# Patient Record
Sex: Male | Born: 1940
Health system: Southern US, Community
[De-identification: ages and names within clinical notes are randomized; demographics above are authoritative.]

## PROBLEM LIST (undated history)

## (undated) DIAGNOSIS — M1990 Unspecified osteoarthritis, unspecified site: Secondary | ICD-10-CM

## (undated) DIAGNOSIS — D649 Anemia, unspecified: Secondary | ICD-10-CM

## (undated) DIAGNOSIS — D696 Thrombocytopenia, unspecified: Secondary | ICD-10-CM

## (undated) DIAGNOSIS — Z9289 Personal history of other medical treatment: Secondary | ICD-10-CM

## (undated) DIAGNOSIS — I509 Heart failure, unspecified: Secondary | ICD-10-CM

## (undated) DIAGNOSIS — M545 Low back pain, unspecified: Secondary | ICD-10-CM

## (undated) DIAGNOSIS — I2510 Atherosclerotic heart disease of native coronary artery without angina pectoris: Secondary | ICD-10-CM

## (undated) DIAGNOSIS — K5790 Diverticulosis of intestine, part unspecified, without perforation or abscess without bleeding: Secondary | ICD-10-CM

## (undated) DIAGNOSIS — E538 Deficiency of other specified B group vitamins: Secondary | ICD-10-CM

## (undated) DIAGNOSIS — M79606 Pain in leg, unspecified: Secondary | ICD-10-CM

## (undated) DIAGNOSIS — E1140 Type 2 diabetes mellitus with diabetic neuropathy, unspecified: Secondary | ICD-10-CM

## (undated) DIAGNOSIS — I350 Nonrheumatic aortic (valve) stenosis: Secondary | ICD-10-CM

## (undated) DIAGNOSIS — Z8719 Personal history of other diseases of the digestive system: Secondary | ICD-10-CM

## (undated) DIAGNOSIS — I499 Cardiac arrhythmia, unspecified: Secondary | ICD-10-CM

## (undated) DIAGNOSIS — R011 Cardiac murmur, unspecified: Secondary | ICD-10-CM

## (undated) DIAGNOSIS — J309 Allergic rhinitis, unspecified: Secondary | ICD-10-CM

## (undated) DIAGNOSIS — U071 COVID-19: Secondary | ICD-10-CM

## (undated) DIAGNOSIS — I219 Acute myocardial infarction, unspecified: Secondary | ICD-10-CM

## (undated) DIAGNOSIS — I10 Essential (primary) hypertension: Secondary | ICD-10-CM

## (undated) DIAGNOSIS — Z87442 Personal history of urinary calculi: Secondary | ICD-10-CM

## (undated) DIAGNOSIS — G8929 Other chronic pain: Secondary | ICD-10-CM

## (undated) DIAGNOSIS — K863 Pseudocyst of pancreas: Secondary | ICD-10-CM

## (undated) DIAGNOSIS — R42 Dizziness and giddiness: Secondary | ICD-10-CM

## (undated) DIAGNOSIS — T8781 Dehiscence of amputation stump: Secondary | ICD-10-CM

## (undated) HISTORY — PX: CARPAL TUNNEL RELEASE: SHX101

## (undated) HISTORY — DX: Allergic rhinitis, unspecified: J30.9

## (undated) HISTORY — PX: COLONOSCOPY: SHX174

## (undated) HISTORY — DX: Personal history of other medical treatment: Z92.89

## (undated) HISTORY — DX: Diverticulosis of intestine, part unspecified, without perforation or abscess without bleeding: K57.90

## (undated) HISTORY — PX: BACK SURGERY: SHX140

## (undated) HISTORY — PX: KNEE ARTHROSCOPY: SHX127

## (undated) HISTORY — DX: Gilbert syndrome: E80.4

## (undated) HISTORY — PX: TONSILLECTOMY: SUR1361

## (undated) HISTORY — DX: Deficiency of other specified B group vitamins: E53.8

## (undated) HISTORY — DX: Type 2 diabetes mellitus with diabetic neuropathy, unspecified: E11.40

## (undated) HISTORY — DX: Personal history of other diseases of the digestive system: Z87.19

## (undated) HISTORY — PX: SHOULDER SURGERY: SHX246

## (undated) HISTORY — PX: CHOLECYSTECTOMY: SHX55

---

## 2001-01-09 ENCOUNTER — Encounter: Admission: RE | Admit: 2001-01-09 | Discharge: 2001-01-09 | Payer: Self-pay | Admitting: Internal Medicine

## 2001-01-09 ENCOUNTER — Encounter: Payer: Self-pay | Admitting: Internal Medicine

## 2001-02-24 ENCOUNTER — Encounter: Payer: Self-pay | Admitting: *Deleted

## 2001-02-24 ENCOUNTER — Ambulatory Visit (HOSPITAL_COMMUNITY): Admission: RE | Admit: 2001-02-24 | Discharge: 2001-02-24 | Payer: Self-pay | Admitting: *Deleted

## 2001-03-13 ENCOUNTER — Encounter: Admission: RE | Admit: 2001-03-13 | Discharge: 2001-03-13 | Payer: Self-pay | Admitting: *Deleted

## 2001-03-13 ENCOUNTER — Encounter: Payer: Self-pay | Admitting: *Deleted

## 2001-03-27 ENCOUNTER — Encounter: Payer: Self-pay | Admitting: *Deleted

## 2001-03-27 ENCOUNTER — Encounter: Admission: RE | Admit: 2001-03-27 | Discharge: 2001-03-27 | Payer: Self-pay | Admitting: *Deleted

## 2001-04-14 ENCOUNTER — Encounter: Admission: RE | Admit: 2001-04-14 | Discharge: 2001-04-14 | Payer: Self-pay | Admitting: *Deleted

## 2001-04-14 ENCOUNTER — Encounter: Payer: Self-pay | Admitting: *Deleted

## 2001-08-19 ENCOUNTER — Emergency Department (HOSPITAL_COMMUNITY): Admission: EM | Admit: 2001-08-19 | Discharge: 2001-08-19 | Payer: Self-pay | Admitting: Emergency Medicine

## 2001-08-19 ENCOUNTER — Encounter: Payer: Self-pay | Admitting: Emergency Medicine

## 2001-10-04 HISTORY — PX: INGUINAL HERNIA REPAIR: SUR1180

## 2001-10-09 ENCOUNTER — Encounter: Payer: Self-pay | Admitting: Internal Medicine

## 2001-10-09 ENCOUNTER — Encounter: Admission: RE | Admit: 2001-10-09 | Discharge: 2001-10-09 | Payer: Self-pay | Admitting: Internal Medicine

## 2001-11-10 ENCOUNTER — Encounter: Payer: Self-pay | Admitting: Surgery

## 2001-11-10 ENCOUNTER — Encounter: Admission: RE | Admit: 2001-11-10 | Discharge: 2001-11-10 | Payer: Self-pay | Admitting: Surgery

## 2001-11-13 ENCOUNTER — Ambulatory Visit (HOSPITAL_BASED_OUTPATIENT_CLINIC_OR_DEPARTMENT_OTHER): Admission: RE | Admit: 2001-11-13 | Discharge: 2001-11-14 | Payer: Self-pay | Admitting: Surgery

## 2002-01-25 ENCOUNTER — Encounter: Payer: Self-pay | Admitting: *Deleted

## 2002-01-25 ENCOUNTER — Encounter: Payer: Self-pay | Admitting: Internal Medicine

## 2002-01-25 ENCOUNTER — Observation Stay (HOSPITAL_COMMUNITY): Admission: EM | Admit: 2002-01-25 | Discharge: 2002-01-26 | Payer: Self-pay | Admitting: *Deleted

## 2002-04-03 ENCOUNTER — Inpatient Hospital Stay (HOSPITAL_COMMUNITY): Admission: RE | Admit: 2002-04-03 | Discharge: 2002-04-07 | Payer: Self-pay | Admitting: *Deleted

## 2002-04-03 HISTORY — PX: JOINT REPLACEMENT: SHX530

## 2003-01-08 ENCOUNTER — Encounter: Payer: Self-pay | Admitting: Internal Medicine

## 2003-01-08 ENCOUNTER — Inpatient Hospital Stay (HOSPITAL_COMMUNITY): Admission: EM | Admit: 2003-01-08 | Discharge: 2003-01-11 | Payer: Self-pay | Admitting: Emergency Medicine

## 2003-01-09 ENCOUNTER — Encounter: Payer: Self-pay | Admitting: Internal Medicine

## 2003-02-14 ENCOUNTER — Ambulatory Visit (HOSPITAL_COMMUNITY): Admission: RE | Admit: 2003-02-14 | Discharge: 2003-02-15 | Payer: Self-pay | Admitting: Interventional Cardiology

## 2003-07-23 ENCOUNTER — Encounter: Payer: Self-pay | Admitting: Emergency Medicine

## 2003-07-23 ENCOUNTER — Inpatient Hospital Stay (HOSPITAL_COMMUNITY): Admission: EM | Admit: 2003-07-23 | Discharge: 2003-07-31 | Payer: Self-pay | Admitting: Emergency Medicine

## 2003-08-11 ENCOUNTER — Inpatient Hospital Stay (HOSPITAL_COMMUNITY): Admission: EM | Admit: 2003-08-11 | Discharge: 2003-08-14 | Payer: Self-pay | Admitting: Emergency Medicine

## 2003-08-13 ENCOUNTER — Encounter (INDEPENDENT_AMBULATORY_CARE_PROVIDER_SITE_OTHER): Payer: Self-pay | Admitting: *Deleted

## 2004-07-17 ENCOUNTER — Encounter: Admission: RE | Admit: 2004-07-17 | Discharge: 2004-07-17 | Payer: Self-pay | Admitting: Internal Medicine

## 2004-10-27 ENCOUNTER — Encounter: Admission: RE | Admit: 2004-10-27 | Discharge: 2004-10-27 | Payer: Self-pay | Admitting: Orthopaedic Surgery

## 2004-11-16 ENCOUNTER — Encounter: Admission: RE | Admit: 2004-11-16 | Discharge: 2004-11-16 | Payer: Self-pay | Admitting: Orthopaedic Surgery

## 2004-12-15 ENCOUNTER — Encounter: Admission: RE | Admit: 2004-12-15 | Discharge: 2004-12-15 | Payer: Self-pay | Admitting: Internal Medicine

## 2005-01-21 ENCOUNTER — Encounter: Admission: RE | Admit: 2005-01-21 | Discharge: 2005-01-21 | Payer: Self-pay | Admitting: Orthopaedic Surgery

## 2005-02-21 ENCOUNTER — Ambulatory Visit (HOSPITAL_COMMUNITY): Admission: RE | Admit: 2005-02-21 | Discharge: 2005-02-21 | Payer: Self-pay | Admitting: Orthopaedic Surgery

## 2005-02-23 ENCOUNTER — Encounter: Admission: RE | Admit: 2005-02-23 | Discharge: 2005-02-23 | Payer: Self-pay | Admitting: General Surgery

## 2005-03-22 ENCOUNTER — Ambulatory Visit (HOSPITAL_COMMUNITY): Admission: RE | Admit: 2005-03-22 | Discharge: 2005-03-23 | Payer: Self-pay | Admitting: Orthopaedic Surgery

## 2005-05-07 ENCOUNTER — Inpatient Hospital Stay (HOSPITAL_COMMUNITY): Admission: RE | Admit: 2005-05-07 | Discharge: 2005-05-12 | Payer: Self-pay | Admitting: General Surgery

## 2005-05-07 ENCOUNTER — Encounter (INDEPENDENT_AMBULATORY_CARE_PROVIDER_SITE_OTHER): Payer: Self-pay | Admitting: Specialist

## 2005-07-09 ENCOUNTER — Encounter: Admission: RE | Admit: 2005-07-09 | Discharge: 2005-07-09 | Payer: Self-pay | Admitting: General Surgery

## 2005-07-12 ENCOUNTER — Ambulatory Visit (HOSPITAL_COMMUNITY): Admission: RE | Admit: 2005-07-12 | Discharge: 2005-07-12 | Payer: Self-pay | Admitting: Orthopaedic Surgery

## 2005-07-23 ENCOUNTER — Ambulatory Visit (HOSPITAL_COMMUNITY): Admission: RE | Admit: 2005-07-23 | Discharge: 2005-07-24 | Payer: Self-pay | Admitting: Orthopaedic Surgery

## 2007-06-10 ENCOUNTER — Ambulatory Visit (HOSPITAL_COMMUNITY): Admission: RE | Admit: 2007-06-10 | Discharge: 2007-06-10 | Payer: Self-pay | Admitting: Orthopaedic Surgery

## 2007-06-22 ENCOUNTER — Encounter: Admission: RE | Admit: 2007-06-22 | Discharge: 2007-06-22 | Payer: Self-pay | Admitting: Orthopaedic Surgery

## 2007-07-17 ENCOUNTER — Ambulatory Visit (HOSPITAL_COMMUNITY): Admission: RE | Admit: 2007-07-17 | Discharge: 2007-07-18 | Payer: Self-pay | Admitting: Orthopaedic Surgery

## 2007-09-18 ENCOUNTER — Encounter (INDEPENDENT_AMBULATORY_CARE_PROVIDER_SITE_OTHER): Payer: Self-pay | Admitting: Cardiology

## 2007-09-18 ENCOUNTER — Ambulatory Visit (HOSPITAL_COMMUNITY): Admission: RE | Admit: 2007-09-18 | Discharge: 2007-09-18 | Payer: Self-pay | Admitting: Cardiology

## 2007-09-18 ENCOUNTER — Ambulatory Visit: Payer: Self-pay | Admitting: *Deleted

## 2008-05-05 ENCOUNTER — Encounter: Admission: RE | Admit: 2008-05-05 | Discharge: 2008-05-05 | Payer: Self-pay | Admitting: Orthopaedic Surgery

## 2008-05-16 ENCOUNTER — Encounter: Admission: RE | Admit: 2008-05-16 | Discharge: 2008-05-16 | Payer: Self-pay | Admitting: Orthopaedic Surgery

## 2008-12-06 ENCOUNTER — Encounter: Admission: RE | Admit: 2008-12-06 | Discharge: 2008-12-06 | Payer: Self-pay | Admitting: Orthopaedic Surgery

## 2009-01-22 ENCOUNTER — Encounter: Admission: RE | Admit: 2009-01-22 | Discharge: 2009-04-22 | Payer: Self-pay | Admitting: Orthopaedic Surgery

## 2010-10-24 ENCOUNTER — Encounter: Payer: Self-pay | Admitting: Orthopaedic Surgery

## 2010-10-26 ENCOUNTER — Encounter: Payer: Self-pay | Admitting: Orthopaedic Surgery

## 2011-02-16 NOTE — Op Note (Signed)
NAME:  Keith Espinoza, Keith Espinoza                ACCOUNT NO.:  [id number]   MEDICAL RECORD NO.:  [id number]          PATIENT TYPE:  OIB   LOCATION:  5011                         FACILITY:  [facility]   PHYSICIAN:  Mark C. Ophelia Charter, M.D.    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   PROCEDURE:  Right L4-5 microdiskectomy for recurrent herniated nucleus  pulposus.   SURGEON:  Mark C. Ophelia Charter, MD   ANESTHESIA:  GOT.   ESTIMATED BLOOD LOSS:  Less than 100 mL.   ASSISTANT:  Maud Deed, PA-C   BRIEF HISTORY:  A 70 year old male status post microdiskectomy 2 years  ago.  He has been playing golf 3 days a week until 2 months ago, when he  developed severe progressive right buttock pain, right lower extremity  pain, anterior tibialis weakness, EHL weakness, and a myelogram CT scan  showed a large recurrent HNP at the level of the disk on the right side  causing pseudostenosis from a fragment greater than 1.5 cm.   PROCEDURE:  After the patient had the induction of anesthesia, the  patient was placed on chest rolls, prone position, careful padding and  positioning.  The back was prepped with DuraPrep, squared with towels,  Betadine Vi-Drape applied and a laminectomy sheet and drapes.  Old scar  was opened and subcutaneous tissue was subperiosteally dissected and a  Penfield #4 was placed down into the depths overlying the lamina level  and an x-ray was taken, which showed it was over the upper half of the  L4 pedicle above the space.  Exposure was adjusted and an old laminotomy  defect was visualized.  Bone was removed on the right at L4 and  inferiorly at L5 until normal dura was found in both areas, a patty used  to protect the dura and then following this from  cephalad to caudad  following along the lateral gutter, scar was separated from the bone.  Bone was removed further rout lateral along the edge of the pedicle  using the Karlin curettes for the microdissection.  Once the ligamentum  was elevated proximally in the area of normal dura, a ball-tip nerve  hook was used to gently separate the dura from the ligamentum, and it  was removed with a 2-mm Kerrison.  Microdissection techniques using a  Cushing 15 scalpel was used to excise chunks of scar tissue with normal  dura above and below.  Finally the floor was reached.  Disk space was  noted with the nerve root scarred down with the dura to the midline.  Annulus was incised with a stab and passes made with a micropituitary  and then a regular pituitary and initially a small amount of disk was  removed.  Suddenly over the next pass a massive fragment was removed,  which 2 x 1 cm, followed by another fragment about half that size,  and  then the nerve root was free.  Dura was able to be mobilized easily.  A  hockey stick was placed anterior to the dura.  The pocket where the  fragment was present was noted.  The edges were excised.  The nerve root  was still stuck down slightly distally; however, with dura decompressed,  the fragment removed, no areas of  remaining compression.  This area down distal was left.  Myelo CT showed  that there was no spurring causing compression and with the dura and  nerve root decompressed, the wound was irrigated.  The fascia was closed  with 0 Vicryl and 2-0 on the subcutaneous tissue and then a skin  closure.  Instrument count and needle count was correct.      Mark C. Ophelia Charter, M.D.  Electronically Signed     MCY/MEDQ  D:  07/17/2007  T:  07/18/2007  Job:  [id number]

## 2011-02-19 NOTE — H&P (Signed)
NAME:  KRISTOFF, COONRADT NO.:  [id number]   MEDICAL RECORD NO.:  [id number]                   PATIENT TYPE:  INP   LOCATION:  1826                                 FACILITY:  [facility]   PHYSICIAN:  W. Ashley Royalty., M.D.         DATE OF BIRTH:  January 14, 1941   DATE OF ADMISSION:  08/11/2003  DATE OF DISCHARGE:                                HISTORY & PHYSICAL   HISTORY OF PRESENT ILLNESS:  A 70 year old male who has a prior history of  coronary artery disease. Earlier this year, he presented with unstable  angina and had drug eluding Cipher stents placed to the mid right coronary  artery, LAD and circumflex by Dr. Verdis Prime. The date of the last Stent  implantation in the LAD and the circumflex was Feb 14, 2003. He also has  hypertension, diabetes, dyslipidemia, obesity, osteoarthritis and gout. He  was admitted 3 weeks ago with gallstone pancreatitis. The pancreatitis was  thought to be resolved but he was very weak and he was discharged on July 31, 2003 to have a laparoscopic cholecystectomy when he was stronger. He was  taken off of Plavix during the most recent hospitalization in anticipation  of upcoming surgery. After breakfast this morning, he developed lower  sternal and right pain which is described as a pressure feeling associated  with diaphoresis lasting around 20 minutes. It was different in location  from his previous coronary pain, which was more mid sternal. This seems to  more localize to the upper right upper quadrant and lower sternal area and  lasts around 20 minutes. He took nitroglycerin and came to the emergency  room where he was having vague symptoms. Initial enzymes and  electrocardiogram  were unremarkable. The cardiologist was called and he was  admitted to rule out  a myocardial infarction.   PAST MEDICAL HISTORY:  Remarkable for hypertension, hyperlipidemia, type 2  diabetes, obesity, gout and osteoarthritis as well as  nephrolithiasis.   PAST SURGICAL HISTORY:  He has had a hip arthroplasty as well as a hernia  repair. He has also had arthroscopic knee surgery previously.   ALLERGIES:  None.   CURRENT MEDICATIONS:  Pravachol 80 daily, Lotensin 10 daily, Allopurinol 300  mg daily, Amaryl 4 mg daily, Hytrin 5 mg daily, Micardis 80 mg daily, and  Nitroglycerin p.r.n.   SOCIAL/FAMILY HISTORY:  Reviewed from an old record dated October 18,2004.  Reviewed and are unchanged.   REVIEW OF SYSTEMS:  His weight has been stable. He has no known eye, ear,  nose or throat problems. He has no difficulty swallowing. He has had some  mild dyspepsia. Denies any urinary symptoms. He has had significant  osteoarthritis noted in the past. He has had no syncope, no TIA, or  neurologic symptoms.   PHYSICAL EXAMINATION:  GENERAL:  A pleasant, obese male who is currently in  no acute distress.  VITAL SIGNS:  Blood pressure 100/60. Pulse was 80.  SKIN:  Warm and dry.  HEENT:  EOMI. PERRLA. CNS clear. Fundi unremarkable. Pharynx negative.  NECK:  Supple without masses, JVD, thyromegaly or bruits.  LUNGS:  Clear to auscultation and percussion.  CARDIAC:  Normal S1 and S2. No S3. No S4. No murmur.  ABDOMEN:  Soft. There is mild right upper quadrant tenderness noted without  rebound.  EXTREMITIES:  Femoral distal pulses are 2+. There was no edema noted.   LABORATORY DATA:  A 12 lead EKG was normal.   Lab data unremarkable except for a glucose of 239. Amylase was normal and  liver enzymes were normal. CBC was not done.   IMPRESSION:  1. Chest pain rule out  unstable angina pectoris or myocardial infarction,     rule out  atypical presentation of gallbladder disease.  2. Coronary disease with previous Stenting of the LAD, circumflex, and     obtuse marginal arteries and right coronary arteries.  3. Type 2 diabetes mellitus.  4. Hypertension.  5. Gout.  6. Osteoarthritis.   RECOMMENDATIONS:  Internal medicine  consult. Possible general surgery  consult. Check serial enzymes and EKG. He was taken off of Plavix recently  and will defer further workup to Dr. Verdis Prime.                                                Darden Palmer., M.D.    WST/MEDQ  D:  08/11/2003  T:  08/11/2003  Job:  [id number]   cc:   Candyce Churn, M.D.  [address] [place]  [place] [address]  Fax: [phone]   Lyn Records III, M.D.  [address]  [place]  [place] [address]  Fax: [phone]   Adolph Pollack, M.D.  [address]  [place]  [place] [address]  Fax: [phone]

## 2011-02-19 NOTE — Discharge Summary (Signed)
NAME:  Keith Espinoza, Keith Espinoza NO.:  [id number]   MEDICAL RECORD NO.:  [id number]                   PATIENT TYPE:  INP   LOCATION:  5011                                 FACILITY:  [facility]   PHYSICIAN:  Reynolds Bowl                       DATE OF BIRTH:  1940/12/19   DATE OF ADMISSION:  04/03/2002  DATE OF DISCHARGE:  04/07/2002                                 DISCHARGE SUMMARY   ADMITTING DIAGNOSES:  Osteoarthritis right hip, hypertension, and gout.   DISCHARGE DIAGNOSES:  Unchanged.   OPERATIVE PROCEDURE:  April 03, 2002:  Press-Fit right total hip arthroplasty  as detailed in the operative note.   HISTORY AND PHYSICAL:  For history and physical see that dictated on  admission.   HOSPITAL COURSE:  On admission the patient was taken to the operating room  where he underwent right total hip arthroplasty as detailed in the operative  note.  Pre and postoperatively he was on prophylactic antibiotics.  He was  begun on prophylactic Coumadin postoperatively.  His immediate postoperative  neurologic status was normal.  The first day postoperatively his hemoglobin  was 10.0.  His drain was removed.  Foley catheter removed.  He was begun on  50% weightbearing.  On July 5 his wound was fine.  He had excellent leg  control.  He was ambulatory.  He was discharged to home for home physical  therapy.  Continue Coumadin per protocol.  Take oral ferrous sulfate and I  would see him in my office in approximately a week.   LABORATORY DATA:  Admission hemoglobin 13.5.  Discharge hemoglobin 8.8.  Routine chemistries were normal except initial glucose 162 and secondary  glucose 196.  His liver enzymes were normal.                                                 Reynolds Bowl    JK/MEDQ  D:  04/30/2002  T:  05/02/2002  Job:  [id number]

## 2011-02-19 NOTE — Consult Note (Signed)
NAME:  Keith Espinoza NO.:  [id number]   MEDICAL RECORD NO.:  [id number]                   PATIENT TYPE:  INP   LOCATION:  2904                                 FACILITY:  [facility]   PHYSICIAN:  Adolph Pollack, M.D.            DATE OF BIRTH:  04-13-1941   DATE OF CONSULTATION:  07/25/2003  DATE OF DISCHARGE:                                   CONSULTATION   .   REASON FOR CONSULTATION:  Biliary pancreatitis.   HISTORY OF PRESENT ILLNESS:  [name] is a 70 year old male who was in his  usual state of health until date of admission.  He had a slightly fatty meal  and began having nausea, vomiting and severe abdominal pain.  He presented  to the emergency department with those complaints.  At that time, he was  noted to have elevation of white blood cell count, liver function tests, as  well as significant elevation of his amylase and lipase with lipase of  11,882 and amylase of 4636.  He subsequently was admitted.   HOSPITAL COURSE:  He underwent a CT scan which demonstrated gallstones.  No  gallbladder wall thickening and some peripancreatic edematous changes  consistent with acute pancreatitis.  His white blood cell count peaked at  22,000 on July 24, 2003.  His amylase and lipase have been coming down.  He is still having some tenderness, but is beginning a little more  comfortable.  He feels distended, but does not have any nausea.  He denies  having anything like this before.  He currently has been treated with some  bowel rest although he started on a diet recently and some IV Unasyn  empirically.   PAST MEDICAL HISTORY:  1. Hypertension.  2. Coronary artery disease, status post stent placement.  3. Type 2 diabetes mellitus.  4. Dyslipidemia.  5. Obesity.  6. Lumbar degenerative joint disease.  7. Nephrolithiasis.  8. Gout.   PREVIOUS OPERATIONS:  1. Left knee arthroscopy.  2. Right inguinal hernia repair.  3. Right total hip  replacement.   ALLERGIES:  None known.   CURRENT MEDICATIONS:  1. Lotensin.  2. Amaryl.  3. Plavix.  4. Hytrin.  5. Allopurinol.  6. Hydrochlorothiazide.  7. Avapro.  8. Dilaudid p.r.n.  9. Protonix.  10.      Unasyn.   SOCIAL HISTORY:  He chews tobacco. Denies alcohol use.  He is married.   FAMILY HISTORY:  Father died of prostate cancer at older age and mother died  at very old age as well.  There is hypertension and diabetes in the family.   REVIEW OF SYSTEMS:  CARDIOVASCULAR:  He denies any recent chest pain.  PULMONARY:  He does report having some shortness of breath with exertion at  times.  GI:  He denies peptic ulcer disease, hepatitis or diverticular  disease.  GU:  Has kidney stones as above.  HEMATOLOGIC:  He denies any known  bleeding disorders, blood clots or previous transfusions.   PHYSICAL EXAMINATION:  GENERAL:  An elderly male who appears to be somewhat  uncomfortable.  He is currently complaining of some back pain.  VITAL SIGNS:  His maximum temperature over the past 24 hours has been 101  degrees.  HEENT:  Eyes:  Extraocular motions intact. No icterus noted.  RESPIRATORY:  Breath sounds are equal and clear.  ABDOMEN:  Soft and distended and tympanitic.  There is a moderate epigastric  tenderness to palpation as well.  No palpable masses or hernias are noted.   LABORATORY DATA:  Demonstrates a white blood cell count 14,900 with a  hemoglobin 12.9, platelet count 97,000.  Amylase down to 553, lipase 129.  Total bili 1.9.  No other elevated liver function tests.   IMPRESSION:  1. Acute biliary pancreatitis- seems to be slowly improving clinically     although does have some side effect of ileus and has some peripancreatic     inflammatory changes on CT scan.  2. Diabetes, hypertension, coronary artery disease, status post stent     placement.  3. Thrombocytopenia, question etiology.   RECOMMENDATIONS:  Agree with empiric antibiotics.  I would be slow  to  advance his diet as he seems to be slow to resolve.  If seems to have fairly  good recovery here in the hospital, we would consider laparoscopic  cholecystectomy while he is here.  If he needs more time out of the  hospital, we will let him convalesce at home then proceed with outpatient  laparoscopic cholecystectomy.  I did go over the procedure and the risks  including; not limited to bleeding, infection, bowel injury, hepatic injury,  bile leak, small bowel entry, cardiopulmonary complication of anesthesia and  postprandial diarrhea after cholecystectomy.   Also of note, is that he has had the recent stent placement sometime in the  spring of this year and I wonder if would need a functional study/stress  test before he would have operative intervention.  I will discuss this with  Dr. Kevan Ny.                                                 Adolph Pollack, M.D.    Keith Espinoza  D:  07/25/2003  T:  07/25/2003  Job:  [id number]   cc:   Everardo All. Madilyn Fireman, M.D.  [address]  [place]  [place] [address]  Fax: [phone]

## 2011-02-19 NOTE — Op Note (Signed)
[place]. [facility]  Patient:    Keith Espinoza, Keith Espinoza Visit Number: [id number] MRN: [id number]          Service Type: EMS Location: [facility] Attending Physician:  Hanley Seamen Dictated by:   Velora Heckler, M.D. Proc. Date: 11/13/01 Admit Date:  08/19/2001 Discharge Date: 08/19/2001   CC:         Pearla Dubonnet, M.D.  Reynolds Bowl, M.D.   Operative Report  PREOPERATIVE DIAGNOSIS:  Right inguinal hernia, reducible.  POSTOPERATIVE DIAGNOSIS:  Right inguinal hernia, reducible.  OPERATION/PROCEDURE:  Repair of right inguinal hernia with Prolene mesh.  SURGEON:  Velora Heckler, M.D.  ANESTHESIA:  General.  ESTIMATED BLOOD LOSS:  Minimal.  PREPARATION:  Betadine.  COMPLICATIONS:  None.  INDICATIONS:  The patient is a 70 year old white male from [place], [place] who presents with right inguinal hernia.  This has been present for several months and is causing him progressive discomfort.  The patient now comes to surgery for elective repair.  DESCRIPTION OF PROCEDURE:  The procedure is done in OR #2 at the [facility].  The patient was brought to the operating room, placed in the supine position on the operating room table.  Following administration of general anesthesia, the patient was prepped and draped in the usual strict aseptic fashion.  After ascertaining an adequate level of anesthesia had been obtained, a right inguinal incision was made with a #10 blade.  Dissection was carried down to the subcutaneous tissues which were quite extensive due his size.  Dissection was carried down to the abdominal wall and hemostasis was obtained with the electrocautery.  External oblique fascia was incised in line with its fibers and extended through the external inguinal ring.  A large direct inguinal hernia is present.  Cord structures are encircled with a Penrose drain.  The direct inguinal hernia sac is dissected away from the  cord structures up to level of the abdominal wall.  It is reduced back within the peritoneal cavity.  It is held in reduction with interrupted 3-0 Vicryl sutures.  Cord is explored.  No evidence of indirect inguinal hernia sac is identified.  Floor of the inguinal canal is then recreated with a sheet of prolene mesh.  The mesh is secured to the pubic tubercle and along the inguinal ligament with a running 2-0 Novofil suture.  Mesh was split to accommodate the cord structures.  Superior margin of the mesh is secured to the transversalis and internal oblique fascia with interrupted 2-0 Novofil sutures.  Tails of the mesh are overlapped lateral to the cord structures and the inferior edges are secured to the inguinal ligament with interrupted 2-0 Novofil sutures.  Local field block is placed with Marcaine.  Cord structures are returned to the inguinal canal.  External oblique fascia is closed with interrupted 3-0 Vicryl sutures.  Subcutaneous tissues are closed with interrupted 3-0 Vicryl sutures.  Skin edges are anesthetized with local Marcaine anesthetic.  Skin edges are closed with interrupted 4-0 Vicryl subcuticular sutures.  Wound is washed and dried and Benzoin and Steri-Strips are applied.  Sterile gauze dressings are applied.  The patient is awakened from anesthesia and brought to the recovery room in stable condition.  The patient tolerated the procedure well. Dictated by:   Velora Heckler, M.D. Attending Physician:  Hanley Seamen DD:  11/13/01 TD:  11/13/01 Job: [id number]

## 2011-02-19 NOTE — H&P (Signed)
[place]. [facility]  Patient:    Keith Espinoza, Keith Espinoza Visit Number: [id number] MRN: [id number]          Service Type: SUR Location: 5000 [id number] Attending Physician:  Maryanna Shape Dictated by:   Reynolds Bowl, M.D. Admit Date:  04/03/2002                           History and Physical  PREOPERATIVE DIAGNOSIS:  Osteoarthritis, right hip.  POSTOPERATIVE DIAGNOSIS:  Osteoarthritis, right hip.  PROCEDURE:  Press-fit right total hip arthroplasty.  ANESTHESIA:  General.  SURGEON:  Reynolds Bowl, M.D.  ASSISTANT:  Colon Flattery. Ollen Bowl, M.D.  DESCRIPTION OF PROCEDURE:  The patient had an IV started, was given 2 g of Ancef IV, was given a general anesthetic, Foley catheter put in place, and he was positioned in the lateral position with the right side up with an axillary roll in place, and he was stabilized with the [place] frame.  The hip and inguinal area was isolated using the U-drape.  Then he was prepped with Duraprep from the tips of the toes to and including the edges of the U-drape, and then used the usual drape, which included two Iobans.  He was entered through a straight posterolateral incision, went down through a few inches of adipose tissue until we got to the level of the trochanteric bursa.  Soft tissues were then pushed posteriorly.  The IT band was split over a couple of inches, then proximally the gluteus maximus muscle fibers were digitally separated.  We then exposed the external rotators, which were tagged and retracted, which exposed the capsule of the hip.  This was fairly difficult, as his tissues were very tight and firm.  Once these were exposed, I put in a blunt retractor around the neck below and above and then digitally identified the sciatic nerve but did not expose it.  The capsule was then opened in T fashion.  I found it to be very thick and very difficult to mobilize, but it was retracted out of the way and then a huge  labrum was excised and a huge mass of soft tissue from pulvinar was removed.  Then with butterfly retractors in place and with everybody cognizant of the sciatic nerve, the acetabulum was reamed sequentially in nearly 40 degrees of abduction and about 25 degrees of anteversion.  We reamed up to size 52 and then with the trial in place decided that should make a good fit, and therefore implanted the Trident TSL acetabular shell 52E, and we then placed a routine cup liner.  Attention was then directed to the femoral shaft.  The femoral shaft was sequentially reamed to accept stem size #9 and then rasped the same.  For the distal fit of the femoral stem, I sequentially reamed up to 15.5 mm.  It should be mentioned that the neck was cut to be a 14, about 14-15 mm in length by direct measurement, and at this point we placed trials and it felt like the +10 femoral neck was going to be a little bit loose and would not allow for use of ceramic components.  We then used the 10 degree eccentric cup insert trial, which then allowed the use of a +10 C-taper head, which gave excellent stability and motion.  Therefore, at this point we removed the trial eccentric cup insert 32E and inserted the permanent cup liner after, of course, installing  the hole eliminator in the acetabular shell.  We then implanted the _____ Secure Plus hip stem #9, which hammered in quite snugly. We then did trial reductions, hoping that the +5 would fit well; however, I felt there was a slight more laxity than we should leave with.  Therefore, we tried the +10 and that gave [place] excellent stability and motion.  Therefore, we dried the Morse taper head and neck and impacted the permanent 32-10 C-taper head.  At this point, again with reduction, there was excellent snugness, the hip would flex above 90, adduct, and had to be markedly internally rotated to dislocate.  You could not dislocate by hyperextension and had wide  abduction.  We then closed the joint in layers using #1 Vicryl to repair the capsule to itself.  I tagged the piriformis to the capsule, as it would not reach the base of the trochanter.  I tagged the other external rotators, including the quadratus, to soft tissue along the posterior edge of the femur so as to close dead space.  The iliotibial band was approximated with #1 Vicryl sutures in figure-of-eight fashion.  The myofascia of the gluteus maximus was approximated with 2-0 Vicryl.  Subcutaneous tissues were approximated with 2-0 Vicryls, and the skin edges held in apposition with metal staples.  Skin was dressed with 4 x 4s and ABD taped in place.  One suction Hemovac was placed just superficial to the IT band and brought out superolaterally.  Throughout the procedure the hip was frequently irrigated with pulsatile lavage, and throughout the procedure before we were able to seat the permanent implants, the cancellous bone of the acetabulum and the femoral canal continued to bleed throughout.  Once the components were in place, the hip joint area was quite dry.  As a result of all above, we estimated we lost about 800 cc and made plans on giving blood in the recovery room. Dictated by:   Reynolds Bowl, M.D. Attending Physician:  Maryanna Shape DD:  04/03/02 TD:  04/05/02 Job: [id number]

## 2011-02-19 NOTE — Discharge Summary (Signed)
NAME:  Keith Espinoza, Keith Espinoza NO.:  [id number]   MEDICAL RECORD NO.:  [id number]                   PATIENT TYPE:  INP   LOCATION:  6527                                 FACILITY:  [facility]   PHYSICIAN:  Candyce Churn, M.D.          DATE OF BIRTH:  Oct 23, 1940   DATE OF ADMISSION:  01/07/2003  DATE OF DISCHARGE:  01/11/2003                                 DISCHARGE SUMMARY   ADMISSION DIAGNOSES:  1. Chest pains/abnormal EKG, rule out myocardial infarction.  2. Hypertension.  3. Hyperglycemia.  4. Tobacco use (chew).  5. Dietary indiscretion.   DISCHARGE DIAGNOSES:  1. Chest pain, resolved, acute coronary syndrome.  2. Multivessel coronary artery disease, status post PCI RCA, four staged     circumflex and left anterior descending intervention.  3. Hyperglycemia - adult onset diabetes mellitus.  Amaryl started.  4. Hypokalemia, treated.  5. Transient trigeminy, resolved.  6. Hypertension.  7. Tobacco use (chew).  8. Dietary indiscretion.   HISTORY OF PRESENT ILLNESS:  Keith Espinoza is a 70 year old married white male  patient of Dr. Kevan Ny who presented to the emergency room after experiencing  nausea, diaphoresis, weakness, and substernal chest pain.  He had chest pain  on and off for about a month.  Around 6 p.m. on the evening of admission, he  experienced weakness and diaphoresis (diffuse), and around 9 p.m. developed  worsening weakness with chest pain and nausea, but no emesis.  While at  home, blood pressure was low at 84/47.   In the emergency room, blood pressure was 93/50, COR tachycardic, regular  rhythm with a 2/6 systolic ejection murmur.  Potassium was 3.4, BUN 23,  creatinine 1.9.  CK 134, MB 3.5, and troponin 0.1.  EKG showed a complete  left bundle branch block, sinus tachycardia, nonspecific ST-T wave changes.   The patient will be admitted for chest pain, rule out MI.  Will cycle  cardiac enzymes.  Will start Lovenox and aspirin.   Will follow blood  pressure.  Will hold antihypertensive medications until blood pressure  improves.  With reference to the hyperglycemia, will hydrate and follow  CBGs.  Will start Amaryl.  He does have a history of glucose intolerance.   PROCEDURES:  1. Adenosine Cardiolite 01/09/03.  2. Cardiac catheterization 01/10/03 - diagnostic by Dr. Fraser Din.  Intervention     by Dr. Katrinka Blazing.   COMPLICATIONS:  None.   CONSULTATIONS:  Cardiology for abnormal cardiac enzymes and chest pain.   COURSE IN HOSPITAL:  Keith Espinoza was admitted to [facility] on  01/08/03 with symptoms of acute coronary syndrome.  He was started on Lovenox  in the emergency room.  Cardiac enzymes were cycled.  Sublingual  nitroglycerin was ordered for p.r.n. use.  Aspirin therapy was initiated.  The patient was put on insulin sliding scale for control of diabetes.   LABORATORY STUDIES:  On admission, studies revealed a  sodium of 137,  potassium 3.4, BUN 23, and creatinine 1.9.  Glucose was markedly elevated at  434.  WBCs 7.4, hemoglobin 12.6, and platelet 116.  Cardiac enzymes - CK  134, 140, 118, 95.  MB 3.5, 4.6, 4.2, 3.2.  Troponin I 0.01, 0.05, 0.05,  0.03.  Total cholesterol 132, triglycerides 192, HDL 29, and LDL 65.   PROBLEM LIST:  Problem 1.  Hypoglycemia.  The patient was treated for  hyperglycemia with Amaryl and insulin sliding scale.  During the  hospitalization, blood sugars became under better control, staying in the  170 range.  Amaryl was increased to 4 mg a day on 01/10/03.  The patient was  seen by the diabetic educator, and they recommended outpatient diabetes  education, which has been ordered.  They also requested a prescription for  Lantus and test strips so the patient can use his wife's Glucometer at home  to follow his blood sugars.  Problem 2.  Acute coronary syndrome.  The patient had weakly positive  troponin and MB fractions.  He also had an abnormal EKG upon admission which  revealed  nonspecific ST-T wave changes in the inferolateral leads.  A  cardiology consult was obtained on 01/08/03 with [facility], Dr.  Fraser Din.  Based on her examination, she recommended proceeding with  adenosine Cardiolite.  Of note, the patient continued to have mild  substernal chest pain at rest lasting 2-3 minutes at a time and resolving  spontaneously.  The adenosine Cardiolite was performed on 01/09/03, and during  adenosine injection, the patient had typical symptoms of chest tightness,  body tingling.  He did have some trigeminy and unifocal PVCs, as well as  horizontal and downsloping ST changes in V2 through V6.  Blood pressure  remained stable during the study, and symptoms resolved, and EKG came back  to baseline during recovery.  Cardiolite images revealed  inferior/inferolateral defect with minimal reversal.  EF was 55%.  Dr.  Fraser Din discussed risks, benefits, and options with the patient and his  family.  They agreed to proceed with diagnostic cardiac catheterization.  On  01/10/03, BUN was 14, creatinine 1.0.  Potassium was low at 3.4 and was  repleted.  The patient was taken to the cardiac catheterization lab by Dr.  Fraser Din.  This revealed aortic pressure of 144/88, and LV of 143/18.  Normal  wall motion.  EF 65%.  No MR.  The left main was a short vessel.  The LAD  had moderate diagonal one disease.  A 50% mid-LAD lesion and distal tapering  of the LAD.  The circumflex had an OM-1 and OM-2 lesion.  There was a 60-70%  mid-circumflex lesion.  The distal circumflex had diffuse disease of 50-60%.  The RCA was a dominant vessel with discrete 80-90% proximal mid-lesion.  Dr.  Fraser Din reviewed the films with Dr. Katrinka Blazing, and they decided to proceed with  percutaneous intervention to the RCA.  Dr. Katrinka Blazing performed intervention to  the RCA lesion, which she felt was about 95%.  A 3.0 x 13 cipher stent was  placed, reducing the lesion to 0%.  TIMI-3 flow was maintained.  She recommended  bolus of infusion of Integrelin for 18 hours.  She would  recommend LAD followed by circumflex percutaneous intervention in the next  couple of weeks.  Problem 3.  Hypokalemia - repleted in the hospital.  BMET will be checked  prior to discharge.  Problem 4.  Low HDL, elevated triglycerides - total cholesterol was  132,  triglycerides 192, HDL 29, and LDL 65.  Low-dose statin therapy was  recommended, Lipitor 10 mg one-half tablet a day.  We made the addition of  Niaspan to bring his HDL's up.  We will need to watch blood sugars closely.  This patient would benefit from a LipoMed profile to define particle size.  Problem 5.  Relative hypotension.  The patient had hypotension upon  admission to the hospital.  This improved with hydration and medication  adjustment.  At the time of discharge, he was hemodynamically stable with a  blood pressure of 120/60.  Problem 6.  Trigeminy.  No recurrent episodes during the afternoon of  01/10/03.   Assuming the patient remains stable overnight, he will be discharged to home  on 01/11/03 with plans for outpatient follow up with Dr. Kevan Ny and Dr.  Fraser Din.   DISCHARGE MEDICATIONS:  1. Lotensin 10 mg daily.  2. Amaryl 4 mg daily (new).  3. Enteric-coated aspirin 325 mg daily.  4. Plavix 75 mg daily (new).  5. Hytrin 5 mg daily.  6. Allegra 180 mg daily.  7. Micardis 80/12.5 mg daily.  8. Nitroglycerin 0.4 mg one under the tongue every five minutes as needed     for chest pain.  Call 911 if no relief after three.  9. Lipitor 10 mg one-half tablet at night (new).  10.      Tylenol as needed for pain as directed on the bottle.   ACTIVITY:  No strenuous activity, lifting more than five pounds, or driving  for two days.  No strenuous activity (i.e. shoveling, raking, pushing mower,  etc.) until released by Dr. Fraser Din.  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   DIET:  Carbohydrate modified heart healthy diet.  Will recommend outpatient  diabetes education.  Dr. Kevan Ny  recommends that the patient stop by the  office for a diabetic booklet.   The patient may shower.   He is asked to call the office for any problems or questions.   FOLLOW UP:  1. A follow up appointment has been scheduled with Dr. Fraser Din for     Wednesday, 01/23/03 at 10 o'clock.  2. He will follow up with Dr. Kevan Ny late next week.  I have left a message     on his nurse, Andrea's, voice mail to call the patient with an     appointment.     Georgiann Cocker Jernejcic, P.A.                   Candyce Churn, M.D.    TCJ/MEDQ  D:  01/10/2003  T:  01/12/2003  Job:  [id number]   cc:   Candyce Churn, M.D.  [address] [place]  [place] [address]  Fax: [phone]   Meade Maw, M.D.  [address]  [place]  [place] [address]  Fax: [phone]

## 2011-02-19 NOTE — Discharge Summary (Signed)
NAME:  Keith Espinoza, Keith Espinoza NO.:  [id number]   MEDICAL RECORD NO.:  [id number]                   PATIENT TYPE:  OIB   LOCATION:  6533                                 FACILITY:  [facility]   PHYSICIAN:  Lyn Records, M.D.                DATE OF BIRTH:  November 10, 1940   DATE OF ADMISSION:  02/14/2003  DATE OF DISCHARGE:  02/15/2003                                 DISCHARGE SUMMARY   ADMISSION DIAGNOSES:  1. Residual coronary artery disease, for elective PCI.  2. Recent right coronary artery PCI with cipher stent.  3. Diabetes.  4. Hypertension.  5. Dyslipidemia.  6. Obesity.   DISCHARGE DIAGNOSES:  1. Coronary disease, status post PCI mid circumflex and left anterior     descending with cipher stents.  2. Recent right coronary artery PCI with cipher stent.  3. Diabetes.  4. Hypertension.  5. Dyslipidemia.  6. Obesity.   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  He recently  underwent cardiac catheterization in 4/04 for chest pain syndrome, and had a  stent placed in the right coronary artery (cipher).  He had residual LAD and  circumflex disease.   The patient is now ready for intervention to the LAD and circumflex vessels  as a staged procedure.   PROCEDURES:  Cardiac catheterization with intervention to the LAD and  circumflex by Dr. Katrinka Blazing.   COMPLICATIONS:  None.   CONSULTATIONS:  None.   COURSE IN HOSPITAL:  Mr. Beale was admitted to [facility] on  02/14/03 for elective intervention to the LAD and circumflex.  Pre-procedure  laboratory studies showed a hemoglobin of 12.1, platelets 107, INR 1.1,  potassium 3.5, BUN 19, creatinine 1.0.  The patient was taken to the cardiac  catheterization lab by Dr. Katrinka Blazing.  He proceeded with intervention to the LAD  and circumflex - the LAD went from 85% mid to 0% using a cipher 2.5 x 18 mm  stent - TIMI free  flow.  The circumflex lesion of 90% was reduced to 0% with  a 2.0 x 18 mm cipher stent - TIMI free flow.  The RCA stent was widely  patent.   An Angiomax was used during the procedure because of his low beginning  platelet count.   The patient tolerated the procedure well.   On 02/15/03, the patient remained stable.  The right groin with 1+  ecchymosis, but no bruit or hematoma.  No chest pain.   The patient will be discharged to home today in stable condition.   DISCHARGE MEDICATIONS:  1. Lotensin 10 mg.  2. Micardis 50/12.5 mg daily.  3. Amaryl 4 mg.  4. Lipitor 10 mg.  5. Hytrin 5 mg.  6. Plavix 75 mg daily for six months.  7. Enteric-coated aspirin 325 mg.  8.  Nitroglycerin as needed for chest pain.   ACTIVITY:  No strenuous activity, lifting, or driving for two days.  May  shower.   DIET:  Low-fat, low-cholesterol, low-salt diabetic diet.   He is to call the office if any problems or questions.   FOLLOW UP:  Follow up with Dr. Fraser Din on Tuesday, 03/05/03, at 1 o'clock.     Georgiann Cocker Jernejcic, P.A.                   Lyn Records, M.D.    TCJ/MEDQ  D:  02/15/2003  T:  02/16/2003  Job:  [id number]   cc:   Candyce Churn, M.D.  [address] [place]  [place] [address]  Fax: [phone]   Meade Maw, M.D.  [address]  [place]  [place] [address]  Fax: [phone]

## 2011-02-19 NOTE — Discharge Summary (Signed)
NAMEELSTON, ALDAPE NO.:  [id number]   MEDICAL RECORD NO.:  [id number]          PATIENT TYPE:  INP   LOCATION:  1403                         FACILITY:  [facility]   PHYSICIAN:  Sharlet Salina T. Espinoza, M.D.DATE OF BIRTH:  1941-08-12   DATE OF ADMISSION:  05/07/2005  DATE OF DISCHARGE:  05/12/2005                                 DISCHARGE SUMMARY   DISCHARGE DIAGNOSIS:  Pancreatic pseudocyst.   OPERATIONS/PROCEDURES:  Drainage of pancreatic pseudocyst with  cystogastrostomy Dr. Johna Sheriff May 07, 2005.   HISTORY OF PRESENT ILLNESS:  Keith Espinoza is a 70 year old male with a  significant history of laparoscopic cholecystectomy followed by an episode  of gallstone pancreatitis in November 2004.  CT scan at that time just  showed some mild edema of the pancreas or other complication.  The patient  did very well with no abdominal complaints until recently.  He now has had  insidious onset of upper abdominal pain for about 5-6 months.  It has  gradually worsened.  He describes a pressure-like sensation.  He was seen by  Dr. Johnella Moloney and a CT scan of the abdomen was obtained which has  revealed pancreatic pseudocysts as described below.   PAST MEDICAL HISTORY:  1.  Laparoscopic cholecystectomy 2004.  2.  Right inguinal hernia repair Dr. Gerrit Friends in 2003.  3.  He has had arthroscopy and total hip replacement.  Medically he is followed for gout, diabetes, hypertension, coronary artery  disease status post stent placement.  He also has DJD of the lumbar spine,  history of nephrolithiasis and dyslipidemia.   MEDICATIONS ON ADMISSION:  1.  Allopurinol 300 mg daily.  2.  Lotensin 10 daily.  3.  Micardis/HCT 80/12.5 daily.  4.  Allegra p.r.n.  5.  Enteric-coated aspirin 325 mg daily stopped one week ahead of time.  6.  Plavix 75 daily stopped one week ahead of time.  7.  Insulin 25 units in the morning.  8.  Glucophage 1000 mg b.i.d.  9.  Amaryl 5 mg b.i.d.   NO KNOWN  ALLERGIES.   Social history, family history, review of systems pertinent for no alcohol  use.   PHYSICAL EXAMINATION:  Height 5 foot 10 inches, 211 pounds, blood pressure  all normal.  ABDOMEN:  Shows no tenderness and no appreciable masses.   CT scan of the abdomen has revealed an 11 cm pseudocyst in the lesser sac  abutting the posterior gastric wall and a 4-5 cm pseudocyst in the head of  the pancreas which possibly could communicate.   HOSPITAL COURSE:  The patient was admitted on the morning of the procedure  and underwent a cyst gastrostomy to drain the large pseudocyst in the body  of the pancreas.  Pseudocyst from the head of the pancreas did not appear to  communicate.  It was in a very difficult position for drainage and being  just about 4 cm I elected to aspirate this and it was aspirated completely.  The patient tolerated the procedure well.  He had no particular  complications.  NG tube was left in place  for about three days due to ileus.  It was removed after three days and he was begun on a clear liquid diet  which he tolerated well and was able to be rapidly advanced to a regular  diet.  He was felt ready for discharge on the 5th postoperative day May 12, 2005.  The abdomen was soft and nontender.  He tolerated a regular diet.  He is back on his regular medications.  Biopsy of the pseudocyst wall was  consistent with benign pancreatic pseudocyst.  Followup is to be in my  office in approximately two weeks.      Keith Espinoza, M.D.  Electronically Signed     BTH/MEDQ  D:  05/24/2005  T:  05/24/2005  Job:  [id number]   cc:   Candyce Churn, M.D.  [address] [place]  [place] [address]  Fax: [phone]   Meade Maw, M.D.  [address]  [place]  [place] [address]  Fax: [phone]

## 2011-02-19 NOTE — Discharge Summary (Signed)
NAME:  Keith Espinoza, Keith Espinoza NO.:  [id number]   MEDICAL RECORD NO.:  [id number]                   PATIENT TYPE:  INP   LOCATION:  4713                                 FACILITY:  [facility]   PHYSICIAN:  Meade Maw, M.D.                 DATE OF BIRTH:  [birth year]   DATE OF ADMISSION:  08/11/2003  DATE OF DISCHARGE:  08/14/2003                                 DISCHARGE SUMMARY   CHIEF COMPLAINT:  Epigastric/ right upper quadrant chest pain.   HISTORY OF PRESENT ILLNESS:  Please see complete H&P for details.  However,  this is a 70 year old male with known coronary artery disease.  He is status  post stent placement to the RCA, LAD and circumflex with the last  intervention on Feb 14, 2003.  He has been on aspirin and Plavix.  He was  admitted approximately three weeks prior to this admission with gallstone  pancreatitis. His pancreatitis was resolved.  He was discharged home on  July 31, 2003.  He was planned for outpatient laparoscopic  cholecystectomy once his strength had returned some.  He was taken off his  Plavix secondary to impending surgery.   He again developed epigastric and right upper quadrant pain that he  described as a pressure.  This lasted around 20 minutes.  He took one  sublingual nitroglycerin without any relief in his discomfort. He presented  to the emergency room at that time for further evaluation and treatment.   PHYSICAL EXAMINATION:  Please see complete H&P.  VITAL SIGNS:  Stable.  He was afebrile.  Exam was essentially benign with  clear breath sounds, normal heart sounds, no vascular bruits and good  peripheral pulses.   LABORATORY DATA:  Admission labs included CBC, CMP, amylase. lipase, LFT's,  all normal with the exception of hyperglycemia at 239.  EKG showed normal  sinus rhythm with no changes.   HOSPITAL COURSE:  GENERAL:  The patient was admitted to rule out myocardial  infarction.  Surgical consultation will be re  obtained for possibly moving  his surgery up for during this admission.  Serial enzymes will be obtained  as well.   For the rest of his admission, he essentially has had early intermittent  discomfort that he states is right upper quadrant, radiating through to his  back.  He states it feels more consistent with cholecystitis than chest  discomfort that he experienced previously prior to his PCI.   Vital signs remain stable.   EKG was unchanged showing normal sinus rhythm.  Serial cardiac enzymes were  all normal.   At this point, an adenosine Cardiolite study was planned after surgery.  I  saw the patient on August 12, 2003, and was planning surgery for the  following afternoon.  On August 13, 2003, the patient underwent adenosine  Cardiolite study without incident.  His physical assessment was essentially  unchanged.  Repeat lab studies were normal.  Vital signs remain stable.  Adenosine Cardiolite study was negative for ischemia and showed normal LV  function with an EF of 58%.   On the afternoon of August 13, 2003, the patient went to the OR for  laparoscopic cholecystectomy by Dr. Johna Sheriff.  He tolerated the procedure  well without complications.   On August 14, 2003, the patient had greatly improved.  He was taking  adequate p.o. intake without difficulty.  The exam was benign.  Vital signs  are stable.  Sodium, potassium, BUN and creatinine are all within normal  limits.  From a cardiac standpoint, he was ready for discharge.  From a  surgical standpoint, he was also ready for discharge.  They also state it  would be safe for him to restart aspirin and Plavix.   The patient was discharged home on August 14, 2003, without incident.   DISCHARGE MEDICATIONS:  1. Aspirin 81 mg daily.  2. Plavix 75 mg daily.  3. Micardis 80 mg daily.  4. Pravachol 80 mg daily.  5. Lotensin 10 mg daily.  6. Allopurinol 300 mg daily.  7. Amaryl 4 mg daily.  8. Hytrin 5 mg daily.   9. Nitroglycerin 0.4 mg p.r.n.  10.      He was given a prescription by Dr. Johna Sheriff for Vicodin p.r.n.  11.      He can also use Tylenol p.r.n. for mild discomfort.   DISCHARGE ACTIVITY:  As tolerated.   DISCHARGE DIET:  No dietary restrictions with the exception of  low-fat, low-  cholesterol.   DISCHARGE INSTRUCTIONS:  1. He may remove his bandage and shower, but he has been instructed to leave     his Steri-Strips in place.  2. He has a followup with Dr. Johna Sheriff in approximately two weeks.  He has     been instructed to call for an appointment.  3. He has an appointment for Dr. Fraser Din for followup on Wednesday, September 11, 2003, at 12:30 p.m.      Adrian Saran, N.P.                        Meade Maw, M.D.    HB/MEDQ  D:  08/14/2003  T:  08/15/2003  Job:  [id number]   cc:   Sharlet Salina T. Hoxworth, M.D.  [address], [address]  [place]  [place] [address]  Fax: [phone]

## 2011-02-19 NOTE — Consult Note (Signed)
NAME:  Keith Espinoza, Keith Espinoza NO.:  [id number]   MEDICAL RECORD NO.:  [id number]                   PATIENT TYPE:  INP   LOCATION:  2904                                 FACILITY:  [facility]   PHYSICIAN:  Lesleigh Noe, M.D.            DATE OF BIRTH:  Nov 11, 1940   DATE OF CONSULTATION:  07/28/2003  DATE OF DISCHARGE:                                   CONSULTATION   INDICATIONS FOR CONSULTATION:  Cardiology clearance for planned  cholecystectomy.   CONCLUSIONS:  1. Gallstone pancreatitis in need of common duct stone removal  and     cholecystectomy.  2. History of coronary atherosclerotic heart disease.     A. Status post multivessel percutaneous coronary stent implants in April        and May 2004 with a LAD, circumflex and right coronary stent.     B. Known normal LV function.     C. No current specific cardiac complaints.  3. Hypertension.  4. Hyperlipidemia.  5. Diabetes.   RECOMMENDATIONS:  1. The patient is at the end of the required time necessary for Plavix     antiplatelet therapy following stent implantation and Plavix can be     safely discontinued.  2. The patient is cleared for surgery in the absence of specific symptoms. I     do not feel further diagnostic evaluation  is indicated in the absence of     cardiac symptoms or EKG abnormalities.  3. An EKG postoperatively.   COMMENTS:  The patient is a pleasant 70 year old gentleman who  initially  presented with chest pain in April 2006 and underwent right coronary stent  implantations with 2 Cypher stents placed in the mid right coronary. On Feb 14, 2003, he subsequently underwent mid LAD and proximal circumflex  stenting, also without complications. He has been asymptomatic since that  time. He denies  orthopnea or PND. He presented on this occasion on July 23, 2003, with epigastric discomfort, back discomfort, nausea and vomiting,  and was subsequently diagnosed as having gallstone  pancreatitis.   ADMISSION MEDICATIONS:  1. Plavix 75 mg every day.  2. Aspirin 1 per day.  3. Prevacid 80 mg every day.  4. Benazepril 10 mg every day.  5. Amaryl 4 mg every day.  6. Sublingual nitroglycerin p.r.n.   ALLERGIES:  None known.   For family history, social history and other  past medical history please  refer to the recent  history and physical performed on admission.   PHYSICAL EXAMINATION:  VITAL SIGNS:  The patient is hypertensive, systolic  blood pressure is 180, diastolic 80, heart rate is 68.  SKIN:  Somewhat pale but dry.  NECK:  No JVD or carotid bruits.  LUNGS:  Grossly clear with the exception of basilar crackles.  CARDIAC:  No gallop, no rub or click. A 1/6 systolic murmur, no diastolic  murmur.  ABDOMEN:  Slightly distended, mildly tender.  EXTREMITIES:  No edema.   LABORATORY DATA:  An EKG on admission was entirely normal with the exception  of nonspecific ST abnormality and repeat EKG done on July 27, 2003, is  unchanged from that.   DISCUSSION:  The patient seems to be doing well from a cardiac standpoint.  He has had 5-1/2 months of Plavix post implantation of the last Cypher  stent. I feel it is safe to stop Plavix at this point. I would recommend an  EKG postoperatively and resumption of aspirin  and Plavix therapy when safe  following surgery. Will notify Dr. Fraser Din of the patient's presence and she  will follow with you.                                                 Lesleigh Noe, M.D.    HWS/MEDQ  D:  07/28/2003  T:  07/28/2003  Job:  [id number]   cc:   Lilla Shook, M.D.  [address]  [place]  [place]  [address]  Fax: [phone]   Candyce Churn, M.D.  [address] [place]  [place] [address]  Fax: [phone]   Velora Heckler, M.D.  [address] [place]  [place] [address]  Fax: [phone]   Everardo All. Madilyn Fireman, M.D.  [address]  [place]  [place] [address]  Fax:  [phone]   Adolph Pollack, M.D.  [address]  [place]  [place] [address]  Fax: [phone]

## 2011-02-19 NOTE — Consult Note (Signed)
NAME:  GIAN, YBARRA NO.:  [id number]   MEDICAL RECORD NO.:  [id number]                   PATIENT TYPE:  INP   LOCATION:  4713                                 FACILITY:  [facility]   PHYSICIAN:  Sharlet Salina T. Hoxworth, M.D.          DATE OF BIRTH:  Oct 29, 1940   DATE OF CONSULTATION:  08/12/2003  DATE OF DISCHARGE:                                   CONSULTATION   REASON FOR CONSULTATION:  Abdominal pain.   HISTORY OF PRESENT ILLNESS:  The patient is a 70 year old white male who  approximately three weeks ago had developed acute abdominal pain and was  admitted to the hospital with the findings consistent with acute biliary  pancreatitis.  A CT scan at that time showed pancreatic edema and multiple  gallstones.  He had markedly elevated pancreatic enzymes and elevated liver  function tests.  His clinical signs and symptoms gradually resolved.  The  liver function tests and enzymes returned to normal.  He was discharged  about two weeks ago, with plans for an elective cholecystectomy as he gained  strength.  He, however, yesterday developed the onset of acute pressure-like  low substernal and epigastric pain.  This occurred after eating breakfast.  This was associated with diaphoresis and some nausea.  It lasted for several  hours, and began to improve after he was admitted.  Today he is experiencing  just some soreness in his mid-abdomen.  No fever, chills, or jaundice.  He  has been pain-free but rather weak since discharge from the hospital three  weeks ago.   PAST SURGICAL HISTORY:  1. Right inguinal hernia repair.  2. Hip replacement.  3. Knee arthroscopy.   PAST MEDICAL HISTORY:  1. Coronary artery disease, status post stent placement x3 in May 2004, Dr.     Lyn Records.  2. Hypertension.  3. Adult onset diabetes mellitus, oral agent-controlled.  4. Hyperlipidemia.  5. Gout.  6. Osteoarthritis.   CURRENT MEDICATIONS:  1. Lotensin 10 mg  p.o. daily.  2. Zocor 40 mg daily.  3. Allopurinol 300 mg daily.  4. Hytrin 5 mg q.h.s.  5. Avapro 300 mg daily.  6. Amaryl 4 mg daily.  7. Aspirin 325 mg daily.  8. Enoxaparin 95 mg subcutaneously q.12h.  9. Nitroglycerin 0.4 mg sublingual p.r.n.   ALLERGIES:  No known drug allergies.   SOCIAL HISTORY:  He does not smoke cigarettes, but does chew tobacco.  No  alcohol use.   FAMILY HISTORY:  Significant for heart disease and prostate cancer.   REVIEW OF SYSTEMS:  GENERAL:  Positive for weakness since discharge three  weeks ago.  No fever or chills.  CARDIAC:  Denies shortness of breath or any  mid-substernal chest pain or swelling.  GI:  As above.   PHYSICAL EXAMINATION:  VITAL SIGNS:  Temperature 98 degrees, pulse 74,  respirations 20, blood pressure 121/77.  GENERAL:  A  mildly obese white male, in no acute distress.  SKIN:  Warm and dry, without rash or infection.  HEENT:  Eyes:  No icterus.  Pupils reactive.  NECK:  No masses, no thyromegaly.  HEART:  A regular rate and rhythm without murmurs.  No jugular venous  distention or edema.  LUNGS:  Clear, without increased work of breathing.  ABDOMEN:  Mildly obese.  There is a right upper quadrant abdominal  tenderness with some guarding, and no palpable masses or hepatosplenomegaly.  EXTREMITIES:  No deformity or edema.  NEUROLOGIC:  Motor and sensory grossly normal.   LABORATORY DATA:  White count 8,800, hemoglobin 12.7, platelets 228,000.  CPK, MB and troponin have been negative.  Electrocardiogram is without acute changes.  Lipase is 46, amylase 127.  Electrolytes normal.  Glucose 329.  Liver  function tests all normal.  Albumin 3.0.   ASSESSMENT/PLAN:  Acute low substernal and epigastric pain, and now with  right upper quadrant tenderness, and a history of recent gallstone  pancreatitis.  He appears to be having a severe episode of biliary colic  that is improving.  No evidence for acute infection.  A myocardial   infarction has been ruled out.  Coronary artery disease seems much less  likely the source of pain.  This has been discussed with Dr. Armanda Magic.  It is felt that a preoperative stress test would be indicated, to more  completely rule out coronary disease, and this is planned for tomorrow.  If  this is negative, we will plan to proceed with a laparoscopic  cholecystectomy as soon as possible following this test.                                               Sharlet Salina T. Hoxworth, M.D.    Tory Emerald  D:  08/12/2003  T:  08/12/2003  Job:  [id number]

## 2011-02-19 NOTE — Discharge Summary (Signed)
NAME:  Keith Espinoza, Keith Espinoza NO.:  [id number]   MEDICAL RECORD NO.:  [id number]                   PATIENT TYPE:  INP   LOCATION:  5528                                 FACILITY:  [facility]   PHYSICIAN:  Candyce Churn, M.D.          DATE OF BIRTH:  August 01, 1941   DATE OF ADMISSION:  07/22/2003  DATE OF DISCHARGE:  07/31/2003                                 DISCHARGE SUMMARY   DISCHARGE DIAGNOSES:  1. Gallstone pancreatitis, resolved.  2. Type 2 diabetes mellitus.  3. Coronary artery disease, status post three stents, one each to the left     anterior descending, circumflex, and right coronary artery.  Cardiologist     - Dr. Verdis Prime.  4. Hypertension.  5. Dyslipidemia.  6. Obesity.  7. Lumbar degenerative joint disease.  8. Gout.  9. History of renal calculi.   DISCHARGE MEDICATIONS:  1. Lotensin 10 mg orally daily.  2. Allopurinol 150 mg daily.  3. Terazosin 5 mg p.o. at night.  4. Micardis 80 mg a day.  5. Pravachol 80 mg a day.  6. Amaryl 4 mg a day.  7. Aspirin 81 mg a day.  8. Nexium 40 mg daily for one week.  9. Potassium chloride 20 mEq daily.   DIET:  Low fat and no simple sugars.   PROCEDURES:  Abdominal CT scan and pelvic CT scan - July 23, 2003.  Significant for peripancreatic edema, cholelithiasis, and a right renal  cyst.  CT scan of the pelvis significant for diverticulosis.   CONSULTATIONS:  1. Dr. Avel Peace, general surgery - July 25, 2003.  2. Dr. Verdis Prime, cardiology - July 28, 2003.  3. Dr. Dorena Cookey, GI - July 23, 2003.   HOSPITAL COURSE:  1. Gallstone pancreatitis.  The patient was admitted with nausea, vomiting     and abdominal pain that started acutely after eating pork chops and a     piece of [place] [employer] cake on July 22, 2003.  He had severe upper     abdominal pain that was actually more on the left upper quadrant.  He was     found in the [facility] Emergency Room to have a  lipase of greater than     11,000 and amylase of 1818.  There was evidence cholelithiasis also on CT     scannnig.   The patient was admitted and treated with intravenous fluids, IV narcotics  and anti-nauseants.  By the second day of admission, his amylase had dropped  to 1300 and lipase had dropped to 845 and by July 25, 2003 his amylase  was down to 553 and lipase was 129.   Abdominal CT scan suggested a possible early phlegmon.  He symptomatically  continued to improve on a daily basis.  He did have fever that occurred  July 23, 2003 up to 100.8 and 101 on July 24, 2003 and Unasyn  was  started.  White count was never markedly elevated in the 10,000 to 12,000  range.   The patient was actually able to start eating approximately July 27, 2003  and was actually eating a low fat diet very well by July 30, 2003.   He was seen in consultation by Dr. Avel Peace who recommended  cholecystectomy after all inflammation had resolved and the patient had had  a chance to recuperate from the pancreatitis.  Liver functions were only  minimally elevated while admitted with an alk. Phos. of 75, SGOT 108, SGPT  71.  These never became elevated.   1. History of coronary artery disease, status post stents.  The patient had     negative CPKs while admitted.  No symptomatic evidence to suggest     myocardial ischemia.  It was felt by Dr. Katrinka Blazing that it was okay for him     to discontinue Plavix which he had been on since last coronary artery     tenting.  He was felt to be hemodynamically stable for surgery per Dr.     Katrinka Blazing, but Dr. Abbey Chatters wished to wait a few weeks to allow Mr. Luger     to recovery fully from the abdominal pain and pancreatitis.  2. Fluids and electrolytes.  The patient had fairly low potassium while     admitted and had to have multiple potassium intravenously to keep his     potassium up in the 3 range.  3. Diabetes mellitus.  Diabetes was controlled with  sliding scale insulin     and when taking p.o., his oral  medicines were started.   The patient was ambulatory and eating well at the time of discharge on  July 31, 2003.                                                Candyce Churn, M.D.    RNG/MEDQ  D:  08/17/2003  T:  08/17/2003  Job:  [id number]   cc:   Everardo All. Madilyn Fireman, M.D.  [address]  [place]  [place] [address]  Fax: [phone]   Adolph Pollack, M.D.  [address]  [place]  [place] [address]  Fax: [phone]   Lyn Records III, M.D.  [address]  [place]  [place] [address]  Fax: [phone]

## 2011-02-19 NOTE — Consult Note (Signed)
NAME:  Keith Espinoza, Keith Espinoza NO.:  [id number]   MEDICAL RECORD NO.:  [id number]                   PATIENT TYPE:  INP   LOCATION:  6527                                 FACILITY:  [facility]   PHYSICIAN:  Meade Maw, M.D.                 DATE OF BIRTH:  1940-11-29   DATE OF CONSULTATION:  01/08/2003  DATE OF DISCHARGE:                                   CONSULTATION   INDICATIONS FOR PROCEDURE:  Chest pain, atypical.   HISTORY:  Keith Espinoza is a pleasant 70 year old married white male who is a  patient of Dr. Johnella Moloney.  He has had no prior cardiac risk history.  He  does have significant risk factors including hypertension, age, and gender.  His cholesterol status is unknown.  He has recently diagnosed adult-onset  diabetes mellitus.  He presents to the ER with complaints of a chest  discomfort described as a tight sensation, less than two minutes in  duration.  He attributed this to a recent upper respiratory infection.  This  subsequently resolved.  He later became hot and diaphoretic.  He had to  change his shirts. This was associated with nausea and worsening of his  chest pain with a 4/10.  The symptoms persisted for approximately three  hours.  He noted his blood pressure at home to be 84/46.  He states that he  just did not feel good.  The chest pain waxed and waned.  He presented to  the ER with his wife at 11 p.m.  Today, he had a another separate episode of  a hurting sensation that occurred at rest and persisted for less than two  minutes.   PAST MEDICAL HISTORY:  1. Hypertension.  2. Newly diagnosed diabetes mellitus.  3. Allergic rhinitis.   CURRENT MEDICATIONS:  1. Enteric-coated aspirin.  2. Micardis 80/12.5.  3. Hytrin 5 mg p.o. q.h.s.  4. Lotensin 10 mg daily.  5. Allegra 180 mg daily.   ALLERGIES:  He has no known drug allergies.   PAST SURGICAL HISTORY:  1. Right inguinal hernia repair in 2003.  2. Total hip replacement in  2003.   SOCIAL HISTORY:  He is married for 44 years.  No history of tobacco.  He  quit cigarettes approximately 30 years prior.  He chews one pack per day.  No history of alcohol use.  No regular exercise.   FAMILY HISTORY:  Mother is 61 and alive. Father passed at age 16 from  prostate cancer, one brother with head and neck cancer, one sister with  diabetes and hypertension.  He has two children.  Recently, daughter has  been diagnosed with a CVA.   REVIEW OF SYSTEMS:  He notes that he has had a dry cough, significant hiatal  hernia.   PHYSICAL EXAM:  GENERAL:  Middle-aged male appearing older than his stated  age in no acute distress.  VITAL SIGNS:  Blood pressure is 110/64, heart rate is 72, respiratory rate  is 20, O2 sat is 96%.  HEENT:  Unremarkable.  NECK:  There are good carotid upstrokes.  There are no carotid bruits noted.  PULMONARY:  Breaths sounds equal and clear to auscultation.  No use of  accessory muscles.  CARDIOVASCULAR:  Regular rate and rhythm.  There is a 2/6 systolic ejection  murmur noted.  ABDOMEN:  Obese, nontender, normal bowel sounds.  EXTREMITIES:  No peripheral edema.  NEUROLOGIC:  Nonfocal.  SKIN:  Warm and dry.   Telemetry reveals a sinus rhythm.  There has been frequent trigeminal rhythm  noted.  His ECG reveals a sinus rhythm.  There is nonspecific ST changes  noted in the inferolateral leads.   LABORATORY DATA:  White count 7.4, hematocrit is 36, platelet count is 116.  His potassium was 3.1, creatinine slightly elevated at 1.9. CK is 134, with  CK-MB at 3.5, initial troponin-I was 0.01, second troponin-I was 0.05.   IMPRESSION:  1. Chest pain associated with nonspecific ST changes and slight elevation in     his troponin-I:  In view of his mild renal insufficiency, we will     initially proceed with an Adenosine-Cardiolite for further evaluation.     The patient has significant risk factors.  Agree with the ongoing use of     Lovenox and  aspirin.  2. Hypertension:  The patient currently is off his anti-hypertensive     medication with good control.  3. Dyslipidemia:  His LDL goal is less than 100.  If he has not had a recent     lipid profile, this should be obtained.                                               Meade Maw, M.D.    HP/MEDQ  D:  01/08/2003  T:  01/09/2003  Job:  [id number]

## 2011-02-19 NOTE — Op Note (Signed)
NAME:  Keith Espinoza, Keith Espinoza NO.:  [id number]   MEDICAL RECORD NO.:  [id number]                   PATIENT TYPE:  INP   LOCATION:  5011                                 FACILITY:  [facility]   PHYSICIAN:  Reynolds Bowl, M.D.                 DATE OF BIRTH:  1941/09/24   DATE OF PROCEDURE:  DATE OF DISCHARGE:  04/07/2002                                 OPERATIVE REPORT   PREOPERATIVE DIAGNOSIS:  Osteoarthritis, right hip.   POSTOPERATIVE DIAGNOSIS:  Osteoarthritis, right hip.   PROCEDURE:  Press-fit right total hip arthroplasty.   SURGEON:  Reynolds Bowl, M.D.   ASSISTANT:  Ricci Barker, M.D.   ANESTHESIA:  General.   DESCRIPTION OF PROCEDURE:  The patient had an IV started, 2 g of Ancef were  given.  He was placed on the operating table in the supine position, given a  general anesthetic by endotracheal tube.  A Foley catheter was put in place.  He was then placed in the lateral position with the right side up, and the  hip and groin were isolated using an U-drape.  He was then prepped from the  edges of this drape to the tips of the toes with Duraprep and draped in the  usual manner to include the use of two Iobans.   The hip was flexed to 90 degrees and held in neutral rotation, and a  straight posterolateral incision was made, carried down to the iliotibial  band, which was split anterior to the Zickel band.  Then the gluteus fibers  were separated in the direction of their fibers, thus exposing the external  rotator area.  External rotators were isolated, identified, divided, tagged,  and retracted.   The sciatic nerve was digitally identified and followed throughout the  course but not dissected.  The hip was very tight.  The capsule was exposed  thoroughly, then the capsule opened in a T-fashion, edges were tagged, and  with some difficulty the hip was dislocated.  It was resected approximately  13 mm above the lesser trochanter.  Attention was  then directed to the  acetabulum, where a very large labrum was excised.  The capsule was then  pushed up and over, exposing osteophytes around the rim.  These were  removed.   A large mass of soft tissue in the fovea was removed, following which I used  the acetabular reamers, sequentially reaming up to size 52, at which point  we had a good concentric acetabular  bed with petechial bleeding and a  couple of small subchondral cysts, which were curetted.  I used a trial and  decided on inserting the 52E acetabular shell with adequate abduction and  anteversion.  This shell then received a liner, and we then moved on to the  femoral shaft, which was sequentially reamed, then rasped to accept primary  Secure Fit Plus number  9 stem.  We then worked with trials and felt that  there was too much laxity for good stability and, therefore, used a 10  degree eccentric cup insert, 32 mm, and a 32 -10C taper head.  This allowed  excellent motion and stability, and there was no significant laxity in the  system.  So these components were then tightly hammered in position, and we  were pleased with the fit.  We again checked the motion and the stability,  it was excellent.  The capsule was then closed using #1 Vicryl, the  iliotibial band was repaired the same, the myofascia of the gluteus maximus  was approximated with the same, supplemented with 2-0 Vicryls, more  superficial tissues were approximated with 2-0 Vicryl, the skin edges held  in that position with nylon staples.   It should be noted that the external rotators were repaired back to local  soft tissues to try to accommodate dead space.  One suction Hemovac was  placed superficial to the iliotibial band.   Throughout the procedure there was continuous bleeding from cancellous bone.  We had estimated we lost approximately 800 cc and, for that reason, two  units of packed red cells were going to be given in the recovery room.  In  the  recovery room, the patient had good neurologic function.                                               Reynolds Bowl, M.D.    JWK/MEDQ  D:  04/30/2002  T:  05/02/2002  Job:  [id number]

## 2011-02-19 NOTE — Cardiovascular Report (Signed)
NAME:  Keith Espinoza, SCHIAVO NO.:  [id number]   MEDICAL RECORD NO.:  [id number]                   PATIENT TYPE:  OIB   LOCATION:  6533                                 FACILITY:  [facility]   PHYSICIAN:  Lesleigh Noe, M.D.            DATE OF BIRTH:  Nov 05, 1940   DATE OF PROCEDURE:  02/14/2003  DATE OF DISCHARGE:                              CARDIAC CATHETERIZATION   INDICATIONS FOR PROCEDURE:  Documented high grade LAD and circumflex disease  in this patient with a recent episode of unstable angina January 10, 2003.  He  is status post stent of the right coronary January 10, 2003.   PROCEDURE PERFORMED:  1. Stent LAD with drug-eluting Cypher stent.  2. Stent circumflex with Cypher drug-eluting stent.  3. Right coronary angiography.   DESCRIPTION:  After informed consent, a 6-French sheath was started in the  right femoral artery using the modified Seldinger technique.  A bolus  followed by an infusion of Angiomax was then started.  ACT was documented to  be greater than 250 seconds.   We then performed predilation angioplasty on the circumflex using a 15 mm  long x 2.5 mm diameter Quantum balloon.  Following this, we deployed a 2.5 x  18 mm Cypher stent to 15 atmospheres.  The guide catheter used was a #4 6-  [place] left Judkins guide catheter.  A BMW wire was used.  Post deployment  angiography result was very nice.   Diagnostic angiography on the right coronary was performed with a JR-4 6  [place].  The mid right coronary was widely patent without evidence of  restenosis in the mid right coronary.   We then turned our attention to the LAD.  We needed a 3.5 cm left Judkins 6  [place] guide catheter for this vessel.  A new BMW wire was used.  We  predilated with the same 15 mm long Quantum 2.5 mg balloon to 12 atmospheres  and then deployed an 18 mm long x 2.5 mm Cypher stent to 17 atmospheres  which was an effective diameter of 2.7 mm.  The patient  tolerated the  procedure without complications.  There was ST elevation and chest  discomfort with each inflation in both the circumflex and LAD territory.  ith this intervention we felt that it was preferable to surgery given the  patient's otherwise normal coronary arteries.   CONCLUSIONS:  1. Successful stent in the circumflex with reduction from 85% to 0%     following deployment of Cypher drug-eluting stent as outlined above.  2. Successful stent of the mid left anterior descending with reduction from     80% to 0% following Cypher drug-eluting stent as outlined above.  3. Continued wide patency of the mid right coronary which had received     Cypher stent implantation on January 10, 2003.    PLAN:  1. Plavix and aspirin x6  months.  2. Discharge in a.m.  3. Discontinue Angiomax.                                               Lesleigh Noe, M.D.    HWS/MEDQ  D:  02/14/2003  T:  02/15/2003  Job:  [id number]   cc:   Candyce Churn, M.D.  [address] [place]  [place] [address]  Fax: [phone]   Dr. Candace Cruise

## 2011-02-19 NOTE — Cardiovascular Report (Signed)
NAME:  Keith Espinoza, Keith Espinoza NO.:  [id number]   MEDICAL RECORD NO.:  [id number]                   PATIENT TYPE:  INP   LOCATION:  6527                                 FACILITY:  [facility]   PHYSICIAN:  Meade Maw, M.D.                 DATE OF BIRTH:  04/04/1941   DATE OF PROCEDURE:  DATE OF DISCHARGE:                              CARDIAC CATHETERIZATION   INDICATIONS FOR PROCEDURE:  Unstable angina with reversal in posterolateral  wall noted.   PROCEDURE:  After obtaining written informed consent, the patient was  brought to the cardiac catheterization laboratory in a postabsorptive state.  Preoperative sedation was achieved using IV Versed.  The right groin was  prepped and draped in the usual sterile fashion.  Local anesthesia was  achieved using 1% Xylocaine.  A 6 French hemostasis sheath was placed into  the right femoral artery using the modified Seldinger technique.  Selective  coronary angiography was performed using a JL4, JR4 Judkins catheter.  Multiple views were obtained.  All catheter exchanges were made over a  guidewire.  The hemostasis sheath was flushed following each engagement.   FINDINGS:  The aortic pressure was 144/88.  Left ventricular pressure was  143/18.  Single plane ventriculogram revealed normal wall motion and an  ejection fraction of 65%.  There was no mitral regurgitation noted.   CORONARY ANGIOGRAPHY:  Left main:  The left main coronary artery was short.  It had a conjoined takeoff of the LAD and circumflex.  There was no  significant disease in the left main coronary artery.  Left anterior descending:  The left anterior descending gives rise to a  moderate to large D1 and small D2.  It then goes on to end as a distal  tapering branch.  There is tortuosity in the LAD.  There is up to a 60%  lesion in the mid LAD.  Circumflex vessels:  The circumflex vessel is a large caliber vessel.  It  gives rise to a small OM1, moderate  OM2.  This was followed by a 60-70%  lesion.  The circumflex then goes on to give rise to a large bifurcating  OM3.  The distal circumflex has diffuse disease of up to 50%.  Right coronary artery:  The right coronary artery is a large dominant  artery.  It gives rise to several RV marginals.  There is a discrete 80-90%  lesion noted in the proximal to midportion of the right coronary artery.  The PDA and PL branch is free of disease.   IMPRESSION:  1. Borderline disease in the mid left anterior descending, mid circumflex.  2. Critical disease in the proximal to mid right coronary artery.  3. Distal left anterior descending is a small tapering branch.  4. Preserved left ventricular function.   RECOMMENDATIONS:  The films will be reviewed with Dr. Verdis Prime.  Further  intervention  pending his review.                                               Meade Maw, M.D.    HP/MEDQ  D:  01/10/2003  T:  01/11/2003  Job:  [id number]

## 2011-02-19 NOTE — Op Note (Signed)
NAME:  Keith Espinoza, Keith Espinoza                ACCOUNT NO.:  [id number]   MEDICAL RECORD NO.:  [id number]          PATIENT TYPE:  AMB   LOCATION:  DFTL                         FACILITY:  [facility]   PHYSICIAN:  Mark C. Ophelia Charter, M.D.    DATE OF BIRTH:  [birth year]   DATE OF PROCEDURE:  07/23/2005  DATE OF DISCHARGE:                                 OPERATIVE REPORT   PREOPERATIVE DIAGNOSIS:  Right L4-5 recurrent herniated nucleus pulposus  with free fragment.   POSTOPERATIVE DIAGNOSIS:  Right L4-5 recurrent herniated nucleus pulposus  with free fragment.   PROCEDURE:  Right L4-5 microdiskectomy for recurrent HNP, with removal of  free fragment.   SURGEON:  Mark C. Ophelia Charter, M.D.   ASSISTANT:  Amy, R.N.F.A.   ANESTHESIA:  GOT plus Marcaine skin local.   ESTIMATED BLOOD LOSS:  Minimal.   DESCRIPTION OF PROCEDURE:  After induction of general anesthesia, the  patient was placed on chest rolls due to his previous total hip arthroplasty  on a regular table. He was intubated and turned prone on the chest rolls.  Standard prepping and draping, squaring the area with towels.  Sterile skin  marker.  Betadine and Vi-Drape was applied.   The old incision was opened, and cross-table needle lateral x-ray was taken  with Nicholos Johns #4 slid down at the level of the old laminotomy in line with  the level of the disk which was confirmed with the cross-table lateral x-  ray.  Disk fragment had migrated inferiorly, and laminotomy was performed on  the right at L5 as well as going up and taking some bone at L4, getting to  normal dura above and below.  The nerve root was identified distally as it  exited underneath the pedicle, removing some overhanging facets and then  excising scar tissue over the top once the nerve was properly identified.  The plane was followed up the lateral aspect of the nerve, releasing scar  tissue until disk space was encountered.  Passes were made with the  micropituitary.  At this point,  the anterior aspect of the dura was stuck  down particularly distal to the disk space where the free fragment was  located.  Dissection was performed until the pocket was opened, and then a  large free fragment chunk was removed, teasing it out piece by piece,  pulling on it until it was removed as a large chunk.  After this, passes  were made.  Residual annulus was excised, and tissue was removed until all  tissue was off of the bone and it was cleared out all the way to the  midline.  Some disk material was cephalad to the disk space, but this was  minimal.  Passes were made through the disk with up/down micropituitaries.  After thorough irrigation, passes with the hockey stick 180 degrees anterior  to the dura with no areas of compression.  Care was taken to make sure that  all disk was peeled off of the anterior aspect of the dura.  The microscope, which had been draped and brought in for the  microdissection,  was removed.  The fascia was closed with 0 Vicryl, 2-0  Vicryl in the subcutaneous tissues, and skin staple closure.  Marcaine  infiltration in the skin, with staple closure and postoperative dressing.  Instrument count and needle count was correct.      Mark C. Ophelia Charter, M.D.  Electronically Signed     MCY/MEDQ  D:  07/23/2005  T:  07/23/2005  Job:  [id number]

## 2011-02-19 NOTE — Op Note (Signed)
**Note Keith via Obfuscation** [name], VERDUN NO.:  [id number]   MEDICAL RECORD NO.:  [id number]          PATIENT TYPE:  INP   LOCATION:  1403                         FACILITY:  [facility]   PHYSICIAN:  Sharlet Salina T. Hoxworth, M.D.DATE OF BIRTH:  [birth year]   DATE OF PROCEDURE:  05/07/2005  DATE OF DISCHARGE:                                 OPERATIVE REPORT   PREOPERATIVE DIAGNOSIS:  Pancreatic pseudocyst.   POSTOPERATIVE DIAGNOSIS:  Pancreatic pseudocyst.   SURGICAL PROCEDURES:  Pancreatic cyst gastrostomy.   SURGEON:  Dr. Johna Sheriff   ASSISTANT:  Dr. Jerelene Redden   ANESTHESIA:  General.   BRIEF HISTORY:  [name] is a 70 year old male with a history of gallstone  pancreatitis about 2 years ago.  He is status post laparoscopic  cholecystectomy.  He now presents with persistent mild to moderate upper  abdominal pain, and CT scan has revealed a large mature pseudocyst  approximately 11 cm in the body of the pancreas directly posterior to the  stomach.  There is a separate 4-5 cm much smaller cyst near the head of the  pancreas with no definite communication to the larger cyst, although it is  adjacent.  With these findings, we have recommended internal drainage of at  least the large pancreatic cyst.  The nature of the procedure, indications,  risks of bleeding, infection, cardiorespiratory complications were discussed  and understood.  He is now brought to the operating room for this procedure.   DESCRIPTION OF OPERATION:  The patient brought to the operating room, placed  supine position on the operating table, and a general orotracheal anesthesia  was induced.  He was given broad-spectrum antibiotics.  PAS were placed.  Correct patient and procedure were confirmed.  The abdomen was widely  sterilely prepped and draped.  A left subcostal incision was used and  dissection carried down to the subcutaneous tissue, fascial and muscle  layers using cautery and the peritoneum entered under  direct vision.  There  was an obvious large cystic mass directly posterior to the stomach.  I could  feel a much smaller mass around the head of the pancreas as well.  An  anterior gastrotomy was made with cautery and the GIA stapler.  The cyst  could be easily aspirated through the posterior wall of the stomach at about  the mid body of the stomach.  At this point, using cautery, an opening was  made through the posterior wall of the stomach into the cyst, and about 600-  700 mL of clear, grayish-brown fluid was evacuated with complete  decompression.  Feeling through the cyst gastrotomy into the cyst, I could  not feel any definite communication to the cyst in the head of the pancreas.  At this point, I did a Kocher maneuver and exposed the anterior and lateral  duodenum.  This mass was present in the head of the pancreas somewhat medial  to the duodenum.  I was able to aspirate a small amount of  typical cyst  fluid from the smaller mass into the head of the pancreas.  However, for  drainage  I was concerned that if we went through the duodenum that we would  be in the area of the distal common bile duct and papilla.  Therefore, it  might require a more extensive procedure such as a Roux-Y for drainage.  The  fact that this was a very small cyst, only about 4.5 cm, I felt it was  unlikely to enlarge or produce significant problems in the future versus  some significant risk of more extensive surgery to drain this.  I therefore  just completely aspirated this cyst with resolution.  Attention was then  turned back to the cystogastrostomy in the posterior of the stomach.  This  was then enlarged with cautery and a portion of the cyst wall and the  posterior stomach excised for biopsy.  This created a nice opening 2-3 cm in  diameter.  The entire edge of the opening was then oversewn with overlapping  figure-of-eight sutures of 2-0 silk, incorporating the gastric and cyst  walls together.   Following this, the anterior gastrotomy was closed with a  firing of the TA-60 blue load stapler.  Position was confirmed.  The abdomen  was irrigated with complete hemostasis assured.  The incision was then  closed in layers with running 0 PDS begun at either end of the incision and  then tied centrally.  The soft tissue had been infiltrated with Marcaine.  Subcutaneous tissue was irrigated and skin closed with staples.  Sponge,  needle, and instrument counts were correct.  Dry, sterile dressings were  applied and the patient taken to recovery in good condition.       BTH/MEDQ  D:  05/07/2005  T:  05/07/2005  Job:  [id number]

## 2011-02-19 NOTE — Op Note (Signed)
NAME:  Keith Espinoza, Keith Espinoza NO.:  [id number]   MEDICAL RECORD NO.:  [id number]                   PATIENT TYPE:  INP   LOCATION:  4713                                 FACILITY:  [facility]   PHYSICIAN:  Sharlet Salina T. Hoxworth, M.D.          DATE OF BIRTH:  [birth year]   DATE OF PROCEDURE:  08/13/2003  DATE OF DISCHARGE:                                 OPERATIVE REPORT   PREOPERATIVE DIAGNOSES:  1. Cholelithiasis.  2. History of gallstone pancreatitis.   POSTOPERATIVE DIAGNOSES:  1. Cholelithiasis.  2. History of gallstone pancreatitis.   PROCEDURES:  1. Laparoscopic cholecystectomy.  2. Intraoperative cholangiogram.   SURGEON:  Sharlet Salina T. Hoxworth, M.D.   ASSISTANT:  Velora Heckler, M.D.   ANESTHESIA:  General.   BRIEF HISTORY:  Keith Espinoza is a 70 year old white male with a recent  hospitalization in the past month for gallstone pancreatitis with gallstones  confirmed with an ultrasound.  This resolved, and he was being prepared for  an elective cholecystectomy when he presented with acute right upper  quadrant abdominal pain and was admitted two days ago.  We have elected to  proceed at this point with laparoscopic cholecystectomy.  The nature of the  procedure, indications, risks of bleeding, infection, embolic injury were  discussed with Keith Espinoza preoperatively.  Pancreatic enzymes and LFTs are  normal at this time.   DESCRIPTION OF PROCEDURE:  The patient was brought to the operating room,  placed in supine position on the operating table, and general endotracheal  anesthesia was induced.  The abdomen was widely sterilely prepped and  draped.  He received preoperative antibiotics.  PAS were in place.  Local  anesthesia was used to infiltrate the trochar sites prior to the incisions.  Access was gained with an open Hassan technique several centimeters above  the umbilicus in the midline through a mattress suture of #0 Vicryl.  Standard four port  placement was used.  The gallbladder was visualized and  was not acutely inflamed.   The fundus was grasped and elevated out of the liver and the infundibulum  retracted inferolaterally.  The fatty tissue was stripped off the neck of  the gallbladder and peritoneum anteriorly and posteriorly triangle of Calot  was incised.  The distal gallbladder and triangle of Calot were thoroughly  dissected.  The cystic duct was identified, and the cystic duct/gallbladder  junction dissected to 360 degrees.   When the anatomy was clear an operative cholangiogram was obtained through  the cystic duct which showed good filling of a normal common bile duct and  intrahepatic ducts with free flow over the duodenum with no filling defects.  Following this the cholangiocath was removed.  The cystic duct was triply  clipped proximally and divided.  Anterior and posterior branches of the  cystic artery were divided with two proximal and one distal clip.  The  gallbladder was removed intact with  cautery and brought up through the  umbilicus.  The right upper quadrant was irrigated and inspected for  hemostasis, which appeared complete.  All CO2 was evacuated and trocars  removed.  The mattress sutures were secured at the umbilicus.  Skin  incisions were closed with interrupted subcuticular 4-0 Vicryl plus Steri-  Strips.  Sponge and needle counts were correct.  Dry sterile dressings were  applied.   The patient taken to recovery in good condition.                                               Lorne Skeens. Hoxworth, M.D.    Tory Emerald  D:  08/13/2003  T:  08/13/2003  Job:  [id number]

## 2011-02-19 NOTE — H&P (Signed)
. [facility]  Patient:    Keith Espinoza, Keith Espinoza Visit Number: [id number] MRN: [id number]          Service Type: Attending:  Reynolds Bowl, M.D. Dictated by:   Fritzi Mandes, M.D.                           History and Physical  DATE OF BIRTH:  25-Sep-1941  INTRODUCTION:  The patient is a 54-1/71-year-old man with chief complaint of painful right hip that increasingly hurts with all activity.  He has not been able to tie his shoes for six months.  He has been using a cane or crutches for two months.  He has been evaluated as an outpatient and found to have osteoarthritis of the right hip.  Total hip arthroplasty and its multiple possible complications were discussed and the patient would like to proceed on with the same.  It should be noted that he also has degenerative lumbar disk disease at L4-5 and S1, for which he has been treated with epidural steroids.  ALLERGIES:  None known.  REVIEW OF SYSTEMS:  He has past history of kidney stones.  No significant cardiorespiratory symptoms but he does have a history of a cardiac murmur found some years ago.  FAMILY HISTORY:  Noncontributory.  PAST SURGICAL HISTORY:  Right inguinal herniorrhaphy this year.  Left knee arthroscopy with partial meniscus resection and debridement in 1987.  The left knee is now asymptomatic.  MEDICATIONS:  As prescribed by his physician, Dr. Robley Fries: 1. Hytrin 5 mg 1 q.d. 2. Lotensin 10 mg 1 q.d. 3. Allopurinol 300 mg 1 q.d. 4. Micardis/HCT 80 mg 1 q.d.  PHYSICAL EXAMINATION:  VITAL SIGNS:  He is 5 feet 11 inches, 238 pounds, blood pressure 110/68, pulse 80 and regular, respirations 20, temperature 98.6.  HEENT:  He is wearing glasses.  Extraocular movements full.  Tympanic membranes have a good light reflex.  He has no teeth.  He is wearing upper and lower dentures.  He has no carotid bruit.  No cervical mass.  CHEST:  Fair volume.  Breath sounds are  clear.  HEART:  In regular rhythm.  There is a short systolic murmur at the apex.  ABDOMEN:  Round, obese, nontender.  There are no palpable masses.  Bowel sounds are okay.  ORTHOPEDIC EVALUATION:  The patient is using a cane in the left hand, has a limp with a short stance phase on the right.  In supine position, his right femoral pulse is good.  Dorsalis pedis pulse is good.  There is no leg edema. Leg lengths are approximately equal.  The right hip can flex up to about 45 degrees but at that point abducts at least 15 degrees, as well as externally rotates.  He lies in extension.  The extremities in 15 degrees of external rotation can further internally rotate to neutral only.  Abduction is to 30 degrees.  LABORATORY DATA:  X-rays:  Marked loss of cartilage space right hip.  IMPRESSION:  Osteoarthritis of the right hip, high blood pressure, gout.  PLAN:  Right total hip arthroplasty.  We will use ceramic on ceramic if possible.  I have fully discussed with him possible complications of infection, leg length inequality, phlebitis, pain, dislocations, etc. and he would like to proceed on with the procedure.  He knows there are no guarantees. Dictated by:   Fritzi Mandes, M.D. Attending:  Ellin Mayhew.  Criss Alvine, M.D. DD:  03/27/02 TD:  03/28/02 Job: [id number]

## 2011-02-19 NOTE — H&P (Signed)
NAME:  Keith Espinoza, Keith Espinoza NO.:  [id number]   MEDICAL RECORD NO.:  [id number]                   PATIENT TYPE:  INP   LOCATION:  3731                                 FACILITY:  [facility]   PHYSICIAN:  Candyce Churn, M.D.          DATE OF BIRTH:  Nov 15, 1940   DATE OF ADMISSION:  07/22/2003  DATE OF DISCHARGE:                                HISTORY & PHYSICAL   CHIEF COMPLAINT:  Nausea, vomiting, abdominal pain.   HISTORY OF PRESENT ILLNESS:  Mr. Keith Luczak. Espinoza is a pleasant 70 year old  male with a history of the following:  Coronary artery disease with stents  placed to the LAD, circumflex, right coronary artery Lyn Records, M.D.  is attending cardiologist), hypertension, type 2 diabetes mellitus,  dyslipidemia, obesity, gout, renal calculi, lumbar DJD.  He presents with  sudden onset at approximately 5 p.m. after eating pork chop and German  chocolate cake of nausea, vomiting, upper abdominal pain.  Denies chest  pain, diaphoresis, or diarrhea.  Denies fevers or chills.  Epigastric pain  more in the left upper quadrant than right upper quadrant.  Has not noticed  an obvious fever.  Came to the emergency room via car and is currently here  for evaluation.  In the emergency room he has been retching with what  appears to be noncoffee ground emesis.  The patient denies any exertional  chest pain, PND, orthopnea as of late.   MEDICATIONS:  1. Pravachol 80 mg daily.  2. Lotensin 10 mg daily.  3. Plavix 75 mg daily.  4. Allopurinol 300 mg daily.  5. Amaryl 4 mg daily.  6. Hytrin 0.5 mg p.o. q.h.s.  7. Micardis/HCT 80/12.5 mg daily.  8. Sublingual nitroglycerin 0.4 mg p.r.n.   ALLERGIES:  No known drug allergies.   PAST SURGICAL HISTORY:  1. Status post right total hip replacement.  2. Status post right inguinal hernia repair.  3. Status post left arthroscopic knee surgery for cartilage repair.   SOCIAL HISTORY:  No tobacco use in 30 years.   He has chewed tobacco in the  last year or so.  No alcohol use.  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  He is married.  His wife, [name], can be reached at [phone].  He  has one son and one daughter and apparently his daughter has had a stroke.   FAMILY HISTORY:  Mother died at age 68 this year.  Father died age 42 of  prostate cancer.  Brother has had head and neck cancer.  Sister has had  diabetes and hypertension.   REVIEW OF SYSTEMS:  As per HPI.  He felt great until after eating dinner as  per HPI.  He has recently returned from the beach and denies any recent  chest pain or dyspnea on exertion.   PHYSICAL EXAMINATION:  GENERAL:  Obese male with retching periodically,  somewhat groggy after Dilaudid and Phenergan therapy.  VITAL SIGNS:  Blood pressure 177/92, pulse 84 and regular, respiratory rate  24 and easy, temperature 97.9.  HEENT:  Normocephalic, atraumatic.  Pupils equal and reactive.  NECK:  Supple without JVD.  CHEST:  Clear to auscultation.  CARDIAC:  Regular rhythm without murmur or gallop.  ABDOMEN:  Soft with tenderness in the right and left upper quadrants, left  greater than right.  Bowel sounds were positive.  No severe distention or  tympany.   LABORATORIES:  Venous pH 7.36, pCO2 55 venous.  White count 12,600,  hemoglobin 15.2, platelet count 115,000.  Sodium 145, potassium 3.3,  bicarbonate 30, BUN 21, creatinine 1.4, glucose 151, calcium 9.5, total  bilirubin 2.1, alkaline phosphatase 75, SGOT 108, SGPT 71.  CK-MB 1.5,  troponin I less than 0.05, myoglobin 53.6.  EKG is pending.  Abdominal  CT/ultrasound pending.  Amylase and lipase pending.   ASSESSMENT:  A 70 year old male with severe nausea and vomiting and  bilateral upper quadrant abdominal pain with increased liver functions.  Doubt cardiac etiology.  Question biliary colic.  Question pancreatitis.  Question gastroenteritis but he does not have diarrhea.   PLAN:  Admit.  IV hydration.   Follow up abdominal CT and ultrasound.  Check  amylase, lipase, serial cardiac enzymes, and treat with nasal cannula O2 and  IV fluids.                                                Candyce Churn, M.D.    RNG/MEDQ  D:  07/23/2003  T:  07/23/2003  Job:  [id number]   cc:   Lesleigh Noe, M.D.  [address]  [address]  [place]  [place] [address]  Fax: [phone]

## 2011-02-19 NOTE — Cardiovascular Report (Signed)
   NAME:  Keith Espinoza, Keith Espinoza NO.:  [id number]   MEDICAL RECORD NO.:  [id number]                   PATIENT TYPE:  INP   LOCATION:  6527                                 FACILITY:  [facility]   PHYSICIAN:  Lesleigh Noe, M.D.            DATE OF BIRTH:  [birth year]   DATE OF PROCEDURE:  01/10/2003  DATE OF DISCHARGE:                              CARDIAC CATHETERIZATION   PROCEDURE:  Stent of right coronary with drug-eluting Cypher stent.   INDICATIONS FOR PROCEDURE:  Multivessel coronary artery disease with  progressive angina and abnormal Cardiolite demonstrating inferior ischemia.   DESCRIPTION OF PROCEDURE:  After the diagnostic procedure was performed by  Dr. Fraser Din, we used a 6 [place] hockey stick side hole guiding catheter and  a BMW wire to obtain access across the mid right coronary stenosis.  We  deployed a 3.0 x 13 Cypher drug-eluting stent to 16 atmospheres.  Two  balloon inflations were performed.  The final angiographic result was very  acceptable, with 0% stenosis noted.  The patient received a double bolus  followed by an infusion of Integrelin and received 50 units per kg of IV  heparin.  ACT prior to starting intervention was 306 seconds.  The patient  tolerated the procedure without complications.   ASSESSMENT:  Successful percutaneous coronary intervention of the mid right  coronary 95% stenosis with reduction to 0% after deployment of a Cypher drug-  eluting stent.   PLAN:  Staged intervention with next planned procedure on the LAD and  possibly followed on the same day by the circumflex if the contrast flow is  relatively low.  This will be scheduled for one to two weeks from now.  The  patient should be on Plavix for 6 to 12 months.                                               Lesleigh Noe, M.D.    HWS/MEDQ  D:  01/10/2003  T:  01/11/2003  Job:  [id number]   cc:   Candyce Churn, M.D.  [address] [place]  [place] [address]  Fax: [phone]   Meade Maw, M.D.  [address]  [place]  [place] [address]  Fax: [phone]

## 2011-02-19 NOTE — Op Note (Signed)
Keith Espinoza, DUBOIS NO.:  [id number]   MEDICAL RECORD NO.:  [id number]          PATIENT TYPE:  OIB   LOCATION:  2887                         FACILITY:  [facility]   PHYSICIAN:  Mark C. Ophelia Charter, M.D.    DATE OF BIRTH:  [birth year]   DATE OF PROCEDURE:  03/22/2005  DATE OF DISCHARGE:                                 OPERATIVE REPORT   PREOPERATIVE DIAGNOSIS:  Right L4-5 herniated nucleus pulposus with free  fragment.   POSTOPERATIVE DIAGNOSIS:  Right L4-5 herniated nucleus pulposus with free  fragment.   PROCEDURE:  Right L4 laminotomy, microdiscectomy, removal of free fragment.   SURGEON:  Mark C. Ophelia Charter, M.D.   ASSISTANT:  Vanita Panda. Magnus Ivan, M.D.   ANESTHESIA:  GOT.   ESTIMATED BLOOD LOSS:  Less than 100 mL.   BRIEF HISTORY:  A 70 year old male with acute onset of right leg pain,  weakness, dorsal foot numbness with progressive weakness in this leg and MRI  demonstrating free fragment that had migrated just inferior to the disc  space causing foraminal compression at the entrance of the foramina.   DESCRIPTION OF PROCEDURE:  After induction of general anesthesia and  orotracheal intubation, placed on chest rolls.  Standard DuraPrep was used,  cross-table lateral x-ray confirmed the appropriate L4-5 level with the  needle 1 or 2 mm below the level of the disc space exactly at the level of  the free fragment.  Area had been scrubbed with towels, Betadine and Vi-  drape applied.  Laminectomy sheet draped prior to needle placement.  Incision was made, starting just to the right of midline, a few millimeters.  Subperiosteal dissection was performed with Tailor retractor placed  laterally.  Part of the lamina was removed at L4.  Lateral overhang off the  facet was removed and foramen was enlarged slightly.  Nerve root was  dorsally displaced and with gentle retraction of the nerve root, there was a  firm wad of disc material that had herniated out.  No capsules  present  dorsally, however, on the volar aspect there was a capsule to the fragment  as it pushed up the posterior longitudinal ligament.  Pass was made in the  disc with minimal disc material obtained.  Anterior annulus was intact.  Foramina was enlarged and some remaining pieces of the fragment were removed  until a hockey stick could be passed 180 degree sweep anterior to the dura  with no areas of compression.  Further passes were made in the disc, micro  up and down pituitaries.  Nerve root dura was intact.  Nerve was completely  decompressed.  After irrigation with saline solution, operative microscope  that  had been used for the microdissection was removed.  Fascia was closed with 0  Vicryl, 2-0 Vicryl subcutaneous tissue, skin staple closure.  Postoperative  dressing and transfer to the recovery room in stable condition.  Instrument  count and needle count correct.       MCY/MEDQ  D:  03/22/2005  T:  03/22/2005  Job:  [id number]

## 2011-07-15 LAB — BASIC METABOLIC PANEL
BUN: 21
CO2: 31
Calcium: 9.9
Chloride: 106
Creatinine, Ser: 1.05
GFR calc Af Amer: >60
GFR calc non Af Amer: >60
Glucose, Bld: 106 — ABNORMAL HIGH
Potassium: 3.5
Sodium: 144

## 2011-07-15 LAB — DIFFERENTIAL
Basophils Absolute: 0
Basophils Relative: 1
Eosinophils Absolute: 0.3
Eosinophils Relative: 6 — ABNORMAL HIGH
Lymphocytes Relative: 31
Lymphs Abs: 1.6
Monocytes Absolute: 0.3
Monocytes Relative: 6
Neutro Abs: 2.9
Neutrophils Relative %: 56

## 2011-07-15 LAB — CBC
HCT: 38.7 — ABNORMAL LOW
Hemoglobin: 13.4
MCHC: 34.6
MCV: 89.4
Platelets: 120 — ABNORMAL LOW
RBC: 4.33
RDW: 15.8 — ABNORMAL HIGH
WBC: 5.1

## 2011-07-15 LAB — PROTIME-INR
INR: 1.1
Prothrombin Time: 13.9

## 2011-07-15 LAB — APTT: aPTT: 29

## 2011-07-16 LAB — CREATININE, SERUM
Creatinine, Ser: 1.07
GFR calc Af Amer: >60
GFR calc non Af Amer: >60

## 2011-09-23 ENCOUNTER — Encounter (HOSPITAL_COMMUNITY): Payer: Self-pay | Admitting: Pharmacy Technician

## 2011-09-29 ENCOUNTER — Other Ambulatory Visit: Payer: Self-pay | Admitting: Interventional Cardiology

## 2011-09-30 ENCOUNTER — Ambulatory Visit (HOSPITAL_COMMUNITY)
Admission: RE | Admit: 2011-09-30 | Discharge: 2011-10-01 | Disposition: A | Discharge status: Home / Self Care | Payer: Medicare Other | Source: Ambulatory Visit | Attending: Interventional Cardiology | Admitting: Interventional Cardiology

## 2011-09-30 ENCOUNTER — Other Ambulatory Visit: Payer: Self-pay

## 2011-09-30 ENCOUNTER — Encounter (HOSPITAL_COMMUNITY): Payer: Self-pay | Admitting: General Practice

## 2011-09-30 ENCOUNTER — Encounter (HOSPITAL_COMMUNITY)
Admission: RE | Disposition: A | Discharge status: Home / Self Care | Payer: Self-pay | Source: Ambulatory Visit | Attending: Interventional Cardiology

## 2011-09-30 DIAGNOSIS — E119 Type 2 diabetes mellitus without complications: Secondary | ICD-10-CM | POA: Insufficient documentation

## 2011-09-30 DIAGNOSIS — I10 Essential (primary) hypertension: Secondary | ICD-10-CM | POA: Insufficient documentation

## 2011-09-30 DIAGNOSIS — I2510 Atherosclerotic heart disease of native coronary artery without angina pectoris: Secondary | ICD-10-CM | POA: Insufficient documentation

## 2011-09-30 HISTORY — DX: Cardiac murmur, unspecified: R01.1

## 2011-09-30 HISTORY — PX: LEFT HEART CATHETERIZATION WITH CORONARY ANGIOGRAM: SHX5451

## 2011-09-30 HISTORY — DX: Atherosclerotic heart disease of native coronary artery without angina pectoris: I25.10

## 2011-09-30 HISTORY — PX: PERCUTANEOUS CORONARY STENT INTERVENTION (PCI-S): SHX5485

## 2011-09-30 HISTORY — PX: CORONARY ANGIOPLASTY WITH STENT PLACEMENT: SHX49

## 2011-09-30 HISTORY — DX: Essential (primary) hypertension: I10

## 2011-09-30 HISTORY — DX: Unspecified osteoarthritis, unspecified site: M19.90

## 2011-09-30 LAB — GLUCOSE, CAPILLARY
Glucose-Capillary: 144 mg/dL — ABNORMAL HIGH (ref 70–99)
Glucose-Capillary: 100 mg/dL — ABNORMAL HIGH (ref 70–99)
Glucose-Capillary: 261 mg/dL — ABNORMAL HIGH (ref 70–99)
Glucose-Capillary: 183 mg/dL — ABNORMAL HIGH (ref 70–99)

## 2011-09-30 LAB — POCT ACTIVATED CLOTTING TIME: Activated Clotting Time: 507 seconds

## 2011-09-30 SURGERY — LEFT HEART CATHETERIZATION WITH CORONARY ANGIOGRAM
Anesthesia: LOCAL

## 2011-09-30 SURGERY — LEFT HEART CATHETERIZATION WITH CORONARY ANGIOGRAM
Anesthesia: Moderate Sedation

## 2011-09-30 MED ORDER — SODIUM CHLORIDE 0.9 % IJ SOLN: 3.0000 mL | INTRAMUSCULAR | Status: DC | PRN

## 2011-09-30 MED ORDER — NITROGLYCERIN 0.2 MG/ML ON CALL CATH LAB
INTRAVENOUS | Status: AC
  Filled 2011-09-30: qty 1

## 2011-09-30 MED ORDER — INSULIN GLARGINE 100 UNIT/ML ~~LOC~~ SOLN
20.0000 [IU] | Freq: Every day | SUBCUTANEOUS | Status: DC
  Filled 2011-09-30: qty 3

## 2011-09-30 MED ORDER — ASPIRIN EC 325 MG PO TBEC
325.0000 mg | DELAYED_RELEASE_TABLET | Freq: Every day | ORAL | Status: DC
  Administered 2011-10-01: 325 mg via ORAL
  Filled 2011-09-30: qty 1

## 2011-09-30 MED ORDER — BIVALIRUDIN 250 MG IV SOLR
INTRAVENOUS | Status: AC
  Filled 2011-09-30: qty 250

## 2011-09-30 MED ORDER — SODIUM CHLORIDE 0.9 % IV SOLN
1.0000 mL/kg/h | INTRAVENOUS | Status: AC
  Administered 2011-09-30: 1 mL/kg/h via INTRAVENOUS

## 2011-09-30 MED ORDER — CLOPIDOGREL BISULFATE 75 MG PO TABS
75.0000 mg | ORAL_TABLET | Freq: Every day | ORAL | Status: DC
  Administered 2011-10-01: 75 mg via ORAL
  Filled 2011-09-30: qty 1

## 2011-09-30 MED ORDER — ACETAMINOPHEN 325 MG PO TABS: 650.0000 mg | ORAL_TABLET | ORAL | Status: DC | PRN

## 2011-09-30 MED ORDER — SODIUM CHLORIDE 0.9 % IV SOLN: 250.0000 mL | INTRAVENOUS | Status: DC | PRN

## 2011-09-30 MED ORDER — INSULIN ASPART 100 UNIT/ML ~~LOC~~ SOLN: 0.0000 [IU] | Freq: Every day | SUBCUTANEOUS | Status: DC

## 2011-09-30 MED ORDER — MIDAZOLAM HCL 2 MG/2ML IJ SOLN
INTRAMUSCULAR | Status: AC
  Filled 2011-09-30: qty 2

## 2011-09-30 MED ORDER — FAMOTIDINE IN NACL 20-0.9 MG/50ML-% IV SOLN
INTRAVENOUS | Status: AC
  Filled 2011-09-30: qty 50

## 2011-09-30 MED ORDER — SODIUM CHLORIDE 0.9 % IV SOLN
INTRAVENOUS | Status: DC
  Administered 2011-09-30: 1000 mL via INTRAVENOUS

## 2011-09-30 MED ORDER — NISOLDIPINE ER 20 MG PO TB24
20.0000 mg | ORAL_TABLET | Freq: Every day | ORAL | Status: DC
  Filled 2011-09-30: qty 1

## 2011-09-30 MED ORDER — INSULIN ASPART 100 UNIT/ML ~~LOC~~ SOLN
0.0000 [IU] | Freq: Three times a day (TID) | SUBCUTANEOUS | Status: DC
  Filled 2011-09-30: qty 3

## 2011-09-30 MED ORDER — VERAPAMIL HCL 2.5 MG/ML IV SOLN
INTRAVENOUS | Status: AC
  Filled 2011-09-30: qty 2

## 2011-09-30 MED ORDER — TELMISARTAN-HCTZ 80-12.5 MG PO TABS: 1.0000 | ORAL_TABLET | Freq: Every day | ORAL | Status: DC

## 2011-09-30 MED ORDER — HYDROCHLOROTHIAZIDE 12.5 MG PO CAPS
12.5000 mg | ORAL_CAPSULE | Freq: Every day | ORAL | Status: DC
  Administered 2011-10-01: 12.5 mg via ORAL
  Filled 2011-09-30 (×2): qty 1

## 2011-09-30 MED ORDER — FENTANYL CITRATE 0.05 MG/ML IJ SOLN
INTRAMUSCULAR | Status: AC
  Filled 2011-09-30: qty 2

## 2011-09-30 MED ORDER — TERAZOSIN HCL 5 MG PO CAPS
5.0000 mg | ORAL_CAPSULE | Freq: Every day | ORAL | Status: DC
  Administered 2011-09-30: 5 mg via ORAL
  Filled 2011-09-30 (×2): qty 1

## 2011-09-30 MED ORDER — SODIUM CHLORIDE 0.9 % IJ SOLN: 3.0000 mL | Freq: Two times a day (BID) | INTRAMUSCULAR | Status: DC

## 2011-09-30 MED ORDER — LIDOCAINE HCL (PF) 1 % IJ SOLN
INTRAMUSCULAR | Status: AC
  Filled 2011-09-30: qty 30

## 2011-09-30 MED ORDER — DIAZEPAM 5 MG PO TABS
5.0000 mg | ORAL_TABLET | ORAL | Status: AC
  Administered 2011-09-30: 5 mg via ORAL

## 2011-09-30 MED ORDER — MORPHINE SULFATE 2 MG/ML IJ SOLN: 1.0000 mg | INTRAMUSCULAR | Status: DC | PRN

## 2011-09-30 MED ORDER — ASPIRIN 81 MG PO CHEW
324.0000 mg | CHEWABLE_TABLET | ORAL | Status: AC
  Administered 2011-09-30: 324 mg via ORAL

## 2011-09-30 MED ORDER — ONDANSETRON HCL 4 MG/2ML IJ SOLN: 4.0000 mg | Freq: Four times a day (QID) | INTRAMUSCULAR | Status: DC | PRN

## 2011-09-30 MED ORDER — HEPARIN (PORCINE) IN NACL 2-0.9 UNIT/ML-% IJ SOLN
INTRAMUSCULAR | Status: AC
  Filled 2011-09-30: qty 1000

## 2011-09-30 MED ORDER — DIAZEPAM 5 MG PO TABS
ORAL_TABLET | ORAL | Status: AC
  Administered 2011-09-30: 5 mg via ORAL
  Filled 2011-09-30: qty 1

## 2011-09-30 MED ORDER — HYDROCODONE-ACETAMINOPHEN 5-325 MG PO TABS
1.0000 | ORAL_TABLET | Freq: Four times a day (QID) | ORAL | Status: DC | PRN
  Administered 2011-09-30: 1 via ORAL
  Filled 2011-09-30: qty 1

## 2011-09-30 MED ORDER — ALLOPURINOL 300 MG PO TABS
300.0000 mg | ORAL_TABLET | Freq: Every day | ORAL | Status: DC
  Administered 2011-10-01: 300 mg via ORAL
  Filled 2011-09-30 (×2): qty 1

## 2011-09-30 MED ORDER — NISOLDIPINE ER 17 MG PO TB24
17.0000 mg | ORAL_TABLET | Freq: Every day | ORAL | Status: DC
  Administered 2011-10-01: 17 mg via ORAL
  Filled 2011-09-30 (×2): qty 1

## 2011-09-30 MED ORDER — INSULIN ASPART 100 UNIT/ML ~~LOC~~ SOLN: 0.0000 [IU] | Freq: Three times a day (TID) | SUBCUTANEOUS | Status: DC

## 2011-09-30 MED ORDER — CLOPIDOGREL BISULFATE 300 MG PO TABS
ORAL_TABLET | ORAL | Status: AC
  Administered 2011-10-01: 75 mg via ORAL
  Filled 2011-09-30: qty 2

## 2011-09-30 MED ORDER — OLMESARTAN MEDOXOMIL 40 MG PO TABS
40.0000 mg | ORAL_TABLET | Freq: Every day | ORAL | Status: DC
  Administered 2011-10-01: 40 mg via ORAL
  Filled 2011-09-30 (×2): qty 1

## 2011-09-30 MED ORDER — ASPIRIN 81 MG PO CHEW
CHEWABLE_TABLET | ORAL | Status: AC
  Filled 2011-09-30: qty 4

## 2011-09-30 MED ORDER — GLIMEPIRIDE 4 MG PO TABS
4.0000 mg | ORAL_TABLET | Freq: Every day | ORAL | Status: DC
  Administered 2011-10-01: 4 mg via ORAL
  Filled 2011-09-30 (×2): qty 1

## 2011-09-30 NOTE — H&P (Signed)
  Date of Initial H&P: 09/13/11  History reviewed, patient examined, no change in status, stable for cath. VARANASI,JAYADEEP S. 09/30/2011

## 2011-09-30 NOTE — Brief Op Note (Signed)
09/30/2011  12:14 PM  PATIENT:  Keith Espinoza  70 y.o. male  PRE-OPERATIVE DIAGNOSIS:  abnormal stress test  POST-OPERATIVE DIAGNOSIS:  2 vessel CAD  PROCEDURE:  Procedure(s): LEFT HEART CATHETERIZATION WITH CORONARY ANGIOGRAM  SURGEON:  Surgeon(s): Corky Crafts, MD  90% mid RCA lesion.   90% diffuse Circumflex lesion. Patent left Main and LAD.  All prior stents patent. Normal LV function.  Successful PCI to mid RCA with promus DES, postdilated to 3.3 mm.  VARANASI,JAYADEEP S. 09/30/2011

## 2011-09-30 NOTE — Progress Notes (Signed)
TR BAND REMOVAL  LOCATION:  right radial  DEFLATED PER PROTOCOL:  yes  TIME BAND OFF / DRESSING APPLIED:   1530   SITE UPON ARRIVAL:   Level 1  SITE AFTER BAND REMOVAL:  Level 1  REVERSE ALLEN'S TEST:    positive  CIRCULATION SENSATION AND MOVEMENT:  Within Normal Limits  yes  COMMENTS:  Level 1 upon arrival pressure held became smaller half way thru became larger again held pressure site smaller but level 1. Fingers warm with good capillary refill and positive reverse allens present.

## 2011-09-30 NOTE — Op Note (Signed)
PROCEDURE:  Left heart catheterization with selective coronary angiography, left ventriculogram.  PCI of the right coronary artery.  INDICATIONS:  Abnormal stress test, coronary artery disease, chest pain.  The risks, benefits, and details of the procedure were explained to the patient.  The patient verbalized understanding and wanted to proceed.  Informed written consent was obtained.  PROCEDURE TECHNIQUE:  After Xylocaine anesthesia a 43F sheath was placed in the right radial artery with a single anterior needle wall stick.    Right coronary angiography was done using a Judkins R4 guide catheter.   Left coronary angiography was done using a Judkins L3.5 guide catheter.Left ventriculography was done using a pigtail catheter.  The PCI was performed using a JR 4 guiding catheter.  Angiomax was used for anticoagulation.  Intracoronary nitroglycerin was used to treat spasm caused by the angioplasty wire.  ACT was used to confirm that the anticoagulation was therapeutic.   CONTRAST:  Total of 110 cc.  COMPLICATIONS:  None.    HEMODYNAMICS:  Aortic pressure was 123/60; LV pressure was 123/2, LVEDP 11 mm Hg.  No gradient between the left ventricle and aorta.    ANGIOGRAPHIC DATA:   The left main coronary artery is  a short vessel that is widely patent.    The left anterior descending artery is  a large vessel that reaches the apex.  There is mild atherosclerosis in the proximal to mid section.  The mid vessel stent is widely patent.  The LAD does wrap around the apex.  The left circumflex artery is  a large vessel.  The first and second obtuse marginals are small.  There is a small ramus which is patent.  The second obtuse marginal has diffuse disease up to 60%.  In the mid circumflex, just before the origin of the OM 3, there is a severe stenosis up to 90%.  This stenosis extends past the origin of OM 3.  The ostium of OM 3 appears widely patent.  The right coronary artery is a medium-sized vessel  that is dominant.  There is mild atherosclerosis in the proximal bend.  The mid vessel stent is widely patent.  Just past the stent, there is a focal 90% stenosis.  The posterior lateral branch has moderate diffuse disease.  This is a small caliber vessel.  PDA has mild proximal disease and appears otherwise patent.  Keith Espinoza  LEFT VENTRICULOGRAM:  Left ventricular angiogram was done in the 30 RAO projection and revealed normal left ventricular wall motion and systolic function with an estimated ejection fraction of  60%.  LVEDP was  11  MmHg.  PCI NARRATIVE:  A JR 4 guiding catheter was used to engage the ostium of the right coronary artery.  A Prowater wire was placed across the area of stenosis.  The area was predilated with a 2.5 x 12 immerge balloon inflated to 12 atmospheres.  The area was stented with a 3.0 x 16 Promus drug-eluting stent deployed at 14 atmospheres.  A 3.25 x 12 [place] track balloon was used to post dilate the stent and was inflated to 16 atmospheres.  Intracoronary nitroglycerin was administered to treat spasm caused by the angioplasty wire.  There is no residual stenosis in the mid vessel.  The moderate atherosclerosis which was present prior to the angioplasty was still present.  TIMI-3 flow was maintained throughout.  Lesion length was 12 mm.  IMPRESSIONS:  1. Normal left main coronary artery. 2. Patent left anterior descending stent 3. Significant diffuse stenosis  in the mid to distal circumflex . 4. Severe RCA disease which was successfully stented with a Promus drug-eluting stent in the mid vessel, overlapping the prior Cypher stent.   5. Normal left ventricular systolic function.  LVEDP  11  mmHg.  Ejection fraction 60 %.  RECOMMENDATION:   patient we watched overnight.  He'll be started on Plavix.  Continue dual antiplatelet therapy for at least a year.  Continue aggressive secondary prevention.  Continue with medical management.  If he has refractory symptoms, would consider  bringing him back to revascularize his circumflex disease.

## 2011-10-01 ENCOUNTER — Other Ambulatory Visit: Payer: Self-pay

## 2011-10-01 LAB — BASIC METABOLIC PANEL
BUN: 17 mg/dL (ref 6–23)
CO2: 27 mEq/L (ref 19–32)
Calcium: 9 mg/dL (ref 8.4–10.5)
Chloride: 104 mEq/L (ref 96–112)
Creatinine, Ser: 0.98 mg/dL (ref 0.50–1.35)
GFR calc Af Amer: >90 mL/min (ref >90)
GFR calc non Af Amer: 81 mL/min — ABNORMAL LOW (ref >90)
Glucose, Bld: 110 mg/dL — ABNORMAL HIGH (ref 70–99)
Potassium: 3.2 mEq/L — ABNORMAL LOW (ref 3.5–5.1)
Sodium: 140 mEq/L (ref 135–145)

## 2011-10-01 LAB — CBC
HCT: 33.3 % — ABNORMAL LOW (ref 39.0–52.0)
Hemoglobin: 12.1 g/dL — ABNORMAL LOW (ref 13.0–17.0)
MCH: 31.3 pg (ref 26.0–34.0)
MCHC: 36.3 g/dL — ABNORMAL HIGH (ref 30.0–36.0)
MCV: 86.3 fL (ref 78.0–100.0)
Platelets: 115 10*3/uL — ABNORMAL LOW (ref 150–400)
RBC: 3.86 MIL/uL — ABNORMAL LOW (ref 4.22–5.81)
RDW: 14 % (ref 11.5–15.5)
WBC: 5.2 10*3/uL (ref 4.0–10.5)

## 2011-10-01 LAB — GLUCOSE, CAPILLARY: Glucose-Capillary: 154 mg/dL — ABNORMAL HIGH (ref 70–99)

## 2011-10-01 MED ORDER — CLOPIDOGREL BISULFATE 75 MG PO TABS: 75.0000 mg | ORAL_TABLET | Freq: Every day | ORAL | Status: DC

## 2011-10-01 MED ORDER — METFORMIN HCL 1000 MG PO TABS: 1000.0000 mg | ORAL_TABLET | Freq: Two times a day (BID) | ORAL | Status: DC

## 2011-10-01 MED ORDER — POTASSIUM CHLORIDE CRYS ER 20 MEQ PO TBCR
40.0000 meq | EXTENDED_RELEASE_TABLET | Freq: Once | ORAL | Status: AC
  Administered 2011-10-01: 40 meq via ORAL
  Filled 2011-10-01: qty 2

## 2011-10-01 MED FILL — Dextrose Inj 5%: INTRAVENOUS | Qty: 50 | Status: AC

## 2011-10-01 NOTE — Progress Notes (Addendum)
CARDIAC REHAB PHASE I   PRE:  Rate/Rhythm: 63 SR  BP:  Supine:   Sitting: 147/70  Standing:    SaO2: 97% RA  MODE:  Ambulation: 350 ft   POST:  Rate/Rhythem: 62  BP:  Supine:   Sitting: 135/83  Standing:    SaO2: 97%RA 0800 - 0900 Pt ambulated steady. No co/o cp or sob. Education done. Permission for phase 2.  Rosalie Doctor

## 2011-10-01 NOTE — Discharge Summary (Signed)
Patient ID: Keith Espinoza MRN: [id number] DOB/AGE: [birth year] 71 y.o.  Admit date: 09/30/2011 Discharge date: 10/01/2011  Primary Discharge Diagnosis Coronary artery disease Secondary Discharge Diagnosis diabetes, hypertension  Significant Diagnostic Studies: angiography: Cardiac catheterization with drug-eluting stent placement to the mid right coronary artery.  Consults: none  Hospital Course: The patient was admitted after undergoing cardiac catheterization.  He was found to have two-vessel coronary artery disease.  He underwent successful stent placement to the mid right coronary artery.  Consideration will be made for bringing him back to address the disease in the circumflex.  He had a small right wrist hematoma after the cath which was performed the right radial artery.  This improved over time.  He had a strong right radial pulse the day after the procedure.  There is no pain or swelling in the wrist.  His potassium was somewhat low and this was supplemented.  He had no chest discomfort with walking.   Discharge Exam: Blood pressure 135/83, pulse 64, temperature 97.7 F (36.5 C), temperature source Oral, resp. rate 18, height 5\' 10"  (1.778 m), weight 96.616 kg (213 lb), SpO2 100.00%.    Regular rate and rhythm, S1-S2 Clear to auscultation bilaterally 2+ right radial pulse, no hematoma No lower extremity edema Labs:   Lab Results  Component Value Date   WBC 5.2 10/01/2011   HGB 12.1* 10/01/2011   HCT 33.3* 10/01/2011   MCV 86.3 10/01/2011   PLT 115* 10/01/2011    Lab 10/01/11 0437  NA 140  K 3.2*  CL 104  CO2 27  BUN 17  CREATININE 0.98  CALCIUM 9.0  PROT --  BILITOT --  ALKPHOS --  ALT --  AST --  GLUCOSE 110*   No results found for this basename: CKTOTAL, CKMB, CKMBINDEX, TROPONINI    No results found for this basename: CHOL   No results found for this basename: HDL   No results found for this basename: LDLCALC   No results found for this basename:  TRIG   No results found for this basename: CHOLHDL   No results found for this basename: LDLDIRECT       EKG: Normal sinus rhythm, nonspecific ST segment changes  FOLLOW UP PLANS AND APPOINTMENTS  Current Discharge Medication List    START taking these medications   Details  clopidogrel (PLAVIX) 75 MG tablet Take 1 tablet (75 mg total) by mouth daily with breakfast. Qty: 30 tablet, Refills: 11      CONTINUE these medications which have CHANGED   Details  metFORMIN (GLUCOPHAGE) 1000 MG tablet Take 1 tablet (1,000 mg total) by mouth 2 (two) times daily with a meal.      CONTINUE these medications which have NOT CHANGED   Details  allopurinol (ZYLOPRIM) 300 MG tablet Take 300 mg by mouth daily.      aspirin EC 325 MG tablet Take 325 mg by mouth daily.      glimepiride (AMARYL) 4 MG tablet Take 4 mg by mouth daily before breakfast.      HYDROcodone-acetaminophen (NORCO) 5-325 MG per tablet Take 1 tablet by mouth every 6 (six) hours as needed. For pain     insulin glargine (LANTUS) 100 UNIT/ML injection Inject 20-50 Units into the skin at bedtime. Sliding scale     nisoldipine (SULAR) 20 MG 24 hr tablet Take 20 mg by mouth daily.      telmisartan-hydrochlorothiazide (MICARDIS HCT) 80-12.5 MG per tablet Take 1 tablet by mouth daily.  terazosin (HYTRIN) 5 MG capsule Take 5 mg by mouth at bedtime.         Follow-up Information    Follow up with Corky Crafts., MD in 2 weeks.   Contact information:   [address] [place] [address] [phone]          BRING ALL MEDICATIONS WITH YOU TO FOLLOW UP APPOINTMENTS  Time spent with patient to include physician time: 20 minutes Signed: VARANASI,JAYADEEP S. 10/01/2011, 9:11 AM

## 2011-10-01 NOTE — Progress Notes (Signed)
Pt chews a pack of chewing tobacco per day. Discussed risk factors of chewing tobacco on his heart and vessels. Pt voices understanding however not ready to quit at this time. Mentioned examples of different options available to pt for helping him to quit  i.e the nicotine gum and lozenges to help pt quit when/if ready. Referred to 1-800 quit now for f/u and support. Discussed oral fixation substitutes, second hand smoke and in home smoking policy. Reviewed and gave pt Written education/contact information.

## 2011-10-19 DIAGNOSIS — E7800 Pure hypercholesterolemia, unspecified: Secondary | ICD-10-CM | POA: Diagnosis not present

## 2011-10-19 DIAGNOSIS — I10 Essential (primary) hypertension: Secondary | ICD-10-CM | POA: Diagnosis not present

## 2011-10-19 DIAGNOSIS — I2510 Atherosclerotic heart disease of native coronary artery without angina pectoris: Secondary | ICD-10-CM | POA: Diagnosis not present

## 2011-11-04 ENCOUNTER — Encounter (HOSPITAL_COMMUNITY): Payer: Self-pay

## 2011-11-09 ENCOUNTER — Other Ambulatory Visit: Payer: Self-pay | Admitting: Interventional Cardiology

## 2011-11-09 DIAGNOSIS — I2510 Atherosclerotic heart disease of native coronary artery without angina pectoris: Secondary | ICD-10-CM | POA: Diagnosis not present

## 2011-11-11 ENCOUNTER — Other Ambulatory Visit: Payer: Self-pay

## 2011-11-11 ENCOUNTER — Encounter (HOSPITAL_COMMUNITY): Payer: Self-pay | Admitting: General Practice

## 2011-11-11 ENCOUNTER — Ambulatory Visit (HOSPITAL_COMMUNITY)
Admission: RE | Admit: 2011-11-11 | Discharge: 2011-11-12 | Disposition: A | Discharge status: Home / Self Care | Payer: Medicare Other | Source: Ambulatory Visit | Attending: Interventional Cardiology | Admitting: Interventional Cardiology

## 2011-11-11 ENCOUNTER — Encounter (HOSPITAL_COMMUNITY)
Admission: RE | Disposition: A | Discharge status: Home / Self Care | Payer: Self-pay | Source: Ambulatory Visit | Attending: Interventional Cardiology

## 2011-11-11 DIAGNOSIS — I2510 Atherosclerotic heart disease of native coronary artery without angina pectoris: Secondary | ICD-10-CM | POA: Diagnosis not present

## 2011-11-11 DIAGNOSIS — R079 Chest pain, unspecified: Secondary | ICD-10-CM | POA: Diagnosis not present

## 2011-11-11 DIAGNOSIS — I209 Angina pectoris, unspecified: Secondary | ICD-10-CM | POA: Diagnosis not present

## 2011-11-11 DIAGNOSIS — F17200 Nicotine dependence, unspecified, uncomplicated: Secondary | ICD-10-CM | POA: Diagnosis not present

## 2011-11-11 DIAGNOSIS — R9430 Abnormal result of cardiovascular function study, unspecified: Secondary | ICD-10-CM | POA: Diagnosis not present

## 2011-11-11 HISTORY — PX: CORONARY ANGIOPLASTY WITH STENT PLACEMENT: SHX49

## 2011-11-11 HISTORY — DX: Personal history of other diseases of the digestive system: Z87.19

## 2011-11-11 HISTORY — PX: CORONARY ANGIOPLASTY: SHX604

## 2011-11-11 HISTORY — DX: Low back pain, unspecified: M54.50

## 2011-11-11 HISTORY — DX: Pain in leg, unspecified: M79.606

## 2011-11-11 HISTORY — DX: Cardiac arrhythmia, unspecified: I49.9

## 2011-11-11 HISTORY — DX: Other chronic pain: G89.29

## 2011-11-11 HISTORY — PX: PERCUTANEOUS CORONARY STENT INTERVENTION (PCI-S): SHX5485

## 2011-11-11 HISTORY — DX: Low back pain: M54.5

## 2011-11-11 LAB — GLUCOSE, CAPILLARY
Glucose-Capillary: 102 mg/dL — ABNORMAL HIGH (ref 70–99)
Glucose-Capillary: 150 mg/dL — ABNORMAL HIGH (ref 70–99)
Glucose-Capillary: 192 mg/dL — ABNORMAL HIGH (ref 70–99)

## 2011-11-11 SURGERY — PERCUTANEOUS CORONARY STENT INTERVENTION (PCI-S)
Anesthesia: LOCAL

## 2011-11-11 MED ORDER — HYDROCODONE-ACETAMINOPHEN 5-325 MG PO TABS
1.0000 | ORAL_TABLET | Freq: Four times a day (QID) | ORAL | Status: DC | PRN
  Administered 2011-11-11 – 2011-11-12 (×3): 2 via ORAL
  Filled 2011-11-11 (×2): qty 1
  Filled 2011-11-11 (×2): qty 2

## 2011-11-11 MED ORDER — MIDAZOLAM HCL 2 MG/2ML IJ SOLN
INTRAMUSCULAR | Status: AC
  Filled 2011-11-11: qty 2

## 2011-11-11 MED ORDER — BIVALIRUDIN 250 MG IV SOLR
INTRAVENOUS | Status: AC
  Filled 2011-11-11: qty 250

## 2011-11-11 MED ORDER — ASPIRIN 81 MG PO CHEW
CHEWABLE_TABLET | ORAL | Status: AC
  Filled 2011-11-11: qty 4

## 2011-11-11 MED ORDER — ALLOPURINOL 300 MG PO TABS
300.0000 mg | ORAL_TABLET | Freq: Every day | ORAL | Status: DC
  Administered 2011-11-12: 300 mg via ORAL
  Filled 2011-11-11 (×2): qty 1

## 2011-11-11 MED ORDER — SODIUM CHLORIDE 0.9 % IJ SOLN: 3.0000 mL | INTRAMUSCULAR | Status: DC | PRN

## 2011-11-11 MED ORDER — SODIUM CHLORIDE 0.9 % IJ SOLN: 3.0000 mL | Freq: Two times a day (BID) | INTRAMUSCULAR | Status: DC

## 2011-11-11 MED ORDER — INSULIN GLARGINE 100 UNIT/ML ~~LOC~~ SOLN
30.0000 [IU] | Freq: Every day | SUBCUTANEOUS | Status: DC
  Administered 2011-11-11: 30 [IU] via SUBCUTANEOUS
  Administered 2011-11-12: 40 [IU] via SUBCUTANEOUS
  Filled 2011-11-11: qty 3

## 2011-11-11 MED ORDER — TERAZOSIN HCL 5 MG PO CAPS
5.0000 mg | ORAL_CAPSULE | Freq: Every day | ORAL | Status: DC
  Administered 2011-11-11: 5 mg via ORAL
  Filled 2011-11-11 (×3): qty 1

## 2011-11-11 MED ORDER — MORPHINE SULFATE 2 MG/ML IJ SOLN: 1.0000 mg | INTRAMUSCULAR | Status: DC | PRN

## 2011-11-11 MED ORDER — FENTANYL CITRATE 0.05 MG/ML IJ SOLN
INTRAMUSCULAR | Status: AC
  Filled 2011-11-11: qty 2

## 2011-11-11 MED ORDER — LIDOCAINE HCL (PF) 1 % IJ SOLN
INTRAMUSCULAR | Status: AC
  Filled 2011-11-11: qty 30

## 2011-11-11 MED ORDER — NITROGLYCERIN 0.2 MG/ML ON CALL CATH LAB
INTRAVENOUS | Status: AC
  Filled 2011-11-11: qty 1

## 2011-11-11 MED ORDER — GLIMEPIRIDE 4 MG PO TABS
4.0000 mg | ORAL_TABLET | Freq: Two times a day (BID) | ORAL | Status: DC
  Administered 2011-11-11 – 2011-11-12 (×2): 4 mg via ORAL
  Filled 2011-11-11 (×5): qty 1

## 2011-11-11 MED ORDER — DIAZEPAM 5 MG PO TABS
ORAL_TABLET | ORAL | Status: AC
  Filled 2011-11-11: qty 1

## 2011-11-11 MED ORDER — ACETAMINOPHEN 325 MG PO TABS: 650.0000 mg | ORAL_TABLET | ORAL | Status: DC | PRN

## 2011-11-11 MED ORDER — VERAPAMIL HCL 2.5 MG/ML IV SOLN
INTRAVENOUS | Status: AC
  Filled 2011-11-11: qty 2

## 2011-11-11 MED ORDER — CLOPIDOGREL BISULFATE 75 MG PO TABS
75.0000 mg | ORAL_TABLET | Freq: Every day | ORAL | Status: DC
  Administered 2011-11-12: 75 mg via ORAL
  Filled 2011-11-11: qty 1

## 2011-11-11 MED ORDER — DIAZEPAM 5 MG PO TABS
5.0000 mg | ORAL_TABLET | ORAL | Status: AC
  Administered 2011-11-11: 5 mg via ORAL

## 2011-11-11 MED ORDER — SODIUM CHLORIDE 0.9 % IV SOLN
1.0000 mL/kg/h | INTRAVENOUS | Status: AC
  Administered 2011-11-11: 1 mL/kg/h via INTRAVENOUS

## 2011-11-11 MED ORDER — ONDANSETRON HCL 4 MG/2ML IJ SOLN: 4.0000 mg | Freq: Four times a day (QID) | INTRAMUSCULAR | Status: DC | PRN

## 2011-11-11 MED ORDER — SODIUM CHLORIDE 0.9 % IV SOLN: 250.0000 mL | INTRAVENOUS | Status: DC | PRN

## 2011-11-11 MED ORDER — ASPIRIN EC 325 MG PO TBEC
325.0000 mg | DELAYED_RELEASE_TABLET | Freq: Every day | ORAL | Status: DC
  Administered 2011-11-12: 325 mg via ORAL
  Filled 2011-11-11: qty 1

## 2011-11-11 MED ORDER — ASPIRIN 81 MG PO CHEW
324.0000 mg | CHEWABLE_TABLET | ORAL | Status: AC
  Administered 2011-11-11: 324 mg via ORAL

## 2011-11-11 MED ORDER — NISOLDIPINE ER 8.5 MG PO TB24
17.0000 mg | ORAL_TABLET | Freq: Every day | ORAL | Status: DC
  Administered 2011-11-12: 17 mg via ORAL
  Filled 2011-11-11 (×2): qty 2

## 2011-11-11 MED ORDER — SODIUM CHLORIDE 0.9 % IV SOLN
INTRAVENOUS | Status: DC
  Administered 2011-11-11: 08:00:00 via INTRAVENOUS

## 2011-11-11 MED ORDER — POTASSIUM CHLORIDE 20 MEQ/15ML (10%) PO LIQD
5.0000 meq | Freq: Every day | ORAL | Status: DC
  Administered 2011-11-12: 5.0667 meq via ORAL
  Filled 2011-11-11 (×2): qty 7.5

## 2011-11-11 MED ORDER — HEPARIN (PORCINE) IN NACL 2-0.9 UNIT/ML-% IJ SOLN
INTRAMUSCULAR | Status: AC
  Filled 2011-11-11: qty 2000

## 2011-11-11 NOTE — Brief Op Note (Signed)
11/11/2011  10:10 AM  PATIENT:  Keith Espinoza  71 y.o. male  PRE-OPERATIVE DIAGNOSIS:  CAD  POST-OPERATIVE DIAGNOSIS:  * No post-op diagnosis entered *  PROCEDURE:  Procedure(s): PERCUTANEOUS CORONARY STENT INTERVENTION (PCI-S)  SURGEON:  Surgeon(s): Corky Crafts, MD  50-60% proximal RCA disease.    Patent RCA stent.  95% mid circumflex lesion. PTCA to mid to distal circumflex. Stent to mid circ into the OM2  No residual stenosis in the mid circumflex.  Both branches patent.  VARANASI,JAYADEEP S. 11/11/2011

## 2011-11-11 NOTE — H&P (Signed)
  Date of Initial H&P: 10/19/11  History reviewed, patient examined, no change in status, stable for surgery.  VARANASI,JAYADEEP S. 11/11/2011

## 2011-11-11 NOTE — Op Note (Signed)
PROCEDURE:   coronary angiography, PCI of the mid circumflex.  INDICATIONS:  Angina, abnormal stress test, coronary artery disease  The risks, benefits, and details of the procedure were explained to the patient.  The patient verbalized understanding and wanted to proceed.  Informed written consent was obtained.  PROCEDURE TECHNIQUE:  After Xylocaine anesthesia a 67F sheath was placed in the right radial artery with a single anterior needle wall stick.   Left coronary angiography was done using a Judkins L4 guide catheter.  Right coronary angiography was done using a Judkins R4 guide catheter.  Left ventriculography was done using a pigtail catheter.    CONTRAST:  Total of 100 cc.  COMPLICATIONS:  None.      ANGIOGRAPHIC DATA:   The left main coronary artery is short, widely patent.  The left anterior descending artery is patent.  The left circumflex artery is patent proximally.  The mid vessel stent is patent.  Past the stent, there is a 95% stenosis before the bifurcation with OM 2.  The right coronary artery is a medium size dominant vessel.  The mid vessel stent is patent.  There is a 50-60% proximal area of disease at a bend.  PCI NARRATIVE: A CLS 3.0 guiding catheter used to engage the left main.  A pro-water wire was placed into the circumflex system past the area of disease into the OM 3.  A BMW wire was placed into the OM 2.  A 2.0 x 20 balloon was used to predilate the mid circumflex into the OM 3 branch.  A 2.25 x 20 Promus elements stent was then placed into the circumflex system and into the OM 2.  This was deployed across the opening to the distal circumflex.  The pro-water wire had been removed from the OM 3.  A new Proler wire was placed into the OM 3.  A 2.5 x 12 noncompliant balloon was used to post dilate the proximal stented area in the mid circumflex. There is no residual stenosis.  Flow was maintained in the branch protected by the pro-water.  Intracoronary nitroglycerin  was used to treat spasm in the OM 2 which is caused by the stent.  This was successful.  Lesion length was 15 mm.  TIMI-3 flow was maintained.  IMPRESSIONS:  1. Successful PCI of the mid circumflex with drug-eluting stent, postdilated to 2.6 mm in diameter. 2.   moderate right coronary artery disease.  RECOMMENDATION:  Continue dual antiplatelet therapy.  Medical therapy for the disease in his right coronary artery.  Will follow with stress test in a year to see if there's been any progression of that disease.  He'll be watched overnight.  Plan discharge tomorrow assuming no complications.

## 2011-11-12 ENCOUNTER — Encounter (HOSPITAL_COMMUNITY): Payer: Self-pay | Admitting: Dietician

## 2011-11-12 ENCOUNTER — Other Ambulatory Visit: Payer: Self-pay

## 2011-11-12 DIAGNOSIS — F17200 Nicotine dependence, unspecified, uncomplicated: Secondary | ICD-10-CM | POA: Diagnosis not present

## 2011-11-12 DIAGNOSIS — R079 Chest pain, unspecified: Secondary | ICD-10-CM | POA: Diagnosis not present

## 2011-11-12 DIAGNOSIS — R9430 Abnormal result of cardiovascular function study, unspecified: Secondary | ICD-10-CM | POA: Diagnosis not present

## 2011-11-12 DIAGNOSIS — I2510 Atherosclerotic heart disease of native coronary artery without angina pectoris: Secondary | ICD-10-CM | POA: Diagnosis not present

## 2011-11-12 LAB — BASIC METABOLIC PANEL
BUN: 18 mg/dL (ref 6–23)
CO2: 28 mEq/L (ref 19–32)
Calcium: 9.6 mg/dL (ref 8.4–10.5)
Chloride: 105 mEq/L (ref 96–112)
Creatinine, Ser: 0.95 mg/dL (ref 0.50–1.35)
GFR calc Af Amer: >90 mL/min (ref >90)
GFR calc non Af Amer: 82 mL/min — ABNORMAL LOW (ref >90)
Glucose, Bld: 75 mg/dL (ref 70–99)
Potassium: 3.3 mEq/L — ABNORMAL LOW (ref 3.5–5.1)
Sodium: 142 mEq/L (ref 135–145)

## 2011-11-12 LAB — CBC
HCT: 33.5 % — ABNORMAL LOW (ref 39.0–52.0)
Hemoglobin: 11.9 g/dL — ABNORMAL LOW (ref 13.0–17.0)
MCH: 30.7 pg (ref 26.0–34.0)
MCHC: 35.5 g/dL (ref 30.0–36.0)
MCV: 86.3 fL (ref 78.0–100.0)
Platelets: 118 10*3/uL — ABNORMAL LOW (ref 150–400)
RBC: 3.88 MIL/uL — ABNORMAL LOW (ref 4.22–5.81)
RDW: 14.3 % (ref 11.5–15.5)
WBC: 5.2 10*3/uL (ref 4.0–10.5)

## 2011-11-12 LAB — POCT ACTIVATED CLOTTING TIME: Activated Clotting Time: 430 seconds

## 2011-11-12 LAB — GLUCOSE, CAPILLARY: Glucose-Capillary: 156 mg/dL — ABNORMAL HIGH (ref 70–99)

## 2011-11-12 MED ORDER — METOPROLOL TARTRATE 25 MG PO TABS
25.0000 mg | ORAL_TABLET | Freq: Once | ORAL | Status: AC
  Administered 2011-11-12: 25 mg via ORAL
  Filled 2011-11-12: qty 1

## 2011-11-12 MED ORDER — METFORMIN HCL 1000 MG PO TABS: 1000.0000 mg | ORAL_TABLET | Freq: Two times a day (BID) | ORAL | Status: DC

## 2011-11-12 MED FILL — Dextrose Inj 5%: INTRAVENOUS | Qty: 50 | Status: AC

## 2011-11-12 NOTE — Progress Notes (Signed)
Pt had 22 beats of V-Tach for 10 seconds, remain asymptomatic , BP 123/51 HR 56 bpm  Dr Shirlee Latch on call for Dr Eldridge Dace  Paged and new order received.

## 2011-11-12 NOTE — Consult Note (Signed)
Pt chews 1/2 pack of chewing tobacco per day. He quit smoking in the 70's. Discussed risk factors of chewing tobacco with the pt. Pt says he's reduced from 1 ppd to 1/2 ppd. Encouraged pt to continue reducing as much as he can but he will not quit cold [place] or completely. Referred to 1-800 quit now for f/u and support. Discussed oral fixation substitutes, second hand smoke and in home smoking policy. Reviewed and gave pt Written education/contact information.

## 2011-11-12 NOTE — Progress Notes (Addendum)
CARDIAC REHAB PHASE I   PRE:  Rate/Rhythm: 56 SB    BP: sitting 111/37    SaO2:   MODE:  Ambulation: 500 ft   POST:  Rate/Rhythm: 69    BP: sitting 121/75     SaO2:   Tolerated well. C/o back and hip pain. Reviewed ed. Pt set in his ways. Not very interested in tobacco cessation but is planning to begin CRPII with his wife (rehab scheduling pt and wife already). Pt sts he does not carry his NTG with him. [id number]  Harriet Masson CES, ACSM

## 2011-11-12 NOTE — Discharge Summary (Addendum)
Patient ID: MONTANA FASSNACHT MRN: [id number] DOB/AGE: [birth year] 71 y.o.  Admit date: 11/11/2011 Discharge date: 11/12/2011  Primary Discharge Diagnosis coronary artery disease Secondary Discharge Diagnosis tobacco abuse, diabetes  Significant Diagnostic Studies: angiography: Cardiac cath with moderate RCA disease.  Drug-eluting stent to the 95% mid circumflex lesion.  PTCA in the distal circumflex.  Consults: None  Hospital Course: The patient was admitted after undergoing cardiac catheterization from the right radial approach.  He tolerated the procedure well.  He walks with cardiac rehabilitation and after the procedure and had no chest discomfort.  He had a palpable right radial pulse the day after the procedure.  He continues to have back pain and wants to be evaluated for that as an outpatient.  [facility]  He was instructed to hold his metformin until Saturday, February 9.  He did have a short run of nonsustained ventricular tachycardia.  He was asymptomatic.  His resting heart rate is in the 50s so he has not been on regular doses of beta blocker.  Potassium was slightly low.  We'll have the patient double his dose of liquid potassium at home for 2 days.  He does not like to take the large tablets.    Discharge Exam: Blood pressure 111/54, pulse 72, temperature 98 F (36.7 C), temperature source Oral, resp. rate 14, height 5\' 9"  (1.753 m), weight 94.802 kg (209 lb), SpO2 97.00%.  [place]/AT No JVD Regular rate and rhythm No wheezing 2+ right radial pulse, no hematoma No lower extremity edema   Labs:   Lab Results  Component Value Date   WBC 5.2 11/12/2011   HGB 11.9* 11/12/2011   HCT 33.5* 11/12/2011   MCV 86.3 11/12/2011   PLT 118* 11/12/2011    Lab 11/12/11 0435  NA 142  K 3.3*  CL 105  CO2 28  BUN 18  CREATININE 0.95  CALCIUM 9.6  PROT --  BILITOT --  ALKPHOS --  ALT --  AST --  GLUCOSE 75   No results found for this basename: CKTOTAL,  CKMB, CKMBINDEX, TROPONINI    No results found for this basename: CHOL   No results found for this basename: HDL   No results found for this basename: LDLCALC   No results found for this basename: TRIG   No results found for this basename: CHOLHDL   No results found for this basename: LDLDIRECT      Radiology: None EKG: Normal sinus rhythm, no ST segment changes.  FOLLOW UP PLANS AND APPOINTMENTS  Medication List  As of 11/12/2011  8:45 AM   TAKE these medications         allopurinol 300 MG tablet   Commonly known as: ZYLOPRIM   Take 300 mg by mouth daily.      aspirin EC 325 MG tablet   Take 325 mg by mouth daily.      clopidogrel 75 MG tablet   Commonly known as: PLAVIX   Take 75 mg by mouth daily with breakfast.      glimepiride 4 MG tablet   Commonly known as: AMARYL   Take 4 mg by mouth 2 (two) times daily.      HYDROcodone-acetaminophen 5-325 MG per tablet   Commonly known as: NORCO   Take 1-2 tablets by mouth every 6 (six) hours as needed. For pain      insulin glargine 100 UNIT/ML injection   Commonly known as: LANTUS   Inject 30-50 Units into the  skin daily. Sliding scale      metFORMIN 1000 MG tablet   Commonly known as: GLUCOPHAGE   Take 1 tablet (1,000 mg total) by mouth 2 (two) times daily.   Start taking on: 11/13/2011      nisoldipine 20 MG 24 hr tablet   Commonly known as: SULAR   Take 20 mg by mouth daily.      Potassium Gluconate 550 MG Tabs   Take 1 tablet by mouth daily.      telmisartan-hydrochlorothiazide 80-12.5 MG per tablet   Commonly known as: MICARDIS HCT   Take 1 tablet by mouth daily.      terazosin 5 MG capsule   Commonly known as: HYTRIN   Take 5 mg by mouth at bedtime.           Follow-up Information    Follow up with Corky Crafts., MD. (2/22 11:15 AM)    Contact information:   [address] [place] [address] [phone]          BRING ALL MEDICATIONS WITH YOU TO FOLLOW UP  APPOINTMENTS  Time spent with patient to include physician time: 25 minutes Signed: VARANASI,JAYADEEP S. 11/12/2011, 8:45 AM

## 2011-11-15 ENCOUNTER — Other Ambulatory Visit: Payer: Self-pay

## 2011-11-15 ENCOUNTER — Encounter (HOSPITAL_COMMUNITY): Payer: Self-pay | Admitting: Emergency Medicine

## 2011-11-15 ENCOUNTER — Encounter (HOSPITAL_COMMUNITY)
Admission: EM | Disposition: A | Discharge status: Home / Self Care | Payer: Self-pay | Source: Ambulatory Visit | Attending: Interventional Cardiology

## 2011-11-15 ENCOUNTER — Emergency Department (HOSPITAL_COMMUNITY): Payer: Medicare Other

## 2011-11-15 ENCOUNTER — Inpatient Hospital Stay (HOSPITAL_COMMUNITY)
Admission: EM | Admit: 2011-11-15 | Discharge: 2011-11-17 | DRG: 247 | Disposition: A | Discharge status: Home / Self Care | Payer: Medicare Other | Source: Ambulatory Visit | Attending: Interventional Cardiology | Admitting: Interventional Cardiology

## 2011-11-15 DIAGNOSIS — Z96649 Presence of unspecified artificial hip joint: Secondary | ICD-10-CM | POA: Diagnosis not present

## 2011-11-15 DIAGNOSIS — E119 Type 2 diabetes mellitus without complications: Secondary | ICD-10-CM | POA: Diagnosis present

## 2011-11-15 DIAGNOSIS — I10 Essential (primary) hypertension: Secondary | ICD-10-CM | POA: Diagnosis present

## 2011-11-15 DIAGNOSIS — M109 Gout, unspecified: Secondary | ICD-10-CM | POA: Diagnosis present

## 2011-11-15 DIAGNOSIS — I214 Non-ST elevation (NSTEMI) myocardial infarction: Secondary | ICD-10-CM

## 2011-11-15 DIAGNOSIS — R079 Chest pain, unspecified: Secondary | ICD-10-CM | POA: Diagnosis not present

## 2011-11-15 DIAGNOSIS — I201 Angina pectoris with documented spasm: Secondary | ICD-10-CM | POA: Diagnosis not present

## 2011-11-15 DIAGNOSIS — Z7982 Long term (current) use of aspirin: Secondary | ICD-10-CM

## 2011-11-15 DIAGNOSIS — G8929 Other chronic pain: Secondary | ICD-10-CM | POA: Diagnosis present

## 2011-11-15 DIAGNOSIS — Z794 Long term (current) use of insulin: Secondary | ICD-10-CM | POA: Diagnosis not present

## 2011-11-15 DIAGNOSIS — Z9861 Coronary angioplasty status: Secondary | ICD-10-CM

## 2011-11-15 DIAGNOSIS — I2510 Atherosclerotic heart disease of native coronary artery without angina pectoris: Secondary | ICD-10-CM | POA: Diagnosis not present

## 2011-11-15 DIAGNOSIS — Z7902 Long term (current) use of antithrombotics/antiplatelets: Secondary | ICD-10-CM | POA: Diagnosis not present

## 2011-11-15 DIAGNOSIS — F17200 Nicotine dependence, unspecified, uncomplicated: Secondary | ICD-10-CM | POA: Diagnosis present

## 2011-11-15 DIAGNOSIS — I2129 ST elevation (STEMI) myocardial infarction involving other sites: Secondary | ICD-10-CM | POA: Diagnosis present

## 2011-11-15 DIAGNOSIS — M545 Low back pain, unspecified: Secondary | ICD-10-CM | POA: Diagnosis present

## 2011-11-15 DIAGNOSIS — Z79899 Other long term (current) drug therapy: Secondary | ICD-10-CM | POA: Diagnosis not present

## 2011-11-15 DIAGNOSIS — E876 Hypokalemia: Secondary | ICD-10-CM | POA: Diagnosis not present

## 2011-11-15 HISTORY — PX: LEFT HEART CATHETERIZATION WITH CORONARY ANGIOGRAM: SHX5451

## 2011-11-15 LAB — CBC
HCT: 38.7 % — ABNORMAL LOW (ref 39.0–52.0)
Hemoglobin: 14 g/dL (ref 13.0–17.0)
MCH: 31 pg (ref 26.0–34.0)
MCHC: 36.2 g/dL — ABNORMAL HIGH (ref 30.0–36.0)
MCV: 85.8 fL (ref 78.0–100.0)
Platelets: 139 10*3/uL — ABNORMAL LOW (ref 150–400)
RBC: 4.51 MIL/uL (ref 4.22–5.81)
RDW: 14.4 % (ref 11.5–15.5)
WBC: 6.9 10*3/uL (ref 4.0–10.5)

## 2011-11-15 LAB — COMPREHENSIVE METABOLIC PANEL
ALT: 25 U/L (ref 0–53)
AST: 71 U/L — ABNORMAL HIGH (ref 0–37)
Albumin: 4.4 g/dL (ref 3.5–5.2)
Alkaline Phosphatase: 97 U/L (ref 39–117)
BUN: 17 mg/dL (ref 6–23)
CO2: 27 mEq/L (ref 19–32)
Calcium: 10.6 mg/dL — ABNORMAL HIGH (ref 8.4–10.5)
Chloride: 104 mEq/L (ref 96–112)
Creatinine, Ser: 0.94 mg/dL (ref 0.50–1.35)
GFR calc Af Amer: >90 mL/min (ref >90)
GFR calc non Af Amer: 82 mL/min — ABNORMAL LOW (ref >90)
Glucose, Bld: 111 mg/dL — ABNORMAL HIGH (ref 70–99)
Potassium: 3.5 mEq/L (ref 3.5–5.1)
Sodium: 144 mEq/L (ref 135–145)
Total Bilirubin: 0.7 mg/dL (ref 0.3–1.2)
Total Protein: 7.9 g/dL (ref 6.0–8.3)

## 2011-11-15 LAB — DIFFERENTIAL
Basophils Absolute: 0 10*3/uL (ref 0.0–0.1)
Basophils Relative: 0 % (ref 0–1)
Eosinophils Absolute: 0.3 10*3/uL (ref 0.0–0.7)
Eosinophils Relative: 4 % (ref 0–5)
Lymphocytes Relative: 18 % (ref 12–46)
Lymphs Abs: 1.3 10*3/uL (ref 0.7–4.0)
Monocytes Absolute: 0.5 10*3/uL (ref 0.1–1.0)
Monocytes Relative: 7 % (ref 3–12)
Neutro Abs: 4.9 10*3/uL (ref 1.7–7.7)
Neutrophils Relative %: 70 % (ref 43–77)

## 2011-11-15 LAB — CARDIAC PANEL(CRET KIN+CKTOT+MB+TROPI)
CK, MB: 48.3 ng/mL (ref 0.3–4.0)
Relative Index: 11.1 — ABNORMAL HIGH (ref 0.0–2.5)
Total CK: 436 U/L — ABNORMAL HIGH (ref 7–232)
Troponin I: 20.69 ng/mL (ref <0.30)

## 2011-11-15 LAB — TROPONIN I: Troponin I: 21.51 ng/mL (ref <0.30)

## 2011-11-15 LAB — GLUCOSE, CAPILLARY: Glucose-Capillary: 142 mg/dL — ABNORMAL HIGH (ref 70–99)

## 2011-11-15 LAB — POCT ACTIVATED CLOTTING TIME: Activated Clotting Time: 424 seconds

## 2011-11-15 LAB — PROTIME-INR
INR: 1.03 (ref 0.00–1.49)
Prothrombin Time: 13.7 seconds (ref 11.6–15.2)

## 2011-11-15 SURGERY — PERCUTANEOUS CORONARY STENT INTERVENTION (PCI-S)

## 2011-11-15 MED ORDER — ASPIRIN 81 MG PO CHEW
324.0000 mg | CHEWABLE_TABLET | Freq: Once | ORAL | Status: DC
  Filled 2011-11-15: qty 4

## 2011-11-15 MED ORDER — OLMESARTAN MEDOXOMIL 40 MG PO TABS
40.0000 mg | ORAL_TABLET | Freq: Every day | ORAL | Status: DC
  Administered 2011-11-16: 40 mg via ORAL
  Filled 2011-11-15 (×2): qty 1

## 2011-11-15 MED ORDER — GLIMEPIRIDE 4 MG PO TABS
4.0000 mg | ORAL_TABLET | Freq: Two times a day (BID) | ORAL | Status: DC
  Administered 2011-11-16 – 2011-11-17 (×3): 4 mg via ORAL
  Filled 2011-11-15 (×6): qty 1

## 2011-11-15 MED ORDER — MORPHINE SULFATE 2 MG/ML IJ SOLN: 1.0000 mg | INTRAMUSCULAR | Status: DC | PRN

## 2011-11-15 MED ORDER — METOPROLOL TARTRATE 12.5 MG HALF TABLET
12.5000 mg | ORAL_TABLET | Freq: Two times a day (BID) | ORAL | Status: DC
  Administered 2011-11-15 – 2011-11-16 (×3): 12.5 mg via ORAL
  Filled 2011-11-15 (×5): qty 1

## 2011-11-15 MED ORDER — HEPARIN (PORCINE) IN NACL 2-0.9 UNIT/ML-% IJ SOLN
INTRAMUSCULAR | Status: AC
  Filled 2011-11-15: qty 2000

## 2011-11-15 MED ORDER — INSULIN ASPART 100 UNIT/ML ~~LOC~~ SOLN
0.0000 [IU] | SUBCUTANEOUS | Status: DC
  Filled 2011-11-15: qty 3

## 2011-11-15 MED ORDER — CLOPIDOGREL BISULFATE 75 MG PO TABS
75.0000 mg | ORAL_TABLET | Freq: Every day | ORAL | Status: DC
  Administered 2011-11-16 – 2011-11-17 (×2): 75 mg via ORAL
  Filled 2011-11-15 (×3): qty 1

## 2011-11-15 MED ORDER — ACETAMINOPHEN 325 MG PO TABS: 650.0000 mg | ORAL_TABLET | ORAL | Status: DC | PRN

## 2011-11-15 MED ORDER — TELMISARTAN-HCTZ 80-12.5 MG PO TABS: 1.0000 | ORAL_TABLET | Freq: Every day | ORAL | Status: DC

## 2011-11-15 MED ORDER — POTASSIUM GLUCONATE 550 MG PO TABS: 1.0000 | ORAL_TABLET | Freq: Every day | ORAL | Status: DC

## 2011-11-15 MED ORDER — VERAPAMIL HCL 2.5 MG/ML IV SOLN
INTRAVENOUS | Status: AC
  Filled 2011-11-15: qty 2

## 2011-11-15 MED ORDER — ONDANSETRON HCL 4 MG/2ML IJ SOLN: 4.0000 mg | Freq: Four times a day (QID) | INTRAMUSCULAR | Status: DC | PRN

## 2011-11-15 MED ORDER — MIDAZOLAM HCL 2 MG/2ML IJ SOLN
INTRAMUSCULAR | Status: AC
  Filled 2011-11-15: qty 2

## 2011-11-15 MED ORDER — HYDROCODONE-ACETAMINOPHEN 5-325 MG PO TABS
1.0000 | ORAL_TABLET | Freq: Four times a day (QID) | ORAL | Status: DC | PRN
  Administered 2011-11-15: 1 via ORAL
  Administered 2011-11-16: 2 via ORAL
  Administered 2011-11-16: 1 via ORAL
  Administered 2011-11-16: 2 via ORAL
  Filled 2011-11-15: qty 1
  Filled 2011-11-15 (×2): qty 2
  Filled 2011-11-15 (×2): qty 1

## 2011-11-15 MED ORDER — LIDOCAINE HCL (PF) 1 % IJ SOLN
INTRAMUSCULAR | Status: AC
  Filled 2011-11-15: qty 30

## 2011-11-15 MED ORDER — TERAZOSIN HCL 5 MG PO CAPS
5.0000 mg | ORAL_CAPSULE | Freq: Every day | ORAL | Status: DC
  Administered 2011-11-15 – 2011-11-16 (×2): 5 mg via ORAL
  Filled 2011-11-15 (×3): qty 1

## 2011-11-15 MED ORDER — FENTANYL CITRATE 0.05 MG/ML IJ SOLN
INTRAMUSCULAR | Status: AC
  Filled 2011-11-15: qty 2

## 2011-11-15 MED ORDER — SODIUM CHLORIDE 0.9 % IV SOLN
INTRAVENOUS | Status: AC
  Administered 2011-11-15: 21:00:00 via INTRAVENOUS

## 2011-11-15 MED ORDER — NISOLDIPINE ER 17 MG PO TB24
17.0000 mg | ORAL_TABLET | Freq: Every day | ORAL | Status: DC
  Administered 2011-11-15 – 2011-11-16 (×2): 17 mg via ORAL
  Filled 2011-11-15 (×3): qty 1

## 2011-11-15 MED ORDER — HYDROCHLOROTHIAZIDE 12.5 MG PO CAPS
12.5000 mg | ORAL_CAPSULE | Freq: Every day | ORAL | Status: DC
  Administered 2011-11-16: 12.5 mg via ORAL
  Filled 2011-11-15 (×2): qty 1

## 2011-11-15 MED ORDER — ASPIRIN EC 325 MG PO TBEC
325.0000 mg | DELAYED_RELEASE_TABLET | Freq: Every day | ORAL | Status: DC
  Administered 2011-11-16 – 2011-11-17 (×2): 325 mg via ORAL
  Filled 2011-11-15 (×3): qty 1

## 2011-11-15 MED ORDER — ALLOPURINOL 300 MG PO TABS
300.0000 mg | ORAL_TABLET | Freq: Every day | ORAL | Status: DC
  Administered 2011-11-16: 300 mg via ORAL
  Filled 2011-11-15 (×2): qty 1

## 2011-11-15 MED ORDER — INSULIN GLARGINE 100 UNIT/ML ~~LOC~~ SOLN
30.0000 [IU] | Freq: Every day | SUBCUTANEOUS | Status: DC
  Administered 2011-11-16: 30 [IU] via SUBCUTANEOUS
  Filled 2011-11-15: qty 3

## 2011-11-15 MED ORDER — NISOLDIPINE ER 20 MG PO TB24: 20.0000 mg | ORAL_TABLET | Freq: Every evening | ORAL | Status: DC

## 2011-11-15 MED ORDER — BIVALIRUDIN 250 MG IV SOLR
INTRAVENOUS | Status: AC
  Filled 2011-11-15: qty 250

## 2011-11-15 MED ORDER — HEPARIN SODIUM (PORCINE) 1000 UNIT/ML IJ SOLN
INTRAMUSCULAR | Status: AC
  Filled 2011-11-15: qty 1

## 2011-11-15 MED ORDER — NITROGLYCERIN 0.2 MG/ML ON CALL CATH LAB
INTRAVENOUS | Status: AC
  Filled 2011-11-15: qty 1

## 2011-11-15 NOTE — ED Notes (Signed)
Pt undressed and in gown. Cardiac monitor, blood pressure cuff and pulse oximetry on.

## 2011-11-15 NOTE — ED Notes (Signed)
Chest pain started last night no sob or n/v

## 2011-11-15 NOTE — Op Note (Addendum)
PROCEDURE:  Left heart catheterization with selective coronary angiography, PCI of the OM 2 and distal circumflex INDICATIONS:    The risks, benefits, and details of the procedure were explained to the patient.  The patient verbalized understanding and wanted to proceed.  Informed written consent was obtained.  PROCEDURE TECHNIQUE:  After Xylocaine anesthesia a 28F sheath was placed in the right femoral artery with a single anterior needle wall stick.   Left coronary angiography was done using a Judkins L4 guide catheter.  Right coronary angiography was done using a Judkins R4 guide catheter.  Left ventriculography was done using a pigtail catheter.    CONTRAST:  Total of 160 cc.  COMPLICATIONS:  None.    HEMODYNAMICS:  Aortic pressure was 155/70; LV pressure was 152/2; LVEDP 8.  There was no gradient between the left ventricle and aorta.    ANGIOGRAPHIC DATA:   The left main coronary artery is widely patent.  The left anterior descending artery is a large vessel which reaches the apex.  The mid vessel stent is patent.  There 2 diagonal vessels which are patent.  The left circumflex artery is a large vessel proximally.  The mid vessel stents are widely patent.  Just before the stented area, there is a 25% lesion.  The stent extending into the OM 2 is patent.  In the more distal portion of the OM 2 there is a 70% lesion.  The remainder of the circumflex has TIMI 1 flow and appears subtotally occluded in the distal portion.  The right coronary artery is a large dominant vessel.  There is a proximal 50-60% lesion.  The mid vessel stented area is widely patent.  There is moderate diffuse disease in the PDA and posterolateral arteries.Marland Kitchen  PCI NARRATIVE:  A CLS 3.0 guiding catheter was used to engage the left main coronary artery.  Angiomax was used for anticoagulation.  Pro water wire was placed into the distal circumflex.  A 2.0 x 15 emerge balloon was used to perform balloon inflation which restored  TIMI-3 flow.  There was some residual dissection noted in the vessel with TIMI 3 flow was maintained.  The wire was redirected down the OM 2.  The same balloon was used for predilatation.  A 2.25 x 12 Promus stent was then placed across the stenosis in deployed at 10 atmospheres.  A 2.25 x 8 [place] in inflated to 12 atmospheres there is no residual stenosis.  TIMI 3 flow was maintained in the OM 2.  IC NTG was used to treat coronary spasm.   IMPRESSIONS:  1. Successful drug-eluting stent placement to the mid OM 2.  This was post dilated to 2.3 mm in diameter. 2. Moderate right coronary artery disease. 3. Successful balloon angioplasty to the distal circumflex restoring TIMI-3 flow.  There is a residual dissection noted in this vessel but flow remains normal and the patient is pain-free.  The distal circumflex was too small to stent.  RECOMMENDATION:  He will be watched in the CCU.  We'll cycle cardiac enzymes.  Continue aggressive secondary prevention.  We'll check p2y12 testing.

## 2011-11-15 NOTE — ED Provider Notes (Signed)
D/w dr Eldridge Dace who knows this patient He called about patient and will see him +troponin noted and he is aware of this EKG shows no significant change BP 151/77  Pulse 59  Temp 98.4 F (36.9 C)  Resp 17  SpO2 97%   Joya Gaskins, MD 11/15/11 1733

## 2011-11-15 NOTE — ED Notes (Addendum)
Pt reports chest pain since last night that is substernal pressure.  Pt states he has 5 stents

## 2011-11-15 NOTE — ED Provider Notes (Signed)
History     CSN: [id number]  Arrival date & time 11/15/11  1508   First MD Initiated Contact with Patient 11/15/11 1730      Chief Complaint  Patient presents with  . Chest Pain   HPI Pt presents with 5/10, pressure like, non radiating CP that he describes feeling like indigestion.  Pain is from bottom of sternum up 5cm.  Not tender.  No SOB/vomiting.  Has had decreased appetite since yesterday.  Only had coffee this AM.  Had ASA and plavix this am.  Tried NTG and tums yesterday without relief.  Pain began at 8pm last night, while pt sitting in recliner.  Recently discharged from hospital (11/12/11) after coronary stent placement.     Past Medical History  Diagnosis Date  . Heart murmur   . Coronary artery disease   . Hypertension   . Arthritis   . Diabetes mellitus     Insulin dependent  . Dysrhythmia   . H/O hiatal hernia   . Chronic lower back pain   . Chronic leg pain     right    Past Surgical History  Procedure Date  . Coronary angioplasty with stent placement 09/30/2011    "1 then; makes a total of 4"  . Coronary angioplasty with stent placement 11/11/11    "1; makes a total of 5"  . Coronary angioplasty 11/11/11  . Knee arthroscopy 1990's    left  . Total hip arthroplasty 04/03/2002    right  . Joint replacement 04/03/2002    right hip replacment  . Tonsillectomy ~ 1948  . Cholecystectomy 1990's  . Inguinal hernia repair 2003    right  . Back surgery     "total of 3 times" S/P fall     No family history on file.  History  Substance Use Topics  . Smoking status: Former Smoker -- 0.5 packs/day for 3 years    Types: Cigarettes    Quit date: 11/11/1973  . Smokeless tobacco: Current User    Types: Chew   Comment: quit smoking 1970's  . Alcohol Use: No     Review of Systems  Constitutional: Positive for appetite change. Negative for fever, chills, diaphoresis and fatigue.  HENT: Negative for congestion, rhinorrhea, trouble swallowing and neck pain.   Eyes:  Negative for visual disturbance.  Respiratory: Positive for chest tightness. Negative for cough, shortness of breath and wheezing.   Cardiovascular: Positive for chest pain. Negative for palpitations and leg swelling.  Gastrointestinal: Positive for nausea. Negative for vomiting, abdominal pain, diarrhea and constipation.  Genitourinary: Negative.   Musculoskeletal: Negative.   Skin: Negative.   Neurological: Positive for headaches. Negative for dizziness, seizures, syncope, weakness, light-headedness and numbness.       Intermittent posterior HA since yesterday  Psychiatric/Behavioral: Negative.     Allergies  Review of patient's allergies indicates no known allergies.  Home Medications   Current Outpatient Rx  Name Route Sig Dispense Refill  . ALLOPURINOL 300 MG PO TABS Oral Take 300 mg by mouth daily.     . ASPIRIN EC 325 MG PO TBEC Oral Take 325 mg by mouth daily.     Marland Kitchen CLOPIDOGREL BISULFATE 75 MG PO TABS Oral Take 75 mg by mouth daily with breakfast.    . GLIMEPIRIDE 4 MG PO TABS Oral Take 4 mg by mouth 2 (two) times daily.     Marland Kitchen HYDROCODONE-ACETAMINOPHEN 5-325 MG PO TABS Oral Take 1-2 tablets by mouth every 6 (six) hours as  needed. For pain    . INSULIN GLARGINE 100 UNIT/ML [place] SOLN Subcutaneous Inject 30-50 Units into the skin daily. Sliding scale    . METFORMIN HCL 1000 MG PO TABS Oral Take 1 tablet (1,000 mg total) by mouth 2 (two) times daily.    Marland Kitchen NISOLDIPINE ER 20 MG PO TB24 Oral Take 20 mg by mouth every evening.     Marland Kitchen POTASSIUM GLUCONATE 550 MG PO TABS Oral Take 1 tablet by mouth daily.    . TELMISARTAN-HCTZ 80-12.5 MG PO TABS Oral Take 1 tablet by mouth daily.     Marland Kitchen TERAZOSIN HCL 5 MG PO CAPS Oral Take 5 mg by mouth at bedtime.       BP 151/77  Pulse 59  Temp 98.4 F (36.9 C)  Resp 17  SpO2 97%  Physical Exam  Vitals reviewed. Constitutional: He is oriented to person, place, and time. He appears well-developed and well-nourished. No distress.  HENT:    Mouth/Throat: Oropharynx is clear and moist.  Eyes: Conjunctivae and EOM are normal. Pupils are equal, round, and reactive to light.  Neck: Normal range of motion. Neck supple. No tracheal deviation present. No thyromegaly present.  Cardiovascular: Normal rate, regular rhythm, normal heart sounds and intact distal pulses.  Exam reveals no gallop and no friction rub.   No murmur heard.      Not TTP  Pulmonary/Chest: Effort normal and breath sounds normal. No respiratory distress. He has no wheezes. He has no rales. He exhibits no tenderness.  Abdominal: Soft. Bowel sounds are normal. He exhibits no distension and no mass. There is no tenderness. There is no rebound and no guarding.  Musculoskeletal: Normal range of motion. He exhibits no edema and no tenderness.  Neurological: He is alert and oriented to person, place, and time. No cranial nerve deficit. Coordination normal.  Skin: Skin is warm and dry. No rash noted. He is not diaphoretic. No erythema. No pallor.  Psychiatric: He has a normal mood and affect. His behavior is normal.    ED Course  Procedures (including critical care time)  Labs Reviewed  CBC - Abnormal; Notable for the following:    HCT 38.7 (*)    MCHC 36.2 (*)    Platelets 139 (*)    All other components within normal limits  COMPREHENSIVE METABOLIC PANEL - Abnormal; Notable for the following:    Glucose, Bld 111 (*)    Calcium 10.6 (*)    AST 71 (*) HEMOLYSIS AT THIS LEVEL MAY AFFECT RESULT   GFR calc non Af Amer 82 (*)    All other components within normal limits  TROPONIN I - Abnormal; Notable for the following:    Troponin I 21.51 (*)    All other components within normal limits  DIFFERENTIAL  PROTIME-INR   Dg Chest 2 View  11/15/2011  *RADIOLOGY REPORT*  Clinical Data: Chest pain.  Coronary artery disease.  CHEST - 2 VIEW  Comparison: 07/13/2007  Findings: Heart size and vascularity are normal and the lungs are clear.  Coronary artery stent visible.  No  effusions.  No osseous abnormalities.  IMPRESSION: No acute disease.  Original Report Authenticated By: Gwynn Burly, M.D.    Date: 11/15/2011  Rate: 71  Rhythm: normal sinus rhythm  QRS Axis: normal  Intervals: normal  ST/T Wave abnormalities: flattening of T waves since ekg 11/12/11 in anterolateral leads  Conduction Disutrbances:none  Narrative Interpretation:   Old EKG Reviewed: changes noted   1. Non-STEMI (non-ST elevated  myocardial infarction)       MDM  Pt seen by Dr. Eldridge Dace while I was in the room.  Pt to go to cath lab tonight.  Possibly occlusion of circumflex branch post bifurcation after stent placement last week.        Vernice Jefferson, MD 11/15/11 [phone]

## 2011-11-15 NOTE — ED Notes (Signed)
Just had cardic stent last thursday

## 2011-11-15 NOTE — H&P (Signed)
Admit date: 11/15/2011 Referring Physician [facility] Primary Cardiologist [facility] Chief complaint/reason for admission:  Increased troponin  HPI: 71 year old man who recently underwent PCI of the circumflex.  He doing well until last night.  He started experiencing some chest discomfort.  He went to bed.  This morning he felt much better.  He did not feel like eating.  He had some nausea.  He had increasing chest discomfort during the day today.  His family made him call his doctor's office who advised him to the emergency room.  Initial ECG was unremarkable.  His  first set of enzymes showed a troponin of 21.  He is continuing to have pain.  It is about 5/10.  No sweating, lightheadedness.    PMH:    Past Medical History  Diagnosis Date  . Heart murmur   . Coronary artery disease   . Hypertension   . Arthritis   . Diabetes mellitus     Insulin dependent  . Dysrhythmia   . H/O hiatal hernia   . Chronic lower back pain   . Chronic leg pain     right  . Angina   . Gout     right wrist; right foot; right elvow; have had it since 1970's    PSH:    Past Surgical History  Procedure Date  . Knee arthroscopy 1990's    left  . Joint replacement 04/03/2002    right hip replacment  . Tonsillectomy ~ 1948  . Cholecystectomy 1990's  . Inguinal hernia repair 2003    right  . Back surgery     "total of 3 times" S/P fall   . Coronary angioplasty with stent placement 09/30/2011    "1 then; makes a total of 4"  . Coronary angioplasty with stent placement 11/11/11    "1; makes a total of 5"  . Coronary angioplasty 11/11/11    ALLERGIES:   Review of patient's allergies indicates no known allergies.  Prior to Admit Meds:   Prescriptions prior to admission  Medication Sig Dispense Refill  . allopurinol (ZYLOPRIM) 300 MG tablet Take 300 mg by mouth daily.       Marland Kitchen aspirin EC 325 MG tablet Take 325 mg by mouth daily.       . clopidogrel (PLAVIX) 75 MG tablet Take 75 mg by mouth daily with  breakfast.      . glimepiride (AMARYL) 4 MG tablet Take 4 mg by mouth 2 (two) times daily.       Marland Kitchen HYDROcodone-acetaminophen (NORCO) 5-325 MG per tablet Take 1-2 tablets by mouth every 6 (six) hours as needed. For pain      . insulin glargine (LANTUS) 100 UNIT/ML injection Inject 30-50 Units into the skin daily. Sliding scale      . metFORMIN (GLUCOPHAGE) 1000 MG tablet Take 1 tablet (1,000 mg total) by mouth 2 (two) times daily.      . nisoldipine (SULAR) 20 MG 24 hr tablet Take 20 mg by mouth every evening.       . Potassium Gluconate 550 MG TABS Take 1 tablet by mouth daily.      Marland Kitchen telmisartan-hydrochlorothiazide (MICARDIS HCT) 80-12.5 MG per tablet Take 1 tablet by mouth daily.       Marland Kitchen terazosin (HYTRIN) 5 MG capsule Take 5 mg by mouth at bedtime.        Family HX:   No family history on file. Social HX:    History   Social History  . Marital Status:  Married    Spouse Name: N/A    Number of Children: N/A  . Years of Education: N/A   Occupational History  . Not on file.   Social History Main Topics  . Smoking status: Former Smoker -- 0.5 packs/day for 3 years    Types: Cigarettes    Quit date: 11/11/1973  . Smokeless tobacco: Current User    Types: Chew   Comment: quit smoking cigarettes 1970's  . Alcohol Use: No  . Drug Use: No  . Sexually Active: No   Other Topics Concern  . Not on file   Social History Narrative  . No narrative on file     ROS:  All 11 ROS were addressed and are negative except what is stated in the HPI  PHYSICAL EXAM Filed Vitals:   11/15/11 1722  BP: 151/77  Pulse: 59  Temp:   Resp: 17   General: Well developed, well nourished, in no acute distress Head:  Normal cephalic and atramatic  Lungs:  Clear bilaterally to auscultation and percussion. Heart:  HRRR S1 S2 Abdomen:  abdomen soft Msk:  Back normal, normal gait. Normal strength and tone for age. Extremities:   No clubbing, cyanosis or edema.  DP +1 Neuro: Alert and oriented X  3. Psych:  Good affect, responds appropriately   Labs:   Lab Results  Component Value Date   WBC 6.9 11/15/2011   HGB 14.0 11/15/2011   HCT 38.7* 11/15/2011   MCV 85.8 11/15/2011   PLT 139* 11/15/2011    Lab 11/15/11 1601  NA 144  K 3.5  CL 104  CO2 27  BUN 17  CREATININE 0.94  CALCIUM 10.6*  PROT 7.9  BILITOT 0.7  ALKPHOS 97  ALT 25  AST 71*  GLUCOSE 111*   Lab Results  Component Value Date   TROPONINI 21.51* 11/15/2011   No results found for this basename: PTT   Lab Results  Component Value Date   INR 1.03 11/15/2011   INR 1.1 07/12/2007     No results found for this basename: CHOL   No results found for this basename: HDL   No results found for this basename: LDLCALC   No results found for this basename: TRIG   No results found for this basename: CHOLHDL   No results found for this basename: LDLDIRECT      Radiology:  @RISRSLT24 @  EKG:  NSR, NSST  ASSESSMENT: Possible acute stent thrombosis  PLAN:  Plan to emergently cath to evaluate coronary anatomy.  Plan will be dependent on that result.  Will need dual antiplatelet therapy.  May need to check P2Y12 testing.  Corky Crafts., MD  11/15/2011  6:04 PM

## 2011-11-16 DIAGNOSIS — E876 Hypokalemia: Secondary | ICD-10-CM | POA: Diagnosis not present

## 2011-11-16 DIAGNOSIS — I2510 Atherosclerotic heart disease of native coronary artery without angina pectoris: Secondary | ICD-10-CM | POA: Diagnosis not present

## 2011-11-16 DIAGNOSIS — F17200 Nicotine dependence, unspecified, uncomplicated: Secondary | ICD-10-CM | POA: Diagnosis not present

## 2011-11-16 DIAGNOSIS — I214 Non-ST elevation (NSTEMI) myocardial infarction: Secondary | ICD-10-CM | POA: Diagnosis not present

## 2011-11-16 DIAGNOSIS — I2129 ST elevation (STEMI) myocardial infarction involving other sites: Secondary | ICD-10-CM | POA: Diagnosis not present

## 2011-11-16 DIAGNOSIS — I201 Angina pectoris with documented spasm: Secondary | ICD-10-CM | POA: Diagnosis not present

## 2011-11-16 LAB — GLUCOSE, CAPILLARY
Glucose-Capillary: 93 mg/dL (ref 70–99)
Glucose-Capillary: 212 mg/dL — ABNORMAL HIGH (ref 70–99)
Glucose-Capillary: 243 mg/dL — ABNORMAL HIGH (ref 70–99)
Glucose-Capillary: 61 mg/dL — ABNORMAL LOW (ref 70–99)
Glucose-Capillary: 76 mg/dL (ref 70–99)

## 2011-11-16 LAB — CBC
HCT: 32.9 % — ABNORMAL LOW (ref 39.0–52.0)
Hemoglobin: 11.8 g/dL — ABNORMAL LOW (ref 13.0–17.0)
MCH: 30.6 pg (ref 26.0–34.0)
MCHC: 35.9 g/dL (ref 30.0–36.0)
MCV: 85.5 fL (ref 78.0–100.0)
Platelets: 111 10*3/uL — ABNORMAL LOW (ref 150–400)
RBC: 3.85 MIL/uL — ABNORMAL LOW (ref 4.22–5.81)
RDW: 14.3 % (ref 11.5–15.5)
WBC: 6.4 10*3/uL (ref 4.0–10.5)

## 2011-11-16 LAB — BASIC METABOLIC PANEL
BUN: 14 mg/dL (ref 6–23)
CO2: 29 mEq/L (ref 19–32)
Calcium: 9.5 mg/dL (ref 8.4–10.5)
Chloride: 105 mEq/L (ref 96–112)
Creatinine, Ser: 0.92 mg/dL (ref 0.50–1.35)
GFR calc Af Amer: >90 mL/min (ref >90)
GFR calc non Af Amer: 83 mL/min — ABNORMAL LOW (ref >90)
Glucose, Bld: 93 mg/dL (ref 70–99)
Potassium: 3.2 mEq/L — ABNORMAL LOW (ref 3.5–5.1)
Sodium: 143 mEq/L (ref 135–145)

## 2011-11-16 LAB — PLATELET INHIBITION P2Y12: Platelet Function  P2Y12: 144 [PRU] — ABNORMAL LOW (ref 194–418)

## 2011-11-16 LAB — CARDIAC PANEL(CRET KIN+CKTOT+MB+TROPI)
CK, MB: 25.7 ng/mL (ref 0.3–4.0)
Relative Index: 9.7 — ABNORMAL HIGH (ref 0.0–2.5)
Total CK: 264 U/L — ABNORMAL HIGH (ref 7–232)
Troponin I: 11.26 ng/mL (ref <0.30)

## 2011-11-16 LAB — MRSA PCR SCREENING: MRSA by PCR: NEGATIVE

## 2011-11-16 MED ORDER — POTASSIUM CHLORIDE CRYS ER 20 MEQ PO TBCR
40.0000 meq | EXTENDED_RELEASE_TABLET | Freq: Two times a day (BID) | ORAL | Status: DC
  Administered 2011-11-16: 40 meq via ORAL
  Filled 2011-11-16 (×2): qty 2

## 2011-11-16 MED ORDER — INSULIN ASPART 100 UNIT/ML ~~LOC~~ SOLN
0.0000 [IU] | Freq: Three times a day (TID) | SUBCUTANEOUS | Status: DC
  Administered 2011-11-16 (×2): 5 [IU] via SUBCUTANEOUS
  Administered 2011-11-17: 3 [IU] via SUBCUTANEOUS
  Filled 2011-11-16: qty 3

## 2011-11-16 MED FILL — Dextrose Inj 5%: INTRAVENOUS | Qty: 50 | Status: AC

## 2011-11-16 NOTE — Progress Notes (Signed)
CRITICAL VALUE ALERT  Critical value received:  CKMB 48.3  Date of notification:  11/15/2011  Time of notification:  2323  Critical value read back:yes  Nurse who received alert:  Madalyn Rob   MD notified (1st page):  Dr. Gwendalyn Ege  Time of first page:  2323  MD notified (2nd page):  Time of second page:  Responding MD:  Dr. Gwendalyn Ege  Time MD responded:  2330

## 2011-11-16 NOTE — Progress Notes (Signed)
SUBJECTIVE:  No further chest pain.  No problems with right wrist.  OBJECTIVE:   Vitals:   Filed Vitals:   11/16/11 0600 11/16/11 0700 11/16/11 0800 11/16/11 0801  BP: 106/45 123/66 113/69 113/69  Pulse: 58 62 53 59  Temp:    98 F (36.7 C)  TempSrc:    Oral  Resp: 17 17 15 17   Height:      Weight:      SpO2: 98% 99% 99% 100%   I&O's:   Intake/Output Summary (Last 24 hours) at 11/16/11 0906 Last data filed at 11/16/11 0417  Gross per 24 hour  Intake    825 ml  Output    650 ml  Net    175 ml   TELEMETRY: Reviewed telemetry pt in NSR:     PHYSICAL EXAM General: Well developed, well nourished, in no acute distress Head: Marland Kitchen   Normal cephalic and atramatic  Lungs:  No wheezing Heart:   HRRR              Abdomen: abdomen soft and non-tender              Msk:  Back normal, normal gait.  Extremities:   2+ right radial pulse Neuro: Alert and oriented X 3. Psych:  Good affect, responds appropriately   LABS: Basic Metabolic Panel:  Basename 11/16/11 0555 11/15/11 1601  NA 143 144  K 3.2* 3.5  CL 105 104  CO2 29 27  GLUCOSE 93 111*  BUN 14 17  CREATININE 0.92 0.94  CALCIUM 9.5 10.6*  MG -- --  PHOS -- --   Liver Function Tests:  Basename 11/15/11 1601  AST 71*  ALT 25  ALKPHOS 97  BILITOT 0.7  PROT 7.9  ALBUMIN 4.4   No results found for this basename: LIPASE:2,AMYLASE:2 in the last 72 hours CBC:  Basename 11/16/11 0555 11/15/11 1601  WBC 6.4 6.9  NEUTROABS -- 4.9  HGB 11.8* 14.0  HCT 32.9* 38.7*  MCV 85.5 85.8  PLT 111* 139*   Cardiac Enzymes:  Basename 11/16/11 0555 11/15/11 2221 11/15/11 1602  CKTOTAL 264* 436* --  CKMB 25.7* 48.3* --  CKMBINDEX -- -- --  TROPONINI 11.26* 20.69* 21.51*   BNP: No components found with this basename: POCBNP:3 D-Dimer: No results found for this basename: DDIMER:2 in the last 72 hours Hemoglobin A1C: No results found for this basename: HGBA1C in the last 72 hours Fasting Lipid Panel: No results found for  this basename: CHOL,HDL,LDLCALC,TRIG,CHOLHDL,LDLDIRECT in the last 72 hours Thyroid Function Tests: No results found for this basename: TSH,T4TOTAL,FREET3,T3FREE,THYROIDAB in the last 72 hours Anemia Panel: No results found for this basename: VITAMINB12,FOLATE,FERRITIN,TIBC,IRON,RETICCTPCT in the last 72 hours Coag Panel:   Lab Results  Component Value Date   INR 1.03 11/15/2011   INR 1.1 07/12/2007    RADIOLOGY: Dg Chest 2 View  11/15/2011  *RADIOLOGY REPORT*  Clinical Data: Chest pain.  Coronary artery disease.  CHEST - 2 VIEW  Comparison: 07/13/2007  Findings: Heart size and vascularity are normal and the lungs are clear.  Coronary artery stent visible.  No effusions.  No osseous abnormalities.  IMPRESSION: No acute disease.  Original Report Authenticated By: Gwynn Burly, M.D.      ASSESSMENT: NSTEMI, hypokalemia  PLAN:  Watch on tele. COntinue aggressive secondary prevention.  Small infarct from branch vessel closure.  Treat pain with analgesics.  Vessel is too small to stent.    Tobacco use cessation.  Replace potassium.  VARANASI,JAYADEEP S.,  MD  11/16/2011  9:06 AM

## 2011-11-16 NOTE — Progress Notes (Signed)
CARDIAC REHAB PHASE I   PRE:  Rate/Rhythm: 60 SR    BP: sitting 111/61    SaO2: 98 RA  MODE:  Ambulation: 350 ft   POST:  Rate/Rhythm: 73 SR    BP: sitting 108/57     SaO2:   Tolerated well. Just saw pt last week after stent. Discussed MI and restrictions now. Will need to wait on CRPII until after he sees Dr. Eldridge Dace. Pt had not began walking instructions that I gave him last week.  [phone]  Harriet Masson CES, ACSM

## 2011-11-16 NOTE — Progress Notes (Signed)
UR Completed. Simmons, Avelyn Touch F [phone]  

## 2011-11-16 NOTE — ED Provider Notes (Signed)
I reviewed EKG, but was unable to see patient prior to going to cath lab with dr Eldridge Dace, dr Eldridge Dace took him to the cath lab  Joya Gaskins, MD 11/16/11 2014

## 2011-11-17 DIAGNOSIS — E876 Hypokalemia: Secondary | ICD-10-CM | POA: Diagnosis not present

## 2011-11-17 DIAGNOSIS — I2129 ST elevation (STEMI) myocardial infarction involving other sites: Secondary | ICD-10-CM | POA: Diagnosis not present

## 2011-11-17 DIAGNOSIS — I201 Angina pectoris with documented spasm: Secondary | ICD-10-CM | POA: Diagnosis not present

## 2011-11-17 DIAGNOSIS — I2510 Atherosclerotic heart disease of native coronary artery without angina pectoris: Secondary | ICD-10-CM | POA: Diagnosis not present

## 2011-11-17 DIAGNOSIS — F17200 Nicotine dependence, unspecified, uncomplicated: Secondary | ICD-10-CM | POA: Diagnosis not present

## 2011-11-17 LAB — BASIC METABOLIC PANEL
BUN: 21 mg/dL (ref 6–23)
CO2: 31 mEq/L (ref 19–32)
Calcium: 9.6 mg/dL (ref 8.4–10.5)
Chloride: 102 mEq/L (ref 96–112)
Creatinine, Ser: 1.09 mg/dL (ref 0.50–1.35)
GFR calc Af Amer: 77 mL/min — ABNORMAL LOW (ref >90)
GFR calc non Af Amer: 66 mL/min — ABNORMAL LOW (ref >90)
Glucose, Bld: 195 mg/dL — ABNORMAL HIGH (ref 70–99)
Potassium: 3.3 mEq/L — ABNORMAL LOW (ref 3.5–5.1)
Sodium: 140 mEq/L (ref 135–145)

## 2011-11-17 LAB — CARDIAC PANEL(CRET KIN+CKTOT+MB+TROPI)
CK, MB: 7.7 ng/mL (ref 0.3–4.0)
Relative Index: INVALID (ref 0.0–2.5)
Total CK: 93 U/L (ref 7–232)
Troponin I: 3.84 ng/mL (ref <0.30)

## 2011-11-17 LAB — GLUCOSE, CAPILLARY: Glucose-Capillary: 162 mg/dL — ABNORMAL HIGH (ref 70–99)

## 2011-11-17 MED ORDER — POTASSIUM CHLORIDE CRYS ER 20 MEQ PO TBCR: 20.0000 meq | EXTENDED_RELEASE_TABLET | Freq: Every day | ORAL | Status: DC

## 2011-11-17 NOTE — Progress Notes (Signed)
CBG:61 Treatment: 15 GM carbohydrate snack4oz orange juice with protein snack.  Symptoms: None none   Follow-up CBG: Time:2224 CBG Result:76  Possible Reasons for Event: Unknown  unknown  Comments/MD notified no    [place], [address]

## 2011-11-17 NOTE — Discharge Summary (Addendum)
Patient ID: TAVONE CAESAR MRN: [id number] DOB/AGE: [birth year] 71 y.o.  Admit date: 11/15/2011 Discharge date: 11/17/2011  Primary Discharge Diagnosis  Lateral wall MI Secondary Discharge Diagnosis tobacco abuse, HTN, CAD  Significant Diagnostic Studies: angiography: cardiac cath with PTCA to distal circ and DES to mid OM2  Consults: None  Hospital Course: Patient was admitted with chest pain and positive enzymes.  He had a cath showing that his recently placed stents were widely patent.  He had a distal circumflex occlusion well past the prior stent.  This vessel was too small to stent so a PTCA was done.  His pain was relieved.  THere was a mid OM 2 lesion that was stented as well.  He tolerated the procedure well.  His enzymes declined.  He did not have any further chest pain with walking or at rest.  His telemetry was stable.  He was bradycardic at times so no beta blocker was used.  He was counseled to stop using chewing tobacco.  Potassium was supplemented.  Will change home potassium to better supplement.  Discharge Exam: Blood pressure 122/73, pulse 60, temperature 97.7 F (36.5 C), temperature source Oral, resp. rate 18, height 5\' 9"  (1.753 m), weight 94.4 kg (208 lb 1.8 oz), SpO2 95.00%.   [place]/AT RRR, S1S2 CTA bilaterally Obese 2+ right radial pulse Labs:   Lab Results  Component Value Date   WBC 6.4 11/16/2011   HGB 11.8* 11/16/2011   HCT 32.9* 11/16/2011   MCV 85.5 11/16/2011   PLT 111* 11/16/2011    Lab 11/17/11 0541 11/15/11 1601  NA 140 --  K 3.3* --  CL 102 --  CO2 31 --  BUN 21 --  CREATININE 1.09 --  CALCIUM 9.6 --  PROT -- 7.9  BILITOT -- 0.7  ALKPHOS -- 97  ALT -- 25  AST -- 71*  GLUCOSE 195* --   Lab Results  Component Value Date   CKTOTAL 93 11/17/2011   CKMB 7.7* 11/17/2011   TROPONINI 3.84* 11/17/2011    No results found for this basename: CHOL   No results found for this basename: HDL   No results found for this basename: LDLCALC   No  results found for this basename: TRIG   No results found for this basename: CHOLHDL   No results found for this basename: LDLDIRECT       EKG:NSR, inf Q waves, no ST segment changes  FOLLOW UP PLANS AND APPOINTMENTS  Medication List  As of 11/17/2011  8:31 AM   STOP taking these medications         Potassium Gluconate 550 MG Tabs         TAKE these medications         allopurinol 300 MG tablet   Commonly known as: ZYLOPRIM   Take 300 mg by mouth daily.      aspirin EC 325 MG tablet   Take 325 mg by mouth daily.      clopidogrel 75 MG tablet   Commonly known as: PLAVIX   Take 75 mg by mouth daily with breakfast.      glimepiride 4 MG tablet   Commonly known as: AMARYL   Take 4 mg by mouth 2 (two) times daily.      HYDROcodone-acetaminophen 5-325 MG per tablet   Commonly known as: NORCO   Take 1-2 tablets by mouth every 6 (six) hours as needed. For pain      insulin glargine 100 UNIT/ML injection  Commonly known as: LANTUS   Inject 30-50 Units into the skin daily. Sliding scale      metFORMIN 1000 MG tablet   Commonly known as: GLUCOPHAGE   Take 1 tablet (1,000 mg total) by mouth 2 (two) times daily.      nisoldipine 20 MG 24 hr tablet   Commonly known as: SULAR   Take 20 mg by mouth every evening.      potassium chloride SA 20 MEQ tablet   Commonly known as: K-DUR,KLOR-CON   Take 1 tablet (20 mEq total) by mouth daily.      telmisartan-hydrochlorothiazide 80-12.5 MG per tablet   Commonly known as: MICARDIS HCT   Take 1 tablet by mouth daily.      terazosin 5 MG capsule   Commonly known as: HYTRIN   Take 5 mg by mouth at bedtime.           Follow-up Information    Follow up with Corky Crafts., MD. (as scheduled)    Contact information:   [address] [place] [address] [phone]          BRING ALL MEDICATIONS WITH YOU TO FOLLOW UP APPOINTMENTS  Time spent with patient to include physician  time:20 Signed: Aribella Vavra S. 11/17/2011, 8:31 AM

## 2011-11-17 NOTE — Discharge Instructions (Signed)
Do not bend wrist for 3 days.  No lifting > 10 lbs for 3 days.

## 2011-11-17 NOTE — Progress Notes (Signed)
Pt. Discharged 11/17/2011  10:05 AM Discharge instructions reviewed with patient/family. Patient/family verbalized understanding. All Rx's given. Questions answered as needed. Pt. Discharged to home with family/self. Refused wheelchair or assistance to entrance of hospital. [address]

## 2011-11-26 DIAGNOSIS — I2510 Atherosclerotic heart disease of native coronary artery without angina pectoris: Secondary | ICD-10-CM | POA: Diagnosis not present

## 2011-12-24 DIAGNOSIS — M545 Low back pain, unspecified: Secondary | ICD-10-CM | POA: Diagnosis not present

## 2011-12-24 DIAGNOSIS — M169 Osteoarthritis of hip, unspecified: Secondary | ICD-10-CM | POA: Diagnosis not present

## 2011-12-24 DIAGNOSIS — M1610 Unilateral primary osteoarthritis, unspecified hip: Secondary | ICD-10-CM | POA: Diagnosis not present

## 2011-12-24 DIAGNOSIS — M5126 Other intervertebral disc displacement, lumbar region: Secondary | ICD-10-CM | POA: Diagnosis not present

## 2011-12-28 ENCOUNTER — Other Ambulatory Visit: Payer: Self-pay | Admitting: Orthopaedic Surgery

## 2011-12-28 DIAGNOSIS — M545 Low back pain, unspecified: Secondary | ICD-10-CM

## 2011-12-28 DIAGNOSIS — R531 Weakness: Secondary | ICD-10-CM

## 2011-12-29 ENCOUNTER — Ambulatory Visit
Admission: RE | Admit: 2011-12-29 | Discharge: 2011-12-29 | Disposition: A | Discharge status: Home / Self Care | Payer: Medicare Other | Source: Ambulatory Visit | Attending: Orthopaedic Surgery | Admitting: Orthopaedic Surgery

## 2011-12-29 DIAGNOSIS — R531 Weakness: Secondary | ICD-10-CM

## 2011-12-29 DIAGNOSIS — Z981 Arthrodesis status: Secondary | ICD-10-CM | POA: Diagnosis not present

## 2011-12-29 DIAGNOSIS — M5126 Other intervertebral disc displacement, lumbar region: Secondary | ICD-10-CM | POA: Diagnosis not present

## 2011-12-29 DIAGNOSIS — M545 Low back pain, unspecified: Secondary | ICD-10-CM

## 2011-12-29 DIAGNOSIS — M47817 Spondylosis without myelopathy or radiculopathy, lumbosacral region: Secondary | ICD-10-CM | POA: Diagnosis not present

## 2011-12-29 MED ORDER — GADOBENATE DIMEGLUMINE 529 MG/ML IV SOLN
20.0000 mL | Freq: Once | INTRAVENOUS | Status: AC | PRN
  Administered 2011-12-29: 20 mL via INTRAVENOUS

## 2011-12-30 DIAGNOSIS — M1610 Unilateral primary osteoarthritis, unspecified hip: Secondary | ICD-10-CM | POA: Diagnosis not present

## 2011-12-30 DIAGNOSIS — M5126 Other intervertebral disc displacement, lumbar region: Secondary | ICD-10-CM | POA: Diagnosis not present

## 2011-12-30 DIAGNOSIS — M169 Osteoarthritis of hip, unspecified: Secondary | ICD-10-CM | POA: Diagnosis not present

## 2012-01-05 ENCOUNTER — Encounter (HOSPITAL_COMMUNITY): Payer: Self-pay | Admitting: *Deleted

## 2012-01-05 ENCOUNTER — Ambulatory Visit: Payer: Medicare Other | Admitting: Physical Therapy

## 2012-01-17 DIAGNOSIS — L570 Actinic keratosis: Secondary | ICD-10-CM | POA: Diagnosis not present

## 2012-02-14 DIAGNOSIS — M545 Low back pain, unspecified: Secondary | ICD-10-CM | POA: Diagnosis not present

## 2012-02-14 DIAGNOSIS — M25569 Pain in unspecified knee: Secondary | ICD-10-CM | POA: Diagnosis not present

## 2012-02-14 DIAGNOSIS — M25559 Pain in unspecified hip: Secondary | ICD-10-CM | POA: Diagnosis not present

## 2012-02-15 ENCOUNTER — Other Ambulatory Visit (HOSPITAL_COMMUNITY): Payer: Self-pay | Admitting: Orthopaedic Surgery

## 2012-02-15 DIAGNOSIS — Z96649 Presence of unspecified artificial hip joint: Secondary | ICD-10-CM

## 2012-02-15 DIAGNOSIS — T84038A Mechanical loosening of other internal prosthetic joint, initial encounter: Secondary | ICD-10-CM

## 2012-02-15 DIAGNOSIS — M25551 Pain in right hip: Secondary | ICD-10-CM

## 2012-02-21 ENCOUNTER — Encounter (HOSPITAL_COMMUNITY)
Admission: RE | Admit: 2012-02-21 | Discharge: 2012-02-21 | Disposition: A | Discharge status: Home / Self Care | Payer: Medicare Other | Source: Ambulatory Visit | Attending: Orthopaedic Surgery | Admitting: Orthopaedic Surgery

## 2012-02-21 ENCOUNTER — Encounter (HOSPITAL_COMMUNITY): Payer: Self-pay

## 2012-02-21 ENCOUNTER — Ambulatory Visit (HOSPITAL_COMMUNITY)
Admission: RE | Admit: 2012-02-21 | Discharge: 2012-02-21 | Disposition: A | Discharge status: Home / Self Care | Payer: Medicare Other | Source: Ambulatory Visit | Attending: Orthopaedic Surgery | Admitting: Orthopaedic Surgery

## 2012-02-21 DIAGNOSIS — M25559 Pain in unspecified hip: Secondary | ICD-10-CM | POA: Diagnosis not present

## 2012-02-21 DIAGNOSIS — T84039A Mechanical loosening of unspecified internal prosthetic joint, initial encounter: Secondary | ICD-10-CM | POA: Insufficient documentation

## 2012-02-21 DIAGNOSIS — Z96649 Presence of unspecified artificial hip joint: Secondary | ICD-10-CM | POA: Insufficient documentation

## 2012-02-21 DIAGNOSIS — M25551 Pain in right hip: Secondary | ICD-10-CM

## 2012-02-21 DIAGNOSIS — T84038A Mechanical loosening of other internal prosthetic joint, initial encounter: Secondary | ICD-10-CM

## 2012-02-21 MED ORDER — TECHNETIUM TC 99M MEDRONATE IV KIT
26.0000 | PACK | Freq: Once | INTRAVENOUS | Status: AC | PRN
  Administered 2012-02-21: 26 via INTRAVENOUS

## 2012-02-24 DIAGNOSIS — M25559 Pain in unspecified hip: Secondary | ICD-10-CM | POA: Diagnosis not present

## 2012-02-24 DIAGNOSIS — M169 Osteoarthritis of hip, unspecified: Secondary | ICD-10-CM | POA: Diagnosis not present

## 2012-02-24 DIAGNOSIS — M1610 Unilateral primary osteoarthritis, unspecified hip: Secondary | ICD-10-CM | POA: Diagnosis not present

## 2012-03-23 DIAGNOSIS — M1610 Unilateral primary osteoarthritis, unspecified hip: Secondary | ICD-10-CM | POA: Diagnosis not present

## 2012-03-23 DIAGNOSIS — M25559 Pain in unspecified hip: Secondary | ICD-10-CM | POA: Diagnosis not present

## 2012-03-23 DIAGNOSIS — M169 Osteoarthritis of hip, unspecified: Secondary | ICD-10-CM | POA: Diagnosis not present

## 2012-03-28 DIAGNOSIS — I10 Essential (primary) hypertension: Secondary | ICD-10-CM | POA: Diagnosis not present

## 2012-03-28 DIAGNOSIS — I2510 Atherosclerotic heart disease of native coronary artery without angina pectoris: Secondary | ICD-10-CM | POA: Diagnosis not present

## 2012-03-28 DIAGNOSIS — E785 Hyperlipidemia, unspecified: Secondary | ICD-10-CM | POA: Diagnosis not present

## 2012-04-18 DIAGNOSIS — I2510 Atherosclerotic heart disease of native coronary artery without angina pectoris: Secondary | ICD-10-CM | POA: Diagnosis not present

## 2012-04-18 DIAGNOSIS — I10 Essential (primary) hypertension: Secondary | ICD-10-CM | POA: Diagnosis not present

## 2012-04-18 DIAGNOSIS — IMO0001 Reserved for inherently not codable concepts without codable children: Secondary | ICD-10-CM | POA: Diagnosis not present

## 2012-04-18 DIAGNOSIS — H8110 Benign paroxysmal vertigo, unspecified ear: Secondary | ICD-10-CM | POA: Diagnosis not present

## 2012-04-18 DIAGNOSIS — E7800 Pure hypercholesterolemia, unspecified: Secondary | ICD-10-CM | POA: Diagnosis not present

## 2012-04-18 DIAGNOSIS — M109 Gout, unspecified: Secondary | ICD-10-CM | POA: Diagnosis not present

## 2012-10-10 DIAGNOSIS — Z23 Encounter for immunization: Secondary | ICD-10-CM | POA: Diagnosis not present

## 2012-10-10 DIAGNOSIS — E785 Hyperlipidemia, unspecified: Secondary | ICD-10-CM | POA: Diagnosis not present

## 2012-10-10 DIAGNOSIS — R079 Chest pain, unspecified: Secondary | ICD-10-CM | POA: Diagnosis not present

## 2012-10-10 DIAGNOSIS — I10 Essential (primary) hypertension: Secondary | ICD-10-CM | POA: Diagnosis not present

## 2012-10-10 DIAGNOSIS — I2510 Atherosclerotic heart disease of native coronary artery without angina pectoris: Secondary | ICD-10-CM | POA: Diagnosis not present

## 2012-11-30 DIAGNOSIS — I2510 Atherosclerotic heart disease of native coronary artery without angina pectoris: Secondary | ICD-10-CM | POA: Diagnosis not present

## 2012-11-30 DIAGNOSIS — E559 Vitamin D deficiency, unspecified: Secondary | ICD-10-CM | POA: Diagnosis not present

## 2012-11-30 DIAGNOSIS — Z1331 Encounter for screening for depression: Secondary | ICD-10-CM | POA: Diagnosis not present

## 2012-11-30 DIAGNOSIS — Z0000 Encounter for general adult medical examination without abnormal findings: Secondary | ICD-10-CM | POA: Diagnosis not present

## 2012-11-30 DIAGNOSIS — IMO0001 Reserved for inherently not codable concepts without codable children: Secondary | ICD-10-CM | POA: Diagnosis not present

## 2012-11-30 DIAGNOSIS — N400 Enlarged prostate without lower urinary tract symptoms: Secondary | ICD-10-CM | POA: Diagnosis not present

## 2012-11-30 DIAGNOSIS — E1149 Type 2 diabetes mellitus with other diabetic neurological complication: Secondary | ICD-10-CM | POA: Diagnosis not present

## 2012-11-30 DIAGNOSIS — E785 Hyperlipidemia, unspecified: Secondary | ICD-10-CM | POA: Diagnosis not present

## 2012-11-30 DIAGNOSIS — H8110 Benign paroxysmal vertigo, unspecified ear: Secondary | ICD-10-CM | POA: Diagnosis not present

## 2012-11-30 DIAGNOSIS — Z79899 Other long term (current) drug therapy: Secondary | ICD-10-CM | POA: Diagnosis not present

## 2013-01-04 DIAGNOSIS — M545 Low back pain, unspecified: Secondary | ICD-10-CM | POA: Diagnosis not present

## 2013-01-04 DIAGNOSIS — M961 Postlaminectomy syndrome, not elsewhere classified: Secondary | ICD-10-CM | POA: Diagnosis not present

## 2013-01-04 DIAGNOSIS — M47817 Spondylosis without myelopathy or radiculopathy, lumbosacral region: Secondary | ICD-10-CM | POA: Diagnosis not present

## 2013-01-18 DIAGNOSIS — L570 Actinic keratosis: Secondary | ICD-10-CM | POA: Diagnosis not present

## 2013-01-18 DIAGNOSIS — D235 Other benign neoplasm of skin of trunk: Secondary | ICD-10-CM | POA: Diagnosis not present

## 2013-02-19 DIAGNOSIS — E109 Type 1 diabetes mellitus without complications: Secondary | ICD-10-CM | POA: Diagnosis not present

## 2013-02-19 DIAGNOSIS — H2510 Age-related nuclear cataract, unspecified eye: Secondary | ICD-10-CM | POA: Diagnosis not present

## 2013-03-07 DIAGNOSIS — H2510 Age-related nuclear cataract, unspecified eye: Secondary | ICD-10-CM | POA: Diagnosis not present

## 2013-03-07 DIAGNOSIS — H21569 Pupillary abnormality, unspecified eye: Secondary | ICD-10-CM | POA: Diagnosis not present

## 2013-03-14 DIAGNOSIS — H2510 Age-related nuclear cataract, unspecified eye: Secondary | ICD-10-CM | POA: Diagnosis not present

## 2013-03-14 DIAGNOSIS — H21569 Pupillary abnormality, unspecified eye: Secondary | ICD-10-CM | POA: Diagnosis not present

## 2013-04-02 DIAGNOSIS — E785 Hyperlipidemia, unspecified: Secondary | ICD-10-CM | POA: Diagnosis not present

## 2013-04-02 DIAGNOSIS — Z01810 Encounter for preprocedural cardiovascular examination: Secondary | ICD-10-CM | POA: Diagnosis not present

## 2013-04-02 DIAGNOSIS — I2510 Atherosclerotic heart disease of native coronary artery without angina pectoris: Secondary | ICD-10-CM | POA: Diagnosis not present

## 2013-04-02 DIAGNOSIS — I10 Essential (primary) hypertension: Secondary | ICD-10-CM | POA: Diagnosis not present

## 2013-04-11 DIAGNOSIS — M961 Postlaminectomy syndrome, not elsewhere classified: Secondary | ICD-10-CM | POA: Diagnosis not present

## 2013-04-11 DIAGNOSIS — M1610 Unilateral primary osteoarthritis, unspecified hip: Secondary | ICD-10-CM | POA: Diagnosis not present

## 2013-04-11 DIAGNOSIS — M545 Low back pain, unspecified: Secondary | ICD-10-CM | POA: Diagnosis not present

## 2013-04-11 DIAGNOSIS — M169 Osteoarthritis of hip, unspecified: Secondary | ICD-10-CM | POA: Diagnosis not present

## 2013-04-11 DIAGNOSIS — M47817 Spondylosis without myelopathy or radiculopathy, lumbosacral region: Secondary | ICD-10-CM | POA: Diagnosis not present

## 2013-05-28 DIAGNOSIS — I2510 Atherosclerotic heart disease of native coronary artery without angina pectoris: Secondary | ICD-10-CM | POA: Diagnosis not present

## 2013-05-28 DIAGNOSIS — I10 Essential (primary) hypertension: Secondary | ICD-10-CM | POA: Diagnosis not present

## 2013-05-28 DIAGNOSIS — E785 Hyperlipidemia, unspecified: Secondary | ICD-10-CM | POA: Diagnosis not present

## 2013-05-28 DIAGNOSIS — E1149 Type 2 diabetes mellitus with other diabetic neurological complication: Secondary | ICD-10-CM | POA: Diagnosis not present

## 2013-05-28 DIAGNOSIS — Z79899 Other long term (current) drug therapy: Secondary | ICD-10-CM | POA: Diagnosis not present

## 2013-05-28 DIAGNOSIS — E559 Vitamin D deficiency, unspecified: Secondary | ICD-10-CM | POA: Diagnosis not present

## 2013-05-28 DIAGNOSIS — IMO0001 Reserved for inherently not codable concepts without codable children: Secondary | ICD-10-CM | POA: Diagnosis not present

## 2013-05-28 DIAGNOSIS — N400 Enlarged prostate without lower urinary tract symptoms: Secondary | ICD-10-CM | POA: Diagnosis not present

## 2013-07-11 DIAGNOSIS — M169 Osteoarthritis of hip, unspecified: Secondary | ICD-10-CM | POA: Diagnosis not present

## 2013-07-11 DIAGNOSIS — M47817 Spondylosis without myelopathy or radiculopathy, lumbosacral region: Secondary | ICD-10-CM | POA: Diagnosis not present

## 2013-07-11 DIAGNOSIS — M25569 Pain in unspecified knee: Secondary | ICD-10-CM | POA: Diagnosis not present

## 2013-07-11 DIAGNOSIS — M1610 Unilateral primary osteoarthritis, unspecified hip: Secondary | ICD-10-CM | POA: Diagnosis not present

## 2013-07-18 ENCOUNTER — Other Ambulatory Visit: Payer: Self-pay | Admitting: Interventional Cardiology

## 2013-07-30 DIAGNOSIS — Z961 Presence of intraocular lens: Secondary | ICD-10-CM | POA: Diagnosis not present

## 2013-07-30 DIAGNOSIS — Z23 Encounter for immunization: Secondary | ICD-10-CM | POA: Diagnosis not present

## 2013-10-01 ENCOUNTER — Encounter: Payer: Self-pay | Admitting: Cardiology

## 2013-10-01 DIAGNOSIS — IMO0001 Reserved for inherently not codable concepts without codable children: Secondary | ICD-10-CM

## 2013-10-01 DIAGNOSIS — E785 Hyperlipidemia, unspecified: Secondary | ICD-10-CM | POA: Insufficient documentation

## 2013-10-01 DIAGNOSIS — I2510 Atherosclerotic heart disease of native coronary artery without angina pectoris: Secondary | ICD-10-CM | POA: Insufficient documentation

## 2013-10-01 DIAGNOSIS — I10 Essential (primary) hypertension: Secondary | ICD-10-CM | POA: Insufficient documentation

## 2013-10-01 DIAGNOSIS — N400 Enlarged prostate without lower urinary tract symptoms: Secondary | ICD-10-CM | POA: Insufficient documentation

## 2013-10-01 DIAGNOSIS — E119 Type 2 diabetes mellitus without complications: Secondary | ICD-10-CM | POA: Insufficient documentation

## 2013-10-01 DIAGNOSIS — E118 Type 2 diabetes mellitus with unspecified complications: Secondary | ICD-10-CM | POA: Insufficient documentation

## 2013-10-01 DIAGNOSIS — K219 Gastro-esophageal reflux disease without esophagitis: Secondary | ICD-10-CM | POA: Insufficient documentation

## 2013-10-09 ENCOUNTER — Ambulatory Visit: Payer: Medicare Other | Admitting: Interventional Cardiology

## 2013-10-09 DIAGNOSIS — M169 Osteoarthritis of hip, unspecified: Secondary | ICD-10-CM | POA: Diagnosis not present

## 2013-10-09 DIAGNOSIS — M25569 Pain in unspecified knee: Secondary | ICD-10-CM | POA: Diagnosis not present

## 2013-10-09 DIAGNOSIS — M1610 Unilateral primary osteoarthritis, unspecified hip: Secondary | ICD-10-CM | POA: Diagnosis not present

## 2013-11-07 ENCOUNTER — Encounter: Payer: Self-pay | Admitting: Interventional Cardiology

## 2013-11-07 ENCOUNTER — Ambulatory Visit (INDEPENDENT_AMBULATORY_CARE_PROVIDER_SITE_OTHER): Payer: Medicare Other | Admitting: Interventional Cardiology

## 2013-11-07 VITALS — BP 163/85 | HR 59 | Ht 69.0 in | Wt 203.0 lb

## 2013-11-07 DIAGNOSIS — E785 Hyperlipidemia, unspecified: Secondary | ICD-10-CM

## 2013-11-07 DIAGNOSIS — I10 Essential (primary) hypertension: Secondary | ICD-10-CM

## 2013-11-07 DIAGNOSIS — I2510 Atherosclerotic heart disease of native coronary artery without angina pectoris: Secondary | ICD-10-CM | POA: Diagnosis not present

## 2013-11-07 MED ORDER — TELMISARTAN-HCTZ 80-12.5 MG PO TABS: ORAL_TABLET | ORAL | Status: DC

## 2013-11-07 NOTE — Patient Instructions (Signed)
Your physician has recommended you make the following change in your medication:   1. Increase Micardis/Hct 80-12.5 mg to 2 tablets daily. Call me in about 1 week to let me know how you are doing on the increase and if you are doing okay I will send you in the 160-25 mg tablet.  Your physician wants you to follow-up in: 6 months with Dr. Irish Lack. You will receive a reminder letter in the mail two months in advance. If you don't receive a letter, please call our office to schedule the follow-up appointment.  Make you sure have your labs drawn at Dr. Inda Merlin office at the end of this month. Please tell them to fax labs to [place] at [phone].

## 2013-11-07 NOTE — Progress Notes (Signed)
Patient ID: Keith Espinoza, male   DOB: 1941-02-13, 73 y.o.   MRN: [id number]    [address], [place], [place]  [address] Phone: [phone] Fax:  [phone]  Date:  11/07/2013   ID:  Keith Espinoza, DOB 02-Sep-1941, MRN [id number]  PCP:  No primary provider on file.      History of Present Illness: Keith Espinoza is a 73 y.o. male who has had CAD. He has checked his BP. Higher when he eats salt. At other times it is in the 120s. CAD/ASCVD:  Denies : Chest pain.  Dizziness.  Dyspnea on exertion.  Fatigue.  Leg edema.  Nitroglycerin.  Palpitations.  Syncope.    Wt Readings from Last 3 Encounters:  11/07/13 203 lb (92.08 kg)  11/17/11 208 lb 1.8 oz (94.4 kg)  11/17/11 208 lb 1.8 oz (94.4 kg)     Past Medical History  Diagnosis Date  . Heart murmur   . Coronary artery disease   . Arthritis   . Diabetes mellitus     Insulin dependent  . Dysrhythmia   . H/O hiatal hernia   . Chronic lower back pain   . Chronic leg pain     right  . Angina   . Gout     right wrist; right foot; right elvow; have had it since 1970's  . History of total hip replacement   . Hypertension     Diagnosed 1995   . Vitamin B 12 deficiency     orally replaced  . Diverticulosis   . History of echocardiogram     aortic sclerosis per echo 12/09 EF 65%, otherwise normal  . Allergic rhinitis   . Diabetic neuropathy     MILD  . Gilbert syndrome   . History of hemorrhoids     BLEEDING  . Allergic rhinitis     Current Outpatient Prescriptions  Medication Sig Dispense Refill  . acetaminophen (TYLENOL) 325 MG tablet Take 650 mg by mouth 4 (four) times daily.      Marland Kitchen allopurinol (ZYLOPRIM) 300 MG tablet Take 300 mg by mouth daily.       Marland Kitchen amLODipine (NORVASC) 5 MG tablet Take 5 mg by mouth daily.      Marland Kitchen aspirin EC 325 MG tablet Take 325 mg by mouth daily.       . clopidogrel (PLAVIX) 75 MG tablet TAKE ONE TABLET BY MOUTH ONE TIME DAILY  30 tablet  5  . glimepiride (AMARYL) 4 MG  tablet Take 4 mg by mouth 2 (two) times daily.       Marland Kitchen HYDROcodone-acetaminophen (NORCO) 5-325 MG per tablet Take 1-2 tablets by mouth every 6 (six) hours as needed. For pain      . insulin glargine (LANTUS) 100 UNIT/ML injection Inject 30-50 Units into the skin daily. Sliding scale      . metFORMIN (GLUCOPHAGE) 1000 MG tablet Take 1 tablet (1,000 mg total) by mouth 2 (two) times daily.      . nisoldipine (SULAR) 20 MG 24 hr tablet Take 20 mg by mouth every evening.       . nitroGLYCERIN (NITROSTAT) 0.4 MG SL tablet Place 0.4 mg under the tongue every 5 (five) minutes as needed for chest pain.      . potassium chloride SA (K-DUR,KLOR-CON) 20 MEQ tablet Take 1 tablet (20 mEq total) by mouth daily.  30 tablet  11  . rosuvastatin (CRESTOR) 10 MG tablet Take 10 mg by mouth.  1 TABLET MON AND FRI      . telmisartan-hydrochlorothiazide (MICARDIS HCT) 80-12.5 MG per tablet Take 1 tablet by mouth daily.       Marland Kitchen terazosin (HYTRIN) 5 MG capsule Take 5 mg by mouth at bedtime.        No current facility-administered medications for this visit.    Allergies:    Allergies  Allergen Reactions  . Fish Oil Nausea Only  . Simvastatin     SEVERE MYALGIAS   . Zetia [Ezetimibe]     MYALGIAS    Social History:  The patient  reports that he quit smoking about 40 years ago. His smoking use included Cigarettes. He has a 1.5 pack-year smoking history. His smokeless tobacco use includes Chew. He reports that he does not drink alcohol or use illicit drugs.   Family History:  The patient's family history is not on file.   ROS:  Please see the history of present illness.  No nausea, vomiting.  No fevers, chills.  No focal weakness.  No dysuria. t All other systems reviewed and negative.   PHYSICAL EXAM: VS:  BP 163/85  Pulse 59  Ht 5\' 9"  (1.753 m)  Wt 203 lb (92.08 kg)  BMI 29.96 kg/m2 Well nourished, well developed, in no acute distress HEENT: normal Neck: no JVD, no carotid bruits Cardiac:  normal S1, S2;  RRR; * Lungs:  clear to auscultation bilaterally, no wheezing, rhonchi or rales Abd: soft, nontender, no hepatomegaly Ext: no edema Skin: warm and dry Neuro:   no focal abnormalities noted  EKG:  NSR, NSST changes    ASSESSMENT AND PLAN:  CAD in native artery  Continue Plavix Tablet, 75 MG, 1 tablet, Orally, Once a day Continue Nitroglycerin 0.4 mg tablet, 0.4 mg, 1 tablet as directed, SL, as directed prn chest pain, 25, Refills 6 Continue Aspirin Tablet, 325 MG, 1/2 tablet, Orally, Once a day Notes: No angina. Easy bruising improved on less aspirin. Could decrease to aspirin 81 mg daily. Continue aggressive secondary prevention.    2. Hyperlipidemia  Continue Crestor Tablet, 10 MG, 1 tablet, Orally, M and F / wk Notes: LDL 90 in 11/2012. Due for lipid check at the end of the month.   3. Essential hypertension, benign  Increase Micardis HCT Tablet, 80-12.5 MG, 2 tablet, Orally, Once a day Continue Amlodipine Besylate Tablet, 5 MG, 1 tablet, Orally, Once a day, 30 day(s), 30, Refills 11 Notes: Elevated at home for the most part.       Preventive Medicine  Adult topics discussed:  Diet: healthy diet. Low salt Exercise: 5 days a week, at least 30 minutes of aerobic exercise.      Signed, Mina Marble, MD, [facility] 11/07/2013 11:05 AM

## 2013-12-31 ENCOUNTER — Other Ambulatory Visit: Payer: Self-pay | Admitting: *Deleted

## 2013-12-31 MED ORDER — POTASSIUM CHLORIDE CRYS ER 20 MEQ PO TBCR: 20.0000 meq | EXTENDED_RELEASE_TABLET | Freq: Every day | ORAL | Status: DC

## 2014-01-14 DIAGNOSIS — I2510 Atherosclerotic heart disease of native coronary artery without angina pectoris: Secondary | ICD-10-CM | POA: Diagnosis not present

## 2014-01-14 DIAGNOSIS — IMO0001 Reserved for inherently not codable concepts without codable children: Secondary | ICD-10-CM | POA: Diagnosis not present

## 2014-01-14 DIAGNOSIS — Z0000 Encounter for general adult medical examination without abnormal findings: Secondary | ICD-10-CM | POA: Diagnosis not present

## 2014-01-14 DIAGNOSIS — Z1331 Encounter for screening for depression: Secondary | ICD-10-CM | POA: Diagnosis not present

## 2014-01-14 DIAGNOSIS — N400 Enlarged prostate without lower urinary tract symptoms: Secondary | ICD-10-CM | POA: Diagnosis not present

## 2014-01-14 DIAGNOSIS — E559 Vitamin D deficiency, unspecified: Secondary | ICD-10-CM | POA: Diagnosis not present

## 2014-01-14 DIAGNOSIS — E1149 Type 2 diabetes mellitus with other diabetic neurological complication: Secondary | ICD-10-CM | POA: Diagnosis not present

## 2014-01-14 DIAGNOSIS — I10 Essential (primary) hypertension: Secondary | ICD-10-CM | POA: Diagnosis not present

## 2014-01-14 DIAGNOSIS — E785 Hyperlipidemia, unspecified: Secondary | ICD-10-CM | POA: Diagnosis not present

## 2014-01-21 DIAGNOSIS — L821 Other seborrheic keratosis: Secondary | ICD-10-CM | POA: Diagnosis not present

## 2014-03-13 DIAGNOSIS — H8110 Benign paroxysmal vertigo, unspecified ear: Secondary | ICD-10-CM | POA: Diagnosis not present

## 2014-03-22 DIAGNOSIS — I69998 Other sequelae following unspecified cerebrovascular disease: Secondary | ICD-10-CM | POA: Diagnosis not present

## 2014-03-26 DIAGNOSIS — M47817 Spondylosis without myelopathy or radiculopathy, lumbosacral region: Secondary | ICD-10-CM | POA: Diagnosis not present

## 2014-03-26 DIAGNOSIS — M1610 Unilateral primary osteoarthritis, unspecified hip: Secondary | ICD-10-CM | POA: Diagnosis not present

## 2014-03-26 DIAGNOSIS — M25569 Pain in unspecified knee: Secondary | ICD-10-CM | POA: Diagnosis not present

## 2014-03-26 DIAGNOSIS — M25559 Pain in unspecified hip: Secondary | ICD-10-CM | POA: Diagnosis not present

## 2014-03-26 DIAGNOSIS — M961 Postlaminectomy syndrome, not elsewhere classified: Secondary | ICD-10-CM | POA: Diagnosis not present

## 2014-03-26 DIAGNOSIS — M169 Osteoarthritis of hip, unspecified: Secondary | ICD-10-CM | POA: Diagnosis not present

## 2014-04-03 DIAGNOSIS — R42 Dizziness and giddiness: Secondary | ICD-10-CM | POA: Diagnosis not present

## 2014-04-03 DIAGNOSIS — I69998 Other sequelae following unspecified cerebrovascular disease: Secondary | ICD-10-CM | POA: Diagnosis not present

## 2014-04-05 ENCOUNTER — Encounter (HOSPITAL_COMMUNITY): Payer: Self-pay | Admitting: Emergency Medicine

## 2014-04-05 ENCOUNTER — Inpatient Hospital Stay (HOSPITAL_COMMUNITY)
Admission: EM | Admit: 2014-04-05 | Discharge: 2014-04-07 | DRG: 313 | Disposition: A | Discharge status: Home / Self Care | Payer: Medicare Other | Attending: Internal Medicine | Admitting: Internal Medicine

## 2014-04-05 ENCOUNTER — Emergency Department (HOSPITAL_COMMUNITY): Payer: Medicare Other

## 2014-04-05 ENCOUNTER — Other Ambulatory Visit: Payer: Self-pay

## 2014-04-05 DIAGNOSIS — Z794 Long term (current) use of insulin: Secondary | ICD-10-CM | POA: Diagnosis not present

## 2014-04-05 DIAGNOSIS — Z96649 Presence of unspecified artificial hip joint: Secondary | ICD-10-CM

## 2014-04-05 DIAGNOSIS — Z7982 Long term (current) use of aspirin: Secondary | ICD-10-CM

## 2014-04-05 DIAGNOSIS — G8929 Other chronic pain: Secondary | ICD-10-CM | POA: Diagnosis present

## 2014-04-05 DIAGNOSIS — D696 Thrombocytopenia, unspecified: Secondary | ICD-10-CM | POA: Diagnosis not present

## 2014-04-05 DIAGNOSIS — Z7902 Long term (current) use of antithrombotics/antiplatelets: Secondary | ICD-10-CM | POA: Diagnosis not present

## 2014-04-05 DIAGNOSIS — I209 Angina pectoris, unspecified: Secondary | ICD-10-CM

## 2014-04-05 DIAGNOSIS — R072 Precordial pain: Secondary | ICD-10-CM | POA: Diagnosis not present

## 2014-04-05 DIAGNOSIS — Z87891 Personal history of nicotine dependence: Secondary | ICD-10-CM | POA: Diagnosis not present

## 2014-04-05 DIAGNOSIS — E538 Deficiency of other specified B group vitamins: Secondary | ICD-10-CM | POA: Diagnosis present

## 2014-04-05 DIAGNOSIS — M79609 Pain in unspecified limb: Secondary | ICD-10-CM | POA: Diagnosis present

## 2014-04-05 DIAGNOSIS — I2510 Atherosclerotic heart disease of native coronary artery without angina pectoris: Secondary | ICD-10-CM | POA: Diagnosis not present

## 2014-04-05 DIAGNOSIS — K449 Diaphragmatic hernia without obstruction or gangrene: Secondary | ICD-10-CM | POA: Diagnosis present

## 2014-04-05 DIAGNOSIS — M129 Arthropathy, unspecified: Secondary | ICD-10-CM | POA: Diagnosis present

## 2014-04-05 DIAGNOSIS — M545 Low back pain, unspecified: Secondary | ICD-10-CM | POA: Diagnosis present

## 2014-04-05 DIAGNOSIS — IMO0001 Reserved for inherently not codable concepts without codable children: Secondary | ICD-10-CM

## 2014-04-05 DIAGNOSIS — Z9861 Coronary angioplasty status: Secondary | ICD-10-CM

## 2014-04-05 DIAGNOSIS — R079 Chest pain, unspecified: Secondary | ICD-10-CM | POA: Diagnosis not present

## 2014-04-05 DIAGNOSIS — E785 Hyperlipidemia, unspecified: Secondary | ICD-10-CM | POA: Diagnosis not present

## 2014-04-05 DIAGNOSIS — I200 Unstable angina: Secondary | ICD-10-CM | POA: Diagnosis present

## 2014-04-05 DIAGNOSIS — E1165 Type 2 diabetes mellitus with hyperglycemia: Secondary | ICD-10-CM

## 2014-04-05 DIAGNOSIS — Z79899 Other long term (current) drug therapy: Secondary | ICD-10-CM | POA: Diagnosis not present

## 2014-04-05 DIAGNOSIS — I498 Other specified cardiac arrhythmias: Secondary | ICD-10-CM | POA: Diagnosis present

## 2014-04-05 DIAGNOSIS — E119 Type 2 diabetes mellitus without complications: Secondary | ICD-10-CM | POA: Diagnosis not present

## 2014-04-05 DIAGNOSIS — E1142 Type 2 diabetes mellitus with diabetic polyneuropathy: Secondary | ICD-10-CM | POA: Diagnosis present

## 2014-04-05 DIAGNOSIS — D649 Anemia, unspecified: Secondary | ICD-10-CM | POA: Diagnosis present

## 2014-04-05 DIAGNOSIS — M109 Gout, unspecified: Secondary | ICD-10-CM | POA: Diagnosis present

## 2014-04-05 DIAGNOSIS — I25119 Atherosclerotic heart disease of native coronary artery with unspecified angina pectoris: Secondary | ICD-10-CM

## 2014-04-05 DIAGNOSIS — I10 Essential (primary) hypertension: Secondary | ICD-10-CM | POA: Diagnosis present

## 2014-04-05 HISTORY — DX: Pseudocyst of pancreas: K86.3

## 2014-04-05 LAB — CBC
HCT: 37.5 % — ABNORMAL LOW (ref 39.0–52.0)
Hemoglobin: 13.3 g/dL (ref 13.0–17.0)
MCH: 30.9 pg (ref 26.0–34.0)
MCHC: 35.5 g/dL (ref 30.0–36.0)
MCV: 87 fL (ref 78.0–100.0)
Platelets: 133 10*3/uL — ABNORMAL LOW (ref 150–400)
RBC: 4.31 MIL/uL (ref 4.22–5.81)
RDW: 14.1 % (ref 11.5–15.5)
WBC: 5 10*3/uL (ref 4.0–10.5)

## 2014-04-05 LAB — BASIC METABOLIC PANEL
Anion gap: 15 (ref 5–15)
BUN: 21 mg/dL (ref 6–23)
CO2: 27 mEq/L (ref 19–32)
Calcium: 10.2 mg/dL (ref 8.4–10.5)
Chloride: 104 mEq/L (ref 96–112)
Creatinine, Ser: 1.01 mg/dL (ref 0.50–1.35)
GFR calc Af Amer: 83 mL/min — ABNORMAL LOW (ref >90)
GFR calc non Af Amer: 72 mL/min — ABNORMAL LOW (ref >90)
Glucose, Bld: 125 mg/dL — ABNORMAL HIGH (ref 70–99)
Potassium: 3.9 mEq/L (ref 3.7–5.3)
Sodium: 146 mEq/L (ref 137–147)

## 2014-04-05 LAB — TSH: TSH: 1.16 u[IU]/mL (ref 0.350–4.500)

## 2014-04-05 LAB — GLUCOSE, CAPILLARY: Glucose-Capillary: 385 mg/dL — ABNORMAL HIGH (ref 70–99)

## 2014-04-05 LAB — I-STAT TROPONIN, ED: Troponin i, poc: 0.01 ng/mL (ref 0.00–0.08)

## 2014-04-05 LAB — TROPONIN I: Troponin I: <0.3 ng/mL (ref <0.30)

## 2014-04-05 MED ORDER — TELMISARTAN-HCTZ 80-12.5 MG PO TABS: 2.0000 | ORAL_TABLET | Freq: Every day | ORAL | Status: DC

## 2014-04-05 MED ORDER — NITROGLYCERIN IN D5W 200-5 MCG/ML-% IV SOLN
10.0000 ug/min | INTRAVENOUS | Status: DC
Start: 2014-04-05 — End: 2014-04-06
  Administered 2014-04-05: 10 ug/min via INTRAVENOUS
  Filled 2014-04-05: qty 250

## 2014-04-05 MED ORDER — ACETAMINOPHEN 325 MG PO TABS: 650.0000 mg | ORAL_TABLET | ORAL | Status: DC | PRN

## 2014-04-05 MED ORDER — HEPARIN BOLUS VIA INFUSION
4000.0000 [IU] | Freq: Once | INTRAVENOUS | Status: AC
  Administered 2014-04-05: 4000 [IU] via INTRAVENOUS
  Filled 2014-04-05: qty 4000

## 2014-04-05 MED ORDER — INSULIN ASPART 100 UNIT/ML ~~LOC~~ SOLN
0.0000 [IU] | Freq: Three times a day (TID) | SUBCUTANEOUS | Status: DC
  Administered 2014-04-06: 2 [IU] via SUBCUTANEOUS
  Administered 2014-04-06: 7 [IU] via SUBCUTANEOUS
  Administered 2014-04-07: 3 [IU] via SUBCUTANEOUS

## 2014-04-05 MED ORDER — SODIUM CHLORIDE 0.9 % IJ SOLN: 3.0000 mL | INTRAMUSCULAR | Status: DC | PRN

## 2014-04-05 MED ORDER — DILTIAZEM HCL 25 MG/5ML IV SOLN: 20.0000 mg | Freq: Once | INTRAVENOUS | Status: DC

## 2014-04-05 MED ORDER — DILTIAZEM HCL 100 MG IV SOLR: 5.0000 mg/h | INTRAVENOUS | Status: DC

## 2014-04-05 MED ORDER — POTASSIUM CHLORIDE CRYS ER 20 MEQ PO TBCR
20.0000 meq | EXTENDED_RELEASE_TABLET | Freq: Every day | ORAL | Status: DC
  Administered 2014-04-06: 20 meq via ORAL
  Filled 2014-04-05 (×2): qty 1

## 2014-04-05 MED ORDER — GABAPENTIN 300 MG PO CAPS
300.0000 mg | ORAL_CAPSULE | Freq: Two times a day (BID) | ORAL | Status: DC
  Administered 2014-04-05 – 2014-04-06 (×3): 300 mg via ORAL
  Filled 2014-04-05 (×5): qty 1

## 2014-04-05 MED ORDER — ONDANSETRON HCL 4 MG/2ML IJ SOLN: 4.0000 mg | Freq: Four times a day (QID) | INTRAMUSCULAR | Status: DC | PRN

## 2014-04-05 MED ORDER — NISOLDIPINE ER 20 MG PO TB24: 20.0000 mg | ORAL_TABLET | Freq: Every evening | ORAL | Status: DC

## 2014-04-05 MED ORDER — ALLOPURINOL 300 MG PO TABS
300.0000 mg | ORAL_TABLET | Freq: Every day | ORAL | Status: DC
  Administered 2014-04-06: 300 mg via ORAL
  Filled 2014-04-05 (×2): qty 1

## 2014-04-05 MED ORDER — AMLODIPINE BESYLATE 5 MG PO TABS
5.0000 mg | ORAL_TABLET | Freq: Every day | ORAL | Status: DC
  Administered 2014-04-06: 5 mg via ORAL
  Filled 2014-04-05 (×2): qty 1

## 2014-04-05 MED ORDER — IRBESARTAN 300 MG PO TABS
300.0000 mg | ORAL_TABLET | Freq: Every day | ORAL | Status: DC
  Administered 2014-04-06: 300 mg via ORAL
  Filled 2014-04-05 (×2): qty 1

## 2014-04-05 MED ORDER — HEPARIN (PORCINE) IN NACL 100-0.45 UNIT/ML-% IJ SOLN
1250.0000 [IU]/h | INTRAMUSCULAR | Status: DC
  Administered 2014-04-05: 1250 [IU]/h via INTRAVENOUS
  Filled 2014-04-05 (×3): qty 250

## 2014-04-05 MED ORDER — TERAZOSIN HCL 5 MG PO CAPS
5.0000 mg | ORAL_CAPSULE | Freq: Every day | ORAL | Status: DC
  Administered 2014-04-05 – 2014-04-06 (×2): 5 mg via ORAL
  Filled 2014-04-05 (×3): qty 1

## 2014-04-05 MED ORDER — HYDROCHLOROTHIAZIDE 12.5 MG PO CAPS
12.5000 mg | ORAL_CAPSULE | Freq: Every day | ORAL | Status: DC
  Administered 2014-04-06: 12.5 mg via ORAL
  Filled 2014-04-05 (×2): qty 1

## 2014-04-05 MED ORDER — SODIUM CHLORIDE 0.9 % IJ SOLN
3.0000 mL | Freq: Two times a day (BID) | INTRAMUSCULAR | Status: DC
  Administered 2014-04-06: 3 mL via INTRAVENOUS

## 2014-04-05 MED ORDER — ASPIRIN EC 325 MG PO TBEC
325.0000 mg | DELAYED_RELEASE_TABLET | Freq: Every day | ORAL | Status: DC
  Administered 2014-04-06 – 2014-04-07 (×2): 325 mg via ORAL
  Filled 2014-04-05 (×2): qty 1

## 2014-04-05 MED ORDER — CLOPIDOGREL BISULFATE 75 MG PO TABS
75.0000 mg | ORAL_TABLET | Freq: Every day | ORAL | Status: DC
  Administered 2014-04-06 – 2014-04-07 (×2): 75 mg via ORAL
  Filled 2014-04-05 (×2): qty 1

## 2014-04-05 MED ORDER — SODIUM CHLORIDE 0.9 % IV SOLN: 250.0000 mL | INTRAVENOUS | Status: DC | PRN

## 2014-04-05 MED ORDER — HYDROCODONE-ACETAMINOPHEN 5-325 MG PO TABS: 1.0000 | ORAL_TABLET | Freq: Four times a day (QID) | ORAL | Status: DC | PRN

## 2014-04-05 MED ORDER — NITROGLYCERIN 0.4 MG SL SUBL: 0.4000 mg | SUBLINGUAL_TABLET | SUBLINGUAL | Status: DC | PRN

## 2014-04-05 MED ORDER — NON FORMULARY: 10.0000 mg | Status: DC

## 2014-04-05 MED ORDER — ROSUVASTATIN CALCIUM 10 MG PO TABS
10.0000 mg | ORAL_TABLET | Freq: Every day | ORAL | Status: DC
  Administered 2014-04-05 – 2014-04-06 (×2): 10 mg via ORAL
  Filled 2014-04-05 (×3): qty 1

## 2014-04-05 NOTE — Progress Notes (Signed)
ANTICOAGULATION CONSULT NOTE - Initial Consult  Pharmacy Consult:  Heparin Indication:  ACS  Allergies  Allergen Reactions  . Fish Oil Nausea Only  . Simvastatin     SEVERE MYALGIAS   . Zetia [Ezetimibe]     MYALGIAS    Patient Measurements: Height: 5' 8.9" (175 cm) Weight: 203 lb 0.7 oz (92.1 kg) IBW/kg (Calculated) : 70.47 Heparin Dosing Weight: 90 kg  Vital Signs: BP: 138/76 mmHg (07/03 1500) Pulse Rate: 64 (07/03 1500)  Labs:  Recent Labs  04/05/14 1415  HGB 13.3  HCT 37.5*  PLT 133*  CREATININE 1.01    Estimated Creatinine Clearance: 72.9 ml/min (by C-G formula based on Cr of 1.01).   Medical History: Past Medical History  Diagnosis Date  . Heart murmur   . Coronary artery disease   . Arthritis   . Diabetes mellitus     Insulin dependent  . Dysrhythmia   . H/O hiatal hernia   . Chronic lower back pain   . Chronic leg pain     right  . Angina   . Gout     right wrist; right foot; right elvow; have had it since 1970's  . History of total hip replacement   . Hypertension     Diagnosed 1995   . Vitamin B 12 deficiency     orally replaced  . Diverticulosis   . History of echocardiogram     aortic sclerosis per echo 12/09 EF 65%, otherwise normal  . Allergic rhinitis   . Diabetic neuropathy     MILD  . Gilbert syndrome   . History of hemorrhoids     BLEEDING  . Allergic rhinitis      Assessment: 36 YOM presented with chest pain and Pharmacy consulted to start IV heparin for ACS.  Baseline labs and home meds reviewed.   Goal of Therapy:  Heparin level 0.3-0.7 units/ml Monitor platelets by anticoagulation protocol: Yes    Plan:  - Heparin 4000 units IV bolus x 1, then - Heparin gtt at 1250 units/hr - Check 8 hr HL - Daily HL / CBC - Watch plts    Thuy D. Mina Marble, PharmD, BCPS Pager:  [phone] 04/05/2014, 4:11 PM

## 2014-04-05 NOTE — Progress Notes (Addendum)
Will hold glimepiride since he will be NPO after midnight. This can be resumed if we make a decision about procedures tomorrow. Add SSI. Asking pharmacy to help clarify Lantus "sliding scale." Dayna Dunn PA-C

## 2014-04-05 NOTE — ED Provider Notes (Signed)
CSN: [id number]     Arrival date & time 04/05/14  1356 History   First MD Initiated Contact with Patient 04/05/14 1528     Chief Complaint  Patient presents with  . Chest Pain  . Dizziness     (Consider location/radiation/quality/duration/timing/severity/associated sxs/prior Treatment) Patient is a 73 y.o. male presenting with chest pain and dizziness. The history is provided by the patient.  Chest Pain Associated symptoms: dizziness   Dizziness Associated symptoms: chest pain   He has had a vague discomfort in his left lower anterior chest since yesterday. The discomfort comes and goes and describes it as feeling like indigestion but it did not respond to medication for indigestion. There is no associated dyspnea, nausea but he did have diaphoresis. He has had several episodes while laying in a hospital bed waiting for me to see him. Pains will last for 2-3 minutes before resolving. He rates the pain since 3/10. There is no radiation of pain. He has not taken anything for it. Symptoms are similar to what he had when he had his last stent placed about 2.5 years ago.  Past Medical History  Diagnosis Date  . Heart murmur   . Coronary artery disease   . Arthritis   . Diabetes mellitus     Insulin dependent  . Dysrhythmia   . H/O hiatal hernia   . Chronic lower back pain   . Chronic leg pain     right  . Angina   . Gout     right wrist; right foot; right elvow; have had it since 1970's  . History of total hip replacement   . Hypertension     Diagnosed 1995   . Vitamin B 12 deficiency     orally replaced  . Diverticulosis   . History of echocardiogram     aortic sclerosis per echo 12/09 EF 65%, otherwise normal  . Allergic rhinitis   . Diabetic neuropathy     MILD  . Gilbert syndrome   . History of hemorrhoids     BLEEDING  . Allergic rhinitis    Past Surgical History  Procedure Laterality Date  . Knee arthroscopy  1990's    left  . Joint replacement  04/03/2002    right  hip replacment  . Tonsillectomy  ~ 1948  . Cholecystectomy  1990's  . Inguinal hernia repair  2003    right  . Back surgery      "total of 3 times" S/P fall   . Coronary angioplasty with stent placement  09/30/2011    "1 then; makes a total of 4"  . Coronary angioplasty with stent placement  11/11/11    "1; makes a total of 5"  . Coronary angioplasty  11/11/11   No family history on file. History  Substance Use Topics  . Smoking status: Former Smoker -- 0.50 packs/day for 3 years    Types: Cigarettes    Quit date: 11/11/1973  . Smokeless tobacco: Current User    Types: Chew     Comment: quit smoking cigarettes 1970's  . Alcohol Use: No    Review of Systems  Cardiovascular: Positive for chest pain.  Neurological: Positive for dizziness.  All other systems reviewed and are negative.     Allergies  Fish oil; Simvastatin; and Zetia  Home Medications   Prior to Admission medications   Medication Sig Start Date End Date Taking? Authorizing Provider  acetaminophen (TYLENOL) 325 MG tablet Take 650 mg by mouth every 4 (  four) hours as needed for mild pain.    Yes Historical Provider, MD  allopurinol (ZYLOPRIM) 300 MG tablet Take 300 mg by mouth daily.    Yes Historical Provider, MD  amLODipine (NORVASC) 5 MG tablet Take 5 mg by mouth daily.   Yes Historical Provider, MD  aspirin EC 325 MG tablet Take 325 mg by mouth daily.    Yes Historical Provider, MD  clopidogrel (PLAVIX) 75 MG tablet TAKE ONE TABLET BY MOUTH ONE TIME DAILY 07/18/13  Yes Jettie Booze, MD  gabapentin (NEURONTIN) 300 MG capsule Take 300 mg by mouth 2 (two) times daily.   Yes Historical Provider, MD  glimepiride (AMARYL) 4 MG tablet Take 4 mg by mouth 2 (two) times daily.    Yes Historical Provider, MD  HYDROcodone-acetaminophen (NORCO) 5-325 MG per tablet Take 1-2 tablets by mouth every 6 (six) hours as needed. For pain   Yes Historical Provider, MD  insulin glargine (LANTUS) 100 UNIT/ML injection Inject  30-50 Units into the skin daily. Sliding scale   Yes Historical Provider, MD  metFORMIN (GLUCOPHAGE) 1000 MG tablet Take 1 tablet (1,000 mg total) by mouth 2 (two) times daily. 11/13/11  Yes Jettie Booze, MD  nisoldipine (SULAR) 20 MG 24 hr tablet Take 20 mg by mouth every evening.    Yes Historical Provider, MD  potassium chloride SA (K-DUR,KLOR-CON) 20 MEQ tablet Take 1 tablet (20 mEq total) by mouth daily. 12/31/13 12/22/15 Yes Jettie Booze, MD  rosuvastatin (CRESTOR) 10 MG tablet Take 10 mg by mouth See admin instructions. TAKE1 TABLET MON AND FRI   Yes Historical Provider, MD  telmisartan-hydrochlorothiazide (MICARDIS HCT) 80-12.5 MG per tablet 2 tablets by mouth daily. 11/07/13  Yes Jettie Booze, MD  terazosin (HYTRIN) 5 MG capsule Take 5 mg by mouth at bedtime.    Yes Historical Provider, MD  nitroGLYCERIN (NITROSTAT) 0.4 MG SL tablet Place 0.4 mg under the tongue every 5 (five) minutes as needed for chest pain.    Historical Provider, MD   BP 132/68  Pulse 73  Resp 20  SpO2 97% Physical Exam  Nursing note and vitals reviewed.  73 year old male, resting comfortably and in no acute distress. Vital signs are normal. Oxygen saturation is 97%, which is normal. Head is normocephalic and atraumatic. PERRLA, EOMI. Oropharynx is clear. Neck is nontender and supple without adenopathy or JVD. Back is nontender and there is no CVA tenderness. Lungs are clear without rales, wheezes, or rhonchi. Chest is nontender. Heart has regular rate and rhythm without murmur. Abdomen is soft, flat, nontender without masses or hepatosplenomegaly and peristalsis is normoactive. Extremities have no cyanosis or edema, full range of motion is present. Mild venous stasis changes are present. Skin is warm and dry without rash. Neurologic: Mental status is normal, cranial nerves are intact, there are no motor or sensory deficits.  ED Course  Procedures (including critical care time) Labs  Review Results for orders placed during the hospital encounter of 04/05/14  CBC      Result Value Ref Range   WBC 5.0  4.0 - 10.5 K/uL   RBC 4.31  4.22 - 5.81 MIL/uL   Hemoglobin 13.3  13.0 - 17.0 g/dL   HCT 37.5 (*) 39.0 - 52.0 %   MCV 87.0  78.0 - 100.0 fL   MCH 30.9  26.0 - 34.0 pg   MCHC 35.5  30.0 - 36.0 g/dL   RDW 14.1  11.5 - 15.5 %   Platelets  133 (*) 150 - 400 K/uL  BASIC METABOLIC PANEL      Result Value Ref Range   Sodium 146  137 - 147 mEq/L   Potassium 3.9  3.7 - 5.3 mEq/L   Chloride 104  96 - 112 mEq/L   CO2 27  19 - 32 mEq/L   Glucose, Bld 125 (*) 70 - 99 mg/dL   BUN 21  6 - 23 mg/dL   Creatinine, Ser 1.01  0.50 - 1.35 mg/dL   Calcium 10.2  8.4 - 10.5 mg/dL   GFR calc non Af Amer 72 (*) >90 mL/min   GFR calc Af Amer 83 (*) >90 mL/min   Anion gap 15  5 - 15  TROPONIN I      Result Value Ref Range   Troponin I <0.30  <0.30 ng/mL  TSH      Result Value Ref Range   TSH 1.160  0.350 - 4.500 uIU/mL  GLUCOSE, CAPILLARY      Result Value Ref Range   Glucose-Capillary 385 (*) 70 - 99 mg/dL  GLUCOSE, CAPILLARY      Result Value Ref Range   Glucose-Capillary 279 (*) 70 - 99 mg/dL  I-STAT TROPOININ, ED      Result Value Ref Range   Troponin i, poc 0.01  0.00 - 0.08 ng/mL   Comment 3            Imaging Review Dg Chest Port 1 View  04/05/2014   CLINICAL DATA:  Chest pain  EXAM: PORTABLE CHEST - 1 VIEW  COMPARISON:  11/15/2011  FINDINGS: Low lung volumes.  Lungs are clear.  No pleural effusion or pneumothorax.  The heart is normal in size.  IMPRESSION: No evidence of acute cardiopulmonary disease.  Low lung volumes.   Electronically Signed   By: Julian Hy M.D.   On: 04/05/2014 16:44     Date: 04/05/2014  Rate: 76  Rhythm: normal sinus rhythm  QRS Axis: normal  Intervals: normal  ST/T Wave abnormalities: nonspecific ST/T changes  Conduction Disutrbances:none  Narrative Interpretation: Nonspecific ST and T changes. When compared with ECG of 11/07/2013, no  significant changes are seen.  Old EKG Reviewed: unchanged    MDM   Final diagnoses:  Atherosclerosis of native coronary artery of native heart with angina pectoris  Essential hypertension, benign  Type II or unspecified type diabetes mellitus without mention of complication, uncontrolled    Chest pain syndrome which is worrisome for possible unstable angina. Symptoms are very similar to what he had with a non-STEMI in 2013. Old records are reviewed and he had a PTCA done very vessel is too small to place a stent and he also had a stent placed in an obtuse marginal artery. Because of the similarities in symptoms, he will be treated as unstable angina until proven otherwise. He started on heparin and nitroglycerin drips. Aspirin is given because he took his normal dose of 325 mg of aspirin this morning. Case is discussed with Dr. Harrington Challenger of cardiology service who agrees to admit the patient.    Delora Fuel, MD 26/94/85 [id number]

## 2014-04-05 NOTE — ED Notes (Signed)
MD at bedside. 

## 2014-04-05 NOTE — ED Notes (Signed)
Velna Hatchet, RN attempted report.

## 2014-04-05 NOTE — ED Notes (Signed)
Pt. Stated, I started having some chest pain yesterday when I got up and it got better and i just didn't feel good,.  It was back today.

## 2014-04-05 NOTE — ED Notes (Signed)
Attempted Report 

## 2014-04-05 NOTE — ED Notes (Addendum)
Reports Dull pain intermittently under the left breast with no rad. Denies pain right now. Denies N/V weakness or back pain or swelling in extremities.

## 2014-04-05 NOTE — H&P (Signed)
Primary Physician: Primary Cardiologist:  Christ Kick.     HPI: Patient is a 73 yo who presents to [facility] with CP Pateint has history of CAD  He is followed in cardiology      He is s/p PCI/stents to LAD, LCx and RCA. Cath in Feb 2013.  LM normla.  LAD patent  LCx mid stent patent; 95% stenosis post stent.  RCA patent mid vessel stent with 50 to 60^ prox dz.  Patient underwent PCI/DES to mid LCx.  Aslo underewnt DES to mid OM2. And PICI to distal LCx.  Small residual dissection in distal vessel (small) He was last seen in clinic in Feb.   Yesterday the patient started having CP  Pain ws a pressure sensation  Not sever 3/10 in intensity  L parasternal area  Lasted about 10 min  Would then ease off  Then come back  Not pleuritic  No associated SOB  He says he just didn't feel good yesterday.  Did activitis  MAy have been a little more tired.  Went to bed Woke up this AM and came back  Came and went  Similar to yesterday  Daughter brought him to ER Pain prior to stent was more subxyphoid  It was similar in quality howver though it was more severe.  It would come/go  No associated SOB then either  Wife says he may be more tired.   Denies F/C  No N/V  No diarrhea  No recent physical exertion beyond normal.    Never took NTG because it just wan't severe enough Patient did feel a little dizzy earlier with some nausea.     Past Medical History  Diagnosis Date  . Heart murmur   . Coronary artery disease   . Arthritis   . Diabetes mellitus     Insulin dependent  . Dysrhythmia   . H/O hiatal hernia   . Chronic lower back pain   . Chronic leg pain     right  . Angina   . Gout     right wrist; right foot; right elvow; have had it since 1970's  . History of total hip replacement   . Hypertension     Diagnosed 1995   . Vitamin B 12 deficiency     orally replaced  . Diverticulosis   . History of echocardiogram     aortic sclerosis per echo 12/09 EF 65%, otherwise normal  . Allergic  rhinitis   . Diabetic neuropathy     MILD  . Gilbert syndrome   . History of hemorrhoids     BLEEDING  . Allergic rhinitis      (Not in a hospital admission)      Infusions: . diltiazem (CARDIZEM) infusion    . diltiazem    . heparin    . heparin    . nitroGLYCERIN      Allergies  Allergen Reactions  . Fish Oil Nausea Only  . Simvastatin     SEVERE MYALGIAS   . Zetia [Ezetimibe]     MYALGIAS    History   Social History  . Marital Status: Married    Spouse Name: N/A    Number of Children: N/A  . Years of Education: N/A   Occupational History  . Not on file.   Social History Main Topics  . Smoking status: Former Smoker -- 0.50 packs/day for 3 years    Types: Cigarettes    Quit date: 11/11/1973  . Smokeless tobacco: Current  User    Types: Chew     Comment: quit smoking cigarettes 1970's  . Alcohol Use: No  . Drug Use: No  . Sexual Activity: No   Other Topics Concern  . Not on file   Social History Narrative  . No narrative on file    No family history on file.  REVIEW OF SYSTEMS:  All systems reviewed  Negative to the above problem except as noted above.    PHYSICAL EXAM: Filed Vitals:   04/05/14 1500  BP: 138/76  Pulse: 64  Resp: 20    No intake or output data in the 24 hours ending 04/05/14 1619  General:  Well appearing. No respiratory difficulty HEENT: normal Neck: supple. no JVD. Carotids 2+ bilat; no bruits. No lymphadenopathy or thryomegaly appreciated. Cor: PMI nondisplaced. Regular rate & rhythm. No rubs, gallops or murmurs. Chest   nontender   Lungs: clear Abdomen: soft, nontender, nondistended. No hepatosplenomegaly. No bruits or masses. Good bowel sounds. Extremities: no cyanosis, clubbing, rash, edema Neuro: alert & oriented x 3, cranial nerves grossly intact. moves all 4 extremities w/o difficulty. Affect pleasant.  ECG:  SR 76 bpm  Nonspecific ST T wave changes    Results for orders placed during the hospital  encounter of 04/05/14 (from the past 24 hour(s))  CBC     Status: Abnormal   Collection Time    04/05/14  2:15 PM      Result Value Ref Range   WBC 5.0  4.0 - 10.5 K/uL   RBC 4.31  4.22 - 5.81 MIL/uL   Hemoglobin 13.3  13.0 - 17.0 g/dL   HCT 37.5 (*) 39.0 - 52.0 %   MCV 87.0  78.0 - 100.0 fL   MCH 30.9  26.0 - 34.0 pg   MCHC 35.5  30.0 - 36.0 g/dL   RDW 14.1  11.5 - 15.5 %   Platelets 133 (*) 150 - 400 K/uL  BASIC METABOLIC PANEL     Status: Abnormal   Collection Time    04/05/14  2:15 PM      Result Value Ref Range   Sodium 146  137 - 147 mEq/L   Potassium 3.9  3.7 - 5.3 mEq/L   Chloride 104  96 - 112 mEq/L   CO2 27  19 - 32 mEq/L   Glucose, Bld 125 (*) 70 - 99 mg/dL   BUN 21  6 - 23 mg/dL   Creatinine, Ser 1.01  0.50 - 1.35 mg/dL   Calcium 10.2  8.4 - 10.5 mg/dL   GFR calc non Af Amer 72 (*) >90 mL/min   GFR calc Af Amer 83 (*) >90 mL/min   Anion gap 15  5 - 15  I-STAT TROPOININ, ED     Status: None   Collection Time    04/05/14  2:24 PM      Result Value Ref Range   Troponin i, poc 0.01  0.00 - 0.08 ng/mL   Comment 3            No results found.   ASSESSMENT:  72 yo with known CAD  6 stents total  Now with CP   Similar in quality though less severe than in 2013.  A little more lateral Would recomm admit, r/o for MI Patient has been started on heparin and NTG   Will need evaluation when r/o  Consider noninvasive in am if symtoms gone Not unreasonable to consider cath given that his symtpoms prior were not  completely typical.   Will keep NPO after MN    2.  HTN  Follow  3.  HL  Check in AM.

## 2014-04-05 NOTE — ED Notes (Signed)
Cardiology at bedside.

## 2014-04-06 ENCOUNTER — Inpatient Hospital Stay (HOSPITAL_COMMUNITY): Payer: Medicare Other

## 2014-04-06 ENCOUNTER — Observation Stay (HOSPITAL_COMMUNITY): Payer: Medicare Other

## 2014-04-06 DIAGNOSIS — I209 Angina pectoris, unspecified: Secondary | ICD-10-CM | POA: Diagnosis not present

## 2014-04-06 DIAGNOSIS — E1142 Type 2 diabetes mellitus with diabetic polyneuropathy: Secondary | ICD-10-CM | POA: Diagnosis not present

## 2014-04-06 DIAGNOSIS — R079 Chest pain, unspecified: Secondary | ICD-10-CM

## 2014-04-06 DIAGNOSIS — I200 Unstable angina: Secondary | ICD-10-CM | POA: Diagnosis not present

## 2014-04-06 DIAGNOSIS — D696 Thrombocytopenia, unspecified: Secondary | ICD-10-CM | POA: Diagnosis not present

## 2014-04-06 DIAGNOSIS — I2510 Atherosclerotic heart disease of native coronary artery without angina pectoris: Secondary | ICD-10-CM | POA: Diagnosis not present

## 2014-04-06 DIAGNOSIS — R072 Precordial pain: Secondary | ICD-10-CM | POA: Diagnosis not present

## 2014-04-06 LAB — BASIC METABOLIC PANEL
Anion gap: 12 (ref 5–15)
BUN: 19 mg/dL (ref 6–23)
CO2: 28 mEq/L (ref 19–32)
Calcium: 9.1 mg/dL (ref 8.4–10.5)
Chloride: 102 mEq/L (ref 96–112)
Creatinine, Ser: 0.98 mg/dL (ref 0.50–1.35)
GFR calc Af Amer: >90 mL/min (ref >90)
GFR calc non Af Amer: 80 mL/min — ABNORMAL LOW (ref >90)
Glucose, Bld: 215 mg/dL — ABNORMAL HIGH (ref 70–99)
Potassium: 3.5 mEq/L — ABNORMAL LOW (ref 3.7–5.3)
Sodium: 142 mEq/L (ref 137–147)

## 2014-04-06 LAB — LIPID PANEL
Cholesterol: 117 mg/dL (ref 0–200)
HDL: 34 mg/dL — ABNORMAL LOW (ref >39)
LDL Cholesterol: 58 mg/dL (ref 0–99)
Total CHOL/HDL Ratio: 3.4 RATIO
Triglycerides: 123 mg/dL (ref <150)
VLDL: 25 mg/dL (ref 0–40)

## 2014-04-06 LAB — TROPONIN I
Troponin I: <0.3 ng/mL (ref <0.30)
Troponin I: <0.3 ng/mL (ref <0.30)

## 2014-04-06 LAB — CBC
HCT: 33 % — ABNORMAL LOW (ref 39.0–52.0)
Hemoglobin: 11.7 g/dL — ABNORMAL LOW (ref 13.0–17.0)
MCH: 30.7 pg (ref 26.0–34.0)
MCHC: 35.5 g/dL (ref 30.0–36.0)
MCV: 86.6 fL (ref 78.0–100.0)
Platelets: 116 10*3/uL — ABNORMAL LOW (ref 150–400)
RBC: 3.81 MIL/uL — ABNORMAL LOW (ref 4.22–5.81)
RDW: 13.9 % (ref 11.5–15.5)
WBC: 5.3 10*3/uL (ref 4.0–10.5)

## 2014-04-06 LAB — GLUCOSE, CAPILLARY
Glucose-Capillary: 185 mg/dL — ABNORMAL HIGH (ref 70–99)
Glucose-Capillary: 197 mg/dL — ABNORMAL HIGH (ref 70–99)
Glucose-Capillary: 279 mg/dL — ABNORMAL HIGH (ref 70–99)
Glucose-Capillary: 334 mg/dL — ABNORMAL HIGH (ref 70–99)

## 2014-04-06 LAB — HEMOGLOBIN A1C
Hgb A1c MFr Bld: 6.9 % — ABNORMAL HIGH (ref <5.7)
Mean Plasma Glucose: 151 mg/dL — ABNORMAL HIGH (ref <117)

## 2014-04-06 LAB — HEPARIN LEVEL (UNFRACTIONATED)
Heparin Unfractionated: 0.45 IU/mL (ref 0.30–0.70)
Heparin Unfractionated: 0.48 IU/mL (ref 0.30–0.70)

## 2014-04-06 MED ORDER — REGADENOSON 0.4 MG/5ML IV SOLN
INTRAVENOUS | Status: AC
  Filled 2014-04-06: qty 5

## 2014-04-06 MED ORDER — REGADENOSON 0.4 MG/5ML IV SOLN
0.4000 mg | Freq: Once | INTRAVENOUS | Status: AC
  Administered 2014-04-06: 0.4 mg via INTRAVENOUS
  Filled 2014-04-06: qty 5

## 2014-04-06 MED ORDER — TECHNETIUM TC 99M SESTAMIBI - CARDIOLITE
30.0000 | Freq: Once | INTRAVENOUS | Status: AC | PRN
  Administered 2014-04-06: 30 via INTRAVENOUS

## 2014-04-06 MED ORDER — TECHNETIUM TC 99M SESTAMIBI - CARDIOLITE
10.0000 | Freq: Once | INTRAVENOUS | Status: AC | PRN
  Administered 2014-04-06: 13:00:00 10 via INTRAVENOUS

## 2014-04-06 NOTE — Progress Notes (Signed)
Lexiscan CL Performed

## 2014-04-06 NOTE — Progress Notes (Signed)
    Subjective:  Chest pain had been coming and going over last several days. No current pain.   Also over the past month has noted spinning sensation when laying down on pillow at night. Has been treated for vertigo in the past by Dr. Inda Merlin. Understands "crystal" manipulation.   Objective:  Vital Signs in the last 24 hours: Temp:  [97.4 F (36.3 C)-98.3 F (36.8 C)] 98.3 F (36.8 C) (07/04 0449) Pulse Rate:  [58-73] 72 (07/04 0449) Resp:  [14-21] 18 (07/04 0449) BP: (129-159)/(58-90) 131/72 mmHg (07/04 0449) SpO2:  [94 %-99 %] 97 % (07/04 0100) Weight:  [198 lb 4.8 oz (89.948 kg)-203 lb 0.7 oz (92.1 kg)] 200 lb (90.719 kg) (07/04 0100)  Intake/Output from previous day:     Physical Exam: General: Well developed, well nourished, in no acute distress. Head:  Normocephalic and atraumatic. Lungs: Clear to auscultation and percussion. Heart: Normal S1 and S2.  No murmur, rubs or gallops.  Abdomen: soft, non-tender, positive bowel sounds. Extremities: No clubbing or cyanosis. No edema. Neurologic: Alert and oriented x 3.    Lab Results:  Recent Labs  04/05/14 1415 04/06/14 0230  WBC 5.0 5.3  HGB 13.3 11.7*  PLT 133* 116*    Recent Labs  04/05/14 1415 04/06/14 0230  NA 146 142  K 3.9 3.5*  CL 104 102  CO2 27 28  GLUCOSE 125* 215*  BUN 21 19  CREATININE 1.01 0.98    Recent Labs  04/06/14 0214 04/06/14 0724  TROPONINI <0.30 <0.30   Imaging: Dg Chest Port 1 View  04/05/2014   CLINICAL DATA:  Chest pain  EXAM: PORTABLE CHEST - 1 VIEW  COMPARISON:  11/15/2011  FINDINGS: Low lung volumes.  Lungs are clear.  No pleural effusion or pneumothorax.  The heart is normal in size.  IMPRESSION: No evidence of acute cardiopulmonary disease.  Low lung volumes.   Electronically Signed   By: Julian Hy M.D.   On: 04/05/2014 16:44   Personally viewed.   Telemetry: No adverse rhythm. Personally viewed.   EKG:  NSR, NSSTW changes.   Cardiac Studies:  Multiple  cardiac caths. Cath in Feb 2013. LM normal. LAD patent LCx mid stent patent; 95% stenosis post stent. RCA patent mid vessel stent with 50 to 60% prox dz. Patient underwent PCI/DES to mid LCx. Aslo underwent DES to mid OM2. And PICI to distal LCx. Small residual dissection in distal vessel (small)  Assessment/Plan:  Active Problems:   Chest pain  1) Chest pain  - somewhat atypical but with his extensive CAD history, I will check a NUC stress test. If high risk..Cath. If low risk, continue with medical mgt.   - stop NTG drip.   2) CAD  - as above.  3) HTN  - stable  SKAINS, MARK 04/06/2014, 10:52 AM

## 2014-04-06 NOTE — Progress Notes (Signed)
ANTICOAGULATION CONSULT NOTE - Follow Up Consult  Pharmacy Consult for heparin Indication: chest pain/ACS  Labs:  Recent Labs  04/05/14 1415 04/05/14 2038 04/06/14 0033  HGB 13.3  --   --   HCT 37.5*  --   --   PLT 133*  --   --   HEPARINUNFRC  --   --  0.48  CREATININE 1.01  --   --   TROPONINI  --  <0.30  --     Assessment/Plan:  72yo male therapeutic on heparin with initial dosing for CP. Will continue gtt at current rate and confirm stable with additional level.   Wynona Neat, PharmD, BCPS  04/06/2014,1:08 AM

## 2014-04-06 NOTE — Progress Notes (Signed)
ANTICOAGULATION CONSULT NOTE - Follow Up Consult  Pharmacy Consult for heparin Indication: chest pain/ACS  Labs:  Recent Labs  04/05/14 1415 04/05/14 2038 04/06/14 0033 04/06/14 0214 04/06/14 0230 04/06/14 0724  HGB 13.3  --   --   --  11.7*  --   HCT 37.5*  --   --   --  33.0*  --   PLT 133*  --   --   --  116*  --   HEPARINUNFRC  --   --  0.48  --   --  0.45  CREATININE 1.01  --   --   --  0.98  --   TROPONINI  --  <0.30  --  <0.30  --  <0.30    Assessment:  73yo male therapeutic on heparin for CP. CEs neg so far. Plan for poss cath per cards.   Plan:  Cont heparin at 1250 units/hr Daily level and CBC

## 2014-04-07 ENCOUNTER — Encounter (HOSPITAL_COMMUNITY): Payer: Self-pay | Admitting: Physician Assistant

## 2014-04-07 DIAGNOSIS — I2510 Atherosclerotic heart disease of native coronary artery without angina pectoris: Secondary | ICD-10-CM | POA: Diagnosis not present

## 2014-04-07 DIAGNOSIS — R079 Chest pain, unspecified: Secondary | ICD-10-CM | POA: Diagnosis not present

## 2014-04-07 DIAGNOSIS — E785 Hyperlipidemia, unspecified: Secondary | ICD-10-CM | POA: Diagnosis not present

## 2014-04-07 DIAGNOSIS — I10 Essential (primary) hypertension: Secondary | ICD-10-CM | POA: Diagnosis not present

## 2014-04-07 LAB — CBC
HCT: 34 % — ABNORMAL LOW (ref 39.0–52.0)
Hemoglobin: 11.9 g/dL — ABNORMAL LOW (ref 13.0–17.0)
MCH: 30.5 pg (ref 26.0–34.0)
MCHC: 35 g/dL (ref 30.0–36.0)
MCV: 87.2 fL (ref 78.0–100.0)
Platelets: 109 10*3/uL — ABNORMAL LOW (ref 150–400)
RBC: 3.9 MIL/uL — ABNORMAL LOW (ref 4.22–5.81)
RDW: 14 % (ref 11.5–15.5)
WBC: 4.7 10*3/uL (ref 4.0–10.5)

## 2014-04-07 LAB — HEPARIN LEVEL (UNFRACTIONATED): Heparin Unfractionated: <0.1 IU/mL — ABNORMAL LOW (ref 0.30–0.70)

## 2014-04-07 LAB — GLUCOSE, CAPILLARY
Glucose-Capillary: 247 mg/dL — ABNORMAL HIGH (ref 70–99)
Glucose-Capillary: 233 mg/dL — ABNORMAL HIGH (ref 70–99)

## 2014-04-07 MED ORDER — ASPIRIN EC 81 MG PO TBEC: 81.0000 mg | DELAYED_RELEASE_TABLET | Freq: Every day | ORAL | Status: DC

## 2014-04-07 NOTE — Discharge Summary (Signed)
Discharge Summary   Patient ID: Keith Espinoza MRN: [id number], DOB/AGE: [id number] y.o. Admit date: 04/05/2014 D/C date:     04/07/2014  Primary Care Provider: Henrine Screws, MD Primary Cardiologist: Irish Lack  Primary Discharge Diagnoses:  1. Precordial chest pain, possibly anginal in nature but ruled out with low risk stress test 2. H/o CAD (stenting hx noted below) 3. HTN  Secondary Discharge Diagnoses:  1. Arthritis   2. Diabetes mellitus Insulin dependent - A1C 6.9 this admission 3. H/O hiatal hernia   4. Chronic lower back pain   5. Chronic leg pain right  6. Gout right wrist; right foot; right elbow; have had it since 1970's  7. Hypertension Diagnosed 1995   8. Vitamin B 12 deficiency orally replaced   9. Diverticulosis   10. History of echocardiogram - aortic sclerosis per echo 12/09 EF 65%, otherwise normal   11. Allergic rhinitis   12. Diabetic neuropathy MILD   13. Gilbert syndrome   14. History of hemorrhoids BLEEDING   15. Allergic rhinitis  16. Intermittent bradycardia, not on BB due to this 17. H/o pancreatic pseudocyst s/p drainage 2006 18. Anemia/thrombocytopenia, appears chronic  Hospital Course: Keith Espinoza is a 73 y/o M with history of DM, HN, CAD (stenting to RCA 2004; staged DES to LAD and Cx 2004, DES to [facility] 2012, DES to mCx, PTCA to dCx 11/2011 then readmission for lateral wall MI 11/2011 s/p PTCA to distal Cx & DES to mid OM2, small residual dissection in distal vessel) who presented to [facility] for chest pain on 04/05/14. The day prior to admission he began having chest pain described as a pressure, 3/10 in intensity, L parasternal area lasting about 10 minutes. It would ease off then come back. It was not pleuric and without associated SOB. He was possibly more tired than usual, denied f/c, n/v, diarrhea, or having to take NTG (did not take because he didn't feel it was severe enough). The day of admission pain came on intermittently prompting  him to seek care in the ED. His initial troponin was negative and EKG showed NSR with nonspecific ST T wave changes. He was started on heparin and NTG in the ER and admitted for further evaluation. CXR was nonacute. Since he ruled out, these were stopped the following morning and the patient underwent stress testing. This demonstrated "Inferolateral scar. No findings for myocardial ischemia. Ejection fraction calculated at 57%." TSH was normal. A1C 6.9.   Note that he is on both amlodipine 5mg  and nisolodipine 20mg  daily and pharmacy verified this regimen - initially we had plans to increase amlodipine to 10mg  daily but given double CCB coverage, we have left this the same. (He was off nisolodipine as inpatient while this was being clarified, and pharmacy also had substituted his Micardis HCT 160/25 with lower dose of HCTZ 12.5mg ). We suspect this is why BP is running slightly higher this AM. We have asked him to monitor BP outside the hospital and call if tending to run >130/80 for possible med adjustment (SBP 734/19-379 systolic during admission). Of note he is not on a beta blocker due to intermittent bradycardia per notes. Also of note, the patient appears to have a mild anemia/thrombocytopenia chronically dating back to 2012. We have dropped his aspirin dose to 81mg  daily given that he is on Plavix - this is the only med change this admission. Today the patient is feeling better. Dr. Marlou Porch has seen and examined the patient today and feels  he is stable for discharge. I have left a message on our office's scheduling voicemail requesting a follow-up appointment, and our office will call the patient with this appointment.  Discharge Vitals: Blood pressure 162/66, pulse 64, temperature 97.4 F (36.3 C), temperature source Oral, resp. rate 16, height 5\' 9"  (1.753 m), weight 196 lb (88.905 kg), SpO2 97.00%. BP prior to med regimen.  Labs: Lab Results  Component Value Date   WBC 4.7 04/07/2014   HGB 11.9*  04/07/2014   HCT 34.0* 04/07/2014   MCV 87.2 04/07/2014   PLT 109* 04/07/2014    Recent Labs Lab 04/06/14 0230  NA 142  K 3.5*  CL 102  CO2 28  BUN 19  CREATININE 0.98  CALCIUM 9.1  GLUCOSE 215*    Recent Labs  04/05/14 2038 04/06/14 0214 04/06/14 0724  TROPONINI <0.30 <0.30 <0.30   Lab Results  Component Value Date   CHOL 117 04/06/2014   HDL 34* 04/06/2014   LDLCALC 58 04/06/2014   TRIG 123 04/06/2014     Diagnostic Studies/Procedures   Nm Myocar Multi W/spect W/wall Motion / Ef 04/06/2014   CLINICAL DATA:  Chest pain.  EXAM: MYOCARDIAL IMAGING WITH SPECT (REST AND PHARMACOLOGIC-STRESS)  GATED LEFT VENTRICULAR WALL MOTION STUDY  LEFT VENTRICULAR EJECTION FRACTION  TECHNIQUE: Standard myocardial SPECT imaging was performed after resting intravenous injection of 10 mCi Tc-65m sestamibi. Subsequently, intravenous infusion of Lexiscan was performed under the supervision of the Cardiology staff. At peak effect of the drug, 30 mCi Tc-23m sestamibi was injected intravenously and standard myocardial SPECT imaging was performed. Quantitative gated imaging was also performed to evaluate left ventricular wall motion, and estimate left ventricular ejection fraction.  COMPARISON:  None.  FINDINGS: The SPECT stress and rest images demonstrate a fixed defect in the inferolateral wall with associated wall motion abnormality and no thickening with contraction. Findings consistent with infarctions/scar. No reversible defects to suggest ischemia. The ejection fraction was calculated at 57%.  IMPRESSION: Inferolateral scar.  No findings for myocardial ischemia.  Ejection fraction calculated at 57%   Electronically Signed   By: Kalman Jewels M.D.   On: 04/06/2014 16:50   Dg Chest Port 1 View 04/05/2014   CLINICAL DATA:  Chest pain  EXAM: PORTABLE CHEST - 1 VIEW  COMPARISON:  11/15/2011  FINDINGS: Low lung volumes.  Lungs are clear.  No pleural effusion or pneumothorax.  The heart is normal in size.  IMPRESSION:  No evidence of acute cardiopulmonary disease.  Low lung volumes.   Electronically Signed   By: Julian Hy M.D.   On: 04/05/2014 16:44    Discharge Medications   Current Discharge Medication List    CONTINUE these medications which have CHANGED   Details  aspirin EC 81 MG tablet Take 1 tablet (81 mg total) by mouth daily. Qty: 30 tablet, Refills: 11      CONTINUE these medications which have NOT CHANGED   Details  acetaminophen (TYLENOL) 325 MG tablet Take 650 mg by mouth every 4 (four) hours as needed for mild pain.     allopurinol (ZYLOPRIM) 300 MG tablet Take 300 mg by mouth daily.     amLODipine (NORVASC) 5 MG tablet Take 5 mg by mouth daily.    clopidogrel (PLAVIX) 75 MG tablet TAKE ONE TABLET BY MOUTH ONE TIME DAILY     gabapentin (NEURONTIN) 300 MG capsule Take 300 mg by mouth 2 (two) times daily.    glimepiride (AMARYL) 4 MG tablet Take 4 mg by  mouth 2 (two) times daily.     insulin detemir (LEVEMIR) 100 UNIT/ML injection Inject 30-50 Units into the skin daily. CBG 150-200 = 30 units, 200-250 = 45 units, CBG > 250 = 50 units    metFORMIN (GLUCOPHAGE) 1000 MG tablet Take 1 tablet (1,000 mg total) by mouth 2 (two) times daily.    nisoldipine (SULAR) 20 MG 24 hr tablet Take 20 mg by mouth every evening.     potassium chloride SA (K-DUR,KLOR-CON) 20 MEQ tablet Take 1 tablet (20 mEq total) by mouth daily.     rosuvastatin (CRESTOR) 10 MG tablet Take 10 mg by mouth See admin instructions. TAKE1 TABLET MON AND FRI    telmisartan-hydrochlorothiazide (MICARDIS HCT) 80-12.5 MG per tablet 2 tablets by mouth daily.    terazosin (HYTRIN) 5 MG capsule Take 5 mg by mouth at bedtime.     nitroGLYCERIN (NITROSTAT) 0.4 MG SL tablet Place 0.4 mg under the tongue every 5 (five) minutes as needed for chest pain.      STOP taking these medications     HYDROcodone-acetaminophen (NORCO) 5-325 MG per tablet - discontinued by pharmacy         Disposition   The patient will  be discharged in stable condition to home. Discharge Instructions   Diet - low sodium heart healthy    Complete by:  As directed      Increase activity slowly    Complete by:  As directed   Please monitor your blood pressure occasionally at home. If you don't have a cuff, you can check it at the grocery store, pharmacy, etc. Call your doctor if you tend to get readings of greater than 130 on the top number or 80 on the bottom number.          Follow-up Information   Follow up with Jettie Booze., MD. (Our office will call you for a follow-up appointment. Please call the office if you have not heard from [place] within 3 days.)    Specialty:  Interventional Cardiology   Contact information:   [address] [place] [address] [phone]         Duration of Discharge Encounter: Greater than 30 minutes including physician and PA time.  Signed, Dayna Dunn PA-C 04/07/2014, 9:22 AM

## 2014-04-07 NOTE — Progress Notes (Signed)
Patient Name: Keith Espinoza Date of Encounter: 04/07/2014  Active Problems:   Chest pain    Patient Profile: 73 yo male w/ hx CAD, last PCI to CFX in 2013, DM, was admitted 07/03 w/ chest pain.   SUBJECTIVE: No more chest pain, no SOB.   OBJECTIVE Filed Vitals:   04/06/14 1406 04/06/14 1657 04/06/14 2100 04/07/14 0500  BP: 158/80 144/65 127/63 162/66  Pulse: 78 60 59 64  Temp:   97.3 F (36.3 C) 97.4 F (36.3 C)  TempSrc:  Oral    Resp:  18 16 16   Height:      Weight:    196 lb (88.905 kg)  SpO2:  99% 96% 97%    Intake/Output Summary (Last 24 hours) at 04/07/14 0732 Last data filed at 04/06/14 1700  Gross per 24 hour  Intake    240 ml  Output      0 ml  Net    240 ml   Filed Weights   04/05/14 2001 04/06/14 0100 04/07/14 0500  Weight: 198 lb 4.8 oz (89.948 kg) 200 lb (90.719 kg) 196 lb (88.905 kg)    PHYSICAL EXAM General: Well developed, well nourished, male in no acute distress. Head: Normocephalic, atraumatic.  Neck: Supple without bruits, JVD not elevated. Lungs:  Resp regular and unlabored, CTA. Heart: RRR, S1, S2, no S3, S4, 2/6 murmur; no rub. Abdomen: Soft, non-tender, non-distended, BS + x 4.  Extremities: No clubbing, cyanosis, no edema.  Neuro: Alert and oriented X 3. Moves all extremities spontaneously. Psych: Normal affect.  LABS: CBC: Recent Labs  04/06/14 0230 04/07/14 0305  WBC 5.3 4.7  HGB 11.7* 11.9*  HCT 33.0* 34.0*  MCV 86.6 87.2  PLT 116* 263*   Basic Metabolic Panel: Recent Labs  04/05/14 1415 04/06/14 0230  NA 146 142  K 3.9 3.5*  CL 104 102  CO2 27 28  GLUCOSE 125* 215*  BUN 21 19  CREATININE 1.01 0.98  CALCIUM 10.2 9.1   Cardiac Enzymes: Recent Labs  04/05/14 2038 04/06/14 0214 04/06/14 0724  TROPONINI <0.30 <0.30 <0.30    Recent Labs  04/05/14 1424  TROPIPOC 0.01   Hemoglobin A1C: Recent Labs  04/05/14 2038  HGBA1C 6.9*   Fasting Lipid Panel: Recent Labs  04/06/14 0230  CHOL 117  HDL  34*  LDLCALC 58  TRIG 123  CHOLHDL 3.4   Thyroid Function Tests: Recent Labs  04/05/14 2038  TSH 1.160   TELE:        Radiology/Studies: Nm Myocar Multi W/spect W/wall Motion / Ef 04/06/2014   CLINICAL DATA:  Chest pain.  EXAM: MYOCARDIAL IMAGING WITH SPECT (REST AND PHARMACOLOGIC-STRESS)  GATED LEFT VENTRICULAR WALL MOTION STUDY  LEFT VENTRICULAR EJECTION FRACTION  TECHNIQUE: Standard myocardial SPECT imaging was performed after resting intravenous injection of 10 mCi Tc-68m sestamibi. Subsequently, intravenous infusion of Lexiscan was performed under the supervision of the Cardiology staff. At peak effect of the drug, 30 mCi Tc-66m sestamibi was injected intravenously and standard myocardial SPECT imaging was performed. Quantitative gated imaging was also performed to evaluate left ventricular wall motion, and estimate left ventricular ejection fraction.  COMPARISON:  None.  FINDINGS: The SPECT stress and rest images demonstrate a fixed defect in the inferolateral wall with associated wall motion abnormality and no thickening with contraction. Findings consistent with infarctions/scar. No reversible defects to suggest ischemia. The ejection fraction was calculated at 57%.  IMPRESSION: Inferolateral scar.  No findings for myocardial ischemia.  Ejection  fraction calculated at 57%   Electronically Signed   By: Kalman Jewels M.D.   On: 04/06/2014 16:50   Dg Chest Port 1 View 04/05/2014   CLINICAL DATA:  Chest pain  EXAM: PORTABLE CHEST - 1 VIEW  COMPARISON:  11/15/2011  FINDINGS: Low lung volumes.  Lungs are clear.  No pleural effusion or pneumothorax.  The heart is normal in size.  IMPRESSION: No evidence of acute cardiopulmonary disease.  Low lung volumes.   Electronically Signed   By: Julian Hy M.D.   On: 04/05/2014 16:44     Current Medications:  . allopurinol  300 mg Oral Daily  . amLODipine  5 mg Oral Daily  . aspirin EC  325 mg Oral Daily  . clopidogrel  75 mg Oral Q breakfast    . gabapentin  300 mg Oral BID  . irbesartan  300 mg Oral Daily   And  . hydrochlorothiazide  12.5 mg Oral Daily  . insulin aspart  0-9 Units Subcutaneous TID WC  . potassium chloride SA  20 mEq Oral Daily  . rosuvastatin  10 mg Oral q1800  . sodium chloride  3 mL Intravenous Q12H  . terazosin  5 mg Oral QHS      ASSESSMENT AND PLAN: Active Problems:   Chest pain - hx CAD but ez neg MI and MV neg ischemia. On ASA, Plavix, statin. Not on BB due to intermittent bradycardia.     HTN - SBP 120s-150s on current rx. MD advise on increasing amlodipine.   Plan - d/c today.  Signed, Rosaria Ferries , PA-C 7:32 AM 04/07/2014  Personally seen and examined. Agree with above. Feeling better this morning. Nuclear stress test demonstrating inferolateral scar with no ischemia, EF 57%. Low risk.  Hypertension-agree on increasing amlodipine to 10 mg once a day. This may cause increased lower extremity edema however. Continue to monitor as outpatient. This could also help with anginal symptoms.  Okay with discharge.  Followup with Dr. Irish Lack or APP in 2 weeks. Candee Furbish, MD

## 2014-04-07 NOTE — Discharge Summary (Signed)
Personally seen and examined. Agree with above. Reassuring NUC stress, no ischemia. Old infarct pattern noted. Normal ejection fraction. Continue with medical management. Candee Furbish, MD

## 2014-04-10 DIAGNOSIS — I69998 Other sequelae following unspecified cerebrovascular disease: Secondary | ICD-10-CM | POA: Diagnosis not present

## 2014-04-24 ENCOUNTER — Ambulatory Visit (INDEPENDENT_AMBULATORY_CARE_PROVIDER_SITE_OTHER): Payer: Medicare Other | Admitting: Physician Assistant

## 2014-04-24 ENCOUNTER — Encounter: Payer: Self-pay | Admitting: Physician Assistant

## 2014-04-24 VITALS — BP 130/74 | HR 65 | Ht 69.0 in | Wt 197.0 lb

## 2014-04-24 DIAGNOSIS — E785 Hyperlipidemia, unspecified: Secondary | ICD-10-CM | POA: Diagnosis not present

## 2014-04-24 DIAGNOSIS — I10 Essential (primary) hypertension: Secondary | ICD-10-CM | POA: Diagnosis not present

## 2014-04-24 DIAGNOSIS — I2510 Atherosclerotic heart disease of native coronary artery without angina pectoris: Secondary | ICD-10-CM

## 2014-04-24 NOTE — Patient Instructions (Signed)
Your physician recommends that you continue on your current medications as directed. Please refer to the Current Medication list given to you today.  Your physician wants you to follow-up in: 6 months with Dr. Varanasi.  You will receive a reminder letter in the mail two months in advance. If you don't receive a letter, please call our office to schedule the follow-up appointment.  

## 2014-04-24 NOTE — Progress Notes (Signed)
Cardiology Office Note    Date:  04/24/2014   ID:  Deion, Swift 09-26-1941, MRN [id number]  PCP:  Henrine Screws, MD  Cardiologist:  Dr. Casandra Doffing      History of Present Illness: Keith Espinoza is a 73 y.o. male with a history of CAD status post multiple PCI procedures in the past, HTN, diabetes, bradycardia. He was admitted 7/3-7/5 with chest pain. Cardiac enzymes remained normal. He underwent inpatient Myoview which was low risk. No further workup was planned. He returns for followup.  The patient denies any chest pain, significant dyspnea, syncope, orthopnea, PND, edema.   Studies:  - LHC (11/2011):  Mid LAD stent patent, mid circumflex stents patent with 25% proximal stented area, distal OM to 70%, distal circumflex occluded, proximal RCA 50-60%, mid RCA stent patent >>> PCI:  Promus DES to the OM2, balloon angioplasty to distal circumflex  - Nuclear (04/06/14):  Inferolateral scar, no ischemia, EF 57%   Recent Labs: 04/05/2014: TSH 1.160  04/06/2014: Creatinine 0.98; HDL Cholesterol by NMR 34*; LDL (calc) 58; Potassium 3.5*  04/07/2014: Hemoglobin 11.9*   Wt Readings from Last 3 Encounters:  04/24/14 197 lb (89.359 kg)  04/07/14 196 lb (88.905 kg)  11/07/13 203 lb (92.08 kg)     Past Medical History  Diagnosis Date  . Heart murmur   . Coronary artery disease     a. Stenting to RCA 2004; staged DES to LAD and Cx 2004. DES to mRCA 2012. b. DES to mCx, PTCA to dCx 11/2011. c. Lateral wall MI 2013 s/p PTCA to distal Cx & DES to mid OM2 11/2011. d. Low risk nuc 04/2014, EF wnl.  . Arthritis   . Diabetes mellitus     Insulin dependent  . Dysrhythmia   . H/O hiatal hernia   . Chronic lower back pain   . Chronic leg pain     right  . Gout     right wrist; right foot; right elbow; have had it since 1970's  . Hypertension     Diagnosed 1995   . Vitamin B 12 deficiency     orally replaced  . Diverticulosis   . History of echocardiogram     aortic sclerosis per  echo 12/09 EF 65%, otherwise normal  . Allergic rhinitis   . Diabetic neuropathy     MILD  . Gilbert syndrome   . History of hemorrhoids     BLEEDING  . Allergic rhinitis   . Pancreatic pseudocyst     a. s/p remote drainage 2006.    Current Outpatient Prescriptions  Medication Sig Dispense Refill  . acetaminophen (TYLENOL) 325 MG tablet Take 650 mg by mouth every 4 (four) hours as needed for mild pain.       Marland Kitchen allopurinol (ZYLOPRIM) 300 MG tablet Take 300 mg by mouth daily.       Marland Kitchen amLODipine (NORVASC) 5 MG tablet Take 5 mg by mouth daily.      Marland Kitchen aspirin EC 81 MG tablet Take 1 tablet (81 mg total) by mouth daily.  30 tablet  11  . clopidogrel (PLAVIX) 75 MG tablet TAKE ONE TABLET BY MOUTH ONE TIME DAILY  30 tablet  5  . gabapentin (NEURONTIN) 300 MG capsule Take 300 mg by mouth 2 (two) times daily.      Marland Kitchen glimepiride (AMARYL) 4 MG tablet Take 4 mg by mouth 2 (two) times daily.       . insulin detemir (LEVEMIR) 100 UNIT/ML  injection Inject 30-50 Units into the skin daily. CBG 150-200 = 30 units, 200-250 = 45 units, CBG > 250 = 50 units      . metFORMIN (GLUCOPHAGE) 1000 MG tablet Take 1 tablet (1,000 mg total) by mouth 2 (two) times daily.      . nitroGLYCERIN (NITROSTAT) 0.4 MG SL tablet Place 0.4 mg under the tongue every 5 (five) minutes as needed for chest pain.      . potassium chloride SA (K-DUR,KLOR-CON) 20 MEQ tablet Take 1 tablet (20 mEq total) by mouth daily.  30 tablet  3  . rosuvastatin (CRESTOR) 10 MG tablet Take 10 mg by mouth See admin instructions. TAKE1 TABLET MON AND FRI      . telmisartan-hydrochlorothiazide (MICARDIS HCT) 80-12.5 MG per tablet 2 tablets by mouth daily.      Marland Kitchen terazosin (HYTRIN) 5 MG capsule Take 5 mg by mouth at bedtime.        No current facility-administered medications for this visit.    Allergies:   Fish oil; Simvastatin; and Zetia   Social History:  The patient  reports that he quit smoking about 40 years ago. His smoking use included  Cigarettes. He has a 1.5 pack-year smoking history. His smokeless tobacco use includes Chew. He reports that he does not drink alcohol or use illicit drugs.   Family History:  The patient's family history includes Cancer in his brother and father; Diabetes in his mother and sister; Hyperlipidemia in his mother; Hypertension in his father, mother, and sister.   ROS:  Please see the history of present illness.      All other systems reviewed and negative.   PHYSICAL EXAM: VS:  BP 130/74  Pulse 65  Ht 5\' 9"  (1.753 m)  Wt 197 lb (89.359 kg)  BMI 29.08 kg/m2 Well nourished, well developed, in no acute distress HEENT: normal Neck: no JVD Cardiac:  normal S1, S2; RRR; no murmur Lungs:  clear to auscultation bilaterally, no wheezing, rhonchi or rales Abd: soft, nontender, no hepatomegaly Ext: no edema Skin: warm and dry Neuro:  CNs 2-12 intact, no focal abnormalities noted  EKG:  NSR, HR 65, no ST changes     ASSESSMENT AND PLAN:  Atherosclerosis of native coronary artery of native heart without angina pectoris Recent Myoview low risk. He denies any symptoms of angina. Continue aspirin, Plavix, statin. He is not on a beta blocker due to history of bradycardia.  Essential hypertension, benign Controlled.  Other and unspecified hyperlipidemia   Continue statin. Labs are followed by primary care.  Disposition:  Followup with Dr. Irish Lack in 6 mos.  Signed, Versie Starks, MHS 04/24/2014 3:14 PM    [facility] [place], [place], [place]  [address] Phone: [phone]; Fax: [phone]

## 2014-05-03 ENCOUNTER — Other Ambulatory Visit: Payer: Self-pay

## 2014-05-03 MED ORDER — POTASSIUM CHLORIDE CRYS ER 20 MEQ PO TBCR: 20.0000 meq | EXTENDED_RELEASE_TABLET | Freq: Every day | ORAL | Status: DC

## 2014-05-06 DIAGNOSIS — I10 Essential (primary) hypertension: Secondary | ICD-10-CM | POA: Diagnosis not present

## 2014-05-06 DIAGNOSIS — I2510 Atherosclerotic heart disease of native coronary artery without angina pectoris: Secondary | ICD-10-CM | POA: Diagnosis not present

## 2014-05-21 DIAGNOSIS — M1610 Unilateral primary osteoarthritis, unspecified hip: Secondary | ICD-10-CM | POA: Diagnosis not present

## 2014-05-21 DIAGNOSIS — M47817 Spondylosis without myelopathy or radiculopathy, lumbosacral region: Secondary | ICD-10-CM | POA: Diagnosis not present

## 2014-05-21 DIAGNOSIS — M169 Osteoarthritis of hip, unspecified: Secondary | ICD-10-CM | POA: Diagnosis not present

## 2014-05-21 DIAGNOSIS — M25569 Pain in unspecified knee: Secondary | ICD-10-CM | POA: Diagnosis not present

## 2014-05-21 DIAGNOSIS — M25559 Pain in unspecified hip: Secondary | ICD-10-CM | POA: Diagnosis not present

## 2014-06-06 ENCOUNTER — Ambulatory Visit: Payer: Medicare Other | Admitting: Interventional Cardiology

## 2014-07-15 DIAGNOSIS — E559 Vitamin D deficiency, unspecified: Secondary | ICD-10-CM | POA: Diagnosis not present

## 2014-07-15 DIAGNOSIS — N400 Enlarged prostate without lower urinary tract symptoms: Secondary | ICD-10-CM | POA: Diagnosis not present

## 2014-07-15 DIAGNOSIS — I25118 Atherosclerotic heart disease of native coronary artery with other forms of angina pectoris: Secondary | ICD-10-CM | POA: Diagnosis not present

## 2014-07-15 DIAGNOSIS — Z23 Encounter for immunization: Secondary | ICD-10-CM | POA: Diagnosis not present

## 2014-07-15 DIAGNOSIS — I10 Essential (primary) hypertension: Secondary | ICD-10-CM | POA: Diagnosis not present

## 2014-07-15 DIAGNOSIS — M109 Gout, unspecified: Secondary | ICD-10-CM | POA: Diagnosis not present

## 2014-07-15 DIAGNOSIS — K219 Gastro-esophageal reflux disease without esophagitis: Secondary | ICD-10-CM | POA: Diagnosis not present

## 2014-07-15 DIAGNOSIS — E1140 Type 2 diabetes mellitus with diabetic neuropathy, unspecified: Secondary | ICD-10-CM | POA: Diagnosis not present

## 2014-07-15 DIAGNOSIS — E784 Other hyperlipidemia: Secondary | ICD-10-CM | POA: Diagnosis not present

## 2014-07-22 DIAGNOSIS — M545 Low back pain: Secondary | ICD-10-CM | POA: Diagnosis not present

## 2014-07-23 DIAGNOSIS — S134XXA Sprain of ligaments of cervical spine, initial encounter: Secondary | ICD-10-CM | POA: Diagnosis not present

## 2014-07-23 DIAGNOSIS — R42 Dizziness and giddiness: Secondary | ICD-10-CM | POA: Diagnosis not present

## 2014-07-23 DIAGNOSIS — S0990XA Unspecified injury of head, initial encounter: Secondary | ICD-10-CM | POA: Diagnosis not present

## 2014-07-23 DIAGNOSIS — M47812 Spondylosis without myelopathy or radiculopathy, cervical region: Secondary | ICD-10-CM | POA: Diagnosis not present

## 2014-07-23 DIAGNOSIS — S161XXA Strain of muscle, fascia and tendon at neck level, initial encounter: Secondary | ICD-10-CM | POA: Diagnosis not present

## 2014-07-23 DIAGNOSIS — M5032 Other cervical disc degeneration, mid-cervical region: Secondary | ICD-10-CM | POA: Diagnosis not present

## 2014-07-30 DIAGNOSIS — R51 Headache: Secondary | ICD-10-CM | POA: Diagnosis not present

## 2014-07-30 DIAGNOSIS — M542 Cervicalgia: Secondary | ICD-10-CM | POA: Diagnosis not present

## 2014-08-08 DIAGNOSIS — E119 Type 2 diabetes mellitus without complications: Secondary | ICD-10-CM | POA: Diagnosis not present

## 2014-08-08 DIAGNOSIS — Z961 Presence of intraocular lens: Secondary | ICD-10-CM | POA: Diagnosis not present

## 2014-08-08 DIAGNOSIS — Z794 Long term (current) use of insulin: Secondary | ICD-10-CM | POA: Diagnosis not present

## 2014-08-15 DIAGNOSIS — M542 Cervicalgia: Secondary | ICD-10-CM | POA: Diagnosis not present

## 2014-08-19 ENCOUNTER — Ambulatory Visit: Payer: Medicare Other | Attending: Orthopaedic Surgery | Admitting: Physical Therapy

## 2014-08-19 DIAGNOSIS — M4312 Spondylolisthesis, cervical region: Secondary | ICD-10-CM | POA: Diagnosis not present

## 2014-08-19 DIAGNOSIS — M479 Spondylosis, unspecified: Secondary | ICD-10-CM | POA: Diagnosis not present

## 2014-08-19 DIAGNOSIS — E119 Type 2 diabetes mellitus without complications: Secondary | ICD-10-CM | POA: Insufficient documentation

## 2014-08-19 DIAGNOSIS — M542 Cervicalgia: Secondary | ICD-10-CM | POA: Insufficient documentation

## 2014-08-19 DIAGNOSIS — M545 Low back pain: Secondary | ICD-10-CM | POA: Diagnosis not present

## 2014-08-19 DIAGNOSIS — M47816 Spondylosis without myelopathy or radiculopathy, lumbar region: Secondary | ICD-10-CM | POA: Diagnosis not present

## 2014-08-19 DIAGNOSIS — I10 Essential (primary) hypertension: Secondary | ICD-10-CM | POA: Diagnosis not present

## 2014-08-22 ENCOUNTER — Ambulatory Visit: Payer: Medicare Other | Admitting: Physical Therapy

## 2014-08-22 DIAGNOSIS — M47816 Spondylosis without myelopathy or radiculopathy, lumbar region: Secondary | ICD-10-CM | POA: Diagnosis not present

## 2014-08-26 ENCOUNTER — Ambulatory Visit: Payer: Medicare Other | Admitting: *Deleted

## 2014-08-26 DIAGNOSIS — M47816 Spondylosis without myelopathy or radiculopathy, lumbar region: Secondary | ICD-10-CM | POA: Diagnosis not present

## 2014-08-30 ENCOUNTER — Telehealth: Payer: Self-pay | Admitting: Interventional Cardiology

## 2014-08-30 ENCOUNTER — Encounter (HOSPITAL_COMMUNITY): Payer: Self-pay | Admitting: Emergency Medicine

## 2014-08-30 ENCOUNTER — Emergency Department (HOSPITAL_COMMUNITY): Payer: Medicare Other

## 2014-08-30 ENCOUNTER — Emergency Department (HOSPITAL_COMMUNITY)
Admission: EM | Admit: 2014-08-30 | Discharge: 2014-08-30 | Disposition: A | Discharge status: Home / Self Care | Payer: Medicare Other | Attending: Emergency Medicine | Admitting: Emergency Medicine

## 2014-08-30 DIAGNOSIS — Z8719 Personal history of other diseases of the digestive system: Secondary | ICD-10-CM | POA: Diagnosis not present

## 2014-08-30 DIAGNOSIS — R079 Chest pain, unspecified: Secondary | ICD-10-CM | POA: Diagnosis not present

## 2014-08-30 DIAGNOSIS — D696 Thrombocytopenia, unspecified: Secondary | ICD-10-CM | POA: Diagnosis present

## 2014-08-30 DIAGNOSIS — M1990 Unspecified osteoarthritis, unspecified site: Secondary | ICD-10-CM | POA: Insufficient documentation

## 2014-08-30 DIAGNOSIS — Z79899 Other long term (current) drug therapy: Secondary | ICD-10-CM | POA: Diagnosis not present

## 2014-08-30 DIAGNOSIS — Z9861 Coronary angioplasty status: Secondary | ICD-10-CM | POA: Diagnosis not present

## 2014-08-30 DIAGNOSIS — E1140 Type 2 diabetes mellitus with diabetic neuropathy, unspecified: Secondary | ICD-10-CM | POA: Diagnosis not present

## 2014-08-30 DIAGNOSIS — Z862 Personal history of diseases of the blood and blood-forming organs and certain disorders involving the immune mechanism: Secondary | ICD-10-CM | POA: Insufficient documentation

## 2014-08-30 DIAGNOSIS — G8929 Other chronic pain: Secondary | ICD-10-CM | POA: Diagnosis not present

## 2014-08-30 DIAGNOSIS — I2510 Atherosclerotic heart disease of native coronary artery without angina pectoris: Secondary | ICD-10-CM | POA: Diagnosis not present

## 2014-08-30 DIAGNOSIS — K219 Gastro-esophageal reflux disease without esophagitis: Secondary | ICD-10-CM | POA: Diagnosis present

## 2014-08-30 DIAGNOSIS — Z7982 Long term (current) use of aspirin: Secondary | ICD-10-CM | POA: Diagnosis not present

## 2014-08-30 DIAGNOSIS — Z7902 Long term (current) use of antithrombotics/antiplatelets: Secondary | ICD-10-CM | POA: Insufficient documentation

## 2014-08-30 DIAGNOSIS — R011 Cardiac murmur, unspecified: Secondary | ICD-10-CM | POA: Diagnosis not present

## 2014-08-30 DIAGNOSIS — R072 Precordial pain: Secondary | ICD-10-CM | POA: Diagnosis present

## 2014-08-30 DIAGNOSIS — E119 Type 2 diabetes mellitus without complications: Secondary | ICD-10-CM

## 2014-08-30 DIAGNOSIS — I10 Essential (primary) hypertension: Secondary | ICD-10-CM | POA: Diagnosis present

## 2014-08-30 DIAGNOSIS — E118 Type 2 diabetes mellitus with unspecified complications: Secondary | ICD-10-CM

## 2014-08-30 DIAGNOSIS — Z87891 Personal history of nicotine dependence: Secondary | ICD-10-CM | POA: Diagnosis not present

## 2014-08-30 DIAGNOSIS — Z794 Long term (current) use of insulin: Secondary | ICD-10-CM

## 2014-08-30 DIAGNOSIS — R51 Headache: Secondary | ICD-10-CM | POA: Diagnosis not present

## 2014-08-30 HISTORY — DX: Thrombocytopenia, unspecified: D69.6

## 2014-08-30 LAB — CBC WITH DIFFERENTIAL/PLATELET
Basophils Absolute: 0 10*3/uL (ref 0.0–0.1)
Basophils Relative: 1 % (ref 0–1)
Eosinophils Absolute: 0.3 10*3/uL (ref 0.0–0.7)
Eosinophils Relative: 5 % (ref 0–5)
HCT: 39.1 % (ref 39.0–52.0)
Hemoglobin: 14.2 g/dL (ref 13.0–17.0)
Lymphocytes Relative: 32 % (ref 12–46)
Lymphs Abs: 1.5 10*3/uL (ref 0.7–4.0)
MCH: 32.4 pg (ref 26.0–34.0)
MCHC: 36.3 g/dL — ABNORMAL HIGH (ref 30.0–36.0)
MCV: 89.3 fL (ref 78.0–100.0)
Monocytes Absolute: 0.3 10*3/uL (ref 0.1–1.0)
Monocytes Relative: 7 % (ref 3–12)
Neutro Abs: 2.6 10*3/uL (ref 1.7–7.7)
Neutrophils Relative %: 55 % (ref 43–77)
Platelets: 125 10*3/uL — ABNORMAL LOW (ref 150–400)
RBC: 4.38 MIL/uL (ref 4.22–5.81)
RDW: 13.8 % (ref 11.5–15.5)
WBC: 4.6 10*3/uL (ref 4.0–10.5)

## 2014-08-30 LAB — BASIC METABOLIC PANEL
Anion gap: 13 (ref 5–15)
BUN: 13 mg/dL (ref 6–23)
CO2: 27 mEq/L (ref 19–32)
Calcium: 10 mg/dL (ref 8.4–10.5)
Chloride: 103 mEq/L (ref 96–112)
Creatinine, Ser: 1.01 mg/dL (ref 0.50–1.35)
GFR calc Af Amer: 82 mL/min — ABNORMAL LOW (ref >90)
GFR calc non Af Amer: 71 mL/min — ABNORMAL LOW (ref >90)
Glucose, Bld: 87 mg/dL (ref 70–99)
Potassium: 3.7 mEq/L (ref 3.7–5.3)
Sodium: 143 mEq/L (ref 137–147)

## 2014-08-30 LAB — I-STAT TROPONIN, ED: Troponin i, poc: 0 ng/mL (ref 0.00–0.08)

## 2014-08-30 LAB — PRO B NATRIURETIC PEPTIDE: Pro B Natriuretic peptide (BNP): 35.3 pg/mL (ref 0–125)

## 2014-08-30 LAB — TROPONIN I: Troponin I: <0.3 ng/mL (ref <0.30)

## 2014-08-30 MED ORDER — ASPIRIN 81 MG PO CHEW
324.0000 mg | CHEWABLE_TABLET | Freq: Once | ORAL | Status: AC
  Administered 2014-08-30: 324 mg via ORAL
  Filled 2014-08-30: qty 4

## 2014-08-30 MED ORDER — NITROGLYCERIN 0.4 MG SL SUBL
0.4000 mg | SUBLINGUAL_TABLET | SUBLINGUAL | Status: DC | PRN
  Administered 2014-08-30: 0.4 mg via SUBLINGUAL
  Filled 2014-08-30: qty 1

## 2014-08-30 MED ORDER — ISOSORBIDE MONONITRATE ER 30 MG PO TB24: 30.0000 mg | ORAL_TABLET | Freq: Every day | ORAL | Status: DC

## 2014-08-30 MED ORDER — ISOSORBIDE MONONITRATE ER 30 MG PO TB24
30.0000 mg | ORAL_TABLET | Freq: Every day | ORAL | Status: DC
  Administered 2014-08-30: 30 mg via ORAL
  Filled 2014-08-30: qty 1

## 2014-08-30 NOTE — Telephone Encounter (Signed)
Have had some chest pains . Didn't have any this morning , took Nitro the day before yesterday and took two on yesterday .  Not feeling well at all and is going to the ER.  Have been hurting on the top of head on the left side . Please call    Thanks

## 2014-08-30 NOTE — Telephone Encounter (Signed)
I spoke with patient about his recurring chest pain. States he took 1 sl NTG Tuesday with relief. Took 2 SL NTG yesterday with relief.  He states he also had a tingling in the top of his head & the NTG relieved that as well. Today he is having dull chest pain.   Pt states that he feels like he needs a heart catheterization "I've had 6 stents & they need to do another heart cath to find out "  Denies: dizziness, no weight gain, no shortness of breath. His daughter is taking him to the ED [place] notified & message sent to Dr. Lenon Curt RN
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
## 2014-08-30 NOTE — ED Notes (Signed)
The patient started having chest pain the day before yesterday so he took one Nitro and it went away.  He said it started again yesterday and he took two nitros and it went away.  Today he is not feeling well and the chest pain is back.  He denies SOB, N/V, dizziness, lightheadedness or any other symptoms.  He has not taken any nitros today.

## 2014-08-30 NOTE — Discharge Instructions (Signed)
We saw you in the ER for the chest pain/shortness of breath. °All of our cardiac workup is normal, including labs, EKG and chest X-RAY are normal. °We are not sure what is causing your discomfort, but we feel comfortable sending you home at this time. The workup in the ER is not complete, and you should follow up with your primary care doctor for further evaluation. ° ° °Chest Pain (Nonspecific) °It is often hard to give a specific diagnosis for the cause of chest pain. There is always a chance that your pain could be related to something serious, such as a heart attack or a blood clot in the lungs. You need to follow up with your health care provider for further evaluation. °CAUSES  °· Heartburn. °· Pneumonia or bronchitis. °· Anxiety or stress. °· Inflammation around your heart (pericarditis) or lung (pleuritis or pleurisy). °· A blood clot in the lung. °· A collapsed lung (pneumothorax). It can develop suddenly on its own (spontaneous pneumothorax) or from trauma to the chest. °· Shingles infection (herpes zoster virus). °The chest wall is composed of bones, muscles, and cartilage. Any of these can be the source of the pain. °· The bones can be bruised by injury. °· The muscles or cartilage can be strained by coughing or overwork. °· The cartilage can be affected by inflammation and become sore (costochondritis). °DIAGNOSIS  °Lab tests or other studies may be needed to find the cause of your pain. Your health care provider may have you take a test called an ambulatory electrocardiogram (ECG). An ECG records your heartbeat patterns over a 24-hour period. You may also have other tests, such as: °· Transthoracic echocardiogram (TTE). During echocardiography, sound waves are used to evaluate how blood flows through your heart. °· Transesophageal echocardiogram (TEE). °· Cardiac monitoring. This allows your health care provider to monitor your heart rate and rhythm in real time. °· Holter monitor. This is a portable  device that records your heartbeat and can help diagnose heart arrhythmias. It allows your health care provider to track your heart activity for several days, if needed. °· Stress tests by exercise or by giving medicine that makes the heart beat faster. °TREATMENT  °· Treatment depends on what may be causing your chest pain. Treatment may include: °¨ Acid blockers for heartburn. °¨ Anti-inflammatory medicine. °¨ Pain medicine for inflammatory conditions. °¨ Antibiotics if an infection is present. °· You may be advised to change lifestyle habits. This includes stopping smoking and avoiding alcohol, caffeine, and chocolate. °· You may be advised to keep your head raised (elevated) when sleeping. This reduces the chance of acid going backward from your stomach into your esophagus. °Most of the time, nonspecific chest pain will improve within 2-3 days with rest and mild pain medicine.  °HOME CARE INSTRUCTIONS  °· If antibiotics were prescribed, take them as directed. Finish them even if you start to feel better. °· For the next few days, avoid physical activities that bring on chest pain. Continue physical activities as directed. °· Do not use any tobacco products, including cigarettes, chewing tobacco, or electronic cigarettes. °· Avoid drinking alcohol. °· Only take medicine as directed by your health care provider. °· Follow your health care provider's suggestions for further testing if your chest pain does not go away. °· Keep any follow-up appointments you made. If you do not go to an appointment, you could develop lasting (chronic) problems with pain. If there is any problem keeping an appointment, call to reschedule. °SEEK   MEDICAL CARE IF:  °· Your chest pain does not go away, even after treatment. °· You have a rash with blisters on your chest. °· You have a fever. °SEEK IMMEDIATE MEDICAL CARE IF:  °· You have increased chest pain or pain that spreads to your arm, neck, jaw, back, or abdomen. °· You have  shortness of breath. °· You have an increasing cough, or you cough up blood. °· You have severe back or abdominal pain. °· You feel nauseous or vomit. °· You have severe weakness. °· You faint. °· You have chills. °This is an emergency. Do not wait to see if the pain will go away. Get medical help at once. Call your local emergency services (911 in [place] Do not drive yourself to the hospital. °MAKE SURE YOU:  °· Understand these instructions. °· Will watch your condition. °· Will get help right away if you are not doing well or get worse. °Document Released: 06/30/2005 Document Revised: 09/25/2013 Document Reviewed: 04/25/2008 °[employer]® Patient Information ©2015 [employer]. This information is not intended to replace advice given to you by your health care provider. Make sure you discuss any questions you have with your health care provider. ° °

## 2014-08-30 NOTE — ED Provider Notes (Signed)
CSN: [id number]     Arrival date & time 08/30/14  1220 History   First MD Initiated Contact with Patient 08/30/14 1341     Chief Complaint  Patient presents with  . Chest Pain    The patient started having chest pain the day before yesterday so he took one Nitro and it went away.  He said it started again yesterday and he took two nitros and it went away.  Today he is not feeling well and the chest pain is back.     (Consider location/radiation/quality/duration/timing/severity/associated sxs/prior Treatment) HPI Comments: Patient presents with chest pain. He has a extensive history including diabetes mellitus hypertension coronary disease status post stents in 2004 in 2013. His last CT scan was in July 2015 which showed an area of scarring but no ischemia. He comes in today with chest pain. He's been having a three-day history of intermittent chest pain. The last 2 days he's taken his nitroglycerin and it has gone away. Today he has felt unwell all day. He's had some tightness across his chest as well as a left-sided headache. He says been intermittent through the day. He denies any shortness of breath nausea vomiting or diaphoresis. He says is similar to his past anginal type symptoms. He denies any leg swelling. He denies a cough or chest congestion. It does not seem to be exertional in nature.  Patient is a 73 y.o. male presenting with chest pain.  Chest Pain Associated symptoms: headache   Associated symptoms: no abdominal pain, no back pain, no cough, no diaphoresis, no dizziness, no fatigue, no fever, no nausea, no numbness, no shortness of breath, not vomiting and no weakness     Past Medical History  Diagnosis Date  . Heart murmur   . Coronary artery disease     a. Stenting to RCA 2004; staged DES to LAD and Cx 2004. DES to mRCA 2012. b. DES to mCx, PTCA to dCx 11/2011. c. Lateral wall MI 2013 s/p PTCA to distal Cx & DES to mid OM2 11/2011. d. Low risk nuc 04/2014, EF wnl.  . Arthritis    . Diabetes mellitus     Insulin dependent  . Dysrhythmia   . H/O hiatal hernia   . Chronic lower back pain   . Chronic leg pain     right  . Gout     right wrist; right foot; right elbow; have had it since 1970's  . Hypertension     Diagnosed 1995   . Vitamin B 12 deficiency     orally replaced  . Diverticulosis   . History of echocardiogram     aortic sclerosis per echo 12/09 EF 65%, otherwise normal  . Allergic rhinitis   . Diabetic neuropathy     MILD  . Gilbert syndrome   . History of hemorrhoids     BLEEDING  . Allergic rhinitis   . Pancreatic pseudocyst     a. s/p remote drainage 2006.   Past Surgical History  Procedure Laterality Date  . Knee arthroscopy  1990's    left  . Joint replacement  04/03/2002    right hip replacment  . Tonsillectomy  ~ 1948  . Cholecystectomy  1990's  . Inguinal hernia repair  2003    right  . Back surgery      "total of 3 times" S/P fall   . Coronary angioplasty with stent placement  09/30/2011    "1 then; makes a total of 4"  . Coronary  angioplasty with stent placement  11/11/11    "1; makes a total of 5"  . Coronary angioplasty  11/11/11   Family History  Problem Relation Age of Onset  . Cancer Father   . Diabetes Mother   . Diabetes Sister   . Hyperlipidemia Mother   . Hypertension Mother   . Hypertension Father   . Hypertension Sister   . Cancer Brother    History  Substance Use Topics  . Smoking status: Former Smoker -- 0.50 packs/day for 3 years    Types: Cigarettes    Quit date: 11/11/1973  . Smokeless tobacco: Current User    Types: Chew     Comment: quit smoking cigarettes 1970's  . Alcohol Use: No    Review of Systems  Constitutional: Negative for fever, chills, diaphoresis and fatigue.  HENT: Negative for congestion, rhinorrhea and sneezing.   Eyes: Negative.   Respiratory: Negative for cough, chest tightness and shortness of breath.   Cardiovascular: Positive for chest pain. Negative for leg swelling.   Gastrointestinal: Negative for nausea, vomiting, abdominal pain, diarrhea and blood in stool.  Genitourinary: Negative for frequency, hematuria, flank pain and difficulty urinating.  Musculoskeletal: Negative for back pain and arthralgias.  Skin: Negative for rash.  Neurological: Positive for headaches. Negative for dizziness, speech difficulty, weakness and numbness.      Allergies  Fish oil; Simvastatin; and Zetia  Home Medications   Prior to Admission medications   Medication Sig Start Date End Date Taking? Authorizing Provider  acetaminophen (TYLENOL) 325 MG tablet Take 650 mg by mouth every 4 (four) hours as needed for mild pain.    Yes Historical Provider, MD  allopurinol (ZYLOPRIM) 300 MG tablet Take 300 mg by mouth daily.    Yes Historical Provider, MD  amLODipine (NORVASC) 5 MG tablet Take 5 mg by mouth daily.   Yes Historical Provider, MD  aspirin EC 81 MG tablet Take 1 tablet (81 mg total) by mouth daily. 04/07/14  Yes Dayna N Dunn, PA-C  clopidogrel (PLAVIX) 75 MG tablet TAKE ONE TABLET BY MOUTH ONE TIME DAILY 07/18/13  Yes Jettie Booze, MD  gabapentin (NEURONTIN) 300 MG capsule Take 900 mg by mouth at bedtime.    Yes Historical Provider, MD  glimepiride (AMARYL) 4 MG tablet Take 4 mg by mouth 2 (two) times daily.    Yes Historical Provider, MD  insulin detemir (LEVEMIR) 100 UNIT/ML injection Inject 30-50 Units into the skin daily. CBG 150-200 = 30 units, 200-250 = 45 units, CBG > 250 = 50 units   Yes Historical Provider, MD  metFORMIN (GLUCOPHAGE) 1000 MG tablet Take 1 tablet (1,000 mg total) by mouth 2 (two) times daily. 11/13/11  Yes Jettie Booze, MD  nitroGLYCERIN (NITROSTAT) 0.4 MG SL tablet Place 0.4 mg under the tongue every 5 (five) minutes as needed for chest pain.   Yes Historical Provider, MD  potassium chloride SA (K-DUR,KLOR-CON) 20 MEQ tablet Take 1 tablet (20 mEq total) by mouth daily. 05/03/14 04/23/16 Yes Jettie Booze, MD  rosuvastatin  (CRESTOR) 10 MG tablet Take 10 mg by mouth See admin instructions. TAKE1 TABLET MON AND FRI   Yes Historical Provider, MD  telmisartan-hydrochlorothiazide (MICARDIS HCT) 80-12.5 MG per tablet 2 tablets by mouth daily. 11/07/13  Yes Jettie Booze, MD  terazosin (HYTRIN) 5 MG capsule Take 5 mg by mouth at bedtime.    Yes Historical Provider, MD   BP 139/70 mmHg  Pulse 70  Temp(Src) 97.5 F (36.4 C) (  Oral)  Resp 20  Ht 5\' 10"  (1.778 m)  Wt 197 lb (89.359 kg)  BMI 28.27 kg/m2  SpO2 97% Physical Exam  Constitutional: He is oriented to person, place, and time. He appears well-developed and well-nourished.  HENT:  Head: Normocephalic and atraumatic.  Eyes: Pupils are equal, round, and reactive to light.  Neck: Normal range of motion. Neck supple.  Cardiovascular: Normal rate, regular rhythm and normal heart sounds.   Pulmonary/Chest: Effort normal and breath sounds normal. No respiratory distress. He has no wheezes. He has no rales. He exhibits no tenderness.  Abdominal: Soft. Bowel sounds are normal. There is no tenderness. There is no rebound and no guarding.  Musculoskeletal: Normal range of motion. He exhibits no edema.  No calf tenderness  Lymphadenopathy:    He has no cervical adenopathy.  Neurological: He is alert and oriented to person, place, and time.  Skin: Skin is warm and dry. No rash noted.  Psychiatric: He has a normal mood and affect.    ED Course  Procedures (including critical care time) Labs Review Labs Reviewed  BASIC METABOLIC PANEL - Abnormal; Notable for the following:    GFR calc non Af Amer 71 (*)    GFR calc Af Amer 82 (*)    All other components within normal limits  CBC WITH DIFFERENTIAL - Abnormal; Notable for the following:    MCHC 36.3 (*)    Platelets 125 (*)    All other components within normal limits  PRO B NATRIURETIC PEPTIDE  I-STAT TROPOININ, ED    Imaging Review Dg Chest 2 View  08/30/2014   CLINICAL DATA:  73 year old with chest  pain  EXAM: CHEST  2 VIEW  COMPARISON:  04/05/2014  FINDINGS: The heart size and mediastinal contours are within normal limits.  There is no focal airspace consolidation, pleural effusion or pneumothorax.  Surgical clips project over the right upper quadrant.  Metallic anchors project over the right humerus.  The visualized skeletal structures are unremarkable.  IMPRESSION: No active cardiopulmonary disease.   Electronically Signed   By: Rosemarie Ax   On: 08/30/2014 13:09     EKG Interpretation   Date/Time:  Friday August 30 2014 12:21:41 EST Ventricular Rate:  75 PR Interval:  162 QRS Duration: 90 QT Interval:  368 QTC Calculation: 410 R Axis:   55 Text Interpretation:  Normal sinus rhythm Nonspecific T wave abnormality  Abnormal ECG since last tracing no significant change Confirmed by BELFI   MD, MELANIE ([id number]) on 08/30/2014 2:29:35 PM      MDM   Final diagnoses:  Chest pain    Spoke with cardiology service who will see pt.  Given ASA and nitro.    Malvin Johns, MD 08/30/14 [phone]

## 2014-09-02 ENCOUNTER — Ambulatory Visit: Payer: Medicare Other | Admitting: Physical Therapy

## 2014-09-02 DIAGNOSIS — M47816 Spondylosis without myelopathy or radiculopathy, lumbar region: Secondary | ICD-10-CM | POA: Diagnosis not present

## 2014-09-04 ENCOUNTER — Ambulatory Visit (INDEPENDENT_AMBULATORY_CARE_PROVIDER_SITE_OTHER): Payer: Medicare Other | Admitting: Interventional Cardiology

## 2014-09-04 ENCOUNTER — Encounter: Payer: Medicare Other | Admitting: Physical Therapy

## 2014-09-04 ENCOUNTER — Encounter: Payer: Self-pay | Admitting: Interventional Cardiology

## 2014-09-04 VITALS — BP 118/62 | HR 74 | Ht 70.0 in | Wt 199.0 lb

## 2014-09-04 DIAGNOSIS — I2510 Atherosclerotic heart disease of native coronary artery without angina pectoris: Secondary | ICD-10-CM | POA: Diagnosis not present

## 2014-09-04 DIAGNOSIS — I10 Essential (primary) hypertension: Secondary | ICD-10-CM

## 2014-09-04 DIAGNOSIS — E785 Hyperlipidemia, unspecified: Secondary | ICD-10-CM | POA: Insufficient documentation

## 2014-09-04 NOTE — Progress Notes (Signed)
Patient ID: Keith Espinoza, male   DOB: 1941/07/15, 73 y.o.   MRN: [id number] Patient ID: Keith Espinoza, male   DOB: 11-30-1940, 73 y.o.   MRN: [id number]    [address], [place], [place]  [address] Phone: [phone] Fax:  [phone]  Date:  09/04/2014   ID:  Keith Espinoza, DOB 03-Oct-1941, MRN [id number]  PCP:  Keith Screws, MD      History of Present Illness: Keith Espinoza is a 73 y.o. male who has had CAD. He has checked his BP. Higher when he eats salt. At other times it is in the 120s. CAD/ASCVD:  Denies :  Dizziness.  Dyspnea on exertion.  Fatigue.  Leg edema.  Nitroglycerin.  Palpitations.  Syncope.  He went to the ER due to chest discomfort. It was not as severe as his prior MI. He had a negative workup there. He was sent home from the ER. Imdur was started. He is feeling better now.  Wt Readings from Last 3 Encounters:  09/04/14 199 lb (90.266 kg)  08/30/14 197 lb (89.359 kg)  04/24/14 197 lb (89.359 kg)     Past Medical History  Diagnosis Date  . Heart murmur   . Coronary artery disease     a. Stenting to RCA 2004; staged DES to LAD and Cx 2004. DES to mRCA 2012. b. DES to mCx, PTCA to dCx 11/2011. c. Lateral wall MI 2013 s/p PTCA to distal Cx & DES to mid OM2 11/2011. d. Low risk nuc 04/2014, EF wnl.  . Arthritis   . Diabetes mellitus     Insulin dependent  . Dysrhythmia   . H/O hiatal hernia   . Chronic lower back pain   . Chronic leg pain     right  . Gout     right wrist; right foot; right elbow; have had it since 1970's  . Hypertension     Diagnosed 1995   . Vitamin B 12 deficiency     orally replaced  . Diverticulosis   . History of echocardiogram     aortic sclerosis per echo 12/09 EF 65%, otherwise normal  . Allergic rhinitis   . Diabetic neuropathy     MILD  . Gilbert syndrome   . History of hemorrhoids     BLEEDING  . Allergic rhinitis   . Pancreatic pseudocyst     a. s/p remote drainage 2006.  Marland Kitchen Thrombocytopenia       Seen on oldest labs in system from 2004    Current Outpatient Prescriptions  Medication Sig Dispense Refill  . acetaminophen (TYLENOL) 325 MG tablet Take 650 mg by mouth every 4 (four) hours as needed for mild pain.     Marland Kitchen allopurinol (ZYLOPRIM) 300 MG tablet Take 300 mg by mouth daily.     Marland Kitchen amLODipine (NORVASC) 5 MG tablet Take 5 mg by mouth daily.    Marland Kitchen aspirin EC 81 MG tablet Take 1 tablet (81 mg total) by mouth daily. 30 tablet 11  . clopidogrel (PLAVIX) 75 MG tablet TAKE ONE TABLET BY MOUTH ONE TIME DAILY 30 tablet 5  . gabapentin (NEURONTIN) 300 MG capsule Take 900 mg by mouth at bedtime.     Marland Kitchen glimepiride (AMARYL) 4 MG tablet Take 4 mg by mouth 2 (two) times daily.     . insulin detemir (LEVEMIR) 100 UNIT/ML injection Inject 30-50 Units into the skin daily. CBG 150-200 = 30 units, 200-250 = 45 units, CBG >  250 = 50 units    . isosorbide mononitrate (IMDUR) 30 MG 24 hr tablet Take 1 tablet (30 mg total) by mouth daily. 30 tablet 11  . metFORMIN (GLUCOPHAGE) 1000 MG tablet Take 1 tablet (1,000 mg total) by mouth 2 (two) times daily.    . nitroGLYCERIN (NITROSTAT) 0.4 MG SL tablet Place 0.4 mg under the tongue every 5 (five) minutes as needed for chest pain.    . potassium chloride SA (K-DUR,KLOR-CON) 20 MEQ tablet Take 1 tablet (20 mEq total) by mouth daily. 30 tablet 6  . rosuvastatin (CRESTOR) 10 MG tablet Take 10 mg by mouth See admin instructions. TAKE1 TABLET MON AND FRI    . telmisartan-hydrochlorothiazide (MICARDIS HCT) 80-12.5 MG per tablet 2 tablets by mouth daily.    Marland Kitchen terazosin (HYTRIN) 5 MG capsule Take 5 mg by mouth at bedtime.      No current facility-administered medications for this visit.    Allergies:    Allergies  Allergen Reactions  . Fish Oil Nausea Only  . Simvastatin     SEVERE MYALGIAS   . Zetia [Ezetimibe]     MYALGIAS    Social History:  The patient  reports that he quit smoking about 40 years ago. His smoking use included Cigarettes. He has a 1.5  pack-year smoking history. His smokeless tobacco use includes Chew. He reports that he does not drink alcohol or use illicit drugs.   Family History:  The patient's family history includes Cancer in his brother and father; Diabetes in his mother and sister; Hyperlipidemia in his mother; Hypertension in his father, mother, and sister.   ROS:  Please see the history of present illness.  No nausea, vomiting.  No fevers, chills.  No focal weakness.  No dysuria. t All other systems reviewed and negative.   PHYSICAL EXAM: VS:  BP 118/62 mmHg  Pulse 74  Ht 5\' 10"  (1.778 m)  Wt 199 lb (90.266 kg)  BMI 28.55 kg/m2  SpO2 90% Well nourished, well developed, in no acute distress HEENT: normal Neck: no JVD, no carotid bruits Cardiac:  normal S1, S2; RRR;  Lungs:  clear to auscultation bilaterally, no wheezing, rhonchi or rales Abd: soft, nontender, no hepatomegaly Ext: no edema Skin: warm and dry Neuro:   no focal abnormalities noted  EKG:  NSR, NSST changes    ASSESSMENT AND PLAN:  CAD in native artery  Continue Plavix Tablet, 75 MG, 1 tablet, Orally, Once a day Continue Nitroglycerin 0.4 mg tablet, 0.4 mg, 1 tablet as directed, SL, as directed prn chest pain, 25, Refills 6 Continue Aspirin Tablet, 325 MG, 1/2 tablet, Orally, Once a day Notes: Question of angina around Thanksgiving. He had a negative workup in the emergency room. I personally reviewed the records.  He was not admitted. He has felt much better at home. His wife states that he is back to his usual activities.  If his anginal type symptoms return, would schedule heart cath. He will let us know if there is any change. Decreased aspirin 81 mg daily. Continue aggressive secondary prevention.    2. Hyperlipidemia  Continue Crestor Tablet, 10 MG, 1 tablet, Orally, M and F / wk Notes: LDL 90 in 11/2012. LDL 58 in July 2015 .   3. Essential hypertension, benign  Continue Micardis HCT Tablet, 80-12.5 MG, 2 tablet, Orally, Once a  day Continue Amlodipine Besylate Tablet, 5 MG, 1 tablet, Orally, Once a day, 30 day(s), 30, Refills 11 Notes: Controlled. If he requires more  antianginal therapy, could decrease Micardis/HCTZ dose to 1 tablet daily. His blood pressure has been somewhat low at times.   Currently taking terazosin 5 mg daily for prostate issues.      Preventive Medicine  Adult topics discussed:  Diet: healthy diet. Low salt Exercise: 5 days a week, at least 30 minutes of aerobic exercise.      Signed, Mina Marble, MD, [facility] 09/04/2014 12:23 PM

## 2014-09-04 NOTE — Patient Instructions (Signed)
Your physician recommends that you continue on your current medications as directed. Please refer to the Current Medication list given to you today.   Your physician recommends that you schedule a follow-up appointment in: 10/23/2014 1:30pm with Dr. Irish Lack.

## 2014-09-05 ENCOUNTER — Ambulatory Visit: Payer: Medicare Other | Attending: Orthopaedic Surgery | Admitting: Physical Therapy

## 2014-09-05 DIAGNOSIS — M479 Spondylosis, unspecified: Secondary | ICD-10-CM | POA: Diagnosis not present

## 2014-09-05 DIAGNOSIS — M4312 Spondylolisthesis, cervical region: Secondary | ICD-10-CM | POA: Insufficient documentation

## 2014-09-09 ENCOUNTER — Ambulatory Visit: Payer: Medicare Other | Admitting: Physical Therapy

## 2014-09-09 DIAGNOSIS — M4312 Spondylolisthesis, cervical region: Secondary | ICD-10-CM | POA: Diagnosis not present

## 2014-09-09 DIAGNOSIS — M479 Spondylosis, unspecified: Secondary | ICD-10-CM | POA: Diagnosis not present

## 2014-09-11 ENCOUNTER — Encounter (HOSPITAL_COMMUNITY): Payer: Self-pay | Admitting: Interventional Cardiology

## 2014-09-12 ENCOUNTER — Encounter: Payer: Medicare Other | Admitting: Physical Therapy

## 2014-09-12 ENCOUNTER — Encounter (HOSPITAL_COMMUNITY): Payer: Self-pay | Admitting: Interventional Cardiology

## 2014-09-16 ENCOUNTER — Ambulatory Visit: Payer: Medicare Other | Admitting: Physical Therapy

## 2014-09-16 DIAGNOSIS — M4312 Spondylolisthesis, cervical region: Secondary | ICD-10-CM | POA: Diagnosis not present

## 2014-09-16 DIAGNOSIS — M479 Spondylosis, unspecified: Secondary | ICD-10-CM | POA: Diagnosis not present

## 2014-09-23 ENCOUNTER — Ambulatory Visit: Payer: Medicare Other | Admitting: Physical Therapy

## 2014-09-23 DIAGNOSIS — M4312 Spondylolisthesis, cervical region: Secondary | ICD-10-CM | POA: Diagnosis not present

## 2014-09-23 DIAGNOSIS — M479 Spondylosis, unspecified: Secondary | ICD-10-CM | POA: Diagnosis not present

## 2014-09-24 ENCOUNTER — Ambulatory Visit: Payer: Medicare Other | Admitting: Physical Therapy

## 2014-09-24 DIAGNOSIS — M479 Spondylosis, unspecified: Secondary | ICD-10-CM | POA: Diagnosis not present

## 2014-09-24 DIAGNOSIS — M4312 Spondylolisthesis, cervical region: Secondary | ICD-10-CM | POA: Diagnosis not present

## 2014-10-08 DIAGNOSIS — M1611 Unilateral primary osteoarthritis, right hip: Secondary | ICD-10-CM | POA: Diagnosis not present

## 2014-10-08 DIAGNOSIS — M542 Cervicalgia: Secondary | ICD-10-CM | POA: Diagnosis not present

## 2014-10-08 DIAGNOSIS — M545 Low back pain: Secondary | ICD-10-CM | POA: Diagnosis not present

## 2014-10-23 ENCOUNTER — Ambulatory Visit: Payer: Medicare Other | Admitting: Interventional Cardiology

## 2014-11-22 ENCOUNTER — Other Ambulatory Visit: Payer: Self-pay | Admitting: *Deleted

## 2014-11-22 MED ORDER — POTASSIUM CHLORIDE CRYS ER 20 MEQ PO TBCR: 20.0000 meq | EXTENDED_RELEASE_TABLET | Freq: Every day | ORAL | Status: DC

## 2014-11-28 DIAGNOSIS — M7071 Other bursitis of hip, right hip: Secondary | ICD-10-CM | POA: Diagnosis not present

## 2015-01-27 DIAGNOSIS — I25118 Atherosclerotic heart disease of native coronary artery with other forms of angina pectoris: Secondary | ICD-10-CM | POA: Diagnosis not present

## 2015-01-27 DIAGNOSIS — M542 Cervicalgia: Secondary | ICD-10-CM | POA: Diagnosis not present

## 2015-01-27 DIAGNOSIS — E559 Vitamin D deficiency, unspecified: Secondary | ICD-10-CM | POA: Diagnosis not present

## 2015-01-27 DIAGNOSIS — K219 Gastro-esophageal reflux disease without esophagitis: Secondary | ICD-10-CM | POA: Diagnosis not present

## 2015-01-27 DIAGNOSIS — Z0001 Encounter for general adult medical examination with abnormal findings: Secondary | ICD-10-CM | POA: Diagnosis not present

## 2015-01-27 DIAGNOSIS — I10 Essential (primary) hypertension: Secondary | ICD-10-CM | POA: Diagnosis not present

## 2015-01-27 DIAGNOSIS — Z1389 Encounter for screening for other disorder: Secondary | ICD-10-CM | POA: Diagnosis not present

## 2015-01-27 DIAGNOSIS — E784 Other hyperlipidemia: Secondary | ICD-10-CM | POA: Diagnosis not present

## 2015-01-27 DIAGNOSIS — M109 Gout, unspecified: Secondary | ICD-10-CM | POA: Diagnosis not present

## 2015-01-27 DIAGNOSIS — L570 Actinic keratosis: Secondary | ICD-10-CM | POA: Diagnosis not present

## 2015-01-27 DIAGNOSIS — E1140 Type 2 diabetes mellitus with diabetic neuropathy, unspecified: Secondary | ICD-10-CM | POA: Diagnosis not present

## 2015-01-27 DIAGNOSIS — N400 Enlarged prostate without lower urinary tract symptoms: Secondary | ICD-10-CM | POA: Diagnosis not present

## 2015-01-27 DIAGNOSIS — Z79899 Other long term (current) drug therapy: Secondary | ICD-10-CM | POA: Diagnosis not present

## 2015-02-18 DIAGNOSIS — M545 Low back pain: Secondary | ICD-10-CM | POA: Diagnosis not present

## 2015-02-18 DIAGNOSIS — M7071 Other bursitis of hip, right hip: Secondary | ICD-10-CM | POA: Diagnosis not present

## 2015-02-19 ENCOUNTER — Other Ambulatory Visit: Payer: Self-pay | Admitting: Orthopaedic Surgery

## 2015-02-19 DIAGNOSIS — M542 Cervicalgia: Secondary | ICD-10-CM

## 2015-02-20 ENCOUNTER — Ambulatory Visit
Admission: RE | Admit: 2015-02-20 | Discharge: 2015-02-20 | Disposition: A | Discharge status: Home / Self Care | Payer: Medicare Other | Source: Ambulatory Visit | Attending: Orthopaedic Surgery | Admitting: Orthopaedic Surgery

## 2015-02-20 DIAGNOSIS — M5022 Other cervical disc displacement, mid-cervical region: Secondary | ICD-10-CM | POA: Diagnosis not present

## 2015-02-20 DIAGNOSIS — M5032 Other cervical disc degeneration, mid-cervical region: Secondary | ICD-10-CM | POA: Diagnosis not present

## 2015-02-20 DIAGNOSIS — M542 Cervicalgia: Secondary | ICD-10-CM

## 2015-02-20 DIAGNOSIS — M47812 Spondylosis without myelopathy or radiculopathy, cervical region: Secondary | ICD-10-CM | POA: Diagnosis not present

## 2015-02-26 DIAGNOSIS — M5030 Other cervical disc degeneration, unspecified cervical region: Secondary | ICD-10-CM | POA: Diagnosis not present

## 2015-02-26 DIAGNOSIS — M542 Cervicalgia: Secondary | ICD-10-CM | POA: Diagnosis not present

## 2015-02-26 DIAGNOSIS — M47812 Spondylosis without myelopathy or radiculopathy, cervical region: Secondary | ICD-10-CM | POA: Diagnosis not present

## 2015-03-05 ENCOUNTER — Other Ambulatory Visit: Payer: Self-pay | Admitting: Physician Assistant

## 2015-03-05 NOTE — Telephone Encounter (Signed)
Dr. Irish Lack-  Below is what you had dictated in your office note from last OV on 09/04/14.  CAD in native artery  Continue Plavix Tablet, 75 MG, 1 tablet, Orally, Once a day Continue Nitroglycerin 0.4 mg tablet, 0.4 mg, 1 tablet as directed, SL, as directed prn chest pain, 25, Refills 6 Continue Aspirin Tablet, 325 MG, 1/2 tablet, Orally, Once a day Notes: Question of angina around Thanksgiving. He had a negative workup in the emergency room. I personally reviewed the records. He was not admitted. He has felt much better at home. His wife states that he is back to his usual activities. If his anginal type symptoms return, would schedule heart cath. He will let us know if there is any change. Decreased aspirin 81 mg daily. Continue aggressive secondary prevention.   Pharmacy requesting pt to take ASA 81mg  and ASA 81mg  is what was listed in the pt's med list. Is it ok to switch to ASA 81mg  or do you want pt to continue 1/2 tab of ASA 325mg ?

## 2015-03-05 NOTE — Telephone Encounter (Signed)
Dr. Irish Lack said ok for Aspirin 81mg 

## 2015-03-05 NOTE — Telephone Encounter (Signed)
Ok for Aspirin 81 mg daily

## 2015-03-05 NOTE — Telephone Encounter (Signed)
Per last ov, in Dr Hassell Done dictation, patient is to continue asa 325mg  tablet (takes 0.5 tablet qd), but pharmacy is requesting asa 81mg . Please advise. Thanks, MI

## 2015-03-08 ENCOUNTER — Other Ambulatory Visit: Payer: No Typology Code available for payment source

## 2015-03-26 DIAGNOSIS — M545 Low back pain: Secondary | ICD-10-CM | POA: Diagnosis not present

## 2015-03-31 ENCOUNTER — Other Ambulatory Visit: Payer: Self-pay | Admitting: Orthopaedic Surgery

## 2015-03-31 DIAGNOSIS — M545 Low back pain: Secondary | ICD-10-CM

## 2015-04-04 DIAGNOSIS — M722 Plantar fascial fibromatosis: Secondary | ICD-10-CM | POA: Diagnosis not present

## 2015-04-05 ENCOUNTER — Ambulatory Visit
Admission: RE | Admit: 2015-04-05 | Discharge: 2015-04-05 | Disposition: A | Discharge status: Home / Self Care | Payer: Medicare Other | Source: Ambulatory Visit | Attending: Orthopaedic Surgery | Admitting: Orthopaedic Surgery

## 2015-04-05 DIAGNOSIS — M545 Low back pain: Secondary | ICD-10-CM

## 2015-04-05 DIAGNOSIS — M5127 Other intervertebral disc displacement, lumbosacral region: Secondary | ICD-10-CM | POA: Diagnosis not present

## 2015-04-16 DIAGNOSIS — M545 Low back pain: Secondary | ICD-10-CM | POA: Diagnosis not present

## 2015-04-16 DIAGNOSIS — M25551 Pain in right hip: Secondary | ICD-10-CM | POA: Diagnosis not present

## 2015-04-21 DIAGNOSIS — M545 Low back pain: Secondary | ICD-10-CM | POA: Diagnosis not present

## 2015-04-21 DIAGNOSIS — M25551 Pain in right hip: Secondary | ICD-10-CM | POA: Diagnosis not present

## 2015-04-24 DIAGNOSIS — M545 Low back pain: Secondary | ICD-10-CM | POA: Diagnosis not present

## 2015-04-24 DIAGNOSIS — M25511 Pain in right shoulder: Secondary | ICD-10-CM | POA: Diagnosis not present

## 2015-04-28 DIAGNOSIS — M25551 Pain in right hip: Secondary | ICD-10-CM | POA: Diagnosis not present

## 2015-04-28 DIAGNOSIS — M545 Low back pain: Secondary | ICD-10-CM | POA: Diagnosis not present

## 2015-04-30 DIAGNOSIS — M25551 Pain in right hip: Secondary | ICD-10-CM | POA: Diagnosis not present

## 2015-05-02 ENCOUNTER — Other Ambulatory Visit (HOSPITAL_COMMUNITY): Payer: Self-pay | Admitting: Orthopaedic Surgery

## 2015-05-02 ENCOUNTER — Other Ambulatory Visit: Payer: Self-pay | Admitting: Interventional Cardiology

## 2015-05-02 DIAGNOSIS — M25551 Pain in right hip: Secondary | ICD-10-CM

## 2015-05-02 DIAGNOSIS — M25552 Pain in left hip: Secondary | ICD-10-CM

## 2015-05-07 ENCOUNTER — Encounter (HOSPITAL_COMMUNITY)
Admission: RE | Admit: 2015-05-07 | Discharge: 2015-05-07 | Disposition: A | Discharge status: Home / Self Care | Payer: Medicare Other | Source: Ambulatory Visit | Attending: Orthopaedic Surgery | Admitting: Orthopaedic Surgery

## 2015-05-07 DIAGNOSIS — M25551 Pain in right hip: Secondary | ICD-10-CM | POA: Diagnosis not present

## 2015-05-07 DIAGNOSIS — M25552 Pain in left hip: Secondary | ICD-10-CM | POA: Diagnosis not present

## 2015-05-07 MED ORDER — TECHNETIUM TC 99M MEDRONATE IV KIT
25.0000 | PACK | Freq: Once | INTRAVENOUS | Status: AC | PRN
  Administered 2015-05-07: 25 via INTRAVENOUS

## 2015-05-08 DIAGNOSIS — M545 Low back pain: Secondary | ICD-10-CM | POA: Diagnosis not present

## 2015-05-08 DIAGNOSIS — M25551 Pain in right hip: Secondary | ICD-10-CM | POA: Diagnosis not present

## 2015-05-08 DIAGNOSIS — M47812 Spondylosis without myelopathy or radiculopathy, cervical region: Secondary | ICD-10-CM | POA: Diagnosis not present

## 2015-07-31 DIAGNOSIS — E559 Vitamin D deficiency, unspecified: Secondary | ICD-10-CM | POA: Diagnosis not present

## 2015-07-31 DIAGNOSIS — N400 Enlarged prostate without lower urinary tract symptoms: Secondary | ICD-10-CM | POA: Diagnosis not present

## 2015-07-31 DIAGNOSIS — Z23 Encounter for immunization: Secondary | ICD-10-CM | POA: Diagnosis not present

## 2015-07-31 DIAGNOSIS — I10 Essential (primary) hypertension: Secondary | ICD-10-CM | POA: Diagnosis not present

## 2015-07-31 DIAGNOSIS — K219 Gastro-esophageal reflux disease without esophagitis: Secondary | ICD-10-CM | POA: Diagnosis not present

## 2015-07-31 DIAGNOSIS — M5430 Sciatica, unspecified side: Secondary | ICD-10-CM | POA: Diagnosis not present

## 2015-07-31 DIAGNOSIS — E1140 Type 2 diabetes mellitus with diabetic neuropathy, unspecified: Secondary | ICD-10-CM | POA: Diagnosis not present

## 2015-07-31 DIAGNOSIS — I25118 Atherosclerotic heart disease of native coronary artery with other forms of angina pectoris: Secondary | ICD-10-CM | POA: Diagnosis not present

## 2015-07-31 DIAGNOSIS — M109 Gout, unspecified: Secondary | ICD-10-CM | POA: Diagnosis not present

## 2015-07-31 DIAGNOSIS — Z794 Long term (current) use of insulin: Secondary | ICD-10-CM | POA: Diagnosis not present

## 2015-07-31 DIAGNOSIS — M542 Cervicalgia: Secondary | ICD-10-CM | POA: Diagnosis not present

## 2015-07-31 DIAGNOSIS — E784 Other hyperlipidemia: Secondary | ICD-10-CM | POA: Diagnosis not present

## 2015-08-06 DIAGNOSIS — M7061 Trochanteric bursitis, right hip: Secondary | ICD-10-CM | POA: Diagnosis not present

## 2015-08-11 DIAGNOSIS — Z961 Presence of intraocular lens: Secondary | ICD-10-CM | POA: Diagnosis not present

## 2015-08-11 DIAGNOSIS — Z794 Long term (current) use of insulin: Secondary | ICD-10-CM | POA: Diagnosis not present

## 2015-08-11 DIAGNOSIS — H04123 Dry eye syndrome of bilateral lacrimal glands: Secondary | ICD-10-CM | POA: Diagnosis not present

## 2015-08-11 DIAGNOSIS — E119 Type 2 diabetes mellitus without complications: Secondary | ICD-10-CM | POA: Diagnosis not present

## 2015-08-14 ENCOUNTER — Other Ambulatory Visit: Payer: Self-pay | Admitting: Physician Assistant

## 2015-10-17 ENCOUNTER — Other Ambulatory Visit: Payer: Self-pay | Admitting: Physician Assistant

## 2015-11-05 DIAGNOSIS — M7061 Trochanteric bursitis, right hip: Secondary | ICD-10-CM | POA: Diagnosis not present

## 2015-11-05 DIAGNOSIS — M25562 Pain in left knee: Secondary | ICD-10-CM | POA: Diagnosis not present

## 2015-11-05 DIAGNOSIS — M25551 Pain in right hip: Secondary | ICD-10-CM | POA: Diagnosis not present

## 2015-12-03 DIAGNOSIS — M25551 Pain in right hip: Secondary | ICD-10-CM | POA: Diagnosis not present

## 2015-12-03 DIAGNOSIS — M25562 Pain in left knee: Secondary | ICD-10-CM | POA: Diagnosis not present

## 2015-12-23 ENCOUNTER — Other Ambulatory Visit: Payer: Self-pay | Admitting: Physician Assistant

## 2016-01-01 ENCOUNTER — Ambulatory Visit (INDEPENDENT_AMBULATORY_CARE_PROVIDER_SITE_OTHER): Payer: Medicare Other | Admitting: Interventional Cardiology

## 2016-01-01 ENCOUNTER — Encounter: Payer: Self-pay | Admitting: Interventional Cardiology

## 2016-01-01 VITALS — BP 110/70 | HR 76 | Ht 70.0 in | Wt 202.8 lb

## 2016-01-01 DIAGNOSIS — E785 Hyperlipidemia, unspecified: Secondary | ICD-10-CM

## 2016-01-01 DIAGNOSIS — E119 Type 2 diabetes mellitus without complications: Secondary | ICD-10-CM | POA: Diagnosis not present

## 2016-01-01 DIAGNOSIS — I10 Essential (primary) hypertension: Secondary | ICD-10-CM

## 2016-01-01 DIAGNOSIS — I2510 Atherosclerotic heart disease of native coronary artery without angina pectoris: Secondary | ICD-10-CM

## 2016-01-01 DIAGNOSIS — Z794 Long term (current) use of insulin: Secondary | ICD-10-CM

## 2016-01-01 MED ORDER — ROSUVASTATIN CALCIUM 10 MG PO TABS: 10.0000 mg | ORAL_TABLET | Freq: Every day | ORAL | Status: DC

## 2016-01-01 NOTE — Progress Notes (Signed)
Patient ID: Keith Espinoza, male   DOB: Oct 26, 1940, 75 y.o.   MRN: [id number]     Cardiology Office Note   Date:  01/01/2016   ID:  Keith Espinoza, DOB 08-Jun-1941, MRN [id number]  PCP:  Henrine Screws, MD    No chief complaint on file. f/u CAD   Wt Readings from Last 3 Encounters:  01/01/16 202 lb 12.8 oz (91.989 kg)  09/04/14 199 lb (90.266 kg)  08/30/14 197 lb (89.359 kg)       History of Present Illness: Keith Espinoza is a 75 y.o. male  who has had CAD. He has checked his BP. Higher when he eats salt. At other times it is in the 120s. CAD/ASCVD:  Denies :  Dizziness.  Dyspnea on exertion.  Fatigue.  Leg edema.  Nitroglycerin.  Palpitations.  Syncope.  In 11/15, He went to the ER due to chest discomfort. It was not as severe as his prior MI. He had a negative workup there. He was sent home from the ER. Imdur was started. He is feeling better now.  No further chest pain.  He works in the yard.  He worked on his Nurse, learning disability.  No NTG use.  He had one fall and hurt the right side of his chest in 2/17.  No broken ribs at that time.    Past Medical History  Diagnosis Date  . Heart murmur   . Coronary artery disease     a. Stenting to RCA 2004; staged DES to LAD and Cx 2004. DES to mRCA 2012. b. DES to mCx, PTCA to dCx 11/2011. c. Lateral wall MI 2013 s/p PTCA to distal Cx & DES to mid OM2 11/2011. d. Low risk nuc 04/2014, EF wnl.  . Arthritis   . Diabetes mellitus     Insulin dependent  . Dysrhythmia   . H/O hiatal hernia   . Chronic lower back pain   . Chronic leg pain     right  . Gout     right wrist; right foot; right elbow; have had it since 1970's  . Hypertension     Diagnosed 1995   . Vitamin B 12 deficiency     orally replaced  . Diverticulosis   . History of echocardiogram     aortic sclerosis per echo 12/09 EF 65%, otherwise normal  . Allergic rhinitis   . Diabetic neuropathy (HCC)     MILD  . Gilbert syndrome   . History of hemorrhoids     BLEEDING  . Allergic rhinitis   . Pancreatic pseudocyst     a. s/p remote drainage 2006.  Marland Kitchen Thrombocytopenia ([place])     Seen on oldest labs in system from 2004    Past Surgical History  Procedure Laterality Date  . Knee arthroscopy  1990's    left  . Joint replacement  04/03/2002    right hip replacment  . Tonsillectomy  ~ 1948  . Cholecystectomy  1990's  . Inguinal hernia repair  2003    right  . Back surgery      "total of 3 times" S/P fall   . Coronary angioplasty with stent placement  09/30/2011    "1 then; makes a total of 4"  . Coronary angioplasty with stent placement  11/11/11    "1; makes a total of 5"  . Coronary angioplasty  11/11/11  . Left heart catheterization with coronary angiogram N/A 09/30/2011    Procedure: LEFT HEART CATHETERIZATION WITH CORONARY  ANGIOGRAM;  Surgeon: Jettie Booze, MD;  Location: [facility] CATH LAB;  Service: Cardiovascular;  Laterality: N/A;  possible PCI  . Percutaneous coronary stent intervention (pci-s)  09/30/2011    Procedure: PERCUTANEOUS CORONARY STENT INTERVENTION (PCI-S);  Surgeon: Jettie Booze, MD;  Location: [facility] CATH LAB;  Service: Cardiovascular;;  . Percutaneous coronary stent intervention (pci-s) N/A 11/11/2011    Procedure: PERCUTANEOUS CORONARY STENT INTERVENTION (PCI-S);  Surgeon: Jettie Booze, MD;  Location: [facility] CATH LAB;  Service: Cardiovascular;  Laterality: N/A;  . Left heart catheterization with coronary angiogram N/A 11/15/2011    Procedure: LEFT HEART CATHETERIZATION WITH CORONARY ANGIOGRAM;  Surgeon: Jettie Booze, MD;  Location: [facility] CATH LAB;  Service: Cardiovascular;  Laterality: N/A;     Current Outpatient Prescriptions  Medication Sig Dispense Refill  . acetaminophen (TYLENOL) 325 MG tablet Take 650 mg by mouth every 4 (four) hours as needed for mild pain.     Marland Kitchen allopurinol (ZYLOPRIM) 300 MG tablet Take 300 mg by mouth daily.     Marland Kitchen amLODipine (NORVASC) 5 MG tablet Take 5 mg by mouth daily.    Marland Kitchen aspirin  81 MG EC tablet TAKE 1 TABLET (81 MG TOTAL) BY MOUTH DAILY. 30 tablet 0  . clopidogrel (PLAVIX) 75 MG tablet TAKE ONE TABLET BY MOUTH ONE TIME DAILY 30 tablet 5  . gabapentin (NEURONTIN) 100 MG capsule Take 100 mg by mouth 2 (two) times daily. TAKE 400 MG  BY MOUTH IN THE AM AND TAKE 400 MG  BY MOUTH IN THE PM    . glimepiride (AMARYL) 4 MG tablet Take 4 mg by mouth 2 (two) times daily.     . insulin detemir (LEVEMIR) 100 UNIT/ML injection Inject 30-50 Units into the skin daily. CBG 150-200 = 30 units, 200-250 = 45 units, CBG > 250 = 50 units    . isosorbide mononitrate (IMDUR) 30 MG 24 hr tablet TAKE 1 TABLET (30 MG TOTAL) BY MOUTH DAILY. 30 tablet 0  . metFORMIN (GLUCOPHAGE) 1000 MG tablet Take 1 tablet (1,000 mg total) by mouth 2 (two) times daily.    . nitroGLYCERIN (NITROSTAT) 0.4 MG SL tablet Place 0.4 mg under the tongue every 5 (five) minutes as needed for chest pain.    . potassium chloride SA (K-DUR,KLOR-CON) 20 MEQ tablet Take 1 tablet (20 mEq total) by mouth daily. 30 tablet 6  . rosuvastatin (CRESTOR) 10 MG tablet Take 10 mg by mouth See admin instructions. TAKE1 TABLET MON AND FRI    . telmisartan-hydrochlorothiazide (MICARDIS HCT) 80-12.5 MG per tablet 2 tablets by mouth daily.    Marland Kitchen terazosin (HYTRIN) 5 MG capsule Take 5 mg by mouth at bedtime.      No current facility-administered medications for this visit.    Allergies:   Fish oil; Simvastatin; and Zetia    Social History:  The patient  reports that he quit smoking about 42 years ago. His smoking use included Cigarettes. He has a 1.5 pack-year smoking history. His smokeless tobacco use includes Chew. He reports that he does not drink alcohol or use illicit drugs.   Family History:  The patient's family history includes Cancer in his brother and father; Diabetes in his mother and sister; Hyperlipidemia in his mother; Hypertension in his father, mother, and sister. There is no history of Heart attack.    ROS:  Please see the  history of present illness.   Otherwise, review of systems are positive for right chest pain after a fall.  All other systems are reviewed and negative.    PHYSICAL EXAM: VS:  BP 110/70 mmHg  Pulse 76  Ht 5\' 10"  (1.778 m)  Wt 202 lb 12.8 oz (91.989 kg)  BMI 29.10 kg/m2 , BMI Body mass index is 29.1 kg/(m^2). GEN: Well nourished, well developed, in no acute distress HEENT: normal Neck: no JVD, carotid bruits, or masses Cardiac: RRR; no murmurs, rubs, or gallops,no edema  Respiratory:  clear to auscultation bilaterally, normal work of breathing GI: soft, nontender, nondistended, + BS MS: no deformity or atrophy Skin: warm and dry, no rash Neuro:  Strength and sensation are intact Psych: euthymic mood, full affect   EKG:   The ekg ordered today demonstrates normal sinus rhythm, no ST segment changes   Recent Labs: No results found for requested labs within last 365 days.   Lipid Panel    Component Value Date/Time   CHOL 117 04/06/2014 0230   TRIG 123 04/06/2014 0230   HDL 34* 04/06/2014 0230   CHOLHDL 3.4 04/06/2014 0230   VLDL 25 04/06/2014 0230   LDLCALC 58 04/06/2014 0230     Other studies Reviewed: Additional studies/ records that were reviewed today with results demonstrating: .   ASSESSMENT AND PLAN:  1. CAD: No angina.  Continue clopidogrel given that he has multiple stents.  Could consider stopping aspirin if there are bleeding issues. 2. Hyperlipidemia: Followed by Dr. Inda Merlin.  He will have blood work in 5/17.  He will have a physical at that time. He stopped taking Crestor. I've asked him to restart Crestor 10 mg once a week. Will increase frequency depending on his cholesterol results. He has had myalgias in the past with statins. 3. HTN: Blood pressure well controlled. Continue current medicines. 4. DM: Managed by primary care doctor.   Current medicines are reviewed at length with the patient today.  The patient concerns regarding his medicines were  addressed.  The following changes have been made:  Restart Crestor 10 mg once a week.    Labs/ tests ordered today include:  No orders of the defined types were placed in this encounter.    Recommend 150 minutes/week of aerobic exercise Low fat, low carb, high fiber diet recommended  Disposition:   FU in 1 year   Teresita Madura., MD  01/01/2016 10:51 AM    [facility] [address], [place], [place]  [address] Phone: [phone]; Fax: [phone]

## 2016-01-01 NOTE — Patient Instructions (Signed)
Medication Instructions:  Start taking Rosuvastatin 10 mg once weekly-all other medications will remain the same.   Labwork: None  Testing/Procedures: None  Follow-Up: Your physician wants you to follow-up in: 1 Year. You will receive a reminder letter in the mail two months in advance. If you don't receive a letter, please call our office to schedule the follow-up appointment.     If you need a refill on your cardiac medications before your next appointment, please call your pharmacy.

## 2016-01-20 ENCOUNTER — Other Ambulatory Visit: Payer: Self-pay | Admitting: Physician Assistant

## 2016-01-26 DIAGNOSIS — L821 Other seborrheic keratosis: Secondary | ICD-10-CM | POA: Diagnosis not present

## 2016-01-26 DIAGNOSIS — L570 Actinic keratosis: Secondary | ICD-10-CM | POA: Diagnosis not present

## 2016-04-14 DIAGNOSIS — M25562 Pain in left knee: Secondary | ICD-10-CM | POA: Diagnosis not present

## 2016-04-21 DIAGNOSIS — E1140 Type 2 diabetes mellitus with diabetic neuropathy, unspecified: Secondary | ICD-10-CM | POA: Diagnosis not present

## 2016-04-21 DIAGNOSIS — I10 Essential (primary) hypertension: Secondary | ICD-10-CM | POA: Diagnosis not present

## 2016-04-21 DIAGNOSIS — Z0001 Encounter for general adult medical examination with abnormal findings: Secondary | ICD-10-CM | POA: Diagnosis not present

## 2016-04-21 DIAGNOSIS — E559 Vitamin D deficiency, unspecified: Secondary | ICD-10-CM | POA: Diagnosis not present

## 2016-04-21 DIAGNOSIS — E1342 Other specified diabetes mellitus with diabetic polyneuropathy: Secondary | ICD-10-CM | POA: Diagnosis not present

## 2016-04-21 DIAGNOSIS — M109 Gout, unspecified: Secondary | ICD-10-CM | POA: Diagnosis not present

## 2016-04-21 DIAGNOSIS — Z1389 Encounter for screening for other disorder: Secondary | ICD-10-CM | POA: Diagnosis not present

## 2016-04-21 DIAGNOSIS — N400 Enlarged prostate without lower urinary tract symptoms: Secondary | ICD-10-CM | POA: Diagnosis not present

## 2016-04-21 DIAGNOSIS — K219 Gastro-esophageal reflux disease without esophagitis: Secondary | ICD-10-CM | POA: Diagnosis not present

## 2016-04-21 DIAGNOSIS — E784 Other hyperlipidemia: Secondary | ICD-10-CM | POA: Diagnosis not present

## 2016-04-21 DIAGNOSIS — I25118 Atherosclerotic heart disease of native coronary artery with other forms of angina pectoris: Secondary | ICD-10-CM | POA: Diagnosis not present

## 2016-04-21 DIAGNOSIS — Z79899 Other long term (current) drug therapy: Secondary | ICD-10-CM | POA: Diagnosis not present

## 2016-04-21 DIAGNOSIS — Z794 Long term (current) use of insulin: Secondary | ICD-10-CM | POA: Diagnosis not present

## 2016-05-12 DIAGNOSIS — M1712 Unilateral primary osteoarthritis, left knee: Secondary | ICD-10-CM | POA: Diagnosis not present

## 2016-05-19 DIAGNOSIS — M1712 Unilateral primary osteoarthritis, left knee: Secondary | ICD-10-CM | POA: Diagnosis not present

## 2016-05-22 ENCOUNTER — Encounter (INDEPENDENT_AMBULATORY_CARE_PROVIDER_SITE_OTHER): Payer: Self-pay

## 2016-05-25 DIAGNOSIS — M1712 Unilateral primary osteoarthritis, left knee: Secondary | ICD-10-CM | POA: Diagnosis not present

## 2016-06-02 DIAGNOSIS — M25562 Pain in left knee: Secondary | ICD-10-CM | POA: Diagnosis not present

## 2016-06-18 ENCOUNTER — Telehealth: Payer: Self-pay

## 2016-06-18 NOTE — Telephone Encounter (Signed)
No further cardiac testing needed before surgery.  OK to hold Plavix 5 days prior to procedure.

## 2016-06-18 NOTE — Telephone Encounter (Signed)
The pt needs to have left total knee replacement with Dr Rodell Perna at [facility]. They are requesting cardiac clearance. Also, the pt is taking Plavix 75 mg daily.  Please advise.

## 2016-06-21 NOTE — Telephone Encounter (Signed)
This message has been faxed to [place] at [phone] ATTN: Malachy Mood. I did receive confirmation that the fax was successful.

## 2016-06-23 DIAGNOSIS — Z23 Encounter for immunization: Secondary | ICD-10-CM | POA: Diagnosis not present

## 2016-07-01 DIAGNOSIS — L97511 Non-pressure chronic ulcer of other part of right foot limited to breakdown of skin: Secondary | ICD-10-CM | POA: Diagnosis not present

## 2016-07-01 DIAGNOSIS — E1142 Type 2 diabetes mellitus with diabetic polyneuropathy: Secondary | ICD-10-CM | POA: Diagnosis not present

## 2016-07-13 NOTE — Pre-Procedure Instructions (Signed)
Keith Espinoza  07/13/2016     Your procedure is scheduled on : Friday July 23, 2016 at 7:30 AM.  Report to [facility] Admitting at 5:30 A.M.  Call this number if you have problems the morning of surgery: [phone]    Remember:  Do not eat food or drink liquids after midnight.  Take these medicines the morning of surgery with A SIP OF WATER : Amlodipine (Norvasc), Gabapentin (Neurontin), Isosorbide (Imdur)   Stop taking any vitamins, herbal medications/supplements, NSAIDs, Ibuprofen, Advil, Motrin, Aleve, etc on Friday October 13th   Please follow your physicians instructions regarding Plavix   Do NOT take any diabetic pills the morning of your surgery (NO Metformin/Glucophage NO Glimepiride/Amaryl)     How to Manage Your Diabetes Before and After Surgery  Why is it important to control my blood sugar before and after surgery? . Improving blood sugar levels before and after surgery helps healing and can limit problems. . A way of improving blood sugar control is eating a healthy diet by: o  Eating less sugar and carbohydrates o  Increasing activity/exercise o  Talking with your doctor about reaching your blood sugar goals . High blood sugars (greater than 180 mg/dL) can raise your risk of infections and slow your recovery, so you will need to focus on controlling your diabetes during the weeks before surgery. . Make sure that the doctor who takes care of your diabetes knows about your planned surgery including the date and location.  How do I manage my blood sugar before surgery? . Check your blood sugar at least 4 times a day, starting 2 days before surgery, to make sure that the level is not too high or low. o Check your blood sugar the morning of your surgery when you wake up and every 2 hours until you get to the Short Stay unit. . If your blood sugar is less than 70 mg/dL, you will need to treat for low blood sugar: o Do not take insulin. o Treat a low  blood sugar (less than 70 mg/dL) with  cup of clear juice (cranberry or apple), 4 glucose tablets, OR glucose gel. o Recheck blood sugar in 15 minutes after treatment (to make sure it is greater than 70 mg/dL). If your blood sugar is not greater than 70 mg/dL on recheck, call [phone] for further instructions. . Report your blood sugar to the short stay nurse when you get to Short Stay.  . If you are admitted to the hospital after surgery: o Your blood sugar will be checked by the staff and you will probably be given insulin after surgery (instead of oral diabetes medicines) to make sure you have good blood sugar levels. o The goal for blood sugar control after surgery is 80-180 mg/dL.      WHAT DO I DO ABOUT MY DIABETES MEDICATION?   Marland Kitchen Do not take oral diabetes medicines (pills) the morning of surgery.  . THE MORNING OF SURGERY, only take take 15-25 units of Levemir insulin.  Reviewed and Endorsed by [facility] Patient Education Committee, August 2015   Do not wear jewelry.  Do not wear lotions, powders, cologne, or deoderant.  Men may shave face and neck.  Do not bring valuables to the hospital.  [facility] is not responsible for any belongings or valuables.  Contacts, dentures or bridgework may not be worn into surgery.  Leave your suitcase in the car.  After surgery it may be brought to your  room.  For patients admitted to the hospital, discharge time will be determined by your treatment team.  Patients discharged the day of surgery will not be allowed to drive home.   Name and phone number of your driver:    Special instructions:  Shower using CHG soap the night before and the morning of your surgery  Please read over the following fact sheets that you were given.

## 2016-07-14 ENCOUNTER — Encounter (HOSPITAL_COMMUNITY): Payer: Self-pay

## 2016-07-14 ENCOUNTER — Ambulatory Visit (HOSPITAL_COMMUNITY)
Admission: RE | Admit: 2016-07-14 | Discharge: 2016-07-14 | Disposition: A | Discharge status: Home / Self Care | Payer: Medicare Other | Source: Ambulatory Visit | Attending: Surgery | Admitting: Surgery

## 2016-07-14 ENCOUNTER — Encounter (HOSPITAL_COMMUNITY)
Admission: RE | Admit: 2016-07-14 | Discharge: 2016-07-14 | Disposition: A | Discharge status: Home / Self Care | Payer: Medicare Other | Source: Ambulatory Visit | Attending: Orthopaedic Surgery | Admitting: Orthopaedic Surgery

## 2016-07-14 DIAGNOSIS — E119 Type 2 diabetes mellitus without complications: Secondary | ICD-10-CM | POA: Diagnosis not present

## 2016-07-14 DIAGNOSIS — Z87891 Personal history of nicotine dependence: Secondary | ICD-10-CM | POA: Diagnosis not present

## 2016-07-14 DIAGNOSIS — Z01818 Encounter for other preprocedural examination: Secondary | ICD-10-CM | POA: Insufficient documentation

## 2016-07-14 LAB — COMPREHENSIVE METABOLIC PANEL
ALT: 13 U/L — ABNORMAL LOW (ref 17–63)
AST: 17 U/L (ref 15–41)
Albumin: 4.2 g/dL (ref 3.5–5.0)
Alkaline Phosphatase: 67 U/L (ref 38–126)
Anion gap: 9 (ref 5–15)
BUN: 21 mg/dL — ABNORMAL HIGH (ref 6–20)
CO2: 25 mmol/L (ref 22–32)
Calcium: 9.9 mg/dL (ref 8.9–10.3)
Chloride: 108 mmol/L (ref 101–111)
Creatinine, Ser: 1.15 mg/dL (ref 0.61–1.24)
GFR calc Af Amer: >60 mL/min (ref >60)
GFR calc non Af Amer: 60 mL/min — ABNORMAL LOW (ref >60)
Glucose, Bld: 177 mg/dL — ABNORMAL HIGH (ref 65–99)
Potassium: 4 mmol/L (ref 3.5–5.1)
Sodium: 142 mmol/L (ref 135–145)
Total Bilirubin: 1.3 mg/dL — ABNORMAL HIGH (ref 0.3–1.2)
Total Protein: 6.8 g/dL (ref 6.5–8.1)

## 2016-07-14 LAB — TYPE AND SCREEN
ABO/RH(D): O POS
Antibody Screen: NEGATIVE

## 2016-07-14 LAB — CBC
HCT: 37.9 % — ABNORMAL LOW (ref 39.0–52.0)
Hemoglobin: 13.3 g/dL (ref 13.0–17.0)
MCH: 31.2 pg (ref 26.0–34.0)
MCHC: 35.1 g/dL (ref 30.0–36.0)
MCV: 89 fL (ref 78.0–100.0)
Platelets: 125 10*3/uL — ABNORMAL LOW (ref 150–400)
RBC: 4.26 MIL/uL (ref 4.22–5.81)
RDW: 14.3 % (ref 11.5–15.5)
WBC: 5.5 10*3/uL (ref 4.0–10.5)

## 2016-07-14 LAB — PROTIME-INR
INR: 1.04
Prothrombin Time: 13.6 seconds (ref 11.4–15.2)

## 2016-07-14 LAB — ABO/RH: ABO/RH(D): O POS

## 2016-07-14 LAB — SURGICAL PCR SCREEN
MRSA, PCR: NEGATIVE
Staphylococcus aureus: NEGATIVE

## 2016-07-14 LAB — GLUCOSE, CAPILLARY: Glucose-Capillary: 175 mg/dL — ABNORMAL HIGH (ref 65–99)

## 2016-07-14 LAB — APTT: aPTT: 28 seconds (ref 24–36)

## 2016-07-14 NOTE — Progress Notes (Signed)
Patient voided prior to PAT visit and was unable to provide a urine sample. Will need to obtain urinalysis STAT DOS.

## 2016-07-14 NOTE — Progress Notes (Signed)
PCP is [name]  Cardiologist is Dr. Irish Lack. LOV was approximately four or five months ago. Patient denied having any acute cardiac or pulmonary issues, but did inform Nurse that he has six stents placed in his heart.   CBG on arrival to PAT was 175, and patient denied any food consumption, but did inform Nurse that he drank one cup of coffee. Per patient his blood glucose typically ranges from 104 to 180. The result of his last A1c was unknown.   Nurse inquired about Plavix and patient stated he was instructed to stop Plavix and aspirin five days before surgery.  Will send chart to Anesthesia for review.

## 2016-07-15 LAB — HEMOGLOBIN A1C
Hgb A1c MFr Bld: 7 % — ABNORMAL HIGH (ref 4.8–5.6)
Mean Plasma Glucose: 154 mg/dL

## 2016-07-15 NOTE — Progress Notes (Addendum)
Anesthesia Chart Review:  Pt is a 75 year old male scheduled for L total knee arthroplasty on 07/23/2016 with Rodell Perna, MD.   - Cardiologist is Larae Grooms, MD, who has cleared pt for surgery.  - PCP is Henrine Screws, MD.   PMH includes:  CAD (RCA stent 2004; staged DES to LAD and Cx 2004;  DES to mRCA 2012; DES to mCx, PTCA to dCx 11/11/11; PTCA to distal Cx & DES to mid OM2 11/15/11), heart murmur, HTN, DM, thrombocytopenia, Gilbert syndrome. Former smoker. BMI 28  Medications include: amlodipine, ASA, plavix, glimepiride, levemir, imdur, metformin, potassium, crestor, telmisartan-hctz. Pt stopped plavix and ASA 7 days before surgery.   Preoperative labs reviewed.  HgbA1c 7.0, glucose 177  CXR 07/14/16: no active cardiopulmonary disease.  EKG 01/01/16: NSR  Nuclear stress test 04/06/14: Inferolateral scar. No findings for myocardial ischemia. Ejection fraction calculated at 57%  If no changes, I anticipate pt can proceed with surgery as scheduled.   Willeen Cass, FNP-BC [facility]/Anesthesiology Phone: [phone] 07/20/2016 3:57 PM

## 2016-07-16 ENCOUNTER — Telehealth: Payer: Self-pay | Admitting: Interventional Cardiology

## 2016-07-16 NOTE — Telephone Encounter (Signed)
I have faxed this message to Dr Lorin Mercy at [phone]. I did receive confirmation that the fax was successful.

## 2016-07-16 NOTE — Telephone Encounter (Signed)
OK to hold aspirin and plavix 7 days prior to surgery.

## 2016-07-16 NOTE — Telephone Encounter (Signed)
The pt is also taking Plavix 75 mg daily.  Please advise.

## 2016-07-16 NOTE — Telephone Encounter (Signed)
New message    Request for surgical clearance:  1. What type of surgery is being performed? Total knee replacement     2. When is this surgery scheduled? 10.20.2017   3. Are there any medications that need to be held prior to surgery and how long? Diona Fanti should it be continue   4. Name of physician performing surgery? Dr. Lorin Mercy   5. What is your office phone and fax number? [phone] / fax [phone]

## 2016-07-22 ENCOUNTER — Encounter (HOSPITAL_COMMUNITY): Payer: Self-pay | Admitting: Anesthesiology

## 2016-07-22 NOTE — Anesthesia Preprocedure Evaluation (Addendum)
Anesthesia Evaluation  Patient identified by MRN, date of birth, ID band Patient awake    Reviewed: Allergy & Precautions, NPO status , Patient's Chart, lab work & pertinent test results  Airway Mallampati: II  TM Distance: >3 FB Neck ROM: Full    Dental  (+) Lower Dentures, Upper Dentures   Pulmonary former smoker,    Pulmonary exam normal breath sounds clear to auscultation       Cardiovascular hypertension, Pt. on medications + CAD  Normal cardiovascular exam+ dysrhythmias + Valvular Problems/Murmurs  Rhythm:Regular Rate:Normal  a. Stenting to RCA 2004; staged DES to LAD and Cx 2004. DES to mRCA 2012. b. DES to mCx, PTCA to dCx 11/2011. c. Lateral wall MI 2013 s/p PTCA to distal Cx & DES to mid OM2 11/2011. d. Low risk nuc 04/2014, EF wnl.   Neuro/Psych Diabetic neuropathy negative psych ROS   GI/Hepatic Neg liver ROS, hiatal hernia, GERD  Medicated and Controlled,Gilbert syndrome Hx/o Pancreatic pseudocyst   Endo/Other  diabetes, Well Controlled, Type 2, Oral Hypoglycemic Agents, Insulin DependentGout  Renal/GU negative Renal ROS   BPH    Musculoskeletal  (+) Arthritis , OA left knee Chronic leg pain Previous back surgery x 3   Abdominal   Peds  Hematology Thrombocytopenia-mild   Anesthesia Other Findings   Reproductive/Obstetrics                             Chemistry      Component Value Date/Time   NA 142 07/14/2016 1100   K 4.0 07/14/2016 1100   CL 108 07/14/2016 1100   CO2 25 07/14/2016 1100   BUN 21 (H) 07/14/2016 1100   CREATININE 1.15 07/14/2016 1100      Component Value Date/Time   CALCIUM 9.9 07/14/2016 1100   ALKPHOS 67 07/14/2016 1100   AST 17 07/14/2016 1100   ALT 13 (L) 07/14/2016 1100   BILITOT 1.3 (H) 07/14/2016 1100     Lab Results  Component Value Date   WBC 5.5 07/14/2016   HGB 13.3 07/14/2016   HCT 37.9 (L) 07/14/2016   MCV 89.0 07/14/2016   PLT 125  (L) 07/14/2016   EKG: normal EKG, normal sinus rhythm. Nuclear stress test 04/06/14: Inferolateral scar. No findings for myocardial ischemia. Ejection fraction calculated at 57% Anesthesia Physical Anesthesia Plan  ASA: III  Anesthesia Plan: General and Regional   Post-op Pain Management:  Regional for Post-op pain   Induction: Intravenous  Airway Management Planned: Oral ETT  Additional Equipment:   Intra-op Plan:   Post-operative Plan: Extubation in OR  Informed Consent: I have reviewed the patients History and Physical, chart, labs and discussed the procedure including the risks, benefits and alternatives for the proposed anesthesia with the patient or authorized representative who has indicated his/her understanding and acceptance.   Dental advisory given  Plan Discussed with: Anesthesiologist, CRNA and Surgeon  Anesthesia Plan Comments:         Anesthesia Quick Evaluation

## 2016-07-23 ENCOUNTER — Inpatient Hospital Stay (HOSPITAL_COMMUNITY)
Admission: RE | Admit: 2016-07-23 | Discharge: 2016-07-27 | DRG: 470 | Disposition: A | Discharge status: Home Health | Payer: Medicare Other | Source: Ambulatory Visit | Attending: Orthopaedic Surgery | Admitting: Orthopaedic Surgery

## 2016-07-23 ENCOUNTER — Encounter (HOSPITAL_COMMUNITY)
Admission: RE | Disposition: A | Discharge status: Home Health | Payer: Self-pay | Source: Ambulatory Visit | Attending: Orthopaedic Surgery

## 2016-07-23 ENCOUNTER — Inpatient Hospital Stay (HOSPITAL_COMMUNITY): Payer: Medicare Other | Admitting: Anesthesiology

## 2016-07-23 ENCOUNTER — Inpatient Hospital Stay (HOSPITAL_COMMUNITY): Payer: Medicare Other | Admitting: Emergency Medicine

## 2016-07-23 ENCOUNTER — Encounter (HOSPITAL_COMMUNITY): Payer: Self-pay | Admitting: *Deleted

## 2016-07-23 ENCOUNTER — Inpatient Hospital Stay (HOSPITAL_COMMUNITY): Payer: Medicare Other

## 2016-07-23 DIAGNOSIS — Z471 Aftercare following joint replacement surgery: Secondary | ICD-10-CM | POA: Diagnosis not present

## 2016-07-23 DIAGNOSIS — I252 Old myocardial infarction: Secondary | ICD-10-CM

## 2016-07-23 DIAGNOSIS — E1140 Type 2 diabetes mellitus with diabetic neuropathy, unspecified: Secondary | ICD-10-CM | POA: Diagnosis not present

## 2016-07-23 DIAGNOSIS — M109 Gout, unspecified: Secondary | ICD-10-CM | POA: Diagnosis present

## 2016-07-23 DIAGNOSIS — Z955 Presence of coronary angioplasty implant and graft: Secondary | ICD-10-CM

## 2016-07-23 DIAGNOSIS — R4182 Altered mental status, unspecified: Secondary | ICD-10-CM | POA: Diagnosis not present

## 2016-07-23 DIAGNOSIS — K219 Gastro-esophageal reflux disease without esophagitis: Secondary | ICD-10-CM | POA: Diagnosis present

## 2016-07-23 DIAGNOSIS — D696 Thrombocytopenia, unspecified: Secondary | ICD-10-CM | POA: Diagnosis present

## 2016-07-23 DIAGNOSIS — Z7982 Long term (current) use of aspirin: Secondary | ICD-10-CM | POA: Diagnosis not present

## 2016-07-23 DIAGNOSIS — Z794 Long term (current) use of insulin: Secondary | ICD-10-CM | POA: Diagnosis not present

## 2016-07-23 DIAGNOSIS — Z7902 Long term (current) use of antithrombotics/antiplatelets: Secondary | ICD-10-CM

## 2016-07-23 DIAGNOSIS — D62 Acute posthemorrhagic anemia: Secondary | ICD-10-CM | POA: Diagnosis not present

## 2016-07-23 DIAGNOSIS — I2510 Atherosclerotic heart disease of native coronary artery without angina pectoris: Secondary | ICD-10-CM | POA: Diagnosis not present

## 2016-07-23 DIAGNOSIS — I10 Essential (primary) hypertension: Secondary | ICD-10-CM | POA: Diagnosis not present

## 2016-07-23 DIAGNOSIS — H02402 Unspecified ptosis of left eyelid: Secondary | ICD-10-CM

## 2016-07-23 DIAGNOSIS — M79609 Pain in unspecified limb: Secondary | ICD-10-CM | POA: Diagnosis not present

## 2016-07-23 DIAGNOSIS — E785 Hyperlipidemia, unspecified: Secondary | ICD-10-CM | POA: Diagnosis present

## 2016-07-23 DIAGNOSIS — Z09 Encounter for follow-up examination after completed treatment for conditions other than malignant neoplasm: Secondary | ICD-10-CM

## 2016-07-23 DIAGNOSIS — M25562 Pain in left knee: Secondary | ICD-10-CM | POA: Diagnosis not present

## 2016-07-23 DIAGNOSIS — Z96652 Presence of left artificial knee joint: Secondary | ICD-10-CM | POA: Diagnosis not present

## 2016-07-23 DIAGNOSIS — Z79899 Other long term (current) drug therapy: Secondary | ICD-10-CM | POA: Diagnosis not present

## 2016-07-23 DIAGNOSIS — G8918 Other acute postprocedural pain: Secondary | ICD-10-CM | POA: Diagnosis not present

## 2016-07-23 DIAGNOSIS — Z720 Tobacco use: Secondary | ICD-10-CM

## 2016-07-23 DIAGNOSIS — M1712 Unilateral primary osteoarthritis, left knee: Secondary | ICD-10-CM | POA: Diagnosis present

## 2016-07-23 DIAGNOSIS — M179 Osteoarthritis of knee, unspecified: Secondary | ICD-10-CM | POA: Diagnosis not present

## 2016-07-23 HISTORY — PX: TOTAL KNEE ARTHROPLASTY: SHX125

## 2016-07-23 LAB — GLUCOSE, CAPILLARY
Glucose-Capillary: 129 mg/dL — ABNORMAL HIGH (ref 65–99)
Glucose-Capillary: 189 mg/dL — ABNORMAL HIGH (ref 65–99)
Glucose-Capillary: 259 mg/dL — ABNORMAL HIGH (ref 65–99)
Glucose-Capillary: 238 mg/dL — ABNORMAL HIGH (ref 65–99)

## 2016-07-23 SURGERY — ARTHROPLASTY, KNEE, TOTAL
Anesthesia: Regional | Laterality: Left

## 2016-07-23 MED ORDER — METHOCARBAMOL 1000 MG/10ML IJ SOLN
500.0000 mg | Freq: Four times a day (QID) | INTRAVENOUS | Status: DC | PRN
  Administered 2016-07-23: 500 mg via INTRAVENOUS
  Filled 2016-07-23 (×2): qty 5

## 2016-07-23 MED ORDER — METOCLOPRAMIDE HCL 5 MG PO TABS: 5.0000 mg | ORAL_TABLET | Freq: Three times a day (TID) | ORAL | Status: DC | PRN

## 2016-07-23 MED ORDER — AMLODIPINE BESYLATE 5 MG PO TABS
5.0000 mg | ORAL_TABLET | Freq: Every day | ORAL | Status: DC
  Administered 2016-07-24 – 2016-07-27 (×4): 5 mg via ORAL
  Filled 2016-07-23 (×4): qty 1

## 2016-07-23 MED ORDER — METOCLOPRAMIDE HCL 5 MG/ML IJ SOLN
5.0000 mg | Freq: Three times a day (TID) | INTRAMUSCULAR | Status: DC | PRN
Start: 2016-07-23 — End: 2016-07-27

## 2016-07-23 MED ORDER — ASPIRIN EC 325 MG PO TBEC
325.0000 mg | DELAYED_RELEASE_TABLET | Freq: Every day | ORAL | Status: DC
  Administered 2016-07-24 – 2016-07-27 (×5): 325 mg via ORAL
  Filled 2016-07-23 (×5): qty 1

## 2016-07-23 MED ORDER — PROPOFOL 10 MG/ML IV BOLUS
INTRAVENOUS | Status: AC
  Filled 2016-07-23: qty 40

## 2016-07-23 MED ORDER — MIDAZOLAM HCL 5 MG/5ML IJ SOLN
INTRAMUSCULAR | Status: DC | PRN
  Administered 2016-07-23: 0.5 mg via INTRAVENOUS

## 2016-07-23 MED ORDER — HYDROMORPHONE HCL 2 MG/ML IJ SOLN
0.5000 mg | INTRAMUSCULAR | Status: DC | PRN
  Administered 2016-07-23 – 2016-07-24 (×3): 0.5 mg via INTRAVENOUS
  Filled 2016-07-23 (×3): qty 1

## 2016-07-23 MED ORDER — OXYCODONE HCL 5 MG PO TABS
5.0000 mg | ORAL_TABLET | Freq: Four times a day (QID) | ORAL | Status: DC | PRN
  Administered 2016-07-23 – 2016-07-24 (×3): 10 mg via ORAL
  Filled 2016-07-23 (×3): qty 2

## 2016-07-23 MED ORDER — TERAZOSIN HCL 5 MG PO CAPS
5.0000 mg | ORAL_CAPSULE | Freq: Every day | ORAL | Status: DC
  Administered 2016-07-23 – 2016-07-26 (×4): 5 mg via ORAL
  Filled 2016-07-23 (×4): qty 1

## 2016-07-23 MED ORDER — PHENOL 1.4 % MT LIQD: 1.0000 | OROMUCOSAL | Status: DC | PRN

## 2016-07-23 MED ORDER — ISOSORBIDE MONONITRATE ER 30 MG PO TB24
30.0000 mg | ORAL_TABLET | Freq: Every day | ORAL | Status: DC
  Administered 2016-07-24 – 2016-07-27 (×4): 30 mg via ORAL
  Filled 2016-07-23 (×4): qty 1

## 2016-07-23 MED ORDER — TELMISARTAN-HCTZ 80-12.5 MG PO TABS: 1.0000 | ORAL_TABLET | Freq: Every day | ORAL | Status: DC

## 2016-07-23 MED ORDER — LIDOCAINE HCL (CARDIAC) 20 MG/ML IV SOLN: INTRAVENOUS | Status: DC | PRN

## 2016-07-23 MED ORDER — MENTHOL 3 MG MT LOZG: 1.0000 | LOZENGE | OROMUCOSAL | Status: DC | PRN

## 2016-07-23 MED ORDER — EPHEDRINE SULFATE 50 MG/ML IJ SOLN
INTRAMUSCULAR | Status: DC | PRN
  Administered 2016-07-23: 10 mg via INTRAVENOUS
  Administered 2016-07-23: 20 mg via INTRAVENOUS

## 2016-07-23 MED ORDER — ROSUVASTATIN CALCIUM 10 MG PO TABS
10.0000 mg | ORAL_TABLET | Freq: Every day | ORAL | Status: DC
  Administered 2016-07-23 – 2016-07-26 (×2): 10 mg via ORAL
  Filled 2016-07-23 (×5): qty 1

## 2016-07-23 MED ORDER — DOCUSATE SODIUM 100 MG PO CAPS
100.0000 mg | ORAL_CAPSULE | Freq: Two times a day (BID) | ORAL | Status: DC
Start: 2016-07-23 — End: 2016-07-27
  Administered 2016-07-23 – 2016-07-27 (×8): 100 mg via ORAL
  Filled 2016-07-23 (×9): qty 1

## 2016-07-23 MED ORDER — ONDANSETRON HCL 4 MG/2ML IJ SOLN
INTRAMUSCULAR | Status: DC | PRN
  Administered 2016-07-23: 4 mg via INTRAVENOUS

## 2016-07-23 MED ORDER — HYDROMORPHONE HCL 2 MG/ML IJ SOLN
0.2500 mg | INTRAMUSCULAR | Status: DC | PRN
  Administered 2016-07-23: 0.5 mg via INTRAVENOUS

## 2016-07-23 MED ORDER — FENTANYL CITRATE (PF) 100 MCG/2ML IJ SOLN
INTRAMUSCULAR | Status: AC
  Filled 2016-07-23: qty 2

## 2016-07-23 MED ORDER — ALLOPURINOL 300 MG PO TABS
300.0000 mg | ORAL_TABLET | Freq: Every day | ORAL | Status: DC
  Administered 2016-07-23 – 2016-07-27 (×5): 300 mg via ORAL
  Filled 2016-07-23 (×5): qty 1

## 2016-07-23 MED ORDER — BUPIVACAINE HCL (PF) 0.25 % IJ SOLN
INTRAMUSCULAR | Status: DC | PRN
  Administered 2016-07-23: 30 mL

## 2016-07-23 MED ORDER — HYDROCHLOROTHIAZIDE 12.5 MG PO CAPS
12.5000 mg | ORAL_CAPSULE | Freq: Every day | ORAL | Status: DC
  Administered 2016-07-23 – 2016-07-27 (×5): 12.5 mg via ORAL
  Filled 2016-07-23 (×5): qty 1

## 2016-07-23 MED ORDER — ONDANSETRON HCL 4 MG/2ML IJ SOLN: 4.0000 mg | Freq: Four times a day (QID) | INTRAMUSCULAR | Status: DC | PRN

## 2016-07-23 MED ORDER — BUPIVACAINE-EPINEPHRINE (PF) 0.5% -1:200000 IJ SOLN
INTRAMUSCULAR | Status: DC | PRN
  Administered 2016-07-23: 30 mL via PERINEURAL

## 2016-07-23 MED ORDER — CEFAZOLIN IN D5W 1 GM/50ML IV SOLN
1.0000 g | Freq: Three times a day (TID) | INTRAVENOUS | Status: AC
  Administered 2016-07-23 (×2): 1 g via INTRAVENOUS
  Filled 2016-07-23 (×3): qty 50

## 2016-07-23 MED ORDER — METFORMIN HCL 500 MG PO TABS
1000.0000 mg | ORAL_TABLET | Freq: Two times a day (BID) | ORAL | Status: DC
  Administered 2016-07-23 – 2016-07-27 (×8): 1000 mg via ORAL
  Filled 2016-07-23 (×8): qty 2

## 2016-07-23 MED ORDER — 0.9 % SODIUM CHLORIDE (POUR BTL) OPTIME
TOPICAL | Status: DC | PRN
  Administered 2016-07-23: 1000 mL

## 2016-07-23 MED ORDER — POTASSIUM CHLORIDE CRYS ER 20 MEQ PO TBCR
20.0000 meq | EXTENDED_RELEASE_TABLET | Freq: Every day | ORAL | Status: DC
  Administered 2016-07-23 – 2016-07-27 (×5): 20 meq via ORAL
  Filled 2016-07-23 (×5): qty 1

## 2016-07-23 MED ORDER — LACTATED RINGERS IV SOLN
INTRAVENOUS | Status: DC | PRN
  Administered 2016-07-23 (×2): via INTRAVENOUS

## 2016-07-23 MED ORDER — LIDOCAINE HCL (CARDIAC) 20 MG/ML IV SOLN
INTRAVENOUS | Status: DC | PRN
  Administered 2016-07-23: 60 mg via INTRAVENOUS

## 2016-07-23 MED ORDER — IRBESARTAN 300 MG PO TABS
300.0000 mg | ORAL_TABLET | Freq: Every day | ORAL | Status: DC
  Administered 2016-07-23 – 2016-07-27 (×5): 300 mg via ORAL
  Filled 2016-07-23 (×5): qty 1

## 2016-07-23 MED ORDER — GLIMEPIRIDE 4 MG PO TABS
4.0000 mg | ORAL_TABLET | Freq: Two times a day (BID) | ORAL | Status: DC
  Administered 2016-07-23 – 2016-07-27 (×8): 4 mg via ORAL
  Filled 2016-07-23 (×9): qty 1

## 2016-07-23 MED ORDER — CHLORHEXIDINE GLUCONATE 4 % EX LIQD: 60.0000 mL | Freq: Once | CUTANEOUS | Status: DC

## 2016-07-23 MED ORDER — POLYETHYLENE GLYCOL 3350 17 G PO PACK
17.0000 g | PACK | Freq: Every day | ORAL | Status: DC | PRN
  Administered 2016-07-25 – 2016-07-27 (×2): 17 g via ORAL
  Filled 2016-07-23 (×2): qty 1

## 2016-07-23 MED ORDER — CEFAZOLIN SODIUM-DEXTROSE 2-4 GM/100ML-% IV SOLN
INTRAVENOUS | Status: AC
  Filled 2016-07-23: qty 100

## 2016-07-23 MED ORDER — CEFAZOLIN SODIUM-DEXTROSE 2-4 GM/100ML-% IV SOLN
2.0000 g | INTRAVENOUS | Status: AC
  Administered 2016-07-23: 2 g via INTRAVENOUS

## 2016-07-23 MED ORDER — ACETAMINOPHEN 325 MG PO TABS: 650.0000 mg | ORAL_TABLET | Freq: Four times a day (QID) | ORAL | Status: DC | PRN

## 2016-07-23 MED ORDER — PHENYLEPHRINE HCL 10 MG/ML IJ SOLN
INTRAMUSCULAR | Status: DC | PRN
  Administered 2016-07-23: 80 ug via INTRAVENOUS

## 2016-07-23 MED ORDER — NITROGLYCERIN 0.4 MG SL SUBL: 0.4000 mg | SUBLINGUAL_TABLET | SUBLINGUAL | Status: DC | PRN

## 2016-07-23 MED ORDER — ACETAMINOPHEN 650 MG RE SUPP: 650.0000 mg | Freq: Four times a day (QID) | RECTAL | Status: DC | PRN

## 2016-07-23 MED ORDER — SODIUM CHLORIDE 0.9 % IR SOLN
Status: DC | PRN
  Administered 2016-07-23: 3000 mL

## 2016-07-23 MED ORDER — ONDANSETRON HCL 4 MG PO TABS: 4.0000 mg | ORAL_TABLET | Freq: Four times a day (QID) | ORAL | Status: DC | PRN

## 2016-07-23 MED ORDER — BUPIVACAINE HCL (PF) 0.25 % IJ SOLN
INTRAMUSCULAR | Status: AC
  Filled 2016-07-23: qty 30

## 2016-07-23 MED ORDER — MEPERIDINE HCL 25 MG/ML IJ SOLN
6.2500 mg | INTRAMUSCULAR | Status: DC | PRN
Start: 2016-07-23 — End: 2016-07-23

## 2016-07-23 MED ORDER — CLOPIDOGREL BISULFATE 75 MG PO TABS
75.0000 mg | ORAL_TABLET | Freq: Every day | ORAL | Status: DC
  Administered 2016-07-23 – 2016-07-27 (×5): 75 mg via ORAL
  Filled 2016-07-23 (×5): qty 1

## 2016-07-23 MED ORDER — INSULIN DETEMIR 100 UNIT/ML ~~LOC~~ SOLN
20.0000 [IU] | Freq: Every day | SUBCUTANEOUS | Status: DC
  Administered 2016-07-24 – 2016-07-27 (×4): 20 [IU] via SUBCUTANEOUS
  Filled 2016-07-23 (×4): qty 0.2

## 2016-07-23 MED ORDER — MIDAZOLAM HCL 2 MG/2ML IJ SOLN
INTRAMUSCULAR | Status: AC
  Filled 2016-07-23: qty 2

## 2016-07-23 MED ORDER — SODIUM CHLORIDE 0.9 % IV SOLN
INTRAVENOUS | Status: DC
Start: 2016-07-23 — End: 2016-07-27
  Administered 2016-07-23 (×2): via INTRAVENOUS

## 2016-07-23 MED ORDER — HYDROMORPHONE HCL 2 MG/ML IJ SOLN
INTRAMUSCULAR | Status: AC
  Filled 2016-07-23: qty 1

## 2016-07-23 MED ORDER — PROPOFOL 10 MG/ML IV BOLUS
INTRAVENOUS | Status: DC | PRN
  Administered 2016-07-23: 50 mg via INTRAVENOUS
  Administered 2016-07-23: 100 mg via INTRAVENOUS
  Administered 2016-07-23: 20 mg via INTRAVENOUS

## 2016-07-23 MED ORDER — GABAPENTIN 300 MG PO CAPS
300.0000 mg | ORAL_CAPSULE | Freq: Two times a day (BID) | ORAL | Status: DC
  Administered 2016-07-23 – 2016-07-27 (×8): 300 mg via ORAL
  Filled 2016-07-23 (×9): qty 1

## 2016-07-23 MED ORDER — PROMETHAZINE HCL 25 MG/ML IJ SOLN: 6.2500 mg | INTRAMUSCULAR | Status: DC | PRN

## 2016-07-23 MED ORDER — FENTANYL CITRATE (PF) 100 MCG/2ML IJ SOLN
INTRAMUSCULAR | Status: DC | PRN
  Administered 2016-07-23 (×3): 25 ug via INTRAVENOUS
  Administered 2016-07-23: 50 ug via INTRAVENOUS
  Administered 2016-07-23 (×3): 25 ug via INTRAVENOUS

## 2016-07-23 MED ORDER — METHOCARBAMOL 500 MG PO TABS
500.0000 mg | ORAL_TABLET | Freq: Four times a day (QID) | ORAL | Status: DC | PRN
  Administered 2016-07-23 – 2016-07-25 (×4): 500 mg via ORAL
  Filled 2016-07-23 (×5): qty 1

## 2016-07-23 SURGICAL SUPPLY — 75 items
APL SKNCLS STERI-STRIP NONHPOA (GAUZE/BANDAGES/DRESSINGS) ×1
BANDAGE ACE 4X5 VEL STRL LF (GAUZE/BANDAGES/DRESSINGS) ×2 IMPLANT
BANDAGE ELASTIC 6 VELCRO ST LF (GAUZE/BANDAGES/DRESSINGS) ×1 IMPLANT
BANDAGE ESMARK 6X9 LF (GAUZE/BANDAGES/DRESSINGS) ×1 IMPLANT
BENZOIN TINCTURE PRP APPL 2/3 (GAUZE/BANDAGES/DRESSINGS) ×2 IMPLANT
BLADE SAGITTAL 25.0X1.19X90 (BLADE) ×2 IMPLANT
BLADE SAW SGTL 13X75X1.27 (BLADE) ×2 IMPLANT
BNDG CMPR 9X6 STRL LF SNTH (GAUZE/BANDAGES/DRESSINGS) ×1
BNDG CMPR MED 10X6 ELC LF (GAUZE/BANDAGES/DRESSINGS) ×1
BNDG ELASTIC 6X10 VLCR STRL LF (GAUZE/BANDAGES/DRESSINGS) ×2 IMPLANT
BNDG ESMARK 6X9 LF (GAUZE/BANDAGES/DRESSINGS) ×2
BOWL SMART MIX CTS (DISPOSABLE) ×2 IMPLANT
CAPT KNEE TOTAL 3 ATTUNE ×1 IMPLANT
CEMENT HV SMART SET (Cement) ×4 IMPLANT
CLSR STERI-STRIP ANTIMIC 1/2X4 (GAUZE/BANDAGES/DRESSINGS) ×1 IMPLANT
COVER SURGICAL LIGHT HANDLE (MISCELLANEOUS) ×2 IMPLANT
CUFF TOURNIQUET SINGLE 34IN LL (TOURNIQUET CUFF) ×2 IMPLANT
CUFF TOURNIQUET SINGLE 44IN (TOURNIQUET CUFF) IMPLANT
DRAPE ORTHO SPLIT 77X108 STRL (DRAPES) ×6
DRAPE SURG ORHT 6 SPLT 77X108 (DRAPES) ×2 IMPLANT
DRAPE U-SHAPE 47X51 STRL (DRAPES) ×2 IMPLANT
DRSG PAD ABDOMINAL 8X10 ST (GAUZE/BANDAGES/DRESSINGS) ×2 IMPLANT
DURAPREP 26ML APPLICATOR (WOUND CARE) ×2 IMPLANT
ELECT REM PT RETURN 9FT ADLT (ELECTROSURGICAL) ×2
ELECTRODE REM PT RTRN 9FT ADLT (ELECTROSURGICAL) ×1 IMPLANT
EVACUATOR 1/8 PVC DRAIN (DRAIN) IMPLANT
FACESHIELD WRAPAROUND (MASK) ×2 IMPLANT
FACESHIELD WRAPAROUND OR TEAM (MASK) ×1 IMPLANT
GAUZE SPONGE 4X4 12PLY STRL (GAUZE/BANDAGES/DRESSINGS) ×2 IMPLANT
GAUZE XEROFORM 5X9 LF (GAUZE/BANDAGES/DRESSINGS) ×2 IMPLANT
GLOVE BIOGEL PI IND STRL 8 (GLOVE) ×2 IMPLANT
GLOVE BIOGEL PI INDICATOR 8 (GLOVE) ×2
GLOVE ORTHO TXT STRL SZ7.5 (GLOVE) ×4 IMPLANT
GOWN STRL REUS W/ TWL LRG LVL3 (GOWN DISPOSABLE) ×1 IMPLANT
GOWN STRL REUS W/ TWL XL LVL3 (GOWN DISPOSABLE) ×1 IMPLANT
GOWN STRL REUS W/TWL 2XL LVL3 (GOWN DISPOSABLE) ×2 IMPLANT
GOWN STRL REUS W/TWL LRG LVL3 (GOWN DISPOSABLE) ×2
GOWN STRL REUS W/TWL XL LVL3 (GOWN DISPOSABLE) ×2
HANDPIECE INTERPULSE COAX TIP (DISPOSABLE) ×2
IMMOBILIZER KNEE 22 (SOFTGOODS) ×1 IMPLANT
IMMOBILIZER KNEE 22 UNIV (SOFTGOODS) ×2 IMPLANT
KIT BASIN OR (CUSTOM PROCEDURE TRAY) ×2 IMPLANT
KIT ROOM TURNOVER OR (KITS) ×2 IMPLANT
MANIFOLD NEPTUNE II (INSTRUMENTS) ×2 IMPLANT
MARKER SKIN DUAL TIP RULER LAB (MISCELLANEOUS) ×2 IMPLANT
NDL HYPO 25GX1X1/2 BEV (NEEDLE) ×1 IMPLANT
NEEDLE HYPO 25GX1X1/2 BEV (NEEDLE) ×2 IMPLANT
NS IRRIG 1000ML POUR BTL (IV SOLUTION) ×2 IMPLANT
PACK TOTAL JOINT (CUSTOM PROCEDURE TRAY) ×2 IMPLANT
PACK UNIVERSAL I (CUSTOM PROCEDURE TRAY) ×2 IMPLANT
PAD ARMBOARD 7.5X6 YLW CONV (MISCELLANEOUS) ×4 IMPLANT
PAD CAST 4YDX4 CTTN HI CHSV (CAST SUPPLIES) ×1 IMPLANT
PADDING CAST ABS 4INX4YD NS (CAST SUPPLIES) ×1
PADDING CAST ABS COTTON 4X4 ST (CAST SUPPLIES) IMPLANT
PADDING CAST COTTON 4X4 STRL (CAST SUPPLIES) ×2
PADDING CAST COTTON 6X4 STRL (CAST SUPPLIES) ×2 IMPLANT
SET HNDPC FAN SPRY TIP SCT (DISPOSABLE) ×1 IMPLANT
SPONGE GAUZE 4X4 12PLY STER LF (GAUZE/BANDAGES/DRESSINGS) ×1 IMPLANT
STAPLER VISISTAT 35W (STAPLE) ×2 IMPLANT
STRIP CLOSURE SKIN 1/2X4 (GAUZE/BANDAGES/DRESSINGS) ×4 IMPLANT
SUCTION FRAZIER HANDLE 10FR (MISCELLANEOUS) ×1
SUCTION TUBE FRAZIER 10FR DISP (MISCELLANEOUS) ×1 IMPLANT
SUT VIC AB 0 CT1 27 (SUTURE) ×2
SUT VIC AB 0 CT1 27XBRD ANBCTR (SUTURE) IMPLANT
SUT VIC AB 1 CT1 27 (SUTURE) ×2
SUT VIC AB 1 CT1 27XBRD ANBCTR (SUTURE) IMPLANT
SUT VIC AB 1 CTX 18 (SUTURE) ×4 IMPLANT
SUT VIC AB 2-0 CT1 27 (SUTURE) ×6
SUT VIC AB 2-0 CT1 TAPERPNT 27 (SUTURE) ×2 IMPLANT
SUT VIC AB 3-0 X1 27 (SUTURE) ×2 IMPLANT
SYR CONTROL 10ML LL (SYRINGE) ×2 IMPLANT
TOWEL OR 17X24 6PK STRL BLUE (TOWEL DISPOSABLE) ×2 IMPLANT
TOWEL OR 17X26 10 PK STRL BLUE (TOWEL DISPOSABLE) ×2 IMPLANT
TRAY FOLEY CATH 16FRSI W/METER (SET/KITS/TRAYS/PACK) ×2 IMPLANT
WATER STERILE IRR 1000ML POUR (IV SOLUTION) ×4 IMPLANT

## 2016-07-23 NOTE — Op Note (Signed)
NAMEJOSEHUA, LITALIEN NO.:  [id number]  MEDICAL RECORD NO.:  [id number]  LOCATION:  [facility]                         FACILITY:  [facility]  PHYSICIAN:  Mark C. Lorin Mercy, M.D.    DATE OF BIRTH:  02-23-1941  DATE OF PROCEDURE:  07/23/2016 DATE OF DISCHARGE:                              OPERATIVE REPORT   PREOPERATIVE DIAGNOSIS:  Left knee osteoarthritis, (primary).  POSTOPERATIVE DIAGNOSIS:  Left knee osteoarthritis, (primary).  PROCEDURE:  Left total knee arthroplasty.  SURGEON:  Mark C. Lorin Mercy, M.D.  ASSISTANT:  Alyson Locket. Ricard Dillon, PA-C, medically necessary and present for the entire procedure.  ANESTHESIA:  General.  ESTIMATED BLOOD LOSS:  Less than 200.  TOURNIQUET TIME:  Less than an hour.  IMPLANTS:  DePuy Attune #7 femur, 38 mm patella, #7 tibia, 5 mm posterior stabilizer rotating platform.  DESCRIPTION OF PROCEDURE:  After induction of general anesthesia, standard prepping and draping, preoperative Ancef prophylaxis.  Leg was elevated, wrapped with an Esmarch, tourniquet inflated after time-out procedure.  A midline incision was made.  Superficial retinaculum was split.  Deep retinaculum was marked with a skin marker and then cut splitting the patellar tendon between the medial one-third and lateral two thirds.  Patella was flipped over and cut from facet to facet removing 10 mm of bone.  Intramedullary hole was made in the femur, cuts were made distal cuts, AP on the tibia and then spacer blocks checking with full extension and good balance.  There were some medial spurs on the femur that had to be removed.  There was three compartment grade 4 chondromalacia present with area of eburnated polished bone.  ACL and PCL were resected.  Box cut was made on the femur.  Keel preparation on the tibia.  After pulse lavage, drilling of the lug holes both the femur and for the patella for 38.  Pulsatile lavage was used.  Vacuum mixing of the cement, cementing of the  tibia.  Femur insertion of the poly and then the patellar component held for 15 minutes until the cement was hard.  All excess cement was removed.  Knee reached full extension. There was good collateral balance.  Good patellar tracking.  Tourniquet deflation, hemostasis with Bovie electrocautery.  Standard closure with #1 Vicryl on the deep capsule, 2 on the subcutaneous tissue subcuticular closure, postop dressing, knee immobilizer.  Instrument count and needle count were correct.     Mark C. Lorin Mercy, M.D.     MCY/MEDQ  D:  07/23/2016  T:  07/23/2016  Job:  [id number]

## 2016-07-23 NOTE — Evaluation (Signed)
Physical Therapy Evaluation Patient Details Name: Keith Espinoza MRN: [id number] DOB: 1940/11/20 Today's Date: 07/23/2016   History of Present Illness  Pt is a 75 y.o. male now s/p Lt TKA. PMH: HTN, gout, neuropathy, diabetes, CAD, Chronic low back pain, Rt THA, back surgery.   Clinical Impression  Pt is s/p TKA resulting in the deficits listed below (see PT Problem List). Pt able to ambulate 15 ft with rw and min guard assist for safety. Multiple cues provided for safety throughout session with pt being impulsive at times.  Pt will benefit from skilled PT to increase their independence and safety with mobility to allow discharge to home with family support.       Follow Up Recommendations Home health PT;Supervision for mobility/OOB    Equipment Recommendations  Rolling walker with 5" wheels    Recommendations for Other Services       Precautions / Restrictions Precautions Precautions: Fall;Knee Precaution Booklet Issued: Yes (comment) Precaution Comments: HEP provided, reviewed knee extension precautions Required Braces or Orthoses: Knee Immobilizer - Left Knee Immobilizer - Left: On except when in CPM or PT Restrictions Weight Bearing Restrictions: Yes LLE Weight Bearing: Weight bearing as tolerated      Mobility  Bed Mobility Overal bed mobility: Needs Assistance Bed Mobility: Supine to Sit     Supine to sit: Supervision;HOB elevated     General bed mobility comments: pt using rail to get to sitting EOB  Transfers Overall transfer level: Needs assistance Equipment used: Rolling walker (2 wheeled) Transfers: Sit to/from Stand Sit to Stand: Min guard         General transfer comment: cues for hand placement and to fully turn before sitting.   Ambulation/Gait Ambulation/Gait assistance: Min guard Ambulation Distance (Feet): 15 Feet Assistive device: Rolling walker (2 wheeled) Gait Pattern/deviations: Step-to pattern;Decreased weight shift to left;Decreased  stance time - left Gait velocity: decreased   General Gait Details: Cues for gait sequence  Stairs            Wheelchair Mobility    Modified Rankin (Stroke Patients Only)       Balance Overall balance assessment: Needs assistance Sitting-balance support: No upper extremity supported Sitting balance-Leahy Scale: Good     Standing balance support: No upper extremity supported;During functional activity Standing balance-Leahy Scale: Fair Standing balance comment: able to stand static without UE support                             Pertinent Vitals/Pain Pain Assessment: 0-10 Pain Score: 8  Pain Location: Lt knee Pain Descriptors / Indicators: Aching Pain Intervention(s): Limited activity within patient's tolerance;Monitored during session    Home Living Family/patient expects to be discharged to:: Private residence Living Arrangements: Spouse/significant other Available Help at Discharge: Family;Available 24 hours/day Type of Home: House Home Access: Level entry     Home Layout: One level Home Equipment: Bedside commode;Tub bench Additional Comments: daughter will be staying with pt for first week.     Prior Function Level of Independence: Independent               Hand Dominance        Extremity/Trunk Assessment   Upper Extremity Assessment: Overall WFL for tasks assessed           Lower Extremity Assessment: LLE deficits/detail   LLE Deficits / Details: unable to perform SLR     Communication   Communication: No difficulties  Cognition Arousal/Alertness:  Awake/alert Behavior During Therapy: Impulsive Overall Cognitive Status: Within Functional Limits for tasks assessed                      General Comments      Exercises     Assessment/Plan    PT Assessment Patient needs continued PT services  PT Problem List Decreased strength;Decreased range of motion;Decreased activity tolerance;Decreased  balance;Decreased mobility          PT Treatment Interventions DME instruction;Gait training;Functional mobility training;Stair training;Therapeutic activities;Therapeutic exercise;Balance training;Patient/family education    PT Goals (Current goals can be found in the Care Plan section)  Acute Rehab PT Goals Patient Stated Goal: get home PT Goal Formulation: With patient Time For Goal Achievement: 08/06/16 Potential to Achieve Goals: Good    Frequency 7X/week   Barriers to discharge        Co-evaluation               End of Session Equipment Utilized During Treatment: Gait belt;Left knee immobilizer Activity Tolerance: Patient tolerated treatment well Patient left: in chair;with call bell/phone within reach;with family/visitor present Nurse Communication: Mobility status;Weight bearing status         Time: [id number] PT Time Calculation (min) (ACUTE ONLY): 34 min   Charges:   PT Evaluation $PT Eval Moderate Complexity: 1 Procedure PT Treatments $Gait Training: 8-22 mins   PT G Codes:        Cassell Clement, PT, CSCS Pager [phone] Office [phone]  07/23/2016, 3:54 PM

## 2016-07-23 NOTE — Progress Notes (Signed)
Orthopedic Tech Progress Note Patient Details:  Keith Espinoza 04/15/1941 [id number]  CPM Left Knee CPM Left Knee: On Left Knee Flexion (Degrees): 90 Left Knee Extension (Degrees): 0 Additional Comments: trapeze bar patien5tm helper   Hildred Priest 07/23/2016, 11:10 AM Viewed order from doctor's order list

## 2016-07-23 NOTE — Brief Op Note (Signed)
07/23/2016  9:53 AM  PATIENT:  [name]  75 y.o. male  PRE-OPERATIVE DIAGNOSIS:  Osteoarthritis Left Knee  POST-OPERATIVE DIAGNOSIS:  Osteoarthritis Left Knee  PROCEDURE:  Procedure(s): LEFT TOTAL KNEE ARTHROPLASTY (Left)  SURGEON:  Surgeon(s) and Role:    * Marybelle Killings, MD - Primary  PHYSICIAN ASSISTANT: Benjiman Core pa-c    ANESTHESIA:   general  EBL:  Total I/O In: 1200 [I.V.:1200] Out: 75 [Blood:75]  BLOOD ADMINISTERED:none  DRAINS: none   LOCAL MEDICATIONS USED:  MARCAINE     SPECIMEN:  No Specimen  DISPOSITION OF SPECIMEN:  N/A  COUNTS:  YES  TOURNIQUET:   Total Tourniquet Time Documented: Thigh (Left) - 58 minutes Total: Thigh (Left) - 58 minutes   DICTATION: .Viviann Spare Dictation  PLAN OF CARE: Admit to inpatient   PATIENT DISPOSITION:  PACU - hemodynamically stable.

## 2016-07-23 NOTE — Transfer of Care (Signed)
Immediate Anesthesia Transfer of Care Note  Patient: Keith Espinoza  Procedure(s) Performed: Procedure(s): LEFT TOTAL KNEE ARTHROPLASTY (Left)  Patient Location: PACU  Anesthesia Type:General and Regional  Level of Consciousness: awake, alert , oriented and sedated  Airway & Oxygen Therapy: Patient Spontanous Breathing and Patient connected to face mask oxygen  Post-op Assessment: Report given to RN, Post -op Vital signs reviewed and stable and Patient moving all extremities  Post vital signs: Reviewed and stable  Last Vitals:  Vitals:   07/23/16 0631 07/23/16 0940  BP: 136/61   Pulse: 62   Resp: 18   Temp: 36.4 C (P) 36.2 C    Last Pain:  Vitals:   07/23/16 0631  TempSrc: Oral  PainSc:       Patients Stated Pain Goal: 5 ([id number])  Complications: No apparent anesthesia complications

## 2016-07-23 NOTE — Anesthesia Procedure Notes (Signed)
Anesthesia Regional Block:  Adductor canal block  Pre-Anesthetic Checklist: ,, timeout performed, Correct Patient, Correct Site, Correct Laterality, Correct Procedure, Correct Position, site marked, Risks and benefits discussed,  Surgical consent,  Pre-op evaluation,  At surgeon's request and post-op pain management  Laterality: Left  Prep: chloraprep       Needles:  Injection technique: Single-shot  Needle Type: Echogenic Stimulator Needle     Needle Length: 9cm 9 cm Needle Gauge: 21 and 21 G  Needle insertion depth: 4 cm   Additional Needles:  Procedures: ultrasound guided (picture in chart) Adductor canal block Narrative:  Injection made incrementally with aspirations every 5 mL.  Performed by: Personally  Anesthesiologist: Josephine Igo  Additional Notes: Relevant anatomy ID'd by [place]. Incremental 19ml injection with frequent aspiration. Patient tolerated procedure well.

## 2016-07-23 NOTE — Anesthesia Procedure Notes (Deleted)
Anesthesia Regional Block: Narrative:       

## 2016-07-23 NOTE — Anesthesia Procedure Notes (Signed)
Procedure Name: LMA Insertion Date/Time: 07/23/2016 7:45 AM Performed by: Scheryl Darter Pre-anesthesia Checklist: Patient identified, Emergency Drugs available, Suction available and Patient being monitored Patient Re-evaluated:Patient Re-evaluated prior to inductionOxygen Delivery Method: Circle System Utilized Preoxygenation: Pre-oxygenation with 100% oxygen Intubation Type: IV induction Ventilation: Mask ventilation without difficulty LMA: LMA inserted LMA Size: 4.0 Number of attempts: 1 Placement Confirmation: positive ETCO2 Tube secured with: Tape Dental Injury: Teeth and Oropharynx as per pre-operative assessment

## 2016-07-23 NOTE — Anesthesia Postprocedure Evaluation (Signed)
Anesthesia Post Note  Patient: Keith Espinoza  Procedure(s) Performed: Procedure(s) (LRB): LEFT TOTAL KNEE ARTHROPLASTY (Left)  Patient location during evaluation: PACU Anesthesia Type: General and Regional Level of consciousness: awake and alert and oriented Pain management: satisfactory to patient Vital Signs Assessment: post-procedure vital signs reviewed and stable Respiratory status: spontaneous breathing, nonlabored ventilation, respiratory function stable and patient connected to nasal cannula oxygen Cardiovascular status: blood pressure returned to baseline and stable Postop Assessment: no signs of nausea or vomiting Anesthetic complications: no    Last Vitals:  Vitals:   07/23/16 1100 07/23/16 1105  BP:    Pulse: 81 85  Resp: 13 14  Temp: 36.6 C     Last Pain:  Vitals:   07/23/16 1030  TempSrc:   PainSc: Asleep                 FOSTER,MICHAEL A.

## 2016-07-23 NOTE — H&P (Signed)
TOTAL KNEE ADMISSION H&P  Patient is being admitted for left total knee arthroplasty.  Subjective:  Chief Complaint:left knee pain.  HPI: Keith Espinoza, 75 y.o. male, has a history of pain and functional disability in the left knee due to arthritis and has failed non-surgical conservative treatments for greater than 12 weeks to includeNSAID's and/or analgesics, corticosteriod injections, viscosupplementation injections, use of assistive devices and activity modification.  Onset of symptoms was gradual, starting 10 years ago with gradually worsening course since that time.  Patient currently rates pain in the left knee(s) at 10 out of 10 with activity. Patient has night pain, worsening of pain with activity and weight bearing, pain that interferes with activities of daily living, pain with passive range of motion, crepitus and joint swelling.  Patient has evidence of subchondral cysts, subchondral sclerosis, periarticular osteophytes and joint space narrowing by imaging studies. . There is no active infection.  Patient Active Problem List   Diagnosis Date Noted  . Hyperlipidemia 09/04/2014  . Thrombocytopenia ([place])   . Precordial chest pain 04/05/2014  . Coronary atherosclerosis of native coronary artery 10/01/2013  . Other and unspecified hyperlipidemia 10/01/2013  . Essential hypertension, benign 10/01/2013  . Insulin dependent type 2 diabetes mellitus ([place]) 10/01/2013  . Esophageal reflux 10/01/2013  . Hypertrophy of prostate without urinary obstruction and other lower urinary tract symptoms (LUTS) 10/01/2013   Past Medical History:  Diagnosis Date  . Allergic rhinitis   . Allergic rhinitis   . Arthritis   . Chronic leg pain    right  . Chronic lower back pain   . Coronary artery disease    a. Stenting to RCA 2004; staged DES to LAD and Cx 2004. DES to mRCA 2012. b. DES to mCx, PTCA to dCx 11/2011. c. Lateral wall MI 2013 s/p PTCA to distal Cx & DES to mid OM2 11/2011. d. Low risk nuc  04/2014, EF wnl.  . Diabetes mellitus    Insulin dependent  . Diabetic neuropathy (HCC)    MILD  . Diverticulosis   . Dysrhythmia   . Gilbert syndrome   . Gout    right wrist; right foot; right elbow; have had it since 1970's  . H/O hiatal hernia   . Heart murmur   . History of echocardiogram    aortic sclerosis per echo 12/09 EF 65%, otherwise normal  . History of hemorrhoids    BLEEDING  . Hypertension    Diagnosed 1995   . Pancreatic pseudocyst    a. s/p remote drainage 2006.  Marland Kitchen Thrombocytopenia ([place])    Seen on oldest labs in system from 2004  . Vitamin B 12 deficiency    orally replaced    Past Surgical History:  Procedure Laterality Date  . BACK SURGERY     "total of 3 times" S/P fall   . CARPAL TUNNEL RELEASE Bilateral   . CHOLECYSTECTOMY  1990's  . COLONOSCOPY    . CORONARY ANGIOPLASTY  11/11/11  . CORONARY ANGIOPLASTY WITH STENT PLACEMENT  09/30/2011   "1 then; makes a total of 4"  . CORONARY ANGIOPLASTY WITH STENT PLACEMENT  11/11/11   "1; makes a total of 5"  . INGUINAL HERNIA REPAIR  2003   right  . JOINT REPLACEMENT Right 04/03/2002   hip replacment  . KNEE ARTHROSCOPY  1990's   left  . LEFT HEART CATHETERIZATION WITH CORONARY ANGIOGRAM N/A 09/30/2011   Procedure: LEFT HEART CATHETERIZATION WITH CORONARY ANGIOGRAM;  Surgeon: Jettie Booze, MD;  Location:  [facility] CATH LAB;  Service: Cardiovascular;  Laterality: N/A;  possible PCI  . LEFT HEART CATHETERIZATION WITH CORONARY ANGIOGRAM N/A 11/15/2011   Procedure: LEFT HEART CATHETERIZATION WITH CORONARY ANGIOGRAM;  Surgeon: Jettie Booze, MD;  Location: [facility] CATH LAB;  Service: Cardiovascular;  Laterality: N/A;  . PERCUTANEOUS CORONARY STENT INTERVENTION (PCI-S)  09/30/2011   Procedure: PERCUTANEOUS CORONARY STENT INTERVENTION (PCI-S);  Surgeon: Jettie Booze, MD;  Location: [facility] CATH LAB;  Service: Cardiovascular;;  . PERCUTANEOUS CORONARY STENT INTERVENTION (PCI-S) N/A 11/11/2011   Procedure: PERCUTANEOUS  CORONARY STENT INTERVENTION (PCI-S);  Surgeon: Jettie Booze, MD;  Location: [facility] CATH LAB;  Service: Cardiovascular;  Laterality: N/A;  . SHOULDER SURGERY Right    X 2  . TONSILLECTOMY  ~ 1948    Prescriptions Prior to Admission  Medication Sig Dispense Refill Last Dose  . allopurinol (ZYLOPRIM) 300 MG tablet Take 300 mg by mouth daily.    07/22/2016 at Unknown time  . amLODipine (NORVASC) 5 MG tablet Take 5 mg by mouth daily as needed (high blood pressure).    07/23/2016 at 0330  . aspirin 81 MG EC tablet TAKE 1 TABLET (81 MG TOTAL) BY MOUTH DAILY. 30 tablet 0 Past Week at Unknown time  . Bioflavonoid Products (BIOFLEX) TABS Take 1 tablet by mouth daily.   Past Week at Unknown time  . clopidogrel (PLAVIX) 75 MG tablet TAKE ONE TABLET BY MOUTH ONE TIME DAILY 30 tablet 5 Past Week at Unknown time  . gabapentin (NEURONTIN) 100 MG capsule Take 300-400 mg by mouth 2 (two) times daily.    07/23/2016 at 0330  . glimepiride (AMARYL) 4 MG tablet Take 4 mg by mouth 2 (two) times daily.    07/22/2016 at Unknown time  . insulin detemir (LEVEMIR) 100 UNIT/ML injection Inject 30-50 Units into the skin daily as needed (blood sugar). CBG 150-200 = 30 units, 200-250 = 45 units, CBG > 250 = 50 units    07/23/2016 at 0330 7 units  . isosorbide mononitrate (IMDUR) 30 MG 24 hr tablet Take 1 tablet (30 mg total) by mouth daily. 30 tablet 11 07/23/2016 at 0330  . metFORMIN (GLUCOPHAGE) 1000 MG tablet Take 1 tablet (1,000 mg total) by mouth 2 (two) times daily.   07/22/2016 at Unknown time  . potassium chloride SA (K-DUR,KLOR-CON) 20 MEQ tablet Take 1 tablet (20 mEq total) by mouth daily. 30 tablet 6 07/22/2016 at Unknown time  . rosuvastatin (CRESTOR) 10 MG tablet Take 1 tablet (10 mg total) by mouth daily. TAKE1 TABLET as directed (Patient taking differently: Take 10 mg by mouth 2 (two) times a week. Mondays and Fridays) 30 tablet 11 Past Week at Unknown time  . telmisartan-hydrochlorothiazide (MICARDIS HCT)  80-12.5 MG per tablet 2 tablets by mouth daily. (Patient taking differently: Take 1 tablet by mouth daily. )   07/22/2016 at Unknown time  . terazosin (HYTRIN) 5 MG capsule Take 5 mg by mouth at bedtime.    07/22/2016 at Unknown time  . nitroGLYCERIN (NITROSTAT) 0.4 MG SL tablet Place 0.4 mg under the tongue every 5 (five) minutes as needed for chest pain.   More than a month at Unknown time   Allergies  Allergen Reactions  . Simvastatin Other (See Comments)    SEVERE MYALGIAS   . Zetia [Ezetimibe] Other (See Comments)    MYALGIAS  . Fish Oil Nausea Only    Social History  Substance Use Topics  . Smoking status: Former Research scientist (life sciences)  . Smokeless tobacco: Current User  Types: Chew  . Alcohol use No    Family History  Problem Relation Age of Onset  . Diabetes Mother   . Hyperlipidemia Mother   . Hypertension Mother   . Cancer Father   . Hypertension Father   . Diabetes Sister   . Hypertension Sister   . Cancer Brother   . Heart attack Neg Hx      Review of Systems  Constitutional: Negative.   HENT: Negative.   Eyes: Negative.   Respiratory: Negative.   Cardiovascular: Negative.   Gastrointestinal: Negative.   Genitourinary: Negative.   Musculoskeletal: Positive for joint pain.  Skin: Negative.   Neurological: Negative.   Psychiatric/Behavioral: Negative.     Objective:  Physical Exam  Constitutional: He is oriented to person, place, and time. He appears well-developed. No distress.  HENT:  Head: Normocephalic and atraumatic.  Eyes: EOM are normal. Pupils are equal, round, and reactive to light.  Neck: Normal range of motion.  Cardiovascular: Normal rate.   Respiratory: Effort normal. No respiratory distress.  GI: He exhibits no distension.  Musculoskeletal: He exhibits tenderness.  Neurological: He is alert and oriented to person, place, and time.  Skin: Skin is warm and dry.  Psychiatric: He has a normal mood and affect.    Vital signs in last 24  hours: Temp:  [97.6 F (36.4 C)] 97.6 F (36.4 C) (10/20 0631) Pulse Rate:  [62] 62 (10/20 0631) Resp:  [18] 18 (10/20 0631) BP: (136)/(61) 136/61 (10/20 0631) SpO2:  [97 %] 97 % (10/20 0631) Weight:  [85.1 kg (187 lb 9 oz)] 85.1 kg (187 lb 9 oz) (10/20 0631)  Labs:   Estimated body mass index is 27.7 kg/m as calculated from the following:   Height as of this encounter: 5\' 9"  (1.753 m).   Weight as of this encounter: 85.1 kg (187 lb 9 oz).   Imaging Review Plain radiographs demonstrate moderate degenerative joint disease of the left knee(s). The overall alignment ismild varus. The bone quality appears to be good for age and reported activity level.  Assessment/Plan:  End stage arthritis, left knee   The patient history, physical examination, clinical judgment of the provider and imaging studies are consistent with end stage degenerative joint disease of the left knee(s) and total knee arthroplasty is deemed medically necessary. The treatment options including medical management, injection therapy arthroscopy and arthroplasty were discussed at length. The risks and benefits of total knee arthroplasty were presented and reviewed. The risks due to aseptic loosening, infection, stiffness, patella tracking problems, thromboembolic complications and other imponderables were discussed. The patient acknowledged the explanation, agreed to proceed with the plan and consent was signed. Patient is being admitted for inpatient treatment for surgery, pain control, PT, OT, prophylactic antibiotics, VTE prophylaxis, progressive ambulation and ADL's and discharge planning. The patient is planning to be discharged home with home health services

## 2016-07-24 LAB — CBC
HCT: 29.9 % — ABNORMAL LOW (ref 39.0–52.0)
Hemoglobin: 10.6 g/dL — ABNORMAL LOW (ref 13.0–17.0)
MCH: 31.5 pg (ref 26.0–34.0)
MCHC: 35.5 g/dL (ref 30.0–36.0)
MCV: 88.7 fL (ref 78.0–100.0)
Platelets: 120 10*3/uL — ABNORMAL LOW (ref 150–400)
RBC: 3.37 MIL/uL — ABNORMAL LOW (ref 4.22–5.81)
RDW: 14.3 % (ref 11.5–15.5)
WBC: 6.6 10*3/uL (ref 4.0–10.5)

## 2016-07-24 LAB — BASIC METABOLIC PANEL
Anion gap: 8 (ref 5–15)
BUN: 10 mg/dL (ref 6–20)
CO2: 27 mmol/L (ref 22–32)
Calcium: 8.5 mg/dL — ABNORMAL LOW (ref 8.9–10.3)
Chloride: 101 mmol/L (ref 101–111)
Creatinine, Ser: 1.06 mg/dL (ref 0.61–1.24)
GFR calc Af Amer: >60 mL/min (ref >60)
GFR calc non Af Amer: >60 mL/min (ref >60)
Glucose, Bld: 190 mg/dL — ABNORMAL HIGH (ref 65–99)
Potassium: 3.8 mmol/L (ref 3.5–5.1)
Sodium: 136 mmol/L (ref 135–145)

## 2016-07-24 LAB — GLUCOSE, CAPILLARY
Glucose-Capillary: 231 mg/dL — ABNORMAL HIGH (ref 65–99)
Glucose-Capillary: 198 mg/dL — ABNORMAL HIGH (ref 65–99)
Glucose-Capillary: 169 mg/dL — ABNORMAL HIGH (ref 65–99)
Glucose-Capillary: 95 mg/dL (ref 65–99)

## 2016-07-24 MED ORDER — INSULIN ASPART 100 UNIT/ML ~~LOC~~ SOLN: 0.0000 [IU] | Freq: Three times a day (TID) | SUBCUTANEOUS | Status: DC

## 2016-07-24 MED ORDER — INSULIN ASPART 100 UNIT/ML ~~LOC~~ SOLN
0.0000 [IU] | Freq: Three times a day (TID) | SUBCUTANEOUS | Status: DC
  Administered 2016-07-24 (×2): 3 [IU] via SUBCUTANEOUS
  Administered 2016-07-25 (×2): 5 [IU] via SUBCUTANEOUS
  Administered 2016-07-26: 2 [IU] via SUBCUTANEOUS
  Administered 2016-07-26: 5 [IU] via SUBCUTANEOUS
  Administered 2016-07-26: 2 [IU] via SUBCUTANEOUS
  Administered 2016-07-27: 3 [IU] via SUBCUTANEOUS

## 2016-07-24 MED ORDER — OXYCODONE-ACETAMINOPHEN 5-325 MG PO TABS
2.0000 | ORAL_TABLET | ORAL | Status: DC | PRN
  Administered 2016-07-24 (×2): 2 via ORAL
  Filled 2016-07-24 (×3): qty 2

## 2016-07-24 NOTE — Progress Notes (Signed)
Patient ID: Keith Espinoza, male   DOB: [birth year], 75 y.o.   MRN: [id number] Percocet made Q4 hrs instead of Oxy IR q 6 hrs.

## 2016-07-24 NOTE — Progress Notes (Signed)
Orthopedic Tech Progress Note Patient Details:  Keith Espinoza May 25, 1941 [id number]  CPM Left Knee CPM Left Knee: On Left Knee Flexion (Degrees): 60 Left Knee Extension (Degrees): 0 Additional Comments: trapeze bar patien5tm helper   Keith Espinoza 07/24/2016, 2:15 PM

## 2016-07-24 NOTE — Progress Notes (Signed)
   Subjective:  Patient reports pain as severe.  No events.  Objective:   VITALS:   Vitals:   07/23/16 1600 07/23/16 2007 07/24/16 0114 07/24/16 0356  BP: 138/68 136/73 (!) 146/74 (!) 112/54  Pulse: 68 92 98 97  Resp: 18 18 18 18   Temp:  98.1 F (36.7 C) 99 F (37.2 C) 98.4 F (36.9 C)  TempSrc:  Oral Oral Oral  SpO2: 95% 95% 98% 96%  Weight:      Height:        Neurologically intact Neurovascular intact Sensation intact distally Intact pulses distally Dorsiflexion/Plantar flexion intact Incision: dressing C/D/I and no drainage No cellulitis present Compartment soft   Lab Results  Component Value Date   WBC 6.6 07/24/2016   HGB 10.6 (L) 07/24/2016   HCT 29.9 (L) 07/24/2016   MCV 88.7 07/24/2016   PLT 120 (L) 07/24/2016     Assessment/Plan:  1 Day Post-Op   - Expected postop acute blood loss anemia - will monitor for symptoms - Up with PT/OT - progressing well - DVT ppx - SCDs, ambulation, aspirin - WBAT operative extremity - Pain control - Discharge planning  Easton Sivertson Ephriam Jenkins 07/24/2016, 7:16 AM [phone]

## 2016-07-24 NOTE — Care Management Note (Signed)
Case Management Note  Patient Details  Name: Keith Espinoza MRN: [id number] Date of Birth: March 09, 1941  Subjective/Objective:  75 yo M s/p L TKA                     Action/Plan: PT is recommending HHPT and a RW   Expected Discharge Date:                  Expected Discharge Plan:  [place]  In-House Referral:     Discharge planning Services  CM Consult  Post Acute Care Choice:    Choice offered to:  Adult Children  DME Arranged:  [facility] Agency:  [place] Arranged:  PT [place] Agency:  [place]  Status of Service:  Completed, signed off  If discussed at [place] of Stay Meetings, dates discussed:    Additional Comments: met with pt and granddaughter Garment/textile technologist). D/C plan is for pt to return home with the support of his daughter who took time off from work to take care of him. Pt lives with his wife but she is not able to assist him. Discussed HHPT and DME. They prefer to use [facility]. Tiffany stated that his grandmother used [facility] in the past. Contacted Jamaine and Reggie at [facility] for referrals.  Norina Buzzard, RN 07/24/2016, 11:15 AM

## 2016-07-24 NOTE — Progress Notes (Signed)
OT Cancellation Note  Patient Details Name: ROMAIN KAISER MRN: [id number] DOB: [birth year]   Cancelled Treatment:    Reason Eval/Treat Not Completed: Fatigue/lethargy limiting ability to participate (pt returned to bed and fatigued following PT. Will follow)  Malka So 07/24/2016, 4:19 PM

## 2016-07-24 NOTE — Progress Notes (Signed)
Physical Therapy Treatment Patient Details Name: Keith Espinoza MRN: [id number] DOB: May 12, 1941 Today's Date: 07/24/2016    History of Present Illness Pt is a 75 y.o. male now s/p Lt TKA. PMH: HTN, gout, neuropathy, diabetes, CAD, Chronic low back pain, Rt THA, back surgery.     PT Comments    Pt performed treatment with limited activity and required significant assistance from previous session.  Will f/u in pm to assess mobility and will inform supervising PT of patient's decline in function.    Follow Up Recommendations  Home health PT;Supervision for mobility/OOB     Equipment Recommendations  Rolling walker with 5" wheels    Recommendations for Other Services       Precautions / Restrictions Precautions Precautions: Fall;Knee Precaution Booklet Issued: Yes (comment) Precaution Comments: HEP provided, reviewed knee extension precautions Restrictions Weight Bearing Restrictions: Yes LLE Weight Bearing: Weight bearing as tolerated    Mobility  Bed Mobility Overal bed mobility: Needs Assistance Bed Mobility: Supine to Sit     Supine to sit: HOB elevated;Max assist     General bed mobility comments: Pt required increased assist including advancement of LEs to edge of bed and assist with trunk into seated position.    Transfers Overall transfer level: Needs assistance Equipment used: Rolling walker (2 wheeled) Transfers: Sit to/from Stand Sit to Stand: Mod assist;+2 physical assistance         General transfer comment: Cues for hand placement, assist for forward weight shifting and boosting into standing.   Ambulation/Gait Ambulation/Gait assistance: Min guard Ambulation Distance (Feet): 2 Feet (Pt able to take two steps with mod +2 assist.  ) Assistive device: Rolling walker (2 wheeled) Gait Pattern/deviations: Step-to pattern;Trunk flexed;Antalgic;Decreased stride length Gait velocity: decreased   General Gait Details: Cues for gait sequencing, upper trunk  control, RW position, and advancing steps forward.     Stairs            Wheelchair Mobility    Modified Rankin (Stroke Patients Only)       Balance Overall balance assessment: Needs assistance   Sitting balance-Leahy Scale: Good       Standing balance-Leahy Scale: Fair                      Cognition Arousal/Alertness: Awake/alert Behavior During Therapy: Impulsive Overall Cognitive Status: Within Functional Limits for tasks assessed                      Exercises      General Comments        Pertinent Vitals/Pain Pain Assessment: 0-10 Pain Score: 8  Pain Location: L knee Pain Descriptors / Indicators: Aching;Burning;Grimacing;Guarding Pain Intervention(s): Monitored during session;Repositioned;Ice applied    Home Living                      Prior Function            PT Goals (current goals can now be found in the care plan section) Acute Rehab PT Goals Patient Stated Goal: get home Potential to Achieve Goals: Good Progress towards PT goals: Not progressing toward goals - comment    Frequency    7X/week      PT Plan Current plan remains appropriate    Co-evaluation             End of Session Equipment Utilized During Treatment: Gait belt Activity Tolerance: Patient limited by fatigue;Treatment limited secondary to agitation;Patient limited  by pain;Patient limited by lethargy       Time: [id number] PT Time Calculation (min) (ACUTE ONLY): 35 min  Charges:  $Therapeutic Activity: 23-37 mins                    G Codes:      Cristela Blue 15-Aug-2016, 12:49 PM  Governor Rooks, PTA pager [phone]

## 2016-07-24 NOTE — Progress Notes (Signed)
   Subjective: 1 Day Post-Op Procedure(s) (LRB): LEFT TOTAL KNEE ARTHROPLASTY (Left) Patient reports pain as mild and moderate.  States pain was severe. Had dilaudid and falls asleep while I talk to him.   Objective: Vital signs in last 24 hours: Temp:  [97.1 F (36.2 C)-99 F (37.2 C)] 98.4 F (36.9 C) (10/21 0356) Pulse Rate:  [68-98] 97 (10/21 0356) Resp:  [12-21] 18 (10/21 0356) BP: (112-188)/(54-95) 112/54 (10/21 0356) SpO2:  [92 %-100 %] 96 % (10/21 0356)  Intake/Output from previous day: 10/20 0701 - 10/21 0700 In: 3456.1 [P.O.:450; I.V.:2956.1; IV Piggyback:50] Out: 2450 [Urine:2375; Blood:75] Intake/Output this shift: No intake/output data recorded.   Recent Labs  07/24/16 0333  HGB 10.6*    Recent Labs  07/24/16 0333  WBC 6.6  RBC 3.37*  HCT 29.9*  PLT 120*    Recent Labs  07/24/16 0333  NA 136  K 3.8  CL 101  CO2 27  BUN 10  CREATININE 1.06  GLUCOSE 190*  CALCIUM 8.5*   No results for input(s): LABPT, INR in the last 72 hours.  Neurologically intact  Assessment/Plan: 1 Day Post-Op Procedure(s) (LRB): LEFT TOTAL KNEE ARTHROPLASTY (Left) Up with therapy  Marybelle Killings 07/24/2016, 9:07 AM

## 2016-07-24 NOTE — Progress Notes (Signed)
Physical Therapy Treatment Patient Details Name: Keith Espinoza MRN: [id number] DOB: 1941-01-24 Today's Date: 07/24/2016    History of Present Illness Pt is a 75 y.o. male now s/p Lt TKA. PMH: HTN, gout, neuropathy, diabetes, CAD, Chronic low back pain, Rt THA, back surgery.     PT Comments    Pt with increased confusion and decreased mobility during second PT session compared to initial evaluation. Family present throughout session and in agreement that the pt is not acting like himself. At this time the pt will need to increase his activity tolerance in order to be able to safely D/C to home. PT to continue to follow and assess the patient's progress tomorrow with modifications to recommendations being made as needed.   Follow Up Recommendations  Home health PT;Supervision for mobility/OOB     Equipment Recommendations  Rolling walker with 5" wheels    Recommendations for Other Services       Precautions / Restrictions Precautions Precautions: Fall;Knee Precaution Booklet Issued: Yes (comment) Precaution Comments: HEP provided, reviewed knee extension precautions Required Braces or Orthoses: Knee Immobilizer - Left Knee Immobilizer - Left: On except when in CPM Restrictions Weight Bearing Restrictions: Yes LLE Weight Bearing: Weight bearing as tolerated    Mobility  Bed Mobility Overal bed mobility: Needs Assistance Bed Mobility: Sit to Supine     Supine to sit: HOB elevated;Max assist Sit to supine: +2 for physical assistance;Mod assist;Min assist   General bed mobility comments: assist provided at trunk and LEs  Transfers Overall transfer level: Needs assistance Equipment used: Rolling walker (2 wheeled) Transfers: Sit to/from Stand Sit to Stand: Mod assist         General transfer comment: cues for hand placement and encouraging weightbearing through LLE in standing.   Ambulation/Gait Ambulation/Gait assistance: Min assist Ambulation Distance (Feet): 12  Feet Assistive device: Rolling walker (2 wheeled) Gait Pattern/deviations: Step-to pattern;Decreased step length - left;Decreased weight shift to left Gait velocity: decreased   General Gait Details: Pt attempting to minimize weightbearing through LLE during standing and ambulation. Physical assist provided to advance LLE with swing phase. Close follow with chair for safety. Pt reaching for objects in room to hold.    Stairs            Wheelchair Mobility    Modified Rankin (Stroke Patients Only)       Balance Overall balance assessment: Needs assistance Sitting-balance support: No upper extremity supported Sitting balance-Leahy Scale: Fair     Standing balance support: Bilateral upper extremity supported Standing balance-Leahy Scale: Poor Standing balance comment: using rw                    Cognition Arousal/Alertness: Awake/alert Behavior During Therapy: Impulsive Overall Cognitive Status: Impaired/Different from baseline Area of Impairment: Safety/judgement         Safety/Judgement: Decreased awareness of safety     General Comments: Pt more confused today, repeated cues needed throughout session.     Exercises      General Comments        Pertinent Vitals/Pain Pain Assessment: Faces Pain Score: 8  Faces Pain Scale: Hurts whole lot Pain Location: Lt knee Pain Descriptors / Indicators: Guarding;Grimacing;Moaning Pain Intervention(s): Limited activity within patient's tolerance;Monitored during session    Home Living                      Prior Function            PT Goals (  current goals can now be found in the care plan section) Acute Rehab PT Goals Patient Stated Goal: have less pain PT Goal Formulation: With patient Time For Goal Achievement: 08/06/16 Potential to Achieve Goals: Good Progress towards PT goals: Progressing toward goals    Frequency    7X/week      PT Plan Current plan remains appropriate     Co-evaluation             End of Session Equipment Utilized During Treatment: Gait belt;Left knee immobilizer Activity Tolerance: Patient limited by fatigue;Patient limited by pain Patient left: in bed;in CPM;with call bell/phone within reach;with family/visitor present;with SCD's reapplied     Time: [id number] PT Time Calculation (min) (ACUTE ONLY): 27 min  Charges:  $Gait Training: 23-37 mins                     G Codes:      Cassell Clement, PT, CSCS Pager [phone] Office [phone]  07/24/2016, 4:13 PM

## 2016-07-25 ENCOUNTER — Encounter (HOSPITAL_COMMUNITY): Payer: Self-pay | Admitting: *Deleted

## 2016-07-25 LAB — CBC
HCT: 24.1 % — ABNORMAL LOW (ref 39.0–52.0)
Hemoglobin: 8.5 g/dL — ABNORMAL LOW (ref 13.0–17.0)
MCH: 31.5 pg (ref 26.0–34.0)
MCHC: 35.3 g/dL (ref 30.0–36.0)
MCV: 89.3 fL (ref 78.0–100.0)
Platelets: 99 10*3/uL — ABNORMAL LOW (ref 150–400)
RBC: 2.7 MIL/uL — ABNORMAL LOW (ref 4.22–5.81)
RDW: 14.3 % (ref 11.5–15.5)
WBC: 6.6 10*3/uL (ref 4.0–10.5)

## 2016-07-25 LAB — BASIC METABOLIC PANEL
Anion gap: 7 (ref 5–15)
BUN: 15 mg/dL (ref 6–20)
CO2: 29 mmol/L (ref 22–32)
Calcium: 8.4 mg/dL — ABNORMAL LOW (ref 8.9–10.3)
Chloride: 101 mmol/L (ref 101–111)
Creatinine, Ser: 1.14 mg/dL (ref 0.61–1.24)
GFR calc Af Amer: >60 mL/min (ref >60)
GFR calc non Af Amer: >60 mL/min (ref >60)
Glucose, Bld: 86 mg/dL (ref 65–99)
Potassium: 3.7 mmol/L (ref 3.5–5.1)
Sodium: 137 mmol/L (ref 135–145)

## 2016-07-25 LAB — GLUCOSE, CAPILLARY
Glucose-Capillary: 93 mg/dL (ref 65–99)
Glucose-Capillary: 202 mg/dL — ABNORMAL HIGH (ref 65–99)
Glucose-Capillary: 221 mg/dL — ABNORMAL HIGH (ref 65–99)
Glucose-Capillary: 98 mg/dL (ref 65–99)

## 2016-07-25 MED ORDER — HYDROCODONE-ACETAMINOPHEN 7.5-325 MG PO TABS
1.0000 | ORAL_TABLET | Freq: Four times a day (QID) | ORAL | Status: DC | PRN
  Administered 2016-07-25 (×2): 2 via ORAL
  Administered 2016-07-26: 1 via ORAL
  Filled 2016-07-25: qty 1
  Filled 2016-07-25 (×2): qty 2

## 2016-07-25 NOTE — Progress Notes (Signed)
   Subjective:  Patient is lethargic today. Denies any symptoms other than pain in knee.  Objective:   VITALS:   Vitals:   07/24/16 0356 07/24/16 2041 07/25/16 0438 07/25/16 0838  BP: (!) 112/54 138/61 (!) 107/54 125/61  Pulse: 97 (!) 105 (!) 103 98  Resp: 18     Temp: 98.4 F (36.9 C) 98.1 F (36.7 C) 98.7 F (37.1 C)   TempSrc: Oral Oral Axillary   SpO2: 96% 95% 92%   Weight:      Height:        Neurologically intact Neurovascular intact Sensation intact distally Intact pulses distally Dorsiflexion/Plantar flexion intact Incision: dressing C/D/I and no drainage No cellulitis present Compartment soft   Lab Results  Component Value Date   WBC 6.6 07/25/2016   HGB 8.5 (L) 07/25/2016   HCT 24.1 (L) 07/25/2016   MCV 89.3 07/25/2016   PLT 99 (L) 07/25/2016     Assessment/Plan:  2 Days Post-Op   - oxy changed to norco for lethargy - patient stable otherwise - continue with PT  [place]ip[place] Ephriam Jenkins 07/25/2016, 8:42 AM [phone]

## 2016-07-25 NOTE — Progress Notes (Addendum)
Physical Therapy Treatment Patient Details Name: Keith Espinoza MRN: [id number] DOB: November 01, 1940 Today's Date: 07/25/2016    History of Present Illness Pt is a 75 y.o. male now s/p Lt TKA. PMH: HTN, gout, neuropathy, diabetes, CAD, Chronic low back pain, Rt THA, back surgery.     PT Comments    Pt presents with improved ability to mobilize.  Pt remains to require min to mod assist to maintain balance and complete transfers.  Will f/u this pm to address stair training.    Follow Up Recommendations  Home health PT;Supervision for mobility/OOB     Equipment Recommendations  Rolling walker with 5" wheels    Recommendations for Other Services       Precautions / Restrictions Precautions Precautions: Fall;Knee Precaution Comments: Reviewed knee extension precautions Restrictions Weight Bearing Restrictions: Yes LLE Weight Bearing: Weight bearing as tolerated    Mobility  Bed Mobility Overal bed mobility: Needs Assistance Bed Mobility: Supine to Sit     Supine to sit: HOB elevated;Mod assist     General bed mobility comments: Cues for hand placement and assist with upper trunk control to maintain sitting balance.    Transfers Overall transfer level: Needs assistance Equipment used: Rolling walker (2 wheeled) Transfers: Sit to/from Stand Sit to Stand: Mod assist         General transfer comment: Cues for hand placement, forward weight shifting.  Assist with boosting into standing position.    Ambulation/Gait Ambulation/Gait assistance: Min assist Ambulation Distance (Feet): 30 Feet Assistive device: Rolling walker (2 wheeled) Gait Pattern/deviations: Step-to pattern;Trunk flexed;Shuffle;Decreased stride length;Decreased stance time - left;Decreased step length - right Gait velocity: decreased Gait velocity interpretation: Below normal speed for age/gender General Gait Details: Cues for weight shifting, cues to keep hands on RW.  pt remains to reach for rails in  halls, bed rails and counters.  pt required cues for keeping hands on B handgrips of RW.  Will continue to follow to address safety issues.     Stairs            Wheelchair Mobility    Modified Rankin (Stroke Patients Only)       Balance Overall balance assessment: Needs assistance   Sitting balance-Leahy Scale: Poor Sitting balance - Comments: posterior lean observed.       Standing balance-Leahy Scale: Poor Standing balance comment: with RW                    Cognition Arousal/Alertness: Awake/alert Behavior During Therapy: WFL for tasks assessed/performed Overall Cognitive Status: Within Functional Limits for tasks assessed                 General Comments: remains a little groggy but much improved from previous session.      Exercises      General Comments        Pertinent Vitals/Pain Pain Assessment: 0-10 Pain Score: 8  Pain Location: L knee Pain Descriptors / Indicators: Grimacing;Guarding Pain Intervention(s): Monitored during session;Repositioned;Ice applied    Home Living                      Prior Function            PT Goals (current goals can now be found in the care plan section) Acute Rehab PT Goals Patient Stated Goal: have less pain Potential to Achieve Goals: Good Progress towards PT goals: Progressing toward goals    Frequency    7X/week  PT Plan Current plan remains appropriate    Co-evaluation             End of Session Equipment Utilized During Treatment: Gait belt Activity Tolerance: Patient limited by fatigue;Patient limited by pain Patient left: in chair;with family/visitor present     Time: [id number] PT Time Calculation (min) (ACUTE ONLY): 29 min  Charges:  $Gait Training: 8-22 mins $Therapeutic Activity: 8-22 mins                    G Codes:      Cristela Blue 08/21/16, 10:40 AM  Governor Rooks, PTA pager [phone]

## 2016-07-25 NOTE — Progress Notes (Signed)
Physical Therapy Treatment Patient Details Name: Keith Espinoza MRN: [id number] DOB: Mar 07, 1941 Today's Date: 07/25/2016    History of Present Illness Pt is a 75 y.o. male now s/p Lt TKA. PMH: HTN, gout, neuropathy, diabetes, CAD, Chronic low back pain, Rt THA, back surgery.     PT Comments    Pt performed increased activity and required decreased assist with gait training.  Pt remains to struggle with bed mobility and still requires mod assist for supine to sit.  Plan for stair training next session.  Daughter reports she can be with patient for 1 week at d/c.    Follow Up Recommendations  Home health PT;Supervision for mobility/OOB     Equipment Recommendations  Rolling walker with 5" wheels    Recommendations for Other Services       Precautions / Restrictions Precautions Precautions: Fall;Knee Precaution Booklet Issued: No Precaution Comments: Reviewed knee extension precautions Required Braces or Orthoses: Knee Immobilizer - Left Knee Immobilizer - Left: On except when in CPM Restrictions Weight Bearing Restrictions: Yes LLE Weight Bearing: Weight bearing as tolerated    Mobility  Bed Mobility Overal bed mobility: Needs Assistance Bed Mobility: Sit to Supine;Supine to Sit     Supine to sit: Mod assist Sit to supine: Supervision   General bed mobility comments: Assist for trunk during supine to sit.  Pt required increased time.    Transfers Overall transfer level: Needs assistance Equipment used: Rolling walker (2 wheeled) Transfers: Sit to/from Stand Sit to Stand: Min assist;Min guard         General transfer comment: min assist to boost into standing and min guard during repeated trials.  Pt requires increased time to complete.    Ambulation/Gait Ambulation/Gait assistance: Min guard;Supervision Ambulation Distance (Feet): 180 Feet Assistive device: Rolling walker (2 wheeled) Gait Pattern/deviations: Step-through pattern;Trunk  flexed;Antalgic;Decreased stride length Gait velocity: decreased   General Gait Details: Pt presents with improved balance but remains to require cues for R foot clearance and keeping hands on RW. Pt remains to reach for railings and counters.     Stairs            Wheelchair Mobility    Modified Rankin (Stroke Patients Only)       Balance Overall balance assessment: Needs assistance Sitting-balance support: Feet supported;No upper extremity supported Sitting balance-Leahy Scale: Fair     Standing balance support: Bilateral upper extremity supported Standing balance-Leahy Scale: Poor Standing balance comment: RW for support                    Cognition Arousal/Alertness: Awake/alert Behavior During Therapy: WFL for tasks assessed/performed Overall Cognitive Status: Within Functional Limits for tasks assessed                      Exercises      General Comments        Pertinent Vitals/Pain Pain Assessment: 0-10 Pain Score: 6  Faces Pain Scale: Hurts even more Pain Location: L knee Pain Descriptors / Indicators: Aching;Sore;Guarding;Grimacing Pain Intervention(s): Monitored during session;Repositioned    Home Living Family/patient expects to be discharged to:: Private residence Living Arrangements: Spouse/significant other Available Help at Discharge: Family;Available 24 hours/day Type of Home: House Home Access: Level entry   Home Layout: One level Home Equipment: Bedside commode;Shower seat;Grab bars - toilet;Grab bars - tub/shower Additional Comments: wife unable to assist physically    Prior Function Level of Independence: Independent  PT Goals (current goals can now be found in the care plan section) Acute Rehab PT Goals Patient Stated Goal: have less pain Potential to Achieve Goals: Good Progress towards PT goals: Progressing toward goals    Frequency    7X/week      PT Plan Current plan remains appropriate     Co-evaluation             End of Session Equipment Utilized During Treatment: Gait belt Activity Tolerance: Patient limited by fatigue;Patient limited by pain Patient left: in chair;with family/visitor present     Time: [id number] PT Time Calculation (min) (ACUTE ONLY): 33 min  Charges:  $Gait Training: 8-22 mins $Therapeutic Activity: 8-22 mins                    G Codes:      Cristela Blue 07/29/2016, 5:11 PM  Governor Rooks, PTA pager [phone]

## 2016-07-25 NOTE — Evaluation (Signed)
Occupational Therapy Evaluation Patient Details Name: Keith Espinoza MRN: [id number] DOB: 1941-02-13 Today's Date: 07/25/2016    History of Present Illness Pt is a 75 y.o. male now s/p Lt TKA. PMH: HTN, gout, neuropathy, diabetes, CAD, Chronic low back pain, Rt THA, back surgery.    Clinical Impression   Pt reports he was independent with ADL PTA. Currently pt requires mod assist for sit to stand and min assist for functional mobility. Max assist required for LB ADL, min guard for UB ADL in sitting. Pt requires verbal cues for safe use of RW. Pt planning to d/c home with 24/7 supervision from family but his wife is unable to assist physically. Recommending HHOT for follow up to maximize independence and safety with ADL and functional mobility upon return home. Pt would benefit from continued skilled OT to address established goals.    Follow Up Recommendations  Home health OT;Supervision/Assistance - 24 hour    Equipment Recommendations  None recommended by OT    Recommendations for Other Services       Precautions / Restrictions Precautions Precautions: Fall;Knee Precaution Booklet Issued: No Precaution Comments: Reviewed knee extension precautions Required Braces or Orthoses: Knee Immobilizer - Left Knee Immobilizer - Left: On except when in CPM Restrictions Weight Bearing Restrictions: Yes LLE Weight Bearing: Weight bearing as tolerated      Mobility Bed Mobility Overal bed mobility: Needs Assistance Bed Mobility: Sit to Supine     Supine to sit: HOB elevated;Mod assist Sit to supine: Mod assist   General bed mobility comments: Assist for LLE. HOB flat without use of bed rail. Pt used overhead trapeze bar to assist with repositioning.   Transfers Overall transfer level: Needs assistance Equipment used: Rolling walker (2 wheeled) Transfers: Sit to/from Stand Sit to Stand: Mod assist         General transfer comment: Mod assist to boost up from chair. Good  hand placement and technique.    Balance Overall balance assessment: Needs assistance Sitting-balance support: Feet supported;No upper extremity supported Sitting balance-Leahy Scale: Fair Sitting balance - Comments: posterior lean observed.     Standing balance support: Bilateral upper extremity supported Standing balance-Leahy Scale: Poor Standing balance comment: RW for support                            ADL Overall ADL's : Needs assistance/impaired Eating/Feeding: Independent;Sitting   Grooming: Set up;Sitting;Min guard   Upper Body Bathing: Min guard;Sitting   Lower Body Bathing: Moderate assistance;Sit to/from stand   Upper Body Dressing : Set up;Min guard;Sitting   Lower Body Dressing: Maximal assistance;Sit to/from stand   Toilet Transfer: Moderate assistance;Ambulation;BSC;RW Toilet Transfer Details (indicate cue type and reason): Simulated by sit to stand from chair with functional mobility in room. Mod assist for sit to stand, min assist for functional mobility.         Functional mobility during ADLs: Minimal assistance;Rolling walker General ADL Comments: Pt requires cues for safety with RW, not pushing walker too far ahead, sequencing sit<>stand.     Vision     Perception     Praxis      Pertinent Vitals/Pain Pain Assessment: Faces Pain Score: 8  Faces Pain Scale: Hurts even more Pain Location: L knee Pain Descriptors / Indicators: Aching;Sore;Grimacing Pain Intervention(s): Monitored during session;Repositioned     Hand Dominance     Extremity/Trunk Assessment Upper Extremity Assessment Upper Extremity Assessment: Overall WFL for tasks assessed  Lower Extremity Assessment Lower Extremity Assessment: Defer to PT evaluation   Cervical / Trunk Assessment Cervical / Trunk Assessment: Normal   Communication Communication Communication: No difficulties   Cognition Arousal/Alertness: Awake/alert Behavior During Therapy: WFL for  tasks assessed/performed Overall Cognitive Status: Within Functional Limits for tasks assessed                 General Comments: remains a little groggy but much improved from previous session.     General Comments       Exercises       Shoulder Instructions      Home Living Family/patient expects to be discharged to:: Private residence Living Arrangements: Spouse/significant other Available Help at Discharge: Family;Available 24 hours/day Type of Home: House Home Access: Level entry     Home Layout: One level     Bathroom Shower/Tub: Occupational psychologist: Handicapped height     Home Equipment: Bedside commode;Shower seat;Grab bars - toilet;Grab bars - tub/shower   Additional Comments: wife unable to assist physically      Prior Functioning/Environment Level of Independence: Independent                 OT Problem List: Decreased strength;Decreased range of motion;Decreased activity tolerance;Impaired balance (sitting and/or standing);Decreased safety awareness;Decreased knowledge of use of DME or AE;Decreased knowledge of precautions;Pain   OT Treatment/Interventions: Self-care/ADL training;Energy conservation;DME and/or AE instruction;Therapeutic activities;Patient/family education;Balance training    OT Goals(Current goals can be found in the care plan section) Acute Rehab OT Goals Patient Stated Goal: have less pain OT Goal Formulation: With patient Time For Goal Achievement: 08/08/16 Potential to Achieve Goals: Good ADL Goals Pt Will Perform Lower Body Bathing: with supervision;sit to/from stand (with or without AE) Pt Will Perform Lower Body Dressing: with supervision;sit to/from stand (with or without AE) Pt Will Transfer to Toilet: with supervision;ambulating;bedside commode (over toilet) Pt Will Perform Toileting - Clothing Manipulation and hygiene: with supervision;sit to/from stand Pt Will Perform Tub/Shower Transfer: Shower  transfer;with supervision;ambulating;shower seat;rolling walker  OT Frequency: Min 2X/week   Barriers to D/C: Decreased caregiver support  wife unable to assist physically       Co-evaluation              End of Session Equipment Utilized During Treatment: Gait belt;Rolling walker;Left knee immobilizer CPM Left Knee CPM Left Knee: Off Nurse Communication: Other (comment) (RN tech-pt able to void)  Activity Tolerance: Patient tolerated treatment well Patient left: in bed;with call bell/phone within reach;with bed alarm set;with family/visitor present   Time: [id number] OT Time Calculation (min): 19 min Charges:  OT General Charges $OT Visit: 1 Procedure OT Evaluation $OT Eval Moderate Complexity: 1 Procedure G-Codes:     Binnie Kand M.S., OTR/L Pager: [phone]  07/25/2016, 1:50 PM

## 2016-07-26 ENCOUNTER — Inpatient Hospital Stay (HOSPITAL_COMMUNITY): Payer: Medicare Other

## 2016-07-26 ENCOUNTER — Encounter (HOSPITAL_COMMUNITY): Payer: Self-pay | Admitting: Orthopaedic Surgery

## 2016-07-26 DIAGNOSIS — M79609 Pain in unspecified limb: Secondary | ICD-10-CM

## 2016-07-26 LAB — CBC
HCT: 24.2 % — ABNORMAL LOW (ref 39.0–52.0)
Hemoglobin: 8.5 g/dL — ABNORMAL LOW (ref 13.0–17.0)
MCH: 31.5 pg (ref 26.0–34.0)
MCHC: 35.1 g/dL (ref 30.0–36.0)
MCV: 89.6 fL (ref 78.0–100.0)
Platelets: 105 10*3/uL — ABNORMAL LOW (ref 150–400)
RBC: 2.7 MIL/uL — ABNORMAL LOW (ref 4.22–5.81)
RDW: 14.2 % (ref 11.5–15.5)
WBC: 6 10*3/uL (ref 4.0–10.5)

## 2016-07-26 LAB — BASIC METABOLIC PANEL
Anion gap: 8 (ref 5–15)
BUN: 24 mg/dL — ABNORMAL HIGH (ref 6–20)
CO2: 28 mmol/L (ref 22–32)
Calcium: 8.7 mg/dL — ABNORMAL LOW (ref 8.9–10.3)
Chloride: 101 mmol/L (ref 101–111)
Creatinine, Ser: 1.25 mg/dL — ABNORMAL HIGH (ref 0.61–1.24)
GFR calc Af Amer: >60 mL/min (ref >60)
GFR calc non Af Amer: 54 mL/min — ABNORMAL LOW (ref >60)
Glucose, Bld: 122 mg/dL — ABNORMAL HIGH (ref 65–99)
Potassium: 3.6 mmol/L (ref 3.5–5.1)
Sodium: 137 mmol/L (ref 135–145)

## 2016-07-26 LAB — GLUCOSE, CAPILLARY
Glucose-Capillary: 123 mg/dL — ABNORMAL HIGH (ref 65–99)
Glucose-Capillary: 125 mg/dL — ABNORMAL HIGH (ref 65–99)
Glucose-Capillary: 250 mg/dL — ABNORMAL HIGH (ref 65–99)
Glucose-Capillary: 113 mg/dL — ABNORMAL HIGH (ref 65–99)

## 2016-07-26 MED ORDER — ACETAMINOPHEN 500 MG PO TABS
1000.0000 mg | ORAL_TABLET | Freq: Four times a day (QID) | ORAL | Status: DC | PRN
  Administered 2016-07-26 – 2016-07-27 (×3): 1000 mg via ORAL
  Filled 2016-07-26 (×3): qty 2

## 2016-07-26 NOTE — Consult Note (Signed)
           [facility] CM Primary Care Navigator  07/26/2016  Keith Espinoza 10/18/1940 [id number]   Patient seen at the bedside to identify discharge needs. Patient is getting ready to get out of bed with PT. Wife [name]) and daughter [name]) are at the bedside as well. Patient's daughter shared that his complaints of increased pain with activity/ movements which inspite of non-surgical interventions failed had led to this admission. Family expects patient to discharge to home with [facility] PT and OT. Patient's wife confirms that primary care provider is Dr. [name] with [facility] at [facility]. Transportation to doctors' appointments is provided by daughter or grandchildren.  Patient's wife states using [facility] in [place] to obtain medications without difficulty. Patient manages his medications with assistance from his daughter  using the "pill box" system. Spouse is the primary caregiver at home as stated by daughter. Home Health services from [facility] had been arranged. Daughter was already contacted and rolling walker had been delivered as well.  Patient's daughter and wife had expressed understanding to call primary care provider's office when patient gets home for a post discharge follow-up appointment within a week or sooner if needed. Patient letter provided as a reminder.  Family denied any further needs or concerns at this time. According to daughter, patient's diabetes is well controlled with medications, diet and exercise at home and is managed by primary care provider.  For questions, please contact:  Dannielle Huh, BSN, RN- [facility] Primary Care Navigator  Telephone: [phone] [place]

## 2016-07-26 NOTE — Progress Notes (Signed)
PT Cancellation Note  Patient Details Name: Keith Espinoza MRN: [id number] DOB: 12-12-1940   Cancelled Treatment:    Reason Eval/Treat Not Completed: Medical issues which prohibited therapy (Spoke with RN who reports patient is confused this am.  Pt down for doppler study.  Will f/u when patient is medically ready.  )   Cristela Blue 07/26/2016, 9:26 AM Governor Rooks, PTA pager [phone]

## 2016-07-26 NOTE — Progress Notes (Signed)
VASCULAR LAB PRELIMINARY  PRELIMINARY  PRELIMINARY  PRELIMINARY  Left lower extremity venous duplex completed.    Preliminary report:  There is no DVT or SVT in the left lower extremity.   KANADY, CANDACE, RVT 07/26/2016, 9:51 AM

## 2016-07-26 NOTE — Progress Notes (Signed)
Occupational Therapy Treatment Patient Details Name: Keith Espinoza MRN: [id number] DOB: 06/06/1941 Today's Date: 07/26/2016    History of present illness Pt is a 75 y.o. male now s/p Lt TKA. PMH: HTN, gout, neuropathy, diabetes, CAD, Chronic low back pain, Rt THA, back surgery.    OT comments  This 75 yo male admitted and underwent above presents to acute OT with continued deficits below (but still making slow progress). Pt and family adamant about no SNF and say someone can be with pt and wife 24/7 through next Tuesday (08/03/2016). Pt's depth perception is affected by his left eye ptosis and blurriness when it is open--made daughter aware that someone really needs to be with him whenever he is up on his feet due to this and she verbalized understanding.  Follow Up Recommendations  SNF;Supervision/Assistance - 24 hour;Other (comment) (pt and dtr adamant about no SNF in that case then would recommend HHOT)    Equipment Recommendations  None recommended by OT       Precautions / Restrictions Precautions Precautions: Fall;Knee Precaution Comments: Reviewed knee extension precautions Required Braces or Orthoses: Knee Immobilizer - Left Knee Immobilizer - Left: On except when in CPM Restrictions Weight Bearing Restrictions: Yes LLE Weight Bearing: Weight bearing as tolerated       Mobility   General bed mobility comments: pt up in recliner upon my arrival  Transfers Overall transfer level: Needs assistance Equipment used: Rolling walker (2 wheeled) Transfers: Sit to/from Stand Sit to Stand: Min assist         General transfer comment: cues needed for hand position    Balance Overall balance assessment: Needs assistance Sitting-balance support: No upper extremity supported;Feet supported Sitting balance-Leahy Scale: Fair     Standing balance support: Single extremity supported;During functional activity Standing balance-Leahy Scale: Poor (leaned his body against sink to  stablize) Standing balance comment: standing at sink to clean dentures                   ADL Overall ADL's : Needs assistance/impaired     Grooming: Oral care;Min guard;Standing                   Toilet Transfer: Min guard;Ambulation;RW Toilet Transfer Details (indicate cue type and reason): recliner>bathroom to do teeth>back to recliner                  Vision                 Additional Comments: Pt with ptosis of left eye (which family states as new and PAC said so as well--CT of head negative)   Perception Perception Perception Tested?: Yes Perception Deficits: Spatial orientation Spatial deficits: decreased awareness on left due to ptosis of left eye and blurry when it is open       Cognition  mild lethargy--suspect due to medications per daughter Behavior During Therapy: WFL for tasks assessed/performed Overall Cognitive Status: Within Functional Limits for tasks assessed                                    Pertinent Vitals/ Pain       Pain Assessment: Faces Faces Pain Scale: Hurts even more Pain Location: left knee Pain Descriptors / Indicators: Aching;Guarding;Grimacing;Tightness Pain Intervention(s): Limited activity within patient's tolerance;Monitored during session;Repositioned         Frequency  Min 2X/week  Progress Toward Goals  OT Goals(current goals can now be found in the care plan section)  Progress towards OT goals: Progressing toward goals  Acute Rehab OT Goals Patient Stated Goal: feel better.   Plan Discharge plan needs to be updated       End of Session Equipment Utilized During Treatment: Gait belt;Rolling walker;Left knee immobilizer   Activity Tolerance Patient tolerated treatment well   Patient Left in chair;with call bell/phone within reach;with family/visitor present   Nurse Communication          Time: [id number] OT Time Calculation (min): 39 min  Charges: OT General  Charges $OT Visit: 1 Procedure OT Treatments $Self Care/Home Management : 38-52 mins  Almon Register [id number] 07/26/2016, 3:58 PM

## 2016-07-26 NOTE — Progress Notes (Signed)
Subjective: Patient has had some confusion over the weekend.  Slow moving with PT. States having trouble seeing out of left eye due to ptosis.  No eye pain. Denies chest pain, sob.  Increased pain left leg.     Objective: Vital signs in last 24 hours: Temp:  [98.3 F (36.8 C)-99 F (37.2 C)] 98.3 F (36.8 C) (10/23 0900) Pulse Rate:  [90-97] 93 (10/23 0900) Resp:  [18] 18 (10/23 0900) BP: (94-139)/(52-62) 139/58 (10/23 0900) SpO2:  [95 %] 95 % (10/23 0900)  Intake/Output from previous day: 10/22 0701 - 10/23 0700 In: 480 [P.O.:480] Out: 600 [Urine:600] Intake/Output this shift: Total I/O In: 120 [P.O.:120] Out: -    Recent Labs  07/24/16 0333 07/25/16 0304 07/26/16 0531  HGB 10.6* 8.5* 8.5*    Recent Labs  07/25/16 0304 07/26/16 0531  WBC 6.6 6.0  RBC 2.70* 2.70*  HCT 24.1* 24.2*  PLT 99* 105*    Recent Labs  07/25/16 0304 07/26/16 0531  NA 137 137  K 3.7 3.6  CL 101 101  CO2 29 28  BUN 15 24*  CREATININE 1.14 1.25*  GLUCOSE 86 122*  CALCIUM 8.4* 8.7*   No results for input(s): LABPT, INR in the last 72 hours.  Exam:  Patient is alert and oriented this morning.  RN reported some issues with answering her questions this AM.  Does have left eye ptosis that is not his norm.  Moves both upper extremities well.  Left knee staples intact.  Some redness anterior knee.  No drainage or gross signs of infection.  Left calf is marked TTP.  Moves toes well.  Pedal pulses intact.    Assessment/Plan: Will get venous doppler left LE r/o DVT and also CT head due to altered mental status and new left eye ptosis.  Advised patient that we think he will need short snf placement before d/c home.  Voices understanding.  D/c IV.  hgb 8.5 will recheck in AM.  If continues to drop will transfuse.    Kariem Brisbane 07/26/2016, 9:17 AM

## 2016-07-26 NOTE — Progress Notes (Signed)
Patient is confused at this time no pain but seems to have problems remembering  where he  Is. Reoriented to room and time but he seems to know his MD and family easy to reorient. Will continue to monitor

## 2016-07-26 NOTE — Progress Notes (Signed)
OT Cancellation Note  Patient Details Name: Keith Espinoza MRN: [id number] DOB: 08-Sep-1941   Cancelled Treatment:    Reason Eval/Treat Not Completed: Medical issues which prohibited therapy. PAC into room while I was there and asked therapy to wait until after dopplers for LLE completed and now pt down for CT. Will check back as schedule allows.  Almon Register [id number] 07/26/2016, 10:34 AM

## 2016-07-26 NOTE — Progress Notes (Signed)
Physical Therapy Treatment Patient Details Name: Keith Espinoza MRN: [id number] DOB: 12-03-1940 Today's Date: 07/26/2016    History of Present Illness Pt is a 75 y.o. male now s/p Lt TKA. PMH: HTN, gout, neuropathy, diabetes, CAD, Chronic low back pain, Rt THA, back surgery.     PT Comments    Nursing confirming that CT negative and doppler negative. Pt lethargic initially during session but becoming more alert as session progressed. Pt able to ambulate 100 ft with rw and min guard assistance. Bed mobility remains difficult, requiring mod assistance. Discussion had with pt and family who state that they do not want to have the pt go to SNF. Family state that they can arrange for additional assistance at home if needed. The pt does continue to vary from lethargic with poor mobility to awake and alert with ambulation/mobility progress. Based upon the patient's variability with activity, recommending SNF at D/C. PT to continue to follow with modifications to recommendations being made as needed.   Follow Up Recommendations  SNF;Supervision for mobility/OOB     Equipment Recommendations  Rolling walker with 5" wheels    Recommendations for Other Services       Precautions / Restrictions Precautions Precautions: Fall;Knee Precaution Comments: Reviewed knee extension precautions Required Braces or Orthoses: Knee Immobilizer - Left Knee Immobilizer - Left: On except when in CPM Restrictions Weight Bearing Restrictions: Yes LLE Weight Bearing: Weight bearing as tolerated    Mobility  Bed Mobility Overal bed mobility: Needs Assistance Bed Mobility: Supine to Sit     Supine to sit: Mod assist     General bed mobility comments: assist provided with LLE and trunk,  pt using rail and HOB elevated.   Transfers Overall transfer level: Needs assistance Equipment used: Rolling walker (2 wheeled) Transfers: Sit to/from Stand Sit to Stand: Min assist         General transfer  comment: cues needed for hand position  Ambulation/Gait Ambulation/Gait assistance: Min guard Ambulation Distance (Feet): 100 Feet Assistive device: Rolling walker (2 wheeled) Gait Pattern/deviations: Step-to pattern;Decreased weight shift to left Gait velocity: decreased       Stairs            Wheelchair Mobility    Modified Rankin (Stroke Patients Only)       Balance Overall balance assessment: Needs assistance Sitting-balance support: No upper extremity supported Sitting balance-Leahy Scale: Fair     Standing balance support: No upper extremity supported;During functional activity Standing balance-Leahy Scale: Fair Standing balance comment: able to stand static without UE support                    Cognition Arousal/Alertness:  (mild lethargy) Behavior During Therapy: WFL for tasks assessed/performed Overall Cognitive Status: Within Functional Limits for tasks assessed                      Exercises Total Joint Exercises Ankle Circles/Pumps: AROM;Both;10 reps Quad Sets: Strengthening;Left;10 reps Heel Slides: AAROM;Left;10 reps Straight Leg Raises: Strengthening;Left;10 reps (mod assist) Goniometric ROM: 35 degrees knee flexion Lt    General Comments        Pertinent Vitals/Pain Pain Assessment: Faces Faces Pain Scale: Hurts even more Pain Location: Lt knee Pain Descriptors / Indicators: Aching Pain Intervention(s): Limited activity within patient's tolerance;Monitored during session    Home Living                      Prior Function  PT Goals (current goals can now be found in the care plan section) Acute Rehab PT Goals Patient Stated Goal: feel better.  PT Goal Formulation: With patient Time For Goal Achievement: 08/06/16 Potential to Achieve Goals: Good Progress towards PT goals: Progressing toward goals (variable progress)    Frequency    7X/week      PT Plan Discharge plan needs to be  updated    Co-evaluation             End of Session Equipment Utilized During Treatment: Gait belt;Left knee immobilizer Activity Tolerance: Patient tolerated treatment well (reports having less pain than anticipated) Patient left: in chair;with call bell/phone within reach;with chair alarm set;with family/visitor present (in lt knee extension)     Time: [id number] PT Time Calculation (min) (ACUTE ONLY): 54 min  Charges:  $Gait Training: 23-37 mins $Therapeutic Exercise: 23-37 mins                    G Codes:      Cassell Clement, PT, CSCS Pager [phone] Office [phone]  07/26/2016, 2:25 PM

## 2016-07-26 NOTE — Progress Notes (Signed)
Pt declines to take hydrocodone for pain, pt and family requesting tylenol only for pain.  Pt had some confusion after IV dilaudid this past weekend, had 1 po hydrocodone earlier today and did well, but pt and family prefer to use tylenol only. Dr Durward Fortes on call, orders received for tylenol.

## 2016-07-27 LAB — GLUCOSE, CAPILLARY
Glucose-Capillary: 108 mg/dL — ABNORMAL HIGH (ref 65–99)
Glucose-Capillary: 193 mg/dL — ABNORMAL HIGH (ref 65–99)

## 2016-07-27 MED ORDER — ASPIRIN 325 MG PO TABS: 325.0000 mg | ORAL_TABLET | Freq: Every day | ORAL | 0 refills | Status: DC

## 2016-07-27 MED ORDER — HYDROCODONE-ACETAMINOPHEN 7.5-325 MG PO TABS: 1.0000 | ORAL_TABLET | Freq: Four times a day (QID) | ORAL | 0 refills | Status: DC | PRN

## 2016-07-27 NOTE — Care Management (Signed)
Patient will discharge home with family, Home Health has been arranged, patient's family states he has RW and 3in1.

## 2016-07-27 NOTE — Care Management Important Message (Signed)
Important Message  Patient Details  Name: ISOM LETTIERI MRN: [id number] Date of Birth: 1940-11-12   Medicare Important Message Given:  Yes    Iris Bratton 07/27/2016, 1:31 PM

## 2016-07-27 NOTE — Progress Notes (Signed)
   Subjective: 4 Days Post-Op Procedure(s) (LRB): LEFT TOTAL KNEE ARTHROPLASTY (Left) Patient reports pain as mild.   Patient states " I do not want to go to nursing home " Objective: Vital signs in last 24 hours: Temp:  [98 F (36.7 C)-98.8 F (37.1 C)] 98.1 F (36.7 C) (10/24 0530) Pulse Rate:  [90-98] 90 (10/24 0530) Resp:  [18] 18 (10/24 0530) BP: (139-151)/(58-73) 140/62 (10/24 0530) SpO2:  [92 %-98 %] 98 % (10/24 0530)  Intake/Output from previous day: 10/23 0701 - 10/24 0700 In: 360 [P.O.:360] Out: 1500 [Urine:1500] Intake/Output this shift: No intake/output data recorded.   Recent Labs  07/25/16 0304 07/26/16 0531  HGB 8.5* 8.5*    Recent Labs  07/25/16 0304 07/26/16 0531  WBC 6.6 6.0  RBC 2.70* 2.70*  HCT 24.1* 24.2*  PLT 99* 105*    Recent Labs  07/25/16 0304 07/26/16 0531  NA 137 137  K 3.7 3.6  CL 101 101  CO2 29 28  BUN 15 24*  CREATININE 1.14 1.25*  GLUCOSE 86 122*  CALCIUM 8.4* 8.7*   No results for input(s): LABPT, INR in the last 72 hours.  Neurologically intact  Assessment/Plan: 4 Days Post-Op Procedure(s) (LRB): LEFT TOTAL KNEE ARTHROPLASTY (Left) Up with therapy, D/C home after PT today . Needs HHPT, HHOT.   Marybelle Killings 07/27/2016, 7:41 AM

## 2016-07-27 NOTE — Progress Notes (Signed)
Occupational Therapy Treatment Patient Details Name: Keith Espinoza MRN: [id number] DOB: 05-21-1941 Today's Date: 07/27/2016    History of present illness Pt is a 75 y.o. male now s/p Lt TKA. PMH: HTN, gout, neuropathy, diabetes, CAD, Chronic low back pain, Rt THA, back surgery.    OT comments  This 75 yo male making progress with OT, but slowly--plan is home today since pt and family do not want SNF.He will continue to benefit from [facility] to work on increasing to a Mod I/Indepedent level for basic ADLs.  Follow Up Recommendations  SNF;Supervision/Assistance - 24 hour;Other (comment) (pt and dtr adamant no SNF, so recommend HHOT)    Equipment Recommendations  None recommended by OT       Precautions / Restrictions Precautions Precautions: Fall;Knee Required Braces or Orthoses: Knee Immobilizer - Left Knee Immobilizer - Left: On except when in CPM Restrictions Weight Bearing Restrictions: No LLE Weight Bearing: Weight bearing as tolerated       Mobility Bed Mobility Overal bed mobility: Needs Assistance Bed Mobility: Sit to Supine     Supine to sit: Min assist Sit to supine: Min guard (HOB flat and no rail from the right side of bed)   General bed mobility comments: Min assist with LLE, pt using rail to assist  Transfers Overall transfer level: Needs assistance Equipment used: Rolling walker (2 wheeled) Transfers: Sit to/from Stand Sit to Stand: Min guard         General transfer comment:  difficulty with controlling descent onto bed.     Balance Overall balance assessment: Needs assistance Sitting-balance support: No upper extremity supported;Feet supported Sitting balance-Leahy Scale: Fair     Standing balance support: No upper extremity supported;During functional activity Standing balance-Leahy Scale: Fair Standing balance comment: stood at sink and washed hands without UE support                   ADL Overall ADL's : Needs assistance/impaired     Grooming: Wash/dry hands;Min Dispensing optician: Min guard;Ambulation;RW;BSC (over toilet)   Toileting- Clothing Manipulation and Hygiene: Min guard;Sit to/from stand       Functional mobility during ADLs: Min guard;Rolling walker        Vision  Pt reporting vision much better today--minimal blurring and ptosis of left eye almost back to normal based off of observation and pt report.                          Cognition   Behavior During Therapy: WFL for tasks assessed/performed Overall Cognitive Status: Within Functional Limits for tasks assessed                                    Pertinent Vitals/ Pain       Pain Assessment: 0-10 Pain Score: 2  Faces Pain Scale: Hurts even more Pain Location: left knee Pain Descriptors / Indicators: Aching;Sore ("not really painful") Pain Intervention(s): Monitored during session;Repositioned (pt reported he did not want me to check if he was due to for tylenol)         Frequency  Min 2X/week        Progress Toward Goals  OT Goals(current goals can now be found in the care plan section)  Progress towards OT goals: Progressing toward goals  Acute Rehab OT Goals Patient Stated Goal: move better, less pain.   Plan Discharge plan remains appropriate       End of Session Equipment Utilized During Treatment: Gait belt;Rolling walker;Left knee immobilizer   Activity Tolerance Patient limited by fatigue ("I need to go back to bed, I'm tired")   Patient Left in bed;with call bell/phone within reach;with bed alarm set   Nurse Communication  (NT and RN--pt had bowel movement and is back in bed with alarm on)        Time: [id number] OT Time Calculation (min): 24 min  Charges: OT General Charges $OT Visit: 1 Procedure OT Treatments $Self Care/Home Management : 23-37 mins  Almon Register [id number] 07/27/2016, 10:32 AM

## 2016-07-27 NOTE — Progress Notes (Signed)
SW spoke with patient and family. They all confirm that they are not interested in a SNF.  Tilda Burrow, MSW [phone] 07/27/2016 3:04 PM

## 2016-07-27 NOTE — Progress Notes (Signed)
Physical Therapy Treatment Patient Details Name: Keith Espinoza MRN: [id number] DOB: [birth year] Today's Date: 07/27/2016    History of Present Illness Pt is a 75 y.o. male now s/p Lt TKA. PMH: HTN, gout, neuropathy, diabetes, CAD, Chronic low back pain, Rt THA, back surgery.     PT Comments    Pt adamant that he will not go to a SNF and states that he is planning to return home with his family to support. Pt more alert today but fatigued by end of session. Pt reporting periods of minimal pain but increasing with exercises. PT to continue to follow to maximize mobility and safety in anticipation of D/C to home.   Follow Up Recommendations  Home health PT;Supervision for mobility/OOB     Equipment Recommendations  Rolling walker with 5" wheels    Recommendations for Other Services       Precautions / Restrictions Precautions Precautions: Fall;Knee Required Braces or Orthoses: Knee Immobilizer - Left Knee Immobilizer - Left: On except when in CPM Restrictions Weight Bearing Restrictions: No LLE Weight Bearing: Weight bearing as tolerated    Mobility  Bed Mobility Overal bed mobility: Needs Assistance Bed Mobility: Supine to Sit     Supine to sit: Min assist     General bed mobility comments: Min assist with LLE, pt using rail to assist  Transfers Overall transfer level: Needs assistance Equipment used: Rolling walker (2 wheeled) Transfers: Sit to/from Stand Sit to Stand: Min assist         General transfer comment: cues needed for hand position, difficulty with controlling descent into chair. Pt transferring from bed>BSC>chair  Ambulation/Gait Ambulation/Gait assistance: Min guard Ambulation Distance (Feet): 150 Feet Assistive device: Rolling walker (2 wheeled) Gait Pattern/deviations: Step-to pattern;Step-through pattern;Decreased weight shift to left Gait velocity: decreased   General Gait Details: Working on pt taking even sized strides and bearing weight  through LLE. Pt reports being fatigued, taking 3 standing breaks.    Stairs            Wheelchair Mobility    Modified Rankin (Stroke Patients Only)       Balance Overall balance assessment: Needs assistance Sitting-balance support: No upper extremity supported Sitting balance-Leahy Scale: Fair     Standing balance support: No upper extremity supported;During functional activity Standing balance-Leahy Scale: Fair Standing balance comment: able to stand static without UE support                    Cognition Arousal/Alertness: Awake/alert Behavior During Therapy: WFL for tasks assessed/performed Overall Cognitive Status: Within Functional Limits for tasks assessed                      Exercises Total Joint Exercises Ankle Circles/Pumps: AROM;Both;10 reps Quad Sets: Strengthening;Left;10 reps Heel Slides: AAROM;Left;10 reps Goniometric ROM: Lt knee flexion 41 degrees    General Comments        Pertinent Vitals/Pain Pain Assessment: Faces Faces Pain Scale: Hurts even more Pain Location: Lt knee Pain Descriptors / Indicators: Grimacing;Guarding;Moaning Pain Intervention(s): Limited activity within patient's tolerance;Monitored during session    Home Living                      Prior Function            PT Goals (current goals can now be found in the care plan section) Acute Rehab PT Goals Patient Stated Goal: move better, less pain.  PT Goal Formulation: With patient Time  For Goal Achievement: 08/06/16 Potential to Achieve Goals: Good Progress towards PT goals: Progressing toward goals    Frequency    7X/week      PT Plan Discharge plan needs to be updated    Co-evaluation             End of Session Equipment Utilized During Treatment: Gait belt;Left knee immobilizer Activity Tolerance: Patient limited by fatigue (pain reports were variable during session) Patient left: in chair;with call bell/phone within  reach;with chair alarm set     Time: [id number] PT Time Calculation (min) (ACUTE ONLY): 34 min  Charges:  $Gait Training: 8-22 mins $Therapeutic Exercise: 8-22 mins                    G Codes:      Cassell Clement, PT, CSCS Pager [phone] Office [phone]  07/27/2016, 10:27 AM

## 2016-07-27 NOTE — Progress Notes (Signed)
Physical Therapy Treatment Patient Details Name: Keith Espinoza MRN: [id number] DOB: 1941-09-02 Today's Date: 07/27/2016    History of Present Illness Pt is a 75 y.o. male now s/p Lt TKA. PMH: HTN, gout, neuropathy, diabetes, CAD, Chronic low back pain, Rt THA, back surgery.     PT Comments    Patient is making good progress with PT, able to ambulate 150 ft and go up/down 2 steps. Family present and observing throughout session. From a mobility standpoint anticipate patient will be ready for DC home with family support. Patient denies any questions or concerns.       Follow Up Recommendations  Home health PT;Supervision for mobility/OOB     Equipment Recommendations  Rolling walker with 5" wheels    Recommendations for Other Services       Precautions / Restrictions Precautions Precautions: Fall;Knee Required Braces or Orthoses: Knee Immobilizer - Left Knee Immobilizer - Left: On except when in CPM Restrictions Weight Bearing Restrictions: Yes LLE Weight Bearing: Weight bearing as tolerated    Mobility  Bed Mobility Overal bed mobility: Needs Assistance Bed Mobility: Sit to Supine     Supine to sit: Min assist Sit to supine: Min guard (HOB flat and no rail from the right side of bed)   General bed mobility comments: pt using rail to assist  Transfers Overall transfer level: Needs assistance Equipment used: Rolling walker (2 wheeled) Transfers: Sit to/from Stand Sit to Stand: Min guard         General transfer comment: from bed and chair, cues to control descent  Ambulation/Gait Ambulation/Gait assistance: Min guard Ambulation Distance (Feet): 150 Feet Assistive device: Rolling walker (2 wheeled) Gait Pattern/deviations: Step-to pattern;Step-through pattern Gait velocity: decreased   General Gait Details: initially step-to pattern but progressing to step-through.    Stairs Stairs: Yes Stairs assistance: Min guard Stair Management: One rail  Right;Step to pattern;Sideways Number of Stairs: 2 General stair comments: pt with good techinique, daughter observing. Pt and family deny questions or concerns.   Wheelchair Mobility    Modified Rankin (Stroke Patients Only)       Balance Overall balance assessment: Needs assistance Sitting-balance support: No upper extremity supported Sitting balance-Leahy Scale: Good     Standing balance support: No upper extremity supported Standing balance-Leahy Scale: Fair Standing balance comment: stood at sink and washed hands without UE support                    Cognition Arousal/Alertness: Awake/alert Behavior During Therapy: WFL for tasks assessed/performed Overall Cognitive Status: Within Functional Limits for tasks assessed                      Exercises     General Comments General comments (skin integrity, edema, etc.): reviewed HEP verbally with family      Pertinent Vitals/Pain Pain Assessment: Faces Pain Score: 2  Faces Pain Scale: Hurts a little bit Pain Location: Lt knee Pain Descriptors / Indicators: Sore Pain Intervention(s): Limited activity within patient's tolerance;Monitored during session    Home Living                      Prior Function            PT Goals (current goals can now be found in the care plan section) Acute Rehab PT Goals Patient Stated Goal: move better, less pain.  PT Goal Formulation: With patient Time For Goal Achievement: 08/06/16 Potential to Achieve Goals: Good  Progress towards PT goals: Progressing toward goals    Frequency    7X/week      PT Plan Current plan remains appropriate    Co-evaluation             End of Session Equipment Utilized During Treatment: Gait belt;Left knee immobilizer Activity Tolerance: Patient tolerated treatment well Patient left: in chair;with call bell/phone within reach;with family/visitor present     Time: [id number] PT Time Calculation (min) (ACUTE  ONLY): 36 min  Charges:  $Gait Training: 23-37 mins                     G Codes:      Cassell Clement, PT, CSCS Pager [phone] Office [phone]  07/27/2016, 2:05 PM

## 2016-07-27 NOTE — Progress Notes (Signed)
Patient discharged to home, discharge instructions given, patient stated he understood

## 2016-07-28 DIAGNOSIS — Z471 Aftercare following joint replacement surgery: Secondary | ICD-10-CM | POA: Diagnosis not present

## 2016-07-28 DIAGNOSIS — K219 Gastro-esophageal reflux disease without esophagitis: Secondary | ICD-10-CM | POA: Diagnosis not present

## 2016-07-28 DIAGNOSIS — D510 Vitamin B12 deficiency anemia due to intrinsic factor deficiency: Secondary | ICD-10-CM | POA: Diagnosis not present

## 2016-07-28 DIAGNOSIS — I2510 Atherosclerotic heart disease of native coronary artery without angina pectoris: Secondary | ICD-10-CM | POA: Diagnosis not present

## 2016-07-28 DIAGNOSIS — N401 Enlarged prostate with lower urinary tract symptoms: Secondary | ICD-10-CM | POA: Diagnosis not present

## 2016-07-28 DIAGNOSIS — E785 Hyperlipidemia, unspecified: Secondary | ICD-10-CM | POA: Diagnosis not present

## 2016-07-28 DIAGNOSIS — E1142 Type 2 diabetes mellitus with diabetic polyneuropathy: Secondary | ICD-10-CM | POA: Diagnosis not present

## 2016-07-28 DIAGNOSIS — I10 Essential (primary) hypertension: Secondary | ICD-10-CM | POA: Diagnosis not present

## 2016-07-28 DIAGNOSIS — Z5181 Encounter for therapeutic drug level monitoring: Secondary | ICD-10-CM | POA: Diagnosis not present

## 2016-07-28 DIAGNOSIS — Z7984 Long term (current) use of oral hypoglycemic drugs: Secondary | ICD-10-CM | POA: Diagnosis not present

## 2016-07-28 DIAGNOSIS — Z96652 Presence of left artificial knee joint: Secondary | ICD-10-CM | POA: Diagnosis not present

## 2016-07-28 DIAGNOSIS — M1712 Unilateral primary osteoarthritis, left knee: Secondary | ICD-10-CM | POA: Diagnosis not present

## 2016-07-29 ENCOUNTER — Telehealth (INDEPENDENT_AMBULATORY_CARE_PROVIDER_SITE_OTHER): Payer: Self-pay | Admitting: *Deleted

## 2016-07-29 DIAGNOSIS — I2510 Atherosclerotic heart disease of native coronary artery without angina pectoris: Secondary | ICD-10-CM | POA: Diagnosis not present

## 2016-07-29 DIAGNOSIS — I10 Essential (primary) hypertension: Secondary | ICD-10-CM | POA: Diagnosis not present

## 2016-07-29 DIAGNOSIS — E1142 Type 2 diabetes mellitus with diabetic polyneuropathy: Secondary | ICD-10-CM | POA: Diagnosis not present

## 2016-07-29 DIAGNOSIS — Z471 Aftercare following joint replacement surgery: Secondary | ICD-10-CM | POA: Diagnosis not present

## 2016-07-29 DIAGNOSIS — Z96652 Presence of left artificial knee joint: Secondary | ICD-10-CM | POA: Diagnosis not present

## 2016-07-29 DIAGNOSIS — M1712 Unilateral primary osteoarthritis, left knee: Secondary | ICD-10-CM | POA: Diagnosis not present

## 2016-07-29 NOTE — Discharge Summary (Signed)
Patient ID: Keith Espinoza MRN: [id number] DOB/AGE: Jul 02, 1941 75 y.o.  Admit date: 07/23/2016 Discharge date: 07/29/2016  Admission Diagnoses:  Active Problems:   Left knee DJD   Discharge Diagnoses:  Active Problems:   Left knee DJD  status post Procedure(s): LEFT TOTAL KNEE ARTHROPLASTY  Past Medical History:  Diagnosis Date  . Allergic rhinitis   . Allergic rhinitis   . Arthritis   . Chronic leg pain    right  . Chronic lower back pain   . Coronary artery disease    a. Stenting to RCA 2004; staged DES to LAD and Cx 2004. DES to mRCA 2012. b. DES to mCx, PTCA to dCx 11/2011. c. Lateral wall MI 2013 s/p PTCA to distal Cx & DES to mid OM2 11/2011. d. Low risk nuc 04/2014, EF wnl.  . Diabetes mellitus    Insulin dependent  . Diabetic neuropathy (HCC)    MILD  . Diverticulosis   . Dysrhythmia   . Gilbert syndrome   . Gout    right wrist; right foot; right elbow; have had it since 1970's  . H/O hiatal hernia   . Heart murmur   . History of echocardiogram    aortic sclerosis per echo 12/09 EF 65%, otherwise normal  . History of hemorrhoids    BLEEDING  . Hypertension    Diagnosed 1995   . Pancreatic pseudocyst    a. s/p remote drainage 2006.  Marland Kitchen Thrombocytopenia ([place])    Seen on oldest labs in system from 2004  . Vitamin B 12 deficiency    orally replaced    Surgeries: Procedure(s): LEFT TOTAL KNEE ARTHROPLASTY on 07/23/2016   Consultants:   Discharged Condition: Improved  Hospital Course: Keith Espinoza is an 75 y.o. male who was admitted 07/23/2016 for operative treatment of <principal problem not specified>. Patient failed conservative treatments (please see the history and physical for the specifics) and had severe unremitting pain that affects sleep, daily activities and work/hobbies. After pre-op clearance, the patient was taken to the operating room on 07/23/2016 and underwent  Procedure(s): LEFT TOTAL KNEE ARTHROPLASTY.    Patient was given  perioperative antibiotics:  Anti-infectives    Start     Dose/Rate Route Frequency Ordered Stop   07/23/16 1500  ceFAZolin (ANCEF) IVPB 1 g/50 mL premix     1 g 100 mL/hr over 30 Minutes Intravenous Every 8 hours 07/23/16 1200 07/24/16 0015   07/23/16 0622  ceFAZolin (ANCEF) 2-4 GM/100ML-% IVPB    Comments:  Fabian Sharp   : cabinet override      07/23/16 0622 07/23/16 0751   07/23/16 0618  ceFAZolin (ANCEF) IVPB 2g/100 mL premix     2 g 200 mL/hr over 30 Minutes Intravenous On call to O.R. 07/23/16 [id number] 07/23/16 0751       Patient was given sequential compression devices and early ambulation to prevent DVT.   Patient benefited maximally from hospital stay and there were no complications. At the time of discharge, the patient was urinating/moving their bowels without difficulty, tolerating a regular diet, pain is controlled with oral pain medications and they have been cleared by PT/OT.   Recent vital signs: No data found.    Recent laboratory studies: No results for input(s): WBC, HGB, HCT, PLT, NA, K, CL, CO2, BUN, CREATININE, GLUCOSE, INR, CALCIUM in the last 72 hours.  Invalid input(s): PT, 2   Discharge Medications:     Medication List    STOP taking these medications  aspirin 81 MG EC tablet Replaced by:  aspirin 325 MG tablet     TAKE these medications   allopurinol 300 MG tablet Commonly known as:  ZYLOPRIM Take 300 mg by mouth daily.   amLODipine 5 MG tablet Commonly known as:  NORVASC Take 5 mg by mouth daily as needed (high blood pressure).   aspirin 325 MG tablet Commonly known as:  BAYER ASPIRIN Take 1 tablet (325 mg total) by mouth daily. Replaces:  aspirin 81 MG EC tablet   BIOFLEX Tabs Take 1 tablet by mouth daily.   clopidogrel 75 MG tablet Commonly known as:  PLAVIX TAKE ONE TABLET BY MOUTH ONE TIME DAILY   gabapentin 100 MG capsule Commonly known as:  NEURONTIN Take 300-400 mg by mouth 2 (two) times daily.   glimepiride 4 MG  tablet Commonly known as:  AMARYL Take 4 mg by mouth 2 (two) times daily.   HYDROcodone-acetaminophen 7.5-325 MG tablet Commonly known as:  NORCO Take 1-2 tablets by mouth every 6 (six) hours as needed for moderate pain.   insulin detemir 100 UNIT/ML injection Commonly known as:  LEVEMIR Inject 30-50 Units into the skin daily as needed (blood sugar). CBG 150-200 = 30 units, 200-250 = 45 units, CBG > 250 = 50 units   isosorbide mononitrate 30 MG 24 hr tablet Commonly known as:  IMDUR Take 1 tablet (30 mg total) by mouth daily.   metFORMIN 1000 MG tablet Commonly known as:  GLUCOPHAGE Take 1 tablet (1,000 mg total) by mouth 2 (two) times daily.   nitroGLYCERIN 0.4 MG SL tablet Commonly known as:  NITROSTAT Place 0.4 mg under the tongue every 5 (five) minutes as needed for chest pain.   potassium chloride SA 20 MEQ tablet Commonly known as:  K-DUR,KLOR-CON Take 1 tablet (20 mEq total) by mouth daily.   rosuvastatin 10 MG tablet Commonly known as:  CRESTOR Take 1 tablet (10 mg total) by mouth daily. TAKE1 TABLET as directed What changed:  when to take this  additional instructions   telmisartan-hydrochlorothiazide 80-12.5 MG tablet Commonly known as:  MICARDIS HCT 2 tablets by mouth daily. What changed:  how much to take  how to take this  when to take this  additional instructions   terazosin 5 MG capsule Commonly known as:  HYTRIN Take 5 mg by mouth at bedtime.       Diagnostic Studies: Dg Chest 2 View  Result Date: 07/14/2016 CLINICAL DATA:  Operative examination prior to knee replacement. History of coronary artery disease and angioplasty, former smoker. EXAM: CHEST  2 VIEW COMPARISON:  PA and lateral chest x-ray of August 30, 2014 FINDINGS: The lungs are mildly hypoinflated but clear. The heart is normal in size. Coronary stent grafts are observed. The pulmonary vascularity is normal. The mediastinum is normal in width. There is no pleural effusion.  There is multilevel degenerative disc disease of the thoracic spine. IMPRESSION: There is no active cardiopulmonary disease. Electronically Signed   By: David  Martinique M.D.   On: 07/14/2016 13:38   Dg Knee 1-2 Views Left  Result Date: 07/23/2016 CLINICAL DATA:  Status post left total knee arthroplasty. EXAM: LEFT KNEE - 1-2 VIEW COMPARISON:  Radiographs of June 02, 2016. FINDINGS: The femoral and tibial components are well situated. Expected postoperative changes are seen in the anterior soft tissues. Vascular calcifications are noted. No fracture or dislocation is noted IMPRESSION: Status post left total knee arthroplasty. Electronically Signed   By: Marijo Conception, M.D.  On: 07/23/2016 11:08   Ct Head Wo Contrast  Result Date: 07/26/2016 CLINICAL DATA:  Altered mental status this more. EXAM: CT HEAD WITHOUT CONTRAST TECHNIQUE: Contiguous axial images were obtained from the base of the skull through the vertex without intravenous contrast. COMPARISON:  05/16/2008 FINDINGS: Brain: Age related cerebral atrophy, ventriculomegaly and periventricular white matter disease. Fairly symmetric white matter changes are also noted in the external capsule regions bilaterally. No extra-axial fluid collections are identified. No CT findings for hemispheric infarction an or intracranial hemorrhage. Vascular: Minimal vascular calcifications. No aneurysm or hyperdense vessels. Skull: No skull fracture or bone lesion. Sinuses/Orbits: The paranasal sinuses and mastoid air cells are clear. The globes are intact. Other: No scalp lesions or hematoma. IMPRESSION: Age related cerebral atrophy, ventriculomegaly and periventricular white matter disease. No acute intracranial findings or mass lesions. Electronically Signed   By: Marijo Sanes M.D.   On: 07/26/2016 10:49      Follow-up Information    [place] .   Why:  [facility] will provide home health physical therapy Contact  information: [address] [place] [address] [phone]           Discharge Plan:  discharge to home  Disposition:     Signed: Benjiman Core for [name] [facility] orthopedics.  07/29/2016, 3:18 PM

## 2016-07-29 NOTE — Telephone Encounter (Signed)
Pt wife called asking when pt. Can shower after surgery. Call back number is [phone]

## 2016-07-30 DIAGNOSIS — I10 Essential (primary) hypertension: Secondary | ICD-10-CM | POA: Diagnosis not present

## 2016-07-30 DIAGNOSIS — Z471 Aftercare following joint replacement surgery: Secondary | ICD-10-CM | POA: Diagnosis not present

## 2016-07-30 DIAGNOSIS — I2510 Atherosclerotic heart disease of native coronary artery without angina pectoris: Secondary | ICD-10-CM | POA: Diagnosis not present

## 2016-07-30 DIAGNOSIS — Z96652 Presence of left artificial knee joint: Secondary | ICD-10-CM | POA: Diagnosis not present

## 2016-07-30 DIAGNOSIS — M1712 Unilateral primary osteoarthritis, left knee: Secondary | ICD-10-CM | POA: Diagnosis not present

## 2016-07-30 DIAGNOSIS — E1142 Type 2 diabetes mellitus with diabetic polyneuropathy: Secondary | ICD-10-CM | POA: Diagnosis not present

## 2016-07-30 NOTE — Telephone Encounter (Signed)
I called patient and advised. 

## 2016-08-02 ENCOUNTER — Ambulatory Visit (INDEPENDENT_AMBULATORY_CARE_PROVIDER_SITE_OTHER): Payer: Medicare Other

## 2016-08-02 ENCOUNTER — Encounter (INDEPENDENT_AMBULATORY_CARE_PROVIDER_SITE_OTHER): Payer: Self-pay | Admitting: Orthopaedic Surgery

## 2016-08-02 ENCOUNTER — Ambulatory Visit (INDEPENDENT_AMBULATORY_CARE_PROVIDER_SITE_OTHER): Payer: Medicare Other | Admitting: Orthopaedic Surgery

## 2016-08-02 VITALS — BP 165/80 | HR 80 | Temp 96.4°F | Ht 69.0 in | Wt 181.0 lb

## 2016-08-02 DIAGNOSIS — M1712 Unilateral primary osteoarthritis, left knee: Secondary | ICD-10-CM

## 2016-08-02 DIAGNOSIS — M25562 Pain in left knee: Secondary | ICD-10-CM

## 2016-08-02 NOTE — Progress Notes (Signed)
Office Visit Note   Patient: Keith Espinoza           Date of Birth: June 27, 1941           MRN: [id number] Visit Date: 08/02/2016              Requested by: [name], MD [address] [place], [place] [address] PCP: Henrine Screws, MD   Assessment & Plan: Visit Diagnoses: left knee primary OA , post op TKA  Postop total knee arthroplasty. X-rays and knee exam look good. He is having problems dealing with the pain and we will have him take 1 pain tablet every 4 hours instead of every 6 hours .  He will call if the his pain does not improve.  Plan: has APPT on 11/8 for suture removal.   Follow-Up Instructions: ROV 08/11/16  Orders:  Orders Placed This Encounter  Procedures  . XR Knee 1-2 Views Left   No orders of the defined types were placed in this encounter.     Procedures: No procedures performed   Clinical Data: No additional findings.   Subjective: Chief Complaint  Patient presents with  . Left Knee - Pain    Patient comes in today with increased knee pain.  He is status post left TKA 07/23/16 and is 10 days post op.  He is unable to get any relief from the pain. He stays uncomfortable. States knee pain is 12 out of 10.  "I don't think I can stand much more of it". Per patient's daughter, temperature yesterday was 98.3.   Patient has been taking Norco one every 6 hours. When he tried to take 2 tablets he became more confused. Will have him try taking 1 tablet every 4 hours. Physical therapy is coming at the house I gave him additional exercises that he can do on the days when they're not they're working on knee extension working on knee flexion.  Review of Systems unchanged   Objective: Vital Signs: BP (!) 165/80   Pulse 80   Temp (!) 96.4 F (35.8 C)   Ht 5\' 9"  (1.753 m)   Wt 181 lb (82.1 kg)   BMI 26.73 kg/m   Physical Exam vital signs stable patient is alert and oriented.  Ortho Exam staples are present there is minimal  swelling of the left knee. Distal pulses are intact. There is trace swelling of the ankle and slight ecchymosis mid tibial region as expected postop.  Specialty Comments:  No specialty comments available.  Imaging: Xr Knee 1-2 Views Left  Result Date: 08/02/2016 Two-view x-rays of the left knee are reviewed which shows anatomic position and alignment of the left total knee arthroplasty. Impression; left total knee arthroplasty good position and alignment no postop complications    PMFS History: Patient Active Problem List   Diagnosis Date Noted  . Unilateral primary osteoarthritis, left knee 07/23/2016  . Hyperlipidemia 09/04/2014  . Thrombocytopenia ([place])   . Precordial chest pain 04/05/2014  . Coronary atherosclerosis of native coronary artery 10/01/2013  . Other and unspecified hyperlipidemia 10/01/2013  . Essential hypertension, benign 10/01/2013  . Insulin dependent type 2 diabetes mellitus ([place]) 10/01/2013  . Esophageal reflux 10/01/2013  . Hypertrophy of prostate without urinary obstruction and other lower urinary tract symptoms (LUTS) 10/01/2013   Past Medical History:  Diagnosis Date  . Allergic rhinitis   . Allergic rhinitis   . Arthritis   . Chronic leg pain    right  .  Chronic lower back pain   . Coronary artery disease    a. Stenting to RCA 2004; staged DES to LAD and Cx 2004. DES to mRCA 2012. b. DES to mCx, PTCA to dCx 11/2011. c. Lateral wall MI 2013 s/p PTCA to distal Cx & DES to mid OM2 11/2011. d. Low risk nuc 04/2014, EF wnl.  . Diabetes mellitus    Insulin dependent  . Diabetic neuropathy (HCC)    MILD  . Diverticulosis   . Dysrhythmia   . Gilbert syndrome   . Gout    right wrist; right foot; right elbow; have had it since 1970's  . H/O hiatal hernia   . Heart murmur   . History of echocardiogram    aortic sclerosis per echo 12/09 EF 65%, otherwise normal  . History of hemorrhoids    BLEEDING  . Hypertension    Diagnosed 1995   . Pancreatic  pseudocyst    a. s/p remote drainage 2006.  Marland Kitchen Thrombocytopenia ([place])    Seen on oldest labs in system from 2004  . Vitamin B 12 deficiency    orally replaced    Family History  Problem Relation Age of Onset  . Diabetes Mother   . Hyperlipidemia Mother   . Hypertension Mother   . Cancer Father   . Hypertension Father   . Diabetes Sister   . Hypertension Sister   . Cancer Brother   . Heart attack Neg Hx     Past Surgical History:  Procedure Laterality Date  . BACK SURGERY     "total of 3 times" S/P fall   . CARPAL TUNNEL RELEASE Bilateral   . CHOLECYSTECTOMY  1990's  . COLONOSCOPY    . CORONARY ANGIOPLASTY  11/11/11  . CORONARY ANGIOPLASTY WITH STENT PLACEMENT  09/30/2011   "1 then; makes a total of 4"  . CORONARY ANGIOPLASTY WITH STENT PLACEMENT  11/11/11   "1; makes a total of 5"  . INGUINAL HERNIA REPAIR  2003   right  . JOINT REPLACEMENT Right 04/03/2002   hip replacment  . KNEE ARTHROSCOPY  1990's   left  . LEFT HEART CATHETERIZATION WITH CORONARY ANGIOGRAM N/A 09/30/2011   Procedure: LEFT HEART CATHETERIZATION WITH CORONARY ANGIOGRAM;  Surgeon: Jettie Booze, MD;  Location[facility] C[facility] LAB;  Service: Cardiovascular;  Laterality: N/A;  possible PCI  . LEFT HEART CATHETERIZATION WITH CORONARY ANGIOGRAM N/A 11/15/2011   Procedure: LEFT HEART CATHETERIZATION WITH CORONARY ANGIOGRAM;  Surgeon: Jettie Booze, MD;  Location: [facility] CATH LAB;  Service: Cardiovascular;  Laterality: N/A;  . PERCUTANEOUS CORONARY STENT INTERVENTION (PCI-S)  09/30/2011   Procedure: PERCUTANEOUS CORONARY STENT INTERVENTION (PCI-S);  Surgeon: Jettie Booze, MD;  Location: [facility] CATH LAB;  Service: Cardiovascular;;  . PERCUTANEOUS CORONARY STENT INTERVENTION (PCI-S) N/A 11/11/2011   Procedure: PERCUTANEOUS CORONARY STENT INTERVENTION (PCI-S);  Surgeon: Jettie Booze, MD;  Location: [facility] CATH LAB;  Service: Cardiovascular;  Laterality: N/A;  . SHOULDER SURGERY Right    X 2  . TONSILLECTOMY  ~  1948  . TOTAL KNEE ARTHROPLASTY Left 07/23/2016   Procedure: LEFT TOTAL KNEE ARTHROPLASTY;  Surgeon: Marybelle Killings, MD;  Location: [place];  Service: Orthopedics;  Laterality: Left;   Social History   Occupational History  . Retired    Social History Main Topics  . Smoking status: Former Research scientist (life sciences)  . Smokeless tobacco: Current User    Types: Chew  . Alcohol use No  . Drug use: No  . Sexual activity: Not  on file

## 2016-08-03 DIAGNOSIS — M1712 Unilateral primary osteoarthritis, left knee: Secondary | ICD-10-CM | POA: Diagnosis not present

## 2016-08-03 DIAGNOSIS — Z471 Aftercare following joint replacement surgery: Secondary | ICD-10-CM | POA: Diagnosis not present

## 2016-08-03 DIAGNOSIS — E1142 Type 2 diabetes mellitus with diabetic polyneuropathy: Secondary | ICD-10-CM | POA: Diagnosis not present

## 2016-08-03 DIAGNOSIS — I10 Essential (primary) hypertension: Secondary | ICD-10-CM | POA: Diagnosis not present

## 2016-08-03 DIAGNOSIS — Z96652 Presence of left artificial knee joint: Secondary | ICD-10-CM | POA: Diagnosis not present

## 2016-08-03 DIAGNOSIS — I2510 Atherosclerotic heart disease of native coronary artery without angina pectoris: Secondary | ICD-10-CM | POA: Diagnosis not present

## 2016-08-04 DIAGNOSIS — E1142 Type 2 diabetes mellitus with diabetic polyneuropathy: Secondary | ICD-10-CM | POA: Diagnosis not present

## 2016-08-04 DIAGNOSIS — I2510 Atherosclerotic heart disease of native coronary artery without angina pectoris: Secondary | ICD-10-CM | POA: Diagnosis not present

## 2016-08-04 DIAGNOSIS — I10 Essential (primary) hypertension: Secondary | ICD-10-CM | POA: Diagnosis not present

## 2016-08-04 DIAGNOSIS — Z471 Aftercare following joint replacement surgery: Secondary | ICD-10-CM | POA: Diagnosis not present

## 2016-08-04 DIAGNOSIS — Z96652 Presence of left artificial knee joint: Secondary | ICD-10-CM | POA: Diagnosis not present

## 2016-08-04 DIAGNOSIS — M1712 Unilateral primary osteoarthritis, left knee: Secondary | ICD-10-CM | POA: Diagnosis not present

## 2016-08-05 DIAGNOSIS — Z96652 Presence of left artificial knee joint: Secondary | ICD-10-CM | POA: Diagnosis not present

## 2016-08-05 DIAGNOSIS — I10 Essential (primary) hypertension: Secondary | ICD-10-CM | POA: Diagnosis not present

## 2016-08-05 DIAGNOSIS — I2510 Atherosclerotic heart disease of native coronary artery without angina pectoris: Secondary | ICD-10-CM | POA: Diagnosis not present

## 2016-08-05 DIAGNOSIS — M1712 Unilateral primary osteoarthritis, left knee: Secondary | ICD-10-CM | POA: Diagnosis not present

## 2016-08-05 DIAGNOSIS — Z471 Aftercare following joint replacement surgery: Secondary | ICD-10-CM | POA: Diagnosis not present

## 2016-08-05 DIAGNOSIS — E1142 Type 2 diabetes mellitus with diabetic polyneuropathy: Secondary | ICD-10-CM | POA: Diagnosis not present

## 2016-08-09 DIAGNOSIS — I2510 Atherosclerotic heart disease of native coronary artery without angina pectoris: Secondary | ICD-10-CM | POA: Diagnosis not present

## 2016-08-09 DIAGNOSIS — I10 Essential (primary) hypertension: Secondary | ICD-10-CM | POA: Diagnosis not present

## 2016-08-09 DIAGNOSIS — Z96652 Presence of left artificial knee joint: Secondary | ICD-10-CM | POA: Diagnosis not present

## 2016-08-09 DIAGNOSIS — E1142 Type 2 diabetes mellitus with diabetic polyneuropathy: Secondary | ICD-10-CM | POA: Diagnosis not present

## 2016-08-09 DIAGNOSIS — Z471 Aftercare following joint replacement surgery: Secondary | ICD-10-CM | POA: Diagnosis not present

## 2016-08-09 DIAGNOSIS — M1712 Unilateral primary osteoarthritis, left knee: Secondary | ICD-10-CM | POA: Diagnosis not present

## 2016-08-10 DIAGNOSIS — I2510 Atherosclerotic heart disease of native coronary artery without angina pectoris: Secondary | ICD-10-CM | POA: Diagnosis not present

## 2016-08-10 DIAGNOSIS — M1712 Unilateral primary osteoarthritis, left knee: Secondary | ICD-10-CM | POA: Diagnosis not present

## 2016-08-10 DIAGNOSIS — Z471 Aftercare following joint replacement surgery: Secondary | ICD-10-CM | POA: Diagnosis not present

## 2016-08-10 DIAGNOSIS — E1142 Type 2 diabetes mellitus with diabetic polyneuropathy: Secondary | ICD-10-CM | POA: Diagnosis not present

## 2016-08-10 DIAGNOSIS — Z96652 Presence of left artificial knee joint: Secondary | ICD-10-CM | POA: Diagnosis not present

## 2016-08-10 DIAGNOSIS — I10 Essential (primary) hypertension: Secondary | ICD-10-CM | POA: Diagnosis not present

## 2016-08-11 ENCOUNTER — Ambulatory Visit (INDEPENDENT_AMBULATORY_CARE_PROVIDER_SITE_OTHER): Payer: Medicare Other | Admitting: Physician Assistant

## 2016-08-11 ENCOUNTER — Telehealth (INDEPENDENT_AMBULATORY_CARE_PROVIDER_SITE_OTHER): Payer: Self-pay | Admitting: *Deleted

## 2016-08-11 ENCOUNTER — Ambulatory Visit (INDEPENDENT_AMBULATORY_CARE_PROVIDER_SITE_OTHER): Payer: Medicare Other | Admitting: Orthopaedic Surgery

## 2016-08-11 ENCOUNTER — Ambulatory Visit (INDEPENDENT_AMBULATORY_CARE_PROVIDER_SITE_OTHER): Payer: Medicare Other

## 2016-08-11 ENCOUNTER — Encounter (INDEPENDENT_AMBULATORY_CARE_PROVIDER_SITE_OTHER): Payer: Self-pay | Admitting: Orthopaedic Surgery

## 2016-08-11 VITALS — BP 80/53 | HR 102

## 2016-08-11 DIAGNOSIS — M25562 Pain in left knee: Secondary | ICD-10-CM

## 2016-08-11 DIAGNOSIS — Z96652 Presence of left artificial knee joint: Secondary | ICD-10-CM

## 2016-08-11 MED ORDER — LIDOCAINE HCL 1 % IJ SOLN
1.0000 mL | INTRAMUSCULAR | Status: AC | PRN
  Administered 2016-08-11: 1 mL

## 2016-08-11 MED ORDER — HYDROCODONE-ACETAMINOPHEN 7.5-325 MG PO TABS: 1.0000 | ORAL_TABLET | Freq: Four times a day (QID) | ORAL | 0 refills | Status: DC | PRN

## 2016-08-11 NOTE — Progress Notes (Signed)
Office Visit Note   Patient: Keith Espinoza           Date of Birth: [birth year]           MRN: [id number] Visit Date: 08/11/2016              Requested by: [name], MD [address] [place] [place], [place] [address] PCP: Henrine Screws, MD   Assessment & Plan: Visit Diagnoses:  1. Acute pain of left knee   2. Status post total left knee replacement     Plan: Staples harvested. I aspirated the patient's knee and got the 10-15 mL of serosanguineous fluid without particulate debris no evidence of infection. I was able to flex and gradually to about 80  Outpatient therapy [facility] outpatient in [place] arranged. Order given.  Follow-Up Instructions: No Follow-up on file. Office follow-up 2 weeks to check his motion.  Orders:  Orders Placed This Encounter  Procedures  . XR Knee 1-2 Views Left   Meds ordered this encounter  Medications  . HYDROcodone-acetaminophen (NORCO) 7.5-325 MG tablet    Sig: Take 1 tablet by mouth every 6 (six) hours as needed for moderate pain.    Dispense:  30 tablet    Refill:  0      Procedures: Large Joint Inj Date/Time: 08/11/2016 10:14 AM Performed by: Marybelle Killings Authorized by: Rodell Perna C   Consent Given by:  Patient Indications:  Pain and joint swelling Location:  Knee Site:  L knee Needle Size:  22 G Needle Length:  1.5 inches Approach:  Superolateral Ultrasound Guidance: No   Fluoroscopic Guidance: No   Arthrogram: No Medications:  1 mL lidocaine 1 % Aspiration Attempted: Yes   Aspirate:  Blood-tinged Patient tolerance:  Patient tolerated the procedure well with no immediate complications     Clinical Data: No additional findings.   Subjective: No chief complaint on file.   S/P Postop Left total knee arthroplasty 07/23/16. 2 weeks 5 day Post op. Patient states he is having a lot of pain. 10/10pain level. He has some burning. A little warm to touch. Staples intact. Incision looks well. Yesterday evening  is when his pain level went up.knee popped. Ambulates with walker. He is taking hydrocodone. He took the last pill this morning. Patient states that ever since he had surgery he lost several pounds. When having pain he starts to feel sick.  Patient rates his pain at 10 out of 10 and is present around the clock. He states he can't sleep he is taking hydrocodone. Percocet previously made him hallucinate. Patient has been severe for 3 days constant. No drainage from his knee no fever or chills  Review of Systems  Constitutional: Negative for chills and diaphoresis.  HENT: Negative for ear discharge, ear pain and nosebleeds.   Eyes: Negative for discharge and visual disturbance.  Respiratory: Negative for cough, choking and shortness of breath.   Cardiovascular: Negative for chest pain and palpitations.  Gastrointestinal: Negative for abdominal distention and abdominal pain.  Endocrine: Negative for cold intolerance and heat intolerance.  Genitourinary: Negative for flank pain and hematuria.  Skin: Negative for rash and wound.  Neurological: Negative for seizures and speech difficulty.  Hematological: Negative for adenopathy. Does not bruise/bleed easily.  Psychiatric/Behavioral: Negative for agitation and suicidal ideas.     Objective: Vital Signs: BP (!) 80/53   Pulse (!) 102   Physical Exam  Constitutional: He is oriented to person, place, and time. He appears well-developed  and well-nourished.  HENT:  Head: Normocephalic and atraumatic.  Eyes: EOM are normal. Pupils are equal, round, and reactive to light.  Neck: No tracheal deviation present. No thyromegaly present.  Cardiovascular: Normal rate.   Pulmonary/Chest: Effort normal. He has no wheezes.  Abdominal: Soft. Bowel sounds are normal.  Neurological: He is alert and oriented to person, place, and time.  Skin: Skin is warm and dry. Capillary refill takes less than 2 seconds.  Psychiatric: He has a normal mood and affect. His  behavior is normal. Judgment and thought content normal.    Ortho Exam knee incision is well-healed with midline staples. There is a slight knee effusion. His knee position is at 45 he is able to move it about 5 in both directions. He has lost significant motion. Negative Tenderness ankle flexion dorsiflexion pulses are normal. Negative [place]. No pain with hip range of motion. Passive range of motion is extremely painful.  Specialty Comments:  No specialty comments available.  Imaging: Xr Knee 1-2 Views Left  Result Date: 08/11/2016 AP and lateral x-ray obtained to the patient's sudden pop with severe increased left knee pain. This shows well-placed cemented total knee arthroplasty no evidence of fracture. Impression: Postop total knee in good position and alignment.    PMFS History: Patient Active Problem List   Diagnosis Date Noted  . Unilateral primary osteoarthritis, left knee 07/23/2016  . Hyperlipidemia 09/04/2014  . Thrombocytopenia ([place])   . Precordial chest pain 04/05/2014  . Coronary atherosclerosis of native coronary artery 10/01/2013  . Other and unspecified hyperlipidemia 10/01/2013  . Essential hypertension, benign 10/01/2013  . Insulin dependent type 2 diabetes mellitus ([place]) 10/01/2013  . Esophageal reflux 10/01/2013  . Hypertrophy of prostate without urinary obstruction and other lower urinary tract symptoms (LUTS) 10/01/2013   Past Medical History:  Diagnosis Date  . Allergic rhinitis   . Allergic rhinitis   . Arthritis   . Chronic leg pain    right  . Chronic lower back pain   . Coronary artery disease    a. Stenting to RCA 2004; staged DES to LAD and Cx 2004. DES to mRCA 2012. b. DES to mCx, PTCA to dCx 11/2011. c. Lateral wall MI 2013 s/p PTCA to distal Cx & DES to mid OM2 11/2011. d. Low risk nuc 04/2014, EF wnl.  . Diabetes mellitus    Insulin dependent  . Diabetic neuropathy (HCC)    MILD  . Diverticulosis   . Dysrhythmia   . Gilbert syndrome   .  Gout    right wrist; right foot; right elbow; have had it since 1970's  . H/O hiatal hernia   . Heart murmur   . History of echocardiogram    aortic sclerosis per echo 12/09 EF 65%, otherwise normal  . History of hemorrhoids    BLEEDING  . Hypertension    Diagnosed 1995   . Pancreatic pseudocyst    a. s/p remote drainage 2006.  Marland Kitchen Thrombocytopenia ([place])    Seen on oldest labs in system from 2004  . Vitamin B 12 deficiency    orally replaced    Family History  Problem Relation Age of Onset  . Diabetes Mother   . Hyperlipidemia Mother   . Hypertension Mother   . Cancer Father   . Hypertension Father   . Diabetes Sister   . Hypertension Sister   . Cancer Brother   . Heart attack Neg Hx     Past Surgical History:  Procedure Laterality Date  .  BACK SURGERY     "total of 3 times" S/P fall   . CARPAL TUNNEL RELEASE Bilateral   . CHOLECYSTECTOMY  1990's  . COLONOSCOPY    . CORONARY ANGIOPLASTY  11/11/11  . CORONARY ANGIOPLASTY WITH STENT PLACEMENT  09/30/2011   "1 then; makes a total of 4"  . CORONARY ANGIOPLASTY WITH STENT PLACEMENT  11/11/11   "1; makes a total of 5"  . INGUINAL HERNIA REPAIR  2003   right  . JOINT REPLACEMENT Right 04/03/2002   hip replacment  . KNEE ARTHROSCOPY  1990's   left  . LEFT HEART CATHETERIZATION WITH CORONARY ANGIOGRAM N/A 09/30/2011   Procedure: LEFT HEART CATHETERIZATION WITH CORONARY ANGIOGRAM;  Surgeon: Jettie Booze, MD;  Location: [facility] CATH LAB;  Service: Cardiovascular;  Laterality: N/A;  possible PCI  . LEFT HEART CATHETERIZATION WITH CORONARY ANGIOGRAM N/A 11/15/2011   Procedure: LEFT HEART CATHETERIZATION WITH CORONARY ANGIOGRAM;  Surgeon: Jettie Booze, MD;  Location: [facility] CATH LAB;  Service: Cardiovascular;  Laterality: N/A;  . PERCUTANEOUS CORONARY STENT INTERVENTION (PCI-S)  09/30/2011   Procedure: PERCUTANEOUS CORONARY STENT INTERVENTION (PCI-S);  Surgeon: Jettie Booze, MD;  Location: [facility] CATH LAB;  Service:  Cardiovascular;;  . PERCUTANEOUS CORONARY STENT INTERVENTION (PCI-S) N/A 11/11/2011   Procedure: PERCUTANEOUS CORONARY STENT INTERVENTION (PCI-S);  Surgeon: Jettie Booze, MD;  Location: [facility] CATH LAB;  Service: Cardiovascular;  Laterality: N/A;  . SHOULDER SURGERY Right    X 2  . TONSILLECTOMY  ~ 1948  . TOTAL KNEE ARTHROPLASTY Left 07/23/2016   Procedure: LEFT TOTAL KNEE ARTHROPLASTY;  Surgeon: Marybelle Killings, MD;  Location: [place];  Service: Orthopedics;  Laterality: Left;   Social History   Occupational History  . Retired    Social History Main Topics  . Smoking status: Former Research scientist (life sciences)  . Smokeless tobacco: Current User    Types: Chew  . Alcohol use No  . Drug use: No  . Sexual activity: Not on file

## 2016-08-12 DIAGNOSIS — Z471 Aftercare following joint replacement surgery: Secondary | ICD-10-CM | POA: Diagnosis not present

## 2016-08-12 DIAGNOSIS — E1142 Type 2 diabetes mellitus with diabetic polyneuropathy: Secondary | ICD-10-CM | POA: Diagnosis not present

## 2016-08-12 DIAGNOSIS — I10 Essential (primary) hypertension: Secondary | ICD-10-CM | POA: Diagnosis not present

## 2016-08-12 DIAGNOSIS — M1712 Unilateral primary osteoarthritis, left knee: Secondary | ICD-10-CM | POA: Diagnosis not present

## 2016-08-12 DIAGNOSIS — I2510 Atherosclerotic heart disease of native coronary artery without angina pectoris: Secondary | ICD-10-CM | POA: Diagnosis not present

## 2016-08-12 DIAGNOSIS — Z96652 Presence of left artificial knee joint: Secondary | ICD-10-CM | POA: Diagnosis not present

## 2016-08-17 ENCOUNTER — Ambulatory Visit: Payer: Medicare Other | Attending: Orthopaedic Surgery | Admitting: Physical Therapy

## 2016-08-17 DIAGNOSIS — M25562 Pain in left knee: Secondary | ICD-10-CM | POA: Insufficient documentation

## 2016-08-17 DIAGNOSIS — R600 Localized edema: Secondary | ICD-10-CM

## 2016-08-17 DIAGNOSIS — M25662 Stiffness of left knee, not elsewhere classified: Secondary | ICD-10-CM | POA: Diagnosis not present

## 2016-08-17 NOTE — Therapy (Signed)
[name], [place], [address] Phone: [phone]   Fax:  [phone]  Physical Therapy Evaluation  Patient Details  Name: Keith Espinoza MRN: [id number] Date of Birth: [birth year] Referring Provider: [name].  Encounter Date: 08/17/2016      PT End of Session - 08/17/16 1310    Visit Number 1   Number of Visits 16   Date for PT Re-Evaluation 10/16/16   PT Start Time 1021   PT Stop Time 1130   PT Time Calculation (min) 69 min   Activity Tolerance Patient tolerated treatment well   Behavior During Therapy [facility] for tasks assessed/performed      Past Medical History:  Diagnosis Date  . Allergic rhinitis   . Allergic rhinitis   . Arthritis   . Chronic leg pain    right  . Chronic lower back pain   . Coronary artery disease    a. Stenting to RCA 2004; staged DES to LAD and Cx 2004. DES to mRCA 2012. b. DES to mCx, PTCA to dCx 11/2011. c. Lateral wall MI 2013 s/p PTCA to distal Cx & DES to mid OM2 11/2011. d. Low risk nuc 04/2014, EF wnl.  . Diabetes mellitus    Insulin dependent  . Diabetic neuropathy (HCC)    MILD  . Diverticulosis   . Dysrhythmia   . Gilbert syndrome   . Gout    right wrist; right foot; right elbow; have had it since 1970's  . H/O hiatal hernia   . Heart murmur   . History of echocardiogram    aortic sclerosis per echo 12/09 EF 65%, otherwise normal  . History of hemorrhoids    BLEEDING  . Hypertension    Diagnosed 1995   . Pancreatic pseudocyst    a. s/p remote drainage 2006.  Marland Kitchen Thrombocytopenia ([place])    Seen on oldest labs in system from 2004  . Vitamin B 12 deficiency    orally replaced    Past Surgical History:  Procedure Laterality Date  . BACK SURGERY     "total of 3 times" S/P fall   . CARPAL TUNNEL RELEASE Bilateral   . CHOLECYSTECTOMY  1990's  . COLONOSCOPY    . CORONARY ANGIOPLASTY  11/11/11  . CORONARY ANGIOPLASTY WITH STENT PLACEMENT  09/30/2011   "1 then;  makes a total of 4"  . CORONARY ANGIOPLASTY WITH STENT PLACEMENT  11/11/11   "1; makes a total of 5"  . INGUINAL HERNIA REPAIR  2003   right  . JOINT REPLACEMENT Right 04/03/2002   hip replacment  . KNEE ARTHROSCOPY  1990's   left  . LEFT HEART CATHETERIZATION WITH CORONARY ANGIOGRAM N/A 09/30/2011   Procedure: LEFT HEART CATHETERIZATION WITH CORONARY ANGIOGRAM;  Surgeon: Jettie Booze, MD;  Location: [facility] CATH LAB;  Service: Cardiovascular;  Laterality: N/A;  possible PCI  . LEFT HEART CATHETERIZATION WITH CORONARY ANGIOGRAM N/A 11/15/2011   Procedure: LEFT HEART CATHETERIZATION WITH CORONARY ANGIOGRAM;  Surgeon: Jettie Booze, MD;  Location: [facility] CATH LAB;  Service: Cardiovascular;  Laterality: N/A;  . PERCUTANEOUS CORONARY STENT INTERVENTION (PCI-S)  09/30/2011   Procedure: PERCUTANEOUS CORONARY STENT INTERVENTION (PCI-S);  Surgeon: Jettie Booze, MD;  Location: [facility] CATH LAB;  Service: Cardiovascular;;  . PERCUTANEOUS CORONARY STENT INTERVENTION (PCI-S) N/A 11/11/2011   Procedure: PERCUTANEOUS CORONARY STENT INTERVENTION (PCI-S);  Surgeon: Jettie Booze, MD;  Location: [facility] CATH LAB;  Service: Cardiovascular;  Laterality: N/A;  . SHOULDER SURGERY Right  X 2  . TONSILLECTOMY  ~ 1948  . TOTAL KNEE ARTHROPLASTY Left 07/23/2016   Procedure: LEFT TOTAL KNEE ARTHROPLASTY;  Surgeon: Marybelle Killings, MD;  Location: [place];  Service: Orthopedics;  Laterality: Left;    There were no vitals filed for this visit.       Subjective Assessment - 08/17/16 1106    Subjective The patient prsents to OPPT s/p left total knee replacement performed on 07/31/16.  She had some home health and is doing the home exercises he was prescribed.  His pain-level is a [id number] but certainly increases with range of motion activites.  He uses a walker but around the house he has gone without it.   Patient Stated Goals Get back to normal.   Pain Score 4    Pain Location Knee   Pain Orientation Left   Pain  Descriptors / Indicators Aching   Pain Type Surgical pain   Pain Frequency Constant            [facility] PT Assessment - 08/17/16 0001      Assessment   Medical Diagnosis Left total knee replacement.   Referring Provider [name].   Onset Date/Surgical Date --  07/31/16 (surgery date).     Precautions   Precautions --  No ultrasound.     Restrictions   Weight Bearing Restrictions No     Balance Screen   Has the patient fallen in the past 6 months No   Has the patient had a decrease in activity level because of a fear of falling?  Yes   Is the patient reluctant to leave their home because of a fear of falling?  No     Home Environment   Living Environment Private residence     Prior Function   Level of Independence Independent     Observation/Other Assessments   Observations Left knee steri-strips intact.     ROM / Strength   AROM / PROM / Strength AROM;Strength     AROM   Overall AROM Comments left knee extension actively to -18 degrees and passive to -14 degrees with active flexion to 75 degrees and passive to 80 degrees.     Strength   Overall Strength Comments Right hip and knee strength= 4 to 4+/5.     Palpation   Palpation comment Diffuse left knee tenderness and in area of heads on gastrocnemius.     Special Tests    Special Tests --  Edema 2 cm > RT at mid-patellar region.     Ambulation/Gait   Gait Pattern Decreased stance time - left;Antalgic                   [facility] Adult PT Treatment/Exercise - 08/17/16 0001      Exercises   Exercises Knee/Hip     Knee/Hip Exercises: Aerobic   Nustep Level 3 moving forward x 2 to increase knee flexion.     Modalities   Modalities Location manager Stimulation Location Left knee.   Electrical Stimulation Action IFC   Electrical Stimulation Parameters 1-10 Hz x 15 minutes.   Electrical Stimulation Goals Edema;Pain     Vasopneumatic    Number Minutes Vasopneumatic  15 minutes   Vasopnuematic Location  --  Left knee.   Vasopneumatic Pressure Medium                  PT Short Term Goals - 08/17/16 1325  PT SHORT TERM GOAL #1   Title Independent with an initial HEP.   Time 4   Period Weeks   Status New     PT SHORT TERM GOAL #2   Title Active left knee extension= -5 degrees to help normalize gait pattern.   Time 4   Period Weeks   Status New           PT Long Term Goals - 08/17/16 1326      PT LONG TERM GOAL #1   Title Independent with an advanced HEP.   Time 8   Period Weeks   Status New     PT LONG TERM GOAL #2   Title Active left knee flexion to 115 degrees+ so the patient can perform functional tasks and do so with pain not > 2-3/10.   Time 8   Period Weeks   Status New     PT LONG TERM GOAL #3   Title Perform a reciprocating stair gait with one railing with pain not > 2-3/10.   Time 8   Period Weeks   Status New               Plan - 08/17/16 1314    Clinical Impression Statement The patient presents with left knee edema and loss of flexion and extension.  He is currently using a SW when out in community.  His steri-strips are still intact.     Rehab Potential Excellent   PT Frequency 3x / week   PT Duration 8 weeks   PT Treatment/Interventions ADLs/Self Care Home Management;Cryotherapy;Electrical Stimulation;Therapeutic activities;Therapeutic exercise;Patient/family education;Passive range of motion;Manual techniques;Vasopneumatic Device   PT Next Visit Plan Nustep; PROM to patient's left knee; TKA protocol.  E'stim and vasopneumatic.  No ultrasound.      Patient will benefit from skilled therapeutic intervention in order to improve the following deficits and impairments:  Pain, Decreased activity tolerance, Decreased range of motion, Decreased strength, Increased edema  Visit Diagnosis: Acute pain of left knee - Plan: PT plan of care cert/re-cert  Stiffness of  left knee, not elsewhere classified - Plan: PT plan of care cert/re-cert  Localized edema - Plan: PT plan of care cert/re-cert      G-Codes - [id number]    Functional Assessment Tool Used Clinical judgement....   Functional Limitation Mobility: Walking and moving around   Mobility: Walking and Moving Around Current Status [phone]) At least 40 percent but less than 60 percent impaired, limited or restricted   Mobility: Walking and Moving Around Goal Status [phone]) At least 1 percent but less than 20 percent impaired, limited or restricted       Problem List Patient Active Problem List   Diagnosis Date Noted  . Unilateral primary osteoarthritis, left knee 07/23/2016  . Hyperlipidemia 09/04/2014  . Thrombocytopenia ([place])   . Precordial chest pain 04/05/2014  . Coronary atherosclerosis of native coronary artery 10/01/2013  . Other and unspecified hyperlipidemia 10/01/2013  . Essential hypertension, benign 10/01/2013  . Insulin dependent type 2 diabetes mellitus ([place]) 10/01/2013  . Esophageal reflux 10/01/2013  . Hypertrophy of prostate without urinary obstruction and other lower urinary tract symptoms (LUTS) 10/01/2013    APPLEGATE, [place] MPT 08/17/2016, 1:29 PM  [facility] [address] [place], [place], [address] Phone: [phone]   Fax:  [phone]  Name: BERLIN FILIPIAK MRN: [id number] Date of Birth: 1940-10-13

## 2016-08-19 ENCOUNTER — Ambulatory Visit: Payer: Medicare Other | Admitting: Physical Therapy

## 2016-08-19 ENCOUNTER — Encounter: Payer: Self-pay | Admitting: Physical Therapy

## 2016-08-19 DIAGNOSIS — M25562 Pain in left knee: Secondary | ICD-10-CM

## 2016-08-19 DIAGNOSIS — M25662 Stiffness of left knee, not elsewhere classified: Secondary | ICD-10-CM | POA: Diagnosis not present

## 2016-08-19 DIAGNOSIS — R600 Localized edema: Secondary | ICD-10-CM

## 2016-08-19 NOTE — Patient Instructions (Addendum)
    Knee Flexion Stretch on Step  Place foot on step and lean forward until you feel a good stretch in front of knee.   hold 30 sec x 5-10 perform 2-4 x daily  Knee Extension Mobilization: Towel Prop   With rolled towel under right ankle, place _1-5___ pound weight across knee. Hold __5+__ minutes. Repeat __2-3__ times per set. Do __2__ sets per session. Do __2-4__ sessions per day.  Knee ext stretch    With hands above right kneecap, gently push knee down until you feel a stretch. Hold __10__ seconds. Repeat _10___ times per set. Do __5+__ sets per session. Do __2-4__ sessions per day.  Knee Extension Mobilization: Towel Prop   With rolled towel under right ankle, place _1-5___ pound weight across knee. Hold __5+__ minutes. Repeat __2-3__ times per set. Do __2__ sets per session. Do __2-4__ sessions per day.                 Knee Extension Mobilization: Hang (Prone)    With table supporting thighs, place _0___ pound weight on right ankle. Hold _1-10___ minutes. Repeat __1-3__ times per set. Do __1-3__ sets per session. Do __2-4__ sessions per day.

## 2016-08-19 NOTE — Therapy (Signed)
[facility] [place], [place], [address] Phone: [phone]   Fax:  [phone]  Physical Therapy Treatment  Patient Details  Name: Keith Espinoza MRN: [id number] Date of Birth: 06-10-1941 Referring Provider: [name].  Encounter Date: 08/19/2016      PT End of Session - 08/19/16 0909    Visit Number 2   Number of Visits 16   Date for PT Re-Evaluation 10/16/16   PT Start Time 0814   PT Stop Time 0914   PT Time Calculation (min) 60 min   Activity Tolerance Patient tolerated treatment well   Behavior During Therapy [facility] for tasks assessed/performed      Past Medical History:  Diagnosis Date  . Allergic rhinitis   . Allergic rhinitis   . Arthritis   . Chronic leg pain    right  . Chronic lower back pain   . Coronary artery disease    a. Stenting to RCA 2004; staged DES to LAD and Cx 2004. DES to mRCA 2012. b. DES to mCx, PTCA to dCx 11/2011. c. Lateral wall MI 2013 s/p PTCA to distal Cx & DES to mid OM2 11/2011. d. Low risk nuc 04/2014, EF wnl.  . Diabetes mellitus    Insulin dependent  . Diabetic neuropathy (HCC)    MILD  . Diverticulosis   . Dysrhythmia   . Gilbert syndrome   . Gout    right wrist; right foot; right elbow; have had it since 1970's  . H/O hiatal hernia   . Heart murmur   . History of echocardiogram    aortic sclerosis per echo 12/09 EF 65%, otherwise normal  . History of hemorrhoids    BLEEDING  . Hypertension    Diagnosed 1995   . Pancreatic pseudocyst    a. s/p remote drainage 2006.  Marland Kitchen Thrombocytopenia ([place])    Seen on oldest labs in system from 2004  . Vitamin B 12 deficiency    orally replaced    Past Surgical History:  Procedure Laterality Date  . BACK SURGERY     "total of 3 times" S/P fall   . CARPAL TUNNEL RELEASE Bilateral   . CHOLECYSTECTOMY  1990's  . COLONOSCOPY    . CORONARY ANGIOPLASTY  11/11/11  . CORONARY ANGIOPLASTY WITH STENT PLACEMENT  09/30/2011   "1 then;  makes a total of 4"  . CORONARY ANGIOPLASTY WITH STENT PLACEMENT  11/11/11   "1; makes a total of 5"  . INGUINAL HERNIA REPAIR  2003   right  . JOINT REPLACEMENT Right 04/03/2002   hip replacment  . KNEE ARTHROSCOPY  1990's   left  . LEFT HEART CATHETERIZATION WITH CORONARY ANGIOGRAM N/A 09/30/2011   Procedure: LEFT HEART CATHETERIZATION WITH CORONARY ANGIOGRAM;  Surgeon: Jettie Booze, MD;  Location: [facility] CATH LAB;  Service: Cardiovascular;  Laterality: N/A;  possible PCI  . LEFT HEART CATHETERIZATION WITH CORONARY ANGIOGRAM N/A 11/15/2011   Procedure: LEFT HEART CATHETERIZATION WITH CORONARY ANGIOGRAM;  Surgeon: Jettie Booze, MD;  Location: [facility] CATH LAB;  Service: Cardiovascular;  Laterality: N/A;  . PERCUTANEOUS CORONARY STENT INTERVENTION (PCI-S)  09/30/2011   Procedure: PERCUTANEOUS CORONARY STENT INTERVENTION (PCI-S);  Surgeon: Jettie Booze, MD;  Location: [facility] CATH LAB;  Service: Cardiovascular;;  . PERCUTANEOUS CORONARY STENT INTERVENTION (PCI-S) N/A 11/11/2011   Procedure: PERCUTANEOUS CORONARY STENT INTERVENTION (PCI-S);  Surgeon: Jettie Booze, MD;  Location: [facility] CATH LAB;  Service: Cardiovascular;  Laterality: N/A;  . SHOULDER SURGERY Right  X 2  . TONSILLECTOMY  ~ 1948  . TOTAL KNEE ARTHROPLASTY Left 07/23/2016   Procedure: LEFT TOTAL KNEE ARTHROPLASTY;  Surgeon: Marybelle Killings, MD;  Location: [place];  Service: Orthopedics;  Laterality: Left;    There were no vitals filed for this visit.      Subjective Assessment - 08/19/16 0823    Subjective Patient arrived with reported stiffness and pain in knee   Patient Stated Goals Get back to normal.   Currently in Pain? Yes   Pain Score 7    Pain Location Knee   Pain Orientation Left   Pain Descriptors / Indicators Aching   Pain Type Surgical pain   Pain Frequency Constant   Aggravating Factors  ROM or increased activity   Pain Relieving Factors rest            [facility] PT Assessment - 08/19/16 0001       ROM / Strength   AROM / PROM / Strength AROM;PROM     AROM   AROM Assessment Site Knee   Right/Left Knee Left   Left Knee Extension 15   Left Knee Flexion 80     PROM   PROM Assessment Site Knee   Right/Left Knee Left   Left Knee Extension -10   Left Knee Flexion 86                     [facility] Adult PT Treatment/Exercise - 08/19/16 0001      Knee/Hip Exercises: Aerobic   Nustep L4 x61mn UE/LE, monitored for progression     Knee/Hip Exercises: Standing   Rocker Board 2 minutes     Electrical Stimulation   Electrical Stimulation Location --  did not use due patient unable to feel ES   EPrintmakerAction --   Electrical Stimulation Parameters --   Electrical Stimulation Goals --     Vasopneumatic   Number Minutes Vasopneumatic  15 minutes   Vasopnuematic Location  Knee   Vasopneumatic Pressure Medium     Manual Therapy   Manual Therapy Passive ROM   Passive ROM manual stretching for left knee flexion and ext with low load holds                PT Education - 08/19/16 0917    Education provided Yes   Education Details HEP   Person(s) Educated Patient   Methods Explanation;Demonstration;Handout   Comprehension Verbalized understanding;Returned demonstration          PT Short Term Goals - 08/19/16 0910      PT SHORT TERM GOAL #1   Title Independent with an initial HEP.   Time 4   Period Weeks   Status Achieved     PT SHORT TERM GOAL #2   Title Active left knee extension= -5 degrees to help normalize gait pattern.   Time 4   Period Weeks   Status On-going  AROM -15 degrees 08/19/16           PT Long Term Goals - 08/17/16 1326      PT LONG TERM GOAL #1   Title Independent with an advanced HEP.   Time 8   Period Weeks   Status New     PT LONG TERM GOAL #2   Title Active left knee flexion to 115 degrees+ so the patient can perform functional tasks and do so with pain not > 2-3/10.   Time 8   Period Weeks   Status  New     PT LONG TERM GOAL #3   Title Perform a reciprocating stair gait with one railing with pain not > 2-3/10.   Time 8   Period Weeks   Status New               Plan - 08/19/16 1031    Clinical Impression Statement Patient tolerated treatment well today. Patient had increased tightness in knee today esp for left knee flexion. Focused on ROM today to increase range and functional independence. HEP given for self stretching with patient independent with. STG #1 met others ongoing due to ROM, pain and strength deficts.    Rehab Potential Excellent   PT Frequency 3x / week   PT Duration 8 weeks   PT Next Visit Plan Nustep; PROM to patient's left knee; TKA protocol.  E'stim and vasopneumatic.  No ultrasound.   Consulted and Agree with Plan of Care Patient      Patient will benefit from skilled therapeutic intervention in order to improve the following deficits and impairments:  Pain, Decreased activity tolerance, Decreased range of motion, Decreased strength, Increased edema  Visit Diagnosis: Acute pain of left knee  Stiffness of left knee, not elsewhere classified  Localized edema     Problem List Patient Active Problem List   Diagnosis Date Noted  . Unilateral primary osteoarthritis, left knee 07/23/2016  . Hyperlipidemia 09/04/2014  . Thrombocytopenia ([place])   . Precordial chest pain 04/05/2014  . Coronary atherosclerosis of native coronary artery 10/01/2013  . Other and unspecified hyperlipidemia 10/01/2013  . Essential hypertension, benign 10/01/2013  . Insulin dependent type 2 diabetes mellitus ([place]) 10/01/2013  . Esophageal reflux 10/01/2013  . Hypertrophy of prostate without urinary obstruction and other lower urinary tract symptoms (LUTS) 10/01/2013    DUNFORD, CHRISTINA P, PTA 08/19/2016, 9:22 AM  [facility] [place], [place], [address] Phone: [phone]   Fax:  [phone]  Name: PERCELL LAMBOY MRN: [id number] Date of Birth: [birth year]

## 2016-08-24 ENCOUNTER — Ambulatory Visit: Payer: Medicare Other | Admitting: Physical Therapy

## 2016-08-24 ENCOUNTER — Encounter: Payer: Self-pay | Admitting: Physical Therapy

## 2016-08-24 DIAGNOSIS — M25662 Stiffness of left knee, not elsewhere classified: Secondary | ICD-10-CM | POA: Diagnosis not present

## 2016-08-24 DIAGNOSIS — M25562 Pain in left knee: Secondary | ICD-10-CM

## 2016-08-24 DIAGNOSIS — R600 Localized edema: Secondary | ICD-10-CM

## 2016-08-24 NOTE — Therapy (Signed)
[facility] [place], [place], [address] Phone: [phone]   Fax:  [phone]  Physical Therapy Treatment  Patient Details  Name: Keith Espinoza MRN: [id number] Date of Birth: August 26, 1941 Referring Provider: [name].  Encounter Date: 08/24/2016      PT End of Session - 08/24/16 0742    Visit Number 3   Number of Visits 16   Date for PT Re-Evaluation 10/16/16   PT Start Time 0739   PT Stop Time 0827   PT Time Calculation (min) 48 min   Activity Tolerance Patient tolerated treatment well   Behavior During Therapy [facility] for tasks assessed/performed      Past Medical History:  Diagnosis Date  . Allergic rhinitis   . Allergic rhinitis   . Arthritis   . Chronic leg pain    right  . Chronic lower back pain   . Coronary artery disease    a. Stenting to RCA 2004; staged DES to LAD and Cx 2004. DES to mRCA 2012. b. DES to mCx, PTCA to dCx 11/2011. c. Lateral wall MI 2013 s/p PTCA to distal Cx & DES to mid OM2 11/2011. d. Low risk nuc 04/2014, EF wnl.  . Diabetes mellitus    Insulin dependent  . Diabetic neuropathy (HCC)    MILD  . Diverticulosis   . Dysrhythmia   . Gilbert syndrome   . Gout    right wrist; right foot; right elbow; have had it since 1970's  . H/O hiatal hernia   . Heart murmur   . History of echocardiogram    aortic sclerosis per echo 12/09 EF 65%, otherwise normal  . History of hemorrhoids    BLEEDING  . Hypertension    Diagnosed 1995   . Pancreatic pseudocyst    a. s/p remote drainage 2006.  Marland Kitchen Thrombocytopenia ([place])    Seen on oldest labs in system from 2004  . Vitamin B 12 deficiency    orally replaced    Past Surgical History:  Procedure Laterality Date  . BACK SURGERY     "total of 3 times" S/P fall   . CARPAL TUNNEL RELEASE Bilateral   . CHOLECYSTECTOMY  1990's  . COLONOSCOPY    . CORONARY ANGIOPLASTY  11/11/11  . CORONARY ANGIOPLASTY WITH STENT PLACEMENT  09/30/2011   "1 then;  makes a total of 4"  . CORONARY ANGIOPLASTY WITH STENT PLACEMENT  11/11/11   "1; makes a total of 5"  . INGUINAL HERNIA REPAIR  2003   right  . JOINT REPLACEMENT Right 04/03/2002   hip replacment  . KNEE ARTHROSCOPY  1990's   left  . LEFT HEART CATHETERIZATION WITH CORONARY ANGIOGRAM N/A 09/30/2011   Procedure: LEFT HEART CATHETERIZATION WITH CORONARY ANGIOGRAM;  Surgeon: Jettie Booze, MD;  Location: [facility] CATH LAB;  Service: Cardiovascular;  Laterality: N/A;  possible PCI  . LEFT HEART CATHETERIZATION WITH CORONARY ANGIOGRAM N/A 11/15/2011   Procedure: LEFT HEART CATHETERIZATION WITH CORONARY ANGIOGRAM;  Surgeon: Jettie Booze, MD;  Location: [facility] CATH LAB;  Service: Cardiovascular;  Laterality: N/A;  . PERCUTANEOUS CORONARY STENT INTERVENTION (PCI-S)  09/30/2011   Procedure: PERCUTANEOUS CORONARY STENT INTERVENTION (PCI-S);  Surgeon: Jettie Booze, MD;  Location: [facility] CATH LAB;  Service: Cardiovascular;;  . PERCUTANEOUS CORONARY STENT INTERVENTION (PCI-S) N/A 11/11/2011   Procedure: PERCUTANEOUS CORONARY STENT INTERVENTION (PCI-S);  Surgeon: Jettie Booze, MD;  Location: [facility] CATH LAB;  Service: Cardiovascular;  Laterality: N/A;  . SHOULDER SURGERY Right  X 2  . TONSILLECTOMY  ~ 1948  . TOTAL KNEE ARTHROPLASTY Left 07/23/2016   Procedure: LEFT TOTAL KNEE ARTHROPLASTY;  Surgeon: Marybelle Killings, MD;  Location: [place];  Service: Orthopedics;  Laterality: Left;    There were no vitals filed for this visit.      Subjective Assessment - 08/24/16 0742    Subjective Reports that he has some L calf tightness and reported sharp sensation in L knee with nustep seat adjustment.   Patient Stated Goals Get back to normal.   Currently in Pain? Yes   Pain Score 3    Pain Location Knee   Pain Orientation Left   Pain Descriptors / Indicators --  L calf soreness   Pain Type Surgical pain            [facility] PT Assessment - 08/24/16 0001      Assessment   Medical Diagnosis Left  total knee replacement.   Onset Date/Surgical Date 07/31/16     Restrictions   Weight Bearing Restrictions No     AROM   Overall AROM  Deficits   AROM Assessment Site Knee   Right/Left Knee Left   Left Knee Extension -11   Left Knee Flexion 82                     [facility] Adult PT Treatment/Exercise - 08/24/16 0001      Knee/Hip Exercises: Stretches   Active Hamstring Stretch Left;3 reps;20 seconds     Knee/Hip Exercises: Aerobic   Nustep L4, seat 12 x15 min     Knee/Hip Exercises: Standing   Forward Lunges Left;2 sets;10 reps;2 seconds   Rocker Board 2 minutes     Modalities   Modalities Vasopneumatic     Vasopneumatic   Number Minutes Vasopneumatic  15 minutes   Vasopnuematic Location  Knee   Vasopneumatic Pressure Medium   Vasopneumatic Temperature  63     Manual Therapy   Manual Therapy Passive ROM;Soft tissue mobilization   Soft tissue mobilization L patellar mobilizations to promote proper mobility   Passive ROM PROM of L knee into flex/ext with gentle holds at end range                  PT Short Term Goals - 08/19/16 0910      PT SHORT TERM GOAL #1   Title Independent with an initial HEP.   Time 4   Period Weeks   Status Achieved     PT SHORT TERM GOAL #2   Title Active left knee extension= -5 degrees to help normalize gait pattern.   Time 4   Period Weeks   Status On-going  AROM -15 degrees 08/19/16           PT Long Term Goals - 08/17/16 1326      PT LONG TERM GOAL #1   Title Independent with an advanced HEP.   Time 8   Period Weeks   Status New     PT LONG TERM GOAL #2   Title Active left knee flexion to 115 degrees+ so the patient can perform functional tasks and do so with pain not > 2-3/10.   Time 8   Period Weeks   Status New     PT LONG TERM GOAL #3   Title Perform a reciprocating stair gait with one railing with pain not > 2-3/10.   Time 8   Period Weeks   Status New  Plan -  08/24/16 0814    Clinical Impression Statement Patient able to tolerate today's treatment fairly well as he is limited with ROM and shows hesitancy to exceed ROM beyond discomfort. L knee flexion noted in resting supine and in standing. Patient continues to use FWW for ambulation at this time secondary to fear of falling per patient report. Good L patellar mobility noted today although PROM limited at this time in both flexion and extension.     Rehab Potential Excellent   PT Frequency 3x / week   PT Duration 8 weeks   PT Treatment/Interventions ADLs/Self Care Home Management;Cryotherapy;Electrical Stimulation;Therapeutic activities;Therapeutic exercise;Patient/family education;Passive range of motion;Manual techniques;Vasopneumatic Device   PT Next Visit Plan Continue per L TKR protcol with modalities PRN per MPT POC.   Consulted and Agree with Plan of Care Patient      Patient will benefit from skilled therapeutic intervention in order to improve the following deficits and impairments:  Pain, Decreased activity tolerance, Decreased range of motion, Decreased strength, Increased edema  Visit Diagnosis: Acute pain of left knee  Stiffness of left knee, not elsewhere classified  Localized edema     Problem List Patient Active Problem List   Diagnosis Date Noted  . Unilateral primary osteoarthritis, left knee 07/23/2016  . Hyperlipidemia 09/04/2014  . Thrombocytopenia ([place])   . Precordial chest pain 04/05/2014  . Coronary atherosclerosis of native coronary artery 10/01/2013  . Other and unspecified hyperlipidemia 10/01/2013  . Essential hypertension, benign 10/01/2013  . Insulin dependent type 2 diabetes mellitus ([place]) 10/01/2013  . Esophageal reflux 10/01/2013  . Hypertrophy of prostate without urinary obstruction and other lower urinary tract symptoms (LUTS) 10/01/2013    Ahmed Prima, PTA 08/24/16 10:07 AM Mali Applegate MPT [facility] [place], [place], [address] Phone: [phone]   Fax:  [phone]  Name: RYEN VAZGUEZ MRN: [id number] Date of Birth: [birth year]

## 2016-08-25 ENCOUNTER — Ambulatory Visit (INDEPENDENT_AMBULATORY_CARE_PROVIDER_SITE_OTHER): Payer: Medicare Other | Admitting: Orthopaedic Surgery

## 2016-08-25 ENCOUNTER — Encounter (INDEPENDENT_AMBULATORY_CARE_PROVIDER_SITE_OTHER): Payer: Self-pay | Admitting: Orthopaedic Surgery

## 2016-08-25 VITALS — BP 148/66 | HR 68 | Ht 69.0 in | Wt 181.0 lb

## 2016-08-25 DIAGNOSIS — M1712 Unilateral primary osteoarthritis, left knee: Secondary | ICD-10-CM

## 2016-08-25 NOTE — Progress Notes (Signed)
Office Visit Note   Patient: Keith Espinoza           Date of Birth: 06-30-1941           MRN: [id number] Visit Date: 08/25/2016              Requested by: [name], MD [address] [place], [place] [address] PCP: Henrine Screws, MD   Assessment & Plan: Visit Diagnoses:  1. Unilateral primary osteoarthritis, left knee    Postop total knee arthroplasty 07/23/2016. Since last visit pain got significantly better swelling is down and he thinks it was a staple that was causing pain. This is a staples removed his pain got significantly better. Range of motion is improved. Continue outpatient therapy recheck 4 weeks. Plan: He'll return in 4 weeks for recheck. He'll work hard on getting that last 10 of extension.  Follow-Up Instructions: Return in about 4 weeks (around 09/22/2016).   Orders:  No orders of the defined types were placed in this encounter.  No orders of the defined types were placed in this encounter.     Procedures: No procedures performed   Clinical Data: No additional findings.   Subjective: Chief Complaint  Patient presents with  . Left Knee - Follow-up, Routine Post Op    Lt TKA 07/23/16 [redacted]w[redacted]d post op    Patient presents for follow up left total knee arthroplasty. He is 4weeks 5 days post op. He is status post knee aspiration 08/11/16. He is feeling much better today. He is wanting to know if he can drive since he is off pain medication for two weeks now and is just taking tylenol on prn basis. He is also wanting to know if he has to continue ambulating with a walker.     Review of Systems unchanged from before surgery   Objective: Vital Signs: BP (!) 148/66 (BP Location: Right Arm, Patient Position: Sitting)   Pulse 68   Ht 5\' 9"  (1.753 m)   Wt 181 lb (82.1 kg)   BMI 26.73 kg/m   Physical Exam swelling is down the incision  Steri-Strips removed  Ortho Exam he's flexing the office almost a 90. Quad strength is improving  his office pain medicine.  Specialty Comments:  No specialty comments available.  Imaging: No results found.   PMFS History: Patient Active Problem List   Diagnosis Date Noted  . Unilateral primary osteoarthritis, left knee 07/23/2016  . Hyperlipidemia 09/04/2014  . Thrombocytopenia ([place])   . Precordial chest pain 04/05/2014  . Coronary atherosclerosis of native coronary artery 10/01/2013  . Other and unspecified hyperlipidemia 10/01/2013  . Essential hypertension, benign 10/01/2013  . Insulin dependent type 2 diabetes mellitus ([place]) 10/01/2013  . Esophageal reflux 10/01/2013  . Hypertrophy of prostate without urinary obstruction and other lower urinary tract symptoms (LUTS) 10/01/2013   Past Medical History:  Diagnosis Date  . Allergic rhinitis   . Allergic rhinitis   . Arthritis   . Chronic leg pain    right  . Chronic lower back pain   . Coronary artery disease    a. Stenting to RCA 2004; staged DES to LAD and Cx 2004. DES to mRCA 2012. b. DES to mCx, PTCA to dCx 11/2011. c. Lateral wall MI 2013 s/p PTCA to distal Cx & DES to mid OM2 11/2011. d. Low risk nuc 04/2014, EF wnl.  . Diabetes mellitus    Insulin dependent  . Diabetic neuropathy (HCC)    MILD  .  Diverticulosis   . Dysrhythmia   . Gilbert syndrome   . Gout    right wrist; right foot; right elbow; have had it since 1970's  . H/O hiatal hernia   . Heart murmur   . History of echocardiogram    aortic sclerosis per echo 12/09 EF 65%, otherwise normal  . History of hemorrhoids    BLEEDING  . Hypertension    Diagnosed 1995   . Pancreatic pseudocyst    a. s/p remote drainage 2006.  Marland Kitchen Thrombocytopenia ([place])    Seen on oldest labs in system from 2004  . Vitamin B 12 deficiency    orally replaced    Family History  Problem Relation Age of Onset  . Diabetes Mother   . Hyperlipidemia Mother   . Hypertension Mother   . Cancer Father   . Hypertension Father   . Diabetes Sister   . Hypertension Sister   .  Cancer Brother   . Heart attack Neg Hx     Past Surgical History:  Procedure Laterality Date  . BACK SURGERY     "total of 3 times" S/P fall   . CARPAL TUNNEL RELEASE Bilateral   . CHOLECYSTECTOMY  1990's  . COLONOSCOPY    . CORONARY ANGIOPLASTY  11/11/11  . CORONARY ANGIOPLASTY WITH STENT PLACEMENT  09/30/2011   "1 then; makes a total of 4"  . CORONARY ANGIOPLASTY WITH STENT PLACEMENT  11/11/11   "1; makes a total of 5"  . INGUINAL HERNIA REPAIR  2003   right  . JOINT REPLACEMENT Right 04/03/2002   hip replacment  . KNEE ARTHROSCOPY  1990's   left  . LEFT HEART CATHETERIZATION WITH CORONARY ANGIOGRAM N/A 09/30/2011   Procedure: LEFT HEART CATHETERIZATION WITH CORONARY ANGIOGRAM;  Surgeon: Jettie Booze, MD;  Location: [facility] CATH LAB;  Service: Cardiovascular;  Laterality: N/A;  possible PCI  . LEFT HEART CATHETERIZATION WITH CORONARY ANGIOGRAM N/A 11/15/2011   Procedure: LEFT HEART CATHETERIZATION WITH CORONARY ANGIOGRAM;  Surgeon: Jettie Booze, MD;  Location: [facility] CATH LAB;  Service: Cardiovascular;  Laterality: N/A;  . PERCUTANEOUS CORONARY STENT INTERVENTION (PCI-S)  09/30/2011   Procedure: PERCUTANEOUS CORONARY STENT INTERVENTION (PCI-S);  Surgeon: Jettie Booze, MD;  Location: [facility] CATH LAB;  Service: Cardiovascular;;  . PERCUTANEOUS CORONARY STENT INTERVENTION (PCI-S) N/A 11/11/2011   Procedure: PERCUTANEOUS CORONARY STENT INTERVENTION (PCI-S);  Surgeon: Jettie Booze, MD;  Location: [facility] CATH LAB;  Service: Cardiovascular;  Laterality: N/A;  . SHOULDER SURGERY Right    X 2  . TONSILLECTOMY  ~ 1948  . TOTAL KNEE ARTHROPLASTY Left 07/23/2016   Procedure: LEFT TOTAL KNEE ARTHROPLASTY;  Surgeon: Marybelle Killings, MD;  Location: [place];  Service: Orthopedics;  Laterality: Left;   Social History   Occupational History  . Retired    Social History Main Topics  . Smoking status: Former Research scientist (life sciences)  . Smokeless tobacco: Current User    Types: Chew  . Alcohol use No  . Drug  use: No  . Sexual activity: Not on file

## 2016-08-31 ENCOUNTER — Ambulatory Visit: Payer: Medicare Other | Admitting: *Deleted

## 2016-08-31 DIAGNOSIS — R600 Localized edema: Secondary | ICD-10-CM

## 2016-08-31 DIAGNOSIS — M25662 Stiffness of left knee, not elsewhere classified: Secondary | ICD-10-CM | POA: Diagnosis not present

## 2016-08-31 DIAGNOSIS — M25562 Pain in left knee: Secondary | ICD-10-CM | POA: Diagnosis not present

## 2016-08-31 NOTE — Therapy (Signed)
[facility] [place], [place], [address] Phone: [phone]   Fax:  [phone]  Physical Therapy Treatment  Patient Details  Name: Keith Espinoza MRN: [id number] Date of Birth: 25-Jul-1941 Referring Provider: [name].  Encounter Date: 08/31/2016      PT End of Session - 08/31/16 0812    Visit Number 4   Number of Visits 16   Date for PT Re-Evaluation 10/16/16   PT Start Time 0815   PT Stop Time 0916   PT Time Calculation (min) 61 min      Past Medical History:  Diagnosis Date  . Allergic rhinitis   . Allergic rhinitis   . Arthritis   . Chronic leg pain    right  . Chronic lower back pain   . Coronary artery disease    a. Stenting to RCA 2004; staged DES to LAD and Cx 2004. DES to mRCA 2012. b. DES to mCx, PTCA to dCx 11/2011. c. Lateral wall MI 2013 s/p PTCA to distal Cx & DES to mid OM2 11/2011. d. Low risk nuc 04/2014, EF wnl.  . Diabetes mellitus    Insulin dependent  . Diabetic neuropathy (HCC)    MILD  . Diverticulosis   . Dysrhythmia   . Gilbert syndrome   . Gout    right wrist; right foot; right elbow; have had it since 1970's  . H/O hiatal hernia   . Heart murmur   . History of echocardiogram    aortic sclerosis per echo 12/09 EF 65%, otherwise normal  . History of hemorrhoids    BLEEDING  . Hypertension    Diagnosed 1995   . Pancreatic pseudocyst    a. s/p remote drainage 2006.  Marland Kitchen Thrombocytopenia ([place])    Seen on oldest labs in system from 2004  . Vitamin B 12 deficiency    orally replaced    Past Surgical History:  Procedure Laterality Date  . BACK SURGERY     "total of 3 times" S/P fall   . CARPAL TUNNEL RELEASE Bilateral   . CHOLECYSTECTOMY  1990's  . COLONOSCOPY    . CORONARY ANGIOPLASTY  11/11/11  . CORONARY ANGIOPLASTY WITH STENT PLACEMENT  09/30/2011   "1 then; makes a total of 4"  . CORONARY ANGIOPLASTY WITH STENT PLACEMENT  11/11/11   "1; makes a total of 5"  . INGUINAL  HERNIA REPAIR  2003   right  . JOINT REPLACEMENT Right 04/03/2002   hip replacment  . KNEE ARTHROSCOPY  1990's   left  . LEFT HEART CATHETERIZATION WITH CORONARY ANGIOGRAM N/A 09/30/2011   Procedure: LEFT HEART CATHETERIZATION WITH CORONARY ANGIOGRAM;  Surgeon: Jettie Booze, MD;  Location: [facility] CATH LAB;  Service: Cardiovascular;  Laterality: N/A;  possible PCI  . LEFT HEART CATHETERIZATION WITH CORONARY ANGIOGRAM N/A 11/15/2011   Procedure: LEFT HEART CATHETERIZATION WITH CORONARY ANGIOGRAM;  Surgeon: Jettie Booze, MD;  Location: [facility] CATH LAB;  Service: Cardiovascular;  Laterality: N/A;  . PERCUTANEOUS CORONARY STENT INTERVENTION (PCI-S)  09/30/2011   Procedure: PERCUTANEOUS CORONARY STENT INTERVENTION (PCI-S);  Surgeon: Jettie Booze, MD;  Location: [facility] CATH LAB;  Service: Cardiovascular;;  . PERCUTANEOUS CORONARY STENT INTERVENTION (PCI-S) N/A 11/11/2011   Procedure: PERCUTANEOUS CORONARY STENT INTERVENTION (PCI-S);  Surgeon: Jettie Booze, MD;  Location: [facility] CATH LAB;  Service: Cardiovascular;  Laterality: N/A;  . SHOULDER SURGERY Right    X 2  . TONSILLECTOMY  ~ 1948  . TOTAL KNEE ARTHROPLASTY Left  07/23/2016   Procedure: LEFT TOTAL KNEE ARTHROPLASTY;  Surgeon: Marybelle Killings, MD;  Location: [place];  Service: Orthopedics;  Laterality: Left;    There were no vitals filed for this visit.      Subjective Assessment - 08/31/16 0811    Subjective Reports that he has some L calf tightness and reported sharp sensation in L knee with nustep seat adjustment.   Patient Stated Goals Get back to normal.   Currently in Pain? Yes   Pain Score 3    Pain Location Knee   Pain Orientation Left   Pain Type Surgical pain                         [facility] Adult PT Treatment/Exercise - 08/31/16 0001      Exercises   Exercises Knee/Hip     Knee/Hip Exercises: Aerobic   Nustep L6, seat 13, 12 x20 min     Knee/Hip Exercises: Standing   Forward Lunges Left;10 reps;3  sets;3 seconds  12  inch box   Forward Step Up Left;3 sets;10 reps;Step Height: 6"   Rocker Board --  Pt requested not to perform due to pain     Knee/Hip Exercises: Seated   Long Arc Quad Strengthening;Left;3 sets;10 reps   Long Arc Quad Weight 2 lbs.     Modalities   Modalities Vasopneumatic     Electrical Stimulation   Electrical Stimulation Location LT knee IFC 1-10hz  x 15 mins   Electrical Stimulation Action space electrodes out. No feeling near incision   Electrical Stimulation Goals Edema;Pain     Vasopneumatic   Number Minutes Vasopneumatic  15 minutes   Vasopnuematic Location  Knee   Vasopneumatic Pressure Medium   Vasopneumatic Temperature  36     Manual Therapy   Manual Therapy Passive ROM;Soft tissue mobilization   Soft tissue mobilization L patellar mobilizations to promote proper mobility   Passive ROM PROM of L knee into flex/ext with gentle holds at end range                  PT Short Term Goals - 08/19/16 0910      PT SHORT TERM GOAL #1   Title Independent with an initial HEP.   Time 4   Period Weeks   Status Achieved     PT SHORT TERM GOAL #2   Title Active left knee extension= -5 degrees to help normalize gait pattern.   Time 4   Period Weeks   Status On-going  AROM -15 degrees 08/19/16           PT Long Term Goals - 08/17/16 1326      PT LONG TERM GOAL #1   Title Independent with an advanced HEP.   Time 8   Period Weeks   Status New     PT LONG TERM GOAL #2   Title Active left knee flexion to 115 degrees+ so the patient can perform functional tasks and do so with pain not > 2-3/10.   Time 8   Period Weeks   Status New     PT LONG TERM GOAL #3   Title Perform a reciprocating stair gait with one railing with pain not > 2-3/10.   Time 8   Period Weeks   Status New               Plan - 08/31/16 [id number]    Clinical Impression Statement Pt arrived today without FWW  or any AD. He went to MD and the Dr told him he  could DC the walker at this time. Pt did better today with tolerance of Rx and wasn't as apprehensive toward stretching. He did ask not to perform Rockerboard exs due to LT calf and achilles soreness after the last time..    Rehab Potential Excellent   PT Frequency 3x / week   PT Duration 8 weeks   PT Treatment/Interventions ADLs/Self Care Home Management;Cryotherapy;Electrical Stimulation;Therapeutic activities;Therapeutic exercise;Patient/family education;Passive range of motion;Manual techniques;Vasopneumatic Device   PT Next Visit Plan Continue per L TKR protcol with modalities PRN per MPT POC.   Consulted and Agree with Plan of Care Patient      Patient will benefit from skilled therapeutic intervention in order to improve the following deficits and impairments:  Pain, Decreased activity tolerance, Decreased range of motion, Decreased strength, Increased edema  Visit Diagnosis: Acute pain of left knee  Stiffness of left knee, not elsewhere classified  Localized edema     Problem List Patient Active Problem List   Diagnosis Date Noted  . Unilateral primary osteoarthritis, left knee 07/23/2016  . Hyperlipidemia 09/04/2014  . Thrombocytopenia ([place])   . Precordial chest pain 04/05/2014  . Coronary atherosclerosis of native coronary artery 10/01/2013  . Other and unspecified hyperlipidemia 10/01/2013  . Essential hypertension, benign 10/01/2013  . Insulin dependent type 2 diabetes mellitus ([place]) 10/01/2013  . Esophageal reflux 10/01/2013  . Hypertrophy of prostate without urinary obstruction and other lower urinary tract symptoms (LUTS) 10/01/2013    RAMSEUR,CHRIS, PTA 08/31/2016, 9:22 AM  [facility] [place], [place], [address] Phone: [phone]   Fax:  [phone]  Name: Keith Espinoza MRN: [id number] Date of Birth: [birth year]

## 2016-08-31 NOTE — Telephone Encounter (Signed)
error 

## 2016-09-03 ENCOUNTER — Encounter: Payer: Self-pay | Admitting: Physical Therapy

## 2016-09-03 ENCOUNTER — Ambulatory Visit: Payer: Medicare Other | Attending: Orthopaedic Surgery | Admitting: Physical Therapy

## 2016-09-03 DIAGNOSIS — M25662 Stiffness of left knee, not elsewhere classified: Secondary | ICD-10-CM | POA: Diagnosis not present

## 2016-09-03 DIAGNOSIS — R600 Localized edema: Secondary | ICD-10-CM | POA: Insufficient documentation

## 2016-09-03 DIAGNOSIS — M25562 Pain in left knee: Secondary | ICD-10-CM | POA: Diagnosis not present

## 2016-09-03 NOTE — Therapy (Signed)
[facility] [place], [place], [address] Phone: [phone]   Fax:  [phone]  Physical Therapy Treatment  Patient Details  Name: Keith Espinoza MRN: [id number] Date of Birth: Jul 22, 1941 Referring Provider: [name].  Encounter Date: 09/03/2016      PT End of Session - 09/03/16 0815    Visit Number 5   Number of Visits 16   Date for PT Re-Evaluation 10/16/16   PT Start Time 0806   PT Stop Time 0900   PT Time Calculation (min) 54 min   Activity Tolerance Patient tolerated treatment well   Behavior During Therapy [facility] for tasks assessed/performed      Past Medical History:  Diagnosis Date  . Allergic rhinitis   . Allergic rhinitis   . Arthritis   . Chronic leg pain    right  . Chronic lower back pain   . Coronary artery disease    a. Stenting to RCA 2004; staged DES to LAD and Cx 2004. DES to mRCA 2012. b. DES to mCx, PTCA to dCx 11/2011. c. Lateral wall MI 2013 s/p PTCA to distal Cx & DES to mid OM2 11/2011. d. Low risk nuc 04/2014, EF wnl.  . Diabetes mellitus    Insulin dependent  . Diabetic neuropathy (HCC)    MILD  . Diverticulosis   . Dysrhythmia   . Gilbert syndrome   . Gout    right wrist; right foot; right elbow; have had it since 1970's  . H/O hiatal hernia   . Heart murmur   . History of echocardiogram    aortic sclerosis per echo 12/09 EF 65%, otherwise normal  . History of hemorrhoids    BLEEDING  . Hypertension    Diagnosed 1995   . Pancreatic pseudocyst    a. s/p remote drainage 2006.  Marland Kitchen Thrombocytopenia ([place])    Seen on oldest labs in system from 2004  . Vitamin B 12 deficiency    orally replaced    Past Surgical History:  Procedure Laterality Date  . BACK SURGERY     "total of 3 times" S/P fall   . CARPAL TUNNEL RELEASE Bilateral   . CHOLECYSTECTOMY  1990's  . COLONOSCOPY    . CORONARY ANGIOPLASTY  11/11/11  . CORONARY ANGIOPLASTY WITH STENT PLACEMENT  09/30/2011   "1 then;  makes a total of 4"  . CORONARY ANGIOPLASTY WITH STENT PLACEMENT  11/11/11   "1; makes a total of 5"  . INGUINAL HERNIA REPAIR  2003   right  . JOINT REPLACEMENT Right 04/03/2002   hip replacment  . KNEE ARTHROSCOPY  1990's   left  . LEFT HEART CATHETERIZATION WITH CORONARY ANGIOGRAM N/A 09/30/2011   Procedure: LEFT HEART CATHETERIZATION WITH CORONARY ANGIOGRAM;  Surgeon: Jettie Booze, MD;  Location: [facility] CATH LAB;  Service: Cardiovascular;  Laterality: N/A;  possible PCI  . LEFT HEART CATHETERIZATION WITH CORONARY ANGIOGRAM N/A 11/15/2011   Procedure: LEFT HEART CATHETERIZATION WITH CORONARY ANGIOGRAM;  Surgeon: Jettie Booze, MD;  Location: [facility] CATH LAB;  Service: Cardiovascular;  Laterality: N/A;  . PERCUTANEOUS CORONARY STENT INTERVENTION (PCI-S)  09/30/2011   Procedure: PERCUTANEOUS CORONARY STENT INTERVENTION (PCI-S);  Surgeon: Jettie Booze, MD;  Location: [facility] CATH LAB;  Service: Cardiovascular;;  . PERCUTANEOUS CORONARY STENT INTERVENTION (PCI-S) N/A 11/11/2011   Procedure: PERCUTANEOUS CORONARY STENT INTERVENTION (PCI-S);  Surgeon: Jettie Booze, MD;  Location: [facility] CATH LAB;  Service: Cardiovascular;  Laterality: N/A;  . SHOULDER SURGERY Right  X 2  . TONSILLECTOMY  ~ 1948  . TOTAL KNEE ARTHROPLASTY Left 07/23/2016   Procedure: LEFT TOTAL KNEE ARTHROPLASTY;  Surgeon: Marybelle Killings, MD;  Location: [place];  Service: Orthopedics;  Laterality: Left;    There were no vitals filed for this visit.      Subjective Assessment - 09/03/16 0815    Subjective Reports that L knee is stiff this morning.   Patient Stated Goals Get back to normal.   Currently in Pain? Yes   Pain Score 3    Pain Location Knee   Pain Orientation Left   Pain Descriptors / Indicators Other (Comment)  "stiff"   Pain Type Surgical pain            [facility] PT Assessment - 09/03/16 0001      Assessment   Medical Diagnosis Left total knee replacement.   Onset Date/Surgical Date 07/31/16    Next MD Visit 09/24/2016     Restrictions   Weight Bearing Restrictions No     ROM / Strength   AROM / PROM / Strength AROM     AROM   Overall AROM  Deficits   AROM Assessment Site Knee   Right/Left Knee Left   Left Knee Extension -6   Left Knee Flexion 84                     [facility] Adult PT Treatment/Exercise - 09/03/16 0001      Knee/Hip Exercises: Stretches   Active Hamstring Stretch Left;3 reps;30 seconds     Knee/Hip Exercises: Aerobic   Nustep L6, seat 13-12, x15 min     Knee/Hip Exercises: Standing   Forward Lunges Left;2 sets;10 reps   Hip Abduction Stengthening;Left;2 sets;10 reps;Knee straight   Abduction Limitations 4#   Forward Step Up Left;2 sets;10 reps;Hand Hold: 2;Step Height: 6"     Knee/Hip Exercises: Seated   Long Arc Quad Strengthening;Left;2 sets;10 reps;Weights   Long Arc Quad Weight 4 lbs.     Knee/Hip Exercises: Supine   Straight Leg Raises AROM;Left;2 sets;10 reps     Modalities   Modalities Vasopneumatic     Vasopneumatic   Number Minutes Vasopneumatic  15 minutes   Vasopnuematic Location  Knee   Vasopneumatic Pressure Medium   Vasopneumatic Temperature  45     Manual Therapy   Manual Therapy Passive ROM;Soft tissue mobilization;Myofascial release   Soft tissue mobilization L patella mobilizations in sup/inf, lat/med directions to promote proper mobility   Myofascial Release MFR to L Quad/ITB/ HS to decrease tightness to increase ROM   Passive ROM PROM of L knee into flex/ext with gentle holds at end range                  PT Short Term Goals - 09/03/16 0847      PT SHORT TERM GOAL #1   Title Independent with an initial HEP.   Time 4   Period Weeks   Status Achieved     PT SHORT TERM GOAL #2   Title Active left knee extension= -5 degrees to help normalize gait pattern.   Time 4   Period Weeks   Status Partially Met  AROM L knee ext -6 deg from neutral 09/03/16           PT Long Term Goals -  09/03/16 0848      PT LONG TERM GOAL #1   Title Independent with an advanced HEP.   Time 8  Period Weeks   Status On-going     PT LONG TERM GOAL #2   Title Active left knee flexion to 115 degrees+ so the patient can perform functional tasks and do so with pain not > 2-3/10.   Time 8   Period Weeks   Status On-going  AROM L knee flexion 84 deg 09/03/2016     PT LONG TERM GOAL #3   Title Perform a reciprocating stair gait with one railing with pain not > 2-3/10.   Time 8   Period Weeks   Status On-going               Plan - 09/03/16 0849    Clinical Impression Statement Patient arrived to treatment with only L knee stiffness but no other complaints when seat advanced on NuStep. Patient able to complete exercises as directed with only minimal multimodal cueing for proper exercise technique. HS stretching and forward lunges encouraged today as to promote improved ROM. Tightness palpated in L Quad, ITB, and medial HS to which patient was educated to complete HS stretch at home to decrease tightness. AROM L knee measured as 6-84 deg with good patellar mobility. Normal vasopneumatic response noted following removal of the modalities.     Rehab Potential Excellent   PT Frequency 3x / week   PT Duration 8 weeks   PT Treatment/Interventions ADLs/Self Care Home Management;Cryotherapy;Electrical Stimulation;Therapeutic activities;Therapeutic exercise;Patient/family education;Passive range of motion;Manual techniques;Vasopneumatic Device   PT Next Visit Plan Continue per L TKR protcol with modalities PRN per MPT POC.   Consulted and Agree with Plan of Care Patient      Patient will benefit from skilled therapeutic intervention in order to improve the following deficits and impairments:  Pain, Decreased activity tolerance, Decreased range of motion, Decreased strength, Increased edema  Visit Diagnosis: Acute pain of left knee  Stiffness of left knee, not elsewhere  classified  Localized edema     Problem List Patient Active Problem List   Diagnosis Date Noted  . Unilateral primary osteoarthritis, left knee 07/23/2016  . Hyperlipidemia 09/04/2014  . Thrombocytopenia ([place])   . Precordial chest pain 04/05/2014  . Coronary atherosclerosis of native coronary artery 10/01/2013  . Other and unspecified hyperlipidemia 10/01/2013  . Essential hypertension, benign 10/01/2013  . Insulin dependent type 2 diabetes mellitus ([place]) 10/01/2013  . Esophageal reflux 10/01/2013  . Hypertrophy of prostate without urinary obstruction and other lower urinary tract symptoms (LUTS) 10/01/2013    Wynelle Fanny, PTA 09/03/2016, 9:02 AM  [facility] [place], [place], [address] Phone: [phone]   Fax:  [phone]  Name: Keith Espinoza MRN: [id number] Date of Birth: 06-30-1941

## 2016-09-06 ENCOUNTER — Ambulatory Visit: Payer: Medicare Other | Admitting: Physical Therapy

## 2016-09-06 DIAGNOSIS — R600 Localized edema: Secondary | ICD-10-CM | POA: Diagnosis not present

## 2016-09-06 DIAGNOSIS — M25562 Pain in left knee: Secondary | ICD-10-CM

## 2016-09-06 DIAGNOSIS — M25662 Stiffness of left knee, not elsewhere classified: Secondary | ICD-10-CM

## 2016-09-06 NOTE — Therapy (Signed)
[facility] [place], [place], [address] Phone: [phone]   Fax:  [phone]  Physical Therapy Treatment  Patient Details  Name: Keith Espinoza MRN: [id number] Date of Birth: 27-Sep-1941 Referring Provider: [name].  Encounter Date: 09/06/2016      PT End of Session - 09/06/16 0852    Visit Number 6   Number of Visits 16   Date for PT Re-Evaluation 10/16/16   PT Start Time 0805   PT Stop Time 0902   PT Time Calculation (min) 57 min   Activity Tolerance Patient tolerated treatment well   Behavior During Therapy [facility] for tasks assessed/performed      Past Medical History:  Diagnosis Date  . Allergic rhinitis   . Allergic rhinitis   . Arthritis   . Chronic leg pain    right  . Chronic lower back pain   . Coronary artery disease    a. Stenting to RCA 2004; staged DES to LAD and Cx 2004. DES to mRCA 2012. b. DES to mCx, PTCA to dCx 11/2011. c. Lateral wall MI 2013 s/p PTCA to distal Cx & DES to mid OM2 11/2011. d. Low risk nuc 04/2014, EF wnl.  . Diabetes mellitus    Insulin dependent  . Diabetic neuropathy (HCC)    MILD  . Diverticulosis   . Dysrhythmia   . Gilbert syndrome   . Gout    right wrist; right foot; right elbow; have had it since 1970's  . H/O hiatal hernia   . Heart murmur   . History of echocardiogram    aortic sclerosis per echo 12/09 EF 65%, otherwise normal  . History of hemorrhoids    BLEEDING  . Hypertension    Diagnosed 1995   . Pancreatic pseudocyst    a. s/p remote drainage 2006.  Marland Kitchen Thrombocytopenia ([place])    Seen on oldest labs in system from 2004  . Vitamin B 12 deficiency    orally replaced    Past Surgical History:  Procedure Laterality Date  . BACK SURGERY     "total of 3 times" S/P fall   . CARPAL TUNNEL RELEASE Bilateral   . CHOLECYSTECTOMY  1990's  . COLONOSCOPY    . CORONARY ANGIOPLASTY  11/11/11  . CORONARY ANGIOPLASTY WITH STENT PLACEMENT  09/30/2011   "1 then;  makes a total of 4"  . CORONARY ANGIOPLASTY WITH STENT PLACEMENT  11/11/11   "1; makes a total of 5"  . INGUINAL HERNIA REPAIR  2003   right  . JOINT REPLACEMENT Right 04/03/2002   hip replacment  . KNEE ARTHROSCOPY  1990's   left  . LEFT HEART CATHETERIZATION WITH CORONARY ANGIOGRAM N/A 09/30/2011   Procedure: LEFT HEART CATHETERIZATION WITH CORONARY ANGIOGRAM;  Surgeon: Jettie Booze, MD;  Location: [facility] CATH LAB;  Service: Cardiovascular;  Laterality: N/A;  possible PCI  . LEFT HEART CATHETERIZATION WITH CORONARY ANGIOGRAM N/A 11/15/2011   Procedure: LEFT HEART CATHETERIZATION WITH CORONARY ANGIOGRAM;  Surgeon: Jettie Booze, MD;  Location: [facility] CATH LAB;  Service: Cardiovascular;  Laterality: N/A;  . PERCUTANEOUS CORONARY STENT INTERVENTION (PCI-S)  09/30/2011   Procedure: PERCUTANEOUS CORONARY STENT INTERVENTION (PCI-S);  Surgeon: Jettie Booze, MD;  Location: [facility] CATH LAB;  Service: Cardiovascular;;  . PERCUTANEOUS CORONARY STENT INTERVENTION (PCI-S) N/A 11/11/2011   Procedure: PERCUTANEOUS CORONARY STENT INTERVENTION (PCI-S);  Surgeon: Jettie Booze, MD;  Location: [facility] CATH LAB;  Service: Cardiovascular;  Laterality: N/A;  . SHOULDER SURGERY Right  X 2  . TONSILLECTOMY  ~ 1948  . TOTAL KNEE ARTHROPLASTY Left 07/23/2016   Procedure: LEFT TOTAL KNEE ARTHROPLASTY;  Surgeon: Marybelle Killings, MD;  Location: [place];  Service: Orthopedics;  Laterality: Left;    There were no vitals filed for this visit.      Subjective Assessment - 09/06/16 0818    Subjective Reports knee and foot were swollen this weekend   Patient Stated Goals Get back to normal.   Currently in Pain? Yes   Pain Score 3    Pain Location Knee   Pain Orientation Left   Pain Descriptors / Indicators Numbness  "dead feeling"   Pain Type Surgical pain                         [facility] Adult PT Treatment/Exercise - 09/06/16 0001      Knee/Hip Exercises: Stretches   Active Hamstring Stretch  Left;3 reps;30 seconds     Knee/Hip Exercises: Aerobic   Nustep Level 6 x 15 minutes, seat 12, then 11 for last 4 minutes     Knee/Hip Exercises: Standing   Forward Lunges Left;3 sets  with 5 sec hold to increase ROM   Lateral Step Up Left;2 sets;10 reps;Hand Hold: 1;Step Height: 6"   Forward Step Up Left;2 sets;10 reps;Hand Hold: 1;Step Height: 6"   Step Down Left;2 sets;10 reps;Hand Hold: 1;Step Height: 4"     Knee/Hip Exercises: Supine   Short Arc Quad Sets AROM;Strengthening;Left;3 sets;10 reps     Modalities   Modalities Vasopneumatic     Vasopneumatic   Number Minutes Vasopneumatic  15 minutes   Vasopnuematic Location  Knee   Vasopneumatic Pressure Medium   Vasopneumatic Temperature  35     Manual Therapy   Manual Therapy Joint mobilization;Soft tissue mobilization;Passive ROM   Joint Mobilization Lt patella mobs with improved mobility in all directions   Myofascial Release Lt quad, ITband, hamstring   Passive ROM Lt knee into flexion and extension with holds at end range                PT Education - 09/06/16 0852    Education provided Yes   Education Details importance of ice and elevation to prevent swelling   Person(s) Educated Patient   Methods Explanation   Comprehension Verbalized understanding          PT Short Term Goals - 09/06/16 0854      PT SHORT TERM GOAL #1   Title Independent with an initial HEP.   Status Achieved     PT SHORT TERM GOAL #2   Title Active left knee extension= -5 degrees to help normalize gait pattern.   Status Partially Met           PT Long Term Goals - 09/06/16 0854      PT LONG TERM GOAL #1   Title Independent with an advanced HEP.   Status On-going     PT LONG TERM GOAL #2   Title Active left knee flexion to 115 degrees+ so the patient can perform functional tasks and do so with pain not > 2-3/10.   Status On-going     PT LONG TERM GOAL #3   Title Perform a reciprocating stair gait with one railing  with pain not > 2-3/10.   Status On-going               Plan - 09/06/16 0853    Clinical Impression Statement  Pt continues with decreased ROM in Lt knee, pain at end range holds during PROM. Pt able to add lateral and step downs today. will continue to benefit from treatment focusing on increasing ROM   Rehab Potential Excellent   PT Frequency 3x / week   PT Duration 8 weeks   PT Treatment/Interventions ADLs/Self Care Home Management;Cryotherapy;Electrical Stimulation;Therapeutic activities;Therapeutic exercise;Patient/family education;Passive range of motion;Manual techniques;Vasopneumatic Device   PT Next Visit Plan manual and therex to improve ROM, modalities as needed   Consulted and Agree with Plan of Care Patient      Patient will benefit from skilled therapeutic intervention in order to improve the following deficits and impairments:  Pain, Decreased activity tolerance, Decreased range of motion, Decreased strength, Increased edema  Visit Diagnosis: Acute pain of left knee  Stiffness of left knee, not elsewhere classified  Localized edema     Problem List Patient Active Problem List   Diagnosis Date Noted  . Unilateral primary osteoarthritis, left knee 07/23/2016  . Hyperlipidemia 09/04/2014  . Thrombocytopenia ([place])   . Precordial chest pain 04/05/2014  . Coronary atherosclerosis of native coronary artery 10/01/2013  . Other and unspecified hyperlipidemia 10/01/2013  . Essential hypertension, benign 10/01/2013  . Insulin dependent type 2 diabetes mellitus ([place]) 10/01/2013  . Esophageal reflux 10/01/2013  . Hypertrophy of prostate without urinary obstruction and other lower urinary tract symptoms (LUTS) 10/01/2013   Isabelle Course, PT, DPT 09/06/2016, 8:55 AM  [facility] [address] [place], [place], [address] Phone: [phone]   Fax:  [phone]  Name: NYGEL PROKOP MRN: [id number] Date of Birth:  07-10-1941

## 2016-09-09 ENCOUNTER — Ambulatory Visit: Payer: Medicare Other | Admitting: *Deleted

## 2016-09-09 DIAGNOSIS — M25562 Pain in left knee: Secondary | ICD-10-CM | POA: Diagnosis not present

## 2016-09-09 DIAGNOSIS — R600 Localized edema: Secondary | ICD-10-CM | POA: Diagnosis not present

## 2016-09-09 DIAGNOSIS — M25662 Stiffness of left knee, not elsewhere classified: Secondary | ICD-10-CM

## 2016-09-09 NOTE — Therapy (Signed)
[facility] [employer] [employer], [place], [address] Phone: [phone]   Fax:  [phone]  Physical Therapy Treatment  Patient Details  Name: MATEUS REWERTS MRN: [id number] Date of Birth: [birth year] Referring Provider: [name].  Encounter Date: 09/09/2016      PT End of Session - 09/09/16 0809    Visit Number 7   Number of Visits 16   Date for PT Re-Evaluation 10/16/16   PT Start Time 0815   PT Stop Time 0915   PT Time Calculation (min) 60 min      Past Medical History:  Diagnosis Date  . Allergic rhinitis   . Allergic rhinitis   . Arthritis   . Chronic leg pain    right  . Chronic lower back pain   . Coronary artery disease    a. Stenting to RCA 2004; staged DES to LAD and Cx 2004. DES to mRCA 2012. b. DES to mCx, PTCA to dCx 11/2011. c. Lateral wall MI 2013 s/p PTCA to distal Cx & DES to mid OM2 11/2011. d. Low risk nuc 04/2014, EF wnl.  . Diabetes mellitus    Insulin dependent  . Diabetic neuropathy (HCC)    MILD  . Diverticulosis   . Dysrhythmia   . Gilbert syndrome   . Gout    right wrist; right foot; right elbow; have had it since 1970's  . H/O hiatal hernia   . Heart murmur   . History of echocardiogram    aortic sclerosis per echo 12/09 EF 65%, otherwise normal  . History of hemorrhoids    BLEEDING  . Hypertension    Diagnosed 1995   . Pancreatic pseudocyst    a. s/p remote drainage 2006.  Marland Kitchen Thrombocytopenia ([place])    Seen on oldest labs in system from 2004  . Vitamin B 12 deficiency    orally replaced    Past Surgical History:  Procedure Laterality Date  . BACK SURGERY     "total of 3 times" S/P fall   . CARPAL TUNNEL RELEASE Bilateral   . CHOLECYSTECTOMY  1990's  . COLONOSCOPY    . CORONARY ANGIOPLASTY  11/11/11  . CORONARY ANGIOPLASTY WITH STENT PLACEMENT  09/30/2011   "1 then; makes a total of 4"  . CORONARY ANGIOPLASTY WITH STENT PLACEMENT  11/11/11   "1; makes a total of 5"  . INGUINAL  HERNIA REPAIR  2003   right  . JOINT REPLACEMENT Right 04/03/2002   hip replacment  . KNEE ARTHROSCOPY  1990's   left  . LEFT HEART CATHETERIZATION WITH CORONARY ANGIOGRAM N/A 09/30/2011   Procedure: LEFT HEART CATHETERIZATION WITH CORONARY ANGIOGRAM;  Surgeon: Jettie Booze, MD;  Location: [facility] CATH LAB;  Service: Cardiovascular;  Laterality: N/A;  possible PCI  . LEFT HEART CATHETERIZATION WITH CORONARY ANGIOGRAM N/A 11/15/2011   Procedure: LEFT HEART CATHETERIZATION WITH CORONARY ANGIOGRAM;  Surgeon: Jettie Booze, MD;  Location: [facility] CATH LAB;  Service: Cardiovascular;  Laterality: N/A;  . PERCUTANEOUS CORONARY STENT INTERVENTION (PCI-S)  09/30/2011   Procedure: PERCUTANEOUS CORONARY STENT INTERVENTION (PCI-S);  Surgeon: Jettie Booze, MD;  Location: [facility] CATH LAB;  Service: Cardiovascular;;  . PERCUTANEOUS CORONARY STENT INTERVENTION (PCI-S) N/A 11/11/2011   Procedure: PERCUTANEOUS CORONARY STENT INTERVENTION (PCI-S);  Surgeon: Jettie Booze, MD;  Location: [facility] CATH LAB;  Service: Cardiovascular;  Laterality: N/A;  . SHOULDER SURGERY Right    X 2  . TONSILLECTOMY  ~ 1948  . TOTAL KNEE ARTHROPLASTY Left  07/23/2016   Procedure: LEFT TOTAL KNEE ARTHROPLASTY;  Surgeon: Marybelle Killings, MD;  Location: [place];  Service: Orthopedics;  Laterality: Left;    There were no vitals filed for this visit.      Subjective Assessment - 09/09/16 0833    Subjective Reports knee and foot were swollen this weekend. LT is weak   Limitations Standing;Walking   Patient Stated Goals Get back to normal.   Currently in Pain? Yes   Pain Score 3    Pain Location Knee   Pain Orientation Left                         [facility] Adult PT Treatment/Exercise - 09/09/16 0001      Exercises   Exercises Knee/Hip     Knee/Hip Exercises: Aerobic   Nustep Level 6 x 20  minutes, seat 12, then 11, 10 for last 4 minutes     Knee/Hip Exercises: Standing   Forward Lunges Left;3 sets   Lateral  Step Up Left;2 sets;10 reps;Hand Hold: 1;Step Height: 6"   Forward Step Up Left;2 sets;10 reps;Hand Hold: 1;Step Height: 6"     Knee/Hip Exercises: Seated   Long Arc Quad Strengthening;Left;2 sets;10 reps;Weights   Long Arc Quad Weight 4 lbs.     Modalities   Modalities Vasopneumatic     Vasopneumatic   Number Minutes Vasopneumatic  15 minutes   Vasopnuematic Location  Knee   Vasopneumatic Pressure Medium   Vasopneumatic Temperature  35     Manual Therapy   Manual Therapy Joint mobilization;Soft tissue mobilization;Passive ROM   Joint Mobilization Lt patella mobs with improved mobility in all directions   Passive ROM Lt knee into flexion and extension with holds at end range                  PT Short Term Goals - 09/06/16 0854      PT SHORT TERM GOAL #1   Title Independent with an initial HEP.   Status Achieved     PT SHORT TERM GOAL #2   Title Active left knee extension= -5 degrees to help normalize gait pattern.   Status Partially Met           PT Long Term Goals - 09/06/16 0854      PT LONG TERM GOAL #1   Title Independent with an advanced HEP.   Status On-going     PT LONG TERM GOAL #2   Title Active left knee flexion to 115 degrees+ so the patient can perform functional tasks and do so with pain not > 2-3/10.   Status On-going     PT LONG TERM GOAL #3   Title Perform a reciprocating stair gait with one railing with pain not > 2-3/10.   Status On-going               Plan - 09/09/16 0842    Clinical Impression Statement Pt arrives to clinic in good spirits about LT knee and feels it is progressing' but slow for him. He is progressing with strength in clinic and step ups and stair negotion is getting easier. His ROM  today was flxion to 88 degrees  and  -8 degrees extension. ROM is still the focus   Rehab Potential Excellent   PT Frequency 3x / week   PT Duration 8 weeks   PT Treatment/Interventions ADLs/Self Care Home  Management;Cryotherapy;Electrical Stimulation;Therapeutic activities;Therapeutic exercise;Patient/family education;Passive range of motion;Manual techniques;Vasopneumatic Device  PT Next Visit Plan manual and therex to improve ROM, modalities as needed   Consulted and Agree with Plan of Care Patient      Patient will benefit from skilled therapeutic intervention in order to improve the following deficits and impairments:  Pain, Decreased activity tolerance, Decreased range of motion, Decreased strength, Increased edema  Visit Diagnosis: Acute pain of left knee  Stiffness of left knee, not elsewhere classified  Localized edema     Problem List Patient Active Problem List   Diagnosis Date Noted  . Unilateral primary osteoarthritis, left knee 07/23/2016  . Hyperlipidemia 09/04/2014  . Thrombocytopenia ([place])   . Precordial chest pain 04/05/2014  . Coronary atherosclerosis of native coronary artery 10/01/2013  . Other and unspecified hyperlipidemia 10/01/2013  . Essential hypertension, benign 10/01/2013  . Insulin dependent type 2 diabetes mellitus ([place]) 10/01/2013  . Esophageal reflux 10/01/2013  . Hypertrophy of prostate without urinary obstruction and other lower urinary tract symptoms (LUTS) 10/01/2013    RAMSEUR,CHRIS, PTA 09/09/2016, 9:45 AM  [facility] [place], [place], [address] Phone: [phone]   Fax:  [phone]  Name: TYTAN SANDATE MRN: [id number] Date of Birth: January 13, 1941

## 2016-09-13 ENCOUNTER — Encounter: Payer: Self-pay | Admitting: Physical Therapy

## 2016-09-13 ENCOUNTER — Ambulatory Visit: Payer: Medicare Other | Admitting: Physical Therapy

## 2016-09-13 DIAGNOSIS — M25562 Pain in left knee: Secondary | ICD-10-CM

## 2016-09-13 DIAGNOSIS — M25662 Stiffness of left knee, not elsewhere classified: Secondary | ICD-10-CM | POA: Diagnosis not present

## 2016-09-13 DIAGNOSIS — R600 Localized edema: Secondary | ICD-10-CM

## 2016-09-13 NOTE — Therapy (Signed)
[facility] [place], [place], [address] Phone: [phone]   Fax:  [phone]  Physical Therapy Treatment  Patient Details  Name: Keith Espinoza MRN: [id number] Date of Birth: 11-Oct-1940 Referring Provider: [name].  Encounter Date: 09/13/2016      PT End of Session - 09/13/16 0855    Visit Number 8   Number of Visits 16   Date for PT Re-Evaluation 10/16/16   PT Start Time 0815   PT Stop Time 0912   PT Time Calculation (min) 57 min   Activity Tolerance Patient tolerated treatment well   Behavior During Therapy [facility] for tasks assessed/performed      Past Medical History:  Diagnosis Date  . Allergic rhinitis   . Allergic rhinitis   . Arthritis   . Chronic leg pain    right  . Chronic lower back pain   . Coronary artery disease    a. Stenting to RCA 2004; staged DES to LAD and Cx 2004. DES to mRCA 2012. b. DES to mCx, PTCA to dCx 11/2011. c. Lateral wall MI 2013 s/p PTCA to distal Cx & DES to mid OM2 11/2011. d. Low risk nuc 04/2014, EF wnl.  . Diabetes mellitus    Insulin dependent  . Diabetic neuropathy (HCC)    MILD  . Diverticulosis   . Dysrhythmia   . Gilbert syndrome   . Gout    right wrist; right foot; right elbow; have had it since 1970's  . H/O hiatal hernia   . Heart murmur   . History of echocardiogram    aortic sclerosis per echo 12/09 EF 65%, otherwise normal  . History of hemorrhoids    BLEEDING  . Hypertension    Diagnosed 1995   . Pancreatic pseudocyst    a. s/p remote drainage 2006.  Marland Kitchen Thrombocytopenia ([place])    Seen on oldest labs in system from 2004  . Vitamin B 12 deficiency    orally replaced    Past Surgical History:  Procedure Laterality Date  . BACK SURGERY     "total of 3 times" S/P fall   . CARPAL TUNNEL RELEASE Bilateral   . CHOLECYSTECTOMY  1990's  . COLONOSCOPY    . CORONARY ANGIOPLASTY  11/11/11  . CORONARY ANGIOPLASTY WITH STENT PLACEMENT  09/30/2011   "1 then;  makes a total of 4"  . CORONARY ANGIOPLASTY WITH STENT PLACEMENT  11/11/11   "1; makes a total of 5"  . INGUINAL HERNIA REPAIR  2003   right  . JOINT REPLACEMENT Right 04/03/2002   hip replacment  . KNEE ARTHROSCOPY  1990's   left  . LEFT HEART CATHETERIZATION WITH CORONARY ANGIOGRAM N/A 09/30/2011   Procedure: LEFT HEART CATHETERIZATION WITH CORONARY ANGIOGRAM;  Surgeon: Jettie Booze, MD;  Location: [facility] CATH LAB;  Service: Cardiovascular;  Laterality: N/A;  possible PCI  . LEFT HEART CATHETERIZATION WITH CORONARY ANGIOGRAM N/A 11/15/2011   Procedure: LEFT HEART CATHETERIZATION WITH CORONARY ANGIOGRAM;  Surgeon: Jettie Booze, MD;  Location: [facility] CATH LAB;  Service: Cardiovascular;  Laterality: N/A;  . PERCUTANEOUS CORONARY STENT INTERVENTION (PCI-S)  09/30/2011   Procedure: PERCUTANEOUS CORONARY STENT INTERVENTION (PCI-S);  Surgeon: Jettie Booze, MD;  Location: [facility] CATH LAB;  Service: Cardiovascular;;  . PERCUTANEOUS CORONARY STENT INTERVENTION (PCI-S) N/A 11/11/2011   Procedure: PERCUTANEOUS CORONARY STENT INTERVENTION (PCI-S);  Surgeon: Jettie Booze, MD;  Location: [facility] CATH LAB;  Service: Cardiovascular;  Laterality: N/A;  . SHOULDER SURGERY Right  X 2  . TONSILLECTOMY  ~ 1948  . TOTAL KNEE ARTHROPLASTY Left 07/23/2016   Procedure: LEFT TOTAL KNEE ARTHROPLASTY;  Surgeon: Marybelle Killings, MD;  Location: [place];  Service: Orthopedics;  Laterality: Left;    There were no vitals filed for this visit.      Subjective Assessment - 09/13/16 0817    Subjective Patient reported stiffness in knee today   Limitations Standing;Walking   Patient Stated Goals Get back to normal.   Currently in Pain? Yes   Pain Score 2    Pain Location Knee   Pain Orientation Left   Pain Descriptors / Indicators --  stiff   Pain Type Surgical pain   Pain Onset More than a month ago   Pain Frequency Intermittent   Aggravating Factors  ROM   Pain Relieving Factors at rest             [facility] PT Assessment - 09/13/16 0001      AROM   AROM Assessment Site Knee   Right/Left Knee Left   Left Knee Extension -10   Left Knee Flexion 90     PROM   PROM Assessment Site Knee   Right/Left Knee Left   Left Knee Extension -5   Left Knee Flexion 100                     [facility] Adult PT Treatment/Exercise - 09/13/16 0001      Knee/Hip Exercises: Aerobic   Nustep Level 6 x 20  minutes adjusted seat for ROM     Knee/Hip Exercises: Standing   Lateral Step Up Left;10 reps;Hand Hold: 1;Step Height: 6";3 sets   Forward Step Up Left;10 reps;Hand Hold: 1;Step Height: 6";3 sets     Vasopneumatic   Number Minutes Vasopneumatic  15 minutes   Vasopnuematic Location  Knee   Vasopneumatic Pressure Medium     Manual Therapy   Manual Therapy Passive ROM   Passive ROM Lt knee into flexion and extension with holds at end range                  PT Short Term Goals - 09/13/16 3716      PT SHORT TERM GOAL #1   Title Independent with an initial HEP.   Time 4   Period Weeks   Status Achieved     PT SHORT TERM GOAL #2   Title Active left knee extension= -5 degrees to help normalize gait pattern.   Time 4   Period Weeks   Status Not Met  AROM -10 degrees 09/13/16           PT Long Term Goals - 09/13/16 0858      PT LONG TERM GOAL #1   Title Independent with an advanced HEP.   Time 8   Period Weeks   Status On-going     PT LONG TERM GOAL #2   Title Active left knee flexion to 115 degrees+ so the patient can perform functional tasks and do so with pain not > 2-3/10.   Time 8   Period Weeks   Status On-going  AROM 90 degrees 09/13/16     PT LONG TERM GOAL #3   Title Perform a reciprocating stair gait with one railing with pain not > 2-3/10.   Time 8   Period Weeks   Status On-going               Plan - 09/13/16 9678  Clinical Impression Statement Patient slowly progresing due to ongoing stiffness in left knee. Patient reports doing  self stretching daily several times. Educated patient on correct self stretching technique with longer holds to increase ROM and improve functional independence.  Current goals ongoing due to ROM deficts.   Rehab Potential Excellent   PT Frequency 3x / week   PT Duration 8 weeks   PT Treatment/Interventions ADLs/Self Care Home Management;Cryotherapy;Electrical Stimulation;Therapeutic activities;Therapeutic exercise;Patient/family education;Passive range of motion;Manual techniques;Vasopneumatic Device   PT Next Visit Plan cont with POC for manual and therex to improve ROM, modalities as needed   Consulted and Agree with Plan of Care Patient      Patient will benefit from skilled therapeutic intervention in order to improve the following deficits and impairments:  Pain, Decreased activity tolerance, Decreased range of motion, Decreased strength, Increased edema  Visit Diagnosis: Acute pain of left knee  Stiffness of left knee, not elsewhere classified  Localized edema     Problem List Patient Active Problem List   Diagnosis Date Noted  . Unilateral primary osteoarthritis, left knee 07/23/2016  . Hyperlipidemia 09/04/2014  . Thrombocytopenia ([place])   . Precordial chest pain 04/05/2014  . Coronary atherosclerosis of native coronary artery 10/01/2013  . Other and unspecified hyperlipidemia 10/01/2013  . Essential hypertension, benign 10/01/2013  . Insulin dependent type 2 diabetes mellitus ([place]) 10/01/2013  . Esophageal reflux 10/01/2013  . Hypertrophy of prostate without urinary obstruction and other lower urinary tract symptoms (LUTS) 10/01/2013    DUNFORD, CHRISTINA P, PTA 09/13/2016, 9:15 AM  [facility] [place], [place], [address] Phone: [phone]   Fax:  [phone]  Name: LAMARCO GUDIEL MRN: [id number] Date of Birth: 1941/05/19

## 2016-09-16 ENCOUNTER — Ambulatory Visit: Payer: Medicare Other | Admitting: Physical Therapy

## 2016-09-16 DIAGNOSIS — M25562 Pain in left knee: Secondary | ICD-10-CM | POA: Diagnosis not present

## 2016-09-16 DIAGNOSIS — R600 Localized edema: Secondary | ICD-10-CM

## 2016-09-16 DIAGNOSIS — M25662 Stiffness of left knee, not elsewhere classified: Secondary | ICD-10-CM

## 2016-09-16 NOTE — Therapy (Signed)
[facility] [place], [place], [address] Phone: [phone]   Fax:  [phone]  Physical Therapy Treatment  Patient Details  Name: Keith Espinoza MRN: [id number] Date of Birth: 05/24/1941 Referring Provider: [name].  Encounter Date: 09/16/2016      PT End of Session - 09/16/16 1230    Visit Number 9   Number of Visits 16   Date for PT Re-Evaluation 10/16/16   PT Start Time 0900   PT Stop Time 0953   PT Time Calculation (min) 53 min      Past Medical History:  Diagnosis Date  . Allergic rhinitis   . Allergic rhinitis   . Arthritis   . Chronic leg pain    right  . Chronic lower back pain   . Coronary artery disease    a. Stenting to RCA 2004; staged DES to LAD and Cx 2004. DES to mRCA 2012. b. DES to mCx, PTCA to dCx 11/2011. c. Lateral wall MI 2013 s/p PTCA to distal Cx & DES to mid OM2 11/2011. d. Low risk nuc 04/2014, EF wnl.  . Diabetes mellitus    Insulin dependent  . Diabetic neuropathy (HCC)    MILD  . Diverticulosis   . Dysrhythmia   . Gilbert syndrome   . Gout    right wrist; right foot; right elbow; have had it since 1970's  . H/O hiatal hernia   . Heart murmur   . History of echocardiogram    aortic sclerosis per echo 12/09 EF 65%, otherwise normal  . History of hemorrhoids    BLEEDING  . Hypertension    Diagnosed 1995   . Pancreatic pseudocyst    a. s/p remote drainage 2006.  Marland Kitchen Thrombocytopenia ([place])    Seen on oldest labs in system from 2004  . Vitamin B 12 deficiency    orally replaced    Past Surgical History:  Procedure Laterality Date  . BACK SURGERY     "total of 3 times" S/P fall   . CARPAL TUNNEL RELEASE Bilateral   . CHOLECYSTECTOMY  1990's  . COLONOSCOPY    . CORONARY ANGIOPLASTY  11/11/11  . CORONARY ANGIOPLASTY WITH STENT PLACEMENT  09/30/2011   "1 then; makes a total of 4"  . CORONARY ANGIOPLASTY WITH STENT PLACEMENT  11/11/11   "1; makes a total of 5"  . INGUINAL  HERNIA REPAIR  2003   right  . JOINT REPLACEMENT Right 04/03/2002   hip replacment  . KNEE ARTHROSCOPY  1990's   left  . LEFT HEART CATHETERIZATION WITH CORONARY ANGIOGRAM N/A 09/30/2011   Procedure: LEFT HEART CATHETERIZATION WITH CORONARY ANGIOGRAM;  Surgeon: Jettie Booze, MD;  Location: [facility] CATH LAB;  Service: Cardiovascular;  Laterality: N/A;  possible PCI  . LEFT HEART CATHETERIZATION WITH CORONARY ANGIOGRAM N/A 11/15/2011   Procedure: LEFT HEART CATHETERIZATION WITH CORONARY ANGIOGRAM;  Surgeon: Jettie Booze, MD;  Location: [facility] CATH LAB;  Service: Cardiovascular;  Laterality: N/A;  . PERCUTANEOUS CORONARY STENT INTERVENTION (PCI-S)  09/30/2011   Procedure: PERCUTANEOUS CORONARY STENT INTERVENTION (PCI-S);  Surgeon: Jettie Booze, MD;  Location: [facility] CATH LAB;  Service: Cardiovascular;;  . PERCUTANEOUS CORONARY STENT INTERVENTION (PCI-S) N/A 11/11/2011   Procedure: PERCUTANEOUS CORONARY STENT INTERVENTION (PCI-S);  Surgeon: Jettie Booze, MD;  Location: [facility] CATH LAB;  Service: Cardiovascular;  Laterality: N/A;  . SHOULDER SURGERY Right    X 2  . TONSILLECTOMY  ~ 1948  . TOTAL KNEE ARTHROPLASTY Left  07/23/2016   Procedure: LEFT TOTAL KNEE ARTHROPLASTY;  Surgeon: Marybelle Killings, MD;  Location: [place];  Service: Orthopedics;  Laterality: Left;    There were no vitals filed for this visit.      Subjective Assessment - 09/16/16 1247    Subjective I want to get my knee straighter.   Pain Score 2    Pain Location Knee   Pain Orientation Left   Pain Descriptors / Indicators --  Stiff.   Pain Type Surgical pain   Pain Onset More than a month ago     Treatment:  Nustep level 5 x 20 minutes moving forward x 3 to increase left knee flexion f/b PROM and IASTM to left quadriceps which feel stiff and tight to patient x 10 minutes f/b IFC and medium vasopneumatic x 15 minutes.  Excellent job today.                              PT Short Term Goals -  09/13/16 0856      PT SHORT TERM GOAL #1   Title Independent with an initial HEP.   Time 4   Period Weeks   Status Achieved     PT SHORT TERM GOAL #2   Title Active left knee extension= -5 degrees to help normalize gait pattern.   Time 4   Period Weeks   Status Not Met  AROM -10 degrees 09/13/16           PT Long Term Goals - 09/13/16 0858      PT LONG TERM GOAL #1   Title Independent with an advanced HEP.   Time 8   Period Weeks   Status On-going     PT LONG TERM GOAL #2   Title Active left knee flexion to 115 degrees+ so the patient can perform functional tasks and do so with pain not > 2-3/10.   Time 8   Period Weeks   Status On-going  AROM 90 degrees 09/13/16     PT LONG TERM GOAL #3   Title Perform a reciprocating stair gait with one railing with pain not > 2-3/10.   Time 8   Period Weeks   Status On-going             Patient will benefit from skilled therapeutic intervention in order to improve the following deficits and impairments:  Pain, Decreased activity tolerance, Decreased range of motion, Decreased strength, Increased edema  Visit Diagnosis: Acute pain of left knee  Stiffness of left knee, not elsewhere classified  Localized edema     Problem List Patient Active Problem List   Diagnosis Date Noted  . Unilateral primary osteoarthritis, left knee 07/23/2016  . Hyperlipidemia 09/04/2014  . Thrombocytopenia ([place])   . Precordial chest pain 04/05/2014  . Coronary atherosclerosis of native coronary artery 10/01/2013  . Other and unspecified hyperlipidemia 10/01/2013  . Essential hypertension, benign 10/01/2013  . Insulin dependent type 2 diabetes mellitus ([place]) 10/01/2013  . Esophageal reflux 10/01/2013  . Hypertrophy of prostate without urinary obstruction and other lower urinary tract symptoms (LUTS) 10/01/2013    APPLEGATE, [place] MPT 09/16/2016, 12:52 PM  [facility] [address] [place], [place], [address] Phone: [phone]   Fax:  [phone]  Name: Keith Espinoza MRN: [id number] Date of Birth: [birth year]

## 2016-09-20 ENCOUNTER — Ambulatory Visit: Payer: Medicare Other | Admitting: Physical Therapy

## 2016-09-20 DIAGNOSIS — M25662 Stiffness of left knee, not elsewhere classified: Secondary | ICD-10-CM

## 2016-09-20 DIAGNOSIS — M25562 Pain in left knee: Secondary | ICD-10-CM | POA: Diagnosis not present

## 2016-09-20 DIAGNOSIS — R600 Localized edema: Secondary | ICD-10-CM

## 2016-09-20 NOTE — Therapy (Signed)
[facility] [place], [place], [address] Phone: [phone]   Fax:  [phone]  Physical Therapy Treatment  Patient Details  Name: Keith Espinoza MRN: [id number] Date of Birth: [birth year] Referring Provider: [name].  Encounter Date: 09/20/2016      PT End of Session - 09/20/16 0858    Visit Number 10   Number of Visits 16   Date for PT Re-Evaluation 10/16/16   PT Start Time 0815   PT Stop Time 0910   PT Time Calculation (min) 55 min   Activity Tolerance Patient tolerated treatment well   Behavior During Therapy [facility] for tasks assessed/performed      Past Medical History:  Diagnosis Date  . Allergic rhinitis   . Allergic rhinitis   . Arthritis   . Chronic leg pain    right  . Chronic lower back pain   . Coronary artery disease    a. Stenting to RCA 2004; staged DES to LAD and Cx 2004. DES to mRCA 2012. b. DES to mCx, PTCA to dCx 11/2011. c. Lateral wall MI 2013 s/p PTCA to distal Cx & DES to mid OM2 11/2011. d. Low risk nuc 04/2014, EF wnl.  . Diabetes mellitus    Insulin dependent  . Diabetic neuropathy (HCC)    MILD  . Diverticulosis   . Dysrhythmia   . Gilbert syndrome   . Gout    right wrist; right foot; right elbow; have had it since 1970's  . H/O hiatal hernia   . Heart murmur   . History of echocardiogram    aortic sclerosis per echo 12/09 EF 65%, otherwise normal  . History of hemorrhoids    BLEEDING  . Hypertension    Diagnosed 1995   . Pancreatic pseudocyst    a. s/p remote drainage 2006.  Marland Kitchen Thrombocytopenia ([place])    Seen on oldest labs in system from 2004  . Vitamin B 12 deficiency    orally replaced    Past Surgical History:  Procedure Laterality Date  . BACK SURGERY     "total of 3 times" S/P fall   . CARPAL TUNNEL RELEASE Bilateral   . CHOLECYSTECTOMY  1990's  . COLONOSCOPY    . CORONARY ANGIOPLASTY  11/11/11  . CORONARY ANGIOPLASTY WITH STENT PLACEMENT  09/30/2011   "1 then;  makes a total of 4"  . CORONARY ANGIOPLASTY WITH STENT PLACEMENT  11/11/11   "1; makes a total of 5"  . INGUINAL HERNIA REPAIR  2003   right  . JOINT REPLACEMENT Right 04/03/2002   hip replacment  . KNEE ARTHROSCOPY  1990's   left  . LEFT HEART CATHETERIZATION WITH CORONARY ANGIOGRAM N/A 09/30/2011   Procedure: LEFT HEART CATHETERIZATION WITH CORONARY ANGIOGRAM;  Surgeon: Jettie Booze, MD;  Location: [facility] CATH LAB;  Service: Cardiovascular;  Laterality: N/A;  possible PCI  . LEFT HEART CATHETERIZATION WITH CORONARY ANGIOGRAM N/A 11/15/2011   Procedure: LEFT HEART CATHETERIZATION WITH CORONARY ANGIOGRAM;  Surgeon: Jettie Booze, MD;  Location: [facility] CATH LAB;  Service: Cardiovascular;  Laterality: N/A;  . PERCUTANEOUS CORONARY STENT INTERVENTION (PCI-S)  09/30/2011   Procedure: PERCUTANEOUS CORONARY STENT INTERVENTION (PCI-S);  Surgeon: Jettie Booze, MD;  Location: [facility] CATH LAB;  Service: Cardiovascular;;  . PERCUTANEOUS CORONARY STENT INTERVENTION (PCI-S) N/A 11/11/2011   Procedure: PERCUTANEOUS CORONARY STENT INTERVENTION (PCI-S);  Surgeon: Jettie Booze, MD;  Location: [facility] CATH LAB;  Service: Cardiovascular;  Laterality: N/A;  . SHOULDER SURGERY Right  X 2  . TONSILLECTOMY  ~ 1948  . TOTAL KNEE ARTHROPLASTY Left 07/23/2016   Procedure: LEFT TOTAL KNEE ARTHROPLASTY;  Surgeon: Marybelle Killings, MD;  Location: [place];  Service: Orthopedics;  Laterality: Left;    There were no vitals filed for this visit.      Subjective Assessment - 09/20/16 0818    Subjective My knee doesn't hurt, it just feels tight. I wish we could get it straight   Patient Stated Goals Get back to normal.   Currently in Pain? No/denies            [facility] PT Assessment - 09/20/16 0001      AROM   AROM Assessment Site Knee   Right/Left Knee Left   Left Knee Extension -10   Left Knee Flexion 90                     [facility] Adult PT Treatment/Exercise - 09/20/16 0001      Knee/Hip  Exercises: Stretches   Active Hamstring Stretch Left;2 reps;30 seconds   Gastroc Stretch Left;2 reps;30 seconds     Knee/Hip Exercises: Aerobic   Nustep Level 6 x 15 minutes     Knee/Hip Exercises: Standing   Lateral Step Up Left;2 sets;10 reps;Hand Hold: 1;Step Height: 4"   Forward Step Up Left;2 sets;10 reps;Hand Hold: 1;Step Height: 4"   Step Down Left;2 sets;10 reps;Hand Hold: 1;Step Height: 4"     Knee/Hip Exercises: Supine   Quad Sets AROM;Left;2 sets;10 reps     Vasopneumatic   Number Minutes Vasopneumatic  15 minutes   Vasopnuematic Location  Knee   Vasopneumatic Pressure Medium     Manual Therapy   Manual Therapy Joint mobilization;Soft tissue mobilization   Joint Mobilization into extension and flexion with grade II   Myofascial Release Lt hamstring                  PT Short Term Goals - 09/20/16 0900      PT SHORT TERM GOAL #1   Title Independent with an initial HEP.   Status Achieved     PT SHORT TERM GOAL #2   Title Active left knee extension= -5 degrees to help normalize gait pattern.   Status On-going           PT Long Term Goals - 09/20/16 0900      PT LONG TERM GOAL #1   Title Independent with an advanced HEP.   Status On-going     PT LONG TERM GOAL #2   Title Active left knee flexion to 115 degrees+ so the patient can perform functional tasks and do so with pain not > 2-3/10.   Status On-going     PT LONG TERM GOAL #3   Title Perform a reciprocating stair gait with one railing with pain not > 2-3/10.   Status On-going               Plan - 09/20/16 [id number]    Clinical Impression Statement Pt making slow prorgress, increasing strength but limited gains in ROM. Pt reports contininuing stretching HEP. Pt will continue to benefit from skilled PT to improve ROM for functional mobility   PT Frequency 3x / week   PT Duration 8 weeks   PT Treatment/Interventions ADLs/Self Care Home Management;Cryotherapy;Electrical  Stimulation;Therapeutic activities;Therapeutic exercise;Patient/family education;Passive range of motion;Manual techniques;Vasopneumatic Device   PT Next Visit Plan cont with POC for manual and therex to improve ROM, modalities as needed  Consulted and Agree with Plan of Care Patient      Patient will benefit from skilled therapeutic intervention in order to improve the following deficits and impairments:  Pain, Decreased activity tolerance, Decreased range of motion, Decreased strength, Increased edema  Visit Diagnosis: Acute pain of left knee  Stiffness of left knee, not elsewhere classified  Localized edema     Problem List Patient Active Problem List   Diagnosis Date Noted  . Unilateral primary osteoarthritis, left knee 07/23/2016  . Hyperlipidemia 09/04/2014  . Thrombocytopenia ([place])   . Precordial chest pain 04/05/2014  . Coronary atherosclerosis of native coronary artery 10/01/2013  . Other and unspecified hyperlipidemia 10/01/2013  . Essential hypertension, benign 10/01/2013  . Insulin dependent type 2 diabetes mellitus ([place]) 10/01/2013  . Esophageal reflux 10/01/2013  . Hypertrophy of prostate without urinary obstruction and other lower urinary tract symptoms (LUTS) 10/01/2013    Isabelle Course, PT, DPT 09/20/2016, 9:01 AM  [facility] [address] [place], [place], [address] Phone: [phone]   Fax:  [phone]  Name: Keith Espinoza MRN: [id number] Date of Birth: Oct 14, 1940

## 2016-09-22 ENCOUNTER — Ambulatory Visit (INDEPENDENT_AMBULATORY_CARE_PROVIDER_SITE_OTHER): Payer: Medicare Other | Admitting: Orthopaedic Surgery

## 2016-09-22 ENCOUNTER — Encounter (INDEPENDENT_AMBULATORY_CARE_PROVIDER_SITE_OTHER): Payer: Self-pay | Admitting: Orthopaedic Surgery

## 2016-09-22 VITALS — BP 118/65 | HR 85 | Ht 70.0 in | Wt 190.0 lb

## 2016-09-22 DIAGNOSIS — Z96652 Presence of left artificial knee joint: Secondary | ICD-10-CM

## 2016-09-22 NOTE — Progress Notes (Signed)
Office Visit Note   Patient: Keith Espinoza           Date of Birth: Feb 04, 1941           MRN: [id number] Visit Date: 09/22/2016              Requested by: [name], MD [address] [place], [place] [address] PCP: Henrine Screws, MD   Assessment & Plan: Visit Diagnoses:  1. History of arthroplasty of left knee     Plan: Patient  At 4-95 degrees range of motion. He needs to work hard in the prone position to get his full extension so he can lock his knee when he standing. He states when he stands for. Time he gets tired. He ambulates well in the office today. Quad strength is significantly improved he is off his pain medication. I'll recheck him in a month  Follow-Up Instructions: No Follow-up on file.   Orders:  No orders of the defined types were placed in this encounter.  No orders of the defined types were placed in this encounter.     Procedures: No procedures performed   Clinical Data: No additional findings.   Subjective: Chief Complaint  Patient presents with  . Left Knee - Routine Post Op    Patient returns for four week follow up left knee. He is doing well. He has discontinued all pain meds. He is getting along much better. He states that he has pain with standing but walks fine. He is continuing physical therapy which is helping a lot.     Review of Systems 14 point review systems updated unchanged.   Objective: Vital Signs: BP 118/65   Pulse 85   Ht 5\' 10"  (1.778 m)   Wt 190 lb (86.2 kg)   BMI 27.26 kg/m   Physical Exam knee incisions well healed no effusion clot ligaments are stable his range of motion is from 4 to 95.  Ortho Exam patient lacks full extension of his knee its bouncing week and stretches posterior capsule with prone positioning. Return one month.  Specialty Comments:  No specialty comments available.  Imaging: No results found.   PMFS History: Patient Active Problem List   Diagnosis Date Noted    . Unilateral primary osteoarthritis, left knee 07/23/2016  . Hyperlipidemia 09/04/2014  . Thrombocytopenia ([place])   . Precordial chest pain 04/05/2014  . Coronary atherosclerosis of native coronary artery 10/01/2013  . Other and unspecified hyperlipidemia 10/01/2013  . Essential hypertension, benign 10/01/2013  . Insulin dependent type 2 diabetes mellitus ([place]) 10/01/2013  . Esophageal reflux 10/01/2013  . Hypertrophy of prostate without urinary obstruction and other lower urinary tract symptoms (LUTS) 10/01/2013   Past Medical History:  Diagnosis Date  . Allergic rhinitis   . Allergic rhinitis   . Arthritis   . Chronic leg pain    right  . Chronic lower back pain   . Coronary artery disease    a. Stenting to RCA 2004; staged DES to LAD and Cx 2004. DES to mRCA 2012. b. DES to mCx, PTCA to dCx 11/2011. c. Lateral wall MI 2013 s/p PTCA to distal Cx & DES to mid OM2 11/2011. d. Low risk nuc 04/2014, EF wnl.  . Diabetes mellitus    Insulin dependent  . Diabetic neuropathy (HCC)    MILD  . Diverticulosis   . Dysrhythmia   . Gilbert syndrome   . Gout    right wrist; right foot; right elbow; have  had it since 1970's  . H/O hiatal hernia   . Heart murmur   . History of echocardiogram    aortic sclerosis per echo 12/09 EF 65%, otherwise normal  . History of hemorrhoids    BLEEDING  . Hypertension    Diagnosed 1995   . Pancreatic pseudocyst    a. s/p remote drainage 2006.  Marland Kitchen Thrombocytopenia ([place])    Seen on oldest labs in system from 2004  . Vitamin B 12 deficiency    orally replaced    Family History  Problem Relation Age of Onset  . Diabetes Mother   . Hyperlipidemia Mother   . Hypertension Mother   . Cancer Father   . Hypertension Father   . Diabetes Sister   . Hypertension Sister   . Cancer Brother   . Heart attack Neg Hx     Past Surgical History:  Procedure Laterality Date  . BACK SURGERY     "total of 3 times" S/P fall   . CARPAL TUNNEL RELEASE Bilateral   .  CHOLECYSTECTOMY  1990's  . COLONOSCOPY    . CORONARY ANGIOPLASTY  11/11/11  . CORONARY ANGIOPLASTY WITH STENT PLACEMENT  09/30/2011   "1 then; makes a total of 4"  . CORONARY ANGIOPLASTY WITH STENT PLACEMENT  11/11/11   "1; makes a total of 5"  . INGUINAL HERNIA REPAIR  2003   right  . JOINT REPLACEMENT Right 04/03/2002   hip replacment  . KNEE ARTHROSCOPY  1990's   left  . LEFT HEART CATHETERIZATION WITH CORONARY ANGIOGRAM N/A 09/30/2011   Procedure: LEFT HEART CATHETERIZATION WITH CORONARY ANGIOGRAM;  Surgeon: Jettie Booze, MD;  Location: [facility] CATH LAB;  Service: Cardiovascular;  Laterality: N/A;  possible PCI  . LEFT HEART CATHETERIZATION WITH CORONARY ANGIOGRAM N/A 11/15/2011   Procedure: LEFT HEART CATHETERIZATION WITH CORONARY ANGIOGRAM;  Surgeon: Jettie Booze, MD;  Location: [facility] CATH LAB;  Service: Cardiovascular;  Laterality: N/A;  . PERCUTANEOUS CORONARY STENT INTERVENTION (PCI-S)  09/30/2011   Procedure: PERCUTANEOUS CORONARY STENT INTERVENTION (PCI-S);  Surgeon: Jettie Booze, MD;  Location: [facility] CATH LAB;  Service: Cardiovascular;;  . PERCUTANEOUS CORONARY STENT INTERVENTION (PCI-S) N/A 11/11/2011   Procedure: PERCUTANEOUS CORONARY STENT INTERVENTION (PCI-S);  Surgeon: Jettie Booze, MD;  Location: [facility] CATH LAB;  Service: Cardiovascular;  Laterality: N/A;  . SHOULDER SURGERY Right    X 2  . TONSILLECTOMY  ~ 1948  . TOTAL KNEE ARTHROPLASTY Left 07/23/2016   Procedure: LEFT TOTAL KNEE ARTHROPLASTY;  Surgeon: Marybelle Killings, MD;  Location: [facility];  Service: Orthopedics;  Laterality: Left;   Social History   Occupational History  . Retired    Social History Main Topics  . Smoking status: Former Research scientist (life sciences)  . Smokeless tobacco: Current User    Types: Chew  . Alcohol use No  . Drug use: No  . Sexual activity: Not on file

## 2016-09-23 ENCOUNTER — Ambulatory Visit: Payer: Medicare Other | Admitting: *Deleted

## 2016-09-23 DIAGNOSIS — M25562 Pain in left knee: Secondary | ICD-10-CM | POA: Diagnosis not present

## 2016-09-23 DIAGNOSIS — M25662 Stiffness of left knee, not elsewhere classified: Secondary | ICD-10-CM

## 2016-09-23 DIAGNOSIS — R600 Localized edema: Secondary | ICD-10-CM

## 2016-09-23 NOTE — Therapy (Signed)
[facility] [place], [place], [address] Phone: [phone]   Fax:  [phone]  Physical Therapy Treatment  Patient Details  Name: Keith Espinoza MRN: [id number] Date of Birth: 29-Sep-1941 Referring Provider: [name].  Encounter Date: 09/23/2016      PT End of Session - 09/23/16 0855    Visit Number 11   Number of Visits 16   Date for PT Re-Evaluation 10/16/16   Authorization Type Went to MD 09-21-16   PT Start Time 0830   PT Stop Time 0928   PT Time Calculation (min) 58 min      Past Medical History:  Diagnosis Date  . Allergic rhinitis   . Allergic rhinitis   . Arthritis   . Chronic leg pain    right  . Chronic lower back pain   . Coronary artery disease    a. Stenting to RCA 2004; staged DES to LAD and Cx 2004. DES to mRCA 2012. b. DES to mCx, PTCA to dCx 11/2011. c. Lateral wall MI 2013 s/p PTCA to distal Cx & DES to mid OM2 11/2011. d. Low risk nuc 04/2014, EF wnl.  . Diabetes mellitus    Insulin dependent  . Diabetic neuropathy (HCC)    MILD  . Diverticulosis   . Dysrhythmia   . Gilbert syndrome   . Gout    right wrist; right foot; right elbow; have had it since 1970's  . H/O hiatal hernia   . Heart murmur   . History of echocardiogram    aortic sclerosis per echo 12/09 EF 65%, otherwise normal  . History of hemorrhoids    BLEEDING  . Hypertension    Diagnosed 1995   . Pancreatic pseudocyst    a. s/p remote drainage 2006.  Marland Kitchen Thrombocytopenia ([place])    Seen on oldest labs in system from 2004  . Vitamin B 12 deficiency    orally replaced    Past Surgical History:  Procedure Laterality Date  . BACK SURGERY     "total of 3 times" S/P fall   . CARPAL TUNNEL RELEASE Bilateral   . CHOLECYSTECTOMY  1990's  . COLONOSCOPY    . CORONARY ANGIOPLASTY  11/11/11  . CORONARY ANGIOPLASTY WITH STENT PLACEMENT  09/30/2011   "1 then; makes a total of 4"  . CORONARY ANGIOPLASTY WITH STENT PLACEMENT   11/11/11   "1; makes a total of 5"  . INGUINAL HERNIA REPAIR  2003   right  . JOINT REPLACEMENT Right 04/03/2002   hip replacment  . KNEE ARTHROSCOPY  1990's   left  . LEFT HEART CATHETERIZATION WITH CORONARY ANGIOGRAM N/A 09/30/2011   Procedure: LEFT HEART CATHETERIZATION WITH CORONARY ANGIOGRAM;  Surgeon: Jettie Booze, MD;  Location: [facility] CATH LAB;  Service: Cardiovascular;  Laterality: N/A;  possible PCI  . LEFT HEART CATHETERIZATION WITH CORONARY ANGIOGRAM N/A 11/15/2011   Procedure: LEFT HEART CATHETERIZATION WITH CORONARY ANGIOGRAM;  Surgeon: Jettie Booze, MD;  Location: [facility] CATH LAB;  Service: Cardiovascular;  Laterality: N/A;  . PERCUTANEOUS CORONARY STENT INTERVENTION (PCI-S)  09/30/2011   Procedure: PERCUTANEOUS CORONARY STENT INTERVENTION (PCI-S);  Surgeon: Jettie Booze, MD;  Location: [facility] CATH LAB;  Service: Cardiovascular;;  . PERCUTANEOUS CORONARY STENT INTERVENTION (PCI-S) N/A 11/11/2011   Procedure: PERCUTANEOUS CORONARY STENT INTERVENTION (PCI-S);  Surgeon: Jettie Booze, MD;  Location: [facility] CATH LAB;  Service: Cardiovascular;  Laterality: N/A;  . SHOULDER SURGERY Right    X 2  . TONSILLECTOMY  ~  [place] Left 07/23/2016   Procedure: LEFT TOTAL KNEE ARTHROPLASTY;  Surgeon: Marybelle Killings, MD;  Location: [facility];  Service: Orthopedics;  Laterality: Left;    There were no vitals filed for this visit.      Subjective Assessment - 09/23/16 0838    Subjective My knee doesn't hurt, it just feels tight. I wish we could get it straight. Went to MD yesterday and He said to continue with PT and work on extension. To MD 10-27-15   Limitations Standing;Walking   Patient Stated Goals Get back to normal.   Currently in Pain? No/denies                         [facility] Adult PT Treatment/Exercise - 09/23/16 0001      Exercises   Exercises Knee/Hip     Knee/Hip Exercises: Aerobic   Nustep Level 6 x 20 minutes seat  11 ,10,9 8 for  ROM progression     Knee/Hip Exercises: Standing   Forward Lunges Left;2 sets;10 reps  flexion stretching   Lateral Step Up --   Forward Step Up --     Modalities   Modalities Vasopneumatic     Vasopneumatic   Number Minutes Vasopneumatic  15 minutes   Vasopnuematic Location  Knee   Vasopneumatic Pressure Medium   Vasopneumatic Temperature  36     Manual Therapy   Manual Therapy Joint mobilization;Soft tissue mobilization;Passive ROM   Soft tissue mobilization posterior STW   Passive ROM Lt knee extension  stretching with contract/ Relax  and long holds at end range                  PT Short Term Goals - 09/20/16 0900      PT SHORT TERM GOAL #1   Title Independent with an initial HEP.   Status Achieved     PT SHORT TERM GOAL #2   Title Active left knee extension= -5 degrees to help normalize gait pattern.   Status On-going           PT Long Term Goals - 09/20/16 0900      PT LONG TERM GOAL #1   Title Independent with an advanced HEP.   Status On-going     PT LONG TERM GOAL #2   Title Active left knee flexion to 115 degrees+ so the patient can perform functional tasks and do so with pain not > 2-3/10.   Status On-going     PT LONG TERM GOAL #3   Title Perform a reciprocating stair gait with one railing with pain not > 2-3/10.   Status On-going               Plan - 09/23/16 [id number]    Clinical Impression Statement Pt had F/U visit with MD yesterday and wants him to continue with PT and to focus on extension ROM. He is limited in both flexion and extension on LT knee. He reached 95 degrees of flexion today, But only -8 in extension. We discussed and demonstrated 2# LLLDS for extension. LTGs are ongoing.   Rehab Potential Excellent   PT Frequency 3x / week   PT Duration 8 weeks   PT Treatment/Interventions ADLs/Self Care Home Management;Cryotherapy;Electrical Stimulation;Therapeutic activities;Therapeutic exercise;Patient/family education;Passive  range of motion;Manual techniques;Vasopneumatic Device   PT Next Visit Plan cont with POC for manual and therex to improve ROM, modalities as needed   Consulted and Agree with  Plan of Care Patient      Patient will benefit from skilled therapeutic intervention in order to improve the following deficits and impairments:  Pain, Decreased activity tolerance, Decreased range of motion, Decreased strength, Increased edema  Visit Diagnosis: Acute pain of left knee  Stiffness of left knee, not elsewhere classified  Localized edema     Problem List Patient Active Problem List   Diagnosis Date Noted  . Hyperlipidemia 09/04/2014  . Thrombocytopenia ([place])   . Precordial chest pain 04/05/2014  . Coronary atherosclerosis of native coronary artery 10/01/2013  . Other and unspecified hyperlipidemia 10/01/2013  . Essential hypertension, benign 10/01/2013  . Insulin dependent type 2 diabetes mellitus ([place]) 10/01/2013  . Esophageal reflux 10/01/2013  . Hypertrophy of prostate without urinary obstruction and other lower urinary tract symptoms (LUTS) 10/01/2013    RAMSEUR,CHRIS, PTA 09/23/2016, 9:51 AM  [facility] [place], [place], [address] Phone: [phone]   Fax:  [phone]  Name: KIPP HONER MRN: [id number] Date of Birth: 1940-12-23

## 2016-09-29 ENCOUNTER — Encounter: Payer: Self-pay | Admitting: Physical Therapy

## 2016-09-29 ENCOUNTER — Ambulatory Visit: Payer: Medicare Other | Admitting: Physical Therapy

## 2016-09-29 DIAGNOSIS — M25662 Stiffness of left knee, not elsewhere classified: Secondary | ICD-10-CM

## 2016-09-29 DIAGNOSIS — R600 Localized edema: Secondary | ICD-10-CM

## 2016-09-29 DIAGNOSIS — M25562 Pain in left knee: Secondary | ICD-10-CM

## 2016-09-29 NOTE — Therapy (Signed)
[facility] [place], [place], [address] Phone: [phone]   Fax:  [phone]  Physical Therapy Treatment  Patient Details  Name: Keith Espinoza MRN: [id number] Date of Birth: [birth year] Referring Provider: [name].  Encounter Date: 09/29/2016      PT End of Session - 09/29/16 0817    Visit Number 12   Number of Visits 16   Date for PT Re-Evaluation 10/16/16   PT Start Time 0815   PT Stop Time 0906   PT Time Calculation (min) 51 min   Activity Tolerance Patient tolerated treatment well   Behavior During Therapy [facility] for tasks assessed/performed      Past Medical History:  Diagnosis Date  . Allergic rhinitis   . Allergic rhinitis   . Arthritis   . Chronic leg pain    right  . Chronic lower back pain   . Coronary artery disease    a. Stenting to RCA 2004; staged DES to LAD and Cx 2004. DES to mRCA 2012. b. DES to mCx, PTCA to dCx 11/2011. c. Lateral wall MI 2013 s/p PTCA to distal Cx & DES to mid OM2 11/2011. d. Low risk nuc 04/2014, EF wnl.  . Diabetes mellitus    Insulin dependent  . Diabetic neuropathy (HCC)    MILD  . Diverticulosis   . Dysrhythmia   . Gilbert syndrome   . Gout    right wrist; right foot; right elbow; have had it since 1970's  . H/O hiatal hernia   . Heart murmur   . History of echocardiogram    aortic sclerosis per echo 12/09 EF 65%, otherwise normal  . History of hemorrhoids    BLEEDING  . Hypertension    Diagnosed 1995   . Pancreatic pseudocyst    a. s/p remote drainage 2006.  Marland Kitchen Thrombocytopenia ([place])    Seen on oldest labs in system from 2004  . Vitamin B 12 deficiency    orally replaced    Past Surgical History:  Procedure Laterality Date  . BACK SURGERY     "total of 3 times" S/P fall   . CARPAL TUNNEL RELEASE Bilateral   . CHOLECYSTECTOMY  1990's  . COLONOSCOPY    . CORONARY ANGIOPLASTY  11/11/11  . CORONARY ANGIOPLASTY WITH STENT PLACEMENT  09/30/2011   "1 then;  makes a total of 4"  . CORONARY ANGIOPLASTY WITH STENT PLACEMENT  11/11/11   "1; makes a total of 5"  . INGUINAL HERNIA REPAIR  2003   right  . JOINT REPLACEMENT Right 04/03/2002   hip replacment  . KNEE ARTHROSCOPY  1990's   left  . LEFT HEART CATHETERIZATION WITH CORONARY ANGIOGRAM N/A 09/30/2011   Procedure: LEFT HEART CATHETERIZATION WITH CORONARY ANGIOGRAM;  Surgeon: Jettie Booze, MD;  Location: [facility] CATH LAB;  Service: Cardiovascular;  Laterality: N/A;  possible PCI  . LEFT HEART CATHETERIZATION WITH CORONARY ANGIOGRAM N/A 11/15/2011   Procedure: LEFT HEART CATHETERIZATION WITH CORONARY ANGIOGRAM;  Surgeon: Jettie Booze, MD;  Location: [facility] CATH LAB;  Service: Cardiovascular;  Laterality: N/A;  . PERCUTANEOUS CORONARY STENT INTERVENTION (PCI-S)  09/30/2011   Procedure: PERCUTANEOUS CORONARY STENT INTERVENTION (PCI-S);  Surgeon: Jettie Booze, MD;  Location: [facility] CATH LAB;  Service: Cardiovascular;;  . PERCUTANEOUS CORONARY STENT INTERVENTION (PCI-S) N/A 11/11/2011   Procedure: PERCUTANEOUS CORONARY STENT INTERVENTION (PCI-S);  Surgeon: Jettie Booze, MD;  Location: [facility] CATH LAB;  Service: Cardiovascular;  Laterality: N/A;  . SHOULDER SURGERY Right  X 2  . TONSILLECTOMY  ~ 1948  . TOTAL KNEE ARTHROPLASTY Left 07/23/2016   Procedure: LEFT TOTAL KNEE ARTHROPLASTY;  Surgeon: Marybelle Killings, MD;  Location: [place];  Service: Orthopedics;  Laterality: Left;    There were no vitals filed for this visit.      Subjective Assessment - 09/29/16 0817    Subjective Reports that he doesn't have pain today although yesterday he had some discomfort along B sides of L knee. Patient reports that R knee does not straighten completely.   Limitations Standing;Walking   Patient Stated Goals Get back to normal.   Currently in Pain? No/denies            [facility] PT Assessment - 09/29/16 0001      Assessment   Medical Diagnosis Left total knee replacement.   Onset Date/Surgical Date  07/31/16     Restrictions   Weight Bearing Restrictions No     ROM / Strength   AROM / PROM / Strength AROM     AROM   Overall AROM  Deficits   AROM Assessment Site Knee   Right/Left Knee Left   Left Knee Extension -10   Left Knee Flexion 93                     [facility] Adult PT Treatment/Exercise - 09/29/16 0001      Knee/Hip Exercises: Stretches   Active Hamstring Stretch Left;3 reps;30 seconds     Knee/Hip Exercises: Aerobic   Nustep L6, seat 10-9, x11 min     Knee/Hip Exercises: Standing   Forward Lunges Left;2 sets;10 reps   Terminal Knee Extension Limitations LLE green theraband x20 reps   Lateral Step Up Left;2 sets;10 reps;Hand Hold: 2;Step Height: 6"   Forward Step Up Left;2 sets;10 reps;Hand Hold: 2;Step Height: 6"   Step Down Left;2 sets;10 reps;Hand Hold: 1;Step Height: 4"     Knee/Hip Exercises: Seated   Long Arc Quad Strengthening;Left;2 sets;10 reps;Weights   Long Arc Quad Weight 4 lbs.     Modalities   Modalities Vasopneumatic     Vasopneumatic   Number Minutes Vasopneumatic  15 minutes   Vasopnuematic Location  Knee   Vasopneumatic Pressure Medium   Vasopneumatic Temperature  46     Manual Therapy   Manual Therapy Passive ROM   Passive ROM PROM of L knee into extension with holds at end range                  PT Short Term Goals - 09/29/16 [id number]      PT SHORT TERM GOAL #1   Title Independent with an initial HEP.   Status Achieved     PT SHORT TERM GOAL #2   Title Active left knee extension= -5 degrees to help normalize gait pattern.   Status On-going  -10 deg extension AROM 09/29/2016           PT Long Term Goals - 09/20/16 0900      PT LONG TERM GOAL #1   Title Independent with an advanced HEP.   Status On-going     PT LONG TERM GOAL #2   Title Active left knee flexion to 115 degrees+ so the patient can perform functional tasks and do so with pain not > 2-3/10.   Status On-going     PT LONG TERM GOAL #3    Title Perform a reciprocating stair gait with one railing with pain not > 2-3/10.   Status  On-going               Plan - 09/29/16 0853    Clinical Impression Statement Patient arrived to treatment today with no current reports of L knee pain and had no reports of any increased L knee discomfort other than during PROM of L knee into extension. Patient able to complete all exercises in standing with only minimal multimodal cueing for proper exercise technique. Patient remains limited with both extension and flexion ROM of L knee. Patient experienced discomfort with PROM of L knee into extension and tightness of L hip adductors palpated. 10-93 deg of L knee measured today following ROM session. Patient measured as resting in -12 deg extension in supine today with pillow propped under L ankle. Normal vasopneumatic response noted following removal of the modality.   Rehab Potential Excellent   PT Frequency 3x / week   PT Duration 8 weeks   PT Treatment/Interventions ADLs/Self Care Home Management;Cryotherapy;Electrical Stimulation;Therapeutic activities;Therapeutic exercise;Patient/family education;Passive range of motion;Manual techniques;Vasopneumatic Device   PT Next Visit Plan cont with POC for manual and therex to improve ROM, modalities as needed   Consulted and Agree with Plan of Care Patient      Patient will benefit from skilled therapeutic intervention in order to improve the following deficits and impairments:  Pain, Decreased activity tolerance, Decreased range of motion, Decreased strength, Increased edema  Visit Diagnosis: Acute pain of left knee  Stiffness of left knee, not elsewhere classified  Localized edema     Problem List Patient Active Problem List   Diagnosis Date Noted  . Hyperlipidemia 09/04/2014  . Thrombocytopenia ([place])   . Precordial chest pain 04/05/2014  . Coronary atherosclerosis of native coronary artery 10/01/2013  . Other and unspecified  hyperlipidemia 10/01/2013  . Essential hypertension, benign 10/01/2013  . Insulin dependent type 2 diabetes mellitus ([place]) 10/01/2013  . Esophageal reflux 10/01/2013  . Hypertrophy of prostate without urinary obstruction and other lower urinary tract symptoms (LUTS) 10/01/2013    Wynelle Fanny, PTA 09/29/2016, 9:19 AM  [facility] [address] [place], [place], [address] Phone: [phone]   Fax:  [phone]  Name: CHAUN ELLENBERG MRN: [id number] Date of Birth: 1941/08/16

## 2016-10-01 ENCOUNTER — Ambulatory Visit: Payer: Medicare Other | Admitting: Physical Therapy

## 2016-10-01 DIAGNOSIS — M25662 Stiffness of left knee, not elsewhere classified: Secondary | ICD-10-CM

## 2016-10-01 DIAGNOSIS — R600 Localized edema: Secondary | ICD-10-CM | POA: Diagnosis not present

## 2016-10-01 DIAGNOSIS — M25562 Pain in left knee: Secondary | ICD-10-CM

## 2016-10-01 NOTE — Therapy (Signed)
[facility] [place], [place], [address] Phone: [phone]   Fax:  [phone]  Physical Therapy Treatment  Patient Details  Name: Keith Espinoza MRN: [id number] Date of Birth: November 30, 1940 Referring Provider: [name].  Encounter Date: 10/01/2016      PT End of Session - 10/01/16 0820    Visit Number 13   Number of Visits 16   Date for PT Re-Evaluation 10/16/16   PT Start Time 0818   PT Stop Time 0906   PT Time Calculation (min) 48 min   Activity Tolerance Patient tolerated treatment well   Behavior During Therapy [facility] for tasks assessed/performed      Past Medical History:  Diagnosis Date  . Allergic rhinitis   . Allergic rhinitis   . Arthritis   . Chronic leg pain    right  . Chronic lower back pain   . Coronary artery disease    a. Stenting to RCA 2004; staged DES to LAD and Cx 2004. DES to mRCA 2012. b. DES to mCx, PTCA to dCx 11/2011. c. Lateral wall MI 2013 s/p PTCA to distal Cx & DES to mid OM2 11/2011. d. Low risk nuc 04/2014, EF wnl.  . Diabetes mellitus    Insulin dependent  . Diabetic neuropathy (HCC)    MILD  . Diverticulosis   . Dysrhythmia   . Gilbert syndrome   . Gout    right wrist; right foot; right elbow; have had it since 1970's  . H/O hiatal hernia   . Heart murmur   . History of echocardiogram    aortic sclerosis per echo 12/09 EF 65%, otherwise normal  . History of hemorrhoids    BLEEDING  . Hypertension    Diagnosed 1995   . Pancreatic pseudocyst    a. s/p remote drainage 2006.  Marland Kitchen Thrombocytopenia ([place])    Seen on oldest labs in system from 2004  . Vitamin B 12 deficiency    orally replaced    Past Surgical History:  Procedure Laterality Date  . BACK SURGERY     "total of 3 times" S/P fall   . CARPAL TUNNEL RELEASE Bilateral   . CHOLECYSTECTOMY  1990's  . COLONOSCOPY    . CORONARY ANGIOPLASTY  11/11/11  . CORONARY ANGIOPLASTY WITH STENT PLACEMENT  09/30/2011   "1 then;  makes a total of 4"  . CORONARY ANGIOPLASTY WITH STENT PLACEMENT  11/11/11   "1; makes a total of 5"  . INGUINAL HERNIA REPAIR  2003   right  . JOINT REPLACEMENT Right 04/03/2002   hip replacment  . KNEE ARTHROSCOPY  1990's   left  . LEFT HEART CATHETERIZATION WITH CORONARY ANGIOGRAM N/A 09/30/2011   Procedure: LEFT HEART CATHETERIZATION WITH CORONARY ANGIOGRAM;  Surgeon: Jettie Booze, MD;  Location: [facility] CATH LAB;  Service: Cardiovascular;  Laterality: N/A;  possible PCI  . LEFT HEART CATHETERIZATION WITH CORONARY ANGIOGRAM N/A 11/15/2011   Procedure: LEFT HEART CATHETERIZATION WITH CORONARY ANGIOGRAM;  Surgeon: Jettie Booze, MD;  Location: [facility] CATH LAB;  Service: Cardiovascular;  Laterality: N/A;  . PERCUTANEOUS CORONARY STENT INTERVENTION (PCI-S)  09/30/2011   Procedure: PERCUTANEOUS CORONARY STENT INTERVENTION (PCI-S);  Surgeon: Jettie Booze, MD;  Location: [facility] CATH LAB;  Service: Cardiovascular;;  . PERCUTANEOUS CORONARY STENT INTERVENTION (PCI-S) N/A 11/11/2011   Procedure: PERCUTANEOUS CORONARY STENT INTERVENTION (PCI-S);  Surgeon: Jettie Booze, MD;  Location: [facility] CATH LAB;  Service: Cardiovascular;  Laterality: N/A;  . SHOULDER SURGERY Right  X 2  . TONSILLECTOMY  ~ 1948  . TOTAL KNEE ARTHROPLASTY Left 07/23/2016   Procedure: LEFT TOTAL KNEE ARTHROPLASTY;  Surgeon: Marybelle Killings, MD;  Location: [place];  Service: Orthopedics;  Laterality: Left;    There were no vitals filed for this visit.      Subjective Assessment - 10/01/16 0819    Subjective Reports that he continues to have stinging pain along both sides of inferior knee just below L patella.    Limitations Standing;Walking   Patient Stated Goals Get back to normal.   Currently in Pain? Yes   Pain Score --  No numerical rating provided   Pain Location Knee   Pain Orientation Right;Left;Lower;Medial;Lateral   Pain Type Surgical pain   Pain Onset More than a month ago            [facility] PT  Assessment - 10/01/16 0001      Assessment   Medical Diagnosis Left total knee replacement.   Onset Date/Surgical Date 07/31/16     Restrictions   Weight Bearing Restrictions No     ROM / Strength   AROM / PROM / Strength AROM     AROM   Overall AROM  Deficits   AROM Assessment Site Knee   Right/Left Knee Left   Left Knee Extension -10   Left Knee Flexion 91                     [facility] Adult PT Treatment/Exercise - 10/01/16 0001      Knee/Hip Exercises: Aerobic   Nustep L6, seat 12, x11 min     Knee/Hip Exercises: Machines for Strengthening   Cybex Knee Extension 10# 3x10 reps   Cybex Knee Flexion 30# 3x10 reps LLE only     Knee/Hip Exercises: Standing   Forward Lunges Left;2 sets;10 reps   Lateral Step Up Left;3 sets;10 reps;Hand Hold: 2;Step Height: 6"   Forward Step Up Left;3 sets;10 reps;Hand Hold: 2;Step Height: 6"   Step Down Left;3 sets;10 reps;Hand Hold: 2;Step Height: 6"     Knee/Hip Exercises: Supine   Straight Leg Raises AROM;Left;2 sets;10 reps     Modalities   Modalities Vasopneumatic     Vasopneumatic   Number Minutes Vasopneumatic  15 minutes   Vasopnuematic Location  Knee   Vasopneumatic Pressure Medium   Vasopneumatic Temperature  53     Manual Therapy   Manual Therapy Passive ROM;Soft tissue mobilization   Soft tissue mobilization L patellar mobility to promote proper patella mobility; STW with pressure to L medial HS to decrease tightness   Passive ROM PROM of L knee into extension with holds at end range                  PT Short Term Goals - 09/29/16 0856      PT SHORT TERM GOAL #1   Title Independent with an initial HEP.   Status Achieved     PT SHORT TERM GOAL #2   Title Active left knee extension= -5 degrees to help normalize gait pattern.   Status On-going  -10 deg extension AROM 09/29/2016           PT Long Term Goals - 09/20/16 0900      PT LONG TERM GOAL #1   Title Independent with an advanced  HEP.   Status On-going     PT LONG TERM GOAL #2   Title Active left knee flexion to 115 degrees+ so the patient  can perform functional tasks and do so with pain not > 2-3/10.   Status On-going     PT LONG TERM GOAL #3   Title Perform a reciprocating stair gait with one railing with pain not > 2-3/10.   Status On-going               Plan - 10/01/16 [id number]    Clinical Impression Statement Patient presented in clinic today with continued reports of inferior L knee discomfort that patient describes as stinging just inferior to L patella. Patient introduced to machine knee strengthening with no complaints of any L knee pain only of R knee pain with machine knee extension. Patient able to tolerate machine knee flexion using only LLE. Patient continues to tolerate step up activities well with no complaints. L knee extensor lag observed with SLR and L knee flexion in standing. AROM L knee measured as 10-91 deg today in supine. Normal modalities response noted following removal of the modalities.   Rehab Potential Excellent   PT Frequency 3x / week   PT Duration 8 weeks   PT Treatment/Interventions ADLs/Self Care Home Management;Cryotherapy;Electrical Stimulation;Therapeutic activities;Therapeutic exercise;Patient/family education;Passive range of motion;Manual techniques;Vasopneumatic Device   PT Next Visit Plan cont with POC for manual and therex to improve ROM, modalities as needed   Consulted and Agree with Plan of Care Patient      Patient will benefit from skilled therapeutic intervention in order to improve the following deficits and impairments:  Pain, Decreased activity tolerance, Decreased range of motion, Decreased strength, Increased edema  Visit Diagnosis: Acute pain of left knee  Stiffness of left knee, not elsewhere classified  Localized edema     Problem List Patient Active Problem List   Diagnosis Date Noted  . Hyperlipidemia 09/04/2014  . Thrombocytopenia ([place])    . Precordial chest pain 04/05/2014  . Coronary atherosclerosis of native coronary artery 10/01/2013  . Other and unspecified hyperlipidemia 10/01/2013  . Essential hypertension, benign 10/01/2013  . Insulin dependent type 2 diabetes mellitus ([place]) 10/01/2013  . Esophageal reflux 10/01/2013  . Hypertrophy of prostate without urinary obstruction and other lower urinary tract symptoms (LUTS) 10/01/2013    Wynelle Fanny, PTA 10/01/2016, 9:12 AM  [facility] [place], [place], [address] Phone: [phone]   Fax:  [phone]  Name: Keith Espinoza MRN: [id number] Date of Birth: May 31, 1941

## 2016-10-05 ENCOUNTER — Ambulatory Visit: Payer: Medicare Other | Attending: Orthopaedic Surgery | Admitting: Physical Therapy

## 2016-10-05 ENCOUNTER — Encounter: Payer: Self-pay | Admitting: Physical Therapy

## 2016-10-05 DIAGNOSIS — R600 Localized edema: Secondary | ICD-10-CM | POA: Diagnosis not present

## 2016-10-05 DIAGNOSIS — M25662 Stiffness of left knee, not elsewhere classified: Secondary | ICD-10-CM

## 2016-10-05 DIAGNOSIS — M25562 Pain in left knee: Secondary | ICD-10-CM

## 2016-10-05 NOTE — Therapy (Signed)
[facility] [place], [place], [address] Phone: [phone]   Fax:  [phone]  Physical Therapy Treatment  Patient Details  Name: Keith Espinoza MRN: [id number] Date of Birth: [birth year] Referring Provider: [name].  Encounter Date: 10/05/2016      PT End of Session - 10/05/16 0825    Visit Number 14   Number of Visits 16   Date for PT Re-Evaluation 10/16/16   PT Start Time 0820   PT Stop Time 0910   PT Time Calculation (min) 50 min   Activity Tolerance Patient tolerated treatment well   Behavior During Therapy [facility] for tasks assessed/performed      Past Medical History:  Diagnosis Date  . Allergic rhinitis   . Allergic rhinitis   . Arthritis   . Chronic leg pain    right  . Chronic lower back pain   . Coronary artery disease    a. Stenting to RCA 2004; staged DES to LAD and Cx 2004. DES to mRCA 2012. b. DES to mCx, PTCA to dCx 11/2011. c. Lateral wall MI 2013 s/p PTCA to distal Cx & DES to mid OM2 11/2011. d. Low risk nuc 04/2014, EF wnl.  . Diabetes mellitus    Insulin dependent  . Diabetic neuropathy (HCC)    MILD  . Diverticulosis   . Dysrhythmia   . Gilbert syndrome   . Gout    right wrist; right foot; right elbow; have had it since 1970's  . H/O hiatal hernia   . Heart murmur   . History of echocardiogram    aortic sclerosis per echo 12/09 EF 65%, otherwise normal  . History of hemorrhoids    BLEEDING  . Hypertension    Diagnosed 1995   . Pancreatic pseudocyst    a. s/p remote drainage 2006.  Marland Kitchen Thrombocytopenia ([place])    Seen on oldest labs in system from 2004  . Vitamin B 12 deficiency    orally replaced    Past Surgical History:  Procedure Laterality Date  . BACK SURGERY     "total of 3 times" S/P fall   . CARPAL TUNNEL RELEASE Bilateral   . CHOLECYSTECTOMY  1990's  . COLONOSCOPY    . CORONARY ANGIOPLASTY  11/11/11  . CORONARY ANGIOPLASTY WITH STENT PLACEMENT  09/30/2011   "1 then;  makes a total of 4"  . CORONARY ANGIOPLASTY WITH STENT PLACEMENT  11/11/11   "1; makes a total of 5"  . INGUINAL HERNIA REPAIR  2003   right  . JOINT REPLACEMENT Right 04/03/2002   hip replacment  . KNEE ARTHROSCOPY  1990's   left  . LEFT HEART CATHETERIZATION WITH CORONARY ANGIOGRAM N/A 09/30/2011   Procedure: LEFT HEART CATHETERIZATION WITH CORONARY ANGIOGRAM;  Surgeon: Jettie Booze, MD;  Location: [facility] C[facility] LAB;  Service: Cardiovascular;  Laterality: N/A;  possible PCI  . LEFT HEART CATHETERIZATION WITH CORONARY ANGIOGRAM N/A 11/15/2011   Procedure: LEFT HEART CATHETERIZATION WITH CORONARY ANGIOGRAM;  Surgeon: Jettie Booze, MD;  Location: [facility] CATH LAB;  Service: Cardiovascular;  Laterality: N/A;  . PERCUTANEOUS CORONARY STENT INTERVENTION (PCI-S)  09/30/2011   Procedure: PERCUTANEOUS CORONARY STENT INTERVENTION (PCI-S);  Surgeon: Jettie Booze, MD;  Location: [facility] CATH LAB;  Service: Cardiovascular;;  . PERCUTANEOUS CORONARY STENT INTERVENTION (PCI-S) N/A 11/11/2011   Procedure: PERCUTANEOUS CORONARY STENT INTERVENTION (PCI-S);  Surgeon: Jettie Booze, MD;  [facility] CATH LAB;  Service: Cardiovascular;  Laterality: N/A;  . SHOULDER SURGERY Right  X 2  . TONSILLECTOMY  ~ 1948  . TOTAL KNEE ARTHROPLASTY Left 07/23/2016   Procedure: LEFT TOTAL KNEE ARTHROPLASTY;  Surgeon: Marybelle Killings, MD;  Location: [place];  Service: Orthopedics;  Laterality: Left;    There were no vitals filed for this visit.      Subjective Assessment - 10/05/16 0824    Subjective Reports soreness in L knee since previous treatment.   Limitations Standing;Walking   Patient Stated Goals Get back to normal.   Currently in Pain? Yes   Pain Score 2    Pain Location Knee   Pain Orientation Left   Pain Descriptors / Indicators Sore;Tender   Pain Type Surgical pain   Pain Onset More than a month ago            [facility] PT Assessment - 10/05/16 0001      Assessment   Medical Diagnosis Left  total knee replacement.   Onset Date/Surgical Date 07/31/16   Next MD Visit 10/26/2016     Restrictions   Weight Bearing Restrictions No                     [facility] Adult PT Treatment/Exercise - 10/05/16 0001      Knee/Hip Exercises: Aerobic   Nustep L6, seat 9, x12 min     Knee/Hip Exercises: Machines for Strengthening   Cybex Knee Extension 10# 3x10 reps   Cybex Knee Flexion 30# 3x10 reps LLE only     Knee/Hip Exercises: Standing   Forward Lunges Left;2 sets;10 reps   Terminal Knee Extension Limitations LLE green theraband x30 reps   Lateral Step Up Left;3 sets;10 reps;Hand Hold: 2;Step Height: 8"   Forward Step Up Left;3 sets;10 reps;Hand Hold: 2;Step Height: 8"   Step Down Left;3 sets;10 reps;Hand Hold: 2;Step Height: 6"     Knee/Hip Exercises: Supine   Straight Leg Raises AROM;Left;3 sets;10 reps     Modalities   Modalities Vasopneumatic     Vasopneumatic   Number Minutes Vasopneumatic  15 minutes   Vasopnuematic Location  Knee   Vasopneumatic Pressure Medium   Vasopneumatic Temperature  66                  PT Short Term Goals - 09/29/16 0856      PT SHORT TERM GOAL #1   Title Independent with an initial HEP.   Status Achieved     PT SHORT TERM GOAL #2   Title Active left knee extension= -5 degrees to help normalize gait pattern.   Status On-going  -10 deg extension AROM 09/29/2016           PT Long Term Goals - 09/20/16 0900      PT LONG TERM GOAL #1   Title Independent with an advanced HEP.   Status On-going     PT LONG TERM GOAL #2   Title Active left knee flexion to 115 degrees+ so the patient can perform functional tasks and do so with pain not > 2-3/10.   Status On-going     PT LONG TERM GOAL #3   Title Perform a reciprocating stair gait with one railing with pain not > 2-3/10.   Status On-going               Plan - 10/05/16 0855    Clinical Impression Statement Patient presented in clinic today with 2/10 L  knee soreness upon arrival since previous treatment per patient report. Patient able to complete all  exercises as directed with only minimal multimodal cueing for proper exercise technique. Patient required minimally increased cueing for TKE to improve form. 13 deg L knee extensor lag present during SLR today. Normal modalities response noted following removal of the modalities.   Rehab Potential Excellent   PT Frequency 3x / week   PT Duration 8 weeks   PT Treatment/Interventions ADLs/Self Care Home Management;Cryotherapy;Electrical Stimulation;Therapeutic activities;Therapeutic exercise;Patient/family education;Passive range of motion;Manual techniques;Vasopneumatic Device   PT Next Visit Plan cont with POC for manual and therex to improve ROM, modalities as needed   Consulted and Agree with Plan of Care Patient      Patient will benefit from skilled therapeutic intervention in order to improve the following deficits and impairments:  Pain, Decreased activity tolerance, Decreased range of motion, Decreased strength, Increased edema  Visit Diagnosis: Acute pain of left knee  Stiffness of left knee, not elsewhere classified  Localized edema     Problem List Patient Active Problem List   Diagnosis Date Noted  . Hyperlipidemia 09/04/2014  . Thrombocytopenia ([place])   . Precordial chest pain 04/05/2014  . Coronary atherosclerosis of native coronary artery 10/01/2013  . Other and unspecified hyperlipidemia 10/01/2013  . Essential hypertension, benign 10/01/2013  . Insulin dependent type 2 diabetes mellitus ([place]) 10/01/2013  . Esophageal reflux 10/01/2013  . Hypertrophy of prostate without urinary obstruction and other lower urinary tract symptoms (LUTS) 10/01/2013    Wynelle Fanny, PTA 10/05/2016, 9:32 AM  [facility] [place], [place], [address] Phone: [phone]   Fax:  [phone]  Name: Keith Espinoza MRN:  [id number] Date of Birth: 29-Apr-1941

## 2016-10-07 ENCOUNTER — Encounter: Payer: Self-pay | Admitting: Physical Therapy

## 2016-10-07 ENCOUNTER — Ambulatory Visit: Payer: Medicare Other | Admitting: Physical Therapy

## 2016-10-07 DIAGNOSIS — M25662 Stiffness of left knee, not elsewhere classified: Secondary | ICD-10-CM | POA: Diagnosis not present

## 2016-10-07 DIAGNOSIS — R600 Localized edema: Secondary | ICD-10-CM

## 2016-10-07 DIAGNOSIS — M25562 Pain in left knee: Secondary | ICD-10-CM | POA: Diagnosis not present

## 2016-10-07 NOTE — Therapy (Signed)
[facility] [place], [place], [address] Phone: [phone]   Fax:  [phone]  Physical Therapy Treatment  Patient Details  Name: Keith Espinoza MRN: [id number] Date of Birth: 31-Mar-1941 Referring Provider: [name].  Encounter Date: 10/07/2016      PT End of Session - 10/07/16 0850    Visit Number 15   Number of Visits 16   Date for PT Re-Evaluation 10/16/16   PT Start Time 0814  patient arrived 8:14 yet was not checked in until 8:26   PT Stop Time 0910   PT Time Calculation (min) 56 min   Activity Tolerance Patient tolerated treatment well   Behavior During Therapy [facility] for tasks assessed/performed      Past Medical History:  Diagnosis Date  . Allergic rhinitis   . Allergic rhinitis   . Arthritis   . Chronic leg pain    right  . Chronic lower back pain   . Coronary artery disease    a. Stenting to RCA 2004; staged DES to LAD and Cx 2004. DES to mRCA 2012. b. DES to mCx, PTCA to dCx 11/2011. c. Lateral wall MI 2013 s/p PTCA to distal Cx & DES to mid OM2 11/2011. d. Low risk nuc 04/2014, EF wnl.  . Diabetes mellitus    Insulin dependent  . Diabetic neuropathy (HCC)    MILD  . Diverticulosis   . Dysrhythmia   . Gilbert syndrome   . Gout    right wrist; right foot; right elbow; have had it since 1970's  . H/O hiatal hernia   . Heart murmur   . History of echocardiogram    aortic sclerosis per echo 12/09 EF 65%, otherwise normal  . History of hemorrhoids    BLEEDING  . Hypertension    Diagnosed 1995   . Pancreatic pseudocyst    a. s/p remote drainage 2006.  Marland Kitchen Thrombocytopenia ([place])    Seen on oldest labs in system from 2004  . Vitamin B 12 deficiency    orally replaced    Past Surgical History:  Procedure Laterality Date  . BACK SURGERY     "total of 3 times" S/P fall   . CARPAL TUNNEL RELEASE Bilateral   . CHOLECYSTECTOMY  1990's  . COLONOSCOPY    . CORONARY ANGIOPLASTY  11/11/11  . CORONARY  ANGIOPLASTY WITH STENT PLACEMENT  09/30/2011   "1 then; makes a total of 4"  . CORONARY ANGIOPLASTY WITH STENT PLACEMENT  11/11/11   "1; makes a total of 5"  . INGUINAL HERNIA REPAIR  2003   right  . JOINT REPLACEMENT Right 04/03/2002   hip replacment  . KNEE ARTHROSCOPY  1990's   left  . LEFT HEART CATHETERIZATION WITH CORONARY ANGIOGRAM N/A 09/30/2011   Procedure: LEFT HEART CATHETERIZATION WITH CORONARY ANGIOGRAM;  Surgeon: Jettie Booze, MD;  Location: [facility] CATH LAB;  Service: Cardiovascular;  Laterality: N/A;  possible PCI  . LEFT HEART CATHETERIZATION WITH CORONARY ANGIOGRAM N/A 11/15/2011   Procedure: LEFT HEART CATHETERIZATION WITH CORONARY ANGIOGRAM;  Surgeon: Jettie Booze, MD;  Location: [facility] CATH LAB;  Service: Cardiovascular;  Laterality: N/A;  . PERCUTANEOUS CORONARY STENT INTERVENTION (PCI-S)  09/30/2011   Procedure: PERCUTANEOUS CORONARY STENT INTERVENTION (PCI-S);  Surgeon: Jettie Booze, MD;  Location: [facility] CATH LAB;  Service: Cardiovascular;;  . PERCUTANEOUS CORONARY STENT INTERVENTION (PCI-S) N/A 11/11/2011   Procedure: PERCUTANEOUS CORONARY STENT INTERVENTION (PCI-S);  Surgeon: Jettie Booze, MD;  Location: [facility] CATH LAB;  Service: Cardiovascular;  Laterality: N/A;  . SHOULDER SURGERY Right    X 2  . TONSILLECTOMY  ~ 1948  . TOTAL KNEE ARTHROPLASTY Left 07/23/2016   Procedure: LEFT TOTAL KNEE ARTHROPLASTY;  Surgeon: Marybelle Killings, MD;  Location: [place];  Service: Orthopedics;  Laterality: Left;    There were no vitals filed for this visit.      Subjective Assessment - 10/07/16 0827    Subjective Patient reported ongoing stiffness in knee   Limitations Standing;Walking   Patient Stated Goals Get back to normal.   Currently in Pain? Yes   Pain Score 2    Pain Location Knee   Pain Orientation Left   Pain Descriptors / Indicators Sore   Pain Type Surgical pain   Pain Onset More than a month ago   Pain Frequency Intermittent   Aggravating Factors   ROM/prolong activity   Pain Relieving Factors at rest            [facility] PT Assessment - 10/07/16 0001      AROM   AROM Assessment Site Knee   Right/Left Knee Left   Left Knee Extension -12   Left Knee Flexion 92     PROM   PROM Assessment Site Knee   Right/Left Knee Left   Left Knee Extension -8   Left Knee Flexion 100                     [facility] Adult PT Treatment/Exercise - 10/07/16 0001      Knee/Hip Exercises: Stretches   Active Hamstring Stretch Left;3 reps;30 seconds     Knee/Hip Exercises: Aerobic   Nustep 30min L6 UE/LE     Manual Therapy   Manual Therapy Passive ROM   Passive ROM PROM of L knee into flexion/extension with holds at end range                  PT Short Term Goals - 10/07/16 [id number]      PT SHORT TERM GOAL #1   Title Independent with an initial HEP.   Time 4   Period Weeks   Status Achieved     PT SHORT TERM GOAL #2   Title Active left knee extension= -5 degrees to help normalize gait pattern.   Time 4   Period Weeks   Status On-going  AROM -12 degrees 10/07/16           PT Long Term Goals - 10/07/16 0852      PT LONG TERM GOAL #1   Title Independent with an advanced HEP.   Time 8   Period Weeks   Status On-going     PT LONG TERM GOAL #2   Title Active left knee flexion to 115 degrees+ so the patient can perform functional tasks and do so with pain not > 2-3/10.   Time 8   Period Weeks   Status On-going  AROM 92 degrees 10/07/16     PT LONG TERM GOAL #3   Title Perform a reciprocating stair gait with one railing with pain not > 2-3/10.   Time 8   Period Weeks   Status On-going               Plan - 10/07/16 [id number]    Clinical Impression Statement Patient tolerated treatment well overall yet has ongoing soreness with left knee flexion stretching and increased tightness in knee. Patient arrived with his knee very stiff and only able to achieve  90 degrees actively then after manual stretching was able  to increase to 92 degrees. Patient unable to meet any further goals due to ROM deficts. Educated patient on daily manual stretching.   Rehab Potential Excellent   PT Frequency 3x / week   PT Duration 8 weeks   PT Treatment/Interventions ADLs/Self Care Home Management;Cryotherapy;Electrical Stimulation;Therapeutic activities;Therapeutic exercise;Patient/family education;Passive range of motion;Manual techniques;Vasopneumatic Device   PT Next Visit Plan cont with POC for manual and therex to improve ROM, modalities as needed   Consulted and Agree with Plan of Care Patient      Patient will benefit from skilled therapeutic intervention in order to improve the following deficits and impairments:  Pain, Decreased activity tolerance, Decreased range of motion, Decreased strength, Increased edema  Visit Diagnosis: Acute pain of left knee  Stiffness of left knee, not elsewhere classified  Localized edema     Problem List Patient Active Problem List   Diagnosis Date Noted  . Hyperlipidemia 09/04/2014  . Thrombocytopenia ([place])   . Precordial chest pain 04/05/2014  . Coronary atherosclerosis of native coronary artery 10/01/2013  . Other and unspecified hyperlipidemia 10/01/2013  . Essential hypertension, benign 10/01/2013  . Insulin dependent type 2 diabetes mellitus ([place]) 10/01/2013  . Esophageal reflux 10/01/2013  . Hypertrophy of prostate without urinary obstruction and other lower urinary tract symptoms (LUTS) 10/01/2013    DUNFORD, CHRISTINA P, PTA 10/07/2016, 9:11 AM  [facility] [place], [place], [address] Phone: [phone]   Fax:  [phone]  Name: CORRAN PERSELL MRN: [id number] Date of Birth: 1941/02/06

## 2016-10-11 ENCOUNTER — Ambulatory Visit: Payer: Medicare Other | Admitting: Physical Therapy

## 2016-10-12 ENCOUNTER — Ambulatory Visit: Payer: Medicare Other | Admitting: *Deleted

## 2016-10-12 DIAGNOSIS — R600 Localized edema: Secondary | ICD-10-CM

## 2016-10-12 DIAGNOSIS — M25662 Stiffness of left knee, not elsewhere classified: Secondary | ICD-10-CM

## 2016-10-12 DIAGNOSIS — M25562 Pain in left knee: Secondary | ICD-10-CM

## 2016-10-12 NOTE — Therapy (Signed)
[facility] [place], [place], [address] Phone: [phone]   Fax:  [phone]  Physical Therapy Treatment  Patient Details  Name: Keith Espinoza MRN: [id number] Date of Birth: 01-28-1941 Referring Provider: [name].  Encounter Date: 10/12/2016      PT End of Session - 10/12/16 0835    Visit Number 16   Number of Visits 16   Date for PT Re-Evaluation 10/16/16   Authorization Type Went to MD 09-21-16   PT Start Time 0818   PT Stop Time 0918   PT Time Calculation (min) 60 min      Past Medical History:  Diagnosis Date  . Allergic rhinitis   . Allergic rhinitis   . Arthritis   . Chronic leg pain    right  . Chronic lower back pain   . Coronary artery disease    a. Stenting to RCA 2004; staged DES to LAD and Cx 2004. DES to mRCA 2012. b. DES to mCx, PTCA to dCx 11/2011. c. Lateral wall MI 2013 s/p PTCA to distal Cx & DES to mid OM2 11/2011. d. Low risk nuc 04/2014, EF wnl.  . Diabetes mellitus    Insulin dependent  . Diabetic neuropathy (HCC)    MILD  . Diverticulosis   . Dysrhythmia   . Gilbert syndrome   . Gout    right wrist; right foot; right elbow; have had it since 1970's  . H/O hiatal hernia   . Heart murmur   . History of echocardiogram    aortic sclerosis per echo 12/09 EF 65%, otherwise normal  . History of hemorrhoids    BLEEDING  . Hypertension    Diagnosed 1995   . Pancreatic pseudocyst    a. s/p remote drainage 2006.  Marland Kitchen Thrombocytopenia ([place])    Seen on oldest labs in system from 2004  . Vitamin B 12 deficiency    orally replaced    Past Surgical History:  Procedure Laterality Date  . BACK SURGERY     "total of 3 times" S/P fall   . CARPAL TUNNEL RELEASE Bilateral   . CHOLECYSTECTOMY  1990's  . COLONOSCOPY    . CORONARY ANGIOPLASTY  11/11/11  . CORONARY ANGIOPLASTY WITH STENT PLACEMENT  09/30/2011   "1 then; makes a total of 4"  . CORONARY ANGIOPLASTY WITH STENT PLACEMENT  11/11/11    "1; makes a total of 5"  . INGUINAL HERNIA REPAIR  2003   right  . JOINT REPLACEMENT Right 04/03/2002   hip replacment  . KNEE ARTHROSCOPY  1990's   left  . LEFT HEART CATHETERIZATION WITH CORONARY ANGIOGRAM N/A 09/30/2011   Procedure: LEFT HEART CATHETERIZATION WITH CORONARY ANGIOGRAM;  Surgeon: Jettie Booze, MD;  Location: [facility] CATH LAB;  Service: Cardiovascular;  Laterality: N/A;  possible PCI  . LEFT HEART CATHETERIZATION WITH CORONARY ANGIOGRAM N/A 11/15/2011   Procedure: LEFT HEART CATHETERIZATION WITH CORONARY ANGIOGRAM;  Surgeon: Jettie Booze, MD;  Location: [facility] CATH LAB;  Service: Cardiovascular;  Laterality: N/A;  . PERCUTANEOUS CORONARY STENT INTERVENTION (PCI-S)  09/30/2011   Procedure: PERCUTANEOUS CORONARY STENT INTERVENTION (PCI-S);  Surgeon: Jettie Booze, MD;  Location: [facility] CATH LAB;  Service: Cardiovascular;;  . PERCUTANEOUS CORONARY STENT INTERVENTION (PCI-S) N/A 11/11/2011   Procedure: PERCUTANEOUS CORONARY STENT INTERVENTION (PCI-S);  Surgeon: Jettie Booze, MD;  Location: [facility] CATH LAB;  Service: Cardiovascular;  Laterality: N/A;  . SHOULDER SURGERY Right    X 2  . TONSILLECTOMY  ~  [place] Left 07/23/2016   Procedure: LEFT TOTAL KNEE ARTHROPLASTY;  Surgeon: Marybelle Killings, MD;  Location: [place];  Service: Orthopedics;  Laterality: Left;    There were no vitals filed for this visit.      Subjective Assessment - 10/12/16 0829    Subjective Patient reported ongoing stiffness in knee.                           To MD 10-26-16   Limitations Standing;Walking   Patient Stated Goals Get back to normal.   Currently in Pain? Yes   Pain Score 3    Pain Location Knee   Pain Orientation Left   Pain Descriptors / Indicators Sore   Pain Type Surgical pain   Pain Onset More than a month ago   Pain Frequency Intermittent                         [facility] Adult PT Treatment/Exercise - 10/12/16 0001      Knee/Hip  Exercises: Aerobic   Nustep x 18 min L6 UE/LE, seat 9 for ROM     Knee/Hip Exercises: Machines for Strengthening   Cybex Knee Extension 10# 3x10 reps   Cybex Knee Flexion 20# 3x10 reps LLE only     Knee/Hip Exercises: Standing   Forward Lunges Left;2 sets;10 reps     Modalities   Modalities Vasopneumatic     Vasopneumatic   Number Minutes Vasopneumatic  15 minutes   Vasopnuematic Location  Knee   Vasopneumatic Pressure Medium   Vasopneumatic Temperature  38     Manual Therapy   Manual Therapy Passive ROM   Passive ROM PROM of L knee into flexion/extension with holds at end range with focus on Extension                  PT Short Term Goals - 10/07/16 [id number]      PT SHORT TERM GOAL #1   Title Independent with an initial HEP.   Time 4   Period Weeks   Status Achieved     PT SHORT TERM GOAL #2   Title Active left knee extension= -5 degrees to help normalize gait pattern.   Time 4   Period Weeks   Status On-going  AROM -12 degrees 10/07/16           PT Long Term Goals - 10/07/16 0852      PT LONG TERM GOAL #1   Title Independent with an advanced HEP.   Time 8   Period Weeks   Status On-going     PT LONG TERM GOAL #2   Title Active left knee flexion to 115 degrees+ so the patient can perform functional tasks and do so with pain not > 2-3/10.   Time 8   Period Weeks   Status On-going  AROM 92 degrees 10/07/16     PT LONG TERM GOAL #3   Title Perform a reciprocating stair gait with one railing with pain not > 2-3/10.   Time 8   Period Weeks   Status On-going               Plan - 10/12/16 [id number]    Clinical Impression Statement Pt was able to complete all Therex and activities for LT knee. His main concern is knee extension and manual therapy was focused on that with long end-range holds. He still  has ROM deficits in both motions   Rehab Potential Excellent   PT Frequency 3x / week   PT Duration 8 weeks   PT Treatment/Interventions ADLs/Self  Care Home Management;Cryotherapy;Electrical Stimulation;Therapeutic activities;Therapeutic exercise;Patient/family education;Passive range of motion;Manual techniques;Vasopneumatic Device   PT Next Visit Plan cont with POC for manual and therex to improve ROM, modalities as needed      Look for Recert   Consulted and Agree with Plan of Care Patient      Patient will benefit from skilled therapeutic intervention in order to improve the following deficits and impairments:  Pain, Decreased activity tolerance, Decreased range of motion, Decreased strength, Increased edema  Visit Diagnosis: Acute pain of left knee  Stiffness of left knee, not elsewhere classified  Localized edema     Problem List Patient Active Problem List   Diagnosis Date Noted  . Hyperlipidemia 09/04/2014  . Thrombocytopenia ([place])   . Precordial chest pain 04/05/2014  . Coronary atherosclerosis of native coronary artery 10/01/2013  . Other and unspecified hyperlipidemia 10/01/2013  . Essential hypertension, benign 10/01/2013  . Insulin dependent type 2 diabetes mellitus ([place]) 10/01/2013  . Esophageal reflux 10/01/2013  . Hypertrophy of prostate without urinary obstruction and other lower urinary tract symptoms (LUTS) 10/01/2013    Kelce Bouton,CHRIS, PTA 10/12/2016, 9:46 AM  [facility] [place], [place], [address] Phone: [phone]   Fax:  [phone]  Name: Keith Espinoza MRN: [id number] Date of Birth: Mar 14, 1941

## 2016-10-13 NOTE — Therapy (Signed)
[facility] [place], [place], [address] Phone: [phone]   Fax:  [phone]  Physical Therapy Treatment  Patient Details  Name: Keith Espinoza MRN: [id number] Date of Birth: 1941/08/31 Referring Provider: [name].  Encounter Date: 10/07/2016      PT End of Session - 10/12/16 0835    Visit Number 16   Number of Visits 16   Date for PT Re-Evaluation 12/15/16   Authorization Type Went to MD 09-21-16   PT Start Time 0818   PT Stop Time 0918   PT Time Calculation (min) 60 min      Past Medical History:  Diagnosis Date  . Allergic rhinitis   . Allergic rhinitis   . Arthritis   . Chronic leg pain    right  . Chronic lower back pain   . Coronary artery disease    a. Stenting to RCA 2004; staged DES to LAD and Cx 2004. DES to mRCA 2012. b. DES to mCx, PTCA to dCx 11/2011. c. Lateral wall MI 2013 s/p PTCA to distal Cx & DES to mid OM2 11/2011. d. Low risk nuc 04/2014, EF wnl.  . Diabetes mellitus    Insulin dependent  . Diabetic neuropathy (HCC)    MILD  . Diverticulosis   . Dysrhythmia   . Gilbert syndrome   . Gout    right wrist; right foot; right elbow; have had it since 1970's  . H/O hiatal hernia   . Heart murmur   . History of echocardiogram    aortic sclerosis per echo 12/09 EF 65%, otherwise normal  . History of hemorrhoids    BLEEDING  . Hypertension    Diagnosed 1995   . Pancreatic pseudocyst    a. s/p remote drainage 2006.  Marland Kitchen Thrombocytopenia ([place])    Seen on oldest labs in system from 2004  . Vitamin B 12 deficiency    orally replaced    Past Surgical History:  Procedure Laterality Date  . BACK SURGERY     "total of 3 times" S/P fall   . CARPAL TUNNEL RELEASE Bilateral   . CHOLECYSTECTOMY  1990's  . COLONOSCOPY    . CORONARY ANGIOPLASTY  11/11/11  . CORONARY ANGIOPLASTY WITH STENT PLACEMENT  09/30/2011   "1 then; makes a total of 4"  . CORONARY ANGIOPLASTY WITH STENT PLACEMENT  11/11/11    "1; makes a total of 5"  . INGUINAL HERNIA REPAIR  2003   right  . JOINT REPLACEMENT Right 04/03/2002   hip replacment  . KNEE ARTHROSCOPY  1990's   left  . LEFT HEART CATHETERIZATION WITH CORONARY ANGIOGRAM N/A 09/30/2011   Procedure: LEFT HEART CATHETERIZATION WITH CORONARY ANGIOGRAM;  Surgeon: Jettie Booze, MD;  Location: [facility] CATH LAB;  Service: Cardiovascular;  Laterality: N/A;  possible PCI  . LEFT HEART CATHETERIZATION WITH CORONARY ANGIOGRAM N/A 11/15/2011   Procedure: LEFT HEART CATHETERIZATION WITH CORONARY ANGIOGRAM;  Surgeon: Jettie Booze, MD;  Location: [facility] CATH LAB;  Service: Cardiovascular;  Laterality: N/A;  . PERCUTANEOUS CORONARY STENT INTERVENTION (PCI-S)  09/30/2011   Procedure: PERCUTANEOUS CORONARY STENT INTERVENTION (PCI-S);  Surgeon: Jettie Booze, MD;  Location: [facility] CATH LAB;  Service: Cardiovascular;;  . PERCUTANEOUS CORONARY STENT INTERVENTION (PCI-S) N/A 11/11/2011   Procedure: PERCUTANEOUS CORONARY STENT INTERVENTION (PCI-S);  Surgeon: Jettie Booze, MD;  Location: [facility] CATH LAB;  Service: Cardiovascular;  Laterality: N/A;  . SHOULDER SURGERY Right    X 2  . TONSILLECTOMY  ~  [place] Left 07/23/2016   Procedure: LEFT TOTAL KNEE ARTHROPLASTY;  Surgeon: Marybelle Killings, MD;  Location: [place];  Service: Orthopedics;  Laterality: Left;    There were no vitals filed for this visit.      Subjective Assessment - 10/12/16 0829    Subjective Patient reported ongoing stiffness in knee.                           To MD 10-26-16   Limitations Standing;Walking   Patient Stated Goals Get back to normal.   Currently in Pain? Yes   Pain Score 3    Pain Location Knee   Pain Orientation Left   Pain Descriptors / Indicators Sore   Pain Type Surgical pain   Pain Onset More than a month ago   Pain Frequency Intermittent                                   PT Short Term Goals - 10/07/16 [id number]      PT SHORT  TERM GOAL #1   Title Independent with an initial HEP.   Time 4   Period Weeks   Status Achieved     PT SHORT TERM GOAL #2   Title Active left knee extension= -5 degrees to help normalize gait pattern.   Time 4   Period Weeks   Status On-going  AROM -12 degrees 10/07/16           PT Long Term Goals - 10/07/16 0852      PT LONG TERM GOAL #1   Title Independent with an advanced HEP.   Time 8   Period Weeks   Status On-going     PT LONG TERM GOAL #2   Title Active left knee flexion to 115 degrees+ so the patient can perform functional tasks and do so with pain not > 2-3/10.   Time 8   Period Weeks   Status On-going  AROM 92 degrees 10/07/16     PT LONG TERM GOAL #3   Title Perform a reciprocating stair gait with one railing with pain not > 2-3/10.   Time 8   Period Weeks   Status On-going               Plan - 10/12/16 [id number]    Clinical Impression Statement Pt was able to complete all Therex and activities for LT knee. His main concern is knee extension and manual therapy was focused on that with long end-range holds. He still has ROM deficits in both motions   Rehab Potential Excellent   PT Frequency 3x / week   PT Duration 8 weeks   PT Treatment/Interventions ADLs/Self Care Home Management;Cryotherapy;Electrical Stimulation;Therapeutic activities;Therapeutic exercise;Patient/family education;Passive range of motion;Manual techniques;Vasopneumatic Device   PT Next Visit Plan cont with POC for manual and therex to improve ROM, modalities as needed      Look for Recert   Consulted and Agree with Plan of Care Patient      Patient will benefit from skilled therapeutic intervention in order to improve the following deficits and impairments:  Pain, Decreased activity tolerance, Decreased range of motion, Decreased strength, Increased edema  Visit Diagnosis: Acute pain of left knee  Stiffness of left knee, not elsewhere classified  Localized edema     Problem  List Patient Active Problem List   Diagnosis  Date Noted  . Hyperlipidemia 09/04/2014  . Thrombocytopenia ([place])   . Precordial chest pain 04/05/2014  . Coronary atherosclerosis of native coronary artery 10/01/2013  . Other and unspecified hyperlipidemia 10/01/2013  . Essential hypertension, benign 10/01/2013  . Insulin dependent type 2 diabetes mellitus ([place]) 10/01/2013  . Esophageal reflux 10/01/2013  . Hypertrophy of prostate without urinary obstruction and other lower urinary tract symptoms (LUTS) 10/01/2013    DUNFORD, CHRISTINA P 10/13/2016, 2:11 PM  [facility] [place], [place], [address] Phone: [phone]   Fax:  [phone]  Name: JERIEL HARDWELL MRN: [id number] Date of Birth: [birth year]

## 2016-10-14 ENCOUNTER — Encounter: Payer: Self-pay | Admitting: Physical Therapy

## 2016-10-14 ENCOUNTER — Ambulatory Visit: Payer: Medicare Other | Admitting: Physical Therapy

## 2016-10-14 DIAGNOSIS — M25662 Stiffness of left knee, not elsewhere classified: Secondary | ICD-10-CM | POA: Diagnosis not present

## 2016-10-14 DIAGNOSIS — R600 Localized edema: Secondary | ICD-10-CM

## 2016-10-14 DIAGNOSIS — M25562 Pain in left knee: Secondary | ICD-10-CM

## 2016-10-14 NOTE — Therapy (Signed)
[facility] [place], [place], [address] Phone: [phone]   Fax:  [phone]  Physical Therapy Treatment  Patient Details  Name: Keith Espinoza MRN: [id number] Date of Birth: 05-10-1941 Referring Provider: [name].  Encounter Date: 10/14/2016      PT End of Session - 10/14/16 0853    Visit Number 17   Number of Visits 24   Date for PT Re-Evaluation 11/04/16   PT Start Time 0818   PT Stop Time 0914   PT Time Calculation (min) 56 min   Activity Tolerance Patient tolerated treatment well   Behavior During Therapy [facility] for tasks assessed/performed      Past Medical History:  Diagnosis Date  . Allergic rhinitis   . Allergic rhinitis   . Arthritis   . Chronic leg pain    right  . Chronic lower back pain   . Coronary artery disease    a. Stenting to RCA 2004; staged DES to LAD and Cx 2004. DES to mRCA 2012. b. DES to mCx, PTCA to dCx 11/2011. c. Lateral wall MI 2013 s/p PTCA to distal Cx & DES to mid OM2 11/2011. d. Low risk nuc 04/2014, EF wnl.  . Diabetes mellitus    Insulin dependent  . Diabetic neuropathy (HCC)    MILD  . Diverticulosis   . Dysrhythmia   . Gilbert syndrome   . Gout    right wrist; right foot; right elbow; have had it since 1970's  . H/O hiatal hernia   . Heart murmur   . History of echocardiogram    aortic sclerosis per echo 12/09 EF 65%, otherwise normal  . History of hemorrhoids    BLEEDING  . Hypertension    Diagnosed 1995   . Pancreatic pseudocyst    a. s/p remote drainage 2006.  Marland Kitchen Thrombocytopenia ([place])    Seen on oldest labs in system from 2004  . Vitamin B 12 deficiency    orally replaced    Past Surgical History:  Procedure Laterality Date  . BACK SURGERY     "total of 3 times" S/P fall   . CARPAL TUNNEL RELEASE Bilateral   . CHOLECYSTECTOMY  1990's  . COLONOSCOPY    . CORONARY ANGIOPLASTY  11/11/11  . CORONARY ANGIOPLASTY WITH STENT PLACEMENT  09/30/2011   "1 then;  makes a total of 4"  . CORONARY ANGIOPLASTY WITH STENT PLACEMENT  11/11/11   "1; makes a total of 5"  . INGUINAL HERNIA REPAIR  2003   right  . JOINT REPLACEMENT Right 04/03/2002   hip replacment  . KNEE ARTHROSCOPY  1990's   left  . LEFT HEART CATHETERIZATION WITH CORONARY ANGIOGRAM N/A 09/30/2011   Procedure: LEFT HEART CATHETERIZATION WITH CORONARY ANGIOGRAM;  Surgeon: Jettie Booze, MD;  Location: [facility] CATH LAB;  Service: Cardiovascular;  Laterality: N/A;  possible PCI  . LEFT HEART CATHETERIZATION WITH CORONARY ANGIOGRAM N/A 11/15/2011   Procedure: LEFT HEART CATHETERIZATION WITH CORONARY ANGIOGRAM;  Surgeon: Jettie Booze, MD;  Location: [facility] CATH LAB;  Service: Cardiovascular;  Laterality: N/A;  . PERCUTANEOUS CORONARY STENT INTERVENTION (PCI-S)  09/30/2011   Procedure: PERCUTANEOUS CORONARY STENT INTERVENTION (PCI-S);  Surgeon: Jettie Booze, MD;  Location: [facility] CATH LAB;  Service: Cardiovascular;;  . PERCUTANEOUS CORONARY STENT INTERVENTION (PCI-S) N/A 11/11/2011   Procedure: PERCUTANEOUS CORONARY STENT INTERVENTION (PCI-S);  Surgeon: Jettie Booze, MD;  Location: [facility] CATH LAB;  Service: Cardiovascular;  Laterality: N/A;  . SHOULDER SURGERY Right  X 2  . TONSILLECTOMY  ~ 1948  . TOTAL KNEE ARTHROPLASTY Left 07/23/2016   Procedure: LEFT TOTAL KNEE ARTHROPLASTY;  Surgeon: Marybelle Killings, MD;  Location: [place];  Service: Orthopedics;  Laterality: Left;    There were no vitals filed for this visit.      Subjective Assessment - 10/14/16 0828    Subjective Patient reported some increased soreness and weakness for unknown reaseon   Limitations Standing;Walking   Patient Stated Goals Get back to normal.   Currently in Pain? Yes   Pain Score 4    Pain Location Knee   Pain Orientation Left   Pain Descriptors / Indicators Sore;Aching   Pain Type Surgical pain   Pain Onset More than a month ago   Pain Frequency Intermittent   Aggravating Factors  ROM and increased  activity   Pain Relieving Factors at rest            [facility] PT Assessment - 10/14/16 0001      AROM   AROM Assessment Site Knee   Right/Left Knee Left   Left Knee Extension -15   Left Knee Flexion 90     PROM   PROM Assessment Site Knee   Right/Left Knee Left   Left Knee Extension -11   Left Knee Flexion 95                     [facility] Adult PT Treatment/Exercise - 10/14/16 0001      Knee/Hip Exercises: Aerobic   Nustep 62min L6 UE/LE monitored for progression     Knee/Hip Exercises: Standing   Lateral Step Up Left;3 sets;10 reps;Hand Hold: 2;Step Height: 8"   Forward Step Up Left;3 sets;10 reps;Hand Hold: 2;Step Height: 8"     Vasopneumatic   Number Minutes Vasopneumatic  15 minutes   Vasopnuematic Location  Knee   Vasopneumatic Pressure Medium     Manual Therapy   Manual Therapy Passive ROM   Passive ROM PROM of L knee into flexion/extension with holds at end range with focus on Extension                  PT Short Term Goals - 10/14/16 [id number]      PT SHORT TERM GOAL #1   Title Independent with an initial HEP.   Time 4   Period Weeks   Status Achieved     PT SHORT TERM GOAL #2   Title Active left knee extension= -5 degrees to help normalize gait pattern.   Time 4   Period Weeks   Status On-going  AROM -15 degrees 10/14/16           PT Long Term Goals - 10/14/16 0855      PT LONG TERM GOAL #1   Title Independent with an advanced HEP.   Time 8   Period Weeks   Status On-going     PT LONG TERM GOAL #2   Title Active left knee flexion to 115 degrees+ so the patient can perform functional tasks and do so with pain not > 2-3/10.   Time 8   Period Weeks   Status On-going  AROM 90 degrees 10/14/16     PT LONG TERM GOAL #3   Title Perform a reciprocating stair gait with one railing with pain not > 2-3/10.   Time 8   Period Weeks   Status On-going               Plan -  10/14/16 0855    Clinical Impression Statement  Patient tolerated treatment well today yet has increased tightness in left knee today for both flexion and ext. Patient was educated on continued self stretching several times daily with different techniques to improve ROM and functional independence. Patient had decreased ROM today. Patient has reported increased soreness pre treatment for unknown reaseon. Current goals ongoing due to pain and ROM deficts.    Rehab Potential Excellent   PT Frequency 3x / week   PT Duration 8 weeks   PT Treatment/Interventions ADLs/Self Care Home Management;Cryotherapy;Electrical Stimulation;Therapeutic activities;Therapeutic exercise;Patient/family education;Passive range of motion;Manual techniques;Vasopneumatic Device   PT Next Visit Plan cont with POC for manual and therex to improve ROM, modalities as needed (MD. Lorin Mercy 10/26/16)   Consulted and Agree with Plan of Care Patient      Patient will benefit from skilled therapeutic intervention in order to improve the following deficits and impairments:  Pain, Decreased activity tolerance, Decreased range of motion, Decreased strength, Increased edema  Visit Diagnosis: Acute pain of left knee  Stiffness of left knee, not elsewhere classified  Localized edema     Problem List Patient Active Problem List   Diagnosis Date Noted  . Hyperlipidemia 09/04/2014  . Thrombocytopenia ([place])   . Precordial chest pain 04/05/2014  . Coronary atherosclerosis of native coronary artery 10/01/2013  . Other and unspecified hyperlipidemia 10/01/2013  . Essential hypertension, benign 10/01/2013  . Insulin dependent type 2 diabetes mellitus ([place]) 10/01/2013  . Esophageal reflux 10/01/2013  . Hypertrophy of prostate without urinary obstruction and other lower urinary tract symptoms (LUTS) 10/01/2013    DUNFORD, CHRISTINA P, PTA 10/14/2016, 9:14 AM  [facility] [place], [place], [address] Phone: [phone]    Fax:  [phone]  Name: Keith Espinoza MRN: [id number] Date of Birth: 05-25-1941

## 2016-10-19 ENCOUNTER — Ambulatory Visit: Payer: Medicare Other | Admitting: *Deleted

## 2016-10-19 DIAGNOSIS — M25562 Pain in left knee: Secondary | ICD-10-CM

## 2016-10-19 DIAGNOSIS — R600 Localized edema: Secondary | ICD-10-CM | POA: Diagnosis not present

## 2016-10-19 DIAGNOSIS — M25662 Stiffness of left knee, not elsewhere classified: Secondary | ICD-10-CM | POA: Diagnosis not present

## 2016-10-19 NOTE — Therapy (Signed)
[facility] [place], [place], [address] Phone: [phone]   Fax:  [phone]  Physical Therapy Treatment  Patient Details  Name: Keith Espinoza MRN: [id number] Date of Birth: 1941/08/08 Referring Provider: [name].  Encounter Date: 10/19/2016      PT End of Session - 10/19/16 0830    Visit Number 18   Number of Visits 24   Date for PT Re-Evaluation 11/04/16   Authorization Type Went to MD 09-21-16   PT Start Time 0819   PT Stop Time 0919   PT Time Calculation (min) 60 min      Past Medical History:  Diagnosis Date  . Allergic rhinitis   . Allergic rhinitis   . Arthritis   . Chronic leg pain    right  . Chronic lower back pain   . Coronary artery disease    a. Stenting to RCA 2004; staged DES to LAD and Cx 2004. DES to mRCA 2012. b. DES to mCx, PTCA to dCx 11/2011. c. Lateral wall MI 2013 s/p PTCA to distal Cx & DES to mid OM2 11/2011. d. Low risk nuc 04/2014, EF wnl.  . Diabetes mellitus    Insulin dependent  . Diabetic neuropathy (HCC)    MILD  . Diverticulosis   . Dysrhythmia   . Gilbert syndrome   . Gout    right wrist; right foot; right elbow; have had it since 1970's  . H/O hiatal hernia   . Heart murmur   . History of echocardiogram    aortic sclerosis per echo 12/09 EF 65%, otherwise normal  . History of hemorrhoids    BLEEDING  . Hypertension    Diagnosed 1995   . Pancreatic pseudocyst    a. s/p remote drainage 2006.  Marland Kitchen Thrombocytopenia ([place])    Seen on oldest labs in system from 2004  . Vitamin B 12 deficiency    orally replaced    Past Surgical History:  Procedure Laterality Date  . BACK SURGERY     "total of 3 times" S/P fall   . CARPAL TUNNEL RELEASE Bilateral   . CHOLECYSTECTOMY  1990's  . COLONOSCOPY    . CORONARY ANGIOPLASTY  11/11/11  . CORONARY ANGIOPLASTY WITH STENT PLACEMENT  09/30/2011   "1 then; makes a total of 4"  . CORONARY ANGIOPLASTY WITH STENT PLACEMENT  11/11/11    "1; makes a total of 5"  . INGUINAL HERNIA REPAIR  2003   right  . JOINT REPLACEMENT Right 04/03/2002   hip replacment  . KNEE ARTHROSCOPY  1990's   left  . LEFT HEART CATHETERIZATION WITH CORONARY ANGIOGRAM N/A 09/30/2011   Procedure: LEFT HEART CATHETERIZATION WITH CORONARY ANGIOGRAM;  Surgeon: Jettie Booze, MD;  Location: [facility] CATH LAB;  Service: Cardiovascular;  Laterality: N/A;  possible PCI  . LEFT HEART CATHETERIZATION WITH CORONARY ANGIOGRAM N/A 11/15/2011   Procedure: LEFT HEART CATHETERIZATION WITH CORONARY ANGIOGRAM;  Surgeon: Jettie Booze, MD;  Location: [facility] CATH LAB;  Service: Cardiovascular;  Laterality: N/A;  . PERCUTANEOUS CORONARY STENT INTERVENTION (PCI-S)  09/30/2011   Procedure: PERCUTANEOUS CORONARY STENT INTERVENTION (PCI-S);  Surgeon: Jettie Booze, MD;  Location: [facility] CATH LAB;  Service: Cardiovascular;;  . PERCUTANEOUS CORONARY STENT INTERVENTION (PCI-S) N/A 11/11/2011   Procedure: PERCUTANEOUS CORONARY STENT INTERVENTION (PCI-S);  Surgeon: Jettie Booze, MD;  Location: [facility] CATH LAB;  Service: Cardiovascular;  Laterality: N/A;  . SHOULDER SURGERY Right    X 2  . TONSILLECTOMY  ~  [place] Left 07/23/2016   Procedure: LEFT TOTAL KNEE ARTHROPLASTY;  Surgeon: Marybelle Killings, MD;  Location: [place];  Service: Orthopedics;  Laterality: Left;    There were no vitals filed for this visit.      Subjective Assessment - 10/19/16 0827    Subjective Patient reported some increased soreness and weakness for unknown reaseon   Limitations Standing;Walking   Patient Stated Goals Get back to normal.   Currently in Pain? Yes   Pain Score 4    Pain Location Knee   Pain Orientation Left   Pain Descriptors / Indicators Aching;Sore   Pain Type Surgical pain   Pain Onset More than a month ago   Pain Frequency Intermittent                         [facility] Adult PT Treatment/Exercise - 10/19/16 0001      Knee/Hip Exercises:  Aerobic   Nustep 27min L6 UE/LE monitored for progression     Knee/Hip Exercises: Machines for Strengthening   Cybex Knee Extension 10# 3x10 reps  LT LE only   Cybex Knee Flexion 30# 3x10 reps B LEs     Knee/Hip Exercises: Standing   Forward Lunges Left;2 sets;10 reps     Vasopneumatic   Number Minutes Vasopneumatic  15 minutes   Vasopnuematic Location  Knee   Vasopneumatic Pressure Medium     Manual Therapy   Manual Therapy Passive ROM   Passive ROM PROM of L knee into flexion/extension with holds at end range with focus on Extension                  PT Short Term Goals - 10/14/16 [id number]      PT SHORT TERM GOAL #1   Title Independent with an initial HEP.   Time 4   Period Weeks   Status Achieved     PT SHORT TERM GOAL #2   Title Active left knee extension= -5 degrees to help normalize gait pattern.   Time 4   Period Weeks   Status On-going  AROM -15 degrees 10/14/16           PT Long Term Goals - 10/14/16 0855      PT LONG TERM GOAL #1   Title Independent with an advanced HEP.   Time 8   Period Weeks   Status On-going     PT LONG TERM GOAL #2   Title Active left knee flexion to 115 degrees+ so the patient can perform functional tasks and do so with pain not > 2-3/10.   Time 8   Period Weeks   Status On-going  AROM 90 degrees 10/14/16     PT LONG TERM GOAL #3   Title Perform a reciprocating stair gait with one railing with pain not > 2-3/10.   Time 8   Period Weeks   Status On-going               Plan - 10/19/16 0912    Clinical Impression Statement Pt did fairly well with Rx today and was able to complete all therex for LT knee with only minimal pain increase. Rom deficits were still focused on during Rx, but more on Extension during manual PROM.  Current goals are ongoing due to  deficits   Rehab Potential Excellent   PT Frequency 3x / week   PT Duration 8 weeks   PT Treatment/Interventions ADLs/Self Care  Home  Management;Cryotherapy;Electrical Stimulation;Therapeutic activities;Therapeutic exercise;Patient/family education;Passive range of motion;Manual techniques;Vasopneumatic Device   PT Next Visit Plan cont with POC for manual and therex to improve ROM, modalities as needed (MD. Lorin Mercy 10/26/16)   Consulted and Agree with Plan of Care Patient      Patient will benefit from skilled therapeutic intervention in order to improve the following deficits and impairments:  Pain, Decreased activity tolerance, Decreased range of motion, Decreased strength, Increased edema  Visit Diagnosis: Acute pain of left knee  Stiffness of left knee, not elsewhere classified  Localized edema     Problem List Patient Active Problem List   Diagnosis Date Noted  . Hyperlipidemia 09/04/2014  . Thrombocytopenia ([place])   . Precordial chest pain 04/05/2014  . Coronary atherosclerosis of native coronary artery 10/01/2013  . Other and unspecified hyperlipidemia 10/01/2013  . Essential hypertension, benign 10/01/2013  . Insulin dependent type 2 diabetes mellitus ([place]) 10/01/2013  . Esophageal reflux 10/01/2013  . Hypertrophy of prostate without urinary obstruction and other lower urinary tract symptoms (LUTS) 10/01/2013    RAMSEUR,CHRIS , PTA 10/19/2016, 9:29 AM  [facility] [place], [place], [address] Phone: [phone]   Fax:  [phone]  Name: Keith Espinoza MRN: [id number] Date of Birth: 1941-03-29

## 2016-10-21 ENCOUNTER — Encounter: Payer: Medicare Other | Admitting: *Deleted

## 2016-10-25 ENCOUNTER — Ambulatory Visit: Payer: Medicare Other | Admitting: Physical Therapy

## 2016-10-25 ENCOUNTER — Encounter: Payer: Self-pay | Admitting: Physical Therapy

## 2016-10-25 DIAGNOSIS — M25662 Stiffness of left knee, not elsewhere classified: Secondary | ICD-10-CM | POA: Diagnosis not present

## 2016-10-25 DIAGNOSIS — M25562 Pain in left knee: Secondary | ICD-10-CM | POA: Diagnosis not present

## 2016-10-25 DIAGNOSIS — R600 Localized edema: Secondary | ICD-10-CM

## 2016-10-25 NOTE — Therapy (Signed)
[facility] [place], [place], [address] Phone: [phone]   Fax:  [phone]  Physical Therapy Treatment  Patient Details  Name: Keith Espinoza MRN: [id number] Date of Birth: 1941-04-04 Referring Provider: [name].  Encounter Date: 10/25/2016      PT End of Session - 10/25/16 0832    Visit Number 19   Number of Visits 24   Date for PT Re-Evaluation 11/04/16   PT Start Time 0815   PT Stop Time 0913   PT Time Calculation (min) 58 min   Activity Tolerance Patient tolerated treatment well   Behavior During Therapy [facility] for tasks assessed/performed      Past Medical History:  Diagnosis Date  . Allergic rhinitis   . Allergic rhinitis   . Arthritis   . Chronic leg pain    right  . Chronic lower back pain   . Coronary artery disease    a. Stenting to RCA 2004; staged DES to LAD and Cx 2004. DES to mRCA 2012. b. DES to mCx, PTCA to dCx 11/2011. c. Lateral wall MI 2013 s/p PTCA to distal Cx & DES to mid OM2 11/2011. d. Low risk nuc 04/2014, EF wnl.  . Diabetes mellitus    Insulin dependent  . Diabetic neuropathy (HCC)    MILD  . Diverticulosis   . Dysrhythmia   . Gilbert syndrome   . Gout    right wrist; right foot; right elbow; have had it since 1970's  . H/O hiatal hernia   . Heart murmur   . History of echocardiogram    aortic sclerosis per echo 12/09 EF 65%, otherwise normal  . History of hemorrhoids    BLEEDING  . Hypertension    Diagnosed 1995   . Pancreatic pseudocyst    a. s/p remote drainage 2006.  Marland Kitchen Thrombocytopenia ([place])    Seen on oldest labs in system from 2004  . Vitamin B 12 deficiency    orally replaced    Past Surgical History:  Procedure Laterality Date  . BACK SURGERY     "total of 3 times" S/P fall   . CARPAL TUNNEL RELEASE Bilateral   . CHOLECYSTECTOMY  1990's  . COLONOSCOPY    . CORONARY ANGIOPLASTY  11/11/11  . CORONARY ANGIOPLASTY WITH STENT PLACEMENT  09/30/2011   "1 then;  makes a total of 4"  . CORONARY ANGIOPLASTY WITH STENT PLACEMENT  11/11/11   "1; makes a total of 5"  . INGUINAL HERNIA REPAIR  2003   right  . JOINT REPLACEMENT Right 04/03/2002   hip replacment  . KNEE ARTHROSCOPY  1990's   left  . LEFT HEART CATHETERIZATION WITH CORONARY ANGIOGRAM N/A 09/30/2011   Procedure: LEFT HEART CATHETERIZATION WITH CORONARY ANGIOGRAM;  Surgeon: Jettie Booze, MD;  Location: [facility] CATH LAB;  Service: Cardiovascular;  Laterality: N/A;  possible PCI  . LEFT HEART CATHETERIZATION WITH CORONARY ANGIOGRAM N/A 11/15/2011   Procedure: LEFT HEART CATHETERIZATION WITH CORONARY ANGIOGRAM;  Surgeon: Jettie Booze, MD;  Location: [facility] CATH LAB;  Service: Cardiovascular;  Laterality: N/A;  . PERCUTANEOUS CORONARY STENT INTERVENTION (PCI-S)  09/30/2011   Procedure: PERCUTANEOUS CORONARY STENT INTERVENTION (PCI-S);  Surgeon: Jettie Booze, MD;  Location: [facility] CATH LAB;  Service: Cardiovascular;;  . PERCUTANEOUS CORONARY STENT INTERVENTION (PCI-S) N/A 11/11/2011   Procedure: PERCUTANEOUS CORONARY STENT INTERVENTION (PCI-S);  Surgeon: Jettie Booze, MD;  Location: [facility] CATH LAB;  Service: Cardiovascular;  Laterality: N/A;  . SHOULDER SURGERY Right  X 2  . TONSILLECTOMY  ~ 1948  . TOTAL KNEE ARTHROPLASTY Left 07/23/2016   Procedure: LEFT TOTAL KNEE ARTHROPLASTY;  Surgeon: Marybelle Killings, MD;  Location: [place];  Service: Orthopedics;  Laterality: Left;    There were no vitals filed for this visit.      Subjective Assessment - 10/25/16 0818    Subjective Patient reported some increased stiffness and soreness in left knee and left hip today   Limitations Standing;Walking   Patient Stated Goals Get back to normal.   Currently in Pain? Yes   Pain Score 5    Pain Location Knee   Pain Orientation Left   Pain Descriptors / Indicators Sore;Tightness;Aching   Pain Type Surgical pain   Pain Onset More than a month ago   Pain Frequency Intermittent   Aggravating Factors   ROM and any prolong activity   Pain Relieving Factors at rest            [facility] PT Assessment - 10/25/16 0001      AROM   AROM Assessment Site Knee   Right/Left Knee Left   Left Knee Extension -15   Left Knee Flexion 92     PROM   PROM Assessment Site Knee   Right/Left Knee Left   Left Knee Extension -10   Left Knee Flexion 99                     [facility] Adult PT Treatment/Exercise - 10/25/16 0001      Knee/Hip Exercises: Stretches   Active Hamstring Stretch Left;3 reps;30 seconds     Knee/Hip Exercises: Aerobic   Nustep 60min L6 UE/LE monitored for progression     Knee/Hip Exercises: Machines for Strengthening   Cybex Knee Extension 10# 3x10 reps   Cybex Knee Flexion 30# 3x10 reps B LEs     Knee/Hip Exercises: Standing   Forward Step Up Left;3 sets;10 reps;Hand Hold: 2;Step Height: 8"     Vasopneumatic   Number Minutes Vasopneumatic  15 minutes   Vasopnuematic Location  Knee   Vasopneumatic Pressure Medium     Manual Therapy   Manual Therapy Passive ROM   Soft tissue mobilization gentle manual STW to medial left knee to decrease pain and tightness   Passive ROM PROM of L knee into flexion/extension with holds at end range with focus on Extension                  PT Short Term Goals - 10/25/16 0857      PT SHORT TERM GOAL #1   Title Independent with an initial HEP.   Time 4   Period Weeks   Status Achieved     PT SHORT TERM GOAL #2   Title Active left knee extension= -5 degrees to help normalize gait pattern.   Time 4   Period Weeks   Status On-going  AROM -15 degrees 10/25/16           PT Long Term Goals - 10/25/16 0857      PT LONG TERM GOAL #1   Title Independent with an advanced HEP.   Time 8   Period Weeks   Status On-going     PT LONG TERM GOAL #2   Title Active left knee flexion to 115 degrees+ so the patient can perform functional tasks and do so with pain not > 2-3/10.   Time 8   Period Weeks   Status  On-going  AROM 92 degrees  10/25/16     PT LONG TERM GOAL #3   Title Perform a reciprocating stair gait with one railing with pain not > 2-3/10.   Period Weeks   Status On-going               Plan - 10/25/16 [id number]    Clinical Impression Statement Patient tolerated treatment fairly well with reported soreness in left hip and left medial knee and ongoing tightness in left knee. Patient continues to have reported difficulty bending left knee. Patient improved knee flexion slightly after manual stretching. Patient reported doing more self stretches. Patient goals ongoing due to ROM and pain deficits.   Rehab Potential Excellent   PT Frequency 3x / week   PT Duration 8 weeks   PT Treatment/Interventions ADLs/Self Care Home Management;Cryotherapy;Electrical Stimulation;Therapeutic activities;Therapeutic exercise;Patient/family education;Passive range of motion;Manual techniques;Vasopneumatic Device   PT Next Visit Plan cont with POC for manual and therex to improve ROM, modalities as needed (MD. Lorin Mercy 10/26/16)   Consulted and Agree with Plan of Care Patient      Patient will benefit from skilled therapeutic intervention in order to improve the following deficits and impairments:  Pain, Decreased activity tolerance, Decreased range of motion, Decreased strength, Increased edema  Visit Diagnosis: Acute pain of left knee  Stiffness of left knee, not elsewhere classified  Localized edema     Problem List Patient Active Problem List   Diagnosis Date Noted  . Hyperlipidemia 09/04/2014  . Thrombocytopenia ([place])   . Precordial chest pain 04/05/2014  . Coronary atherosclerosis of native coronary artery 10/01/2013  . Other and unspecified hyperlipidemia 10/01/2013  . Essential hypertension, benign 10/01/2013  . Insulin dependent type 2 diabetes mellitus ([place]) 10/01/2013  . Esophageal reflux 10/01/2013  . Hypertrophy of prostate without urinary obstruction and other lower urinary tract  symptoms (LUTS) 10/01/2013    Ladean Raya, PTA 10/25/16 12:59 PM Mali Applegate MPT [facility] [address] [place], [place], [address] Phone: [phone]   Fax:  [phone]  Name: Keith Espinoza MRN: [id number] Date of Birth: 1940/10/28

## 2016-10-26 ENCOUNTER — Ambulatory Visit (INDEPENDENT_AMBULATORY_CARE_PROVIDER_SITE_OTHER): Payer: Medicare Other

## 2016-10-26 ENCOUNTER — Encounter (INDEPENDENT_AMBULATORY_CARE_PROVIDER_SITE_OTHER): Payer: Self-pay | Admitting: Orthopaedic Surgery

## 2016-10-26 ENCOUNTER — Ambulatory Visit (INDEPENDENT_AMBULATORY_CARE_PROVIDER_SITE_OTHER): Payer: Medicare Other | Admitting: Orthopaedic Surgery

## 2016-10-26 VITALS — BP 114/75 | HR 91 | Ht 70.0 in | Wt 190.0 lb

## 2016-10-26 DIAGNOSIS — M1712 Unilateral primary osteoarthritis, left knee: Secondary | ICD-10-CM

## 2016-10-26 DIAGNOSIS — I25118 Atherosclerotic heart disease of native coronary artery with other forms of angina pectoris: Secondary | ICD-10-CM | POA: Diagnosis not present

## 2016-10-26 DIAGNOSIS — D81819 Biotin-dependent carboxylase deficiency, unspecified: Secondary | ICD-10-CM | POA: Diagnosis not present

## 2016-10-26 DIAGNOSIS — M25551 Pain in right hip: Secondary | ICD-10-CM

## 2016-10-26 DIAGNOSIS — E559 Vitamin D deficiency, unspecified: Secondary | ICD-10-CM | POA: Diagnosis not present

## 2016-10-26 DIAGNOSIS — E784 Other hyperlipidemia: Secondary | ICD-10-CM | POA: Diagnosis not present

## 2016-10-26 DIAGNOSIS — Z96652 Presence of left artificial knee joint: Secondary | ICD-10-CM | POA: Insufficient documentation

## 2016-10-26 DIAGNOSIS — E1342 Other specified diabetes mellitus with diabetic polyneuropathy: Secondary | ICD-10-CM | POA: Diagnosis not present

## 2016-10-26 DIAGNOSIS — I10 Essential (primary) hypertension: Secondary | ICD-10-CM | POA: Diagnosis not present

## 2016-10-26 DIAGNOSIS — Z79899 Other long term (current) drug therapy: Secondary | ICD-10-CM | POA: Diagnosis not present

## 2016-10-26 DIAGNOSIS — E1140 Type 2 diabetes mellitus with diabetic neuropathy, unspecified: Secondary | ICD-10-CM | POA: Diagnosis not present

## 2016-10-26 DIAGNOSIS — N400 Enlarged prostate without lower urinary tract symptoms: Secondary | ICD-10-CM | POA: Diagnosis not present

## 2016-10-26 DIAGNOSIS — K219 Gastro-esophageal reflux disease without esophagitis: Secondary | ICD-10-CM | POA: Diagnosis not present

## 2016-10-26 DIAGNOSIS — M109 Gout, unspecified: Secondary | ICD-10-CM | POA: Diagnosis not present

## 2016-10-26 DIAGNOSIS — M7061 Trochanteric bursitis, right hip: Secondary | ICD-10-CM

## 2016-10-26 HISTORY — DX: Presence of left artificial knee joint: Z96.652

## 2016-10-26 NOTE — Progress Notes (Signed)
Office Visit Note   Patient: Keith Espinoza           Date of Birth: 26-Jan-1941           MRN: [id number] Visit Date: 10/26/2016              Requested by: [name], MD [address] [place], [place] [address] PCP: Henrine Screws, MD   Assessment & Plan: Visit Diagnoses:  1. Pain in right hip   2. Unilateral primary osteoarthritis, left knee   3. Trochanteric bursitis, right hip   4. S/P total knee arthroplasty, left     Plan: To get past 90. We gave him multiple exercises to work on for both extension and flexion. Recheck 4 weeks.  Follow-Up Instructions: Return in about 4 weeks (around 11/23/2016).   Orders:  Orders Placed This Encounter  Procedures  . XR HIP UNILAT W OR W/O PELVIS 2-3 VIEWS RIGHT  . XR Knee 1-2 Views Left   No orders of the defined types were placed in this encounter.     Procedures: No procedures performed   Clinical Data: No additional findings.   Subjective: Chief Complaint  Patient presents with  . Right Hip - Pain  . Left Knee - Routine Post Op    Patient is here for one month return office visit status post left total knee arthroplasty 07/23/2016. He has continued physical therapy. He states that he is doing ok. He does say that the physical therapist pushed his leg back to bend his knee further about three weeks ago and has had pain ever since. He requests x-rays today.  Patient also has complaint of feeling like his right hip is "trying to pop out". This has happened on two separate occasions. He noticed it when trying to get up out of the car.  He had a previous total hip replacement on the right in 2003 by Dr. Jenean Lindau.     Review of Systems   Objective: Vital Signs: BP 114/75   Pulse 91   Ht 5\' 10"  (1.778 m)   Wt 190 lb (86.2 kg)   BMI 27.26 kg/m   Physical Exam  Ortho Exam  Specialty Comments:  No specialty comments available.  Imaging: Xr Hip Unilat W Or W/o Pelvis 2-3 Views Right  Result  Date: 10/26/2016 X-ray right hip shows good alignment and seating of his components. Impression status post right total hip replacement 2003. Maintains satisfactory alignment and seating of his components.  Xr Knee 1-2 Views Left  Result Date: 10/26/2016 X-ray left knee shows good alignment and seating of his prosthesis. Impression satisfactory alignment after total knee replacement    PMFS History: Patient Active Problem List   Diagnosis Date Noted  . S/P revision of total knee, left 10/26/2016  . Hyperlipidemia 09/04/2014  . Thrombocytopenia ([place])   . Precordial chest pain 04/05/2014  . Coronary atherosclerosis of native coronary artery 10/01/2013  . Other and unspecified hyperlipidemia 10/01/2013  . Essential hypertension, benign 10/01/2013  . Insulin dependent type 2 diabetes mellitus ([place]) 10/01/2013  . Esophageal reflux 10/01/2013  . Hypertrophy of prostate without urinary obstruction and other lower urinary tract symptoms (LUTS) 10/01/2013   Past Medical History:  Diagnosis Date  . Allergic rhinitis   . Allergic rhinitis   . Arthritis   . Chronic leg pain    right  . Chronic lower back pain   . Coronary artery disease    a. Stenting to RCA 2004; staged  DES to LAD and Cx 2004. DES to mRCA 2012. b. DES to mCx, PTCA to dCx 11/2011. c. Lateral wall MI 2013 s/p PTCA to distal Cx & DES to mid OM2 11/2011. d. Low risk nuc 04/2014, EF wnl.  . Diabetes mellitus    Insulin dependent  . Diabetic neuropathy (HCC)    MILD  . Diverticulosis   . Dysrhythmia   . Gilbert syndrome   . Gout    right wrist; right foot; right elbow; have had it since 1970's  . H/O hiatal hernia   . Heart murmur   . History of echocardiogram    aortic sclerosis per echo 12/09 EF 65%, otherwise normal  . History of hemorrhoids    BLEEDING  . Hypertension    Diagnosed 1995   . Pancreatic pseudocyst    a. s/p remote drainage 2006.  Marland Kitchen Thrombocytopenia ([place])    Seen on oldest labs in system from 2004  .  Vitamin B 12 deficiency    orally replaced    Family History  Problem Relation Age of Onset  . Diabetes Mother   . Hyperlipidemia Mother   . Hypertension Mother   . Cancer Father   . Hypertension Father   . Diabetes Sister   . Hypertension Sister   . Cancer Brother   . Heart attack Neg Hx     Past Surgical History:  Procedure Laterality Date  . BACK SURGERY     "total of 3 times" S/P fall   . CARPAL TUNNEL RELEASE Bilateral   . CHOLECYSTECTOMY  1990's  . COLONOSCOPY    . CORONARY ANGIOPLASTY  11/11/11  . CORONARY ANGIOPLASTY WITH STENT PLACEMENT  09/30/2011   "1 then; makes a total of 4"  . CORONARY ANGIOPLASTY WITH STENT PLACEMENT  11/11/11   "1; makes a total of 5"  . INGUINAL HERNIA REPAIR  2003   right  . JOINT REPLACEMENT Right 04/03/2002   hip replacment  . KNEE ARTHROSCOPY  1990's   left  . LEFT HEART CATHETERIZATION WITH CORONARY ANGIOGRAM N/A 09/30/2011   Procedure: LEFT HEART CATHETERIZATION WITH CORONARY ANGIOGRAM;  Surgeon: Jettie Booze, MD;  Location: [facility] CATH LAB;  Service: Cardiovascular;  Laterality: N/A;  possible PCI  . LEFT HEART CATHETERIZATION WITH CORONARY ANGIOGRAM N/A 11/15/2011   Procedure: LEFT HEART CATHETERIZATION WITH CORONARY ANGIOGRAM;  Surgeon: Jettie Booze, MD;  Location: [facility] CATH LAB;  Service: Cardiovascular;  Laterality: N/A;  . PERCUTANEOUS CORONARY STENT INTERVENTION (PCI-S)  09/30/2011   Procedure: PERCUTANEOUS CORONARY STENT INTERVENTION (PCI-S);  Surgeon: Jettie Booze, MD;  Location: [facility] CATH LAB;  Service: Cardiovascular;;  . PERCUTANEOUS CORONARY STENT INTERVENTION (PCI-S) N/A 11/11/2011   Procedure: PERCUTANEOUS CORONARY STENT INTERVENTION (PCI-S);  Surgeon: Jettie Booze, MD;  Location: [facility] CATH LAB;  Service: Cardiovascular;  Laterality: N/A;  . SHOULDER SURGERY Right    X 2  . TONSILLECTOMY  ~ 1948  . TOTAL KNEE ARTHROPLASTY Left 07/23/2016   Procedure: LEFT TOTAL KNEE ARTHROPLASTY;  Surgeon: Marybelle Killings, MD;   Location: [place];  Service: Orthopedics;  Laterality: Left;   Social History   Occupational History  . Retired    Social History Main Topics  . Smoking status: Former Research scientist (life sciences)  . Smokeless tobacco: Current User    Types: Chew  . Alcohol use No  . Drug use: No  . Sexual activity: Not on file

## 2016-10-28 ENCOUNTER — Encounter: Payer: Self-pay | Admitting: Physical Therapy

## 2016-10-28 ENCOUNTER — Ambulatory Visit: Payer: Medicare Other | Admitting: Physical Therapy

## 2016-10-28 DIAGNOSIS — M25562 Pain in left knee: Secondary | ICD-10-CM

## 2016-10-28 DIAGNOSIS — M25662 Stiffness of left knee, not elsewhere classified: Secondary | ICD-10-CM

## 2016-10-28 DIAGNOSIS — R600 Localized edema: Secondary | ICD-10-CM

## 2016-10-28 NOTE — Therapy (Signed)
[facility] [place], [place], [address] Phone: [phone]   Fax:  [phone]  Physical Therapy Treatment  Patient Details  Name: Keith Espinoza MRN: [id number] Date of Birth: 29-Jan-1941 Referring Provider: [name].  Encounter Date: 10/28/2016      PT End of Session - 10/28/16 0856    Visit Number 20   Number of Visits 24   Date for PT Re-Evaluation 11/04/16   PT Start Time 0813   PT Stop Time 0912   PT Time Calculation (min) 59 min   Activity Tolerance Patient tolerated treatment well   Behavior During Therapy [facility] for tasks assessed/performed      Past Medical History:  Diagnosis Date  . Allergic rhinitis   . Allergic rhinitis   . Arthritis   . Chronic leg pain    right  . Chronic lower back pain   . Coronary artery disease    a. Stenting to RCA 2004; staged DES to LAD and Cx 2004. DES to mRCA 2012. b. DES to mCx, PTCA to dCx 11/2011. c. Lateral wall MI 2013 s/p PTCA to distal Cx & DES to mid OM2 11/2011. d. Low risk nuc 04/2014, EF wnl.  . Diabetes mellitus    Insulin dependent  . Diabetic neuropathy (HCC)    MILD  . Diverticulosis   . Dysrhythmia   . Gilbert syndrome   . Gout    right wrist; right foot; right elbow; have had it since 1970's  . H/O hiatal hernia   . Heart murmur   . History of echocardiogram    aortic sclerosis per echo 12/09 EF 65%, otherwise normal  . History of hemorrhoids    BLEEDING  . Hypertension    Diagnosed 1995   . Pancreatic pseudocyst    a. s/p remote drainage 2006.  Marland Kitchen Thrombocytopenia ([place])    Seen on oldest labs in system from 2004  . Vitamin B 12 deficiency    orally replaced    Past Surgical History:  Procedure Laterality Date  . BACK SURGERY     "total of 3 times" S/P fall   . CARPAL TUNNEL RELEASE Bilateral   . CHOLECYSTECTOMY  1990's  . COLONOSCOPY    . CORONARY ANGIOPLASTY  11/11/11  . CORONARY ANGIOPLASTY WITH STENT PLACEMENT  09/30/2011   "1 then;  makes a total of 4"  . CORONARY ANGIOPLASTY WITH STENT PLACEMENT  11/11/11   "1; makes a total of 5"  . INGUINAL HERNIA REPAIR  2003   right  . JOINT REPLACEMENT Right 04/03/2002   hip replacment  . KNEE ARTHROSCOPY  1990's   left  . LEFT HEART CATHETERIZATION WITH CORONARY ANGIOGRAM N/A 09/30/2011   Procedure: LEFT HEART CATHETERIZATION WITH CORONARY ANGIOGRAM;  Surgeon: Jettie Booze, MD;  Location: [facility] CATH LAB;  Service: Cardiovascular;  Laterality: N/A;  possible PCI  . LEFT HEART CATHETERIZATION WITH CORONARY ANGIOGRAM N/A 11/15/2011   Procedure: LEFT HEART CATHETERIZATION WITH CORONARY ANGIOGRAM;  Surgeon: Jettie Booze, MD;  Location: [facility] CATH LAB;  Service: Cardiovascular;  Laterality: N/A;  . PERCUTANEOUS CORONARY STENT INTERVENTION (PCI-S)  09/30/2011   Procedure: PERCUTANEOUS CORONARY STENT INTERVENTION (PCI-S);  Surgeon: Jettie Booze, MD;  Location: [facility] CATH LAB;  Service: Cardiovascular;;  . PERCUTANEOUS CORONARY STENT INTERVENTION (PCI-S) N/A 11/11/2011   Procedure: PERCUTANEOUS CORONARY STENT INTERVENTION (PCI-S);  Surgeon: Jettie Booze, MD;  Location[facility] CATH LAB;  Service: Cardiovascular;  Laterality: N/A;  . SHOULDER SURGERY Right  X 2  . TONSILLECTOMY  ~ 1948  . TOTAL KNEE ARTHROPLASTY Left 07/23/2016   Procedure: LEFT TOTAL KNEE ARTHROPLASTY;  Surgeon: Marybelle Killings, MD;  Location: [place];  Service: Orthopedics;  Laterality: Left;    There were no vitals filed for this visit.      Subjective Assessment - 10/28/16 0821    Subjective Patient went to MD and he reported one more month of therapy and focus on stretching knee, may have manipulation if no improvement   Limitations Standing;Walking   Patient Stated Goals Get back to normal.   Currently in Pain? Yes   Pain Score 4    Pain Location Knee   Pain Orientation Left   Pain Descriptors / Indicators Tightness;Sore   Pain Type Surgical pain   Pain Onset More than a month ago   Pain Frequency  Intermittent   Aggravating Factors  ROM   Pain Relieving Factors at rest            [facility] PT Assessment - 10/28/16 0001      AROM   AROM Assessment Site Knee   Right/Left Knee Left   Left Knee Extension -15   Left Knee Flexion 94     PROM   PROM Assessment Site Knee   Right/Left Knee Left   Left Knee Extension -10   Left Knee Flexion 102                     [facility] Adult PT Treatment/Exercise - 10/28/16 0001      Knee/Hip Exercises: Stretches   Knee: Self-Stretch to increase Flexion Left;3 reps;30 seconds     Knee/Hip Exercises: Aerobic   Nustep 24min L4 adjusted to seat 7-8 for ROM     Vasopneumatic   Number Minutes Vasopneumatic  15 minutes   Vasopnuematic Location  Knee   Vasopneumatic Pressure Medium     Manual Therapy   Manual Therapy Passive ROM   Soft tissue mobilization IASTM to left knee to decrease pain and tightness/scar tissue    Passive ROM PROM of L knee into flexion/extension with holds at end range                  PT Short Term Goals - 10/25/16 0857      PT SHORT TERM GOAL #1   Title Independent with an initial HEP.   Time 4   Period Weeks   Status Achieved     PT SHORT TERM GOAL #2   Title Active left knee extension= -5 degrees to help normalize gait pattern.   Time 4   Period Weeks   Status On-going  AROM -15 degrees 10/25/16           PT Long Term Goals - 10/25/16 0857      PT LONG TERM GOAL #1   Title Independent with an advanced HEP.   Time 8   Period Weeks   Status On-going     PT LONG TERM GOAL #2   Title Active left knee flexion to 115 degrees+ so the patient can perform functional tasks and do so with pain not > 2-3/10.   Time 8   Period Weeks   Status On-going  AROM 92 degrees 10/25/16     PT LONG TERM GOAL #3   Title Perform a reciprocating stair gait with one railing with pain not > 2-3/10.   Period Weeks   Status On-going  Plan - 10/28/16 0857    Clinical  Impression Statement Patient tolerated treatment fairly well overall, yet has increased pain with knee flexion stretching due to scar tissue. Today focused on ROM in knee per MD orders. Educated patint on self stretching several times a day with techniques. Today patient was able to improve left knee flexion ROM after manual stretching. Patients knee ext continues to remain at -10 degrees passively. Patient goals ongoing due to ROM deficts.   Rehab Potential Excellent   PT Frequency 3x / week   PT Duration 8 weeks   PT Treatment/Interventions ADLs/Self Care Home Management;Cryotherapy;Electrical Stimulation;Therapeutic activities;Therapeutic exercise;Patient/family education;Passive range of motion;Manual techniques;Vasopneumatic Device   PT Next Visit Plan cont with POC focus on ROM per MD (MD. Lorin Mercy 11/23/16)   Consulted and Agree with Plan of Care Patient      Patient will benefit from skilled therapeutic intervention in order to improve the following deficits and impairments:  Pain, Decreased activity tolerance, Decreased range of motion, Decreased strength, Increased edema  Visit Diagnosis: Acute pain of left knee  Stiffness of left knee, not elsewhere classified  Localized edema     Problem List Patient Active Problem List   Diagnosis Date Noted  . S/P revision of total knee, left 10/26/2016  . Hyperlipidemia 09/04/2014  . Thrombocytopenia ([place])   . Precordial chest pain 04/05/2014  . Coronary atherosclerosis of native coronary artery 10/01/2013  . Other and unspecified hyperlipidemia 10/01/2013  . Essential hypertension, benign 10/01/2013  . Insulin dependent type 2 diabetes mellitus ([place]) 10/01/2013  . Esophageal reflux 10/01/2013  . Hypertrophy of prostate without urinary obstruction and other lower urinary tract symptoms (LUTS) 10/01/2013    Ladean Raya, PTA 10/28/16 9:13 AM  [facility] [place],  [place], [address] Phone: [phone]   Fax:  [phone]  Name: OBEDIAH MCCAMBRIDGE MRN: [id number] Date of Birth: [birth year]

## 2016-11-01 ENCOUNTER — Ambulatory Visit: Payer: Medicare Other | Admitting: Physical Therapy

## 2016-11-01 ENCOUNTER — Encounter: Payer: Self-pay | Admitting: Physical Therapy

## 2016-11-01 DIAGNOSIS — M25662 Stiffness of left knee, not elsewhere classified: Secondary | ICD-10-CM | POA: Diagnosis not present

## 2016-11-01 DIAGNOSIS — M25562 Pain in left knee: Secondary | ICD-10-CM | POA: Diagnosis not present

## 2016-11-01 DIAGNOSIS — R600 Localized edema: Secondary | ICD-10-CM

## 2016-11-01 NOTE — Therapy (Signed)
[facility] [place], [place], [address] Phone: [phone]   Fax:  [phone]  Physical Therapy Treatment  Patient Details  Name: Keith Espinoza MRN: [id number] Date of Birth: 11/04/1940 Referring Provider: [name].  Encounter Date: 11/01/2016      PT End of Session - 11/01/16 0854    Visit Number 21   Number of Visits 24   Date for PT Re-Evaluation 11/04/16   PT Start Time 0814   PT Stop Time 0912   PT Time Calculation (min) 58 min   Activity Tolerance Patient tolerated treatment well   Behavior During Therapy [facility] for tasks assessed/performed      Past Medical History:  Diagnosis Date  . Allergic rhinitis   . Allergic rhinitis   . Arthritis   . Chronic leg pain    right  . Chronic lower back pain   . Coronary artery disease    a. Stenting to RCA 2004; staged DES to LAD and Cx 2004. DES to mRCA 2012. b. DES to mCx, PTCA to dCx 11/2011. c. Lateral wall MI 2013 s/p PTCA to distal Cx & DES to mid OM2 11/2011. d. Low risk nuc 04/2014, EF wnl.  . Diabetes mellitus    Insulin dependent  . Diabetic neuropathy (HCC)    MILD  . Diverticulosis   . Dysrhythmia   . Gilbert syndrome   . Gout    right wrist; right foot; right elbow; have had it since 1970's  . H/O hiatal hernia   . Heart murmur   . History of echocardiogram    aortic sclerosis per echo 12/09 EF 65%, otherwise normal  . History of hemorrhoids    BLEEDING  . Hypertension    Diagnosed 1995   . Pancreatic pseudocyst    a. s/p remote drainage 2006.  Marland Kitchen Thrombocytopenia ([place])    Seen on oldest labs in system from 2004  . Vitamin B 12 deficiency    orally replaced    Past Surgical History:  Procedure Laterality Date  . BACK SURGERY     "total of 3 times" S/P fall   . CARPAL TUNNEL RELEASE Bilateral   . CHOLECYSTECTOMY  1990's  . COLONOSCOPY    . CORONARY ANGIOPLASTY  11/11/11  . CORONARY ANGIOPLASTY WITH STENT PLACEMENT  09/30/2011   "1 then;  makes a total of 4"  . CORONARY ANGIOPLASTY WITH STENT PLACEMENT  11/11/11   "1; makes a total of 5"  . INGUINAL HERNIA REPAIR  2003   right  . JOINT REPLACEMENT Right 04/03/2002   hip replacment  . KNEE ARTHROSCOPY  1990's   left  . LEFT HEART CATHETERIZATION WITH CORONARY ANGIOGRAM N/A 09/30/2011   Procedure: LEFT HEART CATHETERIZATION WITH CORONARY ANGIOGRAM;  Surgeon: Jettie Booze, MD;  Location: [facility] CATH LAB;  Service: Cardiovascular;  Laterality: N/A;  possible PCI  . LEFT HEART CATHETERIZATION WITH CORONARY ANGIOGRAM N/A 11/15/2011   Procedure: LEFT HEART CATHETERIZATION WITH CORONARY ANGIOGRAM;  Surgeon: Jettie Booze, MD;  Location: [facility] CATH LAB;  Service: Cardiovascular;  Laterality: N/A;  . PERCUTANEOUS CORONARY STENT INTERVENTION (PCI-S)  09/30/2011   Procedure: PERCUTANEOUS CORONARY STENT INTERVENTION (PCI-S);  Surgeon: Jettie Booze, MD;  Location: [facility] CATH LAB;  Service: Cardiovascular;;  . PERCUTANEOUS CORONARY STENT INTERVENTION (PCI-S) N/A 11/11/2011   Procedure: PERCUTANEOUS CORONARY STENT INTERVENTION (PCI-S);  Surgeon: Jettie Booze, MD;  Location: [facility] CATH LAB;  Service: Cardiovascular;  Laterality: N/A;  . SHOULDER SURGERY Right  X 2  . TONSILLECTOMY  ~ 1948  . TOTAL KNEE ARTHROPLASTY Left 07/23/2016   Procedure: LEFT TOTAL KNEE ARTHROPLASTY;  Surgeon: Marybelle Killings, MD;  Location: [place];  Service: Orthopedics;  Laterality: Left;    There were no vitals filed for this visit.      Subjective Assessment - 11/01/16 0816    Subjective Patient reported that he spent a lot of time over weekend self stretching knee yet today he can't tell the difference dur to ongoing tightness in knee   Limitations Standing;Walking   Patient Stated Goals Get back to normal.   Currently in Pain? Yes   Pain Score 4    Pain Location Knee   Pain Orientation Left   Pain Descriptors / Indicators Sore;Tightness   Pain Type Surgical pain   Pain Onset More than a month ago    Pain Frequency Intermittent   Aggravating Factors  ROM   Pain Relieving Factors at rest            [facility] PT Assessment - 11/01/16 0001      AROM   AROM Assessment Site Knee   Right/Left Knee Left   Left Knee Extension -14   Left Knee Flexion 95     PROM   PROM Assessment Site Knee   Right/Left Knee Left   Left Knee Extension -8   Left Knee Flexion 105                     [facility] Adult PT Treatment/Exercise - 11/01/16 0001      Knee/Hip Exercises: Stretches   Knee: Self-Stretch to increase Flexion Left;3 reps;30 seconds     Knee/Hip Exercises: Aerobic   Stationary Bike 78min adusted for RPM     Vasopneumatic   Number Minutes Vasopneumatic  15 minutes   Vasopnuematic Location  Knee   Vasopneumatic Pressure Medium     Manual Therapy   Manual Therapy Passive ROM   Soft tissue mobilization IASTM to left knee to decrease pain and tightness/scar tissue    Passive ROM PROM of L knee into flexion/extension with holds at end range                  PT Short Term Goals - 11/01/16 [id number]      PT SHORT TERM GOAL #1   Title Independent with an initial HEP.   Time 4   Period Weeks   Status Achieved     PT SHORT TERM GOAL #2   Title Active left knee extension= -5 degrees to help normalize gait pattern.   Time 4   Period Weeks   Status On-going  AROM -14 degrees 11/01/16           PT Long Term Goals - 11/01/16 0853      PT LONG TERM GOAL #1   Title Independent with an advanced HEP.   Time 8   Period Weeks   Status On-going     PT LONG TERM GOAL #2   Title Active left knee flexion to 115 degrees+ so the patient can perform functional tasks and do so with pain not > 2-3/10.   Time 8   Period Weeks   Status On-going  AROM 95 degrees 11/01/16     PT LONG TERM GOAL #3   Title Perform a reciprocating stair gait with one railing with pain not > 2-3/10.   Time 8   Period Weeks   Status On-going  Plan - 11/01/16 0858     Clinical Impression Statement Patient progressing with improved ROM today for left knee flexion and ext both activie and passively. Patient tolerated treatment well overall with some pain increased with ROM. Patient has reported doing self stretching and still has ongoing tightness in knee. ROM goals ongoing due to deficts.   Rehab Potential Excellent   PT Frequency 3x / week   PT Duration 8 weeks   PT Treatment/Interventions ADLs/Self Care Home Management;Cryotherapy;Electrical Stimulation;Therapeutic activities;Therapeutic exercise;Patient/family education;Passive range of motion;Manual techniques;Vasopneumatic Device   PT Next Visit Plan cont with POC focus on ROM per MD (MD. Lorin Mercy 11/23/16)   Consulted and Agree with Plan of Care Patient      Patient will benefit from skilled therapeutic intervention in order to improve the following deficits and impairments:  Pain, Decreased activity tolerance, Decreased range of motion, Decreased strength, Increased edema  Visit Diagnosis: Acute pain of left knee  Stiffness of left knee, not elsewhere classified  Localized edema     Problem List Patient Active Problem List   Diagnosis Date Noted  . S/P revision of total knee, left 10/26/2016  . Hyperlipidemia 09/04/2014  . Thrombocytopenia ([place])   . Precordial chest pain 04/05/2014  . Coronary atherosclerosis of native coronary artery 10/01/2013  . Other and unspecified hyperlipidemia 10/01/2013  . Essential hypertension, benign 10/01/2013  . Insulin dependent type 2 diabetes mellitus ([place]) 10/01/2013  . Esophageal reflux 10/01/2013  . Hypertrophy of prostate without urinary obstruction and other lower urinary tract symptoms (LUTS) 10/01/2013    DUNFORD, CHRISTINA P, PTA 11/01/2016, 9:13 AM  [facility] [place], [place], [address] Phone: [phone]   Fax:  [phone]  Name: Keith Espinoza MRN: [id number] Date of Birth:  1941-04-28

## 2016-11-02 DIAGNOSIS — E538 Deficiency of other specified B group vitamins: Secondary | ICD-10-CM | POA: Diagnosis not present

## 2016-11-04 ENCOUNTER — Ambulatory Visit: Payer: Medicare Other | Attending: Orthopaedic Surgery | Admitting: *Deleted

## 2016-11-04 DIAGNOSIS — M25662 Stiffness of left knee, not elsewhere classified: Secondary | ICD-10-CM

## 2016-11-04 DIAGNOSIS — M25562 Pain in left knee: Secondary | ICD-10-CM

## 2016-11-04 DIAGNOSIS — R600 Localized edema: Secondary | ICD-10-CM

## 2016-11-04 NOTE — Therapy (Signed)
[facility] [place], [place], [address] Phone: [phone]   Fax:  [phone]  Physical Therapy Treatment  Patient Details  Name: Keith Espinoza MRN: [id number] Date of Birth: Sep 19, 1941 Referring Provider: [name].  Encounter Date: 11/04/2016      PT End of Session - 11/04/16 0840    Visit Number 22   Number of Visits 24   Date for PT Re-Evaluation 11/04/16   Authorization Type MD on 11-23-16   PT Start Time 0820   PT Stop Time 0920   PT Time Calculation (min) 60 min      Past Medical History:  Diagnosis Date  . Allergic rhinitis   . Allergic rhinitis   . Arthritis   . Chronic leg pain    right  . Chronic lower back pain   . Coronary artery disease    a. Stenting to RCA 2004; staged DES to LAD and Cx 2004. DES to mRCA 2012. b. DES to mCx, PTCA to dCx 11/2011. c. Lateral wall MI 2013 s/p PTCA to distal Cx & DES to mid OM2 11/2011. d. Low risk nuc 04/2014, EF wnl.  . Diabetes mellitus    Insulin dependent  . Diabetic neuropathy (HCC)    MILD  . Diverticulosis   . Dysrhythmia   . Gilbert syndrome   . Gout    right wrist; right foot; right elbow; have had it since 1970's  . H/O hiatal hernia   . Heart murmur   . History of echocardiogram    aortic sclerosis per echo 12/09 EF 65%, otherwise normal  . History of hemorrhoids    BLEEDING  . Hypertension    Diagnosed 1995   . Pancreatic pseudocyst    a. s/p remote drainage 2006.  Marland Kitchen Thrombocytopenia ([place])    Seen on oldest labs in system from 2004  . Vitamin B 12 deficiency    orally replaced    Past Surgical History:  Procedure Laterality Date  . BACK SURGERY     "total of 3 times" S/P fall   . CARPAL TUNNEL RELEASE Bilateral   . CHOLECYSTECTOMY  1990's  . COLONOSCOPY    . CORONARY ANGIOPLASTY  11/11/11  . CORONARY ANGIOPLASTY WITH STENT PLACEMENT  09/30/2011   "1 then; makes a total of 4"  . CORONARY ANGIOPLASTY WITH STENT PLACEMENT  11/11/11   "1;  makes a total of 5"  . INGUINAL HERNIA REPAIR  2003   right  . JOINT REPLACEMENT Right 04/03/2002   hip replacment  . KNEE ARTHROSCOPY  1990's   left  . LEFT HEART CATHETERIZATION WITH CORONARY ANGIOGRAM N/A 09/30/2011   Procedure: LEFT HEART CATHETERIZATION WITH CORONARY ANGIOGRAM;  Surgeon: Jettie Booze, MD;  Location: [facility] CATH LAB;  Service: Cardiovascular;  Laterality: N/A;  possible PCI  . LEFT HEART CATHETERIZATION WITH CORONARY ANGIOGRAM N/A 11/15/2011   Procedure: LEFT HEART CATHETERIZATION WITH CORONARY ANGIOGRAM;  Surgeon: Jettie Booze, MD;  Location: [facility] CATH LAB;  Service: Cardiovascular;  Laterality: N/A;  . PERCUTANEOUS CORONARY STENT INTERVENTION (PCI-S)  09/30/2011   Procedure: PERCUTANEOUS CORONARY STENT INTERVENTION (PCI-S);  Surgeon: Jettie Booze, MD;  Location: [facility] CATH LAB;  Service: Cardiovascular;;  . PERCUTANEOUS CORONARY STENT INTERVENTION (PCI-S) N/A 11/11/2011   Procedure: PERCUTANEOUS CORONARY STENT INTERVENTION (PCI-S);  Surgeon: Jettie Booze, MD;  Location: [facility] CATH LAB;  Service: Cardiovascular;  Laterality: N/A;  . SHOULDER SURGERY Right    X 2  . TONSILLECTOMY  ~  [place] Left 07/23/2016   Procedure: LEFT TOTAL KNEE ARTHROPLASTY;  Surgeon: Marybelle Killings, MD;  [place];  Service: Orthopedics;  Laterality: Left;    There were no vitals filed for this visit.      Subjective Assessment - 11/04/16 0837    Subjective Patient reported that he spent a lot of time over weekend self stretching knee yet today he can't tell the difference dur to ongoing tightness in knee. Using 7#s on knee to straighten   Limitations Standing;Walking   Patient Stated Goals Get back to normal.   Currently in Pain? Yes   Pain Score 4    Pain Location Knee   Pain Orientation Left   Pain Descriptors / Indicators Sore   Pain Type Surgical pain   Pain Onset More than a month ago   Pain Frequency Intermittent                          [facility] Adult PT Treatment/Exercise - 11/04/16 0001      Exercises   Exercises Knee/Hip     Knee/Hip Exercises: Aerobic   Stationary Bike x 18 min L1     Knee/Hip Exercises: Standing   Forward Lunges Left;3 sets;10 reps  14 in step     Vasopneumatic   Number Minutes Vasopneumatic  15 minutes   Vasopnuematic Location  Knee   Vasopneumatic Pressure Medium   Vasopneumatic Temperature  38     Manual Therapy   Manual Therapy Passive ROM   Passive ROM PROM of L knee into flexion/extension withLong  holds at end range with focus on extension                  PT Short Term Goals - 11/01/16 [id number]      PT SHORT TERM GOAL #1   Title Independent with an initial HEP.   Time 4   Period Weeks   Status Achieved     PT SHORT TERM GOAL #2   Title Active left knee extension= -5 degrees to help normalize gait pattern.   Time 4   Period Weeks   Status On-going  AROM -14 degrees 11/01/16           PT Long Term Goals - 11/01/16 0853      PT LONG TERM GOAL #1   Title Independent with an advanced HEP.   Time 8   Period Weeks   Status On-going     PT LONG TERM GOAL #2   Title Active left knee flexion to 115 degrees+ so the patient can perform functional tasks and do so with pain not > 2-3/10.   Time 8   Period Weeks   Status On-going  AROM 95 degrees 11/01/16     PT LONG TERM GOAL #3   Title Perform a reciprocating stair gait with one railing with pain not > 2-3/10.   Time 8   Period Weeks   Status On-going               Plan - 11/04/16 [id number]    Clinical Impression Statement Pt did fairly well today with Rx. We focused mainly on ROM. We reached 100 degrees flexion and  -8 in extension   Rehab Potential Excellent   PT Frequency 3x / week   PT Duration 8 weeks   PT Treatment/Interventions ADLs/Self Care Home Management;Cryotherapy;Electrical Stimulation;Therapeutic activities;Therapeutic exercise;Patient/family  education;Passive range of motion;Manual techniques;Vasopneumatic Device  PT Next Visit Plan cont with POC focus on ROM per MD (MD. Lorin Mercy 11/23/16)   Consulted and Agree with Plan of Care Patient      Patient will benefit from skilled therapeutic intervention in order to improve the following deficits and impairments:  Pain, Decreased activity tolerance, Decreased range of motion, Decreased strength, Increased edema  Visit Diagnosis: Acute pain of left knee  Stiffness of left knee, not elsewhere classified  Localized edema     Problem List Patient Active Problem List   Diagnosis Date Noted  . S/P revision of total knee, left 10/26/2016  . Hyperlipidemia 09/04/2014  . Thrombocytopenia ([place])   . Precordial chest pain 04/05/2014  . Coronary atherosclerosis of native coronary artery 10/01/2013  . Other and unspecified hyperlipidemia 10/01/2013  . Essential hypertension, benign 10/01/2013  . Insulin dependent type 2 diabetes mellitus ([place]) 10/01/2013  . Esophageal reflux 10/01/2013  . Hypertrophy of prostate without urinary obstruction and other lower urinary tract symptoms (LUTS) 10/01/2013    RAMSEUR,CHRIS, PTA 11/04/2016, 9:20 AM  [facility] [place], [place], [address] Phone: [phone]   Fax:  [phone]  Name: Keith Espinoza MRN: [id number] Date of Birth: 03-Jun-1941

## 2016-11-08 ENCOUNTER — Ambulatory Visit: Payer: Medicare Other | Admitting: Physical Therapy

## 2016-11-08 ENCOUNTER — Encounter: Payer: Self-pay | Admitting: Physical Therapy

## 2016-11-08 DIAGNOSIS — M25662 Stiffness of left knee, not elsewhere classified: Secondary | ICD-10-CM

## 2016-11-08 DIAGNOSIS — M25562 Pain in left knee: Secondary | ICD-10-CM

## 2016-11-08 DIAGNOSIS — R600 Localized edema: Secondary | ICD-10-CM | POA: Diagnosis not present

## 2016-11-08 NOTE — Therapy (Signed)
[facility] [place], [place], [address] Phone: [phone]   Fax:  [phone]  Physical Therapy Treatment  Patient Details  Name: Keith Espinoza MRN: [id number] Date of Birth: 1941-08-05 Referring Provider: [name].  Encounter Date: 11/08/2016      PT End of Session - 11/08/16 0819    Visit Number 23   Number of Visits 24   Date for PT Re-Evaluation 11/04/16   Authorization Type MD on 11-23-16   PT Start Time 0817   PT Stop Time 0905   PT Time Calculation (min) 48 min   Activity Tolerance Patient tolerated treatment well   Behavior During Therapy [facility] for tasks assessed/performed      Past Medical History:  Diagnosis Date  . Allergic rhinitis   . Allergic rhinitis   . Arthritis   . Chronic leg pain    right  . Chronic lower back pain   . Coronary artery disease    a. Stenting to RCA 2004; staged DES to LAD and Cx 2004. DES to mRCA 2012. b. DES to mCx, PTCA to dCx 11/2011. c. Lateral wall MI 2013 s/p PTCA to distal Cx & DES to mid OM2 11/2011. d. Low risk nuc 04/2014, EF wnl.  . Diabetes mellitus    Insulin dependent  . Diabetic neuropathy (HCC)    MILD  . Diverticulosis   . Dysrhythmia   . Gilbert syndrome   . Gout    right wrist; right foot; right elbow; have had it since 1970's  . H/O hiatal hernia   . Heart murmur   . History of echocardiogram    aortic sclerosis per echo 12/09 EF 65%, otherwise normal  . History of hemorrhoids    BLEEDING  . Hypertension    Diagnosed 1995   . Pancreatic pseudocyst    a. s/p remote drainage 2006.  Marland Kitchen Thrombocytopenia ([place])    Seen on oldest labs in system from 2004  . Vitamin B 12 deficiency    orally replaced    Past Surgical History:  Procedure Laterality Date  . BACK SURGERY     "total of 3 times" S/P fall   . CARPAL TUNNEL RELEASE Bilateral   . CHOLECYSTECTOMY  1990's  . COLONOSCOPY    . CORONARY ANGIOPLASTY  11/11/11  . CORONARY ANGIOPLASTY WITH STENT  PLACEMENT  09/30/2011   "1 then; makes a total of 4"  . CORONARY ANGIOPLASTY WITH STENT PLACEMENT  11/11/11   "1; makes a total of 5"  . INGUINAL HERNIA REPAIR  2003   right  . JOINT REPLACEMENT Right 04/03/2002   hip replacment  . KNEE ARTHROSCOPY  1990's   left  . LEFT HEART CATHETERIZATION WITH CORONARY ANGIOGRAM N/A 09/30/2011   Procedure: LEFT HEART CATHETERIZATION WITH CORONARY ANGIOGRAM;  Surgeon: Jettie Booze, MD;  Location: [facility] CATH LAB;  Service: Cardiovascular;  Laterality: N/A;  possible PCI  . LEFT HEART CATHETERIZATION WITH CORONARY ANGIOGRAM N/A 11/15/2011   Procedure: LEFT HEART CATHETERIZATION WITH CORONARY ANGIOGRAM;  Surgeon: Jettie Booze, MD;  Location: [facility] CATH LAB;  Service: Cardiovascular;  Laterality: N/A;  . PERCUTANEOUS CORONARY STENT INTERVENTION (PCI-S)  09/30/2011   Procedure: PERCUTANEOUS CORONARY STENT INTERVENTION (PCI-S);  Surgeon: Jettie Booze, MD;  Location: [facility] CATH LAB;  Service: Cardiovascular;;  . PERCUTANEOUS CORONARY STENT INTERVENTION (PCI-S) N/A 11/11/2011   Procedure: PERCUTANEOUS CORONARY STENT INTERVENTION (PCI-S);  Surgeon: Jettie Booze, MD;  Location: [facility] CATH LAB;  Service: Cardiovascular;  Laterality: N/A;  . SHOULDER SURGERY Right    X 2  . TONSILLECTOMY  ~ 1948  . TOTAL KNEE ARTHROPLASTY Left 07/23/2016   Procedure: LEFT TOTAL KNEE ARTHROPLASTY;  Surgeon: Marybelle Killings, MD;  Location: [place];  Service: Orthopedics;  Laterality: Left;    There were no vitals filed for this visit.      Subjective Assessment - 11/08/16 0818    Subjective Reports using 7# ankleweight over the weekend.   Limitations Standing;Walking   Patient Stated Goals Get back to normal.   Currently in Pain? Yes   Pain Score 4    Pain Location Knee   Pain Orientation Left   Pain Descriptors / Indicators Sore;Discomfort  soreness under L patella per patient report   Pain Type Surgical pain   Pain Onset More than a month ago             [facility] PT Assessment - 11/08/16 0001      Assessment   Medical Diagnosis Left total knee replacement.   Onset Date/Surgical Date 07/31/16   Next MD Visit 11/23/2016     Restrictions   Weight Bearing Restrictions No     ROM / Strength   AROM / PROM / Strength AROM     AROM   Overall AROM  Deficits   AROM Assessment Site Knee   Right/Left Knee Left   Left Knee Extension -7   Left Knee Flexion 101                     [facility] Adult PT Treatment/Exercise - 11/08/16 0001      Knee/Hip Exercises: Aerobic   Stationary Bike L1 x11 min, seat 11     Knee/Hip Exercises: Machines for Strengthening   Cybex Knee Extension 10# 3x10 reps   Cybex Knee Flexion 30# 3x10 reps BLEs     Modalities   Modalities Electrical Stimulation;Vasopneumatic     Electrical Stimulation   Electrical Stimulation Location L knee   Electrical Stimulation Action IFC   Electrical Stimulation Parameters 1-10 hz x15 min   Electrical Stimulation Goals Pain     Vasopneumatic   Number Minutes Vasopneumatic  15 minutes   Vasopnuematic Location  Knee   Vasopneumatic Pressure Medium   Vasopneumatic Temperature  61     Manual Therapy   Manual Therapy Joint mobilization;Soft tissue mobilization;Passive ROM   Joint Mobilization L tibiofemoral joint mobilizations A/P to increase ROM   Soft tissue mobilization L patella mobilizations in med/lat, sup/inf to emphasize proper mobility   Passive ROM PROM of L knee into flexion in sitting, extension in supine to promote proper ROM                  PT Short Term Goals - 11/08/16 [id number]      PT SHORT TERM GOAL #1   Title Independent with an initial HEP.   Time 4   Period Weeks   Status Achieved     PT SHORT TERM GOAL #2   Title Active left knee extension= -5 degrees to help normalize gait pattern.   Time 4   Period Weeks   Status On-going  AROM -7 degrees 11/08/16           PT Long Term Goals - 11/08/16 0904      PT LONG TERM GOAL #1    Title Independent with an advanced HEP.   Time 8   Period Weeks   Status Achieved  PT LONG TERM GOAL #2   Title Active left knee flexion to 115 degrees+ so the patient can perform functional tasks and do so with pain not > 2-3/10.   Time 8   Period Weeks   Status On-going  AROM 101 degrees 11/08/16     PT LONG TERM GOAL #3   Title Perform a reciprocating stair gait with one railing with pain not > 2-3/10.   Time 8   Period Weeks   Status On-going               Plan - 11/08/16 0857    Clinical Impression Statement Patient tolerated today's treatment fairly well as he arrived with L knee soreness and discomfort. Patient initially had difficulty with revolutions on stationary bike but as seated was moved back and initial stiffness decreased patient able to complete better revolutions without discomfort. Tibiofemoral joint mobilizations completed today in sitting in hopes of improving L knee ROM. Good L patella mobility noted in supine in all directions although very slight limitation in lateral glide. L knee resting in 8 deg of flexion with AROM measurements taken as 7-101 deg. Normal modalities response noted following removal of the modalities.   Rehab Potential Excellent   PT Frequency 3x / week   PT Duration 8 weeks   PT Treatment/Interventions ADLs/Self Care Home Management;Cryotherapy;Electrical Stimulation;Therapeutic activities;Therapeutic exercise;Patient/family education;Passive range of motion;Manual techniques;Vasopneumatic Device   PT Next Visit Plan cont with POC focus on ROM per MD (MD. Lorin Mercy 11/23/16)   Consulted and Agree with Plan of Care Patient      Patient will benefit from skilled therapeutic intervention in order to improve the following deficits and impairments:  Pain, Decreased activity tolerance, Decreased range of motion, Decreased strength, Increased edema  Visit Diagnosis: Acute pain of left knee  Stiffness of left knee, not elsewhere  classified  Localized edema     Problem List Patient Active Problem List   Diagnosis Date Noted  . S/P revision of total knee, left 10/26/2016  . Hyperlipidemia 09/04/2014  . Thrombocytopenia ([place])   . Precordial chest pain 04/05/2014  . Coronary atherosclerosis of native coronary artery 10/01/2013  . Other and unspecified hyperlipidemia 10/01/2013  . Essential hypertension, benign 10/01/2013  . Insulin dependent type 2 diabetes mellitus ([place]) 10/01/2013  . Esophageal reflux 10/01/2013  . Hypertrophy of prostate without urinary obstruction and other lower urinary tract symptoms (LUTS) 10/01/2013    Wynelle Fanny, PTA 11/08/2016, 9:08 AM  [facility] [place], [place], [address] Phone: [phone]   Fax:  [phone]  Name: Keith Espinoza MRN: [id number] Date of Birth: 04/12/1941

## 2016-11-09 ENCOUNTER — Other Ambulatory Visit: Payer: Self-pay | Admitting: Interventional Cardiology

## 2016-11-09 DIAGNOSIS — D81818 Other biotin-dependent carboxylase deficiency: Secondary | ICD-10-CM | POA: Diagnosis not present

## 2016-11-11 ENCOUNTER — Ambulatory Visit: Payer: Medicare Other | Admitting: Physical Therapy

## 2016-11-11 ENCOUNTER — Encounter: Payer: Self-pay | Admitting: Physical Therapy

## 2016-11-11 DIAGNOSIS — M25662 Stiffness of left knee, not elsewhere classified: Secondary | ICD-10-CM

## 2016-11-11 DIAGNOSIS — M25562 Pain in left knee: Secondary | ICD-10-CM

## 2016-11-11 DIAGNOSIS — R600 Localized edema: Secondary | ICD-10-CM

## 2016-11-11 NOTE — Therapy (Signed)
[facility] [place], [place], [address] Phone: [phone]   Fax:  [phone]  Physical Therapy Treatment  Patient Details  Name: DAZ FABIEN MRN: [id number] Date of Birth: 11/01/1940 Referring Provider: [name].  Encounter Date: 11/11/2016      PT End of Session - 11/11/16 0850    Visit Number 24   Date for PT Re-Evaluation 11/04/16   Authorization Type MD on 11-23-16   PT Start Time 0814   PT Stop Time 0909   PT Time Calculation (min) 55 min   Activity Tolerance Patient tolerated treatment well   Behavior During Therapy [facility] for tasks assessed/performed      Past Medical History:  Diagnosis Date  . Allergic rhinitis   . Allergic rhinitis   . Arthritis   . Chronic leg pain    right  . Chronic lower back pain   . Coronary artery disease    a. Stenting to RCA 2004; staged DES to LAD and Cx 2004. DES to mRCA 2012. b. DES to mCx, PTCA to dCx 11/2011. c. Lateral wall MI 2013 s/p PTCA to distal Cx & DES to mid OM2 11/2011. d. Low risk nuc 04/2014, EF wnl.  . Diabetes mellitus    Insulin dependent  . Diabetic neuropathy (HCC)    MILD  . Diverticulosis   . Dysrhythmia   . Gilbert syndrome   . Gout    right wrist; right foot; right elbow; have had it since 1970's  . H/O hiatal hernia   . Heart murmur   . History of echocardiogram    aortic sclerosis per echo 12/09 EF 65%, otherwise normal  . History of hemorrhoids    BLEEDING  . Hypertension    Diagnosed 1995   . Pancreatic pseudocyst    a. s/p remote drainage 2006.  Marland Kitchen Thrombocytopenia ([place])    Seen on oldest labs in system from 2004  . Vitamin B 12 deficiency    orally replaced    Past Surgical History:  Procedure Laterality Date  . BACK SURGERY     "total of 3 times" S/P fall   . CARPAL TUNNEL RELEASE Bilateral   . CHOLECYSTECTOMY  1990's  . COLONOSCOPY    . CORONARY ANGIOPLASTY  11/11/11  . CORONARY ANGIOPLASTY WITH STENT PLACEMENT  09/30/2011    "1 then; makes a total of 4"  . CORONARY ANGIOPLASTY WITH STENT PLACEMENT  11/11/11   "1; makes a total of 5"  . INGUINAL HERNIA REPAIR  2003   right  . JOINT REPLACEMENT Right 04/03/2002   hip replacment  . KNEE ARTHROSCOPY  1990's   left  . LEFT HEART CATHETERIZATION WITH CORONARY ANGIOGRAM N/A 09/30/2011   Procedure: LEFT HEART CATHETERIZATION WITH CORONARY ANGIOGRAM;  Surgeon: Jettie Booze, MD;  Location: [facility] CATH LAB;  Service: Cardiovascular;  Laterality: N/A;  possible PCI  . LEFT HEART CATHETERIZATION WITH CORONARY ANGIOGRAM N/A 11/15/2011   Procedure: LEFT HEART CATHETERIZATION WITH CORONARY ANGIOGRAM;  Surgeon: Jettie Booze, MD;  Location: [facility] CATH LAB;  Service: Cardiovascular;  Laterality: N/A;  . PERCUTANEOUS CORONARY STENT INTERVENTION (PCI-S)  09/30/2011   Procedure: PERCUTANEOUS CORONARY STENT INTERVENTION (PCI-S);  Surgeon: Jettie Booze, MD;  Location: [facility] CATH LAB;  Service: Cardiovascular;;  . PERCUTANEOUS CORONARY STENT INTERVENTION (PCI-S) N/A 11/11/2011   Procedure: PERCUTANEOUS CORONARY STENT INTERVENTION (PCI-S);  Surgeon: Jettie Booze, MD;  Location: [facility] CATH LAB;  Service: Cardiovascular;  Laterality: N/A;  . SHOULDER SURGERY  Right    X 2  . TONSILLECTOMY  ~ 1948  . TOTAL KNEE ARTHROPLASTY Left 07/23/2016   Procedure: LEFT TOTAL KNEE ARTHROPLASTY;  Surgeon: Marybelle Killings, MD;  Location: [place];  Service: Orthopedics;  Laterality: Left;    There were no vitals filed for this visit.      Subjective Assessment - 11/11/16 0823    Subjective Patient arrived with complaints of his knee very stiff knee today   Limitations Standing;Walking   Patient Stated Goals Get back to normal.   Currently in Pain? Yes   Pain Score 5    Pain Location Knee   Pain Orientation Left   Pain Descriptors / Indicators Sore   Pain Type Surgical pain   Pain Onset More than a month ago   Pain Frequency Intermittent   Aggravating Factors  ROM in knee   Pain  Relieving Factors at rest            [facility] PT Assessment - 11/11/16 0001      AROM   AROM Assessment Site Knee   Right/Left Knee Left   Left Knee Extension -15   Left Knee Flexion 91     PROM   PROM Assessment Site Knee   Right/Left Knee Left   Left Knee Extension -9   Left Knee Flexion 98                     [facility] Adult PT Treatment/Exercise - 11/11/16 0001      Knee/Hip Exercises: Aerobic   Stationary Bike 44min L1 adjusted for ROM     Electrical Stimulation   Electrical Stimulation Location L knee   Electrical Stimulation Action IFC   Electrical Stimulation Parameters 1-10hz  x43min   Electrical Stimulation Goals Pain     Vasopneumatic   Number Minutes Vasopneumatic  15 minutes   Vasopnuematic Location  Knee   Vasopneumatic Pressure Medium     Manual Therapy   Manual Therapy Passive ROM   Soft tissue mobilization L patella mobilizations in med/lat, sup/inf to emphasize proper mobility   Passive ROM PROM for left knee for flexion and ext with low load holds                  PT Short Term Goals - 11/08/16 [id number]      PT SHORT TERM GOAL #1   Title Independent with an initial HEP.   Time 4   Period Weeks   Status Achieved     PT SHORT TERM GOAL #2   Title Active left knee extension= -5 degrees to help normalize gait pattern.   Time 4   Period Weeks   Status On-going  AROM -7 degrees 11/08/16           PT Long Term Goals - 11/08/16 0904      PT LONG TERM GOAL #1   Title Independent with an advanced HEP.   Time 8   Period Weeks   Status Achieved     PT LONG TERM GOAL #2   Title Active left knee flexion to 115 degrees+ so the patient can perform functional tasks and do so with pain not > 2-3/10.   Time 8   Period Weeks   Status On-going  AROM 101 degrees 11/08/16     PT LONG TERM GOAL #3   Title Perform a reciprocating stair gait with one railing with pain not > 2-3/10.   Time 8   Period Weeks  Status On-going                Plan - 11/11/16 [id number]    Clinical Impression Statement Patient tolerated treatment well overall. Focused on ROM in knee today. Patient continues to have stiffness in left knee and increased pain with any ROM in knee. Patient has not improved ROM today. Patient continues to perform self stretches daily. Patient unable to meet remaining goals due to ROM deficts.   Rehab Potential Excellent   PT Frequency 3x / week   PT Duration 8 weeks   PT Treatment/Interventions ADLs/Self Care Home Management;Cryotherapy;Electrical Stimulation;Therapeutic activities;Therapeutic exercise;Patient/family education;Passive range of motion;Manual techniques;Vasopneumatic Device   PT Next Visit Plan cont with POC focus on ROM per MD (MD. Lorin Mercy 11/23/16)   Consulted and Agree with Plan of Care Patient      Patient will benefit from skilled therapeutic intervention in order to improve the following deficits and impairments:  Pain, Decreased activity tolerance, Decreased range of motion, Decreased strength, Increased edema  Visit Diagnosis: Acute pain of left knee  Stiffness of left knee, not elsewhere classified  Localized edema     Problem List Patient Active Problem List   Diagnosis Date Noted  . S/P revision of total knee, left 10/26/2016  . Hyperlipidemia 09/04/2014  . Thrombocytopenia ([place])   . Precordial chest pain 04/05/2014  . Coronary atherosclerosis of native coronary artery 10/01/2013  . Other and unspecified hyperlipidemia 10/01/2013  . Essential hypertension, benign 10/01/2013  . Insulin dependent type 2 diabetes mellitus ([place]) 10/01/2013  . Esophageal reflux 10/01/2013  . Hypertrophy of prostate without urinary obstruction and other lower urinary tract symptoms (LUTS) 10/01/2013    DUNFORD, CHRISTINA P, PTA 11/11/2016, 9:10 AM  [facility] [place], [place], [address] Phone: [phone]   Fax:   [phone]  Name: SHAMONE LUCATERO MRN: [id number] Date of Birth: December 07, 1940

## 2016-11-15 ENCOUNTER — Encounter: Payer: Self-pay | Admitting: Physical Therapy

## 2016-11-15 ENCOUNTER — Ambulatory Visit: Payer: Medicare Other | Admitting: Physical Therapy

## 2016-11-15 DIAGNOSIS — M25562 Pain in left knee: Secondary | ICD-10-CM

## 2016-11-15 DIAGNOSIS — M25662 Stiffness of left knee, not elsewhere classified: Secondary | ICD-10-CM

## 2016-11-15 DIAGNOSIS — R600 Localized edema: Secondary | ICD-10-CM | POA: Diagnosis not present

## 2016-11-15 NOTE — Therapy (Signed)
[facility] [place], [place], [address] Phone: [phone]   Fax:  [phone]  Physical Therapy Treatment  Patient Details  Name: Keith Espinoza MRN: [id number] Date of Birth: May 25, 1941 Referring Provider: [name].  Encounter Date: 11/15/2016      PT End of Session - 11/15/16 0857    Visit Number 25   Date for PT Re-Evaluation 11/04/16   Authorization Type MD on 11-23-16   PT Start Time 0813   PT Stop Time 0913   PT Time Calculation (min) 60 min   Activity Tolerance Patient tolerated treatment well   Behavior During Therapy [facility] for tasks assessed/performed      Past Medical History:  Diagnosis Date  . Allergic rhinitis   . Allergic rhinitis   . Arthritis   . Chronic leg pain    right  . Chronic lower back pain   . Coronary artery disease    a. Stenting to RCA 2004; staged DES to LAD and Cx 2004. DES to mRCA 2012. b. DES to mCx, PTCA to dCx 11/2011. c. Lateral wall MI 2013 s/p PTCA to distal Cx & DES to mid OM2 11/2011. d. Low risk nuc 04/2014, EF wnl.  . Diabetes mellitus    Insulin dependent  . Diabetic neuropathy (HCC)    MILD  . Diverticulosis   . Dysrhythmia   . Gilbert syndrome   . Gout    right wrist; right foot; right elbow; have had it since 1970's  . H/O hiatal hernia   . Heart murmur   . History of echocardiogram    aortic sclerosis per echo 12/09 EF 65%, otherwise normal  . History of hemorrhoids    BLEEDING  . Hypertension    Diagnosed 1995   . Pancreatic pseudocyst    a. s/p remote drainage 2006.  Marland Kitchen Thrombocytopenia ([place])    Seen on oldest labs in system from 2004  . Vitamin B 12 deficiency    orally replaced    Past Surgical History:  Procedure Laterality Date  . BACK SURGERY     "total of 3 times" S/P fall   . CARPAL TUNNEL RELEASE Bilateral   . CHOLECYSTECTOMY  1990's  . COLONOSCOPY    . CORONARY ANGIOPLASTY  11/11/11  . CORONARY ANGIOPLASTY WITH STENT PLACEMENT  09/30/2011    "1 then; makes a total of 4"  . CORONARY ANGIOPLASTY WITH STENT PLACEMENT  11/11/11   "1; makes a total of 5"  . INGUINAL HERNIA REPAIR  2003   right  . JOINT REPLACEMENT Right 04/03/2002   hip replacment  . KNEE ARTHROSCOPY  1990's   left  . LEFT HEART CATHETERIZATION WITH CORONARY ANGIOGRAM N/A 09/30/2011   Procedure: LEFT HEART CATHETERIZATION WITH CORONARY ANGIOGRAM;  Surgeon: Jettie Booze, MD;  Location: [facility] CATH LAB;  Service: Cardiovascular;  Laterality: N/A;  possible PCI  . LEFT HEART CATHETERIZATION WITH CORONARY ANGIOGRAM N/A 11/15/2011   Procedure: LEFT HEART CATHETERIZATION WITH CORONARY ANGIOGRAM;  Surgeon: Jettie Booze, MD;  Location: [facility] CATH LAB;  Service: Cardiovascular;  Laterality: N/A;  . PERCUTANEOUS CORONARY STENT INTERVENTION (PCI-S)  09/30/2011   Procedure: PERCUTANEOUS CORONARY STENT INTERVENTION (PCI-S);  Surgeon: Jettie Booze, MD;  Location: [facility] CATH LAB;  Service: Cardiovascular;;  . PERCUTANEOUS CORONARY STENT INTERVENTION (PCI-S) N/A 11/11/2011   Procedure: PERCUTANEOUS CORONARY STENT INTERVENTION (PCI-S);  Surgeon: Jettie Booze, MD;  Location: [facility] CATH LAB;  Service: Cardiovascular;  Laterality: N/A;  . SHOULDER SURGERY  Right    X 2  . TONSILLECTOMY  ~ 1948  . TOTAL KNEE ARTHROPLASTY Left 07/23/2016   Procedure: LEFT TOTAL KNEE ARTHROPLASTY;  Surgeon: Marybelle Killings, MD;  Location: [employer];  Service: Orthopedics;  Laterality: Left;    There were no vitals filed for this visit.      Subjective Assessment - 11/15/16 0826    Subjective Patient has complaints of ongoing stiffness in knee   Limitations Standing;Walking   Patient Stated Goals Get back to normal.   Currently in Pain? Yes   Pain Score 4    Pain Location Knee   Pain Orientation Left   Pain Descriptors / Indicators Sore   Pain Type Surgical pain   Pain Onset More than a month ago   Pain Frequency Intermittent   Aggravating Factors  ROM in knee   Pain Relieving Factors at  knee            [facility] PT Assessment - 11/15/16 0001      AROM   AROM Assessment Site Knee   Right/Left Knee Left   Left Knee Extension -13   Left Knee Flexion 94     PROM   PROM Assessment Site Knee   Right/Left Knee Left   Left Knee Extension -7   Left Knee Flexion 103                     [facility] Adult PT Treatment/Exercise - 11/15/16 0001      Knee/Hip Exercises: Aerobic   Stationary Bike 45min adjusted for ROM     Electrical Stimulation   Electrical Stimulation Location L knee   Electrical Stimulation Action IFC   Electrical Stimulation Parameters 1-10hz  x15min   Electrical Stimulation Goals Pain     Vasopneumatic   Number Minutes Vasopneumatic  15 minutes   Vasopnuematic Location  Knee   Vasopneumatic Pressure Medium     Manual Therapy   Manual Therapy Passive ROM   Soft tissue mobilization L patella mobilizations in med/lat, sup/inf to emphasize proper mobility   Passive ROM PROM for left knee for flexion and ext with low load holds                  PT Short Term Goals - 11/08/16 [id number]      PT SHORT TERM GOAL #1   Title Independent with an initial HEP.   Time 4   Period Weeks   Status Achieved     PT SHORT TERM GOAL #2   Title Active left knee extension= -5 degrees to help normalize gait pattern.   Time 4   Period Weeks   Status On-going  AROM -7 degrees 11/08/16           PT Long Term Goals - 11/08/16 0904      PT LONG TERM GOAL #1   Title Independent with an advanced HEP.   Time 8   Period Weeks   Status Achieved     PT LONG TERM GOAL #2   Title Active left knee flexion to 115 degrees+ so the patient can perform functional tasks and do so with pain not > 2-3/10.   Time 8   Period Weeks   Status On-going  AROM 101 degrees 11/08/16     PT LONG TERM GOAL #3   Title Perform a reciprocating stair gait with one railing with pain not > 2-3/10.   Time 8   Period Weeks   Status On-going  Plan -  11/15/16 0900    Clinical Impression Statement Patient tolerated treatment well overall. Patient has ongoing complaints of stiffness and pain with ROM in left knee. Patient was able to increase ROM today. Today focused on ROM in knee for both flexion and ext. Patient progressing toward goals yet ongoing to full ROM deficts.    Rehab Potential Excellent   PT Frequency 3x / week   PT Duration 8 weeks   PT Treatment/Interventions ADLs/Self Care Home Management;Cryotherapy;Electrical Stimulation;Therapeutic activities;Therapeutic exercise;Patient/family education;Passive range of motion;Manual techniques;Vasopneumatic Device   PT Next Visit Plan cont with POC focus on ROM per MD (MD. Lorin Mercy 11/23/16)      Patient will benefit from skilled therapeutic intervention in order to improve the following deficits and impairments:  Pain, Decreased activity tolerance, Decreased range of motion, Decreased strength, Increased edema  Visit Diagnosis: Acute pain of left knee  Stiffness of left knee, not elsewhere classified  Localized edema     Problem List Patient Active Problem List   Diagnosis Date Noted  . S/P revision of total knee, left 10/26/2016  . Hyperlipidemia 09/04/2014  . Thrombocytopenia ([place])   . Precordial chest pain 04/05/2014  . Coronary atherosclerosis of native coronary artery 10/01/2013  . Other and unspecified hyperlipidemia 10/01/2013  . Essential hypertension, benign 10/01/2013  . Insulin dependent type 2 diabetes mellitus ([place]) 10/01/2013  . Esophageal reflux 10/01/2013  . Hypertrophy of prostate without urinary obstruction and other lower urinary tract symptoms (LUTS) 10/01/2013    DUNFORD, CHRISTINA P, PTA 11/15/2016, 9:13 AM  [facility] [place], [place], [address] Phone: [phone]   Fax:  [phone]  Name: Keith Espinoza MRN: [id number] Date of Birth: [birth year]

## 2016-11-18 ENCOUNTER — Ambulatory Visit: Payer: Medicare Other | Admitting: *Deleted

## 2016-11-18 DIAGNOSIS — M25562 Pain in left knee: Secondary | ICD-10-CM

## 2016-11-18 DIAGNOSIS — M25662 Stiffness of left knee, not elsewhere classified: Secondary | ICD-10-CM | POA: Diagnosis not present

## 2016-11-18 DIAGNOSIS — R600 Localized edema: Secondary | ICD-10-CM | POA: Diagnosis not present

## 2016-11-18 NOTE — Therapy (Signed)
[facility] [place], [place], [address] Phone: [phone]   Fax:  [phone]  Physical Therapy Treatment  Patient Details  Name: Keith Espinoza MRN: [id number] Date of Birth: 21-Aug-1941 Referring Provider: [name].  Encounter Date: 11/18/2016      PT End of Session - 11/18/16 1216    Visit Number 26   Number of Visits 24   Date for PT Re-Evaluation 11/04/16   Authorization Type MD on 11-23-16   PT Start Time 1200   PT Stop Time 1250   PT Time Calculation (min) 50 min   Activity Tolerance Patient tolerated treatment well   Behavior During Therapy [facility] for tasks assessed/performed      Past Medical History:  Diagnosis Date  . Allergic rhinitis   . Allergic rhinitis   . Arthritis   . Chronic leg pain    right  . Chronic lower back pain   . Coronary artery disease    a. Stenting to RCA 2004; staged DES to LAD and Cx 2004. DES to mRCA 2012. b. DES to mCx, PTCA to dCx 11/2011. c. Lateral wall MI 2013 s/p PTCA to distal Cx & DES to mid OM2 11/2011. d. Low risk nuc 04/2014, EF wnl.  . Diabetes mellitus    Insulin dependent  . Diabetic neuropathy (HCC)    MILD  . Diverticulosis   . Dysrhythmia   . Gilbert syndrome   . Gout    right wrist; right foot; right elbow; have had it since 1970's  . H/O hiatal hernia   . Heart murmur   . History of echocardiogram    aortic sclerosis per echo 12/09 EF 65%, otherwise normal  . History of hemorrhoids    BLEEDING  . Hypertension    Diagnosed 1995   . Pancreatic pseudocyst    a. s/p remote drainage 2006.  Marland Kitchen Thrombocytopenia ([place])    Seen on oldest labs in system from 2004  . Vitamin B 12 deficiency    orally replaced    Past Surgical History:  Procedure Laterality Date  . BACK SURGERY     "total of 3 times" S/P fall   . CARPAL TUNNEL RELEASE Bilateral   . CHOLECYSTECTOMY  1990's  . COLONOSCOPY    . CORONARY ANGIOPLASTY  11/11/11  . CORONARY ANGIOPLASTY WITH  STENT PLACEMENT  09/30/2011   "1 then; makes a total of 4"  . CORONARY ANGIOPLASTY WITH STENT PLACEMENT  11/11/11   "1; makes a total of 5"  . INGUINAL HERNIA REPAIR  2003   right  . JOINT REPLACEMENT Right 04/03/2002   hip replacment  . KNEE ARTHROSCOPY  1990's   left  . LEFT HEART CATHETERIZATION WITH CORONARY ANGIOGRAM N/A 09/30/2011   Procedure: LEFT HEART CATHETERIZATION WITH CORONARY ANGIOGRAM;  Surgeon: Jettie Booze, MD;  Location: [facility] CATH LAB;  Service: Cardiovascular;  Laterality: N/A;  possible PCI  . LEFT HEART CATHETERIZATION WITH CORONARY ANGIOGRAM N/A 11/15/2011   Procedure: LEFT HEART CATHETERIZATION WITH CORONARY ANGIOGRAM;  Surgeon: Jettie Booze, MD;  Location: [facility] CATH LAB;  Service: Cardiovascular;  Laterality: N/A;  . PERCUTANEOUS CORONARY STENT INTERVENTION (PCI-S)  09/30/2011   Procedure: PERCUTANEOUS CORONARY STENT INTERVENTION (PCI-S);  Surgeon: Jettie Booze, MD;  Location: [facility] CATH LAB;  Service: Cardiovascular;;  . PERCUTANEOUS CORONARY STENT INTERVENTION (PCI-S) N/A 11/11/2011   Procedure: PERCUTANEOUS CORONARY STENT INTERVENTION (PCI-S);  Surgeon: Jettie Booze, MD;  Location: [facility] CATH LAB;  Service: Cardiovascular;  Laterality: N/A;  . SHOULDER SURGERY Right    X 2  . TONSILLECTOMY  ~ 1948  . TOTAL KNEE ARTHROPLASTY Left 07/23/2016   Procedure: LEFT TOTAL KNEE ARTHROPLASTY;  Surgeon: Marybelle Killings, MD;  Location: [place];  Service: Orthopedics;  Laterality: Left;    There were no vitals filed for this visit.      Subjective Assessment - 11/18/16 1215    Subjective Patient has complaints of ongoing stiffness in knee   Limitations Standing;Walking   Patient Stated Goals Get back to normal.   Currently in Pain? Yes   Pain Score 3    Pain Location Knee   Pain Orientation Left   Pain Descriptors / Indicators Sore   Pain Type Surgical pain   Pain Onset More than a month ago                         [facility] Adult PT  Treatment/Exercise - 11/18/16 0001      Knee/Hip Exercises: Aerobic   Stationary Bike 67min adjusted for ROM     Knee/Hip Exercises: Machines for Strengthening   Cybex Knee Flexion 50  # 3x10 reps BLEs     Acupuncturist Location --   Printmaker Goals --     Vasopneumatic   Number Minutes Vasopneumatic  15 minutes   Vasopnuematic Location  Knee   Vasopneumatic Pressure Medium   Vasopneumatic Temperature  38     Manual Therapy   Manual Therapy Passive ROM   Soft tissue mobilization L patella mobilizations in med/lat, sup/inf to emphasize proper mobility   Passive ROM PROM for left knee for flexion and ext with low load holds                  PT Short Term Goals - 11/08/16 [id number]      PT SHORT TERM GOAL #1   Title Independent with an initial HEP.   Time 4   Period Weeks   Status Achieved     PT SHORT TERM GOAL #2   Title Active left knee extension= -5 degrees to help normalize gait pattern.   Time 4   Period Weeks   Status On-going  AROM -7 degrees 11/08/16           PT Long Term Goals - 11/08/16 0904      PT LONG TERM GOAL #1   Title Independent with an advanced HEP.   Time 8   Period Weeks   Status Achieved     PT LONG TERM GOAL #2   Title Active left knee flexion to 115 degrees+ so the patient can perform functional tasks and do so with pain not > 2-3/10.   Time 8   Period Weeks   Status On-going  AROM 101 degrees 11/08/16     PT LONG TERM GOAL #3   Title Perform a reciprocating stair gait with one railing with pain not > 2-3/10.   Time 8   Period Weeks   Status On-going               Plan - 11/18/16 1237    Clinical Impression Statement Pt continues to progress with strength and ROM for RT knee and is having less pain with stretching for Extension, but still has some pain during flexion. LTGsongoing due to deficits   Rehab Potential Excellent   PT Frequency 3x / week   PT Duration 8 weeks  PT Treatment/Interventions ADLs/Self Care Home Management;Cryotherapy;Electrical Stimulation;Therapeutic activities;Therapeutic exercise;Patient/family education;Passive range of motion;Manual techniques;Vasopneumatic Device   PT Next Visit Plan cont with POC focus on ROM per MD (MD. Lorin Mercy 11/23/16)   MD note on Monday   Consulted and Agree with Plan of Care Patient      Patient will benefit from skilled therapeutic intervention in order to improve the following deficits and impairments:  Pain, Decreased activity tolerance, Decreased range of motion, Decreased strength, Increased edema  Visit Diagnosis: Acute pain of left knee  Stiffness of left knee, not elsewhere classified     Problem List Patient Active Problem List   Diagnosis Date Noted  . S/P revision of total knee, left 10/26/2016  . Hyperlipidemia 09/04/2014  . Thrombocytopenia ([place])   . Precordial chest pain 04/05/2014  . Coronary atherosclerosis of native coronary artery 10/01/2013  . Other and unspecified hyperlipidemia 10/01/2013  . Essential hypertension, benign 10/01/2013  . Insulin dependent type 2 diabetes mellitus ([place]) 10/01/2013  . Esophageal reflux 10/01/2013  . Hypertrophy of prostate without urinary obstruction and other lower urinary tract symptoms (LUTS) 10/01/2013    RAMSEUR,CHRIS , PTA 11/18/2016, 12:54 PM  [facility] [address] [place], [place], [address] Phone: [phone]   Fax:  [phone]  Name: MICKEAL DIRIENZO MRN: [id number] Date of Birth: 1941-07-11

## 2016-11-22 ENCOUNTER — Ambulatory Visit: Payer: Medicare Other | Admitting: Physical Therapy

## 2016-11-22 ENCOUNTER — Encounter: Payer: Self-pay | Admitting: Physical Therapy

## 2016-11-22 DIAGNOSIS — M25662 Stiffness of left knee, not elsewhere classified: Secondary | ICD-10-CM

## 2016-11-22 DIAGNOSIS — R600 Localized edema: Secondary | ICD-10-CM | POA: Diagnosis not present

## 2016-11-22 DIAGNOSIS — M25562 Pain in left knee: Secondary | ICD-10-CM

## 2016-11-22 NOTE — Therapy (Addendum)
[facility] [place], [place], [address] Phone: [phone]   Fax:  [phone]  Physical Therapy Treatment  Patient Details  Name: Keith Espinoza MRN: [id number] Date of Birth: August 22, 1941 Referring Provider: [name].  Encounter Date: 11/22/2016      PT End of Session - 11/22/16 0852    Visit Number 27   Date for PT Re-Evaluation 11/04/16   Authorization Type MD on 11-23-16   PT Start Time 0815   PT Stop Time 0911   PT Time Calculation (min) 56 min   Activity Tolerance Patient tolerated treatment well   Behavior During Therapy [facility] for tasks assessed/performed      Past Medical History:  Diagnosis Date  . Allergic rhinitis   . Allergic rhinitis   . Arthritis   . Chronic leg pain    right  . Chronic lower back pain   . Coronary artery disease    a. Stenting to RCA 2004; staged DES to LAD and Cx 2004. DES to mRCA 2012. b. DES to mCx, PTCA to dCx 11/2011. c. Lateral wall MI 2013 s/p PTCA to distal Cx & DES to mid OM2 11/2011. d. Low risk nuc 04/2014, EF wnl.  . Diabetes mellitus    Insulin dependent  . Diabetic neuropathy (HCC)    MILD  . Diverticulosis   . Dysrhythmia   . Gilbert syndrome   . Gout    right wrist; right foot; right elbow; have had it since 1970's  . H/O hiatal hernia   . Heart murmur   . History of echocardiogram    aortic sclerosis per echo 12/09 EF 65%, otherwise normal  . History of hemorrhoids    BLEEDING  . Hypertension    Diagnosed 1995   . Pancreatic pseudocyst    a. s/p remote drainage 2006.  Marland Kitchen Thrombocytopenia ([place])    Seen on oldest labs in system from 2004  . Vitamin B 12 deficiency    orally replaced    Past Surgical History:  Procedure Laterality Date  . BACK SURGERY     "total of 3 times" S/P fall   . CARPAL TUNNEL RELEASE Bilateral   . CHOLECYSTECTOMY  1990's  . COLONOSCOPY    . CORONARY ANGIOPLASTY  11/11/11  . CORONARY ANGIOPLASTY WITH STENT PLACEMENT  09/30/2011    "1 then; makes a total of 4"  . CORONARY ANGIOPLASTY WITH STENT PLACEMENT  11/11/11   "1; makes a total of 5"  . INGUINAL HERNIA REPAIR  2003   right  . JOINT REPLACEMENT Right 04/03/2002   hip replacment  . KNEE ARTHROSCOPY  1990's   left  . LEFT HEART CATHETERIZATION WITH CORONARY ANGIOGRAM N/A 09/30/2011   Procedure: LEFT HEART CATHETERIZATION WITH CORONARY ANGIOGRAM;  Surgeon: Jettie Booze, MD;  Location: [facility] CATH LAB;  Service: Cardiovascular;  Laterality: N/A;  possible PCI  . LEFT HEART CATHETERIZATION WITH CORONARY ANGIOGRAM N/A 11/15/2011   Procedure: LEFT HEART CATHETERIZATION WITH CORONARY ANGIOGRAM;  Surgeon: Jettie Booze, MD;  Location: [facility] CATH LAB;  Service: Cardiovascular;  Laterality: N/A;  . PERCUTANEOUS CORONARY STENT INTERVENTION (PCI-S)  09/30/2011   Procedure: PERCUTANEOUS CORONARY STENT INTERVENTION (PCI-S);  Surgeon: Jettie Booze, MD;  Location: [facility] CATH LAB;  Service: Cardiovascular;;  . PERCUTANEOUS CORONARY STENT INTERVENTION (PCI-S) N/A 11/11/2011   Procedure: PERCUTANEOUS CORONARY STENT INTERVENTION (PCI-S);  Surgeon: Jettie Booze, MD;  Location: [facility] CATH LAB;  Service: Cardiovascular;  Laterality: N/A;  . SHOULDER SURGERY  Right    X 2  . TONSILLECTOMY  ~ 1948  . TOTAL KNEE ARTHROPLASTY Left 07/23/2016   Procedure: LEFT TOTAL KNEE ARTHROPLASTY;  Surgeon: Marybelle Killings, MD;  Location: [place];  Service: Orthopedics;  Laterality: Left;    There were no vitals filed for this visit.      Subjective Assessment - 11/22/16 0820    Subjective Patient arrived with ongoing stiffness complaints   Limitations Standing;Walking   Currently in Pain? Yes   Pain Score 3    Pain Location Knee   Pain Orientation Left   Pain Descriptors / Indicators Sore   Pain Type Surgical pain   Pain Onset More than a month ago   Aggravating Factors  ROM in knee   Pain Relieving Factors at rest            [facility] PT Assessment - 11/22/16 0001      AROM    AROM Assessment Site Knee   Right/Left Knee Left   Left Knee Extension -13   Left Knee Flexion 95     PROM   PROM Assessment Site Knee   Right/Left Knee Left   Left Knee Extension -7   Left Knee Flexion 105                     [facility] Adult PT Treatment/Exercise - 11/22/16 0001      Knee/Hip Exercises: Aerobic   Stationary Bike 78mn adjusted for ROM     Electrical Stimulation   Electrical Stimulation Location L knee   Electrical Stimulation Action IFC   Electrical Stimulation Parameters 1-_0  x186m   Electrical Stimulation Goals Pain     Vasopneumatic   Number Minutes Vasopneumatic  15 minutes   Vasopnuematic Location  Knee   Vasopneumatic Pressure Medium     Manual Therapy   Manual Therapy Passive ROM   Soft tissue mobilization L patella mobilizations in med/lat, sup/inf to emphasize proper mobility   Passive ROM PROM for left knee for flexion and ext with low load holds                  PT Short Term Goals - 11/22/16 083474    PT SHORT TERM GOAL #1   Title Independent with an initial HEP.   Period Weeks   Status Achieved     PT SHORT TERM GOAL #2   Title Active left knee extension= -5 degrees to help normalize gait pattern.   Time 4   Period Weeks   Status On-going  AROM -13 degrees/ PROM -7 degrees 11/22/16           PT Long Term Goals - 11/22/16 0853      PT LONG TERM GOAL #1   Title Independent with an advanced HEP.   Time 8   Period Weeks   Status Achieved     PT LONG TERM GOAL #2   Title Active left knee flexion to 115 degrees+ so the patient can perform functional tasks and do so with pain not > 2-3/10.   Time 8   Period Weeks   Status On-going  AROM 95 degrees/ PROM 105 degrees 11/22/16     PT LONG TERM GOAL #3   Title Perform a reciprocating stair gait with one railing with pain not > 2-3/10.   Time 8   Period Weeks   Status On-going               Plan -  11/22/16 0854    Clinical Impression Statement  Patient tolerated treatment well today. Patient has improved ROM overall active and passively yet unable to meet current goals. Patient is consistent with HEP self stretches and reports ongoing stiffness. Patient able to perform all ADL's with greater ease. Patient has increased pain reported only with flexion stretches currently. Full AROM deficts remain.    Rehab Potential Excellent   PT Frequency 3x / week   PT Duration 8 weeks   PT Treatment/Interventions ADLs/Self Care Home Management;Cryotherapy;Electrical Stimulation;Therapeutic activities;Therapeutic exercise;Patient/family education;Passive range of motion;Manual techniques;Vasopneumatic Device   PT Next Visit Plan cont with POC MD. Lorin Mercy 11/23/16   Consulted and Agree with Plan of Care Patient      Patient will benefit from skilled therapeutic intervention in order to improve the following deficits and impairments:  Pain, Decreased activity tolerance, Decreased range of motion, Decreased strength, Increased edema  Visit Diagnosis: Acute pain of left knee  Stiffness of left knee, not elsewhere classified  Localized edema     Problem List Patient Active Problem List   Diagnosis Date Noted  . S/P revision of total knee, left 10/26/2016  . Hyperlipidemia 09/04/2014  . Thrombocytopenia ([place])   . Precordial chest pain 04/05/2014  . Coronary atherosclerosis of native coronary artery 10/01/2013  . Other and unspecified hyperlipidemia 10/01/2013  . Essential hypertension, benign 10/01/2013  . Insulin dependent type 2 diabetes mellitus ([place]) 10/01/2013  . Esophageal reflux 10/01/2013  . Hypertrophy of prostate without urinary obstruction and other lower urinary tract symptoms (LUTS) 10/01/2013    Ladean Raya, PTA 11/22/16 9:11 AM Mali Applegate MPT [facility] [place], [place], [address] Phone: [phone]   Fax:  [phone]  Name: Keith Espinoza MRN:  [id number] Date of Birth: [birth year]  .PHYSICAL THERAPY DISCHARGE SUMMARY  Visits from Start of Care: 27.  Current functional level related to goals / functional outcomes: See above.   Remaining deficits: See goal section.   Education / Equipment: HEP. Plan: Patient agrees to discharge.  Patient goals were not met. Patient is being discharged due to being pleased with the current functional level.  ?????         Mali Applegate MPT

## 2016-11-23 ENCOUNTER — Ambulatory Visit (INDEPENDENT_AMBULATORY_CARE_PROVIDER_SITE_OTHER): Payer: Medicare Other | Admitting: Orthopaedic Surgery

## 2016-11-23 ENCOUNTER — Encounter (INDEPENDENT_AMBULATORY_CARE_PROVIDER_SITE_OTHER): Payer: Self-pay | Admitting: Orthopaedic Surgery

## 2016-11-23 VITALS — BP 156/77 | HR 64 | Ht 70.0 in | Wt 190.0 lb

## 2016-11-23 DIAGNOSIS — Z96652 Presence of left artificial knee joint: Secondary | ICD-10-CM

## 2016-11-23 DIAGNOSIS — E538 Deficiency of other specified B group vitamins: Secondary | ICD-10-CM | POA: Diagnosis not present

## 2016-11-23 NOTE — Progress Notes (Signed)
Office Visit Note   Patient: Keith Espinoza           Date of Birth: 1941/04/26           MRN: [id number] Visit Date: 11/23/2016              Requested by: [name], MD [address] [place], [place] [address] PCP: Henrine Screws, MD   Assessment & Plan: Visit Diagnoses:  1. S/P total knee arthroplasty, left     Plan: Patient is now 4 months out he lacks about 4 reaching full extension. He's used a 7.5 weight over his anterior knee but his foot up on a coffee table. We discussed prone positioning. Opposite knee which has some arthritis but not as severe as the left before surgery still also lacks a few degrees reaching full extension. 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 We discussed prone positioning it the last few degrees of extension if he wants to work on this.  Follow-Up Instructions: No Follow-up on file.   Orders:  No orders of the defined types were placed in this encounter.  No orders of the defined types were placed in this encounter.     Procedures: No procedures performed   Clinical Data: No additional findings.   Subjective: Chief Complaint  Patient presents with  . Left Knee - Follow-up    Patient returns for four week follow up left knee. He is status post left total knee arthroplasty on 07/23/2016. He states that he is still unable to completely extend the knee. He has been using 7-8lb weights several times a day. He has also been attending physical therapy. He states that yesterday the therapist stated he hasn't gained any, but he hasn't lost any either. He is also still not able to bend knee completely back.     Review of Systems  Constitutional: Negative for chills and diaphoresis.  HENT: Negative for ear discharge, ear pain and nosebleeds.   Eyes: Negative for discharge and visual disturbance.  Respiratory: Negative for cough, choking and shortness of  breath.   Cardiovascular: Negative for chest pain and palpitations.  Gastrointestinal: Negative for abdominal distention and abdominal pain.  Endocrine: Negative for cold intolerance and heat intolerance.  Genitourinary: Negative for flank pain and hematuria.  Skin: Negative for rash and wound.  Neurological: Negative for seizures and speech difficulty.  Hematological: Negative for adenopathy. Does not bruise/bleed easily.  Psychiatric/Behavioral: Negative for agitation and suicidal ideas.     Objective: Vital Signs: BP (!) 156/77   Pulse 64   Ht 5\' 10"  (1.778 m)   Wt 190 lb (86.2 kg)   BMI 27.26 kg/m   Physical Exam  Constitutional: He is oriented to person, place, and time. He appears well-developed and well-nourished.  HENT:  Head: Normocephalic and atraumatic.  Eyes: EOM are normal. Pupils are equal, round, and reactive to light.  Neck: No tracheal deviation present. No thyromegaly present.  Cardiovascular: Normal rate.   Pulmonary/Chest: Effort normal. He has no wheezes.  Abdominal: Soft. Bowel sounds are normal.  Musculoskeletal:  Incisions well-healed has nice cosmetic scar lateral ligaments or balance. There is no knee effusion no pain with hip range of motion. Opposite knee shows some crepitus with knee flexion extension. Both knees lack 3-4 reaching full extension.  Neurological: He is alert and oriented to person, place, and time.  Skin: Skin is  warm and dry. Capillary refill takes less than 2 seconds.  Psychiatric: He has a normal mood and affect. His behavior is normal. Judgment and thought content normal.    Ortho Exam  Specialty Comments:  No specialty comments available.  Imaging: No results found.   PMFS History: Patient Active Problem List   Diagnosis Date Noted  . S/P total knee arthroplasty, left 10/26/2016  . Hyperlipidemia 09/04/2014  . Thrombocytopenia ([place])   . Precordial chest pain 04/05/2014  . Coronary atherosclerosis of native coronary  artery 10/01/2013  . Other and unspecified hyperlipidemia 10/01/2013  . Essential hypertension, benign 10/01/2013  . Insulin dependent type 2 diabetes mellitus ([place]) 10/01/2013  . Esophageal reflux 10/01/2013  . Hypertrophy of prostate without urinary obstruction and other lower urinary tract symptoms (LUTS) 10/01/2013   Past Medical History:  Diagnosis Date  . Allergic rhinitis   . Allergic rhinitis   . Arthritis   . Chronic leg pain    right  . Chronic lower back pain   . Coronary artery disease    a. Stenting to RCA 2004; staged DES to LAD and Cx 2004. DES to mRCA 2012. b. DES to mCx, PTCA to dCx 11/2011. c. Lateral wall MI 2013 s/p PTCA to distal Cx & DES to mid OM2 11/2011. d. Low risk nuc 04/2014, EF wnl.  . Diabetes mellitus    Insulin dependent  . Diabetic neuropathy (HCC)    MILD  . Diverticulosis   . Dysrhythmia   . Gilbert syndrome   . Gout    right wrist; right foot; right elbow; have had it since 1970's  . H/O hiatal hernia   . Heart murmur   . History of echocardiogram    aortic sclerosis per echo 12/09 EF 65%, otherwise normal  . History of hemorrhoids    BLEEDING  . Hypertension    Diagnosed 1995   . Pancreatic pseudocyst    a. s/p remote drainage 2006.  Marland Kitchen Thrombocytopenia ([place])    Seen on oldest labs in system from 2004  . Vitamin B 12 deficiency    orally replaced    Family History  Problem Relation Age of Onset  . Diabetes Mother   . Hyperlipidemia Mother   . Hypertension Mother   . Cancer Father   . Hypertension Father   . Diabetes Sister   . Hypertension Sister   . Cancer Brother   . Heart attack Neg Hx     Past Surgical History:  Procedure Laterality Date  . BACK SURGERY     "total of 3 times" S/P fall   . CARPAL TUNNEL RELEASE Bilateral   . CHOLECYSTECTOMY  1990's  . COLONOSCOPY    . CORONARY ANGIOPLASTY  11/11/11  . CORONARY ANGIOPLASTY WITH STENT PLACEMENT  09/30/2011   "1 then; makes a total of 4"  . CORONARY ANGIOPLASTY WITH STENT  PLACEMENT  11/11/11   "1; makes a total of 5"  . INGUINAL HERNIA REPAIR  2003   right  . JOINT REPLACEMENT Right 04/03/2002   hip replacment  . KNEE ARTHROSCOPY  1990's   left  . LEFT HEART CATHETERIZATION WITH CORONARY ANGIOGRAM N/A 09/30/2011   Procedure: LEFT HEART CATHETERIZATION WITH CORONARY ANGIOGRAM;  Surgeon: Jettie Booze, MD;  Location: [facility] CATH LAB;  Service: Cardiovascular;  Laterality: N/A;  possible PCI  . LEFT HEART CATHETERIZATION WITH CORONARY ANGIOGRAM N/A 11/15/2011   Procedure: LEFT HEART CATHETERIZATION WITH CORONARY ANGIOGRAM;  Surgeon: Jettie Booze, MD;  Location: [facility] CATH  LAB;  Service: Cardiovascular;  Laterality: N/A;  . PERCUTANEOUS CORONARY STENT INTERVENTION (PCI-S)  09/30/2011   Procedure: PERCUTANEOUS CORONARY STENT INTERVENTION (PCI-S);  Surgeon: Jettie Booze, MD;  Location: [facility] CATH LAB;  Service: Cardiovascular;;  . PERCUTANEOUS CORONARY STENT INTERVENTION (PCI-S) N/A 11/11/2011   Procedure: PERCUTANEOUS CORONARY STENT INTERVENTION (PCI-S);  Surgeon: Jettie Booze, MD;  Location: [facility] CATH LAB;  Service: Cardiovascular;  Laterality: N/A;  . SHOULDER SURGERY Right    X 2  . TONSILLECTOMY  ~ 1948  . TOTAL KNEE ARTHROPLASTY Left 07/23/2016   Procedure: LEFT TOTAL KNEE ARTHROPLASTY;  Surgeon: Marybelle Killings, MD;  Location: [place];  Service: Orthopedics;  Laterality: Left;   Social History   Occupational History  . Retired    Social History Main Topics  . Smoking status: Former Research scientist (life sciences)  . Smokeless tobacco: Current User    Types: Chew  . Alcohol use No  . Drug use: No  . Sexual activity: Not on file

## 2016-11-26 ENCOUNTER — Encounter: Payer: Self-pay | Admitting: Interventional Cardiology

## 2016-11-30 DIAGNOSIS — D81818 Other biotin-dependent carboxylase deficiency: Secondary | ICD-10-CM | POA: Diagnosis not present

## 2016-12-05 NOTE — Progress Notes (Signed)
Patient ID: Keith Espinoza, male   DOB: 1941-03-04, 76 y.o.   MRN: [id number]     Cardiology Office Note   Date:  12/06/2016   ID:  Keith Espinoza, DOB 1940-10-24, MRN [id number]  PCP:  Keith Screws, MD    No chief complaint on file. f/u CAD   Wt Readings from Last 3 Encounters:  12/06/16 185 lb (83.9 kg)  11/23/16 190 lb (86.2 kg)  10/26/16 190 lb (86.2 kg)       History of Present Illness: Keith Espinoza is a 76 y.o. male  who has had CAD. He has checked his BP. Higher when he eats salt. At other times it is in the 120s. CAD/ASCVD:  Denies :  Dizziness. Dyspnea on exertion.  Fatigue.  Leg edema.  Nitroglycerin.  Palpitations.  Syncope.  In 11/15, He went to the ER due to chest discomfort. It was not as severe as his prior MI. He had a negative workup there. He was sent home from the ER. Imdur was started. He is feeling better now.  No further chest pain.  He works in the yard.  He does eat a lot of salt. He essentially eats fatty foods as well if he wants. He is not sticking to any type of cardiac diet. He is not doing any regular walking either. He is not used any nitroglycerin. He will occasionally have a twinge in the left side of his chest which is different from his prior anginal pain.    Past Medical History:  Diagnosis Date  . Allergic rhinitis   . Allergic rhinitis   . Arthritis   . Chronic leg pain    right  . Chronic lower back pain   . Coronary artery disease    a. Stenting to RCA 2004; staged DES to LAD and Cx 2004. DES to mRCA 2012. b. DES to mCx, PTCA to dCx 11/2011. c. Lateral wall MI 2013 s/p PTCA to distal Cx & DES to mid OM2 11/2011. d. Low risk nuc 04/2014, EF wnl.  . Diabetes mellitus    Insulin dependent  . Diabetic neuropathy (HCC)    MILD  . Diverticulosis   . Dysrhythmia   . Gilbert syndrome   . Gout    right wrist; right foot; right elbow; have had it since 1970's  . H/O hiatal hernia   . Heart murmur   . History of echocardiogram     aortic sclerosis per echo 12/09 EF 65%, otherwise normal  . History of hemorrhoids    BLEEDING  . Hypertension    Diagnosed 1995   . Pancreatic pseudocyst    a. s/p remote drainage 2006.  Marland Kitchen Thrombocytopenia ([place])    Seen on oldest labs in system from 2004  . Vitamin B 12 deficiency    orally replaced    Past Surgical History:  Procedure Laterality Date  . BACK SURGERY     "total of 3 times" S/P fall   . CARPAL TUNNEL RELEASE Bilateral   . CHOLECYSTECTOMY  1990's  . COLONOSCOPY    . CORONARY ANGIOPLASTY  11/11/11  . CORONARY ANGIOPLASTY WITH STENT PLACEMENT  09/30/2011   "1 then; makes a total of 4"  . CORONARY ANGIOPLASTY WITH STENT PLACEMENT  11/11/11   "1; makes a total of 5"  . INGUINAL HERNIA REPAIR  2003   right  . JOINT REPLACEMENT Right 04/03/2002   hip replacment  . KNEE ARTHROSCOPY  1990's   left  . LEFT  HEART CATHETERIZATION WITH CORONARY ANGIOGRAM N/A 09/30/2011   Procedure: LEFT HEART CATHETERIZATION WITH CORONARY ANGIOGRAM;  Surgeon: Keith Booze, MD;  Location: [facility] CATH LAB;  Service: Cardiovascular;  Laterality: N/A;  possible PCI  . LEFT HEART CATHETERIZATION WITH CORONARY ANGIOGRAM N/A 11/15/2011   Procedure: LEFT HEART CATHETERIZATION WITH CORONARY ANGIOGRAM;  Surgeon: Keith Booze, MD;  Location: [facility] CATH LAB;  Service: Cardiovascular;  Laterality: N/A;  . PERCUTANEOUS CORONARY STENT INTERVENTION (PCI-S)  09/30/2011   Procedure: PERCUTANEOUS CORONARY STENT INTERVENTION (PCI-S);  Surgeon: Keith Booze, MD;  Location: [facility] CATH LAB;  Service: Cardiovascular;;  . PERCUTANEOUS CORONARY STENT INTERVENTION (PCI-S) N/A 11/11/2011   Procedure: PERCUTANEOUS CORONARY STENT INTERVENTION (PCI-S);  Surgeon: Keith Booze, MD;  Location: [facility] CATH LAB;  Service: Cardiovascular;  Laterality: N/A;  . SHOULDER SURGERY Right    X 2  . TONSILLECTOMY  ~ 1948  . TOTAL KNEE ARTHROPLASTY Left 07/23/2016   Procedure: LEFT TOTAL KNEE ARTHROPLASTY;  Surgeon: Keith Killings, MD;  Location: [place];  Service: Orthopedics;  Laterality: Left;     Current Outpatient Prescriptions  Medication Sig Dispense Refill  . allopurinol (ZYLOPRIM) 300 MG tablet Take 300 mg by mouth daily.     Marland Kitchen amLODipine (NORVASC) 5 MG tablet Take 5 mg by mouth daily as needed (high blood pressure).     Marland Kitchen aspirin (BAYER ASPIRIN) 325 MG tablet Take 1 tablet (325 mg total) by mouth daily. (Patient taking differently: Take 81 mg by mouth daily. ) 30 tablet 0  . clopidogrel (PLAVIX) 75 MG tablet TAKE ONE TABLET BY MOUTH ONE TIME DAILY 30 tablet 5  . gabapentin (NEURONTIN) 100 MG capsule Take 300-400 mg by mouth 2 (two) times daily.     Marland Kitchen glimepiride (AMARYL) 4 MG tablet Take 4 mg by mouth 2 (two) times daily.     . insulin detemir (LEVEMIR) 100 UNIT/ML injection Inject 30-50 Units into the skin daily as needed (blood sugar). CBG 150-200 = 30 units, 200-250 = 45 units, CBG > 250 = 50 units     . isosorbide mononitrate (IMDUR) 30 MG 24 hr tablet Take 1 tablet (30 mg total) by mouth daily. 30 tablet 11  . metFORMIN (GLUCOPHAGE) 1000 MG tablet Take 1 tablet (1,000 mg total) by mouth 2 (two) times daily.    . nitroGLYCERIN (NITROSTAT) 0.4 MG SL tablet Place 0.4 mg under the tongue every 5 (five) minutes as needed for chest pain.    Marland Kitchen telmisartan-hydrochlorothiazide (MICARDIS HCT) 80-12.5 MG per tablet 2 tablets by mouth daily. (Patient taking differently: Take 1 tablet by mouth daily. )    . terazosin (HYTRIN) 5 MG capsule Take 5 mg by mouth at bedtime.     . potassium chloride SA (K-DUR,KLOR-CON) 20 MEQ tablet Take 1 tablet (20 mEq total) by mouth daily. 30 tablet 6   No current facility-administered medications for this visit.     Allergies:   Simvastatin; Zetia [ezetimibe]; Dilaudid [hydromorphone hcl]; and Fish oil    Social History:  The patient  reports that he has quit smoking. His smokeless tobacco use includes Chew. He reports that he does not drink alcohol or use drugs.   Family  History:  The patient's family history includes Cancer in his brother and father; Diabetes in his mother and sister; Hyperlipidemia in his mother; Hypertension in his father, mother, and sister.    ROS:  Please see the history of present illness.   Otherwise, review of systems are positive for  twinges of left sided chest pain.   All other systems are reviewed and negative.    PHYSICAL EXAM: VS:  BP (!) 154/90 (BP Location: Right Arm, Patient Position: Sitting, Cuff Size: Normal)   Pulse 70   Ht 5\' 10"  (1.778 m)   Wt 185 lb (83.9 kg)   BMI 26.54 kg/m  , BMI Body mass index is 26.54 kg/m. GEN: Well nourished, well developed, in no acute distress  HEENT: normal  Neck: no JVD, carotid bruits, or masses Cardiac: RRR; no murmurs, rubs, or gallops,no edema  Respiratory:  clear to auscultation bilaterally, normal work of breathing GI: soft, nontender, nondistended, + BS MS: no deformity or atrophy  Skin: warm and dry, no rash Neuro:  Strength and sensation are intact Psych: euthymic mood, full affect   EKG:   The ekg ordered today demonstrates normal sinus rhythm, no ST segment changes   Recent Labs: 07/14/2016: ALT 13 07/26/2016: BUN 24; Creatinine, Ser 1.25; Hemoglobin 8.5; Platelets 105; Potassium 3.6; Sodium 137   Lipid Panel    Component Value Date/Time   CHOL 117 04/06/2014 0230   TRIG 123 04/06/2014 0230   HDL 34 (L) 04/06/2014 0230   CHOLHDL 3.4 04/06/2014 0230   VLDL 25 04/06/2014 0230   LDLCALC 58 04/06/2014 0230     Other studies Reviewed: Additional studies/ records that were reviewed today with results demonstrating: Multiple stents in past.   .   ASSESSMENT AND PLAN:  1. CAD: No angina.  Continue clopidogrel given that he has multiple stents.  Could consider stopping aspirin if there are bleeding issues. 2. Hyperlipidemia: Followed by Dr. Inda Merlin.   He stopped taking Crestor. I've asked him to restart Crestor 10 mg once a week. Will increase frequency depending  on his cholesterol results. He has had myalgias in the past with statins.  We discussed switching to pravastatin or referring for a PCSK9 inhibitor. He has Crestor at home city would like to retry this. Even if he can take 10 mg once a week, this may give him some benefit. 3. HTN:  At home, readings are in the 160/70 range.  Restart amlodipine 2.5 mg daily.  He enjoys salt and consumes a lot.   4. DM: Managed by primary care doctor.  He admits to dietary indiscretion.     Current medicines are reviewed at length with the patient today.  The patient concerns regarding his medicines were addressed.  The following changes have been made:  Restart Crestor 10 mg once a week.    Labs/ tests ordered today include:  No orders of the defined types were placed in this encounter.   Recommend 150 minutes/week of aerobic exercise Low fat, low carb, high fiber diet recommended  Disposition:   FU in 1 year   Signed, Larae Grooms, MD  12/06/2016 3:27 PM    [facility] [place], [place], [place]  [address] Phone: [phone]; Fax: [phone]

## 2016-12-06 ENCOUNTER — Encounter: Payer: Self-pay | Admitting: Interventional Cardiology

## 2016-12-06 ENCOUNTER — Ambulatory Visit (INDEPENDENT_AMBULATORY_CARE_PROVIDER_SITE_OTHER): Payer: Medicare Other | Admitting: Interventional Cardiology

## 2016-12-06 ENCOUNTER — Other Ambulatory Visit: Payer: Self-pay

## 2016-12-06 VITALS — BP 154/90 | HR 70 | Ht 70.0 in | Wt 185.0 lb

## 2016-12-06 DIAGNOSIS — I10 Essential (primary) hypertension: Secondary | ICD-10-CM | POA: Diagnosis not present

## 2016-12-06 DIAGNOSIS — R072 Precordial pain: Secondary | ICD-10-CM

## 2016-12-06 DIAGNOSIS — I2510 Atherosclerotic heart disease of native coronary artery without angina pectoris: Secondary | ICD-10-CM

## 2016-12-06 DIAGNOSIS — E782 Mixed hyperlipidemia: Secondary | ICD-10-CM

## 2016-12-06 MED ORDER — ROSUVASTATIN CALCIUM 10 MG PO TABS: 10.0000 mg | ORAL_TABLET | ORAL | 3 refills | Status: DC

## 2016-12-06 MED ORDER — ROSUVASTATIN CALCIUM 10 MG PO TABS: 10.0000 mg | ORAL_TABLET | Freq: Every day | ORAL | 3 refills | Status: DC

## 2016-12-06 MED ORDER — AMLODIPINE BESYLATE 2.5 MG PO TABS: 2.5000 mg | ORAL_TABLET | Freq: Every day | ORAL | 3 refills | Status: DC

## 2016-12-06 NOTE — Patient Instructions (Addendum)
Medication Instructions:  Restart Crestor 10 mg at bedtime once weekly and Amlodipine 2.5 mg daily. All other there medications remain the same.  Labwork: None  Testing/Procedures: None  Follow-Up: Your physician wants you to follow-up in: 1 year. You will receive a reminder letter in the mail two months in advance. If you don't receive a letter, please call our office to schedule the follow-up appointment.   Any Other Special Instructions Will Be Listed Below (If Applicable). Someone from [employer] [facility] will be contacting you concerning new medication.   If you need a refill on your cardiac medications before your next appointment, please call your pharmacy.

## 2016-12-29 DIAGNOSIS — D81818 Other biotin-dependent carboxylase deficiency: Secondary | ICD-10-CM | POA: Diagnosis not present

## 2017-01-19 ENCOUNTER — Other Ambulatory Visit: Payer: Self-pay | Admitting: Interventional Cardiology

## 2017-01-24 DIAGNOSIS — L814 Other melanin hyperpigmentation: Secondary | ICD-10-CM | POA: Diagnosis not present

## 2017-01-24 DIAGNOSIS — L638 Other alopecia areata: Secondary | ICD-10-CM | POA: Diagnosis not present

## 2017-01-24 DIAGNOSIS — D225 Melanocytic nevi of trunk: Secondary | ICD-10-CM | POA: Diagnosis not present

## 2017-01-24 DIAGNOSIS — L821 Other seborrheic keratosis: Secondary | ICD-10-CM | POA: Diagnosis not present

## 2017-01-24 DIAGNOSIS — L570 Actinic keratosis: Secondary | ICD-10-CM | POA: Diagnosis not present

## 2017-01-24 DIAGNOSIS — D3610 Benign neoplasm of peripheral nerves and autonomic nervous system, unspecified: Secondary | ICD-10-CM | POA: Diagnosis not present

## 2017-01-31 DIAGNOSIS — D81818 Other biotin-dependent carboxylase deficiency: Secondary | ICD-10-CM | POA: Diagnosis not present

## 2017-03-03 DIAGNOSIS — E538 Deficiency of other specified B group vitamins: Secondary | ICD-10-CM | POA: Diagnosis not present

## 2017-03-16 ENCOUNTER — Ambulatory Visit (INDEPENDENT_AMBULATORY_CARE_PROVIDER_SITE_OTHER): Payer: Medicare Other | Admitting: Orthopaedic Surgery

## 2017-03-16 ENCOUNTER — Ambulatory Visit (INDEPENDENT_AMBULATORY_CARE_PROVIDER_SITE_OTHER): Payer: Medicare Other

## 2017-03-16 ENCOUNTER — Telehealth (INDEPENDENT_AMBULATORY_CARE_PROVIDER_SITE_OTHER): Payer: Self-pay | Admitting: Orthopaedic Surgery

## 2017-03-16 ENCOUNTER — Encounter (INDEPENDENT_AMBULATORY_CARE_PROVIDER_SITE_OTHER): Payer: Self-pay | Admitting: Orthopaedic Surgery

## 2017-03-16 VITALS — BP 162/72 | HR 67 | Ht 70.0 in | Wt 190.0 lb

## 2017-03-16 DIAGNOSIS — S63659A Sprain of metacarpophalangeal joint of unspecified finger, initial encounter: Secondary | ICD-10-CM | POA: Insufficient documentation

## 2017-03-16 DIAGNOSIS — M79642 Pain in left hand: Secondary | ICD-10-CM | POA: Diagnosis not present

## 2017-03-16 DIAGNOSIS — I2510 Atherosclerotic heart disease of native coronary artery without angina pectoris: Secondary | ICD-10-CM | POA: Diagnosis not present

## 2017-03-16 HISTORY — DX: Sprain of metacarpophalangeal joint of unspecified finger, initial encounter: S63.659A

## 2017-03-16 NOTE — Telephone Encounter (Signed)
Keith Espinoza with [facility] called needing office note, op note, demographics and insurance faxed over to them. The fax # is [phone]

## 2017-03-16 NOTE — Progress Notes (Signed)
Office Visit Note   Patient: Keith Espinoza           Date of Birth: [birth year]           MRN: [id number] Visit Date: 03/16/2017              Requested by: [name], MD [address] [place], [place] [address] PCP: [name], MD   Assessment & Plan: Visit Diagnoses:  1. Pain in left hand   2. 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 We will recheck him in 4 weeks. He'll keep splint on all the time and not flexes MCP joint.  Follow-Up Instructions: Return in about 4 years (around 03/16/2021).   Orders:  Orders Placed This Encounter  Procedures  . XR Hand Complete Left   No orders of the defined types were placed in this encounter.     Procedures: No procedures performed   Clinical Data: No additional findings.   Subjective: Chief Complaint  Patient presents with  . Left Hand - Pain    HPI 76 year old male was backing up truck turning around and he was using as the steering well as he turned and suddenly felt sharp acute pain of left hand third MCP joint and noticed subluxation of his extensor tendon. Has pain and popping with acute flexion relief with his finger in extension. This is a closed injury history of problems with his hand. He's finished the rehabbing a left total knee arthroplasty and doing well with his knee.  Review of Systems 14 point review of systems updated unchanged from last office visit other than as mentioned in history of present illness   Objective: Vital Signs: BP (!) 162/72   Pulse 67   Ht 5\' 10"  (1.778 m)   Wt 190 lb (86.2 kg)   BMI 27.26 kg/m   Physical Exam  Constitutional: He is oriented to person, place, and time. He appears well-developed and well-nourished.  HENT:  Head: Normocephalic and atraumatic.  Eyes: EOM are normal. Pupils are equal,  round, and reactive to light.  Neck: No tracheal deviation present. No thyromegaly present.  Cardiovascular: Normal rate.   Pulmonary/Chest: Effort normal. He has no wheezes.  Abdominal: Soft. Bowel sounds are normal.  Musculoskeletal:  Fusiform swelling around the left third MCP joint. With active flexion is able to him and straight rupture of the radial sagittal band was subluxation of the extensor tendon toward the ulnar aspect into the gutter. He reduces with extension. Sensation the fingertip is intact.  Neurological: He is alert and oriented to person, place, and time.  Skin: Skin is warm and dry. Capillary refill takes less than 2 seconds.  Psychiatric: He has a normal mood and affect. His behavior is normal. Judgment and thought content normal.    Ortho Exam  Specialty Comments:  No specialty comments available.  Imaging: Xr Hand Complete Left  Result Date: 03/16/2017 Three-view x-rays the left hand obtained which are negative for fracture. No significant arthritic changes. Impression normal left hand x-rays    PMFS History: Patient Active Problem List   Diagnosis Date Noted  . 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 S/P total knee arthroplasty, left 10/26/2016  . Hyperlipidemia 09/04/2014  . Thrombocytopenia ([place])   . Precordial chest pain 04/05/2014  . Coronary atherosclerosis of  native coronary artery 10/01/2013  . Other and unspecified hyperlipidemia 10/01/2013  . Essential hypertension, benign 10/01/2013  . Insulin dependent type 2 diabetes mellitus ([place]) 10/01/2013  . Esophageal reflux 10/01/2013  . Hypertrophy of prostate without urinary obstruction and other lower urinary tract symptoms (LUTS) 10/01/2013   Past Medical History:  Diagnosis Date  . Allergic rhinitis   . Allergic rhinitis   . Arthritis   . Chronic leg pain    right  . Chronic lower back pain   . Coronary artery disease    a. Stenting to RCA 2004; staged DES to LAD  and Cx 2004. DES to mRCA 2012. b. DES to mCx, PTCA to dCx 11/2011. c. Lateral wall MI 2013 s/p PTCA to distal Cx & DES to mid OM2 11/2011. d. Low risk nuc 04/2014, EF wnl.  . Diabetes mellitus    Insulin dependent  . Diabetic neuropathy (HCC)    MILD  . Diverticulosis   . Dysrhythmia   . Gilbert syndrome   . Gout    right wrist; right foot; right elbow; have had it since 1970's  . H/O hiatal hernia   . Heart murmur   . History of echocardiogram    aortic sclerosis per echo 12/09 EF 65%, otherwise normal  . History of hemorrhoids    BLEEDING  . Hypertension    Diagnosed 1995   . Pancreatic pseudocyst    a. s/p remote drainage 2006.  Marland Kitchen Thrombocytopenia ([place])    Seen on oldest labs in system from 2004  . Vitamin B 12 deficiency    orally replaced    Family History  Problem Relation Age of Onset  . Diabetes Mother   . Hyperlipidemia Mother   . Hypertension Mother   . Cancer Father   . Hypertension Father   . Diabetes Sister   . Hypertension Sister   . Cancer Brother   . Heart attack Neg Hx     Past Surgical History:  Procedure Laterality Date  . BACK SURGERY     "total of 3 times" S/P fall   . CARPAL TUNNEL RELEASE Bilateral   . CHOLECYSTECTOMY  1990's  . COLONOSCOPY    . CORONARY ANGIOPLASTY  11/11/11  . CORONARY ANGIOPLASTY WITH STENT PLACEMENT  09/30/2011   "1 then; makes a total of 4"  . CORONARY ANGIOPLASTY WITH STENT PLACEMENT  11/11/11   "1; makes a total of 5"  . INGUINAL HERNIA REPAIR  2003   right  . JOINT REPLACEMENT Right 04/03/2002   hip replacment  . KNEE ARTHROSCOPY  1990's   left  . LEFT HEART CATHETERIZATION WITH CORONARY ANGIOGRAM N/A 09/30/2011   Procedure: LEFT HEART CATHETERIZATION WITH CORONARY ANGIOGRAM;  Surgeon: Jettie Booze, MD;  Location: [facility] CATH LAB;  Service: Cardiovascular;  Laterality: N/A;  possible PCI  . LEFT HEART CATHETERIZATION WITH CORONARY ANGIOGRAM N/A 11/15/2011   Procedure: LEFT HEART CATHETERIZATION WITH CORONARY ANGIOGRAM;   Surgeon: Jettie Booze, MD;  Location: [facility] CATH LAB;  Service: Cardiovascular;  Laterality: N/A;  . PERCUTANEOUS CORONARY STENT INTERVENTION (PCI-S)  09/30/2011   Procedure: PERCUTANEOUS CORONARY STENT INTERVENTION (PCI-S);  Surgeon: Jettie Booze, MD;  Location: [facility] CATH LAB;  Service: Cardiovascular;;  . PERCUTANEOUS CORONARY STENT INTERVENTION (PCI-S) N/A 11/11/2011   Procedure: PERCUTANEOUS CORONARY STENT INTERVENTION (PCI-S);  Surgeon: Jettie Booze, MD;  Location: [facility] CATH LAB;  Service: Cardiovascular;  Laterality: N/A;  . SHOULDER SURGERY Right    X 2  . TONSILLECTOMY  ~  [place] Left 07/23/2016   Procedure: LEFT TOTAL KNEE ARTHROPLASTY;  Surgeon: Marybelle Killings, MD;  Location: [place];  Service: Orthopedics;  Laterality: Left;   Social History   Occupational History  . Retired    Social History Main Topics  . Smoking status: Former Research scientist (life sciences)  . Smokeless tobacco: Current User    Types: Chew  . Alcohol use No  . Drug use: No  . Sexual activity: Not on file

## 2017-03-17 ENCOUNTER — Telehealth (INDEPENDENT_AMBULATORY_CARE_PROVIDER_SITE_OTHER): Payer: Self-pay | Admitting: Radiology

## 2017-03-17 DIAGNOSIS — M25342 Other instability, left hand: Secondary | ICD-10-CM | POA: Diagnosis not present

## 2017-03-17 DIAGNOSIS — S63659A Sprain of metacarpophalangeal joint of unspecified finger, initial encounter: Secondary | ICD-10-CM | POA: Diagnosis not present

## 2017-03-17 DIAGNOSIS — M25542 Pain in joints of left hand: Secondary | ICD-10-CM | POA: Diagnosis not present

## 2017-03-17 NOTE — Telephone Encounter (Signed)
Murlean Caller, [facility], was not in the office when Dr. Lorin Mercy called to discuss brace for patient. She would like a call back to discuss. Patient has appt at 1:00pm today.  Amanda's cell-[phone]

## 2017-03-17 NOTE — Telephone Encounter (Signed)
Info faxed

## 2017-03-17 NOTE — Telephone Encounter (Signed)
Called discussed

## 2017-03-23 DIAGNOSIS — S63659A Sprain of metacarpophalangeal joint of unspecified finger, initial encounter: Secondary | ICD-10-CM | POA: Diagnosis not present

## 2017-03-23 DIAGNOSIS — M25342 Other instability, left hand: Secondary | ICD-10-CM | POA: Diagnosis not present

## 2017-03-23 DIAGNOSIS — M25542 Pain in joints of left hand: Secondary | ICD-10-CM | POA: Diagnosis not present

## 2017-03-24 DIAGNOSIS — H524 Presbyopia: Secondary | ICD-10-CM | POA: Diagnosis not present

## 2017-03-24 DIAGNOSIS — E119 Type 2 diabetes mellitus without complications: Secondary | ICD-10-CM | POA: Diagnosis not present

## 2017-03-24 DIAGNOSIS — H353131 Nonexudative age-related macular degeneration, bilateral, early dry stage: Secondary | ICD-10-CM | POA: Diagnosis not present

## 2017-03-24 DIAGNOSIS — Z794 Long term (current) use of insulin: Secondary | ICD-10-CM | POA: Diagnosis not present

## 2017-03-24 DIAGNOSIS — H52203 Unspecified astigmatism, bilateral: Secondary | ICD-10-CM | POA: Diagnosis not present

## 2017-03-24 DIAGNOSIS — Z961 Presence of intraocular lens: Secondary | ICD-10-CM | POA: Diagnosis not present

## 2017-04-04 DIAGNOSIS — E538 Deficiency of other specified B group vitamins: Secondary | ICD-10-CM | POA: Diagnosis not present

## 2017-04-04 DIAGNOSIS — D81818 Other biotin-dependent carboxylase deficiency: Secondary | ICD-10-CM | POA: Diagnosis not present

## 2017-04-19 ENCOUNTER — Ambulatory Visit (INDEPENDENT_AMBULATORY_CARE_PROVIDER_SITE_OTHER): Payer: Medicare Other | Admitting: Orthopaedic Surgery

## 2017-04-19 ENCOUNTER — Encounter (INDEPENDENT_AMBULATORY_CARE_PROVIDER_SITE_OTHER): Payer: Self-pay | Admitting: Orthopaedic Surgery

## 2017-04-19 VITALS — BP 149/77 | HR 72 | Ht 70.0 in | Wt 190.0 lb

## 2017-04-19 DIAGNOSIS — S63659A Sprain of metacarpophalangeal joint of unspecified finger, initial encounter: Secondary | ICD-10-CM | POA: Diagnosis not present

## 2017-04-19 DIAGNOSIS — I2510 Atherosclerotic heart disease of native coronary artery without angina pectoris: Secondary | ICD-10-CM | POA: Diagnosis not present

## 2017-04-19 NOTE — Progress Notes (Signed)
Office Visit Note   Patient: Keith Espinoza           Date of Birth: January 20, 1941           MRN: [id number] Visit Date: 04/19/2017              Requested by: [name], MD [address] [place], [place] [address] PCP: [name], MD   Assessment & Plan: Visit Diagnoses:  1. 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 He can then work on flexion is fingertips. He can buddy tape long finger to ring finger and I will check him back again on a when necessary basis.  Follow-Up Instructions: Return if symptoms worsen or fail to improve.   Orders:  No orders of the defined types were placed in this encounter.  No orders of the defined types were placed in this encounter.     Procedures: No procedures performed   Clinical Data: No additional findings.   Subjective: Chief Complaint  Patient presents with  . Left Middle Finger - Follow-up    HPI 22 show male returns post left hand long finger sagittal band rupture at the MCP joint. He is in custom splint blocking the flexion of the MCP joint. He is applied a Band-Aid since he's had some rubbing over the volar surface of the proximal phalanx. He's been compliant with the splint removing it only to take shower but keep his finger and extended position.  Review of Systems review of systems updated and unchanged from 03/16/2017 office visit   Objective: Vital Signs: BP (!) 149/77   Pulse 72   Ht 5\' 10"  (1.778 m)   Wt 190 lb (86.2 kg)   BMI 27.26 kg/m   Physical Exam  Constitutional: He is oriented to person, place, and time. He appears well-developed and well-nourished.  HENT:  Head: Normocephalic and atraumatic.  Eyes: Pupils are equal, round, and reactive to light. EOM are normal.  Neck: No tracheal deviation present. No thyromegaly present.  Cardiovascular: Normal rate.   Pulmonary/Chest:  Effort normal. He has no wheezes.  Abdominal: Soft. Bowel sounds are normal.  Musculoskeletal:  With the splint removed with flexion the extensor tendon long finger states centrally located does not sublux off into the groove. Sensation fingertip is intact PIP is not stiff he still reaches full extension and he can flex plastic past 90 passively. He has a Band-Aid area where he had some skin irritation. Sensation fingertip is intact.  Neurological: He is alert and oriented to person, place, and time.  Skin: Skin is warm and dry. Capillary refill takes less than 2 seconds.  Psychiatric: He has a normal mood and affect. His behavior is normal. Judgment and thought content normal.    Ortho Exam  Specialty Comments:  No specialty comments available.  Imaging: No results found.   PMFS History: Patient Active Problem List   Diagnosis Date Noted  . 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 S/P total knee arthroplasty, left 10/26/2016  . Hyperlipidemia 09/04/2014  . Thrombocytopenia ([place])   . Precordial chest pain 04/05/2014  . Coronary atherosclerosis of native coronary artery 10/01/2013  . Other and unspecified hyperlipidemia 10/01/2013  . Essential hypertension, benign 10/01/2013  . Insulin dependent type 2 diabetes mellitus ([place]) 10/01/2013  . Esophageal reflux 10/01/2013  . Hypertrophy of prostate without urinary  obstruction and other lower urinary tract symptoms (LUTS) 10/01/2013   Past Medical History:  Diagnosis Date  . Allergic rhinitis   . Allergic rhinitis   . Arthritis   . Chronic leg pain    right  . Chronic lower back pain   . Coronary artery disease    a. Stenting to RCA 2004; staged DES to LAD and Cx 2004. DES to mRCA 2012. b. DES to mCx, PTCA to dCx 11/2011. c. Lateral wall MI 2013 s/p PTCA to distal Cx & DES to mid OM2 11/2011. d. Low risk nuc 04/2014, EF wnl.  . Diabetes mellitus    Insulin dependent  . Diabetic neuropathy (HCC)    MILD    . Diverticulosis   . Dysrhythmia   . Gilbert syndrome   . Gout    right wrist; right foot; right elbow; have had it since 1970's  . H/O hiatal hernia   . Heart murmur   . History of echocardiogram    aortic sclerosis per echo 12/09 EF 65%, otherwise normal  . History of hemorrhoids    BLEEDING  . Hypertension    Diagnosed 1995   . Pancreatic pseudocyst    a. s/p remote drainage 2006.  Marland Kitchen Thrombocytopenia ([place])    Seen on oldest labs in system from 2004  . Vitamin B 12 deficiency    orally replaced    Family History  Problem Relation Age of Onset  . Diabetes Mother   . Hyperlipidemia Mother   . Hypertension Mother   . Cancer Father   . Hypertension Father   . Diabetes Sister   . Hypertension Sister   . Cancer Brother   . Heart attack Neg Hx     Past Surgical History:  Procedure Laterality Date  . BACK SURGERY     "total of 3 times" S/P fall   . CARPAL TUNNEL RELEASE Bilateral   . CHOLECYSTECTOMY  1990's  . COLONOSCOPY    . CORONARY ANGIOPLASTY  11/11/11  . CORONARY ANGIOPLASTY WITH STENT PLACEMENT  09/30/2011   "1 then; makes a total of 4"  . CORONARY ANGIOPLASTY WITH STENT PLACEMENT  11/11/11   "1; makes a total of 5"  . INGUINAL HERNIA REPAIR  2003   right  . JOINT REPLACEMENT Right 04/03/2002   hip replacment  . KNEE ARTHROSCOPY  1990's   left  . LEFT HEART CATHETERIZATION WITH CORONARY ANGIOGRAM N/A 09/30/2011   Procedure: LEFT HEART CATHETERIZATION WITH CORONARY ANGIOGRAM;  Surgeon: Jettie Booze, MD;  Location: [facility] CATH LAB;  Service: Cardiovascular;  Laterality: N/A;  possible PCI  . LEFT HEART CATHETERIZATION WITH CORONARY ANGIOGRAM N/A 11/15/2011   Procedure: LEFT HEART CATHETERIZATION WITH CORONARY ANGIOGRAM;  Surgeon: Jettie Booze, MD;  Location: [facility] CATH LAB;  Service: Cardiovascular;  Laterality: N/A;  . PERCUTANEOUS CORONARY STENT INTERVENTION (PCI-S)  09/30/2011   Procedure: PERCUTANEOUS CORONARY STENT INTERVENTION (PCI-S);  Surgeon: Jettie Booze, MD;  Location: [facility] CATH LAB;  Service: Cardiovascular;;  . PERCUTANEOUS CORONARY STENT INTERVENTION (PCI-S) N/A 11/11/2011   Procedure: PERCUTANEOUS CORONARY STENT INTERVENTION (PCI-S);  Surgeon: Jettie Booze, MD;  Location: [facility] CATH LAB;  Service: Cardiovascular;  Laterality: N/A;  . SHOULDER SURGERY Right    X 2  . TONSILLECTOMY  ~ 1948  . TOTAL KNEE ARTHROPLASTY Left 07/23/2016   Procedure: LEFT TOTAL KNEE ARTHROPLASTY;  Surgeon: Marybelle Killings, MD;  Location: [place];  Service: Orthopedics;  Laterality: Left;   Social History   Occupational  History  . Retired    Social History Main Topics  . Smoking status: Former Research scientist (life sciences)  . Smokeless tobacco: Current User    Types: Chew  . Alcohol use No  . Drug use: No  . Sexual activity: Not on file

## 2017-05-05 DIAGNOSIS — D81818 Other biotin-dependent carboxylase deficiency: Secondary | ICD-10-CM | POA: Diagnosis not present

## 2017-06-03 DIAGNOSIS — T25232A Burn of second degree of left toe(s) (nail), initial encounter: Secondary | ICD-10-CM | POA: Diagnosis not present

## 2017-06-08 ENCOUNTER — Ambulatory Visit (INDEPENDENT_AMBULATORY_CARE_PROVIDER_SITE_OTHER): Payer: Medicare Other | Admitting: Orthopedic Surgery

## 2017-06-08 ENCOUNTER — Encounter (INDEPENDENT_AMBULATORY_CARE_PROVIDER_SITE_OTHER): Payer: Self-pay | Admitting: Orthopedic Surgery

## 2017-06-08 DIAGNOSIS — T25222A Burn of second degree of left foot, initial encounter: Secondary | ICD-10-CM | POA: Insufficient documentation

## 2017-06-08 DIAGNOSIS — I2510 Atherosclerotic heart disease of native coronary artery without angina pectoris: Secondary | ICD-10-CM | POA: Diagnosis not present

## 2017-06-08 MED ORDER — CEPHALEXIN 500 MG PO CAPS: 500.0000 mg | ORAL_CAPSULE | Freq: Three times a day (TID) | ORAL | 0 refills | Status: DC

## 2017-06-08 MED ORDER — SILVER SULFADIAZINE 1 % EX CREA: 1.0000 "application " | TOPICAL_CREAM | Freq: Every day | CUTANEOUS | 3 refills | Status: DC
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
## 2017-06-09 DIAGNOSIS — D81818 Other biotin-dependent carboxylase deficiency: Secondary | ICD-10-CM | POA: Diagnosis not present

## 2017-06-09 DIAGNOSIS — E1342 Other specified diabetes mellitus with diabetic polyneuropathy: Secondary | ICD-10-CM | POA: Diagnosis not present

## 2017-06-09 DIAGNOSIS — Z0001 Encounter for general adult medical examination with abnormal findings: Secondary | ICD-10-CM | POA: Diagnosis not present

## 2017-06-09 DIAGNOSIS — I25118 Atherosclerotic heart disease of native coronary artery with other forms of angina pectoris: Secondary | ICD-10-CM | POA: Diagnosis not present

## 2017-06-09 DIAGNOSIS — E1140 Type 2 diabetes mellitus with diabetic neuropathy, unspecified: Secondary | ICD-10-CM | POA: Diagnosis not present

## 2017-06-09 DIAGNOSIS — E559 Vitamin D deficiency, unspecified: Secondary | ICD-10-CM | POA: Diagnosis not present

## 2017-06-09 DIAGNOSIS — Z23 Encounter for immunization: Secondary | ICD-10-CM | POA: Diagnosis not present

## 2017-06-09 DIAGNOSIS — E784 Other hyperlipidemia: Secondary | ICD-10-CM | POA: Diagnosis not present

## 2017-06-09 DIAGNOSIS — I10 Essential (primary) hypertension: Secondary | ICD-10-CM | POA: Diagnosis not present

## 2017-06-09 DIAGNOSIS — Z1389 Encounter for screening for other disorder: Secondary | ICD-10-CM | POA: Diagnosis not present

## 2017-06-09 DIAGNOSIS — Z79899 Other long term (current) drug therapy: Secondary | ICD-10-CM | POA: Diagnosis not present

## 2017-06-09 DIAGNOSIS — E538 Deficiency of other specified B group vitamins: Secondary | ICD-10-CM | POA: Diagnosis not present

## 2017-06-09 DIAGNOSIS — M109 Gout, unspecified: Secondary | ICD-10-CM | POA: Diagnosis not present

## 2017-06-22 ENCOUNTER — Encounter (INDEPENDENT_AMBULATORY_CARE_PROVIDER_SITE_OTHER): Payer: Self-pay | Admitting: Orthopedic Surgery

## 2017-06-22 ENCOUNTER — Ambulatory Visit (INDEPENDENT_AMBULATORY_CARE_PROVIDER_SITE_OTHER): Payer: Medicare Other | Admitting: Orthopedic Surgery

## 2017-06-22 DIAGNOSIS — E119 Type 2 diabetes mellitus without complications: Secondary | ICD-10-CM

## 2017-06-22 DIAGNOSIS — I2510 Atherosclerotic heart disease of native coronary artery without angina pectoris: Secondary | ICD-10-CM

## 2017-06-22 DIAGNOSIS — Z794 Long term (current) use of insulin: Secondary | ICD-10-CM

## 2017-06-22 DIAGNOSIS — T25222A Burn of second degree of left foot, initial encounter: Secondary | ICD-10-CM
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
## 2017-07-11 DIAGNOSIS — E538 Deficiency of other specified B group vitamins: Secondary | ICD-10-CM | POA: Diagnosis not present

## 2017-08-15 DIAGNOSIS — E538 Deficiency of other specified B group vitamins: Secondary | ICD-10-CM | POA: Diagnosis not present

## 2017-12-05 DIAGNOSIS — Z961 Presence of intraocular lens: Secondary | ICD-10-CM | POA: Diagnosis not present

## 2017-12-05 DIAGNOSIS — H35352 Cystoid macular degeneration, left eye: Secondary | ICD-10-CM | POA: Diagnosis not present

## 2017-12-05 DIAGNOSIS — H353132 Nonexudative age-related macular degeneration, bilateral, intermediate dry stage: Secondary | ICD-10-CM | POA: Diagnosis not present

## 2017-12-05 DIAGNOSIS — E113312 Type 2 diabetes mellitus with moderate nonproliferative diabetic retinopathy with macular edema, left eye: Secondary | ICD-10-CM | POA: Diagnosis not present

## 2017-12-13 NOTE — Progress Notes (Signed)
Cardiology Office Note   Date:  12/15/2017   ID:  Keith Espinoza, DOB 1941/10/04, MRN [id number]  PCP:  [name], MD    No chief complaint on file.  CAD  Wt Readings from Last 3 Encounters:  12/15/17 201 lb 1.9 oz (91.2 kg)  04/19/17 190 lb (86.2 kg)  03/16/17 190 lb (86.2 kg)       History of Present Illness: Keith Espinoza is a 77 y.o. male  who has had CAD.  In 11/15, He went to the ER due to chest discomfort. It was not as severe as his prior MI. He had a negative workup there. He was sent home from the ER. Imdur was started.  Denies : Exertional Chest pain. Dizziness. Leg edema. Nitroglycerin use. Orthopnea. Palpitations. Paroxysmal nocturnal dyspnea. Shortness of breath. Syncope.   Occasional twinges in the chest. Not like prior angina.  Suffered one fall.  No bleeding issues.   Past Medical History:  Diagnosis Date  . Allergic rhinitis   . Allergic rhinitis   . Arthritis   . Chronic leg pain    right  . Chronic lower back pain   . Coronary artery disease    a. Stenting to RCA 2004; staged DES to LAD and Cx 2004. DES to mRCA 2012. b. DES to mCx, PTCA to dCx 11/2011. c. Lateral wall MI 2013 s/p PTCA to distal Cx & DES to mid OM2 11/2011. d. Low risk nuc 04/2014, EF wnl.  . Diabetes mellitus    Insulin dependent  . Diabetic neuropathy (HCC)    MILD  . Diverticulosis   . Dysrhythmia   . Gilbert syndrome   . Gout    right wrist; right foot; right elbow; have had it since 1970's  . H/O hiatal hernia   . Heart murmur   . History of echocardiogram    aortic sclerosis per echo 12/09 EF 65%, otherwise normal  . History of hemorrhoids    BLEEDING  . Hypertension    Diagnosed 1995   . Pancreatic pseudocyst    a. s/p remote drainage 2006.  Marland Kitchen Thrombocytopenia ([place])    Seen on oldest labs in system from 2004  . Vitamin B 12 deficiency    orally replaced    Past Surgical History:  Procedure Laterality Date  . BACK SURGERY     "total of 3 times" S/P  fall   . CARPAL TUNNEL RELEASE Bilateral   . CHOLECYSTECTOMY  1990's  . COLONOSCOPY    . CORONARY ANGIOPLASTY  11/11/11  . CORONARY ANGIOPLASTY WITH STENT PLACEMENT  09/30/2011   "1 then; makes a total of 4"  . CORONARY ANGIOPLASTY WITH STENT PLACEMENT  11/11/11   "1; makes a total of 5"  . INGUINAL HERNIA REPAIR  2003   right  . JOINT REPLACEMENT Right 04/03/2002   hip replacment  . KNEE ARTHROSCOPY  1990's   left  . LEFT HEART CATHETERIZATION WITH CORONARY ANGIOGRAM N/A 09/30/2011   Procedure: LEFT HEART CATHETERIZATION WITH CORONARY ANGIOGRAM;  Surgeon: Jettie Booze, MD;  Location: [facility] CATH LAB;  Service: Cardiovascular;  Laterality: N/A;  possible PCI  . LEFT HEART CATHETERIZATION WITH CORONARY ANGIOGRAM N/A 11/15/2011   Procedure: LEFT HEART CATHETERIZATION WITH CORONARY ANGIOGRAM;  Surgeon: Jettie Booze, MD;  Location: [facility] CATH LAB;  Service: Cardiovascular;  Laterality: N/A;  . PERCUTANEOUS CORONARY STENT INTERVENTION (PCI-S)  09/30/2011   Procedure: PERCUTANEOUS CORONARY STENT INTERVENTION (PCI-S);  Surgeon: Jettie Booze, MD;  Location: [place] CATH LAB;  Service: Cardiovascular;;  . PERCUTANEOUS CORONARY STENT INTERVENTION (PCI-S) N/A 11/11/2011   Procedure: PERCUTANEOUS CORONARY STENT INTERVENTION (PCI-S);  Surgeon: Jettie Booze, MD;  Location: [facility] CATH LAB;  Service: Cardiovascular;  Laterality: N/A;  . SHOULDER SURGERY Right    X 2  . TONSILLECTOMY  ~ 1948  . TOTAL KNEE ARTHROPLASTY Left 07/23/2016   Procedure: LEFT TOTAL KNEE ARTHROPLASTY;  Surgeon: Marybelle Killings, MD;  Location: [place];  Service: Orthopedics;  Laterality: Left;     Current Outpatient Medications  Medication Sig Dispense Refill  . allopurinol (ZYLOPRIM) 300 MG tablet Take 300 mg by mouth daily.     Marland Kitchen amLODipine (NORVASC) 2.5 MG tablet Take 2.5 mg by mouth daily.    Marland Kitchen aspirin EC 81 MG tablet Take 81 mg by mouth daily.    . cephALEXin (KEFLEX) 500 MG capsule Take 1 capsule (500 mg total) by  mouth 3 (three) times daily. 30 capsule 0  . clopidogrel (PLAVIX) 75 MG tablet TAKE ONE TABLET BY MOUTH ONE TIME DAILY 30 tablet 5  . gabapentin (NEURONTIN) 300 MG capsule Take 300 mg by mouth 2 (two) times daily.  3  . glimepiride (AMARYL) 4 MG tablet Take 4 mg by mouth 2 (two) times daily.     . insulin detemir (LEVEMIR) 100 UNIT/ML injection Inject 30-50 Units into the skin daily as needed (blood sugar). CBG 150-200 = 30 units, 200-250 = 45 units, CBG > 250 = 50 units     . isosorbide mononitrate (IMDUR) 30 MG 24 hr tablet TAKE 1 TABLET (30 MG TOTAL) BY MOUTH DAILY. 30 tablet 11  . losartan-hydrochlorothiazide (HYZAAR) 50-12.5 MG tablet Take 1 tablet by mouth daily.  3  . metFORMIN (GLUCOPHAGE) 1000 MG tablet Take 1 tablet (1,000 mg total) by mouth 2 (two) times daily.    . nitroGLYCERIN (NITROSTAT) 0.4 MG SL tablet Place 0.4 mg under the tongue every 5 (five) minutes as needed for chest pain.    . ONE TOUCH ULTRA TEST test strip CHECK BLOOD SUGAR ONCE DAILY AS DIRECTED  5  . telmisartan-hydrochlorothiazide (MICARDIS HCT) 80-12.5 MG per tablet 2 tablets by mouth daily. (Patient taking differently: Take 1 tablet by mouth daily. )    . terazosin (HYTRIN) 5 MG capsule Take 5 mg by mouth at bedtime.     . rosuvastatin (CRESTOR) 10 MG tablet Take 1 tablet (10 mg total) by mouth once a week. 15 tablet 3   No current facility-administered medications for this visit.     Allergies:   Simvastatin; Zetia [ezetimibe]; Dilaudid [hydromorphone hcl]; and Fish oil    Social History:  The patient  reports that he has quit smoking. His smokeless tobacco use includes chew. He reports that he does not drink alcohol or use drugs.   Family History:  The patient's family history includes Cancer in his brother and father; Diabetes in his mother and sister; Hyperlipidemia in his mother; Hypertension in his father, mother, and sister.    ROS:  Please see the history of present illness.   Otherwise, review of  systems are positive for recent fall- no syncope, just tripped.   All other systems are reviewed and negative.    PHYSICAL EXAM: VS:  BP (!) 142/82   Pulse 67   Ht 5\' 10"  (1.778 m)   Wt 201 lb 1.9 oz (91.2 kg)   SpO2 95%   BMI 28.86 kg/m  , BMI Body mass index is 28.86 kg/m. GEN:  Well nourished, well developed, in no acute distress  HEENT: normal  Neck: no JVD, carotid bruits, or masses Cardiac: RRR; no murmurs, rubs, or gallops,no edema  Respiratory:  clear to auscultation bilaterally, normal work of breathing GI: soft, nontender, nondistended, + BS MS: no deformity or atrophy  Skin: warm and dry, no rash Neuro:  Strength and sensation are intact Psych: euthymic mood, full affect   EKG:   The ekg ordered today demonstrates NSR, PVC, nonspecific lateral ST changes   Recent Labs: No results found for requested labs within last 8760 hours.   Lipid Panel    Component Value Date/Time   CHOL 117 04/06/2014 0230   TRIG 123 04/06/2014 0230   HDL 34 (L) 04/06/2014 0230   CHOLHDL 3.4 04/06/2014 0230   VLDL 25 04/06/2014 0230   LDLCALC 58 04/06/2014 0230     Other studies Reviewed: Additional studies/ records that were reviewed today with results demonstrating: Labs reviewed.   ASSESSMENT AND PLAN:  1. CAD: No angina on medical therapy.  Continue aggressive secondary prevention.  Increase activity as noted below. 2. Hyperlipidemia: LDL 64 in September 2018.  ALT normal at that time.   3. HTN: The current medical regimen is effective;  continue present plan and medications. 4. DM: A1c 6.2 in September 2018.  Continue current blood sugar lowering medicines.   5. Tobacco abuse: I encouraged him to stop his use of chewing tobacco, but he does not seem interested.   Current medicines are reviewed at length with the patient today.  The patient concerns regarding his medicines were addressed.  The following changes have been made:  No change  Labs/ tests ordered today  include:   Orders Placed This Encounter  Procedures  . EKG 12-Lead    Recommend 150 minutes/week of aerobic exercise Low fat, low carb, high fiber diet recommended  Disposition:   FU in 1 year   Signed, Larae Grooms, MD  12/15/2017 [facility] [address] [place], [place],   [address] Phone: [phone]; Fax: [phone]

## 2017-12-14 DIAGNOSIS — I10 Essential (primary) hypertension: Secondary | ICD-10-CM | POA: Diagnosis not present

## 2017-12-14 DIAGNOSIS — D81818 Other biotin-dependent carboxylase deficiency: Secondary | ICD-10-CM | POA: Diagnosis not present

## 2017-12-14 DIAGNOSIS — E1140 Type 2 diabetes mellitus with diabetic neuropathy, unspecified: Secondary | ICD-10-CM | POA: Diagnosis not present

## 2017-12-14 DIAGNOSIS — R531 Weakness: Secondary | ICD-10-CM | POA: Diagnosis not present

## 2017-12-14 DIAGNOSIS — K219 Gastro-esophageal reflux disease without esophagitis: Secondary | ICD-10-CM | POA: Diagnosis not present

## 2017-12-14 DIAGNOSIS — Z7984 Long term (current) use of oral hypoglycemic drugs: Secondary | ICD-10-CM | POA: Diagnosis not present

## 2017-12-14 DIAGNOSIS — E785 Hyperlipidemia, unspecified: Secondary | ICD-10-CM | POA: Diagnosis not present

## 2017-12-14 DIAGNOSIS — E1342 Other specified diabetes mellitus with diabetic polyneuropathy: Secondary | ICD-10-CM | POA: Diagnosis not present

## 2017-12-14 DIAGNOSIS — E538 Deficiency of other specified B group vitamins: Secondary | ICD-10-CM | POA: Diagnosis not present

## 2017-12-15 ENCOUNTER — Ambulatory Visit (INDEPENDENT_AMBULATORY_CARE_PROVIDER_SITE_OTHER): Payer: Medicare Other | Admitting: Interventional Cardiology

## 2017-12-15 ENCOUNTER — Other Ambulatory Visit: Payer: Self-pay

## 2017-12-15 ENCOUNTER — Encounter: Payer: Self-pay | Admitting: Interventional Cardiology

## 2017-12-15 ENCOUNTER — Encounter (INDEPENDENT_AMBULATORY_CARE_PROVIDER_SITE_OTHER): Payer: Self-pay

## 2017-12-15 VITALS — BP 142/82 | HR 67 | Ht 70.0 in | Wt 201.1 lb

## 2017-12-15 DIAGNOSIS — E782 Mixed hyperlipidemia: Secondary | ICD-10-CM

## 2017-12-15 DIAGNOSIS — Z720 Tobacco use: Secondary | ICD-10-CM

## 2017-12-15 DIAGNOSIS — I2510 Atherosclerotic heart disease of native coronary artery without angina pectoris: Secondary | ICD-10-CM | POA: Diagnosis not present

## 2017-12-15 DIAGNOSIS — E1159 Type 2 diabetes mellitus with other circulatory complications: Secondary | ICD-10-CM

## 2017-12-15 DIAGNOSIS — I10 Essential (primary) hypertension: Secondary | ICD-10-CM | POA: Diagnosis not present

## 2017-12-15 NOTE — Patient Instructions (Signed)

## 2018-01-02 DIAGNOSIS — H353132 Nonexudative age-related macular degeneration, bilateral, intermediate dry stage: Secondary | ICD-10-CM | POA: Diagnosis not present

## 2018-01-02 DIAGNOSIS — E113312 Type 2 diabetes mellitus with moderate nonproliferative diabetic retinopathy with macular edema, left eye: Secondary | ICD-10-CM | POA: Diagnosis not present

## 2018-01-02 DIAGNOSIS — H35352 Cystoid macular degeneration, left eye: Secondary | ICD-10-CM | POA: Diagnosis not present

## 2018-01-05 ENCOUNTER — Other Ambulatory Visit: Payer: Self-pay | Admitting: Interventional Cardiology

## 2018-01-07 ENCOUNTER — Other Ambulatory Visit: Payer: Self-pay | Admitting: Interventional Cardiology

## 2018-01-24 DIAGNOSIS — D81818 Other biotin-dependent carboxylase deficiency: Secondary | ICD-10-CM | POA: Diagnosis not present

## 2018-01-24 DIAGNOSIS — I10 Essential (primary) hypertension: Secondary | ICD-10-CM | POA: Diagnosis not present

## 2018-01-24 DIAGNOSIS — E1165 Type 2 diabetes mellitus with hyperglycemia: Secondary | ICD-10-CM | POA: Diagnosis not present

## 2018-01-24 DIAGNOSIS — E162 Hypoglycemia, unspecified: Secondary | ICD-10-CM | POA: Diagnosis not present

## 2018-02-15 DIAGNOSIS — L570 Actinic keratosis: Secondary | ICD-10-CM | POA: Diagnosis not present

## 2018-02-22 DIAGNOSIS — I10 Essential (primary) hypertension: Secondary | ICD-10-CM | POA: Diagnosis not present

## 2018-02-22 DIAGNOSIS — E1165 Type 2 diabetes mellitus with hyperglycemia: Secondary | ICD-10-CM | POA: Diagnosis not present

## 2018-02-22 DIAGNOSIS — D81818 Other biotin-dependent carboxylase deficiency: Secondary | ICD-10-CM | POA: Diagnosis not present

## 2018-03-20 DIAGNOSIS — H353132 Nonexudative age-related macular degeneration, bilateral, intermediate dry stage: Secondary | ICD-10-CM | POA: Diagnosis not present

## 2018-03-20 DIAGNOSIS — E113312 Type 2 diabetes mellitus with moderate nonproliferative diabetic retinopathy with macular edema, left eye: Secondary | ICD-10-CM | POA: Diagnosis not present

## 2018-03-20 DIAGNOSIS — Z961 Presence of intraocular lens: Secondary | ICD-10-CM | POA: Diagnosis not present

## 2018-03-20 DIAGNOSIS — H35352 Cystoid macular degeneration, left eye: Secondary | ICD-10-CM | POA: Diagnosis not present

## 2018-03-27 DIAGNOSIS — E538 Deficiency of other specified B group vitamins: Secondary | ICD-10-CM | POA: Diagnosis not present

## 2018-03-28 DIAGNOSIS — E119 Type 2 diabetes mellitus without complications: Secondary | ICD-10-CM | POA: Diagnosis not present

## 2018-03-28 DIAGNOSIS — Z794 Long term (current) use of insulin: Secondary | ICD-10-CM | POA: Diagnosis not present

## 2018-03-28 DIAGNOSIS — H353131 Nonexudative age-related macular degeneration, bilateral, early dry stage: Secondary | ICD-10-CM | POA: Diagnosis not present

## 2018-03-28 DIAGNOSIS — Z961 Presence of intraocular lens: Secondary | ICD-10-CM | POA: Diagnosis not present

## 2018-04-20 ENCOUNTER — Other Ambulatory Visit: Payer: Self-pay

## 2018-04-20 ENCOUNTER — Emergency Department (HOSPITAL_COMMUNITY): Payer: Medicare Other

## 2018-04-20 ENCOUNTER — Encounter (HOSPITAL_COMMUNITY): Payer: Self-pay | Admitting: Emergency Medicine

## 2018-04-20 ENCOUNTER — Emergency Department (HOSPITAL_COMMUNITY)
Admission: EM | Admit: 2018-04-20 | Discharge: 2018-04-20 | Disposition: A | Discharge status: Home / Self Care | Payer: Medicare Other | Attending: Emergency Medicine | Admitting: Emergency Medicine

## 2018-04-20 DIAGNOSIS — Z96652 Presence of left artificial knee joint: Secondary | ICD-10-CM | POA: Diagnosis not present

## 2018-04-20 DIAGNOSIS — Z7902 Long term (current) use of antithrombotics/antiplatelets: Secondary | ICD-10-CM | POA: Diagnosis not present

## 2018-04-20 DIAGNOSIS — Z96641 Presence of right artificial hip joint: Secondary | ICD-10-CM | POA: Insufficient documentation

## 2018-04-20 DIAGNOSIS — Z87891 Personal history of nicotine dependence: Secondary | ICD-10-CM | POA: Diagnosis not present

## 2018-04-20 DIAGNOSIS — Z79899 Other long term (current) drug therapy: Secondary | ICD-10-CM | POA: Diagnosis not present

## 2018-04-20 DIAGNOSIS — Y792 Prosthetic and other implants, materials and accessory orthopedic devices associated with adverse incidents: Secondary | ICD-10-CM | POA: Diagnosis not present

## 2018-04-20 DIAGNOSIS — Z955 Presence of coronary angioplasty implant and graft: Secondary | ICD-10-CM | POA: Diagnosis not present

## 2018-04-20 DIAGNOSIS — T84029A Dislocation of unspecified internal joint prosthesis, initial encounter: Secondary | ICD-10-CM

## 2018-04-20 DIAGNOSIS — Z7984 Long term (current) use of oral hypoglycemic drugs: Secondary | ICD-10-CM | POA: Insufficient documentation

## 2018-04-20 DIAGNOSIS — M25551 Pain in right hip: Secondary | ICD-10-CM | POA: Diagnosis not present

## 2018-04-20 DIAGNOSIS — Z96649 Presence of unspecified artificial hip joint: Secondary | ICD-10-CM

## 2018-04-20 DIAGNOSIS — T84020A Dislocation of internal right hip prosthesis, initial encounter: Secondary | ICD-10-CM | POA: Diagnosis not present

## 2018-04-20 DIAGNOSIS — W1830XA Fall on same level, unspecified, initial encounter: Secondary | ICD-10-CM | POA: Diagnosis not present

## 2018-04-20 DIAGNOSIS — Z7982 Long term (current) use of aspirin: Secondary | ICD-10-CM | POA: Insufficient documentation

## 2018-04-20 DIAGNOSIS — Z049 Encounter for examination and observation for unspecified reason: Secondary | ICD-10-CM | POA: Diagnosis not present

## 2018-04-20 DIAGNOSIS — S59911A Unspecified injury of right forearm, initial encounter: Secondary | ICD-10-CM | POA: Diagnosis not present

## 2018-04-20 MED ORDER — PROPOFOL 10 MG/ML IV BOLUS
0.5000 mg/kg | Freq: Once | INTRAVENOUS | Status: DC
  Filled 2018-04-20: qty 20

## 2018-04-20 MED ORDER — PROPOFOL 10 MG/ML IV BOLUS
INTRAVENOUS | Status: AC | PRN
  Administered 2018-04-20: 50 mg via INTRAVENOUS

## 2018-04-20 NOTE — Progress Notes (Signed)
Orthopedic Tech Progress Note Patient Details:  Keith Espinoza 1940-12-19 [id number]  Ortho Devices Type of Ortho Device: Knee Immobilizer Ortho Device/Splint Interventions: Application   Post Interventions Patient Tolerated: Well Instructions Provided: Care of device   [name] 04/20/2018, 4:59 PM

## 2018-04-20 NOTE — ED Notes (Addendum)
Cosents signed for procedure, pt placed on CO2 monitor, cardiac monitor. Respiratory at bedside.

## 2018-04-20 NOTE — ED Notes (Signed)
Wasted remaining propofol, 150mg  in sharps with [place] G, Therapist, sports.

## 2018-04-20 NOTE — Sedation Documentation (Signed)
MD reduced right hip dislocated. Palpable pulse, Xray called

## 2018-04-20 NOTE — ED Provider Notes (Signed)
[facility] EMERGENCY DEPARTMENT Provider Note   CSN: [id number] Arrival date & time: 04/20/18  1401     History   Chief Complaint Chief Complaint  Patient presents with  . Leg Injury    HPI Keith Espinoza is a 77 y.o. male.  HPI She presents with acute onset right hip pain. Just prior to calling EMS the patient was leaning forward, felt a pop in his hip, and since that time has been unable to move the leg, nor ambulate. No subsequent fall, head trauma, other complaints. Pain is severe with motion, better at rest, no medication taken for pain relief. Patient has a history of right hip arthroplasty 16 years ago. Events of the patient is coming by his daughter, wife, they note that the patient is generally healthy, active.  Past Medical History:  Diagnosis Date  . Allergic rhinitis   . Allergic rhinitis   . Arthritis   . Chronic leg pain    right  . Chronic lower back pain   . Coronary artery disease    a. Stenting to RCA 2004; staged DES to LAD and Cx 2004. DES to mRCA 2012. b. DES to mCx, PTCA to dCx 11/2011. c. Lateral wall MI 2013 s/p PTCA to distal Cx & DES to mid OM2 11/2011. d. Low risk nuc 04/2014, EF wnl.  . Diabetes mellitus    Insulin dependent  . Diabetic neuropathy (HCC)    MILD  . Diverticulosis   . Dysrhythmia   . Gilbert syndrome   . Gout    right wrist; right foot; right elbow; have had it since 1970's  . H/O hiatal hernia   . Heart murmur   . History of echocardiogram    aortic sclerosis per echo 12/09 EF 65%, otherwise normal  . History of hemorrhoids    BLEEDING  . Hypertension    Diagnosed 1995   . Pancreatic pseudocyst    a. s/p remote drainage 2006.  Marland Kitchen Thrombocytopenia ([place])    Seen on oldest labs in system from 2004  . Vitamin B 12 deficiency    orally replaced    Patient Active Problem List   Diagnosis Date Noted  . Burn, foot, second degree, left, initial encounter 06/08/2017  . 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 S/P total knee arthroplasty, left 10/26/2016  . Hyperlipidemia 09/04/2014  . Thrombocytopenia ([place])   . Precordial chest pain 04/05/2014  . Coronary atherosclerosis of native coronary artery 10/01/2013  . Other and unspecified hyperlipidemia 10/01/2013  . Essential hypertension, benign 10/01/2013  . Insulin dependent type 2 diabetes mellitus ([place]) 10/01/2013  . Esophageal reflux 10/01/2013  . Hypertrophy of prostate without urinary obstruction and other lower urinary tract symptoms (LUTS) 10/01/2013    Past Surgical History:  Procedure Laterality Date  . BACK SURGERY     "total of 3 times" S/P fall   . CARPAL TUNNEL RELEASE Bilateral   . CHOLECYSTECTOMY  1990's  . COLONOSCOPY    . CORONARY ANGIOPLASTY  11/11/11  . CORONARY ANGIOPLASTY WITH STENT PLACEMENT  09/30/2011   "1 then; makes a total of 4"  . CORONARY ANGIOPLASTY WITH STENT PLACEMENT  11/11/11   "1; makes a total of 5"  . INGUINAL HERNIA REPAIR  2003   right  . JOINT REPLACEMENT Right 04/03/2002   hip replacment  . KNEE ARTHROSCOPY  1990's   left  . LEFT HEART CATHETERIZATION WITH CORONARY ANGIOGRAM N/A 09/30/2011   Procedure: LEFT HEART CATHETERIZATION  WITH CORONARY ANGIOGRAM;  Surgeon: Jettie Booze, MD;  Location: [facility] CATH LAB;  Service: Cardiovascular;  Laterality: N/A;  possible PCI  . LEFT HEART CATHETERIZATION WITH CORONARY ANGIOGRAM N/A 11/15/2011   Procedure: LEFT HEART CATHETERIZATION WITH CORONARY ANGIOGRAM;  Surgeon: Jettie Booze, MD;  Location: [facility] CATH LAB;  Service: Cardiovascular;  Laterality: N/A;  . PERCUTANEOUS CORONARY STENT INTERVENTION (PCI-S)  09/30/2011   Procedure: PERCUTANEOUS CORONARY STENT INTERVENTION (PCI-S);  Surgeon: Jettie Booze, MD;  Location: [name] CATH LAB;  Service: Cardiovascular;;  . PERCUTANEOUS CORONARY STENT INTERVENTION (PCI-S) N/A 11/11/2011   Procedure: PERCUTANEOUS CORONARY STENT INTERVENTION (PCI-S);  Surgeon: Jettie Booze, MD;  Location: [facility] CATH LAB;  Service: Cardiovascular;  Laterality: N/A;  . SHOULDER SURGERY Right    X 2  . TONSILLECTOMY  ~ 1948  . TOTAL KNEE ARTHROPLASTY Left 07/23/2016   Procedure: LEFT TOTAL KNEE ARTHROPLASTY;  Surgeon: Marybelle Killings, MD;  Location: [place];  Service: Orthopedics;  Laterality: Left;        Home Medications    Prior to Admission medications   Medication Sig Start Date End Date Taking? Authorizing Provider  allopurinol (ZYLOPRIM) 300 MG tablet Take 300 mg by mouth daily.     [provider]  amLODipine (NORVASC) 2.5 MG tablet Take 2.5 mg by mouth daily.    [provider]  aspirin EC 81 MG tablet Take 81 mg by mouth daily.    [provider]  cephALEXin (KEFLEX) 500 MG capsule Take 1 capsule (500 mg total) by mouth 3 (three) times daily. 06/08/17   [name], MD  clopidogrel (PLAVIX) 75 MG tablet TAKE ONE TABLET BY MOUTH ONE TIME DAILY 07/18/13   Jettie Booze, MD  gabapentin (NEURONTIN) 300 MG capsule Take 300 mg by mouth 2 (two) times daily. 12/02/17   [provider]  glimepiride (AMARYL) 4 MG tablet Take 4 mg by mouth 2 (two) times daily.     [provider]  insulin detemir (LEVEMIR) 100 UNIT/ML injection Inject 30-50 Units into the skin daily as needed (blood sugar). CBG 150-200 = 30 units, 200-250 = 45 units, CBG > 250 = 50 units     [provider]  isosorbide mononitrate (IMDUR) 30 MG 24 hr tablet TAKE 1 TABLET (30 MG TOTAL) BY MOUTH DAILY. 01/09/18   Jettie Booze, MD  losartan-hydrochlorothiazide [facility]) 50-12.5 MG tablet Take 1 tablet by mouth daily. 10/28/17   [provider]  metFORMIN (GLUCOPHAGE) 1000 MG tablet Take 1 tablet (1,000 mg total) by mouth 2 (two) times daily. 11/13/11   Jettie Booze, MD  nitroGLYCERIN (NITROSTAT) 0.4 MG SL tablet Place 0.4 mg under the tongue every 5 (five) minutes as needed for chest pain.    [provider]  ONE TOUCH ULTRA  TEST test strip CHECK BLOOD SUGAR ONCE DAILY AS DIRECTED 09/23/17   [provider]  rosuvastatin (CRESTOR) 10 MG tablet Take 1 tablet (10 mg total) by mouth once a week. 01/05/18 04/05/18  Jettie Booze, MD  telmisartan-hydrochlorothiazide (MICARDIS HCT) 80-12.5 MG per tablet 2 tablets by mouth daily. Patient taking differently: Take 1 tablet by mouth daily.  11/07/13   Jettie Booze, MD  terazosin (HYTRIN) 5 MG capsule Take 5 mg by mouth at bedtime.     [provider]    Family History Family History  Problem Relation Age of Onset  . Diabetes Mother   . Hyperlipidemia Mother   . Hypertension Mother   .  Cancer Father   . Hypertension Father   . Diabetes Sister   . Hypertension Sister   . Cancer Brother   . Heart attack Neg Hx     Social History Social History   Tobacco Use  . Smoking status: Former Research scientist (life sciences)  . Smokeless tobacco: Current User    Types: Chew  Substance Use Topics  . Alcohol use: No  . Drug use: No     Allergies   Simvastatin; Zetia [ezetimibe]; Dilaudid [hydromorphone hcl]; and Fish oil   Review of Systems Review of Systems  Constitutional:       Per HPI, otherwise negative  HENT:       Per HPI, otherwise negative  Respiratory:       Per HPI, otherwise negative  Cardiovascular:       Per HPI, otherwise negative  Gastrointestinal: Negative for vomiting.  Endocrine:       Negative aside from HPI  Genitourinary:       Neg aside from HPI   Musculoskeletal:       Per HPI, otherwise negative  Skin: Negative.   Neurological: Negative for syncope.     Physical Exam Updated Vital Signs BP (!) 160/98   Pulse 78   Temp 98.6 F (37 C) (Oral)   Resp (!) 21   Wt 86.2 kg (190 lb)   SpO2 99%   BMI 27.26 kg/m   Physical Exam  Constitutional: He is oriented to person, place, and time. He appears well-developed. No distress.  HENT:  Head: Normocephalic and atraumatic.  Eyes: Conjunctivae and EOM are normal.    Cardiovascular: Normal rate and regular rhythm.  Pulmonary/Chest: Effort normal. No stridor. No respiratory distress.  Abdominal: He exhibits no distension.  Musculoskeletal: He exhibits no edema.       Legs: Neurological: He is alert and oriented to person, place, and time.  Skin: Skin is warm and dry.  Psychiatric: He has a normal mood and affect.  Nursing note and vitals reviewed.    ED Treatments / Results  Labs (all labs ordered are listed, but only abnormal results are displayed) Labs Reviewed - No data to display  EKG None  Radiology Dg Pelvis Portable  Result Date: 04/20/2018 CLINICAL DATA:  [place] on right hip.  Right hip pain. EXAM: PORTABLE PELVIS 1-2 VIEWS COMPARISON:  CT from 07/12/2005 FINDINGS: Single view of the pelvis demonstrates that the femoral head prosthesis is superiorly displaced. Findings are most compatible with a dislocation of the right hip arthroplasty. The entire right femoral stem is not visualized. Pelvic bony ring appears to be intact. Multiple calcifications in the pelvis. Degenerative changes in lower lumbar spine. IMPRESSION: Dislocation of the right hip arthroplasty. Electronically Signed   By: Markus Daft M.D.   On: 04/20/2018 14:29    Procedures .Sedation Date/Time: 04/20/2018 3:30 PM Performed by: Carmin Muskrat, MD Authorized by: Carmin Muskrat, MD   Consent:    Consent obtained:  Verbal and written   Consent given by:  Patient   Risks discussed:  Allergic reaction, dysrhythmia, inadequate sedation, nausea, prolonged hypoxia resulting in organ damage, prolonged sedation necessitating reversal, respiratory compromise necessitating ventilatory assistance and intubation and vomiting   Alternatives discussed:  Analgesia without sedation, anxiolysis and regional anesthesia Universal protocol:    Procedure explained and questions answered to patient or proxy's satisfaction: yes     Relevant documents present and verified: yes     Test  results available and properly labeled: yes     Imaging  studies available: yes     Required blood products, implants, devices, and special equipment available: yes     Site/side marked: yes     Immediately prior to procedure a time out was called: yes     Patient identity confirmation method:  Verbally with patient Indications:    Procedure necessitating sedation performed by:  Physician performing sedation   Intended level of sedation:  Deep Pre-sedation assessment:    Time since last food or drink:  3   ASA classification: class 1 - normal, healthy patient     Neck mobility: normal     Mouth opening:  3 or more finger widths   Thyromental distance:  4 finger widths   Mallampati score:  I - soft palate, uvula, fauces, pillars visible   Pre-sedation assessments completed and reviewed: airway patency, cardiovascular function, hydration status, mental status, nausea/vomiting, pain level, respiratory function and temperature     Pre-sedation assessment completed:  04/20/2018 3:00 PM Immediate pre-procedure details:    Reassessment: Patient reassessed immediately prior to procedure     Reviewed: vital signs, relevant labs/tests and NPO status     Verified: bag valve mask available, emergency equipment available, intubation equipment available, IV patency confirmed, oxygen available and suction available   Procedure details (see MAR for exact dosages):    Preoxygenation:  Nasal cannula   Sedation:  Propofol   Intra-procedure monitoring:  Blood pressure monitoring, cardiac monitor, continuous pulse oximetry, frequent LOC assessments, frequent vital sign checks and continuous capnometry   Intra-procedure events: none     Total Provider sedation time (minutes):  20 Post-procedure details:    Post-sedation assessment completed:  04/20/2018 3:50 PM   Attendance: Constant attendance by certified staff until patient recovered     Recovery: Patient returned to pre-procedure baseline      Post-sedation assessments completed and reviewed: airway patency, cardiovascular function, hydration status, mental status, nausea/vomiting, pain level, respiratory function and temperature     Patient is stable for discharge or admission: yes     Patient tolerance:  Tolerated well, no immediate complications .Ortho Injury Treatment Date/Time: 04/20/2018 3:30 PM Performed by: Carmin Muskrat, MD Authorized by: Carmin Muskrat, MD   Consent:    Consent obtained:  Written   Consent given by:  Patient   Risks discussed:  Irreducible dislocation, fracture, recurrent dislocation, restricted joint movement, stiffness and vascular damage   Alternatives discussed:  No treatment and alternative treatmentInjury location: hip Location details: right hip Injury type: dislocation Dislocation type: anterior Spontaneous dislocation: yes Prosthesis: yes Pre-procedure neurovascular assessment: neurovascularly intact Pre-procedure distal perfusion: normal Pre-procedure neurological function: normal Pre-procedure range of motion: reduced  Anesthesia: Local anesthesia used: no  Patient sedated: Yes. Refer to sedation procedure documentation for details of sedation. Manipulation performed: yes Reduction method: abduction, traction and counter traction and external rotation Reduction successful: yes X-ray confirmed reduction: yes Immobilization: splint Splint type: long leg Supplies used: aluminum splint Post-procedure neurovascular assessment: post-procedure neurovascularly intact Post-procedure distal perfusion: normal Post-procedure neurological function: normal Post-procedure range of motion: improved Patient tolerance: Patient tolerated the procedure well with no immediate complications Comments: After the patient's reduction he was able to flex the hip, elevate the leg off the bed himself, states that it feels normal aside from soreness.    (including critical care time)  Medications  Ordered in ED Medications  propofol (DIPRIVAN) 10 mg/mL bolus/IV push 43.1 mg (has no administration in time range)     Initial Impression / Assessment and Plan / ED  Course  I have reviewed the triage vital signs and the nursing notes.  Pertinent labs & imaging results that were available during my care of the patient were reviewed by me and considered in my medical decision making (see chart for details).   For the initial evaluation I discussed patient's case with his orthopedic team, they concur the patient is appropriate for reduction in the emergency department. I discussed this with the patient, his family members, who had previously spoken with orthopedic physician on call, for referral here.   This elderly male presents after spontaneous dislocation of the right hip Patient has prior arthroplasty, but is otherwise well functional elderly male. Patient has no other events or trauma, and after consent was obtained he had successful conscious sedation with reduction of his right hip dislocation. Patient discharged in stable condition.  Final Clinical Impressions(s) / ED Diagnoses  Dislocation of prosthetic right hip, initial encounter   Carmin Muskrat, MD 04/20/18 [phone]

## 2018-04-20 NOTE — Sedation Documentation (Signed)
RN brought pt's family to bedside. Pt conversing with family

## 2018-04-20 NOTE — ED Triage Notes (Addendum)
Pt was working in garden, and tripped- fell and injured right leg. Right leg obvious deformity, leg is internally rotated. Pt had right hip replacement in 2003. Pt was on the ground for 30 min before getting any help. Pt has had total 241mcg fentanyl from EMS. 96% on room air, HR 80 SR, BP 158/85, CBG 114

## 2018-04-22 ENCOUNTER — Emergency Department (HOSPITAL_COMMUNITY): Payer: Medicare Other

## 2018-04-22 ENCOUNTER — Encounter (HOSPITAL_COMMUNITY): Payer: Self-pay | Admitting: Emergency Medicine

## 2018-04-22 ENCOUNTER — Emergency Department (HOSPITAL_COMMUNITY)
Admission: EM | Admit: 2018-04-22 | Discharge: 2018-04-22 | Disposition: A | Discharge status: Home / Self Care | Payer: Medicare Other | Attending: Emergency Medicine | Admitting: Emergency Medicine

## 2018-04-22 DIAGNOSIS — I2510 Atherosclerotic heart disease of native coronary artery without angina pectoris: Secondary | ICD-10-CM | POA: Insufficient documentation

## 2018-04-22 DIAGNOSIS — S8991XA Unspecified injury of right lower leg, initial encounter: Secondary | ICD-10-CM | POA: Diagnosis not present

## 2018-04-22 DIAGNOSIS — Z79899 Other long term (current) drug therapy: Secondary | ICD-10-CM | POA: Diagnosis not present

## 2018-04-22 DIAGNOSIS — I10 Essential (primary) hypertension: Secondary | ICD-10-CM | POA: Insufficient documentation

## 2018-04-22 DIAGNOSIS — M25461 Effusion, right knee: Secondary | ICD-10-CM | POA: Insufficient documentation

## 2018-04-22 DIAGNOSIS — M25561 Pain in right knee: Secondary | ICD-10-CM | POA: Diagnosis not present

## 2018-04-22 DIAGNOSIS — Z87891 Personal history of nicotine dependence: Secondary | ICD-10-CM | POA: Insufficient documentation

## 2018-04-22 DIAGNOSIS — E119 Type 2 diabetes mellitus without complications: Secondary | ICD-10-CM | POA: Diagnosis not present

## 2018-04-22 DIAGNOSIS — Z7982 Long term (current) use of aspirin: Secondary | ICD-10-CM | POA: Diagnosis not present

## 2018-04-22 DIAGNOSIS — Z794 Long term (current) use of insulin: Secondary | ICD-10-CM | POA: Insufficient documentation

## 2018-04-22 NOTE — Discharge Instructions (Signed)
I drained about 20 mL of fluid off your right knee. Hopefully that gives you some relief. Follow-up with the orthopedic doctor as scheduled for next Friday 7/26.  Keep wearing the knee immobilizer until you see the orthopedist. You may take it off for bathing and if you will be sitting at home. You may apply ice to the knee while it is not in the immobilizer.  Thank you for allowing me to take care of you today!

## 2018-04-22 NOTE — ED Provider Notes (Signed)
[facility] EMERGENCY DEPARTMENT Provider Note  CSN: [id number] Arrival date & time: 04/22/18  1302  History   Chief Complaint Chief Complaint  Patient presents with  . Leg Injury    HPI Keith Espinoza is a 77 y.o. male with a medical history of osteoarthritis who presented to the ED for right knee s/p right hip reduction that occurred on 04/20/18. Keith Espinoza reports right knee swelling and pain with ROM. Denies redness, rash, arthralgias in hip or ankle, inability to bear weight. Denies new trauma, falls or injuries since reduction.  Past Medical History:  Diagnosis Date  . Allergic rhinitis   . Allergic rhinitis   . Arthritis   . Chronic leg pain    right  . Chronic lower back pain   . Coronary artery disease    a. Stenting to RCA 2004; staged DES to LAD and Cx 2004. DES to mRCA 2012. b. DES to mCx, PTCA to dCx 11/2011. c. Lateral wall MI 2013 s/p PTCA to distal Cx & DES to mid OM2 11/2011. d. Low risk nuc 04/2014, EF wnl.  . Diabetes mellitus    Insulin dependent  . Diabetic neuropathy (HCC)    MILD  . Diverticulosis   . Dysrhythmia   . Gilbert syndrome   . Gout    right wrist; right foot; right elbow; have had it since 1970's  . H/O hiatal hernia   . Heart murmur   . History of echocardiogram    aortic sclerosis per echo 12/09 EF 65%, otherwise normal  . History of hemorrhoids    BLEEDING  . Hypertension    Diagnosed 1995   . Pancreatic pseudocyst    a. s/p remote drainage 2006.  Marland Kitchen Thrombocytopenia ([place])    Seen on oldest labs in system from 2004  . Vitamin B 12 deficiency    orally replaced    Patient Active Problem List   Diagnosis Date Noted  . Burn, foot, second degree, left, initial encounter 06/08/2017  . 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 S/P total knee arthroplasty, left 10/26/2016  . Hyperlipidemia 09/04/2014  . Thrombocytopenia ([place])   . Precordial chest pain 04/05/2014  . Coronary atherosclerosis of native  coronary artery 10/01/2013  . Other and unspecified hyperlipidemia 10/01/2013  . Essential hypertension, benign 10/01/2013  . Insulin dependent type 2 diabetes mellitus ([place]) 10/01/2013  . Esophageal reflux 10/01/2013  . Hypertrophy of prostate without urinary obstruction and other lower urinary tract symptoms (LUTS) 10/01/2013    Past Surgical History:  Procedure Laterality Date  . BACK SURGERY     "total of 3 times" S/P fall   . CARPAL TUNNEL RELEASE Bilateral   . CHOLECYSTECTOMY  1990's  . COLONOSCOPY    . CORONARY ANGIOPLASTY  11/11/11  . CORONARY ANGIOPLASTY WITH STENT PLACEMENT  09/30/2011   "1 then; makes a total of 4"  . CORONARY ANGIOPLASTY WITH STENT PLACEMENT  11/11/11   "1; makes a total of 5"  . INGUINAL HERNIA REPAIR  2003   right  . JOINT REPLACEMENT Right 04/03/2002   hip replacment  . KNEE ARTHROSCOPY  1990's   left  . LEFT HEART CATHETERIZATION WITH CORONARY ANGIOGRAM N/A 09/30/2011   Procedure: LEFT HEART CATHETERIZATION WITH CORONARY ANGIOGRAM;  Surgeon: Jettie Booze, MD;  Location: [facility] CATH LAB;  Service: Cardiovascular;  Laterality: N/A;  possible PCI  . LEFT HEART CATHETERIZATION WITH CORONARY ANGIOGRAM N/A 11/15/2011   Procedure: LEFT HEART CATHETERIZATION WITH CORONARY ANGIOGRAM;  Surgeon: Jettie Booze, MD;  Location: [facility] CATH LAB;  Service: Cardiovascular;  Laterality: N/A;  . PERCUTANEOUS CORONARY STENT INTERVENTION (PCI-S)  09/30/2011   Procedure: PERCUTANEOUS CORONARY STENT INTERVENTION (PCI-S);  Surgeon: Jettie Booze, MD;  Location: [facility] CATH LAB;  Service: Cardiovascular;;  . PERCUTANEOUS CORONARY STENT INTERVENTION (PCI-S) N/A 11/11/2011   Procedure: PERCUTANEOUS CORONARY STENT INTERVENTION (PCI-S);  Surgeon: Jettie Booze, MD;  Location: [facility] CATH LAB;  Service: Cardiovascular;  Laterality: N/A;  . SHOULDER SURGERY Right    X 2  . TONSILLECTOMY  ~ 1948  . TOTAL KNEE ARTHROPLASTY Left 07/23/2016   Procedure: LEFT TOTAL KNEE  ARTHROPLASTY;  Surgeon: Marybelle Killings, MD;  Location: [facility];  Service: Orthopedics;  Laterality: Left;        Home Medications    Prior to Admission medications   Medication Sig Start Date End Date Taking? Authorizing Provider  allopurinol (ZYLOPRIM) 300 MG tablet Take 300 mg by mouth daily.     [provider]  amLODipine (NORVASC) 2.5 MG tablet Take 2.5 mg by mouth daily.    [provider]  aspirin EC 81 MG tablet Take 81 mg by mouth daily.    [provider]  cephALEXin (KEFLEX) 500 MG capsule Take 1 capsule (500 mg total) by mouth 3 (three) times daily. 06/08/17   [name], MD  clopidogrel (PLAVIX) 75 MG tablet TAKE ONE TABLET BY MOUTH ONE TIME DAILY 07/18/13   Jettie Booze, MD  gabapentin (NEURONTIN) 300 MG capsule Take 300 mg by mouth 2 (two) times daily. 12/02/17   [provider]  glimepiride (AMARYL) 4 MG tablet Take 4 mg by mouth 2 (two) times daily.     [provider]  insulin detemir (LEVEMIR) 100 UNIT/ML injection Inject 30-50 Units into the skin daily as needed (blood sugar). CBG 150-200 = 30 units, 200-250 = 45 units, CBG > 250 = 50 units     [provider]  isosorbide mononitrate (IMDUR) 30 MG 24 hr tablet TAKE 1 TABLET (30 MG TOTAL) BY MOUTH DAILY. 01/09/18   Jettie Booze, MD  losartan-hydrochlorothiazide [facility]) 50-12.5 MG tablet Take 1 tablet by mouth daily. 10/28/17   [provider]  metFORMIN (GLUCOPHAGE) 1000 MG tablet Take 1 tablet (1,000 mg total) by mouth 2 (two) times daily. 11/13/11   Jettie Booze, MD  nitroGLYCERIN (NITROSTAT) 0.4 MG SL tablet Place 0.4 mg under the tongue every 5 (five) minutes as needed for chest pain.    [provider]  ONE TOUCH ULTRA TEST test strip CHECK BLOOD SUGAR ONCE DAILY AS DIRECTED 09/23/17   [provider]  rosuvastatin (CRESTOR) 10 MG tablet Take 1 tablet (10 mg total) by mouth once a week. 01/05/18 04/05/18  Jettie Booze,  MD  telmisartan-hydrochlorothiazide (MICARDIS HCT) 80-12.5 MG per tablet 2 tablets by mouth daily. Patient taking differently: Take 1 tablet by mouth daily.  11/07/13   Jettie Booze, MD  terazosin (HYTRIN) 5 MG capsule Take 5 mg by mouth at bedtime.     [provider]    Family History Family History  Problem Relation Age of Onset  . Diabetes Mother   . Hyperlipidemia Mother   . Hypertension Mother   . Cancer Father   . Hypertension Father   . Diabetes Sister   . Hypertension Sister   . Cancer Brother   . Heart attack Neg Hx     Social History Social History   Tobacco  Use  . Smoking status: Former Smoker  . Smokeless tobacco: Current User    Types: Chew  Substance Use Topics  . Alcohol use: No  . Drug use: No     Allergies   Simvastatin; Zetia [ezetimibe]; Dilaudid [hydromorphone hcl]; and Fish oil   Review of Systems Review of Systems  Constitutional: Negative for chills and fever.  Genitourinary: Negative.   Musculoskeletal: Positive for arthralgias and joint swelling.  Skin: Negative.   Neurological: Negative for weakness and numbness.     Physical Exam Updated Vital Signs BP (!) 149/78 (BP Location: Right Arm)   Pulse 80   Temp 98.9 F (37.2 C) (Oral)   Resp 16   SpO2 100%   Physical Exam  Constitutional: Keith Espinoza appears well-developed and well-nourished.  Musculoskeletal:       Right hip: Normal.       Left hip: Normal.       Right knee: Keith Espinoza exhibits swelling and effusion. Keith Espinoza exhibits normal range of motion. Tenderness found.       Left knee: Normal.       Right ankle: Normal.       Left ankle: Normal.  Skin: Skin is warm and intact. No bruising noted. No erythema.  Nursing note and vitals reviewed.    ED Treatments / Results  Labs (all labs ordered are listed, but only abnormal results are displayed) Labs Reviewed - No data to display  EKG None  Radiology Dg Knee Complete 4 Views Right  Result Date: 04/22/2018 CLINICAL  DATA:  Pain inferior to the patella, recent fall. EXAM: RIGHT KNEE - COMPLETE 4+ VIEW COMPARISON:  None. FINDINGS: No evidence of fracture or dislocation. Positive for joint effusion. BILATERAL cartilage calcification. Narrowing of the patellofemoral joint. Vascular calcification. Quadriceps tendon calcification. IMPRESSION: Positive for effusion. Advanced degenerative change. No visible acute fracture. Electronically Signed   By: Staci Righter M.D.   On: 04/22/2018 13:58   Dg Hip Port Unilat W Or Wo Pelvis 1 View Right  Result Date: 04/20/2018 CLINICAL DATA:  Reduction of dislocation EXAM: DG HIP (WITH OR WITHOUT PELVIS) 1V PORT RIGHT COMPARISON:  Pelvis film of 04/20/2018 FINDINGS: This located right femoral prosthesis has been relocated now within the acetabular prosthetic cup on the right. No acute bony abnormality is seen. IMPRESSION: Reduction of dislocation of right prosthetic femoral head now present within the prosthetic right acetabular cup. Electronically Signed   By: Ivar Drape M.D.   On: 04/20/2018 16:07   Procedures .Joint Aspiration/Arthrocentesis Date/Time: 04/22/2018 2:48 PM Performed by: Romie Jumper, PA-C Authorized by: Romie Jumper, PA-C   Consent:    Consent obtained:  Verbal   Consent given by:  Patient   Risks discussed:  Bleeding, incomplete drainage, pain, nerve damage and infection   Alternatives discussed:  No treatment Location:    Location:  Knee   Knee:  R knee Anesthesia (see MAR for exact dosages):    Anesthesia method:  Topical application Procedure details:    Needle gauge:  45 G   Ultrasound guidance: no     Approach:  Lateral   Aspirate amount:  22cc   Aspirate characteristics:  Blood-tinged and clear   Steroid injected: no     Specimen collected: no   Post-procedure details:    Dressing:  Adhesive bandage   Patient tolerance of procedure:  Tolerated well, no immediate complications   (including critical care time)  Medications  Ordered in ED Medications - No data to display  Initial Impression / Assessment and Plan / ED Course  Triage vital signs and the nursing notes have been reviewed.  Pertinent labs & imaging results that were available during care of the patient were reviewed and considered in medical decision making (see chart for details).   Patient presents with knee pain following a hip reduction that was done 2 days ago. Keith Espinoza has had no new injuries, traumas or falls that have involved his right lower extremity. Significant effusion and swelling is appreciated on right knee as a result of the reduction completed the other day. Arthrocentesis was performed today for acute relief. 22cc of blood tinged fluid drained. Decreased concern for septic arthritis and need for aspirate culture due to intact active and passive ROM on exam, patient's ability to bear weight and no s/s of infection. Patient is set to follow-up with ortho next Friday 04/28/18.  Final Clinical Impressions(s) / ED Diagnoses  1. Right Knee Effusion. 22cc drained today via arthrocentesis. Education provided on OTC and supportive treatment for pain and inflammation. Advised to continue wearing knee immobilizer. Has scheduled follow-up with [facility].  Dispo: Home. After thorough clinical evaluation, this patient is determined to be medically stable and can be safely discharged with the previously mentioned treatment and/or outpatient follow-up/referral(s). At this time, there are no other apparent medical conditions that require further screening, evaluation or treatment.   Final diagnoses:  Knee effusion, right    ED Discharge Orders    None        Junita Push 04/22/18 1514    Isla Pence, MD 04/22/18 1520

## 2018-04-22 NOTE — ED Triage Notes (Signed)
Patient complaining of stabbing right knee pain since this morning, states he was seen after a fall on Thursday and was discharged.

## 2018-04-22 NOTE — ED Notes (Signed)
Patient verbalizes understanding of discharge instructions. Opportunity for questioning and answers were provided. 

## 2018-04-25 ENCOUNTER — Ambulatory Visit (INDEPENDENT_AMBULATORY_CARE_PROVIDER_SITE_OTHER): Payer: Medicare Other | Admitting: Orthopaedic Surgery

## 2018-04-25 ENCOUNTER — Telehealth (INDEPENDENT_AMBULATORY_CARE_PROVIDER_SITE_OTHER): Payer: Self-pay | Admitting: Orthopaedic Surgery

## 2018-04-25 ENCOUNTER — Encounter (INDEPENDENT_AMBULATORY_CARE_PROVIDER_SITE_OTHER): Payer: Self-pay | Admitting: Orthopaedic Surgery

## 2018-04-25 VITALS — BP 109/69 | HR 101 | Ht 70.0 in | Wt 190.0 lb

## 2018-04-25 DIAGNOSIS — S73014A Posterior dislocation of right hip, initial encounter: Secondary | ICD-10-CM

## 2018-04-25 DIAGNOSIS — M24451 Recurrent dislocation, right hip: Secondary | ICD-10-CM | POA: Insufficient documentation

## 2018-04-25 DIAGNOSIS — I2510 Atherosclerotic heart disease of native coronary artery without angina pectoris: Secondary | ICD-10-CM

## 2018-04-25 DIAGNOSIS — S73014D Posterior dislocation of right hip, subsequent encounter: Secondary | ICD-10-CM | POA: Diagnosis not present

## 2018-04-25 HISTORY — DX: Recurrent dislocation, right hip: M24.451

## 2018-04-25 NOTE — Telephone Encounter (Signed)
Keith Espinoza /[facility] called stated [facility] wrote script for patient(hard copy) and they can't  fill due to the fact [facility] wrote 04/27/18 on that copy. They can fill IF he e-scribe another script or rewrite.  Please call this pharmacy back to let them know what to do [phone]

## 2018-04-25 NOTE — Telephone Encounter (Signed)
Script has been called in and is ready for pick up.

## 2018-04-25 NOTE — Progress Notes (Addendum)
Office Visit Note   Patient: Keith Espinoza           Date of Birth: 19-Apr-1941           MRN: [id number] Visit Date: 04/25/2018              Requested by: [name], MD [address] [place],  [address] PCP: [name], MD   Assessment & Plan: Visit Diagnoses:  1. Posterior dislocation of right hip, subsequent encounter     Plan: We discussed the importance of using his knee immobilizer at all times except when someone else removes it when he takes a shower.  We discussed positions that cause instability of the hip.  We will recheck him in 5 weeks to recheck his knee.  On return visit we will obtain an AP pelvis x-ray to visualize both hips.  No lateral of the right hip as needed.  Patient requested something for pain Ultram 20 tablets prescribed 1 p.o. 3 times daily as needed pain.  Follow-Up Instructions: No follow-ups on file.   Orders:  No orders of the defined types were placed in this encounter.  No orders of the defined types were placed in this encounter.     Procedures: No procedures performed   Clinical Data: No additional findings.   Subjective: Chief Complaint  Patient presents with  . Right Knee - Pain  . Right Hip - Pain    HPI 77 year old male returns he was out in the garden trying to lean over and reroute some water in the garden was flexed at the hip had some internal rotation and dislocated his right total hip arthroplasty that was performed in 2003.  He returned to the emergency room a few days later had knee aspiration of some bloody fluid in his right knee were previous x-rays that showed some knee osteoarthritis.  He states he is having as much knee pain as he is having hip pain.  He is Dealer with a cane.  Review of Systems 14 point review of systems updated and is unchanged from last office visit.  Of note is type 2 diabetes coronary artery disease previous left total knee arthroplasty.  Right total hip arthroplasty  in 2003.  Right hip dislocation 04/20/2018.  Otherwise negative  as it pertains to HPI.   Objective: Vital Signs: BP 109/69   Pulse (!) 101   Ht 5\' 10"  (1.778 m)   Wt 190 lb (86.2 kg)   BMI 27.26 kg/m   Physical Exam  Constitutional: He is oriented to person, place, and time. He appears well-developed and well-nourished.  HENT:  Head: Normocephalic and atraumatic.  Eyes: Pupils are equal, round, and reactive to light. EOM are normal.  Neck: No tracheal deviation present. No thyromegaly present.  Cardiovascular: Normal rate.  Pulmonary/Chest: Effort normal. He has no wheezes.  Abdominal: Soft. Bowel sounds are normal.  Neurological: He is alert and oriented to person, place, and time.  Skin: Skin is warm and dry. Capillary refill takes less than 2 seconds.  Psychiatric: He has a normal mood and affect. His behavior is normal. Judgment and thought content normal.    Ortho Exam   Specialty Comments:  No specialty comments available.  Imaging: No results found.   PMFS History: Patient Active Problem List   Diagnosis Date Noted  . Burn, foot, second degree, left, initial encounter 06/08/2017  . 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 S/P total knee arthroplasty, left  10/26/2016  . Hyperlipidemia 09/04/2014  . Thrombocytopenia ([place])   . Precordial chest pain 04/05/2014  . Coronary atherosclerosis of native coronary artery 10/01/2013  . Other and unspecified hyperlipidemia 10/01/2013  . Essential hypertension, benign 10/01/2013  . Insulin dependent type 2 diabetes mellitus ([place]) 10/01/2013  . Esophageal reflux 10/01/2013  . Hypertrophy of prostate without urinary obstruction and other lower urinary tract symptoms (LUTS) 10/01/2013   Past Medical History:  Diagnosis Date  . Allergic rhinitis   . Allergic rhinitis   . Arthritis   . Chronic leg pain    right  . Chronic lower back pain   . Coronary artery disease    a. Stenting to RCA 2004;  staged DES to LAD and Cx 2004. DES to mRCA 2012. b. DES to mCx, PTCA to dCx 11/2011. c. Lateral wall MI 2013 s/p PTCA to distal Cx & DES to mid OM2 11/2011. d. Low risk nuc 04/2014, EF wnl.  . Diabetes mellitus    Insulin dependent  . Diabetic neuropathy (HCC)    MILD  . Diverticulosis   . Dysrhythmia   . Gilbert syndrome   . Gout    right wrist; right foot; right elbow; have had it since 1970's  . H/O hiatal hernia   . Heart murmur   . History of echocardiogram    aortic sclerosis per echo 12/09 EF 65%, otherwise normal  . History of hemorrhoids    BLEEDING  . Hypertension    Diagnosed 1995   . Pancreatic pseudocyst    a. s/p remote drainage 2006.  Marland Kitchen Thrombocytopenia ([place])    Seen on oldest labs in system from 2004  . Vitamin B 12 deficiency    orally replaced    Family History  Problem Relation Age of Onset  . Diabetes Mother   . Hyperlipidemia Mother   . Hypertension Mother   . Cancer Father   . Hypertension Father   . Diabetes Sister   . Hypertension Sister   . Cancer Brother   . Heart attack Neg Hx     Past Surgical History:  Procedure Laterality Date  . BACK SURGERY     "total of 3 times" S/P fall   . CARPAL TUNNEL RELEASE Bilateral   . CHOLECYSTECTOMY  1990's  . COLONOSCOPY    . CORONARY ANGIOPLASTY  11/11/11  . CORONARY ANGIOPLASTY WITH STENT PLACEMENT  09/30/2011   "1 then; makes a total of 4"  . CORONARY ANGIOPLASTY WITH STENT PLACEMENT  11/11/11   "1; makes a total of 5"  . INGUINAL HERNIA REPAIR  2003   right  . JOINT REPLACEMENT Right 04/03/2002   hip replacment  . KNEE ARTHROSCOPY  1990's   left  . LEFT HEART CATHETERIZATION WITH CORONARY ANGIOGRAM N/A 09/30/2011   Procedure: LEFT HEART CATHETERIZATION WITH CORONARY ANGIOGRAM;  Surgeon: Jettie Booze, MD;  Location: [facility] CATH LAB;  Service: Cardiovascular;  Laterality: N/A;  possible PCI  . LEFT HEART CATHETERIZATION WITH CORONARY ANGIOGRAM N/A 11/15/2011   Procedure: LEFT HEART CATHETERIZATION WITH  CORONARY ANGIOGRAM;  Surgeon: Jettie Booze, MD;  Location: [facility] CATH LAB;  Service: Cardiovascular;  Laterality: N/A;  . PERCUTANEOUS CORONARY STENT INTERVENTION (PCI-S)  09/30/2011   Procedure: PERCUTANEOUS CORONARY STENT INTERVENTION (PCI-S);  Surgeon: Jettie Booze, MD;  Location: [facility] CATH LAB;  Service: Cardiovascular;;  . PERCUTANEOUS CORONARY STENT INTERVENTION (PCI-S) N/A 11/11/2011   Procedure: PERCUTANEOUS CORONARY STENT INTERVENTION (PCI-S);  Surgeon: Jettie Booze, MD;  Location: [facility] CATH LAB;  Service: Cardiovascular;  Laterality: N/A;  . SHOULDER SURGERY Right    X 2  . TONSILLECTOMY  ~ 1948  . TOTAL KNEE ARTHROPLASTY Left 07/23/2016   Procedure: LEFT TOTAL KNEE ARTHROPLASTY;  Surgeon: Marybelle Killings, MD;  Location: [facility];  Service: Orthopedics;  Laterality: Left;   Social History   Occupational History  . Occupation: Retired  Tobacco Use  . Smoking status: Former Research scientist (life sciences)  . Smokeless tobacco: Current User    Types: Chew  Substance and Sexual Activity  . Alcohol use: No  . Drug use: No  . Sexual activity: Not on file

## 2018-04-27 DIAGNOSIS — E538 Deficiency of other specified B group vitamins: Secondary | ICD-10-CM | POA: Diagnosis not present

## 2018-04-28 ENCOUNTER — Ambulatory Visit (INDEPENDENT_AMBULATORY_CARE_PROVIDER_SITE_OTHER): Payer: Medicare Other | Admitting: Orthopaedic Surgery

## 2018-05-23 ENCOUNTER — Ambulatory Visit (INDEPENDENT_AMBULATORY_CARE_PROVIDER_SITE_OTHER): Payer: Medicare Other

## 2018-05-23 ENCOUNTER — Ambulatory Visit (INDEPENDENT_AMBULATORY_CARE_PROVIDER_SITE_OTHER): Payer: Medicare Other | Admitting: Orthopaedic Surgery

## 2018-05-23 ENCOUNTER — Encounter (INDEPENDENT_AMBULATORY_CARE_PROVIDER_SITE_OTHER): Payer: Self-pay | Admitting: Orthopaedic Surgery

## 2018-05-23 VITALS — BP 155/68 | HR 64 | Ht 70.0 in | Wt 190.0 lb

## 2018-05-23 DIAGNOSIS — M25561 Pain in right knee: Secondary | ICD-10-CM | POA: Diagnosis not present

## 2018-05-23 DIAGNOSIS — S73014D Posterior dislocation of right hip, subsequent encounter: Secondary | ICD-10-CM

## 2018-05-23 DIAGNOSIS — I2510 Atherosclerotic heart disease of native coronary artery without angina pectoris: Secondary | ICD-10-CM

## 2018-05-23 NOTE — Progress Notes (Signed)
Office Visit Note   Patient: Keith Espinoza           Date of Birth: [birth year]           MRN: [id number] Visit Date: 05/23/2018              Requested by: [name], MD [address] [place], [place] [address] PCP: [name], MD   Assessment & Plan: Visit Diagnoses:  1. Posterior dislocation of right hip, subsequent encounter   2. Acute pain of right knee     Plan: Patient will work on ambulation with a cane.  Hip precautions reviewed.  He will return if he has increased problems with his knee.  He understands that after 6 weeks in a knee immobilizer take a little bit of time to loosen up his right knee but it was not giving him problems prior to the hip dislocation.  Follow-Up Instructions: No follow-ups on file.   Orders:  Orders Placed This Encounter  Procedures  . XR Pelvis 1-2 Views  . XR Knee 1-2 Views Right   No orders of the defined types were placed in this encounter.     Procedures: No procedures performed   Clinical Data: No additional findings.   Subjective: Chief Complaint  Patient presents with  . Right Hip - Follow-up, Pain  . Right Knee - Follow-up, Pain    HPI 77 year old male returns 6 weeks post right total hip arthroplasty dislocation.  Right total hip was done 2003.  He can discontinue the knee immobilizer he has problems with stiffness in his knee where x-rays demonstrate chondrocalcinosis of the meniscus.  Left total knee arthroplasty is doing well.  He can work on loosening his knee up we discussed hip precautions and avoiding hip flexion and internal rotation.  Review of Systems updated unchanged from last office visit.   Objective: Vital Signs: BP (!) 155/68   Pulse 64   Ht 5\' 10"  (1.778 m)   Wt 190 lb (86.2 kg)   BMI 27.26 kg/m   Physical Exam  Constitutional: He is oriented to person, place, and time. He appears well-developed and well-nourished.  HENT:  Head: Normocephalic and atraumatic.  Eyes: Pupils  are equal, round, and reactive to light. EOM are normal.  Neck: No tracheal deviation present. No thyromegaly present.  Cardiovascular: Normal rate.  Pulmonary/Chest: Effort normal. He has no wheezes.  Abdominal: Soft. Bowel sounds are normal.  Neurological: He is alert and oriented to person, place, and time.  Skin: Skin is warm and dry. Capillary refill takes less than 2 seconds.  Psychiatric: He has a normal mood and affect. His behavior is normal. Judgment and thought content normal.    Ortho Exam patient has stiffness of his right knee has full extension flexes to about 50 degrees after 6 weeks in a knee immobilizer.  No knee effusion no cellulitis.  Leg lengths are equal.  Specialty Comments:  No specialty comments available.  Imaging: No results found.   PMFS History: Patient Active Problem List   Diagnosis Date Noted  . Posterior dislocation of right hip ([place]) 04/25/2018  . Burn, foot, second degree, left, initial encounter 06/08/2017  . 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 S/P total knee arthroplasty, left 10/26/2016  . Hyperlipidemia 09/04/2014  . Thrombocytopenia ([place])   . Precordial chest pain 04/05/2014  . Coronary atherosclerosis of native coronary artery 10/01/2013  . Other and unspecified hyperlipidemia 10/01/2013  . Essential hypertension, benign  10/01/2013  . Insulin dependent type 2 diabetes mellitus ([place]) 10/01/2013  . Esophageal reflux 10/01/2013  . Hypertrophy of prostate without urinary obstruction and other lower urinary tract symptoms (LUTS) 10/01/2013   Past Medical History:  Diagnosis Date  . Allergic rhinitis   . Allergic rhinitis   . Arthritis   . Chronic leg pain    right  . Chronic lower back pain   . Coronary artery disease    a. Stenting to RCA 2004; staged DES to LAD and Cx 2004. DES to mRCA 2012. b. DES to mCx, PTCA to dCx 11/2011. c. Lateral wall MI 2013 s/p PTCA to distal Cx & DES to mid OM2 11/2011. d. Low  risk nuc 04/2014, EF wnl.  . Diabetes mellitus    Insulin dependent  . Diabetic neuropathy (HCC)    MILD  . Diverticulosis   . Dysrhythmia   . Gilbert syndrome   . Gout    right wrist; right foot; right elbow; have had it since 1970's  . H/O hiatal hernia   . Heart murmur   . History of echocardiogram    aortic sclerosis per echo 12/09 EF 65%, otherwise normal  . History of hemorrhoids    BLEEDING  . Hypertension    Diagnosed 1995   . Pancreatic pseudocyst    a. s/p remote drainage 2006.  Marland Kitchen Thrombocytopenia ([place])    Seen on oldest labs in system from 2004  . Vitamin B 12 deficiency    orally replaced    Family History  Problem Relation Age of Onset  . Diabetes Mother   . Hyperlipidemia Mother   . Hypertension Mother   . Cancer Father   . Hypertension Father   . Diabetes Sister   . Hypertension Sister   . Cancer Brother   . Heart attack Neg Hx     Past Surgical History:  Procedure Laterality Date  . BACK SURGERY     "total of 3 times" S/P fall   . CARPAL TUNNEL RELEASE Bilateral   . CHOLECYSTECTOMY  1990's  . COLONOSCOPY    . CORONARY ANGIOPLASTY  11/11/11  . CORONARY ANGIOPLASTY WITH STENT PLACEMENT  09/30/2011   "1 then; makes a total of 4"  . CORONARY ANGIOPLASTY WITH STENT PLACEMENT  11/11/11   "1; makes a total of 5"  . INGUINAL HERNIA REPAIR  2003   right  . JOINT REPLACEMENT Right 04/03/2002   hip replacment  . KNEE ARTHROSCOPY  1990's   left  . LEFT HEART CATHETERIZATION WITH CORONARY ANGIOGRAM N/A 09/30/2011   Procedure: LEFT HEART CATHETERIZATION WITH CORONARY ANGIOGRAM;  Surgeon: Jettie Booze, MD;  Location[facility] C[facility] LAB;  Service: Cardiovascular;  Laterality: N/A;  possible PCI  . LEFT HEART CATHETERIZATION WITH CORONARY ANGIOGRAM N/A 11/15/2011   Procedure: LEFT HEART CATHETERIZATION WITH CORONARY ANGIOGRAM;  Surgeon: Jettie Booze, MD;  Location[facility] CATH LAB;  Service: Cardiovascular;  Laterality: N/A;  . PERCUTANEOUS CORONARY STENT  INTERVENTION (PCI-S)  09/30/2011   Procedure: PERCUTANEOUS CORONARY STENT INTERVENTION (PCI-S);  Surgeon: Jettie Booze, MD;  Location: [facility] CATH LAB;  Service: Cardiovascular;;  . PERCUTANEOUS CORONARY STENT INTERVENTION (PCI-S) N/A 11/11/2011   Procedure: PERCUTANEOUS CORONARY STENT INTERVENTION (PCI-S);  Surgeon: Jettie Booze, MD;  Location: [facility] CATH LAB;  Service: Cardiovascular;  Laterality: N/A;  . SHOULDER SURGERY Right    X 2  . TONSILLECTOMY  ~ 1948  . TOTAL KNEE ARTHROPLASTY Left 07/23/2016   Procedure: LEFT TOTAL KNEE ARTHROPLASTY;  Surgeon: Marybelle Killings, MD;  Location: [place];  Service: Orthopedics;  Laterality: Left;   Social History   Occupational History  . Occupation: Retired  Tobacco Use  . Smoking status: Former Research scientist (life sciences)  . Smokeless tobacco: Current User    Types: Chew  Substance and Sexual Activity  . Alcohol use: No  . Drug use: No  . Sexual activity: Not on file

## 2018-05-29 DIAGNOSIS — D81818 Other biotin-dependent carboxylase deficiency: Secondary | ICD-10-CM | POA: Diagnosis not present

## 2018-06-02 ENCOUNTER — Ambulatory Visit (INDEPENDENT_AMBULATORY_CARE_PROVIDER_SITE_OTHER): Payer: Medicare Other | Admitting: Orthopaedic Surgery

## 2018-06-02 ENCOUNTER — Encounter (INDEPENDENT_AMBULATORY_CARE_PROVIDER_SITE_OTHER): Payer: Self-pay | Admitting: Orthopaedic Surgery

## 2018-06-02 VITALS — BP 135/72 | HR 76 | Ht 70.0 in | Wt 190.0 lb

## 2018-06-02 DIAGNOSIS — S73014D Posterior dislocation of right hip, subsequent encounter: Secondary | ICD-10-CM

## 2018-06-02 DIAGNOSIS — M659 Synovitis and tenosynovitis, unspecified: Secondary | ICD-10-CM | POA: Diagnosis not present

## 2018-06-02 DIAGNOSIS — I2510 Atherosclerotic heart disease of native coronary artery without angina pectoris: Secondary | ICD-10-CM | POA: Diagnosis not present

## 2018-06-02 MED ORDER — BUPIVACAINE HCL 0.25 % IJ SOLN
4.0000 mL | INTRAMUSCULAR | Status: AC | PRN
  Administered 2018-06-02: 4 mL via INTRA_ARTICULAR

## 2018-06-02 MED ORDER — METHYLPREDNISOLONE ACETATE 40 MG/ML IJ SUSP
40.0000 mg | INTRAMUSCULAR | Status: AC | PRN
  Administered 2018-06-02: 40 mg via INTRA_ARTICULAR

## 2018-06-02 MED ORDER — LIDOCAINE HCL 1 % IJ SOLN
0.5000 mL | INTRAMUSCULAR | Status: AC | PRN
  Administered 2018-06-02: .5 mL

## 2018-06-02 NOTE — Progress Notes (Signed)
Office Visit Note   Patient: Keith Espinoza           Date of Birth: 08/23/1941           MRN: [id number] Visit Date: 06/02/2018              Requested by: [name], MD [address] [place],  [address] PCP: [name], MD   Assessment & Plan: Visit Diagnoses:  1. Posterior dislocation of right hip, subsequent encounter   2. Synovitis of right knee   3.  Pain post hip reduction 7 wks ago  Plan: Injection performed right knee.  He uses a sliding scale at night with Levemir insulin and can watch his sugars and increase his insulin as needed.  I plan to recheck him in 3 weeks.  X-rays last year and after his hip reduction shows no evidence of loosening or eccentric poly-wear.  No bone resorption negative for acute fracture.  Follow-Up Instructions: No follow-ups on file.   Orders:  Orders Placed This Encounter  Procedures  . Large Joint Inj   No orders of the defined types were placed in this encounter.     Procedures: Large Joint Inj: R knee on 06/02/2018 1:30 PM Indications: pain and joint swelling Details: 22 G 1.5 in needle, anterolateral approach  Arthrogram: No  Medications: 40 mg methylPREDNISolone acetate 40 MG/ML; 0.5 mL lidocaine 1 %; 4 mL bupivacaine 0.25 % Outcome: tolerated well, no immediate complications Procedure, treatment alternatives, risks and benefits explained, specific risks discussed. Consent was given by the patient. Immediately prior to procedure a time out was called to verify the correct patient, procedure, equipment, support staff and site/side marked as required. Patient was prepped and draped in the usual sterile fashion.       Clinical Data: No additional findings.   Subjective: Chief Complaint  Patient presents with  . Right Hip - Pain  . Right Leg - Pain    HPI 77 year old male returns post total hip arthroplasty and 2003 by Dr.Krege with first-time hip dislocation on 04/20/2018 reduced in the emergency  room.  He still having aching pain in his hip pain with ambulation and is also having problems with right knee swelling of effusion and pain.  X-rays of his knee demonstrated some arthritis with chondrocalcinosis of the meniscus.  Total knee arthroplasty on the left side is doing well.  His pain is better when he sitting is worse with more ambulation.  Denies fever or chills.  No swelling in his ankles or feet.  Review of Systems updated 04/25/2018 office visit and unchanged 14 point system update other than as mentioned in HPI.   Objective: Vital Signs: BP 135/72   Pulse 76   Ht 5\' 10"  (1.778 m)   Wt 190 lb (86.2 kg)   BMI 27.26 kg/m   Physical Exam  Constitutional: He is oriented to person, place, and time. He appears well-developed and well-nourished.  HENT:  Head: Normocephalic and atraumatic.  Eyes: Pupils are equal, round, and reactive to light. EOM are normal.  Neck: No tracheal deviation present. No thyromegaly present.  Cardiovascular: Normal rate.  Pulmonary/Chest: Effort normal. He has no wheezes.  Abdominal: Soft. Bowel sounds are normal.  Neurological: He is alert and oriented to person, place, and time.  Skin: Skin is warm and dry. Capillary refill takes less than 2 seconds.  Psychiatric: He has a normal mood and affect. His behavior is normal. Judgment and thought content normal.  Ortho Exam hip incision is well-healed.  He has 20 degrees internal/external rotation without significant pain.  Collateral ligaments are stable in his knee he has 2-3+ knee effusion without cellulitis.  Well-healed left total knee arthroplasty incision.  Pedal pulse palpable.  No pitting edema negative Homan negative straight leg raising 90 degrees.  Specialty Comments:  No specialty comments available.  Imaging: No results found.   PMFS History: Patient Active Problem List   Diagnosis Date Noted  . Posterior dislocation of right hip ([place]) 04/25/2018  . Burn, foot, second degree,  left, initial encounter 06/08/2017  . 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 S/P total knee arthroplasty, left 10/26/2016  . Hyperlipidemia 09/04/2014  . Thrombocytopenia ([place])   . Precordial chest pain 04/05/2014  . Coronary atherosclerosis of native coronary artery 10/01/2013  . Other and unspecified hyperlipidemia 10/01/2013  . Essential hypertension, benign 10/01/2013  . Insulin dependent type 2 diabetes mellitus ([place]) 10/01/2013  . Esophageal reflux 10/01/2013  . Hypertrophy of prostate without urinary obstruction and other lower urinary tract symptoms (LUTS) 10/01/2013   Past Medical History:  Diagnosis Date  . Allergic rhinitis   . Allergic rhinitis   . Arthritis   . Chronic leg pain    right  . Chronic lower back pain   . Coronary artery disease    a. Stenting to RCA 2004; staged DES to LAD and Cx 2004. DES to mRCA 2012. b. DES to mCx, PTCA to dCx 11/2011. c. Lateral wall MI 2013 s/p PTCA to distal Cx & DES to mid OM2 11/2011. d. Low risk nuc 04/2014, EF wnl.  . Diabetes mellitus    Insulin dependent  . Diabetic neuropathy (HCC)    MILD  . Diverticulosis   . Dysrhythmia   . Gilbert syndrome   . Gout    right wrist; right foot; right elbow; have had it since 1970's  . H/O hiatal hernia   . Heart murmur   . History of echocardiogram    aortic sclerosis per echo 12/09 EF 65%, otherwise normal  . History of hemorrhoids    BLEEDING  . Hypertension    Diagnosed 1995   . Pancreatic pseudocyst    a. s/p remote drainage 2006.  Marland Kitchen Thrombocytopenia ([place])    Seen on oldest labs in system from 2004  . Vitamin B 12 deficiency    orally replaced    Family History  Problem Relation Age of Onset  . Diabetes Mother   . Hyperlipidemia Mother   . Hypertension Mother   . Cancer Father   . Hypertension Father   . Diabetes Sister   . Hypertension Sister   . Cancer Brother   . Heart attack Neg Hx     Past Surgical History:  Procedure Laterality Date    . BACK SURGERY     "total of 3 times" S/P fall   . CARPAL TUNNEL RELEASE Bilateral   . CHOLECYSTECTOMY  1990's  . COLONOSCOPY    . CORONARY ANGIOPLASTY  11/11/11  . CORONARY ANGIOPLASTY WITH STENT PLACEMENT  09/30/2011   "1 then; makes a total of 4"  . CORONARY ANGIOPLASTY WITH STENT PLACEMENT  11/11/11   "1; makes a total of 5"  . INGUINAL HERNIA REPAIR  2003   right  . JOINT REPLACEMENT Right 04/03/2002   hip replacment  . KNEE ARTHROSCOPY  1990's   left  . LEFT HEART CATHETERIZATION WITH CORONARY ANGIOGRAM N/A 09/30/2011   Procedure: LEFT HEART CATHETERIZATION WITH CORONARY  ANGIOGRAM;  Surgeon: Jettie Booze, MD;  Location: [facility] CATH LAB;  Service: Cardiovascular;  Laterality: N/A;  possible PCI  . LEFT HEART CATHETERIZATION WITH CORONARY ANGIOGRAM N/A 11/15/2011   Procedure: LEFT HEART CATHETERIZATION WITH CORONARY ANGIOGRAM;  Surgeon: Jettie Booze, MD;  Location[facility] CATH LAB;  Service: Cardiovascular;  Laterality: N/A;  . PERCUTANEOUS CORONARY STENT INTERVENTION (PCI-S)  09/30/2011   Procedure: PERCUTANEOUS CORONARY STENT INTERVENTION (PCI-S);  Surgeon: Jettie Booze, MD;  Location: [facility] CATH LAB;  Service: Cardiovascular;;  . PERCUTANEOUS CORONARY STENT INTERVENTION (PCI-S) N/A 11/11/2011   Procedure: PERCUTANEOUS CORONARY STENT INTERVENTION (PCI-S);  Surgeon: Jettie Booze, MD;  Location: [facility] CATH LAB;  Service: Cardiovascular;  Laterality: N/A;  . SHOULDER SURGERY Right    X 2  . TONSILLECTOMY  ~ 1948  . TOTAL KNEE ARTHROPLASTY Left 07/23/2016   Procedure: LEFT TOTAL KNEE ARTHROPLASTY;  Surgeon: Marybelle Killings, MD;  Location: [place];  Service: Orthopedics;  Laterality: Left;   Social History   Occupational History  . Occupation: Retired  Tobacco Use  . Smoking status: Former Research scientist (life sciences)  . Smokeless tobacco: Current User    Types: Chew  Substance and Sexual Activity  . Alcohol use: No  . Drug use: No  . Sexual activity: Not on file

## 2018-06-06 ENCOUNTER — Ambulatory Visit (INDEPENDENT_AMBULATORY_CARE_PROVIDER_SITE_OTHER): Payer: Medicare Other | Admitting: Orthopaedic Surgery

## 2018-06-13 ENCOUNTER — Telehealth (INDEPENDENT_AMBULATORY_CARE_PROVIDER_SITE_OTHER): Payer: Self-pay | Admitting: Orthopaedic Surgery

## 2018-06-13 ENCOUNTER — Telehealth (INDEPENDENT_AMBULATORY_CARE_PROVIDER_SITE_OTHER): Payer: Self-pay

## 2018-06-13 ENCOUNTER — Encounter (HOSPITAL_COMMUNITY): Payer: Self-pay | Admitting: Internal Medicine

## 2018-06-13 ENCOUNTER — Emergency Department (HOSPITAL_COMMUNITY): Payer: Medicare Other

## 2018-06-13 ENCOUNTER — Emergency Department (HOSPITAL_COMMUNITY)
Admission: EM | Admit: 2018-06-13 | Discharge: 2018-06-13 | Disposition: A | Discharge status: Home / Self Care | Payer: Medicare Other | Attending: Emergency Medicine | Admitting: Emergency Medicine

## 2018-06-13 DIAGNOSIS — M25551 Pain in right hip: Secondary | ICD-10-CM | POA: Diagnosis not present

## 2018-06-13 DIAGNOSIS — Z96641 Presence of right artificial hip joint: Secondary | ICD-10-CM | POA: Diagnosis not present

## 2018-06-13 DIAGNOSIS — Z471 Aftercare following joint replacement surgery: Secondary | ICD-10-CM | POA: Diagnosis not present

## 2018-06-13 DIAGNOSIS — Z96652 Presence of left artificial knee joint: Secondary | ICD-10-CM | POA: Diagnosis not present

## 2018-06-13 DIAGNOSIS — Y92009 Unspecified place in unspecified non-institutional (private) residence as the place of occurrence of the external cause: Secondary | ICD-10-CM | POA: Insufficient documentation

## 2018-06-13 DIAGNOSIS — Z049 Encounter for examination and observation for unspecified reason: Secondary | ICD-10-CM | POA: Diagnosis not present

## 2018-06-13 DIAGNOSIS — W19XXXA Unspecified fall, initial encounter: Secondary | ICD-10-CM | POA: Insufficient documentation

## 2018-06-13 DIAGNOSIS — S73014D Posterior dislocation of right hip, subsequent encounter: Secondary | ICD-10-CM | POA: Diagnosis not present

## 2018-06-13 DIAGNOSIS — I2510 Atherosclerotic heart disease of native coronary artery without angina pectoris: Secondary | ICD-10-CM | POA: Insufficient documentation

## 2018-06-13 DIAGNOSIS — E119 Type 2 diabetes mellitus without complications: Secondary | ICD-10-CM | POA: Diagnosis not present

## 2018-06-13 DIAGNOSIS — Y939 Activity, unspecified: Secondary | ICD-10-CM | POA: Diagnosis not present

## 2018-06-13 DIAGNOSIS — I493 Ventricular premature depolarization: Secondary | ICD-10-CM | POA: Diagnosis not present

## 2018-06-13 DIAGNOSIS — Z79899 Other long term (current) drug therapy: Secondary | ICD-10-CM | POA: Diagnosis not present

## 2018-06-13 DIAGNOSIS — S73004A Unspecified dislocation of right hip, initial encounter: Secondary | ICD-10-CM | POA: Insufficient documentation

## 2018-06-13 DIAGNOSIS — M24451 Recurrent dislocation, right hip: Secondary | ICD-10-CM | POA: Diagnosis present

## 2018-06-13 DIAGNOSIS — T84020A Dislocation of internal right hip prosthesis, initial encounter: Secondary | ICD-10-CM | POA: Diagnosis not present

## 2018-06-13 DIAGNOSIS — Y999 Unspecified external cause status: Secondary | ICD-10-CM | POA: Insufficient documentation

## 2018-06-13 DIAGNOSIS — S73014A Posterior dislocation of right hip, initial encounter: Secondary | ICD-10-CM

## 2018-06-13 DIAGNOSIS — I10 Essential (primary) hypertension: Secondary | ICD-10-CM | POA: Insufficient documentation

## 2018-06-13 DIAGNOSIS — Z7984 Long term (current) use of oral hypoglycemic drugs: Secondary | ICD-10-CM | POA: Insufficient documentation

## 2018-06-13 LAB — BASIC METABOLIC PANEL
Anion gap: 10 (ref 5–15)
BUN: 14 mg/dL (ref 8–23)
CO2: 25 mmol/L (ref 22–32)
Calcium: 9 mg/dL (ref 8.9–10.3)
Chloride: 107 mmol/L (ref 98–111)
Creatinine, Ser: 1.05 mg/dL (ref 0.61–1.24)
GFR calc Af Amer: >60 mL/min (ref >60)
GFR calc non Af Amer: >60 mL/min (ref >60)
Glucose, Bld: 162 mg/dL — ABNORMAL HIGH (ref 70–99)
Potassium: 3.4 mmol/L — ABNORMAL LOW (ref 3.5–5.1)
Sodium: 142 mmol/L (ref 135–145)

## 2018-06-13 LAB — CBC
HCT: 37.8 % — ABNORMAL LOW (ref 39.0–52.0)
Hemoglobin: 13.1 g/dL (ref 13.0–17.0)
MCH: 31.6 pg (ref 26.0–34.0)
MCHC: 34.7 g/dL (ref 30.0–36.0)
MCV: 91.3 fL (ref 78.0–100.0)
Platelets: 132 10*3/uL — ABNORMAL LOW (ref 150–400)
RBC: 4.14 MIL/uL — ABNORMAL LOW (ref 4.22–5.81)
RDW: 14.2 % (ref 11.5–15.5)
WBC: 7.9 10*3/uL (ref 4.0–10.5)

## 2018-06-13 MED ORDER — FENTANYL CITRATE (PF) 100 MCG/2ML IJ SOLN
100.0000 ug | Freq: Once | INTRAMUSCULAR | Status: AC
  Administered 2018-06-13: 100 ug via INTRAVENOUS
  Filled 2018-06-13: qty 2

## 2018-06-13 MED ORDER — SODIUM CHLORIDE 0.9 % IV SOLN
INTRAVENOUS | Status: DC
  Administered 2018-06-13: 10:00:00 via INTRAVENOUS

## 2018-06-13 MED ORDER — ONDANSETRON HCL 4 MG/2ML IJ SOLN
4.0000 mg | Freq: Once | INTRAMUSCULAR | Status: AC
  Administered 2018-06-13: 4 mg via INTRAVENOUS
  Filled 2018-06-13: qty 2

## 2018-06-13 MED ORDER — PROPOFOL 10 MG/ML IV BOLUS
0.5000 mg/kg | Freq: Once | INTRAVENOUS | Status: AC
  Administered 2018-06-13: 40 mg via INTRAVENOUS
  Filled 2018-06-13: qty 20

## 2018-06-13 NOTE — Consult Note (Signed)
Reason for Consult:Right hip dislocation Referring Physician: Archit Espinoza is an 77 y.o. male.  HPI: Keith Espinoza was bending over to secure the velcro on his shoes when he felt his hip start to come out of joint. He tried to sit up quickly to stop it but it was too late. He was brought to the ED where x-rays confirmed the dislocation and orthopedic surgery was consulted. This is his second dislocation in under 2 months.  Past Medical History:  Diagnosis Date  . Allergic rhinitis   . Allergic rhinitis   . Arthritis   . Chronic leg pain    right  . Chronic lower back pain   . Coronary artery disease    a. Stenting to RCA 2004; staged DES to LAD and Cx 2004. DES to mRCA 2012. b. DES to mCx, PTCA to dCx 11/2011. c. Lateral wall MI 2013 s/p PTCA to distal Cx & DES to mid OM2 11/2011. d. Low risk nuc 04/2014, EF wnl.  . Diabetes mellitus    Insulin dependent  . Diabetic neuropathy (HCC)    MILD  . Diverticulosis   . Dysrhythmia   . Gilbert syndrome   . Gout    right wrist; right foot; right elbow; have had it since 1970's  . H/O hiatal hernia   . Heart murmur   . History of echocardiogram    aortic sclerosis per echo 12/09 EF 65%, otherwise normal  . History of hemorrhoids    BLEEDING  . Hypertension    Diagnosed 1995   . Pancreatic pseudocyst    a. s/p remote drainage 2006.  Marland Kitchen Thrombocytopenia ([place])    Seen on oldest labs in system from 2004  . Vitamin B 12 deficiency    orally replaced    Past Surgical History:  Procedure Laterality Date  . BACK SURGERY     "total of 3 times" S/P fall   . CARPAL TUNNEL RELEASE Bilateral   . CHOLECYSTECTOMY  1990's  . COLONOSCOPY    . CORONARY ANGIOPLASTY  11/11/11  . CORONARY ANGIOPLASTY WITH STENT PLACEMENT  09/30/2011   "1 then; makes a total of 4"  . CORONARY ANGIOPLASTY WITH STENT PLACEMENT  11/11/11   "1; makes a total of 5"  . INGUINAL HERNIA REPAIR  2003   right  . JOINT REPLACEMENT Right 04/03/2002   hip replacment  . KNEE  ARTHROSCOPY  1990's   left  . LEFT HEART CATHETERIZATION WITH CORONARY ANGIOGRAM N/A 09/30/2011   Procedure: LEFT HEART CATHETERIZATION WITH CORONARY ANGIOGRAM;  Surgeon: Jettie Booze, MD;  Location: [facility] CATH LAB;  Service: Cardiovascular;  Laterality: N/A;  possible PCI  . LEFT HEART CATHETERIZATION WITH CORONARY ANGIOGRAM N/A 11/15/2011   Procedure: LEFT HEART CATHETERIZATION WITH CORONARY ANGIOGRAM;  Surgeon: Jettie Booze, MD;  Location: [facility] CATH LAB;  Service: Cardiovascular;  Laterality: N/A;  . PERCUTANEOUS CORONARY STENT INTERVENTION (PCI-S)  09/30/2011   Procedure: PERCUTANEOUS CORONARY STENT INTERVENTION (PCI-S);  Surgeon: Jettie Booze, MD;  Location: [facility] CATH LAB;  Service: Cardiovascular;;  . PERCUTANEOUS CORONARY STENT INTERVENTION (PCI-S) N/A 11/11/2011   Procedure: PERCUTANEOUS CORONARY STENT INTERVENTION (PCI-S);  Surgeon: Jettie Booze, MD;  Location: [facility] CATH LAB;  Service: Cardiovascular;  Laterality: N/A;  . SHOULDER SURGERY Right    X 2  . TONSILLECTOMY  ~ 1948  . TOTAL KNEE ARTHROPLASTY Left 07/23/2016   Procedure: LEFT TOTAL KNEE ARTHROPLASTY;  Surgeon: Marybelle Killings, MD;  Location: [place];  Service:  Orthopedics;  Laterality: Left;    Family History  Problem Relation Age of Onset  . Diabetes Mother   . Hyperlipidemia Mother   . Hypertension Mother   . Cancer Father   . Hypertension Father   . Diabetes Sister   . Hypertension Sister   . Cancer Brother   . Heart attack Neg Hx     Social History:  reports that he has quit smoking. His smokeless tobacco use includes chew. He reports that he does not drink alcohol or use drugs.  Allergies:  Allergies  Allergen Reactions  . Simvastatin Other (See Comments)    SEVERE MYALGIAS   . Zetia [Ezetimibe] Other (See Comments)    MYALGIAS  . Dilaudid [Hydromorphone Hcl]     hallucination  . Fish Oil Nausea Only    Medications: I have reviewed the patient's current medications.  Results for orders  placed or performed during the hospital encounter of 06/13/18 (from the past 48 hour(s))  Basic metabolic panel     Status: Abnormal   Collection Time: 06/13/18 10:27 AM  Result Value Ref Range   Sodium 142 135 - 145 mmol/L   Potassium 3.4 (L) 3.5 - 5.1 mmol/L   Chloride 107 98 - 111 mmol/L   CO2 25 22 - 32 mmol/L   Glucose, Bld 162 (H) 70 - 99 mg/dL   BUN 14 8 - 23 mg/dL   Creatinine, Ser 1.05 0.61 - 1.24 mg/dL   Calcium 9.0 8.9 - 10.3 mg/dL   GFR calc non Af Amer >60 >60 mL/min   GFR calc Af Amer >60 >60 mL/min    Comment: (NOTE) The eGFR has been calculated using the CKD EPI equation. This calculation has not been validated in all clinical situations. eGFR's persistently <60 mL/min signify possible Chronic Kidney Disease.    Anion gap 10 5 - 15    Comment: Performed at [facility] [address], [place], [place] [address]  CBC     Status: Abnormal   Collection Time: 06/13/18 10:27 AM  Result Value Ref Range   WBC 7.9 4.0 - 10.5 K/uL   RBC 4.14 (L) 4.22 - 5.81 MIL/uL   Hemoglobin 13.1 13.0 - 17.0 g/dL   HCT 37.8 (L) 39.0 - 52.0 %   MCV 91.3 78.0 - 100.0 fL   MCH 31.6 26.0 - 34.0 pg   MCHC 34.7 30.0 - 36.0 g/dL   RDW 14.2 11.5 - 15.5 %   Platelets 132 (L) 150 - 400 K/uL    Comment: Performed at [facility], [place] [address], [place], [place] [address]    Dg Hip Malvin Johns Or Wo Pelvis 2-3 Views Right  Result Date: 06/13/2018 CLINICAL DATA:  Right hip pain EXAM: DG HIP (WITH OR WITHOUT PELVIS) 2-3V RIGHT COMPARISON:  None. FINDINGS: Right total hip arthroplasty. No acute fracture. Posterior right hip dislocation. No left hip fracture or dislocation. Generalized osteopenia. IMPRESSION: Right total hip arthroplasty with posterior dislocation. Electronically Signed   By: Kathreen Devoid   On: 06/13/2018 10:14    Review of Systems  Constitutional: Negative for weight loss.  HENT: Negative for ear discharge, ear pain, hearing loss and tinnitus.   Eyes: Negative for  blurred vision, double vision, photophobia and pain.  Respiratory: Negative for cough, sputum production and shortness of breath.   Cardiovascular: Negative for chest pain.  Gastrointestinal: Negative for abdominal pain, nausea and vomiting.  Genitourinary: Negative for dysuria, flank pain, frequency and urgency.  Musculoskeletal:  Positive for joint pain (Right hip). Negative for back pain, falls, myalgias and neck pain.  Neurological: Negative for dizziness, tingling, sensory change, focal weakness, loss of consciousness and headaches.  Endo/Heme/Allergies: Does not bruise/bleed easily.  Psychiatric/Behavioral: Negative for depression, memory loss and substance abuse. The patient is not nervous/anxious.    Blood pressure (!) 176/82, pulse 73, temperature (!) 97.5 F (36.4 C), temperature source Oral, resp. rate 20, height '5\' 10"'  (1.778 m), weight 86.2 kg, SpO2 100 %. Physical Exam  Constitutional: He appears well-developed and well-nourished. No distress.  HENT:  Head: Normocephalic and atraumatic.  Eyes: Conjunctivae are normal. Right eye exhibits no discharge. Left eye exhibits no discharge. No scleral icterus.  Neck: Normal range of motion.  Cardiovascular: Normal rate and regular rhythm.  Respiratory: Effort normal. No respiratory distress.  Musculoskeletal:  LLE No traumatic wounds, ecchymosis, or rash  TTP hip, leg shortened and internally rotated  No knee or ankle effusion  Knee stable to varus/ valgus and anterior/posterior stress  Sens DPN, SPN, TN intact  Motor EHL, ext, flex, evers 5/5  DP 2+, PT 1+, No significant edema  Neurological: He is alert.  Skin: Skin is warm and dry. He is not diaphoretic.  Psychiatric: He has a normal mood and affect. His behavior is normal.    Assessment/Plan: Right hip dislocation -- Plan for CR in ED under CS, then hip precautions and KI. He should f/u with Dr. Lorin Mercy in office.    Lisette Abu, PA-C Orthopedic  Surgery [phone] 06/13/2018, 12:18 PM

## 2018-06-13 NOTE — Telephone Encounter (Signed)
FYI  I called and spoke with Keith Espinoza. [name] bent over to put his shoe on and his hip popped out again. He is on the way to [facility] ED.  I called and spoke with Dr. Lorin Mercy who is going to call [place] back.

## 2018-06-13 NOTE — ED Notes (Signed)
Patient verbalizes understanding of discharge instructions. Opportunity for questioning and answers were provided. Armband removed by staff, pt discharged from ED.  

## 2018-06-13 NOTE — Procedures (Addendum)
Procedure: Right hip closed reduction  Indication: Right hip dislocation  Surgeon: Silvestre Gunner, PA-C  Assist: None  Anesthesia: Propofol for conscious sedation via EDP  EBL: None  Complications: None  Findings: After risks/benefits explained patient desires to undergo procedure. Consent obtained and time out performed. Proprofol was given and hip was reduced. Post-reduction x-ray showed good position. Pt tolerated the procedure well.    Lisette Abu, PA-C Orthopedic Surgery [phone]  Discussed technique for reduction with Hilbert Odor PA-C . Hip was reduced. xrays reviewed. He will followup with me in one week and keep knee immobilizer on at all times. Patients daughter called by me on her cell and discussed.

## 2018-06-13 NOTE — Progress Notes (Signed)
Orthopedic Tech Progress Note Patient Details:  Keith Espinoza Sep 26, 1941 [id number]  Ortho Devices Type of Ortho Device: Knee Immobilizer Ortho Device/Splint Location: rle Ortho Device/Splint Interventions: Application   Post Interventions Patient Tolerated: Well Instructions Provided: Care of device   Hildred Priest 06/13/2018, 1:56 PM

## 2018-06-13 NOTE — ED Notes (Signed)
Portable xray @bedside

## 2018-06-13 NOTE — ED Provider Notes (Signed)
[place] EMERGENCY DEPARTMENT Provider Note   CSN: [id number] Arrival date & time: 06/13/18  0901     History   Chief Complaint Chief Complaint  Patient presents with  . Hip Injury    HPI Keith Espinoza is a 77 y.o. male.  Patient brought in by EMS.  Patient with probable right hip dislocation.  Patient felt his right hip dislocate while he is attempting to put on his shoes at home.  After he feeling it come out he fell to the ground no loss of consciousness.  Patient not on blood thinners.  History of same 8 weeks ago.  EMS gave patient 100 mcg of fentanyl he was brought in by [facility].  Patient followed by [facility].  Patient had a joint replacement to the right hip in 2003.  Patient evaluated July 18 for dislocation of the hip.  Which was put back in by Dr. Vanita Panda and then patient was sent home.  Since that time patient's been complaining of pain to the right knee and had an effusion was seen by Dr. Lorin Mercy at [place] and they did a steroid injection in that knee.  Patient still having pain there.  Patient feels as if the knee was over manipulated when they were trying to get the hip back in.  Patient has a history of coronary artery disease.  Patient has a history of diabetes chronic lower back pain chronic leg pain on the right.  Patient is on Plavix not on any other blood thinners.  Patient is on long-acting nitrates.     Past Medical History:  Diagnosis Date  . Allergic rhinitis   . Allergic rhinitis   . Arthritis   . Chronic leg pain    right  . Chronic lower back pain   . Coronary artery disease    a. Stenting to RCA 2004; staged DES to LAD and Cx 2004. DES to mRCA 2012. b. DES to mCx, PTCA to dCx 11/2011. c. Lateral wall MI 2013 s/p PTCA to distal Cx & DES to mid OM2 11/2011. d. Low risk nuc 04/2014, EF wnl.  . Diabetes mellitus    Insulin dependent  . Diabetic neuropathy (HCC)    MILD  . Diverticulosis   .  Dysrhythmia   . Gilbert syndrome   . Gout    right wrist; right foot; right elbow; have had it since 1970's  . H/O hiatal hernia   . Heart murmur   . History of echocardiogram    aortic sclerosis per echo 12/09 EF 65%, otherwise normal  . History of hemorrhoids    BLEEDING  . Hypertension    Diagnosed 1995   . Pancreatic pseudocyst    a. s/p remote drainage 2006.  Marland Kitchen Thrombocytopenia ([place])    Seen on oldest labs in system from 2004  . Vitamin B 12 deficiency    orally replaced    Patient Active Problem List   Diagnosis Date Noted  . Posterior dislocation of right hip ([place]) 04/25/2018  . Burn, foot, second degree, left, initial encounter 06/08/2017  . 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 S/P total knee arthroplasty, left 10/26/2016  . Hyperlipidemia 09/04/2014  . Thrombocytopenia ([place])   . Precordial chest pain 04/05/2014  . Coronary atherosclerosis of native coronary artery 10/01/2013  . Other and unspecified hyperlipidemia 10/01/2013  . Essential hypertension, benign 10/01/2013  . Insulin dependent type 2 diabetes mellitus ([place]) 10/01/2013  . Esophageal reflux 10/01/2013  .  Hypertrophy of prostate without urinary obstruction and other lower urinary tract symptoms (LUTS) 10/01/2013    Past Surgical History:  Procedure Laterality Date  . BACK SURGERY     "total of 3 times" S/P fall   . CARPAL TUNNEL RELEASE Bilateral   . CHOLECYSTECTOMY  1990's  . COLONOSCOPY    . CORONARY ANGIOPLASTY  11/11/11  . CORONARY ANGIOPLASTY WITH STENT PLACEMENT  09/30/2011   "1 then; makes a total of 4"  . CORONARY ANGIOPLASTY WITH STENT PLACEMENT  11/11/11   "1; makes a total of 5"  . INGUINAL HERNIA REPAIR  2003   right  . JOINT REPLACEMENT Right 04/03/2002   hip replacment  . KNEE ARTHROSCOPY  1990's   left  . LEFT HEART CATHETERIZATION WITH CORONARY ANGIOGRAM N/A 09/30/2011   Procedure: LEFT HEART CATHETERIZATION WITH CORONARY ANGIOGRAM;  Surgeon: Jettie Booze, MD;  Location[facility] CATH LAB;  Service: Cardiovascular;  Laterality: N/A;  possible PCI  . LEFT HEART CATHETERIZATION WITH CORONARY ANGIOGRAM N/A 11/15/2011   Procedure: LEFT HEART CATHETERIZATION WITH CORONARY ANGIOGRAM;  Surgeon: Jettie Booze, MD;  Location: [facility] CATH LAB;  Service: Cardiovascular;  Laterality: N/A;  . PERCUTANEOUS CORONARY STENT INTERVENTION (PCI-S)  09/30/2011   Procedure: PERCUTANEOUS CORONARY STENT INTERVENTION (PCI-S);  Surgeon: Jettie Booze, MD;  Location: [facility] CATH LAB;  Service: Cardiovascular;;  . PERCUTANEOUS CORONARY STENT INTERVENTION (PCI-S) N/A 11/11/2011   Procedure: PERCUTANEOUS CORONARY STENT INTERVENTION (PCI-S);  Surgeon: Jettie Booze, MD;  Location: [facility] CATH LAB;  Service: Cardiovascular;  Laterality: N/A;  . SHOULDER SURGERY Right    X 2  . TONSILLECTOMY  ~ 1948  . TOTAL KNEE ARTHROPLASTY Left 07/23/2016   Procedure: LEFT TOTAL KNEE ARTHROPLASTY;  Surgeon: Marybelle Killings, MD;  Location: [place];  Service: Orthopedics;  Laterality: Left;        Home Medications    Prior to Admission medications   Medication Sig Start Date End Date Taking? Authorizing Provider  allopurinol (ZYLOPRIM) 300 MG tablet Take 300 mg by mouth daily.    Yes [provider]  amLODipine (NORVASC) 2.5 MG tablet Take 2.5 mg by mouth daily.   Yes [provider]  aspirin EC 81 MG tablet Take 81 mg by mouth daily.   Yes [provider]  clopidogrel (PLAVIX) 75 MG tablet TAKE ONE TABLET BY MOUTH ONE TIME DAILY Patient taking differently: Take 75 mg by mouth daily.  07/18/13  Yes Jettie Booze, MD  gabapentin (NEURONTIN) 300 MG capsule Take 300 mg by mouth 2 (two) times daily. 12/02/17  Yes [provider]  glimepiride (AMARYL) 4 MG tablet Take 4 mg by mouth 2 (two) times daily.    Yes [provider]  hydrochlorothiazide (MICROZIDE) 12.5 MG capsule Take 12.5 mg by mouth daily.  05/05/18  Yes [provider]    insulin detemir (LEVEMIR) 100 UNIT/ML injection Inject 30-50 Units into the skin daily as needed (blood sugar). CBG 150-200 = 30 units, 200-250 = 45 units, CBG > 250 = 50 units    Yes [provider]  isosorbide mononitrate (IMDUR) 30 MG 24 hr tablet TAKE 1 TABLET (30 MG TOTAL) BY MOUTH DAILY. 01/09/18  Yes Jettie Booze, MD  KLOR-CON M20 20 MEQ tablet Take 20 mEq by mouth daily.  04/27/18  Yes [provider]  losartan (COZAAR) 50 MG tablet Take 50 mg by mouth daily. 05/05/18  Yes [provider]  metFORMIN (GLUCOPHAGE) 1000 MG tablet Take 1  tablet (1,000 mg total) by mouth 2 (two) times daily. 11/13/11  Yes Jettie Booze, MD  nitroGLYCERIN (NITROSTAT) 0.4 MG SL tablet Place 0.4 mg under the tongue every 5 (five) minutes as needed for chest pain.   Yes [provider]  rosuvastatin (CRESTOR) 10 MG tablet Take 1 tablet (10 mg total) by mouth once a week. 01/05/18 06/13/18 Yes Jettie Booze, MD  terazosin (HYTRIN) 5 MG capsule Take 5 mg by mouth at bedtime.    Yes [provider]  cephALEXin (KEFLEX) 500 MG capsule Take 1 capsule (500 mg total) by mouth 3 (three) times daily. Patient not taking: Reported on 05/23/2018 06/08/17   [name], MD  losartan-hydrochlorothiazide [facility]) 50-12.5 MG tablet Take 1 tablet by mouth daily. 10/28/17   [provider]  ONE TOUCH ULTRA TEST test strip CHECK BLOOD SUGAR ONCE DAILY AS DIRECTED 09/23/17   [provider]  telmisartan-hydrochlorothiazide (MICARDIS HCT) 80-12.5 MG per tablet 2 tablets by mouth daily. Patient not taking: Reported on 06/13/2018 11/07/13   Jettie Booze, MD    Family History Family History  Problem Relation Age of Onset  . Diabetes Mother   . Hyperlipidemia Mother   . Hypertension Mother   . Cancer Father   . Hypertension Father   . Diabetes Sister   . Hypertension Sister   . Cancer Brother   . Heart attack Neg Hx     Social History Social  History   Tobacco Use  . Smoking status: Former Research scientist (life sciences)  . Smokeless tobacco: Current User    Types: Chew  Substance Use Topics  . Alcohol use: No  . Drug use: No     Allergies   Simvastatin; Zetia [ezetimibe]; Dilaudid [hydromorphone hcl]; and Fish oil   Review of Systems Review of Systems  Constitutional: Negative for fever.  HENT: Negative for congestion.   Eyes: Negative for redness.  Respiratory: Negative for shortness of breath.   Gastrointestinal: Negative for abdominal pain.  Genitourinary: Negative for dysuria.  Musculoskeletal: Positive for back pain and joint swelling.  Skin: Negative for rash and wound.  Neurological: Negative for headaches.  Psychiatric/Behavioral: Negative for confusion.     Physical Exam Updated Vital Signs BP (!) 153/111   Pulse 72   Temp (!) 97.5 F (36.4 C) (Oral)   Resp 12   Ht 1.778 m (5\' 10" )   Wt 86.2 kg   SpO2 100%   BMI 27.26 kg/m   Physical Exam  Constitutional: He is oriented to person, place, and time. He appears well-developed and well-nourished. He appears distressed.  HENT:  Head: Normocephalic and atraumatic.  Mouth/Throat: Oropharynx is clear and moist.  Eyes: Pupils are equal, round, and reactive to light. Conjunctivae and EOM are normal.  Neck: Neck supple.  Cardiovascular: Normal rate, regular rhythm and normal heart sounds.  Pulmonary/Chest: Effort normal and breath sounds normal.  Abdominal: Soft. Bowel sounds are normal. There is no tenderness.  Musculoskeletal: He exhibits deformity.  Patient with obvious deformity of his right hip.  Distally has cap refill.  Can move the toes.  Right leg is shortened.  Neurological: He is alert and oriented to person, place, and time. No cranial nerve deficit or sensory deficit. He exhibits normal muscle tone. Coordination normal.  Skin: Skin is warm. No rash noted.  Nursing note and vitals reviewed.    ED Treatments / Results  Labs (all labs ordered are listed,  but only abnormal results are displayed) Labs Reviewed  BASIC  METABOLIC PANEL - Abnormal; Notable for the following components:      Result Value   Potassium 3.4 (*)    Glucose, Bld 162 (*)    All other components within normal limits  CBC - Abnormal; Notable for the following components:   RBC 4.14 (*)    HCT 37.8 (*)    Platelets 132 (*)    All other components within normal limits    EKG None  Radiology Dg Hip Port Unilat W Or Wo Pelvis 1 View Right  Result Date: 06/13/2018 CLINICAL DATA:  Status post reduction of right hip dislocation which the patient suffered today while trying to put on shoes. Initial encounter. EXAM: DG HIP (WITH OR WITHOUT PELVIS) 1V PORT RIGHT COMPARISON:  Plain films right hip earlier today. FINDINGS: Dislocation of the patient's right hip prosthesis has been reduced. No fracture or other acute abnormality is identified. IMPRESSION: Successful reduction of dislocation of the patient's right hip prosthesis. No acute abnormality. Electronically Signed   By: Inge Rise M.D.   On: 06/13/2018 12:54   Dg Hip Unilat W Or Wo Pelvis 2-3 Views Right  Result Date: 06/13/2018 CLINICAL DATA:  Right hip pain EXAM: DG HIP (WITH OR WITHOUT PELVIS) 2-3V RIGHT COMPARISON:  None. FINDINGS: Right total hip arthroplasty. No acute fracture. Posterior right hip dislocation. No left hip fracture or dislocation. Generalized osteopenia. IMPRESSION: Right total hip arthroplasty with posterior dislocation. Electronically Signed   By: Kathreen Devoid   On: 06/13/2018 10:14    Procedures .Sedation Date/Time: 06/13/2018 10:00 AM Performed by: Fredia Sorrow, MD Authorized by: Fredia Sorrow, MD   Consent:    Consent obtained:  Written   Consent given by:  Patient   Risks discussed:  Allergic reaction, inadequate sedation, dysrhythmia, prolonged hypoxia resulting in organ damage, prolonged sedation necessitating reversal, respiratory compromise necessitating ventilatory  assistance and intubation, nausea and vomiting   Alternatives discussed:  Analgesia without sedation Universal protocol:    Procedure explained and questions answered to patient or proxy's satisfaction: yes     Relevant documents present and verified: yes     Test results available and properly labeled: yes     Imaging studies available: yes     Required blood products, implants, devices, and special equipment available: yes     Site/side marked: no     Immediately prior to procedure a time out was called: yes   Indications:    Procedure performed:  Dislocation reduction   Procedure necessitating sedation performed by:  Physician performing sedation   Intended level of sedation:  Moderate (conscious sedation) Pre-sedation assessment:    Time since last food or drink:  2 hours ago   NPO status caution: urgency dictates proceeding with non-ideal NPO status     ASA classification: class 3 - patient with severe systemic disease     Neck mobility: normal     Mallampati score:  III - soft palate, base of uvula visible   Pre-sedation assessments completed and reviewed: airway patency, cardiovascular function, hydration status, mental status, nausea/vomiting, pain level and respiratory function   Immediate pre-procedure details:    Reviewed: vital signs     Verified: bag valve mask available, emergency equipment available, intubation equipment available, IV patency confirmed, oxygen available and suction available   Procedure details (see MAR for exact dosages):    Preoxygenation:  Nasal cannula   Sedation:  Propofol   Analgesia:  Fentanyl and hydromorphone   Intra-procedure monitoring:  Blood pressure monitoring, cardiac monitor,  continuous capnometry, continuous pulse oximetry, frequent LOC assessments and frequent vital sign checks   Intra-procedure events: none     Total Provider sedation time (minutes):  20 Post-procedure details:    Attendance: Constant attendance by certified staff  until patient recovered     Recovery: Patient returned to pre-procedure baseline     Post-sedation assessments completed and reviewed: mental status     Patient is stable for discharge or admission: yes     Patient tolerance:  Tolerated well, no immediate complications   (including critical care time)  Medications Ordered in ED Medications  0.9 %  sodium chloride infusion ( Intravenous New Bag/Given 06/13/18 0940)  ondansetron (ZOFRAN) injection 4 mg (4 mg Intravenous Given 06/13/18 0939)  fentaNYL (SUBLIMAZE) injection 100 mcg (100 mcg Intravenous Given 06/13/18 0939)  fentaNYL (SUBLIMAZE) injection 100 mcg (100 mcg Intravenous Given 06/13/18 1107)  propofol (DIPRIVAN) 10 mg/mL bolus/IV push 43.1 mg (40 mg Intravenous Given 06/13/18 1241)  fentaNYL (SUBLIMAZE) injection 100 mcg (100 mcg Intravenous Given 06/13/18 1210)     Initial Impression / Assessment and Plan / ED Course  I have reviewed the triage vital signs and the nursing notes.  Pertinent labs & imaging results that were available during my care of the patient were reviewed by me and considered in my medical decision making (see chart for details).    X-rays confirmed a right posterior hip dislocation.  Status post right hip replacement in the past.  No evidence of any fractures.  Patient's labs without significant abnormalities.  Patient given pain medicine here for control.  Because of the events with the knee problem following the last relocation patient wants it done by orthopedics.  [facility] contacted.  ED physician assistant will relocate the hip and I will do the conscious sedation.  Patient was titrated with propofol which he tolerated well.  He was on cardiac monitoring he was on oxygen.  The orthopedic physician assistant was able to relocate his hip confirmed by x-rays.  Patient was placed in a knee immobilizer.     Final Clinical Impressions(s) / ED Diagnoses   Final diagnoses:  Dislocation of right hip,  initial encounter [facility])    ED Discharge Orders    None       Fredia Sorrow, MD 06/30/18 2324

## 2018-06-13 NOTE — Discharge Instructions (Signed)
Keep knee immobilizer on at all times.  Hip Dislocation Hip dislocation is when the bones in your hip joint move out of place. To treat this, your doctor must move your bones back into place (reduction). This condition is an emergency. If you think you have dislocated your hip, do not move. Get medical help right away. Symptoms of a dislocated hip may include:  Very bad pain in your hip area. Pain may get worse when you move or when you try to use your hip to support (bear) your weight.  Not being able to move the hip.  Having the leg of the dislocated hip looking shorter than the other leg.  Inward turning of the foot on the side of the dislocated hip.  Loss of feeling in your lower leg, foot, or ankle.  Follow these instructions at home: If you have a splint:  Do not put pressure on any part of the splint until it is fully hardened. This may take many hours.  Wear the splint as told by your doctor. Remove it only as told by your doctor.  Loosen the splint if your toes tingle, get numb, or turn cold and blue.  Do not let your splint get wet if it is not waterproof. ? Do not take baths, swim, or use a hot tub until your doctor says it is okay. Ask your doctor if you can take showers. You may only be allowed to take sponge baths for bathing. ? If your splint is not waterproof, cover it with a watertight plastic bag when you take a bath or a shower.  Keep the splint clean. Managing pain, stiffness, and swelling  If directed, put ice on the injured area: ? Put ice in a plastic bag. ? Place a towel between your skin and the bag. ? Leave the ice on for 20 minutes, 2-3 times a day.  Wear compression stockings or wraps as told by your doctor.  Move your toes often to avoid stiffness and to lessen swelling. Driving  Do not drive or use heavy machinery while taking prescription pain medicine, or as told by your doctor.  Ask your doctor when it is safe to drive if you have a splint  on your hip. Activity  Return to your normal activities as told by your doctor. Ask your doctor what activities are safe for you.  If physical therapy was prescribed, do exercises as told by your doctor. Safety  Do not use your hip to support your body weight until your doctor says that you can. Use crutches or a walker as told by your doctor. General instructions  Do not use any tobacco products, such as cigarettes, chewing tobacco, and e-cigarettes. Tobacco can delay bone healing. If you need help quitting, ask your doctor.  Take over-the-counter and prescription medicines only as told by your doctor.  Keep all follow-up visits as told by your doctor. This is important. How is this prevented?  If you have trouble walking or keeping your balance, try using a cane or a walker. If you feel unstable, sit down right away.  Exercise regularly, as told by your doctor.  Warm up and stretch before being active.  Cool down and stretch after being active. Contact a doctor if:  You have pain that gets worse.  You have pain that does not get better with medicine.  You have swelling in your hip area or your leg.  You have red skin on your hip area or your leg.  You cannot move any part of your hip or leg.  You feel tingling in any part of your hip, leg, or foot. Get help right away if:  You feel like your hip has dislocated again.  You have very bad pain in your hip or groin.  You have numbness or weakness in your leg. If you have symptoms of a hip dislocation, do not wait to see if the symptoms will go away. Get medical help right away. Call your local emergency services (911 in the [place] Do not drive yourself to the hospital. This information is not intended to replace advice given to you by your health care provider. Make sure you discuss any questions you have with your health care provider. Document Released: 05/20/2011 Document Revised: 02/26/2016 Document Reviewed:  04/22/2015 Elsevier Interactive Patient Education  [employer].

## 2018-06-13 NOTE — Telephone Encounter (Signed)
error 

## 2018-06-13 NOTE — ED Triage Notes (Signed)
Pt felt right hip dislocate while he was attempting to put on his shoes at home. After feeling it dislocate, he fell to the ground. No LOC. No blood thinners. Hx of same 8 weeks ago. Given 115mcg fentanly at 7616 by [employer]. VSS.

## 2018-06-13 NOTE — ED Notes (Signed)
ED Provider at bedside. 

## 2018-06-13 NOTE — ED Notes (Signed)
Ortho tech bedside 

## 2018-06-13 NOTE — ED Notes (Addendum)
Time out done: 1232 10mg  out of 40mg  total propofol given per Dr. Rogene Houston order: 1233 10mg  out of 40mg  total propofol given per Dr. Rogene Houston order: 1233 10mg  out of 40mg  total propofol given per Dr. Rogene Houston order: 1234 5mg  out of 40mg  total propofol given per Dr. Rogene Houston order: 1236 5mg  out of 40mg  total propofol given per Dr. Rogene Houston order: 1237  Patient alert and oriented 1242.

## 2018-06-13 NOTE — Telephone Encounter (Signed)
Pt is s/p a hip dislocation 8 weeks ago and states that this was reduced in the hospital and has caused him to have problems with his knee. He is s/p another dislocation today and is on his way to the hospital now [facility]) and does not want the ER doctor to touch him due to the previous complication. Asking for a call back to discuss.

## 2018-06-13 NOTE — ED Notes (Signed)
Spoke to ortho tech regarding knee immobilizer. Ortho tech will come down to place immobilizer then this RN will discharge patient.

## 2018-06-13 NOTE — ED Notes (Signed)
After administering fentanly 158mcg, patient's O2 saturation dropped to 80% on RA. Placed on 2L O2 and O2 came up to 90% on 2L. Placed on 3L O2 and oxygen now 97% on 3L. Will continue to monitor O2 sats with narcotic administration.

## 2018-06-14 NOTE — Telephone Encounter (Signed)
Hip is back in . I instructed Orion Crook PA-C who reduced hip. He can followup in a week or 2. He is staying in the knee immobilizer. FYI

## 2018-06-15 NOTE — Telephone Encounter (Signed)
noted 

## 2018-06-20 ENCOUNTER — Encounter (INDEPENDENT_AMBULATORY_CARE_PROVIDER_SITE_OTHER): Payer: Self-pay | Admitting: Orthopaedic Surgery

## 2018-06-20 ENCOUNTER — Ambulatory Visit (INDEPENDENT_AMBULATORY_CARE_PROVIDER_SITE_OTHER): Payer: Medicare Other

## 2018-06-20 ENCOUNTER — Ambulatory Visit (INDEPENDENT_AMBULATORY_CARE_PROVIDER_SITE_OTHER): Payer: Medicare Other | Admitting: Orthopaedic Surgery

## 2018-06-20 VITALS — BP 139/71 | HR 67 | Ht 70.0 in | Wt 190.0 lb

## 2018-06-20 DIAGNOSIS — S73014D Posterior dislocation of right hip, subsequent encounter: Secondary | ICD-10-CM

## 2018-06-20 DIAGNOSIS — M25551 Pain in right hip: Secondary | ICD-10-CM

## 2018-06-20 NOTE — Progress Notes (Signed)
Post-Op Visit Note   Patient: Keith Espinoza           Date of Birth: April 07, 1941           MRN: [id number] Visit Date: 06/20/2018 PCP: [name], MD   Assessment & Plan: Second late dislocation post total hip arthroplasty 16 years ago.  He is popped up twice in the last 3 months.  Plan is knee immobilizer x6 weeks and we went over once again hip precautions in extreme detail.  Chief Complaint:  Chief Complaint  Patient presents with  . Right Hip - Follow-up    S/P Right hip dislocation 06/13/18   Visit Diagnoses:  1. Pain in right hip   2. Posterior dislocation of right hip, subsequent encounter     Plan: He will keep the knee immobilizer 6 weeks we spent a long discussion discussing hip stability and safe positions and altering some of the things he is done at home which puts him at increased risk for distal gating his hip.  He has no evidence of eccentric poly-wear.  We discussed of his hip popped out again we will discuss revision surgery.  Currently he states his knee is not swollen after this reduction procedure.  This was a second dislocation.  Follow-Up Instructions: Return in about 6 weeks (around 08/01/2018).   Orders:  Orders Placed This Encounter  Procedures  . XR HIP UNILAT W OR W/O PELVIS 1V RIGHT   No orders of the defined types were placed in this encounter.   Imaging: Xr Hip Unilat W Or W/o Pelvis 1v Right  Result Date: 06/20/2018 Standing AP right hip obtained.  This shows well-positioned total hip arthroplasty without evidence of eccentric wear.  Comparison to earlier hip x-rays 77 years old obtained.  Previous images which were nonweightbearing were compared. Impression: Stable right total hip arthroplasty x16 years without evidence of eccentric polyethylene wear.   PMFS History: Patient Active Problem List   Diagnosis Date Noted  . Posterior dislocation of right hip ([place]) 04/25/2018  . Burn, foot, second degree, left, initial encounter 06/08/2017    . 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 S/P total knee arthroplasty, left 10/26/2016  . Hyperlipidemia 09/04/2014  . Thrombocytopenia ([place])   . Precordial chest pain 04/05/2014  . Coronary atherosclerosis of native coronary artery 10/01/2013  . Other and unspecified hyperlipidemia 10/01/2013  . Essential hypertension, benign 10/01/2013  . Insulin dependent type 2 diabetes mellitus ([place]) 10/01/2013  . Esophageal reflux 10/01/2013  . Hypertrophy of prostate without urinary obstruction and other lower urinary tract symptoms (LUTS) 10/01/2013   Past Medical History:  Diagnosis Date  . Allergic rhinitis   . Allergic rhinitis   . Arthritis   . Chronic leg pain    right  . Chronic lower back pain   . Coronary artery disease    a. Stenting to RCA 2004; staged DES to LAD and Cx 2004. DES to mRCA 2012. b. DES to mCx, PTCA to dCx 11/2011. c. Lateral wall MI 2013 s/p PTCA to distal Cx & DES to mid OM2 11/2011. d. Low risk nuc 04/2014, EF wnl.  . Diabetes mellitus    Insulin dependent  . Diabetic neuropathy (HCC)    MILD  . Diverticulosis   . Dysrhythmia   . Gilbert syndrome   . Gout    right wrist; right foot; right elbow; have had it since 1970's  . H/O hiatal hernia   . Heart murmur   .  History of echocardiogram    aortic sclerosis per echo 12/09 EF 65%, otherwise normal  . History of hemorrhoids    BLEEDING  . Hypertension    Diagnosed 1995   . Pancreatic pseudocyst    a. s/p remote drainage 2006.  Marland Kitchen Thrombocytopenia ([place])    Seen on oldest labs in system from 2004  . Vitamin B 12 deficiency    orally replaced    Family History  Problem Relation Age of Onset  . Diabetes Mother   . Hyperlipidemia Mother   . Hypertension Mother   . Cancer Father   . Hypertension Father   . Diabetes Sister   . Hypertension Sister   . Cancer Brother   . Heart attack Neg Hx     Past Surgical History:  Procedure Laterality Date  . BACK SURGERY     "total of 3  times" S/P fall   . CARPAL TUNNEL RELEASE Bilateral   . CHOLECYSTECTOMY  1990's  . COLONOSCOPY    . CORONARY ANGIOPLASTY  11/11/11  . CORONARY ANGIOPLASTY WITH STENT PLACEMENT  09/30/2011   "1 then; makes a total of 4"  . CORONARY ANGIOPLASTY WITH STENT PLACEMENT  11/11/11   "1; makes a total of 5"  . INGUINAL HERNIA REPAIR  2003   right  . JOINT REPLACEMENT Right 04/03/2002   hip replacment  . KNEE ARTHROSCOPY  1990's   left  . LEFT HEART CATHETERIZATION WITH CORONARY ANGIOGRAM N/A 09/30/2011   Procedure: LEFT HEART CATHETERIZATION WITH CORONARY ANGIOGRAM;  Surgeon: Jettie Booze, MD;  Location[facility] C[facility] LAB;  Service: Cardiovascular;  Laterality: N/A;  possible PCI  . LEFT HEART CATHETERIZATION WITH CORONARY ANGIOGRAM N/A 11/15/2011   Procedure: LEFT HEART CATHETERIZATION WITH CORONARY ANGIOGRAM;  Surgeon: Jettie Booze, MD;  Location: [facility] CATH LAB;  Service: Cardiovascular;  Laterality: N/A;  . PERCUTANEOUS CORONARY STENT INTERVENTION (PCI-S)  09/30/2011   Procedure: PERCUTANEOUS CORONARY STENT INTERVENTION (PCI-S);  Surgeon: Jettie Booze, MD;  Location: [facility] CATH LAB;  Service: Cardiovascular;;  . PERCUTANEOUS CORONARY STENT INTERVENTION (PCI-S) N/A 11/11/2011   Procedure: PERCUTANEOUS CORONARY STENT INTERVENTION (PCI-S);  Surgeon: Jettie Booze, MD;  Location: [facility] CATH LAB;  Service: Cardiovascular;  Laterality: N/A;  . SHOULDER SURGERY Right    X 2  . TONSILLECTOMY  ~ 1948  . TOTAL KNEE ARTHROPLASTY Left 07/23/2016   Procedure: LEFT TOTAL KNEE ARTHROPLASTY;  Surgeon: Marybelle Killings, MD;  Location: [place];  Service: Orthopedics;  Laterality: Left;   Social History   Occupational History  . Occupation: Retired  Tobacco Use  . Smoking status: Former Research scientist (life sciences)  . Smokeless tobacco: Current User    Types: Chew  Substance and Sexual Activity  . Alcohol use: No  . Drug use: No  . Sexual activity: Not on file

## 2018-06-29 DIAGNOSIS — D81818 Other biotin-dependent carboxylase deficiency: Secondary | ICD-10-CM | POA: Diagnosis not present

## 2018-07-31 DIAGNOSIS — E1140 Type 2 diabetes mellitus with diabetic neuropathy, unspecified: Secondary | ICD-10-CM | POA: Diagnosis not present

## 2018-07-31 DIAGNOSIS — E559 Vitamin D deficiency, unspecified: Secondary | ICD-10-CM | POA: Diagnosis not present

## 2018-08-01 ENCOUNTER — Ambulatory Visit (INDEPENDENT_AMBULATORY_CARE_PROVIDER_SITE_OTHER): Payer: Medicare Other | Admitting: Orthopaedic Surgery

## 2018-08-01 ENCOUNTER — Encounter (INDEPENDENT_AMBULATORY_CARE_PROVIDER_SITE_OTHER): Payer: Self-pay | Admitting: Orthopaedic Surgery

## 2018-08-01 VITALS — BP 139/69 | HR 61

## 2018-08-01 DIAGNOSIS — S73014D Posterior dislocation of right hip, subsequent encounter: Secondary | ICD-10-CM

## 2018-08-01 NOTE — Progress Notes (Signed)
Post-Op Visit Note   Patient: Keith Espinoza           Date of Birth: 1941-04-11           MRN: [id number] Visit Date: 08/01/2018 PCP: [name], MD   Assessment & Plan: Second time hip dislocation, right 16 years post total hip arthroplasty.  Again we reviewed hip precautions.  He can discontinue the knee immobilizer and will follow hip precautions.  If his hip dislocates again I discussed with him he would require revision surgery.  Chief Complaint:  Chief Complaint  Patient presents with  . Right Knee - Pain   Visit Diagnoses:  1. Posterior dislocation of right hip, subsequent encounter     Plan: Discontinue knee immobilizer.  Religiously follow hip precautions which we went over once again.  Follow-up as needed.  Follow-Up Instructions: No follow-ups on file.   Orders:  No orders of the defined types were placed in this encounter.  No orders of the defined types were placed in this encounter.   Imaging: No results found.  PMFS History: Patient Active Problem List   Diagnosis Date Noted  . Posterior dislocation of right hip ([place]) 04/25/2018  . Burn, foot, second degree, left, initial encounter 06/08/2017  . 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 S/P total knee arthroplasty, left 10/26/2016  . Hyperlipidemia 09/04/2014  . Thrombocytopenia ([place])   . Precordial chest pain 04/05/2014  . Coronary atherosclerosis of native coronary artery 10/01/2013  . Other and unspecified hyperlipidemia 10/01/2013  . Essential hypertension, benign 10/01/2013  . Insulin dependent type 2 diabetes mellitus ([place]) 10/01/2013  . Esophageal reflux 10/01/2013  . Hypertrophy of prostate without urinary obstruction and other lower urinary tract symptoms (LUTS) 10/01/2013   Past Medical History:  Diagnosis Date  . Allergic rhinitis   . Allergic rhinitis   . Arthritis   . Chronic leg pain    right  . Chronic lower back pain   . Coronary artery disease    a. Stenting to RCA 2004; staged DES to LAD and Cx 2004. DES to mRCA 2012. b. DES to mCx, PTCA to dCx 11/2011. c. Lateral wall MI 2013 s/p PTCA to distal Cx & DES to mid OM2 11/2011. d. Low risk nuc 04/2014, EF wnl.  . Diabetes mellitus    Insulin dependent  . Diabetic neuropathy (HCC)    MILD  . Diverticulosis   . Dysrhythmia   . Gilbert syndrome   . Gout    right wrist; right foot; right elbow; have had it since 1970's  . H/O hiatal hernia   . Heart murmur   . History of echocardiogram    aortic sclerosis per echo 12/09 EF 65%, otherwise normal  . History of hemorrhoids    BLEEDING  . Hypertension    Diagnosed 1995   . Pancreatic pseudocyst    a. s/p remote drainage 2006.  Marland Kitchen Thrombocytopenia ([place])    Seen on oldest labs in system from 2004  . Vitamin B 12 deficiency    orally replaced    Family History  Problem Relation Age of Onset  . Diabetes Mother   . Hyperlipidemia Mother   . Hypertension Mother   . Cancer Father   . Hypertension Father   . Diabetes Sister   . Hypertension Sister   . Cancer Brother   . Heart attack Neg Hx     Past Surgical History:  Procedure Laterality Date  . BACK SURGERY     "  total of 3 times" S/P fall   . CARPAL TUNNEL RELEASE Bilateral   . CHOLECYSTECTOMY  1990's  . COLONOSCOPY    . CORONARY ANGIOPLASTY  11/11/11  . CORONARY ANGIOPLASTY WITH STENT PLACEMENT  09/30/2011   "1 then; makes a total of 4"  . CORONARY ANGIOPLASTY WITH STENT PLACEMENT  11/11/11   "1; makes a total of 5"  . INGUINAL HERNIA REPAIR  2003   right  . JOINT REPLACEMENT Right 04/03/2002   hip replacment  . KNEE ARTHROSCOPY  1990's   left  . LEFT HEART CATHETERIZATION WITH CORONARY ANGIOGRAM N/A 09/30/2011   Procedure: LEFT HEART CATHETERIZATION WITH CORONARY ANGIOGRAM;  Surgeon: Jettie Booze, MD;  Location: [facility] CATH LAB;  Service: Cardiovascular;  Laterality: N/A;  possible PCI  . LEFT HEART CATHETERIZATION WITH CORONARY ANGIOGRAM N/A 11/15/2011   Procedure: LEFT  HEART CATHETERIZATION WITH CORONARY ANGIOGRAM;  Surgeon: Jettie Booze, MD;  Location: [facility] CATH LAB;  Service: Cardiovascular;  Laterality: N/A;  . PERCUTANEOUS CORONARY STENT INTERVENTION (PCI-S)  09/30/2011   Procedure: PERCUTANEOUS CORONARY STENT INTERVENTION (PCI-S);  Surgeon: Jettie Booze, MD;  Location: [facility] CATH LAB;  Service: Cardiovascular;;  . PERCUTANEOUS CORONARY STENT INTERVENTION (PCI-S) N/A 11/11/2011   Procedure: PERCUTANEOUS CORONARY STENT INTERVENTION (PCI-S);  Surgeon: Jettie Booze, MD;  Location: [facility] CATH LAB;  Service: Cardiovascular;  Laterality: N/A;  . SHOULDER SURGERY Right    X 2  . TONSILLECTOMY  ~ 1948  . TOTAL KNEE ARTHROPLASTY Left 07/23/2016   Procedure: LEFT TOTAL KNEE ARTHROPLASTY;  Surgeon: Marybelle Killings, MD;  Location: [place];  Service: Orthopedics;  Laterality: Left;   Social History   Occupational History  . Occupation: Retired  Tobacco Use  . Smoking status: Former Research scientist (life sciences)  . Smokeless tobacco: Current User    Types: Chew  Substance and Sexual Activity  . Alcohol use: No  . Drug use: No  . Sexual activity: Not on file

## 2018-08-14 DIAGNOSIS — H353132 Nonexudative age-related macular degeneration, bilateral, intermediate dry stage: Secondary | ICD-10-CM | POA: Diagnosis not present

## 2018-08-14 DIAGNOSIS — H35352 Cystoid macular degeneration, left eye: Secondary | ICD-10-CM | POA: Diagnosis not present

## 2018-08-14 DIAGNOSIS — H26492 Other secondary cataract, left eye: Secondary | ICD-10-CM | POA: Diagnosis not present

## 2018-08-14 DIAGNOSIS — E113312 Type 2 diabetes mellitus with moderate nonproliferative diabetic retinopathy with macular edema, left eye: Secondary | ICD-10-CM | POA: Diagnosis not present

## 2018-08-29 ENCOUNTER — Ambulatory Visit (INDEPENDENT_AMBULATORY_CARE_PROVIDER_SITE_OTHER): Payer: Medicare Other

## 2018-08-29 ENCOUNTER — Ambulatory Visit (INDEPENDENT_AMBULATORY_CARE_PROVIDER_SITE_OTHER): Payer: Medicare Other | Admitting: Orthopaedic Surgery

## 2018-08-29 ENCOUNTER — Encounter (INDEPENDENT_AMBULATORY_CARE_PROVIDER_SITE_OTHER): Payer: Self-pay | Admitting: Orthopaedic Surgery

## 2018-08-29 VITALS — BP 136/70 | HR 73 | Ht 70.0 in | Wt 190.0 lb

## 2018-08-29 DIAGNOSIS — I2510 Atherosclerotic heart disease of native coronary artery without angina pectoris: Secondary | ICD-10-CM | POA: Diagnosis not present

## 2018-08-29 DIAGNOSIS — M545 Low back pain, unspecified: Secondary | ICD-10-CM

## 2018-08-29 DIAGNOSIS — G8929 Other chronic pain: Secondary | ICD-10-CM | POA: Diagnosis not present

## 2018-08-29 DIAGNOSIS — M25551 Pain in right hip: Secondary | ICD-10-CM

## 2018-08-29 NOTE — Progress Notes (Signed)
Office Visit Note   Patient: Keith Espinoza           Date of Birth: 1940-11-02           MRN: [id number] Visit Date: 08/29/2018              Requested by: [name], MD [address] [place], [place] [address] PCP: [name], MD   Assessment & Plan: Visit Diagnoses:  1. Chronic right-sided low back pain, unspecified whether sciatica present   2. Pain in right hip     Plan: She has had 2 late hip dislocations one in July 1 in September after total hip done many years ago.  He is being careful with his hip precautions.  He will continue to work on a walking program I will recheck him again in 3 months.  We discussed hip precautions once again.  Follow-Up Instructions: Return in about 3 months (around 11/29/2018).   Orders:  Orders Placed This Encounter  Procedures  . XR Lumbar Spine 2-3 Views  . XR HIP UNILAT W OR W/O PELVIS 2-3 VIEWS RIGHT   No orders of the defined types were placed in this encounter.     Procedures: No procedures performed   Clinical Data: No additional findings.   Subjective: Chief Complaint  Patient presents with  . Right Leg - Pain    HPI 77 year old male returns right total hip arthroplasty 2003.  He had a dislocation on 04/20/2018 and then again on 06/13/2018.  He wear his knee immobilizer for 6 weeks and denies having back pain.  He states he has some discomfort in his buttocks that radiates around right thigh down to the lateral calf and stops at the ankle.  He has not noticed any numbness or tingling no weakness in the leg.  Pain tends to come and go.  He has had previous lumbar surgery x3 with microdiscectomy at L4-5 and L5-S1.  No recent lumbar MRIs in the last few years.  Review of Systems positive for hip dislocation x2 most recent 06/13/2018.   Objective: Vital Signs: BP 136/70   Pulse 73   Ht 5\' 10"  (1.778 m)   Wt 190 lb (86.2 kg)   BMI 27.26 kg/m   Physical Exam  Constitutional: He is oriented to person,  place, and time. He appears well-developed and well-nourished.  HENT:  Head: Normocephalic and atraumatic.  Eyes: Pupils are equal, round, and reactive to light. EOM are normal.  Neck: No tracheal deviation present. No thyromegaly present.  Cardiovascular: Normal rate.  Pulmonary/Chest: Effort normal. He has no wheezes.  Abdominal: Soft. Bowel sounds are normal.  Neurological: He is alert and oriented to person, place, and time.  Skin: Skin is warm and dry. Capillary refill takes less than 2 seconds.  Psychiatric: He has a normal mood and affect. His behavior is normal. Judgment and thought content normal.    Ortho Exam patient has negative straight leg raising.  Good hip range of motion.  Lower extremity reflexes are 2+.  Palpable distal pulse.  Well-healed lumbar incision.  Specialty Comments:  No specialty comments available.  Imaging: Xr Hip Unilat W Or W/o Pelvis 2-3 Views Right  Result Date: 08/29/2018 AP pelvis frog-leg x-ray right hip are obtained and reviewed.  This shows well-positioned total hip arthroplasty right hip.  Slight heterotopic bone is noted unchanged from previous imaging 06/13/2018.  No subsidence or loosening is noted.  No eccentric poly-wear. Impression: Satisfactory right total hip arthroplasty  without subluxation.  Xr Lumbar Spine 2-3 Views  Result Date: 08/29/2018 AP and lateral lumbar spine x-rays are obtained and reviewed.  This shows significant calcification of the abdominal aorta.  Laminotomy noted on AP image L4 and L5.  L4-5 disc space narrowing with spurring 1 mm retrolisthesis. Impression: L4-5 disc space narrowing with spondylosis.  Negative for acute fracture.  Facet arthropathy is noted at L4-5 and L5-S1.    PMFS History: Patient Active Problem List   Diagnosis Date Noted  . Posterior dislocation of right hip ([place]) 04/25/2018  . Burn, foot, second degree, left, initial encounter 06/08/2017  . Sagittal band rupture at metacarpophalangeal  joint 03/16/2017  . S/P total knee arthroplasty, left 10/26/2016  . Hyperlipidemia 09/04/2014  . Thrombocytopenia ([place])   . Precordial chest pain 04/05/2014  . Coronary atherosclerosis of native coronary artery 10/01/2013  . Other and unspecified hyperlipidemia 10/01/2013  . Essential hypertension, benign 10/01/2013  . Insulin dependent type 2 diabetes mellitus ([place]) 10/01/2013  . Esophageal reflux 10/01/2013  . Hypertrophy of prostate without urinary obstruction and other lower urinary tract symptoms (LUTS) 10/01/2013   Past Medical History:  Diagnosis Date  . Allergic rhinitis   . Allergic rhinitis   . Arthritis   . Chronic leg pain    right  . Chronic lower back pain   . Coronary artery disease    a. Stenting to RCA 2004; staged DES to LAD and Cx 2004. DES to mRCA 2012. b. DES to mCx, PTCA to dCx 11/2011. c. Lateral wall MI 2013 s/p PTCA to distal Cx & DES to mid OM2 11/2011. d. Low risk nuc 04/2014, EF wnl.  . Diabetes mellitus    Insulin dependent  . Diabetic neuropathy (HCC)    MILD  . Diverticulosis   . Dysrhythmia   . Gilbert syndrome   . Gout    right wrist; right foot; right elbow; have had it since 1970's  . H/O hiatal hernia   . Heart murmur   . History of echocardiogram    aortic sclerosis per echo 12/09 EF 65%, otherwise normal  . History of hemorrhoids    BLEEDING  . Hypertension    Diagnosed 1995   . Pancreatic pseudocyst    a. s/p remote drainage 2006.  Marland Kitchen Thrombocytopenia ([place])    Seen on oldest labs in system from 2004  . Vitamin B 12 deficiency    orally replaced    Family History  Problem Relation Age of Onset  . Diabetes Mother   . Hyperlipidemia Mother   . Hypertension Mother   . Cancer Father   . Hypertension Father   . Diabetes Sister   . Hypertension Sister   . Cancer Brother   . Heart attack Neg Hx     Past Surgical History:  Procedure Laterality Date  . BACK SURGERY     "total of 3 times" S/P fall   . CARPAL TUNNEL RELEASE  Bilateral   . CHOLECYSTECTOMY  1990's  . COLONOSCOPY    . CORONARY ANGIOPLASTY  11/11/11  . CORONARY ANGIOPLASTY WITH STENT PLACEMENT  09/30/2011   "1 then; makes a total of 4"  . CORONARY ANGIOPLASTY WITH STENT PLACEMENT  11/11/11   "1; makes a total of 5"  . INGUINAL HERNIA REPAIR  2003   right  . JOINT REPLACEMENT Right 04/03/2002   hip replacment  . KNEE ARTHROSCOPY  1990's   left  . LEFT HEART CATHETERIZATION WITH CORONARY ANGIOGRAM N/A 09/30/2011   Procedure: LEFT  HEART CATHETERIZATION WITH CORONARY ANGIOGRAM;  Surgeon: Jettie Booze, MD;  Location: [facility] CATH LAB;  Service: Cardiovascular;  Laterality: N/A;  possible PCI  . LEFT HEART CATHETERIZATION WITH CORONARY ANGIOGRAM N/A 11/15/2011   Procedure: LEFT HEART CATHETERIZATION WITH CORONARY ANGIOGRAM;  Surgeon: Jettie Booze, MD;  Location: [facility] CATH LAB;  Service: Cardiovascular;  Laterality: N/A;  . PERCUTANEOUS CORONARY STENT INTERVENTION (PCI-S)  09/30/2011   Procedure: PERCUTANEOUS CORONARY STENT INTERVENTION (PCI-S);  Surgeon: Jettie Booze, MD;  Location: [facility] CATH LAB;  Service: Cardiovascular;;  . PERCUTANEOUS CORONARY STENT INTERVENTION (PCI-S) N/A 11/11/2011   Procedure: PERCUTANEOUS CORONARY STENT INTERVENTION (PCI-S);  Surgeon: Jettie Booze, MD;  Location: [facility] CATH LAB;  Service: Cardiovascular;  Laterality: N/A;  . SHOULDER SURGERY Right    X 2  . TONSILLECTOMY  ~ 1948  . TOTAL KNEE ARTHROPLASTY Left 07/23/2016   Procedure: LEFT TOTAL KNEE ARTHROPLASTY;  Surgeon: Marybelle Killings, MD;  Location: [place];  Service: Orthopedics;  Laterality: Left;   Social History   Occupational History  . Occupation: Retired  Tobacco Use  . Smoking status: Former Research scientist (life sciences)  . Smokeless tobacco: Current User    Types: Chew  Substance and Sexual Activity  . Alcohol use: No  . Drug use: No  . Sexual activity: Not on file

## 2018-09-14 DIAGNOSIS — E538 Deficiency of other specified B group vitamins: Secondary | ICD-10-CM | POA: Diagnosis not present

## 2018-11-13 DIAGNOSIS — E538 Deficiency of other specified B group vitamins: Secondary | ICD-10-CM | POA: Diagnosis not present

## 2018-11-13 DIAGNOSIS — I25118 Atherosclerotic heart disease of native coronary artery with other forms of angina pectoris: Secondary | ICD-10-CM | POA: Diagnosis not present

## 2018-11-13 DIAGNOSIS — E785 Hyperlipidemia, unspecified: Secondary | ICD-10-CM | POA: Diagnosis not present

## 2018-11-13 DIAGNOSIS — E1342 Other specified diabetes mellitus with diabetic polyneuropathy: Secondary | ICD-10-CM | POA: Diagnosis not present

## 2018-11-13 DIAGNOSIS — M109 Gout, unspecified: Secondary | ICD-10-CM | POA: Diagnosis not present

## 2018-11-13 DIAGNOSIS — Z0000 Encounter for general adult medical examination without abnormal findings: Secondary | ICD-10-CM | POA: Diagnosis not present

## 2018-11-13 DIAGNOSIS — Z23 Encounter for immunization: Secondary | ICD-10-CM | POA: Diagnosis not present

## 2018-11-13 DIAGNOSIS — E559 Vitamin D deficiency, unspecified: Secondary | ICD-10-CM | POA: Diagnosis not present

## 2018-11-13 DIAGNOSIS — Z79899 Other long term (current) drug therapy: Secondary | ICD-10-CM | POA: Diagnosis not present

## 2018-11-13 DIAGNOSIS — Z1389 Encounter for screening for other disorder: Secondary | ICD-10-CM | POA: Diagnosis not present

## 2018-11-13 DIAGNOSIS — E1140 Type 2 diabetes mellitus with diabetic neuropathy, unspecified: Secondary | ICD-10-CM | POA: Diagnosis not present

## 2018-11-29 ENCOUNTER — Encounter (INDEPENDENT_AMBULATORY_CARE_PROVIDER_SITE_OTHER): Payer: Self-pay | Admitting: Orthopaedic Surgery

## 2018-11-29 ENCOUNTER — Ambulatory Visit (INDEPENDENT_AMBULATORY_CARE_PROVIDER_SITE_OTHER): Payer: Medicare Other | Admitting: Orthopaedic Surgery

## 2018-11-29 ENCOUNTER — Ambulatory Visit (INDEPENDENT_AMBULATORY_CARE_PROVIDER_SITE_OTHER): Payer: Medicare Other

## 2018-11-29 VITALS — BP 126/68 | HR 78 | Ht 70.0 in | Wt 190.0 lb

## 2018-11-29 DIAGNOSIS — G8929 Other chronic pain: Secondary | ICD-10-CM | POA: Diagnosis not present

## 2018-11-29 DIAGNOSIS — M25571 Pain in right ankle and joints of right foot: Secondary | ICD-10-CM

## 2018-11-29 DIAGNOSIS — M545 Low back pain, unspecified: Secondary | ICD-10-CM

## 2018-12-01 NOTE — Progress Notes (Signed)
Office Visit Note   Patient: Keith Espinoza           Date of Birth: 1941-02-23           MRN: [id number] Visit Date: 11/29/2018              Requested by: [name], MD [address] [place], [place] [address] PCP: [name], MD   Assessment & Plan: Visit Diagnoses:  1. Pain in right ankle and joints of right foot   2. Chronic right-sided low back pain, unspecified whether sciatica present     Plan: We will set patient up for right L4-5 epidural injection with Dr. Ernestina Patches which previously worked well for his pain.  This not effective we can consider repeating his MRI scan.  He had a little bit of retrolisthesis at L4-5 and has multilevel spondylosis at other levels.  Follow-Up Instructions: Return if symptoms worsen or fail to improve.   Orders:  Orders Placed This Encounter  Procedures  . XR Ankle Complete Right  . Ambulatory referral to Physical Medicine Rehab   No orders of the defined types were placed in this encounter.     Procedures: No procedures performed   Clinical Data: No additional findings.   Subjective: Chief Complaint  Patient presents with  . Right Hip - Pain, Follow-up  . Right Knee - Pain  . Right Ankle - Pain    HPI 78 year old male returns with ongoing problems with low back pain and right-sided pain over the trochanter.  He is concerned that he might had some problems with his hipAnd requested x-rays of his ankle as well as his hip and also lumbar spine.  He states he was thinking he might need MRIs of his hip his knee and his ankle.  Previous total knee arthroplasty on the left 2017.  Previous x-rays November showed well-positioned total hip arthroplasty on the right without evidence of loosening.  Lumbar spine x-ray showed multilevel spondylosis.  Previous right L45 laminectomy.  He was MRI 201 showed some left-sided narrowing at L4-5.  Previous good relief with injection at L4-5 for several months. Review of Systems post  for previous hip dislocation September 2000 192.  Previous lumbar surgery right L4-5.  Positive for diabetes, gout, hiatal hernia heart murmur.  Positive hypertension thrombocytopenia otherwise negative is obtains HPI.   Objective: Vital Signs: BP 126/68   Pulse 78   Ht 5\' 10"  (1.778 m)   Wt 190 lb (86.2 kg)   BMI 27.26 kg/m   Physical Exam Constitutional:      Appearance: He is well-developed.  HENT:     Head: Normocephalic and atraumatic.  Eyes:     Pupils: Pupils are equal, round, and reactive to light.  Neck:     Thyroid: No thyromegaly.     Trachea: No tracheal deviation.  Cardiovascular:     Rate and Rhythm: Normal rate.  Pulmonary:     Effort: Pulmonary effort is normal.     Breath sounds: No wheezing.  Abdominal:     General: Bowel sounds are normal.     Palpations: Abdomen is soft.  Skin:    General: Skin is warm and dry.     Capillary Refill: Capillary refill takes less than 2 seconds.  Neurological:     Mental Status: He is alert and oriented to person, place, and time.  Psychiatric:        Behavior: Behavior normal.  Thought Content: Thought content normal.        Judgment: Judgment normal.     Ortho Exam well-healed lumbar incision and right total of arthroplasty incision posteriorly.  Mild trochanteric bursal tenderness.  Anterior tib gastrocsoleus is intact.  Has some sciatic notch tenderness more on the right than the left.  Amatory with negative Trendelenburg limp distal pulses palpable. Specialty Comments:  No specialty comments available.  Imaging: No results found.   PMFS History: Patient Active Problem List   Diagnosis Date Noted  . Posterior dislocation of right hip ([place]) 04/25/2018  . Burn, foot, second degree, left, initial encounter 06/08/2017  . 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 S/P total knee arthroplasty, left 10/26/2016  . Hyperlipidemia 09/04/2014  . Thrombocytopenia ([place])   . Precordial  chest pain 04/05/2014  . Coronary atherosclerosis of native coronary artery 10/01/2013  . Other and unspecified hyperlipidemia 10/01/2013  . Essential hypertension, benign 10/01/2013  . Insulin dependent type 2 diabetes mellitus ([place]) 10/01/2013  . Esophageal reflux 10/01/2013  . Hypertrophy of prostate without urinary obstruction and other lower urinary tract symptoms (LUTS) 10/01/2013   Past Medical History:  Diagnosis Date  . Allergic rhinitis   . Allergic rhinitis   . Arthritis   . Chronic leg pain    right  . Chronic lower back pain   . Coronary artery disease    a. Stenting to RCA 2004; staged DES to LAD and Cx 2004. DES to mRCA 2012. b. DES to mCx, PTCA to dCx 11/2011. c. Lateral wall MI 2013 s/p PTCA to distal Cx & DES to mid OM2 11/2011. d. Low risk nuc 04/2014, EF wnl.  . Diabetes mellitus    Insulin dependent  . Diabetic neuropathy (HCC)    MILD  . Diverticulosis   . Dysrhythmia   . Gilbert syndrome   . Gout    right wrist; right foot; right elbow; have had it since 1970's  . H/O hiatal hernia   . Heart murmur   . History of echocardiogram    aortic sclerosis per echo 12/09 EF 65%, otherwise normal  . History of hemorrhoids    BLEEDING  . Hypertension    Diagnosed 1995   . Pancreatic pseudocyst    a. s/p remote drainage 2006.  Marland Kitchen Thrombocytopenia ([place])    Seen on oldest labs in system from 2004  . Vitamin B 12 deficiency    orally replaced    Family History  Problem Relation Age of Onset  . Diabetes Mother   . Hyperlipidemia Mother   . Hypertension Mother   . Cancer Father   . Hypertension Father   . Diabetes Sister   . Hypertension Sister   . Cancer Brother   . Heart attack Neg Hx     Past Surgical History:  Procedure Laterality Date  . BACK SURGERY     "total of 3 times" S/P fall   . CARPAL TUNNEL RELEASE Bilateral   . CHOLECYSTECTOMY  1990's  . COLONOSCOPY    . CORONARY ANGIOPLASTY  11/11/11  . CORONARY ANGIOPLASTY WITH STENT PLACEMENT  09/30/2011    "1 then; makes a total of 4"  . CORONARY ANGIOPLASTY WITH STENT PLACEMENT  11/11/11   "1; makes a total of 5"  . INGUINAL HERNIA REPAIR  2003   right  . JOINT REPLACEMENT Right 04/03/2002   hip replacment  . KNEE ARTHROSCOPY  1990's   left  . LEFT HEART CATHETERIZATION WITH CORONARY ANGIOGRAM N/A 09/30/2011  Procedure: LEFT HEART CATHETERIZATION WITH CORONARY ANGIOGRAM;  Surgeon: Jettie Booze, MD;  Location: [facility] CATH LAB;  Service: Cardiovascular;  Laterality: N/A;  possible PCI  . LEFT HEART CATHETERIZATION WITH CORONARY ANGIOGRAM N/A 11/15/2011   Procedure: LEFT HEART CATHETERIZATION WITH CORONARY ANGIOGRAM;  Surgeon: Jettie Booze, MD;  Location[facility] CATH LAB;  Service: Cardiovascular;  Laterality: N/A;  . PERCUTANEOUS CORONARY STENT INTERVENTION (PCI-S)  09/30/2011   Procedure: PERCUTANEOUS CORONARY STENT INTERVENTION (PCI-S);  Surgeon: Jettie Booze, MD;  Location: [facility] CATH LAB;  Service: Cardiovascular;;  . PERCUTANEOUS CORONARY STENT INTERVENTION (PCI-S) N/A 11/11/2011   Procedure: PERCUTANEOUS CORONARY STENT INTERVENTION (PCI-S);  Surgeon: Jettie Booze, MD;  Location: [facility] CATH LAB;  Service: Cardiovascular;  Laterality: N/A;  . SHOULDER SURGERY Right    X 2  . TONSILLECTOMY  ~ 1948  . TOTAL KNEE ARTHROPLASTY Left 07/23/2016   Procedure: LEFT TOTAL KNEE ARTHROPLASTY;  Surgeon: Marybelle Killings, MD;  Location: [facility];  Service: Orthopedics;  Laterality: Left;   Social History   Occupational History  . Occupation: Retired  Tobacco Use  . Smoking status: Former Research scientist (life sciences)  . Smokeless tobacco: Current User    Types: Chew  Substance and Sexual Activity  . Alcohol use: No  . Drug use: No  . Sexual activity: Not on file

## 2018-12-12 ENCOUNTER — Ambulatory Visit (INDEPENDENT_AMBULATORY_CARE_PROVIDER_SITE_OTHER): Payer: Medicare Other | Admitting: Orthopaedic Surgery

## 2018-12-18 ENCOUNTER — Ambulatory Visit (INDEPENDENT_AMBULATORY_CARE_PROVIDER_SITE_OTHER): Payer: Medicare Other | Admitting: Physical Medicine and Rehabilitation

## 2018-12-18 ENCOUNTER — Other Ambulatory Visit: Payer: Self-pay

## 2018-12-18 ENCOUNTER — Ambulatory Visit (INDEPENDENT_AMBULATORY_CARE_PROVIDER_SITE_OTHER): Payer: Self-pay

## 2018-12-18 ENCOUNTER — Encounter (INDEPENDENT_AMBULATORY_CARE_PROVIDER_SITE_OTHER): Payer: Self-pay | Admitting: Physical Medicine and Rehabilitation

## 2018-12-18 VITALS — BP 158/83 | HR 81 | Temp 97.8°F

## 2018-12-18 DIAGNOSIS — M5416 Radiculopathy, lumbar region: Secondary | ICD-10-CM | POA: Diagnosis not present

## 2018-12-18 MED ORDER — BETAMETHASONE SOD PHOS & ACET 6 (3-3) MG/ML IJ SUSP
12.0000 mg | Freq: Once | INTRAMUSCULAR | Status: AC
  Administered 2018-12-18: 12 mg

## 2018-12-18 NOTE — Progress Notes (Signed)
 .  Numeric Pain Rating Scale and Functional Assessment Average Pain 3   In the last MONTH (on 0-10 scale) has pain interfered with the following?  1. General activity like being  able to carry out your everyday physical activities such as walking, climbing stairs, carrying groceries, or moving a chair?  Rating(2)   +Driver, +BT(plavix, ok for inj), -Dye Allergies.

## 2018-12-20 NOTE — Procedures (Signed)
Lumbosacral Transforaminal Epidural Steroid Injection - Sub-Pedicular Approach with Fluoroscopic Guidance  Patient: Keith Espinoza      Date of Birth: 09-13-1941 MRN: [id number] PCP: [name], MD      Visit Date: 12/18/2018   Universal Protocol:    Date/Time: 12/18/2018  Consent Given By: the patient  Position: PRONE  Additional Comments: Vital signs were monitored before and after the procedure. Patient was prepped and draped in the usual sterile fashion. The correct patient, procedure, and site was verified.   Injection Procedure Details:  Procedure Site One Meds Administered:  Meds ordered this encounter  Medications  . betamethasone acetate-betamethasone sodium phosphate (CELESTONE) injection 12 mg    Laterality: Right  Location/Site:  L4-L5 L5-S1  Needle size: 22 G  Needle type: Spinal  Needle Placement: Transforaminal  Findings:    -Comments: Excellent flow of contrast along the nerve and into the epidural space.  Procedure Details: After squaring off the end-plates to get a true AP view, the C-arm was positioned so that an oblique view of the foramen as noted above was visualized. The target area is just inferior to the "nose of the scotty dog" or sub pedicular. The soft tissues overlying this structure were infiltrated with 2-3 ml. of 1% Lidocaine without Epinephrine.  The spinal needle was inserted toward the target using a "trajectory" view along the fluoroscope beam.  Under AP and lateral visualization, the needle was advanced so it did not puncture dura and was located close the 6 O'Clock position of the pedical in AP tracterory. Biplanar projections were used to confirm position. Aspiration was confirmed to be negative for CSF and/or blood. A 1-2 ml. volume of Isovue-250 was injected and flow of contrast was noted at each level. Radiographs were obtained for documentation purposes.   After attaining the desired flow of contrast documented above, a 0.5  to 1.0 ml test dose of 0.25% Marcaine was injected into each respective transforaminal space.  The patient was observed for 90 seconds post injection.  After no sensory deficits were reported, and normal lower extremity motor function was noted,   the above injectate was administered so that equal amounts of the injectate were placed at each foramen (level) into the transforaminal epidural space.   Additional Comments:  The patient tolerated the procedure well No complications occurred Dressing: 2 x 2 sterile gauze and Band-Aid    Post-procedure details: Patient was observed during the procedure. Post-procedure instructions were reviewed.  Patient left the clinic in stable condition.

## 2018-12-20 NOTE — Progress Notes (Signed)
Keith Espinoza - 78 y.o. male MRN [id number]  Date of birth: 1941/07/09  Office Visit Note: Visit Date: 12/18/2018 PCP: [name], MD Referred by: [name], MD  Subjective: Chief Complaint  Patient presents with  . Lower Back - Pain  . Right Hip - Pain  . Right Leg - Pain   HPI: Keith Espinoza is a 78 y.o. male who comes in today At the request of Dr. Rodell Perna for right L4 and L5 transforaminal epidural steroid injection.  Patient had prior injections many years ago which were successful for the most part.  He had 1 injection or so it was not as beneficial but overall did pretty well.  Last time I saw the patient was 2014.  We are going to go ahead and complete a right L4 and L5 transforaminal injection for his right hip and leg pain which clearly seems radicular.  Follow-up with Dr. Rodell Perna.  ROS Otherwise per HPI.  Assessment & Plan: Visit Diagnoses:  1. Lumbar radiculopathy     Plan: No additional findings.   Meds & Orders:  Meds ordered this encounter  Medications  . betamethasone acetate-betamethasone sodium phosphate (CELESTONE) injection 12 mg    Orders Placed This Encounter  Procedures  . XR C-ARM NO REPORT  . Epidural Steroid injection    Follow-up: Return if symptoms worsen or fail to improve.   Procedures: No procedures performed  Lumbosacral Transforaminal Epidural Steroid Injection - Sub-Pedicular Approach with Fluoroscopic Guidance  Patient: Keith Espinoza      Date of Birth: 1941-02-22 MRN: [id number] PCP: [name], MD      Visit Date: 12/18/2018   Universal Protocol:    Date/Time: 12/18/2018  Consent Given By: the patient  Position: PRONE  Additional Comments: Vital signs were monitored before and after the procedure. Patient was prepped and draped in the usual sterile fashion. The correct patient, procedure, and site was verified.   Injection Procedure Details:  Procedure Site One Meds Administered:  Meds ordered this  encounter  Medications  . betamethasone acetate-betamethasone sodium phosphate (CELESTONE) injection 12 mg    Laterality: Right  Location/Site:  L4-L5 L5-S1  Needle size: 22 G  Needle type: Spinal  Needle Placement: Transforaminal  Findings:    -Comments: Excellent flow of contrast along the nerve and into the epidural space.  Procedure Details: After squaring off the end-plates to get a true AP view, the C-arm was positioned so that an oblique view of the foramen as noted above was visualized. The target area is just inferior to the "nose of the scotty dog" or sub pedicular. The soft tissues overlying this structure were infiltrated with 2-3 ml. of 1% Lidocaine without Epinephrine.  The spinal needle was inserted toward the target using a "trajectory" view along the fluoroscope beam.  Under AP and lateral visualization, the needle was advanced so it did not puncture dura and was located close the 6 O'Clock position of the pedical in AP tracterory. Biplanar projections were used to confirm position. Aspiration was confirmed to be negative for CSF and/or blood. A 1-2 ml. volume of Isovue-250 was injected and flow of contrast was noted at each level. Radiographs were obtained for documentation purposes.   After attaining the desired flow of contrast documented above, a 0.5 to 1.0 ml test dose of 0.25% Marcaine was injected into each respective transforaminal space.  The patient was observed for 90 seconds post injection.  After no sensory deficits were reported, and normal  lower extremity motor function was noted,   the above injectate was administered so that equal amounts of the injectate were placed at each foramen (level) into the transforaminal epidural space.   Additional Comments:  The patient tolerated the procedure well No complications occurred Dressing: 2 x 2 sterile gauze and Band-Aid    Post-procedure details: Patient was observed during the procedure. Post-procedure  instructions were reviewed.  Patient left the clinic in stable condition.     Clinical History: No specialty comments available.   He reports that he has quit smoking. His smokeless tobacco use includes chew. No results for input(s): HGBA1C, LABURIC in the last 8760 hours.  Objective:  VS:  HT:    WT:   BMI:     BP:(!) 158/83  HR:81bpm  TEMP:97.8 F (36.6 C)(Oral)  RESP:  Physical Exam  Ortho Exam Imaging: No results found.  Past Medical/Family/Surgical/Social History: Medications & Allergies reviewed per EMR, new medications updated. Patient Active Problem List   Diagnosis Date Noted  . Posterior dislocation of right hip ([place]) 04/25/2018  . Burn, foot, second degree, left, initial encounter 06/08/2017  . 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 S/P total knee arthroplasty, left 10/26/2016  . Hyperlipidemia 09/04/2014  . Thrombocytopenia ([place])   . Precordial chest pain 04/05/2014  . Coronary atherosclerosis of native coronary artery 10/01/2013  . Other and unspecified hyperlipidemia 10/01/2013  . Essential hypertension, benign 10/01/2013  . Insulin dependent type 2 diabetes mellitus ([place]) 10/01/2013  . Esophageal reflux 10/01/2013  . Hypertrophy of prostate without urinary obstruction and other lower urinary tract symptoms (LUTS) 10/01/2013   Past Medical History:  Diagnosis Date  . Allergic rhinitis   . Allergic rhinitis   . Arthritis   . Chronic leg pain    right  . Chronic lower back pain   . Coronary artery disease    a. Stenting to RCA 2004; staged DES to LAD and Cx 2004. DES to mRCA 2012. b. DES to mCx, PTCA to dCx 11/2011. c. Lateral wall MI 2013 s/p PTCA to distal Cx & DES to mid OM2 11/2011. d. Low risk nuc 04/2014, EF wnl.  . Diabetes mellitus    Insulin dependent  . Diabetic neuropathy (HCC)    MILD  . Diverticulosis   . Dysrhythmia   . Gilbert syndrome   . Gout    right wrist; right foot; right elbow; have had it since  1970's  . H/O hiatal hernia   . Heart murmur   . History of echocardiogram    aortic sclerosis per echo 12/09 EF 65%, otherwise normal  . History of hemorrhoids    BLEEDING  . Hypertension    Diagnosed 1995   . Pancreatic pseudocyst    a. s/p remote drainage 2006.  Marland Kitchen Thrombocytopenia ([place])    Seen on oldest labs in system from 2004  . Vitamin B 12 deficiency    orally replaced   Family History  Problem Relation Age of Onset  . Diabetes Mother   . Hyperlipidemia Mother   . Hypertension Mother   . Cancer Father   . Hypertension Father   . Diabetes Sister   . Hypertension Sister   . Cancer Brother   . Heart attack Neg Hx    Past Surgical History:  Procedure Laterality Date  . BACK SURGERY     "total of 3 times" S/P fall   . CARPAL TUNNEL RELEASE Bilateral   . CHOLECYSTECTOMY  1990's  . COLONOSCOPY    .  CORONARY ANGIOPLASTY  11/11/11  . CORONARY ANGIOPLASTY WITH STENT PLACEMENT  09/30/2011   "1 then; makes a total of 4"  . CORONARY ANGIOPLASTY WITH STENT PLACEMENT  11/11/11   "1; makes a total of 5"  . INGUINAL HERNIA REPAIR  2003   right  . JOINT REPLACEMENT Right 04/03/2002   hip replacment  . KNEE ARTHROSCOPY  1990's   left  . LEFT HEART CATHETERIZATION WITH CORONARY ANGIOGRAM N/A 09/30/2011   Procedure: LEFT HEART CATHETERIZATION WITH CORONARY ANGIOGRAM;  Surgeon: Jettie Booze, MD;  Location: [facility] CATH LAB;  Service: Cardiovascular;  Laterality: N/A;  possible PCI  . LEFT HEART CATHETERIZATION WITH CORONARY ANGIOGRAM N/A 11/15/2011   Procedure: LEFT HEART CATHETERIZATION WITH CORONARY ANGIOGRAM;  Surgeon: Jettie Booze, MD;  Location: [facility] CATH LAB;  Service: Cardiovascular;  Laterality: N/A;  . PERCUTANEOUS CORONARY STENT INTERVENTION (PCI-S)  09/30/2011   Procedure: PERCUTANEOUS CORONARY STENT INTERVENTION (PCI-S);  Surgeon: Jettie Booze, MD;  Location: [facility] CATH LAB;  Service: Cardiovascular;;  . PERCUTANEOUS CORONARY STENT INTERVENTION (PCI-S) N/A  11/11/2011   Procedure: PERCUTANEOUS CORONARY STENT INTERVENTION (PCI-S);  Surgeon: Jettie Booze, MD;  Location: [facility] CATH LAB;  Service: Cardiovascular;  Laterality: N/A;  . SHOULDER SURGERY Right    X 2  . TONSILLECTOMY  ~ 1948  . TOTAL KNEE ARTHROPLASTY Left 07/23/2016   Procedure: LEFT TOTAL KNEE ARTHROPLASTY;  Surgeon: Marybelle Killings, MD;  Location: [facility];  Service: Orthopedics;  Laterality: Left;   Social History   Occupational History  . Occupation: Retired  Tobacco Use  . Smoking status: Former Research scientist (life sciences)  . Smokeless tobacco: Current User    Types: Chew  Substance and Sexual Activity  . Alcohol use: No  . Drug use: No  . Sexual activity: Not on file

## 2018-12-21 ENCOUNTER — Telehealth: Payer: Self-pay

## 2018-12-21 NOTE — Telephone Encounter (Signed)
Left message for patient to call back regarding appointment with Dr. Irish Lack on 3/26. Call placed due to restrictions enacted for Covid 19.

## 2018-12-27 NOTE — Telephone Encounter (Signed)
Patient rescheduled for 4/30

## 2018-12-28 ENCOUNTER — Ambulatory Visit: Payer: Medicare Other | Admitting: Interventional Cardiology

## 2019-01-20 ENCOUNTER — Other Ambulatory Visit: Payer: Self-pay | Admitting: Interventional Cardiology

## 2019-01-22 ENCOUNTER — Other Ambulatory Visit: Payer: Self-pay | Admitting: Interventional Cardiology

## 2019-01-22 MED ORDER — ISOSORBIDE MONONITRATE ER 30 MG PO TB24: 30.0000 mg | ORAL_TABLET | Freq: Every day | ORAL | 0 refills | Status: DC

## 2019-01-29 ENCOUNTER — Telehealth: Payer: Self-pay

## 2019-01-29 NOTE — Telephone Encounter (Signed)
Virtual Visit Pre-Appointment Phone Call TELEPHONE CALL NOTE  Keith Espinoza has been deemed a candidate for a follow-up tele-health visit to limit community exposure during the Covid-19 pandemic. I spoke with the patient via phone to ensure availability of phone/video source, confirm preferred email & phone number, and discuss instructions and expectations.  I reminded Keith Espinoza to be prepared with any vital sign and/or heart rhythm information that could potentially be obtained via home monitoring, at the time of his visit. I reminded Keith Espinoza to expect a phone call prior to his visit.  Patient agrees to consent below.  Cleon Gustin, RN 01/29/2019 8:53 AM   FULL LENGTH CONSENT FOR TELE-HEALTH VISIT   I hereby voluntarily request, consent and authorize [facility] and its employed or contracted physicians, physician assistants, nurse practitioners or other licensed health care professionals (the Practitioner), to provide me with telemedicine health care services (the "Services") as deemed necessary by the treating Practitioner. I acknowledge and consent to receive the Services by the Practitioner via telemedicine. I understand that the telemedicine visit will involve communicating with the Practitioner through live audiovisual communication technology and the disclosure of certain medical information by electronic transmission. I acknowledge that I have been given the opportunity to request an in-person assessment or other available alternative prior to the telemedicine visit and am voluntarily participating in the telemedicine visit.  I understand that I have the right to withhold or withdraw my consent to the use of telemedicine in the course of my care at any time, without affecting my right to future care or treatment, and that the Practitioner or I may terminate the telemedicine visit at any time. I understand that I have the right to inspect all information obtained  and/or recorded in the course of the telemedicine visit and may receive copies of available information for a reasonable fee.  I understand that some of the potential risks of receiving the Services via telemedicine include:  Marland Kitchen Delay or interruption in medical evaluation due to technological equipment failure or disruption; . Information transmitted may not be sufficient (e.g. poor resolution of images) to allow for appropriate medical decision making by the Practitioner; and/or  . In rare instances, security protocols could fail, causing a breach of personal health information.  Furthermore, I acknowledge that it is my responsibility to provide information about my medical history, conditions and care that is complete and accurate to the best of my ability. I acknowledge that Practitioner's advice, recommendations, and/or decision may be based on factors not within their control, such as incomplete or inaccurate data provided by me or distortions of diagnostic images or specimens that may result from electronic transmissions. I understand that the practice of medicine is not an exact science and that Practitioner makes no warranties or guarantees regarding treatment outcomes. I acknowledge that I will receive a copy of this consent concurrently upon execution via email to the email address I last provided but may also request a printed copy by calling the office of [facility].    I understand that my insurance will be billed for this visit.   I have read or had this consent read to me. . I understand the contents of this consent, which adequately explains the benefits and risks of the Services being provided via telemedicine.  . I have been provided ample opportunity to ask questions regarding this consent and the Services and have had my questions answered to my satisfaction. . I give  my informed consent for the services to be provided through the use of telemedicine in my medical care  By  participating in this telemedicine visit I agree to the above.

## 2019-01-30 DIAGNOSIS — Z720 Tobacco use: Secondary | ICD-10-CM | POA: Insufficient documentation

## 2019-01-30 DIAGNOSIS — I2510 Atherosclerotic heart disease of native coronary artery without angina pectoris: Secondary | ICD-10-CM | POA: Insufficient documentation

## 2019-01-30 NOTE — Progress Notes (Signed)
Virtual Visit via Telephone Note   This visit type was conducted due to national recommendations for restrictions regarding the COVID-19 Pandemic (e.g. social distancing) in an effort to limit this patient's exposure and mitigate transmission in our community.  Due to his co-morbid illnesses, this patient is at least at moderate risk for complications without adequate follow up.  This format is felt to be most appropriate for this patient at this time.  The patient did not have access to video technology/had technical difficulties with video requiring transitioning to audio format only (telephone).  All issues noted in this document were discussed and addressed.  No physical exam could be performed with this format.  Please refer to the patient's chart for his  consent to telehealth for [facility].   Evaluation Performed:  Follow-up visit  Date:  01/31/2019   ID:  Keith Espinoza, Keith Espinoza 1941/05/20, MRN [id number]  Patient Location: Home Provider Location: Home  PCP:  [name], MD  Cardiologist:  Larae Grooms, MD  Electrophysiologist:  None   Chief Complaint:  f/u  History of Present Illness:    Keith Espinoza is a 78 y.o. male with history of CAD S/P stenting RCA 2004 with staged DES-LAD and Cfx 2004, DES RCA 2012, DES mCfx, PTCA dCfx 2013, low risk NST 04/2014.  Last saw Dr. Irish Lack 12/2017 and doing well without angina, advised to quit chewing tobacco. Also has HTN and DM.  Has occasional sharp shooting pain while watching tv that lasts only a second. No tightness. No regular exercise because wife is now on dialysis. Planted a garden yesterday.  The patient does not have symptoms concerning for COVID-19 infection (fever, chills, cough, or new shortness of breath).    Past Medical History:  Diagnosis Date  . Allergic rhinitis   . Allergic rhinitis   . Arthritis   . Chronic leg pain    right  . Chronic lower back pain   . Coronary artery disease    a. Stenting to  RCA 2004; staged DES to LAD and Cx 2004. DES to mRCA 2012. b. DES to mCx, PTCA to dCx 11/2011. c. Lateral wall MI 2013 s/p PTCA to distal Cx & DES to mid OM2 11/2011. d. Low risk nuc 04/2014, EF wnl.  . Diabetes mellitus    Insulin dependent  . Diabetic neuropathy (HCC)    MILD  . Diverticulosis   . Dysrhythmia   . Gilbert syndrome   . Gout    right wrist; right foot; right elbow; have had it since 1970's  . H/O hiatal hernia   . Heart murmur   . History of echocardiogram    aortic sclerosis per echo 12/09 EF 65%, otherwise normal  . History of hemorrhoids    BLEEDING  . Hypertension    Diagnosed 1995   . Pancreatic pseudocyst    a. s/p remote drainage 2006.  Marland Kitchen Thrombocytopenia ([place])    Seen on oldest labs in system from 2004  . Vitamin B 12 deficiency    orally replaced   Past Surgical History:  Procedure Laterality Date  . BACK SURGERY     "total of 3 times" S/P fall   . CARPAL TUNNEL RELEASE Bilateral   . CHOLECYSTECTOMY  1990's  . COLONOSCOPY    . CORONARY ANGIOPLASTY  11/11/11  . CORONARY ANGIOPLASTY WITH STENT PLACEMENT  09/30/2011   "1 then; makes a total of 4"  . CORONARY ANGIOPLASTY WITH STENT PLACEMENT  11/11/11   "1; makes  a total of 5"  . INGUINAL HERNIA REPAIR  2003   right  . JOINT REPLACEMENT Right 04/03/2002   hip replacment  . KNEE ARTHROSCOPY  1990's   left  . LEFT HEART CATHETERIZATION WITH CORONARY ANGIOGRAM N/A 09/30/2011   Procedure: LEFT HEART CATHETERIZATION WITH CORONARY ANGIOGRAM;  Surgeon: Jettie Booze, MD;  Location: [facility] CATH LAB;  Service: Cardiovascular;  Laterality: N/A;  possible PCI  . LEFT HEART CATHETERIZATION WITH CORONARY ANGIOGRAM N/A 11/15/2011   Procedure: LEFT HEART CATHETERIZATION WITH CORONARY ANGIOGRAM;  Surgeon: Jettie Booze, MD;  Location: [facility] CATH LAB;  Service: Cardiovascular;  Laterality: N/A;  . PERCUTANEOUS CORONARY STENT INTERVENTION (PCI-S)  09/30/2011   Procedure: PERCUTANEOUS CORONARY STENT INTERVENTION (PCI-S);   Surgeon: Jettie Booze, MD;  Location: [facility] C[facility] LAB;  Service: Cardiovascular;;  . PERCUTANEOUS CORONARY STENT INTERVENTION (PCI-S) N/A 11/11/2011   Procedure: PERCUTANEOUS CORONARY STENT INTERVENTION (PCI-S);  Surgeon: Jettie Booze, MD;  Location[facility] CATH LAB;  Service: Cardiovascular;  Laterality: N/A;  . SHOULDER SURGERY Right    X 2  . TONSILLECTOMY  ~ 1948  . TOTAL KNEE ARTHROPLASTY Left 07/23/2016   Procedure: LEFT TOTAL KNEE ARTHROPLASTY;  Surgeon: Marybelle Killings, MD;  Location: [place];  Service: Orthopedics;  Laterality: Left;     Current Meds  Medication Sig  . allopurinol (ZYLOPRIM) 300 MG tablet Take 300 mg by mouth daily.   Marland Kitchen amLODipine (NORVASC) 2.5 MG tablet Take 2.5 mg by mouth daily.  Marland Kitchen aspirin EC 81 MG tablet Take 81 mg by mouth daily.  . Cholecalciferol (VITAMIN D3) 50 MCG (2000 UT) TABS Take 1 tablet by mouth daily.  . clopidogrel (PLAVIX) 75 MG tablet TAKE ONE TABLET BY MOUTH ONE TIME DAILY  . gabapentin (NEURONTIN) 300 MG capsule Take 600 mg by mouth 2 (two) times daily.   Marland Kitchen glimepiride (AMARYL) 4 MG tablet Take 4 mg by mouth 2 (two) times daily.   . hydrochlorothiazide (MICROZIDE) 12.5 MG capsule Take 12.5 mg by mouth daily.   . insulin detemir (LEVEMIR) 100 UNIT/ML injection Inject 30-50 Units into the skin daily as needed (blood sugar). CBG 150-200 = 30 units, 200-250 = 45 units, CBG > 250 = 50 units   . isosorbide mononitrate (IMDUR) 30 MG 24 hr tablet Take 30 mg by mouth daily.  Marland Kitchen KLOR-CON M20 20 MEQ tablet Take 20 mEq by mouth daily.   Marland Kitchen losartan (COZAAR) 50 MG tablet Take 50 mg by mouth daily.  . metFORMIN (GLUCOPHAGE) 1000 MG tablet Take 1 tablet (1,000 mg total) by mouth 2 (two) times daily.  . nitroGLYCERIN (NITROSTAT) 0.4 MG SL tablet Place 0.4 mg under the tongue every 5 (five) minutes as needed for chest pain.  . ONE TOUCH ULTRA TEST test strip CHECK BLOOD SUGAR ONCE DAILY AS DIRECTED  . rosuvastatin (CRESTOR) 10 MG tablet Take 1 tablet (10 mg  total) by mouth once a week.  . terazosin (HYTRIN) 5 MG capsule Take 5 mg by mouth at bedtime.      Allergies:   Simvastatin; Zetia [ezetimibe]; Dilaudid [hydromorphone hcl]; and Fish oil   Social History   Tobacco Use  . Smoking status: Former Research scientist (life sciences)  . Smokeless tobacco: Current User    Types: Chew  Substance Use Topics  . Alcohol use: No  . Drug use: No     Family Hx: The patient's family history includes Cancer in his brother and father; Diabetes in his mother and sister; Hyperlipidemia in his mother; Hypertension  in his father, mother, and sister. There is no history of Heart attack.  ROS:   Please see the history of present illness.    Review of Systems  Constitution: Negative.  HENT: Negative.   Cardiovascular: Negative.   Respiratory: Negative.   Endocrine: Negative.   Hematologic/Lymphatic: Negative.   Musculoskeletal: Negative.   Gastrointestinal: Negative.   Genitourinary: Negative.   Neurological: Negative.     All other systems reviewed and are negative.   Prior CV studies:   The following studies were reviewed today: NST 01-04-2014 IMPRESSION: Inferolateral scar.   No findings for myocardial ischemia.   Ejection fraction calculated at 57%     Electronically Signed   By: Kalman Jewels M.D.   On: 04/06/2014 16:50     Labs/Other Tests and Data Reviewed:    EKG:  An ECG dated 04/20/18 was personally reviewed today and demonstrated:  NSR with LVH  Recent Labs: 06/13/2018: BUN 14; Creatinine, Ser 1.05; Hemoglobin 13.1; Platelets 132; Potassium 3.4; Sodium 142   Recent Lipid Panel Lab Results  Component Value Date/Time   CHOL 117 04/06/2014 02:30 AM   TRIG 123 04/06/2014 02:30 AM   HDL 34 (L) 04/06/2014 02:30 AM   CHOLHDL 3.4 04/06/2014 02:30 AM   LDLCALC 58 04/06/2014 02:30 AM    Wt Readings from Last 3 Encounters:  01/31/19 190 lb (86.2 kg)  11/29/18 190 lb (86.2 kg)  08/29/18 190 lb (86.2 kg)     Objective:    Vital Signs:  BP 116/69    Pulse 82   Ht 5\' 10"  (1.778 m)   Wt 190 lb (86.2 kg)   BMI 27.26 kg/m    VITAL SIGNS:  reviewed  ASSESSMENT & PLAN:    CAD S/P stenting RCA Jan 05, 2003 with staged DES-LAD and Cfx 01/05/2003, DES RCA 01-05-2011, DES mCfx, PTCA dCfx 01/05/12, low risk NST 04/2014. No angina, no bleeding problems on plavix and asa.   Essential HTN well controlled  HLD-LDL 77 trig 116 11/2018  DM Hbg A1c 6.8 11/2018  Tobacco abuse- advised to quite chewing tobacco.      COVID-19 Education: The signs and symptoms of COVID-19 were discussed with the patient and how to seek care for testing (follow up with PCP or arrange E-visit).   The importance of social distancing was discussed today.  Time:   Today, I have spent 19:22 minutes with the patient with telehealth technology discussing the above problems.     Medication Adjustments/Labs and Tests Ordered: Current medicines are reviewed at length with the patient today.  Concerns regarding medicines are outlined above.   Tests Ordered: No orders of the defined types were placed in this encounter.   Medication Changes: No orders of the defined types were placed in this encounter.   Disposition:  Follow up in 1 year(s) Dr. Irish Lack  Signed, Ermalinda Barrios, PA-C  01/31/2019 1:17 PM    [place]

## 2019-01-31 ENCOUNTER — Encounter: Payer: Self-pay | Admitting: Physician Assistant

## 2019-01-31 ENCOUNTER — Telehealth (INDEPENDENT_AMBULATORY_CARE_PROVIDER_SITE_OTHER): Payer: Medicare Other | Admitting: Physician Assistant

## 2019-01-31 ENCOUNTER — Other Ambulatory Visit: Payer: Self-pay

## 2019-01-31 VITALS — BP 116/69 | HR 82 | Ht 70.0 in | Wt 190.0 lb

## 2019-01-31 DIAGNOSIS — Z794 Long term (current) use of insulin: Secondary | ICD-10-CM

## 2019-01-31 DIAGNOSIS — E782 Mixed hyperlipidemia: Secondary | ICD-10-CM

## 2019-01-31 DIAGNOSIS — E119 Type 2 diabetes mellitus without complications: Secondary | ICD-10-CM

## 2019-01-31 DIAGNOSIS — I2510 Atherosclerotic heart disease of native coronary artery without angina pectoris: Secondary | ICD-10-CM

## 2019-01-31 DIAGNOSIS — I10 Essential (primary) hypertension: Secondary | ICD-10-CM | POA: Diagnosis not present

## 2019-01-31 DIAGNOSIS — Z720 Tobacco use: Secondary | ICD-10-CM

## 2019-01-31 NOTE — Patient Instructions (Signed)
Medication Instructions:  Your physician recommends that you continue on your current medications as directed. Please refer to the Current Medication list given to you today.  If you need a refill on your cardiac medications before your next appointment, please call your pharmacy.   Lab work: None Ordered  If you have labs (blood work) drawn today and your tests are completely normal, you will receive your results only by: Marland Kitchen MyChart Message (if you have MyChart) OR . A paper copy in the mail If you have any lab test that is abnormal or we need to change your treatment, we will call you to review the results.  Testing/Procedures: None ordered  Follow-Up: At [facility], you and your health needs are our priority.  As part of our continuing mission to provide you with exceptional heart care, we have created designated Provider Care Teams.  These Care Teams include your primary Cardiologist (physician) and Advanced Practice Providers (APPs -  Physician Assistants and Nurse Practitioners) who all work together to provide you with the care you need, when you need it. . You will need a follow up appointment in 1 year.  Please call our office 2 months in advance to schedule this appointment.  You may see Casandra Doffing, MD or one of the following Advanced Practice Providers on your designated Care Team:   . Lyda Jester, PA-C . Dayna Dunn, PA-C . Ermalinda Barrios, PA-C  Any Other Special Instructions Will Be Listed Below (If Applicable). Two Gram Sodium Diet 2000 mg  What is Sodium? Sodium is a mineral found naturally in many foods. The most significant source of sodium in the diet is table salt, which is about 40% sodium.  Processed, convenience, and preserved foods also contain a large amount of sodium.  The body needs only 500 mg of sodium daily to function,  A normal diet provides more than enough sodium even if you do not use salt.  Why Limit Sodium? A build up of sodium in the body can  cause thirst, increased blood pressure, shortness of breath, and water retention.  Decreasing sodium in the diet can reduce edema and risk of heart attack or stroke associated with high blood pressure.  Keep in mind that there are many other factors involved in these health problems.  Heredity, obesity, lack of exercise, cigarette smoking, stress and what you eat all play a role.  General Guidelines:  Do not add salt at the table or in cooking.  One teaspoon of salt contains over 2 grams of sodium.  Read food labels  Avoid processed and convenience foods  Ask your dietitian before eating any foods not dicussed in the menu planning guidelines  Consult your physician if you wish to use a salt substitute or a sodium containing medication such as antacids.  Limit milk and milk products to 16 oz (2 cups) per day.  Shopping Hints:  READ LABELS!! "Dietetic" does not necessarily mean low sodium.  Salt and other sodium ingredients are often added to foods during processing.   Menu Planning Guidelines Food Group Choose More Often Avoid  Beverages (see also the milk group All fruit juices, low-sodium, salt-free vegetables juices, low-sodium carbonated beverages Regular vegetable or tomato juices, commercially softened water used for drinking or cooking  Breads and Cereals Enriched white, wheat, rye and pumpernickel bread, hard rolls and dinner rolls; muffins, cornbread and waffles; most dry cereals, cooked cereal without added salt; unsalted crackers and breadsticks; low sodium or homemade bread crumbs Bread, rolls  and crackers with salted tops; quick breads; instant hot cereals; pancakes; commercial bread stuffing; self-rising flower and biscuit mixes; regular bread crumbs or cracker crumbs  Desserts and Sweets Desserts and sweets mad with mild should be within allowance Instant pudding mixes and cake mixes  Fats Butter or margarine; vegetable oils; unsalted salad dressings, regular salad dressings  limited to 1 Tbs; light, sour and heavy cream Regular salad dressings containing bacon fat, bacon bits, and salt pork; snack dips made with instant soup mixes or processed cheese; salted nuts  Fruits Most fresh, frozen and canned fruits Fruits processed with salt or sodium-containing ingredient (some dried fruits are processed with sodium sulfites        Vegetables Fresh, frozen vegetables and low- sodium canned vegetables Regular canned vegetables, sauerkraut, pickled vegetables, and others prepared in brine; frozen vegetables in sauces; vegetables seasoned with ham, bacon or salt pork  Condiments, Sauces, Miscellaneous  Salt substitute with physician's approval; pepper, herbs, spices; vinegar, lemon or lime juice; hot pepper sauce; garlic powder, onion powder, low sodium soy sauce (1 Tbs.); low sodium condiments (ketchup, chili sauce, mustard) in limited amounts (1 tsp.) fresh ground horseradish; unsalted tortilla chips, pretzels, potato chips, popcorn, salsa (1/4 cup) Any seasoning made with salt including garlic salt, celery salt, onion salt, and seasoned salt; sea salt, rock salt, kosher salt; meat tenderizers; monosodium glutamate; mustard, regular soy sauce, barbecue, sauce, chili sauce, teriyaki sauce, steak sauce, Worcestershire sauce, and most flavored vinegars; canned gravy and mixes; regular condiments; salted snack foods, olives, picles, relish, horseradish sauce, catsup   Food preparation: Try these seasonings Meats:    Pork Sage, onion Serve with applesauce  Chicken Poultry seasoning, thyme, parsley Serve with cranberry sauce  Lamb Curry powder, rosemary, garlic, thyme Serve with mint sauce or jelly  Veal Marjoram, basil Serve with current jelly, cranberry sauce  Beef Pepper, bay leaf Serve with dry mustard, unsalted chive butter  Fish [place] leaf, dill Serve with unsalted lemon butter, unsalted parsley butter  Vegetables:    Asparagus Lemon juice   Broccoli Lemon juice   Carrots  Mustard dressing parsley, mint, nutmeg, glazed with unsalted butter and sugar   Green beans Marjoram, lemon juice, nutmeg,dill seed   Tomatoes Basil, marjoram, onion   Spice /blend for [employer]" 4 tsp ground thyme 1 tsp ground sage 3 tsp ground rosemary 4 tsp ground marjoram   Test your knowledge 1. A product that says "Salt Free" may still contain sodium. True or False 2. Garlic Powder and Hot Pepper Sauce an be used as alternative seasonings.True or False 3. Processed foods have more sodium than fresh foods.  True or False 4. Canned Vegetables have less sodium than froze True or False  WAYS TO DECREASE YOUR SODIUM INTAKE 1. Avoid the use of added salt in cooking and at the table.  Table salt (and other prepared seasonings which contain salt) is probably one of the greatest sources of sodium in the diet.  Unsalted foods can gain flavor from the sweet, sour, and butter taste sensations of herbs and spices.  Instead of using salt for seasoning, try the following seasonings with the foods listed.  Remember: how you use them to enhance natural food flavors is limited only by your creativity... Allspice-Meat, fish, eggs, fruit, peas, red and yellow vegetables Almond Extract-Fruit baked goods Anise Seed-Sweet breads, fruit, carrots, beets, cottage cheese, cookies (tastes like licorice) Basil-Meat, fish, eggs, vegetables, rice, vegetables salads, soups, sauces Bay Leaf-Meat, fish, stews, poultry Burnet-Salad, vegetables (cucumber-like  flavor) Caraway Seed-Bread, cookies, cottage cheese, meat, vegetables, cheese, rice Cardamon-Baked goods, fruit, soups Celery Powder or seed-Salads, salad dressings, sauces, meatloaf, soup, bread.Do not use  celery salt Chervil-Meats, salads, fish, eggs, vegetables, cottage cheese (parsley-like flavor) Chili Power-Meatloaf, chicken cheese, corn, eggplant, egg dishes Chives-Salads cottage cheese, egg dishes, soups, vegetables, sauces Cilantro-Salsa,  casseroles Cinnamon-Baked goods, fruit, pork, lamb, chicken, carrots Cloves-Fruit, baked goods, fish, pot roast, green beans, beets, carrots Coriander-Pastry, cookies, meat, salads, cheese (lemon-orange flavor) Cumin-Meatloaf, fish,cheese, eggs, cabbage,fruit pie (caraway flavor) [employer], fruit, eggs, fish, poultry, cottage cheese, vegetables Dill Seed-Meat, cottage cheese, poultry, vegetables, fish, salads, bread Fennel Seed-Bread, cookies, apples, pork, eggs, fish, beets, cabbage, cheese, Licorice-like flavor Garlic-(buds or powder) Salads, meat, poultry, fish, bread, butter, vegetables, potatoes.Do not  use garlic salt Ginger-Fruit, vegetables, baked goods, meat, fish, poultry Horseradish Root-Meet, vegetables, butter Lemon Juice or Extract-Vegetables, fruit, tea, baked goods, fish salads Mace-Baked goods fruit, vegetables, fish, poultry (taste like nutmeg) Maple Extract-Syrups Marjoram-Meat, chicken, fish, vegetables, breads, green salads (taste like Sage) Mint-Tea, lamb, sherbet, vegetables, desserts, carrots, cabbage Mustard, Dry or Seed-Cheese, eggs, meats, vegetables, poultry Nutmeg-Baked goods, fruit, chicken, eggs, vegetables, desserts Onion Powder-Meat, fish, poultry, vegetables, cheese, eggs, bread, rice salads (Do not use   Onion salt) Orange Extract-Desserts, baked goods Oregano-Pasta, eggs, cheese, onions, pork, lamb, fish, chicken, vegetables, green salads Paprika-Meat, fish, poultry, eggs, cheese, vegetables Parsley Flakes-Butter, vegetables, meat fish, poultry, eggs, bread, salads (certain forms may   Contain sodium Pepper-Meat fish, poultry, vegetables, eggs Peppermint Extract-Desserts, baked goods Poppy Seed-Eggs, bread, cheese, fruit dressings, baked goods, noodles, vegetables, cottage  [employer], poultry, meat, fish, cauliflower, turnips,eggs bread Saffron-Rice, bread, veal, chicken, fish, eggs Sage-Meat, fish,  poultry, onions, eggplant, tomateos, pork, stews Savory-Eggs, salads, poultry, meat, rice, vegetables, soups, pork Tarragon-Meat, poultry, fish, eggs, butter, vegetables (licorice-like flavor)  Thyme-Meat, poultry, fish, eggs, vegetables, (clover-like flavor), sauces, soups Tumeric-Salads, butter, eggs, fish, rice, vegetables (saffron-like flavor) Vanilla Extract-Baked goods, candy Vinegar-Salads, vegetables, meat marinades Walnut Extract-baked goods, candy  2. Choose your Foods Wisely   The following is a list of foods to avoid which are high in sodium:  Meats-Avoid all smoked, canned, salt cured, dried and kosher meat and fish as well as Anchovies   [employer] meats:Bologna, Liverwurst, Pastrami Canned meat or fish  Marinated herring Caviar    Pepperoni Corned Beef   Pizza Dried chipped beef  Salami Frozen breaded fish or meat Salt pork Frankfurters or hot dogs  Sardines Gefilte fish   Sausage Ham (boiled ham, Proscuitto Smoked butt    spiced ham)   Spam      TV Dinners Vegetables Canned vegetables (Regular) Relish Canned mushrooms  Sauerkraut Olives    Tomato juice Pickles  Bakery and Dessert Products Canned puddings  Cream pies Cheesecake   Decorated cakes Cookies  Beverages/Juices Tomato juice, regular  Gatorade   V-8 vegetable juice, regular  Breads and Cereals Biscuit mixes   Salted potato chips, corn chips, pretzels Bread stuffing mixes  Salted crackers and rolls Pancake and waffle mixes Self-rising flour  Seasonings Accent    Meat sauces Barbecue sauce  Meat tenderizer Catsup    Monosodium glutamate (MSG) Celery salt   Onion salt Chili sauce   Prepared mustard Garlic salt   Salt, seasoned salt, sea salt Gravy mixes   Soy sauce Horseradish   Steak sauce Ketchup   Tartar sauce Lite salt    Teriyaki sauce Marinade mixes   Worcestershire sauce  Others  Baking powder   Cocoa and cocoa mixes Baking soda   Commercial casserole  mixes Candy-caramels, chocolate  Dehydrated soups    Bars, fudge,nougats  Instant rice and pasta mixes Canned broth or soup  Maraschino cherries Cheese, aged and processed cheese and cheese spreads  Learning Assessment Quiz  Indicated T (for True) or F (for False) for each of the following statements:  1. _____ Fresh fruits and vegetables and unprocessed grains are generally low in sodium 2. _____ Water may contain a considerable amount of sodium, depending on the source 3. _____ You can always tell if a food is high in sodium by tasting it 4. _____ Certain laxatives my be high in sodium and should be avoided unless prescribed   by a physician or pharmacist 5. _____ Salt substitutes may be used freely by anyone on a sodium restricted diet 6. _____ Sodium is present in table salt, food additives and as a natural component of   most foods 7. _____ Table salt is approximately 90% sodium 8. _____ Limiting sodium intake may help prevent excess fluid accumulation in the body 9. _____ On a sodium-restricted diet, seasonings such as bouillon soy sauce, and    cooking wine should be used in place of table salt 10. _____ On an ingredient list, a product which lists monosodium glutamate as the first   ingredient is an appropriate food to include on a low sodium diet  Circle the best answer(s) to the following statements (Hint: there may be more than one correct answer)  11. On a low-sodium diet, some acceptable snack items are:    A. Olives  F. Bean dip   K. Grapefruit juice    B. Salted Pretzels G. Commercial Popcorn   L. Canned peaches    C. Carrot Sticks  H. Bouillon   M. Unsalted nuts   D. [place] fries  I. Peanut butter crackers N. Salami   E. Sweet pickles J. Tomato Juice   O. Pizza  12.  Seasonings that may be used freely on a reduced - sodium diet include   A. Lemon wedges F.Monosodium glutamate K. Celery seed    B.Soysauce   G. Pepper   L. Mustard powder   C. Sea salt  H.  Cooking wine  M. Onion flakes   D. Vinegar  E. Prepared horseradish N. Salsa   E. Sage   J. Worcestershire sauce  O. Chutney

## 2019-02-01 ENCOUNTER — Ambulatory Visit: Payer: Medicare Other | Admitting: Interventional Cardiology

## 2019-02-15 ENCOUNTER — Emergency Department (HOSPITAL_COMMUNITY): Payer: Medicare Other

## 2019-02-15 ENCOUNTER — Emergency Department (HOSPITAL_COMMUNITY)
Admission: EM | Admit: 2019-02-15 | Discharge: 2019-02-15 | Disposition: A | Discharge status: Home / Self Care | Payer: Medicare Other | Attending: Emergency Medicine | Admitting: Emergency Medicine

## 2019-02-15 ENCOUNTER — Encounter (HOSPITAL_COMMUNITY): Payer: Self-pay | Admitting: Emergency Medicine

## 2019-02-15 ENCOUNTER — Other Ambulatory Visit: Payer: Self-pay

## 2019-02-15 DIAGNOSIS — I10 Essential (primary) hypertension: Secondary | ICD-10-CM | POA: Insufficient documentation

## 2019-02-15 DIAGNOSIS — F17220 Nicotine dependence, chewing tobacco, uncomplicated: Secondary | ICD-10-CM | POA: Diagnosis not present

## 2019-02-15 DIAGNOSIS — Z79899 Other long term (current) drug therapy: Secondary | ICD-10-CM | POA: Diagnosis not present

## 2019-02-15 DIAGNOSIS — Z7982 Long term (current) use of aspirin: Secondary | ICD-10-CM | POA: Diagnosis not present

## 2019-02-15 DIAGNOSIS — I2510 Atherosclerotic heart disease of native coronary artery without angina pectoris: Secondary | ICD-10-CM | POA: Diagnosis not present

## 2019-02-15 DIAGNOSIS — Z794 Long term (current) use of insulin: Secondary | ICD-10-CM | POA: Diagnosis not present

## 2019-02-15 DIAGNOSIS — Z96652 Presence of left artificial knee joint: Secondary | ICD-10-CM | POA: Insufficient documentation

## 2019-02-15 DIAGNOSIS — Z96641 Presence of right artificial hip joint: Secondary | ICD-10-CM | POA: Insufficient documentation

## 2019-02-15 DIAGNOSIS — Y998 Other external cause status: Secondary | ICD-10-CM | POA: Insufficient documentation

## 2019-02-15 DIAGNOSIS — Y9389 Activity, other specified: Secondary | ICD-10-CM | POA: Insufficient documentation

## 2019-02-15 DIAGNOSIS — E1140 Type 2 diabetes mellitus with diabetic neuropathy, unspecified: Secondary | ICD-10-CM | POA: Diagnosis not present

## 2019-02-15 DIAGNOSIS — S73004A Unspecified dislocation of right hip, initial encounter: Secondary | ICD-10-CM | POA: Insufficient documentation

## 2019-02-15 DIAGNOSIS — X509XXA Other and unspecified overexertion or strenuous movements or postures, initial encounter: Secondary | ICD-10-CM | POA: Diagnosis not present

## 2019-02-15 DIAGNOSIS — Y9289 Other specified places as the place of occurrence of the external cause: Secondary | ICD-10-CM | POA: Insufficient documentation

## 2019-02-15 DIAGNOSIS — Z049 Encounter for examination and observation for unspecified reason: Secondary | ICD-10-CM | POA: Diagnosis not present

## 2019-02-15 DIAGNOSIS — Z955 Presence of coronary angioplasty implant and graft: Secondary | ICD-10-CM | POA: Insufficient documentation

## 2019-02-15 DIAGNOSIS — S79911A Unspecified injury of right hip, initial encounter: Secondary | ICD-10-CM | POA: Diagnosis present

## 2019-02-15 LAB — CBC WITH DIFFERENTIAL/PLATELET
Abs Immature Granulocytes: 0.03 10*3/uL (ref 0.00–0.07)
Basophils Absolute: 0 10*3/uL (ref 0.0–0.1)
Basophils Relative: 0 %
Eosinophils Absolute: 0.1 10*3/uL (ref 0.0–0.5)
Eosinophils Relative: 2 %
HCT: 36.6 % — ABNORMAL LOW (ref 39.0–52.0)
Hemoglobin: 12.6 g/dL — ABNORMAL LOW (ref 13.0–17.0)
Immature Granulocytes: 0 %
Lymphocytes Relative: 13 %
Lymphs Abs: 1 10*3/uL (ref 0.7–4.0)
MCH: 31.7 pg (ref 26.0–34.0)
MCHC: 34.4 g/dL (ref 30.0–36.0)
MCV: 92.2 fL (ref 80.0–100.0)
Monocytes Absolute: 0.4 10*3/uL (ref 0.1–1.0)
Monocytes Relative: 5 %
Neutro Abs: 5.7 10*3/uL (ref 1.7–7.7)
Neutrophils Relative %: 80 %
Platelets: 129 10*3/uL — ABNORMAL LOW (ref 150–400)
RBC: 3.97 MIL/uL — ABNORMAL LOW (ref 4.22–5.81)
RDW: 14.3 % (ref 11.5–15.5)
WBC: 7.2 10*3/uL (ref 4.0–10.5)
nRBC: 0 % (ref 0.0–0.2)

## 2019-02-15 LAB — BASIC METABOLIC PANEL
Anion gap: 13 (ref 5–15)
BUN: 17 mg/dL (ref 8–23)
CO2: 25 mmol/L (ref 22–32)
Calcium: 9.4 mg/dL (ref 8.9–10.3)
Chloride: 107 mmol/L (ref 98–111)
Creatinine, Ser: 1.19 mg/dL (ref 0.61–1.24)
GFR calc Af Amer: >60 mL/min (ref >60)
GFR calc non Af Amer: 58 mL/min — ABNORMAL LOW (ref >60)
Glucose, Bld: 191 mg/dL — ABNORMAL HIGH (ref 70–99)
Potassium: 3.4 mmol/L — ABNORMAL LOW (ref 3.5–5.1)
Sodium: 145 mmol/L (ref 135–145)

## 2019-02-15 MED ORDER — SODIUM CHLORIDE 0.9 % IV SOLN
INTRAVENOUS | Status: AC | PRN
  Administered 2019-02-15: 1000 mL via INTRAVENOUS

## 2019-02-15 MED ORDER — PROPOFOL 10 MG/ML IV BOLUS
200.0000 mg | Freq: Once | INTRAVENOUS | Status: DC
  Filled 2019-02-15: qty 20

## 2019-02-15 MED ORDER — PROPOFOL 10 MG/ML IV BOLUS
INTRAVENOUS | Status: AC | PRN
  Administered 2019-02-15: 20 ug via INTRAVENOUS
  Administered 2019-02-15: 40 mg via INTRAVENOUS
  Administered 2019-02-15: 20 mg via INTRAVENOUS

## 2019-02-15 MED ORDER — TRAMADOL HCL 50 MG PO TABS: 50.0000 mg | ORAL_TABLET | Freq: Four times a day (QID) | ORAL | 0 refills | Status: DC | PRN

## 2019-02-15 NOTE — Progress Notes (Signed)
Preop diagnosis right hip posterior dislocation Postop diagnosis same Procedure closed reduction right hip dislocation Surgeon  Alphonzo Severance md Anesthesia: Dr. Lita Mains with conscious sedation Indications Keith Espinoza is a patient with right hip posterior dislocation sustained today.  Presents now for operative management after explanation of risks benefits Procedure in detail  Timeout was called.  Conscious sedation was initiated.  Once adequate relaxation was achieved the patient's right hip was flexed 80 adducted and traction was applied.  The hip was reduced with a palpable and audible clunk.  Leg lengths restored.  Internal rotation resolved.  Plain radiographs pending.  Patient tolerated procedure well without immediate complications and will be discharged home with knee immobilizer and follow-up with Dr. Lorin Mercy this week.

## 2019-02-15 NOTE — Discharge Instructions (Addendum)
Keep the knee immobilizer on at all times. Schedule an appointment with Dr. Lorin Mercy for further management and follow-up. You can take the tramadol every 6 hours as needed for severe pain.  For mild to moderate pain, take Tylenol every 4-6 hours.

## 2019-02-15 NOTE — ED Triage Notes (Addendum)
Pt arrives ems with dislocated rt hip. Hx of dislocated rt hip xr. Pt found up on bulldozer, while sitting pt was reaching for a branch and turned wrong felt it pop.   EMS administered 232mcg of fent @ 1500 CBG 217

## 2019-02-15 NOTE — ED Notes (Signed)
This RN is acting as Art therapist and received a call from pt's dtr, [name]. Pt's dtr left phone numbers: [name] Cell [phone] [place] cell [phone] Home: [phone] Pt's home number: [phone]  Will continue to keep dtr updated

## 2019-02-15 NOTE — Consult Note (Signed)
Reason for Consult: Right hip pain Referring Physician: Dr. Viviann Spare Keith Espinoza is an 78 y.o. male.  HPI: Keith Espinoza is a 78 year old patient who had total hip replacement performed in 2003 via posterior approach by Dr. Lorin Mercy.  He has had several dislocations.  The last dislocation was in August.  Today he was on his tractor and he flexed forward and the hip popped out.  He denies any other orthopedic complaints.  Past Medical History:  Diagnosis Date  . Allergic rhinitis   . Allergic rhinitis   . Arthritis   . Chronic leg pain    right  . Chronic lower back pain   . Coronary artery disease    a. Stenting to RCA 2004; staged DES to LAD and Cx 2004. DES to mRCA 2012. b. DES to mCx, PTCA to dCx 11/2011. c. Lateral wall MI 2013 s/p PTCA to distal Cx & DES to mid OM2 11/2011. d. Low risk nuc 04/2014, EF wnl.  . Diabetes mellitus    Insulin dependent  . Diabetic neuropathy (HCC)    MILD  . Diverticulosis   . Dysrhythmia   . Gilbert syndrome   . Gout    right wrist; right foot; right elbow; have had it since 1970's  . H/O hiatal hernia   . Heart murmur   . History of echocardiogram    aortic sclerosis per echo 12/09 EF 65%, otherwise normal  . History of hemorrhoids    BLEEDING  . Hypertension    Diagnosed 1995   . Pancreatic pseudocyst    a. s/p remote drainage 2006.  Marland Kitchen Thrombocytopenia ([place])    Seen on oldest labs in system from 2004  . Vitamin B 12 deficiency    orally replaced    Past Surgical History:  Procedure Laterality Date  . BACK SURGERY     "total of 3 times" S/P fall   . CARPAL TUNNEL RELEASE Bilateral   . CHOLECYSTECTOMY  1990's  . COLONOSCOPY    . CORONARY ANGIOPLASTY  11/11/11  . CORONARY ANGIOPLASTY WITH STENT PLACEMENT  09/30/2011   "1 then; makes a total of 4"  . CORONARY ANGIOPLASTY WITH STENT PLACEMENT  11/11/11   "1; makes a total of 5"  . INGUINAL HERNIA REPAIR  2003   right  . JOINT REPLACEMENT Right 04/03/2002   hip replacment  . KNEE ARTHROSCOPY   1990's   left  . LEFT HEART CATHETERIZATION WITH CORONARY ANGIOGRAM N/A 09/30/2011   Procedure: LEFT HEART CATHETERIZATION WITH CORONARY ANGIOGRAM;  Surgeon: Jettie Booze, MD;  Location: [facility] CATH LAB;  Service: Cardiovascular;  Laterality: N/A;  possible PCI  . LEFT HEART CATHETERIZATION WITH CORONARY ANGIOGRAM N/A 11/15/2011   Procedure: LEFT HEART CATHETERIZATION WITH CORONARY ANGIOGRAM;  Surgeon: Jettie Booze, MD;  Location: [facility] CATH LAB;  Service: Cardiovascular;  Laterality: N/A;  . PERCUTANEOUS CORONARY STENT INTERVENTION (PCI-S)  09/30/2011   Procedure: PERCUTANEOUS CORONARY STENT INTERVENTION (PCI-S);  Surgeon: Jettie Booze, MD;  Location: [facility] CATH LAB;  Service: Cardiovascular;;  . PERCUTANEOUS CORONARY STENT INTERVENTION (PCI-S) N/A 11/11/2011   Procedure: PERCUTANEOUS CORONARY STENT INTERVENTION (PCI-S);  Surgeon: Jettie Booze, MD;  Location: [facility] CATH LAB;  Service: Cardiovascular;  Laterality: N/A;  . SHOULDER SURGERY Right    X 2  . TONSILLECTOMY  ~ 1948  . TOTAL KNEE ARTHROPLASTY Left 07/23/2016   Procedure: LEFT TOTAL KNEE ARTHROPLASTY;  Surgeon: Marybelle Killings, MD;  Location: [place];  Service: Orthopedics;  Laterality: Left;  Family History  Problem Relation Age of Onset  . Diabetes Mother   . Hyperlipidemia Mother   . Hypertension Mother   . Cancer Father   . Hypertension Father   . Diabetes Sister   . Hypertension Sister   . Cancer Brother   . Heart attack Neg Hx     Social History:  reports that he has quit smoking. His smokeless tobacco use includes chew. He reports that he does not drink alcohol or use drugs.  Allergies:  Allergies  Allergen Reactions  . Simvastatin Other (See Comments)    SEVERE MYALGIAS   . Zetia [Ezetimibe] Other (See Comments)    MYALGIAS  . Dilaudid [Hydromorphone Hcl]     hallucination  . Fish Oil Nausea Only    Medications: I have reviewed the patient's current medications.  Results for orders placed or  performed during the hospital encounter of 02/15/19 (from the past 48 hour(s))  Basic metabolic panel     Status: Abnormal   Collection Time: 02/15/19  4:43 PM  Result Value Ref Range   Sodium 145 135 - 145 mmol/L   Potassium 3.4 (L) 3.5 - 5.1 mmol/L   Chloride 107 98 - 111 mmol/L   CO2 25 22 - 32 mmol/L   Glucose, Bld 191 (H) 70 - 99 mg/dL   BUN 17 8 - 23 mg/dL   Creatinine, Ser 1.19 0.61 - 1.24 mg/dL   Calcium 9.4 8.9 - 10.3 mg/dL   GFR calc non Af Amer 58 (L) >60 mL/min   GFR calc Af Amer >60 >60 mL/min   Anion gap 13 5 - 15    Comment: Performed at [facility] [address] [place], [place] [address]  CBC with Differential     Status: Abnormal   Collection Time: 02/15/19  4:43 PM  Result Value Ref Range   WBC 7.2 4.0 - 10.5 K/uL   RBC 3.97 (L) 4.22 - 5.81 MIL/uL   Hemoglobin 12.6 (L) 13.0 - 17.0 g/dL   HCT 36.6 (L) 39.0 - 52.0 %   MCV 92.2 80.0 - 100.0 fL   MCH 31.7 26.0 - 34.0 pg   MCHC 34.4 30.0 - 36.0 g/dL   RDW 14.3 11.5 - 15.5 %   Platelets 129 (L) 150 - 400 K/uL    Comment: REPEATED TO VERIFY   nRBC 0.0 0.0 - 0.2 %   Neutrophils Relative % 80 %   Neutro Abs 5.7 1.7 - 7.7 K/uL   Lymphocytes Relative 13 %   Lymphs Abs 1.0 0.7 - 4.0 K/uL   Monocytes Relative 5 %   Monocytes Absolute 0.4 0.1 - 1.0 K/uL   Eosinophils Relative 2 %   Eosinophils Absolute 0.1 0.0 - 0.5 K/uL   Basophils Relative 0 %   Basophils Absolute 0.0 0.0 - 0.1 K/uL   Immature Granulocytes 0 %   Abs Immature Granulocytes 0.03 0.00 - 0.07 K/uL    Comment: Performed at [facility], [place] [address] [place], [place] [address]    Dg Hip Unilat With Pelvis 2-3 Views Right  Result Date: 02/15/2019 CLINICAL DATA:  Hip dislocation. EXAM: DG HIP (WITH OR WITHOUT PELVIS) 2-3V RIGHT COMPARISON:  06/13/2018 FINDINGS: The patient is status post total hip arthroplasty. The hip is dislocated posteriorly and superiorly. There is no evidence of a periprosthetic fracture. Multiple phleboliths  project over the patient's pelvis. There are degenerative changes of the left hip. IMPRESSION: Dislocation of the patient's right hip arthroplasty as  detailed above. Post reduction radiographs are recommended. Electronically Signed   By: Constance Holster M.D.   On: 02/15/2019 17:18    Review of Systems  Musculoskeletal: Positive for joint pain.  All other systems reviewed and are negative.  Blood pressure (!) 161/82, pulse 88, temperature 98.4 F (36.9 C), resp. rate 16, height 5\' 10"  (1.778 m), weight 86.2 kg, SpO2 99 %. Physical Exam  Constitutional: He appears well-developed.  HENT:  Head: Normocephalic.  Eyes: Pupils are equal, round, and reactive to light.  Neck: Normal range of motion.  Cardiovascular: Normal rate.  Respiratory: Effort normal.  Neurological: He is alert.  Skin: Skin is warm.  Psychiatric: He has a normal mood and affect.  Right lower extremity is slightly shortened and internally rotated consistent with posterior dislocation.  Ankle dorsiflexion is intact.  Assessment/Plan: Impression is right posterior hip dislocation.  This is confirmed on radiographs.  Sciatic nerve is working.  Plan is conscious sedation with Dr. Lita Mains and attempted closed reduction.  Risk and benefits are discussed with the patient including not limited to infection fracture nerve damage as well as potential need to go to the operating room for general anesthesia in order to achieve reduction.  Patient understands the risk and benefits.  All questions answered  Anderson Malta 02/15/2019, 8:18 PM

## 2019-02-15 NOTE — ED Notes (Addendum)
Patient verbalizes understanding of discharge instructions. Opportunity for questioning and answers were provided. Armband removed by staff, pt discharged from ED with daughter, Dominic Pea.

## 2019-02-15 NOTE — ED Notes (Signed)
Patient transported to X-ray 

## 2019-02-15 NOTE — Progress Notes (Signed)
Orthopedic Tech Progress Note Patient Details:  Keith Espinoza Jun 21, 1941 [id number]  Ortho Devices Type of Ortho Device: Knee Immobilizer Ortho Device/Splint Interventions: Adjustment, Application, Ordered   Post Interventions Patient Tolerated: Well      Post Interventions Patient Tolerated: Well   Melony Overly T 02/15/2019, 8:23 PM

## 2019-02-15 NOTE — ED Provider Notes (Signed)
[facility] EMERGENCY DEPARTMENT Provider Note   CSN: [id number] Arrival date & time: 02/15/19  1634    History   Chief Complaint Chief Complaint  Patient presents with  . Hip Injury    HPI Keith Espinoza is a 78 y.o. male with past medical history of insulin-dependent type 2 diabetes, CAD, hypertension, status post right hip replacement with recurrent posterior dislocations, presenting to the emergency department with complaint of cute onset of right hip pain and suspected dislocation.  Patient states he was on a bulldozer and leaned forward to pull a branch off of his machine when he felt a pop and knew it dislocated.  He states the fentanyl provided in route provided improvement in symptoms, his pain is currently 2/10 severity.  He denies numbness or tingling to the right foot.  No other injuries reported. Of note, patient had right total hip replacement in 2003 and has had recurrent posterior dislocations.     The history is provided by the patient and medical records.    Past Medical History:  Diagnosis Date  . Allergic rhinitis   . Allergic rhinitis   . Arthritis   . Chronic leg pain    right  . Chronic lower back pain   . Coronary artery disease    a. Stenting to RCA 2004; staged DES to LAD and Cx 2004. DES to mRCA 2012. b. DES to mCx, PTCA to dCx 11/2011. c. Lateral wall MI 2013 s/p PTCA to distal Cx & DES to mid OM2 11/2011. d. Low risk nuc 04/2014, EF wnl.  . Diabetes mellitus    Insulin dependent  . Diabetic neuropathy (HCC)    MILD  . Diverticulosis   . Dysrhythmia   . Gilbert syndrome   . Gout    right wrist; right foot; right elbow; have had it since 1970's  . H/O hiatal hernia   . Heart murmur   . History of echocardiogram    aortic sclerosis per echo 12/09 EF 65%, otherwise normal  . History of hemorrhoids    BLEEDING  . Hypertension    Diagnosed 1995   . Pancreatic pseudocyst    a. s/p remote drainage 2006.  Marland Kitchen Thrombocytopenia ([place])     Seen on oldest labs in system from 2004  . Vitamin B 12 deficiency    orally replaced    Patient Active Problem List   Diagnosis Date Noted  . CAD (coronary artery disease) 01/30/2019  . Tobacco abuse 01/30/2019  . Posterior dislocation of right hip ([place]) 04/25/2018  . Burn, foot, second degree, left, initial encounter 06/08/2017  . 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 S/P total knee arthroplasty, left 10/26/2016  . Hyperlipidemia 09/04/2014  . Thrombocytopenia ([place])   . Precordial chest pain 04/05/2014  . Coronary atherosclerosis of native coronary artery 10/01/2013  . Other and unspecified hyperlipidemia 10/01/2013  . Essential hypertension, benign 10/01/2013  . Insulin dependent type 2 diabetes mellitus ([place]) 10/01/2013  . Esophageal reflux 10/01/2013  . Hypertrophy of prostate without urinary obstruction and other lower urinary tract symptoms (LUTS) 10/01/2013    Past Surgical History:  Procedure Laterality Date  . BACK SURGERY     "total of 3 times" S/P fall   . CARPAL TUNNEL RELEASE Bilateral   . CHOLECYSTECTOMY  1990's  . COLONOSCOPY    . CORONARY ANGIOPLASTY  11/11/11  . CORONARY ANGIOPLASTY WITH STENT PLACEMENT  09/30/2011   "1 then; makes a total of 4"  .  CORONARY ANGIOPLASTY WITH STENT PLACEMENT  11/11/11   "1; makes a total of 5"  . INGUINAL HERNIA REPAIR  2003   right  . JOINT REPLACEMENT Right 04/03/2002   hip replacment  . KNEE ARTHROSCOPY  1990's   left  . LEFT HEART CATHETERIZATION WITH CORONARY ANGIOGRAM N/A 09/30/2011   Procedure: LEFT HEART CATHETERIZATION WITH CORONARY ANGIOGRAM;  Surgeon: Jettie Booze, MD;  Location: [facility] CATH LAB;  Service: Cardiovascular;  Laterality: N/A;  possible PCI  . LEFT HEART CATHETERIZATION WITH CORONARY ANGIOGRAM N/A 11/15/2011   Procedure: LEFT HEART CATHETERIZATION WITH CORONARY ANGIOGRAM;  Surgeon: Jettie Booze, MD;  Location: [facility] CATH LAB;  Service: Cardiovascular;  Laterality: N/A;   . PERCUTANEOUS CORONARY STENT INTERVENTION (PCI-S)  09/30/2011   Procedure: PERCUTANEOUS CORONARY STENT INTERVENTION (PCI-S);  Surgeon: Jettie Booze, MD;  Location: [facility] CATH LAB;  Service: Cardiovascular;;  . PERCUTANEOUS CORONARY STENT INTERVENTION (PCI-S) N/A 11/11/2011   Procedure: PERCUTANEOUS CORONARY STENT INTERVENTION (PCI-S);  Surgeon: Jettie Booze, MD;  Location: [facility] CATH LAB;  Service: Cardiovascular;  Laterality: N/A;  . SHOULDER SURGERY Right    X 2  . TONSILLECTOMY  ~ 1948  . TOTAL KNEE ARTHROPLASTY Left 07/23/2016   Procedure: LEFT TOTAL KNEE ARTHROPLASTY;  Surgeon: Marybelle Killings, MD;  Location: [place];  Service: Orthopedics;  Laterality: Left;        Home Medications    Prior to Admission medications   Medication Sig Start Date End Date Taking? Authorizing Provider  allopurinol (ZYLOPRIM) 300 MG tablet Take 300 mg by mouth daily.     [provider]  amLODipine (NORVASC) 2.5 MG tablet Take 2.5 mg by mouth daily.    [provider]  aspirin EC 81 MG tablet Take 81 mg by mouth daily.    [provider]  Cholecalciferol (VITAMIN D3) 50 MCG (2000 UT) TABS Take 1 tablet by mouth daily.    [provider]  clopidogrel (PLAVIX) 75 MG tablet TAKE ONE TABLET BY MOUTH ONE TIME DAILY 07/18/13   Jettie Booze, MD  gabapentin (NEURONTIN) 300 MG capsule Take 600 mg by mouth 2 (two) times daily.  12/02/17   [provider]  glimepiride (AMARYL) 4 MG tablet Take 4 mg by mouth 2 (two) times daily.     [provider]  hydrochlorothiazide (MICROZIDE) 12.5 MG capsule Take 12.5 mg by mouth daily.  05/05/18   [provider]  insulin detemir (LEVEMIR) 100 UNIT/ML injection Inject 30-50 Units into the skin daily as needed (blood sugar). CBG 150-200 = 30 units, 200-250 = 45 units, CBG > 250 = 50 units     [provider]  isosorbide mononitrate (IMDUR) 30 MG 24 hr tablet Take 30 mg by mouth daily.    [provider]  KLOR-CON M20 20 MEQ tablet Take 20 mEq by mouth daily.  04/27/18   [provider]  losartan (COZAAR) 50 MG tablet Take 50 mg by mouth daily. 05/05/18   [provider]  metFORMIN (GLUCOPHAGE) 1000 MG tablet Take 1 tablet (1,000 mg total) by mouth 2 (two) times daily. 11/13/11   Jettie Booze, MD  nitroGLYCERIN (NITROSTAT) 0.4 MG SL tablet Place 0.4 mg under the tongue every 5 (five) minutes as needed for chest pain.    [provider]  ONE TOUCH ULTRA TEST test strip CHECK BLOOD SUGAR ONCE DAILY AS DIRECTED 09/23/17   [provider]  rosuvastatin (CRESTOR) 10 MG tablet Take 1 tablet (10  mg total) by mouth once a week. 01/05/18   Jettie Booze, MD  terazosin (HYTRIN) 5 MG capsule Take 5 mg by mouth at bedtime.     [provider]  traMADol (ULTRAM) 50 MG tablet Take 1 tablet (50 mg total) by mouth every 6 (six) hours as needed. 02/15/19   Robinson, [place] N, PA-C    Family History Family History  Problem Relation Age of Onset  . Diabetes Mother   . Hyperlipidemia Mother   . Hypertension Mother   . Cancer Father   . Hypertension Father   . Diabetes Sister   . Hypertension Sister   . Cancer Brother   . Heart attack Neg Hx     Social History Social History   Tobacco Use  . Smoking status: Former Research scientist (life sciences)  . Smokeless tobacco: Current User    Types: Chew  Substance Use Topics  . Alcohol use: No  . Drug use: No     Allergies   Simvastatin; Zetia [ezetimibe]; Dilaudid [hydromorphone hcl]; and Fish oil   Review of Systems Review of Systems  Musculoskeletal: Positive for arthralgias.  Neurological: Negative for numbness.  All other systems reviewed and are negative.    Physical Exam Updated Vital Signs BP (!) 175/88   Pulse 83   Temp 98.3 F (36.8 C) (Oral)   Resp 17   Ht 5\' 10"  (1.778 m)   Wt 86.2 kg   SpO2 98%   BMI 27.26 kg/m   Physical Exam Vitals signs and nursing note reviewed.   Constitutional:      General: He is not in acute distress.    Appearance: He is well-developed.  HENT:     Head: Normocephalic and atraumatic.  Eyes:     Conjunctiva/sclera: Conjunctivae normal.  Cardiovascular:     Rate and Rhythm: Regular rhythm. Tachycardia present.  Pulmonary:     Effort: Pulmonary effort is normal.     Breath sounds: Normal breath sounds.  Abdominal:     Palpations: Abdomen is soft.  Musculoskeletal:     Comments: Right lower extremity is internally rotated with some shortening.  There is no redness to the right knee or ankle.  Normal sensation to the right foot.  Patient is moving toes actively.  Intact dorsalis pedis pulse.  Foot is warm.  Skin:    General: Skin is warm.  Neurological:     Mental Status: He is alert.  Psychiatric:        Behavior: Behavior normal.      ED Treatments / Results  Labs (all labs ordered are listed, but only abnormal results are displayed) Labs Reviewed  BASIC METABOLIC PANEL - Abnormal; Notable for the following components:      Result Value   Potassium 3.4 (*)    Glucose, Bld 191 (*)    GFR calc non Af Amer 58 (*)    All other components within normal limits  CBC WITH DIFFERENTIAL/PLATELET - Abnormal; Notable for the following components:   RBC 3.97 (*)    Hemoglobin 12.6 (*)    HCT 36.6 (*)    Platelets 129 (*)    All other components within normal limits    EKG None  Radiology Dg Hip Port Unilat W Or Wo Pelvis 1 View Right  Result Date: 02/15/2019 CLINICAL DATA:  Status post reduction of right hip dislocation. EXAM: DG HIP (WITH OR WITHOUT PELVIS) 1V PORT RIGHT COMPARISON:  Plain films right hip earlier today. FINDINGS: Right total hip arthroplasty is  again seen. Dislocation has been reduced. No new abnormality. IMPRESSION: Successful reduction of dislocation.  No acute finding. Electronically Signed   By: Inge Rise M.D.   On: 02/15/2019 20:45   Dg Hip Unilat With Pelvis 2-3 Views Right  Result  Date: 02/15/2019 CLINICAL DATA:  Hip dislocation. EXAM: DG HIP (WITH OR WITHOUT PELVIS) 2-3V RIGHT COMPARISON:  06/13/2018 FINDINGS: The patient is status post total hip arthroplasty. The hip is dislocated posteriorly and superiorly. There is no evidence of a periprosthetic fracture. Multiple phleboliths project over the patient's pelvis. There are degenerative changes of the left hip. IMPRESSION: Dislocation of the patient's right hip arthroplasty as detailed above. Post reduction radiographs are recommended. Electronically Signed   By: Constance Holster M.D.   On: 02/15/2019 17:18    Procedures Procedures (including critical care time)  Medications Ordered in ED Medications  propofol (DIPRIVAN) 10 mg/mL bolus/IV push 200 mg (has no administration in time range)  propofol (DIPRIVAN) 10 mg/mL bolus/IV push (20 mg Intravenous Given 02/15/19 2012)  0.9 %  sodium chloride infusion ( Intravenous Stopped 02/15/19 2132)     Initial Impression / Assessment and Plan / ED Course  I have reviewed the triage vital signs and the nursing notes.  Pertinent labs & imaging results that were available during my care of the patient were reviewed by me and considered in my medical decision making (see chart for details).       Patient presenting with third right hip dislocation after total replacement in 2003.  Today, patient was on his bulldozer and leaned forward to remove a branch when he felt his hip dislocate.  On arrival, he is neurovascularly intact with obvious shortening and internal rotation of the right lower extremity.  X-ray with posterior and superior dislocation.  No fracture evident.  Patient requesting treatment per orthopedics, [facility] consulted. Dr. Marlou Sa evaluated patient in the ED and performed reduction at bedside under conscious sedation.  Successful reduction, neurovascularly intact following reduction.  Knee immobilizer placed per Ortho recommendations.  Will discharge with pain  medication and instructions to follow-up closely with Dr. Lorin Mercy.  Patient verbalized understanding and agrees with care plan.  Patient discussed with and evaluated by Dr. Lita Mains, who was present and performed procedural sedation, see his procedure note.  Discussed results, findings, treatment and follow up. Patient advised of return precautions. Patient verbalized understanding and agreed with plan.  [place] Controlled Substance reporting System queried   Final Clinical Impressions(s) / ED Diagnoses   Final diagnoses:  Dislocation of right hip, initial encounter [facility]    ED Discharge Orders         Ordered    traMADol (ULTRAM) 50 MG tablet  Every 6 hours PRN     02/15/19 2107           Robinson, [place] N, PA-C 02/15/19 2201    Julianne Rice, MD 02/17/19 [phone]

## 2019-02-17 NOTE — ED Provider Notes (Signed)
[facility] EMERGENCY DEPARTMENT Provider Note   CSN: [id number] Arrival date & time: 02/15/19  1634    History   Chief Complaint Chief Complaint  Patient presents with  . Hip Injury    Keith Espinoza is a 78 y.o. male.     Keith  Past Medical History:  Diagnosis Date  . Allergic rhinitis   . Allergic rhinitis   . Arthritis   . Chronic leg pain    right  . Chronic lower back pain   . Coronary artery disease    a. Stenting to RCA 2004; staged DES to LAD and Cx 2004. DES to mRCA 2012. b. DES to mCx, PTCA to dCx 11/2011. c. Lateral wall MI 2013 s/p PTCA to distal Cx & DES to mid OM2 11/2011. d. Low risk nuc 04/2014, EF wnl.  . Diabetes mellitus    Insulin dependent  . Diabetic neuropathy (HCC)    MILD  . Diverticulosis   . Dysrhythmia   . Gilbert syndrome   . Gout    right wrist; right foot; right elbow; have had it since 1970's  . H/O hiatal hernia   . Heart murmur   . History of echocardiogram    aortic sclerosis per echo 12/09 EF 65%, otherwise normal  . History of hemorrhoids    BLEEDING  . Hypertension    Diagnosed 1995   . Pancreatic pseudocyst    a. s/p remote drainage 2006.  Marland Kitchen Thrombocytopenia ([place])    Seen on oldest labs in system from 2004  . Vitamin B 12 deficiency    orally replaced    Patient Active Problem List   Diagnosis Date Noted  . CAD (coronary artery disease) 01/30/2019  . Tobacco abuse 01/30/2019  . Posterior dislocation of right hip ([place]) 04/25/2018  . Burn, foot, second degree, left, initial encounter 06/08/2017  . 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 S/P total knee arthroplasty, left 10/26/2016  . Hyperlipidemia 09/04/2014  . Thrombocytopenia ([place])   . Precordial chest pain 04/05/2014  . Coronary atherosclerosis of native coronary artery 10/01/2013  . Other and unspecified hyperlipidemia 10/01/2013  . Essential hypertension, benign 10/01/2013  . Insulin dependent type 2 diabetes  mellitus ([place]) 10/01/2013  . Esophageal reflux 10/01/2013  . Hypertrophy of prostate without urinary obstruction and other lower urinary tract symptoms (LUTS) 10/01/2013    Past Surgical History:  Procedure Laterality Date  . BACK SURGERY     "total of 3 times" S/P fall   . CARPAL TUNNEL RELEASE Bilateral   . CHOLECYSTECTOMY  1990's  . COLONOSCOPY    . CORONARY ANGIOPLASTY  11/11/11  . CORONARY ANGIOPLASTY WITH STENT PLACEMENT  09/30/2011   "1 then; makes a total of 4"  . CORONARY ANGIOPLASTY WITH STENT PLACEMENT  11/11/11   "1; makes a total of 5"  . INGUINAL HERNIA REPAIR  2003   right  . JOINT REPLACEMENT Right 04/03/2002   hip replacment  . KNEE ARTHROSCOPY  1990's   left  . LEFT HEART CATHETERIZATION WITH CORONARY ANGIOGRAM N/A 09/30/2011   Procedure: LEFT HEART CATHETERIZATION WITH CORONARY ANGIOGRAM;  Surgeon: Jettie Booze, MD;  Location: [facility] CATH LAB;  Service: Cardiovascular;  Laterality: N/A;  possible PCI  . LEFT HEART CATHETERIZATION WITH CORONARY ANGIOGRAM N/A 11/15/2011   Procedure: LEFT HEART CATHETERIZATION WITH CORONARY ANGIOGRAM;  Surgeon: Jettie Booze, MD;  Location[facility] C[facility] LAB;  Service: Cardiovascular;  Laterality: N/A;  . PERCUTANEOUS CORONARY STENT INTERVENTION (  PCI-S)  09/30/2011   Procedure: PERCUTANEOUS CORONARY STENT INTERVENTION (PCI-S);  Surgeon: Jettie Booze, MD;  Location: [facility] CATH LAB;  Service: Cardiovascular;;  . PERCUTANEOUS CORONARY STENT INTERVENTION (PCI-S) N/A 11/11/2011   Procedure: PERCUTANEOUS CORONARY STENT INTERVENTION (PCI-S);  Surgeon: Jettie Booze, MD;  Location: [facility] CATH LAB;  Service: Cardiovascular;  Laterality: N/A;  . SHOULDER SURGERY Right    X 2  . TONSILLECTOMY  ~ 1948  . TOTAL KNEE ARTHROPLASTY Left 07/23/2016   Procedure: LEFT TOTAL KNEE ARTHROPLASTY;  Surgeon: Marybelle Killings, MD;  Location: [place];  Service: Orthopedics;  Laterality: Left;        Home Medications    Prior to Admission medications    Medication Sig Start Date End Date Taking? Authorizing Provider  allopurinol (ZYLOPRIM) 300 MG tablet Take 300 mg by mouth daily.     [provider]  amLODipine (NORVASC) 2.5 MG tablet Take 2.5 mg by mouth daily.    [provider]  aspirin EC 81 MG tablet Take 81 mg by mouth daily.    [provider]  Cholecalciferol (VITAMIN D3) 50 MCG (2000 UT) TABS Take 1 tablet by mouth daily.    [provider]  clopidogrel (PLAVIX) 75 MG tablet TAKE ONE TABLET BY MOUTH ONE TIME DAILY 07/18/13   Jettie Booze, MD  gabapentin (NEURONTIN) 300 MG capsule Take 600 mg by mouth 2 (two) times daily.  12/02/17   [provider]  glimepiride (AMARYL) 4 MG tablet Take 4 mg by mouth 2 (two) times daily.     [provider]  hydrochlorothiazide (MICROZIDE) 12.5 MG capsule Take 12.5 mg by mouth daily.  05/05/18   [provider]  insulin detemir (LEVEMIR) 100 UNIT/ML injection Inject 30-50 Units into the skin daily as needed (blood sugar). CBG 150-200 = 30 units, 200-250 = 45 units, CBG > 250 = 50 units     [provider]  isosorbide mononitrate (IMDUR) 30 MG 24 hr tablet Take 30 mg by mouth daily.    [provider]  KLOR-CON M20 20 MEQ tablet Take 20 mEq by mouth daily.  04/27/18   [provider]  losartan (COZAAR) 50 MG tablet Take 50 mg by mouth daily. 05/05/18   [provider]  metFORMIN (GLUCOPHAGE) 1000 MG tablet Take 1 tablet (1,000 mg total) by mouth 2 (two) times daily. 11/13/11   Jettie Booze, MD  nitroGLYCERIN (NITROSTAT) 0.4 MG SL tablet Place 0.4 mg under the tongue every 5 (five) minutes as needed for chest pain.    [provider]  ONE TOUCH ULTRA TEST test strip CHECK BLOOD SUGAR ONCE DAILY AS DIRECTED 09/23/17   [provider]  rosuvastatin (CRESTOR) 10 MG tablet Take 1 tablet (10 mg total) by mouth once a week. 01/05/18   Jettie Booze, MD  terazosin (HYTRIN) 5 MG capsule  Take 5 mg by mouth at bedtime.     [provider]  traMADol (ULTRAM) 50 MG tablet Take 1 tablet (50 mg total) by mouth every 6 (six) hours as needed. 02/15/19   Robinson, [place] N, PA-C    Family History Family History  Problem Relation Age of Onset  . Diabetes Mother   . Hyperlipidemia Mother   . Hypertension Mother   . Cancer Father   . Hypertension Father   . Diabetes Sister   . Hypertension Sister   . Cancer Brother   . Heart attack Neg Hx     Social  History Social History   Tobacco Use  . Smoking status: Former Research scientist (life sciences)  . Smokeless tobacco: Current User    Types: Chew  Substance Use Topics  . Alcohol use: No  . Drug use: No     Allergies   Simvastatin; Zetia [ezetimibe]; Dilaudid [hydromorphone hcl]; and Fish oil   Review of Systems Review of Systems   Physical Exam Updated Vital Signs BP (!) 175/88   Pulse 83   Temp 98.3 F (36.8 C) (Oral)   Resp 17   Ht 5\' 10"  (1.778 m)   Wt 86.2 kg   SpO2 98%   BMI 27.26 kg/m   Physical Exam   ED Treatments / Results  Labs (all labs ordered are listed, but only abnormal results are displayed) Labs Reviewed  BASIC METABOLIC PANEL - Abnormal; Notable for the following components:      Result Value   Potassium 3.4 (*)    Glucose, Bld 191 (*)    GFR calc non Af Amer 58 (*)    All other components within normal limits  CBC WITH DIFFERENTIAL/PLATELET - Abnormal; Notable for the following components:   RBC 3.97 (*)    Hemoglobin 12.6 (*)    HCT 36.6 (*)    Platelets 129 (*)    All other components within normal limits    EKG None  Radiology Dg Hip Port Unilat W Or Wo Pelvis 1 View Right  Result Date: 02/15/2019 CLINICAL DATA:  Status post reduction of right hip dislocation. EXAM: DG HIP (WITH OR WITHOUT PELVIS) 1V PORT RIGHT COMPARISON:  Plain films right hip earlier today. FINDINGS: Right total hip arthroplasty is again seen. Dislocation has been reduced. No new abnormality. IMPRESSION:  Successful reduction of dislocation.  No acute finding. Electronically Signed   By: Inge Rise M.D.   On: 02/15/2019 20:45   Dg Hip Unilat With Pelvis 2-3 Views Right  Result Date: 02/15/2019 CLINICAL DATA:  Hip dislocation. EXAM: DG HIP (WITH OR WITHOUT PELVIS) 2-3V RIGHT COMPARISON:  06/13/2018 FINDINGS: The patient is status post total hip arthroplasty. The hip is dislocated posteriorly and superiorly. There is no evidence of a periprosthetic fracture. Multiple phleboliths project over the patient's pelvis. There are degenerative changes of the left hip. IMPRESSION: Dislocation of the patient's right hip arthroplasty as detailed above. Post reduction radiographs are recommended. Electronically Signed   By: Constance Holster M.D.   On: 02/15/2019 17:18    Procedures .Sedation Date/Time: 02/15/2019 7:07 PM Performed by: Julianne Rice, MD Authorized by: Julianne Rice, MD   Consent:    Consent obtained:  Written   Consent given by:  Patient   Risks discussed:  Allergic reaction, dysrhythmia, inadequate sedation, nausea, prolonged hypoxia resulting in organ damage, prolonged sedation necessitating reversal, respiratory compromise necessitating ventilatory assistance and intubation and vomiting   Alternatives discussed:  Analgesia without sedation, anxiolysis and regional anesthesia Universal protocol:    Procedure explained and questions answered to patient or proxy's satisfaction: yes     Relevant documents present and verified: yes     Test results available and properly labeled: yes     Imaging studies available: yes     Required blood products, implants, devices, and special equipment available: yes     Site/side marked: yes     Immediately prior to procedure a time out was called: yes     Patient identity confirmation method:  Verbally with patient Indications:    Procedure performed:  Dislocation reduction   Procedure necessitating sedation performed  by:  Different  physician Pre-sedation assessment:    Time since last food or drink:  8 hours   ASA classification: class 2 - patient with mild systemic disease     Neck mobility: normal     Mouth opening:  3 or more finger widths   Thyromental distance:  4 finger widths   Mallampati score:  I - soft palate, uvula, fauces, pillars visible   Pre-sedation assessments completed and reviewed: airway patency, cardiovascular function, hydration status, mental status, nausea/vomiting, pain level, respiratory function and temperature     Pre-sedation assessment completed:  02/15/2019 7:00 PM Immediate pre-procedure details:    Reassessment: Patient reassessed immediately prior to procedure     Reviewed: vital signs, relevant labs/tests and NPO status     Verified: bag valve mask available, emergency equipment available, intubation equipment available, IV patency confirmed, oxygen available and suction available   Procedure details (see MAR for exact dosages):    Preoxygenation:  Nasal cannula   Sedation:  Propofol   Intra-procedure monitoring:  Blood pressure monitoring, cardiac monitor, continuous pulse oximetry, frequent LOC assessments, frequent vital sign checks and continuous capnometry   Intra-procedure events: none     Total Provider sedation time (minutes):  20 Post-procedure details:    Post-sedation assessment completed:  02/15/2019 7:30 PM   Attendance: Constant attendance by certified staff until patient recovered     Recovery: Patient returned to pre-procedure baseline     Post-sedation assessments completed and reviewed: airway patency, cardiovascular function, hydration status, mental status, nausea/vomiting, pain level, respiratory function and temperature     Patient is stable for discharge or admission: yes     Patient tolerance:  Tolerated well, no immediate complications   (including critical care time)  Medications Ordered in ED Medications  propofol (DIPRIVAN) 10 mg/mL bolus/IV push (20  mg Intravenous Given 02/15/19 2012)  0.9 %  sodium chloride infusion ( Intravenous Stopped 02/15/19 2132)     Initial Impression / Assessment and Plan / ED Course  I have reviewed the triage vital signs and the nursing notes.  Pertinent labs & imaging results that were available during my care of the patient were reviewed by me and considered in my medical decision making (see chart for details).          Final Clinical Impressions(s) / ED Diagnoses   Final diagnoses:  Dislocation of right hip, initial encounter [facility])    ED Discharge Orders         Ordered    traMADol (ULTRAM) 50 MG tablet  Every 6 hours PRN     02/15/19 2107           Julianne Rice, MD 02/17/19 [phone]

## 2019-02-27 ENCOUNTER — Encounter: Payer: Self-pay | Admitting: Orthopaedic Surgery

## 2019-02-27 ENCOUNTER — Other Ambulatory Visit: Payer: Self-pay

## 2019-02-27 ENCOUNTER — Ambulatory Visit: Payer: Self-pay

## 2019-02-27 ENCOUNTER — Telehealth: Payer: Self-pay | Admitting: *Deleted

## 2019-02-27 ENCOUNTER — Ambulatory Visit (INDEPENDENT_AMBULATORY_CARE_PROVIDER_SITE_OTHER): Payer: Medicare Other | Admitting: Orthopaedic Surgery

## 2019-02-27 VITALS — Ht 70.0 in | Wt 190.0 lb

## 2019-02-27 DIAGNOSIS — M25551 Pain in right hip: Secondary | ICD-10-CM

## 2019-02-27 DIAGNOSIS — S73014D Posterior dislocation of right hip, subsequent encounter: Secondary | ICD-10-CM | POA: Diagnosis not present

## 2019-02-27 DIAGNOSIS — I2510 Atherosclerotic heart disease of native coronary artery without angina pectoris: Secondary | ICD-10-CM | POA: Diagnosis not present

## 2019-02-27 NOTE — Progress Notes (Signed)
Office Visit Note   Patient: Keith Espinoza           Date of Birth: December 26, 1940           MRN: [id number] Visit Date: 02/27/2019              Requested by: [name], MD [address] [place], [place] [address] PCP: [name], MD   Assessment & Plan: Visit Diagnoses:  1. Pain in right hip   2. Posterior dislocation of right hip, subsequent encounter     Plan: With patient's recurrent right total hip dislocations x3, best treatment option at this point would be total hip revision constrained liner.  Surgical procedure along with potential hospital stay discussed.  All questions answered.  Recommend that patient stay in his knee immobilizer in attempt to help prevent another dislocation.  We will see if we can get a letter of clearance from his cardiologist.  Follow-Up Instructions: Return for needs rov 2 weeks postop.   Orders:  Orders Placed This Encounter  Procedures  . XR HIP UNILAT W OR W/O PELVIS 1V RIGHT   No orders of the defined types were placed in this encounter.     Procedures: No procedures performed   Clinical Data: No additional findings.   Subjective: Chief Complaint  Patient presents with  . Right Hip - Dislocation, Follow-up    Dislocation 02/15/2019    HPI 78 year old white male who is status post right total hip replacement 2003 by retired physician Dr. Rudene Christians comes in for recurrent hip dislocation.  Patient had hip dislocations July 2019 and also September 2019 that were all reduced.  Third dislocation occurred Feb 15, 2019 when he was on a bulldozer and lean forward to remove a branch off the hood and his hip came out.  Dr. Alphonzo Severance reduced this in the emergency room. He was put in a knee immobilizer and advised to follow-up with Dr. Lorin Mercy today.  States he is not having any pain in his hip. Review of Systems No current cardiac pulmonary GI GU issues  Objective: Vital Signs: Ht 5\' 10"  (1.778 m)   Wt 190 lb (86.2 kg)    BMI 27.26 kg/m   Physical Exam HENT:     Head: Normocephalic.  Eyes:     Extraocular Movements: Extraocular movements intact.     Pupils: Pupils are equal, round, and reactive to light.  Cardiovascular:     Rate and Rhythm: Normal rate.  Pulmonary:     Effort: Pulmonary effort is normal. No respiratory distress.     Breath sounds: Normal breath sounds.  Abdominal:     General: Bowel sounds are normal. There is no distension.     Palpations: Abdomen is soft.  Musculoskeletal:     Comments: Gait is somewhat antalgic.  Knee immobilizer on.  Neurological:     General: No focal deficit present.     Mental Status: He is alert and oriented to person, place, and time.  Psychiatric:        Mood and Affect: Mood normal.     Ortho Exam  Specialty Comments:  No specialty comments available.  Imaging: No results found.   PMFS History: Patient Active Problem List   Diagnosis Date Noted  . CAD (coronary artery disease) 01/30/2019  . Tobacco abuse 01/30/2019  . Posterior dislocation of right hip ([place]) 04/25/2018  . Burn, foot, second degree, left, initial encounter 06/08/2017  . Sagittal band rupture at metacarpophalangeal  joint 03/16/2017  . S/P total knee arthroplasty, left 10/26/2016  . Hyperlipidemia 09/04/2014  . Thrombocytopenia ([place])   . Precordial chest pain 04/05/2014  . Coronary atherosclerosis of native coronary artery 10/01/2013  . Other and unspecified hyperlipidemia 10/01/2013  . Essential hypertension, benign 10/01/2013  . Insulin dependent type 2 diabetes mellitus ([place]) 10/01/2013  . Esophageal reflux 10/01/2013  . Hypertrophy of prostate without urinary obstruction and other lower urinary tract symptoms (LUTS) 10/01/2013   Past Medical History:  Diagnosis Date  . Allergic rhinitis   . Allergic rhinitis   . Arthritis   . Chronic leg pain    right  . Chronic lower back pain   . Coronary artery disease    a. Stenting to RCA 2004; staged DES to LAD and  Cx 2004. DES to mRCA 2012. b. DES to mCx, PTCA to dCx 11/2011. c. Lateral wall MI 2013 s/p PTCA to distal Cx & DES to mid OM2 11/2011. d. Low risk nuc 04/2014, EF wnl.  . Diabetes mellitus    Insulin dependent  . Diabetic neuropathy (HCC)    MILD  . Diverticulosis   . Dysrhythmia   . Gilbert syndrome   . Gout    right wrist; right foot; right elbow; have had it since 1970's  . H/O hiatal hernia   . Heart murmur   . History of echocardiogram    aortic sclerosis per echo 12/09 EF 65%, otherwise normal  . History of hemorrhoids    BLEEDING  . Hypertension    Diagnosed 1995   . Pancreatic pseudocyst    a. s/p remote drainage 2006.  Marland Kitchen Thrombocytopenia ([place])    Seen on oldest labs in system from 2004  . Vitamin B 12 deficiency    orally replaced    Family History  Problem Relation Age of Onset  . Diabetes Mother   . Hyperlipidemia Mother   . Hypertension Mother   . Cancer Father   . Hypertension Father   . Diabetes Sister   . Hypertension Sister   . Cancer Brother   . Heart attack Neg Hx     Past Surgical History:  Procedure Laterality Date  . BACK SURGERY     "total of 3 times" S/P fall   . CARPAL TUNNEL RELEASE Bilateral   . CHOLECYSTECTOMY  1990's  . COLONOSCOPY    . CORONARY ANGIOPLASTY  11/11/11  . CORONARY ANGIOPLASTY WITH STENT PLACEMENT  09/30/2011   "1 then; makes a total of 4"  . CORONARY ANGIOPLASTY WITH STENT PLACEMENT  11/11/11   "1; makes a total of 5"  . INGUINAL HERNIA REPAIR  2003   right  . JOINT REPLACEMENT Right 04/03/2002   hip replacment  . KNEE ARTHROSCOPY  1990's   left  . LEFT HEART CATHETERIZATION WITH CORONARY ANGIOGRAM N/A 09/30/2011   Procedure: LEFT HEART CATHETERIZATION WITH CORONARY ANGIOGRAM;  Surgeon: Jettie Booze, MD;  Location: [facility] CATH LAB;  Service: Cardiovascular;  Laterality: N/A;  possible PCI  . LEFT HEART CATHETERIZATION WITH CORONARY ANGIOGRAM N/A 11/15/2011   Procedure: LEFT HEART CATHETERIZATION WITH CORONARY ANGIOGRAM;   Surgeon: Jettie Booze, MD;  [facility] CATH LAB;  Service: Cardiovascular;  Laterality: N/A;  . PERCUTANEOUS CORONARY STENT INTERVENTION (PCI-S)  09/30/2011   Procedure: PERCUTANEOUS CORONARY STENT INTERVENTION (PCI-S);  Surgeon: Jettie Booze, MD;  Location: [facility] CATH LAB;  Service: Cardiovascular;;  . PERCUTANEOUS CORONARY STENT INTERVENTION (PCI-S) N/A 11/11/2011   Procedure: PERCUTANEOUS CORONARY STENT INTERVENTION (PCI-S);  Surgeon:  Jettie Booze, MD;  Location: [facility] CATH LAB;  Service: Cardiovascular;  Laterality: N/A;  . SHOULDER SURGERY Right    X 2  . TONSILLECTOMY  ~ 1948  . TOTAL KNEE ARTHROPLASTY Left 07/23/2016   Procedure: LEFT TOTAL KNEE ARTHROPLASTY;  Surgeon: Marybelle Killings, MD;  Location: [place];  Service: Orthopedics;  Laterality: Left;   Social History   Occupational History  . Occupation: Retired  Tobacco Use  . Smoking status: Former Research scientist (life sciences)  . Smokeless tobacco: Current User    Types: Chew  Substance and Sexual Activity  . Alcohol use: No  . Drug use: No  . Sexual activity: Not on file

## 2019-02-27 NOTE — Telephone Encounter (Signed)
   [facility] Pre-operative Risk Assessment    Request for surgical clearance:  1. What type of surgery is being performed? Right total hip revision   2. When is this surgery scheduled? TBD   3. What type of clearance is required (medical clearance vs. Pharmacy clearance to hold med vs. Both)? medical  4. Are there any medications that need to be held prior to surgery and how long? None listed   5. Practice name and name of physician performing surgery? [facility], Dr. Rodell Perna   6. What is your office phone number [phone]    7.   What is your office fax number [phone]  8.   Anesthesia type (None, local, MAC, general) ? Not listed.   Rodman Key 02/27/2019, 5:59 PM  _________________________________________________________________   (provider comments below)

## 2019-02-28 NOTE — Telephone Encounter (Signed)
Dr. Irish Lack, please comment on holding plavix for surgery.

## 2019-02-28 NOTE — Telephone Encounter (Addendum)
   Primary Cardiologist: Larae Grooms, MD  Chart reviewed as part of pre-operative protocol coverage. Patient was contacted 02/28/2019 in reference to pre-operative risk assessment for pending surgery as outlined below.  Keith Espinoza was last seen on 01/31/19 by Ermalinda Barrios.  Since that day, Keith Espinoza has done well. His mobility is limited since he was placed in a leg brace after his 3rd hip dislocation; however, he can still complete 4.0 METS. He denies any new or worsening cardiac symptoms.  The clearance request did not inquire about holding his antiplatelet therapy. I reached out to Dr. Irish Lack in case surgeon wants to hold.  Per Dr. Irish Lack: OK to hold plavix 5 days before surgery.   Therefore, based on ACC/AHA guidelines, the patient would be at acceptable risk for the planned procedure without further cardiovascular testing.   I will route this recommendation to the requesting party via Epic fax function and remove from pre-op pool.  Please call with questions.  Tami Lin Duke, PA 02/28/2019, 9:12 AM

## 2019-03-01 NOTE — Telephone Encounter (Signed)
OK to hold Plavix 5 days before surgery.

## 2019-03-03 ENCOUNTER — Telehealth: Payer: Self-pay | Admitting: Cardiology

## 2019-03-03 NOTE — Telephone Encounter (Signed)
Pt called back. Pt refused testing. Pt stated he is not having any symptoms and doesn't want to drive to get tested "because I drive back and forth to [facility] 4-5 times per week."  Provided pt with call back number in case he changes his mind. [phone].

## 2019-03-04 ENCOUNTER — Telehealth: Payer: Self-pay | Admitting: Adult Health

## 2019-03-12 ENCOUNTER — Telehealth: Payer: Self-pay | Admitting: Orthopaedic Surgery

## 2019-03-12 NOTE — Telephone Encounter (Signed)
Nevin Bloodgood, patient's daughter called and stated that they have talked with Cardiologist and they are waithing to hear back from surgery scheduler.  Please call patient.  [phone]

## 2019-03-13 ENCOUNTER — Telehealth: Payer: Self-pay | Admitting: Orthopaedic Surgery

## 2019-03-13 NOTE — Telephone Encounter (Signed)
Patient calling in wanting to set up surgery

## 2019-03-26 DIAGNOSIS — E538 Deficiency of other specified B group vitamins: Secondary | ICD-10-CM | POA: Diagnosis not present

## 2019-03-26 DIAGNOSIS — E785 Hyperlipidemia, unspecified: Secondary | ICD-10-CM | POA: Diagnosis not present

## 2019-03-26 DIAGNOSIS — E1140 Type 2 diabetes mellitus with diabetic neuropathy, unspecified: Secondary | ICD-10-CM | POA: Diagnosis not present

## 2019-03-26 DIAGNOSIS — M109 Gout, unspecified: Secondary | ICD-10-CM | POA: Diagnosis not present

## 2019-03-26 DIAGNOSIS — I25118 Atherosclerotic heart disease of native coronary artery with other forms of angina pectoris: Secondary | ICD-10-CM | POA: Diagnosis not present

## 2019-03-26 DIAGNOSIS — E559 Vitamin D deficiency, unspecified: Secondary | ICD-10-CM | POA: Diagnosis not present

## 2019-03-27 ENCOUNTER — Encounter: Payer: Self-pay | Admitting: Orthopaedic Surgery

## 2019-03-27 ENCOUNTER — Other Ambulatory Visit: Payer: Self-pay

## 2019-03-27 ENCOUNTER — Ambulatory Visit (INDEPENDENT_AMBULATORY_CARE_PROVIDER_SITE_OTHER): Payer: Medicare Other | Admitting: Orthopaedic Surgery

## 2019-03-27 VITALS — Ht 70.0 in | Wt 199.0 lb

## 2019-03-27 DIAGNOSIS — M24451 Recurrent dislocation, right hip: Secondary | ICD-10-CM | POA: Diagnosis not present

## 2019-03-27 DIAGNOSIS — I2510 Atherosclerotic heart disease of native coronary artery without angina pectoris: Secondary | ICD-10-CM | POA: Diagnosis not present

## 2019-03-27 DIAGNOSIS — M5136 Other intervertebral disc degeneration, lumbar region: Secondary | ICD-10-CM

## 2019-03-27 MED ORDER — TRAMADOL HCL 50 MG PO TABS: 50.0000 mg | ORAL_TABLET | Freq: Two times a day (BID) | ORAL | 0 refills | Status: DC | PRN

## 2019-03-27 NOTE — Progress Notes (Signed)
Office Visit Note   Patient: Keith Espinoza           Date of Birth: 03/03/1941           MRN: [id number] Visit Date: 03/27/2019              Requested by: Keith Huddle, MD [address] [place],  [place] [address] PCP: Keith Huddle, MD   Assessment & Plan: Visit Diagnoses:  1. Recurrent dislocation of right hip   2. Other intervertebral disc degeneration, lumbar region     Plan: We discussed possibility of repeating his MRI scan lumbar spine after his hip revision surgery.  His hip is popped out several times in the last year and needs to be corrected before he has another dislocation.  He may get some relief of his back pain once his hip is stable and he is ambulating better.  Risks of surgery for total hip arthroplasty revision with placement of restraining liner was discussed in detail.  Posterior approach discussed.  Cardiology note is reviewed in epic today.  Patient will be scheduled for routine preoperative labs.  Patient understands and requests we proceed.  Follow-Up Instructions: Return for one week post 7/10 hip surgery .   Orders:  No orders of the defined types were placed in this encounter.  Meds ordered this encounter  Medications  . traMADol (ULTRAM) 50 MG tablet    Sig: Take 1 tablet (50 mg total) by mouth every 12 (twelve) hours as needed.    Dispense:  30 tablet    Refill:  0      Procedures: No procedures performed   Clinical Data: No additional findings.   Subjective: Chief Complaint  Patient presents with  . Lower Back - Pain    HPI 78 year old male returns.  He is scheduled for right hip revision for repeat hip dislocations with conversion to a restrained polyethylene liner.  Original total hip arthroplasty done by Dr. Rudene Espinoza in 2003.  Since that time patient's had late hip dislocations in July 2019, September 2019 reduced in the ER.  Third dislocation occurred Feb 15, 2019 when he was on a bulldozer leaning forward to  remove a branch off the hood of the bulldozer and dislocated his hip again.  This was again reduced in emergency room by Dr. Alphonzo Espinoza.  He been treated with a knee immobilizer for 6 weeks but still has recurrent dislocations after 17 years of polyethylene wear.  Patient does not have any loosening of the stem or acetabular cup.  Patient's had no fever no chills normal white blood count.  He has had ongoing problems with back pain that got worse 2 weeks ago previous epidural by Dr. Ernestina Espinoza was on 12/18/2018.  Patient got relief he is used tramadol and Tylenol.  He states he is had some pain in his left leg sometimes it feels like it goes numb radiates into the anterior thigh and he has been amatory with a cane being extremely careful to avoid dislocating his hip before his revision surgery on 04/13/2019.  Previous lumbar MRI 2016 showed prominent left S1 disc protrusion on the left.  Postsurgical changes at L4-5.  Previous left total knee arthroplasty doing well.  He has had cardiac stents in the past 2013 and 2012.  Review of Systems 14 point review of system positive type 2 diabetes on insulin.  Prostate hypertrophy.  History of hyperlipidemia.  Previous left total knee arthroplasty.  Recurrent right hip  dislocations as listed above.  No current angina no shortness of breath.  History of chronic low back pain previous lumbar decompression surgery L4-5.   Objective: Vital Signs: Ht 5\' 10"  (1.778 m)   Wt 199 lb (90.3 kg)   BMI 28.55 kg/m   Physical Exam Constitutional:      Appearance: He is well-developed.  HENT:     Head: Normocephalic and atraumatic.  Eyes:     Pupils: Pupils are equal, round, and reactive to light.  Neck:     Thyroid: No thyromegaly.     Trachea: No tracheal deviation.  Cardiovascular:     Rate and Rhythm: Normal rate.  Pulmonary:     Effort: Pulmonary effort is normal.     Breath sounds: No wheezing.  Abdominal:     General: Bowel sounds are normal.     Palpations:  Abdomen is soft.  Skin:    General: Skin is warm and dry.     Capillary Refill: Capillary refill takes less than 2 seconds.  Neurological:     Mental Status: He is alert and oriented to person, place, and time.  Psychiatric:        Behavior: Behavior normal.        Thought Content: Thought content normal.        Judgment: Judgment normal.     Ortho Exam patient has well-healed right total hip arthroplasty incision well-healed total knee arthroplasty incision in his knee.  Distal pulses are intact.  He is Dealer with a cane.  Anterior tib EHL is intact.  Negative calf tenderness.  Good quad strength right and left.  Specialty Comments:  No specialty comments available.  Imaging: No results found.   PMFS History: Patient Active Problem List   Diagnosis Date Noted  . Other intervertebral disc degeneration, lumbar region 03/30/2019  . CAD (coronary artery disease) 01/30/2019  . Tobacco abuse 01/30/2019  . 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  . Burn, foot, second degree, left, initial encounter 06/08/2017  . 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 S/P total knee arthroplasty, left 10/26/2016  . Hyperlipidemia 09/04/2014  . Thrombocytopenia ([place])   . Precordial chest pain 04/05/2014  . Coronary atherosclerosis of native coronary artery 10/01/2013  . Other and unspecified hyperlipidemia 10/01/2013  . Essential hypertension, benign 10/01/2013  . Insulin dependent type 2 diabetes mellitus ([place]) 10/01/2013  . Esophageal reflux 10/01/2013  . Hypertrophy of prostate without urinary obstruction and other lower urinary tract symptoms (LUTS) 10/01/2013   Past Medical History:  Diagnosis Date  . Allergic rhinitis   . Allergic rhinitis   . Arthritis   . Chronic leg pain    right  . Chronic lower back pain   . Coronary artery disease    a. Stenting to RCA 2004; staged DES to LAD and Cx 2004. DES to mRCA 2012. b. DES to mCx, PTCA to dCx 11/2011. c.  Lateral wall MI 2013 s/p PTCA to distal Cx & DES to mid OM2 11/2011. d. Low risk nuc 04/2014, EF wnl.  . Diabetes mellitus    Insulin dependent  . Diabetic neuropathy (HCC)    MILD  . Diverticulosis   . Dysrhythmia   . Gilbert syndrome   . Gout    right wrist; right foot; right elbow; have had it since 1970's  . H/O hiatal hernia   . Heart murmur   . History of echocardiogram    aortic sclerosis per echo 12/09 EF 65%, otherwise normal  .  History of hemorrhoids    BLEEDING  . Hypertension    Diagnosed 1995   . Pancreatic pseudocyst    a. s/p remote drainage 2006.  Marland Kitchen Thrombocytopenia ([place])    Seen on oldest labs in system from 2004  . Vitamin B 12 deficiency    orally replaced    Family History  Problem Relation Age of Onset  . Diabetes Mother   . Hyperlipidemia Mother   . Hypertension Mother   . Cancer Father   . Hypertension Father   . Diabetes Sister   . Hypertension Sister   . Cancer Brother   . Heart attack Neg Hx     Past Surgical History:  Procedure Laterality Date  . BACK SURGERY     "total of 3 times" S/P fall   . CARPAL TUNNEL RELEASE Bilateral   . CHOLECYSTECTOMY  1990's  . COLONOSCOPY    . CORONARY ANGIOPLASTY  11/11/11  . CORONARY ANGIOPLASTY WITH STENT PLACEMENT  09/30/2011   "1 then; makes a total of 4"  . CORONARY ANGIOPLASTY WITH STENT PLACEMENT  11/11/11   "1; makes a total of 5"  . INGUINAL HERNIA REPAIR  2003   right  . JOINT REPLACEMENT Right 04/03/2002   hip replacment  . KNEE ARTHROSCOPY  1990's   left  . LEFT HEART CATHETERIZATION WITH CORONARY ANGIOGRAM N/A 09/30/2011   Procedure: LEFT HEART CATHETERIZATION WITH CORONARY ANGIOGRAM;  Surgeon: Jettie Booze, MD;  Location[facility] C[facility] LAB;  Service: Cardiovascular;  Laterality: N/A;  possible PCI  . LEFT HEART CATHETERIZATION WITH CORONARY ANGIOGRAM N/A 11/15/2011   Procedure: LEFT HEART CATHETERIZATION WITH CORONARY ANGIOGRAM;  Surgeon: Jettie Booze, MD;  Location: [facility] CATH LAB;  Service:  Cardiovascular;  Laterality: N/A;  . PERCUTANEOUS CORONARY STENT INTERVENTION (PCI-S)  09/30/2011   Procedure: PERCUTANEOUS CORONARY STENT INTERVENTION (PCI-S);  Surgeon: Jettie Booze, MD;  Location: [facility] CATH LAB;  Service: Cardiovascular;;  . PERCUTANEOUS CORONARY STENT INTERVENTION (PCI-S) N/A 11/11/2011   Procedure: PERCUTANEOUS CORONARY STENT INTERVENTION (PCI-S);  Surgeon: Jettie Booze, MD;  Location: [facility] CATH LAB;  Service: Cardiovascular;  Laterality: N/A;  . SHOULDER SURGERY Right    X 2  . TONSILLECTOMY  ~ 1948  . TOTAL KNEE ARTHROPLASTY Left 07/23/2016   Procedure: LEFT TOTAL KNEE ARTHROPLASTY;  Surgeon: Marybelle Killings, MD;  Location: [place] [facility];  Service: Orthopedics;  Laterality: Left;   Social History   Occupational History  . Occupation: Retired  Tobacco Use  . Smoking status: Former Research scientist (life sciences)  . Smokeless tobacco: Current User    Types: Chew  Substance and Sexual Activity  . Alcohol use: No  . Drug use: No  . Sexual activity: Not on file

## 2019-03-30 ENCOUNTER — Encounter: Payer: Self-pay | Admitting: Orthopaedic Surgery

## 2019-03-30 DIAGNOSIS — M5136 Other intervertebral disc degeneration, lumbar region: Secondary | ICD-10-CM | POA: Insufficient documentation

## 2019-04-02 DIAGNOSIS — Z794 Long term (current) use of insulin: Secondary | ICD-10-CM | POA: Diagnosis not present

## 2019-04-02 DIAGNOSIS — Z961 Presence of intraocular lens: Secondary | ICD-10-CM | POA: Diagnosis not present

## 2019-04-02 DIAGNOSIS — H353131 Nonexudative age-related macular degeneration, bilateral, early dry stage: Secondary | ICD-10-CM | POA: Diagnosis not present

## 2019-04-02 DIAGNOSIS — E119 Type 2 diabetes mellitus without complications: Secondary | ICD-10-CM | POA: Diagnosis not present

## 2019-04-09 NOTE — Progress Notes (Signed)
[facility] [place], [place] - [address] [place] [address] Phone: [phone] Fax: [phone]      Your procedure is scheduled on July 10th.  Report to [facility] Main Entrance "A" at 5:30 A.M., and check in at the Admitting office.  Call this number if you have problems the morning of surgery:  [phone]  Call [phone] if you have any questions prior to your surgery date Monday-Friday 8am-4pm    Remember:  Do not eat after midnight the night before your surgery  You may drink clear liquids until 4:30 AM the morning of your surgery.   Clear liquids allowed are: Water, Non-Citrus Juices (without pulp), Carbonated Beverages, Clear Tea, Black Coffee Only, and Gatorade  Please complete your PRE-SURGERY G2 Gatorade that was provided to you by 4:30 AM the morning of surgery.  Please, if able, drink it in one setting. DO NOT SIP.     Take these medicines the morning of surgery with A SIP OF WATER   Tylenol - if needed  Allopurinol  Amlodipine (Norvasc)  Gabapentin (Neurontin)  Nitroglycerin - if needed  Tramadol - if needed  Isosorbide Mononitrate (Imdur)  Follow your surgeon's instructions on when to stop Aspirin & Plavix.  If no instructions were given by your surgeon then you will need to call the office to get those instructions.    7 days prior to surgery STOP taking any Aleve, Naproxen, Ibuprofen, Motrin, Advil, Goody's, BC's, all herbal medications, fish oil, and all vitamins.   WHAT DO I DO ABOUT MY DIABETES MEDICATION?   Marland Kitchen Do not take oral diabetes medicines (pills) the morning of surgery. - Metformin, Glimepiride (Amaryl)  . THE NIGHT BEFORE SURGERY, do not take evening dose of Glimepiride (Amaryl)  o Levemir - take 50% of dose     . The day of surgery, do not take other diabetes injectables, including Byetta (exenatide), Bydureon (exenatide ER), Victoza (liraglutide),  or Trulicity (dulaglutide).  . If your CBG is greater than 220 mg/dL, you may take  of your sliding scale (correction) dose of insulin.   How to Manage Your Diabetes Before and After Surgery  Why is it important to control my blood sugar before and after surgery? . Improving blood sugar levels before and after surgery helps healing and can limit problems. . A way of improving blood sugar control is eating a healthy diet by: o  Eating less sugar and carbohydrates o  Increasing activity/exercise o  Talking with your doctor about reaching your blood sugar goals . High blood sugars (greater than 180 mg/dL) can raise your risk of infections and slow your recovery, so you will need to focus on controlling your diabetes during the weeks before surgery. . Make sure that the doctor who takes care of your diabetes knows about your planned surgery including the date and location.  How do I manage my blood sugar before surgery? . Check your blood sugar at least 4 times a day, starting 2 days before surgery, to make sure that the level is not too high or low. o Check your blood sugar the morning of your surgery when you wake up and every 2 hours until you get to the Short Stay unit. . If your blood sugar is less than 70 mg/dL, you will need to treat for low blood sugar: o Do not take insulin. o Treat a low blood  sugar (less than 70 mg/dL) with  cup of clear juice (cranberry or apple), 4 glucose tablets, OR glucose gel. o Recheck blood sugar in 15 minutes after treatment (to make sure it is greater than 70 mg/dL). If your blood sugar is not greater than 70 mg/dL on recheck, call [phone] for further instructions. . Report your blood sugar to the short stay nurse when you get to Short Stay.  . If you are admitted to the hospital after surgery: o Your blood sugar will be checked by the staff and you will probably be given insulin after surgery (instead of oral diabetes medicines) to make sure you  have good blood sugar levels. o The goal for blood sugar control after surgery is 80-180 mg/dL.     The Morning of Surgery  Do not wear jewelry.  Do not wear lotions, powders, colognes, or deodorant  Do not shave 48 hours prior to surgery.  Men may shave face and neck.  Do not bring valuables to the hospital.  [facility] is not responsible for any belongings or valuables.  If you are a smoker, DO NOT Smoke 24 hours prior to surgery IF you wear a CPAP at night please bring your mask, tubing, and machine the morning of surgery   Remember that you must have someone to transport you home after your surgery, and remain with you for 24 hours if you are discharged the same day.   Contacts, glasses, hearing aids, dentures or bridgework may not be worn into surgery.    Leave your suitcase in the car.  After surgery it may be brought to your room.  For patients admitted to the hospital, discharge time will be determined by your treatment team.  Patients discharged the day of surgery will not be allowed to drive home.    Special instructions:   [facility]- Preparing For Surgery  Before surgery, you can play an important role. Because skin is not sterile, your skin needs to be as free of germs as possible. You can reduce the number of germs on your skin by washing with CHG (chlorahexidine gluconate) Soap before surgery.  CHG is an antiseptic cleaner which kills germs and bonds with the skin to continue killing germs even after washing.    Oral Hygiene is also important to reduce your risk of infection.  Remember - BRUSH YOUR TEETH THE MORNING OF SURGERY WITH YOUR REGULAR TOOTHPASTE  Please do not use if you have an allergy to CHG or antibacterial soaps. If your skin becomes reddened/irritated stop using the CHG.  Do not shave (including legs and underarms) for at least 48 hours prior to first CHG shower. It is OK to shave your face.  Please follow these instructions  carefully.   1. Shower the NIGHT BEFORE SURGERY and the MORNING OF SURGERY with CHG Soap.   2. If you chose to wash your hair, wash your hair first as usual with your normal shampoo.  3. After you shampoo, rinse your hair and body thoroughly to remove the shampoo.  4. Use CHG as you would any other liquid soap. You can apply CHG directly to the skin and wash gently with a scrungie or a clean washcloth.   5. Apply the CHG Soap to your body ONLY FROM THE NECK DOWN.  Do not use on open wounds or open sores. Avoid contact with your eyes, ears, mouth and genitals (private parts). Wash Face and genitals (private parts)  with your normal soap.  6. Wash thoroughly, paying special attention to the area where your surgery will be performed.  7. Thoroughly rinse your body with warm water from the neck down.  8. DO NOT shower/wash with your normal soap after using and rinsing off the CHG Soap.  9. Pat yourself dry with a CLEAN TOWEL.  10. Wear CLEAN PAJAMAS to bed the night before surgery, wear comfortable clothes the morning of surgery  11. Place CLEAN SHEETS on your bed the night of your first shower and DO NOT SLEEP WITH PETS.    Day of Surgery:  Do not apply any deodorants/lotions. Please shower the morning of surgery with the CHG soap  Please wear clean clothes to the hospital/surgery center.   Remember to brush your teeth WITH YOUR REGULAR TOOTHPASTE.   Please read over the following fact sheets that you were given.

## 2019-04-10 ENCOUNTER — Ambulatory Visit (HOSPITAL_COMMUNITY)
Admission: RE | Admit: 2019-04-10 | Discharge: 2019-04-10 | Disposition: A | Discharge status: Home / Self Care | Payer: Medicare Other | Source: Ambulatory Visit | Attending: Surgery | Admitting: Surgery

## 2019-04-10 ENCOUNTER — Other Ambulatory Visit (HOSPITAL_COMMUNITY)
Admission: RE | Admit: 2019-04-10 | Discharge: 2019-04-10 | Disposition: A | Discharge status: Home / Self Care | Payer: Medicare Other | Source: Ambulatory Visit | Attending: Orthopaedic Surgery | Admitting: Orthopaedic Surgery

## 2019-04-10 ENCOUNTER — Encounter (HOSPITAL_COMMUNITY): Payer: Self-pay

## 2019-04-10 ENCOUNTER — Other Ambulatory Visit: Payer: Self-pay

## 2019-04-10 ENCOUNTER — Encounter (HOSPITAL_COMMUNITY)
Admission: RE | Admit: 2019-04-10 | Discharge: 2019-04-10 | Disposition: A | Discharge status: Home / Self Care | Payer: Medicare Other | Source: Ambulatory Visit | Attending: Orthopaedic Surgery | Admitting: Orthopaedic Surgery

## 2019-04-10 DIAGNOSIS — M109 Gout, unspecified: Secondary | ICD-10-CM | POA: Diagnosis not present

## 2019-04-10 DIAGNOSIS — Z01811 Encounter for preprocedural respiratory examination: Secondary | ICD-10-CM | POA: Diagnosis not present

## 2019-04-10 DIAGNOSIS — E1140 Type 2 diabetes mellitus with diabetic neuropathy, unspecified: Secondary | ICD-10-CM | POA: Diagnosis not present

## 2019-04-10 DIAGNOSIS — M1990 Unspecified osteoarthritis, unspecified site: Secondary | ICD-10-CM | POA: Diagnosis not present

## 2019-04-10 DIAGNOSIS — I2510 Atherosclerotic heart disease of native coronary artery without angina pectoris: Secondary | ICD-10-CM | POA: Diagnosis not present

## 2019-04-10 DIAGNOSIS — Z01818 Encounter for other preprocedural examination: Secondary | ICD-10-CM

## 2019-04-10 DIAGNOSIS — Y828 Other medical devices associated with adverse incidents: Secondary | ICD-10-CM | POA: Diagnosis not present

## 2019-04-10 DIAGNOSIS — I10 Essential (primary) hypertension: Secondary | ICD-10-CM | POA: Diagnosis not present

## 2019-04-10 DIAGNOSIS — Z03818 Encounter for observation for suspected exposure to other biological agents ruled out: Secondary | ICD-10-CM | POA: Diagnosis not present

## 2019-04-10 DIAGNOSIS — G8929 Other chronic pain: Secondary | ICD-10-CM | POA: Diagnosis not present

## 2019-04-10 DIAGNOSIS — T84020A Dislocation of internal right hip prosthesis, initial encounter: Secondary | ICD-10-CM | POA: Diagnosis not present

## 2019-04-10 DIAGNOSIS — S73004A Unspecified dislocation of right hip, initial encounter: Secondary | ICD-10-CM | POA: Diagnosis not present

## 2019-04-10 DIAGNOSIS — L905 Scar conditions and fibrosis of skin: Secondary | ICD-10-CM | POA: Diagnosis not present

## 2019-04-10 DIAGNOSIS — D62 Acute posthemorrhagic anemia: Secondary | ICD-10-CM | POA: Diagnosis not present

## 2019-04-10 DIAGNOSIS — Z1159 Encounter for screening for other viral diseases: Secondary | ICD-10-CM | POA: Diagnosis not present

## 2019-04-10 DIAGNOSIS — J309 Allergic rhinitis, unspecified: Secondary | ICD-10-CM | POA: Diagnosis not present

## 2019-04-10 HISTORY — DX: Personal history of urinary calculi: Z87.442

## 2019-04-10 LAB — COMPREHENSIVE METABOLIC PANEL
ALT: 18 U/L (ref 0–44)
AST: 19 U/L (ref 15–41)
Albumin: 4.4 g/dL (ref 3.5–5.0)
Alkaline Phosphatase: 65 U/L (ref 38–126)
Anion gap: 10 (ref 5–15)
BUN: 23 mg/dL (ref 8–23)
CO2: 28 mmol/L (ref 22–32)
Calcium: 9.9 mg/dL (ref 8.9–10.3)
Chloride: 102 mmol/L (ref 98–111)
Creatinine, Ser: 1.37 mg/dL — ABNORMAL HIGH (ref 0.61–1.24)
GFR calc Af Amer: 57 mL/min — ABNORMAL LOW (ref >60)
GFR calc non Af Amer: 49 mL/min — ABNORMAL LOW (ref >60)
Glucose, Bld: 213 mg/dL — ABNORMAL HIGH (ref 70–99)
Potassium: 3.7 mmol/L (ref 3.5–5.1)
Sodium: 140 mmol/L (ref 135–145)
Total Bilirubin: 0.9 mg/dL (ref 0.3–1.2)
Total Protein: 6.8 g/dL (ref 6.5–8.1)

## 2019-04-10 LAB — GLUCOSE, CAPILLARY: Glucose-Capillary: 227 mg/dL — ABNORMAL HIGH (ref 70–99)

## 2019-04-10 LAB — URINALYSIS, COMPLETE (UACMP) WITH MICROSCOPIC
Bilirubin Urine: NEGATIVE
Glucose, UA: 50 mg/dL — AB
Hgb urine dipstick: NEGATIVE
Ketones, ur: NEGATIVE mg/dL
Leukocytes,Ua: NEGATIVE
Nitrite: NEGATIVE
Protein, ur: 100 mg/dL — AB
Specific Gravity, Urine: 1.021 (ref 1.005–1.030)
pH: 5 (ref 5.0–8.0)

## 2019-04-10 LAB — CBC
HCT: 40.1 % (ref 39.0–52.0)
Hemoglobin: 13.9 g/dL (ref 13.0–17.0)
MCH: 32 pg (ref 26.0–34.0)
MCHC: 34.7 g/dL (ref 30.0–36.0)
MCV: 92.4 fL (ref 80.0–100.0)
Platelets: 138 10*3/uL — ABNORMAL LOW (ref 150–400)
RBC: 4.34 MIL/uL (ref 4.22–5.81)
RDW: 14.3 % (ref 11.5–15.5)
WBC: 5.8 10*3/uL (ref 4.0–10.5)
nRBC: 0 % (ref 0.0–0.2)

## 2019-04-10 LAB — PROTIME-INR
INR: 1.1 (ref 0.8–1.2)
Prothrombin Time: 13.9 seconds (ref 11.4–15.2)

## 2019-04-10 LAB — SURGICAL PCR SCREEN
MRSA, PCR: NEGATIVE
Staphylococcus aureus: NEGATIVE

## 2019-04-10 LAB — TYPE AND SCREEN
ABO/RH(D): O POS
Antibody Screen: NEGATIVE

## 2019-04-10 LAB — HEMOGLOBIN A1C
Hgb A1c MFr Bld: 6.6 % — ABNORMAL HIGH (ref 4.8–5.6)
Mean Plasma Glucose: 142.72 mg/dL

## 2019-04-10 LAB — APTT: aPTT: 28 seconds (ref 24–36)

## 2019-04-10 LAB — SARS CORONAVIRUS 2 (TAT 6-24 HRS): SARS Coronavirus 2: NEGATIVE

## 2019-04-10 NOTE — Progress Notes (Signed)
PCP - Reginia Forts Cardiologist - Varandsi  Chest x-ray - today EKG - today Stress Test - . 7 yrs.ECHO - na Cardiac Cath - na  Sleep Study - na CPAP -   Fasting Blood Sugar - pt. Doesn't know states he checks at hs Checks Blood Sugar __1___ times a day @ bedtime  Blood Thinner Instructions:stopped 04/07/19 Aspirin Instructions: stopped 04/07/19  Anesthesia review: cardiac hx.  Patient denies shortness of breath, fever, cough and chest pain at PAT appointment   Patient verbalized understanding of instructions that were given to them at the PAT appointment. Patient was also instructed that they will need to review over the PAT instructions again at home before surgery.

## 2019-04-11 ENCOUNTER — Emergency Department (HOSPITAL_COMMUNITY): Payer: Medicare Other

## 2019-04-11 ENCOUNTER — Emergency Department (HOSPITAL_COMMUNITY)
Admission: EM | Admit: 2019-04-11 | Discharge: 2019-04-11 | Disposition: A | Discharge status: Home / Self Care | Payer: Medicare Other | Source: Home / Self Care | Attending: Emergency Medicine | Admitting: Emergency Medicine

## 2019-04-11 ENCOUNTER — Encounter (HOSPITAL_COMMUNITY): Payer: Self-pay

## 2019-04-11 DIAGNOSIS — S73004A Unspecified dislocation of right hip, initial encounter: Secondary | ICD-10-CM | POA: Diagnosis not present

## 2019-04-11 DIAGNOSIS — Z96641 Presence of right artificial hip joint: Secondary | ICD-10-CM | POA: Insufficient documentation

## 2019-04-11 DIAGNOSIS — I2510 Atherosclerotic heart disease of native coronary artery without angina pectoris: Secondary | ICD-10-CM | POA: Insufficient documentation

## 2019-04-11 DIAGNOSIS — T84020A Dislocation of internal right hip prosthesis, initial encounter: Secondary | ICD-10-CM | POA: Diagnosis not present

## 2019-04-11 DIAGNOSIS — Z7902 Long term (current) use of antithrombotics/antiplatelets: Secondary | ICD-10-CM | POA: Insufficient documentation

## 2019-04-11 DIAGNOSIS — I10 Essential (primary) hypertension: Secondary | ICD-10-CM | POA: Insufficient documentation

## 2019-04-11 DIAGNOSIS — W1830XA Fall on same level, unspecified, initial encounter: Secondary | ICD-10-CM | POA: Diagnosis not present

## 2019-04-11 DIAGNOSIS — Z7982 Long term (current) use of aspirin: Secondary | ICD-10-CM | POA: Insufficient documentation

## 2019-04-11 DIAGNOSIS — Z79899 Other long term (current) drug therapy: Secondary | ICD-10-CM | POA: Insufficient documentation

## 2019-04-11 DIAGNOSIS — Z1159 Encounter for screening for other viral diseases: Secondary | ICD-10-CM | POA: Insufficient documentation

## 2019-04-11 DIAGNOSIS — Y828 Other medical devices associated with adverse incidents: Secondary | ICD-10-CM | POA: Diagnosis not present

## 2019-04-11 DIAGNOSIS — Z794 Long term (current) use of insulin: Secondary | ICD-10-CM | POA: Insufficient documentation

## 2019-04-11 DIAGNOSIS — Z049 Encounter for examination and observation for unspecified reason: Secondary | ICD-10-CM | POA: Diagnosis not present

## 2019-04-11 DIAGNOSIS — Z03818 Encounter for observation for suspected exposure to other biological agents ruled out: Secondary | ICD-10-CM | POA: Diagnosis not present

## 2019-04-11 DIAGNOSIS — R52 Pain, unspecified: Secondary | ICD-10-CM

## 2019-04-11 DIAGNOSIS — E119 Type 2 diabetes mellitus without complications: Secondary | ICD-10-CM | POA: Insufficient documentation

## 2019-04-11 DIAGNOSIS — M24451 Recurrent dislocation, right hip: Secondary | ICD-10-CM | POA: Insufficient documentation

## 2019-04-11 DIAGNOSIS — F17220 Nicotine dependence, chewing tobacco, uncomplicated: Secondary | ICD-10-CM | POA: Insufficient documentation

## 2019-04-11 DIAGNOSIS — S73014A Posterior dislocation of right hip, initial encounter: Secondary | ICD-10-CM

## 2019-04-11 LAB — SARS CORONAVIRUS 2 BY RT PCR (HOSPITAL ORDER, PERFORMED IN ~~LOC~~ HOSPITAL LAB): SARS Coronavirus 2: NEGATIVE

## 2019-04-11 MED ORDER — PROPOFOL 10 MG/ML IV BOLUS
INTRAVENOUS | Status: AC | PRN
  Administered 2019-04-11: 20 mg via INTRAVENOUS
  Administered 2019-04-11 (×2): 30 mg via INTRAVENOUS
  Administered 2019-04-11: 20 mg via INTRAVENOUS

## 2019-04-11 MED ORDER — PROPOFOL 10 MG/ML IV BOLUS
0.5000 mg/kg | Freq: Once | INTRAVENOUS | Status: AC
  Administered 2019-04-11: 44.6 mg via INTRAVENOUS
  Filled 2019-04-11: qty 20

## 2019-04-11 NOTE — ED Notes (Signed)
Bed: WA17 Expected date:  Expected time:  Means of arrival:  Comments: EMS/hip dislocation

## 2019-04-11 NOTE — Anesthesia Preprocedure Evaluation (Addendum)
Anesthesia Evaluation  Patient identified by MRN, date of birth, ID band Patient awake    Reviewed: Allergy & Precautions, NPO status , Patient's Chart, lab work & pertinent test results  Airway Mallampati: II  TM Distance: >3 FB Neck ROM: Full    Dental  (+) Dental Advisory Given   Pulmonary neg pulmonary ROS, former smoker,    Pulmonary exam normal breath sounds clear to auscultation       Cardiovascular hypertension, + CAD  Normal cardiovascular exam+ dysrhythmias + Valvular Problems/Murmurs  Rhythm:Regular Rate:Normal     Neuro/Psych negative neurological ROS     GI/Hepatic Neg liver ROS, hiatal hernia, GERD  ,  Endo/Other  negative endocrine ROSdiabetes  Renal/GU negative Renal ROS     Musculoskeletal  (+) Arthritis ,   Abdominal   Peds  Hematology negative hematology ROS (+)   Anesthesia Other Findings Day of surgery medications reviewed with the patient.  Reproductive/Obstetrics                           Anesthesia Physical Anesthesia Plan  ASA: III  Anesthesia Plan: General   Post-op Pain Management:    Induction:   PONV Risk Score and Plan: 3 and Ondansetron, Dexamethasone and Treatment may vary due to age or medical condition  Airway Management Planned: Oral ETT  Additional Equipment:   Intra-op Plan:   Post-operative Plan: Extubation in OR  Informed Consent: I have reviewed the patients History and Physical, chart, labs and discussed the procedure including the risks, benefits and alternatives for the proposed anesthesia with the patient or authorized representative who has indicated his/her understanding and acceptance.     Dental advisory given  Plan Discussed with: CRNA  Anesthesia Plan Comments: (Follows with cardiology for CAD S/P stenting RCA 2004 with staged DES-LAD and Cfx 2004, DES RCA 2012, DES mCfx, PTCA dCfx 2013. Clearance 02/28/19 states "Keith Espinoza was last seen on 01/31/19 by Ermalinda Barrios.  Since that day, Keith Espinoza has done well. His mobility is limited since he was placed in a leg brace after his 3rd hip dislocation; however, he can still complete 4.0 METS. He denies any new or worsening cardiac symptoms."  Nuclear stress 04/06/14: IMPRESSION: Inferolateral scar.  Pt took plavix 04/07/2019 so 6 days ago. Plan GETA.  No findings for myocardial ischemia.  Ejection fraction calculated at 57%)      Anesthesia Quick Evaluation

## 2019-04-11 NOTE — ED Notes (Signed)
Patient transported to X-ray 

## 2019-04-11 NOTE — ED Notes (Signed)
Discharge paperwork reviewed with pt, IVs removed.  Pt AOx4, NAD, able to speak full sentences and full ROM.  Pt stated his granddaughter was here to pick him up, was wheeled to exit.

## 2019-04-11 NOTE — Discharge Instructions (Signed)
You were seen in the emergency department for recurrent right hip dislocation.  You were given sedation and your hip was reduced and is in good position now.  Please call Dr. Lorin Mercy for close follow-up.  Return if any concerns.

## 2019-04-11 NOTE — ED Triage Notes (Addendum)
Patient arrived via [facility]. Patient is AOx4 and ambulatory at  Baseline however not currently. Patient was outside doing yard work today and slipped and fell. Right leg is shortened with inward rotation. Patient was scheduled to have right hip operated on Friday. Patient denies dizziness, LOC, or head, neck and back pain.   Patient has been given 240mcg Fentanyl IV 141mcg before tranpsort and 192mcg upon arrival.   86 HR BP 170/90 CBG 181 97.5 Oral Temp 96% O2 on room air
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
## 2019-04-13 ENCOUNTER — Inpatient Hospital Stay (HOSPITAL_COMMUNITY): Payer: Medicare Other

## 2019-04-13 ENCOUNTER — Encounter (HOSPITAL_COMMUNITY): Payer: Self-pay | Admitting: *Deleted

## 2019-04-13 ENCOUNTER — Inpatient Hospital Stay (HOSPITAL_COMMUNITY): Payer: Medicare Other | Admitting: Physician Assistant

## 2019-04-13 ENCOUNTER — Inpatient Hospital Stay (HOSPITAL_COMMUNITY)
Admission: RE | Admit: 2019-04-13 | Discharge: 2019-04-15 | DRG: 467 | Disposition: A | Discharge status: Home Health | Payer: Medicare Other | Attending: Orthopaedic Surgery | Admitting: Orthopaedic Surgery

## 2019-04-13 ENCOUNTER — Encounter (HOSPITAL_COMMUNITY)
Admission: RE | Disposition: A | Discharge status: Home / Self Care | Payer: Self-pay | Source: Home / Self Care | Attending: Orthopaedic Surgery

## 2019-04-13 ENCOUNTER — Other Ambulatory Visit: Payer: Self-pay

## 2019-04-13 DIAGNOSIS — M5136 Other intervertebral disc degeneration, lumbar region: Secondary | ICD-10-CM | POA: Diagnosis present

## 2019-04-13 DIAGNOSIS — Y792 Prosthetic and other implants, materials and accessory orthopedic devices associated with adverse incidents: Secondary | ICD-10-CM | POA: Diagnosis present

## 2019-04-13 DIAGNOSIS — J309 Allergic rhinitis, unspecified: Secondary | ICD-10-CM | POA: Diagnosis not present

## 2019-04-13 DIAGNOSIS — Z96652 Presence of left artificial knee joint: Secondary | ICD-10-CM | POA: Diagnosis present

## 2019-04-13 DIAGNOSIS — D696 Thrombocytopenia, unspecified: Secondary | ICD-10-CM | POA: Diagnosis not present

## 2019-04-13 DIAGNOSIS — Z7982 Long term (current) use of aspirin: Secondary | ICD-10-CM

## 2019-04-13 DIAGNOSIS — M109 Gout, unspecified: Secondary | ICD-10-CM | POA: Diagnosis not present

## 2019-04-13 DIAGNOSIS — Z1159 Encounter for screening for other viral diseases: Secondary | ICD-10-CM | POA: Diagnosis not present

## 2019-04-13 DIAGNOSIS — T84020A Dislocation of internal right hip prosthesis, initial encounter: Secondary | ICD-10-CM | POA: Diagnosis not present

## 2019-04-13 DIAGNOSIS — I10 Essential (primary) hypertension: Secondary | ICD-10-CM | POA: Diagnosis not present

## 2019-04-13 DIAGNOSIS — Z720 Tobacco use: Secondary | ICD-10-CM

## 2019-04-13 DIAGNOSIS — E1140 Type 2 diabetes mellitus with diabetic neuropathy, unspecified: Secondary | ICD-10-CM | POA: Diagnosis not present

## 2019-04-13 DIAGNOSIS — S73004A Unspecified dislocation of right hip, initial encounter: Secondary | ICD-10-CM | POA: Diagnosis not present

## 2019-04-13 DIAGNOSIS — Z96641 Presence of right artificial hip joint: Secondary | ICD-10-CM | POA: Diagnosis not present

## 2019-04-13 DIAGNOSIS — E785 Hyperlipidemia, unspecified: Secondary | ICD-10-CM | POA: Diagnosis present

## 2019-04-13 DIAGNOSIS — Z91013 Allergy to seafood: Secondary | ICD-10-CM

## 2019-04-13 DIAGNOSIS — I96 Gangrene, not elsewhere classified: Secondary | ICD-10-CM | POA: Diagnosis not present

## 2019-04-13 DIAGNOSIS — Z955 Presence of coronary angioplasty implant and graft: Secondary | ICD-10-CM

## 2019-04-13 DIAGNOSIS — Z8349 Family history of other endocrine, nutritional and metabolic diseases: Secondary | ICD-10-CM

## 2019-04-13 DIAGNOSIS — Z833 Family history of diabetes mellitus: Secondary | ICD-10-CM

## 2019-04-13 DIAGNOSIS — Z79899 Other long term (current) drug therapy: Secondary | ICD-10-CM | POA: Diagnosis not present

## 2019-04-13 DIAGNOSIS — D62 Acute posthemorrhagic anemia: Secondary | ICD-10-CM | POA: Diagnosis not present

## 2019-04-13 DIAGNOSIS — Z09 Encounter for follow-up examination after completed treatment for conditions other than malignant neoplasm: Secondary | ICD-10-CM

## 2019-04-13 DIAGNOSIS — Z471 Aftercare following joint replacement surgery: Secondary | ICD-10-CM | POA: Diagnosis not present

## 2019-04-13 DIAGNOSIS — G8929 Other chronic pain: Secondary | ICD-10-CM | POA: Diagnosis present

## 2019-04-13 DIAGNOSIS — I252 Old myocardial infarction: Secondary | ICD-10-CM

## 2019-04-13 DIAGNOSIS — L905 Scar conditions and fibrosis of skin: Secondary | ICD-10-CM | POA: Diagnosis not present

## 2019-04-13 DIAGNOSIS — Z794 Long term (current) use of insulin: Secondary | ICD-10-CM

## 2019-04-13 DIAGNOSIS — M545 Low back pain: Secondary | ICD-10-CM | POA: Diagnosis present

## 2019-04-13 DIAGNOSIS — M1990 Unspecified osteoarthritis, unspecified site: Secondary | ICD-10-CM | POA: Diagnosis not present

## 2019-04-13 DIAGNOSIS — Z87442 Personal history of urinary calculi: Secondary | ICD-10-CM

## 2019-04-13 DIAGNOSIS — I2510 Atherosclerotic heart disease of native coronary artery without angina pectoris: Secondary | ICD-10-CM | POA: Diagnosis not present

## 2019-04-13 DIAGNOSIS — K5790 Diverticulosis of intestine, part unspecified, without perforation or abscess without bleeding: Secondary | ICD-10-CM | POA: Diagnosis present

## 2019-04-13 DIAGNOSIS — Z7902 Long term (current) use of antithrombotics/antiplatelets: Secondary | ICD-10-CM | POA: Diagnosis not present

## 2019-04-13 DIAGNOSIS — M24451 Recurrent dislocation, right hip: Secondary | ICD-10-CM | POA: Diagnosis present

## 2019-04-13 DIAGNOSIS — Z419 Encounter for procedure for purposes other than remedying health state, unspecified: Secondary | ICD-10-CM

## 2019-04-13 DIAGNOSIS — Z8249 Family history of ischemic heart disease and other diseases of the circulatory system: Secondary | ICD-10-CM

## 2019-04-13 DIAGNOSIS — Z9049 Acquired absence of other specified parts of digestive tract: Secondary | ICD-10-CM

## 2019-04-13 DIAGNOSIS — Z885 Allergy status to narcotic agent status: Secondary | ICD-10-CM

## 2019-04-13 DIAGNOSIS — Z888 Allergy status to other drugs, medicaments and biological substances status: Secondary | ICD-10-CM

## 2019-04-13 HISTORY — DX: Unspecified dislocation of right hip, initial encounter: S73.004A

## 2019-04-13 HISTORY — PX: TOTAL HIP REVISION: SHX763

## 2019-04-13 LAB — GLUCOSE, CAPILLARY
Glucose-Capillary: 177 mg/dL — ABNORMAL HIGH (ref 70–99)
Glucose-Capillary: 52 mg/dL — ABNORMAL LOW (ref 70–99)
Glucose-Capillary: 132 mg/dL — ABNORMAL HIGH (ref 70–99)
Glucose-Capillary: 86 mg/dL (ref 70–99)
Glucose-Capillary: 151 mg/dL — ABNORMAL HIGH (ref 70–99)

## 2019-04-13 SURGERY — TOTAL HIP REVISION
Anesthesia: General | Site: Hip | Laterality: Right

## 2019-04-13 MED ORDER — SUGAMMADEX SODIUM 200 MG/2ML IV SOLN
INTRAVENOUS | Status: DC | PRN
  Administered 2019-04-13: 200 mg via INTRAVENOUS

## 2019-04-13 MED ORDER — INSULIN DETEMIR 100 UNIT/ML ~~LOC~~ SOLN: 30.0000 [IU] | Freq: Every evening | SUBCUTANEOUS | Status: DC | PRN

## 2019-04-13 MED ORDER — DEXTROSE 50 % IV SOLN
INTRAVENOUS | Status: AC
  Administered 2019-04-13: 50 mL
  Filled 2019-04-13: qty 50

## 2019-04-13 MED ORDER — EPHEDRINE SULFATE 50 MG/ML IJ SOLN
INTRAMUSCULAR | Status: DC | PRN
  Administered 2019-04-13 (×2): 5 mg via INTRAVENOUS
  Administered 2019-04-13: 10 mg via INTRAVENOUS
  Administered 2019-04-13: 5 mg via INTRAVENOUS
  Administered 2019-04-13: 25 mg via INTRAVENOUS

## 2019-04-13 MED ORDER — ALBUMIN HUMAN 5 % IV SOLN
INTRAVENOUS | Status: DC | PRN
  Administered 2019-04-13: 10:00:00 via INTRAVENOUS

## 2019-04-13 MED ORDER — GLIMEPIRIDE 4 MG PO TABS
4.0000 mg | ORAL_TABLET | Freq: Two times a day (BID) | ORAL | Status: DC
  Administered 2019-04-13 – 2019-04-15 (×5): 4 mg via ORAL
  Filled 2019-04-13 (×6): qty 1

## 2019-04-13 MED ORDER — TERAZOSIN HCL 5 MG PO CAPS
5.0000 mg | ORAL_CAPSULE | Freq: Every day | ORAL | Status: DC
  Administered 2019-04-13 – 2019-04-14 (×2): 5 mg via ORAL
  Filled 2019-04-13 (×3): qty 1

## 2019-04-13 MED ORDER — INSULIN DETEMIR 100 UNIT/ML ~~LOC~~ SOLN: 12.0000 [IU] | Freq: Every day | SUBCUTANEOUS | Status: DC

## 2019-04-13 MED ORDER — FENTANYL CITRATE (PF) 250 MCG/5ML IJ SOLN
INTRAMUSCULAR | Status: AC
  Filled 2019-04-13: qty 5

## 2019-04-13 MED ORDER — METOCLOPRAMIDE HCL 5 MG PO TABS: 5.0000 mg | ORAL_TABLET | Freq: Three times a day (TID) | ORAL | Status: DC | PRN

## 2019-04-13 MED ORDER — CEFAZOLIN SODIUM-DEXTROSE 1-4 GM/50ML-% IV SOLN
1.0000 g | Freq: Three times a day (TID) | INTRAVENOUS | Status: AC
  Administered 2019-04-13 (×2): 1 g via INTRAVENOUS
  Filled 2019-04-13 (×2): qty 50

## 2019-04-13 MED ORDER — MORPHINE SULFATE (PF) 2 MG/ML IV SOLN
1.0000 mg | INTRAVENOUS | Status: DC | PRN
  Administered 2019-04-15: 1 mg via INTRAVENOUS
  Filled 2019-04-13: qty 1

## 2019-04-13 MED ORDER — DEXTROSE 50 % IV SOLN
25.0000 g | INTRAVENOUS | Status: AC
  Filled 2019-04-13: qty 50

## 2019-04-13 MED ORDER — INSULIN DETEMIR 100 UNIT/ML ~~LOC~~ SOLN: 15.0000 [IU] | Freq: Every evening | SUBCUTANEOUS | Status: DC | PRN

## 2019-04-13 MED ORDER — BUPIVACAINE HCL (PF) 0.25 % IJ SOLN
INTRAMUSCULAR | Status: DC | PRN
  Administered 2019-04-13: 10 mL

## 2019-04-13 MED ORDER — METFORMIN HCL 500 MG PO TABS
1000.0000 mg | ORAL_TABLET | Freq: Two times a day (BID) | ORAL | Status: DC
  Administered 2019-04-13 – 2019-04-15 (×4): 1000 mg via ORAL
  Filled 2019-04-13 (×4): qty 2

## 2019-04-13 MED ORDER — POLYETHYLENE GLYCOL 3350 17 G PO PACK: 17.0000 g | PACK | Freq: Every day | ORAL | Status: DC | PRN

## 2019-04-13 MED ORDER — PHENYLEPHRINE 40 MCG/ML (10ML) SYRINGE FOR IV PUSH (FOR BLOOD PRESSURE SUPPORT)
PREFILLED_SYRINGE | INTRAVENOUS | Status: DC | PRN
  Administered 2019-04-13: 40 ug via INTRAVENOUS
  Administered 2019-04-13: 80 ug via INTRAVENOUS
  Administered 2019-04-13: 40 ug via INTRAVENOUS
  Administered 2019-04-13: 80 ug via INTRAVENOUS
  Administered 2019-04-13 (×2): 40 ug via INTRAVENOUS

## 2019-04-13 MED ORDER — ROCURONIUM BROMIDE 10 MG/ML (PF) SYRINGE
PREFILLED_SYRINGE | INTRAVENOUS | Status: AC
  Filled 2019-04-13: qty 10

## 2019-04-13 MED ORDER — HYDROCHLOROTHIAZIDE 12.5 MG PO CAPS
12.5000 mg | ORAL_CAPSULE | Freq: Every day | ORAL | Status: DC
  Administered 2019-04-13 – 2019-04-14 (×2): 12.5 mg via ORAL
  Filled 2019-04-13 (×2): qty 1

## 2019-04-13 MED ORDER — METOCLOPRAMIDE HCL 5 MG/ML IJ SOLN: 5.0000 mg | Freq: Three times a day (TID) | INTRAMUSCULAR | Status: DC | PRN

## 2019-04-13 MED ORDER — EPHEDRINE 5 MG/ML INJ
INTRAVENOUS | Status: AC
  Filled 2019-04-13: qty 10

## 2019-04-13 MED ORDER — AMLODIPINE BESYLATE 2.5 MG PO TABS
2.5000 mg | ORAL_TABLET | Freq: Every day | ORAL | Status: DC
  Administered 2019-04-14: 2.5 mg via ORAL
  Filled 2019-04-13: qty 1

## 2019-04-13 MED ORDER — ONDANSETRON HCL 4 MG/2ML IJ SOLN: 4.0000 mg | Freq: Four times a day (QID) | INTRAMUSCULAR | Status: DC | PRN

## 2019-04-13 MED ORDER — METHOCARBAMOL 1000 MG/10ML IJ SOLN
500.0000 mg | Freq: Four times a day (QID) | INTRAVENOUS | Status: DC | PRN
  Filled 2019-04-13 (×2): qty 5

## 2019-04-13 MED ORDER — CEFAZOLIN SODIUM-DEXTROSE 2-4 GM/100ML-% IV SOLN
INTRAVENOUS | Status: AC
  Filled 2019-04-13: qty 100

## 2019-04-13 MED ORDER — HYDROCODONE-ACETAMINOPHEN 5-325 MG PO TABS: 1.0000 | ORAL_TABLET | ORAL | 0 refills | Status: DC | PRN

## 2019-04-13 MED ORDER — ALLOPURINOL 300 MG PO TABS
300.0000 mg | ORAL_TABLET | Freq: Every day | ORAL | Status: DC
  Administered 2019-04-14 – 2019-04-15 (×2): 300 mg via ORAL
  Filled 2019-04-13 (×2): qty 1

## 2019-04-13 MED ORDER — POTASSIUM CHLORIDE CRYS ER 20 MEQ PO TBCR
20.0000 meq | EXTENDED_RELEASE_TABLET | Freq: Every day | ORAL | Status: DC
  Administered 2019-04-13 – 2019-04-15 (×3): 20 meq via ORAL
  Filled 2019-04-13 (×3): qty 1

## 2019-04-13 MED ORDER — MEPERIDINE HCL 25 MG/ML IJ SOLN: 6.2500 mg | INTRAMUSCULAR | Status: DC | PRN

## 2019-04-13 MED ORDER — PHENYLEPHRINE 40 MCG/ML (10ML) SYRINGE FOR IV PUSH (FOR BLOOD PRESSURE SUPPORT)
PREFILLED_SYRINGE | INTRAVENOUS | Status: AC
  Filled 2019-04-13: qty 10

## 2019-04-13 MED ORDER — GABAPENTIN 300 MG PO CAPS
600.0000 mg | ORAL_CAPSULE | Freq: Two times a day (BID) | ORAL | Status: DC
  Administered 2019-04-13 – 2019-04-15 (×4): 600 mg via ORAL
  Filled 2019-04-13 (×4): qty 2

## 2019-04-13 MED ORDER — OXYCODONE HCL 5 MG PO TABS
5.0000 mg | ORAL_TABLET | ORAL | Status: DC | PRN
  Administered 2019-04-13 – 2019-04-15 (×6): 5 mg via ORAL
  Filled 2019-04-13 (×6): qty 1

## 2019-04-13 MED ORDER — ONDANSETRON HCL 4 MG/2ML IJ SOLN
INTRAMUSCULAR | Status: DC | PRN
  Administered 2019-04-13: 4 mg via INTRAVENOUS

## 2019-04-13 MED ORDER — SODIUM CHLORIDE 0.9 % IV SOLN
INTRAVENOUS | Status: DC | PRN
  Administered 2019-04-13: 80 ug/min via INTRAVENOUS
  Administered 2019-04-13: 120 ug/min via INTRAVENOUS

## 2019-04-13 MED ORDER — ASPIRIN EC 325 MG PO TBEC
325.0000 mg | DELAYED_RELEASE_TABLET | Freq: Every day | ORAL | Status: DC
  Administered 2019-04-14 – 2019-04-15 (×2): 325 mg via ORAL
  Filled 2019-04-13 (×2): qty 1

## 2019-04-13 MED ORDER — BUPIVACAINE HCL (PF) 0.25 % IJ SOLN
INTRAMUSCULAR | Status: AC
  Filled 2019-04-13: qty 30

## 2019-04-13 MED ORDER — ROCURONIUM BROMIDE 50 MG/5ML IV SOSY
PREFILLED_SYRINGE | INTRAVENOUS | Status: DC | PRN
  Administered 2019-04-13: 50 mg via INTRAVENOUS

## 2019-04-13 MED ORDER — PHENOL 1.4 % MT LIQD: 1.0000 | OROMUCOSAL | Status: DC | PRN

## 2019-04-13 MED ORDER — DEXAMETHASONE SODIUM PHOSPHATE 10 MG/ML IJ SOLN
INTRAMUSCULAR | Status: AC
  Filled 2019-04-13: qty 1

## 2019-04-13 MED ORDER — METHOCARBAMOL 500 MG PO TABS
500.0000 mg | ORAL_TABLET | Freq: Four times a day (QID) | ORAL | Status: DC | PRN
  Administered 2019-04-13 – 2019-04-15 (×6): 500 mg via ORAL
  Filled 2019-04-13 (×6): qty 1

## 2019-04-13 MED ORDER — SODIUM CHLORIDE 0.9 % IR SOLN
Status: DC | PRN
  Administered 2019-04-13: 3000 mL
  Administered 2019-04-13: 1000 mL

## 2019-04-13 MED ORDER — NITROGLYCERIN 0.4 MG SL SUBL: 0.4000 mg | SUBLINGUAL_TABLET | SUBLINGUAL | Status: DC | PRN

## 2019-04-13 MED ORDER — CHLORHEXIDINE GLUCONATE 4 % EX LIQD: 60.0000 mL | Freq: Once | CUTANEOUS | Status: DC

## 2019-04-13 MED ORDER — MENTHOL 3 MG MT LOZG: 1.0000 | LOZENGE | OROMUCOSAL | Status: DC | PRN

## 2019-04-13 MED ORDER — DOCUSATE SODIUM 100 MG PO CAPS
100.0000 mg | ORAL_CAPSULE | Freq: Two times a day (BID) | ORAL | Status: DC
  Administered 2019-04-13 – 2019-04-15 (×5): 100 mg via ORAL
  Filled 2019-04-13 (×5): qty 1

## 2019-04-13 MED ORDER — ONDANSETRON HCL 4 MG PO TABS: 4.0000 mg | ORAL_TABLET | Freq: Four times a day (QID) | ORAL | Status: DC | PRN

## 2019-04-13 MED ORDER — CEFAZOLIN SODIUM-DEXTROSE 2-4 GM/100ML-% IV SOLN
2.0000 g | INTRAVENOUS | Status: AC
  Administered 2019-04-13: 2 g via INTRAVENOUS

## 2019-04-13 MED ORDER — ISOSORBIDE MONONITRATE ER 30 MG PO TB24
30.0000 mg | ORAL_TABLET | Freq: Every day | ORAL | Status: DC
  Administered 2019-04-14 – 2019-04-15 (×2): 30 mg via ORAL
  Filled 2019-04-13 (×2): qty 1

## 2019-04-13 MED ORDER — MIDAZOLAM HCL 2 MG/2ML IJ SOLN
INTRAMUSCULAR | Status: AC
  Filled 2019-04-13: qty 2

## 2019-04-13 MED ORDER — VITAMIN D 25 MCG (1000 UNIT) PO TABS
2000.0000 [IU] | ORAL_TABLET | Freq: Every day | ORAL | Status: DC
  Administered 2019-04-13 – 2019-04-15 (×3): 2000 [IU] via ORAL
  Filled 2019-04-13 (×3): qty 2

## 2019-04-13 MED ORDER — LOSARTAN POTASSIUM 50 MG PO TABS
50.0000 mg | ORAL_TABLET | Freq: Every day | ORAL | Status: DC
  Administered 2019-04-13 – 2019-04-15 (×3): 50 mg via ORAL
  Filled 2019-04-13 (×3): qty 1

## 2019-04-13 MED ORDER — PROMETHAZINE HCL 25 MG/ML IJ SOLN: 6.2500 mg | INTRAMUSCULAR | Status: DC | PRN

## 2019-04-13 MED ORDER — FENTANYL CITRATE (PF) 100 MCG/2ML IJ SOLN
INTRAMUSCULAR | Status: DC | PRN
  Administered 2019-04-13 (×5): 50 ug via INTRAVENOUS

## 2019-04-13 MED ORDER — FENTANYL CITRATE (PF) 100 MCG/2ML IJ SOLN
25.0000 ug | INTRAMUSCULAR | Status: DC | PRN
  Administered 2019-04-13: 50 ug via INTRAVENOUS

## 2019-04-13 MED ORDER — PROPOFOL 10 MG/ML IV BOLUS
INTRAVENOUS | Status: DC | PRN
  Administered 2019-04-13: 120 mg via INTRAVENOUS
  Administered 2019-04-13: 30 mg via INTRAVENOUS

## 2019-04-13 MED ORDER — ONDANSETRON HCL 4 MG/2ML IJ SOLN
INTRAMUSCULAR | Status: AC
  Filled 2019-04-13: qty 2

## 2019-04-13 MED ORDER — LACTATED RINGERS IV SOLN
INTRAVENOUS | Status: DC
  Administered 2019-04-13 (×2): via INTRAVENOUS

## 2019-04-13 MED ORDER — ACETAMINOPHEN 325 MG PO TABS
325.0000 mg | ORAL_TABLET | Freq: Four times a day (QID) | ORAL | Status: DC | PRN
  Administered 2019-04-13 – 2019-04-15 (×3): 650 mg via ORAL
  Filled 2019-04-13 (×3): qty 2

## 2019-04-13 MED ORDER — LIDOCAINE 2% (20 MG/ML) 5 ML SYRINGE
INTRAMUSCULAR | Status: DC | PRN
  Administered 2019-04-13: 100 mg via INTRAVENOUS

## 2019-04-13 MED ORDER — FENTANYL CITRATE (PF) 100 MCG/2ML IJ SOLN
INTRAMUSCULAR | Status: AC
  Filled 2019-04-13: qty 2

## 2019-04-13 MED ORDER — SUGAMMADEX SODIUM 500 MG/5ML IV SOLN
INTRAVENOUS | Status: AC
  Filled 2019-04-13: qty 5

## 2019-04-13 MED ORDER — LIDOCAINE 2% (20 MG/ML) 5 ML SYRINGE
INTRAMUSCULAR | Status: AC
  Filled 2019-04-13: qty 5

## 2019-04-13 MED ORDER — CLOPIDOGREL BISULFATE 75 MG PO TABS
75.0000 mg | ORAL_TABLET | Freq: Every day | ORAL | Status: DC
  Administered 2019-04-13 – 2019-04-15 (×3): 75 mg via ORAL
  Filled 2019-04-13 (×3): qty 1

## 2019-04-13 MED ORDER — PROPOFOL 10 MG/ML IV BOLUS
INTRAVENOUS | Status: AC
  Filled 2019-04-13: qty 40

## 2019-04-13 MED ORDER — SODIUM CHLORIDE 0.9 % IV SOLN
INTRAVENOUS | Status: DC
  Administered 2019-04-13 – 2019-04-14 (×2): via INTRAVENOUS

## 2019-04-13 SURGICAL SUPPLY — 77 items
APL SKNCLS STERI-STRIP NONHPOA (GAUZE/BANDAGES/DRESSINGS) ×1
BENZOIN TINCTURE PRP APPL 2/3 (GAUZE/BANDAGES/DRESSINGS) ×2 IMPLANT
BLADE CLIPPER SURG (BLADE) IMPLANT
BLADE SURG 10 STRL SS (BLADE) ×3 IMPLANT
BRUSH FEMORAL CANAL (MISCELLANEOUS) IMPLANT
CONT SPEC 4OZ CLIKSEAL STRL BL (MISCELLANEOUS) ×1 IMPLANT
COVER BACK TABLE 24X17X13 BIG (DRAPES) ×2 IMPLANT
COVER SURGICAL LIGHT HANDLE (MISCELLANEOUS) ×2 IMPLANT
COVER WAND RF STERILE (DRAPES) ×2 IMPLANT
DRAPE C-ARM 42X72 X-RAY (DRAPES) IMPLANT
DRAPE HALF SHEET 40X57 (DRAPES) ×1 IMPLANT
DRAPE IMP U-DRAPE 54X76 (DRAPES) ×2 IMPLANT
DRAPE INCISE IOBAN 66X45 STRL (DRAPES) ×1 IMPLANT
DRAPE ORTHO SPLIT 77X108 STRL (DRAPES) ×4
DRAPE SURG ORHT 6 SPLT 77X108 (DRAPES) ×2 IMPLANT
DRAPE U-SHAPE 47X51 STRL (DRAPES) ×2 IMPLANT
DRSG MEPILEX BORDER 4X12 (GAUZE/BANDAGES/DRESSINGS) ×1 IMPLANT
DRSG MEPITEL 4X7.2 (GAUZE/BANDAGES/DRESSINGS) ×1 IMPLANT
DRSG PAD ABDOMINAL 8X10 ST (GAUZE/BANDAGES/DRESSINGS) ×4 IMPLANT
DURAPREP 26ML APPLICATOR (WOUND CARE) ×2 IMPLANT
ELECT CAUTERY BLADE 6.4 (BLADE) ×2 IMPLANT
ELECT REM PT RETURN 9FT ADLT (ELECTROSURGICAL) ×2
ELECTRODE REM PT RTRN 9FT ADLT (ELECTROSURGICAL) ×1 IMPLANT
EVACUATOR 1/8 PVC DRAIN (DRAIN) IMPLANT
FACESHIELD WRAPAROUND (MASK) ×4 IMPLANT
FACESHIELD WRAPAROUND OR TEAM (MASK) ×2 IMPLANT
GAUZE SPONGE 4X4 12PLY STRL (GAUZE/BANDAGES/DRESSINGS) ×2 IMPLANT
GAUZE XEROFORM 5X9 LF (GAUZE/BANDAGES/DRESSINGS) ×2 IMPLANT
GLOVE BIOGEL PI IND STRL 8 (GLOVE) ×2 IMPLANT
GLOVE BIOGEL PI INDICATOR 8 (GLOVE) ×2
GLOVE ORTHO TXT STRL SZ7.5 (GLOVE) ×4 IMPLANT
GOWN STRL REUS W/ TWL LRG LVL3 (GOWN DISPOSABLE) ×1 IMPLANT
GOWN STRL REUS W/ TWL XL LVL3 (GOWN DISPOSABLE) ×1 IMPLANT
GOWN STRL REUS W/TWL 2XL LVL3 (GOWN DISPOSABLE) ×2 IMPLANT
GOWN STRL REUS W/TWL LRG LVL3 (GOWN DISPOSABLE) ×4
GOWN STRL REUS W/TWL XL LVL3 (GOWN DISPOSABLE) ×2
HANDPIECE INTERPULSE COAX TIP (DISPOSABLE)
HEAD OSTEO CTAPER L FIT (Orthopedic Implant) ×2 IMPLANT
HEAD OSTEO CTAPER L FIT22 +10 (Orthopedic Implant) IMPLANT
IMMOBILIZER KNEE 20 (SOFTGOODS) IMPLANT
IMMOBILIZER KNEE 22 UNIV (SOFTGOODS) ×1 IMPLANT
IMMOBILIZER KNEE 24 THIGH 36 (MISCELLANEOUS) IMPLANT
IMMOBILIZER KNEE 24 UNIV (MISCELLANEOUS)
INSERT TRIDENT CONSTR 22 (Orthopedic Implant) ×1 IMPLANT
KIT BASIN OR (CUSTOM PROCEDURE TRAY) ×2 IMPLANT
KIT TURNOVER KIT B (KITS) ×2 IMPLANT
MANIFOLD NEPTUNE II (INSTRUMENTS) ×2 IMPLANT
NDL 1/2 CIR MAYO (NEEDLE) ×1 IMPLANT
NDL HYPO 25GX1X1/2 BEV (NEEDLE) ×1 IMPLANT
NEEDLE 1/2 CIR MAYO (NEEDLE) ×2 IMPLANT
NEEDLE HYPO 25GX1X1/2 BEV (NEEDLE) ×2 IMPLANT
NS IRRIG 1000ML POUR BTL (IV SOLUTION) ×2 IMPLANT
PACK TOTAL JOINT (CUSTOM PROCEDURE TRAY) ×2 IMPLANT
PACK UNIVERSAL I (CUSTOM PROCEDURE TRAY) ×2 IMPLANT
PAD ARMBOARD 7.5X6 YLW CONV (MISCELLANEOUS) ×3 IMPLANT
SET HNDPC FAN SPRY TIP SCT (DISPOSABLE) IMPLANT
SPONGE LAP 4X18 RFD (DISPOSABLE) ×4 IMPLANT
STAPLER VISISTAT 35W (STAPLE) ×2 IMPLANT
SUCTION FRAZIER HANDLE 10FR (MISCELLANEOUS) ×1
SUCTION TUBE FRAZIER 10FR DISP (MISCELLANEOUS) ×1 IMPLANT
SUT ETHIBOND NAB CT1 #1 30IN (SUTURE) ×6 IMPLANT
SUT TICRON (SUTURE) ×4 IMPLANT
SUT VIC AB 0 CT1 27 (SUTURE) ×2
SUT VIC AB 0 CT1 27XBRD ANBCTR (SUTURE) IMPLANT
SUT VIC AB 1 CTX 27 (SUTURE) ×2 IMPLANT
SUT VIC AB 2-0 CT1 27 (SUTURE) ×8
SUT VIC AB 2-0 CT1 TAPERPNT 27 (SUTURE) ×3 IMPLANT
SUT VICRYL 0 TIES 12 18 (SUTURE) ×2 IMPLANT
SUT VLOC 180 0 24IN GS25 (SUTURE) ×1 IMPLANT
SWAB COLLECTION DEVICE MRSA (MISCELLANEOUS) ×1 IMPLANT
SWAB CULTURE ESWAB REG 1ML (MISCELLANEOUS) ×1 IMPLANT
SYR CONTROL 10ML LL (SYRINGE) ×2 IMPLANT
TOWEL GREEN STERILE (TOWEL DISPOSABLE) ×2 IMPLANT
TOWEL GREEN STERILE FF (TOWEL DISPOSABLE) ×2 IMPLANT
TOWER CARTRIDGE SMART MIX (DISPOSABLE) IMPLANT
TRAY FOLEY MTR SLVR 16FR STAT (SET/KITS/TRAYS/PACK) ×1 IMPLANT
WATER STERILE IRR 1000ML POUR (IV SOLUTION) ×8 IMPLANT

## 2019-04-13 NOTE — Transfer of Care (Signed)
Immediate Anesthesia Transfer of Care Note  Patient: Keith Espinoza  Procedure(s) Performed: RIGHT TOTAL HIP REVISION-POSTERIOR  APPROACH LATERAL (Right Hip)  Patient Location: PACU  Anesthesia Type:General  Level of Consciousness: awake, alert , oriented and sedated  Airway & Oxygen Therapy: Patient Spontanous Breathing and Patient connected to nasal cannula oxygen  Post-op Assessment: Report given to RN, Post -op Vital signs reviewed and stable and Patient moving all extremities  Post vital signs: Reviewed and stable  Last Vitals:  Vitals Value Taken Time  BP 128/71 04/13/19 1017  Temp    Pulse 88 04/13/19 1020  Resp 16 04/13/19 1020  SpO2 100 % 04/13/19 1020  Vitals shown include unvalidated device data.  Last Pain:  Vitals:   04/13/19 0617  TempSrc:   PainSc: 2          Complications: No apparent anesthesia complications

## 2019-04-13 NOTE — H&P (Signed)
Office Visit Note              Patient: Keith Espinoza                                            Date of Birth: Aug 07, 1941                                                    MRN: [id number] Visit Date: 03/27/2019                                                                     Requested by: [name], MD [address] [place],  [place] [address] PCP: [name], MD   Assessment & Plan: Visit Diagnoses:  1. Recurrent dislocation of right hip   2. Other intervertebral disc degeneration, lumbar region     Plan: We discussed possibility of repeating his MRI scan lumbar spine after his hip revision surgery.  His hip is popped out several times in the last year and needs to be corrected before he has another dislocation.  He may get some relief of his back pain once his hip is stable and he is ambulating better.  Risks of surgery for total hip arthroplasty revision with placement of restraining liner was discussed in detail.  Posterior approach discussed.  Cardiology note is reviewed in epic today.  Patient will be scheduled for routine preoperative labs.  Patient understands and requests we proceed.  Follow-Up Instructions: Return for one week post 7/10 hip surgery .   Orders:  No orders of the defined types were placed in this encounter.      Meds ordered this encounter  Medications  . traMADol (ULTRAM) 50 MG tablet    Sig: Take 1 tablet (50 mg total) by mouth every 12 (twelve) hours as needed.    Dispense:  30 tablet    Refill:  0      Procedures: No procedures performed   Clinical Data: No additional findings.   Subjective:    Chief Complaint  Patient presents with  . Lower Back - Pain    HPI 78 year old male returns.  He is scheduled for right hip revision for repeat hip dislocations with conversion to a restrained polyethylene liner.  Original total hip arthroplasty done by Dr. Rudene Christians in 2003.  Since that time patient's had  late hip dislocations in July 2019, September 2019 reduced in the ER.  Third dislocation occurred Feb 15, 2019 when he was on a bulldozer leaning forward to remove a branch off the hood of the bulldozer and dislocated his hip again.  This was again reduced in emergency room by Dr. Alphonzo Severance.  He been treated with a knee immobilizer for 6 weeks but still has recurrent dislocations after 17 years of polyethylene wear.  Patient does not have any loosening of the stem or acetabular cup.  Patient's had no fever no chills normal white blood count.  He has had  ongoing problems with back pain that got worse 2 weeks ago previous epidural by Dr. Ernestina Patches was on 12/18/2018.  Patient got relief he is used tramadol and Tylenol.  He states he is had some pain in his left leg sometimes it feels like it goes numb radiates into the anterior thigh and he has been amatory with a cane being extremely careful to avoid dislocating his hip before his revision surgery on 04/13/2019.  Previous lumbar MRI 2016 showed prominent left S1 disc protrusion on the left.  Postsurgical changes at L4-5.  Previous left total knee arthroplasty doing well.  He has had cardiac stents in the past 2013 and 2012.  Review of Systems 14 point review of system positive type 2 diabetes on insulin.  Prostate hypertrophy.  History of hyperlipidemia.  Previous left total knee arthroplasty.  Recurrent right hip dislocations as listed above.  No current angina no shortness of breath.  History of chronic low back pain previous lumbar decompression surgery L4-5.   Objective: Vital Signs: Ht 5\' 10"  (1.778 m)   Wt 199 lb (90.3 kg)   BMI 28.55 kg/m   Physical Exam Constitutional:      Appearance: He is well-developed.  HENT:     Head: Normocephalic and atraumatic.  Eyes:     Pupils: Pupils are equal, round, and reactive to light.  Neck:     Thyroid: No thyromegaly.     Trachea: No tracheal deviation.  Cardiovascular:     Rate and Rhythm: Normal  rate.  Pulmonary:     Effort: Pulmonary effort is normal.     Breath sounds: No wheezing.  Abdominal:     General: Bowel sounds are normal.     Palpations: Abdomen is soft.  Skin:    General: Skin is warm and dry.     Capillary Refill: Capillary refill takes less than 2 seconds.  Neurological:     Mental Status: He is alert and oriented to person, place, and time.  Psychiatric:        Behavior: Behavior normal.        Thought Content: Thought content normal.        Judgment: Judgment normal.     Ortho Exam patient has well-healed right total hip arthroplasty incision well-healed total knee arthroplasty incision in his knee.  Distal pulses are intact.  He is Dealer with a cane.  Anterior tib EHL is intact.  Negative calf tenderness.  Good quad strength right and left.  Specialty Comments:  No specialty comments available.  Imaging: No results found.   PMFS History:     Patient Active Problem List   Diagnosis Date Noted  . Other intervertebral disc degeneration, lumbar region 03/30/2019  . CAD (coronary artery disease) 01/30/2019  . Tobacco abuse 01/30/2019  . 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  . Burn, foot, second degree, left, initial encounter 06/08/2017  . 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 S/P total knee arthroplasty, left 10/26/2016  . Hyperlipidemia 09/04/2014  . Thrombocytopenia ([place])   . Precordial chest pain 04/05/2014  . Coronary atherosclerosis of native coronary artery 10/01/2013  . Other and unspecified hyperlipidemia 10/01/2013  . Essential hypertension, benign 10/01/2013  . Insulin dependent type 2 diabetes mellitus ([place]) 10/01/2013  . Esophageal reflux 10/01/2013  . Hypertrophy of prostate without urinary obstruction and other lower urinary tract symptoms (LUTS) 10/01/2013       Past Medical History:  Diagnosis Date  . Allergic rhinitis   . Allergic rhinitis   .  Arthritis   . Chronic leg pain     right  . Chronic lower back pain   . Coronary artery disease    a. Stenting to RCA 2004; staged DES to LAD and Cx 2004. DES to mRCA 2012. b. DES to mCx, PTCA to dCx 11/2011. c. Lateral wall MI 2013 s/p PTCA to distal Cx & DES to mid OM2 11/2011. d. Low risk nuc 04/2014, EF wnl.  . Diabetes mellitus    Insulin dependent  . Diabetic neuropathy (HCC)    MILD  . Diverticulosis   . Dysrhythmia   . Gilbert syndrome   . Gout    right wrist; right foot; right elbow; have had it since 1970's  . H/O hiatal hernia   . Heart murmur   . History of echocardiogram    aortic sclerosis per echo 12/09 EF 65%, otherwise normal  . History of hemorrhoids    BLEEDING  . Hypertension    Diagnosed 1995   . Pancreatic pseudocyst    a. s/p remote drainage 2006.  Marland Kitchen Thrombocytopenia ([place])    Seen on oldest labs in system from 2004  . Vitamin B 12 deficiency    orally replaced         Family History  Problem Relation Age of Onset  . Diabetes Mother   . Hyperlipidemia Mother   . Hypertension Mother   . Cancer Father   . Hypertension Father   . Diabetes Sister   . Hypertension Sister   . Cancer Brother   . Heart attack Neg Hx          Past Surgical History:  Procedure Laterality Date  . BACK SURGERY     "total of 3 times" S/P fall   . CARPAL TUNNEL RELEASE Bilateral   . CHOLECYSTECTOMY  1990's  . COLONOSCOPY    . CORONARY ANGIOPLASTY  11/11/11  . CORONARY ANGIOPLASTY WITH STENT PLACEMENT  09/30/2011   "1 then; makes a total of 4"  . CORONARY ANGIOPLASTY WITH STENT PLACEMENT  11/11/11   "1; makes a total of 5"  . INGUINAL HERNIA REPAIR  2003   right  . JOINT REPLACEMENT Right 04/03/2002   hip replacment  . KNEE ARTHROSCOPY  1990's   left  . LEFT HEART CATHETERIZATION WITH CORONARY ANGIOGRAM N/A 09/30/2011   Procedure: LEFT HEART CATHETERIZATION WITH CORONARY ANGIOGRAM;  Surgeon: Jettie Booze, MD;  Location[facility] CATH LAB;  Service:  Cardiovascular;  Laterality: N/A;  possible PCI  . LEFT HEART CATHETERIZATION WITH CORONARY ANGIOGRAM N/A 11/15/2011   Procedure: LEFT HEART CATHETERIZATION WITH CORONARY ANGIOGRAM;  Surgeon: Jettie Booze, MD;  Location: [facility] CATH LAB;  Service: Cardiovascular;  Laterality: N/A;  . PERCUTANEOUS CORONARY STENT INTERVENTION (PCI-S)  09/30/2011   Procedure: PERCUTANEOUS CORONARY STENT INTERVENTION (PCI-S);  Surgeon: Jettie Booze, MD;  Location: [facility] CATH LAB;  Service: Cardiovascular;;  . PERCUTANEOUS CORONARY STENT INTERVENTION (PCI-S) N/A 11/11/2011   Procedure: PERCUTANEOUS CORONARY STENT INTERVENTION (PCI-S);  Surgeon: Jettie Booze, MD;  Location: [facility] CATH LAB;  Service: Cardiovascular;  Laterality: N/A;  . SHOULDER SURGERY Right    X 2  . TONSILLECTOMY  ~ 1948  . TOTAL KNEE ARTHROPLASTY Left 07/23/2016   Procedure: LEFT TOTAL KNEE ARTHROPLASTY;  Surgeon: Marybelle Killings, MD;  Location: [facility];  Service: Orthopedics;  Laterality: Left;   Social History        Occupational History  . Occupation: Retired  Tobacco Use  . Smoking status: Former Research scientist (life sciences)  .  Smokeless tobacco: Current User    Types: Chew  Substance and Sexual Activity  . Alcohol use: No  . Drug use: No  . Sexual activity: Not on file              Electronically signed by Marybelle Killings, MD at 03/30/2019 1:08 PM

## 2019-04-13 NOTE — Progress Notes (Addendum)
Pt. Arrived to short stay. Blood sugar 52, non symptomatic. Began hypoglycemic protocol.  0630 cgb: 177

## 2019-04-13 NOTE — Anesthesia Procedure Notes (Signed)
Procedure Name: Intubation Date/Time: 04/13/2019 7:45 AM Performed by: Scheryl Darter, CRNA Pre-anesthesia Checklist: Patient identified, Emergency Drugs available, Suction available and Patient being monitored Patient Re-evaluated:Patient Re-evaluated prior to induction Oxygen Delivery Method: Circle System Utilized Preoxygenation: Pre-oxygenation with 100% oxygen Induction Type: IV induction Ventilation: Mask ventilation without difficulty Grade View: Grade I Tube type: Oral Tube size: 7.5 mm Number of attempts: 1 Airway Equipment and Method: Stylet and Oral airway Placement Confirmation: ETT inserted through vocal cords under direct vision,  positive ETCO2 and breath sounds checked- equal and bilateral Secured at: 23 cm Tube secured with: Tape Dental Injury: Teeth and Oropharynx as per pre-operative assessment

## 2019-04-13 NOTE — Plan of Care (Signed)
  Problem: Education: Goal: Knowledge of General Education information will improve Description Including pain rating scale, medication(s)/side effects and non-pharmacologic comfort measures Outcome: Progressing   

## 2019-04-13 NOTE — Interval H&P Note (Signed)
History and Physical Interval Note:  04/13/2019 7:22 AM  Keith Espinoza  has presented today for surgery, with the diagnosis of right total hip recurrent dislocation.  The various methods of treatment have been discussed with the patient and family. After consideration of risks, benefits and other options for treatment, the patient has consented to  Procedure(s): RIGHT TOTAL HIP REVISION-POSTERIOR  APPROACH LATERAL (Right) as a surgical intervention.  The patient's history has been reviewed, patient examined, no change in status, stable for surgery.  I have reviewed the patient's chart and labs.  Questions were answered to the patient's satisfaction.     Marybelle Killings

## 2019-04-13 NOTE — Op Note (Addendum)
Pre-and postop diagnosis: Recurrent right total hip arthroplasty dislocations.  Procedure: Revision right total up arthroplasty to locking liner. Revision of acetabulum side only of total hip arthroplasty due to recurrent hip dislocation.  Surgeon: Rodell Perna, MD  Assistant: Benjiman Core, PA-C medically necessary and present for the entire procedure  Anesthesia General.  Brief history 78 year old male had total arthroplasty in 2003 and over the 17 years had done well up until a few months ago when he has had recurrent problems with hip dislocations.  Most recent dislocation was 3 days ago.  He is admitted for revision to a locking liner.  Procedure after induction of general anesthesia patient placed in lateral position preoperative antibiotics timeout procedure was completed standard DuraPrep usual total hip sheets drapes were applied sterile skin marker in his old incision and Betadine Steri-Drape x2 ceiling the skin.  Posterior approach was made.  Gluteus maximus was split internal retractor was placed.  There was brownish scar tissue thick which had minimal vascularity.  No purulence was noted cultures were obtained but there is no tissue changes that suggest an infection.  Capsule was 2 to 3 cm thick and extensive scar tissue had to be removed.  There was a pocket where the hip was dislocated present which is posterior lateral.  Ball was popped off the trunnion after the hip was dislocated.  Extensive scar tissue was removed around the acetabulum until solid finally the poly-could be visualized it was removed with an osteotome.  Continue to work removing capsule remnants until the trunnion and the stem could be placed anteriorly and superiorly.  The Stryker locking liner was then inserted.  22 mm ball and +10 neck length which was what was removed.  Initially the liner did not lock into the cup has been taken out and then a second attempt at locked in solidly was stable.  Copious irrigation and  standard layered closure interrupted sutures 2-0 Vicryl subtenons tissue closure tensor fascia skin staple closure postop dressing and transferred to care room.

## 2019-04-13 NOTE — Discharge Instructions (Signed)
Avoid bending and squatting remember your hip precautions.  Your dressing will be waterproof when you are discharging you can take a shower and then dry off.  See Dr. Lorin Mercy in 2 weeks.  Call if you have any fever or chills.  Pain medication is been sent into your pharmacy and you can pick it up on Saturday.  Dr. Lorin Mercy cell phone [phone] if problems

## 2019-04-13 NOTE — Evaluation (Signed)
Physical Therapy Evaluation Patient Details Name: Keith Espinoza MRN: [id number] DOB: Jul 12, 1941 Today's Date: 04/13/2019   History of Present Illness  78 yo male with onset of R hip dislocation during yardwork was admitted for relocation after having mult dislocations in the past.  Pt had revision to add a locking liner on R hip 7/10, MD noted significant scar tissue build up.  PMHx:  lumbar disc degeneration with fusion/rods, CAD, L foot burn, thrombocytopenia, L TKA, atherosclerosis, DM, HTN, GERD, urinary obstruction, PN, Gilbert syndrome, B12 defic, gout, cardiac stents, pancreatic pseudocyst,     Clinical Impression  Pt was seen for mobility with a significant effort to stand up but minor help to mobilize.  Pt is on O2 and at end of session was at 99% O2 sat on 2L, baseline on 2L was 99%.  Will work acutely to see if pt needs O2 with gait, and will progress strengthening ex's as tolerated within precautions.  Follow up with HHPT as pt is demonstrating deficits and has no recall of instructions.      Follow Up Recommendations Home health PT;Supervision for mobility/OOB    Equipment Recommendations  None recommended by PT    Recommendations for Other Services       Precautions / Restrictions Precautions Precautions: Fall;Posterior Hip Precaution Booklet Issued: Yes (comment) Precaution Comments: pt cannot accurately verbalize precautions, did not remember after teaching Required Braces or Orthoses: Knee Immobilizer - Right Knee Immobilizer - Right: On when out of bed or walking Restrictions Weight Bearing Restrictions: Yes Other Position/Activity Restrictions: WBAT      Mobility  Bed Mobility Overal bed mobility: Needs Assistance Bed Mobility: Supine to Sit     Supine to sit: Min assist     General bed mobility comments: min assist to support and help with RLE to side of bed  Transfers Overall transfer level: Needs assistance Equipment used: Rolling walker (2  wheeled);1 person hand held assist Transfers: Sit to/from Stand Sit to Stand: Mod assist         General transfer comment: mod to power up then was able to control with min guard  Ambulation/Gait Ambulation/Gait assistance: Min guard;Min assist Gait Distance (Feet): 75 Feet Assistive device: Rolling walker (2 wheeled);1 person hand held assist Gait Pattern/deviations: Step-through pattern;Wide base of support;Decreased stride length;Decreased weight shift to right Gait velocity: reduced Gait velocity interpretation: <1.31 ft/sec, indicative of household ambulator General Gait Details: pt is demonstrating poor control of RLE until upright but can use with brace to walk with cues for management of lines  Stairs            Wheelchair Mobility    Modified Rankin (Stroke Patients Only)       Balance Overall balance assessment: Needs assistance Sitting-balance support: Feet supported Sitting balance-Leahy Scale: Fair     Standing balance support: Bilateral upper extremity supported;During functional activity Standing balance-Leahy Scale: Poor Standing balance comment: RW is required for controlling balance in standing                             Pertinent Vitals/Pain      Home Living Family/patient expects to be discharged to:: Private residence Living Arrangements: Spouse/significant other Available Help at Discharge: Family;Available 24 hours/day Type of Home: House Home Access: Level entry     Home Layout: One level Home Equipment: Walker - 2 wheels;Cane - single point;Other (comment)(Wants to get a shower bench)  Prior Function Level of Independence: Independent with assistive device(s)         Comments: was out cutting the grass when his hip dislocated     Hand Dominance   Dominant Hand: Right    Extremity/Trunk Assessment   Upper Extremity Assessment Upper Extremity Assessment: Overall WFL for tasks assessed    Lower  Extremity Assessment Lower Extremity Assessment: RLE deficits/detail RLE Deficits / Details: in immobilizer with hip weakness RLE: Unable to fully assess due to immobilization RLE Coordination: decreased fine motor;decreased gross motor    Cervical / Trunk Assessment Cervical / Trunk Assessment: Kyphotic  Communication   Communication: No difficulties  Cognition Arousal/Alertness: Awake/alert Behavior During Therapy: WFL for tasks assessed/performed Overall Cognitive Status: Within Functional Limits for tasks assessed                                 General Comments: cannot recall posterior hip precautions after teaching them, handout given      General Comments General comments (skin integrity, edema, etc.): Pt is able to be positioned in chair to meet posterior precautions, but pt is not aware of them completely, requires coaching to get into safe posture    Exercises     Assessment/Plan    PT Assessment Patient needs continued PT services  PT Problem List Decreased strength;Decreased range of motion;Decreased activity tolerance;Decreased balance;Decreased mobility;Decreased coordination;Decreased knowledge of use of DME;Decreased safety awareness;Cardiopulmonary status limiting activity;Decreased skin integrity;Pain       PT Treatment Interventions DME instruction;Gait training;Functional mobility training;Therapeutic activities;Therapeutic exercise;Balance training;Neuromuscular re-education;Patient/family education    PT Goals (Current goals can be found in the Care Plan section)  Acute Rehab PT Goals Patient Stated Goal: to get stronger and go home soon PT Goal Formulation: With patient Time For Goal Achievement: 04/27/19 Potential to Achieve Goals: Good    Frequency Min 4X/week   Barriers to discharge Decreased caregiver support home with family but is requiring cues for mobility    Co-evaluation               AM-PAC PT "6 Clicks" Mobility   Outcome Measure Help needed turning from your back to your side while in a flat bed without using bedrails?: None Help needed moving from lying on your back to sitting on the side of a flat bed without using bedrails?: A Little Help needed moving to and from a bed to a chair (including a wheelchair)?: A Little Help needed standing up from a chair using your arms (e.g., wheelchair or bedside chair)?: A Lot Help needed to walk in hospital room?: A Little Help needed climbing 3-5 steps with a railing? : A Little 6 Click Score: 18    End of Session Equipment Utilized During Treatment: Gait belt;Oxygen(used length of line in room) Activity Tolerance: Patient tolerated treatment well;Patient limited by fatigue;Treatment limited secondary to medical complications (Comment)(precautions on R hip and O2 line) Patient left: in chair;with call bell/phone within reach;with chair alarm set Nurse Communication: Mobility status PT Visit Diagnosis: Unsteadiness on feet (R26.81);Muscle weakness (generalized) (M62.81);Pain Pain - Right/Left: Right Pain - part of body: Hip    Time: 1331-1410 PT Time Calculation (min) (ACUTE ONLY): 39 min   Charges:   PT Evaluation $PT Eval Moderate Complexity: 1 Mod PT Treatments $Gait Training: 8-22 mins $Therapeutic Activity: 8-22 mins       Ramond Dial 04/13/2019, 4:08 PM  Mee Hives, PT MS Acute Rehab Dept.  Number: [place] and [place]

## 2019-04-13 NOTE — Progress Notes (Signed)
CSW acknowledges SNF consult pending PT/OT recs.   Ashley Hill, LCSW [phone]  

## 2019-04-14 LAB — CBC
HCT: 29.7 % — ABNORMAL LOW (ref 39.0–52.0)
Hemoglobin: 10.2 g/dL — ABNORMAL LOW (ref 13.0–17.0)
MCH: 32.4 pg (ref 26.0–34.0)
MCHC: 34.3 g/dL (ref 30.0–36.0)
MCV: 94.3 fL (ref 80.0–100.0)
Platelets: 98 10*3/uL — ABNORMAL LOW (ref 150–400)
RBC: 3.15 MIL/uL — ABNORMAL LOW (ref 4.22–5.81)
RDW: 14.3 % (ref 11.5–15.5)
WBC: 5.5 10*3/uL (ref 4.0–10.5)
nRBC: 0 % (ref 0.0–0.2)

## 2019-04-14 LAB — BASIC METABOLIC PANEL
Anion gap: 8 (ref 5–15)
BUN: 13 mg/dL (ref 8–23)
CO2: 28 mmol/L (ref 22–32)
Calcium: 8.5 mg/dL — ABNORMAL LOW (ref 8.9–10.3)
Chloride: 104 mmol/L (ref 98–111)
Creatinine, Ser: 1.16 mg/dL (ref 0.61–1.24)
GFR calc Af Amer: >60 mL/min (ref >60)
GFR calc non Af Amer: 60 mL/min — ABNORMAL LOW (ref >60)
Glucose, Bld: 168 mg/dL — ABNORMAL HIGH (ref 70–99)
Potassium: 3.8 mmol/L (ref 3.5–5.1)
Sodium: 140 mmol/L (ref 135–145)

## 2019-04-14 LAB — GLUCOSE, CAPILLARY: Glucose-Capillary: 221 mg/dL — ABNORMAL HIGH (ref 70–99)

## 2019-04-14 NOTE — TOC Transition Note (Signed)
Transition of Care [facility]) - CM/SW Discharge Note   Patient Details  Name: Keith Espinoza MRN: [id number] Date of Birth: 16-Dec-1940  Transition of [facility]) CM/SW Contact:  Claudie Leach, RN Phone Number: 04/14/2019, 12:18 PM   Clinical Narrative:    Pt to return home with HHPT.  Patient has used [facility] in the past and would like to use again.  Patient has all necessary DME.   Final next level of care: [place] Barriers to Discharge: No Barriers Identified   Patient Goals and CMS Choice Patient states their goals for this hospitalization and ongoing recovery are:: to stay out of hospital CMS Medicare.gov Compare Post Acute Care list provided to:: Patient Choice offered to / list presented to : Patient   Discharge Plan and Services     Post Acute Care Choice: [place]: PT [facility] Agency: [place] ([facility]) Date [facility] Agency Contacted: 04/14/19 Time [place]: 1423 Representative spoke with at [place]: Lenna Sciara

## 2019-04-14 NOTE — Progress Notes (Signed)
Physical Therapy Treatment Patient Details Name: Keith Espinoza MRN: [id number] DOB: August 22, 1941 Today's Date: 04/14/2019    History of Present Illness 78 yo male with onset of R hip dislocation during yardwork was admitted for relocation after having mult dislocations in the past.  Pt had revision to add a locking liner on R hip 7/10, MD noted significant scar tissue build up.  PMHx:  lumbar disc degeneration with fusion/rods, CAD, L foot burn, thrombocytopenia, L TKA, atherosclerosis, DM, HTN, GERD, urinary obstruction, PN, Gilbert syndrome, B12 defic, gout, cardiac stents, pancreatic pseudocyst,       PT Comments    Pt supine on arrival and reports feeling stiff in bed.  Pt required increased assistance to mobilize to edge of bed,  He continues to require moderate assistance to achieve standing.  Pt is slow and guarded and continues to lack recall of posterior precautions.  During gt training pt reports he feels dizzy.  Asked tech in hall to bring recliner chair.  Pt sat in chair and reclined.  Required increased time to obtain machine to assess BP.  BP is supine ( reclined ) post episode - 127/69 BP in standing - 76/52 Bp in supine after transfer back to bed - 95/58  Pt is not safe to d/c home at this time with symptoms and need for increased assistance due to symptoms.  Will continue to recommend HHPT as he reports he will have help from a daughter and granddaughter.  Asked for OT order as well.    Follow Up Recommendations  Home health PT;Supervision for mobility/OOB     Equipment Recommendations  None recommended by PT    Recommendations for Other Services       Precautions / Restrictions Precautions Precautions: Fall;Posterior Hip Precaution Booklet Issued: Yes (comment) Precaution Comments: pt cannot accurately verbalize precautions, did not remember after teaching Required Braces or Orthoses: Knee Immobilizer - Right Knee Immobilizer - Right: On when out of bed or  walking Restrictions Weight Bearing Restrictions: Yes Other Position/Activity Restrictions: WBAT    Mobility  Bed Mobility Overal bed mobility: Needs Assistance Bed Mobility: Supine to Sit;Sit to Supine     Supine to sit: Mod assist     General bed mobility comments: Pt with strong posterior bias and listing to the L.  Heavy reliance UE support to maintain standing.  Pt is slow and guarded.  Required moderate assistance to advance RLE to edge of bed and to elevate trunk into sitting.  To return to bed he starts to lie down without assistance but unable to pull his B LEs back to bed.  Transfers Overall transfer level: Needs assistance Equipment used: Rolling walker (2 wheeled) Transfers: Sit to/from Stand Sit to Stand: Mod assist;+2 physical assistance         General transfer comment: On 1st attempt from elevate bed he required mod +1, on second attempt from recliner he required mod +2 to achieve standing from stand seat height.  Cues to advance RLE forward during transfers to maintain posterior hip precautions.  Ambulation/Gait Ambulation/Gait assistance: Min guard;Min assist Gait Distance (Feet): 150 Feet Assistive device: Rolling walker (2 wheeled) Gait Pattern/deviations: Step-through pattern;Wide base of support;Decreased stride length;Decreased weight shift to right Gait velocity: reduced   General Gait Details: Pt intially required cueing for sequencing but then quickly progressed to step through pattern.  Pt reports feeling dizzy around 150 ft and starts to grab for railing in hall and reports feeling nauseous and dizzy.  Had tech bring  chair to allow patient to sit and be transported back to room in recliner.   Stairs             Wheelchair Mobility    Modified Rankin (Stroke Patients Only)       Balance Overall balance assessment: Needs assistance Sitting-balance support: Feet supported Sitting balance-Leahy Scale: Poor Sitting balance - Comments:  posterior lean unable to sit without heavy reliance of UEs   Standing balance support: Bilateral upper extremity supported;During functional activity Standing balance-Leahy Scale: Poor Standing balance comment: RW is required for controlling balance in standing                            Cognition Arousal/Alertness: Awake/alert Behavior During Therapy: WFL for tasks assessed/performed Overall Cognitive Status: Within Functional Limits for tasks assessed                                 General Comments: Remains unable to recall hip precautions.  He did recall 1/3 precautions.  Unsure if patient is a reliable historian.      Exercises      General Comments        Pertinent Vitals/Pain Pain Assessment: 0-10 Pain Score: 8  Pain Descriptors / Indicators: Aching;Grimacing;Guarding Pain Intervention(s): Monitored during session;Repositioned    Home Living                      Prior Function            PT Goals (current goals can now be found in the care plan section) Acute Rehab PT Goals Patient Stated Goal: to get stronger and go home soon Potential to Achieve Goals: Good Progress towards PT goals: Progressing toward goals    Frequency    Min 4X/week      PT Plan Current plan remains appropriate    Co-evaluation              AM-PAC PT "6 Clicks" Mobility   Outcome Measure  Help needed turning from your back to your side while in a flat bed without using bedrails?: None Help needed moving from lying on your back to sitting on the side of a flat bed without using bedrails?: A Lot Help needed moving to and from a bed to a chair (including a wheelchair)?: A Lot Help needed standing up from a chair using your arms (e.g., wheelchair or bedside chair)?: A Lot Help needed to walk in hospital room?: A Little Help needed climbing 3-5 steps with a railing? : A Lot 6 Click Score: 15    End of Session Equipment Utilized During  Treatment: Gait belt;Oxygen Activity Tolerance: Patient tolerated treatment well;Patient limited by fatigue;Treatment limited secondary to medical complications (Comment) Patient left: in chair;with call bell/phone within reach;with chair alarm set Nurse Communication: Mobility status PT Visit Diagnosis: Unsteadiness on feet (R26.81);Muscle weakness (generalized) (M62.81);Pain Pain - Right/Left: Right Pain - part of body: Hip     Time: 1610-9604 PT Time Calculation (min) (ACUTE ONLY): 26 min  Charges:  $Gait Training: 8-22 mins $Therapeutic Activity: 8-22 mins                     Governor Rooks, PTA Acute Rehabilitation Services Pager [phone] Office [phone]     Aimee Eli Hose 04/14/2019, 12:30 PM

## 2019-04-14 NOTE — Plan of Care (Signed)
  Problem: Pain Managment: Goal: General experience of comfort will improve Outcome: Progressing   Problem: Safety: Goal: Ability to remain free from injury will improve Outcome: Progressing   

## 2019-04-14 NOTE — Progress Notes (Signed)
   Subjective:  Patient reports pain as sore.   Foley removed this morning.  Objective:   VITALS:   Vitals:   04/13/19 2107 04/14/19 0053 04/14/19 0531 04/14/19 0843  BP: (!) 152/70 140/60 (!) 156/67 (!) 161/87  Pulse: 75 74 78 82  Resp:  17  20  Temp: 98.1 F (36.7 C) 97.8 F (36.6 C) 97.7 F (36.5 C) (!) 97.5 F (36.4 C)  TempSrc: Oral Oral Oral Oral  SpO2: 99% 97% 98% 100%    Neurologically intact Neurovascular intact Sensation intact distally Intact pulses distally Dorsiflexion/Plantar flexion intact Incision: dressing C/D/I and no drainage No cellulitis present Compartment soft   Lab Results  Component Value Date   WBC 5.5 04/14/2019   HGB 10.2 (L) 04/14/2019   HCT 29.7 (L) 04/14/2019   MCV 94.3 04/14/2019   PLT 98 (L) 04/14/2019     Assessment/Plan:  1 Day Post-Op   - Expected postop acute blood loss anemia - will monitor for symptoms - Up with PT/OT - DVT ppx - SCDs, ambulation, aspirin and plavix - WBAT operative extremity, posterior hip precautions, KI while in bed and as needed with ambulation - will need to void before d/c - d/c home after 1 more session of PT  Eduard Roux 04/14/2019, 8:45 AM [phone]

## 2019-04-14 NOTE — Evaluation (Signed)
Occupational Therapy Evaluation Patient Details Name: Keith Espinoza MRN: [id number] DOB: 09/08/1941 Today's Date: 04/14/2019    History of Present Illness 78 yo male with onset of R hip dislocation during yardwork was admitted for relocation after having mult dislocations in the past.  Pt had revision to add a locking liner on R hip 7/10, MD noted significant scar tissue build up.  PMHx:  lumbar disc degeneration with fusion/rods, CAD, L foot burn, thrombocytopenia, L TKA, atherosclerosis, DM, HTN, GERD, urinary obstruction, PN, Gilbert syndrome, B12 defic, gout, cardiac stents, pancreatic pseudocyst,      Clinical Impression   PTA patient independent. Admitted for above and limited by problem list below, including pain, impaired balance, generalized weakness and decreased activity tolerance. Patient educated on hip precautions, ADL compensatory techniques and safety. Patient requires setup for UB ADLs, min assist for LB ADLs, min guard for transfers, and mod assist for bed mobility.  He reports having AE at home for use during LB self care, and will have 24/7 support from family.  He continues to require min cueing for hip precautions and adherence functionally, max encouragement to engage in OT this afternoon due to pain.  BP with mobility WFL this session.  Will follow acutely and believe he will benefit from further OT services while admitted and after dc at [facility] level in order to maximize independence and safety.      Follow Up Recommendations  Home health OT;Supervision/Assistance - 24 hour    Equipment Recommendations  None recommended by OT    Recommendations for Other Services       Precautions / Restrictions Precautions Precautions: Fall;Posterior Hip Precaution Booklet Issued: Yes (comment) Precaution Comments: pt recall 2/3 precautions throughout session, cueing to adhere to functionally Required Braces or Orthoses: Knee Immobilizer - Right Knee Immobilizer - Right: On when  out of bed or walking Restrictions Weight Bearing Restrictions: Yes Other Position/Activity Restrictions: WBAT      Mobility Bed Mobility Overal bed mobility: Needs Assistance Bed Mobility: Supine to Sit;Sit to Supine     Supine to sit: Min guard Sit to supine: Mod assist   General bed mobility comments: min guard for safety with increased time to transition to EOB, returned to supine with poor eccentric and trunk control, assist for mgmt of B LEs  Transfers Overall transfer level: Needs assistance Equipment used: Rolling walker (2 wheeled) Transfers: Sit to/from Stand Sit to Stand: Min guard;From elevated surface         General transfer comment: min guard to ascend from EOB with cueing for hand placement, technique; increased time and effort    Balance Overall balance assessment: Needs assistance Sitting-balance support: Feet supported Sitting balance-Leahy Scale: Fair Sitting balance - Comments: preference to UE support but able to sit statically with min guard    Standing balance support: Bilateral upper extremity supported;During functional activity;No upper extremity supported Standing balance-Leahy Scale: Poor Standing balance comment: static standing with min guard, reliant on B UE support during mobility                            ADL either performed or assessed with clinical judgement   ADL Overall ADL's : Needs assistance/impaired     Grooming: Set up;Sitting   Upper Body Bathing: Set up;Sitting   Lower Body Bathing: Minimal assistance;Sit to/from stand Lower Body Bathing Details (indicate cue type and reason): hip precautions, min guard in standing  Upper Body Dressing : Set  up;Sitting   Lower Body Dressing: Minimal assistance;Sit to/from stand;With adaptive equipment Lower Body Dressing Details (indicate cue type and reason): hip precautions, pt has AE at home to use for LB dressing; min guard in standing  Toilet Transfer: Min  guard;Ambulation;RW Toilet Transfer Details (indicate cue type and reason): simulated in room          Functional mobility during ADLs: Min guard;Rolling walker General ADL Comments: limited by pain, hip precautions      Vision         Perception     Praxis      Pertinent Vitals/Pain Pain Assessment: 0-10 Pain Score: 8  Pain Descriptors / Indicators: Aching;Grimacing;Guarding Pain Intervention(s): Monitored during session;Repositioned     Hand Dominance Right   Extremity/Trunk Assessment Upper Extremity Assessment Upper Extremity Assessment: Overall WFL for tasks assessed   Lower Extremity Assessment Lower Extremity Assessment: Defer to PT evaluation   Cervical / Trunk Assessment Cervical / Trunk Assessment: Kyphotic   Communication Communication Communication: No difficulties   Cognition Arousal/Alertness: Awake/alert Behavior During Therapy: WFL for tasks assessed/performed Overall Cognitive Status: Within Functional Limits for tasks assessed                                 General Comments: some short term memory loss but anticipate this is baseline    General Comments       Exercises     Shoulder Instructions      Home Living Family/patient expects to be discharged to:: Private residence Living Arrangements: Spouse/significant other Available Help at Discharge: Family;Available 24 hours/day Type of Home: House Home Access: Level entry     Home Layout: One level     Bathroom Shower/Tub: Occupational psychologist: Standard     Home Equipment: Environmental consultant - 2 wheels;Cane - single point;Shower seat;Bedside commode;Adaptive equipment Adaptive Equipment: Reacher;Sock aid        Prior Functioning/Environment Level of Independence: Independent with assistive device(s)        Comments: was out cutting the grass when his hip dislocated        OT Problem List: Decreased activity tolerance;Impaired balance (sitting and/or  standing);Decreased safety awareness;Decreased knowledge of use of DME or AE;Decreased knowledge of precautions;Pain      OT Treatment/Interventions: Self-care/ADL training;DME and/or AE instruction;Balance training;Therapeutic activities;Patient/family education    OT Goals(Current goals can be found in the care plan section) Acute Rehab OT Goals Patient Stated Goal: to get stronger and go home soon OT Goal Formulation: With patient Time For Goal Achievement: 04/28/19 Potential to Achieve Goals: Good  OT Frequency: Min 2X/week   Barriers to D/C:            Co-evaluation              AM-PAC OT "6 Clicks" Daily Activity     Outcome Measure Help from another person eating meals?: None Help from another person taking care of personal grooming?: A Little Help from another person toileting, which includes using toliet, bedpan, or urinal?: A Little Help from another person bathing (including washing, rinsing, drying)?: A Little Help from another person to put on and taking off regular upper body clothing?: None Help from another person to put on and taking off regular lower body clothing?: A Little 6 Click Score: 20   End of Session Equipment Utilized During Treatment: Rolling walker Nurse Communication: Mobility status;Other (comment)(pain)  Activity Tolerance: Patient limited by  pain Patient left: in bed;with call bell/phone within reach;with bed alarm set  OT Visit Diagnosis: Other abnormalities of gait and mobility (R26.89);Pain Pain - Right/Left: Right Pain - part of body: Hip;Leg                Time: 9163-8466 OT Time Calculation (min): 26 min Charges:  OT General Charges $OT Visit: 1 Visit OT Evaluation $OT Eval Moderate Complexity: 1 Mod OT Treatments $Self Care/Home Management : 8-22 mins  Delight Stare, OT Acute Rehabilitation Services Pager [phone] Office [phone]   Delight Stare 04/14/2019, 2:19 PM

## 2019-04-14 NOTE — Plan of Care (Signed)
  Problem: Education: Goal: Knowledge of General Education information will improve Description Including pain rating scale, medication(s)/side effects and non-pharmacologic comfort measures Outcome: Progressing   

## 2019-04-14 NOTE — Discharge Summary (Signed)
Physician Discharge Summary      Patient ID: Keith Espinoza MRN: [id number] DOB/AGE: [birth year] 78 y.o.  Admit date: 04/13/2019 Discharge date: 04/14/2019  Admission Diagnoses:  <principal problem not specified>  Discharge Diagnoses:  Active Problems:   Recurrent dislocation of right hip   Hip dislocation, right (HCC)   Past Medical History:  Diagnosis Date   Allergic rhinitis    Allergic rhinitis    Arthritis    Chronic leg pain    right   Chronic lower back pain    Coronary artery disease    a. Stenting to RCA 2004; staged DES to LAD and Cx 2004. DES to mRCA 2012. b. DES to mCx, PTCA to dCx 11/2011. c. Lateral wall MI 2013 s/p PTCA to distal Cx & DES to mid OM2 11/2011. d. Low risk nuc 04/2014, EF wnl.   Diabetes mellitus    Insulin dependent   Diabetic neuropathy (HCC)    MILD   Diverticulosis    Dysrhythmia    [name] syndrome    Gout    right wrist; right foot; right elbow; have had it since 1970's   H/O hiatal hernia    Heart murmur    History of echocardiogram    aortic sclerosis per echo 12/09 EF 65%, otherwise normal   History of hemorrhoids    BLEEDING   History of kidney stones    h/o   Hypertension    Diagnosed 1995    Pancreatic pseudocyst    a. s/p remote drainage 2006.   Thrombocytopenia ([place])    Seen on oldest labs in system from 2004   Vitamin B 12 deficiency    orally replaced    Surgeries: Procedure(s): RIGHT TOTAL HIP REVISION-POSTERIOR  APPROACH LATERAL on 04/13/2019   Consultants (if any):   Discharged Condition: Improved  Hospital Course: Keith Espinoza is an 78 y.o. male who was admitted 04/13/2019 with a diagnosis of <principal problem not specified> and went to the operating room on 04/13/2019 and underwent the above named procedures.    He was given perioperative antibiotics:  Anti-infectives (From admission, onward)   Start     Dose/Rate Route Frequency Ordered Stop   04/13/19 1400  ceFAZolin (ANCEF) IVPB 1  g/50 mL premix     1 g 100 mL/hr over 30 Minutes Intravenous Every 8 hours 04/13/19 1215 04/13/19 2243   04/13/19 0600  ceFAZolin (ANCEF) IVPB 2g/100 mL premix     2 g 200 mL/hr over 30 Minutes Intravenous On call to O.R. 04/13/19 0541 04/13/19 0753   04/13/19 0548  ceFAZolin (ANCEF) 2-4 GM/100ML-% IVPB    Note to Pharmacy: Nyoka Cowden   : cabinet override      04/13/19 0548 04/13/19 0753    .  He was given sequential compression devices, early ambulation, and appropriate chemoprophylaxis for DVT prophylaxis.  He benefited maximally from the hospital stay and there were no complications.    Recent vital signs:  Vitals:   04/14/19 0531 04/14/19 0843  BP: (!) 156/67 (!) 161/87  Pulse: 78 82  Resp:  20  Temp: 97.7 F (36.5 C) (!) 97.5 F (36.4 C)  SpO2: 98% 100%    Recent laboratory studies:  Lab Results  Component Value Date   HGB 10.2 (L) 04/14/2019   HGB 13.9 04/10/2019   HGB 12.6 (L) 02/15/2019   Lab Results  Component Value Date   WBC 5.5 04/14/2019   PLT 98 (L) 04/14/2019   Lab Results  Component Value  Date   INR 1.1 04/10/2019   Lab Results  Component Value Date   NA 140 04/14/2019   K 3.8 04/14/2019   CL 104 04/14/2019   CO2 28 04/14/2019   BUN 13 04/14/2019   CREATININE 1.16 04/14/2019   GLUCOSE 168 (H) 04/14/2019    Discharge Medications:   Allergies as of 04/14/2019      Reactions   Simvastatin Other (See Comments)   SEVERE MYALGIAS   Zetia [ezetimibe] Other (See Comments)   MYALGIAS   Dilaudid [hydromorphone Hcl]    hallucination   Fish Oil Nausea Only      Medication List    STOP taking these medications   acetaminophen 500 MG tablet Commonly known as: TYLENOL   traMADol 50 MG tablet Commonly known as: ULTRAM     TAKE these medications   allopurinol 300 MG tablet Commonly known as: ZYLOPRIM Take 300 mg by mouth daily.   amLODipine 2.5 MG tablet Commonly known as: NORVASC Take 2.5 mg by mouth daily.   aspirin EC 81 MG  tablet Take 81 mg by mouth daily.   clopidogrel 75 MG tablet Commonly known as: PLAVIX TAKE ONE TABLET BY MOUTH ONE TIME DAILY   gabapentin 300 MG capsule Commonly known as: NEURONTIN Take 600 mg by mouth 2 (two) times daily.   glimepiride 4 MG tablet Commonly known as: AMARYL Take 4 mg by mouth 2 (two) times daily.   hydrochlorothiazide 12.5 MG capsule Commonly known as: MICROZIDE Take 12.5 mg by mouth daily.   HYDROcodone-acetaminophen 5-325 MG tablet Commonly known as: Norco Take 1 tablet by mouth every 4 (four) hours as needed for moderate pain. Post hip revision pain   insulin detemir 100 UNIT/ML injection Commonly known as: LEVEMIR Inject 12-30.5 Units into the skin at bedtime.   isosorbide mononitrate 30 MG 24 hr tablet Commonly known as: IMDUR Take 30 mg by mouth daily.   Klor-Con M20 20 MEQ tablet Generic drug: potassium chloride SA Take 20 mEq by mouth daily.   losartan 50 MG tablet Commonly known as: COZAAR Take 50 mg by mouth daily.   metFORMIN 1000 MG tablet Commonly known as: GLUCOPHAGE Take 1 tablet (1,000 mg total) by mouth 2 (two) times daily.   nitroGLYCERIN 0.4 MG SL tablet Commonly known as: NITROSTAT Place 0.4 mg under the tongue every 5 (five) minutes as needed for chest pain.   ONE TOUCH ULTRA TEST test strip Generic drug: glucose blood CHECK BLOOD SUGAR ONCE DAILY AS DIRECTED   rosuvastatin 10 MG tablet Commonly known as: CRESTOR Take 1 tablet (10 mg total) by mouth once a week.   terazosin 5 MG capsule Commonly known as: HYTRIN Take 5 mg by mouth at bedtime.   Vitamin D3 50 MCG (2000 UT) Tabs Take 2,000 Units by mouth daily.       Diagnostic Studies: Dg Chest 2 View  Result Date: 04/10/2019 CLINICAL DATA:  Preop for right total hip arthroplasty revision. EXAM: CHEST - 2 VIEW COMPARISON:  Radiographs of July 14, 2016. FINDINGS: The heart size and mediastinal contours are within normal limits. Both lungs are clear. The  visualized skeletal structures are unremarkable. IMPRESSION: No active cardiopulmonary disease. Electronically Signed   By: Marijo Conception M.D.   On: 04/10/2019 16:48   Dg Hip Port Unilat With Pelvis 1v Right  Result Date: 04/13/2019 CLINICAL DATA:  Status post right hip replacement. EXAM: DG HIP (WITH OR WITHOUT PELVIS) 1V PORT RIGHT COMPARISON:  Radiographs of April 11, 2019.  FINDINGS: The right femoral and acetabular components appear to be well situated. Expected postoperative changes are seen in the surrounding soft tissues. IMPRESSION: Status post right total hip arthroplasty. Electronically Signed   By: Marijo Conception M.D.   On: 04/13/2019 11:42   Dg Hip Port Unilat With Pelvis 1v Right  Result Date: 04/11/2019 CLINICAL DATA:  Status post reduction of right hip. EXAM: DG HIP (WITH OR WITHOUT PELVIS) 1V PORT RIGHT COMPARISON:  April 11, 2019 FINDINGS: The right hip dislocation seen earlier today appears to have been been reduced based on 2 frontal views. IMPRESSION: The right hip dislocations seen earlier today appears to been reduced on 2 frontal views. Electronically Signed   By: Dorise Bullion III M.D   On: 04/11/2019 17:01   Dg Hip Operative Unilat W Or W/o Pelvis Right  Result Date: 04/13/2019 CLINICAL DATA:  Intraop image for hardware placement EXAM: OPERATIVE RIGHT HIP (WITH PELVIS IF PERFORMED) single VIEWS TECHNIQUE: Fluoroscopic spot image(s) were submitted for interpretation post-operatively. COMPARISON:  None. FINDINGS: Cross-table lateral view demonstrating RIGHT hip arthroplasty hardware. Hardware appears intact and appropriately positioned. Expected postsurgical changes within the surrounding soft tissues. IMPRESSION: RIGHT hip arthroplasty hardware appears intact and appropriately positioned. No evidence of surgical complicating feature. Electronically Signed   By: Franki Cabot M.D.   On: 04/13/2019 12:11   Dg Hip Unilat With Pelvis 2-3 Views Right  Result Date:  04/11/2019 CLINICAL DATA:  Right hip pain. Recent hip dislocations. History of a right hip arthroplasty. EXAM: DG HIP (WITH OR WITHOUT PELVIS) 2-3V RIGHT COMPARISON:  02/15/2019 FINDINGS: Posterior dislocation of femoral component of the right hip arthroplasty in relation to the acetabular component, similar to the exam on 02/15/2019. No acute fracture. The femoral and acetabular components appear well seated. IMPRESSION: Dislocated right hip prosthesis. Electronically Signed   By: Lajean Manes M.D.   On: 04/11/2019 13:27    Disposition: Discharge disposition: 01-Home or Self Care       Discharge Instructions    Call MD / Call 911   Complete by: As directed    If you experience chest pain or shortness of breath, CALL 911 and be transported to the hospital emergency room.  If you develope a fever above 101.5 F, pus (white drainage) or increased drainage or redness at the wound, or calf pain, call your surgeon's office.   Constipation Prevention   Complete by: As directed    Drink plenty of fluids.  Prune juice may be helpful.  You may use a stool softener, such as Colace (over the counter) 100 mg twice a day.  Use MiraLax (over the counter) for constipation as needed.   Driving restrictions   Complete by: As directed    No driving while taking narcotic pain meds.   Increase activity slowly as tolerated   Complete by: As directed       Follow-up Information    Marybelle Killings, MD Follow up in 2 week(s).   Specialty: Orthopedic Surgery Contact information: [address] [place] [address] [phone]            Signed: Eduard Roux 04/14/2019, 8:48 AM

## 2019-04-15 LAB — CBC
HCT: 27.5 % — ABNORMAL LOW (ref 39.0–52.0)
Hemoglobin: 9.6 g/dL — ABNORMAL LOW (ref 13.0–17.0)
MCH: 32.1 pg (ref 26.0–34.0)
MCHC: 34.9 g/dL (ref 30.0–36.0)
MCV: 92 fL (ref 80.0–100.0)
Platelets: 119 10*3/uL — ABNORMAL LOW (ref 150–400)
RBC: 2.99 MIL/uL — ABNORMAL LOW (ref 4.22–5.81)
RDW: 13.9 % (ref 11.5–15.5)
WBC: 8.4 10*3/uL (ref 4.0–10.5)
nRBC: 0 % (ref 0.0–0.2)

## 2019-04-15 NOTE — Plan of Care (Signed)
  Problem: Pain Managment: Goal: General experience of comfort will improve Outcome: Progressing   Problem: Safety: Goal: Ability to remain free from injury will improve Outcome: Progressing   Problem: Skin Integrity: Goal: Risk for impaired skin integrity will decrease Outcome: Progressing   

## 2019-04-15 NOTE — Plan of Care (Signed)
  Problem: Health Behavior/Discharge Planning: Goal: Ability to manage health-related needs will improve Outcome: Adequate for Discharge   Problem: Clinical Measurements: Goal: Cardiovascular complication will be avoided Outcome: Adequate for Discharge   Problem: Activity: Goal: Risk for activity intolerance will decrease Outcome: Adequate for Discharge   Problem: Safety: Goal: Ability to remain free from injury will improve Outcome: Adequate for Discharge

## 2019-04-15 NOTE — Progress Notes (Signed)
Physical Therapy Treatment Patient Details Name: Keith Espinoza MRN: [id number] DOB: 1940-10-29 Today's Date: 04/15/2019    History of Present Illness 78 yo male with onset of R hip dislocation during yardwork was admitted for relocation after having mult dislocations in the past.  Pt had revision to add a locking liner on R hip 7/10, MD noted significant scar tissue build up.  PMHx:  lumbar disc degeneration with fusion/rods, CAD, L foot burn, thrombocytopenia, L TKA, atherosclerosis, DM, HTN, GERD, urinary obstruction, PN, Gilbert syndrome, B12 defic, gout, cardiac stents, pancreatic pseudocyst,       PT Comments    Pt feeling better today. Orthostatic vitals WFL. Pt required min assist bed mobility, min guard assist transfers, and min guard assist ambulation 150 feet with RW. Pt required cues to maintain posterior hip precautions. KI in place during mobility. Pt in recliner with feet elevated at end of session. Pt reports he will have 24-hour assist at home.    Follow Up Recommendations  Home health PT;Supervision/Assistance - 24 hour     Equipment Recommendations  None recommended by PT    Recommendations for Other Services       Precautions / Restrictions Precautions Precautions: Fall;Posterior Hip Precaution Comments: Reviewed 3/3 precautions. Pt required cues during session to maintain. Required Braces or Orthoses: Knee Immobilizer - Right Knee Immobilizer - Right: Other (comment)(on in bed and during amb as needed) Restrictions Weight Bearing Restrictions: Yes RLE Weight Bearing: Weight bearing as tolerated    Mobility  Bed Mobility Overal bed mobility: Needs Assistance Bed Mobility: Supine to Sit     Supine to sit: Min assist;HOB elevated     General bed mobility comments: +rail, assist with RLE, cues for sequencing, increased time and effort  Transfers Overall transfer level: Needs assistance Equipment used: Rolling walker (2 wheeled) Transfers: Sit to/from  Stand Sit to Stand: Min guard         General transfer comment: increased time and effort  Ambulation/Gait Ambulation/Gait assistance: Min guard Gait Distance (Feet): 150 Feet Assistive device: Rolling walker (2 wheeled) Gait Pattern/deviations: Step-through pattern;Decreased stride length;Decreased weight shift to right Gait velocity: decreased Gait velocity interpretation: <1.31 ft/sec, indicative of household ambulator General Gait Details: slow, steady gait. 2 standing rest breaks during ambulation. No LOB. Min guard assist for safety   Stairs             Wheelchair Mobility    Modified Rankin (Stroke Patients Only)       Balance Overall balance assessment: Needs assistance Sitting-balance support: Feet supported;No upper extremity supported Sitting balance-Leahy Scale: Good     Standing balance support: No upper extremity supported Standing balance-Leahy Scale: Poor Standing balance comment: static standing with min guard and no UE support, reliant on B UE support during mobility                            Cognition Arousal/Alertness: Awake/alert Behavior During Therapy: WFL for tasks assessed/performed Overall Cognitive Status: Within Functional Limits for tasks assessed                                        Exercises Total Joint Exercises Ankle Circles/Pumps: AROM;Both;20 reps    General Comments General comments (skin integrity, edema, etc.): Orthostatic vitals WFL, no c/o dizziness this session      Pertinent Vitals/Pain Pain Assessment: 0-10  Pain Score: 8  Pain Location: R hip Pain Descriptors / Indicators: Aching;Grimacing;Guarding;Sore Pain Intervention(s): Repositioned;Ice applied;Patient requesting pain meds-RN notified    Home Living                      Prior Function            PT Goals (current goals can now be found in the care plan section) Acute Rehab PT Goals Patient Stated Goal: home  today PT Goal Formulation: With patient Time For Goal Achievement: 04/27/19 Potential to Achieve Goals: Good Progress towards PT goals: Progressing toward goals    Frequency    Min 4X/week      PT Plan Current plan remains appropriate    Co-evaluation              AM-PAC PT "6 Clicks" Mobility   Outcome Measure  Help needed turning from your back to your side while in a flat bed without using bedrails?: None Help needed moving from lying on your back to sitting on the side of a flat bed without using bedrails?: A Little Help needed moving to and from a bed to a chair (including a wheelchair)?: A Little Help needed standing up from a chair using your arms (e.g., wheelchair or bedside chair)?: A Little Help needed to walk in hospital room?: A Little Help needed climbing 3-5 steps with a railing? : A Lot 6 Click Score: 18    End of Session Equipment Utilized During Treatment: Gait belt Activity Tolerance: Patient tolerated treatment well Patient left: in chair;with call bell/phone within reach Nurse Communication: Mobility status PT Visit Diagnosis: Unsteadiness on feet (R26.81);Muscle weakness (generalized) (M62.81);Pain Pain - Right/Left: Right Pain - part of body: Hip     Time: 6979-4801 PT Time Calculation (min) (ACUTE ONLY): 25 min  Charges:  $Gait Training: 23-37 mins                     Lorrin Goodell, [place]  Office # [phone] Pager [phone]    Keith Espinoza 04/15/2019, 9:28 AM

## 2019-04-15 NOTE — TOC Transition Note (Signed)
Transition of Care [facility]) - CM/SW Discharge Note   Patient Details  Name: Keith Espinoza MRN: [id number] Date of Birth: 09-Apr-1941  [facility] of [facility]) CM/SW Contact:  Bartholomew Crews, RN Phone Number: [phone] 04/15/2019, 11:33 AM   Clinical Narrative:    Patient to transition home today. Previous CM arranged [place] PT with [facility]. Left message for Melissa at [facility] of patient transition home today. No DME needs. No other transition of care needs identified.    Final next level of care: [place] Barriers to Discharge: No Barriers Identified   Patient Goals and CMS Choice Patient states their goals for this hospitalization and ongoing recovery are:: to stay out of hospital CMS Medicare.gov Compare Post Acute Care list provided to:: Patient Choice offered to / list presented to : Patient  Discharge Placement                       Discharge Plan and Services     Post Acute Care Choice: Home Health                    HH Arranged: PT [facility] Agency: [place] ([facility] Date [facility] Agency Contacted: 04/14/19 Time [place]: [id number] Representative spoke with at [place]: [place] ([place]) Interventions     Readmission Risk Interventions No flowsheet data found.

## 2019-04-15 NOTE — Progress Notes (Signed)
Pt given discharge instructions and gone over with him. Pt verbalized understanding of instructions. KI on. All belongings gathered to be sent home.

## 2019-04-15 NOTE — Progress Notes (Signed)
Occupational Therapy Treatment Patient Details Name: Keith Espinoza MRN: [id number] DOB: Oct 02, 1941 Today's Date: 04/15/2019    History of present illness 78 yo male with onset of R hip dislocation during yardwork was admitted for relocation after having mult dislocations in the past.  Pt had revision to add a locking liner on R hip 7/10, MD noted significant scar tissue build up.  PMHx:  lumbar disc degeneration with fusion/rods, CAD, L foot burn, thrombocytopenia, L TKA, atherosclerosis, DM, HTN, GERD, urinary obstruction, PN, Gilbert syndrome, B12 defic, gout, cardiac stents, pancreatic pseudocyst,      OT comments  Patient seated in recliner and agreeable to OT.  Reports he will have 24/7 support at dc initially, until his granddaughter starts teaching in the fall again.  Patient completes toilet transfers with min guard to 3:1 using RW, but noted mod assist to ascend from low recliner with poor eccentric control returning to sitting on recliner. Limited mobility due to feeling "woozy", BP 113/53- improved with sitting rest break.  Cueing throughout session to adhere to posterior hip precautions, able to verbally recall 2/3 without cueing. Continue to recommend [place] and 24/7 support.      Follow Up Recommendations  Home health OT;Supervision/Assistance - 24 hour    Equipment Recommendations  None recommended by OT    Recommendations for Other Services      Precautions / Restrictions Precautions Precautions: Fall;Posterior Hip Precaution Booklet Issued: Yes (comment) Precaution Comments: reviewed precautions, recalled 2/3 without cueing; cueing to adhere functionally Required Braces or Orthoses: Knee Immobilizer - Right Knee Immobilizer - Right: Other (comment)(on in bed and during mobility as needed) Restrictions Weight Bearing Restrictions: Yes RLE Weight Bearing: Weight bearing as tolerated       Mobility Bed Mobility Overal bed mobility: Needs Assistance Bed Mobility: Supine  to Sit     Supine to sit: Min assist;HOB elevated     General bed mobility comments: OOB upon entry   Transfers Overall transfer level: Needs assistance Equipment used: Rolling walker (2 wheeled) Transfers: Sit to/from Stand Sit to Stand: Min guard;Mod assist         General transfer comment: min guard to power up from [facility] but mod assist from recliner, continues to require cueing for precautions, hand placement and technique; noted poor eccentric control when descending to recliner at completion of session     Balance Overall balance assessment: Needs assistance Sitting-balance support: Feet supported;No upper extremity supported Sitting balance-Leahy Scale: Good Sitting balance - Comments: close supervision    Standing balance support: No upper extremity supported;During functional activity;Bilateral upper extremity supported Standing balance-Leahy Scale: Poor Standing balance comment: static standing with min guard and no UE support, reliant on B UE support during mobility                           ADL either performed or assessed with clinical judgement   ADL Overall ADL's : Needs assistance/impaired     Grooming: Set up;Sitting Grooming Details (indicate cue type and reason): attempted to complete grooming at sink, pt with pain and "wooziness" limiting standing tolerance               Lower Body Dressing Details (indicate cue type and reason): verbally reviewed use of reacher for LB dressing, plans to have assistance  Toilet Transfer: Min IT trainer Details (indicate cue type and reason): cueing for hand placement and safety, min guard for safety  Functional mobility during ADLs: Min guard;Rolling walker General ADL Comments: limited by pain, hip precautions      Vision       Perception     Praxis      Cognition Arousal/Alertness: Awake/alert Behavior During Therapy: WFL for tasks  assessed/performed Overall Cognitive Status: Within Functional Limits for tasks assessed                                 General Comments: continues to require cueing to adhere to hip precautions         Exercises Total Joint Exercises Ankle Circles/Pumps: AROM;Both;20 reps   Shoulder Instructions       General Comments woozy during mobility, BP 113/53 once seated in recliner     Pertinent Vitals/ Pain       Pain Assessment: Faces Pain Score: 8  Faces Pain Scale: Hurts even more Pain Location: R hip Pain Descriptors / Indicators: Aching;Grimacing;Guarding;Sore Pain Intervention(s): Repositioned;Ice applied;Monitored during session;Limited activity within patient's tolerance  Home Living                                          Prior Functioning/Environment              Frequency  Min 2X/week        Progress Toward Goals  OT Goals(current goals can now be found in the care plan section)  Progress towards OT goals: Progressing toward goals  Acute Rehab OT Goals Patient Stated Goal: home today OT Goal Formulation: With patient  Plan Discharge plan remains appropriate;Frequency remains appropriate    Co-evaluation                 AM-PAC OT "6 Clicks" Daily Activity     Outcome Measure   Help from another person eating meals?: None Help from another person taking care of personal grooming?: A Little Help from another person toileting, which includes using toliet, bedpan, or urinal?: A Little Help from another person bathing (including washing, rinsing, drying)?: A Little Help from another person to put on and taking off regular upper body clothing?: None Help from another person to put on and taking off regular lower body clothing?: A Little 6 Click Score: 20    End of Session Equipment Utilized During Treatment: Rolling walker  OT Visit Diagnosis: Other abnormalities of gait and mobility (R26.89);Pain Pain -  Right/Left: Right Pain - part of body: Hip;Leg   Activity Tolerance Patient limited by pain   Patient Left in chair;with call bell/phone within reach   Nurse Communication Mobility status        Time: 3818-2993 OT Time Calculation (min): 27 min  Charges: OT General Charges $OT Visit: 1 Visit OT Treatments $Self Care/Home Management : 23-37 mins  Delight Stare, [place] Pager [phone] Office [phone]    Delight Stare 04/15/2019, 11:12 AM

## 2019-04-16 ENCOUNTER — Encounter (HOSPITAL_COMMUNITY): Payer: Self-pay | Admitting: Orthopaedic Surgery

## 2019-04-16 DIAGNOSIS — Z96641 Presence of right artificial hip joint: Secondary | ICD-10-CM | POA: Diagnosis not present

## 2019-04-16 DIAGNOSIS — E1142 Type 2 diabetes mellitus with diabetic polyneuropathy: Secondary | ICD-10-CM | POA: Diagnosis not present

## 2019-04-16 DIAGNOSIS — M1990 Unspecified osteoarthritis, unspecified site: Secondary | ICD-10-CM | POA: Diagnosis not present

## 2019-04-16 DIAGNOSIS — Z794 Long term (current) use of insulin: Secondary | ICD-10-CM | POA: Diagnosis not present

## 2019-04-16 DIAGNOSIS — G894 Chronic pain syndrome: Secondary | ICD-10-CM | POA: Diagnosis not present

## 2019-04-16 DIAGNOSIS — Z471 Aftercare following joint replacement surgery: Secondary | ICD-10-CM | POA: Diagnosis not present

## 2019-04-16 DIAGNOSIS — M79604 Pain in right leg: Secondary | ICD-10-CM | POA: Diagnosis not present

## 2019-04-16 DIAGNOSIS — I2510 Atherosclerotic heart disease of native coronary artery without angina pectoris: Secondary | ICD-10-CM | POA: Diagnosis not present

## 2019-04-16 DIAGNOSIS — M24451 Recurrent dislocation, right hip: Secondary | ICD-10-CM | POA: Diagnosis not present

## 2019-04-16 NOTE — Anesthesia Postprocedure Evaluation (Signed)
Anesthesia Post Note  Patient: Keith Espinoza  Procedure(s) Performed: RIGHT TOTAL HIP REVISION-POSTERIOR  APPROACH LATERAL (Right Hip)     Patient location during evaluation: PACU Anesthesia Type: General Level of consciousness: sedated and patient cooperative Pain management: pain level controlled Vital Signs Assessment: post-procedure vital signs reviewed and stable Respiratory status: spontaneous breathing Cardiovascular status: stable Anesthetic complications: no    Last Vitals:  Vitals:   04/15/19 0341 04/15/19 0820  BP: 135/69 137/73  Pulse: 97 94  Resp: 17 18  Temp: 36.9 C 36.9 C  SpO2: 94% 98%    Last Pain:  Vitals:   04/15/19 0951  TempSrc:   PainSc: 2                  Nolon Nations

## 2019-04-18 ENCOUNTER — Telehealth: Payer: Self-pay | Admitting: Radiology

## 2019-04-18 DIAGNOSIS — G894 Chronic pain syndrome: Secondary | ICD-10-CM | POA: Diagnosis not present

## 2019-04-18 DIAGNOSIS — M24451 Recurrent dislocation, right hip: Secondary | ICD-10-CM | POA: Diagnosis not present

## 2019-04-18 DIAGNOSIS — M79604 Pain in right leg: Secondary | ICD-10-CM | POA: Diagnosis not present

## 2019-04-18 DIAGNOSIS — Z96641 Presence of right artificial hip joint: Secondary | ICD-10-CM | POA: Diagnosis not present

## 2019-04-18 DIAGNOSIS — M1990 Unspecified osteoarthritis, unspecified site: Secondary | ICD-10-CM | POA: Diagnosis not present

## 2019-04-18 DIAGNOSIS — Z471 Aftercare following joint replacement surgery: Secondary | ICD-10-CM | POA: Diagnosis not present

## 2019-04-18 LAB — AEROBIC/ANAEROBIC CULTURE W GRAM STAIN (SURGICAL/DEEP WOUND): Culture: NO GROWTH

## 2019-04-18 NOTE — Telephone Encounter (Signed)
Called to let us know that she went out to see him today, and that he took his dressing off his hip incision today when getting into the shower. She states that there was a 4x4 type dressing over the incision but it was not completely covering the incision, there was a small area at the end that was uncovered.  He told D[place] Ar that his daughter had not been able to get out and get a better dressing for it yet. She states that typically the dressing stays on till his post op appt in the office (which is scheduled for 04/27/2019). Please advise.

## 2019-04-18 NOTE — Telephone Encounter (Signed)
I called and advised the patient. 

## 2019-04-18 NOTE — Telephone Encounter (Signed)
It is fine. He can put some peroxide on the incision then put a dressing on it . thanks

## 2019-04-20 ENCOUNTER — Other Ambulatory Visit: Payer: Self-pay | Admitting: Physician Assistant

## 2019-04-20 MED ORDER — ISOSORBIDE MONONITRATE ER 30 MG PO TB24: 30.0000 mg | ORAL_TABLET | Freq: Every day | ORAL | 2 refills | Status: DC

## 2019-04-23 ENCOUNTER — Telehealth: Payer: Self-pay

## 2019-04-23 DIAGNOSIS — G894 Chronic pain syndrome: Secondary | ICD-10-CM | POA: Diagnosis not present

## 2019-04-23 DIAGNOSIS — Z471 Aftercare following joint replacement surgery: Secondary | ICD-10-CM | POA: Diagnosis not present

## 2019-04-23 DIAGNOSIS — Z96641 Presence of right artificial hip joint: Secondary | ICD-10-CM | POA: Diagnosis not present

## 2019-04-23 DIAGNOSIS — M24451 Recurrent dislocation, right hip: Secondary | ICD-10-CM | POA: Diagnosis not present

## 2019-04-23 DIAGNOSIS — M1990 Unspecified osteoarthritis, unspecified site: Secondary | ICD-10-CM | POA: Diagnosis not present

## 2019-04-23 DIAGNOSIS — M79604 Pain in right leg: Secondary | ICD-10-CM | POA: Diagnosis not present

## 2019-04-23 NOTE — Telephone Encounter (Signed)
Please advise 

## 2019-04-23 NOTE — Telephone Encounter (Signed)
[name] with [facility] called stating that patient's bandage over his right hip incision is saturated in blood.  Stated that he has some swelling and some soreness, but no pain and patient is walking okay.Right total hip surgery Friday, 04/13/2019.  Patient has an appointment with Dr. Lorin Mercy on Friday, 04/27/2019.  [name]'s CB# is [phone].  Patient's CB# is [phone].  Please advise.  Thank you.

## 2019-04-23 NOTE — Telephone Encounter (Signed)
Ok to change dressing thanks

## 2019-04-24 ENCOUNTER — Ambulatory Visit (INDEPENDENT_AMBULATORY_CARE_PROVIDER_SITE_OTHER): Payer: Medicare Other | Admitting: Orthopaedic Surgery

## 2019-04-24 ENCOUNTER — Ambulatory Visit (INDEPENDENT_AMBULATORY_CARE_PROVIDER_SITE_OTHER): Payer: Medicare Other

## 2019-04-24 ENCOUNTER — Telehealth: Payer: Self-pay | Admitting: Orthopaedic Surgery

## 2019-04-24 ENCOUNTER — Other Ambulatory Visit: Payer: Self-pay

## 2019-04-24 ENCOUNTER — Encounter: Payer: Self-pay | Admitting: Orthopaedic Surgery

## 2019-04-24 VITALS — Ht 70.0 in | Wt 196.0 lb

## 2019-04-24 DIAGNOSIS — M25551 Pain in right hip: Secondary | ICD-10-CM

## 2019-04-24 NOTE — Telephone Encounter (Signed)
Patient needs a long patch to put on incision. 4x4 is not big enough.  Call patient to see if they can come and pick up the long patch.  [phone]

## 2019-04-24 NOTE — Telephone Encounter (Signed)
I called and spoke with patient. Large bandage placed at front for him to come and pick up. He states that he will be here around 3:30p.

## 2019-04-24 NOTE — Telephone Encounter (Signed)
I called Bri and patient and advised.

## 2019-04-24 NOTE — Progress Notes (Signed)
Post-Op Visit Note   Patient: Keith Espinoza           Date of Birth: 1941/02/11           MRN: [id number] Visit Date: 04/24/2019 PCP: [name], MD   Assessment & Plan: Patient returns early due to a bleeding from his hip incision where he had a hip revision he has multiple staples he has had to change it a couple times a day with some dark bloody drainage.  He has no fever no chills no cellulitis.  He had a therapist that came out and had a walk around some we will stop his therapy he is not been very active but will have him slow down even more and be less mobile to allow the hip a chance to seal over.  We discussed potential for infection if it continues to drain.  I plan to recheck him in 2 weeks. Patient's had 6 heart stents and is on Plavix and aspirin which undoubtedly is contributing to the postop hematoma that he has plus the multiple hip dislocations with a pocket that is present and developed after the recurrent dislocations.  With less  activity I discussed with him that has his best chance of sealing off. Chief Complaint:  Chief Complaint  Patient presents with  . Right Hip - Routine Post Op    04/13/2019 Right Total hip revision, posterior approach   Visit Diagnoses:  1. Pain in right hip   2.     Postop hip revision hematoma with some bloody drainage.  Plan: We will recheck him in 2 weeks if he develops any fever or chills he will call us promptly.  We will stop his therapy he will be less active.  Recheck 2 weeks.  Follow-Up Instructions: No follow-ups on file.   Orders:  Orders Placed This Encounter  Procedures  . XR HIP UNILAT W OR W/O PELVIS 2-3 VIEWS RIGHT   No orders of the defined types were placed in this encounter.   Imaging: No results found.  PMFS History: Patient Active Problem List   Diagnosis Date Noted  . Hip dislocation, right ([place]) 04/13/2019  . Other intervertebral disc degeneration, lumbar region 03/30/2019  . CAD (coronary artery  disease) 01/30/2019  . Tobacco abuse 01/30/2019  . 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  . Burn, foot, second degree, left, initial encounter 06/08/2017  . 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 S/P total knee arthroplasty, left 10/26/2016  . Hyperlipidemia 09/04/2014  . Thrombocytopenia ([place])   . Precordial chest pain 04/05/2014  . Coronary atherosclerosis of native coronary artery 10/01/2013  . Other and unspecified hyperlipidemia 10/01/2013  . Essential hypertension, benign 10/01/2013  . Insulin dependent type 2 diabetes mellitus ([place]) 10/01/2013  . Esophageal reflux 10/01/2013  . Hypertrophy of prostate without urinary obstruction and other lower urinary tract symptoms (LUTS) 10/01/2013   Past Medical History:  Diagnosis Date  . Allergic rhinitis   . Allergic rhinitis   . Arthritis   . Chronic leg pain    right  . Chronic lower back pain   . Coronary artery disease    a. Stenting to RCA 2004; staged DES to LAD and Cx 2004. DES to mRCA 2012. b. DES to mCx, PTCA to dCx 11/2011. c. Lateral wall MI 2013 s/p PTCA to distal Cx & DES to mid OM2 11/2011. d. Low risk nuc 04/2014, EF wnl.  . Diabetes mellitus    Insulin dependent  .  Diabetic neuropathy (HCC)    MILD  . Diverticulosis   . Dysrhythmia   . Gilbert syndrome   . Gout    right wrist; right foot; right elbow; have had it since 1970's  . H/O hiatal hernia   . Heart murmur   . History of echocardiogram    aortic sclerosis per echo 12/09 EF 65%, otherwise normal  . History of hemorrhoids    BLEEDING  . History of kidney stones    h/o  . Hypertension    Diagnosed 1995   . Pancreatic pseudocyst    a. s/p remote drainage 2006.  Marland Kitchen Thrombocytopenia ([place])    Seen on oldest labs in system from 2004  . Vitamin B 12 deficiency    orally replaced    Family History  Problem Relation Age of Onset  . Diabetes Mother   . Hyperlipidemia Mother   . Hypertension Mother   . Cancer Father   .  Hypertension Father   . Diabetes Sister   . Hypertension Sister   . Cancer Brother   . Heart attack Neg Hx     Past Surgical History:  Procedure Laterality Date  . BACK SURGERY     "total of 3 times" S/P fall   . CARPAL TUNNEL RELEASE Bilateral   . CHOLECYSTECTOMY  1990's  . COLONOSCOPY    . CORONARY ANGIOPLASTY  11/11/11  . CORONARY ANGIOPLASTY WITH STENT PLACEMENT  09/30/2011   "1 then; makes a total of 4"  . CORONARY ANGIOPLASTY WITH STENT PLACEMENT  11/11/11   "1; makes a total of 5"  . INGUINAL HERNIA REPAIR  2003   right  . JOINT REPLACEMENT Right 04/03/2002   hip replacment  . KNEE ARTHROSCOPY  1990's   left  . LEFT HEART CATHETERIZATION WITH CORONARY ANGIOGRAM N/A 09/30/2011   Procedure: LEFT HEART CATHETERIZATION WITH CORONARY ANGIOGRAM;  Surgeon: Jettie Booze, MD;  Location: [facility] CATH LAB;  Service: Cardiovascular;  Laterality: N/A;  possible PCI  . LEFT HEART CATHETERIZATION WITH CORONARY ANGIOGRAM N/A 11/15/2011   Procedure: LEFT HEART CATHETERIZATION WITH CORONARY ANGIOGRAM;  Surgeon: Jettie Booze, MD;  Location: [facility] CATH LAB;  Service: Cardiovascular;  Laterality: N/A;  . PERCUTANEOUS CORONARY STENT INTERVENTION (PCI-S)  09/30/2011   Procedure: PERCUTANEOUS CORONARY STENT INTERVENTION (PCI-S);  Surgeon: Jettie Booze, MD;  Location: [facility] CATH LAB;  Service: Cardiovascular;;  . PERCUTANEOUS CORONARY STENT INTERVENTION (PCI-S) N/A 11/11/2011   Procedure: PERCUTANEOUS CORONARY STENT INTERVENTION (PCI-S);  Surgeon: Jettie Booze, MD;  Location: [facility] CATH LAB;  Service: Cardiovascular;  Laterality: N/A;  . SHOULDER SURGERY Right    X 2  . TONSILLECTOMY  ~ 1948  . TOTAL HIP REVISION Right 04/13/2019   Procedure: RIGHT TOTAL HIP REVISION-POSTERIOR  APPROACH LATERAL;  Surgeon: Marybelle Killings, MD;  Location: [place];  Service: Orthopedics;  Laterality: Right;  . TOTAL KNEE ARTHROPLASTY Left 07/23/2016   Procedure: LEFT TOTAL KNEE ARTHROPLASTY;  Surgeon: Marybelle Killings,  MD;  Location: [place];  Service: Orthopedics;  Laterality: Left;   Social History   Occupational History  . Occupation: Retired  Tobacco Use  . Smoking status: Former Research scientist (life sciences)  . Smokeless tobacco: Current User    Types: Chew  Substance and Sexual Activity  . Alcohol use: No  . Drug use: No  . Sexual activity: Not Currently

## 2019-04-25 ENCOUNTER — Telehealth: Payer: Self-pay | Admitting: Orthopaedic Surgery

## 2019-04-25 NOTE — Telephone Encounter (Signed)
I called Stacy and advised, stop PT per office note from yesterday. She will discharge patient and await further orders if Dr. Lorin Mercy decides to resume therapy.

## 2019-04-25 NOTE — Telephone Encounter (Signed)
Received voicemail message from Stacy-(PT) with [facility] needing clarification on (PT) orders. The number to contact Marzetta Board is [phone]

## 2019-04-27 ENCOUNTER — Inpatient Hospital Stay: Payer: Medicare Other | Admitting: Orthopaedic Surgery

## 2019-05-01 ENCOUNTER — Encounter: Payer: Self-pay | Admitting: Orthopaedic Surgery

## 2019-05-01 ENCOUNTER — Ambulatory Visit (INDEPENDENT_AMBULATORY_CARE_PROVIDER_SITE_OTHER): Payer: Medicare Other | Admitting: Orthopaedic Surgery

## 2019-05-01 DIAGNOSIS — Z96649 Presence of unspecified artificial hip joint: Secondary | ICD-10-CM

## 2019-05-01 HISTORY — DX: Presence of unspecified artificial hip joint: Z96.649

## 2019-05-01 NOTE — Progress Notes (Signed)
Post right total hip revision after many years for recurrent dislocation 17 years after his original procedure.  He has had problems with the hematoma is had some bloody drainage.small 1cm spot of blood on inside of dressing. His daughter has changed his dressing.  Return one week for possible staple removal.  He has subQ hematoma and is on plavix and Aspirin. He will call if he has increased drainage.

## 2019-05-08 ENCOUNTER — Encounter: Payer: Self-pay | Admitting: Orthopaedic Surgery

## 2019-05-08 ENCOUNTER — Ambulatory Visit (INDEPENDENT_AMBULATORY_CARE_PROVIDER_SITE_OTHER): Payer: Medicare Other | Admitting: Orthopaedic Surgery

## 2019-05-08 ENCOUNTER — Other Ambulatory Visit: Payer: Self-pay

## 2019-05-08 VITALS — BP 135/67 | HR 72 | Ht 70.0 in | Wt 196.0 lb

## 2019-05-08 DIAGNOSIS — Z96649 Presence of unspecified artificial hip joint: Secondary | ICD-10-CM

## 2019-05-08 NOTE — Progress Notes (Signed)
Post-Op Visit Note   Patient: Keith Espinoza           Date of Birth: Feb 21, 1941           MRN: [id number] Visit Date: 05/08/2019 PCP: [name], MD   Assessment & Plan:staples removed. Incision looks good. subQ hematoma resolving no drainage.   Chief Complaint:  Chief Complaint  Patient presents with  . Follow-up   Visit Diagnoses:  1. S/P revision of total hip     Plan: Continue ambulation with his cane.  He can return as needed.  He has noted some slight noise in his hip which is likely the restrained liner.  I reviewed the previous x-rays with him and he can continue ambulation.  We reviewed activities he should avoid such as climbing on roofs, on top of tractors etc.  Follow-Up Instructions: No follow-ups on file.   Orders:  No orders of the defined types were placed in this encounter.  No orders of the defined types were placed in this encounter.   Imaging: No results found.  PMFS History: Patient Active Problem List   Diagnosis Date Noted  . 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/2019  . Hip dislocation, right ([place]) 04/13/2019  . Other intervertebral disc degeneration, lumbar region 03/30/2019  . CAD (coronary artery disease) 01/30/2019  . Tobacco abuse 01/30/2019  . 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  . Burn, foot, second degree, left, initial encounter 06/08/2017  . 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 S/P total knee arthroplasty, left 10/26/2016  . Hyperlipidemia 09/04/2014  . Thrombocytopenia ([place])   . Precordial chest pain 04/05/2014  . Coronary atherosclerosis of native coronary artery 10/01/2013  . Other and unspecified hyperlipidemia 10/01/2013  . Essential hypertension, benign 10/01/2013  . Insulin dependent type 2 diabetes mellitus ([place]) 10/01/2013  . Esophageal reflux 10/01/2013  . Hypertrophy of prostate without urinary obstruction and other lower urinary tract symptoms (LUTS) 10/01/2013   Past Medical  History:  Diagnosis Date  . Allergic rhinitis   . Allergic rhinitis   . Arthritis   . Chronic leg pain    right  . Chronic lower back pain   . Coronary artery disease    a. Stenting to RCA 2004; staged DES to LAD and Cx 2004. DES to mRCA 2012. b. DES to mCx, PTCA to dCx 11/2011. c. Lateral wall MI 2013 s/p PTCA to distal Cx & DES to mid OM2 11/2011. d. Low risk nuc 04/2014, EF wnl.  . Diabetes mellitus    Insulin dependent  . Diabetic neuropathy (HCC)    MILD  . Diverticulosis   . Dysrhythmia   . Gilbert syndrome   . Gout    right wrist; right foot; right elbow; have had it since 1970's  . H/O hiatal hernia   . Heart murmur   . History of echocardiogram    aortic sclerosis per echo 12/09 EF 65%, otherwise normal  . History of hemorrhoids    BLEEDING  . History of kidney stones    h/o  . Hypertension    Diagnosed 1995   . Pancreatic pseudocyst    a. s/p remote drainage 2006.  Marland Kitchen Thrombocytopenia ([place])    Seen on oldest labs in system from 2004  . Vitamin B 12 deficiency    orally replaced    Family History  Problem Relation Age of Onset  . Diabetes Mother   . Hyperlipidemia Mother   . Hypertension Mother   . Cancer  Father   . Hypertension Father   . Diabetes Sister   . Hypertension Sister   . Cancer Brother   . Heart attack Neg Hx     Past Surgical History:  Procedure Laterality Date  . BACK SURGERY     "total of 3 times" S/P fall   . CARPAL TUNNEL RELEASE Bilateral   . CHOLECYSTECTOMY  1990's  . COLONOSCOPY    . CORONARY ANGIOPLASTY  11/11/11  . CORONARY ANGIOPLASTY WITH STENT PLACEMENT  09/30/2011   "1 then; makes a total of 4"  . CORONARY ANGIOPLASTY WITH STENT PLACEMENT  11/11/11   "1; makes a total of 5"  . INGUINAL HERNIA REPAIR  2003   right  . JOINT REPLACEMENT Right 04/03/2002   hip replacment  . KNEE ARTHROSCOPY  1990's   left  . LEFT HEART CATHETERIZATION WITH CORONARY ANGIOGRAM N/A 09/30/2011   Procedure: LEFT HEART CATHETERIZATION WITH CORONARY  ANGIOGRAM;  Surgeon: Jettie Booze, MD;  Location: [facility] CATH LAB;  Service: Cardiovascular;  Laterality: N/A;  possible PCI  . LEFT HEART CATHETERIZATION WITH CORONARY ANGIOGRAM N/A 11/15/2011   Procedure: LEFT HEART CATHETERIZATION WITH CORONARY ANGIOGRAM;  Surgeon: Jettie Booze, MD;  Location: [facility] CATH LAB;  Service: Cardiovascular;  Laterality: N/A;  . PERCUTANEOUS CORONARY STENT INTERVENTION (PCI-S)  09/30/2011   Procedure: PERCUTANEOUS CORONARY STENT INTERVENTION (PCI-S);  Surgeon: Jettie Booze, MD;  Location: [facility] CATH LAB;  Service: Cardiovascular;;  . PERCUTANEOUS CORONARY STENT INTERVENTION (PCI-S) N/A 11/11/2011   Procedure: PERCUTANEOUS CORONARY STENT INTERVENTION (PCI-S);  Surgeon: Jettie Booze, MD;  Location: [facility] CATH LAB;  Service: Cardiovascular;  Laterality: N/A;  . SHOULDER SURGERY Right    X 2  . TONSILLECTOMY  ~ 1948  . TOTAL HIP REVISION Right 04/13/2019   Procedure: RIGHT TOTAL HIP REVISION-POSTERIOR  APPROACH LATERAL;  Surgeon: Marybelle Killings, MD;  Location: [place];  Service: Orthopedics;  Laterality: Right;  . TOTAL KNEE ARTHROPLASTY Left 07/23/2016   Procedure: LEFT TOTAL KNEE ARTHROPLASTY;  Surgeon: Marybelle Killings, MD;  Location: [place];  Service: Orthopedics;  Laterality: Left;   Social History   Occupational History  . Occupation: Retired  Tobacco Use  . Smoking status: Former Research scientist (life sciences)  . Smokeless tobacco: Current User    Types: Chew  Substance and Sexual Activity  . Alcohol use: No  . Drug use: No  . Sexual activity: Not Currently

## 2019-05-16 ENCOUNTER — Telehealth: Payer: Self-pay | Admitting: Orthopaedic Surgery

## 2019-05-16 NOTE — Telephone Encounter (Signed)
Patient called asked if the popping in his hip normal? Patient said he had surgery in July. Patient said he is afraid the hip will give way and he will fall. The number to contact patient is [phone]

## 2019-05-16 NOTE — Telephone Encounter (Signed)
Please advise 

## 2019-05-17 NOTE — Telephone Encounter (Signed)
I called and discussed.

## 2019-06-29 DIAGNOSIS — E7849 Other hyperlipidemia: Secondary | ICD-10-CM | POA: Diagnosis not present

## 2019-06-29 DIAGNOSIS — E785 Hyperlipidemia, unspecified: Secondary | ICD-10-CM | POA: Diagnosis not present

## 2019-06-29 DIAGNOSIS — N400 Enlarged prostate without lower urinary tract symptoms: Secondary | ICD-10-CM | POA: Diagnosis not present

## 2019-06-29 DIAGNOSIS — E1165 Type 2 diabetes mellitus with hyperglycemia: Secondary | ICD-10-CM | POA: Diagnosis not present

## 2019-06-29 DIAGNOSIS — E1342 Other specified diabetes mellitus with diabetic polyneuropathy: Secondary | ICD-10-CM | POA: Diagnosis not present

## 2019-06-29 DIAGNOSIS — I25118 Atherosclerotic heart disease of native coronary artery with other forms of angina pectoris: Secondary | ICD-10-CM | POA: Diagnosis not present

## 2019-06-29 DIAGNOSIS — E1140 Type 2 diabetes mellitus with diabetic neuropathy, unspecified: Secondary | ICD-10-CM | POA: Diagnosis not present

## 2019-06-29 DIAGNOSIS — I10 Essential (primary) hypertension: Secondary | ICD-10-CM | POA: Diagnosis not present

## 2019-07-03 DIAGNOSIS — Z23 Encounter for immunization: Secondary | ICD-10-CM | POA: Diagnosis not present

## 2019-08-16 DIAGNOSIS — N400 Enlarged prostate without lower urinary tract symptoms: Secondary | ICD-10-CM | POA: Diagnosis not present

## 2019-08-16 DIAGNOSIS — I10 Essential (primary) hypertension: Secondary | ICD-10-CM | POA: Diagnosis not present

## 2019-08-16 DIAGNOSIS — I25118 Atherosclerotic heart disease of native coronary artery with other forms of angina pectoris: Secondary | ICD-10-CM | POA: Diagnosis not present

## 2019-08-16 DIAGNOSIS — E785 Hyperlipidemia, unspecified: Secondary | ICD-10-CM | POA: Diagnosis not present

## 2019-08-16 DIAGNOSIS — E1342 Other specified diabetes mellitus with diabetic polyneuropathy: Secondary | ICD-10-CM | POA: Diagnosis not present

## 2019-08-16 DIAGNOSIS — E7849 Other hyperlipidemia: Secondary | ICD-10-CM | POA: Diagnosis not present

## 2019-08-16 DIAGNOSIS — E1140 Type 2 diabetes mellitus with diabetic neuropathy, unspecified: Secondary | ICD-10-CM | POA: Diagnosis not present

## 2019-08-16 DIAGNOSIS — E1165 Type 2 diabetes mellitus with hyperglycemia: Secondary | ICD-10-CM | POA: Diagnosis not present

## 2019-08-21 ENCOUNTER — Telehealth: Payer: Self-pay

## 2019-10-12 ENCOUNTER — Other Ambulatory Visit: Payer: Self-pay | Admitting: *Deleted

## 2019-10-12 NOTE — Patient Outreach (Signed)
[facility]) Care Management  [place]  10/12/2019   Keith Espinoza 08-May-1941 [id number]  RN Health Coach telephone call to patient.  Hipaa compliance verified. Per patient he checks his blood sugars at night. His last blood sugar was 166. Per patient he sometimes has episodes of his blood sugar dropping. Patient stated that his blood sugar has dropped as low as 40 in the morning. Patient stated that he sweats sometimes but mostly he becomes very confused. Per patient his wife and him do not cook. He usually gets meals out for them. Patient stated they do not received meals on wheels and he has no interest in getting them. Per patient he is able to afford his medications. Patient stated that his appetite is good. Patient stated that he uses a cane sometimes when he first gets up and then he doesn't use it anymore the rest of the day. He is the primary caregiver for his wife who is on dialysis. Patient does have an advance directive. Patient has agreed to follow up outreach calls.   Encounter Medications:  Outpatient Encounter Medications as of 10/12/2019  Medication Sig Note  . allopurinol (ZYLOPRIM) 300 MG tablet Take 300 mg by mouth daily.    Marland Kitchen amLODipine (NORVASC) 2.5 MG tablet Take 2.5 mg by mouth daily.   Marland Kitchen aspirin EC 81 MG tablet Take 81 mg by mouth daily.   . Cholecalciferol (VITAMIN D3) 50 MCG (2000 UT) TABS Take 2,000 Units by mouth daily.    . clopidogrel (PLAVIX) 75 MG tablet TAKE ONE TABLET BY MOUTH ONE TIME DAILY (Patient taking differently: Take 75 mg by mouth daily. )   . gabapentin (NEURONTIN) 300 MG capsule Take 600 mg by mouth 2 (two) times daily.    Marland Kitchen glimepiride (AMARYL) 4 MG tablet Take 4 mg by mouth 2 (two) times daily.    . hydrochlorothiazide (MICROZIDE) 12.5 MG capsule Take 12.5 mg by mouth daily.    Marland Kitchen HYDROcodone-acetaminophen (NORCO) 5-325 MG tablet Take 1 tablet by mouth every 4 (four) hours as needed for moderate pain. Post hip revision pain  (Patient not taking: Reported on 04/24/2019)   . insulin detemir (LEVEMIR) 100 UNIT/ML injection Inject 12-30.5 Units into the skin at bedtime.  04/13/2019: Pt. Took 20 units @ 2200  . isosorbide mononitrate (IMDUR) 30 MG 24 hr tablet Take 1 tablet (30 mg total) by mouth daily.   Marland Kitchen KLOR-CON M20 20 MEQ tablet Take 20 mEq by mouth daily.    Marland Kitchen losartan (COZAAR) 50 MG tablet Take 50 mg by mouth daily.   . metFORMIN (GLUCOPHAGE) 1000 MG tablet Take 1 tablet (1,000 mg total) by mouth 2 (two) times daily.   . nitroGLYCERIN (NITROSTAT) 0.4 MG SL tablet Place 0.4 mg under the tongue every 5 (five) minutes as needed for chest pain.   . ONE TOUCH ULTRA TEST test strip CHECK BLOOD SUGAR ONCE DAILY AS DIRECTED   . rosuvastatin (CRESTOR) 10 MG tablet Take 1 tablet (10 mg total) by mouth once a week.   . terazosin (HYTRIN) 5 MG capsule Take 5 mg by mouth at bedtime.     No facility-administered encounter medications on file as of 10/12/2019.    Functional Status:  In your present state of health, do you have any difficulty performing the following activities: 10/12/2019 04/11/2019  Hearing? N N  Vision? N N  Difficulty concentrating or making decisions? N N  Walking or climbing stairs? Tempie Donning  Comment Patient is having  popping and clicking in hip. Per patient it causes him to hop when he walks -  Dressing or bathing? N Y  Doing errands, shopping? N Y  Conservation officer, nature and eating ? Y -  Comment Patient stated they eat out mostly he doesn't cook -  Using the Toilet? N -  In the past six months, have you accidently leaked urine? N -  Do you have problems with loss of bowel control? N -  Managing your Medications? N -  Managing your Finances? N -  Housekeeping or managing your Housekeeping? Y -  Comment Daughter helps -  Some recent data might be hidden    Fall/Depression Screening: Fall Risk  10/12/2019  Falls in the past year? 0  Number falls in past yr: 0  Injury with Fall? 0  Risk for fall due to :  Impaired balance/gait;Impaired mobility  Follow up Falls evaluation completed;Falls prevention discussed;Education provided   PHQ 2/9 Scores 10/12/2019  PHQ - 2 Score 0   THN CM Care Plan Problem One     Most Recent Value  Care Plan Problem One  Knowledge Deficit in Self Maintenace of Diabetes  Role Documenting the Problem One  [place] for Problem One  Active  THN Long Term Goal   Patient will not have any hypoglycemic reactions within the next 90 days  THN Long Term Goal Start Date  10/12/19  Interventions for Problem One Long Term Goal  RN discussed the hypoglycemic reactions the patient is having. RN talked about HS snacks. RN sent a picture sheet of faces of blood sugars high and low. Rn will follow up with further discussion  THN CM Short Term Goal #1   Patient will verbalize checking and documenting blood sugars within the next 30 days  THN CM Short Term Goal #1 Start Date  10/12/19  Interventions for Short Term Goal #1  RN discussed checking blood sugars. RN sent a calendar book for patient to use for documenting and take to physician. RN will follow upwith further discussion  THN CM Short Term Goal #2   Patient will verbalize receiving educational material on making better choices eating out within the next 30 days  THN CM Short Term Goal #2 Start Date  10/12/19  Interventions for Short Term Goal #2  RN discussed eating out since the patient and wife get their meal from outside dining. RN sent a list of diabetic foods from diabetic menu. RN will follow upwith further discussion  THN CM Short Term Goal #3  Patient will verbalize choosing healthier snacks.  THN CM Short Term Goal #3 Start Date  10/12/19  Interventions for Short Tern Goal #3  RN discussed with patient about choosing healthier snacks and what snacks he usually ate. RN sent a list of healthy low carb snacks for patient to choose. RN will follow up for further discussion      Assessment:  A1C 6.6 Patient  checks his blood sugars at HS Per patient he eats out mostly. Patient and  his wife do not cook Patient can afford medication Patient has episodes of hypoglycemia Patient will benefit from [place] telephonic outreach for education and support for diabetes self management.  Plan:  RN discussed patient  hypoglycemia symptoms RN sent patient a picture sheet of hypo and hyperglycemia symptoms and action plan RN discussed choosing foods at take out facilities RN sent a pictures sheet of recommended diabetic food choices from take out menus RN discussed  low carb snacks RN sent a list of healthy low carb foods  RN discussed monitoring blood sugars and documenting RN sent a 2021 calendar book for patient for documentation RN sent a welcome packet RN sent Living well with diabetes booklet RN sent a barrier letter and assessment to physician RN will follow up within the month of April  Treana Lacour [facility] [phone]

## 2019-10-19 DIAGNOSIS — I25118 Atherosclerotic heart disease of native coronary artery with other forms of angina pectoris: Secondary | ICD-10-CM | POA: Diagnosis not present

## 2019-10-19 DIAGNOSIS — E1140 Type 2 diabetes mellitus with diabetic neuropathy, unspecified: Secondary | ICD-10-CM | POA: Diagnosis not present

## 2019-10-19 DIAGNOSIS — E1165 Type 2 diabetes mellitus with hyperglycemia: Secondary | ICD-10-CM | POA: Diagnosis not present

## 2019-10-19 DIAGNOSIS — E785 Hyperlipidemia, unspecified: Secondary | ICD-10-CM | POA: Diagnosis not present

## 2019-10-19 DIAGNOSIS — E1342 Other specified diabetes mellitus with diabetic polyneuropathy: Secondary | ICD-10-CM | POA: Diagnosis not present

## 2019-10-19 DIAGNOSIS — I10 Essential (primary) hypertension: Secondary | ICD-10-CM | POA: Diagnosis not present

## 2019-10-19 DIAGNOSIS — E7849 Other hyperlipidemia: Secondary | ICD-10-CM | POA: Diagnosis not present

## 2019-10-19 DIAGNOSIS — N400 Enlarged prostate without lower urinary tract symptoms: Secondary | ICD-10-CM | POA: Diagnosis not present

## 2019-10-22 DIAGNOSIS — E1165 Type 2 diabetes mellitus with hyperglycemia: Secondary | ICD-10-CM | POA: Diagnosis not present

## 2019-10-22 DIAGNOSIS — I25118 Atherosclerotic heart disease of native coronary artery with other forms of angina pectoris: Secondary | ICD-10-CM | POA: Diagnosis not present

## 2019-10-22 DIAGNOSIS — E785 Hyperlipidemia, unspecified: Secondary | ICD-10-CM | POA: Diagnosis not present

## 2019-10-22 DIAGNOSIS — N400 Enlarged prostate without lower urinary tract symptoms: Secondary | ICD-10-CM | POA: Diagnosis not present

## 2019-10-22 DIAGNOSIS — M109 Gout, unspecified: Secondary | ICD-10-CM | POA: Diagnosis not present

## 2019-10-22 DIAGNOSIS — I10 Essential (primary) hypertension: Secondary | ICD-10-CM | POA: Diagnosis not present

## 2019-10-22 DIAGNOSIS — E1342 Other specified diabetes mellitus with diabetic polyneuropathy: Secondary | ICD-10-CM | POA: Diagnosis not present

## 2019-10-22 DIAGNOSIS — K219 Gastro-esophageal reflux disease without esophagitis: Secondary | ICD-10-CM | POA: Diagnosis not present

## 2019-10-31 ENCOUNTER — Other Ambulatory Visit: Payer: Self-pay | Admitting: Physician Assistant

## 2019-11-01 ENCOUNTER — Other Ambulatory Visit: Payer: Self-pay

## 2019-11-01 DIAGNOSIS — I10 Essential (primary) hypertension: Secondary | ICD-10-CM | POA: Diagnosis not present

## 2019-11-01 DIAGNOSIS — E785 Hyperlipidemia, unspecified: Secondary | ICD-10-CM | POA: Diagnosis not present

## 2019-11-01 DIAGNOSIS — I2510 Atherosclerotic heart disease of native coronary artery without angina pectoris: Secondary | ICD-10-CM | POA: Diagnosis not present

## 2019-11-01 DIAGNOSIS — E1165 Type 2 diabetes mellitus with hyperglycemia: Secondary | ICD-10-CM | POA: Diagnosis not present

## 2019-11-01 DIAGNOSIS — C4491 Basal cell carcinoma of skin, unspecified: Secondary | ICD-10-CM

## 2019-11-01 DIAGNOSIS — Z7189 Other specified counseling: Secondary | ICD-10-CM | POA: Diagnosis not present

## 2019-11-01 DIAGNOSIS — L570 Actinic keratosis: Secondary | ICD-10-CM | POA: Diagnosis not present

## 2019-11-01 DIAGNOSIS — Z79899 Other long term (current) drug therapy: Secondary | ICD-10-CM | POA: Diagnosis not present

## 2019-11-01 DIAGNOSIS — E1140 Type 2 diabetes mellitus with diabetic neuropathy, unspecified: Secondary | ICD-10-CM | POA: Diagnosis not present

## 2019-11-01 DIAGNOSIS — Z794 Long term (current) use of insulin: Secondary | ICD-10-CM | POA: Diagnosis not present

## 2019-11-01 DIAGNOSIS — E669 Obesity, unspecified: Secondary | ICD-10-CM | POA: Diagnosis not present

## 2019-11-01 DIAGNOSIS — C44519 Basal cell carcinoma of skin of other part of trunk: Secondary | ICD-10-CM | POA: Diagnosis not present

## 2019-11-01 HISTORY — DX: Basal cell carcinoma of skin, unspecified: C44.91

## 2019-11-08 ENCOUNTER — Other Ambulatory Visit: Payer: Self-pay | Admitting: Physician Assistant

## 2019-11-13 DIAGNOSIS — E1140 Type 2 diabetes mellitus with diabetic neuropathy, unspecified: Secondary | ICD-10-CM | POA: Diagnosis not present

## 2019-11-13 DIAGNOSIS — I2510 Atherosclerotic heart disease of native coronary artery without angina pectoris: Secondary | ICD-10-CM | POA: Diagnosis not present

## 2019-11-13 DIAGNOSIS — Z7189 Other specified counseling: Secondary | ICD-10-CM | POA: Diagnosis not present

## 2019-11-13 DIAGNOSIS — E785 Hyperlipidemia, unspecified: Secondary | ICD-10-CM | POA: Diagnosis not present

## 2019-11-13 DIAGNOSIS — E669 Obesity, unspecified: Secondary | ICD-10-CM | POA: Diagnosis not present

## 2019-11-13 DIAGNOSIS — E1165 Type 2 diabetes mellitus with hyperglycemia: Secondary | ICD-10-CM | POA: Diagnosis not present

## 2019-11-13 DIAGNOSIS — Z794 Long term (current) use of insulin: Secondary | ICD-10-CM | POA: Diagnosis not present

## 2019-11-13 DIAGNOSIS — I10 Essential (primary) hypertension: Secondary | ICD-10-CM | POA: Diagnosis not present

## 2019-11-19 DIAGNOSIS — I25118 Atherosclerotic heart disease of native coronary artery with other forms of angina pectoris: Secondary | ICD-10-CM | POA: Diagnosis not present

## 2019-11-19 DIAGNOSIS — K219 Gastro-esophageal reflux disease without esophagitis: Secondary | ICD-10-CM | POA: Diagnosis not present

## 2019-11-19 DIAGNOSIS — M109 Gout, unspecified: Secondary | ICD-10-CM | POA: Diagnosis not present

## 2019-11-19 DIAGNOSIS — E559 Vitamin D deficiency, unspecified: Secondary | ICD-10-CM | POA: Diagnosis not present

## 2019-11-19 DIAGNOSIS — I10 Essential (primary) hypertension: Secondary | ICD-10-CM | POA: Diagnosis not present

## 2019-11-19 DIAGNOSIS — Z0001 Encounter for general adult medical examination with abnormal findings: Secondary | ICD-10-CM | POA: Diagnosis not present

## 2019-11-19 DIAGNOSIS — E1342 Other specified diabetes mellitus with diabetic polyneuropathy: Secondary | ICD-10-CM | POA: Diagnosis not present

## 2019-11-19 DIAGNOSIS — N400 Enlarged prostate without lower urinary tract symptoms: Secondary | ICD-10-CM | POA: Diagnosis not present

## 2019-11-19 DIAGNOSIS — E785 Hyperlipidemia, unspecified: Secondary | ICD-10-CM | POA: Diagnosis not present

## 2019-11-19 DIAGNOSIS — E1165 Type 2 diabetes mellitus with hyperglycemia: Secondary | ICD-10-CM | POA: Diagnosis not present

## 2019-11-28 DIAGNOSIS — I10 Essential (primary) hypertension: Secondary | ICD-10-CM | POA: Diagnosis not present

## 2019-11-28 DIAGNOSIS — E1165 Type 2 diabetes mellitus with hyperglycemia: Secondary | ICD-10-CM | POA: Diagnosis not present

## 2019-11-28 DIAGNOSIS — N400 Enlarged prostate without lower urinary tract symptoms: Secondary | ICD-10-CM | POA: Diagnosis not present

## 2019-11-28 DIAGNOSIS — I25118 Atherosclerotic heart disease of native coronary artery with other forms of angina pectoris: Secondary | ICD-10-CM | POA: Diagnosis not present

## 2019-11-28 DIAGNOSIS — E7849 Other hyperlipidemia: Secondary | ICD-10-CM | POA: Diagnosis not present

## 2019-11-28 DIAGNOSIS — E1140 Type 2 diabetes mellitus with diabetic neuropathy, unspecified: Secondary | ICD-10-CM | POA: Diagnosis not present

## 2019-11-28 DIAGNOSIS — R630 Anorexia: Secondary | ICD-10-CM | POA: Diagnosis not present

## 2019-11-28 DIAGNOSIS — E1342 Other specified diabetes mellitus with diabetic polyneuropathy: Secondary | ICD-10-CM | POA: Diagnosis not present

## 2019-12-25 DIAGNOSIS — I2510 Atherosclerotic heart disease of native coronary artery without angina pectoris: Secondary | ICD-10-CM | POA: Diagnosis not present

## 2019-12-25 DIAGNOSIS — I10 Essential (primary) hypertension: Secondary | ICD-10-CM | POA: Diagnosis not present

## 2019-12-25 DIAGNOSIS — Z7189 Other specified counseling: Secondary | ICD-10-CM | POA: Diagnosis not present

## 2019-12-25 DIAGNOSIS — Z794 Long term (current) use of insulin: Secondary | ICD-10-CM | POA: Diagnosis not present

## 2019-12-25 DIAGNOSIS — E785 Hyperlipidemia, unspecified: Secondary | ICD-10-CM | POA: Diagnosis not present

## 2019-12-25 DIAGNOSIS — E669 Obesity, unspecified: Secondary | ICD-10-CM | POA: Diagnosis not present

## 2019-12-25 DIAGNOSIS — E1165 Type 2 diabetes mellitus with hyperglycemia: Secondary | ICD-10-CM | POA: Diagnosis not present

## 2019-12-25 DIAGNOSIS — E1140 Type 2 diabetes mellitus with diabetic neuropathy, unspecified: Secondary | ICD-10-CM | POA: Diagnosis not present

## 2020-01-02 DIAGNOSIS — E7849 Other hyperlipidemia: Secondary | ICD-10-CM | POA: Diagnosis not present

## 2020-01-02 DIAGNOSIS — E1165 Type 2 diabetes mellitus with hyperglycemia: Secondary | ICD-10-CM | POA: Diagnosis not present

## 2020-01-02 DIAGNOSIS — E1140 Type 2 diabetes mellitus with diabetic neuropathy, unspecified: Secondary | ICD-10-CM | POA: Diagnosis not present

## 2020-01-02 DIAGNOSIS — I25118 Atherosclerotic heart disease of native coronary artery with other forms of angina pectoris: Secondary | ICD-10-CM | POA: Diagnosis not present

## 2020-01-02 DIAGNOSIS — I10 Essential (primary) hypertension: Secondary | ICD-10-CM | POA: Diagnosis not present

## 2020-01-02 DIAGNOSIS — E785 Hyperlipidemia, unspecified: Secondary | ICD-10-CM | POA: Diagnosis not present

## 2020-01-02 DIAGNOSIS — E1342 Other specified diabetes mellitus with diabetic polyneuropathy: Secondary | ICD-10-CM | POA: Diagnosis not present

## 2020-01-02 DIAGNOSIS — N400 Enlarged prostate without lower urinary tract symptoms: Secondary | ICD-10-CM | POA: Diagnosis not present

## 2020-01-11 ENCOUNTER — Other Ambulatory Visit: Payer: Self-pay | Admitting: *Deleted

## 2020-01-11 NOTE — Patient Outreach (Signed)
[facility]) Care Management  [place]  01/11/2020   Keith Espinoza Feb 13, 1941 [id number]   RN Health Coach telephone call to patient.  Hipaa compliance verified. Per patient his fasting blood sugar this am was 88. Patient stated that he was started on Trulicity and lost @ 16 pounds. Patient stated that he is now started on Jardiance. His appetite is beginning to pick up some. Patient stated that he is feeling weak when he walks since loosing weight like he doesn't have strength and more. Per patient his legs get real tired. Patient has agreed to follow up out reach calls  Encounter Medications:  Outpatient Encounter Medications as of 01/11/2020  Medication Sig Note  . allopurinol (ZYLOPRIM) 300 MG tablet Take 300 mg by mouth daily.    Marland Kitchen amLODipine (NORVASC) 2.5 MG tablet Take 2.5 mg by mouth daily.   Marland Kitchen aspirin EC 81 MG tablet Take 81 mg by mouth daily.   . Cholecalciferol (VITAMIN D3) 50 MCG (2000 UT) TABS Take 2,000 Units by mouth daily.    . clopidogrel (PLAVIX) 75 MG tablet TAKE ONE TABLET BY MOUTH ONE TIME DAILY (Patient taking differently: Take 75 mg by mouth daily. )   . gabapentin (NEURONTIN) 300 MG capsule Take 600 mg by mouth 2 (two) times daily.    Marland Kitchen glimepiride (AMARYL) 4 MG tablet Take 4 mg by mouth 2 (two) times daily.    . hydrochlorothiazide (MICROZIDE) 12.5 MG capsule Take 12.5 mg by mouth daily.    Marland Kitchen HYDROcodone-acetaminophen (NORCO) 5-325 MG tablet Take 1 tablet by mouth every 4 (four) hours as needed for moderate pain. Post hip revision pain (Patient not taking: Reported on 04/24/2019)   . insulin detemir (LEVEMIR) 100 UNIT/ML injection Inject 12-30.5 Units into the skin at bedtime.  04/13/2019: Pt. Took 20 units @ 2200  . isosorbide mononitrate (IMDUR) 30 MG 24 hr tablet TAKE 1 TABLET(30 MG) BY MOUTH DAILY   . KLOR-CON M20 20 MEQ tablet Take 20 mEq by mouth daily.    Marland Kitchen losartan (COZAAR) 50 MG tablet Take 50 mg by mouth daily.   . metFORMIN  (GLUCOPHAGE) 1000 MG tablet Take 1 tablet (1,000 mg total) by mouth 2 (two) times daily.   . nitroGLYCERIN (NITROSTAT) 0.4 MG SL tablet Place 0.4 mg under the tongue every 5 (five) minutes as needed for chest pain.   . ONE TOUCH ULTRA TEST test strip CHECK BLOOD SUGAR ONCE DAILY AS DIRECTED   . rosuvastatin (CRESTOR) 10 MG tablet Take 1 tablet (10 mg total) by mouth once a week.   . terazosin (HYTRIN) 5 MG capsule Take 5 mg by mouth at bedtime.     No facility-administered encounter medications on file as of 01/11/2020.    Functional Status:  In your present state of health, do you have any difficulty performing the following activities: 10/12/2019 04/11/2019  Hearing? N N  Vision? N N  Difficulty concentrating or making decisions? N N  Walking or climbing stairs? Y Y  Comment Patient is having popping and clicking in hip. Per patient it causes him to hop when he walks -  Dressing or bathing? N Y  Doing errands, shopping? N Y  Conservation officer, nature and eating ? Y -  Comment Patient stated they eat out mostly he doesn't cook -  Using the Toilet? N -  In the past six months, have you accidently leaked urine? N -  Do you have problems with loss of bowel control? N -  Managing your Medications? N -  Managing your Finances? N -  Housekeeping or managing your Housekeeping? Y -  Comment Daughter helps -  Some recent data might be hidden    Fall/Depression Screening: Fall Risk  10/12/2019  Falls in the past year? 0  Number falls in past yr: 0  Injury with Fall? 0  Risk for fall due to : Impaired balance/gait;Impaired mobility  Follow up Falls evaluation completed;Falls prevention discussed;Education provided   PHQ 2/9 Scores 10/12/2019  PHQ - 2 Score 0   THN CM Care Plan Problem One     Most Recent Value  Care Plan Problem One  Knowledge Deficit in Self Maintenace of Diabetes  Role Documenting the Problem One  [place] for Problem One  Active  THN Long Term Goal   Patient will not  have any hypoglycemic reactions within the next 90 days  Interventions for Problem One Long Term Goal  RN wants to follow patient since patient wasplaced on trulicity and lost weight. He has now been charged to [employer]. RN will follow for further discussion and monitoring  THN CM Short Term Goal #1   Patient will verbalize checking and documenting blood sugars within the next 30 days  Interventions for Short Term Goal #1  RN will continue to checkblood sugars and increasing from once a day to twice a day due to adding Jardiance to regimen. RN will follow upfor further discussion  THN CM Short Term Goal #2   Patient will verbalize receiving educational material on making better choices eating out within the next 30 days  THN CM Short Term Goal #2 Met Date  01/11/20  [facility] CM Short Term Goal #3  Patient will verbalize choosing healthier snacks.  Interventions for Short Tern Goal #3  RN discussed healthy snacks. Patienthasbeen drinking supplement due to decrease in appetite. Nwill continue to follow up with further monitoring and discussion       Assessment:  FBS 88 A1C 6.8 Received COVID vaccine Patient has weight loss of @ 16 pounds Patient is now on [place]:  RN discussed weight loss RN discussed supplemental nutrition RN sent message to PCP for possible PT RN discussed continue pandemic precautions after vaccine RN discussed medication adherence RN discussed monitoring blood sugars RN sent Glucerna coupons for nutritional supplement RN will follow up outreach within the month of June  Keith Espinoza [place] Management [phone]

## 2020-01-14 ENCOUNTER — Encounter: Payer: Self-pay | Admitting: Physician Assistant

## 2020-01-14 ENCOUNTER — Other Ambulatory Visit: Payer: Self-pay

## 2020-01-14 ENCOUNTER — Ambulatory Visit (INDEPENDENT_AMBULATORY_CARE_PROVIDER_SITE_OTHER): Payer: Medicare Other | Admitting: Physician Assistant

## 2020-01-14 DIAGNOSIS — C44519 Basal cell carcinoma of skin of other part of trunk: Secondary | ICD-10-CM

## 2020-01-14 DIAGNOSIS — L570 Actinic keratosis: Secondary | ICD-10-CM | POA: Diagnosis not present

## 2020-01-14 DIAGNOSIS — C44511 Basal cell carcinoma of skin of breast: Secondary | ICD-10-CM

## 2020-01-14 DIAGNOSIS — C4492 Squamous cell carcinoma of skin, unspecified: Secondary | ICD-10-CM

## 2020-01-14 MED ORDER — FLUOROURACIL 5 % EX CREA: TOPICAL_CREAM | Freq: Every day | CUTANEOUS | 1 refills | Status: AC

## 2020-01-14 NOTE — Patient Instructions (Signed)

## 2020-01-14 NOTE — Progress Notes (Addendum)
   Follow up Visit  Subjective  Keith Espinoza is a 79 y.o. male who presents for the following: Basal Cell Carcinoma (Left chest- here for treatment). He also states he still has scaling areas on his sideburns and ears. We had frozen those areas with liquid nitrogen his last visit.  Objective  Well appearing patient in no apparent distress; mood and affect are within normal limits.  A focused examination was performed including left chest and face.. Relevant physical exam findings are noted in the Assessment and Plan. Biopsy scar identified.   Objective  Left Ear (2), Left Preauricular Area, Right Ear, Right Zygomatic Area: Erythematous patches with gritty scale.  Objective  Left Breast: Biopsy scar identified.   Assessment & Plan  AK (actinic keratosis) (5) Left Ear (2); Right Ear; Left Preauricular Area; Right Zygomatic Area  5 Fluorouracil cream.  fluorouracil (EFUDEX) 5 % cream - Left Ear (2), Left Preauricular Area, Right Ear, Right Zygomatic Area  Basal cell carcinoma (BCC) of skin of left breast Left Breast  Destruction of lesion Complexity: simple   Destruction method: electrodesiccation and curettage   Informed consent: discussed and consent obtained   Timeout:  patient name, date of birth, surgical site, and procedure verified Anesthesia: the lesion was anesthetized in a standard fashion   Anesthetic:  1% lidocaine w/ epinephrine 1-100,000 local infiltration Curettage performed in three different directions: Yes   Lesion length (cm):  3 Lesion width (cm):  1 Margin per side (cm):  0 Final wound size (cm):  3 Hemostasis achieved with:  ferric subsulfate Outcome: patient tolerated procedure well with no complications   Post-procedure details: wound care instructions given   Additional details:  Wound innoculated with 5 fluorouracil solution.

## 2020-01-15 DIAGNOSIS — E1165 Type 2 diabetes mellitus with hyperglycemia: Secondary | ICD-10-CM | POA: Diagnosis not present

## 2020-01-17 DIAGNOSIS — Z794 Long term (current) use of insulin: Secondary | ICD-10-CM | POA: Diagnosis not present

## 2020-01-17 DIAGNOSIS — E1165 Type 2 diabetes mellitus with hyperglycemia: Secondary | ICD-10-CM | POA: Diagnosis not present

## 2020-01-17 DIAGNOSIS — E669 Obesity, unspecified: Secondary | ICD-10-CM | POA: Diagnosis not present

## 2020-01-17 DIAGNOSIS — Z7189 Other specified counseling: Secondary | ICD-10-CM | POA: Diagnosis not present

## 2020-01-17 DIAGNOSIS — I2510 Atherosclerotic heart disease of native coronary artery without angina pectoris: Secondary | ICD-10-CM | POA: Diagnosis not present

## 2020-01-17 DIAGNOSIS — I10 Essential (primary) hypertension: Secondary | ICD-10-CM | POA: Diagnosis not present

## 2020-01-17 DIAGNOSIS — E785 Hyperlipidemia, unspecified: Secondary | ICD-10-CM | POA: Diagnosis not present

## 2020-01-17 DIAGNOSIS — E1140 Type 2 diabetes mellitus with diabetic neuropathy, unspecified: Secondary | ICD-10-CM | POA: Diagnosis not present

## 2020-01-18 DIAGNOSIS — E1342 Other specified diabetes mellitus with diabetic polyneuropathy: Secondary | ICD-10-CM | POA: Diagnosis not present

## 2020-01-18 DIAGNOSIS — E1140 Type 2 diabetes mellitus with diabetic neuropathy, unspecified: Secondary | ICD-10-CM | POA: Diagnosis not present

## 2020-01-18 DIAGNOSIS — E1165 Type 2 diabetes mellitus with hyperglycemia: Secondary | ICD-10-CM | POA: Diagnosis not present

## 2020-01-18 DIAGNOSIS — N400 Enlarged prostate without lower urinary tract symptoms: Secondary | ICD-10-CM | POA: Diagnosis not present

## 2020-01-18 DIAGNOSIS — I10 Essential (primary) hypertension: Secondary | ICD-10-CM | POA: Diagnosis not present

## 2020-01-18 DIAGNOSIS — E785 Hyperlipidemia, unspecified: Secondary | ICD-10-CM | POA: Diagnosis not present

## 2020-01-18 DIAGNOSIS — I25118 Atherosclerotic heart disease of native coronary artery with other forms of angina pectoris: Secondary | ICD-10-CM | POA: Diagnosis not present

## 2020-01-19 ENCOUNTER — Other Ambulatory Visit: Payer: Self-pay | Admitting: Physician Assistant

## 2020-01-30 NOTE — Progress Notes (Signed)
Cardiology Office Note   Date:  01/31/2020   ID:  Keith Espinoza 03-27-1941, MRN [id number]  PCP:  [name], MD    No chief complaint on file.  CAD  Wt Readings from Last 3 Encounters:  01/31/20 178 lb (80.7 kg)  05/08/19 196 lb (88.9 kg)  05/01/19 196 lb (88.9 kg)       History of Present Illness: Keith Espinoza is a 79 y.o. male  with history of CAD S/P stenting RCA 2004 with staged DES-LAD and Cfx 2004, DES RCA 2012, DES mCfx, PTCA dCfx 2013, low risk NST 04/2014.  He was advised to quit chewing tobacco.    Denies : exertional Chest pain. Dizziness. Leg edema. Nitroglycerin use. Orthopnea. Palpitations. Paroxysmal nocturnal dyspnea. Shortness of breath. Syncope.   He has had his COVID vaccines.  Still using chewing tobacco.  Not planning to quit.     Past Medical History:  Diagnosis Date  . Allergic rhinitis   . Allergic rhinitis   . Arthritis   . Basal cell carcinoma 11/01/2019    bcc left chest treatment [place] cx3 85fu   . Chronic leg pain    right  . Chronic lower back pain   . Coronary artery disease    a. Stenting to RCA 2004; staged DES to LAD and Cx 2004. DES to mRCA 2012. b. DES to mCx, PTCA to dCx 11/2011. c. Lateral wall MI 2013 s/p PTCA to distal Cx & DES to mid OM2 11/2011. d. Low risk nuc 04/2014, EF wnl.  . Diabetes mellitus    Insulin dependent  . Diabetic neuropathy (HCC)    MILD  . Diverticulosis   . Dysrhythmia   . Gilbert syndrome   . Gout    right wrist; right foot; right elbow; have had it since 1970's  . H/O hiatal hernia   . Heart murmur   . History of echocardiogram    aortic sclerosis per echo 12/09 EF 65%, otherwise normal  . History of hemorrhoids    BLEEDING  . History of kidney stones    h/o  . Hypertension    Diagnosed 1995   . Pancreatic pseudocyst    a. s/p remote drainage 2006.  Marland Kitchen Thrombocytopenia ([place])    Seen on oldest labs in system from 2004  . Vitamin B 12 deficiency    orally replaced    Past  Surgical History:  Procedure Laterality Date  . BACK SURGERY     "total of 3 times" S/P fall   . CARPAL TUNNEL RELEASE Bilateral   . CHOLECYSTECTOMY  1990's  . COLONOSCOPY    . CORONARY ANGIOPLASTY  11/11/11  . CORONARY ANGIOPLASTY WITH STENT PLACEMENT  09/30/2011   "1 then; makes a total of 4"  . CORONARY ANGIOPLASTY WITH STENT PLACEMENT  11/11/11   "1; makes a total of 5"  . INGUINAL HERNIA REPAIR  2003   right  . JOINT REPLACEMENT Right 04/03/2002   hip replacment  . KNEE ARTHROSCOPY  1990's   left  . LEFT HEART CATHETERIZATION WITH CORONARY ANGIOGRAM N/A 09/30/2011   Procedure: LEFT HEART CATHETERIZATION WITH CORONARY ANGIOGRAM;  Surgeon: Jettie Booze, MD;  Location: [facility] CATH LAB;  Service: Cardiovascular;  Laterality: N/A;  possible PCI  . LEFT HEART CATHETERIZATION WITH CORONARY ANGIOGRAM N/A 11/15/2011   Procedure: LEFT HEART CATHETERIZATION WITH CORONARY ANGIOGRAM;  Surgeon: Jettie Booze, MD;  Location: [facility] CATH LAB;  Service: Cardiovascular;  Laterality: N/A;  . PERCUTANEOUS  CORONARY STENT INTERVENTION (PCI-S)  09/30/2011   Procedure: PERCUTANEOUS CORONARY STENT INTERVENTION (PCI-S);  Surgeon: Jettie Booze, MD;  Location: [facility] CATH LAB;  Service: Cardiovascular;;  . PERCUTANEOUS CORONARY STENT INTERVENTION (PCI-S) N/A 11/11/2011   Procedure: PERCUTANEOUS CORONARY STENT INTERVENTION (PCI-S);  Surgeon: Jettie Booze, MD;  Location: [facility] CATH LAB;  Service: Cardiovascular;  Laterality: N/A;  . SHOULDER SURGERY Right    X 2  . TONSILLECTOMY  ~ 1948  . TOTAL HIP REVISION Right 04/13/2019   Procedure: RIGHT TOTAL HIP REVISION-POSTERIOR  APPROACH LATERAL;  Surgeon: Marybelle Killings, MD;  Location: [place];  Service: Orthopedics;  Laterality: Right;  . TOTAL KNEE ARTHROPLASTY Left 07/23/2016   Procedure: LEFT TOTAL KNEE ARTHROPLASTY;  Surgeon: Marybelle Killings, MD;  Location: [place];  Service: Orthopedics;  Laterality: Left;     Current Outpatient Medications  Medication Sig  Dispense Refill  . allopurinol (ZYLOPRIM) 300 MG tablet Take 300 mg by mouth daily.     Marland Kitchen amLODipine (NORVASC) 2.5 MG tablet Take 2.5 mg by mouth daily.    Marland Kitchen aspirin EC 81 MG tablet Take 81 mg by mouth daily.    . Cholecalciferol (VITAMIN D3) 50 MCG (2000 UT) TABS Take 2,000 Units by mouth daily.     . clopidogrel (PLAVIX) 75 MG tablet TAKE ONE TABLET BY MOUTH ONE TIME DAILY 30 tablet 5  . empagliflozin (JARDIANCE) 10 MG TABS tablet Take 10 mg by mouth daily.    Marland Kitchen gabapentin (NEURONTIN) 300 MG capsule Take 600 mg by mouth 2 (two) times daily.   3  . hydrochlorothiazide (MICROZIDE) 12.5 MG capsule Take 12.5 mg by mouth daily.   2  . HYDROcodone-acetaminophen (NORCO) 5-325 MG tablet Take 1 tablet by mouth every 4 (four) hours as needed for moderate pain. Post hip revision pain 30 tablet 0  . insulin detemir (LEVEMIR) 100 UNIT/ML injection Inject 12-30.5 Units into the skin at bedtime.     . isosorbide mononitrate (IMDUR) 30 MG 24 hr tablet Take 1 tablet (30 mg total) by mouth daily. Please keep upcoming appt in April before anymore refills. Thank you 90 tablet 0  . KLOR-CON M20 20 MEQ tablet Take 20 mEq by mouth daily.   3  . losartan (COZAAR) 50 MG tablet Take 50 mg by mouth daily.  2  . metFORMIN (GLUCOPHAGE) 1000 MG tablet Take 1 tablet (1,000 mg total) by mouth 2 (two) times daily.    . nitroGLYCERIN (NITROSTAT) 0.4 MG SL tablet Place 0.4 mg under the tongue every 5 (five) minutes as needed for chest pain.    . ONE TOUCH ULTRA TEST test strip CHECK BLOOD SUGAR ONCE DAILY AS DIRECTED  5  . rosuvastatin (CRESTOR) 10 MG tablet Take 1 tablet (10 mg total) by mouth once a week. 15 tablet 3  . terazosin (HYTRIN) 5 MG capsule Take 5 mg by mouth at bedtime.      No current facility-administered medications for this visit.    Allergies:   Simvastatin, Zetia [ezetimibe], Dilaudid [hydromorphone hcl], and Fish oil    Social History:  The patient  reports that he has quit smoking. His smokeless  tobacco use includes chew. He reports that he does not drink alcohol or use drugs.   Family History:  The patient's family history includes Cancer in his brother and father; Diabetes in his mother and sister; Hyperlipidemia in his mother; Hypertension in his father, mother, and sister.    ROS:  Please see the history of  present illness.   Otherwise, review of systems are positive for atypical chest pain.   All other systems are reviewed and negative.    PHYSICAL EXAM: VS:  BP 138/60   Pulse 64   Ht 5\' 10"  (1.778 m)   Wt 178 lb (80.7 kg)   SpO2 97%   BMI 25.54 kg/m  , BMI Body mass index is 25.54 kg/m. GEN: Well nourished, well developed, in no acute distress  HEENT: normal  Neck: no JVD, carotid bruits, or masses Cardiac: RRR; no murmurs, rubs, or gallops,no edema  Respiratory:  clear to auscultation bilaterally, normal work of breathing GI: soft, nontender, nondistended, + BS MS: no deformity or atrophy  Skin: warm and dry, no rash Neuro:  Strength and sensation are intact Psych: euthymic mood, full affect   EKG:   The ekg ordered 7/20 demonstrates NSR, nonspecific ST changes   Recent Labs: 04/10/2019: ALT 18 04/14/2019: BUN 13; Creatinine, Ser 1.16; Potassium 3.8; Sodium 140 04/15/2019: Hemoglobin 9.6; Platelets 119   Lipid Panel    Component Value Date/Time   CHOL 117 04/06/2014 0230   TRIG 123 04/06/2014 0230   HDL 34 (L) 04/06/2014 0230   CHOLHDL 3.4 04/06/2014 0230   VLDL 25 04/06/2014 0230   LDLCALC 58 04/06/2014 0230     Other studies Reviewed: Additional studies/ records that were reviewed today with results demonstrating: PMD labs reviewed.   ASSESSMENT AND PLAN:  1. CAD: No angina.  Continue secondary prevention.  Activity target as noted below.  Careful to avoid falling.  2. HTN: The current medical regimen is effective;  continue present plan and medications. 3. Hyperlipidemia: LDL 77 in 11/2018.  Tolerating rosuvastatin once a week.  Labs done with  PMD. Lipids controlled.  4. DM: A1C 6.6. Using insuloin and seeing endocrinologist.  Lost weight with trulicity.  5. Tobacco abuse: Not trying to quit.    Current medicines are reviewed at length with the patient today.  The patient concerns regarding his medicines were addressed.  The following changes have been made:  No change  Labs/ tests ordered today include:  No orders of the defined types were placed in this encounter.   Recommend 150 minutes/week of aerobic exercise Low fat, low carb, high fiber diet recommended  Disposition:   FU in 1 year   Signed, Larae Grooms, MD  01/31/2020 11:51 AM    [facility] [address], [place], [place]  [address] Phone: [phone]; Fax: [phone]

## 2020-01-31 ENCOUNTER — Other Ambulatory Visit: Payer: Self-pay

## 2020-01-31 ENCOUNTER — Encounter: Payer: Self-pay | Admitting: Interventional Cardiology

## 2020-01-31 ENCOUNTER — Ambulatory Visit (INDEPENDENT_AMBULATORY_CARE_PROVIDER_SITE_OTHER): Payer: Medicare Other | Admitting: Interventional Cardiology

## 2020-01-31 VITALS — BP 138/60 | HR 64 | Ht 70.0 in | Wt 178.0 lb

## 2020-01-31 DIAGNOSIS — I10 Essential (primary) hypertension: Secondary | ICD-10-CM

## 2020-01-31 DIAGNOSIS — Z720 Tobacco use: Secondary | ICD-10-CM | POA: Diagnosis not present

## 2020-01-31 DIAGNOSIS — E119 Type 2 diabetes mellitus without complications: Secondary | ICD-10-CM | POA: Diagnosis not present

## 2020-01-31 DIAGNOSIS — E782 Mixed hyperlipidemia: Secondary | ICD-10-CM

## 2020-01-31 DIAGNOSIS — Z794 Long term (current) use of insulin: Secondary | ICD-10-CM

## 2020-01-31 DIAGNOSIS — I2510 Atherosclerotic heart disease of native coronary artery without angina pectoris: Secondary | ICD-10-CM

## 2020-01-31 NOTE — Patient Instructions (Signed)
Medication Instructions:  Your physician recommends that you continue on your current medications as directed. Please refer to the Current Medication list given to you today.  *If you need a refill on your cardiac medications before your next appointment, please call your pharmacy*   Lab Work: None ordered  If you have labs (blood work) drawn today and your tests are completely normal, you will receive your results only by: . MyChart Message (if you have MyChart) OR . A paper copy in the mail If you have any lab test that is abnormal or we need to change your treatment, we will call you to review the results.   Testing/Procedures: None ordered   Follow-Up: At [facility], you and your health needs are our priority.  As part of our continuing mission to provide you with exceptional heart care, we have created designated Provider Care Teams.  These Care Teams include your primary Cardiologist (physician) and Advanced Practice Providers (APPs -  Physician Assistants and Nurse Practitioners) who all work together to provide you with the care you need, when you need it.  We recommend signing up for the patient portal called "MyChart".  Sign up information is provided on this After Visit Summary.  MyChart is used to connect with patients for Virtual Visits (Telemedicine).  Patients are able to view lab/test results, encounter notes, upcoming appointments, etc.  Non-urgent messages can be sent to your provider as well.   To learn more about what you can do with MyChart, go to [email or web address].    Your next appointment:   12 month(s)  The format for your next appointment:   In Person  Provider:   You may see Jayadeep Varanasi, MD or one of the following Advanced Practice Providers on your designated Care Team:    Dayna Dunn, PA-C  Michele Lenze, PA-C    Other Instructions   

## 2020-02-20 NOTE — Addendum Note (Signed)
Addended by: Arlyss Gandy R on: 02/20/2020 05:46 PM   Modules accepted: Level of Service

## 2020-02-22 DIAGNOSIS — E1342 Other specified diabetes mellitus with diabetic polyneuropathy: Secondary | ICD-10-CM | POA: Diagnosis not present

## 2020-02-22 DIAGNOSIS — E7841 Elevated Lipoprotein(a): Secondary | ICD-10-CM | POA: Diagnosis not present

## 2020-02-22 DIAGNOSIS — N400 Enlarged prostate without lower urinary tract symptoms: Secondary | ICD-10-CM | POA: Diagnosis not present

## 2020-02-22 DIAGNOSIS — E785 Hyperlipidemia, unspecified: Secondary | ICD-10-CM | POA: Diagnosis not present

## 2020-02-22 DIAGNOSIS — E1140 Type 2 diabetes mellitus with diabetic neuropathy, unspecified: Secondary | ICD-10-CM | POA: Diagnosis not present

## 2020-02-22 DIAGNOSIS — I10 Essential (primary) hypertension: Secondary | ICD-10-CM | POA: Diagnosis not present

## 2020-02-22 DIAGNOSIS — I25118 Atherosclerotic heart disease of native coronary artery with other forms of angina pectoris: Secondary | ICD-10-CM | POA: Diagnosis not present

## 2020-02-22 DIAGNOSIS — E1165 Type 2 diabetes mellitus with hyperglycemia: Secondary | ICD-10-CM | POA: Diagnosis not present

## 2020-02-27 NOTE — Addendum Note (Signed)
Addended by: Arlyss Gandy R on: 02/27/2020 11:20 AM   Modules accepted: Orders

## 2020-03-12 ENCOUNTER — Other Ambulatory Visit: Payer: Self-pay | Admitting: *Deleted

## 2020-03-12 NOTE — Patient Outreach (Signed)
[place] [facility]) Care Management  03/12/2020  [name] [birth year] [id number]   RN Health Coach attempted follow up outreach call to patient.  Patient was unavailable. No voicemail message pickup. Plan: RN will call patient again within 30 days.  [place] Care Management [phone]

## 2020-03-18 ENCOUNTER — Other Ambulatory Visit: Payer: Self-pay | Admitting: *Deleted

## 2020-03-18 NOTE — Patient Outreach (Addendum)
[place] [facility]) Care Management  03/18/2020  Keith Espinoza [birth year] [id number]   RN Health Coach attempted follow up outreach call to patient.  Patient was unavailable. HIPPA compliance voicemail message left with return callback number.  Plan: RN will call patient again within 30 days. Unsuccessful outreach letter sent  [place] [facility] [phone]

## 2020-03-28 DIAGNOSIS — H2513 Age-related nuclear cataract, bilateral: Secondary | ICD-10-CM | POA: Diagnosis not present

## 2020-03-28 DIAGNOSIS — H40033 Anatomical narrow angle, bilateral: Secondary | ICD-10-CM | POA: Diagnosis not present

## 2020-04-10 DIAGNOSIS — E1165 Type 2 diabetes mellitus with hyperglycemia: Secondary | ICD-10-CM | POA: Diagnosis not present

## 2020-04-10 DIAGNOSIS — E538 Deficiency of other specified B group vitamins: Secondary | ICD-10-CM | POA: Diagnosis not present

## 2020-04-14 ENCOUNTER — Other Ambulatory Visit: Payer: Self-pay | Admitting: *Deleted

## 2020-04-14 NOTE — Patient Outreach (Signed)
[place] [facility]) Care Management  04/14/2020  RIVAAN KENDALL Nov 19, 1940 [id number]   RN Health Coach attempted follow up outreach call to patient.  Patient was unavailable. RN spoke with patient wife [name]. Per patient wife he is doing better and trying hard. She is on dialysis and he usually takes her TUES, Thurs and Sat. He is out running errands today.   Plan: RN will call patient again within 30 days.  [place] Care Management [phone]

## 2020-04-15 ENCOUNTER — Ambulatory Visit: Payer: Self-pay | Admitting: *Deleted

## 2020-04-16 DIAGNOSIS — E1342 Other specified diabetes mellitus with diabetic polyneuropathy: Secondary | ICD-10-CM | POA: Diagnosis not present

## 2020-04-16 DIAGNOSIS — I10 Essential (primary) hypertension: Secondary | ICD-10-CM | POA: Diagnosis not present

## 2020-04-16 DIAGNOSIS — N400 Enlarged prostate without lower urinary tract symptoms: Secondary | ICD-10-CM | POA: Diagnosis not present

## 2020-04-16 DIAGNOSIS — E785 Hyperlipidemia, unspecified: Secondary | ICD-10-CM | POA: Diagnosis not present

## 2020-04-16 DIAGNOSIS — E1140 Type 2 diabetes mellitus with diabetic neuropathy, unspecified: Secondary | ICD-10-CM | POA: Diagnosis not present

## 2020-04-16 DIAGNOSIS — E7849 Other hyperlipidemia: Secondary | ICD-10-CM | POA: Diagnosis not present

## 2020-04-16 DIAGNOSIS — I25118 Atherosclerotic heart disease of native coronary artery with other forms of angina pectoris: Secondary | ICD-10-CM | POA: Diagnosis not present

## 2020-04-16 DIAGNOSIS — E1165 Type 2 diabetes mellitus with hyperglycemia: Secondary | ICD-10-CM | POA: Diagnosis not present

## 2020-04-17 ENCOUNTER — Ambulatory Visit: Payer: Medicare Other | Admitting: Physician Assistant

## 2020-04-17 DIAGNOSIS — E1165 Type 2 diabetes mellitus with hyperglycemia: Secondary | ICD-10-CM | POA: Diagnosis not present

## 2020-04-17 DIAGNOSIS — Z794 Long term (current) use of insulin: Secondary | ICD-10-CM | POA: Diagnosis not present

## 2020-04-17 DIAGNOSIS — Z6826 Body mass index (BMI) 26.0-26.9, adult: Secondary | ICD-10-CM | POA: Diagnosis not present

## 2020-04-17 DIAGNOSIS — E785 Hyperlipidemia, unspecified: Secondary | ICD-10-CM | POA: Diagnosis not present

## 2020-04-17 DIAGNOSIS — E1140 Type 2 diabetes mellitus with diabetic neuropathy, unspecified: Secondary | ICD-10-CM | POA: Diagnosis not present

## 2020-04-17 DIAGNOSIS — I2510 Atherosclerotic heart disease of native coronary artery without angina pectoris: Secondary | ICD-10-CM | POA: Diagnosis not present

## 2020-04-17 DIAGNOSIS — I10 Essential (primary) hypertension: Secondary | ICD-10-CM | POA: Diagnosis not present

## 2020-04-24 ENCOUNTER — Encounter: Payer: Self-pay | Admitting: Physician Assistant

## 2020-04-24 ENCOUNTER — Ambulatory Visit (INDEPENDENT_AMBULATORY_CARE_PROVIDER_SITE_OTHER): Payer: Medicare Other | Admitting: Physician Assistant

## 2020-04-24 ENCOUNTER — Other Ambulatory Visit: Payer: Self-pay

## 2020-04-24 DIAGNOSIS — Z85828 Personal history of other malignant neoplasm of skin: Secondary | ICD-10-CM | POA: Diagnosis not present

## 2020-04-24 DIAGNOSIS — L570 Actinic keratosis: Secondary | ICD-10-CM

## 2020-04-24 DIAGNOSIS — I2510 Atherosclerotic heart disease of native coronary artery without angina pectoris: Secondary | ICD-10-CM

## 2020-04-24 NOTE — Progress Notes (Signed)
   Follow up Visit  Subjective  Keith Espinoza is a 79 y.o. male who presents for the following: Follow-up (AK's- sideburns & ears- 5FU- didnt really hae a reaction).  Objective  Well appearing patient in no apparent distress; mood and affect are within normal limits.  A focused examination was performed including ears, left chest, hands. Relevant physical exam findings are noted in the Assessment and Plan.   Objective  Right Dorsal Hand, Right Forearm - Posterior: Erythematous patches with gritty scale.  Objective  Left Breast: Surgical scar examined no sign of recurrence.    Assessment & Plan  AK (actinic keratosis) (2) Right Forearm - Posterior; Right Dorsal Hand  Redo  the left ear rim and top of the right hand nightly for 3 weeks.  Destruction of lesion - Right Dorsal Hand, Right Forearm - Posterior Complexity: simple   Destruction method: cryotherapy   Informed consent: discussed and consent obtained   Lesion destroyed using liquid nitrogen: Yes   Outcome: patient tolerated procedure well with no complications   Post-procedure details: wound care instructions given    History of basal cell carcinoma (BCC) Left Breast

## 2020-04-25 ENCOUNTER — Other Ambulatory Visit: Payer: Self-pay | Admitting: Interventional Cardiology

## 2020-05-30 DIAGNOSIS — E1140 Type 2 diabetes mellitus with diabetic neuropathy, unspecified: Secondary | ICD-10-CM | POA: Diagnosis not present

## 2020-05-30 DIAGNOSIS — E7849 Other hyperlipidemia: Secondary | ICD-10-CM | POA: Diagnosis not present

## 2020-05-30 DIAGNOSIS — N400 Enlarged prostate without lower urinary tract symptoms: Secondary | ICD-10-CM | POA: Diagnosis not present

## 2020-05-30 DIAGNOSIS — I10 Essential (primary) hypertension: Secondary | ICD-10-CM | POA: Diagnosis not present

## 2020-05-30 DIAGNOSIS — E1142 Type 2 diabetes mellitus with diabetic polyneuropathy: Secondary | ICD-10-CM | POA: Diagnosis not present

## 2020-05-30 DIAGNOSIS — I25118 Atherosclerotic heart disease of native coronary artery with other forms of angina pectoris: Secondary | ICD-10-CM | POA: Diagnosis not present

## 2020-05-30 DIAGNOSIS — E785 Hyperlipidemia, unspecified: Secondary | ICD-10-CM | POA: Diagnosis not present

## 2020-05-30 DIAGNOSIS — E1165 Type 2 diabetes mellitus with hyperglycemia: Secondary | ICD-10-CM | POA: Diagnosis not present

## 2020-06-11 ENCOUNTER — Other Ambulatory Visit: Payer: Self-pay | Admitting: *Deleted

## 2020-06-11 NOTE — Patient Outreach (Addendum)
[place] [facility]) Care Management  06/11/2020  Keith Espinoza 02/15/1941 [id number]   RN Health Coach attempted follow up outreach call to patient.  Patient was unavailable. Line busy.  Plan: RN will call patient again within 30 days.  [place] Care Management [phone]

## 2020-06-17 DIAGNOSIS — E1342 Other specified diabetes mellitus with diabetic polyneuropathy: Secondary | ICD-10-CM | POA: Diagnosis not present

## 2020-06-17 DIAGNOSIS — E785 Hyperlipidemia, unspecified: Secondary | ICD-10-CM | POA: Diagnosis not present

## 2020-06-17 DIAGNOSIS — E1140 Type 2 diabetes mellitus with diabetic neuropathy, unspecified: Secondary | ICD-10-CM | POA: Diagnosis not present

## 2020-06-17 DIAGNOSIS — E1165 Type 2 diabetes mellitus with hyperglycemia: Secondary | ICD-10-CM | POA: Diagnosis not present

## 2020-06-17 DIAGNOSIS — I10 Essential (primary) hypertension: Secondary | ICD-10-CM | POA: Diagnosis not present

## 2020-06-17 DIAGNOSIS — I25118 Atherosclerotic heart disease of native coronary artery with other forms of angina pectoris: Secondary | ICD-10-CM | POA: Diagnosis not present

## 2020-06-17 DIAGNOSIS — E7849 Other hyperlipidemia: Secondary | ICD-10-CM | POA: Diagnosis not present

## 2020-06-17 DIAGNOSIS — N400 Enlarged prostate without lower urinary tract symptoms: Secondary | ICD-10-CM | POA: Diagnosis not present

## 2020-06-25 ENCOUNTER — Ambulatory Visit: Payer: Self-pay | Admitting: *Deleted

## 2020-06-25 ENCOUNTER — Other Ambulatory Visit: Payer: Self-pay | Admitting: *Deleted

## 2020-06-25 MED ORDER — FLUOROURACIL 5 % EX CREA: TOPICAL_CREAM | Freq: Two times a day (BID) | CUTANEOUS | 1 refills | Status: DC

## 2020-07-02 DIAGNOSIS — R202 Paresthesia of skin: Secondary | ICD-10-CM | POA: Diagnosis not present

## 2020-07-02 DIAGNOSIS — R2689 Other abnormalities of gait and mobility: Secondary | ICD-10-CM | POA: Diagnosis not present

## 2020-07-02 DIAGNOSIS — G894 Chronic pain syndrome: Secondary | ICD-10-CM | POA: Diagnosis not present

## 2020-07-10 DIAGNOSIS — R202 Paresthesia of skin: Secondary | ICD-10-CM | POA: Diagnosis not present

## 2020-07-10 DIAGNOSIS — G894 Chronic pain syndrome: Secondary | ICD-10-CM | POA: Diagnosis not present

## 2020-07-12 IMAGING — DX DG HIP (WITH OR WITHOUT PELVIS) 1V PORT*R*
2 series · 2 of 2 positions shown · non-contrast
Comparison: Plain films right hip earlier today.

CLINICAL DATA: Status post reduction of right hip dislocation which
the patient suffered today while trying to put on shoes. Initial
encounter.

EXAM:
DG HIP (WITH OR WITHOUT PELVIS) 1V PORT RIGHT

[pelvis ap]
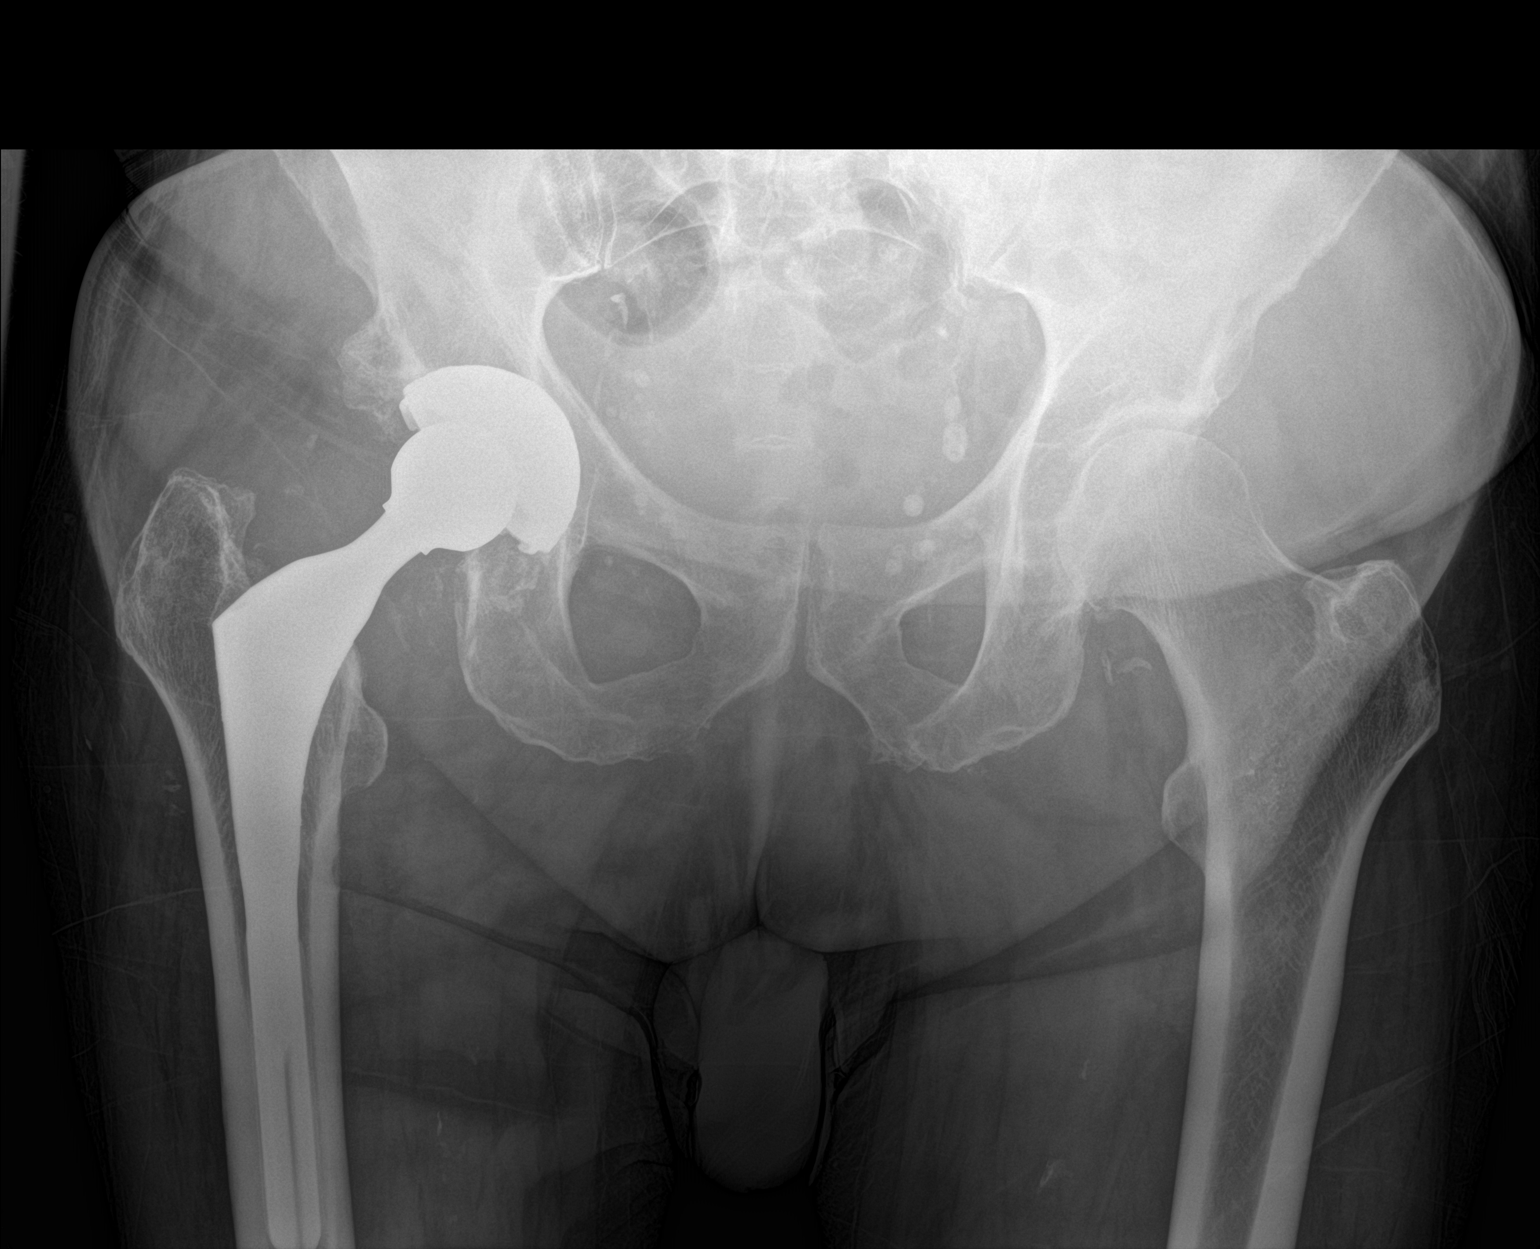

[hip lat]
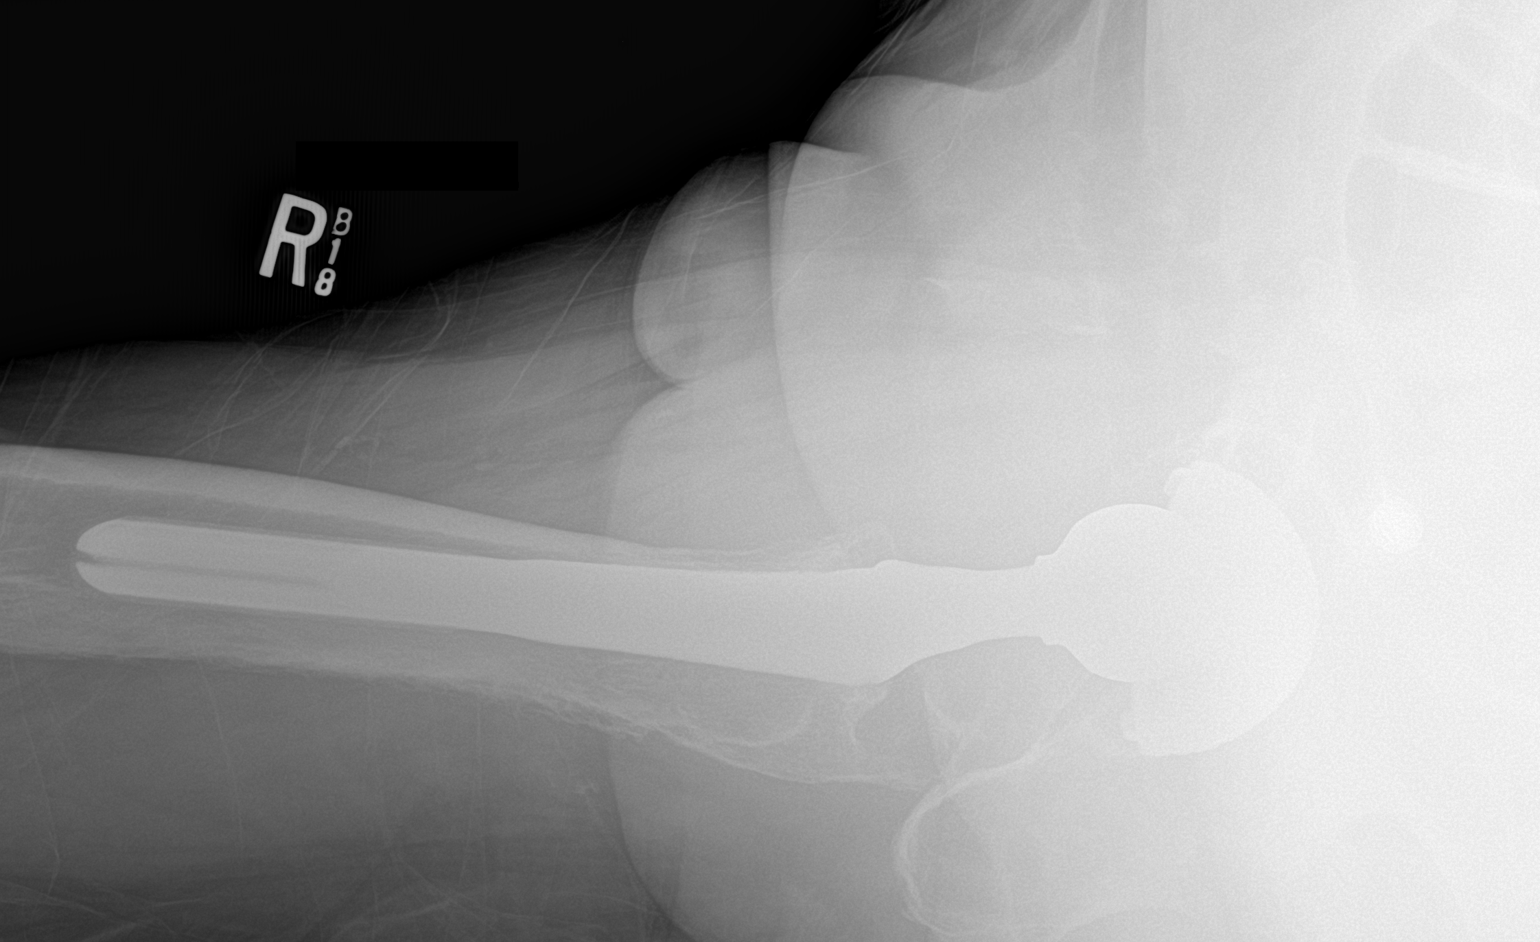

[2 of 2 positions shown; findings below may reference images not displayed]

FINDINGS: Dislocation of the patient's right hip prosthesis has been reduced.
No fracture or other acute abnormality is identified.
IMPRESSION: Successful reduction of dislocation of the patient's right hip
prosthesis. No acute abnormality.

## 2020-07-12 IMAGING — CR DG HIP (WITH OR WITHOUT PELVIS) 2-3V*R*
3 series · 3 of 3 positions shown · non-contrast
Comparison: None.

CLINICAL DATA: Right hip pain

EXAM:
DG HIP (WITH OR WITHOUT PELVIS) 2-3V RIGHT

[pelvis ap]
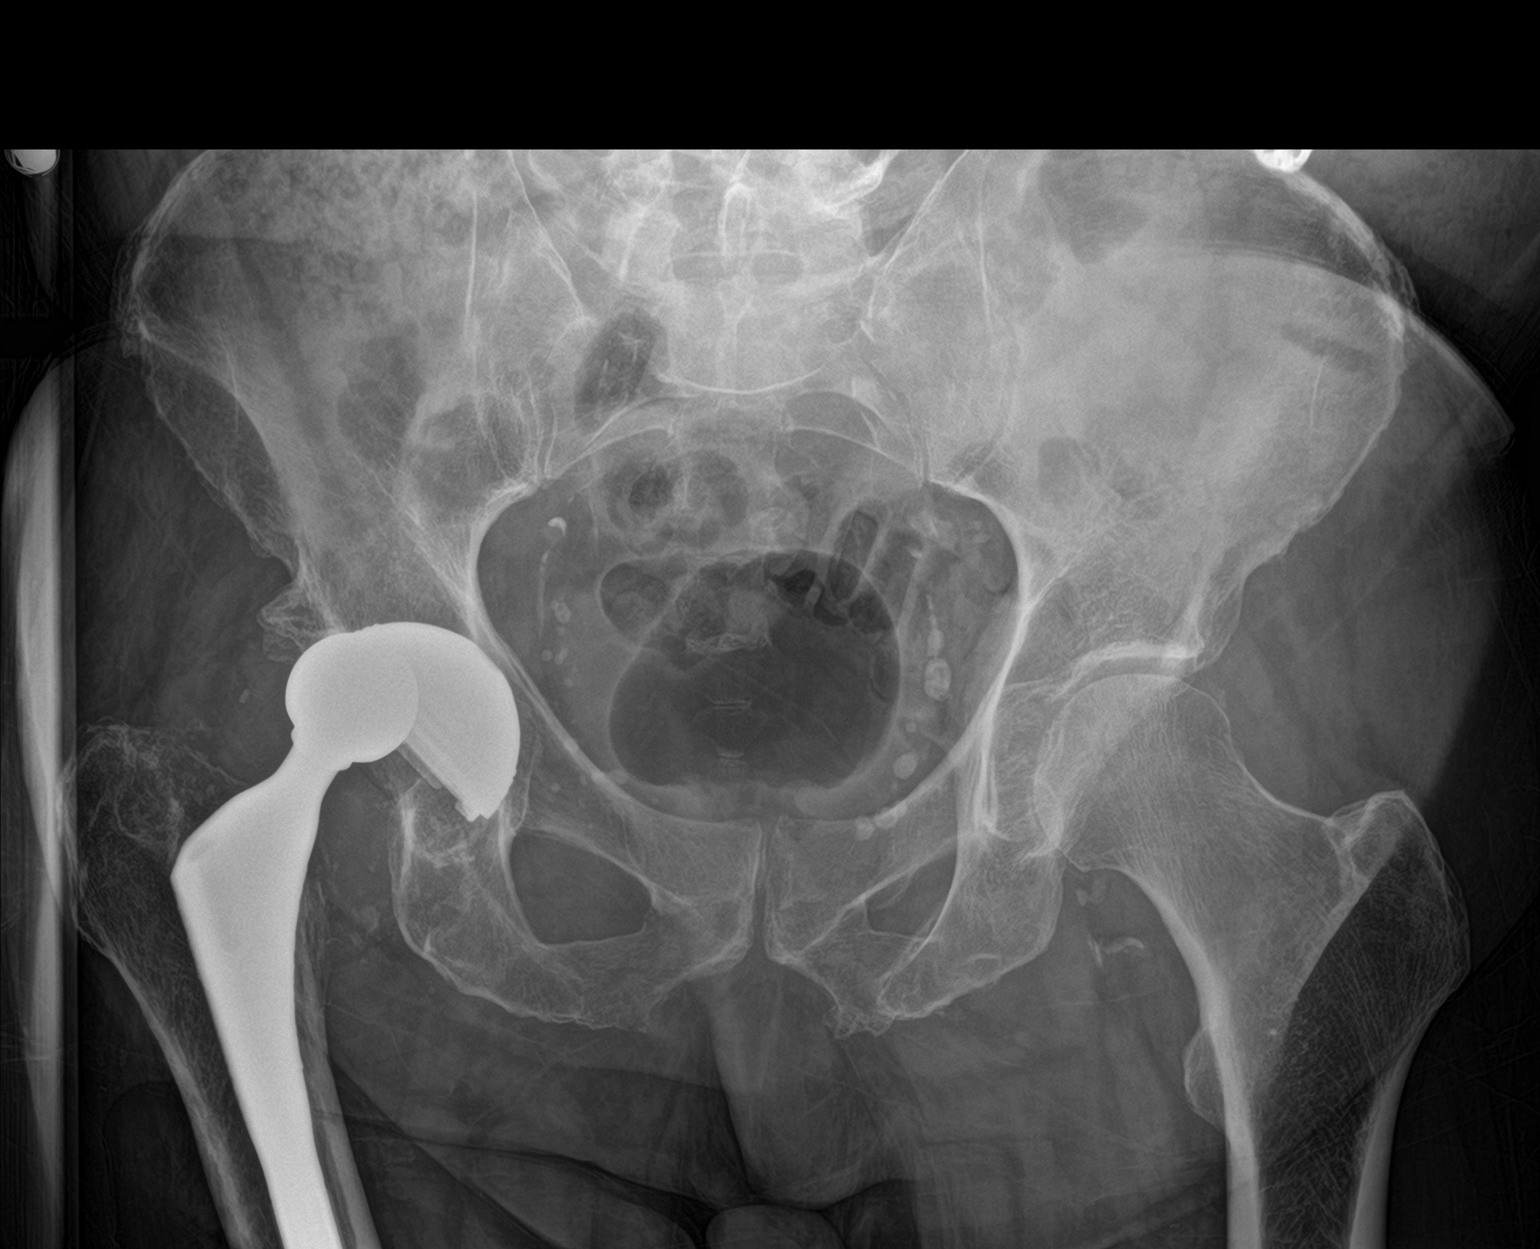

[hip lat]
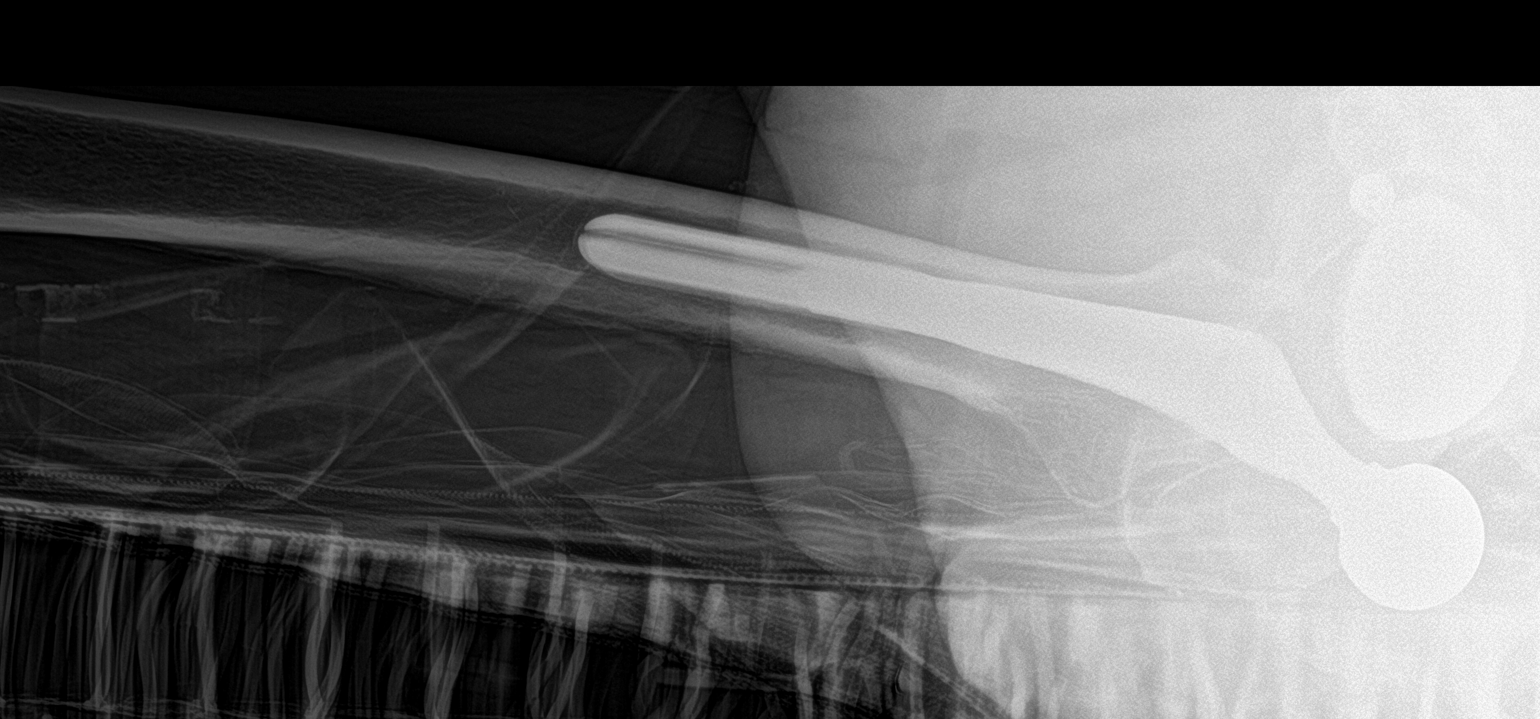

[hip ap]
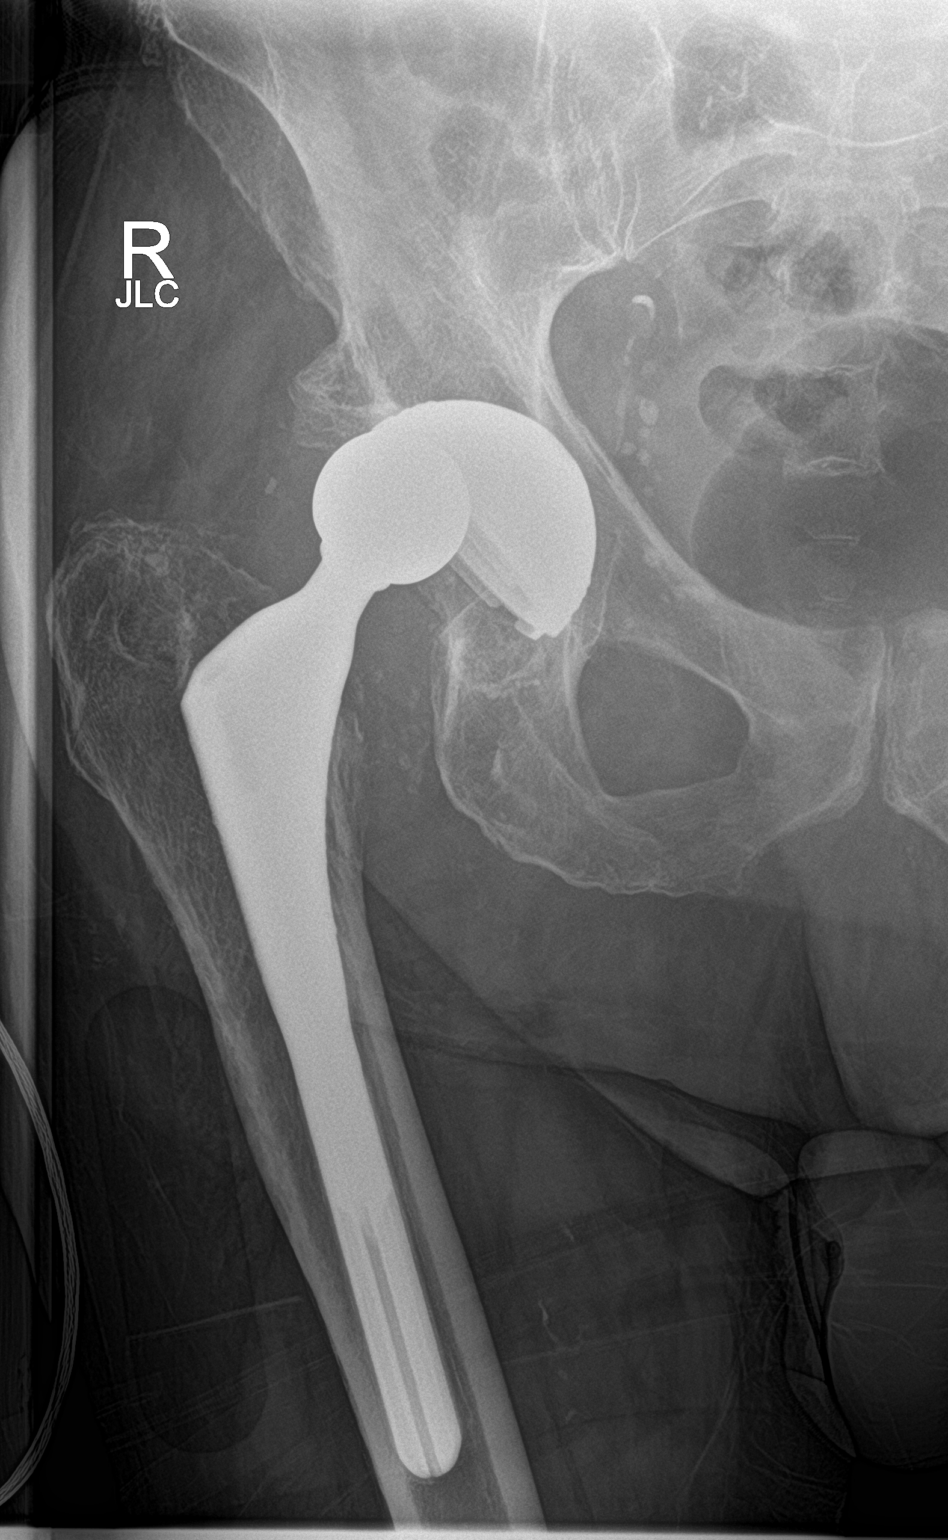

[3 of 3 positions shown; findings below may reference images not displayed]

FINDINGS: Right total hip arthroplasty. No acute fracture. Posterior right hip
dislocation.

No left hip fracture or dislocation.

Generalized osteopenia.
IMPRESSION: Right total hip arthroplasty with posterior dislocation.

## 2020-07-15 DIAGNOSIS — G894 Chronic pain syndrome: Secondary | ICD-10-CM | POA: Diagnosis not present

## 2020-07-15 DIAGNOSIS — R202 Paresthesia of skin: Secondary | ICD-10-CM | POA: Diagnosis not present

## 2020-07-16 DIAGNOSIS — R202 Paresthesia of skin: Secondary | ICD-10-CM | POA: Diagnosis not present

## 2020-07-16 DIAGNOSIS — G894 Chronic pain syndrome: Secondary | ICD-10-CM | POA: Diagnosis not present

## 2020-07-17 ENCOUNTER — Ambulatory Visit: Payer: Self-pay | Admitting: *Deleted

## 2020-07-22 DIAGNOSIS — G894 Chronic pain syndrome: Secondary | ICD-10-CM | POA: Diagnosis not present

## 2020-07-22 DIAGNOSIS — R202 Paresthesia of skin: Secondary | ICD-10-CM | POA: Diagnosis not present

## 2020-07-23 DIAGNOSIS — E1165 Type 2 diabetes mellitus with hyperglycemia: Secondary | ICD-10-CM | POA: Diagnosis not present

## 2020-07-23 DIAGNOSIS — Z6826 Body mass index (BMI) 26.0-26.9, adult: Secondary | ICD-10-CM | POA: Diagnosis not present

## 2020-07-23 DIAGNOSIS — E785 Hyperlipidemia, unspecified: Secondary | ICD-10-CM | POA: Diagnosis not present

## 2020-07-23 DIAGNOSIS — Z23 Encounter for immunization: Secondary | ICD-10-CM | POA: Diagnosis not present

## 2020-07-23 DIAGNOSIS — I2510 Atherosclerotic heart disease of native coronary artery without angina pectoris: Secondary | ICD-10-CM | POA: Diagnosis not present

## 2020-07-23 DIAGNOSIS — Z794 Long term (current) use of insulin: Secondary | ICD-10-CM | POA: Diagnosis not present

## 2020-07-23 DIAGNOSIS — I10 Essential (primary) hypertension: Secondary | ICD-10-CM | POA: Diagnosis not present

## 2020-07-23 DIAGNOSIS — E1140 Type 2 diabetes mellitus with diabetic neuropathy, unspecified: Secondary | ICD-10-CM | POA: Diagnosis not present

## 2020-07-24 DIAGNOSIS — R202 Paresthesia of skin: Secondary | ICD-10-CM | POA: Diagnosis not present

## 2020-07-24 DIAGNOSIS — G894 Chronic pain syndrome: Secondary | ICD-10-CM | POA: Diagnosis not present

## 2020-07-28 ENCOUNTER — Encounter: Payer: Self-pay | Admitting: Orthopedic Surgery

## 2020-07-28 ENCOUNTER — Ambulatory Visit (INDEPENDENT_AMBULATORY_CARE_PROVIDER_SITE_OTHER): Payer: Medicare Other | Admitting: Orthopedic Surgery

## 2020-07-28 VITALS — Ht 70.0 in | Wt 178.0 lb

## 2020-07-28 DIAGNOSIS — I2510 Atherosclerotic heart disease of native coronary artery without angina pectoris: Secondary | ICD-10-CM

## 2020-07-28 DIAGNOSIS — I998 Other disorder of circulatory system: Secondary | ICD-10-CM

## 2020-07-28 NOTE — Progress Notes (Signed)
Office Visit Note   Patient: Keith Espinoza           Date of Birth: [birth year]           MRN: [id number] Visit Date: 07/28/2020              Requested by: [name], MD [address] [place] [address] [place],  [place] [address] PCP: [name], MD  Chief Complaint  Patient presents with  . Right Foot - Pain      HPI: Patient is a 79 year old gentleman who is seen with complaints of right foot pain he has ischemic ulcers over the tips of his toes as well as laterally.  Patient states he does not know what started this.  He states he has been going to [facility] for B12 injectio vascular vein surgery in the past.  Ns in his foot at the pain clinic to treat neuropathic pain.  Patient states he does have extensive cardiac disease and has had 6 cardiac stents placed he states he has seen Dr.Dickson and  Assessment & Plan: Visit Diagnoses:  1. Ischemic foot     Plan: We will make an urgent referral to vascular vein surgery for a vascular work-up.  Has critical limb ischemia.  Follow-Up Instructions: Return if symptoms worsen or fail to improve.   Ortho Exam  Patient is alert, oriented, no adenopathy, well-dressed, normal affect, normal respiratory effort. Examination patient has ischemic ulcers over the tips of the third toe and great toe with an ischemic ulcer over the fifth metatarsal head laterally on the right foot.  Doppler was used he has a dampened monophasic pulse which appears represent every other heartbeat.  Patient denies a history of A. fib.  Patient does not have a palpable dorsalis pedis.  Patient is diabetic he is on Plavix for his coronary artery disease he is on Neurontin for neuropathy.  Patient does have a history of tobacco use.  Imaging: No results found. No images are attached to the encounter.  Labs: Lab Results  Component Value Date   HGBA1C 6.6 (H) 04/10/2019   HGBA1C 7.0 (H) 07/14/2016   HGBA1C 6.9 (H) 04/05/2014   REPTSTATUS 04/18/2019 FINAL  04/13/2019   GRAMSTAIN  04/13/2019    RARE WBC PRESENT, PREDOMINANTLY PMN NO ORGANISMS SEEN    CULT  04/13/2019    No growth aerobically or anaerobically. Performed at [facility], [place] [address], [place], [place] [address]      Lab Results  Component Value Date   ALBUMIN 4.4 04/10/2019   ALBUMIN 4.2 07/14/2016   ALBUMIN 4.4 11/15/2011    No results found for: MG No results found for: VD25OH  No results found for: PREALBUMIN CBC EXTENDED Latest Ref Rng & Units 04/15/2019 04/14/2019 04/10/2019  WBC 4.0 - 10.5 K/uL 8.4 5.5 5.8  RBC 4.22 - 5.81 MIL/uL 2.99(L) 3.15(L) 4.34  HGB 13.0 - 17.0 g/dL 9.6(L) 10.2(L) 13.9  HCT 39 - 52 % 27.5(L) 29.7(L) 40.1  PLT 150 - 400 K/uL 119(L) 98(L) 138(L)  NEUTROABS 1.7 - 7.7 K/uL - - -  LYMPHSABS 0.7 - 4.0 K/uL - - -     Body mass index is 25.54 kg/m.  Orders:  Orders Placed This Encounter  Procedures  . Ambulatory referral to Vascular Surgery   No orders of the defined types were placed in this encounter.    Procedures: No procedures performed  Clinical Data: No additional findings.  ROS:  All other systems negative, except  as noted in the HPI. Review of Systems  Objective: Vital Signs: Ht 5\' 10"  (1.778 m)   Wt 178 lb (80.7 kg)   BMI 25.54 kg/m   Specialty Comments:  No specialty comments available.  PMFS History: Patient Active Problem List   Diagnosis Date Noted  . 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/2019  . Hip dislocation, right ([place]) 04/13/2019  . Other intervertebral disc degeneration, lumbar region 03/30/2019  . CAD (coronary artery disease) 01/30/2019  . Tobacco abuse 01/30/2019  . 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  . Burn, foot, second degree, left, initial encounter 06/08/2017  . 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 S/P total knee arthroplasty, left 10/26/2016  . Hyperlipidemia 09/04/2014  . Thrombocytopenia ([place])   . Precordial chest pain 04/05/2014  .  Coronary atherosclerosis of native coronary artery 10/01/2013  . Other and unspecified hyperlipidemia 10/01/2013  . Essential hypertension, benign 10/01/2013  . Insulin dependent type 2 diabetes mellitus ([place]) 10/01/2013  . Esophageal reflux 10/01/2013  . Hypertrophy of prostate without urinary obstruction and other lower urinary tract symptoms (LUTS) 10/01/2013   Past Medical History:  Diagnosis Date  . Allergic rhinitis   . Allergic rhinitis   . Arthritis   . Basal cell carcinoma 11/01/2019    bcc left chest treatment [place] cx3 54fu   . Chronic leg pain    right  . Chronic lower back pain   . Coronary artery disease    a. Stenting to RCA 2004; staged DES to LAD and Cx 2004. DES to mRCA 2012. b. DES to mCx, PTCA to dCx 11/2011. c. Lateral wall MI 2013 s/p PTCA to distal Cx & DES to mid OM2 11/2011. d. Low risk nuc 04/2014, EF wnl.  . Diabetes mellitus    Insulin dependent  . Diabetic neuropathy (HCC)    MILD  . Diverticulosis   . Dysrhythmia   . Gilbert syndrome   . Gout    right wrist; right foot; right elbow; have had it since 1970's  . H/O hiatal hernia   . Heart murmur   . History of echocardiogram    aortic sclerosis per echo 12/09 EF 65%, otherwise normal  . History of hemorrhoids    BLEEDING  . History of kidney stones    h/o  . Hypertension    Diagnosed 1995   . Pancreatic pseudocyst    a. s/p remote drainage 2006.  Marland Kitchen Thrombocytopenia ([place])    Seen on oldest labs in system from 2004  . Vitamin B 12 deficiency    orally replaced    Family History  Problem Relation Age of Onset  . Diabetes Mother   . Hyperlipidemia Mother   . Hypertension Mother   . Cancer Father   . Hypertension Father   . Diabetes Sister   . Hypertension Sister   . Cancer Brother   . Heart attack Neg Hx     Past Surgical History:  Procedure Laterality Date  . BACK SURGERY     "total of 3 times" S/P fall   . CARPAL TUNNEL RELEASE Bilateral   . CHOLECYSTECTOMY  1990's  . COLONOSCOPY     . CORONARY ANGIOPLASTY  11/11/11  . CORONARY ANGIOPLASTY WITH STENT PLACEMENT  09/30/2011   "1 then; makes a total of 4"  . CORONARY ANGIOPLASTY WITH STENT PLACEMENT  11/11/11   "1; makes a total of 5"  . INGUINAL HERNIA REPAIR  2003   right  . JOINT REPLACEMENT Right 04/03/2002  hip replacment  . KNEE ARTHROSCOPY  1990's   left  . LEFT HEART CATHETERIZATION WITH CORONARY ANGIOGRAM N/A 09/30/2011   Procedure: LEFT HEART CATHETERIZATION WITH CORONARY ANGIOGRAM;  Surgeon: Jettie Booze, MD;  Location: [facility] CATH LAB;  Service: Cardiovascular;  Laterality: N/A;  possible PCI  . LEFT HEART CATHETERIZATION WITH CORONARY ANGIOGRAM N/A 11/15/2011   Procedure: LEFT HEART CATHETERIZATION WITH CORONARY ANGIOGRAM;  Surgeon: Jettie Booze, MD;  Location: [facility] CATH LAB;  Service: Cardiovascular;  Laterality: N/A;  . PERCUTANEOUS CORONARY STENT INTERVENTION (PCI-S)  09/30/2011   Procedure: PERCUTANEOUS CORONARY STENT INTERVENTION (PCI-S);  Surgeon: Jettie Booze, MD;  Location: [facility] CATH LAB;  Service: Cardiovascular;;  . PERCUTANEOUS CORONARY STENT INTERVENTION (PCI-S) N/A 11/11/2011   Procedure: PERCUTANEOUS CORONARY STENT INTERVENTION (PCI-S);  Surgeon: Jettie Booze, MD;  Location: [facility] CATH LAB;  Service: Cardiovascular;  Laterality: N/A;  . SHOULDER SURGERY Right    X 2  . TONSILLECTOMY  ~ 1948  . TOTAL HIP REVISION Right 04/13/2019   Procedure: RIGHT TOTAL HIP REVISION-POSTERIOR  APPROACH LATERAL;  Surgeon: Marybelle Killings, MD;  Location: [place] [facility];  Service: Orthopedics;  Laterality: Right;  . TOTAL KNEE ARTHROPLASTY Left 07/23/2016   Procedure: LEFT TOTAL KNEE ARTHROPLASTY;  Surgeon: Marybelle Killings, MD;  Location: [place];  Service: Orthopedics;  Laterality: Left;   Social History   Occupational History  . Occupation: Retired  Tobacco Use  . Smoking status: Former Research scientist (life sciences)  . Smokeless tobacco: Current User    Types: Chew  Vaping Use  . Vaping Use: Never used  Substance and Sexual  Activity  . Alcohol use: No  . Drug use: No  . Sexual activity: Not Currently

## 2020-07-29 ENCOUNTER — Other Ambulatory Visit: Payer: Self-pay

## 2020-07-29 DIAGNOSIS — M79671 Pain in right foot: Secondary | ICD-10-CM

## 2020-07-29 DIAGNOSIS — G894 Chronic pain syndrome: Secondary | ICD-10-CM | POA: Diagnosis not present

## 2020-07-29 DIAGNOSIS — R202 Paresthesia of skin: Secondary | ICD-10-CM | POA: Diagnosis not present

## 2020-07-30 ENCOUNTER — Ambulatory Visit (INDEPENDENT_AMBULATORY_CARE_PROVIDER_SITE_OTHER): Payer: Medicare Other | Admitting: Vascular Surgery

## 2020-07-30 ENCOUNTER — Other Ambulatory Visit: Payer: Self-pay

## 2020-07-30 ENCOUNTER — Encounter: Payer: Self-pay | Admitting: Vascular Surgery

## 2020-07-30 ENCOUNTER — Ambulatory Visit (HOSPITAL_COMMUNITY)
Admission: RE | Admit: 2020-07-30 | Discharge: 2020-07-30 | Disposition: A | Discharge status: Home / Self Care | Payer: Medicare Other | Source: Ambulatory Visit | Attending: Vascular Surgery | Admitting: Vascular Surgery

## 2020-07-30 VITALS — BP 114/70 | HR 68 | Temp 97.3°F | Resp 20 | Ht 70.0 in | Wt 165.0 lb

## 2020-07-30 DIAGNOSIS — I998 Other disorder of circulatory system: Secondary | ICD-10-CM

## 2020-07-30 DIAGNOSIS — I70299 Other atherosclerosis of native arteries of extremities, unspecified extremity: Secondary | ICD-10-CM

## 2020-07-30 DIAGNOSIS — L97909 Non-pressure chronic ulcer of unspecified part of unspecified lower leg with unspecified severity: Secondary | ICD-10-CM | POA: Diagnosis not present

## 2020-07-30 DIAGNOSIS — M79671 Pain in right foot: Secondary | ICD-10-CM | POA: Diagnosis not present

## 2020-07-30 NOTE — H&P (View-Only) (Signed)
REASON FOR CONSULT:    Ischemic right foot.  The consult is requested by Dr. Meridee Score.  ASSESSMENT & PLAN:   CRITICAL LIMB ISCHEMIA RIGHT LOWER EXTREMITY: This patient has evidence of severe tibial artery occlusive disease on the right and has ulcers of the right foot as documented below.  Given his ischemia, diabetes, and nonhealing wounds, this is clearly a limb threatening situation.  I have recommended that we proceed with arteriography to see what options we have for revascularization.  I think without revascularization these wounds will progress and he would require an amputation.  I have reviewed with the patient the indications for arteriography. In addition, I have reviewed the potential complications of arteriography including but not limited to: Bleeding, arterial injury, arterial thrombosis, dye action, renal insufficiency, or other unpredictable medical problems. I have explained to the patient that if we find disease amenable to angioplasty we could potentially address this at the same time. I have discussed the potential complications of angioplasty and stenting, including but not limited to: Bleeding, arterial thrombosis, arterial injury, dissection, or the need for surgical intervention.  His arteriogram is scheduled for Friday, November 5.  Fortunately he is not a smoker.  He is on aspirin, Plavix, and a statin.  [name], MD Office: [phone]   HPI:   Keith Espinoza is a pleasant 79 y.o. male, who is referred with peripheral vascular disease.  I have reviewed the records from the referring office.  The patient was seen on 07/28/2020 with right foot pain and ischemic ulcers over the tips of his toes and also on his lateral foot.  He was sent for vascular consultation.  On my history, the patient developed wounds on the lateral aspect of his right foot and on the third toe about 2 weeks ago.  These have not shown signs of healing.  He does admit to some right  calf claudication prior to this event.  He does have a history of neuropathy with pain in his feet but I cannot get any clear-cut history of rest pain.  He has been undergoing some injections for his neuropathy with I believe B12 but they have not been sticking the right foot recently because of the concerns for ischemia.  His risk factors for peripheral vascular disease include type 2 diabetes and hypertension.  He denies any history of hypercholesterolemia or family history of premature cardiovascular disease.  He has a remote history of tobacco use.  He has had previous PTCA.  His cardiologist is Dr.Varanasi.  He denies any recent chest pain or chest pressure.  He can walk up a flight of stairs without becoming short of breath.  He was last seen by Dr. Irish Lack in April of this year.  At that time he was doing well from a cardiac standpoint.  Past Medical History:  Diagnosis Date  . Allergic rhinitis   . Allergic rhinitis   . Arthritis   . Basal cell carcinoma 11/01/2019    bcc left chest treatment TX cx3 38fu   . Chronic leg pain    right  . Chronic lower back pain   . Coronary artery disease    a. Stenting to RCA 2004; staged DES to LAD and Cx 2004. DES to mRCA 2012. b. DES to mCx, PTCA to dCx 11/2011. c. Lateral wall MI 2013 s/p PTCA to distal Cx & DES to mid OM2 11/2011. d. Low risk nuc 04/2014, EF wnl.  . Diabetes mellitus    Insulin  dependent  . Diabetic neuropathy (HCC)    MILD  . Diverticulosis   . Dysrhythmia   . Gilbert syndrome   . Gout    right wrist; right foot; right elbow; have had it since 1970's  . H/O hiatal hernia   . Heart murmur   . History of echocardiogram    aortic sclerosis per echo 12/09 EF 65%, otherwise normal  . History of hemorrhoids    BLEEDING  . History of kidney stones    h/o  . Hypertension    Diagnosed 1995   . Pancreatic pseudocyst    a. s/p remote drainage 2006.  Marland Kitchen Thrombocytopenia ([place])    Seen on oldest labs in system from 2004  .  Vitamin B 12 deficiency    orally replaced    Family History  Problem Relation Age of Onset  . Diabetes Mother   . Hyperlipidemia Mother   . Hypertension Mother   . Cancer Father   . Hypertension Father   . Diabetes Sister   . Hypertension Sister   . Cancer Brother   . Heart attack Neg Hx     SOCIAL HISTORY: Social History   Socioeconomic History  . Marital status: Married    Spouse name: Not on file  . Number of children: Not on file  . Years of education: Not on file  . Highest education level: Not on file  Occupational History  . Occupation: Retired  Tobacco Use  . Smoking status: Former Research scientist (life sciences)  . Smokeless tobacco: Current User    Types: Chew  Vaping Use  . Vaping Use: Never used  Substance and Sexual Activity  . Alcohol use: No  . Drug use: No  . Sexual activity: Not Currently  Other Topics Concern  . Not on file  Social History Narrative  . Not on file   Social Determinants of Health   Financial Resource Strain:   . Difficulty of Paying Living Expenses: Not on file  Food Insecurity: No Food Insecurity  . Worried About Charity fundraiser in the Last Year: Never true  . Ran Out of Food in the Last Year: Never true  Transportation Needs: No Transportation Needs  . Lack of Transportation (Medical): No  . Lack of Transportation (Non-Medical): No  Physical Activity:   . Days of Exercise per Week: Not on file  . Minutes of Exercise per Session: Not on file  Stress:   . Feeling of Stress : Not on file  Social Connections:   . Frequency of Communication with Friends and Family: Not on file  . Frequency of Social Gatherings with Friends and Family: Not on file  . Attends Religious Services: Not on file  . Active Member of Clubs or Organizations: Not on file  . Attends Archivist Meetings: Not on file  . Marital Status: Not on file  Intimate Partner Violence:   . Fear of Current or Ex-Partner: Not on file  . Emotionally Abused: Not on file  .  Physically Abused: Not on file  . Sexually Abused: Not on file    Allergies  Allergen Reactions  . Simvastatin Other (See Comments)    SEVERE MYALGIAS   . Zetia [Ezetimibe] Other (See Comments)    MYALGIAS  . Dilaudid [Hydromorphone Hcl]     hallucination  . Fish Oil Nausea Only    Current Outpatient Medications  Medication Sig Dispense Refill  . allopurinol (ZYLOPRIM) 300 MG tablet Take 300 mg by mouth daily.     Marland Kitchen  amLODipine (NORVASC) 2.5 MG tablet Take 2.5 mg by mouth daily.    Marland Kitchen aspirin EC 81 MG tablet Take 81 mg by mouth daily.    . Cholecalciferol (VITAMIN D3) 50 MCG (2000 UT) TABS Take 2,000 Units by mouth daily.     . clopidogrel (PLAVIX) 75 MG tablet TAKE ONE TABLET BY MOUTH ONE TIME DAILY 30 tablet 5  . empagliflozin (JARDIANCE) 25 MG TABS tablet Take 25 mg by mouth daily.    . fluorouracil (EFUDEX) 5 % cream Apply topically in the morning and at bedtime. 40 g 1  . gabapentin (NEURONTIN) 300 MG capsule Take 600 mg by mouth 2 (two) times daily.   3  . hydrochlorothiazide (MICROZIDE) 12.5 MG capsule Take 12.5 mg by mouth daily.   2  . insulin detemir (LEVEMIR) 100 UNIT/ML injection Inject 12-30.5 Units into the skin at bedtime.     . isosorbide mononitrate (IMDUR) 30 MG 24 hr tablet TAKE 1 TABLET BY MOUTH EVERY DAY 90 tablet 2  . KLOR-CON M20 20 MEQ tablet Take 20 mEq by mouth daily.   3  . losartan (COZAAR) 50 MG tablet Take 50 mg by mouth daily.  2  . metFORMIN (GLUCOPHAGE) 1000 MG tablet Take 1 tablet (1,000 mg total) by mouth 2 (two) times daily.    . nitroGLYCERIN (NITROSTAT) 0.4 MG SL tablet Place 0.4 mg under the tongue every 5 (five) minutes as needed for chest pain.    . ONE TOUCH ULTRA TEST test strip CHECK BLOOD SUGAR ONCE DAILY AS DIRECTED  5  . rosuvastatin (CRESTOR) 10 MG tablet Take 1 tablet (10 mg total) by mouth once a week. 15 tablet 3  . silver sulfADIAZINE (SILVADENE) 1 % cream Apply topically.    . terazosin (HYTRIN) 5 MG capsule Take 5 mg by mouth at  bedtime.     Marland Kitchen glimepiride (AMARYL) 4 MG tablet Take 4 mg by mouth 2 (two) times daily. (Patient not taking: Reported on 07/30/2020)    . HYDROcodone-acetaminophen (NORCO) 5-325 MG tablet Take 1 tablet by mouth every 4 (four) hours as needed for moderate pain. Post hip revision pain (Patient not taking: Reported on 07/30/2020) 30 tablet 0   No current facility-administered medications for this visit.    REVIEW OF SYSTEMS:  [X]  denotes positive finding, [ ]  denotes negative finding Cardiac  Comments:  Chest pain or chest pressure:    Shortness of breath upon exertion:    Short of breath when lying flat:    Irregular heart rhythm:        Vascular    Pain in calf, thigh, or hip brought on by ambulation: x   Pain in feet at night that wakes you up from your sleep:  x   Blood clot in your veins:    Leg swelling:         Pulmonary    Oxygen at home:    Productive cough:     Wheezing:         Neurologic    Sudden weakness in arms or legs:     Sudden numbness in arms or legs:     Sudden onset of difficulty speaking or slurred speech:    Temporary loss of vision in one eye:     Problems with dizziness:         Gastrointestinal    Blood in stool:     Vomited blood:         Genitourinary    Burning when  urinating:     Blood in urine:        Psychiatric    Major depression:         Hematologic    Bleeding problems:    Problems with blood clotting too easily:        Skin    Rashes or ulcers:        Constitutional    Fever or chills:     PHYSICAL EXAM:   Vitals:   07/30/20 0927  BP: 114/70  Pulse: 68  Resp: 20  Temp: (!) 97.3 F (36.3 C)  SpO2: 96%  Weight: 165 lb (74.8 kg)  Height: 5\' 10"  (1.778 m)    GENERAL: The patient is a well-nourished male, in no acute distress. The vital signs are documented above. CARDIAC: There is a regular rate and rhythm.  VASCULAR: I do not detect carotid bruits. He has palpable femoral and popliteal pulses bilaterally. I cannot  palpate pedal pulses. PULMONARY: There is good air exchange bilaterally without wheezing or rales. ABDOMEN: Soft and non-tender with normal pitched bowel sounds.  MUSCULOSKELETAL: There are no major deformities or cyanosis. NEUROLOGIC: No focal weakness or paresthesias are detected. SKIN: There are no ulcers or rashes noted. PSYCHIATRIC: The patient has a normal affect.  DATA:    ARTERIAL DOPPLER STUDY: I have independently interpreted his arterial Doppler study today.  On the right side he has a dampened monophasic dorsalis pedis and posterior tibial signal.  ABIs 35%.  Toe pressures 0.  On the left side he has a monophasic dorsalis pedis and posterior tibial signal.  ABIs 100% although this is likely falsely elevated.  Toe pressure 61 mmHg.  LABS: The last labs I have are from July 2020 when his GFR was greater than 60.

## 2020-07-30 NOTE — Progress Notes (Signed)
REASON FOR CONSULT:    Ischemic right foot.  The consult is requested by Dr. Meridee Score.  ASSESSMENT & PLAN:   CRITICAL LIMB ISCHEMIA RIGHT LOWER EXTREMITY: This patient has evidence of severe tibial artery occlusive disease on the right and has ulcers of the right foot as documented below.  Given his ischemia, diabetes, and nonhealing wounds, this is clearly a limb threatening situation.  I have recommended that we proceed with arteriography to see what options we have for revascularization.  I think without revascularization these wounds will progress and he would require an amputation.  I have reviewed with the patient the indications for arteriography. In addition, I have reviewed the potential complications of arteriography including but not limited to: Bleeding, arterial injury, arterial thrombosis, dye action, renal insufficiency, or other unpredictable medical problems. I have explained to the patient that if we find disease amenable to angioplasty we could potentially address this at the same time. I have discussed the potential complications of angioplasty and stenting, including but not limited to: Bleeding, arterial thrombosis, arterial injury, dissection, or the need for surgical intervention.  His arteriogram is scheduled for Friday, November 5.  Fortunately he is not a smoker.  He is on aspirin, Plavix, and a statin.  Keith Mayo, MD Office: [phone]   HPI:   Keith Espinoza is a pleasant 79 y.o. male, who is referred with peripheral vascular disease.  I have reviewed the records from the referring office.  The patient was seen on 07/28/2020 with right foot pain and ischemic ulcers over the tips of his toes and also on his lateral foot.  He was sent for vascular consultation.  On my history, the patient developed wounds on the lateral aspect of his right foot and on the third toe about 2 weeks ago.  These have not shown signs of healing.  He does admit to some right  calf claudication prior to this event.  He does have a history of neuropathy with pain in his feet but I cannot get any clear-cut history of rest pain.  He has been undergoing some injections for his neuropathy with I believe B12 but they have not been sticking the right foot recently because of the concerns for ischemia.  His risk factors for peripheral vascular disease include type 2 diabetes and hypertension.  He denies any history of hypercholesterolemia or family history of premature cardiovascular disease.  He has a remote history of tobacco use.  He has had previous PTCA.  His cardiologist is Dr.Varanasi.  He denies any recent chest pain or chest pressure.  He can walk up a flight of stairs without becoming short of breath.  He was last seen by Dr. Irish Lack in April of this year.  At that time he was doing well from a cardiac standpoint.  Past Medical History:  Diagnosis Date  . Allergic rhinitis   . Allergic rhinitis   . Arthritis   . Basal cell carcinoma 11/01/2019    bcc left chest treatment [place] [id number]   . Chronic leg pain    right  . Chronic lower back pain   . Coronary artery disease    a. Stenting to RCA 2004; staged DES to LAD and Cx 2004. DES to mRCA 2012. b. DES to mCx, PTCA to dCx 11/2011. c. Lateral wall MI 2013 s/p PTCA to distal Cx & DES to mid OM2 11/2011. d. Low risk nuc 04/2014, EF wnl.  . Diabetes mellitus    Insulin  dependent  . Diabetic neuropathy (HCC)    MILD  . Diverticulosis   . Dysrhythmia   . Gilbert syndrome   . Gout    right wrist; right foot; right elbow; have had it since 1970's  . H/O hiatal hernia   . Heart murmur   . History of echocardiogram    aortic sclerosis per echo 12/09 EF 65%, otherwise normal  . History of hemorrhoids    BLEEDING  . History of kidney stones    h/o  . Hypertension    Diagnosed 1995   . Pancreatic pseudocyst    a. s/p remote drainage 2006.  Marland Kitchen Thrombocytopenia ([place])    Seen on oldest labs in system from 2004  .  Vitamin B 12 deficiency    orally replaced    Family History  Problem Relation Age of Onset  . Diabetes Mother   . Hyperlipidemia Mother   . Hypertension Mother   . Cancer Father   . Hypertension Father   . Diabetes Sister   . Hypertension Sister   . Cancer Brother   . Heart attack Neg Hx     SOCIAL HISTORY: Social History   Socioeconomic History  . Marital status: Married    Spouse name: Not on file  . Number of children: Not on file  . Years of education: Not on file  . Highest education level: Not on file  Occupational History  . Occupation: Retired  Tobacco Use  . Smoking status: Former Research scientist (life sciences)  . Smokeless tobacco: Current User    Types: Chew  Vaping Use  . Vaping Use: Never used  Substance and Sexual Activity  . Alcohol use: No  . Drug use: No  . Sexual activity: Not Currently  Other Topics Concern  . Not on file  Social History Narrative  . Not on file   Social Determinants of Health   Financial Resource Strain:   . Difficulty of Paying Living Expenses: Not on file  Food Insecurity: No Food Insecurity  . Worried About Charity fundraiser in the Last Year: Never true  . Ran Out of Food in the Last Year: Never true  Transportation Needs: No Transportation Needs  . Lack of Transportation (Medical): No  . Lack of Transportation (Non-Medical): No  Physical Activity:   . Days of Exercise per Week: Not on file  . Minutes of Exercise per Session: Not on file  Stress:   . Feeling of Stress : Not on file  Social Connections:   . Frequency of Communication with Friends and Family: Not on file  . Frequency of Social Gatherings with Friends and Family: Not on file  . Attends Religious Services: Not on file  . Active Member of Clubs or Organizations: Not on file  . Attends Archivist Meetings: Not on file  . Marital Status: Not on file  Intimate Partner Violence:   . Fear of Current or Ex-Partner: Not on file  . Emotionally Abused: Not on file  .  Physically Abused: Not on file  . Sexually Abused: Not on file    Allergies  Allergen Reactions  . Simvastatin Other (See Comments)    SEVERE MYALGIAS   . Zetia [Ezetimibe] Other (See Comments)    MYALGIAS  . Dilaudid [Hydromorphone Hcl]     hallucination  . Fish Oil Nausea Only    Current Outpatient Medications  Medication Sig Dispense Refill  . allopurinol (ZYLOPRIM) 300 MG tablet Take 300 mg by mouth daily.     Marland Kitchen  amLODipine (NORVASC) 2.5 MG tablet Take 2.5 mg by mouth daily.    Marland Kitchen aspirin EC 81 MG tablet Take 81 mg by mouth daily.    . Cholecalciferol (VITAMIN D3) 50 MCG (2000 UT) TABS Take 2,000 Units by mouth daily.     . clopidogrel (PLAVIX) 75 MG tablet TAKE ONE TABLET BY MOUTH ONE TIME DAILY 30 tablet 5  . empagliflozin (JARDIANCE) 25 MG TABS tablet Take 25 mg by mouth daily.    . fluorouracil (EFUDEX) 5 % cream Apply topically in the morning and at bedtime. 40 g 1  . gabapentin (NEURONTIN) 300 MG capsule Take 600 mg by mouth 2 (two) times daily.   3  . hydrochlorothiazide (MICROZIDE) 12.5 MG capsule Take 12.5 mg by mouth daily.   2  . insulin detemir (LEVEMIR) 100 UNIT/ML injection Inject 12-30.5 Units into the skin at bedtime.     . isosorbide mononitrate (IMDUR) 30 MG 24 hr tablet TAKE 1 TABLET BY MOUTH EVERY DAY 90 tablet 2  . KLOR-CON M20 20 MEQ tablet Take 20 mEq by mouth daily.   3  . losartan (COZAAR) 50 MG tablet Take 50 mg by mouth daily.  2  . metFORMIN (GLUCOPHAGE) 1000 MG tablet Take 1 tablet (1,000 mg total) by mouth 2 (two) times daily.    . nitroGLYCERIN (NITROSTAT) 0.4 MG SL tablet Place 0.4 mg under the tongue every 5 (five) minutes as needed for chest pain.    . ONE TOUCH ULTRA TEST test strip CHECK BLOOD SUGAR ONCE DAILY AS DIRECTED  5  . rosuvastatin (CRESTOR) 10 MG tablet Take 1 tablet (10 mg total) by mouth once a week. 15 tablet 3  . silver sulfADIAZINE (SILVADENE) 1 % cream Apply topically.    . terazosin (HYTRIN) 5 MG capsule Take 5 mg by mouth at  bedtime.     Marland Kitchen glimepiride (AMARYL) 4 MG tablet Take 4 mg by mouth 2 (two) times daily. (Patient not taking: Reported on 07/30/2020)    . HYDROcodone-acetaminophen (NORCO) 5-325 MG tablet Take 1 tablet by mouth every 4 (four) hours as needed for moderate pain. Post hip revision pain (Patient not taking: Reported on 07/30/2020) 30 tablet 0   No current facility-administered medications for this visit.    REVIEW OF SYSTEMS:  [X]  denotes positive finding, [ ]  denotes negative finding Cardiac  Comments:  Chest pain or chest pressure:    Shortness of breath upon exertion:    Short of breath when lying flat:    Irregular heart rhythm:        Vascular    Pain in calf, thigh, or hip brought on by ambulation: x   Pain in feet at night that wakes you up from your sleep:  x   Blood clot in your veins:    Leg swelling:         Pulmonary    Oxygen at home:    Productive cough:     Wheezing:         Neurologic    Sudden weakness in arms or legs:     Sudden numbness in arms or legs:     Sudden onset of difficulty speaking or slurred speech:    Temporary loss of vision in one eye:     Problems with dizziness:         Gastrointestinal    Blood in stool:     Vomited blood:         Genitourinary    Burning when  urinating:     Blood in urine:        Psychiatric    Major depression:         Hematologic    Bleeding problems:    Problems with blood clotting too easily:        Skin    Rashes or ulcers:        Constitutional    Fever or chills:     PHYSICAL EXAM:   Vitals:   07/30/20 0927  BP: 114/70  Pulse: 68  Resp: 20  Temp: (!) 97.3 F (36.3 C)  SpO2: 96%  Weight: 165 lb (74.8 kg)  Height: 5\' 10"  (1.778 m)    GENERAL: The patient is a well-nourished male, in no acute distress. The vital signs are documented above. CARDIAC: There is a regular rate and rhythm.  VASCULAR: I do not detect carotid bruits. He has palpable femoral and popliteal pulses bilaterally. I cannot  palpate pedal pulses. PULMONARY: There is good air exchange bilaterally without wheezing or rales. ABDOMEN: Soft and non-tender with normal pitched bowel sounds.  MUSCULOSKELETAL: There are no major deformities or cyanosis. NEUROLOGIC: No focal weakness or paresthesias are detected. SKIN: There are no ulcers or rashes noted. PSYCHIATRIC: The patient has a normal affect.  DATA:    ARTERIAL DOPPLER STUDY: I have independently interpreted his arterial Doppler study today.  On the right side he has a dampened monophasic dorsalis pedis and posterior tibial signal.  ABIs 35%.  Toe pressures 0.  On the left side he has a monophasic dorsalis pedis and posterior tibial signal.  ABIs 100% although this is likely falsely elevated.  Toe pressure 61 mmHg.  LABS: The last labs I have are from July 2020 when his GFR was greater than 60.

## 2020-08-05 ENCOUNTER — Other Ambulatory Visit (HOSPITAL_COMMUNITY)
Admission: RE | Admit: 2020-08-05 | Discharge: 2020-08-05 | Disposition: A | Discharge status: Home / Self Care | Payer: Medicare Other | Source: Ambulatory Visit | Attending: Vascular Surgery | Admitting: Vascular Surgery

## 2020-08-05 DIAGNOSIS — Z01812 Encounter for preprocedural laboratory examination: Secondary | ICD-10-CM | POA: Diagnosis not present

## 2020-08-05 DIAGNOSIS — Z20822 Contact with and (suspected) exposure to covid-19: Secondary | ICD-10-CM | POA: Diagnosis not present

## 2020-08-05 LAB — SARS CORONAVIRUS 2 (TAT 6-24 HRS): SARS Coronavirus 2: NEGATIVE

## 2020-08-07 ENCOUNTER — Ambulatory Visit: Payer: Medicare Other | Admitting: Physician Assistant

## 2020-08-08 ENCOUNTER — Encounter (HOSPITAL_COMMUNITY)
Admission: RE | Disposition: A | Discharge status: Home / Self Care | Payer: Self-pay | Source: Home / Self Care | Attending: Vascular Surgery

## 2020-08-08 ENCOUNTER — Ambulatory Visit (HOSPITAL_COMMUNITY)
Admission: RE | Admit: 2020-08-08 | Discharge: 2020-08-08 | Disposition: A | Discharge status: Home / Self Care | Payer: Medicare Other | Attending: Vascular Surgery | Admitting: Vascular Surgery

## 2020-08-08 ENCOUNTER — Encounter: Payer: Self-pay | Admitting: Vascular Surgery

## 2020-08-08 ENCOUNTER — Other Ambulatory Visit: Payer: Self-pay

## 2020-08-08 DIAGNOSIS — I70235 Atherosclerosis of native arteries of right leg with ulceration of other part of foot: Secondary | ICD-10-CM | POA: Diagnosis not present

## 2020-08-08 DIAGNOSIS — E1151 Type 2 diabetes mellitus with diabetic peripheral angiopathy without gangrene: Secondary | ICD-10-CM | POA: Insufficient documentation

## 2020-08-08 DIAGNOSIS — I10 Essential (primary) hypertension: Secondary | ICD-10-CM | POA: Insufficient documentation

## 2020-08-08 DIAGNOSIS — F17290 Nicotine dependence, other tobacco product, uncomplicated: Secondary | ICD-10-CM | POA: Diagnosis not present

## 2020-08-08 DIAGNOSIS — Z888 Allergy status to other drugs, medicaments and biological substances status: Secondary | ICD-10-CM | POA: Insufficient documentation

## 2020-08-08 DIAGNOSIS — I739 Peripheral vascular disease, unspecified: Secondary | ICD-10-CM | POA: Diagnosis not present

## 2020-08-08 DIAGNOSIS — Z885 Allergy status to narcotic agent status: Secondary | ICD-10-CM | POA: Insufficient documentation

## 2020-08-08 DIAGNOSIS — L97519 Non-pressure chronic ulcer of other part of right foot with unspecified severity: Secondary | ICD-10-CM | POA: Insufficient documentation

## 2020-08-08 DIAGNOSIS — E11621 Type 2 diabetes mellitus with foot ulcer: Secondary | ICD-10-CM | POA: Insufficient documentation

## 2020-08-08 HISTORY — PX: PERIPHERAL VASCULAR BALLOON ANGIOPLASTY: CATH118281

## 2020-08-08 HISTORY — PX: ABDOMINAL AORTOGRAM W/LOWER EXTREMITY: CATH118223

## 2020-08-08 LAB — POCT I-STAT, CHEM 8
BUN: 23 mg/dL (ref 8–23)
Calcium, Ion: 1.33 mmol/L (ref 1.15–1.40)
Chloride: 103 mmol/L (ref 98–111)
Creatinine, Ser: 1.2 mg/dL (ref 0.61–1.24)
Glucose, Bld: 138 mg/dL — ABNORMAL HIGH (ref 70–99)
HCT: 33 % — ABNORMAL LOW (ref 39.0–52.0)
Hemoglobin: 11.2 g/dL — ABNORMAL LOW (ref 13.0–17.0)
Potassium: 3.5 mmol/L (ref 3.5–5.1)
Sodium: 144 mmol/L (ref 135–145)
TCO2: 29 mmol/L (ref 22–32)

## 2020-08-08 LAB — POCT ACTIVATED CLOTTING TIME
Activated Clotting Time: 208 seconds
Activated Clotting Time: 230 seconds
Activated Clotting Time: 186 seconds

## 2020-08-08 LAB — GLUCOSE, CAPILLARY: Glucose-Capillary: 73 mg/dL (ref 70–99)

## 2020-08-08 SURGERY — ABDOMINAL AORTOGRAM W/LOWER EXTREMITY
Anesthesia: LOCAL | Laterality: Right

## 2020-08-08 MED ORDER — LIDOCAINE HCL (PF) 1 % IJ SOLN
INTRAMUSCULAR | Status: DC | PRN
  Administered 2020-08-08: 15 mL

## 2020-08-08 MED ORDER — HEPARIN (PORCINE) IN NACL 1000-0.9 UT/500ML-% IV SOLN
INTRAVENOUS | Status: AC
  Filled 2020-08-08: qty 1000

## 2020-08-08 MED ORDER — HEPARIN SODIUM (PORCINE) 1000 UNIT/ML IJ SOLN
INTRAMUSCULAR | Status: AC
  Filled 2020-08-08: qty 1

## 2020-08-08 MED ORDER — SODIUM CHLORIDE 0.9 % WEIGHT BASED INFUSION: 1.0000 mL/kg/h | INTRAVENOUS | Status: DC

## 2020-08-08 MED ORDER — LIDOCAINE HCL (PF) 1 % IJ SOLN
INTRAMUSCULAR | Status: AC
  Filled 2020-08-08: qty 30

## 2020-08-08 MED ORDER — MIDAZOLAM HCL 2 MG/2ML IJ SOLN
INTRAMUSCULAR | Status: AC
  Filled 2020-08-08: qty 2

## 2020-08-08 MED ORDER — IODIXANOL 320 MG/ML IV SOLN
INTRAVENOUS | Status: DC | PRN
  Administered 2020-08-08: 175 mL via INTRA_ARTERIAL

## 2020-08-08 MED ORDER — NITROGLYCERIN 1 MG/10 ML FOR IR/CATH LAB
INTRA_ARTERIAL | Status: AC
  Filled 2020-08-08: qty 10

## 2020-08-08 MED ORDER — FENTANYL CITRATE (PF) 100 MCG/2ML IJ SOLN
INTRAMUSCULAR | Status: AC
  Filled 2020-08-08: qty 2

## 2020-08-08 MED ORDER — HEPARIN SODIUM (PORCINE) 1000 UNIT/ML IJ SOLN
INTRAMUSCULAR | Status: DC | PRN
  Administered 2020-08-08: 1000 [IU] via INTRAVENOUS
  Administered 2020-08-08: 7000 [IU] via INTRAVENOUS

## 2020-08-08 MED ORDER — SODIUM CHLORIDE 0.9 % IV SOLN: INTRAVENOUS | Status: DC

## 2020-08-08 MED ORDER — NITROGLYCERIN 1 MG/10 ML FOR IR/CATH LAB
INTRA_ARTERIAL | Status: DC | PRN
  Administered 2020-08-08 (×2): 200 ug via INTRA_ARTERIAL

## 2020-08-08 MED ORDER — FENTANYL CITRATE (PF) 100 MCG/2ML IJ SOLN
INTRAMUSCULAR | Status: DC | PRN
  Administered 2020-08-08 (×2): 50 ug via INTRAVENOUS

## 2020-08-08 MED ORDER — HEPARIN (PORCINE) IN NACL 1000-0.9 UT/500ML-% IV SOLN
INTRAVENOUS | Status: DC | PRN
  Administered 2020-08-08 (×2): 500 mL

## 2020-08-08 MED ORDER — MIDAZOLAM HCL 2 MG/2ML IJ SOLN
INTRAMUSCULAR | Status: DC | PRN
  Administered 2020-08-08 (×2): 1 mg via INTRAVENOUS

## 2020-08-08 SURGICAL SUPPLY — 20 items
BALLN STERLING OTW 3X220X150 (BALLOONS) ×3
BALLOON STERLING OTW 3X220X150 (BALLOONS) IMPLANT
CATH ANGIO 5F PIGTAIL 65CM (CATHETERS) ×1 IMPLANT
CATH CROSS OVER TEMPO 5F (CATHETERS) ×1 IMPLANT
CATH NAVICROSS ANG 65CM (CATHETERS) IMPLANT
CATH QUICKCROSS ANG SELECT (CATHETERS) ×1 IMPLANT
CATH STRAIGHT 5FR 65CM (CATHETERS) ×1 IMPLANT
CATHETER NAVICROSS ANG 65CM (CATHETERS) ×3
GUIDEWIRE ANGLED .035X150CM (WIRE) ×1 IMPLANT
KIT ENCORE 26 ADVANTAGE (KITS) ×1 IMPLANT
KIT MICROPUNCTURE NIT STIFF (SHEATH) ×1 IMPLANT
KIT PV (KITS) ×3 IMPLANT
SHEATH HIGHFLEX ANSEL 6FRX55 (SHEATH) ×1 IMPLANT
SHEATH PINNACLE 5F 10CM (SHEATH) ×1 IMPLANT
SYR MEDRAD MARK V 150ML (SYRINGE) ×1 IMPLANT
TRANSDUCER W/STOPCOCK (MISCELLANEOUS) ×3 IMPLANT
TRAY PV CATH (CUSTOM PROCEDURE TRAY) ×3 IMPLANT
WIRE BENTSON .035X145CM (WIRE) ×1 IMPLANT
WIRE G V18X300CM (WIRE) ×1 IMPLANT
WIRE ROSEN-J .035X180CM (WIRE) ×1 IMPLANT

## 2020-08-08 NOTE — Progress Notes (Signed)
Report given to Vivian,RN who assumes care at this time

## 2020-08-08 NOTE — Progress Notes (Signed)
Site area: Left groin a 6 french arterial sheath was removed  Site Prior to Removal:  Level 0  Pressure Applied For 20 MINUTES    Bedrest Beginning at 1400p X 4 hours  Manual:   Yes.    Patient Status During Pull:  stable  Post Pull Groin Site:  Level 0  Post Pull Instructions Given:  Yes.    Post Pull Pulses Present:  Yes.    Dressing Applied:  Yes.    Comments:

## 2020-08-08 NOTE — Interval H&P Note (Signed)
History and Physical Interval Note:  08/08/2020 10:19 AM  Keith Espinoza  has presented today for surgery, with the diagnosis of PVD with ulcer.  The various methods of treatment have been discussed with the patient and family. After consideration of risks, benefits and other options for treatment, the patient has consented to  Procedure(s): ABDOMINAL AORTOGRAM W/LOWER EXTREMITY (Bilateral) as a surgical intervention.  The patient's history has been reviewed, patient examined, no change in status, stable for surgery.  I have reviewed the patient's chart and labs.  Questions were answered to the patient's satisfaction.     [name]

## 2020-08-08 NOTE — Op Note (Signed)
PATIENT: Keith Espinoza      MRN: [id number] DOB: 08-Oct-1940    DATE OF PROCEDURE: 08/08/2020  INDICATIONS:    Keith Espinoza is a 79 y.o. male who presented with nonhealing wounds on the right foot and severe tibial artery occlusive disease.  He presents for arteriography and possible intervention  PROCEDURE:    1.  Conscious sedation 2.  Ultrasound-guided access to the left common femoral artery 3.  Aortogram with iliac arteriogram bilateral 4.  Selective catheterization of the right superficial femoral artery (third order catheterization) 5.  Right femoral arteriogram 6.  Angioplasty of right posterior tibial artery with 3 mm x 220 mm balloon  SURGEON: Judeth Cornfield. Scot Dock, MD, FACS  ANESTHESIA: Local with sedation  EBL: Minimal  TECHNIQUE: The patient was brought to the peripheral vascular lab and was sedated. The period of conscious sedation was 97 minutes.  During that time period, I was present face-to-face 100% of the time.  The patient was administered 1 mg of Versed and 50 mcg of fentanyl.  Later in the case he received an additional 1 mg of Versed and 50 mcg of fentanyl. The patient's heart rate, blood pressure, and oxygen saturation were monitored by the nurse continuously during the procedure.  Both groins were prepped and draped in the usual sterile fashion.  Under ultrasound guidance, after the skin was anesthetized, I cannulated the left common femoral artery with a micropuncture needle and a micropuncture sheath was introduced over a wire.  This was exchanged for a 5 [place] sheath over a Bentson wire.  By ultrasound the femoral artery was patent. A real-time image was obtained and sent to the server.  The pigtail catheter was positioned at the L1 vertebral body and flush aortogram obtained.  The catheter was in position above the aortic bifurcation and an oblique iliac projection was obtained.  Next I exchanged the pigtail catheter for a crossover catheter which was  positioned into the right common iliac artery and advanced a wire into the superficial femoral artery.  I then advanced the catheter over the wire and exchanged for a Rosen wire.  A 6 French Ansell sheath was placed over the bifurcation into the superficial femoral artery.  Right lower extremity arteriography was obtained.  This demonstrated mild to moderate superficial femoral artery occlusive disease.  His popliteal artery was patent.  His anterior tibial, posterior tibial, and peroneal arteries were occluded.  There was some short segments of the posterior tibial artery the visualized so I elected to try to get through this and address this with angioplasty.  I was able to get a V 18 wire using a quick cross catheter for support to get through the occluded posterior tibial artery down to the ankle.  I then ballooned with a 3 mm x 220 mm balloon from the ankle up to the popliteal artery.  Balloon was inflated to 6 atm for 1 minute.  Completion film showed some residual disease distally.  We gave nitroglycerin to the sheath and then also remove the wire distally and completion film showed patent runoff onto the foot.  FINDINGS:   1.  Single renal arteries bilaterally with no significant renal artery stenosis identified. 2.  No significant aortoiliac occlusive disease 3.  On the right side the common femoral, deep femoral, and superficial femoral arteries are patent.  There is moderate disease throughout the superficial femoral artery but no critical stenosis.  The popliteal artery is patent.  The patient has a normal  popliteal pulse.  He had severe tibial occlusive disease as described above with all 3 tibials occluded.  I was able to open the right posterior tibial artery down to the ankle with runoff onto the foot.  TASC Classification  Largest Sheath Size: 6 [place]  Target vessel: Posterior tibial artery  % Stenosis: Pre 100%. Post 0%.  Lesion length: 35 cm   Calcification: yes  Most  impactful devices used (Up to 3): 52m by 220 mm balloon.  Outflow: Disease present or not distal to the lesion treated and the  Flow in the distal vessel:  yes   CDeitra Mayo MD, FACS [facility] Center[facility]CTATION:   08/08/2020

## 2020-08-08 NOTE — Discharge Instructions (Signed)
  HOLD METFORMIN FOR A FULL 48 HOURS AFTER DISCHARGE.     Femoral Site Care This sheet gives you information about how to care for yourself after your procedure. Your health care provider may also give you more specific instructions. If you have problems or questions, contact your health care provider. What can I expect after the procedure? After the procedure, it is common to have:  Bruising that usually fades within 1-2 weeks.  Tenderness at the site. Follow these instructions at home: Wound care 1. May remove bandage after 24 hours. 2. Do not take baths, swim, or use a hot tub for 5 days. 3. You may shower 24-48 hours after the procedure. ? Gently wash the site with plain soap and water. ? Pat the area dry with a clean towel. ? Do not rub the site. This may cause bleeding. 4. Do not apply powder or lotion to the site. Keep the site clean and dry. 5. Check your femoral site every day for signs of infection. Check for: ? Redness, swelling, or pain. ? Fluid or blood. ? Warmth. ? Pus or a bad smell. Activity 1. For the first 2-3 days after your procedure, or as long as directed: ? Avoid climbing stairs as much as possible. ? Do not squat. 2. Do not lift, push or pull anything that is heavier than 10 lb for 5 days. 3. Rest as directed. ? Avoid sitting for a long time without moving. Get up to take short walks every 1-2 hours. 4. Do not drive for 24 hours. General instructions  Take over-the-counter and prescription medicines only as told by your health care provider.  Keep all follow-up visits as told by your health care provider. This is important.  DRINK PLENTY OF FLUIDS FOR THE NEXT 2-3 DAYS. Contact a health care provider if you have:  A fever or chills.  You have redness, swelling, or pain around your insertion site. Get help right away if:  The catheter insertion area swells very fast.  You pass out.  You suddenly start to sweat or your skin gets  clammy.  The catheter insertion area is bleeding, and the bleeding does not stop when you hold steady pressure on the area.  The area near or just beyond the catheter insertion site becomes pale, cool, tingly, or numb. These symptoms may represent a serious problem that is an emergency. Do not wait to see if the symptoms will go away. Get medical help right away. Call your local emergency services (911 in the [place] Do not drive yourself to the hospital. Summary  After the procedure, it is common to have bruising that usually fades within 1-2 weeks.  Check your femoral site every day for signs of infection.  Do not lift, push or pull anything that is heavier than 10 lb for 5 days.  This information is not intended to replace advice given to you by your health care provider. Make sure you discuss any questions you have with your health care provider. Document Revised: 10/03/2017 Document Reviewed: 10/03/2017 Elsevier Patient Education  2020 [employer].

## 2020-08-11 ENCOUNTER — Encounter (HOSPITAL_COMMUNITY): Payer: Self-pay | Admitting: Vascular Surgery

## 2020-08-12 DIAGNOSIS — R202 Paresthesia of skin: Secondary | ICD-10-CM | POA: Diagnosis not present

## 2020-08-12 DIAGNOSIS — G894 Chronic pain syndrome: Secondary | ICD-10-CM | POA: Diagnosis not present

## 2020-08-13 DIAGNOSIS — E785 Hyperlipidemia, unspecified: Secondary | ICD-10-CM | POA: Diagnosis not present

## 2020-08-13 DIAGNOSIS — I25118 Atherosclerotic heart disease of native coronary artery with other forms of angina pectoris: Secondary | ICD-10-CM | POA: Diagnosis not present

## 2020-08-13 DIAGNOSIS — N400 Enlarged prostate without lower urinary tract symptoms: Secondary | ICD-10-CM | POA: Diagnosis not present

## 2020-08-13 DIAGNOSIS — I10 Essential (primary) hypertension: Secondary | ICD-10-CM | POA: Diagnosis not present

## 2020-08-13 DIAGNOSIS — E1342 Other specified diabetes mellitus with diabetic polyneuropathy: Secondary | ICD-10-CM | POA: Diagnosis not present

## 2020-08-13 DIAGNOSIS — E1165 Type 2 diabetes mellitus with hyperglycemia: Secondary | ICD-10-CM | POA: Diagnosis not present

## 2020-08-13 DIAGNOSIS — E1140 Type 2 diabetes mellitus with diabetic neuropathy, unspecified: Secondary | ICD-10-CM | POA: Diagnosis not present

## 2020-08-13 DIAGNOSIS — K219 Gastro-esophageal reflux disease without esophagitis: Secondary | ICD-10-CM | POA: Diagnosis not present

## 2020-08-13 DIAGNOSIS — E7849 Other hyperlipidemia: Secondary | ICD-10-CM | POA: Diagnosis not present

## 2020-08-14 ENCOUNTER — Telehealth: Payer: Self-pay

## 2020-08-14 ENCOUNTER — Ambulatory Visit (HOSPITAL_COMMUNITY)
Admission: RE | Admit: 2020-08-14 | Discharge: 2020-08-14 | Disposition: A | Discharge status: Home / Self Care | Payer: Medicare Other | Source: Ambulatory Visit | Attending: Vascular Surgery | Admitting: Vascular Surgery

## 2020-08-14 ENCOUNTER — Other Ambulatory Visit: Payer: Self-pay

## 2020-08-14 ENCOUNTER — Ambulatory Visit (INDEPENDENT_AMBULATORY_CARE_PROVIDER_SITE_OTHER)
Admission: RE | Admit: 2020-08-14 | Discharge: 2020-08-14 | Disposition: A | Discharge status: Home / Self Care | Payer: Medicare Other | Source: Ambulatory Visit | Attending: Internal Medicine | Admitting: Internal Medicine

## 2020-08-14 ENCOUNTER — Ambulatory Visit (INDEPENDENT_AMBULATORY_CARE_PROVIDER_SITE_OTHER): Payer: Medicare Other | Admitting: Physician Assistant

## 2020-08-14 VITALS — BP 115/86 | HR 76 | Temp 97.2°F | Resp 20 | Ht 70.0 in | Wt 165.6 lb

## 2020-08-14 DIAGNOSIS — L97909 Non-pressure chronic ulcer of unspecified part of unspecified lower leg with unspecified severity: Secondary | ICD-10-CM

## 2020-08-14 DIAGNOSIS — I70299 Other atherosclerosis of native arteries of extremities, unspecified extremity: Secondary | ICD-10-CM

## 2020-08-14 DIAGNOSIS — M79671 Pain in right foot: Secondary | ICD-10-CM

## 2020-08-14 MED ORDER — CEPHALEXIN 500 MG PO CAPS: 500.0000 mg | ORAL_CAPSULE | Freq: Three times a day (TID) | ORAL | 0 refills | Status: AC

## 2020-08-14 NOTE — Telephone Encounter (Signed)
Call c/o redness, swelling and severe pain in the R leg s/p aortogram. "I would cut off my leg with a chainsaw" Extremity is still warm and movable. Put patient on for ABI's, duplex and provider visit today.

## 2020-08-14 NOTE — Progress Notes (Signed)
Postoperative Visit (Angio)   History of Present Illness   Keith Espinoza is a 79 y.o. male who presents with increased right foot pain and erythema status post angiogram with balloon angioplasty of posterior tibial artery by Dr. Scot Dock on 08/08/2020.  Angiogram was performed due to nonhealing wounds of right foot.  Patient was found to have patent common femoral, SFA, popliteal, and all 3 tibial vessels were occluded.  After the procedure he had inline flow to the foot via posterior tibial artery.  He states since the procedure his foot has turned red, become swollen, and he has an increase in pain.  Pain is worse with elevating his leg above his heart and pain is alleviated when putting his foot on the ground with pressure.  He continues to take his aspirin and Plavix daily.  He is allergic to statins.  He denies any fevers, chills, nausea/vomiting.   Current Outpatient Medications  Medication Sig Dispense Refill  . allopurinol (ZYLOPRIM) 300 MG tablet Take 300 mg by mouth daily.     Marland Kitchen amLODipine (NORVASC) 5 MG tablet Take 5 mg by mouth daily.    Marland Kitchen aspirin EC 81 MG tablet Take 81 mg by mouth daily.    . Cholecalciferol (VITAMIN D3) 50 MCG (2000 UT) TABS Take 2,000 Units by mouth daily.     . clopidogrel (PLAVIX) 75 MG tablet TAKE ONE TABLET BY MOUTH ONE TIME DAILY (Patient taking differently: Take 75 mg by mouth daily. ) 30 tablet 5  . empagliflozin (JARDIANCE) 25 MG TABS tablet Take 25 mg by mouth daily.    . fluorouracil (EFUDEX) 5 % cream Apply topically in the morning and at bedtime. (Patient taking differently: Apply 1 application topically 2 (two) times daily as needed (skin irritation.). ) 40 g 1  . gabapentin (NEURONTIN) 300 MG capsule Take 600 mg by mouth 2 (two) times daily.   3  . hydrochlorothiazide (HYDRODIURIL) 12.5 MG tablet Take 12.5 mg by mouth daily.    . insulin detemir (LEVEMIR) 100 UNIT/ML injection Inject 12-20 Units into the skin at bedtime.     . isosorbide  mononitrate (IMDUR) 30 MG 24 hr tablet TAKE 1 TABLET BY MOUTH EVERY DAY (Patient taking differently: Take 30 mg by mouth daily. ) 90 tablet 2  . KLOR-CON M20 20 MEQ tablet Take 20 mEq by mouth daily.   3  . losartan (COZAAR) 50 MG tablet Take 50 mg by mouth daily.  2  . metFORMIN (GLUCOPHAGE) 1000 MG tablet Take 1 tablet (1,000 mg total) by mouth 2 (two) times daily.    . nitroGLYCERIN (NITROSTAT) 0.4 MG SL tablet Place 0.4 mg under the tongue every 5 (five) minutes x 3 doses as needed for chest pain.     . ONE TOUCH ULTRA TEST test strip CHECK BLOOD SUGAR ONCE DAILY AS DIRECTED  5  . rosuvastatin (CRESTOR) 10 MG tablet Take 1 tablet (10 mg total) by mouth once a week. 15 tablet 3  . terazosin (HYTRIN) 5 MG capsule Take 5 mg by mouth at bedtime.     . cephALEXin (KEFLEX) 500 MG capsule Take 1 capsule (500 mg total) by mouth 3 (three) times daily for 10 days. 30 capsule 0   No current facility-administered medications for this visit.    ROS: 10 system ROS otherwise negative   For VQI Use Only   PRE-ADM LIVING: Home  AMB STATUS: Ambulatory   Physical Examination   Vitals:   08/14/20 1351  BP:  115/86  Pulse: 76  Resp: 20  Temp: (!) 97.2 F (36.2 C)  TempSrc: Temporal  SpO2: 97%  Weight: 165 lb 9.6 oz (75.1 kg)  Height: 5\' 10"  (1.778 m)   Body mass index is 23.76 kg/m.  General Alert, O x 3, WD, NAD  Pulmonary Sym exp, good B air movt  Cardiac RRR, Nl S1, S2,  Vascular Vessel Right Left  Radial Palpable Palpable  PT Signal by Doppler Not evaluated  DP Soft signal by Doppler Not evaluated    Gastrointestinal soft, non-distended, non-tender to palpation,   Musculoskeletal  right foot with multiple ulcerations of toe tips and between fourth and fifth toe no drainage; high intensity erythema all 5 toes 5 toes and into the forefoot  Neurologic  Pain and light touch intact in extremities , Motor exam as listed above   ABI of right lower extremity falsely elevated Arterial  duplex was reviewed and is consistent with recent angiogram; PTA remains patent   Medical Decision Making   Keith Espinoza is a 79 y.o. male who presents s/p angiogram with balloon angioplasty of posterior tibial artery 6 days ago   Arterial duplex performed today is consistent with recent angiogram; posterior tibial artery remains patent with a brisk Doppler signal  Pain, swelling, and redness likely related to hyperemia after revascularization of posterior tibial artery however given his nonhealing wounds and that he is a diabetic we will prescribed 10 days of Keflex  He is scheduled to follow-up with Dr. Scot Dock at the end of the month  We also discussed wound care which will consist of warm soaks with dial soap of his foot at least daily and to floss a 2 x 2 gauze between his third and fourth toe space to keep these areas dry  Call/return office if symptoms worsen or fail to improve  Continue aspirin and Plavix daily   Dagoberto Ligas PA-C [facility] Office: [phone]  Clinic MD: Dr. Scot Dock was involved in the evaluation and management plan of this patient today

## 2020-08-25 ENCOUNTER — Other Ambulatory Visit: Payer: Self-pay | Admitting: *Deleted

## 2020-08-25 DIAGNOSIS — I70299 Other atherosclerosis of native arteries of extremities, unspecified extremity: Secondary | ICD-10-CM

## 2020-08-25 DIAGNOSIS — L97909 Non-pressure chronic ulcer of unspecified part of unspecified lower leg with unspecified severity: Secondary | ICD-10-CM

## 2020-08-25 NOTE — Patient Outreach (Signed)
[name]) Care Management  08/25/2020  Keith Espinoza Apr 07, 1941 [id number]   RN Health Coach attempted follow up outreach call to patient.  Patient was unavailable. HIPPA compliance voicemail message left with return callback number.  Plan: RN will call patient again within 30 days.  [facility] [phone]

## 2020-08-26 ENCOUNTER — Other Ambulatory Visit: Payer: Self-pay | Admitting: *Deleted

## 2020-08-26 ENCOUNTER — Other Ambulatory Visit: Payer: Self-pay

## 2020-08-26 ENCOUNTER — Telehealth: Payer: Self-pay

## 2020-08-26 ENCOUNTER — Ambulatory Visit (INDEPENDENT_AMBULATORY_CARE_PROVIDER_SITE_OTHER): Payer: Medicare Other | Admitting: Physician Assistant

## 2020-08-26 VITALS — BP 117/62 | HR 69 | Temp 98.2°F | Wt 166.3 lb

## 2020-08-26 DIAGNOSIS — L97909 Non-pressure chronic ulcer of unspecified part of unspecified lower leg with unspecified severity: Secondary | ICD-10-CM | POA: Diagnosis not present

## 2020-08-26 DIAGNOSIS — I70299 Other atherosclerosis of native arteries of extremities, unspecified extremity: Secondary | ICD-10-CM | POA: Diagnosis not present

## 2020-08-26 MED ORDER — CEPHALEXIN 500 MG PO CAPS: 500.0000 mg | ORAL_CAPSULE | Freq: Two times a day (BID) | ORAL | 0 refills | Status: DC

## 2020-08-26 NOTE — Patient Outreach (Signed)
[facility]) Care Management  [place]  08/26/2020   Keith Espinoza [birth year] [id number]  RN Health Coach telephone call to patient.  Hipaa compliance verified. Patient has not checked his blood sugar today. Per patient he has a sore on his foot that has not healed. Per patient he is having some foot pain. Patient is under stress with wife needing a new graft and on dialysis. Patient has agreed to further outreach calls.   Encounter Medications:  Outpatient Encounter Medications as of 08/26/2020  Medication Sig  . allopurinol (ZYLOPRIM) 300 MG tablet Take 300 mg by mouth daily.   Marland Kitchen amLODipine (NORVASC) 5 MG tablet Take 5 mg by mouth daily.  Marland Kitchen aspirin EC 81 MG tablet Take 81 mg by mouth daily.  . cephALEXin (KEFLEX) 500 MG capsule Take 1 capsule (500 mg total) by mouth 2 (two) times daily.  . Cholecalciferol (VITAMIN D3) 50 MCG (2000 UT) TABS Take 2,000 Units by mouth daily.   . clopidogrel (PLAVIX) 75 MG tablet TAKE ONE TABLET BY MOUTH ONE TIME DAILY (Patient taking differently: Take 75 mg by mouth daily. )  . empagliflozin (JARDIANCE) 25 MG TABS tablet Take 25 mg by mouth daily.  . fluorouracil (EFUDEX) 5 % cream Apply topically in the morning and at bedtime. (Patient taking differently: Apply 1 application topically 2 (two) times daily as needed (skin irritation.). )  . gabapentin (NEURONTIN) 300 MG capsule Take 600 mg by mouth 2 (two) times daily.   . hydrochlorothiazide (HYDRODIURIL) 12.5 MG tablet Take 12.5 mg by mouth daily.  . insulin detemir (LEVEMIR) 100 UNIT/ML injection Inject 12-20 Units into the skin at bedtime.   . isosorbide mononitrate (IMDUR) 30 MG 24 hr tablet TAKE 1 TABLET BY MOUTH EVERY DAY (Patient taking differently: Take 30 mg by mouth daily. )  . KLOR-CON M20 20 MEQ tablet Take 20 mEq by mouth daily.   Marland Kitchen losartan (COZAAR) 50 MG tablet Take 50 mg by mouth daily.  . metFORMIN (GLUCOPHAGE) 1000 MG tablet Take 1 tablet (1,000 mg total) by  mouth 2 (two) times daily.  . nitroGLYCERIN (NITROSTAT) 0.4 MG SL tablet Place 0.4 mg under the tongue every 5 (five) minutes x 3 doses as needed for chest pain.   . ONE TOUCH ULTRA TEST test strip CHECK BLOOD SUGAR ONCE DAILY AS DIRECTED  . rosuvastatin (CRESTOR) 10 MG tablet Take 1 tablet (10 mg total) by mouth once a week.  . terazosin (HYTRIN) 5 MG capsule Take 5 mg by mouth at bedtime.    No facility-administered encounter medications on file as of 08/26/2020.    Functional Status:  In your present state of health, do you have any difficulty performing the following activities: 08/08/2020 10/12/2019  Hearing? N N  Vision? N N  Difficulty concentrating or making decisions? N N  Walking or climbing stairs? Y Y  Comment - Patient is having popping and clicking in hip. Per patient it causes him to hop when he walks  Dressing or bathing? N N  Doing errands, shopping? - Scientist, forensic and eating ? - Y  Comment - Patient stated they eat out mostly he doesn't cook  Using the Toilet? - N  In the past six months, have you accidently leaked urine? - N  Do you have problems with loss of bowel control? - N  Managing your Medications? - N  Managing your Finances? - N  Housekeeping or managing your Housekeeping? - Y  Comment -  Daughter helps  Some recent data might be hidden    Fall/Depression Screening: Fall Risk  08/26/2020 10/12/2019  Falls in the past year? 0 0  Number falls in past yr: 0 0  Injury with Fall? - 0  Risk for fall due to : Impaired balance/gait;Impaired mobility Impaired balance/gait;Impaired mobility  Follow up Falls evaluation completed;Falls prevention discussed Falls evaluation completed;Falls prevention discussed;Education provided   PHQ 2/9 Scores 10/12/2019  PHQ - 2 Score 0    Assessment:  Goals Addressed            This Visit's Progress   . ( THN)Monitor and Manage My Blood Sugar       Follow Up Date 12/01/2020   - check blood sugar at prescribed  times - check blood sugar if I feel it is too high or too low - enter blood sugar readings and medication or insulin into daily log - take the blood sugar log to all doctor visits - take the blood sugar meter to all doctor visits    Why is this important?   Checking your blood sugar at home helps to keep it from getting very high or very low.  Writing the results in a diary or log helps the doctor know how to care for you.  Your blood sugar log should have the time, date and the results.  Also, write down the amount of insulin or other medicine that you take.  Other information, like what you ate, exercise done and how you were feeling, will also be helpful.     Notes:     . [facility]) Make and Keep All Appointments       Follow Up Date [id number]   - call to cancel if needed - keep a calendar with prescription refill dates - keep a calendar with appointment dates    Why is this important?   Part of staying healthy is seeing the doctor for follow-up care.  If you forget your appointments, there are some things you can do to stay on track.    Notes:     . (THN)Perform Foot Care       Follow Up Date [id number]   - check feet daily for cuts, sores or redness - keep feet up while sitting - trim toenails straight across - wash and dry feet carefully every day - wear comfortable, cotton socks - wear comfortable, well-fitting shoes    Why is this important?   Good foot care is very important when you have diabetes.  There are many things you can do to keep your feet healthy and catch a problem early.    Notes:  Patient has ulcers on toes. Great toe has lost its toenail and is now draining    . (THN)THN Set My Target A1C       Follow Up Date [id number]   - set target A1C    Why is this important?   Your target A1C is decided together by you and your doctor.  It is based on several things like your age and other health issues.    Notes:        Plan:  Patient will follow up  with vascular surgery on foot ulcers Patient will monitor blood sugar closely since foot infection Patient will continue wound care  RN will follow up outreach within the month of February   Tracen Mahler [place] [facility] [phone]

## 2020-08-26 NOTE — Progress Notes (Signed)
HISTORY AND PHYSICAL     CC:  follow up. Requesting Provider:  [name], MD  HPI: This is a 79 y.o. male who is here today for follow up for PAD.  He has hx of non healing wounds on the right foot and severe tibial artery occlusive disease and is s/p: 1.  Conscious sedation 2.  Ultrasound-guided access to the left common femoral artery 3.  Aortogram with iliac arteriogram bilateral 4.  Selective catheterization of the right superficial femoral artery (third order catheterization) 5.  Right femoral arteriogram 6.  Angioplasty of right posterior tibial artery with 3 mm x 220 mm balloon On 08/08/2020 by Dr. Scot Dock.  Findings as follows: 1.  Single renal arteries bilaterally with no significant renal artery stenosis identified. 2.  No significant aortoiliac occlusive disease 3.  On the right side the common femoral, deep femoral, and superficial femoral arteries are patent.  There is moderate disease throughout the superficial femoral artery but no critical stenosis.  The popliteal artery is patent.  The patient has a normal popliteal pulse.  He had severe tibial occlusive disease as described above with all 3 tibials occluded.  I was able to open the right posterior tibial artery down to the ankle with runoff onto the foot.  Pt was last seen 08/14/2020 by PA and Dr. Scot Dock and at that time, his arterial duplex revealed a patent right PT artery.  He did have some pain, swelling, and redness likely related to hyperemia after revascularization of posterior tibial artery however given his nonhealing wounds and that he is a diabetic he was prescribed 10 days of Keflex and advised to keep dry gauze between the toes to keep dry and continue his Plavix and asa.    The pt returns today for wound check.  He is here with his daughter.  Pt states he took his last abx pill last evening and was concerned about his great toe.  His daughter states there was some drainage and described it as a clear  liquid yellow.  She states it did not look like pus.  He has not had any fevers or felt bad.  He and his daughter feel that the swelling is better as well as the redness.    The pt is on a statin for cholesterol management.    The pt is on an aspirin.    Other AC:  Plavix The pt is on CCB, ARB for hypertension.  The pt does have diabetes. Tobacco hx:  former    Past Medical History:  Diagnosis Date  . Allergic rhinitis   . Allergic rhinitis   . Arthritis   . Basal cell carcinoma 11/01/2019    bcc left chest treatment [place] cx3 65fu   . Chronic leg pain    right  . Chronic lower back pain   . Coronary artery disease    a. Stenting to RCA 2004; staged DES to LAD and Cx 2004. DES to mRCA 2012. b. DES to mCx, PTCA to dCx 11/2011. c. Lateral wall MI 2013 s/p PTCA to distal Cx & DES to mid OM2 11/2011. d. Low risk nuc 04/2014, EF wnl.  . Diabetes mellitus    Insulin dependent  . Diabetic neuropathy (HCC)    MILD  . Diverticulosis   . Dysrhythmia   . Gilbert syndrome   . Gout    right wrist; right foot; right elbow; have had it since 1970's  . H/O hiatal hernia   . Heart murmur   .  History of echocardiogram    aortic sclerosis per echo 12/09 EF 65%, otherwise normal  . History of hemorrhoids    BLEEDING  . History of kidney stones    h/o  . Hypertension    Diagnosed 1995   . Pancreatic pseudocyst    a. s/p remote drainage 2006.  Marland Kitchen Thrombocytopenia ([place])    Seen on oldest labs in system from 2004  . Vitamin B 12 deficiency    orally replaced    Past Surgical History:  Procedure Laterality Date  . ABDOMINAL AORTOGRAM W/LOWER EXTREMITY Bilateral 08/08/2020   Procedure: ABDOMINAL AORTOGRAM W/LOWER EXTREMITY;  Surgeon: Angelia Mould, MD;  Location: [facility];  Service: Cardiovascular;  Laterality: Bilateral;  . BACK SURGERY     "total of 3 times" S/P fall   . CARPAL TUNNEL RELEASE Bilateral   . CHOLECYSTECTOMY  1990's  . COLONOSCOPY    . CORONARY ANGIOPLASTY   11/11/11  . CORONARY ANGIOPLASTY WITH STENT PLACEMENT  09/30/2011   "1 then; makes a total of 4"  . CORONARY ANGIOPLASTY WITH STENT PLACEMENT  11/11/11   "1; makes a total of 5"  . INGUINAL HERNIA REPAIR  2003   right  . JOINT REPLACEMENT Right 04/03/2002   hip replacment  . KNEE ARTHROSCOPY  1990's   left  . LEFT HEART CATHETERIZATION WITH CORONARY ANGIOGRAM N/A 09/30/2011   Procedure: LEFT HEART CATHETERIZATION WITH CORONARY ANGIOGRAM;  Surgeon: Jettie Booze, MD;  Location: [facility] CATH LAB;  Service: Cardiovascular;  Laterality: N/A;  possible PCI  . LEFT HEART CATHETERIZATION WITH CORONARY ANGIOGRAM N/A 11/15/2011   Procedure: LEFT HEART CATHETERIZATION WITH CORONARY ANGIOGRAM;  Surgeon: Jettie Booze, MD;  Location: [facility] CATH LAB;  Service: Cardiovascular;  Laterality: N/A;  . PERCUTANEOUS CORONARY STENT INTERVENTION (PCI-S)  09/30/2011   Procedure: PERCUTANEOUS CORONARY STENT INTERVENTION (PCI-S);  Surgeon: Jettie Booze, MD;  Location: [facility] CATH LAB;  Service: Cardiovascular;;  . PERCUTANEOUS CORONARY STENT INTERVENTION (PCI-S) N/A 11/11/2011   Procedure: PERCUTANEOUS CORONARY STENT INTERVENTION (PCI-S);  Surgeon: Jettie Booze, MD;  Location: [facility] CATH LAB;  Service: Cardiovascular;  Laterality: N/A;  . PERIPHERAL VASCULAR BALLOON ANGIOPLASTY Right 08/08/2020   Procedure: PERIPHERAL VASCULAR BALLOON ANGIOPLASTY;  Surgeon: Angelia Mould, MD;  Location: [facility];  Service: Cardiovascular;  Laterality: Right;  Posterior tibial   . SHOULDER SURGERY Right    X 2  . TONSILLECTOMY  ~ 1948  . TOTAL HIP REVISION Right 04/13/2019   Procedure: RIGHT TOTAL HIP REVISION-POSTERIOR  APPROACH LATERAL;  Surgeon: Marybelle Killings, MD;  Location: [place];  Service: Orthopedics;  Laterality: Right;  . TOTAL KNEE ARTHROPLASTY Left 07/23/2016   Procedure: LEFT TOTAL KNEE ARTHROPLASTY;  Surgeon: Marybelle Killings, MD;  Location: [place];  Service: Orthopedics;  Laterality: Left;     Allergies  Allergen Reactions  . Simvastatin Other (See Comments)    SEVERE MYALGIAS   . Zetia [Ezetimibe] Other (See Comments)    MYALGIAS  . Dilaudid [Hydromorphone Hcl] Other (See Comments)    hallucination  . Fish Oil Nausea Only    Current Outpatient Medications  Medication Sig Dispense Refill  . allopurinol (ZYLOPRIM) 300 MG tablet Take 300 mg by mouth daily.     Marland Kitchen amLODipine (NORVASC) 5 MG tablet Take 5 mg by mouth daily.    Marland Kitchen aspirin EC 81 MG tablet Take 81 mg by mouth daily.    . Cholecalciferol (VITAMIN D3) 50 MCG (2000 UT) TABS Take  2,000 Units by mouth daily.     . clopidogrel (PLAVIX) 75 MG tablet TAKE ONE TABLET BY MOUTH ONE TIME DAILY (Patient taking differently: Take 75 mg by mouth daily. ) 30 tablet 5  . empagliflozin (JARDIANCE) 25 MG TABS tablet Take 25 mg by mouth daily.    . fluorouracil (EFUDEX) 5 % cream Apply topically in the morning and at bedtime. (Patient taking differently: Apply 1 application topically 2 (two) times daily as needed (skin irritation.). ) 40 g 1  . gabapentin (NEURONTIN) 300 MG capsule Take 600 mg by mouth 2 (two) times daily.   3  . hydrochlorothiazide (HYDRODIURIL) 12.5 MG tablet Take 12.5 mg by mouth daily.    . insulin detemir (LEVEMIR) 100 UNIT/ML injection Inject 12-20 Units into the skin at bedtime.     . isosorbide mononitrate (IMDUR) 30 MG 24 hr tablet TAKE 1 TABLET BY MOUTH EVERY DAY (Patient taking differently: Take 30 mg by mouth daily. ) 90 tablet 2  . KLOR-CON M20 20 MEQ tablet Take 20 mEq by mouth daily.   3  . losartan (COZAAR) 50 MG tablet Take 50 mg by mouth daily.  2  . metFORMIN (GLUCOPHAGE) 1000 MG tablet Take 1 tablet (1,000 mg total) by mouth 2 (two) times daily.    . nitroGLYCERIN (NITROSTAT) 0.4 MG SL tablet Place 0.4 mg under the tongue every 5 (five) minutes x 3 doses as needed for chest pain.     . ONE TOUCH ULTRA TEST test strip CHECK BLOOD SUGAR ONCE DAILY AS DIRECTED  5  . rosuvastatin (CRESTOR) 10 MG  tablet Take 1 tablet (10 mg total) by mouth once a week. 15 tablet 3  . terazosin (HYTRIN) 5 MG capsule Take 5 mg by mouth at bedtime.      No current facility-administered medications for this visit.    Family History  Problem Relation Age of Onset  . Diabetes Mother   . Hyperlipidemia Mother   . Hypertension Mother   . Cancer Father   . Hypertension Father   . Diabetes Sister   . Hypertension Sister   . Cancer Brother   . Heart attack Neg Hx     Social History   Socioeconomic History  . Marital status: Married    Spouse name: Not on file  . Number of children: Not on file  . Years of education: Not on file  . Highest education level: Not on file  Occupational History  . Occupation: Retired  Tobacco Use  . Smoking status: Former Research scientist (life sciences)  . Smokeless tobacco: Current User    Types: Chew  . Tobacco comment: quit 60 years ago  Vaping Use  . Vaping Use: Never used  Substance and Sexual Activity  . Alcohol use: No  . Drug use: No  . Sexual activity: Not Currently  Other Topics Concern  . Not on file  Social History Narrative  . Not on file   Social Determinants of Health   Financial Resource Strain:   . Difficulty of Paying Living Expenses: Not on file  Food Insecurity: No Food Insecurity  . Worried About Charity fundraiser in the Last Year: Never true  . Ran Out of Food in the Last Year: Never true  Transportation Needs: No Transportation Needs  . Lack of Transportation (Medical): No  . Lack of Transportation (Non-Medical): No  Physical Activity:   . Days of Exercise per Week: Not on file  . Minutes of Exercise per Session: Not on file  Stress:   . Feeling of Stress : Not on file  Social Connections:   . Frequency of Communication with Friends and Family: Not on file  . Frequency of Social Gatherings with Friends and Family: Not on file  . Attends Religious Services: Not on file  . Active Member of Clubs or Organizations: Not on file  . Attends Theatre manager Meetings: Not on file  . Marital Status: Not on file  Intimate Partner Violence:   . Fear of Current or Ex-Partner: Not on file  . Emotionally Abused: Not on file  . Physically Abused: Not on file  . Sexually Abused: Not on file     REVIEW OF SYSTEMS:  See HPI  PHYSICAL EXAMINATION:  Today's Vitals   08/26/20 1149  BP: 117/62  Pulse: 69  Temp: 98.2 F (36.8 C)  TempSrc: Skin  SpO2: 99%  Weight: 166 lb 4.8 oz (75.4 kg)   Body mass index is 23.86 kg/m.   General:  WDWN in NAD; vital signs documented above Gait: Not observed HENT: WNL, normocephalic Pulmonary: normal non-labored breathing , without wheezing Cardiac: regular HR Skin: without rashes Vascular Exam/Pulses: Brisk doppler signal right PT Extremities:          Musculoskeletal: no muscle wasting or atrophy  Neurologic: A&O X 3;  No focal weakness or paresthesias are detected Psychiatric:  The pt has Normal affect.   Non-Invasive Vascular Imaging:   ABI's/TBI's on 08/14/2020: Right:  1.02/0.36 - Great toe pressure: 48 Left:  1.03/0.59 - Great toe pressure: 79 Summary:  Right: The right toe-brachial index is abnormal. ABIs are unreliable,  falsely elevated due to calcified arteries   Left: The left toe-brachial index is abnormal. ABIs are unreliable,  falsely elevated due to calcified arteries..  Arterial duplex on 08/14/2020: +-----------+--------+-----+---------------+----------+-----------------+  RIGHT   PSV cm/sRatioStenosis    Waveform Comments       +-----------+--------+-----+---------------+----------+-----------------+  CFA Prox  65              triphasic            +-----------+--------+-----+---------------+----------+-----------------+  DFA    82              biphasic            +-----------+--------+-----+---------------+----------+-----------------+  SFA Prox  49 / 190   30-49%  stenosisbiphasic homogenous plaque  +-----------+--------+-----+---------------+----------+-----------------+  SFA Mid  234      50-74% stenosis                +-----------+--------+-----+---------------+----------+-----------------+  SFA Distal 61              triphasic            +-----------+--------+-----+---------------+----------+-----------------+  POP Prox  66              biphasic            +-----------+--------+-----+---------------+----------+-----------------+  POP Mid  77              monophasicbrisk        +-----------+--------+-----+---------------+----------+-----------------+  POP Distal 60              monophasicbrisk        +-----------+--------+-----+---------------+----------+-----------------+  ATA Distal 6               monophasicdampened       +-----------+--------+-----+---------------+----------+-----------------+  PTA Distal 43              monophasic           +-----------+--------+-----+---------------+----------+-----------------+  PERO Distal17              monophasicdampened       +-----------+--------+-----+---------------+----------+-----------------+    Summary:  Right: 50-74% stenosis noted in the mid superficial femoral artery .  Probable tibial artery occlusive disease    ASSESSMENT/PLAN:: 79 y.o. male here for follow up after angiogram with balloon angioplasty of posterior tibial artery on 08/08/2020 by Dr. Scot Dock with concerns about his foot wounds.    -pt continues to have brisk doppler signal right PT.  Per pt and daughter, hyperemia and swelling have improved but they were worried about the wounds on his feet.  His last dose of Keflex was last evening.  Given the redness of the great toe, will give 10 day supply of Keflex 500mg  bid.  He  has not had any fevers or felt bad.   -he will continue his luke warm epsom salt and is going to alternate with dial soaks.   -pt will keep his follow up appointment with Dr. Scot Dock next week.  -continue plavix/asa/statin   Leontine Locket, [facility] [phone]  Clinic MD:   Carlis Abbott

## 2020-08-26 NOTE — Telephone Encounter (Signed)
Patient is having increased pain and worsening of ulcer and great toe. He says he noticed it last night - other ulcers are stable. Will bring in today for wound check and leave on schedule for follow up ABIs in a week.

## 2020-08-26 NOTE — Patient Instructions (Signed)
Goals Addressed            This Visit's Progress   . ( THN)Monitor and Manage My Blood Sugar       Follow Up Date 12/01/2020   - check blood sugar at prescribed times - check blood sugar if I feel it is too high or too low - enter blood sugar readings and medication or insulin into daily log - take the blood sugar log to all doctor visits - take the blood sugar meter to all doctor visits    Why is this important?   Checking your blood sugar at home helps to keep it from getting very high or very low.  Writing the results in a diary or log helps the doctor know how to care for you.  Your blood sugar log should have the time, date and the results.  Also, write down the amount of insulin or other medicine that you take.  Other information, like what you ate, exercise done and how you were feeling, will also be helpful.     Notes:     . [facility]) Make and Keep All Appointments       Follow Up Date [id number]   - call to cancel if needed - keep a calendar with prescription refill dates - keep a calendar with appointment dates    Why is this important?   Part of staying healthy is seeing the doctor for follow-up care.  If you forget your appointments, there are some things you can do to stay on track.    Notes:     . (THN)Perform Foot Care       Follow Up Date [id number]   - check feet daily for cuts, sores or redness - keep feet up while sitting - trim toenails straight across - wash and dry feet carefully every day - wear comfortable, cotton socks - wear comfortable, well-fitting shoes    Why is this important?   Good foot care is very important when you have diabetes.  There are many things you can do to keep your feet healthy and catch a problem early.    Notes:  Patient has ulcers on toes. Great toe has lost its toenail and is now draining    . (THN)THN Set My Target A1C       Follow Up Date [id number]   - set target A1C    Why is this important?   Your target A1C is  decided together by you and your doctor.  It is based on several things like your age and other health issues.    Notes:

## 2020-09-03 ENCOUNTER — Other Ambulatory Visit: Payer: Self-pay

## 2020-09-03 ENCOUNTER — Ambulatory Visit (HOSPITAL_COMMUNITY)
Admission: RE | Admit: 2020-09-03 | Discharge: 2020-09-03 | Disposition: A | Discharge status: Home / Self Care | Payer: Medicare Other | Source: Ambulatory Visit | Attending: Vascular Surgery | Admitting: Vascular Surgery

## 2020-09-03 ENCOUNTER — Ambulatory Visit (INDEPENDENT_AMBULATORY_CARE_PROVIDER_SITE_OTHER): Payer: Medicare Other | Admitting: Vascular Surgery

## 2020-09-03 ENCOUNTER — Encounter: Payer: Self-pay | Admitting: Vascular Surgery

## 2020-09-03 VITALS — BP 126/76 | HR 71 | Resp 20 | Ht 70.0 in | Wt 166.0 lb

## 2020-09-03 DIAGNOSIS — I739 Peripheral vascular disease, unspecified: Secondary | ICD-10-CM | POA: Diagnosis not present

## 2020-09-03 DIAGNOSIS — I70299 Other atherosclerosis of native arteries of extremities, unspecified extremity: Secondary | ICD-10-CM

## 2020-09-03 DIAGNOSIS — L97909 Non-pressure chronic ulcer of unspecified part of unspecified lower leg with unspecified severity: Secondary | ICD-10-CM | POA: Insufficient documentation

## 2020-09-03 MED ORDER — CEPHALEXIN 500 MG PO CAPS: 500.0000 mg | ORAL_CAPSULE | Freq: Three times a day (TID) | ORAL | 1 refills | Status: DC

## 2020-09-03 NOTE — Progress Notes (Signed)
REASON FOR VISIT:   Follow-up of peripheral vascular disease  MEDICAL ISSUES:   CRITICAL LIMB ISCHEMIA: This patient has multiple wounds on the right foot with cellulitis of the foot.  He underwent an arteriogram and had severe tibial artery occlusive disease.  He underwent angioplasty of the right posterior tibial artery with a good result and continues to have excellent flow in the posterior tibial artery.  I do not see any further options for revascularization.  Fortunately the wounds and the cellulitis in the right foot have improved.  I do think he needs to continue his Keflex 5 sent a prescription for 2 more weeks of Keflex.  I will plan on seeing him back in 4 to 6 weeks.  He knows to call sooner if he has problems.   HPI:   DAILY CRATE is a pleasant 79 y.o. male who had presented with a nonhealing wound of the right foot and evidence of severe tibial artery occlusive disease.  He underwent an arteriogram on 08/08/2020 and had angioplasty of the right posterior tibial artery.  On the right side he had no significant inflow disease.  The common femoral, deep femoral, and superficial femoral arteries were patent.  There was moderate disease throughout the superficial femoral artery but no critical stenosis.  Popliteal artery was patent.  The patient had a normal popliteal pulse.  Severe tibial artery occlusive disease with all 3 tibials occluded.  I was able to get a wire through the posterior tibial artery and perform balloon angioplasty with a good result.  The patient was seen in the office on 08/26/2020 by the physicians assistant and continued to have brisk Doppler signals in the right posterior tibial artery.  The patient had been having swelling and cellulitis and had been placed on Keflex previously.  He was on Plavix aspirin and a statin.  He was set up for a follow-up visit with me.  He is almost completed his course of Keflex and the cellulitis has improved.  He has no specific  complaints.  His daughter has been doing an excellent job with the dressing changes on the right foot.  He denies any rest pain.  Past Medical History:  Diagnosis Date  . Allergic rhinitis   . Allergic rhinitis   . Arthritis   . Basal cell carcinoma 11/01/2019    bcc left chest treatment [place] cx3 20fu   . Chronic leg pain    right  . Chronic lower back pain   . Coronary artery disease    a. Stenting to RCA 2004; staged DES to LAD and Cx 2004. DES to mRCA 2012. b. DES to mCx, PTCA to dCx 11/2011. c. Lateral wall MI 2013 s/p PTCA to distal Cx & DES to mid OM2 11/2011. d. Low risk nuc 04/2014, EF wnl.  . Diabetes mellitus    Insulin dependent  . Diabetic neuropathy (HCC)    MILD  . Diverticulosis   . Dysrhythmia   . Gilbert syndrome   . Gout    right wrist; right foot; right elbow; have had it since 1970's  . H/O hiatal hernia   . Heart murmur   . History of echocardiogram    aortic sclerosis per echo 12/09 EF 65%, otherwise normal  . History of hemorrhoids    BLEEDING  . History of kidney stones    h/o  . Hypertension    Diagnosed 1995   . Pancreatic pseudocyst    a. s/p remote drainage 2006.  Marland Kitchen  Thrombocytopenia ([facility])    Seen on oldest labs in system from 2004  . Vitamin B 12 deficiency    orally replaced    Family History  Problem Relation Age of Onset  . Diabetes Mother   . Hyperlipidemia Mother   . Hypertension Mother   . Cancer Father   . Hypertension Father   . Diabetes Sister   . Hypertension Sister   . Cancer Brother   . Heart attack Neg Hx     SOCIAL HISTORY: Social History   Tobacco Use  . Smoking status: Former Research scientist (life sciences)  . Smokeless tobacco: Current User    Types: Chew  . Tobacco comment: quit 60 years ago  Substance Use Topics  . Alcohol use: No    Allergies  Allergen Reactions  . Simvastatin Other (See Comments)    SEVERE MYALGIAS   . Zetia [Ezetimibe] Other (See Comments)    MYALGIAS  . Dilaudid [Hydromorphone Hcl] Other (See Comments)     hallucination  . Fish Oil Nausea Only    Current Outpatient Medications  Medication Sig Dispense Refill  . allopurinol (ZYLOPRIM) 300 MG tablet Take 300 mg by mouth daily.     Marland Kitchen amLODipine (NORVASC) 5 MG tablet Take 5 mg by mouth daily.    Marland Kitchen aspirin EC 81 MG tablet Take 81 mg by mouth daily.    . cephALEXin (KEFLEX) 500 MG capsule Take 1 capsule (500 mg total) by mouth 2 (two) times daily. 20 capsule 0  . Cholecalciferol (VITAMIN D3) 50 MCG (2000 UT) TABS Take 2,000 Units by mouth daily.     . clopidogrel (PLAVIX) 75 MG tablet TAKE ONE TABLET BY MOUTH ONE TIME DAILY (Patient taking differently: Take 75 mg by mouth daily. ) 30 tablet 5  . empagliflozin (JARDIANCE) 25 MG TABS tablet Take 25 mg by mouth daily.    . fluorouracil (EFUDEX) 5 % cream Apply topically in the morning and at bedtime. (Patient taking differently: Apply 1 application topically 2 (two) times daily as needed (skin irritation.). ) 40 g 1  . gabapentin (NEURONTIN) 300 MG capsule Take 600 mg by mouth 2 (two) times daily.   3  . hydrochlorothiazide (HYDRODIURIL) 12.5 MG tablet Take 12.5 mg by mouth daily.    . insulin detemir (LEVEMIR) 100 UNIT/ML injection Inject 12-20 Units into the skin at bedtime.     . isosorbide mononitrate (IMDUR) 30 MG 24 hr tablet TAKE 1 TABLET BY MOUTH EVERY DAY (Patient taking differently: Take 30 mg by mouth daily. ) 90 tablet 2  . KLOR-CON M20 20 MEQ tablet Take 20 mEq by mouth daily.   3  . losartan (COZAAR) 50 MG tablet Take 50 mg by mouth daily.  2  . metFORMIN (GLUCOPHAGE) 1000 MG tablet Take 1 tablet (1,000 mg total) by mouth 2 (two) times daily.    . nitroGLYCERIN (NITROSTAT) 0.4 MG SL tablet Place 0.4 mg under the tongue every 5 (five) minutes x 3 doses as needed for chest pain.     . ONE TOUCH ULTRA TEST test strip CHECK BLOOD SUGAR ONCE DAILY AS DIRECTED  5  . rosuvastatin (CRESTOR) 10 MG tablet Take 1 tablet (10 mg total) by mouth once a week. 15 tablet 3  . terazosin (HYTRIN) 5 MG  capsule Take 5 mg by mouth at bedtime.      No current facility-administered medications for this visit.    REVIEW OF SYSTEMS:  [X]  denotes positive finding, [ ]  denotes negative finding Cardiac  Comments:  Chest pain or chest pressure:    Shortness of breath upon exertion:    Short of breath when lying flat:    Irregular heart rhythm:        Vascular    Pain in calf, thigh, or hip brought on by ambulation:    Pain in feet at night that wakes you up from your sleep:     Blood clot in your veins:    Leg swelling:         Pulmonary    Oxygen at home:    Productive cough:     Wheezing:         Neurologic    Sudden weakness in arms or legs:     Sudden numbness in arms or legs:     Sudden onset of difficulty speaking or slurred speech:    Temporary loss of vision in one eye:     Problems with dizziness:         Gastrointestinal    Blood in stool:     Vomited blood:         Genitourinary    Burning when urinating:     Blood in urine:        Psychiatric    Major depression:         Hematologic    Bleeding problems:    Problems with blood clotting too easily:        Skin    Rashes or ulcers: x       Constitutional    Fever or chills:     PHYSICAL EXAM:   Vitals:   09/03/20 1506  BP: 126/76  Pulse: 71  Resp: 20  SpO2: 98%  Weight: 166 lb (75.3 kg)  Height: 5\' 10"  (1.778 m)    GENERAL: The patient is a well-nourished male, in no acute distress. The vital signs are documented above. CARDIAC: There is a regular rate and rhythm.  VASCULAR: He has a brisk posterior tibial signal on the right foot with a Doppler. PULMONARY: There is good air exchange bilaterally without wheezing or rales. MUSCULOSKELETAL: There are no major deformities or cyanosis. NEUROLOGIC: No focal weakness or paresthesias are detected. SKIN: The wounds on the right foot are stable and the cellulitis has improved.      PSYCHIATRIC: The patient has a normal affect.  DATA:     ARTERIAL DOPPLER STUDY: I have independently interpreted his arterial Doppler study today.  On the right side there is a monophasic posterior tibial signal with an ABI of 100%.  There is a monophasic dorsalis pedis signal.  On the left side there is a monophasic dorsalis pedis and posterior tibial signal.  ABIs 100%.  Toe pressure 70 mmHg.  [name] [facility] [phone]

## 2020-09-10 ENCOUNTER — Ambulatory Visit: Payer: Medicare Other | Admitting: *Deleted

## 2020-09-23 ENCOUNTER — Other Ambulatory Visit: Payer: Self-pay | Admitting: *Deleted

## 2020-09-23 NOTE — Patient Outreach (Signed)
[facility]) Care Management  09/23/2020  Keith Espinoza 15-Nov-1940 [id number]   Referral from [name], RN for medication assistance sent to [employer] on 09/23/20.  Ina Homes [facility] Management Assistant [phone]

## 2020-09-24 DIAGNOSIS — K219 Gastro-esophageal reflux disease without esophagitis: Secondary | ICD-10-CM | POA: Diagnosis not present

## 2020-09-24 DIAGNOSIS — E785 Hyperlipidemia, unspecified: Secondary | ICD-10-CM | POA: Diagnosis not present

## 2020-09-24 DIAGNOSIS — N400 Enlarged prostate without lower urinary tract symptoms: Secondary | ICD-10-CM | POA: Diagnosis not present

## 2020-09-24 DIAGNOSIS — E1165 Type 2 diabetes mellitus with hyperglycemia: Secondary | ICD-10-CM | POA: Diagnosis not present

## 2020-09-24 DIAGNOSIS — I25118 Atherosclerotic heart disease of native coronary artery with other forms of angina pectoris: Secondary | ICD-10-CM | POA: Diagnosis not present

## 2020-09-24 DIAGNOSIS — E1140 Type 2 diabetes mellitus with diabetic neuropathy, unspecified: Secondary | ICD-10-CM | POA: Diagnosis not present

## 2020-09-24 DIAGNOSIS — I10 Essential (primary) hypertension: Secondary | ICD-10-CM | POA: Diagnosis not present

## 2020-09-24 DIAGNOSIS — E1342 Other specified diabetes mellitus with diabetic polyneuropathy: Secondary | ICD-10-CM | POA: Diagnosis not present

## 2020-10-15 ENCOUNTER — Encounter: Payer: Self-pay | Admitting: Vascular Surgery

## 2020-10-15 ENCOUNTER — Other Ambulatory Visit: Payer: Self-pay

## 2020-10-15 ENCOUNTER — Ambulatory Visit (INDEPENDENT_AMBULATORY_CARE_PROVIDER_SITE_OTHER): Payer: Medicare Other | Admitting: Vascular Surgery

## 2020-10-15 ENCOUNTER — Telehealth: Payer: Self-pay

## 2020-10-15 VITALS — BP 165/68 | HR 81 | Temp 97.6°F | Resp 20 | Ht 70.0 in | Wt 165.0 lb

## 2020-10-15 DIAGNOSIS — I70299 Other atherosclerosis of native arteries of extremities, unspecified extremity: Secondary | ICD-10-CM | POA: Diagnosis not present

## 2020-10-15 DIAGNOSIS — L97909 Non-pressure chronic ulcer of unspecified part of unspecified lower leg with unspecified severity: Secondary | ICD-10-CM | POA: Diagnosis not present

## 2020-10-15 NOTE — Progress Notes (Signed)
Patient name: Keith Espinoza MRN: [id number] DOB: [birth year] Sex: male  REASON FOR VISIT:   Follow-up after tibial angioplasty  HPI:   Keith Espinoza is a pleasant 80 y.o. male who had presented with a nonhealing wound on the right foot with severe tibial artery occlusive disease.  He underwent an arteriogram on 08/08/2020.  He underwent angioplasty of the right posterior tibial artery with a 3 mm balloon.  He comes in for 6-week follow-up visit.  He has no specific complaints. The wounds he thinks have improved significantly. He denies any rest pain.  Current Outpatient Medications  Medication Sig Dispense Refill  . allopurinol (ZYLOPRIM) 300 MG tablet Take 300 mg by mouth daily.    Marland Kitchen amLODipine (NORVASC) 5 MG tablet Take 5 mg by mouth daily.    Marland Kitchen aspirin EC 81 MG tablet Take 81 mg by mouth daily.    . cephALEXin (KEFLEX) 500 MG capsule Take 1 capsule (500 mg total) by mouth 3 (three) times daily. 42 capsule 1  . Cholecalciferol (VITAMIN D3) 50 MCG (2000 UT) TABS Take 2,000 Units by mouth daily.     . clopidogrel (PLAVIX) 75 MG tablet TAKE ONE TABLET BY MOUTH ONE TIME DAILY (Patient taking differently: Take 75 mg by mouth daily.) 30 tablet 5  . empagliflozin (JARDIANCE) 25 MG TABS tablet Take 25 mg by mouth daily.    . fluorouracil (EFUDEX) 5 % cream Apply topically in the morning and at bedtime. (Patient taking differently: Apply 1 application topically 2 (two) times daily as needed (skin irritation.).) 40 g 1  . gabapentin (NEURONTIN) 300 MG capsule Take 600 mg by mouth 2 (two) times daily.   3  . hydrochlorothiazide (HYDRODIURIL) 12.5 MG tablet Take 12.5 mg by mouth daily.    . insulin detemir (LEVEMIR) 100 UNIT/ML injection Inject 12-20 Units into the skin at bedtime.    . isosorbide mononitrate (IMDUR) 30 MG 24 hr tablet TAKE 1 TABLET BY MOUTH EVERY DAY (Patient taking differently: Take 30 mg by mouth daily.) 90 tablet 2  . KLOR-CON M20 20 MEQ tablet Take 20 mEq by mouth daily.   3   . losartan (COZAAR) 50 MG tablet Take 50 mg by mouth daily.  2  . metFORMIN (GLUCOPHAGE) 1000 MG tablet Take 1 tablet (1,000 mg total) by mouth 2 (two) times daily.    . nitroGLYCERIN (NITROSTAT) 0.4 MG SL tablet Place 0.4 mg under the tongue every 5 (five) minutes x 3 doses as needed for chest pain.     . ONE TOUCH ULTRA TEST test strip CHECK BLOOD SUGAR ONCE DAILY AS DIRECTED  5  . rosuvastatin (CRESTOR) 10 MG tablet Take 1 tablet (10 mg total) by mouth once a week. 15 tablet 3  . terazosin (HYTRIN) 5 MG capsule Take 5 mg by mouth at bedtime.     No current facility-administered medications for this visit.    REVIEW OF SYSTEMS:  [X]  denotes positive finding, [ ]  denotes negative finding Vascular    Leg swelling    Cardiac    Chest pain or chest pressure:    Shortness of breath upon exertion:    Short of breath when lying flat:    Irregular heart rhythm:    Constitutional    Fever or chills:     PHYSICAL EXAM:   Vitals:   10/15/20 1119  BP: (!) 165/68  Pulse: 81  Resp: 20  Temp: 97.6 F (36.4 C)  SpO2: 97%  Weight: 165 lb (  74.8 kg)  Height: 5\' 10"  (1.778 m)    GENERAL: The patient is a well-nourished male, in no acute distress. The vital signs are documented above. CARDIOVASCULAR: There is a regular rate and rhythm. PULMONARY: There is good air exchange bilaterally without wheezing or rales. VASCULAR: He has a brisk posterior tibial signal with a Doppler and a monophasic anterior tibial signal. His wounds are improving as documented in the photographs below.         DATA:   No new data  MEDICAL ISSUES:   PERIPHERAL VASCULAR DISEASE: This patient is undergone successful tibial angioplasty of the posterior tibial artery with a good result. He is got brisk Doppler signals in the posterior tibial artery distally. The wounds on his right foot are improving. I have ordered follow-up ABIs and a duplex in 3 months and I will see him back at that time. We have also  discussed the importance of leg elevation. He knows to call sooner if he has problems.  [name] [facility] [place] [phone]

## 2020-10-15 NOTE — Telephone Encounter (Signed)
Confirmed with Dr. Scot Dock, patient does not need antibiotics. Patient aware.

## 2020-10-29 ENCOUNTER — Ambulatory Visit: Payer: Medicare Other | Admitting: Physician Assistant

## 2020-10-30 ENCOUNTER — Ambulatory Visit: Payer: Medicare Other | Admitting: Physician Assistant

## 2020-11-12 ENCOUNTER — Other Ambulatory Visit: Payer: Self-pay | Admitting: Interventional Cardiology

## 2020-11-17 ENCOUNTER — Other Ambulatory Visit: Payer: Self-pay

## 2020-11-17 ENCOUNTER — Ambulatory Visit (INDEPENDENT_AMBULATORY_CARE_PROVIDER_SITE_OTHER)
Admission: RE | Admit: 2020-11-17 | Discharge: 2020-11-17 | Disposition: A | Discharge status: Home / Self Care | Payer: Medicare Other | Source: Ambulatory Visit | Attending: Vascular Surgery | Admitting: Vascular Surgery

## 2020-11-17 ENCOUNTER — Ambulatory Visit (HOSPITAL_COMMUNITY)
Admission: RE | Admit: 2020-11-17 | Discharge: 2020-11-17 | Disposition: A | Discharge status: Home / Self Care | Payer: Medicare Other | Source: Ambulatory Visit | Attending: Vascular Surgery | Admitting: Vascular Surgery

## 2020-11-17 DIAGNOSIS — E1165 Type 2 diabetes mellitus with hyperglycemia: Secondary | ICD-10-CM | POA: Insufficient documentation

## 2020-11-17 DIAGNOSIS — L97909 Non-pressure chronic ulcer of unspecified part of unspecified lower leg with unspecified severity: Secondary | ICD-10-CM | POA: Insufficient documentation

## 2020-11-17 DIAGNOSIS — E1140 Type 2 diabetes mellitus with diabetic neuropathy, unspecified: Secondary | ICD-10-CM | POA: Insufficient documentation

## 2020-11-17 DIAGNOSIS — E669 Obesity, unspecified: Secondary | ICD-10-CM | POA: Insufficient documentation

## 2020-11-17 DIAGNOSIS — Z794 Long term (current) use of insulin: Secondary | ICD-10-CM | POA: Insufficient documentation

## 2020-11-17 DIAGNOSIS — I70299 Other atherosclerosis of native arteries of extremities, unspecified extremity: Secondary | ICD-10-CM

## 2020-11-19 ENCOUNTER — Ambulatory Visit (INDEPENDENT_AMBULATORY_CARE_PROVIDER_SITE_OTHER): Payer: Medicare Other | Admitting: Vascular Surgery

## 2020-11-19 ENCOUNTER — Other Ambulatory Visit: Payer: Self-pay

## 2020-11-19 ENCOUNTER — Encounter: Payer: Self-pay | Admitting: Vascular Surgery

## 2020-11-19 VITALS — BP 124/67 | HR 72 | Temp 97.9°F | Resp 20 | Ht 70.0 in | Wt 162.0 lb

## 2020-11-19 DIAGNOSIS — I70299 Other atherosclerosis of native arteries of extremities, unspecified extremity: Secondary | ICD-10-CM

## 2020-11-19 DIAGNOSIS — L97909 Non-pressure chronic ulcer of unspecified part of unspecified lower leg with unspecified severity: Secondary | ICD-10-CM

## 2020-11-19 NOTE — Progress Notes (Signed)
Patient name: Keith Espinoza MRN: [id number] DOB: December 31, 1940 Sex: male  REASON FOR VISIT:   Follow-up of peripheral vascular disease  HPI:   Keith Espinoza is a pleasant 80 y.o. male who presented with a nonhealing wound on the right foot with severe tibial artery occlusive disease.  He underwent an arteriogram in November 2021 with angioplasty of the right posterior tibial artery with a 3 mm balloon.  When I saw him on 10/15/2020, he had multiple wounds on the right foot as documented in the photographs from that visit.  He had a brisk posterior tibial signal with the Doppler.  He was to come back in 3 months for a duplex and ABIs.  He came in sooner because he was concerned about a wound on the right leg.  He was concerned about an area in the pretibial area that might be infected.  However he does not remember any specific injury here.  The wounds on his foot he thinks are about the same.  He denies fever or chills.  Current Outpatient Medications  Medication Sig Dispense Refill  . allopurinol (ZYLOPRIM) 300 MG tablet Take 300 mg by mouth daily.    Marland Kitchen amLODipine (NORVASC) 5 MG tablet Take 5 mg by mouth daily.    Marland Kitchen aspirin EC 81 MG tablet Take 81 mg by mouth daily.    . cephALEXin (KEFLEX) 500 MG capsule Take 1 capsule (500 mg total) by mouth 3 (three) times daily. 42 capsule 1  . Cholecalciferol (VITAMIN D3) 50 MCG (2000 UT) TABS Take 2,000 Units by mouth daily.     . clopidogrel (PLAVIX) 75 MG tablet TAKE ONE TABLET BY MOUTH ONE TIME DAILY (Patient taking differently: Take 75 mg by mouth daily.) 30 tablet 5  . empagliflozin (JARDIANCE) 25 MG TABS tablet Take 25 mg by mouth daily.    . fluorouracil (EFUDEX) 5 % cream Apply topically in the morning and at bedtime. (Patient taking differently: Apply 1 application topically 2 (two) times daily as needed (skin irritation.).) 40 g 1  . gabapentin (NEURONTIN) 300 MG capsule Take 600 mg by mouth 2 (two) times daily.   3  . hydrochlorothiazide  (HYDRODIURIL) 12.5 MG tablet Take 12.5 mg by mouth daily.    . insulin detemir (LEVEMIR) 100 UNIT/ML injection Inject 12-20 Units into the skin at bedtime.    . isosorbide mononitrate (IMDUR) 30 MG 24 hr tablet Take 1 tablet (30 mg total) by mouth daily. Please schedule yearly appointment for future refills. Thank you 90 tablet 0  . KLOR-CON M20 20 MEQ tablet Take 20 mEq by mouth daily.   3  . losartan (COZAAR) 50 MG tablet Take 50 mg by mouth daily.  2  . metFORMIN (GLUCOPHAGE) 1000 MG tablet Take 1 tablet (1,000 mg total) by mouth 2 (two) times daily.    . nitroGLYCERIN (NITROSTAT) 0.4 MG SL tablet Place 0.4 mg under the tongue every 5 (five) minutes x 3 doses as needed for chest pain.     . ONE TOUCH ULTRA TEST test strip CHECK BLOOD SUGAR ONCE DAILY AS DIRECTED  5  . rosuvastatin (CRESTOR) 10 MG tablet Take 1 tablet (10 mg total) by mouth once a week. 15 tablet 3  . terazosin (HYTRIN) 5 MG capsule Take 5 mg by mouth at bedtime.     No current facility-administered medications for this visit.    REVIEW OF SYSTEMS:  [X]  denotes positive finding, [ ]  denotes negative finding Vascular    Leg  swelling    Cardiac    Chest pain or chest pressure:    Shortness of breath upon exertion:    Short of breath when lying flat:    Irregular heart rhythm:    Constitutional    Fever or chills:     PHYSICAL EXAM:   Vitals:   11/19/20 0845  BP: 124/67  Pulse: 72  Resp: 20  Temp: 97.9 F (36.6 C)  SpO2: 96%  Weight: 73.5 kg  Height: 5\' 10"  (1.778 m)    GENERAL: The patient is a well-nourished male, in no acute distress. The vital signs are documented above. CARDIOVASCULAR: There is a regular rate and rhythm. PULMONARY: There is good air exchange bilaterally without wheezing or rales. VASCULAR: He has a palpable right femoral and popliteal pulse.  He has a brisk posterior tibial and peroneal signal with the Doppler. EXTREMITIES: The wounds on the right foot look about the same, possibly a  little better comparing the photographs from 10/15/2020.  I do not see any new wounds on the pretibial area where he is concerned.           DATA:   No new data  MEDICAL ISSUES:   PERIPHERAL VASCULAR DISEASE: The wounds on the right foot are about the same.  The area that he was concerned about on his shin looks fairly unremarkable to me.  I did review his arteriogram again.  He had severe tibial artery occlusive disease but had a fairly nice result from his posterior tibial angioplasty with runoff onto his foot.  This was his only runoff.  And I think there is too much more to do from a vascular standpoint as I have explained to him that I think this is as good as the circulation will get.  He will keep his appointment in approximately 2 months with ABIs and a duplex.  He knows to call sooner if he has problems.  He will continue to soak his foot and keep a clean dressing on his wounds.  [name] [facility] [place] [phone]

## 2020-11-24 ENCOUNTER — Other Ambulatory Visit: Payer: Self-pay | Admitting: *Deleted

## 2020-11-24 NOTE — Patient Instructions (Signed)
Goals Addressed            This Visit's Progress   . ( [facility])Monitor and Manage My Blood Sugar   On track    Timeframe:  Long-Range Goal Priority:  Medium Start Date:  40347425                        Expected End Date:  [id number]                    Follow Up Date [id number]   - check blood sugar at prescribed times - check blood sugar if I feel it is too high or too low - enter blood sugar readings and medication or insulin into daily log - take the blood sugar log to all doctor visits - take the blood sugar meter to all doctor visits    Why is this important?   Checking your blood sugar at home helps to keep it from getting very high or very low.  Writing the results in a diary or log helps the doctor know how to care for you.  Your blood sugar log should have the time, date and the results.  Also, write down the amount of insulin or other medicine that you take.  Other information, like what you ate, exercise done and how you were feeling, will also be helpful.     Notes:     . [facility]) Make and Keep All Appointments   Not on track    Timeframe:  Long-Range Goal Priority:  Medium Start Date:   84166063                          Expected End Date:   01601093                   Follow Up Date [id number]   - call to cancel if needed - keep a calendar with prescription refill dates - keep a calendar with appointment dates    Why is this important?   Part of staying healthy is seeing the doctor for follow-up care.  If you forget your appointments, there are some things you can do to stay on track.    Notes:  Need to schedule exam    . ([facility])Perform Foot Care   On track    Timeframe:  Long-Range Goal Priority:  Medium Start Date:  02542706                           Expected End Date:     23762831                 Follow Up Date [id number]   - check feet daily for cuts, sores or redness - keep feet up while sitting - trim toenails straight across - wash and dry feet carefully every  day - wear comfortable, cotton socks - wear comfortable, well-fitting shoes    Why is this important?   Good foot care is very important when you have diabetes.  There are many things you can do to keep your feet healthy and catch a problem early.    Notes:  Patient has ulcers on toes. Great toe has lost its toenail and is now draining [id number] foot is healing some    . (THN)[facility] Set My Target A1C   On track    Timeframe:  Long-Range Goal Priority:  High Start Date:  61470929                           Expected End Date:    57473403                  Follow Up Date [id number]   - set target A1C    Why is this important?   Your target A1C is decided together by you and your doctor.  It is based on several things like your age and other health issues.    Notes: 7.0

## 2020-11-24 NOTE — Patient Outreach (Signed)
[facility]) Care Management  [place]  11/24/2020   Keith Espinoza 01/05/1941 [id number]  RN Health Coach telephone call to patient.  Hipaa compliance verified. Per patient his blood sugar was 112. Patient stated that he has been going to a vascular surgeon due to the decrease blood flow in foot. Per patient he has some non healing ulcers on foot. He is doing foot soaks and dressing changes with his daughter assistance.  Per patient he does not eat out much. Patient has received all his Covid vaccines. Patient has agreed to follow up outreach calls.     Encounter Medications:  Outpatient Encounter Medications as of 11/24/2020  Medication Sig  . empagliflozin (JARDIANCE) 25 MG TABS tablet Take 25 mg by mouth daily.  Marland Kitchen allopurinol (ZYLOPRIM) 300 MG tablet Take 300 mg by mouth daily.  Marland Kitchen amLODipine (NORVASC) 5 MG tablet Take 5 mg by mouth daily.  Marland Kitchen aspirin EC 81 MG tablet Take 81 mg by mouth daily.  . cephALEXin (KEFLEX) 500 MG capsule Take 1 capsule (500 mg total) by mouth 3 (three) times daily.  . Cholecalciferol (VITAMIN D3) 50 MCG (2000 UT) TABS Take 2,000 Units by mouth daily.   . clopidogrel (PLAVIX) 75 MG tablet TAKE ONE TABLET BY MOUTH ONE TIME DAILY (Patient taking differently: Take 75 mg by mouth daily.)  . fluorouracil (EFUDEX) 5 % cream Apply topically in the morning and at bedtime. (Patient taking differently: Apply 1 application topically 2 (two) times daily as needed (skin irritation.).)  . gabapentin (NEURONTIN) 300 MG capsule Take 600 mg by mouth 2 (two) times daily.   . hydrochlorothiazide (HYDRODIURIL) 12.5 MG tablet Take 12.5 mg by mouth daily.  . insulin detemir (LEVEMIR) 100 UNIT/ML injection Inject 12-20 Units into the skin at bedtime.  . isosorbide mononitrate (IMDUR) 30 MG 24 hr tablet Take 1 tablet (30 mg total) by mouth daily. Please schedule yearly appointment for future refills. Thank you  . KLOR-CON M20 20 MEQ tablet Take 20 mEq by mouth  daily.   Marland Kitchen losartan (COZAAR) 50 MG tablet Take 50 mg by mouth daily.  . metFORMIN (GLUCOPHAGE) 1000 MG tablet Take 1 tablet (1,000 mg total) by mouth 2 (two) times daily.  . nitroGLYCERIN (NITROSTAT) 0.4 MG SL tablet Place 0.4 mg under the tongue every 5 (five) minutes x 3 doses as needed for chest pain.   . ONE TOUCH ULTRA TEST test strip CHECK BLOOD SUGAR ONCE DAILY AS DIRECTED  . rosuvastatin (CRESTOR) 10 MG tablet Take 1 tablet (10 mg total) by mouth once a week.  . terazosin (HYTRIN) 5 MG capsule Take 5 mg by mouth at bedtime.   No facility-administered encounter medications on file as of 11/24/2020.    Functional Status:  In your present state of health, do you have any difficulty performing the following activities: 08/08/2020  Hearing? N  Vision? N  Difficulty concentrating or making decisions? N  Walking or climbing stairs? Y  Dressing or bathing? N  Some recent data might be hidden    Fall/Depression Screening: Fall Risk  11/24/2020 08/26/2020 10/12/2019  Falls in the past year? 0 0 0  Number falls in past yr: 0 0 0  Injury with Fall? 0 - 0  Risk for fall due to : Impaired mobility;Impaired balance/gait Impaired balance/gait;Impaired mobility Impaired balance/gait;Impaired mobility  Follow up Falls evaluation completed Falls evaluation completed;Falls prevention discussed Falls evaluation completed;Falls prevention discussed;Education provided   [facility] 2/9 Scores 10/12/2019  PHQ - 2  Score 0    Assessment:  Goals Addressed            This Visit's Progress   . ( THN)Monitor and Manage My Blood Sugar   On track    Timeframe:  Long-Range Goal Priority:  Medium Start Date:  [id number]                        Expected End Date:  [id number]                    Follow Up Date [id number]   - check blood sugar at prescribed times - check blood sugar if I feel it is too high or too low - enter blood sugar readings and medication or insulin into daily log - take the blood sugar log to  all doctor visits - take the blood sugar meter to all doctor visits    Why is this important?   Checking your blood sugar at home helps to keep it from getting very high or very low.  Writing the results in a diary or log helps the doctor know how to care for you.  Your blood sugar log should have the time, date and the results.  Also, write down the amount of insulin or other medicine that you take.  Other information, like what you ate, exercise done and how you were feeling, will also be helpful.     Notes:     . [facility]) Make and Keep All Appointments   Not on track    Timeframe:  Long-Range Goal Priority:  Medium Start Date:   [id number]                          Expected End Date:   [id number]                   Follow Up Date [id number]   - call to cancel if needed - keep a calendar with prescription refill dates - keep a calendar with appointment dates    Why is this important?   Part of staying healthy is seeing the doctor for follow-up care.  If you forget your appointments, there are some things you can do to stay on track.    Notes:  Need to schedule exam    . (THN)Perform Foot Care   On track    Timeframe:  Long-Range Goal Priority:  Medium Start Date:  [id number]                           Expected End Date:     [id number]                 Follow Up Date [id number]   - check feet daily for cuts, sores or redness - keep feet up while sitting - trim toenails straight across - wash and dry feet carefully every day - wear comfortable, cotton socks - wear comfortable, well-fitting shoes    Why is this important?   Good foot care is very important when you have diabetes.  There are many things you can do to keep your feet healthy and catch a problem early.    Notes:  Patient has ulcers on toes. Great toe has lost its toenail and is now draining [id number] foot is healing some    . (THN)THN Set My Target A1C   On track  Timeframe:  Long-Range Goal Priority:  High Start  Date:  [id number]                           Expected End Date:    [id number]                  Follow Up Date [id number]   - set target A1C    Why is this important?   Your target A1C is decided together by you and your doctor.  It is based on several things like your age and other health issues.    Notes: 7.0       Plan:  Follow-up:  Patient agrees to Care Plan and Follow-up. Patient is to follow up with eye exam Patient will follow up on getting a soft cast shoe for foot RN will follow up within the month of August RN sent update assessment to PCP  [place] [facility] [phone]

## 2020-11-24 NOTE — Patient Outreach (Signed)
[facility]) Care Management  11/24/2020  Keith Espinoza April 02, 1941 [id number]   RN Health Coach attempted follow up outreach call to patient.  Patient was unavailable. HIPPA compliance voicemail message left with return callback number.  Plan: RN will call patient again within 30 days.  [place] Care Management [phone]

## 2020-12-01 DIAGNOSIS — N400 Enlarged prostate without lower urinary tract symptoms: Secondary | ICD-10-CM | POA: Diagnosis not present

## 2020-12-01 DIAGNOSIS — E559 Vitamin D deficiency, unspecified: Secondary | ICD-10-CM | POA: Diagnosis not present

## 2020-12-01 DIAGNOSIS — E1140 Type 2 diabetes mellitus with diabetic neuropathy, unspecified: Secondary | ICD-10-CM | POA: Diagnosis not present

## 2020-12-01 DIAGNOSIS — E1151 Type 2 diabetes mellitus with diabetic peripheral angiopathy without gangrene: Secondary | ICD-10-CM | POA: Diagnosis not present

## 2020-12-01 DIAGNOSIS — E1342 Other specified diabetes mellitus with diabetic polyneuropathy: Secondary | ICD-10-CM | POA: Diagnosis not present

## 2020-12-01 DIAGNOSIS — Z0001 Encounter for general adult medical examination with abnormal findings: Secondary | ICD-10-CM | POA: Diagnosis not present

## 2020-12-01 DIAGNOSIS — I10 Essential (primary) hypertension: Secondary | ICD-10-CM | POA: Diagnosis not present

## 2020-12-01 DIAGNOSIS — Z79899 Other long term (current) drug therapy: Secondary | ICD-10-CM | POA: Diagnosis not present

## 2020-12-01 DIAGNOSIS — M109 Gout, unspecified: Secondary | ICD-10-CM | POA: Diagnosis not present

## 2020-12-01 DIAGNOSIS — E785 Hyperlipidemia, unspecified: Secondary | ICD-10-CM | POA: Diagnosis not present

## 2020-12-01 DIAGNOSIS — E1165 Type 2 diabetes mellitus with hyperglycemia: Secondary | ICD-10-CM | POA: Diagnosis not present

## 2020-12-01 DIAGNOSIS — K219 Gastro-esophageal reflux disease without esophagitis: Secondary | ICD-10-CM | POA: Diagnosis not present

## 2020-12-01 DIAGNOSIS — I25118 Atherosclerotic heart disease of native coronary artery with other forms of angina pectoris: Secondary | ICD-10-CM | POA: Diagnosis not present

## 2020-12-01 DIAGNOSIS — Z7984 Long term (current) use of oral hypoglycemic drugs: Secondary | ICD-10-CM | POA: Diagnosis not present

## 2020-12-03 ENCOUNTER — Encounter (HOSPITAL_COMMUNITY): Payer: Medicare Other

## 2020-12-03 ENCOUNTER — Ambulatory Visit: Payer: Medicare Other | Admitting: Vascular Surgery

## 2020-12-11 ENCOUNTER — Other Ambulatory Visit (HOSPITAL_COMMUNITY)
Admission: RE | Admit: 2020-12-11 | Discharge: 2020-12-11 | Disposition: A | Discharge status: Home / Self Care | Payer: Medicare Other | Source: Ambulatory Visit | Attending: Orthopedic Surgery | Admitting: Orthopedic Surgery

## 2020-12-11 ENCOUNTER — Encounter (HOSPITAL_COMMUNITY): Payer: Self-pay | Admitting: Orthopedic Surgery

## 2020-12-11 ENCOUNTER — Encounter: Payer: Self-pay | Admitting: Orthopedic Surgery

## 2020-12-11 ENCOUNTER — Ambulatory Visit (INDEPENDENT_AMBULATORY_CARE_PROVIDER_SITE_OTHER): Payer: Medicare Other | Admitting: Orthopedic Surgery

## 2020-12-11 ENCOUNTER — Other Ambulatory Visit: Payer: Self-pay | Admitting: Physician Assistant

## 2020-12-11 DIAGNOSIS — I96 Gangrene, not elsewhere classified: Secondary | ICD-10-CM

## 2020-12-11 DIAGNOSIS — L97909 Non-pressure chronic ulcer of unspecified part of unspecified lower leg with unspecified severity: Secondary | ICD-10-CM | POA: Diagnosis not present

## 2020-12-11 DIAGNOSIS — Z01812 Encounter for preprocedural laboratory examination: Secondary | ICD-10-CM | POA: Insufficient documentation

## 2020-12-11 DIAGNOSIS — Z20822 Contact with and (suspected) exposure to covid-19: Secondary | ICD-10-CM | POA: Insufficient documentation

## 2020-12-11 DIAGNOSIS — I70299 Other atherosclerosis of native arteries of extremities, unspecified extremity: Secondary | ICD-10-CM

## 2020-12-11 LAB — SARS CORONAVIRUS 2 (TAT 6-24 HRS): SARS Coronavirus 2: NEGATIVE

## 2020-12-11 NOTE — Progress Notes (Signed)
Office Visit Note   Patient: Keith Espinoza           Date of Birth: [birth year]           MRN: [id number] Visit Date: 12/11/2020              Requested by: [name], MD [address] [place] [address] [place],  [place] [address] PCP: [name], MD  Chief Complaint  Patient presents with  . Right Foot - Open Wound      HPI: Patient is an 80 year old gentleman who is seen 4 urgent work and today.  Patient complains of increasing ischemic pain in his right foot.  Patient states he has been followed closely with vascular vein surgery he is status post angioplasty and patient states he has been recently told that his circulation is as good as it will get.  Patient also complains of some numbness in his left hand.  He is status post bilateral carpal tunnel releases.  Patient states he is status post a recent fall on the left side but denies any neck pain.  Is a 81 year old gentleman   Assessment & Plan: Visit Diagnoses:  1. Gangrene of right foot ([place])     Plan: With the cellulitis extending up to his ankle and advanced ischemic pain I recommended proceeding with a below the knee amputation on the right.  Patient is extremely motivated I feel that he should be able to proceed with inpatient rehab.  Due to the increased pain patient wished to proceed with surgery tomorrow.  Follow-Up Instructions: Return in about 2 weeks (around 12/25/2020).   Ortho Exam  Patient is alert, oriented, no adenopathy, well-dressed, normal affect, normal respiratory effort. Examination patient has progressive dry gangrenous changes plantar aspect of the great toe as well as increased ischemic changes to the lesser toes.  There is cellulitis advancing to his ankle.  Patient does not have palpable pulses his capillary refill is 4 seconds in the great toe.  Examination of the left hand he has good radial and ulnar pulses he has good range of motion no triggering of his fingers no ischemic changes in his hand no  focal motor weakness possibly nerve irritation from his recent fall.  Imaging: No results found. No images are attached to the encounter.  Labs: Lab Results  Component Value Date   HGBA1C 6.6 (H) 04/10/2019   HGBA1C 7.0 (H) 07/14/2016   HGBA1C 6.9 (H) 04/05/2014   REPTSTATUS 04/18/2019 FINAL 04/13/2019   GRAMSTAIN  04/13/2019    RARE WBC PRESENT, PREDOMINANTLY PMN NO ORGANISMS SEEN    CULT  04/13/2019    No growth aerobically or anaerobically. Performed at [facility], [place] [address], [place], [place] [address]      Lab Results  Component Value Date   ALBUMIN 4.4 04/10/2019   ALBUMIN 4.2 07/14/2016   ALBUMIN 4.4 11/15/2011    No results found for: MG No results found for: VD25OH  No results found for: PREALBUMIN CBC EXTENDED Latest Ref Rng & Units 08/08/2020 04/15/2019 04/14/2019  WBC 4.0 - 10.5 K/uL - 8.4 5.5  RBC 4.22 - 5.81 MIL/uL - 2.99(L) 3.15(L)  HGB 13.0 - 17.0 g/dL 11.2(L) 9.6(L) 10.2(L)  HCT 39.0 - 52.0 % 33.0(L) 27.5(L) 29.7(L)  PLT 150 - 400 K/uL - 119(L) 98(L)  NEUTROABS 1.7 - 7.7 K/uL - - -  LYMPHSABS 0.7 - 4.0 K/uL - - -     There is no height or weight on  file to calculate BMI.  Orders:  No orders of the defined types were placed in this encounter.  No orders of the defined types were placed in this encounter.    Procedures: No procedures performed  Clinical Data: No additional findings.  ROS:  All other systems negative, except as noted in the HPI. Review of Systems  Objective: Vital Signs: There were no vitals taken for this visit.  Specialty Comments:  No specialty comments available.  PMFS History: Patient Active Problem List   Diagnosis Date Noted  . Diabetic neuropathy ([place]) 11/17/2020  . Hyperglycemia due to type 2 diabetes mellitus ([place]) 11/17/2020  . Long term (current) use of insulin ([place]) 11/17/2020  . Obesity 11/17/2020  . 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/2019  . Hip dislocation, right ([place]) 04/13/2019  .  Other intervertebral disc degeneration, lumbar region 03/30/2019  . CAD (coronary artery disease) 01/30/2019  . Tobacco abuse 01/30/2019  . 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  . Burn, foot, second degree, left, initial encounter 06/08/2017  . 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 S/P total knee arthroplasty, left 10/26/2016  . Hyperlipidemia 09/04/2014  . Thrombocytopenia ([place])   . Precordial chest pain 04/05/2014  . Coronary atherosclerosis of native coronary artery 10/01/2013  . Other and unspecified hyperlipidemia 10/01/2013  . Essential hypertension, benign 10/01/2013  . Insulin dependent type 2 diabetes mellitus ([place]) 10/01/2013  . Esophageal reflux 10/01/2013  . Hypertrophy of prostate without urinary obstruction and other lower urinary tract symptoms (LUTS) 10/01/2013   Past Medical History:  Diagnosis Date  . Allergic rhinitis   . Allergic rhinitis   . Arthritis   . Basal cell carcinoma 11/01/2019    bcc left chest treatment TX cx3 [id number]   . Chronic leg pain    right  . Chronic lower back pain   . Coronary artery disease    a. Stenting to RCA 2004; staged DES to LAD and Cx 2004. DES to mRCA 2012. b. DES to mCx, PTCA to dCx 11/2011. c. Lateral wall MI 2013 s/p PTCA to distal Cx & DES to mid OM2 11/2011. d. Low risk nuc 04/2014, EF wnl.  . Diabetes mellitus    Insulin dependent  . Diabetic neuropathy (H[facility])    MILD  . Diverticulosis   . Dysrhythmia   . Gilbert syndrome   . Gout    right wrist; right foot; right elbow; have had it since 1970's  . H/O hiatal hernia   . Heart murmur   . History of echocardiogram    aortic sclerosis per echo 12/09 EF 65%, otherwise normal  . History of hemorrhoids    BLEEDING  . History of kidney stones    h/o  . Hypertension    Diagnosed 1995   . Pancreatic pseudocyst    a. s/p remote drainage 2006.  Marland Kitchen Thrombocytopenia ([place])    Seen on oldest labs in system from 2004  . Vitamin B 12 deficiency     orally replaced    Family History  Problem Relation Age of Onset  . Diabetes Mother   . Hyperlipidemia Mother   . Hypertension Mother   . Cancer Father   . Hypertension Father   . Diabetes Sister   . Hypertension Sister   . Cancer Brother   . Heart attack Neg Hx     Past Surgical History:  Procedure Laterality Date  . ABDOMINAL AORTOGRAM W/LOWER EXTREMITY Bilateral 08/08/2020   Procedure: ABDOMINAL AORTOGRAM W/LOWER EXTREMITY;  Surgeon:  Angelia Mould, MD;  Location: [facility];  Service: Cardiovascular;  Laterality: Bilateral;  . BACK SURGERY     "total of 3 times" S/P fall   . CARPAL TUNNEL RELEASE Bilateral   . CHOLECYSTECTOMY  1990's  . COLONOSCOPY    . CORONARY ANGIOPLASTY  11/11/11  . CORONARY ANGIOPLASTY WITH STENT PLACEMENT  09/30/2011   "1 then; makes a total of 4"  . CORONARY ANGIOPLASTY WITH STENT PLACEMENT  11/11/11   "1; makes a total of 5"  . INGUINAL HERNIA REPAIR  2003   right  . JOINT REPLACEMENT Right 04/03/2002   hip replacment  . KNEE ARTHROSCOPY  1990's   left  . LEFT HEART CATHETERIZATION WITH CORONARY ANGIOGRAM N/A 09/30/2011   Procedure: LEFT HEART CATHETERIZATION WITH CORONARY ANGIOGRAM;  Surgeon: Jettie Booze, MD;  Location: [facility] CATH LAB;  Service: Cardiovascular;  Laterality: N/A;  possible PCI  . LEFT HEART CATHETERIZATION WITH CORONARY ANGIOGRAM N/A 11/15/2011   Procedure: LEFT HEART CATHETERIZATION WITH CORONARY ANGIOGRAM;  Surgeon: Jettie Booze, MD;  Location: [facility] CATH LAB;  Service: Cardiovascular;  Laterality: N/A;  . PERCUTANEOUS CORONARY STENT INTERVENTION (PCI-S)  09/30/2011   Procedure: PERCUTANEOUS CORONARY STENT INTERVENTION (PCI-S);  Surgeon: Jettie Booze, MD;  Location: [facility] CATH LAB;  Service: Cardiovascular;;  . PERCUTANEOUS CORONARY STENT INTERVENTION (PCI-S) N/A 11/11/2011   Procedure: PERCUTANEOUS CORONARY STENT INTERVENTION (PCI-S);  Surgeon: Jettie Booze, MD;  Location: [facility] CATH LAB;  Service:  Cardiovascular;  Laterality: N/A;  . PERIPHERAL VASCULAR BALLOON ANGIOPLASTY Right 08/08/2020   Procedure: PERIPHERAL VASCULAR BALLOON ANGIOPLASTY;  Surgeon: Angelia Mould, MD;  Location: [facility];  Service: Cardiovascular;  Laterality: Right;  Posterior tibial   . SHOULDER SURGERY Right    X 2  . TONSILLECTOMY  ~ 1948  . TOTAL HIP REVISION Right 04/13/2019   Procedure: RIGHT TOTAL HIP REVISION-POSTERIOR  APPROACH LATERAL;  Surgeon: Marybelle Killings, MD;  Location: [facility];  Service: Orthopedics;  Laterality: Right;  . TOTAL KNEE ARTHROPLASTY Left 07/23/2016   Procedure: LEFT TOTAL KNEE ARTHROPLASTY;  Surgeon: Marybelle Killings, MD;  Location: [facility] [facility];  Service: Orthopedics;  Laterality: Left;   Social History   Occupational History  . Occupation: Retired  Tobacco Use  . Smoking status: Former Research scientist (life sciences)  . Smokeless tobacco: Current User    Types: Chew  . Tobacco comment: quit 60 years ago  Vaping Use  . Vaping Use: Never used  Substance and Sexual Activity  . Alcohol use: No  . Drug use: No  . Sexual activity: Not Currently

## 2020-12-12 ENCOUNTER — Encounter (HOSPITAL_COMMUNITY): Payer: Self-pay | Admitting: Orthopedic Surgery

## 2020-12-12 ENCOUNTER — Inpatient Hospital Stay (HOSPITAL_COMMUNITY): Payer: Medicare Other | Admitting: Certified Registered Nurse Anesthetist

## 2020-12-12 ENCOUNTER — Encounter (HOSPITAL_COMMUNITY)
Admission: RE | Disposition: A | Discharge status: Rehabilitation Facility | Payer: Self-pay | Source: Home / Self Care | Attending: Orthopedic Surgery

## 2020-12-12 ENCOUNTER — Other Ambulatory Visit: Payer: Self-pay

## 2020-12-12 ENCOUNTER — Inpatient Hospital Stay (HOSPITAL_COMMUNITY)
Admission: RE | Admit: 2020-12-12 | Discharge: 2020-12-16 | DRG: 241 | Disposition: A | Discharge status: Rehabilitation Facility | Payer: Medicare Other | Attending: Orthopedic Surgery | Admitting: Orthopedic Surgery

## 2020-12-12 DIAGNOSIS — Z85828 Personal history of other malignant neoplasm of skin: Secondary | ICD-10-CM | POA: Diagnosis not present

## 2020-12-12 DIAGNOSIS — E861 Hypovolemia: Secondary | ICD-10-CM | POA: Diagnosis present

## 2020-12-12 DIAGNOSIS — S88111A Complete traumatic amputation at level between knee and ankle, right lower leg, initial encounter: Secondary | ICD-10-CM | POA: Diagnosis not present

## 2020-12-12 DIAGNOSIS — Z8249 Family history of ischemic heart disease and other diseases of the circulatory system: Secondary | ICD-10-CM | POA: Diagnosis not present

## 2020-12-12 DIAGNOSIS — Z20822 Contact with and (suspected) exposure to covid-19: Secondary | ICD-10-CM | POA: Diagnosis not present

## 2020-12-12 DIAGNOSIS — E1142 Type 2 diabetes mellitus with diabetic polyneuropathy: Secondary | ICD-10-CM | POA: Diagnosis not present

## 2020-12-12 DIAGNOSIS — E43 Unspecified severe protein-calorie malnutrition: Secondary | ICD-10-CM | POA: Diagnosis present

## 2020-12-12 DIAGNOSIS — I252 Old myocardial infarction: Secondary | ICD-10-CM

## 2020-12-12 DIAGNOSIS — Z955 Presence of coronary angioplasty implant and graft: Secondary | ICD-10-CM | POA: Diagnosis not present

## 2020-12-12 DIAGNOSIS — I10 Essential (primary) hypertension: Secondary | ICD-10-CM | POA: Diagnosis present

## 2020-12-12 DIAGNOSIS — E119 Type 2 diabetes mellitus without complications: Secondary | ICD-10-CM | POA: Diagnosis not present

## 2020-12-12 DIAGNOSIS — I739 Peripheral vascular disease, unspecified: Secondary | ICD-10-CM | POA: Diagnosis not present

## 2020-12-12 DIAGNOSIS — Z96641 Presence of right artificial hip joint: Secondary | ICD-10-CM | POA: Diagnosis present

## 2020-12-12 DIAGNOSIS — I2510 Atherosclerotic heart disease of native coronary artery without angina pectoris: Secondary | ICD-10-CM | POA: Diagnosis not present

## 2020-12-12 DIAGNOSIS — M1990 Unspecified osteoarthritis, unspecified site: Secondary | ICD-10-CM | POA: Diagnosis not present

## 2020-12-12 DIAGNOSIS — Z833 Family history of diabetes mellitus: Secondary | ICD-10-CM

## 2020-12-12 DIAGNOSIS — M109 Gout, unspecified: Secondary | ICD-10-CM | POA: Diagnosis present

## 2020-12-12 DIAGNOSIS — Z96652 Presence of left artificial knee joint: Secondary | ICD-10-CM | POA: Diagnosis present

## 2020-12-12 DIAGNOSIS — G546 Phantom limb syndrome with pain: Secondary | ICD-10-CM | POA: Diagnosis present

## 2020-12-12 DIAGNOSIS — J309 Allergic rhinitis, unspecified: Secondary | ICD-10-CM | POA: Diagnosis present

## 2020-12-12 DIAGNOSIS — Z888 Allergy status to other drugs, medicaments and biological substances status: Secondary | ICD-10-CM

## 2020-12-12 DIAGNOSIS — E1151 Type 2 diabetes mellitus with diabetic peripheral angiopathy without gangrene: Secondary | ICD-10-CM | POA: Diagnosis present

## 2020-12-12 DIAGNOSIS — E1152 Type 2 diabetes mellitus with diabetic peripheral angiopathy with gangrene: Secondary | ICD-10-CM | POA: Diagnosis not present

## 2020-12-12 DIAGNOSIS — G8918 Other acute postprocedural pain: Secondary | ICD-10-CM | POA: Diagnosis not present

## 2020-12-12 DIAGNOSIS — K649 Unspecified hemorrhoids: Secondary | ICD-10-CM | POA: Diagnosis present

## 2020-12-12 DIAGNOSIS — K5909 Other constipation: Secondary | ICD-10-CM | POA: Diagnosis present

## 2020-12-12 DIAGNOSIS — Z4781 Encounter for orthopedic aftercare following surgical amputation: Secondary | ICD-10-CM | POA: Diagnosis present

## 2020-12-12 DIAGNOSIS — Z83438 Family history of other disorder of lipoprotein metabolism and other lipidemia: Secondary | ICD-10-CM | POA: Diagnosis not present

## 2020-12-12 DIAGNOSIS — N179 Acute kidney failure, unspecified: Secondary | ICD-10-CM | POA: Diagnosis present

## 2020-12-12 DIAGNOSIS — I70261 Atherosclerosis of native arteries of extremities with gangrene, right leg: Secondary | ICD-10-CM | POA: Diagnosis not present

## 2020-12-12 DIAGNOSIS — D62 Acute posthemorrhagic anemia: Secondary | ICD-10-CM | POA: Diagnosis present

## 2020-12-12 DIAGNOSIS — G8929 Other chronic pain: Secondary | ICD-10-CM | POA: Diagnosis present

## 2020-12-12 DIAGNOSIS — I70221 Atherosclerosis of native arteries of extremities with rest pain, right leg: Secondary | ICD-10-CM | POA: Diagnosis not present

## 2020-12-12 DIAGNOSIS — I96 Gangrene, not elsewhere classified: Secondary | ICD-10-CM | POA: Diagnosis not present

## 2020-12-12 DIAGNOSIS — Z7984 Long term (current) use of oral hypoglycemic drugs: Secondary | ICD-10-CM | POA: Diagnosis not present

## 2020-12-12 DIAGNOSIS — Z794 Long term (current) use of insulin: Secondary | ICD-10-CM | POA: Diagnosis not present

## 2020-12-12 DIAGNOSIS — Z720 Tobacco use: Secondary | ICD-10-CM

## 2020-12-12 DIAGNOSIS — M545 Low back pain, unspecified: Secondary | ICD-10-CM | POA: Diagnosis present

## 2020-12-12 DIAGNOSIS — Z885 Allergy status to narcotic agent status: Secondary | ICD-10-CM | POA: Diagnosis not present

## 2020-12-12 DIAGNOSIS — E1140 Type 2 diabetes mellitus with diabetic neuropathy, unspecified: Secondary | ICD-10-CM | POA: Diagnosis present

## 2020-12-12 DIAGNOSIS — Z89511 Acquired absence of right leg below knee: Secondary | ICD-10-CM | POA: Diagnosis not present

## 2020-12-12 DIAGNOSIS — E876 Hypokalemia: Secondary | ICD-10-CM | POA: Diagnosis present

## 2020-12-12 DIAGNOSIS — Z87891 Personal history of nicotine dependence: Secondary | ICD-10-CM | POA: Diagnosis not present

## 2020-12-12 DIAGNOSIS — E871 Hypo-osmolality and hyponatremia: Secondary | ICD-10-CM | POA: Diagnosis not present

## 2020-12-12 HISTORY — DX: Acute myocardial infarction, unspecified: I21.9

## 2020-12-12 HISTORY — PX: AMPUTATION: SHX166

## 2020-12-12 HISTORY — DX: Complete traumatic amputation at level between knee and ankle, right lower leg, initial encounter: S88.111A

## 2020-12-12 LAB — BASIC METABOLIC PANEL
Anion gap: 19 — ABNORMAL HIGH (ref 5–15)
BUN: 31 mg/dL — ABNORMAL HIGH (ref 8–23)
CO2: 18 mmol/L — ABNORMAL LOW (ref 22–32)
Calcium: 9.7 mg/dL (ref 8.9–10.3)
Chloride: 99 mmol/L (ref 98–111)
Creatinine, Ser: 1.41 mg/dL — ABNORMAL HIGH (ref 0.61–1.24)
GFR, Estimated: 50 mL/min — ABNORMAL LOW (ref >60)
Glucose, Bld: 250 mg/dL — ABNORMAL HIGH (ref 70–99)
Potassium: 5 mmol/L (ref 3.5–5.1)
Sodium: 136 mmol/L (ref 135–145)

## 2020-12-12 LAB — CBC
HCT: 39.1 % (ref 39.0–52.0)
Hemoglobin: 13.4 g/dL (ref 13.0–17.0)
MCH: 31.1 pg (ref 26.0–34.0)
MCHC: 34.3 g/dL (ref 30.0–36.0)
MCV: 90.7 fL (ref 80.0–100.0)
Platelets: 190 10*3/uL (ref 150–400)
RBC: 4.31 MIL/uL (ref 4.22–5.81)
RDW: 14.6 % (ref 11.5–15.5)
WBC: 10.4 10*3/uL (ref 4.0–10.5)
nRBC: 0 % (ref 0.0–0.2)

## 2020-12-12 LAB — GLUCOSE, CAPILLARY
Glucose-Capillary: 252 mg/dL — ABNORMAL HIGH (ref 70–99)
Glucose-Capillary: 220 mg/dL — ABNORMAL HIGH (ref 70–99)
Glucose-Capillary: 240 mg/dL — ABNORMAL HIGH (ref 70–99)
Glucose-Capillary: 205 mg/dL — ABNORMAL HIGH (ref 70–99)
Glucose-Capillary: 157 mg/dL — ABNORMAL HIGH (ref 70–99)
Glucose-Capillary: 384 mg/dL — ABNORMAL HIGH (ref 70–99)

## 2020-12-12 LAB — TYPE AND SCREEN
ABO/RH(D): O POS
Antibody Screen: NEGATIVE

## 2020-12-12 LAB — HEMOGLOBIN A1C
Hgb A1c MFr Bld: 7.7 % — ABNORMAL HIGH (ref 4.8–5.6)
Mean Plasma Glucose: 174.29 mg/dL

## 2020-12-12 SURGERY — AMPUTATION BELOW KNEE
Anesthesia: General | Site: Knee | Laterality: Right

## 2020-12-12 MED ORDER — CHLORHEXIDINE GLUCONATE 0.12 % MT SOLN
OROMUCOSAL | Status: AC
  Administered 2020-12-12: 15 mL via OROMUCOSAL
  Filled 2020-12-12: qty 15

## 2020-12-12 MED ORDER — TERAZOSIN HCL 5 MG PO CAPS
5.0000 mg | ORAL_CAPSULE | Freq: Every day | ORAL | Status: DC
  Administered 2020-12-12 – 2020-12-15 (×4): 5 mg via ORAL
  Filled 2020-12-12 (×5): qty 1

## 2020-12-12 MED ORDER — ONDANSETRON HCL 4 MG/2ML IJ SOLN
INTRAMUSCULAR | Status: AC
  Filled 2020-12-12: qty 2

## 2020-12-12 MED ORDER — PROPOFOL 10 MG/ML IV BOLUS
INTRAVENOUS | Status: DC | PRN
  Administered 2020-12-12: 150 mg via INTRAVENOUS

## 2020-12-12 MED ORDER — PHENYLEPHRINE 40 MCG/ML (10ML) SYRINGE FOR IV PUSH (FOR BLOOD PRESSURE SUPPORT)
PREFILLED_SYRINGE | INTRAVENOUS | Status: AC
  Filled 2020-12-12: qty 10

## 2020-12-12 MED ORDER — METOCLOPRAMIDE HCL 5 MG/ML IJ SOLN: 5.0000 mg | Freq: Three times a day (TID) | INTRAMUSCULAR | Status: DC | PRN

## 2020-12-12 MED ORDER — SODIUM CHLORIDE 0.9 % IV SOLN: INTRAVENOUS | Status: DC

## 2020-12-12 MED ORDER — GABAPENTIN 300 MG PO CAPS
300.0000 mg | ORAL_CAPSULE | Freq: Two times a day (BID) | ORAL | Status: DC
  Administered 2020-12-12 – 2020-12-16 (×8): 300 mg via ORAL
  Filled 2020-12-12 (×8): qty 1

## 2020-12-12 MED ORDER — HYDROCHLOROTHIAZIDE 25 MG PO TABS
12.5000 mg | ORAL_TABLET | Freq: Every day | ORAL | Status: DC
  Administered 2020-12-13 – 2020-12-16 (×4): 12.5 mg via ORAL
  Filled 2020-12-12 (×4): qty 1

## 2020-12-12 MED ORDER — VITAMIN B-12 1000 MCG PO TABS
2500.0000 ug | ORAL_TABLET | Freq: Every day | ORAL | Status: DC
  Administered 2020-12-12 – 2020-12-16 (×5): 2500 ug via ORAL
  Filled 2020-12-12 (×5): qty 3

## 2020-12-12 MED ORDER — LIDOCAINE 2% (20 MG/ML) 5 ML SYRINGE
INTRAMUSCULAR | Status: AC
  Filled 2020-12-12: qty 5

## 2020-12-12 MED ORDER — LIDOCAINE-EPINEPHRINE 2 %-1:100000 IJ SOLN
INTRAMUSCULAR | Status: DC | PRN
  Administered 2020-12-12: 20 mL via PERINEURAL

## 2020-12-12 MED ORDER — OXYCODONE HCL 5 MG/5ML PO SOLN: 5.0000 mg | Freq: Once | ORAL | Status: DC | PRN

## 2020-12-12 MED ORDER — DEXAMETHASONE SODIUM PHOSPHATE 10 MG/ML IJ SOLN
INTRAMUSCULAR | Status: AC
  Filled 2020-12-12: qty 1

## 2020-12-12 MED ORDER — METOCLOPRAMIDE HCL 5 MG PO TABS: 5.0000 mg | ORAL_TABLET | Freq: Three times a day (TID) | ORAL | Status: DC | PRN

## 2020-12-12 MED ORDER — PROPOFOL 10 MG/ML IV BOLUS
INTRAVENOUS | Status: AC
  Filled 2020-12-12: qty 20

## 2020-12-12 MED ORDER — INSULIN ASPART 100 UNIT/ML ~~LOC~~ SOLN
4.0000 [IU] | Freq: Three times a day (TID) | SUBCUTANEOUS | Status: DC
  Administered 2020-12-12 – 2020-12-16 (×12): 4 [IU] via SUBCUTANEOUS

## 2020-12-12 MED ORDER — INSULIN ASPART 100 UNIT/ML ~~LOC~~ SOLN
SUBCUTANEOUS | Status: AC
  Filled 2020-12-12: qty 1

## 2020-12-12 MED ORDER — METHOCARBAMOL 1000 MG/10ML IJ SOLN
500.0000 mg | Freq: Four times a day (QID) | INTRAVENOUS | Status: DC | PRN
  Filled 2020-12-12: qty 5

## 2020-12-12 MED ORDER — MAGNESIUM CITRATE PO SOLN: 1.0000 | Freq: Once | ORAL | Status: DC | PRN

## 2020-12-12 MED ORDER — INSULIN DETEMIR 100 UNIT/ML ~~LOC~~ SOLN
10.0000 [IU] | Freq: Every day | SUBCUTANEOUS | Status: DC
  Administered 2020-12-12 – 2020-12-15 (×4): 10 [IU] via SUBCUTANEOUS
  Filled 2020-12-12 (×5): qty 0.1

## 2020-12-12 MED ORDER — CHLORHEXIDINE GLUCONATE 0.12 % MT SOLN
15.0000 mL | OROMUCOSAL | Status: AC
  Filled 2020-12-12: qty 15

## 2020-12-12 MED ORDER — INSULIN ASPART 100 UNIT/ML ~~LOC~~ SOLN
10.0000 [IU] | Freq: Once | SUBCUTANEOUS | Status: AC
  Administered 2020-12-12: 10 [IU] via SUBCUTANEOUS

## 2020-12-12 MED ORDER — NITROGLYCERIN 0.4 MG SL SUBL: 0.4000 mg | SUBLINGUAL_TABLET | SUBLINGUAL | Status: DC | PRN

## 2020-12-12 MED ORDER — POLYETHYLENE GLYCOL 3350 17 G PO PACK: 17.0000 g | PACK | Freq: Every day | ORAL | Status: DC | PRN

## 2020-12-12 MED ORDER — LACTATED RINGERS IV SOLN: INTRAVENOUS | Status: DC

## 2020-12-12 MED ORDER — CEFAZOLIN SODIUM-DEXTROSE 2-4 GM/100ML-% IV SOLN
INTRAVENOUS | Status: AC
  Filled 2020-12-12: qty 100

## 2020-12-12 MED ORDER — CLOPIDOGREL BISULFATE 75 MG PO TABS
75.0000 mg | ORAL_TABLET | Freq: Every day | ORAL | Status: DC
  Administered 2020-12-12 – 2020-12-16 (×5): 75 mg via ORAL
  Filled 2020-12-12 (×5): qty 1

## 2020-12-12 MED ORDER — DEXAMETHASONE SODIUM PHOSPHATE 10 MG/ML IJ SOLN
INTRAMUSCULAR | Status: DC | PRN
  Administered 2020-12-12: 10 mg via INTRAVENOUS

## 2020-12-12 MED ORDER — FENTANYL CITRATE (PF) 250 MCG/5ML IJ SOLN
INTRAMUSCULAR | Status: DC | PRN
  Administered 2020-12-12: 25 ug via INTRAVENOUS

## 2020-12-12 MED ORDER — ROPIVACAINE HCL 5 MG/ML IJ SOLN
INTRAMUSCULAR | Status: DC | PRN
  Administered 2020-12-12: 40 mL via PERINEURAL

## 2020-12-12 MED ORDER — PHENYLEPHRINE 40 MCG/ML (10ML) SYRINGE FOR IV PUSH (FOR BLOOD PRESSURE SUPPORT)
PREFILLED_SYRINGE | INTRAVENOUS | Status: DC | PRN
  Administered 2020-12-12: 40 ug via INTRAVENOUS
  Administered 2020-12-12: 80 ug via INTRAVENOUS

## 2020-12-12 MED ORDER — POTASSIUM CHLORIDE CRYS ER 20 MEQ PO TBCR
20.0000 meq | EXTENDED_RELEASE_TABLET | Freq: Every day | ORAL | Status: DC
  Administered 2020-12-13 – 2020-12-15 (×3): 20 meq via ORAL
  Filled 2020-12-12 (×3): qty 1

## 2020-12-12 MED ORDER — HYDROCODONE-ACETAMINOPHEN 5-325 MG PO TABS
1.0000 | ORAL_TABLET | ORAL | Status: DC | PRN
  Administered 2020-12-12: 1 via ORAL
  Administered 2020-12-13: 2 via ORAL
  Filled 2020-12-12: qty 2
  Filled 2020-12-12: qty 1

## 2020-12-12 MED ORDER — CEFAZOLIN SODIUM-DEXTROSE 2-4 GM/100ML-% IV SOLN
2.0000 g | INTRAVENOUS | Status: AC
  Administered 2020-12-12: 2 g via INTRAVENOUS

## 2020-12-12 MED ORDER — METHOCARBAMOL 500 MG PO TABS
500.0000 mg | ORAL_TABLET | Freq: Four times a day (QID) | ORAL | Status: DC | PRN
  Administered 2020-12-12 – 2020-12-16 (×6): 500 mg via ORAL
  Filled 2020-12-12 (×7): qty 1

## 2020-12-12 MED ORDER — 0.9 % SODIUM CHLORIDE (POUR BTL) OPTIME
TOPICAL | Status: DC | PRN
  Administered 2020-12-12: 1000 mL

## 2020-12-12 MED ORDER — LOSARTAN POTASSIUM 50 MG PO TABS
50.0000 mg | ORAL_TABLET | Freq: Every day | ORAL | Status: DC
  Administered 2020-12-13 – 2020-12-16 (×4): 50 mg via ORAL
  Filled 2020-12-12 (×4): qty 1

## 2020-12-12 MED ORDER — ALLOPURINOL 300 MG PO TABS
300.0000 mg | ORAL_TABLET | Freq: Every day | ORAL | Status: DC
  Administered 2020-12-13 – 2020-12-16 (×4): 300 mg via ORAL
  Filled 2020-12-12 (×4): qty 1

## 2020-12-12 MED ORDER — MORPHINE SULFATE (PF) 2 MG/ML IV SOLN: 1.0000 mg | INTRAVENOUS | Status: DC | PRN

## 2020-12-12 MED ORDER — MORPHINE SULFATE (PF) 2 MG/ML IV SOLN
0.5000 mg | INTRAVENOUS | Status: DC | PRN
Start: 2020-12-12 — End: 2020-12-16
  Administered 2020-12-12 – 2020-12-13 (×2): 0.5 mg via INTRAVENOUS
  Administered 2020-12-13 – 2020-12-15 (×3): 1 mg via INTRAVENOUS
  Filled 2020-12-12 (×6): qty 1

## 2020-12-12 MED ORDER — ONDANSETRON HCL 4 MG/2ML IJ SOLN
INTRAMUSCULAR | Status: DC | PRN
  Administered 2020-12-12: 4 mg via INTRAVENOUS

## 2020-12-12 MED ORDER — FENTANYL CITRATE (PF) 100 MCG/2ML IJ SOLN: 75.0000 ug | Freq: Once | INTRAMUSCULAR | Status: AC

## 2020-12-12 MED ORDER — EMPAGLIFLOZIN 25 MG PO TABS
25.0000 mg | ORAL_TABLET | Freq: Every day | ORAL | Status: DC
  Administered 2020-12-13 – 2020-12-16 (×4): 25 mg via ORAL
  Filled 2020-12-12 (×5): qty 1

## 2020-12-12 MED ORDER — HYDROCODONE-ACETAMINOPHEN 7.5-325 MG PO TABS
1.0000 | ORAL_TABLET | ORAL | Status: DC | PRN
  Administered 2020-12-13: 1 via ORAL
  Administered 2020-12-13 (×2): 2 via ORAL
  Filled 2020-12-12 (×3): qty 2
  Filled 2020-12-12: qty 1

## 2020-12-12 MED ORDER — ONDANSETRON HCL 4 MG/2ML IJ SOLN: 4.0000 mg | Freq: Four times a day (QID) | INTRAMUSCULAR | Status: DC | PRN

## 2020-12-12 MED ORDER — ONDANSETRON HCL 4 MG PO TABS: 4.0000 mg | ORAL_TABLET | Freq: Four times a day (QID) | ORAL | Status: DC | PRN

## 2020-12-12 MED ORDER — ACETAMINOPHEN 325 MG PO TABS
325.0000 mg | ORAL_TABLET | Freq: Four times a day (QID) | ORAL | Status: DC | PRN
  Administered 2020-12-13 – 2020-12-14 (×2): 650 mg via ORAL
  Administered 2020-12-16: 325 mg via ORAL
  Filled 2020-12-12: qty 2
  Filled 2020-12-12: qty 1
  Filled 2020-12-12: qty 2

## 2020-12-12 MED ORDER — ISOSORBIDE MONONITRATE ER 30 MG PO TB24
30.0000 mg | ORAL_TABLET | Freq: Every day | ORAL | Status: DC
  Administered 2020-12-12 – 2020-12-16 (×5): 30 mg via ORAL
  Filled 2020-12-12 (×5): qty 1

## 2020-12-12 MED ORDER — LIDOCAINE 2% (20 MG/ML) 5 ML SYRINGE
INTRAMUSCULAR | Status: DC | PRN
  Administered 2020-12-12: 100 mg via INTRAVENOUS

## 2020-12-12 MED ORDER — BISACODYL 10 MG RE SUPP: 10.0000 mg | Freq: Every day | RECTAL | Status: DC | PRN

## 2020-12-12 MED ORDER — DOCUSATE SODIUM 100 MG PO CAPS
100.0000 mg | ORAL_CAPSULE | Freq: Two times a day (BID) | ORAL | Status: DC
  Administered 2020-12-12 – 2020-12-16 (×8): 100 mg via ORAL
  Filled 2020-12-12 (×8): qty 1

## 2020-12-12 MED ORDER — FENTANYL CITRATE (PF) 250 MCG/5ML IJ SOLN
INTRAMUSCULAR | Status: AC
  Filled 2020-12-12: qty 5

## 2020-12-12 MED ORDER — LACTATED RINGERS IV SOLN: INTRAVENOUS | Status: DC | PRN

## 2020-12-12 MED ORDER — AMLODIPINE BESYLATE 5 MG PO TABS
5.0000 mg | ORAL_TABLET | Freq: Every day | ORAL | Status: DC
  Administered 2020-12-12 – 2020-12-16 (×5): 5 mg via ORAL
  Filled 2020-12-12 (×5): qty 1

## 2020-12-12 MED ORDER — METFORMIN HCL 500 MG PO TABS
1000.0000 mg | ORAL_TABLET | Freq: Two times a day (BID) | ORAL | Status: DC
  Administered 2020-12-12 – 2020-12-16 (×8): 1000 mg via ORAL
  Filled 2020-12-12 (×8): qty 2

## 2020-12-12 MED ORDER — OXYCODONE HCL 5 MG PO TABS: 5.0000 mg | ORAL_TABLET | Freq: Once | ORAL | Status: DC | PRN

## 2020-12-12 MED ORDER — ASPIRIN EC 81 MG PO TBEC
81.0000 mg | DELAYED_RELEASE_TABLET | Freq: Every day | ORAL | Status: DC
  Administered 2020-12-12 – 2020-12-16 (×5): 81 mg via ORAL
  Filled 2020-12-12 (×5): qty 1

## 2020-12-12 MED ORDER — CEFAZOLIN SODIUM-DEXTROSE 1-4 GM/50ML-% IV SOLN
1.0000 g | Freq: Three times a day (TID) | INTRAVENOUS | Status: AC
  Administered 2020-12-12 – 2020-12-13 (×3): 1 g via INTRAVENOUS
  Filled 2020-12-12 (×4): qty 50

## 2020-12-12 MED ORDER — MIDAZOLAM HCL 2 MG/2ML IJ SOLN
INTRAMUSCULAR | Status: AC
  Filled 2020-12-12: qty 2

## 2020-12-12 MED ORDER — INSULIN ASPART 100 UNIT/ML ~~LOC~~ SOLN
0.0000 [IU] | Freq: Three times a day (TID) | SUBCUTANEOUS | Status: DC
  Administered 2020-12-12: 3 [IU] via SUBCUTANEOUS
  Administered 2020-12-13: 15 [IU] via SUBCUTANEOUS
  Administered 2020-12-13: 3 [IU] via SUBCUTANEOUS
  Administered 2020-12-13 – 2020-12-14 (×2): 5 [IU] via SUBCUTANEOUS
  Administered 2020-12-14: 2 [IU] via SUBCUTANEOUS
  Administered 2020-12-14: 3 [IU] via SUBCUTANEOUS
  Administered 2020-12-15 (×2): 5 [IU] via SUBCUTANEOUS
  Administered 2020-12-15: 8 [IU] via SUBCUTANEOUS
  Administered 2020-12-16: 2 [IU] via SUBCUTANEOUS

## 2020-12-12 MED ORDER — FENTANYL CITRATE (PF) 100 MCG/2ML IJ SOLN
INTRAMUSCULAR | Status: AC
  Administered 2020-12-12: 75 ug via INTRAVENOUS
  Filled 2020-12-12: qty 2

## 2020-12-12 MED ORDER — VITAMIN D3 25 MCG (1000 UNIT) PO TABS
2000.0000 [IU] | ORAL_TABLET | Freq: Every day | ORAL | Status: DC
  Administered 2020-12-12 – 2020-12-16 (×5): 2000 [IU] via ORAL
  Filled 2020-12-12 (×10): qty 2

## 2020-12-12 SURGICAL SUPPLY — 39 items
BLADE SAW RECIP 87.9 MT (BLADE) ×2 IMPLANT
BLADE SURG 21 STRL SS (BLADE) ×2 IMPLANT
BNDG COHESIVE 6X5 TAN NS LF (GAUZE/BANDAGES/DRESSINGS) ×1 IMPLANT
BNDG COHESIVE 6X5 TAN STRL LF (GAUZE/BANDAGES/DRESSINGS) ×1 IMPLANT
CANISTER WOUND CARE 500ML ATS (WOUND CARE) ×2 IMPLANT
COVER SURGICAL LIGHT HANDLE (MISCELLANEOUS) ×2 IMPLANT
COVER WAND RF STERILE (DRAPES) IMPLANT
CUFF TOURN SGL QUICK 34 (TOURNIQUET CUFF) ×2
CUFF TRNQT CYL 34X4.125X (TOURNIQUET CUFF) ×1 IMPLANT
DRAPE DERMATAC (DRAPES) ×1 IMPLANT
DRAPE INCISE IOBAN 66X45 STRL (DRAPES) ×2 IMPLANT
DRAPE U-SHAPE 47X51 STRL (DRAPES) ×2 IMPLANT
DRESSING PREVENA PLUS CUSTOM (GAUZE/BANDAGES/DRESSINGS) ×1 IMPLANT
DRSG PREVENA PLUS CUSTOM (GAUZE/BANDAGES/DRESSINGS) ×2
DURAPREP 26ML APPLICATOR (WOUND CARE) ×2 IMPLANT
ELECT REM PT RETURN 9FT ADLT (ELECTROSURGICAL) ×2
ELECTRODE REM PT RTRN 9FT ADLT (ELECTROSURGICAL) ×1 IMPLANT
GLOVE BIOGEL PI IND STRL 9 (GLOVE) ×1 IMPLANT
GLOVE BIOGEL PI INDICATOR 9 (GLOVE) ×1
GLOVE SURG ORTHO 9.0 STRL STRW (GLOVE) ×2 IMPLANT
GOWN STRL REUS W/ TWL XL LVL3 (GOWN DISPOSABLE) ×2 IMPLANT
GOWN STRL REUS W/TWL XL LVL3 (GOWN DISPOSABLE) ×4
KIT BASIN OR (CUSTOM PROCEDURE TRAY) ×2 IMPLANT
KIT TURNOVER KIT B (KITS) ×2 IMPLANT
MANIFOLD NEPTUNE II (INSTRUMENTS) ×2 IMPLANT
NS IRRIG 1000ML POUR BTL (IV SOLUTION) ×2 IMPLANT
PACK ORTHO EXTREMITY (CUSTOM PROCEDURE TRAY) ×2 IMPLANT
PAD ARMBOARD 7.5X6 YLW CONV (MISCELLANEOUS) ×2 IMPLANT
PREVENA RESTOR ARTHOFORM 46X30 (CANNISTER) ×2 IMPLANT
SPONGE LAP 18X18 RF (DISPOSABLE) IMPLANT
STAPLER VISISTAT 35W (STAPLE) IMPLANT
STOCKINETTE IMPERVIOUS LG (DRAPES) ×2 IMPLANT
SUT ETHILON 2 0 PSLX (SUTURE) IMPLANT
SUT SILK 2 0 (SUTURE) ×2
SUT SILK 2-0 18XBRD TIE 12 (SUTURE) ×1 IMPLANT
SUT VIC AB 1 CTX 27 (SUTURE) ×4 IMPLANT
TOWEL GREEN STERILE (TOWEL DISPOSABLE) ×2 IMPLANT
TUBE CONNECTING 12X1/4 (SUCTIONS) ×2 IMPLANT
YANKAUER SUCT BULB TIP NO VENT (SUCTIONS) ×2 IMPLANT

## 2020-12-12 NOTE — Progress Notes (Signed)
Orthopedic Tech Progress Note Patient Details:  Keith Espinoza [birth year] [id number] Ordered brace Patient ID: [name], male   DOB: 11/07/1940, 80 y.o.   MRN: [id number]   [name] 12/12/2020, 6:26 PM

## 2020-12-12 NOTE — Anesthesia Procedure Notes (Signed)
Anesthesia Regional Block: Adductor canal block   Pre-Anesthetic Checklist: ,, timeout performed, Correct Patient, Correct Site, Correct Laterality, Correct Procedure, Correct Position, site marked, Risks and benefits discussed,  Surgical consent,  Pre-op evaluation,  At surgeon's request and post-op pain management  Laterality: Right  Prep: chloraprep       Needles:  Injection technique: Single-shot  Needle Type: Echogenic Needle     Needle Length: 9cm      Additional Needles:   Procedures:,,,, ultrasound used (permanent image in chart),,,,  Narrative:  Start time: 12/12/2020 1:25 PM End time: 12/12/2020 1:29 PM Injection made incrementally with aspirations every 5 mL.  Performed by: Personally  Anesthesiologist: Myrtie Soman, MD  Additional Notes: Patient tolerated the procedure well without complications

## 2020-12-12 NOTE — H&P (Signed)
Keith Espinoza is an 80 y.o. male.   Chief Complaint: gangrene Right Foot HPI: HPI: Patient is an 80 year old gentleman who is seen 4 urgent work and today.  Patient complains of increasing ischemic pain in his right foot.  Patient states he has been followed closely with vascular vein surgery he is status post angioplasty and patient states he has been recently told that his circulation is as good as it will get.  Patient also complains of some numbness in his left hand.  He is status post bilateral carpal tunnel releases.  Patient states he is status post a recent fall on the left side but denies any neck pain.  Is a 80 year old gentleman   Past Medical History:  Diagnosis Date  . Allergic rhinitis   . Allergic rhinitis   . Arthritis   . Basal cell carcinoma 11/01/2019    bcc left chest treatment [place] cx3 29fu   . Chronic leg pain    right  . Chronic lower back pain   . Coronary artery disease    a. Stenting to RCA 2004; staged DES to LAD and Cx 2004. DES to mRCA 2012. b. DES to mCx, PTCA to dCx 11/2011. c. Lateral wall MI 2013 s/p PTCA to distal Cx & DES to mid OM2 11/2011. d. Low risk nuc 04/2014, EF wnl.  . Diabetes mellitus    Insulin dependent  . Diabetic neuropathy (HCC)    MILD  . Diverticulosis   . Dysrhythmia   . Gilbert syndrome   . Gout    right wrist; right foot; right elbow; have had it since 1970's  . H/O hiatal hernia   . Heart murmur   . History of echocardiogram    aortic sclerosis per echo 12/09 EF 65%, otherwise normal  . History of hemorrhoids    BLEEDING  . History of kidney stones    h/o  . Hypertension    Diagnosed 1995   . Myocardial infarction ([place])   . Pancreatic pseudocyst    a. s/p remote drainage 2006.  Marland Kitchen Thrombocytopenia ([place])    Seen on oldest labs in system from 2004  . Vitamin B 12 deficiency    orally replaced    Past Surgical History:  Procedure Laterality Date  . ABDOMINAL AORTOGRAM W/LOWER EXTREMITY Bilateral 08/08/2020   Procedure:  ABDOMINAL AORTOGRAM W/LOWER EXTREMITY;  Surgeon: Angelia Mould, MD;  Location: [facility];  Service: Cardiovascular;  Laterality: Bilateral;  . BACK SURGERY     "total of 3 times" S/P fall   . CARPAL TUNNEL RELEASE Bilateral   . CHOLECYSTECTOMY  1990's  . COLONOSCOPY    . CORONARY ANGIOPLASTY  11/11/11  . CORONARY ANGIOPLASTY WITH STENT PLACEMENT  09/30/2011   "1 then; makes a total of 4"  . CORONARY ANGIOPLASTY WITH STENT PLACEMENT  11/11/11   "1; makes a total of 5"  . INGUINAL HERNIA REPAIR  2003   right  . JOINT REPLACEMENT Right 04/03/2002   hip replacment  . KNEE ARTHROSCOPY  1990's   left  . LEFT HEART CATHETERIZATION WITH CORONARY ANGIOGRAM N/A 09/30/2011   Procedure: LEFT HEART CATHETERIZATION WITH CORONARY ANGIOGRAM;  Surgeon: Jettie Booze, MD;  Location: [facility] CATH LAB;  Service: Cardiovascular;  Laterality: N/A;  possible PCI  . LEFT HEART CATHETERIZATION WITH CORONARY ANGIOGRAM N/A 11/15/2011   Procedure: LEFT HEART CATHETERIZATION WITH CORONARY ANGIOGRAM;  Surgeon: Jettie Booze, MD;  Location: [facility] CATH LAB;  Service: Cardiovascular;  Laterality: N/A;  .  PERCUTANEOUS CORONARY STENT INTERVENTION (PCI-S)  09/30/2011   Procedure: PERCUTANEOUS CORONARY STENT INTERVENTION (PCI-S);  Surgeon: Jettie Booze, MD;  Location: [facility] CATH LAB;  Service: Cardiovascular;;  . PERCUTANEOUS CORONARY STENT INTERVENTION (PCI-S) N/A 11/11/2011   Procedure: PERCUTANEOUS CORONARY STENT INTERVENTION (PCI-S);  Surgeon: Jettie Booze, MD;  Location: [facility] CATH LAB;  Service: Cardiovascular;  Laterality: N/A;  . PERIPHERAL VASCULAR BALLOON ANGIOPLASTY Right 08/08/2020   Procedure: PERIPHERAL VASCULAR BALLOON ANGIOPLASTY;  Surgeon: Angelia Mould, MD;  Location: [place] [facility];  Service: Cardiovascular;  Laterality: Right;  Posterior tibial   . SHOULDER SURGERY Right    X 2  . TONSILLECTOMY  ~ 1948  . TOTAL HIP REVISION Right 04/13/2019   Procedure: RIGHT TOTAL HIP  REVISION-POSTERIOR  APPROACH LATERAL;  Surgeon: Marybelle Killings, MD;  Location: [place];  Service: Orthopedics;  Laterality: Right;  . TOTAL KNEE ARTHROPLASTY Left 07/23/2016   Procedure: LEFT TOTAL KNEE ARTHROPLASTY;  Surgeon: Marybelle Killings, MD;  Location: [place];  Service: Orthopedics;  Laterality: Left;    Family History  Problem Relation Age of Onset  . Diabetes Mother   . Hyperlipidemia Mother   . Hypertension Mother   . Cancer Father   . Hypertension Father   . Diabetes Sister   . Hypertension Sister   . Cancer Brother   . Heart attack Neg Hx    Social History:  reports that he has quit smoking. His smokeless tobacco use includes chew. He reports that he does not drink alcohol and does not use drugs.  Allergies:  Allergies  Allergen Reactions  . Simvastatin Other (See Comments)    SEVERE MYALGIAS   . Zetia [Ezetimibe] Other (See Comments)    MYALGIAS  . Dilaudid [Hydromorphone Hcl] Other (See Comments)    hallucination  . Fish Oil Nausea Only    No medications prior to admission.    Results for orders placed or performed during the hospital encounter of 12/11/20 (from the past 48 hour(s))  SARS CORONAVIRUS 2 (TAT 6-24 HRS) Nasopharyngeal Nasopharyngeal Swab     Status: None   Collection Time: 12/11/20 11:03 AM   Specimen: Nasopharyngeal Swab  Result Value Ref Range   SARS Coronavirus 2 NEGATIVE NEGATIVE    Comment: (NOTE) SARS-CoV-2 target nucleic acids are NOT DETECTED.  The SARS-CoV-2 RNA is generally detectable in upper and lower respiratory specimens during the acute phase of infection. Negative results do not preclude SARS-CoV-2 infection, do not rule out co-infections with other pathogens, and should not be used as the sole basis for treatment or other patient management decisions. Negative results must be combined with clinical observations, patient history, and epidemiological information. The expected result is Negative.  Fact Sheet for  Patients: SugarRoll.be  Fact Sheet for Healthcare Providers: https://www.woods-mathews.com/  This test is not yet approved or cleared by the [place] FDA and  has been authorized for detection and/or diagnosis of SARS-CoV-2 by FDA under an Emergency Use Authorization (EUA). This EUA will remain  in effect (meaning this test can be used) for the duration of the COVID-19 declaration under Se ction 564(b)(1) of the Act, 21 U.S.C. section 360bbb-3(b)(1), unless the authorization is terminated or revoked sooner.  Performed at [facility], [place] [address] [place], [place] [address]    No results found.  Review of Systems  All other systems reviewed and are negative.   There were no vitals taken for this visit. Physical Exam  Patient is alert, oriented, no  adenopathy, well-dressed, normal affect, normal respiratory effort. Examination patient has progressive dry gangrenous changes plantar aspect of the great toe as well as increased ischemic changes to the lesser toes.  There is cellulitis advancing to his ankle.  Patient does not have palpable pulses his capillary refill is 4 seconds in the great toe.  Examination of the left hand he has good radial and ulnar pulses he has good range of motion no triggering of his fingers no ischemic changes in his hand no focal motor weakness possibly nerve irritation from his recent fall.heart RRR Lungs clear Assessment/Plan  1. Gangrene of right foot ([place])     Plan: With the cellulitis extending up to his ankle and advanced ischemic pain I recommended proceeding with a below the knee amputation on the right.  Patient is extremely motivated I feel that he should be able to proceed with inpatient rehab.  Due to the increased pain patient wished to proceed with surgery tomorrow.  Bevely Palmer Persons, PA 12/12/2020, 6:37 AM

## 2020-12-12 NOTE — Anesthesia Procedure Notes (Signed)
Procedure Name: LMA Insertion Date/Time: 12/12/2020 1:49 PM Performed by: Harden Mo, CRNA Pre-anesthesia Checklist: Patient identified, Emergency Drugs available, Suction available and Patient being monitored Patient Re-evaluated:Patient Re-evaluated prior to induction Oxygen Delivery Method: Circle System Utilized Preoxygenation: Pre-oxygenation with 100% oxygen Induction Type: IV induction Ventilation: Mask ventilation without difficulty LMA: LMA inserted LMA Size: 4.0 Number of attempts: 1 Airway Equipment and Method: Bite block Placement Confirmation: positive ETCO2 Tube secured with: Tape Dental Injury: Teeth and Oropharynx as per pre-operative assessment

## 2020-12-12 NOTE — Anesthesia Procedure Notes (Signed)
Anesthesia Regional Block: Popliteal block   Pre-Anesthetic Checklist: ,, timeout performed, Correct Patient, Correct Site, Correct Laterality, Correct Procedure, Correct Position, site marked, Risks and benefits discussed,  Surgical consent,  Pre-op evaluation,  At surgeon's request and post-op pain management  Laterality: Right  Prep: chloraprep       Needles:  Injection technique: Single-shot  Needle Type: Echogenic Needle     Needle Length: 9cm      Additional Needles:   Procedures:,,,, ultrasound used (permanent image in chart),,,,  Narrative:  Start time: 12/12/2020 1:18 PM End time: 12/12/2020 1:24 PM Injection made incrementally with aspirations every 5 mL.  Performed by: Personally  Anesthesiologist: Myrtie Soman, MD  Additional Notes: Patient tolerated the procedure well without complications

## 2020-12-12 NOTE — Transfer of Care (Signed)
Immediate Anesthesia Transfer of Care Note  Patient: Keith Espinoza  Procedure(s) Performed: RIGHT BELOW KNEE AMPUTATION (Right Knee)  Patient Location: PACU  Anesthesia Type:General and Regional  Level of Consciousness: awake, alert  and oriented  Airway & Oxygen Therapy: Patient Spontanous Breathing  Post-op Assessment: Report given to RN and Post -op Vital signs reviewed and stable  Post vital signs: Reviewed and stable  Last Vitals:  Vitals Value Taken Time  BP 134/65 12/12/20 1424  Temp    Pulse 107 12/12/20 1425  Resp 12 12/12/20 1425  SpO2 85 % 12/12/20 1425  Vitals shown include unvalidated device data.  Last Pain:  Vitals:   12/12/20 1051  TempSrc:   PainSc: 0-No pain      Patients Stated Pain Goal: 0 (91/22/58 3462)  Complications: No complications documented.

## 2020-12-12 NOTE — Anesthesia Preprocedure Evaluation (Signed)
Anesthesia Evaluation  Patient identified by MRN, date of birth, ID band Patient awake    Reviewed: Allergy & Precautions, NPO status , Patient's Chart, lab work & pertinent test results  Airway Mallampati: II  TM Distance: >3 FB Neck ROM: Full    Dental no notable dental hx.    Pulmonary neg pulmonary ROS, former smoker,    Pulmonary exam normal breath sounds clear to auscultation       Cardiovascular hypertension, + CAD, + Past MI, + Cardiac Stents and + Peripheral Vascular Disease  Normal cardiovascular exam Rhythm:Regular Rate:Normal     Neuro/Psych negative neurological ROS  negative psych ROS   GI/Hepatic negative GI ROS, Neg liver ROS,   Endo/Other  negative endocrine ROSdiabetes  Renal/GU negative Renal ROS  negative genitourinary   Musculoskeletal negative musculoskeletal ROS (+)   Abdominal   Peds negative pediatric ROS (+)  Hematology negative hematology ROS (+)   Anesthesia Other Findings   Reproductive/Obstetrics negative OB ROS                             Anesthesia Physical Anesthesia Plan  ASA: III  Anesthesia Plan: General   Post-op Pain Management:  Regional for Post-op pain   Induction: Intravenous  PONV Risk Score and Plan: 2 and Ondansetron, Dexamethasone and Treatment may vary due to age or medical condition  Airway Management Planned: LMA  Additional Equipment:   Intra-op Plan:   Post-operative Plan: Extubation in OR  Informed Consent: I have reviewed the patients History and Physical, chart, labs and discussed the procedure including the risks, benefits and alternatives for the proposed anesthesia with the patient or authorized representative who has indicated his/her understanding and acceptance.     Dental advisory given  Plan Discussed with: CRNA and Surgeon  Anesthesia Plan Comments:         Anesthesia Quick Evaluation

## 2020-12-12 NOTE — Interval H&P Note (Signed)
History and Physical Interval Note:  12/12/2020 11:26 AM  Keith Espinoza  has presented today for surgery, with the diagnosis of Gangrene Right Foot.  The various methods of treatment have been discussed with the patient and family. After consideration of risks, benefits and other options for treatment, the patient has consented to  Procedure(s): RIGHT BELOW KNEE AMPUTATION (Right) as a surgical intervention.  The patient's history has been reviewed, patient examined, no change in status, stable for surgery.  I have reviewed the patient's chart and labs.  Questions were answered to the patient's satisfaction.     [name]

## 2020-12-12 NOTE — Op Note (Signed)
   Date of Surgery: 12/12/2020  INDICATIONS: Mr. Torrance is a 80 y.o.-year-old male who has undergone limb salvage intervention with both revascularization procedures close monitoring and wound care with antibiotics.  Despite limb salvage intervention patient has progressive pain with ischemic changes and wished to proceed with a transtibial amputation.Marland Kitchen  PREOPERATIVE DIAGNOSIS: Peripheral vascular disease diabetes gangrene right foot  POSTOPERATIVE DIAGNOSIS: Same.  PROCEDURE: Transtibial amputation Application of Prevena wound VAC  SURGEON: Sharol Given, M.D.  ANESTHESIA:  general  IV FLUIDS AND URINE: See anesthesia records.  ESTIMATED BLOOD LOSS: See anesthesia records.  COMPLICATIONS: None.  DESCRIPTION OF PROCEDURE: The patient was brought to the operating room after undergoing regional anesthetic. After adequate levels of anesthesia were obtained patient's lower extremity was prepped using DuraPrep draped into a sterile field. A timeout was called. The foot was draped out of the sterile field with impervious stockinette. A transverse incision was made 11 cm distal to the tibial tubercle. This curved proximally and a large posterior flap was created. The tibia was transected 1 cm proximal to the skin incision. The fibula was transected just proximal to the tibial incision. The tibia was beveled anteriorly. A large posterior flap was created. The sciatic nerve was pulled cut and allowed to retract. The vascular bundles were suture ligated with 2-0 silk. The deep and superficial fascial layers were closed using #1 Vicryl. The skin was closed using staples and 2-0 nylon. The wound was covered with a Prevena customizable and arthroform wound VAC.  The dressing was sealed with dermatac there was a good suction fit. A prosthetic shrinker will be applied in patient's room. Patient was taken to the PACU in stable condition.   DISCHARGE PLANNING:  Antibiotic duration: 24 hours  Weightbearing:  Nonweightbearing on the operative extremity  Pain medication: Opioid pathway  Dressing care/ Wound VAC: Continue wound VAC for 1 week after discharge  Discharge to: Discharge planning based on therapy's recommendations for possible inpatient rehabilitation, outpatient rehabilitation, or discharge to home with therapy  Follow-up: In the office 1 week post operative.  Meridee Score, MD [place] 2:31 PM

## 2020-12-12 NOTE — Anesthesia Procedure Notes (Signed)
Anesthesia Procedure Image    

## 2020-12-12 NOTE — Anesthesia Postprocedure Evaluation (Signed)
Anesthesia Post Note  Patient: Keith Espinoza  Procedure(s) Performed: RIGHT BELOW KNEE AMPUTATION (Right Knee)     Patient location during evaluation: PACU Anesthesia Type: General Level of consciousness: awake and alert Pain management: pain level controlled Vital Signs Assessment: post-procedure vital signs reviewed and stable Respiratory status: spontaneous breathing, nonlabored ventilation, respiratory function stable and patient connected to nasal cannula oxygen Cardiovascular status: blood pressure returned to baseline and stable Postop Assessment: no apparent nausea or vomiting Anesthetic complications: no   No complications documented.  Last Vitals:  Vitals:   12/12/20 1509 12/12/20 1524  BP: 136/72 (!) 133/58  Pulse: 78 73  Resp: 16 13  Temp:    SpO2: 100% 95%    Last Pain:  Vitals:   12/12/20 1524  TempSrc:   PainSc: 0-No pain                 Revia Nghiem S

## 2020-12-13 ENCOUNTER — Encounter (HOSPITAL_COMMUNITY): Payer: Self-pay | Admitting: Orthopedic Surgery

## 2020-12-13 LAB — BASIC METABOLIC PANEL
Anion gap: 9 (ref 5–15)
BUN: 35 mg/dL — ABNORMAL HIGH (ref 8–23)
CO2: 28 mmol/L (ref 22–32)
Calcium: 9.4 mg/dL (ref 8.9–10.3)
Chloride: 95 mmol/L — ABNORMAL LOW (ref 98–111)
Creatinine, Ser: 1.33 mg/dL — ABNORMAL HIGH (ref 0.61–1.24)
GFR, Estimated: 54 mL/min — ABNORMAL LOW (ref >60)
Glucose, Bld: 477 mg/dL — ABNORMAL HIGH (ref 70–99)
Potassium: 5 mmol/L (ref 3.5–5.1)
Sodium: 132 mmol/L — ABNORMAL LOW (ref 135–145)

## 2020-12-13 LAB — GLUCOSE, CAPILLARY
Glucose-Capillary: 399 mg/dL — ABNORMAL HIGH (ref 70–99)
Glucose-Capillary: 347 mg/dL — ABNORMAL HIGH (ref 70–99)
Glucose-Capillary: 414 mg/dL — ABNORMAL HIGH (ref 70–99)
Glucose-Capillary: 249 mg/dL — ABNORMAL HIGH (ref 70–99)
Glucose-Capillary: 193 mg/dL — ABNORMAL HIGH (ref 70–99)
Glucose-Capillary: 237 mg/dL — ABNORMAL HIGH (ref 70–99)
Glucose-Capillary: 154 mg/dL — ABNORMAL HIGH (ref 70–99)

## 2020-12-13 MED ORDER — OXYCODONE HCL 5 MG PO TABS
5.0000 mg | ORAL_TABLET | ORAL | Status: DC | PRN
  Administered 2020-12-14: 5 mg via ORAL
  Administered 2020-12-14 – 2020-12-16 (×7): 10 mg via ORAL
  Administered 2020-12-16: 5 mg via ORAL
  Administered 2020-12-16: 10 mg via ORAL
  Filled 2020-12-13 (×2): qty 2
  Filled 2020-12-13: qty 1
  Filled 2020-12-13 (×2): qty 2
  Filled 2020-12-13: qty 1
  Filled 2020-12-13 (×4): qty 2

## 2020-12-13 MED ORDER — OXYCODONE HCL 5 MG PO TABS
5.0000 mg | ORAL_TABLET | ORAL | Status: DC | PRN
  Administered 2020-12-13: 5 mg via ORAL
  Filled 2020-12-13: qty 1

## 2020-12-13 NOTE — Progress Notes (Incomplete)
Inpatient Diabetes Program Recommendations  AACE/ADA: New Consensus Statement on Inpatient Glycemic Control (2015)  Target Ranges:  Prepandial:   less than 140 mg/dL      Peak postprandial:   less than 180 mg/dL (1-2 hours)      Critically ill patients:  140 - 180 mg/dL   Lab Results  Component Value Date   GLUCAP 249 (H) 12/13/2020   HGBA1C 7.7 (H) 12/12/2020    Review of Glycemic Control  Diabetes history: *** Outpatient Diabetes medications: *** Current orders for Inpatient glycemic control: ***  Inpatient Diabetes Program Recommendations:

## 2020-12-13 NOTE — Progress Notes (Signed)
Pt pain  uncontrolled despite of given pain meds and PRN. Dr. Marlou Sa MD on call made aware.

## 2020-12-13 NOTE — Evaluation (Signed)
Physical Therapy Evaluation Patient Details Name: Keith Espinoza MRN: [id number] DOB: 05-10-1941 Today's Date: 12/13/2020   History of Present Illness  Pt adm for elective TTA due to increasing ischemic pain and failed angioplasty of RLE.  PMH significant for heart dx, shoulder surgery x 2, R THR and revision, L TKR, back surgery x 3, DM, diabetic neuropathy mild.  Clinical Impression  Patient presents with dependencies in gait and transfers due to new TTA and decreased balance.  Anticipate pt will make steady gains and be able to d/c home.  May need CIR as patient is primary caregiver for wife and needs to be independent at d/c.      Follow Up Recommendations CIR    Equipment Recommendations  None recommended by PT    Recommendations for Other Services       Precautions / Restrictions Precautions Precautions: Fall Required Braces or Orthoses: Other Brace Other Brace: stump shrinker/shaper RLE Restrictions RLE Weight Bearing: Non weight bearing      Mobility  Bed Mobility Overal bed mobility: Modified Independent             General bed mobility comments: used railing to get to EOB    Transfers Overall transfer level: Needs assistance Equipment used: Rolling walker (2 wheeled) Transfers: Sit to/from Stand Sit to Stand: Min assist         General transfer comment: assist to power up  Ambulation/Gait Ambulation/Gait assistance: Min assist Gait Distance (Feet): 30 Feet Assistive device: Rolling walker (2 wheeled)   Gait velocity: decreased   General Gait Details: assist for balance  Stairs            Wheelchair Mobility    Modified Rankin (Stroke Patients Only)       Balance Overall balance assessment: Needs assistance Sitting-balance support: No upper extremity supported;Feet supported Sitting balance-Leahy Scale: Good     Standing balance support: Bilateral upper extremity supported;During functional activity Standing balance-Leahy  Scale: Poor Standing balance comment: reliant on RW due to TTA                             Pertinent Vitals/Pain Pain Assessment: No/denies pain    Home Living Family/patient expects to be discharged to:: Private residence Living Arrangements: Spouse/significant other (pt cares for spouse) Available Help at Discharge: Family;Friend(s);Available PRN/intermittently Type of Home: House Home Access: Level entry     Home Layout: One level (1 step up to bathroom) Home Equipment: Walker - 2 wheels;Cane - single point;Bedside commode;Shower seat Additional Comments: wife unable to physically assist; pt assists wife as needed, drives her to HD.    Prior Function Level of Independence: Independent               Hand Dominance        Extremity/Trunk Assessment   Upper Extremity Assessment Upper Extremity Assessment: Overall WFL for tasks assessed    Lower Extremity Assessment Lower Extremity Assessment: Overall WFL for tasks assessed       Communication   Communication: No difficulties  Cognition Arousal/Alertness: Awake/alert Behavior During Therapy: WFL for tasks assessed/performed Overall Cognitive Status: Within Functional Limits for tasks assessed                                        General Comments      Exercises Amputee Exercises Quad Sets: AROM;Right;10  reps;Seated Gluteal Sets: AROM;Both;10 reps;Seated Hip ABduction/ADduction: AROM;Right;10 reps;Seated Straight Leg Raises: AROM;Right;10 reps;Seated   Assessment/Plan    PT Assessment Patient needs continued PT services  PT Problem List Decreased activity tolerance;Decreased balance;Decreased mobility;Decreased knowledge of use of DME       PT Treatment Interventions DME instruction;Gait training;Stair training;Functional mobility training;Therapeutic activities;Therapeutic exercise;Balance training;Patient/family education    PT Goals (Current goals can be found in the  Care Plan section)  Acute Rehab PT Goals Patient Stated Goal: get back home PT Goal Formulation: With patient/family Time For Goal Achievement: 12/20/20 Potential to Achieve Goals: Good    Frequency Min 5X/week   Barriers to discharge Decreased caregiver support      Co-evaluation               AM-PAC PT "6 Clicks" Mobility  Outcome Measure Help needed turning from your back to your side while in a flat bed without using bedrails?: None Help needed moving from lying on your back to sitting on the side of a flat bed without using bedrails?: None Help needed moving to and from a bed to a chair (including a wheelchair)?: A Little Help needed standing up from a chair using your arms (e.g., wheelchair or bedside chair)?: A Little Help needed to walk in hospital room?: A Little Help needed climbing 3-5 steps with a railing? : A Lot 6 Click Score: 19    End of Session Equipment Utilized During Treatment: Gait belt Activity Tolerance: Patient tolerated treatment well Patient left: in chair;with call bell/phone within reach;with chair alarm set;with family/visitor present   PT Visit Diagnosis: Unsteadiness on feet (R26.81);Other abnormalities of gait and mobility (R26.89)    Time: 5176-1607 PT Time Calculation (min) (ACUTE ONLY): 27 min   Charges:   PT Evaluation $PT Eval Moderate Complexity: 1 Mod PT Treatments $Therapeutic Exercise: 8-22 mins        12/13/2020 Margie, PT Acute Rehabilitation Services Pager:  [phone] Office:  [phone]    Shanna Cisco 12/13/2020, 2:27 PM

## 2020-12-13 NOTE — Progress Notes (Signed)
Pt noted lab results with glucose 477. Rechecked CBG--399. Paged on call Raeford Razor. @ [phone] Second page. @0310  [facility] called back with no new order.

## 2020-12-14 LAB — GLUCOSE, CAPILLARY
Glucose-Capillary: 174 mg/dL — ABNORMAL HIGH (ref 70–99)
Glucose-Capillary: 147 mg/dL — ABNORMAL HIGH (ref 70–99)
Glucose-Capillary: 217 mg/dL — ABNORMAL HIGH (ref 70–99)
Glucose-Capillary: 203 mg/dL — ABNORMAL HIGH (ref 70–99)

## 2020-12-14 MED ORDER — KETOROLAC TROMETHAMINE 15 MG/ML IJ SOLN
15.0000 mg | Freq: Four times a day (QID) | INTRAMUSCULAR | Status: DC | PRN
  Administered 2020-12-14: 15 mg via INTRAVENOUS
  Filled 2020-12-14: qty 1

## 2020-12-14 MED ORDER — KETOROLAC TROMETHAMINE 15 MG/ML IJ SOLN: 15.0000 mg | Freq: Four times a day (QID) | INTRAMUSCULAR | Status: DC

## 2020-12-14 NOTE — Progress Notes (Signed)
  Subjective: Patient stable but.  He is having a lot of pain.  Pain medicine has been adjusted and optimized.   Objective: Vital signs in last 24 hours: Temp:  [97.5 F (36.4 C)-98.3 F (36.8 C)] 97.5 F (36.4 C) (03/13 0448) Pulse Rate:  [67-76] 72 (03/13 0448) Resp:  [16-18] 18 (03/13 0448) BP: (113-181)/(79-91) 158/79 (03/13 0448) SpO2:  [98 %-100 %] 98 % (03/13 0448)  Intake/Output from previous day: 03/12 0701 - 03/13 0700 In: 540 [P.O.:540] Out: 1075 [Urine:1075] Intake/Output this shift: Total I/O In: 300 [P.O.:300] Out: -   Exam:  Wound VAC is functional.  Compartments are soft.  No cellulitis in that right lower extremity BKA stump  Labs: Recent Labs    12/12/20 1141  HGB 13.4   Recent Labs    12/12/20 1141  WBC 10.4  RBC 4.31  HCT 39.1  PLT 190   Recent Labs    12/12/20 1141 12/13/20 0119  NA 136 132*  K 5.0 5.0  CL 99 95*  CO2 18* 28  BUN 31* 35*  CREATININE 1.41* 1.33*  GLUCOSE 250* 477*  CALCIUM 9.7 9.4   No results for input(s): LABPT, INR in the last 72 hours.  Assessment/Plan: Plan at this time is continue with current wound VAC.  I do not think there is too much else that we can do pain medicine wise.  We did take him out of the brace that he was then which was marginally helpful.   Keith Espinoza Keith Espinoza 12/14/2020, 12:14 PM

## 2020-12-14 NOTE — Evaluation (Signed)
Occupational Therapy Evaluation Patient Details Name: Keith Espinoza MRN: [id number] DOB: December 09, 1940 Today's Date: 12/14/2020    History of Present Illness Pt adm for elective TTA due to increasing ischemic pain and failed angioplasty of RLE.  PMH significant for heart dx, shoulder surgery x 2, R THR and revision, L TKR, back surgery x 3, DM, diabetic neuropathy mild.   Clinical Impression   Pt PTA: Pt living at home with spouse independently. Pt has a daughter in the area to assist. Pt currently, set-upA to modA overall for ADL; transfers with minA +2 for stability and hopping  <100' with RW and cues to clear floor with each step. Pt requiring minA in standing for stability at sink for grooming. Pt requires cues for safety.  Pt would benefit from continued OT skilled services. OT following acutely.    Follow Up Recommendations  CIR    Equipment Recommendations  3 in 1 bedside commode    Recommendations for Other Services Rehab consult     Precautions / Restrictions Precautions Precautions: Fall;Other (comment) Precaution Comments: wound vac Restrictions Weight Bearing Restrictions: Yes RLE Weight Bearing: Non weight bearing      Mobility Bed Mobility Overal bed mobility: Needs Assistance Bed Mobility: Supine to Sit     Supine to sit: Supervision          Transfers Overall transfer level: Needs assistance Equipment used: Rolling walker (2 wheeled) Transfers: Sit to/from Stand Sit to Stand: Min assist;+2 safety/equipment         General transfer comment: MinA to rise and steady from edge of bed    Balance Overall balance assessment: Needs assistance Sitting-balance support: No upper extremity supported;Feet supported Sitting balance-Leahy Scale: Good     Standing balance support: During functional activity;No upper extremity supported Standing balance-Leahy Scale: Poor Standing balance comment: MinA for balance when washing hands at sink                            ADL either performed or assessed with clinical judgement   ADL Overall ADL's : Needs assistance/impaired Eating/Feeding: Set up;Sitting   Grooming: Minimal assistance;Standing Grooming Details (indicate cue type and reason): minA for balance in standing Upper Body Bathing: Set up;Sitting   Lower Body Bathing: Moderate assistance;Sitting/lateral leans;Sit to/from stand;Cueing for safety   Upper Body Dressing : Set up;Sitting   Lower Body Dressing: Moderate assistance;Sit to/from stand;Cueing for safety;Sitting/lateral leans   Toilet Transfer: Minimal assistance;Ambulation;BSC;Grab bars;RW Armed forces technical officer Details (indicate cue type and reason): cues for hand placement Toileting- Clothing Manipulation and Hygiene: Moderate assistance;Cueing for safety;Sitting/lateral lean;Sit to/from stand       Functional mobility during ADLs: Min guard;Rolling walker;Cueing for sequencing;Cueing for safety General ADL Comments: Pt limited by severe pain in RLE, decreased strength, decreased caregiver assist at home and decreased safe mobility. Pt able to hop in room and in hallway  <100' with RW. Pt requiring cues to pick LLE up off of ground rather than sliding it when hopping. Pt education for fall risk prevention strategies     Vision Baseline Vision/History: Wears glasses Wears Glasses: At all times Patient Visual Report: No change from baseline Vision Assessment?: No apparent visual deficits     Perception     Praxis      Pertinent Vitals/Pain Pain Assessment: Faces Faces Pain Scale: Hurts whole lot Pain Location: surgical site Pain Descriptors / Indicators: Throbbing;Aching Pain Intervention(s): Monitored during session;Premedicated before session;Repositioned     Hand  Dominance Right   Extremity/Trunk Assessment Upper Extremity Assessment Upper Extremity Assessment: Overall WFL for tasks assessed   Lower Extremity Assessment Lower Extremity Assessment:  Overall WFL for tasks assessed;RLE deficits/detail RLE Deficits / Details: s/p BKA   Cervical / Trunk Assessment Cervical / Trunk Assessment: Normal   Communication Communication Communication: No difficulties   Cognition Arousal/Alertness: Awake/alert Behavior During Therapy: WFL for tasks assessed/performed Overall Cognitive Status: Within Functional Limits for tasks assessed                                     General Comments  Education for amputeee desensitization techniques    Exercises Exercises: Amputee Amputee Exercises Hip Flexion/Marching: Both;Seated;20 reps Knee Extension: Right;Other reps (comment);Seated (30 reps)   Shoulder Instructions      Home Living Family/patient expects to be discharged to:: Private residence Living Arrangements: Spouse/significant other Available Help at Discharge: Family;Friend(s);Available PRN/intermittently Type of Home: House Home Access: Level entry     Home Layout: One level     Bathroom Shower/Tub: Occupational psychologist: Standard Bathroom Accessibility: Yes   Home Equipment: Environmental consultant - 2 wheels;Cane - single point;Bedside commode;Shower seat   Additional Comments: wife unable to physically assist; pt assists wife as needed, drives her to HD.      Prior Functioning/Environment Level of Independence: Independent                 OT Problem List: Decreased strength;Decreased activity tolerance;Impaired balance (sitting and/or standing);Decreased safety awareness;Pain;Decreased knowledge of use of DME or AE;Increased edema;Decreased range of motion      OT Treatment/Interventions: Self-care/ADL training;Therapeutic exercise;Neuromuscular education;Energy conservation;DME and/or AE instruction;Therapeutic activities;Cognitive remediation/compensation;Patient/family education;Balance training    OT Goals(Current goals can be found in the care plan section) Acute Rehab OT Goals Patient Stated  Goal: get back home OT Goal Formulation: With patient Time For Goal Achievement: 12/28/20 Potential to Achieve Goals: Good ADL Goals Pt Will Perform Toileting - Clothing Manipulation and hygiene: with supervision;sitting/lateral leans;sit to/from stand Additional ADL Goal #1: Pt will perform x6 mins of OOB ADL with minguardA overall and no LOB episodes. Additional ADL Goal #2: Pt will perform ADL functional transfers with supervisionA and no cues for proper hand placement in prep for ADL.  OT Frequency: Min 2X/week   Barriers to D/C: Decreased caregiver support  spouse in hospital       Co-evaluation              AM-PAC OT "6 Clicks" Daily Activity     Outcome Measure Help from another person eating meals?: None Help from another person taking care of personal grooming?: A Little Help from another person toileting, which includes using toliet, bedpan, or urinal?: A Lot Help from another person bathing (including washing, rinsing, drying)?: A Lot Help from another person to put on and taking off regular upper body clothing?: A Little Help from another person to put on and taking off regular lower body clothing?: A Lot 6 Click Score: 16   End of Session Equipment Utilized During Treatment: Gait belt;Rolling walker Nurse Communication: Mobility status  Activity Tolerance: Patient tolerated treatment well;Patient limited by pain Patient left: in chair;with call bell/phone within reach;with chair alarm set;Other (comment) (working with PT in room)  OT Visit Diagnosis: Unsteadiness on feet (R26.81);Muscle weakness (generalized) (M62.81);Pain                Time: 4098-1191 OT  Time Calculation (min): 14 min Charges:  OT General Charges $OT Visit: 1 Visit OT Evaluation $OT Eval Moderate Complexity: 1 Mod  Jefferey Pica, OTR/L Acute Rehabilitation Services Pager: [phone] Office: [phone]    Carnisha Feltz C 12/14/2020, 2:48 PM

## 2020-12-14 NOTE — Progress Notes (Addendum)
Inpatient Rehab Admissions Coordinator Note:   Per PT/OT recommendations, pt was screened for CIR candidacy by Gayland Curry, MS, CCC-SLP.  At this time we are recommending an inpatient rehab consult.  If pt would like to be considered, please place an IP Rehab MD consult order.  Please contact me with questions.    Gayland Curry, [place], [place] Admissions Coordinator [phone] 12/14/20 4:48 PM

## 2020-12-14 NOTE — Progress Notes (Signed)
Physical Therapy Treatment Patient Details Name: Keith Espinoza MRN: [id number] DOB: September 05, 1941 Today's Date: 12/14/2020    History of Present Illness Pt adm for elective TTA due to increasing ischemic pain and failed angioplasty of RLE.  PMH significant for heart dx, shoulder surgery x 2, R THR and revision, L TKR, back surgery x 3, DM, diabetic neuropathy mild.    PT Comments    Pt reporting uncontrolled, surgical site pain since yesterday with placement of wound vac, however, still agreeable to therapy session and progressing well towards his goals. Requiring min assist for functional mobility. Hopping x 60 feet with a walker. Education reinforced regarding desensitization techniques, fall risk, limb positioning, amputee exercises. 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    Follow Up Recommendations  CIR     Equipment Recommendations  Wheelchair (measurements PT);Wheelchair cushion (measurements PT) (right swing away amputee legrest, anti tippers)    Recommendations for Other Services       Precautions / Restrictions Precautions Precautions: Fall;Other (comment) Precaution Comments: wound vac Restrictions Weight Bearing Restrictions: Yes RLE Weight Bearing: Non weight bearing    Mobility  Bed Mobility Overal bed mobility: Needs Assistance Bed Mobility: Supine to Sit     Supine to sit: Supervision          Transfers Overall transfer level: Needs assistance Equipment used: Rolling walker (2 wheeled) Transfers: Sit to/from Stand Sit to Stand: Min assist;+2 safety/equipment         General transfer comment: MinA to rise and steady from edge of bed  Ambulation/Gait Ambulation/Gait assistance: Min assist Gait Distance (Feet): 60 Feet Assistive device: Rolling walker (2 wheeled) Gait Pattern/deviations: Step-to pattern Gait velocity: decreased   General Gait Details: Hop to pattern, cues for rolling walker rather  than picking it up, increased left foot clearance   Stairs             Wheelchair Mobility    Modified Rankin (Stroke Patients Only)       Balance Overall balance assessment: Needs assistance Sitting-balance support: No upper extremity supported;Feet supported Sitting balance-Leahy Scale: Good     Standing balance support: During functional activity;No upper extremity supported Standing balance-Leahy Scale: Poor Standing balance comment: MinA for balance when washing hands at sink                            Cognition Arousal/Alertness: Awake/alert Behavior During Therapy: WFL for tasks assessed/performed Overall Cognitive Status: Within Functional Limits for tasks assessed                                        Exercises Amputee Exercises Hip Flexion/Marching: Both;Seated;20 reps Knee Extension: Right;Other reps (comment);Seated (30 reps)    General Comments        Pertinent Vitals/Pain Pain Assessment: Faces Faces Pain Scale: Hurts whole lot Pain Location: surgical site Pain Descriptors / Indicators: Throbbing;Aching Pain Intervention(s): Limited activity within patient's tolerance;Monitored during session    Home Living                      Prior Function            PT Goals (current goals can now be found in the care plan section) Acute Rehab PT Goals Patient Stated Goal: get back home Potential to  Achieve Goals: Good Progress towards PT goals: Progressing toward goals    Frequency    Min 3X/week      PT Plan Frequency needs to be updated    Co-evaluation              AM-PAC PT "6 Clicks" Mobility   Outcome Measure  Help needed turning from your back to your side while in a flat bed without using bedrails?: None Help needed moving from lying on your back to sitting on the side of a flat bed without using bedrails?: None Help needed moving to and from a bed to a chair (including a  wheelchair)?: A Little Help needed standing up from a chair using your arms (e.g., wheelchair or bedside chair)?: A Little Help needed to walk in hospital room?: A Little Help needed climbing 3-5 steps with a railing? : A Lot 6 Click Score: 19    End of Session Equipment Utilized During Treatment: Gait belt Activity Tolerance: Patient tolerated treatment well Patient left: in chair;with call bell/phone within reach;with chair alarm set Nurse Communication: Mobility status PT Visit Diagnosis: Unsteadiness on feet (R26.81);Other abnormalities of gait and mobility (R26.89)     Time: 9983-3825 PT Time Calculation (min) (ACUTE ONLY): 20 min  Charges:  $Gait Training: 8-22 mins                     Keith Espinoza, PT, DPT Acute Rehabilitation Services Pager [phone] Office [phone]    Keith Espinoza 12/14/2020, 11:28 AM

## 2020-12-14 NOTE — Progress Notes (Signed)
Chaplain responded to Code [facility] Keith Espinoza wife. Physician informed Chaplain that patient's husband was upstairs and daughter had just gone up to his room. Chaplain visited with Keith Espinoza and his daughter and explained that Keith Espinoza was being taken to [place] lab and then transferred to [facility]. [name] was sitting up on the side of the bed eating his lunch and seemed to be in good spirits but spoke about the pain he was having and medication he was being given was not helping. After visiting with them for a while, Chaplain left to go check on [name]. Chaplain is available when needed.    12/14/20 1200  Clinical Encounter Type  Visited With Patient and family together  Visit Type Spiritual support;Trauma  Referral From Physician  Consult/Referral To Chaplain  Spiritual Encounters  Spiritual Needs Prayer;Emotional  Stress Factors  Patient Stress Factors Major life changes  Family Stress Factors Health changes

## 2020-12-15 ENCOUNTER — Ambulatory Visit: Payer: Medicare Other | Admitting: Orthopedic Surgery

## 2020-12-15 ENCOUNTER — Other Ambulatory Visit: Payer: Self-pay | Admitting: *Deleted

## 2020-12-15 ENCOUNTER — Telehealth: Payer: Self-pay

## 2020-12-15 LAB — GLUCOSE, CAPILLARY
Glucose-Capillary: 239 mg/dL — ABNORMAL HIGH (ref 70–99)
Glucose-Capillary: 221 mg/dL — ABNORMAL HIGH (ref 70–99)
Glucose-Capillary: 113 mg/dL — ABNORMAL HIGH (ref 70–99)
Glucose-Capillary: 273 mg/dL — ABNORMAL HIGH (ref 70–99)
Glucose-Capillary: 157 mg/dL — ABNORMAL HIGH (ref 70–99)

## 2020-12-15 LAB — SURGICAL PATHOLOGY

## 2020-12-15 NOTE — Progress Notes (Signed)
Inpatient Rehab Admissions Coordinator:   Met with patient at bedside, and daughters outside the room.  They note they are interested in rehab, but pts wife is currently in the ICU and received a poor prognosis today, so they asked me to return tomorrow, which I can do.   Shann Medal, PT, DPT Admissions Coordinator [phone] 12/15/20  12:59 PM

## 2020-12-15 NOTE — H&P (Shared)
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 He also reported recent fall as well as numbness in left hand felt to be due to nerve injury.  He was admitted on 12/12/20 for L-BKA by Dr. Sharol Given. Post op had issues with pain control, acute renal failure with rise in SCr to 1.4, hyponatremia as well as elevated BS up to 400's. Therapy initiated and patient noted to have limitations in mobility due to R-BKA, pain and weakness affecting ADLs and mobility. CIR recommended due to functional decline.    ROS    Past Medical History:  Diagnosis Date  . Allergic rhinitis   . Allergic rhinitis   . Arthritis   . Basal cell carcinoma 11/01/2019    bcc left chest treatment TX cx3 68fu   . Chronic leg pain    right  . Chronic lower back pain   . Coronary artery disease    a. Stenting to RCA 2004; staged DES to LAD and Cx 2004. DES to mRCA 2012. b. DES to mCx, PTCA to dCx 11/2011. c. Lateral wall MI 2013 s/p PTCA to distal Cx & DES to mid OM2 11/2011. d. Low risk nuc 04/2014, EF wnl.  . Diabetes mellitus    Insulin dependent  . Diabetic neuropathy (HCC)    MILD  . Diverticulosis   . Dysrhythmia   . Gilbert syndrome   . Gout    right wrist; right foot; right elbow; have had it since 1970's  . H/O hiatal hernia   . Heart murmur   . History of echocardiogram    aortic sclerosis per echo 12/09 EF 65%, otherwise normal  . History of hemorrhoids    BLEEDING  . History of kidney stones    h/o  . Hypertension    Diagnosed 1995   . Myocardial infarction ([place])   . Pancreatic pseudocyst    a. s/p remote drainage 2006.  Marland Kitchen Thrombocytopenia ([place])     Seen on oldest labs in system from 2004  . Vitamin B 12 deficiency    orally replaced   Past Surgical History:  Procedure Laterality Date  . ABDOMINAL AORTOGRAM W/LOWER EXTREMITY Bilateral 08/08/2020   Procedure: ABDOMINAL AORTOGRAM W/LOWER EXTREMITY;  Surgeon: Angelia Mould, MD;  Location: [place] [facility];  Service: Cardiovascular;  Laterality: Bilateral;  . AMPUTATION Right 12/12/2020   Procedure: RIGHT BELOW KNEE AMPUTATION;  Surgeon: [name], MD;  Location: [place];  Service: Orthopedics;  Laterality: Right;  . BACK SURGERY     "total of 3 times" S/P fall   . CARPAL TUNNEL RELEASE Bilateral   . CHOLECYSTECTOMY  1990's  . COLONOSCOPY    . CORONARY ANGIOPLASTY  11/11/11  . CORONARY ANGIOPLASTY WITH STENT PLACEMENT  09/30/2011   "1 then; makes a total of 4"  . CORONARY ANGIOPLASTY WITH STENT PLACEMENT  11/11/11   "1; makes a total of 5"  . INGUINAL HERNIA REPAIR  2003   right  . JOINT REPLACEMENT Right 04/03/2002   hip replacment  . KNEE ARTHROSCOPY  1990's   left  . LEFT HEART CATHETERIZATION WITH CORONARY ANGIOGRAM N/A 09/30/2011   Procedure: LEFT HEART  CATHETERIZATION WITH CORONARY ANGIOGRAM;  Surgeon: Jettie Booze, MD;  Location: [facility] C[facility] LAB;  Service: Cardiovascular;  Laterality: N/A;  possible PCI  . LEFT HEART CATHETERIZATION WITH CORONARY ANGIOGRAM N/A 11/15/2011   Procedure: LEFT HEART CATHETERIZATION WITH CORONARY ANGIOGRAM;  Surgeon: Jettie Booze, MD;  Location: [facility] CATH LAB;  Service: Cardiovascular;  Laterality: N/A;  . PERCUTANEOUS CORONARY STENT INTERVENTION (PCI-S)  09/30/2011   Procedure: PERCUTANEOUS CORONARY STENT INTERVENTION (PCI-S);  Surgeon: Jettie Booze, MD;  Location: [facility] CATH LAB;  Service: Cardiovascular;;  . PERCUTANEOUS CORONARY STENT INTERVENTION (PCI-S) N/A 11/11/2011   Procedure: PERCUTANEOUS CORONARY STENT INTERVENTION (PCI-S);  Surgeon: Jettie Booze, MD;  Location: [facility] CATH LAB;  Service: Cardiovascular;   Laterality: N/A;  . PERIPHERAL VASCULAR BALLOON ANGIOPLASTY Right 08/08/2020   Procedure: PERIPHERAL VASCULAR BALLOON ANGIOPLASTY;  Surgeon: Angelia Mould, MD;  Location: [place] [facility];  Service: Cardiovascular;  Laterality: Right;  Posterior tibial   . SHOULDER SURGERY Right    X 2  . TONSILLECTOMY  ~ 1948  . TOTAL HIP REVISION Right 04/13/2019   Procedure: RIGHT TOTAL HIP REVISION-POSTERIOR  APPROACH LATERAL;  Surgeon: Marybelle Killings, MD;  Location: [place];  Service: Orthopedics;  Laterality: Right;  . TOTAL KNEE ARTHROPLASTY Left 07/23/2016   Procedure: LEFT TOTAL KNEE ARTHROPLASTY;  Surgeon: Marybelle Killings, MD;  Location: [place];  Service: Orthopedics;  Laterality: Left;    Family History  Problem Relation Age of Onset  . Diabetes Mother   . Hyperlipidemia Mother   . Hypertension Mother   . Cancer Father   . Hypertension Father   . Diabetes Sister   . Hypertension Sister   . Cancer Brother   . Heart attack Neg Hx     Social History:  Married--he used a cane PTA. Wife in ICU currently. Raises Hay and has an acre of garden in the summer. He used to smoke cigarettes --about a pack/week. He reports that he has quit smoking. His smokeless tobacco use includes chew all the time till 13 years ago--now a pack last 2 days.  He reports that he does not drink alcohol and does not use drugs.    Allergies  Allergen Reactions  . Simvastatin Other (See Comments)    SEVERE MYALGIAS   . Zetia [Ezetimibe] Other (See Comments)    MYALGIAS  . Dilaudid [Hydromorphone Hcl] Other (See Comments)    hallucination    Medications Prior to Admission  Medication Sig Dispense Refill  . allopurinol (ZYLOPRIM) 300 MG tablet Take 300 mg by mouth daily.    Marland Kitchen amLODipine (NORVASC) 5 MG tablet Take 5 mg by mouth daily.    Marland Kitchen aspirin EC 81 MG tablet Take 81 mg by mouth daily.    . Cholecalciferol (VITAMIN D3) 50 MCG (2000 UT) TABS Take 2,000 Units by mouth daily.     . clopidogrel (PLAVIX) 75 MG  tablet TAKE ONE TABLET BY MOUTH ONE TIME DAILY (Patient taking differently: Take 75 mg by mouth daily.) 30 tablet 5  . Cyanocobalamin (B-12) 2500 MCG TABS Take 2,500 mcg by mouth daily.    . empagliflozin (JARDIANCE) 25 MG TABS tablet Take 25 mg by mouth daily.    Marland Kitchen gabapentin (NEURONTIN) 300 MG capsule Take 300 mg by mouth 2 (two) times daily.  3  . hydrochlorothiazide (HYDRODIURIL) 12.5 MG tablet Take 12.5 mg by mouth daily.    . insulin detemir (LEVEMIR) 100 UNIT/ML injection Inject 12-20 Units into the skin daily as needed (  blood sugar above 150).    . isosorbide mononitrate (IMDUR) 30 MG 24 hr tablet Take 1 tablet (30 mg total) by mouth daily. Please schedule yearly appointment for future refills. Thank you 90 tablet 0  . KLOR-CON M20 20 MEQ tablet Take 20 mEq by mouth daily.   3  . losartan (COZAAR) 50 MG tablet Take 50 mg by mouth daily.  2  . metFORMIN (GLUCOPHAGE) 1000 MG tablet Take 1 tablet (1,000 mg total) by mouth 2 (two) times daily.    Marland Kitchen terazosin (HYTRIN) 5 MG capsule Take 5 mg by mouth at bedtime.    . nitroGLYCERIN (NITROSTAT) 0.4 MG SL tablet Place 0.4 mg under the tongue every 5 (five) minutes x 3 doses as needed for chest pain.     . ONE TOUCH ULTRA TEST test strip CHECK BLOOD SUGAR ONCE DAILY AS DIRECTED  5    Drug Regimen Review { DRUG REGIMEN [id number]}  Home: Home Living Family/patient expects to be discharged to:: Private residence Living Arrangements: Spouse/significant other Available Help at Discharge: Family,Friend(s),Available PRN/intermittently Type of Home: House Home Access: Level entry Home Layout: One level Bathroom Shower/Tub: Multimedia programmer: Standard Bathroom Accessibility: Yes Home Equipment: Environmental consultant - 2 wheels,Cane - single point,Bedside commode,Shower seat Additional Comments: wife unable to physically assist; pt assists wife as needed, drives her to HD.   Functional History: Prior Function Level of Independence:  Independent  Functional Status:  Mobility: Bed Mobility Overal bed mobility: Needs Assistance Bed Mobility: Supine to Sit Supine to sit: Supervision General bed mobility comments: used railing to get to EOB Transfers Overall transfer level: Needs assistance Equipment used: Rolling walker (2 wheeled) Transfers: Sit to/from Stand Sit to Stand: Min assist,+2 safety/equipment General transfer comment: MinA to rise and steady from edge of bed Ambulation/Gait Ambulation/Gait assistance: Min assist Gait Distance (Feet): 60 Feet Assistive device: Rolling walker (2 wheeled) Gait Pattern/deviations: Step-to pattern General Gait Details: Hop to pattern, cues for rolling walker rather than picking it up, increased left foot clearance Gait velocity: decreased    ADL: ADL Overall ADL's : Needs assistance/impaired Eating/Feeding: Set up,Sitting Grooming: Minimal assistance,Standing Grooming Details (indicate cue type and reason): minA for balance in standing Upper Body Bathing: Set up,Sitting Lower Body Bathing: Moderate assistance,Sitting/lateral leans,Sit to/from stand,Cueing for safety Upper Body Dressing : Set up,Sitting Lower Body Dressing: Moderate assistance,Sit to/from stand,Cueing for safety,Sitting/lateral leans Toilet Transfer: Minimal assistance,Ambulation,BSC,Grab bars,RW Toilet Transfer Details (indicate cue type and reason): cues for hand placement Toileting- Clothing Manipulation and Hygiene: Moderate assistance,Cueing for safety,Sitting/lateral lean,Sit to/from stand Functional mobility during ADLs: Min guard,Rolling walker,Cueing for sequencing,Cueing for safety General ADL Comments: Pt limited by severe pain in RLE, decreased strength, decreased caregiver assist at home and decreased safe mobility. Pt able to hop in room and in hallway  <100' with RW. Pt requiring cues to pick LLE up off of ground rather than sliding it when hopping. Pt education for fall risk prevention  strategies  Cognition: Cognition Overall Cognitive Status: Within Functional Limits for tasks assessed Orientation Level: Oriented X4 Cognition Arousal/Alertness: Awake/alert Behavior During Therapy: WFL for tasks assessed/performed Overall Cognitive Status: Within Functional Limits for tasks assessed   Blood pressure (!) 148/79, pulse 79, temperature (!) 97.1 F (36.2 C), temperature source Oral, resp. rate 17, height 5\' 10"  (1.778 m), weight 72.6 kg, SpO2 99 %. Physical Exam Vitals and nursing note reviewed.  Constitutional:      Appearance: Normal appearance.  Pulmonary:     Effort: Pulmonary effort is  normal.     Breath sounds: Normal breath sounds.  Musculoskeletal:     Comments: R-BKA with wound VAC in place. LLE without edema or breakdown.   Neurological:     Mental Status: He is alert and oriented to person, place, and time.     Results for orders placed or performed during the hospital encounter of 12/12/20 (from the past 48 hour(s))  Glucose, capillary     Status: Abnormal   Collection Time: 12/13/20 12:08 PM  Result Value Ref Range   Glucose-Capillary 193 (H) 70 - 99 mg/dL    Comment: Glucose reference range applies only to samples taken after fasting for at least 8 hours.  Glucose, capillary     Status: Abnormal   Collection Time: 12/13/20  5:16 PM  Result Value Ref Range   Glucose-Capillary 237 (H) 70 - 99 mg/dL    Comment: Glucose reference range applies only to samples taken after fasting for at least 8 hours.  Glucose, capillary     Status: Abnormal   Collection Time: 12/13/20  8:35 PM  Result Value Ref Range   Glucose-Capillary 154 (H) 70 - 99 mg/dL    Comment: Glucose reference range applies only to samples taken after fasting for at least 8 hours.  Glucose, capillary     Status: Abnormal   Collection Time: 12/14/20  7:53 AM  Result Value Ref Range   Glucose-Capillary 174 (H) 70 - 99 mg/dL    Comment: Glucose reference range applies only to samples  taken after fasting for at least 8 hours.  Glucose, capillary     Status: Abnormal   Collection Time: 12/14/20 12:14 PM  Result Value Ref Range   Glucose-Capillary 147 (H) 70 - 99 mg/dL    Comment: Glucose reference range applies only to samples taken after fasting for at least 8 hours.  Glucose, capillary     Status: Abnormal   Collection Time: 12/14/20  4:57 PM  Result Value Ref Range   Glucose-Capillary 217 (H) 70 - 99 mg/dL    Comment: Glucose reference range applies only to samples taken after fasting for at least 8 hours.  Glucose, capillary     Status: Abnormal   Collection Time: 12/14/20  9:39 PM  Result Value Ref Range   Glucose-Capillary 203 (H) 70 - 99 mg/dL    Comment: Glucose reference range applies only to samples taken after fasting for at least 8 hours.  Glucose, capillary     Status: Abnormal   Collection Time: 12/15/20  7:48 AM  Result Value Ref Range   Glucose-Capillary 239 (H) 70 - 99 mg/dL    Comment: Glucose reference range applies only to samples taken after fasting for at least 8 hours.  Glucose, capillary     Status: Abnormal   Collection Time: 12/15/20 12:22 PM  Result Value Ref Range   Glucose-Capillary 221 (H) 70 - 99 mg/dL    Comment: Glucose reference range applies only to samples taken after fasting for at least 8 hours.   No results found.     Medical Problem List and Plan: 1.  *** secondary to ***  -patient may *** shower  -ELOS/Goals: *** 2.  Antithrombotics: -DVT/anticoagulation:  Pharmaceutical: Lovenox  -antiplatelet therapy: ASA/Plavix 3. Pain Management:  Will increase gabapentin to tid to help with phantom pain  --continue  Oxycodone prn. Will add OxyContin bid for more consistent relief--not getting pain meds on time.  4. Mood: LCSW to follow for evaluation and support.   -antipsychotic  agents: N/A 5. Neuropsych: This patient is capable of making decisions on his own behalf. 6. Skin/Wound Care: continue wound VAC. Will monitor wound  for healing  7. Fluids/Electrolytes/Nutrition: Monitor I/O. Check lytes in am.  8. HTN: Monitor BP tid--on Norvasc, HCTZ and Cozaar.   9. CKD?: Monitor renal status with serial checks.  --K+ on higher end of normal. May need diet adjusted. Recheck labs in am.   --Baseline SCr1.2? 10. T2DM: Hgb A1C-7.7. Monitor BS ac/hs and use SSI for elevated BS. Titrate insulin as needed.   --Continue Jardiance, metformin (SCr borderline at 1.33), Levemir with 4 units tid ac for meal coverage.  11. Hyponatremia: Question due to acute on chronic renal failure. Recheck labs in am.  12. CAD s/p PTCA: Monitor for symptoms with increase in activity.  --On DAPT, Imdur, Cozaar and Jardiance.  13. Constipation: Will add Miralax bid for acute on chronic constipation.    ***  Bary Leriche, PA-C 12/15/2020

## 2020-12-15 NOTE — Consult Note (Signed)
   [facility] Inpatient Consult   12/15/2020  Keith Espinoza 05/18/1941 [id number]  [name]  Accountable Care Organization [ACO] Patient: Medicare DCE  PCP: [name], MD  Patient is currently active with [facility] for chronic disease management services.  Patient has been engaged by a [facility].  Chart review and reveals patient is a post op right lower Below Knee Amputation.  Plan:  Inpatient Transition Of Care [TOC] team member will be made aware that [facility] following. Chart review also reveals patient is being recommended for an inpatient rehab facility level of care.  Will follow for disposition.   Of note, [facility] Care Management services does not replace or interfere with any services that are needed or arranged by inpatient [facility] care management team.  For additional questions or referrals please contact:  Natividad Brood, RN BSN [facility] Liaison  [phone] business mobile phone Toll free office [phone]  Fax number: [phone] [email or web address]

## 2020-12-15 NOTE — Patient Outreach (Signed)
[facility]) Care Management  12/15/2020  DAY DEERY [birth year] [id number]   RN health coach discipline closure. Patient has been admitted in the hospital RN will be followed by Complex Care Coordinator once discharged.   [facility] [phone]

## 2020-12-15 NOTE — Progress Notes (Signed)
Physical Therapy Treatment Patient Details Name: Keith Espinoza MRN: [id number] DOB: 1940-12-17 Today's Date: 12/15/2020    History of Present Illness Pt adm for elective R TTA on 3/11 due to increasing ischemic pain and failed angioplasty of RLE.  PMH significant for heart dx, shoulder surgery x 2, R THR and revision, L TKR, back surgery x 3, DM, diabetic neuropathy mild.    PT Comments    Pt progressing towards goals. Continues to require min A for mobility tasks using RW. Pt with increased pain, but agreeable to therapies. Educated about importance of keeping knee straight to prevent contractures. Current recommendations appropriate. Will continue to follow acutely.    Follow Up Recommendations  CIR     Equipment Recommendations  Wheelchair cushion (measurements PT);Wheelchair (measurements PT) (right swing away amputee legrest, anti tippers)    Recommendations for Other Services       Precautions / Restrictions Precautions Precautions: Fall;Other (comment) Precaution Comments: wound vac Restrictions Weight Bearing Restrictions: Yes RLE Weight Bearing: Non weight bearing    Mobility  Bed Mobility Overal bed mobility: Needs Assistance Bed Mobility: Supine to Sit     Supine to sit: Supervision     General bed mobility comments: Supervision for safety.    Transfers Overall transfer level: Needs assistance Equipment used: Rolling walker (2 wheeled) Transfers: Sit to/from [facility] Sit to Stand: Min assist Stand pivot transfers: Min assist       General transfer comment: MinA for steadying assist to stand. Cues for hand placement. Min A for steadying to transfer to chair. Further mobility deferred as family present and talking to pt about his wife's prognosis.  Ambulation/Gait                 Stairs             Wheelchair Mobility    Modified Rankin (Stroke Patients Only)       Balance Overall balance assessment: Needs  assistance Sitting-balance support: No upper extremity supported;Feet supported Sitting balance-Leahy Scale: Good     Standing balance support: During functional activity;No upper extremity supported Standing balance-Leahy Scale: Poor Standing balance comment: Reliant on BUE and external support                            Cognition Arousal/Alertness: Awake/alert Behavior During Therapy: WFL for tasks assessed/performed Overall Cognitive Status: Within Functional Limits for tasks assessed                                        Exercises      General Comments General comments (skin integrity, edema, etc.): Positioned RLE to promote knee extension. Educated about importance of keeping knee straight to prevent contractures.      Pertinent Vitals/Pain Pain Assessment: Faces Faces Pain Scale: Hurts whole lot Pain Location: surgical site Pain Descriptors / Indicators: Throbbing;Aching Pain Intervention(s): Limited activity within patient's tolerance;Monitored during session;Repositioned    Home Living                      Prior Function            PT Goals (current goals can now be found in the care plan section) Acute Rehab PT Goals Patient Stated Goal: get back home PT Goal Formulation: With patient/family Time For Goal Achievement: 12/27/20 Potential to Achieve Goals:  Good Progress towards PT goals: Progressing toward goals    Frequency    Min 3X/week      PT Plan Current plan remains appropriate    Co-evaluation              AM-PAC PT "6 Clicks" Mobility   Outcome Measure  Help needed turning from your back to your side while in a flat bed without using bedrails?: None Help needed moving from lying on your back to sitting on the side of a flat bed without using bedrails?: None Help needed moving to and from a bed to a chair (including a wheelchair)?: A Little Help needed standing up from a chair using your arms  (e.g., wheelchair or bedside chair)?: A Little Help needed to walk in hospital room?: A Little Help needed climbing 3-5 steps with a railing? : A Lot 6 Click Score: 19    End of Session Equipment Utilized During Treatment: Gait belt Activity Tolerance: Patient tolerated treatment well Patient left: in chair;with call bell/phone within reach;with chair alarm set;with family/visitor present Nurse Communication: Mobility status;Patient requests pain meds (pt requesting to go see his wife.) PT Visit Diagnosis: Unsteadiness on feet (R26.81);Other abnormalities of gait and mobility (R26.89)     Time: 8185-6314 PT Time Calculation (min) (ACUTE ONLY): 20 min  Charges:  $Therapeutic Activity: 8-22 mins                     Lou Miner, DPT  Acute Rehabilitation Services  Pager: [phone] Office: [phone]    Rudean Hitt 12/15/2020, 1:50 PM

## 2020-12-15 NOTE — Telephone Encounter (Signed)
Patients daughter called she stated patient is in the re due to having surgery recently she stated his wife is in the er as well for cardiac arrest she is requesting a order stating the patient can go downstairs to see his wife call back:[phone]

## 2020-12-15 NOTE — Telephone Encounter (Signed)
I called and sw pt's daughter and advised that the hospital had contacted the PA and order has already been placed in chart to allow pt to go and se his wife in ICU. To call with any other questions.

## 2020-12-15 NOTE — Care Management Important Message (Signed)
Important Message  Patient Details  Name: Keith Espinoza MRN: [id number] Date of Birth: [birth year]   Medicare Important Message Given:  Yes     Orbie Pyo 12/15/2020, 4:27 PM

## 2020-12-15 NOTE — Progress Notes (Signed)
Inpatient Diabetes Program Recommendations  AACE/ADA: New Consensus Statement on Inpatient Glycemic Control (2015)  Target Ranges:  Prepandial:   less than 140 mg/dL      Peak postprandial:   less than 180 mg/dL (1-2 hours)      Critically ill patients:  140 - 180 mg/dL   Lab Results  Component Value Date   GLUCAP 239 (H) 12/15/2020   HGBA1C 7.7 (H) 12/12/2020    Review of Glycemic Control Results for CHRYSTIAN, CUPPLES (MRN [id number]) as of 12/15/2020 11:41  Ref. Range 12/14/2020 07:53 12/14/2020 12:14 12/14/2020 16:57 12/14/2020 21:39 12/15/2020 07:48  Glucose-Capillary Latest Ref Range: 70 - 99 mg/dL 174 (H) 147 (H) 217 (H) 203 (H) 239 (H)   Diabetes history: DM 2 Outpatient Diabetes medications: Levemir 12-20 units Daily, Metformin 1000 mg BID Current orders for Inpatient glycemic control:  Levemir 10 units Novolog 0-15 units tid Novolog 4 units tid meal coverage Metformin 1000 mg bid Jardiance 25 mg Daily  Inpatient Diabetes Program Recommendations:    -  Increase Levemir to 14 units  Thanks,  Tama Headings RN, MSN, BC-ADM Inpatient Diabetes Coordinator Team Pager [phone] (8a-5p)

## 2020-12-16 ENCOUNTER — Inpatient Hospital Stay (HOSPITAL_COMMUNITY)
Admission: RE | Admit: 2020-12-16 | Discharge: 2020-12-27 | DRG: 559 | Disposition: A | Discharge status: Home Health | Payer: Medicare Other | Source: Intra-hospital | Attending: Physical Medicine and Rehabilitation | Admitting: Physical Medicine and Rehabilitation

## 2020-12-16 ENCOUNTER — Encounter (HOSPITAL_COMMUNITY): Payer: Self-pay | Admitting: Physical Medicine and Rehabilitation

## 2020-12-16 ENCOUNTER — Other Ambulatory Visit: Payer: Self-pay

## 2020-12-16 DIAGNOSIS — I252 Old myocardial infarction: Secondary | ICD-10-CM | POA: Diagnosis not present

## 2020-12-16 DIAGNOSIS — G4700 Insomnia, unspecified: Secondary | ICD-10-CM | POA: Diagnosis present

## 2020-12-16 DIAGNOSIS — Z4781 Encounter for orthopedic aftercare following surgical amputation: Principal | ICD-10-CM

## 2020-12-16 DIAGNOSIS — E861 Hypovolemia: Secondary | ICD-10-CM | POA: Diagnosis present

## 2020-12-16 DIAGNOSIS — M109 Gout, unspecified: Secondary | ICD-10-CM | POA: Diagnosis present

## 2020-12-16 DIAGNOSIS — N179 Acute kidney failure, unspecified: Secondary | ICD-10-CM | POA: Diagnosis present

## 2020-12-16 DIAGNOSIS — Z955 Presence of coronary angioplasty implant and graft: Secondary | ICD-10-CM

## 2020-12-16 DIAGNOSIS — I2510 Atherosclerotic heart disease of native coronary artery without angina pectoris: Secondary | ICD-10-CM | POA: Diagnosis present

## 2020-12-16 DIAGNOSIS — E1151 Type 2 diabetes mellitus with diabetic peripheral angiopathy without gangrene: Secondary | ICD-10-CM | POA: Diagnosis present

## 2020-12-16 DIAGNOSIS — I10 Essential (primary) hypertension: Secondary | ICD-10-CM | POA: Diagnosis not present

## 2020-12-16 DIAGNOSIS — G8929 Other chronic pain: Secondary | ICD-10-CM | POA: Diagnosis present

## 2020-12-16 DIAGNOSIS — Z8249 Family history of ischemic heart disease and other diseases of the circulatory system: Secondary | ICD-10-CM

## 2020-12-16 DIAGNOSIS — Z96641 Presence of right artificial hip joint: Secondary | ICD-10-CM | POA: Diagnosis present

## 2020-12-16 DIAGNOSIS — Z885 Allergy status to narcotic agent status: Secondary | ICD-10-CM | POA: Diagnosis not present

## 2020-12-16 DIAGNOSIS — E1140 Type 2 diabetes mellitus with diabetic neuropathy, unspecified: Secondary | ICD-10-CM | POA: Diagnosis present

## 2020-12-16 DIAGNOSIS — E119 Type 2 diabetes mellitus without complications: Secondary | ICD-10-CM | POA: Diagnosis not present

## 2020-12-16 DIAGNOSIS — S88111A Complete traumatic amputation at level between knee and ankle, right lower leg, initial encounter: Secondary | ICD-10-CM | POA: Diagnosis present

## 2020-12-16 DIAGNOSIS — E43 Unspecified severe protein-calorie malnutrition: Secondary | ICD-10-CM | POA: Diagnosis present

## 2020-12-16 DIAGNOSIS — Z794 Long term (current) use of insulin: Secondary | ICD-10-CM

## 2020-12-16 DIAGNOSIS — D62 Acute posthemorrhagic anemia: Secondary | ICD-10-CM | POA: Diagnosis not present

## 2020-12-16 DIAGNOSIS — Z79899 Other long term (current) drug therapy: Secondary | ICD-10-CM

## 2020-12-16 DIAGNOSIS — Z89511 Acquired absence of right leg below knee: Secondary | ICD-10-CM | POA: Diagnosis not present

## 2020-12-16 DIAGNOSIS — Z87891 Personal history of nicotine dependence: Secondary | ICD-10-CM

## 2020-12-16 DIAGNOSIS — E871 Hypo-osmolality and hyponatremia: Secondary | ICD-10-CM | POA: Diagnosis not present

## 2020-12-16 DIAGNOSIS — Z7984 Long term (current) use of oral hypoglycemic drugs: Secondary | ICD-10-CM

## 2020-12-16 DIAGNOSIS — K5909 Other constipation: Secondary | ICD-10-CM | POA: Diagnosis present

## 2020-12-16 DIAGNOSIS — Z7982 Long term (current) use of aspirin: Secondary | ICD-10-CM

## 2020-12-16 DIAGNOSIS — E876 Hypokalemia: Secondary | ICD-10-CM | POA: Diagnosis present

## 2020-12-16 DIAGNOSIS — M545 Low back pain, unspecified: Secondary | ICD-10-CM | POA: Diagnosis present

## 2020-12-16 DIAGNOSIS — Z888 Allergy status to other drugs, medicaments and biological substances status: Secondary | ICD-10-CM | POA: Diagnosis not present

## 2020-12-16 DIAGNOSIS — G546 Phantom limb syndrome with pain: Secondary | ICD-10-CM | POA: Diagnosis present

## 2020-12-16 DIAGNOSIS — Z833 Family history of diabetes mellitus: Secondary | ICD-10-CM

## 2020-12-16 DIAGNOSIS — Z83438 Family history of other disorder of lipoprotein metabolism and other lipidemia: Secondary | ICD-10-CM

## 2020-12-16 DIAGNOSIS — R7989 Other specified abnormal findings of blood chemistry: Secondary | ICD-10-CM

## 2020-12-16 DIAGNOSIS — Z7902 Long term (current) use of antithrombotics/antiplatelets: Secondary | ICD-10-CM

## 2020-12-16 DIAGNOSIS — E118 Type 2 diabetes mellitus with unspecified complications: Secondary | ICD-10-CM

## 2020-12-16 DIAGNOSIS — Z96652 Presence of left artificial knee joint: Secondary | ICD-10-CM | POA: Diagnosis present

## 2020-12-16 DIAGNOSIS — I739 Peripheral vascular disease, unspecified: Secondary | ICD-10-CM | POA: Diagnosis not present

## 2020-12-16 LAB — GLUCOSE, CAPILLARY
Glucose-Capillary: 121 mg/dL — ABNORMAL HIGH (ref 70–99)
Glucose-Capillary: 106 mg/dL — ABNORMAL HIGH (ref 70–99)
Glucose-Capillary: 165 mg/dL — ABNORMAL HIGH (ref 70–99)
Glucose-Capillary: 145 mg/dL — ABNORMAL HIGH (ref 70–99)

## 2020-12-16 MED ORDER — DIPHENHYDRAMINE HCL 12.5 MG/5ML PO ELIX: 12.5000 mg | ORAL_SOLUTION | Freq: Four times a day (QID) | ORAL | Status: DC | PRN

## 2020-12-16 MED ORDER — NITROGLYCERIN 0.4 MG SL SUBL: 0.4000 mg | SUBLINGUAL_TABLET | SUBLINGUAL | Status: DC | PRN

## 2020-12-16 MED ORDER — HYDROCHLOROTHIAZIDE 25 MG PO TABS
12.5000 mg | ORAL_TABLET | Freq: Every day | ORAL | Status: DC
  Administered 2020-12-17 – 2020-12-27 (×11): 12.5 mg via ORAL
  Filled 2020-12-16 (×11): qty 1

## 2020-12-16 MED ORDER — TRAMADOL HCL 50 MG PO TABS
50.0000 mg | ORAL_TABLET | Freq: Four times a day (QID) | ORAL | Status: DC | PRN
  Administered 2020-12-16 – 2020-12-26 (×8): 50 mg via ORAL
  Filled 2020-12-16 (×8): qty 1

## 2020-12-16 MED ORDER — INSULIN ASPART 100 UNIT/ML ~~LOC~~ SOLN
0.0000 [IU] | Freq: Three times a day (TID) | SUBCUTANEOUS | Status: DC
  Administered 2020-12-16 – 2020-12-17 (×2): 3 [IU] via SUBCUTANEOUS
  Administered 2020-12-17: 5 [IU] via SUBCUTANEOUS
  Administered 2020-12-18: 3 [IU] via SUBCUTANEOUS
  Administered 2020-12-18: 5 [IU] via SUBCUTANEOUS
  Administered 2020-12-18: 3 [IU] via SUBCUTANEOUS
  Administered 2020-12-19 (×2): 8 [IU] via SUBCUTANEOUS
  Administered 2020-12-20: 5 [IU] via SUBCUTANEOUS
  Administered 2020-12-20 (×2): 3 [IU] via SUBCUTANEOUS
  Administered 2020-12-21: 5 [IU] via SUBCUTANEOUS
  Administered 2020-12-21 – 2020-12-23 (×4): 3 [IU] via SUBCUTANEOUS
  Administered 2020-12-23: 2 [IU] via SUBCUTANEOUS
  Administered 2020-12-24: 5 [IU] via SUBCUTANEOUS

## 2020-12-16 MED ORDER — CLOPIDOGREL BISULFATE 75 MG PO TABS
75.0000 mg | ORAL_TABLET | Freq: Every day | ORAL | Status: DC
  Administered 2020-12-17 – 2020-12-27 (×11): 75 mg via ORAL
  Filled 2020-12-16 (×11): qty 1

## 2020-12-16 MED ORDER — ACETAMINOPHEN 325 MG PO TABS
325.0000 mg | ORAL_TABLET | ORAL | Status: DC | PRN
  Administered 2020-12-23 – 2020-12-26 (×4): 650 mg via ORAL
  Filled 2020-12-16 (×4): qty 2
  Filled 2020-12-16: qty 1
  Filled 2020-12-16 (×2): qty 2

## 2020-12-16 MED ORDER — TERAZOSIN HCL 5 MG PO CAPS
5.0000 mg | ORAL_CAPSULE | Freq: Every day | ORAL | Status: DC
  Administered 2020-12-16 – 2020-12-26 (×10): 5 mg via ORAL
  Filled 2020-12-16 (×14): qty 1

## 2020-12-16 MED ORDER — METHOCARBAMOL 1000 MG/10ML IJ SOLN
500.0000 mg | Freq: Four times a day (QID) | INTRAVENOUS | Status: DC | PRN
  Filled 2020-12-16: qty 5

## 2020-12-16 MED ORDER — VITAMIN B-12 1000 MCG PO TABS
2500.0000 ug | ORAL_TABLET | Freq: Every day | ORAL | Status: DC
  Administered 2020-12-17 – 2020-12-18 (×2): 2500 ug via ORAL
  Filled 2020-12-16 (×2): qty 3

## 2020-12-16 MED ORDER — ASPIRIN EC 81 MG PO TBEC
81.0000 mg | DELAYED_RELEASE_TABLET | Freq: Every day | ORAL | Status: DC
  Administered 2020-12-17 – 2020-12-27 (×11): 81 mg via ORAL
  Filled 2020-12-16 (×11): qty 1

## 2020-12-16 MED ORDER — ALLOPURINOL 300 MG PO TABS
300.0000 mg | ORAL_TABLET | Freq: Every day | ORAL | Status: DC
  Administered 2020-12-17 – 2020-12-27 (×11): 300 mg via ORAL
  Filled 2020-12-16 (×11): qty 1

## 2020-12-16 MED ORDER — GUAIFENESIN-DM 100-10 MG/5ML PO SYRP
5.0000 mL | ORAL_SOLUTION | Freq: Four times a day (QID) | ORAL | Status: DC | PRN
  Filled 2020-12-16: qty 10

## 2020-12-16 MED ORDER — BISACODYL 10 MG RE SUPP: 10.0000 mg | Freq: Every day | RECTAL | Status: DC | PRN

## 2020-12-16 MED ORDER — VITAMIN D3 25 MCG (1000 UNIT) PO TABS
2000.0000 [IU] | ORAL_TABLET | Freq: Every day | ORAL | Status: DC
  Administered 2020-12-17 – 2020-12-27 (×11): 2000 [IU] via ORAL
  Filled 2020-12-16 (×22): qty 2

## 2020-12-16 MED ORDER — POLYETHYLENE GLYCOL 3350 17 G PO PACK: 17.0000 g | PACK | Freq: Every day | ORAL | Status: DC | PRN

## 2020-12-16 MED ORDER — OXYCODONE HCL 5 MG PO TABS
5.0000 mg | ORAL_TABLET | ORAL | Status: DC | PRN
  Administered 2020-12-17: 10 mg via ORAL
  Administered 2020-12-18: 5 mg via ORAL
  Administered 2020-12-19 – 2020-12-22 (×4): 10 mg via ORAL
  Filled 2020-12-16 (×3): qty 2
  Filled 2020-12-16: qty 1
  Filled 2020-12-16 (×2): qty 2

## 2020-12-16 MED ORDER — PROCHLORPERAZINE EDISYLATE 10 MG/2ML IJ SOLN: 5.0000 mg | Freq: Four times a day (QID) | INTRAMUSCULAR | Status: DC | PRN

## 2020-12-16 MED ORDER — METFORMIN HCL 500 MG PO TABS
1000.0000 mg | ORAL_TABLET | Freq: Two times a day (BID) | ORAL | Status: DC
  Administered 2020-12-16 – 2020-12-27 (×22): 1000 mg via ORAL
  Filled 2020-12-16 (×22): qty 2

## 2020-12-16 MED ORDER — TRAZODONE HCL 50 MG PO TABS
25.0000 mg | ORAL_TABLET | Freq: Every evening | ORAL | Status: DC | PRN
  Administered 2020-12-17 – 2020-12-26 (×9): 50 mg via ORAL
  Filled 2020-12-16 (×9): qty 1

## 2020-12-16 MED ORDER — ENOXAPARIN SODIUM 40 MG/0.4ML ~~LOC~~ SOLN
40.0000 mg | SUBCUTANEOUS | Status: DC
  Administered 2020-12-16 – 2020-12-26 (×11): 40 mg via SUBCUTANEOUS
  Filled 2020-12-16 (×11): qty 0.4

## 2020-12-16 MED ORDER — OXYCODONE HCL ER 10 MG PO T12A
10.0000 mg | EXTENDED_RELEASE_TABLET | Freq: Two times a day (BID) | ORAL | Status: DC
  Administered 2020-12-16 – 2020-12-19 (×6): 10 mg via ORAL
  Filled 2020-12-16 (×6): qty 1

## 2020-12-16 MED ORDER — LOSARTAN POTASSIUM 50 MG PO TABS
50.0000 mg | ORAL_TABLET | Freq: Every day | ORAL | Status: DC
  Administered 2020-12-17 – 2020-12-27 (×11): 50 mg via ORAL
  Filled 2020-12-16 (×11): qty 1

## 2020-12-16 MED ORDER — PROCHLORPERAZINE MALEATE 5 MG PO TABS
5.0000 mg | ORAL_TABLET | Freq: Four times a day (QID) | ORAL | Status: DC | PRN
  Filled 2020-12-16: qty 2

## 2020-12-16 MED ORDER — EMPAGLIFLOZIN 25 MG PO TABS
25.0000 mg | ORAL_TABLET | Freq: Every day | ORAL | Status: DC
  Administered 2020-12-17 – 2020-12-27 (×11): 25 mg via ORAL
  Filled 2020-12-16 (×12): qty 1

## 2020-12-16 MED ORDER — METHOCARBAMOL 500 MG PO TABS
500.0000 mg | ORAL_TABLET | Freq: Four times a day (QID) | ORAL | Status: DC | PRN
Start: 2020-12-16 — End: 2020-12-27
  Administered 2020-12-23 – 2020-12-26 (×3): 500 mg via ORAL
  Filled 2020-12-16 (×4): qty 1

## 2020-12-16 MED ORDER — ALUM & MAG HYDROXIDE-SIMETH 200-200-20 MG/5ML PO SUSP: 30.0000 mL | ORAL | Status: DC | PRN

## 2020-12-16 MED ORDER — ISOSORBIDE MONONITRATE ER 30 MG PO TB24
30.0000 mg | ORAL_TABLET | Freq: Every day | ORAL | Status: DC
  Administered 2020-12-17 – 2020-12-27 (×11): 30 mg via ORAL
  Filled 2020-12-16 (×11): qty 1

## 2020-12-16 MED ORDER — POLYETHYLENE GLYCOL 3350 17 G PO PACK
17.0000 g | PACK | Freq: Two times a day (BID) | ORAL | Status: DC
  Administered 2020-12-16 – 2020-12-19 (×4): 17 g via ORAL
  Filled 2020-12-16 (×10): qty 1

## 2020-12-16 MED ORDER — AMLODIPINE BESYLATE 5 MG PO TABS
5.0000 mg | ORAL_TABLET | Freq: Every day | ORAL | Status: DC
  Administered 2020-12-17 – 2020-12-27 (×11): 5 mg via ORAL
  Filled 2020-12-16 (×11): qty 1

## 2020-12-16 MED ORDER — INSULIN DETEMIR 100 UNIT/ML ~~LOC~~ SOLN
10.0000 [IU] | Freq: Every day | SUBCUTANEOUS | Status: DC
  Administered 2020-12-16 – 2020-12-19 (×4): 10 [IU] via SUBCUTANEOUS
  Filled 2020-12-16 (×5): qty 0.1

## 2020-12-16 MED ORDER — PROCHLORPERAZINE 25 MG RE SUPP
12.5000 mg | Freq: Four times a day (QID) | RECTAL | Status: DC | PRN
  Filled 2020-12-16: qty 1

## 2020-12-16 MED ORDER — GABAPENTIN 300 MG PO CAPS
300.0000 mg | ORAL_CAPSULE | Freq: Three times a day (TID) | ORAL | Status: DC
  Administered 2020-12-16 – 2020-12-25 (×26): 300 mg via ORAL
  Filled 2020-12-16 (×26): qty 1

## 2020-12-16 MED ORDER — INSULIN ASPART 100 UNIT/ML ~~LOC~~ SOLN
4.0000 [IU] | Freq: Three times a day (TID) | SUBCUTANEOUS | Status: DC
  Administered 2020-12-16 – 2020-12-17 (×2): 4 [IU] via SUBCUTANEOUS

## 2020-12-16 MED ORDER — FLEET ENEMA 7-19 GM/118ML RE ENEM: 1.0000 | ENEMA | Freq: Once | RECTAL | Status: DC | PRN

## 2020-12-16 NOTE — Progress Notes (Signed)
Patient admitted to 5C-04 via wheelchair.  Oriented to floor and rehab policy.  Complains of pain and given medication for comfort.  Patient resting with all needs within reach.  Octavia Bruckner, RN

## 2020-12-16 NOTE — Discharge Summary (Signed)
Discharge Diagnoses:  Active Problems:   Gangrene of right foot ([place])   Below-knee amputation of right lower extremity ([place])   Surgeries: Procedure(s): RIGHT BELOW KNEE AMPUTATION on 12/12/2020    Consultants:   Discharged Condition: Improved  Hospital Course: Keith Espinoza is an 80 y.o. male who was admitted 12/12/2020 with a chief complaint of right foot gangrene, with a final diagnosis of Gangrene Right Foot.  Patient was brought to the operating room on 12/12/2020 and underwent Procedure(s): RIGHT BELOW KNEE AMPUTATION.    Patient was given perioperative antibiotics:  Anti-infectives (From admission, onward)   Start     Dose/Rate Route Frequency Ordered Stop   12/12/20 2000  ceFAZolin (ANCEF) IVPB 1 g/50 mL premix        1 g 100 mL/hr over 30 Minutes Intravenous Every 8 hours 12/12/20 1649 12/13/20 1301   12/12/20 1030  ceFAZolin (ANCEF) IVPB 2g/100 mL premix        2 g 200 mL/hr over 30 Minutes Intravenous On call to O.R. 12/12/20 1018 12/12/20 1400   12/12/20 1022  ceFAZolin (ANCEF) 2-4 GM/100ML-% IVPB       Note to Pharmacy: Ladoris Gene   : cabinet override      12/12/20 1022 12/12/20 1351    .  Patient was given sequential compression devices, early ambulation, and aspirin for DVT prophylaxis.  Recent vital signs:  Patient Vitals for the past 24 hrs:  BP Temp Temp src Pulse Resp SpO2  12/16/20 0449 128/60 97.9 F (36.6 C) Oral 84 16 99 %  12/15/20 2026 (!) 120/55 97.8 F (36.6 C) Oral 82 16 100 %  .  Recent laboratory studies: No results found.  Discharge Medications:   Allergies as of 12/16/2020      Reactions   Simvastatin Other (See Comments)   SEVERE MYALGIAS   Zetia [ezetimibe] Other (See Comments)   MYALGIAS   Dilaudid [hydromorphone Hcl] Other (See Comments)   hallucination      Medication List    TAKE these medications   allopurinol 300 MG tablet Commonly known as: ZYLOPRIM Take 300 mg by mouth daily.   amLODipine 5 MG tablet Commonly  known as: NORVASC Take 5 mg by mouth daily.   aspirin EC 81 MG tablet Take 81 mg by mouth daily.   B-12 2500 MCG Tabs Take 2,500 mcg by mouth daily.   clopidogrel 75 MG tablet Commonly known as: PLAVIX TAKE ONE TABLET BY MOUTH ONE TIME DAILY   empagliflozin 25 MG Tabs tablet Commonly known as: JARDIANCE Take 25 mg by mouth daily.   gabapentin 300 MG capsule Commonly known as: NEURONTIN Take 300 mg by mouth 2 (two) times daily.   hydrochlorothiazide 12.5 MG tablet Commonly known as: HYDRODIURIL Take 12.5 mg by mouth daily.   insulin detemir 100 UNIT/ML injection Commonly known as: LEVEMIR Inject 12-20 Units into the skin daily as needed (blood sugar above 150).   isosorbide mononitrate 30 MG 24 hr tablet Commonly known as: IMDUR Take 1 tablet (30 mg total) by mouth daily. Please schedule yearly appointment for future refills. Thank you   Klor-Con M20 20 MEQ tablet Generic drug: potassium chloride SA Take 20 mEq by mouth daily.   losartan 50 MG tablet Commonly known as: COZAAR Take 50 mg by mouth daily.   metFORMIN 1000 MG tablet Commonly known as: GLUCOPHAGE Take 1 tablet (1,000 mg total) by mouth 2 (two) times daily.   nitroGLYCERIN 0.4 MG SL tablet Commonly known as: NITROSTAT Place  0.4 mg under the tongue every 5 (five) minutes x 3 doses as needed for chest pain.   ONE TOUCH ULTRA TEST test strip Generic drug: glucose blood CHECK BLOOD SUGAR ONCE DAILY AS DIRECTED   terazosin 5 MG capsule Commonly known as: HYTRIN Take 5 mg by mouth at bedtime.   Vitamin D3 50 MCG (2000 UT) Tabs Take 2,000 Units by mouth daily.       Diagnostic Studies: VAS [place] ABI WITH/WO TBI  Result Date: 11/17/2020 LOWER EXTREMITY DOPPLER STUDY Indications: Gangrene, and peripheral artery disease. High Risk Factors: Hypertension.  Vascular Interventions: Right posterior tibial artery angioplasty 08/08/2020 Performing Technologist: Ronal Fear RVS, RCS  Examination Guidelines: A  complete evaluation includes at minimum, Doppler waveform signals and systolic blood pressure reading at the level of bilateral brachial, anterior tibial, and posterior tibial arteries, when vessel segments are accessible. Bilateral testing is considered an integral part of a complete examination. Photoelectric Plethysmograph (PPG) waveforms and toe systolic pressure readings are included as required and additional duplex testing as needed. Limited examinations for reoccurring indications may be performed as noted.  ABI Findings: +---------+------------------+-----+-------------------+--------+ Right    Rt Pressure (mmHg)IndexWaveform           Comment  +---------+------------------+-----+-------------------+--------+ Brachial 155                                                +---------+------------------+-----+-------------------+--------+ PTA      78                0.50 dampened monophasic         +---------+------------------+-----+-------------------+--------+ DP       62                0.40 dampened monophasic         +---------+------------------+-----+-------------------+--------+ Great Toe                                          bandage  +---------+------------------+-----+-------------------+--------+ +---------+------------------+-----+----------+-------+ Left     Lt Pressure (mmHg)IndexWaveform  Comment +---------+------------------+-----+----------+-------+ Brachial 153                                      +---------+------------------+-----+----------+-------+ PTA      159               1.03 monophasic        +---------+------------------+-----+----------+-------+ DP       119               0.77 monophasic        +---------+------------------+-----+----------+-------+ Great Toe77                0.50 Abnormal          +---------+------------------+-----+----------+-------+ +-------+-----------+-----------+------------+------------+  ABI/TBIToday's ABIToday's TBIPrevious ABIPrevious TBI +-------+-----------+-----------+------------+------------+ Right  0.50       bandage    unreliable               +-------+-----------+-----------+------------+------------+ Left   unreliable 0.50       unreliable  0.50         +-------+-----------+-----------+------------+------------+  Arterial wall calcification precludes accurate ankle pressures and ABIs.  Summary: Right: ABIs are unreliable. Left: ABIs  are unreliable.  *See table(s) above for measurements and observations.  Electronically signed by Harold Barban MD on 11/17/2020 at 5:23:58 PM.    Final    VAS [place] LOWER EXTREMITY ARTERIAL DUPLEX  Result Date: 11/17/2020 LOWER EXTREMITY ARTERIAL DUPLEX STUDY Indications: Patient called triage to report pain to the anterior aspect of the              right lower leg when he mashes on it. He is worried he is getting              an infection.              Gangrene, and peripheral artery disease. High Risk Factors: Hypertension.  Vascular Interventions: Right posterior tibial artery angioplasty 08/08/2020. Current ABI:            ABIs unreliable Comparison Study: R LE Duplex 08/14/2020 noted mid SFA stenosis 50-74%. Performing Technologist: Ronal Fear RVS, RCS  Examination Guidelines: A complete evaluation includes B-mode imaging, spectral Doppler, color Doppler, and power Doppler as needed of all accessible portions of each vessel. Bilateral testing is considered an integral part of a complete examination. Limited examinations for reoccurring indications may be performed as noted.  +----------+--------+-----+---------------+-------------------+--------+ RIGHT     PSV cm/sRatioStenosis       Waveform           Comments +----------+--------+-----+---------------+-------------------+--------+ CFA Distal80                          biphasic                     +----------+--------+-----+---------------+-------------------+--------+ DFA       86                          biphasic                    +----------+--------+-----+---------------+-------------------+--------+ SFA Prox  80                          biphasic                    +----------+--------+-----+---------------+-------------------+--------+ SFA Mid   191          50-74% stenosisbiphasic                    +----------+--------+-----+---------------+-------------------+--------+ SFA Distal43                          monophasic                  +----------+--------+-----+---------------+-------------------+--------+ POP Prox  38                          monophasic                  +----------+--------+-----+---------------+-------------------+--------+ POP Distal26                          monophasic                  +----------+--------+-----+---------------+-------------------+--------+ TP Trunk  30                          monophasic                  +----------+--------+-----+---------------+-------------------+--------+  ATA Distal35                          monophasic                  +----------+--------+-----+---------------+-------------------+--------+ PTA Prox  12                          dampened monophasic         +----------+--------+-----+---------------+-------------------+--------+ PTA Mid   34                          monophasic                  +----------+--------+-----+---------------+-------------------+--------+ PTA Distal26                          monophasic                  +----------+--------+-----+---------------+-------------------+--------+ A focal velocity elevation of 191 cm/s was obtained at mid SFA with a VR of 2.4. Findings are characteristic of 50-74% stenosis.   Summary: Right: 50-74% stenosis noted in the mid superficial femoral artery. This appears unchanged in comparison to the Duplex performed  08/14/2020.  See table(s) above for measurements and observations. Electronically signed by Harold Barban MD on 11/17/2020 at 5:23:12 PM.    Final     Patient benefited maximally from their hospital stay and there were no complications.     Disposition: Discharge disposition: 02-Transferred to [facility]      Discharge Instructions    Apply dressing   Complete by: As directed    Remove vac 7 days post op or if stops working. Then May begin daily cleansing with antibacterial soap and water. Apply shrinker. Does not need limb protector in bed   Call MD / Call 911   Complete by: As directed    If you experience chest pain or shortness of breath, CALL 911 and be transported to the hospital emergency room.  If you develope a fever above 101 F, pus (white drainage) or increased drainage or redness at the wound, or calf pain, call your surgeon's office.   Constipation Prevention   Complete by: As directed    Drink plenty of fluids.  Prune juice may be helpful.  You may use a stool softener, such as Colace (over the counter) 100 mg twice a day.  Use MiraLax (over the counter) for constipation as needed.   Diet - low sodium heart healthy   Complete by: As directed    Increase activity slowly as tolerated   Complete by: As directed       Follow-up Information    Suzan Slick, NP Follow up in 1 week(s).   Specialty: Orthopedic Surgery Why: 1 week after discharge from rehab Contact information: [place] [address] [phone]                Signed: Bevely Palmer Kenzo Ozment 12/16/2020, 11:45 AM

## 2020-12-16 NOTE — Progress Notes (Signed)
Inpatient Rehabilitation Medication Review by a Pharmacist  A complete drug regimen review was completed for this patient to identify any potential clinically significant medication issues.  Clinically significant medication issues were identified:  None  Check AMION for pharmacist assigned to patient if future medication questions/issues arise during this admission.  Pharmacist comments: none  Time spent performing this drug regimen review (minutes):  10   Efraim Kaufmann, PharmD, BCPS 12/16/2020 6:27 PM

## 2020-12-16 NOTE — Progress Notes (Signed)
Patient is lying in bed easily awake.  He is sad because his wife is currently in the ICU with a poor prognosis.  He did get to see her yesterday and was appreciative of that  Vital signs stable afebrile the wound VAC is functioning with 2 green checks 0 cc in the canister patient was screened for inpatient rehab difficult for him to make decisions yesterday because of concerns for his wife.  Rehab to touch base with him and his family again today

## 2020-12-16 NOTE — Progress Notes (Signed)
Physical Therapy Treatment Patient Details Name: Keith Espinoza MRN: [id number] DOB: 05-17-1941 Today's Date: 12/16/2020    History of Present Illness Pt adm for elective R TTA on 3/11 due to increasing ischemic pain and failed angioplasty of RLE.  PMH significant for heart dx, shoulder surgery x 2, R THR and revision, L TKR, back surgery x 3, DM, diabetic neuropathy mild.    PT Comments    Pt motivated and with good pain control except with ext stretch.  Good participation with exercises and short distance ambulation with assist.  Pt does have difficulty achieving full extension with tight hamstrings.  Spent increased time working on AROM, AAROM, stretching, and reciprocal inhibition (quad sets) of hamstrings to promote full extension.  Educated on importance resting with leg straight and performing stretches in order to obtain prosthetic in future - pt reports difficult to rest with it straight due to pain.  Encouraged to at least begin stretching to straight and progressing the amount of time he can maintain straight.     Follow Up Recommendations  CIR     Equipment Recommendations  Wheelchair cushion (measurements PT);Wheelchair (measurements PT) (right swing away amputee legrest,  anti tippers)    Recommendations for Other Services       Precautions / Restrictions Precautions Precautions: Fall;Other (comment) Precaution Comments: wound vac Required Braces or Orthoses: Other Brace Other Brace: stump shrinker/shaper RLE Restrictions RLE Weight Bearing: Non weight bearing    Mobility  Bed Mobility Overal bed mobility: Modified Independent             General bed mobility comments: Sitting on EOB at arrival    Transfers Overall transfer level: Needs assistance Equipment used: Rolling walker (2 wheeled) Transfers: Sit to/from Stand Sit to Stand: Min assist         General transfer comment: MinA for steadying assist to stand. Cues for hand  placement.  Ambulation/Gait Ambulation/Gait assistance: Min assist Gait Distance (Feet): 18 Feet Assistive device: Rolling walker (2 wheeled)   Gait velocity: decreased   General Gait Details: Hop to pattern, min A to steady; cues for RW proximity   Stairs             Wheelchair Mobility    Modified Rankin (Stroke Patients Only)       Balance Overall balance assessment: Needs assistance Sitting-balance support: No upper extremity supported;Feet supported Sitting balance-Leahy Scale: Good     Standing balance support: During functional activity;Bilateral upper extremity supported Standing balance-Leahy Scale: Poor Standing balance comment: Reliant on BUE and external support                            Cognition Arousal/Alertness: Awake/alert Behavior During Therapy: WFL for tasks assessed/performed Overall Cognitive Status: Within Functional Limits for tasks assessed                                        Exercises Amputee Exercises Quad Sets: AROM;Right;Seated;20 reps Hip Flexion/Marching: Both;Seated;20 reps Knee Flexion: AROM;Right;15 reps (also performed gentle stretch x 3 with 30 sec hold) Knee Extension: AROM;Right;20 reps;Seated (Also performed extension stretch x 5 with 30 sec hold and quad sets)    General Comments General comments (skin integrity, edema, etc.): VSS.  Educated on importance of resting with knee straight and working to obtain full extension for prosthetic in future.  Currently R knee  lacking ~15-20 degrees ext, 90 flexion      Pertinent Vitals/Pain Pain Assessment: Faces Faces Pain Scale: Hurts even more Pain Location: surgical site Pain Descriptors / Indicators: Sharp Pain Intervention(s): Limited activity within patient's tolerance;Monitored during session;Repositioned    Home Living                      Prior Function            PT Goals (current goals can now be found in the care plan  section) Acute Rehab PT Goals Patient Stated Goal: get back home PT Goal Formulation: With patient Time For Goal Achievement: 12/27/20 Potential to Achieve Goals: Good Progress towards PT goals: Progressing toward goals    Frequency    Min 3X/week      PT Plan Current plan remains appropriate    Co-evaluation              AM-PAC PT "6 Clicks" Mobility   Outcome Measure  Help needed turning from your back to your side while in a flat bed without using bedrails?: None Help needed moving from lying on your back to sitting on the side of a flat bed without using bedrails?: None Help needed moving to and from a bed to a chair (including a wheelchair)?: A Little Help needed standing up from a chair using your arms (e.g., wheelchair or bedside chair)?: A Little Help needed to walk in hospital room?: A Little Help needed climbing 3-5 steps with a railing? : A Lot 6 Click Score: 19    End of Session Equipment Utilized During Treatment: Gait belt Activity Tolerance: Patient tolerated treatment well Patient left: in bed;with call bell/phone within reach;with bed alarm set Nurse Communication: Mobility status PT Visit Diagnosis: Unsteadiness on feet (R26.81);Other abnormalities of gait and mobility (R26.89)     Time: 1345-1413 PT Time Calculation (min) (ACUTE ONLY): 28 min  Charges:  $Gait Training: 8-22 mins $Therapeutic Exercise: 8-22 mins                     Abran Richard, PT Acute Rehab Services Pager [phone] [facility] 12/16/2020, 2:22 PM

## 2020-12-16 NOTE — Progress Notes (Signed)
Inpatient Rehab Admissions Coordinator:   Met with patient and his family at the bedside.  All are in agreement for CIR admission.  I explained ELOS of about 2 weeks with goals of supervision to mod I.  Also explained that pt would not receive a prosthesis while on rehab, that typically prosthetics take several months as pts have to wait for edema to reduce and the limb to shape.  Plan for pt to d/c to his home with support from daughter and grand daughters.  Bevely Palmer, PA-C, in agreement for d/c to CIR today so will plan to admit.   Shann Medal, PT, DPT Admissions Coordinator [phone] 12/16/20  11:40 AM

## 2020-12-16 NOTE — Progress Notes (Signed)
Inpatient Rehabilitation  Patient information reviewed and entered into eRehab system by Melissa M. Bowie, M.A., CCC/SLP, PPS Coordinator.  Information including medical coding, functional ability and quality indicators will be reviewed and updated through discharge.    

## 2020-12-16 NOTE — PMR Pre-admission (Signed)
PMR Admission Coordinator Pre-Admission Assessment  Patient: Keith Espinoza is an 80 y.o., male MRN: [id number] DOB: 1941-01-05 Height: '5\' 10"'  (177.8 cm) Weight: 72.6 kg  Insurance Information HMO:     PPO:      PCP:      IPA:      80/20:      OTHER:  PRIMARY: Medicare A and B      Policy#: [id number]      Subscriber: pt CM Name:       Phone#:      Fax#:  Pre-Cert#: verified Civil engineer, contracting:  Benefits:  Phone #:      Name:  Eff. Date: A and B 11/04/04     Deduct: $1556      Out of Pocket Max: n/a      Life Max: n/a CIR: 100%      SNF: 20 full days Outpatient: 80%     Co-Pay: 20% Home Health: 100%      Co-Pay:  DME: 80%     Co-Ins: 20% Providers: pt choice  SECONDARY: [employer]      Policy#: [id number]     Phone#:   Financial Counselor:        Phone#:   The "Data Collection Information Summary" for patients in Inpatient Rehabilitation Facilities with attached "Privacy Act [place] Records" was provided and verbally reviewed with: Patient and Family  Emergency Contact Information Contact Information    Name Relation Home Work Murray Spouse [phone]  [phone]   Dominic Pea Daughter [phone]  [phone]      Current Medical History  Patient Admitting Diagnosis: BKA   History of Present Illness: Pt is a 80 y/o male with PMH of heart disease, multiple orthopedic surgeries, DM< and neuropathy admitted to [facility] on 12/12/20 with progressive ischemic pain in the RLE.  Pt underwent angioplasty with VVS, which was unsuccessful.  Pt presented with RLE dry gangrene and was recommended for R BKA, which he underwent with Dr. Sharol Given on 3/11. Post op course pain management.  Therapy evaluations were completed and pt was recommended for CIR.     Patient's medical record from [facility] has been reviewed by the rehabilitation admission coordinator and physician.  Past Medical History  Past Medical History:  Diagnosis Date  . Allergic  rhinitis   . Allergic rhinitis   . Arthritis   . Basal cell carcinoma 11/01/2019    bcc left chest treatment [place] cx3 32f   . Chronic leg pain    right  . Chronic lower back pain   . Coronary artery disease    a. Stenting to RCA 2004; staged DES to LAD and Cx 2004. DES to mRCA 2012. b. DES to mCx, PTCA to dCx 11/2011. c. Lateral wall MI 2013 s/p PTCA to distal Cx & DES to mid OM2 11/2011. d. Low risk nuc 04/2014, EF wnl.  . Diabetes mellitus    Insulin dependent  . Diabetic neuropathy (HCC)    MILD  . Diverticulosis   . Dysrhythmia   . Gilbert syndrome   . Gout    right wrist; right foot; right elbow; have had it since 1970's  . H/O hiatal hernia   . Heart murmur   . History of echocardiogram    aortic sclerosis per echo 12/09 EF 65%, otherwise normal  . History of hemorrhoids    BLEEDING  . History of kidney stones    h/o  . Hypertension  Diagnosed 1995   . Myocardial infarction ([place])   . Pancreatic pseudocyst    a. s/p remote drainage 2006.  Marland Kitchen Thrombocytopenia ([place])    Seen on oldest labs in system from 2004  . Vitamin B 12 deficiency    orally replaced    Family History   family history includes Cancer in his brother and father; Diabetes in his mother and sister; Hyperlipidemia in his mother; Hypertension in his father, mother, and sister.  Prior Rehab/Hospitalizations Has the patient had prior rehab or hospitalizations prior to admission? Yes  Has the patient had major surgery during 100 days prior to admission? Yes   Current Medications  Current Facility-Administered Medications:  .  0.9 %  sodium chloride infusion, , Intravenous, Continuous, [name], MD .  acetaminophen (TYLENOL) tablet 325-650 mg, 325-650 mg, Oral, Q6H PRN, [name], MD, 325 mg at 12/16/20 0058 .  allopurinol (ZYLOPRIM) tablet 300 mg, 300 mg, Oral, Daily, [name], MD, 300 mg at 12/16/20 0943 .  amLODipine (NORVASC) tablet 5 mg, 5 mg, Oral, Daily, [name], MD, 5 mg at  12/16/20 0943 .  aspirin EC tablet 81 mg, 81 mg, Oral, Daily, [name], MD, 81 mg at 12/16/20 0943 .  bisacodyl (DULCOLAX) suppository 10 mg, 10 mg, Rectal, Daily PRN, [name], MD .  cholecalciferol (VITAMIN D) tablet 2,000 Units, 2,000 Units, Oral, Daily, [name], MD, 2,000 Units at 12/16/20 [phone] .  clopidogrel (PLAVIX) tablet 75 mg, 75 mg, Oral, Daily, [name], MD, 75 mg at 12/16/20 0943 .  docusate sodium (COLACE) capsule 100 mg, 100 mg, Oral, BID, [name], MD, 100 mg at 12/16/20 0942 .  empagliflozin (JARDIANCE) tablet 25 mg, 25 mg, Oral, Daily, [name], MD, 25 mg at 12/16/20 0946 .  gabapentin (NEURONTIN) capsule 300 mg, 300 mg, Oral, BID, [name], MD, 300 mg at 12/16/20 0943 .  hydrochlorothiazide (HYDRODIURIL) tablet 12.5 mg, 12.5 mg, Oral, Daily, [name], MD, 12.5 mg at 12/16/20 0945 .  insulin aspart (novoLOG) injection 0-15 Units, 0-15 Units, Subcutaneous, TID WC, [name], MD, 2 Units at 12/16/20 0749 .  insulin aspart (novoLOG) injection 4 Units, 4 Units, Subcutaneous, TID WC, [name], MD, 4 Units at 12/16/20 [phone] .  insulin detemir (LEVEMIR) injection 10 Units, 10 Units, Subcutaneous, QHS, [name], MD, 10 Units at 12/15/20 2127 .  isosorbide mononitrate (IMDUR) 24 hr tablet 30 mg, 30 mg, Oral, Daily, [name], MD, 30 mg at 12/16/20 0943 .  losartan (COZAAR) tablet 50 mg, 50 mg, Oral, Daily, [name], MD, 50 mg at 12/16/20 0942 .  magnesium citrate solution 1 Bottle, 1 Bottle, Oral, Once PRN, [name], MD .  metFORMIN (GLUCOPHAGE) tablet 1,000 mg, 1,000 mg, Oral, BID WC, [name], MD, 1,000 mg at 12/16/20 0747 .  methocarbamol (ROBAXIN) tablet 500 mg, 500 mg, Oral, Q6H PRN, 500 mg at 12/16/20 0700 **OR** methocarbamol (ROBAXIN) 500 mg in dextrose 5 % 50 mL IVPB, 500 mg, Intravenous, Q6H PRN, [name], MD .  metoCLOPramide (REGLAN) tablet 5-10 mg, 5-10 mg, Oral, Q8H PRN **OR**  metoCLOPramide (REGLAN) injection 5-10 mg, 5-10 mg, Intravenous, Q8H PRN, [name], MD .  morphine 2 MG/ML injection 0.5-1 mg, 0.5-1 mg, Intravenous, Q2H PRN, [name], MD, 1 mg at 12/15/20 1532 .  nitroGLYCERIN (NITROSTAT) SL tablet 0.4 mg, 0.4 mg, Sublingual, Q5 Min x  3 PRN, [name], MD .  ondansetron [facility]) tablet 4 mg, 4 mg, Oral, Q6H PRN **OR** ondansetron (ZOFRAN) injection 4 mg, 4 mg, Intravenous, Q6H PRN, [name], MD .  oxyCODONE (Oxy IR/ROXICODONE) immediate release tablet 5-10 mg, 5-10 mg, Oral, Q4H PRN, Meredith Pel, MD, 10 mg at 12/16/20 0747 .  polyethylene glycol (MIRALAX / GLYCOLAX) packet 17 g, 17 g, Oral, Daily PRN, [name], MD .  terazosin (HYTRIN) capsule 5 mg, 5 mg, Oral, QHS, [name], MD, 5 mg at 12/15/20 2126 .  vitamin B-12 (CYANOCOBALAMIN) tablet 2,500 mcg, 2,500 mcg, Oral, Daily, [name], MD, 2,500 mcg at 12/16/20 8588  Patients Current Diet:  Diet Order            Diet - low sodium heart healthy           Diet Carb Modified Fluid consistency: Thin; Room service appropriate? Yes  Diet effective now                 Precautions / Restrictions Precautions Precautions: Fall,Other (comment) Precaution Comments: wound vac Other Brace: stump shrinker/shaper RLE Restrictions Weight Bearing Restrictions: (P) Yes RLE Weight Bearing: Non weight bearing   Has the patient had 2 or more falls or a fall with injury in the past year? No  Prior Activity Level Community (5-7x/wk): no DME at baseline, worked in the yard/gardens, drove wife to HD 3x/week  Prior Functional Level Self Care: Did the patient need help bathing, dressing, using the toilet or eating? Independent  Indoor Mobility: Did the patient need assistance with walking from room to room (with or without device)? Independent  Stairs: Did the patient need assistance with internal or external stairs (with or without device)? Independent  Functional  Cognition: Did the patient need help planning regular tasks such as shopping or remembering to take medications? Independent  Home Assistive Devices / Equipment Home Assistive Devices/Equipment: Dentures (specify type),Cane (specify quad or straight),Eyeglasses Home Equipment: Walker - 2 wheels,Cane - single point,Bedside commode,Shower seat  Prior Device Use: Indicate devices/aids used by the patient prior to current illness, exacerbation or injury? None of the above  Current Functional Level Cognition  Overall Cognitive Status: Within Functional Limits for tasks assessed Orientation Level: (P) Oriented X4    Extremity Assessment (includes Sensation/Coordination)  Upper Extremity Assessment: Overall WFL for tasks assessed  Lower Extremity Assessment: Overall WFL for tasks assessed,RLE deficits/detail RLE Deficits / Details: s/p BKA    ADLs  Overall ADL's : Needs assistance/impaired Eating/Feeding: Set up,Sitting Grooming: Minimal assistance,Standing Grooming Details (indicate cue type and reason): minA for balance in standing Upper Body Bathing: Set up,Sitting Lower Body Bathing: Moderate assistance,Sitting/lateral leans,Sit to/from stand,Cueing for safety Upper Body Dressing : Set up,Sitting Lower Body Dressing: Moderate assistance,Sit to/from stand,Cueing for safety,Sitting/lateral leans Toilet Transfer: Minimal assistance,Ambulation,BSC,Grab bars,RW Toilet Transfer Details (indicate cue type and reason): cues for hand placement Toileting- Clothing Manipulation and Hygiene: Moderate assistance,Cueing for safety,Sitting/lateral lean,Sit to/from stand Functional mobility during ADLs: Min guard,Rolling walker,Cueing for sequencing,Cueing for safety General ADL Comments: Pt limited by severe pain in RLE, decreased strength, decreased caregiver assist at home and decreased safe mobility. Pt able to hop in room and in hallway  <100' with RW. Pt requiring cues to pick LLE up off of ground  rather than sliding it when hopping. Pt education for fall risk prevention strategies    Mobility  Overal bed mobility: Needs Assistance Bed Mobility: Supine to Sit Supine to sit: Supervision  General bed mobility comments: Supervision for safety.    Transfers  Overall transfer level: Needs assistance Equipment used: Rolling walker (2 wheeled) Transfers: Sit to/from [employer] Sit to Stand: Min assist Stand pivot transfers: Min assist General transfer comment: MinA for steadying assist to stand. Cues for hand placement. Min A for steadying to transfer to chair. Further mobility deferred as family present and talking to pt about his wife's prognosis.    Ambulation / Gait / Stairs / Wheelchair Mobility  Ambulation/Gait Ambulation/Gait assistance: Herbalist (Feet): 60 Feet Assistive device: Rolling walker (2 wheeled) Gait Pattern/deviations: Step-to pattern General Gait Details: Hop to pattern, cues for rolling walker rather than picking it up, increased left foot clearance Gait velocity: decreased    Posture / Balance Balance Overall balance assessment: Needs assistance Sitting-balance support: No upper extremity supported,Feet supported Sitting balance-Leahy Scale: Good Standing balance support: During functional activity,No upper extremity supported Standing balance-Leahy Scale: Poor Standing balance comment: Reliant on BUE and external support    Special needs/care consideration Wound Vac RLE, Skin R BKA and Diabetic management yes   Previous Home Environment (from acute therapy documentation) Living Arrangements: Spouse/significant other Available Help at Discharge: Family,Friend(s),Available PRN/intermittently Type of Home: House Home Layout: One level Home Access: Level entry Bathroom Shower/Tub: Multimedia programmer: Standard Bathroom Accessibility: Yes Additional Comments: wife unable to physically assist; pt assists wife as  needed, drives her to HD.  Discharge Living Setting Plans for Discharge Living Setting: Patient's home Type of Home at Discharge: House Discharge Home Layout: One level (basement apartment) Discharge Home Access: Level entry Discharge Bathroom Shower/Tub: Walk-in shower Discharge Bathroom Toilet: Handicapped height Discharge Bathroom Accessibility:  (has a step to enter bathroom area) Does the patient have any problems obtaining your medications?: No  Social/Family/Support Systems Patient Roles: Spouse Anticipated Caregiver: daughter Dominic Pea (and 2 grand daughters) Anticipated Caregiver's Contact Information: [name] [phone] Ability/Limitations of Caregiver: n/a Caregiver Availability: 24/7 Discharge Plan Discussed with Primary Caregiver: Yes Is Caregiver In Agreement with Plan?: Yes Does Caregiver/Family have Issues with Lodging/Transportation while Pt is in Rehab?: No  Goals Patient/Family Goal for Rehab: PT/OT supervision to mod I, SLP n/a Expected length of stay: 12-16 days Additional Information: wife in ICU at time of admit Pt/Family Agrees to Admission and willing to participate: Yes Program Orientation Provided & Reviewed with Pt/Caregiver Including Roles  & Responsibilities: Yes  Decrease burden of Care through IP rehab admission: n/a  Possible need for SNF placement upon discharge: Not anticipated.   Patient Condition: I have reviewed medical records from [facility], spoken with CM, and patient and daughter. I met with patient at the bedside for inpatient rehabilitation assessment.  Patient will benefit from ongoing PT and OT, can actively participate in 3 hours of therapy a day 5 days of the week, and can make measurable gains during the admission.  Patient will also benefit from the coordinated team approach during an Inpatient Acute Rehabilitation admission.  The patient will receive intensive therapy as well as Rehabilitation physician, nursing, social worker, and  care management interventions.  Due to safety, skin/wound care, disease management, medication administration, pain management and patient education the patient requires 24 hour a day rehabilitation nursing.  The patient is currently min assist with mobility and basic ADLs.  Discharge setting and therapy post discharge at home with home health is anticipated.  Patient has agreed to participate in the Acute Inpatient Rehabilitation Program and will admit today.  Preadmission Screen Completed By:  Michel Santee, PT, DPT 12/16/2020 12:00 PM ______________________________________________________________________   Discussed status with Dr. Dagoberto Ligas on 12/16/20  at 12:13 PM  and received approval for admission today.  Admission Coordinator:  Michel Santee, PT, DPT time 12:13 PM Sudie Grumbling 12/16/20    Assessment/Plan: Diagnosis: 1. Does the need for close, 24 hr/day Medical supervision in concert with the patient's rehab needs make it unreasonable for this patient to be served in a less intensive setting? Yes 2. Co-Morbidities requiring supervision/potential complications: HTN, CAD, multiple orthopedic surgeries, new R BKA with wound VAC 3. Due to bladder management, bowel management, safety, skin/wound care, disease management, medication administration, pain management and patient education, does the patient require 24 hr/day rehab nursing? Yes 4. Does the patient require coordinated care of a physician, rehab nurse, PT, OT, and SLP to address physical and functional deficits in the context of the above medical diagnosis(es)? Yes Addressing deficits in the following areas: balance, endurance, locomotion, strength, transferring, bathing, dressing, feeding, grooming and toileting 5. Can the patient actively participate in an intensive therapy program of at least 3 hrs of therapy 5 days a week? Yes 6. The potential for patient to make measurable gains while on inpatient rehab is good 7. Anticipated  functional outcomes upon discharge from inpatient rehab: modified independent and supervision PT, modified independent and supervision OT, n/a SLP 8. Estimated rehab length of stay to reach the above functional goals is: 12-16 days 9. Anticipated discharge destination: Home 10. Overall Rehab/Functional Prognosis: good   MD Signature:

## 2020-12-16 NOTE — H&P (Addendum)
Physical Medicine and Rehabilitation Admission H&P    CC: Functional deficits due to R-BKA   HPI: Keith Espinoza. 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 He also reported recent fall as well as numbness in left hand felt to be due to nerve injury.  He was admitted on 12/12/20 for R-BKA by Dr. Sharol Given. Post op had issues with pain control, acute renal failure with rise in SCr to 1.4, hyponatremia as well as elevated BS up to 400's. Therapy initiated and patient noted to have limitations in mobility due to R-BKA, pain and weakness affecting ADLs and mobility. CIR recommended due to functional decline.    Review of Systems  Constitutional: Negative for fever and weight loss.  HENT: Negative for hearing loss and nosebleeds.   Eyes: Negative.   Respiratory: Negative for cough and shortness of breath.   Cardiovascular: Negative for chest pain, orthopnea and leg swelling.  Gastrointestinal: Positive for constipation.  Genitourinary: Negative.   Musculoskeletal: Positive for joint pain.  Skin: Negative.   Neurological: Positive for tingling. Negative for focal weakness and headaches.  Psychiatric/Behavioral: The patient has insomnia. The patient is not nervous/anxious.   All other systems reviewed and are negative.     Past Medical History:  Diagnosis Date  . Allergic rhinitis   . Allergic rhinitis   . Arthritis   . Basal cell carcinoma 11/01/2019    bcc left chest treatment [place] [id number]   . Chronic leg pain    right  . Chronic lower back pain   . Coronary artery disease    a. Stenting to RCA 2004; staged DES to LAD and Cx 2004. DES to mRCA 2012. b. DES to mCx, PTCA to dCx 11/2011. c. Lateral wall MI 2013 s/p PTCA to distal Cx & DES to mid  OM2 11/2011. d. Low risk nuc 04/2014, EF wnl.  . Diabetes mellitus    Insulin dependent  . Diabetic neuropathy (HCC)    MILD  . Diverticulosis   . Dysrhythmia   . Gilbert syndrome   . Gout    right wrist; right foot; right elbow; have had it since 1970's  . H/O hiatal hernia   . Heart murmur   . History of echocardiogram    aortic sclerosis per echo 12/09 EF 65%, otherwise normal  . History of hemorrhoids    BLEEDING  . History of kidney stones    h/o  . Hypertension    Diagnosed 1995   . Myocardial infarction ([place])   . Pancreatic pseudocyst    a. s/p remote drainage 2006.  Marland Kitchen Thrombocytopenia ([place])    Seen on oldest labs in system from 2004  . Vitamin B 12 deficiency    orally replaced   Past Surgical History:  Procedure Laterality Date  . ABDOMINAL AORTOGRAM W/LOWER EXTREMITY Bilateral 08/08/2020   Procedure: ABDOMINAL AORTOGRAM W/LOWER EXTREMITY;  Surgeon: Angelia Mould, MD;  Location: [facility];  Service: Cardiovascular;  Laterality: Bilateral;  . AMPUTATION Right 12/12/2020   Procedure: RIGHT BELOW KNEE AMPUTATION;  Surgeon: [name], MD;  Location: [place] [facility];  Service: Orthopedics;  Laterality: Right;  . BACK SURGERY     "total of 3 times" S/P fall   . CARPAL TUNNEL RELEASE Bilateral   .  CHOLECYSTECTOMY  1990's  . COLONOSCOPY    . CORONARY ANGIOPLASTY  11/11/11  . CORONARY ANGIOPLASTY WITH STENT PLACEMENT  09/30/2011   "1 then; makes a total of 4"  . CORONARY ANGIOPLASTY WITH STENT PLACEMENT  11/11/11   "1; makes a total of 5"  . INGUINAL HERNIA REPAIR  2003   right  . JOINT REPLACEMENT Right 04/03/2002   hip replacment  . KNEE ARTHROSCOPY  1990's   left  . LEFT HEART CATHETERIZATION WITH CORONARY ANGIOGRAM N/A 09/30/2011   Procedure: LEFT HEART CATHETERIZATION WITH CORONARY ANGIOGRAM;  Surgeon: Jettie Booze, MD;  Location: [facility] CATH LAB;  Service: Cardiovascular;  Laterality: N/A;  possible PCI  . LEFT HEART CATHETERIZATION WITH CORONARY  ANGIOGRAM N/A 11/15/2011   Procedure: LEFT HEART CATHETERIZATION WITH CORONARY ANGIOGRAM;  Surgeon: Jettie Booze, MD;  Location: [facility] CATH LAB;  Service: Cardiovascular;  Laterality: N/A;  . PERCUTANEOUS CORONARY STENT INTERVENTION (PCI-S)  09/30/2011   Procedure: PERCUTANEOUS CORONARY STENT INTERVENTION (PCI-S);  Surgeon: Jettie Booze, MD;  Location: [facility] CATH LAB;  Service: Cardiovascular;;  . PERCUTANEOUS CORONARY STENT INTERVENTION (PCI-S) N/A 11/11/2011   Procedure: PERCUTANEOUS CORONARY STENT INTERVENTION (PCI-S);  Surgeon: Jettie Booze, MD;  Location: [facility] CATH LAB;  Service: Cardiovascular;  Laterality: N/A;  . PERIPHERAL VASCULAR BALLOON ANGIOPLASTY Right 08/08/2020   Procedure: PERIPHERAL VASCULAR BALLOON ANGIOPLASTY;  Surgeon: Angelia Mould, MD;  Location: [place] [facility];  Service: Cardiovascular;  Laterality: Right;  Posterior tibial   . SHOULDER SURGERY Right    X 2  . TONSILLECTOMY  ~ 1948  . TOTAL HIP REVISION Right 04/13/2019   Procedure: RIGHT TOTAL HIP REVISION-POSTERIOR  APPROACH LATERAL;  Surgeon: Marybelle Killings, MD;  Location: [place];  Service: Orthopedics;  Laterality: Right;  . TOTAL KNEE ARTHROPLASTY Left 07/23/2016   Procedure: LEFT TOTAL KNEE ARTHROPLASTY;  Surgeon: Marybelle Killings, MD;  Location: [place];  Service: Orthopedics;  Laterality: Left;    Family History  Problem Relation Age of Onset  . Diabetes Mother   . Hyperlipidemia Mother   . Hypertension Mother   . Cancer Father   . Hypertension Father   . Diabetes Sister   . Hypertension Sister   . Cancer Brother   . Heart attack Neg Hx     Social History:  Married--he used a cane PTA. Wife in ICU currently. Raises Hay and has an acre of garden in the summer. He used to smoke cigarettes --about a pack/week. He reports that he has quit smoking. His smokeless tobacco use includes chew all the time till 13 years ago--now a pack last 2 days.  He reports that he does not drink alcohol and does  not use drugs.    Allergies  Allergen Reactions  . Simvastatin Other (See Comments)    SEVERE MYALGIAS   . Zetia [Ezetimibe] Other (See Comments)    MYALGIAS  . Dilaudid [Hydromorphone Hcl] Other (See Comments)    hallucination    Medications Prior to Admission  Medication Sig Dispense Refill  . allopurinol (ZYLOPRIM) 300 MG tablet Take 300 mg by mouth daily.    Marland Kitchen amLODipine (NORVASC) 5 MG tablet Take 5 mg by mouth daily.    Marland Kitchen aspirin EC 81 MG tablet Take 81 mg by mouth daily.    . Cholecalciferol (VITAMIN D3) 50 MCG (2000 UT) TABS Take 2,000 Units by mouth daily.     . clopidogrel (PLAVIX) 75 MG tablet TAKE ONE TABLET BY MOUTH ONE TIME DAILY (  Patient taking differently: Take 75 mg by mouth daily.) 30 tablet 5  . Cyanocobalamin (B-12) 2500 MCG TABS Take 2,500 mcg by mouth daily.    . empagliflozin (JARDIANCE) 25 MG TABS tablet Take 25 mg by mouth daily.    Marland Kitchen gabapentin (NEURONTIN) 300 MG capsule Take 300 mg by mouth 2 (two) times daily.  3  . hydrochlorothiazide (HYDRODIURIL) 12.5 MG tablet Take 12.5 mg by mouth daily.    . insulin detemir (LEVEMIR) 100 UNIT/ML injection Inject 12-20 Units into the skin daily as needed (blood sugar above 150).    . isosorbide mononitrate (IMDUR) 30 MG 24 hr tablet Take 1 tablet (30 mg total) by mouth daily. Please schedule yearly appointment for future refills. Thank you 90 tablet 0  . KLOR-CON M20 20 MEQ tablet Take 20 mEq by mouth daily.   3  . losartan (COZAAR) 50 MG tablet Take 50 mg by mouth daily.  2  . metFORMIN (GLUCOPHAGE) 1000 MG tablet Take 1 tablet (1,000 mg total) by mouth 2 (two) times daily.    Marland Kitchen terazosin (HYTRIN) 5 MG capsule Take 5 mg by mouth at bedtime.    . nitroGLYCERIN (NITROSTAT) 0.4 MG SL tablet Place 0.4 mg under the tongue every 5 (five) minutes x 3 doses as needed for chest pain.     . ONE TOUCH ULTRA TEST test strip CHECK BLOOD SUGAR ONCE DAILY AS DIRECTED  5    Drug Regimen Review  Drug regimen was reviewed and  remains appropriate with no significant issues identified  Home:     Functional History:    Functional Status:  Mobility:          ADL:    Cognition: Cognition Orientation Level: Oriented X4     Blood pressure (!) 125/94, pulse 75, temperature 98.8 F (37.1 C), temperature source Oral, resp. rate 18, height 5\' 10"  (1.778 m), weight 67.4 kg, SpO2 99 %. Physical Exam Vitals and nursing note reviewed. Exam conducted with a chaperone present.  Constitutional:      Appearance: Normal appearance.     Comments: Pt sitting up at EOB with PT; working on ROM, appropriate, joking, NAD  HENT:     Head: Normocephalic.     Comments: Scratch on L cheek Smile equal    Right Ear: External ear normal.     Left Ear: External ear normal.     Nose: Nose normal. No congestion.     Mouth/Throat:     Mouth: Mucous membranes are dry.     Pharynx: Oropharynx is clear. No oropharyngeal exudate.  Eyes:     General:        Right eye: No discharge.        Left eye: No discharge.     Extraocular Movements: Extraocular movements intact.  Cardiovascular:     Rate and Rhythm: Normal rate and regular rhythm.     Heart sounds: Normal heart sounds. No murmur heard. No gallop.   Pulmonary:     Effort: Pulmonary effort is normal.     Breath sounds: Normal breath sounds.     Comments: CTA B/L- no W/R/R- good air movement  Abdominal:     Comments: Soft, NT, ND, hypoactive BS; abd flat  Musculoskeletal:     Cervical back: Normal range of motion and neck supple.     Comments: R-BKA with wound VAC in place. LLE without edema or breakdown.  UEs 5/5 in biceps, triceps, WE grip and finger abd B/L LLE- 5/5 in  HF, KE, DF and PF RLE- HF/KE/ 5/5- however lacking 15-20 degrees  Of R knee extension; can get to 95 degrees knee flexion  Skin:    Comments: Wound VAC on R BKA- no drainage- vacuum in place No buttock wounds IV in L forearm- looks OK  Neurological:     Mental Status: He is alert and  oriented to person, place, and time.     Comments: Light touch intact x 3+ extremities   Psychiatric:     Comments: Joking, appropriate- but sad     Results for orders placed or performed during the hospital encounter of 12/16/20 (from the past 48 hour(s))  Glucose, capillary     Status: Abnormal   Collection Time: 12/16/20  4:23 PM  Result Value Ref Range   Glucose-Capillary 165 (H) 70 - 99 mg/dL    Comment: Glucose reference range applies only to samples taken after fasting for at least 8 hours.   No results found.     Medical Problem List and Plan: 1.  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  Antithrombotics: -DVT/anticoagulation:  Pharmaceutical: Lovenox  -antiplatelet therapy: ASA/Plavix 3. Pain Management:  Will increase gabapentin to tid to help with phantom pain  --continue  Oxycodone prn. Will add OxyContin bid for more consistent relief--not getting pain meds on time.   - d/c IV Morphine since coming to CIR 4. Mood: LCSW to follow for evaluation and support.   -antipsychotic agents: N/A 5. Neuropsych: This patient is capable of making decisions on his own behalf. 6. Skin/Wound Care: continue wound VAC. Will monitor wound for healing  7. Fluids/Electrolytes/Nutrition: Monitor I/O. Check lytes in am.  8. HTN: Monitor BP tid--on Norvasc, HCTZ and Cozaar.   9. CKD?: Monitor renal status with serial checks.  --K+ on higher end of normal. May need diet adjusted. Recheck labs in am.   --Baseline SCr1.2? 10. T2DM: Hgb A1C-7.7. Monitor BS ac/hs and use SSI for elevated BS. Titrate insulin as needed.   --Continue Jardiance, metformin (SCr borderline at 1.33), Levemir with 4 units tid ac for meal coverage.  11. Hyponatremia: Question due to acute on chronic renal failure. Recheck labs in am.  12. CAD s/p PTCA: Monitor  for symptoms with increase in activity.  --On DAPT, Imdur, Cozaar and Jardiance.  13. Constipation: Will add Miralax bid for acute on chronic constipation.  - might benefit from Sorbitol- LBM 4 days ago  Bary Leriche- PA-C 12/16/2020   I have personally performed a face to face diagnostic evaluation of this patient and formulated the key components of the plan.  Additionally, I have personally reviewed laboratory data, imaging studies, as well as relevant notes and concur with the physician assistant's documentation above.   The patient's status has not changed from the original H&P.  Any changes in documentation from the acute care chart have been noted above.     Courtney Heys, MD 12/16/2020

## 2020-12-16 NOTE — Progress Notes (Signed)
PMR Admission Coordinator Pre-Admission Assessment   Patient: Keith Espinoza is an 80 y.o., male MRN: [id number] DOB: [birth year] Height: '5\' 10"'  (177.8 cm) Weight: 72.6 kg   Insurance Information HMO:     PPO:      PCP:      IPA:      80/20:      OTHER:  PRIMARY: Medicare A and B      Policy#: [id number]      Subscriber: pt CM Name:       Phone#:      Fax#:  Pre-Cert#: verified Civil engineer, contracting:  Benefits:  Phone #:      Name:  Eff. Date: A and B 11/04/04     Deduct: $1556      Out of Pocket Max: n/a      Life Max: n/a CIR: 100%      SNF: 20 full days Outpatient: 80%     Co-Pay: 20% Home Health: 100%      Co-Pay:  DME: 80%     Co-Ins: 20% Providers: pt choice  SECONDARY: [employer]      Policy#: [id number]     Phone#:    Financial Counselor:        Phone#:    The "Data Collection Information Summary" for patients in Inpatient Rehabilitation Facilities with attached "Privacy Act Shafer Records" was provided and verbally reviewed with: Patient and Family   Emergency Contact Information         Contact Information     Name Relation Home Work [place] Spouse [phone]   [phone]    Keith Espinoza Daughter [phone]   [phone]         Current Medical History  Patient Admitting Diagnosis: BKA    History of Present Illness: Pt is a 80 y/o male with PMH of heart disease, multiple orthopedic surgeries, DM< and neuropathy admitted to [facility] on 12/12/20 with progressive ischemic pain in the RLE.  Pt underwent angioplasty with VVS, which was unsuccessful.  Pt presented with RLE dry gangrene and was recommended for R BKA, which he underwent with Dr. Sharol Given on 3/11. Post op course pain management.  Therapy evaluations were completed and pt was recommended for CIR.    Patient's medical record from [facility] has been reviewed by the rehabilitation admission coordinator and physician.   Past Medical History      Past Medical History:   Diagnosis Date  . Allergic rhinitis    . Allergic rhinitis    . Arthritis    . Basal cell carcinoma 11/01/2019     bcc left chest treatment [place] cx3 70f   . Chronic leg pain      right  . Chronic lower back pain    . Coronary artery disease      a. Stenting to RCA 2004; staged DES to LAD and Cx 2004. DES to mRCA 2012. b. DES to mCx, PTCA to dCx 11/2011. c. Lateral wall MI 2013 s/p PTCA to distal Cx & DES to mid OM2 11/2011. d. Low risk nuc 04/2014, EF wnl.  . Diabetes mellitus      Insulin dependent  . Diabetic neuropathy (HCC)      MILD  . Diverticulosis    . Dysrhythmia    . Gilbert syndrome    . Gout      right wrist; right foot; right elbow; have had it since 1970's  . H/O hiatal hernia    .  Heart murmur    . History of echocardiogram      aortic sclerosis per echo 12/09 EF 65%, otherwise normal  . History of hemorrhoids      BLEEDING  . History of kidney stones      h/o  . Hypertension      Diagnosed 1995   . Myocardial infarction ([place])    . Pancreatic pseudocyst      a. s/p remote drainage 2006.  Marland Kitchen Thrombocytopenia ([place])      Seen on oldest labs in system from 2004  . Vitamin B 12 deficiency      orally replaced      Family History   family history includes Cancer in his brother and father; Diabetes in his mother and sister; Hyperlipidemia in his mother; Hypertension in his father, mother, and sister.   Prior Rehab/Hospitalizations Has the patient had prior rehab or hospitalizations prior to admission? Yes   Has the patient had major surgery during 100 days prior to admission? Yes              Current Medications   Current Facility-Administered Medications:  .  0.9 %  sodium chloride infusion, , Intravenous, Continuous, [name], MD .  acetaminophen (TYLENOL) tablet 325-650 mg, 325-650 mg, Oral, Q6H PRN, [name], MD, 325 mg at 12/16/20 0058 .  allopurinol (ZYLOPRIM) tablet 300 mg, 300 mg, Oral, Daily, [name], MD, 300 mg at 12/16/20 0943 .   amLODipine (NORVASC) tablet 5 mg, 5 mg, Oral, Daily, [name], MD, 5 mg at 12/16/20 0943 .  aspirin EC tablet 81 mg, 81 mg, Oral, Daily, [name], MD, 81 mg at 12/16/20 0943 .  bisacodyl (DULCOLAX) suppository 10 mg, 10 mg, Rectal, Daily PRN, [name], MD .  cholecalciferol (VITAMIN D) tablet 2,000 Units, 2,000 Units, Oral, Daily, [name], MD, 2,000 Units at 12/16/20 [phone] .  clopidogrel (PLAVIX) tablet 75 mg, 75 mg, Oral, Daily, [name], MD, 75 mg at 12/16/20 0943 .  docusate sodium (COLACE) capsule 100 mg, 100 mg, Oral, BID, [name], MD, 100 mg at 12/16/20 0942 .  empagliflozin (JARDIANCE) tablet 25 mg, 25 mg, Oral, Daily, [name], MD, 25 mg at 12/16/20 0946 .  gabapentin (NEURONTIN) capsule 300 mg, 300 mg, Oral, BID, [name], MD, 300 mg at 12/16/20 0943 .  hydrochlorothiazide (HYDRODIURIL) tablet 12.5 mg, 12.5 mg, Oral, Daily, [name], MD, 12.5 mg at 12/16/20 0945 .  insulin aspart (novoLOG) injection 0-15 Units, 0-15 Units, Subcutaneous, TID WC, [name], MD, 2 Units at 12/16/20 0749 .  insulin aspart (novoLOG) injection 4 Units, 4 Units, Subcutaneous, TID WC, [name], MD, 4 Units at 12/16/20 [id number] .  insulin detemir (LEVEMIR) injection 10 Units, 10 Units, Subcutaneous, QHS, [name], MD, 10 Units at 12/15/20 2127 .  isosorbide mononitrate (IMDUR) 24 hr tablet 30 mg, 30 mg, Oral, Daily, [name], MD, 30 mg at 12/16/20 0943 .  losartan (COZAAR) tablet 50 mg, 50 mg, Oral, Daily, [name], MD, 50 mg at 12/16/20 0942 .  magnesium citrate solution 1 Bottle, 1 Bottle, Oral, Once PRN, [name], MD .  metFORMIN (GLUCOPHAGE) tablet 1,000 mg, 1,000 mg, Oral, BID WC, [name], MD, 1,000 mg at 12/16/20 0747 .  methocarbamol (ROBAXIN) tablet 500 mg, 500 mg, Oral, Q6H PRN, 500 mg at 12/16/20 0700 **OR** methocarbamol (ROBAXIN) 500 mg in dextrose 5 %  50 mL IVPB, 500 mg, Intravenous, Q6H PRN, [name],  MD .  metoCLOPramide (REGLAN) tablet 5-10 mg, 5-10 mg, Oral, Q8H PRN **OR** metoCLOPramide (REGLAN) injection 5-10 mg, 5-10 mg, Intravenous, Q8H PRN, [name], MD .  morphine 2 MG/ML injection 0.5-1 mg, 0.5-1 mg, Intravenous, Q2H PRN, [name], MD, 1 mg at 12/15/20 1532 .  nitroGLYCERIN (NITROSTAT) SL tablet 0.4 mg, 0.4 mg, Sublingual, Q5 Min x 3 PRN, [name], MD .  ondansetron (ZOFRAN) tablet 4 mg, 4 mg, Oral, Q6H PRN **OR** ondansetron (ZOFRAN) injection 4 mg, 4 mg, Intravenous, Q6H PRN, [name], MD .  oxyCODONE (Oxy IR/ROXICODONE) immediate release tablet 5-10 mg, 5-10 mg, Oral, Q4H PRN, Meredith Pel, MD, 10 mg at 12/16/20 0747 .  polyethylene glycol (MIRALAX / GLYCOLAX) packet 17 g, 17 g, Oral, Daily PRN, [name], MD .  terazosin (HYTRIN) capsule 5 mg, 5 mg, Oral, QHS, [name], MD, 5 mg at 12/15/20 2126 .  vitamin B-12 (CYANOCOBALAMIN) tablet 2,500 mcg, 2,500 mcg, Oral, Daily, [name], MD, 2,500 mcg at 12/16/20 8546   Patients Current Diet:     Diet Order                      Diet - low sodium heart healthy              Diet Carb Modified Fluid consistency: Thin; Room service appropriate? Yes  Diet effective now                      Precautions / Restrictions Precautions Precautions: Fall,Other (comment) Precaution Comments: wound vac Other Brace: stump shrinker/shaper RLE Restrictions Weight Bearing Restrictions: (P) Yes RLE Weight Bearing: Non weight bearing    Has the patient had 2 or more falls or a fall with injury in the past year? No   Prior Activity Level Community (5-7x/wk): no DME at baseline, worked in the yard/gardens, drove wife to HD 3x/week   Prior Functional Level Self Care: Did the patient need help bathing, dressing, using the toilet or eating? Independent   Indoor Mobility: Did the patient need assistance with walking from room to room (with or without device)? Independent   Stairs: Did the patient  need assistance with internal or external stairs (with or without device)? Independent   Functional Cognition: Did the patient need help planning regular tasks such as shopping or remembering to take medications? Independent   Home Assistive Devices / Equipment Home Assistive Devices/Equipment: Dentures (specify type),Cane (specify quad or straight),Eyeglasses Home Equipment: Walker - 2 wheels,Cane - single point,Bedside commode,Shower seat   Prior Device Use: Indicate devices/aids used by the patient prior to current illness, exacerbation or injury? None of the above   Current Functional Level Cognition   Overall Cognitive Status: Within Functional Limits for tasks assessed Orientation Level: (P) Oriented X4    Extremity Assessment (includes Sensation/Coordination)   Upper Extremity Assessment: Overall WFL for tasks assessed  Lower Extremity Assessment: Overall WFL for tasks assessed,RLE deficits/detail RLE Deficits / Details: s/p BKA     ADLs   Overall ADL's : Needs assistance/impaired Eating/Feeding: Set up,Sitting Grooming: Minimal assistance,Standing Grooming Details (indicate cue type and reason): minA for balance in standing Upper Body Bathing: Set up,Sitting Lower Body Bathing: Moderate assistance,Sitting/lateral leans,Sit to/from stand,Cueing for safety Upper Body Dressing : Set up,Sitting Lower Body Dressing: Moderate assistance,Sit to/from stand,Cueing for safety,Sitting/lateral leans Toilet Transfer: Minimal assistance,Ambulation,BSC,Grab bars,RW Toilet  Transfer Details (indicate cue type and reason): cues for hand placement Toileting- Clothing Manipulation and Hygiene: Moderate assistance,Cueing for safety,Sitting/lateral lean,Sit to/from stand Functional mobility during ADLs: Min guard,Rolling walker,Cueing for sequencing,Cueing for safety General ADL Comments: Pt limited by severe pain in RLE, decreased strength, decreased caregiver assist at home and decreased safe  mobility. Pt able to hop in room and in hallway  <100' with RW. Pt requiring cues to pick LLE up off of ground rather than sliding it when hopping. Pt education for fall risk prevention strategies     Mobility   Overal bed mobility: Needs Assistance Bed Mobility: Supine to Sit Supine to sit: Supervision General bed mobility comments: Supervision for safety.     Transfers   Overall transfer level: Needs assistance Equipment used: Rolling walker (2 wheeled) Transfers: Sit to/from [employer] Sit to Stand: Min assist Stand pivot transfers: Min assist General transfer comment: MinA for steadying assist to stand. Cues for hand placement. Min A for steadying to transfer to chair. Further mobility deferred as family present and talking to pt about his wife's prognosis.     Ambulation / Gait / Stairs / Wheelchair Mobility   Ambulation/Gait Ambulation/Gait assistance: Herbalist (Feet): 60 Feet Assistive device: Rolling walker (2 wheeled) Gait Pattern/deviations: Step-to pattern General Gait Details: Hop to pattern, cues for rolling walker rather than picking it up, increased left foot clearance Gait velocity: decreased     Posture / Balance Balance Overall balance assessment: Needs assistance Sitting-balance support: No upper extremity supported,Feet supported Sitting balance-Leahy Scale: Good Standing balance support: During functional activity,No upper extremity supported Standing balance-Leahy Scale: Poor Standing balance comment: Reliant on BUE and external support     Special needs/care consideration Wound Vac RLE, Skin R BKA and Diabetic management yes    Previous Home Environment (from acute therapy documentation) Living Arrangements: Spouse/significant other Available Help at Discharge: Family,Friend(s),Available PRN/intermittently Type of Home: House Home Layout: One level Home Access: Level entry Bathroom Shower/Tub: Tourist information centre manager: Standard Bathroom Accessibility: Yes Additional Comments: wife unable to physically assist; pt assists wife as needed, drives her to HD.   Discharge Living Setting Plans for Discharge Living Setting: Patient's home Type of Home at Discharge: House Discharge Home Layout: One level (basement apartment) Discharge Home Access: Level entry Discharge Bathroom Shower/Tub: Walk-in shower Discharge Bathroom Toilet: Handicapped height Discharge Bathroom Accessibility:  (has a step to enter bathroom area) Does the patient have any problems obtaining your medications?: No   Social/Family/Support Systems Patient Roles: Spouse Anticipated Caregiver: daughter Keith Espinoza (and 2 grand daughters) Anticipated Caregiver's Contact Information: [name] [phone] Ability/Limitations of Caregiver: n/a Caregiver Availability: 24/7 Discharge Plan Discussed with Primary Caregiver: Yes Is Caregiver In Agreement with Plan?: Yes Does Caregiver/Family have Issues with Lodging/Transportation while Pt is in Rehab?: No   Goals Patient/Family Goal for Rehab: PT/OT supervision to mod I, SLP n/a Expected length of stay: 12-16 days Additional Information: wife in ICU at time of admit Pt/Family Agrees to Admission and willing to participate: Yes Program Orientation Provided & Reviewed with Pt/Caregiver Including Roles  & Responsibilities: Yes   Decrease burden of Care through IP rehab admission: n/a  Possible need for SNF placement upon discharge: Not anticipated.    Patient Condition: I have reviewed medical records from [facility], spoken with CM, and patient and daughter. I met with patient at the bedside for inpatient rehabilitation assessment.  Patient will benefit from ongoing PT and OT, can actively participate in 3  hours of therapy a day 5 days of the week, and can make measurable gains during the admission.  Patient will also benefit from the coordinated team approach during an Inpatient Acute  Rehabilitation admission.  The patient will receive intensive therapy as well as Rehabilitation physician, nursing, social worker, and care management interventions.  Due to safety, skin/wound care, disease management, medication administration, pain management and patient education the patient requires 24 hour a day rehabilitation nursing.  The patient is currently min assist with mobility and basic ADLs.  Discharge setting and therapy post discharge at home with home health is anticipated.  Patient has agreed to participate in the Acute Inpatient Rehabilitation Program and will admit today.   Preadmission Screen Completed By:  Michel Santee, PT, DPT 12/16/2020 12:00 PM ______________________________________________________________________   Discussed status with Dr. Dagoberto Ligas on 12/16/20  at 12:13 PM  and received approval for admission today.   Admission Coordinator:  Michel Santee, PT, DPT time 12:13 PM Sudie Grumbling 12/16/20     Assessment/Plan: Diagnosis: 1. Does the need for close, 24 hr/day Medical supervision in concert with the patient's rehab needs make it unreasonable for this patient to be served in a less intensive setting? Yes 2. Co-Morbidities requiring supervision/potential complications: HTN, CAD, multiple orthopedic surgeries, new R BKA with wound VAC 3. Due to bladder management, bowel management, safety, skin/wound care, disease management, medication administration, pain management and patient education, does the patient require 24 hr/day rehab nursing? Yes 4. Does the patient require coordinated care of a physician, rehab nurse, PT, OT, and SLP to address physical and functional deficits in the context of the above medical diagnosis(es)? Yes Addressing deficits in the following areas: balance, endurance, locomotion, strength, transferring, bathing, dressing, feeding, grooming and toileting 5. Can the patient actively participate in an intensive therapy program of at least 3 hrs of  therapy 5 days a week? Yes 6. The potential for patient to make measurable gains while on inpatient rehab is good 7. Anticipated functional outcomes upon discharge from inpatient rehab: modified independent and supervision PT, modified independent and supervision OT, n/a SLP 8. Estimated rehab length of stay to reach the above functional goals is: 12-16 days 9. Anticipated discharge destination: Home 10. Overall Rehab/Functional Prognosis: good     MD Signature:

## 2020-12-17 LAB — COMPREHENSIVE METABOLIC PANEL
ALT: 20 U/L (ref 0–44)
AST: 46 U/L — ABNORMAL HIGH (ref 15–41)
Albumin: 2.9 g/dL — ABNORMAL LOW (ref 3.5–5.0)
Alkaline Phosphatase: 66 U/L (ref 38–126)
Anion gap: 10 (ref 5–15)
BUN: 26 mg/dL — ABNORMAL HIGH (ref 8–23)
CO2: 29 mmol/L (ref 22–32)
Calcium: 9.6 mg/dL (ref 8.9–10.3)
Chloride: 95 mmol/L — ABNORMAL LOW (ref 98–111)
Creatinine, Ser: 1.09 mg/dL (ref 0.61–1.24)
GFR, Estimated: >60 mL/min (ref >60)
Glucose, Bld: 127 mg/dL — ABNORMAL HIGH (ref 70–99)
Potassium: 3.2 mmol/L — ABNORMAL LOW (ref 3.5–5.1)
Sodium: 134 mmol/L — ABNORMAL LOW (ref 135–145)
Total Bilirubin: 0.9 mg/dL (ref 0.3–1.2)
Total Protein: 6.1 g/dL — ABNORMAL LOW (ref 6.5–8.1)

## 2020-12-17 LAB — GLUCOSE, CAPILLARY
Glucose-Capillary: 170 mg/dL — ABNORMAL HIGH (ref 70–99)
Glucose-Capillary: 61 mg/dL — ABNORMAL LOW (ref 70–99)
Glucose-Capillary: 123 mg/dL — ABNORMAL HIGH (ref 70–99)
Glucose-Capillary: 214 mg/dL — ABNORMAL HIGH (ref 70–99)
Glucose-Capillary: 164 mg/dL — ABNORMAL HIGH (ref 70–99)

## 2020-12-17 LAB — CBC WITH DIFFERENTIAL/PLATELET
Abs Immature Granulocytes: 0.05 10*3/uL (ref 0.00–0.07)
Basophils Absolute: 0 10*3/uL (ref 0.0–0.1)
Basophils Relative: 0 %
Eosinophils Absolute: 0.2 10*3/uL (ref 0.0–0.5)
Eosinophils Relative: 3 %
HCT: 31.7 % — ABNORMAL LOW (ref 39.0–52.0)
Hemoglobin: 11.4 g/dL — ABNORMAL LOW (ref 13.0–17.0)
Immature Granulocytes: 1 %
Lymphocytes Relative: 15 %
Lymphs Abs: 1.3 10*3/uL (ref 0.7–4.0)
MCH: 31.2 pg (ref 26.0–34.0)
MCHC: 36 g/dL (ref 30.0–36.0)
MCV: 86.8 fL (ref 80.0–100.0)
Monocytes Absolute: 0.9 10*3/uL (ref 0.1–1.0)
Monocytes Relative: 10 %
Neutro Abs: 6.1 10*3/uL (ref 1.7–7.7)
Neutrophils Relative %: 71 %
Platelets: 179 10*3/uL (ref 150–400)
RBC: 3.65 MIL/uL — ABNORMAL LOW (ref 4.22–5.81)
RDW: 13.9 % (ref 11.5–15.5)
WBC: 8.6 10*3/uL (ref 4.0–10.5)
nRBC: 0 % (ref 0.0–0.2)

## 2020-12-17 MED ORDER — POTASSIUM CHLORIDE CRYS ER 20 MEQ PO TBCR
40.0000 meq | EXTENDED_RELEASE_TABLET | Freq: Two times a day (BID) | ORAL | Status: AC
  Administered 2020-12-17 (×2): 40 meq via ORAL
  Filled 2020-12-17 (×2): qty 2

## 2020-12-17 MED ORDER — INSULIN ASPART 100 UNIT/ML ~~LOC~~ SOLN
3.0000 [IU] | Freq: Three times a day (TID) | SUBCUTANEOUS | Status: DC
  Administered 2020-12-17 – 2020-12-27 (×23): 3 [IU] via SUBCUTANEOUS

## 2020-12-17 NOTE — Progress Notes (Signed)
PROGRESS NOTE   Subjective/Complaints: No complaints this morning Denies pain  Ambulated 45 feet with Lovena Le PT! Discussed wound vac will be removed 1 week from procedure.   ROS: Denies pain  Objective:   No results found. Recent Labs    12/17/20 0118  WBC 8.6  HGB 11.4*  HCT 31.7*  PLT 179   Recent Labs    12/17/20 0118  NA 134*  K 3.2*  CL 95*  CO2 29  GLUCOSE 127*  BUN 26*  CREATININE 1.09  CALCIUM 9.6    Intake/Output Summary (Last 24 hours) at 12/17/2020 0906 Last data filed at 12/17/2020 0300 Gross per 24 hour  Intake 120 ml  Output 850 ml  Net -730 ml        Physical Exam: Vital Signs Blood pressure 135/64, pulse 68, temperature 97.6 F (36.4 C), temperature source Oral, resp. rate 16, height 5\' 10"  (1.778 m), weight 67.4 kg, SpO2 97 %. Gen: no distress, normal appearing HEENT: oral mucosa pink and moist, NCAT Cardio: Reg rate Chest: normal effort, normal rate of breathing Abd: soft, non-distended Musculoskeletal:     Cervical back: Normal range of motion and neck supple.     Comments: R-BKA with wound VAC in place. LLE without edema or breakdown.  UEs 5/5 in biceps, triceps, WE grip and finger abd B/L LLE- 5/5 in HF, KE, DF and PF RLE- HF/KE/ 5/5- however lacking 15-20 degrees  Of R knee extension; can get to 95 degrees knee flexion  Skin:    Comments: Wound VAC on R BKA- no drainage- vacuum in place No buttock wounds IV in L forearm- looks OK  Neurological:     Mental Status: He is alert and oriented to person, place, and time.     Comments: Light touch intact x 3+ extremities   Psychiatric:     Comments: Joking, appropriate- but sad    Assessment/Plan: 1. Functional deficits which require 3+ hours per day of interdisciplinary therapy in a comprehensive inpatient rehab setting.  Physiatrist is providing close team supervision and 24 hour management of active medical problems  listed below.  Physiatrist and rehab team continue to assess barriers to discharge/monitor patient progress toward functional and medical goals  Care Tool:  Bathing              Bathing assist       Upper Body Dressing/Undressing Upper body dressing        Upper body assist      Lower Body Dressing/Undressing Lower body dressing            Lower body assist       Toileting Toileting    Toileting assist Assist for toileting: Independent with assistive device Assistive Device Comment: urinal   Transfers Chair/bed transfer  Transfers assist     Chair/bed transfer assist level: Moderate Assistance - Patient 50 - 74%     Locomotion Ambulation   Ambulation assist              Walk 10 feet activity   Assist           Walk 50 feet activity   Assist  Walk 150 feet activity   Assist           Walk 10 feet on uneven surface  activity   Assist           Wheelchair     Assist               Wheelchair 50 feet with 2 turns activity    Assist            Wheelchair 150 feet activity     Assist          Blood pressure 135/64, pulse 68, temperature 97.6 F (36.4 C), temperature source Oral, resp. rate 16, height 5\' 10"  (1.778 m), weight 67.4 kg, SpO2 97 %.  Medical Problem List and Plan: 1.  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  Antithrombotics: -DVT/anticoagulation:  Pharmaceutical: Lovenox             -antiplatelet therapy: ASA/Plavix 3. Pain Management:  Will increase gabapentin to tid to help with phantom pain             --continue  Oxycodone prn. Will add OxyContin bid for more consistent relief--not getting pain meds on time.              - d/c IV Morphine since coming to CIR 4.  Mood: LCSW to follow for evaluation and support.              -antipsychotic agents: N/A 5. Neuropsych: This patient is capable of making decisions on his own behalf. 6. Skin/Wound Care: continue wound VAC. Will monitor wound for healing. Discussed plan for d/c 1 week from surgery- 3/18 7. Fluids/Electrolytes/Nutrition: Monitor I/O. Check lytes in am.  8. HTN: Monitor BP tid--on Norvasc, HCTZ and Cozaar.   9. CKD?: Monitor renal status with serial checks.             --K+ on higher end of normal. May need diet adjusted. Recheck labs in am.              --Baseline SCr1.2? 10. T2DM: Hgb A1C-7.7. Monitor BS ac/hs and use SSI for elevated BS. Titrate insulin as needed.              --Continue Jardiance, metformin (SCr borderline at 1.33), Levemir with 4 units tid ac for meal coverage.  11. Hyponatremia: Question due to acute on chronic renal failure. Na 134 on 3/16, repeat tomorrow.  12. CAD s/p PTCA: Monitor for symptoms with increase in activity.             --On DAPT, Imdur, Cozaar and Jardiance.  13. Constipation: Will add Miralax bid for acute on chronic constipation.  - might benefit from Sorbitol- LBM 4 days ago 14. Hypokalemia: supplement 5meq BID today and repeat BMP tomorrow.  15. Anemia: Hgb 11.4 today, repeat Monday.    LOS: 1 days A FACE TO FACE EVALUATION WAS PERFORMED  Paola Flynt P Javion Holmer 12/17/2020, 9:06 AM

## 2020-12-17 NOTE — Evaluation (Signed)
Occupational Therapy Assessment and Plan  Patient Details  Name: Keith Espinoza MRN: [id number] Date of Birth: 13-Apr-1941  OT Diagnosis: abnormal posture, acute pain, ataxia, muscle weakness (generalized) and swelling of limb Rehab Potential:   ELOS: 7-10 days   Today's Date: 12/17/2020 OT Individual Time: 7116-5790 OT Individual Time Calculation (min): 54 min     Hospital Problem: Principal Problem:   Below-knee amputation of right lower extremity ([place])   Past Medical History:  Past Medical History:  Diagnosis Date  . Allergic rhinitis   . Allergic rhinitis   . Arthritis   . Basal cell carcinoma 11/01/2019    bcc left chest treatment [place] cx3 26f   . Chronic leg pain    right  . Chronic lower back pain   . Coronary artery disease    a. Stenting to RCA 2004; staged DES to LAD and Cx 2004. DES to mRCA 2012. b. DES to mCx, PTCA to dCx 11/2011. c. Lateral wall MI 2013 s/p PTCA to distal Cx & DES to mid OM2 11/2011. d. Low risk nuc 04/2014, EF wnl.  . Diabetes mellitus    Insulin dependent  . Diabetic neuropathy (HCC)    MILD  . Diverticulosis   . Dysrhythmia   . Gilbert syndrome   . Gout    right wrist; right foot; right elbow; have had it since 1970's  . H/O hiatal hernia   . Heart murmur   . History of echocardiogram    aortic sclerosis per echo 12/09 EF 65%, otherwise normal  . History of hemorrhoids    BLEEDING  . History of kidney stones    h/o  . Hypertension    Diagnosed 1995   . [place]ocardial infarction ([place]   . Pancreatic pseudocyst    a. s/p remote drainage 2006.  .[place]ocyt[place]    Seen on oldest labs in system from 2004  . Vitamin B 12 deficiency    orally replaced   Past Surgical History:  Past Surgical History:  Procedure Laterality Date  . ABDOMINAL AORTOGRAM W/LOWER EXTREMITY Bilateral 08/08/2020   Procedure: ABDOMINAL AORTOGRAM W/LOWER EXTREMITY;  Surgeon: DAngelia Mould MD;  [facility];  Service: Cardiovascular;   Laterality: Bilateral;  . AMPUTATION Right 12/12/2020   Procedure: RIGHT BELOW KNEE AMPUTATION;  Surgeon: DNewt Minion MD;  [place]  Service: Orthopedics;  Laterality: Right;  . BACK SURGERY     "total of 3 times" S/P fall   . CARPAL TUNNEL RELEASE Bilateral   . CHOLECYSTECTOMY  1990's  . COLONOSCOPY    . CORONARY ANGIOPLASTY  11/11/11  . CORONARY ANGIOPLASTY WITH STENT PLACEMENT  09/30/2011   "1 then; makes a total of 4"  . CORONARY ANGIOPLASTY WITH STENT PLACEMENT  11/11/11   "1; makes a total of 5"  . INGUINAL HERNIA REPAIR  2003   right  . JOINT REPLACEMENT Right 04/03/2002   hip replacment  . KNEE ARTHROSCOPY  1990's   left  . LEFT HEART CATHETERIZATION WITH CORONARY ANGIOGRAM N/A 09/30/2011   Procedure: LEFT HEART CATHETERIZATION WITH CORONARY ANGIOGRAM;  Surgeon: JJettie Booze MD;  [facility];  Service: Cardiovascular;  Laterality: N/A;  possible PCI  . LEFT HEART CATHETERIZATION WITH CORONARY ANGIOGRAM N/A 11/15/2011   Procedure: LEFT HEART CATHETERIZATION WITH CORONARY ANGIOGRAM;  Surgeon: JJettie Booze MD;  [facility];  Service: Cardiovascular;  Laterality: N/A;  . PERCUTANEOUS CORONARY STENT INTERVENTION (PCI-S)  09/30/2011   Procedure:  PERCUTANEOUS CORONARY STENT INTERVENTION (PCI-S);  Surgeon: Jettie Booze, MD;  Location[facility];  Service: Cardiovascular;;  . PERCUTANEOUS CORONARY STENT INTERVENTION (PCI-S) N/A 11/11/2011   Procedure: PERCUTANEOUS CORONARY STENT INTERVENTION (PCI-S);  Surgeon: Jettie Booze, MD;  Location: [facility] CATH LAB;  Service: Cardiovascular;  Laterality: N/A;  . PERIPHERAL VASCULAR BALLOON ANGIOPLASTY Right 08/08/2020   Procedure: PERIPHERAL VASCULAR BALLOON ANGIOPLASTY;  Surgeon: Angelia Mould, MD;  Location: [place] CV LAB;  Service: Cardiovascular;  Laterality: Right;  Posterior tibial   . SHOULDER SURGERY Right    X 2  . TONSILLECTOMY  ~ 1948  . TOTAL HIP REVISION Right 04/13/2019    Procedure: RIGHT TOTAL HIP REVISION-POSTERIOR  APPROACH LATERAL;  Surgeon: Marybelle Killings, MD;  Location: [place];  Service: Orthopedics;  Laterality: Right;  . TOTAL KNEE ARTHROPLASTY Left 07/23/2016   Procedure: LEFT TOTAL KNEE ARTHROPLASTY;  Surgeon: Marybelle Killings, MD;  Location: [place];  Service: Orthopedics;  Laterality: Left;    Assessment & Plan Clinical Impression: Keith Espinoza is an 80 year old male with history of CAD s/p PTCA, T2DM, Gilbert syndrome, chronic LBP, severe tibial artery occlusive disease s/p angioplasty, non-healing right foot wound with gangrenous changes, ascending cellulitis and increase in pain due to ischemia despite attempts at limb salvage. He also reported recent fall as well as numbness in left hand felt to be due to nerve injury.  He was admitted on 12/12/20 for L-BKA by Dr. Sharol Given. Post op had issues with pain control, acute renal failure with rise in SCr to 1.4, hyponatremia as well as elevated BS up to 400's. Therapy initiated and patient noted to have limitations in mobility due to R-BKA, pain and weakness affecting ADLs and mobility. CIR recommended due to functional decline.   Patient transferred to CIR on 12/16/2020 .    Patient currently requires min with basic self-care skills secondary to muscle weakness, decreased cardiorespiratoy endurance, decreased coordination and decreased sitting balance, decreased standing balance, decreased postural control and decreased balance strategies.  Prior to hospitalization, patient could complete BADL and IADL with independent .  Patient will benefit from skilled intervention to decrease level of assist with basic self-care skills, increase independence with basic self-care skills and increase level of independence with iADL prior to discharge home independently but will have family and friends PRN.  Anticipate patient will require intermittent supervision and follow up home health.  OT - End of Session Activity Tolerance:  Tolerates 30+ min activity with multiple rests Endurance Deficit: Yes Endurance Deficit Description: frequent rest breaks during functional activity also limited by pain OT Assessment OT Patient demonstrates impairments in the following area(s): Balance;Edema;Endurance;Motor;Pain;Safety;Sensory OT Basic ADL's Functional Problem(s): Grooming;Bathing;Dressing;Toileting OT Advanced ADL's Functional Problem(s): Simple Meal Preparation OT Transfers Functional Problem(s): Toilet;Tub/Shower OT Additional Impairment(s): None OT Plan OT Intensity: Minimum of 1-2 x/day, 45 to 90 minutes OT Frequency: 5 out of 7 days OT Duration/Estimated Length of Stay: 7-10 days OT Treatment/Interventions: Balance/vestibular training;Discharge planning;Pain management;Self Care/advanced ADL retraining;Therapeutic Activities;UE/LE Coordination activities;Visual/perceptual remediation/compensation;Therapeutic Exercise;Skin care/wound managment;Patient/family education;Functional mobility training;Disease mangement/prevention;Cognitive remediation/compensation;Community reintegration;DME/adaptive equipment instruction;Neuromuscular re-education;Psychosocial support;Splinting/orthotics;UE/LE Strength taining/ROM;Wheelchair propulsion/positioning OT Self Feeding Anticipated Outcome(s): no goal OT Basic Self-Care Anticipated Outcome(s): Mod I OT Toileting Anticipated Outcome(s): Mod I OT Bathroom Transfers Anticipated Outcome(s): Mod I OT Recommendation Patient destination: Home Follow Up Recommendations: Home health OT Equipment Recommended: To be determined Equipment Details: already has shower seat and BSC   OT Evaluation Precautions/Restrictions  Precautions Precautions: Fall Precaution Comments: R  BKA Required Braces or Orthoses: Other Brace Other Brace: residual limb guard if able to tolerate Restrictions Weight Bearing Restrictions: Yes RLE Weight Bearing: Non weight bearing Pain Pain Assessment Pain  Scale: 0-10 Pain Score: 7  Pain Type: Surgical pain Home Living/Prior Functioning Home Living Living Arrangements: Spouse/significant other Available Help at Discharge: Family,Friend(s),Available PRN/intermittently Type of Home: House Home Access: Level entry Home Layout: One level Bathroom Shower/Tub: Multimedia programmer: Standard Bathroom Accessibility: Yes Additional Comments: pt reports step up to get to bathroom, otherwise home is one level; assist upon d/c obtained from chart, pt primary caregiver for wife who is currently in ICU  Lives With: Spouse IADL History Homemaking Responsibilities: Yes Current License: Yes Prior Function Level of Independence: Independent with gait,Independent with transfers,Independent with homemaking with ambulation,Independent with basic ADLs  Able to Take Stairs?: Yes Driving: Yes Comments: would drive wife to HD Vision Baseline Vision/History: Wears glasses Wears Glasses: At all times Patient Visual Report: No change from baseline Vision Assessment?: No apparent visual deficits Perception  Perception: Within Functional Limits Praxis Praxis: Intact Cognition Overall Cognitive Status: Within Functional Limits for tasks assessed Arousal/Alertness: Awake/alert Orientation Level: Person;Place;Situation Person: Oriented Place: Oriented Situation: Oriented Year: 2022 Month: March Day of Week: Correct Memory: Appears intact Immediate Memory Recall: Sock;Blue;Bed Memory Recall Sock: Without Cue Memory Recall Blue: Without Cue Memory Recall Bed: Without Cue Attention: Focused Focused Attention: Appears intact Awareness: Appears intact Problem Solving: Impaired (mildly) Safety/Judgment: Appears intact Comments: decreased safety awareness at time vs stubborness Sensation Sensation Light Touch: Impaired by gross assessment (phantom pain at times in RLE) Proprioception: Appears Intact Coordination Gross Motor Movements are  Fluid and Coordinated: No Fine Motor Movements are Fluid and Coordinated: Yes Coordination and Movement Description: impaired grossly 2/2 R BKA Motor  Motor Motor: Abnormal postural alignment and control Motor - Skilled Clinical Observations: R BKA  Trunk/Postural Assessment  Cervical Assessment Cervical Assessment: Exceptions to [facility] Thoracic Assessment Thoracic Assessment: Exceptions to [facility] Lumbar Assessment Lumbar Assessment: Exceptions to [facility] Postural Control Postural Control: Deficits on evaluation  Balance Balance Balance Assessed: Yes Static Sitting Balance Static Sitting - Balance Support: No upper extremity supported;Feet supported Static Sitting - Level of Assistance: 5: Stand by assistance Dynamic Sitting Balance Dynamic Sitting - Balance Support: No upper extremity supported;Feet supported;During functional activity Dynamic Sitting - Level of Assistance: 5: Stand by assistance Dynamic Sitting - Balance Activities: Lateral lean/weight shifting;Forward lean/weight shifting;Reaching for Consulting civil engineer Standing - Balance Support: Bilateral upper extremity supported;During functional activity Static Standing - Level of Assistance: 4: Min assist Dynamic Standing Balance Dynamic Standing - Balance Support: Bilateral upper extremity supported;During functional activity Dynamic Standing - Level of Assistance: 4: Min assist Extremity/Trunk Assessment RUE Assessment RUE Assessment: Within Functional Limits General Strength Comments: 4/5 grossly LUE Assessment LUE Assessment: Within Functional Limits General Strength Comments: 4/5 grossly  Care Tool Care Tool Self Care Eating   Eating Assist Level: Independent    Oral Care    Oral Care Assist Level: Set up assist    Bathing   Body parts bathed by patient: Right arm;Left arm;Chest;Abdomen;Front perineal area;Right upper leg;Left upper leg;Face Body parts bathed by helper: Buttocks;Left lower  leg Body parts n/a: Right lower leg Assist Level: Minimal Assistance - Patient > 75%    Upper Body Dressing(including orthotics)   What is the patient wearing?: Hospital gown only   Assist Level: Minimal Assistance - Patient > 75%    Lower Body Dressing (excluding footwear)   What  is the patient wearing?: Hospital gown only Assist for lower body dressing: Minimal Assistance - Patient > 75%    Putting on/Taking off footwear   What is the patient wearing?: Non-skid slipper socks Assist for footwear: Dependent - Patient 0%       Care Tool Toileting Toileting activity     Min      Care Tool Bed Mobility Roll left and right activity   Roll left and right assist level: Independent    Sit to lying activity   Sit to lying assist level: Independent    Lying to sitting edge of bed activity   Lying to sitting edge of bed assist level: Independent     Care Tool Transfers Sit to stand transfer   Sit to stand assist level: Minimal Assistance - Patient > 75%    Chair/bed transfer   Chair/bed transfer assist level: Contact Guard/Touching assist (squat pivot)     Toilet transfer   Assist Level:  (not assessed d/t pain, did not need to void, limited at eval)     Care Tool Cognition Expression of Ideas and Wants Expression of Ideas and Wants: Without difficulty (complex and basic) - expresses complex messages without difficulty and with speech that is clear and easy to understand   Understanding Verbal and Non-Verbal Content Understanding Verbal and Non-Verbal Content: Understands (complex and basic) - clear comprehension without cues or repetitions   Memory/Recall Ability *first 3 days only Memory/Recall Ability *first 3 days only: Current season;Location of own room;Staff names and faces;That he or she is in a hospital/hospital unit    Refer to Care Plan for [place] 1 OT Short Term Goal 1 (Week 1): STGs = LTGs d/t ELOS at Mod I  level  Recommendations for other services: None    Skilled Therapeutic Intervention ADL ADL Eating: Independent Grooming: Setup Upper Body Bathing: Setup Where Assessed-Upper Body Bathing: Edge of bed Lower Body Bathing: Minimal assistance Where Assessed-Lower Body Bathing: Edge of bed Upper Body Dressing: Minimal assistance Where Assessed-Upper Body Dressing: Edge of bed Lower Body Dressing: Minimal assistance Where Assessed-Lower Body Dressing: Edge of bed Toileting: Not assessed Toilet Transfer: Not assessed Mobility  Bed Mobility Bed Mobility: Rolling Right;Rolling Left;Supine to Sit;Sit to Supine Rolling Right: Independent Rolling Left: Independent Supine to Sit: Independent Sit to Supine: Independent Transfers Sit to Stand: Minimal Assistance - Patient > 75%    Skilled Intervention: Pt greeted at time of session semi reclined in bed, BLEs over edge of bed, stating he was feeling tired after not sleeping last night from visiting wife in ICU. Pt discussing current situation with wife throughout session, DC planning, family assist, etc. See above and below for details.   Pt sitting EOB approx 40 minutes throughout session for ADL, good sitting balance noted throughout with minimal fatigue. Agreeable to sink level bathing in ititially but d/t pain in residual limb unable and requested EOB bathing. Min overall for bathing tasks EOB. Declined dressing in shirt/pants as he didn't feel up to it but did want to change gown, performed with Min A. Sit <> stand at RW with Min A, likes to pull up from walker and tried to problem solve technique to push up from bed. Focus of session on problem solving home set up to get in bathroom (has a step to get in), limb loss education and positioning, discussion of prosthetic timeline, and ROM for residual limb at knee to promote extension for future ambulation. Pt  in pain at end of session, sit > supine Indep and elevated distal end of residual limb  (not under knee) with pillow to promote knee extension. Nursing aware of pain. Alarm on call bell in reach.    Discharge Criteria: Patient will be discharged from OT if patient refuses treatment 3 consecutive times without medical reason, if treatment goals not met, if there is a change in medical status, if patient makes no progress towards goals or if patient is discharged from hospital.  The above assessment, treatment plan, treatment alternatives and goals were discussed and mutually agreed upon: by patient  Viona Gilmore 12/17/2020, 12:24 PM

## 2020-12-17 NOTE — Progress Notes (Signed)
[facility] Individual Statement of Services  Patient Name:  Keith Espinoza  Date:  12/17/2020  Welcome to the [facility].  Our goal is to provide you with an individualized program based on your diagnosis and situation, designed to meet your specific needs.  With this comprehensive rehabilitation program, you will be expected to participate in at least 3 hours of rehabilitation therapies Monday-Friday, with modified therapy programming on the weekends.  Your rehabilitation program will include the following services:  Physical Therapy (PT), Occupational Therapy (OT), 24 hour per day rehabilitation nursing, Neuropsychology, Care Coordinator, Rehabilitation Medicine, Nutrition Services and Pharmacy Services  Weekly team conferences will be held on Tuesday to discuss your progress.  Your Inpatient Rehabilitation Care Coordinator will talk with you frequently to get your input and to update you on team discussions.  Team conferences with you and your family in attendance may also be held.  Expected length of stay: 7-10 days  Overall anticipated outcome: independent with device  Depending on your progress and recovery, your program may change. Your Inpatient Rehabilitation Care Coordinator will coordinate services and will keep you informed of any changes. Your Inpatient Rehabilitation Care Coordinator's name and contact numbers are listed  below.  The following services may also be recommended but are not provided by the [facility] will be made to provide these services after discharge if needed.  Arrangements include referral to agencies that provide these services.  Your insurance has been verified to be:  [place] primary doctor is:  [name]  Pertinent information will be shared with your  doctor and your insurance company.  Inpatient Rehabilitation Care Coordinator:  Ovidio Kin, Sigurd or Emilia Beck  Information discussed with and copy given to patient by: Elease Hashimoto, 12/17/2020, 10:42 AM

## 2020-12-17 NOTE — Progress Notes (Signed)
Inpatient Rehabilitation Care Coordinator Assessment and Plan Patient Details  Name: Keith Espinoza MRN: [id number] Date of Birth: 1940/11/17  Today's Date: 12/17/2020  Hospital Problems: Principal Problem:   Below-knee amputation of right lower extremity [facility])  Past Medical History:  Past Medical History:  Diagnosis Date  . Allergic rhinitis   . Allergic rhinitis   . Arthritis   . Basal cell carcinoma 11/01/2019    bcc left chest treatment [place] cx3 40fu   . Chronic leg pain    right  . Chronic lower back pain   . Coronary artery disease    a. Stenting to RCA 2004; staged DES to LAD and Cx 2004. DES to mRCA 2012. b. DES to mCx, PTCA to dCx 11/2011. c. Lateral wall MI 2013 s/p PTCA to distal Cx & DES to mid OM2 11/2011. d. Low risk nuc 04/2014, EF wnl.  . Diabetes mellitus    Insulin dependent  . Diabetic neuropathy (HCC)    MILD  . Diverticulosis   . Dysrhythmia   . Gilbert syndrome   . Gout    right wrist; right foot; right elbow; have had it since 1970's  . H/O hiatal hernia   . Heart murmur   . History of echocardiogram    aortic sclerosis per echo 12/09 EF 65%, otherwise normal  . History of hemorrhoids    BLEEDING  . History of kidney stones    h/o  . Hypertension    Diagnosed 1995   . Myocardial infarction ([place])   . Pancreatic pseudocyst    a. s/p remote drainage 2006.  Marland Kitchen Thrombocytopenia ([place])    Seen on oldest labs in system from 2004  . Vitamin B 12 deficiency    orally replaced   Past Surgical History:  Past Surgical History:  Procedure Laterality Date  . ABDOMINAL AORTOGRAM W/LOWER EXTREMITY Bilateral 08/08/2020   Procedure: ABDOMINAL AORTOGRAM W/LOWER EXTREMITY;  Surgeon: Angelia Mould, MD;  Location: [facility];  Service: Cardiovascular;  Laterality: Bilateral;  . AMPUTATION Right 12/12/2020   Procedure: RIGHT BELOW KNEE AMPUTATION;  Surgeon: [name], MD;  Location: [place];  Service: Orthopedics;  Laterality: Right;  . BACK SURGERY      "total of 3 times" S/P fall   . CARPAL TUNNEL RELEASE Bilateral   . CHOLECYSTECTOMY  1990's  . COLONOSCOPY    . CORONARY ANGIOPLASTY  11/11/11  . CORONARY ANGIOPLASTY WITH STENT PLACEMENT  09/30/2011   "1 then; makes a total of 4"  . CORONARY ANGIOPLASTY WITH STENT PLACEMENT  11/11/11   "1; makes a total of 5"  . INGUINAL HERNIA REPAIR  2003   right  . JOINT REPLACEMENT Right 04/03/2002   hip replacment  . KNEE ARTHROSCOPY  1990's   left  . LEFT HEART CATHETERIZATION WITH CORONARY ANGIOGRAM N/A 09/30/2011   Procedure: LEFT HEART CATHETERIZATION WITH CORONARY ANGIOGRAM;  Surgeon: Jettie Booze, MD;  Location: [facility] CATH LAB;  Service: Cardiovascular;  Laterality: N/A;  possible PCI  . LEFT HEART CATHETERIZATION WITH CORONARY ANGIOGRAM N/A 11/15/2011   Procedure: LEFT HEART CATHETERIZATION WITH CORONARY ANGIOGRAM;  Surgeon: Jettie Booze, MD;  Location: [facility] CATH LAB;  Service: Cardiovascular;  Laterality: N/A;  . PERCUTANEOUS CORONARY STENT INTERVENTION (PCI-S)  09/30/2011   Procedure: PERCUTANEOUS CORONARY STENT INTERVENTION (PCI-S);  Surgeon: Jettie Booze, MD;  Location: [facility] CATH LAB;  Service: Cardiovascular;;  . PERCUTANEOUS CORONARY STENT INTERVENTION (PCI-S) N/A 11/11/2011   Procedure: PERCUTANEOUS CORONARY STENT INTERVENTION (PCI-S);  Surgeon: Jettie Booze, MD;  Location: [facility] CATH LAB;  Service: Cardiovascular;  Laterality: N/A;  . PERIPHERAL VASCULAR BALLOON ANGIOPLASTY Right 08/08/2020   Procedure: PERIPHERAL VASCULAR BALLOON ANGIOPLASTY;  Surgeon: Angelia Mould, MD;  Location: [place] [facility];  Service: Cardiovascular;  Laterality: Right;  Posterior tibial   . SHOULDER SURGERY Right    X 2  . TONSILLECTOMY  ~ 1948  . TOTAL HIP REVISION Right 04/13/2019   Procedure: RIGHT TOTAL HIP REVISION-POSTERIOR  APPROACH LATERAL;  Surgeon: Marybelle Killings, MD;  Location: [place];  Service: Orthopedics;  Laterality: Right;  . TOTAL KNEE ARTHROPLASTY Left 07/23/2016    Procedure: LEFT TOTAL KNEE ARTHROPLASTY;  Surgeon: Marybelle Killings, MD;  Location: [place];  Service: Orthopedics;  Laterality: Left;   Social History:  reports that he has quit smoking. His smokeless tobacco use includes chew. He reports that he does not drink alcohol and does not use drugs.  Family / Support Systems Marital Status: Married Patient Roles: Spouse,Parent,Other (Comment) (farmer) Spouse/Significant Other: [phone]  [phone] Children: [name]-daughter [phone]-home  [id number]-cell Other Supports: Ebony Hail  & Tiffany  grown granddaughters Anticipated Caregiver: Daughter and grnaddaughter's Wife currently in ICU at [facility] Ability/Limitations of Caregiver: Daughter and granddaughter's work Careers adviser: Other (Comment) (Will work on care plan) Family Dynamics: Close knit family pt and wife had two daughter's their eldest is deceased and they have one daughter left who is local along with her two daughter's. pt has a sister and some friends who are supportive. He relies upon his family to get him through. He is worried aobut his wife and feels only has a little information regarding her condition.  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   Abuse/Neglect Abuse/Neglect Assessment Can Be Completed: Yes Physical Abuse: Denies Verbal Abuse: Denies Sexual Abuse: Denies Exploitation of patient/patient's resources: Denies Self-Neglect: Denies  Emotional Status Pt's affect, behavior and adjustment status: Pt is motivated to do well but down regarding his wife and her condition. She has had syncope episodes before but this  time he was not there with her. He wants to get back to independent level like he was, but knows it will take time and he is going through the motions right now. Recent Psychosocial Issues: other health issues-wife being in the iCU unsure of prognosis. He assisted her prior to admission-transported to HD and helped Psychiatric History: NO history-pt is somewhat depressed due to wife being here and gravely ill. He is understandably depressed and worred about her. Will ask neuro-psych to see while here for coping Substance Abuse History: Quit tobacco years ago no other issues  Patient / Family Perceptions, Expectations & Goals Pt/Family understanding of illness & functional limitations: Pt and daughter can explain his amputation and voiced they tried to save his leg, but couldn't. Both have spoken with the MD and feel they have a good understanding of his treatment plan going forward. Both have spoken with Dr Sharol Given the surgeon Premorbid pt/family roles/activities: Husband, father, grandfather, farmer, friend, etc Anticipated changes in roles/activities/participation: resume Pt/family expectations/goals: Pt states: " I'm ok I will get there, but worry about my wife, I saw her this morning."  Daughter states: " It is difficult with both here, you only  expect one at a time."  [employer]: None Premorbid Home Care/DME Agencies: Other (Comment) (has rw, bsc, tub seat) Transportation available at discharge: Patient did drive PTA, will not rely upon his daughter or granddaughter's Resource referrals recommended: Neuropsychology,Support group (specify)  Discharge Planning Living Arrangements: Spouse/significant other Support Systems: Spouse/significant other,Children,Other relatives,Friends/neighbors Type of Residence: Private residence Insurance Resources: [employer] (specify) (mutual of [place]) Financial Resources: Therapist, art  Screen Referred: No Living Expenses: Own Money Management: Patient Does the patient have any problems obtaining your medications?: No Home Management: Pt and daughter Patient/Family Preliminary Plans: Return home with daughter and granddaughter's assisting him. He hopes to be mod/i level, and not need 24/7 care. He does not want to burden his family, they all work. His daughter is a Chief Executive Officer in a NH. Care Coordinator Anticipated Follow Up Needs: HH/OP  Clinical Impression Pleasant gentleman who is motivated to improve so can assist wife if needed. He is not sure how well wife is doing in ICU he visits her daily. His daughter and two granddaughter's are involved and will assist him at discharge. Pt assisted wife prior to admission. Will await therapy evaluations and work on discharge needs.  Elease Hashimoto 12/17/2020, 10:40 AM

## 2020-12-17 NOTE — Plan of Care (Signed)
  Problem: RH Balance Goal: LTG: Patient will maintain dynamic sitting balance (OT) Description: LTG:  Patient will maintain dynamic sitting balance with assistance during activities of daily living (OT) Flowsheets (Taken 12/17/2020 1238) LTG: Pt will maintain dynamic sitting balance during ADLs with: Independent Goal: LTG Patient will maintain dynamic standing with ADLs (OT) Description: LTG:  Patient will maintain dynamic standing balance with assist during activities of daily living (OT)  Flowsheets (Taken 12/17/2020 1238) LTG: Pt will maintain dynamic standing balance during ADLs with: Independent with assistive device   Problem: Sit to Stand Goal: LTG:  Patient will perform sit to stand in prep for activites of daily living with assistance level (OT) Description: LTG:  Patient will perform sit to stand in prep for activites of daily living with assistance level (OT) Flowsheets (Taken 12/17/2020 1238) LTG: PT will perform sit to stand in prep for activites of daily living with assistance level: Independent with assistive device   Problem: RH Grooming Goal: LTG Patient will perform grooming w/assist,cues/equip (OT) Description: LTG: Patient will perform grooming with assist, with/without cues using equipment (OT) Flowsheets (Taken 12/17/2020 1238) LTG: Pt will perform grooming with assistance level of: Independent with assistive device    Problem: RH Bathing Goal: LTG Patient will bathe all body parts with assist levels (OT) Description: LTG: Patient will bathe all body parts with assist levels (OT) Flowsheets (Taken 12/17/2020 1238) LTG: Pt will perform bathing with assistance level/cueing: Independent with assistive device    Problem: RH Dressing Goal: LTG Patient will perform upper body dressing (OT) Description: LTG Patient will perform upper body dressing with assist, with/without cues (OT). Flowsheets (Taken 12/17/2020 1238) LTG: Pt will perform upper body dressing with assistance  level of: Independent with assistive device Goal: LTG Patient will perform lower body dressing w/assist (OT) Description: LTG: Patient will perform lower body dressing with assist, with/without cues in positioning using equipment (OT) Flowsheets (Taken 12/17/2020 1238) LTG: Pt will perform lower body dressing with assistance level of: Independent with assistive device   Problem: RH Toileting Goal: LTG Patient will perform toileting task (3/3 steps) with assistance level (OT) Description: LTG: Patient will perform toileting task (3/3 steps) with assistance level (OT)  Flowsheets (Taken 12/17/2020 1238) LTG: Pt will perform toileting task (3/3 steps) with assistance level: Independent with assistive device   Problem: RH Simple Meal Prep Goal: LTG Patient will perform simple meal prep w/assist (OT) Description: LTG: Patient will perform simple meal prep with assistance, with/without cues (OT). Flowsheets (Taken 12/17/2020 1238) LTG: Pt will perform simple meal prep with assistance level of: Independent with assistive device   Problem: RH Toilet Transfers Goal: LTG Patient will perform toilet transfers w/assist (OT) Description: LTG: Patient will perform toilet transfers with assist, with/without cues using equipment (OT) Flowsheets (Taken 12/17/2020 1238) LTG: Pt will perform toilet transfers with assistance level of: Independent with assistive device   Problem: RH Tub/Shower Transfers Goal: LTG Patient will perform tub/shower transfers w/assist (OT) Description: LTG: Patient will perform tub/shower transfers with assist, with/without cues using equipment (OT) Flowsheets (Taken 12/17/2020 1238) LTG: Pt will perform tub/shower stall transfers with assistance level of: Independent with assistive device

## 2020-12-17 NOTE — Evaluation (Signed)
Physical Therapy Assessment and Plan  Patient Details  Name: Keith Espinoza MRN: [id number] Date of Birth: 19-Jan-1941  PT Diagnosis: Abnormality of gait, Difficulty walking and Pain in joint Rehab Potential: Good ELOS: 7-10 days   Today's Date: 12/17/2020 PT Individual Time: 0800-0900 PT Individual Time Calculation (min): 60 min    Hospital Problem: Principal Problem:   Below-knee amputation of right lower extremity ([place])   Past Medical History:  Past Medical History:  Diagnosis Date  . Allergic rhinitis   . Allergic rhinitis   . Arthritis   . Basal cell carcinoma 11/01/2019    bcc left chest treatment TX cx3 60f   . Chronic leg pain    right  . Chronic lower back pain   . Coronary artery disease    a. Stenting to RCA 2004; staged DES to LAD and Cx 2004. DES to mRCA 2012. b. DES to mCx, PTCA to dCx 11/2011. c. Lateral wall MI 2013 s/p PTCA to distal Cx & DES to mid OM2 11/2011. d. Low risk nuc 04/2014, EF wnl.  . Diabetes mellitus    Insulin dependent  . Diabetic neuropathy (HCC)    MILD  . Diverticulosis   . Dysrhythmia   . Gilbert syndrome   . Gout    right wrist; right foot; right elbow; have had it since 1970's  . H/O hiatal hernia   . Heart murmur   . History of echocardiogram    aortic sclerosis per echo 12/09 EF 65%, otherwise normal  . History of hemorrhoids    BLEEDING  . History of kidney stones    h/o  . Hypertension    Diagnosed 1995   . Myocardial infarction ([facility]   . Pancreatic pseudocyst    a. s/p remote drainage 2006.  .[place] Kitchen[place]rombocytopenia ([place]    Seen on oldest labs in system from 2004  . Vitamin B 12 deficiency    orally replaced   Past Surgical History:  Past Surgical History:  Procedure Laterality Date  . ABDOMINAL AORTOGRAM W/LOWER EXTREMITY Bilateral 08/08/2020   Procedure: ABDOMINAL AORTOGRAM W/LOWER EXTREMITY;  Surgeon: DAngelia Mould MD;  [facility];  Service: Cardiovascular;  Laterality: Bilateral;  .  AMPUTATION Right 12/12/2020   Procedure: RIGHT BELOW KNEE AMPUTATION;  Surgeon: DNewt Minion MD;  [place] [facility]  Service: Orthopedics;  Laterality: Right;  . BACK SURGERY     "total of 3 times" S/P fall   . CARPAL TUNNEL RELEASE Bilateral   . CHOLECYSTECTOMY  1990's  . COLONOSCOPY    . CORONARY ANGIOPLASTY  11/11/11  . CORONARY ANGIOPLASTY WITH STENT PLACEMENT  09/30/2011   "1 then; makes a total of 4"  . CORONARY ANGIOPLASTY WITH STENT PLACEMENT  11/11/11   "1; makes a total of 5"  . INGUINAL HERNIA REPAIR  2003   right  . JOINT REPLACEMENT Right 04/03/2002   hip replacment  . KNEE ARTHROSCOPY  1990's   left  . LEFT HEART CATHETERIZATION WITH CORONARY ANGIOGRAM N/A 09/30/2011   Procedure: LEFT HEART CATHETERIZATION WITH CORONARY ANGIOGRAM;  Surgeon: JJettie Booze MD;  [facility];  Service: Cardiovascular;  Laterality: N/A;  possible PCI  . LEFT HEART CATHETERIZATION WITH CORONARY ANGIOGRAM N/A 11/15/2011   Procedure: LEFT HEART CATHETERIZATION WITH CORONARY ANGIOGRAM;  Surgeon: JJettie Booze MD;  [facility];  Service: Cardiovascular;  Laterality: N/A;  . PERCUTANEOUS CORONARY STENT INTERVENTION (PCI-S)  09/30/2011   Procedure: PERCUTANEOUS CORONARY STENT INTERVENTION (PCI-S);  Surgeon: Jettie Booze, MD;  Location: [facility] CATH LAB;  Service: Cardiovascular;;  . PERCUTANEOUS CORONARY STENT INTERVENTION (PCI-S) N/A 11/11/2011   Procedure: PERCUTANEOUS CORONARY STENT INTERVENTION (PCI-S);  Surgeon: Jettie Booze, MD;  Location: [facility] CATH LAB;  Service: Cardiovascular;  Laterality: N/A;  . PERIPHERAL VASCULAR BALLOON ANGIOPLASTY Right 08/08/2020   Procedure: PERIPHERAL VASCULAR BALLOON ANGIOPLASTY;  Surgeon: Angelia Mould, MD;  Location: [place] [facility];  Service: Cardiovascular;  Laterality: Right;  Posterior tibial   . SHOULDER SURGERY Right    X 2  . TONSILLECTOMY  ~ 1948  . TOTAL HIP REVISION Right 04/13/2019   Procedure: RIGHT TOTAL  HIP REVISION-POSTERIOR  APPROACH LATERAL;  Surgeon: Marybelle Killings, MD;  Location: [place];  Service: Orthopedics;  Laterality: Right;  . TOTAL KNEE ARTHROPLASTY Left 07/23/2016   Procedure: LEFT TOTAL KNEE ARTHROPLASTY;  Surgeon: Marybelle Killings, MD;  Location: [place];  Service: Orthopedics;  Laterality: Left;    Assessment & Plan Clinical Impression:  [name]. 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 He also reported recent fall as well as numbness in left hand felt to be due to nerve injury.  He was admitted on 12/12/20 for L-BKA by Dr. Sharol Given. Post op had issues with pain control, acute renal failure with rise in SCr to 1.4, hyponatremia as well as elevated BS up to 400's. Therapy initiated and patient noted to have limitations in mobility due to R-BKA, pain and weakness affecting ADLs and mobility. CIR recommended due to functional decline. Patient transferred to CIR on 12/16/2020 .   Patient currently requires min with mobility secondary to muscle weakness and muscle joint tightness, decreased cardiorespiratoy endurance and decreased standing balance, decreased postural control and difficulty maintaining precautions.  Prior to hospitalization, patient was independent  with mobility and lived with Spouse in a House home.  Home access is  Level entry.  Patient will benefit from skilled PT intervention to maximize safe functional mobility, minimize fall risk and decrease caregiver burden for planned discharge home with intermittent assist.  Anticipate patient will benefit from follow up [facility] at discharge.  PT - End of Session Activity Tolerance: Tolerates 30+ min activity with multiple rests Endurance Deficit: Yes Endurance Deficit Description: frequent  rest breaks during functional activity PT Assessment Rehab Potential (ACUTE/IP ONLY): Good PT Barriers to Discharge: Decreased caregiver support;Lack of/limited family support;Weight bearing restrictions;Other (comments) PT Barriers to Discharge Comments: wife currently in ICU PT Patient demonstrates impairments in the following area(s): Balance;Endurance;Pain;Safety;Sensory PT Transfers Functional Problem(s): Bed Mobility;Bed to Chair;Car;Furniture;Floor PT Locomotion Functional Problem(s): Ambulation;Wheelchair Mobility;Stairs PT Plan PT Intensity: Minimum of 1-2 x/day ,45 to 90 minutes PT Frequency: 5 out of 7 days PT Duration Estimated Length of Stay: 7-10 days PT Treatment/Interventions: Ambulation/gait training;Balance/vestibular training;Community reintegration;Discharge planning;Disease management/prevention;DME/adaptive equipment instruction;Functional mobility training;Neuromuscular re-education;Pain management;Patient/family education;Psychosocial support;Splinting/orthotics;Therapeutic Activities;Therapeutic Exercise;UE/LE Strength taining/ROM;UE/LE Coordination activities;Wheelchair propulsion/positioning PT Transfers Anticipated Outcome(s): mod I PT Locomotion Anticipated Outcome(s): mod I with LRAD PT Recommendation Recommendations for Other Services: Neuropsych consult Follow Up Recommendations: Home health PT Patient destination: Home Equipment Recommended: Rolling walker with 5" wheels;Wheelchair (measurements);Wheelchair cushion (measurements)   PT Evaluation Precautions/Restrictions Precautions Precautions: Fall Precaution Comments: R BKA Required Braces or Orthoses: Other Brace Other Brace: residual limb guard Restrictions Weight Bearing Restrictions:  Yes RLE Weight Bearing: Non weight bearing Home Living/Prior Functioning Home Living Available Help at Discharge: Family;Friend(s);Available PRN/intermittently Type of Home: House Home Access: Level entry Home  Layout: One level Additional Comments: pt reports step up to get to bathroom, otherwise home is one level; assist upon d/c obtained from chart, pt primary caregiver for wife  Lives With: Spouse Prior Function Level of Independence: Independent with gait;Independent with transfers  Able to Take Stairs?: Yes Driving: Yes Comments: would drive wife to HD Vision/Perception  Perception Perception: Within Functional Limits Praxis Praxis: Intact  Cognition Overall Cognitive Status: Within Functional Limits for tasks assessed Arousal/Alertness: Awake/alert Orientation Level: Oriented X4 Attention: Focused Focused Attention: Appears intact Memory: Appears intact Awareness: Appears intact Problem Solving: Impaired Safety/Judgment: Impaired Comments: decreased safety awareness at time vs stubborness Sensation Sensation Light Touch: Impaired by gross assessment Proprioception: Appears Intact Coordination Gross Motor Movements are Fluid and Coordinated: No Fine Motor Movements are Fluid and Coordinated: Yes Coordination and Movement Description: impaired 2/2 R BKA Motor  Motor Motor: Abnormal postural alignment and control Motor - Skilled Clinical Observations: R BKA  Trunk/Postural Assessment  Cervical Assessment Cervical Assessment: Exceptions to [facility] (forward head) Thoracic Assessment Thoracic Assessment: Exceptions to [facility] (kyphotic) Lumbar Assessment Lumbar Assessment: Exceptions to [facility] (posterior pelvic tilt) Postural Control Postural Control: Deficits on evaluation (decreased control in standing)  Balance Balance Balance Assessed: Yes Static Sitting Balance Static Sitting - Balance Support: No upper extremity supported;Feet supported Static Sitting - Level of Assistance: 5: Stand by assistance Dynamic Sitting Balance Dynamic Sitting - Balance Support: No upper extremity supported;Feet supported;During functional activity Dynamic Sitting - Level of Assistance: 5: Stand by  assistance Static Standing Balance Static Standing - Balance Support: Bilateral upper extremity supported;During functional activity Static Standing - Level of Assistance: 4: Min assist Dynamic Standing Balance Dynamic Standing - Balance Support: Bilateral upper extremity supported;During functional activity Dynamic Standing - Level of Assistance: 4: Min assist Extremity Assessment   RLE Assessment RLE Assessment: Exceptions to [facility] Passive Range of Motion (PROM) Comments: tight HS General Strength Comments: impaired 2/2 R BKA, not formally assessed LLE Assessment LLE Assessment: Within Functional Limits Passive Range of Motion (PROM) Comments: tight HS Active Range of Motion (AROM) Comments: decreased knee flexion AROM General Strength Comments: 5/5 grossly  Care Tool Care Tool Bed Mobility Roll left and right activity   Roll left and right assist level: Independent    Sit to lying activity   Sit to lying assist level: Independent    Lying to sitting edge of bed activity   Lying to sitting edge of bed assist level: Independent     Care Tool Transfers Sit to stand transfer   Sit to stand assist level: Minimal Assistance - Patient > 75% (with RW)    Chair/bed transfer   Chair/bed transfer assist level: Contact Guard/Touching assist (squat pivot)     Psychologist, counselling transfer activity did not occur: Safety/medical concerns        Care Tool Locomotion Ambulation   Assist level: Minimal Assistance - Patient > 75% Assistive device: Walker-rolling Max distance: 45 ft  Walk 10 feet activity   Assist level: Minimal Assistance - Patient > 75% Assistive device: Walker-rolling   Walk 50 feet with 2 turns activity Walk 50 feet with 2 turns activity did not occur: Safety/medical concerns      Walk 150 feet activity Walk 150 feet activity did not occur: Safety/medical concerns  Walk 10 feet on uneven surfaces activity Walk 10 feet on uneven  surfaces activity did not occur: Safety/medical concerns      Stairs Stair activity did not occur: Safety/medical concerns        Walk up/down 1 step activity Walk up/down 1 step or curb (drop down) activity did not occur: Safety/medical concerns     Walk up/down 4 steps activity did not occuR: Safety/medical concerns  Walk up/down 4 steps activity      Walk up/down 12 steps activity Walk up/down 12 steps activity did not occur: Safety/medical concerns      Pick up small objects from floor Pick up small object from the floor (from standing position) activity did not occur: Safety/medical concerns      Wheelchair Will patient use wheelchair at discharge?: Yes Type of Wheelchair: Manual   Wheelchair assist level: Supervision/Verbal cueing Max wheelchair distance: 50 ft  Wheel 50 feet with 2 turns activity   Assist Level: Supervision/Verbal cueing  Wheel 150 feet activity   Assist Level: Maximal Assistance - Patient 25 - 49%    Refer to Care Plan for Long Term Goals  SHORT TERM GOAL WEEK 1 PT Short Term Goal 1 (Week 1): =LTG due to ELOS  Recommendations for other services: Neuropsych  Skilled Therapeutic Intervention Evaluation completed (see details above and below) with education on PT POC and goals and individual treatment initiated with focus on functional transfer assessment, orientation to rehab unit and schedule, and setting pt with appropriate equipment to be utilized during rehab stay. Pt received seated EOB, agreeable to PT evaluation. Pt reports pain in R residual limb, not rated and reports being premedicated prior to start of therapy session. Bed mobility independent. Squat pivot transfer bed to/from w/c with CGA. Set pt up with R residual limb support on w/c, pt unable to tolerate full knee extension due to HS tightness in RLE. Education with patient regarding limb positioning. Pt also found to not be wearing residual limb guard, educated pt on purpose of wearing limb  guard but he refuses to wear this session due to pain. Sit to stand with min A to RW. Ambulation x 45 ft with RW and min A for balance, cues for safe RW management and to not use rail in hallway but to gain balance with RW. Manual w/c propulsion x 50 ft with use of BUE and LLE at Supervision level. Squat pivot transfer back to bed with CGA. Pt left supine in bed with needs in reach, bed alarm in place at end of session.  Mobility Bed Mobility Bed Mobility: Rolling Right;Rolling Left;Supine to Sit;Sit to Supine Rolling Right: Independent Rolling Left: Independent Supine to Sit: Independent Sit to Supine: Independent Transfers Transfers: Sit to Stand;Stand Pivot Transfers;Squat Pivot Transfers Sit to Stand: Minimal Assistance - Patient > 75% Stand Pivot Transfers: Minimal Assistance - Patient > 75% Stand Pivot Transfer Details: Verbal cues for precautions/safety;Verbal cues for safe use of DME/AE Squat Pivot Transfers: Contact Guard/Touching assist Locomotion  Gait Gait Distance (Feet): 45 Feet Assistive device: Rolling walker Gait Gait Pattern: Impaired ("hopping" with LLE) Gait velocity: decreased Stairs / Additional Locomotion Stairs: No Wheelchair Mobility Wheelchair Mobility: Yes Wheelchair Assistance: Chartered loss adjuster: Both upper extremities;Left lower extremity Wheelchair Parts Management: Needs assistance Distance: 50 ft   Discharge Criteria: Patient will be discharged from PT if patient refuses treatment 3 consecutive times without medical reason, if treatment goals not met, if there is a change in medical status, if patient makes  no progress towards goals or if patient is discharged from hospital.  The above assessment, treatment plan, treatment alternatives and goals were discussed and mutually agreed upon: by patient   Excell Seltzer, PT, DPT 12/17/2020, 9:45 AM

## 2020-12-17 NOTE — Progress Notes (Signed)
Physical Therapy Session Note  Patient Details  Name: Keith Espinoza MRN: [id number] Date of Birth: 1941/06/11  Today's Date: 12/17/2020 PT Individual Time: 3428-7681 PT Individual Time Calculation (min): 45 min   Today's Date: 12/17/2020 PT Missed Time: 15 Minutes Missed Time Reason: Pain  Short Term Goals: Week 1:  PT Short Term Goal 1 (Week 1): =LTG due to ELOS  Skilled Therapeutic Interventions/Progress Updates:   Received pt sitting EOB with his pastor. Pt agreeable to therapy and reported pain 4/10 in R residual limb. RN notified and present to administer pain medication. Pt with increased pain throughout session requiring increased time and rest breaks. Sit<>supine with supervision for NT to check vitals. Of note, pt prefers to let R residual limb dangle off bed due to pain keeping it elevated and extended. Educated pt on importance of keeping limb in extension to prevent further contractures (as pt appears to already have hamstring contracture). Educated pt on desensitization techniques for pain management and pt performed throughout session with onset of phantom limb spasms. Worked on positioning in bed, discovering L sidelying is more comfortable than supine. Pt able to perform x1 RLE SLR in supine but stopped due to increased pain. Pt performed the following exercises in sidelying with supervision and verbal cues for technique: -L sidelying R hip abduction 2x10 -L sidelying R hip extension 2x10 L sidelying<>prone with increased time. Pt performed the following exercises in prone with supervision and verbal cues for technique: -hamstring curls 2x10 on LLE and 2x5 on RLE -R hip extension x5 (however pt compensating by attempting to roll into sidelying) Returned to supine and placed pillow at end of residual limb to encourage extension, however pt unable to tolerate and requested to sit EOB for relief. Supine<>sit with supervision and pt performed LAQ 2x10 on LLE and x8 on RLE prior to  stopping due to increased pain. Sit<>supine with supervision. Concluded session with pt supine in bed with pt insisting on hanging R residual limb off side of bed, needs within reach, and bed alarm on.15 minutes missed of skilled physical therapy due to pain.   Therapy Documentation Precautions:  Restrictions RLE Weight Bearing: Non weight bearing  Therapy/Group: Individual Therapy Alfonse Alpers PT, DPT   12/17/2020, 7:41 AM

## 2020-12-17 NOTE — Progress Notes (Signed)
Hypoglycemic Event  CBG: 61  Treatment: 8 oz juice/soda  Symptoms: None  Follow-up CBG: Time:1213 CBG Result:123  Possible Reasons for Event: Unknown  Comments/MD notified: Held scheduled 4u Novolog/Dr. Adam Phenix    Scherry Ran

## 2020-12-18 LAB — BASIC METABOLIC PANEL
Anion gap: 12 (ref 5–15)
BUN: 25 mg/dL — ABNORMAL HIGH (ref 8–23)
CO2: 27 mmol/L (ref 22–32)
Calcium: 9.6 mg/dL (ref 8.9–10.3)
Chloride: 93 mmol/L — ABNORMAL LOW (ref 98–111)
Creatinine, Ser: 1.25 mg/dL — ABNORMAL HIGH (ref 0.61–1.24)
GFR, Estimated: 58 mL/min — ABNORMAL LOW (ref >60)
Glucose, Bld: 198 mg/dL — ABNORMAL HIGH (ref 70–99)
Potassium: 4.2 mmol/L (ref 3.5–5.1)
Sodium: 132 mmol/L — ABNORMAL LOW (ref 135–145)

## 2020-12-18 LAB — GLUCOSE, CAPILLARY
Glucose-Capillary: 192 mg/dL — ABNORMAL HIGH (ref 70–99)
Glucose-Capillary: 199 mg/dL — ABNORMAL HIGH (ref 70–99)
Glucose-Capillary: 215 mg/dL — ABNORMAL HIGH (ref 70–99)
Glucose-Capillary: 159 mg/dL — ABNORMAL HIGH (ref 70–99)

## 2020-12-18 MED ORDER — VITAMIN B-12 1000 MCG PO TABS
3000.0000 ug | ORAL_TABLET | Freq: Every day | ORAL | Status: DC
  Administered 2020-12-19 – 2020-12-27 (×9): 3000 ug via ORAL
  Filled 2020-12-18 (×9): qty 3

## 2020-12-18 NOTE — Progress Notes (Signed)
Physical Therapy Session Note  Patient Details  Name: Keith Espinoza MRN: [id number] Date of Birth: [birth year]  Today's Date: 12/18/2020 PT Missed Time: 1 Minutes Missed Time Reason: Other (Comment) (pt going off unit to visit wife in ICU)  Short Term Goals: Week 1:  PT Short Term Goal 1 (Week 1): =LTG due to ELOS  Skilled Therapeutic Interventions/Progress Updates:   Pt received sitting in [facility] preparing to be transported to ICU to visit wife. Per RN, pt's wife in cardiac arrest and doing worse and pt's daughter coming to be with pt and pt's wife during this time. Will attempt to make up time as able. 60 minutes missed of skilled physical therapy.   Therapy Documentation Precautions:  Precautions Precautions: Fall Precaution Comments: R BKA Required Braces or Orthoses: Other Brace Other Brace: residual limb guard if able to tolerate Restrictions Weight Bearing Restrictions: Yes RLE Weight Bearing: Non weight bearing  Therapy/Group: Individual Therapy Alfonse Alpers PT, DPT   12/18/2020, 7:20 AM

## 2020-12-18 NOTE — Progress Notes (Signed)
Physical Therapy Session Note  Patient Details  Name: Keith Espinoza MRN: [id number] Date of Birth: April 11, 1941  Today's Date: 12/18/2020 PT Individual Time: 0935-1015 PT Individual Time Calculation (min): 40 min   Short Term Goals: Week 1:  PT Short Term Goal 1 (Week 1): =LTG due to ELOS  Skilled Therapeutic Interventions/Progress Updates:    Patient received supine in bed, asleep, but easy to wake, agreeable to PT. He reports 4/10 pain in R RL, premedicated. PT providing rest breaks, distractions and repositioning to assist with pain management. Patient distracted by his wifes current health status in the ICU, but continued to be agreeable to PT. He was able to come sit edge of bed with supervision. PT donned limb protector with MaxA. He was able to transfer to wc via lateral scoot with MinA and verbal cues for safety. PT transporting patient in wc to therapy gym for time management and energy conservation. PT providing prolonged stretch to R hamstring 4x30s. PT educating patient on importance of achieving full knee extension and appropriate muscle strengthening in order to prepare for receiving a prosthetic. Patient able to complete SLR and quad set 3x10 with rest breaks due to fatigue and pain. Patient able to propel himself in wc x261ft with B UE and supervision. Patient remaining up in chair at end of session with seatbelt alarm on, call light within reach.    Therapy Documentation Precautions:  Precautions Precautions: Fall Precaution Comments: R BKA Required Braces or Orthoses: Other Brace Other Brace: residual limb guard if able to tolerate Restrictions Weight Bearing Restrictions: Yes RLE Weight Bearing: Non weight bearing    Therapy/Group: Individual Therapy  Karoline Caldwell, PT, DPT, CBIS  12/18/2020, 8:17 AM

## 2020-12-18 NOTE — Progress Notes (Signed)
Physical Therapy Session Note  Patient Details  Name: Keith Espinoza MRN: [id number] Date of Birth: Apr 16, 1941  Today's Date: 12/18/2020 PT Individual Time: 1445-1525 PT Individual Time Calculation (min): 40 min   Short Term Goals: Week 1:  PT Short Term Goal 1 (Week 1): =LTG due to ELOS  Skilled Therapeutic Interventions/Progress Updates:   Received pt supine in bed with NT present checking vitals, pt agreeable to therapy, and reported pain 4/10 in R residual limb (premedicated). Repositioning, rest breaks, and distraction done to reduce pain levels. Session with emphasis on functional mobility/transfers, generalized strengthening, dynamic standing balance/coordination, ambulation, and improved activity tolerance. Supine<>sitting EOB mod I and squat<>pivot to WC with CGA. Pt performed WC mobility 113ft using BUE and supervision and transported remainder of way to 4W therapy gym total A due to fatigue. Sit<>stand with min A with cues for hand placement on RW and pt ambulated 9ft with RW and min A +2 for WC follow. Pt demonstrates good upper body strength and ability to clear LLE from floor when "hopping". Pt performed BUE strengthening on UBE at level 3.5 for 3 minutes forward and 2 minutes backwards with 1 rest break. Pt transported back to room in [facility] total A and transferred WC<>bed via squat<>pivot with supervision and sit<>supine mod I. Concluded session with pt supine in bed, needs within reach, and bed alarm on.  Therapy Documentation Precautions:  Precautions Precautions: Fall Precaution Comments: R BKA Required Braces or Orthoses: Other Brace Other Brace: residual limb guard if able to tolerate Restrictions Weight Bearing Restrictions: Yes RLE Weight Bearing: Non weight bearing  Therapy/Group: Individual Therapy Alfonse Alpers PT, DPT   12/18/2020, 11:17 AM

## 2020-12-18 NOTE — Progress Notes (Signed)
PROGRESS NOTE   Subjective/Complaints: He does not like cutting B12 pill in half- discussed with nurse that she may give him whole pill (changed order to 3,071mcg) Pain is 4/10 this morning.   ROS: +residual limb pain.   Objective:   No results found. Recent Labs    12/17/20 0118  WBC 8.6  HGB 11.4*  HCT 31.7*  PLT 179   Recent Labs    12/17/20 0118 12/18/20 0134  NA 134* 132*  K 3.2* 4.2  CL 95* 93*  CO2 29 27  GLUCOSE 127* 198*  BUN 26* 25*  CREATININE 1.09 1.25*  CALCIUM 9.6 9.6    Intake/Output Summary (Last 24 hours) at 12/18/2020 1124 Last data filed at 12/18/2020 0700 Gross per 24 hour  Intake 680 ml  Output 1150 ml  Net -470 ml        Physical Exam: Vital Signs Blood pressure (!) 123/51, pulse 75, temperature 98.5 F (36.9 C), temperature source Oral, resp. rate 18, height 5\' 10"  (1.778 m), weight 67.4 kg, SpO2 97 %. Gen: no distress, normal appearing HEENT: oral mucosa pink and moist, NCAT Cardio: Reg rate Chest: normal effort, normal rate of breathing Abd: soft, non-distended Ext: no edema Psych: pleasant, normal affect Skin: intact Musculoskeletal:     Cervical back: Normal range of motion and neck supple.     Comments: R-BKA with wound VAC in place. LLE without edema or breakdown.  UEs 5/5 in biceps, triceps, WE grip and finger abd B/L LLE- 5/5 in HF, KE, DF and PF RLE- HF/KE/ 5/5- however lacking 15-20 degrees  Of R knee extension; can get to 95 degrees knee flexion  Skin:    Comments: Wound VAC on R BKA- no drainage- vacuum in place No buttock wounds IV in L forearm- looks OK  Neurological:     Mental Status: He is alert and oriented to person, place, and time.     Comments: Light touch intact x 3+ extremities   Psychiatric:     Comments: Joking, appropriate- but sad    Assessment/Plan: 1. Functional deficits which require 3+ hours per day of interdisciplinary therapy in a  comprehensive inpatient rehab setting.  Physiatrist is providing close team supervision and 24 hour management of active medical problems listed below.  Physiatrist and rehab team continue to assess barriers to discharge/monitor patient progress toward functional and medical goals  Care Tool:  Bathing    Body parts bathed by patient: Right arm,Left arm,Chest,Abdomen,Front perineal area,Right upper leg,Left upper leg,Face   Body parts bathed by helper: Buttocks,Left lower leg Body parts n/a: Right lower leg   Bathing assist Assist Level: Minimal Assistance - Patient > 75%     Upper Body Dressing/Undressing Upper body dressing   What is the patient wearing?: Hospital gown only    Upper body assist Assist Level: Minimal Assistance - Patient > 75%    Lower Body Dressing/Undressing Lower body dressing      What is the patient wearing?: Hospital gown only     Lower body assist Assist for lower body dressing: Minimal Assistance - Patient > 75%     Toileting Toileting    Toileting assist Assist for  toileting: Independent with assistive device Assistive Device Comment: urinal   Transfers Chair/bed transfer  Transfers assist     Chair/bed transfer assist level: Contact Guard/Touching assist (squat pivot)     Locomotion Ambulation   Ambulation assist      Assist level: Minimal Assistance - Patient > 75% Assistive device: Walker-rolling Max distance: 45 ft   Walk 10 feet activity   Assist     Assist level: Minimal Assistance - Patient > 75% Assistive device: Walker-rolling   Walk 50 feet activity   Assist Walk 50 feet with 2 turns activity did not occur: Safety/medical concerns         Walk 150 feet activity   Assist Walk 150 feet activity did not occur: Safety/medical concerns         Walk 10 feet on uneven surface  activity   Assist Walk 10 feet on uneven surfaces activity did not occur: Safety/medical concerns          Wheelchair     Assist Will patient use wheelchair at discharge?: Yes Type of Wheelchair: Manual    Wheelchair assist level: Supervision/Verbal cueing Max wheelchair distance: 50 ft    Wheelchair 50 feet with 2 turns activity    Assist        Assist Level: Supervision/Verbal cueing   Wheelchair 150 feet activity     Assist      Assist Level: Maximal Assistance - Patient 25 - 49%   Blood pressure (!) 123/51, pulse 75, temperature 98.5 F (36.9 C), temperature source Oral, resp. rate 18, height 5\' 10"  (1.778 m), weight 67.4 kg, SpO2 97 %.  Medical Problem List and Plan: 1.  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  Antithrombotics: -DVT/anticoagulation:  Pharmaceutical:Continue  Lovenox- ambulated 45 feet yesterday.              -antiplatelet therapy: ASA/Plavix 3. Pain Management:  Will increase gabapentin to tid to help with phantom pain, continue             --continue  Oxycodone prn. Will add OxyContin bid for more consistent relief--not getting pain meds on time.              - d/c IV Morphine since coming to CIR 4. Mood: LCSW to follow for evaluation and support.              -antipsychotic agents: N/A 5. Neuropsych: This patient is capable of making decisions on his own behalf. 6. Skin/Wound Care: continue wound VAC. Will monitor wound for healing. Discussed plan for d/c 1 week from surgery- 3/18 7. Fluids/Electrolytes/Nutrition: Monitor I/O. Check lytes in am.  8. HTN: Monitor BP tid--on Norvasc, HCTZ and Cozaar.   9. CKD?: Monitor renal status with serial checks.             --K+ on higher end of normal. May need diet adjusted. Recheck labs in am.              --Baseline SCr1.2? 10. T2DM: Hgb A1C-7.7. Monitor BS ac/hs and use SSI for elevated BS. Titrate  insulin as needed.              --  Continue Jardiance, metformin (SCr borderline at 1.33), Levemir with 4 units tid ac for meal coverage.  11. Hyponatremia: Question due to acute on chronic renal failure. Na 134 on 3/16, repeat tomorrow.  12. CAD s/p PTCA: Monitor for symptoms with increase in activity.             --On DAPT, Imdur, Cozaar and Jardiance.  13. Constipation: Will add Miralax bid for acute on chronic constipation.  - might benefit from Sorbitol- LBM 4 days ago 14. Hypokalemia: resolved 15. Anemia: Hgb 11.4 today, repeat Monday.  16. AKI: Cr up to 1.25- encourage hydration- repeat tomorrow.    LOS: 2 days A FACE TO FACE EVALUATION WAS PERFORMED  Keith Espinoza P Kandas Oliveto 12/18/2020, 11:24 AM

## 2020-12-18 NOTE — Progress Notes (Signed)
Occupational Therapy Session Note  Patient Details  Name: Keith Espinoza MRN: [id number] Date of Birth: 08/11/1941  Today's Date: 12/18/2020 OT Individual Time: 53-1400 OT Individual Time Calculation (min): 58 min    Short Term Goals: Week 1:  OT Short Term Goal 1 (Week 1): STGs = LTGs d/t ELOS at Mod I level  Skilled Therapeutic Interventions/Progress Updates:    Pt received supine in bed, agreeable to therapy. Reports 3/10 pain in R residual limb. Reports concern about wife in ICU, but agreeable to therapy. Donned/doffed limb guard max A bed level. Squat-pivot to and from w/c with CGA, pt able to assist with w/c management . Self-propelled self to and from therapy gym with close S and light min A for doorway management. Pt able to complete STS and hop ~25 ft with light CGA. Additionally, practiced kitchen navigation with both w/c and RW. Pt able to navigate kitchen more efficiently with RW, but requiring CGA at times when bending to retrieve items from fridge. Reviewed energy conservation/balance techniques for light meal prep (reach for items with LUE to counterbalance R BKA and to have freq used items on kitchen counter). Squat-pivot back to bed same manner as before and sitting EOB > supine with close S. Additionally reviewed desensitization techniques to perform to residual limb, pt verbalized understanding.  Pt left semi-reclined in bed with bed alarm engaged, call bell in reach, and all immediate needs met.    Therapy Documentation Precautions:  Precautions Precautions: Fall Precaution Comments: R BKA Required Braces or Orthoses: Other Brace Other Brace: residual limb guard if able to tolerate Restrictions Weight Bearing Restrictions: Yes RLE Weight Bearing: Non weight bearing Pain: Pain Assessment Pain Scale: 0-10 Pain Score: 3  Pain Type: Surgical pain Pain Location: Leg Pain Orientation: Right Pain Descriptors / Indicators: Aching;Throbbing Pain Frequency:  Constant Pain Onset: On-going Patients Stated Pain Goal: 2 Pain Intervention(s): Medication (See eMAR);Repositioned ADL: See Care Tool for more details.  Therapy/Group: Individual Therapy  Volanda Napoleon MS, OTR/L  12/18/2020, 2:48 PM

## 2020-12-19 LAB — BASIC METABOLIC PANEL
Anion gap: 10 (ref 5–15)
BUN: 28 mg/dL — ABNORMAL HIGH (ref 8–23)
CO2: 29 mmol/L (ref 22–32)
Calcium: 9.5 mg/dL (ref 8.9–10.3)
Chloride: 91 mmol/L — ABNORMAL LOW (ref 98–111)
Creatinine, Ser: 1.44 mg/dL — ABNORMAL HIGH (ref 0.61–1.24)
GFR, Estimated: 49 mL/min — ABNORMAL LOW (ref >60)
Glucose, Bld: 304 mg/dL — ABNORMAL HIGH (ref 70–99)
Potassium: 3.7 mmol/L (ref 3.5–5.1)
Sodium: 130 mmol/L — ABNORMAL LOW (ref 135–145)

## 2020-12-19 LAB — GLUCOSE, CAPILLARY
Glucose-Capillary: 292 mg/dL — ABNORMAL HIGH (ref 70–99)
Glucose-Capillary: 84 mg/dL (ref 70–99)
Glucose-Capillary: 296 mg/dL — ABNORMAL HIGH (ref 70–99)
Glucose-Capillary: 336 mg/dL — ABNORMAL HIGH (ref 70–99)

## 2020-12-19 MED ORDER — GLUCERNA SHAKE PO LIQD
237.0000 mL | Freq: Three times a day (TID) | ORAL | Status: DC
  Administered 2020-12-19 – 2020-12-23 (×10): 237 mL via ORAL

## 2020-12-19 MED ORDER — PROSOURCE PLUS PO LIQD
30.0000 mL | Freq: Two times a day (BID) | ORAL | Status: DC
  Administered 2020-12-19 – 2020-12-22 (×6): 30 mL via ORAL
  Filled 2020-12-19 (×6): qty 30

## 2020-12-19 NOTE — Progress Notes (Addendum)
PROGRESS NOTE   Subjective/Complaints: Labs refused this morning as per patient's request Patient's chart reviewed- No issues reported overnight Vitals signs stable Wound vac may be d/ced today  ROS: Pain has been well controlled.   Objective:   No results found. Recent Labs    12/17/20 0118  WBC 8.6  HGB 11.4*  HCT 31.7*  PLT 179   Recent Labs    12/17/20 0118 12/18/20 0134  NA 134* 132*  K 3.2* 4.2  CL 95* 93*  CO2 29 27  GLUCOSE 127* 198*  BUN 26* 25*  CREATININE 1.09 1.25*  CALCIUM 9.6 9.6    Intake/Output Summary (Last 24 hours) at 12/19/2020 0845 Last data filed at 12/19/2020 0600 Gross per 24 hour  Intake 477 ml  Output 1450 ml  Net -973 ml        Physical Exam: Vital Signs Blood pressure 127/60, pulse 60, temperature 98.5 F (36.9 C), temperature source Oral, resp. rate 16, height 5\' 10"  (1.778 m), weight 67.4 kg, SpO2 98 %. Gen: no distress, normal appearing HEENT: oral mucosa pink and moist, NCAT Cardio: Reg rate Chest: normal effort, normal rate of breathing Abd: soft, non-distended Ext: no edema Psych: pleasant, normal affect Skin: intact Musculoskeletal:     Cervical back: Normal range of motion and neck supple.     Comments: R-BKA with wound VAC in place. LLE without edema or breakdown.  UEs 5/5 in biceps, triceps, WE grip and finger abd B/L LLE- 5/5 in HF, KE, DF and PF RLE- HF/KE/ 5/5- however lacking 15-20 degrees  Of R knee extension; can get to 95 degrees knee flexion  Skin:    Comments: Wound VAC on R BKA- no drainage- vacuum in place No buttock wounds IV in L forearm- looks OK  Neurological:     Mental Status: He is alert and oriented to person, place, and time.     Comments: Light touch intact x 3+ extremities   Psychiatric:     Comments: Joking, appropriate- but sad    Assessment/Plan: 1. Functional deficits which require 3+ hours per day of interdisciplinary  therapy in a comprehensive inpatient rehab setting.  Physiatrist is providing close team supervision and 24 hour management of active medical problems listed below.  Physiatrist and rehab team continue to assess barriers to discharge/monitor patient progress toward functional and medical goals  Care Tool:  Bathing    Body parts bathed by patient: Right arm,Left arm,Chest,Abdomen,Front perineal area,Right upper leg,Left upper leg,Face   Body parts bathed by helper: Buttocks,Left lower leg Body parts n/a: Right lower leg   Bathing assist Assist Level: Minimal Assistance - Patient > 75%     Upper Body Dressing/Undressing Upper body dressing   What is the patient wearing?: Hospital gown only    Upper body assist Assist Level: Minimal Assistance - Patient > 75%    Lower Body Dressing/Undressing Lower body dressing      What is the patient wearing?: Hospital gown only     Lower body assist Assist for lower body dressing: Minimal Assistance - Patient > 75%     Toileting Toileting    Toileting assist Assist for toileting: Independent with assistive  device Assistive Device Comment: urinal   Transfers Chair/bed transfer  Transfers assist     Chair/bed transfer assist level: Contact Guard/Touching assist     Locomotion Ambulation   Ambulation assist      Assist level: Minimal Assistance - Patient > 75% Assistive device: Walker-rolling Max distance: 39ft   Walk 10 feet activity   Assist     Assist level: Minimal Assistance - Patient > 75% Assistive device: Walker-rolling   Walk 50 feet activity   Assist Walk 50 feet with 2 turns activity did not occur: Safety/medical concerns         Walk 150 feet activity   Assist Walk 150 feet activity did not occur: Safety/medical concerns         Walk 10 feet on uneven surface  activity   Assist Walk 10 feet on uneven surfaces activity did not occur: Safety/medical concerns          Wheelchair     Assist Will patient use wheelchair at discharge?: Yes Type of Wheelchair: Manual    Wheelchair assist level: Supervision/Verbal cueing Max wheelchair distance: 200    Wheelchair 50 feet with 2 turns activity    Assist        Assist Level: Supervision/Verbal cueing   Wheelchair 150 feet activity     Assist      Assist Level: Supervision/Verbal cueing   Blood pressure 127/60, pulse 60, temperature 98.5 F (36.9 C), temperature source Oral, resp. rate 16, height 5\' 10"  (1.778 m), weight 67.4 kg, SpO2 98 %.  Medical Problem List and Plan: 1.  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  Antithrombotics: -DVT/anticoagulation:  Pharmaceutical:Continue Lovenox- ambulated 50 feet yesterday.              -antiplatelet therapy: ASA/Plavix 3. Pain Management:  Will increase gabapentin to tid to help with phantom pain, continue             - d/c OxyContin (patient agreeable)- only requiring IR oxycodone once per day 4. Mood: LCSW to follow for evaluation and support.              -antipsychotic agents: N/A 5. Neuropsych: This patient is capable of making decisions on his own behalf. 6. Skin/Wound Care: d.c wound vac 3/18 7. Fluids/Electrolytes/Nutrition: Monitor I/O. Check lytes in am.  8. HTN: Monitor BP tid--on Norvasc, HCTZ and Cozaar.   9. AKI: Monitor renal status with serial checks.  Cr 1.25 1.5 on 3/17, ordered repeat for today but patient refused. Encourage hydration and repeat Monday 10. T2DM: Hgb A1C-7.7. Monitor BS ac/hs and use SSI for elevated BS. Titrate insulin as needed.              --Continue Jardiance, metformin (SCr borderline at 1.33), Levemir with 4 units tid ac for meal coverage.  11. Hyponatremia: Question due to acute on chronic renal  failure. Na 134 on 3/16, repeat tomorrow.  12. CAD s/p PTCA: Monitor for symptoms with increase in activity.             --On DAPT, Imdur, Cozaar and Jardiance.  13. Constipation: Will add Miralax bid for acute on chronic constipation.  -  might benefit from Sorbitol- LBM 4 days ago 14. Hypokalemia: resolved 15. Anemia: Hgb 11.4 today, repeat Monday.  16. Disposition: Lives with wife who is currently in ICU. Was independent prior to admission. Advised to scheduled caregiver training with his granddaughter. HFU w/ me 4/14 9:20am. Does not have PCP.    LOS: 3 days A FACE TO FACE EVALUATION WAS PERFORMED  Martha Clan P Kamylle Axelson 12/19/2020, 8:45 AM

## 2020-12-19 NOTE — Progress Notes (Signed)
Initial Nutrition Assessment  RD working remotely.  DOCUMENTATION CODES:   Not applicable  INTERVENTION:   - If PO intake remains inconsistent, consider liberalizing diet to Regular  - Glucerna Shake po TID, each supplement provides 220 kcal and 10 grams of protein  - ProSource Plus 30 ml po BID, each supplement provides 100 kcal and 15 grams of protein  - Magic Cup TID with meals, each supplement provides 290 kcal and 9 grams of protein  - Encourage adequate PO intake  NUTRITION DIAGNOSIS:   Increased nutrient needs related to wound healing as evidenced by estimated needs.  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 Pt admitted to acute care on 12/12/20 for right BKA. Admitted to CIR on 3/15.   Noted plan for wound VAC to be removed today.  RD was approached yesterday by RN reporting pt with poor PO intake due to dislike of hospital food. Per RN, pt really wants salmon but this is not available on the [employer] menu. Discussed with Patient Risk analyst who will make allowances for pt to receive salmon when desired.  RD was unable to reach pt today via phone call to room. Will attempt to obtain diet and weight history at follow-up. Reviewed available weight history in chart. Weight on 12/12/20 (date of right BKA) was 72.6 kg. Current weight is 67.4 kg. Drop in weight is to be expected with BKA; however, will continue to monitor weight trends while admitted to CIR as it appears pt's weight has been steadily  declining since June 2021. Suspect some degree of malnutrition but unable to confirm without NFPE.  RD will order oral nutrition supplements to aid pt in meeting kcal and protein needs and to promote wound healing. If PO intake remains inconsistent, recommend liberalizing diet to Regular.  Meal Completion: 0-100% x last 8 meals (averaging 43%)  Medications reviewed and include: cholecalciferol, jardiance, SSI, novolog 3 units TID with meals (if pt consumes 50% of meal), levemir 10 units daily, metformin, miralax, vitamin B-12  Labs reviewed: sodium 132, BUN 25, creatinine 1.25 CBG's: 159-292 x 24 hours  UOP: 1450 ml x 24 hours VAC: 0 ml x 24 hours I/O's: -2.0 L since admit  NUTRITION - FOCUSED PHYSICAL EXAM:  Unable to complete at this time. RD working remotely.  Diet Order:   Diet Order            Diet Carb Modified Fluid consistency: Thin; Room service appropriate? Yes  Diet effective now                 EDUCATION NEEDS:   Not appropriate for education at this time   Skin:  Skin Assessment: Skin Integrity Issues: Skin Integrity Issues: Wound VAC: right leg s/p BKA  Last BM:  12/17/20 large type 5  Height:   Ht Readings from Last 1 Encounters:  12/16/20 5\' 10"  (1.778 m)    Weight:   Wt Readings from Last 1 Encounters:  12/16/20 67.4 kg    BMI:  Body mass index is 21.32 kg/m.  Estimated Nutritional  Needs:   Kcal:  1800-2000  Protein:  85-100 grams  Fluid:  1.8-2.0 L    Gustavus Bryant, MS, RD, LDN Inpatient Clinical Dietitian Please see AMiON for contact information.

## 2020-12-19 NOTE — IPOC Note (Signed)
Overall Plan of Care (IPOC) Patient Details Name: Keith Espinoza MRN: [id number] DOB: [birth year]  Admitting Diagnosis: Below-knee amputation of right lower extremity [facility])  Hospital Problems: Principal Problem:   Below-knee amputation of right lower extremity ([place])     Functional Problem List: Nursing Pain,Safety,Skin Integrity,Bowel,Edema,Motor,Medication Management  PT Balance,Endurance,Pain,Safety,Sensory  OT Balance,Edema,Endurance,Motor,Pain,Safety,Sensory  SLP    TR         Basic ADL's: OT Grooming,Bathing,Dressing,Toileting     Advanced  ADL's: OT Simple Meal Preparation     Transfers: PT Bed Mobility,Bed to [place]  OT Toilet,Tub/Shower     Locomotion: PT Ambulation,Wheelchair Mobility,Stairs     Additional Impairments: OT None  SLP        TR      Anticipated Outcomes Item Anticipated Outcome  Self Feeding no goal  Swallowing      Basic self-care  Mod I  Toileting  Mod I   Bathroom Transfers Mod I  Bowel/Bladder  Manage bowel and bladder with Mod I assist  Transfers  mod I  Locomotion  mod I with LRAD  Communication     Cognition     Pain  Pain less than 4 on 0-10 scale  Safety/Judgment  Remain free of injury and prevent falls with cues and reminders   Therapy Plan: PT Intensity: Minimum of 1-2 x/day ,45 to 90 minutes PT Frequency: 5 out of 7 days PT Duration Estimated Length of Stay: 7-10 days OT Intensity: Minimum of 1-2 x/day, 45 to 90 minutes OT Frequency: 5 out of 7 days OT Duration/Estimated Length of Stay: 7-10 days     Due to the current state of emergency, patients may not be receiving their 3-hours of Medicare-mandated therapy.   Team Interventions: Nursing Interventions Patient/Family Education,Skin Care/Wound Management,Pain Management,Medication Management,Bowel Management,Discharge Planning,Psychosocial Support,Disease Management/Prevention  PT interventions Ambulation/gait  training,Balance/vestibular training,Community reintegration,Discharge planning,Disease management/prevention,DME/adaptive equipment instruction,Functional mobility training,Neuromuscular re-education,Pain management,Patient/family education,Psychosocial support,Splinting/orthotics,Therapeutic Activities,Therapeutic Exercise,UE/LE Strength taining/ROM,UE/LE Museum/gallery conservator propulsion/positioning  OT Interventions Balance/vestibular training,Discharge planning,Pain management,Self Care/advanced ADL retraining,Therapeutic Activities,UE/LE Coordination activities,Visual/perceptual remediation/compensation,Therapeutic Exercise,Skin care/wound managment,Patient/family education,Functional mobility training,Disease mangement/prevention,Cognitive remediation/compensation,Community Corporate treasurer re-education,Psychosocial support,Splinting/orthotics,UE/LE Strength taining/ROM,Wheelchair propulsion/positioning  SLP Interventions    TR Interventions    SW/CM Interventions Discharge Planning,Psychosocial Support,Patient/Family Education   Barriers to Discharge MD  Medical stability  Nursing Inaccessible home environment,Decreased caregiver support,Incontinence,Wound Care,Lack of/limited family support,Weight bearing restrictions,Medication compliance,Behavior    PT Decreased caregiver support,Lack of/limited family support,Weight bearing restrictions,Other (comments) wife currently in ICU  OT      SLP      SW       Team Discharge Planning: Destination: PT-Home ,OT- Home , SLP-  Projected Follow-up: PT-Home health PT, OT-  Home health OT, SLP-  Projected Equipment Needs: PT-Rolling walker with 5" wheels,Wheelchair (measurements),Wheelchair cushion (measurements), OT- To be determined, SLP-  Equipment Details: PT- , OT-already has shower seat and BSC Patient/family involved in discharge planning: PT- Patient,  OT-Patient, SLP-   MD ELOS:  min A to supervision 12-16 days Medical Rehab Prognosis:  Excellent Assessment: Keith Espinoza is an 80 year old man admitted to CIR with R BKA secondary to foul smelling wound in his right foot, requiring wound vac, to be removed to day. Course complicated by phantom pain and residual limb pain. He is on Lovenox given limited ambulation. Gabapentin has been increased and OxyContin added for better pain control. Other conditions being managed include HTN, CKD, T2DM, hyponatremia, constipation, hypokalemia, and anemia.    See Team Conference Notes for weekly updates to the  plan of care

## 2020-12-19 NOTE — Progress Notes (Signed)
Inpatient Diabetes Program Recommendations  AACE/ADA: New Consensus Statement on Inpatient Glycemic Control (2015)  Target Ranges:  Prepandial:   less than 140 mg/dL      Peak postprandial:   less than 180 mg/dL (1-2 hours)      Critically ill patients:  140 - 180 mg/dL   Lab Results  Component Value Date   GLUCAP 84 12/19/2020   HGBA1C 7.7 (H) 12/12/2020    Review of Glycemic Control  Diabetes history: DM 2 Outpatient Diabetes medications: Levemir 12-20 units Daily, Metformin 1000 mg BID, Jardiance 25 mg QD Current orders for Inpatient glycemic control:  Levemir 10 units QHS Novolog 0-15 units tid Novolog 4 units tid meal coverage Metformin 1000 mg bid Jardiance 25 mg QD  No meal coverage given for breakfast meal with pt eating 95%.  Inpatient Diabetes Program Recommendations:     Increase Lantus to 14 units QHS  Continue to follow.  Thank you. Lorenda Peck, RD, LDN, CDE Inpatient Diabetes Coordinator [phone]

## 2020-12-19 NOTE — Progress Notes (Signed)
Lab went into room to draw lab. Lab personnel said that patient struck them and was refusing labs. Labs not drawn per patient request.

## 2020-12-19 NOTE — Plan of Care (Signed)
  Problem: Consults Goal: RH LIMB LOSS PATIENT EDUCATION Description: Description: See Patient Education module for eduction specifics. Outcome: Progressing Goal: Nutrition Consult-if indicated Outcome: Progressing Goal: Diabetes Guidelines if Diabetic/Glucose > 140 Description: If diabetic or lab glucose is > 140 mg/dl - Initiate Diabetes/Hyperglycemia Guidelines & Document Interventions  Outcome: Progressing   Problem: RH BOWEL ELIMINATION Goal: RH STG MANAGE BOWEL WITH ASSISTANCE Description: STG Manage Bowel with Mod I Assistance. Outcome: Progressing Goal: RH STG MANAGE BOWEL W/MEDICATION W/ASSISTANCE Description: STG Manage Bowel with Medication with Mod I Assistance. Outcome: Progressing   Problem: RH SKIN INTEGRITY Goal: RH STG ABLE TO PERFORM INCISION/WOUND CARE W/ASSISTANCE Description: STG Able To Perform Incision/Wound Care With Mod I Assistance. Outcome: Progressing   Problem: RH SAFETY Goal: RH STG ADHERE TO SAFETY PRECAUTIONS W/ASSISTANCE/DEVICE Description: STG Adhere to Safety Precautions With cues and reminders. Outcome: Progressing   Problem: RH PAIN MANAGEMENT Goal: RH STG PAIN MANAGED AT OR BELOW PT'S PAIN GOAL Description: Pain less than 4 on 0-10 scale Outcome: Progressing   Problem: RH KNOWLEDGE DEFICIT LIMB LOSS Goal: RH STG INCREASE KNOWLEDGE OF SELF CARE AFTER LIMB LOSS Description: Patient will demonstrate proper dressing change techniques and be able to identify symptoms of surgical site infection with Mod I assist.   Patient will demonstrate understanding of medication regimen, dietary and lifestyle modifications with Mod I assist using resources provided.   Outcome: Progressing

## 2020-12-19 NOTE — Progress Notes (Signed)
Physical Therapy Session Note  Patient Details  Name: Keith Espinoza MRN: [id number] Date of Birth: 06/17/1941  Today's Date: 12/19/2020 PT Individual Time: 0800-0912 PT Individual Time Calculation (min): 72 min   Short Term Goals: Week 1:  PT Short Term Goal 1 (Week 1): =LTG due to ELOS  Skilled Therapeutic Interventions/Progress Updates:   Received pt supine in bed, pt agreeable to therapy, and reported mild pain in R residual limb but did not state level (premedicated). Repositioning, rest breaks, and distraction done to reduce pain levels. Session with emphasis on functional mobility/transfers, generalized strengthening, dynamic sitting balance/coordination, simulated car transfers, toileting, and improved activity tolerance. Donned brief with max A and shorts with min A and pt transferred supine<>sitting EOB mod I. Squat<>pivot with CGA. Pt required min cues (for placement of WC and to lock brakes) to set up all transfers throughout session. Pt performed WC mobility 2ft x 2 trials using BUEs and supervision onto elevator and transported remainder of way to 30M ortho gym in [facility] total A due to UE fatigue. Pt performed simulated car transfer with CGA using squat<>pivot method. Pt required cues for safe entry, transfer technique, and hand placement. Therapist switched out 18x18 manual WC for 16x16 for improved fit/comfort. Squat<>pivot WC<>Nustep with CGA and pt performed BUE and LLE strengthening on Nustep at workload 4 for 8 minutes for a total of 204 steps for improved cardiovascular endurance; limited by BUE pain from previous RTC tears. Pt transported back to room in [facility] total A and requested to toilet. Stand<>pivot WC<>toilet with bedside commode over top with min A using grab bar and pt required total A to doff brief/pants. Pt able void and perform peri-care with supervision. Squat<>pivot WC<>bed with CGA in same manner. Concluded session with pt sitting EOB, needs within reach, and bed alarm on .    Therapy Documentation Precautions:  Precautions Precautions: Fall Precaution Comments: R BKA Required Braces or Orthoses: Other Brace Other Brace: residual limb guard if able to tolerate Restrictions Weight Bearing Restrictions: Yes RLE Weight Bearing: Non weight bearing  Therapy/Group: Individual Therapy Alfonse Alpers PT, DPT   12/19/2020, 7:15 AM

## 2020-12-19 NOTE — Progress Notes (Signed)
Occupational Therapy Session Note  Patient Details  Name: Keith Espinoza MRN: [id number] Date of Birth: 1940/12/08  Today's Date: 12/19/2020 OT Individual Time: 5366-4403 and 4742-5956 OT Individual Time Calculation (min): 72 min and 42 min (missed 18 mins due to fatigue/medication)   Short Term Goals: Week 1:  OT Short Term Goal 1 (Week 1): STGs = LTGs d/t ELOS at Mod I level  Skilled Therapeutic Interventions/Progress Updates:    1) Treatment session with focus on functional transfers, BUE strengthening and endurance, and care for residual limb.  Pt received supine in bed asleep but easily awakened. Pt reports fatigue but agreeable to therapy session.  Pt declined any bathing or dressing this AM.  Completed bed mobility with min assist and squat pivot transfer CGA.  Pt propelled w/c ~100' with min cues due to tendency to drift L.  Engaged in [place] with 4# dowel, completing 2 sets of chest chest presses and overhead presses. Therapist provided pt with First Step publication and directed his attention to pain management, limb care, and personal stories.  Therapist discussed pain management strategies with pt reporting not having a lot of pain right now.  Therapist issued pt inspection mirror and educated on importance of regular limb inspection and notifying MD of any areas of concern.  Pt noticed another pt in therapy gym with amputation.  Pt greeted and introduced himself to other pt and engaged in dialogue about amputation and current experience.  Therapist educated pt on [place] and Peer Support options as needed.  Pt returned to room and transferred back to bed min assist due to fatigue.  2) Treatment session with focus on education on care for residual limb, DME, and d/c planning.  Pt's daughter and granddaughter present upon arrival.  Pt received upright in w/c finishing ice cream and having just had wound vac removed.  Pt "nodding off" during session due to pain meds,  per family report.  Pt with difficulty maintaining arousal.  Therefore educated pt and pt's family on First Step publication in regards to pain management, wound care, and limb inspection.  Pt's daughter asking questions about his goals, d/c level, and typical routine for prosthesis.  Therapist answered questions to pt and pt's daughter's satisfaction.  Pt able to recall education from AM session in regards to limb inspection with mirror and recommendations in regards to typical routine and care for residual limb.  Discussed HHOT initially with recommendation for OP services when ready for prosthesis.  Pt again nodding off, therefore set up for transfer back to bed requiring min assist due to decreased sequencing (?medication vs fatigue).  Discussed DME with pt and family, recommend drop arm BSC for bedside as pt worried about having to get up at night time to toilet.  Pt and pt's daughter in agreement of recommendation.  Pt remained semi-reclined in bed due to fatigue and inability to maintain arousal to engage in therapy session. Missed 18 mins  Therapy Documentation Precautions:  Precautions Precautions: Fall Precaution Comments: R BKA Required Braces or Orthoses: Other Brace Other Brace: residual limb guard if able to tolerate Restrictions Weight Bearing Restrictions: Yes RLE Weight Bearing: Non weight bearing General:   Vital Signs: Therapy Vitals Pulse Rate: 83 Resp: 17 BP: (!) 92/57 (nurse notified) Patient Position (if appropriate): Sitting Oxygen Therapy SpO2: 97 % O2 Device: Room Air Pain: Pain Assessment Pain Scale: 0-10 Pain Score: 4  Faces Pain Scale: Hurts a little bit Pain Type: Acute pain;Surgical pain Pain Location:  Leg Pain Orientation: Right Pain Descriptors / Indicators: Throbbing Pain Frequency: Constant Pain Onset: On-going Patients Stated Pain Goal: 2 Pain Intervention(s): Medication (See eMAR);Pain med given for lower pain score than stated, per patient  request (oxycodone given prior to vac removal)   Therapy/Group: Individual Therapy  Simonne Come 12/19/2020, 3:24 PM

## 2020-12-20 DIAGNOSIS — I739 Peripheral vascular disease, unspecified: Secondary | ICD-10-CM

## 2020-12-20 DIAGNOSIS — E119 Type 2 diabetes mellitus without complications: Secondary | ICD-10-CM

## 2020-12-20 DIAGNOSIS — E871 Hypo-osmolality and hyponatremia: Secondary | ICD-10-CM

## 2020-12-20 LAB — GLUCOSE, CAPILLARY
Glucose-Capillary: 184 mg/dL — ABNORMAL HIGH (ref 70–99)
Glucose-Capillary: 153 mg/dL — ABNORMAL HIGH (ref 70–99)
Glucose-Capillary: 250 mg/dL — ABNORMAL HIGH (ref 70–99)
Glucose-Capillary: 209 mg/dL — ABNORMAL HIGH (ref 70–99)

## 2020-12-20 LAB — BASIC METABOLIC PANEL
Anion gap: 8 (ref 5–15)
BUN: 25 mg/dL — ABNORMAL HIGH (ref 8–23)
CO2: 30 mmol/L (ref 22–32)
Calcium: 9.5 mg/dL (ref 8.9–10.3)
Chloride: 95 mmol/L — ABNORMAL LOW (ref 98–111)
Creatinine, Ser: 1.13 mg/dL (ref 0.61–1.24)
GFR, Estimated: >60 mL/min (ref >60)
Glucose, Bld: 143 mg/dL — ABNORMAL HIGH (ref 70–99)
Potassium: 3.4 mmol/L — ABNORMAL LOW (ref 3.5–5.1)
Sodium: 133 mmol/L — ABNORMAL LOW (ref 135–145)

## 2020-12-20 MED ORDER — INSULIN DETEMIR 100 UNIT/ML ~~LOC~~ SOLN
14.0000 [IU] | Freq: Every day | SUBCUTANEOUS | Status: DC
  Administered 2020-12-20 – 2020-12-22 (×3): 14 [IU] via SUBCUTANEOUS
  Filled 2020-12-20 (×4): qty 0.14

## 2020-12-20 NOTE — Progress Notes (Signed)
PROGRESS NOTE   Subjective/Complaints: Had problems sleeping last night. A little restless, some pain.   ROS: Patient denies fever, rash, sore throat, blurred vision, nausea, vomiting, diarrhea, cough, shortness of breath or chest pain,  headache, or mood change.     Objective:   No results found. No results for input(s): WBC, HGB, HCT, PLT in the last 72 hours. Recent Labs    12/19/20 1843 12/20/20 0505  NA 130* 133*  K 3.7 3.4*  CL 91* 95*  CO2 29 30  GLUCOSE 304* 143*  BUN 28* 25*  CREATININE 1.44* 1.13  CALCIUM 9.5 9.5    Intake/Output Summary (Last 24 hours) at 12/20/2020 1524 Last data filed at 12/20/2020 0706 Gross per 24 hour  Intake 300 ml  Output 825 ml  Net -525 ml        Physical Exam: Vital Signs Blood pressure (!) 96/58, pulse 76, temperature 98.2 F (36.8 C), temperature source Oral, resp. rate 18, height 5\' 10"  (1.778 m), weight 67.4 kg, SpO2 94 %. Constitutional: No distress . Vital signs reviewed. HEENT: EOMI, oral membranes moist Neck: supple Cardiovascular: RRR without murmur. No JVD    Respiratory/Chest: CTA Bilaterally without wheezes or rales. Normal effort    GI/Abdomen: BS +, non-tender, non-distended Ext: no clubbing, cyanosis, or edema Psych: pleasant and cooperative Musculoskeletal:     Cervical back: Normal range of motion and neck supple.     Comments: R-BKA with pain, edema. UEs 5/5 in biceps, triceps, WE grip and finger abd B/L LLE- 5/5 in HF, KE, DF and PF RLE- HF/KE/ 5/5- however lacking 15-20 degrees  Of R knee extension; can get to 95 degrees knee flexion  Skin:    Comments: Right BK wound CI with mild dc/  Neurological:     Mental Status: He is alert and oriented to person, place, and time. Normal CN's    Comments: Light touch intact x 3+ extremities     Assessment/Plan: 1. Functional deficits which require 3+ hours per day of interdisciplinary therapy in a  comprehensive inpatient rehab setting.  Physiatrist is providing close team supervision and 24 hour management of active medical problems listed below.  Physiatrist and rehab team continue to assess barriers to discharge/monitor patient progress toward functional and medical goals  Care Tool:  Bathing    Body parts bathed by patient: Right arm,Left arm,Chest,Abdomen,Front perineal area,Right upper leg,Left upper leg,Face   Body parts bathed by helper: Buttocks,Left lower leg Body parts n/a: Right lower leg   Bathing assist Assist Level: Minimal Assistance - Patient > 75%     Upper Body Dressing/Undressing Upper body dressing   What is the patient wearing?: Hospital gown only    Upper body assist Assist Level: Minimal Assistance - Patient > 75%    Lower Body Dressing/Undressing Lower body dressing      What is the patient wearing?: Hospital gown only     Lower body assist Assist for lower body dressing: Minimal Assistance - Patient > 75%     Toileting Toileting    Toileting assist Assist for toileting: Independent with assistive device Assistive Device Comment: urinal   Transfers Chair/bed transfer  Transfers assist  Chair/bed transfer assist level: Contact Guard/Touching assist     Locomotion Ambulation   Ambulation assist      Assist level: Minimal Assistance - Patient > 75% Assistive device: Walker-rolling Max distance: 74ft   Walk 10 feet activity   Assist     Assist level: Minimal Assistance - Patient > 75% Assistive device: Walker-rolling   Walk 50 feet activity   Assist Walk 50 feet with 2 turns activity did not occur: Safety/medical concerns         Walk 150 feet activity   Assist Walk 150 feet activity did not occur: Safety/medical concerns         Walk 10 feet on uneven surface  activity   Assist Walk 10 feet on uneven surfaces activity did not occur: Safety/medical concerns          Wheelchair     Assist Will patient use wheelchair at discharge?: Yes Type of Wheelchair: Manual    Wheelchair assist level: Supervision/Verbal cueing Max wheelchair distance: 200    Wheelchair 50 feet with 2 turns activity    Assist        Assist Level: Supervision/Verbal cueing   Wheelchair 150 feet activity     Assist      Assist Level: Supervision/Verbal cueing   Blood pressure (!) 96/58, pulse 76, temperature 98.2 F (36.8 C), temperature source Oral, resp. rate 18, height 5\' 10"  (1.778 m), weight 67.4 kg, SpO2 94 %.  Medical Problem List and Plan: 1.  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  Antithrombotics: -DVT/anticoagulation:  Pharmaceutical:Continue Lovenox- ambulated 50 feet yesterday.              -antiplatelet therapy: ASA/Plavix 3. Pain Management:   gabapentin to tid to help with phantom pain, continue             - off oxy CR  - only requiring IR oxycodone once per day 4. Mood: LCSW to follow for evaluation and support.              -antipsychotic agents: N/A 5. Neuropsych: This patient is capable of making decisions on his own behalf. 6. Skin/Wound Care: wound vac off  -continue local wound care 7. Fluids/Electrolytes/Nutrition: Monitor I/O. Check lytes in am.  8. HTN: Monitor BP tid--on Norvasc, HCTZ and Cozaar.   9. AKI: Monitor renal status with serial checks.  Cr 1.25 1.5 on 3/17, ordered repeat for today but patient refused. Encourage hydration and repeat Monday 10. T2DM: Hgb A1C-7.7. Monitor BS ac/hs and use SSI for elevated BS.  -CBG's labile              --Jardiance, metformin (SCr borderline at 1.33), Levemir 10u qhs with 4 units tid ac for meal coverage.    -increase levemir to 14u at bedtime 11. Hyponatremia with hypovolemia:   sodium back up to 133  -continue to encourage appropriate fluids  -recheck bmet on Monday 12. CAD s/p PTCA: Monitor for symptoms with increase in activity.             --On DAPT, Imdur, Cozaar and Jardiance.  13. Constipation: Will add Miralax bid for acute on chronic constipation.  -  might benefit from Sorbitol- LBM 4 days ago 14. Hypokalemia: mild : supplement 15. Anemia: Hgb 11.4 today, repeat Monday.  16. Disposition: Lives with wife who is currently in ICU. Was independent prior to admission. Advised to scheduled caregiver training with his granddaughter. HFU w/ me 4/14 9:20am. Does not have PCP.    LOS: 4 days A FACE TO FACE EVALUATION WAS PERFORMED  Meredith Staggers 12/20/2020, 3:24 PM

## 2020-12-21 LAB — BASIC METABOLIC PANEL
Anion gap: 9 (ref 5–15)
BUN: 24 mg/dL — ABNORMAL HIGH (ref 8–23)
CO2: 32 mmol/L (ref 22–32)
Calcium: 9.7 mg/dL (ref 8.9–10.3)
Chloride: 92 mmol/L — ABNORMAL LOW (ref 98–111)
Creatinine, Ser: 1.03 mg/dL (ref 0.61–1.24)
GFR, Estimated: >60 mL/min (ref >60)
Glucose, Bld: 116 mg/dL — ABNORMAL HIGH (ref 70–99)
Potassium: 3.4 mmol/L — ABNORMAL LOW (ref 3.5–5.1)
Sodium: 133 mmol/L — ABNORMAL LOW (ref 135–145)

## 2020-12-21 LAB — GLUCOSE, CAPILLARY
Glucose-Capillary: 117 mg/dL — ABNORMAL HIGH (ref 70–99)
Glucose-Capillary: 240 mg/dL — ABNORMAL HIGH (ref 70–99)
Glucose-Capillary: 157 mg/dL — ABNORMAL HIGH (ref 70–99)
Glucose-Capillary: 172 mg/dL — ABNORMAL HIGH (ref 70–99)

## 2020-12-21 NOTE — Progress Notes (Signed)
Occupational Therapy Session Note  Patient Details  Name: Keith Espinoza MRN: [id number] Date of Birth: 1941/03/27  Today's Date: 12/21/2020 OT Individual Time: [phone] and 7062-3762 OT Individual Time Calculation (min): 54 min and 44 min   Short Term Goals: Week 1:  OT Short Term Goal 1 (Week 1): STGs = LTGs d/t ELOS at Mod I level   Skilled Therapeutic Interventions/Progress Updates:    Session 1: Pt greeted at time of session supine in bed resting but agreeable to OT session. Declined bathing, dressing, and toileting at first. Noted to be laying in bed with RLE over edge of bed, knee flexed. Supine > sit Supervision with rail, pt stating he has adjustable bed at home with rail. Squat pivot bed > wheelchair with S/CGA cues for hand placement. Focused on pt ed for knee extension, positioning at bed level, and noted hip internal rotation with PROM to knee within tolerance and placement on amputee pad on w/c. Self propel to sink, Min A for maneuvering sink. When going to brush teeth, needed to toilet urgently. Stand pivot w/c <> BSC over toilet with CGA, pt using doorway and encouraged to use handles instead but said he has grab bar at that place at home. Also said they are having ramp installed so he can get in bathroom at home w/o walking. CGA clothing and hygiene. Self propel back to sink and oral hygiene with set up. Squat pivot back to bed CGA, sit > supine Supervision. Focus on PROM again to L knee for proper alignment, pillow positioned under distal aspect of residual limb, not under knee. Discussion with pt throughout session on importance of positioning and knee extension for future ambulation. In bed with alalrm on call bell in reach.   Session 2: Pt greeted at time of session sitting up in wheelchair just finished PT session. No pain. Self propel room > elevator with OT resuming for energy conservation. Transported to gym and performed 2 rounds of BITS standing for 2 rounds each with rest  breaks in between reaching across midline and out of BOS with mild fatigue noted. Discussion throughout session regarding home set up and bathroom problem solving, going to have walk in shower remodeled and recommended room for a seat/bench and grab bar placement. Note pt wearing limb guard throughout session and supporting on amputee pad during breaks for knee extension. Squat pivot wheelchair > bed CGA. Sit > supine same manner alarm on call bell in reach.   Therapy Documentation Precautions:  Precautions Precautions: Fall Precaution Comments: R BKA Required Braces or Orthoses: Other Brace Other Brace: residual limb guard if able to tolerate Restrictions Weight Bearing Restrictions: Yes RLE Weight Bearing: Non weight bearing    Therapy/Group: Individual Therapy  Viona Gilmore 12/21/2020, 8:03 AM

## 2020-12-21 NOTE — Progress Notes (Signed)
Physical Therapy Session Note  Patient Details  Name: Keith Espinoza MRN: [id number] Date of Birth: 20-Feb-1941  Today's Date: 12/21/2020 PT Individual Time: 1000-1100 + 1300-1345 PT Individual Time Calculation (min): 60 min   + 45 min  Short Term Goals: Week 1:  PT Short Term Goal 1 (Week 1): =LTG due to ELOS  Skilled Therapeutic Interventions/Progress Updates:      1st session: Pt greeted sitting EOB with tray table in front of him, agreeable to therapy - no reports of pain. Squat<>pivot transfer with CGA to w/c with cues needed for setupA. Donned limb guard with totalA and needing modA for donning L shoe. He proplled himself from his room, on/off elevators, and to main rehab gym on 85M with supervision. Demo's slow stroke propulsion with BUE's. Squat<>pivot with minA to mat table from w/c - needing cues for w/c parts management to set himself up for transfer (leg rests). Sit>supine with supervision on mat table. Completed the following there-ex: -1x10 RLE SLR -1x10 RLE knee to chest -1x10 RLE SAQ  Supine<> L sidelying with minA. -1x10 Sidelying L hip abduction  - 1x10 sidelying L hip extension  Provided powder board b/w legs while sidelying to complete the following: -2x10 knee-to-chest -2x10 hip extension -1x10 hip extension stretches with gentle holds  His R hip appears to have excessive internal rotation leading to his R knee valgus positioning, may benefit from some hip stretches/mobilizations to assist with this.   Supine<>sit with supervision on mat table. Squat<>pivot with minA back to his w/c from mat table. Transported back upstairs to his room with totalA for time management. He was agreeable to remain sitting in w/c at end of session with needs in reach. Pt requested to visit his wife who is in the ICU - RN made aware of patient's request.   2nd session: Pt received supine in bed with grand-daughters at bedside, pt agreeable to therapy as family is about to leave. Pt  denies pain. Donned R limb guard with totalA for time management and he is able to don his L shoe while seated EOB with setupA. Supine<>sit with supervision without bed features. Squat<>pivot transfer with CGA from EOB to w/c with cues for w/c setup and technique. He continues to require assist for leg rest management. Propelled himself from his room to ortho gym downstairs on 85M, including in/off elevators, >263ft. Provided him with w/c gloves to assist with skin protection (thin skin and bruises easily) and also with grip for propelling w/c - pt with improved confidence and stroke's in w/c with gloves on. Performed car transfer, self-selected stand<>pivot method, requiring minA. Once sitting in chair, educated on squat<>pivot vs stand<>pivot technique and encouraged squat<>pivot for safety unless he's using a RW as he reaches for unsteady car door. Performed squat<>pivot out of the car but needing min/modA due to strong posterior lean - educated on techniques to improve efficiency with squat pivot transfers. Pt then positioned in // bars in w/c. Performed sit<>stand transfer with CGA in // bars - performed static standing tolerance task with BUE support to bars, playing cards (black jack) which pt appeared to enjoy and assisted with distractions to standing. Pt then returned to his room with totalA in w/c back upstairs, he remained seated in w/c with needs in reach and informed of upcoming OT session after this one.   Therapy Documentation Precautions:  Precautions Precautions: Fall Precaution Comments: R BKA Required Braces or Orthoses: Other Brace Other Brace: residual limb guard if able to tolerate  Restrictions Weight Bearing Restrictions: Yes RLE Weight Bearing: Non weight bearing General:    Therapy/Group: Individual Therapy  Alger Simons 12/21/2020, 7:33 AM

## 2020-12-22 ENCOUNTER — Telehealth: Payer: Self-pay | Admitting: Radiology

## 2020-12-22 LAB — CBC
HCT: 34.3 % — ABNORMAL LOW (ref 39.0–52.0)
Hemoglobin: 12.1 g/dL — ABNORMAL LOW (ref 13.0–17.0)
MCH: 30.4 pg (ref 26.0–34.0)
MCHC: 35.3 g/dL (ref 30.0–36.0)
MCV: 86.2 fL (ref 80.0–100.0)
Platelets: 256 10*3/uL (ref 150–400)
RBC: 3.98 MIL/uL — ABNORMAL LOW (ref 4.22–5.81)
RDW: 13.9 % (ref 11.5–15.5)
WBC: 8.5 10*3/uL (ref 4.0–10.5)
nRBC: 0 % (ref 0.0–0.2)

## 2020-12-22 LAB — BASIC METABOLIC PANEL
Anion gap: 9 (ref 5–15)
BUN: 27 mg/dL — ABNORMAL HIGH (ref 8–23)
CO2: 31 mmol/L (ref 22–32)
Calcium: 9.8 mg/dL (ref 8.9–10.3)
Chloride: 94 mmol/L — ABNORMAL LOW (ref 98–111)
Creatinine, Ser: 1.14 mg/dL (ref 0.61–1.24)
GFR, Estimated: >60 mL/min (ref >60)
Glucose, Bld: 129 mg/dL — ABNORMAL HIGH (ref 70–99)
Potassium: 3.6 mmol/L (ref 3.5–5.1)
Sodium: 134 mmol/L — ABNORMAL LOW (ref 135–145)

## 2020-12-22 LAB — GLUCOSE, CAPILLARY
Glucose-Capillary: 155 mg/dL — ABNORMAL HIGH (ref 70–99)
Glucose-Capillary: 155 mg/dL — ABNORMAL HIGH (ref 70–99)
Glucose-Capillary: 151 mg/dL — ABNORMAL HIGH (ref 70–99)

## 2020-12-22 MED ORDER — OXYCODONE HCL 5 MG PO TABS
5.0000 mg | ORAL_TABLET | ORAL | Status: DC | PRN
  Administered 2020-12-23: 5 mg via ORAL
  Filled 2020-12-22: qty 1

## 2020-12-22 NOTE — Progress Notes (Signed)
          Wound examined--patient is developing blister on BKA flap. Erythema with pressure areas on tibial prominence--no drainage but wound was open to air therefore stump srinker replaced. Will contact Dr. Sharol Given for input--question need of antibiotics for wound prophylaxis as some drainage reported or wait for wound to demarcate. CBC ordered.

## 2020-12-22 NOTE — Telephone Encounter (Signed)
Reesa Chew from [facility] called and is requesting Bevely Palmer or Dr. Sharol Given to come by and look at patients incision. She states that under the dressing there appears to be blisters and a small amount of drainage was noted the nurse during the dressing change.  She thinks that he may need some oral antibiotics. Her call back number is [phone]

## 2020-12-22 NOTE — Progress Notes (Signed)
PROGRESS NOTE   Subjective/Complaints: Pain is currently well controlled, but it was painful during therapy Na up to 134 Residual limb with increasing erythema, oozing, and blisters. Dr. Jess Barters team to take a look tomorrow.   ROS: Patient denies fever, rash, sore throat, blurred vision, nausea, vomiting, diarrhea, cough, shortness of breath or chest pain,  headache, or mood change.     Objective:   No results found. Recent Labs    12/22/20 1419  WBC 8.5  HGB 12.1*  HCT 34.3*  PLT 256   Recent Labs    12/21/20 0512 12/22/20 0352  NA 133* 134*  K 3.4* 3.6  CL 92* 94*  CO2 32 31  GLUCOSE 116* 129*  BUN 24* 27*  CREATININE 1.03 1.14  CALCIUM 9.7 9.8    Intake/Output Summary (Last 24 hours) at 12/22/2020 1506 Last data filed at 12/22/2020 1350 Gross per 24 hour  Intake 399 ml  Output 950 ml  Net -551 ml        Physical Exam: Vital Signs Blood pressure 106/67, pulse 94, temperature 98.2 F (36.8 C), temperature source Oral, resp. rate 18, height 5\' 10"  (1.778 m), weight 67.4 kg, SpO2 92 %. Gen: no distress, normal appearing HEENT: oral mucosa pink and moist, NCAT Cardio: Reg rate Chest: normal effort, normal rate of breathing Abd: soft, non-distended Ext: no edema Psych: pleasant, normal affect Skin: intact Musculoskeletal:     Cervical back: Normal range of motion and neck supple.     Comments: R-BKA with pain, edema. UEs 5/5 in biceps, triceps, WE grip and finger abd B/L LLE- 5/5 in HF, KE, DF and PF RLE- HF/KE/ 5/5- however lacking 15-20 degrees  Of R knee extension; can get to 95 degrees knee flexion  Skin:    Comments: Right BK wound CI with mild oozing and increased erythema and tibial blisters.  Neurological:     Mental Status: He is alert and oriented to person, place, and time. Normal CN's    Comments: Light touch intact x 3+ extremities     Assessment/Plan: 1. Functional deficits which  require 3+ hours per day of interdisciplinary therapy in a comprehensive inpatient rehab setting.  Physiatrist is providing close team supervision and 24 hour management of active medical problems listed below.  Physiatrist and rehab team continue to assess barriers to discharge/monitor patient progress toward functional and medical goals  Care Tool:  Bathing    Body parts bathed by patient: Right arm,Left arm,Chest,Abdomen,Front perineal area,Right upper leg,Left upper leg,Face   Body parts bathed by helper: Buttocks,Left lower leg Body parts n/a: Right lower leg   Bathing assist Assist Level: Minimal Assistance - Patient > 75%     Upper Body Dressing/Undressing Upper body dressing   What is the patient wearing?: Hospital gown only    Upper body assist Assist Level: Minimal Assistance - Patient > 75%    Lower Body Dressing/Undressing Lower body dressing      What is the patient wearing?: Hospital gown only     Lower body assist Assist for lower body dressing: Minimal Assistance - Patient > 75%     Toileting Toileting    Toileting assist Assist for  toileting: Contact Guard/Touching assist Assistive Device Comment: urinal   Transfers Chair/bed transfer  Transfers assist     Chair/bed transfer assist level: Contact Guard/Touching assist     Locomotion Ambulation   Ambulation assist      Assist level: Minimal Assistance - Patient > 75% Assistive device: Walker-rolling Max distance: 1ft   Walk 10 feet activity   Assist     Assist level: Minimal Assistance - Patient > 75% Assistive device: Walker-rolling   Walk 50 feet activity   Assist Walk 50 feet with 2 turns activity did not occur: Safety/medical concerns         Walk 150 feet activity   Assist Walk 150 feet activity did not occur: Safety/medical concerns         Walk 10 feet on uneven surface  activity   Assist Walk 10 feet on uneven surfaces activity did not occur:  Safety/medical concerns         Wheelchair     Assist Will patient use wheelchair at discharge?: Yes Type of Wheelchair: Manual    Wheelchair assist level: Supervision/Verbal cueing Max wheelchair distance: 200    Wheelchair 50 feet with 2 turns activity    Assist        Assist Level: Supervision/Verbal cueing   Wheelchair 150 feet activity     Assist      Assist Level: Supervision/Verbal cueing   Blood pressure 106/67, pulse 94, temperature 98.2 F (36.8 C), temperature source Oral, resp. rate 18, height 5\' 10"  (1.778 m), weight 67.4 kg, SpO2 92 %.  Medical Problem List and Plan: 1.  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  Impaired mobility: -DVT/anticoagulation:  Pharmaceutical:Continue Lovenox- ambulated 50 feet              -antiplatelet therapy: ASA/Plavix 3. Pain Management:   gabapentin to tid to help with phantom pain, continue             - off oxy CR  - only requiring IR oxycodone once per day  -decrease oxycodone to 5mg .  4. Mood: LCSW to follow for evaluation and support.              -antipsychotic agents: N/A 5. Neuropsych: This patient is capable of making decisions on his own behalf. 6. Skin/Wound Care: wound vac off  -continue local wound care 7. Fluids/Electrolytes/Nutrition: Monitor I/O. Check lytes in am.  8. HTN: Monitor BP tid--on Norvasc, HCTZ and Cozaar.   9. AKI: Monitor renal status with serial checks.  Cr normalized, repeat outpatient.  10. T2DM: Hgb A1C-7.7. Monitor BS ac/hs and use SSI for elevated BS.  -CBG's labile              --Jardiance, metformin (SCr borderline at 1.33), Levemir 10u qhs with 4 units tid ac for meal coverage.    -increase levemir to 14u at bedtime 11. Hyponatremia with hypovolemia:  sodium back up to  133  -continue to encourage appropriate fluids  -recheck bmet on Monday 12. CAD s/p PTCA: Monitor for symptoms with increase in activity.             --On DAPT, Imdur, Cozaar and Jardiance.  13. Constipation: d/c Miralax as refusing.  14. Hypokalemia: normalized, repeat outpatient.  15. Anemia: Hgb 11.4 today, repeat Monday.  16. Disposition: Lives with wife who is currently in ICU. Was independent prior to admission. Advised to scheduled caregiver training with his granddaughter. HFU w/ me 4/14 9:20am. Does not have PCP.    LOS: 6 days A FACE TO FACE EVALUATION WAS PERFORMED  Keith Espinoza 12/22/2020, 3:06 PM

## 2020-12-22 NOTE — Progress Notes (Signed)
Physical Therapy Session Note  Patient Details  Name: Keith Espinoza MRN: [id number] Date of Birth: [birth year]  Today's Date: 12/22/2020 PT Individual Time: 4818-5631 PT Individual Time Calculation (min): 57 min   Short Term Goals: Week 1:  PT Short Term Goal 1 (Week 1): =LTG due to ELOS  Skilled Therapeutic Interventions/Progress Updates:     Pt greeted supine in bed with R residual limb in extension and pillow under distal limb, promoting extension. Pt agreeable to session but also reports 10/10 R residual limb pain, burning in nature, that is primarily associated with movement. RN notified who arrived shortly thereafter for pain medications. Supine<>sit with supervision with HOB flat, use of bedrail. Donned limb guard with totalA while he sat EOB and limb guard remained on throughout session. Squat<>pivot transfer with minA from EOB to w/c, poor hip clearance and needing assistance for completing end of transfer. He donned w/c gloves and propelled himself from his room to ortho gym, downstairs, with supervision, >370ft. Pt then reporting need to have an urgent BM. Wheeled to bathroom and needing assistance for w/c setup (position's w/c perpendicular to [facility] rather than beside it). He also was beginning to transfer to [facility] prior to lowering the drop arm rests on both w/c and BSC. Completed transfer with minA and needing maxA for lowering his pants. Pt continent of B + B, large loose stool. Pt able to complete posterior pericare. Squat<>pivot back to his w/c with minA and wheeled sinkside for him to wash his hands. With what time was remaining, performed NMR on with BITS - completing Geoboard task. Completed this in standing with RW support, needing minA for standing balance and using his RUE to complete task. He was able to complete Geoboard accuracy 100% without a memory component but with memory component included (5 sec delay), he demonstrated ~50% accuracy. Pt then returned upstairs to his room with  Waterman for time management in his w/c - pt requesting to remain out of bed in the w/c. RN made aware who retrieved chair alarm as there wasn't one in his room. Needs within reach at end of session.  Therapy Documentation Precautions:  Precautions Precautions: Fall Precaution Comments: R BKA Required Braces or Orthoses: Other Brace Other Brace: residual limb guard if able to tolerate Restrictions Weight Bearing Restrictions: Yes RLE Weight Bearing: Touchdown weight bearing General:     Therapy/Group: Individual Therapy  [name] P Gokul Waybright PT 12/22/2020, 7:52 AM

## 2020-12-22 NOTE — Progress Notes (Signed)
Patient ID: Keith Espinoza, male   DOB: October 10, 1940, 80 y.o.   MRN: [id number] Met wit pt to check to see how doing, he voiced glad the wound vac is off and now has stump shrinker on. He voiced his wife is not eating but wants to get home. He states: " She has a long way to go before getting out of here."  He is looking forward to team conference tomorrow to see his target discharge date.

## 2020-12-22 NOTE — Progress Notes (Signed)
Patient's right stump is red, warm to touch and has small amount of drainage noted.  MD made aware.

## 2020-12-22 NOTE — Telephone Encounter (Signed)
Pt is s/p a BKA 12/12/20 please see request for PA below.

## 2020-12-22 NOTE — Progress Notes (Signed)
Occupational Therapy Session Note  Patient Details  Name: Keith Espinoza MRN: [id number] Date of Birth: 1941-06-24  Today's Date: 12/22/2020 OT Individual Time: 6440-3474 2595-6387 OT Individual Time Calculation (min): 56 min and 68 min   Short Term Goals: Week 1:  OT Short Term Goal 1 (Week 1): STGs = LTGs d/t ELOS at Mod I level  Skilled Therapeutic Interventions/Progress Updates:    1) Treatment session with focus on pain management and AROM/AAROM of RLE for mobility and pain management.  Pt received supine in bed reporting not sleeping well overnight due to pain in residual limb.  Pt unsure if pain at site vs phantom pain.  Pt completed bed mobility to sit up to EOB with supervision.  Noted draining along medial aspect of incision.  Removed dressing and left open to air to allow for MD to assess and make recommendations for dressing as ABD pad sticking to incision.  Therapist educating pt on care for residual limb with limb wrapping vs shrinker as well as use of inspection mirror to assess incision.  Pt returned to supine due to increased pain with limb in dependent position.  Engaged in knee extension in supine with distal aspect of limb on towel roll to facilitate increased knee extension.  Engaged in 2 sets of 10 hip extension and flexion in sidelying followed by 2 sets of 10 hip abduction in sidelying.  Pt reports pain along front of leg from hip to knee with ROM.  Therapist providing cues for pt to maintain straight knee during exercises.  Pt returned to supine and left with RLE on towel at distal aspect of limb for knee extension and drainage.    Pt reports pain 2/10 at rest, up to 4/10 with activity.    2) Treatment session with focus on pain management and education in regards to pain and residual limb care.  Pt received supine in bed with residual limb still unwrapped.  RN reports plan to have surgeon team come to assess due to redness and drainage, therefore session completed in room.   Pt completed bed mobility supervision supine <> sit periodically throughout session.  Therapist reiterated use of inspection mirror for limb inspection, pt not too keen on looking at limb especially in current state.  Pt asking questions about typical healing process.  Therapist directed pt to First Step publication, flagging areas about skin inspection, skin integrity, emotional support, and pain management.  Pt agreeable to attempting mirror therapy.  When therapist returned with mirror, pt seated upright on EOB visiting with family who had been present to visit his wife first.  Pt skeptical about mirror therapy but reports no phantom pain during LLE mobility with mirror, having had pain prior to engaging in mirror therapy.  Discussed alternative pain management and treatment strategies with focus on massage and tapping.  However pt with decreased tolerance to touch near incision site reporting pain to touch, not rated.  Discussed Amputee Support group and other resources both online and in the community.  Pt stating he lives in [facility], will need to find out if support more local to pt home.  RN aware of pain and redness around incision site, awaiting surgeon team to assess.  Therapy Documentation Precautions:  Precautions Precautions: Fall Precaution Comments: R BKA Required Braces or Orthoses: Other Brace Other Brace: residual limb guard if able to tolerate Restrictions Weight Bearing Restrictions: Yes RLE Weight Bearing: Touchdown weight bearing General:   Vital Signs: Therapy Vitals Temp: 98.6 F (37  C) Temp Source: Oral Pulse Rate: 60 Resp: 14 BP: (!) 146/85 Patient Position (if appropriate): Lying Oxygen Therapy SpO2: 100 % O2 Device: Room Air Pain: Pain Assessment Pain Scale: 0-10 Pain Score: 4   Therapy/Group: Individual Therapy  Simonne Come 12/22/2020, 8:22 AM

## 2020-12-23 LAB — GLUCOSE, CAPILLARY
Glucose-Capillary: 115 mg/dL — ABNORMAL HIGH (ref 70–99)
Glucose-Capillary: 148 mg/dL — ABNORMAL HIGH (ref 70–99)
Glucose-Capillary: 105 mg/dL — ABNORMAL HIGH (ref 70–99)

## 2020-12-23 MED ORDER — DOXYCYCLINE HYCLATE 100 MG PO TABS
100.0000 mg | ORAL_TABLET | Freq: Two times a day (BID) | ORAL | Status: DC
  Administered 2020-12-23 – 2020-12-27 (×9): 100 mg via ORAL
  Filled 2020-12-23 (×9): qty 1

## 2020-12-23 MED ORDER — INSULIN DETEMIR 100 UNIT/ML ~~LOC~~ SOLN
15.0000 [IU] | Freq: Every day | SUBCUTANEOUS | Status: DC
  Administered 2020-12-23 – 2020-12-24 (×2): 15 [IU] via SUBCUTANEOUS
  Filled 2020-12-23 (×3): qty 0.15

## 2020-12-23 MED ORDER — OXYCODONE HCL 5 MG PO TABS: 5.0000 mg | ORAL_TABLET | Freq: Four times a day (QID) | ORAL | Status: DC | PRN

## 2020-12-23 NOTE — Progress Notes (Signed)
Patient resting in bed alert and cooperative, verbalize mld discomfort to surgical right stump area after receiving a new stump shinker today,refusing adddtional medication at this time for pain discomfort.New stump shrinker In place,refused for this to be removed due to the discomfort of placing it on.. States he did not go to see his spouse today, but his grand daughter spent time with him today. He also verbalize his concerns about the wellness of his spouse not improving as fast as she need to. Reassurance and support provided, continue sleep chart documentation   0630 Sleep chart results 8/12 hrs, no c/o pain

## 2020-12-23 NOTE — Progress Notes (Signed)
PROGRESS NOTE   Subjective/Complaints: Appreciate Mary Ann's evaluation- started on doxycycline for increased induration, superficial necrosis and blistering. Patient had increased pain after evaluation. Currently comfortable. Smaller shrinker has been ordered.  K+ normalized  ROS: Patient denies fever, rash, sore throat, blurred vision, nausea, vomiting, diarrhea, cough, shortness of breath or chest pain,  headache, or mood change. +residual limb pain.   Objective:   No results found. Recent Labs    12/22/20 1419  WBC 8.5  HGB 12.1*  HCT 34.3*  PLT 256   Recent Labs    12/21/20 0512 12/22/20 0352  NA 133* 134*  K 3.4* 3.6  CL 92* 94*  CO2 32 31  GLUCOSE 116* 129*  BUN 24* 27*  CREATININE 1.03 1.14  CALCIUM 9.7 9.8    Intake/Output Summary (Last 24 hours) at 12/23/2020 0935 Last data filed at 12/23/2020 0720 Gross per 24 hour  Intake 694 ml  Output 1350 ml  Net -656 ml        Physical Exam: Vital Signs Blood pressure 126/69, pulse (!) 54, temperature 98.4 F (36.9 C), temperature source Oral, resp. rate 16, height 5\' 10"  (1.778 m), weight 66 kg, SpO2 (!) 87 %. Gen: no distress, normal appearing HEENT: oral mucosa pink and moist, NCAT Cardio: Reg rate Chest: normal effort, normal rate of breathing Abd: soft, non-distended Ext: no edema Psych: pleasant, normal affect Musculoskeletal:     Cervical back: Normal range of motion and neck supple.     Comments: R-BKA with pain, edema. UEs 5/5 in biceps, triceps, WE grip and finger abd B/L LLE- 5/5 in HF, KE, DF and PF RLE- HF/KE/ 5/5- however lacking 15-20 degrees  Of R knee extension; can get to 95 degrees knee flexion  Skin:    Comments: Right BK wound CI with mild oozing and increased erythema and tibial blisters, superficial necrosis in some areas Neurological:     Mental Status: He is alert and oriented to person, place, and time. Normal CN's     Comments: Light touch intact x 3+ extremities     Assessment/Plan: 1. Functional deficits which require 3+ hours per day of interdisciplinary therapy in a comprehensive inpatient rehab setting.  Physiatrist is providing close team supervision and 24 hour management of active medical problems listed below.  Physiatrist and rehab team continue to assess barriers to discharge/monitor patient progress toward functional and medical goals  Care Tool:  Bathing    Body parts bathed by patient: Right arm,Left arm,Chest,Abdomen,Front perineal area,Right upper leg,Left upper leg,Face,Buttocks   Body parts bathed by helper: Buttocks,Left lower leg Body parts n/a: Right lower leg   Bathing assist Assist Level: Supervision/Verbal cueing     Upper Body Dressing/Undressing Upper body dressing   What is the patient wearing?: Button up shirt    Upper body assist Assist Level: Set up assist    Lower Body Dressing/Undressing Lower body dressing      What is the patient wearing?: Underwear/pull up,Pants     Lower body assist Assist for lower body dressing: Supervision/Verbal cueing     Toileting Toileting    Toileting assist Assist for toileting: Contact Guard/Touching assist Assistive Device Comment: urinal  Transfers Chair/bed transfer  Transfers assist     Chair/bed transfer assist level: Supervision/Verbal cueing     Locomotion Ambulation   Ambulation assist      Assist level: Minimal Assistance - Patient > 75% Assistive device: Walker-rolling Max distance: 77ft   Walk 10 feet activity   Assist     Assist level: Minimal Assistance - Patient > 75% Assistive device: Walker-rolling   Walk 50 feet activity   Assist Walk 50 feet with 2 turns activity did not occur: Safety/medical concerns         Walk 150 feet activity   Assist Walk 150 feet activity did not occur: Safety/medical concerns         Walk 10 feet on uneven surface   activity   Assist Walk 10 feet on uneven surfaces activity did not occur: Safety/medical concerns         Wheelchair     Assist Will patient use wheelchair at discharge?: Yes Type of Wheelchair: Manual    Wheelchair assist level: Supervision/Verbal cueing Max wheelchair distance: 200    Wheelchair 50 feet with 2 turns activity    Assist        Assist Level: Supervision/Verbal cueing   Wheelchair 150 feet activity     Assist      Assist Level: Supervision/Verbal cueing   Blood pressure 126/69, pulse (!) 54, temperature 98.4 F (36.9 C), temperature source Oral, resp. rate 16, height 5\' 10"  (1.778 m), weight 66 kg, SpO2 (!) 87 %.  Medical Problem List and Plan: 1.  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  Impaired mobility: -DVT/anticoagulation:  Pharmaceutical:Continue Lovenox- ambulated 50 feet              -antiplatelet therapy: ASA/Plavix 3. Pain Management:   gabapentin to tid to help with phantom pain, continue             - off oxy CR  - only requiring IR oxycodone once per day  -decrease oxycodone to 5mg - using once per day- decrease to q6H. Can use tramadol if needed in between.  4. Mood: LCSW to follow for evaluation and support.              -antipsychotic agents: N/A 5. Neuropsych: This patient is capable of making decisions on his own behalf. 6. Skin/Wound Care: wound vac off  -continue local wound care 7. Fluids/Electrolytes/Nutrition: Monitor I/O. Check lytes in am.  8. HTN: Monitor BP tid--on Norvasc, HCTZ and Cozaar.   9. AKI: Monitor renal status with serial checks.  Cr normalized, repeat outpatient.  10. T2DM: Hgb A1C-7.7. Monitor BS ac/hs and use SSI for elevated BS.  -CBG's labile              --Jardiance, metformin (SCr borderline  at 1.33), Levemir 10u qhs with 4 units tid ac for meal coverage.   CBGs 151-155, increase levemir to 15U 11. Hyponatremia with hypovolemia:  sodium back up to 133  -continue to encourage appropriate fluids  -recheck bmet on Monday 12. CAD s/p PTCA: Monitor for symptoms with increase in activity.             --On DAPT,  Imdur, Cozaar and Jardiance.  13. Constipation: d/c Miralax as refusing.  14. Hypokalemia: normalized, repeat outpatient.  15. Anemia: Hgb 11.4 today, repeat Monday.  16. Disposition: Lives with wife who is currently in ICU. Was independent prior to admission. Advised to scheduled caregiver training with his granddaughter or daughter(who is off work Wednesday and Thursday). HFU w/ me 4/14 9:20am. D/c Saturday.    LOS: 7 days A FACE TO FACE EVALUATION WAS PERFORMED  Clide Deutscher Raulkar 12/23/2020, 9:35 AM

## 2020-12-23 NOTE — Progress Notes (Addendum)
Patient ID: Keith Espinoza, male   DOB: [birth year], 80 y.o.   MRN: [id number]  Met with pt to discuss team conference goals mod/i-supervision level and target discharge date of 3/26. Have left message for daughter regarding conference and family education. Pt in agreement with ordering wheelchair and drop-arm bedside commode. Prefers to use [facility] for follow up therapies. Will await daughter's call to schedule education.  2;22 PM Spoke with daughter who plans to be here tomorrow-aware of therapy schedule at 9:00-11:15 will be here for both sessions. Will let team know this. Will also get with RN to learn how to wrap his leg.

## 2020-12-23 NOTE — Progress Notes (Signed)
Occupational Therapy Session Note  Patient Details  Name: Keith Espinoza MRN: [id number] Date of Birth: 1940/12/15  Today's Date: 12/23/2020 OT Individual Time: 3734-2876 OT Individual Time Calculation (min): 58 min    Short Term Goals: Week 1:  OT Short Term Goal 1 (Week 1): STGs = LTGs d/t ELOS at Mod I level  Skilled Therapeutic Interventions/Progress Updates:    Treatment session with focus on self-care retraining, sit > stand, and dynamic standing balance.  Pt received upright on EOB with RN present administering morning meds.  Pt agreeable to bathing/dressing from EOB.  Therapist set up with items and pt completed bathing with lateral leans.   Dr. Jess Barters PA arrived to assess residual limb with pt reporting increased pain after "poking and prodding".  Pt distracted by pain after assessment, but with increased time and continued work on bathing and dressing pt reports pain coming back down.  Completed sit > stand to doff shorts prior to bathing with CGA and pt requiring increased time for motor planning and min cues for sequencing.  Pt chose to don underwear and shorts with lateral leans, ultimately returning to supine and bridging to pull fully over hips, as he reports he will most likely have to complete it in this manner at home. Therapist encouraged sit > stand during therapy sessions to improve dynamic standing balance.  Pt completed squat pivot transfer bed > w/c with close supervision.  Pt propelled to sink and completed oral care with setup for improved positioning.  Pt remained upright in w/c with seat belt alarm on and all needs in reach.  Therapy Documentation Precautions:  Precautions Precautions: Fall Precaution Comments: R BKA Required Braces or Orthoses: Other Brace Other Brace: residual limb guard if able to tolerate Restrictions Weight Bearing Restrictions: Yes RLE Weight Bearing: Non weight bearing General:   Vital Signs: Therapy Vitals Temp: 98.4 F (36.9 C) Temp  Source: Oral Pulse Rate: (!) 54 Resp: 16 BP: 126/69 Patient Position (if appropriate): Lying Oxygen Therapy SpO2: (!) 87 % O2 Device: Room Air Pain: Pain Assessment Pain Scale: 0-10 Pain Score: 0-No pain ADL: ADL Eating: Independent Grooming: Setup Upper Body Bathing: Setup Where Assessed-Upper Body Bathing: Edge of bed Lower Body Bathing: Minimal assistance Where Assessed-Lower Body Bathing: Edge of bed Upper Body Dressing: Minimal assistance Where Assessed-Upper Body Dressing: Edge of bed Lower Body Dressing: Minimal assistance Where Assessed-Lower Body Dressing: Edge of bed Toileting: Not assessed Toilet Transfer: Not assessed Vision   Perception    Praxis   Exercises:   Other Treatments:     Therapy/Group: Individual Therapy  Simonne Come 12/23/2020, 8:10 AM

## 2020-12-23 NOTE — Progress Notes (Signed)
Asked by rehab today to reevaluate patient.  He is status post below-knee amputation.  He has had some drainage when the dressing change has been done.  Also appears that his 3 XL shrinker may be too large  Patient is sitting up working with occupational therapy.  Shrinker was removed and is somewhat big.  He has well apposed wound edges has some areas of superficial necrosis and blistering.  He does have some induration.  No fluctuance cannot express anything but serous fluid.  Swelling is overall well constrained does not have any ascending cellulitis but does have an area of skin breakdown on the anterior tibia.  Some of the redness may be secondary to anticoagulation.  That being said we will order smaller shrinker.  Also will place him on doxycycline 100 mg twice daily to have shrinker next to skin daily cleansing with antibacterial soap and water.

## 2020-12-23 NOTE — Progress Notes (Signed)
Occupational Therapy Session Note  Patient Details  Name: Keith Espinoza MRN: [id number] Date of Birth: [birth year]  Today's Date: 12/23/2020 OT Individual Time: 0912-1007 OT Individual Time Calculation (min): 55 min    Short Term Goals: Week 1:  OT Short Term Goal 1 (Week 1): STGs = LTGs d/t ELOS at Mod I level  Skilled Therapeutic Interventions/Progress Updates:    Pt in wheelchair to start session.  He reported increased pain in the right residual limb so, meds were brought by nursing prior to leaving the unit.  He was able to roll his wheelchair down to the central elevators with supervision for transport to the dayroom.  Therapist helped him the rest of the way.  Had him focus on sit to stand transitions and standing balance while engaged in the Wii activity.  He needed overall min assist for sit to stand and standing balance while using the RUE to activate the controller.  Occasional LOB to the right and posteriorly secondary to not having the UE support on the RUE.  He was able to complete three intervals of standing for up to 2 mins each before needing to rest.  Increased pain was noted with the residual limb down in standing and when not supported on the leg rest.  Finished session with return to the room and pt completing squat pivot transfer to the bed.  Call button and phone in reach with safety alarm in place.    Therapy Documentation Precautions:  Precautions Precautions: Fall Precaution Comments: R BKA Required Braces or Orthoses: Other Brace Other Brace: residual limb guard if able to tolerate Restrictions Weight Bearing Restrictions: Yes RLE Weight Bearing: Non weight bearing  Pain: Pain Assessment Pain Scale: 0-10 Pain Score: 1  Pain Type: Surgical pain Pain Location: Leg Pain Orientation: Right Pain Descriptors / Indicators: Aching;Throbbing Pain Frequency: Constant Pain Onset: On-going Patients Stated Pain Goal: 2 Pain Intervention(s): Medication (See  eMAR);Repositioned ADL: See Care Tool Section for some details of mobility and selfcare  Therapy/Group: Individual Therapy  Sarahmarie Leavey,[place] OTR/L 12/23/2020, 12:11 PM

## 2020-12-23 NOTE — Progress Notes (Signed)
Orthopedic Tech Progress Note Patient Details:  Keith Espinoza 1941/02/15 [id number] Called in order to [facility] for [facility]  Patient ID: [name], male   DOB: 1941/02/09, 80 y.o.   MRN: [id number]   [name] 12/23/2020, 8:43 AM

## 2020-12-23 NOTE — Progress Notes (Signed)
Patient resting at interval throughout shift, continue sleep chart, slept approximately 9.5 /12 hrs. Continue regime,

## 2020-12-23 NOTE — Plan of Care (Signed)
  Problem: Sit to Stand Goal: LTG:  Patient will perform sit to stand with assistance level (PT) Description: LTG:  Patient will perform sit to stand with assistance level (PT) Flowsheets (Taken 12/23/2020 1221) LTG: PT will perform sit to stand in preparation for functional mobility with assistance level: (downgraded due to pain, decreased balance/postural control, and fatigue) Contact Guard/Touching assist Note: downgraded due to pain, decreased balance/postural control, and fatigue   Problem: RH Car Transfers Goal: LTG Patient will perform car transfers with assist (PT) Description: LTG: Patient will perform car transfers with assistance (PT). Flowsheets (Taken 12/23/2020 1221) LTG: Pt will perform car transfers with assist:: (downgraded due to pain, decreased balance/postural control, and fatigue) Supervision/Verbal cueing Note: downgraded due to pain, decreased balance/postural control, and fatigue   Problem: RH Ambulation Goal: LTG Patient will ambulate in controlled environment (PT) Description: LTG: Patient will ambulate in a controlled environment, # of feet with assistance (PT). Flowsheets (Taken 12/23/2020 1221) LTG: Pt will ambulate in controlled environ  assist needed:: (downgraded due to pain, decreased balance/postural control, and fatigue) Contact Guard/Touching assist LTG: Ambulation distance in controlled environment: 39ft with LRAD Note: downgraded due to pain, decreased balance/postural control, and fatigue Goal: LTG Patient will ambulate in home environment (PT) Description: LTG: Patient will ambulate in home environment, # of feet with assistance (PT). Flowsheets (Taken 12/23/2020 1221) LTG: Pt will ambulate in home environ  assist needed:: (downgraded due to pain, decreased balance/postural control, and fatigue) Contact Guard/Touching assist LTG: Ambulation distance in home environment: 1ft with LRAD Note: downgraded due to pain, decreased balance/postural control, and  fatigue

## 2020-12-23 NOTE — Patient Care Conference (Signed)
Inpatient RehabilitationTeam Conference and Plan of Care Update Date: 12/23/2020   Time: 9:34 AM    Patient Name: Keith Espinoza      Medical Record Number: [id number]  Date of Birth: 1941-09-20 Sex: Male         Room/Bed: 0F75Z/0C58N-27 Payor Info: Payor: MEDICARE / Plan: MEDICARE PART A AND B / Product Type: *No Product type* /    Admit Date/Time:  12/16/2020  2:59 PM  Primary Diagnosis:  Below-knee amputation of right lower extremity [facility])  Hospital Problems: Principal Problem:   Below-knee amputation of right lower extremity [facility])    Expected Discharge Date: Expected Discharge Date: 12/27/20  Team Members Present: Physician leading conference: Dr. Leeroy Cha Care Coodinator Present: Erlene Quan, BSW;Stacey Creig Hines, RN, BSN, [place] Nurse Present: Dorthula Nettles, RN PT Present: Becky Sax, PT OT Present: Simonne Come, OT PPS Coordinator present : Gunnar Fusi, SLP     Current Status/Progress Goal Weekly Team Focus  Bowel/Bladder   this patient is Continent of bladder/bowel  Remains continent of B/B  Assess tolieting needs QS/PRN   Swallow/Nutrition/ Hydration             ADL's   CGA squat pivot and stand pivot transfers, CGA standing and short distance ambulation, Min A bathing and dressing  Mod I overall  pain management, residual limb care, ADL retraining, dynamic standing   Mobility   bed mobility mod I, squat<>pivot transfers CGA, gait 73ft with RW min A, WC mobility>182ft supervision  Mod I  functional mobility/transfers, generalized strengthening, dynamic standing balance/coordination, ambulation, amputee education, and improved endurance.   Communication             Safety/Cognition/ Behavioral Observations            Pain   Currently patient pain score is 4-5/10 s/p surgical Right BKA, continue to manage with prn Tramadol 50 mg and other meds  Pt pain level at a comfortable level or < 2 out of 10  QS/PRN assessment with reassessment   Skin    s/p Right BKA ,Incision intact Skin intact,some redness with small amount of drainage to no drainage noted,   stump shinker and   in place , no srainage  R-BKA remains free of S/S of infection.  Continue QS/PRN assessment     Discharge Planning:  Pt doing better and plan for home with his daughter and granddaughter's assisting him. His wife is still in the hospital.   Team Discussion: MD monitoring Na+ and K+, increasing induration and erythema of residual limb, Dr. Jess Barters PA evaled today. Started on Doxycycline. Continent B/B, pain with activity, educating patient not to dangle residual limb over EOB. Residual limb is red, warm to touch with scant drainage. To discharge home with daughter and granddaughters. Patient on target to meet rehab goals: Yes, mod I for bed mobility and squat pivot transfers. Pain is limiting. Contact guard transfers and lateral leans for dressing. Mod I goals for discharge.  *See Care Plan and progress notes for long and short-term goals.   Revisions to Treatment Plan:  Started on Doxycycline.  Teaching Needs: Family education, medication management, pain management, skin/wound care, transfer training, gait training, balance training, endurance training, safety awareness, weight bearing precautions.  Current Barriers to Discharge: Inaccessible home environment, Decreased caregiver support, Home enviroment access/layout, Wound care, Lack of/limited family support, Weight bearing restrictions, Medication compliance and Behavior  Possible Resolutions to Barriers: Continue current medications, provide emotional support.     Medical Summary Current Status:  hyponatremia, residual limb pain, increasing induration and erythema of residual limb, hypokalemia  Barriers to Discharge: Medical stability;Wound care  Barriers to Discharge Comments: hyponatremia, hypokalemia, residual limb pain, increasing induration and erythema of residual limb Possible Resolutions to  [employer] Focus: continue to monitor Na and K+ outpatient, evaluation by Dr. Jess Barters PA today, started on doxycycline   Continued Need for Acute Rehabilitation Level of Care: The patient requires daily medical management by a physician with specialized training in physical medicine and rehabilitation for the following reasons: Direction of a multidisciplinary physical rehabilitation program to maximize functional independence : Yes Medical management of patient stability for increased activity during participation in an intensive rehabilitation regime.: Yes Analysis of laboratory values and/or radiology reports with any subsequent need for medication adjustment and/or medical intervention. : Yes   I attest that I was present, lead the team conference, and concur with the assessment and plan of the team.   Cristi Loron 12/23/2020, 12:46 PM

## 2020-12-23 NOTE — Progress Notes (Signed)
Physical Therapy Session Note  Patient Details  Name: Keith Espinoza MRN: [id number] Date of Birth: 1940/11/26  Today's Date: 12/23/2020 PT Individual Time: 1030-1057 and 1345-144 PT Individual Time Calculation (min): 27 min and 55 min  Short Term Goals: Week 1:  PT Short Term Goal 1 (Week 1): =LTG due to ELOS  Skilled Therapeutic Interventions/Progress Updates:   Treatment Session 1: 1030-1057 27 min Received pt sidelying in bed asleep, pt easily woken, agreeable to therapy, and reported pain 2/10 in R residual limb. R residual limb guard donned throughout session. Pt reported his ramp was installed yesterday and is ready to go. Pt performed the following exercises with supervision and verbal cues for technique: -SLR 2x8 bilaterally  -hip abduction 2x10 bilaterally  -single leg bridge x6 Scott from [place] present and provided pt with 2 smaller shrinkers. 30 old shrinker and applied new one with total A. Supine<>sitting EOB mod I and brief discussion had regarding process for receiving prosthetic. Concluded session with pt sitting EOB, needs within reach, and bed alarm on.   Treatment Session 2: 1345-1440 55 min Received pt sitting EOB, pt agreeable to therapy, and reported pain 4/10 in R residual limb (premedicated). Session with emphasis on functional mobility/transfers, generalized strengthening, dynamic standing balance/coordination, ambulation, and improved activity tolerance. Bed<>WC squat<>pivot with close supervision. Donned WC gloves with set up assist, L shoe with max A, and limb guard. Pt performed WC 140ft x 1 and 44ft x 1 using BUEs and supervision. Pt transferred sit<>stand with RW and CGA/min A and ambulated 3ft with RW and CGA. Worked on dynamic standing balance playing cornhole using LUE x 2 trials and CGA and seated using RUE x2 trials. Pt performed the following exercises sitting on mat with supervision and verbal cues for technique:  -overhead shoulder press with 5lb  dowel 2x10 -horizontal chest press with 5lb dowel 2x10 -LAQ 2x12 with 2lb ankle weight on LLE -hip flexion 2x12 with 2lb ankle weight on LLE -trunk rotations with 4lb dumbbell 2x10 bilaterally  Mat<>WC squat<>pivot with CGA and pt transported back to room in [facility] total A. Pt requested to return to bed and transferred WC<>bed squat<>pivot with CGA/min A to L. Concluded session with pt sitting EOB, needs within reach, and bed alarm on. Provided pt with fresh ice water.   Therapy Documentation Precautions:  Precautions Precautions: Fall Precaution Comments: R BKA Required Braces or Orthoses: Other Brace Other Brace: residual limb guard if able to tolerate Restrictions Weight Bearing Restrictions: Yes RLE Weight Bearing: Non weight bearing  Therapy/Group: Individual Therapy Alfonse Alpers PT, DPT   12/23/2020, 7:29 AM

## 2020-12-24 LAB — GLUCOSE, CAPILLARY
Glucose-Capillary: 115 mg/dL — ABNORMAL HIGH (ref 70–99)
Glucose-Capillary: 116 mg/dL — ABNORMAL HIGH (ref 70–99)
Glucose-Capillary: 237 mg/dL — ABNORMAL HIGH (ref 70–99)
Glucose-Capillary: 112 mg/dL — ABNORMAL HIGH (ref 70–99)

## 2020-12-24 MED ORDER — OXYCODONE HCL 5 MG PO TABS
5.0000 mg | ORAL_TABLET | Freq: Three times a day (TID) | ORAL | Status: DC | PRN
  Administered 2020-12-24: 5 mg via ORAL
  Filled 2020-12-24 (×2): qty 1

## 2020-12-24 MED ORDER — GLUCERNA SHAKE PO LIQD
237.0000 mL | Freq: Three times a day (TID) | ORAL | Status: DC
  Administered 2020-12-24 – 2020-12-27 (×9): 237 mL via ORAL
  Filled 2020-12-24: qty 237

## 2020-12-24 NOTE — Progress Notes (Signed)
Occupational Therapy Session Note  Patient Details  Name: Keith Espinoza MRN: [id number] Date of Birth: 02/25/1941  Today's Date: 12/24/2020 OT Individual Time: 0947-0962 and 1305-1400 OT Individual Time Calculation (min): 54 min and 55 min   Short Term Goals: Week 1:  OT Short Term Goal 1 (Week 1): STGs = LTGs d/t ELOS at Mod I level  Skilled Therapeutic Interventions/Progress Updates:    1) Treatment session with focus on family education with pt and pt's daughter.  Pt received upright in w/c agreeable to therapy session.  Pt reports family member installed ramp into bathroom as pt previously had 1 step into bathroom.  Pt transported to ADL apt to focus on functional transfers in home environment. Pt ambulated 10' with RW with close supervision to [facility] over toilet.  Discussed recommendation for supervision when ambulating.  Educated on RUE support in standing to counteract R BKA for improved standing balance.  Therapist set up drop arm BSC next to bed and simulated squat pivot transfers bed <> drop arm BSC as per recommendation for home use.  Engaged in mobility in ADL kitchen from w/c level with focus on improved positioning and safety to obtain items.  Daughter reports that she and granddaughter will be providing meals and he may have to microwave them.  Engaged in care regarding residual limb with donning/doffing shrinker, and shrinker wear.  Pt tolerated removal of shrinker.  Therapist and daughter reapplied shrinker with gentle care to improve fit.  Discussed shrinker tube as aid to donning shrinker and recommendation for 4 hands when donning due to tight fit.  Pt's daughter asking about wear schedule and washing shrinker as well as limb inspection.  Therapist answered to pt and daughter satisfaction, also directing attention to instructions in shrinker packet.  2) Treatment session with focus on residual limb care, d/c planning, and pain management.  Pt received in sidelying, reporting pain in  residual limb.  RN reports residual limb having been bleeding, questioning if a staple had been snagged during donning of shrinker.  RN reports having just removed shrinker and applying a pressure dressing to bleeding site.  Pt remained in sidelying and supine throughout session.  Engaged in discussion regarding care for residual limb, massage, and tapping for pain management and desensitization. Therapist educated on use of shrinker tube and technique for donning shrinker, however did not complete due to pain and leg currently undressed with pressure dressing.  Pt tearful during session, in regards to his wife's current status.  Therapy Documentation Precautions:  Precautions Precautions: Fall Precaution Comments: R BKA Required Braces or Orthoses: Other Brace Other Brace: residual limb guard if able to tolerate Restrictions Weight Bearing Restrictions: Yes RLE Weight Bearing: Non weight bearing General:   Vital Signs: Therapy Vitals Temp: 98.3 F (36.8 C) Temp Source: Oral Pulse Rate: 79 Resp: 17 BP: 112/82 Patient Position (if appropriate): Sitting Oxygen Therapy SpO2: 100 % O2 Device: Room Air Pain: 1) Pt with no c/o pain  2) Pt with c/o pain in residual limb, premedicated. Not rated  Therapy/Group: Individual Therapy  Simonne Come 12/24/2020, 2:54 PM

## 2020-12-24 NOTE — Progress Notes (Signed)
PROGRESS NOTE   Subjective/Complaints: No complaints this morning Does report pain at times but has been using pain medications sparingly given cognitive dysfunction with use. Discussed K levels and prosthesis options for him in the future.   ROS: Patient denies fever, rash, sore throat, blurred vision, nausea, vomiting, diarrhea, cough, shortness of breath or chest pain,  headache, or mood change. +residual limb pain. Denies phantom limb pain  Objective:   No results found. Recent Labs    12/22/20 1419  WBC 8.5  HGB 12.1*  HCT 34.3*  PLT 256   Recent Labs    12/22/20 0352  NA 134*  K 3.6  CL 94*  CO2 31  GLUCOSE 129*  BUN 27*  CREATININE 1.14  CALCIUM 9.8    Intake/Output Summary (Last 24 hours) at 12/24/2020 1216 Last data filed at 12/24/2020 0710 Gross per 24 hour  Intake 891 ml  Output 1300 ml  Net -409 ml        Physical Exam: Vital Signs Blood pressure 118/74, pulse 62, temperature 98.3 F (36.8 C), temperature source Oral, resp. rate 18, height 5\' 10"  (1.778 m), weight 66.8 kg, SpO2 97 %. Gen: no distress, normal appearing HEENT: oral mucosa pink and moist, NCAT Cardio: Reg rate Chest: normal effort, normal rate of breathing Abd: soft, non-distended Ext: no edema Psych: pleasant, normal affect Skin: intact Musculoskeletal:     Cervical back: Normal range of motion and neck supple.     Comments: R-BKA with pain, edema. UEs 5/5 in biceps, triceps, WE grip and finger abd B/L LLE- 5/5 in HF, KE, DF and PF RLE- HF/KE/ 5/5- however lacking 15-20 degrees  Of R knee extension; can get to 95 degrees knee flexion  Skin:    Comments: Right BK wound CI with mild oozing and increased erythema and tibial blisters, superficial necrosis in some areas Neurological:     Mental Status: He is alert and oriented to person, place, and time. Normal CN's    Comments: Light touch intact x 3+ extremities      Assessment/Plan: 1. Functional deficits which require 3+ hours per day of interdisciplinary therapy in a comprehensive inpatient rehab setting.  Physiatrist is providing close team supervision and 24 hour management of active medical problems listed below.  Physiatrist and rehab team continue to assess barriers to discharge/monitor patient progress toward functional and medical goals  Care Tool:  Bathing    Body parts bathed by patient: Right arm,Left arm,Chest,Abdomen,Front perineal area,Right upper leg,Left upper leg,Face,Buttocks   Body parts bathed by helper: Buttocks,Left lower leg Body parts n/a: Right lower leg   Bathing assist Assist Level: Supervision/Verbal cueing     Upper Body Dressing/Undressing Upper body dressing   What is the patient wearing?: Button up shirt    Upper body assist Assist Level: Set up assist    Lower Body Dressing/Undressing Lower body dressing      What is the patient wearing?: Underwear/pull up     Lower body assist Assist for lower body dressing: Supervision/Verbal cueing     Toileting Toileting    Toileting assist Assist for toileting: Contact Guard/Touching assist Assistive Device Comment: urinal   Transfers Chair/bed transfer  Transfers assist     Chair/bed transfer assist level: Contact Guard/Touching assist     Locomotion Ambulation   Ambulation assist      Assist level: Contact Guard/Touching assist Assistive device: Walker-rolling Max distance: 57ft   Walk 10 feet activity   Assist     Assist level: Contact Guard/Touching assist Assistive device: Walker-rolling   Walk 50 feet activity   Assist Walk 50 feet with 2 turns activity did not occur: Safety/medical concerns         Walk 150 feet activity   Assist Walk 150 feet activity did not occur: Safety/medical concerns         Walk 10 feet on uneven surface  activity   Assist Walk 10 feet on uneven surfaces activity did not occur:  Safety/medical concerns         Wheelchair     Assist Will patient use wheelchair at discharge?: Yes Type of Wheelchair: Manual    Wheelchair assist level: Supervision/Verbal cueing Max wheelchair distance: 110ft    Wheelchair 50 feet with 2 turns activity    Assist        Assist Level: Supervision/Verbal cueing   Wheelchair 150 feet activity     Assist      Assist Level: Supervision/Verbal cueing   Blood pressure 118/74, pulse 62, temperature 98.3 F (36.8 C), temperature source Oral, resp. rate 18, height 5\' 10"  (1.778 m), weight 66.8 kg, SpO2 97 %.  Medical Problem List and Plan: 1.  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  Impaired mobility: -DVT/anticoagulation:  Pharmaceutical: Continue Lovenox- ambulated 50 feet. Discussed various prosthetic options in the future.              -antiplatelet therapy: ASA/Plavix 3. Pain Management:   gabapentin to tid to help with phantom pain, continue             - off oxy CR  - only requiring IR oxycodone once per day  -decrease oxycodone to 5mg - using once per day- decrease to q8H PRN. Can use tramadol if needed in between.  4. Mood: LCSW to follow for evaluation and support.              -antipsychotic agents: N/A 5. Neuropsych: This patient is capable of making decisions on his own behalf. 6. Skin/Wound Care: wound vac off  -continue local wound care 7. Fluids/Electrolytes/Nutrition: Monitor I/O. Check lytes in am. D/c prosource 8. HTN: Monitor BP tid--on Norvasc, HCTZ and Cozaar.   9. AKI: Monitor renal status with serial checks.  Cr normalized, repeat outpatient.  10. T2DM: Hgb A1C-7.7. Monitor BS ac/hs and use SSI for elevated BS.  -CBG's labile             -Continue Jardiance, metformin (SCr borderline at  1.33), Levemir 10u qhs with 4 units tid ac for meal coverage.   CBGs 151-155, increase levemir to 15U 11. Hyponatremia with hypovolemia:  sodium back up to 133  -continue to encourage appropriate fluids  -recheck bmet on Monday 12. CAD s/p PTCA: Monitor for symptoms with increase in activity.             --On DAPT, Imdur,  Cozaar and Jardiance.  13. Constipation: d/c Miralax as refusing.  14. Hypokalemia: normalized, repeat outpatient.  15. Anemia: Hgb 11.4 today, repeat Monday.  16. Disposition: Lives with wife who is currently in ICU. Was independent prior to admission. Caregiver training with daughter today. HFU w/ me 4/14 9:20am. D/c Saturday.    LOS: 8 days A FACE TO FACE EVALUATION WAS PERFORMED  Clide Deutscher Raulkar 12/24/2020, 12:16 PM

## 2020-12-24 NOTE — Plan of Care (Signed)
  Problem: RH Tub/Shower Transfers Goal: LTG Patient will perform tub/shower transfers w/assist (OT) Description: LTG: Patient will perform tub/shower transfers with assist, with/without cues using equipment (OT) Outcome: Not Applicable Flowsheets (Taken 12/24/2020 1344) LTG: Pt will perform tub/shower stall transfers with assistance level of: (D/C) -- Note: D/C as bathroom shower is not accessible.  Plan to have shower renovated to increase safety and access   Problem: RH Balance Goal: LTG Patient will maintain dynamic standing with ADLs (OT) Description: LTG:  Patient will maintain dynamic standing balance with assist during activities of daily living (OT)  Flowsheets (Taken 12/24/2020 1344) LTG: Pt will maintain dynamic standing balance during ADLs with: (downgraded) Supervision/Verbal cueing Note: Downgraded due to decreased balance/postural control   Problem: Sit to Stand Goal: LTG:  Patient will perform sit to stand in prep for activites of daily living with assistance level (OT) Description: LTG:  Patient will perform sit to stand in prep for activites of daily living with assistance level (OT) Flowsheets (Taken 12/24/2020 1344) LTG: PT will perform sit to stand in prep for activites of daily living with assistance level: Supervision/Verbal cueing Note: Downgraded due to decreased balance/postural control   Problem: RH Bathing Goal: LTG Patient will bathe all body parts with assist levels (OT) Description: LTG: Patient will bathe all body parts with assist levels (OT) Flowsheets (Taken 12/24/2020 1344) LTG: Pt will perform bathing with assistance level/cueing: Set up assist  Note: Downgraded due to decreased balance/postural control

## 2020-12-24 NOTE — Progress Notes (Signed)
Physical Therapy Session Note  Patient Details  Name: Keith Espinoza MRN: [id number] Date of Birth: 02-09-1941  Today's Date: 12/24/2020 PT Individual Time: 0900-1010 PT Individual Time Calculation (min): 70 min   Short Term Goals: Week 1:  PT Short Term Goal 1 (Week 1): =LTG due to ELOS  Skilled Therapeutic Interventions/Progress Updates:   Received pt sitting EOB with daughter present for family education training, pt agreeable to therapy, and reported pain 2/10 in R residual limb and declined pain interventions. Session with emphasis on discharge planning, functional mobility/transfers, generalized strengthening, dynamic standing balance/coordination, ambulation, simulated car transfers, and improved activity tolerance. Donned limb guard with set up assist and L shoe with supervision. Bed<>WC squat<>pivot with close supervision and cues for transfer set up and reminder to lock brakes. Pt performed WC mobility 148ft x 1, 102ft x1, and 166ft x 1 using BUE and supervision. Pt performed simulated car transfer x 2 trials. Trial 1: stand<>pivot with RW and CGA provided by therapist to enter car and CGA provided by pt's daughter to exit car. Pt required cues for transfer technique and RW safety and pt's daughter educated on importance of standing on R side in case of LOB. Pt then performed additional car transfer via squat<>pivot technique with close supervision and cues for LLE foot placement on floor prior to exiting car. Pt and daughter verbalized and demonstrated confidence with WC parts management including donning/doffing legrests, armrests, and locking/unlocking brakes. In rehab apartment, pt performed squat<>pivot transfer WC<>recliner with CGA with pt's daughter stabilizing recliner with increased difficulty due to placement of armrests and soft surface of recliner. Pt and daughter reported they will be ordering a new recliner that does not rock for pt to use at home. Encouraged pt to have someone  with him when he initially gets on/off recliner. Sit<>stand with RW and CGA and pt ambulated 41ft with RW and CGA with cues to increase LLE step length for safety. Educated pt/daughter on avoiding ambulating at home and minimizing standing due to safety concerns. Reiterated that if pt is standing, he requires CGA at all times; pt and daughter in agreement. MD present for morning rounds to discuss pain medications. Pt transported back to room in [facility] total A. Concluded session with pt sitting in WC awaiting OT family education session with daughter present at bedside.   Therapy Documentation Precautions:  Precautions Precautions: Fall Precaution Comments: R BKA Required Braces or Orthoses: Other Brace Other Brace: residual limb guard if able to tolerate Restrictions Weight Bearing Restrictions: Yes RLE Weight Bearing: Non weight bearing (s/p BKA)  Therapy/Group: Individual Therapy Alfonse Alpers PT, DPT   12/24/2020, 7:20 AM

## 2020-12-24 NOTE — Progress Notes (Signed)
Patient ID: Keith Espinoza, male   DOB: 1941-03-19, 80 y.o.   MRN: [id number]  Entered patient's room, introduced myself, and explained my role in his care while on rehab. Explained that I brought handouts of his diagnosis and medications he is taking. He stated he is having no pain and hasn't for the last two days. He was very tearful when speaking of his wife, who is also in the hospital in the ICU. He is unsure of her status at this time. His discharge is set for Saturday 12/27/20, but would like to discharge sooner if possible. He is still waiting on his wheelchair and BSC. I don't believe he will have a ride until Saturday because he mentioned his daughter and granddaughter both work. Will continue with emotional support.  Dorthula Nettles, RN3, BSN, CBIS, [place], [facility], Inpatient Rehabilitation Office [phone] Cell [phone]

## 2020-12-24 NOTE — Progress Notes (Signed)
Patient ID: Keith Espinoza, male   DOB: 12-14-1940, 80 y.o.   MRN: [id number]  Met with pt and daughter who is here for education today in prepartion of discharge Sat. Have set up home health via [facility] pt  preference and equipment ordered to be delivered to room tomorrow.

## 2020-12-25 LAB — GLUCOSE, CAPILLARY
Glucose-Capillary: 160 mg/dL — ABNORMAL HIGH (ref 70–99)
Glucose-Capillary: 76 mg/dL (ref 70–99)
Glucose-Capillary: 263 mg/dL — ABNORMAL HIGH (ref 70–99)
Glucose-Capillary: 216 mg/dL — ABNORMAL HIGH (ref 70–99)

## 2020-12-25 MED ORDER — INSULIN DETEMIR 100 UNIT/ML ~~LOC~~ SOLN
16.0000 [IU] | Freq: Every day | SUBCUTANEOUS | Status: DC
  Administered 2020-12-25: 16 [IU] via SUBCUTANEOUS
  Filled 2020-12-25 (×2): qty 0.16

## 2020-12-25 MED ORDER — GABAPENTIN 400 MG PO CAPS
400.0000 mg | ORAL_CAPSULE | Freq: Three times a day (TID) | ORAL | Status: DC
  Administered 2020-12-25 – 2020-12-27 (×6): 400 mg via ORAL
  Filled 2020-12-25 (×6): qty 1

## 2020-12-25 MED ORDER — OXYCODONE HCL 5 MG PO TABS
5.0000 mg | ORAL_TABLET | Freq: Two times a day (BID) | ORAL | Status: DC | PRN
  Administered 2020-12-25 – 2020-12-26 (×2): 5 mg via ORAL
  Filled 2020-12-25 (×4): qty 1

## 2020-12-25 NOTE — Progress Notes (Signed)
PROGRESS NOTE   Subjective/Complaints: No complaints this morning. Discussed decreasing oxycodone to 5mg  q12H and he is agreeable. Does have left foot neuropathic pain and slept poorly last night. Discussed increasing Gabapentin to 400mg  and he is agreeable  ROS: Patient denies fever, rash, sore throat, blurred vision, nausea, vomiting, diarrhea, cough, shortness of breath or chest pain,  headache, or mood change. +residual limb pain. +neuropathic pain in left foot  Objective:   No results found. Recent Labs    12/22/20 1419  WBC 8.5  HGB 12.1*  HCT 34.3*  PLT 256   No results for input(s): NA, K, CL, CO2, GLUCOSE, BUN, CREATININE, CALCIUM in the last 72 hours.  Intake/Output Summary (Last 24 hours) at 12/25/2020 1359 Last data filed at 12/25/2020 1300 Gross per 24 hour  Intake 616 ml  Output 975 ml  Net -359 ml        Physical Exam: Vital Signs Blood pressure (!) 109/40, pulse 66, temperature 97.6 F (36.4 C), resp. rate 17, height 5\' 10"  (1.778 m), weight 66.8 kg, SpO2 98 %. Gen: no distress, normal appearing HEENT: oral mucosa pink and moist, NCAT Cardio: Reg rate Chest: normal effort, normal rate of breathing Abd: soft, non-distended Ext: no edema Psych: pleasant, normal affect Skin: intact Musculoskeletal:     Cervical back: Normal range of motion and neck supple.     Comments: R-BKA with pain, edema. UEs 5/5 in biceps, triceps, WE grip and finger abd B/L LLE- 5/5 in HF, KE, DF and PF RLE- HF/KE/ 5/5- however lacking 15-20 degrees  Of R knee extension; can get to 95 degrees knee flexion  Skin:    Comments: Right BK wound CI with mild oozing and increased erythema and tibial blisters, superficial necrosis in some areas Neurological:     Mental Status: He is alert and oriented to person, place, and time. Normal CN's    Comments: Light touch intact x 3+ extremities     Assessment/Plan: 1. Functional  deficits which require 3+ hours per day of interdisciplinary therapy in a comprehensive inpatient rehab setting.  Physiatrist is providing close team supervision and 24 hour management of active medical problems listed below.  Physiatrist and rehab team continue to assess barriers to discharge/monitor patient progress toward functional and medical goals  Care Tool:  Bathing    Body parts bathed by patient: Right arm,Left arm,Chest,Abdomen,Front perineal area,Right upper leg,Left upper leg,Face,Buttocks,Left lower leg   Body parts bathed by helper: Buttocks,Left lower leg Body parts n/a: Right lower leg   Bathing assist Assist Level: Set up assist     Upper Body Dressing/Undressing Upper body dressing   What is the patient wearing?: Button up shirt    Upper body assist Assist Level: Independent    Lower Body Dressing/Undressing Lower body dressing      What is the patient wearing?: Underwear/pull up,Pants     Lower body assist Assist for lower body dressing: Independent     Toileting Toileting    Toileting assist Assist for toileting: Supervision/Verbal cueing Assistive Device Comment: urinal   Transfers Chair/bed transfer  Transfers assist     Chair/bed transfer assist level: Supervision/Verbal cueing  Locomotion Ambulation   Ambulation assist      Assist level: Contact Guard/Touching assist Assistive device: Walker-rolling Max distance: 35ft   Walk 10 feet activity   Assist     Assist level: Contact Guard/Touching assist Assistive device: Walker-rolling   Walk 50 feet activity   Assist Walk 50 feet with 2 turns activity did not occur: Safety/medical concerns  Assist level: Contact Guard/Touching assist Assistive device: Walker-rolling    Walk 150 feet activity   Assist Walk 150 feet activity did not occur: Safety/medical concerns         Walk 10 feet on uneven surface  activity   Assist Walk 10 feet on uneven surfaces  activity did not occur: Safety/medical concerns         Wheelchair     Assist Will patient use wheelchair at discharge?: Yes Type of Wheelchair: Manual    Wheelchair assist level: Supervision/Verbal cueing Max wheelchair distance: 150ft    Wheelchair 50 feet with 2 turns activity    Assist        Assist Level: Supervision/Verbal cueing   Wheelchair 150 feet activity     Assist      Assist Level: Supervision/Verbal cueing   Blood pressure (!) 109/40, pulse 66, temperature 97.6 F (36.4 C), resp. rate 17, height 5\' 10"  (1.778 m), weight 66.8 kg, SpO2 98 %.  Medical Problem List and Plan: 1.  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  Impaired mobility: -DVT/anticoagulation:  Pharmaceutical: Continue Lovenox- ambulated 50 feet. Discussed various prosthetic options in the future.              -antiplatelet therapy: ASA/Plavix 3. Pain Management:   gabapentin to tid to help with left foot neuropathic pain, increase to 400mg . Denies right limb phantom limb pain        -wean oxy to 5mg  q12H.  4. Mood: LCSW to follow for evaluation and support.              -antipsychotic agents: N/A 5. Neuropsych: This patient is capable of making decisions on his own behalf. 6. Skin/Wound Care: wound vac off  -continue local wound care 7. Fluids/Electrolytes/Nutrition: Monitor I/O. Check lytes in am. D/c prosource 8. HTN: Monitor BP tid--on Norvasc, HCTZ and Cozaar.   9. AKI: Monitor renal status with serial checks.  Cr normalized, repeat outpatient.  10. T2DM: Hgb A1C-7.7. Monitor BS ac/hs and use SSI for elevated BS.             -Continue Jardiance, metformin (SCr borderline at 1.33)  CBGs 76-237: d/c SSI and increase levemir to 16U, also on Novolog 3U with meals.  11.  Hyponatremia with hypovolemia:  sodium back up to 133  -continue to encourage appropriate fluids  -recheck bmet on Monday 12. CAD s/p PTCA: Monitor for symptoms with increase in activity.             --On DAPT, Imdur, Cozaar and Jardiance.  13. Constipation: d/c Miralax as refusing.  14. Hypokalemia: normalized, repeat outpatient.  15. Anemia: Hgb 11.4 today, repeat Monday.  16. Disposition: Lives with wife who is currently in ICU. Was independent prior to admission. Caregiver training with  daughter today. HFU w/ me 4/14 9:20am. D/c Saturday.    LOS: 9 days A FACE TO FACE EVALUATION WAS PERFORMED  Keith Espinoza Luiscarlos Kaczmarczyk 12/25/2020, 1:59 PM

## 2020-12-25 NOTE — Progress Notes (Addendum)
Physical Therapy Session Note  Patient Details  Name: Keith Espinoza MRN: [id number] Date of Birth: 11-09-1940  Today's Date: 12/25/2020 PT Individual Time: 0700-0809 PT Individual Time Calculation (min): 69 min   Short Term Goals: Week 1:  PT Short Term Goal 1 (Week 1): =LTG due to ELOS  Skilled Therapeutic Interventions/Progress Updates:   Received pt sitting EOB eating breakfast, pt agreeable to therapy, and denied any pain but reported not sleeping well last night; unsure as to why. RN present to administer morning medications. While pt ate discussed D/C plan; pt reporting no concerns but with questions regarding power WC. Therapist explained pt does not require power WC and is able to propel himself in manual WC without assist. Encouraged pt to use manual WC to maintain UE strength. Limb guard donned throughout session. Treatment session with focus on functional mobility/transfers, generalized strengthening, dynamic standing balance/coordination, gait training, and improved activity tolerance. Bed<>WC squat<>pivot with close supervision. Pt performed WC mobility 180ft x 1, 29ft x1, and 35ft x 1 using BUE and supervision. Sit<>stand with RW and min A and pt ambulated 64ft with RW and CGA. Pt demonstrates improvements in L foot clearance and appropriate landing on L heel. Pt performed x 3 additional squat<>pivot/lateral scoot transfers throughout session with supevision with pt able to set up all transfers and manage WC parts with supervision/mod I. Pt transferred sit<>stand with RW and CGA x 2 trials and worked on dynamic standing balance tossing horseshoes x 2 trials with CGA for balance alternating using LUE and RUE. Performed x 2 additional rounds of horsehoes sitting on mat per pt request using RUE. Pt transported back to room in [facility] total A. Concluded session with pt sitting EOB, needs within reach, and bed alarm on.  Therapy Documentation Precautions:  Precautions Precautions:  Fall Precaution Comments: R BKA Required Braces or Orthoses: Other Brace Other Brace: residual limb guard if able to tolerate Restrictions Weight Bearing Restrictions: Yes RLE Weight Bearing: Non weight bearing  Therapy/Group: Individual Therapy Alfonse Alpers PT, DPT   12/25/2020, 6:15 AM

## 2020-12-25 NOTE — Progress Notes (Signed)
Occupational Therapy Session Note  Patient Details  Name: Keith Espinoza MRN: [id number] Date of Birth: [birth year]  Today's Date: 12/25/2020 OT Individual Time: 0830-0930 OT Individual Time Calculation (min): 60 min    Short Term Goals: Week 1:  OT Short Term Goal 1 (Week 1): STGs = LTGs d/t ELOS at Mod I level  Skilled Therapeutic Interventions/Progress Updates:      Pt seen for BADL retraining of toileting, bathing, and dressing with a focus on safe mobility and activity tolerance.  Pt requested to bathe at EOB with basin as the sink in his room in not accessible from a wc level.  Completed bathing with s/u and dressing independently from EOB using lateral leans.  Practiced squat pivot bed >< BSC with S and no cues needed. Pt drew diagram of his home bathroom and pt will need to do transfers to his toilet to his L and back to his wc to his R. He does have a grab bar on his left.  Pt participated well. Opted to rest in bed, bed alarm set and all needs met.   Therapy Documentation Precautions:  Precautions Precautions: Fall Precaution Comments: R BKA Required Braces or Orthoses: Other Brace Other Brace: residual limb guard if able to tolerate Restrictions Weight Bearing Restrictions: Yes RLE Weight Bearing: Non weight bearing    Vital Signs: Therapy Vitals Temp: 98.3 F (36.8 C) Pulse Rate: (!) 55 Resp: 18 BP: 122/60 Patient Position (if appropriate): Lying Oxygen Therapy SpO2: 97 % O2 Device: Room Air Pain: Pain Assessment Pain Score: 0-No pain ADL: ADL Eating: Independent Grooming: Setup Upper Body Bathing: Setup Where Assessed-Upper Body Bathing: Edge of bed Lower Body Bathing: Setup Where Assessed-Lower Body Bathing: Edge of bed Upper Body Dressing: Independent Where Assessed-Upper Body Dressing: Edge of bed Lower Body Dressing: Independent Where Assessed-Lower Body Dressing: Edge of bed Toileting: Supervision/safety Where Assessed-Toileting: Bedside  Commode Toilet Transfer: Distant supervision Toilet Transfer Method: Squat pivot Toilet Transfer Equipment: Drop arm bedside commode   Therapy/Group: Individual Therapy  [place] 12/25/2020, 9:31 AM

## 2020-12-25 NOTE — Progress Notes (Signed)
Occupational Therapy Session Note  Patient Details  Name: Keith Espinoza MRN: [id number] Date of Birth: 03-14-1941  Today's Date: 12/25/2020 OT Individual Time: 17-1430 OT Individual Time Calculation (min): 31 min    Short Term Goals: Week 1:  OT Short Term Goal 1 (Week 1): STGs = LTGs d/t ELOS at Mod I level  Skilled Therapeutic Interventions/Progress Updates:    Pt received asleep in bed, req extensive time to wake up with multimodal cuing, upon awakening, agreeable to therapy. Completed bed mobility ind. Squat-pivot transfer to w/c with set-up to bring new w/c to side of bed and then completing with close S. Pt initially required some demonstration for w/c parts management as this was a new w/c, but then demonstrating ind to manage afterwards. Noted that new w/c was not delivered with anti-tippers, primary PT made aware and to follow up with SW. Donned R limb guard and L shoe with ind. Self-propelled w/c to and from therapy bathroom with close S. Completed squat-pivot to and from toilet with close S and light min A to demonstrate how to drop down arm rest of BSC. Back in room, pt req to stay seated in w/c as daughter was present. RN notified of pt status. Pt with no further questions/concerns re DC at this time.  Pt left in w/c with daughter present and all immediate needs met.    Therapy Documentation Precautions:  Precautions Precautions: Fall Precaution Comments: R BKA Required Braces or Orthoses: Other Brace Other Brace: residual limb guard if able to tolerate Restrictions Weight Bearing Restrictions: Yes RLE Weight Bearing: Non weight bearing Pain: no c/o pain, RN present and had given pain rx per previous request ADL: See Care Tool for more details.   Therapy/Group: Individual Therapy  Volanda Napoleon MS, OTR/L  12/25/2020, 2:39 PM

## 2020-12-25 NOTE — Progress Notes (Signed)
Nutrition Follow-up  DOCUMENTATION CODES:   Severe malnutrition in context of chronic illness  INTERVENTION:   - Continue Glucerna Shake po TID, each supplement provides 220 kcal and 10 grams of protein  - Continue Magic Cup TID with meals, each supplement provides 290 kcal and 9 grams of protein  - Diet education provided and "High Calorie, High Protein Nutrition Therapy" handout provided  - Encourage adequate PO intake  NUTRITION DIAGNOSIS:   Severe Malnutrition related to chronic illness (CAD, T2DM) as evidenced by severe fat depletion,severe muscle depletion.  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 Pt admitted to acute care on 12/12/20 for right BKA. Admitted to CIR on 3/15.  Noted target d/c date of 3/26.  Spoke with pt and daughter at bedside. Pt expresses frustration regarding different menu at CIR vs downstairs. Explained difference between Host/Hostess menu and Room Service menu. Showed pt the always available options on the menu.  Pt reports that he likes the Glucerna shakes but has only received a few since admission. RD provided pt with one at time of visit. RN aware.  Pt reports fair PO intake PTA. Pt's daughter agrees. Pt states that he doesn't do much so he doesn't have much of an appetite. Discussed importance of adequate kcal and protein intake in maintaining lean muscle mass and promoting healing. Pt and daughter express  understanding. RD provided pt's daughter with "High Calorie, High Protein Nutrition Therapy" handout from the [employer]. Pt's daughter plans to purchase Glucerna shakes for pt to consume at home.  Pt reports weight loss that started last summer when he started on Trulicity. Pt reports his highest weight was 260 lbs but he had been maintaining at 200 lbs for some time. Pt repots that last summer he lost weight down to 160 lbs due to complete lack of appetite from Trulicity. He states that he was never able to gain this weight back.  Meal Completion: 25-100% x last 8 meals (averaging 74%)  Medications reviewed and include: cholecalciferol, jardiance, Glucerna Shake TID, SSI, levemir 16 units daily, metformin, vitamin B-12  Labs reviewed: sodium 134, BUN 27 CBG's: 76-237 x 24 hours  UOP: 975 ml x 24 hours  NUTRITION - FOCUSED PHYSICAL EXAM:  Flowsheet Row Most Recent Value  Orbital Region Severe depletion  Upper Arm Region Severe depletion  Thoracic and Lumbar Region Moderate depletion  Buccal Region Severe depletion  Temple Region Severe depletion  Clavicle Bone Region Severe depletion  Clavicle and Acromion Bone Region Severe depletion  Scapular Bone Region Unable to assess  Dorsal Hand Severe depletion  Patellar Region Moderate depletion  Anterior Thigh Region Severe depletion  Posterior Calf Region Moderate depletion  Edema (RD Assessment) None  Hair Reviewed  Eyes Reviewed  Mouth Reviewed  Skin Reviewed  Nails Reviewed       Diet Order:   Diet Order            Diet Carb Modified Fluid consistency: Thin; Room service appropriate? Yes  Diet effective now                 EDUCATION NEEDS:   Education needs have been addressed  Skin:  Skin Assessment: Skin Integrity Issues: Incisions: right leg s/p BKA  Last BM:  12/22/20 large type 4  Height:   Ht Readings from Last 1 Encounters:  12/16/20 5\' 10"  (1.778 m)    Weight:   Wt  Readings from Last 1 Encounters:  12/23/20 66.8 kg    BMI:  Body mass index is 21.13 kg/m.  Estimated Nutritional Needs:   Kcal:  1800-2000  Protein:  85-100 grams  Fluid:  1.8-2.0 L    Gustavus Bryant, MS, RD, LDN Inpatient Clinical Dietitian Please see AMiON for contact information.

## 2020-12-26 DIAGNOSIS — E43 Unspecified severe protein-calorie malnutrition: Secondary | ICD-10-CM | POA: Insufficient documentation

## 2020-12-26 DIAGNOSIS — R7989 Other specified abnormal findings of blood chemistry: Secondary | ICD-10-CM

## 2020-12-26 DIAGNOSIS — D62 Acute posthemorrhagic anemia: Secondary | ICD-10-CM

## 2020-12-26 LAB — GLUCOSE, CAPILLARY
Glucose-Capillary: 182 mg/dL — ABNORMAL HIGH (ref 70–99)
Glucose-Capillary: 161 mg/dL — ABNORMAL HIGH (ref 70–99)
Glucose-Capillary: 207 mg/dL — ABNORMAL HIGH (ref 70–99)
Glucose-Capillary: 267 mg/dL — ABNORMAL HIGH (ref 70–99)

## 2020-12-26 MED ORDER — GABAPENTIN 400 MG PO CAPS: 400.0000 mg | ORAL_CAPSULE | Freq: Three times a day (TID) | ORAL | 0 refills | Status: DC

## 2020-12-26 MED ORDER — LOSARTAN POTASSIUM 50 MG PO TABS: 50.0000 mg | ORAL_TABLET | Freq: Every day | ORAL | 0 refills | Status: DC

## 2020-12-26 MED ORDER — CLOPIDOGREL BISULFATE 75 MG PO TABS: 75.0000 mg | ORAL_TABLET | Freq: Every day | ORAL | 0 refills | Status: DC

## 2020-12-26 MED ORDER — METHOCARBAMOL 500 MG PO TABS
500.0000 mg | ORAL_TABLET | Freq: Four times a day (QID) | ORAL | 0 refills | Status: DC | PRN
Start: 2020-12-26 — End: 2021-10-06

## 2020-12-26 MED ORDER — TRAZODONE HCL 50 MG PO TABS: 25.0000 mg | ORAL_TABLET | Freq: Every evening | ORAL | 0 refills | Status: DC | PRN

## 2020-12-26 MED ORDER — TRAMADOL HCL 50 MG PO TABS: 50.0000 mg | ORAL_TABLET | Freq: Four times a day (QID) | ORAL | 0 refills | Status: DC | PRN

## 2020-12-26 MED ORDER — ISOSORBIDE MONONITRATE ER 30 MG PO TB24: 30.0000 mg | ORAL_TABLET | Freq: Every day | ORAL | 0 refills | Status: DC

## 2020-12-26 MED ORDER — AMLODIPINE BESYLATE 5 MG PO TABS: 5.0000 mg | ORAL_TABLET | Freq: Every day | ORAL | 0 refills | Status: DC

## 2020-12-26 MED ORDER — TERAZOSIN HCL 5 MG PO CAPS: 5.0000 mg | ORAL_CAPSULE | Freq: Every day | ORAL | 0 refills | Status: DC

## 2020-12-26 MED ORDER — DOXYCYCLINE HYCLATE 100 MG PO TABS: 100.0000 mg | ORAL_TABLET | Freq: Two times a day (BID) | ORAL | 0 refills | Status: DC

## 2020-12-26 MED ORDER — HYDROCHLOROTHIAZIDE 12.5 MG PO TABS: 12.5000 mg | ORAL_TABLET | Freq: Every day | ORAL | 0 refills | Status: DC

## 2020-12-26 MED ORDER — GABAPENTIN 100 MG PO CAPS
200.0000 mg | ORAL_CAPSULE | Freq: Every day | ORAL | Status: DC
  Administered 2020-12-26: 200 mg via ORAL
  Filled 2020-12-26: qty 2

## 2020-12-26 MED ORDER — ACETAMINOPHEN 325 MG PO TABS: 325.0000 mg | ORAL_TABLET | ORAL | Status: DC | PRN

## 2020-12-26 MED ORDER — INSULIN DETEMIR 100 UNIT/ML ~~LOC~~ SOLN
17.0000 [IU] | Freq: Every day | SUBCUTANEOUS | Status: DC
  Administered 2020-12-26: 17 [IU] via SUBCUTANEOUS
  Filled 2020-12-26 (×3): qty 0.17

## 2020-12-26 MED ORDER — OXYCODONE HCL 5 MG PO TABS: 5.0000 mg | ORAL_TABLET | Freq: Two times a day (BID) | ORAL | 0 refills | Status: DC | PRN

## 2020-12-26 MED ORDER — GABAPENTIN 100 MG PO CAPS: 200.0000 mg | ORAL_CAPSULE | Freq: Every day | ORAL | 0 refills | Status: DC

## 2020-12-26 NOTE — Discharge Instructions (Signed)
Inpatient Rehab Discharge Instructions  Keith Espinoza Discharge date and time: 12/27/20   Activities/Precautions/ Functional Status: Activity: no lifting, driving, or strenuous exercise till cleared by MD Diet: cardiac diet Wound Care: Wash with soap and water. Keep clean and dry. Wear stump shrinker daily.  Contact MD if you develop any problems with your incision/wound--redness, swelling, increase in pain, drainage or if you develop fever or chills.    Functional status:  ___ No restrictions     ___ Walk up steps independently _X__ 24/7 supervision/assistance   ___ Walk up steps with assistance ___ Intermittent supervision/assistance  ___ Bathe/dress independently ___ Walk with walker     _X__ Bathe/dress with assistance ___ Walk Independently    ___ Shower independently ___ Walk with assistance    ___ Shower with assistance _X__ No alcohol     ___ Return to work/school ________   Special Instructions: 1. Check blood sugars before meals and at bedtime. Use your insulin scale as at home.  UPON DISCHARGE:    Home Health:   PT, OT, RN                  [place] Equipment/Items Ordered: [place]                                                 Agency/Supplier:[facility]   [phone]   My questions have been answered and I understand these instructions. I will adhere to these goals and the provided educational materials after my discharge from the hospital.  Patient/Caregiver Signature _______________________________ Date __________  Clinician Signature _______________________________________ Date __________  Please bring this form and your medication list with you to all your follow-up doctor's appointments. COMMUNITY REFERRALS

## 2020-12-26 NOTE — Progress Notes (Signed)
Occupational Therapy Session Note  Patient Details  Name: Keith Espinoza MRN: [id number] Date of Birth: May 29, 1941  Today's Date: 12/26/2020 OT Individual Time: 1030-1130 OT Individual Time Calculation (min): 60 min    Short Term Goals: Week 1:  OT Short Term Goal 1 (Week 1): STGs = LTGs d/t ELOS at Mod I level  Skilled Therapeutic Interventions/Progress Updates:    Pt received in bed sleeping but awoke easily.  Agreeable to completed b/d EOB and working with new chair on transfers.  Pt completed all self care.  Pt commented that his new wc has long extension handles that might get in the way. Therapist demonstrated to pt how to adjust wc close to his transfer surface by keeping chair 2 inches away, locking brake next to transfer surface, removing extension handle and then moving opposite wheel to move chair more adjacent then locking other break.  And how to repeat in opposite direction. Pt stated he was too tired to try as he already did a lot of transfers earlier.   Agreeable to working on R knee extension AROM in both sit and supine with isometric holds.  Pt able to complete about 10 min of exercises.  Pt stated he is ready to go home tomorrow.    He opted to sit on EOB, bed alarm set and all needs met.   Therapy Documentation Precautions:  Precautions Precautions: Fall Precaution Comments: R BKA Required Braces or Orthoses: Other Brace Other Brace: limb guard Restrictions Weight Bearing Restrictions: Yes RLE Weight Bearing: Non weight bearing    Vital Signs: Therapy Vitals BP: 136/88 Pain: Pain Assessment Pain Score: 3  Pain Type: Surgical pain;Neuropathic pain Pain Location: Leg Pain Orientation: Right Pain Descriptors / Indicators: Aching Pain Onset: On-going Pain Intervention(s):  (premedicated) ADL: ADL Eating: Independent Grooming: Independent Upper Body Bathing: Setup Where Assessed-Upper Body Bathing: Edge of bed Lower Body Bathing: Setup Where  Assessed-Lower Body Bathing: Edge of bed Upper Body Dressing: Independent Where Assessed-Upper Body Dressing: Edge of bed Lower Body Dressing: Independent Where Assessed-Lower Body Dressing: Edge of bed Toileting: Modified independent Where Assessed-Toileting: Bedside Commode Toilet Transfer: Modified independent Toilet Transfer Method: Market researcher Equipment: Drop arm bedside commode   Therapy/Group: Individual Therapy  SAGUIER,JULIA 12/26/2020, 11:32 AM

## 2020-12-26 NOTE — Progress Notes (Addendum)
PROGRESS NOTE   Subjective/Complaints: Keith Espinoza continues to complain of residual limb pain that made it hard for him to sleep at night. Agreeable to increasing HS Gabapentin to 600mg . Oxycodone impairs his cognition, will d/c Granddaughter at bedside Discussed d/c time tomorrow and f/u with Dr. Sharol Given and myself.   ROS: Patient denies fever, rash, sore throat, blurred vision, nausea, vomiting, diarrhea, cough, shortness of breath or chest pain,  headache, or mood change. +residual limb pain. +neuropathic pain in left foot  Objective:   No results found. No results for input(s): WBC, HGB, HCT, PLT in the last 72 hours. No results for input(s): NA, K, CL, CO2, GLUCOSE, BUN, CREATININE, CALCIUM in the last 72 hours.  Intake/Output Summary (Last 24 hours) at 12/26/2020 1240 Last data filed at 12/26/2020 0731 Gross per 24 hour  Intake 870 ml  Output 1625 ml  Net -755 ml        Physical Exam: Vital Signs Blood pressure 136/88, pulse 63, temperature 98.1 F (36.7 C), resp. rate 18, height 5\' 10"  (1.778 m), weight 66.8 kg, SpO2 96 %. Gen: no distress, normal appearing HEENT: oral mucosa pink and moist, NCAT Cardio: Reg rate Chest: normal effort, normal rate of breathing Abd: soft, non-distended Ext: no edema Psych: pleasant, normal affect Skin: intact Musculoskeletal:     Cervical back: Normal range of motion and neck supple.     Comments: R-BKA with pain, edema. UEs 5/5 in biceps, triceps, WE grip and finger abd B/L LLE- 5/5 in HF, KE, DF and PF RLE- HF/KE/ 5/5- however lacking 15-20 degrees  Of R knee extension; can get to 95 degrees knee flexion  Skin:    Comments: Right BK wound CI with mild oozing and increased erythema and tibial blisters, superficial necrosis in some areas Neurological:     Mental Status: He is alert and oriented to person, place, and time. Normal CN's    Comments: Light touch intact x 3+  extremities     Assessment/Plan: 1. Functional deficits which require 3+ hours per day of interdisciplinary therapy in a comprehensive inpatient rehab setting.  Physiatrist is providing close team supervision and 24 hour management of active medical problems listed below.  Physiatrist and rehab team continue to assess barriers to discharge/monitor patient progress toward functional and medical goals  Care Tool:  Bathing    Body parts bathed by patient: Right arm,Left arm,Chest,Abdomen,Front perineal area,Right upper leg,Left upper leg,Face,Buttocks,Left lower leg   Body parts bathed by helper: Buttocks,Left lower leg Body parts n/a: Right lower leg   Bathing assist Assist Level: Set up assist     Upper Body Dressing/Undressing Upper body dressing   What is the patient wearing?: Button up shirt    Upper body assist Assist Level: Independent    Lower Body Dressing/Undressing Lower body dressing      What is the patient wearing?: Underwear/pull up,Pants     Lower body assist Assist for lower body dressing: Independent     Toileting Toileting    Toileting assist Assist for toileting: Independent with assistive device Assistive Device Comment: urinal   Transfers Chair/bed transfer  Transfers assist     Chair/bed transfer assist level:  Independent with assistive device     Locomotion Ambulation   Ambulation assist      Assist level: Contact Guard/Touching assist Assistive device: Walker-rolling Max distance: 45ft   Walk 10 feet activity   Assist     Assist level: Contact Guard/Touching assist Assistive device: Walker-rolling   Walk 50 feet activity   Assist Walk 50 feet with 2 turns activity did not occur: Safety/medical concerns  Assist level: Contact Guard/Touching assist Assistive device: Walker-rolling    Walk 150 feet activity   Assist Walk 150 feet activity did not occur: Safety/medical concerns (R BKA, fatigue, weakness)          Walk 10 feet on uneven surface  activity   Assist Walk 10 feet on uneven surfaces activity did not occur: Safety/medical concerns (R BKA, fatigue, weakness)         Wheelchair     Assist Will patient use wheelchair at discharge?: Yes Type of Wheelchair: Manual    Wheelchair assist level: Independent Max wheelchair distance: 136ft    Wheelchair 50 feet with 2 turns activity    Assist        Assist Level: Independent   Wheelchair 150 feet activity     Assist      Assist Level: Independent   Blood pressure 136/88, pulse 63, temperature 98.1 F (36.7 C), resp. rate 18, height 5\' 10"  (1.778 m), weight 66.8 kg, SpO2 96 %.  Medical Problem List and Plan: 1.  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  Impaired mobility: -DVT/anticoagulation:  Pharmaceutical: Continue Lovenox- ambulated 50 feet. Discussed various prosthetic options in the future. D/c Lovneox upon CIR discharge.              -antiplatelet therapy: ASA/Plavix 3. Pain Management:   gabapentin to tid to help with left foot neuropathic pain, increase to 600mg . Denies right limb phantom limb pain        -wean oxy to 5mg  q12H.  4. Mood: LCSW to follow for evaluation and support.              -antipsychotic agents: N/A 5. Neuropsych: This patient is capable of making decisions on his own behalf. 6. Skin/Wound Care: wound vac off  -continue local wound care 7. Fluids/Electrolytes/Nutrition: Monitor I/O. Check lytes in am. D/c prosource 8. HTN: Monitor BP tid--on Norvasc, HCTZ and Cozaar.   9. AKI: Monitor renal status with serial checks.  Cr normalized, repeat outpatient.  10. T2DM: Hgb A1C-7.7. Monitor BS ac/hs and use SSI for elevated BS.             -Continue Jardiance, metformin (SCr  borderline at 1.33)  3/25: d/c SSI and increase levemir to 17U, also on Novolog 3U with meals.  11. Hyponatremia with hypovolemia:  sodium back up to 133  -continue to encourage appropriate fluids  -recheck bmet outpatient 12. CAD s/p PTCA: Monitor for symptoms with increase in activity.             --On DAPT, Imdur, Cozaar and Jardiance.  13. Constipation: d/c Miralax as refusing.  14. Hypokalemia: normalized, repeat outpatient.  15. Anemia: Hgb 11.4 today, repeat outpatient.  16.  Disposition: Lives with wife who is currently in ICU. Was independent prior to admission. Caregiver training with granddaughter today. Left voicemail to schedule hospital f/u. D/c Saturday.     LOS: 10 days A FACE TO FACE EVALUATION WAS PERFORMED  Keith Espinoza 12/26/2020, 12:40 PM

## 2020-12-26 NOTE — Progress Notes (Signed)
Occupational Therapy Discharge Summary  Patient Details  Name: Keith Espinoza MRN: [id number] Date of Birth: 1941/03/08     Patient has met 10 of 10 long term goals due to improved activity tolerance, improved balance and ability to compensate for deficits.  Patient to discharge at overall Modified Independent level.  Patient's care partner is independent to provide the necessary physical assistance at discharge.    Reasons goals not met: n/a  Recommendation:  Patient will benefit from ongoing skilled OT services in home health setting to continue to advance functional skills in the area of BADL and iADL.  Equipment: drop arm BSC  Reasons for discharge: treatment goals met  Patient/family agrees with progress made and goals achieved: Yes  OT Discharge Precautions/Restrictions  Precautions Precautions: Fall Precaution Comments: R BKA Other Brace: limb guard    Vital Signs Therapy Vitals Temp: 97.7 F (36.5 C) Pulse Rate: 67 Resp: 18 BP: (!) 107/54 Patient Position (if appropriate): Sitting Oxygen Therapy SpO2: 100 % O2 Device: Room Air Pain Pain Assessment Pain Score: 3  Pain Type: Surgical pain;Neuropathic pain Pain Location: Leg Pain Orientation: Right Pain Descriptors / Indicators: Aching Pain Onset: On-going Pain Intervention(s):  (premedicated) ADL ADL Eating: Independent Grooming: Independent Upper Body Bathing: Setup Where Assessed-Upper Body Bathing: Edge of bed Lower Body Bathing: Setup Where Assessed-Lower Body Bathing: Edge of bed Upper Body Dressing: Independent Where Assessed-Upper Body Dressing: Edge of bed Lower Body Dressing: Independent Where Assessed-Lower Body Dressing: Edge of bed Toileting: Modified independent Where Assessed-Toileting: Bedside Commode Toilet Transfer: Modified independent Toilet Transfer Method: Engineer, water: Drop arm bedside commode Vision Patient Visual Report: No change from  baseline Vision Assessment?: No apparent visual deficits Perception  Perception: Within Functional Limits Praxis Praxis: Intact Cognition Overall Cognitive Status: Within Functional Limits for tasks assessed Sensation Sensation Light Touch: Impaired by gross assessment Proprioception: Impaired by gross assessment Coordination Gross Motor Movements are Fluid and Coordinated: No Fine Motor Movements are Fluid and Coordinated: Yes Coordination and Movement Description: grossly uncoordinated due to R BKA, generalized weakness, decreased balance/postural control, and decreased endurance Finger Nose Finger Test: [facility] bilaterally Motor  Motor Motor - Skilled Clinical Observations: grossly uncoordinated due to R BKA, generalized weakness, decreased balance/postural control, and decreased endurance Mobility    mod I squat pivot transfers Trunk/Postural Assessment  Postural Control Postural Control: Deficits on evaluation  Balance Static Sitting Balance Static Sitting - Level of Assistance: 7: Independent Dynamic Sitting Balance Dynamic Sitting - Level of Assistance: 6: Modified independent (Device/Increase time) Static Standing Balance Static Standing - Level of Assistance: 5: Stand by assistance Dynamic Standing Balance Dynamic Standing - Level of Assistance: 5: Stand by assistance Extremity/Trunk Assessment RUE Assessment RUE Assessment: Within Functional Limits General Strength Comments: 4/5 grossly LUE Assessment LUE Assessment: Within Functional Limits General Strength Comments: 4/5 grossly   Arley 12/26/2020, 1:04 PM

## 2020-12-26 NOTE — Progress Notes (Signed)
Physical Therapy Session Note  Patient Details  Name: Keith Espinoza MRN: [id number] Date of Birth: Sep 14, 1941  Today's Date: 12/26/2020 PT Individual Time: 0800-0909 and 9528-4132  PT Individual Time Calculation (min): 69 min and 30 min  Short Term Goals: Week 1:  PT Short Term Goal 1 (Week 1): =LTG due to ELOS  Skilled Therapeutic Interventions/Progress Updates:   Treatment Session 1: 4401-0272 69 min Received pt sitting EOB, pt agreeable to therapy, and reported pain 5/10 in R residual limb and reported not sleeping well last night due to pain. RN notified and present to administer pain medications. Session with emphasis on functional mobility/transfers, generalized strengthening, dynamic standing balance/coordination, ambulation, and improved activity tolerance. Pt donned L shoe, WC gloves, and limb guard mod I and able to set up all transfers with mod I. Pt performed x 3 squat<>pivot transfers with mod I throughout session. Pt performed WC mobility 170ft x 1 and 9ft x 1 mod I. Pt performed the following exercises sitting on mat with supervision and verbal cues for technique: -tricep extensions on yoga blocks 3x10 -bicep curls with 5lb dumbbell 3x10 bilaterally  -ER with 5lb dumbbell 2x10 bilaterally -overhead chest press with 9lb dowel 3x10 -horizontal chest press with 9lb dowel 3x10 Sit<>stand with RW and CGA x 3 trials and performed the following exercises standing with BUE support on RW: -R hip flexion 1x15 and 1x25 -R hip abduction 1x15 and 1x20 -R hip extension 2x15 Pt ambulated 72ft with RW and CGA with 2 turns with emphasis on turning sequencing and RW management with cues to clear LLE. Pt transported back to room in [facility] total A and requested to go down to ICU to visit wife; NT aware and planning to escort pt. Concluded session with pt sitting in WC, needs within reach, and chair pad alarm on.   Treatment Session 2: 5366-4403 30 min Received pt sitting in WC, pt agreeable to  therapy, and denied any pain during session. Session with emphasis on functional mobility/transfers, generalized strengthening, dynamic standing balance/coordination, and improved activity tolerance. Pt transported to 4W dayroom in [facility] total A for time management purposes. Sit<>stand with RW and CGA x 2 trials and worked on dynamic standing balance playing cornhole x 2 trials using RUE with CGA/min A for balance. Pt able to stand ~3 minutes each trial. Pt transported back to room in [facility] total A. Concluded session with pt sitting in WC, needs within reach, and chair pad alarm on. NT notified to take pt to ICU to visit wife.   Therapy Documentation Precautions:  Precautions Precautions: Fall Precaution Comments: R BKA Required Braces or Orthoses: Other Brace Other Brace: limb guard Restrictions Weight Bearing Restrictions: Yes RLE Weight Bearing: Non weight bearing  Therapy/Group: Individual Therapy Alfonse Alpers PT, DPT   12/26/2020, 7:38 AM

## 2020-12-26 NOTE — Progress Notes (Signed)
Physical Therapy Session Note  Patient Details  Name: KEJUAN BEKKER MRN: [id number] Date of Birth: Sep 14, 1941  Today's Date: 12/26/2020 PT Individual Time: 1300-1330 PT Individual Time Calculation (min): 30 min   Short Term Goals: Week 2:     Skilled Therapeutic Interventions/Progress Updates:   Patient received supine in bed, agreeable to PT. He reports 2/10 pain in R RL, premedicated. PT providing rest breaks and distractions to assist with pain management. He was able to come sit edge of bed ModI and transfer to wc, including wc set up with supervision via lateral scoot. Patient propelling himself >211ft using B UE ModI. UE erogometer completed 2x2' forward, 2x2' backward with limited back support for increased core activation. Patient able to propel himself back to his room and transfer to bed via lateral scoot with supervision, returning to supine ModI. Bed alarm on, call light within reach.    Therapy Documentation Precautions:  Precautions Precautions: Fall Precaution Comments: R BKA Required Braces or Orthoses: Other Brace Other Brace: limb guard Restrictions Weight Bearing Restrictions: Yes RLE Weight Bearing: Non weight bearing    Therapy/Group: Individual Therapy  Karoline Caldwell, PT, DPT, CBIS  12/26/2020, 7:50 AM

## 2020-12-26 NOTE — Progress Notes (Signed)
Inpatient Rehabilitation Care Coordinator Discharge Note  The overall goal for the admission was met for: DC SAT 3/26  Discharge location: [place]  Length of Stay: Yes-11 DAYS  Discharge activity level: Yes-INDEPENDENT WITH DEVICE  Home/community participation: Yes  Services provided included: MD, RD, PT, OT, RN, CM, Pharmacy, Neuropsych and SW  Financial Services: Medicare and Private Insurance: [place] offered to/list presented to:YES  Follow-up services arranged: Home Health: [facility]-PT,OT,RN, DME: [facility]-DROP-ARM BEDSIDE COMMODE AND WHEELCHAIR and Patient/Family request agency HH: HAS USED [place] IN THE PAST, DME: NO PREF  Comments (or additional information):DAUGHTER WAS HERE FOR EDUCATION AND IT WENT WELL. BOTH FEEL PREPARED FOR DC SAT.  Patient/Family verbalized understanding of follow-up arrangements: Yes  Individual responsible for coordination of the follow-up plan: [facility] [phone]  Confirmed correct DME delivered: Elease Hashimoto 12/26/2020    Jamiel Goncalves, Gardiner Rhyme

## 2020-12-26 NOTE — Progress Notes (Signed)
Physical Therapy Discharge Summary  Patient Details  Name: Keith Espinoza MRN: [id number] Date of Birth: 11-03-1940  Patient has met 8 of 8 long term goals due to improved activity tolerance, improved balance, improved postural control, increased strength, increased range of motion, decreased pain, improved awareness and improved coordination. Patient to discharge at a wheelchair level Modified Independent.  Patient's care partner is independent to provide the necessary physical assistance at discharge. Pt's daughter attended family education training on 3/23 and verbalized and demonstrated confidence with all tasks to ensure safe discharge home. Pt demonstrates good safety awareness and has no further questions regarding mobility.   All goals met   Recommendation:  Patient will benefit from ongoing skilled PT services in home health setting to continue to advance safe functional mobility, address ongoing impairments in transfers, generalized strengthening, dynamic standing balance/coordination, gait training, endurance, and to minimize fall risk.  Equipment: 16x16 manual WC with R amputee support pad  Reasons for discharge: treatment goals met  Patient/family agrees with progress made and goals achieved: Yes  PT Discharge Precautions/Restrictions Precautions Precautions: Fall Precaution Comments: R BKA Required Braces or Orthoses: Other Brace Other Brace: limb guard Restrictions Weight Bearing Restrictions: Yes RLE Weight Bearing: Non weight bearing Cognition Overall Cognitive Status: Within Functional Limits for tasks assessed Arousal/Alertness: Awake/alert Orientation Level: Oriented X4 Memory: Appears intact Awareness: Appears intact Problem Solving: Appears intact Safety/Judgment: Appears intact Sensation Sensation Light Touch: Impaired by gross assessment Proprioception: Impaired by gross assessment Additional Comments: decreased sensation along medial R knee, absent  sensation along L medial malleoli and L great toe and along incision on R limb. Coordination Gross Motor Movements are Fluid and Coordinated: No Fine Motor Movements are Fluid and Coordinated: Yes Coordination and Movement Description: grossly uncoordinated due to R BKA, generalized weakness, decreased balance/postural control, and decreased endurance Finger Nose Finger Test: [facility] bilaterally Heel Shin Test: WFL on LLE; unable to perform on RLE due to BKA Motor  Motor Motor: Abnormal postural alignment and control Motor - Skilled Clinical Observations: grossly uncoordinated due to R BKA, generalized weakness, decreased balance/postural control, and decreased endurance  Mobility Bed Mobility Bed Mobility: Rolling Right;Rolling Left;Supine to Sit;Sit to Supine Rolling Right: Independent Rolling Left: Independent Supine to Sit: Independent Sit to Supine: Independent Transfers Transfers: Sit to Stand;Stand to Sit;Stand Psychologist, prison and probation services Transfers;Lateral/Scoot Transfers Sit to Stand: Contact Guard/Touching assist Stand to Sit: Contact Guard/Touching assist Stand Pivot Transfers: Contact Guard/Touching assist Stand Pivot Transfer Details: Verbal cues for safe use of DME/AE Squat Pivot Transfers: Independent with assistive device Lateral/Scoot Transfers: Independent with assistive device Transfer (Assistive device): Rolling walker Locomotion  Gait Ambulation: Yes Gait Assistance: Contact Guard/Touching assist Gait Distance (Feet): 50 Feet Assistive device: Rolling walker Gait Gait: Yes Gait Pattern: Impaired Gait Pattern: Step-to pattern;Decreased step length - left;Decreased dorsiflexion - left;Poor foot clearance - left Gait velocity: decreased Stairs / Additional Locomotion Stairs: No Architect: Yes Wheelchair Assistance: Independent with Camera operator: Both upper extremities Wheelchair Parts Management:  Independent Distance: 12f  Trunk/Postural Assessment  Cervical Assessment Cervical Assessment: Exceptions to [facility](forward head) Thoracic Assessment Thoracic Assessment: Exceptions to [facility](mild kyphosis) Lumbar Assessment Lumbar Assessment: Exceptions to [facility](posterior pelvic tilt) Postural Control Postural Control: Deficits on evaluation  Balance Balance Balance Assessed: Yes Static Sitting Balance Static Sitting - Balance Support: Feet supported;No upper extremity supported Static Sitting - Level of Assistance: 7: Independent Dynamic Sitting Balance Dynamic Sitting - Balance Support: Feet supported;No upper extremity supported Dynamic  Sitting - Level of Assistance: 6: Modified independent (Device/Increase time) Static Standing Balance Static Standing - Balance Support: Bilateral upper extremity supported (RW) Static Standing - Level of Assistance: 5: Stand by assistance (close supervision) Dynamic Standing Balance Dynamic Standing - Balance Support: Bilateral upper extremity supported (RW) Dynamic Standing - Level of Assistance: 5: Stand by assistance (CGA) Extremity Assessment  RLE Assessment RLE Assessment: Exceptions to [facility] Active Range of Motion (AROM) Comments: pt able to achieve almost full knee extension General Strength Comments: grossly generalized to 3+/5 (hip flexion, knee flexion/extension, hip abd/add). Pain with knee flexion LLE Assessment LLE Assessment: Exceptions to [facility] General Strength Comments: grossly generalized to 4+/5  Alfonse Alpers PT, DPT  12/26/2020, 7:35 AM

## 2020-12-26 NOTE — Discharge Summary (Signed)
Physician Discharge Summary  Patient ID: Keith Espinoza MRN: [id number] DOB/AGE: May 27, 1941 80 y.o.  Admit date: 12/16/2020 Discharge date: 12/27/2020  Discharge Diagnoses:  Principal Problem:   Below-knee amputation of right lower extremity ([place]) Active Problems:   Essential hypertension, benign   Insulin dependent type 2 diabetes mellitus (HCC)   Diabetic neuropathy (HCC)   Protein-calorie malnutrition, severe   Acute blood loss anemia   Prerenal azotemia   Discharged Condition: stable   Significant Diagnostic Studies: No results found.  Labs:  Basic Metabolic Panel: Recent Labs  Lab 12/20/20 0505 12/21/20 0512 12/22/20 0352  NA 133* 133* 134*  K 3.4* 3.4* 3.6  CL 95* 92* 94*  CO2 30 32 31  GLUCOSE 143* 116* 129*  BUN 25* 24* 27*  CREATININE 1.13 1.03 1.14  CALCIUM 9.5 9.7 9.8    CBC: CBC Latest Ref Rng & Units 12/22/2020 12/17/2020 12/12/2020  WBC 4.0 - 10.5 K/uL 8.5 8.6 10.4  Hemoglobin 13.0 - 17.0 g/dL 12.1(L) 11.4(L) 13.4  Hematocrit 39.0 - 52.0 % 34.3(L) 31.7(L) 39.1  Platelets 150 - 400 K/uL 256 179 190    CBG: Recent Labs  Lab 12/25/20 1624 12/25/20 2056 12/26/20 0559 12/26/20 1114 12/26/20 1621  GLUCAP 263* 216* 182* 161* 207*    Brief HPI:   Keith Espinoza is a 80 y.o. 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  He had failed attempts at limb salvage and was admitted on 12/13/2026 for right BKA by Dr. Sharol Given.  Postop has had issues with pain control, acute renal failure as well as hyponatremia with poorly controlled blood sugars.  Therapy was initiated and patient was noted to have limitations in mobility and ADLs due to pain as well as weakness.  CIR was recommended due to functional decline.   Hospital Course: Keith Espinoza was admitted to rehab 12/16/2020 for  inpatient therapies to consist of PT and OT at least three hours five days a week. Past admission physiatrist, therapy team and rehab RN have worked together to provide customized collaborative inpatient rehab. his blood pressures were monitored on TID basis and have been controlled on home regimen. His diabetes has been monitored with ac/hs CBG checks and SSI was use prn for tighter BS control.  Po intake has been good and blood sugars have been labile therefore Levemir has been titrated upwards. He was advised to monitor blood sugars on achs basis and resume his home regimen of Levemir 12-20 units sliding scale at discharge.  Pain has been reasonably controlled with as needed use of oxycodone and he has been educated on desensitizing residual limb.  He continues to report transitioning to significant neuropathy and gabapentin has been titrated upwards with increase in HS dose to help manage insomnia.  Wound VAC was removed and he was noted to develop some drainage from the wound with concerns of ischemia medial edges.  Doxycycline was added for wound prophylaxis per Ortho input.  Nutritional supplements were ordered to help promote wound healing. Follow up BMET showed that hyponatremia is resolving with prerenal azotemia.  Serial CBC shows acute blood loss anemia which is stable overall.  He has made steady progress during his stay and is currently modified independent at wheelchair level.  He will continue to receive follow-up home health PT, OT and RN by [place] after discharge.  Rehab course: During patient's stay in rehab weekly team conferences were held to monitor patient's progress, set goals and discuss barriers to discharge. At admission, patient required min assist with mobility and with ADL tasks. He has had improvement in activity tolerance, balance, postural control as well as ability to compensate for deficits. He is able to complete ADL tasks at modified independent level. He is  modified independent for transfers and requires CGA to ambulate 62' with RW. He shows good safety awareness and family education has been completed with daughter.    Disposition: Home.   Diet: Diabetic  Special Instructions: 1. Monitor blood sugars before meals and at bedtime.  2. Continue home levemir regimen and follow up with PCP for further adjustment.  3.  Recommend follow-up be met in 1 to 2 weeks to monitor sodium as well as renal status.  Allergies as of 12/26/2020      Reactions   Simvastatin Other (See Comments)   SEVERE MYALGIAS   Zetia [ezetimibe] Other (See Comments)   MYALGIAS   Dilaudid [hydromorphone Hcl] Other (See Comments)   hallucination      Medication List    STOP taking these medications   Klor-Con M20 20 MEQ tablet Generic drug: potassium chloride SA     TAKE these medications   acetaminophen 325 MG tablet Commonly known as: TYLENOL Take 1-2 tablets (325-650 mg total) by mouth every 4 (four) hours as needed for mild pain.   allopurinol 300 MG tablet Commonly known as: ZYLOPRIM Take 300 mg by mouth daily.   amLODipine 5 MG tablet Commonly known as: NORVASC Take 1 tablet (5 mg total) by mouth daily.   aspirin EC 81 MG tablet Take 81 mg by mouth daily.   B-12 2500 MCG Tabs Take 2,500 mcg by mouth daily.   clopidogrel 75 MG tablet Commonly known as: PLAVIX Take 1 tablet (75 mg total) by mouth daily.   doxycycline 100 MG tablet Commonly known as: VIBRA-TABS Take 1 tablet (100 mg total) by mouth every 12 (twelve) hours.   empagliflozin 25 MG Tabs tablet Commonly known as: JARDIANCE Take 25 mg by mouth daily.   gabapentin 400 MG capsule Commonly known as: NEURONTIN Take 1 capsule (400 mg total) by mouth 3 (three) times daily. What changed:   medication strength  how much to take  when to take this   gabapentin 100 MG capsule Commonly known as: NEURONTIN Take 2 capsules (200 mg total) by mouth at bedtime. What changed: You were  already taking a medication with the same name, and this prescription was added. Make sure you understand how and when to take each. Notes to patient: You are taking total of 600 mg at bedtime( 400 mg pill +two of the 100 mg pills)   hydrochlorothiazide 12.5 MG tablet Commonly known as: HYDRODIURIL Take 1 tablet (12.5 mg total) by mouth daily.   insulin detemir 100 UNIT/ML injection Commonly known as: LEVEMIR Inject 12-20 Units into the skin daily as needed (blood sugar above 150).   isosorbide mononitrate 30 MG 24 hr tablet Commonly known as: IMDUR Take 1 tablet (30 mg total) by mouth daily. Please schedule yearly appointment for future refills. Thank you   losartan 50 MG tablet Commonly known as: COZAAR Take 1 tablet (50 mg total) by mouth daily.   metFORMIN 1000 MG tablet Commonly known as: GLUCOPHAGE Take 1 tablet (1,000 mg total) by mouth 2 (two) times daily.   methocarbamol 500 MG tablet Commonly known as: ROBAXIN Take  1 tablet (500 mg total) by mouth every 6 (six) hours as needed for muscle spasms.   nitroGLYCERIN 0.4 MG SL tablet Commonly known as: NITROSTAT Place 0.4 mg under the tongue every 5 (five) minutes x 3 doses as needed for chest pain.   ONE TOUCH ULTRA TEST test strip Generic drug: glucose blood CHECK BLOOD SUGAR ONCE DAILY AS DIRECTED   oxyCODONE 5 MG immediate release tablet--Rx# 14 pills Commonly known as: Oxy IR/ROXICODONE Take 1 tablet (5 mg total) by mouth every 12 (twelve) hours as needed for breakthrough pain.   terazosin 5 MG capsule Commonly known as: HYTRIN Take 1 capsule (5 mg total) by mouth at bedtime.   traMADol 50 MG tablet--Rx# 28 pills Commonly known as: ULTRAM Take 1 tablet (50 mg total) by mouth every 6 (six) hours as needed for moderate pain.   traZODone 50 MG tablet Commonly known as: DESYREL Take 0.5-1 tablets (25-50 mg total) by mouth at bedtime as needed for sleep.   Vitamin D3 50 MCG (2000 UT) Tabs Take 2,000 Units by  mouth daily.       Follow-up Information    Raulkar, Clide Deutscher, MD Follow up.   Specialty: Physical Medicine and Rehabilitation Why: 01/15/21 please arrive at 9:00 for 9:20  Contact information: [address] [place] [address] [phone]        [name], MD. Call on 12/29/2020.   Specialty: Internal Medicine Why: for post hospital follow up Contact information: [place] [address] [phone]        Jettie Booze, MD Follow up.   Specialties: Cardiology, Radiology, Interventional Cardiology Contact information: [address] [place] [address] [phone]        [name], MD. Call on 12/29/2020.   Specialty: Orthopedic Surgery Why: for post op check Contact information: [place] [address] [phone]               Signed: Bary Leriche 12/26/2020, 7:30 PM

## 2020-12-27 DIAGNOSIS — I10 Essential (primary) hypertension: Secondary | ICD-10-CM

## 2020-12-27 DIAGNOSIS — Z794 Long term (current) use of insulin: Secondary | ICD-10-CM

## 2020-12-27 DIAGNOSIS — D62 Acute posthemorrhagic anemia: Secondary | ICD-10-CM

## 2020-12-27 LAB — GLUCOSE, CAPILLARY: Glucose-Capillary: 136 mg/dL — ABNORMAL HIGH (ref 70–99)

## 2020-12-27 NOTE — Progress Notes (Signed)
INPATIENT REHABILITATION DISCHARGE NOTE   Discharge instructions by: Reesa Chew PA on 3/25  Verbalized understanding: Yes  Skin care/Wound care: Incision to Rt BKA. Area is red/erethyma present. Some superficial necrosis noted to lateral edge. Area appears as described from [place] note on 3/22. Patient instructed on washing with soap and water before applying shrinker. Monitoring for any increasing redness or drainage and notifying MDs. Foam dressing applied to anterior sore.   Pain: Denies  IV's None  Tubes/Drains: N/A  Safety instructions: To wear limb guard with transfers  Patient belongings: Wheelchair and bags went with patient  Discharged to: Home with granddaughters. Patient going to see wife in hospital with family   Discharged via: Family to take patient home in car.  Notes: Discharged about 35.

## 2020-12-27 NOTE — Plan of Care (Signed)
  Problem: Consults Goal: RH LIMB LOSS PATIENT EDUCATION Description: Description: See Patient Education module for eduction specifics. Outcome: Completed/Met Goal: Nutrition Consult-if indicated Outcome: Completed/Met Goal: Diabetes Guidelines if Diabetic/Glucose > 140 Description: If diabetic or lab glucose is > 140 mg/dl - Initiate Diabetes/Hyperglycemia Guidelines & Document Interventions  Outcome: Completed/Met   Problem: RH BOWEL ELIMINATION Goal: RH STG MANAGE BOWEL WITH ASSISTANCE Description: STG Manage Bowel with Mod I Assistance. Outcome: Completed/Met Goal: RH STG MANAGE BOWEL W/MEDICATION W/ASSISTANCE Description: STG Manage Bowel with Medication with Mod I Assistance. Outcome: Completed/Met   Problem: RH SKIN INTEGRITY Goal: RH STG ABLE TO PERFORM INCISION/WOUND CARE W/ASSISTANCE Description: STG Able To Perform Incision/Wound Care With Mod I Assistance. Outcome: Completed/Met   Problem: RH SAFETY Goal: RH STG ADHERE TO SAFETY PRECAUTIONS W/ASSISTANCE/DEVICE Description: STG Adhere to Safety Precautions With cues and reminders. Outcome: Completed/Met   Problem: RH PAIN MANAGEMENT Goal: RH STG PAIN MANAGED AT OR BELOW PT'S PAIN GOAL Description: Pain less than 4 on 0-10 scale Outcome: Completed/Met   Problem: RH KNOWLEDGE DEFICIT LIMB LOSS Goal: RH STG INCREASE KNOWLEDGE OF SELF CARE AFTER LIMB LOSS Description: Patient will demonstrate proper dressing change techniques and be able to identify symptoms of surgical site infection with Mod I assist.   Patient will demonstrate understanding of medication regimen, dietary and lifestyle modifications with Mod I assist using resources provided.   Outcome: Completed/Met

## 2020-12-27 NOTE — Progress Notes (Signed)
PROGRESS NOTE   Subjective/Complaints: Patient seen sitting up at the edge of his bed this morning, but sitting balance noted.  He states he slept well overnight.  He is ready for discharge.  ROS: Denies CP, SOB, N/V/D  Objective:   No results found. No results for input(s): WBC, HGB, HCT, PLT in the last 72 hours. No results for input(s): NA, K, CL, CO2, GLUCOSE, BUN, CREATININE, CALCIUM in the last 72 hours.  Intake/Output Summary (Last 24 hours) at 12/27/2020 0936 Last data filed at 12/27/2020 0500 Gross per 24 hour  Intake 400 ml  Output 776 ml  Net -376 ml        Physical Exam: Vital Signs Blood pressure (!) 123/50, pulse 65, temperature 98.2 F (36.8 C), resp. rate 18, height 5\' 10"  (1.778 m), weight 66.8 kg, SpO2 99 %. Constitutional: No distress . Vital signs reviewed. HENT: Normocephalic.  Atraumatic. Eyes: EOMI. No discharge. Cardiovascular: No JVD.  RRR. Respiratory: Normal effort.  No stridor.  Bilateral clear to auscultation. GI: Non-distended.  BS +. Skin: Warm and dry.   Right BKA with erythema along incision and posterior flap as well as DTI on tibial tuberosity Psych: Normal mood.  Normal behavior. Musc: Right BKA with edema and tenderness Neuro: Alert UEs 5/5 in biceps, triceps, WE grip and finger abd B/L, unchanged LLE- 5/5 in HF, KE, DF and PF RLE- HF/KE/ 5/5  Assessment/Plan: 1. Functional deficits which require 3+ hours per day of interdisciplinary therapy in a comprehensive inpatient rehab setting.  Physiatrist is providing close team supervision and 24 hour management of active medical problems listed below.  Physiatrist and rehab team continue to assess barriers to discharge/monitor patient progress toward functional and medical goals  Care Tool:  Bathing    Body parts bathed by patient: Right arm,Left arm,Chest,Abdomen,Front perineal area,Right upper leg,Left upper  leg,Face,Buttocks,Left lower leg   Body parts bathed by helper: Buttocks,Left lower leg Body parts n/a: Right lower leg   Bathing assist Assist Level: Set up assist     Upper Body Dressing/Undressing Upper body dressing   What is the patient wearing?: Button up shirt    Upper body assist Assist Level: Independent    Lower Body Dressing/Undressing Lower body dressing      What is the patient wearing?: Underwear/pull up,Pants     Lower body assist Assist for lower body dressing: Independent     Toileting Toileting    Toileting assist Assist for toileting: Independent with assistive device Assistive Device Comment: urinal   Transfers Chair/bed transfer  Transfers assist     Chair/bed transfer assist level: Supervision/Verbal cueing     Locomotion Ambulation   Ambulation assist      Assist level: Contact Guard/Touching assist Assistive device: Walker-rolling Max distance: 76ft   Walk 10 feet activity   Assist     Assist level: Contact Guard/Touching assist Assistive device: Walker-rolling   Walk 50 feet activity   Assist Walk 50 feet with 2 turns activity did not occur: Safety/medical concerns  Assist level: Contact Guard/Touching assist Assistive device: Walker-rolling    Walk 150 feet activity   Assist Walk 150 feet activity did not occur: Safety/medical concerns (R  BKA, fatigue, weakness)         Walk 10 feet on uneven surface  activity   Assist Walk 10 feet on uneven surfaces activity did not occur: Safety/medical concerns (R BKA, fatigue, weakness)         Wheelchair     Assist Will patient use wheelchair at discharge?: Yes Type of Wheelchair: Manual    Wheelchair assist level: Independent Max wheelchair distance: >200    Wheelchair 50 feet with 2 turns activity    Assist        Assist Level: Independent   Wheelchair 150 feet activity     Assist      Assist Level: Independent   Blood pressure  (!) 123/50, pulse 65, temperature 98.2 F (36.8 C), resp. rate 18, height 5\' 10"  (1.778 m), weight 66.8 kg, SpO2 99 %.  Medical Problem List and Plan: 1.  R BKA secondary to foul smelling wound in R foot- with phantom pain and residual limb pain  DC today  Patient to follow-up with MD in 1 month post-discharge 2.  Impaired mobility: -DVT/anticoagulation:  Pharmaceutical: Continue Lovenox-              -antiplatelet therapy: ASA/Plavix 3. Pain Management:   gabapentin to tid to help with left foot neuropathic pain, increase to 600mg . Denies right limb phantom limb pain        -wean oxy to 5mg  q12H.   Controlled on 3/26 4. Mood: LCSW to follow for evaluation and support.              -antipsychotic agents: N/A 5. Neuropsych: This patient is capable of making decisions on his own behalf. 6. Skin/Wound Care: wound vac off  Monitor erythema along posterior flap and incision line as well as tibial tuberosity -discussed with patient, states this is how his stump and tibia tuberosity have appeared since surgery without any changes.  He states he is follow-up with Ortho on Monday.  -continue local wound care 7. Fluids/Electrolytes/Nutrition: Monitor I/Os. D/ced prosource 8. HTN: Monitor BP tid--on Norvasc, HCTZ and Cozaar.    Controlled on 3/26 9. AKI: Monitor renal status with serial checks.  Cr normalized, repeat outpatient.  10. T2DM: Hgb A1C-7.7. Monitor BS ac/hs and use SSI for elevated BS.             -Continue Jardiance, metformin (SCr borderline at 1.33)  Increase levemir to 17U, also on Novolog 3U with meals.   Elevated on 3/26, but?  Improving, continue to monitor during therapy setting with potential further adjustments as necessary 11. Hyponatremia with hypovolemia:    -continue to encourage appropriate fluids  Sodium 134 on 3/21  -recheck bmet outpatient 12. CAD s/p PTCA: Monitor for symptoms with increase in activity.             --On DAPT, Imdur, Cozaar and Jardiance.  13.  Constipation: d/c Miralax as refusing.  14. Hypokalemia: normalized, repeat outpatient.  15. Anemia: Hemoglobin 12.1 on 3/21   LOS: 11 days A FACE TO FACE EVALUATION WAS PERFORMED  Cadi Rhinehart Lorie Phenix 12/27/2020, 9:36 AM

## 2020-12-29 ENCOUNTER — Telehealth: Payer: Self-pay | Admitting: Radiology

## 2020-12-29 DIAGNOSIS — F341 Dysthymic disorder: Secondary | ICD-10-CM | POA: Diagnosis not present

## 2020-12-29 DIAGNOSIS — Z4781 Encounter for orthopedic aftercare following surgical amputation: Secondary | ICD-10-CM | POA: Diagnosis not present

## 2020-12-29 DIAGNOSIS — K5900 Constipation, unspecified: Secondary | ICD-10-CM | POA: Diagnosis not present

## 2020-12-29 DIAGNOSIS — Z794 Long term (current) use of insulin: Secondary | ICD-10-CM | POA: Diagnosis not present

## 2020-12-29 DIAGNOSIS — E876 Hypokalemia: Secondary | ICD-10-CM | POA: Diagnosis not present

## 2020-12-29 DIAGNOSIS — J309 Allergic rhinitis, unspecified: Secondary | ICD-10-CM | POA: Diagnosis not present

## 2020-12-29 DIAGNOSIS — E1140 Type 2 diabetes mellitus with diabetic neuropathy, unspecified: Secondary | ICD-10-CM | POA: Diagnosis not present

## 2020-12-29 DIAGNOSIS — D696 Thrombocytopenia, unspecified: Secondary | ICD-10-CM | POA: Diagnosis not present

## 2020-12-29 DIAGNOSIS — E1152 Type 2 diabetes mellitus with diabetic peripheral angiopathy with gangrene: Secondary | ICD-10-CM | POA: Diagnosis not present

## 2020-12-29 DIAGNOSIS — I2510 Atherosclerotic heart disease of native coronary artery without angina pectoris: Secondary | ICD-10-CM | POA: Diagnosis not present

## 2020-12-29 DIAGNOSIS — I10 Essential (primary) hypertension: Secondary | ICD-10-CM | POA: Diagnosis not present

## 2020-12-29 DIAGNOSIS — Z955 Presence of coronary angioplasty implant and graft: Secondary | ICD-10-CM | POA: Diagnosis not present

## 2020-12-29 DIAGNOSIS — N179 Acute kidney failure, unspecified: Secondary | ICD-10-CM | POA: Diagnosis not present

## 2020-12-29 DIAGNOSIS — M1990 Unspecified osteoarthritis, unspecified site: Secondary | ICD-10-CM | POA: Diagnosis not present

## 2020-12-29 DIAGNOSIS — E871 Hypo-osmolality and hyponatremia: Secondary | ICD-10-CM | POA: Diagnosis not present

## 2020-12-29 DIAGNOSIS — G8929 Other chronic pain: Secondary | ICD-10-CM | POA: Diagnosis not present

## 2020-12-29 DIAGNOSIS — M545 Low back pain, unspecified: Secondary | ICD-10-CM | POA: Diagnosis not present

## 2020-12-29 DIAGNOSIS — G546 Phantom limb syndrome with pain: Secondary | ICD-10-CM | POA: Diagnosis not present

## 2020-12-29 DIAGNOSIS — Z7984 Long term (current) use of oral hypoglycemic drugs: Secondary | ICD-10-CM | POA: Diagnosis not present

## 2020-12-29 DIAGNOSIS — E861 Hypovolemia: Secondary | ICD-10-CM | POA: Diagnosis not present

## 2020-12-29 DIAGNOSIS — E538 Deficiency of other specified B group vitamins: Secondary | ICD-10-CM | POA: Diagnosis not present

## 2020-12-29 DIAGNOSIS — D649 Anemia, unspecified: Secondary | ICD-10-CM | POA: Diagnosis not present

## 2020-12-29 DIAGNOSIS — M109 Gout, unspecified: Secondary | ICD-10-CM | POA: Diagnosis not present

## 2020-12-29 DIAGNOSIS — Z89511 Acquired absence of right leg below knee: Secondary | ICD-10-CM | POA: Diagnosis not present

## 2020-12-29 NOTE — Telephone Encounter (Signed)
Otila Kluver called states that she admitted pt in to [facility] today, states that when she took off shrinker he had 3 quarter sized blisters on stump and a small area near the incision, she states that blister look yellow and they are draining but it is clear.  Otila Kluver would like a call back to discuss this. [phone]

## 2020-12-29 NOTE — Telephone Encounter (Signed)
Called pt and made and appt foe eval in office tomorrow afternoon. Will hold this message so I can discuss with HHN. She advised pt not to use the shrinker and applied a non- adherent dressing. Will call for updated orders after visit.

## 2020-12-30 ENCOUNTER — Encounter: Payer: Self-pay | Admitting: Orthopedic Surgery

## 2020-12-30 ENCOUNTER — Ambulatory Visit (INDEPENDENT_AMBULATORY_CARE_PROVIDER_SITE_OTHER): Payer: Medicare Other | Admitting: Orthopedic Surgery

## 2020-12-30 DIAGNOSIS — Z89511 Acquired absence of right leg below knee: Secondary | ICD-10-CM

## 2020-12-30 DIAGNOSIS — Z4781 Encounter for orthopedic aftercare following surgical amputation: Secondary | ICD-10-CM | POA: Diagnosis not present

## 2020-12-30 DIAGNOSIS — E1152 Type 2 diabetes mellitus with diabetic peripheral angiopathy with gangrene: Secondary | ICD-10-CM | POA: Diagnosis not present

## 2020-12-30 DIAGNOSIS — E1140 Type 2 diabetes mellitus with diabetic neuropathy, unspecified: Secondary | ICD-10-CM | POA: Diagnosis not present

## 2020-12-30 DIAGNOSIS — G546 Phantom limb syndrome with pain: Secondary | ICD-10-CM | POA: Diagnosis not present

## 2020-12-30 DIAGNOSIS — I2510 Atherosclerotic heart disease of native coronary artery without angina pectoris: Secondary | ICD-10-CM | POA: Diagnosis not present

## 2020-12-30 DIAGNOSIS — G8929 Other chronic pain: Secondary | ICD-10-CM | POA: Diagnosis not present

## 2020-12-30 MED ORDER — DOXYCYCLINE HYCLATE 100 MG PO TABS: 100.0000 mg | ORAL_TABLET | Freq: Two times a day (BID) | ORAL | 0 refills | Status: DC

## 2020-12-30 NOTE — Telephone Encounter (Signed)
I called and lm on vm for HHN to advise that I called the pt yesterday and made him an appt to come in for eval with Dr. Sharol Given today. I advised that the pt is to wash the limb with dial soap and water and to apply his shrinker with direct contact to the skin and change this daily. The wool is impregnated with a nano silver and that is good for wound healing and any tissues that are removed with the shrinker is nonviable tissue and this is debriding the wound as well as providing the needed compression. Advised that these instructions were given to the pt today and that if there are any questions or if I can be of help in any way to call and let me know.

## 2020-12-30 NOTE — Progress Notes (Signed)
Office Visit Note   Patient: Keith Espinoza           Date of Birth: Aug 15, 1941           MRN: [id number] Visit Date: 12/30/2020              Requested by: [name], MD [address] [place],  [place] [address] PCP: [name], MD  Chief Complaint  Patient presents with  . Right Knee - Follow-up      HPI: Patient is a 80 year old gentleman who is 2 weeks status post right below the knee amputation also status post revascularization.  States he has had some swelling he has completed a 7-day course of doxycycline from rehab.  Assessment & Plan: Visit Diagnoses:  1. Right below-knee amputee [facility])     Plan: A new prescription for doxycycline is called and he is to wear the extra-large stump shrinker around-the-clock his limb protector was cut out to take pressure off the tibial tubercle.  Follow-Up Instructions: Return in about 1 week (around 01/06/2021).   Ortho Exam  Patient is alert, oriented, no adenopathy, well-dressed, normal affect, normal respiratory effort. Examination patient has swelling with clear weeping edema there is some mild superficial ischemic changes along the surgical incision the edges are well approximated there is some mild cellulitis around the surgical incision.  Imaging: No results found. No images are attached to the encounter.  Labs: Lab Results  Component Value Date   HGBA1C 7.7 (H) 12/12/2020   HGBA1C 6.6 (H) 04/10/2019   HGBA1C 7.0 (H) 07/14/2016   REPTSTATUS 04/18/2019 FINAL 04/13/2019   GRAMSTAIN  04/13/2019    RARE WBC PRESENT, PREDOMINANTLY PMN NO ORGANISMS SEEN    CULT  04/13/2019    No growth aerobically or anaerobically. Performed at [facility], [place] [address] [place], [place] [address]      Lab Results  Component Value Date   ALBUMIN 2.9 (L) 12/17/2020   ALBUMIN 4.4 04/10/2019   ALBUMIN 4.2 07/14/2016    No results found for: MG No results found for: VD25OH  No results found for:  PREALBUMIN CBC EXTENDED Latest Ref Rng & Units 12/22/2020 12/17/2020 12/12/2020  WBC 4.0 - 10.5 K/uL 8.5 8.6 10.4  RBC 4.22 - 5.81 MIL/uL 3.98(L) 3.65(L) 4.31  HGB 13.0 - 17.0 g/dL 12.1(L) 11.4(L) 13.4  HCT 39.0 - 52.0 % 34.3(L) 31.7(L) 39.1  PLT 150 - 400 K/uL 256 179 190  NEUTROABS 1.7 - 7.7 K/uL - 6.1 -  LYMPHSABS 0.7 - 4.0 K/uL - 1.3 -     There is no height or weight on file to calculate BMI.  Orders:  No orders of the defined types were placed in this encounter.  Meds ordered this encounter  Medications  . doxycycline (VIBRA-TABS) 100 MG tablet    Sig: Take 1 tablet (100 mg total) by mouth every 12 (twelve) hours.    Dispense:  40 tablet    Refill:  0     Procedures: No procedures performed  Clinical Data: No additional findings.  ROS:  All other systems negative, except as noted in the HPI. Review of Systems  Objective: Vital Signs: There were no vitals taken for this visit.  Specialty Comments:  No specialty comments available.  PMFS History: Patient Active Problem List   Diagnosis Date Noted  . Protein-calorie malnutrition, severe 12/26/2020  . Acute blood loss anemia 12/26/2020  . Prerenal azotemia 12/26/2020  . Below-knee amputation of right lower  extremity ([place]) 12/12/2020  . Gangrene of right foot ([place])   . Diabetic neuropathy ([place]) 11/17/2020  . Hyperglycemia due to type 2 diabetes mellitus ([place]) 11/17/2020  . Long term (current) use of insulin ([place]) 11/17/2020  . Obesity 11/17/2020  . 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/2019  . Hip dislocation, right ([place]) 04/13/2019  . Other intervertebral disc degeneration, lumbar region 03/30/2019  . CAD (coronary artery disease) 01/30/2019  . Tobacco abuse 01/30/2019  . 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  . Burn, foot, second degree, left, initial encounter 06/08/2017  . 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 S/P total knee arthroplasty, left 10/26/2016  . Hyperlipidemia  09/04/2014  . Thrombocytopenia ([place])   . Precordial chest pain 04/05/2014  . Coronary atherosclerosis of native coronary artery 10/01/2013  . Other and unspecified hyperlipidemia 10/01/2013  . Essential hypertension, benign 10/01/2013  . Insulin dependent type 2 diabetes mellitus ([place]) 10/01/2013  . Esophageal reflux 10/01/2013  . Hypertrophy of prostate without urinary obstruction and other lower urinary tract symptoms (LUTS) 10/01/2013   Past Medical History:  Diagnosis Date  . Allergic rhinitis   . Allergic rhinitis   . Arthritis   . Basal cell carcinoma 11/01/2019    bcc left chest treatment [place] cx3 60fu   . Chronic leg pain    right  . Chronic lower back pain   . Coronary artery disease    a. Stenting to RCA 2004; staged DES to LAD and Cx 2004. DES to mRCA 2012. b. DES to mCx, PTCA to dCx 11/2011. c. Lateral wall MI 2013 s/p PTCA to distal Cx & DES to mid OM2 11/2011. d. Low risk nuc 04/2014, EF wnl.  . Diabetes mellitus    Insulin dependent  . Diabetic neuropathy (HCC)    MILD  . Diverticulosis   . Dysrhythmia   . Gilbert syndrome   . Gout    right wrist; right foot; right elbow; have had it since 1970's  . H/O hiatal hernia   . Heart murmur   . History of echocardiogram    aortic sclerosis per echo 12/09 EF 65%, otherwise normal  . History of hemorrhoids    BLEEDING  . History of kidney stones    h/o  . Hypertension    Diagnosed 1995   . Myocardial infarction ([place])   . Pancreatic pseudocyst    a. s/p remote drainage 2006.  Marland Kitchen Thrombocytopenia ([place])    Seen on oldest labs in system from 2004  . Vitamin B 12 deficiency    orally replaced    Family History  Problem Relation Age of Onset  . Diabetes Mother   . Hyperlipidemia Mother   . Hypertension Mother   . Cancer Father   . Hypertension Father   . Diabetes Sister   . Hypertension Sister   . Cancer Brother   . Heart attack Neg Hx     Past Surgical History:  Procedure Laterality Date  . ABDOMINAL AORTOGRAM  W/LOWER EXTREMITY Bilateral 08/08/2020   Procedure: ABDOMINAL AORTOGRAM W/LOWER EXTREMITY;  Surgeon: Angelia Mould, MD;  Location: [place] [facility];  Service: Cardiovascular;  Laterality: Bilateral;  . AMPUTATION Right 12/12/2020   Procedure: RIGHT BELOW KNEE AMPUTATION;  Surgeon: [name], MD;  Location: [place];  Service: Orthopedics;  Laterality: Right;  . BACK SURGERY     "total of 3 times" S/P fall   . CARPAL TUNNEL RELEASE Bilateral   . CHOLECYSTECTOMY  1990's  . COLONOSCOPY    .  CORONARY ANGIOPLASTY  11/11/11  . CORONARY ANGIOPLASTY WITH STENT PLACEMENT  09/30/2011   "1 then; makes a total of 4"  . CORONARY ANGIOPLASTY WITH STENT PLACEMENT  11/11/11   "1; makes a total of 5"  . INGUINAL HERNIA REPAIR  2003   right  . JOINT REPLACEMENT Right 04/03/2002   hip replacment  . KNEE ARTHROSCOPY  1990's   left  . LEFT HEART CATHETERIZATION WITH CORONARY ANGIOGRAM N/A 09/30/2011   Procedure: LEFT HEART CATHETERIZATION WITH CORONARY ANGIOGRAM;  Surgeon: Jettie Booze, MD;  Location: [facility] CATH LAB;  Service: Cardiovascular;  Laterality: N/A;  possible PCI  . LEFT HEART CATHETERIZATION WITH CORONARY ANGIOGRAM N/A 11/15/2011   Procedure: LEFT HEART CATHETERIZATION WITH CORONARY ANGIOGRAM;  Surgeon: Jettie Booze, MD;  Location: [facility] CATH LAB;  Service: Cardiovascular;  Laterality: N/A;  . PERCUTANEOUS CORONARY STENT INTERVENTION (PCI-S)  09/30/2011   Procedure: PERCUTANEOUS CORONARY STENT INTERVENTION (PCI-S);  Surgeon: Jettie Booze, MD;  Location: [facility] CATH LAB;  Service: Cardiovascular;;  . PERCUTANEOUS CORONARY STENT INTERVENTION (PCI-S) N/A 11/11/2011   Procedure: PERCUTANEOUS CORONARY STENT INTERVENTION (PCI-S);  Surgeon: Jettie Booze, MD;  Location: [facility] CATH LAB;  Service: Cardiovascular;  Laterality: N/A;  . PERIPHERAL VASCULAR BALLOON ANGIOPLASTY Right 08/08/2020   Procedure: PERIPHERAL VASCULAR BALLOON ANGIOPLASTY;  Surgeon: Angelia Mould, MD;  Location:  [place] [facility];  Service: Cardiovascular;  Laterality: Right;  Posterior tibial   . SHOULDER SURGERY Right    X 2  . TONSILLECTOMY  ~ 1948  . TOTAL HIP REVISION Right 04/13/2019   Procedure: RIGHT TOTAL HIP REVISION-POSTERIOR  APPROACH LATERAL;  Surgeon: Marybelle Killings, MD;  Location: [place];  Service: Orthopedics;  Laterality: Right;  . TOTAL KNEE ARTHROPLASTY Left 07/23/2016   Procedure: LEFT TOTAL KNEE ARTHROPLASTY;  Surgeon: Marybelle Killings, MD;  Location: [place];  Service: Orthopedics;  Laterality: Left;   Social History   Occupational History  . Occupation: Retired  Tobacco Use  . Smoking status: Former Research scientist (life sciences)  . Smokeless tobacco: Current User    Types: Chew  . Tobacco comment: quit 60 years ago  Vaping Use  . Vaping Use: Never used  Substance and Sexual Activity  . Alcohol use: No  . Drug use: No  . Sexual activity: Not Currently

## 2020-12-31 DIAGNOSIS — G546 Phantom limb syndrome with pain: Secondary | ICD-10-CM | POA: Diagnosis not present

## 2020-12-31 DIAGNOSIS — E1140 Type 2 diabetes mellitus with diabetic neuropathy, unspecified: Secondary | ICD-10-CM | POA: Diagnosis not present

## 2020-12-31 DIAGNOSIS — I2510 Atherosclerotic heart disease of native coronary artery without angina pectoris: Secondary | ICD-10-CM | POA: Diagnosis not present

## 2020-12-31 DIAGNOSIS — E1152 Type 2 diabetes mellitus with diabetic peripheral angiopathy with gangrene: Secondary | ICD-10-CM | POA: Diagnosis not present

## 2020-12-31 DIAGNOSIS — G8929 Other chronic pain: Secondary | ICD-10-CM | POA: Diagnosis not present

## 2020-12-31 DIAGNOSIS — Z4781 Encounter for orthopedic aftercare following surgical amputation: Secondary | ICD-10-CM | POA: Diagnosis not present

## 2021-01-01 DIAGNOSIS — G8929 Other chronic pain: Secondary | ICD-10-CM | POA: Diagnosis not present

## 2021-01-01 DIAGNOSIS — I25118 Atherosclerotic heart disease of native coronary artery with other forms of angina pectoris: Secondary | ICD-10-CM | POA: Diagnosis not present

## 2021-01-01 DIAGNOSIS — E1151 Type 2 diabetes mellitus with diabetic peripheral angiopathy without gangrene: Secondary | ICD-10-CM | POA: Diagnosis not present

## 2021-01-01 DIAGNOSIS — G546 Phantom limb syndrome with pain: Secondary | ICD-10-CM | POA: Diagnosis not present

## 2021-01-01 DIAGNOSIS — E785 Hyperlipidemia, unspecified: Secondary | ICD-10-CM | POA: Diagnosis not present

## 2021-01-01 DIAGNOSIS — E1342 Other specified diabetes mellitus with diabetic polyneuropathy: Secondary | ICD-10-CM | POA: Diagnosis not present

## 2021-01-01 DIAGNOSIS — I2510 Atherosclerotic heart disease of native coronary artery without angina pectoris: Secondary | ICD-10-CM | POA: Diagnosis not present

## 2021-01-01 DIAGNOSIS — K219 Gastro-esophageal reflux disease without esophagitis: Secondary | ICD-10-CM | POA: Diagnosis not present

## 2021-01-01 DIAGNOSIS — E1152 Type 2 diabetes mellitus with diabetic peripheral angiopathy with gangrene: Secondary | ICD-10-CM | POA: Diagnosis not present

## 2021-01-01 DIAGNOSIS — Z4781 Encounter for orthopedic aftercare following surgical amputation: Secondary | ICD-10-CM | POA: Diagnosis not present

## 2021-01-01 DIAGNOSIS — E1165 Type 2 diabetes mellitus with hyperglycemia: Secondary | ICD-10-CM | POA: Diagnosis not present

## 2021-01-01 DIAGNOSIS — E1140 Type 2 diabetes mellitus with diabetic neuropathy, unspecified: Secondary | ICD-10-CM | POA: Diagnosis not present

## 2021-01-01 DIAGNOSIS — I10 Essential (primary) hypertension: Secondary | ICD-10-CM | POA: Diagnosis not present

## 2021-01-01 DIAGNOSIS — N400 Enlarged prostate without lower urinary tract symptoms: Secondary | ICD-10-CM | POA: Diagnosis not present

## 2021-01-02 DIAGNOSIS — G8929 Other chronic pain: Secondary | ICD-10-CM | POA: Diagnosis not present

## 2021-01-02 DIAGNOSIS — Z4781 Encounter for orthopedic aftercare following surgical amputation: Secondary | ICD-10-CM | POA: Diagnosis not present

## 2021-01-02 DIAGNOSIS — E1152 Type 2 diabetes mellitus with diabetic peripheral angiopathy with gangrene: Secondary | ICD-10-CM | POA: Diagnosis not present

## 2021-01-02 DIAGNOSIS — G546 Phantom limb syndrome with pain: Secondary | ICD-10-CM | POA: Diagnosis not present

## 2021-01-02 DIAGNOSIS — E1140 Type 2 diabetes mellitus with diabetic neuropathy, unspecified: Secondary | ICD-10-CM | POA: Diagnosis not present

## 2021-01-02 DIAGNOSIS — I2510 Atherosclerotic heart disease of native coronary artery without angina pectoris: Secondary | ICD-10-CM | POA: Diagnosis not present

## 2021-01-05 DIAGNOSIS — G546 Phantom limb syndrome with pain: Secondary | ICD-10-CM | POA: Diagnosis not present

## 2021-01-05 DIAGNOSIS — E1152 Type 2 diabetes mellitus with diabetic peripheral angiopathy with gangrene: Secondary | ICD-10-CM | POA: Diagnosis not present

## 2021-01-05 DIAGNOSIS — Z4781 Encounter for orthopedic aftercare following surgical amputation: Secondary | ICD-10-CM | POA: Diagnosis not present

## 2021-01-05 DIAGNOSIS — E1140 Type 2 diabetes mellitus with diabetic neuropathy, unspecified: Secondary | ICD-10-CM | POA: Diagnosis not present

## 2021-01-05 DIAGNOSIS — I2510 Atherosclerotic heart disease of native coronary artery without angina pectoris: Secondary | ICD-10-CM | POA: Diagnosis not present

## 2021-01-05 DIAGNOSIS — G8929 Other chronic pain: Secondary | ICD-10-CM | POA: Diagnosis not present

## 2021-01-06 ENCOUNTER — Other Ambulatory Visit: Payer: Self-pay

## 2021-01-06 DIAGNOSIS — E559 Vitamin D deficiency, unspecified: Secondary | ICD-10-CM | POA: Insufficient documentation

## 2021-01-06 DIAGNOSIS — M5430 Sciatica, unspecified side: Secondary | ICD-10-CM | POA: Insufficient documentation

## 2021-01-06 DIAGNOSIS — I70299 Other atherosclerosis of native arteries of extremities, unspecified extremity: Secondary | ICD-10-CM

## 2021-01-06 DIAGNOSIS — M109 Gout, unspecified: Secondary | ICD-10-CM | POA: Insufficient documentation

## 2021-01-06 DIAGNOSIS — N400 Enlarged prostate without lower urinary tract symptoms: Secondary | ICD-10-CM | POA: Insufficient documentation

## 2021-01-06 DIAGNOSIS — R519 Headache, unspecified: Secondary | ICD-10-CM | POA: Insufficient documentation

## 2021-01-06 DIAGNOSIS — E162 Hypoglycemia, unspecified: Secondary | ICD-10-CM | POA: Insufficient documentation

## 2021-01-06 DIAGNOSIS — G629 Polyneuropathy, unspecified: Secondary | ICD-10-CM | POA: Insufficient documentation

## 2021-01-06 DIAGNOSIS — Z9861 Coronary angioplasty status: Secondary | ICD-10-CM | POA: Insufficient documentation

## 2021-01-06 DIAGNOSIS — D81819 Biotin-dependent carboxylase deficiency, unspecified: Secondary | ICD-10-CM | POA: Insufficient documentation

## 2021-01-06 DIAGNOSIS — E538 Deficiency of other specified B group vitamins: Secondary | ICD-10-CM | POA: Insufficient documentation

## 2021-01-06 DIAGNOSIS — IMO0002 Reserved for concepts with insufficient information to code with codable children: Secondary | ICD-10-CM | POA: Insufficient documentation

## 2021-01-06 DIAGNOSIS — L97909 Non-pressure chronic ulcer of unspecified part of unspecified lower leg with unspecified severity: Secondary | ICD-10-CM

## 2021-01-06 DIAGNOSIS — R531 Weakness: Secondary | ICD-10-CM | POA: Insufficient documentation

## 2021-01-06 DIAGNOSIS — E1149 Type 2 diabetes mellitus with other diabetic neurological complication: Secondary | ICD-10-CM | POA: Insufficient documentation

## 2021-01-06 DIAGNOSIS — R630 Anorexia: Secondary | ICD-10-CM | POA: Insufficient documentation

## 2021-01-06 DIAGNOSIS — E1151 Type 2 diabetes mellitus with diabetic peripheral angiopathy without gangrene: Secondary | ICD-10-CM | POA: Insufficient documentation

## 2021-01-06 DIAGNOSIS — L97519 Non-pressure chronic ulcer of other part of right foot with unspecified severity: Secondary | ICD-10-CM | POA: Insufficient documentation

## 2021-01-06 DIAGNOSIS — E1165 Type 2 diabetes mellitus with hyperglycemia: Secondary | ICD-10-CM | POA: Insufficient documentation

## 2021-01-06 DIAGNOSIS — M542 Cervicalgia: Secondary | ICD-10-CM | POA: Insufficient documentation

## 2021-01-06 DIAGNOSIS — K859 Acute pancreatitis without necrosis or infection, unspecified: Secondary | ICD-10-CM | POA: Insufficient documentation

## 2021-01-06 DIAGNOSIS — Z1389 Encounter for screening for other disorder: Secondary | ICD-10-CM | POA: Insufficient documentation

## 2021-01-06 HISTORY — DX: Coronary angioplasty status: Z98.61

## 2021-01-08 ENCOUNTER — Encounter: Payer: Self-pay | Admitting: Orthopedic Surgery

## 2021-01-08 ENCOUNTER — Other Ambulatory Visit (HOSPITAL_COMMUNITY)
Admission: RE | Admit: 2021-01-08 | Discharge: 2021-01-08 | Disposition: A | Discharge status: Home / Self Care | Payer: Medicare Other | Source: Ambulatory Visit | Attending: Orthopedic Surgery | Admitting: Orthopedic Surgery

## 2021-01-08 ENCOUNTER — Ambulatory Visit (INDEPENDENT_AMBULATORY_CARE_PROVIDER_SITE_OTHER): Payer: Medicare Other | Admitting: Orthopedic Surgery

## 2021-01-08 ENCOUNTER — Encounter (HOSPITAL_COMMUNITY): Payer: Self-pay | Admitting: Orthopedic Surgery

## 2021-01-08 ENCOUNTER — Other Ambulatory Visit: Payer: Self-pay | Admitting: Physician Assistant

## 2021-01-08 DIAGNOSIS — G8929 Other chronic pain: Secondary | ICD-10-CM | POA: Diagnosis not present

## 2021-01-08 DIAGNOSIS — E1140 Type 2 diabetes mellitus with diabetic neuropathy, unspecified: Secondary | ICD-10-CM | POA: Diagnosis not present

## 2021-01-08 DIAGNOSIS — E1152 Type 2 diabetes mellitus with diabetic peripheral angiopathy with gangrene: Secondary | ICD-10-CM | POA: Diagnosis not present

## 2021-01-08 DIAGNOSIS — Z01812 Encounter for preprocedural laboratory examination: Secondary | ICD-10-CM | POA: Insufficient documentation

## 2021-01-08 DIAGNOSIS — T8781 Dehiscence of amputation stump: Secondary | ICD-10-CM

## 2021-01-08 DIAGNOSIS — Z20822 Contact with and (suspected) exposure to covid-19: Secondary | ICD-10-CM | POA: Insufficient documentation

## 2021-01-08 DIAGNOSIS — I2510 Atherosclerotic heart disease of native coronary artery without angina pectoris: Secondary | ICD-10-CM | POA: Diagnosis not present

## 2021-01-08 DIAGNOSIS — Z4781 Encounter for orthopedic aftercare following surgical amputation: Secondary | ICD-10-CM | POA: Diagnosis not present

## 2021-01-08 DIAGNOSIS — G546 Phantom limb syndrome with pain: Secondary | ICD-10-CM | POA: Diagnosis not present

## 2021-01-08 LAB — SARS CORONAVIRUS 2 (TAT 6-24 HRS): SARS Coronavirus 2: NEGATIVE

## 2021-01-08 NOTE — Progress Notes (Signed)
Office Visit Note   Patient: Keith Espinoza           Date of Birth: 1941-07-13           MRN: [id number] Visit Date: 01/08/2021              Requested by: [name], MD [address] [place] [place],  [place] [address] PCP: [name], MD  Chief Complaint  Patient presents with  . Right Knee - Routine Post Op      HPI: Patient is an 80 year old gentleman who is 4 weeks status post right transtibial amputation.  Patient is currently on doxycycline wearing a stump shrinker and a limb protector.  Patient notes acute purulent drainage and acute pain had difficulty sleeping last night.  Assessment & Plan: Visit Diagnoses:  1. Dehiscence of amputation stump ([place])     Plan: We will plan for revision of the below-knee amputation tomorrow plan for 3-day hospitalization.  Discussed that with the acute purulence and pain and ischemic changes despite oral antibiotics we need to proceed with a surgical debridement of the infected soft tissue with a revision of the amputation.  Follow-Up Instructions: Return in about 1 week (around 01/15/2021).   Ortho Exam  Patient is alert, oriented, no adenopathy, well-dressed, normal affect, normal respiratory effort. Examination patient has cellulitis of the medial aspect of the below the knee amputation.  There is a black eschar and there is now purulent drainage from the residual limb medially.  Imaging: No results found.     Labs: Lab Results  Component Value Date   HGBA1C 7.7 (H) 12/12/2020   HGBA1C 6.6 (H) 04/10/2019   HGBA1C 7.0 (H) 07/14/2016   REPTSTATUS 04/18/2019 FINAL 04/13/2019   GRAMSTAIN  04/13/2019    RARE WBC PRESENT, PREDOMINANTLY PMN NO ORGANISMS SEEN    CULT  04/13/2019    No growth aerobically or anaerobically. Performed at [facility], [place] [address] [place], [place] [address]      Lab Results  Component Value Date   ALBUMIN 2.9 (L) 12/17/2020   ALBUMIN 4.4 04/10/2019   ALBUMIN 4.2  07/14/2016    No results found for: MG No results found for: VD25OH  No results found for: PREALBUMIN CBC EXTENDED Latest Ref Rng & Units 12/22/2020 12/17/2020 12/12/2020  WBC 4.0 - 10.5 K/uL 8.5 8.6 10.4  RBC 4.22 - 5.81 MIL/uL 3.98(L) 3.65(L) 4.31  HGB 13.0 - 17.0 g/dL 12.1(L) 11.4(L) 13.4  HCT 39.0 - 52.0 % 34.3(L) 31.7(L) 39.1  PLT 150 - 400 K/uL 256 179 190  NEUTROABS 1.7 - 7.7 K/uL - 6.1 -  LYMPHSABS 0.7 - 4.0 K/uL - 1.3 -     There is no height or weight on file to calculate BMI.  Orders:  No orders of the defined types were placed in this encounter.  No orders of the defined types were placed in this encounter.    Procedures: No procedures performed  Clinical Data: No additional findings.  ROS:  All other systems negative, except as noted in the HPI. Review of Systems  Objective: Vital Signs: There were no vitals taken for this visit.  Specialty Comments:  No specialty comments available.  PMFS History: Patient Active Problem List   Diagnosis Date Noted  . Status post percutaneous transluminal coronary angioplasty 01/06/2021  . Type II diabetes mellitus, uncontrolled ([place]) 01/06/2021  . Acute pancreatitis 01/06/2021  . Diabetic peripheral vascular disease ([place]) 01/06/2021  . Encounter for screening for  other disorder 01/06/2021  . Enlarged prostate 01/06/2021  . Foot ulcer, right ([place]) 01/06/2021  . Gout 01/06/2021  . Headache 01/06/2021  . Neck pain 01/06/2021  . Hypoglycemia 01/06/2021  . Loss of appetite 01/06/2021  . 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 Peripheral neuropathy 01/06/2021  . Sciatica 01/06/2021  . Vitamin B12 deficiency 01/06/2021  . Vitamin D deficiency 01/06/2021  . Weakness 01/06/2021  . Diabetes mellitus type 2 with neurological manifestations ([place]) 01/06/2021  . Protein-calorie malnutrition, severe 12/26/2020  . Acute blood loss anemia 12/26/2020  . Prerenal azotemia 12/26/2020  . Below-knee amputation of right  lower extremity ([place]) 12/12/2020  . Gangrene of right foot ([place])   . Diabetic neuropathy ([place]) 11/17/2020  . Hyperglycemia due to type 2 diabetes mellitus ([place]) 11/17/2020  . Long term (current) use of insulin ([place]) 11/17/2020  . Obesity 11/17/2020  . 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/2019  . Hip dislocation, right ([place]) 04/13/2019  . Other intervertebral disc degeneration, lumbar region 03/30/2019  . CAD (coronary artery disease) 01/30/2019  . Tobacco abuse 01/30/2019  . 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  . Burn, foot, second degree, left, initial encounter 06/08/2017  . 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 S/P total knee arthroplasty, left 10/26/2016  . Hyperlipidemia 09/04/2014  . Thrombocytopenia ([place])   . Precordial chest pain 04/05/2014  . Coronary atherosclerosis of native coronary artery 10/01/2013  . Other and unspecified hyperlipidemia 10/01/2013  . Essential hypertension, benign 10/01/2013  . Insulin dependent type 2 diabetes mellitus ([place]) 10/01/2013  . Esophageal reflux 10/01/2013  . Hypertrophy of prostate without urinary obstruction and other lower urinary tract symptoms (LUTS) 10/01/2013   Past Medical History:  Diagnosis Date  . Allergic rhinitis   . Allergic rhinitis   . Arthritis   . Basal cell carcinoma 11/01/2019    bcc left chest treatment TX cx3 31fu   . Chronic leg pain    right  . Chronic lower back pain   . Coronary artery disease    a. Stenting to RCA 2004; staged DES to LAD and Cx 2004. DES to mRCA 2012. b. DES to mCx, PTCA to dCx 11/2011. c. Lateral wall MI 2013 s/p PTCA to distal Cx & DES to mid OM2 11/2011. d. Low risk nuc 04/2014, EF wnl.  . Diabetes mellitus    Insulin dependent  . Diabetic neuropathy (HCC)    MILD  . Diverticulosis   . Dysrhythmia   . Gilbert syndrome   . Gout    right wrist; right foot; right elbow; have had it since 1970's  . H/O hiatal hernia   . Heart murmur   . History of  echocardiogram    aortic sclerosis per echo 12/09 EF 65%, otherwise normal  . History of hemorrhoids    BLEEDING  . History of kidney stones    h/o  . Hypertension    Diagnosed 1995   . Myocardial infarction ([place])   . Pancreatic pseudocyst    a. s/p remote drainage 2006.  Marland Kitchen Thrombocytopenia ([place])    Seen on oldest labs in system from 2004  . Vitamin B 12 deficiency    orally replaced    Family History  Problem Relation Age of Onset  . Diabetes Mother   . Hyperlipidemia Mother   . Hypertension Mother   . Cancer Father   . Hypertension Father   . Diabetes Sister   . Hypertension Sister   . Cancer Brother   . Heart  attack Neg Hx     Past Surgical History:  Procedure Laterality Date  . ABDOMINAL AORTOGRAM W/LOWER EXTREMITY Bilateral 08/08/2020   Procedure: ABDOMINAL AORTOGRAM W/LOWER EXTREMITY;  Surgeon: Angelia Mould, MD;  Location: [place] [facility];  Service: Cardiovascular;  Laterality: Bilateral;  . AMPUTATION Right 12/12/2020   Procedure: RIGHT BELOW KNEE AMPUTATION;  Surgeon: [name], MD;  Location: [place];  Service: Orthopedics;  Laterality: Right;  . BACK SURGERY     "total of 3 times" S/P fall   . CARPAL TUNNEL RELEASE Bilateral   . CHOLECYSTECTOMY  1990's  . COLONOSCOPY    . CORONARY ANGIOPLASTY  11/11/11  . CORONARY ANGIOPLASTY WITH STENT PLACEMENT  09/30/2011   "1 then; makes a total of 4"  . CORONARY ANGIOPLASTY WITH STENT PLACEMENT  11/11/11   "1; makes a total of 5"  . INGUINAL HERNIA REPAIR  2003   right  . JOINT REPLACEMENT Right 04/03/2002   hip replacment  . KNEE ARTHROSCOPY  1990's   left  . LEFT HEART CATHETERIZATION WITH CORONARY ANGIOGRAM N/A 09/30/2011   Procedure: LEFT HEART CATHETERIZATION WITH CORONARY ANGIOGRAM;  Surgeon: Jettie Booze, MD;  Location: [facility] CATH LAB;  Service: Cardiovascular;  Laterality: N/A;  possible PCI  . LEFT HEART CATHETERIZATION WITH CORONARY ANGIOGRAM N/A 11/15/2011   Procedure: LEFT HEART  CATHETERIZATION WITH CORONARY ANGIOGRAM;  Surgeon: Jettie Booze, MD;  Location: [facility] CATH LAB;  Service: Cardiovascular;  Laterality: N/A;  . PERCUTANEOUS CORONARY STENT INTERVENTION (PCI-S)  09/30/2011   Procedure: PERCUTANEOUS CORONARY STENT INTERVENTION (PCI-S);  Surgeon: Jettie Booze, MD;  Location: [facility] CATH LAB;  Service: Cardiovascular;;  . PERCUTANEOUS CORONARY STENT INTERVENTION (PCI-S) N/A 11/11/2011   Procedure: PERCUTANEOUS CORONARY STENT INTERVENTION (PCI-S);  Surgeon: Jettie Booze, MD;  Location: [facility] CATH LAB;  Service: Cardiovascular;  Laterality: N/A;  . PERIPHERAL VASCULAR BALLOON ANGIOPLASTY Right 08/08/2020   Procedure: PERIPHERAL VASCULAR BALLOON ANGIOPLASTY;  Surgeon: Angelia Mould, MD;  Location: [place] [facility];  Service: Cardiovascular;  Laterality: Right;  Posterior tibial   . SHOULDER SURGERY Right    X 2  . TONSILLECTOMY  ~ 1948  . TOTAL HIP REVISION Right 04/13/2019   Procedure: RIGHT TOTAL HIP REVISION-POSTERIOR  APPROACH LATERAL;  Surgeon: Marybelle Killings, MD;  Location: [place];  Service: Orthopedics;  Laterality: Right;  . TOTAL KNEE ARTHROPLASTY Left 07/23/2016   Procedure: LEFT TOTAL KNEE ARTHROPLASTY;  Surgeon: Marybelle Killings, MD;  Location: [place];  Service: Orthopedics;  Laterality: Left;   Social History   Occupational History  . Occupation: Retired  Tobacco Use  . Smoking status: Former Research scientist (life sciences)  . Smokeless tobacco: Current User    Types: Chew  . Tobacco comment: quit 60 years ago  Vaping Use  . Vaping Use: Never used  Substance and Sexual Activity  . Alcohol use: No  . Drug use: No  . Sexual activity: Not Currently

## 2021-01-08 NOTE — Progress Notes (Signed)
Spoke with pt for pre-op call. Pt has hx of CAD with several stents about 10 years ago. No recent chest pain or sob. Pt is a type 2 diabetic. Last A1C was 7/7 on 12/12/20. He states his fasting blood sugar is usually between 150-170. Instructed pt not to take his Jardiance or Metformin in the AM. Instructed pt to take 1/2 of his regular dose of Lantus tonight (if needed). Instructed pt to check his blood sugar when he gets up in the AM. If blood sugar is 70 or below, treat with 1/2 cup of clear juice (apple or cranberry) and recheck blood sugar 15 minutes after drinking juice. If blood sugar continues to be 70 or below, call the Short Stay department and ask to speak to a nurse. Pt voiced understanding.  Covid test done today, result pending. Pt states he's been in quarantine since the test was done and understands that he stays in quarantine until he comes to the hospital tomorrow.

## 2021-01-09 ENCOUNTER — Encounter (HOSPITAL_COMMUNITY)
Admission: RE | Disposition: A | Discharge status: Home Health | Payer: Self-pay | Source: Home / Self Care | Attending: Orthopedic Surgery

## 2021-01-09 ENCOUNTER — Encounter (HOSPITAL_COMMUNITY): Payer: Self-pay | Admitting: Orthopedic Surgery

## 2021-01-09 ENCOUNTER — Other Ambulatory Visit: Payer: Self-pay

## 2021-01-09 ENCOUNTER — Inpatient Hospital Stay (HOSPITAL_COMMUNITY): Payer: Medicare Other | Admitting: Certified Registered"

## 2021-01-09 ENCOUNTER — Inpatient Hospital Stay (HOSPITAL_COMMUNITY)
Admission: RE | Admit: 2021-01-09 | Discharge: 2021-01-12 | DRG: 475 | Disposition: A | Discharge status: Home Health | Payer: Medicare Other | Attending: Orthopedic Surgery | Admitting: Orthopedic Surgery

## 2021-01-09 DIAGNOSIS — Y835 Amputation of limb(s) as the cause of abnormal reaction of the patient, or of later complication, without mention of misadventure at the time of the procedure: Secondary | ICD-10-CM | POA: Diagnosis present

## 2021-01-09 DIAGNOSIS — Z20822 Contact with and (suspected) exposure to covid-19: Secondary | ICD-10-CM | POA: Diagnosis present

## 2021-01-09 DIAGNOSIS — Z85828 Personal history of other malignant neoplasm of skin: Secondary | ICD-10-CM

## 2021-01-09 DIAGNOSIS — T8781 Dehiscence of amputation stump: Secondary | ICD-10-CM | POA: Diagnosis not present

## 2021-01-09 DIAGNOSIS — Z4800 Encounter for change or removal of nonsurgical wound dressing: Secondary | ICD-10-CM | POA: Diagnosis not present

## 2021-01-09 DIAGNOSIS — M545 Low back pain, unspecified: Secondary | ICD-10-CM | POA: Diagnosis not present

## 2021-01-09 DIAGNOSIS — Z7902 Long term (current) use of antithrombotics/antiplatelets: Secondary | ICD-10-CM | POA: Diagnosis not present

## 2021-01-09 DIAGNOSIS — I252 Old myocardial infarction: Secondary | ICD-10-CM

## 2021-01-09 DIAGNOSIS — M109 Gout, unspecified: Secondary | ICD-10-CM | POA: Diagnosis not present

## 2021-01-09 DIAGNOSIS — Z96641 Presence of right artificial hip joint: Secondary | ICD-10-CM | POA: Diagnosis not present

## 2021-01-09 DIAGNOSIS — Z79899 Other long term (current) drug therapy: Secondary | ICD-10-CM | POA: Diagnosis not present

## 2021-01-09 DIAGNOSIS — Z794 Long term (current) use of insulin: Secondary | ICD-10-CM | POA: Diagnosis not present

## 2021-01-09 DIAGNOSIS — T8130XA Disruption of wound, unspecified, initial encounter: Secondary | ICD-10-CM | POA: Diagnosis present

## 2021-01-09 DIAGNOSIS — F17220 Nicotine dependence, chewing tobacco, uncomplicated: Secondary | ICD-10-CM | POA: Diagnosis present

## 2021-01-09 DIAGNOSIS — Z9861 Coronary angioplasty status: Secondary | ICD-10-CM

## 2021-01-09 DIAGNOSIS — G8918 Other acute postprocedural pain: Secondary | ICD-10-CM | POA: Diagnosis not present

## 2021-01-09 DIAGNOSIS — Z888 Allergy status to other drugs, medicaments and biological substances status: Secondary | ICD-10-CM | POA: Diagnosis not present

## 2021-01-09 DIAGNOSIS — I10 Essential (primary) hypertension: Secondary | ICD-10-CM | POA: Diagnosis not present

## 2021-01-09 DIAGNOSIS — Z87442 Personal history of urinary calculi: Secondary | ICD-10-CM | POA: Diagnosis not present

## 2021-01-09 DIAGNOSIS — I2510 Atherosclerotic heart disease of native coronary artery without angina pectoris: Secondary | ICD-10-CM | POA: Diagnosis present

## 2021-01-09 DIAGNOSIS — E1140 Type 2 diabetes mellitus with diabetic neuropathy, unspecified: Secondary | ICD-10-CM | POA: Diagnosis present

## 2021-01-09 DIAGNOSIS — Z7984 Long term (current) use of oral hypoglycemic drugs: Secondary | ICD-10-CM | POA: Diagnosis not present

## 2021-01-09 DIAGNOSIS — Z833 Family history of diabetes mellitus: Secondary | ICD-10-CM | POA: Diagnosis not present

## 2021-01-09 DIAGNOSIS — E538 Deficiency of other specified B group vitamins: Secondary | ICD-10-CM | POA: Diagnosis present

## 2021-01-09 DIAGNOSIS — Z96652 Presence of left artificial knee joint: Secondary | ICD-10-CM | POA: Diagnosis not present

## 2021-01-09 DIAGNOSIS — Z7982 Long term (current) use of aspirin: Secondary | ICD-10-CM | POA: Diagnosis not present

## 2021-01-09 DIAGNOSIS — L02415 Cutaneous abscess of right lower limb: Secondary | ICD-10-CM | POA: Diagnosis present

## 2021-01-09 DIAGNOSIS — Z885 Allergy status to narcotic agent status: Secondary | ICD-10-CM

## 2021-01-09 DIAGNOSIS — G8929 Other chronic pain: Secondary | ICD-10-CM | POA: Diagnosis not present

## 2021-01-09 DIAGNOSIS — T8743 Infection of amputation stump, right lower extremity: Secondary | ICD-10-CM | POA: Diagnosis not present

## 2021-01-09 DIAGNOSIS — Z8249 Family history of ischemic heart disease and other diseases of the circulatory system: Secondary | ICD-10-CM

## 2021-01-09 DIAGNOSIS — Z8616 Personal history of COVID-19: Secondary | ICD-10-CM | POA: Diagnosis not present

## 2021-01-09 HISTORY — DX: COVID-19: U07.1

## 2021-01-09 HISTORY — PX: STUMP REVISION: SHX6102

## 2021-01-09 LAB — CBC
HCT: 37.2 % — ABNORMAL LOW (ref 39.0–52.0)
Hemoglobin: 12.3 g/dL — ABNORMAL LOW (ref 13.0–17.0)
MCH: 29.9 pg (ref 26.0–34.0)
MCHC: 33.1 g/dL (ref 30.0–36.0)
MCV: 90.3 fL (ref 80.0–100.0)
Platelets: 171 10*3/uL (ref 150–400)
RBC: 4.12 MIL/uL — ABNORMAL LOW (ref 4.22–5.81)
RDW: 14.8 % (ref 11.5–15.5)
WBC: 6.7 10*3/uL (ref 4.0–10.5)
nRBC: 0 % (ref 0.0–0.2)

## 2021-01-09 LAB — GLUCOSE, CAPILLARY
Glucose-Capillary: 100 mg/dL — ABNORMAL HIGH (ref 70–99)
Glucose-Capillary: 160 mg/dL — ABNORMAL HIGH (ref 70–99)
Glucose-Capillary: 352 mg/dL — ABNORMAL HIGH (ref 70–99)
Glucose-Capillary: 400 mg/dL — ABNORMAL HIGH (ref 70–99)
Glucose-Capillary: 70 mg/dL (ref 70–99)
Glucose-Capillary: 70 mg/dL (ref 70–99)
Glucose-Capillary: 71 mg/dL (ref 70–99)

## 2021-01-09 LAB — BASIC METABOLIC PANEL
Anion gap: 7 (ref 5–15)
BUN: 20 mg/dL (ref 8–23)
CO2: 29 mmol/L (ref 22–32)
Calcium: 9.8 mg/dL (ref 8.9–10.3)
Chloride: 102 mmol/L (ref 98–111)
Creatinine, Ser: 0.94 mg/dL (ref 0.61–1.24)
GFR, Estimated: >60 mL/min (ref >60)
Glucose, Bld: 69 mg/dL — ABNORMAL LOW (ref 70–99)
Potassium: 3.5 mmol/L (ref 3.5–5.1)
Sodium: 138 mmol/L (ref 135–145)

## 2021-01-09 SURGERY — REVISION, AMPUTATION SITE
Anesthesia: Monitor Anesthesia Care | Laterality: Right

## 2021-01-09 MED ORDER — TERAZOSIN HCL 5 MG PO CAPS
5.0000 mg | ORAL_CAPSULE | Freq: Every day | ORAL | Status: DC
  Administered 2021-01-09 – 2021-01-11 (×3): 5 mg via ORAL
  Filled 2021-01-09 (×3): qty 1

## 2021-01-09 MED ORDER — NITROGLYCERIN 0.4 MG SL SUBL: 0.4000 mg | SUBLINGUAL_TABLET | SUBLINGUAL | Status: DC | PRN

## 2021-01-09 MED ORDER — ALLOPURINOL 300 MG PO TABS
300.0000 mg | ORAL_TABLET | Freq: Every day | ORAL | Status: DC
  Administered 2021-01-09 – 2021-01-12 (×4): 300 mg via ORAL
  Filled 2021-01-09 (×4): qty 1

## 2021-01-09 MED ORDER — INSULIN ASPART 100 UNIT/ML ~~LOC~~ SOLN
0.0000 [IU] | Freq: Three times a day (TID) | SUBCUTANEOUS | Status: DC
  Administered 2021-01-09 – 2021-01-10 (×2): 9 [IU] via SUBCUTANEOUS
  Administered 2021-01-10: 3 [IU] via SUBCUTANEOUS
  Administered 2021-01-10: 2 [IU] via SUBCUTANEOUS
  Administered 2021-01-11: 5 [IU] via SUBCUTANEOUS
  Administered 2021-01-11: 2 [IU] via SUBCUTANEOUS
  Administered 2021-01-11: 5 [IU] via SUBCUTANEOUS
  Administered 2021-01-12: 7 [IU] via SUBCUTANEOUS

## 2021-01-09 MED ORDER — FENTANYL CITRATE (PF) 100 MCG/2ML IJ SOLN
INTRAMUSCULAR | Status: AC
  Administered 2021-01-09: 50 ug
  Filled 2021-01-09: qty 2

## 2021-01-09 MED ORDER — MIDAZOLAM HCL 2 MG/2ML IJ SOLN
INTRAMUSCULAR | Status: AC
  Filled 2021-01-09: qty 2

## 2021-01-09 MED ORDER — ASPIRIN EC 81 MG PO TBEC
81.0000 mg | DELAYED_RELEASE_TABLET | Freq: Every day | ORAL | Status: DC
  Administered 2021-01-09 – 2021-01-12 (×4): 81 mg via ORAL
  Filled 2021-01-09 (×4): qty 1

## 2021-01-09 MED ORDER — CLOPIDOGREL BISULFATE 75 MG PO TABS
75.0000 mg | ORAL_TABLET | Freq: Every day | ORAL | Status: DC
  Administered 2021-01-09 – 2021-01-12 (×4): 75 mg via ORAL
  Filled 2021-01-09 (×4): qty 1

## 2021-01-09 MED ORDER — CHLORHEXIDINE GLUCONATE 0.12 % MT SOLN
15.0000 mL | Freq: Once | OROMUCOSAL | Status: AC
  Administered 2021-01-09: 15 mL via OROMUCOSAL
  Filled 2021-01-09: qty 15

## 2021-01-09 MED ORDER — TRAZODONE HCL 50 MG PO TABS
25.0000 mg | ORAL_TABLET | Freq: Every evening | ORAL | Status: DC | PRN
Start: 2021-01-09 — End: 2021-01-12
  Administered 2021-01-10: 50 mg via ORAL
  Filled 2021-01-09: qty 1

## 2021-01-09 MED ORDER — POTASSIUM CHLORIDE CRYS ER 20 MEQ PO TBCR: 20.0000 meq | EXTENDED_RELEASE_TABLET | Freq: Every day | ORAL | Status: DC | PRN

## 2021-01-09 MED ORDER — PROPOFOL 10 MG/ML IV BOLUS
INTRAVENOUS | Status: AC
  Filled 2021-01-09: qty 40

## 2021-01-09 MED ORDER — EPHEDRINE SULFATE-NACL 50-0.9 MG/10ML-% IV SOSY
PREFILLED_SYRINGE | INTRAVENOUS | Status: DC | PRN
  Administered 2021-01-09: 5 mg via INTRAVENOUS
  Administered 2021-01-09: 10 mg via INTRAVENOUS

## 2021-01-09 MED ORDER — GLUCERNA SHAKE PO LIQD
237.0000 mL | Freq: Two times a day (BID) | ORAL | Status: DC
  Administered 2021-01-10 – 2021-01-12 (×4): 237 mL via ORAL

## 2021-01-09 MED ORDER — FENTANYL CITRATE (PF) 100 MCG/2ML IJ SOLN: 50.0000 ug | Freq: Once | INTRAMUSCULAR | Status: DC

## 2021-01-09 MED ORDER — OXYCODONE HCL 5 MG PO TABS
5.0000 mg | ORAL_TABLET | Freq: Once | ORAL | Status: AC | PRN
  Administered 2021-01-09: 5 mg via ORAL

## 2021-01-09 MED ORDER — VITAMIN B-12 1000 MCG PO TABS
2500.0000 ug | ORAL_TABLET | Freq: Every day | ORAL | Status: DC
  Administered 2021-01-09 – 2021-01-12 (×4): 2500 ug via ORAL
  Filled 2021-01-09 (×4): qty 3

## 2021-01-09 MED ORDER — METOPROLOL TARTRATE 5 MG/5ML IV SOLN: 2.0000 mg | INTRAVENOUS | Status: DC | PRN

## 2021-01-09 MED ORDER — ALUM & MAG HYDROXIDE-SIMETH 200-200-20 MG/5ML PO SUSP: 15.0000 mL | ORAL | Status: DC | PRN

## 2021-01-09 MED ORDER — PROPOFOL 10 MG/ML IV BOLUS
INTRAVENOUS | Status: DC | PRN
  Administered 2021-01-09 (×2): 20 mg via INTRAVENOUS
  Administered 2021-01-09: 100 mg via INTRAVENOUS

## 2021-01-09 MED ORDER — METHOCARBAMOL 500 MG PO TABS
500.0000 mg | ORAL_TABLET | Freq: Four times a day (QID) | ORAL | Status: DC | PRN
  Administered 2021-01-09 – 2021-01-11 (×4): 500 mg via ORAL
  Filled 2021-01-09 (×4): qty 1

## 2021-01-09 MED ORDER — PHENOL 1.4 % MT LIQD: 1.0000 | OROMUCOSAL | Status: DC | PRN

## 2021-01-09 MED ORDER — CEFAZOLIN SODIUM-DEXTROSE 2-4 GM/100ML-% IV SOLN
2.0000 g | INTRAVENOUS | Status: AC
  Administered 2021-01-09: 2 g via INTRAVENOUS
  Filled 2021-01-09: qty 100

## 2021-01-09 MED ORDER — DOCUSATE SODIUM 100 MG PO CAPS
100.0000 mg | ORAL_CAPSULE | Freq: Every day | ORAL | Status: DC
  Administered 2021-01-10 – 2021-01-12 (×3): 100 mg via ORAL
  Filled 2021-01-09 (×4): qty 1

## 2021-01-09 MED ORDER — LACTATED RINGERS IV SOLN: INTRAVENOUS | Status: DC

## 2021-01-09 MED ORDER — LOSARTAN POTASSIUM 50 MG PO TABS
50.0000 mg | ORAL_TABLET | Freq: Every day | ORAL | Status: DC
  Administered 2021-01-09 – 2021-01-12 (×4): 50 mg via ORAL
  Filled 2021-01-09 (×4): qty 1

## 2021-01-09 MED ORDER — ONDANSETRON HCL 4 MG/2ML IJ SOLN: 4.0000 mg | Freq: Once | INTRAMUSCULAR | Status: DC | PRN

## 2021-01-09 MED ORDER — ORAL CARE MOUTH RINSE: 15.0000 mL | Freq: Once | OROMUCOSAL | Status: AC

## 2021-01-09 MED ORDER — ROPIVACAINE HCL 5 MG/ML IJ SOLN
INTRAMUSCULAR | Status: DC | PRN
  Administered 2021-01-09: 40 mL via PERINEURAL

## 2021-01-09 MED ORDER — FENTANYL CITRATE (PF) 100 MCG/2ML IJ SOLN
25.0000 ug | INTRAMUSCULAR | Status: DC | PRN
  Administered 2021-01-09 (×2): 25 ug via INTRAVENOUS

## 2021-01-09 MED ORDER — ACETAMINOPHEN 325 MG PO TABS
325.0000 mg | ORAL_TABLET | Freq: Four times a day (QID) | ORAL | Status: DC | PRN
Start: 2021-01-10 — End: 2021-01-12
  Administered 2021-01-10: 650 mg via ORAL
  Filled 2021-01-09: qty 2

## 2021-01-09 MED ORDER — OXYCODONE-ACETAMINOPHEN 5-325 MG PO TABS
1.0000 | ORAL_TABLET | ORAL | Status: DC | PRN
  Administered 2021-01-09 – 2021-01-10 (×4): 1 via ORAL
  Administered 2021-01-11: 2 via ORAL
  Filled 2021-01-09: qty 1
  Filled 2021-01-09: qty 2
  Filled 2021-01-09 (×3): qty 1

## 2021-01-09 MED ORDER — GABAPENTIN 400 MG PO CAPS
400.0000 mg | ORAL_CAPSULE | Freq: Three times a day (TID) | ORAL | Status: DC
  Administered 2021-01-09 – 2021-01-12 (×9): 400 mg via ORAL
  Filled 2021-01-09 (×9): qty 1

## 2021-01-09 MED ORDER — OXYCODONE HCL 5 MG/5ML PO SOLN
5.0000 mg | Freq: Once | ORAL | Status: AC | PRN
Start: 2021-01-09 — End: 2021-01-09

## 2021-01-09 MED ORDER — ACETAMINOPHEN 500 MG PO TABS
1000.0000 mg | ORAL_TABLET | Freq: Once | ORAL | Status: AC
  Administered 2021-01-09: 1000 mg via ORAL
  Filled 2021-01-09: qty 2

## 2021-01-09 MED ORDER — ONDANSETRON HCL 4 MG/2ML IJ SOLN: 4.0000 mg | Freq: Four times a day (QID) | INTRAMUSCULAR | Status: DC | PRN

## 2021-01-09 MED ORDER — GUAIFENESIN-DM 100-10 MG/5ML PO SYRP
15.0000 mL | ORAL_SOLUTION | ORAL | Status: DC | PRN
  Administered 2021-01-12: 15 mL via ORAL
  Filled 2021-01-09: qty 20

## 2021-01-09 MED ORDER — OXYCODONE HCL 5 MG PO TABS
ORAL_TABLET | ORAL | Status: AC
  Filled 2021-01-09: qty 1

## 2021-01-09 MED ORDER — LIDOCAINE 2% (20 MG/ML) 5 ML SYRINGE
INTRAMUSCULAR | Status: AC
  Filled 2021-01-09: qty 15

## 2021-01-09 MED ORDER — SODIUM CHLORIDE 0.9 % IV SOLN: INTRAVENOUS | Status: DC

## 2021-01-09 MED ORDER — CEFAZOLIN SODIUM-DEXTROSE 1-4 GM/50ML-% IV SOLN
1.0000 g | Freq: Three times a day (TID) | INTRAVENOUS | Status: DC
  Administered 2021-01-09 – 2021-01-12 (×8): 1 g via INTRAVENOUS
  Filled 2021-01-09 (×9): qty 50

## 2021-01-09 MED ORDER — VITAMIN D 25 MCG (1000 UNIT) PO TABS
2000.0000 [IU] | ORAL_TABLET | Freq: Every day | ORAL | Status: DC
  Administered 2021-01-09 – 2021-01-12 (×4): 2000 [IU] via ORAL
  Filled 2021-01-09 (×4): qty 2

## 2021-01-09 MED ORDER — ISOSORBIDE MONONITRATE ER 30 MG PO TB24
30.0000 mg | ORAL_TABLET | Freq: Every day | ORAL | Status: DC
  Administered 2021-01-09 – 2021-01-12 (×4): 30 mg via ORAL
  Filled 2021-01-09 (×4): qty 1

## 2021-01-09 MED ORDER — INSULIN DETEMIR 100 UNIT/ML ~~LOC~~ SOLN
12.0000 [IU] | Freq: Every day | SUBCUTANEOUS | Status: DC
  Administered 2021-01-09 – 2021-01-11 (×3): 12 [IU] via SUBCUTANEOUS
  Filled 2021-01-09 (×5): qty 0.12

## 2021-01-09 MED ORDER — ZINC SULFATE 220 (50 ZN) MG PO CAPS
220.0000 mg | ORAL_CAPSULE | Freq: Every day | ORAL | Status: DC
  Administered 2021-01-09 – 2021-01-12 (×4): 220 mg via ORAL
  Filled 2021-01-09 (×4): qty 1

## 2021-01-09 MED ORDER — DEXAMETHASONE SODIUM PHOSPHATE 10 MG/ML IJ SOLN
INTRAMUSCULAR | Status: DC | PRN
  Administered 2021-01-09: 10 mg

## 2021-01-09 MED ORDER — HYDROCHLOROTHIAZIDE 25 MG PO TABS
12.5000 mg | ORAL_TABLET | Freq: Every day | ORAL | Status: DC
  Administered 2021-01-09 – 2021-01-12 (×4): 12.5 mg via ORAL
  Filled 2021-01-09 (×4): qty 1

## 2021-01-09 MED ORDER — FENTANYL CITRATE (PF) 100 MCG/2ML IJ SOLN
INTRAMUSCULAR | Status: AC
  Filled 2021-01-09: qty 2

## 2021-01-09 MED ORDER — METFORMIN HCL 500 MG PO TABS: 1000.0000 mg | ORAL_TABLET | Freq: Two times a day (BID) | ORAL | Status: DC

## 2021-01-09 MED ORDER — MORPHINE SULFATE (PF) 2 MG/ML IV SOLN
0.5000 mg | INTRAVENOUS | Status: DC | PRN
  Administered 2021-01-10: 0.5 mg via INTRAVENOUS
  Administered 2021-01-11: 1 mg via INTRAVENOUS
  Filled 2021-01-09 (×2): qty 1

## 2021-01-09 MED ORDER — HYDRALAZINE HCL 20 MG/ML IJ SOLN
5.0000 mg | INTRAMUSCULAR | Status: DC | PRN
  Filled 2021-01-09: qty 1

## 2021-01-09 MED ORDER — 0.9 % SODIUM CHLORIDE (POUR BTL) OPTIME
TOPICAL | Status: DC | PRN
  Administered 2021-01-09: 1000 mL

## 2021-01-09 MED ORDER — AMLODIPINE BESYLATE 5 MG PO TABS
5.0000 mg | ORAL_TABLET | Freq: Every day | ORAL | Status: DC
  Administered 2021-01-09 – 2021-01-12 (×4): 5 mg via ORAL
  Filled 2021-01-09 (×4): qty 1

## 2021-01-09 MED ORDER — LABETALOL HCL 5 MG/ML IV SOLN: 10.0000 mg | INTRAVENOUS | Status: DC | PRN

## 2021-01-09 MED ORDER — MAGNESIUM SULFATE 2 GM/50ML IV SOLN
2.0000 g | Freq: Every day | INTRAVENOUS | Status: DC | PRN
  Filled 2021-01-09: qty 50

## 2021-01-09 MED ORDER — EMPAGLIFLOZIN 25 MG PO TABS
25.0000 mg | ORAL_TABLET | Freq: Every day | ORAL | Status: DC
  Administered 2021-01-09 – 2021-01-12 (×4): 25 mg via ORAL
  Filled 2021-01-09 (×4): qty 1

## 2021-01-09 MED ORDER — PANTOPRAZOLE SODIUM 40 MG PO TBEC
40.0000 mg | DELAYED_RELEASE_TABLET | Freq: Every day | ORAL | Status: DC
  Administered 2021-01-09 – 2021-01-12 (×4): 40 mg via ORAL
  Filled 2021-01-09 (×4): qty 1

## 2021-01-09 SURGICAL SUPPLY — 33 items
BLADE SAW RECIP 87.9 MT (BLADE) ×2 IMPLANT
BLADE SURG 21 STRL SS (BLADE) ×3 IMPLANT
CANISTER WOUND CARE 500ML ATS (WOUND CARE) ×3 IMPLANT
COVER SURGICAL LIGHT HANDLE (MISCELLANEOUS) ×3 IMPLANT
COVER WAND RF STERILE (DRAPES) ×3 IMPLANT
DRAPE DERMATAC (DRAPES) ×4 IMPLANT
DRAPE EXTREMITY T 121X128X90 (DISPOSABLE) ×3 IMPLANT
DRAPE HALF SHEET 40X57 (DRAPES) ×3 IMPLANT
DRAPE INCISE IOBAN 66X45 STRL (DRAPES) ×3 IMPLANT
DRAPE U-SHAPE 47X51 STRL (DRAPES) ×6 IMPLANT
DRESSING PREVENA PLUS CUSTOM (GAUZE/BANDAGES/DRESSINGS) ×1 IMPLANT
DRSG PREVENA PLUS CUSTOM (GAUZE/BANDAGES/DRESSINGS) ×3
DURAPREP 26ML APPLICATOR (WOUND CARE) ×3 IMPLANT
ELECT REM PT RETURN 9FT ADLT (ELECTROSURGICAL) ×3
ELECTRODE REM PT RTRN 9FT ADLT (ELECTROSURGICAL) ×1 IMPLANT
GLOVE BIOGEL PI IND STRL 9 (GLOVE) ×1 IMPLANT
GLOVE BIOGEL PI INDICATOR 9 (GLOVE) ×2
GLOVE SURG ORTHO 9.0 STRL STRW (GLOVE) ×3 IMPLANT
GOWN STRL REUS W/ TWL XL LVL3 (GOWN DISPOSABLE) ×2 IMPLANT
GOWN STRL REUS W/TWL XL LVL3 (GOWN DISPOSABLE) ×6
KIT BASIN OR (CUSTOM PROCEDURE TRAY) ×3 IMPLANT
KIT TURNOVER KIT B (KITS) ×3 IMPLANT
MANIFOLD NEPTUNE II (INSTRUMENTS) ×3 IMPLANT
NS IRRIG 1000ML POUR BTL (IV SOLUTION) ×3 IMPLANT
PACK GENERAL/GYN (CUSTOM PROCEDURE TRAY) ×3 IMPLANT
PAD ARMBOARD 7.5X6 YLW CONV (MISCELLANEOUS) ×3 IMPLANT
PREVENA RESTOR ARTHOFORM 46X30 (CANNISTER) ×3 IMPLANT
PREVENA RESTOR AXIOFORM 29X28 (GAUZE/BANDAGES/DRESSINGS) ×2 IMPLANT
STAPLER VISISTAT 35W (STAPLE) ×2 IMPLANT
SUT ETHILON 2 0 PSLX (SUTURE) ×6 IMPLANT
SUT SILK 2 0 (SUTURE) ×3
SUT SILK 2-0 18XBRD TIE 12 (SUTURE) IMPLANT
TOWEL GREEN STERILE (TOWEL DISPOSABLE) ×3 IMPLANT

## 2021-01-09 NOTE — Anesthesia Preprocedure Evaluation (Addendum)
Anesthesia Evaluation  Patient identified by MRN, date of birth, ID band Patient awake    Reviewed: Allergy & Precautions, NPO status , Patient's Chart, lab work & pertinent test results  Airway Mallampati: II  TM Distance: >3 FB Neck ROM: Full    Dental  (+) Edentulous Upper, Edentulous Lower   Pulmonary former smoker,    Pulmonary exam normal breath sounds clear to auscultation       Cardiovascular hypertension, Pt. on medications + CAD, + Past MI, + Cardiac Stents (2004, 2012, 2013- last took plavix yesterday ) and + Peripheral Vascular Disease  Normal cardiovascular exam Rhythm:Regular Rate:Normal     Neuro/Psych  Headaches,  Neuromuscular disease (peripheral neuropathy 2/2 T2DM) negative psych ROS   GI/Hepatic hiatal hernia, GERD  Controlled,Gilbert syn   Endo/Other  diabetes, Poorly Controlled, Type 2, Insulin Dependent, Oral Hypoglycemic Agentsa1c 7.7  Renal/GU negative Renal ROS  negative genitourinary   Musculoskeletal  (+) Arthritis , Osteoarthritis,  Abscess R BKA Takes oxy once-twice per day   Abdominal   Peds  Hematology  (+) Blood dyscrasia, anemia ,   Anesthesia Other Findings   Reproductive/Obstetrics negative OB ROS                            Anesthesia Physical Anesthesia Plan  ASA: III  Anesthesia Plan: Regional and MAC   Post-op Pain Management:  Regional for Post-op pain   Induction: Intravenous  PONV Risk Score and Plan: 2 and Ondansetron, Dexamethasone, Midazolam and Treatment may vary due to age or medical condition  Airway Management Planned: Natural Airway and Simple Face Mask  Additional Equipment: None  Intra-op Plan:   Post-operative Plan:   Informed Consent: I have reviewed the patients History and Physical, chart, labs and discussed the procedure including the risks, benefits and alternatives for the proposed anesthesia with the patient or  authorized representative who has indicated his/her understanding and acceptance.       Plan Discussed with: CRNA  Anesthesia Plan Comments:       Anesthesia Quick Evaluation

## 2021-01-09 NOTE — Transfer of Care (Signed)
Immediate Anesthesia Transfer of Care Note  Patient: Keith Espinoza  Procedure(s) Performed: REVISION RIGHT BELOW KNEE AMPUTATION (Right )  Patient Location: PACU  Anesthesia Type:General and Regional  Level of Consciousness: drowsy and patient cooperative  Airway & Oxygen Therapy: Patient Spontanous Breathing and Patient connected to face mask oxygen  Post-op Assessment: Report given to RN and Post -op Vital signs reviewed and stable  Post vital signs: Reviewed and stable  Last Vitals:  Vitals Value Taken Time  BP 167/70 01/09/21 1111  Temp    Pulse 74 01/09/21 1114  Resp 15 01/09/21 1114  SpO2 100 % 01/09/21 1114  Vitals shown include unvalidated device data.  Last Pain:  Vitals:   01/09/21 0843  TempSrc:   PainSc: 0-No pain         Complications: No complications documented.

## 2021-01-09 NOTE — H&P (Signed)
Keith Espinoza is an 80 y.o. male.   Chief Complaint: Right below Knee Amputation Wound dehiscence HPI: Patient is an 80 year old gentleman who is 4 weeks status post right transtibial amputation.  Patient is currently on doxycycline wearing a stump shrinker and a limb protector.  Patient notes acute purulent drainage and acute pain had difficulty sleeping last night.  Past Medical History:  Diagnosis Date  . Allergic rhinitis   . Allergic rhinitis   . Arthritis   . Basal cell carcinoma 11/01/2019    bcc left chest treatment TX cx3 65fu   . Chronic leg pain    right  . Chronic lower back pain   . Coronary artery disease    a. Stenting to RCA 2004; staged DES to LAD and Cx 2004. DES to mRCA 2012. b. DES to mCx, PTCA to dCx 11/2011. c. Lateral wall MI 2013 s/p PTCA to distal Cx & DES to mid OM2 11/2011. d. Low risk nuc 04/2014, EF wnl.  Marland Kitchen COVID-19   . Diabetes mellitus    Insulin dependent  . Diabetic neuropathy (HCC)    MILD  . Diverticulosis   . Dysrhythmia   . Gilbert syndrome   . Gout    right wrist; right foot; right elbow; have had it since 1970's  . H/O hiatal hernia   . Heart murmur   . History of echocardiogram    aortic sclerosis per echo 12/09 EF 65%, otherwise normal  . History of hemorrhoids    BLEEDING  . History of kidney stones    h/o  . Hypertension    Diagnosed 1995   . Myocardial infarction ([place])   . Pancreatic pseudocyst    a. s/p remote drainage 2006.  Marland Kitchen Thrombocytopenia ([place])    Seen on oldest labs in system from 2004  . Vitamin B 12 deficiency    orally replaced    Past Surgical History:  Procedure Laterality Date  . ABDOMINAL AORTOGRAM W/LOWER EXTREMITY Bilateral 08/08/2020   Procedure: ABDOMINAL AORTOGRAM W/LOWER EXTREMITY;  Surgeon: Angelia Mould, MD;  Location: [facility];  Service: Cardiovascular;  Laterality: Bilateral;  . AMPUTATION Right 12/12/2020   Procedure: RIGHT BELOW KNEE AMPUTATION;  Surgeon: [name], MD;   Location: [facility];  Service: Orthopedics;  Laterality: Right;  . BACK SURGERY     "total of 3 times" S/P fall   . CARPAL TUNNEL RELEASE Bilateral   . CHOLECYSTECTOMY  1990's  . COLONOSCOPY    . CORONARY ANGIOPLASTY  11/11/11  . CORONARY ANGIOPLASTY WITH STENT PLACEMENT  09/30/2011   "1 then; makes a total of 4"  . CORONARY ANGIOPLASTY WITH STENT PLACEMENT  11/11/11   "1; makes a total of 5"  . INGUINAL HERNIA REPAIR  2003   right  . JOINT REPLACEMENT Right 04/03/2002   hip replacment  . KNEE ARTHROSCOPY  1990's   left  . LEFT HEART CATHETERIZATION WITH CORONARY ANGIOGRAM N/A 09/30/2011   Procedure: LEFT HEART CATHETERIZATION WITH CORONARY ANGIOGRAM;  Surgeon: Jettie Booze, MD;  Location[facility] C[facility] LAB;  Service: Cardiovascular;  Laterality: N/A;  possible PCI  . LEFT HEART CATHETERIZATION WITH CORONARY ANGIOGRAM N/A 11/15/2011   Procedure: LEFT HEART CATHETERIZATION WITH CORONARY ANGIOGRAM;  Surgeon: Jettie Booze, MD;  Location: [facility] CATH LAB;  Service: Cardiovascular;  Laterality: N/A;  . PERCUTANEOUS CORONARY STENT INTERVENTION (PCI-S)  09/30/2011   Procedure: PERCUTANEOUS CORONARY STENT INTERVENTION (PCI-S);  Surgeon: Jettie Booze, MD;  Location: [facility] C[facility] LAB;  Service: Cardiovascular;;  . PERCUTANEOUS CORONARY STENT INTERVENTION (PCI-S) N/A 11/11/2011   Procedure: PERCUTANEOUS CORONARY STENT INTERVENTION (PCI-S);  Surgeon: Jettie Booze, MD;  Location: [facility] CATH LAB;  Service: Cardiovascular;  Laterality: N/A;  . PERIPHERAL VASCULAR BALLOON ANGIOPLASTY Right 08/08/2020   Procedure: PERIPHERAL VASCULAR BALLOON ANGIOPLASTY;  Surgeon: Angelia Mould, MD;  Location: [facility];  Service: Cardiovascular;  Laterality: Right;  Posterior tibial   . SHOULDER SURGERY Right    X 2  . TONSILLECTOMY  ~ 1948  . TOTAL HIP REVISION Right 04/13/2019   Procedure: RIGHT TOTAL HIP REVISION-POSTERIOR  APPROACH LATERAL;  Surgeon: Marybelle Killings, MD;  Location: [facility];  Service:  Orthopedics;  Laterality: Right;  . TOTAL KNEE ARTHROPLASTY Left 07/23/2016   Procedure: LEFT TOTAL KNEE ARTHROPLASTY;  Surgeon: Marybelle Killings, MD;  Location: [place];  Service: Orthopedics;  Laterality: Left;    Family History  Problem Relation Age of Onset  . Diabetes Mother   . Hyperlipidemia Mother   . Hypertension Mother   . Cancer Father   . Hypertension Father   . Diabetes Sister   . Hypertension Sister   . Cancer Brother   . Heart attack Neg Hx    Social History:  reports that he has quit smoking. His smokeless tobacco use includes chew. He reports that he does not drink alcohol and does not use drugs.  Allergies:  Allergies  Allergen Reactions  . Simvastatin Other (See Comments)    SEVERE MYALGIAS   . Zetia [Ezetimibe] Other (See Comments)    MYALGIAS  . Dilaudid [Hydromorphone Hcl] Other (See Comments)    hallucination    No medications prior to admission.    Results for orders placed or performed during the hospital encounter of 01/08/21 (from the past 48 hour(s))  SARS CORONAVIRUS 2 (TAT 6-24 HRS) Nasopharyngeal Nasopharyngeal Swab     Status: None   Collection Time: 01/08/21 12:00 PM   Specimen: Nasopharyngeal Swab  Result Value Ref Range   SARS Coronavirus 2 NEGATIVE NEGATIVE    Comment: (NOTE) SARS-CoV-2 target nucleic acids are NOT DETECTED.  The SARS-CoV-2 RNA is generally detectable in upper and lower respiratory specimens during the acute phase of infection. Negative results do not preclude SARS-CoV-2 infection, do not rule out co-infections with other pathogens, and should not be used as the sole basis for treatment or other patient management decisions. Negative results must be combined with clinical observations, patient history, and epidemiological information. The expected result is Negative.  Fact Sheet for Patients: SugarRoll.be  Fact Sheet for Healthcare  Providers: https://www.woods-mathews.com/  This test is not yet approved or cleared by the [place] FDA and  has been authorized for detection and/or diagnosis of SARS-CoV-2 by FDA under an Emergency Use Authorization (EUA). This EUA will remain  in effect (meaning this test can be used) for the duration of the COVID-19 declaration under Se ction 564(b)(1) of the Act, 21 U.S.C. section 360bbb-3(b)(1), unless the authorization is terminated or revoked sooner.  Performed at [facility], [place] [address], [place], [place] [address]    No results found.  Review of Systems  All other systems reviewed and are negative.   There were no vitals taken for this visit. Physical Exam  Patient is alert, oriented, no adenopathy, well-dressed, normal affect, normal respiratory effort. Examination patient has cellulitis of the medial aspect of the below the knee amputation.  There is a black eschar and there is now purulent drainage  from the residual limb medially.Lungs Clear Heart RRR Assessment/Plan 1. Dehiscence of amputation stump ([place])     Plan: We will plan for revision of the below-knee amputation tomorrow plan for 3-day hospitalization.  Discussed that with the acute purulence and pain and ischemic changes despite oral antibiotics we need to proceed with a surgical debridement of the infected soft tissue with a revision of the amputation.  Follow-Up Instructions: Return in about 1 week (around 01/15/2021).    Bevely Palmer Fount Bahe, PA 01/09/2021, 6:40 AM

## 2021-01-09 NOTE — Op Note (Signed)
01/09/2021  11:15 AM  PATIENT:  Keith Espinoza    PRE-OPERATIVE DIAGNOSIS:  Abscess Right Below Knee Amputation  POST-OPERATIVE DIAGNOSIS:  Same  PROCEDURE:  REVISION RIGHT BELOW KNEE AMPUTATION  SURGEON:  [name], MD  PHYSICIAN ASSISTANT:None ANESTHESIA:   General  PREOPERATIVE INDICATIONS:  Keith Espinoza is a  80 y.o. male with a diagnosis of Abscess Right Below Knee Amputation who failed conservative measures and elected for surgical management.    The risks benefits and alternatives were discussed with the patient preoperatively including but not limited to the risks of infection, bleeding, nerve injury, cardiopulmonary complications, the need for revision surgery, among others, and the patient was willing to proceed.  OPERATIVE IMPLANTS: Praveena customizable and Arthur form wound VAC  @ENCIMAGES @  OPERATIVE FINDINGS: Ischemic muscle, the surgical margins were proximal to the abscess.  OPERATIVE PROCEDURE: Patient was brought the operating room after undergoing a regional anesthetic he underwent a general.  After adequate levels anesthesia were obtained patient's right lower extremity was prepped using DuraPrep draped into a sterile field a timeout was called.  A fishmouth incision was made around the necrotic abscess tissue this was carried sharply down to bone the distal 2 cm of bone was resected.  Both the tibia and fibula were resected the tibia was beveled anteriorly.  Patient had extensive necrotic muscle and the rondure was used to excise muscle and fascia back to healthy viable tissue.  The wound was irrigated with normal saline vascular bundle was suture ligated with 2-0 silk.  The deep and superficial fascia layers and skin was closed using a far near near far suture technique.  Staples were used to close the skin.  The Prevena customizable and Arnell Sieving form wound VAC was applied this had a good suction fit after outlining with derma tack patient was extubated taken the  PACU in stable condition.   DISCHARGE PLANNING:  Antibiotic duration: Continue IV antibiotics  Weightbearing: Nonweightbearing on the right  Pain medication: Opioid pathway  Dressing care/ Wound VAC: Continue wound VAC for 1 week  Ambulatory devices: Walker  Discharge to: Anticipate return to home in 3 days.  Follow-up: In the office 1 week post operative.

## 2021-01-09 NOTE — Interval H&P Note (Signed)
History and Physical Interval Note:  01/09/2021 11:23 AM  Keith Espinoza  has presented today for surgery, with the diagnosis of Abscess Right Below Knee Amputation.  The various methods of treatment have been discussed with the patient and family. After consideration of risks, benefits and other options for treatment, the patient has consented to  Procedure(s): REVISION RIGHT BELOW KNEE AMPUTATION (Right) as a surgical intervention.  The patient's history has been reviewed, patient examined, no change in status, stable for surgery.  I have reviewed the patient's chart and labs.  Questions were answered to the patient's satisfaction.     [name]

## 2021-01-09 NOTE — Anesthesia Procedure Notes (Signed)
Procedure Name: LMA Insertion Date/Time: 01/09/2021 10:37 AM Performed by: Gwyndolyn Saxon, CRNA Pre-anesthesia Checklist: Patient identified, Emergency Drugs available, Suction available and Patient being monitored Patient Re-evaluated:Patient Re-evaluated prior to induction Oxygen Delivery Method: Circle system utilized Preoxygenation: Pre-oxygenation with 100% oxygen Induction Type: IV induction Ventilation: Mask ventilation without difficulty LMA: LMA inserted LMA Size: 4.0 Number of attempts: 1 Placement Confirmation: positive ETCO2 and breath sounds checked- equal and bilateral Tube secured with: Tape Dental Injury: Teeth and Oropharynx as per pre-operative assessment

## 2021-01-09 NOTE — Progress Notes (Signed)
1235 Received pt from PACU, A&O x4. Right BKA with wound vac dressing dry and intact, Prevena vac in place and charged. Denies pain at this time.

## 2021-01-09 NOTE — Progress Notes (Addendum)
Inpatient Diabetes Program Recommendations  AACE/ADA: New Consensus Statement on Inpatient Glycemic Control (2015)  Target Ranges:  Prepandial:   less than 140 mg/dL      Peak postprandial:   less than 180 mg/dL (1-2 hours)      Critically ill patients:  140 - 180 mg/dL   Lab Results  Component Value Date   GLUCAP 160 (H) 01/09/2021   HGBA1C 7.7 (H) 12/12/2020    Review of Glycemic Control Results for Keith Espinoza, Keith Espinoza (MRN [id number]) as of 01/09/2021 13:16  Ref. Range 01/09/2021 07:53 01/09/2021 08:55 01/09/2021 10:03 01/09/2021 11:15 01/09/2021 12:47  Glucose-Capillary Latest Ref Range: 70 - 99 mg/dL 70 71 70 100 (H) 160 (H)   Diabetes history: DM2 Outpatient Diabetes medications:  Levemir 12-20 units daily Jardiance 25 mg daily Metformin 1000 mg daily Current orders for Inpatient glycemic control:  Novolog 0-9 units TID Jardiance 25 mg daily Metfromin 1000 mg BID Decadron 10 mg @ 0045 on 01/09/21  Inpatient Diabetes Program Recommendations:     Referral for diabetes control received.  S/P right BKA this morning. Has a carb modified diet order.  Please consider:  -Discontinuing Metformin while inpatient -Levemir 12 units daily  Will continue to follow while inpatient.  Thank you, Reche Dixon, RN, BSN Diabetes Coordinator Inpatient Diabetes Program [phone] (team pager from 8a-5p)

## 2021-01-09 NOTE — Anesthesia Postprocedure Evaluation (Signed)
Anesthesia Post Note  Patient: Keith Espinoza  Procedure(s) Performed: REVISION RIGHT BELOW KNEE AMPUTATION (Right )     Patient location during evaluation: PACU Anesthesia Type: Regional and General Level of consciousness: awake and alert, oriented and patient cooperative Pain management: pain level controlled Vital Signs Assessment: post-procedure vital signs reviewed and stable Respiratory status: spontaneous breathing, nonlabored ventilation and respiratory function stable Cardiovascular status: blood pressure returned to baseline and stable Postop Assessment: no apparent nausea or vomiting Anesthetic complications: no   No complications documented.  Last Vitals:  Vitals:   01/09/21 1125 01/09/21 1140  BP: (!) 155/75 (!) 163/72  Pulse: 76 71  Resp: 13 13  Temp:    SpO2: 98% 99%    Last Pain:  Vitals:   01/09/21 1140  TempSrc:   PainSc: [name]

## 2021-01-09 NOTE — Anesthesia Procedure Notes (Signed)
Anesthesia Regional Block: Adductor canal block   Pre-Anesthetic Checklist: ,, timeout performed, Correct Patient, Correct Site, Correct Laterality, Correct Procedure, Correct Position, site marked, Risks and benefits discussed,  Surgical consent,  Pre-op evaluation,  At surgeon's request and post-op pain management  Laterality: Right  Prep: Maximum Sterile Barrier Precautions used, chloraprep       Needles:  Injection technique: Single-shot  Needle Type: Echogenic Stimulator Needle     Needle Length: 9cm  Needle Gauge: 22     Additional Needles:   Procedures:,,,, ultrasound used (permanent image in chart),,,,  Narrative:  Start time: 01/09/2021 9:45 AM End time: 01/09/2021 9:50 AM Injection made incrementally with aspirations every 5 mL.  Performed by: Personally  Anesthesiologist: Pervis Hocking, DO  Additional Notes: Monitors applied. No increased pain on injection. No increased resistance to injection. Injection made in 5cc increments. Good needle visualization. Patient tolerated procedure well.

## 2021-01-09 NOTE — Plan of Care (Signed)

## 2021-01-09 NOTE — Anesthesia Procedure Notes (Signed)
Anesthesia Regional Block: Popliteal block   Pre-Anesthetic Checklist: ,, timeout performed, Correct Patient, Correct Site, Correct Laterality, Correct Procedure, Correct Position, site marked, Risks and benefits discussed,  Surgical consent,  Pre-op evaluation,  At surgeon's request and post-op pain management  Laterality: Right  Prep: Maximum Sterile Barrier Precautions used, chloraprep       Needles:  Injection technique: Single-shot  Needle Type: Echogenic Stimulator Needle     Needle Length: 9cm  Needle Gauge: 22     Additional Needles:   Procedures:,,,, ultrasound used (permanent image in chart),,,,  Narrative:  Start time: 01/09/2021 9:40 AM End time: 01/09/2021 9:45 AM Injection made incrementally with aspirations every 5 mL.  Performed by: Personally  Anesthesiologist: Pervis Hocking, DO  Additional Notes: Monitors applied. No increased pain on injection. No increased resistance to injection. Injection made in 5cc increments. Good needle visualization. Patient tolerated procedure well.

## 2021-01-10 ENCOUNTER — Encounter (HOSPITAL_COMMUNITY): Payer: Self-pay | Admitting: Orthopedic Surgery

## 2021-01-10 LAB — GLUCOSE, CAPILLARY
Glucose-Capillary: 395 mg/dL — ABNORMAL HIGH (ref 70–99)
Glucose-Capillary: 376 mg/dL — ABNORMAL HIGH (ref 70–99)
Glucose-Capillary: 376 mg/dL — ABNORMAL HIGH (ref 70–99)
Glucose-Capillary: 174 mg/dL — ABNORMAL HIGH (ref 70–99)
Glucose-Capillary: 153 mg/dL — ABNORMAL HIGH (ref 70–99)
Glucose-Capillary: 211 mg/dL — ABNORMAL HIGH (ref 70–99)
Glucose-Capillary: 233 mg/dL — ABNORMAL HIGH (ref 70–99)
Glucose-Capillary: 288 mg/dL — ABNORMAL HIGH (ref 70–99)

## 2021-01-10 LAB — BASIC METABOLIC PANEL
Anion gap: 8 (ref 5–15)
BUN: 26 mg/dL — ABNORMAL HIGH (ref 8–23)
CO2: 31 mmol/L (ref 22–32)
Calcium: 9.3 mg/dL (ref 8.9–10.3)
Chloride: 94 mmol/L — ABNORMAL LOW (ref 98–111)
Creatinine, Ser: 1.09 mg/dL (ref 0.61–1.24)
GFR, Estimated: >60 mL/min (ref >60)
Glucose, Bld: 347 mg/dL — ABNORMAL HIGH (ref 70–99)
Potassium: 4.1 mmol/L (ref 3.5–5.1)
Sodium: 133 mmol/L — ABNORMAL LOW (ref 135–145)

## 2021-01-10 LAB — CBC
HCT: 30.8 % — ABNORMAL LOW (ref 39.0–52.0)
Hemoglobin: 10.6 g/dL — ABNORMAL LOW (ref 13.0–17.0)
MCH: 30.5 pg (ref 26.0–34.0)
MCHC: 34.4 g/dL (ref 30.0–36.0)
MCV: 88.5 fL (ref 80.0–100.0)
Platelets: 162 10*3/uL (ref 150–400)
RBC: 3.48 MIL/uL — ABNORMAL LOW (ref 4.22–5.81)
RDW: 14.6 % (ref 11.5–15.5)
WBC: 9.2 10*3/uL (ref 4.0–10.5)
nRBC: 0 % (ref 0.0–0.2)

## 2021-01-10 NOTE — TOC Initial Note (Signed)
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 PTA, pt independent with assistive devices; lives at home alone.  Pt states that he has intermittent assistance from his daughter and 2 granddaughters; wife is currently in a [employer].  Pt prefers to dc home with [facility] services; he has used [facility], and would like to use again.  No DME needs, per pt/therapies.                   Expected Discharge Plan: [place] Barriers to Discharge: Continued Medical Work up   Patient Goals and CMS Choice Patient states their goals for this hospitalization and ongoing recovery are:: to go home CMS Medicare.gov Compare Post Acute Care list provided to:: Patient Choice offered to / list presented to : Patient  Expected Discharge Plan and Services Expected Discharge Plan: [place]   Discharge Planning Services: CM Consult Post Acute Care Choice: [place] arrangements for the past 2 months: [place]: PT,OT [place] Agency: [place] ([place]) Date [place]: 01/10/21 Time [place]: [place] Representative spoke with at [place]: Corene Cornea  Prior Living Arrangements/Services Living arrangements for the past 2 months: [place] with:: Self Patient language and need for interpreter reviewed:: Yes Do you feel safe going back to the place where you live?: Yes      Need for Family Participation in Patient Care: Yes (Comment) Care giver support system in place?: Yes (comment)  (intermittently) Current home services: DME Criminal Activity/Legal Involvement Pertinent to Current Situation/Hospitalization: No - Comment as needed  Activities of Daily Living Home Assistive Devices/Equipment: Eyeglasses,Dentures (specify type),Wheelchair,Walker (specify type),CBG Meter,Blood pressure cuff ADL Screening (condition at time of admission) Patient's cognitive ability adequate to safely complete daily activities?: Yes Is the patient deaf or have difficulty hearing?: No Does the patient have difficulty seeing, even when wearing glasses/contacts?: No Does the patient have difficulty concentrating, remembering, or making decisions?: No Patient able to express need for assistance with ADLs?: Yes Does the patient have difficulty dressing or bathing?: Yes Independently performs ADLs?: No Communication: Independent Dressing (OT): Needs assistance Is this a change from baseline?: Pre-admission baseline Grooming: Needs assistance Is this a change from baseline?: Pre-admission baseline Feeding: Independent Bathing: Needs assistance Is this a change from baseline?: Pre-admission baseline Toileting: Needs assistance Is this a change from baseline?: Pre-admission baseline In/Out Bed: Needs assistance Is this a change from baseline?: Pre-admission baseline Walks in Home: Needs assistance Is this a change from baseline?: Pre-admission baseline Does the patient have difficulty walking or climbing stairs?: Yes Weakness of Legs: Right Weakness of Arms/Hands: None  Permission Sought/Granted   Permission granted to share information with : Yes, Verbal Permission Granted     Permission granted to share info w AGENCY: [place]        Emotional Assessment Appearance:: Appears stated age Attitude/Demeanor/Rapport: Engaged Affect (typically observed): Accepting Orientation: :  Oriented to Self,Oriented to Place,Oriented to  Time,Oriented to Situation      Admission  diagnosis:  Wound dehiscence [T81.30XA] Patient Active Problem List   Diagnosis Date Noted  . Wound dehiscence 01/09/2021  . Dehiscence of amputation stump ([place])   . Status post percutaneous transluminal coronary angioplasty 01/06/2021  . Type II diabetes mellitus, uncontrolled ([place]) 01/06/2021  . Acute pancreatitis 01/06/2021  . Diabetic peripheral vascular disease ([place]) 01/06/2021  . Encounter for screening for other disorder 01/06/2021  . Enlarged prostate 01/06/2021  . Foot ulcer, right ([place]) 01/06/2021  . Gout 01/06/2021  . Headache 01/06/2021  . Neck pain 01/06/2021  . Hypoglycemia 01/06/2021  . Loss of appetite 01/06/2021  . 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 Peripheral neuropathy 01/06/2021  . Sciatica 01/06/2021  . Vitamin B12 deficiency 01/06/2021  . Vitamin D deficiency 01/06/2021  . Weakness 01/06/2021  . Diabetes mellitus type 2 with neurological manifestations ([place]) 01/06/2021  . Protein-calorie malnutrition, severe 12/26/2020  . Acute blood loss anemia 12/26/2020  . Prerenal azotemia 12/26/2020  . Below-knee amputation of right lower extremity ([place]) 12/12/2020  . Gangrene of right foot ([place])   . Diabetic neuropathy ([place]) 11/17/2020  . Hyperglycemia due to type 2 diabetes mellitus ([place]) 11/17/2020  . Long term (current) use of insulin ([place]) 11/17/2020  . Obesity 11/17/2020  . 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/2019  . Hip dislocation, right ([place]) 04/13/2019  . Other intervertebral disc degeneration, lumbar region 03/30/2019  . CAD (coronary artery disease) 01/30/2019  . Tobacco abuse 01/30/2019  . 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  . Burn, foot, second degree, left, initial encounter 06/08/2017  . 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 S/P total knee arthroplasty, left 10/26/2016  . Hyperlipidemia 09/04/2014  . Thrombocytopenia ([place])   . Precordial chest pain 04/05/2014  . Coronary atherosclerosis of native  coronary artery 10/01/2013  . Other and unspecified hyperlipidemia 10/01/2013  . Essential hypertension, benign 10/01/2013  . Insulin dependent type 2 diabetes mellitus ([place]) 10/01/2013  . Esophageal reflux 10/01/2013  . Hypertrophy of prostate without urinary obstruction and other lower urinary tract symptoms (LUTS) 10/01/2013   PCP:  [name], MD Pharmacy:   [facility] [place], [place] - [address] [place] [address] [place] [address] [place] [address] Phone: [phone] Fax: [phone]     Social Determinants of Health (SDOH) Interventions    Readmission Risk Interventions No flowsheet data found.  Reinaldo Raddle, RN, BSN  Trauma/Neuro ICU Case Manager [phone]

## 2021-01-10 NOTE — Progress Notes (Addendum)
Inpatient Diabetes Program Recommendations  AACE/ADA: New Consensus Statement on Inpatient Glycemic Control (2015)  Target Ranges:  Prepandial:   less than 140 mg/dL      Peak postprandial:   less than 180 mg/dL (1-2 hours)      Critically ill patients:  140 - 180 mg/dL   Lab Results  Component Value Date   GLUCAP 376 (H) 01/10/2021   HGBA1C 7.7 (H) 12/12/2020    Review of Glycemic Control  Diabetes history: DM2 Outpatient Diabetes medications:  Levemir 12-20 units daily Jardiance 25 mg daily Metformin 1000 mg daily Current orders for Inpatient glycemic control:  Levemir 12 units Novolog 0-9 units TID Jardiance 25 mg daily Metformin 1000 mg BID Decadron 10 mg @ 0950 on 01/09/21  Inpatient Diabetes Program Recommendations:   Please consider: -Increase Levemir to 18 units daily -Add Novolog 3 units tid meal coverage if eats 50%  Thank you, Bethena Roys E. Weldon Nouri, RN, MSN, CDE  Diabetes Coordinator Inpatient Glycemic Control Team Team Pager [phone] (8am-5pm) 01/10/2021 9:11 AM

## 2021-01-10 NOTE — Evaluation (Signed)
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  Clinical Impression  Patient is bear baseline mobility. He reports at home he was mostly spending time in his wheelchair. He would like to get home 2nd to his wife being sick. He was able to stand and ambulate 10' safely. He is safe with transfers. Skilled therapy will continue to follow while admitted. He would benefit from home health PT upon discharge.     Follow Up Recommendations Home health PT    Equipment Recommendations  None recommended by PT    Recommendations for Other Services       Precautions / Restrictions Precautions Precautions: Fall Precaution Comments: R BKA Restrictions Weight Bearing Restrictions: Yes RLE Weight Bearing: Non weight bearing      Mobility  Bed Mobility Overal bed mobility: Modified Independent             General bed mobility comments: Patient found in chair. Per OPT note patient mod I with bed mobility    Transfers Overall transfer level: Needs assistance   Transfers: Squat Pivot Transfers     Squat pivot transfers: Supervision     General transfer comment: sit to stand with supervision for intial balance  Ambulation/Gait Ambulation/Gait assistance: Supervision Gait Distance (Feet): 10 Feet Assistive device: Rolling walker (2 wheeled) Gait Pattern/deviations: WFL(Within Functional Limits) Gait velocity: decreased   General Gait Details: able to hop forward with good stability.  Stairs            Wheelchair Mobility    Modified Rankin (Stroke Patients Only)       Balance Overall balance assessment: Needs assistance Sitting-balance support: No upper extremity supported;Feet supported Sitting  balance-Leahy Scale: Good     Standing balance support: Bilateral upper extremity supported Standing balance-Leahy Scale: Poor                               Pertinent Vitals/Pain Pain Assessment: No/denies pain    Home Living Family/patient expects to be discharged to:: Private residence Living Arrangements: Children;Other relatives (wife is in [place]) Available Help at Discharge: Family;Friend(s);Available PRN/intermittently Type of Home: House Home Access: Level entry     Home Layout: One level (pt lives in basement) Home Equipment: Walker - 2 wheels;Cane - single point;Bedside commode;Shower seat;Wheelchair - manual;Wheelchair - power;Grab bars - toilet;Grab bars - tub/shower Additional Comments: Spouse is on hospice    Prior Function Level of Independence: Independent with assistive device(s)         Comments: wc level     Hand Dominance   Dominant Hand: Right    Extremity/Trunk Assessment   Upper Extremity Assessment Upper Extremity Assessment: Overall WFL for tasks assessed    Lower Extremity Assessment Lower Extremity Assessment: RLE deficits/detail RLE Deficits / Details: has movement of right residual limb with minor pain    Cervical / Trunk Assessment Cervical / Trunk Assessment: Normal  Communication   Communication: No difficulties  Cognition Arousal/Alertness: Awake/alert Behavior During Therapy: WFL for tasks assessed/performed Overall Cognitive Status: No family/caregiver present to determine baseline cognitive functioning  General Comments: most likely at baseline      General Comments General comments (skin integrity, edema, etc.): wound vac on residual limb    Exercises     Assessment/Plan    PT Assessment Patient needs continued PT services  PT Problem List Decreased strength;Decreased range of motion;Decreased activity tolerance;Decreased mobility;Decreased  balance;Pain       PT Treatment Interventions DME instruction;Gait training;Stair training;Functional mobility training;Therapeutic activities;Therapeutic exercise;Balance training;Neuromuscular re-education;Patient/family education    PT Goals (Current goals can be found in the Care Plan section)  Acute Rehab PT Goals Patient Stated Goal: to go home and see his wife PT Goal Formulation: With patient Time For Goal Achievement:  (w+1) Potential to Achieve Goals: Good    Frequency Min 2X/week   Barriers to discharge        Co-evaluation               AM-PAC PT "6 Clicks" Mobility  Outcome Measure Help needed turning from your back to your side while in a flat bed without using bedrails?: A Little Help needed moving from lying on your back to sitting on the side of a flat bed without using bedrails?: A Little Help needed moving to and from a bed to a chair (including a wheelchair)?: A Little Help needed standing up from a chair using your arms (e.g., wheelchair or bedside chair)?: A Little Help needed to walk in hospital room?: A Little Help needed climbing 3-5 steps with a railing? : A Little 6 Click Score: 18    End of Session Equipment Utilized During Treatment: Gait belt Activity Tolerance: Patient tolerated treatment well Patient left: in chair;with call bell/phone within reach;with chair alarm set Nurse Communication: Mobility status PT Visit Diagnosis: Unsteadiness on feet (R26.81);Other abnormalities of gait and mobility (R26.89);Pain Pain - Right/Left: Right    Time: 3888-2800 PT Time Calculation (min) (ACUTE ONLY): 18 min   Charges:   PT Evaluation $PT Eval Moderate Complexity: 1 Mod            Carney Living PT DPT  01/10/2021, 3:36 PM

## 2021-01-10 NOTE — Progress Notes (Addendum)
Pt's CBG at 2027- 395. Levemir 12 units  is administered. CBG at 2352 - 352.

## 2021-01-10 NOTE — Progress Notes (Signed)
Inpatient Rehab Admissions Coordinator:   Both PT and OT are recommending HH. CIR will sign off.   Clemens Catholic, [place], [place] Admissions Coordinator  [phone] ([place]) [phone] (office)

## 2021-01-10 NOTE — Progress Notes (Signed)
Patient ID: Keith Espinoza, male   DOB: 11-07-1940, 80 y.o.   MRN: [id number] Patient is postoperative day 1 revision transtibial amputation.  There is no drainage in the wound VAC canister.  Patient has no complaints of pain.  Plan for discharge on Monday.

## 2021-01-10 NOTE — Progress Notes (Signed)
Inpatient Rehab Admissions Coordinator:   I met with Pt. At bedside to discuss potential CIR admit. Pt. Is adamant that he wants to go home with [facility] PT/OT. PT eval is pending, but OT note recommended [place] OT. If Pt. Does well with PT,  He can likely go home with [facility]  Clemens Catholic, [place], [place] Admissions Coordinator  [phone] ([place]) [phone] (office)

## 2021-01-10 NOTE — Plan of Care (Signed)
  Problem: Activity: Goal: Risk for activity intolerance will decrease Outcome: Progressing   Problem: Pain Managment: Goal: General experience of comfort will improve Outcome: Progressing   Problem: Safety: Goal: Ability to remain free from injury will improve Outcome: Progressing   

## 2021-01-10 NOTE — Progress Notes (Signed)
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   Clinical Impression   PTA pt lives alone and functions @ wc level since his BKA 3/33. Pt's wife is in Hospice care and his daughter/grand daughter help him as needed. Pt is active with [place] OT/PT services. Pt requires minguard A for squat pivot transfers and min A for LB ADL. Recommend DC home with initial S with transfers and ADL tasks. Pt plans to continue with  HHOT/PT after DC home. Will follow acutely.     Follow Up Recommendations  Home health OT;Other (comment) (S initially for transfers adn ADL tasks)    Equipment Recommendations  None recommended by OT    Recommendations for Other Services       Precautions / Restrictions Precautions Precautions: Fall Precaution Comments: R BKA Restrictions Weight Bearing Restrictions: Yes RLE Weight Bearing: Non weight bearing      Mobility Bed Mobility Overal bed mobility: Modified Independent                  Transfers Overall transfer level: Needs assistance   Transfers: Squat Pivot Transfers     Squat pivot transfers: Min guard     General transfer comment: pt able to direct needed set up    Balance                                           ADL either performed or assessed with clinical judgement   ADL Overall ADL's : Needs assistance/impaired     Grooming: Set up;Sitting   Upper Body Bathing: Set up;Sitting   Lower Body Bathing: Minimal assistance;Sitting/lateral leans   Upper Body Dressing : Set up;Sitting   Lower Body Dressing: Minimal assistance;Sitting/lateral leans   Toilet Transfer: Min guard;Squat-pivot   Toileting- Clothing Manipulation and Hygiene: Set  up;Sitting/lateral lean;Supervision/safety       Functional mobility during ADLs: Min guard;Wheelchair (squat pivot)       Vision         Perception     Praxis      Pertinent Vitals/Pain Pain Assessment: No/denies pain     Hand Dominance Right   Extremity/Trunk Assessment Upper Extremity Assessment Upper Extremity Assessment: Overall WFL for tasks assessed   Lower Extremity Assessment Lower Extremity Assessment: Defer to PT evaluation   Cervical / Trunk Assessment Cervical / Trunk Assessment: Normal   Communication Communication Communication: No difficulties   Cognition Arousal/Alertness: Awake/alert Behavior During Therapy: WFL for tasks assessed/performed Overall Cognitive Status: No family/caregiver present to determine baseline cognitive functioning                                 General Comments: most likely at baseline   General Comments  encouraged positioning for terminal knee extension    Exercises     Shoulder Instructions      Home Living Family/patient expects to be discharged to:: Private residence Living Arrangements: Children;Other relatives (wife is in [place]) Available Help at Discharge: Family;Friend(s);Available PRN/intermittently Type of Home: House Home Access: Level entry     Home Layout: One level (pt lives  in basement)     Bathroom Shower/Tub: Occupational psychologist: Standard Bathroom Accessibility: Yes How Accessible: Accessible via wheelchair;Accessible via walker Home Equipment: [place] - 2 wheels;Cane - single point;Bedside commode;Shower seat;Wheelchair - manual;Wheelchair - power;Grab bars - toilet;Grab bars - tub/shower          Prior Functioning/Environment Level of Independence: Independent with assistive device(s)        Comments: wc level        OT Problem List: Decreased strength;Decreased range of motion;Impaired balance (sitting and/or standing);Decreased knowledge of  use of DME or AE      OT Treatment/Interventions: Self-care/ADL training;Therapeutic exercise;DME and/or AE instruction;Therapeutic activities;Patient/family education;Balance training    OT Goals(Current goals can be found in the care plan section) Acute Rehab OT Goals Patient Stated Goal: to go home and see his wife OT Goal Formulation: With patient Time For Goal Achievement: 01/24/21 Potential to Achieve Goals: Good  OT Frequency: Min 2X/week   Barriers to D/C:            Co-evaluation              AM-PAC OT "6 Clicks" Daily Activity     Outcome Measure Help from another person eating meals?: None Help from another person taking care of personal grooming?: A Little Help from another person toileting, which includes using toliet, bedpan, or urinal?: A Little Help from another person bathing (including washing, rinsing, drying)?: A Little Help from another person to put on and taking off regular upper body clothing?: A Little Help from another person to put on and taking off regular lower body clothing?: A Little 6 Click Score: 19   End of Session Equipment Utilized During Treatment: Gait belt Nurse Communication: Mobility status  Activity Tolerance: Patient tolerated treatment well Patient left: in chair;with call bell/phone within reach;with chair alarm set  OT Visit Diagnosis: Other abnormalities of gait and mobility (R26.89);Muscle weakness (generalized) (M62.81)                Time: 7902-4097 OT Time Calculation (min): 17 min Charges:  OT General Charges $OT Visit: 1 Visit OT Evaluation $OT Eval Low Complexity: [place], OT/L   Acute OT Clinical Specialist [place] Pager [phone] Office [phone]   [facility] 01/10/2021, 1:08 PM

## 2021-01-11 LAB — GLUCOSE, CAPILLARY
Glucose-Capillary: 269 mg/dL — ABNORMAL HIGH (ref 70–99)
Glucose-Capillary: 194 mg/dL — ABNORMAL HIGH (ref 70–99)
Glucose-Capillary: 279 mg/dL — ABNORMAL HIGH (ref 70–99)
Glucose-Capillary: 342 mg/dL — ABNORMAL HIGH (ref 70–99)

## 2021-01-11 LAB — BASIC METABOLIC PANEL
Anion gap: 9 (ref 5–15)
BUN: 22 mg/dL (ref 8–23)
CO2: 26 mmol/L (ref 22–32)
Calcium: 9.1 mg/dL (ref 8.9–10.3)
Chloride: 100 mmol/L (ref 98–111)
Creatinine, Ser: 1.02 mg/dL (ref 0.61–1.24)
GFR, Estimated: >60 mL/min (ref >60)
Glucose, Bld: 292 mg/dL — ABNORMAL HIGH (ref 70–99)
Potassium: 3.2 mmol/L — ABNORMAL LOW (ref 3.5–5.1)
Sodium: 135 mmol/L (ref 135–145)

## 2021-01-11 LAB — CBC
HCT: 32.5 % — ABNORMAL LOW (ref 39.0–52.0)
Hemoglobin: 11 g/dL — ABNORMAL LOW (ref 13.0–17.0)
MCH: 30.1 pg (ref 26.0–34.0)
MCHC: 33.8 g/dL (ref 30.0–36.0)
MCV: 89 fL (ref 80.0–100.0)
Platelets: 135 10*3/uL — ABNORMAL LOW (ref 150–400)
RBC: 3.65 MIL/uL — ABNORMAL LOW (ref 4.22–5.81)
RDW: 14.6 % (ref 11.5–15.5)
WBC: 7.7 10*3/uL (ref 4.0–10.5)
nRBC: 0 % (ref 0.0–0.2)

## 2021-01-11 MED ORDER — LORAZEPAM 1 MG PO TABS
1.0000 mg | ORAL_TABLET | Freq: Four times a day (QID) | ORAL | Status: DC | PRN
  Administered 2021-01-11: 1 mg via ORAL
  Filled 2021-01-11: qty 1

## 2021-01-11 MED ORDER — OXYCODONE HCL 5 MG PO TABS
5.0000 mg | ORAL_TABLET | ORAL | Status: DC | PRN
  Administered 2021-01-11: 10 mg via ORAL
  Administered 2021-01-11: 15 mg via ORAL
  Filled 2021-01-11: qty 2
  Filled 2021-01-11: qty 3

## 2021-01-11 NOTE — Progress Notes (Signed)
Patient ID: Keith Espinoza, male   DOB: 1941/07/19, 80 y.o.   MRN: [id number] The VAC over the patient's right BKA stump is intact with a good seal.  He states that he has not been sleeping well like to go home today.  He is getting Percocet here for pain but he says just regular oxycodone which he is on at home works better.  I let him know that we would switch him to just regular oxycodone today with the goal of discharging to home tomorrow.

## 2021-01-11 NOTE — Plan of Care (Signed)
  Problem: Education: Goal: Knowledge of General Education information will improve Description Including pain rating scale, medication(s)/side effects and non-pharmacologic comfort measures Outcome: Progressing   Problem: Health Behavior/Discharge Planning: Goal: Ability to manage health-related needs will improve Outcome: Progressing   

## 2021-01-12 ENCOUNTER — Telehealth: Payer: Self-pay | Admitting: Orthopedic Surgery

## 2021-01-12 ENCOUNTER — Other Ambulatory Visit: Payer: Self-pay

## 2021-01-12 ENCOUNTER — Emergency Department (HOSPITAL_BASED_OUTPATIENT_CLINIC_OR_DEPARTMENT_OTHER)
Admission: EM | Admit: 2021-01-12 | Discharge: 2021-01-12 | Disposition: A | Discharge status: Home / Self Care | Payer: Medicare Other | Attending: Emergency Medicine | Admitting: Emergency Medicine

## 2021-01-12 ENCOUNTER — Encounter (HOSPITAL_BASED_OUTPATIENT_CLINIC_OR_DEPARTMENT_OTHER): Payer: Self-pay

## 2021-01-12 DIAGNOSIS — Z8616 Personal history of COVID-19: Secondary | ICD-10-CM | POA: Diagnosis not present

## 2021-01-12 DIAGNOSIS — E119 Type 2 diabetes mellitus without complications: Secondary | ICD-10-CM

## 2021-01-12 DIAGNOSIS — Z96652 Presence of left artificial knee joint: Secondary | ICD-10-CM | POA: Diagnosis not present

## 2021-01-12 DIAGNOSIS — Z4800 Encounter for change or removal of nonsurgical wound dressing: Secondary | ICD-10-CM | POA: Insufficient documentation

## 2021-01-12 DIAGNOSIS — I2510 Atherosclerotic heart disease of native coronary artery without angina pectoris: Secondary | ICD-10-CM | POA: Diagnosis not present

## 2021-01-12 DIAGNOSIS — I10 Essential (primary) hypertension: Secondary | ICD-10-CM | POA: Insufficient documentation

## 2021-01-12 DIAGNOSIS — Z79899 Other long term (current) drug therapy: Secondary | ICD-10-CM | POA: Insufficient documentation

## 2021-01-12 DIAGNOSIS — S88111A Complete traumatic amputation at level between knee and ankle, right lower leg, initial encounter: Secondary | ICD-10-CM

## 2021-01-12 DIAGNOSIS — Z7984 Long term (current) use of oral hypoglycemic drugs: Secondary | ICD-10-CM | POA: Insufficient documentation

## 2021-01-12 DIAGNOSIS — Z96641 Presence of right artificial hip joint: Secondary | ICD-10-CM | POA: Diagnosis not present

## 2021-01-12 DIAGNOSIS — Z794 Long term (current) use of insulin: Secondary | ICD-10-CM | POA: Insufficient documentation

## 2021-01-12 DIAGNOSIS — Z85828 Personal history of other malignant neoplasm of skin: Secondary | ICD-10-CM | POA: Insufficient documentation

## 2021-01-12 DIAGNOSIS — F17220 Nicotine dependence, chewing tobacco, uncomplicated: Secondary | ICD-10-CM | POA: Insufficient documentation

## 2021-01-12 DIAGNOSIS — E1140 Type 2 diabetes mellitus with diabetic neuropathy, unspecified: Secondary | ICD-10-CM | POA: Diagnosis not present

## 2021-01-12 DIAGNOSIS — Z7902 Long term (current) use of antithrombotics/antiplatelets: Secondary | ICD-10-CM | POA: Insufficient documentation

## 2021-01-12 DIAGNOSIS — Z7982 Long term (current) use of aspirin: Secondary | ICD-10-CM | POA: Insufficient documentation

## 2021-01-12 DIAGNOSIS — Z4689 Encounter for fitting and adjustment of other specified devices: Secondary | ICD-10-CM

## 2021-01-12 LAB — GLUCOSE, CAPILLARY: Glucose-Capillary: 310 mg/dL — ABNORMAL HIGH (ref 70–99)

## 2021-01-12 MED ORDER — OXYCODONE HCL 5 MG PO TABS
5.0000 mg | ORAL_TABLET | ORAL | Status: AC
  Administered 2021-01-12: 5 mg via ORAL
  Filled 2021-01-12: qty 1

## 2021-01-12 MED ORDER — OXYCODONE HCL 5 MG PO TABS: 5.0000 mg | ORAL_TABLET | ORAL | 0 refills | Status: DC | PRN

## 2021-01-12 NOTE — ED Notes (Signed)
This RN, MD, and primary RN went in to assess patient's wound vac. The device was beeping and full with drainage and dressing is "squishy" and not adhering to skin. Vacuum seal chamber on device emptied of secretions and attempted to be reapplied, but no suction from wound to device noted. MD aware.

## 2021-01-12 NOTE — Telephone Encounter (Signed)
Pt is s/p a revision of BKA 01/09/2021. Was he supposed to be taking an ABX after surgery? Pt is calling and asking for a refill. Please advise.

## 2021-01-12 NOTE — ED Triage Notes (Signed)
Pt here POV from Home with Daughter with Post-Operative Complication.  Patient had a recent BKA of the R. Leg on Friday and was discharged today with a Wound-Vac in place.   Patient has plans to see wound-specialist next week but today the Wound-Vac is full and patient was not instructed further.  In wheelchair, GCS 15.

## 2021-01-12 NOTE — TOC Transition Note (Signed)
Transition of Care [facility]) - CM/SW Discharge Note   Patient Details  Name: Keith Espinoza MRN: [id number] Date of Birth: 1941/01/22  [facility] of [facility] CM/SW Contact:  Milinda Antis, [place] Phone Number: 01/12/2021, 10:55 AM   Clinical Narrative:     Patient will DC to: Home Anticipated DC date: 4.11.2022 Family notified: yes  Transport by:  Family   Per MD patient ready for [place] . RN to call report prior to discharge (). RN, patient, patient's family, and [place] agency made aware of DC. Discharge Summary given to patient. DC packet on chart.    CSW will sign off for now as social work intervention is no longer needed. Please consult [place] again if new needs arise.     Final next level of care: [place] Barriers to Discharge: No Barriers Identified   Patient Goals and CMS Choice Patient states their goals for this hospitalization and ongoing recovery are:: to go home CMS Medicare.gov Compare Post Acute Care list provided to:: Patient Choice offered to / list presented to : Patient  Discharge Placement                       Discharge Plan and Services   Discharge Planning Services: CM Consult Post Acute Care Choice: [place]: PT,OT [place] Agency: [place] ([facility]) Date [place]: 01/10/21 Time [place]: [place] Representative spoke with at [place]: [place] ([place]) Interventions     Readmission Risk Interventions No flowsheet data found.

## 2021-01-12 NOTE — Telephone Encounter (Signed)
I called and sw pt and his daughter and advised. Will call with any other questions.

## 2021-01-12 NOTE — ED Provider Notes (Signed)
[facility] EMERGENCY DEPT Provider Note   CSN: [id number] Arrival date & time: 01/12/21  2037     History Chief Complaint  Patient presents with  . Post-op Problem    Wound-Vac    Keith Espinoza is a 80 y.o. male.  Pt presents to the ED today with an wound vac problem.  Pt had dehiscence of his right bka stump and had surgery on 4/8 to repair that.  He was discharged today with a wound vac.  The wound vac reservoir was full.  Pt's daughter said she tried to get ahold of Dr. Sharol Given, but was unable to do so.    Complicating matters is that pt's wife had a cardiac arrest on 4/9 and the funeral is tomorrow.        Past Medical History:  Diagnosis Date  . Allergic rhinitis   . Allergic rhinitis   . Arthritis   . Basal cell carcinoma 11/01/2019    bcc left chest treatment TX cx3 72fu   . Chronic leg pain    right  . Chronic lower back pain   . Coronary artery disease    a. Stenting to RCA 2004; staged DES to LAD and Cx 2004. DES to mRCA 2012. b. DES to mCx, PTCA to dCx 11/2011. c. Lateral wall MI 2013 s/p PTCA to distal Cx & DES to mid OM2 11/2011. d. Low risk nuc 04/2014, EF wnl.  Marland Kitchen COVID-19   . Diabetes mellitus    Insulin dependent  . Diabetic neuropathy (HCC)    MILD  . Diverticulosis   . Dysrhythmia   . Gilbert syndrome   . Gout    right wrist; right foot; right elbow; have had it since 1970's  . H/O hiatal hernia   . Heart murmur   . History of echocardiogram    aortic sclerosis per echo 12/09 EF 65%, otherwise normal  . History of hemorrhoids    BLEEDING  . History of kidney stones    h/o  . Hypertension    Diagnosed 1995   . Myocardial infarction ([place])   . Pancreatic pseudocyst    a. s/p remote drainage 2006.  Marland Kitchen Thrombocytopenia ([place])    Seen on oldest labs in system from 2004  . Vitamin B 12 deficiency    orally replaced    Patient Active Problem List   Diagnosis Date Noted  . Wound dehiscence 01/09/2021  . Dehiscence of amputation  stump ([place])   . Status post percutaneous transluminal coronary angioplasty 01/06/2021  . Type II diabetes mellitus, uncontrolled ([place]) 01/06/2021  . Acute pancreatitis 01/06/2021  . Diabetic peripheral vascular disease ([place]) 01/06/2021  . Encounter for screening for other disorder 01/06/2021  . Enlarged prostate 01/06/2021  . Foot ulcer, right ([place]) 01/06/2021  . Gout 01/06/2021  . Headache 01/06/2021  . Neck pain 01/06/2021  . Hypoglycemia 01/06/2021  . Loss of appetite 01/06/2021  . 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 Peripheral neuropathy 01/06/2021  . Sciatica 01/06/2021  . Vitamin B12 deficiency 01/06/2021  . Vitamin D deficiency 01/06/2021  . Weakness 01/06/2021  . Diabetes mellitus type 2 with neurological manifestations ([place]) 01/06/2021  . Protein-calorie malnutrition, severe 12/26/2020  . Acute blood loss anemia 12/26/2020  . Prerenal azotemia 12/26/2020  . Below-knee amputation of right lower extremity ([place]) 12/12/2020  . Gangrene of right foot ([place])   . Diabetic neuropathy ([place]) 11/17/2020  . Hyperglycemia due to type 2 diabetes mellitus ([place]) 11/17/2020  . Long term (current) use of  insulin ([place]) 11/17/2020  . Obesity 11/17/2020  . 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/2019  . Hip dislocation, right ([place]) 04/13/2019  . Other intervertebral disc degeneration, lumbar region 03/30/2019  . CAD (coronary artery disease) 01/30/2019  . Tobacco abuse 01/30/2019  . 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  . Burn, foot, second degree, left, initial encounter 06/08/2017  . 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 S/P total knee arthroplasty, left 10/26/2016  . Hyperlipidemia 09/04/2014  . Thrombocytopenia ([place])   . Precordial chest pain 04/05/2014  . Coronary atherosclerosis of native coronary artery 10/01/2013  . Other and unspecified hyperlipidemia 10/01/2013  . Essential hypertension, benign 10/01/2013  . Insulin dependent type 2  diabetes mellitus ([place]) 10/01/2013  . Esophageal reflux 10/01/2013  . Hypertrophy of prostate without urinary obstruction and other lower urinary tract symptoms (LUTS) 10/01/2013    Past Surgical History:  Procedure Laterality Date  . ABDOMINAL AORTOGRAM W/LOWER EXTREMITY Bilateral 08/08/2020   Procedure: ABDOMINAL AORTOGRAM W/LOWER EXTREMITY;  Surgeon: Angelia Mould, MD;  Location: [facility];  Service: Cardiovascular;  Laterality: Bilateral;  . AMPUTATION Right 12/12/2020   Procedure: RIGHT BELOW KNEE AMPUTATION;  Surgeon: [name], MD;  Location: [facility];  Service: Orthopedics;  Laterality: Right;  . BACK SURGERY     "total of 3 times" S/P fall   . CARPAL TUNNEL RELEASE Bilateral   . CHOLECYSTECTOMY  1990's  . COLONOSCOPY    . CORONARY ANGIOPLASTY  11/11/11  . CORONARY ANGIOPLASTY WITH STENT PLACEMENT  09/30/2011   "1 then; makes a total of 4"  . CORONARY ANGIOPLASTY WITH STENT PLACEMENT  11/11/11   "1; makes a total of 5"  . INGUINAL HERNIA REPAIR  2003   right  . JOINT REPLACEMENT Right 04/03/2002   hip replacment  . KNEE ARTHROSCOPY  1990's   left  . LEFT HEART CATHETERIZATION WITH CORONARY ANGIOGRAM N/A 09/30/2011   Procedure: LEFT HEART CATHETERIZATION WITH CORONARY ANGIOGRAM;  Surgeon: Jettie Booze, MD;  Location[facility] CATH LAB;  Service: Cardiovascular;  Laterality: N/A;  possible PCI  . LEFT HEART CATHETERIZATION WITH CORONARY ANGIOGRAM N/A 11/15/2011   Procedure: LEFT HEART CATHETERIZATION WITH CORONARY ANGIOGRAM;  Surgeon: Jettie Booze, MD;  Location: [facility] CATH LAB;  Service: Cardiovascular;  Laterality: N/A;  . PERCUTANEOUS CORONARY STENT INTERVENTION (PCI-S)  09/30/2011   Procedure: PERCUTANEOUS CORONARY STENT INTERVENTION (PCI-S);  Surgeon: Jettie Booze, MD;  Location: [facility] CATH LAB;  Service: Cardiovascular;;  . PERCUTANEOUS CORONARY STENT INTERVENTION (PCI-S) N/A 11/11/2011   Procedure: PERCUTANEOUS CORONARY STENT INTERVENTION (PCI-S);   Surgeon: Jettie Booze, MD;  Location: [facility] CATH LAB;  Service: Cardiovascular;  Laterality: N/A;  . PERIPHERAL VASCULAR BALLOON ANGIOPLASTY Right 08/08/2020   Procedure: PERIPHERAL VASCULAR BALLOON ANGIOPLASTY;  Surgeon: Angelia Mould, MD;  Location: [facility] CV LAB;  Service: Cardiovascular;  Laterality: Right;  Posterior tibial   . SHOULDER SURGERY Right    X 2  . STUMP REVISION Right 01/09/2021   Procedure: REVISION RIGHT BELOW KNEE AMPUTATION;  Surgeon: [name], MD;  Location: [facility];  Service: Orthopedics;  Laterality: Right;  . TONSILLECTOMY  ~ 1948  . TOTAL HIP REVISION Right 04/13/2019   Procedure: RIGHT TOTAL HIP REVISION-POSTERIOR  APPROACH LATERAL;  Surgeon: Marybelle Killings, MD;  Location: [facility];  Service: Orthopedics;  Laterality: Right;  . TOTAL KNEE ARTHROPLASTY Left 07/23/2016   Procedure: LEFT TOTAL KNEE ARTHROPLASTY;  Surgeon: Marybelle Killings, MD;  Location[facility]  OR;  Service: Orthopedics;  Laterality: Left;       Family History  Problem Relation Age of Onset  . Diabetes Mother   . Hyperlipidemia Mother   . Hypertension Mother   . Cancer Father   . Hypertension Father   . Diabetes Sister   . Hypertension Sister   . Cancer Brother   . Heart attack Neg Hx     Social History   Tobacco Use  . Smoking status: Former Research scientist (life sciences)  . Smokeless tobacco: Current User    Types: Chew  . Tobacco comment: quit 60 years ago  Vaping Use  . Vaping Use: Never used  Substance Use Topics  . Alcohol use: No  . Drug use: No    Home Medications Prior to Admission medications   Medication Sig Start Date End Date Taking? Authorizing Provider  acetaminophen (TYLENOL) 325 MG tablet Take 1-2 tablets (325-650 mg total) by mouth every 4 (four) hours as needed for mild pain. Patient not taking: No sig reported 12/26/20   Bary Leriche, PA-C  acetaminophen (TYLENOL) 500 MG tablet Take 1,000 mg by mouth every 6 (six) hours as needed (pain).    [provider]   allopurinol (ZYLOPRIM) 300 MG tablet Take 300 mg by mouth daily.    [provider]  amLODipine (NORVASC) 5 MG tablet Take 1 tablet (5 mg total) by mouth daily. 12/26/20   LoveIvan Anchors, PA-C  aspirin EC 81 MG tablet Take 81 mg by mouth daily.    [provider]  Cholecalciferol (VITAMIN D3) 50 MCG (2000 UT) TABS Take 2,000 Units by mouth daily.     [provider]  clopidogrel (PLAVIX) 75 MG tablet Take 1 tablet (75 mg total) by mouth daily. 12/26/20   Love, Ivan Anchors, PA-C  Cyanocobalamin (B-12) 2500 MCG TABS Take 2,500 mcg by mouth daily.    [provider]  doxycycline (VIBRA-TABS) 100 MG tablet Take 1 tablet (100 mg total) by mouth every 12 (twelve) hours. 12/30/20   [name], MD  empagliflozin (JARDIANCE) 25 MG TABS tablet Take 25 mg by mouth daily.    [provider]  gabapentin (NEURONTIN) 100 MG capsule Take 2 capsules (200 mg total) by mouth at bedtime. 12/26/20   Love, Ivan Anchors, PA-C  gabapentin (NEURONTIN) 400 MG capsule Take 1 capsule (400 mg total) by mouth 3 (three) times daily. 12/26/20   Love, Ivan Anchors, PA-C  hydrochlorothiazide (HYDRODIURIL) 12.5 MG tablet Take 1 tablet (12.5 mg total) by mouth daily. 12/26/20   Love, Ivan Anchors, PA-C  insulin detemir (LEVEMIR FLEXTOUCH) 100 UNIT/ML FlexPen Inject 8-18 Units into the skin at bedtime. Dose is based on CBG    [provider]  isosorbide mononitrate (IMDUR) 30 MG 24 hr tablet Take 1 tablet (30 mg total) by mouth daily. Please schedule yearly appointment for future refills. Thank you 12/26/20   Love, Ivan Anchors, PA-C  losartan (COZAAR) 50 MG tablet Take 1 tablet (50 mg total) by mouth daily. 12/26/20   Love, Ivan Anchors, PA-C  metFORMIN (GLUCOPHAGE) 1000 MG tablet Take 1 tablet (1,000 mg total) by mouth 2 (two) times daily. 11/13/11   Jettie Booze, MD  methocarbamol (ROBAXIN) 500 MG tablet Take 1 tablet (500 mg total) by mouth every 6 (six) hours as needed for muscle spasms. 12/26/20    Love, Ivan Anchors, PA-C  nitroGLYCERIN (NITROSTAT) 0.4 MG SL tablet Place 0.4 mg under the tongue every 5 (five) minutes x 3 doses as  needed for chest pain.     [provider]  ONE TOUCH ULTRA TEST test strip CHECK BLOOD SUGAR ONCE DAILY AS DIRECTED 09/23/17   [provider]  oxyCODONE (OXY IR/ROXICODONE) 5 MG immediate release tablet Take 1 tablet (5 mg total) by mouth every 4 (four) hours as needed for severe pain. 01/12/21   [name], MD  terazosin (HYTRIN) 5 MG capsule Take 1 capsule (5 mg total) by mouth at bedtime. 12/26/20   Love, Ivan Anchors, PA-C  traMADol (ULTRAM) 50 MG tablet Take 1 tablet (50 mg total) by mouth every 6 (six) hours as needed for moderate pain. 12/26/20   Love, Ivan Anchors, PA-C  traZODone (DESYREL) 50 MG tablet Take 0.5-1 tablets (25-50 mg total) by mouth at bedtime as needed for sleep. Patient taking differently: Take 50 mg by mouth at bedtime as needed for sleep. 12/26/20   Love, Ivan Anchors, PA-C    Allergies    Simvastatin, Zetia [ezetimibe], and Dilaudid [hydromorphone hcl]  Review of Systems   Review of Systems  Musculoskeletal:       Wound vac not working  All other systems reviewed and are negative.   Physical Exam Updated Vital Signs BP 135/76 (BP Location: Right Arm)   Pulse 89   Temp 98.3 F (36.8 C) (Oral)   Resp 16   Ht 5\' 9"  (1.753 m)   Wt 70.3 kg   SpO2 100%   BMI 22.89 kg/m   Physical Exam Vitals and nursing note reviewed.  Constitutional:      Appearance: Normal appearance.  HENT:     Head: Normocephalic and atraumatic.     Right Ear: External ear normal.     Left Ear: External ear normal.     Nose: Nose normal.     Mouth/Throat:     Mouth: Mucous membranes are moist.     Pharynx: Oropharynx is clear.  Eyes:     Extraocular Movements: Extraocular movements intact.     Conjunctiva/sclera: Conjunctivae normal.     Pupils: Pupils are equal, round, and reactive to light.  Cardiovascular:     Rate and Rhythm: Normal  rate and regular rhythm.     Pulses: Normal pulses.     Heart sounds: Normal heart sounds.  Pulmonary:     Effort: Pulmonary effort is normal.     Breath sounds: Normal breath sounds.  Abdominal:     General: Abdomen is flat. Bowel sounds are normal.     Palpations: Abdomen is soft.  Musculoskeletal:     Cervical back: Normal range of motion and neck supple.     Comments: Right bka dressed with wound vac  Skin:    General: Skin is warm.     Capillary Refill: Capillary refill takes less than 2 seconds.  Neurological:     Mental Status: He is alert.     ED Results / Procedures / Treatments   Labs (all labs ordered are listed, but only abnormal results are displayed) Labs Reviewed - No data to display  EKG None  Radiology No results found.  Procedures Procedures   Medications Ordered in ED Medications  oxyCODONE (Oxy IR/ROXICODONE) immediate release tablet 5 mg (5 mg Oral Given 01/12/21 2156)    ED Course  I have reviewed the triage vital signs and the nursing notes.  Pertinent labs & imaging results that were available during my care of the patient were reviewed by me and considered in my medical decision making (see chart for  details).    MDM Rules/Calculators/A&P                           Wound reservoir filled with about 150 cc of blood.  This was emptied, but now the suction is not working on the wound vac.  Likely clotted off.  I spoke with Annie Main (PA for Dr. Sharol Given).  He recommended removing the wound vac and doing a dry dressing.  Pt to f/u on Wed with Dr. Sharol Given.  We removed the wound vac.  Sponge and dressing saturated with blood.  Non-adherent dressing, kerlex, and ace wrap applied to wound.  Pt tolerated well.  Return if worse. F/u with Dr. Sharol Given.  Final Clinical Impression(s) / ED Diagnoses Final diagnoses:  Encounter for management of wound VAC    Rx / DC Orders ED Discharge Orders    None       Isla Pence, MD 01/12/21 2211

## 2021-01-12 NOTE — Telephone Encounter (Signed)
Pt is being released from the hospital and would like a refill of his antibiotics called in and would like an update when this has been done.    [phone]

## 2021-01-12 NOTE — Progress Notes (Signed)
Patient ID: Keith Espinoza, male   DOB: 01/28/1941, 80 y.o.   MRN: [id number] Patient is status post revision transtibial amputation postoperative day 3.  There is no drainage in the wound VAC canister.  Patient states he is anxious to go home.  Plan for home health physical therapy discharge to home this morning.

## 2021-01-12 NOTE — Telephone Encounter (Signed)
Per dr. Sharol Given does not need any antibiotics

## 2021-01-12 NOTE — Progress Notes (Signed)
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   OT comments  Charles is progressing towards his goals. To prepare for d/c today, Eliakim dressed himself at bed level with min A for threading his wound vac through his pant leg only. Pt was educated to complete dressing tasks at home from bed level to prevent falls, pt was receptive. Discharge recommendation to return home remains appropriate, pt is able to complete all BADLs safely, and has intermittent support from daughter and granddaughter who live close by. OT to continue to follow acutely to increase independence in functional mobility.    Follow Up Recommendations  Home health OT;Other (comment) (Supervision initally for fucntinal transfers and ADLs)    Equipment Recommendations  None recommended by OT       Precautions / Restrictions Precautions Precautions: Fall Precaution Comments: R BKA Required Braces or Orthoses: Other Brace Other Brace: limb guard Restrictions Weight Bearing Restrictions: Yes RLE Weight Bearing: Non weight bearing       Mobility Bed Mobility Overal bed mobility: Modified Independent             General bed mobility comments: Mod I for use of hospital bed railing    Transfers Overall transfer level: Needs assistance   Transfers: Squat Pivot Transfers     Squat pivot transfers: Supervision     General transfer comment: Pt refused to tranfer to the chair this session, per previous PT/OT notes, pt sqat pivots from surface to surface wtih supervision A for safety    Balance                                           ADL either performed or assessed with clinical judgement   ADL Overall ADL's : Needs  assistance/impaired     Grooming: Set up;Sitting   Upper Body Bathing: Set up;Sitting       Upper Body Dressing : Set up;Sitting   Lower Body Dressing: Minimal assistance;Sitting/lateral leans Lower Body Dressing Details (indicate cue type and reason): Assistance to thread wound vac through pant leg Toilet Transfer: Min guard;Squat-pivot   Toileting- Clothing Manipulation and Hygiene: Set up;Sitting/lateral lean;Supervision/safety                         Cognition Arousal/Alertness: Awake/alert Behavior During Therapy: WFL for tasks assessed/performed Overall Cognitive Status: No family/caregiver present to determine baseline cognitive functioning                                 General Comments: most likely at baseline              General Comments Pt with wound vac on RLE, IV placed in LUE    Pertinent Vitals/ Pain       Pain Assessment: 0-10 Pain Score: 5  Pain Location: L leg Pain Descriptors / Indicators: Constant;Discomfort;Guarding Pain Intervention(s): RN gave pain meds during session         Frequency  Min 2X/week        Progress Toward Goals  OT Goals(current goals can now be  found in the care plan section)  Progress towards OT goals: Progressing toward goals  Acute Rehab OT Goals Patient Stated Goal: To go home OT Goal Formulation: With patient Time For Goal Achievement: 01/24/21 Potential to Achieve Goals: Good ADL Goals Pt Will Perform Lower Body Bathing: with modified independence;sitting/lateral leans Pt Will Perform Lower Body Dressing: with modified independence;sitting/lateral leans Pt Will Transfer to Toilet: with modified independence;regular height toilet;grab bars Additional ADL Goal #1: Pt will independently verbalize 3 strategies to reduce risk of falls  Plan Discharge plan remains appropriate       AM-PAC OT "6 Clicks" Daily Activity     Outcome Measure   Help from another person eating meals?:  None Help from another person taking care of personal grooming?: A Little Help from another person toileting, which includes using toliet, bedpan, or urinal?: A Little Help from another person bathing (including washing, rinsing, drying)?: A Little Help from another person to put on and taking off regular upper body clothing?: A Little Help from another person to put on and taking off regular lower body clothing?: A Little 6 Click Score: 19    End of Session    OT Visit Diagnosis: Other abnormalities of gait and mobility (R26.89);Muscle weakness (generalized) (M62.81)   Activity Tolerance Patient tolerated treatment well   Patient Left in bed;with call bell/phone within reach;with bed alarm set   Nurse Communication Mobility status        Time: 3710-6269 OT Time Calculation (min): 21 min  Charges: OT General Charges $OT Visit: 1 Visit OT Treatments $Self Care/Home Management : 8-22 mins     Vale Mousseau A Sequita Wise 01/12/2021, 9:33 AM

## 2021-01-12 NOTE — ED Notes (Signed)
Provider at bedside to remove woundvac and clean and dress surgical site. This nurse and Tanya EMT, at bedside to assist.

## 2021-01-12 NOTE — Discharge Summary (Signed)
Discharge Diagnoses:  Active Problems:   Dehiscence of amputation stump ([place])   Wound dehiscence   Surgeries: Procedure(s): REVISION RIGHT BELOW KNEE AMPUTATION on 01/09/2021    Consultants:   Discharged Condition: Improved  Hospital Course: REGINOLD BEALE is an 80 y.o. male who was admitted 01/09/2021 with a chief complaint of abscess right transtibial amputation, with a final diagnosis of Abscess Right Below Knee Amputation.  Patient was brought to the operating room on 01/09/2021 and underwent Procedure(s): REVISION RIGHT BELOW KNEE AMPUTATION.    Patient was given perioperative antibiotics:  Anti-infectives (From admission, onward)   Start     Dose/Rate Route Frequency Ordered Stop   01/09/21 1800  ceFAZolin (ANCEF) IVPB 1 g/50 mL premix        1 g 100 mL/hr over 30 Minutes Intravenous Every 8 hours 01/09/21 1241 01/12/21 2159   01/09/21 0830  ceFAZolin (ANCEF) IVPB 2g/100 mL premix        2 g 200 mL/hr over 30 Minutes Intravenous On call to O.R. 01/09/21 6468 01/09/21 1111    .  Patient was given sequential compression devices, early ambulation, and aspirin for DVT prophylaxis.  Recent vital signs:  Patient Vitals for the past 24 hrs:  BP Temp Temp src Pulse Resp SpO2  01/12/21 0355 104/67 99.2 F (37.3 C) Oral 86 18 92 %  01/11/21 2111 (!) 132/57 97.8 F (36.6 C) Oral 92 18 100 %  01/11/21 1644 115/64 97.6 F (36.4 C) Oral 87 16 100 %  01/11/21 1355 (!) 156/68 (!) 97.2 F (36.2 C) Oral 85 18 100 %  .  Recent laboratory studies: No results found.  Discharge Medications:   Allergies as of 01/12/2021      Reactions   Simvastatin Other (See Comments)   SEVERE MYALGIAS   Zetia [ezetimibe] Other (See Comments)   MYALGIAS   Dilaudid [hydromorphone Hcl] Other (See Comments)   hallucination      Medication List    TAKE these medications   acetaminophen 500 MG tablet Commonly known as: TYLENOL Take 1,000 mg by mouth every 6 (six) hours as needed (pain).    acetaminophen 325 MG tablet Commonly known as: TYLENOL Take 1-2 tablets (325-650 mg total) by mouth every 4 (four) hours as needed for mild pain.   allopurinol 300 MG tablet Commonly known as: ZYLOPRIM Take 300 mg by mouth daily.   amLODipine 5 MG tablet Commonly known as: NORVASC Take 1 tablet (5 mg total) by mouth daily.   aspirin EC 81 MG tablet Take 81 mg by mouth daily.   B-12 2500 MCG Tabs Take 2,500 mcg by mouth daily.   clopidogrel 75 MG tablet Commonly known as: PLAVIX Take 1 tablet (75 mg total) by mouth daily.   doxycycline 100 MG tablet Commonly known as: VIBRA-TABS Take 1 tablet (100 mg total) by mouth every 12 (twelve) hours.   empagliflozin 25 MG Tabs tablet Commonly known as: JARDIANCE Take 25 mg by mouth daily.   gabapentin 400 MG capsule Commonly known as: NEURONTIN Take 1 capsule (400 mg total) by mouth 3 (three) times daily.   gabapentin 100 MG capsule Commonly known as: NEURONTIN Take 2 capsules (200 mg total) by mouth at bedtime.   hydrochlorothiazide 12.5 MG tablet Commonly known as: HYDRODIURIL Take 1 tablet (12.5 mg total) by mouth daily.   isosorbide mononitrate 30 MG 24 hr tablet Commonly known as: IMDUR Take 1 tablet (30 mg total) by mouth daily. Please schedule yearly appointment for future refills.  Thank you   Levemir FlexTouch 100 UNIT/ML FlexPen Generic drug: insulin detemir Inject 8-18 Units into the skin at bedtime. Dose is based on CBG   losartan 50 MG tablet Commonly known as: COZAAR Take 1 tablet (50 mg total) by mouth daily.   metFORMIN 1000 MG tablet Commonly known as: GLUCOPHAGE Take 1 tablet (1,000 mg total) by mouth 2 (two) times daily.   methocarbamol 500 MG tablet Commonly known as: ROBAXIN Take 1 tablet (500 mg total) by mouth every 6 (six) hours as needed for muscle spasms.   nitroGLYCERIN 0.4 MG SL tablet Commonly known as: NITROSTAT Place 0.4 mg under the tongue every 5 (five) minutes x 3 doses as needed  for chest pain.   ONE TOUCH ULTRA TEST test strip Generic drug: glucose blood CHECK BLOOD SUGAR ONCE DAILY AS DIRECTED   oxyCODONE 5 MG immediate release tablet Commonly known as: Oxy IR/ROXICODONE Take 1 tablet (5 mg total) by mouth every 4 (four) hours as needed for severe pain. What changed:   when to take this  reasons to take this   terazosin 5 MG capsule Commonly known as: HYTRIN Take 1 capsule (5 mg total) by mouth at bedtime.   traMADol 50 MG tablet Commonly known as: ULTRAM Take 1 tablet (50 mg total) by mouth every 6 (six) hours as needed for moderate pain.   traZODone 50 MG tablet Commonly known as: DESYREL Take 0.5-1 tablets (25-50 mg total) by mouth at bedtime as needed for sleep. What changed: how much to take   Vitamin D3 50 MCG (2000 UT) Tabs Take 2,000 Units by mouth daily.       Diagnostic Studies: No results found.  Patient benefited maximally from their hospital stay and there were no complications.     Disposition: Discharge disposition: 01-Home or Self Care      Discharge Instructions    Call MD / Call 911   Complete by: As directed    If you experience chest pain or shortness of breath, CALL 911 and be transported to the hospital emergency room.  If you develope a fever above 101 F, pus (white drainage) or increased drainage or redness at the wound, or calf pain, call your surgeon's office.   Constipation Prevention   Complete by: As directed    Drink plenty of fluids.  Prune juice may be helpful.  You may use a stool softener, such as Colace (over the counter) 100 mg twice a day.  Use MiraLax (over the counter) for constipation as needed.   Diet - low sodium heart healthy   Complete by: As directed    Increase activity slowly as tolerated   Complete by: As directed    Negative Pressure Wound Therapy - Incisional   Complete by: As directed    Show patient how to attach prevena. Should call office if alarms      Follow-up Information     Persons, Bevely Palmer, [place] In 1 week.   Specialty: Orthopedic Surgery Contact information: [place] [address] [address] [facility].   Specialty: Home Health Services Why: Phone: [phone] Physical and occupational therapist to follow up with you at home.  They will call you to schedule an appointment.                Signed: [name] 01/12/2021, 7:10 AM

## 2021-01-12 NOTE — Plan of Care (Signed)

## 2021-01-14 ENCOUNTER — Telehealth: Payer: Self-pay | Admitting: Orthopedic Surgery

## 2021-01-14 ENCOUNTER — Other Ambulatory Visit: Payer: Self-pay | Admitting: *Deleted

## 2021-01-14 ENCOUNTER — Encounter: Payer: Self-pay | Admitting: Family

## 2021-01-14 ENCOUNTER — Ambulatory Visit (INDEPENDENT_AMBULATORY_CARE_PROVIDER_SITE_OTHER): Payer: Medicare Other | Admitting: Family

## 2021-01-14 DIAGNOSIS — E1142 Type 2 diabetes mellitus with diabetic polyneuropathy: Secondary | ICD-10-CM | POA: Diagnosis not present

## 2021-01-14 DIAGNOSIS — Z794 Long term (current) use of insulin: Secondary | ICD-10-CM | POA: Diagnosis not present

## 2021-01-14 DIAGNOSIS — J309 Allergic rhinitis, unspecified: Secondary | ICD-10-CM | POA: Diagnosis not present

## 2021-01-14 DIAGNOSIS — Z85828 Personal history of other malignant neoplasm of skin: Secondary | ICD-10-CM | POA: Diagnosis not present

## 2021-01-14 DIAGNOSIS — I2510 Atherosclerotic heart disease of native coronary artery without angina pectoris: Secondary | ICD-10-CM | POA: Diagnosis not present

## 2021-01-14 DIAGNOSIS — Z7984 Long term (current) use of oral hypoglycemic drugs: Secondary | ICD-10-CM | POA: Diagnosis not present

## 2021-01-14 DIAGNOSIS — M545 Low back pain, unspecified: Secondary | ICD-10-CM | POA: Diagnosis not present

## 2021-01-14 DIAGNOSIS — S81001D Unspecified open wound, right knee, subsequent encounter: Secondary | ICD-10-CM | POA: Diagnosis not present

## 2021-01-14 DIAGNOSIS — Z9181 History of falling: Secondary | ICD-10-CM | POA: Diagnosis not present

## 2021-01-14 DIAGNOSIS — K219 Gastro-esophageal reflux disease without esophagitis: Secondary | ICD-10-CM | POA: Diagnosis not present

## 2021-01-14 DIAGNOSIS — M79661 Pain in right lower leg: Secondary | ICD-10-CM | POA: Diagnosis not present

## 2021-01-14 DIAGNOSIS — I5032 Chronic diastolic (congestive) heart failure: Secondary | ICD-10-CM | POA: Diagnosis not present

## 2021-01-14 DIAGNOSIS — C44509 Unspecified malignant neoplasm of skin of other part of trunk: Secondary | ICD-10-CM | POA: Diagnosis not present

## 2021-01-14 DIAGNOSIS — Z89511 Acquired absence of right leg below knee: Secondary | ICD-10-CM | POA: Diagnosis not present

## 2021-01-14 DIAGNOSIS — K5909 Other constipation: Secondary | ICD-10-CM | POA: Diagnosis not present

## 2021-01-14 DIAGNOSIS — E538 Deficiency of other specified B group vitamins: Secondary | ICD-10-CM | POA: Diagnosis not present

## 2021-01-14 DIAGNOSIS — D696 Thrombocytopenia, unspecified: Secondary | ICD-10-CM | POA: Diagnosis not present

## 2021-01-14 DIAGNOSIS — I110 Hypertensive heart disease with heart failure: Secondary | ICD-10-CM | POA: Diagnosis not present

## 2021-01-14 DIAGNOSIS — Z87891 Personal history of nicotine dependence: Secondary | ICD-10-CM | POA: Diagnosis not present

## 2021-01-14 DIAGNOSIS — N400 Enlarged prostate without lower urinary tract symptoms: Secondary | ICD-10-CM | POA: Diagnosis not present

## 2021-01-14 DIAGNOSIS — M109 Gout, unspecified: Secondary | ICD-10-CM | POA: Diagnosis not present

## 2021-01-14 DIAGNOSIS — K5790 Diverticulosis of intestine, part unspecified, without perforation or abscess without bleeding: Secondary | ICD-10-CM | POA: Diagnosis not present

## 2021-01-14 DIAGNOSIS — G8929 Other chronic pain: Secondary | ICD-10-CM | POA: Diagnosis not present

## 2021-01-14 DIAGNOSIS — Z8616 Personal history of COVID-19: Secondary | ICD-10-CM | POA: Diagnosis not present

## 2021-01-14 DIAGNOSIS — Z79891 Long term (current) use of opiate analgesic: Secondary | ICD-10-CM | POA: Diagnosis not present

## 2021-01-14 DIAGNOSIS — E876 Hypokalemia: Secondary | ICD-10-CM | POA: Diagnosis not present

## 2021-01-14 DIAGNOSIS — I10 Essential (primary) hypertension: Secondary | ICD-10-CM | POA: Diagnosis not present

## 2021-01-14 DIAGNOSIS — E1151 Type 2 diabetes mellitus with diabetic peripheral angiopathy without gangrene: Secondary | ICD-10-CM | POA: Diagnosis not present

## 2021-01-14 DIAGNOSIS — I252 Old myocardial infarction: Secondary | ICD-10-CM | POA: Diagnosis not present

## 2021-01-14 DIAGNOSIS — R011 Cardiac murmur, unspecified: Secondary | ICD-10-CM | POA: Diagnosis not present

## 2021-01-14 DIAGNOSIS — Z7982 Long term (current) use of aspirin: Secondary | ICD-10-CM | POA: Diagnosis not present

## 2021-01-14 DIAGNOSIS — Z7902 Long term (current) use of antithrombotics/antiplatelets: Secondary | ICD-10-CM | POA: Diagnosis not present

## 2021-01-14 DIAGNOSIS — E1140 Type 2 diabetes mellitus with diabetic neuropathy, unspecified: Secondary | ICD-10-CM | POA: Diagnosis not present

## 2021-01-14 DIAGNOSIS — E1165 Type 2 diabetes mellitus with hyperglycemia: Secondary | ICD-10-CM | POA: Diagnosis not present

## 2021-01-14 DIAGNOSIS — T8781 Dehiscence of amputation stump: Secondary | ICD-10-CM

## 2021-01-14 DIAGNOSIS — M1990 Unspecified osteoarthritis, unspecified site: Secondary | ICD-10-CM | POA: Diagnosis not present

## 2021-01-14 DIAGNOSIS — F341 Dysthymic disorder: Secondary | ICD-10-CM | POA: Diagnosis not present

## 2021-01-14 DIAGNOSIS — Z4781 Encounter for orthopedic aftercare following surgical amputation: Secondary | ICD-10-CM | POA: Diagnosis not present

## 2021-01-14 DIAGNOSIS — I499 Cardiac arrhythmia, unspecified: Secondary | ICD-10-CM | POA: Diagnosis not present

## 2021-01-14 NOTE — Telephone Encounter (Signed)
Received call from [place] with [facility] needing verbal orders for HHPT 2 wk 3 and 1 wk 4. The number to contact Richardson Landry is [phone]

## 2021-01-14 NOTE — Progress Notes (Signed)
Post-Op Visit Note   Patient: Keith Espinoza           Date of Birth: [birth year]           MRN: [id number] Visit Date: 01/14/2021 PCP: [name], MD  Chief Complaint: No chief complaint on file.   HPI:  HPI The patient is an 80 year old gentleman seen status post revision right below-knee amputation last Friday his wound VAC was removed earlier this week today he has a dry dressing with an Ace wrap Ortho Exam Incision well approximated sutures this is healing well.  There is no gaping no drainage minimal swelling  Visit Diagnoses: No diagnosis found.  Plan: Begin daily dose of cleansing.  Shrinker with direct skin contact.  He will follow-up in 2 weeks for suture removal.  Follow-Up Instructions: No follow-ups on file.   Imaging: No results found.  Orders:  No orders of the defined types were placed in this encounter.  No orders of the defined types were placed in this encounter.    PMFS History: Patient Active Problem List   Diagnosis Date Noted  . Wound dehiscence 01/09/2021  . Dehiscence of amputation stump ([place])   . Status post percutaneous transluminal coronary angioplasty 01/06/2021  . Type II diabetes mellitus, uncontrolled ([place]) 01/06/2021  . Acute pancreatitis 01/06/2021  . Diabetic peripheral vascular disease ([place]) 01/06/2021  . Encounter for screening for other disorder 01/06/2021  . Enlarged prostate 01/06/2021  . Foot ulcer, right ([place]) 01/06/2021  . Gout 01/06/2021  . Headache 01/06/2021  . Neck pain 01/06/2021  . Hypoglycemia 01/06/2021  . Loss of appetite 01/06/2021  . 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 Peripheral neuropathy 01/06/2021  . Sciatica 01/06/2021  . Vitamin B12 deficiency 01/06/2021  . Vitamin D deficiency 01/06/2021  . Weakness 01/06/2021  . Diabetes mellitus type 2 with neurological manifestations ([place]) 01/06/2021  . Protein-calorie malnutrition, severe 12/26/2020  . Acute blood loss anemia 12/26/2020  .  Prerenal azotemia 12/26/2020  . Below-knee amputation of right lower extremity ([place]) 12/12/2020  . Gangrene of right foot ([place])   . Diabetic neuropathy ([place]) 11/17/2020  . Hyperglycemia due to type 2 diabetes mellitus ([place]) 11/17/2020  . Long term (current) use of insulin ([place]) 11/17/2020  . Obesity 11/17/2020  . 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/2019  . Hip dislocation, right ([place]) 04/13/2019  . Other intervertebral disc degeneration, lumbar region 03/30/2019  . CAD (coronary artery disease) 01/30/2019  . Tobacco abuse 01/30/2019  . 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  . Burn, foot, second degree, left, initial encounter 06/08/2017  . 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 S/P total knee arthroplasty, left 10/26/2016  . Hyperlipidemia 09/04/2014  . Thrombocytopenia ([place])   . Precordial chest pain 04/05/2014  . Coronary atherosclerosis of native coronary artery 10/01/2013  . Other and unspecified hyperlipidemia 10/01/2013  . Essential hypertension, benign 10/01/2013  . Insulin dependent type 2 diabetes mellitus ([place]) 10/01/2013  . Esophageal reflux 10/01/2013  . Hypertrophy of prostate without urinary obstruction and other lower urinary tract symptoms (LUTS) 10/01/2013   Past Medical History:  Diagnosis Date  . Allergic rhinitis   . Allergic rhinitis   . Arthritis   . Basal cell carcinoma 11/01/2019    bcc left chest treatment [place] cx3 14fu   . Chronic leg pain    right  . Chronic lower back pain   . Coronary artery disease    a. Stenting to RCA 2004; staged DES to LAD and  Cx 2004. DES to mRCA 2012. b. DES to mCx, PTCA to dCx 11/2011. c. Lateral wall MI 2013 s/p PTCA to distal Cx & DES to mid OM2 11/2011. d. Low risk nuc 04/2014, EF wnl.  Marland Kitchen COVID-19   . Diabetes mellitus    Insulin dependent  . Diabetic neuropathy ([facility])    MILD  . Diverticulosis   . Dysrhythmia   . Gilbert syndrome   . Gout    right wrist; right foot; right elbow; have had it  since 1970's  . H/O hiatal hernia   . Heart murmur   . History of echocardiogram    aortic sclerosis per echo 12/09 EF 65%, otherwise normal  . History of hemorrhoids    BLEEDING  . History of kidney stones    h/o  . Hypertension    Diagnosed 1995   . Myocardial infarction ([place])   . Pancreatic pseudocyst    a. s/p remote drainage 2006.  Marland Kitchen Thrombocytopenia ([place])    Seen on oldest labs in system from 2004  . Vitamin B 12 deficiency    orally replaced    Family History  Problem Relation Age of Onset  . Diabetes Mother   . Hyperlipidemia Mother   . Hypertension Mother   . Cancer Father   . Hypertension Father   . Diabetes Sister   . Hypertension Sister   . Cancer Brother   . Heart attack Neg Hx     Past Surgical History:  Procedure Laterality Date  . ABDOMINAL AORTOGRAM W/LOWER EXTREMITY Bilateral 08/08/2020   Procedure: ABDOMINAL AORTOGRAM W/LOWER EXTREMITY;  Surgeon: Angelia Mould, MD;  Location: [facility];  Service: Cardiovascular;  Laterality: Bilateral;  . AMPUTATION Right 12/12/2020   Procedure: RIGHT BELOW KNEE AMPUTATION;  Surgeon: [name], MD;  Location: [facility];  Service: Orthopedics;  Laterality: Right;  . BACK SURGERY     "total of 3 times" S/P fall   . CARPAL TUNNEL RELEASE Bilateral   . CHOLECYSTECTOMY  1990's  . COLONOSCOPY    . CORONARY ANGIOPLASTY  11/11/11  . CORONARY ANGIOPLASTY WITH STENT PLACEMENT  09/30/2011   "1 then; makes a total of 4"  . CORONARY ANGIOPLASTY WITH STENT PLACEMENT  11/11/11   "1; makes a total of 5"  . INGUINAL HERNIA REPAIR  2003   right  . JOINT REPLACEMENT Right 04/03/2002   hip replacment  . KNEE ARTHROSCOPY  1990's   left  . LEFT HEART CATHETERIZATION WITH CORONARY ANGIOGRAM N/A 09/30/2011   Procedure: LEFT HEART CATHETERIZATION WITH CORONARY ANGIOGRAM;  Surgeon: Jettie Booze, MD;  Location: [facility];  Service: Cardiovascular;  Laterality: N/A;  possible PCI  . LEFT HEART CATHETERIZATION WITH  CORONARY ANGIOGRAM N/A 11/15/2011   Procedure: LEFT HEART CATHETERIZATION WITH CORONARY ANGIOGRAM;  Surgeon: Jettie Booze, MD;  Location: [facility] CATH LAB;  Service: Cardiovascular;  Laterality: N/A;  . PERCUTANEOUS CORONARY STENT INTERVENTION (PCI-S)  09/30/2011   Procedure: PERCUTANEOUS CORONARY STENT INTERVENTION (PCI-S);  Surgeon: Jettie Booze, MD;  Location: [facility] CATH LAB;  Service: Cardiovascular;;  . PERCUTANEOUS CORONARY STENT INTERVENTION (PCI-S) N/A 11/11/2011   Procedure: PERCUTANEOUS CORONARY STENT INTERVENTION (PCI-S);  Surgeon: Jettie Booze, MD;  Location: [facility] CATH LAB;  Service: Cardiovascular;  Laterality: N/A;  . PERIPHERAL VASCULAR BALLOON ANGIOPLASTY Right 08/08/2020   Procedure: PERIPHERAL VASCULAR BALLOON ANGIOPLASTY;  Surgeon: Angelia Mould, MD;  Location: [facility];  Service: Cardiovascular;  Laterality: Right;  Posterior tibial   .  SHOULDER SURGERY Right    X 2  . STUMP REVISION Right 01/09/2021   Procedure: REVISION RIGHT BELOW KNEE AMPUTATION;  Surgeon: [name], MD;  Location: [facility];  Service: Orthopedics;  Laterality: Right;  . TONSILLECTOMY  ~ 1948  . TOTAL HIP REVISION Right 04/13/2019   Procedure: RIGHT TOTAL HIP REVISION-POSTERIOR  APPROACH LATERAL;  Surgeon: Marybelle Killings, MD;  Location: [facility];  Service: Orthopedics;  Laterality: Right;  . TOTAL KNEE ARTHROPLASTY Left 07/23/2016   Procedure: LEFT TOTAL KNEE ARTHROPLASTY;  Surgeon: Marybelle Killings, MD;  Location: [facility];  Service: Orthopedics;  Laterality: Left;   Social History   Occupational History  . Occupation: Retired  Tobacco Use  . Smoking status: Former Research scientist (life sciences)  . Smokeless tobacco: Current User    Types: Chew  . Tobacco comment: quit 60 years ago  Vaping Use  . Vaping Use: Never used  Substance and Sexual Activity  . Alcohol use: No  . Drug use: No  . Sexual activity: Not Currently

## 2021-01-14 NOTE — Patient Outreach (Signed)
[facility]) Care Management  01/14/2021  Keith Espinoza 09-25-1941 [id number]   Referral Date: 4/12 Referral Source: Hospital Liaison Referral Reason: Post hospital discharge, amputation revision Insurance: Medicare   Outreach attempt #1, unsuccessful, unable to leave voice message.     Plan: RN CM will send outreach letter and follow up within the next 3-4 business days.  Valente David, [place], MSN [place] [phone]

## 2021-01-15 ENCOUNTER — Encounter: Payer: Medicare Other | Admitting: Physical Medicine and Rehabilitation

## 2021-01-15 DIAGNOSIS — E1140 Type 2 diabetes mellitus with diabetic neuropathy, unspecified: Secondary | ICD-10-CM | POA: Diagnosis not present

## 2021-01-15 DIAGNOSIS — M545 Low back pain, unspecified: Secondary | ICD-10-CM | POA: Diagnosis not present

## 2021-01-15 DIAGNOSIS — S81001D Unspecified open wound, right knee, subsequent encounter: Secondary | ICD-10-CM | POA: Diagnosis not present

## 2021-01-15 DIAGNOSIS — E1165 Type 2 diabetes mellitus with hyperglycemia: Secondary | ICD-10-CM | POA: Diagnosis not present

## 2021-01-15 DIAGNOSIS — I10 Essential (primary) hypertension: Secondary | ICD-10-CM | POA: Diagnosis not present

## 2021-01-15 DIAGNOSIS — Z4781 Encounter for orthopedic aftercare following surgical amputation: Secondary | ICD-10-CM | POA: Diagnosis not present

## 2021-01-15 DIAGNOSIS — K5909 Other constipation: Secondary | ICD-10-CM | POA: Diagnosis not present

## 2021-01-15 DIAGNOSIS — I2510 Atherosclerotic heart disease of native coronary artery without angina pectoris: Secondary | ICD-10-CM | POA: Diagnosis not present

## 2021-01-15 DIAGNOSIS — Z89511 Acquired absence of right leg below knee: Secondary | ICD-10-CM | POA: Diagnosis not present

## 2021-01-15 DIAGNOSIS — G8929 Other chronic pain: Secondary | ICD-10-CM | POA: Diagnosis not present

## 2021-01-15 DIAGNOSIS — E1151 Type 2 diabetes mellitus with diabetic peripheral angiopathy without gangrene: Secondary | ICD-10-CM | POA: Diagnosis not present

## 2021-01-15 NOTE — Telephone Encounter (Signed)
Called and lm on vm to advise verbal ok for orders below. To call with questions.

## 2021-01-16 DIAGNOSIS — I2510 Atherosclerotic heart disease of native coronary artery without angina pectoris: Secondary | ICD-10-CM | POA: Diagnosis not present

## 2021-01-16 DIAGNOSIS — S81001D Unspecified open wound, right knee, subsequent encounter: Secondary | ICD-10-CM | POA: Diagnosis not present

## 2021-01-16 DIAGNOSIS — I10 Essential (primary) hypertension: Secondary | ICD-10-CM | POA: Diagnosis not present

## 2021-01-16 DIAGNOSIS — E1165 Type 2 diabetes mellitus with hyperglycemia: Secondary | ICD-10-CM | POA: Diagnosis not present

## 2021-01-16 DIAGNOSIS — E1140 Type 2 diabetes mellitus with diabetic neuropathy, unspecified: Secondary | ICD-10-CM | POA: Diagnosis not present

## 2021-01-16 DIAGNOSIS — Z89511 Acquired absence of right leg below knee: Secondary | ICD-10-CM | POA: Diagnosis not present

## 2021-01-16 DIAGNOSIS — G8929 Other chronic pain: Secondary | ICD-10-CM | POA: Diagnosis not present

## 2021-01-16 DIAGNOSIS — Z4781 Encounter for orthopedic aftercare following surgical amputation: Secondary | ICD-10-CM | POA: Diagnosis not present

## 2021-01-16 DIAGNOSIS — M545 Low back pain, unspecified: Secondary | ICD-10-CM | POA: Diagnosis not present

## 2021-01-16 DIAGNOSIS — K5909 Other constipation: Secondary | ICD-10-CM | POA: Diagnosis not present

## 2021-01-16 DIAGNOSIS — E1151 Type 2 diabetes mellitus with diabetic peripheral angiopathy without gangrene: Secondary | ICD-10-CM | POA: Diagnosis not present

## 2021-01-19 ENCOUNTER — Telehealth: Payer: Self-pay | Admitting: Orthopedic Surgery

## 2021-01-19 ENCOUNTER — Ambulatory Visit (INDEPENDENT_AMBULATORY_CARE_PROVIDER_SITE_OTHER): Payer: Medicare Other | Admitting: Orthopedic Surgery

## 2021-01-19 DIAGNOSIS — T8781 Dehiscence of amputation stump: Secondary | ICD-10-CM

## 2021-01-19 DIAGNOSIS — Z89511 Acquired absence of right leg below knee: Secondary | ICD-10-CM

## 2021-01-19 NOTE — Telephone Encounter (Signed)
I called gave verbal ok for this pt to have HHn orders as below. To call with any questions.

## 2021-01-19 NOTE — Telephone Encounter (Signed)
Keith Espinoza with [facility] called wanting to get Verbal nursing orders with the frequency of once a week for 1 week, twice a week for 3 weeks and finally once a week for 5 weeks.   Keith Espinoza CB# [phone]

## 2021-01-20 ENCOUNTER — Other Ambulatory Visit: Payer: Self-pay | Admitting: *Deleted

## 2021-01-20 ENCOUNTER — Encounter: Payer: Self-pay | Admitting: *Deleted

## 2021-01-20 ENCOUNTER — Encounter: Payer: Self-pay | Admitting: Orthopedic Surgery

## 2021-01-20 DIAGNOSIS — M545 Low back pain, unspecified: Secondary | ICD-10-CM | POA: Diagnosis not present

## 2021-01-20 DIAGNOSIS — I10 Essential (primary) hypertension: Secondary | ICD-10-CM | POA: Diagnosis not present

## 2021-01-20 DIAGNOSIS — Z89511 Acquired absence of right leg below knee: Secondary | ICD-10-CM | POA: Diagnosis not present

## 2021-01-20 DIAGNOSIS — I2510 Atherosclerotic heart disease of native coronary artery without angina pectoris: Secondary | ICD-10-CM | POA: Diagnosis not present

## 2021-01-20 DIAGNOSIS — E1151 Type 2 diabetes mellitus with diabetic peripheral angiopathy without gangrene: Secondary | ICD-10-CM | POA: Diagnosis not present

## 2021-01-20 DIAGNOSIS — E1165 Type 2 diabetes mellitus with hyperglycemia: Secondary | ICD-10-CM | POA: Diagnosis not present

## 2021-01-20 DIAGNOSIS — Z4781 Encounter for orthopedic aftercare following surgical amputation: Secondary | ICD-10-CM | POA: Diagnosis not present

## 2021-01-20 DIAGNOSIS — S81001D Unspecified open wound, right knee, subsequent encounter: Secondary | ICD-10-CM | POA: Diagnosis not present

## 2021-01-20 DIAGNOSIS — G8929 Other chronic pain: Secondary | ICD-10-CM | POA: Diagnosis not present

## 2021-01-20 DIAGNOSIS — K5909 Other constipation: Secondary | ICD-10-CM | POA: Diagnosis not present

## 2021-01-20 DIAGNOSIS — E1140 Type 2 diabetes mellitus with diabetic neuropathy, unspecified: Secondary | ICD-10-CM | POA: Diagnosis not present

## 2021-01-20 NOTE — Progress Notes (Signed)
Office Visit Note   Patient: Keith Espinoza           Date of Birth: 05/05/1941           MRN: [id number] Visit Date: 01/19/2021              Requested by: [name], MD [address] [place] [address],  [place] [address] PCP: [name], MD  Chief Complaint  Patient presents with  . Right Leg - Routine Post Op    01/09/2021 revision right BKA       HPI: Patient is an 80 year old gentleman who presents 2 weeks status post revision right transtibial amputation patient complains of increased swelling.  Patient states he just returned from a wedding with his leg dependent.  Assessment & Plan: Visit Diagnoses:  1. Right below-knee amputee ([place])   2. Dehiscence of amputation stump (HCC)     Plan: Recommend patient wear his compression sleeve around-the-clock to change this daily elevate his leg.  Continue protein supplements remove sutures and staples at follow-up.  Follow-Up Instructions: Return in about 1 week (around 01/26/2021).   Ortho Exam  Patient is alert, oriented, no adenopathy, well-dressed, normal affect, normal respiratory effort. Examination patient has increased swelling there is no wound dehiscence the wound is well approximated there is no cellulitis no odor no drainage.  The extra-large stump shrinker was applied.  Imaging: No results found. No images are attached to the encounter.  Labs: Lab Results  Component Value Date   HGBA1C 7.7 (H) 12/12/2020   HGBA1C 6.6 (H) 04/10/2019   HGBA1C 7.0 (H) 07/14/2016   REPTSTATUS 04/18/2019 FINAL 04/13/2019   GRAMSTAIN  04/13/2019    RARE WBC PRESENT, PREDOMINANTLY PMN NO ORGANISMS SEEN    CULT  04/13/2019    No growth aerobically or anaerobically. Performed at [facility], [place] [address] [place], [place] [address]      Lab Results  Component Value Date   ALBUMIN 2.9 (L) 12/17/2020   ALBUMIN 4.4 04/10/2019   ALBUMIN 4.2 07/14/2016    No results found for: MG No results found for:  VD25OH  No results found for: PREALBUMIN CBC EXTENDED Latest Ref Rng & Units 01/11/2021 01/10/2021 01/09/2021  WBC 4.0 - 10.5 K/uL 7.7 9.2 6.7  RBC 4.22 - 5.81 MIL/uL 3.65(L) 3.48(L) 4.12(L)  HGB 13.0 - 17.0 g/dL 11.0(L) 10.6(L) 12.3(L)  HCT 39.0 - 52.0 % 32.5(L) 30.8(L) 37.2(L)  PLT 150 - 400 K/uL 135(L) 162 171  NEUTROABS 1.7 - 7.7 K/uL - - -  LYMPHSABS 0.7 - 4.0 K/uL - - -     There is no height or weight on file to calculate BMI.  Orders:  No orders of the defined types were placed in this encounter.  No orders of the defined types were placed in this encounter.    Procedures: No procedures performed  Clinical Data: No additional findings.  ROS:  All other systems negative, except as noted in the HPI. Review of Systems  Objective: Vital Signs: There were no vitals taken for this visit.  Specialty Comments:  No specialty comments available.  PMFS History: Patient Active Problem List   Diagnosis Date Noted  . Wound dehiscence 01/09/2021  . Dehiscence of amputation stump ([place])   . Status post percutaneous transluminal coronary angioplasty 01/06/2021  . Type II diabetes mellitus, uncontrolled ([place]) 01/06/2021  . Acute pancreatitis 01/06/2021  . Diabetic peripheral vascular disease ([place]) 01/06/2021  . Encounter for screening for other disorder  01/06/2021  . Enlarged prostate 01/06/2021  . Foot ulcer, right ([place]) 01/06/2021  . Gout 01/06/2021  . Headache 01/06/2021  . Neck pain 01/06/2021  . Hypoglycemia 01/06/2021  . Loss of appetite 01/06/2021  . 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 Peripheral neuropathy 01/06/2021  . Sciatica 01/06/2021  . Vitamin B12 deficiency 01/06/2021  . Vitamin D deficiency 01/06/2021  . Weakness 01/06/2021  . Diabetes mellitus type 2 with neurological manifestations ([place]) 01/06/2021  . Protein-calorie malnutrition, severe 12/26/2020  . Acute blood loss anemia 12/26/2020  . Prerenal azotemia 12/26/2020  . Below-knee  amputation of right lower extremity ([place]) 12/12/2020  . Gangrene of right foot ([place])   . Diabetic neuropathy ([place]) 11/17/2020  . Hyperglycemia due to type 2 diabetes mellitus ([place]) 11/17/2020  . Long term (current) use of insulin ([place]) 11/17/2020  . Obesity 11/17/2020  . 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/2019  . Hip dislocation, right ([place]) 04/13/2019  . Other intervertebral disc degeneration, lumbar region 03/30/2019  . CAD (coronary artery disease) 01/30/2019  . Tobacco abuse 01/30/2019  . 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  . Burn, foot, second degree, left, initial encounter 06/08/2017  . 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 S/P total knee arthroplasty, left 10/26/2016  . Hyperlipidemia 09/04/2014  . Thrombocytopenia ([place])   . Precordial chest pain 04/05/2014  . Coronary atherosclerosis of native coronary artery 10/01/2013  . Other and unspecified hyperlipidemia 10/01/2013  . Essential hypertension, benign 10/01/2013  . Insulin dependent type 2 diabetes mellitus ([place]) 10/01/2013  . Esophageal reflux 10/01/2013  . Hypertrophy of prostate without urinary obstruction and other lower urinary tract symptoms (LUTS) 10/01/2013   Past Medical History:  Diagnosis Date  . Allergic rhinitis   . Allergic rhinitis   . Arthritis   . Basal cell carcinoma 11/01/2019    bcc left chest treatment TX cx3 3fu   . Chronic leg pain    right  . Chronic lower back pain   . Coronary artery disease    a. Stenting to RCA 2004; staged DES to LAD and Cx 2004. DES to mRCA 2012. b. DES to mCx, PTCA to dCx 11/2011. c. Lateral wall MI 2013 s/p PTCA to distal Cx & DES to mid OM2 11/2011. d. Low risk nuc 04/2014, EF wnl.  Marland Kitchen COVID-19   . Diabetes mellitus    Insulin dependent  . Diabetic neuropathy (HCC)    MILD  . Diverticulosis   . Dysrhythmia   . Gilbert syndrome   . Gout    right wrist; right foot; right elbow; have had it since 1970's  . H/O hiatal hernia   .  Heart murmur   . History of echocardiogram    aortic sclerosis per echo 12/09 EF 65%, otherwise normal  . History of hemorrhoids    BLEEDING  . History of kidney stones    h/o  . Hypertension    Diagnosed 1995   . Myocardial infarction ([place])   . Pancreatic pseudocyst    a. s/p remote drainage 2006.  Marland Kitchen Thrombocytopenia ([place])    Seen on oldest labs in system from 2004  . Vitamin B 12 deficiency    orally replaced    Family History  Problem Relation Age of [place]  . Diabetes Mother   . Hyperlipidemia Mother   . Hypertension Mother   . Cancer Father   . Hypertension Father   . Diabetes Sister   . Hypertension Sister   . Cancer Brother   .  Heart attack Neg Hx     Past Surgical History:  Procedure Laterality Date  . ABDOMINAL AORTOGRAM W/LOWER EXTREMITY Bilateral 08/08/2020   Procedure: ABDOMINAL AORTOGRAM W/LOWER EXTREMITY;  Surgeon: Angelia Mould, MD;  Location: [facility];  Service: Cardiovascular;  Laterality: Bilateral;  . AMPUTATION Right 12/12/2020   Procedure: RIGHT BELOW KNEE AMPUTATION;  Surgeon: [name], MD;  Location: [facility];  Service: Orthopedics;  Laterality: Right;  . BACK SURGERY     "total of 3 times" S/P fall   . CARPAL TUNNEL RELEASE Bilateral   . CHOLECYSTECTOMY  1990's  . COLONOSCOPY    . CORONARY ANGIOPLASTY  11/11/11  . CORONARY ANGIOPLASTY WITH STENT PLACEMENT  09/30/2011   "1 then; makes a total of 4"  . CORONARY ANGIOPLASTY WITH STENT PLACEMENT  11/11/11   "1; makes a total of 5"  . INGUINAL HERNIA REPAIR  2003   right  . JOINT REPLACEMENT Right 04/03/2002   hip replacment  . KNEE ARTHROSCOPY  1990's   left  . LEFT HEART CATHETERIZATION WITH CORONARY ANGIOGRAM N/A 09/30/2011   Procedure: LEFT HEART CATHETERIZATION WITH CORONARY ANGIOGRAM;  Surgeon: Jettie Booze, MD;  Location: [facility] C[facility] LAB;  Service: Cardiovascular;  Laterality: N/A;  possible PCI  . LEFT HEART CATHETERIZATION WITH CORONARY ANGIOGRAM N/A 11/15/2011    Procedure: LEFT HEART CATHETERIZATION WITH CORONARY ANGIOGRAM;  Surgeon: Jettie Booze, MD;  Location: [facility] CATH LAB;  Service: Cardiovascular;  Laterality: N/A;  . PERCUTANEOUS CORONARY STENT INTERVENTION (PCI-S)  09/30/2011   Procedure: PERCUTANEOUS CORONARY STENT INTERVENTION (PCI-S);  Surgeon: Jettie Booze, MD;  Location: [facility] CATH LAB;  Service: Cardiovascular;;  . PERCUTANEOUS CORONARY STENT INTERVENTION (PCI-S) N/A 11/11/2011   Procedure: PERCUTANEOUS CORONARY STENT INTERVENTION (PCI-S);  Surgeon: Jettie Booze, MD;  Location: [facility] CATH LAB;  Service: Cardiovascular;  Laterality: N/A;  . PERIPHERAL VASCULAR BALLOON ANGIOPLASTY Right 08/08/2020   Procedure: PERIPHERAL VASCULAR BALLOON ANGIOPLASTY;  Surgeon: Angelia Mould, MD;  Location: [facility];  Service: Cardiovascular;  Laterality: Right;  Posterior tibial   . SHOULDER SURGERY Right    X 2  . STUMP REVISION Right 01/09/2021   Procedure: REVISION RIGHT BELOW KNEE AMPUTATION;  Surgeon: [name], MD;  Location: [facility];  Service: Orthopedics;  Laterality: Right;  . TONSILLECTOMY  ~ 1948  . TOTAL HIP REVISION Right 04/13/2019   Procedure: RIGHT TOTAL HIP REVISION-POSTERIOR  APPROACH LATERAL;  Surgeon: Marybelle Killings, MD;  Location: [facility];  Service: Orthopedics;  Laterality: Right;  . TOTAL KNEE ARTHROPLASTY Left 07/23/2016   Procedure: LEFT TOTAL KNEE ARTHROPLASTY;  Surgeon: Marybelle Killings, MD;  Location: [facility];  Service: Orthopedics;  Laterality: Left;   Social History   Occupational History  . Occupation: Retired  Tobacco Use  . Smoking status: Former Research scientist (life sciences)  . Smokeless tobacco: Current User    Types: Chew  . Tobacco comment: quit 60 years ago  Vaping Use  . Vaping Use: Never used  Substance and Sexual Activity  . Alcohol use: No  . Drug use: No  . Sexual activity: Not Currently

## 2021-01-20 NOTE — Patient Outreach (Signed)
[facility]) Care Management  [place]  01/21/2021   Keith Espinoza 1940-12-02 [id number]   Referral Date: 4/12 Referral Source: Hospital Liaison Referral Reason: Post hospital discharge, amputation revision Insurance: Medicare   Outreach attempt #2, successful.  Identity verified.  This care manager introduced self and stated purpose of call.  [facility] care management services explained.    Social: Report he lives alone, wife died about a little more than a week ago.  State he is able to bathe and dress independently while in his wheelchair, does not cook.  State his daughter lives behind him and she as well as family helps to provide meals.  Offered to have Care Guide reach out for Meals on Wheels, declines.    Conditions: Per chart, has history of right BKA in March, recently had his second revision, discharged on 4/11.  Also has history of HTN, PVD, CAD, GERD, DM (A1C - 7.7), and HLD.    Medications: Attempted to review with member, state his daughter and granddaughter help with medication management.  Request speak with daughter to review, member refuses stating "I told you whatever you have on you paper I'm taking."    Appointments: Member was seen by ortho yesterday, will follow up next week to have staples removed.  When questioned about follow up with PCP state "I don't see a reason to go waste money when he has nothing to do with my leg."  Management of chronic conditions by PCP explained, he verbalizes understanding but again state he will not schedule appointment.  Daughter has been providing transportation.  Advance Directives: Report daughter is POA, does not want to change anything at this time.  Consent: Although he is reluctant to provide information (state some of the assessment questions are "stupid", does grant permission to engage in [facility] services and follow up.  Encounter Medications:  Outpatient Encounter Medications as of 01/20/2021   Medication Sig Note  . acetaminophen (TYLENOL) 325 MG tablet Take 1-2 tablets (325-650 mg total) by mouth every 4 (four) hours as needed for mild pain. (Patient not taking: No sig reported) 01/10/2021: Pt buys extra strength tylenol - 500 mg  . acetaminophen (TYLENOL) 500 MG tablet Take 1,000 mg by mouth every 6 (six) hours as needed (pain).   Marland Kitchen allopurinol (ZYLOPRIM) 300 MG tablet Take 300 mg by mouth daily.   Marland Kitchen amLODipine (NORVASC) 5 MG tablet Take 1 tablet (5 mg total) by mouth daily.   Marland Kitchen aspirin EC 81 MG tablet Take 81 mg by mouth daily.   . Cholecalciferol (VITAMIN D3) 50 MCG (2000 UT) TABS Take 2,000 Units by mouth daily.    . clopidogrel (PLAVIX) 75 MG tablet Take 1 tablet (75 mg total) by mouth daily.   . Cyanocobalamin (B-12) 2500 MCG TABS Take 2,500 mcg by mouth daily.   Marland Kitchen doxycycline (VIBRA-TABS) 100 MG tablet Take 1 tablet (100 mg total) by mouth every 12 (twelve) hours.   . empagliflozin (JARDIANCE) 25 MG TABS tablet Take 25 mg by mouth daily.   Marland Kitchen gabapentin (NEURONTIN) 100 MG capsule Take 2 capsules (200 mg total) by mouth at bedtime. 01/10/2021: Bedtime dose is a total of 600 mg per pt  . gabapentin (NEURONTIN) 400 MG capsule Take 1 capsule (400 mg total) by mouth 3 (three) times daily. 01/10/2021: Bedtime dose is a total of 600 mg per pt  . hydrochlorothiazide (HYDRODIURIL) 12.5 MG tablet Take 1 tablet (12.5 mg total) by mouth daily.   . insulin detemir (  LEVEMIR FLEXTOUCH) 100 UNIT/ML FlexPen Inject 8-18 Units into the skin at bedtime. Dose is based on CBG   . isosorbide mononitrate (IMDUR) 30 MG 24 hr tablet Take 1 tablet (30 mg total) by mouth daily. Please schedule yearly appointment for future refills. Thank you   . losartan (COZAAR) 50 MG tablet Take 1 tablet (50 mg total) by mouth daily.   . metFORMIN (GLUCOPHAGE) 1000 MG tablet Take 1 tablet (1,000 mg total) by mouth 2 (two) times daily.   . methocarbamol (ROBAXIN) 500 MG tablet Take 1 tablet (500 mg total) by mouth every 6 (six)  hours as needed for muscle spasms.   . nitroGLYCERIN (NITROSTAT) 0.4 MG SL tablet Place 0.4 mg under the tongue every 5 (five) minutes x 3 doses as needed for chest pain.    . ONE TOUCH ULTRA TEST test strip CHECK BLOOD SUGAR ONCE DAILY AS DIRECTED   . oxyCODONE (OXY IR/ROXICODONE) 5 MG immediate release tablet Take 1 tablet (5 mg total) by mouth every 4 (four) hours as needed for severe pain.   Marland Kitchen terazosin (HYTRIN) 5 MG capsule Take 1 capsule (5 mg total) by mouth at bedtime.   . traMADol (ULTRAM) 50 MG tablet Take 1 tablet (50 mg total) by mouth every 6 (six) hours as needed for moderate pain.   . traZODone (DESYREL) 50 MG tablet Take 0.5-1 tablets (25-50 mg total) by mouth at bedtime as needed for sleep. (Patient taking differently: Take 50 mg by mouth at bedtime as needed for sleep.)    No facility-administered encounter medications on file as of 01/20/2021.    Functional Status:  In your present state of health, do you have any difficulty performing the following activities: 01/09/2021 01/09/2021  Hearing? - N  Vision? - N  Difficulty concentrating or making decisions? - N  Walking or climbing stairs? - Y  Dressing or bathing? - Y  Doing errands, shopping? N -  Some recent data might be hidden    Fall/Depression Screening: Fall Risk  01/20/2021 11/24/2020 08/26/2020  Falls in the past year? 0 0 0  Number falls in past yr: 0 0 0  Injury with Fall? 0 0 -  Risk for fall due to : Impaired mobility Impaired mobility;Impaired balance/gait Impaired balance/gait;Impaired mobility  Follow up Education provided Falls evaluation completed Falls evaluation completed;Falls prevention discussed   PHQ 2/9 Scores 01/20/2021 10/12/2019  PHQ - 2 Score 0 0    Assessment:  Goals Addressed            This Visit's Progress   . ( THN)Monitor and Manage My Blood Sugar   On track    Timeframe:  Long-Range Goal Priority:  Medium Start Date:  72094709                        Expected End Date:  [id number]                     Follow Up Date [id number]   - check blood sugar at prescribed times - check blood sugar if I feel it is too high or too low - enter blood sugar readings and medication or insulin into daily log - take the blood sugar log to all doctor visits - take the blood sugar meter to all doctor visits    Why is this important?   Checking your blood sugar at home helps to keep it from getting very high or very low.  Writing  the results in a diary or log helps the doctor know how to care for you.  Your blood sugar log should have the time, date and the results.  Also, write down the amount of insulin or other medicine that you take.  Other information, like what you ate, exercise done and how you were feeling, will also be helpful.     Notes:   4/19 -     . [facility]) Make and Keep All Appointments   On track    Timeframe:  Long-Range Goal Priority:  Medium Start Date:   24825003                          Expected End Date:   [id number]                   Follow Up Date [id number]   - call to cancel if needed - keep a calendar with prescription refill dates - keep a calendar with appointment dates    Why is this important?   Part of staying healthy is seeing the doctor for follow-up care.  If you forget your appointments, there are some things you can do to stay on track.    Notes:  Need to schedule exam  4/19 - Reviewed follow up scheduled with member, had visit with ortho on yesterday, repeat next week    . (THN)Perform Foot Care   On track    Timeframe:  Long-Range Goal Priority:  Medium Start Date:  82800349                           Expected End Date:     [id number]                 Follow Up Date [id number]   - check feet daily for cuts, sores or redness - keep feet up while sitting - trim toenails straight across - wash and dry feet carefully every day - wear comfortable, cotton socks - wear comfortable, well-fitting shoes    Why is this important?   Good foot care is  very important when you have diabetes.  There are many things you can do to keep your feet healthy and catch a problem early.    Notes:  Patient has ulcers on toes. Great toe has lost its toenail and is now draining [id number] foot is healing some  4/19 - Patient had right BKA, discussed stump care and follow up with MD    . (THN)THN Set My Target A1C   On track    Timeframe:  Long-Range Goal Priority:  High Start Date:  [id number]                           Expected End Date:    92010071                  Follow Up Date [id number]   - set target A1C    Why is this important?   Your target A1C is decided together by you and your doctor.  It is based on several things like your age and other health issues.    Notes: 7.0  4/19 - Patient aware of proper diet to manage A1C as well as adherence to medication regime       Plan:  Follow-up:  Patient agrees to Care Plan and Follow-up.  Will  send education regarding stump care after amputation.  Will notify PCP of [facility] involvement.  Will follow up within the next 2 weeks.  Valente David, [place], MSN [place] [phone]

## 2021-01-21 ENCOUNTER — Encounter (HOSPITAL_COMMUNITY): Payer: Medicare Other

## 2021-01-21 ENCOUNTER — Ambulatory Visit: Payer: Medicare Other | Admitting: Vascular Surgery

## 2021-01-21 NOTE — Patient Instructions (Signed)
Leg Amputation, Care After This sheet gives you information about how to care for yourself after your procedure. Your health care provider may also give you more specific instructions. If you have problems or questions, contact your health care provider. What can I expect after the procedure? After the procedure, it is common to have:  A little blood or fluid coming from your incision.  Pain from your incision.  Pain that feels like it is coming from the leg that has been removed (phantom pain). This can last for a year or longer.  Skin breakdown on your stump (residual limb).  Feelings of depression, anxiety, and fear. Follow these instructions at home: Medicines  Take over-the-counter and prescription medicines only as told by your health care provider.  If you were prescribed an antibiotic medicine, take it as told by your health care provider. Do not stop taking the antibiotic even if you start to feel better. Bathing  Do not take baths, swim, use a hot tub, or get your residual limb wet until your health care provider approves. You may only be allowed to take sponge baths.  Ask your health care provider when you may start taking showers. After taking a shower, make sure to rinse and dry your residual limb carefully. Incision care  Check your residual limb, especially your incision area, every day. Check for: ? More redness, swelling, or pain. ? More fluid or blood. ? Warmth. ? Pus or a bad smell. ? Blisters. ? Scrapes.  Follow instructions from your health care provider about how to take care of your incision. Make sure you: ? Wash your hands with soap and water before you change your bandage (dressing). If soap and water are not available, use hand sanitizer. ? Change your dressing as told by your health care provider. ? Leave stitches (sutures), skin glue, or adhesive strips in place. These skin closures may need to stay in place for 2 weeks or longer. If adhesive strip  edges start to loosen and curl up, you may trim the loose edges. Do not remove adhesive strips completely unless your health care provider tells you to do that.   Activity  Return to your normal activities as told by your health care provider. Ask your health care provider what activities are safe for you.  Do physical therapy exercises as told by your health care provider.  If you have been fitted with an artificial leg (prosthesis) or have been given crutches, use them as told by your health care provider. Eating and drinking  Eat a healthy diet that includes whole grains, fruits and vegetables, low-fat dairy products, and lean proteins.  Drink enough fluid to keep your urine pale yellow. Driving  Work with an occupational therapist to learn new strategies for safe driving with an amputation.  Do not drive or use heavy equipment while taking prescription pain medicine. General instructions  To prevent or treat constipation while you are taking prescription pain medicine, your health care provider may recommend that you: ? Drink enough fluid to keep your urine pale yellow. ? Take over-the-counter or prescription medicines. ? Eat foods that are high in fiber, such as fresh fruits and vegetables, whole grains, and beans. ? Limit foods that are high in fat and processed sugars, such as fried and sweet foods.  Do not use oils, lotion, cream, or rubbing alcohol on the remaining part of your leg.  Wear compression stockings as told by your health care provider.  If you have trouble  coping with your amputation, contact your health care provider. Some feelings of depression, anxiety, or fear are normal after an amputation, but if you struggle with these feelings or if they get overwhelming, your provider may be able to recommend a therapist or support group to help you.  Do not use any products that contain nicotine or tobacco, such as cigarettes and e-cigarettes. These can delay bone  healing. If you need help quitting, ask your health care provider.  Keep all follow-up visits as told by your health care provider. This is important. Contact a health care provider if:  You have a fever.  You have more tenderness in your residual limb.  You have a rash or itchy skin.  You have a cough or chills and you feel achy and weak.  You have trouble coping with your amputation.  You have blisters or scrapes on your residual limb. Get help right away if:  You have severe pain in your residual limb.  You have more redness, swelling, or pain around your incision.  You have more fluid or blood coming from your incision.  Your incision feels warm to the touch, tender, and painful.  You have pus or a bad smell coming from your incision.  You feel light-headed and have shortness of breath.  You have blood-soaked bandages.  You cough up blood.  You have chest pain or pain when taking a deep breath or coughing. If you have these symptoms, do not drive yourself to the hospital. Call emergency services right away. If you ever feel like you may hurt yourself or others, or have thoughts about taking your own life, get help right away. You can go to your nearest emergency department or call:  Your local emergency services (911 in the [place]  A suicide crisis helpline, such as the [place] at 223-309-0720. This is open 24 hours a day. Summary  After a leg amputation, you may have pain that feels like it is coming from the leg that was removed (phantom pain). This can last for a year or longer.  Follow instructions from your health care provider about how to take care of your incision.  Check your residual limb, especially your incision area, every day. More redness, swelling, or pain may be a sign of infection.  Contact your health care provider if you have trouble coping with your amputation. This information is not intended to replace advice  given to you by your health care provider. Make sure you discuss any questions you have with your health care provider. Document Revised: 01/03/2020 Document Reviewed: 01/03/2020 Elsevier Patient Education  2021 [place] With an Amputation An amputation is a surgical procedure to remove all or part of an arm or leg (limb). After this surgery, it will take time for you to heal and get used to living with the amputation. There are things you can do to help yourself adjust. Living with an amputation can be challenging, but it is often possible to do all of the activities that you used to do. How to manage lifestyle changes  Return to your normal activities as told by your health care provider. Ask your health care provider what activities are safe for you.  You may choose to have an artificial limb (prosthesis) made for you. Use and care for your prosthesis as told by your health care provider.  Discuss all of your leisure interests with your health care provider and prosthesis specialist. Equipment changes can often  be made, which may allow you to return to a sport or hobby. Some Teacher, English as a foreign language for this purpose.  Talk with your health care team about returning to work. ? When you are ready to return to work, your therapists can evaluate your job site and make recommendations to help you do your job. ? If you are not able to return to the same job, Futures trader of Vocational Rehabilitation can help you with job retraining.  Talk with your health care provider about returning to driving. You may: ? Work with an occupational therapist to learn new ways for safely driving with an amputation. ? Work with a prosthesis specialist or physical therapist to identify assistive devices for safe driving.      How to recognize stress Some common challenges of living with an amputation may bring about stress. These issues are normal. They include:  Changes in how you  move around.  Changes in how you care for yourself.  Changes in how you participate in leisure activities.  Emotions after the amputation, such as grief and anger.  Issues with body image and weight.  Problems with pain and treatment. How to manage stress Use these strategies to ease stress related to the daily challenges of living with an amputation:  Be aware of any sources of your stress. Monitor yourself for symptoms of stress. Identify what challenges cause the most stress for you.  Rethink the situation. Try to: ? Think realistically about stressful challenges. Do not ignore these challenges. Do not overreact to them. ? Try to find the positives in a stressful situation. Do not focus on the negatives.  Talk about your emotional challenges, such as grief, with a mental health professional.  Connect with other people who have gone through the same experience.  Find ways to help relax your body and mind. Examples include: ? Meditation, deep breathing, or progressive relaxation techniques. ? Hypnosis or guided imagery. ? Yoga or tai chi. ? Biofeedback or mindfulness techniques. ? Keeping a journal. ? Doing hobbies, like listening to music or being out in nature. ? Exercise. Talk with your rehabilitation team about the best way to get 30 minutes of physical activity at least 5 days a week. Where to find support: Talking to others  Look for a local or online support group for people living with an amputation. Finances  Talk to your insurance company about what assistive devices your plan covers.  Contact national or local amputee charities to find out if you are eligible for scholarships and medical equipment for people in need.  People in [employer] service may be eligible for financial assistance or programs that support amputees. Rehabilitation A rehabilitation program can help you regain mobility and independence. Your rehabilitation team may include:  Physicians and  nurses.  Physical and occupational therapists.  Prosthesis specialists.  Social workers.  Mental health professionals.  Diet and nutrition specialists. Follow these instructions at home: Managing pain  Work with your health care provider to manage any residual or phantom limb pain. This may include: ? Pain medicine. ? Physical therapy. ? Techniques that help retrain the brain and nervous system (movement representation techniques). ? Control and instrumentation engineer. For this treatment, stimulation is applied to different parts of your stump, and you describe what you feel. ? Biofeedback. This involves using monitors that alert you to changes in your breathing, heart rate, skin temperature, or muscle activity, and using relaxation techniques to reverse those changes. ? Acupuncture. Eating and drinking  Work with a dietitian to develop an eating plan that helps you maintain a healthy body weight. Your plan may include a balance of: ? Fresh fruits and vegetables. ? Whole grains. ? Lean meats. ? Low-fat dairy. ? Healthy fats, such as fish, nuts, avocado, or olive oil.  Do not drink alcohol to cope with stress. General instructions  Take over-the-counter and prescription medicines only as told by your health care provider.  Do not use any products that contain nicotine or tobacco, such as cigarettes and e-cigarettes. If you need help quitting, ask your health care provider.  Keep all follow-up visits as told by your health care provider. This is important. Where to find more information You can find more information about living with an amputation from:  Amputee Coalition: www.amputee-coalition.org  Amputee Support Groups: amputee.supportgroups.[place]: www.nationalamputation.[facility] on Health, Physical Activity and Disability: www.nchpad.org  [employer] [place]: www.disabledsportsusa.org Contact a health care provider if:  Your  pain does not improve with medicine or treatment.  You have trouble managing your emotions about losing an extremity. Get help right away if you:  Have severe pain. Summary  It will take time for you to heal and get used to living with an amputation.  Look for a local or online support group for people living with an amputation.  Work with your health care provider to manage any phantom limb pain. This information is not intended to replace advice given to you by your health care provider. Make sure you discuss any questions you have with your health care provider. Document Revised: 01/03/2020 Document Reviewed: 01/03/2020 Elsevier Patient Education  2021 [employer].

## 2021-01-22 ENCOUNTER — Encounter: Payer: Medicare Other | Admitting: Orthopedic Surgery

## 2021-01-22 DIAGNOSIS — E1151 Type 2 diabetes mellitus with diabetic peripheral angiopathy without gangrene: Secondary | ICD-10-CM | POA: Diagnosis not present

## 2021-01-22 DIAGNOSIS — S81001D Unspecified open wound, right knee, subsequent encounter: Secondary | ICD-10-CM | POA: Diagnosis not present

## 2021-01-22 DIAGNOSIS — E1165 Type 2 diabetes mellitus with hyperglycemia: Secondary | ICD-10-CM | POA: Diagnosis not present

## 2021-01-22 DIAGNOSIS — K5909 Other constipation: Secondary | ICD-10-CM | POA: Diagnosis not present

## 2021-01-22 DIAGNOSIS — Z4781 Encounter for orthopedic aftercare following surgical amputation: Secondary | ICD-10-CM | POA: Diagnosis not present

## 2021-01-22 DIAGNOSIS — I2510 Atherosclerotic heart disease of native coronary artery without angina pectoris: Secondary | ICD-10-CM | POA: Diagnosis not present

## 2021-01-22 DIAGNOSIS — E1140 Type 2 diabetes mellitus with diabetic neuropathy, unspecified: Secondary | ICD-10-CM | POA: Diagnosis not present

## 2021-01-22 DIAGNOSIS — M545 Low back pain, unspecified: Secondary | ICD-10-CM | POA: Diagnosis not present

## 2021-01-22 DIAGNOSIS — Z89511 Acquired absence of right leg below knee: Secondary | ICD-10-CM | POA: Diagnosis not present

## 2021-01-22 DIAGNOSIS — G8929 Other chronic pain: Secondary | ICD-10-CM | POA: Diagnosis not present

## 2021-01-22 DIAGNOSIS — I10 Essential (primary) hypertension: Secondary | ICD-10-CM | POA: Diagnosis not present

## 2021-01-27 ENCOUNTER — Ambulatory Visit (INDEPENDENT_AMBULATORY_CARE_PROVIDER_SITE_OTHER): Payer: Medicare Other | Admitting: Physician Assistant

## 2021-01-27 ENCOUNTER — Encounter: Payer: Self-pay | Admitting: Orthopedic Surgery

## 2021-01-27 DIAGNOSIS — E1151 Type 2 diabetes mellitus with diabetic peripheral angiopathy without gangrene: Secondary | ICD-10-CM | POA: Diagnosis not present

## 2021-01-27 DIAGNOSIS — I2510 Atherosclerotic heart disease of native coronary artery without angina pectoris: Secondary | ICD-10-CM | POA: Diagnosis not present

## 2021-01-27 DIAGNOSIS — Z89511 Acquired absence of right leg below knee: Secondary | ICD-10-CM | POA: Diagnosis not present

## 2021-01-27 DIAGNOSIS — E1165 Type 2 diabetes mellitus with hyperglycemia: Secondary | ICD-10-CM | POA: Diagnosis not present

## 2021-01-27 DIAGNOSIS — Z4781 Encounter for orthopedic aftercare following surgical amputation: Secondary | ICD-10-CM | POA: Diagnosis not present

## 2021-01-27 DIAGNOSIS — M545 Low back pain, unspecified: Secondary | ICD-10-CM | POA: Diagnosis not present

## 2021-01-27 DIAGNOSIS — I10 Essential (primary) hypertension: Secondary | ICD-10-CM | POA: Diagnosis not present

## 2021-01-27 DIAGNOSIS — E1140 Type 2 diabetes mellitus with diabetic neuropathy, unspecified: Secondary | ICD-10-CM | POA: Diagnosis not present

## 2021-01-27 DIAGNOSIS — K5909 Other constipation: Secondary | ICD-10-CM | POA: Diagnosis not present

## 2021-01-27 DIAGNOSIS — G8929 Other chronic pain: Secondary | ICD-10-CM | POA: Diagnosis not present

## 2021-01-27 DIAGNOSIS — S81001D Unspecified open wound, right knee, subsequent encounter: Secondary | ICD-10-CM | POA: Diagnosis not present

## 2021-01-27 NOTE — Progress Notes (Signed)
Office Visit Note   Patient: Keith Espinoza           Date of Birth: 02-19-1941           MRN: [id number] Visit Date: 01/27/2021              Requested by: [name], MD [address] [place],  [place] [address] PCP: [name], MD  Chief Complaint  Patient presents with  . Right Leg - Routine Post Op    01/09/21 revision right BKA       HPI: Patient is in follow-up he is 3 weeks status post revision right below-knee amputation.  His staples have been removed.  He still has a ulcer on the anterior shin which is improving.  He has been wearing a shrinker  Assessment & Plan: Visit Diagnoses: No diagnosis found.  Plan: We will continue with shrinker.  Follow-up for final suture removal in 1  Follow-Up Instructions: No follow-ups on file.   Ortho Exam  Patient is alert, oriented, no adenopathy, well-dressed, normal affect, normal respiratory effort.  Amputation stump well apposed wound edges staples have been removed sutures were removed there was some difficulty as one of the sutures was heavily embedded into the skin.  Once this is removed he did have some bleeding.  This was after he applied his shrinker.  Pressure dressing was applied over this.  His daughter will change this later today.  There is no sign of ascending infection or cellulitis  Imaging: No results found. No images are attached to the encounter.  Labs: Lab Results  Component Value Date   HGBA1C 7.7 (H) 12/12/2020   HGBA1C 6.6 (H) 04/10/2019   HGBA1C 7.0 (H) 07/14/2016   REPTSTATUS 04/18/2019 FINAL 04/13/2019   GRAMSTAIN  04/13/2019    RARE WBC PRESENT, PREDOMINANTLY PMN NO ORGANISMS SEEN    CULT  04/13/2019    No growth aerobically or anaerobically. Performed at [facility], [place] [address] [place], [place] [address]      Lab Results  Component Value Date   ALBUMIN 2.9 (L) 12/17/2020   ALBUMIN 4.4 04/10/2019   ALBUMIN 4.2 07/14/2016    No results found for: MG No  results found for: VD25OH  No results found for: PREALBUMIN CBC EXTENDED Latest Ref Rng & Units 01/11/2021 01/10/2021 01/09/2021  WBC 4.0 - 10.5 K/uL 7.7 9.2 6.7  RBC 4.22 - 5.81 MIL/uL 3.65(L) 3.48(L) 4.12(L)  HGB 13.0 - 17.0 g/dL 11.0(L) 10.6(L) 12.3(L)  HCT 39.0 - 52.0 % 32.5(L) 30.8(L) 37.2(L)  PLT 150 - 400 K/uL 135(L) 162 171  NEUTROABS 1.7 - 7.7 K/uL - - -  LYMPHSABS 0.7 - 4.0 K/uL - - -     There is no height or weight on file to calculate BMI.  Orders:  No orders of the defined types were placed in this encounter.  No orders of the defined types were placed in this encounter.    Procedures: No procedures performed  Clinical Data: No additional findings.  ROS:  All other systems negative, except as noted in the HPI. Review of Systems  Objective: Vital Signs: There were no vitals taken for this visit.  Specialty Comments:  No specialty comments available.  PMFS History: Patient Active Problem List   Diagnosis Date Noted  . Wound dehiscence 01/09/2021  . Dehiscence of amputation stump ([place])   . Status post percutaneous transluminal coronary angioplasty 01/06/2021  . Type II diabetes mellitus, uncontrolled ([place]) 01/06/2021  .  Acute pancreatitis 01/06/2021  . Diabetic peripheral vascular disease ([place]) 01/06/2021  . Encounter for screening for other disorder 01/06/2021  . Enlarged prostate 01/06/2021  . Foot ulcer, right ([place]) 01/06/2021  . Gout 01/06/2021  . Headache 01/06/2021  . Neck pain 01/06/2021  . Hypoglycemia 01/06/2021  . Loss of appetite 01/06/2021  . 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 Peripheral neuropathy 01/06/2021  . Sciatica 01/06/2021  . Vitamin B12 deficiency 01/06/2021  . Vitamin D deficiency 01/06/2021  . Weakness 01/06/2021  . Diabetes mellitus type 2 with neurological manifestations ([place]) 01/06/2021  . Protein-calorie malnutrition, severe 12/26/2020  . Acute blood loss anemia 12/26/2020  . Prerenal azotemia 12/26/2020  .  Below-knee amputation of right lower extremity ([place]) 12/12/2020  . Gangrene of right foot ([place])   . Diabetic neuropathy ([place]) 11/17/2020  . Hyperglycemia due to type 2 diabetes mellitus ([place]) 11/17/2020  . Long term (current) use of insulin ([place]) 11/17/2020  . Obesity 11/17/2020  . 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/2019  . Hip dislocation, right ([place]) 04/13/2019  . Other intervertebral disc degeneration, lumbar region 03/30/2019  . CAD (coronary artery disease) 01/30/2019  . Tobacco abuse 01/30/2019  . 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  . Burn, foot, second degree, left, initial encounter 06/08/2017  . 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 S/P total knee arthroplasty, left 10/26/2016  . Hyperlipidemia 09/04/2014  . Thrombocytopenia ([place])   . Precordial chest pain 04/05/2014  . Coronary atherosclerosis of native coronary artery 10/01/2013  . Other and unspecified hyperlipidemia 10/01/2013  . Essential hypertension, benign 10/01/2013  . Insulin dependent type 2 diabetes mellitus ([place]) 10/01/2013  . Esophageal reflux 10/01/2013  . Hypertrophy of prostate without urinary obstruction and other lower urinary tract symptoms (LUTS) 10/01/2013   Past Medical History:  Diagnosis Date  . Allergic rhinitis   . Allergic rhinitis   . Arthritis   . Basal cell carcinoma 11/01/2019    bcc left chest treatment TX [id number] [id number]   . Chronic leg pain    right  . Chronic lower back pain   . Coronary artery disease    a. Stenting to RCA 2004; staged DES to LAD and Cx 2004. DES to mRCA 2012. b. DES to mCx, PTCA to dCx 11/2011. c. Lateral wall MI 2013 s/p PTCA to distal Cx & DES to mid OM2 11/2011. d. Low risk nuc 04/2014, EF wnl.  Marland Kitchen COVID-19   . Diabetes mellitus    Insulin dependent  . Diabetic neuropathy (H[facility])    MILD  . Diverticulosis   . Dysrhythmia   . Gilbert syndrome   . Gout    right wrist; right foot; right elbow; have had it since 1970's  . H/O hiatal  hernia   . Heart murmur   . History of echocardiogram    aortic sclerosis per echo 12/09 EF 65%, otherwise normal  . History of hemorrhoids    BLEEDING  . History of kidney stones    h/o  . Hypertension    Diagnosed 1995   . Myocardial infarction ([place])   . Pancreatic pseudocyst    a. s/p remote drainage 2006.  Marland Kitchen Thrombocytopenia ([place])    Seen on oldest labs in system from 2004  . Vitamin B 12 deficiency    orally replaced    Family History  Problem Relation Age of Onset  . Diabetes Mother   . Hyperlipidemia Mother   . Hypertension Mother   . Cancer Father   .  Hypertension Father   . Diabetes Sister   . Hypertension Sister   . Cancer Brother   . Heart attack Neg Hx     Past Surgical History:  Procedure Laterality Date  . ABDOMINAL AORTOGRAM W/LOWER EXTREMITY Bilateral 08/08/2020   Procedure: ABDOMINAL AORTOGRAM W/LOWER EXTREMITY;  Surgeon: Angelia Mould, MD;  Location: [facility];  Service: Cardiovascular;  Laterality: Bilateral;  . AMPUTATION Right 12/12/2020   Procedure: RIGHT BELOW KNEE AMPUTATION;  Surgeon: [name], MD;  Location: [facility];  Service: Orthopedics;  Laterality: Right;  . BACK SURGERY     "total of 3 times" S/P fall   . CARPAL TUNNEL RELEASE Bilateral   . CHOLECYSTECTOMY  1990's  . COLONOSCOPY    . CORONARY ANGIOPLASTY  11/11/11  . CORONARY ANGIOPLASTY WITH STENT PLACEMENT  09/30/2011   "1 then; makes a total of 4"  . CORONARY ANGIOPLASTY WITH STENT PLACEMENT  11/11/11   "1; makes a total of 5"  . INGUINAL HERNIA REPAIR  2003   right  . JOINT REPLACEMENT Right 04/03/2002   hip replacment  . KNEE ARTHROSCOPY  1990's   left  . LEFT HEART CATHETERIZATION WITH CORONARY ANGIOGRAM N/A 09/30/2011   Procedure: LEFT HEART CATHETERIZATION WITH CORONARY ANGIOGRAM;  Surgeon: Jettie Booze, MD;  Location: [facility] LAB;  Service: Cardiovascular;  Laterality: N/A;  possible PCI  . LEFT HEART CATHETERIZATION WITH CORONARY ANGIOGRAM N/A  11/15/2011   Procedure: LEFT HEART CATHETERIZATION WITH CORONARY ANGIOGRAM;  Surgeon: Jettie Booze, MD;  Location: [facility] CATH LAB;  Service: Cardiovascular;  Laterality: N/A;  . PERCUTANEOUS CORONARY STENT INTERVENTION (PCI-S)  09/30/2011   Procedure: PERCUTANEOUS CORONARY STENT INTERVENTION (PCI-S);  Surgeon: Jettie Booze, MD;  Location[facility] C[facility] LAB;  Service: Cardiovascular;;  . PERCUTANEOUS CORONARY STENT INTERVENTION (PCI-S) N/A 11/11/2011   Procedure: PERCUTANEOUS CORONARY STENT INTERVENTION (PCI-S);  Surgeon: Jettie Booze, MD;  Location: [facility] CATH LAB;  Service: Cardiovascular;  Laterality: N/A;  . PERIPHERAL VASCULAR BALLOON ANGIOPLASTY Right 08/08/2020   Procedure: PERIPHERAL VASCULAR BALLOON ANGIOPLASTY;  Surgeon: Angelia Mould, MD;  Location: [facility];  Service: Cardiovascular;  Laterality: Right;  Posterior tibial   . SHOULDER SURGERY Right    X 2  . STUMP REVISION Right 01/09/2021   Procedure: REVISION RIGHT BELOW KNEE AMPUTATION;  Surgeon: [name], MD;  Location: [facility];  Service: Orthopedics;  Laterality: Right;  . TONSILLECTOMY  ~ 1948  . TOTAL HIP REVISION Right 04/13/2019   Procedure: RIGHT TOTAL HIP REVISION-POSTERIOR  APPROACH LATERAL;  Surgeon: Marybelle Killings, MD;  Location: [facility];  Service: Orthopedics;  Laterality: Right;  . TOTAL KNEE ARTHROPLASTY Left 07/23/2016   Procedure: LEFT TOTAL KNEE ARTHROPLASTY;  Surgeon: Marybelle Killings, MD;  Location: [facility];  Service: Orthopedics;  Laterality: Left;   Social History   Occupational History  . Occupation: Retired  Tobacco Use  . Smoking status: Former Research scientist (life sciences)  . Smokeless tobacco: Current User    Types: Chew  . Tobacco comment: quit 60 years ago  Vaping Use  . Vaping Use: Never used  Substance and Sexual Activity  . Alcohol use: No  . Drug use: No  . Sexual activity: Not Currently

## 2021-01-28 DIAGNOSIS — M545 Low back pain, unspecified: Secondary | ICD-10-CM | POA: Diagnosis not present

## 2021-01-28 DIAGNOSIS — K5909 Other constipation: Secondary | ICD-10-CM | POA: Diagnosis not present

## 2021-01-28 DIAGNOSIS — S81001D Unspecified open wound, right knee, subsequent encounter: Secondary | ICD-10-CM | POA: Diagnosis not present

## 2021-01-28 DIAGNOSIS — I10 Essential (primary) hypertension: Secondary | ICD-10-CM | POA: Diagnosis not present

## 2021-01-28 DIAGNOSIS — Z4781 Encounter for orthopedic aftercare following surgical amputation: Secondary | ICD-10-CM | POA: Diagnosis not present

## 2021-01-28 DIAGNOSIS — E785 Hyperlipidemia, unspecified: Secondary | ICD-10-CM | POA: Diagnosis not present

## 2021-01-28 DIAGNOSIS — E1151 Type 2 diabetes mellitus with diabetic peripheral angiopathy without gangrene: Secondary | ICD-10-CM | POA: Diagnosis not present

## 2021-01-28 DIAGNOSIS — G8929 Other chronic pain: Secondary | ICD-10-CM | POA: Diagnosis not present

## 2021-01-28 DIAGNOSIS — I25118 Atherosclerotic heart disease of native coronary artery with other forms of angina pectoris: Secondary | ICD-10-CM | POA: Diagnosis not present

## 2021-01-28 DIAGNOSIS — E1140 Type 2 diabetes mellitus with diabetic neuropathy, unspecified: Secondary | ICD-10-CM | POA: Diagnosis not present

## 2021-01-28 DIAGNOSIS — Z89511 Acquired absence of right leg below knee: Secondary | ICD-10-CM | POA: Diagnosis not present

## 2021-01-28 DIAGNOSIS — E1342 Other specified diabetes mellitus with diabetic polyneuropathy: Secondary | ICD-10-CM | POA: Diagnosis not present

## 2021-01-28 DIAGNOSIS — K219 Gastro-esophageal reflux disease without esophagitis: Secondary | ICD-10-CM | POA: Diagnosis not present

## 2021-01-28 DIAGNOSIS — N400 Enlarged prostate without lower urinary tract symptoms: Secondary | ICD-10-CM | POA: Diagnosis not present

## 2021-01-28 DIAGNOSIS — E1165 Type 2 diabetes mellitus with hyperglycemia: Secondary | ICD-10-CM | POA: Diagnosis not present

## 2021-01-28 DIAGNOSIS — I2510 Atherosclerotic heart disease of native coronary artery without angina pectoris: Secondary | ICD-10-CM | POA: Diagnosis not present

## 2021-01-29 DIAGNOSIS — K5909 Other constipation: Secondary | ICD-10-CM | POA: Diagnosis not present

## 2021-01-29 DIAGNOSIS — I2510 Atherosclerotic heart disease of native coronary artery without angina pectoris: Secondary | ICD-10-CM | POA: Diagnosis not present

## 2021-01-29 DIAGNOSIS — G8929 Other chronic pain: Secondary | ICD-10-CM | POA: Diagnosis not present

## 2021-01-29 DIAGNOSIS — Z89511 Acquired absence of right leg below knee: Secondary | ICD-10-CM | POA: Diagnosis not present

## 2021-01-29 DIAGNOSIS — E1165 Type 2 diabetes mellitus with hyperglycemia: Secondary | ICD-10-CM | POA: Diagnosis not present

## 2021-01-29 DIAGNOSIS — Z4781 Encounter for orthopedic aftercare following surgical amputation: Secondary | ICD-10-CM | POA: Diagnosis not present

## 2021-01-29 DIAGNOSIS — I10 Essential (primary) hypertension: Secondary | ICD-10-CM | POA: Diagnosis not present

## 2021-01-29 DIAGNOSIS — S81001D Unspecified open wound, right knee, subsequent encounter: Secondary | ICD-10-CM | POA: Diagnosis not present

## 2021-01-29 DIAGNOSIS — E1140 Type 2 diabetes mellitus with diabetic neuropathy, unspecified: Secondary | ICD-10-CM | POA: Diagnosis not present

## 2021-01-29 DIAGNOSIS — M545 Low back pain, unspecified: Secondary | ICD-10-CM | POA: Diagnosis not present

## 2021-01-29 DIAGNOSIS — E1151 Type 2 diabetes mellitus with diabetic peripheral angiopathy without gangrene: Secondary | ICD-10-CM | POA: Diagnosis not present

## 2021-01-30 ENCOUNTER — Other Ambulatory Visit: Payer: Self-pay

## 2021-01-30 ENCOUNTER — Emergency Department (HOSPITAL_BASED_OUTPATIENT_CLINIC_OR_DEPARTMENT_OTHER)
Admission: EM | Admit: 2021-01-30 | Discharge: 2021-01-30 | Disposition: A | Discharge status: Home / Self Care | Payer: Medicare Other | Source: Home / Self Care | Attending: Emergency Medicine | Admitting: Emergency Medicine

## 2021-01-30 ENCOUNTER — Emergency Department (HOSPITAL_BASED_OUTPATIENT_CLINIC_OR_DEPARTMENT_OTHER): Payer: Medicare Other | Admitting: Radiology

## 2021-01-30 DIAGNOSIS — I2510 Atherosclerotic heart disease of native coronary artery without angina pectoris: Secondary | ICD-10-CM | POA: Insufficient documentation

## 2021-01-30 DIAGNOSIS — I10 Essential (primary) hypertension: Secondary | ICD-10-CM | POA: Diagnosis not present

## 2021-01-30 DIAGNOSIS — F17220 Nicotine dependence, chewing tobacco, uncomplicated: Secondary | ICD-10-CM | POA: Insufficient documentation

## 2021-01-30 DIAGNOSIS — E1165 Type 2 diabetes mellitus with hyperglycemia: Secondary | ICD-10-CM | POA: Diagnosis not present

## 2021-01-30 DIAGNOSIS — K5641 Fecal impaction: Secondary | ICD-10-CM | POA: Diagnosis not present

## 2021-01-30 DIAGNOSIS — E1140 Type 2 diabetes mellitus with diabetic neuropathy, unspecified: Secondary | ICD-10-CM | POA: Insufficient documentation

## 2021-01-30 DIAGNOSIS — Z4781 Encounter for orthopedic aftercare following surgical amputation: Secondary | ICD-10-CM | POA: Diagnosis not present

## 2021-01-30 DIAGNOSIS — N400 Enlarged prostate without lower urinary tract symptoms: Secondary | ICD-10-CM | POA: Diagnosis not present

## 2021-01-30 DIAGNOSIS — G8929 Other chronic pain: Secondary | ICD-10-CM | POA: Diagnosis not present

## 2021-01-30 DIAGNOSIS — Z20822 Contact with and (suspected) exposure to covid-19: Secondary | ICD-10-CM | POA: Diagnosis not present

## 2021-01-30 DIAGNOSIS — Z955 Presence of coronary angioplasty implant and graft: Secondary | ICD-10-CM | POA: Diagnosis not present

## 2021-01-30 DIAGNOSIS — Z96652 Presence of left artificial knee joint: Secondary | ICD-10-CM | POA: Insufficient documentation

## 2021-01-30 DIAGNOSIS — R109 Unspecified abdominal pain: Secondary | ICD-10-CM | POA: Diagnosis not present

## 2021-01-30 DIAGNOSIS — E538 Deficiency of other specified B group vitamins: Secondary | ICD-10-CM | POA: Diagnosis present

## 2021-01-30 DIAGNOSIS — I5032 Chronic diastolic (congestive) heart failure: Secondary | ICD-10-CM | POA: Diagnosis not present

## 2021-01-30 DIAGNOSIS — J309 Allergic rhinitis, unspecified: Secondary | ICD-10-CM | POA: Diagnosis present

## 2021-01-30 DIAGNOSIS — E1142 Type 2 diabetes mellitus with diabetic polyneuropathy: Secondary | ICD-10-CM | POA: Diagnosis not present

## 2021-01-30 DIAGNOSIS — M109 Gout, unspecified: Secondary | ICD-10-CM | POA: Diagnosis present

## 2021-01-30 DIAGNOSIS — Z96641 Presence of right artificial hip joint: Secondary | ICD-10-CM | POA: Diagnosis present

## 2021-01-30 DIAGNOSIS — E1151 Type 2 diabetes mellitus with diabetic peripheral angiopathy without gangrene: Secondary | ICD-10-CM | POA: Diagnosis not present

## 2021-01-30 DIAGNOSIS — Z720 Tobacco use: Secondary | ICD-10-CM | POA: Diagnosis not present

## 2021-01-30 DIAGNOSIS — E1169 Type 2 diabetes mellitus with other specified complication: Secondary | ICD-10-CM | POA: Diagnosis not present

## 2021-01-30 DIAGNOSIS — Z049 Encounter for examination and observation for unspecified reason: Secondary | ICD-10-CM | POA: Diagnosis not present

## 2021-01-30 DIAGNOSIS — Z85828 Personal history of other malignant neoplasm of skin: Secondary | ICD-10-CM | POA: Insufficient documentation

## 2021-01-30 DIAGNOSIS — Z794 Long term (current) use of insulin: Secondary | ICD-10-CM | POA: Diagnosis not present

## 2021-01-30 DIAGNOSIS — Z89511 Acquired absence of right leg below knee: Secondary | ICD-10-CM | POA: Diagnosis not present

## 2021-01-30 DIAGNOSIS — K5909 Other constipation: Secondary | ICD-10-CM | POA: Diagnosis not present

## 2021-01-30 DIAGNOSIS — Z8616 Personal history of COVID-19: Secondary | ICD-10-CM | POA: Insufficient documentation

## 2021-01-30 DIAGNOSIS — M545 Low back pain, unspecified: Secondary | ICD-10-CM | POA: Diagnosis not present

## 2021-01-30 DIAGNOSIS — K5900 Constipation, unspecified: Secondary | ICD-10-CM | POA: Diagnosis not present

## 2021-01-30 DIAGNOSIS — I110 Hypertensive heart disease with heart failure: Secondary | ICD-10-CM | POA: Diagnosis not present

## 2021-01-30 DIAGNOSIS — E876 Hypokalemia: Secondary | ICD-10-CM | POA: Diagnosis not present

## 2021-01-30 DIAGNOSIS — Z888 Allergy status to other drugs, medicaments and biological substances status: Secondary | ICD-10-CM | POA: Diagnosis not present

## 2021-01-30 DIAGNOSIS — K219 Gastro-esophageal reflux disease without esophagitis: Secondary | ICD-10-CM | POA: Diagnosis not present

## 2021-01-30 DIAGNOSIS — I252 Old myocardial infarction: Secondary | ICD-10-CM | POA: Diagnosis not present

## 2021-01-30 MED ORDER — POLYETHYLENE GLYCOL 3350 17 G PO PACK: 17.0000 g | PACK | Freq: Every day | ORAL | 0 refills | Status: DC

## 2021-01-30 MED ORDER — SENNOSIDES-DOCUSATE SODIUM 8.6-50 MG PO TABS: 1.0000 | ORAL_TABLET | Freq: Every evening | ORAL | 0 refills | Status: DC | PRN
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
## 2021-01-30 NOTE — ED Triage Notes (Signed)
Pt to ED from home with c/o constipation x2 days. Pt states he has hx of irregular BM but fells the urge to defecate but is unable to pass decent stool.

## 2021-01-30 NOTE — Discharge Instructions (Addendum)
You were seen in the emergency department today for constipation.  We recommend that you use one or more of the following over-the-counter medications in the order described: °  °1)  Colace (or Dulcolax) 100 mg:  This is a stool softener, and you may take it once or twice a day as needed. °2)  Senna tablets:  This is a bowel stimulant that will help "push" out your stool. It is the next step to add after you have tried a stool softener. °3)  Miralax (powder):  This medication works by drawing additional fluid into your intestines and helps to flush out your stool.  Mix the powder with water or juice according to label instructions.  It may help if the Colace and Senna are not sufficient, but you must be sure to use the recommended amount of water or juice when you mix up the powder. °Remember that narcotic pain medications are constipating, so avoid them or minimize their use.  Drink plenty of fluids. ° °Please return to the Emergency Department immediately if you develop new or worsening symptoms that concern you, such as (but not limited to) fever > 101 degrees, severe abdominal pain, or persistent vomiting. ° °

## 2021-01-31 ENCOUNTER — Inpatient Hospital Stay (HOSPITAL_COMMUNITY)
Admission: EM | Admit: 2021-01-31 | Discharge: 2021-02-03 | DRG: 389 | Disposition: A | Discharge status: Home Health | Payer: Medicare Other | Attending: Internal Medicine | Admitting: Internal Medicine

## 2021-01-31 ENCOUNTER — Emergency Department (HOSPITAL_COMMUNITY): Payer: Medicare Other

## 2021-01-31 ENCOUNTER — Encounter (HOSPITAL_COMMUNITY): Payer: Self-pay | Admitting: Emergency Medicine

## 2021-01-31 ENCOUNTER — Other Ambulatory Visit: Payer: Self-pay

## 2021-01-31 DIAGNOSIS — Z955 Presence of coronary angioplasty implant and graft: Secondary | ICD-10-CM

## 2021-01-31 DIAGNOSIS — E1140 Type 2 diabetes mellitus with diabetic neuropathy, unspecified: Secondary | ICD-10-CM | POA: Diagnosis not present

## 2021-01-31 DIAGNOSIS — N400 Enlarged prostate without lower urinary tract symptoms: Secondary | ICD-10-CM | POA: Diagnosis present

## 2021-01-31 DIAGNOSIS — Z794 Long term (current) use of insulin: Secondary | ICD-10-CM

## 2021-01-31 DIAGNOSIS — I5032 Chronic diastolic (congestive) heart failure: Secondary | ICD-10-CM | POA: Diagnosis present

## 2021-01-31 DIAGNOSIS — Z720 Tobacco use: Secondary | ICD-10-CM

## 2021-01-31 DIAGNOSIS — E876 Hypokalemia: Secondary | ICD-10-CM | POA: Diagnosis not present

## 2021-01-31 DIAGNOSIS — Z96641 Presence of right artificial hip joint: Secondary | ICD-10-CM | POA: Diagnosis present

## 2021-01-31 DIAGNOSIS — Z7982 Long term (current) use of aspirin: Secondary | ICD-10-CM

## 2021-01-31 DIAGNOSIS — Z89512 Acquired absence of left leg below knee: Secondary | ICD-10-CM

## 2021-01-31 DIAGNOSIS — E1151 Type 2 diabetes mellitus with diabetic peripheral angiopathy without gangrene: Secondary | ICD-10-CM | POA: Diagnosis not present

## 2021-01-31 DIAGNOSIS — Z96652 Presence of left artificial knee joint: Secondary | ICD-10-CM | POA: Diagnosis present

## 2021-01-31 DIAGNOSIS — Z89511 Acquired absence of right leg below knee: Secondary | ICD-10-CM

## 2021-01-31 DIAGNOSIS — I2510 Atherosclerotic heart disease of native coronary artery without angina pectoris: Secondary | ICD-10-CM | POA: Diagnosis not present

## 2021-01-31 DIAGNOSIS — Z049 Encounter for examination and observation for unspecified reason: Secondary | ICD-10-CM | POA: Diagnosis not present

## 2021-01-31 DIAGNOSIS — Z4781 Encounter for orthopedic aftercare following surgical amputation: Secondary | ICD-10-CM | POA: Diagnosis not present

## 2021-01-31 DIAGNOSIS — K219 Gastro-esophageal reflux disease without esophagitis: Secondary | ICD-10-CM | POA: Diagnosis present

## 2021-01-31 DIAGNOSIS — I10 Essential (primary) hypertension: Secondary | ICD-10-CM

## 2021-01-31 DIAGNOSIS — K5909 Other constipation: Secondary | ICD-10-CM | POA: Diagnosis not present

## 2021-01-31 DIAGNOSIS — Z83438 Family history of other disorder of lipoprotein metabolism and other lipidemia: Secondary | ICD-10-CM

## 2021-01-31 DIAGNOSIS — E1142 Type 2 diabetes mellitus with diabetic polyneuropathy: Secondary | ICD-10-CM | POA: Diagnosis present

## 2021-01-31 DIAGNOSIS — Z888 Allergy status to other drugs, medicaments and biological substances status: Secondary | ICD-10-CM

## 2021-01-31 DIAGNOSIS — M109 Gout, unspecified: Secondary | ICD-10-CM | POA: Diagnosis present

## 2021-01-31 DIAGNOSIS — K5641 Fecal impaction: Principal | ICD-10-CM | POA: Diagnosis present

## 2021-01-31 DIAGNOSIS — M545 Low back pain, unspecified: Secondary | ICD-10-CM | POA: Diagnosis not present

## 2021-01-31 DIAGNOSIS — E1165 Type 2 diabetes mellitus with hyperglycemia: Secondary | ICD-10-CM | POA: Diagnosis not present

## 2021-01-31 DIAGNOSIS — K5900 Constipation, unspecified: Secondary | ICD-10-CM

## 2021-01-31 DIAGNOSIS — J309 Allergic rhinitis, unspecified: Secondary | ICD-10-CM | POA: Diagnosis present

## 2021-01-31 DIAGNOSIS — E538 Deficiency of other specified B group vitamins: Secondary | ICD-10-CM | POA: Diagnosis present

## 2021-01-31 DIAGNOSIS — Z8249 Family history of ischemic heart disease and other diseases of the circulatory system: Secondary | ICD-10-CM

## 2021-01-31 DIAGNOSIS — I110 Hypertensive heart disease with heart failure: Secondary | ICD-10-CM | POA: Diagnosis present

## 2021-01-31 DIAGNOSIS — Z7902 Long term (current) use of antithrombotics/antiplatelets: Secondary | ICD-10-CM

## 2021-01-31 DIAGNOSIS — E119 Type 2 diabetes mellitus without complications: Secondary | ICD-10-CM

## 2021-01-31 DIAGNOSIS — G8929 Other chronic pain: Secondary | ICD-10-CM | POA: Diagnosis not present

## 2021-01-31 DIAGNOSIS — I252 Old myocardial infarction: Secondary | ICD-10-CM

## 2021-01-31 DIAGNOSIS — Z833 Family history of diabetes mellitus: Secondary | ICD-10-CM

## 2021-01-31 DIAGNOSIS — R109 Unspecified abdominal pain: Secondary | ICD-10-CM | POA: Diagnosis not present

## 2021-01-31 DIAGNOSIS — Z85828 Personal history of other malignant neoplasm of skin: Secondary | ICD-10-CM

## 2021-01-31 DIAGNOSIS — Z20822 Contact with and (suspected) exposure to covid-19: Secondary | ICD-10-CM | POA: Diagnosis present

## 2021-01-31 DIAGNOSIS — Z7984 Long term (current) use of oral hypoglycemic drugs: Secondary | ICD-10-CM

## 2021-01-31 DIAGNOSIS — S81001D Unspecified open wound, right knee, subsequent encounter: Secondary | ICD-10-CM | POA: Diagnosis not present

## 2021-01-31 DIAGNOSIS — E118 Type 2 diabetes mellitus with unspecified complications: Secondary | ICD-10-CM

## 2021-01-31 DIAGNOSIS — Z79899 Other long term (current) drug therapy: Secondary | ICD-10-CM

## 2021-01-31 LAB — CBC
HCT: 35.9 % — ABNORMAL LOW (ref 39.0–52.0)
Hemoglobin: 11.2 g/dL — ABNORMAL LOW (ref 13.0–17.0)
MCH: 28.9 pg (ref 26.0–34.0)
MCHC: 31.2 g/dL (ref 30.0–36.0)
MCV: 92.5 fL (ref 80.0–100.0)
Platelets: 187 10*3/uL (ref 150–400)
RBC: 3.88 MIL/uL — ABNORMAL LOW (ref 4.22–5.81)
RDW: 15 % (ref 11.5–15.5)
WBC: 11.6 10*3/uL — ABNORMAL HIGH (ref 4.0–10.5)
nRBC: 0 % (ref 0.0–0.2)

## 2021-01-31 LAB — LIPASE, BLOOD: Lipase: 28 U/L (ref 11–51)

## 2021-01-31 LAB — COMPREHENSIVE METABOLIC PANEL
ALT: 13 U/L (ref 0–44)
AST: 17 U/L (ref 15–41)
Albumin: 3.5 g/dL (ref 3.5–5.0)
Alkaline Phosphatase: 79 U/L (ref 38–126)
Anion gap: 15 (ref 5–15)
BUN: 22 mg/dL (ref 8–23)
CO2: 25 mmol/L (ref 22–32)
Calcium: 9.3 mg/dL (ref 8.9–10.3)
Chloride: 95 mmol/L — ABNORMAL LOW (ref 98–111)
Creatinine, Ser: 1.23 mg/dL (ref 0.61–1.24)
GFR, Estimated: 59 mL/min — ABNORMAL LOW (ref >60)
Glucose, Bld: 398 mg/dL — ABNORMAL HIGH (ref 70–99)
Potassium: 4.5 mmol/L (ref 3.5–5.1)
Sodium: 135 mmol/L (ref 135–145)
Total Bilirubin: 1.2 mg/dL (ref 0.3–1.2)
Total Protein: 6.5 g/dL (ref 6.5–8.1)

## 2021-01-31 MED ORDER — IOHEXOL 300 MG/ML  SOLN
80.0000 mL | Freq: Once | INTRAMUSCULAR | Status: AC | PRN
  Administered 2021-01-31: 80 mL via INTRAVENOUS

## 2021-01-31 MED ORDER — FENTANYL CITRATE (PF) 100 MCG/2ML IJ SOLN: 75.0000 ug | Freq: Once | INTRAMUSCULAR | Status: AC

## 2021-01-31 MED ORDER — FENTANYL CITRATE (PF) 100 MCG/2ML IJ SOLN
INTRAMUSCULAR | Status: AC
  Administered 2021-01-31: 75 ug via INTRAVENOUS
  Filled 2021-01-31: qty 2

## 2021-01-31 MED ORDER — LIDOCAINE HCL URETHRAL/MUCOSAL 2 % EX GEL
1.0000 "application " | Freq: Once | CUTANEOUS | Status: AC
  Administered 2021-01-31: 1 via TOPICAL
  Filled 2021-01-31: qty 11

## 2021-01-31 MED ORDER — MINERAL OIL RE ENEM
1.0000 | ENEMA | Freq: Once | RECTAL | Status: AC
  Administered 2021-01-31: 1 via RECTAL
  Filled 2021-01-31: qty 1

## 2021-01-31 NOTE — ED Triage Notes (Signed)
Pt to triage via [facility] from home.  Reports constipation and lower abd pain x 3 days.  Drinking prune juice, Miralax, and used an enema without relief.  Seen at [facility] yesterday for same.

## 2021-01-31 NOTE — ED Triage Notes (Signed)
Emergency Medicine Provider Triage Evaluation Note  TESHAUN OLARTE , a 80 y.o. male  was evaluated in triage.  Pt complains of worsening abd pain and constipation  Review of Systems  Positive: abd pain, constipation Negative: fever  Physical Exam  BP 124/72 (BP Location: Left Arm)   Pulse 100   Temp 98.6 F (37 C) (Oral)   Resp 20   SpO2 100%  Gen:   Awake, no distress  HEENT:  Atraumatic  Resp:  Normal effort  Cardiac:  Normal rate Abd:   Soft, mildly distended. Tender mid to lower abd, no peritonitis.    Medical Decision Making  Medically screening exam initiated at 6:51 PM.  Appropriate orders placed.  SHRIHAN PUTT was informed that the remainder of the evaluation will be completed by another provider, this initial triage assessment does not replace that evaluation, and the importance of remaining in the ED until their evaluation is complete.  Clinical Impression  Yesterday xrays reviewed -  No obvious sbo.   Imaging and labs ordered and will place in treatment room as soon as available.    Lajean Saver, MD 01/31/21 [phone]

## 2021-01-31 NOTE — ED Notes (Signed)
Pt assisted to the bathroom via wheelchair. The patient urinated and had a bowel movement on himself. Pt cleaned and placed in paper scrubs. Soiled pants bagged and with patients, underwear thrown out per pr's request.

## 2021-01-31 NOTE — ED Provider Notes (Signed)
[facility] EMERGENCY DEPARTMENT Provider Note   CSN: [id number] Arrival date & time: 01/31/21  1821     History Chief Complaint  Patient presents with  . Constipation    Keith Espinoza is a 80 y.o. male w/ hx of diabetes, left BKA, presenting to ED with constipation. Patient seen in ED yesterday for constipation x 4 days, rectal fullness, intermittent abdominal pains.  He had an x-ray which showed stool in the descending and transverse colon.  He was discharged on MiraLAX, stool softener, and enemas.  He and his daughter report they have tried all of this.  They are not able to produce a bowel movement.  A home nurse came by today and attempted digital disimpaction, but could not tolerate it due to pain.  He also tried another enema with the nurse today.  He denies nausea or vomiting.  He reports he had been on oxycodone until about 4 to 5 days ago for chronic pain in his lower extremities.  He been taking 5 mg every 6 hours.  He does have a history of constipation typically will go 3 days on average between bowel movements.  However he has never had a fecal impaction before  HPI     Past Medical History:  Diagnosis Date  . Allergic rhinitis   . Allergic rhinitis   . Arthritis   . Basal cell carcinoma 11/01/2019    bcc left chest treatment TX cx3 21fu   . Chronic leg pain    right  . Chronic lower back pain   . Coronary artery disease    a. Stenting to RCA 2004; staged DES to LAD and Cx 2004. DES to mRCA 2012. b. DES to mCx, PTCA to dCx 11/2011. c. Lateral wall MI 2013 s/p PTCA to distal Cx & DES to mid OM2 11/2011. d. Low risk nuc 04/2014, EF wnl.  Marland Kitchen COVID-19   . Diabetes mellitus    Insulin dependent  . Diabetic neuropathy (HCC)    MILD  . Diverticulosis   . Dysrhythmia   . Gilbert syndrome   . Gout    right wrist; right foot; right elbow; have had it since 1970's  . H/O hiatal hernia   . Heart murmur   . History of echocardiogram    aortic sclerosis  per echo 12/09 EF 65%, otherwise normal  . History of hemorrhoids    BLEEDING  . History of kidney stones    h/o  . Hypertension    Diagnosed 1995   . Myocardial infarction ([place])   . Pancreatic pseudocyst    a. s/p remote drainage 2006.  Marland Kitchen Thrombocytopenia ([place])    Seen on oldest labs in system from 2004  . Vitamin B 12 deficiency    orally replaced    Patient Active Problem List   Diagnosis Date Noted  . Wound dehiscence 01/09/2021  . Dehiscence of amputation stump ([place])   . Status post percutaneous transluminal coronary angioplasty 01/06/2021  . Type II diabetes mellitus, uncontrolled ([place]) 01/06/2021  . Acute pancreatitis 01/06/2021  . Diabetic peripheral vascular disease ([place]) 01/06/2021  . Encounter for screening for other disorder 01/06/2021  . Enlarged prostate 01/06/2021  . Foot ulcer, right ([place]) 01/06/2021  . Gout 01/06/2021  . Headache 01/06/2021  . Neck pain 01/06/2021  . Hypoglycemia 01/06/2021  . Loss of appetite 01/06/2021  . 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 Peripheral neuropathy 01/06/2021  . Sciatica 01/06/2021  . Vitamin B12 deficiency 01/06/2021  .  Vitamin D deficiency 01/06/2021  . Weakness 01/06/2021  . Diabetes mellitus type 2 with neurological manifestations ([place]) 01/06/2021  . Protein-calorie malnutrition, severe 12/26/2020  . Acute blood loss anemia 12/26/2020  . Prerenal azotemia 12/26/2020  . Below-knee amputation of right lower extremity ([place]) 12/12/2020  . Gangrene of right foot ([place])   . Diabetic neuropathy ([place]) 11/17/2020  . Hyperglycemia due to type 2 diabetes mellitus ([place]) 11/17/2020  . Long term (current) use of insulin ([place]) 11/17/2020  . Obesity 11/17/2020  . 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/2019  . Hip dislocation, right ([place]) 04/13/2019  . Other intervertebral disc degeneration, lumbar region 03/30/2019  . CAD (coronary artery disease) 01/30/2019  . Tobacco abuse 01/30/2019  . Recurrent dislocation of right hip  04/25/2018  . Burn, foot, second degree, left, initial encounter 06/08/2017  . 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 S/P total knee arthroplasty, left 10/26/2016  . Hyperlipidemia 09/04/2014  . Thrombocytopenia ([place])   . Precordial chest pain 04/05/2014  . Coronary atherosclerosis of native coronary artery 10/01/2013  . Other and unspecified hyperlipidemia 10/01/2013  . Essential hypertension, benign 10/01/2013  . Insulin dependent type 2 diabetes mellitus ([place]) 10/01/2013  . Esophageal reflux 10/01/2013  . Hypertrophy of prostate without urinary obstruction and other lower urinary tract symptoms (LUTS) 10/01/2013    Past Surgical History:  Procedure Laterality Date  . ABDOMINAL AORTOGRAM W/LOWER EXTREMITY Bilateral 08/08/2020   Procedure: ABDOMINAL AORTOGRAM W/LOWER EXTREMITY;  Surgeon: Angelia Mould, MD;  Location: [facility];  Service: Cardiovascular;  Laterality: Bilateral;  . AMPUTATION Right 12/12/2020   Procedure: RIGHT BELOW KNEE AMPUTATION;  Surgeon: [name], MD;  Location: [facility];  Service: Orthopedics;  Laterality: Right;  . BACK SURGERY     "total of 3 times" S/P fall   . CARPAL TUNNEL RELEASE Bilateral   . CHOLECYSTECTOMY  1990's  . COLONOSCOPY    . CORONARY ANGIOPLASTY  11/11/11  . CORONARY ANGIOPLASTY WITH STENT PLACEMENT  09/30/2011   "1 then; makes a total of 4"  . CORONARY ANGIOPLASTY WITH STENT PLACEMENT  11/11/11   "1; makes a total of 5"  . INGUINAL HERNIA REPAIR  2003   right  . JOINT REPLACEMENT Right 04/03/2002   hip replacment  . KNEE ARTHROSCOPY  1990's   left  . LEFT HEART CATHETERIZATION WITH CORONARY ANGIOGRAM N/A 09/30/2011   Procedure: LEFT HEART CATHETERIZATION WITH CORONARY ANGIOGRAM;  Surgeon: Jettie Booze, MD;  Location: [facility] CATH LAB;  Service: Cardiovascular;  Laterality: N/A;  possible PCI  . LEFT HEART CATHETERIZATION WITH CORONARY ANGIOGRAM N/A 11/15/2011   Procedure: LEFT HEART  CATHETERIZATION WITH CORONARY ANGIOGRAM;  Surgeon: Jettie Booze, MD;  Location: [facility] CATH LAB;  Service: Cardiovascular;  Laterality: N/A;  . PERCUTANEOUS CORONARY STENT INTERVENTION (PCI-S)  09/30/2011   Procedure: PERCUTANEOUS CORONARY STENT INTERVENTION (PCI-S);  Surgeon: Jettie Booze, MD;  Location: [facility] CATH LAB;  Service: Cardiovascular;;  . PERCUTANEOUS CORONARY STENT INTERVENTION (PCI-S) N/A 11/11/2011   Procedure: PERCUTANEOUS CORONARY STENT INTERVENTION (PCI-S);  Surgeon: Jettie Booze, MD;  Location: [facility] CATH LAB;  Service: Cardiovascular;  Laterality: N/A;  . PERIPHERAL VASCULAR BALLOON ANGIOPLASTY Right 08/08/2020   Procedure: PERIPHERAL VASCULAR BALLOON ANGIOPLASTY;  Surgeon: Angelia Mould, MD;  Location: [facility];  Service: Cardiovascular;  Laterality: Right;  Posterior tibial   . SHOULDER SURGERY Right    X 2  . STUMP REVISION Right 01/09/2021   Procedure: REVISION RIGHT BELOW KNEE  AMPUTATION;  Surgeon: Nadara Mustarduda, Marcus V, MD;  [facility];  Service: Orthopedics;  Laterality: Right;  . TONSILLECTOMY  ~ 1948  . TOTAL HIP REVISION Right 04/13/2019   Procedure: RIGHT TOTAL HIP REVISION-POSTERIOR  APPROACH LATERAL;  Surgeon: Eldred MangesYates, Mark C, MD;  Location: [facility] OR;  Service: Orthopedics;  Laterality: Right;  . TOTAL KNEE ARTHROPLASTY Left 07/23/2016   Procedure: LEFT TOTAL KNEE ARTHROPLASTY;  Surgeon: Eldred MangesMark C Yates, MD;  Location: [facility] OR;  Service: Orthopedics;  Laterality: Left;       Family History  Problem Relation Age of Onset  . Diabetes Mother   . Hyperlipidemia Mother   . Hypertension Mother   . Cancer Father   . Hypertension Father   . Diabetes Sister   . Hypertension Sister   . Cancer Brother   . Heart attack Neg Hx     Social History   Tobacco Use  . Smoking status: Former Games developermoker  . Smokeless tobacco: Current User    Types: Chew  . Tobacco comment: quit 60 years ago  Vaping Use  . Vaping Use: Never used  Substance Use Topics  .  Alcohol use: No  . Drug use: No    Home Medications Prior to Admission medications   Medication Sig Start Date End Date Taking? Authorizing Provider  acetaminophen (TYLENOL) 500 MG tablet Take 1,000 mg by mouth every 6 (six) hours as needed (pain).   Yes [provider]  allopurinol (ZYLOPRIM) 300 MG tablet Take 300 mg by mouth daily.   Yes [provider]  amLODipine (NORVASC) 5 MG tablet Take 1 tablet (5 mg total) by mouth daily. 12/26/20  Yes Love, Evlyn Kanneramela S, PA-C  aspirin EC 81 MG tablet Take 81 mg by mouth daily.   Yes [provider]  Cholecalciferol (VITAMIN D3) 50 MCG (2000 UT) TABS Take 2,000 Units by mouth daily.    Yes [provider]  clopidogrel (PLAVIX) 75 MG tablet Take 1 tablet (75 mg total) by mouth daily. 12/26/20  Yes Love, Evlyn KannerPamela S, PA-C  Cyanocobalamin (B-12) 2500 MCG TABS Take 2,500 mcg by mouth daily.   Yes [provider]  empagliflozin (JARDIANCE) 25 MG TABS tablet Take 25 mg by mouth daily.   Yes [provider]  gabapentin (NEURONTIN) 100 MG capsule Take 2 capsules (200 mg total) by mouth at bedtime. Patient taking differently: Take 600 mg by mouth at bedtime. 12/26/20  Yes Love, Evlyn KannerPamela S, PA-C  gabapentin (NEURONTIN) 400 MG capsule Take 1 capsule (400 mg total) by mouth 3 (three) times daily. Patient taking differently: Take 400 mg by mouth daily. 12/26/20  Yes Love, Evlyn KannerPamela S, PA-C  hydrochlorothiazide (HYDRODIURIL) 12.5 MG tablet Take 1 tablet (12.5 mg total) by mouth daily. 12/26/20  Yes Love, Evlyn KannerPamela S, PA-C  insulin detemir (LEVEMIR FLEXTOUCH) 100 UNIT/ML FlexPen Inject 15-20 Units into the skin at bedtime. Dose is based on CBG   Yes [provider]  isosorbide mononitrate (IMDUR) 30 MG 24 hr tablet Take 1 tablet (30 mg total) by mouth daily. Please schedule yearly appointment for future refills. Thank you 12/26/20  Yes Love, Evlyn Kanneramela S, PA-C  losartan (COZAAR) 50 MG tablet Take 1 tablet (50 mg total) by mouth  daily. 12/26/20  Yes Love, Evlyn KannerPamela S, PA-C  metFORMIN (GLUCOPHAGE) 1000 MG tablet Take 1 tablet (1,000 mg total) by mouth 2 (two) times daily. 11/13/11  Yes Corky CraftsVaranasi, Jayadeep S, MD  methocarbamol (ROBAXIN) 500 MG tablet Take 1 tablet (500 mg total) by mouth  every 6 (six) hours as needed for muscle spasms. 12/26/20  Yes Love, Evlyn Kanner, PA-C  nitroGLYCERIN (NITROSTAT) 0.4 MG SL tablet Place 0.4 mg under the tongue every 5 (five) minutes x 3 doses as needed for chest pain.    Yes [provider]  ONE TOUCH ULTRA TEST test strip CHECK BLOOD SUGAR ONCE DAILY AS DIRECTED 09/23/17  Yes [provider]  oxyCODONE (OXY IR/ROXICODONE) 5 MG immediate release tablet Take 1 tablet (5 mg total) by mouth every 4 (four) hours as needed for severe pain. 01/12/21  Yes Nadara Mustard, MD  polyethylene glycol (MIRALAX) 17 g packet Take 17 g by mouth daily. 01/30/21  Yes Long, Arlyss Repress, MD  senna-docusate (SENOKOT-S) 8.6-50 MG tablet Take 1 tablet by mouth at bedtime as needed for mild constipation or moderate constipation. 01/30/21  Yes Long, Arlyss Repress, MD  terazosin (HYTRIN) 5 MG capsule Take 1 capsule (5 mg total) by mouth at bedtime. 12/26/20  Yes Love, Evlyn Kanner, PA-C  traMADol (ULTRAM) 50 MG tablet Take 1 tablet (50 mg total) by mouth every 6 (six) hours as needed for moderate pain. 12/26/20  Yes Love, Evlyn Kanner, PA-C  traZODone (DESYREL) 50 MG tablet Take 0.5-1 tablets (25-50 mg total) by mouth at bedtime as needed for sleep. Patient taking differently: Take 50 mg by mouth at bedtime as needed for sleep. 12/26/20  Yes Love, Evlyn Kanner, PA-C  doxycycline (VIBRA-TABS) 100 MG tablet Take 1 tablet (100 mg total) by mouth every 12 (twelve) hours. Patient not taking: Reported on 01/31/2021 12/30/20   Nadara Mustard, MD    Allergies    Simvastatin, Zetia [ezetimibe], and Dilaudid [hydromorphone hcl]  Review of Systems   Review of Systems  Constitutional: Negative for chills and fever.  HENT: Negative for ear pain.    Eyes: Negative for pain and visual disturbance.  Respiratory: Negative for cough and shortness of breath.   Cardiovascular: Negative for chest pain and palpitations.  Gastrointestinal: Positive for abdominal pain, constipation and rectal pain. Negative for vomiting.  Musculoskeletal: Negative for arthralgias and back pain.  Skin: Negative for color change and rash.  Neurological: Negative for syncope and light-headedness.  All other systems reviewed and are negative.   Physical Exam Updated Vital Signs BP 140/71   Pulse 95   Temp 98.6 F (37 C) (Oral)   Resp (!) 21   SpO2 98%   Physical Exam Constitutional:      General: He is not in acute distress. HENT:     Head: Normocephalic and atraumatic.  Eyes:     Conjunctiva/sclera: Conjunctivae normal.     Pupils: Pupils are equal, round, and reactive to light.  Cardiovascular:     Rate and Rhythm: Normal rate and regular rhythm.  Pulmonary:     Effort: Pulmonary effort is normal. No respiratory distress.  Abdominal:     General: There is no distension.     Tenderness: There is no abdominal tenderness.  Genitourinary:    Comments: Rectal exam with fecalith palpable  Musculoskeletal:     Comments: Left BKA  Skin:    General: Skin is warm and dry.  Neurological:     General: No focal deficit present.     Mental Status: He is alert. Mental status is at baseline.  Psychiatric:        Mood and Affect: Mood normal.        Behavior: Behavior normal.     ED Results / Procedures / Treatments  Labs (all labs ordered are listed, but only abnormal results are displayed) Labs Reviewed  COMPREHENSIVE METABOLIC PANEL - Abnormal; Notable for the following components:      Result Value   Chloride 95 (*)    Glucose, Bld 398 (*)    GFR, Estimated 59 (*)    All other components within normal limits  CBC - Abnormal; Notable for the following components:   WBC 11.6 (*)    RBC 3.88 (*)    Hemoglobin 11.2 (*)    HCT 35.9 (*)     All other components within normal limits  LIPASE, BLOOD    EKG None  Radiology CT Abdomen Pelvis W Contrast  Result Date: 01/31/2021 CLINICAL DATA:  Constipation for 3 days, abdominal pain EXAM: CT ABDOMEN AND PELVIS WITH CONTRAST TECHNIQUE: Multidetector CT imaging of the abdomen and pelvis was performed using the standard protocol following bolus administration of intravenous contrast. CONTRAST:  81mL OMNIPAQUE IOHEXOL 300 MG/ML  SOLN COMPARISON:  01/30/2021 FINDINGS: Lower chest: No acute pleural or parenchymal lung disease. Hepatobiliary: No focal liver abnormality is seen. Status post cholecystectomy. No biliary dilatation. Pancreas: Unremarkable. No pancreatic ductal dilatation or surrounding inflammatory changes. Spleen: Normal in size without focal abnormality. Adrenals/Urinary Tract: 10 mm indeterminate left adrenal nodule may reflect adenoma. Right adrenals unremarkable. There are punctate bilateral nonobstructing renal calculi, measuring up to 5 mm on the right and 3 mm on the left. No obstructive uropathy within either kidney. There is bilateral renal cortical thinning and scarring. The bladder is unremarkable. Stomach/Bowel: No bowel obstruction or ileus. There is a large amount of fecal retention throughout the colon. Significant stool within the rectal vault consistent with fecal impaction. No bowel wall thickening or inflammatory change. Vascular/Lymphatic: Aortic atherosclerosis. No enlarged abdominal or pelvic lymph nodes. Reproductive: Prostate is unremarkable. Other: There is no free fluid or free gas. No abdominal wall hernia. Musculoskeletal: Stable right hip arthroplasty. No acute or destructive bony lesions. Reconstructed images demonstrate no additional findings. IMPRESSION: 1. Large amount of retained stool throughout the colon consistent with constipation. Large amount of stool within the rectal vault consistent with fecal impaction. 2. Punctate bilateral nonobstructing renal  calculi. 3. Indeterminate 10 mm left adrenal nodule, likely adenoma. 4.  Aortic Atherosclerosis (ICD10-I70.0). Electronically Signed   By: Randa Ngo M.D.   On: 01/31/2021 22:51   DG Abdomen Acute W/Chest  Result Date: 01/30/2021 CLINICAL DATA:  Constipation EXAM: DG ABDOMEN ACUTE WITH 1 VIEW CHEST COMPARISON:  None. FINDINGS: Moderate amount of stool in the transverse and descending colon. No bowel dilatation to suggest obstruction. Large calcification projecting over the left kidney likely reflecting a staghorn calculus. Heart size and mediastinal contours are within normal limits. Both lungs are clear. Right hip arthroplasty. IMPRESSION: 1. Moderate amount of stool in the transverse and descending colon. 2. Large calcification projecting over the left kidney likely reflecting a staghorn calculus. Electronically Signed   By: Kathreen Devoid   On: 01/30/2021 20:24    Procedures Fecal disimpaction  Date/Time: 02/01/2021 12:23 AM Performed by: Wyvonnia Dusky, MD Authorized by: Wyvonnia Dusky, MD  Consent: Verbal consent obtained. Risks and benefits: risks, benefits and alternatives were discussed Consent given by: patient Patient understanding: patient states understanding of the procedure being performed Patient consent: the patient's understanding of the procedure matches consent given Relevant documents: relevant documents present and verified Imaging studies: imaging studies available Patient identity confirmed: verbally with patient Time out: Immediately prior to procedure a "time out" was called  to verify the correct patient, procedure, equipment, support staff and site/side marked as required. Preparation: Patient was prepped and draped in the usual sterile fashion. Local anesthesia used: yes Anesthesia: local infiltration  Anesthesia: Local anesthesia used: yes Local anesthetic: urojet jelly. Patient tolerance: patient tolerated the procedure well with no immediate  complications Comments: 75 mcg fentanyl given for pain      Medications Ordered in ED Medications  lidocaine (XYLOCAINE) 2 % jelly 1 application (1 application Topical Given 01/31/21 2223)  iohexol (OMNIPAQUE) 300 MG/ML solution 80 mL (80 mLs Intravenous Contrast Given 01/31/21 2130)  fentaNYL (SUBLIMAZE) injection 75 mcg (75 mcg Intravenous Given 01/31/21 2220)  mineral oil enema 1 enema (1 enema Rectal Given 01/31/21 2310)    ED Course  I have reviewed the triage vital signs and the nursing notes.  Pertinent labs & imaging results that were available during my care of the patient were reviewed by me and considered in my medical decision making (see chart for details).   80 year old male here with persistent constipation for 5 days now.  Rectal pain and fullness.  CT scan today shows large fecalith in the colon and rectal vault.  I did perform a digital disimpaction using lidocaine and fentanyl for pain.  I was able to break up some of the fecalith, but could not reach the entire stool ball with my finger.  We then administered an enema.  If he is unable to have a bowel movement, will plan for medical admission for continued GI treatment -given his age and frailty, I am concerned about developing stercolitis or bowel perforation if this impaction worsens at home.  He was not successful at home today with 2 enemas, mag citrate, and miralax.  I reviewed his labs personally which are close to his baseline levels.  No significant leukocytosis or evidence of perforation on CT of the abdomen.  Additional history provided by his daughter at bedside.  Prior ED medical record reviewed from yesterday.   Clinical Course as of 02/01/21 Arther Dames Feb 01, 2021  0019 Signed out to Dr Stark Jock pending reassessment for successful bowel movement - if unable would admit to hospital for fecal impaction. [MT]    Clinical Course User Index [MT] Brent Noto, Carola Rhine, MD    Final Clinical Impression(s) / ED  Diagnoses Final diagnoses:  Fecal impaction ([place])  Constipation, unspecified constipation type    Rx / DC Orders ED Discharge Orders    None       Chey Cho, Carola Rhine, MD 02/01/21 [id number]

## 2021-02-01 DIAGNOSIS — E1165 Type 2 diabetes mellitus with hyperglycemia: Secondary | ICD-10-CM | POA: Diagnosis not present

## 2021-02-01 DIAGNOSIS — E1151 Type 2 diabetes mellitus with diabetic peripheral angiopathy without gangrene: Secondary | ICD-10-CM | POA: Diagnosis not present

## 2021-02-01 DIAGNOSIS — N400 Enlarged prostate without lower urinary tract symptoms: Secondary | ICD-10-CM | POA: Diagnosis not present

## 2021-02-01 DIAGNOSIS — I110 Hypertensive heart disease with heart failure: Secondary | ICD-10-CM | POA: Diagnosis not present

## 2021-02-01 DIAGNOSIS — K5641 Fecal impaction: Secondary | ICD-10-CM | POA: Diagnosis not present

## 2021-02-01 DIAGNOSIS — I2510 Atherosclerotic heart disease of native coronary artery without angina pectoris: Secondary | ICD-10-CM | POA: Diagnosis not present

## 2021-02-01 DIAGNOSIS — E876 Hypokalemia: Secondary | ICD-10-CM | POA: Diagnosis not present

## 2021-02-01 DIAGNOSIS — I5032 Chronic diastolic (congestive) heart failure: Secondary | ICD-10-CM | POA: Diagnosis not present

## 2021-02-01 DIAGNOSIS — E1142 Type 2 diabetes mellitus with diabetic polyneuropathy: Secondary | ICD-10-CM | POA: Diagnosis not present

## 2021-02-01 DIAGNOSIS — Z20822 Contact with and (suspected) exposure to covid-19: Secondary | ICD-10-CM | POA: Diagnosis not present

## 2021-02-01 DIAGNOSIS — K219 Gastro-esophageal reflux disease without esophagitis: Secondary | ICD-10-CM | POA: Diagnosis not present

## 2021-02-01 DIAGNOSIS — I252 Old myocardial infarction: Secondary | ICD-10-CM | POA: Diagnosis not present

## 2021-02-01 HISTORY — DX: Fecal impaction: K56.41

## 2021-02-01 LAB — GLUCOSE, CAPILLARY
Glucose-Capillary: 263 mg/dL — ABNORMAL HIGH (ref 70–99)
Glucose-Capillary: 293 mg/dL — ABNORMAL HIGH (ref 70–99)
Glucose-Capillary: 214 mg/dL — ABNORMAL HIGH (ref 70–99)
Glucose-Capillary: 273 mg/dL — ABNORMAL HIGH (ref 70–99)
Glucose-Capillary: 206 mg/dL — ABNORMAL HIGH (ref 70–99)

## 2021-02-01 LAB — CBC
HCT: 32.1 % — ABNORMAL LOW (ref 39.0–52.0)
Hemoglobin: 10.6 g/dL — ABNORMAL LOW (ref 13.0–17.0)
MCH: 29.5 pg (ref 26.0–34.0)
MCHC: 33 g/dL (ref 30.0–36.0)
MCV: 89.4 fL (ref 80.0–100.0)
Platelets: 171 10*3/uL (ref 150–400)
RBC: 3.59 MIL/uL — ABNORMAL LOW (ref 4.22–5.81)
RDW: 14.8 % (ref 11.5–15.5)
WBC: 9.3 10*3/uL (ref 4.0–10.5)
nRBC: 0 % (ref 0.0–0.2)

## 2021-02-01 LAB — BASIC METABOLIC PANEL
Anion gap: 11 (ref 5–15)
BUN: 23 mg/dL (ref 8–23)
CO2: 32 mmol/L (ref 22–32)
Calcium: 9.1 mg/dL (ref 8.9–10.3)
Chloride: 95 mmol/L — ABNORMAL LOW (ref 98–111)
Creatinine, Ser: 1.11 mg/dL (ref 0.61–1.24)
GFR, Estimated: >60 mL/min (ref >60)
Glucose, Bld: 231 mg/dL — ABNORMAL HIGH (ref 70–99)
Potassium: 3.9 mmol/L (ref 3.5–5.1)
Sodium: 138 mmol/L (ref 135–145)

## 2021-02-01 LAB — PHOSPHORUS: Phosphorus: 3.8 mg/dL (ref 2.5–4.6)

## 2021-02-01 LAB — RESP PANEL BY RT-PCR (FLU A&B, COVID) ARPGX2
Influenza A by PCR: NEGATIVE
Influenza B by PCR: NEGATIVE
SARS Coronavirus 2 by RT PCR: NEGATIVE

## 2021-02-01 LAB — MAGNESIUM: Magnesium: 2.7 mg/dL — ABNORMAL HIGH (ref 1.7–2.4)

## 2021-02-01 MED ORDER — ALLOPURINOL 300 MG PO TABS
300.0000 mg | ORAL_TABLET | Freq: Every day | ORAL | Status: DC
  Administered 2021-02-01 – 2021-02-03 (×3): 300 mg via ORAL
  Filled 2021-02-01 (×3): qty 1

## 2021-02-01 MED ORDER — BISACODYL 10 MG RE SUPP
10.0000 mg | Freq: Every day | RECTAL | Status: DC | PRN
  Administered 2021-02-01: 10 mg via RECTAL
  Filled 2021-02-01 (×2): qty 1

## 2021-02-01 MED ORDER — LACTULOSE ENEMA
300.0000 mL | Freq: Once | ORAL | Status: AC | PRN
  Administered 2021-02-02: 300 mL via RECTAL
  Filled 2021-02-01 (×3): qty 300

## 2021-02-01 MED ORDER — MAGNESIUM CITRATE PO SOLN
1.0000 | Freq: Once | ORAL | Status: AC
  Administered 2021-02-01: 1 via ORAL
  Filled 2021-02-01: qty 296

## 2021-02-01 MED ORDER — POLYETHYLENE GLYCOL 3350 17 G PO PACK
17.0000 g | PACK | Freq: Every day | ORAL | Status: DC
  Administered 2021-02-01 – 2021-02-02 (×2): 17 g via ORAL
  Filled 2021-02-01 (×2): qty 1

## 2021-02-01 MED ORDER — INSULIN ASPART 100 UNIT/ML IJ SOLN
0.0000 [IU] | Freq: Three times a day (TID) | INTRAMUSCULAR | Status: DC
  Administered 2021-02-01 (×2): 5 [IU] via SUBCUTANEOUS
  Administered 2021-02-01 – 2021-02-02 (×4): 3 [IU] via SUBCUTANEOUS
  Administered 2021-02-03: 2 [IU] via SUBCUTANEOUS

## 2021-02-01 MED ORDER — ASPIRIN EC 81 MG PO TBEC
81.0000 mg | DELAYED_RELEASE_TABLET | Freq: Every day | ORAL | Status: DC
  Administered 2021-02-01 – 2021-02-03 (×3): 81 mg via ORAL
  Filled 2021-02-01 (×3): qty 1

## 2021-02-01 MED ORDER — GABAPENTIN 100 MG PO CAPS
200.0000 mg | ORAL_CAPSULE | Freq: Every day | ORAL | Status: DC
  Administered 2021-02-01 – 2021-02-02 (×2): 200 mg via ORAL
  Filled 2021-02-01 (×2): qty 2

## 2021-02-01 MED ORDER — ISOSORBIDE MONONITRATE ER 30 MG PO TB24
30.0000 mg | ORAL_TABLET | Freq: Every day | ORAL | Status: DC
  Administered 2021-02-01 – 2021-02-03 (×3): 30 mg via ORAL
  Filled 2021-02-01 (×3): qty 1

## 2021-02-01 MED ORDER — SORBITOL 70 % SOLN: 960.0000 mL | TOPICAL_OIL | Freq: Once | ORAL | Status: DC

## 2021-02-01 MED ORDER — ACETAMINOPHEN 325 MG PO TABS: 650.0000 mg | ORAL_TABLET | Freq: Four times a day (QID) | ORAL | Status: DC | PRN

## 2021-02-01 MED ORDER — SENNOSIDES-DOCUSATE SODIUM 8.6-50 MG PO TABS
1.0000 | ORAL_TABLET | Freq: Two times a day (BID) | ORAL | Status: DC
  Administered 2021-02-01 – 2021-02-03 (×4): 1 via ORAL
  Filled 2021-02-01 (×5): qty 1

## 2021-02-01 MED ORDER — LACTATED RINGERS IV SOLN: INTRAVENOUS | Status: AC

## 2021-02-01 MED ORDER — PROCHLORPERAZINE EDISYLATE 10 MG/2ML IJ SOLN: 10.0000 mg | Freq: Four times a day (QID) | INTRAMUSCULAR | Status: DC | PRN

## 2021-02-01 MED ORDER — TERAZOSIN HCL 5 MG PO CAPS
5.0000 mg | ORAL_CAPSULE | Freq: Every day | ORAL | Status: DC
  Administered 2021-02-01 – 2021-02-02 (×2): 5 mg via ORAL
  Filled 2021-02-01 (×3): qty 1

## 2021-02-01 MED ORDER — SENNOSIDES-DOCUSATE SODIUM 8.6-50 MG PO TABS: 1.0000 | ORAL_TABLET | Freq: Every evening | ORAL | Status: DC | PRN

## 2021-02-01 MED ORDER — INSULIN ASPART 100 UNIT/ML IJ SOLN
0.0000 [IU] | Freq: Every day | INTRAMUSCULAR | Status: DC
  Administered 2021-02-01: 3 [IU] via SUBCUTANEOUS
  Administered 2021-02-02: 2 [IU] via SUBCUTANEOUS

## 2021-02-01 MED ORDER — CLOPIDOGREL BISULFATE 75 MG PO TABS
75.0000 mg | ORAL_TABLET | Freq: Every day | ORAL | Status: DC
  Administered 2021-02-01 – 2021-02-03 (×3): 75 mg via ORAL
  Filled 2021-02-01 (×3): qty 1

## 2021-02-01 MED ORDER — MELATONIN 3 MG PO TABS
3.0000 mg | ORAL_TABLET | Freq: Every evening | ORAL | Status: DC | PRN
  Administered 2021-02-01: 3 mg via ORAL
  Filled 2021-02-01 (×2): qty 1

## 2021-02-01 MED ORDER — ENOXAPARIN SODIUM 30 MG/0.3ML IJ SOSY
30.0000 mg | PREFILLED_SYRINGE | INTRAMUSCULAR | Status: DC
  Administered 2021-02-01 – 2021-02-02 (×2): 30 mg via SUBCUTANEOUS
  Filled 2021-02-01 (×2): qty 0.3

## 2021-02-01 MED ORDER — AMLODIPINE BESYLATE 5 MG PO TABS
5.0000 mg | ORAL_TABLET | Freq: Every day | ORAL | Status: DC
  Administered 2021-02-01 – 2021-02-03 (×3): 5 mg via ORAL
  Filled 2021-02-01 (×3): qty 1

## 2021-02-01 NOTE — H&P (Addendum)
History and Physical  Keith Espinoza [id number] DOB: 04-06-1941 DOA: 01/31/2021  Referring physician: Dr. Stark Jock, EDP PCP: [name], MD  Outpatient Specialists: Orthopedic surgery, vascular surgery. Patient coming from: Home.  Chief Complaint: Abdominal pain and constipation x 4 days.  HPI: Keith Espinoza is a 80 y.o. male with medical history significant for R BKA follows with orthopedic surgery, Dr. Sharol Given outpatient, essential hypertension, type 2 diabetes, diabetic polyneuropathy, gout, BPH, coronary artery disease, peripheral vascular disease, who presented to [facility] ED due to intermittent abdominal pain and constipation for 4 days duration.  His normal bowel habits include a bowel movement every 3 days.  Denies any recent use of opiates in the past 5 days.  Denies change in eating habits prior to this.  Denies living a sedentary lifestyle.  States he has remained active despite his right below the knee amputation.  Man[place] his [place].  Initially presented to the ED on 01/30/2021 due to the same.  Abdominal x-ray revealed stool burden without evidence of small bowel obstruction for which he was prescribed stool softener, laxative and sent home.  He returned to the ED the following day with the same complaint.  CT abdomen pelvis with contrast revealed large amount of retained stool throughout the colon consistent with constipation.  Large amount of stool within the rectal vault consistent with fecal impaction.  Received manual disimpaction in the ED with minimal results.  [facility] hospitalist team was asked to admit.  ED Course:  Afebrile.  BP 120/69, pulse 94, respiratory rate 16, oxygen saturation 97% on room air.  Lab studies remarkable for serum sodium 135, serum bicarb 25, serum glucose 398, creatinine 1.23 with GFR of 59.  Hemoglobin 11.2 with baseline of 12.  WBC 11.6.  Review of Systems: Review of systems as noted in the HPI. All other systems reviewed and are negative.   Past  Medical History:  Diagnosis Date  . Allergic rhinitis   . Allergic rhinitis   . Arthritis   . Basal cell carcinoma 11/01/2019    bcc left chest treatment [place] cx3 29fu   . Chronic leg pain    right  . Chronic lower back pain   . Coronary artery disease    a. Stenting to RCA 2004; staged DES to LAD and Cx 2004. DES to mRCA 2012. b. DES to mCx, PTCA to dCx 11/2011. c. Lateral wall MI 2013 s/p PTCA to distal Cx & DES to mid OM2 11/2011. d. Low risk nuc 04/2014, EF wnl.  Marland Kitchen COVID-19   . Diabetes mellitus    Insulin dependent  . Diabetic neuropathy (HCC)    MILD  . Diverticulosis   . Dysrhythmia   . Gilbert syndrome   . Gout    right wrist; right foot; right elbow; have had it since 1970's  . H/O hiatal hernia   . Heart murmur   . History of echocardiogram    aortic sclerosis per echo 12/09 EF 65%, otherwise normal  . History of hemorrhoids    BLEEDING  . History of kidney stones    h/o  . Hypertension    Diagnosed 1995   . Myocardial infarction ([place])   . Pancreatic pseudocyst    a. s/p remote drainage 2006.  Marland Kitchen Thrombocytopenia ([place])    Seen on oldest labs in system from 2004  . Vitamin B 12 deficiency    orally replaced   Past Surgical History:  Procedure Laterality Date  . ABDOMINAL AORTOGRAM W/LOWER EXTREMITY  Bilateral 08/08/2020   Procedure: ABDOMINAL AORTOGRAM W/LOWER EXTREMITY;  Surgeon: Angelia Mould, MD;  Location: [place] CV LAB;  Service: Cardiovascular;  Laterality: Bilateral;  . AMPUTATION Right 12/12/2020   Procedure: RIGHT BELOW KNEE AMPUTATION;  Surgeon: [name], MD;  Location: [place];  Service: Orthopedics;  Laterality: Right;  . BACK SURGERY     "total of 3 times" S/P fall   . CARPAL TUNNEL RELEASE Bilateral   . CHOLECYSTECTOMY  1990's  . COLONOSCOPY    . CORONARY ANGIOPLASTY  11/11/11  . CORONARY ANGIOPLASTY WITH STENT PLACEMENT  09/30/2011   "1 then; makes a total of 4"  . CORONARY ANGIOPLASTY WITH STENT PLACEMENT  11/11/11   "1; makes a  total of 5"  . INGUINAL HERNIA REPAIR  2003   right  . JOINT REPLACEMENT Right 04/03/2002   hip replacment  . KNEE ARTHROSCOPY  1990's   left  . LEFT HEART CATHETERIZATION WITH CORONARY ANGIOGRAM N/A 09/30/2011   Procedure: LEFT HEART CATHETERIZATION WITH CORONARY ANGIOGRAM;  Surgeon: Jettie Booze, MD;  Location: [facility] CATH LAB;  Service: Cardiovascular;  Laterality: N/A;  possible PCI  . LEFT HEART CATHETERIZATION WITH CORONARY ANGIOGRAM N/A 11/15/2011   Procedure: LEFT HEART CATHETERIZATION WITH CORONARY ANGIOGRAM;  Surgeon: Jettie Booze, MD;  Location: [facility] CATH LAB;  Service: Cardiovascular;  Laterality: N/A;  . PERCUTANEOUS CORONARY STENT INTERVENTION (PCI-S)  09/30/2011   Procedure: PERCUTANEOUS CORONARY STENT INTERVENTION (PCI-S);  Surgeon: Jettie Booze, MD;  Location: [facility] CATH LAB;  Service: Cardiovascular;;  . PERCUTANEOUS CORONARY STENT INTERVENTION (PCI-S) N/A 11/11/2011   Procedure: PERCUTANEOUS CORONARY STENT INTERVENTION (PCI-S);  Surgeon: Jettie Booze, MD;  Location: [facility] CATH LAB;  Service: Cardiovascular;  Laterality: N/A;  . PERIPHERAL VASCULAR BALLOON ANGIOPLASTY Right 08/08/2020   Procedure: PERIPHERAL VASCULAR BALLOON ANGIOPLASTY;  Surgeon: Angelia Mould, MD;  Location: [place] CV LAB;  Service: Cardiovascular;  Laterality: Right;  Posterior tibial   . SHOULDER SURGERY Right    X 2  . STUMP REVISION Right 01/09/2021   Procedure: REVISION RIGHT BELOW KNEE AMPUTATION;  Surgeon: [name], MD;  Location: [place];  Service: Orthopedics;  Laterality: Right;  . TONSILLECTOMY  ~ 1948  . TOTAL HIP REVISION Right 04/13/2019   Procedure: RIGHT TOTAL HIP REVISION-POSTERIOR  APPROACH LATERAL;  Surgeon: Marybelle Killings, MD;  Location: [place];  Service: Orthopedics;  Laterality: Right;  . TOTAL KNEE ARTHROPLASTY Left 07/23/2016   Procedure: LEFT TOTAL KNEE ARTHROPLASTY;  Surgeon: Marybelle Killings, MD;  Location: [place];  Service: Orthopedics;  Laterality: Left;     Social History:  reports that he has quit smoking. His smokeless tobacco use includes chew. He reports that he does not drink alcohol and does not use drugs.   Allergies  Allergen Reactions  . Simvastatin Other (See Comments)    SEVERE MYALGIAS   . Zetia [Ezetimibe] Other (See Comments)    MYALGIAS  . Dilaudid [Hydromorphone Hcl] Other (See Comments)    hallucination    Family History  Problem Relation Age of Onset  . Diabetes Mother   . Hyperlipidemia Mother   . Hypertension Mother   . Cancer Father   . Hypertension Father   . Diabetes Sister   . Hypertension Sister   . Cancer Brother   . Heart attack Neg Hx       Prior to Admission medications   Medication Sig Start Date End Date Taking? Authorizing Provider  acetaminophen (TYLENOL) 500 MG  tablet Take 1,000 mg by mouth every 6 (six) hours as needed (pain).   Yes [provider]  allopurinol (ZYLOPRIM) 300 MG tablet Take 300 mg by mouth daily.   Yes [provider]  amLODipine (NORVASC) 5 MG tablet Take 1 tablet (5 mg total) by mouth daily. 12/26/20  Yes Love, Evlyn Kanneramela S, PA-C  aspirin EC 81 MG tablet Take 81 mg by mouth daily.   Yes [provider]  Cholecalciferol (VITAMIN D3) 50 MCG (2000 UT) TABS Take 2,000 Units by mouth daily.    Yes [provider]  clopidogrel (PLAVIX) 75 MG tablet Take 1 tablet (75 mg total) by mouth daily. 12/26/20  Yes Love, Evlyn KannerPamela S, PA-C  Cyanocobalamin (B-12) 2500 MCG TABS Take 2,500 mcg by mouth daily.   Yes [provider]  empagliflozin (JARDIANCE) 25 MG TABS tablet Take 25 mg by mouth daily.   Yes [provider]  gabapentin (NEURONTIN) 100 MG capsule Take 2 capsules (200 mg total) by mouth at bedtime. Patient taking differently: Take 600 mg by mouth at bedtime. 12/26/20  Yes Love, Evlyn KannerPamela S, PA-C  gabapentin (NEURONTIN) 400 MG capsule Take 1 capsule (400 mg total) by mouth 3 (three) times daily. Patient taking differently: Take 400 mg  by mouth daily. 12/26/20  Yes Love, Evlyn KannerPamela S, PA-C  hydrochlorothiazide (HYDRODIURIL) 12.5 MG tablet Take 1 tablet (12.5 mg total) by mouth daily. 12/26/20  Yes Love, Evlyn KannerPamela S, PA-C  insulin detemir (LEVEMIR FLEXTOUCH) 100 UNIT/ML FlexPen Inject 15-20 Units into the skin at bedtime. Dose is based on CBG   Yes [provider]  isosorbide mononitrate (IMDUR) 30 MG 24 hr tablet Take 1 tablet (30 mg total) by mouth daily. Please schedule yearly appointment for future refills. Thank you 12/26/20  Yes Love, Evlyn Kanneramela S, PA-C  losartan (COZAAR) 50 MG tablet Take 1 tablet (50 mg total) by mouth daily. 12/26/20  Yes Love, Evlyn KannerPamela S, PA-C  metFORMIN (GLUCOPHAGE) 1000 MG tablet Take 1 tablet (1,000 mg total) by mouth 2 (two) times daily. 11/13/11  Yes Corky CraftsVaranasi, Jayadeep S, MD  methocarbamol (ROBAXIN) 500 MG tablet Take 1 tablet (500 mg total) by mouth every 6 (six) hours as needed for muscle spasms. 12/26/20  Yes Love, Evlyn KannerPamela S, PA-C  nitroGLYCERIN (NITROSTAT) 0.4 MG SL tablet Place 0.4 mg under the tongue every 5 (five) minutes x 3 doses as needed for chest pain.    Yes [provider]  ONE TOUCH ULTRA TEST test strip CHECK BLOOD SUGAR ONCE DAILY AS DIRECTED 09/23/17  Yes [provider]  oxyCODONE (OXY IR/ROXICODONE) 5 MG immediate release tablet Take 1 tablet (5 mg total) by mouth every 4 (four) hours as needed for severe pain. 01/12/21  Yes Nadara Mustarduda, Marcus V, MD  polyethylene glycol (MIRALAX) 17 g packet Take 17 g by mouth daily. 01/30/21  Yes Long, Arlyss RepressJoshua G, MD  senna-docusate (SENOKOT-S) 8.6-50 MG tablet Take 1 tablet by mouth at bedtime as needed for mild constipation or moderate constipation. 01/30/21  Yes Long, Arlyss RepressJoshua G, MD  terazosin (HYTRIN) 5 MG capsule Take 1 capsule (5 mg total) by mouth at bedtime. 12/26/20  Yes Love, Evlyn KannerPamela S, PA-C  traMADol (ULTRAM) 50 MG tablet Take 1 tablet (50 mg total) by mouth every 6 (six) hours as needed for moderate pain. 12/26/20  Yes Love, Evlyn KannerPamela S, PA-C   traZODone (DESYREL) 50 MG tablet Take 0.5-1 tablets (25-50 mg total) by mouth at bedtime as needed for sleep. Patient taking differently: Take 50 mg by  mouth at bedtime as needed for sleep. 12/26/20  Yes Love, Ivan Anchors, PA-C  doxycycline (VIBRA-TABS) 100 MG tablet Take 1 tablet (100 mg total) by mouth every 12 (twelve) hours. Patient not taking: Reported on 01/31/2021 12/30/20   [name], MD    Physical Exam: BP 133/70   Pulse 81   Temp 98.6 F (37 C) (Oral)   Resp 15   SpO2 97%   . General: 80 y.o. year-old male frail-appearing in no acute distress.  Alert and oriented x3. . Cardiovascular: Regular rate and rhythm with no rubs or gallops.  No thyromegaly or JVD noted.  No lower extremity edema.  Marland Kitchen Respiratory: Clear to auscultation with no wheezes or rales. Good inspiratory effort. . Abdomen: Soft nontender nondistended with normal bowel sounds x4 quadrants. . Muskuloskeletal: No cyanosis, clubbing or edema noted.  Right below the knee amputation. . Neuro: CN II-XII intact, strength, sensation, reflexes . Skin: No ulcerative lesions noted or rashes . Psychiatry: Judgement and insight appear normal. Mood is appropriate for condition and setting          Labs on Admission:  Basic Metabolic Panel: Recent Labs  Lab 01/31/21 1915  NA 135  K 4.5  CL 95*  CO2 25  GLUCOSE 398*  BUN 22  CREATININE 1.23  CALCIUM 9.3   Liver Function Tests: Recent Labs  Lab 01/31/21 1915  AST 17  ALT 13  ALKPHOS 79  BILITOT 1.2  PROT 6.5  ALBUMIN 3.5   Recent Labs  Lab 01/31/21 1915  LIPASE 28   No results for input(s): AMMONIA in the last 168 hours. CBC: Recent Labs  Lab 01/31/21 1915  WBC 11.6*  HGB 11.2*  HCT 35.9*  MCV 92.5  PLT 187   Cardiac Enzymes: No results for input(s): CKTOTAL, CKMB, CKMBINDEX, TROPONINI in the last 168 hours.  BNP (last 3 results) No results for input(s): BNP in the last 8760 hours.  ProBNP (last 3 results) No results for input(s):  PROBNP in the last 8760 hours.  CBG: No results for input(s): GLUCAP in the last 168 hours.  Radiological Exams on Admission: CT Abdomen Pelvis W Contrast  Result Date: 01/31/2021 CLINICAL DATA:  Constipation for 3 days, abdominal pain EXAM: CT ABDOMEN AND PELVIS WITH CONTRAST TECHNIQUE: Multidetector CT imaging of the abdomen and pelvis was performed using the standard protocol following bolus administration of intravenous contrast. CONTRAST:  43mL OMNIPAQUE IOHEXOL 300 MG/ML  SOLN COMPARISON:  01/30/2021 FINDINGS: Lower chest: No acute pleural or parenchymal lung disease. Hepatobiliary: No focal liver abnormality is seen. Status post cholecystectomy. No biliary dilatation. Pancreas: Unremarkable. No pancreatic ductal dilatation or surrounding inflammatory changes. Spleen: Normal in size without focal abnormality. Adrenals/Urinary Tract: 10 mm indeterminate left adrenal nodule may reflect adenoma. Right adrenals unremarkable. There are punctate bilateral nonobstructing renal calculi, measuring up to 5 mm on the right and 3 mm on the left. No obstructive uropathy within either kidney. There is bilateral renal cortical thinning and scarring. The bladder is unremarkable. Stomach/Bowel: No bowel obstruction or ileus. There is a large amount of fecal retention throughout the colon. Significant stool within the rectal vault consistent with fecal impaction. No bowel wall thickening or inflammatory change. Vascular/Lymphatic: Aortic atherosclerosis. No enlarged abdominal or pelvic lymph nodes. Reproductive: Prostate is unremarkable. Other: There is no free fluid or free gas. No abdominal wall hernia. Musculoskeletal: Stable right hip arthroplasty. No acute or destructive bony lesions. Reconstructed images demonstrate no additional findings. IMPRESSION: 1. Large amount of  retained stool throughout the colon consistent with constipation. Large amount of stool within the rectal vault consistent with fecal impaction.  2. Punctate bilateral nonobstructing renal calculi. 3. Indeterminate 10 mm left adrenal nodule, likely adenoma. 4.  Aortic Atherosclerosis (ICD10-I70.0). Electronically Signed   By: Randa Ngo M.D.   On: 01/31/2021 22:51   DG Abdomen Acute W/Chest  Result Date: 01/30/2021 CLINICAL DATA:  Constipation EXAM: DG ABDOMEN ACUTE WITH 1 VIEW CHEST COMPARISON:  None. FINDINGS: Moderate amount of stool in the transverse and descending colon. No bowel dilatation to suggest obstruction. Large calcification projecting over the left kidney likely reflecting a staghorn calculus. Heart size and mediastinal contours are within normal limits. Both lungs are clear. Right hip arthroplasty. IMPRESSION: 1. Moderate amount of stool in the transverse and descending colon. 2. Large calcification projecting over the left kidney likely reflecting a staghorn calculus. Electronically Signed   By: Kathreen Devoid   On: 01/30/2021 20:24    EKG: I independently viewed the EKG done and my findings are as followed: None available at time of assessment.  Assessment/Plan Present on Admission: . Fecal impaction [facility])  Active Problems:   Fecal impaction (HCC)  Fecal impaction from chronic constipation CT abdomen pelvis with contrast done on 01/31/2021:  Large amount of retained stool throughout the colon consistent with constipation.  Large amount of stool within the rectal vault consistent with fecal impaction.  Previously taking opiates until 5 days ago.   He started taking a bowel regimen the day before this admission.   Continue bowel regimen Consult GI in the morning Avoid dehydration, add fiber in diet Mobilize as tolerated  Type 2 diabetes with hyperglycemia Obtain A1c Hold off oral hypoglycemics Start insulin sliding scale Start gentle IV fluid hydration LR at 50 cc/h x 2 days.  Coronary artery disease Denies any anginal symptoms. Resume home regimen Monitor on telemetry  Diabetes polyneuropathy Resume home  gabapentin  GERD No antireflux medications listed on home meds. Monitor  Peripheral vascular disease status post right below the knee amputation. Patient states he receives home health services Resume previous home health services at discharge [facility] consulted to assist    DVT prophylaxis: Subcu Lovenox daily.  Code Status: Full code.  Family Communication: None at bedside.  Disposition Plan: Admit to [place].  With remote telemetry.  Consults called: Please consult GI in the morning.  Admission status: Observation status.   Status is: Observation    Dispo: The patient is from: Home.               Anticipated d/c is to: Home possibly on 02/01/2021 or when GI signs off.               Patient currently not stable for discharge due to persistent symptomatology.    Difficult to place patient, not applicable.       Kayleen Memos MD [facility] Pager [phone]  If 7PM-7AM, please contact night-coverage [email or web address] Password [email or web address]  02/01/2021, 2:20 AM

## 2021-02-01 NOTE — Plan of Care (Signed)
Patient arrived on unit from ED alert, oriented and without acute distress. Settled into room, admission assessment completed. Will continue to monitor according to orders and plan of care.

## 2021-02-01 NOTE — Progress Notes (Signed)
Rectal suppository and magnesium citrate both administered this morning for constipation with no significant results noted. Lactulose enema requested from pharmacy for administration

## 2021-02-01 NOTE — ED Notes (Signed)
Report given to melanie RN

## 2021-02-01 NOTE — Progress Notes (Signed)
Patient refused to let this writer take shrinker on right LE stump off to perform an assessment of the skin. He stated that he would rather it be done by his daughter when she comes up to visit because she does it daily and it does not hurt when she does it. He stated that the skin was intact without issue. Will continue to monitor.

## 2021-02-01 NOTE — ED Notes (Signed)
Attempted report x1. 

## 2021-02-01 NOTE — ED Notes (Signed)
Pt passed a hard golf ball sized stool.

## 2021-02-01 NOTE — ED Provider Notes (Signed)
Care assumed from Dr. Langston Masker at shift change.  Patient is awaiting results of enema.  Patient has been constipated for the past 4 days and has had no relief with multiple home remedies, medications, and interventions here in the ED.  Patient unable to stool and will likely require admission for further assistance.  I spoke with Dr. Nevada Crane who agrees to admit.   Veryl Speak, MD 02/01/21 [phone]

## 2021-02-01 NOTE — Evaluation (Signed)
Physical Therapy Evaluation Patient Details Name: Keith Espinoza MRN: [id number] DOB: July 10, 1941 Today's Date: 02/01/2021   History of Present Illness  Keith Espinoza is a 80 y.o. male with medical history significant for R BKA follows with orthopedic surgery, Dr. Sharol Given outpatient, essential hypertension, type 2 diabetes, diabetic polyneuropathy, gout, BPH, coronary artery disease, peripheral vascular disease, who presented to [facility] ED due to intermittent abdominal pain and constipation for 4 days duration.  Clinical Impression   Pt admitted with above diagnosis. Comes from home where he lives independently in a fully furnished basement of daughter's home; Uses a wheelchair for mobility at baseline; Presents to PT with abdominal discomfort related to lack of BM, and notable hamstring tightness RLE, which will complicate prosthesis training; able to stand to rW with min assist, and almost initiated R hip extension exercises, but onset of abdominal discomfort got in the way; Pt currently with functional limitations due to the deficits listed below (see PT Problem List). Pt will benefit from skilled PT to increase their independence and safety with mobility to allow discharge to the venue listed below.     Discussed benefit of positioning self in prone for hip flexor stretch, and importance of hamstring stretch to help with prosthesis training; Needs reinforcement    Follow Up Recommendations Home health PT    Equipment Recommendations  None recommended by PT    Recommendations for Other Services       Precautions / Restrictions Precautions Precautions: Fall Precaution Comments: R BKA Restrictions RLE Weight Bearing: Non weight bearing      Mobility  Bed Mobility Overal bed mobility: Modified Independent             General bed mobility comments: Mod I for use of hospital bed railing    Transfers Overall transfer level: Needs assistance Equipment used: Rolling walker (2  wheeled) Transfers: Sit to/from [employer] Sit to Stand: Min assist   Squat pivot transfers: Min guard     General transfer comment: Stood to rW with cues for hand placement and overall good rise; Perfomred bed to bSC to recliner squat pivot trnasfers  Ambulation/Gait                Stairs            Wheelchair Mobility    Modified Rankin (Stroke Patients Only)       Balance     Sitting balance-Leahy Scale: Good       Standing balance-Leahy Scale: Poor                               Pertinent Vitals/Pain Pain Assessment: Faces Faces Pain Scale: Hurts whole lot Pain Location: lower abdomen Pain Descriptors / Indicators: Crying;Discomfort;Guarding Pain Intervention(s): Monitored during session;Repositioned    Home Living Family/patient expects to be discharged to:: Private residence Living Arrangements: Children;Other relatives Available Help at Discharge: Family;Friend(s);Available PRN/intermittently Type of Home: House Home Access: Level entry     Home Layout: One level (pt lives in basement) Home Equipment: Walker - 2 wheels;Cane - single point;Bedside commode;Shower seat;Wheelchair - manual;Wheelchair - power;Grab bars - toilet;Grab bars - tub/shower Additional Comments: Spouse passed in early April    Prior Function Level of Independence: Independent with assistive device(s)         Comments: wc level     Hand Dominance   Dominant Hand: Right    Extremity/Trunk Assessment   Upper Extremity Assessment  Upper Extremity Assessment: Overall WFL for tasks assessed    Lower Extremity Assessment Lower Extremity Assessment: RLE deficits/detail RLE Deficits / Details: R BKA; signs of hamstring tightness       Communication   Communication: No difficulties  Cognition Arousal/Alertness: Awake/alert Behavior During Therapy: WFL for tasks assessed/performed Overall Cognitive Status: No family/caregiver present  to determine baseline cognitive functioning                                 General Comments: most likely at baseline      General Comments General comments (skin integrity, edema, etc.): Standing tolerance limited by discomfort related to the need to stool; so sat back down and assisted pt to [facility]; Ben RN joined [place] to give meds and suppository; assisted pt to the recliner    Exercises     Assessment/Plan    PT Assessment Patient needs continued PT services  PT Problem List Decreased strength;Decreased range of motion;Decreased activity tolerance;Decreased mobility;Decreased balance;Pain       PT Treatment Interventions DME instruction;Gait training;Stair training;Functional mobility training;Therapeutic activities;Therapeutic exercise;Balance training;Neuromuscular re-education;Patient/family education    PT Goals (Current goals can be found in the Care Plan section)  Acute Rehab PT Goals Patient Stated Goal: move his bowels and go home; Does want to get a prosthesis when residual limb heals PT Goal Formulation: With patient Time For Goal Achievement: 02/15/21 Potential to Achieve Goals: Good    Frequency Min 3X/week   Barriers to discharge        Co-evaluation               AM-PAC PT "6 Clicks" Mobility  Outcome Measure Help needed turning from your back to your side while in a flat bed without using bedrails?: None Help needed moving from lying on your back to sitting on the side of a flat bed without using bedrails?: None Help needed moving to and from a bed to a chair (including a wheelchair)?: A Little Help needed standing up from a chair using your arms (e.g., wheelchair or bedside chair)?: A Little Help needed to walk in hospital room?: A Little Help needed climbing 3-5 steps with a railing? : A Lot 6 Click Score: 19    End of Session Equipment Utilized During Treatment: Gait belt Activity Tolerance: Patient tolerated treatment well;Other  (comment) (thoguh quite uncomfortable with lack of a bowel movement) Patient left: in chair;with call bell/phone within reach;with chair alarm set Nurse Communication: Mobility status PT Visit Diagnosis: Unsteadiness on feet (R26.81);Other abnormalities of gait and mobility (R26.89);Pain Pain - Right/Left: Right Pain - part of body: Leg    Time: 1220-1250 PT Time Calculation (min) (ACUTE ONLY): 30 min   Charges:   PT Evaluation $PT Eval Moderate Complexity: 1 Mod PT Treatments $Therapeutic Activity: 8-22 mins        Roney Marion, PT  Acute Rehabilitation Services Pager [phone] Office [phone]   Colletta [place] 02/01/2021, 4:44 PM

## 2021-02-01 NOTE — Progress Notes (Signed)
  PROGRESS NOTE    Keith Espinoza  KGU:[id number] DOB: Jun 28, 1941 DOA: 01/31/2021  PCP: [name], MD    LOS - 0    Patient admitted overnight with fecal impaction  Interval subjective: Pt seen this AM, reports fecal incontinence in the bed, but RN says no BM's yet.  He seems maybe passing liquid stool around solid impacted stool.  No solid BM yet.  He has no other acute medical complaints.  He wants a wheelchair and to have abliity to be up ad lib with wheelchair in his room.  Explained safety precautions.  Discussed bowel regimen with RN and orders updated.  Exam: awake, alert, no acute distress, lungs CTAB, heart sounds RRR, extremities R BKA, no peripheral edema, abdomen sunken non-tender, +bowel sounds   Active Problems:   Fecal impaction (HCC)    I have reviewed the full H&P by Dr. [place] Crane in detail, and I agree with the assessment and plan as outlined therein. In addition: --PRN suppository and enemas ordered --Try Mag Citrate --Can possibly d/c later today if he clears impaction   No Charge    Ezekiel Slocumb, DO [facility]   If 7PM-7AM, please contact night-coverage [email or web address] 02/01/2021, 7:21 AM

## 2021-02-01 NOTE — Progress Notes (Signed)
Pt had a moderate bowel movement this pm

## 2021-02-02 DIAGNOSIS — I5032 Chronic diastolic (congestive) heart failure: Secondary | ICD-10-CM | POA: Diagnosis present

## 2021-02-02 DIAGNOSIS — E1142 Type 2 diabetes mellitus with diabetic polyneuropathy: Secondary | ICD-10-CM | POA: Diagnosis present

## 2021-02-02 DIAGNOSIS — E1165 Type 2 diabetes mellitus with hyperglycemia: Secondary | ICD-10-CM | POA: Diagnosis present

## 2021-02-02 DIAGNOSIS — I110 Hypertensive heart disease with heart failure: Secondary | ICD-10-CM | POA: Diagnosis present

## 2021-02-02 DIAGNOSIS — E1151 Type 2 diabetes mellitus with diabetic peripheral angiopathy without gangrene: Secondary | ICD-10-CM | POA: Diagnosis present

## 2021-02-02 DIAGNOSIS — I252 Old myocardial infarction: Secondary | ICD-10-CM | POA: Diagnosis not present

## 2021-02-02 DIAGNOSIS — I2510 Atherosclerotic heart disease of native coronary artery without angina pectoris: Secondary | ICD-10-CM | POA: Diagnosis present

## 2021-02-02 DIAGNOSIS — E876 Hypokalemia: Secondary | ICD-10-CM | POA: Diagnosis not present

## 2021-02-02 DIAGNOSIS — J309 Allergic rhinitis, unspecified: Secondary | ICD-10-CM | POA: Diagnosis present

## 2021-02-02 DIAGNOSIS — Z96641 Presence of right artificial hip joint: Secondary | ICD-10-CM | POA: Diagnosis present

## 2021-02-02 DIAGNOSIS — E538 Deficiency of other specified B group vitamins: Secondary | ICD-10-CM | POA: Diagnosis present

## 2021-02-02 DIAGNOSIS — K5900 Constipation, unspecified: Secondary | ICD-10-CM | POA: Diagnosis not present

## 2021-02-02 DIAGNOSIS — K219 Gastro-esophageal reflux disease without esophagitis: Secondary | ICD-10-CM | POA: Diagnosis present

## 2021-02-02 DIAGNOSIS — Z720 Tobacco use: Secondary | ICD-10-CM | POA: Diagnosis not present

## 2021-02-02 DIAGNOSIS — I10 Essential (primary) hypertension: Secondary | ICD-10-CM | POA: Diagnosis not present

## 2021-02-02 DIAGNOSIS — Z85828 Personal history of other malignant neoplasm of skin: Secondary | ICD-10-CM | POA: Diagnosis not present

## 2021-02-02 DIAGNOSIS — M109 Gout, unspecified: Secondary | ICD-10-CM | POA: Diagnosis present

## 2021-02-02 DIAGNOSIS — Z20822 Contact with and (suspected) exposure to covid-19: Secondary | ICD-10-CM | POA: Diagnosis present

## 2021-02-02 DIAGNOSIS — Z794 Long term (current) use of insulin: Secondary | ICD-10-CM | POA: Diagnosis not present

## 2021-02-02 DIAGNOSIS — Z888 Allergy status to other drugs, medicaments and biological substances status: Secondary | ICD-10-CM | POA: Diagnosis not present

## 2021-02-02 DIAGNOSIS — Z96652 Presence of left artificial knee joint: Secondary | ICD-10-CM | POA: Diagnosis present

## 2021-02-02 DIAGNOSIS — Z89511 Acquired absence of right leg below knee: Secondary | ICD-10-CM | POA: Diagnosis not present

## 2021-02-02 DIAGNOSIS — Z955 Presence of coronary angioplasty implant and graft: Secondary | ICD-10-CM | POA: Diagnosis not present

## 2021-02-02 DIAGNOSIS — K5641 Fecal impaction: Secondary | ICD-10-CM | POA: Diagnosis present

## 2021-02-02 DIAGNOSIS — E1169 Type 2 diabetes mellitus with other specified complication: Secondary | ICD-10-CM | POA: Diagnosis not present

## 2021-02-02 DIAGNOSIS — N400 Enlarged prostate without lower urinary tract symptoms: Secondary | ICD-10-CM | POA: Diagnosis present

## 2021-02-02 LAB — HEMOGLOBIN A1C
Hgb A1c MFr Bld: 7.5 % — ABNORMAL HIGH (ref 4.8–5.6)
Mean Plasma Glucose: 168.55 mg/dL

## 2021-02-02 LAB — GLUCOSE, CAPILLARY
Glucose-Capillary: 216 mg/dL — ABNORMAL HIGH (ref 70–99)
Glucose-Capillary: 243 mg/dL — ABNORMAL HIGH (ref 70–99)
Glucose-Capillary: 240 mg/dL — ABNORMAL HIGH (ref 70–99)
Glucose-Capillary: 227 mg/dL — ABNORMAL HIGH (ref 70–99)

## 2021-02-02 MED ORDER — MAGNESIUM CITRATE PO SOLN
1.0000 | Freq: Once | ORAL | Status: AC
  Administered 2021-02-02: 1 via ORAL
  Filled 2021-02-02: qty 296

## 2021-02-02 MED ORDER — BISACODYL 10 MG RE SUPP
10.0000 mg | Freq: Every day | RECTAL | Status: DC
  Filled 2021-02-02: qty 1

## 2021-02-02 MED ORDER — POLYETHYLENE GLYCOL 3350 17 G PO PACK
17.0000 g | PACK | Freq: Two times a day (BID) | ORAL | Status: DC
  Administered 2021-02-03: 17 g via ORAL
  Filled 2021-02-02: qty 1

## 2021-02-02 MED ORDER — INSULIN DETEMIR 100 UNIT/ML ~~LOC~~ SOLN
10.0000 [IU] | Freq: Every day | SUBCUTANEOUS | Status: DC
  Administered 2021-02-02: 10 [IU] via SUBCUTANEOUS
  Filled 2021-02-02 (×2): qty 0.1

## 2021-02-02 NOTE — Progress Notes (Signed)
PROGRESS NOTE  Keith Espinoza EGB:[id number] DOB: 02-13-1941 DOA: 01/31/2021 PCP: [name], MD   LOS: 0 days   Brief narrative:  Keith Espinoza is a 80 y.o. male with medical history significant of right below-knee amputation , essential hypertension, type 2 diabetes, gout, BPH, coronary artery disease, peripheral vascular disease, presented to hospital with intermittent abdominal pain and constipation for 4 days.  Normally has a bowel movement every 3 days.  Patient has however remained mobile despite his right below-knee amputation.  Abdominal x-ray revealed stool burden without small bowel obstruction for which he was prescribed laxatives and shown home but did not improve so he decided to come back to the hospital.  In the ED CT scan of the abdomen and pelvis was performed which showed large amount of stool in the colon consistent with constipation.  Assessment/Plan:  Active Problems:   Fecal impaction (HCC)  Fecal impaction from chronic constipation Patient continues to have intermittent colicky severe abdominal pain.  He has had some episodes of bowel movements but not big ones.  We will continue with MiraLAX twice daily, add magnesium citrate x1.  Patient received lactulose enema yesterday.  We will add soapsuds enema.  We will continue to monitor for stool output.  Patient is on narcotics at baseline and will need good bowel regimen on discharge.  Type 2 diabetes with hyperglycemia Hemoglobin A1c was 7.5.  Continue with sliding scale insulin.  We will add long-acting insulin starting tonight.  Continue with gentle fluid hydration as well.  Diabetic diet.    Coronary artery disease Continue Plavix, Imdur.  Diabetes polyneuropathy Continue gabapentin.  GERD No medications at home.  We will continue to monitor  Peripheral vascular disease status post right below the knee amputation. With home health services at home.   DVT prophylaxis: enoxaparin (LOVENOX) injection  30 mg Start: 02/01/21 1400 SCDs Start: 02/01/21 0217    Code Status: Full code  Family Communication: None  Status is: Observation  The patient will require care spanning > 2 midnights and should be moved to inpatient because: IV treatments appropriate due to intensity of illness or inability to take PO, Inpatient level of care appropriate due to severity of illness and Persistent abdominal pain and symptoms,  Dispo:  Patient From: Home  Planned Disposition: Home with Health Care Svc likely by tomorrow  Medically stable for discharge: No      Consultants:  None  Procedures:  None  Anti-infectives:  . None  Anti-infectives (From admission, onward)   None       Subjective: Today, patient was seen and examined at bedside.  Continues to have intermittent colicky pain every 15 minutes or so.  Has had a small bowel movement despite multiple interventions.  Objective: Vitals:   02/02/21 0434 02/02/21 1346  BP: (!) 122/59 (!) 88/49  Pulse: 62 80  Resp: 16 18  Temp: 98.5 F (36.9 C) (!) 97.5 F (36.4 C)  SpO2: 98% 98%    Intake/Output Summary (Last 24 hours) at 02/02/2021 1509 Last data filed at 02/02/2021 0452 Gross per 24 hour  Intake 120 ml  Output 1000 ml  Net -880 ml   There were no vitals filed for this visit. There is no height or weight on file to calculate BMI.   Physical Exam: GENERAL: Patient is alert awake and oriented. Not in obvious distress. HENT: No scleral pallor or icterus. Pupils equally reactive to light. Oral mucosa is moist NECK: is supple, no gross swelling noted.  CHEST: Clear to auscultation. No crackles or wheezes.  Diminished breath sounds bilaterally. CVS: S1 and S2 heard, no murmur. Regular rate and rhythm.  ABDOMEN: Soft, non-tender, bowel sounds are present.  Mild lump present over the colonic area with history of fecal retention. EXTREMITIES: No edema.  Status post right below-knee amputation. CNS: Cranial nerves are intact. No  focal motor deficits. SKIN: warm and dry without rashes.  Data Review: I have personally reviewed the following laboratory data and studies,  CBC: Recent Labs  Lab 01/31/21 1915 02/01/21 0625  WBC 11.6* 9.3  HGB 11.2* 10.6*  HCT 35.9* 32.1*  MCV 92.5 89.4  PLT 187 638   Basic Metabolic Panel: Recent Labs  Lab 01/31/21 1915 02/01/21 0625  NA 135 138  K 4.5 3.9  CL 95* 95*  CO2 25 32  GLUCOSE 398* 231*  BUN 22 23  CREATININE 1.23 1.11  CALCIUM 9.3 9.1  MG  --  2.7*  PHOS  --  3.8   Liver Function Tests: Recent Labs  Lab 01/31/21 1915  AST 17  ALT 13  ALKPHOS 79  BILITOT 1.2  PROT 6.5  ALBUMIN 3.5   Recent Labs  Lab 01/31/21 1915  LIPASE 28   No results for input(s): AMMONIA in the last 168 hours. Cardiac Enzymes: No results for input(s): CKTOTAL, CKMB, CKMBINDEX, TROPONINI in the last 168 hours. BNP (last 3 results) No results for input(s): BNP in the last 8760 hours.  ProBNP (last 3 results) No results for input(s): PROBNP in the last 8760 hours.  CBG: Recent Labs  Lab 02/01/21 1205 02/01/21 1722 02/01/21 2103 02/02/21 0813 02/02/21 1147  GLUCAP 214* 273* 206* 216* 243*   Recent Results (from the past 240 hour(s))  Resp Panel by RT-PCR (Flu A&B, Covid) Nasopharyngeal Swab     Status: None   Collection Time: 02/01/21  2:24 AM   Specimen: Nasopharyngeal Swab; Nasopharyngeal(NP) swabs in vial transport medium  Result Value Ref Range Status   SARS Coronavirus 2 by RT PCR NEGATIVE NEGATIVE Final    Comment: (NOTE) SARS-CoV-2 target nucleic acids are NOT DETECTED.  The SARS-CoV-2 RNA is generally detectable in upper respiratory specimens during the acute phase of infection. The lowest concentration of SARS-CoV-2 viral copies this assay can detect is 138 copies/mL. A negative result does not preclude SARS-Cov-2 infection and should not be used as the sole basis for treatment or other patient management decisions. A negative result may occur  with  improper specimen collection/handling, submission of specimen other than nasopharyngeal swab, presence of viral mutation(s) within the areas targeted by this assay, and inadequate number of viral copies(<138 copies/mL). A negative result must be combined with clinical observations, patient history, and epidemiological information. The expected result is Negative.  Fact Sheet for Patients:  EntrepreneurPulse.com.au  Fact Sheet for Healthcare Providers:  IncredibleEmployment.be  This test is no t yet approved or cleared by the [place] FDA and  has been authorized for detection and/or diagnosis of SARS-CoV-2 by FDA under an Emergency Use Authorization (EUA). This EUA will remain  in effect (meaning this test can be used) for the duration of the COVID-19 declaration under Section 564(b)(1) of the Act, 21 U.S.C.section 360bbb-3(b)(1), unless the authorization is terminated  or revoked sooner.       Influenza A by PCR NEGATIVE NEGATIVE Final   Influenza B by PCR NEGATIVE NEGATIVE Final    Comment: (NOTE) The Xpert Xpress SARS-CoV-2/FLU/RSV plus assay is intended as an aid in the diagnosis  of influenza from Nasopharyngeal swab specimens and should not be used as a sole basis for treatment. Nasal washings and aspirates are unacceptable for Xpert Xpress SARS-CoV-2/FLU/RSV testing.  Fact Sheet for Patients: EntrepreneurPulse.com.au  Fact Sheet for Healthcare Providers: IncredibleEmployment.be  This test is not yet approved or cleared by the [place] FDA and has been authorized for detection and/or diagnosis of SARS-CoV-2 by FDA under an Emergency Use Authorization (EUA). This EUA will remain in effect (meaning this test can be used) for the duration of the COVID-19 declaration under Section 564(b)(1) of the Act, 21 U.S.C. section 360bbb-3(b)(1), unless the authorization is terminated  or revoked.  Performed at [facility], [place] [address], [place], [place] [address]      Studies: CT Abdomen Pelvis W Contrast  Result Date: 01/31/2021 CLINICAL DATA:  Constipation for 3 days, abdominal pain EXAM: CT ABDOMEN AND PELVIS WITH CONTRAST TECHNIQUE: Multidetector CT imaging of the abdomen and pelvis was performed using the standard protocol following bolus administration of intravenous contrast. CONTRAST:  60mL OMNIPAQUE IOHEXOL 300 MG/ML  SOLN COMPARISON:  01/30/2021 FINDINGS: Lower chest: No acute pleural or parenchymal lung disease. Hepatobiliary: No focal liver abnormality is seen. Status post cholecystectomy. No biliary dilatation. Pancreas: Unremarkable. No pancreatic ductal dilatation or surrounding inflammatory changes. Spleen: Normal in size without focal abnormality. Adrenals/Urinary Tract: 10 mm indeterminate left adrenal nodule may reflect adenoma. Right adrenals unremarkable. There are punctate bilateral nonobstructing renal calculi, measuring up to 5 mm on the right and 3 mm on the left. No obstructive uropathy within either kidney. There is bilateral renal cortical thinning and scarring. The bladder is unremarkable. Stomach/Bowel: No bowel obstruction or ileus. There is a large amount of fecal retention throughout the colon. Significant stool within the rectal vault consistent with fecal impaction. No bowel wall thickening or inflammatory change. Vascular/Lymphatic: Aortic atherosclerosis. No enlarged abdominal or pelvic lymph nodes. Reproductive: Prostate is unremarkable. Other: There is no free fluid or free gas. No abdominal wall hernia. Musculoskeletal: Stable right hip arthroplasty. No acute or destructive bony lesions. Reconstructed images demonstrate no additional findings. IMPRESSION: 1. Large amount of retained stool throughout the colon consistent with constipation. Large amount of stool within the rectal vault consistent with fecal impaction. 2. Punctate bilateral  nonobstructing renal calculi. 3. Indeterminate 10 mm left adrenal nodule, likely adenoma. 4.  Aortic Atherosclerosis (ICD10-I70.0). Electronically Signed   By: Randa Ngo M.D.   On: 01/31/2021 22:51      Flora Lipps, MD  [facility] 02/02/2021  If 7PM-7AM, please contact night-coverage

## 2021-02-02 NOTE — Evaluation (Signed)
Occupational Therapy Evaluation Patient Details Name: Keith Espinoza MRN: [id number] DOB: [birth year] Today's Date: 02/02/2021    History of Present Illness Keith Espinoza is a 80 y.o. male with medical history significant for R BKA follows with orthopedic surgery, Dr. Sharol Given outpatient, essential hypertension, type 2 diabetes, diabetic polyneuropathy, gout, BPH, coronary artery disease, peripheral vascular disease, who presented to [facility] ED due to intermittent abdominal pain and constipation for 4 days duration.   Clinical Impression   This 80 yo male admitted with above presents to acute OT with PLOF of being Mod I at a W/C level for basic ADLs. He currently is setup/S-total A(posterior peri area cleaning) for basic ADLs. He will continue to benefit from acute OT with follow up [place] once impaction is cleared.    Follow Up Recommendations  Home health OT;Supervision - Intermittent    Equipment Recommendations  None recommended by OT       Precautions / Restrictions Precautions Precautions: Fall Precaution Comments: R BKA Restrictions Weight Bearing Restrictions: Yes RLE Weight Bearing: Non weight bearing      Mobility Bed Mobility Overal bed mobility: Modified Independent             General bed mobility comments: Mod I for use of hospital bed railing (has an adjustable bed at home, but no rail)    Transfers Overall transfer level: Needs assistance   Transfers: Squat Pivot Transfers     Squat pivot transfers: Min guard          Balance Overall balance assessment: Needs assistance Sitting-balance support: No upper extremity supported;Feet supported Sitting balance-Leahy Scale: Good                                     ADL either performed or assessed with clinical judgement   ADL Overall ADL's : Needs assistance/impaired Eating/Feeding: Independent   Grooming: Set up;Sitting   Upper Body Bathing: Set up;Sitting   Lower Body Bathing: Set  up;Sitting/lateral leans   Upper Body Dressing : Set up;Sitting   Lower Body Dressing: Set up;Sitting/lateral leans   Toilet Transfer: Min guard;Squat-pivot   Toileting- Clothing Manipulation and Hygiene: Total assistance Toileting - Clothing Manipulation Details (indicate cue type and reason): on BSC with pt able to hold himself up with BUEs while I assisted with cleaning             Vision Patient Visual Report: No change from baseline              Pertinent Vitals/Pain Pain Assessment: 0-10 Pain Score: 10-Worst pain ever Pain Location: lower abdomen intermittently Pain Descriptors / Indicators: Grimacing;Guarding;Moaning Pain Intervention(s): Limited activity within patient's tolerance;Monitored during session;Repositioned     Hand Dominance Right   Extremity/Trunk Assessment Upper Extremity Assessment Upper Extremity Assessment: Overall WFL for tasks assessed           Communication Communication Communication: No difficulties   Cognition Arousal/Alertness: Awake/alert Behavior During Therapy: WFL for tasks assessed/performed Overall Cognitive Status: Within Functional Limits for tasks assessed                                                Home Living Family/patient expects to be discharged to:: Private residence Living Arrangements: Children;Other relatives Available Help at Discharge: Family;Friend(s);Available PRN/intermittently Type of Home:  House Home Access: Level entry     Home Layout: One level (pt lives in basement')     Bathroom Shower/Tub: Occupational psychologist: Standard Bathroom Accessibility: Yes How Accessible: Accessible via wheelchair;Accessible via walker Home Equipment: [place] - 2 wheels;Cane - single point;Bedside commode;Shower seat;Wheelchair - manual;Wheelchair - power;Grab bars - toilet;Grab bars - tub/shower   Additional Comments: Spouse passed in early April 2022--she decided to stop  dialysis, has a cat named Coda      Prior Functioning/Environment Level of Independence: Independent with assistive device(s)        Comments: wc level        OT Problem List: Decreased activity tolerance;Impaired balance (sitting and/or standing);Pain      OT Treatment/Interventions: Self-care/ADL training;DME and/or AE instruction;Patient/family education;Balance training    OT Goals(Current goals can be found in the care plan section) Acute Rehab OT Goals Patient Stated Goal: to not be impacted and be ablet to go home hopefully tomorrow OT Goal Formulation: With patient Time For Goal Achievement: 02/16/21 Potential to Achieve Goals: Good  OT Frequency: Min 2X/week   Barriers to D/C: Decreased caregiver support             AM-PAC OT "6 Clicks" Daily Activity     Outcome Measure Help from another person eating meals?: None Help from another person taking care of personal grooming?: A Little Help from another person toileting, which includes using toliet, bedpan, or urinal?: A Lot Help from another person bathing (including washing, rinsing, drying)?: A Little Help from another person to put on and taking off regular upper body clothing?: A Little Help from another person to put on and taking off regular lower body clothing?: A Little 6 Click Score: 18   End of Session Nurse Communication:  (pt had a small formed bowel movement)  Activity Tolerance: Patient limited by pain (from impaction) Patient left: in bed;with call bell/phone within reach  OT Visit Diagnosis: Other abnormalities of gait and mobility (R26.89);Pain Pain - part of body:  (abdomen and back peri area)                Time: 4268-3419 OT Time Calculation (min): 23 min Charges:  OT General Charges $OT Visit: 1 Visit OT Evaluation $OT Eval Moderate Complexity: 1 Mod OT Treatments $Self Care/Home Management : 8-22 mins  [place], OTR/L Acute [employer] Pager [phone] Office  [phone]     Almon Register 02/02/2021, 9:56 AM

## 2021-02-02 NOTE — Progress Notes (Signed)
Inpatient Diabetes Program Recommendations  AACE/ADA: New Consensus Statement on Inpatient Glycemic Control (2015)  Target Ranges:  Prepandial:   less than 140 mg/dL      Peak postprandial:   less than 180 mg/dL (1-2 hours)      Critically ill patients:  140 - 180 mg/dL   Lab Results  Component Value Date   GLUCAP 243 (H) 02/02/2021   HGBA1C 7.5 (H) 02/01/2021   Results for XANE, AMSDEN (MRN [id number]) as of 02/02/2021 11:59  Ref. Range 02/01/2021 12:05 02/01/2021 17:22 02/01/2021 21:03 02/02/2021 08:13 02/02/2021 11:47  Glucose-Capillary Latest Ref Range: 70 - 99 mg/dL 214 (H) 273 (H) 206 (H) 216 (H) 243 (H)   Review of Glycemic Control  Diabetes history: type 2 Outpatient Diabetes medications: Levemir 15-20 units SSI at HS, Metformin 1000 mg BID, Jardiance 25 mg daily Current orders for Inpatient glycemic control: Novolog SENSITIVE correction scale TID and HS scale  Inpatient Diabetes Program Recommendations:   Noted that patient's blood sugars have been greater than 180 mg/dl.  Recommend adding Levemir 15 units at HS if blood sugars continue to be elevated.   Harvel Ricks RN BSN CDE Diabetes Coordinator Pager: [phone]  8am-5pm

## 2021-02-02 NOTE — TOC Initial Note (Signed)
Transition of Care [facility]) - Initial/Assessment Note    Patient Details  Name: Keith Espinoza MRN: [id number] Date of Birth: [birth year]  [facility] CM/SW Contact:    Marilu Favre, RN Phone Number: 02/02/2021, 12:35 PM  Clinical Narrative:                  Patient from home with family. Recent BKA has all DME. Was active with [facility] [facility] ( [facility] ) prior to discharge and would like to continue having services. NCM confirmed with Ramond Marrow with [facility], patient has HHRN,PT,OT will enter orders for same.  Expected Discharge Plan: [name]     Patient Goals and CMS Choice Patient states their goals for this hospitalization and ongoing recovery are:: to return to home CMS Medicare.gov Compare Post Acute Care list provided to:: Patient Choice offered to / list presented to : Patient  Expected Discharge Plan and Services Expected Discharge Plan: [name]   Discharge Planning Services: CM Consult Post Acute Care Choice: [name] arrangements for the past 2 months: Single Family Home                 DME Arranged: N/A DME Agency: NA       HH Arranged: RN,PT,OT [place] Agency: [place] ([place]) Date [place]: 02/02/21 Time [place]: 1234 Representative spoke with at [place]: [place] Arrangements/Services Living arrangements for the past 2 months: [place] Lives with:: Relatives Patient language and need for interpreter reviewed:: Yes Do you feel safe going back to the place where you live?: Yes      Need for Family Participation in Patient Care: Yes (Comment) Care giver support system in place?: Yes (comment) Current home services: DME,Home OT,Home PT,Home RN Criminal Activity/Legal Involvement Pertinent to Current Situation/Hospitalization: No - Comment as needed  Activities of Daily Living Home Assistive Devices/Equipment: Bedside  commode/3-in-1,Dentures (specify type),Eyeglasses,Wheelchair ADL Screening (condition at time of admission) Patient's cognitive ability adequate to safely complete daily activities?: Yes Is the patient deaf or have difficulty hearing?: No Does the patient have difficulty seeing, even when wearing glasses/contacts?: No Does the patient have difficulty concentrating, remembering, or making decisions?: No Patient able to express need for assistance with ADLs?: Yes Does the patient have difficulty dressing or bathing?: Yes Independently performs ADLs?: No Communication: Independent Dressing (OT): Needs assistance Is this a change from baseline?: Pre-admission baseline Grooming: Needs assistance Is this a change from baseline?: Pre-admission baseline Feeding: Independent Bathing: Needs assistance Is this a change from baseline?: Pre-admission baseline Toileting: Needs assistance Is this a change from baseline?: Pre-admission baseline In/Out Bed: Needs assistance Is this a change from baseline?: Pre-admission baseline Walks in Home: Needs assistance Is this a change from baseline?: Pre-admission baseline Does the patient have difficulty walking or climbing stairs?: Yes Weakness of Legs: Right Weakness of Arms/Hands: None  Permission Sought/Granted   Permission granted to share information with : No              Emotional Assessment Appearance:: Appears stated age Attitude/Demeanor/Rapport: Engaged Affect (typically observed): Accepting Orientation: : Oriented to Self,Oriented to Place,Oriented to  Time,Oriented to Situation Alcohol / Substance Use: Not Applicable Psych Involvement: No (comment)  Admission diagnosis:  Fecal impaction ([place]) [K56.41] Constipation, unspecified constipation type [K59.00] Patient Active Problem List   Diagnosis Date Noted  . Fecal impaction ([place]) 02/01/2021  . Wound dehiscence 01/09/2021  . Dehiscence of  amputation stump ([place])   . Status post  percutaneous transluminal coronary angioplasty 01/06/2021  . Type II diabetes mellitus, uncontrolled ([place]) 01/06/2021  . Acute pancreatitis 01/06/2021  . Diabetic peripheral vascular disease ([place]) 01/06/2021  . Encounter for screening for other disorder 01/06/2021  . Enlarged prostate 01/06/2021  . Foot ulcer, right ([place]) 01/06/2021  . Gout 01/06/2021  . Headache 01/06/2021  . Neck pain 01/06/2021  . Hypoglycemia 01/06/2021  . Loss of appetite 01/06/2021  . 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 Peripheral neuropathy 01/06/2021  . Sciatica 01/06/2021  . Vitamin B12 deficiency 01/06/2021  . Vitamin D deficiency 01/06/2021  . Weakness 01/06/2021  . Diabetes mellitus type 2 with neurological manifestations ([place]) 01/06/2021  . Protein-calorie malnutrition, severe 12/26/2020  . Acute blood loss anemia 12/26/2020  . Prerenal azotemia 12/26/2020  . Below-knee amputation of right lower extremity ([place]) 12/12/2020  . Gangrene of right foot ([place])   . Diabetic neuropathy ([place]) 11/17/2020  . Hyperglycemia due to type 2 diabetes mellitus ([place]) 11/17/2020  . Long term (current) use of insulin ([place]) 11/17/2020  . Obesity 11/17/2020  . 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/2019  . Hip dislocation, right ([place]) 04/13/2019  . Other intervertebral disc degeneration, lumbar region 03/30/2019  . CAD (coronary artery disease) 01/30/2019  . Tobacco abuse 01/30/2019  . 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  . Burn, foot, second degree, left, initial encounter 06/08/2017  . 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 S/P total knee arthroplasty, left 10/26/2016  . Hyperlipidemia 09/04/2014  . Thrombocytopenia ([place])   . Precordial chest pain 04/05/2014  . Coronary atherosclerosis of native coronary artery 10/01/2013  . Other and unspecified hyperlipidemia 10/01/2013  . Essential hypertension, benign 10/01/2013  . Insulin dependent type 2 diabetes mellitus ([place]) 10/01/2013   . Esophageal reflux 10/01/2013  . Hypertrophy of prostate without urinary obstruction and other lower urinary tract symptoms (LUTS) 10/01/2013   PCP:  [name], MD Pharmacy:   [facility] [place], [place] - [address] [place] [address] [place] [address] [place] [address] Phone: [phone] Fax: [phone]     Social Determinants of Health (SDOH) Interventions    Readmission Risk Interventions No flowsheet data found.

## 2021-02-02 NOTE — Progress Notes (Signed)
Physical Therapy Treatment Patient Details Name: Keith Espinoza MRN: [id number] DOB: Jan 16, 1941 Today's Date: 02/02/2021    History of Present Illness ROCKEY GUARINO is a 80 y.o. male who presented to [facility] ED on 01/31/21 due to intermittent abdominal pain and constipation for 4 days duration.  Dx with fecal impaction.  Pt with medical history significant for R BKA (12/12/20), essential hypertension, type 2 diabetes, diabetic polyneuropathy, gout, coronary artery disease, peripheral vascular disease.    PT Comments    Very limited PT session due to bowel incontinence and abdominal pain.  Pt assisted in bed level mobility for PT to get him cleaned up, changed, and bed changed.  Pt reporting feeling weak and painful. He states he did not feel up for OOB mobility at this time.  PT will continue to follow acutely for safe mobility progression.   Follow Up Recommendations  Home health PT     Equipment Recommendations  None recommended by PT    Recommendations for Other Services       Precautions / Restrictions Precautions Precautions: Fall Precaution Comments: R BKA Restrictions Weight Bearing Restrictions: Yes RLE Weight Bearing: Non weight bearing    Mobility  Bed Mobility Overal bed mobility: Modified Independent             General bed mobility comments: light use of rail to roll bill several times for peri care and bed pad change.    Transfers Overall transfer level: Needs assistance   Transfers: Squat Pivot Transfers     Squat pivot transfers: Min guard     General transfer comment: Pt reports not feeling well enough today for OOB to chair or BSC.  Ambulation/Gait                 Stairs             Wheelchair Mobility    Modified Rankin (Stroke Patients Only)       Balance Overall balance assessment: Needs assistance Sitting-balance support: No upper extremity supported;Feet supported Sitting balance-Leahy Scale: Good                                       Cognition Arousal/Alertness: Awake/alert Behavior During Therapy: WFL for tasks assessed/performed Overall Cognitive Status: Within Functional Limits for tasks assessed                                        Exercises      General Comments General comments (skin integrity, edema, etc.): total assist for peri care, bed change, gown change.  Did not want to attempt prone recommended stretches due to abdominal pain.  BM was small and loose/pasty.      Pertinent Vitals/Pain Pain Assessment: Faces Pain Score: 10-Worst pain ever Faces Pain Scale: Hurts even more Pain Location: lower abdomen intermittently Pain Descriptors / Indicators: Grimacing;Guarding;Moaning Pain Intervention(s): Limited activity within patient's tolerance;Monitored during session;Repositioned    Home Living Family/patient expects to be discharged to:: Private residence Living Arrangements: Children;Other relatives Available Help at Discharge: Family;Friend(s);Available PRN/intermittently Type of Home: House Home Access: Level entry   Home Layout: One level (pt lives in [place]: Walker - 2 wheels;Cane - single point;Bedside commode;Shower seat;Wheelchair - manual;Wheelchair - power;Grab bars - toilet;Grab bars - tub/shower Additional Comments: Spouse passed in early April 2022--she  decided to stop dialysis, has a cat named Careers adviser    Prior Function Level of Independence: Independent with assistive device(s)      Comments: wc level   PT Goals (current goals can now be found in the care plan section) Acute Rehab PT Goals Patient Stated Goal: to not be impacted and be ablet to go home, feel stronger Progress towards PT goals: Not progressing toward goals - comment (limited by weakness and abdominal pain)    Frequency    Min 3X/week      PT Plan Current plan remains appropriate    Co-evaluation              AM-PAC PT "6 Clicks"  Mobility   Outcome Measure  Help needed turning from your back to your side while in a flat bed without using bedrails?: A Little Help needed moving from lying on your back to sitting on the side of a flat bed without using bedrails?: A Little Help needed moving to and from a bed to a chair (including a wheelchair)?: A Little Help needed standing up from a chair using your arms (e.g., wheelchair or bedside chair)?: A Little Help needed to walk in hospital room?: A Little Help needed climbing 3-5 steps with a railing? : A Little 6 Click Score: 18    End of Session   Activity Tolerance: Patient limited by pain Patient left: in bed;with call bell/phone within reach   PT Visit Diagnosis: Unsteadiness on feet (R26.81);Other abnormalities of gait and mobility (R26.89);Pain;Muscle weakness (generalized) (M62.81) Pain - Right/Left:  (middle) Pain - part of body:  (abdomen)     Time: 1245-8099 PT Time Calculation (min) (ACUTE ONLY): 12 min  Charges:  $Therapeutic Activity: 8-22 mins                     Verdene Lennert, PT, DPT  Acute Rehabilitation [phone] pager [phone]) [phone] office

## 2021-02-03 DIAGNOSIS — E876 Hypokalemia: Secondary | ICD-10-CM

## 2021-02-03 DIAGNOSIS — K5900 Constipation, unspecified: Secondary | ICD-10-CM

## 2021-02-03 DIAGNOSIS — Z794 Long term (current) use of insulin: Secondary | ICD-10-CM

## 2021-02-03 DIAGNOSIS — E1169 Type 2 diabetes mellitus with other specified complication: Secondary | ICD-10-CM

## 2021-02-03 DIAGNOSIS — I10 Essential (primary) hypertension: Secondary | ICD-10-CM

## 2021-02-03 LAB — CBC
HCT: 28.9 % — ABNORMAL LOW (ref 39.0–52.0)
Hemoglobin: 9.9 g/dL — ABNORMAL LOW (ref 13.0–17.0)
MCH: 29.5 pg (ref 26.0–34.0)
MCHC: 34.3 g/dL (ref 30.0–36.0)
MCV: 86 fL (ref 80.0–100.0)
Platelets: 152 10*3/uL (ref 150–400)
RBC: 3.36 MIL/uL — ABNORMAL LOW (ref 4.22–5.81)
RDW: 14.5 % (ref 11.5–15.5)
WBC: 6.2 10*3/uL (ref 4.0–10.5)
nRBC: 0 % (ref 0.0–0.2)

## 2021-02-03 LAB — BASIC METABOLIC PANEL
Anion gap: 8 (ref 5–15)
BUN: 20 mg/dL (ref 8–23)
CO2: 33 mmol/L — ABNORMAL HIGH (ref 22–32)
Calcium: 8.6 mg/dL — ABNORMAL LOW (ref 8.9–10.3)
Chloride: 96 mmol/L — ABNORMAL LOW (ref 98–111)
Creatinine, Ser: 0.9 mg/dL (ref 0.61–1.24)
GFR, Estimated: >60 mL/min (ref >60)
Glucose, Bld: 70 mg/dL (ref 70–99)
Potassium: 2.8 mmol/L — ABNORMAL LOW (ref 3.5–5.1)
Sodium: 137 mmol/L (ref 135–145)

## 2021-02-03 LAB — GLUCOSE, CAPILLARY
Glucose-Capillary: 171 mg/dL — ABNORMAL HIGH (ref 70–99)
Glucose-Capillary: 143 mg/dL — ABNORMAL HIGH (ref 70–99)

## 2021-02-03 MED ORDER — POTASSIUM CHLORIDE CRYS ER 20 MEQ PO TBCR
40.0000 meq | EXTENDED_RELEASE_TABLET | ORAL | Status: DC
  Administered 2021-02-03 (×2): 40 meq via ORAL
  Filled 2021-02-03 (×2): qty 2

## 2021-02-03 MED ORDER — MAGNESIUM SULFATE IN D5W 1-5 GM/100ML-% IV SOLN
1.0000 g | Freq: Once | INTRAVENOUS | Status: AC
  Administered 2021-02-03: 1 g via INTRAVENOUS
  Filled 2021-02-03: qty 100

## 2021-02-03 MED ORDER — POTASSIUM CHLORIDE CRYS ER 20 MEQ PO TBCR: 20.0000 meq | EXTENDED_RELEASE_TABLET | Freq: Every day | ORAL | 0 refills | Status: DC

## 2021-02-03 MED ORDER — TRAZODONE HCL 50 MG PO TABS
50.0000 mg | ORAL_TABLET | Freq: Every evening | ORAL | Status: DC | PRN
Start: 2021-02-03 — End: 2022-11-11

## 2021-02-03 MED ORDER — LINACLOTIDE 145 MCG PO CAPS: 145.0000 ug | ORAL_CAPSULE | Freq: Every day | ORAL | 1 refills | Status: DC

## 2021-02-03 MED ORDER — POLYETHYLENE GLYCOL 3350 17 G PO PACK: 17.0000 g | PACK | Freq: Two times a day (BID) | ORAL | 3 refills | Status: DC

## 2021-02-03 MED ORDER — SENNOSIDES-DOCUSATE SODIUM 8.6-50 MG PO TABS: 1.0000 | ORAL_TABLET | Freq: Every day | ORAL | 2 refills | Status: DC

## 2021-02-03 MED ORDER — LINACLOTIDE 145 MCG PO CAPS
145.0000 ug | ORAL_CAPSULE | Freq: Every day | ORAL | Status: DC
  Administered 2021-02-03: 145 ug via ORAL
  Filled 2021-02-03: qty 1

## 2021-02-03 NOTE — Discharge Summary (Signed)
Physician Discharge Summary  ALDWIN Espinoza UVO:[id number] DOB: 1941-07-22 DOA: 01/31/2021  PCP: [name], MD  Admit date: 01/31/2021 Discharge date: 02/03/2021  Time spent: 35 minutes  Recommendations for Outpatient Follow-up:  1. Repeat basic metabolic panel to follow lites and renal function 2. Reassess tolerance and response to bowel regimen. 3. Continue to follow CBGs with further adjustment to hypoglycemia regimen as required.   Discharge Diagnoses:  Active Problems:   Primary hypertension   Diabetes mellitus ([place])   Fecal impaction ([facility])   Constipation   Hypokalemia   Discharge Condition: Stable and improved.  Discharged home with instruction to follow-up with PCP in 10 days  CODE STATUS: Full code   Diet recommendation: Heart healthy and modify carbohydrate diet   History of present illness:  Keith Espinoza a 80 y.o.malewith medical history significant of right below-knee amputation , essential hypertension, type 2 diabetes,gout, BPH, coronary artery disease,peripheral vascular disease, presented to hospital with intermittent abdominal pain and constipation for 4 days.  Normally has a bowel movement every 3 days.  Patient has however remained mobile despite his right below-knee amputation.  Abdominal x-ray revealed stool burden without small bowel obstruction for which he was prescribed laxatives and shown home but did not improve so he decided to come back to the hospital.  In the ED CT scan of the abdomen and pelvis was performed which showed large amount of stool in the colon consistent with constipation.  Hospital Course:  Fecal impaction/chronic constipation -Patient reports significant improvement in his symptoms; no further episode of nausea or vomiting -Continue to have multiple good bowel movements overnight and is feeling ready to go home -Extensive discussion about adequate hydration and proper nutrition with continue bowel regimen to further prevent  in the future constipation and fecal impaction. -Patient has been discharged on MiraLAX, Senokot and Linzess -Close monitoring of patient electrolytes for further repletion recommended.  Type 2 diabetes with hyperglycemia -Most recent A1c 7.5 -Continue home hypoglycemic regimen -Continue outpatient follow-up with PCP to further adjust diabetic management as required -Advised to follow modified carbohydrate diet and to maintain adequate hydration.  History of coronary artery disease -No chest pain or shortness of breath during hospitalization -Continue the use of Plavix and imdur -Heart healthy diet has been encouraged.  Diabetes polyneuropathy -Continue gabapentin.  Gastroesophageal flux disease -Continue as needed acid reflux medication; no prescription appreciated on his home medication regimen.  Peripheral vascular disease status post right below the knee amputation -Continue supportive care and risk factor modification -Stump without appreciated wounds -Home health services to further assess with current condition and in place.  Hypokalemia -repleted -patient discharge on maintenance supplementation  Chronic diastolic heart failure -Appears to be stable and compensated -Patient advised to follow low-sodium diet and to watch his weight.   Procedures:  See below for x-ray reports.  Consultations:  None   Discharge Exam: Vitals:   02/02/21 1940 02/03/21 0437  BP: (!) 143/65 (!) 160/69  Pulse: 66 70  Resp: 16 17  Temp: 97.7 F (36.5 C) 98.1 F (36.7 C)  SpO2: 100% 99%    General: Afebrile, no chest pain, no nausea, no vomiting; reports multiple bowel movements and denies abdominal pain at this time.  Ready to go home Cardiovascular: S1 and S2; no rubs, no gallops, no JVD. Respiratory: Clear to auscultation bilaterally; no using accessory muscle.  Good saturation on room air Abdomen: Soft, nontender, distended, positive bowel sounds. Extremities: Right BKA;  no cyanosis or clubbing.  Discharge  Instructions   Discharge Instructions    (HEART FAILURE PATIENTS) Call MD:  Anytime you have any of the following symptoms: 1) 3 pound weight gain in 24 hours or 5 pounds in 1 week 2) shortness of breath, with or without a dry hacking cough 3) swelling in the hands, feet or stomach 4) if you have to sleep on extra pillows at night in order to breathe.   Complete by: As directed    Diet - low sodium heart healthy   Complete by: As directed    Discharge instructions   Complete by: As directed    Take medications as prescribed Maintain adequate hydration Follow heart healthy (low sodium diet) and modified carbohydrates  Arrange follow up with PCP in 10 days   Increase activity slowly   Complete by: As directed      Allergies as of 02/03/2021      Reactions   Simvastatin Other (See Comments)   SEVERE MYALGIAS   Zetia [ezetimibe] Other (See Comments)   MYALGIAS   Dilaudid [hydromorphone Hcl] Other (See Comments)   hallucination      Medication List    STOP taking these medications   doxycycline 100 MG tablet Commonly known as: VIBRA-TABS     TAKE these medications   acetaminophen 500 MG tablet Commonly known as: TYLENOL Take 1,000 mg by mouth every 6 (six) hours as needed (pain).   allopurinol 300 MG tablet Commonly known as: ZYLOPRIM Take 300 mg by mouth daily.   amLODipine 5 MG tablet Commonly known as: NORVASC Take 1 tablet (5 mg total) by mouth daily.   aspirin EC 81 MG tablet Take 81 mg by mouth daily.   B-12 2500 MCG Tabs Take 2,500 mcg by mouth daily.   clopidogrel 75 MG tablet Commonly known as: PLAVIX Take 1 tablet (75 mg total) by mouth daily.   empagliflozin 25 MG Tabs tablet Commonly known as: JARDIANCE Take 25 mg by mouth daily.   gabapentin 400 MG capsule Commonly known as: NEURONTIN Take 1 capsule (400 mg total) by mouth 3 (three) times daily. What changed: when to take this   gabapentin 100 MG  capsule Commonly known as: NEURONTIN Take 2 capsules (200 mg total) by mouth at bedtime. What changed: how much to take   hydrochlorothiazide 12.5 MG tablet Commonly known as: HYDRODIURIL Take 1 tablet (12.5 mg total) by mouth daily.   isosorbide mononitrate 30 MG 24 hr tablet Commonly known as: IMDUR Take 1 tablet (30 mg total) by mouth daily. Please schedule yearly appointment for future refills. Thank you   Levemir FlexTouch 100 UNIT/ML FlexPen Generic drug: insulin detemir Inject 15-20 Units into the skin at bedtime. Dose is based on CBG   linaclotide 145 MCG Caps capsule Commonly known as: LINZESS Take 1 capsule (145 mcg total) by mouth daily before breakfast. Start taking on: Feb 04, 2021   losartan 50 MG tablet Commonly known as: COZAAR Take 1 tablet (50 mg total) by mouth daily.   metFORMIN 1000 MG tablet Commonly known as: GLUCOPHAGE Take 1 tablet (1,000 mg total) by mouth 2 (two) times daily.   methocarbamol 500 MG tablet Commonly known as: ROBAXIN Take 1 tablet (500 mg total) by mouth every 6 (six) hours as needed for muscle spasms.   nitroGLYCERIN 0.4 MG SL tablet Commonly known as: NITROSTAT Place 0.4 mg under the tongue every 5 (five) minutes x 3 doses as needed for chest pain.   ONE TOUCH ULTRA TEST test strip Generic drug:  glucose blood CHECK BLOOD SUGAR ONCE DAILY AS DIRECTED   oxyCODONE 5 MG immediate release tablet Commonly known as: Oxy IR/ROXICODONE Take 1 tablet (5 mg total) by mouth every 4 (four) hours as needed for severe pain.   polyethylene glycol 17 g packet Commonly known as: MiraLax Take 17 g by mouth 2 (two) times daily. What changed: when to take this   potassium chloride SA 20 MEQ tablet Commonly known as: KLOR-CON Take 1 tablet (20 mEq total) by mouth daily.   senna-docusate 8.6-50 MG tablet Commonly known as: Senokot-S Take 1 tablet by mouth at bedtime. What changed:   when to take this  reasons to take this   terazosin  5 MG capsule Commonly known as: HYTRIN Take 1 capsule (5 mg total) by mouth at bedtime.   traMADol 50 MG tablet Commonly known as: ULTRAM Take 1 tablet (50 mg total) by mouth every 6 (six) hours as needed for moderate pain.   traZODone 50 MG tablet Commonly known as: DESYREL Take 1 tablet (50 mg total) by mouth at bedtime as needed for sleep.   Vitamin D3 50 MCG (2000 UT) Tabs Take 2,000 Units by mouth daily.      Allergies  Allergen Reactions  . Simvastatin Other (See Comments)    SEVERE MYALGIAS   . Zetia [Ezetimibe] Other (See Comments)    MYALGIAS  . Dilaudid [Hydromorphone Hcl] Other (See Comments)    hallucination    Follow-up Information    [facility] Follow up.   Contact information: [address] [place] [phone]       [name], MD. Schedule an appointment as soon as possible for a visit in 10 day(s).   Specialty: Internal Medicine Contact information: [address] [place] [address] [phone]        Jettie Booze, MD .   Specialties: Cardiology, Radiology, Interventional Cardiology Contact information: [address] [place] [address] [phone]                The results of significant diagnostics from this hospitalization (including imaging, microbiology, ancillary and laboratory) are listed below for reference.    Significant Diagnostic Studies: CT Abdomen Pelvis W Contrast  Result Date: 01/31/2021 CLINICAL DATA:  Constipation for 3 days, abdominal pain EXAM: CT ABDOMEN AND PELVIS WITH CONTRAST TECHNIQUE: Multidetector CT imaging of the abdomen and pelvis was performed using the standard protocol following bolus administration of intravenous contrast. CONTRAST:  68mL OMNIPAQUE IOHEXOL 300 MG/ML  SOLN COMPARISON:  01/30/2021 FINDINGS: Lower chest: No acute pleural or parenchymal lung disease. Hepatobiliary: No focal liver abnormality is  seen. Status post cholecystectomy. No biliary dilatation. Pancreas: Unremarkable. No pancreatic ductal dilatation or surrounding inflammatory changes. Spleen: Normal in size without focal abnormality. Adrenals/Urinary Tract: 10 mm indeterminate left adrenal nodule may reflect adenoma. Right adrenals unremarkable. There are punctate bilateral nonobstructing renal calculi, measuring up to 5 mm on the right and 3 mm on the left. No obstructive uropathy within either kidney. There is bilateral renal cortical thinning and scarring. The bladder is unremarkable. Stomach/Bowel: No bowel obstruction or ileus. There is a large amount of fecal retention throughout the colon. Significant stool within the rectal vault consistent with fecal impaction. No bowel wall thickening or inflammatory change. Vascular/Lymphatic: Aortic atherosclerosis. No enlarged abdominal or pelvic lymph nodes. Reproductive: Prostate is unremarkable. Other: There is no free fluid or free gas. No abdominal wall hernia. Musculoskeletal: Stable right  hip arthroplasty. No acute or destructive bony lesions. Reconstructed images demonstrate no additional findings. IMPRESSION: 1. Large amount of retained stool throughout the colon consistent with constipation. Large amount of stool within the rectal vault consistent with fecal impaction. 2. Punctate bilateral nonobstructing renal calculi. 3. Indeterminate 10 mm left adrenal nodule, likely adenoma. 4.  Aortic Atherosclerosis (ICD10-I70.0). Electronically Signed   By: Randa Ngo M.D.   On: 01/31/2021 22:51   DG Abdomen Acute W/Chest  Result Date: 01/30/2021 CLINICAL DATA:  Constipation EXAM: DG ABDOMEN ACUTE WITH 1 VIEW CHEST COMPARISON:  None. FINDINGS: Moderate amount of stool in the transverse and descending colon. No bowel dilatation to suggest obstruction. Large calcification projecting over the left kidney likely reflecting a staghorn calculus. Heart size and mediastinal contours are within normal  limits. Both lungs are clear. Right hip arthroplasty. IMPRESSION: 1. Moderate amount of stool in the transverse and descending colon. 2. Large calcification projecting over the left kidney likely reflecting a staghorn calculus. Electronically Signed   By: Kathreen Devoid   On: 01/30/2021 20:24    Microbiology: Recent Results (from the past 240 hour(s))  Resp Panel by RT-PCR (Flu A&B, Covid) Nasopharyngeal Swab     Status: None   Collection Time: 02/01/21  2:24 AM   Specimen: Nasopharyngeal Swab; Nasopharyngeal(NP) swabs in vial transport medium  Result Value Ref Range Status   SARS Coronavirus 2 by RT PCR NEGATIVE NEGATIVE Final    Comment: (NOTE) SARS-CoV-2 target nucleic acids are NOT DETECTED.  The SARS-CoV-2 RNA is generally detectable in upper respiratory specimens during the acute phase of infection. The lowest concentration of SARS-CoV-2 viral copies this assay can detect is 138 copies/mL. A negative result does not preclude SARS-Cov-2 infection and should not be used as the sole basis for treatment or other patient management decisions. A negative result may occur with  improper specimen collection/handling, submission of specimen other than nasopharyngeal swab, presence of viral mutation(s) within the areas targeted by this assay, and inadequate number of viral copies(<138 copies/mL). A negative result must be combined with clinical observations, patient history, and epidemiological information. The expected result is Negative.  Fact Sheet for Patients:  EntrepreneurPulse.com.au  Fact Sheet for Healthcare Providers:  IncredibleEmployment.be  This test is no t yet approved or cleared by the [place] FDA and  has been authorized for detection and/or diagnosis of SARS-CoV-2 by FDA under an Emergency Use Authorization (EUA). This EUA will remain  in effect (meaning this test can be used) for the duration of the COVID-19 declaration under  Section 564(b)(1) of the Act, 21 U.S.C.section 360bbb-3(b)(1), unless the authorization is terminated  or revoked sooner.       Influenza A by PCR NEGATIVE NEGATIVE Final   Influenza B by PCR NEGATIVE NEGATIVE Final    Comment: (NOTE) The Xpert Xpress SARS-CoV-2/FLU/RSV plus assay is intended as an aid in the diagnosis of influenza from Nasopharyngeal swab specimens and should not be used as a sole basis for treatment. Nasal washings and aspirates are unacceptable for Xpert Xpress SARS-CoV-2/FLU/RSV testing.  Fact Sheet for Patients: EntrepreneurPulse.com.au  Fact Sheet for Healthcare Providers: IncredibleEmployment.be  This test is not yet approved or cleared by the [place] FDA and has been authorized for detection and/or diagnosis of SARS-CoV-2 by FDA under an Emergency Use Authorization (EUA). This EUA will remain in effect (meaning this test can be used) for the duration of the COVID-19 declaration under Section 564(b)(1) of the Act, 21 U.S.C. section 360bbb-3(b)(1), unless the authorization  is terminated or revoked.  Performed at [facility], [place] [address], [place], [place] [address]      Labs: Basic Metabolic Panel: Recent Labs  Lab 01/31/21 1915 02/01/21 0625 02/03/21 0313  NA 135 138 137  K 4.5 3.9 2.8*  CL 95* 95* 96*  CO2 25 32 33*  GLUCOSE 398* 231* 70  BUN 22 23 20   CREATININE 1.23 1.11 0.90  CALCIUM 9.3 9.1 8.6*  MG  --  2.7*  --   PHOS  --  3.8  --    Liver Function Tests: Recent Labs  Lab 01/31/21 1915  AST 17  ALT 13  ALKPHOS 79  BILITOT 1.2  PROT 6.5  ALBUMIN 3.5   Recent Labs  Lab 01/31/21 1915  LIPASE 28   CBC: Recent Labs  Lab 01/31/21 1915 02/01/21 0625 02/03/21 0313  WBC 11.6* 9.3 6.2  HGB 11.2* 10.6* 9.9*  HCT 35.9* 32.1* 28.9*  MCV 92.5 89.4 86.0  PLT 187 171 152   CBG: Recent Labs  Lab 02/02/21 1147 02/02/21 1631 02/02/21 2052 02/03/21 0757 02/03/21 1147   GLUCAP 243* 240* 227* 171* 143*       Signed:  Barton Dubois MD.  [facility] 02/03/2021, 12:32 PM

## 2021-02-03 NOTE — Care Management Important Message (Signed)
Important Message  Patient Details  Name: Keith Espinoza MRN: [id number] Date of Birth: November 28, 1940   Medicare Important Message Given:  Yes     Orbie Pyo 02/03/2021, 12:39 PM

## 2021-02-03 NOTE — Progress Notes (Signed)
Pt repeatedly asking to see the doctor this morning. Says that he wants to go home as he feels better. Current potassium level 2.8, replacement orders given. Explained need for electrolyte replacement but pt seems anxious to see the MD. Md notified and will see when rounding up

## 2021-02-03 NOTE — Progress Notes (Signed)
PT Cancellation Note  Patient Details Name: Keith Espinoza MRN: [id number] DOB: 1941/07/12   Cancelled Treatment:    Reason Eval/Treat Not Completed: Other (comment).  Pt is declining therapy today, no reason given.  States he will not be up walking at home, and waiting for discharge.  Asking to have IV removed, contacted nurse.   Ramond Dial 02/03/2021, 12:42 PM   Mee Hives, PT MS Acute Rehab Dept. Number: [place] [facility] [place]

## 2021-02-03 NOTE — Progress Notes (Signed)
Pt discharged home in stable condition 

## 2021-02-03 NOTE — Consult Note (Signed)
   [facility] Inpatient Consult   02/03/2021  Keith Espinoza September 26, 1941 [id number]  [facility] [ACO] Patient: Medicare CMS DCE   Patient is currently active with [facility] for chronic disease management services.  Patient has been engaged by a [facility].  Our community based plan of care has focused on disease management and community resource support.    Patient assessed for less than 30 days readmission.  Plan: Will have [facility] RN actively following for care management. Patient already transitioned home.   Of note, [facility] Care Management services does not replace or interfere with any services that are needed or arranged by inpatient [facility] care management team.  For additional questions or referrals please contact:  Natividad Brood, RN BSN [facility] Liaison  [phone] business mobile phone Toll free office [phone]  Fax number: [phone] [email or web address]

## 2021-02-04 DIAGNOSIS — K5909 Other constipation: Secondary | ICD-10-CM | POA: Diagnosis not present

## 2021-02-04 DIAGNOSIS — I2510 Atherosclerotic heart disease of native coronary artery without angina pectoris: Secondary | ICD-10-CM | POA: Diagnosis not present

## 2021-02-04 DIAGNOSIS — E1140 Type 2 diabetes mellitus with diabetic neuropathy, unspecified: Secondary | ICD-10-CM | POA: Diagnosis not present

## 2021-02-04 DIAGNOSIS — E1151 Type 2 diabetes mellitus with diabetic peripheral angiopathy without gangrene: Secondary | ICD-10-CM | POA: Diagnosis not present

## 2021-02-04 DIAGNOSIS — Z4781 Encounter for orthopedic aftercare following surgical amputation: Secondary | ICD-10-CM | POA: Diagnosis not present

## 2021-02-04 DIAGNOSIS — S81001D Unspecified open wound, right knee, subsequent encounter: Secondary | ICD-10-CM | POA: Diagnosis not present

## 2021-02-04 DIAGNOSIS — I10 Essential (primary) hypertension: Secondary | ICD-10-CM | POA: Diagnosis not present

## 2021-02-04 DIAGNOSIS — G8929 Other chronic pain: Secondary | ICD-10-CM | POA: Diagnosis not present

## 2021-02-04 DIAGNOSIS — Z89511 Acquired absence of right leg below knee: Secondary | ICD-10-CM | POA: Diagnosis not present

## 2021-02-04 DIAGNOSIS — M545 Low back pain, unspecified: Secondary | ICD-10-CM | POA: Diagnosis not present

## 2021-02-04 DIAGNOSIS — E1165 Type 2 diabetes mellitus with hyperglycemia: Secondary | ICD-10-CM | POA: Diagnosis not present

## 2021-02-05 ENCOUNTER — Encounter: Payer: Self-pay | Admitting: Orthopedic Surgery

## 2021-02-05 ENCOUNTER — Ambulatory Visit (INDEPENDENT_AMBULATORY_CARE_PROVIDER_SITE_OTHER): Payer: Medicare Other | Admitting: Physician Assistant

## 2021-02-05 ENCOUNTER — Other Ambulatory Visit: Payer: Self-pay | Admitting: *Deleted

## 2021-02-05 VITALS — Ht 70.0 in | Wt 142.0 lb

## 2021-02-05 DIAGNOSIS — Z89511 Acquired absence of right leg below knee: Secondary | ICD-10-CM

## 2021-02-05 NOTE — Progress Notes (Signed)
Office Visit Note   Patient: Keith Espinoza           Date of Birth: 1941-07-15           MRN: [id number] Visit Date: 02/05/2021              Requested by: [name], MD [address] [place],  [place] [address] PCP: [name], MD  Chief Complaint  Patient presents with  . Right Leg - Follow-up    Right below knee amputation 01/09/2021      HPI: Patient is a pleasant 80 year old gentleman who is 4 weeks status post right below-knee amputation.  He is accompanied by his daughter.  Assessment & Plan: Visit Diagnoses: No diagnosis found.  Plan: Remaining sutures were harvested as well as a small dissolvable suture piece that was in the central portion of the wound.  Prescription for prosthetic will be provided at next visit  Follow-Up Instructions: No follow-ups on file.   Ortho Exam  Patient is alert, oriented, no adenopathy, well-dressed, normal affect, normal respiratory effort. Examination pressure area from limb protector continues to improve it is scabbed over there is no drainage no surrounding or ascending cellulitis.  Amputation stump well apposed wound edges there is 1 very saw small area of superficial dehiscence that is at skin surface level.  Does not probe deeply.  Again no foul odor no ascending cellulitis  Imaging: No results found. No images are attached to the encounter.  Labs: Lab Results  Component Value Date   HGBA1C 7.5 (H) 02/01/2021   HGBA1C 7.7 (H) 12/12/2020   HGBA1C 6.6 (H) 04/10/2019   REPTSTATUS 04/18/2019 FINAL 04/13/2019   GRAMSTAIN  04/13/2019    RARE WBC PRESENT, PREDOMINANTLY PMN NO ORGANISMS SEEN    CULT  04/13/2019    No growth aerobically or anaerobically. Performed at [facility], [place] [address] [place], [place] [address]      Lab Results  Component Value Date   ALBUMIN 3.5 01/31/2021   ALBUMIN 2.9 (L) 12/17/2020   ALBUMIN 4.4 04/10/2019    Lab Results  Component Value Date   MG 2.7 (H)  02/01/2021   No results found for: VD25OH  No results found for: PREALBUMIN CBC EXTENDED Latest Ref Rng & Units 02/03/2021 02/01/2021 01/31/2021  WBC 4.0 - 10.5 K/uL 6.2 9.3 11.6(H)  RBC 4.22 - 5.81 MIL/uL 3.36(L) 3.59(L) 3.88(L)  HGB 13.0 - 17.0 g/dL 9.9(L) 10.6(L) 11.2(L)  HCT 39.0 - 52.0 % 28.9(L) 32.1(L) 35.9(L)  PLT 150 - 400 K/uL 152 171 187  NEUTROABS 1.7 - 7.7 K/uL - - -  LYMPHSABS 0.7 - 4.0 K/uL - - -     Body mass index is 20.37 kg/m.  Orders:  No orders of the defined types were placed in this encounter.  No orders of the defined types were placed in this encounter.    Procedures: No procedures performed  Clinical Data: No additional findings.  ROS:  All other systems negative, except as noted in the HPI. Review of Systems  Objective: Vital Signs: Ht 5\' 10"  (1.778 m)   Wt 142 lb (64.4 kg)   BMI 20.37 kg/m   Specialty Comments:  No specialty comments available.  PMFS History: Patient Active Problem List   Diagnosis Date Noted  . Hypokalemia   . Constipation 02/02/2021  . Fecal impaction ([place]) 02/01/2021  . Wound dehiscence 01/09/2021  . Dehiscence of amputation stump ([place])   . Status post percutaneous transluminal  coronary angioplasty 01/06/2021  . Type II diabetes mellitus, uncontrolled ([place]) 01/06/2021  . Acute pancreatitis 01/06/2021  . Diabetic peripheral vascular disease ([place]) 01/06/2021  . Encounter for screening for other disorder 01/06/2021  . Enlarged prostate 01/06/2021  . Foot ulcer, right ([place]) 01/06/2021  . Gout 01/06/2021  . Headache 01/06/2021  . Neck pain 01/06/2021  . Hypoglycemia 01/06/2021  . Loss of appetite 01/06/2021  . 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 Peripheral neuropathy 01/06/2021  . Sciatica 01/06/2021  . Vitamin B12 deficiency 01/06/2021  . Vitamin D deficiency 01/06/2021  . Weakness 01/06/2021  . Diabetes mellitus type 2 with neurological manifestations ([place]) 01/06/2021  . Protein-calorie  malnutrition, severe 12/26/2020  . Acute blood loss anemia 12/26/2020  . Prerenal azotemia 12/26/2020  . Below-knee amputation of right lower extremity ([place]) 12/12/2020  . Gangrene of right foot ([place])   . Diabetic neuropathy ([place]) 11/17/2020  . Hyperglycemia due to type 2 diabetes mellitus ([place]) 11/17/2020  . Long term (current) use of insulin ([place]) 11/17/2020  . Obesity 11/17/2020  . 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/2019  . Hip dislocation, right ([place]) 04/13/2019  . Other intervertebral disc degeneration, lumbar region 03/30/2019  . CAD (coronary artery disease) 01/30/2019  . Tobacco abuse 01/30/2019  . 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  . Burn, foot, second degree, left, initial encounter 06/08/2017  . 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 S/P total knee arthroplasty, left 10/26/2016  . Hyperlipidemia 09/04/2014  . Thrombocytopenia ([place])   . Precordial chest pain 04/05/2014  . Coronary atherosclerosis of native coronary artery 10/01/2013  . Other and unspecified hyperlipidemia 10/01/2013  . Primary hypertension 10/01/2013  . Diabetes mellitus ([place]) 10/01/2013  . Esophageal reflux 10/01/2013  . Hypertrophy of prostate without urinary obstruction and other lower urinary tract symptoms (LUTS) 10/01/2013   Past Medical History:  Diagnosis Date  . Allergic rhinitis   . Allergic rhinitis   . Arthritis   . Basal cell carcinoma 11/01/2019    bcc left chest treatment [place] cx3 25fu   . Chronic leg pain    right  . Chronic lower back pain   . Coronary artery disease    a. Stenting to RCA 2004; staged DES to LAD and Cx 2004. DES to mRCA 2012. b. DES to mCx, PTCA to dCx 11/2011. c. Lateral wall MI 2013 s/p PTCA to distal Cx & DES to mid OM2 11/2011. d. Low risk nuc 04/2014, EF wnl.  Marland Kitchen COVID-19   . Diabetes mellitus    Insulin dependent  . Diabetic neuropathy (HCC)    MILD  . Diverticulosis   . Dysrhythmia   . Gilbert syndrome   . Gout    right  wrist; right foot; right elbow; have had it since 1970's  . H/O hiatal hernia   . Heart murmur   . History of echocardiogram    aortic sclerosis per echo 12/09 EF 65%, otherwise normal  . History of hemorrhoids    BLEEDING  . History of kidney stones    h/o  . Hypertension    Diagnosed 1995   . Myocardial infarction ([place])   . Pancreatic pseudocyst    a. s/p remote drainage 2006.  Marland Kitchen Thrombocytopenia ([place])    Seen on oldest labs in system from 2004  . Vitamin B 12 deficiency    orally replaced    Family History  Problem Relation Age of Onset  . Diabetes Mother   . Hyperlipidemia Mother   . Hypertension  Mother   . Cancer Father   . Hypertension Father   . Diabetes Sister   . Hypertension Sister   . Cancer Brother   . Heart attack Neg Hx     Past Surgical History:  Procedure Laterality Date  . ABDOMINAL AORTOGRAM W/LOWER EXTREMITY Bilateral 08/08/2020   Procedure: ABDOMINAL AORTOGRAM W/LOWER EXTREMITY;  Surgeon: Angelia Mould, MD;  Location: [facility];  Service: Cardiovascular;  Laterality: Bilateral;  . AMPUTATION Right 12/12/2020   Procedure: RIGHT BELOW KNEE AMPUTATION;  Surgeon: [name], MD;  Location: [facility];  Service: Orthopedics;  Laterality: Right;  . BACK SURGERY     "total of 3 times" S/P fall   . CARPAL TUNNEL RELEASE Bilateral   . CHOLECYSTECTOMY  1990's  . COLONOSCOPY    . CORONARY ANGIOPLASTY  11/11/11  . CORONARY ANGIOPLASTY WITH STENT PLACEMENT  09/30/2011   "1 then; makes a total of 4"  . CORONARY ANGIOPLASTY WITH STENT PLACEMENT  11/11/11   "1; makes a total of 5"  . INGUINAL HERNIA REPAIR  2003   right  . JOINT REPLACEMENT Right 04/03/2002   hip replacment  . KNEE ARTHROSCOPY  1990's   left  . LEFT HEART CATHETERIZATION WITH CORONARY ANGIOGRAM N/A 09/30/2011   Procedure: LEFT HEART CATHETERIZATION WITH CORONARY ANGIOGRAM;  Surgeon: Jettie Booze, MD;  Location: [facility] CATH LAB;  Service: Cardiovascular;  Laterality: N/A;  possible  PCI  . LEFT HEART CATHETERIZATION WITH CORONARY ANGIOGRAM N/A 11/15/2011   Procedure: LEFT HEART CATHETERIZATION WITH CORONARY ANGIOGRAM;  Surgeon: Jettie Booze, MD;  Location[facility] C[facility] LAB;  Service: Cardiovascular;  Laterality: N/A;  . PERCUTANEOUS CORONARY STENT INTERVENTION (PCI-S)  09/30/2011   Procedure: PERCUTANEOUS CORONARY STENT INTERVENTION (PCI-S);  Surgeon: Jettie Booze, MD;  Location: [facility] C[facility] LAB;  Service: Cardiovascular;;  . PERCUTANEOUS CORONARY STENT INTERVENTION (PCI-S) N/A 11/11/2011   Procedure: PERCUTANEOUS CORONARY STENT INTERVENTION (PCI-S);  Surgeon: Jettie Booze, MD;  Location: [facility] CATH LAB;  Service: Cardiovascular;  Laterality: N/A;  . PERIPHERAL VASCULAR BALLOON ANGIOPLASTY Right 08/08/2020   Procedure: PERIPHERAL VASCULAR BALLOON ANGIOPLASTY;  Surgeon: Angelia Mould, MD;  Location: [facility];  Service: Cardiovascular;  Laterality: Right;  Posterior tibial   . SHOULDER SURGERY Right    X 2  . STUMP REVISION Right 01/09/2021   Procedure: REVISION RIGHT BELOW KNEE AMPUTATION;  Surgeon: [name], MD;  Location: [facility];  Service: Orthopedics;  Laterality: Right;  . TONSILLECTOMY  ~ 1948  . TOTAL HIP REVISION Right 04/13/2019   Procedure: RIGHT TOTAL HIP REVISION-POSTERIOR  APPROACH LATERAL;  Surgeon: Marybelle Killings, MD;  Location: [place] [place];  Service: Orthopedics;  Laterality: Right;  . TOTAL KNEE ARTHROPLASTY Left 07/23/2016   Procedure: LEFT TOTAL KNEE ARTHROPLASTY;  Surgeon: Marybelle Killings, MD;  Location: [facility];  Service: Orthopedics;  Laterality: Left;   Social History   Occupational History  . Occupation: Retired  Tobacco Use  . Smoking status: Former Research scientist (life sciences)  . Smokeless tobacco: Current User    Types: Chew  . Tobacco comment: quit 60 years ago  Vaping Use  . Vaping Use: Never used  Substance and Sexual Activity  . Alcohol use: No  . Drug use: No  . Sexual activity: Not Currently

## 2021-02-05 NOTE — Patient Outreach (Signed)
[facility]) Care Management  02/05/2021  [name] 01/18/1941 [id number]   Noted that member discharged from hospital on 5/3, readmitted with constipation/fecal impaction.  Call placed to member, no answer, HIPAA compliant voice message left.  Will follow up within the next 3-4 business days.  Valente David, [place], MSN [place] [phone]

## 2021-02-06 ENCOUNTER — Ambulatory Visit: Payer: Self-pay | Admitting: *Deleted

## 2021-02-09 DIAGNOSIS — E1151 Type 2 diabetes mellitus with diabetic peripheral angiopathy without gangrene: Secondary | ICD-10-CM | POA: Diagnosis not present

## 2021-02-09 DIAGNOSIS — S88119A Complete traumatic amputation at level between knee and ankle, unspecified lower leg, initial encounter: Secondary | ICD-10-CM | POA: Diagnosis not present

## 2021-02-09 DIAGNOSIS — E1140 Type 2 diabetes mellitus with diabetic neuropathy, unspecified: Secondary | ICD-10-CM | POA: Diagnosis not present

## 2021-02-09 DIAGNOSIS — K5900 Constipation, unspecified: Secondary | ICD-10-CM | POA: Diagnosis not present

## 2021-02-09 DIAGNOSIS — N401 Enlarged prostate with lower urinary tract symptoms: Secondary | ICD-10-CM | POA: Diagnosis not present

## 2021-02-10 ENCOUNTER — Other Ambulatory Visit: Payer: Self-pay | Admitting: *Deleted

## 2021-02-10 NOTE — Patient Outreach (Addendum)
[facility]) Care Management  02/10/2021  Keith Espinoza Nov 04, 1940 [id number]   Outreach attempt #2, successful.  Member report he is feeling better since this recent discharge, but was not able to get much sleep last night.  He will try Tylenol PM tonight if having trouble.  No longer having trouble with constipation, using Miralax and Sennakot daily, have Linzess for PRN use.  Report he is able to do more for himself while in the wheelchair, daughter remain active with assistance as needed.  Denies any urgent concerns, encouraged to contact this care manager with questions.  Agrees to follow up within the next 2 weeks.   Goals Addressed            This Visit's Progress   . ( THN)Monitor and Manage My Blood Sugar   On track    Timeframe:  Long-Range Goal Priority:  Medium Start Date:  29924268                        Expected End Date:  [id number]                    Follow Up Date [id number]   - check blood sugar at prescribed times - check blood sugar if I feel it is too high or too low - enter blood sugar readings and medication or insulin into daily log - take the blood sugar log to all doctor visits - take the blood sugar meter to all doctor visits    Why is this important?   Checking your blood sugar at home helps to keep it from getting very high or very low.  Writing the results in a diary or log helps the doctor know how to care for you.  Your blood sugar log should have the time, date and the results.  Also, write down the amount of insulin or other medicine that you take.  Other information, like what you ate, exercise done and how you were feeling, will also be helpful.     Notes:   4/19 - Encouraged to check blood sugars daily  5/10 - Reminded of importance of managing daily blood sugars are with wound healing.  Unable to report blood sugar reading for today.     Marland Kitchen [facility]) Make and Keep All Appointments   On track    Timeframe:  Long-Range  Goal Priority:  Medium Start Date:   [id number]                          Expected End Date:   [id number]                   Follow Up Date [id number]   - call to cancel if needed - keep a calendar with prescription refill dates - keep a calendar with appointment dates    Why is this important?   Part of staying healthy is seeing the doctor for follow-up care.  If you forget your appointments, there are some things you can do to stay on track.    Notes:  Need to schedule exam  4/19 - Recently discharged from readmission - Reviewed follow up scheduled with member, had visit with ortho on yesterday, repeat next week  5/10 - Follow up with PCP completed yesterday.  Cardiology, vascular, and ortho appointments all scheduled for 5/18    . (THN)Perform Foot Care   On track  Timeframe:  Long-Range Goal Priority:  Medium Start Date:  [id number]                           Expected End Date:     16384665                 Follow Up Date [id number]   - check feet daily for cuts, sores or redness - keep feet up while sitting - trim toenails straight across - wash and dry feet carefully every day - wear comfortable, cotton socks - wear comfortable, well-fitting shoes    Why is this important?   Good foot care is very important when you have diabetes.  There are many things you can do to keep your feet healthy and catch a problem early.    Notes:  Patient has ulcers on toes. Great toe has lost its toenail and is now draining [id number] foot is healing some  4/19 - Patient had right BKA, admitted for revision, discussed stump care and follow up with MD  5/10 - Member report staples removed, now has a few stitches with minimal drainage.  Plan is to fit for prosthetic once wound is fully healed.  Remains active with home health for nursing and PT/OT    . (THN)THN Set My Target A1C   On track    Timeframe:  Long-Range Goal Priority:  High Start Date:  [id number]                           Expected End  Date:    22633354                  Follow Up Date 56256389   - set target A1C    Why is this important?   Your target A1C is decided together by you and your doctor.  It is based on several things like your age and other health issues.    Notes: 7.0  4/19 - Patient aware of proper diet to manage A1C as well as adherence to medication regime  5/10 - Education sent on managing diabetes and decreasing A1C      Valente David, RN, MSN [place] Manager [phone]

## 2021-02-11 DIAGNOSIS — E1151 Type 2 diabetes mellitus with diabetic peripheral angiopathy without gangrene: Secondary | ICD-10-CM | POA: Diagnosis not present

## 2021-02-11 DIAGNOSIS — Z89511 Acquired absence of right leg below knee: Secondary | ICD-10-CM | POA: Diagnosis not present

## 2021-02-11 DIAGNOSIS — S81001D Unspecified open wound, right knee, subsequent encounter: Secondary | ICD-10-CM | POA: Diagnosis not present

## 2021-02-11 DIAGNOSIS — E1140 Type 2 diabetes mellitus with diabetic neuropathy, unspecified: Secondary | ICD-10-CM | POA: Diagnosis not present

## 2021-02-11 DIAGNOSIS — K5909 Other constipation: Secondary | ICD-10-CM | POA: Diagnosis not present

## 2021-02-11 DIAGNOSIS — G8929 Other chronic pain: Secondary | ICD-10-CM | POA: Diagnosis not present

## 2021-02-11 DIAGNOSIS — I10 Essential (primary) hypertension: Secondary | ICD-10-CM | POA: Diagnosis not present

## 2021-02-11 DIAGNOSIS — Z4781 Encounter for orthopedic aftercare following surgical amputation: Secondary | ICD-10-CM | POA: Diagnosis not present

## 2021-02-11 DIAGNOSIS — E1165 Type 2 diabetes mellitus with hyperglycemia: Secondary | ICD-10-CM | POA: Diagnosis not present

## 2021-02-11 DIAGNOSIS — I2510 Atherosclerotic heart disease of native coronary artery without angina pectoris: Secondary | ICD-10-CM | POA: Diagnosis not present

## 2021-02-11 DIAGNOSIS — M545 Low back pain, unspecified: Secondary | ICD-10-CM | POA: Diagnosis not present

## 2021-02-13 DIAGNOSIS — F341 Dysthymic disorder: Secondary | ICD-10-CM | POA: Diagnosis not present

## 2021-02-13 DIAGNOSIS — N400 Enlarged prostate without lower urinary tract symptoms: Secondary | ICD-10-CM | POA: Diagnosis not present

## 2021-02-13 DIAGNOSIS — S81001D Unspecified open wound, right knee, subsequent encounter: Secondary | ICD-10-CM | POA: Diagnosis not present

## 2021-02-13 DIAGNOSIS — E538 Deficiency of other specified B group vitamins: Secondary | ICD-10-CM | POA: Diagnosis not present

## 2021-02-13 DIAGNOSIS — M1990 Unspecified osteoarthritis, unspecified site: Secondary | ICD-10-CM | POA: Diagnosis not present

## 2021-02-13 DIAGNOSIS — Z89511 Acquired absence of right leg below knee: Secondary | ICD-10-CM | POA: Diagnosis not present

## 2021-02-13 DIAGNOSIS — K219 Gastro-esophageal reflux disease without esophagitis: Secondary | ICD-10-CM | POA: Diagnosis not present

## 2021-02-13 DIAGNOSIS — E1165 Type 2 diabetes mellitus with hyperglycemia: Secondary | ICD-10-CM | POA: Diagnosis not present

## 2021-02-13 DIAGNOSIS — K5790 Diverticulosis of intestine, part unspecified, without perforation or abscess without bleeding: Secondary | ICD-10-CM | POA: Diagnosis not present

## 2021-02-13 DIAGNOSIS — I2510 Atherosclerotic heart disease of native coronary artery without angina pectoris: Secondary | ICD-10-CM | POA: Diagnosis not present

## 2021-02-13 DIAGNOSIS — K5909 Other constipation: Secondary | ICD-10-CM | POA: Diagnosis not present

## 2021-02-13 DIAGNOSIS — M109 Gout, unspecified: Secondary | ICD-10-CM | POA: Diagnosis not present

## 2021-02-13 DIAGNOSIS — J309 Allergic rhinitis, unspecified: Secondary | ICD-10-CM | POA: Diagnosis not present

## 2021-02-13 DIAGNOSIS — I5032 Chronic diastolic (congestive) heart failure: Secondary | ICD-10-CM | POA: Diagnosis not present

## 2021-02-13 DIAGNOSIS — I252 Old myocardial infarction: Secondary | ICD-10-CM | POA: Diagnosis not present

## 2021-02-13 DIAGNOSIS — I110 Hypertensive heart disease with heart failure: Secondary | ICD-10-CM | POA: Diagnosis not present

## 2021-02-13 DIAGNOSIS — E876 Hypokalemia: Secondary | ICD-10-CM | POA: Diagnosis not present

## 2021-02-13 DIAGNOSIS — E1151 Type 2 diabetes mellitus with diabetic peripheral angiopathy without gangrene: Secondary | ICD-10-CM | POA: Diagnosis not present

## 2021-02-13 DIAGNOSIS — E1142 Type 2 diabetes mellitus with diabetic polyneuropathy: Secondary | ICD-10-CM | POA: Diagnosis not present

## 2021-02-13 DIAGNOSIS — Z4781 Encounter for orthopedic aftercare following surgical amputation: Secondary | ICD-10-CM | POA: Diagnosis not present

## 2021-02-13 DIAGNOSIS — C44509 Unspecified malignant neoplasm of skin of other part of trunk: Secondary | ICD-10-CM | POA: Diagnosis not present

## 2021-02-13 DIAGNOSIS — G8929 Other chronic pain: Secondary | ICD-10-CM | POA: Diagnosis not present

## 2021-02-13 DIAGNOSIS — M79661 Pain in right lower leg: Secondary | ICD-10-CM | POA: Diagnosis not present

## 2021-02-13 DIAGNOSIS — M545 Low back pain, unspecified: Secondary | ICD-10-CM | POA: Diagnosis not present

## 2021-02-16 ENCOUNTER — Ambulatory Visit: Payer: Medicare Other | Admitting: Interventional Cardiology

## 2021-02-17 DIAGNOSIS — S81001D Unspecified open wound, right knee, subsequent encounter: Secondary | ICD-10-CM | POA: Diagnosis not present

## 2021-02-17 DIAGNOSIS — Z4781 Encounter for orthopedic aftercare following surgical amputation: Secondary | ICD-10-CM | POA: Diagnosis not present

## 2021-02-17 DIAGNOSIS — K5909 Other constipation: Secondary | ICD-10-CM | POA: Diagnosis not present

## 2021-02-17 DIAGNOSIS — E1151 Type 2 diabetes mellitus with diabetic peripheral angiopathy without gangrene: Secondary | ICD-10-CM | POA: Diagnosis not present

## 2021-02-17 DIAGNOSIS — E1165 Type 2 diabetes mellitus with hyperglycemia: Secondary | ICD-10-CM | POA: Diagnosis not present

## 2021-02-17 DIAGNOSIS — Z89511 Acquired absence of right leg below knee: Secondary | ICD-10-CM | POA: Diagnosis not present

## 2021-02-18 ENCOUNTER — Ambulatory Visit (HOSPITAL_COMMUNITY)
Admission: RE | Admit: 2021-02-18 | Discharge: 2021-02-18 | Disposition: A | Discharge status: Home / Self Care | Payer: Medicare Other | Source: Ambulatory Visit | Attending: Vascular Surgery | Admitting: Vascular Surgery

## 2021-02-18 ENCOUNTER — Encounter: Payer: Self-pay | Admitting: Interventional Cardiology

## 2021-02-18 ENCOUNTER — Other Ambulatory Visit: Payer: Self-pay

## 2021-02-18 ENCOUNTER — Encounter: Payer: Self-pay | Admitting: Physician Assistant

## 2021-02-18 ENCOUNTER — Ambulatory Visit (INDEPENDENT_AMBULATORY_CARE_PROVIDER_SITE_OTHER): Payer: Medicare Other | Admitting: Physician Assistant

## 2021-02-18 ENCOUNTER — Encounter: Payer: Self-pay | Admitting: Vascular Surgery

## 2021-02-18 ENCOUNTER — Ambulatory Visit (INDEPENDENT_AMBULATORY_CARE_PROVIDER_SITE_OTHER): Payer: Medicare Other | Admitting: Interventional Cardiology

## 2021-02-18 ENCOUNTER — Ambulatory Visit (INDEPENDENT_AMBULATORY_CARE_PROVIDER_SITE_OTHER): Payer: Medicare Other | Admitting: Vascular Surgery

## 2021-02-18 VITALS — BP 107/66 | HR 105 | Temp 97.8°F | Resp 20 | Ht 70.0 in | Wt 142.0 lb

## 2021-02-18 VITALS — BP 110/52 | HR 97 | Ht 70.0 in | Wt 142.2 lb

## 2021-02-18 DIAGNOSIS — I25118 Atherosclerotic heart disease of native coronary artery with other forms of angina pectoris: Secondary | ICD-10-CM

## 2021-02-18 DIAGNOSIS — I10 Essential (primary) hypertension: Secondary | ICD-10-CM

## 2021-02-18 DIAGNOSIS — L97909 Non-pressure chronic ulcer of unspecified part of unspecified lower leg with unspecified severity: Secondary | ICD-10-CM

## 2021-02-18 DIAGNOSIS — I70299 Other atherosclerosis of native arteries of extremities, unspecified extremity: Secondary | ICD-10-CM

## 2021-02-18 DIAGNOSIS — E782 Mixed hyperlipidemia: Secondary | ICD-10-CM | POA: Diagnosis not present

## 2021-02-18 DIAGNOSIS — Z89511 Acquired absence of right leg below knee: Secondary | ICD-10-CM

## 2021-02-18 NOTE — Patient Instructions (Signed)
Medication Instructions:  Your physician recommends that you continue on your current medications as directed. Please refer to the Current Medication list given to you today.  *If you need a refill on your cardiac medications before your next appointment, please call your pharmacy*   Lab Work: None If you have labs (blood work) drawn today and your tests are completely normal, you will receive your results only by: Marland Kitchen MyChart Message (if you have MyChart) OR . A paper copy in the mail If you have any lab test that is abnormal or we need to change your treatment, we will call you to review the results.   Testing/Procedures: None   Follow-Up: At [facility], you and your health needs are our priority.  As part of our continuing mission to provide you with exceptional heart care, we have created designated Provider Care Teams.  These Care Teams include your primary Cardiologist (physician) and Advanced Practice Providers (APPs -  Physician Assistants and Nurse Practitioners) who all work together to provide you with the care you need, when you need it.  We recommend signing up for the patient portal called "MyChart".  Sign up information is provided on this After Visit Summary.  MyChart is used to connect with patients for Virtual Visits (Telemedicine).  Patients are able to view lab/test results, encounter notes, upcoming appointments, etc.  Non-urgent messages can be sent to your provider as well.   To learn more about what you can do with MyChart, go to [email or web address].    Your next appointment:   1 year(s)  The format for your next appointment:   In Person  Provider:   You may see Larae Grooms, MD or one of the following Advanced Practice Providers on your designated Care Team:    Melina Copa, PA-C  Ermalinda Barrios, PA-C    Other Instructions

## 2021-02-18 NOTE — Progress Notes (Signed)
Office Visit Note   Patient: Keith Espinoza           Date of Birth: 02/24/1941           MRN: [id number] Visit Date: 02/18/2021              Requested by: [name], MD [address] [place],  [place] [address] PCP: [name], MD  Chief Complaint  Patient presents with  . Right Leg - Routine Post Op    01/09/21 revision right BKA       HPI: Patient presents today 6 weeks status post revision right below-knee amputation.  He is overall doing okay.  He is wearing a shrinker.  He does say he is struggling for good diabetic control and his sugars have been in the 300s.  He has been referred to an endocrinologist  Assessment & Plan: Visit Diagnoses: No diagnosis found.  Plan: Continue to emphasize protein.  We will give them a prescription to start working with [employer].  We will follow-up in 3 weeks sooner if any concerns  Follow-Up Instructions: No follow-ups on file.   Ortho Exam  Patient is alert, oriented, no adenopathy, well-dressed, normal affect, normal respiratory effort. Below-knee amputation stump he does have eschar over the anterior tibial tubercle.  This is less erythematous than at his last visit there is no drainage scabbed over no signs of ascending cellulitis.  The stump itself overall well apposed wound edges and is healed he does have some scabbing.  There is 1 area that had a retained suture that has some millimeter of depth to it but does not probe deeply just serous drainage nontender to palpation no ascending cellulitis Patient is a new right transtibial  amputee.  Patient's current comorbidities are not expected to impact the ability to function with the prescribed prosthesis. Patient verbally communicates a strong desire to use a prosthesis. Patient currently requires mobility aids to ambulate without a prosthesis.  Expects not to use mobility aids with a new prosthesis.  Patient is a K2 level ambulator that will use a prosthesis to walk  around their home and the community over low level environmental barriers.     Imaging: VAS [place] ABI WITH/WO TBI  Result Date: 02/18/2021  LOWER EXTREMITY DOPPLER STUDY Patient Name:  Keith Espinoza  Date of Exam:   02/18/2021 Medical Rec #: [id number]       Accession #:    [id number] Date of Birth: 12/15/1940       Patient Gender: M Patient Age:   79Y Exam Location:  [facility] Procedure:      VAS [place] ABI WITH/WO TBI Referring Phys: [place] --------------------------------------------------------------------------------  Indications: Peripheral artery disease. High Risk Factors: Hypertension, Diabetes, coronary artery disease.  Vascular Interventions: 12/12/2020: Right below knee amputation. Comparison Study: 11/17/2020: Rt ABI 0.50 / bandage; Lt ABI unreliable / 0.50.                   Bilateral ABIs are unreliable. Performing Technologist: Ivan Croft  Examination Guidelines: A complete evaluation includes at minimum, Doppler waveform signals and systolic blood pressure reading at the level of bilateral brachial, anterior tibial, and posterior tibial arteries, when vessel segments are accessible. Bilateral testing is considered an integral part of a complete examination. Photoelectric Plethysmograph (PPG) waveforms and toe systolic pressure readings are included as required and additional duplex testing as needed. Limited examinations for reoccurring indications may be performed as  noted.  ABI Findings: +--------+------------------+-----+--------+--------+ Right   Rt Pressure (mmHg)IndexWaveformComment  +--------+------------------+-----+--------+--------+ [id number]                                     +--------+------------------+-----+--------+--------+ +---------+------------------+-----+----------+-------+ Left     Lt Pressure (mmHg)IndexWaveform  Comment +---------+------------------+-----+----------+-------+ Brachial 127                                       +---------+------------------+-----+----------+-------+ PTA      97                0.76 monophasic        +---------+------------------+-----+----------+-------+ DP       50                0.39 monophasic        +---------+------------------+-----+----------+-------+ Great Toe67                0.53                   +---------+------------------+-----+----------+-------+ +-------+------------------------+-----------+------------+------------+ ABI/TBIToday's ABI             Today's TBIPrevious ABIPrevious TBI +-------+------------------------+-----------+------------+------------+ Right  BKA                     BKA        unreliable  bandage      +-------+------------------------+-----------+------------+------------+ Left   0.76 (may be unreliable)0.53       unreliable  0.50         +-------+------------------------+-----------+------------+------------+  Left TBIs appear essentially unchanged compared to prior study on 11/17/2020.  Summary: Left: Resting left ankle-brachial index indicates moderate left lower extremity arterial disease. The left toe-brachial index is abnormal. ABIs may be unreliable.  *See table(s) above for measurements and observations.  Electronically signed by [name] MD on 02/18/2021 at 1:26:01 PM.    Final    No images are attached to the encounter.  Labs: Lab Results  Component Value Date   HGBA1C 7.5 (H) 02/01/2021   HGBA1C 7.7 (H) 12/12/2020   HGBA1C 6.6 (H) 04/10/2019   REPTSTATUS 04/18/2019 FINAL 04/13/2019   GRAMSTAIN  04/13/2019    RARE WBC PRESENT, PREDOMINANTLY PMN NO ORGANISMS SEEN    CULT  04/13/2019    No growth aerobically or anaerobically. Performed at [facility], [place] [address], [place],  [address]      Lab Results  Component Value Date   ALBUMIN 3.5 01/31/2021   ALBUMIN 2.9 (L) 12/17/2020   ALBUMIN 4.4 04/10/2019    Lab Results  Component Value Date   MG 2.7 (H)  02/01/2021   No results found for: VD25OH  No results found for: PREALBUMIN CBC EXTENDED Latest Ref Rng & Units 02/03/2021 02/01/2021 01/31/2021  WBC 4.0 - 10.5 K/uL 6.2 9.3 11.6(H)  RBC 4.22 - 5.81 MIL/uL 3.36(L) 3.59(L) 3.88(L)  HGB 13.0 - 17.0 g/dL 9.9(L) 10.6(L) 11.2(L)  HCT 39.0 - 52.0 % 28.9(L) 32.1(L) 35.9(L)  PLT 150 - 400 K/uL 152 171 187  NEUTROABS 1.7 - 7.7 K/uL - - -  LYMPHSABS 0.7 - 4.0 K/uL - - -     There is no height or weight on file to calculate BMI.  Orders:  No orders of the defined types were placed in this encounter.  No orders of the defined  types were placed in this encounter.    Procedures: No procedures performed  Clinical Data: No additional findings.  ROS:  All other systems negative, except as noted in the HPI. Review of Systems  Objective: Vital Signs: There were no vitals taken for this visit.  Specialty Comments:  No specialty comments available.  PMFS History: Patient Active Problem List   Diagnosis Date Noted  . Hypokalemia   . Constipation 02/02/2021  . Fecal impaction ([place]) 02/01/2021  . Wound dehiscence 01/09/2021  . Dehiscence of amputation stump ([place])   . Status post percutaneous transluminal coronary angioplasty 01/06/2021  . Type II diabetes mellitus, uncontrolled ([place]) 01/06/2021  . Acute pancreatitis 01/06/2021  . Diabetic peripheral vascular disease ([place]) 01/06/2021  . Encounter for screening for other disorder 01/06/2021  . Enlarged prostate 01/06/2021  . Foot ulcer, right ([place]) 01/06/2021  . Gout 01/06/2021  . Headache 01/06/2021  . Neck pain 01/06/2021  . Hypoglycemia 01/06/2021  . Loss of appetite 01/06/2021  . 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 Peripheral neuropathy 01/06/2021  . Sciatica 01/06/2021  . Vitamin B12 deficiency 01/06/2021  . Vitamin D deficiency 01/06/2021  . Weakness 01/06/2021  . Diabetes mellitus type 2 with neurological manifestations ([place]) 01/06/2021  . Protein-calorie  malnutrition, severe 12/26/2020  . Acute blood loss anemia 12/26/2020  . Prerenal azotemia 12/26/2020  . Below-knee amputation of right lower extremity ([place]) 12/12/2020  . Gangrene of right foot ([place])   . Diabetic neuropathy ([place]) 11/17/2020  . Hyperglycemia due to type 2 diabetes mellitus ([place]) 11/17/2020  . Long term (current) use of insulin ([place]) 11/17/2020  . Obesity 11/17/2020  . 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/2019  . Hip dislocation, right ([place]) 04/13/2019  . Other intervertebral disc degeneration, lumbar region 03/30/2019  . CAD (coronary artery disease) 01/30/2019  . Tobacco abuse 01/30/2019  . 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  . Burn, foot, second degree, left, initial encounter 06/08/2017  . 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 S/P total knee arthroplasty, left 10/26/2016  . Hyperlipidemia 09/04/2014  . Thrombocytopenia ([place])   . Precordial chest pain 04/05/2014  . Coronary atherosclerosis of native coronary artery 10/01/2013  . Other and unspecified hyperlipidemia 10/01/2013  . Primary hypertension 10/01/2013  . Diabetes mellitus ([place]) 10/01/2013  . Esophageal reflux 10/01/2013  . Hypertrophy of prostate without urinary obstruction and other lower urinary tract symptoms (LUTS) 10/01/2013   Past Medical History:  Diagnosis Date  . Allergic rhinitis   . Allergic rhinitis   . Arthritis   . Basal cell carcinoma 11/01/2019    bcc left chest treatment [place] cx3 31fu   . Chronic leg pain    right  . Chronic lower back pain   . Coronary artery disease    a. Stenting to RCA 2004; staged DES to LAD and Cx 2004. DES to mRCA 2012. b. DES to mCx, PTCA to dCx 11/2011. c. Lateral wall MI 2013 s/p PTCA to distal Cx & DES to mid OM2 11/2011. d. Low risk nuc 04/2014, EF wnl.  Marland Kitchen COVID-19   . Diabetes mellitus    Insulin dependent  . Diabetic neuropathy (HCC)    MILD  . Diverticulosis   . Dysrhythmia   . Gilbert syndrome   . Gout    right  wrist; right foot; right elbow; have had it since 1970's  . H/O hiatal hernia   . Heart murmur   . History of echocardiogram    aortic sclerosis per echo 12/09  EF 65%, otherwise normal  . History of hemorrhoids    BLEEDING  . History of kidney stones    h/o  . Hypertension    Diagnosed 1995   . Myocardial infarction ([place])   . Pancreatic pseudocyst    a. s/p remote drainage 2006.  Marland Kitchen Thrombocytopenia ([place])    Seen on oldest labs in system from 2004  . Vitamin B 12 deficiency    orally replaced    Family History  Problem Relation Age of Onset  . Diabetes Mother   . Hyperlipidemia Mother   . Hypertension Mother   . Cancer Father   . Hypertension Father   . Diabetes Sister   . Hypertension Sister   . Cancer Brother   . Heart attack Neg Hx     Past Surgical History:  Procedure Laterality Date  . ABDOMINAL AORTOGRAM W/LOWER EXTREMITY Bilateral 08/08/2020   Procedure: ABDOMINAL AORTOGRAM W/LOWER EXTREMITY;  Surgeon: Angelia Mould, MD;  Location: [facility];  Service: Cardiovascular;  Laterality: Bilateral;  . AMPUTATION Right 12/12/2020   Procedure: RIGHT BELOW KNEE AMPUTATION;  Surgeon: [name], MD;  Location: [facility];  Service: Orthopedics;  Laterality: Right;  . BACK SURGERY     "total of 3 times" S/P fall   . CARPAL TUNNEL RELEASE Bilateral   . CHOLECYSTECTOMY  1990's  . COLONOSCOPY    . CORONARY ANGIOPLASTY  11/11/11  . CORONARY ANGIOPLASTY WITH STENT PLACEMENT  09/30/2011   "1 then; makes a total of 4"  . CORONARY ANGIOPLASTY WITH STENT PLACEMENT  11/11/11   "1; makes a total of 5"  . INGUINAL HERNIA REPAIR  2003   right  . JOINT REPLACEMENT Right 04/03/2002   hip replacment  . KNEE ARTHROSCOPY  1990's   left  . LEFT HEART CATHETERIZATION WITH CORONARY ANGIOGRAM N/A 09/30/2011   Procedure: LEFT HEART CATHETERIZATION WITH CORONARY ANGIOGRAM;  Surgeon: Jettie Booze, MD;  Location: [facility] LAB;  Service: Cardiovascular;  Laterality: N/A;  possible  PCI  . LEFT HEART CATHETERIZATION WITH CORONARY ANGIOGRAM N/A 11/15/2011   Procedure: LEFT HEART CATHETERIZATION WITH CORONARY ANGIOGRAM;  Surgeon: Jettie Booze, MD;  Location: [facility] CATH LAB;  Service: Cardiovascular;  Laterality: N/A;  . PERCUTANEOUS CORONARY STENT INTERVENTION (PCI-S)  09/30/2011   Procedure: PERCUTANEOUS CORONARY STENT INTERVENTION (PCI-S);  Surgeon: Jettie Booze, MD;  Location: [facility] CATH LAB;  Service: Cardiovascular;;  . PERCUTANEOUS CORONARY STENT INTERVENTION (PCI-S) N/A 11/11/2011   Procedure: PERCUTANEOUS CORONARY STENT INTERVENTION (PCI-S);  Surgeon: Jettie Booze, MD;  Location: [facility] CATH LAB;  Service: Cardiovascular;  Laterality: N/A;  . PERIPHERAL VASCULAR BALLOON ANGIOPLASTY Right 08/08/2020   Procedure: PERIPHERAL VASCULAR BALLOON ANGIOPLASTY;  Surgeon: Angelia Mould, MD;  Location: [facility];  Service: Cardiovascular;  Laterality: Right;  Posterior tibial   . SHOULDER SURGERY Right    X 2  . STUMP REVISION Right 01/09/2021   Procedure: REVISION RIGHT BELOW KNEE AMPUTATION;  Surgeon: [name], MD;  Location: [facility];  Service: Orthopedics;  Laterality: Right;  . TONSILLECTOMY  ~ 1948  . TOTAL HIP REVISION Right 04/13/2019   Procedure: RIGHT TOTAL HIP REVISION-POSTERIOR  APPROACH LATERAL;  Surgeon: Marybelle Killings, MD;  Location: [facility];  Service: Orthopedics;  Laterality: Right;  . TOTAL KNEE ARTHROPLASTY Left 07/23/2016   Procedure: LEFT TOTAL KNEE ARTHROPLASTY;  Surgeon: Marybelle Killings, MD;  Location: [facility];  Service: Orthopedics;  Laterality: Left;   Social History  Occupational History  . Occupation: Retired  Tobacco Use  . Smoking status: Former Research scientist (life sciences)  . Smokeless tobacco: Current User    Types: Chew  . Tobacco comment: quit 60 years ago  Vaping Use  . Vaping Use: Never used  Substance and Sexual Activity  . Alcohol use: No  . Drug use: No  . Sexual activity: Not Currently

## 2021-02-18 NOTE — Progress Notes (Signed)
REASON FOR VISIT:   Follow-up of peripheral vascular disease  MEDICAL ISSUES:   PERIPHERAL VASCULAR DISEASE: This patient has evidence of tibial artery occlusive disease on exam on the left but is asymptomatic.  His right below the knee amputation site is healing adequately.  Soon as the wounds of all healed he will be fitted for a prosthesis.  He does not smoke tobacco but does dip tobacco.  I have encouraged him to stay as active as possible.  I recommended a follow-up visit in 1 year with ABIs however daughter feels strongly about following him more closely so I think seeing him in 6 months would be perfectly reasonable.  They would like to see me at that time.  I have ordered ABIs in 6 months I will see him at that time.  He knows to call sooner if he has problems.   HPI:   Keith Espinoza is a pleasant 80 y.o. male who I been following with peripheral vascular disease.  Ultimately the wounds on his right leg did not heal and he required a right below the knee amputation by Dr. Sharol Given.  He still has a small wound on the pretibial area but overall the wound is making progress and he will be fitted for a prosthesis once this is healed.  He denies any claudication on the right although currently his activity is fairly limited.  He denies any history of rest pain or nonhealing wounds.  He does not smoke cigarettes but does dip tobacco.  Past Medical History:  Diagnosis Date  . Allergic rhinitis   . Allergic rhinitis   . Arthritis   . Basal cell carcinoma 11/01/2019    bcc left chest treatment [place] cx3 32fu   . Chronic leg pain    right  . Chronic lower back pain   . Coronary artery disease    a. Stenting to RCA 2004; staged DES to LAD and Cx 2004. DES to mRCA 2012. b. DES to mCx, PTCA to dCx 11/2011. c. Lateral wall MI 2013 s/p PTCA to distal Cx & DES to mid OM2 11/2011. d. Low risk nuc 04/2014, EF wnl.  Marland Kitchen COVID-19   . Diabetes mellitus    Insulin dependent  . Diabetic neuropathy (HCC)     MILD  . Diverticulosis   . Dysrhythmia   . Gilbert syndrome   . Gout    right wrist; right foot; right elbow; have had it since 1970's  . H/O hiatal hernia   . Heart murmur   . History of echocardiogram    aortic sclerosis per echo 12/09 EF 65%, otherwise normal  . History of hemorrhoids    BLEEDING  . History of kidney stones    h/o  . Hypertension    Diagnosed 1995   . Myocardial infarction ([place])   . Pancreatic pseudocyst    a. s/p remote drainage 2006.  Marland Kitchen Thrombocytopenia ([place])    Seen on oldest labs in system from 2004  . Vitamin B 12 deficiency    orally replaced    Family History  Problem Relation Age of Onset  . Diabetes Mother   . Hyperlipidemia Mother   . Hypertension Mother   . Cancer Father   . Hypertension Father   . Diabetes Sister   . Hypertension Sister   . Cancer Brother   . Heart attack Neg Hx     SOCIAL HISTORY: Social History   Tobacco Use  . Smoking status: Former Research scientist (life sciences)  .  Smokeless tobacco: Current User    Types: Chew  . Tobacco comment: quit 60 years ago  Substance Use Topics  . Alcohol use: No    Allergies  Allergen Reactions  . Simvastatin Other (See Comments)    SEVERE MYALGIAS   . Zetia [Ezetimibe] Other (See Comments)    MYALGIAS  . Dilaudid [Hydromorphone Hcl] Other (See Comments)    hallucination    Current Outpatient Medications  Medication Sig Dispense Refill  . acetaminophen (TYLENOL) 500 MG tablet Take 1,000 mg by mouth every 6 (six) hours as needed (pain).    Marland Kitchen allopurinol (ZYLOPRIM) 300 MG tablet Take 300 mg by mouth daily.    Marland Kitchen amLODipine (NORVASC) 5 MG tablet Take 1 tablet (5 mg total) by mouth daily. 30 tablet 0  . aspirin EC 81 MG tablet Take 81 mg by mouth daily.    . Cholecalciferol (VITAMIN D3) 50 MCG (2000 UT) TABS Take 2,000 Units by mouth daily.     . clopidogrel (PLAVIX) 75 MG tablet Take 1 tablet (75 mg total) by mouth daily. 30 tablet 0  . Cyanocobalamin (B-12) 2500 MCG TABS Take 2,500 mcg by mouth  daily.    . empagliflozin (JARDIANCE) 25 MG TABS tablet Take 25 mg by mouth daily.    Marland Kitchen gabapentin (NEURONTIN) 100 MG capsule Take 2 capsules (200 mg total) by mouth at bedtime. (Patient taking differently: Take 600 mg by mouth at bedtime.) 30 capsule 0  . gabapentin (NEURONTIN) 400 MG capsule Take 1 capsule (400 mg total) by mouth 3 (three) times daily. (Patient taking differently: Take 400 mg by mouth daily.) 90 capsule 0  . hydrochlorothiazide (HYDRODIURIL) 12.5 MG tablet Take 1 tablet (12.5 mg total) by mouth daily. 30 tablet 0  . insulin detemir (LEVEMIR FLEXTOUCH) 100 UNIT/ML FlexPen Inject 15-20 Units into the skin at bedtime. Dose is based on CBG    . isosorbide mononitrate (IMDUR) 30 MG 24 hr tablet Take 1 tablet (30 mg total) by mouth daily. Please schedule yearly appointment for future refills. Thank you 30 tablet 0  . linaclotide (LINZESS) 145 MCG CAPS capsule Take 1 capsule (145 mcg total) by mouth daily before breakfast. 30 capsule 1  . losartan (COZAAR) 50 MG tablet Take 1 tablet (50 mg total) by mouth daily. 30 tablet 0  . metFORMIN (GLUCOPHAGE) 1000 MG tablet Take 1 tablet (1,000 mg total) by mouth 2 (two) times daily.    . methocarbamol (ROBAXIN) 500 MG tablet Take 1 tablet (500 mg total) by mouth every 6 (six) hours as needed for muscle spasms. 60 tablet 0  . nitroGLYCERIN (NITROSTAT) 0.4 MG SL tablet Place 0.4 mg under the tongue every 5 (five) minutes x 3 doses as needed for chest pain.     . ONE TOUCH ULTRA TEST test strip CHECK BLOOD SUGAR ONCE DAILY AS DIRECTED  5  . oxyCODONE (OXY IR/ROXICODONE) 5 MG immediate release tablet Take 1 tablet (5 mg total) by mouth every 4 (four) hours as needed for severe pain. 30 tablet 0  . polyethylene glycol (MIRALAX) 17 g packet Take 17 g by mouth 2 (two) times daily. 28 each 3  . potassium chloride SA (KLOR-CON) 20 MEQ tablet Take 1 tablet (20 mEq total) by mouth daily. 20 tablet 0  . senna-docusate (SENOKOT-S) 8.6-50 MG tablet Take 1  tablet by mouth at bedtime. 30 tablet 2  . terazosin (HYTRIN) 5 MG capsule Take 1 capsule (5 mg total) by mouth at bedtime. 30 capsule 0  .  traMADol (ULTRAM) 50 MG tablet Take 1 tablet (50 mg total) by mouth every 6 (six) hours as needed for moderate pain. 28 tablet 0  . traZODone (DESYREL) 50 MG tablet Take 1 tablet (50 mg total) by mouth at bedtime as needed for sleep. (Patient taking differently: Take 50 mg by mouth 2 (two) times daily.)     No current facility-administered medications for this visit.    REVIEW OF SYSTEMS:  [X]  denotes positive finding, [ ]  denotes negative finding Cardiac  Comments:  Chest pain or chest pressure:    Shortness of breath upon exertion:    Short of breath when lying flat:    Irregular heart rhythm:        Vascular    Pain in calf, thigh, or hip brought on by ambulation:    Pain in feet at night that wakes you up from your sleep:     Blood clot in your veins:    Leg swelling:         Pulmonary    Oxygen at home:    Productive cough:     Wheezing:         Neurologic    Sudden weakness in arms or legs:     Sudden numbness in arms or legs:     Sudden onset of difficulty speaking or slurred speech:    Temporary loss of vision in one eye:     Problems with dizziness:         Gastrointestinal    Blood in stool:     Vomited blood:         Genitourinary    Burning when urinating:     Blood in urine:        Psychiatric    Major depression:         Hematologic    Bleeding problems:    Problems with blood clotting too easily:        Skin    Rashes or ulcers:        Constitutional    Fever or chills:     PHYSICAL EXAM:   Vitals:   02/18/21 1248  BP: 107/66  Pulse: (!) 105  Resp: 20  Temp: 97.8 F (36.6 C)  SpO2: 98%  Weight: 142 lb (64.4 kg)  Height: 5\' 10"  (1.778 m)    GENERAL: The patient is a well-nourished male, in no acute distress. The vital signs are documented above. CARDIAC: There is a regular rate and rhythm.   VASCULAR: I do not detect carotid bruits. On the left side he has a palpable femoral and popliteal pulse.  He has a monophasic dorsalis pedis and posterior tibial signal with a Doppler. He has a wound on the pretibial area of his right BKA.  There are no wounds on the left foot. PULMONARY: There is good air exchange bilaterally without wheezing or rales. MUSCULOSKELETAL: He has a right BKA. NEUROLOGIC: No focal weakness or paresthesias are detected. PSYCHIATRIC: The patient has a normal affect.  DATA:    ARTERIAL DOPPLER STUDY: I have independently interpreted the arterial Doppler study today.  On the left side there is a monophasic dorsalis pedis and posterior tibial signal.  ABI is 76%.  Toe pressure 67 mmHg.  The patient has a BKA on the right.  [name] [facility] [phone]

## 2021-02-18 NOTE — Progress Notes (Signed)
Cardiology Office Note   Date:  02/18/2021   ID:  [name], DOB [birth year], MRN [id number]  PCP:  [name], MD    No chief complaint on file.  CAD  Wt Readings from Last 3 Encounters:  02/18/21 142 lb 3.2 oz (64.5 kg)  02/05/21 142 lb (64.4 kg)  01/30/21 142 lb (64.4 kg)       History of Present Illness: Keith Espinoza is a 80 y.o. male   with history of CAD S/P stenting RCA 2004 with staged DES-LAD and Cfx 2004, DES RCA 2012, DES mCfx, PTCA dCfx 2013, low risk NST 04/2014.  He was advised to quit chewing tobacco.    He had COVID in Jan 2022.  No hospital stay required.    He had a leg amputation in 01/15/2023. His wife, Keith Espinoza, died in 2023-02-15.  She did not tolerate dialysis to ventricular tach.    Awaiting prosthetic leg.    Denies : Chest pain. Dizziness. Leg edema. Nitroglycerin use. Orthopnea. Palpitations. Paroxysmal nocturnal dyspnea. Shortness of breath. Syncope.      Past Medical History:  Diagnosis Date  . Allergic rhinitis   . Allergic rhinitis   . Arthritis   . Basal cell carcinoma 11/01/2019    bcc left chest treatment [place] cx3 [id number]   . Chronic leg pain    right  . Chronic lower back pain   . Coronary artery disease    a. Stenting to RCA 2004; staged DES to LAD and Cx 2004. DES to mRCA 2012. b. DES to mCx, PTCA to dCx 11/2011. c. Lateral wall MI 2013 s/p PTCA to distal Cx & DES to mid OM2 11/2011. d. Low risk nuc 04/2014, EF wnl.  Marland Kitchen COVID-19   . Diabetes mellitus    Insulin dependent  . Diabetic neuropathy (HCC)    MILD  . Diverticulosis   . Dysrhythmia   . Gilbert syndrome   . Gout    right wrist; right foot; right elbow; have had it since 1970's  . H/O hiatal hernia   . Heart murmur   . History of echocardiogram    aortic sclerosis per echo 12/09 EF 65%, otherwise normal  . History of hemorrhoids    BLEEDING  . History of kidney stones    h/o  . Hypertension    Diagnosed 1995   . Myocardial infarction ([place])   . Pancreatic  pseudocyst    a. s/p remote drainage 2006.  Marland Kitchen Thrombocytopenia ([place])    Seen on oldest labs in system from 2004  . Vitamin B 12 deficiency    orally replaced    Past Surgical History:  Procedure Laterality Date  . ABDOMINAL AORTOGRAM W/LOWER EXTREMITY Bilateral 08/08/2020   Procedure: ABDOMINAL AORTOGRAM W/LOWER EXTREMITY;  Surgeon: Angelia Mould, MD;  Location: [facility];  Service: Cardiovascular;  Laterality: Bilateral;  . AMPUTATION Right 12/12/2020   Procedure: RIGHT BELOW KNEE AMPUTATION;  Surgeon: [name], MD;  Location: [facility];  Service: Orthopedics;  Laterality: Right;  . BACK SURGERY     "total of 3 times" S/P fall   . CARPAL TUNNEL RELEASE Bilateral   . CHOLECYSTECTOMY  1990's  . COLONOSCOPY    . CORONARY ANGIOPLASTY  11/11/11  . CORONARY ANGIOPLASTY WITH STENT PLACEMENT  09/30/2011   "1 then; makes a total of 4"  . CORONARY ANGIOPLASTY WITH STENT PLACEMENT  11/11/11   "1; makes a total of 5"  . INGUINAL HERNIA REPAIR  2003   right  . JOINT REPLACEMENT Right 04/03/2002   hip replacment  . KNEE ARTHROSCOPY  1990's   left  . LEFT HEART CATHETERIZATION WITH CORONARY ANGIOGRAM N/A 09/30/2011   Procedure: LEFT HEART CATHETERIZATION WITH CORONARY ANGIOGRAM;  Surgeon: Jettie Booze, MD;  Location[facility];  Service: Cardiovascular;  Laterality: N/A;  possible PCI  . LEFT HEART CATHETERIZATION WITH CORONARY ANGIOGRAM N/A 11/15/2011   Procedure: LEFT HEART CATHETERIZATION WITH CORONARY ANGIOGRAM;  Surgeon: Jettie Booze, MD;  Location: [facility] C[facility] LAB;  Service: Cardiovascular;  Laterality: N/A;  . PERCUTANEOUS CORONARY STENT INTERVENTION (PCI-S)  09/30/2011   Procedure: PERCUTANEOUS CORONARY STENT INTERVENTION (PCI-S);  Surgeon: Jettie Booze, MD;  Location: [facility] CATH LAB;  Service: Cardiovascular;;  . PERCUTANEOUS CORONARY STENT INTERVENTION (PCI-S) N/A 11/11/2011   Procedure: PERCUTANEOUS CORONARY STENT INTERVENTION (PCI-S);  Surgeon: Jettie Booze, MD;  Location: [facility] CATH LAB;  Service: Cardiovascular;  Laterality: N/A;  . PERIPHERAL VASCULAR BALLOON ANGIOPLASTY Right 08/08/2020   Procedure: PERIPHERAL VASCULAR BALLOON ANGIOPLASTY;  Surgeon: Angelia Mould, MD;  Location: [facility];  Service: Cardiovascular;  Laterality: Right;  Posterior tibial   . SHOULDER SURGERY Right    X 2  . STUMP REVISION Right 01/09/2021   Procedure: REVISION RIGHT BELOW KNEE AMPUTATION;  Surgeon: [name], MD;  Location: [facility];  Service: Orthopedics;  Laterality: Right;  . TONSILLECTOMY  ~ 1948  . TOTAL HIP REVISION Right 04/13/2019   Procedure: RIGHT TOTAL HIP REVISION-POSTERIOR  APPROACH LATERAL;  Surgeon: Marybelle Killings, MD;  Location: [facility];  Service: Orthopedics;  Laterality: Right;  . TOTAL KNEE ARTHROPLASTY Left 07/23/2016   Procedure: LEFT TOTAL KNEE ARTHROPLASTY;  Surgeon: Marybelle Killings, MD;  Location: [facility];  Service: Orthopedics;  Laterality: Left;     Current Outpatient Medications  Medication Sig Dispense Refill  . acetaminophen (TYLENOL) 500 MG tablet Take 1,000 mg by mouth every 6 (six) hours as needed (pain).    Marland Kitchen allopurinol (ZYLOPRIM) 300 MG tablet Take 300 mg by mouth daily.    Marland Kitchen amLODipine (NORVASC) 5 MG tablet Take 1 tablet (5 mg total) by mouth daily. 30 tablet 0  . aspirin EC 81 MG tablet Take 81 mg by mouth daily.    . Cholecalciferol (VITAMIN D3) 50 MCG (2000 UT) TABS Take 2,000 Units by mouth daily.     . clopidogrel (PLAVIX) 75 MG tablet Take 1 tablet (75 mg total) by mouth daily. 30 tablet 0  . Cyanocobalamin (B-12) 2500 MCG TABS Take 2,500 mcg by mouth daily.    . empagliflozin (JARDIANCE) 25 MG TABS tablet Take 25 mg by mouth daily.    Marland Kitchen gabapentin (NEURONTIN) 100 MG capsule Take 2 capsules (200 mg total) by mouth at bedtime. (Patient taking differently: Take 600 mg by mouth at bedtime.) 30 capsule 0  . gabapentin (NEURONTIN) 400 MG capsule Take 1 capsule (400 mg total) by mouth 3 (three) times daily.  (Patient taking differently: Take 400 mg by mouth daily.) 90 capsule 0  . hydrochlorothiazide (HYDRODIURIL) 12.5 MG tablet Take 1 tablet (12.5 mg total) by mouth daily. 30 tablet 0  . insulin detemir (LEVEMIR FLEXTOUCH) 100 UNIT/ML FlexPen Inject 15-20 Units into the skin at bedtime. Dose is based on CBG    . isosorbide mononitrate (IMDUR) 30 MG 24 hr tablet Take 1 tablet (30 mg total) by mouth daily. Please schedule yearly appointment for future refills. Thank you 30 tablet 0  . linaclotide (  LINZESS) 145 MCG CAPS capsule Take 1 capsule (145 mcg total) by mouth daily before breakfast. 30 capsule 1  . losartan (COZAAR) 50 MG tablet Take 1 tablet (50 mg total) by mouth daily. 30 tablet 0  . metFORMIN (GLUCOPHAGE) 1000 MG tablet Take 1 tablet (1,000 mg total) by mouth 2 (two) times daily.    . methocarbamol (ROBAXIN) 500 MG tablet Take 1 tablet (500 mg total) by mouth every 6 (six) hours as needed for muscle spasms. 60 tablet 0  . nitroGLYCERIN (NITROSTAT) 0.4 MG SL tablet Place 0.4 mg under the tongue every 5 (five) minutes x 3 doses as needed for chest pain.     . ONE TOUCH ULTRA TEST test strip CHECK BLOOD SUGAR ONCE DAILY AS DIRECTED  5  . oxyCODONE (OXY IR/ROXICODONE) 5 MG immediate release tablet Take 1 tablet (5 mg total) by mouth every 4 (four) hours as needed for severe pain. 30 tablet 0  . polyethylene glycol (MIRALAX) 17 g packet Take 17 g by mouth 2 (two) times daily. 28 each 3  . potassium chloride SA (KLOR-CON) 20 MEQ tablet Take 1 tablet (20 mEq total) by mouth daily. 20 tablet 0  . senna-docusate (SENOKOT-S) 8.6-50 MG tablet Take 1 tablet by mouth at bedtime. 30 tablet 2  . terazosin (HYTRIN) 5 MG capsule Take 1 capsule (5 mg total) by mouth at bedtime. 30 capsule 0  . traMADol (ULTRAM) 50 MG tablet Take 1 tablet (50 mg total) by mouth every 6 (six) hours as needed for moderate pain. 28 tablet 0  . traZODone (DESYREL) 50 MG tablet Take 1 tablet (50 mg total) by mouth at bedtime as needed  for sleep. (Patient taking differently: Take 50 mg by mouth 2 (two) times daily.)     No current facility-administered medications for this visit.    Allergies:   Simvastatin, Zetia [ezetimibe], and Dilaudid [hydromorphone hcl]    Social History:  The patient  reports that he has quit smoking. His smokeless tobacco use includes chew. He reports that he does not drink alcohol and does not use drugs.   Family History:  The patient's family history includes Cancer in his brother and father; Diabetes in his mother and sister; Hyperlipidemia in his mother; Hypertension in his father, mother, and sister.    ROS:  Please see the history of present illness.   Otherwise, review of systems are positive for recent amputation- now in wheelchair.   All other systems are reviewed and negative.    PHYSICAL EXAM: VS:  BP (!) 110/52   Pulse 97   Ht 5\' 10"  (1.778 m)   Wt 142 lb 3.2 oz (64.5 kg)   SpO2 96%   BMI 20.40 kg/m  , BMI Body mass index is 20.4 kg/m. GEN: Well nourished, well developed, in no acute distress  HEENT: normal  Neck: no JVD, carotid bruits, or masses Cardiac: RRR; no murmurs, rubs, or gallops,no edema  Respiratory:  clear to auscultation bilaterally, normal work of breathing GI: soft, nontender, nondistended, + BS MS: no deformity or atrophy ; right BKA  Skin: warm and dry, no rash Neuro:  Strength and sensation are intact Psych: euthymic mood, full affect   EKG:   The ekg ordered today demonstrates NSR, inferior Q waves, nonspecific ST changes   Recent Labs: 01/31/2021: ALT 13 02/01/2021: Magnesium 2.7 02/03/2021: BUN 20; Creatinine, Ser 0.90; Hemoglobin 9.9; Platelets 152; Potassium 2.8; Sodium 137   Lipid Panel    Component Value Date/Time  CHOL 117 04/06/2014 0230   TRIG 123 04/06/2014 0230   HDL 34 (L) 04/06/2014 0230   CHOLHDL 3.4 04/06/2014 0230   VLDL 25 04/06/2014 0230   LDLCALC 58 04/06/2014 0230     Other studies Reviewed: Additional studies/ records  that were reviewed today with results demonstrating: hospital records reviewed.   ASSESSMENT AND PLAN:  1. CAD: No angina on medical therapy.  Continue aggressive secondary prevention. 2. DM: A1C 7.5 at last check.  He is having difficulty maintaining blood sugars.  Healthy diet recommended.  He is also losing weight. 3. Hypertension: Blood pressures well controlled.  Given his weight loss, he may not need as much medication.  He will monitor at home and let us know. 4. Hyperlipidemia: LDL 69 and February 2022.  Continue rosuvastatin twice a week   Current medicines are reviewed at length with the patient today.  The patient concerns regarding his medicines were addressed.  The following changes have been made:  No change  Labs/ tests ordered today include:  No orders of the defined types were placed in this encounter.   Recommend 150 minutes/week of aerobic exercise Low fat, low carb, high fiber diet recommended  Disposition:   FU in 1 year   Signed, Larae Grooms, MD  02/18/2021 12:03 PM    [facility] [address], [place], [place]  [address] Phone: [phone]; Fax: [phone]

## 2021-02-19 ENCOUNTER — Other Ambulatory Visit: Payer: Self-pay

## 2021-02-19 DIAGNOSIS — I739 Peripheral vascular disease, unspecified: Secondary | ICD-10-CM

## 2021-02-23 DIAGNOSIS — E1165 Type 2 diabetes mellitus with hyperglycemia: Secondary | ICD-10-CM | POA: Diagnosis not present

## 2021-02-23 DIAGNOSIS — I25118 Atherosclerotic heart disease of native coronary artery with other forms of angina pectoris: Secondary | ICD-10-CM | POA: Diagnosis not present

## 2021-02-23 DIAGNOSIS — E1151 Type 2 diabetes mellitus with diabetic peripheral angiopathy without gangrene: Secondary | ICD-10-CM | POA: Diagnosis not present

## 2021-02-23 DIAGNOSIS — E1140 Type 2 diabetes mellitus with diabetic neuropathy, unspecified: Secondary | ICD-10-CM | POA: Diagnosis not present

## 2021-02-23 DIAGNOSIS — N401 Enlarged prostate with lower urinary tract symptoms: Secondary | ICD-10-CM | POA: Diagnosis not present

## 2021-02-23 DIAGNOSIS — K219 Gastro-esophageal reflux disease without esophagitis: Secondary | ICD-10-CM | POA: Diagnosis not present

## 2021-02-23 DIAGNOSIS — E1342 Other specified diabetes mellitus with diabetic polyneuropathy: Secondary | ICD-10-CM | POA: Diagnosis not present

## 2021-02-23 DIAGNOSIS — E785 Hyperlipidemia, unspecified: Secondary | ICD-10-CM | POA: Diagnosis not present

## 2021-02-23 DIAGNOSIS — I10 Essential (primary) hypertension: Secondary | ICD-10-CM | POA: Diagnosis not present

## 2021-02-25 ENCOUNTER — Other Ambulatory Visit: Payer: Self-pay | Admitting: *Deleted

## 2021-02-25 DIAGNOSIS — Z4781 Encounter for orthopedic aftercare following surgical amputation: Secondary | ICD-10-CM | POA: Diagnosis not present

## 2021-02-25 DIAGNOSIS — K5909 Other constipation: Secondary | ICD-10-CM | POA: Diagnosis not present

## 2021-02-25 DIAGNOSIS — E1165 Type 2 diabetes mellitus with hyperglycemia: Secondary | ICD-10-CM | POA: Diagnosis not present

## 2021-02-25 DIAGNOSIS — Z89511 Acquired absence of right leg below knee: Secondary | ICD-10-CM | POA: Diagnosis not present

## 2021-02-25 DIAGNOSIS — S81001D Unspecified open wound, right knee, subsequent encounter: Secondary | ICD-10-CM | POA: Diagnosis not present

## 2021-02-25 DIAGNOSIS — E1151 Type 2 diabetes mellitus with diabetic peripheral angiopathy without gangrene: Secondary | ICD-10-CM | POA: Diagnosis not present

## 2021-02-25 NOTE — Patient Outreach (Signed)
Keith Espinoza) Care Management  02/25/2021  Keith Espinoza 07-10-1941 [id number]   Outgoing call placed to member, state he continues to slowly improve.  Happy with wound healing, will be able to fit for prosthetic soon.  Denies any urgent concerns, encouraged to contact this care manager with questions.  Agrees to follow up within the next month.  Goals Addressed            This Visit's Progress   . ( THN)Monitor and Manage My Blood Sugar   On track    Timeframe:  Long-Range Goal Priority:  Medium Start Date:  [id number]                        Expected End Date:  [id number]                    Follow Up Date [id number]   - check blood sugar at prescribed times - check blood sugar if I feel it is too high or too low - enter blood sugar readings and medication or insulin into daily log - take the blood sugar log to all doctor visits - take the blood sugar meter to all doctor visits    Why is this important?   Checking your blood sugar at home helps to keep it from getting very high or very low.  Writing the results in a diary or log helps the doctor know how to care for you.  Your blood sugar log should have the time, date and the results.  Also, write down the amount of insulin or other medicine that you take.  Other information, like what you ate, exercise done and how you were feeling, will also be helpful.     Notes:   4/19 - Encouraged to check blood sugars daily  5/10 - Reminded of importance of managing daily blood sugars are with wound healing.  Unable to report blood sugar reading for today.  5/25 - report blood sugars are still slightly elevated, today was 294.  Has appointment with endocrinology on 6/21    . [facility]) Make and Keep All Appointments   On track    Timeframe:  Long-Range Goal Priority:  Medium Start Date:   [id number]                          Expected End Date:   [id number]                   Follow Up Date [id number]   - call to cancel if  needed - keep a calendar with prescription refill dates - keep a calendar with appointment dates    Why is this important?   Part of staying healthy is seeing the doctor for follow-up care.  If you forget your appointments, there are some things you can do to stay on track.    Notes:  Need to schedule exam  4/19 - Recently discharged from readmission - Reviewed follow up scheduled with member, had visit with ortho on yesterday, repeat next week  5/10 - Follow up with PCP completed yesterday.  Cardiology, vascular, and ortho appointments all scheduled for 5/18  5/25 - Confirms all 5/18 appointments were attended, will follow up with ortho on 6/7    . (THN)Perform Foot Care   On track    Timeframe:  Long-Range Goal Priority:  Medium Start Date:  [id number]  Expected End Date:     [id number]                 Follow Up Date [id number]   - check feet daily for cuts, sores or redness - keep feet up while sitting - trim toenails straight across - wash and dry feet carefully every day - wear comfortable, cotton socks - wear comfortable, well-fitting shoes    Why is this important?   Good foot care is very important when you have diabetes.  There are many things you can do to keep your feet healthy and catch a problem early.    Notes:  Patient has ulcers on toes. Great toe has lost its toenail and is now draining [id number] foot is healing some  4/19 - Patient had right BKA, admitted for revision, discussed stump care and follow up with MD  5/10 - Member report staples removed, now has a few stitches with minimal drainage.  Plan is to fit for prosthetic once wound is fully healed.  Remains active with home health for nursing and PT/OT  5/25 - Report wound on stump healing, no issues with remaining foot    . (THN)THN Set My Target A1C   On track    Timeframe:  Long-Range Goal Priority:  High Start Date:  [id number]                           Expected End Date:     [id number]                  Follow Up Date [id number]   - set target A1C    Why is this important?   Your target A1C is decided together by you and your doctor.  It is based on several things like your age and other health issues.    Notes: 7.0  4/19 - Patient aware of proper diet to manage A1C as well as adherence to medication regime  5/10 - Education sent on managing diabetes and decreasing A1C  5/25 - Understand importance of managing diabetes in relation to wound care      Valente David, RN, MSN [place] Manager [phone]

## 2021-03-04 ENCOUNTER — Other Ambulatory Visit: Payer: Self-pay | Admitting: Physical Medicine and Rehabilitation

## 2021-03-04 DIAGNOSIS — Z89511 Acquired absence of right leg below knee: Secondary | ICD-10-CM | POA: Diagnosis not present

## 2021-03-04 DIAGNOSIS — K5909 Other constipation: Secondary | ICD-10-CM | POA: Diagnosis not present

## 2021-03-04 DIAGNOSIS — E1165 Type 2 diabetes mellitus with hyperglycemia: Secondary | ICD-10-CM | POA: Diagnosis not present

## 2021-03-04 DIAGNOSIS — E1151 Type 2 diabetes mellitus with diabetic peripheral angiopathy without gangrene: Secondary | ICD-10-CM | POA: Diagnosis not present

## 2021-03-04 DIAGNOSIS — Z4781 Encounter for orthopedic aftercare following surgical amputation: Secondary | ICD-10-CM | POA: Diagnosis not present

## 2021-03-04 DIAGNOSIS — S81001D Unspecified open wound, right knee, subsequent encounter: Secondary | ICD-10-CM | POA: Diagnosis not present

## 2021-03-09 DIAGNOSIS — H40033 Anatomical narrow angle, bilateral: Secondary | ICD-10-CM | POA: Diagnosis not present

## 2021-03-09 DIAGNOSIS — H2513 Age-related nuclear cataract, bilateral: Secondary | ICD-10-CM | POA: Diagnosis not present

## 2021-03-10 ENCOUNTER — Encounter: Payer: Self-pay | Admitting: Orthopedic Surgery

## 2021-03-10 ENCOUNTER — Ambulatory Visit (INDEPENDENT_AMBULATORY_CARE_PROVIDER_SITE_OTHER): Payer: Medicare Other | Admitting: Orthopedic Surgery

## 2021-03-10 DIAGNOSIS — Z89511 Acquired absence of right leg below knee: Secondary | ICD-10-CM
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
## 2021-03-12 DIAGNOSIS — K5909 Other constipation: Secondary | ICD-10-CM | POA: Diagnosis not present

## 2021-03-12 DIAGNOSIS — E1165 Type 2 diabetes mellitus with hyperglycemia: Secondary | ICD-10-CM | POA: Diagnosis not present

## 2021-03-12 DIAGNOSIS — S81001D Unspecified open wound, right knee, subsequent encounter: Secondary | ICD-10-CM | POA: Diagnosis not present

## 2021-03-12 DIAGNOSIS — E1151 Type 2 diabetes mellitus with diabetic peripheral angiopathy without gangrene: Secondary | ICD-10-CM | POA: Diagnosis not present

## 2021-03-12 DIAGNOSIS — Z4781 Encounter for orthopedic aftercare following surgical amputation: Secondary | ICD-10-CM | POA: Diagnosis not present

## 2021-03-12 DIAGNOSIS — Z89511 Acquired absence of right leg below knee: Secondary | ICD-10-CM | POA: Diagnosis not present

## 2021-03-17 DIAGNOSIS — E1165 Type 2 diabetes mellitus with hyperglycemia: Secondary | ICD-10-CM | POA: Diagnosis not present

## 2021-03-17 DIAGNOSIS — E1151 Type 2 diabetes mellitus with diabetic peripheral angiopathy without gangrene: Secondary | ICD-10-CM | POA: Diagnosis not present

## 2021-03-17 DIAGNOSIS — K219 Gastro-esophageal reflux disease without esophagitis: Secondary | ICD-10-CM | POA: Diagnosis not present

## 2021-03-17 DIAGNOSIS — E1342 Other specified diabetes mellitus with diabetic polyneuropathy: Secondary | ICD-10-CM | POA: Diagnosis not present

## 2021-03-17 DIAGNOSIS — I10 Essential (primary) hypertension: Secondary | ICD-10-CM | POA: Diagnosis not present

## 2021-03-17 DIAGNOSIS — E1140 Type 2 diabetes mellitus with diabetic neuropathy, unspecified: Secondary | ICD-10-CM | POA: Diagnosis not present

## 2021-03-17 DIAGNOSIS — E785 Hyperlipidemia, unspecified: Secondary | ICD-10-CM | POA: Diagnosis not present

## 2021-03-24 ENCOUNTER — Ambulatory Visit (INDEPENDENT_AMBULATORY_CARE_PROVIDER_SITE_OTHER): Payer: Medicare Other | Admitting: Internal Medicine

## 2021-03-24 ENCOUNTER — Other Ambulatory Visit: Payer: Self-pay

## 2021-03-24 VITALS — BP 124/55 | HR 77 | Temp 98.4°F | Resp 12

## 2021-03-24 DIAGNOSIS — D3502 Benign neoplasm of left adrenal gland: Secondary | ICD-10-CM | POA: Diagnosis not present

## 2021-03-24 DIAGNOSIS — E1159 Type 2 diabetes mellitus with other circulatory complications: Secondary | ICD-10-CM

## 2021-03-24 DIAGNOSIS — Z794 Long term (current) use of insulin: Secondary | ICD-10-CM

## 2021-03-24 DIAGNOSIS — E1142 Type 2 diabetes mellitus with diabetic polyneuropathy: Secondary | ICD-10-CM | POA: Diagnosis not present

## 2021-03-24 MED ORDER — DEXCOM G6 RECEIVER DEVI: 1.0000 | 0 refills | Status: DC

## 2021-03-24 MED ORDER — INSULIN PEN NEEDLE 32G X 4 MM MISC: 1.0000 | Freq: Every day | 3 refills | Status: DC

## 2021-03-24 MED ORDER — EMPAGLIFLOZIN 25 MG PO TABS: 25.0000 mg | ORAL_TABLET | Freq: Every day | ORAL | 3 refills | Status: DC

## 2021-03-24 MED ORDER — METFORMIN HCL 1000 MG PO TABS: 1000.0000 mg | ORAL_TABLET | Freq: Two times a day (BID) | ORAL | 3 refills | Status: DC

## 2021-03-24 MED ORDER — DEXCOM G6 SENSOR MISC: 1.0000 | 3 refills | Status: DC

## 2021-03-24 MED ORDER — DEXCOM G6 TRANSMITTER MISC: 1.0000 | 3 refills | Status: DC

## 2021-03-24 MED ORDER — LEVEMIR FLEXTOUCH 100 UNIT/ML ~~LOC~~ SOPN: 8.0000 [IU] | PEN_INJECTOR | Freq: Every day | SUBCUTANEOUS | 6 refills | Status: DC

## 2021-03-24 NOTE — Progress Notes (Signed)
Name: Keith Espinoza  MRN/ DOB: [id number], 1941/02/09   Age/ Sex: 80 y.o., male    PCP: [name], MD   Reason for Endocrinology Evaluation: Type 2 Diabetes Mellitus     Date of Initial Endocrinology Visit: 03/24/2021     PATIENT IDENTIFIER: Keith Espinoza is a 80 y.o. male with a past medical history of Right BK amputations due  PVD. The patient presented for initial endocrinology clinic visit on 03/24/2021 for consultative assistance with his diabetes management.    HPI: Keith Espinoza was    Diagnosed with DM in 2015 Prior Medications tried/Intolerance: Trulicity- weight loss . Glimepiride  Currently checking blood sugars 2 x / day,  before breakfast and bedtime .  Hypoglycemia episodes : yes                Symptoms: yes                 Frequency: 2/ week  Hemoglobin A1c has ranged from 6.6% in 2020, peaking at 7.7% in 2022. Patient required assistance for hypoglycemia:  Patient has required hospitalization within the last 1 year from hyper or hypoglycemia:   In terms of diet, the patient eats 3 meals  day with occasional snacks. Avoids sugar-sweetened beverages    Used to follow up with [facility]   ADRENAL HISTORY : Pt found to have a 1 cm left adrenal nodule on CT imaging of abdomen on 01/31/2021. Of note, the pt was noted to have hypokalemia , this is chronic and has been on KCL for ~ 5 yrs   Weight has been increasing - intentional      HOME DIABETES REGIMEN: Jardiance 25 mg daily  Levemir 10 units daily  Metformin 1000 mg BID     Statin: no ACE-I/ARB: yes Prior Diabetic Education: no    METER DOWNLOAD SUMMARY: Did not bring   DIABETIC COMPLICATIONS: Microvascular complications:  Neuropathy, retinal bleed patient is unsure if this is related to diabetes Denies: CKD Last eye exam: Completed 03/2021  Macrovascular complications:  PVD, CAD  Denies:  CVA   PAST HISTORY: Past Medical History:  Past Medical History:  Diagnosis Date   Allergic  rhinitis    Allergic rhinitis    Arthritis    Basal cell carcinoma 11/01/2019    bcc left chest treatment TX cx3 61f    Chronic leg pain    right   Chronic lower back pain    Coronary artery disease    a. Stenting to RCA 2004; staged DES to LAD and Cx 2004. DES to mRCA 2012. b. DES to mCx, PTCA to dCx 11/2011. c. Lateral wall MI 2013 s/p PTCA to distal Cx & DES to mid OM2 11/2011. d. Low risk nuc 04/2014, EF wnl.   COVID-19    Diabetes mellitus    Insulin dependent   Diabetic neuropathy (HCC)    MILD   Diverticulosis    Dysrhythmia    GRosanna Randysyndrome    Gout    right wrist; right foot; right elbow; have had it since 1970's   H/O hiatal hernia    Heart murmur    History of echocardiogram    aortic sclerosis per echo 12/09 EF 65%, otherwise normal   History of hemorrhoids    BLEEDING   History of kidney stones    h/o   Hypertension    Diagnosed 1995    Myocardial infarction ([facility]    Pancreatic pseudocyst    a. s/p remote drainage  2006.   Thrombocytopenia ([place])    Seen on oldest labs in system from 2004   Vitamin B 12 deficiency    orally replaced   Past Surgical History:  Past Surgical History:  Procedure Laterality Date   ABDOMINAL AORTOGRAM W/LOWER EXTREMITY Bilateral 08/08/2020   Procedure: ABDOMINAL AORTOGRAM W/LOWER EXTREMITY;  Surgeon: Angelia Mould, MD;  Location: [place] [facility];  Service: Cardiovascular;  Laterality: Bilateral;   AMPUTATION Right 12/12/2020   Procedure: RIGHT BELOW KNEE AMPUTATION;  Surgeon: [name], MD;  Location: [place];  Service: Orthopedics;  Laterality: Right;   BACK SURGERY     "total of 3 times" S/P fall    CARPAL TUNNEL RELEASE Bilateral    CHOLECYSTECTOMY  1990's   COLONOSCOPY     CORONARY ANGIOPLASTY  11/11/11   CORONARY ANGIOPLASTY WITH STENT PLACEMENT  09/30/2011   "1 then; makes a total of 4"   CORONARY ANGIOPLASTY WITH STENT PLACEMENT  11/11/11   "1; makes a total of 5"   INGUINAL HERNIA REPAIR  2003   right    JOINT REPLACEMENT Right 04/03/2002   hip replacment   KNEE ARTHROSCOPY  1990's   left   LEFT HEART CATHETERIZATION WITH CORONARY ANGIOGRAM N/A 09/30/2011   Procedure: LEFT HEART CATHETERIZATION WITH CORONARY ANGIOGRAM;  Surgeon: Jettie Booze, MD;  Location: [facility] CATH LAB;  Service: Cardiovascular;  Laterality: N/A;  possible PCI   LEFT HEART CATHETERIZATION WITH CORONARY ANGIOGRAM N/A 11/15/2011   Procedure: LEFT HEART CATHETERIZATION WITH CORONARY ANGIOGRAM;  Surgeon: Jettie Booze, MD;  Location: [facility] C[facility] LAB;  Service: Cardiovascular;  Laterality: N/A;   PERCUTANEOUS CORONARY STENT INTERVENTION (PCI-S)  09/30/2011   Procedure: PERCUTANEOUS CORONARY STENT INTERVENTION (PCI-S);  Surgeon: Jettie Booze, MD;  Location: [facility] CATH LAB;  Service: Cardiovascular;;   PERCUTANEOUS CORONARY STENT INTERVENTION (PCI-S) N/A 11/11/2011   Procedure: PERCUTANEOUS CORONARY STENT INTERVENTION (PCI-S);  Surgeon: Jettie Booze, MD;  Location: [facility] C[facility] LAB;  Service: Cardiovascular;  Laterality: N/A;   PERIPHERAL VASCULAR BALLOON ANGIOPLASTY Right 08/08/2020   Procedure: PERIPHERAL VASCULAR BALLOON ANGIOPLASTY;  Surgeon: Angelia Mould, MD;  Location: [place] [facility];  Service: Cardiovascular;  Laterality: Right;  Posterior tibial    SHOULDER SURGERY Right    X 2   STUMP REVISION Right 01/09/2021   Procedure: REVISION RIGHT BELOW KNEE AMPUTATION;  Surgeon: [name], MD;  Location: [place];  Service: Orthopedics;  Laterality: Right;   TONSILLECTOMY  ~ [place] Right 04/13/2019   Procedure: RIGHT TOTAL HIP REVISION-POSTERIOR  APPROACH LATERAL;  Surgeon: Marybelle Killings, MD;  Location: [place];  Service: Orthopedics;  Laterality: Right;   TOTAL KNEE ARTHROPLASTY Left 07/23/2016   Procedure: LEFT TOTAL KNEE ARTHROPLASTY;  Surgeon: Marybelle Killings, MD;  Location: [place];  Service: Orthopedics;  Laterality: Left;    Social History:  reports that he has quit smoking. His smokeless  tobacco use includes chew. He reports that he does not drink alcohol and does not use drugs. Family History:  Family History  Problem Relation Age of Onset   Diabetes Mother    Hyperlipidemia Mother    Hypertension Mother    Cancer Father    Hypertension Father    Diabetes Sister    Hypertension Sister    Cancer Brother    Heart attack Neg Hx      HOME MEDICATIONS: Allergies as of 03/24/2021       Reactions   Simvastatin  Other (See Comments)   SEVERE MYALGIAS   Zetia [ezetimibe] Other (See Comments)   MYALGIAS   Dilaudid [hydromorphone Hcl] Other (See Comments)   hallucination        Medication List        Accurate as of March 24, 2021  3:27 PM. If you have any questions, ask your nurse or doctor.          acetaminophen 500 MG tablet Commonly known as: TYLENOL Take 1,000 mg by mouth every 6 (six) hours as needed (pain).   allopurinol 300 MG tablet Commonly known as: ZYLOPRIM Take 300 mg by mouth daily.   amLODipine 5 MG tablet Commonly known as: NORVASC Take 1 tablet (5 mg total) by mouth daily.   aspirin EC 81 MG tablet Take 81 mg by mouth daily.   B-12 2500 MCG Tabs Take 2,500 mcg by mouth daily.   clopidogrel 75 MG tablet Commonly known as: PLAVIX Take 1 tablet (75 mg total) by mouth daily.   Dexcom G6 Receiver Devi 1 Device by Does not apply route as directed. Started by: Dorita Sciara, MD   Dexcom G6 Sensor Misc 1 Device by Does not apply route as directed. Started by: Dorita Sciara, MD   Dexcom G6 Transmitter Misc 1 Device by Does not apply route as directed. Started by: Dorita Sciara, MD   empagliflozin 25 MG Tabs tablet Commonly known as: JARDIANCE Take 25 mg by mouth daily.   gabapentin 400 MG capsule Commonly known as: NEURONTIN Take 1 capsule (400 mg total) by mouth 3 (three) times daily. What changed: when to take this   gabapentin 100 MG capsule Commonly known as: NEURONTIN Take 2 capsules (200 mg  total) by mouth at bedtime. What changed: how much to take   hydrochlorothiazide 12.5 MG tablet Commonly known as: HYDRODIURIL Take 1 tablet (12.5 mg total) by mouth daily.   isosorbide mononitrate 30 MG 24 hr tablet Commonly known as: IMDUR Take 1 tablet (30 mg total) by mouth daily. Please schedule yearly appointment for future refills. Thank you   Levemir FlexTouch 100 UNIT/ML FlexPen Generic drug: insulin detemir Inject 15-20 Units into the skin at bedtime. Dose is based on CBG   linaclotide 145 MCG Caps capsule Commonly known as: LINZESS Take 1 capsule (145 mcg total) by mouth daily before breakfast.   losartan 50 MG tablet Commonly known as: COZAAR Take 1 tablet (50 mg total) by mouth daily.   metFORMIN 1000 MG tablet Commonly known as: GLUCOPHAGE Take 1 tablet (1,000 mg total) by mouth 2 (two) times daily.   methocarbamol 500 MG tablet Commonly known as: ROBAXIN Take 1 tablet (500 mg total) by mouth every 6 (six) hours as needed for muscle spasms.   nitroGLYCERIN 0.4 MG SL tablet Commonly known as: NITROSTAT Place 0.4 mg under the tongue every 5 (five) minutes x 3 doses as needed for chest pain.   ONE TOUCH ULTRA TEST test strip Generic drug: glucose blood CHECK BLOOD SUGAR ONCE DAILY AS DIRECTED   oxyCODONE 5 MG immediate release tablet Commonly known as: Oxy IR/ROXICODONE Take 1 tablet (5 mg total) by mouth every 4 (four) hours as needed for severe pain.   polyethylene glycol 17 g packet Commonly known as: MiraLax Take 17 g by mouth 2 (two) times daily.   potassium chloride SA 20 MEQ tablet Commonly known as: KLOR-CON Take 1 tablet (20 mEq total) by mouth daily.   senna-docusate 8.6-50 MG tablet Commonly known as: Senokot-S Take  1 tablet by mouth at bedtime.   terazosin 5 MG capsule Commonly known as: HYTRIN Take 1 capsule (5 mg total) by mouth at bedtime.   traMADol 50 MG tablet Commonly known as: ULTRAM Take 1 tablet (50 mg total) by mouth every  6 (six) hours as needed for moderate pain.   traZODone 50 MG tablet Commonly known as: DESYREL Take 1 tablet (50 mg total) by mouth at bedtime as needed for sleep. What changed: when to take this   Vitamin D3 50 MCG (2000 UT) Tabs Take 2,000 Units by mouth daily.         ALLERGIES: Allergies  Allergen Reactions   Simvastatin Other (See Comments)    SEVERE MYALGIAS    Zetia [Ezetimibe] Other (See Comments)    MYALGIAS   Dilaudid [Hydromorphone Hcl] Other (See Comments)    hallucination     REVIEW OF SYSTEMS: A comprehensive ROS was conducted with the patient and is negative except as per HPI and below:  Review of Systems  Gastrointestinal:  Negative for diarrhea and nausea.  Genitourinary:  Positive for frequency.     OBJECTIVE:   VITAL SIGNS: BP (!) 124/55 (BP Location: Right Arm, Cuff Size: Normal)   Pulse 77   Temp 98.4 F (36.9 C) (Oral)   Resp 12   SpO2 100%    PHYSICAL EXAM:  General: Pt appears well and is in NAD  Neck: General: Supple without adenopathy or carotid bruits. Thyroid: Thyroid size normal.  No goiter or nodules appreciated.   Lungs: Clear with good BS bilat with no rales, rhonchi, or wheezes  Heart: RRR with normal S1 and S2 and no gallops; no murmurs; no rub  Abdomen: Normoactive bowel sounds, soft, nontender, without masses or organomegaly palpable  Extremities:  Lower extremities - No pretibial edema. No lesions.  Neuro: MS is good with appropriate affect, pt is alert and Ox3    DM foot exam: 03/24/2021  Left BKA  The skin of the feet is intact without sores or ulcerations. The pedal pulse undetectable on the left  The sensation is decreased    DATA REVIEWED:  Lab Results  Component Value Date   HGBA1C 7.5 (H) 02/01/2021   HGBA1C 7.7 (H) 12/12/2020   HGBA1C 6.6 (H) 04/10/2019  Results for Keith Espinoza, Keith Espinoza (MRN [id number]) as of 03/25/2021 16:23  Ref. Range 03/24/2021 15:51  Sodium Latest Ref Range: 134 - 144 mmol/L 145 (H)   Potassium Latest Ref Range: 3.5 - 5.2 mmol/L 4.3  Chloride Latest Ref Range: 96 - 106 mmol/L 104  CO2 Latest Ref Range: 20 - 29 mmol/L 25  Glucose Latest Ref Range: 65 - 99 mg/dL 125 (H)  BUN Latest Ref Range: 8 - 27 mg/dL 15  Creatinine Latest Ref Range: 0.76 - 1.27 mg/dL 0.86  Calcium Latest Ref Range: 8.6 - 10.2 mg/dL 9.7  BUN/Creatinine Ratio Latest Ref Range: 10 - 24  17  eGFR Latest Ref Range: >59 mL/min/1.73 88       Results for Keith Espinoza, Keith Espinoza (MRN [id number]) as of 03/24/2021 13:34  Ref. Range 02/03/2021 03:13  Sodium Latest Ref Range: 135 - 145 mmol/L 137  Potassium Latest Ref Range: 3.5 - 5.1 mmol/L 2.8 (L)  Chloride Latest Ref Range: 98 - 111 mmol/L 96 (L)  CO2 Latest Ref Range: 22 - 32 mmol/L 33 (H)  Glucose Latest Ref Range: 70 - 99 mg/dL 70  BUN Latest Ref Range: 8 - 23 mg/dL 20  Creatinine Latest Ref Range: 0.61 -  1.24 mg/dL 0.90  Calcium Latest Ref Range: 8.9 - 10.3 mg/dL 8.6 (L)  Anion gap Latest Ref Range: 5 - 15  8  GFR, Estimated Latest Ref Range: >60 mL/min >60     CT Abdomen 01/31/2021 Lower chest: No acute pleural or parenchymal lung disease.   Hepatobiliary: No focal liver abnormality is seen. Status post cholecystectomy. No biliary dilatation.   Pancreas: Unremarkable. No pancreatic ductal dilatation or surrounding inflammatory changes.   Spleen: Normal in size without focal abnormality.   Adrenals/Urinary Tract: 10 mm indeterminate left adrenal nodule may reflect adenoma. Right adrenals unremarkable.   There are punctate bilateral nonobstructing renal calculi, measuring up to 5 mm on the right and 3 mm on the left. No obstructive uropathy within either kidney. There is bilateral renal cortical thinning and scarring. The bladder is unremarkable.   Stomach/Bowel: No bowel obstruction or ileus. There is a large amount of fecal retention throughout the colon. Significant stool within the rectal vault consistent with fecal impaction. No bowel wall  thickening or inflammatory change.   Vascular/Lymphatic: Aortic atherosclerosis. No enlarged abdominal or pelvic lymph nodes.   Reproductive: Prostate is unremarkable.   Other: There is no free fluid or free gas. No abdominal wall hernia.   Musculoskeletal: Stable right hip arthroplasty. No acute or destructive bony lesions. Reconstructed images demonstrate no additional findings.   IMPRESSION: 1. Large amount of retained stool throughout the colon consistent with constipation. Large amount of stool within the rectal vault consistent with fecal impaction. 2. Punctate bilateral nonobstructing renal calculi. 3. Indeterminate 10 mm left adrenal nodule, likely adenoma. 4.  Aortic Atherosclerosis (ICD10-I70.0).     ASSESSMENT / PLAN / RECOMMENDATIONS:   1) Type 2 Diabetes Mellitus, Optimally controlled, With neuropathic and macrovascular  complications - Most recent A1c of 7.5 %. Goal A1c < 7.5 %.    -I have advised the patient to bring his meter during future visits. -His A1c is acceptable and no changes will be made today -We did discuss the pharmacokinetics of basal insulin, the patient tends to adjust his basal insulin based on his glucose reading at the time of intake, I have discouraged the patient from this practice and we discussed the difference between long-acting as well as short acting, patient advised to stay on the same dose of Levemir and if he has any concerns about the dosing to reach out to our office -I am going to decrease his Levemir, and monitor his glucose readings  MEDICATIONS: Decrease Levemir to 8 units daily  - Continue Metformin 1000 mg Twice a day  - Continue Jardiance 25 mg daily   EDUCATION / INSTRUCTIONS: BG monitoring instructions: Patient is instructed to check his blood sugars 2 times a day, fasting and bedtime Call [place] [facility] if: BG persistently < 70  I reviewed the Rule of 15 for the treatment of hypoglycemia in detail with the  patient. Literature supplied.   2) Diabetic complications:  Eye: Unknown to have  diabetic retinopathy.  Neuro/ Feet: Does  have known diabetic peripheral neuropathy. Renal: Patient does not have known baseline CKD. He is  on an ACEI/ARB at present.  3) Left Adrenal Adenoma:   - Eighty five percent of adrenal adenomas are nonsecretory.  - Three forms of adrenal hyperfunction should be considered in patients with adrenal incidentaloma  Glucocorticoid hypersecretion Primary hyperaldosteronism Pheochromocytoma  -We will proceed with Aldo, renin, cortisol and catecholamine checks   Follow-up in 3 months   Signed electronically by: Elenora Gamma  Kelton Pillar, MD  [facility]  [facility] [address] [place], [place] [address] Phone: [phone] FAX: [phone]   CC: [name], MD [address] [place] [address] Phone: [phone]  Fax: [phone]    Return to Endocrinology clinic as below: Future Appointments  Date Time Provider [place]  03/27/2021 10:30 AM Valente David, RN [facility] None  04/07/2021  4:15 PM [name], MD [facility] None

## 2021-03-24 NOTE — Patient Instructions (Addendum)
-   Decrease Levemir to 8 units daily  - Continue Metformin 1000 mg Twice a day  - Continue Jardiance 25 mg daily    HOW TO TREAT LOW BLOOD SUGARS (Blood sugar LESS THAN 70 MG/DL) Please follow the RULE OF 15 for the treatment of hypoglycemia treatment (when your (blood sugars are less than 70 mg/dL)   STEP 1: Take 15 grams of carbohydrates when your blood sugar is low, which includes:  3-4 GLUCOSE TABS  OR 3-4 OZ OF JUICE OR REGULAR SODA OR ONE TUBE OF GLUCOSE GEL    STEP 2: RECHECK blood sugar in 15 MINUTES STEP 3: If your blood sugar is still low at the 15 minute recheck --> then, go back to STEP 1 and treat AGAIN with another 15 grams of carbohydrates.    24-Hour Urine Collection  You will be collecting your urine for a 24-hour period of time. Your timer starts with your first urine of the morning (For example - If you first pee at [place], your timer will start at [place]) [place] away your first urine of the morning Collect your urine every time you pee for the next 24 hours STOP your urine collection 24 hours after you started the collection (For example - You would stop at 9AM the day after you started)

## 2021-03-27 ENCOUNTER — Other Ambulatory Visit: Payer: Self-pay | Admitting: *Deleted

## 2021-03-27 NOTE — Patient Outreach (Signed)
[facility]) Care Management  03/27/2021  Keith Espinoza August 25, 1941 [id number]   Outgoing call placed to member, state he is progressing well. Still has daughter in the area for support and assistance. Denies any urgent concerns, encouraged to contact this care manager with questions.  Agrees to follow up within the next month.   Goals Addressed             This Visit's Progress    ( [facility])Monitor and Manage My Blood Sugar   On track    Timeframe:  Long-Range Goal Priority:  Medium Start Date:  [id number]                        Expected End Date:  [id number]                    Follow Up Date [id number]   - check blood sugar at prescribed times - check blood sugar if I feel it is too high or too low - enter blood sugar readings and medication or insulin into daily log - take the blood sugar log to all doctor visits - take the blood sugar meter to all doctor visits    Why is this important?   Checking your blood sugar at home helps to keep it from getting very high or very low.  Writing the results in a diary or log helps the doctor know how to care for you.  Your blood sugar log should have the time, date and the results.  Also, write down the amount of insulin or other medicine that you take.  Other information, like what you ate, exercise done and how you were feeling, will also be helpful.     Notes:   4/19 - Encouraged to check blood sugars daily  5/10 - Reminded of importance of managing daily blood sugars are with wound healing.  Unable to report blood sugar reading for today.  5/25 - report blood sugars are still slightly elevated, today was 294.  Has appointment with endocrinology on 6/21  6/24 - Blood sugars are trending down although today's reading was 230.  Insulin was decreased but has since increased it again to compensate the elevated blood sugars.  He will make provider aware      [facility]) Make and Keep All Appointments   On track    Timeframe:   Long-Range Goal Priority:  Medium Start Date:   [id number]                          Expected End Date:   [id number]                   Follow Up Date [id number]   Barriers: Knowledge  - call to cancel if needed - keep a calendar with prescription refill dates - keep a calendar with appointment dates    Why is this important?   Part of staying healthy is seeing the doctor for follow-up care.  If you forget your appointments, there are some things you can do to stay on track.    Notes:  Need to schedule exam  4/19 - Recently discharged from readmission - Reviewed follow up scheduled with member, had visit with ortho on yesterday, repeat next week  5/10 - Follow up with PCP completed yesterday.  Cardiology, vascular, and ortho appointments all scheduled for 5/18  5/25 - Confirms all 5/18 appointments were attended,  will follow up with ortho on 6/7  6/24 - Followed up with ortho on 6/7, next visit scheduled for 7/5.  Daughter will provide transportation      ([facility])Perform Foot Care   On track    Timeframe:  Long-Range Goal Priority:  Medium Start Date:  [id number]                           Expected End Date:     [id number]                 Follow Up Date [id number]  Barriers: Health Behaviors Knowledge    - check feet daily for cuts, sores or redness - keep feet up while sitting - trim toenails straight across - wash and dry feet carefully every day - wear comfortable, cotton socks - wear comfortable, well-fitting shoes    Why is this important?   Good foot care is very important when you have diabetes.  There are many things you can do to keep your feet healthy and catch a problem early.    Notes:  Patient has ulcers on toes. Great toe has lost its toenail and is now draining [id number] foot is healing some  4/19 - Patient had right BKA, admitted for revision, discussed stump care and follow up with MD  5/10 - Member report staples removed, now has a few stitches with minimal  drainage.  Plan is to fit for prosthetic once wound is fully healed.  Remains active with home health for nursing and PT/OT  5/25 - Report wound on stump healing, no issues with remaining foot  6/24 - Report stump continues to heal, still have small area where he is washing with soap and water twice a day.  Hoping to be able to be fit for prosthetic within the next couple weeks      (THN)[facility]N Set My Target A1C   On track    Timeframe:  Long-Range Goal Priority:  High Start Date:  [id number]                           Expected End Date:    [id number]                  Follow Up Date [id number]   Barriers: Knowledge  - set target A1C    Why is this important?   Your target A1C is decided together by you and your doctor.  It is based on several things like your age and other health issues.    Notes: 7.0  4/19 - Patient aware of proper diet to manage A1C as well as adherence to medication regime  5/10 - Education sent on managing diabetes and decreasing A1C  5/25 - Understand importance of managing diabetes in relation to wound care  6/24 - Report seen by endocrinology on 6/21, will revisit on 9/27.  Medication changes made, will continue to monitor blood sugars        Valente David, RN, MSN [place] [phone]

## 2021-03-30 ENCOUNTER — Other Ambulatory Visit: Payer: Self-pay

## 2021-03-30 ENCOUNTER — Other Ambulatory Visit: Payer: Medicare Other

## 2021-03-30 DIAGNOSIS — D3502 Benign neoplasm of left adrenal gland: Secondary | ICD-10-CM | POA: Diagnosis not present

## 2021-04-01 LAB — CREATININE, URINE, 24 HOUR
Creatinine, 24H Ur: 575 mg/24 hr — ABNORMAL LOW (ref 1000–2000)
Creatinine, Urine: 33.8 mg/dL

## 2021-04-02 DIAGNOSIS — Z23 Encounter for immunization: Secondary | ICD-10-CM | POA: Diagnosis not present

## 2021-04-02 LAB — BASIC METABOLIC PANEL
BUN/Creatinine Ratio: 17 (ref 10–24)
BUN: 15 mg/dL (ref 8–27)
CO2: 25 mmol/L (ref 20–29)
Calcium: 9.7 mg/dL (ref 8.6–10.2)
Chloride: 104 mmol/L (ref 96–106)
Creatinine, Ser: 0.86 mg/dL (ref 0.76–1.27)
Glucose: 125 mg/dL — ABNORMAL HIGH (ref 65–99)
Potassium: 4.3 mmol/L (ref 3.5–5.2)
Sodium: 145 mmol/L — ABNORMAL HIGH (ref 134–144)
eGFR: 88 mL/min/{1.73_m2} (ref >59)

## 2021-04-02 LAB — ALDOSTERONE + RENIN ACTIVITY W/ RATIO
ALDOS/RENIN RATIO: 1.8 (ref 0.0–30.0)
ALDOSTERONE: 1.8 ng/dL (ref 0.0–30.0)
Renin: 1.007 ng/mL/hr (ref 0.167–5.380)

## 2021-04-07 ENCOUNTER — Encounter: Payer: Self-pay | Admitting: Orthopedic Surgery

## 2021-04-07 ENCOUNTER — Ambulatory Visit (INDEPENDENT_AMBULATORY_CARE_PROVIDER_SITE_OTHER): Payer: Medicare Other | Admitting: Physician Assistant

## 2021-04-07 ENCOUNTER — Telehealth: Payer: Self-pay | Admitting: Orthopedic Surgery

## 2021-04-07 DIAGNOSIS — Z89511 Acquired absence of right leg below knee: Secondary | ICD-10-CM

## 2021-04-07 NOTE — Telephone Encounter (Signed)
03/10/21 ov note faxed [facility] [phone]
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
## 2021-04-08 LAB — CATECHOLAMINES, FRACTIONATED, URINE, 24 HOUR
Dopamine , 24H Ur: 77 ug/24 hr (ref 0–510)
Dopamine, Rand Ur: 45 ug/L
Epinephrine, 24H Ur: 3 ug/24 hr (ref 0–20)
Epinephrine, Rand Ur: 2 ug/L
Norepinephrine, 24H Ur: 34 ug/24 hr (ref 0–135)
Norepinephrine, Rand Ur: 20 ug/L

## 2021-04-09 LAB — CORTISOL, URINE, FREE
Cortisol (Ur), Free: 22 ug/24 hr (ref 5–64)
Cortisol,F,ug/L,U: 13 ug/L

## 2021-04-10 ENCOUNTER — Ambulatory Visit (INDEPENDENT_AMBULATORY_CARE_PROVIDER_SITE_OTHER): Payer: Medicare Other | Admitting: Family

## 2021-04-10 ENCOUNTER — Encounter: Payer: Self-pay | Admitting: Family

## 2021-04-10 ENCOUNTER — Encounter: Payer: Self-pay | Admitting: Internal Medicine

## 2021-04-10 DIAGNOSIS — L03115 Cellulitis of right lower limb: Secondary | ICD-10-CM

## 2021-04-10 DIAGNOSIS — L97909 Non-pressure chronic ulcer of unspecified part of unspecified lower leg with unspecified severity: Secondary | ICD-10-CM

## 2021-04-10 DIAGNOSIS — I70299 Other atherosclerosis of native arteries of extremities, unspecified extremity: Secondary | ICD-10-CM | POA: Diagnosis not present
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
## 2021-04-22 ENCOUNTER — Other Ambulatory Visit: Payer: Self-pay | Admitting: *Deleted

## 2021-04-22 NOTE — Patient Outreach (Signed)
[name]) Care Management  04/22/2021  Keith Espinoza 09/01/1941 [id number]   Outgoing call placed to member, no answer.  HIPAA compliant voice message left on listed home number.  Unable to leave message on listed mobile number as voice mail has not been set up.  Will follow up within the next 3-4 business days.  Valente David, [place], MSN [place] [phone]

## 2021-04-27 ENCOUNTER — Other Ambulatory Visit: Payer: Self-pay | Admitting: *Deleted

## 2021-04-27 NOTE — Patient Outreach (Signed)
[facility]) Care Management  [place]  04/28/2021   Keith Espinoza [birth year] [id number]   Outreach  attempt #2, successful.  Report he is doing well, denies any ongoing pain or discomfort, nor complications following amputation.  Denies any urgent concerns, encouraged to contact this care manager with questions.    Encounter Medications:  Outpatient Encounter Medications as of 04/27/2021  Medication Sig Note   acetaminophen (TYLENOL) 500 MG tablet Take 1,000 mg by mouth every 6 (six) hours as needed (pain).    allopurinol (ZYLOPRIM) 300 MG tablet Take 300 mg by mouth daily.    amLODipine (NORVASC) 5 MG tablet Take 1 tablet (5 mg total) by mouth daily.    aspirin EC 81 MG tablet Take 81 mg by mouth daily.    Cholecalciferol (VITAMIN D3) 50 MCG (2000 UT) TABS Take 2,000 Units by mouth daily.     clopidogrel (PLAVIX) 75 MG tablet Take 1 tablet (75 mg total) by mouth daily.    Continuous Blood Gluc Receiver (DEXCOM G6 RECEIVER) DEVI 1 Device by Does not apply route as directed.    Continuous Blood Gluc Sensor (DEXCOM G6 SENSOR) MISC 1 Device by Does not apply route as directed.    Continuous Blood Gluc Transmit (DEXCOM G6 TRANSMITTER) MISC 1 Device by Does not apply route as directed.    Cyanocobalamin (B-12) 2500 MCG TABS Take 2,500 mcg by mouth daily.    empagliflozin (JARDIANCE) 25 MG TABS tablet Take 1 tablet (25 mg total) by mouth daily.    gabapentin (NEURONTIN) 100 MG capsule Take 2 capsules (200 mg total) by mouth at bedtime. (Patient taking differently: Take 600 mg by mouth at bedtime.) 01/10/2021: Bedtime dose is a total of 600 mg per pt   gabapentin (NEURONTIN) 400 MG capsule Take 1 capsule (400 mg total) by mouth 3 (three) times daily. (Patient taking differently: Take 400 mg by mouth daily.) 01/10/2021: Bedtime dose is a total of 600 mg per pt   hydrochlorothiazide (HYDRODIURIL) 12.5 MG tablet Take 1 tablet (12.5 mg total) by mouth daily.    insulin  detemir (LEVEMIR FLEXTOUCH) 100 UNIT/ML FlexPen Inject 8 Units into the skin daily. Dose is based on CBG    Insulin Pen Needle 32G X 4 MM MISC 1 Device by Does not apply route daily in the afternoon.    isosorbide mononitrate (IMDUR) 30 MG 24 hr tablet Take 1 tablet (30 mg total) by mouth daily. Please schedule yearly appointment for future refills. Thank you    linaclotide (LINZESS) 145 MCG CAPS capsule Take 1 capsule (145 mcg total) by mouth daily before breakfast.    losartan (COZAAR) 50 MG tablet Take 1 tablet (50 mg total) by mouth daily.    metFORMIN (GLUCOPHAGE) 1000 MG tablet Take 1 tablet (1,000 mg total) by mouth 2 (two) times daily.    methocarbamol (ROBAXIN) 500 MG tablet Take 1 tablet (500 mg total) by mouth every 6 (six) hours as needed for muscle spasms.    nitroGLYCERIN (NITROSTAT) 0.4 MG SL tablet Place 0.4 mg under the tongue every 5 (five) minutes x 3 doses as needed for chest pain.     ONE TOUCH ULTRA TEST test strip CHECK BLOOD SUGAR ONCE DAILY AS DIRECTED    oxyCODONE (OXY IR/ROXICODONE) 5 MG immediate release tablet Take 1 tablet (5 mg total) by mouth every 4 (four) hours as needed for severe pain.    polyethylene glycol (MIRALAX) 17 g packet Take 17 g by mouth 2 (two)  times daily.    potassium chloride SA (KLOR-CON) 20 MEQ tablet Take 1 tablet (20 mEq total) by mouth daily.    senna-docusate (SENOKOT-S) 8.6-50 MG tablet Take 1 tablet by mouth at bedtime.    terazosin (HYTRIN) 5 MG capsule Take 1 capsule (5 mg total) by mouth at bedtime.    traMADol (ULTRAM) 50 MG tablet Take 1 tablet (50 mg total) by mouth every 6 (six) hours as needed for moderate pain.    traZODone (DESYREL) 50 MG tablet Take 1 tablet (50 mg total) by mouth at bedtime as needed for sleep. (Patient taking differently: Take 50 mg by mouth 2 (two) times daily.)    No facility-administered encounter medications on file as of 04/27/2021.    Functional Status:  In your present state of health, do you have any  difficulty performing the following activities: 02/01/2021 01/09/2021  Hearing? N -  Vision? N -  Difficulty concentrating or making decisions? N -  Walking or climbing stairs? Y -  Dressing or bathing? Y -  Doing errands, shopping? N N  Some recent data might be hidden    Fall/Depression Screening: Fall Risk  01/20/2021 11/24/2020 08/26/2020  Falls in the past year? 0 0 0  Number falls in past yr: 0 0 0  Injury with Fall? 0 0 -  Risk for fall due to : Impaired mobility Impaired mobility;Impaired balance/gait Impaired balance/gait;Impaired mobility  Follow up Education provided Falls evaluation completed Falls evaluation completed;Falls prevention discussed   PHQ 2/9 Scores 01/20/2021 10/12/2019  PHQ - 2 Score 0 0    Assessment:   Care Plan Care Plan : Diabetes Type 2 (Adult)  Updates made by Valente David, RN since 04/28/2021 12:00 AM     Problem: Glycemic Management (Diabetes, Type 2)   Priority: Medium  Onset Date: 08/26/2020     Goal: Glycemic Management Optimized   Start Date: 08/26/2020  Expected End Date: 06/02/2021  This Visit's Progress: On track  Recent Progress: On track  Note:   Evidence-based guidance:  Anticipate A1C testing (point-of-care) every 3 to 6 months based on goal attainment.  Review mutually-set A1C goal or target range.  Anticipate use of antihyperglycemic with or without insulin and periodic adjustments; consider active involvement of pharmacist.  Provide medical nutrition therapy and development of individualized eating.  Compare self-reported symptoms of hypo or hyperglycemia to blood glucose levels, diet and fluid intake, current medications, psychosocial and physiologic stressors, change in activity and barriers to care adherence.  Promote self-monitoring of blood glucose levels.  Assess and address barriers to management plan, such as food insecurity, age, developmental ability, depression, anxiety, fear of hypoglycemia or weight gain, as well as  medication cost, side effects and complicated regimen.  Consider referral to community-based diabetes education program, visiting nurse, community health worker or health coach.  Encourage regular dental care for treatment of periodontal disease; refer to dental provider when needed.   Notes:     Problem: Disease Progression (Diabetes, Type 2)   Priority: Medium  Onset Date: 08/26/2020     Long-Range Goal: Disease Progression Prevented or Minimized   Start Date: 08/26/2020  Expected End Date: 06/02/2021  This Visit's Progress: On track  Recent Progress: On track  Note:   Evidence-based guidance:  Prepare patient for laboratory and diagnostic exams based on risk and presentation.  Encourage lifestyle changes, such as increased intake of plant-based foods, stress reduction, consistent physical activity and smoking cessation to prevent long-term complications and chronic disease.  Individualize activity and exercise recommendations while considering potential limitations, such as neuropathy, retinopathy or the ability to prevent hyperglycemia or hypoglycemia.   Prepare patient for use of pharmacologic therapy that may include antihypertensive, analgesic, prostaglandin E1 with periodic adjustments, based on presenting chronic condition and laboratory results.  Assess signs/symptoms and risk factors for hypertension, sleep-disordered breathing, neuropathy (including changes in gait and balance), retinopathy, nephropathy and sexual dysfunction.  Address pregnancy planning and contraceptive choice, especially when prescribing antihypertensive or statin.  Ensure completion of annual comprehensive foot exam and dilated eye exam.   Implement additional individualized goals and interventions based on identified risk factors.  Prepare patient for consultation or referral for specialist care, such as ophthalmology, neurology, cardiology, podiatry, nephrology or perinatology.   Notes:       Goals  Addressed             This Visit's Progress    ( THN)Monitor and Manage My Blood Sugar   On track    Timeframe:  Long-Range Goal Priority:  Medium Start Date:  [id number]                        Expected End Date:  [id number]                    Follow Up Date [id number]   - check blood sugar at prescribed times - check blood sugar if I feel it is too high or too low - enter blood sugar readings and medication or insulin into daily log - take the blood sugar log to all doctor visits - take the blood sugar meter to all doctor visits    Why is this important?   Checking your blood sugar at home helps to keep it from getting very high or very low.  Writing the results in a diary or log helps the doctor know how to care for you.  Your blood sugar log should have the time, date and the results.  Also, write down the amount of insulin or other medicine that you take.  Other information, like what you ate, exercise done and how you were feeling, will also be helpful.     Notes:   4/19 - Encouraged to check blood sugars daily  5/10 - Reminded of importance of managing daily blood sugars are with wound healing.  Unable to report blood sugar reading for today.  5/25 - report blood sugars are still slightly elevated, today was 294.  Has appointment with endocrinology on 6/21  6/24 - Blood sugars are trending down although today's reading was 230.  Insulin was decreased but has since increased it again to compensate the elevated blood sugars.  He will make provider aware  7/25 - Report blood sugars are better, range 150-170 over the last few weeks     [facility]) Make and Keep All Appointments   On track    Timeframe:  Long-Range Goal Priority:  Medium Start Date:   [id number]                          Expected End Date:   [id number]                   Follow Up Date [id number]   Barriers: Knowledge  - call to cancel if needed - keep a calendar with prescription refill dates - keep a calendar with  appointment dates    Why is  this important?   Part of staying healthy is seeing the doctor for follow-up care.  If you forget your appointments, there are some things you can do to stay on track.    Notes:  Need to schedule exam  4/19 - Recently discharged from readmission - Reviewed follow up scheduled with member, had visit with ortho on yesterday, repeat next week  5/10 - Follow up with PCP completed yesterday.  Cardiology, vascular, and ortho appointments all scheduled for 5/18  5/25 - Confirms all 5/18 appointments were attended, will follow up with ortho on 6/7  6/24 - Followed up with ortho on 6/7, next visit scheduled for 7/5.  Daughter will provide transportation  7/25 - Report appointment with ortho on 8/2 to receive prosthetic, will have PT evaluation on 8/3     (THN)Perform Foot Care   On track    Timeframe:  Long-Range Goal Priority:  Medium Start Date:  [id number]                           Expected End Date:     [id number]                 Follow Up Date [id number]  Barriers: Health Behaviors Knowledge    - check feet daily for cuts, sores or redness - keep feet up while sitting - trim toenails straight across - wash and dry feet carefully every day - wear comfortable, cotton socks - wear comfortable, well-fitting shoes    Why is this important?   Good foot care is very important when you have diabetes.  There are many things you can do to keep your feet healthy and catch a problem early.    Notes:  Patient has ulcers on toes. Great toe has lost its toenail and is now draining [id number] foot is healing some  4/19 - Patient had right BKA, admitted for revision, discussed stump care and follow up with MD  5/10 - Member report staples removed, now has a few stitches with minimal drainage.  Plan is to fit for prosthetic once wound is fully healed.  Remains active with home health for nursing and PT/OT  5/25 - Report wound on stump healing, no issues with remaining  foot  6/24 - Report stump continues to heal, still have small area where he is washing with soap and water twice a day.  Hoping to be able to be fit for prosthetic within the next couple weeks  7/25 - State wound has healed, providers feel his is ready for prosthetic     (THN)THN Set My Target A1C   On track    Timeframe:  Long-Range Goal Priority:  High Start Date:  [id number]                           Expected End Date:    [id number]                  Follow Up Date [id number]   Barriers: Knowledge  - set target A1C    Why is this important?   Your target A1C is decided together by you and your doctor.  It is based on several things like your age and other health issues.    Notes: 7.0  4/19 - Patient aware of proper diet to manage A1C as well as adherence to medication regime  5/10 - Education sent on managing diabetes and  decreasing A1C  5/25 - Understand importance of managing diabetes in relation to wound care  6/24 - Report seen by endocrinology on 6/21, will revisit on 9/27.  Medication changes made, will continue to monitor blood sugars  7/25 - A1C currently 7/5, report understanding of DM management in effort to decrease to goal        Plan:  Follow-up: Patient agrees to Care Plan and Follow-up. Follow-up in 1 month(s).    Valente David, [place], MSN [place] [phone]

## 2021-04-30 DIAGNOSIS — E785 Hyperlipidemia, unspecified: Secondary | ICD-10-CM | POA: Diagnosis not present

## 2021-04-30 DIAGNOSIS — E1165 Type 2 diabetes mellitus with hyperglycemia: Secondary | ICD-10-CM | POA: Diagnosis not present

## 2021-04-30 DIAGNOSIS — E1140 Type 2 diabetes mellitus with diabetic neuropathy, unspecified: Secondary | ICD-10-CM | POA: Diagnosis not present

## 2021-04-30 DIAGNOSIS — N401 Enlarged prostate with lower urinary tract symptoms: Secondary | ICD-10-CM | POA: Diagnosis not present

## 2021-04-30 DIAGNOSIS — K219 Gastro-esophageal reflux disease without esophagitis: Secondary | ICD-10-CM | POA: Diagnosis not present

## 2021-04-30 DIAGNOSIS — N400 Enlarged prostate without lower urinary tract symptoms: Secondary | ICD-10-CM | POA: Diagnosis not present

## 2021-04-30 DIAGNOSIS — I25118 Atherosclerotic heart disease of native coronary artery with other forms of angina pectoris: Secondary | ICD-10-CM | POA: Diagnosis not present

## 2021-04-30 DIAGNOSIS — E1342 Other specified diabetes mellitus with diabetic polyneuropathy: Secondary | ICD-10-CM | POA: Diagnosis not present

## 2021-04-30 DIAGNOSIS — I10 Essential (primary) hypertension: Secondary | ICD-10-CM | POA: Diagnosis not present

## 2021-04-30 DIAGNOSIS — E1151 Type 2 diabetes mellitus with diabetic peripheral angiopathy without gangrene: Secondary | ICD-10-CM | POA: Diagnosis not present

## 2021-05-05 ENCOUNTER — Encounter: Payer: Self-pay | Admitting: Orthopedic Surgery

## 2021-05-05 ENCOUNTER — Ambulatory Visit (INDEPENDENT_AMBULATORY_CARE_PROVIDER_SITE_OTHER): Payer: Medicare Other | Admitting: Orthopedic Surgery

## 2021-05-05 DIAGNOSIS — Z89511 Acquired absence of right leg below knee: Secondary | ICD-10-CM | POA: Diagnosis not present

## 2021-05-05 DIAGNOSIS — I70299 Other atherosclerosis of native arteries of extremities, unspecified extremity: Secondary | ICD-10-CM

## 2021-05-05 DIAGNOSIS — L97909 Non-pressure chronic ulcer of unspecified part of unspecified lower leg with unspecified severity: Secondary | ICD-10-CM

## 2021-05-05 NOTE — Progress Notes (Signed)
Office Visit Note   Patient: Keith Espinoza           Date of Birth: December 30, 1940           MRN: [id number] Visit Date: 05/05/2021              Requested by: [name], MD [address] [place],  [place] [address] PCP: [name], MD  Chief Complaint  Patient presents with   Right Leg - Follow-up      HPI: Patient is a 80 year old gentleman who is 4 months status post right transtibial amputation.  Patient is getting fitted for his prosthesis today and has a gait training appointment with Shirlean Mylar tomorrow.  Assessment & Plan: Visit Diagnoses:  1. Right below-knee amputee [facility])     Plan: Slowly increase the amount of time he wears his prosthesis.  Call if there is any change to the scabbed area over the tibial tubercle.  Follow-Up Instructions: Return in about 3 months (around 08/05/2021).   Ortho Exam  Patient is alert, oriented, no adenopathy, well-dressed, normal affect, normal respiratory effort. Examination the transtibial amputation has healed well there is no redness no cellulitis no open wounds no signs of infection.  He does have a 1 cm scab over the tibial tubercle there is no surrounding cellulitis this is tender to palpation he states that with wearing the socket it does not put pressure on this area.  Imaging: No results found. No images are attached to the encounter.  Labs: Lab Results  Component Value Date   HGBA1C 7.5 (H) 02/01/2021   HGBA1C 7.7 (H) 12/12/2020   HGBA1C 6.6 (H) 04/10/2019   REPTSTATUS 04/18/2019 FINAL 04/13/2019   GRAMSTAIN  04/13/2019    RARE WBC PRESENT, PREDOMINANTLY PMN NO ORGANISMS SEEN    CULT  04/13/2019    No growth aerobically or anaerobically. Performed at [facility], [place] [address] [place], [place] [address]      Lab Results  Component Value Date   ALBUMIN 3.5 01/31/2021   ALBUMIN 2.9 (L) 12/17/2020   ALBUMIN 4.4 04/10/2019    Lab Results  Component Value Date   MG 2.7 (H) 02/01/2021    No results found for: VD25OH  No results found for: PREALBUMIN CBC EXTENDED Latest Ref Rng & Units 02/03/2021 02/01/2021 01/31/2021  WBC 4.0 - 10.5 K/uL 6.2 9.3 11.6(H)  RBC 4.22 - 5.81 MIL/uL 3.36(L) 3.59(L) 3.88(L)  HGB 13.0 - 17.0 g/dL 9.9(L) 10.6(L) 11.2(L)  HCT 39.0 - 52.0 % 28.9(L) 32.1(L) 35.9(L)  PLT 150 - 400 K/uL 152 171 187  NEUTROABS 1.7 - 7.7 K/uL - - -  LYMPHSABS 0.7 - 4.0 K/uL - - -     There is no height or weight on file to calculate BMI.  Orders:  No orders of the defined types were placed in this encounter.  No orders of the defined types were placed in this encounter.    Procedures: No procedures performed  Clinical Data: No additional findings.  ROS:  All other systems negative, except as noted in the HPI. Review of Systems  Objective: Vital Signs: There were no vitals taken for this visit.  Specialty Comments:  No specialty comments available.  PMFS History: Patient Active Problem List   Diagnosis Date Noted   Type 2 diabetes mellitus with diabetic polyneuropathy, with long-term current use of insulin ([place]) 03/24/2021   Hypokalemia    Constipation 02/02/2021   Fecal impaction ([place]) 02/01/2021   Wound  dehiscence 01/09/2021   Dehiscence of amputation stump ([place])    Status post percutaneous transluminal coronary angioplasty 01/06/2021   Type II diabetes mellitus, uncontrolled ([place]) 01/06/2021   Acute pancreatitis 01/06/2021   Diabetic peripheral vascular disease ([place]) 01/06/2021   Encounter for screening for other disorder 01/06/2021   Enlarged prostate 01/06/2021   Foot ulcer, right ([place]) 01/06/2021   Gout 01/06/2021   Headache 01/06/2021   Neck pain 01/06/2021   Hypoglycemia 01/06/2021   Loss of appetite 01/06/2021   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   Peripheral neuropathy 01/06/2021   Sciatica 01/06/2021   Vitamin B12 deficiency 01/06/2021   Vitamin D deficiency 01/06/2021   Weakness 01/06/2021   Diabetes mellitus type 2  with neurological manifestations ([place]) 01/06/2021   Protein-calorie malnutrition, severe 12/26/2020   Acute blood loss anemia 12/26/2020   Prerenal azotemia 12/26/2020   Below-knee amputation of right lower extremity ([place]) 12/12/2020   Gangrene of right foot ([place])    Diabetic neuropathy ([place]) 11/17/2020   Hyperglycemia due to type 2 diabetes mellitus ([place]) 11/17/2020   Long term (current) use of insulin ([place]) 11/17/2020   Obesity 11/17/2020   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/2019   Hip dislocation, right (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 03/16/2017   S/P total knee arthroplasty, left 10/26/2016   Hyperlipidemia 09/04/2014   Thrombocytopenia (H[facility])    Precordial chest pain 04/05/2014   Coronary atherosclerosis of native coronary artery 10/01/2013   Other and unspecified hyperlipidemia 10/01/2013   Primary hypertension 10/01/2013   Diabetes mellitus ([place]) 10/01/2013   Esophageal reflux 10/01/2013   Hypertrophy of prostate without urinary obstruction and other lower urinary tract symptoms (LUTS) 10/01/2013   Past Medical History:  Diagnosis Date   Allergic rhinitis    Allergic rhinitis    Arthritis    Basal cell carcinoma 11/01/2019    bcc left chest treatment TX [id number] [id number]    Chronic leg pain    right   Chronic lower back pain    Coronary artery disease    a. Stenting to RCA 2004; staged DES to LAD and Cx 2004. DES to mRCA 2012. b. DES to mCx, PTCA to dCx 11/2011. c. Lateral wall MI 2013 s/p PTCA to distal Cx & DES to mid OM2 11/2011. d. Low risk nuc 04/2014, EF wnl.   COVID-19    Diabetes mellitus    Insulin dependent   Diabetic neuropathy ([place]    MILD   Diverticulosis    Dysrhythmia    GRosanna Randy syndrome    Gout    right wrist; right foot; right elbow; have had it since 1970's   H/O hiatal hernia    Heart murmur    History of echocardiogram    aortic sclerosis per echo 12/09 EF 65%, otherwise normal   History of hemorrhoids    BLEEDING   History of kidney stones    h/o   Hypertension    Diagnosed 1995    Myocardial infarction ([facility]    Pancreatic pseudocyst    a. s/p remote drainage 2006.   Thrombocytopenia ([place]    Seen on oldest labs in system from 2004   Vitamin B 12 deficiency    orally replaced    Family History  Problem Relation  Age of Onset   Diabetes Mother    Hyperlipidemia Mother    Hypertension Mother    Cancer Father    Hypertension Father    Diabetes Sister    Hypertension Sister    Cancer Brother    Heart attack Neg Hx     Past Surgical History:  Procedure Laterality Date   ABDOMINAL AORTOGRAM W/LOWER EXTREMITY Bilateral 08/08/2020   Procedure: ABDOMINAL AORTOGRAM W/LOWER EXTREMITY;  Surgeon: Angelia Mould, MD;  Location: [place] [facility] LAB;  Service: Cardiovascular;  Laterality: Bilateral;   AMPUTATION Right 12/12/2020   Procedure: RIGHT BELOW KNEE AMPUTATION;  Surgeon: [name], MD;  Location: [facility];  Service: Orthopedics;  Laterality: Right;   BACK SURGERY     "total of 3 times" S/P fall    CARPAL TUNNEL RELEASE Bilateral    CHOLECYSTECTOMY  1990's   COLONOSCOPY     CORONARY ANGIOPLASTY  11/11/11   CORONARY ANGIOPLASTY WITH STENT PLACEMENT  09/30/2011   "1 then; makes a total of 4"   CORONARY ANGIOPLASTY WITH STENT PLACEMENT  11/11/11   "1; makes a total of 5"   INGUINAL HERNIA REPAIR  2003   right   JOINT REPLACEMENT Right 04/03/2002   hip replacment   KNEE ARTHROSCOPY  1990's   left   LEFT HEART CATHETERIZATION WITH CORONARY ANGIOGRAM N/A 09/30/2011   Procedure: LEFT HEART CATHETERIZATION WITH CORONARY ANGIOGRAM;  Surgeon: Jettie Booze, MD;  Location: [facility] CATH LAB;  Service: Cardiovascular;  Laterality: N/A;  possible PCI    LEFT HEART CATHETERIZATION WITH CORONARY ANGIOGRAM N/A 11/15/2011   Procedure: LEFT HEART CATHETERIZATION WITH CORONARY ANGIOGRAM;  Surgeon: Jettie Booze, MD;  Location: [facility] CATH LAB;  Service: Cardiovascular;  Laterality: N/A;   PERCUTANEOUS CORONARY STENT INTERVENTION (PCI-S)  09/30/2011   Procedure: PERCUTANEOUS CORONARY STENT INTERVENTION (PCI-S);  Surgeon: Jettie Booze, MD;  Location[facility] CATH LAB;  Service: Cardiovascular;;   PERCUTANEOUS CORONARY STENT INTERVENTION (PCI-S) N/A 11/11/2011   Procedure: PERCUTANEOUS CORONARY STENT INTERVENTION (PCI-S);  Surgeon: Jettie Booze, MD;  Location: [facility] CATH LAB;  Service: Cardiovascular;  Laterality: N/A;   PERIPHERAL VASCULAR BALLOON ANGIOPLASTY Right 08/08/2020   Procedure: PERIPHERAL VASCULAR BALLOON ANGIOPLASTY;  Surgeon: Angelia Mould, MD;  Location: [facility];  Service: Cardiovascular;  Laterality: Right;  Posterior tibial    SHOULDER SURGERY Right    X 2   STUMP REVISION Right 01/09/2021   Procedure: REVISION RIGHT BELOW KNEE AMPUTATION;  Surgeon: [name], MD;  Location: [facility];  Service: Orthopedics;  Laterality: Right;   TONSILLECTOMY  ~ [place] Right 04/13/2019   Procedure: RIGHT TOTAL HIP REVISION-POSTERIOR  APPROACH LATERAL;  Surgeon: Marybelle Killings, MD;  Location: [facility];  Service: Orthopedics;  Laterality: Right;   TOTAL KNEE ARTHROPLASTY Left 07/23/2016   Procedure: LEFT TOTAL KNEE ARTHROPLASTY;  Surgeon: Marybelle Killings, MD;  Location: [place];  Service: Orthopedics;  Laterality: Left;   Social History   Occupational History   Occupation: Retired  Tobacco Use   Smoking status: Former   Smokeless tobacco: Current    Types: Chew   Tobacco comments:    quit 60 years ago  Vaping Use   Vaping Use: Never used  Substance and Sexual Activity   Alcohol use: No   Drug use: No   Sexual activity: Not Currently

## 2021-05-06 ENCOUNTER — Encounter: Payer: Self-pay | Admitting: Physical Therapy

## 2021-05-06 ENCOUNTER — Other Ambulatory Visit: Payer: Self-pay

## 2021-05-06 ENCOUNTER — Ambulatory Visit (INDEPENDENT_AMBULATORY_CARE_PROVIDER_SITE_OTHER): Payer: Medicare Other | Admitting: Physical Therapy

## 2021-05-06 DIAGNOSIS — M6281 Muscle weakness (generalized): Secondary | ICD-10-CM

## 2021-05-06 DIAGNOSIS — R293 Abnormal posture: Secondary | ICD-10-CM | POA: Diagnosis not present

## 2021-05-06 DIAGNOSIS — R2689 Other abnormalities of gait and mobility: Secondary | ICD-10-CM | POA: Diagnosis not present

## 2021-05-06 DIAGNOSIS — M25661 Stiffness of right knee, not elsewhere classified: Secondary | ICD-10-CM

## 2021-05-06 DIAGNOSIS — R2681 Unsteadiness on feet: Secondary | ICD-10-CM

## 2021-05-06 NOTE — Therapy (Signed)
[facility] [address] [place], [place], [address] Phone: [phone]   Fax:  [phone]  Physical Therapy Treatment  Patient Details  Name: Keith Espinoza MRN: [id number] Date of Birth: May 22, 1941 Referring Provider (PT): Meridee Score, MD   Encounter Date: 05/06/2021   PT End of Session - 05/06/21 1630     Visit Number 1    Number of Visits 25    Date for PT Re-Evaluation 07/30/21    Authorization Type Medicare A&B and [employer]    Progress Note Due on Visit 10    PT Start Time 1015    PT Stop Time 1105    PT Time Calculation (min) 50 min    Equipment Utilized During Treatment Gait belt    Activity Tolerance Patient tolerated treatment well    Behavior During Therapy [facility] for tasks assessed/performed             Past Medical History:  Diagnosis Date   Allergic rhinitis    Allergic rhinitis    Arthritis    Basal cell carcinoma 11/01/2019    bcc left chest treatment [place] [id number]    Chronic leg pain    right   Chronic lower back pain    Coronary artery disease    a. Stenting to RCA 2004; staged DES to LAD and Cx 2004. DES to mRCA 2012. b. DES to mCx, PTCA to dCx 11/2011. c. Lateral wall MI 2013 s/p PTCA to distal Cx & DES to mid OM2 11/2011. d. Low risk nuc 04/2014, EF wnl.   COVID-19    Diabetes mellitus    Insulin dependent   Diabetic neuropathy ([place]    MILD   Diverticulosis    Dysrhythmia    GRosanna Randysyndrome    Gout    right wrist; right foot; right elbow; have had it since 1970's   H/O hiatal hernia    Heart murmur    History of echocardiogram    aortic sclerosis per echo 12/09 EF 65%, otherwise normal   History of hemorrhoids    BLEEDING   History of kidney stones    h/o   Hypertension    Diagnosed 1995    Myocardial infarction ([facility]    Pancreatic pseudocyst    a. s/p remote drainage 2006.   Thrombocytopenia ([place]    Seen on oldest labs in system from 2004   Vitamin B 12 deficiency    orally replaced     Past Surgical History:  Procedure Laterality Date   ABDOMINAL AORTOGRAM W/LOWER EXTREMITY Bilateral 08/08/2020   Procedure: ABDOMINAL AORTOGRAM W/LOWER EXTREMITY;  Surgeon: DAngelia Mould MD;  [place] [facility];  Service: Cardiovascular;  Laterality: Bilateral;   AMPUTATION Right 12/12/2020   Procedure: RIGHT BELOW KNEE AMPUTATION;  Surgeon: DNewt Minion MD;  [place]  Service: Orthopedics;  Laterality: Right;   BACK SURGERY     "total of 3 times" S/P fall    CARPAL TUNNEL RELEASE Bilateral    CHOLECYSTECTOMY  1990's   COLONOSCOPY     CORONARY ANGIOPLASTY  11/11/11   CORONARY ANGIOPLASTY WITH STENT PLACEMENT  09/30/2011   "1 then; makes a total of 4"   CORONARY ANGIOPLASTY WITH STENT PLACEMENT  11/11/11   "1; makes a total of 5"   INGUINAL HERNIA REPAIR  2003   right   JOINT REPLACEMENT Right 04/03/2002   hip replacment   KNEE ARTHROSCOPY  1990's   left   LEFT HEART CATHETERIZATION  WITH CORONARY ANGIOGRAM N/A 09/30/2011   Procedure: LEFT HEART CATHETERIZATION WITH CORONARY ANGIOGRAM;  Surgeon: Jettie Booze, MD;  Location: [facility] CATH LAB;  Service: Cardiovascular;  Laterality: N/A;  possible PCI   LEFT HEART CATHETERIZATION WITH CORONARY ANGIOGRAM N/A 11/15/2011   Procedure: LEFT HEART CATHETERIZATION WITH CORONARY ANGIOGRAM;  Surgeon: Jettie Booze, MD;  Location: [facility] CATH LAB;  Service: Cardiovascular;  Laterality: N/A;   PERCUTANEOUS CORONARY STENT INTERVENTION (PCI-S)  09/30/2011   Procedure: PERCUTANEOUS CORONARY STENT INTERVENTION (PCI-S);  Surgeon: Jettie Booze, MD;  Location: [facility] CATH LAB;  Service: Cardiovascular;;   PERCUTANEOUS CORONARY STENT INTERVENTION (PCI-S) N/A 11/11/2011   Procedure: PERCUTANEOUS CORONARY STENT INTERVENTION (PCI-S);  Surgeon: Jettie Booze, MD;  Location: [facility] CATH LAB;  Service: Cardiovascular;  Laterality: N/A;   PERIPHERAL VASCULAR BALLOON ANGIOPLASTY Right 08/08/2020   Procedure: PERIPHERAL VASCULAR  BALLOON ANGIOPLASTY;  Surgeon: Angelia Mould, MD;  Location: [place] CV LAB;  Service: Cardiovascular;  Laterality: Right;  Posterior tibial    SHOULDER SURGERY Right    X 2   STUMP REVISION Right 01/09/2021   Procedure: REVISION RIGHT BELOW KNEE AMPUTATION;  Surgeon: [name], MD;  Location: [place];  Service: Orthopedics;  Laterality: Right;   TONSILLECTOMY  ~ [place] Right 04/13/2019   Procedure: RIGHT TOTAL HIP REVISION-POSTERIOR  APPROACH LATERAL;  Surgeon: Marybelle Killings, MD;  Location: [place];  Service: Orthopedics;  Laterality: Right;   TOTAL KNEE ARTHROPLASTY Left 07/23/2016   Procedure: LEFT TOTAL KNEE ARTHROPLASTY;  Surgeon: Marybelle Killings, MD;  Location: [place];  Service: Orthopedics;  Laterality: Left;    There were no vitals filed for this visit.   Subjective Assessment - 05/06/21 1020     Subjective This 80yo male was referred to PT by Meridee Score, MD with [phone] (ICD-10-CM) - Right below-knee amputee. He underwent a right Transtibial Amputation on 12/12/2020 due to gangrene with revision on 01/09/2021.  He received his prosthesis 05/05/2021.    Patient is accompained by: Family member   granddaughter   Pertinent History Rt TTA, arthritis, right hip replacement, left knee arthroplasty, shoulder sg X2, gout, basal cell CA, LBP, back sg X3, IDDM, neuropathy, diverticulosis, heart murmur, HTN, MI,    Limitations Standing;House hold activities;Walking    Patient Stated Goals walk in community & on farm, get on tractor, yard work, drive    Currently in Pain? No/denies                [facility] PT Assessment - 05/06/21 1015       Assessment   Medical Diagnosis Right Transtibial Amputation    Referring Provider (PT) Meridee Score, MD    Onset Date/Surgical Date 05/05/21   prosthesis delivery   Hand Dominance Right    Prior Therapy inpatient rehab      Precautions   Precautions Fall      Balance Screen   Has the patient fallen in the past 6 months  No    Has the patient had a decrease in activity level because of a fear of falling?  Yes    Is the patient reluctant to leave their home because of a fear of falling?  No      Home Environment   Living Environment Private residence    Living Arrangements Alone    Type of [place] Access Level entry    [place] Two level    Alternate Level  Stairs-Number of Steps 14    Alternate Level Stairs-Rails Right    Home Equipment Walker - 2 wheels;Crutches;Bedside commode;Tub bench;Grab bars - tub/shower;Grab bars - toilet;Wheelchair - manual      Prior Function   Level of Independence Independent;Independent with household mobility without device;Independent with community mobility without device    Vocation Retired    Leisure farming, gardening, yard work      Haematologist Postural limitations    Postural Limitations Rounded Shoulders;Forward head;Flexed trunk;Weight shift left      ROM / Strength   AROM / PROM / Strength PROM;Strength      PROM   Right Knee Extension -9      Strength   Overall Strength Deficits    Strength Assessment Site Hip;Knee;Ankle    Right Hip Flexion 3+/5    Right Hip Extension 3-/5   grossly tested in sitting   Right Hip ABduction 3/5   grossly tested in sitting   Left Hip Flexion 5/5    Left Hip Extension 4/5   grossly tested in sitting   Left Hip ABduction 4/5   grossly tested in sitting   Right Knee Flexion 4-/5   grossly tested in sitting   Right Knee Extension 4/5    Left Knee Flexion 4/5   grossly tested in sitting   Left Knee Extension 5/5    Left Ankle Dorsiflexion 4/5      Transfers   Transfers Sit to Stand;Stand to Sit    Sit to Stand 5: Supervision;With upper extremity assist;With armrests;From chair/3-in-1;Other (comment)   requires RW to stabilize   Stand to Sit 5: Supervision;With upper extremity assist;With armrests;To chair/3-in-1;Other (comment)   requires RW for stability      Ambulation/Gait   Ambulation/Gait Yes    Ambulation/Gait Assistance 5: Supervision    Ambulation/Gait Assistance Details excessive UE weight bearing, partial weight on prosthesis    Ambulation Distance (Feet) 50 Feet    Assistive device Rolling walker;Prosthesis    Gait Pattern Step-to pattern;Decreased step length - right;Decreased step length - left;Decreased stance time - right;Decreased stride length;Decreased hip/knee flexion - right;Decreased weight shift to right;Right flexed knee in stance;Antalgic;Lateral hip instability;Trunk flexed    Ambulation Surface Level;Indoor    Gait velocity 0.95 ft/sec      Standardized Balance Assessment   Standardized Balance Assessment Berg Balance Test      Berg Balance Test   Sit to Stand Needs minimal aid to stand or to stabilize    Standing Unsupported Needs several tries to stand 30 seconds unsupported    Sitting with Back Unsupported but Feet Supported on Floor or Stool Able to sit safely and securely 2 minutes    Stand to Sit Controls descent by using hands    Transfers Able to transfer safely, definite need of hands    Standing Unsupported with Eyes Closed Unable to keep eyes closed 3 seconds but stays steady    Standing Unsupported with Feet Together Needs help to attain position and unable to hold for 15 seconds    From Standing, Reach Forward with Outstretched Arm Reaches forward but needs supervision    From Standing Position, Pick up Object from Floor Unable to pick up and needs supervision    From Standing Position, Turn to Look Behind Over each Shoulder Needs supervision when turning    Turn 360 Degrees Needs assistance while turning    Standing Unsupported, Alternately Place Feet on Step/Stool Needs assistance to keep from falling  or unable to try    Standing Unsupported, One Foot in [employer] balance while stepping or standing    Standing on One Leg Unable to try or needs assist to prevent fall    Total Score 16    Berg comment:  BERG  < 36 high risk for falls (close to 100%) 46-51 moderate (>50%)   37-45 significant (>80%) 52-55 lower (> 25%)             Prosthetics Assessment - 05/06/21 1015       Prosthetics   Prosthetic Care Dependent with Skin check;Residual limb care;Care of non-amputated limb;Prosthetic cleaning;Ply sock cleaning;Correct ply sock adjustment;Proper wear schedule/adjustment;Proper weight-bearing schedule/adjustment    Donning prosthesis  Supervision    Doffing prosthesis  Supervision    Current prosthetic wear tolerance (days/week)  only received prosthesis yesterday    Current prosthetic wear tolerance (#hours/day)  1 hour    Current prosthetic weight-bearing tolerance (hours/day)  Patient tolerated 5 minutes of standing & gait activities with partial weight on prosthesis with no c/o pain or limb discomfort.    Edema pitting edema    Residual limb condition  large scab on tibial tubercle that pt reports from protective limb system.  blister closed wound on incision that appears may be suture that did not resolve.  color & temperature normal. minimal to no hair growth. cylinderical shape.    Prosthesis Description silicon liner with shuttle pin lock system, secondary pelite liner, total contact socket, flexible keel foot.                          [facility] Adult PT Treatment/Exercise - 05/06/21 1015       Prosthetics   Prosthetic Care Comments  options for driving with RLE TTA. PT advised to initiate wear 2 hrs 2x/day with plan to increase weekly if no issues.  PT recommending cut off larger 2 Vivewear shrinkers under liner against skin.    Education Provided Skin check;Residual limb care;Prosthetic cleaning;Proper Donning;Proper wear schedule/adjustment;Other (comment)   see prosthetic care comments.   Person(s) Educated Patient;Child(ren)    Education Method Explanation;Demonstration;Tactile cues;Verbal cues    Education Method Verbalized understanding;Tactile cues  required;Verbal cues required;Needs further instruction                      PT Short Term Goals - 05/06/21 1642       PT SHORT TERM GOAL #1   Title Patient donnes prosthesis modified independent & verbalizes proper cleaning    Time 4    Period Weeks    Status New    Target Date 06/04/21      PT SHORT TERM GOAL #2   Title Patient tolerates prosthesis >8 hrs total /day without increase in skin issues    Time 4    Period Weeks    Status New    Target Date 06/04/21      PT SHORT TERM GOAL #3   Title Patient able to reach 7" and look over both shoulders without UE support with supervision.    Time 4    Period Weeks    Status New    Target Date 06/04/21      PT SHORT TERM GOAL #4   Title Patient ambulates 200' with RW & prosthesis with supervision & no loss of balance.    Time 4    Period Weeks    Status New    Target Date 06/04/21  PT SHORT TERM GOAL #5   Title Patient negotiates ramps & curbs with RW & prosthesis with supervision.    Time 4    Period Weeks    Status New    Target Date 06/04/21               PT Long Term Goals - 05/06/21 1640       PT LONG TERM GOAL #1   Title Patient demonstrates & verbalized understanding of prosthetic care to enable safe utilization of prosthesis.    Time 12    Period Weeks    Status New    Target Date 07/30/21      PT LONG TERM GOAL #2   Title Patient tolerates prosthesis wear >90% of awake hours without skin or limb pain issues.    Time 12    Period Weeks    Status New    Target Date 07/30/21      PT LONG TERM GOAL #3   Title Berg Balance >/= 36/56 to indicate lower fall risk    Time 12    Period Weeks    Status New    Target Date 07/30/21      PT LONG TERM GOAL #4   Title Patient ambulates 300' with LRAD & prosthesis modified independent    Time 12    Period Weeks    Status New    Target Date 07/30/21      PT LONG TERM GOAL #5   Title Patient negotiates ramps, curbs & stairs with LRAD  & prosthesis modified independent.    Time 12    Period Weeks    Status New    Target Date 07/30/21                   Plan - 05/06/21 1633     Clinical Impression Statement This 80yo male underwent a right Transtibial Amputation on 12/12/2020 with revision 01/09/2021 and received his first prosthesis on 05/05/2021. He has decreased knee extension. He has been limited in mobility for several months with resulting significant deconditioning.  He is dependent in proper prosthetic care which increases risk of skin issues and has some wounds on his residual limb.  Patient has only worn prosthesis one time for one hour since delivery yesterday which limited wear limits activity level.  Berg Balance score of 16/56 indicates high fall risk and dependency in standing ADLs.  Patient's prosthetic gait requires assistance, has significant deviations including excessive UE weight bearing and gait velocity of 0.95 ft/sec indicating high fall risk. Patient would benefit from skilled PT to improve function & safety with her prosthesis.    Personal Factors and Comorbidities Comorbidity 3+;Age;Time since onset of injury/illness/exacerbation    Comorbidities Rt TTA, arthritis, right hip replacement, left knee arthroplasty, shoulder sg X2, gout, basal cell CA, LBP, back sg X3, IDDM, neuropathy, diverticulosis, heart murmur, HTN, MI,    Examination-Activity Limitations Lift;Locomotion Level;Squat;Stairs;Stand;Transfers    Examination-Participation Restrictions Community Activity;Yard Work    Merchant navy officer Evolving/Moderate complexity    Clinical Decision Making Moderate    Rehab Potential Good    PT Frequency 2x / week    PT Duration 12 weeks    PT Treatment/Interventions ADLs/Self Care Home Management;DME Instruction;Gait training;Stair training;Functional mobility training;Therapeutic activities;Therapeutic exercise;Balance training;Neuromuscular re-education;Patient/family  education;Prosthetic Training;Vestibular;Passive range of motion    PT Next Visit Plan review prosthetic care, instruct in HEP at sink,  prosthetic gait with RW including ramps & curbs    Consulted  and Agree with Plan of Care Patient             Patient will benefit from skilled therapeutic intervention in order to improve the following deficits and impairments:  Abnormal gait, Decreased activity tolerance, Decreased balance, Decreased endurance, Decreased knowledge of use of DME, Decreased mobility, Decreased range of motion, Decreased skin integrity, Decreased scar mobility, Decreased strength, Increased edema, Impaired flexibility, Postural dysfunction, Prosthetic Dependency  Visit Diagnosis: Other abnormalities of gait and mobility  Unsteadiness on feet  Abnormal posture  Muscle weakness (generalized)  Stiffness of right knee, not elsewhere classified     Problem List Patient Active Problem List   Diagnosis Date Noted   Type 2 diabetes mellitus with diabetic polyneuropathy, with long-term current use of insulin ([place]) 03/24/2021   Hypokalemia    Constipation 02/02/2021   Fecal impaction ([place]) 02/01/2021   Wound dehiscence 01/09/2021   Dehiscence of amputation stump ([place])    Status post percutaneous transluminal coronary angioplasty 01/06/2021   Type II diabetes mellitus, uncontrolled ([place]) 01/06/2021   Acute pancreatitis 01/06/2021   Diabetic peripheral vascular disease ([place]) 01/06/2021   Encounter for screening for other disorder 01/06/2021   Enlarged prostate 01/06/2021   Foot ulcer, right ([place]) 01/06/2021   Gout 01/06/2021   Headache 01/06/2021   Neck pain 01/06/2021   Hypoglycemia 01/06/2021   Loss of appetite 01/06/2021   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   Peripheral neuropathy 01/06/2021   Sciatica 01/06/2021   Vitamin B12 deficiency 01/06/2021   Vitamin D deficiency 01/06/2021   Weakness 01/06/2021   Diabetes mellitus type 2 with neurological  manifestations ([place]) 01/06/2021   Protein-calorie malnutrition, severe 12/26/2020   Acute blood loss anemia 12/26/2020   Prerenal azotemia 12/26/2020   Below-knee amputation of right lower extremity ([place]) 12/12/2020   Gangrene of right foot ([place])    Diabetic neuropathy ([place]) 11/17/2020   Hyperglycemia due to type 2 diabetes mellitus ([place]) 11/17/2020   Long term (current) use of insulin ([place]) 11/17/2020   Obesity 11/17/2020   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/2019   Hip dislocation, right ([place]) 04/13/2019   Other intervertebral disc degeneration, lumbar region 03/30/2019   CAD (coronary artery disease) 01/30/2019   Tobacco abuse 01/30/2019   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   Burn, foot, second degree, left, initial encounter 06/08/2017   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 [place], [place], [address] Phone: [phone]   Fax:  [phone]  Name: Keith Espinoza MRN: [id number] Date of Birth: 04/04/1941

## 2021-05-06 NOTE — Patient Instructions (Signed)
Driving options with Below Knee Prosthesis   Option 1:  Remove prosthesis and use left leg to crossover drive Option 2:  2 Foot driving - right foot on gas & left foot on brake.  Prosthetic motion is leg press motion not ankle pump.  Focus is on heel pressing out not on toes/forefoot.   Option 3: Use prosthesis on both gas & brake.  Continue to focus on heel motion & position.  Option 4: Left foot accelerator.  Consider portable left foot accelerator ( http://plfa.org ) as allows to switch between cars or remove for other drivers.  Left foot both gas & brake.  Using cruise control to accelerate & decelerate or resume speed after stopping can safe some leg work / motion.   Practice initially in large empty parking lot. Stay in middle not near curbs. Practice turning & backing into parking spot on right & left. Parallel park.  Back up.  Quick stops. More about learning foot work not staying in motion.   

## 2021-05-07 ENCOUNTER — Encounter: Payer: Self-pay | Admitting: Orthopedic Surgery

## 2021-05-07 ENCOUNTER — Ambulatory Visit (INDEPENDENT_AMBULATORY_CARE_PROVIDER_SITE_OTHER): Payer: Medicare Other | Admitting: Physician Assistant

## 2021-05-07 DIAGNOSIS — Z89511 Acquired absence of right leg below knee: Secondary | ICD-10-CM
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
## 2021-05-13 ENCOUNTER — Ambulatory Visit (INDEPENDENT_AMBULATORY_CARE_PROVIDER_SITE_OTHER): Payer: Medicare Other | Admitting: Physical Therapy

## 2021-05-13 ENCOUNTER — Other Ambulatory Visit: Payer: Self-pay

## 2021-05-13 ENCOUNTER — Encounter: Payer: Self-pay | Admitting: Physical Therapy

## 2021-05-13 DIAGNOSIS — M25661 Stiffness of right knee, not elsewhere classified: Secondary | ICD-10-CM | POA: Diagnosis not present

## 2021-05-13 DIAGNOSIS — R293 Abnormal posture: Secondary | ICD-10-CM

## 2021-05-13 DIAGNOSIS — R2689 Other abnormalities of gait and mobility: Secondary | ICD-10-CM | POA: Diagnosis not present

## 2021-05-13 DIAGNOSIS — R2681 Unsteadiness on feet: Secondary | ICD-10-CM | POA: Diagnosis not present

## 2021-05-13 DIAGNOSIS — M6281 Muscle weakness (generalized): Secondary | ICD-10-CM | POA: Diagnosis not present

## 2021-05-13 NOTE — Patient Instructions (Signed)

## 2021-05-13 NOTE — Therapy (Signed)
[facility] Physical Therapy [address] [place], [place], [address] Phone: [phone]   Fax:  [phone]  Physical Therapy Treatment  Patient Details  Name: Keith Espinoza MRN: [id number] Date of Birth: 09-Nov-1940 Referring Provider (PT): Meridee Score, MD   Encounter Date: 05/13/2021   PT End of Session - 05/13/21 1055     Visit Number 2    Number of Visits 25    Date for PT Re-Evaluation 07/30/21    Authorization Type Medicare A&B and [employer]    Progress Note Due on Visit 10    PT Start Time 1055    PT Stop Time 1145    PT Time Calculation (min) 50 min    Equipment Utilized During Treatment Gait belt    Activity Tolerance Patient tolerated treatment well    Behavior During Therapy [facility] for tasks assessed/performed             Past Medical History:  Diagnosis Date   Allergic rhinitis    Allergic rhinitis    Arthritis    Basal cell carcinoma 11/01/2019    bcc left chest treatment TX cx3 [id number]    Chronic leg pain    right   Chronic lower back pain    Coronary artery disease    a. Stenting to RCA 2004; staged DES to LAD and Cx 2004. DES to mRCA 2012. b. DES to mCx, PTCA to dCx 11/2011. c. Lateral wall MI 2013 s/p PTCA to distal Cx & DES to mid OM2 11/2011. d. Low risk nuc 04/2014, EF wnl.   COVID-19    Diabetes mellitus    Insulin dependent   Diabetic neuropathy ([place]    MILD   Diverticulosis    Dysrhythmia    GRosanna Randysyndrome    Gout    right wrist; right foot; right elbow; have had it since 1970's   H/O hiatal hernia    Heart murmur    History of echocardiogram    aortic sclerosis per echo 12/09 EF 65%, otherwise normal   History of hemorrhoids    BLEEDING   History of kidney stones    h/o   Hypertension    Diagnosed 1995    Myocardial infarction ([facility]    Pancreatic pseudocyst    a. s/p remote drainage 2006.   Thrombocytopenia ([place]    Seen on oldest labs in system from 2004   Vitamin B 12 deficiency    orally replaced     Past Surgical History:  Procedure Laterality Date   ABDOMINAL AORTOGRAM W/LOWER EXTREMITY Bilateral 08/08/2020   Procedure: ABDOMINAL AORTOGRAM W/LOWER EXTREMITY;  Surgeon: DAngelia Mould MD;  [facility];  Service: Cardiovascular;  Laterality: Bilateral;   AMPUTATION Right 12/12/2020   Procedure: RIGHT BELOW KNEE AMPUTATION;  Surgeon: DNewt Minion MD;  [place] [facility]  Service: Orthopedics;  Laterality: Right;   BACK SURGERY     "total of 3 times" S/P fall    CARPAL TUNNEL RELEASE Bilateral    CHOLECYSTECTOMY  1990's   COLONOSCOPY     CORONARY ANGIOPLASTY  11/11/11   CORONARY ANGIOPLASTY WITH STENT PLACEMENT  09/30/2011   "1 then; makes a total of 4"   CORONARY ANGIOPLASTY WITH STENT PLACEMENT  11/11/11   "1; makes a total of 5"   INGUINAL HERNIA REPAIR  2003   right   JOINT REPLACEMENT Right 04/03/2002   hip replacment   KNEE ARTHROSCOPY  1990's   left   LEFT HEART CATHETERIZATION  WITH CORONARY ANGIOGRAM N/A 09/30/2011   Procedure: LEFT HEART CATHETERIZATION WITH CORONARY ANGIOGRAM;  Surgeon: Jettie Booze, MD;  Location: [facility] CATH LAB;  Service: Cardiovascular;  Laterality: N/A;  possible PCI   LEFT HEART CATHETERIZATION WITH CORONARY ANGIOGRAM N/A 11/15/2011   Procedure: LEFT HEART CATHETERIZATION WITH CORONARY ANGIOGRAM;  Surgeon: Jettie Booze, MD;  Location: [facility] CATH LAB;  Service: Cardiovascular;  Laterality: N/A;   PERCUTANEOUS CORONARY STENT INTERVENTION (PCI-S)  09/30/2011   Procedure: PERCUTANEOUS CORONARY STENT INTERVENTION (PCI-S);  Surgeon: Jettie Booze, MD;  Location: [facility] CATH LAB;  Service: Cardiovascular;;   PERCUTANEOUS CORONARY STENT INTERVENTION (PCI-S) N/A 11/11/2011   Procedure: PERCUTANEOUS CORONARY STENT INTERVENTION (PCI-S);  Surgeon: Jettie Booze, MD;  Location: [facility] CATH LAB;  Service: Cardiovascular;  Laterality: N/A;   PERIPHERAL VASCULAR BALLOON ANGIOPLASTY Right 08/08/2020   Procedure: PERIPHERAL VASCULAR  BALLOON ANGIOPLASTY;  Surgeon: Angelia Mould, MD;  Location: [place] [facility];  Service: Cardiovascular;  Laterality: Right;  Posterior tibial    SHOULDER SURGERY Right    X 2   STUMP REVISION Right 01/09/2021   Procedure: REVISION RIGHT BELOW KNEE AMPUTATION;  Surgeon: [name], MD;  Location: [place];  Service: Orthopedics;  Laterality: Right;   TONSILLECTOMY  ~ Industry Right 04/13/2019   Procedure: RIGHT TOTAL HIP REVISION-POSTERIOR  APPROACH LATERAL;  Surgeon: Marybelle Killings, MD;  Location: [place];  Service: Orthopedics;  Laterality: Right;   TOTAL KNEE ARTHROPLASTY Left 07/23/2016   Procedure: LEFT TOTAL KNEE ARTHROPLASTY;  Surgeon: Marybelle Killings, MD;  Location: [place];  Service: Orthopedics;  Laterality: Left;    There were no vitals filed for this visit.   Subjective Assessment - 05/13/21 1056     Subjective He had blister after PT that PA saw next day who thinks it is healing.  He is wearing prosthesis daily 45-60 min limited by pain up to 10/10 when walkig & when prosthesis hits scab.    Patient is accompained by: Family member   granddaughter   Pertinent History Rt TTA, arthritis, right hip replacement, left knee arthroplasty, shoulder sg X2, gout, basal cell CA, LBP, back sg X3, IDDM, neuropathy, diverticulosis, heart murmur, HTN, MI,    Limitations Standing;House hold activities;Walking    Patient Stated Goals walk in community & on farm, get on tractor, yard work, drive    Currently in Pain? No/denies                               [facility] Adult PT Treatment/Exercise - 05/13/21 1056       Transfers   Transfers Sit to Stand;Stand to Sit    Sit to Stand 5: Supervision;With upper extremity assist;With armrests;From chair/3-in-1;Other (comment)   requires RW to stabilize   Stand to Sit 5: Supervision;With upper extremity assist;With armrests;To chair/3-in-1;Other (comment)   requires RW for stability     Ambulation/Gait    Ambulation/Gait --    Ambulation/Gait Assistance --    Ambulation Distance (Feet) --    Assistive device --    Gait Pattern --    Gait velocity --      Posture/Postural Control   Posture/Postural Control --    Postural Limitations --      Therapeutic Activites    Therapeutic Activities ADL's    ADL's using 24" bar stool as modified stand to work on core stabilization & upright tolerance without UE  support and modified standing for ADLs.  Pt & dtr verbalized understanding.      Neuro Re-ed    Neuro Re-ed Details  See pt instructions for HEP at sink.  HEP to work on standing balance, standing tolerane & proprioception with socket / residual limb interface.  Pt verbalized & return demo understanding.      Prosthetics   Prosthetic Care Comments  wear prosthesis total of 4 hrs between 2-4 wears.  Continue cutoff Vivewear shrinker under liner in direct contact with skin.  PT checked wounds initially & after standing intermittently with HEP. No changes to wounds.  Pt reports that full length Vivewear shrinkers are XL but hurts tibial crest wound so can't sleep in them.  PT recommended wearing XL during day when not wearing prosthesis and XXL (issued today) at night.  PT instructed in importance of Vivewear shrinkers to heal wounds & manage edema.    Current prosthetic wear tolerance (days/week)  daily    Current prosthetic wear tolerance (#hours/day)  45-60 minutes limited by pain in wound on tibial crest.    Current prosthetic weight-bearing tolerance (hours/day)  Patient tolerated 5 minutes of standing & gait activities with partial weight on prosthesis with "mild" limb discomfort.    Edema pitting edema    Residual limb condition  large scab on tibial tubercle that pt reports from protective limb system.  blister closed wound on incision that appears may be suture that did not resolve.  color & temperature normal. minimal to no hair growth. cylinderical shape.    Education Provided Skin  check;Residual limb care;Proper Donning;Proper wear schedule/adjustment;Other (comment)   see prosthetic care comments.   Person(s) Educated Patient;Child(ren)    Education Method Explanation;Demonstration;Tactile cues;Verbal cues    Education Method Verbalized understanding;Tactile cues required;Verbal cues required;Needs further instruction    Donning Prosthesis Minimal assist                      PT Short Term Goals - 05/06/21 1642       PT SHORT TERM GOAL #1   Title Patient donnes prosthesis modified independent & verbalizes proper cleaning    Time 4    Period Weeks    Status New    Target Date 06/04/21      PT SHORT TERM GOAL #2   Title Patient tolerates prosthesis >8 hrs total /day without increase in skin issues    Time 4    Period Weeks    Status New    Target Date 06/04/21      PT SHORT TERM GOAL #3   Title Patient able to reach 7" and look over both shoulders without UE support with supervision.    Time 4    Period Weeks    Status New    Target Date 06/04/21      PT SHORT TERM GOAL #4   Title Patient ambulates 200' with RW & prosthesis with supervision & no loss of balance.    Time 4    Period Weeks    Status New    Target Date 06/04/21      PT SHORT TERM GOAL #5   Title Patient negotiates ramps & curbs with RW & prosthesis with supervision.    Time 4    Period Weeks    Status New    Target Date 06/04/21               PT Long Term Goals - 05/06/21 1640  PT LONG TERM GOAL #1   Title Patient demonstrates & verbalized understanding of prosthetic care to enable safe utilization of prosthesis.    Time 12    Period Weeks    Status New    Target Date 07/30/21      PT LONG TERM GOAL #2   Title Patient tolerates prosthesis wear >90% of awake hours without skin or limb pain issues.    Time 12    Period Weeks    Status New    Target Date 07/30/21      PT LONG TERM GOAL #3   Title Berg Balance >/= 36/56 to indicate lower fall risk     Time 12    Period Weeks    Status New    Target Date 07/30/21      PT LONG TERM GOAL #4   Title Patient ambulates 300' with LRAD & prosthesis modified independent    Time 12    Period Weeks    Status New    Target Date 07/30/21      PT LONG TERM GOAL #5   Title Patient negotiates ramps, curbs & stairs with LRAD & prosthesis modified independent.    Time 12    Period Weeks    Status New    Target Date 07/30/21                   Plan - 05/13/21 1055     Clinical Impression Statement PT reviewed proper donning, shrinker wear & prosthesis wear which he appears to understand.  PT instructed in HEP at sink for balance, standing endurance & proprioception using socket / residual limb. He appears to understand HEP.    Personal Factors and Comorbidities Comorbidity 3+;Age;Time since onset of injury/illness/exacerbation    Comorbidities Rt TTA, arthritis, right hip replacement, left knee arthroplasty, shoulder sg X2, gout, basal cell CA, LBP, back sg X3, IDDM, neuropathy, diverticulosis, heart murmur, HTN, MI,    Examination-Activity Limitations Lift;Locomotion Level;Squat;Stairs;Stand;Transfers    Examination-Participation Restrictions Community Activity;Yard Work    [employer] Evolving/Moderate complexity    Rehab Potential Good    PT Frequency 2x / week    PT Duration 12 weeks    PT Treatment/Interventions ADLs/Self Care Home Management;DME Instruction;Gait training;Stair training;Functional mobility training;Therapeutic activities;Therapeutic exercise;Balance training;Neuromuscular re-education;Patient/family education;Prosthetic Training;Vestibular;Passive range of motion    PT Next Visit Plan prosthetic gait with RW including ramps & curbs    Consulted and Agree with Plan of Care Patient             Patient will benefit from skilled therapeutic intervention in order to improve the following deficits and impairments:  Abnormal gait, Decreased  activity tolerance, Decreased balance, Decreased endurance, Decreased knowledge of use of DME, Decreased mobility, Decreased range of motion, Decreased skin integrity, Decreased scar mobility, Decreased strength, Increased edema, Impaired flexibility, Postural dysfunction, Prosthetic Dependency  Visit Diagnosis: Other abnormalities of gait and mobility  Unsteadiness on feet  Abnormal posture  Muscle weakness (generalized)  Stiffness of right knee, not elsewhere classified     Problem List Patient Active Problem List   Diagnosis Date Noted   Type 2 diabetes mellitus with diabetic polyneuropathy, with long-term current use of insulin ([place]) 03/24/2021   Hypokalemia    Constipation 02/02/2021   Fecal impaction ([place]) 02/01/2021   Wound dehiscence 01/09/2021   Dehiscence of amputation stump (H[facility])    Status post percutaneous transluminal coronary angioplasty 01/06/2021   Type II diabetes mellitus, uncontrolled ([employer]) 01/06/2021  Acute pancreatitis 01/06/2021   Diabetic peripheral vascular disease ([place]) 01/06/2021   Encounter for screening for other disorder 01/06/2021   Enlarged prostate 01/06/2021   Foot ulcer, right ([place]) 01/06/2021   Gout 01/06/2021   Headache 01/06/2021   Neck pain 01/06/2021   Hypoglycemia 01/06/2021   Loss of appetite 01/06/2021   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   Peripheral neuropathy 01/06/2021   Sciatica 01/06/2021   Vitamin B12 deficiency 01/06/2021   Vitamin D deficiency 01/06/2021   Weakness 01/06/2021   Diabetes mellitus type 2 with neurological manifestations ([place]) 01/06/2021   Protein-calorie malnutrition, severe 12/26/2020   Acute blood loss anemia 12/26/2020   Prerenal azotemia 12/26/2020   Below-knee amputation of right lower extremity ([place]) 12/12/2020   Gangrene of right foot ([place])    Diabetic neuropathy ([place]) 11/17/2020   Hyperglycemia due to type 2 diabetes mellitus ([place]) 11/17/2020   Long term (current) use of insulin ([place])  11/17/2020   Obesity 11/17/2020   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/2019   Hip dislocation, right ([place]) 04/13/2019   Other intervertebral disc degeneration, lumbar region 03/30/2019   CAD (coronary artery disease) 01/30/2019   Tobacco abuse 01/30/2019   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   Burn, foot, second degree, left, initial encounter 06/08/2017   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 03/16/2017   S/P total knee arthroplasty, left 10/26/2016   Hyperlipidemia 09/04/2014   Thrombocytopenia (H[facility])    Precordial chest pain 04/05/2014   Coronary atherosclerosis of native coronary artery 10/01/2013   Other and unspecified hyperlipidemia 10/01/2013   Primary hypertension 10/01/2013   Diabetes mellitus ([place]) 10/01/2013   Esophageal reflux 10/01/2013   Hypertrophy of prostate without urinary obstruction and other lower urinary tract symptoms (LUTS) 10/01/2013    Jamey Reas, PT, DPT 05/13/2021, 3:57 PM  [facility] Physical Therapy [address] [place], [place], [address] Phone: [phone]   Fax:  [phone]  Name: Keith Espinoza MRN: [id number] Date of Birth: [birth year]

## 2021-05-18 ENCOUNTER — Ambulatory Visit (INDEPENDENT_AMBULATORY_CARE_PROVIDER_SITE_OTHER): Payer: Medicare Other | Admitting: Physical Therapy

## 2021-05-18 ENCOUNTER — Other Ambulatory Visit: Payer: Self-pay

## 2021-05-18 ENCOUNTER — Ambulatory Visit: Payer: Medicare Other | Admitting: *Deleted

## 2021-05-18 ENCOUNTER — Encounter: Payer: Self-pay | Admitting: Physical Therapy

## 2021-05-18 DIAGNOSIS — R2689 Other abnormalities of gait and mobility: Secondary | ICD-10-CM | POA: Diagnosis not present

## 2021-05-18 DIAGNOSIS — M6281 Muscle weakness (generalized): Secondary | ICD-10-CM | POA: Diagnosis not present

## 2021-05-18 DIAGNOSIS — R2681 Unsteadiness on feet: Secondary | ICD-10-CM | POA: Diagnosis not present

## 2021-05-18 DIAGNOSIS — M25661 Stiffness of right knee, not elsewhere classified: Secondary | ICD-10-CM | POA: Diagnosis not present

## 2021-05-18 DIAGNOSIS — R293 Abnormal posture: Secondary | ICD-10-CM
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
## 2021-05-18 NOTE — Patient Instructions (Signed)
[employer] Socks: 1-ply is yellow color at top, 3-ply is green at top, 5-ply is navy blue at top How many ply you need depends on your limb size.  You should have even pressure on your limb when standing & walking.  Guidance points: 1. How ease it goes on? Should be some resistance. Too few it goes on too easily. Too many it takes a lot of work to get it on. 2. How many clicks you get. Especially clicks in sitting. 3. After standing or walking, check knee cap. Bottom should be just under the front lip.  Too few bottom of knee cap sits on indention. Too many bottom is above front lip. 4. Have your feet beside each other & hips over feet. Place hands on your waist. Pelvis Should be level. Too few prosthetic side will be low. Too many prosthetic side will be high.    Get ply socks correct before you leave the house. Take extra socks with you. Take one 3-ply and two 1-ply with you. This is in addition to what you are wearing.   

## 2021-05-21 ENCOUNTER — Ambulatory Visit: Payer: Medicare Other | Admitting: Orthopedic Surgery

## 2021-05-22 ENCOUNTER — Ambulatory Visit (INDEPENDENT_AMBULATORY_CARE_PROVIDER_SITE_OTHER): Payer: Medicare Other | Admitting: Physical Therapy

## 2021-05-22 ENCOUNTER — Other Ambulatory Visit: Payer: Self-pay

## 2021-05-22 ENCOUNTER — Other Ambulatory Visit: Payer: Self-pay | Admitting: *Deleted

## 2021-05-22 DIAGNOSIS — R2681 Unsteadiness on feet: Secondary | ICD-10-CM

## 2021-05-22 DIAGNOSIS — M25661 Stiffness of right knee, not elsewhere classified: Secondary | ICD-10-CM

## 2021-05-22 DIAGNOSIS — R2689 Other abnormalities of gait and mobility: Secondary | ICD-10-CM

## 2021-05-22 DIAGNOSIS — R293 Abnormal posture: Secondary | ICD-10-CM

## 2021-05-22 DIAGNOSIS — M6281 Muscle weakness (generalized): Secondary | ICD-10-CM | POA: Diagnosis not present

## 2021-05-22 NOTE — Patient Outreach (Signed)
[facility]) Care Management  05/22/2021  Keith Espinoza May 08, 1941 [id number]   Outgoing call placed to member. State he is adapting well to his prosthesis.  Denies any urgent concerns, encouraged to contact this care manager with questions.  Agrees to follow up within the next month.   Goals Addressed             This Visit's Progress    ( THN)Monitor and Manage My Blood Sugar   On track    Timeframe:  Long-Range Goal Priority:  Medium Start Date:  [id number]                        Expected End Date:  [id number]                    Follow Up Date [id number]   - check blood sugar at prescribed times - check blood sugar if I feel it is too high or too low - enter blood sugar readings and medication or insulin into daily log - take the blood sugar log to all doctor visits - take the blood sugar meter to all doctor visits    Why is this important?   Checking your blood sugar at home helps to keep it from getting very high or very low.  Writing the results in a diary or log helps the doctor know how to care for you.  Your blood sugar log should have the time, date and the results.  Also, write down the amount of insulin or other medicine that you take.  Other information, like what you ate, exercise done and how you were feeling, will also be helpful.     Notes:   4/19 - Encouraged to check blood sugars daily  5/10 - Reminded of importance of managing daily blood sugars are with wound healing.  Unable to report blood sugar reading for today.  5/25 - report blood sugars are still slightly elevated, today was 294.  Has appointment with endocrinology on 6/21  6/24 - Blood sugars are trending down although today's reading was 230.  Insulin was decreased but has since increased it again to compensate the elevated blood sugars.  He will make provider aware  7/25 - Report blood sugars are better, range 150-170 over the last few weeks  8/19 - State blood sugars are  better controlled.  Today it was 104     [facility]) Make and Keep All Appointments   On track    Timeframe:  Long-Range Goal Priority:  Medium Start Date:   [id number]                          Expected End Date:   [id number]                   Follow Up Date [id number]   Barriers: Knowledge  - call to cancel if needed - keep a calendar with prescription refill dates - keep a calendar with appointment dates    Why is this important?   Part of staying healthy is seeing the doctor for follow-up care.  If you forget your appointments, there are some things you can do to stay on track.    Notes:  Need to schedule exam  4/19 - Recently discharged from readmission - Reviewed follow up scheduled with member, had visit with ortho on yesterday, repeat next week  5/10 - Follow up with  PCP completed yesterday.  Cardiology, vascular, and ortho appointments all scheduled for 5/18  5/25 - Confirms all 5/18 appointments were attended, will follow up with ortho on 6/7  6/24 - Followed up with ortho on 6/7, next visit scheduled for 7/5.  Daughter will provide transportation  7/25 - Report appointment with ortho on 8/2 to receive prosthetic, will have PT evaluation on 8/3  8/19 - Has been compliant with all appointments with therapy sessions.  Has follow up with endocrinology on 9/27     [facility] Foot Care   On track    Timeframe:  Long-Range Goal Priority:  Medium Start Date:  [id number]                           Expected End Date:     [id number]                 Follow Up Date [id number]  Barriers: Health Behaviors Knowledge    - check feet daily for cuts, sores or redness - keep feet up while sitting - trim toenails straight across - wash and dry feet carefully every day - wear comfortable, cotton socks - wear comfortable, well-fitting shoes    Why is this important?   Good foot care is very important when you have diabetes.  There are many things you can do to keep your feet healthy and  catch a problem early.    Notes:  Patient has ulcers on toes. Great toe has lost its toenail and is now draining [id number] foot is healing some  4/19 - Patient had right BKA, admitted for revision, discussed stump care and follow up with MD  5/10 - Member report staples removed, now has a few stitches with minimal drainage.  Plan is to fit for prosthetic once wound is fully healed.  Remains active with home health for nursing and PT/OT  5/25 - Report wound on stump healing, no issues with remaining foot  6/24 - Report stump continues to heal, still have small area where he is washing with soap and water twice a day.  Hoping to be able to be fit for prosthetic within the next couple weeks  7/25 - State wound has healed, providers feel his is ready for prosthetic  8/19 - Report he now has his prosthesis, working with rehab for PT     [facility] Set My Target A1C   On track    Timeframe:  Long-Range Goal Priority:  High Start Date:  [id number]                           Expected End Date:    [id number]                  Follow Up Date [id number]   Barriers: Knowledge  - set target A1C - less than 7   Why is this important?   Your target A1C is decided together by you and your doctor.  It is based on several things like your age and other health issues.    Notes: 7.0  4/19 - Patient aware of proper diet to manage A1C as well as adherence to medication regime  5/10 - Education sent on managing diabetes and decreasing A1C  5/25 - Understand importance of managing diabetes in relation to wound care  6/24 - Report seen by endocrinology on 6/21, will revisit on 9/27.  Medication changes made, will  continue to monitor blood sugars  7/25 - A1C currently 7.5, report understanding of DM management in effort to decrease to goal  8/19 - Continuing to monitor and manage blood sugars to decrease A1C to goal       Valente David, Therapist, sports, MSN [place]  Manager [phone]

## 2021-05-22 NOTE — Therapy (Signed)
[facility] [address] [place], [place], [address] Phone: [phone]   Fax:  [phone]  Physical Therapy Treatment  Patient Details  Name: Keith Espinoza MRN: [id number] Date of Birth: 10-29-1940 Referring Provider (PT): Meridee Score, MD   Encounter Date: 05/22/2021   PT End of Session - 05/22/21 0852     Visit Number 4    Number of Visits 25    Date for PT Re-Evaluation 07/30/21    Authorization Type Medicare A&B and [employer]    Progress Note Due on Visit 10    PT Start Time 0802    PT Stop Time 0846    PT Time Calculation (min) 44 min    Equipment Utilized During Treatment Gait belt    Activity Tolerance Patient tolerated treatment well    Behavior During Therapy [facility] for tasks assessed/performed             Past Medical History:  Diagnosis Date   Allergic rhinitis    Allergic rhinitis    Arthritis    Basal cell carcinoma 11/01/2019    bcc left chest treatment TX cx3 17f    Chronic leg pain    right   Chronic lower back pain    Coronary artery disease    a. Stenting to RCA 2004; staged DES to LAD and Cx 2004. DES to mRCA 2012. b. DES to mCx, PTCA to dCx 11/2011. c. Lateral wall MI 2013 s/p PTCA to distal Cx & DES to mid OM2 11/2011. d. Low risk nuc 04/2014, EF wnl.   COVID-19    Diabetes mellitus    Insulin dependent   Diabetic neuropathy ([place]    MILD   Diverticulosis    Dysrhythmia    GRosanna Randysyndrome    Gout    right wrist; right foot; right elbow; have had it since 1970's   H/O hiatal hernia    Heart murmur    History of echocardiogram    aortic sclerosis per echo 12/09 EF 65%, otherwise normal   History of hemorrhoids    BLEEDING   History of kidney stones    h/o   Hypertension    Diagnosed 1995    Myocardial infarction ([facility]    Pancreatic pseudocyst    a. s/p remote drainage 2006.   Thrombocytopenia ([place]    Seen on oldest labs in system from 2004   Vitamin B 12 deficiency    orally replaced     Past Surgical History:  Procedure Laterality Date   ABDOMINAL AORTOGRAM W/LOWER EXTREMITY Bilateral 08/08/2020   Procedure: ABDOMINAL AORTOGRAM W/LOWER EXTREMITY;  Surgeon: DAngelia Mould MD;  [place];  Service: Cardiovascular;  Laterality: Bilateral;   AMPUTATION Right 12/12/2020   Procedure: RIGHT BELOW KNEE AMPUTATION;  Surgeon: DNewt Minion MD;  [place]  Service: Orthopedics;  Laterality: Right;   BACK SURGERY     "total of 3 times" S/P fall    CARPAL TUNNEL RELEASE Bilateral    CHOLECYSTECTOMY  1990's   COLONOSCOPY     CORONARY ANGIOPLASTY  11/11/11   CORONARY ANGIOPLASTY WITH STENT PLACEMENT  09/30/2011   "1 then; makes a total of 4"   CORONARY ANGIOPLASTY WITH STENT PLACEMENT  11/11/11   "1; makes a total of 5"   INGUINAL HERNIA REPAIR  2003   right   JOINT REPLACEMENT Right 04/03/2002   hip replacment   KNEE ARTHROSCOPY  1990's   left   LEFT HEART CATHETERIZATION  WITH CORONARY ANGIOGRAM N/A 09/30/2011   Procedure: LEFT HEART CATHETERIZATION WITH CORONARY ANGIOGRAM;  Surgeon: Jettie Booze, MD;  Location: [facility] CATH LAB;  Service: Cardiovascular;  Laterality: N/A;  possible PCI   LEFT HEART CATHETERIZATION WITH CORONARY ANGIOGRAM N/A 11/15/2011   Procedure: LEFT HEART CATHETERIZATION WITH CORONARY ANGIOGRAM;  Surgeon: Jettie Booze, MD;  Location: [facility] CATH LAB;  Service: Cardiovascular;  Laterality: N/A;   PERCUTANEOUS CORONARY STENT INTERVENTION (PCI-S)  09/30/2011   Procedure: PERCUTANEOUS CORONARY STENT INTERVENTION (PCI-S);  Surgeon: Jettie Booze, MD;  Location: [facility] CATH LAB;  Service: Cardiovascular;;   PERCUTANEOUS CORONARY STENT INTERVENTION (PCI-S) N/A 11/11/2011   Procedure: PERCUTANEOUS CORONARY STENT INTERVENTION (PCI-S);  Surgeon: Jettie Booze, MD;  Location: [facility] CATH LAB;  Service: Cardiovascular;  Laterality: N/A;   PERIPHERAL VASCULAR BALLOON ANGIOPLASTY Right 08/08/2020   Procedure: PERIPHERAL VASCULAR  BALLOON ANGIOPLASTY;  Surgeon: Angelia Mould, MD;  Location: [place] [facility];  Service: Cardiovascular;  Laterality: Right;  Posterior tibial    SHOULDER SURGERY Right    X 2   STUMP REVISION Right 01/09/2021   Procedure: REVISION RIGHT BELOW KNEE AMPUTATION;  Surgeon: [name], MD;  Location: [place];  Service: Orthopedics;  Laterality: Right;   TONSILLECTOMY  ~ [place] Right 04/13/2019   Procedure: RIGHT TOTAL HIP REVISION-POSTERIOR  APPROACH LATERAL;  Surgeon: Marybelle Killings, MD;  Location: [place];  Service: Orthopedics;  Laterality: Right;   TOTAL KNEE ARTHROPLASTY Left 07/23/2016   Procedure: LEFT TOTAL KNEE ARTHROPLASTY;  Surgeon: Marybelle Killings, MD;  Location: [place];  Service: Orthopedics;  Laterality: Left;    There were no vitals filed for this visit.   Subjective Assessment - 05/22/21 0833     Subjective He relays the scab finally came off his residual limb. He relays he had distal residual limb pain this morning when he first put on his prosthesis but it is no longer painful upon arrival.    Patient is accompained by: Family member   granddaughter   Pertinent History Rt TTA, arthritis, right hip replacement, left knee arthroplasty, shoulder sg X2, gout, basal cell CA, LBP, back sg X3, IDDM, neuropathy, diverticulosis, heart murmur, HTN, MI,    Limitations Standing;House hold activities;Walking    Patient Stated Goals walk in community & on farm, get on tractor, yard work, drive    Pain Onset 1 to 4 weeks ago                               [facility] Adult PT Treatment/Exercise - 05/22/21 0001       Transfers   Transfers Sit to Stand;Stand to Sit    Sit to Stand 5: Supervision;With upper extremity assist;With armrests;From chair/3-in-1;Other (comment)   requires RW to stabilize   Stand to Sit 5: Supervision;With upper extremity assist;With armrests;To chair/3-in-1;Other (comment)   requires RW for stability     Ambulation/Gait    Ambulation/Gait Yes    Ambulation/Gait Assistance 5: Supervision    Ambulation/Gait Assistance Details cues for longer step on left foot to at least clear his right, needed constant cuing and tactile cues at first but then able to perform by end of session    Ambulation Distance (Feet) 100 Feet   X 3   Assistive device Rolling walker;Prosthesis    Gait Pattern Step-to pattern;Decreased step length - right;Decreased step length - left;Decreased stance time - right;Decreased stride  length;Decreased hip/knee flexion - right;Decreased weight shift to right;Right flexed knee in stance;Antalgic;Lateral hip instability;Trunk flexed    Ramp 4: Min assist   RW with TTA prosthesis   Curb 4: Min assist   [employer] w/RW & TTA prosthesis     Neuro Re-ed    Neuro Re-ed Details  lateral walking and retrowalking with UE support X 3 round trips, balance with feet together one minute, balance with feet apart and eyes closed 15 sec X 5, balance on airex pad with feet apart 1 minute then head turns and head nods X 10 ea      Exercises   Exercises Knee/Hip      Knee/Hip Exercises: Stretches   Active Hamstring Stretch Right;4 reps;30 seconds    Active Hamstring Stretch Limitations 2 with strap, 2 without      Knee/Hip Exercises: Machines for Strengthening   Total Gym Leg Press DL 50# X 20, Rt leg only 25# X 15      Knee/Hip Exercises: Seated   Sit to Sand 10 reps;without UE support   from 24 inch barstool                     PT Short Term Goals - 05/18/21 1023       PT SHORT TERM GOAL #1   Title Patient donnes prosthesis modified independent & verbalizes proper cleaning    Time 4    Period Weeks    Status On-going    Target Date 06/04/21      PT SHORT TERM GOAL #2   Title Patient tolerates prosthesis >8 hrs total /day without increase in skin issues    Time 4    Period Weeks    Status On-going    Target Date 06/04/21      PT SHORT TERM GOAL #3   Title Patient able to reach 7" and  look over both shoulders without UE support with supervision.    Time 4    Period Weeks    Status On-going    Target Date 06/04/21      PT SHORT TERM GOAL #4   Title Patient ambulates 200' with RW & prosthesis with supervision & no loss of balance.    Time 4    Period Weeks    Status On-going    Target Date 06/04/21      PT SHORT TERM GOAL #5   Title Patient negotiates ramps & curbs with RW & prosthesis with supervision.    Time 4    Period Weeks    Status On-going    Target Date 06/04/21               PT Long Term Goals - 05/18/21 1023       PT LONG TERM GOAL #1   Title Patient demonstrates & verbalized understanding of prosthetic care to enable safe utilization of prosthesis.    Time 12    Period Weeks    Status On-going    Target Date 07/30/21      PT LONG TERM GOAL #2   Title Patient tolerates prosthesis wear >90% of awake hours without skin or limb pain issues.    Time 12    Period Weeks    Status On-going    Target Date 07/30/21      PT LONG TERM GOAL #3   Title Berg Balance >/= 36/56 to indicate lower fall risk    Time 12    Period Weeks  Status On-going    Target Date 07/30/21      PT LONG TERM GOAL #4   Title Patient ambulates 300' with LRAD & prosthesis modified independent    Time 12    Period Weeks    Status On-going    Target Date 07/30/21      PT LONG TERM GOAL #5   Title Patient negotiates ramps, curbs & stairs with LRAD & prosthesis modified independent.    Time 12    Period Weeks    Status On-going    Target Date 07/30/21                   Plan - 05/22/21 0853     Clinical Impression Statement He had good tolerance to prosthetic training session but did get some cramping in his hamstring which was relieved with stretching. He also started to get some soreness by the end of session so we will monitor this before progressing his activity. He will follow up with prosthetist next wednesday.    Personal Factors and  Comorbidities Comorbidity 3+;Age;Time since onset of injury/illness/exacerbation    Comorbidities Rt TTA, arthritis, right hip replacement, left knee arthroplasty, shoulder sg X2, gout, basal cell CA, LBP, back sg X3, IDDM, neuropathy, diverticulosis, heart murmur, HTN, MI,    Examination-Activity Limitations Lift;Locomotion Level;Squat;Stairs;Stand;Transfers    Examination-Participation Restrictions Community Activity;Yard Work    [employer] Evolving/Moderate complexity    Rehab Potential Good    PT Frequency 2x / week    PT Duration 12 weeks    PT Treatment/Interventions ADLs/Self Care Home Management;DME Instruction;Gait training;Stair training;Functional mobility training;Therapeutic activities;Therapeutic exercise;Balance training;Neuromuscular re-education;Patient/family education;Prosthetic Training;Vestibular;Passive range of motion    PT Next Visit Plan progress prosthetic gait with RW including ramps & curbs and standing balance,  check wounds    Consulted and Agree with Plan of Care Patient             Patient will benefit from skilled therapeutic intervention in order to improve the following deficits and impairments:  Abnormal gait, Decreased activity tolerance, Decreased balance, Decreased endurance, Decreased knowledge of use of DME, Decreased mobility, Decreased range of motion, Decreased skin integrity, Decreased scar mobility, Decreased strength, Increased edema, Impaired flexibility, Postural dysfunction, Prosthetic Dependency  Visit Diagnosis: Other abnormalities of gait and mobility  Unsteadiness on feet  Abnormal posture  Muscle weakness (generalized)  Stiffness of right knee, not elsewhere classified     Problem List Patient Active Problem List   Diagnosis Date Noted   Type 2 diabetes mellitus with diabetic polyneuropathy, with long-term current use of insulin ([name]) 03/24/2021   Hypokalemia    Constipation 02/02/2021   Fecal  impaction ([place]) 02/01/2021   Wound dehiscence 01/09/2021   Dehiscence of amputation stump (HCC)    Status post percutaneous transluminal coronary angioplasty 01/06/2021   Type II diabetes mellitus, uncontrolled ([place]) 01/06/2021   Acute pancreatitis 01/06/2021   Diabetic peripheral vascular disease ([place]) 01/06/2021   Encounter for screening for other disorder 01/06/2021   Enlarged prostate 01/06/2021   Foot ulcer, right ([place]) 01/06/2021   Gout 01/06/2021   Headache 01/06/2021   Neck pain 01/06/2021   Hypoglycemia 01/06/2021   Loss of appetite 01/06/2021   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   Peripheral neuropathy 01/06/2021   Sciatica 01/06/2021   Vitamin B12 deficiency 01/06/2021   Vitamin D deficiency 01/06/2021   Weakness 01/06/2021   Diabetes mellitus type 2 with neurological manifestations ([place]) 01/06/2021   Protein-calorie malnutrition, severe 12/26/2020  Acute blood loss anemia 12/26/2020   Prerenal azotemia 12/26/2020   Below-knee amputation of right lower extremity ([place]) 12/12/2020   Gangrene of right foot ([place])    Diabetic neuropathy ([place]) 11/17/2020   Hyperglycemia due to type 2 diabetes mellitus ([place]) 11/17/2020   Long term (current) use of insulin ([place]) 11/17/2020   Obesity 11/17/2020   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/2019   Hip dislocation, right ([place]) 04/13/2019   Other intervertebral disc degeneration, lumbar region 03/30/2019   CAD (coronary artery disease) 01/30/2019   Tobacco abuse 01/30/2019   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   Burn, foot, second degree, left, initial encounter 06/08/2017   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 [place], [place], [address] Phone: [phone]   Fax:  [phone]  Name: Keith Espinoza MRN: [id number] Date of Birth: 1941/03/13

## 2021-05-27 ENCOUNTER — Ambulatory Visit (INDEPENDENT_AMBULATORY_CARE_PROVIDER_SITE_OTHER): Payer: Medicare Other | Admitting: Physical Therapy

## 2021-05-27 ENCOUNTER — Other Ambulatory Visit: Payer: Self-pay

## 2021-05-27 ENCOUNTER — Encounter: Payer: Self-pay | Admitting: Physical Therapy

## 2021-05-27 DIAGNOSIS — R2681 Unsteadiness on feet: Secondary | ICD-10-CM | POA: Diagnosis not present

## 2021-05-27 DIAGNOSIS — M25661 Stiffness of right knee, not elsewhere classified: Secondary | ICD-10-CM | POA: Diagnosis not present

## 2021-05-27 DIAGNOSIS — R293 Abnormal posture: Secondary | ICD-10-CM | POA: Diagnosis not present

## 2021-05-27 DIAGNOSIS — R2689 Other abnormalities of gait and mobility: Secondary | ICD-10-CM | POA: Diagnosis not present

## 2021-05-27 DIAGNOSIS — M6281 Muscle weakness (generalized): Secondary | ICD-10-CM
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
## 2021-05-28 ENCOUNTER — Ambulatory Visit (INDEPENDENT_AMBULATORY_CARE_PROVIDER_SITE_OTHER): Payer: Medicare Other | Admitting: Physical Therapy

## 2021-05-28 ENCOUNTER — Encounter: Payer: Self-pay | Admitting: Physical Therapy

## 2021-05-28 DIAGNOSIS — R2681 Unsteadiness on feet: Secondary | ICD-10-CM | POA: Diagnosis not present

## 2021-05-28 DIAGNOSIS — M25661 Stiffness of right knee, not elsewhere classified: Secondary | ICD-10-CM

## 2021-05-28 DIAGNOSIS — R293 Abnormal posture: Secondary | ICD-10-CM

## 2021-05-28 DIAGNOSIS — R2689 Other abnormalities of gait and mobility: Secondary | ICD-10-CM | POA: Diagnosis not present

## 2021-05-28 DIAGNOSIS — M6281 Muscle weakness (generalized): Secondary | ICD-10-CM | POA: Diagnosis not present

## 2021-05-28 NOTE — Therapy (Signed)
[facility] Physical Therapy [address] [place], [place], [address] Phone: [phone]   Fax:  [phone]  Physical Therapy Treatment  Patient Details  Name: Keith Espinoza MRN: [id number] Date of Birth: 06/16/1941 Referring Provider (PT): Meridee Score, MD   Encounter Date: 05/28/2021   PT End of Session - 05/28/21 0930     Visit Number 6    Number of Visits 25    Date for PT Re-Evaluation 07/30/21    Authorization Type Medicare A[employer] and [employer]    Progress Note Due on Visit 10    PT Start Time 0930    PT Stop Time 1015    PT Time Calculation (min) 45 min    Equipment Utilized During Treatment Gait belt    Activity Tolerance Patient tolerated treatment well    Behavior During Therapy [facility] for tasks assessed/performed             Past Medical History:  Diagnosis Date   Allergic rhinitis    Allergic rhinitis    Arthritis    Basal cell carcinoma 11/01/2019    bcc left chest treatment TX cx3 29f    Chronic leg pain    right   Chronic lower back pain    Coronary artery disease    a. Stenting to RCA 2004; staged DES to LAD and Cx 2004. DES to mRCA 2012. b. DES to mCx, PTCA to dCx 11/2011. c. Lateral wall MI 2013 s/p PTCA to distal Cx & DES to mid OM2 11/2011. d. Low risk nuc 04/2014, EF wnl.   COVID-19    Diabetes mellitus    Insulin dependent   [place]abetic neuropathy ([place]    MILD   Diverticulosis    Dysrhythmia    GR[place]anna Randysyndrome    Gout    right wrist; right foot; right elbow; have had it since 1970's   H/O hiatal hernia    Heart murmur    History of echocardiogram    aortic sclerosis per echo 12/09 EF 65%, otherwise normal   History of hemorrhoids    BLEEDING   History of kidney stones    h/o   Hypertension    Diagnosed 1995    Myocardial infarction ([facility]    Pancreatic pseudocyst    a. s/p remote drainage 2006.   Thrombocytopenia ([place]    Seen on oldest labs in system from 2004   Vitamin B 12 deficiency    orally replaced     Past Surgical History:  Procedure Laterality Date   ABDOMINAL AORTOGRAM W/LOWER EXTREMITY Bilateral 08/08/2020   Procedure: ABDOMINAL AORTOGRAM W/LOWER EXTREMITY;  Surgeon: DAngelia Mould MD;  [facility];  Service: Cardiovascular;  Laterality: Bilateral;   AMPUTATION Right 12/12/2020   Procedure: RIGHT BELOW KNEE AMPUTATION;  Surgeon: DNewt Minion MD;  [place] [facility]  Service: Orthopedics;  Laterality: Right;   BACK SURGERY     "total of 3 times" S/P fall    CARPAL TUNNEL RELEASE Bilateral    CHOLECYSTECTOMY  1990's   COLONOSCOPY     CORONARY ANGIOPLASTY  11/11/11   CORONARY ANGIOPLASTY WITH STENT PLACEMENT  09/30/2011   "1 then; makes a total of 4"   CORONARY ANGIOPLASTY WITH STENT PLACEMENT  11/11/11   "1; makes a total of 5"   INGUINAL HERNIA REPAIR  2003   right   JOINT REPLACEMENT Right 04/03/2002   hip replacment   KNEE ARTHROSCOPY  1990's   left   LEFT HEART CATHETERIZATION  WITH CORONARY ANGIOGRAM N/A 09/30/2011   Procedure: LEFT HEART CATHETERIZATION WITH CORONARY ANGIOGRAM;  Surgeon: Jettie Booze, MD;  [facility] C[facility] LAB;  Service: Cardiovascular;  Laterality: N/A;  possible PCI   LEFT HEART CATHETERIZATION WITH CORONARY ANGIOGRAM N/A 11/15/2011   Procedure: LEFT HEART CATHETERIZATION WITH CORONARY ANGIOGRAM;  Surgeon: Jettie Booze, MD;  Location: [facility] CATH LAB;  Service: Cardiovascular;  Laterality: N/A;   PERCUTANEOUS CORONARY STENT INTERVENTION (PCI-S)  09/30/2011   Procedure: PERCUTANEOUS CORONARY STENT INTERVENTION (PCI-S);  Surgeon: Jettie Booze, MD;  Location: [facility] CATH LAB;  Service: Cardiovascular;;   PERCUTANEOUS CORONARY STENT INTERVENTION (PCI-S) N/A 11/11/2011   Procedure: PERCUTANEOUS CORONARY STENT INTERVENTION (PCI-S);  Surgeon: Jettie Booze, MD;  Location: [facility] CATH LAB;  Service: Cardiovascular;  Laterality: N/A;   PERIPHERAL VASCULAR BALLOON ANGIOPLASTY Right 08/08/2020   Procedure: PERIPHERAL VASCULAR  BALLOON ANGIOPLASTY;  Surgeon: Angelia Mould, MD;  Location: [place] [facility];  Service: Cardiovascular;  Laterality: Right;  Posterior tibial    SHOULDER SURGERY Right    X 2   STUMP REVISION Right 01/09/2021   Procedure: REVISION RIGHT BELOW KNEE AMPUTATION;  Surgeon: [name], MD;  Location: [place];  Service: Orthopedics;  Laterality: Right;   TONSILLECTOMY  ~ [place] Right 04/13/2019   Procedure: RIGHT TOTAL HIP REVISION-POSTERIOR  APPROACH LATERAL;  Surgeon: Marybelle Killings, MD;  Location: [place];  Service: Orthopedics;  Laterality: Right;   TOTAL KNEE ARTHROPLASTY Left 07/23/2016   Procedure: LEFT TOTAL KNEE ARTHROPLASTY;  Surgeon: Marybelle Killings, MD;  Location: [place];  Service: Orthopedics;  Laterality: Left;    There were no vitals filed for this visit.   Subjective Assessment - 05/28/21 0930     Subjective He mowed without prosthesis only because it was bothering him before.    Patient is accompained by: Family member   granddaughter   Pertinent History Rt TTA, arthritis, right hip replacement, left knee arthroplasty, shoulder sg X2, gout, basal cell CA, LBP, back sg X3, IDDM, neuropathy, diverticulosis, heart murmur, HTN, MI,    Limitations Standing;House hold activities;Walking    Patient Stated Goals walk in community & on farm, get on tractor, yard work, drive    Currently in Pain? Yes    Pain Score 3    up to 5-6/10   Pain Location Leg   residual limb   Pain Orientation Right;Anterior    Pain Descriptors / Indicators Aching;Sore    Pain Type Other (Comment)   prosthetic   Pain Onset More than a month ago    Pain Frequency Intermittent    Aggravating Factors  bending knee    Pain Relieving Factors strightening knee                               [facility] Adult PT Treatment/Exercise - 05/28/21 0930       Transfers   Transfers Sit to Stand;Stand to Sit    Sit to Stand 5: Supervision;With upper extremity assist;With  armrests;From chair/3-in-1;Other (comment)   requires RW to stabilize   Stand to Sit 5: Supervision;With upper extremity assist;With armrests;To chair/3-in-1;Other (comment)   requires RW for stability     Ambulation/Gait   Ambulation/Gait --    Ambulation/Gait Assistance --    Ambulation Distance (Feet) --    Assistive device --      Therapeutic Activites    Therapeutic Activities --  Other Therapeutic Activities --      Neuro Re-ed    Neuro Re-ed Details  standing without UE support with PT min guard- green theraband alternating & BUEs 10 reps ea of forward reach, rows & upward lift.      Knee/Hip Exercises: Aerobic   Nustep seat 10 level 5 with BLEs & BUEs for 5 min      Knee/Hip Exercises: Standing   Rocker Board 1 minute   ant/level/post & right/level/left, BUE support cues for light WB   Rocker Board Limitations demo & verbal cues on technique      Knee/Hip Exercises: Seated   Sit to Sand 10 reps;without UE support   from 24" bar stool, demo & verbal cues on proper technique     Prosthetics   Prosthetic Care Comments  Continue wear to up to 3hrs on or pain increases too much to tolerate, 1-2 hours off rotating through most of awake hours.   Continue cutoff Vivewear shrinker under liner in direct contact with skin.    Current prosthetic wear tolerance (days/week)  daily    Current prosthetic wear tolerance (#hours/day)  6-8 hours total with 2hrs at time    Current prosthetic weight-bearing tolerance (hours/day)  Patient tolerated 5 minutes of standing & gait activities with partial weight on prosthesis with "mild" limb discomfort.    Edema pitting edema    Residual limb condition  both scabs have come off wounds. Distal wound with complete granulation closure. Tibial crest wound has granualtion forming with no signs of infection.  color & temperature normal. minimal to no hair growth. cylinderical shape.    Education Provided Skin check;Residual limb care;Proper wear  schedule/adjustment;Other (comment)   see prosthetic care comments.   Person(s) Educated Patient;Child(ren)    Education Method Explanation;Verbal cues    Education Method Verbalized understanding;Verbal cues required;Needs further instruction    Donning Prosthesis Modified independent (device/increased time)                 Balance Exercises - 05/28/21 0930       Balance Exercises: Standing   Standing Eyes Opened Wide ([place]);Solid surface;Foam/compliant surface;Head turns;5 reps   4 directions,  solid min guard & foam minA   Standing Eyes Opened Limitations tactile, visual & verbal cues on equal weight bearing & balance reactions.    Standing Eyes Closed Wide (BOA);Head turns;Solid surface;5 reps   4 directions, minA   Standing Eyes Closed Limitations tactile, visual (reset bw directions) & verbal cues on equal weight bearing & balance reactions.                 PT Short Term Goals - 05/18/21 1023       PT SHORT TERM GOAL #1   Title Patient donnes prosthesis modified independent & verbalizes proper cleaning    Time 4    Period Weeks    Status On-going    Target Date 06/04/21      PT SHORT TERM GOAL #2   Title Patient tolerates prosthesis >8 hrs total /day without increase in skin issues    Time 4    Period Weeks    Status On-going    Target Date 06/04/21      PT SHORT TERM GOAL #3   Title Patient able to reach 7" and look over both shoulders without UE support with supervision.    Time 4    Period Weeks    Status On-going    Target Date 06/04/21  PT SHORT TERM GOAL #4   Title Patient ambulates 200' with RW & prosthesis with supervision & no loss of balance.    Time 4    Period Weeks    Status On-going    Target Date 06/04/21      PT SHORT TERM GOAL #5   Title Patient negotiates ramps & curbs with RW & prosthesis with supervision.    Time 4    Period Weeks    Status On-going    Target Date 06/04/21               PT Long Term Goals -  05/18/21 1023       PT LONG TERM GOAL #1   Title Patient demonstrates & verbalized understanding of prosthetic care to enable safe utilization of prosthesis.    Time 12    Period Weeks    Status On-going    Target Date 07/30/21      PT LONG TERM GOAL #2   Title Patient tolerates prosthesis wear >90% of awake hours without skin or limb pain issues.    Time 12    Period Weeks    Status On-going    Target Date 07/30/21      PT LONG TERM GOAL #3   Title Berg Balance >/= 36/56 to indicate lower fall risk    Time 12    Period Weeks    Status On-going    Target Date 07/30/21      PT LONG TERM GOAL #4   Title Patient ambulates 300' with LRAD & prosthesis modified independent    Time 12    Period Weeks    Status On-going    Target Date 07/30/21      PT LONG TERM GOAL #5   Title Patient negotiates ramps, curbs & stairs with LRAD & prosthesis modified independent.    Time 12    Period Weeks    Status On-going    Target Date 07/30/21                   Plan - 05/28/21 0930     Clinical Impression Statement Patient's wounds appear to be healing.  PT worked on balance activities to facilitate ankle/residual limb & hip strategies which improved with repetition.    Personal Factors and Comorbidities Comorbidity 3+;Age;Time since onset of injury/illness/exacerbation    Comorbidities Rt TTA, arthritis, right hip replacement, left knee arthroplasty, shoulder sg X2, gout, basal cell CA, LBP, back sg X3, IDDM, neuropathy, diverticulosis, heart murmur, HTN, MI,    Examination-Activity Limitations Lift;Locomotion Level;Squat;Stairs;Stand;Transfers    Examination-Participation Restrictions Community Activity;Yard Work    [employer] Evolving/Moderate complexity    Rehab Potential Good    PT Frequency 2x / week    PT Duration 12 weeks    PT Treatment/Interventions ADLs/Self Care Home Management;DME Instruction;Gait training;Stair training;Functional mobility  training;Therapeutic activities;Therapeutic exercise;Balance training;Neuromuscular re-education;Patient/family education;Prosthetic Training;Vestibular;Passive range of motion    PT Next Visit Plan check STGs next week, standing balance,  leg press, progress prosthetic gait with RW including ramps & curbs,  check wounds    Consulted and Agree with Plan of Care Patient             Patient will benefit from skilled therapeutic intervention in order to improve the following deficits and impairments:  Abnormal gait, Decreased activity tolerance, Decreased balance, Decreased endurance, Decreased knowledge of use of DME, Decreased mobility, Decreased range of motion, Decreased skin integrity, Decreased scar mobility, Decreased strength,  Increased edema, Impaired flexibility, Postural dysfunction, Prosthetic Dependency  Visit Diagnosis: Other abnormalities of gait and mobility  Unsteadiness on feet  Abnormal posture  Muscle weakness (generalized)  Stiffness of right knee, not elsewhere classified     Problem List Patient Active Problem List   Diagnosis Date Noted   Type 2 diabetes mellitus with diabetic polyneuropathy, with long-term current use of insulin ([place]) 03/24/2021   Hypokalemia    Constipation 02/02/2021   Fecal impaction ([place]) 02/01/2021   Wound dehiscence 01/09/2021   Dehiscence of amputation stump ([place])    Status post percutaneous transluminal coronary angioplasty 01/06/2021   Type II diabetes mellitus, uncontrolled ([place]) 01/06/2021   Acute pancreatitis 01/06/2021   Diabetic peripheral vascular disease ([place]) 01/06/2021   Encounter for screening for other disorder 01/06/2021   Enlarged prostate 01/06/2021   Foot ulcer, right ([place]) 01/06/2021   Gout 01/06/2021   Headache 01/06/2021   Neck pain 01/06/2021   Hypoglycemia 01/06/2021   Loss of appetite 01/06/2021   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   Peripheral neuropathy 01/06/2021   Sciatica 01/06/2021    Vitamin B12 deficiency 01/06/2021   Vitamin D deficiency 01/06/2021   Weakness 01/06/2021   Diabetes mellitus type 2 with neurological manifestations ([place]) 01/06/2021   Protein-calorie malnutrition, severe 12/26/2020   Acute blood loss anemia 12/26/2020   Prerenal azotemia 12/26/2020   Below-knee amputation of right lower extremity ([place]) 12/12/2020   Gangrene of right foot ([place])    Diabetic neuropathy ([place]) 11/17/2020   Hyperglycemia due to type 2 diabetes mellitus ([place]) 11/17/2020   Long term (current) use of insulin ([place]) 11/17/2020   Obesity 11/17/2020   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/2019   Hip dislocation, right ([place]) 04/13/2019   Other intervertebral disc degeneration, lumbar region 03/30/2019   CAD (coronary artery disease) 01/30/2019   Tobacco abuse 01/30/2019   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   Burn, foot, second degree, left, initial encounter 06/08/2017   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 03/16/2017   S/P total knee arthroplasty, left 10/26/2016   Hyperlipidemia 09/04/2014   Thrombocytopenia ([facility])    Precordial chest pain 04/05/2014   Coronary atherosclerosis of native coronary artery 10/01/2013   Other and unspecified hyperlipidemia 10/01/2013   Primary hypertension 10/01/2013   Diabetes mellitus ([place]) 10/01/2013   Esophageal reflux 10/01/2013   Hypertrophy of prostate without urinary obstruction and other lower urinary tract symptoms (LUTS) 10/01/2013    Jamey Reas, PT, DPT 05/28/2021, 10:56 AM  [facility] [address] [place], [place], [address] Phone: [phone]   Fax:  [phone]  Name: Keith Espinoza MRN: [id number] Date of Birth: 06/04/1941

## 2021-06-01 ENCOUNTER — Other Ambulatory Visit: Payer: Self-pay

## 2021-06-01 ENCOUNTER — Encounter (INDEPENDENT_AMBULATORY_CARE_PROVIDER_SITE_OTHER): Payer: Medicare Other | Admitting: Ophthalmology

## 2021-06-01 ENCOUNTER — Encounter (INDEPENDENT_AMBULATORY_CARE_PROVIDER_SITE_OTHER): Payer: Self-pay | Admitting: Ophthalmology

## 2021-06-01 ENCOUNTER — Ambulatory Visit (INDEPENDENT_AMBULATORY_CARE_PROVIDER_SITE_OTHER): Payer: Medicare Other | Admitting: Physical Therapy

## 2021-06-01 ENCOUNTER — Ambulatory Visit (INDEPENDENT_AMBULATORY_CARE_PROVIDER_SITE_OTHER): Payer: Medicare Other | Admitting: Ophthalmology

## 2021-06-01 ENCOUNTER — Encounter: Payer: Self-pay | Admitting: Physical Therapy

## 2021-06-01 DIAGNOSIS — N400 Enlarged prostate without lower urinary tract symptoms: Secondary | ICD-10-CM | POA: Diagnosis not present

## 2021-06-01 DIAGNOSIS — H353211 Exudative age-related macular degeneration, right eye, with active choroidal neovascularization: Secondary | ICD-10-CM | POA: Diagnosis not present

## 2021-06-01 DIAGNOSIS — H353132 Nonexudative age-related macular degeneration, bilateral, intermediate dry stage: Secondary | ICD-10-CM | POA: Insufficient documentation

## 2021-06-01 DIAGNOSIS — M25661 Stiffness of right knee, not elsewhere classified: Secondary | ICD-10-CM | POA: Diagnosis not present

## 2021-06-01 DIAGNOSIS — I10 Essential (primary) hypertension: Secondary | ICD-10-CM | POA: Diagnosis not present

## 2021-06-01 DIAGNOSIS — R293 Abnormal posture: Secondary | ICD-10-CM

## 2021-06-01 DIAGNOSIS — M6281 Muscle weakness (generalized): Secondary | ICD-10-CM

## 2021-06-01 DIAGNOSIS — Z794 Long term (current) use of insulin: Secondary | ICD-10-CM | POA: Diagnosis not present

## 2021-06-01 DIAGNOSIS — L97909 Non-pressure chronic ulcer of unspecified part of unspecified lower leg with unspecified severity: Secondary | ICD-10-CM | POA: Diagnosis not present

## 2021-06-01 DIAGNOSIS — I70299 Other atherosclerosis of native arteries of extremities, unspecified extremity: Secondary | ICD-10-CM | POA: Diagnosis not present

## 2021-06-01 DIAGNOSIS — K219 Gastro-esophageal reflux disease without esophagitis: Secondary | ICD-10-CM | POA: Diagnosis not present

## 2021-06-01 DIAGNOSIS — R2681 Unsteadiness on feet: Secondary | ICD-10-CM | POA: Diagnosis not present

## 2021-06-01 DIAGNOSIS — E1140 Type 2 diabetes mellitus with diabetic neuropathy, unspecified: Secondary | ICD-10-CM | POA: Diagnosis not present

## 2021-06-01 DIAGNOSIS — Z7984 Long term (current) use of oral hypoglycemic drugs: Secondary | ICD-10-CM | POA: Diagnosis not present

## 2021-06-01 DIAGNOSIS — E785 Hyperlipidemia, unspecified: Secondary | ICD-10-CM | POA: Diagnosis not present

## 2021-06-01 DIAGNOSIS — R2689 Other abnormalities of gait and mobility: Secondary | ICD-10-CM

## 2021-06-01 DIAGNOSIS — E1142 Type 2 diabetes mellitus with diabetic polyneuropathy: Secondary | ICD-10-CM | POA: Diagnosis not present

## 2021-06-01 DIAGNOSIS — I25118 Atherosclerotic heart disease of native coronary artery with other forms of angina pectoris: Secondary | ICD-10-CM | POA: Diagnosis not present

## 2021-06-01 DIAGNOSIS — E1165 Type 2 diabetes mellitus with hyperglycemia: Secondary | ICD-10-CM | POA: Diagnosis not present

## 2021-06-01 DIAGNOSIS — N401 Enlarged prostate with lower urinary tract symptoms: Secondary | ICD-10-CM | POA: Diagnosis not present

## 2021-06-01 DIAGNOSIS — E1151 Type 2 diabetes mellitus with diabetic peripheral angiopathy without gangrene: Secondary | ICD-10-CM | POA: Diagnosis not present

## 2021-06-01 MED ORDER — BEVACIZUMAB 2.5 MG/0.1ML IZ SOSY
2.5000 mg | PREFILLED_SYRINGE | INTRAVITREAL | Status: AC | PRN
  Administered 2021-06-01: 2.5 mg via INTRAVITREAL

## 2021-06-01 NOTE — Therapy (Signed)
[facility] [address] [place], [place], [address] Phone: [phone]   Fax:  [phone]  Physical Therapy Treatment  Patient Details  Name: Keith Espinoza MRN: [id number] Date of Birth: [birth year] Referring Provider (PT): Meridee Score, MD   Encounter Date: 06/01/2021   PT End of Session - 06/01/21 0845     Visit Number 7    Number of Visits 25    Date for PT Re-Evaluation 07/30/21    Authorization Type Medicare A&B and [employer]    Progress Note Due on Visit 10    PT Start Time 0845    PT Stop Time 0930    PT Time Calculation (min) 45 min    Equipment Utilized During Treatment Gait belt    Activity Tolerance Patient tolerated treatment well    Behavior During Therapy [facility] for tasks assessed/performed             Past Medical History:  Diagnosis Date   Allergic rhinitis    Allergic rhinitis    Arthritis    Basal cell carcinoma 11/01/2019    bcc left chest treatment TX cx3 36f    Chronic leg pain    right   Chronic lower back pain    Coronary artery disease    a. Stenting to RCA 2004; staged DES to LAD and Cx 2004. DES to mRCA 2012. b. DES to mCx, PTCA to dCx 11/2011. c. Lateral wall MI 2013 s/p PTCA to distal Cx & DES to mid OM2 11/2011. d. Low risk nuc 04/2014, EF wnl.   COVID-19    Diabetes mellitus    Insulin dependent   Diabetic neuropathy ([facility]    MILD   Diverticulosis    Dysrhythmia    GRosanna Randysyndrome    Gout    right wrist; right foot; right elbow; have had it since 1970's   H/O hiatal hernia    Heart murmur    History of echocardiogram    aortic sclerosis per echo 12/09 EF 65%, otherwise normal   History of hemorrhoids    BLEEDING   History of kidney stones    h/o   Hypertension    Diagnosed 1995    Myocardial infarction ([facility]    Pancreatic pseudocyst    a. s/p remote drainage 2006.   Thrombocytopenia ([facility]    Seen on oldest labs in system from 2004   Vitamin B 12 deficiency    orally replaced     Past Surgical History:  Procedure Laterality Date   ABDOMINAL AORTOGRAM W/LOWER EXTREMITY Bilateral 08/08/2020   Procedure: ABDOMINAL AORTOGRAM W/LOWER EXTREMITY;  Surgeon: DAngelia Mould MD;  [facility];  Service: Cardiovascular;  Laterality: Bilateral;   AMPUTATION Right 12/12/2020   Procedure: RIGHT BELOW KNEE AMPUTATION;  Surgeon: DNewt Minion MD;  [facility]  Service: Orthopedics;  Laterality: Right;   BACK SURGERY     "total of 3 times" S/P fall    CARPAL TUNNEL RELEASE Bilateral    CHOLECYSTECTOMY  1990's   COLONOSCOPY     CORONARY ANGIOPLASTY  11/11/11   CORONARY ANGIOPLASTY WITH STENT PLACEMENT  09/30/2011   "1 then; makes a total of 4"   CORONARY ANGIOPLASTY WITH STENT PLACEMENT  11/11/11   "1; makes a total of 5"   INGUINAL HERNIA REPAIR  2003   right   JOINT REPLACEMENT Right 04/03/2002   hip replacment   KNEE ARTHROSCOPY  1990's   left   LEFT HEART CATHETERIZATION  WITH CORONARY ANGIOGRAM N/A 09/30/2011   Procedure: LEFT HEART CATHETERIZATION WITH CORONARY ANGIOGRAM;  Surgeon: Jettie Booze, MD;  Location: [facility] CATH LAB;  Service: Cardiovascular;  Laterality: N/A;  possible PCI   LEFT HEART CATHETERIZATION WITH CORONARY ANGIOGRAM N/A 11/15/2011   Procedure: LEFT HEART CATHETERIZATION WITH CORONARY ANGIOGRAM;  Surgeon: Jettie Booze, MD;  Location: [facility] CATH LAB;  Service: Cardiovascular;  Laterality: N/A;   PERCUTANEOUS CORONARY STENT INTERVENTION (PCI-S)  09/30/2011   Procedure: PERCUTANEOUS CORONARY STENT INTERVENTION (PCI-S);  Surgeon: Jettie Booze, MD;  Location: [facility] CATH LAB;  Service: Cardiovascular;;   PERCUTANEOUS CORONARY STENT INTERVENTION (PCI-S) N/A 11/11/2011   Procedure: PERCUTANEOUS CORONARY STENT INTERVENTION (PCI-S);  Surgeon: Jettie Booze, MD;  Location: [facility] CATH LAB;  Service: Cardiovascular;  Laterality: N/A;   PERIPHERAL VASCULAR BALLOON ANGIOPLASTY Right 08/08/2020   Procedure: PERIPHERAL VASCULAR  BALLOON ANGIOPLASTY;  Surgeon: Angelia Mould, MD;  Location: [place] [facility];  Service: Cardiovascular;  Laterality: Right;  Posterior tibial    SHOULDER SURGERY Right    X 2   STUMP REVISION Right 01/09/2021   Procedure: REVISION RIGHT BELOW KNEE AMPUTATION;  Surgeon: [name], MD;  Location: [facility];  Service: Orthopedics;  Laterality: Right;   TONSILLECTOMY  ~ [place] Right 04/13/2019   Procedure: RIGHT TOTAL HIP REVISION-POSTERIOR  APPROACH LATERAL;  Surgeon: Marybelle Killings, MD;  Location: [place];  Service: Orthopedics;  Laterality: Right;   TOTAL KNEE ARTHROPLASTY Left 07/23/2016   Procedure: LEFT TOTAL KNEE ARTHROPLASTY;  Surgeon: Marybelle Killings, MD;  Location: [place];  Service: Orthopedics;  Laterality: Left;    There were no vitals filed for this visit.   Subjective Assessment - 06/01/21 0845     Subjective His pain increased after using Nustep machine last session.  He continues to limit wear based on pain at wound on tibial crest.  He feels the silicon liner pulls on liner causing pain as pain goes away as soon as he removes liner.    Patient is accompained by: Family member   granddaughter   Pertinent History Rt TTA, arthritis, right hip replacement, left knee arthroplasty, shoulder sg X2, gout, basal cell CA, LBP, back sg X3, IDDM, neuropathy, diverticulosis, heart murmur, HTN, MI,    Limitations Standing;House hold activities;Walking    Patient Stated Goals walk in community & on farm, get on tractor, yard work, drive    Currently in Pain? Yes    Pain Score 5     Pain Location Leg   residual limb   Pain Orientation Right;Anterior    Pain Descriptors / Indicators Burning;Sharp    Pain Type Other (Comment)   wound & prosthetic   Pain Onset More than a month ago    Pain Frequency Intermittent    Aggravating Factors  prosthetic liner wear    Pain Relieving Factors removing liner                               [facility] Adult PT  Treatment/Exercise - 06/01/21 0846       Transfers   Transfers Sit to Stand;Stand to Sit    Sit to Stand 5: Supervision;With upper extremity assist;With armrests;From chair/3-in-1;Other (comment)   requires RW to stabilize   Stand to Sit 5: Supervision;With upper extremity assist;With armrests;To chair/3-in-1;Other (comment)   requires RW for stability     Neuro Re-ed    Neuro  Re-ed Details  --      Knee/Hip Exercises: Aerobic   Nustep --      Knee/Hip Exercises: Standing   Rocker Board --    Rocker Board Limitations --      Knee/Hip Exercises: Seated   Sit to [employer] --      Prosthetics   Prosthetic Care Comments  Continue wear to up to 3hrs on or pain increases too much to tolerate, 1-2 hours off rotating through most of awake hours.   Continue cutoff Vivewear shrinker under liner in direct contact with skin.  PT demo & verbal cues on positioning prosthesis with foot positioned on stool. It seems to remove sheer force on liner from anterior with wound to posterior.  Pt reports wound pain down to 1-2/10.  Pt & dtr verbalize understanding of positioning recommendations.    Current prosthetic wear tolerance (days/week)  daily    Current prosthetic wear tolerance (#hours/day)  6-8 hours total with 2hrs at time Once wore 3hrs at one time.    Current prosthetic weight-bearing tolerance (hours/day)  Patient tolerated 5 minutes of standing & gait activities with partial weight on prosthesis with wound /  limb discomfort.    Edema pitting edema    Residual limb condition  both scabs have come off wounds. Distal wound with complete granulation closure. Tibial crest wound has granualtion forming with no signs of infection.  color & temperature normal. minimal to no hair growth. cylinderical shape.    Education Provided Skin check;Residual limb care;Proper wear schedule/adjustment;Other (comment)   see prosthetic care comments.   Person(s) Educated Patient;Child(ren)    Education Method  Explanation;Tactile cues    Education Method Verbalized understanding;Verbal cues required;Needs further instruction    Donning Prosthesis Modified independent (device/increased time)                 Balance Exercises - 06/01/21 0846       Balance Exercises: Standing   Standing Eyes Opened Wide (BOA);Solid surface;Foam/compliant surface;Head turns;5 reps   4 directions,  solid min guard & foam minA   Standing Eyes Opened Limitations tactile, visual & verbal cues on equal weight bearing & balance reactions.    Standing Eyes Closed Wide (BOA);Head turns;Solid surface;5 reps   4 directions, minA   Standing Eyes Closed Limitations tactile, visual (reset bw directions) & verbal cues on equal weight bearing & balance reactions.                 PT Short Term Goals - 05/18/21 1023       PT SHORT TERM GOAL #1   Title Patient donnes prosthesis modified independent & verbalizes proper cleaning    Time 4    Period Weeks    Status On-going    Target Date 06/04/21      PT SHORT TERM GOAL #2   Title Patient tolerates prosthesis >8 hrs total /day without increase in skin issues    Time 4    Period Weeks    Status On-going    Target Date 06/04/21      PT SHORT TERM GOAL #3   Title Patient able to reach 7" and look over both shoulders without UE support with supervision.    Time 4    Period Weeks    Status On-going    Target Date 06/04/21      PT SHORT TERM GOAL #4   Title Patient ambulates 200' with RW & prosthesis with supervision & no loss of balance.  Time 4    Period Weeks    Status On-going    Target Date 06/04/21      PT SHORT TERM GOAL #5   Title Patient negotiates ramps & curbs with RW & prosthesis with supervision.    Time 4    Period Weeks    Status On-going    Target Date 06/04/21               PT Long Term Goals - 05/18/21 1023       PT LONG TERM GOAL #1   Title Patient demonstrates & verbalized understanding of prosthetic care to enable  safe utilization of prosthesis.    Time 12    Period Weeks    Status On-going    Target Date 07/30/21      PT LONG TERM GOAL #2   Title Patient tolerates prosthesis wear >90% of awake hours without skin or limb pain issues.    Time 12    Period Weeks    Status On-going    Target Date 07/30/21      PT LONG TERM GOAL #3   Title Berg Balance >/= 36/56 to indicate lower fall risk    Time 12    Period Weeks    Status On-going    Target Date 07/30/21      PT LONG TERM GOAL #4   Title Patient ambulates 300' with LRAD & prosthesis modified independent    Time 12    Period Weeks    Status On-going    Target Date 07/30/21      PT LONG TERM GOAL #5   Title Patient negotiates ramps, curbs & stairs with LRAD & prosthesis modified independent.    Time 12    Period Weeks    Status On-going    Target Date 07/30/21                   Plan - 06/01/21 0845     Clinical Impression Statement PT session focused on educating patient on positioning to reduce wound pain on residual limb.  Positioning with prosthetic foot elevated seemed to reduce pain from 5/10 to 1-2/10.    Personal Factors and Comorbidities Comorbidity 3+;Age;Time since onset of injury/illness/exacerbation    Comorbidities Rt TTA, arthritis, right hip replacement, left knee arthroplasty, shoulder sg X2, gout, basal cell CA, LBP, back sg X3, IDDM, neuropathy, diverticulosis, heart murmur, HTN, MI,    Examination-Activity Limitations Lift;Locomotion Level;Squat;Stairs;Stand;Transfers    Examination-Participation Restrictions Community Activity;Yard Work    [employer] officer Evolving/Moderate complexity    Rehab Potential Good    PT Frequency 2x / week    PT Duration 12 weeks    PT Treatment/Interventions ADLs/Self Care Home Management;DME Instruction;Gait training;Stair training;Functional mobility training;Therapeutic activities;Therapeutic exercise;Balance training;Neuromuscular  re-education;Patient/family education;Prosthetic Training;Vestibular;Passive range of motion    PT Next Visit Plan check STGs next visit, standing balance,  leg press, progress prosthetic gait with RW including ramps & curbs,  check wounds    Consulted and Agree with Plan of Care Patient             Patient will benefit from skilled therapeutic intervention in order to improve the following deficits and impairments:  Abnormal gait, Decreased activity tolerance, Decreased balance, Decreased endurance, Decreased knowledge of use of DME, Decreased mobility, Decreased range of motion, Decreased skin integrity, Decreased scar mobility, Decreased strength, Increased edema, Impaired flexibility, Postural dysfunction, Prosthetic Dependency  Visit Diagnosis: Other abnormalities of gait and mobility  Unsteadiness  on feet  Abnormal posture  Muscle weakness (generalized)  Stiffness of right knee, not elsewhere classified     Problem List Patient Active Problem List   Diagnosis Date Noted   Type 2 diabetes mellitus with diabetic polyneuropathy, with long-term current use of insulin ([place]) 03/24/2021   Hypokalemia    Constipation 02/02/2021   Fecal impaction ([place]) 02/01/2021   Wound dehiscence 01/09/2021   Dehiscence of amputation stump ([place])    Status post percutaneous transluminal coronary angioplasty 01/06/2021   Type II diabetes mellitus, uncontrolled ([place]) 01/06/2021   Acute pancreatitis 01/06/2021   Diabetic peripheral vascular disease ([place]) 01/06/2021   Encounter for screening for other disorder 01/06/2021   Enlarged prostate 01/06/2021   Foot ulcer, right ([place]) 01/06/2021   Gout 01/06/2021   Headache 01/06/2021   Neck pain 01/06/2021   Hypoglycemia 01/06/2021   Loss of appetite 01/06/2021   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   Peripheral neuropathy 01/06/2021   Sciatica 01/06/2021   Vitamin B12 deficiency 01/06/2021   Vitamin D deficiency 01/06/2021   Weakness  01/06/2021   Diabetes mellitus type 2 with neurological manifestations ([place]) 01/06/2021   Protein-calorie malnutrition, severe 12/26/2020   Acute blood loss anemia 12/26/2020   Prerenal azotemia 12/26/2020   Below-knee amputation of right lower extremity ([place]) 12/12/2020   Gangrene of right foot ([place])    Diabetic neuropathy ([place]) 11/17/2020   Hyperglycemia due to type 2 diabetes mellitus ([place]) 11/17/2020   Long term (current) use of insulin ([place]) 11/17/2020   Obesity 11/17/2020   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/2019   Hip dislocation, right ([place]) 04/13/2019   Other intervertebral disc degeneration, lumbar region 03/30/2019   CAD (coronary artery disease) 01/30/2019   Tobacco abuse 01/30/2019   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   Burn, foot, second degree, left, initial encounter 06/08/2017   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 03/16/2017   S/P total knee arthroplasty, left 10/26/2016   Hyperlipidemia 09/04/2014   Thrombocytopenia ([facility])    Precordial chest pain 04/05/2014   Coronary atherosclerosis of native coronary artery 10/01/2013   Other and unspecified hyperlipidemia 10/01/2013   Primary hypertension 10/01/2013   Diabetes mellitus ([place]) 10/01/2013   Esophageal reflux 10/01/2013   Hypertrophy of prostate without urinary obstruction and other lower urinary tract symptoms (LUTS) 10/01/2013    Jamey Reas, PT, DPT 06/01/2021, 10:15 AM  [facility] Physical Therapy [address] [place], [place], [address] Phone: [phone]   Fax:  [phone]  Name: THEODOROS HENIGE MRN: [id number] Date of Birth: 10-15-1940

## 2021-06-01 NOTE — Assessment & Plan Note (Signed)
No detectable diabetic retinopathy 

## 2021-06-01 NOTE — Progress Notes (Signed)
06/01/2021     CHIEF COMPLAINT Patient presents for  Chief Complaint  Patient presents with   Retina Evaluation      HISTORY OF PRESENT ILLNESS: Keith Espinoza is a 80 y.o. male who presents to the clinic today for:   HPI     Retina Evaluation   In both eyes.        Comments   Pt presents for new vision problems-last seen 2019  Pt c/o decreased distance and near vision, left eye is worse, present for ~ 1 year. Pt's daughter reports he was cutting the grass at his home and was missing very lage portions. Pt denies any floaters or flashes of light. Pt denies any pain in or around the eye.  Type 2 Diabetic. Diagnosed for ~ 20 years. A1c: "somewhere around 7," per daughter LBS: 35 (this am)      Last edited by Reather Littler, COA on 06/01/2021 10:45 AM.      Referring physician: [name], MD [address] [place],  [place] [address]  HISTORICAL INFORMATION:   Selected notes from the MEDICAL RECORD NUMBER    Lab Results  Component Value Date   HGBA1C 7.5 (H) 02/01/2021     CURRENT MEDICATIONS: No current outpatient medications on file. (Ophthalmic Drugs)   No current facility-administered medications for this visit. (Ophthalmic Drugs)   Current Outpatient Medications (Other)  Medication Sig   acetaminophen (TYLENOL) 500 MG tablet Take 1,000 mg by mouth every 6 (six) hours as needed (pain).   allopurinol (ZYLOPRIM) 300 MG tablet Take 300 mg by mouth daily.   amLODipine (NORVASC) 5 MG tablet Take 1 tablet (5 mg total) by mouth daily.   aspirin EC 81 MG tablet Take 81 mg by mouth daily.   Cholecalciferol (VITAMIN D3) 50 MCG (2000 UT) TABS Take 2,000 Units by mouth daily.    clopidogrel (PLAVIX) 75 MG tablet Take 1 tablet (75 mg total) by mouth daily.   Continuous Blood Gluc Receiver (DEXCOM G6 RECEIVER) DEVI 1 Device by Does not apply route as directed.   Continuous Blood Gluc Sensor (DEXCOM G6 SENSOR) MISC 1 Device by Does not apply route  as directed.   Continuous Blood Gluc Transmit (DEXCOM G6 TRANSMITTER) MISC 1 Device by Does not apply route as directed.   Cyanocobalamin (B-12) 2500 MCG TABS Take 2,500 mcg by mouth daily.   empagliflozin (JARDIANCE) 25 MG TABS tablet Take 1 tablet (25 mg total) by mouth daily.   gabapentin (NEURONTIN) 100 MG capsule Take 2 capsules (200 mg total) by mouth at bedtime. (Patient taking differently: Take 600 mg by mouth at bedtime.)   gabapentin (NEURONTIN) 400 MG capsule Take 1 capsule (400 mg total) by mouth 3 (three) times daily. (Patient taking differently: Take 400 mg by mouth daily.)   hydrochlorothiazide (HYDRODIURIL) 12.5 MG tablet Take 1 tablet (12.5 mg total) by mouth daily.   insulin detemir (LEVEMIR FLEXTOUCH) 100 UNIT/ML FlexPen Inject 8 Units into the skin daily. Dose is based on CBG   Insulin Pen Needle 32G X 4 MM MISC 1 Device by Does not apply route daily in the afternoon.   isosorbide mononitrate (IMDUR) 30 MG 24 hr tablet Take 1 tablet (30 mg total) by mouth daily. Please schedule yearly appointment for future refills. Thank you   linaclotide (LINZESS) 145 MCG CAPS capsule Take 1 capsule (145 mcg total) by mouth daily before breakfast.   losartan (COZAAR) 50 MG tablet Take 1 tablet (50 mg  total) by mouth daily.   metFORMIN (GLUCOPHAGE) 1000 MG tablet Take 1 tablet (1,000 mg total) by mouth 2 (two) times daily.   methocarbamol (ROBAXIN) 500 MG tablet Take 1 tablet (500 mg total) by mouth every 6 (six) hours as needed for muscle spasms.   nitroGLYCERIN (NITROSTAT) 0.4 MG SL tablet Place 0.4 mg under the tongue every 5 (five) minutes x 3 doses as needed for chest pain.    ONE TOUCH ULTRA TEST test strip CHECK BLOOD SUGAR ONCE DAILY AS DIRECTED   oxyCODONE (OXY IR/ROXICODONE) 5 MG immediate release tablet Take 1 tablet (5 mg total) by mouth every 4 (four) hours as needed for severe pain.   polyethylene glycol (MIRALAX) 17 g packet Take 17 g by mouth 2 (two) times daily.   potassium  chloride SA (KLOR-CON) 20 MEQ tablet Take 1 tablet (20 mEq total) by mouth daily.   senna-docusate (SENOKOT-S) 8.6-50 MG tablet Take 1 tablet by mouth at bedtime.   terazosin (HYTRIN) 5 MG capsule Take 1 capsule (5 mg total) by mouth at bedtime.   traMADol (ULTRAM) 50 MG tablet Take 1 tablet (50 mg total) by mouth every 6 (six) hours as needed for moderate pain.   traZODone (DESYREL) 50 MG tablet Take 1 tablet (50 mg total) by mouth at bedtime as needed for sleep. (Patient taking differently: Take 50 mg by mouth 2 (two) times daily.)   No current facility-administered medications for this visit. (Other)      REVIEW OF SYSTEMS:    ALLERGIES Allergies  Allergen Reactions   Simvastatin Other (See Comments)    SEVERE MYALGIAS    Zetia [Ezetimibe] Other (See Comments)    MYALGIAS   Dilaudid [Hydromorphone Hcl] Other (See Comments)    hallucination    PAST MEDICAL HISTORY Past Medical History:  Diagnosis Date   Allergic rhinitis    Allergic rhinitis    Arthritis    Basal cell carcinoma 11/01/2019    bcc left chest treatment TX cx3 81f    Chronic leg pain    right   Chronic lower back pain    Coronary artery disease    a. Stenting to RCA 2004; staged DES to LAD and Cx 2004. DES to mRCA 2012. b. DES to mCx, PTCA to dCx 11/2011. c. Lateral wall MI 2013 s/p PTCA to distal Cx & DES to mid OM2 11/2011. d. Low risk nuc 04/2014, EF wnl.   COVID-19    Diabetes mellitus    Insulin dependent   [place]abetic neuropathy ([place]    MILD   Diverticulosis    Dysrhythmia    GR[place]anna Randysyndrome    Gout    right wrist; right foot; right elbow; have had it since 1970's   H/O hiatal hernia    Heart murmur    History of echocardiogram    aortic sclerosis per echo 12/09 EF 65%, otherwise normal   History of hemorrhoids    BLEEDING   History of kidney stones    h/o   Hypertension    Diagnosed 1995    Myocardial infarction ([facility]    Pancreatic pseudocyst    a. s/p remote drainage 2006.    [place]rombocyt[place]    Seen on oldest labs in system from 2004   Vitamin B 12 deficiency    orally replaced   Past Surgical History:  Procedure Laterality Date   ABDOMINAL AORTOGRAM W/LOWER EXTREMITY Bilateral 08/08/2020   Procedure: ABDOMINAL AORTOGRAM W/LOWER EXTREMITY;  Surgeon: DAngelia Mould MD;  Location:  [facility];  Service: Cardiovascular;  Laterality: Bilateral;   AMPUTATION Right 12/12/2020   Procedure: RIGHT BELOW KNEE AMPUTATION;  Surgeon: [name], MD;  Location: [place];  Service: Orthopedics;  Laterality: Right;   BACK SURGERY     "total of 3 times" S/P fall    CARPAL TUNNEL RELEASE Bilateral    CHOLECYSTECTOMY  1990's   COLONOSCOPY     CORONARY ANGIOPLASTY  11/11/11   CORONARY ANGIOPLASTY WITH STENT PLACEMENT  09/30/2011   "1 then; makes a total of 4"   CORONARY ANGIOPLASTY WITH STENT PLACEMENT  11/11/11   "1; makes a total of 5"   INGUINAL HERNIA REPAIR  2003   right   JOINT REPLACEMENT Right 04/03/2002   hip replacment   KNEE ARTHROSCOPY  1990's   left   LEFT HEART CATHETERIZATION WITH CORONARY ANGIOGRAM N/A 09/30/2011   Procedure: LEFT HEART CATHETERIZATION WITH CORONARY ANGIOGRAM;  Surgeon: Jettie Booze, MD;  Location: [facility] CATH LAB;  Service: Cardiovascular;  Laterality: N/A;  possible PCI   LEFT HEART CATHETERIZATION WITH CORONARY ANGIOGRAM N/A 11/15/2011   Procedure: LEFT HEART CATHETERIZATION WITH CORONARY ANGIOGRAM;  Surgeon: Jettie Booze, MD;  [facility] C[facility] LAB;  Service: Cardiovascular;  Laterality: N/A;   PERCUTANEOUS CORONARY STENT INTERVENTION (PCI-S)  09/30/2011   Procedure: PERCUTANEOUS CORONARY STENT INTERVENTION (PCI-S);  Surgeon: Jettie Booze, MD;  Location: [facility] CATH LAB;  Service: Cardiovascular;;   PERCUTANEOUS CORONARY STENT INTERVENTION (PCI-S) N/A 11/11/2011   Procedure: PERCUTANEOUS CORONARY STENT INTERVENTION (PCI-S);  Surgeon: Jettie Booze, MD;  Location: [facility] CATH LAB;  Service: Cardiovascular;   Laterality: N/A;   PERIPHERAL VASCULAR BALLOON ANGIOPLASTY Right 08/08/2020   Procedure: PERIPHERAL VASCULAR BALLOON ANGIOPLASTY;  Surgeon: Angelia Mould, MD;  Location: [place] CV LAB;  Service: Cardiovascular;  Laterality: Right;  Posterior tibial    SHOULDER SURGERY Right    X 2   STUMP REVISION Right 01/09/2021   Procedure: REVISION RIGHT BELOW KNEE AMPUTATION;  Surgeon: [name], MD;  Location: [place];  Service: Orthopedics;  Laterality: Right;   TONSILLECTOMY  ~ [place] Right 04/13/2019   Procedure: RIGHT TOTAL HIP REVISION-POSTERIOR  APPROACH LATERAL;  Surgeon: Marybelle Killings, MD;  Location: [place];  Service: Orthopedics;  Laterality: Right;   TOTAL KNEE ARTHROPLASTY Left 07/23/2016   Procedure: LEFT TOTAL KNEE ARTHROPLASTY;  Surgeon: Marybelle Killings, MD;  Location: [place];  Service: Orthopedics;  Laterality: Left;    FAMILY HISTORY Family History  Problem Relation Age of Onset   Diabetes Mother    Hyperlipidemia Mother    Hypertension Mother    Cancer Father    Hypertension Father    Diabetes Sister    Hypertension Sister    Cancer Brother    Heart attack Neg Hx     SOCIAL HISTORY Social History   Tobacco Use   Smoking status: Former   Smokeless tobacco: Current    Types: Chew   Tobacco comments:    quit 60 years ago  Vaping Use   Vaping Use: Never used  Substance Use Topics   Alcohol use: No   Drug use: No         OPHTHALMIC EXAM:  Base Eye Exam     Visual Acuity (ETDRS)       Right Left   Dist cc 20/25 20/40 -2   Dist ph cc  NI    Correction: Glasses  Tonometry (Tonopen, 10:52 AM)       Right Left   Pressure 10 10         Pupils       Pupils Dark Light Shape React APD   Right PERRL 3 2 Round Brisk None   Left PERRL 3 2 Round Brisk None         Visual Fields (Counting fingers)       Left Right     Full   Restrictions Partial outer superior temporal, superior nasal deficiencies           Extraocular Movement       Right Left    Full, Ortho Full, Ortho         Neuro/Psych     Oriented x3: Yes   Mood/Affect: Normal         Dilation     Both eyes: 1.0% Mydriacyl, 2.5% Phenylephrine @ 10:52 AM           Slit Lamp and Fundus Exam     External Exam       Right Left   External Normal Normal         Slit Lamp Exam       Right Left   Lids/Lashes Normal Normal   Conjunctiva/Sclera White and quiet White and quiet   Cornea Clear Clear   Anterior Chamber Deep and quiet Deep and quiet   Iris Round and reactive Round and reactive   Lens Centered posterior chamber intraocular lens Centered posterior chamber intraocular lens   Anterior Vitreous Normal Normal         Fundus Exam       Right Left   Posterior Vitreous Normal Normal   Disc Normal Normal   C/D Ratio 2.5 2.0   Macula Retinal pigment epithelial mottling, Intermediate age related macular degeneration Retinal pigment epithelial mottling, Intermediate age related macular degeneration   Vessels Normal Normal   Periphery Normal Normal            IMAGING AND PROCEDURES  Imaging and Procedures for 06/01/21  OCT, Retina - OU - Both Eyes       Right Eye Quality was borderline. Scan locations included subfoveal. Central Foveal Thickness: 288. Progression has worsened. Findings include abnormal foveal contour, subretinal scarring, inner retinal atrophy, intraretinal hyper-reflective material, intraretinal fluid.   Left Eye Quality was borderline. Scan locations included subfoveal. Central Foveal Thickness: 248. Progression has been stable. Findings include abnormal foveal contour, retinal drusen .   Notes OD with new onset intraretinal fluid inferior to the foveal avascular zone threatening acuity.  We will need to commence with therapy today OD     Intravitreal Injection, Pharmacologic Agent - OD - Right Eye       Time Out 06/01/2021. 11:23 AM. Confirmed correct patient, procedure,  site, and patient consented.   Anesthesia Topical anesthesia was used. Anesthetic medications included Akten 3.5%.   Procedure Preparation included Tobramycin 0.3%, 10% betadine to eyelids, 5% betadine to ocular surface. A 30 gauge needle was used.   Injection: 2.5 mg bevacizumab 2.5 MG/0.1ML   Route: Intravitreal, Site: Right Eye   NDC: [phone], Lot: [id number]   Post-op Post injection exam found visual acuity of at least counting fingers. The patient tolerated the procedure well. There were no complications. The patient received written and verbal post procedure care education. Post injection medications were not given.              ASSESSMENT/PLAN:  No problem-specific  Assessment & Plan notes found for this encounter.      ICD-10-CM   1. Exudative age-related macular degeneration of right eye with active choroidal neovascularization (HCC)  H35.3211 OCT, Retina - OU - Both Eyes    Intravitreal Injection, Pharmacologic Agent - OD - Right Eye    bevacizumab (AVASTIN) SOSY 2.5 mg    2. Intermediate stage nonexudative age-related macular degeneration of both eyes  H35.3132 OCT, Retina - OU - Both Eyes      1.  OD with new onset CNVM today.  Inferior to FAZ we will continue to monitor and observe.  We will commence with therapy intravitreal Avastin today.  2.  3.  Ophthalmic Meds Ordered this visit:  Meds ordered this encounter  Medications   bevacizumab (AVASTIN) SOSY 2.5 mg       Return in about 6 weeks (around 07/13/2021) for dilate, OD, AVASTIN OCT.  There are no Patient Instructions on file for this visit.   Explained the diagnoses, plan, and follow up with the patient and they expressed understanding.  Patient expressed understanding of the importance of proper follow up care.   Clent Demark Latica Hohmann M.D. Diseases & Surgery of the Retina and Vitreous Retina & Diabetic [place] 06/01/21     Abbreviations: M myopia (nearsighted); A astigmatism; H  hyperopia (farsighted); P presbyopia; Mrx spectacle prescription;  CTL contact lenses; OD right eye; OS left eye; OU both eyes  XT exotropia; ET esotropia; PEK punctate epithelial keratitis; PEE punctate epithelial erosions; DES dry eye syndrome; MGD meibomian gland dysfunction; ATs artificial tears; PFAT's preservative free artificial tears; [place] nuclear sclerotic cataract; PSC posterior subcapsular cataract; ERM epi-retinal membrane; PVD posterior vitreous detachment; RD retinal detachment; DM diabetes mellitus; DR diabetic retinopathy; NPDR non-proliferative diabetic retinopathy; PDR proliferative diabetic retinopathy; CSME clinically significant macular edema; DME diabetic macular edema; dbh dot blot hemorrhages; CWS cotton wool spot; POAG primary open angle glaucoma; C/D cup-to-disc ratio; HVF humphrey visual field; GVF goldmann visual field; OCT optical coherence tomography; IOP intraocular pressure; BRVO Branch retinal vein occlusion; CRVO central retinal vein occlusion; CRAO central retinal artery occlusion; BRAO branch retinal artery occlusion; RT retinal tear; SB scleral buckle; PPV pars plana vitrectomy; VH Vitreous hemorrhage; PRP panretinal laser photocoagulation; IVK intravitreal kenalog; VMT vitreomacular traction; MH Macular hole;  NVD neovascularization of the disc; NVE neovascularization elsewhere; AREDS age related eye disease study; ARMD age related macular degeneration; POAG primary open angle glaucoma; EBMD epithelial/anterior basement membrane dystrophy; ACIOL anterior chamber intraocular lens; IOL intraocular lens; PCIOL posterior chamber intraocular lens; Phaco/IOL phacoemulsification with intraocular lens placement; [place] photorefractive keratectomy; LASIK laser assisted in situ keratomileusis; HTN hypertension; DM diabetes mellitus; COPD chronic obstructive pulmonary disease

## 2021-06-01 NOTE — Assessment & Plan Note (Signed)
The nature of wet macular degeneration was discussed with the patient.  Forms of therapy reviewed include the use of Anti-VEGF medications injected painlessly into the eye, as well as other possible treatment modalities, including thermal laser therapy. Fellow eye involvement and risks were discussed with the patient. Upon the finding of wet age related macular degeneration, treatment will be offered. The treatment regimen is on a treat as needed basis with the intent to treat if necessary and extend interval of exams when possible. On average 1 out of 6 patients do not need lifetime therapy. However, the risk of recurrent disease is high for a lifetime.  Initially monthly, then periodic, examinations and evaluations will determine whether the next treatment is required on the day of the examination. New onset vision loss coincident with new onset intraretinal fluid and CNVM inferior to FAZ OD.  We will need to commence with antivegF therapy today follow-up next in 6 weeks OD likely retreat

## 2021-06-05 ENCOUNTER — Other Ambulatory Visit: Payer: Self-pay

## 2021-06-05 ENCOUNTER — Encounter: Payer: Self-pay | Admitting: Physical Therapy

## 2021-06-05 ENCOUNTER — Ambulatory Visit (INDEPENDENT_AMBULATORY_CARE_PROVIDER_SITE_OTHER): Payer: Medicare Other | Admitting: Physical Therapy

## 2021-06-05 DIAGNOSIS — R2689 Other abnormalities of gait and mobility: Secondary | ICD-10-CM

## 2021-06-05 DIAGNOSIS — R293 Abnormal posture: Secondary | ICD-10-CM

## 2021-06-05 DIAGNOSIS — R2681 Unsteadiness on feet: Secondary | ICD-10-CM | POA: Diagnosis not present

## 2021-06-05 DIAGNOSIS — M6281 Muscle weakness (generalized): Secondary | ICD-10-CM | POA: Diagnosis not present

## 2021-06-05 DIAGNOSIS — M25661 Stiffness of right knee, not elsewhere classified: Secondary | ICD-10-CM | POA: Diagnosis not present

## 2021-06-05 NOTE — Therapy (Signed)
[facility] Physical Therapy [address] [place], [place], [address] Phone: [phone]   Fax:  [phone]  Physical Therapy Treatment  Patient Details  Name: Keith Espinoza MRN: [id number] Date of Birth: 06/07/1941 Referring Provider (PT): Meridee Score, MD   Encounter Date: 06/05/2021   PT End of Session - 06/05/21 0943     Visit Number 8    Number of Visits 25    Date for PT Re-Evaluation 07/30/21    Authorization Type Medicare A[employer] and [employer]    Progress Note Due on Visit 10    PT Start Time 0845    PT Stop Time 0923    PT Time Calculation (min) 38 min    Equipment Utilized During Treatment Gait belt    Activity Tolerance Patient tolerated treatment well    Behavior During Therapy [facility] for tasks assessed/performed             Past Medical History:  Diagnosis Date   Allergic rhinitis    Allergic rhinitis    Arthritis    Basal cell carcinoma 11/01/2019    bcc left chest treatment TX cx3 [id number]    Chronic leg pain    right   Chronic lower back pain    Coronary artery disease    a. Stenting to RCA 2004; staged DES to LAD and Cx 2004. DES to mRCA 2012. b. DES to mCx, PTCA to dCx 11/2011. c. Lateral wall MI 2013 s/p PTCA to distal Cx & DES to mid OM2 11/2011. d. Low risk nuc 04/2014, EF wnl.   COVID-19    Diabetes mellitus    Insulin dependent   Diabetic neuropathy ([place]    MILD   Diverticulosis    Dysrhythmia    GRosanna Randysyndrome    Gout    right wrist; right foot; right elbow; have had it since 1970's   H/O hiatal hernia    Heart murmur    History of echocardiogram    aortic sclerosis per echo 12/09 EF 65%, otherwise normal   History of hemorrhoids    BLEEDING   History of kidney stones    h/o   Hypertension    Diagnosed 1995    Myocardial infarction ([facility]    Pancreatic pseudocyst    a. s/p remote drainage 2006.   [place]rombocytopenia ([place]    Seen on oldest labs in system from 2004   Vitamin B 12 deficiency    orally replaced     Past Surgical History:  Procedure Laterality Date   ABDOMINAL AORTOGRAM W/LOWER EXTREMITY Bilateral 08/08/2020   Procedure: ABDOMINAL AORTOGRAM W/LOWER EXTREMITY;  Surgeon: DAngelia Mould MD;  [facility];  Service: Cardiovascular;  Laterality: Bilateral;   AMPUTATION Right 12/12/2020   Procedure: RIGHT BELOW KNEE AMPUTATION;  Surgeon: DNewt Minion MD;  [place]  Service: Orthopedics;  Laterality: Right;   BACK SURGERY     "total of 3 times" S/P fall    CARPAL TUNNEL RELEASE Bilateral    CHOLECYSTECTOMY  1990's   COLONOSCOPY     CORONARY ANGIOPLASTY  11/11/11   CORONARY ANGIOPLASTY WITH STENT PLACEMENT  09/30/2011   "1 then; makes a total of 4"   CORONARY ANGIOPLASTY WITH STENT PLACEMENT  11/11/11   "1; makes a total of 5"   INGUINAL HERNIA REPAIR  2003   right   JOINT REPLACEMENT Right 04/03/2002   hip replacment   KNEE ARTHROSCOPY  1990's   left   LEFT HEART CATHETERIZATION  WITH CORONARY ANGIOGRAM N/A 09/30/2011   Procedure: LEFT HEART CATHETERIZATION WITH CORONARY ANGIOGRAM;  Surgeon: Jettie Booze, MD;  Location: [facility] CATH LAB;  Service: Cardiovascular;  Laterality: N/A;  possible PCI   LEFT HEART CATHETERIZATION WITH CORONARY ANGIOGRAM N/A 11/15/2011   Procedure: LEFT HEART CATHETERIZATION WITH CORONARY ANGIOGRAM;  Surgeon: Jettie Booze, MD;  Location: [facility] CATH LAB;  Service: Cardiovascular;  Laterality: N/A;   PERCUTANEOUS CORONARY STENT INTERVENTION (PCI-S)  09/30/2011   Procedure: PERCUTANEOUS CORONARY STENT INTERVENTION (PCI-S);  Surgeon: Jettie Booze, MD;  Location: [facility] CATH LAB;  Service: Cardiovascular;;   PERCUTANEOUS CORONARY STENT INTERVENTION (PCI-S) N/A 11/11/2011   Procedure: PERCUTANEOUS CORONARY STENT INTERVENTION (PCI-S);  Surgeon: Jettie Booze, MD;  Location: [facility] CATH LAB;  Service: Cardiovascular;  Laterality: N/A;   PERIPHERAL VASCULAR BALLOON ANGIOPLASTY Right 08/08/2020   Procedure: PERIPHERAL VASCULAR  BALLOON ANGIOPLASTY;  Surgeon: Angelia Mould, MD;  Location: [place] [facility];  Service: Cardiovascular;  Laterality: Right;  Posterior tibial    SHOULDER SURGERY Right    X 2   STUMP REVISION Right 01/09/2021   Procedure: REVISION RIGHT BELOW KNEE AMPUTATION;  Surgeon: [name], MD;  Location: [place];  Service: Orthopedics;  Laterality: Right;   TONSILLECTOMY  ~ [place] Right 04/13/2019   Procedure: RIGHT TOTAL HIP REVISION-POSTERIOR  APPROACH LATERAL;  Surgeon: Marybelle Killings, MD;  Location: [place];  Service: Orthopedics;  Laterality: Right;   TOTAL KNEE ARTHROPLASTY Left 07/23/2016   Procedure: LEFT TOTAL KNEE ARTHROPLASTY;  Surgeon: Marybelle Killings, MD;  Location: [place];  Service: Orthopedics;  Laterality: Left;    There were no vitals filed for this visit.   Subjective Assessment - 06/05/21 0934     Subjective He relays continued pain around his wound that feels worse when he has his pin liner on. It hurts to stand on it.    Patient is accompained by: Family member   granddaughter   Pertinent History Rt TTA, arthritis, right hip replacement, left knee arthroplasty, shoulder sg X2, gout, basal cell CA, LBP, back sg X3, IDDM, neuropathy, diverticulosis, heart murmur, HTN, MI,    Limitations Standing;House hold activities;Walking    Patient Stated Goals walk in community & on farm, get on tractor, yard work, drive               [employer] Adult PT Treatment/Exercise - 06/05/21 0001       Transfers   Transfers Sit to Stand;Stand to Sit    Sit to Stand 5: Supervision;With upper extremity assist;With armrests;From chair/3-in-1;Other (comment)   requires RW to stabilize   Stand to Sit 5: Supervision;With upper extremity assist;With armrests;To chair/3-in-1;Other (comment)   requires RW for stability     Ambulation/Gait   Ambulation/Gait Yes    Ambulation/Gait Assistance 5: Supervision    Ambulation Distance (Feet) 150 Feet    Assistive device Rolling  walker;Prosthesis    Ramp 4: Min assist      Neuro Re-ed    Neuro Re-ed Details  balance with fwd reaches, able to get to 7.5 inches on best attempt      Knee/Hip Exercises: Stretches   Active Hamstring Stretch Right;3 reps;30 seconds      Knee/Hip Exercises: Machines for Strengthening   Total Gym Leg Press DL 56# 3X10      Knee/Hip Exercises: Standing   Other Standing Knee Exercises hip abduction and hip extension X 20 ea on Rt with bilat UE support  PT Short Term Goals - 06/05/21 0907       PT SHORT TERM GOAL #1   Title Patient donnes prosthesis modified independent & verbalizes proper cleaning    Baseline met 9/2    Time 4    Period Weeks    Status Achieved    Target Date 06/04/21      PT SHORT TERM GOAL #2   Title Patient tolerates prosthesis >8 hrs total /day without increase in skin issues    Baseline limited to 2 hours due to pain    Time 4    Period Weeks    Status On-going    Target Date 06/04/21      PT SHORT TERM GOAL #3   Title Patient able to reach 7" and look over both shoulders without UE support with supervision.    Baseline Met 9/2    Time 4    Period Weeks    Status Achieved    Target Date 06/04/21      PT SHORT TERM GOAL #4   Title Patient ambulates 200' with RW & prosthesis with supervision & no loss of balance.    Baseline limited to 150 ft due to pain    Time 4    Period Weeks    Status On-going    Target Date 06/04/21      PT SHORT TERM GOAL #5   Title Patient negotiates ramps & curbs with RW & prosthesis with supervision.    Baseline able to perform ramp today but declined curb due to pain    Time 4    Period Weeks    Status On-going    Target Date 06/04/21               PT Long Term Goals - 05/18/21 1023       PT LONG TERM GOAL #1   Title Patient demonstrates & verbalized understanding of prosthetic care to enable safe utilization of prosthesis.    Time 12    Period Weeks    Status  On-going    Target Date 07/30/21      PT LONG TERM GOAL #2   Title Patient tolerates prosthesis wear >90% of awake hours without skin or limb pain issues.    Time 12    Period Weeks    Status On-going    Target Date 07/30/21      PT LONG TERM GOAL #3   Title [place] Balance >/= 36/56 to indicate lower fall risk    Time 12    Period Weeks    Status On-going    Target Date 07/30/21      PT LONG TERM GOAL #4   Title Patient ambulates 300' with LRAD & prosthesis modified independent    Time 12    Period Weeks    Status On-going    Target Date 07/30/21      PT LONG TERM GOAL #5   Title Patient negotiates ramps, curbs & stairs with LRAD & prosthesis modified independent.    Time 12    Period Weeks    Status On-going    Target Date 07/30/21                   Plan - 06/05/21 0909     Clinical Impression Statement He has not met STG #1 and #3, he has made good overalll functional progress toward the other goals and would likely be able to achieve these if he was not limited  by pain on his anterior proximal tibia which limited his session. His pain is due to slow healing wound but he does relay compliance with wearing vivewear shrinker.    Personal Factors and Comorbidities Comorbidity 3+;Age;Time since onset of injury/illness/exacerbation    Comorbidities Rt TTA, arthritis, right hip replacement, left knee arthroplasty, shoulder sg X2, gout, basal cell CA, LBP, back sg X3, IDDM, neuropathy, diverticulosis, heart murmur, HTN, MI,    Examination-Activity Limitations Lift;Locomotion Level;Squat;Stairs;Stand;Transfers    Examination-Participation Restrictions Community Activity;Yard Work    [employer] Evolving/Moderate complexity    Rehab Potential Good    PT Frequency 2x / week    PT Duration 12 weeks    PT Treatment/Interventions ADLs/Self Care Home Management;DME Instruction;Gait training;Stair training;Functional mobility training;Therapeutic  activities;Therapeutic exercise;Balance training;Neuromuscular re-education;Patient/family education;Prosthetic Training;Vestibular;Passive range of motion    PT Next Visit Plan standing balance,  leg press, progress prosthetic gait with RW including ramps & curbs,  check wounds    Consulted and Agree with Plan of Care Patient             Patient will benefit from skilled therapeutic intervention in order to improve the following deficits and impairments:  Abnormal gait, Decreased activity tolerance, Decreased balance, Decreased endurance, Decreased knowledge of use of DME, Decreased mobility, Decreased range of motion, Decreased skin integrity, Decreased scar mobility, Decreased strength, Increased edema, Impaired flexibility, Postural dysfunction, Prosthetic Dependency  Visit Diagnosis: Other abnormalities of gait and mobility  Unsteadiness on feet  Abnormal posture  Muscle weakness (generalized)  Stiffness of right knee, not elsewhere classified     Problem List Patient Active Problem List   Diagnosis Date Noted   Exudative age-related macular degeneration of right eye with active choroidal neovascularization ([place]) 06/01/2021   Intermediate stage nonexudative age-related macular degeneration of both eyes 06/01/2021   Type 2 diabetes mellitus with diabetic polyneuropathy, with long-term current use of insulin (HCC) 03/24/2021   Hypokalemia    Constipation 02/02/2021   Fecal impaction ([place]) 02/01/2021   Wound dehiscence 01/09/2021   Dehiscence of amputation stump (HCC)    Status post percutaneous transluminal coronary angioplasty 01/06/2021   Type II diabetes mellitus, uncontrolled ([place]) 01/06/2021   Acute pancreatitis 01/06/2021   Diabetic peripheral vascular disease ([place]) 01/06/2021   Encounter for screening for other disorder 01/06/2021   Enlarged prostate 01/06/2021   Foot ulcer, right ([place]) 01/06/2021   Gout 01/06/2021   Headache 01/06/2021   Neck pain 01/06/2021    Hypoglycemia 01/06/2021   Loss of appetite 01/06/2021   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   Peripheral neuropathy 01/06/2021   Sciatica 01/06/2021   Vitamin B12 deficiency 01/06/2021   Vitamin D deficiency 01/06/2021   Weakness 01/06/2021   Diabetes mellitus type 2 with neurological manifestations ([place]) 01/06/2021   Protein-calorie malnutrition, severe 12/26/2020   Acute blood loss anemia 12/26/2020   Prerenal azotemia 12/26/2020   Below-knee amputation of right lower extremity ([place]) 12/12/2020   Gangrene of right foot ([place])    Diabetic neuropathy ([place]) 11/17/2020   Hyperglycemia due to type 2 diabetes mellitus ([place]) 11/17/2020   Long term (current) use of insulin ([place]) 11/17/2020   Obesity 11/17/2020   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/2019   Hip dislocation, right ([place]) 04/13/2019   Other intervertebral disc degeneration, lumbar region 03/30/2019   CAD (coronary artery disease) 01/30/2019   Tobacco abuse 01/30/2019   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   Burn, foot, second degree, left, initial encounter 06/08/2017   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 03/16/2017   S/P total knee arthroplasty, left 10/26/2016   Hyperlipidemia 09/04/2014   Thrombocytopenia (HCC)    Precordial chest pain 04/05/2014   Coronary atherosclerosis of native coronary artery 10/01/2013   Other and unspecified hyperlipidemia 10/01/2013   Primary hypertension 10/01/2013   Diabetes mellitus ([place]) 10/01/2013   Esophageal reflux 10/01/2013   Hypertrophy of prostate without urinary obstruction and other lower urinary tract symptoms (LUTS) 10/01/2013    Silvestre Mesi 06/05/2021, 9:45 AM  [facility] Physical Therapy [address] [place], [place], [address] Phone: [phone]   Fax:  [phone]  Name: Keith Espinoza MRN: [id number] Date of Birth: 09-18-1941

## 2021-06-10 ENCOUNTER — Encounter: Payer: Medicare Other | Admitting: Physical Therapy

## 2021-06-11 ENCOUNTER — Encounter: Payer: Medicare Other | Admitting: Physical Therapy

## 2021-06-15 ENCOUNTER — Encounter: Payer: Medicare Other | Admitting: Physical Therapy

## 2021-06-16 ENCOUNTER — Encounter: Payer: Medicare Other | Admitting: Rehabilitation

## 2021-06-17 DIAGNOSIS — Z1152 Encounter for screening for COVID-19: Secondary | ICD-10-CM | POA: Diagnosis not present

## 2021-06-19 ENCOUNTER — Encounter: Payer: Medicare Other | Admitting: Physical Therapy

## 2021-06-23 DIAGNOSIS — E785 Hyperlipidemia, unspecified: Secondary | ICD-10-CM | POA: Diagnosis not present

## 2021-06-23 DIAGNOSIS — E1151 Type 2 diabetes mellitus with diabetic peripheral angiopathy without gangrene: Secondary | ICD-10-CM | POA: Diagnosis not present

## 2021-06-23 DIAGNOSIS — I10 Essential (primary) hypertension: Secondary | ICD-10-CM | POA: Diagnosis not present

## 2021-06-23 DIAGNOSIS — I25118 Atherosclerotic heart disease of native coronary artery with other forms of angina pectoris: Secondary | ICD-10-CM | POA: Diagnosis not present

## 2021-06-23 DIAGNOSIS — E1140 Type 2 diabetes mellitus with diabetic neuropathy, unspecified: Secondary | ICD-10-CM | POA: Diagnosis not present

## 2021-06-23 DIAGNOSIS — N401 Enlarged prostate with lower urinary tract symptoms: Secondary | ICD-10-CM | POA: Diagnosis not present

## 2021-06-23 DIAGNOSIS — K219 Gastro-esophageal reflux disease without esophagitis: Secondary | ICD-10-CM | POA: Diagnosis not present

## 2021-06-23 DIAGNOSIS — E1342 Other specified diabetes mellitus with diabetic polyneuropathy: Secondary | ICD-10-CM | POA: Diagnosis not present

## 2021-06-23 DIAGNOSIS — E1165 Type 2 diabetes mellitus with hyperglycemia: Secondary | ICD-10-CM | POA: Diagnosis not present

## 2021-06-23 DIAGNOSIS — N400 Enlarged prostate without lower urinary tract symptoms: Secondary | ICD-10-CM | POA: Diagnosis not present

## 2021-06-24 ENCOUNTER — Other Ambulatory Visit: Payer: Self-pay

## 2021-06-24 ENCOUNTER — Encounter: Payer: Self-pay | Admitting: Physical Therapy

## 2021-06-24 ENCOUNTER — Ambulatory Visit (INDEPENDENT_AMBULATORY_CARE_PROVIDER_SITE_OTHER): Payer: Medicare Other | Admitting: Physical Therapy

## 2021-06-24 DIAGNOSIS — R293 Abnormal posture: Secondary | ICD-10-CM | POA: Diagnosis not present

## 2021-06-24 DIAGNOSIS — R2689 Other abnormalities of gait and mobility: Secondary | ICD-10-CM

## 2021-06-24 DIAGNOSIS — M6281 Muscle weakness (generalized): Secondary | ICD-10-CM | POA: Diagnosis not present

## 2021-06-24 DIAGNOSIS — M25661 Stiffness of right knee, not elsewhere classified: Secondary | ICD-10-CM

## 2021-06-24 DIAGNOSIS — R2681 Unsteadiness on feet: Secondary | ICD-10-CM | POA: Diagnosis not present

## 2021-06-24 NOTE — Therapy (Signed)
[facility] [address] [place], [place], [address] Phone: [phone]   Fax:  [phone]  Physical Therapy Treatment  Patient Details  Name: Keith Espinoza MRN: [id number] Date of Birth: 26-Mar-1941 Referring Provider (PT): Meridee Score, MD   Encounter Date: 06/24/2021   PT End of Session - 06/24/21 1540     Visit Number 9    Number of Visits 25    Date for PT Re-Evaluation 07/30/21    Authorization Type Medicare A[employer] and [employer]    Progress Note Due on Visit 10    PT Start Time 1540    PT Stop Time 1626    PT Time Calculation (min) 46 min    Equipment Utilized During Treatment Gait belt    Activity Tolerance Patient tolerated treatment well    Behavior During Therapy [facility] for tasks assessed/performed             Past Medical History:  Diagnosis Date   Allergic rhinitis    Allergic rhinitis    Arthritis    Basal cell carcinoma 11/01/2019    bcc left chest treatment [place] cx3 27f    Chronic leg pain    right   Chronic lower back pain    Coronary artery disease    a. Stenting to RCA 2004; staged DES to LAD and Cx 2004. DES to mRCA 2012. b. DES to mCx, PTCA to dCx 11/2011. c. Lateral wall MI 2013 s/p PTCA to distal Cx & DES to mid OM2 11/2011. d. Low risk nuc 04/2014, EF wnl.   COVID-19    Diabetes mellitus    Insulin dependent   Diabetic neuropathy ([place]    MILD   Diverticulosis    Dysrhythmia    GRosanna Randysyndrome    Gout    right wrist; right foot; right elbow; have had it since 1970's   H/O hiatal hernia    Heart murmur    History of echocardiogram    aortic sclerosis per echo 12/09 EF 65%, otherwise normal   History of hemorrhoids    BLEEDING   History of kidney stones    h/o   Hypertension    Diagnosed 1995    Myocardial infarction ([facility]    Pancreatic pseudocyst    a. s/p remote drainage 2006.   Thrombocytopenia ([place]    Seen on oldest labs in system from 2004   Vitamin B 12 deficiency    orally replaced     Past Surgical History:  Procedure Laterality Date   ABDOMINAL AORTOGRAM W/LOWER EXTREMITY Bilateral 08/08/2020   Procedure: ABDOMINAL AORTOGRAM W/LOWER EXTREMITY;  Surgeon: DAngelia Mould MD;  [facility];  Service: Cardiovascular;  Laterality: Bilateral;   AMPUTATION Right 12/12/2020   Procedure: RIGHT BELOW KNEE AMPUTATION;  Surgeon: DNewt Minion MD;  [facility] [facility]  Service: Orthopedics;  Laterality: Right;   BACK SURGERY     "total of 3 times" S/P fall    CARPAL TUNNEL RELEASE Bilateral    CHOLECYSTECTOMY  1990's   COLONOSCOPY     CORONARY ANGIOPLASTY  11/11/11   CORONARY ANGIOPLASTY WITH STENT PLACEMENT  09/30/2011   "1 then; makes a total of 4"   CORONARY ANGIOPLASTY WITH STENT PLACEMENT  11/11/11   "1; makes a total of 5"   INGUINAL HERNIA REPAIR  2003   right   JOINT REPLACEMENT Right 04/03/2002   hip replacment   KNEE ARTHROSCOPY  1990's   left   LEFT HEART CATHETERIZATION  WITH CORONARY ANGIOGRAM N/A 09/30/2011   Procedure: LEFT HEART CATHETERIZATION WITH CORONARY ANGIOGRAM;  Surgeon: Jettie Booze, MD;  Location: [facility] CATH LAB;  Service: Cardiovascular;  Laterality: N/A;  possible PCI   LEFT HEART CATHETERIZATION WITH CORONARY ANGIOGRAM N/A 11/15/2011   Procedure: LEFT HEART CATHETERIZATION WITH CORONARY ANGIOGRAM;  Surgeon: Jettie Booze, MD;  Location: [facility] CATH LAB;  Service: Cardiovascular;  Laterality: N/A;   PERCUTANEOUS CORONARY STENT INTERVENTION (PCI-S)  09/30/2011   Procedure: PERCUTANEOUS CORONARY STENT INTERVENTION (PCI-S);  Surgeon: Jettie Booze, MD;  Location: [facility] CATH LAB;  Service: Cardiovascular;;   PERCUTANEOUS CORONARY STENT INTERVENTION (PCI-S) N/A 11/11/2011   Procedure: PERCUTANEOUS CORONARY STENT INTERVENTION (PCI-S);  Surgeon: Jettie Booze, MD;  Location: [facility] CATH LAB;  Service: Cardiovascular;  Laterality: N/A;   PERIPHERAL VASCULAR BALLOON ANGIOPLASTY Right 08/08/2020   Procedure: PERIPHERAL VASCULAR  BALLOON ANGIOPLASTY;  Surgeon: Angelia Mould, MD;  Location: [place] [facility];  Service: Cardiovascular;  Laterality: Right;  Posterior tibial    SHOULDER SURGERY Right    X 2   STUMP REVISION Right 01/09/2021   Procedure: REVISION RIGHT BELOW KNEE AMPUTATION;  Surgeon: [name], MD;  Location: [place];  Service: Orthopedics;  Laterality: Right;   TONSILLECTOMY  ~ [place] Right 04/13/2019   Procedure: RIGHT TOTAL HIP REVISION-POSTERIOR  APPROACH LATERAL;  Surgeon: Marybelle Killings, MD;  Location: [place];  Service: Orthopedics;  Laterality: Right;   TOTAL KNEE ARTHROPLASTY Left 07/23/2016   Procedure: LEFT TOTAL KNEE ARTHROPLASTY;  Surgeon: Marybelle Killings, MD;  Location: [place];  Service: Orthopedics;  Laterality: Left;    There were no vitals filed for this visit.   Subjective Assessment - 06/24/21 1540     Subjective He cut hole in one of liners over wound on tibial crest ~2 weeks ago.  He is not hurting as much now with weight bearing.    Patient is accompained by: Family member   granddaughter   Pertinent History Rt TTA, arthritis, right hip replacement, left knee arthroplasty, shoulder sg X2, gout, basal cell CA, LBP, back sg X3, IDDM, neuropathy, diverticulosis, heart murmur, HTN, MI,    Limitations Standing;House hold activities;Walking    Patient Stated Goals walk in community & on farm, get on tractor, yard work, drive    Currently in Pain? No/denies                               [facility] Adult PT Treatment/Exercise - 06/24/21 1540       Transfers   Transfers Sit to Stand;Stand to Sit    Sit to Stand 5: Supervision;With upper extremity assist;With armrests;From chair/3-in-1;Other (comment)   RW or cane touch to stabilize   Stand to Sit 5: Supervision;With upper extremity assist;With armrests;To chair/3-in-1;Other (comment)   cane or RW for stability     Ambulation/Gait   Ambulation/Gait Yes    Ambulation/Gait Assistance 5:  Supervision;4: Min assist;4: Min guard   supervision / minor verbal cues only w/ RW and minA to min guard with cane.   Ambulation/Gait Assistance Details demo & verbal cues on sequencing with cane, step through pattern & balance reactions.  Trialed cane with stand alone tip vs std tip.  No significant difference in stability, assist, stance duration or pain.  Pt prefers stand alone tip a little more than std tip.    Ambulation Distance (Feet) 250 Feet  250' RW and 100' X 3 cane   Assistive device Rolling walker;Straight cane;Prosthesis   cane stand alone tip & std tip   Ambulation Surface Level;Indoor    Ramp 5: Supervision   RW & TTA prosthesis   Curb 5: Supervision   RW & TTA prosthesis     High Level Balance   High Level Balance Activities Negotiating over obstacles    High Level Balance Comments PT demo & verbal cues on technique for stepping over obstacles with TTA prosthesis.  pt return demo 2 reps with cane stand alone tip with minA.      Therapeutic Activites    Other Therapeutic Activities Pt wants to climb on/off tractors which is similar technique with TTA prosthesis to climbing ladders.  PT demo & verbal cues on climbing A-frame ladder with TTA prosthesis.  Pt return demo understanding with contact guard assist.      Prosthetics   Prosthetic Care Comments  Pt reports his residual limb was fatigued yesterday with 7hrs straight wear.  PT advised to wear 4hrs 2x/day with 2-3 hr break bw wears and progress wear to 5hrs 2x/day then 6hrs 2x/day.  Pt cut hole in one of two liners over wound and little to no pain on wound.  PT advised to continue Vivewear shrinker under liner until wound heals.    Current prosthetic wear tolerance (days/week)  daily    Current prosthetic wear tolerance (#hours/day)  7hrs 1x/day or 4hrs 2x/day    Current prosthetic weight-bearing tolerance (hours/day)  Patient tolerated 5 minutes of standing & gait activities with partial weight on prosthesis with no wound /   limb discomfort.    Edema pitting edema    Residual limb condition  both scabs have come off wounds. Distal wound with complete granulation closure. Tibial crest wound has granualtion forming with no signs of infection. Size of wound is smaller.  Tibial crest appears to have some bone spurs forming in area.  color & temperature normal. minimal to no hair growth. cylinderical shape.    Education Provided Skin check;Residual limb care;Proper wear schedule/adjustment;Other (comment)   see prosthetic care comments.   Person(s) Educated Patient;Child(ren)    Education Method Explanation;Demonstration;Tactile cues;Verbal cues    Education Method Verbalized understanding;Tactile cues required;Verbal cues required;Needs further instruction    Donning Prosthesis Modified independent (device/increased time)                       PT Short Term Goals - 06/24/21 1642       PT SHORT TERM GOAL #1   Title Patient donnes prosthesis modified independent & verbalizes proper cleaning    Baseline met 9/2    Time 4    Period Weeks    Status Achieved    Target Date 06/04/21      PT SHORT TERM GOAL #2   Title Patient tolerates prosthesis >8 hrs total /day without increase in skin issues    Baseline limited to 2 hours due to pain    Time 4    Period Weeks    Status Achieved    Target Date 06/04/21      PT SHORT TERM GOAL #3   Title Patient able to reach 7" and look over both shoulders without UE support with supervision.    Baseline Met 9/2    Time 4    Period Weeks    Status Achieved    Target Date 06/04/21      PT SHORT TERM  GOAL #4   Title Patient ambulates 200' with RW & prosthesis with supervision & no loss of balance.    Baseline limited to 150 ft due to pain    Time 4    Period Weeks    Status Achieved    Target Date 06/04/21      PT SHORT TERM GOAL #5   Title Patient negotiates ramps & curbs with RW & prosthesis with supervision.    Baseline able to perform ramp today but  declined curb due to pain    Time 4    Period Weeks    Status Achieved    Target Date 06/04/21               PT Long Term Goals - 05/18/21 1023       PT LONG TERM GOAL #1   Title Patient demonstrates & verbalized understanding of prosthetic care to enable safe utilization of prosthesis.    Time 12    Period Weeks    Status On-going    Target Date 07/30/21      PT LONG TERM GOAL #2   Title Patient tolerates prosthesis wear >90% of awake hours without skin or limb pain issues.    Time 12    Period Weeks    Status On-going    Target Date 07/30/21      PT LONG TERM GOAL #3   Title [place] Balance >/= 36/56 to indicate lower fall risk    Time 12    Period Weeks    Status On-going    Target Date 07/30/21      PT LONG TERM GOAL #4   Title Patient ambulates 300' with LRAD & prosthesis modified independent    Time 12    Period Weeks    Status On-going    Target Date 07/30/21      PT LONG TERM GOAL #5   Title Patient negotiates ramps, curbs & stairs with LRAD & prosthesis modified independent.    Time 12    Period Weeks    Status On-going    Target Date 07/30/21                   Plan - 06/24/21 1540     Clinical Impression Statement PT progressed prosthetic gait to cane which did not increase limb pain.  He needed intermittent assist for balance but should improve with instruction & practice.  PT instructed in climbing ladder as similar to getting on/off tractor and he appears to have a general understanding.  Pt met all STGs.    Personal Factors and Comorbidities Comorbidity 3+;Age;Time since onset of injury/illness/exacerbation    Comorbidities Rt TTA, arthritis, right hip replacement, left knee arthroplasty, shoulder sg X2, gout, basal cell CA, LBP, back sg X3, IDDM, neuropathy, diverticulosis, heart murmur, HTN, MI,    Examination-Activity Limitations Lift;Locomotion Level;Squat;Stairs;Stand;Transfers    Examination-Participation Restrictions Community  Activity;Yard Work    [employer] officer Evolving/Moderate complexity    Rehab Potential Good    PT Frequency 2x / week    PT Duration 12 weeks    PT Treatment/Interventions ADLs/Self Care Home Management;DME Instruction;Gait training;Stair training;Functional mobility training;Therapeutic activities;Therapeutic exercise;Balance training;Neuromuscular re-education;Patient/family education;Prosthetic Training;Vestibular;Passive range of motion    PT Next Visit Plan standing balance,  leg press, progress prosthetic gait with cane including ramps & curbs,  check wounds, work towards [employer] and Agree with Plan of Care Patient  Patient will benefit from skilled therapeutic intervention in order to improve the following deficits and impairments:  Abnormal gait, Decreased activity tolerance, Decreased balance, Decreased endurance, Decreased knowledge of use of DME, Decreased mobility, Decreased range of motion, Decreased skin integrity, Decreased scar mobility, Decreased strength, Increased edema, Impaired flexibility, Postural dysfunction, Prosthetic Dependency  Visit Diagnosis: Other abnormalities of gait and mobility  Unsteadiness on feet  Abnormal posture  Muscle weakness (generalized)  Stiffness of right knee, not elsewhere classified     Problem List Patient Active Problem List   Diagnosis Date Noted   Exudative age-related macular degeneration of right eye with active choroidal neovascularization ([place]) 06/01/2021   Intermediate stage nonexudative age-related macular degeneration of both eyes 06/01/2021   Type 2 diabetes mellitus with diabetic polyneuropathy, with long-term current use of insulin ([place]) 03/24/2021   Hypokalemia    Constipation 02/02/2021   Fecal impaction ([place]) 02/01/2021   Wound dehiscence 01/09/2021   Dehiscence of amputation stump ([place])    Status post percutaneous transluminal coronary angioplasty 01/06/2021   Type II  diabetes mellitus, uncontrolled ([place]) 01/06/2021   Acute pancreatitis 01/06/2021   Diabetic peripheral vascular disease ([place]) 01/06/2021   Encounter for screening for other disorder 01/06/2021   Enlarged prostate 01/06/2021   Foot ulcer, right ([place]) 01/06/2021   Gout 01/06/2021   Headache 01/06/2021   Neck pain 01/06/2021   Hypoglycemia 01/06/2021   Loss of appetite 01/06/2021   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   Peripheral neuropathy 01/06/2021   Sciatica 01/06/2021   Vitamin B12 deficiency 01/06/2021   Vitamin D deficiency 01/06/2021   Weakness 01/06/2021   Diabetes mellitus type 2 with neurological manifestations ([place]) 01/06/2021   Protein-calorie malnutrition, severe 12/26/2020   Acute blood loss anemia 12/26/2020   Prerenal azotemia 12/26/2020   Below-knee amputation of right lower extremity ([place]) 12/12/2020   Gangrene of right foot ([place])    Diabetic neuropathy ([place]) 11/17/2020   Hyperglycemia due to type 2 diabetes mellitus ([place]) 11/17/2020   Long term (current) use of insulin ([place]) 11/17/2020   Obesity 11/17/2020   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/2019   Hip dislocation, right ([place]) 04/13/2019   Other intervertebral disc degeneration, lumbar region 03/30/2019   CAD (coronary artery disease) 01/30/2019   Tobacco abuse 01/30/2019   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   Burn, foot, second degree, left, initial encounter 06/08/2017   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 03/16/2017   S/P total knee arthroplasty, left 10/26/2016   Hyperlipidemia 09/04/2014   Thrombocytopenia ([facility])    Precordial chest pain 04/05/2014   Coronary atherosclerosis of native coronary artery 10/01/2013   Other and unspecified hyperlipidemia 10/01/2013   Primary hypertension 10/01/2013   Diabetes mellitus ([place]) 10/01/2013   Esophageal reflux 10/01/2013   Hypertrophy of prostate without urinary obstruction and other lower urinary tract symptoms (LUTS) 10/01/2013     Jamey Reas, PT, DPT 06/24/2021, 4:45 PM  [facility] Physical Therapy [address] [place], [place], [address] Phone: [phone]   Fax:  [phone]  Name: Keith Espinoza MRN: [id number] Date of Birth: 1941/03/17

## 2021-06-25 ENCOUNTER — Other Ambulatory Visit: Payer: Self-pay | Admitting: *Deleted

## 2021-06-25 ENCOUNTER — Ambulatory Visit (INDEPENDENT_AMBULATORY_CARE_PROVIDER_SITE_OTHER): Payer: Medicare Other | Admitting: Physical Therapy

## 2021-06-25 DIAGNOSIS — R2681 Unsteadiness on feet: Secondary | ICD-10-CM | POA: Diagnosis not present

## 2021-06-25 DIAGNOSIS — R2689 Other abnormalities of gait and mobility: Secondary | ICD-10-CM | POA: Diagnosis not present

## 2021-06-25 DIAGNOSIS — R293 Abnormal posture: Secondary | ICD-10-CM | POA: Diagnosis not present

## 2021-06-25 DIAGNOSIS — M25661 Stiffness of right knee, not elsewhere classified: Secondary | ICD-10-CM | POA: Diagnosis not present

## 2021-06-25 DIAGNOSIS — M6281 Muscle weakness (generalized): Secondary | ICD-10-CM | POA: Diagnosis not present

## 2021-06-25 NOTE — Patient Outreach (Signed)
[facility]) Care Management  06/25/2021  Keith Espinoza March 08, 1941 [id number]   Outgoing call placed to member, no answer, HIPAA compliant voice message left.  Will follow up within the next 3-4 business days.  Valente David, [place], MSN [place] [phone]

## 2021-06-26 NOTE — Therapy (Signed)
[facility] [address] [place], [place], [address] Phone: [phone]   Fax:  [phone]  Physical Therapy Treatment/10th visit progress note Progress Note reporting period 05/06/21 to 06/26/21  See below for objective and subjective measurements relating to patients progress with PT.   Patient Details  Name: Keith Espinoza MRN: [id number] Date of Birth: 1940-11-30 Referring Provider (PT): Meridee Score, MD   Encounter Date: 06/25/2021   PT End of Session - 06/26/21 0833     Visit Number 10    Number of Visits 25    Date for PT Re-Evaluation 07/30/21    Authorization Type Medicare A&[employer] and [employer]    Progress Note Due on Visit 20    PT Start Time 1602    PT Stop Time 1645    PT Time Calculation (min) 43 min    Equipment Utilized During Treatment Gait belt    Activity Tolerance Patient tolerated treatment well    Behavior During Therapy WFL for tasks assessed/performed             Past Medical History:  Diagnosis Date   Allergic rhinitis    Allergic rhinitis    Arthritis    Basal cell carcinoma 11/01/2019    bcc left chest treatment TX cx3 17f    Chronic leg pain    right   Chronic lower back pain    Coronary artery disease    a. Stenting to RCA 2004; staged DES to LAD and Cx 2004. DES to mRCA 2012. b. DES to mCx, PTCA to dCx 11/2011. c. Lateral wall MI 2013 s/p PTCA to distal Cx & DES to mid OM2 11/2011. d. Low risk nuc 04/2014, EF wnl.   COVID-19    Diabetes mellitus    Insulin dependent   Diabetic neuropathy ([place]    MILD   Diverticulosis    Dysrhythmia    GRosanna Randysyndrome    Gout    right wrist; right foot; right elbow; have had it since 1970's   H/O hiatal hernia    Heart murmur    History of echocardiogram    aortic sclerosis per echo 12/09 EF 65%, otherwise normal   History of hemorrhoids    BLEEDING   History of kidney stones    h/o   Hypertension    Diagnosed 1995    Myocardial infarction ([facility]     Pancreatic pseudocyst    a. s/p remote drainage 2006.   Thrombocytopenia ([place]    Seen on oldest labs in system from 2004   Vitamin B 12 deficiency    orally replaced    Past Surgical History:  Procedure Laterality Date   ABDOMINAL AORTOGRAM W/LOWER EXTREMITY Bilateral 08/08/2020   Procedure: ABDOMINAL AORTOGRAM W/LOWER EXTREMITY;  Surgeon: DAngelia Mould MD;  [facility];  Service: Cardiovascular;  Laterality: Bilateral;   AMPUTATION Right 12/12/2020   Procedure: RIGHT BELOW KNEE AMPUTATION;  Surgeon: DNewt Minion MD;  [place] [facility]  Service: Orthopedics;  Laterality: Right;   BACK SURGERY     "total of 3 times" S/P fall    CARPAL TUNNEL RELEASE Bilateral    CHOLECYSTECTOMY  1990's   COLONOSCOPY     CORONARY ANGIOPLASTY  11/11/11   CORONARY ANGIOPLASTY WITH STENT PLACEMENT  09/30/2011   "1 then; makes a total of 4"   CORONARY ANGIOPLASTY WITH STENT PLACEMENT  11/11/11   "1; makes a total of 5"   INGUINAL HERNIA REPAIR  2003  right   JOINT REPLACEMENT Right 04/03/2002   hip replacment   KNEE ARTHROSCOPY  1990's   left   LEFT HEART CATHETERIZATION WITH CORONARY ANGIOGRAM N/A 09/30/2011   Procedure: LEFT HEART CATHETERIZATION WITH CORONARY ANGIOGRAM;  Surgeon: Jettie Booze, MD;  Location: [facility] CATH LAB;  Service: Cardiovascular;  Laterality: N/A;  possible PCI   LEFT HEART CATHETERIZATION WITH CORONARY ANGIOGRAM N/A 11/15/2011   Procedure: LEFT HEART CATHETERIZATION WITH CORONARY ANGIOGRAM;  Surgeon: Jettie Booze, MD;  Location: [facility] CATH LAB;  Service: Cardiovascular;  Laterality: N/A;   PERCUTANEOUS CORONARY STENT INTERVENTION (PCI-S)  09/30/2011   Procedure: PERCUTANEOUS CORONARY STENT INTERVENTION (PCI-S);  Surgeon: Jettie Booze, MD;  Location: [facility] CATH LAB;  Service: Cardiovascular;;   PERCUTANEOUS CORONARY STENT INTERVENTION (PCI-S) N/A 11/11/2011   Procedure: PERCUTANEOUS CORONARY STENT INTERVENTION (PCI-S);  Surgeon: Jettie Booze, MD;  Location: [facility] CATH LAB;  Service: Cardiovascular;  Laterality: N/A;   PERIPHERAL VASCULAR BALLOON ANGIOPLASTY Right 08/08/2020   Procedure: PERIPHERAL VASCULAR BALLOON ANGIOPLASTY;  Surgeon: Angelia Mould, MD;  Location: [place] [facility];  Service: Cardiovascular;  Laterality: Right;  Posterior tibial    SHOULDER SURGERY Right    X 2   STUMP REVISION Right 01/09/2021   Procedure: REVISION RIGHT BELOW KNEE AMPUTATION;  Surgeon: [name], MD;  Location: [place];  Service: Orthopedics;  Laterality: Right;   TONSILLECTOMY  ~ [place] Right 04/13/2019   Procedure: RIGHT TOTAL HIP REVISION-POSTERIOR  APPROACH LATERAL;  Surgeon: Marybelle Killings, MD;  Location: [place];  Service: Orthopedics;  Laterality: Right;   TOTAL KNEE ARTHROPLASTY Left 07/23/2016   Procedure: LEFT TOTAL KNEE ARTHROPLASTY;  Surgeon: Marybelle Killings, MD;  Location: [place];  Service: Orthopedics;  Laterality: Left;    There were no vitals filed for this visit.   Subjective Assessment - 06/26/21 0800     Subjective He feels he is overall improving. He was able to use riding lawn mower to [employer] his yard for 2 hours, but he does report dizziness at beginning of session and thinks it may be due to all the turning from his zero turn mower.    Patient is accompained by: Family member   granddaughter   Pertinent History Rt TTA, arthritis, right hip replacement, left knee arthroplasty, shoulder sg X2, gout, basal cell CA, LBP, back sg X3, IDDM, neuropathy, diverticulosis, heart murmur, HTN, MI,    Limitations Standing;House hold activities;Walking    Patient Stated Goals walk in community & on farm, get on tractor, yard work, drive    Pain Onset More than a month ago                               [facility] Adult PT Treatment/Exercise - 06/26/21 0001       Transfers   Transfers Sit to Stand;Stand to Sit    Sit to Stand 5: Supervision;With upper extremity assist;With armrests;From  chair/3-in-1;Other (comment)   cane touch to stabilize   Stand to Sit 5: Supervision;With upper extremity assist;With armrests;To chair/3-in-1;Other (comment)   cane     Ambulation/Gait   Ambulation/Gait Yes    Ambulation/Gait Assistance 5: Supervision;4: Min guard;4: Min assist    Ambulation/Gait Assistance Details at times for turning for balance with SPC    Ambulation Distance (Feet) 150 Feet   then 100, 75   Assistive device Straight cane    Ramp 4:  Min assist    Ramp Details (indicate cue type and reason) with SPC and verbal cues and demo for technique.    Curb 4: Min assist    Curb Details (indicate cue type and reason) with SPC and verbal cues and demo for technique.      High Level Balance   High Level Balance Comments with SPC ambulating sideways and retro 3 round trips in bars only using UE support PRN      Knee/Hip Exercises: Aerobic   Nustep L6 X10 min UE/LE      Knee/Hip Exercises: Machines for Strengthening   Total Gym Leg Press DL 68# 3X10                       PT Short Term Goals - 06/26/21 0834       PT SHORT TERM GOAL #1   Title Patient donnes prosthesis modified independent & verbalizes proper cleaning    Baseline met 9/2    Time 4    Period Weeks    Status Achieved    Target Date 06/04/21      PT SHORT TERM GOAL #2   Title Patient tolerates prosthesis >8 hrs total /day without increase in skin issues    Baseline now has met after he modified his liner    Time 4    Period Weeks    Status Achieved    Target Date 06/04/21      PT SHORT TERM GOAL #3   Title Patient able to reach 7" and look over both shoulders without UE support with supervision.    Baseline Met 9/2    Time 4    Period Weeks    Status Achieved    Target Date 06/04/21      PT SHORT TERM GOAL #4   Title Patient ambulates 200' with RW & prosthesis with supervision & no loss of balance.    Baseline now has met    Time 4    Period Weeks    Status Achieved    Target  Date 06/04/21      PT SHORT TERM GOAL #5   Title Patient negotiates ramps & curbs with RW & prosthesis with supervision.    Baseline able to perform ramp today but declined curb due to pain    Time 4    Period Weeks    Status Achieved    Target Date 06/04/21               PT Long Term Goals - 05/18/21 1023       PT LONG TERM GOAL #1   Title Patient demonstrates & verbalized understanding of prosthetic care to enable safe utilization of prosthesis.    Time 12    Period Weeks    Status On-going    Target Date 07/30/21      PT LONG TERM GOAL #2   Title Patient tolerates prosthesis wear >90% of awake hours without skin or limb pain issues.    Time 12    Period Weeks    Status On-going    Target Date 07/30/21      PT LONG TERM GOAL #3   Title Berg Balance >/= 36/56 to indicate lower fall risk    Time 12    Period Weeks    Status On-going    Target Date 07/30/21      PT LONG TERM GOAL #4   Title Patient ambulates 300' with LRAD & prosthesis  modified independent    Time 12    Period Weeks    Status On-going    Target Date 07/30/21      PT LONG TERM GOAL #5   Title Patient negotiates ramps, curbs & stairs with LRAD & prosthesis modified independent.    Time 12    Period Weeks    Status On-going    Target Date 07/30/21                   Plan - 06/26/21 0835     Clinical Impression Statement PT progress note reflects he has made great overall progress and has met his STG. We are progressing him with prosthetic gait from RW to [facility]. He does still require min A with this at times for balance during turning. He was instructed not to use SPC at home yet until we improve more with PT. He did have some dizziness today, I checked his BP at beginning of session which was 112/56. We had him ride Nu step to increased BP which did improve to 138/70 after and his dizziness resolved. We still cant rule out vertigo so we can follow up and screen for this if his dizziness  continues. Recommending to continue PT plan of care.    Personal Factors and Comorbidities Comorbidity 3+;Age;Time since onset of injury/illness/exacerbation    Comorbidities Rt TTA, arthritis, right hip replacement, left knee arthroplasty, shoulder sg X2, gout, basal cell CA, LBP, back sg X3, IDDM, neuropathy, diverticulosis, heart murmur, HTN, MI,    Examination-Activity Limitations Lift;Locomotion Level;Squat;Stairs;Stand;Transfers    Examination-Participation Restrictions Community Activity;Yard Work    [employer]chant [employer] officer Evolving/Moderate complexity    Rehab Potential Good    PT Frequency 2x / week    PT Duration 12 weeks    PT Treatment/Interventions ADLs/Self Care Home Management;DME Instruction;Gait training;Stair training;Functional mobility training;Therapeutic activities;Therapeutic exercise;Balance training;Neuromuscular re-education;Patient/family education;Prosthetic Training;Vestibular;Passive range of motion    PT Next Visit Plan how is dizziness? standing balance,progress prosthetic gait with cane including ramps & curbs,  check wounds, work towards [employer] and Agree with Plan of Care Patient             Patient will benefit from skilled therapeutic intervention in order to improve the following deficits and impairments:  Abnormal gait, Decreased activity tolerance, Decreased balance, Decreased endurance, Decreased knowledge of use of DME, Decreased mobility, Decreased range of motion, Decreased skin integrity, Decreased scar mobility, Decreased strength, Increased edema, Impaired flexibility, Postural dysfunction, Prosthetic Dependency  Visit Diagnosis: Other abnormalities of gait and mobility  Unsteadiness on feet  Abnormal posture  Muscle weakness (generalized)  Stiffness of right knee, not elsewhere classified     Problem List Patient Active Problem List   Diagnosis Date Noted   Exudative age-related macular degeneration of right  eye with active choroidal neovascularization ([place]) 06/01/2021   Intermediate stage nonexudative age-related macular degeneration of both eyes 06/01/2021   Type 2 diabetes mellitus with diabetic polyneuropathy, with long-term current use of insulin (HCC) 03/24/2021   Hypokalemia    Constipation 02/02/2021   Fecal impaction ([place]) 02/01/2021   Wound dehiscence 01/09/2021   Dehiscence of amputation stump (HCC)    Status post percutaneous transluminal coronary angioplasty 01/06/2021   Type II diabetes mellitus, uncontrolled ([place]) 01/06/2021   Acute pancreatitis 01/06/2021   Diabetic peripheral vascular disease ([place]) 01/06/2021   Encounter for screening for other disorder 01/06/2021   Enlarged prostate 01/06/2021   Foot ulcer, right ([place]) 01/06/2021  Gout 01/06/2021   Headache 01/06/2021   Neck pain 01/06/2021   Hypoglycemia 01/06/2021   Loss of appetite 01/06/2021   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   Peripheral neuropathy 01/06/2021   Sciatica 01/06/2021   Vitamin B12 deficiency 01/06/2021   Vitamin D deficiency 01/06/2021   Weakness 01/06/2021   Diabetes mellitus type 2 with neurological manifestations ([place]) 01/06/2021   Protein-calorie malnutrition, severe 12/26/2020   Acute blood loss anemia 12/26/2020   Prerenal azotemia 12/26/2020   Below-knee amputation of right lower extremity ([place]) 12/12/2020   Gangrene of right foot ([place])    Diabetic neuropathy ([place]) 11/17/2020   Hyperglycemia due to type 2 diabetes mellitus ([place]) 11/17/2020   Long term (current) use of insulin ([place]) 11/17/2020   Obesity 11/17/2020   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/2019   Hip dislocation, right ([place]) 04/13/2019   Other intervertebral disc degeneration, lumbar region 03/30/2019   CAD (coronary artery disease) 01/30/2019   Tobacco abuse 01/30/2019   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   Burn, foot, second degree, left, initial encounter 06/08/2017   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 03/16/2017   S/P total knee arthroplasty, left 10/26/2016   Hyperlipidemia 09/04/2014   Thrombocytopenia (HCC)    Precordial chest pain 04/05/2014   Coronary atherosclerosis of native coronary artery 10/01/2013   Other and unspecified hyperlipidemia 10/01/2013   Primary hypertension 10/01/2013   Diabetes mellitus ([place]) 10/01/2013   Esophageal reflux 10/01/2013   Hypertrophy of prostate without urinary obstruction and other lower urinary tract symptoms (LUTS) 10/01/2013    Debbe Odea, PT,DPT 06/26/2021, 8:44 AM  [facility] [address] [place], [place], [address] Phone: [phone]   Fax:  [phone]  Name: Keith Espinoza MRN: [id number] Date of Birth: [birth year]

## 2021-06-29 ENCOUNTER — Encounter: Payer: Self-pay | Admitting: Physical Therapy

## 2021-06-29 ENCOUNTER — Telehealth (INDEPENDENT_AMBULATORY_CARE_PROVIDER_SITE_OTHER): Payer: Self-pay

## 2021-06-29 ENCOUNTER — Other Ambulatory Visit: Payer: Self-pay

## 2021-06-29 ENCOUNTER — Ambulatory Visit (INDEPENDENT_AMBULATORY_CARE_PROVIDER_SITE_OTHER): Payer: Medicare Other | Admitting: Physical Therapy

## 2021-06-29 DIAGNOSIS — M25661 Stiffness of right knee, not elsewhere classified: Secondary | ICD-10-CM | POA: Diagnosis not present

## 2021-06-29 DIAGNOSIS — R2681 Unsteadiness on feet: Secondary | ICD-10-CM | POA: Diagnosis not present

## 2021-06-29 DIAGNOSIS — R293 Abnormal posture: Secondary | ICD-10-CM

## 2021-06-29 DIAGNOSIS — R2689 Other abnormalities of gait and mobility: Secondary | ICD-10-CM | POA: Diagnosis not present

## 2021-06-29 DIAGNOSIS — M6281 Muscle weakness (generalized): Secondary | ICD-10-CM

## 2021-06-29 NOTE — Telephone Encounter (Signed)
Left voicemail to let pt know that d/t symptoms he needs to be seen earlier and we would like for him to come in on Thursday, July 02, 2021 at 1030.

## 2021-06-29 NOTE — Therapy (Signed)
[name] [address] [place], [place], [address] Phone: [phone]   Fax:  [phone]  Physical Therapy Treatment  Patient Details  Name: Keith Espinoza MRN: [id number] Date of Birth: 01-13-1941 Referring Provider (PT): Meridee Score, MD   Encounter Date: 06/29/2021   PT End of Session - 06/29/21 1651     Visit Number 11    Number of Visits 25    Date for PT Re-Evaluation 07/30/21    Authorization Type Medicare A&B and [employer]    Progress Note Due on Visit 20    PT Start Time 1605    PT Stop Time 1650    PT Time Calculation (min) 45 min    Equipment Utilized During Treatment Gait belt    Activity Tolerance Patient tolerated treatment well    Behavior During Therapy [facility] for tasks assessed/performed             Past Medical History:  Diagnosis Date   Allergic rhinitis    Allergic rhinitis    Arthritis    Basal cell carcinoma 11/01/2019    bcc left chest treatment TX cx3 [id number]    Chronic leg pain    right   Chronic lower back pain    Coronary artery disease    a. Stenting to RCA 2004; staged DES to LAD and Cx 2004. DES to mRCA 2012. b. DES to mCx, PTCA to dCx 11/2011. c. Lateral wall MI 2013 s/p PTCA to distal Cx & DES to mid OM2 11/2011. d. Low risk nuc 04/2014, EF wnl.   COVID-19    Diabetes mellitus    Insulin dependent   Diabetic neuropathy ([place])    MILD   Diverticulosis    Dysrhythmia    [name] syndrome    Gout    right wrist; right foot; right elbow; have had it since 1970's   H/O hiatal hernia    Heart murmur    History of echocardiogram    aortic sclerosis per echo 12/09 EF 65%, otherwise normal   History of hemorrhoids    BLEEDING   History of kidney stones    h/o   Hypertension    Diagnosed 1995    Myocardial infarction [facility])    Pancreatic pseudocyst    a. s/p remote drainage 2006.   Thrombocytopenia ([place])    Seen on oldest labs in system from 2004   Vitamin B 12 deficiency    orally replaced     Past Surgical History:  Procedure Laterality Date   ABDOMINAL AORTOGRAM W/LOWER EXTREMITY Bilateral 08/08/2020   Procedure: ABDOMINAL AORTOGRAM W/LOWER EXTREMITY;  Surgeon: Angelia Mould, MD;  Location: [place] CV LAB;  Service: Cardiovascular;  Laterality: Bilateral;   AMPUTATION Right 12/12/2020   Procedure: RIGHT BELOW KNEE AMPUTATION;  Surgeon: [name], MD;  Location: [place];  Service: Orthopedics;  Laterality: Right;   BACK SURGERY     "total of 3 times" S/P fall    CARPAL TUNNEL RELEASE Bilateral    CHOLECYSTECTOMY  1990's   COLONOSCOPY     CORONARY ANGIOPLASTY  11/11/11   CORONARY ANGIOPLASTY WITH STENT PLACEMENT  09/30/2011   "1 then; makes a total of 4"   CORONARY ANGIOPLASTY WITH STENT PLACEMENT  11/11/11   "1; makes a total of 5"   INGUINAL HERNIA REPAIR  2003   right   JOINT REPLACEMENT Right 04/03/2002   hip replacment   KNEE ARTHROSCOPY  1990's   left   LEFT HEART CATHETERIZATION  WITH CORONARY ANGIOGRAM N/A 09/30/2011   Procedure: LEFT HEART CATHETERIZATION WITH CORONARY ANGIOGRAM;  Surgeon: Jettie Booze, MD;  Location: [facility] CATH LAB;  Service: Cardiovascular;  Laterality: N/A;  possible PCI   LEFT HEART CATHETERIZATION WITH CORONARY ANGIOGRAM N/A 11/15/2011   Procedure: LEFT HEART CATHETERIZATION WITH CORONARY ANGIOGRAM;  Surgeon: Jettie Booze, MD;  Location: [facility] CATH LAB;  Service: Cardiovascular;  Laterality: N/A;   PERCUTANEOUS CORONARY STENT INTERVENTION (PCI-S)  09/30/2011   Procedure: PERCUTANEOUS CORONARY STENT INTERVENTION (PCI-S);  Surgeon: Jettie Booze, MD;  Location: [facility] CATH LAB;  Service: Cardiovascular;;   PERCUTANEOUS CORONARY STENT INTERVENTION (PCI-S) N/A 11/11/2011   Procedure: PERCUTANEOUS CORONARY STENT INTERVENTION (PCI-S);  Surgeon: Jettie Booze, MD;  Location: [facility] CATH LAB;  Service: Cardiovascular;  Laterality: N/A;   PERIPHERAL VASCULAR BALLOON ANGIOPLASTY Right 08/08/2020   Procedure: PERIPHERAL VASCULAR  BALLOON ANGIOPLASTY;  Surgeon: Angelia Mould, MD;  Location: [place] CV LAB;  Service: Cardiovascular;  Laterality: Right;  Posterior tibial    SHOULDER SURGERY Right    X 2   STUMP REVISION Right 01/09/2021   Procedure: REVISION RIGHT BELOW KNEE AMPUTATION;  Surgeon: [name], MD;  Location: [place];  Service: Orthopedics;  Laterality: Right;   TONSILLECTOMY  ~ [place] Right 04/13/2019   Procedure: RIGHT TOTAL HIP REVISION-POSTERIOR  APPROACH LATERAL;  Surgeon: Marybelle Killings, MD;  Location: [place];  Service: Orthopedics;  Laterality: Right;   TOTAL KNEE ARTHROPLASTY Left 07/23/2016   Procedure: LEFT TOTAL KNEE ARTHROPLASTY;  Surgeon: Marybelle Killings, MD;  Location: [place];  Service: Orthopedics;  Laterality: Left;    There were no vitals filed for this visit.   Subjective Assessment - 06/29/21 1605     Subjective He saw prosthetist prior to PT today.  He is wearing prosthesis in morning within 66min of arising, 2-3 hours midday and 2nd time for 2-4 hours.    Patient is accompained by: Family member   granddaughter   Pertinent History Rt TTA, arthritis, right hip replacement, left knee arthroplasty, shoulder sg X2, gout, basal cell CA, LBP, back sg X3, IDDM, neuropathy, diverticulosis, heart murmur, HTN, MI,    Limitations Standing;House hold activities;Walking    Patient Stated Goals walk in community & on farm, get on tractor, yard work, drive    Currently in Pain? No/denies    Pain Onset More than a month ago                               [facility] Adult PT Treatment/Exercise - 06/29/21 1605       Transfers   Transfers Sit to Stand;Stand to Sit    Sit to Stand 5: Supervision;With upper extremity assist;With armrests;From chair/3-in-1;Other (comment)   cane touch to stabilize   Stand to Sit 5: Supervision;With upper extremity assist;With armrests;To chair/3-in-1;Other (comment)   cane     Ambulation/Gait   Ambulation/Gait Yes     Ambulation/Gait Assistance 5: Supervision;4: Min guard   min guard cane & no device,  supervision RW   Ambulation Distance (Feet) 150 Feet   100' & 150' cane & 30' X 3 no device   Assistive device Straight cane;None;Prosthesis   cane stand alone tip.   Ramp 4: Min assist   cane stand alone tip & TTA prosthesis   Ramp Details (indicate cue type and reason) verbal cues on technique    Curb 4: Min  assist   cane stand alone tip & TTA prosthesis   Curb Details (indicate cue type and reason) verbal cues on technique      High Level Balance   High Level Balance Comments with SPC ambulating sideways and retro 15' ea. with min guard.      Knee/Hip Exercises: Aerobic   Nustep L6 X8 min UE/LE      Knee/Hip Exercises: Machines for Strengthening   Total Gym Leg Press BLEs 75# 15 reps 2 sets      Prosthetics   Prosthetic Care Comments  PT reviewed increasing wear to 6hrs 2x/day starting within 30 min of arising.    Current prosthetic wear tolerance (days/week)  daily    Current prosthetic wear tolerance (#hours/day)  4-5hrs 2x/day    Current prosthetic weight-bearing tolerance (hours/day)  Pt tolerating weight bearing with cane up to 100' and no device 30' without limb pain.    Residual limb condition  both scabs have come off wounds. Distal wound with complete granulation closure. Tibial crest wound has granualtion forming with no signs of infection. Size of wound is smaller.  Tibial crest appears to have some bone spurs forming in area.  color & temperature normal. minimal to no hair growth. cylinderical shape.    Education Provided Skin check;Residual limb care;Proper wear schedule/adjustment    Person(s) Educated Patient    Education Method Explanation;Verbal cues    Education Method Verbalized understanding;Verbal cues required;Needs further instruction    Donning Prosthesis Modified independent (device/increased time)                       PT Short Term Goals - 06/29/21 1652        PT SHORT TERM GOAL #1   Title Patient verbalizes adjusting ply socks for prosthetic fit.    Status New    Target Date 07/09/21      PT SHORT TERM GOAL #2   Title Patient tolerates prosthesis >12 hrs total /day without increase in skin issues    Status Revised    Target Date 07/09/21      PT SHORT TERM GOAL #3   Title Patient able to pick up item from floor without UE support with supervision.    Status Revised    Target Date 07/09/21      PT SHORT TERM GOAL #4   Title Patient ambulates 200' with cane & prosthesis with supervision & no loss of balance.    Status Revised    Target Date 07/09/21      PT SHORT TERM GOAL #5   Title Patient negotiates ramps & curbs with cane & prosthesis with min guard.    Status Revised    Target Date 07/09/21               PT Long Term Goals - 05/18/21 1023       PT LONG TERM GOAL #1   Title Patient demonstrates & verbalized understanding of prosthetic care to enable safe utilization of prosthesis.    Time 12    Period Weeks    Status On-going    Target Date 07/30/21      PT LONG TERM GOAL #2   Title Patient tolerates prosthesis wear >90% of awake hours without skin or limb pain issues.    Time 12    Period Weeks    Status On-going    Target Date 07/30/21      PT LONG TERM GOAL #3   Title  Berg Balance >/= 36/56 to indicate lower fall risk    Time 12    Period Weeks    Status On-going    Target Date 07/30/21      PT LONG TERM GOAL #4   Title Patient ambulates 300' with LRAD & prosthesis modified independent    Time 12    Period Weeks    Status On-going    Target Date 07/30/21      PT LONG TERM GOAL #5   Title Patient negotiates ramps, curbs & stairs with LRAD & prosthesis modified independent.    Time 12    Period Weeks    Status On-going    Target Date 07/30/21                   Plan - 06/29/21 1654     Clinical Impression Statement Patient's wound continues to heal slowly. He is tolerating increased  weight bearing thru prosthesis with less UE support & increased distance.    Personal Factors and Comorbidities Comorbidity 3+;Age;Time since onset of injury/illness/exacerbation    Comorbidities Rt TTA, arthritis, right hip replacement, left knee arthroplasty, shoulder sg X2, gout, basal cell CA, LBP, back sg X3, IDDM, neuropathy, diverticulosis, heart murmur, HTN, MI,    Examination-Activity Limitations Lift;Locomotion Level;Squat;Stairs;Stand;Transfers    Examination-Participation Restrictions Community Activity;Yard Work    [employer] Evolving/Moderate complexity    Rehab Potential Good    PT Frequency 2x / week    PT Duration 12 weeks    PT Treatment/Interventions ADLs/Self Care Home Management;DME Instruction;Gait training;Stair training;Functional mobility training;Therapeutic activities;Therapeutic exercise;Balance training;Neuromuscular re-education;Patient/family education;Prosthetic Training;Vestibular;Passive range of motion    PT Next Visit Plan check wound, standing balance,progress prosthetic gait with cane including ramps & curbs,  check wounds, work towards updated STGs    Consulted and Agree with Plan of Care Patient             Patient will benefit from skilled therapeutic intervention in order to improve the following deficits and impairments:  Abnormal gait, Decreased activity tolerance, Decreased balance, Decreased endurance, Decreased knowledge of use of DME, Decreased mobility, Decreased range of motion, Decreased skin integrity, Decreased scar mobility, Decreased strength, Increased edema, Impaired flexibility, Postural dysfunction, Prosthetic Dependency  Visit Diagnosis: Other abnormalities of gait and mobility  Unsteadiness on feet  Abnormal posture  Muscle weakness (generalized)  Stiffness of right knee, not elsewhere classified     Problem List Patient Active Problem List   Diagnosis Date Noted   Exudative age-related macular  degeneration of right eye with active choroidal neovascularization ([place]) 06/01/2021   Intermediate stage nonexudative age-related macular degeneration of both eyes 06/01/2021   Type 2 diabetes mellitus with diabetic polyneuropathy, with long-term current use of insulin (H[facility]) 03/24/2021   Hypokalemia    Constipation 02/02/2021   Fecal impaction ([place]) 02/01/2021   Wound dehiscence 01/09/2021   Dehiscence of amputation stump ([facility])    Status post percutaneous transluminal coronary angioplasty 01/06/2021   Type II diabetes mellitus, uncontrolled ([place]) 01/06/2021   Acute pancreatitis 01/06/2021   Diabetic peripheral vascular disease ([place]) 01/06/2021   Encounter for screening for other disorder 01/06/2021   Enlarged prostate 01/06/2021   Foot ulcer, right ([place]) 01/06/2021   Gout 01/06/2021   Headache 01/06/2021   Neck pain 01/06/2021   Hypoglycemia 01/06/2021   Loss of appetite 01/06/2021   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) 01/06/2021   Protein-calorie malnutrition, severe 12/26/2020   Acute blood loss anemia 12/26/2020   Prerenal azotemia 12/26/2020   Below-knee amputation of right lower extremity ([place]) 12/12/2020   Gangrene of right foot ([place])    Diabetic neuropathy ([place]) 11/17/2020   Hyperglycemia due to type 2 diabetes mellitus ([place]) 11/17/2020   Long term (current) use of insulin ([place]) 11/17/2020   Obesity 11/17/2020   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/2019   Hip dislocation, right ([place]) 04/13/2019   Other intervertebral disc degeneration, lumbar region 03/30/2019   CAD (coronary artery disease) 01/30/2019   Tobacco abuse 01/30/2019   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   Burn, foot, second degree, left, initial encounter 06/08/2017   Sagittal band  rupture at metacarpophalangeal joint 03/16/2017   S/P total knee arthroplasty, left 10/26/2016   Hyperlipidemia 09/04/2014   Thrombocytopenia (HCC)    Precordial chest pain 04/05/2014   Coronary atherosclerosis of native coronary artery 10/01/2013   Other and unspecified hyperlipidemia 10/01/2013   Primary hypertension 10/01/2013   Diabetes mellitus ([place]) 10/01/2013   Esophageal reflux 10/01/2013   Hypertrophy of prostate without urinary obstruction and other lower urinary tract symptoms (LUTS) 10/01/2013    Jamey Reas, PT, DPT 06/29/2021, 4:58 PM  [place] [facility] [address] [place], [place], [address] Phone: [phone]   Fax:  [phone]  Name: Keith Espinoza MRN: [id number] Date of Birth: Apr 02, 1941

## 2021-06-30 ENCOUNTER — Ambulatory Visit (INDEPENDENT_AMBULATORY_CARE_PROVIDER_SITE_OTHER): Payer: Medicare Other | Admitting: Physical Therapy

## 2021-06-30 ENCOUNTER — Encounter: Payer: Self-pay | Admitting: Physical Therapy

## 2021-06-30 ENCOUNTER — Ambulatory Visit (INDEPENDENT_AMBULATORY_CARE_PROVIDER_SITE_OTHER): Payer: Medicare Other | Admitting: Internal Medicine

## 2021-06-30 ENCOUNTER — Other Ambulatory Visit: Payer: Self-pay | Admitting: *Deleted

## 2021-06-30 VITALS — BP 118/72 | HR 74 | Ht 70.0 in | Wt 148.6 lb

## 2021-06-30 DIAGNOSIS — Z794 Long term (current) use of insulin: Secondary | ICD-10-CM

## 2021-06-30 DIAGNOSIS — D3502 Benign neoplasm of left adrenal gland: Secondary | ICD-10-CM

## 2021-06-30 DIAGNOSIS — M6281 Muscle weakness (generalized): Secondary | ICD-10-CM | POA: Diagnosis not present

## 2021-06-30 DIAGNOSIS — R2689 Other abnormalities of gait and mobility: Secondary | ICD-10-CM

## 2021-06-30 DIAGNOSIS — E1159 Type 2 diabetes mellitus with other circulatory complications: Secondary | ICD-10-CM

## 2021-06-30 DIAGNOSIS — R2681 Unsteadiness on feet: Secondary | ICD-10-CM

## 2021-06-30 DIAGNOSIS — R293 Abnormal posture: Secondary | ICD-10-CM | POA: Diagnosis not present

## 2021-06-30 DIAGNOSIS — M25661 Stiffness of right knee, not elsewhere classified: Secondary | ICD-10-CM | POA: Diagnosis not present

## 2021-06-30 LAB — POCT GLYCOSYLATED HEMOGLOBIN (HGB A1C): Hemoglobin A1C: 7.5 % — AB (ref 4.0–5.6)

## 2021-06-30 LAB — GLUCOSE, POCT (MANUAL RESULT ENTRY): POC Glucose: 170 mg/dl — AB (ref 70–99)

## 2021-06-30 MED ORDER — TRESIBA FLEXTOUCH 100 UNIT/ML ~~LOC~~ SOPN: 9.0000 [IU] | PEN_INJECTOR | Freq: Every day | SUBCUTANEOUS | 3 refills | Status: DC

## 2021-06-30 NOTE — Patient Outreach (Addendum)
[facility]) Care Management  06/30/2021  Keith Espinoza 02-Feb-1941 [id number]   Outreach attempt #2, unsuccessful.  HIPAA compliant voice message left on both listed home and mobile numbers, will send outreach letter and follow up within the next 3-4 business days.    Update:  Incoming call received back from member.  State he is doing well, Denies any urgent concerns, encouraged to contact this care manager with questions.  Agrees to follow up within the next month.   Goals Addressed             This Visit's Progress    COMPLETED: ( THN)Monitor and Manage My Blood Sugar       Timeframe:  Long-Range Goal Priority:  Medium Start Date:  [id number]                       Expected End Date:  91791505                    Follow Up Date [id number]   - check blood sugar at prescribed times - check blood sugar if I feel it is too high or too low - enter blood sugar readings and medication or insulin into daily log - take the blood sugar log to all doctor visits - take the blood sugar meter to all doctor visits    Why is this important?   Checking your blood sugar at home helps to keep it from getting very high or very low.  Writing the results in a diary or log helps the doctor know how to care for you.  Your blood sugar log should have the time, date and the results.  Also, write down the amount of insulin or other medicine that you take.  Other information, like what you ate, exercise done and how you were feeling, will also be helpful.     Notes:   4/19 - Encouraged to check blood sugars daily  5/10 - Reminded of importance of managing daily blood sugars are with wound healing.  Unable to report blood sugar reading for today.  5/25 - report blood sugars are still slightly elevated, today was 294.  Has appointment with endocrinology on 6/21  6/24 - Blood sugars are trending down although today's reading was 230.  Insulin was decreased but has since increased it  again to compensate the elevated blood sugars.  He will make provider aware  7/25 - Report blood sugars are better, range 150-170 over the last few weeks  8/19 - State blood sugars are better controlled.  Today it was 96  9/27 - Blood sugars remains stable, state range 135-150     COMPLETED: [facility]) Make and Keep All Appointments   On track    Timeframe:  Long-Range Goal Priority:  Medium Start Date:   [id number]                          Expected End Date:   [id number]                   Follow Up Date [id number]   Barriers: Knowledge  - call to cancel if needed - keep a calendar with prescription refill dates - keep a calendar with appointment dates    Why is this important?   Part of staying healthy is seeing the doctor for follow-up care.  If you forget your appointments, there are some things you can  do to stay on track.    Notes:  Need to schedule exam  4/19 - Recently discharged from readmission - Reviewed follow up scheduled with member, had visit with ortho on yesterday, repeat next week  5/10 - Follow up with PCP completed yesterday.  Cardiology, vascular, and ortho appointments all scheduled for 5/18  5/25 - Confirms all 5/18 appointments were attended, will follow up with ortho on 6/7  6/24 - Followed up with ortho on 6/7, next visit scheduled for 7/5.  Daughter will provide transportation  7/25 - Report appointment with ortho on 8/2 to receive prosthetic, will have PT evaluation on 8/3  8/19 - Has been compliant with all appointments with therapy sessions.  Has follow up with endocrinology on 9/27  9/27 - Appointment with endocrinology today, will have appointment with [facility] in December, considering changing services completely to [facility] system     COMPLETED: (THN)Perform Foot Care       Timeframe:  Long-Range Goal Priority:  Medium Start Date:  [id number]                           Expected End Date:     32202542                 Follow Up Date [id number]  Barriers: Health  Behaviors Knowledge    - check feet daily for cuts, sores or redness - keep feet up while sitting - trim toenails straight across - wash and dry feet carefully every day - wear comfortable, cotton socks - wear comfortable, well-fitting shoes    Why is this important?   Good foot care is very important when you have diabetes.  There are many things you can do to keep your feet healthy and catch a problem early.    Notes:  Patient has ulcers on toes. Great toe has lost its toenail and is now draining [id number] foot is healing some  4/19 - Patient had right BKA, admitted for revision, discussed stump care and follow up with MD  5/10 - Member report staples removed, now has a few stitches with minimal drainage.  Plan is to fit for prosthetic once wound is fully healed.  Remains active with home health for nursing and PT/OT  5/25 - Report wound on stump healing, no issues with remaining foot  6/24 - Report stump continues to heal, still have small area where he is washing with soap and water twice a day.  Hoping to be able to be fit for prosthetic within the next couple weeks  7/25 - State wound has healed, providers feel his is ready for prosthetic  8/19 - Report he now has his prosthesis, working with rehab for PT  9/27 - state stump remains well, continues with ongoing management by Ortho.  Report he is walking much better with prosthesis, using cane and even taking steps without cane.       (THN)THN Set My Target A1C   On track    Timeframe:  Long-Range Goal Priority:  High Start Date:  [id number]                           Expected End Date:    26948546                  Follow Up Date [id number]   Barriers: Knowledge  - set target A1C - less than 7  Why is this important?   Your target A1C is decided together by you and your doctor.  It is based on several things like your age and other health issues.    Notes: 7.0  4/19 - Patient aware of proper diet to manage A1C as  well as adherence to medication regime  5/10 - Education sent on managing diabetes and decreasing A1C  5/25 - Understand importance of managing diabetes in relation to wound care  6/24 - Report seen by endocrinology on 6/21, will revisit on 9/27.  Medication changes made, will continue to monitor blood sugars  7/25 - A1C currently 7.5, report understanding of DM management in effort to decrease to goal  8/19 - Continuing to monitor and manage blood sugars to decrease A1C to goal  9/27 - Will have A1C rechecked today during office visit with endocrinology        Valente David, RN, MSN [place] Manager [phone]

## 2021-06-30 NOTE — Progress Notes (Signed)
Name: Keith Espinoza  Age/ Sex: 80 y.o., male   MRN/ DOB: [id number], 1941-09-24     PCP: [name], MD   Reason for Endocrinology Evaluation: Type 2 Diabetes Mellitus  Initial Endocrine Consultative Visit: 03/24/2021    PATIENT IDENTIFIER: [name] is a 80 y.o. male with a past medical history of Right BK amputations due  PVD. The patient has followed with Endocrinology clinic since 03/24/2021 for consultative assistance with management of his diabetes.  DIABETIC HISTORY:  Mr. Sasso was diagnosed with DM 3875, Trulicity caused weight loss, Has been on Glimepiride in the past. His hemoglobin A1c has ranged from 6.6% in 2020, peaking at 7.7% in 2022.     Transitioned care from Dr. Nehemiah Settle in 2022    On his initial visit to our clinic he had an A1c 7.5 % He was on jardiance, Metformin and Basal insulin, which I have reduced basal insulin    ADRENAL HISTORY : Pt found to have a 1 cm left adrenal nodule on CT imaging of abdomen on 01/31/2021. Of note, the pt was noted to have hypokalemia , this is chronic and has been on KCL for ~ 5 yrs      Aldo, renin, 24-hr urinary cortisol and catecholamine have all come back normal 03/2021   SUBJECTIVE:   During the last visit (03/24/2021): A1c 7.5% Decrease Levemir to 8 units daily , continued metformin and Jardiance       Today (06/30/2021): Mr. Chizmar is here for a follow up on diabetes management.  He checks his blood sugars 1 times daily. The patient has not had hypoglycemic episodes since the last clinic visit.   Denies nausea, vomiting or diarrhea    HOME DIABETES REGIMEN:  Levemir  8 units daily  Metformin 1000 mg Twice a day  Jardiance 25 mg daily     Statin: Intolerant to Simvastatin due to severe myalgia  ACE-I/ARB: yes      METER DOWNLOAD SUMMARY: Did not bring     DIABETIC COMPLICATIONS: Microvascular complications:  Neuropathy, ?DR Denies: CKD Last Eye Exam: Completed  03/2021  Macrovascular complications:  CAD, PVD Denies: CVA   HISTORY:  Past Medical History:  Past Medical History:  Diagnosis Date   Allergic rhinitis    Allergic rhinitis    Arthritis    Basal cell carcinoma 11/01/2019    bcc left chest treatment TX cx3 18fu    Chronic leg pain    right   Chronic lower back pain    Coronary artery disease    a. Stenting to RCA 2004; staged DES to LAD and Cx 2004. DES to mRCA 2012. b. DES to mCx, PTCA to dCx 11/2011. c. Lateral wall MI 2013 s/p PTCA to distal Cx & DES to mid OM2 11/2011. d. Low risk nuc 04/2014, EF wnl.   COVID-19    Diabetes mellitus    Insulin dependent   Diabetic neuropathy (HCC)    MILD   Diverticulosis    Dysrhythmia    [name] syndrome    Gout    right wrist; right foot; right elbow; have had it since 1970's   H/O hiatal hernia    Heart murmur    History of echocardiogram    aortic sclerosis per echo 12/09 EF 65%, otherwise normal   History of hemorrhoids    BLEEDING   History of kidney stones    h/o   Hypertension    Diagnosed 1995    Myocardial infarction ([place])  Pancreatic pseudocyst    a. s/p remote drainage 2006.   Thrombocytopenia ([place])    Seen on oldest labs in system from 2004   Vitamin B 12 deficiency    orally replaced   Past Surgical History:  Past Surgical History:  Procedure Laterality Date   ABDOMINAL AORTOGRAM W/LOWER EXTREMITY Bilateral 08/08/2020   Procedure: ABDOMINAL AORTOGRAM W/LOWER EXTREMITY;  Surgeon: Angelia Mould, MD;  Location: [facility];  Service: Cardiovascular;  Laterality: Bilateral;   AMPUTATION Right 12/12/2020   Procedure: RIGHT BELOW KNEE AMPUTATION;  Surgeon: [name], MD;  Location: [facility];  Service: Orthopedics;  Laterality: Right;   BACK SURGERY     "total of 3 times" S/P fall    CARPAL TUNNEL RELEASE Bilateral    CHOLECYSTECTOMY  1990's   COLONOSCOPY     CORONARY ANGIOPLASTY  11/11/11   CORONARY ANGIOPLASTY WITH STENT PLACEMENT  09/30/2011    "1 then; makes a total of 4"   CORONARY ANGIOPLASTY WITH STENT PLACEMENT  11/11/11   "1; makes a total of 5"   INGUINAL HERNIA REPAIR  2003   right   JOINT REPLACEMENT Right 04/03/2002   hip replacment   KNEE ARTHROSCOPY  1990's   left   LEFT HEART CATHETERIZATION WITH CORONARY ANGIOGRAM N/A 09/30/2011   Procedure: LEFT HEART CATHETERIZATION WITH CORONARY ANGIOGRAM;  Surgeon: Jettie Booze, MD;  Location: [facility];  Service: Cardiovascular;  Laterality: N/A;  possible PCI   LEFT HEART CATHETERIZATION WITH CORONARY ANGIOGRAM N/A 11/15/2011   Procedure: LEFT HEART CATHETERIZATION WITH CORONARY ANGIOGRAM;  Surgeon: Jettie Booze, MD;  Location: [facility] C[facility] LAB;  Service: Cardiovascular;  Laterality: N/A;   PERCUTANEOUS CORONARY STENT INTERVENTION (PCI-S)  09/30/2011   Procedure: PERCUTANEOUS CORONARY STENT INTERVENTION (PCI-S);  Surgeon: Jettie Booze, MD;  Location: [facility] CATH LAB;  Service: Cardiovascular;;   PERCUTANEOUS CORONARY STENT INTERVENTION (PCI-S) N/A 11/11/2011   Procedure: PERCUTANEOUS CORONARY STENT INTERVENTION (PCI-S);  Surgeon: Jettie Booze, MD;  Location: [facility] CATH LAB;  Service: Cardiovascular;  Laterality: N/A;   PERIPHERAL VASCULAR BALLOON ANGIOPLASTY Right 08/08/2020   Procedure: PERIPHERAL VASCULAR BALLOON ANGIOPLASTY;  Surgeon: Angelia Mould, MD;  Location: [place] [facility];  Service: Cardiovascular;  Laterality: Right;  Posterior tibial    SHOULDER SURGERY Right    X 2   STUMP REVISION Right 01/09/2021   Procedure: REVISION RIGHT BELOW KNEE AMPUTATION;  Surgeon: [name], MD;  Location: [place];  Service: Orthopedics;  Laterality: Right;   TONSILLECTOMY  ~ [place] Right 04/13/2019   Procedure: RIGHT TOTAL HIP REVISION-POSTERIOR  APPROACH LATERAL;  Surgeon: Marybelle Killings, MD;  Location: [place];  Service: Orthopedics;  Laterality: Right;   TOTAL KNEE ARTHROPLASTY Left 07/23/2016   Procedure: LEFT TOTAL KNEE ARTHROPLASTY;   Surgeon: Marybelle Killings, MD;  Location: [place];  Service: Orthopedics;  Laterality: Left;   Social History:  reports that he has quit smoking. His smokeless tobacco use includes chew. He reports that he does not drink alcohol and does not use drugs. Family History:  Family History  Problem Relation Age of Onset   Diabetes Mother    Hyperlipidemia Mother    Hypertension Mother    Cancer Father    Hypertension Father    Diabetes Sister    Hypertension Sister    Cancer Brother    Heart attack Neg Hx      HOME MEDICATIONS: Allergies as of 06/30/2021  Reactions   Simvastatin Other (See Comments)   SEVERE MYALGIAS   Zetia [ezetimibe] Other (See Comments)   MYALGIAS   Dilaudid [hydromorphone Hcl] Other (See Comments)   hallucination        Medication List        Accurate as of June 30, 2021  2:30 PM. If you have any questions, ask your nurse or doctor.          STOP taking these medications    Levemir FlexTouch 100 UNIT/ML FlexPen Generic drug: insulin detemir Stopped by: Dorita Sciara, MD       TAKE these medications    acetaminophen 500 MG tablet Commonly known as: TYLENOL Take 1,000 mg by mouth every 6 (six) hours as needed (pain).   allopurinol 300 MG tablet Commonly known as: ZYLOPRIM Take 300 mg by mouth daily.   amLODipine 5 MG tablet Commonly known as: NORVASC Take 1 tablet (5 mg total) by mouth daily.   aspirin EC 81 MG tablet Take 81 mg by mouth daily.   B-12 2500 MCG Tabs Take 2,500 mcg by mouth daily.   clopidogrel 75 MG tablet Commonly known as: PLAVIX Take 1 tablet (75 mg total) by mouth daily.   Dexcom G6 Receiver Devi 1 Device by Does not apply route as directed.   Dexcom G6 Sensor Misc 1 Device by Does not apply route as directed.   Dexcom G6 Transmitter Misc 1 Device by Does not apply route as directed.   empagliflozin 25 MG Tabs tablet Commonly known as: JARDIANCE Take 1 tablet (25 mg total) by mouth  daily.   gabapentin 400 MG capsule Commonly known as: NEURONTIN Take 1 capsule (400 mg total) by mouth 3 (three) times daily. What changed: when to take this   gabapentin 100 MG capsule Commonly known as: NEURONTIN Take 2 capsules (200 mg total) by mouth at bedtime. What changed: how much to take   hydrochlorothiazide 12.5 MG tablet Commonly known as: HYDRODIURIL Take 1 tablet (12.5 mg total) by mouth daily.   Insulin Pen Needle 32G X 4 MM Misc 1 Device by Does not apply route daily in the afternoon.   isosorbide mononitrate 30 MG 24 hr tablet Commonly known as: IMDUR Take 1 tablet (30 mg total) by mouth daily. Please schedule yearly appointment for future refills. Thank you   linaclotide 145 MCG Caps capsule Commonly known as: LINZESS Take 1 capsule (145 mcg total) by mouth daily before breakfast.   losartan 50 MG tablet Commonly known as: COZAAR Take 1 tablet (50 mg total) by mouth daily.   metFORMIN 1000 MG tablet Commonly known as: GLUCOPHAGE Take 1 tablet (1,000 mg total) by mouth 2 (two) times daily.   methocarbamol 500 MG tablet Commonly known as: ROBAXIN Take 1 tablet (500 mg total) by mouth every 6 (six) hours as needed for muscle spasms.   nitroGLYCERIN 0.4 MG SL tablet Commonly known as: NITROSTAT Place 0.4 mg under the tongue every 5 (five) minutes x 3 doses as needed for chest pain.   ONE TOUCH ULTRA TEST test strip Generic drug: glucose blood CHECK BLOOD SUGAR ONCE DAILY AS DIRECTED   oxyCODONE 5 MG immediate release tablet Commonly known as: Oxy IR/ROXICODONE Take 1 tablet (5 mg total) by mouth every 4 (four) hours as needed for severe pain.   polyethylene glycol 17 g packet Commonly known as: MiraLax Take 17 g by mouth 2 (two) times daily.   potassium chloride SA 20 MEQ tablet Commonly known as: KLOR-CON  Take 1 tablet (20 mEq total) by mouth daily.   senna-docusate 8.6-50 MG tablet Commonly known as: Senokot-S Take 1 tablet by mouth at  bedtime.   terazosin 5 MG capsule Commonly known as: HYTRIN Take 1 capsule (5 mg total) by mouth at bedtime.   traMADol 50 MG tablet Commonly known as: ULTRAM Take 1 tablet (50 mg total) by mouth every 6 (six) hours as needed for moderate pain.   traZODone 50 MG tablet Commonly known as: DESYREL Take 1 tablet (50 mg total) by mouth at bedtime as needed for sleep. What changed: when to take this   [place] FlexTouch 100 UNIT/ML FlexTouch Pen Generic drug: insulin degludec Inject 9 Units into the skin daily. Started by: Dorita Sciara, MD   Vitamin D3 50 MCG (2000 UT) Tabs Take 2,000 Units by mouth daily.         OBJECTIVE:   Vital Signs: BP 118/72 (BP Location: Right Arm, Patient Position: Sitting, Cuff Size: Small)   Pulse 74   Ht 5\' 10"  (1.778 m)   Wt 148 lb 9.6 oz (67.4 kg)   SpO2 95%   BMI 21.32 kg/m   Wt Readings from Last 3 Encounters:  06/30/21 148 lb 9.6 oz (67.4 kg)  02/18/21 142 lb (64.4 kg)  02/18/21 142 lb 3.2 oz (64.5 kg)     Exam: General: Pt appears well and is in NAD  Neck: General: Supple without adenopathy. Thyroid: Thyroid size normal.  No goiter or nodules appreciated.   Lungs: Clear with good BS bilat with no rales, rhonchi, or wheezes  Heart: RRR  Extremities: No pretibial edema.   Neuro: MS is good with appropriate affect, pt is alert and Ox3    DM foot exam: 03/24/2021   Left BKA  The skin of the feet is intact without sores or ulcerations. The pedal pulse undetectable on the left  The sensation is decreased      DATA REVIEWED:  Lab Results  Component Value Date   HGBA1C 7.5 (A) 06/30/2021   HGBA1C 7.5 (H) 02/01/2021   HGBA1C 7.7 (H) 12/12/2020   Lab Results  Component Value Date   LDLCALC 58 04/06/2014   CREATININE 0.86 03/24/2021   No results found for: [facility]   Lab Results  Component Value Date   CHOL 117 04/06/2014   HDL 34 (L) 04/06/2014   LDLCALC 58 04/06/2014   TRIG 123 04/06/2014   CHOLHDL  3.4 04/06/2014       CT abdomen 01/31/2021    . Large amount of retained stool throughout the colon consistent with constipation. Large amount of stool within the rectal vault consistent with fecal impaction. 2. Punctate bilateral nonobstructing renal calculi. 3. Indeterminate 10 mm left adrenal nodule, likely adenoma. 4.  Aortic Atherosclerosis (ICD10-I70.0).   ASSESSMENT / PLAN / RECOMMENDATIONS:   1) Type 2 Diabetes Mellitus, Optimally controlled, With neuropathic and macrovascular  complications - Most recent A1c of 7.5 %. Goal A1c < 7.5 %.     - A1c remains stable  - He was discouraged from increasing basal insulin based on fasting BG readings  - I will switch Levemir to [place] , he will start with 8 units and increase to 9 units if needed     MEDICATIONS: Change Levemir to  [place], take  9  units daily  Continue Metformin 1000 mg Twice a day  Continue Jardiance 25 mg daily    EDUCATION / INSTRUCTIONS: BG monitoring instructions: Patient is instructed to check his blood  sugars 1 times a day, fasting . Call [facility] if: BG persistently < 70  I reviewed the Rule of 15 for the treatment of hypoglycemia in detail with the patient. Literature supplied.    2) Diabetic complications:  Eye:  Unknown to have diabetic retinopathy.  Neuro/ Feet: Does  have known diabetic peripheral neuropathy .  Renal: Patient does not have known baseline CKD. He   is  on an ACEI/ARB at present.    3) Left Adrenal Adenoma:   - Hormonal workup has ruled out cushing syndrome, Pheo and hyperaldo. - Will repeat adrenal imaging ~ 01/2022   F/U in 4 months     Signed electronically by: Mack Guise, MD  [facility]  [facility] [address] [place], [place] [address] Phone: [phone] FAX: [phone]   CC: [name], MD [address] [place] [address] Phone: [phone]  Fax:  [phone]  Return to Endocrinology clinic as below: Future Appointments  Date Time Provider [place]  06/30/2021  4:00 PM Isaias Cowman, PT [facility] None  07/02/2021 10:30 AM Rankin, Clent Demark, MD [facility] None  07/09/2021  4:00 PM Elsie Ra R, PT [facility] None  07/13/2021  4:00 PM Isaias Cowman, PT [facility] None  07/14/2021 11:45 AM Isaias Cowman, PT [facility] None  07/22/2021  3:15 PM Isaias Cowman, PT [facility] None  07/23/2021  4:00 PM Debbe Odea, PT [facility] None  07/27/2021  4:00 PM Debbe Odea, PT [facility] None  07/28/2021  3:15 PM Isaias Cowman, PT [facility] None  08/05/2021  3:15 PM Isaias Cowman, PT [facility] None  08/06/2021  4:00 PM Debbe Odea, PT [facility] None  08/10/2021 10:00 AM [name], MD [facility] None

## 2021-06-30 NOTE — Patient Instructions (Addendum)
-   Change Levemir to  [place], take  9  units daily  - Continue Metformin 1000 mg Twice a day  - Continue Jardiance 25 mg daily    HOW TO TREAT LOW BLOOD SUGARS (Blood sugar LESS THAN 70 MG/DL) Please follow the RULE OF 15 for the treatment of hypoglycemia treatment (when your (blood sugars are less than 70 mg/dL)   STEP 1: Take 15 grams of carbohydrates when your blood sugar is low, which includes:  3-4 GLUCOSE TABS  OR 3-4 OZ OF JUICE OR REGULAR SODA OR ONE TUBE OF GLUCOSE GEL    STEP 2: RECHECK blood sugar in 15 MINUTES STEP 3: If your blood sugar is still low at the 15 minute recheck --> then, go back to STEP 1 and treat AGAIN with another 15 grams of carbohydrates.

## 2021-06-30 NOTE — Therapy (Signed)
[facility] [address] [place], [place], [address] Phone: [phone]   Fax:  [phone]  Physical Therapy Treatment  Patient Details  Name: Keith Espinoza MRN: [id number] Date of Birth: [birth year] Referring Provider (PT): Meridee Score, MD   Encounter Date: 06/30/2021   PT End of Session - 06/30/21 1600     Visit Number 12    Number of Visits 25    Date for PT Re-Evaluation 07/30/21    Authorization Type Medicare [employer] and [employer]    Progress Note Due on Visit 20    PT Start Time 1559    PT Stop Time 1638    PT Time Calculation (min) 39 min    Equipment Utilized During Treatment Gait belt    Activity Tolerance Patient tolerated treatment well    Behavior During Therapy [facility] for tasks assessed/performed             Past Medical History:  Diagnosis Date   Allergic rhinitis    Allergic rhinitis    Arthritis    Basal cell carcinoma 11/01/2019    bcc left chest treatment TX cx3 30fu    Chronic leg pain    right   Chronic lower back pain    Coronary artery disease    a. Stenting to RCA 2004; staged DES to LAD and Cx 2004. DES to mRCA 2012. b. DES to mCx, PTCA to dCx 11/2011. c. Lateral wall MI 2013 s/p PTCA to distal Cx & DES to mid OM2 11/2011. d. Low risk nuc 04/2014, EF wnl.   COVID-19    Diabetes mellitus    Insulin dependent   Diabetic neuropathy ([place])    MILD   Diverticulosis    Dysrhythmia    [name] syndrome    Gout    right wrist; right foot; right elbow; have had it since 1970's   H/O hiatal hernia    Heart murmur    History of echocardiogram    aortic sclerosis per echo 12/09 EF 65%, otherwise normal   History of hemorrhoids    BLEEDING   History of kidney stones    h/o   Hypertension    Diagnosed 1995    Myocardial infarction [facility])    Pancreatic pseudocyst    a. s/p remote drainage 2006.   Thrombocytopenia ([place])    Seen on oldest labs in system from 2004   Vitamin B 12 deficiency    orally replaced     Past Surgical History:  Procedure Laterality Date   ABDOMINAL AORTOGRAM W/LOWER EXTREMITY Bilateral 08/08/2020   Procedure: ABDOMINAL AORTOGRAM W/LOWER EXTREMITY;  Surgeon: Angelia Mould, MD;  Location: [place] [facility];  Service: Cardiovascular;  Laterality: Bilateral;   AMPUTATION Right 12/12/2020   Procedure: RIGHT BELOW KNEE AMPUTATION;  Surgeon: [name], MD;  Location: [place];  Service: Orthopedics;  Laterality: Right;   BACK SURGERY     "total of 3 times" S/P fall    CARPAL TUNNEL RELEASE Bilateral    CHOLECYSTECTOMY  1990's   COLONOSCOPY     CORONARY ANGIOPLASTY  11/11/11   CORONARY ANGIOPLASTY WITH STENT PLACEMENT  09/30/2011   "1 then; makes a total of 4"   CORONARY ANGIOPLASTY WITH STENT PLACEMENT  11/11/11   "1; makes a total of 5"   INGUINAL HERNIA REPAIR  2003   right   JOINT REPLACEMENT Right 04/03/2002   hip replacment   KNEE ARTHROSCOPY  1990's   left   LEFT HEART CATHETERIZATION  WITH CORONARY ANGIOGRAM N/A 09/30/2011   Procedure: LEFT HEART CATHETERIZATION WITH CORONARY ANGIOGRAM;  Surgeon: Jettie Booze, MD;  Location: [facility] CATH LAB;  Service: Cardiovascular;  Laterality: N/A;  possible PCI   LEFT HEART CATHETERIZATION WITH CORONARY ANGIOGRAM N/A 11/15/2011   Procedure: LEFT HEART CATHETERIZATION WITH CORONARY ANGIOGRAM;  Surgeon: Jettie Booze, MD;  Location: [facility] CATH LAB;  Service: Cardiovascular;  Laterality: N/A;   PERCUTANEOUS CORONARY STENT INTERVENTION (PCI-S)  09/30/2011   Procedure: PERCUTANEOUS CORONARY STENT INTERVENTION (PCI-S);  Surgeon: Jettie Booze, MD;  Location: [facility] CATH LAB;  Service: Cardiovascular;;   PERCUTANEOUS CORONARY STENT INTERVENTION (PCI-S) N/A 11/11/2011   Procedure: PERCUTANEOUS CORONARY STENT INTERVENTION (PCI-S);  Surgeon: Jettie Booze, MD;  Location: [facility] CATH LAB;  Service: Cardiovascular;  Laterality: N/A;   PERIPHERAL VASCULAR BALLOON ANGIOPLASTY Right 08/08/2020   Procedure: PERIPHERAL VASCULAR  BALLOON ANGIOPLASTY;  Surgeon: Angelia Mould, MD;  Location: [place] [facility];  Service: Cardiovascular;  Laterality: Right;  Posterior tibial    SHOULDER SURGERY Right    X 2   STUMP REVISION Right 01/09/2021   Procedure: REVISION RIGHT BELOW KNEE AMPUTATION;  Surgeon: [name], MD;  Location: [place];  Service: Orthopedics;  Laterality: Right;   TONSILLECTOMY  ~ Sun Right 04/13/2019   Procedure: RIGHT TOTAL HIP REVISION-POSTERIOR  APPROACH LATERAL;  Surgeon: Marybelle Killings, MD;  Location: [place];  Service: Orthopedics;  Laterality: Right;   TOTAL KNEE ARTHROPLASTY Left 07/23/2016   Procedure: LEFT TOTAL KNEE ARTHROPLASTY;  Surgeon: Marybelle Killings, MD;  Location: [place];  Service: Orthopedics;  Laterality: Left;    There were no vitals filed for this visit.   Subjective Assessment - 06/30/21 1559     Subjective Diabetic doctor is switching to new med that last 24hrs.    Patient is accompained by: Family member   granddaughter   Pertinent History Rt TTA, arthritis, right hip replacement, left knee arthroplasty, shoulder sg X2, gout, basal cell CA, LBP, back sg X3, IDDM, neuropathy, diverticulosis, heart murmur, HTN, MI,    Limitations Standing;House hold activities;Walking    Patient Stated Goals walk in community & on farm, get on tractor, yard work, drive    Currently in Pain? No/denies    Pain Onset More than a month ago                               [facility] Adult PT Treatment/Exercise - 06/30/21 1558       Transfers   Transfers Sit to Stand;Stand to Sit    Sit to Stand 5: Supervision;With upper extremity assist;With armrests;From chair/3-in-1;Other (comment)   cane touch to stabilize   Stand to Sit 5: Supervision;With upper extremity assist;With armrests;To chair/3-in-1;Other (comment)   cane     Ambulation/Gait   Ambulation/Gait Yes    Ambulation/Gait Assistance 5: Supervision;4: Min guard   min guard cane & no device,   supervision RW   Ambulation Distance (Feet) 150 Feet   100' & 150' cane & 30' X 3 no device   Assistive device Straight cane;None;Prosthesis   cane stand alone tip.   Stairs Yes    Stairs Assistance 4: Min assist;4: Min guard    Stairs Assistance Details (indicate cue type and reason) PT demo & verbal cues on technique for alternating pattern with TTA prosthesis    Stair Management Technique Two rails;One rail Right;With cane;Forwards  progressed from 2 rails to one rail & cane   Number of Stairs 11   6 w/2rails & 5 w/cane & rail   Ramp 4: Min assist   cane stand alone tip & TTA prosthesis   Ramp Details (indicate cue type and reason) verbal cues on technique    Curb 4: Min assist   cane stand alone tip & TTA prosthesis   Curb Details (indicate cue type and reason) verbal cues on technique      High Level Balance   High Level Balance Comments with SPC ambulating sideways and retro 15' ea. with min guard.      Self-Care   Self-Care ADL's;Lifting    ADL's PT instructed in using prosthesis to enter & exit for showering.  Pt verbalized understanding.    Lifting PT demo & verbal cues on technique for picking up items from floor. Pt return demo with minA & verbal cues.      Knee/Hip Exercises: Stretches   Active Hamstring Stretch Both;2 reps;30 seconds    Active Hamstring Stretch Limitations supine w/strap      Knee/Hip Exercises: Aerobic   Nustep L7 X 8 min UE/LE seat 12      Knee/Hip Exercises: Machines for Strengthening   Total Gym Leg Press BLEs 93# 15 reps 2 sets 1st set back 45* & 2nd set back flat      Prosthetics   Prosthetic Care Comments  PT reviewed increasing wear to 6hrs 2x/day starting within 30 min of arising and off for 2hrs midday    Current prosthetic wear tolerance (days/week)  daily    Current prosthetic wear tolerance (#hours/day)  4-5hrs 2x/day    Current prosthetic weight-bearing tolerance (hours/day)  Pt tolerating weight bearing with cane up to 100' and no  device 30' without limb pain.    Residual limb condition  both scabs have come off wounds. Distal wound with complete granulation closure. Tibial crest wound has granualtion forming with no signs of infection. Size of wound is smaller.  Tibial crest appears to have some bone spurs forming in area.  color & temperature normal. minimal to no hair growth. cylinderical shape.    Education Provided Skin check;Residual limb care;Proper wear schedule/adjustment    Person(s) Educated Patient    Education Method Explanation;Verbal cues    Education Method Verbalized understanding;Verbal cues required    Donning Prosthesis Modified independent (device/increased time)                       PT Short Term Goals - 06/29/21 1652       PT SHORT TERM GOAL #1   Title Patient verbalizes adjusting ply socks for prosthetic fit.    Status New    Target Date 07/09/21      PT SHORT TERM GOAL #2   Title Patient tolerates prosthesis >12 hrs total /day without increase in skin issues    Status Revised    Target Date 07/09/21      PT SHORT TERM GOAL #3   Title Patient able to pick up item from floor without UE support with supervision.    Status Revised    Target Date 07/09/21      PT SHORT TERM GOAL #4   Title Patient ambulates 200' with cane & prosthesis with supervision & no loss of balance.    Status Revised    Target Date 07/09/21      PT SHORT TERM GOAL #5   Title Patient negotiates  ramps & curbs with cane & prosthesis with min guard.    Status Revised    Target Date 07/09/21               PT Long Term Goals - 05/18/21 1023       PT LONG TERM GOAL #1   Title Patient demonstrates & verbalized understanding of prosthetic care to enable safe utilization of prosthesis.    Time 12    Period Weeks    Status On-going    Target Date 07/30/21      PT LONG TERM GOAL #2   Title Patient tolerates prosthesis wear >90% of awake hours without skin or limb pain issues.    Time 12     Period Weeks    Status On-going    Target Date 07/30/21      PT LONG TERM GOAL #3   Title Berg Balance >/= 36/56 to indicate lower fall risk    Time 12    Period Weeks    Status On-going    Target Date 07/30/21      PT LONG TERM GOAL #4   Title Patient ambulates 300' with LRAD & prosthesis modified independent    Time 12    Period Weeks    Status On-going    Target Date 07/30/21      PT LONG TERM GOAL #5   Title Patient negotiates ramps, curbs & stairs with LRAD & prosthesis modified independent.    Time 12    Period Weeks    Status On-going    Target Date 07/30/21                   Plan - 06/30/21 1601     Clinical Impression Statement PT instructed in negotiating stairs with alternating pattern and picking up items from floor with TTA prosthesis technique. He did well for first attempt but required assist.  Patient is improving with prosthetic gait with cane.    Personal Factors and Comorbidities Comorbidity 3+;Age;Time since onset of injury/illness/exacerbation    Comorbidities Rt TTA, arthritis, right hip replacement, left knee arthroplasty, shoulder sg X2, gout, basal cell CA, LBP, back sg X3, IDDM, neuropathy, diverticulosis, heart murmur, HTN, MI,    Examination-Activity Limitations Lift;Locomotion Level;Squat;Stairs;Stand;Transfers    Examination-Participation Restrictions Community Activity;Yard Work    [employer] Evolving/Moderate complexity    Rehab Potential Good    PT Frequency 2x / week    PT Duration 12 weeks    PT Treatment/Interventions ADLs/Self Care Home Management;DME Instruction;Gait training;Stair training;Functional mobility training;Therapeutic activities;Therapeutic exercise;Balance training;Neuromuscular re-education;Patient/family education;Prosthetic Training;Vestibular;Passive range of motion    PT Next Visit Plan standing balance,progress prosthetic gait with cane including ramps & curbs,  check wounds, work towards  updated STGs    Consulted and Agree with Plan of Care Patient             Patient will benefit from skilled therapeutic intervention in order to improve the following deficits and impairments:  Abnormal gait, Decreased activity tolerance, Decreased balance, Decreased endurance, Decreased knowledge of use of DME, Decreased mobility, Decreased range of motion, Decreased skin integrity, Decreased scar mobility, Decreased strength, Increased edema, Impaired flexibility, Postural dysfunction, Prosthetic Dependency  Visit Diagnosis: Other abnormalities of gait and mobility  Unsteadiness on feet  Abnormal posture  Muscle weakness (generalized)  Stiffness of right knee, not elsewhere classified     Problem List Patient Active Problem List   Diagnosis Date Noted   Exudative age-related macular degeneration of  right eye with active choroidal neovascularization ([place]) 06/01/2021   Intermediate stage nonexudative age-related macular degeneration of both eyes 06/01/2021   Type 2 diabetes mellitus with diabetic polyneuropathy, with long-term current use of insulin ([place]) 03/24/2021   Hypokalemia    Constipation 02/02/2021   Fecal impaction ([place]) 02/01/2021   Wound dehiscence 01/09/2021   Dehiscence of amputation stump ([place])    Status post percutaneous transluminal coronary angioplasty 01/06/2021   Type II diabetes mellitus, uncontrolled ([place]) 01/06/2021   Acute pancreatitis 01/06/2021   Diabetic peripheral vascular disease ([place]) 01/06/2021   Encounter for screening for other disorder 01/06/2021   Enlarged prostate 01/06/2021   Foot ulcer, right ([place]) 01/06/2021   Gout 01/06/2021   Headache 01/06/2021   Neck pain 01/06/2021   Hypoglycemia 01/06/2021   Loss of appetite 01/06/2021   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   Peripheral neuropathy 01/06/2021   Sciatica 01/06/2021   Vitamin B12 deficiency 01/06/2021   Vitamin D deficiency 01/06/2021   Weakness 01/06/2021   Diabetes  mellitus type 2 with neurological manifestations ([place]) 01/06/2021   Protein-calorie malnutrition, severe 12/26/2020   Acute blood loss anemia 12/26/2020   Prerenal azotemia 12/26/2020   Below-knee amputation of right lower extremity ([place]) 12/12/2020   Gangrene of right foot ([place])    Diabetic neuropathy ([place]) 11/17/2020   Hyperglycemia due to type 2 diabetes mellitus ([place]) 11/17/2020   Long term (current) use of insulin ([place]) 11/17/2020   Obesity 11/17/2020   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/2019   Hip dislocation, right ([place]) 04/13/2019   Other intervertebral disc degeneration, lumbar region 03/30/2019   CAD (coronary artery disease) 01/30/2019   Tobacco abuse 01/30/2019   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   Burn, foot, second degree, left, initial encounter 06/08/2017   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 [place], [place], [address] Phone: [phone]   Fax:  [phone]  Name: Keith Espinoza MRN: [id number] Date of Birth: [birth year]

## 2021-07-02 ENCOUNTER — Ambulatory Visit (INDEPENDENT_AMBULATORY_CARE_PROVIDER_SITE_OTHER): Payer: Medicare Other | Admitting: Ophthalmology

## 2021-07-02 ENCOUNTER — Other Ambulatory Visit: Payer: Self-pay

## 2021-07-02 DIAGNOSIS — H353211 Exudative age-related macular degeneration, right eye, with active choroidal neovascularization: Secondary | ICD-10-CM | POA: Diagnosis not present

## 2021-07-02 MED ORDER — BEVACIZUMAB 2.5 MG/0.1ML IZ SOSY
2.5000 mg | PREFILLED_SYRINGE | INTRAVITREAL | Status: AC | PRN
  Administered 2021-07-02: 2.5 mg via INTRAVITREAL

## 2021-07-02 NOTE — Assessment & Plan Note (Signed)
OD, improved anatomy yet still active with intraretinal fluid CNVM OD threatening acuity changes, will repeat injection today Avastin examination again OD in 5 weeks

## 2021-07-02 NOTE — Progress Notes (Signed)
07/02/2021     CHIEF COMPLAINT Patient presents for  Chief Complaint  Patient presents with   Retina Follow Up      HISTORY OF PRESENT ILLNESS: Keith Espinoza is a 80 y.o. male who presents to the clinic today for:   HPI     Retina Follow Up   Patient presents with  Wet AMD.  This started 4 weeks ago.  Severity is mild.  Duration of 4 weeks.  Since onset it is gradually worsening.        Comments   4 weeks, 3 days fu OD oct avastin OD. Pt states "on the right outer corner of my eye I notice a black shadow covering my vision, but when I go to look at it when I move my eye I don't see it. It started after the last visit. It started 2 weeks after my last visit. It is like a veil shadow on that side of my eye." Pt's daughter reports again, when cutting grass at home he is missing spots. Pt denies any decrease in vision. Pt denies any flashes of light or new floaters.  LBS: 200 this morning before eating. A1C: 7.5 on 06/30/2021      Last edited by Laurin Coder on 07/02/2021 10:46 AM.      Referring physician: [name], MD [address] [place],  [place] [address]  HISTORICAL INFORMATION:   Selected notes from the MEDICAL RECORD NUMBER    Lab Results  Component Value Date   HGBA1C 7.5 (A) 06/30/2021     CURRENT MEDICATIONS: No current outpatient medications on file. (Ophthalmic Drugs)   No current facility-administered medications for this visit. (Ophthalmic Drugs)   Current Outpatient Medications (Other)  Medication Sig   acetaminophen (TYLENOL) 500 MG tablet Take 1,000 mg by mouth every 6 (six) hours as needed (pain).   allopurinol (ZYLOPRIM) 300 MG tablet Take 300 mg by mouth daily.   amLODipine (NORVASC) 5 MG tablet Take 1 tablet (5 mg total) by mouth daily.   aspirin EC 81 MG tablet Take 81 mg by mouth daily.   Cholecalciferol (VITAMIN D3) 50 MCG (2000 UT) TABS Take 2,000 Units by mouth daily.    clopidogrel (PLAVIX) 75 MG tablet Take  1 tablet (75 mg total) by mouth daily.   Continuous Blood Gluc Receiver (DEXCOM G6 RECEIVER) DEVI 1 Device by Does not apply route as directed.   Continuous Blood Gluc Sensor (DEXCOM G6 SENSOR) MISC 1 Device by Does not apply route as directed.   Continuous Blood Gluc Transmit (DEXCOM G6 TRANSMITTER) MISC 1 Device by Does not apply route as directed.   Cyanocobalamin (B-12) 2500 MCG TABS Take 2,500 mcg by mouth daily.   empagliflozin (JARDIANCE) 25 MG TABS tablet Take 1 tablet (25 mg total) by mouth daily.   gabapentin (NEURONTIN) 100 MG capsule Take 2 capsules (200 mg total) by mouth at bedtime. (Patient taking differently: Take 600 mg by mouth at bedtime.)   gabapentin (NEURONTIN) 400 MG capsule Take 1 capsule (400 mg total) by mouth 3 (three) times daily. (Patient taking differently: Take 400 mg by mouth daily.)   hydrochlorothiazide (HYDRODIURIL) 12.5 MG tablet Take 1 tablet (12.5 mg total) by mouth daily.   insulin degludec (TRESIBA FLEXTOUCH) 100 UNIT/ML FlexTouch Pen Inject 9 Units into the skin daily.   Insulin Pen Needle 32G X 4 MM MISC 1 Device by Does not apply route daily in the afternoon.   isosorbide mononitrate (IMDUR) 30  MG 24 hr tablet Take 1 tablet (30 mg total) by mouth daily. Please schedule yearly appointment for future refills. Thank you   linaclotide (LINZESS) 145 MCG CAPS capsule Take 1 capsule (145 mcg total) by mouth daily before breakfast.   losartan (COZAAR) 50 MG tablet Take 1 tablet (50 mg total) by mouth daily.   metFORMIN (GLUCOPHAGE) 1000 MG tablet Take 1 tablet (1,000 mg total) by mouth 2 (two) times daily.   methocarbamol (ROBAXIN) 500 MG tablet Take 1 tablet (500 mg total) by mouth every 6 (six) hours as needed for muscle spasms.   nitroGLYCERIN (NITROSTAT) 0.4 MG SL tablet Place 0.4 mg under the tongue every 5 (five) minutes x 3 doses as needed for chest pain.    ONE TOUCH ULTRA TEST test strip CHECK BLOOD SUGAR ONCE DAILY AS DIRECTED   oxyCODONE (OXY  IR/ROXICODONE) 5 MG immediate release tablet Take 1 tablet (5 mg total) by mouth every 4 (four) hours as needed for severe pain.   polyethylene glycol (MIRALAX) 17 g packet Take 17 g by mouth 2 (two) times daily.   potassium chloride SA (KLOR-CON) 20 MEQ tablet Take 1 tablet (20 mEq total) by mouth daily.   senna-docusate (SENOKOT-S) 8.6-50 MG tablet Take 1 tablet by mouth at bedtime.   terazosin (HYTRIN) 5 MG capsule Take 1 capsule (5 mg total) by mouth at bedtime.   traMADol (ULTRAM) 50 MG tablet Take 1 tablet (50 mg total) by mouth every 6 (six) hours as needed for moderate pain.   traZODone (DESYREL) 50 MG tablet Take 1 tablet (50 mg total) by mouth at bedtime as needed for sleep. (Patient taking differently: Take 50 mg by mouth 2 (two) times daily.)   No current facility-administered medications for this visit. (Other)      REVIEW OF SYSTEMS:    ALLERGIES Allergies  Allergen Reactions   Simvastatin Other (See Comments)    SEVERE MYALGIAS    Zetia [Ezetimibe] Other (See Comments)    MYALGIAS   Dilaudid [Hydromorphone Hcl] Other (See Comments)    hallucination    PAST MEDICAL HISTORY Past Medical History:  Diagnosis Date   Allergic rhinitis    Allergic rhinitis    Arthritis    Basal cell carcinoma 11/01/2019    bcc left chest treatment TX cx3 50fu    Chronic leg pain    right   Chronic lower back pain    Coronary artery disease    a. Stenting to RCA 2004; staged DES to LAD and Cx 2004. DES to mRCA 2012. b. DES to mCx, PTCA to dCx 11/2011. c. Lateral wall MI 2013 s/p PTCA to distal Cx & DES to mid OM2 11/2011. d. Low risk nuc 04/2014, EF wnl.   COVID-19    Diabetes mellitus    Insulin dependent   Diabetic neuropathy ([place])    MILD   Diverticulosis    Dysrhythmia    [name] syndrome    Gout    right wrist; right foot; right elbow; have had it since 1970's   H/O hiatal hernia    Heart murmur    History of echocardiogram    aortic sclerosis per echo 12/09 EF 65%,  otherwise normal   History of hemorrhoids    BLEEDING   History of kidney stones    h/o   Hypertension    Diagnosed 1995    Myocardial infarction [facility])    Pancreatic pseudocyst    a. s/p remote drainage 2006.   Thrombocytopenia ([place])  Seen on oldest labs in system from 2004   Vitamin B 12 deficiency    orally replaced   Past Surgical History:  Procedure Laterality Date   ABDOMINAL AORTOGRAM W/LOWER EXTREMITY Bilateral 08/08/2020   Procedure: ABDOMINAL AORTOGRAM W/LOWER EXTREMITY;  Surgeon: Angelia Mould, MD;  Location: [place] [facility];  Service: Cardiovascular;  Laterality: Bilateral;   AMPUTATION Right 12/12/2020   Procedure: RIGHT BELOW KNEE AMPUTATION;  Surgeon: [name], MD;  Location: [facility];  Service: Orthopedics;  Laterality: Right;   BACK SURGERY     "total of 3 times" S/P fall    CARPAL TUNNEL RELEASE Bilateral    CHOLECYSTECTOMY  1990's   COLONOSCOPY     CORONARY ANGIOPLASTY  11/11/11   CORONARY ANGIOPLASTY WITH STENT PLACEMENT  09/30/2011   "1 then; makes a total of 4"   CORONARY ANGIOPLASTY WITH STENT PLACEMENT  11/11/11   "1; makes a total of 5"   INGUINAL HERNIA REPAIR  2003   right   JOINT REPLACEMENT Right 04/03/2002   hip replacment   KNEE ARTHROSCOPY  1990's   left   LEFT HEART CATHETERIZATION WITH CORONARY ANGIOGRAM N/A 09/30/2011   Procedure: LEFT HEART CATHETERIZATION WITH CORONARY ANGIOGRAM;  Surgeon: Jettie Booze, MD;  Location: [facility] CATH LAB;  Service: Cardiovascular;  Laterality: N/A;  possible PCI   LEFT HEART CATHETERIZATION WITH CORONARY ANGIOGRAM N/A 11/15/2011   Procedure: LEFT HEART CATHETERIZATION WITH CORONARY ANGIOGRAM;  Surgeon: Jettie Booze, MD;  [facility] LAB;  Service: Cardiovascular;  Laterality: N/A;   PERCUTANEOUS CORONARY STENT INTERVENTION (PCI-S)  09/30/2011   Procedure: PERCUTANEOUS CORONARY STENT INTERVENTION (PCI-S);  Surgeon: Jettie Booze, MD;  Location: [facility] CATH LAB;  Service:  Cardiovascular;;   PERCUTANEOUS CORONARY STENT INTERVENTION (PCI-S) N/A 11/11/2011   Procedure: PERCUTANEOUS CORONARY STENT INTERVENTION (PCI-S);  Surgeon: Jettie Booze, MD;  Location: [facility] CATH LAB;  Service: Cardiovascular;  Laterality: N/A;   PERIPHERAL VASCULAR BALLOON ANGIOPLASTY Right 08/08/2020   Procedure: PERIPHERAL VASCULAR BALLOON ANGIOPLASTY;  Surgeon: Angelia Mould, MD;  Location: [place] [facility];  Service: Cardiovascular;  Laterality: Right;  Posterior tibial    SHOULDER SURGERY Right    X 2   STUMP REVISION Right 01/09/2021   Procedure: REVISION RIGHT BELOW KNEE AMPUTATION;  Surgeon: [name], MD;  Location: [facility];  Service: Orthopedics;  Laterality: Right;   TONSILLECTOMY  ~ [place] Right 04/13/2019   Procedure: RIGHT TOTAL HIP REVISION-POSTERIOR  APPROACH LATERAL;  Surgeon: Marybelle Killings, MD;  Location: [facility];  Service: Orthopedics;  Laterality: Right;   TOTAL KNEE ARTHROPLASTY Left 07/23/2016   Procedure: LEFT TOTAL KNEE ARTHROPLASTY;  Surgeon: Marybelle Killings, MD;  Location: [place] [facility];  Service: Orthopedics;  Laterality: Left;    FAMILY HISTORY Family History  Problem Relation Age of Onset   Diabetes Mother    Hyperlipidemia Mother    Hypertension Mother    Cancer Father    Hypertension Father    Diabetes Sister    Hypertension Sister    Cancer Brother    Heart attack Neg Hx     SOCIAL HISTORY Social History   Tobacco Use   Smoking status: Former   Smokeless tobacco: Current    Types: Chew   Tobacco comments:    quit 60 years ago  Vaping Use   Vaping Use: Never used  Substance Use Topics   Alcohol use: No   Drug use: No  OPHTHALMIC EXAM:  Base Eye Exam     Visual Acuity (ETDRS)       Right Left   Dist cc 20/20 -1 20/30 -2    Correction: Glasses         Tonometry (Tonopen, 10:50 AM)       Right Left   Pressure 13 11         Pupils       Pupils Dark Light APD   Right PERRL 3 2 None   Left  PERRL 3 2 None         Extraocular Movement       Right Left    Full Full         Neuro/Psych     Oriented x3: Yes   Mood/Affect: Normal         Dilation     Right eye: 1.0% Mydriacyl, 2.5% Phenylephrine @ 10:50 AM           Slit Lamp and Fundus Exam     External Exam       Right Left   External Normal Normal         Slit Lamp Exam       Right Left   Lids/Lashes Normal Normal   Conjunctiva/Sclera White and quiet White and quiet   Cornea Clear Clear   Anterior Chamber Deep and quiet Deep and quiet   Iris Round and reactive Round and reactive   Lens Centered posterior chamber intraocular lens Centered posterior chamber intraocular lens   Anterior Vitreous Normal Normal         Fundus Exam       Right Left   Posterior Vitreous Normal    Disc Normal    C/D Ratio 2.5    Macula Retinal pigment epithelial mottling, Intermediate age related macular degeneration    Vessels Normal, no DR    Periphery Normal             IMAGING AND PROCEDURES  Imaging and Procedures for 07/02/21  OCT, Retina - OU - Both Eyes       Right Eye Quality was borderline. Scan locations included subfoveal. Central Foveal Thickness: 260. Progression has worsened. Findings include abnormal foveal contour, subretinal scarring, inner retinal atrophy, intraretinal hyper-reflective material, intraretinal fluid.   Left Eye Quality was borderline. Scan locations included subfoveal. Central Foveal Thickness: 245. Progression has been stable. Findings include abnormal foveal contour, retinal drusen .   Notes OD with new onset intraretinal fluid inferior to the foveal avascular zone threatening acuity.  We will repeat injection intravitreal Avastin today to maintain extension of disease     Intravitreal Injection, Pharmacologic Agent - OD - Right Eye       Time Out 07/02/2021. 11:08 AM. Confirmed correct patient, procedure, site, and patient consented.    Anesthesia Topical anesthesia was used. Anesthetic medications included Akten 3.5%.   Procedure Preparation included Tobramycin 0.3%, 10% betadine to eyelids, 5% betadine to ocular surface. A 30 gauge needle was used.   Injection: 2.5 mg bevacizumab 2.5 MG/0.1ML   Route: Intravitreal, Site: Right Eye   NDC: [phone], Lot: [id number]   Post-op Post injection exam found visual acuity of at least counting fingers. The patient tolerated the procedure well. There were no complications. The patient received written and verbal post procedure care education. Post injection medications included ocuflox.              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 Exudative age-related macular degeneration of right eye with active choroidal neovascularization (H[facility])  H35.3211 OCT, Retina - OU - Both Eyes    Intravitreal Injection, Pharmacologic Agent - OD - Right Eye    bevacizumab (AVASTIN) SOSY 2.5 mg      1.  OD, with extensive CNVM in the past now post Avastin No. 1 slightly improved and we will repeat injection today to maintain.  Follow-up next in 5-week  2.  3.  Ophthalmic Meds Ordered this visit:  Meds ordered this encounter  Medications   bevacizumab (AVASTIN) SOSY 2.5 mg       Return in about 5 weeks (around 08/06/2021) for dilate, OD, AVASTIN OCT.  There are no Patient Instructions on file for this visit.   Explained the diagnoses, plan, and follow up with the patient and they expressed understanding.  Patient expressed understanding of the importance of proper follow up care.   Clent Demark Jovante Hammitt M.D. Diseases & Surgery of the Retina and Vitreous [facility] [place] 07/02/21     Abbreviations: M myopia (nearsighted); A astigmatism; H hyperopia (farsighted); P presbyopia; Mrx spectacle prescription;  CTL contact lenses; OD right eye; OS left eye; OU both eyes  XT exotropia; ET esotropia; PEK punctate epithelial keratitis; PEE punctate epithelial erosions; DES dry eye syndrome; MGD meibomian gland dysfunction; ATs artificial tears; PFAT's preservative free artificial tears; [place] nuclear sclerotic cataract; PSC posterior subcapsular cataract; ERM epi-retinal membrane; PVD posterior vitreous detachment; RD retinal detachment; DM diabetes mellitus; DR diabetic retinopathy; NPDR non-proliferative diabetic retinopathy; PDR proliferative diabetic retinopathy; CSME clinically significant macular edema; DME diabetic macular edema; dbh dot blot hemorrhages; CWS cotton wool spot; POAG primary open angle glaucoma; C/D cup-to-disc ratio; HVF humphrey visual field; GVF goldmann visual field; OCT optical coherence tomography; IOP intraocular pressure; BRVO Branch retinal vein occlusion; CRVO central retinal vein occlusion; CRAO central retinal artery occlusion; BRAO branch retinal artery occlusion; RT retinal tear; SB scleral buckle; PPV pars plana vitrectomy; VH Vitreous hemorrhage; PRP panretinal laser photocoagulation; IVK intravitreal kenalog; VMT vitreomacular traction; MH Macular hole;  NVD neovascularization of the disc; NVE neovascularization elsewhere; AREDS age related eye disease study; ARMD age related macular degeneration; POAG primary open angle glaucoma; EBMD epithelial/anterior basement membrane dystrophy; ACIOL anterior chamber intraocular lens; IOL intraocular lens; PCIOL posterior chamber intraocular lens; Phaco/IOL phacoemulsification with intraocular lens placement; [place] photorefractive keratectomy; LASIK laser assisted in situ keratomileusis; HTN hypertension; DM diabetes mellitus; COPD chronic obstructive pulmonary disease

## 2021-07-08 ENCOUNTER — Ambulatory Visit (INDEPENDENT_AMBULATORY_CARE_PROVIDER_SITE_OTHER): Payer: Medicare Other | Admitting: Physical Therapy

## 2021-07-08 ENCOUNTER — Other Ambulatory Visit: Payer: Self-pay

## 2021-07-08 ENCOUNTER — Encounter: Payer: Self-pay | Admitting: Physical Therapy

## 2021-07-08 DIAGNOSIS — M25661 Stiffness of right knee, not elsewhere classified: Secondary | ICD-10-CM | POA: Diagnosis not present

## 2021-07-08 DIAGNOSIS — R2681 Unsteadiness on feet: Secondary | ICD-10-CM

## 2021-07-08 DIAGNOSIS — R2689 Other abnormalities of gait and mobility: Secondary | ICD-10-CM

## 2021-07-08 DIAGNOSIS — M6281 Muscle weakness (generalized): Secondary | ICD-10-CM | POA: Diagnosis not present

## 2021-07-08 DIAGNOSIS — R293 Abnormal posture: Secondary | ICD-10-CM

## 2021-07-08 NOTE — Therapy (Signed)
[facility] Physical Therapy [address] [place], [place], [address] Phone: [phone]   Fax:  [phone]  Physical Therapy Treatment  Patient Details  Name: Keith Espinoza MRN: [id number] Date of Birth: 1941-03-27 Referring Provider (PT): Meridee Score, MD   Encounter Date: 07/08/2021   PT End of Session - 07/08/21 1601     Visit Number 13    Number of Visits 25    Date for PT Re-Evaluation 07/30/21    Authorization Type Medicare A[employer] and [employer]    Progress Note Due on Visit 20    PT Start Time 1559    PT Stop Time 1642    PT Time Calculation (min) 43 min    Equipment Utilized During Treatment Gait belt    Activity Tolerance Patient tolerated treatment well    Behavior During Therapy [facility] for tasks assessed/performed             Past Medical History:  Diagnosis Date   Allergic rhinitis    Allergic rhinitis    Arthritis    Basal cell carcinoma 11/01/2019    bcc left chest treatment TX cx3 40f    Chronic leg pain    right   Chronic lower back pain    Coronary artery disease    a. Stenting to RCA 2004; staged DES to LAD and Cx 2004. DES to mRCA 2012. b. DES to mCx, PTCA to dCx 11/2011. c. Lateral wall MI 2013 s/p PTCA to distal Cx & DES to mid OM2 11/2011. d. Low risk nuc 04/2014, EF wnl.   COVID-19    Diabetes mellitus    Insulin dependent   Diabetic neuropathy ([facility]    MILD   Diverticulosis    Dysrhythmia    GRosanna Randysyndrome    Gout    right wrist; right foot; right elbow; have had it since 1970's   H/O hiatal hernia    Heart murmur    History of echocardiogram    aortic sclerosis per echo 12/09 EF 65%, otherwise normal   History of hemorrhoids    BLEEDING   History of kidney stones    h/o   Hypertension    Diagnosed 1995    Myocardial infarction ([facility]    Pancreatic pseudocyst    a. s/p remote drainage 2006.   [place]    Seen on oldest labs in system from 2004   Vitamin B 12 deficiency    orally replaced     Past Surgical History:  Procedure Laterality Date   ABDOMINAL AORTOGRAM W/LOWER EXTREMITY Bilateral 08/08/2020   Procedure: ABDOMINAL AORTOGRAM W/LOWER EXTREMITY;  Surgeon: DAngelia Mould MD;  [facility];  Service: Cardiovascular;  Laterality: Bilateral;   AMPUTATION Right 12/12/2020   Procedure: RIGHT BELOW KNEE AMPUTATION;  Surgeon: DNewt Minion MD;  [place]  Service: Orthopedics;  Laterality: Right;   BACK SURGERY     "total of 3 times" S/P fall    CARPAL TUNNEL RELEASE Bilateral    CHOLECYSTECTOMY  1990's   COLONOSCOPY     CORONARY ANGIOPLASTY  11/11/11   CORONARY ANGIOPLASTY WITH STENT PLACEMENT  09/30/2011   "1 then; makes a total of 4"   CORONARY ANGIOPLASTY WITH STENT PLACEMENT  11/11/11   "1; makes a total of 5"   INGUINAL HERNIA REPAIR  2003   right   JOINT REPLACEMENT Right 04/03/2002   hip replacment   KNEE ARTHROSCOPY  1990's   left   LEFT HEART CATHETERIZATION  WITH CORONARY ANGIOGRAM N/A 09/30/2011   Procedure: LEFT HEART CATHETERIZATION WITH CORONARY ANGIOGRAM;  Surgeon: Jettie Booze, MD;  Location: [facility] CATH LAB;  Service: Cardiovascular;  Laterality: N/A;  possible PCI   LEFT HEART CATHETERIZATION WITH CORONARY ANGIOGRAM N/A 11/15/2011   Procedure: LEFT HEART CATHETERIZATION WITH CORONARY ANGIOGRAM;  Surgeon: Jettie Booze, MD;  Location: [facility] CATH LAB;  Service: Cardiovascular;  Laterality: N/A;   PERCUTANEOUS CORONARY STENT INTERVENTION (PCI-S)  09/30/2011   Procedure: PERCUTANEOUS CORONARY STENT INTERVENTION (PCI-S);  Surgeon: Jettie Booze, MD;  Location: [facility] CATH LAB;  Service: Cardiovascular;;   PERCUTANEOUS CORONARY STENT INTERVENTION (PCI-S) N/A 11/11/2011   Procedure: PERCUTANEOUS CORONARY STENT INTERVENTION (PCI-S);  Surgeon: Jettie Booze, MD;  Location: [facility] CATH LAB;  Service: Cardiovascular;  Laterality: N/A;   PERIPHERAL VASCULAR BALLOON ANGIOPLASTY Right 08/08/2020   Procedure: PERIPHERAL VASCULAR  BALLOON ANGIOPLASTY;  Surgeon: Angelia Mould, MD;  Location: [place] CV LAB;  Service: Cardiovascular;  Laterality: Right;  Posterior tibial    SHOULDER SURGERY Right    X 2   STUMP REVISION Right 01/09/2021   Procedure: REVISION RIGHT BELOW KNEE AMPUTATION;  Surgeon: [name], MD;  Location: [place];  Service: Orthopedics;  Laterality: Right;   TONSILLECTOMY  ~ [place] Right 04/13/2019   Procedure: RIGHT TOTAL HIP REVISION-POSTERIOR  APPROACH LATERAL;  Surgeon: Marybelle Killings, MD;  Location: [place];  Service: Orthopedics;  Laterality: Right;   TOTAL KNEE ARTHROPLASTY Left 07/23/2016   Procedure: LEFT TOTAL KNEE ARTHROPLASTY;  Surgeon: Marybelle Killings, MD;  Location: [place];  Service: Orthopedics;  Laterality: Left;    There were no vitals filed for this visit.   Subjective Assessment - 07/08/21 1559     Subjective He wears prosthesis most of awake hours with rest leg including silicon liner for 26-94 minutes midday.  He feels wound is better.  No falls.    Patient is accompained by: Family member   granddaughter   Pertinent History Rt TTA, arthritis, right hip replacement, left knee arthroplasty, shoulder sg X2, gout, basal cell CA, LBP, back sg X3, IDDM, neuropathy, diverticulosis, heart murmur, HTN, MI,    Limitations Standing;House hold activities;Walking    Patient Stated Goals walk in community & on farm, get on tractor, yard work, drive    Currently in Pain? No/denies    Pain Onset More than a month ago                     Vestibular Assessment - 07/08/21 0001       Positional Testing   Dix-Hallpike Dix-Hallpike Right;Dix-Hallpike Left      Dix-Hallpike Right   Dix-Hallpike Right Duration negative down & Up      Dix-Hallpike Left   Dix-Hallpike Left Duration negative down & Up                      [facility] Adult PT Treatment/Exercise - 07/08/21 1559       Transfers   Transfers Sit to Stand;Stand to Sit    Sit to Stand  5: Supervision;With upper extremity assist;With armrests;From chair/3-in-1;Other (comment)   cane touch to stabilize   Stand to Sit 5: Supervision;With upper extremity assist;With armrests;To chair/3-in-1;Other (comment)   cane     Ambulation/Gait   Ambulation/Gait Yes    Ambulation/Gait Assistance 5: Supervision   cane   Ambulation Distance (Feet) 250 Feet    Assistive device Straight cane;Prosthesis  cane stand alone tip.   Ramp 4: Min assist   cane stand alone tip & TTA prosthesis   Ramp Details (indicate cue type and reason) tactile & verbal cues on technique    Curb 4: Min assist   min guard cane stand alone tip & TTA prosthesis   Curb Details (indicate cue type and reason) tactile & verbal cues on technique    Pre-Gait Activities in //bars (not touching //bars) using mirror & markers on floor with step through pattern - initially with cane supervision (10 reps) then no device with min guard (10 reps). stance on prosthesis with LLE stepping forward & back with step through pattern.      High Level Balance   High Level Balance Activities Side stepping;Turns   90* turns, bar stool sit>stand turn 360* tracking eyes ahead stand>sit 2 reps ea directions,   High Level Balance Comments in //bars without UE support; verbal, demo & tactile cues on technique.      Self-Care   Self-Care ADL's;Lifting    Lifting Pt able to pick up cane from floor without UE support with no LOB with supervision.      Knee/Hip Exercises: Stretches   Active Hamstring Stretch Both;2 reps;30 seconds    Active Hamstring Stretch Limitations supine w/strap      Knee/Hip Exercises: Standing   Rocker Board 1 minute   ant/level/post & right/level/left with round board w/single pivot point, BUE support with cues to minimize weight bearing   Rocker Board Limitations demo & verbal cues on technique prior and visual(mirror) & tactile cues during. working on hip strategy for controlling motion 4 directions with recovery to  midline.      Prosthetics   Current prosthetic wear tolerance (days/week)  daily    Current prosthetic wear tolerance (#hours/day)  most of awake hours except 30-95mn midday.    Current prosthetic weight-bearing tolerance (hours/day)  Pt tolerating weight bearing with cane up to 100' and no device 30' without limb pain.    Residual limb condition  both scabs have come off wounds. Distal wound with complete granulation closure. Tibial crest wound has granualtion forming with no signs of infection. Size of wound is smallern at 415mx 24m41m Tibial crest appears to have some bone spurs forming in area.  color & temperature normal. minimal to no hair growth. cylinderical shape.    Education Provided Proper wear schedule/adjustment                       PT Short Term Goals - 07/08/21 1656       PT SHORT TERM GOAL #1   Title Patient verbalizes adjusting ply socks for prosthetic fit.    Status Achieved    Target Date 07/09/21      PT SHORT TERM GOAL #2   Title Patient tolerates prosthesis >12 hrs total /day without increase in skin issues    Status Achieved    Target Date 07/09/21      PT SHORT TERM GOAL #3   Title Patient able to pick up item from floor without UE support with supervision.    Status Achieved    Target Date 07/09/21      PT SHORT TERM GOAL #4   Title Patient ambulates 200' with cane & prosthesis with supervision & no loss of balance.    Status Achieved    Target Date 07/09/21      PT SHORT TERM GOAL #5   Title Patient  negotiates ramps & curbs with cane & prosthesis with min guard.    Status Achieved    Target Date 07/09/21               PT Long Term Goals - 05/18/21 1023       PT LONG TERM GOAL #1   Title Patient demonstrates & verbalized understanding of prosthetic care to enable safe utilization of prosthesis.    Time 12    Period Weeks    Status On-going    Target Date 07/30/21      PT LONG TERM GOAL #2   Title Patient tolerates  prosthesis wear >90% of awake hours without skin or limb pain issues.    Time 12    Period Weeks    Status On-going    Target Date 07/30/21      PT LONG TERM GOAL #3   Title Berg Balance >/= 36/56 to indicate lower fall risk    Time 12    Period Weeks    Status On-going    Target Date 07/30/21      PT LONG TERM GOAL #4   Title Patient ambulates 300' with LRAD & prosthesis modified independent    Time 12    Period Weeks    Status On-going    Target Date 07/30/21      PT LONG TERM GOAL #5   Title Patient negotiates ramps, curbs & stairs with LRAD & prosthesis modified independent.    Time 12    Period Weeks    Status On-going    Target Date 07/30/21                   Plan - 07/08/21 1601     Clinical Impression Statement Patient met all STGs set for this period.  His wound is healing. His balance is improving also. PT worked on balance activities that facilitated hip strategy which improved with activities.  He may PT to be recertification at end of POC if wound is not healed.  He would benefit from improving his balance further also which would carryover into safer mobility.    Personal Factors and Comorbidities Comorbidity 3+;Age;Time since onset of injury/illness/exacerbation    Comorbidities Rt TTA, arthritis, right hip replacement, left knee arthroplasty, shoulder sg X2, gout, basal cell CA, LBP, back sg X3, IDDM, neuropathy, diverticulosis, heart murmur, HTN, MI,    Examination-Activity Limitations Lift;Locomotion Level;Squat;Stairs;Stand;Transfers    Examination-Participation Restrictions Community Activity;Yard Work    [employer] Evolving/Moderate complexity    Rehab Potential Good    PT Frequency 2x / week    PT Duration 12 weeks    PT Treatment/Interventions ADLs/Self Care Home Management;DME Instruction;Gait training;Stair training;Functional mobility training;Therapeutic activities;Therapeutic exercise;Balance training;Neuromuscular  re-education;Patient/family education;Prosthetic Training;Vestibular;Passive range of motion    PT Next Visit Plan standing balance,progress prosthetic gait with cane including ramps & curbs and short household without device,  check wounds, work towards [employer] and Agree with Plan of Care Patient             Patient will benefit from skilled therapeutic intervention in order to improve the following deficits and impairments:  Abnormal gait, Decreased activity tolerance, Decreased balance, Decreased endurance, Decreased knowledge of use of DME, Decreased mobility, Decreased range of motion, Decreased skin integrity, Decreased scar mobility, Decreased strength, Increased edema, Impaired flexibility, Postural dysfunction, Prosthetic Dependency  Visit Diagnosis: Other abnormalities of gait and mobility  Unsteadiness on feet  Abnormal posture  Muscle  weakness (generalized)  Stiffness of right knee, not elsewhere classified     Problem List Patient Active Problem List   Diagnosis Date Noted   Exudative age-related macular degeneration of right eye with active choroidal neovascularization ([place]) 06/01/2021   Intermediate stage nonexudative age-related macular degeneration of both eyes 06/01/2021   Type 2 diabetes mellitus with diabetic polyneuropathy, with long-term current use of insulin ([place]) 03/24/2021   Hypokalemia    Constipation 02/02/2021   Fecal impaction ([place]) 02/01/2021   Wound dehiscence 01/09/2021   Dehiscence of amputation stump ([place])    Status post percutaneous transluminal coronary angioplasty 01/06/2021   Type II diabetes mellitus, uncontrolled 01/06/2021   Acute pancreatitis 01/06/2021   Diabetic peripheral vascular disease ([place]) 01/06/2021   Encounter for screening for other disorder 01/06/2021   Enlarged prostate 01/06/2021   Foot ulcer, right ([place]) 01/06/2021   Gout 01/06/2021   Headache 01/06/2021   Neck pain 01/06/2021   Hypoglycemia 01/06/2021    Loss of appetite 01/06/2021   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   Peripheral neuropathy 01/06/2021   Sciatica 01/06/2021   Vitamin B12 deficiency 01/06/2021   Vitamin D deficiency 01/06/2021   Weakness 01/06/2021   Diabetes mellitus type 2 with neurological manifestations ([place]) 01/06/2021   Protein-calorie malnutrition, severe 12/26/2020   Acute blood loss anemia 12/26/2020   Prerenal azotemia 12/26/2020   Below-knee amputation of right lower extremity ([place]) 12/12/2020   Gangrene of right foot ([place])    Diabetic neuropathy ([place]) 11/17/2020   Hyperglycemia due to type 2 diabetes mellitus ([place]) 11/17/2020   Long term (current) use of insulin ([place]) 11/17/2020   Obesity 11/17/2020   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/2019   Hip dislocation, right ([place]) 04/13/2019   Other intervertebral disc degeneration, lumbar region 03/30/2019   CAD (coronary artery disease) 01/30/2019   Tobacco abuse 01/30/2019   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   Burn, foot, second degree, left, initial encounter 06/08/2017   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 03/16/2017   S/P total knee arthroplasty, left 10/26/2016   Hyperlipidemia 09/04/2014   Thrombocytopenia ([facility])    Precordial chest pain 04/05/2014   Coronary atherosclerosis of native coronary artery 10/01/2013   Other and unspecified hyperlipidemia 10/01/2013   Primary hypertension 10/01/2013   Diabetes mellitus ([place]) 10/01/2013   Esophageal reflux 10/01/2013   Hypertrophy of prostate without urinary obstruction and other lower urinary tract symptoms (LUTS) 10/01/2013    Jamey Reas, PT, DPT 07/08/2021, 5:00 PM  [facility] Physical Therapy [address] [place], [place], [address] Phone: [phone]   Fax:  [phone]  Name: Keith Espinoza MRN: [id number] Date of Birth: Jan 03, 1941

## 2021-07-09 ENCOUNTER — Ambulatory Visit (INDEPENDENT_AMBULATORY_CARE_PROVIDER_SITE_OTHER): Payer: Medicare Other | Admitting: Physical Therapy

## 2021-07-09 DIAGNOSIS — M25661 Stiffness of right knee, not elsewhere classified: Secondary | ICD-10-CM

## 2021-07-09 DIAGNOSIS — R2689 Other abnormalities of gait and mobility: Secondary | ICD-10-CM | POA: Diagnosis not present

## 2021-07-09 DIAGNOSIS — R2681 Unsteadiness on feet: Secondary | ICD-10-CM

## 2021-07-09 DIAGNOSIS — M6281 Muscle weakness (generalized): Secondary | ICD-10-CM

## 2021-07-09 NOTE — Therapy (Signed)
[facility] Physical Therapy [address] [place], [place], [address] Phone: [phone]   Fax:  [phone]  Physical Therapy Treatment  Patient Details  Name: Keith Espinoza MRN: [id number] Date of Birth: 1940-11-26 Referring Provider (PT): Meridee Score, MD   Encounter Date: 07/09/2021   PT End of Session - 07/09/21 1646     Visit Number 14    Number of Visits 25    Date for PT Re-Evaluation 07/30/21    Authorization Type Medicare A[employer] and [employer]    Progress Note Due on Visit 20    PT Start Time 1556    PT Stop Time 1640    PT Time Calculation (min) 44 min    Equipment Utilized During Treatment Gait belt    Activity Tolerance Patient tolerated treatment well    Behavior During Therapy [facility] for tasks assessed/performed             Past Medical History:  Diagnosis Date   Allergic rhinitis    Allergic rhinitis    Arthritis    Basal cell carcinoma 11/01/2019    bcc left chest treatment [place] cx3 17fu    Chronic leg pain    right   Chronic lower back pain    Coronary artery disease    a. Stenting to RCA 2004; staged DES to LAD and Cx 2004. DES to mRCA 2012. b. DES to mCx, PTCA to dCx 11/2011. c. Lateral wall MI 2013 s/p PTCA to distal Cx & DES to mid OM2 11/2011. d. Low risk nuc 04/2014, EF wnl.   COVID-19    Diabetes mellitus    Insulin dependent   Diabetic neuropathy ([place])    MILD   Diverticulosis    Dysrhythmia    [name] syndrome    Gout    right wrist; right foot; right elbow; have had it since 1970's   H/O hiatal hernia    Heart murmur    History of echocardiogram    aortic sclerosis per echo 12/09 EF 65%, otherwise normal   History of hemorrhoids    BLEEDING   History of kidney stones    h/o   Hypertension    Diagnosed 1995    Myocardial infarction [facility])    Pancreatic pseudocyst    a. s/p remote drainage 2006.   Thrombocytopenia ([place])    Seen on oldest labs in system from 2004   Vitamin B 12 deficiency    orally replaced     Past Surgical History:  Procedure Laterality Date   ABDOMINAL AORTOGRAM W/LOWER EXTREMITY Bilateral 08/08/2020   Procedure: ABDOMINAL AORTOGRAM W/LOWER EXTREMITY;  Surgeon: Angelia Mould, MD;  Location: [place] [facility];  Service: Cardiovascular;  Laterality: Bilateral;   AMPUTATION Right 12/12/2020   Procedure: RIGHT BELOW KNEE AMPUTATION;  Surgeon: [name], MD;  Location: [place];  Service: Orthopedics;  Laterality: Right;   BACK SURGERY     "total of 3 times" S/P fall    CARPAL TUNNEL RELEASE Bilateral    CHOLECYSTECTOMY  1990's   COLONOSCOPY     CORONARY ANGIOPLASTY  11/11/11   CORONARY ANGIOPLASTY WITH STENT PLACEMENT  09/30/2011   "1 then; makes a total of 4"   CORONARY ANGIOPLASTY WITH STENT PLACEMENT  11/11/11   "1; makes a total of 5"   INGUINAL HERNIA REPAIR  2003   right   JOINT REPLACEMENT Right 04/03/2002   hip replacment   KNEE ARTHROSCOPY  1990's   left   LEFT HEART CATHETERIZATION  WITH CORONARY ANGIOGRAM N/A 09/30/2011   Procedure: LEFT HEART CATHETERIZATION WITH CORONARY ANGIOGRAM;  Surgeon: Jettie Booze, MD;  Location: [facility] CATH LAB;  Service: Cardiovascular;  Laterality: N/A;  possible PCI   LEFT HEART CATHETERIZATION WITH CORONARY ANGIOGRAM N/A 11/15/2011   Procedure: LEFT HEART CATHETERIZATION WITH CORONARY ANGIOGRAM;  Surgeon: Jettie Booze, MD;  Location: [facility] CATH LAB;  Service: Cardiovascular;  Laterality: N/A;   PERCUTANEOUS CORONARY STENT INTERVENTION (PCI-S)  09/30/2011   Procedure: PERCUTANEOUS CORONARY STENT INTERVENTION (PCI-S);  Surgeon: Jettie Booze, MD;  Location: [facility] CATH LAB;  Service: Cardiovascular;;   PERCUTANEOUS CORONARY STENT INTERVENTION (PCI-S) N/A 11/11/2011   Procedure: PERCUTANEOUS CORONARY STENT INTERVENTION (PCI-S);  Surgeon: Jettie Booze, MD;  Location: [facility] CATH LAB;  Service: Cardiovascular;  Laterality: N/A;   PERIPHERAL VASCULAR BALLOON ANGIOPLASTY Right 08/08/2020   Procedure: PERIPHERAL VASCULAR  BALLOON ANGIOPLASTY;  Surgeon: Angelia Mould, MD;  Location: [place] [facility];  Service: Cardiovascular;  Laterality: Right;  Posterior tibial    SHOULDER SURGERY Right    X 2   STUMP REVISION Right 01/09/2021   Procedure: REVISION RIGHT BELOW KNEE AMPUTATION;  Surgeon: [name], MD;  Location: [place];  Service: Orthopedics;  Laterality: Right;   TONSILLECTOMY  ~ [place] Right 04/13/2019   Procedure: RIGHT TOTAL HIP REVISION-POSTERIOR  APPROACH LATERAL;  Surgeon: Marybelle Killings, MD;  Location: [place];  Service: Orthopedics;  Laterality: Right;   TOTAL KNEE ARTHROPLASTY Left 07/23/2016   Procedure: LEFT TOTAL KNEE ARTHROPLASTY;  Surgeon: Marybelle Killings, MD;  Location: [place];  Service: Orthopedics;  Laterality: Left;    There were no vitals filed for this visit.   Subjective Assessment - 07/09/21 1643     Subjective relays he has increased pressure around his knee cap today causing pain when he stands    Patient is accompained by: Family member   granddaughter   Pertinent History Rt TTA, arthritis, right hip replacement, left knee arthroplasty, shoulder sg X2, gout, basal cell CA, LBP, back sg X3, IDDM, neuropathy, diverticulosis, heart murmur, HTN, MI,    Limitations Standing;House hold activities;Walking    Patient Stated Goals walk in community & on farm, get on tractor, yard work, drive    Pain Onset More than a month ago                               [facility] Adult PT Treatment/Exercise - 07/09/21 0001       Transfers   Transfers Sit to Stand;Stand to Sit    Sit to Stand 5: Supervision;With upper extremity assist;With armrests;From chair/3-in-1;Other (comment)   cane touch to stabilize   Stand to Sit 5: Supervision;With upper extremity assist;With armrests;To chair/3-in-1;Other (comment)   cane     Ambulation/Gait   Ambulation/Gait Yes    Ambulation/Gait Assistance 5: Supervision   cane   Ambulation Distance (Feet) 250 Feet     Assistive device Straight cane;Prosthesis   cane stand alone tip.   Ramp 4: Min assist   cane stand alone tip & TTA prosthesis   Curb 4: Min assist   min guard cane stand alone tip & TTA prosthesis     High Level Balance   High Level Balance Comments in //bars without UE support for fwd walking, retrowalking, and sidestepping 3 round trips ea; verbal, demo & tactile cues on technique.      Knee/Hip Exercises:  Standing   Other Standing Knee Exercises hip 4 way with red X20 ea bilat with UE support      Knee/Hip Exercises: Seated   Sit to Sand 10 reps;without UE support   from 24 inch bar stool     Prosthetics   Prosthetic Care Comments  it was noted that he needed additional ply sock and reviewed how to tell when to add and subtract ply socks                       PT Short Term Goals - 07/08/21 1656       PT SHORT TERM GOAL #1   Title Patient verbalizes adjusting ply socks for prosthetic fit.    Status Achieved    Target Date 07/09/21      PT SHORT TERM GOAL #2   Title Patient tolerates prosthesis >12 hrs total /day without increase in skin issues    Status Achieved    Target Date 07/09/21      PT SHORT TERM GOAL #3   Title Patient able to pick up item from floor without UE support with supervision.    Status Achieved    Target Date 07/09/21      PT SHORT TERM GOAL #4   Title Patient ambulates 200' with cane & prosthesis with supervision & no loss of balance.    Status Achieved    Target Date 07/09/21      PT SHORT TERM GOAL #5   Title Patient negotiates ramps & curbs with cane & prosthesis with min guard.    Status Achieved    Target Date 07/09/21               PT Long Term Goals - 05/18/21 1023       PT LONG TERM GOAL #1   Title Patient demonstrates & verbalized understanding of prosthetic care to enable safe utilization of prosthesis.    Time 12    Period Weeks    Status On-going    Target Date 07/30/21      PT LONG TERM GOAL #2   Title  Patient tolerates prosthesis wear >90% of awake hours without skin or limb pain issues.    Time 12    Period Weeks    Status On-going    Target Date 07/30/21      PT LONG TERM GOAL #3   Title [place] Balance >/= 36/56 to indicate lower fall risk    Time 12    Period Weeks    Status On-going    Target Date 07/30/21      PT LONG TERM GOAL #4   Title Patient ambulates 300' with LRAD & prosthesis modified independent    Time 12    Period Weeks    Status On-going    Target Date 07/30/21      PT LONG TERM GOAL #5   Title Patient negotiates ramps, curbs & stairs with LRAD & prosthesis modified independent.    Time 12    Period Weeks    Status On-going    Target Date 07/30/21                   Plan - 07/09/21 1647     Clinical Impression Statement he was noted not not have enough ply socks causing increased pressure around his knee cap, He did not have socks with him so I provided him with one today that was 3 ply to add to  the 5 ply he was wearing. This improved overall fit and reduced pressure to his patella. We then continued to work on balance and gait in bars without UE support and prosthetic gait in clinic with SPC. He remains motivated and shows great effort with PT.    Personal Factors and Comorbidities Comorbidity 3+;Age;Time since onset of injury/illness/exacerbation    Comorbidities Rt TTA, arthritis, right hip replacement, left knee arthroplasty, shoulder sg X2, gout, basal cell CA, LBP, back sg X3, IDDM, neuropathy, diverticulosis, heart murmur, HTN, MI,    Examination-Activity Limitations Lift;Locomotion Level;Squat;Stairs;Stand;Transfers    Examination-Participation Restrictions Community Activity;Yard Work    [employer] Evolving/Moderate complexity    Rehab Potential Good    PT Frequency 2x / week    PT Duration 12 weeks    PT Treatment/Interventions ADLs/Self Care Home Management;DME Instruction;Gait training;Stair training;Functional  mobility training;Therapeutic activities;Therapeutic exercise;Balance training;Neuromuscular re-education;Patient/family education;Prosthetic Training;Vestibular;Passive range of motion    PT Next Visit Plan standing balance,progress prosthetic gait with cane including ramps & curbs and short household without device,  check wounds, work towards [employer] and Agree with Plan of Care Patient             Patient will benefit from skilled therapeutic intervention in order to improve the following deficits and impairments:  Abnormal gait, Decreased activity tolerance, Decreased balance, Decreased endurance, Decreased knowledge of use of DME, Decreased mobility, Decreased range of motion, Decreased skin integrity, Decreased scar mobility, Decreased strength, Increased edema, Impaired flexibility, Postural dysfunction, Prosthetic Dependency  Visit Diagnosis: Other abnormalities of gait and mobility  Unsteadiness on feet  Muscle weakness (generalized)  Stiffness of right knee, not elsewhere classified     Problem List Patient Active Problem List   Diagnosis Date Noted   Exudative age-related macular degeneration of right eye with active choroidal neovascularization ([place]) 06/01/2021   Intermediate stage nonexudative age-related macular degeneration of both eyes 06/01/2021   Type 2 diabetes mellitus with diabetic polyneuropathy, with long-term current use of insulin (HCC) 03/24/2021   Hypokalemia    Constipation 02/02/2021   Fecal impaction ([place]) 02/01/2021   Wound dehiscence 01/09/2021   Dehiscence of amputation stump (HCC)    Status post percutaneous transluminal coronary angioplasty 01/06/2021   Type II diabetes mellitus, uncontrolled 01/06/2021   Acute pancreatitis 01/06/2021   Diabetic peripheral vascular disease ([place]) 01/06/2021   Encounter for screening for other disorder 01/06/2021   Enlarged prostate 01/06/2021   Foot ulcer, right ([place]) 01/06/2021   Gout 01/06/2021    Headache 01/06/2021   Neck pain 01/06/2021   Hypoglycemia 01/06/2021   Loss of appetite 01/06/2021   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   Peripheral neuropathy 01/06/2021   Sciatica 01/06/2021   Vitamin B12 deficiency 01/06/2021   Vitamin D deficiency 01/06/2021   Weakness 01/06/2021   Diabetes mellitus type 2 with neurological manifestations ([place]) 01/06/2021   Protein-calorie malnutrition, severe 12/26/2020   Acute blood loss anemia 12/26/2020   Prerenal azotemia 12/26/2020   Below-knee amputation of right lower extremity ([place]) 12/12/2020   Gangrene of right foot ([place])    Diabetic neuropathy ([place]) 11/17/2020   Hyperglycemia due to type 2 diabetes mellitus ([place]) 11/17/2020   Long term (current) use of insulin ([place]) 11/17/2020   Obesity 11/17/2020   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/2019   Hip dislocation, right ([place]) 04/13/2019   Other intervertebral disc degeneration, lumbar region 03/30/2019   CAD (coronary artery disease) 01/30/2019   Tobacco abuse 01/30/2019   Recurrent dislocation of right  hip 04/25/2018   Burn, foot, second degree, left, initial encounter 06/08/2017   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 03/16/2017   S/P total knee arthroplasty, left 10/26/2016   Hyperlipidemia 09/04/2014   Thrombocytopenia (HCC)    Precordial chest pain 04/05/2014   Coronary atherosclerosis of native coronary artery 10/01/2013   Other and unspecified hyperlipidemia 10/01/2013   Primary hypertension 10/01/2013   Diabetes mellitus ([place]) 10/01/2013   Esophageal reflux 10/01/2013   Hypertrophy of prostate without urinary obstruction and other lower urinary tract symptoms (LUTS) 10/01/2013    Debbe Odea, PT,DPT 07/09/2021, 4:50 PM  [facility] Physical Therapy [address] [place], [place], [address] Phone: [phone]   Fax:  [phone]  Name: Keith Espinoza MRN: [id number] Date of Birth: Aug 14, 1941

## 2021-07-13 ENCOUNTER — Ambulatory Visit (INDEPENDENT_AMBULATORY_CARE_PROVIDER_SITE_OTHER): Payer: Medicare Other | Admitting: Physical Therapy

## 2021-07-13 ENCOUNTER — Encounter: Payer: Self-pay | Admitting: Physical Therapy

## 2021-07-13 ENCOUNTER — Other Ambulatory Visit: Payer: Self-pay

## 2021-07-13 DIAGNOSIS — R2689 Other abnormalities of gait and mobility: Secondary | ICD-10-CM

## 2021-07-13 DIAGNOSIS — M25661 Stiffness of right knee, not elsewhere classified: Secondary | ICD-10-CM | POA: Diagnosis not present

## 2021-07-13 DIAGNOSIS — M6281 Muscle weakness (generalized): Secondary | ICD-10-CM | POA: Diagnosis not present

## 2021-07-13 DIAGNOSIS — R2681 Unsteadiness on feet: Secondary | ICD-10-CM | POA: Diagnosis not present

## 2021-07-13 DIAGNOSIS — R293 Abnormal posture: Secondary | ICD-10-CM | POA: Diagnosis not present

## 2021-07-13 NOTE — Therapy (Signed)
[facility] Physical Therapy [address] [place], [place], [address] Phone: [phone]   Fax:  [phone]  Physical Therapy Treatment  Patient Details  Name: Keith Espinoza MRN: [id number] Date of Birth: Jun 13, 1941 Referring Provider (PT): Meridee Score, MD   Encounter Date: 07/13/2021   PT End of Session - 07/13/21 1603     Visit Number 15    Number of Visits 25    Date for PT Re-Evaluation 07/30/21    Authorization Type Medicare A&[employer] and [employer]    Progress Note Due on Visit 20    PT Start Time 1600    PT Stop Time 1645    PT Time Calculation (min) 45 min    Equipment Utilized During Treatment Gait belt    Activity Tolerance Patient tolerated treatment well    Behavior During Therapy [facility] for tasks assessed/performed             Past Medical History:  Diagnosis Date   Allergic rhinitis    Allergic rhinitis    Arthritis    Basal cell carcinoma 11/01/2019    bcc left chest treatment TX cx3 17fu    Chronic leg pain    right   Chronic lower back pain    Coronary artery disease    a. Stenting to RCA 2004; staged DES to LAD and Cx 2004. DES to mRCA 2012. b. DES to mCx, PTCA to dCx 11/2011. c. Lateral wall MI 2013 s/p PTCA to distal Cx & DES to mid OM2 11/2011. d. Low risk nuc 04/2014, EF wnl.   COVID-19    Diabetes mellitus    Insulin dependent   Diabetic neuropathy ([place])    MILD   Diverticulosis    Dysrhythmia    Keith Espinoza syndrome    Gout    right wrist; right foot; right elbow; have had it since 1970's   H/O hiatal hernia    Heart murmur    History of echocardiogram    aortic sclerosis per echo 12/09 EF 65%, otherwise normal   History of hemorrhoids    BLEEDING   History of kidney stones    h/o   Hypertension    Diagnosed 1995    Myocardial infarction [facility])    Pancreatic pseudocyst    a. s/p remote drainage 2006.   Thrombocytopenia ([place])    Seen on oldest labs in system from 2004   Vitamin B 12 deficiency    orally replaced     Past Surgical History:  Procedure Laterality Date   ABDOMINAL AORTOGRAM W/LOWER EXTREMITY Bilateral 08/08/2020   Procedure: ABDOMINAL AORTOGRAM W/LOWER EXTREMITY;  Surgeon: Angelia Mould, MD;  Location: [place] [facility];  Service: Cardiovascular;  Laterality: Bilateral;   AMPUTATION Right 12/12/2020   Procedure: RIGHT BELOW KNEE AMPUTATION;  Surgeon: [name], MD;  Location: [place];  Service: Orthopedics;  Laterality: Right;   BACK SURGERY     "total of 3 times" S/P fall    CARPAL TUNNEL RELEASE Bilateral    CHOLECYSTECTOMY  1990's   COLONOSCOPY     CORONARY ANGIOPLASTY  11/11/11   CORONARY ANGIOPLASTY WITH STENT PLACEMENT  09/30/2011   "1 then; makes a total of 4"   CORONARY ANGIOPLASTY WITH STENT PLACEMENT  11/11/11   "1; makes a total of 5"   INGUINAL HERNIA REPAIR  2003   right   JOINT REPLACEMENT Right 04/03/2002   hip replacment   KNEE ARTHROSCOPY  1990's   left   LEFT HEART CATHETERIZATION  WITH CORONARY ANGIOGRAM N/A 09/30/2011   Procedure: LEFT HEART CATHETERIZATION WITH CORONARY ANGIOGRAM;  Surgeon: Jettie Booze, MD;  Location: [facility] CATH LAB;  Service: Cardiovascular;  Laterality: N/A;  possible PCI   LEFT HEART CATHETERIZATION WITH CORONARY ANGIOGRAM N/A 11/15/2011   Procedure: LEFT HEART CATHETERIZATION WITH CORONARY ANGIOGRAM;  Surgeon: Jettie Booze, MD;  Location: [facility] CATH LAB;  Service: Cardiovascular;  Laterality: N/A;   PERCUTANEOUS CORONARY STENT INTERVENTION (PCI-S)  09/30/2011   Procedure: PERCUTANEOUS CORONARY STENT INTERVENTION (PCI-S);  Surgeon: Jettie Booze, MD;  Location: [facility] CATH LAB;  Service: Cardiovascular;;   PERCUTANEOUS CORONARY STENT INTERVENTION (PCI-S) N/A 11/11/2011   Procedure: PERCUTANEOUS CORONARY STENT INTERVENTION (PCI-S);  Surgeon: Jettie Booze, MD;  Location: [facility] CATH LAB;  Service: Cardiovascular;  Laterality: N/A;   PERIPHERAL VASCULAR BALLOON ANGIOPLASTY Right 08/08/2020   Procedure: PERIPHERAL VASCULAR  BALLOON ANGIOPLASTY;  Surgeon: Angelia Mould, MD;  Location: [place] [facility];  Service: Cardiovascular;  Laterality: Right;  Posterior tibial    SHOULDER SURGERY Right    X 2   STUMP REVISION Right 01/09/2021   Procedure: REVISION RIGHT BELOW KNEE AMPUTATION;  Surgeon: [name], MD;  Location: [place];  Service: Orthopedics;  Laterality: Right;   TONSILLECTOMY  ~ [place] Right 04/13/2019   Procedure: RIGHT TOTAL HIP REVISION-POSTERIOR  APPROACH LATERAL;  Surgeon: Marybelle Killings, MD;  Location: [place];  Service: Orthopedics;  Laterality: Right;   TOTAL KNEE ARTHROPLASTY Left 07/23/2016   Procedure: LEFT TOTAL KNEE ARTHROPLASTY;  Surgeon: Marybelle Killings, MD;  Location: [place];  Service: Orthopedics;  Laterality: Left;    There were no vitals filed for this visit.   Subjective Assessment - 07/13/21 1600     Subjective He is wearing prosthesis most of awake hours. His wound looks better.    Patient is accompained by: Family member   granddaughter   Pertinent History Rt TTA, arthritis, right hip replacement, left knee arthroplasty, shoulder sg X2, gout, basal cell CA, LBP, back sg X3, IDDM, neuropathy, diverticulosis, heart murmur, HTN, MI,    Limitations Standing;House hold activities;Walking    Patient Stated Goals walk in community & on farm, get on tractor, yard work, drive    Currently in Pain? No/denies    Pain Onset More than a month ago                               [facility] Adult PT Treatment/Exercise - 07/13/21 1600       Transfers   Transfers Sit to Stand;Stand to Sit    Sit to Stand 5: Supervision;With upper extremity assist;With armrests;From chair/3-in-1;Other (comment)   cane touch to stabilize   Stand to Sit 5: Supervision;With upper extremity assist;With armrests;To chair/3-in-1;Other (comment)   cane     Ambulation/Gait   Ambulation/Gait Yes    Ambulation/Gait Assistance 5: Supervision   cane   Ambulation/Gait Assistance  Details worked on scanning when ambulating with cane and maintaining path.  level surface household distances without device.    Ambulation Distance (Feet) 350 Feet   350' cane & 100' X 2 without AD   Assistive device Straight cane;Prosthesis;None   cane stand alone tip.   Ramp 4: Min assist   min guard, cane stand alone tip & TTA prosthesis   Curb 5: Supervision   cane stand alone tip & TTA prosthesis     High Level Balance  High Level Balance Activities Side stepping;Turns;Negotiating over obstacles;Backward walking   90* turns, bar stool sit>stand turn 360* tracking eyes ahead stand>sit 2 reps ea directions,   High Level Balance Comments in //bars without UE support; verbal, demo & tactile cues on technique.      Knee/Hip Exercises: Stretches   Active Hamstring Stretch --    Active Hamstring Stretch Limitations --      Knee/Hip Exercises: Standing   Rocker Board 1 minute   ant/level/post & right/level/left with round board w/single pivot point, BUE support with cues to minimize weight bearing   Rocker Board Limitations demo & verbal cues on technique prior and visual(mirror) & tactile cues during. working on hip strategy for controlling motion 4 directions with recovery to midline.      Prosthetics   Prosthetic Care Comments  prosthesis is 1/2" too short as his LLE knee ext ROM has improved.  Dtr set up appt with prosthetist 10/11 at 9:00am.    Current prosthetic wear tolerance (days/week)  daily    Current prosthetic wear tolerance (#hours/day)  most of awake hours except 30-110min midday.    Current prosthetic weight-bearing tolerance (hours/day)  Pt tolerating weight bearing for 8-10 minutes without c/o pain or limb discomfort.    Residual limb condition  both scabs have come off wounds. Distal wound with complete granulation closure. Tibial crest wound has granualtion forming with no signs of infection. Size of wound is smallern at 30mm x 2mm. small amount of bloody drainage on  prosthetic socks when checked limb at beginning of PT session and no drainage at end of PT session.  Tibial crest appears to have some bone spurs forming in area.  color & temperature normal. minimal to no hair growth. cylinderical shape.    Education Provided Other (comment)   see prosthetic care comments   Person(s) Educated Patient;Child(ren)    Education Method Explanation;Verbal cues    Education Method Verbalized understanding    Donning Prosthesis Modified independent (device/increased time)                 Balance Exercises - 07/13/21 1600       Balance Exercises: Standing   Standing Eyes Opened Wide (BOA);Solid surface;Foam/compliant surface;Head turns;5 reps   4 directions,  solid min guard & foam minA   Standing Eyes Opened Limitations tactile, visual & verbal cues on equal weight bearing & balance reactions.    Standing Eyes Closed Wide (BOA);Head turns;Solid surface;5 reps   4 directions, minA   Standing Eyes Closed Limitations tactile, visual (reset bw directions) & verbal cues on equal weight bearing & balance reactions.    Stepping Strategy Anterior;Posterior   3 reps ea LE ea direction   Stepping Strategy Limitations anticipatory stepping off foam beam stabilizing without touching bars to stabilize.  He was able to take slow gaurded step ant & post with LLE without touching.  RLE ant able to catch balance 3rd rep without touching but posteriorly required touch or assist ea rep.                  PT Short Term Goals - 07/08/21 1656       PT SHORT TERM GOAL #1   Title Patient verbalizes adjusting ply socks for prosthetic fit.    Status Achieved    Target Date 07/09/21      PT SHORT TERM GOAL #2   Title Patient tolerates prosthesis >12 hrs total /day without increase in skin issues    Status  Achieved    Target Date 07/09/21      PT SHORT TERM GOAL #3   Title Patient able to pick up item from floor without UE support with supervision.    Status Achieved     Target Date 07/09/21      PT SHORT TERM GOAL #4   Title Patient ambulates 200' with cane & prosthesis with supervision & no loss of balance.    Status Achieved    Target Date 07/09/21      PT SHORT TERM GOAL #5   Title Patient negotiates ramps & curbs with cane & prosthesis with min guard.    Status Achieved    Target Date 07/09/21               PT Long Term Goals - 05/18/21 1023       PT LONG TERM GOAL #1   Title Patient demonstrates & verbalized understanding of prosthetic care to enable safe utilization of prosthesis.    Time 12    Period Weeks    Status On-going    Target Date 07/30/21      PT LONG TERM GOAL #2   Title Patient tolerates prosthesis wear >90% of awake hours without skin or limb pain issues.    Time 12    Period Weeks    Status On-going    Target Date 07/30/21      PT LONG TERM GOAL #3   Title Berg Balance >/= 36/56 to indicate lower fall risk    Time 12    Period Weeks    Status On-going    Target Date 07/30/21      PT LONG TERM GOAL #4   Title Patient ambulates 300' with LRAD & prosthesis modified independent    Time 12    Period Weeks    Status On-going    Target Date 07/30/21      PT LONG TERM GOAL #5   Title Patient negotiates ramps, curbs & stairs with LRAD & prosthesis modified independent.    Time 12    Period Weeks    Status On-going    Target Date 07/30/21                   Plan - 07/13/21 1603     Clinical Impression Statement Patient's prosthesis appears 1/2" too short now that he has better ROM LLE.  His wound continues to heal with smaller measurements.  PT worked on balance facilitating ankle/residual limb, hip & step strategies which improved with repetition & instruction.    Personal Factors and Comorbidities Comorbidity 3+;Age;Time since onset of injury/illness/exacerbation    Comorbidities Rt TTA, arthritis, right hip replacement, left knee arthroplasty, shoulder sg X2, gout, basal cell CA, LBP, back sg  X3, IDDM, neuropathy, diverticulosis, heart murmur, HTN, MI,    Examination-Activity Limitations Lift;Locomotion Level;Squat;Stairs;Stand;Transfers    Examination-Participation Restrictions Community Activity;Yard Work    [employer] Evolving/Moderate complexity    Rehab Potential Good    PT Frequency 2x / week    PT Duration 12 weeks    PT Treatment/Interventions ADLs/Self Care Home Management;DME Instruction;Gait training;Stair training;Functional mobility training;Therapeutic activities;Therapeutic exercise;Balance training;Neuromuscular re-education;Patient/family education;Prosthetic Training;Vestibular;Passive range of motion    PT Next Visit Plan standing balance, progress prosthetic gait with cane including ramps & curbs and household without device including carrying plate/cup,  check wounds, work towards [employer] and Agree with Plan of Care Patient  Patient will benefit from skilled therapeutic intervention in order to improve the following deficits and impairments:  Abnormal gait, Decreased activity tolerance, Decreased balance, Decreased endurance, Decreased knowledge of use of DME, Decreased mobility, Decreased range of motion, Decreased skin integrity, Decreased scar mobility, Decreased strength, Increased edema, Impaired flexibility, Postural dysfunction, Prosthetic Dependency  Visit Diagnosis: Other abnormalities of gait and mobility  Unsteadiness on feet  Muscle weakness (generalized)  Stiffness of right knee, not elsewhere classified  Abnormal posture     Problem List Patient Active Problem List   Diagnosis Date Noted   Exudative age-related macular degeneration of right eye with active choroidal neovascularization ([place]) 06/01/2021   Intermediate stage nonexudative age-related macular degeneration of both eyes 06/01/2021   Type 2 diabetes mellitus with diabetic polyneuropathy, with long-term current use of insulin  ([place]) 03/24/2021   Hypokalemia    Constipation 02/02/2021   Fecal impaction ([place]) 02/01/2021   Wound dehiscence 01/09/2021   Dehiscence of amputation stump ([place])    Status post percutaneous transluminal coronary angioplasty 01/06/2021   Type II diabetes mellitus, uncontrolled 01/06/2021   Acute pancreatitis 01/06/2021   Diabetic peripheral vascular disease ([place]) 01/06/2021   Encounter for screening for other disorder 01/06/2021   Enlarged prostate 01/06/2021   Foot ulcer, right ([place]) 01/06/2021   Gout 01/06/2021   Headache 01/06/2021   Neck pain 01/06/2021   Hypoglycemia 01/06/2021   Loss of appetite 01/06/2021   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   Peripheral neuropathy 01/06/2021   Sciatica 01/06/2021   Vitamin B12 deficiency 01/06/2021   Vitamin D deficiency 01/06/2021   Weakness 01/06/2021   Diabetes mellitus type 2 with neurological manifestations ([place]) 01/06/2021   Protein-calorie malnutrition, severe 12/26/2020   Acute blood loss anemia 12/26/2020   Prerenal azotemia 12/26/2020   Below-knee amputation of right lower extremity ([place]) 12/12/2020   Gangrene of right foot ([place])    Diabetic neuropathy ([place]) 11/17/2020   Hyperglycemia due to type 2 diabetes mellitus ([place]) 11/17/2020   Long term (current) use of insulin ([place]) 11/17/2020   Obesity 11/17/2020   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/2019   Hip dislocation, right ([place]) 04/13/2019   Other intervertebral disc degeneration, lumbar region 03/30/2019   CAD (coronary artery disease) 01/30/2019   Tobacco abuse 01/30/2019   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   Burn, foot, second degree, left, initial encounter 06/08/2017   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 03/16/2017   S/P total knee arthroplasty, left 10/26/2016   Hyperlipidemia 09/04/2014   Thrombocytopenia ([facility])    Precordial chest pain 04/05/2014   Coronary atherosclerosis of native coronary artery 10/01/2013   Other and unspecified  hyperlipidemia 10/01/2013   Primary hypertension 10/01/2013   Diabetes mellitus ([place]) 10/01/2013   Esophageal reflux 10/01/2013   Hypertrophy of prostate without urinary obstruction and other lower urinary tract symptoms (LUTS) 10/01/2013    Jamey Reas, PT, DPT 07/13/2021, 4:55 PM  [facility] Physical Therapy [address] [place], [place], [address] Phone: [phone]   Fax:  [phone]  Name: BRENAN MODESTO MRN: [id number] Date of Birth: September 30, 1941

## 2021-07-14 ENCOUNTER — Encounter (INDEPENDENT_AMBULATORY_CARE_PROVIDER_SITE_OTHER): Payer: Medicare Other | Admitting: Ophthalmology

## 2021-07-14 ENCOUNTER — Ambulatory Visit (INDEPENDENT_AMBULATORY_CARE_PROVIDER_SITE_OTHER): Payer: Medicare Other | Admitting: Physical Therapy

## 2021-07-14 ENCOUNTER — Encounter: Payer: Self-pay | Admitting: Physical Therapy

## 2021-07-14 DIAGNOSIS — R2689 Other abnormalities of gait and mobility: Secondary | ICD-10-CM | POA: Diagnosis not present

## 2021-07-14 DIAGNOSIS — M25661 Stiffness of right knee, not elsewhere classified: Secondary | ICD-10-CM

## 2021-07-14 DIAGNOSIS — R2681 Unsteadiness on feet: Secondary | ICD-10-CM

## 2021-07-14 DIAGNOSIS — M6281 Muscle weakness (generalized): Secondary | ICD-10-CM | POA: Diagnosis not present

## 2021-07-14 DIAGNOSIS — R293 Abnormal posture: Secondary | ICD-10-CM | POA: Diagnosis not present

## 2021-07-14 NOTE — Therapy (Signed)
[facility] Physical Therapy [address] [place], [place], [address] Phone: [phone]   Fax:  [phone]  Physical Therapy Treatment  Patient Details  Name: RASHAD AULD MRN: [id number] Date of Birth: Sep 25, 1941 Referring Provider (PT): Meridee Score, MD   Encounter Date: 07/14/2021   PT End of Session - 07/14/21 1144     Visit Number 16    Number of Visits 25    Date for PT Re-Evaluation 07/30/21    Authorization Type Medicare A[employer] and [employer]    Progress Note Due on Visit 20    PT Start Time 1144    PT Stop Time 1229    PT Time Calculation (min) 45 min    Equipment Utilized During Treatment Gait belt    Activity Tolerance Patient tolerated treatment well    Behavior During Therapy [facility] for tasks assessed/performed             Past Medical History:  Diagnosis Date   Allergic rhinitis    Allergic rhinitis    Arthritis    Basal cell carcinoma 11/01/2019    bcc left chest treatment [place] cx3 63fu    Chronic leg pain    right   Chronic lower back pain    Coronary artery disease    a. Stenting to RCA 2004; staged DES to LAD and Cx 2004. DES to mRCA 2012. b. DES to mCx, PTCA to dCx 11/2011. c. Lateral wall MI 2013 s/p PTCA to distal Cx & DES to mid OM2 11/2011. d. Low risk nuc 04/2014, EF wnl.   COVID-19    Diabetes mellitus    Insulin dependent   Diabetic neuropathy ([place])    MILD   Diverticulosis    Dysrhythmia    [name] syndrome    Gout    right wrist; right foot; right elbow; have had it since 1970's   H/O hiatal hernia    Heart murmur    History of echocardiogram    aortic sclerosis per echo 12/09 EF 65%, otherwise normal   History of hemorrhoids    BLEEDING   History of kidney stones    h/o   Hypertension    Diagnosed 1995    Myocardial infarction [facility])    Pancreatic pseudocyst    a. s/p remote drainage 2006.   Thrombocytopenia ([place])    Seen on oldest labs in system from 2004   Vitamin B 12 deficiency    orally replaced     Past Surgical History:  Procedure Laterality Date   ABDOMINAL AORTOGRAM W/LOWER EXTREMITY Bilateral 08/08/2020   Procedure: ABDOMINAL AORTOGRAM W/LOWER EXTREMITY;  Surgeon: Angelia Mould, MD;  Location: [place] CV LAB;  Service: Cardiovascular;  Laterality: Bilateral;   AMPUTATION Right 12/12/2020   Procedure: RIGHT BELOW KNEE AMPUTATION;  Surgeon: [name], MD;  Location: [place];  Service: Orthopedics;  Laterality: Right;   BACK SURGERY     "total of 3 times" S/P fall    CARPAL TUNNEL RELEASE Bilateral    CHOLECYSTECTOMY  1990's   COLONOSCOPY     CORONARY ANGIOPLASTY  11/11/11   CORONARY ANGIOPLASTY WITH STENT PLACEMENT  09/30/2011   "1 then; makes a total of 4"   CORONARY ANGIOPLASTY WITH STENT PLACEMENT  11/11/11   "1; makes a total of 5"   INGUINAL HERNIA REPAIR  2003   right   JOINT REPLACEMENT Right 04/03/2002   hip replacment   KNEE ARTHROSCOPY  1990's   left   LEFT HEART CATHETERIZATION  WITH CORONARY ANGIOGRAM N/A 09/30/2011   Procedure: LEFT HEART CATHETERIZATION WITH CORONARY ANGIOGRAM;  Surgeon: Jettie Booze, MD;  Location: [facility] CATH LAB;  Service: Cardiovascular;  Laterality: N/A;  possible PCI   LEFT HEART CATHETERIZATION WITH CORONARY ANGIOGRAM N/A 11/15/2011   Procedure: LEFT HEART CATHETERIZATION WITH CORONARY ANGIOGRAM;  Surgeon: Jettie Booze, MD;  Location: [facility] CATH LAB;  Service: Cardiovascular;  Laterality: N/A;   PERCUTANEOUS CORONARY STENT INTERVENTION (PCI-S)  09/30/2011   Procedure: PERCUTANEOUS CORONARY STENT INTERVENTION (PCI-S);  Surgeon: Jettie Booze, MD;  Location: [facility] CATH LAB;  Service: Cardiovascular;;   PERCUTANEOUS CORONARY STENT INTERVENTION (PCI-S) N/A 11/11/2011   Procedure: PERCUTANEOUS CORONARY STENT INTERVENTION (PCI-S);  Surgeon: Jettie Booze, MD;  Location: [facility] CATH LAB;  Service: Cardiovascular;  Laterality: N/A;   PERIPHERAL VASCULAR BALLOON ANGIOPLASTY Right 08/08/2020   Procedure: PERIPHERAL VASCULAR  BALLOON ANGIOPLASTY;  Surgeon: Angelia Mould, MD;  Location: [place] CV LAB;  Service: Cardiovascular;  Laterality: Right;  Posterior tibial    SHOULDER SURGERY Right    X 2   STUMP REVISION Right 01/09/2021   Procedure: REVISION RIGHT BELOW KNEE AMPUTATION;  Surgeon: [name], MD;  Location: [place];  Service: Orthopedics;  Laterality: Right;   TONSILLECTOMY  ~ [place] Right 04/13/2019   Procedure: RIGHT TOTAL HIP REVISION-POSTERIOR  APPROACH LATERAL;  Surgeon: Marybelle Killings, MD;  Location: [place];  Service: Orthopedics;  Laterality: Right;   TOTAL KNEE ARTHROPLASTY Left 07/23/2016   Procedure: LEFT TOTAL KNEE ARTHROPLASTY;  Surgeon: Marybelle Killings, MD;  Location: [place];  Service: Orthopedics;  Laterality: Left;    There were no vitals filed for this visit.   Subjective Assessment - 07/14/21 1145     Subjective He saw prosthetist this morning who lengthened prosthesis 1/4".  He did not notice any more bleeding from wound.    Patient is accompained by: Family member   granddaughter   Pertinent History Rt TTA, arthritis, right hip replacement, left knee arthroplasty, shoulder sg X2, gout, basal cell CA, LBP, back sg X3, IDDM, neuropathy, diverticulosis, heart murmur, HTN, MI,    Limitations Standing;House hold activities;Walking    Patient Stated Goals walk in community & on farm, get on tractor, yard work, drive    Currently in Pain? No/denies    Pain Onset More than a month ago                               [facility] Adult PT Treatment/Exercise - 07/14/21 1145       Transfers   Transfers Sit to Stand;Stand to Sit    Sit to Stand 5: Supervision;With upper extremity assist;From chair/3-in-1;Other (comment)   no touch to stabilize   Stand to Sit 5: Supervision;With upper extremity assist;To chair/3-in-1;Other (comment)      Ambulation/Gait   Ambulation/Gait Yes    Ambulation/Gait Assistance 5: Supervision   cane   Ambulation/Gait  Assistance Details discussed short distances like household no device, limited community with cane with family supervision & longer community with RW. Pt verbalized understanding.  worked on scanning maintaining path with cane & no device.    Ambulation Distance (Feet) 250 Feet   250' cane & 100' X 2 without AD   Assistive device Straight cane;Prosthesis;None   cane stand alone tip.   Stairs Yes    Stairs Assistance 4: Min assist    Stairs Assistance Details (indicate  cue type and reason) cues on prosthetic foot position with alt pattern    Stair Management Technique One rail Right;Alternating pattern;Forwards    Number of Stairs 11   6   Ramp 4: Min assist   min guard, cane stand alone tip & TTA prosthesis   Curb 5: Supervision   cane stand alone tip & TTA prosthesis     High Level Balance   High Level Balance Activities Braiding    High Level Balance Comments in //bars with UE support; verbal, demo & tactile cues on technique.      Knee/Hip Exercises: Standing   Rocker Board 1 minute   ant/level/post & right/level/left with round board w/single pivot point, BUE support with cues to minimize weight bearing   Rocker Board Limitations demo & verbal cues on technique prior and visual(mirror) & tactile cues during. working on hip strategy for controlling motion 4 directions with recovery to midline.      Prosthetics   Prosthetic Care Comments  --    Current prosthetic wear tolerance (days/week)  daily    Current prosthetic wear tolerance (#hours/day)  most of awake hours except 30-67min midday.    Current prosthetic weight-bearing tolerance (hours/day)  Pt tolerating weight bearing for 8-10 minutes without c/o pain or limb discomfort.    Residual limb condition  both scabs have come off wounds. Distal wound with complete granulation closure. Tibial crest wound has granualtion forming with no signs of infection. Size of wound is smallern at 75mm x 45mm. small amount of bloody drainage on prosthetic  socks when checked limb at beginning of PT session and no drainage at end of PT session.  Tibial crest appears to have some bone spurs forming in area.  color & temperature normal. minimal to no hair growth. cylinderical shape.    Education Provided Other (comment)   see prosthetic care comments                Balance Exercises - 07/14/21 1145       Balance Exercises: Standing   Standing Eyes Opened Wide (BOA);Foam/compliant surface;Head turns;5 reps   4 directions, foam beam minA / tactile cues   Standing Eyes Opened Limitations tactile, visual & verbal cues on equal weight bearing & balance reactions.    Standing Eyes Closed Wide (BOA);Foam/compliant surface;3 reps;10 secs   static stance   Standing Eyes Closed Limitations tactile cues on equal weight bearing & balance reactions.    Stepping Strategy Anterior;Posterior   3 reps ea LE ea direction   Stepping Strategy Limitations anticipatory stepping off foam beam stabilizing without touching bars to stabilize.  Progressed to reactionary.  RLE ant able to catch balance 3rd rep without touching but posteriorly required touch or assist ea rep.                  PT Short Term Goals - 07/08/21 1656       PT SHORT TERM GOAL #1   Title Patient verbalizes adjusting ply socks for prosthetic fit.    Status Achieved    Target Date 07/09/21      PT SHORT TERM GOAL #2   Title Patient tolerates prosthesis >12 hrs total /day without increase in skin issues    Status Achieved    Target Date 07/09/21      PT SHORT TERM GOAL #3   Title Patient able to pick up item from floor without UE support with supervision.    Status Achieved    Target  Date 07/09/21      PT SHORT TERM GOAL #4   Title Patient ambulates 200' with cane & prosthesis with supervision & no loss of balance.    Status Achieved    Target Date 07/09/21      PT SHORT TERM GOAL #5   Title Patient negotiates ramps & curbs with cane & prosthesis with min guard.     Status Achieved    Target Date 07/09/21               PT Long Term Goals - 05/18/21 1023       PT LONG TERM GOAL #1   Title Patient demonstrates & verbalized understanding of prosthetic care to enable safe utilization of prosthesis.    Time 12    Period Weeks    Status On-going    Target Date 07/30/21      PT LONG TERM GOAL #2   Title Patient tolerates prosthesis wear >90% of awake hours without skin or limb pain issues.    Time 12    Period Weeks    Status On-going    Target Date 07/30/21      PT LONG TERM GOAL #3   Title Berg Balance >/= 36/56 to indicate lower fall risk    Time 12    Period Weeks    Status On-going    Target Date 07/30/21      PT LONG TERM GOAL #4   Title Patient ambulates 300' with LRAD & prosthesis modified independent    Time 12    Period Weeks    Status On-going    Target Date 07/30/21      PT LONG TERM GOAL #5   Title Patient negotiates ramps, curbs & stairs with LRAD & prosthesis modified independent.    Time 12    Period Weeks    Status On-going    Target Date 07/30/21                   Plan - 07/14/21 1145     Clinical Impression Statement Prosthetist adjusted prosthesis 1/4" which is still too short but did improve balance.  PT progressed step strategy to reactionary which he is still delayed with prosthesis and requires assist to stabilize.    Personal Factors and Comorbidities Comorbidity 3+;Age;Time since onset of injury/illness/exacerbation    Comorbidities Rt TTA, arthritis, right hip replacement, left knee arthroplasty, shoulder sg X2, gout, basal cell CA, LBP, back sg X3, IDDM, neuropathy, diverticulosis, heart murmur, HTN, MI,    Examination-Activity Limitations Lift;Locomotion Level;Squat;Stairs;Stand;Transfers    Examination-Participation Restrictions Community Activity;Yard Work    [employer] Evolving/Moderate complexity    Rehab Potential Good    PT Frequency 2x / week    PT Duration  12 weeks    PT Treatment/Interventions ADLs/Self Care Home Management;DME Instruction;Gait training;Stair training;Functional mobility training;Therapeutic activities;Therapeutic exercise;Balance training;Neuromuscular re-education;Patient/family education;Prosthetic Training;Vestibular;Passive range of motion    PT Next Visit Plan standing balance including facilitating strategies, progress prosthetic gait with cane including ramps & curbs and household without device including carrying plate/cup,  check wounds, work towards [employer] and Agree with Plan of Care Patient             Patient will benefit from skilled therapeutic intervention in order to improve the following deficits and impairments:  Abnormal gait, Decreased activity tolerance, Decreased balance, Decreased endurance, Decreased knowledge of use of DME, Decreased mobility, Decreased range of motion, Decreased skin integrity, Decreased scar  mobility, Decreased strength, Increased edema, Impaired flexibility, Postural dysfunction, Prosthetic Dependency  Visit Diagnosis: Other abnormalities of gait and mobility  Unsteadiness on feet  Muscle weakness (generalized)  Stiffness of right knee, not elsewhere classified  Abnormal posture     Problem List Patient Active Problem List   Diagnosis Date Noted   Exudative age-related macular degeneration of right eye with active choroidal neovascularization ([place]) 06/01/2021   Intermediate stage nonexudative age-related macular degeneration of both eyes 06/01/2021   Type 2 diabetes mellitus with diabetic polyneuropathy, with long-term current use of insulin ([place]) 03/24/2021   Hypokalemia    Constipation 02/02/2021   Fecal impaction ([place]) 02/01/2021   Wound dehiscence 01/09/2021   Dehiscence of amputation stump ([place])    Status post percutaneous transluminal coronary angioplasty 01/06/2021   Type II diabetes mellitus, uncontrolled 01/06/2021   Acute pancreatitis 01/06/2021    Diabetic peripheral vascular disease ([place]) 01/06/2021   Encounter for screening for other disorder 01/06/2021   Enlarged prostate 01/06/2021   Foot ulcer, right ([place]) 01/06/2021   Gout 01/06/2021   Headache 01/06/2021   Neck pain 01/06/2021   Hypoglycemia 01/06/2021   Loss of appetite 01/06/2021   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   Peripheral neuropathy 01/06/2021   Sciatica 01/06/2021   Vitamin B12 deficiency 01/06/2021   Vitamin D deficiency 01/06/2021   Weakness 01/06/2021   Diabetes mellitus type 2 with neurological manifestations ([place]) 01/06/2021   Protein-calorie malnutrition, severe 12/26/2020   Acute blood loss anemia 12/26/2020   Prerenal azotemia 12/26/2020   Below-knee amputation of right lower extremity ([place]) 12/12/2020   Gangrene of right foot ([place])    Diabetic neuropathy ([place]) 11/17/2020   Hyperglycemia due to type 2 diabetes mellitus ([place]) 11/17/2020   Long term (current) use of insulin ([place]) 11/17/2020   Obesity 11/17/2020   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/2019   Hip dislocation, right ([place]) 04/13/2019   Other intervertebral disc degeneration, lumbar region 03/30/2019   CAD (coronary artery disease) 01/30/2019   Tobacco abuse 01/30/2019   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   Burn, foot, second degree, left, initial encounter 06/08/2017   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 03/16/2017   S/P total knee arthroplasty, left 10/26/2016   Hyperlipidemia 09/04/2014   Thrombocytopenia ([facility])    Precordial chest pain 04/05/2014   Coronary atherosclerosis of native coronary artery 10/01/2013   Other and unspecified hyperlipidemia 10/01/2013   Primary hypertension 10/01/2013   Diabetes mellitus ([place]) 10/01/2013   Esophageal reflux 10/01/2013   Hypertrophy of prostate without urinary obstruction and other lower urinary tract symptoms (LUTS) 10/01/2013    Jamey Reas, PT, DPT 07/14/2021, 2:43 PM  [facility] Physical  Therapy [address] [place], [place], [address] Phone: [phone]   Fax:  [phone]  Name: CARLYLE MCELRATH MRN: [id number] Date of Birth: 1941-05-17

## 2021-07-18 DIAGNOSIS — Z1152 Encounter for screening for COVID-19: Secondary | ICD-10-CM | POA: Diagnosis not present

## 2021-07-22 ENCOUNTER — Encounter: Payer: Self-pay | Admitting: Physical Therapy

## 2021-07-22 ENCOUNTER — Ambulatory Visit (INDEPENDENT_AMBULATORY_CARE_PROVIDER_SITE_OTHER): Payer: Medicare Other | Admitting: Physical Therapy

## 2021-07-22 ENCOUNTER — Other Ambulatory Visit: Payer: Self-pay

## 2021-07-22 DIAGNOSIS — R293 Abnormal posture: Secondary | ICD-10-CM

## 2021-07-22 DIAGNOSIS — Z23 Encounter for immunization: Secondary | ICD-10-CM | POA: Diagnosis not present

## 2021-07-22 DIAGNOSIS — M25661 Stiffness of right knee, not elsewhere classified: Secondary | ICD-10-CM

## 2021-07-22 DIAGNOSIS — R2689 Other abnormalities of gait and mobility: Secondary | ICD-10-CM | POA: Diagnosis not present

## 2021-07-22 DIAGNOSIS — M6281 Muscle weakness (generalized): Secondary | ICD-10-CM

## 2021-07-22 DIAGNOSIS — R2681 Unsteadiness on feet: Secondary | ICD-10-CM

## 2021-07-22 NOTE — Therapy (Signed)
[facility] Physical Therapy [address] [place], [place], [address] Phone: [phone]   Fax:  [phone]  Physical Therapy Treatment  Patient Details  Name: Keith Espinoza MRN: [id number] Date of Birth: 1941/07/05 Referring Provider (PT): Meridee Score, MD   Encounter Date: 07/22/2021   PT End of Session - 07/22/21 1513     Visit Number 17    Number of Visits 25    Date for PT Re-Evaluation 07/30/21    Authorization Type Medicare A&[employer] and [employer]    Progress Note Due on Visit 20    PT Start Time 1515    PT Stop Time 1601    PT Time Calculation (min) 46 min    Equipment Utilized During Treatment Gait belt    Activity Tolerance Patient tolerated treatment well    Behavior During Therapy [facility] for tasks assessed/performed             Past Medical History:  Diagnosis Date   Allergic rhinitis    Allergic rhinitis    Arthritis    Basal cell carcinoma 11/01/2019    bcc left chest treatment TX [id number]    Chronic leg pain    right   Chronic lower back pain    Coronary artery disease    a. Stenting to RCA 2004; staged DES to LAD and Cx 2004. DES to mRCA 2012. b. DES to mCx, PTCA to dCx 11/2011. c. Lateral wall MI 2013 s/p PTCA to distal Cx & DES to mid OM2 11/2011. d. Low risk nuc 04/2014, EF wnl.   COVID-19    Diabetes mellitus    Insulin dependent   Diabetic neuropathy ([place])    MILD   Diverticulosis    Dysrhythmia    [name] syndrome    Gout    right wrist; right foot; right elbow; have had it since 1970's   H/O hiatal hernia    Heart murmur    History of echocardiogram    aortic sclerosis per echo 12/09 EF 65%, otherwise normal   History of hemorrhoids    BLEEDING   History of kidney stones    h/o   Hypertension    Diagnosed 1995    Myocardial infarction [facility])    Pancreatic pseudocyst    a. s/p remote drainage 2006.   Thrombocytopenia ([place])    Seen on oldest labs in system from 2004   Vitamin B 12 deficiency    orally replaced     Past Surgical History:  Procedure Laterality Date   ABDOMINAL AORTOGRAM W/LOWER EXTREMITY Bilateral 08/08/2020   Procedure: ABDOMINAL AORTOGRAM W/LOWER EXTREMITY;  Surgeon: Angelia Mould, MD;  Location: [place] [facility];  Service: Cardiovascular;  Laterality: Bilateral;   AMPUTATION Right 12/12/2020   Procedure: RIGHT BELOW KNEE AMPUTATION;  Surgeon: [name], MD;  Location: [place];  Service: Orthopedics;  Laterality: Right;   BACK SURGERY     "total of 3 times" S/P fall    CARPAL TUNNEL RELEASE Bilateral    CHOLECYSTECTOMY  1990's   COLONOSCOPY     CORONARY ANGIOPLASTY  11/11/11   CORONARY ANGIOPLASTY WITH STENT PLACEMENT  09/30/2011   "1 then; makes a total of 4"   CORONARY ANGIOPLASTY WITH STENT PLACEMENT  11/11/11   "1; makes a total of 5"   INGUINAL HERNIA REPAIR  2003   right   JOINT REPLACEMENT Right 04/03/2002   hip replacment   KNEE ARTHROSCOPY  1990's   left   LEFT HEART CATHETERIZATION  WITH CORONARY ANGIOGRAM N/A 09/30/2011   Procedure: LEFT HEART CATHETERIZATION WITH CORONARY ANGIOGRAM;  Surgeon: Jettie Booze, MD;  Location: [facility] CATH LAB;  Service: Cardiovascular;  Laterality: N/A;  possible PCI   LEFT HEART CATHETERIZATION WITH CORONARY ANGIOGRAM N/A 11/15/2011   Procedure: LEFT HEART CATHETERIZATION WITH CORONARY ANGIOGRAM;  Surgeon: Jettie Booze, MD;  Location: [facility] CATH LAB;  Service: Cardiovascular;  Laterality: N/A;   PERCUTANEOUS CORONARY STENT INTERVENTION (PCI-S)  09/30/2011   Procedure: PERCUTANEOUS CORONARY STENT INTERVENTION (PCI-S);  Surgeon: Jettie Booze, MD;  Location: [facility] CATH LAB;  Service: Cardiovascular;;   PERCUTANEOUS CORONARY STENT INTERVENTION (PCI-S) N/A 11/11/2011   Procedure: PERCUTANEOUS CORONARY STENT INTERVENTION (PCI-S);  Surgeon: Jettie Booze, MD;  Location: [facility] CATH LAB;  Service: Cardiovascular;  Laterality: N/A;   PERIPHERAL VASCULAR BALLOON ANGIOPLASTY Right 08/08/2020   Procedure: PERIPHERAL VASCULAR  BALLOON ANGIOPLASTY;  Surgeon: Angelia Mould, MD;  Location: [place] [facility];  Service: Cardiovascular;  Laterality: Right;  Posterior tibial    SHOULDER SURGERY Right    X 2   STUMP REVISION Right 01/09/2021   Procedure: REVISION RIGHT BELOW KNEE AMPUTATION;  Surgeon: [name], MD;  Location: [place];  Service: Orthopedics;  Laterality: Right;   TONSILLECTOMY  ~ [place] Right 04/13/2019   Procedure: RIGHT TOTAL HIP REVISION-POSTERIOR  APPROACH LATERAL;  Surgeon: Marybelle Killings, MD;  Location: [place];  Service: Orthopedics;  Laterality: Right;   TOTAL KNEE ARTHROPLASTY Left 07/23/2016   Procedure: LEFT TOTAL KNEE ARTHROPLASTY;  Surgeon: Marybelle Killings, MD;  Location: [place];  Service: Orthopedics;  Laterality: Left;    There were no vitals filed for this visit.   Subjective Assessment - 07/22/21 1514     Subjective He has been using cane mainly for last few days.  He is wearing prosthesis most of awake hours.    Patient is accompained by: Family member   granddaughter   Pertinent History Rt TTA, arthritis, right hip replacement, left knee arthroplasty, shoulder sg X2, gout, basal cell CA, LBP, back sg X3, IDDM, neuropathy, diverticulosis, heart murmur, HTN, MI,    Limitations Standing;House hold activities;Walking    Patient Stated Goals walk in community & on farm, get on tractor, yard work, drive    Currently in Pain? No/denies    Pain Onset More than a month ago                               [facility] Adult PT Treatment/Exercise - 07/22/21 1515       Transfers   Transfers Sit to Stand;Stand to Sit    Sit to Stand 5: Supervision;With upper extremity assist;From chair/3-in-1;Other (comment)   no touch to stabilize   Stand to Sit 5: Supervision;With upper extremity assist;To chair/3-in-1;Other (comment)      Ambulation/Gait   Ambulation/Gait Yes    Ambulation/Gait Assistance 5: Supervision   cane   Ambulation/Gait Assistance Details  worked on scanning right/left & up/down maintaining balance, path & pace    Ambulation Distance (Feet) 250 Feet   250' cane & 100' X 2 without AD   Assistive device Straight cane;Prosthesis;None   cane stand alone tip.   Ramp 5: Supervision   cane stand alone tip & TTA prosthesis   Curb 5: Supervision   cane stand alone tip & TTA prosthesis     Knee/Hip Exercises: Stretches   Active Hamstring Stretch Both;2  reps;30 seconds    Active Hamstring Stretch Limitations supine w/strap      Knee/Hip Exercises: Machines for Strengthening   Total Gym Leg Press BLEs 100# 15 reps 2 sets 1st set back 45* & 2nd set back flat      Knee/Hip Exercises: Standing   Hip Flexion Stengthening;Both;1 set;10 reps;Knee straight   standing on foam, BUE support on PT hands   Hip Flexion Limitations alternating LEs for balance with visual, verbal & tactile cues for balance & technique    Hip Abduction Stengthening;Both;1 set;10 reps;Knee straight   standing on foam, BUE support on PT hands   Abduction Limitations alternating LEs for balance with visual, verbal & tactile cues for balance & technique    Hip Extension Stengthening;Both;1 set;10 reps;Knee straight   standing on foam, BUE support on PT hands   Extension Limitations alternating LEs for balance with visual, verbal & tactile cues for balance & technique    Rocker Board 1 minute   ant/level/post & right/level/left with round board w/single pivot point, BUE support with cues to minimize weight bearing   Rocker Board Limitations demo & verbal cues on technique prior and visual(mirror) & tactile cues during. working on hip strategy for controlling motion 4 directions with recovery to midline.      Prosthetics   Prosthetic Care Comments  PT demo & verbal cues on mobilization with fingers on ea side of wound & prox/distal and med/lat motions.  PT instructed in modifying boot if heel ht correct as prosthetic foot will not planterflex.  Reviewed adjusting ply socks.   Prosthesis still appears ~1/4" too short. he sees prosthetist on 10/24 prior to PT.    Current prosthetic wear tolerance (days/week)  daily    Current prosthetic wear tolerance (#hours/day)  most of awake hours except 30-74min midday.    Current prosthetic weight-bearing tolerance (hours/day)  Pt tolerating weight bearing for 8-10 minutes without c/o pain or limb discomfort.    Residual limb condition  both scabs have come off wounds. Distal wound with complete granulation closure. Tibial crest wound has granualtion forming with no signs of infection. Size of wound is smallern at 49mm x 34mm. small amount of bloody drainage on prosthetic socks when checked limb at beginning of PT session and no drainage at end of PT session.  Tibial crest appears to have some bone spurs forming in area.  color & temperature normal. minimal to no hair growth. cylinderical shape.    Education Provided Other (comment);Correct ply sock adjustment;Residual limb care   see prosthetic care comments   Person(s) Educated Patient    Education Method Explanation;Demonstration;Tactile cues;Verbal cues    Education Method Verbalized understanding;Needs further instruction    Donning Prosthesis Modified independent (device/increased time)                 Balance Exercises - 07/22/21 1515       Balance Exercises: Standing   Standing Eyes Opened Wide ([place]);Foam/compliant surface;Head turns;5 reps   4 directions, foam beam minA / tactile cues   Standing Eyes Opened Limitations tactile, visual & verbal cues on equal weight bearing & balance reactions.    Standing Eyes Closed Wide (BOA);Foam/compliant surface;3 reps;10 secs   static stance   Standing Eyes Closed Limitations tactile cues on equal weight bearing & balance reactions.                  PT Short Term Goals - 07/08/21 1656       PT SHORT  TERM GOAL #1   Title Patient verbalizes adjusting ply socks for prosthetic fit.    Status Achieved    Target Date  07/09/21      PT SHORT TERM GOAL #2   Title Patient tolerates prosthesis >12 hrs total /day without increase in skin issues    Status Achieved    Target Date 07/09/21      PT SHORT TERM GOAL #3   Title Patient able to pick up item from floor without UE support with supervision.    Status Achieved    Target Date 07/09/21      PT SHORT TERM GOAL #4   Title Patient ambulates 200' with cane & prosthesis with supervision & no loss of balance.    Status Achieved    Target Date 07/09/21      PT SHORT TERM GOAL #5   Title Patient negotiates ramps & curbs with cane & prosthesis with min guard.    Status Achieved    Target Date 07/09/21               PT Long Term Goals - 05/18/21 1023       PT LONG TERM GOAL #1   Title Patient demonstrates & verbalized understanding of prosthetic care to enable safe utilization of prosthesis.    Time 12    Period Weeks    Status On-going    Target Date 07/30/21      PT LONG TERM GOAL #2   Title Patient tolerates prosthesis wear >90% of awake hours without skin or limb pain issues.    Time 12    Period Weeks    Status On-going    Target Date 07/30/21      PT LONG TERM GOAL #3   Title [place] Balance >/= 36/56 to indicate lower fall risk    Time 12    Period Weeks    Status On-going    Target Date 07/30/21      PT LONG TERM GOAL #4   Title Patient ambulates 300' with LRAD & prosthesis modified independent    Time 12    Period Weeks    Status On-going    Target Date 07/30/21      PT LONG TERM GOAL #5   Title Patient negotiates ramps, curbs & stairs with LRAD & prosthesis modified independent.    Time 12    Period Weeks    Status On-going    Target Date 07/30/21                   Plan - 07/22/21 1513     Clinical Impression Statement Patient's balance appears to be slowly improving.  His wound continues to heal and he seems to understand mobilization to prevent adherance to bone.    Personal Factors and Comorbidities  Comorbidity 3+;Age;Time since onset of injury/illness/exacerbation    Comorbidities Rt TTA, arthritis, right hip replacement, left knee arthroplasty, shoulder sg X2, gout, basal cell CA, LBP, back sg X3, IDDM, neuropathy, diverticulosis, heart murmur, HTN, MI,    Examination-Activity Limitations Lift;Locomotion Level;Squat;Stairs;Stand;Transfers    Examination-Participation Restrictions Community Activity;Yard Work    [employer] Evolving/Moderate complexity    Rehab Potential Good    PT Frequency 2x / week    PT Duration 12 weeks    PT Treatment/Interventions ADLs/Self Care Home Management;DME Instruction;Gait training;Stair training;Functional mobility training;Therapeutic activities;Therapeutic exercise;Balance training;Neuromuscular re-education;Patient/family education;Prosthetic Training;Vestibular;Passive range of motion    PT Next Visit Plan standing balance including facilitating strategies,  progress prosthetic gait with cane including ramps & curbs and household without device including carrying plate/cup,  check wounds, work towards [employer] and Agree with Plan of Care Patient             Patient will benefit from skilled therapeutic intervention in order to improve the following deficits and impairments:  Abnormal gait, Decreased activity tolerance, Decreased balance, Decreased endurance, Decreased knowledge of use of DME, Decreased mobility, Decreased range of motion, Decreased skin integrity, Decreased scar mobility, Decreased strength, Increased edema, Impaired flexibility, Postural dysfunction, Prosthetic Dependency  Visit Diagnosis: Other abnormalities of gait and mobility  Unsteadiness on feet  Stiffness of right knee, not elsewhere classified  Muscle weakness (generalized)  Abnormal posture     Problem List Patient Active Problem List   Diagnosis Date Noted   Exudative age-related macular degeneration of right eye with active  choroidal neovascularization ([place]) 06/01/2021   Intermediate stage nonexudative age-related macular degeneration of both eyes 06/01/2021   Type 2 diabetes mellitus with diabetic polyneuropathy, with long-term current use of insulin ([place]) 03/24/2021   Hypokalemia    Constipation 02/02/2021   Fecal impaction ([place]) 02/01/2021   Wound dehiscence 01/09/2021   Dehiscence of amputation stump ([place])    Status post percutaneous transluminal coronary angioplasty 01/06/2021   Type II diabetes mellitus, uncontrolled 01/06/2021   Acute pancreatitis 01/06/2021   Diabetic peripheral vascular disease ([place]) 01/06/2021   Encounter for screening for other disorder 01/06/2021   Enlarged prostate 01/06/2021   Foot ulcer, right ([place]) 01/06/2021   Gout 01/06/2021   Headache 01/06/2021   Neck pain 01/06/2021   Hypoglycemia 01/06/2021   Loss of appetite 01/06/2021   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   Peripheral neuropathy 01/06/2021   Sciatica 01/06/2021   Vitamin B12 deficiency 01/06/2021   Vitamin D deficiency 01/06/2021   Weakness 01/06/2021   Diabetes mellitus type 2 with neurological manifestations ([place]) 01/06/2021   Protein-calorie malnutrition, severe 12/26/2020   Acute blood loss anemia 12/26/2020   Prerenal azotemia 12/26/2020   Below-knee amputation of right lower extremity ([place]) 12/12/2020   Gangrene of right foot ([place])    Diabetic neuropathy ([place]) 11/17/2020   Hyperglycemia due to type 2 diabetes mellitus ([place]) 11/17/2020   Long term (current) use of insulin ([place]) 11/17/2020   Obesity 11/17/2020   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/2019   Hip dislocation, right ([place]) 04/13/2019   Other intervertebral disc degeneration, lumbar region 03/30/2019   CAD (coronary artery disease) 01/30/2019   Tobacco abuse 01/30/2019   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   Burn, foot, second degree, left, initial encounter 06/08/2017   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  03/16/2017   S/P total knee arthroplasty, left 10/26/2016   Hyperlipidemia 09/04/2014   Thrombocytopenia ([facility])    Precordial chest pain 04/05/2014   Coronary atherosclerosis of native coronary artery 10/01/2013   Other and unspecified hyperlipidemia 10/01/2013   Primary hypertension 10/01/2013   Diabetes mellitus ([place]) 10/01/2013   Esophageal reflux 10/01/2013   Hypertrophy of prostate without urinary obstruction and other lower urinary tract symptoms (LUTS) 10/01/2013    Jamey Reas, PT, DPT 07/22/2021, 4:11 PM  [facility] [address] [place], [place], [address] Phone: [phone]   Fax:  [phone]  Name: OSEIAS HORSEY MRN: [id number] Date of Birth: Jun 25, 1941

## 2021-07-23 ENCOUNTER — Ambulatory Visit (INDEPENDENT_AMBULATORY_CARE_PROVIDER_SITE_OTHER): Payer: Medicare Other | Admitting: Physical Therapy

## 2021-07-23 DIAGNOSIS — R2689 Other abnormalities of gait and mobility: Secondary | ICD-10-CM

## 2021-07-23 DIAGNOSIS — M6281 Muscle weakness (generalized): Secondary | ICD-10-CM

## 2021-07-23 DIAGNOSIS — M25661 Stiffness of right knee, not elsewhere classified: Secondary | ICD-10-CM | POA: Diagnosis not present

## 2021-07-23 DIAGNOSIS — R2681 Unsteadiness on feet: Secondary | ICD-10-CM | POA: Diagnosis not present

## 2021-07-23 DIAGNOSIS — R293 Abnormal posture: Secondary | ICD-10-CM

## 2021-07-23 NOTE — Therapy (Signed)
[facility] Physical Therapy [address] [place], [place], [address] Phone: [phone]   Fax:  [phone]  Physical Therapy Treatment  Patient Details  Name: Keith Espinoza MRN: [id number] Date of Birth: 01-01-1941 Referring Provider (PT): Meridee Score, MD   Encounter Date: 07/23/2021   PT End of Session - 07/23/21 1644     Visit Number 18    Number of Visits 25    Date for PT Re-Evaluation 07/30/21    Authorization Type Medicare A[employer] and [employer]    Progress Note Due on Visit 20    PT Start Time 1555    PT Stop Time 1635    PT Time Calculation (min) 40 min    Equipment Utilized During Treatment Gait belt    Activity Tolerance Patient tolerated treatment well    Behavior During Therapy [facility] for tasks assessed/performed             Past Medical History:  Diagnosis Date   Allergic rhinitis    Allergic rhinitis    Arthritis    Basal cell carcinoma 11/01/2019    bcc left chest treatment [place] cx3 3fu    Chronic leg pain    right   Chronic lower back pain    Coronary artery disease    a. Stenting to RCA 2004; staged DES to LAD and Cx 2004. DES to mRCA 2012. b. DES to mCx, PTCA to dCx 11/2011. c. Lateral wall MI 2013 s/p PTCA to distal Cx & DES to mid OM2 11/2011. d. Low risk nuc 04/2014, EF wnl.   COVID-19    Diabetes mellitus    Insulin dependent   Diabetic neuropathy ([place])    MILD   Diverticulosis    Dysrhythmia    [name] syndrome    Gout    right wrist; right foot; right elbow; have had it since 1970's   H/O hiatal hernia    Heart murmur    History of echocardiogram    aortic sclerosis per echo 12/09 EF 65%, otherwise normal   History of hemorrhoids    BLEEDING   History of kidney stones    h/o   Hypertension    Diagnosed 1995    Myocardial infarction [facility])    Pancreatic pseudocyst    a. s/p remote drainage 2006.   Thrombocytopenia ([place])    Seen on oldest labs in system from 2004   Vitamin B 12 deficiency    orally replaced     Past Surgical History:  Procedure Laterality Date   ABDOMINAL AORTOGRAM W/LOWER EXTREMITY Bilateral 08/08/2020   Procedure: ABDOMINAL AORTOGRAM W/LOWER EXTREMITY;  Surgeon: Angelia Mould, MD;  Location: [place] [facility];  Service: Cardiovascular;  Laterality: Bilateral;   AMPUTATION Right 12/12/2020   Procedure: RIGHT BELOW KNEE AMPUTATION;  Surgeon: [name], MD;  Location: [place];  Service: Orthopedics;  Laterality: Right;   BACK SURGERY     "total of 3 times" S/P fall    CARPAL TUNNEL RELEASE Bilateral    CHOLECYSTECTOMY  1990's   COLONOSCOPY     CORONARY ANGIOPLASTY  11/11/11   CORONARY ANGIOPLASTY WITH STENT PLACEMENT  09/30/2011   "1 then; makes a total of 4"   CORONARY ANGIOPLASTY WITH STENT PLACEMENT  11/11/11   "1; makes a total of 5"   INGUINAL HERNIA REPAIR  2003   right   JOINT REPLACEMENT Right 04/03/2002   hip replacment   KNEE ARTHROSCOPY  1990's   left   LEFT HEART CATHETERIZATION  WITH CORONARY ANGIOGRAM N/A 09/30/2011   Procedure: LEFT HEART CATHETERIZATION WITH CORONARY ANGIOGRAM;  Surgeon: Jettie Booze, MD;  Location: [facility] CATH LAB;  Service: Cardiovascular;  Laterality: N/A;  possible PCI   LEFT HEART CATHETERIZATION WITH CORONARY ANGIOGRAM N/A 11/15/2011   Procedure: LEFT HEART CATHETERIZATION WITH CORONARY ANGIOGRAM;  Surgeon: Jettie Booze, MD;  Location: [facility] CATH LAB;  Service: Cardiovascular;  Laterality: N/A;   PERCUTANEOUS CORONARY STENT INTERVENTION (PCI-S)  09/30/2011   Procedure: PERCUTANEOUS CORONARY STENT INTERVENTION (PCI-S);  Surgeon: Jettie Booze, MD;  Location: [facility] CATH LAB;  Service: Cardiovascular;;   PERCUTANEOUS CORONARY STENT INTERVENTION (PCI-S) N/A 11/11/2011   Procedure: PERCUTANEOUS CORONARY STENT INTERVENTION (PCI-S);  Surgeon: Jettie Booze, MD;  Location: [facility] CATH LAB;  Service: Cardiovascular;  Laterality: N/A;   PERIPHERAL VASCULAR BALLOON ANGIOPLASTY Right 08/08/2020   Procedure: PERIPHERAL VASCULAR  BALLOON ANGIOPLASTY;  Surgeon: Angelia Mould, MD;  Location: [place] [facility];  Service: Cardiovascular;  Laterality: Right;  Posterior tibial    SHOULDER SURGERY Right    X 2   STUMP REVISION Right 01/09/2021   Procedure: REVISION RIGHT BELOW KNEE AMPUTATION;  Surgeon: [name], MD;  Location: [place];  Service: Orthopedics;  Laterality: Right;   TONSILLECTOMY  ~ [place] Right 04/13/2019   Procedure: RIGHT TOTAL HIP REVISION-POSTERIOR  APPROACH LATERAL;  Surgeon: Marybelle Killings, MD;  Location: [place];  Service: Orthopedics;  Laterality: Right;   TOTAL KNEE ARTHROPLASTY Left 07/23/2016   Procedure: LEFT TOTAL KNEE ARTHROPLASTY;  Surgeon: Marybelle Killings, MD;  Location: [place];  Service: Orthopedics;  Laterality: Left;    There were no vitals filed for this visit.   Subjective Assessment - 07/23/21 1635     Subjective He relays 8/10 pain upon standing activiy today in his anterior residual limb. He says he walked a lot yesterday with his cane and must have overdid it because he has so much pain today.    Patient is accompained by: Family member   granddaughter   Pertinent History Rt TTA, arthritis, right hip replacement, left knee arthroplasty, shoulder sg X2, gout, basal cell CA, LBP, back sg X3, IDDM, neuropathy, diverticulosis, heart murmur, HTN, MI,    Limitations Standing;House hold activities;Walking    Patient Stated Goals walk in community & on farm, get on tractor, yard work, drive    Pain Onset More than a month ago                               [facility] Adult PT Treatment/Exercise - 07/23/21 0001       Ambulation/Gait   Gait Comments he had too much pain to ambulate today      Neuro Re-ed    Neuro Re-ed Details  balance on foam pad wide BOS 1 minute then progressed to head turns X10 and head nods X 10, had to sit down after this due to knee pain      Knee/Hip Exercises: Stretches   Active Hamstring Stretch Both;2 reps;30 seconds     Active Hamstring Stretch Limitations supine w/strap      Knee/Hip Exercises: Aerobic   Nustep L7 X 8 min UE/LE      Knee/Hip Exercises: Machines for Strengthening   Cybex Knee Extension bilat 5# 4X15    Cybex Knee Flexion bilat 25# 4X15    Total Gym Leg Press BLEs 100# 15 reps X 3 sets  Prosthetics   Prosthetic Care Comments  his Rt prosthetic appeared to be rotated laterally. We had him sit and remove prosthesis and and socks and liner was found to be rotated, we took this off and reapplied which helped some however he still had pain with standing limiting session.                       PT Short Term Goals - 07/08/21 1656       PT SHORT TERM GOAL #1   Title Patient verbalizes adjusting ply socks for prosthetic fit.    Status Achieved    Target Date 07/09/21      PT SHORT TERM GOAL #2   Title Patient tolerates prosthesis >12 hrs total /day without increase in skin issues    Status Achieved    Target Date 07/09/21      PT SHORT TERM GOAL #3   Title Patient able to pick up item from floor without UE support with supervision.    Status Achieved    Target Date 07/09/21      PT SHORT TERM GOAL #4   Title Patient ambulates 200' with cane & prosthesis with supervision & no loss of balance.    Status Achieved    Target Date 07/09/21      PT SHORT TERM GOAL #5   Title Patient negotiates ramps & curbs with cane & prosthesis with min guard.    Status Achieved    Target Date 07/09/21               PT Long Term Goals - 05/18/21 1023       PT LONG TERM GOAL #1   Title Patient demonstrates & verbalized understanding of prosthetic care to enable safe utilization of prosthesis.    Time 12    Period Weeks    Status On-going    Target Date 07/30/21      PT LONG TERM GOAL #2   Title Patient tolerates prosthesis wear >90% of awake hours without skin or limb pain issues.    Time 12    Period Weeks    Status On-going    Target Date 07/30/21      PT LONG  TERM GOAL #3   Title Berg Balance >/= 36/56 to indicate lower fall risk    Time 12    Period Weeks    Status On-going    Target Date 07/30/21      PT LONG TERM GOAL #4   Title Patient ambulates 300' with LRAD & prosthesis modified independent    Time 12    Period Weeks    Status On-going    Target Date 07/30/21      PT LONG TERM GOAL #5   Title Patient negotiates ramps, curbs & stairs with LRAD & prosthesis modified independent.    Time 12    Period Weeks    Status On-going    Target Date 07/30/21                   Plan - 07/23/21 1646     Clinical Impression Statement Session limited by pain in standing today likely from too much activity yesterday. Instead we worked on more seated strengthening program with much better tolerance to this. He will see prosthetist monday and he was encouraged to let them know about his painful areas.    Personal Factors and Comorbidities Comorbidity 3+;Age;Time since onset of injury/illness/exacerbation    Comorbidities  Rt TTA, arthritis, right hip replacement, left knee arthroplasty, shoulder sg X2, gout, basal cell CA, LBP, back sg X3, IDDM, neuropathy, diverticulosis, heart murmur, HTN, MI,    Examination-Activity Limitations Lift;Locomotion Level;Squat;Stairs;Stand;Transfers    Examination-Participation Restrictions Community Activity;Yard Work    [employer] Evolving/Moderate complexity    Rehab Potential Good    PT Frequency 2x / week    PT Duration 12 weeks    PT Treatment/Interventions ADLs/Self Care Home Management;DME Instruction;Gait training;Stair training;Functional mobility training;Therapeutic activities;Therapeutic exercise;Balance training;Neuromuscular re-education;Patient/family education;Prosthetic Training;Vestibular;Passive range of motion    PT Next Visit Plan what did prosthetist say?    Consulted and Agree with Plan of Care Patient             Patient will benefit from skilled  therapeutic intervention in order to improve the following deficits and impairments:  Abnormal gait, Decreased activity tolerance, Decreased balance, Decreased endurance, Decreased knowledge of use of DME, Decreased mobility, Decreased range of motion, Decreased skin integrity, Decreased scar mobility, Decreased strength, Increased edema, Impaired flexibility, Postural dysfunction, Prosthetic Dependency  Visit Diagnosis: Other abnormalities of gait and mobility  Unsteadiness on feet  Stiffness of right knee, not elsewhere classified  Muscle weakness (generalized)  Abnormal posture     Problem List Patient Active Problem List   Diagnosis Date Noted   Exudative age-related macular degeneration of right eye with active choroidal neovascularization ([place]) 06/01/2021   Intermediate stage nonexudative age-related macular degeneration of both eyes 06/01/2021   Type 2 diabetes mellitus with diabetic polyneuropathy, with long-term current use of insulin ([place]) 03/24/2021   Hypokalemia    Constipation 02/02/2021   Fecal impaction ([place]) 02/01/2021   Wound dehiscence 01/09/2021   Dehiscence of amputation stump ([place])    Status post percutaneous transluminal coronary angioplasty 01/06/2021   Type II diabetes mellitus, uncontrolled 01/06/2021   Acute pancreatitis 01/06/2021   Diabetic peripheral vascular disease ([place]) 01/06/2021   Encounter for screening for other disorder 01/06/2021   Enlarged prostate 01/06/2021   Foot ulcer, right ([place]) 01/06/2021   Gout 01/06/2021   Headache 01/06/2021   Neck pain 01/06/2021   Hypoglycemia 01/06/2021   Loss of appetite 01/06/2021   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   Peripheral neuropathy 01/06/2021   Sciatica 01/06/2021   Vitamin B12 deficiency 01/06/2021   Vitamin D deficiency 01/06/2021   Weakness 01/06/2021   Diabetes mellitus type 2 with neurological manifestations ([place]) 01/06/2021   Protein-calorie malnutrition, severe 12/26/2020    Acute blood loss anemia 12/26/2020   Prerenal azotemia 12/26/2020   Below-knee amputation of right lower extremity ([place]) 12/12/2020   Gangrene of right foot ([place])    Diabetic neuropathy ([place]) 11/17/2020   Hyperglycemia due to type 2 diabetes mellitus ([place]) 11/17/2020   Long term (current) use of insulin ([place]) 11/17/2020   Obesity 11/17/2020   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/2019   Hip dislocation, right ([place]) 04/13/2019   Other intervertebral disc degeneration, lumbar region 03/30/2019   CAD (coronary artery disease) 01/30/2019   Tobacco abuse 01/30/2019   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   Burn, foot, second degree, left, initial encounter 06/08/2017   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 03/16/2017   S/P total knee arthroplasty, left 10/26/2016   Hyperlipidemia 09/04/2014   Thrombocytopenia ([facility])    Precordial chest pain 04/05/2014   Coronary atherosclerosis of native coronary artery 10/01/2013   Other and unspecified hyperlipidemia 10/01/2013   Primary hypertension 10/01/2013   Diabetes mellitus ([place]) 10/01/2013   Esophageal reflux 10/01/2013  Hypertrophy of prostate without urinary obstruction and other lower urinary tract symptoms (LUTS) 10/01/2013    Debbe Odea, PT,DPT 07/23/2021, 4:49 PM  [facility] Physical Therapy [address] [place], [place], [address] Phone: [phone]   Fax:  [phone]  Name: FIRAS GUARDADO MRN: [id number] Date of Birth: 01-29-1941

## 2021-07-27 ENCOUNTER — Encounter: Payer: Self-pay | Admitting: Physical Therapy

## 2021-07-27 ENCOUNTER — Other Ambulatory Visit: Payer: Self-pay

## 2021-07-27 ENCOUNTER — Ambulatory Visit (INDEPENDENT_AMBULATORY_CARE_PROVIDER_SITE_OTHER): Payer: Medicare Other | Admitting: Physical Therapy

## 2021-07-27 DIAGNOSIS — M25661 Stiffness of right knee, not elsewhere classified: Secondary | ICD-10-CM

## 2021-07-27 DIAGNOSIS — R2689 Other abnormalities of gait and mobility: Secondary | ICD-10-CM

## 2021-07-27 DIAGNOSIS — R293 Abnormal posture: Secondary | ICD-10-CM

## 2021-07-27 DIAGNOSIS — M6281 Muscle weakness (generalized): Secondary | ICD-10-CM | POA: Diagnosis not present

## 2021-07-27 DIAGNOSIS — R2681 Unsteadiness on feet: Secondary | ICD-10-CM | POA: Diagnosis not present

## 2021-07-27 NOTE — Therapy (Signed)
[facility] Physical Therapy [address] [place], [place], [address] Phone: [phone]   Fax:  [phone]  Physical Therapy Treatment  Patient Details  Name: Keith Espinoza MRN: [id number] Date of Birth: 1940/12/03 Referring Provider (PT): Meridee Score, MD   Encounter Date: 07/27/2021   PT End of Session - 07/27/21 1553     Visit Number 19    Number of Visits 25    Date for PT Re-Evaluation 07/30/21    Authorization Type Medicare A&B and [employer]    Progress Note Due on Visit 20    PT Start Time 1545    PT Stop Time 1630    PT Time Calculation (min) 45 min    Activity Tolerance Patient tolerated treatment well    Behavior During Therapy [facility] for tasks assessed/performed             Past Medical History:  Diagnosis Date   Allergic rhinitis    Allergic rhinitis    Arthritis    Basal cell carcinoma 11/01/2019    bcc left chest treatment [place] cx3 24fu    Chronic leg pain    right   Chronic lower back pain    Coronary artery disease    a. Stenting to RCA 2004; staged DES to LAD and Cx 2004. DES to mRCA 2012. b. DES to mCx, PTCA to dCx 11/2011. c. Lateral wall MI 2013 s/p PTCA to distal Cx & DES to mid OM2 11/2011. d. Low risk nuc 04/2014, EF wnl.   COVID-19    Diabetes mellitus    Insulin dependent   Diabetic neuropathy ([place])    MILD   Diverticulosis    Dysrhythmia    [name] syndrome    Gout    right wrist; right foot; right elbow; have had it since 1970's   H/O hiatal hernia    Heart murmur    History of echocardiogram    aortic sclerosis per echo 12/09 EF 65%, otherwise normal   History of hemorrhoids    BLEEDING   History of kidney stones    h/o   Hypertension    Diagnosed 1995    Myocardial infarction [facility])    Pancreatic pseudocyst    a. s/p remote drainage 2006.   Thrombocytopenia ([place])    Seen on oldest labs in system from 2004   Vitamin B 12 deficiency    orally replaced    Past Surgical History:  Procedure Laterality  Date   ABDOMINAL AORTOGRAM W/LOWER EXTREMITY Bilateral 08/08/2020   Procedure: ABDOMINAL AORTOGRAM W/LOWER EXTREMITY;  Surgeon: Angelia Mould, MD;  Location: [place] CV LAB;  Service: Cardiovascular;  Laterality: Bilateral;   AMPUTATION Right 12/12/2020   Procedure: RIGHT BELOW KNEE AMPUTATION;  Surgeon: [name], MD;  Location: [place];  Service: Orthopedics;  Laterality: Right;   BACK SURGERY     "total of 3 times" S/P fall    CARPAL TUNNEL RELEASE Bilateral    CHOLECYSTECTOMY  1990's   COLONOSCOPY     CORONARY ANGIOPLASTY  11/11/11   CORONARY ANGIOPLASTY WITH STENT PLACEMENT  09/30/2011   "1 then; makes a total of 4"   CORONARY ANGIOPLASTY WITH STENT PLACEMENT  11/11/11   "1; makes a total of 5"   INGUINAL HERNIA REPAIR  2003   right   JOINT REPLACEMENT Right 04/03/2002   hip replacment   KNEE ARTHROSCOPY  1990's   left   LEFT HEART CATHETERIZATION WITH CORONARY ANGIOGRAM N/A 09/30/2011   Procedure: LEFT  HEART CATHETERIZATION WITH CORONARY ANGIOGRAM;  Surgeon: Jettie Booze, MD;  Location: [facility] CATH LAB;  Service: Cardiovascular;  Laterality: N/A;  possible PCI   LEFT HEART CATHETERIZATION WITH CORONARY ANGIOGRAM N/A 11/15/2011   Procedure: LEFT HEART CATHETERIZATION WITH CORONARY ANGIOGRAM;  Surgeon: Jettie Booze, MD;  Location: [facility] CATH LAB;  Service: Cardiovascular;  Laterality: N/A;   PERCUTANEOUS CORONARY STENT INTERVENTION (PCI-S)  09/30/2011   Procedure: PERCUTANEOUS CORONARY STENT INTERVENTION (PCI-S);  Surgeon: Jettie Booze, MD;  Location: [facility] CATH LAB;  Service: Cardiovascular;;   PERCUTANEOUS CORONARY STENT INTERVENTION (PCI-S) N/A 11/11/2011   Procedure: PERCUTANEOUS CORONARY STENT INTERVENTION (PCI-S);  Surgeon: Jettie Booze, MD;  Location: [facility] CATH LAB;  Service: Cardiovascular;  Laterality: N/A;   PERIPHERAL VASCULAR BALLOON ANGIOPLASTY Right 08/08/2020   Procedure: PERIPHERAL VASCULAR BALLOON ANGIOPLASTY;  Surgeon: Angelia Mould,  MD;  Location: [place] CV LAB;  Service: Cardiovascular;  Laterality: Right;  Posterior tibial    SHOULDER SURGERY Right    X 2   STUMP REVISION Right 01/09/2021   Procedure: REVISION RIGHT BELOW KNEE AMPUTATION;  Surgeon: [name], MD;  Location: [place];  Service: Orthopedics;  Laterality: Right;   TONSILLECTOMY  ~ [place] Right 04/13/2019   Procedure: RIGHT TOTAL HIP REVISION-POSTERIOR  APPROACH LATERAL;  Surgeon: Marybelle Killings, MD;  Location: [place];  Service: Orthopedics;  Laterality: Right;   TOTAL KNEE ARTHROPLASTY Left 07/23/2016   Procedure: LEFT TOTAL KNEE ARTHROPLASTY;  Surgeon: Marybelle Killings, MD;  Location: [place];  Service: Orthopedics;  Laterality: Left;    There were no vitals filed for this visit.   Subjective Assessment - 07/27/21 1604     Subjective He saw prosthetist today and patient reports he is not sure if any adjustments were made but that he is not having the pain that he was having last session.    Patient is accompained by: Family member   granddaughter   Pertinent History Rt TTA, arthritis, right hip replacement, left knee arthroplasty, shoulder sg X2, gout, basal cell CA, LBP, back sg X3, IDDM, neuropathy, diverticulosis, heart murmur, HTN, MI,    Limitations Standing;House hold activities;Walking    Patient Stated Goals walk in community & on farm, get on tractor, yard work, drive    Pain Onset More than a month ago              [facility] Adult PT Treatment/Exercise - 07/27/21 0001       Transfers   Transfers Sit to Stand;Stand to Sit    Sit to Stand 5: Supervision;With upper extremity assist;From chair/3-in-1;Other (comment)   no touch to stabilize   Stand to Sit 5: Supervision;With upper extremity assist;To chair/3-in-1;Other (comment)      Ambulation/Gait   Ambulation/Gait Yes    Ambulation/Gait Assistance 4: Min guard;5: Supervision    Ambulation/Gait Assistance Details supevision with SPC and min guard no AD    Ambulation  Distance (Feet) 180 Feet   180 without AD, and 150 with SPC   Ramp 5: Supervision    Ramp Details (indicate cue type and reason) with SPC    Curb 5: Supervision    Curb Details (indicate cue type and reason) with [facility]      High Level Balance   High Level Balance Comments in //bars with UE support for sidestepping and retrowalking 3 round trips, then intermit UE support as needed for rocker board 2 min A-P and 2 min lateral, then step  ups onto horizontal foam beam X10, and balance on foam beam horizontal for 1 min      Knee/Hip Exercises: Stretches   Active Hamstring Stretch Both;2 reps;30 seconds    Active Hamstring Stretch Limitations seated      Knee/Hip Exercises: Aerobic   Nustep L7 X 8 min UE/LE      Knee/Hip Exercises: Machines for Strengthening   Cybex Knee Extension bilat 10# 2X15    Cybex Knee Flexion bilat 35# 2X15    Total Gym Leg Press BLEs 106# 15 reps X 3 sets      Knee/Hip Exercises: Standing   Forward Step Up Both;10 reps;Step Height: 4";Hand Hold: 2   in bars                      PT Short Term Goals - 07/08/21 1656       PT SHORT TERM GOAL #1   Title Patient verbalizes adjusting ply socks for prosthetic fit.    Status Achieved    Target Date 07/09/21      PT SHORT TERM GOAL #2   Title Patient tolerates prosthesis >12 hrs total /day without increase in skin issues    Status Achieved    Target Date 07/09/21      PT SHORT TERM GOAL #3   Title Patient able to pick up item from floor without UE support with supervision.    Status Achieved    Target Date 07/09/21      PT SHORT TERM GOAL #4   Title Patient ambulates 200' with cane & prosthesis with supervision & no loss of balance.    Status Achieved    Target Date 07/09/21      PT SHORT TERM GOAL #5   Title Patient negotiates ramps & curbs with cane & prosthesis with min guard.    Status Achieved    Target Date 07/09/21               PT Long Term Goals - 05/18/21 1023       PT  LONG TERM GOAL #1   Title Patient demonstrates & verbalized understanding of prosthetic care to enable safe utilization of prosthesis.    Time 12    Period Weeks    Status On-going    Target Date 07/30/21      PT LONG TERM GOAL #2   Title Patient tolerates prosthesis wear >90% of awake hours without skin or limb pain issues.    Time 12    Period Weeks    Status On-going    Target Date 07/30/21      PT LONG TERM GOAL #3   Title Berg Balance >/= 36/56 to indicate lower fall risk    Time 12    Period Weeks    Status On-going    Target Date 07/30/21      PT LONG TERM GOAL #4   Title Patient ambulates 300' with LRAD & prosthesis modified independent    Time 12    Period Weeks    Status On-going    Target Date 07/30/21      PT LONG TERM GOAL #5   Title Patient negotiates ramps, curbs & stairs with LRAD & prosthesis modified independent.    Time 12    Period Weeks    Status On-going    Target Date 07/30/21                   Plan - 07/27/21 1554  Clinical Impression Statement He was not having the pain he was having last time so he was able to progress back to his standing program, and prosthethic ambulation today with good overall tolerance. He is becoming much more steady without his [facility] for ambulation and his wound is healing well on his proximal anterior tibia. He will need progress note next visit.    Personal Factors and Comorbidities Comorbidity 3+;Age;Time since onset of injury/illness/exacerbation    Comorbidities Rt TTA, arthritis, right hip replacement, left knee arthroplasty, shoulder sg X2, gout, basal cell CA, LBP, back sg X3, IDDM, neuropathy, diverticulosis, heart murmur, HTN, MI,    Examination-Activity Limitations Lift;Locomotion Level;Squat;Stairs;Stand;Transfers    Examination-Participation Restrictions Community Activity;Yard Work    [employer] Evolving/Moderate complexity    Rehab Potential Good    PT Frequency 2x / week     PT Duration 12 weeks    PT Treatment/Interventions ADLs/Self Care Home Management;DME Instruction;Gait training;Stair training;Functional mobility training;Therapeutic activities;Therapeutic exercise;Balance training;Neuromuscular re-education;Patient/family education;Prosthetic Training;Vestibular;Passive range of motion    PT Next Visit Plan check STG and needs progress note    Consulted and Agree with Plan of Care Patient             Patient will benefit from skilled therapeutic intervention in order to improve the following deficits and impairments:  Abnormal gait, Decreased activity tolerance, Decreased balance, Decreased endurance, Decreased knowledge of use of DME, Decreased mobility, Decreased range of motion, Decreased skin integrity, Decreased scar mobility, Decreased strength, Increased edema, Impaired flexibility, Postural dysfunction, Prosthetic Dependency  Visit Diagnosis: Other abnormalities of gait and mobility  Unsteadiness on feet  Stiffness of right knee, not elsewhere classified  Muscle weakness (generalized)  Abnormal posture     Problem List Patient Active Problem List   Diagnosis Date Noted   Exudative age-related macular degeneration of right eye with active choroidal neovascularization ([place]) 06/01/2021   Intermediate stage nonexudative age-related macular degeneration of both eyes 06/01/2021   Type 2 diabetes mellitus with diabetic polyneuropathy, with long-term current use of insulin (HCC) 03/24/2021   Hypokalemia    Constipation 02/02/2021   Fecal impaction ([place]) 02/01/2021   Wound dehiscence 01/09/2021   Dehiscence of amputation stump (HCC)    Status post percutaneous transluminal coronary angioplasty 01/06/2021   Type II diabetes mellitus, uncontrolled 01/06/2021   Acute pancreatitis 01/06/2021   Diabetic peripheral vascular disease ([place]) 01/06/2021   Encounter for screening for other disorder 01/06/2021   Enlarged prostate 01/06/2021   Foot  ulcer, right ([place]) 01/06/2021   Gout 01/06/2021   Headache 01/06/2021   Neck pain 01/06/2021   Hypoglycemia 01/06/2021   Loss of appetite 01/06/2021   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   Peripheral neuropathy 01/06/2021   Sciatica 01/06/2021   Vitamin B12 deficiency 01/06/2021   Vitamin D deficiency 01/06/2021   Weakness 01/06/2021   Diabetes mellitus type 2 with neurological manifestations ([place]) 01/06/2021   Protein-calorie malnutrition, severe 12/26/2020   Acute blood loss anemia 12/26/2020   Prerenal azotemia 12/26/2020   Below-knee amputation of right lower extremity ([place]) 12/12/2020   Gangrene of right foot ([place])    Diabetic neuropathy ([place]) 11/17/2020   Hyperglycemia due to type 2 diabetes mellitus ([place]) 11/17/2020   Long term (current) use of insulin ([place]) 11/17/2020   Obesity 11/17/2020   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/2019   Hip dislocation, right ([place]) 04/13/2019   Other intervertebral disc degeneration, lumbar region 03/30/2019   CAD (coronary artery disease) 01/30/2019   Tobacco abuse 01/30/2019  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   Burn, foot, second degree, left, initial encounter 06/08/2017   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 [place], [place], [address] Phone: [phone]   Fax:  [phone]  Name: Keith Espinoza MRN: [id number] Date of  Birth: 12-26-1940

## 2021-07-28 ENCOUNTER — Ambulatory Visit (INDEPENDENT_AMBULATORY_CARE_PROVIDER_SITE_OTHER): Payer: Medicare Other | Admitting: Ophthalmology

## 2021-07-28 ENCOUNTER — Ambulatory Visit (INDEPENDENT_AMBULATORY_CARE_PROVIDER_SITE_OTHER): Payer: Medicare Other | Admitting: Physical Therapy

## 2021-07-28 ENCOUNTER — Encounter (INDEPENDENT_AMBULATORY_CARE_PROVIDER_SITE_OTHER): Payer: Self-pay | Admitting: Ophthalmology

## 2021-07-28 ENCOUNTER — Encounter: Payer: Self-pay | Admitting: Physical Therapy

## 2021-07-28 ENCOUNTER — Other Ambulatory Visit: Payer: Self-pay | Admitting: *Deleted

## 2021-07-28 DIAGNOSIS — M25661 Stiffness of right knee, not elsewhere classified: Secondary | ICD-10-CM | POA: Diagnosis not present

## 2021-07-28 DIAGNOSIS — H353211 Exudative age-related macular degeneration, right eye, with active choroidal neovascularization: Secondary | ICD-10-CM | POA: Diagnosis not present

## 2021-07-28 DIAGNOSIS — H353221 Exudative age-related macular degeneration, left eye, with active choroidal neovascularization: Secondary | ICD-10-CM | POA: Diagnosis not present

## 2021-07-28 DIAGNOSIS — R2681 Unsteadiness on feet: Secondary | ICD-10-CM

## 2021-07-28 DIAGNOSIS — M6281 Muscle weakness (generalized): Secondary | ICD-10-CM | POA: Diagnosis not present

## 2021-07-28 DIAGNOSIS — L97909 Non-pressure chronic ulcer of unspecified part of unspecified lower leg with unspecified severity: Secondary | ICD-10-CM | POA: Diagnosis not present

## 2021-07-28 DIAGNOSIS — R2689 Other abnormalities of gait and mobility: Secondary | ICD-10-CM

## 2021-07-28 DIAGNOSIS — I70299 Other atherosclerosis of native arteries of extremities, unspecified extremity: Secondary | ICD-10-CM | POA: Diagnosis not present

## 2021-07-28 DIAGNOSIS — R293 Abnormal posture: Secondary | ICD-10-CM | POA: Diagnosis not present

## 2021-07-28 MED ORDER — BEVACIZUMAB 2.5 MG/0.1ML IZ SOSY
2.5000 mg | PREFILLED_SYRINGE | INTRAVITREAL | Status: AC | PRN
  Administered 2021-07-28: 2.5 mg via INTRAVITREAL

## 2021-07-28 NOTE — Assessment & Plan Note (Signed)
Improved macular findings OD at day 27.  We will need to schedule follow-up in 1 to 2 weeks

## 2021-07-28 NOTE — Assessment & Plan Note (Signed)
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  May account for some of his new visual symptoms in the left eye  Will need to commence intravitreal therapy today Avastin

## 2021-07-28 NOTE — Patient Outreach (Signed)
[facility]) Care Management  [place]  07/29/2021   Keith Espinoza [birth year] [id number]   Outgoing call placed to member.  Denies any urgent concerns, encouraged to contact this care manager with questions.   Encounter Medications:  Outpatient Encounter Medications as of 07/28/2021  Medication Sig Note   acetaminophen (TYLENOL) 500 MG tablet Take 1,000 mg by mouth every 6 (six) hours as needed (pain).    allopurinol (ZYLOPRIM) 300 MG tablet Take 300 mg by mouth daily.    amLODipine (NORVASC) 5 MG tablet Take 1 tablet (5 mg total) by mouth daily.    aspirin EC 81 MG tablet Take 81 mg by mouth daily.    Cholecalciferol (VITAMIN D3) 50 MCG (2000 UT) TABS Take 2,000 Units by mouth daily.     clopidogrel (PLAVIX) 75 MG tablet Take 1 tablet (75 mg total) by mouth daily.    Continuous Blood Gluc Receiver (DEXCOM G6 RECEIVER) DEVI 1 Device by Does not apply route as directed.    Continuous Blood Gluc Sensor (DEXCOM G6 SENSOR) MISC 1 Device by Does not apply route as directed.    Continuous Blood Gluc Transmit (DEXCOM G6 TRANSMITTER) MISC 1 Device by Does not apply route as directed.    Cyanocobalamin (B-12) 2500 MCG TABS Take 2,500 mcg by mouth daily.    empagliflozin (JARDIANCE) 25 MG TABS tablet Take 1 tablet (25 mg total) by mouth daily.    gabapentin (NEURONTIN) 100 MG capsule Take 2 capsules (200 mg total) by mouth at bedtime. (Patient taking differently: Take 600 mg by mouth at bedtime.) 01/10/2021: Bedtime dose is a total of 600 mg per pt   gabapentin (NEURONTIN) 400 MG capsule Take 1 capsule (400 mg total) by mouth 3 (three) times daily. (Patient taking differently: Take 400 mg by mouth daily.) 01/10/2021: Bedtime dose is a total of 600 mg per pt   hydrochlorothiazide (HYDRODIURIL) 12.5 MG tablet Take 1 tablet (12.5 mg total) by mouth daily.    insulin degludec (TRESIBA FLEXTOUCH) 100 UNIT/ML FlexTouch Pen Inject 9 Units into the skin daily.    Insulin Pen  Needle 32G X 4 MM MISC 1 Device by Does not apply route daily in the afternoon.    isosorbide mononitrate (IMDUR) 30 MG 24 hr tablet Take 1 tablet (30 mg total) by mouth daily. Please schedule yearly appointment for future refills. Thank you    linaclotide (LINZESS) 145 MCG CAPS capsule Take 1 capsule (145 mcg total) by mouth daily before breakfast.    losartan (COZAAR) 50 MG tablet Take 1 tablet (50 mg total) by mouth daily.    metFORMIN (GLUCOPHAGE) 1000 MG tablet Take 1 tablet (1,000 mg total) by mouth 2 (two) times daily.    methocarbamol (ROBAXIN) 500 MG tablet Take 1 tablet (500 mg total) by mouth every 6 (six) hours as needed for muscle spasms.    nitroGLYCERIN (NITROSTAT) 0.4 MG SL tablet Place 0.4 mg under the tongue every 5 (five) minutes x 3 doses as needed for chest pain.     ONE TOUCH ULTRA TEST test strip CHECK BLOOD SUGAR ONCE DAILY AS DIRECTED    oxyCODONE (OXY IR/ROXICODONE) 5 MG immediate release tablet Take 1 tablet (5 mg total) by mouth every 4 (four) hours as needed for severe pain.    polyethylene glycol (MIRALAX) 17 g packet Take 17 g by mouth 2 (two) times daily.    potassium chloride SA (KLOR-CON) 20 MEQ tablet Take 1 tablet (20 mEq total) by mouth daily.  senna-docusate (SENOKOT-S) 8.6-50 MG tablet Take 1 tablet by mouth at bedtime.    terazosin (HYTRIN) 5 MG capsule Take 1 capsule (5 mg total) by mouth at bedtime.    traMADol (ULTRAM) 50 MG tablet Take 1 tablet (50 mg total) by mouth every 6 (six) hours as needed for moderate pain.    traZODone (DESYREL) 50 MG tablet Take 1 tablet (50 mg total) by mouth at bedtime as needed for sleep. (Patient taking differently: Take 50 mg by mouth 2 (two) times daily.)    No facility-administered encounter medications on file as of 07/28/2021.    Functional Status:  In your present state of health, do you have any difficulty performing the following activities: 02/01/2021 01/09/2021  Hearing? N -  Vision? N -  Difficulty  concentrating or making decisions? N -  Walking or climbing stairs? Y -  Dressing or bathing? Y -  Doing errands, shopping? N N  Some recent data might be hidden    Fall/Depression Screening: Fall Risk  01/20/2021 11/24/2020 08/26/2020  Falls in the past year? 0 0 0  Number falls in past yr: 0 0 0  Injury with Fall? 0 0 -  Risk for fall due to : Impaired mobility Impaired mobility;Impaired balance/gait Impaired balance/gait;Impaired mobility  Follow up Education provided Falls evaluation completed Falls evaluation completed;Falls prevention discussed   PHQ 2/9 Scores 01/20/2021 10/12/2019  PHQ - 2 Score 0 0    Assessment:   Care Plan Care Plan : Diabetes Type 2 (Adult)  Updates made by Valente David, RN since 07/29/2021 12:00 AM     Problem: Glycemic Management (Diabetes, Type 2)   Priority: Medium  Onset Date: 08/26/2020     Long-Range Goal: Glycemic Management Optimized Completed 07/29/2021  Start Date: 06/30/2021  Expected End Date: 09/28/2021  Recent Progress: On track  Note:   Evidence-based guidance:  Anticipate A1C testing (point-of-care) every 3 to 6 months based on goal attainment.  Review mutually-set A1C goal or target range.  Anticipate use of antihyperglycemic with or without insulin and periodic adjustments; consider active involvement of pharmacist.  Provide medical nutrition therapy and development of individualized eating.  Compare self-reported symptoms of hypo or hyperglycemia to blood glucose levels, diet and fluid intake, current medications, psychosocial and physiologic stressors, change in activity and barriers to care adherence.  Promote self-monitoring of blood glucose levels.  Assess and address barriers to management plan, such as food insecurity, age, developmental ability, depression, anxiety, fear of hypoglycemia or weight gain, as well as medication cost, side effects and complicated regimen.  Consider referral to community-based diabetes education  program, visiting nurse, community health worker or health coach.  Encourage regular dental care for treatment of periodontal disease; refer to dental provider when needed.   Notes:   9/27 - Follow up with endocrinology today, goal reset  10/25 - Resolved due to duplicate goal    Task: Alleviate Barriers to Glycemic Management Completed 07/29/2021  Due Date: 09/28/2021  Note:   Care Management Activities:    - blood glucose monitoring encouraged - mutual A1C goal set or reviewed - self-awareness of signs/symptoms of hypo or hyperglycemia encouraged    Notes:     Care Plan : [place] (Adult)  Updates made by Valente David, RN since 07/29/2021 12:00 AM     Problem: knowledge deficit regarding chronic care management of chronic conditions (PVD, DM, HTN)   Priority: High     Long-Range Goal: Member will be able to verbalize  management of chronic conditions in the next 6 months   Start Date: 07/28/2021  Expected End Date: 01/25/2022  Priority: High  Note:   Current Barriers:  Knowledge Deficits related to plan of care for management of HTN, DMII, and BKA Chronic Disease Management support and education needs related to HTN, DMII, and BKA  RNCM Clinical Goal(s):  Patient will verbalize understanding of plan for management of HTN, DMII, and BKA take all medications exactly as prescribed and will call provider for medication related questions attend all scheduled medical appointments: Ortho on 11/7, [place] on 12/20.  work with Outpatient PT to increase stability on new prosthesis  through collaboration with Consulting civil engineer, provider, and care team.   10/25 - Patient report he will have initial consultation with [facility] in December.  Unsure if he will stop seeing Dr. Inda Merlin and have PCP at [facility] but will notify this [facility] after visit complete  Interventions: Inter-disciplinary care team collaboration (see longitudinal plan of care) Evaluation of current treatment plan related to  self  management and patient's adherence to plan as established by provider  Hypertension Interventions: Last practice recorded BP readings:  BP Readings from Last 3 Encounters:  06/30/21 118/72  03/24/21 (!) 124/55  02/18/21 107/66  Most recent eGFR/CrCl:  Lab Results  Component Value Date   EGFR 88 03/24/2021    No components found for: CRCL  Evaluation of current treatment plan related to hypertension self management and patient's adherence to plan as established by provider; Provided education to patient re: stroke prevention, s/s of heart attack and stroke; Reviewed medications with patient and discussed importance of compliance; Advised patient, providing education and rationale, to monitor blood pressure daily and record, calling PCP for findings outside established parameters;   BKANew goal. Evaluation of current treatment plan related to  BKA , ADL IADL limitations self-management and patient's adherence to plan as established by provider. Discussed plans with patient for ongoing care management follow up and provided patient with direct contact information for care management team Advised patient to continue therapy sessions with outpatient PT; Provided education to patient re: decreasing fall risk and use of prostethic; Reviewed scheduled/upcoming provider appointments including; Dr. Sharol Given 11/7  Diabetes Interventions: Assessed patient's understanding of A1c goal: <7% Provided education to patient about basic DM disease process; Reviewed medications with patient and discussed importance of medication adherence; Reviewed scheduled/upcoming provider appointments including: 1/3 with endocrinology; Advised patient, providing education and rationale, to check cbg daily and record, calling provider  for findings outside established parameters; Lab Results  Component Value Date   HGBA1C 7.5 (A) 06/30/2021   Patient Goals/Self-Care Activities: Patient will self administer medications  as prescribed Patient will attend all scheduled provider appointments Patient will continue to perform ADL's independently  Follow Up Plan:  Telephone follow up appointment with care management team member scheduled for:  11/22       Goals Addressed             This Visit's Progress    COMPLETED: (THN)THN Set My Target A1C       Timeframe:  Long-Range Goal Priority:  High Start Date:  [id number]                           Expected End Date:    [id number]                    Barriers: Knowledge  - set target A1C -  less than 7   Why is this important?   Your target A1C is decided together by you and your doctor.  It is based on several things like your age and other health issues.    Notes: 7.0  4/19 - Patient aware of proper diet to manage A1C as well as adherence to medication regime  5/10 - Education sent on managing diabetes and decreasing A1C  5/25 - Understand importance of managing diabetes in relation to wound care  6/24 - Report seen by endocrinology on 6/21, will revisit on 9/27.  Medication changes made, will continue to monitor blood sugars  7/25 - A1C currently 7.5, report understanding of DM management in effort to decrease to goal  8/19 - Continuing to monitor and manage blood sugars to decrease A1C to goal  9/27 - Will have A1C rechecked today during office visit with endocrinology  10/25 - Resolving due to duplicate goal        Plan:  Follow-up: Patient agrees to Care Plan and Follow-up. Follow-up in 1 month(s).  Valente David, [place], MSN [place] [phone]

## 2021-07-28 NOTE — Therapy (Signed)
[facility] [address] [place], [place], [address] Phone: [phone]   Fax:  [phone]  Physical Therapy Treatment, Recertification & Progress Note  Patient Details  Name: Keith Espinoza MRN: [id number] Date of Birth: 03-29-1941 Referring Provider (PT): Meridee Score, MD   Encounter Date: 07/28/2021  Progress Note Reporting Period 06/29/2021 to 07/28/2021  See note below for Objective Data and Assessment of Progress/Goals.       PT End of Session - 07/28/21 1511     Visit Number 20    Number of Visits 28    Date for PT Re-Evaluation 09/24/21    Authorization Type Medicare [employer] and [employer]    PT Start Time 1509    PT Stop Time 1554    PT Time Calculation (min) 45 min    Activity Tolerance Patient tolerated treatment well    Behavior During Therapy WFL for tasks assessed/performed             Past Medical History:  Diagnosis Date   Allergic rhinitis    Allergic rhinitis    Arthritis    Basal cell carcinoma 11/01/2019    bcc left chest treatment TX cx3 [id number]    Chronic leg pain    right   Chronic lower back pain    Coronary artery disease    a. Stenting to RCA 2004; staged DES to LAD and Cx 2004. DES to mRCA 2012. b. DES to mCx, PTCA to dCx 11/2011. c. Lateral wall MI 2013 s/p PTCA to distal Cx & DES to mid OM2 11/2011. d. Low risk nuc 04/2014, EF wnl.   COVID-19    Diabetes mellitus    Insulin dependent   Diabetic neuropathy ([place]    MILD   Diverticulosis    Dysrhythmia    GRosanna Randysyndrome    Gout    right wrist; right foot; right elbow; have had it since 1970's   H/O hiatal hernia    Heart murmur    History of echocardiogram    aortic sclerosis per echo 12/09 EF 65%, otherwise normal   History of hemorrhoids    BLEEDING   History of kidney stones    h/o   Hypertension    Diagnosed 1995    Myocardial infarction ([facility]    Pancreatic pseudocyst    a. s/p remote drainage 2006.   [place]rombocytopenia ([place]     Seen on oldest labs in system from 2004   Vitamin B 12 deficiency    orally replaced    Past Surgical History:  Procedure Laterality Date   ABDOMINAL AORTOGRAM W/LOWER EXTREMITY Bilateral 08/08/2020   Procedure: ABDOMINAL AORTOGRAM W/LOWER EXTREMITY;  Surgeon: DAngelia Mould MD;  [facility];  Service: Cardiovascular;  Laterality: Bilateral;   AMPUTATION Right 12/12/2020   Procedure: RIGHT BELOW KNEE AMPUTATION;  Surgeon: DNewt Minion MD;  [place]  Service: Orthopedics;  Laterality: Right;   BACK SURGERY     "total of 3 times" S/P fall    CARPAL TUNNEL RELEASE Bilateral    CHOLECYSTECTOMY  1990's   COLONOSCOPY     CORONARY ANGIOPLASTY  11/11/11   CORONARY ANGIOPLASTY WITH STENT PLACEMENT  09/30/2011   "1 then; makes a total of 4"   CORONARY ANGIOPLASTY WITH STENT PLACEMENT  11/11/11   "1; makes a total of 5"   INGUINAL HERNIA REPAIR  2003   right   JOINT REPLACEMENT Right 04/03/2002   hip replacment   KNEE ARTHROSCOPY  1990's   left   LEFT HEART CATHETERIZATION WITH CORONARY ANGIOGRAM N/A 09/30/2011   Procedure: LEFT HEART CATHETERIZATION WITH CORONARY ANGIOGRAM;  Surgeon: Jettie Booze, MD;  Location: [facility] CATH LAB;  Service: Cardiovascular;  Laterality: N/A;  possible PCI   LEFT HEART CATHETERIZATION WITH CORONARY ANGIOGRAM N/A 11/15/2011   Procedure: LEFT HEART CATHETERIZATION WITH CORONARY ANGIOGRAM;  Surgeon: Jettie Booze, MD;  Location: [facility] CATH LAB;  Service: Cardiovascular;  Laterality: N/A;   PERCUTANEOUS CORONARY STENT INTERVENTION (PCI-S)  09/30/2011   Procedure: PERCUTANEOUS CORONARY STENT INTERVENTION (PCI-S);  Surgeon: Jettie Booze, MD;  Location: [facility] CATH LAB;  Service: Cardiovascular;;   PERCUTANEOUS CORONARY STENT INTERVENTION (PCI-S) N/A 11/11/2011   Procedure: PERCUTANEOUS CORONARY STENT INTERVENTION (PCI-S);  Surgeon: Jettie Booze, MD;  Location: [facility] CATH LAB;  Service: Cardiovascular;  Laterality: N/A;    PERIPHERAL VASCULAR BALLOON ANGIOPLASTY Right 08/08/2020   Procedure: PERIPHERAL VASCULAR BALLOON ANGIOPLASTY;  Surgeon: Angelia Mould, MD;  Location: [place] CV LAB;  Service: Cardiovascular;  Laterality: Right;  Posterior tibial    SHOULDER SURGERY Right    X 2   STUMP REVISION Right 01/09/2021   Procedure: REVISION RIGHT BELOW KNEE AMPUTATION;  Surgeon: [name], MD;  Location: [place];  Service: Orthopedics;  Laterality: Right;   TONSILLECTOMY  ~ [place] Right 04/13/2019   Procedure: RIGHT TOTAL HIP REVISION-POSTERIOR  APPROACH LATERAL;  Surgeon: Marybelle Killings, MD;  Location: [place];  Service: Orthopedics;  Laterality: Right;   TOTAL KNEE ARTHROPLASTY Left 07/23/2016   Procedure: LEFT TOTAL KNEE ARTHROPLASTY;  Surgeon: Marybelle Killings, MD;  Location: [place];  Service: Orthopedics;  Laterality: Left;    There were no vitals filed for this visit.   Subjective Assessment - 07/28/21 1509     Subjective He saw eye doctor just prior to PT today with both eyes dilated which is impairing his balance.  He is wearing prosthesis most of awake hours & checks wound or shrinker dampness every ~3hrs.    Patient is accompained by: Family member   granddaughter   Pertinent History Rt TTA, arthritis, right hip replacement, left knee arthroplasty, shoulder sg X2, gout, basal cell CA, LBP, back sg X3, IDDM, neuropathy, diverticulosis, heart murmur, HTN, MI,    Limitations Standing;House hold activities;Walking    Patient Stated Goals walk in community & on farm, get on tractor, yard work, drive    Currently in Pain? No/denies    Pain Onset More than a month ago                [facility] PT Assessment - 07/28/21 1509       Assessment   Medical Diagnosis Right Transtibial Amputation    Referring Provider (PT) Meridee Score, MD    Onset Date/Surgical Date 05/05/21   prosthesis delivery   Hand Dominance Right      Transfers   Transfers Sit to Stand;Stand to Sit    Sit to  Stand 6: Modified independent (Device/Increase time);With upper extremity assist;From chair/3-in-1    Stand to Sit 6: Modified independent (Device/Increase time);With upper extremity assist;To chair/3-in-1      Ambulation/Gait   Ambulation/Gait Yes    Ambulation/Gait Assistance 6: Modified independent (Device/Increase time);5: Supervision   mod ind cane & supervision no AD   Ambulation Distance (Feet) 400 Feet   400' in 3.18mn,   Assistive device Straight cane;Prosthesis;None    Gait Pattern Step-through pattern;Decreased step length - right;Decreased stance time -  left;Decreased weight shift to right;Antalgic    Ambulation Surface Level;Indoor    Gait velocity 2.28 ft/sec   0.95   Ramp 5: Supervision   cane & TTA prosthesis   Curb 5: Supervision   cane & TTA prosthesis     Standardized Balance Assessment   Standardized Balance Assessment Berg Balance Test;Timed Up and Go Test      Berg Balance Test   Sit to Stand Able to stand  independently using hands    Standing Unsupported Able to stand safely 2 minutes    Sitting with Back Unsupported but Feet Supported on Floor or Stool Able to sit safely and securely 2 minutes    Stand to Sit Controls descent by using hands    Transfers Able to transfer safely, minor use of hands    Standing Unsupported with Eyes Closed Able to stand 10 seconds safely    Standing Unsupported with Feet Together Able to place feet together independently and stand 1 minute safely    From Standing, Reach Forward with Outstretched Arm Can reach forward >12 cm safely (5")    From Standing Position, Pick up Object from Floor Unable to pick up shoe, but reaches 2-5 cm (1-2") from shoe and balances independently    From Standing Position, Turn to Look Behind Over each Shoulder Turn sideways only but maintains balance    Turn 360 Degrees Needs close supervision or verbal cueing    Standing Unsupported, Alternately Place Feet on Step/Stool Needs assistance to keep from  falling or unable to try    Standing Unsupported, One Foot in Front Able to take small step independently and hold 30 seconds    Standing on One Leg Unable to try or needs assist to prevent fall    Total Score 36    Berg comment: initially was 16/56      Timed Up and Go Test   Normal TUG (seconds) 13.72    Cognitive TUG (seconds) 18.53   naming animals A-Z stopped naming with & after turn around            Prosthetics Assessment - 07/28/21 1509       [place] with Prosthetic cleaning;Correct ply sock adjustment;Proper wear schedule/adjustment;Proper weight-bearing schedule/adjustment    Prosthetic Care Dependent with Skin check;Residual limb care    Donning prosthesis  Modified independent (Device/Increase time)    Doffing prosthesis  Modified independent (Device/Increase time)    Current prosthetic wear tolerance (days/week)  daily    Current prosthetic wear tolerance (#hours/day)  most of awake hours    Current prosthetic weight-bearing tolerance (hours/day)  Pt tolerating weight bearing for 8-10 minutes without c/o pain or limb discomfort.    Edema none    Residual limb condition  ant tibia wound now 0.5cm X 1.0cm with scag.  no other open areas.  normal color, temperature & moisture.    Prosthesis Description silicon liner with shuttle pin lock system, secondary pelite liner, total contact socket, flexible keel foot.                          [facility] Adult PT Treatment/Exercise - 07/28/21 1509       Ambulation/Gait   Stairs Yes    Stairs Assistance 5: Supervision    Stair Management Technique One rail Right;With cane;Step to pattern;Forwards    Number of Stairs 11    Height of Stairs 7      Knee/Hip Exercises:  Machines for Strengthening   Cybex Knee Extension bilat 10# 2X15    Cybex Knee Flexion bilat 35# 2X15    Total Gym Leg Press BLEs 106# 15 reps X 2 sets and 162# 15 reps                       PT Short  Term Goals - 07/28/21 1600       PT SHORT TERM GOAL #1   Title Patient's wound is smaller than 0.5cm X 1.0cm with prosthetic wear most of awake hours.    Time 4    Period Weeks    Status New    Target Date 08/25/21      PT SHORT TERM GOAL #2   Title Patient able to ambulate with cane & prosthesis scanning & simple cognitive tasks with loss of balance & maintaining path.    Time 4    Period Weeks    Status New    Target Date 08/25/21      PT SHORT TERM GOAL #3   Title --    Status --    Target Date --      PT SHORT TERM GOAL #4   Title --    Status --    Target Date --      PT SHORT TERM GOAL #5   Title --    Status --    Target Date --               PT Long Term Goals - 07/28/21 1546       PT LONG TERM GOAL #1   Title Patient demonstrates & verbalized understanding of prosthetic care to enable safe utilization of prosthesis.    Time 12    Period Weeks    Status Partially Met      PT LONG TERM GOAL #2   Title Patient tolerates prosthesis wear >90% of awake hours without skin or limb pain issues.    Time 12    Period Weeks    Status Partially Met      PT LONG TERM GOAL #3   Title Berg Balance >/= 36/56 to indicate lower fall risk    Time 12    Period Weeks    Status Achieved      PT LONG TERM GOAL #4   Title Patient ambulates 300' with LRAD & prosthesis modified independent    Time 12    Period Weeks    Status Achieved      PT LONG TERM GOAL #5   Title Patient negotiates ramps, curbs & stairs with LRAD & prosthesis modified independent.    Time 12    Period Weeks    Status Not Met                PT Long Term Goals - 07/28/21 1607       PT LONG TERM GOAL #1   Title Patient demonstrates & verbalized understanding of prosthetic care to enable safe utilization of prosthesis.    Time 8    Period Weeks    Status On-going    Target Date 09/24/21      PT LONG TERM GOAL #2   Title Patient tolerates prosthesis wear >90% of awake hours  without skin or limb pain issues.    Time 8    Period Weeks    Status On-going    Target Date 09/24/21      PT LONG TERM GOAL #3  Title [employer] >/= 45/56 to indicate lower fall risk    Time 8    Period Weeks    Status Revised    Target Date 09/24/21      PT LONG TERM GOAL #4   Title Patient ambulates 500' including uneven terrain with cane & prosthesis modified independent    Time 8    Period Weeks    Status Revised    Target Date 09/24/21      PT LONG TERM GOAL #5   Title Patient negotiates ramps, curbs & stairs with cane & prosthesis modified independent.    Time 8    Period Weeks    Status On-going    Target Date 09/24/21                 Plan - 07/28/21 1512     Clinical Impression Statement Patient's wound is healing with use of Vivewear shrinker under prosthetic liner but needs further PT with prosthetic training to monitor healing with prosthesis use.  His Berg Balance score improved to 36/56 from 16/56 but still indicates high fall risk.  He is ambulating up to 400' with cane & prosthesis safely on level surfaces but has difficulty with balance on compliant / uneven surfaces. He needs supervision for safety with cane to negotiate ramps, curbs & stairs single rail. Pt would benefit from additional skilled care to further improve safe wear / use of prosthesis, balance & gait.    Personal Factors and Comorbidities Comorbidity 3+;Age;Time since onset of injury/illness/exacerbation    Comorbidities Rt TTA, arthritis, right hip replacement, left knee arthroplasty, shoulder sg X2, gout, basal cell CA, LBP, back sg X3, IDDM, neuropathy, diverticulosis, heart murmur, HTN, MI,    Examination-Activity Limitations Lift;Locomotion Level;Squat;Stairs;Stand;Transfers    Examination-Participation Restrictions Community Activity;Yard Work    [employer] Evolving/Moderate complexity    Rehab Potential Good    PT Frequency 1x / week    PT Duration 8  weeks    PT Treatment/Interventions ADLs/Self Care Home Management;DME Instruction;Gait training;Stair training;Functional mobility training;Therapeutic activities;Therapeutic exercise;Balance training;Neuromuscular re-education;Patient/family education;Prosthetic Training;Vestibular;Passive range of motion    PT Next Visit Plan update HEP, work on balance, strength & gait with TTA prosthesis, check wound    Consulted and Agree with Plan of Care Patient             Patient will benefit from skilled therapeutic intervention in order to improve the following deficits and impairments:  Abnormal gait, Decreased activity tolerance, Decreased balance, Decreased endurance, Decreased knowledge of use of DME, Decreased mobility, Decreased range of motion, Decreased skin integrity, Decreased scar mobility, Decreased strength, Increased edema, Impaired flexibility, Postural dysfunction, Prosthetic Dependency  Visit Diagnosis: Other abnormalities of gait and mobility  Stiffness of right knee, not elsewhere classified  Muscle weakness (generalized)  Unsteadiness on feet  Abnormal posture     Problem List Patient Active Problem List   Diagnosis Date Noted   Exudative age-related macular degeneration of left eye with active choroidal neovascularization ([place]) 07/28/2021   Exudative age-related macular degeneration of right eye with active choroidal neovascularization ([place]) 06/01/2021   Intermediate stage nonexudative age-related macular degeneration of both eyes 06/01/2021   Type 2 diabetes mellitus with diabetic polyneuropathy, with long-term current use of insulin (HCC) 03/24/2021   Hypokalemia    Constipation 02/02/2021   Fecal impaction ([place]) 02/01/2021   Wound dehiscence 01/09/2021   Dehiscence of amputation stump (HCC)    Status post percutaneous transluminal coronary angioplasty 01/06/2021   Type  II diabetes mellitus, uncontrolled 01/06/2021   Acute pancreatitis 01/06/2021   Diabetic  peripheral vascular disease ([place]) 01/06/2021   Encounter for screening for other disorder 01/06/2021   Enlarged prostate 01/06/2021   Foot ulcer, right ([place]) 01/06/2021   Gout 01/06/2021   Headache 01/06/2021   Neck pain 01/06/2021   Hypoglycemia 01/06/2021   Loss of appetite 01/06/2021   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   Peripheral neuropathy 01/06/2021   Sciatica 01/06/2021   Vitamin B12 deficiency 01/06/2021   Vitamin D deficiency 01/06/2021   Weakness 01/06/2021   Diabetes mellitus type 2 with neurological manifestations ([place]) 01/06/2021   Protein-calorie malnutrition, severe 12/26/2020   Acute blood loss anemia 12/26/2020   Prerenal azotemia 12/26/2020   Below-knee amputation of right lower extremity ([place]) 12/12/2020   Gangrene of right foot ([place])    Diabetic neuropathy ([place]) 11/17/2020   Hyperglycemia due to type 2 diabetes mellitus ([place]) 11/17/2020   Long term (current) use of insulin ([place]) 11/17/2020   Obesity 11/17/2020   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/2019   Hip dislocation, right ([place]) 04/13/2019   Other intervertebral disc degeneration, lumbar region 03/30/2019   CAD (coronary artery disease) 01/30/2019   Tobacco abuse 01/30/2019   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   Burn, foot, second degree, left, initial encounter 06/08/2017   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 03/16/2017   S/P total knee arthroplasty, left 10/26/2016   Hyperlipidemia 09/04/2014   Thrombocytopenia (HCC)    Precordial chest pain 04/05/2014   Coronary atherosclerosis of native coronary artery 10/01/2013   Other and unspecified hyperlipidemia 10/01/2013   Primary hypertension 10/01/2013   Diabetes mellitus ([place]) 10/01/2013   Esophageal reflux 10/01/2013   Hypertrophy of prostate without urinary obstruction and other lower urinary tract symptoms (LUTS) 10/01/2013    Jamey Reas, PT, DPT 07/28/2021, 4:04 PM  [facility] Physical  Therapy [address] [place], [place], [address] Phone: [phone]   Fax:  [phone]  Name: Keith Espinoza MRN: [id number] Date of Birth: 1941-04-03

## 2021-07-28 NOTE — Progress Notes (Signed)
07/28/2021     CHIEF COMPLAINT Patient presents for  Chief Complaint  Patient presents with   Retina Follow Up      HISTORY OF PRESENT ILLNESS: Keith Espinoza is a 80 y.o. male who presents to the clinic today for:   HPI     Retina Follow Up   Patient presents with  Wet AMD.  In right eye.  This started 5 weeks ago.  Severity is mild.  Duration of 5 weeks.  Since onset it is gradually worsening.        Comments   5 week fu OD oct avastin OD. Patient states vision is stable and unchanged since last visit. Denies any new floaters or FOL.       Last edited by Laurin Coder on 07/28/2021  1:13 PM.      Referring physician: [name], MD [address] [place],   [id number]  HISTORICAL INFORMATION:   Selected notes from the MEDICAL RECORD NUMBER    Lab Results  Component Value Date   HGBA1C 7.5 (A) 06/30/2021     CURRENT MEDICATIONS: No current outpatient medications on file. (Ophthalmic Drugs)   No current facility-administered medications for this visit. (Ophthalmic Drugs)   Current Outpatient Medications (Other)  Medication Sig   acetaminophen (TYLENOL) 500 MG tablet Take 1,000 mg by mouth every 6 (six) hours as needed (pain).   allopurinol (ZYLOPRIM) 300 MG tablet Take 300 mg by mouth daily.   amLODipine (NORVASC) 5 MG tablet Take 1 tablet (5 mg total) by mouth daily.   aspirin EC 81 MG tablet Take 81 mg by mouth daily.   Cholecalciferol (VITAMIN D3) 50 MCG (2000 UT) TABS Take 2,000 Units by mouth daily.    clopidogrel (PLAVIX) 75 MG tablet Take 1 tablet (75 mg total) by mouth daily.   Continuous Blood Gluc Receiver (DEXCOM G6 RECEIVER) DEVI 1 Device by Does not apply route as directed.   Continuous Blood Gluc Sensor (DEXCOM G6 SENSOR) MISC 1 Device by Does not apply route as directed.   Continuous Blood Gluc Transmit (DEXCOM G6 TRANSMITTER) MISC 1 Device by Does not apply route as directed.   Cyanocobalamin (B-12) 2500 MCG  TABS Take 2,500 mcg by mouth daily.   empagliflozin (JARDIANCE) 25 MG TABS tablet Take 1 tablet (25 mg total) by mouth daily.   gabapentin (NEURONTIN) 100 MG capsule Take 2 capsules (200 mg total) by mouth at bedtime. (Patient taking differently: Take 600 mg by mouth at bedtime.)   gabapentin (NEURONTIN) 400 MG capsule Take 1 capsule (400 mg total) by mouth 3 (three) times daily. (Patient taking differently: Take 400 mg by mouth daily.)   hydrochlorothiazide (HYDRODIURIL) 12.5 MG tablet Take 1 tablet (12.5 mg total) by mouth daily.   insulin degludec (TRESIBA FLEXTOUCH) 100 UNIT/ML FlexTouch Pen Inject 9 Units into the skin daily.   Insulin Pen Needle 32G X 4 MM MISC 1 Device by Does not apply route daily in the afternoon.   isosorbide mononitrate (IMDUR) 30 MG 24 hr tablet Take 1 tablet (30 mg total) by mouth daily. Please schedule yearly appointment for future refills. Thank you   linaclotide (LINZESS) 145 MCG CAPS capsule Take 1 capsule (145 mcg total) by mouth daily before breakfast.   losartan (COZAAR) 50 MG tablet Take 1 tablet (50 mg total) by mouth daily.   metFORMIN (GLUCOPHAGE) 1000 MG tablet Take 1 tablet (1,000 mg total) by mouth 2 (two) times daily.   methocarbamol (ROBAXIN)  500 MG tablet Take 1 tablet (500 mg total) by mouth every 6 (six) hours as needed for muscle spasms.   nitroGLYCERIN (NITROSTAT) 0.4 MG SL tablet Place 0.4 mg under the tongue every 5 (five) minutes x 3 doses as needed for chest pain.    ONE TOUCH ULTRA TEST test strip CHECK BLOOD SUGAR ONCE DAILY AS DIRECTED   oxyCODONE (OXY IR/ROXICODONE) 5 MG immediate release tablet Take 1 tablet (5 mg total) by mouth every 4 (four) hours as needed for severe pain.   polyethylene glycol (MIRALAX) 17 g packet Take 17 g by mouth 2 (two) times daily.   potassium chloride SA (KLOR-CON) 20 MEQ tablet Take 1 tablet (20 mEq total) by mouth daily.   senna-docusate (SENOKOT-S) 8.6-50 MG tablet Take 1 tablet by mouth at bedtime.    terazosin (HYTRIN) 5 MG capsule Take 1 capsule (5 mg total) by mouth at bedtime.   traMADol (ULTRAM) 50 MG tablet Take 1 tablet (50 mg total) by mouth every 6 (six) hours as needed for moderate pain.   traZODone (DESYREL) 50 MG tablet Take 1 tablet (50 mg total) by mouth at bedtime as needed for sleep. (Patient taking differently: Take 50 mg by mouth 2 (two) times daily.)   No current facility-administered medications for this visit. (Other)      REVIEW OF SYSTEMS:    ALLERGIES Allergies  Allergen Reactions   Simvastatin Other (See Comments)    SEVERE MYALGIAS    Zetia [Ezetimibe] Other (See Comments)    MYALGIAS   Dilaudid [Hydromorphone Hcl] Other (See Comments)    hallucination    PAST MEDICAL HISTORY Past Medical History:  Diagnosis Date   Allergic rhinitis    Allergic rhinitis    Arthritis    Basal cell carcinoma 11/01/2019    bcc left chest treatment TX cx3 20fu    Chronic leg pain    right   Chronic lower back pain    Coronary artery disease    a. Stenting to RCA 2004; staged DES to LAD and Cx 2004. DES to mRCA 2012. b. DES to mCx, PTCA to dCx 11/2011. c. Lateral wall MI 2013 s/p PTCA to distal Cx & DES to mid OM2 11/2011. d. Low risk nuc 04/2014, EF wnl.   COVID-19    Diabetes mellitus    Insulin dependent   Diabetic neuropathy ([place])    MILD   Diverticulosis    Dysrhythmia    [name] syndrome    Gout    right wrist; right foot; right elbow; have had it since 1970's   H/O hiatal hernia    Heart murmur    History of echocardiogram    aortic sclerosis per echo 12/09 EF 65%, otherwise normal   History of hemorrhoids    BLEEDING   History of kidney stones    h/o   Hypertension    Diagnosed 1995    Myocardial infarction [facility])    Pancreatic pseudocyst    a. s/p remote drainage 2006.   Thrombocytopenia ([place])    Seen on oldest labs in system from 2004   Vitamin B 12 deficiency    orally replaced   Past Surgical History:  Procedure Laterality Date    ABDOMINAL AORTOGRAM W/LOWER EXTREMITY Bilateral 08/08/2020   Procedure: ABDOMINAL AORTOGRAM W/LOWER EXTREMITY;  Surgeon: Angelia Mould, MD;  Location: [facility];  Service: Cardiovascular;  Laterality: Bilateral;   AMPUTATION Right 12/12/2020   Procedure: RIGHT BELOW KNEE AMPUTATION;  Surgeon: [name],  MD;  Location: [place];  Service: Orthopedics;  Laterality: Right;   BACK SURGERY     "total of 3 times" S/P fall    CARPAL TUNNEL RELEASE Bilateral    CHOLECYSTECTOMY  1990's   COLONOSCOPY     CORONARY ANGIOPLASTY  11/11/11   CORONARY ANGIOPLASTY WITH STENT PLACEMENT  09/30/2011   "1 then; makes a total of 4"   CORONARY ANGIOPLASTY WITH STENT PLACEMENT  11/11/11   "1; makes a total of 5"   INGUINAL HERNIA REPAIR  2003   right   JOINT REPLACEMENT Right 04/03/2002   hip replacment   KNEE ARTHROSCOPY  1990's   left   LEFT HEART CATHETERIZATION WITH CORONARY ANGIOGRAM N/A 09/30/2011   Procedure: LEFT HEART CATHETERIZATION WITH CORONARY ANGIOGRAM;  Surgeon: Jettie Booze, MD;  Location: [facility] CATH LAB;  Service: Cardiovascular;  Laterality: N/A;  possible PCI   LEFT HEART CATHETERIZATION WITH CORONARY ANGIOGRAM N/A 11/15/2011   Procedure: LEFT HEART CATHETERIZATION WITH CORONARY ANGIOGRAM;  Surgeon: Jettie Booze, MD;  Location: [facility] CATH LAB;  Service: Cardiovascular;  Laterality: N/A;   PERCUTANEOUS CORONARY STENT INTERVENTION (PCI-S)  09/30/2011   Procedure: PERCUTANEOUS CORONARY STENT INTERVENTION (PCI-S);  Surgeon: Jettie Booze, MD;  Location: [facility] CATH LAB;  Service: Cardiovascular;;   PERCUTANEOUS CORONARY STENT INTERVENTION (PCI-S) N/A 11/11/2011   Procedure: PERCUTANEOUS CORONARY STENT INTERVENTION (PCI-S);  Surgeon: Jettie Booze, MD;  Location: [facility] CATH LAB;  Service: Cardiovascular;  Laterality: N/A;   PERIPHERAL VASCULAR BALLOON ANGIOPLASTY Right 08/08/2020   Procedure: PERIPHERAL VASCULAR BALLOON ANGIOPLASTY;  Surgeon: Angelia Mould, MD;   Location: [facility];  Service: Cardiovascular;  Laterality: Right;  Posterior tibial    SHOULDER SURGERY Right    X 2   STUMP REVISION Right 01/09/2021   Procedure: REVISION RIGHT BELOW KNEE AMPUTATION;  Surgeon: [name], MD;  Location: [place];  Service: Orthopedics;  Laterality: Right;   TONSILLECTOMY  ~ [place] Right 04/13/2019   Procedure: RIGHT TOTAL HIP REVISION-POSTERIOR  APPROACH LATERAL;  Surgeon: Marybelle Killings, MD;  Location: [place];  Service: Orthopedics;  Laterality: Right;   TOTAL KNEE ARTHROPLASTY Left 07/23/2016   Procedure: LEFT TOTAL KNEE ARTHROPLASTY;  Surgeon: Marybelle Killings, MD;  Location: [place];  Service: Orthopedics;  Laterality: Left;    FAMILY HISTORY Family History  Problem Relation Age of Onset   Diabetes Mother    Hyperlipidemia Mother    Hypertension Mother    Cancer Father    Hypertension Father    Diabetes Sister    Hypertension Sister    Cancer Brother    Heart attack Neg Hx     SOCIAL HISTORY Social History   Tobacco Use   Smoking status: Former   Smokeless tobacco: Current    Types: Chew   Tobacco comments:    quit 60 years ago  Vaping Use   Vaping Use: Never used  Substance Use Topics   Alcohol use: No   Drug use: No         OPHTHALMIC EXAM:  Base Eye Exam     Visual Acuity (ETDRS)       Right Left   Dist cc 20/25 -1 20/40 -1   Dist ph cc  NI    Correction: Glasses         Tonometry (Tonopen, 1:19 PM)       Right Left   Pressure 11 12  Pupils       Pupils Dark Light Shape React APD   Right PERRL 3 2 Round Brisk None   Left PERRL 3 2 Round Brisk None         Visual Fields       Left Right    Full Full         Extraocular Movement       Right Left    Full Full         Neuro/Psych     Oriented x3: Yes   Mood/Affect: Normal         Dilation     Both eyes: 1.0% Mydriacyl, 2.5% Phenylephrine @ 1:19 PM           Slit Lamp and Fundus Exam      External Exam       Right Left   External Normal Normal         Slit Lamp Exam       Right Left   Lids/Lashes Normal Normal   Conjunctiva/Sclera White and quiet White and quiet   Cornea Clear Clear   Anterior Chamber Deep and quiet Deep and quiet   Iris Round and reactive Round and reactive   Lens Centered posterior chamber intraocular lens Centered posterior chamber intraocular lens   Anterior Vitreous Normal Normal         Fundus Exam       Right Left   Posterior Vitreous Normal Normal   Disc Normal Normal   C/D Ratio 2.5 2.5   Macula Retinal pigment epithelial mottling, Intermediate age related macular degeneration Retinal pigment epithelial mottling, Intermediate age related macular degeneration   Vessels Normal, no DR Normal, no DR   Periphery Normal Normal            IMAGING AND PROCEDURES  Imaging and Procedures for 07/28/21  OCT, Retina - OU - Both Eyes       Right Eye Quality was borderline. Scan locations included subfoveal. Central Foveal Thickness: 266. Progression has worsened. Findings include abnormal foveal contour, subretinal scarring, inner retinal atrophy, intraretinal hyper-reflective material, intraretinal fluid.   Left Eye Quality was borderline. Scan locations included subfoveal. Central Foveal Thickness: 245. Progression has been stable. Findings include abnormal foveal contour, retinal drusen , intraretinal hyper-reflective material, intraretinal fluid.   Notes OD with      Intravitreal Injection, Pharmacologic Agent - OS - Left Eye       Time Out 07/28/2021. 2:01 PM. Confirmed correct patient, procedure, site, and patient consented.   Anesthesia Topical anesthesia was used. Anesthetic medications included Lidocaine 4%.   Procedure Preparation included 5% betadine to ocular surface, 10% betadine to eyelids, Tobramycin 0.3%. A 30 gauge needle was used.   Injection: 2.5 mg bevacizumab 2.5 MG/0.1ML   Route: Intravitreal, Site:  Left Eye   NDC: [phone], Lot: [id number]   Post-op Post injection exam found visual acuity of at least counting fingers. The patient tolerated the procedure well. There were no complications. The patient received written and verbal post procedure care education. Post injection medications included ocuflox.              ASSESSMENT/PLAN:  Exudative age-related macular degeneration of right eye with active choroidal neovascularization (HCC) Improved macular findings OD at day 27.  We will need to schedule follow-up in 1 to 2 weeks  Exudative age-related macular degeneration of left eye with active choroidal neovascularization (HCC) OS with new onset juxta foveal CNVM with intraretinal fluid noted.  May account for some of his new visual symptoms in the left eye  Will need to commence intravitreal therapy today Avastin     ICD-10-CM   1. Exudative age-related macular degeneration of left eye with active choroidal neovascularization (HCC)  H35.3221 Intravitreal Injection, Pharmacologic Agent - OS - Left Eye    bevacizumab (AVASTIN) SOSY 2.5 mg    2. Exudative age-related macular degeneration of right eye with active choroidal neovascularization (HCC)  H35.3211 OCT, Retina - OU - Both Eyes    CANCELED: Intravitreal Injection, Pharmacologic Agent - OD - Right Eye      1.  OS with new onset CNVM inferior to FAZ associated with little change in vision.  We will commence treatment left eye today  2.  OD, at day 27 postinjection we will need to repeat evaluation OD next week  3.  Ophthalmic Meds Ordered this visit:  Meds ordered this encounter  Medications   bevacizumab (AVASTIN) SOSY 2.5 mg       Return in about 1 week (around 08/04/2021) for dilate, OD, AVASTIN OCT,,, dilate OS Avastin OCT 6 weeks.  There are no Patient Instructions on file for this visit.   Explained the diagnoses, plan, and follow up with the patient and they expressed understanding.  Patient expressed  understanding of the importance of proper follow up care.   Clent Demark Krishika Bugge M.D. Diseases & Surgery of the Retina and Vitreous Retina & Diabetic Comunas 07/28/21     Abbreviations: M myopia (nearsighted); A astigmatism; H hyperopia (farsighted); P presbyopia; Mrx spectacle prescription;  CTL contact lenses; OD right eye; OS left eye; OU both eyes  XT exotropia; ET esotropia; PEK punctate epithelial keratitis; PEE punctate epithelial erosions; DES dry eye syndrome; MGD meibomian gland dysfunction; ATs artificial tears; PFAT's preservative free artificial tears; [place] nuclear sclerotic cataract; PSC posterior subcapsular cataract; ERM epi-retinal membrane; PVD posterior vitreous detachment; RD retinal detachment; DM diabetes mellitus; DR diabetic retinopathy; NPDR non-proliferative diabetic retinopathy; PDR proliferative diabetic retinopathy; CSME clinically significant macular edema; DME diabetic macular edema; dbh dot blot hemorrhages; CWS cotton wool spot; POAG primary open angle glaucoma; C/D cup-to-disc ratio; HVF humphrey visual field; GVF goldmann visual field; OCT optical coherence tomography; IOP intraocular pressure; BRVO Branch retinal vein occlusion; CRVO central retinal vein occlusion; CRAO central retinal artery occlusion; BRAO branch retinal artery occlusion; RT retinal tear; SB scleral buckle; PPV pars plana vitrectomy; VH Vitreous hemorrhage; PRP panretinal laser photocoagulation; IVK intravitreal kenalog; VMT vitreomacular traction; MH Macular hole;  NVD neovascularization of the disc; NVE neovascularization elsewhere; AREDS age related eye disease study; ARMD age related macular degeneration; POAG primary open angle glaucoma; EBMD epithelial/anterior basement membrane dystrophy; ACIOL anterior chamber intraocular lens; IOL intraocular lens; PCIOL posterior chamber intraocular lens; Phaco/IOL phacoemulsification with intraocular lens placement; [place] photorefractive keratectomy; LASIK laser  assisted in situ keratomileusis; HTN hypertension; DM diabetes mellitus; COPD chronic obstructive pulmonary disease

## 2021-07-30 ENCOUNTER — Encounter (INDEPENDENT_AMBULATORY_CARE_PROVIDER_SITE_OTHER): Payer: Medicare Other | Admitting: Ophthalmology

## 2021-08-04 ENCOUNTER — Encounter (INDEPENDENT_AMBULATORY_CARE_PROVIDER_SITE_OTHER): Payer: Medicare Other | Admitting: Ophthalmology

## 2021-08-05 ENCOUNTER — Encounter: Payer: Medicare Other | Admitting: Physical Therapy

## 2021-08-06 ENCOUNTER — Ambulatory Visit (INDEPENDENT_AMBULATORY_CARE_PROVIDER_SITE_OTHER): Payer: Medicare Other | Admitting: Ophthalmology

## 2021-08-06 ENCOUNTER — Encounter: Payer: Self-pay | Admitting: Physical Therapy

## 2021-08-06 ENCOUNTER — Ambulatory Visit (INDEPENDENT_AMBULATORY_CARE_PROVIDER_SITE_OTHER): Payer: Medicare Other | Admitting: Physical Therapy

## 2021-08-06 ENCOUNTER — Other Ambulatory Visit: Payer: Self-pay

## 2021-08-06 ENCOUNTER — Ambulatory Visit: Payer: Medicare Other | Admitting: Orthopedic Surgery

## 2021-08-06 ENCOUNTER — Encounter (INDEPENDENT_AMBULATORY_CARE_PROVIDER_SITE_OTHER): Payer: Self-pay | Admitting: Ophthalmology

## 2021-08-06 DIAGNOSIS — H353221 Exudative age-related macular degeneration, left eye, with active choroidal neovascularization: Secondary | ICD-10-CM | POA: Diagnosis not present

## 2021-08-06 DIAGNOSIS — R293 Abnormal posture: Secondary | ICD-10-CM

## 2021-08-06 DIAGNOSIS — R2689 Other abnormalities of gait and mobility: Secondary | ICD-10-CM

## 2021-08-06 DIAGNOSIS — R2681 Unsteadiness on feet: Secondary | ICD-10-CM

## 2021-08-06 DIAGNOSIS — H353211 Exudative age-related macular degeneration, right eye, with active choroidal neovascularization: Secondary | ICD-10-CM | POA: Diagnosis not present

## 2021-08-06 DIAGNOSIS — M25661 Stiffness of right knee, not elsewhere classified: Secondary | ICD-10-CM | POA: Diagnosis not present

## 2021-08-06 DIAGNOSIS — M6281 Muscle weakness (generalized): Secondary | ICD-10-CM

## 2021-08-06 MED ORDER — BEVACIZUMAB 2.5 MG/0.1ML IZ SOSY
2.5000 mg | PREFILLED_SYRINGE | INTRAVITREAL | Status: AC | PRN
  Administered 2021-08-06: 2.5 mg via INTRAVITREAL

## 2021-08-06 NOTE — Assessment & Plan Note (Signed)
1 week post recent injection OS, follow-up as scheduled

## 2021-08-06 NOTE — Therapy (Signed)
[facility] [address] [place], [place], [address] Phone: [phone]   Fax:  [phone]  Physical Therapy Treatment  Patient Details  Name: Keith Espinoza MRN: [id number] Date of Birth: [birth year] Referring Provider (PT): Meridee Score, MD   Encounter Date: 08/06/2021   PT End of Session - 08/06/21 1640     Visit Number 21    Number of Visits 28    Date for PT Re-Evaluation 09/24/21    Authorization Type Medicare A[employer] and [employer]    Progress Note Due on Visit 30    PT Start Time 1555    PT Stop Time 1643    PT Time Calculation (min) 48 min    Activity Tolerance Patient tolerated treatment well    Behavior During Therapy [facility] for tasks assessed/performed             Past Medical History:  Diagnosis Date   Allergic rhinitis    Allergic rhinitis    Arthritis    Basal cell carcinoma 11/01/2019    bcc left chest treatment TX cx3 72fu    Chronic leg pain    right   Chronic lower back pain    Coronary artery disease    a. Stenting to RCA 2004; staged DES to LAD and Cx 2004. DES to mRCA 2012. b. DES to mCx, PTCA to dCx 11/2011. c. Lateral wall MI 2013 s/p PTCA to distal Cx & DES to mid OM2 11/2011. d. Low risk nuc 04/2014, EF wnl.   COVID-19    Diabetes mellitus    Insulin dependent   Diabetic neuropathy ([place])    MILD   Diverticulosis    Dysrhythmia    [name] syndrome    Gout    right wrist; right foot; right elbow; have had it since 1970's   H/O hiatal hernia    Heart murmur    History of echocardiogram    aortic sclerosis per echo 12/09 EF 65%, otherwise normal   History of hemorrhoids    BLEEDING   History of kidney stones    h/o   Hypertension    Diagnosed 1995    Myocardial infarction [facility])    Pancreatic pseudocyst    a. s/p remote drainage 2006.   Thrombocytopenia ([place])    Seen on oldest labs in system from 2004   Vitamin B 12 deficiency    orally replaced    Past Surgical History:  Procedure Laterality  Date   ABDOMINAL AORTOGRAM W/LOWER EXTREMITY Bilateral 08/08/2020   Procedure: ABDOMINAL AORTOGRAM W/LOWER EXTREMITY;  Surgeon: Angelia Mould, MD;  Location: [place] [facility];  Service: Cardiovascular;  Laterality: Bilateral;   AMPUTATION Right 12/12/2020   Procedure: RIGHT BELOW KNEE AMPUTATION;  Surgeon: [name], MD;  Location: [place];  Service: Orthopedics;  Laterality: Right;   BACK SURGERY     "total of 3 times" S/P fall    CARPAL TUNNEL RELEASE Bilateral    CHOLECYSTECTOMY  1990's   COLONOSCOPY     CORONARY ANGIOPLASTY  11/11/11   CORONARY ANGIOPLASTY WITH STENT PLACEMENT  09/30/2011   "1 then; makes a total of 4"   CORONARY ANGIOPLASTY WITH STENT PLACEMENT  11/11/11   "1; makes a total of 5"   INGUINAL HERNIA REPAIR  2003   right   JOINT REPLACEMENT Right 04/03/2002   hip replacment   KNEE ARTHROSCOPY  1990's   left   LEFT HEART CATHETERIZATION WITH CORONARY ANGIOGRAM N/A 09/30/2011   Procedure: LEFT  HEART CATHETERIZATION WITH CORONARY ANGIOGRAM;  Surgeon: Jettie Booze, MD;  Location: [facility] CATH LAB;  Service: Cardiovascular;  Laterality: N/A;  possible PCI   LEFT HEART CATHETERIZATION WITH CORONARY ANGIOGRAM N/A 11/15/2011   Procedure: LEFT HEART CATHETERIZATION WITH CORONARY ANGIOGRAM;  Surgeon: Jettie Booze, MD;  Location: [facility] CATH LAB;  Service: Cardiovascular;  Laterality: N/A;   PERCUTANEOUS CORONARY STENT INTERVENTION (PCI-S)  09/30/2011   Procedure: PERCUTANEOUS CORONARY STENT INTERVENTION (PCI-S);  Surgeon: Jettie Booze, MD;  Location: [facility] CATH LAB;  Service: Cardiovascular;;   PERCUTANEOUS CORONARY STENT INTERVENTION (PCI-S) N/A 11/11/2011   Procedure: PERCUTANEOUS CORONARY STENT INTERVENTION (PCI-S);  Surgeon: Jettie Booze, MD;  Location: [facility] CATH LAB;  Service: Cardiovascular;  Laterality: N/A;   PERIPHERAL VASCULAR BALLOON ANGIOPLASTY Right 08/08/2020   Procedure: PERIPHERAL VASCULAR BALLOON ANGIOPLASTY;  Surgeon: Angelia Mould,  MD;  Location: [place] [facility];  Service: Cardiovascular;  Laterality: Right;  Posterior tibial    SHOULDER SURGERY Right    X 2   STUMP REVISION Right 01/09/2021   Procedure: REVISION RIGHT BELOW KNEE AMPUTATION;  Surgeon: [name], MD;  Location: [place];  Service: Orthopedics;  Laterality: Right;   TONSILLECTOMY  ~ [place] Right 04/13/2019   Procedure: RIGHT TOTAL HIP REVISION-POSTERIOR  APPROACH LATERAL;  Surgeon: Marybelle Killings, MD;  Location: [place];  Service: Orthopedics;  Laterality: Right;   TOTAL KNEE ARTHROPLASTY Left 07/23/2016   Procedure: LEFT TOTAL KNEE ARTHROPLASTY;  Surgeon: Marybelle Killings, MD;  Location: [place];  Service: Orthopedics;  Laterality: Left;    There were no vitals filed for this visit.   Subjective Assessment - 08/06/21 1618     Subjective He saw eye doctor today and had injections in his eye so he has blurry vision and pressure in his eyes which is impairing his balance.  He denies any issues with prosthesis or signifcant pain today    Patient is accompained by: Family member   granddaughter   Pertinent History Rt TTA, arthritis, right hip replacement, left knee arthroplasty, shoulder sg X2, gout, basal cell CA, LBP, back sg X3, IDDM, neuropathy, diverticulosis, heart murmur, HTN, MI,    Limitations Standing;House hold activities;Walking    Patient Stated Goals walk in community & on farm, get on tractor, yard work, drive    Pain Onset More than a month ago              [facility] Adult PT Treatment/Exercise - 08/06/21 0001       Transfers   Transfers Sit to Stand;Stand to Sit    Sit to Stand 6: Modified independent (Device/Increase time);With upper extremity assist;From chair/3-in-1    Stand to Sit 6: Modified independent (Device/Increase time);With upper extremity assist;To chair/3-in-1      Ambulation/Gait   Ambulation/Gait Yes    Ambulation/Gait Assistance 6: Modified independent (Device/Increase time);5: Supervision   mod ind  cane & supervision no AD   Ambulation Distance (Feet) 150 Feet   X2   Assistive device Straight cane;Prosthesis;None    Gait Pattern Step-through pattern;Decreased step length - right;Decreased stance time - left;Decreased weight shift to right;Antalgic      Neuro Re-ed    Neuro Re-ed Details  balance on foam pad narrow BOS 1 minute then progressed to head turns X10 and head nods X 10, then wide BOS on foam for eyes closed 15 sec X5      Knee/Hip Exercises: Stretches   Active Hamstring Stretch Both;2  reps;30 seconds    Active Hamstring Stretch Limitations at leg press      Knee/Hip Exercises: Aerobic   Nustep L7 X 8 min UE/LE      Knee/Hip Exercises: Machines for Strengthening   Cybex Knee Extension bilat 10# 3X15    Cybex Knee Flexion bilat 35# 3X15    Total Gym Leg Press BLEs 162# 15 reps then 125# 2 sets of 15. Then Rt leg only 25# 2X10      Knee/Hip Exercises: Standing   Forward Step Up Both;10 reps;Hand Hold: 2;Step Height: 6"    Forward Step Up Limitations using // bars                       PT Short Term Goals - 07/28/21 1600       PT SHORT TERM GOAL #1   Title Patient's wound is smaller than 0.5cm X 1.0cm with prosthetic wear most of awake hours.    Time 4    Period Weeks    Status New    Target Date 08/25/21      PT SHORT TERM GOAL #2   Title Patient able to ambulate with cane & prosthesis scanning & simple cognitive tasks with loss of balance & maintaining path.    Time 4    Period Weeks    Status New    Target Date 08/25/21      PT SHORT TERM GOAL #3   Title --    Status --    Target Date --      PT SHORT TERM GOAL #4   Title --    Status --    Target Date --      PT SHORT TERM GOAL #5   Title --    Status --    Target Date --               PT Long Term Goals - 07/28/21 1607       PT LONG TERM GOAL #1   Title Patient demonstrates & verbalized understanding of prosthetic care to enable safe utilization of prosthesis.    Time  8    Period Weeks    Status On-going    Target Date 09/24/21      PT LONG TERM GOAL #2   Title Patient tolerates prosthesis wear >90% of awake hours without skin or limb pain issues.    Time 8    Period Weeks    Status On-going    Target Date 09/24/21      PT LONG TERM GOAL #3   Title Berg Balance >/= 45/56 to indicate lower fall risk    Time 8    Period Weeks    Status Revised    Target Date 09/24/21      PT LONG TERM GOAL #4   Title Patient ambulates 500' including uneven terrain with cane & prosthesis modified independent    Time 8    Period Weeks    Status Revised    Target Date 09/24/21      PT LONG TERM GOAL #5   Title Patient negotiates ramps, curbs & stairs with cane & prosthesis modified independent.    Time 8    Period Weeks    Status On-going    Target Date 09/24/21                   Plan - 08/06/21 1643     Clinical Impression Statement He was  having some dizziness today but does not describe this as the room is spinning so this does not sound like BPPV. Due to the fact that he just had eye injections and feels 9/10 pressure in his eye vertigo testing was held off today and we may examine this futher next time if he still has dizziness. He had more difficulty walking today due to the dizziness so focused more on strength work with some balance training to his tolerance.    Personal Factors and Comorbidities Comorbidity 3+;Age;Time since onset of injury/illness/exacerbation    Comorbidities Rt TTA, arthritis, right hip replacement, left knee arthroplasty, shoulder sg X2, gout, basal cell CA, LBP, back sg X3, IDDM, neuropathy, diverticulosis, heart murmur, HTN, MI,    Examination-Activity Limitations Lift;Locomotion Level;Squat;Stairs;Stand;Transfers    Examination-Participation Restrictions Community Activity;Yard Work    [employer] Evolving/Moderate complexity    Rehab Potential Good    PT Frequency 1x / week    PT Duration 8  weeks    PT Treatment/Interventions ADLs/Self Care Home Management;DME Instruction;Gait training;Stair training;Functional mobility training;Therapeutic activities;Therapeutic exercise;Balance training;Neuromuscular re-education;Patient/family education;Prosthetic Training;Vestibular;Passive range of motion    PT Next Visit Plan vestibular asessement if still dealing with dizziness. update HEP, work on balance, strength & gait with TTA prosthesis, check wound    Consulted and Agree with Plan of Care Patient             Patient will benefit from skilled therapeutic intervention in order to improve the following deficits and impairments:  Abnormal gait, Decreased activity tolerance, Decreased balance, Decreased endurance, Decreased knowledge of use of DME, Decreased mobility, Decreased range of motion, Decreased skin integrity, Decreased scar mobility, Decreased strength, Increased edema, Impaired flexibility, Postural dysfunction, Prosthetic Dependency  Visit Diagnosis: Other abnormalities of gait and mobility  Stiffness of right knee, not elsewhere classified  Muscle weakness (generalized)  Unsteadiness on feet  Abnormal posture     Problem List Patient Active Problem List   Diagnosis Date Noted   Exudative age-related macular degeneration of left eye with active choroidal neovascularization ([place]) 07/28/2021   Exudative age-related macular degeneration of right eye with active choroidal neovascularization ([place]) 06/01/2021   Intermediate stage nonexudative age-related macular degeneration of both eyes 06/01/2021   Type 2 diabetes mellitus with diabetic polyneuropathy, with long-term current use of insulin ([place]) 03/24/2021   Hypokalemia    Constipation 02/02/2021   Fecal impaction ([place]) 02/01/2021   Wound dehiscence 01/09/2021   Dehiscence of amputation stump ([place])    Status post percutaneous transluminal coronary angioplasty 01/06/2021   Type II diabetes mellitus, uncontrolled  01/06/2021   Acute pancreatitis 01/06/2021   Diabetic peripheral vascular disease ([place]) 01/06/2021   Encounter for screening for other disorder 01/06/2021   Enlarged prostate 01/06/2021   Foot ulcer, right ([place]) 01/06/2021   Gout 01/06/2021   Headache 01/06/2021   Neck pain 01/06/2021   Hypoglycemia 01/06/2021   Loss of appetite 01/06/2021   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   Peripheral neuropathy 01/06/2021   Sciatica 01/06/2021   Vitamin B12 deficiency 01/06/2021   Vitamin D deficiency 01/06/2021   Weakness 01/06/2021   Diabetes mellitus type 2 with neurological manifestations ([place]) 01/06/2021   Protein-calorie malnutrition, severe 12/26/2020   Acute blood loss anemia 12/26/2020   Prerenal azotemia 12/26/2020   Below-knee amputation of right lower extremity ([place]) 12/12/2020   Gangrene of right foot ([place])    Diabetic neuropathy ([place]) 11/17/2020   Hyperglycemia due to type 2 diabetes mellitus ([place]) 11/17/2020   Long term (current)  use of insulin ([place]) 11/17/2020   Obesity 11/17/2020   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/2019   Hip dislocation, right ([place]) 04/13/2019   Other intervertebral disc degeneration, lumbar region 03/30/2019   CAD (coronary artery disease) 01/30/2019   Tobacco abuse 01/30/2019   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   Burn, foot, second degree, left, initial encounter 06/08/2017   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 [place], [place], [address] Phone: [phone]   Fax:  [phone]  Name: Keith Espinoza MRN: [id number] Date of Birth: [birth year]

## 2021-08-06 NOTE — Assessment & Plan Note (Signed)
OD with small region inferior to FAZ of active intraretinal fluid repeat injection Avastin today

## 2021-08-06 NOTE — Progress Notes (Signed)
08/06/2021     CHIEF COMPLAINT Patient presents for  Chief Complaint  Patient presents with   Retina Follow Up      HISTORY OF PRESENT ILLNESS: Keith Espinoza is a 80 y.o. male who presents to the clinic today for:   HPI     Retina Follow Up   Patient presents with  Wet AMD.  In right eye.  This started 1 week ago.  Duration of 1 week.  Since onset it is stable.      Last edited by Reather Littler, COA on 08/06/2021  1:06 PM.      Referring physician: [name], MD [address] [place],  [place] [address]  HISTORICAL INFORMATION:   Selected notes from the MEDICAL RECORD NUMBER    Lab Results  Component Value Date   HGBA1C 7.5 (A) 06/30/2021     CURRENT MEDICATIONS: No current outpatient medications on file. (Ophthalmic Drugs)   No current facility-administered medications for this visit. (Ophthalmic Drugs)   Current Outpatient Medications (Other)  Medication Sig   acetaminophen (TYLENOL) 500 MG tablet Take 1,000 mg by mouth every 6 (six) hours as needed (pain).   allopurinol (ZYLOPRIM) 300 MG tablet Take 300 mg by mouth daily.   amLODipine (NORVASC) 5 MG tablet Take 1 tablet (5 mg total) by mouth daily.   aspirin EC 81 MG tablet Take 81 mg by mouth daily.   Cholecalciferol (VITAMIN D3) 50 MCG (2000 UT) TABS Take 2,000 Units by mouth daily.    clopidogrel (PLAVIX) 75 MG tablet Take 1 tablet (75 mg total) by mouth daily.   Continuous Blood Gluc Receiver (DEXCOM G6 RECEIVER) DEVI 1 Device by Does not apply route as directed.   Continuous Blood Gluc Sensor (DEXCOM G6 SENSOR) MISC 1 Device by Does not apply route as directed.   Continuous Blood Gluc Transmit (DEXCOM G6 TRANSMITTER) MISC 1 Device by Does not apply route as directed.   Cyanocobalamin (B-12) 2500 MCG TABS Take 2,500 mcg by mouth daily.   empagliflozin (JARDIANCE) 25 MG TABS tablet Take 1 tablet (25 mg total) by mouth daily.   gabapentin (NEURONTIN) 100 MG capsule Take 2 capsules  (200 mg total) by mouth at bedtime. (Patient taking differently: Take 600 mg by mouth at bedtime.)   gabapentin (NEURONTIN) 400 MG capsule Take 1 capsule (400 mg total) by mouth 3 (three) times daily. (Patient taking differently: Take 400 mg by mouth daily.)   hydrochlorothiazide (HYDRODIURIL) 12.5 MG tablet Take 1 tablet (12.5 mg total) by mouth daily.   insulin degludec (TRESIBA FLEXTOUCH) 100 UNIT/ML FlexTouch Pen Inject 9 Units into the skin daily.   Insulin Pen Needle 32G X 4 MM MISC 1 Device by Does not apply route daily in the afternoon.   isosorbide mononitrate (IMDUR) 30 MG 24 hr tablet Take 1 tablet (30 mg total) by mouth daily. Please schedule yearly appointment for future refills. Thank you   linaclotide (LINZESS) 145 MCG CAPS capsule Take 1 capsule (145 mcg total) by mouth daily before breakfast.   losartan (COZAAR) 50 MG tablet Take 1 tablet (50 mg total) by mouth daily.   metFORMIN (GLUCOPHAGE) 1000 MG tablet Take 1 tablet (1,000 mg total) by mouth 2 (two) times daily.   methocarbamol (ROBAXIN) 500 MG tablet Take 1 tablet (500 mg total) by mouth every 6 (six) hours as needed for muscle spasms.   nitroGLYCERIN (NITROSTAT) 0.4 MG SL tablet Place 0.4 mg under the tongue every 5 (five) minutes  x 3 doses as needed for chest pain.    ONE TOUCH ULTRA TEST test strip CHECK BLOOD SUGAR ONCE DAILY AS DIRECTED   oxyCODONE (OXY IR/ROXICODONE) 5 MG immediate release tablet Take 1 tablet (5 mg total) by mouth every 4 (four) hours as needed for severe pain.   polyethylene glycol (MIRALAX) 17 g packet Take 17 g by mouth 2 (two) times daily.   potassium chloride SA (KLOR-CON) 20 MEQ tablet Take 1 tablet (20 mEq total) by mouth daily.   senna-docusate (SENOKOT-S) 8.6-50 MG tablet Take 1 tablet by mouth at bedtime.   terazosin (HYTRIN) 5 MG capsule Take 1 capsule (5 mg total) by mouth at bedtime.   traMADol (ULTRAM) 50 MG tablet Take 1 tablet (50 mg total) by mouth every 6 (six) hours as needed for  moderate pain.   traZODone (DESYREL) 50 MG tablet Take 1 tablet (50 mg total) by mouth at bedtime as needed for sleep. (Patient taking differently: Take 50 mg by mouth 2 (two) times daily.)   No current facility-administered medications for this visit. (Other)      REVIEW OF SYSTEMS:    ALLERGIES Allergies  Allergen Reactions   Simvastatin Other (See Comments)    SEVERE MYALGIAS    Zetia [Ezetimibe] Other (See Comments)    MYALGIAS   Dilaudid [Hydromorphone Hcl] Other (See Comments)    hallucination    PAST MEDICAL HISTORY Past Medical History:  Diagnosis Date   Allergic rhinitis    Allergic rhinitis    Arthritis    Basal cell carcinoma 11/01/2019    bcc left chest treatment [place] [id number] [id number]    Chronic leg pain    right   Chronic lower back pain    Coronary artery disease    a. Stenting to RCA 2004; staged DES to LAD and Cx 2004. DES to mRCA 2012. b. DES to mCx, PTCA to dCx 11/2011. c. Lateral wall MI 2013 s/p PTCA to distal Cx & DES to mid OM2 11/2011. d. Low risk nuc 04/2014, EF wnl.   COVID-19    Diabetes mellitus    Insulin dependent   Diabetic neuropathy ([place])    MILD   Diverticulosis    Dysrhythmia    [name] syndrome    Gout    right wrist; right foot; right elbow; have had it since 1970's   H/O hiatal hernia    Heart murmur    History of echocardiogram    aortic sclerosis per echo 12/09 EF 65%, otherwise normal   History of hemorrhoids    BLEEDING   History of kidney stones    h/o   Hypertension    Diagnosed 1995    Myocardial infarction [facility])    Pancreatic pseudocyst    a. s/p remote drainage 2006.   Thrombocytopenia ([place])    Seen on oldest labs in system from 2004   Vitamin B 12 deficiency    orally replaced   Past Surgical History:  Procedure Laterality Date   ABDOMINAL AORTOGRAM W/LOWER EXTREMITY Bilateral 08/08/2020   Procedure: ABDOMINAL AORTOGRAM W/LOWER EXTREMITY;  Surgeon: Angelia Mould, MD;  Location: [place] [facility];  Service:  Cardiovascular;  Laterality: Bilateral;   AMPUTATION Right 12/12/2020   Procedure: RIGHT BELOW KNEE AMPUTATION;  Surgeon: [name], MD;  Location: [place];  Service: Orthopedics;  Laterality: Right;   BACK SURGERY     "total of 3 times" S/P fall    CARPAL TUNNEL RELEASE Bilateral    CHOLECYSTECTOMY  1990's  COLONOSCOPY     CORONARY ANGIOPLASTY  11/11/11   CORONARY ANGIOPLASTY WITH STENT PLACEMENT  09/30/2011   "1 then; makes a total of 4"   CORONARY ANGIOPLASTY WITH STENT PLACEMENT  11/11/11   "1; makes a total of 5"   INGUINAL HERNIA REPAIR  2003   right   JOINT REPLACEMENT Right 04/03/2002   hip replacment   KNEE ARTHROSCOPY  1990's   left   LEFT HEART CATHETERIZATION WITH CORONARY ANGIOGRAM N/A 09/30/2011   Procedure: LEFT HEART CATHETERIZATION WITH CORONARY ANGIOGRAM;  Surgeon: Jettie Booze, MD;  Location: [facility] CATH LAB;  Service: Cardiovascular;  Laterality: N/A;  possible PCI   LEFT HEART CATHETERIZATION WITH CORONARY ANGIOGRAM N/A 11/15/2011   Procedure: LEFT HEART CATHETERIZATION WITH CORONARY ANGIOGRAM;  Surgeon: Jettie Booze, MD;  Location: [facility] CATH LAB;  Service: Cardiovascular;  Laterality: N/A;   PERCUTANEOUS CORONARY STENT INTERVENTION (PCI-S)  09/30/2011   Procedure: PERCUTANEOUS CORONARY STENT INTERVENTION (PCI-S);  Surgeon: Jettie Booze, MD;  Location: [facility] CATH LAB;  Service: Cardiovascular;;   PERCUTANEOUS CORONARY STENT INTERVENTION (PCI-S) N/A 11/11/2011   Procedure: PERCUTANEOUS CORONARY STENT INTERVENTION (PCI-S);  Surgeon: Jettie Booze, MD;  Location: [facility] CATH LAB;  Service: Cardiovascular;  Laterality: N/A;   PERIPHERAL VASCULAR BALLOON ANGIOPLASTY Right 08/08/2020   Procedure: PERIPHERAL VASCULAR BALLOON ANGIOPLASTY;  Surgeon: Angelia Mould, MD;  Location: [place] [facility];  Service: Cardiovascular;  Laterality: Right;  Posterior tibial    SHOULDER SURGERY Right    X 2   STUMP REVISION Right 01/09/2021   Procedure: REVISION RIGHT  BELOW KNEE AMPUTATION;  Surgeon: [name], MD;  Location: [place];  Service: Orthopedics;  Laterality: Right;   TONSILLECTOMY  ~ [place] Right 04/13/2019   Procedure: RIGHT TOTAL HIP REVISION-POSTERIOR  APPROACH LATERAL;  Surgeon: Marybelle Killings, MD;  Location: [place];  Service: Orthopedics;  Laterality: Right;   TOTAL KNEE ARTHROPLASTY Left 07/23/2016   Procedure: LEFT TOTAL KNEE ARTHROPLASTY;  Surgeon: Marybelle Killings, MD;  Location: [place];  Service: Orthopedics;  Laterality: Left;    FAMILY HISTORY Family History  Problem Relation Age of Onset   Diabetes Mother    Hyperlipidemia Mother    Hypertension Mother    Cancer Father    Hypertension Father    Diabetes Sister    Hypertension Sister    Cancer Brother    Heart attack Neg Hx     SOCIAL HISTORY Social History   Tobacco Use   Smoking status: Former   Smokeless tobacco: Current    Types: Chew   Tobacco comments:    quit 60 years ago  Vaping Use   Vaping Use: Never used  Substance Use Topics   Alcohol use: No   Drug use: No         OPHTHALMIC EXAM:  Base Eye Exam     Visual Acuity (ETDRS)       Right Left   Dist cc 20/25 +2 20/30 +2   Dist ph cc  NI    Correction: Glasses         Tonometry (Tonopen, 1:11 PM)       Right Left   Pressure 10 9         Pupils       Pupils Dark Light Shape React APD   Right PERRL 3 2 Round Brisk None   Left PERRL 3 2 Round Brisk None  Visual Fields (Counting fingers)       Left Right    Full Full         Extraocular Movement       Right Left    Full, Ortho Full, Ortho         Neuro/Psych     Oriented x3: Yes   Mood/Affect: Normal         Dilation     Right eye: 1.0% Mydriacyl, 2.5% Phenylephrine @ 1:10 PM           Slit Lamp and Fundus Exam     External Exam       Right Left   External Normal Normal         Slit Lamp Exam       Right Left   Lids/Lashes Normal Normal   Conjunctiva/Sclera White  and quiet White and quiet   Cornea Clear Clear   Anterior Chamber Deep and quiet Deep and quiet   Iris Round and reactive Round and reactive   Lens Centered posterior chamber intraocular lens Centered posterior chamber intraocular lens   Anterior Vitreous Normal Normal         Fundus Exam       Right Left   Posterior Vitreous Normal Normal   Disc Normal Normal   C/D Ratio 2.5 2.5   Macula Retinal pigment epithelial mottling, Intermediate age related macular degeneration Retinal pigment epithelial mottling, Intermediate age related macular degeneration   Vessels Normal, no DR Normal, no DR   Periphery Normal Normal            IMAGING AND PROCEDURES  Imaging and Procedures for 08/06/21  OCT, Retina - OU - Both Eyes       Right Eye Quality was borderline. Scan locations included subfoveal. Central Foveal Thickness: 264. Progression has worsened. Findings include abnormal foveal contour, subretinal scarring, inner retinal atrophy, intraretinal hyper-reflective material, intraretinal fluid.   Left Eye Quality was borderline. Scan locations included subfoveal. Central Foveal Thickness: 244. Progression has been stable. Findings include abnormal foveal contour, retinal drusen , intraretinal hyper-reflective material, intraretinal fluid.   Notes OD with small region of active leakage intraretinal fluid inferior to FAZ, will need intravitreal Avastin today     Intravitreal Injection, Pharmacologic Agent - OD - Right Eye       Time Out 08/06/2021. 1:35 PM. Confirmed correct patient, procedure, site, and patient consented.   Anesthesia Topical anesthesia was used. Anesthetic medications included Lidocaine 4%.   Procedure Preparation included Tobramycin 0.3%, 10% betadine to eyelids, 5% betadine to ocular surface. A 30 gauge needle was used.   Injection: 2.5 mg bevacizumab 2.5 MG/0.1ML   Route: Intravitreal, Site: Right Eye   NDC: [phone], Lot: [id number]    Post-op Post injection exam found visual acuity of at least counting fingers. The patient tolerated the procedure well. There were no complications. The patient received written and verbal post procedure care education. Post injection medications included ocuflox.              ASSESSMENT/PLAN:  Exudative age-related macular degeneration of left eye with active choroidal neovascularization (HCC) 1 week post recent injection OS, follow-up as scheduled  Exudative age-related macular degeneration of right eye with active choroidal neovascularization ([place]) OD with small region inferior to FAZ of active intraretinal fluid repeat injection Avastin today     ICD-10-CM   1. Exudative age-related macular degeneration of right eye with active choroidal neovascularization ([place])  H35.3211 OCT, Retina - OU -  Both Eyes    Intravitreal Injection, Pharmacologic Agent - OD - Right Eye    bevacizumab (AVASTIN) SOSY 2.5 mg    2. Exudative age-related macular degeneration of left eye with active choroidal neovascularization ([place])  H35.3221       1.  OD with early sign of CNVM inferior to FAZ improved overall yet still active.  Repeat injection today follow-up examination right eye again in 5 to 6 weeks  2.  OS follow-up as scheduled  3.  Ophthalmic Meds Ordered this visit:  Meds ordered this encounter  Medications   bevacizumab (AVASTIN) SOSY 2.5 mg       Return in about 6 weeks (around 09/17/2021) for dilate, OD, AVASTIN OCT.  There are no Patient Instructions on file for this visit.   Explained the diagnoses, plan, and follow up with the patient and they expressed understanding.  Patient expressed understanding of the importance of proper follow up care.   Clent Demark Georgette Helmer M.D. Diseases & Surgery of the Retina and Vitreous Retina [facility] Diabetic [place] 08/06/21     Abbreviations: M myopia (nearsighted); A astigmatism; H hyperopia (farsighted); P presbyopia; Mrx spectacle  prescription;  CTL contact lenses; OD right eye; OS left eye; OU both eyes  XT exotropia; ET esotropia; PEK punctate epithelial keratitis; PEE punctate epithelial erosions; DES dry eye syndrome; MGD meibomian gland dysfunction; ATs artificial tears; PFAT's preservative free artificial tears; [place] nuclear sclerotic cataract; PSC posterior subcapsular cataract; ERM epi-retinal membrane; PVD posterior vitreous detachment; RD retinal detachment; DM diabetes mellitus; DR diabetic retinopathy; NPDR non-proliferative diabetic retinopathy; PDR proliferative diabetic retinopathy; CSME clinically significant macular edema; DME diabetic macular edema; dbh dot blot hemorrhages; CWS cotton wool spot; POAG primary open angle glaucoma; C/D cup-to-disc ratio; HVF humphrey visual field; GVF goldmann visual field; OCT optical coherence tomography; IOP intraocular pressure; BRVO Branch retinal vein occlusion; CRVO central retinal vein occlusion; CRAO central retinal artery occlusion; BRAO branch retinal artery occlusion; RT retinal tear; SB scleral buckle; PPV pars plana vitrectomy; VH Vitreous hemorrhage; PRP panretinal laser photocoagulation; IVK intravitreal kenalog; VMT vitreomacular traction; MH Macular hole;  NVD neovascularization of the disc; NVE neovascularization elsewhere; AREDS age related eye disease study; ARMD age related macular degeneration; POAG primary open angle glaucoma; EBMD epithelial/anterior basement membrane dystrophy; ACIOL anterior chamber intraocular lens; IOL intraocular lens; PCIOL posterior chamber intraocular lens; Phaco/IOL phacoemulsification with intraocular lens placement; [place] photorefractive keratectomy; LASIK laser assisted in situ keratomileusis; HTN hypertension; DM diabetes mellitus; COPD chronic obstructive pulmonary disease

## 2021-08-10 ENCOUNTER — Ambulatory Visit (INDEPENDENT_AMBULATORY_CARE_PROVIDER_SITE_OTHER): Payer: Medicare Other | Admitting: Physical Therapy

## 2021-08-10 ENCOUNTER — Ambulatory Visit (INDEPENDENT_AMBULATORY_CARE_PROVIDER_SITE_OTHER): Payer: Medicare Other | Admitting: Orthopedic Surgery

## 2021-08-10 ENCOUNTER — Encounter: Payer: Self-pay | Admitting: Physical Therapy

## 2021-08-10 ENCOUNTER — Other Ambulatory Visit: Payer: Self-pay

## 2021-08-10 DIAGNOSIS — R2681 Unsteadiness on feet: Secondary | ICD-10-CM | POA: Diagnosis not present

## 2021-08-10 DIAGNOSIS — R293 Abnormal posture: Secondary | ICD-10-CM

## 2021-08-10 DIAGNOSIS — Z89511 Acquired absence of right leg below knee: Secondary | ICD-10-CM

## 2021-08-10 DIAGNOSIS — R2689 Other abnormalities of gait and mobility: Secondary | ICD-10-CM | POA: Diagnosis not present

## 2021-08-10 DIAGNOSIS — I70299 Other atherosclerosis of native arteries of extremities, unspecified extremity: Secondary | ICD-10-CM

## 2021-08-10 DIAGNOSIS — M25661 Stiffness of right knee, not elsewhere classified: Secondary | ICD-10-CM | POA: Diagnosis not present

## 2021-08-10 DIAGNOSIS — L97909 Non-pressure chronic ulcer of unspecified part of unspecified lower leg with unspecified severity: Secondary | ICD-10-CM

## 2021-08-10 DIAGNOSIS — M6281 Muscle weakness (generalized): Secondary | ICD-10-CM

## 2021-08-10 NOTE — Patient Instructions (Signed)
Place card with A & with B on wall at eye level 1-2 feet apart.  Stand back about 3-5 feet.  Keep eyes on letter A and move head right & left.  Move to stand midpoint between cards. Keep head still & move eyes back & forth with A & B. Letters should be in focus.  Turn head & eyes to A, cut eyes only first to B & follow up with head.  Cut eyes only to A & follow up with head.  Stand in corner with back to corner & chair back in front of you. 4 head movements A) right & left  B)  up & down  C) diagonals up-right & down-left  D)diagonals up-left & down-right  Standing on floor with feet together eyes open Standing on floor with feet hip width eyes closed  Standing on Foam or pillow with feet hip width eyes open

## 2021-08-10 NOTE — Therapy (Signed)
[facility] Physical Therapy [address] [place], [place], [address] Phone: [phone]   Fax:  [phone]  Physical Therapy Treatment  Patient Details  Name: Keith Espinoza MRN: [id number] Date of Birth: 23-Sep-1941 Referring Provider (PT): Meridee Score, MD   Encounter Date: 08/10/2021   PT End of Session - 08/10/21 1059     Visit Number 22    Number of Visits 28    Date for PT Re-Evaluation 09/24/21    Authorization Type Medicare A&[employer] and [employer]    Progress Note Due on Visit 30    PT Start Time 1059    PT Stop Time 1144    PT Time Calculation (min) 45 min    Activity Tolerance Patient tolerated treatment well    Behavior During Therapy [facility] for tasks assessed/performed             Past Medical History:  Diagnosis Date   Allergic rhinitis    Allergic rhinitis    Arthritis    Basal cell carcinoma 11/01/2019    bcc left chest treatment [place] cx3 [id number]    Chronic leg pain    right   Chronic lower back pain    Coronary artery disease    a. Stenting to RCA 2004; staged DES to LAD and Cx 2004. DES to mRCA 2012. b. DES to mCx, PTCA to dCx 11/2011. c. Lateral wall MI 2013 s/p PTCA to distal Cx & DES to mid OM2 11/2011. d. Low risk nuc 04/2014, EF wnl.   COVID-19    Diabetes mellitus    Insulin dependent   Diabetic neuropathy ([place])    MILD   Diverticulosis    Dysrhythmia    [name] syndrome    Gout    right wrist; right foot; right elbow; have had it since 1970's   H/O hiatal hernia    Heart murmur    History of echocardiogram    aortic sclerosis per echo 12/09 EF 65%, otherwise normal   History of hemorrhoids    BLEEDING   History of kidney stones    h/o   Hypertension    Diagnosed 1995    Myocardial infarction [facility])    Pancreatic pseudocyst    a. s/p remote drainage 2006.   Thrombocytopenia ([place])    Seen on oldest labs in system from 2004   Vitamin B 12 deficiency    orally replaced    Past Surgical History:  Procedure Laterality  Date   ABDOMINAL AORTOGRAM W/LOWER EXTREMITY Bilateral 08/08/2020   Procedure: ABDOMINAL AORTOGRAM W/LOWER EXTREMITY;  Surgeon: Angelia Mould, MD;  Location: [facility];  Service: Cardiovascular;  Laterality: Bilateral;   AMPUTATION Right 12/12/2020   Procedure: RIGHT BELOW KNEE AMPUTATION;  Surgeon: [name], MD;  Location: [place];  Service: Orthopedics;  Laterality: Right;   BACK SURGERY     "total of 3 times" S/P fall    CARPAL TUNNEL RELEASE Bilateral    CHOLECYSTECTOMY  1990's   COLONOSCOPY     CORONARY ANGIOPLASTY  11/11/11   CORONARY ANGIOPLASTY WITH STENT PLACEMENT  09/30/2011   "1 then; makes a total of 4"   CORONARY ANGIOPLASTY WITH STENT PLACEMENT  11/11/11   "1; makes a total of 5"   INGUINAL HERNIA REPAIR  2003   right   JOINT REPLACEMENT Right 04/03/2002   hip replacment   KNEE ARTHROSCOPY  1990's   left   LEFT HEART CATHETERIZATION WITH CORONARY ANGIOGRAM N/A 09/30/2011   Procedure: LEFT  HEART CATHETERIZATION WITH CORONARY ANGIOGRAM;  Surgeon: Jettie Booze, MD;  Location: [facility] CATH LAB;  Service: Cardiovascular;  Laterality: N/A;  possible PCI   LEFT HEART CATHETERIZATION WITH CORONARY ANGIOGRAM N/A 11/15/2011   Procedure: LEFT HEART CATHETERIZATION WITH CORONARY ANGIOGRAM;  Surgeon: Jettie Booze, MD;  Location[facility] CATH LAB;  Service: Cardiovascular;  Laterality: N/A;   PERCUTANEOUS CORONARY STENT INTERVENTION (PCI-S)  09/30/2011   Procedure: PERCUTANEOUS CORONARY STENT INTERVENTION (PCI-S);  Surgeon: Jettie Booze, MD;  Location: [facility] CATH LAB;  Service: Cardiovascular;;   PERCUTANEOUS CORONARY STENT INTERVENTION (PCI-S) N/A 11/11/2011   Procedure: PERCUTANEOUS CORONARY STENT INTERVENTION (PCI-S);  Surgeon: Jettie Booze, MD;  Location: [facility] CATH LAB;  Service: Cardiovascular;  Laterality: N/A;   PERIPHERAL VASCULAR BALLOON ANGIOPLASTY Right 08/08/2020   Procedure: PERIPHERAL VASCULAR BALLOON ANGIOPLASTY;  Surgeon: Angelia Mould,  MD;  Location: [place] [facility];  Service: Cardiovascular;  Laterality: Right;  Posterior tibial    SHOULDER SURGERY Right    X 2   STUMP REVISION Right 01/09/2021   Procedure: REVISION RIGHT BELOW KNEE AMPUTATION;  Surgeon: [name], MD;  Location: [place];  Service: Orthopedics;  Laterality: Right;   TONSILLECTOMY  ~ [place] Right 04/13/2019   Procedure: RIGHT TOTAL HIP REVISION-POSTERIOR  APPROACH LATERAL;  Surgeon: Marybelle Killings, MD;  Location: [place];  Service: Orthopedics;  Laterality: Right;   TOTAL KNEE ARTHROPLASTY Left 07/23/2016   Procedure: LEFT TOTAL KNEE ARTHROPLASTY;  Surgeon: Marybelle Killings, MD;  Location: [place];  Service: Orthopedics;  Laterality: Left;    There were no vitals filed for this visit.   Subjective Assessment - 08/10/21 1059     Subjective He saw Dr. Sharol Given just before PT today. He thinks wound is healing. He gave him new Vivewear shrinker for under liner due to limb volume change.  He is returning to see Dr. Sharol Given on 09/07/2021. He reports dizziness is better but head feels fuzzy.    Patient is accompained by: Family member   granddaughter   Pertinent History Rt TTA, arthritis, right hip replacement, left knee arthroplasty, shoulder sg X2, gout, basal cell CA, LBP, back sg X3, IDDM, neuropathy, diverticulosis, heart murmur, HTN, MI,    Limitations Standing;House hold activities;Walking    Patient Stated Goals walk in community & on farm, get on tractor, yard work, drive    Currently in Pain? No/denies    Pain Onset More than a month ago                               [facility] Adult PT Treatment/Exercise - 08/10/21 1059       Transfers   Transfers Sit to Stand;Stand to Sit    Sit to Stand 6: Modified independent (Device/Increase time);With upper extremity assist;From chair/3-in-1    Stand to Sit 6: Modified independent (Device/Increase time);With upper extremity assist;To chair/3-in-1      Ambulation/Gait    Ambulation/Gait Yes    Ambulation/Gait Assistance 5: Supervision   supervision no AD   Ambulation/Gait Assistance Details worked on scanning right/left & up/down maintaining path & pace    Ambulation Distance (Feet) 200 Feet   200' X 2   Assistive device Straight cane;Prosthesis;None   enter & exit with cane,  PT session with prosthesis only   Gait Pattern --    Ambulation Surface Level;Indoor    Gait velocity --    Ramp  5: Supervision   cane & TTA prosthesis   Curb 5: Supervision   cane & TTA prosthesis     Neuro Re-ed    Neuro Re-ed Details  see pt instructions for visual acuity & vestibular exercises.      Prosthetics   Prosthetic Care Comments  PT instructed in fall risk when prosthesis is off as is now using prosthesis at subconcious level.  Placing w/c close to bedside at night & limit removing prosthesis during awake hours.    Current prosthetic wear tolerance (days/week)  daily    Current prosthetic wear tolerance (#hours/day)  most of awake hours    Current prosthetic weight-bearing tolerance (hours/day)  Pt tolerating weight bearing for 8-10 minutes without c/o pain or limb discomfort.    Edema none    Residual limb condition  ant tibia wound now 0.4cmX 0.8cm no other open areas.  normal color, temperature & moisture.    Education Provided Other (comment)    Person(s) Educated Patient;Child(ren)    Education Method Explanation;Verbal cues    Education Method Verbalized understanding;Needs further instruction    Donning Prosthesis Modified independent (device/increased time)                     PT Education - 08/10/21 1246     Education Details Vestibular & visual acuity HEP - see pt instructions    Person(s) Educated Patient;Child(ren)    Methods Explanation;Demonstration;Tactile cues;Verbal cues;Handout    Comprehension Verbalized understanding;Returned demonstration              PT Short Term Goals - 07/28/21 1600       PT SHORT TERM GOAL #1    Title Patient's wound is smaller than 0.5cm X 1.0cm with prosthetic wear most of awake hours.    Time 4    Period Weeks    Status New    Target Date 08/25/21      PT SHORT TERM GOAL #2   Title Patient able to ambulate with cane & prosthesis scanning & simple cognitive tasks with loss of balance & maintaining path.    Time 4    Period Weeks    Status New    Target Date 08/25/21      PT SHORT TERM GOAL #3   Title --    Status --    Target Date --      PT SHORT TERM GOAL #4   Title --    Status --    Target Date --      PT SHORT TERM GOAL #5   Title --    Status --    Target Date --               PT Long Term Goals - 07/28/21 1607       PT LONG TERM GOAL #1   Title Patient demonstrates & verbalized understanding of prosthetic care to enable safe utilization of prosthesis.    Time 8    Period Weeks    Status On-going    Target Date 09/24/21      PT LONG TERM GOAL #2   Title Patient tolerates prosthesis wear >90% of awake hours without skin or limb pain issues.    Time 8    Period Weeks    Status On-going    Target Date 09/24/21      PT LONG TERM GOAL #3   Title [place] Balance >/= 45/56 to indicate lower fall risk    Time  8    Period Weeks    Status Revised    Target Date 09/24/21      PT LONG TERM GOAL #4   Title Patient ambulates 500' including uneven terrain with cane & prosthesis modified independent    Time 8    Period Weeks    Status Revised    Target Date 09/24/21      PT LONG TERM GOAL #5   Title Patient negotiates ramps, curbs & stairs with cane & prosthesis modified independent.    Time 8    Period Weeks    Status On-going    Target Date 09/24/21                   Plan - 08/10/21 1059     Clinical Impression Statement Patient's wound continues to heal using Vivewear shrinker under liner.  His vestibular issues seem to be clearing.  He seems to understand HEP to address vestibular issues.    Personal Factors and Comorbidities  Comorbidity 3+;Age;Time since onset of injury/illness/exacerbation    Comorbidities Rt TTA, arthritis, right hip replacement, left knee arthroplasty, shoulder sg X2, gout, basal cell CA, LBP, back sg X3, IDDM, neuropathy, diverticulosis, heart murmur, HTN, MI,    Examination-Activity Limitations Lift;Locomotion Level;Squat;Stairs;Stand;Transfers    Examination-Participation Restrictions Community Activity;Yard Work    [employer] Evolving/Moderate complexity    Rehab Potential Good    PT Frequency 1x / week    PT Duration 8 weeks    PT Treatment/Interventions ADLs/Self Care Home Management;DME Instruction;Gait training;Stair training;Functional mobility training;Therapeutic activities;Therapeutic exercise;Balance training;Neuromuscular re-education;Patient/family education;Prosthetic Training;Vestibular;Passive range of motion    PT Next Visit Plan continue to update HEP as frequency is 1x/wk,  balance & gait, check wound    Consulted and Agree with Plan of Care Patient             Patient will benefit from skilled therapeutic intervention in order to improve the following deficits and impairments:  Abnormal gait, Decreased activity tolerance, Decreased balance, Decreased endurance, Decreased knowledge of use of DME, Decreased mobility, Decreased range of motion, Decreased skin integrity, Decreased scar mobility, Decreased strength, Increased edema, Impaired flexibility, Postural dysfunction, Prosthetic Dependency  Visit Diagnosis: Other abnormalities of gait and mobility  Stiffness of right knee, not elsewhere classified  Muscle weakness (generalized)  Unsteadiness on feet  Abnormal posture     Problem List Patient Active Problem List   Diagnosis Date Noted   Exudative age-related macular degeneration of left eye with active choroidal neovascularization ([place]) 07/28/2021   Exudative age-related macular degeneration of right eye with active choroidal  neovascularization (HCC) 06/01/2021   Intermediate stage nonexudative age-related macular degeneration of both eyes 06/01/2021   Type 2 diabetes mellitus with diabetic polyneuropathy, with long-term current use of insulin (HCC) 03/24/2021   Hypokalemia    Constipation 02/02/2021   Fecal impaction ([place]) 02/01/2021   Wound dehiscence 01/09/2021   Dehiscence of amputation stump ([place])    Status post percutaneous transluminal coronary angioplasty 01/06/2021   Type II diabetes mellitus, uncontrolled 01/06/2021   Acute pancreatitis 01/06/2021   Diabetic peripheral vascular disease ([place]) 01/06/2021   Encounter for screening for other disorder 01/06/2021   Enlarged prostate 01/06/2021   Foot ulcer, right ([place]) 01/06/2021   Gout 01/06/2021   Headache 01/06/2021   Neck pain 01/06/2021   Hypoglycemia 01/06/2021   Loss of appetite 01/06/2021   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 03/16/2017    S/P total knee arthroplasty, left 10/26/2016   Hyperlipidemia 09/04/2014   Thrombocytopenia (HCC)    Precordial chest pain 04/05/2014   Coronary atherosclerosis of native coronary artery 10/01/2013   Other and unspecified hyperlipidemia 10/01/2013   Primary hypertension 10/01/2013   Diabetes mellitus ([place]) 10/01/2013   Esophageal reflux 10/01/2013   Hypertrophy of prostate without urinary obstruction and other lower urinary tract symptoms (LUTS) 10/01/2013    Jamey Reas, PT, DPT 08/10/2021, 12:50 PM  [facility] Physical Therapy [address] [place], [place], [address] Phone: [phone]   Fax:  [phone]  Name: KATHERINE SYME MRN: [id number] Date of Birth: [birth year]

## 2021-08-11 ENCOUNTER — Encounter: Payer: Self-pay | Admitting: Orthopedic Surgery

## 2021-08-11 ENCOUNTER — Encounter: Payer: Medicare Other | Admitting: Physical Therapy

## 2021-08-11 NOTE — Progress Notes (Signed)
Office Visit Note   Patient: Keith Espinoza           Date of Birth: [birth year]           MRN: [id number] Visit Date: 08/10/2021              Requested by: [name], MD [address] [place],  [place] [address] PCP: [name], MD  Chief Complaint  Patient presents with   Right Leg - Follow-up    Hx BKA  revision 01/09/2021      HPI: Patient is an 80 year old gentleman who is 7 months status post revision right transtibial amputation.  Patient has had pressure areas over the residual limb and he has modified his silicone liner.  Assessment & Plan: Visit Diagnoses:  1. Right below-knee amputee [facility])     Plan: Patient was given a short new shrinker to wear under the silicone liner.  Follow-Up Instructions: Return in about 4 weeks (around 09/07/2021).   Ortho Exam  Patient is alert, oriented, no adenopathy, well-dressed, normal affect, normal respiratory effort. Examination the ulcer over the tibial tubercle measures 2 x 3 mm.  This continues to heal there is no end bearing ulcer or callus.  Patient currently uses a cane for ambulation.  Imaging: No results found.   Labs: Lab Results  Component Value Date   HGBA1C 7.5 (A) 06/30/2021   HGBA1C 7.5 (H) 02/01/2021   HGBA1C 7.7 (H) 12/12/2020   REPTSTATUS 04/18/2019 FINAL 04/13/2019   GRAMSTAIN  04/13/2019    RARE WBC PRESENT, PREDOMINANTLY PMN NO ORGANISMS SEEN    CULT  04/13/2019    No growth aerobically or anaerobically. Performed at [facility], [place] [address], [place], [place] [address]      Lab Results  Component Value Date   ALBUMIN 3.5 01/31/2021   ALBUMIN 2.9 (L) 12/17/2020   ALBUMIN 4.4 04/10/2019    Lab Results  Component Value Date   MG 2.7 (H) 02/01/2021   No results found for: VD25OH  No results found for: PREALBUMIN CBC EXTENDED Latest Ref Rng & Units 02/03/2021 02/01/2021 01/31/2021  WBC 4.0 - 10.5 K/uL 6.2 9.3 11.6(H)  RBC 4.22 - 5.81 MIL/uL 3.36(L) 3.59(L)  3.88(L)  HGB 13.0 - 17.0 g/dL 9.9(L) 10.6(L) 11.2(L)  HCT 39.0 - 52.0 % 28.9(L) 32.1(L) 35.9(L)  PLT 150 - 400 K/uL 152 171 187  NEUTROABS 1.7 - 7.7 K/uL - - -  LYMPHSABS 0.7 - 4.0 K/uL - - -     There is no height or weight on file to calculate BMI.  Orders:  No orders of the defined types were placed in this encounter.  No orders of the defined types were placed in this encounter.    Procedures: No procedures performed  Clinical Data: No additional findings.  ROS:  All other systems negative, except as noted in the HPI. Review of Systems  Objective: Vital Signs: There were no vitals taken for this visit.  Specialty Comments:  No specialty comments available.  PMFS History: Patient Active Problem List   Diagnosis Date Noted   Exudative age-related macular degeneration of left eye with active choroidal neovascularization ([place]) 07/28/2021   Exudative age-related macular degeneration of right eye with active choroidal neovascularization ([place]) 06/01/2021   Intermediate stage nonexudative age-related macular degeneration of both eyes 06/01/2021   Type 2 diabetes mellitus with diabetic polyneuropathy, with long-term current use of insulin ([place]) 03/24/2021   Hypokalemia    Constipation 02/02/2021  Fecal impaction ([place]) 02/01/2021   Wound dehiscence 01/09/2021   Dehiscence of amputation stump (HCC)    Status post percutaneous transluminal coronary angioplasty 01/06/2021   Type II diabetes mellitus, uncontrolled 01/06/2021   Acute pancreatitis 01/06/2021   Diabetic peripheral vascular disease ([place]) 01/06/2021   Encounter for screening for other disorder 01/06/2021   Enlarged prostate 01/06/2021   Foot ulcer, right ([place]) 01/06/2021   Gout 01/06/2021   Headache 01/06/2021   Neck pain 01/06/2021   Hypoglycemia 01/06/2021   Loss of appetite 01/06/2021   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   Peripheral neuropathy 01/06/2021   Sciatica 01/06/2021   Vitamin B12  deficiency 01/06/2021   Vitamin D deficiency 01/06/2021   Weakness 01/06/2021   Diabetes mellitus type 2 with neurological manifestations ([place]) 01/06/2021   Protein-calorie malnutrition, severe 12/26/2020   Acute blood loss anemia 12/26/2020   Prerenal azotemia 12/26/2020   Below-knee amputation of right lower extremity ([place]) 12/12/2020   Gangrene of right foot ([place])    Diabetic neuropathy ([place]) 11/17/2020   Hyperglycemia due to type 2 diabetes mellitus ([place]) 11/17/2020   Long term (current) use of insulin ([place]) 11/17/2020   Obesity 11/17/2020   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/2019   Hip dislocation, right ([place]) 04/13/2019   Other intervertebral disc degeneration, lumbar region 03/30/2019   CAD (coronary artery disease) 01/30/2019   Tobacco abuse 01/30/2019   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   Burn, foot, second degree, left, initial encounter 06/08/2017   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 03/16/2017   S/P total knee arthroplasty, left 10/26/2016   Hyperlipidemia 09/04/2014   Thrombocytopenia (HCC)    Precordial chest pain 04/05/2014   Coronary atherosclerosis of native coronary artery 10/01/2013   Other and unspecified hyperlipidemia 10/01/2013   Primary hypertension 10/01/2013   Diabetes mellitus ([place]) 10/01/2013   Esophageal reflux 10/01/2013   Hypertrophy of prostate without urinary obstruction and other lower urinary tract symptoms (LUTS) 10/01/2013   Past Medical History:  Diagnosis Date   Allergic rhinitis    Allergic rhinitis    Arthritis    Basal cell carcinoma 11/01/2019    bcc left chest treatment TX [id number]    Chronic leg pain    right   Chronic lower back pain    Coronary artery disease    a. Stenting to RCA 2004; staged DES to LAD and Cx 2004. DES to mRCA 2012. b. DES to mCx, PTCA to dCx 11/2011. c. Lateral wall MI 2013 s/p PTCA to distal Cx & DES to mid OM2 11/2011. d. Low risk nuc 04/2014, EF wnl.   COVID-19    Diabetes mellitus     Insulin dependent   Diabetic neuropathy ([place])    MILD   Diverticulosis    Dysrhythmia    [name] syndrome    Gout    right wrist; right foot; right elbow; have had it since 1970's   H/O hiatal hernia    Heart murmur    History of echocardiogram    aortic sclerosis per echo 12/09 EF 65%, otherwise normal   History of hemorrhoids    BLEEDING   History of kidney stones    h/o   Hypertension    Diagnosed 1995    Myocardial infarction [facility])    Pancreatic pseudocyst    a. s/p remote drainage 2006.   Thrombocytopenia ([place])    Seen on oldest labs in system from 2004   Vitamin B 12 deficiency    orally replaced  Family History  Problem Relation Age of Onset   Diabetes Mother    Hyperlipidemia Mother    Hypertension Mother    Cancer Father    Hypertension Father    Diabetes Sister    Hypertension Sister    Cancer Brother    Heart attack Neg Hx     Past Surgical History:  Procedure Laterality Date   ABDOMINAL AORTOGRAM W/LOWER EXTREMITY Bilateral 08/08/2020   Procedure: ABDOMINAL AORTOGRAM W/LOWER EXTREMITY;  Surgeon: Angelia Mould, MD;  Location: [place] [facility];  Service: Cardiovascular;  Laterality: Bilateral;   AMPUTATION Right 12/12/2020   Procedure: RIGHT BELOW KNEE AMPUTATION;  Surgeon: [name], MD;  Location: [place];  Service: Orthopedics;  Laterality: Right;   BACK SURGERY     "total of 3 times" S/P fall    CARPAL TUNNEL RELEASE Bilateral    CHOLECYSTECTOMY  1990's   COLONOSCOPY     CORONARY ANGIOPLASTY  11/11/11   CORONARY ANGIOPLASTY WITH STENT PLACEMENT  09/30/2011   "1 then; makes a total of 4"   CORONARY ANGIOPLASTY WITH STENT PLACEMENT  11/11/11   "1; makes a total of 5"   INGUINAL HERNIA REPAIR  2003   right   JOINT REPLACEMENT Right 04/03/2002   hip replacment   KNEE ARTHROSCOPY  1990's   left   LEFT HEART CATHETERIZATION WITH CORONARY ANGIOGRAM N/A 09/30/2011   Procedure: LEFT HEART CATHETERIZATION WITH CORONARY ANGIOGRAM;  Surgeon:  Jettie Booze, MD;  Location: [facility] LAB;  Service: Cardiovascular;  Laterality: N/A;  possible PCI   LEFT HEART CATHETERIZATION WITH CORONARY ANGIOGRAM N/A 11/15/2011   Procedure: LEFT HEART CATHETERIZATION WITH CORONARY ANGIOGRAM;  Surgeon: Jettie Booze, MD;  Location: [facility] C[facility] LAB;  Service: Cardiovascular;  Laterality: N/A;   PERCUTANEOUS CORONARY STENT INTERVENTION (PCI-S)  09/30/2011   Procedure: PERCUTANEOUS CORONARY STENT INTERVENTION (PCI-S);  Surgeon: Jettie Booze, MD;  Location: [facility] CATH LAB;  Service: Cardiovascular;;   PERCUTANEOUS CORONARY STENT INTERVENTION (PCI-S) N/A 11/11/2011   Procedure: PERCUTANEOUS CORONARY STENT INTERVENTION (PCI-S);  Surgeon: Jettie Booze, MD;  Location: [facility] LAB;  Service: Cardiovascular;  Laterality: N/A;   PERIPHERAL VASCULAR BALLOON ANGIOPLASTY Right 08/08/2020   Procedure: PERIPHERAL VASCULAR BALLOON ANGIOPLASTY;  Surgeon: Angelia Mould, MD;  Location: [place] [facility];  Service: Cardiovascular;  Laterality: Right;  Posterior tibial    SHOULDER SURGERY Right    X 2   STUMP REVISION Right 01/09/2021   Procedure: REVISION RIGHT BELOW KNEE AMPUTATION;  Surgeon: [name], MD;  Location: [place];  Service: Orthopedics;  Laterality: Right;   TONSILLECTOMY  ~ [place] Right 04/13/2019   Procedure: RIGHT TOTAL HIP REVISION-POSTERIOR  APPROACH LATERAL;  Surgeon: Marybelle Killings, MD;  Location: [place];  Service: Orthopedics;  Laterality: Right;   TOTAL KNEE ARTHROPLASTY Left 07/23/2016   Procedure: LEFT TOTAL KNEE ARTHROPLASTY;  Surgeon: Marybelle Killings, MD;  Location: [place];  Service: Orthopedics;  Laterality: Left;   Social History   Occupational History   Occupation: Retired  Tobacco Use   Smoking status: Former   Smokeless tobacco: Current    Types: Chew   Tobacco comments:    quit 60 years ago  Vaping Use   Vaping Use: Never used  Substance and Sexual Activity   Alcohol use: No   Drug use: No    Sexual activity: Not Currently

## 2021-08-16 DIAGNOSIS — Z20828 Contact with and (suspected) exposure to other viral communicable diseases: Secondary | ICD-10-CM | POA: Diagnosis not present

## 2021-08-19 ENCOUNTER — Encounter: Payer: Self-pay | Admitting: Physical Therapy

## 2021-08-19 ENCOUNTER — Other Ambulatory Visit: Payer: Self-pay

## 2021-08-19 ENCOUNTER — Ambulatory Visit (INDEPENDENT_AMBULATORY_CARE_PROVIDER_SITE_OTHER): Payer: Medicare Other | Admitting: Physical Therapy

## 2021-08-19 DIAGNOSIS — M25661 Stiffness of right knee, not elsewhere classified: Secondary | ICD-10-CM | POA: Diagnosis not present

## 2021-08-19 DIAGNOSIS — R2681 Unsteadiness on feet: Secondary | ICD-10-CM | POA: Diagnosis not present

## 2021-08-19 DIAGNOSIS — R293 Abnormal posture: Secondary | ICD-10-CM

## 2021-08-19 DIAGNOSIS — M6281 Muscle weakness (generalized): Secondary | ICD-10-CM

## 2021-08-19 DIAGNOSIS — R2689 Other abnormalities of gait and mobility: Secondary | ICD-10-CM

## 2021-08-19 NOTE — Patient Instructions (Signed)
Place card with A & with B on wall with wrapping paper behind cards at eye level 1-2 feet apart.  Stand back about 10-15 feet.   Keep eyes on letter A and move head right & left.  Move to stand midpoint between cards. Keep head still & move eyes back & forth with A & B. Letters should be in focus.  Turn head & eyes to A, cut eyes only first to B & follow up with head.  Cut eyes only to A & follow up with head.

## 2021-08-19 NOTE — Therapy (Signed)
[facility] [address] [place], [place], [address] Phone: [phone]   Fax:  [phone]  Physical Therapy Treatment  Patient Details  Name: Keith Espinoza MRN: [id number] Date of Birth: 10/22/1940 Referring Provider (PT): Meridee Score, MD   Encounter Date: 08/19/2021   PT End of Session - 08/19/21 1429     Visit Number 23    Number of Visits 28    Date for PT Re-Evaluation 09/24/21    Authorization Type Medicare A[employer] and [employer]    Progress Note Due on Visit 30    PT Start Time 1429    PT Stop Time 1515    PT Time Calculation (min) 46 min    Activity Tolerance Patient tolerated treatment well    Behavior During Therapy [facility] for tasks assessed/performed             Past Medical History:  Diagnosis Date   Allergic rhinitis    Allergic rhinitis    Arthritis    Basal cell carcinoma 11/01/2019    bcc left chest treatment TX cx3 65fu    Chronic leg pain    right   Chronic lower back pain    Coronary artery disease    a. Stenting to RCA 2004; staged DES to LAD and Cx 2004. DES to mRCA 2012. b. DES to mCx, PTCA to dCx 11/2011. c. Lateral wall MI 2013 s/p PTCA to distal Cx & DES to mid OM2 11/2011. d. Low risk nuc 04/2014, EF wnl.   COVID-19    Diabetes mellitus    Insulin dependent   Diabetic neuropathy ([place])    MILD   Diverticulosis    Dysrhythmia    [name] syndrome    Gout    right wrist; right foot; right elbow; have had it since 1970's   H/O hiatal hernia    Heart murmur    History of echocardiogram    aortic sclerosis per echo 12/09 EF 65%, otherwise normal   History of hemorrhoids    BLEEDING   History of kidney stones    h/o   Hypertension    Diagnosed 1995    Myocardial infarction [facility])    Pancreatic pseudocyst    a. s/p remote drainage 2006.   Thrombocytopenia ([place])    Seen on oldest labs in system from 2004   Vitamin B 12 deficiency    orally replaced    Past Surgical History:  Procedure Laterality  Date   ABDOMINAL AORTOGRAM W/LOWER EXTREMITY Bilateral 08/08/2020   Procedure: ABDOMINAL AORTOGRAM W/LOWER EXTREMITY;  Surgeon: Angelia Mould, MD;  Location: [place] [facility];  Service: Cardiovascular;  Laterality: Bilateral;   AMPUTATION Right 12/12/2020   Procedure: RIGHT BELOW KNEE AMPUTATION;  Surgeon: [name], MD;  Location: [place];  Service: Orthopedics;  Laterality: Right;   BACK SURGERY     "total of 3 times" S/P fall    CARPAL TUNNEL RELEASE Bilateral    CHOLECYSTECTOMY  1990's   COLONOSCOPY     CORONARY ANGIOPLASTY  11/11/11   CORONARY ANGIOPLASTY WITH STENT PLACEMENT  09/30/2011   "1 then; makes a total of 4"   CORONARY ANGIOPLASTY WITH STENT PLACEMENT  11/11/11   "1; makes a total of 5"   INGUINAL HERNIA REPAIR  2003   right   JOINT REPLACEMENT Right 04/03/2002   hip replacment   KNEE ARTHROSCOPY  1990's   left   LEFT HEART CATHETERIZATION WITH CORONARY ANGIOGRAM N/A 09/30/2011   Procedure: LEFT  HEART CATHETERIZATION WITH CORONARY ANGIOGRAM;  Surgeon: Jettie Booze, MD;  Location: [facility] CATH LAB;  Service: Cardiovascular;  Laterality: N/A;  possible PCI   LEFT HEART CATHETERIZATION WITH CORONARY ANGIOGRAM N/A 11/15/2011   Procedure: LEFT HEART CATHETERIZATION WITH CORONARY ANGIOGRAM;  Surgeon: Jettie Booze, MD;  Location: [facility] CATH LAB;  Service: Cardiovascular;  Laterality: N/A;   PERCUTANEOUS CORONARY STENT INTERVENTION (PCI-S)  09/30/2011   Procedure: PERCUTANEOUS CORONARY STENT INTERVENTION (PCI-S);  Surgeon: Jettie Booze, MD;  Location: [facility] CATH LAB;  Service: Cardiovascular;;   PERCUTANEOUS CORONARY STENT INTERVENTION (PCI-S) N/A 11/11/2011   Procedure: PERCUTANEOUS CORONARY STENT INTERVENTION (PCI-S);  Surgeon: Jettie Booze, MD;  Location: [facility] CATH LAB;  Service: Cardiovascular;  Laterality: N/A;   PERIPHERAL VASCULAR BALLOON ANGIOPLASTY Right 08/08/2020   Procedure: PERIPHERAL VASCULAR BALLOON ANGIOPLASTY;  Surgeon: Angelia Mould,  MD;  Location: [place] [facility];  Service: Cardiovascular;  Laterality: Right;  Posterior tibial    SHOULDER SURGERY Right    X 2   STUMP REVISION Right 01/09/2021   Procedure: REVISION RIGHT BELOW KNEE AMPUTATION;  Surgeon: [name], MD;  Location: [place];  Service: Orthopedics;  Laterality: Right;   TONSILLECTOMY  ~ [place] Right 04/13/2019   Procedure: RIGHT TOTAL HIP REVISION-POSTERIOR  APPROACH LATERAL;  Surgeon: Marybelle Killings, MD;  Location: [place];  Service: Orthopedics;  Laterality: Right;   TOTAL KNEE ARTHROPLASTY Left 07/23/2016   Procedure: LEFT TOTAL KNEE ARTHROPLASTY;  Surgeon: Marybelle Killings, MD;  Location: [place];  Service: Orthopedics;  Laterality: Left;    There were no vitals filed for this visit.   Subjective Assessment - 08/19/21 1428     Patient is accompained by: Family member   granddaughter   Pertinent History Rt TTA, arthritis, right hip replacement, left knee arthroplasty, shoulder sg X2, gout, basal cell CA, LBP, back sg X3, IDDM, neuropathy, diverticulosis, heart murmur, HTN, MI,    Limitations Standing;House hold activities;Walking    Patient Stated Goals walk in community & on farm, get on tractor, yard work, drive    Currently in Pain? No/denies    Pain Onset More than a month ago                     Vestibular Assessment - 08/19/21 1448       Symptom Behavior   Type of Dizziness  Imbalance;Lightheadedness   woozy     Oculomotor Exam   Spontaneous Absent    Gaze-induced  Absent    Smooth Pursuits Intact    Saccades Intact;Hypometric    Comment convergence:      Oculomotor Exam-Fixation Suppressed    Left Head Impulse WNL    Right Head Impulse WNL      Vestibulo-Ocular Reflex   VOR 1 Head Only (x 1 viewing) WNL      Visual Acuity   Dynamic 4 line difference on eye chart with head movements laterally.      Positional Testing   Dix-Hallpike Dix-Hallpike Right;Dix-Hallpike Left    Horizontal Canal Testing  Horizontal Canal Right;Horizontal Canal Left                      [facility] Adult PT Treatment/Exercise - 08/19/21 1430       Transfers   Transfers Sit to Stand;Stand to Sit    Sit to Stand 6: Modified independent (Device/Increase time);With upper extremity assist;From chair/3-in-1    Stand to Sit  6: Modified independent (Device/Increase time);With upper extremity assist;To chair/3-in-1      Ambulation/Gait   Ambulation/Gait Yes    Ambulation/Gait Assistance 5: Supervision   supervision no AD   Ambulation Distance (Feet) 300 Feet   100' & 300'   Assistive device Straight cane;Prosthesis;None   enter & exit with cane,  PT session with prosthesis only   Ambulation Surface Level;Indoor    Ramp --    Curb --      Neuro Re-ed    Neuro Re-ed Details  see pt instructions for visual acuity & vestibular exercises with instructions to do on busy background like wrapping paper. Pt & dtr verbalized understanding.      Prosthetics   Prosthetic Care Comments  pt wanted to try liner with hole not over wound but created burning sensation.  PT advised to keep hole / window in liner over wound still.    Current prosthetic wear tolerance (days/week)  daily    Current prosthetic wear tolerance (#hours/day)  most of awake hours    Current prosthetic weight-bearing tolerance (hours/day)  Pt tolerating weight bearing for 8-10 minutes without c/o pain or limb discomfort.    Edema none    Residual limb condition  ant tibia wound now 0.4cmX 0.8cmwith granulation over wound bed no other open areas.  normal color, temperature & moisture.    Education Provided Other (comment)    Person(s) Educated Patient;Child(ren)    Education Method Explanation;Verbal cues    Education Method Verbalized understanding    Donning Prosthesis Modified independent (device/increased time)                     PT Education - 08/19/21 1535     Education Details updated visual acuity exercises to busy  background    Person(s) Educated Patient;Child(ren)    Methods Explanation;Verbal cues    Comprehension Verbalized understanding              PT Short Term Goals - 07/28/21 1600       PT SHORT TERM GOAL #1   Title Patient's wound is smaller than 0.5cm X 1.0cm with prosthetic wear most of awake hours.    Time 4    Period Weeks    Status New    Target Date 08/25/21      PT SHORT TERM GOAL #2   Title Patient able to ambulate with cane & prosthesis scanning & simple cognitive tasks with loss of balance & maintaining path.    Time 4    Period Weeks    Status New    Target Date 08/25/21      PT SHORT TERM GOAL #3   Title --    Status --    Target Date --      PT SHORT TERM GOAL #4   Title --    Status --    Target Date --      PT SHORT TERM GOAL #5   Title --    Status --    Target Date --               PT Long Term Goals - 07/28/21 1607       PT LONG TERM GOAL #1   Title Patient demonstrates & verbalized understanding of prosthetic care to enable safe utilization of prosthesis.    Time 8    Period Weeks    Status On-going    Target Date 09/24/21      PT LONG  TERM GOAL #2   Title Patient tolerates prosthesis wear >90% of awake hours without skin or limb pain issues.    Time 8    Period Weeks    Status On-going    Target Date 09/24/21      PT LONG TERM GOAL #3   Title Berg Balance >/= 45/56 to indicate lower fall risk    Time 8    Period Weeks    Status Revised    Target Date 09/24/21      PT LONG TERM GOAL #4   Title Patient ambulates 500' including uneven terrain with cane & prosthesis modified independent    Time 8    Period Weeks    Status Revised    Target Date 09/24/21      PT LONG TERM GOAL #5   Title Patient negotiates ramps, curbs & stairs with cane & prosthesis modified independent.    Time 8    Period Weeks    Status On-going    Target Date 09/24/21                   Plan - 08/19/21 1429     Clinical Impression  Statement Patient's vestibular assessment appears to be more related to visual acuity & motion sensitivity as all vertigo tests were negative.  Patient's wound continues to heal with Vivewear shrinker use under liner & hole in liner over wound.    Personal Factors and Comorbidities Comorbidity 3+;Age;Time since onset of injury/illness/exacerbation    Comorbidities Rt TTA, arthritis, right hip replacement, left knee arthroplasty, shoulder sg X2, gout, basal cell CA, LBP, back sg X3, IDDM, neuropathy, diverticulosis, heart murmur, HTN, MI,    Examination-Activity Limitations Lift;Locomotion Level;Squat;Stairs;Stand;Transfers    Examination-Participation Restrictions Community Activity;Yard Work    Merchant navy officer Evolving/Moderate complexity    Rehab Potential Good    PT Frequency 1x / week    PT Duration 8 weeks    PT Treatment/Interventions ADLs/Self Care Home Management;DME Instruction;Gait training;Stair training;Functional mobility training;Therapeutic activities;Therapeutic exercise;Balance training;Neuromuscular re-education;Patient/family education;Prosthetic Training;Vestibular;Passive range of motion    PT Next Visit Plan continue to update HEP as frequency is 1x/wk,  balance & gait, check wound    Consulted and Agree with Plan of Care Patient             Patient will benefit from skilled therapeutic intervention in order to improve the following deficits and impairments:  Abnormal gait, Decreased activity tolerance, Decreased balance, Decreased endurance, Decreased knowledge of use of DME, Decreased mobility, Decreased range of motion, Decreased skin integrity, Decreased scar mobility, Decreased strength, Increased edema, Impaired flexibility, Postural dysfunction, Prosthetic Dependency  Visit Diagnosis: Stiffness of right knee, not elsewhere classified  Other abnormalities of gait and mobility  Muscle weakness (generalized)  Unsteadiness on feet  Abnormal  posture     Problem List Patient Active Problem List   Diagnosis Date Noted   Exudative age-related macular degeneration of left eye with active choroidal neovascularization ([place]) 07/28/2021   Exudative age-related macular degeneration of right eye with active choroidal neovascularization (HCC) 06/01/2021   Intermediate stage nonexudative age-related macular degeneration of both eyes 06/01/2021   Type 2 diabetes mellitus with diabetic polyneuropathy, with long-term current use of insulin (HCC) 03/24/2021   Hypokalemia    Constipation 02/02/2021   Fecal impaction ([place]) 02/01/2021   Wound dehiscence 01/09/2021   Dehiscence of amputation stump (HCC)    Status post percutaneous transluminal coronary angioplasty 01/06/2021   Type II diabetes mellitus, uncontrolled 01/06/2021  Acute pancreatitis 01/06/2021   Diabetic peripheral vascular disease ([place]) 01/06/2021   Encounter for screening for other disorder 01/06/2021   Enlarged prostate 01/06/2021   Foot ulcer, right ([place]) 01/06/2021   Gout 01/06/2021   Headache 01/06/2021   Neck pain 01/06/2021   Hypoglycemia 01/06/2021   Loss of appetite 01/06/2021   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   Peripheral neuropathy 01/06/2021   Sciatica 01/06/2021   Vitamin B12 deficiency 01/06/2021   Vitamin D deficiency 01/06/2021   Weakness 01/06/2021   Diabetes mellitus type 2 with neurological manifestations ([place]) 01/06/2021   Protein-calorie malnutrition, severe 12/26/2020   Acute blood loss anemia 12/26/2020   Prerenal azotemia 12/26/2020   Below-knee amputation of right lower extremity ([place]) 12/12/2020   Gangrene of right foot ([place])    Diabetic neuropathy ([place]) 11/17/2020   Hyperglycemia due to type 2 diabetes mellitus ([place]) 11/17/2020   Long term (current) use of insulin ([place]) 11/17/2020   Obesity 11/17/2020   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/2019   Hip dislocation, right ([place]) 04/13/2019   Other intervertebral disc degeneration,  lumbar region 03/30/2019   CAD (coronary artery disease) 01/30/2019   Tobacco abuse 01/30/2019   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   Burn, foot, second degree, left, initial encounter 06/08/2017   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 [place], [place], [address] Phone: [phone]   Fax:  [phone]  Name: Keith Espinoza MRN: [id number] Date of Birth: 09-28-1941

## 2021-08-20 ENCOUNTER — Encounter: Payer: Medicare Other | Admitting: Physical Therapy

## 2021-08-24 ENCOUNTER — Other Ambulatory Visit: Payer: Self-pay | Admitting: *Deleted

## 2021-08-24 ENCOUNTER — Encounter: Payer: Medicare Other | Admitting: Physical Therapy

## 2021-08-24 NOTE — Patient Outreach (Addendum)
[facility]) Care Management  08/24/2021  EDWARD GUTHMILLER 04-10-1941 [id number]   Outgoing call placed to member, no answer, HIPAA compliant voice message left.  Will follow up within the next 3-4 business days.    Update @ 1625:  Incoming call received from member.  State he is doing well, continues to adjust to his prosthesis.  Attending PT several days a week, family very supportive.  Aware that going forward, chronic care management team from primary office will take over case.  Denies any urgent concerns, encouraged to contact this care manager with questions.  Will close case at this time.  Care Plan : [facility] Plan of Care (Adult)  Updates made by Valente David, RN since 08/24/2021 12:00 AM     Problem: knowledge deficit regarding chronic care management of chronic conditions (PVD, DM, HTN)   Priority: High     Long-Range Goal: Member will be able to verbalize management of chronic conditions in the next 6 months Completed 08/24/2021  Start Date: 07/28/2021  Expected End Date: 01/25/2022  Priority: High  Note:   Current Barriers:  Knowledge Deficits related to plan of care for management of HTN, DMII, and BKA Chronic Disease Management support and education needs related to HTN, DMII, and BKA  RNCM Clinical Goal(s):  Patient will verbalize understanding of plan for management of HTN, DMII, and BKA take all medications exactly as prescribed and will call provider for medication related questions attend all scheduled medical appointments: Ortho on 11/7, [place] on 12/20.  work with Outpatient PT to increase stability on new prosthesis  through collaboration with Consulting civil engineer, provider, and care team.   10/25 - Patient report he will have initial consultation with [place] in December.  Unsure if he will stop seeing Dr. Inda Merlin and have PCP at [facility] but will notify this [facility] after visit complete  Interventions: Inter-disciplinary care team collaboration (see longitudinal  plan of care) Evaluation of current treatment plan related to  self management and patient's adherence to plan as established by provider  Hypertension Interventions: Last practice recorded BP readings:  BP Readings from Last 3 Encounters:  06/30/21 118/72  03/24/21 (!) 124/55  02/18/21 107/66  Most recent eGFR/CrCl:  Lab Results  Component Value Date   EGFR 88 03/24/2021    No components found for: CRCL  Evaluation of current treatment plan related to hypertension self management and patient's adherence to plan as established by provider; Provided education to patient re: stroke prevention, s/s of heart attack and stroke; Reviewed medications with patient and discussed importance of compliance; Advised patient, providing education and rationale, to monitor blood pressure daily and record, calling PCP for findings outside established parameters;   BKANew goal. Evaluation of current treatment plan related to  BKA , ADL IADL limitations self-management and patient's adherence to plan as established by provider. Discussed plans with patient for ongoing care management follow up and provided patient with direct contact information for care management team Advised patient to continue therapy sessions with outpatient PT; Provided education to patient re: decreasing fall risk and use of prostethic; Reviewed scheduled/upcoming provider appointments including; Dr. Sharol Given 11/7  Diabetes Interventions: Assessed patient's understanding of A1c goal: <7% Provided education to patient about basic DM disease process; Reviewed medications with patient and discussed importance of medication adherence; Reviewed scheduled/upcoming provider appointments including: 1/3 with endocrinology; Advised patient, providing education and rationale, to check cbg daily and record, calling provider  for findings outside established parameters; Lab Results  Component  Value Date   HGBA1C 7.5 (A) 06/30/2021   Patient  Goals/Self-Care Activities: Patient will self administer medications as prescribed Patient will attend all scheduled provider appointments Patient will continue to perform ADL's independently  Follow Up Plan:  No further follow up required: Case closed, member will be followed by CCM team at PCP office.       Valente David, [place], MSN [place] [phone]

## 2021-08-25 ENCOUNTER — Ambulatory Visit: Payer: Self-pay | Admitting: *Deleted

## 2021-08-25 ENCOUNTER — Encounter: Payer: Medicare Other | Admitting: Physical Therapy

## 2021-08-31 ENCOUNTER — Ambulatory Visit: Payer: Self-pay | Admitting: *Deleted

## 2021-08-31 ENCOUNTER — Encounter: Payer: Medicare Other | Admitting: Physical Therapy

## 2021-09-01 ENCOUNTER — Encounter: Payer: Medicare Other | Admitting: Physical Therapy

## 2021-09-02 ENCOUNTER — Other Ambulatory Visit: Payer: Self-pay

## 2021-09-02 ENCOUNTER — Encounter: Payer: Self-pay | Admitting: Physical Therapy

## 2021-09-02 ENCOUNTER — Ambulatory Visit (INDEPENDENT_AMBULATORY_CARE_PROVIDER_SITE_OTHER): Payer: Medicare Other | Admitting: Physical Therapy

## 2021-09-02 DIAGNOSIS — R2689 Other abnormalities of gait and mobility: Secondary | ICD-10-CM

## 2021-09-02 DIAGNOSIS — M6281 Muscle weakness (generalized): Secondary | ICD-10-CM

## 2021-09-02 DIAGNOSIS — R2681 Unsteadiness on feet: Secondary | ICD-10-CM | POA: Diagnosis not present

## 2021-09-02 DIAGNOSIS — M25661 Stiffness of right knee, not elsewhere classified: Secondary | ICD-10-CM | POA: Diagnosis not present

## 2021-09-02 DIAGNOSIS — R293 Abnormal posture: Secondary | ICD-10-CM | POA: Diagnosis not present

## 2021-09-02 NOTE — Therapy (Signed)
[facility] Therapy [address] [place], [place], [address] Phone: [phone]   Fax:  [phone]  Physical Therapy Treatment  Patient Details  Name: Keith Espinoza MRN: [id number] Date of Birth: [birth year] Referring Provider (PT): Meridee Score, MD   Encounter Date: 09/02/2021   PT End of Session - 09/02/21 1520     Visit Number 24    Number of Visits 28    Date for PT Re-Evaluation 09/24/21    Authorization Type Medicare A[employer] and [employer]    Progress Note Due on Visit 30    PT Start Time 1515    PT Stop Time 1600    PT Time Calculation (min) 45 min    Activity Tolerance Patient tolerated treatment well    Behavior During Therapy [facility] for tasks assessed/performed             Past Medical History:  Diagnosis Date   Allergic rhinitis    Allergic rhinitis    Arthritis    Basal cell carcinoma 11/01/2019    bcc left chest treatment TX cx3 20fu    Chronic leg pain    right   Chronic lower back pain    Coronary artery disease    a. Stenting to RCA 2004; staged DES to LAD and Cx 2004. DES to mRCA 2012. b. DES to mCx, PTCA to dCx 11/2011. c. Lateral wall MI 2013 s/p PTCA to distal Cx & DES to mid OM2 11/2011. d. Low risk nuc 04/2014, EF wnl.   COVID-19    Diabetes mellitus    Insulin dependent   Diabetic neuropathy ([place])    MILD   Diverticulosis    Dysrhythmia    [name] syndrome    Gout    right wrist; right foot; right elbow; have had it since 1970's   H/O hiatal hernia    Heart murmur    History of echocardiogram    aortic sclerosis per echo 12/09 EF 65%, otherwise normal   History of hemorrhoids    BLEEDING   History of kidney stones    h/o   Hypertension    Diagnosed 1995    Myocardial infarction [facility])    Pancreatic pseudocyst    a. s/p remote drainage 2006.   Thrombocytopenia ([place])    Seen on oldest labs in system from 2004   Vitamin B 12 deficiency    orally replaced    Past Surgical History:  Procedure Laterality  Date   ABDOMINAL AORTOGRAM W/LOWER EXTREMITY Bilateral 08/08/2020   Procedure: ABDOMINAL AORTOGRAM W/LOWER EXTREMITY;  Surgeon: Angelia Mould, MD;  Location: [place] [facility];  Service: Cardiovascular;  Laterality: Bilateral;   AMPUTATION Right 12/12/2020   Procedure: RIGHT BELOW KNEE AMPUTATION;  Surgeon: [name], MD;  Location: [place];  Service: Orthopedics;  Laterality: Right;   BACK SURGERY     "total of 3 times" S/P fall    CARPAL TUNNEL RELEASE Bilateral    CHOLECYSTECTOMY  1990's   COLONOSCOPY     CORONARY ANGIOPLASTY  11/11/11   CORONARY ANGIOPLASTY WITH STENT PLACEMENT  09/30/2011   "1 then; makes a total of 4"   CORONARY ANGIOPLASTY WITH STENT PLACEMENT  11/11/11   "1; makes a total of 5"   INGUINAL HERNIA REPAIR  2003   right   JOINT REPLACEMENT Right 04/03/2002   hip replacment   KNEE ARTHROSCOPY  1990's   left   LEFT HEART CATHETERIZATION WITH CORONARY ANGIOGRAM N/A 09/30/2011   Procedure: LEFT  HEART CATHETERIZATION WITH CORONARY ANGIOGRAM;  Surgeon: Jettie Booze, MD;  Location: [facility] CATH LAB;  Service: Cardiovascular;  Laterality: N/A;  possible PCI   LEFT HEART CATHETERIZATION WITH CORONARY ANGIOGRAM N/A 11/15/2011   Procedure: LEFT HEART CATHETERIZATION WITH CORONARY ANGIOGRAM;  Surgeon: Jettie Booze, MD;  Location: [facility] CATH LAB;  Service: Cardiovascular;  Laterality: N/A;   PERCUTANEOUS CORONARY STENT INTERVENTION (PCI-S)  09/30/2011   Procedure: PERCUTANEOUS CORONARY STENT INTERVENTION (PCI-S);  Surgeon: Jettie Booze, MD;  Location: [facility] CATH LAB;  Service: Cardiovascular;;   PERCUTANEOUS CORONARY STENT INTERVENTION (PCI-S) N/A 11/11/2011   Procedure: PERCUTANEOUS CORONARY STENT INTERVENTION (PCI-S);  Surgeon: Jettie Booze, MD;  Location: [facility] CATH LAB;  Service: Cardiovascular;  Laterality: N/A;   PERIPHERAL VASCULAR BALLOON ANGIOPLASTY Right 08/08/2020   Procedure: PERIPHERAL VASCULAR BALLOON ANGIOPLASTY;  Surgeon: Angelia Mould,  MD;  Location: [place] [facility];  Service: Cardiovascular;  Laterality: Right;  Posterior tibial    SHOULDER SURGERY Right    X 2   STUMP REVISION Right 01/09/2021   Procedure: REVISION RIGHT BELOW KNEE AMPUTATION;  Surgeon: [name], MD;  Location: [place];  Service: Orthopedics;  Laterality: Right;   TONSILLECTOMY  ~ [place] Right 04/13/2019   Procedure: RIGHT TOTAL HIP REVISION-POSTERIOR  APPROACH LATERAL;  Surgeon: Marybelle Killings, MD;  Location: [place];  Service: Orthopedics;  Laterality: Right;   TOTAL KNEE ARTHROPLASTY Left 07/23/2016   Procedure: LEFT TOTAL KNEE ARTHROPLASTY;  Surgeon: Marybelle Killings, MD;  Location: [place];  Service: Orthopedics;  Laterality: Left;    There were no vitals filed for this visit.   Subjective Assessment - 09/02/21 1515     Subjective His wound healed.  The bottom of bone on inside is sore. He had to cancel appt with prosthetist and rescheduled for Mon, 12/5.    Patient is accompained by: Family member   granddaughter   Pertinent History Rt TTA, arthritis, right hip replacement, left knee arthroplasty, shoulder sg X2, gout, basal cell CA, LBP, back sg X3, IDDM, neuropathy, diverticulosis, heart murmur, HTN, MI,    Limitations Standing;House hold activities;Walking    Patient Stated Goals walk in community & on farm, get on tractor, yard work, drive    Currently in Pain? No/denies    Pain Onset More than a month ago                               [facility] Adult PT Treatment/Exercise - 09/02/21 1515       Neuro Re-ed    Neuro Re-ed Details  balance exercises - gaze stabilization initially on solid floor then progressed to foam which required minA / tactile cues for balance.  5 cards set up in cross pattern on mirror with gym in reflection for busy background.  1)focus on center A with head turns  2)head forward eye movement right /left to B & C cards.  3)head forward eye movement up/down to D & E cards.  4)move eyes  first then head right /left to B & C cards  5)move eyes first then head up/down to D & E cards.   10 reps ea.  Pt & dtr verbalized home set up of cards and pt verbalized understanding of HEP.      Prosthetics   Prosthetic Care Comments  prosthesis is 1/4" too short.  PT assessed for level pelvis using lift.  PT  explained recommendations for prosthetist of grinding area corresponding to distal medial tibia pain, pretibial pads & lengthening prosthesis 1/4".  When he is ready for socket revision at 6 months, he would benefit from suspension system that does not use pin lock.    Current prosthetic wear tolerance (days/week)  daily    Current prosthetic wear tolerance (#hours/day)  most of awake hours    Current prosthetic weight-bearing tolerance (hours/day)  Pt tolerating weight bearing for 8-10 minutes without c/o pain or limb discomfort.  Pt reported that he had been experiencing some pain upon initiating weight bearing after sitting for a while.  PT recommended lateral weight shifts upon arising before ambulating.  He reported this seemed to help.    Edema none    Residual limb condition  anterior tibia wound with 0.1cm scab only.    Education Provided Other (comment)   see prosthetic care comments & weight bearing tolerance   Person(s) Educated Patient;Child(ren)    Education Method Explanation;Verbal cues;Demonstration    Education Method Verbalized understanding;Verbal cues required;Needs further instruction    Donning Prosthesis Modified independent (device/increased time)                       PT Short Term Goals - 09/02/21 1721       PT SHORT TERM GOAL #1   Title Patient's wound is smaller than 0.5cm X 1.0cm with prosthetic wear most of awake hours.    Time 4    Period Weeks    Status Achieved    Target Date 08/25/21      PT SHORT TERM GOAL #2   Title Patient able to ambulate with cane & prosthesis scanning & simple cognitive tasks with loss of balance & maintaining  path.    Time 4    Period Weeks    Status Achieved    Target Date 08/25/21               PT Long Term Goals - 07/28/21 1607       PT LONG TERM GOAL #1   Title Patient demonstrates & verbalized understanding of prosthetic care to enable safe utilization of prosthesis.    Time 8    Period Weeks    Status On-going    Target Date 09/24/21      PT LONG TERM GOAL #2   Title Patient tolerates prosthesis wear >90% of awake hours without skin or limb pain issues.    Time 8    Period Weeks    Status On-going    Target Date 09/24/21      PT LONG TERM GOAL #3   Title Berg Balance >/= 45/56 to indicate lower fall risk    Time 8    Period Weeks    Status Revised    Target Date 09/24/21      PT LONG TERM GOAL #4   Title Patient ambulates 500' including uneven terrain with cane & prosthesis modified independent    Time 8    Period Weeks    Status Revised    Target Date 09/24/21      PT LONG TERM GOAL #5   Title Patient negotiates ramps, curbs & stairs with cane & prosthesis modified independent.    Time 8    Period Weeks    Status On-going    Target Date 09/24/21                   Plan - 09/02/21 1521  Clinical Impression Statement His wound appears almost completely healed.  His distal medial tibia appears to have bone spur and is more prominent with typical limb shrinkage in first socket / prosthesis.  He would benefit from prosthetist grinding corresponding area on pelite liner, adding pretibial pads. Also 1/4" too short now that he keeps left knee extended in stance more.  He appears to understand gaze stabilization on busy background exercises.    Personal Factors and Comorbidities Comorbidity 3+;Age;Time since onset of injury/illness/exacerbation    Comorbidities Rt TTA, arthritis, right hip replacement, left knee arthroplasty, shoulder sg X2, gout, basal cell CA, LBP, back sg X3, IDDM, neuropathy, diverticulosis, heart murmur, HTN, MI,     Examination-Activity Limitations Lift;Locomotion Level;Squat;Stairs;Stand;Transfers    Examination-Participation Restrictions Community Activity;Yard Work    [employer] Evolving/Moderate complexity    Rehab Potential Good    PT Frequency 1x / week    PT Duration 8 weeks    PT Treatment/Interventions ADLs/Self Care Home Management;DME Instruction;Gait training;Stair training;Functional mobility training;Therapeutic activities;Therapeutic exercise;Balance training;Neuromuscular re-education;Patient/family education;Prosthetic Training;Vestibular;Passive range of motion    PT Next Visit Plan continue to update HEP as frequency is 1x/wk,  balance & gait, check wound, if wound healed check LTGs with possible discharge    Consulted and Agree with Plan of Care Patient             Patient will benefit from skilled therapeutic intervention in order to improve the following deficits and impairments:  Abnormal gait, Decreased activity tolerance, Decreased balance, Decreased endurance, Decreased knowledge of use of DME, Decreased mobility, Decreased range of motion, Decreased skin integrity, Decreased scar mobility, Decreased strength, Increased edema, Impaired flexibility, Postural dysfunction, Prosthetic Dependency  Visit Diagnosis: Stiffness of right knee, not elsewhere classified  Other abnormalities of gait and mobility  Muscle weakness (generalized)  Unsteadiness on feet  Abnormal posture     Problem List Patient Active Problem List   Diagnosis Date Noted   Exudative age-related macular degeneration of left eye with active choroidal neovascularization ([place]) 07/28/2021   Exudative age-related macular degeneration of right eye with active choroidal neovascularization (HCC) 06/01/2021   Intermediate stage nonexudative age-related macular degeneration of both eyes 06/01/2021   Type 2 diabetes mellitus with diabetic polyneuropathy, with long-term current use of  insulin (HCC) 03/24/2021   Hypokalemia    Constipation 02/02/2021   Fecal impaction ([place]) 02/01/2021   Wound dehiscence 01/09/2021   Dehiscence of amputation stump (HCC)    Status post percutaneous transluminal coronary angioplasty 01/06/2021   Type II diabetes mellitus, uncontrolled 01/06/2021   Acute pancreatitis 01/06/2021   Diabetic peripheral vascular disease ([place]) 01/06/2021   Encounter for screening for other disorder 01/06/2021   Enlarged prostate 01/06/2021   Foot ulcer, right ([place]) 01/06/2021   Gout 01/06/2021   Headache 01/06/2021   Neck pain 01/06/2021   Hypoglycemia 01/06/2021   Loss of appetite 01/06/2021   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   Peripheral neuropathy 01/06/2021   Sciatica 01/06/2021   Vitamin B12 deficiency 01/06/2021   Vitamin D deficiency 01/06/2021   Weakness 01/06/2021   Diabetes mellitus type 2 with neurological manifestations ([place]) 01/06/2021   Protein-calorie malnutrition, severe 12/26/2020   Acute blood loss anemia 12/26/2020   Prerenal azotemia 12/26/2020   Below-knee amputation of right lower extremity ([place]) 12/12/2020   Gangrene of right foot ([place])    Diabetic neuropathy ([place]) 11/17/2020   Hyperglycemia due to type 2 diabetes mellitus ([place]) 11/17/2020   Long term (current) use of insulin ([place]) 11/17/2020  Obesity 11/17/2020   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/2019   Hip dislocation, right ([place]) 04/13/2019   Other intervertebral disc degeneration, lumbar region 03/30/2019   CAD (coronary artery disease) 01/30/2019   Tobacco abuse 01/30/2019   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   Burn, foot, second degree, left, initial encounter 06/08/2017   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 [place], [place], [address] Phone: [phone]   Fax:  [phone]  Name: Keith Espinoza MRN: [id number] Date of Birth: 07-18-1941

## 2021-09-07 ENCOUNTER — Encounter: Payer: Self-pay | Admitting: Physical Therapy

## 2021-09-07 ENCOUNTER — Other Ambulatory Visit: Payer: Self-pay

## 2021-09-07 ENCOUNTER — Ambulatory Visit (INDEPENDENT_AMBULATORY_CARE_PROVIDER_SITE_OTHER): Payer: Medicare Other | Admitting: Orthopedic Surgery

## 2021-09-07 ENCOUNTER — Ambulatory Visit (INDEPENDENT_AMBULATORY_CARE_PROVIDER_SITE_OTHER): Payer: Medicare Other | Admitting: Physical Therapy

## 2021-09-07 DIAGNOSIS — R2689 Other abnormalities of gait and mobility: Secondary | ICD-10-CM

## 2021-09-07 DIAGNOSIS — R2681 Unsteadiness on feet: Secondary | ICD-10-CM

## 2021-09-07 DIAGNOSIS — Z89511 Acquired absence of right leg below knee: Secondary | ICD-10-CM | POA: Diagnosis not present

## 2021-09-07 DIAGNOSIS — I70299 Other atherosclerosis of native arteries of extremities, unspecified extremity: Secondary | ICD-10-CM

## 2021-09-07 DIAGNOSIS — M6281 Muscle weakness (generalized): Secondary | ICD-10-CM | POA: Diagnosis not present

## 2021-09-07 DIAGNOSIS — L03115 Cellulitis of right lower limb: Secondary | ICD-10-CM

## 2021-09-07 DIAGNOSIS — R293 Abnormal posture: Secondary | ICD-10-CM | POA: Diagnosis not present

## 2021-09-07 DIAGNOSIS — M25661 Stiffness of right knee, not elsewhere classified: Secondary | ICD-10-CM

## 2021-09-07 DIAGNOSIS — L97909 Non-pressure chronic ulcer of unspecified part of unspecified lower leg with unspecified severity: Secondary | ICD-10-CM

## 2021-09-07 NOTE — Therapy (Signed)
[facility] [address] [place], [place], [address] Phone: [phone]   Fax:  [phone]  Physical Therapy Treatment  Patient Details  Name: Keith Espinoza MRN: [id number] Date of Birth: 1941/05/22 Referring Provider (PT): Meridee Score, MD   Encounter Date: 09/07/2021   PT End of Session - 09/07/21 1521     Visit Number 25    Number of Visits 28    Date for PT Re-Evaluation 09/24/21    Authorization Type Medicare A&B and [employer]    Progress Note Due on Visit 30    PT Start Time 1515    PT Stop Time 1600    PT Time Calculation (min) 45 min    Activity Tolerance Patient tolerated treatment well    Behavior During Therapy [facility] for tasks assessed/performed             Past Medical History:  Diagnosis Date   Allergic rhinitis    Allergic rhinitis    Arthritis    Basal cell carcinoma 11/01/2019    bcc left chest treatment [place] cx3 5fu    Chronic leg pain    right   Chronic lower back pain    Coronary artery disease    a. Stenting to RCA 2004; staged DES to LAD and Cx 2004. DES to mRCA 2012. b. DES to mCx, PTCA to dCx 11/2011. c. Lateral wall MI 2013 s/p PTCA to distal Cx & DES to mid OM2 11/2011. d. Low risk nuc 04/2014, EF wnl.   COVID-19    Diabetes mellitus    Insulin dependent   Diabetic neuropathy ([place])    MILD   Diverticulosis    Dysrhythmia    [name] syndrome    Gout    right wrist; right foot; right elbow; have had it since 1970's   H/O hiatal hernia    Heart murmur    History of echocardiogram    aortic sclerosis per echo 12/09 EF 65%, otherwise normal   History of hemorrhoids    BLEEDING   History of kidney stones    h/o   Hypertension    Diagnosed 1995    Myocardial infarction [facility])    Pancreatic pseudocyst    a. s/p remote drainage 2006.   Thrombocytopenia ([place])    Seen on oldest labs in system from 2004   Vitamin B 12 deficiency    orally replaced    Past Surgical History:  Procedure Laterality  Date   ABDOMINAL AORTOGRAM W/LOWER EXTREMITY Bilateral 08/08/2020   Procedure: ABDOMINAL AORTOGRAM W/LOWER EXTREMITY;  Surgeon: Angelia Mould, MD;  Location: [place] CV LAB;  Service: Cardiovascular;  Laterality: Bilateral;   AMPUTATION Right 12/12/2020   Procedure: RIGHT BELOW KNEE AMPUTATION;  Surgeon: [name], MD;  Location: [place];  Service: Orthopedics;  Laterality: Right;   BACK SURGERY     "total of 3 times" S/P fall    CARPAL TUNNEL RELEASE Bilateral    CHOLECYSTECTOMY  1990's   COLONOSCOPY     CORONARY ANGIOPLASTY  11/11/11   CORONARY ANGIOPLASTY WITH STENT PLACEMENT  09/30/2011   "1 then; makes a total of 4"   CORONARY ANGIOPLASTY WITH STENT PLACEMENT  11/11/11   "1; makes a total of 5"   INGUINAL HERNIA REPAIR  2003   right   JOINT REPLACEMENT Right 04/03/2002   hip replacment   KNEE ARTHROSCOPY  1990's   left   LEFT HEART CATHETERIZATION WITH CORONARY ANGIOGRAM N/A 09/30/2011   Procedure: LEFT  HEART CATHETERIZATION WITH CORONARY ANGIOGRAM;  Surgeon: Jettie Booze, MD;  Location: [facility] CATH LAB;  Service: Cardiovascular;  Laterality: N/A;  possible PCI   LEFT HEART CATHETERIZATION WITH CORONARY ANGIOGRAM N/A 11/15/2011   Procedure: LEFT HEART CATHETERIZATION WITH CORONARY ANGIOGRAM;  Surgeon: Jettie Booze, MD;  Location: [facility] CATH LAB;  Service: Cardiovascular;  Laterality: N/A;   PERCUTANEOUS CORONARY STENT INTERVENTION (PCI-S)  09/30/2011   Procedure: PERCUTANEOUS CORONARY STENT INTERVENTION (PCI-S);  Surgeon: Jettie Booze, MD;  Location: [facility] CATH LAB;  Service: Cardiovascular;;   PERCUTANEOUS CORONARY STENT INTERVENTION (PCI-S) N/A 11/11/2011   Procedure: PERCUTANEOUS CORONARY STENT INTERVENTION (PCI-S);  Surgeon: Jettie Booze, MD;  Location: [facility] CATH LAB;  Service: Cardiovascular;  Laterality: N/A;   PERIPHERAL VASCULAR BALLOON ANGIOPLASTY Right 08/08/2020   Procedure: PERIPHERAL VASCULAR BALLOON ANGIOPLASTY;  Surgeon: Angelia Mould,  MD;  Location: [place] [facility] LAB;  Service: Cardiovascular;  Laterality: Right;  Posterior tibial    SHOULDER SURGERY Right    X 2   STUMP REVISION Right 01/09/2021   Procedure: REVISION RIGHT BELOW KNEE AMPUTATION;  Surgeon: [name], MD;  Location: [place];  Service: Orthopedics;  Laterality: Right;   TONSILLECTOMY  ~ [place] Right 04/13/2019   Procedure: RIGHT TOTAL HIP REVISION-POSTERIOR  APPROACH LATERAL;  Surgeon: Marybelle Killings, MD;  Location: [place];  Service: Orthopedics;  Laterality: Right;   TOTAL KNEE ARTHROPLASTY Left 07/23/2016   Procedure: LEFT TOTAL KNEE ARTHROPLASTY;  Surgeon: Marybelle Killings, MD;  Location: [place];  Service: Orthopedics;  Laterality: Left;    There were no vitals filed for this visit.   Subjective Assessment - 09/07/21 1515     Subjective He saw Dr. Sharol Given this morning who is pleased with wound healing thus far.  He also saw Staci Righter, [facility] who added pads to prosthetic socket like PT asked and his bone feels better.    Patient is accompained by: Family member   granddaughter   Pertinent History Rt TTA, arthritis, right hip replacement, left knee arthroplasty, shoulder sg X2, gout, basal cell CA, LBP, back sg X3, IDDM, neuropathy, diverticulosis, heart murmur, HTN, MI,    Limitations Standing;House hold activities;Walking    Patient Stated Goals walk in community & on farm, get on tractor, yard work, drive    Currently in Pain? No/denies    Pain Onset More than a month ago                               [facility] Adult PT Treatment/Exercise - 09/07/21 1515       Ambulation/Gait   Ambulation/Gait Yes    Ambulation/Gait Assistance 5: Supervision    Ambulation/Gait Assistance Details head turns to scan during gait tactile cues for balance reactions.    Assistive device Straight cane;Prosthesis;None    Ambulation Surface Level;Indoor      Neuro Re-ed    Neuro Re-ed Details  balance exercises - gaze stabilization initially  on solid floor then progressed to foam which required minA / tactile cues for balance.  5 cards set up in cross pattern on mirror with gym in reflection for busy background.  1)focus on center A with head turns  2)head forward eye movement right /left to B & C cards.  3)head forward eye movement up/down to D & E cards.  4)move eyes first then head right /left to B & C cards  5)move  eyes first then head up/down to D & E cards.   10 reps ea.  PT progressed to eyes then head in circles CW & CCW BtoDtoCtoEtoB and back.  Then turning feet quarter turn right & left while focusing on center A  Progressed to eye/head turn 90* followed by feet both to right & left.      Prosthetics   Prosthetic Care Comments  PT recommended 2nd opinion on socket / suspension design prior to MD appt ~11/05/2021 for Rx for socket revision.  Pt & dtr verbalized understanding.    Current prosthetic wear tolerance (days/week)  daily    Current prosthetic wear tolerance (#hours/day)  most of awake hours    Current prosthetic weight-bearing tolerance (hours/day)  Pt tolerating weight bearing for 8-10 minutes without c/o pain or limb discomfort.  Pt reported that he had been experiencing some pain upon initiating weight bearing after sitting for a while.  PT recommended lateral weight shifts upon arising before ambulating.  He reported this seemed to help.    Edema none    Residual limb condition  anterior tibia wound with 0.3cm scab only.    Education Provided Other (comment)   see prosthetic care comments & weight bearing tolerance   Person(s) Educated Patient;Child(ren)    Education Method Explanation;Verbal cues    Education Method Verbalized understanding;Verbal cues required;Needs further instruction                       PT Short Term Goals - 09/02/21 1721       PT SHORT TERM GOAL #1   Title Patient's wound is smaller than 0.5cm X 1.0cm with prosthetic wear most of awake hours.    Time 4    Period Weeks     Status Achieved    Target Date 08/25/21      PT SHORT TERM GOAL #2   Title Patient able to ambulate with cane & prosthesis scanning & simple cognitive tasks with loss of balance & maintaining path.    Time 4    Period Weeks    Status Achieved    Target Date 08/25/21               PT Long Term Goals - 07/28/21 1607       PT LONG TERM GOAL #1   Title Patient demonstrates & verbalized understanding of prosthetic care to enable safe utilization of prosthesis.    Time 8    Period Weeks    Status On-going    Target Date 09/24/21      PT LONG TERM GOAL #2   Title Patient tolerates prosthesis wear >90% of awake hours without skin or limb pain issues.    Time 8    Period Weeks    Status On-going    Target Date 09/24/21      PT LONG TERM GOAL #3   Title Berg Balance >/= 45/56 to indicate lower fall risk    Time 8    Period Weeks    Status Revised    Target Date 09/24/21      PT LONG TERM GOAL #4   Title Patient ambulates 500' including uneven terrain with cane & prosthesis modified independent    Time 8    Period Weeks    Status Revised    Target Date 09/24/21      PT LONG TERM GOAL #5   Title Patient negotiates ramps, curbs & stairs with cane & prosthesis modified  independent.    Time 8    Period Weeks    Status On-going    Target Date 09/24/21                   Plan - 09/07/21 1522     Clinical Impression Statement Patient reports less distal limb pain with addition of pretibial pads.  PT progressed gaze stabilization to circular patterns and quarter turns with feet.  He reports less dizziness and less unsteadiness with activities today.    Personal Factors and Comorbidities Comorbidity 3+;Age;Time since onset of injury/illness/exacerbation    Comorbidities Rt TTA, arthritis, right hip replacement, left knee arthroplasty, shoulder sg X2, gout, basal cell CA, LBP, back sg X3, IDDM, neuropathy, diverticulosis, heart murmur, HTN, MI,    Examination-Activity  Limitations Lift;Locomotion Level;Squat;Stairs;Stand;Transfers    Examination-Participation Restrictions Community Activity;Yard Work    [employer]chant [employer] officer Evolving/Moderate complexity    Rehab Potential Good    PT Frequency 1x / week    PT Duration 8 weeks    PT Treatment/Interventions ADLs/Self Care Home Management;DME Instruction;Gait training;Stair training;Functional mobility training;Therapeutic activities;Therapeutic exercise;Balance training;Neuromuscular re-education;Patient/family education;Prosthetic Training;Vestibular;Passive range of motion    PT Next Visit Plan continue to update HEP as frequency is 1x/wk,  balance & gait, check wound, if wound healed check LTGs with possible discharge    Consulted and Agree with Plan of Care Patient             Patient will benefit from skilled therapeutic intervention in order to improve the following deficits and impairments:  Abnormal gait, Decreased activity tolerance, Decreased balance, Decreased endurance, Decreased knowledge of use of DME, Decreased mobility, Decreased range of motion, Decreased skin integrity, Decreased scar mobility, Decreased strength, Increased edema, Impaired flexibility, Postural dysfunction, Prosthetic Dependency  Visit Diagnosis: Stiffness of right knee, not elsewhere classified  Other abnormalities of gait and mobility  Muscle weakness (generalized)  Unsteadiness on feet  Abnormal posture     Problem List Patient Active Problem List   Diagnosis Date Noted   Exudative age-related macular degeneration of left eye with active choroidal neovascularization ([place]) 07/28/2021   Exudative age-related macular degeneration of right eye with active choroidal neovascularization (HCC) 06/01/2021   Intermediate stage nonexudative age-related macular degeneration of both eyes 06/01/2021   Type 2 diabetes mellitus with diabetic polyneuropathy, with long-term current use of insulin (HCC) 03/24/2021    Hypokalemia    Constipation 02/02/2021   Fecal impaction ([place]) 02/01/2021   Wound dehiscence 01/09/2021   Dehiscence of amputation stump ([place])    Status post percutaneous transluminal coronary angioplasty 01/06/2021   Type II diabetes mellitus, uncontrolled 01/06/2021   Acute pancreatitis 01/06/2021   Diabetic peripheral vascular disease ([place]) 01/06/2021   Encounter for screening for other disorder 01/06/2021   Enlarged prostate 01/06/2021   Foot ulcer, right ([place]) 01/06/2021   Gout 01/06/2021   Headache 01/06/2021   Neck pain 01/06/2021   Hypoglycemia 01/06/2021   Loss of appetite 01/06/2021   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   Peripheral neuropathy 01/06/2021   Sciatica 01/06/2021   Vitamin B12 deficiency 01/06/2021   Vitamin D deficiency 01/06/2021   Weakness 01/06/2021   Diabetes mellitus type 2 with neurological manifestations ([place]) 01/06/2021   Protein-calorie malnutrition, severe 12/26/2020   Acute blood loss anemia 12/26/2020   Prerenal azotemia 12/26/2020   Below-knee amputation of right lower extremity ([place]) 12/12/2020   Gangrene of right foot ([place])    Diabetic neuropathy ([place]) 11/17/2020   Hyperglycemia due to type 2 diabetes  mellitus ([place]) 11/17/2020   Long term (current) use of insulin ([place]) 11/17/2020   Obesity 11/17/2020   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/2019   Hip dislocation, right ([place]) 04/13/2019   Other intervertebral disc degeneration, lumbar region 03/30/2019   CAD (coronary artery disease) 01/30/2019   Tobacco abuse 01/30/2019   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   Burn, foot, second degree, left, initial encounter 06/08/2017   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 [place], [place], [address] Phone: [phone]   Fax:  [phone]  Name: Keith Espinoza MRN: [id number] Date of Birth: 23-May-1941

## 2021-09-08 ENCOUNTER — Encounter (INDEPENDENT_AMBULATORY_CARE_PROVIDER_SITE_OTHER): Payer: Medicare Other | Admitting: Ophthalmology

## 2021-09-08 ENCOUNTER — Ambulatory Visit (INDEPENDENT_AMBULATORY_CARE_PROVIDER_SITE_OTHER): Payer: Medicare Other | Admitting: Ophthalmology

## 2021-09-08 ENCOUNTER — Encounter (INDEPENDENT_AMBULATORY_CARE_PROVIDER_SITE_OTHER): Payer: Self-pay | Admitting: Ophthalmology

## 2021-09-08 ENCOUNTER — Encounter: Payer: Self-pay | Admitting: Orthopedic Surgery

## 2021-09-08 DIAGNOSIS — H353211 Exudative age-related macular degeneration, right eye, with active choroidal neovascularization: Secondary | ICD-10-CM

## 2021-09-08 DIAGNOSIS — H353221 Exudative age-related macular degeneration, left eye, with active choroidal neovascularization: Secondary | ICD-10-CM | POA: Diagnosis not present

## 2021-09-08 MED ORDER — AFLIBERCEPT 2MG/0.05ML IZ SOLN FOR KALEIDOSCOPE
2.0000 mg | INTRAVITREAL | Status: AC | PRN
  Administered 2021-09-08: 2 mg via INTRAVITREAL

## 2021-09-08 NOTE — Progress Notes (Signed)
Office Visit Note   Patient: Keith Espinoza           Date of Birth: 05/19/1941           MRN: [id number] Visit Date: 09/07/2021              Requested by: [name], MD [address] [place],  [place] [address] PCP: [name], MD  No chief complaint on file.     HPI: Patient is an 80 year old gentleman who presents in follow-up for end bearing ulcer right transtibial amputation.  Patient was given a short shrinker that he has been wearing under the liner.  Assessment & Plan: Visit Diagnoses: No diagnosis found.  Plan: Patient will follow-up with biotech to modify the socket anticipate he will eventually need a new socket.  Follow-Up Instructions: No follow-ups on file.   Ortho Exam  Patient is alert, oriented, no adenopathy, well-dressed, normal affect, normal respiratory effort. Examination the ulcerative areas are healing nicely there is good wrinkling of the skin there is no drainage no cellulitis.  Imaging: No results found.   Labs: Lab Results  Component Value Date   HGBA1C 7.5 (A) 06/30/2021   HGBA1C 7.5 (H) 02/01/2021   HGBA1C 7.7 (H) 12/12/2020   REPTSTATUS 04/18/2019 FINAL 04/13/2019   GRAMSTAIN  04/13/2019    RARE WBC PRESENT, PREDOMINANTLY PMN NO ORGANISMS SEEN    CULT  04/13/2019    No growth aerobically or anaerobically. Performed at [facility], [place] [address], [place], [place] [address]      Lab Results  Component Value Date   ALBUMIN 3.5 01/31/2021   ALBUMIN 2.9 (L) 12/17/2020   ALBUMIN 4.4 04/10/2019    Lab Results  Component Value Date   MG 2.7 (H) 02/01/2021   No results found for: VD25OH  No results found for: PREALBUMIN CBC EXTENDED Latest Ref Rng & Units 02/03/2021 02/01/2021 01/31/2021  WBC 4.0 - 10.5 K/uL 6.2 9.3 11.6(H)  RBC 4.22 - 5.81 MIL/uL 3.36(L) 3.59(L) 3.88(L)  HGB 13.0 - 17.0 g/dL 9.9(L) 10.6(L) 11.2(L)  HCT 39.0 - 52.0 % 28.9(L) 32.1(L) 35.9(L)  PLT 150 - 400 K/uL 152 171 187  NEUTROABS  1.7 - 7.7 K/uL - - -  LYMPHSABS 0.7 - 4.0 K/uL - - -     There is no height or weight on file to calculate BMI.  Orders:  No orders of the defined types were placed in this encounter.  No orders of the defined types were placed in this encounter.    Procedures: No procedures performed  Clinical Data: No additional findings.  ROS:  All other systems negative, except as noted in the HPI. Review of Systems  Objective: Vital Signs: There were no vitals taken for this visit.  Specialty Comments:  No specialty comments available.  PMFS History: Patient Active Problem List   Diagnosis Date Noted   Exudative age-related macular degeneration of left eye with active choroidal neovascularization ([place]) 07/28/2021   Exudative age-related macular degeneration of right eye with active choroidal neovascularization ([place]) 06/01/2021   Intermediate stage nonexudative age-related macular degeneration of both eyes 06/01/2021   Type 2 diabetes mellitus with diabetic polyneuropathy, with long-term current use of insulin ([place]) 03/24/2021   Hypokalemia    Constipation 02/02/2021   Fecal impaction ([place]) 02/01/2021   Wound dehiscence 01/09/2021   Dehiscence of amputation stump ([place])    Status post percutaneous transluminal coronary angioplasty 01/06/2021   Type II diabetes mellitus, uncontrolled 01/06/2021  Acute pancreatitis 01/06/2021   Diabetic peripheral vascular disease ([place]) 01/06/2021   Encounter for screening for other disorder 01/06/2021   Enlarged prostate 01/06/2021   Foot ulcer, right ([place]) 01/06/2021   Gout 01/06/2021   Headache 01/06/2021   Neck pain 01/06/2021   Hypoglycemia 01/06/2021   Loss of appetite 01/06/2021   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   Peripheral neuropathy 01/06/2021   Sciatica 01/06/2021   Vitamin B12 deficiency 01/06/2021   Vitamin D deficiency 01/06/2021   Weakness 01/06/2021   Diabetes mellitus type 2 with neurological manifestations  ([place]) 01/06/2021   Protein-calorie malnutrition, severe 12/26/2020   Acute blood loss anemia 12/26/2020   Prerenal azotemia 12/26/2020   Below-knee amputation of right lower extremity ([place]) 12/12/2020   Gangrene of right foot ()    Diabetic neuropathy ([place]) 11/17/2020   Hyperglycemia due to type 2 diabetes mellitus ([place]) 11/17/2020   Long term (current) use of insulin ([place]) 11/17/2020   Obesity 11/17/2020   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/2019   Hip dislocation, right ([place]) 04/13/2019   Other intervertebral disc degeneration, lumbar region 03/30/2019   CAD (coronary artery disease) 01/30/2019   Tobacco abuse 01/30/2019   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   Burn, foot, second degree, left, initial encounter 06/08/2017   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 03/16/2017   S/P total knee arthroplasty, left 10/26/2016   Hyperlipidemia 09/04/2014   Thrombocytopenia (HCC)    Precordial chest pain 04/05/2014   Coronary atherosclerosis of native coronary artery 10/01/2013   Other and unspecified hyperlipidemia 10/01/2013   Primary hypertension 10/01/2013   Diabetes mellitus ([place]) 10/01/2013   Esophageal reflux 10/01/2013   Hypertrophy of prostate without urinary obstruction and other lower urinary tract symptoms (LUTS) 10/01/2013   Past Medical History:  Diagnosis Date   Allergic rhinitis    Allergic rhinitis    Arthritis    Basal cell carcinoma 11/01/2019    bcc left chest treatment TX cx3 37fu    Chronic leg pain    right   Chronic lower back pain    Coronary artery disease    a. Stenting to RCA 2004; staged DES to LAD and Cx 2004. DES to mRCA 2012. b. DES to mCx, PTCA to dCx 11/2011. c. Lateral wall MI 2013 s/p PTCA to distal Cx & DES to mid OM2 11/2011. d. Low risk nuc 04/2014, EF wnl.   COVID-19    Diabetes mellitus    Insulin dependent   Diabetic neuropathy ([place])    MILD   Diverticulosis    Dysrhythmia    [name] syndrome    Gout    right wrist;  right foot; right elbow; have had it since 1970's   H/O hiatal hernia    Heart murmur    History of echocardiogram    aortic sclerosis per echo 12/09 EF 65%, otherwise normal   History of hemorrhoids    BLEEDING   History of kidney stones    h/o   Hypertension    Diagnosed 1995    Myocardial infarction [facility])    Pancreatic pseudocyst    a. s/p remote drainage 2006.   Thrombocytopenia ([place])    Seen on oldest labs in system from 2004   Vitamin B 12 deficiency    orally replaced    Family History  Problem Relation Age of Onset   Diabetes Mother    Hyperlipidemia Mother    Hypertension Mother    Cancer Father    Hypertension Father  Diabetes Sister    Hypertension Sister    Cancer Brother    Heart attack Neg Hx     Past Surgical History:  Procedure Laterality Date   ABDOMINAL AORTOGRAM W/LOWER EXTREMITY Bilateral 08/08/2020   Procedure: ABDOMINAL AORTOGRAM W/LOWER EXTREMITY;  Surgeon: Angelia Mould, MD;  Location: [place] [facility];  Service: Cardiovascular;  Laterality: Bilateral;   AMPUTATION Right 12/12/2020   Procedure: RIGHT BELOW KNEE AMPUTATION;  Surgeon: [name], MD;  Location: [place];  Service: Orthopedics;  Laterality: Right;   BACK SURGERY     "total of 3 times" S/P fall    CARPAL TUNNEL RELEASE Bilateral    CHOLECYSTECTOMY  1990's   COLONOSCOPY     CORONARY ANGIOPLASTY  11/11/11   CORONARY ANGIOPLASTY WITH STENT PLACEMENT  09/30/2011   "1 then; makes a total of 4"   CORONARY ANGIOPLASTY WITH STENT PLACEMENT  11/11/11   "1; makes a total of 5"   INGUINAL HERNIA REPAIR  2003   right   JOINT REPLACEMENT Right 04/03/2002   hip replacment   KNEE ARTHROSCOPY  1990's   left   LEFT HEART CATHETERIZATION WITH CORONARY ANGIOGRAM N/A 09/30/2011   Procedure: LEFT HEART CATHETERIZATION WITH CORONARY ANGIOGRAM;  Surgeon: Jettie Booze, MD;  Location[facility] CATH LAB;  Service: Cardiovascular;  Laterality: N/A;  possible PCI   LEFT HEART CATHETERIZATION WITH  CORONARY ANGIOGRAM N/A 11/15/2011   Procedure: LEFT HEART CATHETERIZATION WITH CORONARY ANGIOGRAM;  Surgeon: Jettie Booze, MD;  Location: [facility] CATH LAB;  Service: Cardiovascular;  Laterality: N/A;   PERCUTANEOUS CORONARY STENT INTERVENTION (PCI-S)  09/30/2011   Procedure: PERCUTANEOUS CORONARY STENT INTERVENTION (PCI-S);  Surgeon: Jettie Booze, MD;  Location: [facility] CATH LAB;  Service: Cardiovascular;;   PERCUTANEOUS CORONARY STENT INTERVENTION (PCI-S) N/A 11/11/2011   Procedure: PERCUTANEOUS CORONARY STENT INTERVENTION (PCI-S);  Surgeon: Jettie Booze, MD;  Location: [facility] CATH LAB;  Service: Cardiovascular;  Laterality: N/A;   PERIPHERAL VASCULAR BALLOON ANGIOPLASTY Right 08/08/2020   Procedure: PERIPHERAL VASCULAR BALLOON ANGIOPLASTY;  Surgeon: Angelia Mould, MD;  Location: [place] [facility] LAB;  Service: Cardiovascular;  Laterality: Right;  Posterior tibial    SHOULDER SURGERY Right    X 2   STUMP REVISION Right 01/09/2021   Procedure: REVISION RIGHT BELOW KNEE AMPUTATION;  Surgeon: [name], MD;  Location: [place];  Service: Orthopedics;  Laterality: Right;   TONSILLECTOMY  ~ [place] Right 04/13/2019   Procedure: RIGHT TOTAL HIP REVISION-POSTERIOR  APPROACH LATERAL;  Surgeon: Marybelle Killings, MD;  Location: [place];  Service: Orthopedics;  Laterality: Right;   TOTAL KNEE ARTHROPLASTY Left 07/23/2016   Procedure: LEFT TOTAL KNEE ARTHROPLASTY;  Surgeon: Marybelle Killings, MD;  Location: [place];  Service: Orthopedics;  Laterality: Left;   Social History   Occupational History   Occupation: Retired  Tobacco Use   Smoking status: Former   Smokeless tobacco: Current    Types: Chew   Tobacco comments:    quit 60 years ago  Vaping Use   Vaping Use: Never used  Substance and Sexual Activity   Alcohol use: No   Drug use: No   Sexual activity: Not Currently

## 2021-09-08 NOTE — Assessment & Plan Note (Signed)
Overall improved acuity OD follow-up as scheduled next

## 2021-09-08 NOTE — Assessment & Plan Note (Signed)
OS today with 6-week follow-up.  And improved acuity, repeat injection Avastin today

## 2021-09-08 NOTE — Progress Notes (Signed)
09/08/2021     CHIEF COMPLAINT Patient presents for  Chief Complaint  Patient presents with   Macular Degeneration      HISTORY OF PRESENT ILLNESS: Keith Espinoza is a 80 y.o. male who presents to the clinic today for:   HPI   OD at 5 weeks post most recent evaluation and injection of antivegF, OS at 6 weeks post most recent  evaluation and injection of antivegF with stability of vision in each eye reported Last edited by Hurman Horn, MD on 09/08/2021  1:55 PM.      Referring physician: [name], MD [address] [place],  [place] [address]  HISTORICAL INFORMATION:   Selected notes from the MEDICAL RECORD NUMBER    Lab Results  Component Value Date   HGBA1C 7.5 (A) 06/30/2021     CURRENT MEDICATIONS: No current outpatient medications on file. (Ophthalmic Drugs)   No current facility-administered medications for this visit. (Ophthalmic Drugs)   Current Outpatient Medications (Other)  Medication Sig   acetaminophen (TYLENOL) 500 MG tablet Take 1,000 mg by mouth every 6 (six) hours as needed (pain).   allopurinol (ZYLOPRIM) 300 MG tablet Take 300 mg by mouth daily.   amLODipine (NORVASC) 5 MG tablet Take 1 tablet (5 mg total) by mouth daily.   aspirin EC 81 MG tablet Take 81 mg by mouth daily.   Cholecalciferol (VITAMIN D3) 50 MCG (2000 UT) TABS Take 2,000 Units by mouth daily.    clopidogrel (PLAVIX) 75 MG tablet Take 1 tablet (75 mg total) by mouth daily.   Continuous Blood Gluc Receiver (DEXCOM G6 RECEIVER) DEVI 1 Device by Does not apply route as directed.   Continuous Blood Gluc Sensor (DEXCOM G6 SENSOR) MISC 1 Device by Does not apply route as directed.   Continuous Blood Gluc Transmit (DEXCOM G6 TRANSMITTER) MISC 1 Device by Does not apply route as directed.   Cyanocobalamin (B-12) 2500 MCG TABS Take 2,500 mcg by mouth daily.   empagliflozin (JARDIANCE) 25 MG TABS tablet Take 1 tablet (25 mg total) by mouth daily.   gabapentin (NEURONTIN)  100 MG capsule Take 2 capsules (200 mg total) by mouth at bedtime. (Patient taking differently: Take 600 mg by mouth at bedtime.)   gabapentin (NEURONTIN) 400 MG capsule Take 1 capsule (400 mg total) by mouth 3 (three) times daily. (Patient taking differently: Take 400 mg by mouth daily.)   hydrochlorothiazide (HYDRODIURIL) 12.5 MG tablet Take 1 tablet (12.5 mg total) by mouth daily.   insulin degludec (TRESIBA FLEXTOUCH) 100 UNIT/ML FlexTouch Pen Inject 9 Units into the skin daily.   Insulin Pen Needle 32G X 4 MM MISC 1 Device by Does not apply route daily in the afternoon.   isosorbide mononitrate (IMDUR) 30 MG 24 hr tablet Take 1 tablet (30 mg total) by mouth daily. Please schedule yearly appointment for future refills. Thank you   linaclotide (LINZESS) 145 MCG CAPS capsule Take 1 capsule (145 mcg total) by mouth daily before breakfast.   losartan (COZAAR) 50 MG tablet Take 1 tablet (50 mg total) by mouth daily.   metFORMIN (GLUCOPHAGE) 1000 MG tablet Take 1 tablet (1,000 mg total) by mouth 2 (two) times daily.   methocarbamol (ROBAXIN) 500 MG tablet Take 1 tablet (500 mg total) by mouth every 6 (six) hours as needed for muscle spasms.   nitroGLYCERIN (NITROSTAT) 0.4 MG SL tablet Place 0.4 mg under the tongue every 5 (five) minutes x 3 doses as needed for chest  pain.    ONE TOUCH ULTRA TEST test strip CHECK BLOOD SUGAR ONCE DAILY AS DIRECTED   oxyCODONE (OXY IR/ROXICODONE) 5 MG immediate release tablet Take 1 tablet (5 mg total) by mouth every 4 (four) hours as needed for severe pain.   polyethylene glycol (MIRALAX) 17 g packet Take 17 g by mouth 2 (two) times daily.   potassium chloride SA (KLOR-CON) 20 MEQ tablet Take 1 tablet (20 mEq total) by mouth daily.   senna-docusate (SENOKOT-S) 8.6-50 MG tablet Take 1 tablet by mouth at bedtime.   terazosin (HYTRIN) 5 MG capsule Take 1 capsule (5 mg total) by mouth at bedtime.   traMADol (ULTRAM) 50 MG tablet Take 1 tablet (50 mg total) by mouth every 6  (six) hours as needed for moderate pain.   traZODone (DESYREL) 50 MG tablet Take 1 tablet (50 mg total) by mouth at bedtime as needed for sleep. (Patient taking differently: Take 50 mg by mouth 2 (two) times daily.)   No current facility-administered medications for this visit. (Other)      REVIEW OF SYSTEMS: ROS   Negative for: Constitutional, Gastrointestinal, Neurological, Skin, Genitourinary, Musculoskeletal, HENT, Endocrine, Cardiovascular, Eyes, Respiratory, Psychiatric, Allergic/Imm, Heme/Lymph Last edited by Hurman Horn, MD on 09/08/2021  1:55 PM.       ALLERGIES Allergies  Allergen Reactions   Simvastatin Other (See Comments)    SEVERE MYALGIAS    Zetia [Ezetimibe] Other (See Comments)    MYALGIAS   Dilaudid [Hydromorphone Hcl] Other (See Comments)    hallucination    PAST MEDICAL HISTORY Past Medical History:  Diagnosis Date   Allergic rhinitis    Allergic rhinitis    Arthritis    Basal cell carcinoma 11/01/2019    bcc left chest treatment TX [id number] [id number]    Chronic leg pain    right   Chronic lower back pain    Coronary artery disease    a. Stenting to RCA 2004; staged DES to LAD and Cx 2004. DES to mRCA 2012. b. DES to mCx, PTCA to dCx 11/2011. c. Lateral wall MI 2013 s/p PTCA to distal Cx & DES to mid OM2 11/2011. d. Low risk nuc 04/2014, EF wnl.   COVID-19    Diabetes mellitus    Insulin dependent   Diabetic neuropathy ([place])    MILD   Diverticulosis    Dysrhythmia    [name] syndrome    Gout    right wrist; right foot; right elbow; have had it since 1970's   H/O hiatal hernia    Heart murmur    History of echocardiogram    aortic sclerosis per echo 12/09 EF 65%, otherwise normal   History of hemorrhoids    BLEEDING   History of kidney stones    h/o   Hypertension    Diagnosed 1995    Myocardial infarction [facility])    Pancreatic pseudocyst    a. s/p remote drainage 2006.   Thrombocytopenia ([place])    Seen on oldest labs in system from 2004   Vitamin  B 12 deficiency    orally replaced   Past Surgical History:  Procedure Laterality Date   ABDOMINAL AORTOGRAM W/LOWER EXTREMITY Bilateral 08/08/2020   Procedure: ABDOMINAL AORTOGRAM W/LOWER EXTREMITY;  Surgeon: Angelia Mould, MD;  Location: [facility];  Service: Cardiovascular;  Laterality: Bilateral;   AMPUTATION Right 12/12/2020   Procedure: RIGHT BELOW KNEE AMPUTATION;  Surgeon: [name], MD;  Location: [place];  Service: Orthopedics;  Laterality: Right;  BACK SURGERY     "total of 3 times" S/P fall    CARPAL TUNNEL RELEASE Bilateral    CHOLECYSTECTOMY  1990's   COLONOSCOPY     CORONARY ANGIOPLASTY  11/11/11   CORONARY ANGIOPLASTY WITH STENT PLACEMENT  09/30/2011   "1 then; makes a total of 4"   CORONARY ANGIOPLASTY WITH STENT PLACEMENT  11/11/11   "1; makes a total of 5"   INGUINAL HERNIA REPAIR  2003   right   JOINT REPLACEMENT Right 04/03/2002   hip replacment   KNEE ARTHROSCOPY  1990's   left   LEFT HEART CATHETERIZATION WITH CORONARY ANGIOGRAM N/A 09/30/2011   Procedure: LEFT HEART CATHETERIZATION WITH CORONARY ANGIOGRAM;  Surgeon: Jettie Booze, MD;  Location: [facility] CATH LAB;  Service: Cardiovascular;  Laterality: N/A;  possible PCI   LEFT HEART CATHETERIZATION WITH CORONARY ANGIOGRAM N/A 11/15/2011   Procedure: LEFT HEART CATHETERIZATION WITH CORONARY ANGIOGRAM;  Surgeon: Jettie Booze, MD;  Location: [facility] CATH LAB;  Service: Cardiovascular;  Laterality: N/A;   PERCUTANEOUS CORONARY STENT INTERVENTION (PCI-S)  09/30/2011   Procedure: PERCUTANEOUS CORONARY STENT INTERVENTION (PCI-S);  Surgeon: Jettie Booze, MD;  Location: [facility] CATH LAB;  Service: Cardiovascular;;   PERCUTANEOUS CORONARY STENT INTERVENTION (PCI-S) N/A 11/11/2011   Procedure: PERCUTANEOUS CORONARY STENT INTERVENTION (PCI-S);  Surgeon: Jettie Booze, MD;  Location: [facility] CATH LAB;  Service: Cardiovascular;  Laterality: N/A;   PERIPHERAL VASCULAR BALLOON ANGIOPLASTY Right 08/08/2020    Procedure: PERIPHERAL VASCULAR BALLOON ANGIOPLASTY;  Surgeon: Angelia Mould, MD;  Location: [place] [facility];  Service: Cardiovascular;  Laterality: Right;  Posterior tibial    SHOULDER SURGERY Right    X 2   STUMP REVISION Right 01/09/2021   Procedure: REVISION RIGHT BELOW KNEE AMPUTATION;  Surgeon: [name], MD;  Location: [place];  Service: Orthopedics;  Laterality: Right;   TONSILLECTOMY  ~ [place] Right 04/13/2019   Procedure: RIGHT TOTAL HIP REVISION-POSTERIOR  APPROACH LATERAL;  Surgeon: Marybelle Killings, MD;  Location: [place];  Service: Orthopedics;  Laterality: Right;   TOTAL KNEE ARTHROPLASTY Left 07/23/2016   Procedure: LEFT TOTAL KNEE ARTHROPLASTY;  Surgeon: Marybelle Killings, MD;  Location: [place];  Service: Orthopedics;  Laterality: Left;    FAMILY HISTORY Family History  Problem Relation Age of Onset   Diabetes Mother    Hyperlipidemia Mother    Hypertension Mother    Cancer Father    Hypertension Father    Diabetes Sister    Hypertension Sister    Cancer Brother    Heart attack Neg Hx     SOCIAL HISTORY Social History   Tobacco Use   Smoking status: Former   Smokeless tobacco: Current    Types: Chew   Tobacco comments:    quit 60 years ago  Vaping Use   Vaping Use: Never used  Substance Use Topics   Alcohol use: No   Drug use: No         OPHTHALMIC EXAM:  Base Eye Exam     Visual Acuity (ETDRS)       Right Left   Dist cc 20/25 20/25 -1   Dist ph cc NI NI         Tonometry (Tonopen, 1:59 PM)       Right Left   Pressure 10 11         Pupils       Pupils APD   Right PERRL None   Left  PERRL None         Visual Fields       Left Right    Full Full         Extraocular Movement       Right Left    Full, Ortho Full, Ortho         Neuro/Psych     Oriented x3: Yes   Mood/Affect: Normal         Dilation     Left eye:            Slit Lamp and Fundus Exam     External Exam        Right Left   External Normal Normal         Slit Lamp Exam       Right Left   Lids/Lashes Normal Normal   Conjunctiva/Sclera White and quiet White and quiet   Cornea Clear Clear   Anterior Chamber Deep and quiet Deep and quiet   Iris Round and reactive Round and reactive   Lens Centered posterior chamber intraocular lens Centered posterior chamber intraocular lens   Anterior Vitreous Normal Normal         Fundus Exam       Right Left   Posterior Vitreous  Normal   Disc  Normal   C/D Ratio 2.5 2.5   Macula  Retinal pigment epithelial mottling, Intermediate age related macular degeneration   Vessels  Normal, no DR   Periphery  Normal            IMAGING AND PROCEDURES  Imaging and Procedures for 09/08/21  OCT, Retina - OU - Both Eyes       Right Eye Quality was borderline. Scan locations included subfoveal. Central Foveal Thickness: 254. Progression has worsened. Findings include abnormal foveal contour, subretinal scarring, inner retinal atrophy, intraretinal hyper-reflective material, intraretinal fluid.   Left Eye Quality was borderline. Scan locations included subfoveal. Central Foveal Thickness: 244. Progression has been stable. Findings include abnormal foveal contour, retinal drusen , intraretinal hyper-reflective material, intraretinal fluid.   Notes OD much vastly improved OD post injection Avastin.  Currently at 5 weeks.  Follow-up as scheduled.  OS, macular findings also vastly improved currently at 6-week follow-up.  Repeat injection Avastin OS today and extend interval examination left eye to 7 weeks     Intravitreal Injection, Pharmacologic Agent - OS - Left Eye       Time Out 09/08/2021. 1:56 PM. Confirmed correct patient, procedure, site, and patient consented.   Anesthesia Topical anesthesia was used. Anesthetic medications included Lidocaine 4%.   Procedure Preparation included 5% betadine to ocular surface, 10% betadine to eyelids,  Tobramycin 0.3%. A 30 gauge needle was used.   Injection: 2 mg aflibercept 2 MG/0.05ML   Route: Intravitreal, Site: Left Eye   NDC: [id number], Lot: [id number], Waste: 0 mL   Post-op Post injection exam found visual acuity of at least counting fingers. The patient tolerated the procedure well. There were no complications. The patient received written and verbal post procedure care education. Post injection medications included ocuflox.              ASSESSMENT/PLAN:  Exudative age-related macular degeneration of right eye with active choroidal neovascularization (HCC) Overall improved acuity OD follow-up as scheduled next  Exudative age-related macular degeneration of left eye with active choroidal neovascularization ([place]) OS today with 6-week follow-up.  And improved acuity, repeat injection Avastin today     ICD-10-CM   1. Exudative  age-related macular degeneration of left eye with active choroidal neovascularization (HCC)  H35.3221 OCT, Retina - OU - Both Eyes    Intravitreal Injection, Pharmacologic Agent - OS - Left Eye    aflibercept (EYLEA) SOLN 2 mg    2. Exudative age-related macular degeneration of right eye with active choroidal neovascularization ([place])  H35.3211       1.  OD vastly improved macular anatomy follow-up as scheduled within 1 week  2.  OS also improved acuity and stabilize macular findings currently at 6-week interval post Avastin.  Repeat injection today left eye And reexamination left eye in 7 weeks  3.  Ophthalmic Meds Ordered this visit:  Meds ordered this encounter  Medications   aflibercept (EYLEA) SOLN 2 mg       Return in about 7 weeks (around 10/27/2021) for dilate, OS, AVASTIN OCT,, and follow-up OD as scheduled soon.  There are no Patient Instructions on file for this visit.   Explained the diagnoses, plan, and follow up with the patient and they expressed understanding.  Patient expressed understanding of the importance of  proper follow up care.   Clent Demark Valerya Maxton M.D. Diseases & Surgery of the Retina and Vitreous Retina & Diabetic [place] 09/08/21     Abbreviations: M myopia (nearsighted); A astigmatism; H hyperopia (farsighted); P presbyopia; Mrx spectacle prescription;  CTL contact lenses; OD right eye; OS left eye; OU both eyes  XT exotropia; ET esotropia; PEK punctate epithelial keratitis; PEE punctate epithelial erosions; DES dry eye syndrome; MGD meibomian gland dysfunction; ATs artificial tears; PFAT's preservative free artificial tears; [place] nuclear sclerotic cataract; PSC posterior subcapsular cataract; ERM epi-retinal membrane; PVD posterior vitreous detachment; RD retinal detachment; DM diabetes mellitus; DR diabetic retinopathy; NPDR non-proliferative diabetic retinopathy; PDR proliferative diabetic retinopathy; CSME clinically significant macular edema; DME diabetic macular edema; dbh dot blot hemorrhages; CWS cotton wool spot; POAG primary open angle glaucoma; C/D cup-to-disc ratio; HVF humphrey visual field; GVF goldmann visual field; OCT optical coherence tomography; IOP intraocular pressure; BRVO Branch retinal vein occlusion; CRVO central retinal vein occlusion; CRAO central retinal artery occlusion; BRAO branch retinal artery occlusion; RT retinal tear; SB scleral buckle; PPV pars plana vitrectomy; VH Vitreous hemorrhage; PRP panretinal laser photocoagulation; IVK intravitreal kenalog; VMT vitreomacular traction; MH Macular hole;  NVD neovascularization of the disc; NVE neovascularization elsewhere; AREDS age related eye disease study; ARMD age related macular degeneration; POAG primary open angle glaucoma; EBMD epithelial/anterior basement membrane dystrophy; ACIOL anterior chamber intraocular lens; IOL intraocular lens; PCIOL posterior chamber intraocular lens; Phaco/IOL phacoemulsification with intraocular lens placement; [place] photorefractive keratectomy; LASIK laser assisted in situ keratomileusis;  HTN hypertension; DM diabetes mellitus; COPD chronic obstructive pulmonary disease

## 2021-09-10 ENCOUNTER — Encounter (INDEPENDENT_AMBULATORY_CARE_PROVIDER_SITE_OTHER): Payer: Medicare Other | Admitting: Ophthalmology

## 2021-09-16 ENCOUNTER — Encounter: Payer: Self-pay | Admitting: Physical Therapy

## 2021-09-16 ENCOUNTER — Ambulatory Visit (INDEPENDENT_AMBULATORY_CARE_PROVIDER_SITE_OTHER): Payer: Medicare Other | Admitting: Physical Therapy

## 2021-09-16 ENCOUNTER — Other Ambulatory Visit: Payer: Self-pay

## 2021-09-16 DIAGNOSIS — M6281 Muscle weakness (generalized): Secondary | ICD-10-CM | POA: Diagnosis not present

## 2021-09-16 DIAGNOSIS — R293 Abnormal posture: Secondary | ICD-10-CM | POA: Diagnosis not present

## 2021-09-16 DIAGNOSIS — R2689 Other abnormalities of gait and mobility: Secondary | ICD-10-CM

## 2021-09-16 DIAGNOSIS — M25661 Stiffness of right knee, not elsewhere classified: Secondary | ICD-10-CM

## 2021-09-16 DIAGNOSIS — R2681 Unsteadiness on feet: Secondary | ICD-10-CM | POA: Diagnosis not present

## 2021-09-16 NOTE — Therapy (Signed)
[facility] [address] [place], [place], [address] Phone: [phone]   Fax:  [phone]  Physical Therapy Treatment & Discharge Summary  Patient Details  Name: Keith Espinoza MRN: [id number] Date of Birth: 25-Feb-1941 Referring Provider (PT): Meridee Score, MD   Encounter Date: 09/16/2021  PHYSICAL THERAPY DISCHARGE SUMMARY  Visits from Start of Care: 26  Current functional level related to goals / functional outcomes: See below   Remaining deficits: See below   Education / Equipment: Prosthetic care & HEP   Patient agrees to discharge. Patient goals were met. Patient is being discharged due to meeting the stated rehab goals.    PT End of Session - 09/16/21 1527     Visit Number 26    Number of Visits 28    Date for PT Re-Evaluation 09/24/21    Authorization Type Medicare [employer] and [employer]    Progress Note Due on Visit 30    PT Start Time 1515    PT Stop Time 1600    PT Time Calculation (min) 45 min    Activity Tolerance Patient tolerated treatment well    Behavior During Therapy WFL for tasks assessed/performed             Past Medical History:  Diagnosis Date   Allergic rhinitis    Allergic rhinitis    Arthritis    Basal cell carcinoma 11/01/2019    bcc left chest treatment TX cx3 15f    Chronic leg pain    right   Chronic lower back pain    Coronary artery disease    a. Stenting to RCA 2004; staged DES to LAD and Cx 2004. DES to mRCA 2012. b. DES to mCx, PTCA to dCx 11/2011. c. Lateral wall MI 2013 s/p PTCA to distal Cx & DES to mid OM2 11/2011. d. Low risk nuc 04/2014, EF wnl.   COVID-19    Diabetes mellitus    Insulin dependent   Diabetic neuropathy ([place]    MILD   Diverticulosis    Dysrhythmia    GRosanna Randysyndrome    Gout    right wrist; right foot; right elbow; have had it since 1970's   H/O hiatal hernia    Heart murmur    History of echocardiogram    aortic sclerosis per echo 12/09 EF 65%,  otherwise normal   History of hemorrhoids    BLEEDING   History of kidney stones    h/o   Hypertension    Diagnosed 1995    Myocardial infarction ([facility]    Pancreatic pseudocyst    a. s/p remote drainage 2006.   [place]rombocytopenia ([place]    Seen on oldest labs in system from 2004   Vitamin B 12 deficiency    orally replaced    Past Surgical History:  Procedure Laterality Date   ABDOMINAL AORTOGRAM W/LOWER EXTREMITY Bilateral 08/08/2020   Procedure: ABDOMINAL AORTOGRAM W/LOWER EXTREMITY;  Surgeon: DAngelia Mould MD;  [facility];  Service: Cardiovascular;  Laterality: Bilateral;   AMPUTATION Right 12/12/2020   Procedure: RIGHT BELOW KNEE AMPUTATION;  Surgeon: DNewt Minion MD;  [facility]  Service: Orthopedics;  Laterality: Right;   BACK SURGERY     "total of 3 times" S/P fall    CARPAL TUNNEL RELEASE Bilateral    CHOLECYSTECTOMY  1990's   COLONOSCOPY     CORONARY ANGIOPLASTY  11/11/11   CORONARY ANGIOPLASTY WITH STENT PLACEMENT  09/30/2011   "1 then; makes  a total of 4"   CORONARY ANGIOPLASTY WITH STENT PLACEMENT  11/11/11   "1; makes a total of 5"   INGUINAL HERNIA REPAIR  2003   right   JOINT REPLACEMENT Right 04/03/2002   hip replacment   KNEE ARTHROSCOPY  1990's   left   LEFT HEART CATHETERIZATION WITH CORONARY ANGIOGRAM N/A 09/30/2011   Procedure: LEFT HEART CATHETERIZATION WITH CORONARY ANGIOGRAM;  Surgeon: Jettie Booze, MD;  Location: [facility] CATH LAB;  Service: Cardiovascular;  Laterality: N/A;  possible PCI   LEFT HEART CATHETERIZATION WITH CORONARY ANGIOGRAM N/A 11/15/2011   Procedure: LEFT HEART CATHETERIZATION WITH CORONARY ANGIOGRAM;  Surgeon: Jettie Booze, MD;  Location: [facility] CATH LAB;  Service: Cardiovascular;  Laterality: N/A;   PERCUTANEOUS CORONARY STENT INTERVENTION (PCI-S)  09/30/2011   Procedure: PERCUTANEOUS CORONARY STENT INTERVENTION (PCI-S);  Surgeon: Jettie Booze, MD;  Location: [facility] CATH LAB;  Service:  Cardiovascular;;   PERCUTANEOUS CORONARY STENT INTERVENTION (PCI-S) N/A 11/11/2011   Procedure: PERCUTANEOUS CORONARY STENT INTERVENTION (PCI-S);  Surgeon: Jettie Booze, MD;  Location: [facility] CATH LAB;  Service: Cardiovascular;  Laterality: N/A;   PERIPHERAL VASCULAR BALLOON ANGIOPLASTY Right 08/08/2020   Procedure: PERIPHERAL VASCULAR BALLOON ANGIOPLASTY;  Surgeon: Angelia Mould, MD;  Location: [place] [facility];  Service: Cardiovascular;  Laterality: Right;  Posterior tibial    SHOULDER SURGERY Right    X 2   STUMP REVISION Right 01/09/2021   Procedure: REVISION RIGHT BELOW KNEE AMPUTATION;  Surgeon: [name], MD;  Location: [place];  Service: Orthopedics;  Laterality: Right;   TONSILLECTOMY  ~ [place] Right 04/13/2019   Procedure: RIGHT TOTAL HIP REVISION-POSTERIOR  APPROACH LATERAL;  Surgeon: Marybelle Killings, MD;  Location: [place];  Service: Orthopedics;  Laterality: Right;   TOTAL KNEE ARTHROPLASTY Left 07/23/2016   Procedure: LEFT TOTAL KNEE ARTHROPLASTY;  Surgeon: Marybelle Killings, MD;  Location: [place];  Service: Orthopedics;  Laterality: Left;    There were no vitals filed for this visit.   Subjective Assessment - 09/16/21 1515     Subjective His wound continues to heal slowly,  His residual limb is not hurting now since prosthetist put pads in socket.    Patient is accompained by: Family member   granddaughter   Pertinent History Rt TTA, arthritis, right hip replacement, left knee arthroplasty, shoulder sg X2, gout, basal cell CA, LBP, back sg X3, IDDM, neuropathy, diverticulosis, heart murmur, HTN, MI,    Limitations Standing;House hold activities;Walking    Patient Stated Goals walk in community & on farm, get on tractor, yard work, drive    Currently in Pain? No/denies    Pain Onset More than a month ago                [facility] PT Assessment - 09/16/21 1515       Assessment   Medical Diagnosis Right Transtibial Amputation    Referring Provider  (PT) Meridee Score, MD    Onset Date/Surgical Date 05/05/21   prosthesis delivery     Berg Balance Test   Sit to Stand Able to stand without using hands and stabilize independently    Standing Unsupported Able to stand safely 2 minutes    Sitting with Back Unsupported but Feet Supported on Floor or Stool Able to sit safely and securely 2 minutes    Stand to Sit Sits safely with minimal use of hands    Transfers Able to transfer safely, minor use of hands  Standing Unsupported with Eyes Closed Able to stand 10 seconds safely    Standing Unsupported with Feet Together Able to place feet together independently and stand 1 minute safely    From Standing, Reach Forward with Outstretched Arm Can reach confidently >25 cm (10")    From Standing Position, Pick up Object from [place] to pick up shoe safely and easily    From Standing Position, Turn to Look Behind Over each Shoulder Looks behind from both sides and weight shifts well    Turn 360 Degrees Able to turn 360 degrees safely but slowly    Standing Unsupported, Alternately Place Feet on Step/Stool Able to stand independently and complete 8 steps >20 seconds    Standing Unsupported, One Foot in Front Able to plae foot ahead of the other independently and hold 30 seconds    Standing on One Leg Tries to lift leg/unable to hold 3 seconds but remains standing independently    Total Score 49    Berg comment: initially was 16/56                           [facility] Adult PT Treatment/Exercise - 09/16/21 1515       Ambulation/Gait   Ambulation/Gait Yes    Ambulation/Gait Assistance 5: Supervision    Ambulation Distance (Feet) 500 Feet   pt reports >500' around home with cane without issues.   Assistive device Straight cane;Prosthesis;None    Ramp 6: Modified independent (Device)   cane stand alone tip & TTA prosthesis   Curb 6: Modified independent (Device/increase time)   cane stand alone tip & TTA prosthesis     Neuro Re-ed     Neuro Re-ed Details  balance exercises - gaze stabilization initially on solid floor then progressed to foam which required minA / tactile cues for balance.  5 cards set up in cross pattern on mirror with gym in reflection for busy background.  1)focus on center A with head turns  2)head forward eye movement right /left to B & C cards.  3)head forward eye movement up/down to D & E cards.  4)move eyes first then head right /left to B & C cards  5)move eyes first then head up/down to D & E cards.   10 reps ea.  PT progressed to eyes then head in circles CW & CCW [id number] and back.  Then turning feet quarter turn right & left while focusing on center A  Progressed to eye/head turn 90* followed by feet both to right & left.      Prosthetics   Prosthetic Care Comments  another pt who has TTA prosthesis whose prosthesis is silicon liner interface without pin in interface liner, suction pin in flexible socket imbedded in sleeve suspension. He showed Mr. Gobert his prosthesis with discussion of design differences and PT educated that not have hard umbrella at distal tibia would probably help his limb pain issues.    Pt requested discharge today.  PT recommended setting up appointment with Dr. Sharol Given if his wound starts to get bigger or look infected. His daughter & he verbalized understanding.    Current prosthetic wear tolerance (days/week)  daily    Current prosthetic wear tolerance (#hours/day)  most of awake hours    Current prosthetic weight-bearing tolerance (hours/day)  Pt tolerating weight bearing for 8-10 minutes without c/o pain or limb discomfort.    Edema none    Residual limb condition  anterior tibia  wound with 0.2cm scab only and dry scab surrounding for 0.5cm. No signs of infection.    Education Provided Other (comment)   see prosthetic care comments & weight bearing tolerance   Person(s) Educated Patient;Child(ren)    Education Method Explanation;Verbal cues    Education Method Verbalized  understanding    Donning Prosthesis Modified independent (device/increased time)                       PT Short Term Goals - 09/02/21 1721       PT SHORT TERM GOAL #1   Title Patient's wound is smaller than 0.5cm X 1.0cm with prosthetic wear most of awake hours.    Time 4    Period Weeks    Status Achieved    Target Date 08/25/21      PT SHORT TERM GOAL #2   Title Patient able to ambulate with cane & prosthesis scanning & simple cognitive tasks with loss of balance & maintaining path.    Time 4    Period Weeks    Status Achieved    Target Date 08/25/21               PT Long Term Goals - 09/16/21 1636       PT LONG TERM GOAL #1   Title Patient demonstrates & verbalized understanding of prosthetic care to enable safe utilization of prosthesis.    Time 8    Period Weeks    Status Achieved    Target Date 09/24/21      PT LONG TERM GOAL #2   Title Patient tolerates prosthesis wear >90% of awake hours without skin or limb pain issues.    Time 8    Period Weeks    Status Partially Met    Target Date 09/24/21      PT LONG TERM GOAL #3   Title Berg Balance >/= 45/56 to indicate lower fall risk    Time 8    Period Weeks    Status Achieved    Target Date 09/24/21      PT LONG TERM GOAL #4   Title Patient ambulates 500' including uneven terrain with cane & prosthesis modified independent    Time 8    Period Weeks    Status Achieved    Target Date 09/24/21      PT LONG TERM GOAL #5   Title Patient negotiates ramps, curbs & stairs with cane & prosthesis modified independent.    Time 8    Period Weeks    Status Achieved    Target Date 09/24/21                   Plan - 09/16/21 1527     Clinical Impression Statement Patient met all LTGs. He has a very small scab present on anterior tibia still. He appears able to safely monitor it until scab comes off which should be in next few weeks.  He appears to be safely functioning with his  prosthesis  in his home without device & in community with a cane.  He has distal tibia tenderness and appears would be an excellent candidate for sleeve suspension system when he is ready for socket revision around early February. This system would not have hard end piece with silicon liner interface with shuttle pin suspension.    Personal Factors and Comorbidities Comorbidity 3+;Age;Time since onset of injury/illness/exacerbation    Comorbidities Rt TTA, arthritis, right hip replacement, left  knee arthroplasty, shoulder sg X2, gout, basal cell CA, LBP, back sg X3, IDDM, neuropathy, diverticulosis, heart murmur, HTN, MI,    Examination-Activity Limitations Lift;Locomotion Level;Squat;Stairs;Stand;Transfers    Examination-Participation Restrictions Community Activity;Yard Work    [employer] Evolving/Moderate complexity    Rehab Potential Good    PT Frequency 1x / week    PT Duration 8 weeks    PT Treatment/Interventions ADLs/Self Care Home Management;DME Instruction;Gait training;Stair training;Functional mobility training;Therapeutic activities;Therapeutic exercise;Balance training;Neuromuscular re-education;Patient/family education;Prosthetic Training;Vestibular;Passive range of motion    PT Next Visit Plan discharge PT    Consulted and Agree with Plan of Care Patient             Patient will benefit from skilled therapeutic intervention in order to improve the following deficits and impairments:  Abnormal gait, Decreased activity tolerance, Decreased balance, Decreased endurance, Decreased knowledge of use of DME, Decreased mobility, Decreased range of motion, Decreased skin integrity, Decreased scar mobility, Decreased strength, Increased edema, Impaired flexibility, Postural dysfunction, Prosthetic Dependency  Visit Diagnosis: Stiffness of right knee, not elsewhere classified  Other abnormalities of gait and mobility  Muscle weakness  (generalized)  Unsteadiness on feet  Abnormal posture     Problem List Patient Active Problem List   Diagnosis Date Noted   Exudative age-related macular degeneration of left eye with active choroidal neovascularization ([place]) 07/28/2021   Exudative age-related macular degeneration of right eye with active choroidal neovascularization ([place]) 06/01/2021   Intermediate stage nonexudative age-related macular degeneration of both eyes 06/01/2021   Type 2 diabetes mellitus with diabetic polyneuropathy, with long-term current use of insulin (HCC) 03/24/2021   Hypokalemia    Constipation 02/02/2021   Fecal impaction ([place]) 02/01/2021   Wound dehiscence 01/09/2021   Dehiscence of amputation stump ([place])    Status post percutaneous transluminal coronary angioplasty 01/06/2021   Type II diabetes mellitus, uncontrolled 01/06/2021   Acute pancreatitis 01/06/2021   Diabetic peripheral vascular disease ([place]) 01/06/2021   Encounter for screening for other disorder 01/06/2021   Enlarged prostate 01/06/2021   Foot ulcer, right ([place]) 01/06/2021   Gout 01/06/2021   Headache 01/06/2021   Neck pain 01/06/2021   Hypoglycemia 01/06/2021   Loss of appetite 01/06/2021   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   Peripheral neuropathy 01/06/2021   Sciatica 01/06/2021   Vitamin B12 deficiency 01/06/2021   Vitamin D deficiency 01/06/2021   Weakness 01/06/2021   Diabetes mellitus type 2 with neurological manifestations ([place]) 01/06/2021   Protein-calorie malnutrition, severe 12/26/2020   Acute blood loss anemia 12/26/2020   Prerenal azotemia 12/26/2020   Below-knee amputation of right lower extremity ([place]) 12/12/2020   Gangrene of right foot ([place])    Diabetic neuropathy ([place]) 11/17/2020   Hyperglycemia due to type 2 diabetes mellitus ([place]) 11/17/2020   Long term (current) use of insulin ([place]) 11/17/2020   Obesity 11/17/2020   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/2019   Hip dislocation, right ([place])  04/13/2019   Other intervertebral disc degeneration, lumbar region 03/30/2019   CAD (coronary artery disease) 01/30/2019   Tobacco abuse 01/30/2019   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   Burn, foot, second degree, left, initial encounter 06/08/2017   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 03/16/2017   S/P total knee arthroplasty, left 10/26/2016   Hyperlipidemia 09/04/2014   Thrombocytopenia (HCC)    Precordial chest pain 04/05/2014   Coronary atherosclerosis of native coronary artery 10/01/2013   Other and unspecified hyperlipidemia 10/01/2013   Primary hypertension 10/01/2013   Diabetes mellitus ([place]) 10/01/2013  Esophageal reflux 10/01/2013   Hypertrophy of prostate without urinary obstruction and other lower urinary tract symptoms (LUTS) 10/01/2013    Jamey Reas, PT, DPT 09/16/2021, 4:42 PM  [facility] [address] [place], [place], [address] Phone: [phone]   Fax:  [phone]  Name: Keith Espinoza MRN: [id number] Date of Birth: 1941-02-25

## 2021-09-17 ENCOUNTER — Ambulatory Visit (INDEPENDENT_AMBULATORY_CARE_PROVIDER_SITE_OTHER): Payer: Medicare Other | Admitting: Ophthalmology

## 2021-09-17 ENCOUNTER — Encounter (INDEPENDENT_AMBULATORY_CARE_PROVIDER_SITE_OTHER): Payer: Self-pay | Admitting: Ophthalmology

## 2021-09-17 DIAGNOSIS — H353221 Exudative age-related macular degeneration, left eye, with active choroidal neovascularization: Secondary | ICD-10-CM

## 2021-09-17 DIAGNOSIS — H353211 Exudative age-related macular degeneration, right eye, with active choroidal neovascularization: Secondary | ICD-10-CM | POA: Diagnosis not present

## 2021-09-17 MED ORDER — BEVACIZUMAB 2.5 MG/0.1ML IZ SOSY
2.5000 mg | PREFILLED_SYRINGE | INTRAVITREAL | Status: AC | PRN
  Administered 2021-09-17: 2.5 mg via INTRAVITREAL

## 2021-09-17 NOTE — Assessment & Plan Note (Signed)
OD current follow-up at 6 weeks postinjection for intraretinal fluid wet AMD.  Overall improved with stable acuity.  We will repeat injection today follow-up again in 6 to 7 weeks

## 2021-09-17 NOTE — Assessment & Plan Note (Signed)
OS, at 1 week post most recent injection follow-up as scheduled

## 2021-09-17 NOTE — Progress Notes (Signed)
09/17/2021     CHIEF COMPLAINT Patient presents for  Chief Complaint  Patient presents with   Retina Follow Up      HISTORY OF PRESENT ILLNESS: Keith Espinoza is a 80 y.o. male who presents to the clinic today for:   HPI     Retina Follow Up           Diagnosis: Wet AMD   Laterality: right eye   Onset: 6 weeks ago   Severity: mild   Duration: 6 weeks   Course: stable         Comments   6 week fu OD and OCT and Avastin OD-    Last injection: OS 09/08/2021 and  OD on 08/06/2021 Pt states VA OU stable since last visit. Pt denies FOL, floaters, or ocular pain OU.  LBS: 117        Last edited by Kendra Opitz, COA on 09/17/2021  1:10 PM.      Referring physician: [name], MD [address] [place],  [place] [address]  HISTORICAL INFORMATION:   Selected notes from the MEDICAL RECORD NUMBER    Lab Results  Component Value Date   HGBA1C 7.5 (A) 06/30/2021     CURRENT MEDICATIONS: No current outpatient medications on file. (Ophthalmic Drugs)   No current facility-administered medications for this visit. (Ophthalmic Drugs)   Current Outpatient Medications (Other)  Medication Sig   acetaminophen (TYLENOL) 500 MG tablet Take 1,000 mg by mouth every 6 (six) hours as needed (pain).   allopurinol (ZYLOPRIM) 300 MG tablet Take 300 mg by mouth daily.   amLODipine (NORVASC) 5 MG tablet Take 1 tablet (5 mg total) by mouth daily.   aspirin EC 81 MG tablet Take 81 mg by mouth daily.   Cholecalciferol (VITAMIN D3) 50 MCG (2000 UT) TABS Take 2,000 Units by mouth daily.    clopidogrel (PLAVIX) 75 MG tablet Take 1 tablet (75 mg total) by mouth daily.   Continuous Blood Gluc Receiver (DEXCOM G6 RECEIVER) DEVI 1 Device by Does not apply route as directed.   Continuous Blood Gluc Sensor (DEXCOM G6 SENSOR) MISC 1 Device by Does not apply route as directed.   Continuous Blood Gluc Transmit (DEXCOM G6 TRANSMITTER) MISC 1 Device by Does not apply route  as directed.   Cyanocobalamin (B-12) 2500 MCG TABS Take 2,500 mcg by mouth daily.   empagliflozin (JARDIANCE) 25 MG TABS tablet Take 1 tablet (25 mg total) by mouth daily.   gabapentin (NEURONTIN) 100 MG capsule Take 2 capsules (200 mg total) by mouth at bedtime. (Patient taking differently: Take 600 mg by mouth at bedtime.)   gabapentin (NEURONTIN) 400 MG capsule Take 1 capsule (400 mg total) by mouth 3 (three) times daily. (Patient taking differently: Take 400 mg by mouth daily.)   hydrochlorothiazide (HYDRODIURIL) 12.5 MG tablet Take 1 tablet (12.5 mg total) by mouth daily.   insulin degludec (TRESIBA FLEXTOUCH) 100 UNIT/ML FlexTouch Pen Inject 9 Units into the skin daily.   Insulin Pen Needle 32G X 4 MM MISC 1 Device by Does not apply route daily in the afternoon.   isosorbide mononitrate (IMDUR) 30 MG 24 hr tablet Take 1 tablet (30 mg total) by mouth daily. Please schedule yearly appointment for future refills. Thank you   linaclotide (LINZESS) 145 MCG CAPS capsule Take 1 capsule (145 mcg total) by mouth daily before breakfast.   losartan (COZAAR) 50 MG tablet Take 1 tablet (50 mg total) by mouth daily.  metFORMIN (GLUCOPHAGE) 1000 MG tablet Take 1 tablet (1,000 mg total) by mouth 2 (two) times daily.   methocarbamol (ROBAXIN) 500 MG tablet Take 1 tablet (500 mg total) by mouth every 6 (six) hours as needed for muscle spasms.   nitroGLYCERIN (NITROSTAT) 0.4 MG SL tablet Place 0.4 mg under the tongue every 5 (five) minutes x 3 doses as needed for chest pain.    ONE TOUCH ULTRA TEST test strip CHECK BLOOD SUGAR ONCE DAILY AS DIRECTED   oxyCODONE (OXY IR/ROXICODONE) 5 MG immediate release tablet Take 1 tablet (5 mg total) by mouth every 4 (four) hours as needed for severe pain.   polyethylene glycol (MIRALAX) 17 g packet Take 17 g by mouth 2 (two) times daily.   potassium chloride SA (KLOR-CON) 20 MEQ tablet Take 1 tablet (20 mEq total) by mouth daily.   senna-docusate (SENOKOT-S) 8.6-50 MG  tablet Take 1 tablet by mouth at bedtime.   terazosin (HYTRIN) 5 MG capsule Take 1 capsule (5 mg total) by mouth at bedtime.   traMADol (ULTRAM) 50 MG tablet Take 1 tablet (50 mg total) by mouth every 6 (six) hours as needed for moderate pain.   traZODone (DESYREL) 50 MG tablet Take 1 tablet (50 mg total) by mouth at bedtime as needed for sleep. (Patient taking differently: Take 50 mg by mouth 2 (two) times daily.)   No current facility-administered medications for this visit. (Other)      REVIEW OF SYSTEMS:    ALLERGIES Allergies  Allergen Reactions   Simvastatin Other (See Comments)    SEVERE MYALGIAS    Zetia [Ezetimibe] Other (See Comments)    MYALGIAS   Dilaudid [Hydromorphone Hcl] Other (See Comments)    hallucination    PAST MEDICAL HISTORY Past Medical History:  Diagnosis Date   Allergic rhinitis    Allergic rhinitis    Arthritis    Basal cell carcinoma 11/01/2019    bcc left chest treatment TX cx3 30fu    Chronic leg pain    right   Chronic lower back pain    Coronary artery disease    a. Stenting to RCA 2004; staged DES to LAD and Cx 2004. DES to mRCA 2012. b. DES to mCx, PTCA to dCx 11/2011. c. Lateral wall MI 2013 s/p PTCA to distal Cx & DES to mid OM2 11/2011. d. Low risk nuc 04/2014, EF wnl.   COVID-19    Diabetes mellitus    Insulin dependent   Diabetic neuropathy ([place])    MILD   Diverticulosis    Dysrhythmia    [name] syndrome    Gout    right wrist; right foot; right elbow; have had it since 1970's   H/O hiatal hernia    Heart murmur    History of echocardiogram    aortic sclerosis per echo 12/09 EF 65%, otherwise normal   History of hemorrhoids    BLEEDING   History of kidney stones    h/o   Hypertension    Diagnosed 1995    Myocardial infarction [facility])    Pancreatic pseudocyst    a. s/p remote drainage 2006.   Thrombocytopenia ([place])    Seen on oldest labs in system from 2004   Vitamin B 12 deficiency    orally replaced   Past Surgical  History:  Procedure Laterality Date   ABDOMINAL AORTOGRAM W/LOWER EXTREMITY Bilateral 08/08/2020   Procedure: ABDOMINAL AORTOGRAM W/LOWER EXTREMITY;  Surgeon: Angelia Mould, MD;  Location: [facility];  Service:  Cardiovascular;  Laterality: Bilateral;   AMPUTATION Right 12/12/2020   Procedure: RIGHT BELOW KNEE AMPUTATION;  Surgeon: [name], MD;  Location: [facility];  Service: Orthopedics;  Laterality: Right;   BACK SURGERY     "total of 3 times" S/P fall    CARPAL TUNNEL RELEASE Bilateral    CHOLECYSTECTOMY  1990's   COLONOSCOPY     CORONARY ANGIOPLASTY  11/11/11   CORONARY ANGIOPLASTY WITH STENT PLACEMENT  09/30/2011   "1 then; makes a total of 4"   CORONARY ANGIOPLASTY WITH STENT PLACEMENT  11/11/11   "1; makes a total of 5"   INGUINAL HERNIA REPAIR  2003   right   JOINT REPLACEMENT Right 04/03/2002   hip replacment   KNEE ARTHROSCOPY  1990's   left   LEFT HEART CATHETERIZATION WITH CORONARY ANGIOGRAM N/A 09/30/2011   Procedure: LEFT HEART CATHETERIZATION WITH CORONARY ANGIOGRAM;  Surgeon: Jettie Booze, MD;  Location[facility] CATH LAB;  Service: Cardiovascular;  Laterality: N/A;  possible PCI   LEFT HEART CATHETERIZATION WITH CORONARY ANGIOGRAM N/A 11/15/2011   Procedure: LEFT HEART CATHETERIZATION WITH CORONARY ANGIOGRAM;  Surgeon: Jettie Booze, MD;  Location: [facility] CATH LAB;  Service: Cardiovascular;  Laterality: N/A;   PERCUTANEOUS CORONARY STENT INTERVENTION (PCI-S)  09/30/2011   Procedure: PERCUTANEOUS CORONARY STENT INTERVENTION (PCI-S);  Surgeon: Jettie Booze, MD;  Location: [facility] CATH LAB;  Service: Cardiovascular;;   PERCUTANEOUS CORONARY STENT INTERVENTION (PCI-S) N/A 11/11/2011   Procedure: PERCUTANEOUS CORONARY STENT INTERVENTION (PCI-S);  Surgeon: Jettie Booze, MD;  Location: [facility] CATH LAB;  Service: Cardiovascular;  Laterality: N/A;   PERIPHERAL VASCULAR BALLOON ANGIOPLASTY Right 08/08/2020   Procedure: PERIPHERAL VASCULAR BALLOON ANGIOPLASTY;   Surgeon: Angelia Mould, MD;  Location: [facility];  Service: Cardiovascular;  Laterality: Right;  Posterior tibial    SHOULDER SURGERY Right    X 2   STUMP REVISION Right 01/09/2021   Procedure: REVISION RIGHT BELOW KNEE AMPUTATION;  Surgeon: [name], MD;  Location: [facility];  Service: Orthopedics;  Laterality: Right;   TONSILLECTOMY  ~ White Right 04/13/2019   Procedure: RIGHT TOTAL HIP REVISION-POSTERIOR  APPROACH LATERAL;  Surgeon: Marybelle Killings, MD;  Location: [facility];  Service: Orthopedics;  Laterality: Right;   TOTAL KNEE ARTHROPLASTY Left 07/23/2016   Procedure: LEFT TOTAL KNEE ARTHROPLASTY;  Surgeon: Marybelle Killings, MD;  Location: [place];  Service: Orthopedics;  Laterality: Left;    FAMILY HISTORY Family History  Problem Relation Age of Onset   Diabetes Mother    Hyperlipidemia Mother    Hypertension Mother    Cancer Father    Hypertension Father    Diabetes Sister    Hypertension Sister    Cancer Brother    Heart attack Neg Hx     SOCIAL HISTORY Social History   Tobacco Use   Smoking status: Former   Smokeless tobacco: Current    Types: Chew   Tobacco comments:    quit 60 years ago  Vaping Use   Vaping Use: Never used  Substance Use Topics   Alcohol use: No   Drug use: No         OPHTHALMIC EXAM:  Base Eye Exam     Visual Acuity (ETDRS)       Right Left   Dist cc 20/20 -1 20/30 -1   Dist ph cc  20/25         Tonometry (Tonopen, 1:14 PM)  Right Left   Pressure 11 10         Pupils       Pupils APD   Right PERRL None   Left PERRL None         Visual Fields (Counting fingers)       Left Right    Full Full         Extraocular Movement       Right Left    Full, Ortho Full, Ortho         Neuro/Psych     Oriented x3: Yes   Mood/Affect: Normal         Dilation     Right eye: 1.0% Mydriacyl, 2.5% Phenylephrine @ 1:14 PM           Slit Lamp and Fundus Exam     External  Exam       Right Left   External Normal Normal         Slit Lamp Exam       Right Left   Lids/Lashes Normal Normal   Conjunctiva/Sclera White and quiet White and quiet   Cornea Clear Clear   Anterior Chamber Deep and quiet Deep and quiet   Iris Round and reactive Round and reactive   Lens Centered posterior chamber intraocular lens Centered posterior chamber intraocular lens   Anterior Vitreous Normal Normal         Fundus Exam       Right Left   Posterior Vitreous Normal    Disc Normal    C/D Ratio 0.25    Macula Retinal pigment epithelial mottling, Intermediate age related macular degeneration    Vessels Normal, no DR    Periphery Normal             IMAGING AND PROCEDURES  Imaging and Procedures for 09/17/21  OCT, Retina - OU - Both Eyes       Right Eye Quality was borderline. Scan locations included subfoveal. Central Foveal Thickness: 261. Progression has worsened. Findings include abnormal foveal contour, subretinal scarring, inner retinal atrophy, intraretinal hyper-reflective material, intraretinal fluid.   Left Eye Quality was borderline. Scan locations included subfoveal. Central Foveal Thickness: 244. Progression has been stable. Findings include abnormal foveal contour, retinal drusen , intraretinal hyper-reflective material, intraretinal fluid.   Notes OD much vastly improved OD post injection Avastin.  Currently at 6 weeks.  Repeat injection OD  follow-up as scheduled.  OS, macular findings also vastly improved currently at 1-week follow-up.      Intravitreal Injection, Pharmacologic Agent - OD - Right Eye       Time Out 09/17/2021. 1:28 PM. Confirmed correct patient, procedure, site, and patient consented.   Anesthesia Topical anesthesia was used. Anesthetic medications included Lidocaine 4%.   Procedure Preparation included Tobramycin 0.3%, 10% betadine to eyelids, 5% betadine to ocular surface. A 30 gauge needle was used.    Injection: 2.5 mg bevacizumab 2.5 MG/0.1ML   Route: Intravitreal, Site: Right Eye   NDC: [phone], Lot: [id number]   Post-op Post injection exam found visual acuity of at least counting fingers. The patient tolerated the procedure well. There were no complications. The patient received written and verbal post procedure care education. Post injection medications included ocuflox.              ASSESSMENT/PLAN:  Exudative age-related macular degeneration of right eye with active choroidal neovascularization (HCC) OD current follow-up at 6 weeks postinjection for intraretinal fluid wet AMD.  Overall improved with  stable acuity.  We will repeat injection today follow-up again in 6 to 7 weeks  Exudative age-related macular degeneration of left eye with active choroidal neovascularization ([place]) OS, at 1 week post most recent injection follow-up as scheduled     ICD-10-CM   1. Exudative age-related macular degeneration of right eye with active choroidal neovascularization (HCC)  H35.3211 OCT, Retina - OU - Both Eyes    Intravitreal Injection, Pharmacologic Agent - OD - Right Eye    bevacizumab (AVASTIN) SOSY 2.5 mg    2. Exudative age-related macular degeneration of left eye with active choroidal neovascularization ([place])  H35.3221       1.  OD, overall much improved with stable acuity.  Less active disease today at 6 weeks postinjection Avastin.  Repeat injection today  2.  Dilate OS as scheduled roughly 6 weeks from now as well as dilate OD  3.  Ophthalmic Meds Ordered this visit:  Meds ordered this encounter  Medications   bevacizumab (AVASTIN) SOSY 2.5 mg       Return in about 6 weeks (around 10/29/2021) for RV 6 to 7 weeks, dilate, OD, AVASTIN OCT,, and follow-up OS as scheduled.  There are no Patient Instructions on file for this visit.   Explained the diagnoses, plan, and follow up with the patient and they expressed understanding.  Patient expressed understanding  of the importance of proper follow up care.   Clent Demark Willet Schleifer M.D. Diseases & Surgery of the Retina and Vitreous [facility]ina [facility] Diabetic [place] 09/17/21     Abbreviations: M myopia (nearsighted); A astigmatism; H hyperopia (farsighted); P presbyopia; Mrx spectacle prescription;  CTL contact lenses; OD right eye; OS left eye; OU both eyes  XT exotropia; ET esotropia; PEK punctate epithelial keratitis; PEE punctate epithelial erosions; DES dry eye syndrome; MGD meibomian gland dysfunction; ATs artificial tears; PFAT's preservative free artificial tears; [place] nuclear sclerotic cataract; PSC posterior subcapsular cataract; ERM epi-retinal membrane; PVD posterior vitreous detachment; RD retinal detachment; DM diabetes mellitus; DR diabetic retinopathy; NPDR non-proliferative diabetic retinopathy; PDR proliferative diabetic retinopathy; CSME clinically significant macular edema; DME diabetic macular edema; dbh dot blot hemorrhages; CWS cotton wool spot; POAG primary open angle glaucoma; C/D cup-to-disc ratio; HVF humphrey visual field; GVF goldmann visual field; OCT optical coherence tomography; IOP intraocular pressure; BRVO Branch retinal vein occlusion; CRVO central retinal vein occlusion; CRAO central retinal artery occlusion; BRAO branch retinal artery occlusion; RT retinal tear; SB scleral buckle; PPV pars plana vitrectomy; VH Vitreous hemorrhage; PRP panretinal laser photocoagulation; IVK intravitreal kenalog; VMT vitreomacular traction; MH Macular hole;  NVD neovascularization of the disc; NVE neovascularization elsewhere; AREDS age related eye disease study; ARMD age related macular degeneration; POAG primary open angle glaucoma; EBMD epithelial/anterior basement membrane dystrophy; ACIOL anterior chamber intraocular lens; IOL intraocular lens; PCIOL posterior chamber intraocular lens; Phaco/IOL phacoemulsification with intraocular lens placement; [place] photorefractive keratectomy; LASIK laser assisted in  situ keratomileusis; HTN hypertension; DM diabetes mellitus; COPD chronic obstructive pulmonary disease

## 2021-09-21 ENCOUNTER — Encounter: Payer: Medicare Other | Admitting: Physical Therapy

## 2021-09-23 ENCOUNTER — Encounter: Payer: Medicare Other | Admitting: Physical Therapy

## 2021-09-24 ENCOUNTER — Encounter: Payer: Self-pay | Admitting: Orthopedic Surgery

## 2021-09-24 ENCOUNTER — Ambulatory Visit: Payer: Medicare Other | Admitting: Orthopedic Surgery

## 2021-09-24 ENCOUNTER — Ambulatory Visit (INDEPENDENT_AMBULATORY_CARE_PROVIDER_SITE_OTHER): Payer: Medicare Other | Admitting: Orthopedic Surgery

## 2021-09-24 ENCOUNTER — Other Ambulatory Visit: Payer: Self-pay

## 2021-09-24 DIAGNOSIS — Z89511 Acquired absence of right leg below knee: Secondary | ICD-10-CM | POA: Diagnosis not present

## 2021-09-24 DIAGNOSIS — I872 Venous insufficiency (chronic) (peripheral): Secondary | ICD-10-CM | POA: Diagnosis not present

## 2021-09-24 DIAGNOSIS — L97909 Non-pressure chronic ulcer of unspecified part of unspecified lower leg with unspecified severity: Secondary | ICD-10-CM

## 2021-09-24 DIAGNOSIS — I70299 Other atherosclerosis of native arteries of extremities, unspecified extremity: Secondary | ICD-10-CM

## 2021-09-24 NOTE — Progress Notes (Signed)
Office Visit Note   Patient: Keith Espinoza           Date of Birth: 1941/05/20           MRN: [id number] Visit Date: 09/24/2021              Requested by: [name], MD [address] [place],  [place] [address] PCP: [name], MD  Chief Complaint  Patient presents with   Left Foot - Injury      HPI: Patient is an 80 year old gentleman who states he has a new red spot on the dorsum of the left foot.  Patient also states that his foot is cold.  Patient does have a follow-up appoint with vascular vein surgery in January.  Patient states that his socket on the right leg is loose and he has instability with walking.  Assessment & Plan: Visit Diagnoses:  1. Right below-knee amputee ([place])   2. Venous insufficiency (chronic) (peripheral)     Plan: Patient is given a prescription for new socket liner materials and supplies with [employer] for the right BKA.  Patient was given a crew sock to wear on the left to help with gentle compression to help with the venous insufficiency.  Anticipate patient may need arterial studies for the left lower extremity there are no ischemic ulcers.  Follow-Up Instructions: Return if symptoms worsen or fail to improve.   Ortho Exam  Patient is alert, oriented, no adenopathy, well-dressed, normal affect, normal respiratory effort. Examination the right transtibial amputation is loose the leg does not have support and patient has been unstable with ambulation.  The left foot he does have some prominent varicose veins with massage and elevation the redness resolved with dependence the redness returns consistent with venous insufficiency.  There are no venous ulcers there are no ischemic ulcers.  Patient is an existing right transtibial  amputee.  Patient's current comorbidities are not expected to impact the ability to function with the prescribed prosthesis. Patient verbally communicates a strong desire to use a prosthesis. Patient  currently requires mobility aids to ambulate without a prosthesis.  Expects not to use mobility aids with a new prosthesis.  Patient is a K2 level ambulator that will use a prosthesis to walk around their home and the community over low level environmental barriers.      Imaging: No results found. No images are attached to the encounter.  Labs: Lab Results  Component Value Date   HGBA1C 7.5 (A) 06/30/2021   HGBA1C 7.5 (H) 02/01/2021   HGBA1C 7.7 (H) 12/12/2020   REPTSTATUS 04/18/2019 FINAL 04/13/2019   GRAMSTAIN  04/13/2019    RARE WBC PRESENT, PREDOMINANTLY PMN NO ORGANISMS SEEN    CULT  04/13/2019    No growth aerobically or anaerobically. Performed at [facility], [place] [address] [place],  [address]      Lab Results  Component Value Date   ALBUMIN 3.5 01/31/2021   ALBUMIN 2.9 (L) 12/17/2020   ALBUMIN 4.4 04/10/2019    Lab Results  Component Value Date   MG 2.7 (H) 02/01/2021   No results found for: VD25OH  No results found for: PREALBUMIN CBC EXTENDED Latest Ref Rng & Units 02/03/2021 02/01/2021 01/31/2021  WBC 4.0 - 10.5 K/uL 6.2 9.3 11.6(H)  RBC 4.22 - 5.81 MIL/uL 3.36(L) 3.59(L) 3.88(L)  HGB 13.0 - 17.0 g/dL 9.9(L) 10.6(L) 11.2(L)  HCT 39.0 - 52.0 % 28.9(L) 32.1(L) 35.9(L)  PLT 150 - 400 K/uL 152  171 187  NEUTROABS 1.7 - 7.7 K/uL - - -  LYMPHSABS 0.7 - 4.0 K/uL - - -     There is no height or weight on file to calculate BMI.  Orders:  No orders of the defined types were placed in this encounter.  No orders of the defined types were placed in this encounter.    Procedures: No procedures performed  Clinical Data: No additional findings.  ROS:  All other systems negative, except as noted in the HPI. Review of Systems  Objective: Vital Signs: There were no vitals taken for this visit.  Specialty Comments:  No specialty comments available.  PMFS History: Patient Active Problem List   Diagnosis Date Noted   Exudative  age-related macular degeneration of left eye with active choroidal neovascularization ([place]) 07/28/2021   Exudative age-related macular degeneration of right eye with active choroidal neovascularization ([place]) 06/01/2021   Intermediate stage nonexudative age-related macular degeneration of both eyes 06/01/2021   Type 2 diabetes mellitus with diabetic polyneuropathy, with long-term current use of insulin ([place]) 03/24/2021   Hypokalemia    Constipation 02/02/2021   Fecal impaction ([place]) 02/01/2021   Wound dehiscence 01/09/2021   Dehiscence of amputation stump ([place])    Status post percutaneous transluminal coronary angioplasty 01/06/2021   Type II diabetes mellitus, uncontrolled 01/06/2021   Acute pancreatitis 01/06/2021   Diabetic peripheral vascular disease ([place]) 01/06/2021   Encounter for screening for other disorder 01/06/2021   Enlarged prostate 01/06/2021   Foot ulcer, right ([place]) 01/06/2021   Gout 01/06/2021   Headache 01/06/2021   Neck pain 01/06/2021   Hypoglycemia 01/06/2021   Loss of appetite 01/06/2021   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   Peripheral neuropathy 01/06/2021   Sciatica 01/06/2021   Vitamin B12 deficiency 01/06/2021   Vitamin D deficiency 01/06/2021   Weakness 01/06/2021   Diabetes mellitus type 2 with neurological manifestations ([place]) 01/06/2021   Protein-calorie malnutrition, severe 12/26/2020   Acute blood loss anemia 12/26/2020   Prerenal azotemia 12/26/2020   Below-knee amputation of right lower extremity ([place]) 12/12/2020   Gangrene of right foot ([place])    Diabetic neuropathy ([place]) 11/17/2020   Hyperglycemia due to type 2 diabetes mellitus ([place]) 11/17/2020   Long term (current) use of insulin ([place]) 11/17/2020   Obesity 11/17/2020   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/2019   Hip dislocation, right ([place]) 04/13/2019   Other intervertebral disc degeneration, lumbar region 03/30/2019   CAD (coronary artery disease) 01/30/2019   Tobacco abuse 01/30/2019    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   Burn, foot, second degree, left, initial encounter 06/08/2017   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 03/16/2017   S/P total knee arthroplasty, left 10/26/2016   Hyperlipidemia 09/04/2014   Thrombocytopenia ([facility])    Precordial chest pain 04/05/2014   Coronary atherosclerosis of native coronary artery 10/01/2013   Other and unspecified hyperlipidemia 10/01/2013   Primary hypertension 10/01/2013   Diabetes mellitus ([place]) 10/01/2013   Esophageal reflux 10/01/2013   Hypertrophy of prostate without urinary obstruction and other lower urinary tract symptoms (LUTS) 10/01/2013   Past Medical History:  Diagnosis Date   Allergic rhinitis    Allergic rhinitis    Arthritis    Basal cell carcinoma 11/01/2019    bcc left chest treatment TX [id number]    Chronic leg pain    right   Chronic lower back pain    Coronary artery disease    a. Stenting to RCA 2004; staged DES to LAD and  Cx 2004. DES to mRCA 2012. b. DES to mCx, PTCA to dCx 11/2011. c. Lateral wall MI 2013 s/p PTCA to distal Cx & DES to mid OM2 11/2011. d. Low risk nuc 04/2014, EF wnl.   COVID-19    Diabetes mellitus    Insulin dependent   Diabetic neuropathy ([place])    MILD   Diverticulosis    Dysrhythmia    [name] syndrome    Gout    right wrist; right foot; right elbow; have had it since 1970's   H/O hiatal hernia    Heart murmur    History of echocardiogram    aortic sclerosis per echo 12/09 EF 65%, otherwise normal   History of hemorrhoids    BLEEDING   History of kidney stones    h/o   Hypertension    Diagnosed 1995    Myocardial infarction [facility])    Pancreatic pseudocyst    a. s/p remote drainage 2006.   Thrombocytopenia ([place])    Seen on oldest labs in system from 2004   Vitamin B 12 deficiency    orally replaced    Family History  Problem Relation Age of Onset   Diabetes Mother    Hyperlipidemia Mother    Hypertension Mother    Cancer Father     Hypertension Father    Diabetes Sister    Hypertension Sister    Cancer Brother    Heart attack Neg Hx     Past Surgical History:  Procedure Laterality Date   ABDOMINAL AORTOGRAM W/LOWER EXTREMITY Bilateral 08/08/2020   Procedure: ABDOMINAL AORTOGRAM W/LOWER EXTREMITY;  Surgeon: Angelia Mould, MD;  Location: [place] [facility];  Service: Cardiovascular;  Laterality: Bilateral;   AMPUTATION Right 12/12/2020   Procedure: RIGHT BELOW KNEE AMPUTATION;  Surgeon: [name], MD;  Location: [place];  Service: Orthopedics;  Laterality: Right;   BACK SURGERY     "total of 3 times" S/P fall    CARPAL TUNNEL RELEASE Bilateral    CHOLECYSTECTOMY  1990's   COLONOSCOPY     CORONARY ANGIOPLASTY  11/11/11   CORONARY ANGIOPLASTY WITH STENT PLACEMENT  09/30/2011   "1 then; makes a total of 4"   CORONARY ANGIOPLASTY WITH STENT PLACEMENT  11/11/11   "1; makes a total of 5"   INGUINAL HERNIA REPAIR  2003   right   JOINT REPLACEMENT Right 04/03/2002   hip replacment   KNEE ARTHROSCOPY  1990's   left   LEFT HEART CATHETERIZATION WITH CORONARY ANGIOGRAM N/A 09/30/2011   Procedure: LEFT HEART CATHETERIZATION WITH CORONARY ANGIOGRAM;  Surgeon: Jettie Booze, MD;  Location: [facility] C[facility] LAB;  Service: Cardiovascular;  Laterality: N/A;  possible PCI   LEFT HEART CATHETERIZATION WITH CORONARY ANGIOGRAM N/A 11/15/2011   Procedure: LEFT HEART CATHETERIZATION WITH CORONARY ANGIOGRAM;  Surgeon: Jettie Booze, MD;  Location: [facility] CATH LAB;  Service: Cardiovascular;  Laterality: N/A;   PERCUTANEOUS CORONARY STENT INTERVENTION (PCI-S)  09/30/2011   Procedure: PERCUTANEOUS CORONARY STENT INTERVENTION (PCI-S);  Surgeon: Jettie Booze, MD;  Location: [name] LAB;  Service: Cardiovascular;;   PERCUTANEOUS CORONARY STENT INTERVENTION (PCI-S) N/A 11/11/2011   Procedure: PERCUTANEOUS CORONARY STENT INTERVENTION (PCI-S);  Surgeon: Jettie Booze, MD;  Location: [facility] CATH LAB;  Service: Cardiovascular;   Laterality: N/A;   PERIPHERAL VASCULAR BALLOON ANGIOPLASTY Right 08/08/2020   Procedure: PERIPHERAL VASCULAR BALLOON ANGIOPLASTY;  Surgeon: Angelia Mould, MD;  Location: [place] [facility];  Service: Cardiovascular;  Laterality: Right;  Posterior tibial  SHOULDER SURGERY Right    X 2   STUMP REVISION Right 01/09/2021   Procedure: REVISION RIGHT BELOW KNEE AMPUTATION;  Surgeon: [name], MD;  Location: [place];  Service: Orthopedics;  Laterality: Right;   TONSILLECTOMY  ~ [place] Right 04/13/2019   Procedure: RIGHT TOTAL HIP REVISION-POSTERIOR  APPROACH LATERAL;  Surgeon: Marybelle Killings, MD;  Location: [place];  Service: Orthopedics;  Laterality: Right;   TOTAL KNEE ARTHROPLASTY Left 07/23/2016   Procedure: LEFT TOTAL KNEE ARTHROPLASTY;  Surgeon: Marybelle Killings, MD;  Location: [place];  Service: Orthopedics;  Laterality: Left;   Social History   Occupational History   Occupation: Retired  Tobacco Use   Smoking status: Former   Smokeless tobacco: Current    Types: Chew   Tobacco comments:    quit 60 years ago  Vaping Use   Vaping Use: Never used  Substance and Sexual Activity   Alcohol use: No   Drug use: No   Sexual activity: Not Currently

## 2021-10-06 ENCOUNTER — Encounter: Payer: Self-pay | Admitting: Internal Medicine

## 2021-10-06 ENCOUNTER — Ambulatory Visit (INDEPENDENT_AMBULATORY_CARE_PROVIDER_SITE_OTHER): Payer: Medicare Other | Admitting: Internal Medicine

## 2021-10-06 VITALS — BP 122/70 | HR 87 | Ht 70.0 in | Wt 150.2 lb

## 2021-10-06 DIAGNOSIS — D3502 Benign neoplasm of left adrenal gland: Secondary | ICD-10-CM | POA: Diagnosis not present

## 2021-10-06 DIAGNOSIS — Z89511 Acquired absence of right leg below knee: Secondary | ICD-10-CM

## 2021-10-06 DIAGNOSIS — Z794 Long term (current) use of insulin: Secondary | ICD-10-CM

## 2021-10-06 DIAGNOSIS — E1142 Type 2 diabetes mellitus with diabetic polyneuropathy: Secondary | ICD-10-CM | POA: Diagnosis not present

## 2021-10-06 DIAGNOSIS — E1159 Type 2 diabetes mellitus with other circulatory complications: Secondary | ICD-10-CM

## 2021-10-06 LAB — POCT GLYCOSYLATED HEMOGLOBIN (HGB A1C): Hemoglobin A1C: 8.3 % — AB (ref 4.0–5.6)

## 2021-10-06 LAB — GLUCOSE, POCT (MANUAL RESULT ENTRY): POC Glucose: 256 mg/dl — AB (ref 70–99)

## 2021-10-06 MED ORDER — PIOGLITAZONE HCL 15 MG PO TABS: 15.0000 mg | ORAL_TABLET | Freq: Every day | ORAL | 1 refills | Status: DC

## 2021-10-06 NOTE — Patient Instructions (Signed)
-   Levemir orTresiba, take  10 units daily  - Continue Metformin 1000 mg Twice a day  - Continue Jardiance 25 mg daily - Start Actos 15 mg , 1 tablet daily     HOW TO TREAT LOW BLOOD SUGARS (Blood sugar LESS THAN 70 MG/DL) Please follow the RULE OF 15 for the treatment of hypoglycemia treatment (when your (blood sugars are less than 70 mg/dL)   STEP 1: Take 15 grams of carbohydrates when your blood sugar is low, which includes:  3-4 GLUCOSE TABS  OR 3-4 OZ OF JUICE OR REGULAR SODA OR ONE TUBE OF GLUCOSE GEL    STEP 2: RECHECK blood sugar in 15 MINUTES STEP 3: If your blood sugar is still low at the 15 minute recheck --> then, go back to STEP 1 and treat AGAIN with another 15 grams of carbohydrates.

## 2021-10-06 NOTE — Progress Notes (Signed)
Name: Keith Espinoza  Age/ Sex: 81 y.o., male   MRN/ DOB: [id number], October 06, 1940     PCP: [name], MD   Reason for Endocrinology Evaluation: Type 2 Diabetes Mellitus  Initial Endocrine Consultative Visit: 03/24/2021    PATIENT IDENTIFIER: Keith Espinoza is a 81 y.o. male with a past medical history of Right BK amputations due  PVD, T2DM and Pancreatitis .Marland Kitchen The patient has followed with Endocrinology clinic since 03/24/2021 for consultative assistance with management of his diabetes.  DIABETIC HISTORY:  Keith Espinoza was diagnosed with DM 9622, Trulicity caused weight loss, Has been on Glimepiride in the past. His hemoglobin A1c has ranged from 6.6% in 2020, peaking at 7.7% in 2022.     Transitioned care from Dr. Nehemiah Settle in 2022    On his initial visit to our clinic he had an A1c 7.5 % He was on jardiance, Metformin and Basal insulin, which I have reduced basal insulin    ADRENAL HISTORY : Pt found to have a 1 cm left adrenal nodule on CT imaging of abdomen on 01/31/2021. Of note, the pt was noted to have hypokalemia , this is chronic and has been on KCL for ~ 5 yrs      Aldo, renin, 24-hr urinary cortisol and catecholamine have all come back normal 03/2021   SUBJECTIVE:   During the last visit (06/30/2021): A1c 7.5% Switched Levemir to [place]  , continued metformin and Jardiance       Today (10/06/2021): Keith Espinoza is here for a follow up on diabetes management.  He checks his blood sugars 1 times daily. The patient has not had hypoglycemic episodes since the last clinic visit.   Denies nausea, vomiting or diarrhea  Schedule to see Dr. Sharol Given for a left foot skin lesion   HOME DIABETES REGIMEN:  Tyler Aas  9 units daily - still on Levemir  Metformin 1000 mg Twice a day  Jardiance 25 mg daily     Statin: Intolerant to Simvastatin due to severe myalgia  ACE-I/ARB: yes      METER DOWNLOAD SUMMARY: Did not bring     DIABETIC COMPLICATIONS: Microvascular  complications:  Neuropathy, ?DR Denies: CKD Last Eye Exam: Completed 03/2021  Macrovascular complications:  CAD, PVD Denies: CVA   HISTORY:  Past Medical History:  Past Medical History:  Diagnosis Date   Allergic rhinitis    Allergic rhinitis    Arthritis    Basal cell carcinoma 11/01/2019    bcc left chest treatment [place] cx3 69fu    Chronic leg pain    right   Chronic lower back pain    Coronary artery disease    a. Stenting to RCA 2004; staged DES to LAD and Cx 2004. DES to mRCA 2012. b. DES to mCx, PTCA to dCx 11/2011. c. Lateral wall MI 2013 s/p PTCA to distal Cx & DES to mid OM2 11/2011. d. Low risk nuc 04/2014, EF wnl.   COVID-19    Diabetes mellitus    Insulin dependent   Diabetic neuropathy (HCC)    MILD   Diverticulosis    Dysrhythmia    [name] syndrome    Gout    right wrist; right foot; right elbow; have had it since 1970's   H/O hiatal hernia    Heart murmur    History of echocardiogram    aortic sclerosis per echo 12/09 EF 65%, otherwise normal   History of hemorrhoids    BLEEDING   History of kidney stones  h/o   Hypertension    Diagnosed 1995    Myocardial infarction [facility])    Pancreatic pseudocyst    a. s/p remote drainage 2006.   Thrombocytopenia ([place])    Seen on oldest labs in system from 2004   Vitamin B 12 deficiency    orally replaced   Past Surgical History:  Past Surgical History:  Procedure Laterality Date   ABDOMINAL AORTOGRAM W/LOWER EXTREMITY Bilateral 08/08/2020   Procedure: ABDOMINAL AORTOGRAM W/LOWER EXTREMITY;  Surgeon: Angelia Mould, MD;  Location: [place] [facility];  Service: Cardiovascular;  Laterality: Bilateral;   AMPUTATION Right 12/12/2020   Procedure: RIGHT BELOW KNEE AMPUTATION;  Surgeon: [name], MD;  Location: [place];  Service: Orthopedics;  Laterality: Right;   BACK SURGERY     "total of 3 times" S/P fall    CARPAL TUNNEL RELEASE Bilateral    CHOLECYSTECTOMY  1990's   COLONOSCOPY     CORONARY  ANGIOPLASTY  11/11/11   CORONARY ANGIOPLASTY WITH STENT PLACEMENT  09/30/2011   "1 then; makes a total of 4"   CORONARY ANGIOPLASTY WITH STENT PLACEMENT  11/11/11   "1; makes a total of 5"   INGUINAL HERNIA REPAIR  2003   right   JOINT REPLACEMENT Right 04/03/2002   hip replacment   KNEE ARTHROSCOPY  1990's   left   LEFT HEART CATHETERIZATION WITH CORONARY ANGIOGRAM N/A 09/30/2011   Procedure: LEFT HEART CATHETERIZATION WITH CORONARY ANGIOGRAM;  Surgeon: Jettie Booze, MD;  Location: [facility] CATH LAB;  Service: Cardiovascular;  Laterality: N/A;  possible PCI   LEFT HEART CATHETERIZATION WITH CORONARY ANGIOGRAM N/A 11/15/2011   Procedure: LEFT HEART CATHETERIZATION WITH CORONARY ANGIOGRAM;  Surgeon: Jettie Booze, MD;  Location: [facility] CATH LAB;  Service: Cardiovascular;  Laterality: N/A;   PERCUTANEOUS CORONARY STENT INTERVENTION (PCI-S)  09/30/2011   Procedure: PERCUTANEOUS CORONARY STENT INTERVENTION (PCI-S);  Surgeon: Jettie Booze, MD;  Location: [facility] CATH LAB;  Service: Cardiovascular;;   PERCUTANEOUS CORONARY STENT INTERVENTION (PCI-S) N/A 11/11/2011   Procedure: PERCUTANEOUS CORONARY STENT INTERVENTION (PCI-S);  Surgeon: Jettie Booze, MD;  Location: [facility] CATH LAB;  Service: Cardiovascular;  Laterality: N/A;   PERIPHERAL VASCULAR BALLOON ANGIOPLASTY Right 08/08/2020   Procedure: PERIPHERAL VASCULAR BALLOON ANGIOPLASTY;  Surgeon: Angelia Mould, MD;  Location: [place] [facility];  Service: Cardiovascular;  Laterality: Right;  Posterior tibial    SHOULDER SURGERY Right    X 2   STUMP REVISION Right 01/09/2021   Procedure: REVISION RIGHT BELOW KNEE AMPUTATION;  Surgeon: [name], MD;  Location: [place];  Service: Orthopedics;  Laterality: Right;   TONSILLECTOMY  ~ [place] Right 04/13/2019   Procedure: RIGHT TOTAL HIP REVISION-POSTERIOR  APPROACH LATERAL;  Surgeon: Marybelle Killings, MD;  Location: [place];  Service: Orthopedics;  Laterality: Right;   TOTAL KNEE  ARTHROPLASTY Left 07/23/2016   Procedure: LEFT TOTAL KNEE ARTHROPLASTY;  Surgeon: Marybelle Killings, MD;  Location: [place];  Service: Orthopedics;  Laterality: Left;   Social History:  reports that he has quit smoking. His smokeless tobacco use includes chew. He reports that he does not drink alcohol and does not use drugs. Family History:  Family History  Problem Relation Age of Onset   Diabetes Mother    Hyperlipidemia Mother    Hypertension Mother    Cancer Father    Hypertension Father    Diabetes Sister    Hypertension Sister    Cancer Brother  Heart attack Neg Hx      HOME MEDICATIONS: Allergies as of 10/06/2021       Reactions   Simvastatin Other (See Comments)   SEVERE MYALGIAS   Zetia [ezetimibe] Other (See Comments)   MYALGIAS   Dilaudid [hydromorphone Hcl] Other (See Comments)   hallucination        Medication List        Accurate as of October 06, 2021  1:02 PM. If you have any questions, ask your nurse or doctor.          STOP taking these medications    Dexcom G6 Receiver Devi Stopped by: Dorita Sciara, MD   Dexcom G6 Sensor Misc Stopped by: Dorita Sciara, MD   Dexcom G6 Transmitter Misc Stopped by: Dorita Sciara, MD   linaclotide 145 MCG Caps capsule Commonly known as: LINZESS Stopped by: Dorita Sciara, MD   methocarbamol 500 MG tablet Commonly known as: ROBAXIN Stopped by: Dorita Sciara, MD   oxyCODONE 5 MG immediate release tablet Commonly known as: Oxy IR/ROXICODONE Stopped by: Dorita Sciara, MD       TAKE these medications    acetaminophen 500 MG tablet Commonly known as: TYLENOL Take 1,000 mg by mouth every 6 (six) hours as needed (pain).   allopurinol 300 MG tablet Commonly known as: ZYLOPRIM Take 300 mg by mouth daily.   amLODipine 5 MG tablet Commonly known as: NORVASC Take 1 tablet (5 mg total) by mouth daily.   aspirin EC 81 MG tablet Take 81 mg by mouth daily.   B-12 2500  MCG Tabs Take 2,500 mcg by mouth daily.   clopidogrel 75 MG tablet Commonly known as: PLAVIX Take 1 tablet (75 mg total) by mouth daily.   empagliflozin 25 MG Tabs tablet Commonly known as: JARDIANCE Take 1 tablet (25 mg total) by mouth daily.   gabapentin 400 MG capsule Commonly known as: NEURONTIN Take 1 capsule (400 mg total) by mouth 3 (three) times daily. What changed: when to take this   gabapentin 100 MG capsule Commonly known as: NEURONTIN Take 2 capsules (200 mg total) by mouth at bedtime. What changed: how much to take   hydrochlorothiazide 12.5 MG tablet Commonly known as: HYDRODIURIL Take 1 tablet (12.5 mg total) by mouth daily.   Insulin Pen Needle 32G X 4 MM Misc 1 Device by Does not apply route daily in the afternoon.   isosorbide mononitrate 30 MG 24 hr tablet Commonly known as: IMDUR Take 1 tablet (30 mg total) by mouth daily. Please schedule yearly appointment for future refills. Thank you   losartan 50 MG tablet Commonly known as: COZAAR Take 1 tablet (50 mg total) by mouth daily.   metFORMIN 1000 MG tablet Commonly known as: GLUCOPHAGE Take 1 tablet (1,000 mg total) by mouth 2 (two) times daily.   nitroGLYCERIN 0.4 MG SL tablet Commonly known as: NITROSTAT Place 0.4 mg under the tongue every 5 (five) minutes x 3 doses as needed for chest pain.   ONE TOUCH ULTRA TEST test strip Generic drug: glucose blood CHECK BLOOD SUGAR ONCE DAILY AS DIRECTED   pioglitazone 15 MG tablet Commonly known as: Actos Take 1 tablet (15 mg total) by mouth daily. Started by: Dorita Sciara, MD   polyethylene glycol 17 g packet Commonly known as: MiraLax Take 17 g by mouth 2 (two) times daily.   potassium chloride SA 20 MEQ tablet Commonly known as: KLOR-CON M Take 1 tablet (20 mEq total)  by mouth daily.   senna-docusate 8.6-50 MG tablet Commonly known as: Senokot-S Take 1 tablet by mouth at bedtime.   terazosin 5 MG capsule Commonly known as:  HYTRIN Take 1 capsule (5 mg total) by mouth at bedtime.   traMADol 50 MG tablet Commonly known as: ULTRAM Take 1 tablet (50 mg total) by mouth every 6 (six) hours as needed for moderate pain.   traZODone 50 MG tablet Commonly known as: DESYREL Take 1 tablet (50 mg total) by mouth at bedtime as needed for sleep. What changed: when to take this   [place] FlexTouch 100 UNIT/ML FlexTouch Pen Generic drug: insulin degludec Inject 9 Units into the skin daily.   Vitamin D3 50 MCG (2000 UT) Tabs Take 2,000 Units by mouth daily.         OBJECTIVE:   Vital Signs: BP 122/70 (BP Location: Left Arm, Patient Position: Sitting, Cuff Size: Small)    Pulse 87    Ht 5\' 10"  (1.778 m)    Wt 150 lb 3.2 oz (68.1 kg)    SpO2 92%    BMI 21.55 kg/m   Wt Readings from Last 3 Encounters:  10/06/21 150 lb 3.2 oz (68.1 kg)  06/30/21 148 lb 9.6 oz (67.4 kg)  02/18/21 142 lb (64.4 kg)     Exam: General: Pt appears well and is in NAD  Neck: General: Supple without adenopathy. Thyroid: Thyroid size normal.  No goiter or nodules appreciated.   Lungs: Clear with good BS bilat with no rales, rhonchi, or wheezes  Heart: RRR  Extremities: No pretibial edema. Left erythematous macular lesion at the base of 4th toe , no tenderness or skin damage   Neuro: MS is good with appropriate affect, pt is alert and Ox3    DM foot exam: 03/24/2021   Left BKA  The skin of the feet is intact without sores or ulcerations. The pedal pulse undetectable on the left  The sensation is decreased      DATA REVIEWED:  Lab Results  Component Value Date   HGBA1C 8.3 (A) 10/06/2021   HGBA1C 7.5 (A) 06/30/2021   HGBA1C 7.5 (H) 02/01/2021   Lab Results  Component Value Date   LDLCALC 58 04/06/2014   CREATININE 0.86 03/24/2021   No results found for: [facility]   Lab Results  Component Value Date   CHOL 117 04/06/2014   HDL 34 (L) 04/06/2014   LDLCALC 58 04/06/2014   TRIG 123 04/06/2014   CHOLHDL 3.4  04/06/2014       CT abdomen 01/31/2021    . Large amount of retained stool throughout the colon consistent with constipation. Large amount of stool within the rectal vault consistent with fecal impaction. 2. Punctate bilateral nonobstructing renal calculi. 3. Indeterminate 10 mm left adrenal nodule, likely adenoma. 4.  Aortic Atherosclerosis (ICD10-I70.0).   ASSESSMENT / PLAN / RECOMMENDATIONS:   1) Type 2 Diabetes Mellitus, Optimally controlled, With neuropathic and macrovascular  complications - Most recent A1c of 8.3 %. Goal A1c < 7.5 %.     - A1c trended up  - His in-office BG 256 mg/dL This am his BG was 150 mg/dL but after eating dip with chips and cappuccino his BG increased to 256 mg/dL. We used this opportunity to discuss importance of low carb diet   -He is still finishing levemir  - Pt with hx of pancreatitis , hence DPP-4 inhibitors and GLP-1 agonists are contra-indicated.  - We discussed increased risk of hypoglycemia with insulin in  conjunction with sulfonylurea , so this would not be a good option for postprandial hypoglycemia  - We discussed options to include pioglitazone ( discussed risk of LE edema and weight gain ) vs prandial insulin. Per daughter, pt will not be consistent in checking glucose before each meal.  - We have opted to try pioglitazone while monitoring for LE edema     MEDICATIONS: Levemir/ Tresiba   10  units daily  Continue Metformin 1000 mg Twice a day  Continue Jardiance 25 mg daily Start Pioglitazone 15 mg daily     EDUCATION / INSTRUCTIONS: BG monitoring instructions: Patient is instructed to check his blood sugars 1 times a day, fasting . Call [facility] if: BG persistently < 70  I reviewed the Rule of 15 for the treatment of hypoglycemia in detail with the patient. Literature supplied.    2) Diabetic complications:  Eye:  Unknown to have diabetic retinopathy.  Neuro/ Feet: Does  have known diabetic peripheral  neuropathy .  Renal: Patient does not have known baseline CKD. He   is  on an ACEI/ARB at present.    3) Left Adrenal Adenoma:   - Hormonal workup has ruled out cushing syndrome, Pheo and hyperaldo 03/2021 - Will repeat adrenal imaging ~ 01/2022   F/U in 4 months     Signed electronically by: Mack Guise, MD  [facility]  [address] [place], [place] [address] Phone: [phone] FAX: [phone]   CC: [name], MD [address] [place] [address] [place] [address] Phone: [phone]  Fax: [phone]  Return to Endocrinology clinic as below: Future Appointments  Date Time Provider [place]  10/15/2021  3:00 PM [facility] VASC 2 - [facility]-H[facility] VVS  10/15/2021  3:40 PM Angelia Mould, MD [facility] VVS  10/28/2021  9:00 AM Rankin, Clent Demark, MD RDE-R[facility] None  11/03/2021  1:30 PM Rankin, Clent Demark, MD RDE-RDE None  11/12/2021  1:15 PM [name], MD OC-[facility] None

## 2021-10-12 DIAGNOSIS — I10 Essential (primary) hypertension: Secondary | ICD-10-CM | POA: Diagnosis not present

## 2021-10-12 DIAGNOSIS — E1342 Other specified diabetes mellitus with diabetic polyneuropathy: Secondary | ICD-10-CM | POA: Diagnosis not present

## 2021-10-12 DIAGNOSIS — E1140 Type 2 diabetes mellitus with diabetic neuropathy, unspecified: Secondary | ICD-10-CM | POA: Diagnosis not present

## 2021-10-12 DIAGNOSIS — E1151 Type 2 diabetes mellitus with diabetic peripheral angiopathy without gangrene: Secondary | ICD-10-CM | POA: Diagnosis not present

## 2021-10-12 DIAGNOSIS — E785 Hyperlipidemia, unspecified: Secondary | ICD-10-CM | POA: Diagnosis not present

## 2021-10-12 DIAGNOSIS — E1165 Type 2 diabetes mellitus with hyperglycemia: Secondary | ICD-10-CM | POA: Diagnosis not present

## 2021-10-12 DIAGNOSIS — I25118 Atherosclerotic heart disease of native coronary artery with other forms of angina pectoris: Secondary | ICD-10-CM | POA: Diagnosis not present

## 2021-10-12 DIAGNOSIS — N401 Enlarged prostate with lower urinary tract symptoms: Secondary | ICD-10-CM | POA: Diagnosis not present

## 2021-10-12 DIAGNOSIS — K219 Gastro-esophageal reflux disease without esophagitis: Secondary | ICD-10-CM | POA: Diagnosis not present

## 2021-10-15 ENCOUNTER — Other Ambulatory Visit: Payer: Self-pay

## 2021-10-15 ENCOUNTER — Ambulatory Visit (INDEPENDENT_AMBULATORY_CARE_PROVIDER_SITE_OTHER): Payer: Medicare Other | Admitting: Vascular Surgery

## 2021-10-15 ENCOUNTER — Ambulatory Visit (HOSPITAL_COMMUNITY)
Admission: RE | Admit: 2021-10-15 | Discharge: 2021-10-15 | Disposition: A | Discharge status: Home / Self Care | Payer: Medicare Other | Source: Ambulatory Visit | Attending: Vascular Surgery | Admitting: Vascular Surgery

## 2021-10-15 ENCOUNTER — Encounter: Payer: Self-pay | Admitting: Vascular Surgery

## 2021-10-15 VITALS — BP 120/66 | HR 75 | Temp 98.4°F | Resp 20 | Ht 70.0 in | Wt 150.0 lb

## 2021-10-15 DIAGNOSIS — I739 Peripheral vascular disease, unspecified: Secondary | ICD-10-CM | POA: Insufficient documentation

## 2021-10-15 NOTE — Progress Notes (Signed)
REASON FOR VISIT:   Follow-up of peripheral vascular disease  MEDICAL ISSUES:   PERIPHERAL VASCULAR DISEASE: He has no significant symptoms in the left leg.  He is quite mobile with his right below the knee prosthesis.  He denies claudication, rest pain, or ulcers on the left.  His ABIs are stable.  I encouraged him to stay as active as possible.  He is not a smoker.  We will stretch his follow-up out to 9 months.  The daughter feels strongly about following him closely which I understand given that he had an amputation on the right.  They would also prefer that he be seen by me rather than on the PA schedule.   HPI:   Keith Espinoza is a pleasant 81 y.o. male who is status post a right below the knee amputation.  He has a history of peripheral vascular disease and we have been following his left leg closely at the request of his daughter.  Since I saw him last, he denies any history of claudication on the left side.  He is ambulatory with his prosthesis and also was driving.  He denies any history of rest pain or nonhealing wounds on the left side.  He is not a smoker.  Past Medical History:  Diagnosis Date   Allergic rhinitis    Allergic rhinitis    Arthritis    Basal cell carcinoma 11/01/2019    bcc left chest treatment TX cx3 37fu    Chronic leg pain    right   Chronic lower back pain    Coronary artery disease    a. Stenting to RCA 2004; staged DES to LAD and Cx 2004. DES to mRCA 2012. b. DES to mCx, PTCA to dCx 11/2011. c. Lateral wall MI 2013 s/p PTCA to distal Cx & DES to mid OM2 11/2011. d. Low risk nuc 04/2014, EF wnl.   COVID-19    Diabetes mellitus    Insulin dependent   Diabetic neuropathy ([place])    MILD   Diverticulosis    Dysrhythmia    [name] syndrome    Gout    right wrist; right foot; right elbow; have had it since 1970's   H/O hiatal hernia    Heart murmur    History of echocardiogram    aortic sclerosis per echo 12/09 EF 65%, otherwise normal   History of  hemorrhoids    BLEEDING   History of kidney stones    h/o   Hypertension    Diagnosed 1995    Myocardial infarction [facility])    Pancreatic pseudocyst    a. s/p remote drainage 2006.   Thrombocytopenia ([place])    Seen on oldest labs in system from 2004   Vitamin B 12 deficiency    orally replaced    Family History  Problem Relation Age of Onset   Diabetes Mother    Hyperlipidemia Mother    Hypertension Mother    Cancer Father    Hypertension Father    Diabetes Sister    Hypertension Sister    Cancer Brother    Heart attack Neg Hx     SOCIAL HISTORY: Social History   Tobacco Use   Smoking status: Former   Smokeless tobacco: Current    Types: Chew   Tobacco comments:    quit 60 years ago  Substance Use Topics   Alcohol use: No    Allergies  Allergen Reactions   Simvastatin Other (See Comments)    SEVERE  MYALGIAS    Zetia [Ezetimibe] Other (See Comments)    MYALGIAS   Dilaudid [Hydromorphone Hcl] Other (See Comments)    hallucination    Current Outpatient Medications  Medication Sig Dispense Refill   acetaminophen (TYLENOL) 500 MG tablet Take 1,000 mg by mouth every 6 (six) hours as needed (pain).     allopurinol (ZYLOPRIM) 300 MG tablet Take 300 mg by mouth daily.     amLODipine (NORVASC) 5 MG tablet Take 1 tablet (5 mg total) by mouth daily. 30 tablet 0   aspirin EC 81 MG tablet Take 81 mg by mouth daily.     Cholecalciferol (VITAMIN D3) 50 MCG (2000 UT) TABS Take 2,000 Units by mouth daily.      clopidogrel (PLAVIX) 75 MG tablet Take 1 tablet (75 mg total) by mouth daily. 30 tablet 0   Cyanocobalamin (B-12) 2500 MCG TABS Take 2,500 mcg by mouth daily.     empagliflozin (JARDIANCE) 25 MG TABS tablet Take 1 tablet (25 mg total) by mouth daily. 90 tablet 3   gabapentin (NEURONTIN) 100 MG capsule Take 2 capsules (200 mg total) by mouth at bedtime. (Patient taking differently: Take 600 mg by mouth at bedtime.) 30 capsule 0   gabapentin (NEURONTIN) 400 MG capsule  Take 1 capsule (400 mg total) by mouth 3 (three) times daily. (Patient taking differently: Take 400 mg by mouth daily.) 90 capsule 0   hydrochlorothiazide (HYDRODIURIL) 12.5 MG tablet Take 1 tablet (12.5 mg total) by mouth daily. 30 tablet 0   insulin degludec (TRESIBA FLEXTOUCH) 100 UNIT/ML FlexTouch Pen Inject 9 Units into the skin daily. 15 mL 3   Insulin Pen Needle 32G X 4 MM MISC 1 Device by Does not apply route daily in the afternoon. 100 each 3   isosorbide mononitrate (IMDUR) 30 MG 24 hr tablet Take 1 tablet (30 mg total) by mouth daily. Please schedule yearly appointment for future refills. Thank you 30 tablet 0   losartan (COZAAR) 50 MG tablet Take 1 tablet (50 mg total) by mouth daily. 30 tablet 0   metFORMIN (GLUCOPHAGE) 1000 MG tablet Take 1 tablet (1,000 mg total) by mouth 2 (two) times daily. 180 tablet 3   nitroGLYCERIN (NITROSTAT) 0.4 MG SL tablet Place 0.4 mg under the tongue every 5 (five) minutes x 3 doses as needed for chest pain.      ONE TOUCH ULTRA TEST test strip CHECK BLOOD SUGAR ONCE DAILY AS DIRECTED  5   pioglitazone (ACTOS) 15 MG tablet Take 1 tablet (15 mg total) by mouth daily. 90 tablet 1   polyethylene glycol (MIRALAX) 17 g packet Take 17 g by mouth 2 (two) times daily. 28 each 3   potassium chloride SA (KLOR-CON) 20 MEQ tablet Take 1 tablet (20 mEq total) by mouth daily. 20 tablet 0   senna-docusate (SENOKOT-S) 8.6-50 MG tablet Take 1 tablet by mouth at bedtime. 30 tablet 2   terazosin (HYTRIN) 5 MG capsule Take 1 capsule (5 mg total) by mouth at bedtime. 30 capsule 0   traMADol (ULTRAM) 50 MG tablet Take 1 tablet (50 mg total) by mouth every 6 (six) hours as needed for moderate pain. 28 tablet 0   traZODone (DESYREL) 50 MG tablet Take 1 tablet (50 mg total) by mouth at bedtime as needed for sleep. (Patient taking differently: Take 50 mg by mouth 2 (two) times daily.)     No current facility-administered medications for this visit.    REVIEW OF SYSTEMS:  [X]   denotes  positive finding, [ ]  denotes negative finding Cardiac  Comments:  Chest pain or chest pressure:    Shortness of breath upon exertion:    Short of breath when lying flat:    Irregular heart rhythm:        Vascular    Pain in calf, thigh, or hip brought on by ambulation:    Pain in feet at night that wakes you up from your sleep:     Blood clot in your veins:    Leg swelling:         Pulmonary    Oxygen at home:    Productive cough:     Wheezing:         Neurologic    Sudden weakness in arms or legs:     Sudden numbness in arms or legs:     Sudden onset of difficulty speaking or slurred speech:    Temporary loss of vision in one eye:     Problems with dizziness:         Gastrointestinal    Blood in stool:     Vomited blood:         Genitourinary    Burning when urinating:     Blood in urine:        Psychiatric    Major depression:         Hematologic    Bleeding problems:    Problems with blood clotting too easily:        Skin    Rashes or ulcers:        Constitutional    Fever or chills:     PHYSICAL EXAM:   Vitals:   10/15/21 1525  BP: 120/66  Pulse: 75  Resp: 20  Temp: 98.4 F (36.9 C)  SpO2: 97%  Weight: 150 lb (68 kg)  Height: 5\' 10"  (1.778 m)    GENERAL: The patient is a well-nourished male, in no acute distress. The vital signs are documented above. CARDIAC: There is a regular rate and rhythm.  VASCULAR: I do not detect carotid bruits. He has palpable femoral pulses. I cannot palpate pedal pulses on the left however he has brisk monophasic signals in the dorsalis pedis and posterior tibial positions. He has no significant lower extremity swelling. He has no ischemic ulcers. PULMONARY: There is good air exchange bilaterally without wheezing or rales. MUSCULOSKELETAL: He has a right below the knee amputation. NEUROLOGIC: No focal weakness or paresthesias are detected. SKIN: There are no ulcers or rashes noted. PSYCHIATRIC: The patient  has a normal affect.  DATA:    ARTERIAL DOPPLER STUDY: I have independently interpreted his arterial Doppler study today.  This was of the left lower extremity only.  He has a right below the knee amputation.  On the left side he has a monophasic dorsalis pedis and posterior tibial signal.  ABI is 70%.  Toe pressure is 53 mmHg.  [name] [facility] [phone]

## 2021-10-16 ENCOUNTER — Other Ambulatory Visit: Payer: Self-pay | Admitting: *Deleted

## 2021-10-16 DIAGNOSIS — I739 Peripheral vascular disease, unspecified: Secondary | ICD-10-CM

## 2021-10-27 ENCOUNTER — Encounter (INDEPENDENT_AMBULATORY_CARE_PROVIDER_SITE_OTHER): Payer: Medicare Other | Admitting: Ophthalmology

## 2021-10-28 ENCOUNTER — Other Ambulatory Visit: Payer: Self-pay

## 2021-10-28 ENCOUNTER — Encounter (INDEPENDENT_AMBULATORY_CARE_PROVIDER_SITE_OTHER): Payer: Self-pay | Admitting: Ophthalmology

## 2021-10-28 ENCOUNTER — Ambulatory Visit (INDEPENDENT_AMBULATORY_CARE_PROVIDER_SITE_OTHER): Payer: Medicare Other | Admitting: Ophthalmology

## 2021-10-28 DIAGNOSIS — H353221 Exudative age-related macular degeneration, left eye, with active choroidal neovascularization: Secondary | ICD-10-CM | POA: Diagnosis not present

## 2021-10-28 DIAGNOSIS — H353211 Exudative age-related macular degeneration, right eye, with active choroidal neovascularization: Secondary | ICD-10-CM | POA: Diagnosis not present

## 2021-10-28 MED ORDER — BEVACIZUMAB 2.5 MG/0.1ML IZ SOSY
2.5000 mg | PREFILLED_SYRINGE | INTRAVITREAL | Status: AC | PRN
  Administered 2021-10-28: 10:00:00 2.5 mg via INTRAVITREAL

## 2021-10-28 NOTE — Progress Notes (Signed)
10/28/2021     CHIEF COMPLAINT Patient presents for  Chief Complaint  Patient presents with   Retina Follow Up   For wet macular degeneration in each eye, currently at 7-week follow-up interval OS today after injection Avastin   HISTORY OF PRESENT ILLNESS: Keith Espinoza is a 81 y.o. male who presents to the clinic today for:   HPI     Retina Follow Up           Diagnosis: Wet AMD   Onset: 6 weeks ago   Severity: mild   Duration: 6 weeks   Course: stable         Comments   6 weeks dilate OS, Avastin OS OCT. Patient states vision is stable and unchanged since last visit. Denies any new floaters or FOL.       Last edited by Laurin Coder on 10/28/2021  9:17 AM.      Referring physician: [name], MD [address] [place],  [place] [address]  HISTORICAL INFORMATION:   Selected notes from the MEDICAL RECORD NUMBER    Lab Results  Component Value Date   HGBA1C 8.3 (A) 10/06/2021     CURRENT MEDICATIONS: No current outpatient medications on file. (Ophthalmic Drugs)   No current facility-administered medications for this visit. (Ophthalmic Drugs)   Current Outpatient Medications (Other)  Medication Sig   acetaminophen (TYLENOL) 500 MG tablet Take 1,000 mg by mouth every 6 (six) hours as needed (pain).   allopurinol (ZYLOPRIM) 300 MG tablet Take 300 mg by mouth daily.   amLODipine (NORVASC) 5 MG tablet Take 1 tablet (5 mg total) by mouth daily.   aspirin EC 81 MG tablet Take 81 mg by mouth daily.   Cholecalciferol (VITAMIN D3) 50 MCG (2000 UT) TABS Take 2,000 Units by mouth daily.    clopidogrel (PLAVIX) 75 MG tablet Take 1 tablet (75 mg total) by mouth daily.   Cyanocobalamin (B-12) 2500 MCG TABS Take 2,500 mcg by mouth daily.   empagliflozin (JARDIANCE) 25 MG TABS tablet Take 1 tablet (25 mg total) by mouth daily.   gabapentin (NEURONTIN) 100 MG capsule Take 2 capsules (200 mg total) by mouth at bedtime. (Patient taking differently:  Take 600 mg by mouth at bedtime.)   gabapentin (NEURONTIN) 400 MG capsule Take 1 capsule (400 mg total) by mouth 3 (three) times daily. (Patient taking differently: Take 400 mg by mouth daily.)   hydrochlorothiazide (HYDRODIURIL) 12.5 MG tablet Take 1 tablet (12.5 mg total) by mouth daily.   insulin degludec (TRESIBA FLEXTOUCH) 100 UNIT/ML FlexTouch Pen Inject 9 Units into the skin daily.   Insulin Pen Needle 32G X 4 MM MISC 1 Device by Does not apply route daily in the afternoon.   isosorbide mononitrate (IMDUR) 30 MG 24 hr tablet Take 1 tablet (30 mg total) by mouth daily. Please schedule yearly appointment for future refills. Thank you   losartan (COZAAR) 50 MG tablet Take 1 tablet (50 mg total) by mouth daily.   metFORMIN (GLUCOPHAGE) 1000 MG tablet Take 1 tablet (1,000 mg total) by mouth 2 (two) times daily.   nitroGLYCERIN (NITROSTAT) 0.4 MG SL tablet Place 0.4 mg under the tongue every 5 (five) minutes x 3 doses as needed for chest pain.    ONE TOUCH ULTRA TEST test strip CHECK BLOOD SUGAR ONCE DAILY AS DIRECTED   pioglitazone (ACTOS) 15 MG tablet Take 1 tablet (15 mg total) by mouth daily.   polyethylene glycol (MIRALAX) 17 g  packet Take 17 g by mouth 2 (two) times daily.   potassium chloride SA (KLOR-CON) 20 MEQ tablet Take 1 tablet (20 mEq total) by mouth daily.   senna-docusate (SENOKOT-S) 8.6-50 MG tablet Take 1 tablet by mouth at bedtime.   terazosin (HYTRIN) 5 MG capsule Take 1 capsule (5 mg total) by mouth at bedtime.   traMADol (ULTRAM) 50 MG tablet Take 1 tablet (50 mg total) by mouth every 6 (six) hours as needed for moderate pain.   traZODone (DESYREL) 50 MG tablet Take 1 tablet (50 mg total) by mouth at bedtime as needed for sleep. (Patient taking differently: Take 50 mg by mouth 2 (two) times daily.)   No current facility-administered medications for this visit. (Other)      REVIEW OF SYSTEMS: ROS   Negative for: Constitutional, Gastrointestinal, Neurological, Skin,  Genitourinary, Musculoskeletal, HENT, Endocrine, Cardiovascular, Eyes, Respiratory, Psychiatric, Allergic/Imm, Heme/Lymph Last edited by Hurman Horn, MD on 10/28/2021 10:11 AM.       ALLERGIES Allergies  Allergen Reactions   Simvastatin Other (See Comments)    SEVERE MYALGIAS    Zetia [Ezetimibe] Other (See Comments)    MYALGIAS   Dilaudid [Hydromorphone Hcl] Other (See Comments)    hallucination    PAST MEDICAL HISTORY Past Medical History:  Diagnosis Date   Allergic rhinitis    Allergic rhinitis    Arthritis    Basal cell carcinoma 11/01/2019    bcc left chest treatment TX cx3 72fu    Chronic leg pain    right   Chronic lower back pain    Coronary artery disease    a. Stenting to RCA 2004; staged DES to LAD and Cx 2004. DES to mRCA 2012. b. DES to mCx, PTCA to dCx 11/2011. c. Lateral wall MI 2013 s/p PTCA to distal Cx & DES to mid OM2 11/2011. d. Low risk nuc 04/2014, EF wnl.   COVID-19    Diabetes mellitus    Insulin dependent   Diabetic neuropathy ([place])    MILD   Diverticulosis    Dysrhythmia    [name] syndrome    Gout    right wrist; right foot; right elbow; have had it since 1970's   H/O hiatal hernia    Heart murmur    History of echocardiogram    aortic sclerosis per echo 12/09 EF 65%, otherwise normal   History of hemorrhoids    BLEEDING   History of kidney stones    h/o   Hypertension    Diagnosed 1995    Myocardial infarction [facility])    Pancreatic pseudocyst    a. s/p remote drainage 2006.   Thrombocytopenia ([place])    Seen on oldest labs in system from 2004   Vitamin B 12 deficiency    orally replaced   Past Surgical History:  Procedure Laterality Date   ABDOMINAL AORTOGRAM W/LOWER EXTREMITY Bilateral 08/08/2020   Procedure: ABDOMINAL AORTOGRAM W/LOWER EXTREMITY;  Surgeon: Angelia Mould, MD;  Location: [facility] [facility];  Service: Cardiovascular;  Laterality: Bilateral;   AMPUTATION Right 12/12/2020   Procedure: RIGHT BELOW KNEE  AMPUTATION;  Surgeon: [name], MD;  Location: [place];  Service: Orthopedics;  Laterality: Right;   BACK SURGERY     "total of 3 times" S/P fall    CARPAL TUNNEL RELEASE Bilateral    CHOLECYSTECTOMY  1990's   COLONOSCOPY     CORONARY ANGIOPLASTY  11/11/11   CORONARY ANGIOPLASTY WITH STENT PLACEMENT  09/30/2011   "1 then; makes  a total of 4"   CORONARY ANGIOPLASTY WITH STENT PLACEMENT  11/11/11   "1; makes a total of 5"   INGUINAL HERNIA REPAIR  2003   right   JOINT REPLACEMENT Right 04/03/2002   hip replacment   KNEE ARTHROSCOPY  1990's   left   LEFT HEART CATHETERIZATION WITH CORONARY ANGIOGRAM N/A 09/30/2011   Procedure: LEFT HEART CATHETERIZATION WITH CORONARY ANGIOGRAM;  Surgeon: Jettie Booze, MD;  Location: [facility] CATH LAB;  Service: Cardiovascular;  Laterality: N/A;  possible PCI   LEFT HEART CATHETERIZATION WITH CORONARY ANGIOGRAM N/A 11/15/2011   Procedure: LEFT HEART CATHETERIZATION WITH CORONARY ANGIOGRAM;  Surgeon: Jettie Booze, MD;  Location: [facility] CATH LAB;  Service: Cardiovascular;  Laterality: N/A;   PERCUTANEOUS CORONARY STENT INTERVENTION (PCI-S)  09/30/2011   Procedure: PERCUTANEOUS CORONARY STENT INTERVENTION (PCI-S);  Surgeon: Jettie Booze, MD;  Location: [facility] CATH LAB;  Service: Cardiovascular;;   PERCUTANEOUS CORONARY STENT INTERVENTION (PCI-S) N/A 11/11/2011   Procedure: PERCUTANEOUS CORONARY STENT INTERVENTION (PCI-S);  Surgeon: Jettie Booze, MD;  Location: [facility] CATH LAB;  Service: Cardiovascular;  Laterality: N/A;   PERIPHERAL VASCULAR BALLOON ANGIOPLASTY Right 08/08/2020   Procedure: PERIPHERAL VASCULAR BALLOON ANGIOPLASTY;  Surgeon: Angelia Mould, MD;  Location: [place] [facility];  Service: Cardiovascular;  Laterality: Right;  Posterior tibial    SHOULDER SURGERY Right    X 2   STUMP REVISION Right 01/09/2021   Procedure: REVISION RIGHT BELOW KNEE AMPUTATION;  Surgeon: [name], MD;  Location: [place];  Service: Orthopedics;   Laterality: Right;   TONSILLECTOMY  ~ [place] Right 04/13/2019   Procedure: RIGHT TOTAL HIP REVISION-POSTERIOR  APPROACH LATERAL;  Surgeon: Marybelle Killings, MD;  Location: [place];  Service: Orthopedics;  Laterality: Right;   TOTAL KNEE ARTHROPLASTY Left 07/23/2016   Procedure: LEFT TOTAL KNEE ARTHROPLASTY;  Surgeon: Marybelle Killings, MD;  Location: [place];  Service: Orthopedics;  Laterality: Left;    FAMILY HISTORY Family History  Problem Relation Age of Onset   Diabetes Mother    Hyperlipidemia Mother    Hypertension Mother    Cancer Father    Hypertension Father    Diabetes Sister    Hypertension Sister    Cancer Brother    Heart attack Neg Hx     SOCIAL HISTORY Social History   Tobacco Use   Smoking status: Former   Smokeless tobacco: Current    Types: Chew   Tobacco comments:    quit 60 years ago  Vaping Use   Vaping Use: Never used  Substance Use Topics   Alcohol use: No   Drug use: No         OPHTHALMIC EXAM:  Base Eye Exam     Visual Acuity (ETDRS)       Right Left   Dist cc 20/20 -1 20/30 -2         Tonometry (Tonopen, 9:18 AM)       Right Left   Pressure 14 12         Pupils       Pupils Dark Light Shape APD   Right PERRL 2.5 2 Round None   Left PERRL 2.5 2 Round None         Extraocular Movement       Right Left    Full Full         Neuro/Psych     Oriented x3: Yes   Mood/Affect: Normal  Dilation     Left eye: 1.0% Mydriacyl, 2.5% Phenylephrine @ 9:18 AM           Slit Lamp and Fundus Exam     External Exam       Right Left   External Normal Normal         Slit Lamp Exam       Right Left   Lids/Lashes Normal Normal   Conjunctiva/Sclera White and quiet White and quiet   Cornea Clear Clear   Anterior Chamber Deep and quiet Deep and quiet   Iris Round and reactive Round and reactive   Lens Centered posterior chamber intraocular lens Centered posterior chamber intraocular lens    Anterior Vitreous Normal Normal         Fundus Exam       Right Left   Posterior Vitreous  Normal   Disc  Normal   C/D Ratio 2.5 2.5   Macula  Retinal pigment epithelial mottling, Intermediate age related macular degeneration   Vessels  Normal, no DR   Periphery  Normal            IMAGING AND PROCEDURES  Imaging and Procedures for 10/28/21  Intravitreal Injection, Pharmacologic Agent - OS - Left Eye       Time Out 10/28/2021. 10:15 AM. Confirmed correct patient, procedure, site, and patient consented.   Anesthesia Topical anesthesia was used. Anesthetic medications included Lidocaine 4%.   Procedure Preparation included 5% betadine to ocular surface, 10% betadine to eyelids, Tobramycin 0.3%. A 30 gauge needle was used.   Injection: 2.5 mg bevacizumab 2.5 MG/0.1ML   Route: Intravitreal, Site: Left Eye   NDC: [phone], Lot: [id number]   Post-op Post injection exam found visual acuity of at least counting fingers. The patient tolerated the procedure well. There were no complications. The patient received written and verbal post procedure care education. Post injection medications included ocuflox.      OCT, Retina - OU - Both Eyes       Right Eye Quality was borderline. Scan locations included subfoveal. Central Foveal Thickness: 261. Progression has worsened. Findings include abnormal foveal contour, subretinal scarring, inner retinal atrophy, intraretinal hyper-reflective material, intraretinal fluid.   Left Eye Quality was borderline. Scan locations included subfoveal. Central Foveal Thickness: 236. Progression has been stable. Findings include abnormal foveal contour, retinal drusen , intraretinal hyper-reflective material, intraretinal fluid.   Notes OD much vastly improved OD post injection Avastin.  Currently at 6 weeks.  Repeat injection OD  follow-up as scheduled.   OS, macular findings also vastly improved currently at 7-week follow-up.  Repeat  injection OS today and maintain 7 to 8-week follow-up OS             ASSESSMENT/PLAN:  Exudative age-related macular degeneration of left eye with active choroidal neovascularization ([place]) Today at 7-week follow-up interval.  Repeat injection today and maintain 7-week follow-up OS  Exudative age-related macular degeneration of right eye with active choroidal neovascularization ([place]) OD today at 6-week follow-up, follow-up as scheduled OD     ICD-10-CM   1. Exudative age-related macular degeneration of left eye with active choroidal neovascularization ([facility])  H35.3221 Intravitreal Injection, Pharmacologic Agent - OS - Left Eye    OCT, Retina - OU - Both Eyes    bevacizumab (AVASTIN) SOSY 2.5 mg    2. Exudative age-related macular degeneration of right eye with active choroidal neovascularization ([place])  H35.3211       1.  OS vastly improved lesion inferior  to FAZ, much less intraretinal fluid as compared to last visit 7 weeks previous.  Repeat injection today maintain 7-week follow-up for period of time to induce resolution on hopefully permanent basis  2.  3.  Ophthalmic Meds Ordered this visit:  Meds ordered this encounter  Medications   bevacizumab (AVASTIN) SOSY 2.5 mg       Return in about 7 weeks (around 12/16/2021) for dilate, OS, AVASTIN OCT,,, and follow-up OD Avastin OCT is scheduled.  There are no Patient Instructions on file for this visit.   Explained the diagnoses, plan, and follow up with the patient and they expressed understanding.  Patient expressed understanding of the importance of proper follow up care.   Clent Demark Kadin Canipe M.D. Diseases & Surgery of the Retina and Vitreous [facility] [place] 10/28/21     Abbreviations: M myopia (nearsighted); A astigmatism; H hyperopia (farsighted); P presbyopia; Mrx spectacle prescription;  CTL contact lenses; OD right eye; OS left eye; OU both eyes  XT exotropia; ET esotropia; PEK punctate epithelial  keratitis; PEE punctate epithelial erosions; DES dry eye syndrome; MGD meibomian gland dysfunction; ATs artificial tears; PFAT's preservative free artificial tears; [place] nuclear sclerotic cataract; PSC posterior subcapsular cataract; ERM epi-retinal membrane; PVD posterior vitreous detachment; RD retinal detachment; DM diabetes mellitus; DR diabetic retinopathy; NPDR non-proliferative diabetic retinopathy; PDR proliferative diabetic retinopathy; CSME clinically significant macular edema; DME diabetic macular edema; dbh dot blot hemorrhages; CWS cotton wool spot; POAG primary open angle glaucoma; C/D cup-to-disc ratio; HVF humphrey visual field; GVF goldmann visual field; OCT optical coherence tomography; IOP intraocular pressure; BRVO Branch retinal vein occlusion; CRVO central retinal vein occlusion; CRAO central retinal artery occlusion; BRAO branch retinal artery occlusion; RT retinal tear; SB scleral buckle; PPV pars plana vitrectomy; VH Vitreous hemorrhage; PRP panretinal laser photocoagulation; IVK intravitreal kenalog; VMT vitreomacular traction; MH Macular hole;  NVD neovascularization of the disc; NVE neovascularization elsewhere; AREDS age related eye disease study; ARMD age related macular degeneration; POAG primary open angle glaucoma; EBMD epithelial/anterior basement membrane dystrophy; ACIOL anterior chamber intraocular lens; IOL intraocular lens; PCIOL posterior chamber intraocular lens; Phaco/IOL phacoemulsification with intraocular lens placement; [place] photorefractive keratectomy; LASIK laser assisted in situ keratomileusis; HTN hypertension; DM diabetes mellitus; COPD chronic obstructive pulmonary disease

## 2021-10-28 NOTE — Assessment & Plan Note (Signed)
OD today at 6-week follow-up, follow-up as scheduled OD

## 2021-10-28 NOTE — Assessment & Plan Note (Signed)
Today at 7-week follow-up interval.  Repeat injection today and maintain 7-week follow-up OS

## 2021-11-03 ENCOUNTER — Other Ambulatory Visit: Payer: Self-pay

## 2021-11-03 ENCOUNTER — Encounter (INDEPENDENT_AMBULATORY_CARE_PROVIDER_SITE_OTHER): Payer: Self-pay | Admitting: Ophthalmology

## 2021-11-03 ENCOUNTER — Ambulatory Visit (INDEPENDENT_AMBULATORY_CARE_PROVIDER_SITE_OTHER): Payer: Medicare Other | Admitting: Ophthalmology

## 2021-11-03 DIAGNOSIS — H353211 Exudative age-related macular degeneration, right eye, with active choroidal neovascularization: Secondary | ICD-10-CM

## 2021-11-03 DIAGNOSIS — H353221 Exudative age-related macular degeneration, left eye, with active choroidal neovascularization: Secondary | ICD-10-CM

## 2021-11-03 MED ORDER — BEVACIZUMAB 2.5 MG/0.1ML IZ SOSY
2.5000 mg | PREFILLED_SYRINGE | INTRAVITREAL | Status: AC | PRN
  Administered 2021-11-03: 2.5 mg via INTRAVITREAL

## 2021-11-03 NOTE — Progress Notes (Signed)
11/03/2021     CHIEF COMPLAINT Patient presents for  Chief Complaint  Patient presents with   Retina Follow Up      HISTORY OF PRESENT ILLNESS: Keith Espinoza is a 80 y.o. male who presents to the clinic today for:   HPI     Retina Follow Up           Diagnosis: Wet AMD   Onset: 6 weeks ago   Severity: mild   Duration: 6 weeks   Course: stable         Comments   6 weeks 5 days dilate OD, Avastin OD OCT. Patient states vision is stable and unchanged since last visit. Denies any new floaters or FOL.       Last edited by Laurin Coder on 11/03/2021  1:33 PM.      Referring physician: [name], MD [address] [place],  [place] [address]  HISTORICAL INFORMATION:   Selected notes from the MEDICAL RECORD NUMBER    Lab Results  Component Value Date   HGBA1C 8.3 (A) 10/06/2021     CURRENT MEDICATIONS: No current outpatient medications on file. (Ophthalmic Drugs)   No current facility-administered medications for this visit. (Ophthalmic Drugs)   Current Outpatient Medications (Other)  Medication Sig   acetaminophen (TYLENOL) 500 MG tablet Take 1,000 mg by mouth every 6 (six) hours as needed (pain).   allopurinol (ZYLOPRIM) 300 MG tablet Take 300 mg by mouth daily.   amLODipine (NORVASC) 5 MG tablet Take 1 tablet (5 mg total) by mouth daily.   aspirin EC 81 MG tablet Take 81 mg by mouth daily.   Cholecalciferol (VITAMIN D3) 50 MCG (2000 UT) TABS Take 2,000 Units by mouth daily.    clopidogrel (PLAVIX) 75 MG tablet Take 1 tablet (75 mg total) by mouth daily.   Cyanocobalamin (B-12) 2500 MCG TABS Take 2,500 mcg by mouth daily.   empagliflozin (JARDIANCE) 25 MG TABS tablet Take 1 tablet (25 mg total) by mouth daily.   gabapentin (NEURONTIN) 100 MG capsule Take 2 capsules (200 mg total) by mouth at bedtime. (Patient taking differently: Take 600 mg by mouth at bedtime.)   gabapentin (NEURONTIN) 400 MG capsule Take 1 capsule (400 mg total)  by mouth 3 (three) times daily. (Patient taking differently: Take 400 mg by mouth daily.)   hydrochlorothiazide (HYDRODIURIL) 12.5 MG tablet Take 1 tablet (12.5 mg total) by mouth daily.   insulin degludec (TRESIBA FLEXTOUCH) 100 UNIT/ML FlexTouch Pen Inject 9 Units into the skin daily.   Insulin Pen Needle 32G X 4 MM MISC 1 Device by Does not apply route daily in the afternoon.   isosorbide mononitrate (IMDUR) 30 MG 24 hr tablet Take 1 tablet (30 mg total) by mouth daily. Please schedule yearly appointment for future refills. Thank you   losartan (COZAAR) 50 MG tablet Take 1 tablet (50 mg total) by mouth daily.   metFORMIN (GLUCOPHAGE) 1000 MG tablet Take 1 tablet (1,000 mg total) by mouth 2 (two) times daily.   nitroGLYCERIN (NITROSTAT) 0.4 MG SL tablet Place 0.4 mg under the tongue every 5 (five) minutes x 3 doses as needed for chest pain.    ONE TOUCH ULTRA TEST test strip CHECK BLOOD SUGAR ONCE DAILY AS DIRECTED   pioglitazone (ACTOS) 15 MG tablet Take 1 tablet (15 mg total) by mouth daily.   polyethylene glycol (MIRALAX) 17 g packet Take 17 g by mouth 2 (two) times daily.   potassium chloride  SA (KLOR-CON) 20 MEQ tablet Take 1 tablet (20 mEq total) by mouth daily.   senna-docusate (SENOKOT-S) 8.6-50 MG tablet Take 1 tablet by mouth at bedtime.   terazosin (HYTRIN) 5 MG capsule Take 1 capsule (5 mg total) by mouth at bedtime.   traMADol (ULTRAM) 50 MG tablet Take 1 tablet (50 mg total) by mouth every 6 (six) hours as needed for moderate pain.   traZODone (DESYREL) 50 MG tablet Take 1 tablet (50 mg total) by mouth at bedtime as needed for sleep. (Patient taking differently: Take 50 mg by mouth 2 (two) times daily.)   No current facility-administered medications for this visit. (Other)      REVIEW OF SYSTEMS: ROS   Negative for: Constitutional, Gastrointestinal, Neurological, Skin, Genitourinary, Musculoskeletal, HENT, Endocrine, Cardiovascular, Eyes, Respiratory, Psychiatric,  Allergic/Imm, Heme/Lymph Last edited by Hurman Horn, MD on 11/03/2021  2:21 PM.       ALLERGIES Allergies  Allergen Reactions   Simvastatin Other (See Comments)    SEVERE MYALGIAS    Zetia [Ezetimibe] Other (See Comments)    MYALGIAS   Dilaudid [Hydromorphone Hcl] Other (See Comments)    hallucination    PAST MEDICAL HISTORY Past Medical History:  Diagnosis Date   Allergic rhinitis    Allergic rhinitis    Arthritis    Basal cell carcinoma 11/01/2019    bcc left chest treatment TX cx3 [id number]    Chronic leg pain    right   Chronic lower back pain    Coronary artery disease    a. Stenting to RCA 2004; staged DES to LAD and Cx 2004. DES to mRCA 2012. b. DES to mCx, PTCA to dCx 11/2011. c. Lateral wall MI 2013 s/p PTCA to distal Cx & DES to mid OM2 11/2011. d. Low risk nuc 04/2014, EF wnl.   COVID-19    Diabetes mellitus    Insulin dependent   Diabetic neuropathy ([place])    MILD   Diverticulosis    Dysrhythmia    [name] syndrome    Gout    right wrist; right foot; right elbow; have had it since 1970's   H/O hiatal hernia    Heart murmur    History of echocardiogram    aortic sclerosis per echo 12/09 EF 65%, otherwise normal   History of hemorrhoids    BLEEDING   History of kidney stones    h/o   Hypertension    Diagnosed 1995    Myocardial infarction [facility])    Pancreatic pseudocyst    a. s/p remote drainage 2006.   Thrombocytopenia ([place])    Seen on oldest labs in system from 2004   Vitamin B 12 deficiency    orally replaced   Past Surgical History:  Procedure Laterality Date   ABDOMINAL AORTOGRAM W/LOWER EXTREMITY Bilateral 08/08/2020   Procedure: ABDOMINAL AORTOGRAM W/LOWER EXTREMITY;  Surgeon: Angelia Mould, MD;  Location: [place] [facility];  Service: Cardiovascular;  Laterality: Bilateral;   AMPUTATION Right 12/12/2020   Procedure: RIGHT BELOW KNEE AMPUTATION;  Surgeon: [name], MD;  Location: [place];  Service: Orthopedics;  Laterality: Right;    BACK SURGERY     "total of 3 times" S/P fall    CARPAL TUNNEL RELEASE Bilateral    CHOLECYSTECTOMY  1990's   COLONOSCOPY     CORONARY ANGIOPLASTY  11/11/11   CORONARY ANGIOPLASTY WITH STENT PLACEMENT  09/30/2011   "1 then; makes a total of 4"   CORONARY ANGIOPLASTY WITH STENT PLACEMENT  11/11/11   "  1; makes a total of 5"   INGUINAL HERNIA REPAIR  2003   right   JOINT REPLACEMENT Right 04/03/2002   hip replacment   KNEE ARTHROSCOPY  1990's   left   LEFT HEART CATHETERIZATION WITH CORONARY ANGIOGRAM N/A 09/30/2011   Procedure: LEFT HEART CATHETERIZATION WITH CORONARY ANGIOGRAM;  Surgeon: Jettie Booze, MD;  Location[facility] CATH LAB;  Service: Cardiovascular;  Laterality: N/A;  possible PCI   LEFT HEART CATHETERIZATION WITH CORONARY ANGIOGRAM N/A 11/15/2011   Procedure: LEFT HEART CATHETERIZATION WITH CORONARY ANGIOGRAM;  Surgeon: Jettie Booze, MD;  Location: [facility] CATH LAB;  Service: Cardiovascular;  Laterality: N/A;   PERCUTANEOUS CORONARY STENT INTERVENTION (PCI-S)  09/30/2011   Procedure: PERCUTANEOUS CORONARY STENT INTERVENTION (PCI-S);  Surgeon: Jettie Booze, MD;  Location: [facility] CATH LAB;  Service: Cardiovascular;;   PERCUTANEOUS CORONARY STENT INTERVENTION (PCI-S) N/A 11/11/2011   Procedure: PERCUTANEOUS CORONARY STENT INTERVENTION (PCI-S);  Surgeon: Jettie Booze, MD;  Location: [facility] CATH LAB;  Service: Cardiovascular;  Laterality: N/A;   PERIPHERAL VASCULAR BALLOON ANGIOPLASTY Right 08/08/2020   Procedure: PERIPHERAL VASCULAR BALLOON ANGIOPLASTY;  Surgeon: Angelia Mould, MD;  Location: [place] [facility];  Service: Cardiovascular;  Laterality: Right;  Posterior tibial    SHOULDER SURGERY Right    X 2   STUMP REVISION Right 01/09/2021   Procedure: REVISION RIGHT BELOW KNEE AMPUTATION;  Surgeon: [name], MD;  Location: [place];  Service: Orthopedics;  Laterality: Right;   TONSILLECTOMY  ~ [place] Right 04/13/2019   Procedure: RIGHT TOTAL HIP  REVISION-POSTERIOR  APPROACH LATERAL;  Surgeon: Marybelle Killings, MD;  Location: [place];  Service: Orthopedics;  Laterality: Right;   TOTAL KNEE ARTHROPLASTY Left 07/23/2016   Procedure: LEFT TOTAL KNEE ARTHROPLASTY;  Surgeon: Marybelle Killings, MD;  Location: [place];  Service: Orthopedics;  Laterality: Left;    FAMILY HISTORY Family History  Problem Relation Age of Onset   Diabetes Mother    Hyperlipidemia Mother    Hypertension Mother    Cancer Father    Hypertension Father    Diabetes Sister    Hypertension Sister    Cancer Brother    Heart attack Neg Hx     SOCIAL HISTORY Social History   Tobacco Use   Smoking status: Former   Smokeless tobacco: Current    Types: Chew   Tobacco comments:    quit 60 years ago  Vaping Use   Vaping Use: Never used  Substance Use Topics   Alcohol use: No   Drug use: No         OPHTHALMIC EXAM:  Base Eye Exam     Visual Acuity (ETDRS)       Right Left   Dist cc 20/25 +2 20/40 -1+2   Dist ph cc  20/30 -1         Tonometry (Tonopen, 1:35 PM)       Right Left   Pressure 10 10         Pupils       Dark Light APD   Right 2.5 2 None   Left 2.5 2 None         Extraocular Movement       Right Left    Full Full         Neuro/Psych     Oriented x3: Yes   Mood/Affect: Normal         Dilation     Right eye:  1.0% Mydriacyl, 2.5% Phenylephrine @ 1:35 PM           Slit Lamp and Fundus Exam     External Exam       Right Left   External Normal Normal         Slit Lamp Exam       Right Left   Lids/Lashes Normal Normal   Conjunctiva/Sclera White and quiet White and quiet   Cornea Clear Clear   Anterior Chamber Deep and quiet Deep and quiet   Iris Round and reactive Round and reactive   Lens Centered posterior chamber intraocular lens Centered posterior chamber intraocular lens   Anterior Vitreous Normal Normal         Fundus Exam       Right Left   Posterior Vitreous Normal    Disc Normal     C/D Ratio 0.25    Macula Retinal pigment epithelial mottling, Intermediate age related macular degeneration    Vessels Normal, no DR    Periphery Normal             IMAGING AND PROCEDURES  Imaging and Procedures for 11/03/21  Intravitreal Injection, Pharmacologic Agent - OD - Right Eye       Time Out 11/03/2021. 2:23 PM. Confirmed correct patient, procedure, site, and patient consented.   Anesthesia Topical anesthesia was used. Anesthetic medications included Lidocaine 4%.   Procedure Preparation included Tobramycin 0.3%, 10% betadine to eyelids, 5% betadine to ocular surface. A 30 gauge needle was used.   Injection: 2.5 mg bevacizumab 2.5 MG/0.1ML   Route: Intravitreal, Site: Right Eye   NDC: [phone], Lot: [id number]   Post-op Post injection exam found visual acuity of at least counting fingers. The patient tolerated the procedure well. There were no complications. The patient received written and verbal post procedure care education. Post injection medications included ocuflox.      OCT, Retina - OU - Both Eyes       Right Eye Quality was borderline. Scan locations included subfoveal. Central Foveal Thickness: 256. Progression has worsened. Findings include abnormal foveal contour, subretinal scarring, inner retinal atrophy, intraretinal hyper-reflective material, intraretinal fluid.   Left Eye Quality was borderline. Scan locations included subfoveal. Central Foveal Thickness: 237. Progression has been stable. Findings include abnormal foveal contour, retinal drusen , intraretinal hyper-reflective material, intraretinal fluid.   Notes OD much vastly improved OD post injection Avastin.  Currently at  weeks.  Repeat injection OD today  OS, macular findings also vastly improved currently at 1-week follow-up.  Repeat evaluation as scheduled             ASSESSMENT/PLAN:  Exudative age-related macular degeneration of right eye with active choroidal  neovascularization (HCC) OD, overall vastly improved macular findings and less active CNVM today currently at interval of examination nearing 7 weeks.  Repeat injection today and examination next again in 7-week  Exudative age-related macular degeneration of left eye with active choroidal neovascularization ([place]) The nature of wet macular degeneration was discussed with the patient.  Forms of therapy reviewed include the use of Anti-VEGF medications injected painlessly into the eye, as well as other possible treatment modalities, including thermal laser therapy. Fellow eye involvement and risks were discussed with the patient. Upon the finding of wet age related macular degeneration, treatment will be offered. The treatment regimen is on a treat as needed basis with the intent to treat if necessary and extend interval of exams when possible. On average 1 out of 6 patients  do not need lifetime therapy. However, the risk of recurrent disease is high for a lifetime.  Initially monthly, then periodic, examinations and evaluations will determine whether the next treatment is required on the day of the examination.  OS, 1 week post most recent injection follow-up with     ICD-10-CM   1. Exudative age-related macular degeneration of right eye with active choroidal neovascularization (HCC)  H35.3211 Intravitreal Injection, Pharmacologic Agent - OD - Right Eye    OCT, Retina - OU - Both Eyes    bevacizumab (AVASTIN) SOSY 2.5 mg    2. Exudative age-related macular degeneration of left eye with active choroidal neovascularization ([place])  H35.3221       1.  OD, vastly improved overall currently at 7-week follow-up.  Repeat injection Avastin today follow-up next in 7 to 8-week  2.  OS follow-up as scheduled now 1 week post most recent injection  3.  Ophthalmic Meds Ordered this visit:  Meds ordered this encounter  Medications   bevacizumab (AVASTIN) SOSY 2.5 mg       Return in about 7 weeks (around  12/22/2021) for dilate, OD, AVASTIN OCT,,, OS 1 week post most recent injection follow-up as scheduled.  There are no Patient Instructions on file for this visit.   Explained the diagnoses, plan, and follow up with the patient and they expressed understanding.  Patient expressed understanding of the importance of proper follow up care.   Clent Demark Tabbetha Kutscher M.D. Diseases & Surgery of the Retina and Vitreous Retina & Diabetic [place] 11/03/21     Abbreviations: M myopia (nearsighted); A astigmatism; H hyperopia (farsighted); P presbyopia; Mrx spectacle prescription;  CTL contact lenses; OD right eye; OS left eye; OU both eyes  XT exotropia; ET esotropia; PEK punctate epithelial keratitis; PEE punctate epithelial erosions; DES dry eye syndrome; MGD meibomian gland dysfunction; ATs artificial tears; PFAT's preservative free artificial tears; [place] nuclear sclerotic cataract; PSC posterior subcapsular cataract; ERM epi-retinal membrane; PVD posterior vitreous detachment; RD retinal detachment; DM diabetes mellitus; DR diabetic retinopathy; NPDR non-proliferative diabetic retinopathy; PDR proliferative diabetic retinopathy; CSME clinically significant macular edema; DME diabetic macular edema; dbh dot blot hemorrhages; CWS cotton wool spot; POAG primary open angle glaucoma; C/D cup-to-disc ratio; HVF humphrey visual field; GVF goldmann visual field; OCT optical coherence tomography; IOP intraocular pressure; BRVO Branch retinal vein occlusion; CRVO central retinal vein occlusion; CRAO central retinal artery occlusion; BRAO branch retinal artery occlusion; RT retinal tear; SB scleral buckle; PPV pars plana vitrectomy; VH Vitreous hemorrhage; PRP panretinal laser photocoagulation; IVK intravitreal kenalog; VMT vitreomacular traction; MH Macular hole;  NVD neovascularization of the disc; NVE neovascularization elsewhere; AREDS age related eye disease study; ARMD age related macular degeneration; POAG primary open  angle glaucoma; EBMD epithelial/anterior basement membrane dystrophy; ACIOL anterior chamber intraocular lens; IOL intraocular lens; PCIOL posterior chamber intraocular lens; Phaco/IOL phacoemulsification with intraocular lens placement; [place] photorefractive keratectomy; LASIK laser assisted in situ keratomileusis; HTN hypertension; DM diabetes mellitus; COPD chronic obstructive pulmonary disease

## 2021-11-03 NOTE — Assessment & Plan Note (Signed)
The nature of wet macular degeneration was discussed with the patient.  Forms of therapy reviewed include the use of Anti-VEGF medications injected painlessly into the eye, as well as other possible treatment modalities, including thermal laser therapy. Fellow eye involvement and risks were discussed with the patient. Upon the finding of wet age related macular degeneration, treatment will be offered. The treatment regimen is on a treat as needed basis with the intent to treat if necessary and extend interval of exams when possible. On average 1 out of 6 patients do not need lifetime therapy. However, the risk of recurrent disease is high for a lifetime.  Initially monthly, then periodic, examinations and evaluations will determine whether the next treatment is required on the day of the examination.  OS, 1 week post most recent injection follow-up with

## 2021-11-03 NOTE — Assessment & Plan Note (Signed)
OD, overall vastly improved macular findings and less active CNVM today currently at interval of examination nearing 7 weeks.  Repeat injection today and examination next again in 7-week

## 2021-11-05 ENCOUNTER — Encounter (INDEPENDENT_AMBULATORY_CARE_PROVIDER_SITE_OTHER): Payer: Medicare Other | Admitting: Ophthalmology

## 2021-11-12 ENCOUNTER — Ambulatory Visit (INDEPENDENT_AMBULATORY_CARE_PROVIDER_SITE_OTHER): Payer: Medicare Other | Admitting: Orthopedic Surgery

## 2021-11-12 ENCOUNTER — Other Ambulatory Visit: Payer: Self-pay

## 2021-11-12 DIAGNOSIS — I872 Venous insufficiency (chronic) (peripheral): Secondary | ICD-10-CM

## 2021-11-12 DIAGNOSIS — Z89511 Acquired absence of right leg below knee: Secondary | ICD-10-CM

## 2021-11-14 ENCOUNTER — Encounter: Payer: Self-pay | Admitting: Orthopedic Surgery

## 2021-11-14 NOTE — Progress Notes (Signed)
Office Visit Note   Patient: Keith Espinoza           Date of Birth: Dec 26, 1940           MRN: [id number] Visit Date: 11/12/2021              Requested by: [name], MD [address] [place],  [place] [address] PCP: [name], MD  Chief Complaint  Patient presents with   Right Leg - Follow-up    Hx revision right BKA 01/09/2021   Left Leg - Follow-up    Venous insufficiency       HPI: Patient is an 81 year old gentleman with left lower extremity venous insufficiency he is currently in a crew sock.  He is status post right transtibial revision in April of last year.  Most recent ankle-brachial indices January 12.  Assessment & Plan: Visit Diagnoses:  1. Right below-knee amputee ([place])   2. Venous insufficiency (chronic) (peripheral)     Plan: Recommended a size medium knee-high 15-20 compression stocking.  Follow-Up Instructions: Return in about 4 weeks (around 12/10/2021).   Ortho Exam  Patient is alert, oriented, no adenopathy, well-dressed, normal affect, normal respiratory effort. Examination patient's ankle-brachial indices shows monophasic flow in the left with an ABI of 0.7 this is essentially unchanged from her previous ABI of 0.76.  There are no open venous ulcers no cellulitis no drainage at this time.  Imaging: No results found. No images are attached to the encounter.  Labs: Lab Results  Component Value Date   HGBA1C 8.3 (A) 10/06/2021   HGBA1C 7.5 (A) 06/30/2021   HGBA1C 7.5 (H) 02/01/2021   REPTSTATUS 04/18/2019 FINAL 04/13/2019   GRAMSTAIN  04/13/2019    RARE WBC PRESENT, PREDOMINANTLY PMN NO ORGANISMS SEEN    CULT  04/13/2019    No growth aerobically or anaerobically. Performed at [facility], [place] [facility] [address], [place], [place] [address]      Lab Results  Component Value Date   ALBUMIN 3.5 01/31/2021   ALBUMIN 2.9 (L) 12/17/2020   ALBUMIN 4.4 04/10/2019    Lab Results  Component Value Date   MG 2.7 (H)  02/01/2021   No results found for: VD25OH  No results found for: PREALBUMIN CBC EXTENDED Latest Ref Rng & Units 02/03/2021 02/01/2021 01/31/2021  WBC 4.0 - 10.5 K/uL 6.2 9.3 11.6(H)  RBC 4.22 - 5.81 MIL/uL 3.36(L) 3.59(L) 3.88(L)  HGB 13.0 - 17.0 g/dL 9.9(L) 10.6(L) 11.2(L)  HCT 39.0 - 52.0 % 28.9(L) 32.1(L) 35.9(L)  PLT 150 - 400 K/uL 152 171 187  NEUTROABS 1.7 - 7.7 K/uL - - -  LYMPHSABS 0.7 - 4.0 K/uL - - -     There is no height or weight on file to calculate BMI.  Orders:  No orders of the defined types were placed in this encounter.  No orders of the defined types were placed in this encounter.    Procedures: No procedures performed  Clinical Data: No additional findings.  ROS:  All other systems negative, except as noted in the HPI. Review of Systems  Objective: Vital Signs: There were no vitals taken for this visit.  Specialty Comments:  No specialty comments available.  PMFS History: Patient Active Problem List   Diagnosis Date Noted   History of right below knee amputation ([place]) 10/06/2021   Exudative age-related macular degeneration of left eye with active choroidal neovascularization ([place]) 07/28/2021   Exudative age-related macular degeneration of right eye with active  choroidal neovascularization ([place]) 06/01/2021   Intermediate stage nonexudative age-related macular degeneration of both eyes 06/01/2021   Type 2 diabetes mellitus with diabetic polyneuropathy, with long-term current use of insulin ([place]) 03/24/2021   Hypokalemia    Constipation 02/02/2021   Fecal impaction ([place]) 02/01/2021   Wound dehiscence 01/09/2021   Dehiscence of amputation stump ([place])    Status post percutaneous transluminal coronary angioplasty 01/06/2021   Type II diabetes mellitus, uncontrolled 01/06/2021   Acute pancreatitis 01/06/2021   Diabetic peripheral vascular disease ([place]) 01/06/2021   Encounter for screening for other disorder 01/06/2021   Enlarged prostate 01/06/2021    Foot ulcer, right ([place]) 01/06/2021   Gout 01/06/2021   Headache 01/06/2021   Neck pain 01/06/2021   Hypoglycemia 01/06/2021   Loss of appetite 01/06/2021   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   Peripheral neuropathy 01/06/2021   Sciatica 01/06/2021   Vitamin B12 deficiency 01/06/2021   Vitamin D deficiency 01/06/2021   Weakness 01/06/2021   Diabetes mellitus type 2 with neurological manifestations ([place]) 01/06/2021   Protein-calorie malnutrition, severe 12/26/2020   Acute blood loss anemia 12/26/2020   Prerenal azotemia 12/26/2020   Below-knee amputation of right lower extremity ([place]) 12/12/2020   Gangrene of right foot ([place])    Diabetic neuropathy ([place]) 11/17/2020   Hyperglycemia due to type 2 diabetes mellitus ([place]) 11/17/2020   Long term (current) use of insulin ([place]) 11/17/2020   Obesity 11/17/2020   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/2019   Hip dislocation, right ([place]) 04/13/2019   Other intervertebral disc degeneration, lumbar region 03/30/2019   CAD (coronary artery disease) 01/30/2019   Tobacco abuse 01/30/2019   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   Burn, foot, second degree, left, initial encounter 06/08/2017   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 03/16/2017   S/P total knee arthroplasty, left 10/26/2016   Hyperlipidemia 09/04/2014   Thrombocytopenia (HCC)    Precordial chest pain 04/05/2014   Coronary atherosclerosis of native coronary artery 10/01/2013   Other and unspecified hyperlipidemia 10/01/2013   Primary hypertension 10/01/2013   Diabetes mellitus ([place]) 10/01/2013   Esophageal reflux 10/01/2013   Hypertrophy of prostate without urinary obstruction and other lower urinary tract symptoms (LUTS) 10/01/2013   Past Medical History:  Diagnosis Date   Allergic rhinitis    Allergic rhinitis    Arthritis    Basal cell carcinoma 11/01/2019    bcc left chest treatment TX [id number]    Chronic leg pain    right   Chronic lower back  pain    Coronary artery disease    a. Stenting to RCA 2004; staged DES to LAD and Cx 2004. DES to mRCA 2012. b. DES to mCx, PTCA to dCx 11/2011. c. Lateral wall MI 2013 s/p PTCA to distal Cx & DES to mid OM2 11/2011. d. Low risk nuc 04/2014, EF wnl.   COVID-19    Diabetes mellitus    Insulin dependent   Diabetic neuropathy (HCC)    MILD   Diverticulosis    Dysrhythmia    [name] syndrome    Gout    right wrist; right foot; right elbow; have had it since 1970's   H/O hiatal hernia    Heart murmur    History of echocardiogram    aortic sclerosis per echo 12/09 EF 65%, otherwise normal   History of hemorrhoids    BLEEDING   History of kidney stones    h/o   Hypertension    Diagnosed 1995    Myocardial  infarction [facility])    Pancreatic pseudocyst    a. s/p remote drainage 2006.   Thrombocytopenia ([place])    Seen on oldest labs in system from 2004   Vitamin B 12 deficiency    orally replaced    Family History  Problem Relation Age of Onset   Diabetes Mother    Hyperlipidemia Mother    Hypertension Mother    Cancer Father    Hypertension Father    Diabetes Sister    Hypertension Sister    Cancer Brother    Heart attack Neg Hx     Past Surgical History:  Procedure Laterality Date   ABDOMINAL AORTOGRAM W/LOWER EXTREMITY Bilateral 08/08/2020   Procedure: ABDOMINAL AORTOGRAM W/LOWER EXTREMITY;  Surgeon: Angelia Mould, MD;  Location: [place] [facility];  Service: Cardiovascular;  Laterality: Bilateral;   AMPUTATION Right 12/12/2020   Procedure: RIGHT BELOW KNEE AMPUTATION;  Surgeon: [name], MD;  Location: [place];  Service: Orthopedics;  Laterality: Right;   BACK SURGERY     "total of 3 times" S/P fall    CARPAL TUNNEL RELEASE Bilateral    CHOLECYSTECTOMY  1990's   COLONOSCOPY     CORONARY ANGIOPLASTY  11/11/11   CORONARY ANGIOPLASTY WITH STENT PLACEMENT  09/30/2011   "1 then; makes a total of 4"   CORONARY ANGIOPLASTY WITH STENT PLACEMENT  11/11/11   "1; makes a total  of 5"   INGUINAL HERNIA REPAIR  2003   right   JOINT REPLACEMENT Right 04/03/2002   hip replacment   KNEE ARTHROSCOPY  1990's   left   LEFT HEART CATHETERIZATION WITH CORONARY ANGIOGRAM N/A 09/30/2011   Procedure: LEFT HEART CATHETERIZATION WITH CORONARY ANGIOGRAM;  Surgeon: Jettie Booze, MD;  Location: [facility] LAB;  Service: Cardiovascular;  Laterality: N/A;  possible PCI   LEFT HEART CATHETERIZATION WITH CORONARY ANGIOGRAM N/A 11/15/2011   Procedure: LEFT HEART CATHETERIZATION WITH CORONARY ANGIOGRAM;  Surgeon: Jettie Booze, MD;  Location: [facility] C[facility] LAB;  Service: Cardiovascular;  Laterality: N/A;   PERCUTANEOUS CORONARY STENT INTERVENTION (PCI-S)  09/30/2011   Procedure: PERCUTANEOUS CORONARY STENT INTERVENTION (PCI-S);  Surgeon: Jettie Booze, MD;  Location: [facility];  Service: Cardiovascular;;   PERCUTANEOUS CORONARY STENT INTERVENTION (PCI-S) N/A 11/11/2011   Procedure: PERCUTANEOUS CORONARY STENT INTERVENTION (PCI-S);  Surgeon: Jettie Booze, MD;  Location: [facility] CATH LAB;  Service: Cardiovascular;  Laterality: N/A;   PERIPHERAL VASCULAR BALLOON ANGIOPLASTY Right 08/08/2020   Procedure: PERIPHERAL VASCULAR BALLOON ANGIOPLASTY;  Surgeon: Angelia Mould, MD;  Location: [place] [facility];  Service: Cardiovascular;  Laterality: Right;  Posterior tibial    SHOULDER SURGERY Right    X 2   STUMP REVISION Right 01/09/2021   Procedure: REVISION RIGHT BELOW KNEE AMPUTATION;  Surgeon: [name], MD;  Location: [place];  Service: Orthopedics;  Laterality: Right;   TONSILLECTOMY  ~ [place] Right 04/13/2019   Procedure: RIGHT TOTAL HIP REVISION-POSTERIOR  APPROACH LATERAL;  Surgeon: Marybelle Killings, MD;  Location: [place];  Service: Orthopedics;  Laterality: Right;   TOTAL KNEE ARTHROPLASTY Left 07/23/2016   Procedure: LEFT TOTAL KNEE ARTHROPLASTY;  Surgeon: Marybelle Killings, MD;  Location: [place];  Service: Orthopedics;  Laterality: Left;   Social History    Occupational History   Occupation: Retired  Tobacco Use   Smoking status: Former   Smokeless tobacco: Current    Types: Chew   Tobacco comments:    quit 60 years  ago  Vaping Use   Vaping Use: Never used  Substance and Sexual Activity   Alcohol use: No   Drug use: No   Sexual activity: Not Currently

## 2021-11-30 DIAGNOSIS — Z20822 Contact with and (suspected) exposure to covid-19: Secondary | ICD-10-CM | POA: Diagnosis not present

## 2021-12-14 ENCOUNTER — Other Ambulatory Visit: Payer: Self-pay

## 2021-12-14 ENCOUNTER — Ambulatory Visit (INDEPENDENT_AMBULATORY_CARE_PROVIDER_SITE_OTHER): Payer: Medicare Other | Admitting: Ophthalmology

## 2021-12-14 ENCOUNTER — Encounter (INDEPENDENT_AMBULATORY_CARE_PROVIDER_SITE_OTHER): Payer: Self-pay | Admitting: Ophthalmology

## 2021-12-14 DIAGNOSIS — H353211 Exudative age-related macular degeneration, right eye, with active choroidal neovascularization: Secondary | ICD-10-CM

## 2021-12-14 DIAGNOSIS — H353221 Exudative age-related macular degeneration, left eye, with active choroidal neovascularization: Secondary | ICD-10-CM | POA: Diagnosis not present

## 2021-12-14 MED ORDER — BEVACIZUMAB 2.5 MG/0.1ML IZ SOSY
2.5000 mg | PREFILLED_SYRINGE | INTRAVITREAL | Status: AC | PRN
  Administered 2021-12-14: 2.5 mg via INTRAVITREAL

## 2021-12-14 NOTE — Progress Notes (Signed)
12/14/2021     CHIEF COMPLAINT Patient presents for  Chief Complaint  Patient presents with   Macular Degeneration      HISTORY OF PRESENT ILLNESS: Keith Espinoza is a 81 y.o. male who presents to the clinic today for:   HPI   7 weeks dilate OS, Avastin OS, OCT. Patient states vision is stable and unchanged since last visit. Denies any new floaters or FOL.   Last edited by Laurin Coder on 12/14/2021 10:19 AM.      Referring physician: [name], MD [address] [place],  [place] [address]  HISTORICAL INFORMATION:   Selected notes from the MEDICAL RECORD NUMBER    Lab Results  Component Value Date   HGBA1C 8.3 (A) 10/06/2021     CURRENT MEDICATIONS: No current outpatient medications on file. (Ophthalmic Drugs)   No current facility-administered medications for this visit. (Ophthalmic Drugs)   Current Outpatient Medications (Other)  Medication Sig   acetaminophen (TYLENOL) 500 MG tablet Take 1,000 mg by mouth every 6 (six) hours as needed (pain).   allopurinol (ZYLOPRIM) 300 MG tablet Take 300 mg by mouth daily.   amLODipine (NORVASC) 5 MG tablet Take 1 tablet (5 mg total) by mouth daily.   aspirin EC 81 MG tablet Take 81 mg by mouth daily.   Cholecalciferol (VITAMIN D3) 50 MCG (2000 UT) TABS Take 2,000 Units by mouth daily.    clopidogrel (PLAVIX) 75 MG tablet Take 1 tablet (75 mg total) by mouth daily.   Cyanocobalamin (B-12) 2500 MCG TABS Take 2,500 mcg by mouth daily.   empagliflozin (JARDIANCE) 25 MG TABS tablet Take 1 tablet (25 mg total) by mouth daily.   gabapentin (NEURONTIN) 100 MG capsule Take 2 capsules (200 mg total) by mouth at bedtime. (Patient taking differently: Take 600 mg by mouth at bedtime.)   gabapentin (NEURONTIN) 400 MG capsule Take 1 capsule (400 mg total) by mouth 3 (three) times daily. (Patient taking differently: Take 400 mg by mouth daily.)   hydrochlorothiazide (HYDRODIURIL) 12.5 MG tablet Take 1 tablet (12.5 mg  total) by mouth daily.   insulin degludec (TRESIBA FLEXTOUCH) 100 UNIT/ML FlexTouch Pen Inject 9 Units into the skin daily.   Insulin Pen Needle 32G X 4 MM MISC 1 Device by Does not apply route daily in the afternoon.   isosorbide mononitrate (IMDUR) 30 MG 24 hr tablet Take 1 tablet (30 mg total) by mouth daily. Please schedule yearly appointment for future refills. Thank you   losartan (COZAAR) 50 MG tablet Take 1 tablet (50 mg total) by mouth daily.   metFORMIN (GLUCOPHAGE) 1000 MG tablet Take 1 tablet (1,000 mg total) by mouth 2 (two) times daily.   nitroGLYCERIN (NITROSTAT) 0.4 MG SL tablet Place 0.4 mg under the tongue every 5 (five) minutes x 3 doses as needed for chest pain.    ONE TOUCH ULTRA TEST test strip CHECK BLOOD SUGAR ONCE DAILY AS DIRECTED   pioglitazone (ACTOS) 15 MG tablet Take 1 tablet (15 mg total) by mouth daily.   polyethylene glycol (MIRALAX) 17 g packet Take 17 g by mouth 2 (two) times daily.   potassium chloride SA (KLOR-CON) 20 MEQ tablet Take 1 tablet (20 mEq total) by mouth daily.   senna-docusate (SENOKOT-S) 8.6-50 MG tablet Take 1 tablet by mouth at bedtime.   terazosin (HYTRIN) 5 MG capsule Take 1 capsule (5 mg total) by mouth at bedtime.   traMADol (ULTRAM) 50 MG tablet Take 1 tablet (50  mg total) by mouth every 6 (six) hours as needed for moderate pain.   traZODone (DESYREL) 50 MG tablet Take 1 tablet (50 mg total) by mouth at bedtime as needed for sleep. (Patient taking differently: Take 50 mg by mouth 2 (two) times daily.)   No current facility-administered medications for this visit. (Other)      REVIEW OF SYSTEMS: ROS   Negative for: Constitutional, Gastrointestinal, Neurological, Skin, Genitourinary, Musculoskeletal, HENT, Endocrine, Cardiovascular, Eyes, Respiratory, Psychiatric, Allergic/Imm, Heme/Lymph Last edited by Hurman Horn, MD on 12/14/2021 10:39 AM.       ALLERGIES Allergies  Allergen Reactions   Simvastatin Other (See Comments)     SEVERE MYALGIAS    Zetia [Ezetimibe] Other (See Comments)    MYALGIAS   Dilaudid [Hydromorphone Hcl] Other (See Comments)    hallucination    PAST MEDICAL HISTORY Past Medical History:  Diagnosis Date   Allergic rhinitis    Allergic rhinitis    Arthritis    Basal cell carcinoma 11/01/2019    bcc left chest treatment [place] cx3 45f    Chronic leg pain    right   Chronic lower back pain    Coronary artery disease    a. Stenting to RCA 2004; staged DES to LAD and Cx 2004. DES to mRCA 2012. b. DES to mCx, PTCA to dCx 11/2011. c. Lateral wall MI 2013 s/p PTCA to distal Cx & DES to mid OM2 11/2011. d. Low risk nuc 04/2014, EF wnl.   COVID-19    Diabetes mellitus    Insulin dependent   Diabetic neuropathy ([place]    MILD   Diverticulosis    Dysrhythmia    GRosanna Randysyndrome    Gout    right wrist; right foot; right elbow; have had it since 1970's   H/O hiatal hernia    Heart murmur    History of echocardiogram    aortic sclerosis per echo 12/09 EF 65%, otherwise normal   History of hemorrhoids    BLEEDING   History of kidney stones    h/o   Hypertension    Diagnosed 1995    Myocardial infarction ([facility]    Pancreatic pseudocyst    a. s/p remote drainage 2006.   Thrombocytopenia ([place]    Seen on oldest labs in system from 2004   Vitamin B 12 deficiency    orally replaced   Past Surgical History:  Procedure Laterality Date   ABDOMINAL AORTOGRAM W/LOWER EXTREMITY Bilateral 08/08/2020   Procedure: ABDOMINAL AORTOGRAM W/LOWER EXTREMITY;  Surgeon: DAngelia Mould MD;  [facility];  Service: Cardiovascular;  Laterality: Bilateral;   AMPUTATION Right 12/12/2020   Procedure: RIGHT BELOW KNEE AMPUTATION;  Surgeon: DNewt Minion MD;  [place] [facility]  Service: Orthopedics;  Laterality: Right;   BACK SURGERY     "total of 3 times" S/P fall    CARPAL TUNNEL RELEASE Bilateral    CHOLECYSTECTOMY  1990's   COLONOSCOPY     CORONARY ANGIOPLASTY  11/11/11   CORONARY  ANGIOPLASTY WITH STENT PLACEMENT  09/30/2011   "1 then; makes a total of 4"   CORONARY ANGIOPLASTY WITH STENT PLACEMENT  11/11/11   "1; makes a total of 5"   INGUINAL HERNIA REPAIR  2003   right   JOINT REPLACEMENT Right 04/03/2002   hip replacment   KNEE ARTHROSCOPY  1990's   left   LEFT HEART CATHETERIZATION WITH CORONARY ANGIOGRAM N/A 09/30/2011   Procedure: LEFT HEART CATHETERIZATION WITH CORONARY ANGIOGRAM;  Surgeon: Jettie Booze, MD;  Location: [facility] C[facility] LAB;  Service: Cardiovascular;  Laterality: N/A;  possible PCI   LEFT HEART CATHETERIZATION WITH CORONARY ANGIOGRAM N/A 11/15/2011   Procedure: LEFT HEART CATHETERIZATION WITH CORONARY ANGIOGRAM;  Surgeon: Jettie Booze, MD;  Location[facility] CATH LAB;  Service: Cardiovascular;  Laterality: N/A;   PERCUTANEOUS CORONARY STENT INTERVENTION (PCI-S)  09/30/2011   Procedure: PERCUTANEOUS CORONARY STENT INTERVENTION (PCI-S);  Surgeon: Jettie Booze, MD;  Location: [facility] CATH LAB;  Service: Cardiovascular;;   PERCUTANEOUS CORONARY STENT INTERVENTION (PCI-S) N/A 11/11/2011   Procedure: PERCUTANEOUS CORONARY STENT INTERVENTION (PCI-S);  Surgeon: Jettie Booze, MD;  [facility] LAB;  Service: Cardiovascular;  Laterality: N/A;   PERIPHERAL VASCULAR BALLOON ANGIOPLASTY Right 08/08/2020   Procedure: PERIPHERAL VASCULAR BALLOON ANGIOPLASTY;  Surgeon: Angelia Mould, MD;  Location: [place] [facility];  Service: Cardiovascular;  Laterality: Right;  Posterior tibial    SHOULDER SURGERY Right    X 2   STUMP REVISION Right 01/09/2021   Procedure: REVISION RIGHT BELOW KNEE AMPUTATION;  Surgeon: [name], MD;  Location: [place];  Service: Orthopedics;  Laterality: Right;   TONSILLECTOMY  ~ [place] Right 04/13/2019   Procedure: RIGHT TOTAL HIP REVISION-POSTERIOR  APPROACH LATERAL;  Surgeon: Marybelle Killings, MD;  Location: [place];  Service: Orthopedics;  Laterality: Right;   TOTAL KNEE ARTHROPLASTY Left 07/23/2016    Procedure: LEFT TOTAL KNEE ARTHROPLASTY;  Surgeon: Marybelle Killings, MD;  Location: [place];  Service: Orthopedics;  Laterality: Left;    FAMILY HISTORY Family History  Problem Relation Age of Onset   Diabetes Mother    Hyperlipidemia Mother    Hypertension Mother    Cancer Father    Hypertension Father    Diabetes Sister    Hypertension Sister    Cancer Brother    Heart attack Neg Hx     SOCIAL HISTORY Social History   Tobacco Use   Smoking status: Former   Smokeless tobacco: Current    Types: Chew   Tobacco comments:    quit 60 years ago  Vaping Use   Vaping Use: Never used  Substance Use Topics   Alcohol use: No   Drug use: No         OPHTHALMIC EXAM:  Base Eye Exam     Visual Acuity (ETDRS)       Right Left   Dist cc 20/20 -1 20/30 -2    Correction: Glasses         Tonometry (Tonopen, 9:40 AM)       Right Left   Pressure 12 13         Pupils       Pupils Dark Light APD   Right PERRL 2.5 2 None   Left PERRL 2.5 2 None         Extraocular Movement       Right Left    Full Full         Neuro/Psych     Oriented x3: Yes   Mood/Affect: Normal         Dilation     Left eye: 1.0% Mydriacyl, 2.5% Phenylephrine @ 9:40 AM           Slit Lamp and Fundus Exam     External Exam       Right Left   External Normal Normal         Slit Lamp Exam  Right Left   Lids/Lashes Normal Normal   Conjunctiva/Sclera White and quiet White and quiet   Cornea Clear Clear   Anterior Chamber Deep and quiet Deep and quiet   Iris Round and reactive Round and reactive   Lens Centered posterior chamber intraocular lens Centered posterior chamber intraocular lens   Anterior Vitreous Normal Normal         Fundus Exam       Right Left   Posterior Vitreous  Normal   Disc  Normal   C/D Ratio  2.5   Macula  Retinal pigment epithelial mottling, Intermediate age related macular degeneration   Vessels  Normal, no DR   Periphery  Normal             IMAGING AND PROCEDURES  Imaging and Procedures for 12/14/21  Intravitreal Injection, Pharmacologic Agent - OS - Left Eye       Time Out 12/14/2021. 10:41 AM. Confirmed correct patient, procedure, site, and patient consented.   Anesthesia Topical anesthesia was used. Anesthetic medications included Lidocaine 4%.   Procedure Preparation included 5% betadine to ocular surface, 10% betadine to eyelids, Tobramycin 0.3%. A 30 gauge needle was used.   Injection: 2.5 mg bevacizumab 2.5 MG/0.1ML   Route: Intravitreal, Site: Left Eye   NDC: [phone], Lot: [id number]   Post-op Post injection exam found visual acuity of at least counting fingers. The patient tolerated the procedure well. There were no complications. The patient received written and verbal post procedure care education. Post injection medications included ocuflox.      OCT, Retina - OU - Both Eyes       Right Eye Quality was borderline. Scan locations included subfoveal. Central Foveal Thickness: 245. Progression has worsened. Findings include abnormal foveal contour, subretinal scarring, inner retinal atrophy, intraretinal hyper-reflective material, intraretinal fluid.   Left Eye Quality was borderline. Scan locations included subfoveal. Central Foveal Thickness: 234. Progression has been stable. Findings include abnormal foveal contour, retinal drusen , intraretinal hyper-reflective material, intraretinal fluid.   Notes OD much vastly improved OD post injection Avastin.  Currently at  weeks.  Repeat injection OD today  OS, macular findings also vastly improved currently at 1-week follow-up.  Repeat evaluation as scheduled             ASSESSMENT/PLAN:  Exudative age-related macular degeneration of right eye with active choroidal neovascularization ([place]) OD, currently at 6 weeks post follow-up, follow-up as scheduled  Exudative age-related macular degeneration of left eye with active choroidal  neovascularization ([place]) OS today at 7-week follow-up, will maintain 7-day week follow-up as condition has resolved post Avastin repeat injection today follow-up in 7 to 8-week     ICD-10-CM   1. Exudative age-related macular degeneration of left eye with active choroidal neovascularization (HCC)  H35.3221 Intravitreal Injection, Pharmacologic Agent - OS - Left Eye    OCT, Retina - OU - Both Eyes    bevacizumab (AVASTIN) SOSY 2.5 mg    2. Exudative age-related macular degeneration of right eye with active choroidal neovascularization ([place])  H35.3211       1.  OU with wet AMD, currently OD in 6 weeks follow-up as scheduled  2.  OS today at 7 weeks and 5 days, stable and improved overall, will repeat injection today Avastin follow-up next in 8 weeks  3.  Ophthalmic Meds Ordered this visit:  Meds ordered this encounter  Medications   bevacizumab (AVASTIN) SOSY 2.5 mg       Return in about 8 weeks (  around 02/08/2022) for dilate, AVASTIN OCT, OS,, and OD as scheduled.  There are no Patient Instructions on file for this visit.   Explained the diagnoses, plan, and follow up with the patient and they expressed understanding.  Patient expressed understanding of the importance of proper follow up care.   Clent Demark Bronwen Pendergraft M.D. Diseases & Surgery of the Retina and Vitreous [facility]ina [facility] Diabetic [place] 12/14/21     Abbreviations: M myopia (nearsighted); A astigmatism; H hyperopia (farsighted); P presbyopia; Mrx spectacle prescription;  CTL contact lenses; OD right eye; OS left eye; OU both eyes  XT exotropia; ET esotropia; PEK punctate epithelial keratitis; PEE punctate epithelial erosions; DES dry eye syndrome; MGD meibomian gland dysfunction; ATs artificial tears; PFAT's preservative free artificial tears; [place] nuclear sclerotic cataract; PSC posterior subcapsular cataract; ERM epi-retinal membrane; PVD posterior vitreous detachment; RD retinal detachment; DM diabetes mellitus; DR diabetic  retinopathy; NPDR non-proliferative diabetic retinopathy; PDR proliferative diabetic retinopathy; CSME clinically significant macular edema; DME diabetic macular edema; dbh dot blot hemorrhages; CWS cotton wool spot; POAG primary open angle glaucoma; C/D cup-to-disc ratio; HVF humphrey visual field; GVF goldmann visual field; OCT optical coherence tomography; IOP intraocular pressure; BRVO Branch retinal vein occlusion; CRVO central retinal vein occlusion; CRAO central retinal artery occlusion; BRAO branch retinal artery occlusion; RT retinal tear; SB scleral buckle; PPV pars plana vitrectomy; VH Vitreous hemorrhage; PRP panretinal laser photocoagulation; IVK intravitreal kenalog; VMT vitreomacular traction; MH Macular hole;  NVD neovascularization of the disc; NVE neovascularization elsewhere; AREDS age related eye disease study; ARMD age related macular degeneration; POAG primary open angle glaucoma; EBMD epithelial/anterior basement membrane dystrophy; ACIOL anterior chamber intraocular lens; IOL intraocular lens; PCIOL posterior chamber intraocular lens; Phaco/IOL phacoemulsification with intraocular lens placement; [place] photorefractive keratectomy; LASIK laser assisted in situ keratomileusis; HTN hypertension; DM diabetes mellitus; COPD chronic obstructive pulmonary disease

## 2021-12-14 NOTE — Assessment & Plan Note (Signed)
OD, currently at 6 weeks post follow-up, follow-up as scheduled ?

## 2021-12-14 NOTE — Assessment & Plan Note (Signed)
OS today at 7-week follow-up, will maintain 7-day week follow-up as condition has resolved post Avastin repeat injection today follow-up in 7 to 8-week ?

## 2021-12-16 ENCOUNTER — Encounter (INDEPENDENT_AMBULATORY_CARE_PROVIDER_SITE_OTHER): Payer: Medicare Other | Admitting: Ophthalmology

## 2021-12-22 ENCOUNTER — Encounter (INDEPENDENT_AMBULATORY_CARE_PROVIDER_SITE_OTHER): Payer: Medicare Other | Admitting: Ophthalmology

## 2021-12-23 ENCOUNTER — Encounter (INDEPENDENT_AMBULATORY_CARE_PROVIDER_SITE_OTHER): Payer: Self-pay | Admitting: Ophthalmology

## 2021-12-23 ENCOUNTER — Other Ambulatory Visit: Payer: Self-pay

## 2021-12-23 ENCOUNTER — Ambulatory Visit (INDEPENDENT_AMBULATORY_CARE_PROVIDER_SITE_OTHER): Payer: Medicare Other | Admitting: Ophthalmology

## 2021-12-23 DIAGNOSIS — H353211 Exudative age-related macular degeneration, right eye, with active choroidal neovascularization: Secondary | ICD-10-CM | POA: Diagnosis not present

## 2021-12-23 MED ORDER — BEVACIZUMAB 2.5 MG/0.1ML IZ SOSY
2.5000 mg | PREFILLED_SYRINGE | INTRAVITREAL | Status: AC | PRN
  Administered 2021-12-23: 2.5 mg via INTRAVITREAL

## 2021-12-23 NOTE — Assessment & Plan Note (Signed)
OD vastly improved and overall stable maintain at 7-week follow-up today post Avastin with stable acuity.  We will repeat injection today and extend interval follow-up next 8 weeks ?

## 2021-12-23 NOTE — Progress Notes (Signed)
? ? ?12/23/2021 ? ?  ? ?CHIEF COMPLAINT ?Patient presents for  ?Chief Complaint  ?Patient presents with  ? Macular Degeneration  ? ? ? ? ?HISTORY OF PRESENT ILLNESS: ?Keith Espinoza is a 81 y.o. male who presents to the clinic today for:  ? ?HPI   ?7 weeks for dilate OD, AVASTIN OD. ?Pt states, "after my left eye injection, I had trouble seeing. I looked at my watch and it looked like a dark cloud was over it." ?Pt denies floaters and FOL. ?Pt did see floaters after last injection but they went away. ? ? ?Last edited by Silvestre Moment on 12/23/2021 10:28 AM.  ?  ? ? ?Referring physician: ?[name], MD ?[address] ?[address] ?[place],  [place] [address] ? ?HISTORICAL INFORMATION:  ? ?Selected notes from the [facility] ?  ? ?Lab Results  ?Component Value Date  ? HGBA1C 8.3 (A) 10/06/2021  ?  ? ?CURRENT MEDICATIONS: ?No current outpatient medications on file. (Ophthalmic Drugs)  ? ?No current facility-administered medications for this visit. (Ophthalmic Drugs)  ? ?Current Outpatient Medications (Other)  ?Medication Sig  ? acetaminophen (TYLENOL) 500 MG tablet Take 1,000 mg by mouth every 6 (six) hours as needed (pain).  ? allopurinol (ZYLOPRIM) 300 MG tablet Take 300 mg by mouth daily.  ? amLODipine (NORVASC) 5 MG tablet Take 1 tablet (5 mg total) by mouth daily.  ? aspirin EC 81 MG tablet Take 81 mg by mouth daily.  ? Cholecalciferol (VITAMIN D3) 50 MCG (2000 UT) TABS Take 2,000 Units by mouth daily.   ? clopidogrel (PLAVIX) 75 MG tablet Take 1 tablet (75 mg total) by mouth daily.  ? Cyanocobalamin (B-12) 2500 MCG TABS Take 2,500 mcg by mouth daily.  ? empagliflozin (JARDIANCE) 25 MG TABS tablet Take 1 tablet (25 mg total) by mouth daily.  ? gabapentin (NEURONTIN) 100 MG capsule Take 2 capsules (200 mg total) by mouth at bedtime. (Patient taking differently: Take 600 mg by mouth at bedtime.)  ? gabapentin (NEURONTIN) 400 MG capsule Take 1 capsule (400 mg total) by mouth 3 (three) times daily. (Patient taking  differently: Take 400 mg by mouth daily.)  ? hydrochlorothiazide (HYDRODIURIL) 12.5 MG tablet Take 1 tablet (12.5 mg total) by mouth daily.  ? insulin degludec (TRESIBA FLEXTOUCH) 100 UNIT/ML FlexTouch Pen Inject 9 Units into the skin daily.  ? Insulin Pen Needle 32G X 4 MM MISC 1 Device by Does not apply route daily in the afternoon.  ? isosorbide mononitrate (IMDUR) 30 MG 24 hr tablet Take 1 tablet (30 mg total) by mouth daily. Please schedule yearly appointment for future refills. Thank you  ? losartan (COZAAR) 50 MG tablet Take 1 tablet (50 mg total) by mouth daily.  ? metFORMIN (GLUCOPHAGE) 1000 MG tablet Take 1 tablet (1,000 mg total) by mouth 2 (two) times daily.  ? nitroGLYCERIN (NITROSTAT) 0.4 MG SL tablet Place 0.4 mg under the tongue every 5 (five) minutes x 3 doses as needed for chest pain.   ? ONE TOUCH ULTRA TEST test strip CHECK BLOOD SUGAR ONCE DAILY AS DIRECTED  ? pioglitazone (ACTOS) 15 MG tablet Take 1 tablet (15 mg total) by mouth daily.  ? polyethylene glycol (MIRALAX) 17 g packet Take 17 g by mouth 2 (two) times daily.  ? potassium chloride SA (KLOR-CON) 20 MEQ tablet Take 1 tablet (20 mEq total) by mouth daily.  ? senna-docusate (SENOKOT-S) 8.6-50 MG tablet Take 1 tablet by mouth at bedtime.  ? terazosin (  HYTRIN) 5 MG capsule Take 1 capsule (5 mg total) by mouth at bedtime.  ? traMADol (ULTRAM) 50 MG tablet Take 1 tablet (50 mg total) by mouth every 6 (six) hours as needed for moderate pain.  ? traZODone (DESYREL) 50 MG tablet Take 1 tablet (50 mg total) by mouth at bedtime as needed for sleep. (Patient taking differently: Take 50 mg by mouth 2 (two) times daily.)  ? ?No current facility-administered medications for this visit. (Other)  ? ? ? ? ?REVIEW OF SYSTEMS: ?ROS   ?Negative for: Constitutional, Gastrointestinal, Neurological, Skin, Genitourinary, Musculoskeletal, HENT, Endocrine, Cardiovascular, Eyes, Respiratory, Psychiatric, Allergic/Imm, Heme/Lymph ?Last edited by Silvestre Moment on  12/23/2021 10:27 AM.  ?  ? ? ? ?ALLERGIES ?Allergies  ?Allergen Reactions  ? Simvastatin Other (See Comments)  ?  SEVERE MYALGIAS ?  ? Zetia [Ezetimibe] Other (See Comments)  ?  MYALGIAS  ? Dilaudid [Hydromorphone Hcl] Other (See Comments)  ?  hallucination  ? ? ?PAST MEDICAL HISTORY ?Past Medical History:  ?Diagnosis Date  ? Allergic rhinitis   ? Allergic rhinitis   ? Arthritis   ? Basal cell carcinoma 11/01/2019  ?  bcc left chest treatment [place] cx3 69f   ? Chronic leg pain   ? right  ? Chronic lower back pain   ? Coronary artery disease   ? a. Stenting to RCA 2004; staged DES to LAD and Cx 2004. DES to mRCA 2012. b. DES to mCx, PTCA to dCx 11/2011. c. Lateral wall MI 2013 s/p PTCA to distal Cx & DES to mid OM2 11/2011. d. Low risk nuc 04/2014, EF wnl.  ? COVID-19   ? Diabetes mellitus   ? Insulin dependent  ? Diabetic neuropathy ([facility]   ? MILD  ? Diverticulosis   ? Dysrhythmia   ? GRosanna Randysyndrome   ? Gout   ? right wrist; right foot; right elbow; have had it since 1970's  ? H/O hiatal hernia   ? Heart murmur   ? History of echocardiogram   ? aortic sclerosis per echo 12/09 EF 65%, otherwise normal  ? History of hemorrhoids   ? BLEEDING  ? History of kidney stones   ? h/o  ? Hypertension   ? Diagnosed 1995   ? [facility] ([facility]   ? Pancreatic pseudocyst   ? a. s/p remote drainage 2006.  ? Thrombocytopenia ([facility]   ? Seen on oldest labs in system from 2004  ? Vitamin B 12 deficiency   ? orally replaced  ? ?Past Surgical History:  ?Procedure Laterality Date  ? ABDOMINAL AORTOGRAM W/LOWER EXTREMITY Bilateral 08/08/2020  ? Procedure: ABDOMINAL AORTOGRAM W/LOWER EXTREMITY;  Surgeon: DAngelia Mould MD;  [facility];  Service: Cardiovascular;  Laterality: Bilateral;  ? AMPUTATION Right 12/12/2020  ? Procedure: RIGHT BELOW KNEE AMPUTATION;  Surgeon: DNewt Minion MD;  [place]: [facility]  Service: Orthopedics;  Laterality: Right;  ? BACK SURGERY    ? "total of 3 times" S/P fall   ? CARPAL TUNNEL  RELEASE Bilateral   ? CHOLECYSTECTOMY  1990's  ? COLONOSCOPY    ? CORONARY ANGIOPLASTY  11/11/11  ? CORONARY ANGIOPLASTY WITH STENT PLACEMENT  09/30/2011  ? "1 then; makes a total of 4"  ? CORONARY ANGIOPLASTY WITH STENT PLACEMENT  11/11/11  ? "1; makes a total of 5"  ? INGUINAL HERNIA REPAIR  2003  ? right  ? JOINT REPLACEMENT Right 04/03/2002  ? hip replacment  ? KNEE ARTHROSCOPY  1990's  ?  left  ? LEFT HEART CATHETERIZATION WITH CORONARY ANGIOGRAM N/A 09/30/2011  ? Procedure: LEFT HEART CATHETERIZATION WITH CORONARY ANGIOGRAM;  Surgeon: Jettie Booze, MD;  Location: [facility] CATH LAB;  Service: Cardiovascular;  Laterality: N/A;  possible PCI  ? LEFT HEART CATHETERIZATION WITH CORONARY ANGIOGRAM N/A 11/15/2011  ? Procedure: LEFT HEART CATHETERIZATION WITH CORONARY ANGIOGRAM;  Surgeon: Jettie Booze, MD;  Location: [facility] CATH LAB;  Service: Cardiovascular;  Laterality: N/A;  ? PERCUTANEOUS CORONARY STENT INTERVENTION (PCI-S)  09/30/2011  ? Procedure: PERCUTANEOUS CORONARY STENT INTERVENTION (PCI-S);  Surgeon: Jettie Booze, MD;  Location: [facility] CATH LAB;  Service: Cardiovascular;;  ? PERCUTANEOUS CORONARY STENT INTERVENTION (PCI-S) N/A 11/11/2011  ? Procedure: PERCUTANEOUS CORONARY STENT INTERVENTION (PCI-S);  Surgeon: Jettie Booze, MD;  Location: [facility] CATH LAB;  Service: Cardiovascular;  Laterality: N/A;  ? PERIPHERAL VASCULAR BALLOON ANGIOPLASTY Right 08/08/2020  ? Procedure: PERIPHERAL VASCULAR BALLOON ANGIOPLASTY;  Surgeon: Angelia Mould, MD;  Location: [place] CV LAB;  Service: Cardiovascular;  Laterality: Right;  Posterior tibial ?  ? SHOULDER SURGERY Right   ? X 2  ? STUMP REVISION Right 01/09/2021  ? Procedure: REVISION RIGHT BELOW KNEE AMPUTATION;  Surgeon: [name], MD;  Location: [place];  Service: Orthopedics;  Laterality: Right;  ? TONSILLECTOMY  ~ 1948  ? TOTAL HIP REVISION Right 04/13/2019  ? Procedure: RIGHT TOTAL HIP REVISION-POSTERIOR  APPROACH LATERAL;  Surgeon: Marybelle Killings, MD;   Location: [place];  Service: Orthopedics;  Laterality: Right;  ? TOTAL KNEE ARTHROPLASTY Left 07/23/2016  ? Procedure: LEFT TOTAL KNEE ARTHROPLASTY;  Surgeon: Marybelle Killings, MD;  Location: [place];  Service: Orthop

## 2021-12-25 DIAGNOSIS — Z20822 Contact with and (suspected) exposure to covid-19: Secondary | ICD-10-CM | POA: Diagnosis not present

## 2022-01-05 DIAGNOSIS — Z20822 Contact with and (suspected) exposure to covid-19: Secondary | ICD-10-CM | POA: Diagnosis not present

## 2022-01-18 DIAGNOSIS — Z20822 Contact with and (suspected) exposure to covid-19: Secondary | ICD-10-CM | POA: Diagnosis not present

## 2022-01-26 DIAGNOSIS — Z20822 Contact with and (suspected) exposure to covid-19: Secondary | ICD-10-CM | POA: Diagnosis not present

## 2022-01-27 ENCOUNTER — Other Ambulatory Visit: Payer: Self-pay | Admitting: Physical Medicine and Rehabilitation

## 2022-02-03 ENCOUNTER — Ambulatory Visit (INDEPENDENT_AMBULATORY_CARE_PROVIDER_SITE_OTHER): Payer: Medicare Other

## 2022-02-03 ENCOUNTER — Ambulatory Visit (INDEPENDENT_AMBULATORY_CARE_PROVIDER_SITE_OTHER): Payer: Medicare Other | Admitting: Orthopedic Surgery

## 2022-02-03 ENCOUNTER — Encounter: Payer: Self-pay | Admitting: Orthopedic Surgery

## 2022-02-03 ENCOUNTER — Other Ambulatory Visit: Payer: Self-pay

## 2022-02-03 DIAGNOSIS — I10 Essential (primary) hypertension: Secondary | ICD-10-CM | POA: Diagnosis not present

## 2022-02-03 DIAGNOSIS — Z0001 Encounter for general adult medical examination with abnormal findings: Secondary | ICD-10-CM | POA: Diagnosis not present

## 2022-02-03 DIAGNOSIS — M25512 Pain in left shoulder: Secondary | ICD-10-CM

## 2022-02-03 DIAGNOSIS — E1342 Other specified diabetes mellitus with diabetic polyneuropathy: Secondary | ICD-10-CM | POA: Diagnosis not present

## 2022-02-03 DIAGNOSIS — I25118 Atherosclerotic heart disease of native coronary artery with other forms of angina pectoris: Secondary | ICD-10-CM | POA: Diagnosis not present

## 2022-02-03 DIAGNOSIS — R29898 Other symptoms and signs involving the musculoskeletal system: Secondary | ICD-10-CM | POA: Diagnosis not present

## 2022-02-03 DIAGNOSIS — N400 Enlarged prostate without lower urinary tract symptoms: Secondary | ICD-10-CM | POA: Diagnosis not present

## 2022-02-03 DIAGNOSIS — I700 Atherosclerosis of aorta: Secondary | ICD-10-CM | POA: Diagnosis not present

## 2022-02-03 DIAGNOSIS — E1165 Type 2 diabetes mellitus with hyperglycemia: Secondary | ICD-10-CM | POA: Diagnosis not present

## 2022-02-03 DIAGNOSIS — K219 Gastro-esophageal reflux disease without esophagitis: Secondary | ICD-10-CM | POA: Diagnosis not present

## 2022-02-03 DIAGNOSIS — Z79899 Other long term (current) drug therapy: Secondary | ICD-10-CM | POA: Diagnosis not present

## 2022-02-03 DIAGNOSIS — E785 Hyperlipidemia, unspecified: Secondary | ICD-10-CM | POA: Diagnosis not present

## 2022-02-03 DIAGNOSIS — E559 Vitamin D deficiency, unspecified: Secondary | ICD-10-CM | POA: Diagnosis not present

## 2022-02-03 DIAGNOSIS — M109 Gout, unspecified: Secondary | ICD-10-CM | POA: Diagnosis not present

## 2022-02-03 DIAGNOSIS — E1151 Type 2 diabetes mellitus with diabetic peripheral angiopathy without gangrene: Secondary | ICD-10-CM | POA: Diagnosis not present

## 2022-02-03 NOTE — Progress Notes (Signed)
? ?Office Visit Note ?  ?Patient: Keith Espinoza           ?Date of Birth: 1940/11/27           ?MRN: [id number] ?Visit Date: 02/03/2022 ?Requested by: [name], MD ?[address] ?[address] ?[place],  [place] [address] ?PCP: [name], MD ? ?Subjective: ?Chief Complaint  ?Patient presents with  ?? Left Shoulder - Pain  ? ? ?HPI: Keith Espinoza is a 81 y.o. male who presents to the office complaining of left shoulder pain.  Patient states that he has had severe pain for the last 3 weeks after he fell flat on his back while trying to pick up a piece of metal that weighed about 70 pounds.  Localizes pain to the anterolateral aspect of the left shoulder without radiation.  Feels pain is worsening.  No radiation of pain.  No numbness or tingling.  He is right-hand dominant.  He has difficulty with his ADLs due to the decreased range of motion.  Pain does not wake him up at night but he cannot really sleep on his left side which is atypical for him.  He has never had surgery to the left shoulder but has had 2 prior rotator cuff repair surgeries on the right shoulder by Dr. Lorin Mercy.  Does have history of diabetes with last A1c 7.8.  Takes Plavix.Marland Kitchen   ?             ?ROS: All systems reviewed are negative as they relate to the chief complaint within the history of present illness.  Patient denies fevers or chills. ? ?Assessment & Plan: ?Visit Diagnoses:  ?1. Left shoulder pain, unspecified chronicity   ?2. Shoulder weakness   ? ? ?Plan: Patient is a 81 year old male who presents for evaluation of left shoulder pain following fall with lifting injury.  This happened about 3 weeks ago and he has had worsening pain just localized to the anterolateral aspect of the shoulder since then.  No history of prior surgery to the left shoulder.  Does have rotator cuff weakness of supraspinatus and subscapularis on exam today.  No fracture or dislocation noted on radiographs today.  Discussed the options available to patient and he  would like to proceed with MRI of the left shoulder for evaluation of subscapularis and supraspinatus rotator cuff tear.  Follow-up after MRI to review results. ? ?Follow-Up Instructions: No follow-ups on file.  ? ?Orders:  ?Orders Placed This Encounter  ?Procedures  ?? XR Shoulder Left  ?? MR Shoulder Left w/o contrast  ? ?No orders of the defined types were placed in this encounter. ? ? ? ? Procedures: ?No procedures performed ? ? ?Clinical Data: ?No additional findings. ? ?Objective: ?Vital Signs: There were no vitals taken for this visit. ? ?Physical Exam:  ?Constitutional: Patient appears well-developed ?HEENT:  ?Head: Normocephalic ?Eyes:EOM are normal ?Neck: Normal range of motion ?Cardiovascular: Normal rate ?Pulmonary/chest: Effort normal ?Neurologic: Patient is alert ?Skin: Skin is warm ?Psychiatric: Patient has normal mood and affect ? ?Ortho Exam: Ortho exam demonstrates right shoulder with 40 degrees external rotation, 80 degrees abduction, 150 degrees forward flexion.  This compared with the left shoulder with 60 degrees external rotation, 75 degrees abduction, 120 degrees forward flexion.  Mild crepitus noted with passive motion of the left shoulder.  Excellent strength rated 5/5 of grip strength, finger abduction, pronation/supination, bicep, tricep, deltoid.  Axillary nerve intact with deltoid firing.  5/5 interest Natus strength bilaterally.  4/5  supraspinatus and subscapularis strength of the left shoulder versus the right which was rated 5/5 with reproduction of pain. ? ?Specialty Comments:  ?No specialty comments available. ? ?Imaging: ?No results found. ? ? ?PMFS History: ?Patient Active Problem List  ? Diagnosis Date Noted  ?? History of right below knee amputation ([place]) 10/06/2021  ?? Exudative age-related macular degeneration of left eye with active choroidal neovascularization ([place]) 07/28/2021  ?? Exudative age-related macular degeneration of right eye with active choroidal  neovascularization ([place]) 06/01/2021  ?? Intermediate stage nonexudative age-related macular degeneration of both eyes 06/01/2021  ?? Type 2 diabetes mellitus with diabetic polyneuropathy, with long-term current use of insulin ([place]) 03/24/2021  ?? Hypokalemia   ?? Constipation 02/02/2021  ?? Fecal impaction ([place]) 02/01/2021  ?? Wound dehiscence 01/09/2021  ?? Dehiscence of amputation stump (HCC)   ?? Status post percutaneous transluminal coronary angioplasty 01/06/2021  ?? Type II diabetes mellitus, uncontrolled 01/06/2021  ?? Acute pancreatitis 01/06/2021  ?? Diabetic peripheral vascular disease ([place]) 01/06/2021  ?? Encounter for screening for other disorder 01/06/2021  ?? Enlarged prostate 01/06/2021  ?? Foot ulcer, right ([place]) 01/06/2021  ?? Gout 01/06/2021  ?? Headache 01/06/2021  ?? Neck pain 01/06/2021  ?? Hypoglycemia 01/06/2021  ?? Loss of appetite 01/06/2021  ?? 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 Peripheral neuropathy 01/06/2021  ?? Sciatica 01/06/2021  ?? Vitamin B12 deficiency 01/06/2021  ?? Vitamin D deficiency 01/06/2021  ?? Weakness 01/06/2021  ?? Diabetes mellitus type 2 with neurological manifestations ([place]) 01/06/2021  ?? Protein-calorie malnutrition, severe 12/26/2020  ?? Acute blood loss anemia 12/26/2020  ?? Prerenal azotemia 12/26/2020  ?? Below-knee amputation of right lower extremity ([place]) 12/12/2020  ?? Gangrene of right foot ([place])   ?? Diabetic neuropathy ([place]) 11/17/2020  ?? Hyperglycemia due to type 2 diabetes mellitus ([place]) 11/17/2020  ?? Long term (current) use of insulin ([place]) 11/17/2020  ?? Obesity 11/17/2020  ?? 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/2019  ?? Hip dislocation, right ([place]) 04/13/2019  ?? Other intervertebral disc degeneration, lumbar region 03/30/2019  ?? CAD (coronary artery disease) 01/30/2019  ?? Tobacco abuse 01/30/2019  ?? 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  ?? Burn, foot, second degree, left, initial encounter 06/08/2017  ?? 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 S/P total knee arthroplasty, left 10/26/2016  ?? Hyperlipidemia 09/04/2014  ?? Thrombocytopenia ([place])   ?? Precordial chest pain 04/05/2014  ?? Coronary atherosclerosis of native coronary artery 10/01/2013  ?? Other and unspecified hyperlipidemia 10/01/2013  ?? Primary hypertension 10/01/2013  ?? Diabetes mellitus ([place]) 10/01/2013  ?? Esophageal reflux 10/01/2013  ?? Hypertrophy of prostate without urinary obstruction and other lower urinary tract symptoms (LUTS) 10/01/2013  ? ?Past Medical History:  ?Diagnosis Date  ?? Allergic rhinitis   ?? Allergic rhinitis   ?? Arthritis   ?? Basal cell carcinoma 11/01/2019  ?  bcc left chest treatment [place] cx3 64f   ?? Chronic leg pain   ? right  ?? Chronic lower back pain   ?? Coronary artery disease   ? a. Stenting to RCA 2004; staged DES to LAD and Cx 2004. DES to mRCA 2012. b. DES to mCx, PTCA to dCx 11/2011. c. Lateral wall MI 2013 s/p PTCA to distal Cx & DES to mid OM2 11/2011. d. Low risk nuc 04/2014, EF wnl.  ?? COVID-19   ?? Diabetes mellitus   ? Insulin dependent  ?? Diabetic neuropathy ([facility]   ? MILD  ?? Diverticulosis   ?? Dysrhythmia   ??  [name] syndrome   ?? Gout   ? right wrist; right foot; right elbow; have had it since 1970's  ?? H/O hiatal hernia   ?? Heart murmur   ?? History of echocardiogram   ? aortic sclerosis per echo 12/09 EF 65%, otherwise normal  ?? History of hemorrhoids   ? BLEEDING  ?? History of kidney stones   ? h/o  ?? Hypertension   ? Diagnosed 1995   ?? Myocardial infarction [facility]   ?? Pancreatic pseudocyst   ? a. s/p remote drainage 2006.  ?? Thrombocytopenia ([place])   ? Seen on oldest labs in system from 2004  ?? Vitamin B 12 deficiency   ? orally replaced  ?  ?Family History  ?Problem Relation Age of Onset  ?? Diabetes Mother   ?? Hyperlipidemia Mother   ?? Hypertension Mother   ?? Cancer Father   ?? Hypertension Father   ?? Diabetes Sister   ?? Hypertension Sister   ?? Cancer Brother   ?? Heart attack Neg  Hx   ?  ?Past Surgical History:  ?Procedure Laterality Date  ?? ABDOMINAL AORTOGRAM W/LOWER EXTREMITY Bilateral 08/08/2020  ? Procedure: ABDOMINAL AORTOGRAM W/LOWER EXTREMITY;  Surgeon: Angelia Mould, MD;

## 2022-02-08 ENCOUNTER — Encounter (INDEPENDENT_AMBULATORY_CARE_PROVIDER_SITE_OTHER): Payer: Self-pay | Admitting: Ophthalmology

## 2022-02-08 ENCOUNTER — Ambulatory Visit (INDEPENDENT_AMBULATORY_CARE_PROVIDER_SITE_OTHER): Payer: Medicare Other | Admitting: Ophthalmology

## 2022-02-08 DIAGNOSIS — H353211 Exudative age-related macular degeneration, right eye, with active choroidal neovascularization: Secondary | ICD-10-CM

## 2022-02-08 DIAGNOSIS — H353221 Exudative age-related macular degeneration, left eye, with active choroidal neovascularization: Secondary | ICD-10-CM | POA: Diagnosis not present

## 2022-02-08 DIAGNOSIS — Z20822 Contact with and (suspected) exposure to covid-19: Secondary | ICD-10-CM | POA: Diagnosis not present

## 2022-02-08 MED ORDER — BEVACIZUMAB 2.5 MG/0.1ML IZ SOSY
2.5000 mg | PREFILLED_SYRINGE | INTRAVITREAL | Status: AC | PRN
  Administered 2022-02-08: 2.5 mg via INTRAVITREAL

## 2022-02-08 NOTE — Progress Notes (Signed)
? ? ?02/08/2022 ? ?  ? ?CHIEF COMPLAINT ?Patient presents for  ?Chief Complaint  ?Patient presents with  ? Macular Degeneration  ? ? ? ? ?HISTORY OF PRESENT ILLNESS: ?Keith Espinoza is a 81 y.o. male who presents to the clinic today for:  ? ?HPI   ?8 weeks dilate Avastin OS, OCT. ?Patient states vision is stable and unchanged since last visit. Denies any new floaters or FOL. ?Patient reports "I can't tell any improvement or worsening." ?Last edited by Laurin Coder on 02/08/2022  9:37 AM.  ?  ? ? ?Referring physician: ?[name], MD ?[address] ?[address] ?[place],  [place] [address] ? ?HISTORICAL INFORMATION:  ? ?Selected notes from the [facility] ?  ? ?Lab Results  ?Component Value Date  ? HGBA1C 8.3 (A) 10/06/2021  ?  ? ?CURRENT MEDICATIONS: ?No current outpatient medications on file. (Ophthalmic Drugs)  ? ?No current facility-administered medications for this visit. (Ophthalmic Drugs)  ? ?Current Outpatient Medications (Other)  ?Medication Sig  ? acetaminophen (TYLENOL) 500 MG tablet Take 1,000 mg by mouth every 6 (six) hours as needed (pain).  ? allopurinol (ZYLOPRIM) 300 MG tablet Take 300 mg by mouth daily.  ? amLODipine (NORVASC) 5 MG tablet Take 1 tablet (5 mg total) by mouth daily.  ? aspirin EC 81 MG tablet Take 81 mg by mouth daily.  ? Cholecalciferol (VITAMIN D3) 50 MCG (2000 UT) TABS Take 2,000 Units by mouth daily.   ? clopidogrel (PLAVIX) 75 MG tablet Take 1 tablet (75 mg total) by mouth daily.  ? Cyanocobalamin (B-12) 2500 MCG TABS Take 2,500 mcg by mouth daily.  ? empagliflozin (JARDIANCE) 25 MG TABS tablet Take 1 tablet (25 mg total) by mouth daily.  ? gabapentin (NEURONTIN) 100 MG capsule Take 2 capsules (200 mg total) by mouth at bedtime. (Patient taking differently: Take 600 mg by mouth at bedtime.)  ? gabapentin (NEURONTIN) 400 MG capsule Take 1 capsule (400 mg total) by mouth 3 (three) times daily. (Patient taking differently: Take 400 mg by mouth daily.)  ? hydrochlorothiazide  (HYDRODIURIL) 12.5 MG tablet Take 1 tablet (12.5 mg total) by mouth daily.  ? insulin degludec (TRESIBA FLEXTOUCH) 100 UNIT/ML FlexTouch Pen Inject 9 Units into the skin daily.  ? Insulin Pen Needle 32G X 4 MM MISC 1 Device by Does not apply route daily in the afternoon.  ? isosorbide mononitrate (IMDUR) 30 MG 24 hr tablet Take 1 tablet (30 mg total) by mouth daily. Please schedule yearly appointment for future refills. Thank you  ? losartan (COZAAR) 50 MG tablet Take 1 tablet (50 mg total) by mouth daily.  ? metFORMIN (GLUCOPHAGE) 1000 MG tablet Take 1 tablet (1,000 mg total) by mouth 2 (two) times daily.  ? nitroGLYCERIN (NITROSTAT) 0.4 MG SL tablet Place 0.4 mg under the tongue every 5 (five) minutes x 3 doses as needed for chest pain.   ? ONE TOUCH ULTRA TEST test strip CHECK BLOOD SUGAR ONCE DAILY AS DIRECTED  ? pioglitazone (ACTOS) 15 MG tablet Take 1 tablet (15 mg total) by mouth daily.  ? polyethylene glycol (MIRALAX) 17 g packet Take 17 g by mouth 2 (two) times daily.  ? potassium chloride SA (KLOR-CON) 20 MEQ tablet Take 1 tablet (20 mEq total) by mouth daily.  ? senna-docusate (SENOKOT-S) 8.6-50 MG tablet Take 1 tablet by mouth at bedtime.  ? terazosin (HYTRIN) 5 MG capsule Take 1 capsule (5 mg total) by mouth at bedtime.  ? traMADol Veatrice Bourbon)  50 MG tablet Take 1 tablet (50 mg total) by mouth every 6 (six) hours as needed for moderate pain.  ? traZODone (DESYREL) 50 MG tablet Take 1 tablet (50 mg total) by mouth at bedtime as needed for sleep. (Patient taking differently: Take 50 mg by mouth 2 (two) times daily.)  ? ?No current facility-administered medications for this visit. (Other)  ? ? ? ? ?REVIEW OF SYSTEMS: ?ROS   ?Negative for: Constitutional, Gastrointestinal, Neurological, Skin, Genitourinary, Musculoskeletal, HENT, Endocrine, Cardiovascular, Eyes, Respiratory, Psychiatric, Allergic/Imm, Heme/Lymph ?Last edited by Hurman Horn, MD on 02/08/2022 10:21 AM.  ?  ? ? ? ?ALLERGIES ?Allergies  ?Allergen  Reactions  ? Simvastatin Other (See Comments)  ?  SEVERE MYALGIAS ?  ? Zetia [Ezetimibe] Other (See Comments)  ?  MYALGIAS  ? Dilaudid [Hydromorphone Hcl] Other (See Comments)  ?  hallucination  ? ? ?PAST MEDICAL HISTORY ?Past Medical History:  ?Diagnosis Date  ? Allergic rhinitis   ? Allergic rhinitis   ? Arthritis   ? Basal cell carcinoma 11/01/2019  ?  bcc left chest treatment [place] cx3 60f   ? Chronic leg pain   ? right  ? Chronic lower back pain   ? Coronary artery disease   ? a. Stenting to RCA 2004; staged DES to LAD and Cx 2004. DES to mRCA 2012. b. DES to mCx, PTCA to dCx 11/2011. c. Lateral wall MI 2013 s/p PTCA to distal Cx & DES to mid OM2 11/2011. d. Low risk nuc 04/2014, EF wnl.  ? COVID-19   ? Diabetes mellitus   ? Insulin dependent  ? Diabetic neuropathy ([place]   ? MILD  ? Diverticulosis   ? Dysrhythmia   ? GRosanna Randysyndrome   ? Gout   ? right wrist; right foot; right elbow; have had it since 1970's  ? H/O hiatal hernia   ? Heart murmur   ? History of echocardiogram   ? aortic sclerosis per echo 12/09 EF 65%, otherwise normal  ? History of hemorrhoids   ? BLEEDING  ? History of kidney stones   ? h/o  ? Hypertension   ? Diagnosed 1995   ? Myocardial infarction ([facility]   ? Pancreatic pseudocyst   ? a. s/p remote drainage 2006.  ? Thrombocytopenia ([facility]   ? Seen on oldest labs in system from 2004  ? Vitamin B 12 deficiency   ? orally replaced  ? ?Past Surgical History:  ?Procedure Laterality Date  ? ABDOMINAL AORTOGRAM W/LOWER EXTREMITY Bilateral 08/08/2020  ? Procedure: ABDOMINAL AORTOGRAM W/LOWER EXTREMITY;  Surgeon: DAngelia Mould MD;  [facility];  Service: Cardiovascular;  Laterality: Bilateral;  ? AMPUTATION Right 12/12/2020  ? Procedure: RIGHT BELOW KNEE AMPUTATION;  Surgeon: DNewt Minion MD;  [facility]  Service: Orthopedics;  Laterality: Right;  ? BACK SURGERY    ? "total of 3 times" S/P fall   ? CARPAL TUNNEL RELEASE Bilateral   ? CHOLECYSTECTOMY  1990's  ? COLONOSCOPY     ? CORONARY ANGIOPLASTY  11/11/11  ? CORONARY ANGIOPLASTY WITH STENT PLACEMENT  09/30/2011  ? "1 then; makes a total of 4"  ? CORONARY ANGIOPLASTY WITH STENT PLACEMENT  11/11/11  ? "1; makes a total of 5"  ? INGUINAL HERNIA REPAIR  2003  ? right  ? JOINT REPLACEMENT Right 04/03/2002  ? hip replacment  ? KNEE ARTHROSCOPY  1990's  ? left  ? LEFT HEART CATHETERIZATION WITH CORONARY ANGIOGRAM N/A 09/30/2011  ? Procedure:  LEFT HEART CATHETERIZATION WITH CORONARY ANGIOGRAM;  Surgeon: Jettie Booze, MD;  Location[facility] CATH LAB;  Service: Cardiovascular;  Laterality: N/A;  possible PCI  ? LEFT HEART CATHETERIZATION WITH CORONARY ANGIOGRAM N/A 11/15/2011  ? Procedure: LEFT HEART CATHETERIZATION WITH CORONARY ANGIOGRAM;  Surgeon: Jettie Booze, MD;  Location[facility] CATH LAB;  Service: Cardiovascular;  Laterality: N/A;  ? PERCUTANEOUS CORONARY STENT INTERVENTION (PCI-S)  09/30/2011  ? Procedure: PERCUTANEOUS CORONARY STENT INTERVENTION (PCI-S);  Surgeon: Jettie Booze, MD;  Location: [facility] CATH LAB;  Service: Cardiovascular;;  ? PERCUTANEOUS CORONARY STENT INTERVENTION (PCI-S) N/A 11/11/2011  ? Procedure: PERCUTANEOUS CORONARY STENT INTERVENTION (PCI-S);  Surgeon: Jettie Booze, MD;  Location: [facility] CATH LAB;  Service: Cardiovascular;  Laterality: N/A;  ? PERIPHERAL VASCULAR BALLOON ANGIOPLASTY Right 08/08/2020  ? Procedure: PERIPHERAL VASCULAR BALLOON ANGIOPLASTY;  Surgeon: Angelia Mould, MD;  Location: [place] CV LAB;  Service: Cardiovascular;  Laterality: Right;  Posterior tibial ?  ? SHOULDER SURGERY Right   ? X 2  ? STUMP REVISION Right 01/09/2021  ? Procedure: REVISION RIGHT BELOW KNEE AMPUTATION;  Surgeon: [name], MD;  Location: [facility];  Service: Orthopedics;  Laterality: Right;  ? TONSILLECTOMY  ~ 1948  ? TOTAL HIP REVISION Right 04/13/2019  ? Procedure: RIGHT TOTAL HIP REVISION-POSTERIOR  APPROACH LATERAL;  Surgeon: Marybelle Killings, MD;  Location: [facility] [place];  Service: Orthopedics;  Laterality: Right;  ?  TOTAL KNEE ARTHROPLASTY Left 07/23/2016  ? Procedure: LEFT TOTAL KNEE ARTHROPLASTY;  Surgeon: Marybelle Killings, MD;  Location: [place];  Service: Orthopedics;  Laterality: Left;  ? ? ?FAMILY HISTORY ?[place]

## 2022-02-08 NOTE — Assessment & Plan Note (Signed)
Today improved overall as compared to October, less subretinal fluid maintained at 8-week follow-up interval.  Repeat injection today and follow-up again in 8 weeks so as to maintain good results ?

## 2022-02-08 NOTE — Assessment & Plan Note (Signed)
OD, currently at 7-week follow-up interval follow-up next as scheduled ?

## 2022-02-12 ENCOUNTER — Ambulatory Visit (INDEPENDENT_AMBULATORY_CARE_PROVIDER_SITE_OTHER): Payer: Medicare Other | Admitting: Internal Medicine

## 2022-02-12 ENCOUNTER — Encounter: Payer: Self-pay | Admitting: Internal Medicine

## 2022-02-12 VITALS — BP 136/80 | HR 95 | Ht 70.0 in | Wt 151.0 lb

## 2022-02-12 DIAGNOSIS — D3502 Benign neoplasm of left adrenal gland: Secondary | ICD-10-CM

## 2022-02-12 DIAGNOSIS — Z794 Long term (current) use of insulin: Secondary | ICD-10-CM | POA: Diagnosis not present

## 2022-02-12 DIAGNOSIS — E1159 Type 2 diabetes mellitus with other circulatory complications: Secondary | ICD-10-CM

## 2022-02-12 DIAGNOSIS — E1142 Type 2 diabetes mellitus with diabetic polyneuropathy: Secondary | ICD-10-CM | POA: Diagnosis not present

## 2022-02-12 DIAGNOSIS — R739 Hyperglycemia, unspecified: Secondary | ICD-10-CM

## 2022-02-12 DIAGNOSIS — E1165 Type 2 diabetes mellitus with hyperglycemia: Secondary | ICD-10-CM

## 2022-02-12 LAB — POCT GLUCOSE (DEVICE FOR HOME USE): Glucose Fasting, POC: 316 mg/dL — AB (ref 70–99)

## 2022-02-12 MED ORDER — EMPAGLIFLOZIN 25 MG PO TABS: 25.0000 mg | ORAL_TABLET | Freq: Every day | ORAL | 3 refills | Status: DC

## 2022-02-12 MED ORDER — TRESIBA FLEXTOUCH 100 UNIT/ML ~~LOC~~ SOPN
10.0000 [IU] | PEN_INJECTOR | Freq: Every day | SUBCUTANEOUS | 3 refills | Status: DC
Start: 2022-02-12 — End: 2022-06-23

## 2022-02-12 MED ORDER — METFORMIN HCL 1000 MG PO TABS: 1000.0000 mg | ORAL_TABLET | Freq: Two times a day (BID) | ORAL | 3 refills | Status: DC

## 2022-02-12 MED ORDER — PIOGLITAZONE HCL 30 MG PO TABS: 30.0000 mg | ORAL_TABLET | Freq: Every day | ORAL | 3 refills | Status: DC

## 2022-02-12 NOTE — Progress Notes (Signed)
?Name: Keith Espinoza  ?Age/ Sex: 81 y.o., male   ?MRN/ DOB: [id number], [birth year]    ? ?PCP: [name], MD   ?Reason for Endocrinology Evaluation: Type 2 Diabetes Mellitus  ?Initial Endocrine Consultative Visit: 03/24/2021  ? ? ?PATIENT IDENTIFIER: [name] is a 81 y.o. male with a past medical history of Right BK amputations due  PVD, T2DM and Pancreatitis .Marland Kitchen The patient has followed with Endocrinology clinic since 03/24/2021 for consultative assistance with management of his diabetes. ? ?DIABETIC HISTORY:  ?[name] was diagnosed with DM 7829, Trulicity caused weight loss, Has been on Glimepiride in the past. His hemoglobin A1c has ranged from 6.6% in 2020, peaking at 7.7% in 2022. ? ? ? ? ?Transitioned care from Dr. Nehemiah Settle in 2022 ? ? ? ?On his initial visit to our clinic he had an A1c 7.5 % He was on jardiance, Metformin and Basal insulin, which I have reduced basal insulin  ? ?Started pioglitazoe 10/2021 ? ? ? ? ?ADRENAL HISTORY : ?Pt found to have a 1 cm left adrenal nodule on CT imaging of abdomen on 01/31/2021. Of note, the pt was noted to have hypokalemia , this is chronic and has been on KCL for ~ 5 yrs  ?  ? ? ?Aldo, renin, 24-hr urinary cortisol and catecholamine have all come back normal 03/2021 ? ? ?SUBJECTIVE:  ? ?During the last visit (06/30/2021): A1c 8.3% Continue  Tresiba  , continued metformin and Jardiance  ? ? ? ? ? ?Today (02/12/2022): [name] is here for a follow up on diabetes management.  He checks his blood sugars 1 times daily. The patient has not had hypoglycemic episodes since the last clinic visit. ? ? ?Denies nausea, vomiting or diarrhea  ?Denies palpitations  ?Had a fasting 176 mg/dL  ? ? ?HOME DIABETES REGIMEN:  ?Tyler Aas / Levemir  10 units daily  ?Metformin 1000 mg Twice a day  ?Jardiance 25 mg daily ?Pioglitazone 15 mg daily  ? ? ? ? ?Statin: Intolerant to Simvastatin due to severe myalgia  ?ACE-I/ARB: yes ? ? ? ? ? ?METER DOWNLOAD SUMMARY: Did not bring   ? ? ? ?DIABETIC COMPLICATIONS: ?Microvascular complications:  ?Neuropathy, ?DR ?Denies: CKD ?Last Eye Exam: Completed 03/2021 ? ?Macrovascular complications:  ?CAD, PVD ?Denies: CVA ? ? ?HISTORY:  ?Past Medical History:  ?Past Medical History:  ?Diagnosis Date  ? Allergic rhinitis   ? Allergic rhinitis   ? Arthritis   ? Basal cell carcinoma 11/01/2019  ?  bcc left chest treatment [place] cx3 67f   ? Chronic leg pain   ? right  ? Chronic lower back pain   ? Coronary artery disease   ? a. Stenting to RCA 2004; staged DES to LAD and Cx 2004. DES to mRCA 2012. b. DES to mCx, PTCA to dCx 11/2011. c. Lateral wall MI 2013 s/p PTCA to distal Cx & DES to mid OM2 11/2011. d. Low risk nuc 04/2014, EF wnl.  ? COVID-19   ? Diabetes mellitus   ? Insulin dependent  ? [place]   ? MILD  ? Diverticulosis   ? Dysrhythmia   ? GRosanna Randysyndrome   ? Gout   ? right wrist; right foot; right elbow; have had it since 1970's  ? H/O hiatal hernia   ? Heart murmur   ? History of echocardiogram   ? aortic sclerosis per echo 12/09 EF 65%, otherwise normal  ? History of hemorrhoids   ? BLEEDING  ?  History of kidney stones   ? h/o  ? Hypertension   ? Diagnosed 1995   ? Myocardial infarction [facility])   ? Pancreatic pseudocyst   ? a. s/p remote drainage 2006.  ? Thrombocytopenia ([place])   ? Seen on oldest labs in system from 2004  ? Vitamin B 12 deficiency   ? orally replaced  ? ?Past Surgical History:  ?Past Surgical History:  ?Procedure Laterality Date  ? ABDOMINAL AORTOGRAM W/LOWER EXTREMITY Bilateral 08/08/2020  ? Procedure: ABDOMINAL AORTOGRAM W/LOWER EXTREMITY;  Surgeon: Angelia Mould, MD;  Location: [place] [facility];  Service: Cardiovascular;  Laterality: Bilateral;  ? AMPUTATION Right 12/12/2020  ? Procedure: RIGHT BELOW KNEE AMPUTATION;  Surgeon: [name], MD;  Location: [place];  Service: Orthopedics;  Laterality: Right;  ? BACK SURGERY    ? "total of 3 times" S/P fall   ? CARPAL TUNNEL RELEASE Bilateral   ? CHOLECYSTECTOMY   1990's  ? COLONOSCOPY    ? CORONARY ANGIOPLASTY  11/11/11  ? CORONARY ANGIOPLASTY WITH STENT PLACEMENT  09/30/2011  ? "1 then; makes a total of 4"  ? CORONARY ANGIOPLASTY WITH STENT PLACEMENT  11/11/11  ? "1; makes a total of 5"  ? INGUINAL HERNIA REPAIR  2003  ? right  ? JOINT REPLACEMENT Right 04/03/2002  ? hip replacment  ? KNEE ARTHROSCOPY  1990's  ? left  ? LEFT HEART CATHETERIZATION WITH CORONARY ANGIOGRAM N/A 09/30/2011  ? Procedure: LEFT HEART CATHETERIZATION WITH CORONARY ANGIOGRAM;  Surgeon: Jettie Booze, MD;  Location: [facility] CATH LAB;  Service: Cardiovascular;  Laterality: N/A;  possible PCI  ? LEFT HEART CATHETERIZATION WITH CORONARY ANGIOGRAM N/A 11/15/2011  ? Procedure: LEFT HEART CATHETERIZATION WITH CORONARY ANGIOGRAM;  Surgeon: Jettie Booze, MD;  Location: [facility] CATH LAB;  Service: Cardiovascular;  Laterality: N/A;  ? PERCUTANEOUS CORONARY STENT INTERVENTION (PCI-S)  09/30/2011  ? Procedure: PERCUTANEOUS CORONARY STENT INTERVENTION (PCI-S);  Surgeon: Jettie Booze, MD;  Location: [facility] CATH LAB;  Service: Cardiovascular;;  ? PERCUTANEOUS CORONARY STENT INTERVENTION (PCI-S) N/A 11/11/2011  ? Procedure: PERCUTANEOUS CORONARY STENT INTERVENTION (PCI-S);  Surgeon: Jettie Booze, MD;  Location: [facility] CATH LAB;  Service: Cardiovascular;  Laterality: N/A;  ? PERIPHERAL VASCULAR BALLOON ANGIOPLASTY Right 08/08/2020  ? Procedure: PERIPHERAL VASCULAR BALLOON ANGIOPLASTY;  Surgeon: Angelia Mould, MD;  Location: [place] CV [facility];  Service: Cardiovascular;  Laterality: Right;  Posterior tibial ?  ? SHOULDER SURGERY Right   ? X 2  ? STUMP REVISION Right 01/09/2021  ? Procedure: REVISION RIGHT BELOW KNEE AMPUTATION;  Surgeon: [name], MD;  Location: [place];  Service: Orthopedics;  Laterality: Right;  ? TONSILLECTOMY  ~ 1948  ? TOTAL HIP REVISION Right 04/13/2019  ? Procedure: RIGHT TOTAL HIP REVISION-POSTERIOR  APPROACH LATERAL;  Surgeon: Marybelle Killings, MD;  Location: [place];  Service:  Orthopedics;  Laterality: Right;  ? TOTAL KNEE ARTHROPLASTY Left 07/23/2016  ? Procedure: LEFT TOTAL KNEE ARTHROPLASTY;  Surgeon: Marybelle Killings, MD;  Location: [place];  Service: Orthopedics;  Laterality: Left;  ? ?Social History:  reports that he has quit smoking. His smokeless tobacco use includes chew. He reports that he does not drink alcohol and does not use drugs. ?Family History:  ?Family History  ?Problem Relation Age of Onset  ? Diabetes Mother   ? Hyperlipidemia Mother   ? Hypertension Mother   ? Cancer Father   ? Hypertension Father   ? Diabetes Sister   ? Hypertension Sister   ?  Cancer Brother   ? Heart attack Neg Hx   ? ? ? ?HOME MEDICATIONS: ?Allergies as of 02/12/2022   ? ?   Reactions  ? Simvastatin Other (See Comments)  ? SEVERE MYALGIAS  ? Zetia [ezetimibe] Other (See Comments)  ? MYALGIAS  ? Dilaudid [hydromorphone Hcl] Other (See Comments)  ? hallucination  ? ?  ? ?  ?Medication List  ?  ? ?  ? Accurate as of Feb 12, 2022  7:39 AM. If you have any questions, ask your nurse or doctor.  ?  ?  ? ?  ? ?acetaminophen 500 MG tablet ?Commonly known as: TYLENOL ?Take 1,000 mg by mouth every 6 (six) hours as needed (pain). ?  ?allopurinol 300 MG tablet ?Commonly known as: ZYLOPRIM ?Take 300 mg by mouth daily. ?  ?amLODipine 5 MG tablet ?Commonly known as: NORVASC ?Take 1 tablet (5 mg total) by mouth daily. ?  ?aspirin EC 81 MG tablet ?Take 81 mg by mouth daily. ?  ?B-12 2500 MCG Tabs ?Take 2,500 mcg by mouth daily. ?  ?clopidogrel 75 MG tablet ?Commonly known as: PLAVIX ?Take 1 tablet (75 mg total) by mouth daily. ?  ?empagliflozin 25 MG Tabs tablet ?Commonly known as: JARDIANCE ?Take 1 tablet (25 mg total) by mouth daily. ?  ?gabapentin 400 MG capsule ?Commonly known as: NEURONTIN ?Take 1 capsule (400 mg total) by mouth 3 (three) times daily. ?What changed: when to take this ?  ?gabapentin 100 MG capsule ?Commonly known as: NEURONTIN ?Take 2 capsules (200 mg total) by mouth at bedtime. ?What changed: how  much to take ?  ?hydrochlorothiazide 12.5 MG tablet ?Commonly known as: HYDRODIURIL ?Take 1 tablet (12.5 mg total) by mouth daily. ?  ?Insulin Pen Needle 32G X 4 MM Misc ?1 Device by Does not apply route daily in the afte

## 2022-02-12 NOTE — Patient Instructions (Addendum)
-   Tresiba 10 units daily  ?- Continue Metformin 1000 mg Twice a day  ?- Continue Jardiance 25 mg daily ?- Increase Actos 30 mg , 1 tablet daily  ? ? ? ? ?HOW TO TREAT LOW BLOOD SUGARS (Blood sugar LESS THAN 70 MG/DL) ?Please follow the RULE OF 15 for the treatment of hypoglycemia treatment (when your (blood sugars are less than 70 mg/dL)  ? ?STEP 1: Take 15 grams of carbohydrates when your blood sugar is low, which includes:  ?3-4 GLUCOSE TABS  OR ?3-4 OZ OF JUICE OR REGULAR SODA OR ?ONE TUBE OF GLUCOSE GEL   ? ?STEP 2: RECHECK blood sugar in 15 MINUTES ?STEP 3: If your blood sugar is still low at the 15 minute recheck --> then, go back to STEP 1 and treat AGAIN with another 15 grams of carbohydrates. ? ? ? ?

## 2022-02-15 DIAGNOSIS — D3502 Benign neoplasm of left adrenal gland: Secondary | ICD-10-CM | POA: Insufficient documentation

## 2022-02-15 DIAGNOSIS — E1165 Type 2 diabetes mellitus with hyperglycemia: Secondary | ICD-10-CM | POA: Insufficient documentation

## 2022-02-15 DIAGNOSIS — Z794 Long term (current) use of insulin: Secondary | ICD-10-CM | POA: Insufficient documentation

## 2022-02-17 ENCOUNTER — Encounter (INDEPENDENT_AMBULATORY_CARE_PROVIDER_SITE_OTHER): Payer: Self-pay | Admitting: Ophthalmology

## 2022-02-17 ENCOUNTER — Ambulatory Visit (INDEPENDENT_AMBULATORY_CARE_PROVIDER_SITE_OTHER): Payer: Medicare Other | Admitting: Ophthalmology

## 2022-02-17 DIAGNOSIS — H353211 Exudative age-related macular degeneration, right eye, with active choroidal neovascularization: Secondary | ICD-10-CM | POA: Diagnosis not present

## 2022-02-17 DIAGNOSIS — H353221 Exudative age-related macular degeneration, left eye, with active choroidal neovascularization: Secondary | ICD-10-CM

## 2022-02-17 DIAGNOSIS — E1165 Type 2 diabetes mellitus with hyperglycemia: Secondary | ICD-10-CM

## 2022-02-17 DIAGNOSIS — Z794 Long term (current) use of insulin: Secondary | ICD-10-CM

## 2022-02-17 MED ORDER — BEVACIZUMAB 2.5 MG/0.1ML IZ SOSY
2.5000 mg | PREFILLED_SYRINGE | INTRAVITREAL | Status: AC | PRN
  Administered 2022-02-17: 2.5 mg via INTRAVITREAL

## 2022-02-17 NOTE — Assessment & Plan Note (Signed)
Wet AMD OD now well controlled currently at follow-up interval examination and therapy right eye of 8 weeks. ? ?Repeat injection again today maintaining/prevent recurrence of ?

## 2022-02-17 NOTE — Progress Notes (Signed)
? ? ?02/17/2022 ? ?  ? ?CHIEF COMPLAINT ?Patient presents for  ?Chief Complaint  ?Patient presents with  ? Macular Degeneration  ? ? ? ? ?HISTORY OF PRESENT ILLNESS: ?Keith Espinoza is a 81 y.o. male who presents to the clinic today for:  ? ?HPI   ?8 weeks for DILATE, OD, AVASTIN, OCT. ?Pt stated vision has been stable. ?Pt denies new floaters and FOL. ? ?Last edited by Keith Espinoza on 02/17/2022 10:24 AM.  ?  ? ? ?Referring physician: ?Keith Huddle, MD ?[address] ?[address] ?[place],  [name] [address] ? ?HISTORICAL INFORMATION:  ? ?Selected notes from the [place] ?  ? ?Lab Results  ?Component Value Date  ? HGBA1C 8.3 (A) 10/06/2021  ?  ? ?CURRENT MEDICATIONS: ?No current outpatient medications on file. (Ophthalmic Drugs)  ? ?No current facility-administered medications for this visit. (Ophthalmic Drugs)  ? ?Current Outpatient Medications (Other)  ?Medication Sig  ? acetaminophen (TYLENOL) 500 MG tablet Take 1,000 mg by mouth every 6 (six) hours as needed (pain).  ? allopurinol (ZYLOPRIM) 300 MG tablet Take 300 mg by mouth daily.  ? amLODipine (NORVASC) 5 MG tablet Take 1 tablet (5 mg total) by mouth daily.  ? aspirin EC 81 MG tablet Take 81 mg by mouth daily.  ? Cholecalciferol (VITAMIN D3) 50 MCG (2000 UT) TABS Take 2,000 Units by mouth daily.   ? clopidogrel (PLAVIX) 75 MG tablet Take 1 tablet (75 mg total) by mouth daily.  ? Cyanocobalamin (B-12) 2500 MCG TABS Take 2,500 mcg by mouth daily.  ? empagliflozin (JARDIANCE) 25 MG TABS tablet Take 1 tablet (25 mg total) by mouth daily.  ? gabapentin (NEURONTIN) 100 MG capsule Take 2 capsules (200 mg total) by mouth at bedtime. (Patient taking differently: Take 600 mg by mouth at bedtime.)  ? gabapentin (NEURONTIN) 400 MG capsule Take 1 capsule (400 mg total) by mouth 3 (three) times daily. (Patient taking differently: Take 400 mg by mouth daily.)  ? hydrochlorothiazide (HYDRODIURIL) 12.5 MG tablet Take 1 tablet (12.5 mg total) by mouth daily.  ? insulin  degludec (TRESIBA FLEXTOUCH) 100 UNIT/ML FlexTouch Pen Inject 10 Units into the skin daily.  ? Insulin Pen Needle 32G X 4 MM MISC 1 Device by Does not apply route daily in the afternoon.  ? isosorbide mononitrate (IMDUR) 30 MG 24 hr tablet Take 1 tablet (30 mg total) by mouth daily. Please schedule yearly appointment for future refills. Thank you  ? losartan (COZAAR) 50 MG tablet Take 1 tablet (50 mg total) by mouth daily.  ? metFORMIN (GLUCOPHAGE) 1000 MG tablet Take 1 tablet (1,000 mg total) by mouth 2 (two) times daily.  ? nitroGLYCERIN (NITROSTAT) 0.4 MG SL tablet Place 0.4 mg under the tongue every 5 (five) minutes x 3 doses as needed for chest pain.   ? ONE TOUCH ULTRA TEST test strip CHECK BLOOD SUGAR ONCE DAILY AS DIRECTED  ? pioglitazone (ACTOS) 30 MG tablet Take 1 tablet (30 mg total) by mouth daily.  ? polyethylene glycol (MIRALAX) 17 g packet Take 17 g by mouth 2 (two) times daily.  ? potassium chloride SA (KLOR-CON) 20 MEQ tablet Take 1 tablet (20 mEq total) by mouth daily.  ? senna-docusate (SENOKOT-S) 8.6-50 MG tablet Take 1 tablet by mouth at bedtime.  ? terazosin (HYTRIN) 5 MG capsule Take 1 capsule (5 mg total) by mouth at bedtime.  ? traMADol (ULTRAM) 50 MG tablet Take 1 tablet (50 mg total) by mouth every 6 (  six) hours as needed for moderate pain.  ? traZODone (DESYREL) 50 MG tablet Take 1 tablet (50 mg total) by mouth at bedtime as needed for sleep. (Patient taking differently: Take 50 mg by mouth 2 (two) times daily.)  ? ?No current facility-administered medications for this visit. (Other)  ? ? ? ? ?REVIEW OF SYSTEMS: ?ROS   ?Negative for: Constitutional, Gastrointestinal, Neurological, Skin, Genitourinary, Musculoskeletal, HENT, Endocrine, Cardiovascular, Eyes, Respiratory, Psychiatric, Allergic/Imm, Heme/Lymph ?Last edited by Keith Espinoza on 02/17/2022 10:24 AM.  ?  ? ? ? ?ALLERGIES ?Allergies  ?Allergen Reactions  ? Simvastatin Other (See Comments)  ?  SEVERE MYALGIAS ?  ? Zetia [Ezetimibe] Other  (See Comments)  ?  MYALGIAS  ? Dilaudid [Hydromorphone Hcl] Other (See Comments)  ?  hallucination  ? ? ?PAST MEDICAL HISTORY ?Past Medical History:  ?Diagnosis Date  ? Allergic rhinitis   ? Allergic rhinitis   ? Arthritis   ? Basal cell carcinoma 11/01/2019  ?  bcc left chest treatment [place] cx3 58f   ? Chronic leg pain   ? right  ? Chronic lower back pain   ? Coronary artery disease   ? a. Stenting to RCA 2004; staged DES to LAD and Cx 2004. DES to mRCA 2012. b. DES to mCx, PTCA to dCx 11/2011. c. Lateral wall MI 2013 s/p PTCA to distal Cx & DES to mid OM2 11/2011. d. Low risk nuc 04/2014, EF wnl.  ? COVID-19   ? Diabetes mellitus   ? Insulin dependent  ? Diabetic neuropathy ([place]   ? MILD  ? Diverticulosis   ? Dysrhythmia   ? GRosanna Randysyndrome   ? Gout   ? right wrist; right foot; right elbow; have had it since 1970's  ? H/O hiatal hernia   ? Heart murmur   ? History of echocardiogram   ? aortic sclerosis per echo 12/09 EF 65%, otherwise normal  ? History of hemorrhoids   ? BLEEDING  ? History of kidney stones   ? h/o  ? Hypertension   ? Diagnosed 1995   ? Myocardial infarction ([facility]   ? Pancreatic pseudocyst   ? a. s/p remote drainage 2006.  ? [place]rombocyt[place] ([place]   ? Seen on oldest labs in system from 2004  ? Vitamin B 12 deficiency   ? orally replaced  ? ?Past Surgical History:  ?Procedure Laterality Date  ? ABDOMINAL AORTOGRAM W/LOWER EXTREMITY Bilateral 08/08/2020  ? Procedure: ABDOMINAL AORTOGRAM W/LOWER EXTREMITY;  Surgeon: DAngelia Mould MD;  [place];  Service: Cardiovascular;  Laterality: Bilateral;  ? AMPUTATION Right 12/12/2020  ? Procedure: RIGHT BELOW KNEE AMPUTATION;  Surgeon: DNewt Minion MD;  [place]  Service: Orthopedics;  Laterality: Right;  ? BACK SURGERY    ? "total of 3 times" S/P fall   ? CARPAL TUNNEL RELEASE Bilateral   ? CHOLECYSTECTOMY  1990's  ? COLONOSCOPY    ? CORONARY ANGIOPLASTY  11/11/11  ? CORONARY ANGIOPLASTY WITH STENT PLACEMENT  09/30/2011  ? "1  then; makes a total of 4"  ? CORONARY ANGIOPLASTY WITH STENT PLACEMENT  11/11/11  ? "1; makes a total of 5"  ? INGUINAL HERNIA REPAIR  2003  ? right  ? JOINT REPLACEMENT Right 04/03/2002  ? hip replacment  ? KNEE ARTHROSCOPY  1990's  ? left  ? LEFT HEART CATHETERIZATION WITH CORONARY ANGIOGRAM N/A 09/30/2011  ? Procedure: LEFT HEART CATHETERIZATION WITH CORONARY ANGIOGRAM;  Surgeon: JJettie Booze MD;  [facility];  Service: Cardiovascular;  Laterality: N/A;  possible PCI  ? LEFT HEART CATHETERIZATION WITH CORONARY ANGIOGRAM N/A 11/15/2011  ? Procedure: LEFT HEART CATHETERIZATION WITH CORONARY ANGIOGRAM;  Surgeon: Jettie Booze, MD;  Location: [facility] CATH LAB;  Service: Cardiovascular;  Laterality: N/A;  ? PERCUTANEOUS CORONARY STENT INTERVENTION (PCI-S)  09/30/2011  ? Procedure: PERCUTANEOUS CORONARY STENT INTERVENTION (PCI-S);  Surgeon: Jettie Booze, MD;  Location[facility] CATH LAB;  Service: Cardiovascular;;  ? PERCUTANEOUS CORONARY STENT INTERVENTION (PCI-S) N/A 11/11/2011  ? Procedure: PERCUTANEOUS CORONARY STENT INTERVENTION (PCI-S);  Surgeon: Jettie Booze, MD;  Location: [facility] CATH LAB;  Service: Cardiovascular;  Laterality: N/A;  ? PERIPHERAL VASCULAR BALLOON ANGIOPLASTY Right 08/08/2020  ? Procedure: PERIPHERAL VASCULAR BALLOON ANGIOPLASTY;  Surgeon: Angelia Mould, MD;  Location: [place] CV LAB;  Service: Cardiovascular;  Laterality: Right;  Posterior tibial ?  ? SHOULDER SURGERY Right   ? X 2  ? STUMP REVISION Right 01/09/2021  ? Procedure: REVISION RIGHT BELOW KNEE AMPUTATION;  Surgeon: [name], MD;  Location: [place];  Service: Orthopedics;  Laterality: Right;  ? TONSILLECTOMY  ~ 1948  ? TOTAL HIP REVISION Right 04/13/2019  ? Procedure: RIGHT TOTAL HIP REVISION-POSTERIOR  APPROACH LATERAL;  Surgeon: Marybelle Killings, MD;  Location: [place];  Service: Orthopedics;  Laterality: Right;  ? TOTAL KNEE ARTHROPLASTY Left 07/23/2016  ? Procedure: LEFT TOTAL KNEE ARTHROPLASTY;  Surgeon:  Marybelle Killings, MD;  Location: [place];  Service: Orthopedics;  Laterality: Left;  ? ? ?FAMILY HISTORY ?Family History  ?Problem Relation Age of Onset  ? Diabetes Mother   ? Hyperlipidemia Mother   ? Hyperten

## 2022-02-17 NOTE — Assessment & Plan Note (Signed)
OS now 1.5 weeks post most recent injection follow-up as scheduled looks great ?

## 2022-02-22 DIAGNOSIS — H8111 Benign paroxysmal vertigo, right ear: Secondary | ICD-10-CM | POA: Diagnosis not present

## 2022-03-02 ENCOUNTER — Encounter: Payer: Self-pay | Admitting: *Deleted

## 2022-03-04 DIAGNOSIS — H8111 Benign paroxysmal vertigo, right ear: Secondary | ICD-10-CM | POA: Diagnosis not present

## 2022-03-09 DIAGNOSIS — H8111 Benign paroxysmal vertigo, right ear: Secondary | ICD-10-CM | POA: Diagnosis not present

## 2022-03-13 ENCOUNTER — Other Ambulatory Visit: Payer: Self-pay | Admitting: Internal Medicine

## 2022-03-22 ENCOUNTER — Ambulatory Visit
Admission: RE | Admit: 2022-03-22 | Discharge: 2022-03-22 | Disposition: A | Discharge status: Home / Self Care | Payer: Medicare Other | Source: Ambulatory Visit | Attending: Orthopedic Surgery | Admitting: Orthopedic Surgery

## 2022-03-22 DIAGNOSIS — M25512 Pain in left shoulder: Secondary | ICD-10-CM

## 2022-03-22 DIAGNOSIS — M25412 Effusion, left shoulder: Secondary | ICD-10-CM | POA: Diagnosis not present

## 2022-03-22 DIAGNOSIS — S46012A Strain of muscle(s) and tendon(s) of the rotator cuff of left shoulder, initial encounter: Secondary | ICD-10-CM | POA: Diagnosis not present

## 2022-03-22 DIAGNOSIS — S46112A Strain of muscle, fascia and tendon of long head of biceps, left arm, initial encounter: Secondary | ICD-10-CM | POA: Diagnosis not present

## 2022-04-05 ENCOUNTER — Ambulatory Visit (INDEPENDENT_AMBULATORY_CARE_PROVIDER_SITE_OTHER): Payer: Medicare Other | Admitting: Orthopedic Surgery

## 2022-04-05 ENCOUNTER — Ambulatory Visit (INDEPENDENT_AMBULATORY_CARE_PROVIDER_SITE_OTHER): Payer: Medicare Other | Admitting: Ophthalmology

## 2022-04-05 DIAGNOSIS — H353221 Exudative age-related macular degeneration, left eye, with active choroidal neovascularization: Secondary | ICD-10-CM | POA: Diagnosis not present

## 2022-04-05 DIAGNOSIS — E1165 Type 2 diabetes mellitus with hyperglycemia: Secondary | ICD-10-CM | POA: Diagnosis not present

## 2022-04-05 DIAGNOSIS — Z794 Long term (current) use of insulin: Secondary | ICD-10-CM

## 2022-04-05 DIAGNOSIS — M25512 Pain in left shoulder: Secondary | ICD-10-CM

## 2022-04-05 DIAGNOSIS — H353211 Exudative age-related macular degeneration, right eye, with active choroidal neovascularization: Secondary | ICD-10-CM

## 2022-04-05 MED ORDER — BEVACIZUMAB 2.5 MG/0.1ML IZ SOSY
2.5000 mg | PREFILLED_SYRINGE | INTRAVITREAL | Status: AC | PRN
  Administered 2022-04-05: 2.5 mg via INTRAVITREAL

## 2022-04-05 NOTE — Progress Notes (Signed)
04/05/2022     CHIEF COMPLAINT Patient presents for  Chief Complaint  Patient presents with   Macular Degeneration      HISTORY OF PRESENT ILLNESS: Keith Espinoza is a 81 y.o. male who presents to the clinic today for:   HPI   8 weeks for DILATE OS, AVASTIN OCT OU & 7 WEEKS FOR  DILATE OU, COLOR FP, OCT. Pt stated vision has good days and bad days.  Last edited by Hurman Horn, MD on 04/05/2022 10:40 AM.      Referring physician: [name], MD [place] [place],  [place] [address]  HISTORICAL INFORMATION:   Selected notes from the MEDICAL RECORD NUMBER    Lab Results  Component Value Date   HGBA1C 8.3 (A) 10/06/2021     CURRENT MEDICATIONS: No current outpatient medications on file. (Ophthalmic Drugs)   No current facility-administered medications for this visit. (Ophthalmic Drugs)   Current Outpatient Medications (Other)  Medication Sig   acetaminophen (TYLENOL) 500 MG tablet Take 1,000 mg by mouth every 6 (six) hours as needed (pain).   allopurinol (ZYLOPRIM) 300 MG tablet Take 300 mg by mouth daily.   amLODipine (NORVASC) 5 MG tablet Take 1 tablet (5 mg total) by mouth daily.   aspirin EC 81 MG tablet Take 81 mg by mouth daily.   Cholecalciferol (VITAMIN D3) 50 MCG (2000 UT) TABS Take 2,000 Units by mouth daily.    clopidogrel (PLAVIX) 75 MG tablet Take 1 tablet (75 mg total) by mouth daily.   Cyanocobalamin (B-12) 2500 MCG TABS Take 2,500 mcg by mouth daily.   empagliflozin (JARDIANCE) 25 MG TABS tablet Take 1 tablet (25 mg total) by mouth daily.   gabapentin (NEURONTIN) 100 MG capsule Take 2 capsules (200 mg total) by mouth at bedtime. (Patient taking differently: Take 600 mg by mouth at bedtime.)   gabapentin (NEURONTIN) 400 MG capsule Take 1 capsule (400 mg total) by mouth 3 (three) times daily. (Patient taking differently: Take 400 mg by mouth daily.)   hydrochlorothiazide (HYDRODIURIL) 12.5 MG tablet Take 1 tablet (12.5 mg total) by  mouth daily.   insulin degludec (TRESIBA FLEXTOUCH) 100 UNIT/ML FlexTouch Pen Inject 10 Units into the skin daily.   Insulin Pen Needle 32G X 4 MM MISC 1 Device by Does not apply route daily in the afternoon.   isosorbide mononitrate (IMDUR) 30 MG 24 hr tablet Take 1 tablet (30 mg total) by mouth daily. Please schedule yearly appointment for future refills. Thank you   losartan (COZAAR) 50 MG tablet Take 1 tablet (50 mg total) by mouth daily.   metFORMIN (GLUCOPHAGE) 1000 MG tablet Take 1 tablet (1,000 mg total) by mouth 2 (two) times daily.   nitroGLYCERIN (NITROSTAT) 0.4 MG SL tablet Place 0.4 mg under the tongue every 5 (five) minutes x 3 doses as needed for chest pain.    ONE TOUCH ULTRA TEST test strip CHECK BLOOD SUGAR ONCE DAILY AS DIRECTED   pioglitazone (ACTOS) 30 MG tablet Take 1 tablet (30 mg total) by mouth daily.   polyethylene glycol (MIRALAX) 17 g packet Take 17 g by mouth 2 (two) times daily.   potassium chloride SA (KLOR-CON) 20 MEQ tablet Take 1 tablet (20 mEq total) by mouth daily.   senna-docusate (SENOKOT-S) 8.6-50 MG tablet Take 1 tablet by mouth at bedtime.   terazosin (HYTRIN) 5 MG capsule Take 1 capsule (5 mg total) by mouth at bedtime.   traMADol (ULTRAM) 50 MG tablet  Take 1 tablet (50 mg total) by mouth every 6 (six) hours as needed for moderate pain.   traZODone (DESYREL) 50 MG tablet Take 1 tablet (50 mg total) by mouth at bedtime as needed for sleep. (Patient taking differently: Take 50 mg by mouth 2 (two) times daily.)   No current facility-administered medications for this visit. (Other)      REVIEW OF SYSTEMS: ROS   Negative for: Constitutional, Gastrointestinal, Neurological, Skin, Genitourinary, Musculoskeletal, HENT, Endocrine, Cardiovascular, Eyes, Respiratory, Psychiatric, Allergic/Imm, Heme/Lymph Last edited by Silvestre Moment on 04/05/2022 10:04 AM.       ALLERGIES Allergies  Allergen Reactions   Simvastatin Other (See Comments)    SEVERE MYALGIAS     Zetia [Ezetimibe] Other (See Comments)    MYALGIAS   Dilaudid [Hydromorphone Hcl] Other (See Comments)    hallucination    PAST MEDICAL HISTORY Past Medical History:  Diagnosis Date   Allergic rhinitis    Allergic rhinitis    Arthritis    Basal cell carcinoma 11/01/2019    bcc left chest treatment TX cx3 82f    Chronic leg pain    right   Chronic lower back pain    Coronary artery disease    a. Stenting to RCA 2004; staged DES to LAD and Cx 2004. DES to mRCA 2012. b. DES to mCx, PTCA to dCx 11/2011. c. Lateral wall MI 2013 s/p PTCA to distal Cx & DES to mid OM2 11/2011. d. Low risk nuc 04/2014, EF wnl.   COVID-19    Diabetes mellitus    Insulin dependent   Diabetic neuropathy ([facility]    MILD   Diverticulosis    Dysrhythmia    GRosanna Randysyndrome    Gout    right wrist; right foot; right elbow; have had it since 1970's   H/O hiatal hernia    Heart murmur    History of echocardiogram    aortic sclerosis per echo 12/09 EF 65%, otherwise normal   History of hemorrhoids    BLEEDING   History of kidney stones    h/o   Hypertension    Diagnosed 1995    Myocardial infarction ([facility]    Pancreatic pseudocyst    a. s/p remote drainage 2006.   Thrombocytopenia ([facility]    Seen on oldest labs in system from 2004   Vitamin B 12 deficiency    orally replaced   Past Surgical History:  Procedure Laterality Date   ABDOMINAL AORTOGRAM W/LOWER EXTREMITY Bilateral 08/08/2020   Procedure: ABDOMINAL AORTOGRAM W/LOWER EXTREMITY;  Surgeon: DAngelia Mould MD;  [facility];  Service: Cardiovascular;  Laterality: Bilateral;   AMPUTATION Right 12/12/2020   Procedure: RIGHT BELOW KNEE AMPUTATION;  Surgeon: DNewt Minion MD;  [place] [facility]  Service: Orthopedics;  Laterality: Right;   BACK SURGERY     "total of 3 times" S/P fall    CARPAL TUNNEL RELEASE Bilateral    CHOLECYSTECTOMY  1990's   COLONOSCOPY     CORONARY ANGIOPLASTY  11/11/11   CORONARY ANGIOPLASTY WITH STENT  PLACEMENT  09/30/2011   "1 then; makes a total of 4"   CORONARY ANGIOPLASTY WITH STENT PLACEMENT  11/11/11   "1; makes a total of 5"   INGUINAL HERNIA REPAIR  2003   right   JOINT REPLACEMENT Right 04/03/2002   hip replacment   KNEE ARTHROSCOPY  1990's   left   LEFT HEART CATHETERIZATION WITH CORONARY ANGIOGRAM N/A 09/30/2011   Procedure: LEFT HEART CATHETERIZATION WITH CORONARY  ANGIOGRAM;  Surgeon: Jettie Booze, MD;  Location[facility] C[facility] LAB;  Service: Cardiovascular;  Laterality: N/A;  possible PCI   LEFT HEART CATHETERIZATION WITH CORONARY ANGIOGRAM N/A 11/15/2011   Procedure: LEFT HEART CATHETERIZATION WITH CORONARY ANGIOGRAM;  Surgeon: Jettie Booze, MD;  Location: [facility] CATH LAB;  Service: Cardiovascular;  Laterality: N/A;   PERCUTANEOUS CORONARY STENT INTERVENTION (PCI-S)  09/30/2011   Procedure: PERCUTANEOUS CORONARY STENT INTERVENTION (PCI-S);  Surgeon: Jettie Booze, MD;  Location: [facility] CATH LAB;  Service: Cardiovascular;;   PERCUTANEOUS CORONARY STENT INTERVENTION (PCI-S) N/A 11/11/2011   Procedure: PERCUTANEOUS CORONARY STENT INTERVENTION (PCI-S);  Surgeon: Jettie Booze, MD;  Location: [facility] CATH LAB;  Service: Cardiovascular;  Laterality: N/A;   PERIPHERAL VASCULAR BALLOON ANGIOPLASTY Right 08/08/2020   Procedure: PERIPHERAL VASCULAR BALLOON ANGIOPLASTY;  Surgeon: Angelia Mould, MD;  Location: [place] CV LAB;  Service: Cardiovascular;  Laterality: Right;  Posterior tibial    SHOULDER SURGERY Right    X 2   STUMP REVISION Right 01/09/2021   Procedure: REVISION RIGHT BELOW KNEE AMPUTATION;  Surgeon: [name], MD;  Location: [place];  Service: Orthopedics;  Laterality: Right;   TONSILLECTOMY  ~ [place] Right 04/13/2019   Procedure: RIGHT TOTAL HIP REVISION-POSTERIOR  APPROACH LATERAL;  Surgeon: Marybelle Killings, MD;  Location: [place];  Service: Orthopedics;  Laterality: Right;   TOTAL KNEE ARTHROPLASTY Left 07/23/2016   Procedure: LEFT TOTAL  KNEE ARTHROPLASTY;  Surgeon: Marybelle Killings, MD;  Location: [place];  Service: Orthopedics;  Laterality: Left;    FAMILY HISTORY Family History  Problem Relation Age of Onset   Diabetes Mother    Hyperlipidemia Mother    Hypertension Mother    Cancer Father    Hypertension Father    Diabetes Sister    Hypertension Sister    Cancer Brother    Heart attack Neg Hx     SOCIAL HISTORY Social History   Tobacco Use   Smoking status: Former   Smokeless tobacco: Current    Types: Chew   Tobacco comments:    quit 60 years ago  Vaping Use   Vaping Use: Never used  Substance Use Topics   Alcohol use: No   Drug use: No         OPHTHALMIC EXAM:  Base Eye Exam     Visual Acuity (ETDRS)       Right Left   Dist cc 20/25 +2 20/30   Dist ph cc  20/25 -2    Correction: Glasses         Tonometry (Tonopen, 10:10 AM)       Right Left   Pressure 12 15         Pupils       Pupils APD   Right PERRL None   Left PERRL None         Visual Fields       Left Right    Full Full         Extraocular Movement       Right Left    Full Full         Neuro/Psych     Oriented x3: Yes   Mood/Affect: Normal         Dilation     Both eyes: 1.0% Mydriacyl, 2.5% Phenylephrine @ 10:10 AM           Slit Lamp and Fundus Exam     External Exam  Right Left   External Normal Normal         Slit Lamp Exam       Right Left   Lids/Lashes Normal Normal   Conjunctiva/Sclera White and quiet White and quiet   Cornea Clear Clear   Anterior Chamber Deep and quiet Deep and quiet   Iris Round and reactive Round and reactive   Lens Centered posterior chamber intraocular lens Centered posterior chamber intraocular lens   Anterior Vitreous Normal Normal         Fundus Exam       Right Left   Posterior Vitreous Normal Normal   Disc Normal Normal   C/D Ratio 0.25 2.5   Macula Retinal pigment epithelial mottling, Intermediate age related macular  degeneration Retinal pigment epithelial mottling, Intermediate age related macular degeneration   Vessels Normal, no DR Normal, no DR   Periphery Normal Normal            IMAGING AND PROCEDURES  Imaging and Procedures for 04/05/22  OCT, Retina - OU - Both Eyes       Right Eye Quality was borderline. Scan locations included subfoveal. Central Foveal Thickness: 235. Progression has worsened. Findings include abnormal foveal contour, intraretinal hyper-reflective material, intraretinal fluid, subretinal scarring, inner retinal atrophy.   Left Eye Quality was poor. Scan locations included subfoveal. Central Foveal Thickness: 241. Progression has been stable. Findings include abnormal foveal contour, retinal drusen , intraretinal hyper-reflective material, intraretinal fluid.   Notes OD much vastly improved OD post injection Avastin.  Currently at  7 weeks.  Repeat injection OD today to maintain  OS, macular findings also vastly improved currently at 8 weeks post most recent injection follow-up as scheduled, also repeat injection OS today     Intravitreal Injection, Pharmacologic Agent - OD - Right Eye       Time Out 04/05/2022. 10:46 AM. Confirmed correct patient, procedure, site, and patient consented.   Anesthesia Topical anesthesia was used. Anesthetic medications included Lidocaine 4%.   Procedure Preparation included 5% betadine to ocular surface, 10% betadine to eyelids, Tobramycin 0.3%, Ofloxacin . A 30 gauge needle was used.   Injection: 2.5 mg bevacizumab 2.5 MG/0.1ML   Route: Intravitreal, Site: Right Eye   NDC: [phone], Lot: [id number], Expiration date: 06/11/2022   Post-op Post injection exam found visual acuity of at least counting fingers. The patient tolerated the procedure well. There were no complications. The patient received written and verbal post procedure care education. Post injection medications included ocuflox.      Intravitreal Injection,  Pharmacologic Agent - OS - Left Eye       Time Out 04/05/2022. 10:46 AM. Confirmed correct patient, procedure, site, and patient consented.   Anesthesia Topical anesthesia was used. Anesthetic medications included Lidocaine 4%.   Procedure Preparation included 5% betadine to ocular surface, 10% betadine to eyelids, Tobramycin 0.3%. A 30 gauge needle was used.   Injection: 2.5 mg bevacizumab 2.5 MG/0.1ML   Route: Intravitreal, Site: Left Eye   NDC: [phone], Lot: [id number], Expiration date: 06/04/2022   Post-op Post injection exam found visual acuity of at least counting fingers. The patient tolerated the procedure well. There were no complications. The patient received written and verbal post procedure care education. Post injection medications included ocuflox.              ASSESSMENT/PLAN:  Exudative age-related macular degeneration of right eye with active choroidal neovascularization (HCC) OD at 7-week follow-up.  Repeat injection today to maintain with recent recurrence  and CNVM  Exudative age-related macular degeneration of left eye with active choroidal neovascularization ([place]) OS doing well at 8 weeks follow-up today.  Repeat injection and maintain 7 to 8 weeks  Type 2 diabetes mellitus with hyperglycemia, with long-term current use of insulin (HCC) No detectable diabetic retinopathy today     ICD-10-CM   1. Exudative age-related macular degeneration of left eye with active choroidal neovascularization (HCC)  H35.3221 OCT, Retina - OU - Both Eyes    Intravitreal Injection, Pharmacologic Agent - OS - Left Eye    bevacizumab (AVASTIN) SOSY 2.5 mg    CANCELED: Color Fundus Photography Optos - OU - Both Eyes    2. Exudative age-related macular degeneration of right eye with active choroidal neovascularization (HCC)  H35.3211 OCT, Retina - OU - Both Eyes    Intravitreal Injection, Pharmacologic Agent - OD - Right Eye    bevacizumab (AVASTIN) SOSY 2.5 mg    CANCELED:  Color Fundus Photography Optos - OU - Both Eyes    3. Type 2 diabetes mellitus with hyperglycemia, with long-term current use of insulin (HCC)  E11.65    Z79.4       1.  OD at 7-week follow-up, stabilized condition.  No recurrent CNVM with preserved acuity repeat injection Avastin today  2.  OS at 8-week follow-up, stabilized condition, no sign of recurrent CNVM repeat injection Avastin today  3.  Maintain follow-up interval next 7 weeks  Ophthalmic Meds Ordered this visit:  Meds ordered this encounter  Medications   bevacizumab (AVASTIN) SOSY 2.5 mg   bevacizumab (AVASTIN) SOSY 2.5 mg       Return in about 7 weeks (around 05/24/2022) for DILATE OU, AVASTIN OCT, OD, OS.  There are no Patient Instructions on file for this visit.   Explained the diagnoses, plan, and follow up with the patient and they expressed understanding.  Patient expressed understanding of the importance of proper follow up care.   Clent Demark Maira Christon M.D. Diseases & Surgery of the Retina and Vitreous [facility] [place] 04/05/22     Abbreviations: M myopia (nearsighted); A astigmatism; H hyperopia (farsighted); P presbyopia; Mrx spectacle prescription;  CTL contact lenses; OD right eye; OS left eye; OU both eyes  XT exotropia; ET esotropia; PEK punctate epithelial keratitis; PEE punctate epithelial erosions; DES dry eye syndrome; MGD meibomian gland dysfunction; ATs artificial tears; PFAT's preservative free artificial tears; [place] nuclear sclerotic cataract; PSC posterior subcapsular cataract; ERM epi-retinal membrane; PVD posterior vitreous detachment; RD retinal detachment; DM diabetes mellitus; DR diabetic retinopathy; NPDR non-proliferative diabetic retinopathy; PDR proliferative diabetic retinopathy; CSME clinically significant macular edema; DME diabetic macular edema; dbh dot blot hemorrhages; CWS cotton wool spot; POAG primary open angle glaucoma; C/D cup-to-disc ratio; HVF humphrey visual field;  GVF goldmann visual field; OCT optical coherence tomography; IOP intraocular pressure; BRVO Branch retinal vein occlusion; CRVO central retinal vein occlusion; CRAO central retinal artery occlusion; BRAO branch retinal artery occlusion; RT retinal tear; SB scleral buckle; PPV pars plana vitrectomy; VH Vitreous hemorrhage; PRP panretinal laser photocoagulation; IVK intravitreal kenalog; VMT vitreomacular traction; MH Macular hole;  NVD neovascularization of the disc; NVE neovascularization elsewhere; AREDS age related eye disease study; ARMD age related macular degeneration; POAG primary open angle glaucoma; EBMD epithelial/anterior basement membrane dystrophy; ACIOL anterior chamber intraocular lens; IOL intraocular lens; PCIOL posterior chamber intraocular lens; Phaco/IOL phacoemulsification with intraocular lens placement; [place] photorefractive keratectomy; LASIK laser assisted in situ keratomileusis; HTN hypertension; DM diabetes mellitus; COPD chronic obstructive pulmonary disease

## 2022-04-05 NOTE — Assessment & Plan Note (Signed)
No detectable diabetic retinopathy today

## 2022-04-05 NOTE — Assessment & Plan Note (Signed)
OD at 7-week follow-up.  Repeat injection today to maintain with recent recurrence and CNVM

## 2022-04-05 NOTE — Assessment & Plan Note (Signed)
OS doing well at 8 weeks follow-up today.  Repeat injection and maintain 7 to 8 weeks

## 2022-04-07 ENCOUNTER — Encounter (INDEPENDENT_AMBULATORY_CARE_PROVIDER_SITE_OTHER): Payer: Medicare Other | Admitting: Ophthalmology

## 2022-04-10 ENCOUNTER — Encounter: Payer: Self-pay | Admitting: Orthopedic Surgery

## 2022-04-10 NOTE — Progress Notes (Signed)
Office Visit Note   Patient: Keith Espinoza           Date of Birth: [birth year]           MRN: [id number] Visit Date: 04/05/2022 Requested by: Keith Huddle, MD [address] [place],  [place] [address] PCP: Keith Huddle, MD  Subjective: Chief Complaint  Patient presents with   Other     Scan review    HPI: Keith Espinoza is an 81 year old patient here for review of MRI scan of his left shoulder.  Patient has pain in the left shoulder but overall he is functional.  He is able to sleep on the left-hand side.  MRI scan is reviewed.  He has retracted supraspinatus and subscap tears along with some superior migration of the humeral head.              ROS: All systems reviewed are negative as they relate to the chief complaint within the history of present illness.  Patient denies  fevers or chills.   Assessment & Plan: Visit Diagnoses:  1. Left shoulder pain, unspecified chronicity     Plan: Impression is rotator cuff arthropathy left shoulder but with very good forward flexion function.  Pain really does not reach the level to consider reverse shoulder replacement.  He should be careful how he lifts things so that route the remaining rotator cuff function he has can stay in place to allow him to be as functional as he is with the rotator cuff deficiency discovered on the MRI scan.  For now no surgical indications indicated.  He will follow-up as needed.  Follow-Up Instructions: Return if symptoms worsen or fail to improve.   Orders:  No orders of the defined types were placed in this encounter.  No orders of the defined types were placed in this encounter.     Procedures: No procedures performed   Clinical Data: No additional findings.  Objective: Vital Signs: There were no vitals taken for this visit.  Physical Exam:   Constitutional: Patient appears well-developed HEENT:  Head: Normocephalic Eyes:EOM are normal Neck: Normal range of motion Cardiovascular:  Normal rate Pulmonary/chest: Effort normal Neurologic: Patient is alert Skin: Skin is warm Psychiatric: Patient has normal mood and affect   Ortho Exam: Ortho exam demonstrates full active and passive range of motion of the cervical spine.  Left shoulder has forward flexion and abduction both above 90 degrees.  Does have some weakness to external rotation internal rotation testing with some crepitus present but is not particularly painful.  No Popeye deformity present.  Motor or sensory function of the hand is intact.  No other masses lymphadenopathy or skin changes noted in the shoulder girdle region.  Specialty Comments:  No specialty comments available.  Imaging: No results found.   PMFS History: Patient Active Problem List   Diagnosis Date Noted   Type 2 diabetes mellitus with hyperglycemia, with long-term current use of insulin ([place]) 02/15/2022   Adenoma of left adrenal gland 02/15/2022   History of right below knee amputation ([place]) 10/06/2021   Exudative age-related macular degeneration of left eye with active choroidal neovascularization ([place]) 07/28/2021   Exudative age-related macular degeneration of right eye with active choroidal neovascularization ([place]) 06/01/2021   Intermediate stage nonexudative age-related macular degeneration of both eyes 06/01/2021   Type 2 diabetes mellitus with diabetic polyneuropathy, with long-term current use of insulin ([place]) 03/24/2021   Hypokalemia    Constipation 02/02/2021   Fecal impaction ([place]) 02/01/2021  Wound dehiscence 01/09/2021   Dehiscence of amputation stump ([place])    Status post percutaneous transluminal coronary angioplasty 01/06/2021   Type II diabetes mellitus, uncontrolled 01/06/2021   Acute pancreatitis 01/06/2021   Diabetic peripheral vascular disease ([place]) 01/06/2021   Encounter for screening for other disorder 01/06/2021   Enlarged prostate 01/06/2021   Foot ulcer, right ([place]) 01/06/2021   Gout 01/06/2021   Headache  01/06/2021   Neck pain 01/06/2021   Hypoglycemia 01/06/2021   Loss of appetite 01/06/2021   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   Peripheral neuropathy 01/06/2021   Sciatica 01/06/2021   Vitamin B12 deficiency 01/06/2021   Vitamin D deficiency 01/06/2021   Weakness 01/06/2021   Diabetes mellitus type 2 with neurological manifestations ([place]) 01/06/2021   Protein-calorie malnutrition, severe 12/26/2020   Acute blood loss anemia 12/26/2020   Prerenal azotemia 12/26/2020   Below-knee amputation of right lower extremity ([place]) 12/12/2020   Gangrene of right foot ([place])    Diabetic neuropathy ([place]) 11/17/2020   Hyperglycemia due to type 2 diabetes mellitus ([place]) 11/17/2020   Long term (current) use of insulin ([place]) 11/17/2020   Obesity 11/17/2020   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/2019   Hip dislocation, right ([place]) 04/13/2019   Other intervertebral disc degeneration, lumbar region 03/30/2019   CAD (coronary artery disease) 01/30/2019   Tobacco abuse 01/30/2019   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   Burn, foot, second degree, left, initial encounter 06/08/2017   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 03/16/2017   S/P total knee arthroplasty, left 10/26/2016   Hyperlipidemia 09/04/2014   Thrombocytopenia (HCC)    Precordial chest pain 04/05/2014   Coronary atherosclerosis of native coronary artery 10/01/2013   Other and unspecified hyperlipidemia 10/01/2013   Primary hypertension 10/01/2013   Diabetes mellitus ([place]) 10/01/2013   Esophageal reflux 10/01/2013   Hypertrophy of prostate without urinary obstruction and other lower urinary tract symptoms (LUTS) 10/01/2013   Past Medical History:  Diagnosis Date   Allergic rhinitis    Allergic rhinitis    Arthritis    Basal cell carcinoma 11/01/2019    bcc left chest treatment TX cx3 81f    Chronic leg pain    right   Chronic lower back pain    Coronary artery disease    a. Stenting to RCA 2004; staged  DES to LAD and Cx 2004. DES to mRCA 2012. b. DES to mCx, PTCA to dCx 11/2011. c. Lateral wall MI 2013 s/p PTCA to distal Cx & DES to mid OM2 11/2011. d. Low risk nuc 04/2014, EF wnl.   COVID-19    Diabetes mellitus    Insulin dependent   Diabetic neuropathy ([place]    MILD   Diverticulosis    Dysrhythmia    GRosanna Randysyndrome    Gout    right wrist; right foot; right elbow; have had it since 1970's   H/O hiatal hernia    Heart murmur    History of echocardiogram    aortic sclerosis per echo 12/09 EF 65%, otherwise normal   History of hemorrhoids    BLEEDING   History of kidney stones    h/o   Hypertension    Diagnosed 1995    Myocardial infarction ([facility]    Pancreatic pseudocyst    a. s/p remote drainage 2006.   Thrombocytopenia ([place]    Seen on oldest labs in system from 2004   Vitamin B 12 deficiency    orally replaced    Family History  Problem Relation  Age of Onset   Diabetes Mother    Hyperlipidemia Mother    Hypertension Mother    Cancer Father    Hypertension Father    Diabetes Sister    Hypertension Sister    Cancer Brother    Heart attack Neg Hx     Past Surgical History:  Procedure Laterality Date   ABDOMINAL AORTOGRAM W/LOWER EXTREMITY Bilateral 08/08/2020   Procedure: ABDOMINAL AORTOGRAM W/LOWER EXTREMITY;  Surgeon: Angelia Mould, MD;  Location: [place] CV LAB;  Service: Cardiovascular;  Laterality: Bilateral;   AMPUTATION Right 12/12/2020   Procedure: RIGHT BELOW KNEE AMPUTATION;  Surgeon: [name], MD;  Location: [place];  Service: Orthopedics;  Laterality: Right;   BACK SURGERY     "total of 3 times" S/P fall    CARPAL TUNNEL RELEASE Bilateral    CHOLECYSTECTOMY  1990's   COLONOSCOPY     CORONARY ANGIOPLASTY  11/11/11   CORONARY ANGIOPLASTY WITH STENT PLACEMENT  09/30/2011   "1 then; makes a total of 4"   CORONARY ANGIOPLASTY WITH STENT PLACEMENT  11/11/11   "1; makes a total of 5"   INGUINAL HERNIA REPAIR  2003   right   JOINT REPLACEMENT  Right 04/03/2002   hip replacment   KNEE ARTHROSCOPY  1990's   left   LEFT HEART CATHETERIZATION WITH CORONARY ANGIOGRAM N/A 09/30/2011   Procedure: LEFT HEART CATHETERIZATION WITH CORONARY ANGIOGRAM;  Surgeon: Jettie Booze, MD;  Location[facility] C[facility] LAB;  Service: Cardiovascular;  Laterality: N/A;  possible PCI   LEFT HEART CATHETERIZATION WITH CORONARY ANGIOGRAM N/A 11/15/2011   Procedure: LEFT HEART CATHETERIZATION WITH CORONARY ANGIOGRAM;  Surgeon: Jettie Booze, MD;  Location: [facility] CATH LAB;  Service: Cardiovascular;  Laterality: N/A;   PERCUTANEOUS CORONARY STENT INTERVENTION (PCI-S)  09/30/2011   Procedure: PERCUTANEOUS CORONARY STENT INTERVENTION (PCI-S);  Surgeon: Jettie Booze, MD;  Location: [facility] CATH LAB;  Service: Cardiovascular;;   PERCUTANEOUS CORONARY STENT INTERVENTION (PCI-S) N/A 11/11/2011   Procedure: PERCUTANEOUS CORONARY STENT INTERVENTION (PCI-S);  Surgeon: Jettie Booze, MD;  Location: [facility] CATH LAB;  Service: Cardiovascular;  Laterality: N/A;   PERIPHERAL VASCULAR BALLOON ANGIOPLASTY Right 08/08/2020   Procedure: PERIPHERAL VASCULAR BALLOON ANGIOPLASTY;  Surgeon: Angelia Mould, MD;  Location: [place] CV LAB;  Service: Cardiovascular;  Laterality: Right;  Posterior tibial    SHOULDER SURGERY Right    X 2   STUMP REVISION Right 01/09/2021   Procedure: REVISION RIGHT BELOW KNEE AMPUTATION;  Surgeon: [name], MD;  Location: [place];  Service: Orthopedics;  Laterality: Right;   TONSILLECTOMY  ~ [place] Right 04/13/2019   Procedure: RIGHT TOTAL HIP REVISION-POSTERIOR  APPROACH LATERAL;  Surgeon: Marybelle Killings, MD;  Location: [place];  Service: Orthopedics;  Laterality: Right;   TOTAL KNEE ARTHROPLASTY Left 07/23/2016   Procedure: LEFT TOTAL KNEE ARTHROPLASTY;  Surgeon: Marybelle Killings, MD;  Location: [place];  Service: Orthopedics;  Laterality: Left;   Social History   Occupational History   Occupation: Retired  Tobacco Use    Smoking status: Former   Smokeless tobacco: Current    Types: Chew   Tobacco comments:    quit 60 years ago  Vaping Use   Vaping Use: Never used  Substance and Sexual Activity   Alcohol use: No   Drug use: No   Sexual activity: Not Currently

## 2022-04-14 NOTE — Progress Notes (Signed)
Cardiology Office Note   Date:  04/15/2022   ID:  Keith Espinoza, DOB [birth year], MRN [id number]  PCP:  Keith Huddle, MD    No chief complaint on file.  CAD  Wt Readings from Last 3 Encounters:  04/15/22 154 lb 9.6 oz (70.1 kg)  02/12/22 151 lb (68.5 kg)  10/15/21 150 lb (68 kg)       History of Present Illness: Keith Espinoza is a 81 y.o. male    with history of CAD S/P stenting RCA 2004 with staged DES-LAD and Cfx 2004, DES RCA 2012, DES mCfx, PTCA dCfx 2013, low risk NST 04/2014.   He was advised to quit chewing tobacco.     He had COVID in Jan 2022.  No hospital stay required.     He had a leg amputation in 12/29/22. His wife, Keith Espinoza, died in January 29, 2023.  She did not tolerate dialysis to ventricular tach.     Received prosthetic leg.    Denies : exertional Chest pain. Dizziness. Leg edema. Nitroglycerin use. Orthopnea. Palpitations. Paroxysmal nocturnal dyspnea. Shortness of breath. Syncope.      Past Medical History:  Diagnosis Date   Allergic rhinitis    Allergic rhinitis    Arthritis    Basal cell carcinoma 11/01/2019    bcc left chest treatment TX cx3 36f    Chronic leg pain    right   Chronic lower back pain    Coronary artery disease    a. Stenting to RCA 2004; staged DES to LAD and Cx 2004. DES to mRCA 2012. b. DES to mCx, PTCA to dCx 11/2011. c. Lateral wall MI 2013 s/p PTCA to distal Cx & DES to mid OM2 11/2011. d. Low risk nuc 04/2014, EF wnl.   COVID-19    Diabetes mellitus    Insulin dependent   Diabetic neuropathy ([place]    MILD   Diverticulosis    Dysrhythmia    Keith Espinoza    Gout    right wrist; right foot; right elbow; have had it since 1970's   H/O hiatal hernia    Heart murmur    History of echocardiogram    aortic sclerosis per echo 12/09 EF 65%, otherwise normal   History of hemorrhoids    BLEEDING   History of kidney stones    h/o   Hypertension    Diagnosed 1995    Myocardial infarction (Keith Espinoza    Pancreatic pseudocyst    a.  s/p remote drainage 2006.   Thrombocytopenia ([place]    Seen on oldest labs in system from 2004   Vitamin B 12 deficiency    orally replaced    Past Surgical History:  Procedure Laterality Date   ABDOMINAL AORTOGRAM W/LOWER EXTREMITY Bilateral 08/08/2020   Procedure: ABDOMINAL AORTOGRAM W/LOWER EXTREMITY;  Surgeon: DAngelia Mould MD;  [facility] [place];  Service: Cardiovascular;  Laterality: Bilateral;   AMPUTATION Right 12/12/2020   Procedure: RIGHT BELOW KNEE AMPUTATION;  Surgeon: DNewt Minion MD;  [place]  Service: Orthopedics;  Laterality: Right;   BACK SURGERY     "total of 3 times" S/P fall    CARPAL TUNNEL RELEASE Bilateral    CHOLECYSTECTOMY  1990's   COLONOSCOPY     CORONARY ANGIOPLASTY  11/11/11   CORONARY ANGIOPLASTY WITH STENT PLACEMENT  09/30/2011   "1 then; makes a total of 4"   CORONARY ANGIOPLASTY WITH STENT PLACEMENT  11/11/11   "1; makes a total of 5"  INGUINAL HERNIA REPAIR  2003   right   JOINT REPLACEMENT Right 04/03/2002   hip replacment   KNEE ARTHROSCOPY  1990's   left   LEFT HEART CATHETERIZATION WITH CORONARY ANGIOGRAM N/A 09/30/2011   Procedure: LEFT HEART CATHETERIZATION WITH CORONARY ANGIOGRAM;  Surgeon: Jettie Booze, MD;  Location: [facility] CATH LAB;  Service: Cardiovascular;  Laterality: N/A;  possible PCI   LEFT HEART CATHETERIZATION WITH CORONARY ANGIOGRAM N/A 11/15/2011   Procedure: LEFT HEART CATHETERIZATION WITH CORONARY ANGIOGRAM;  Surgeon: Jettie Booze, MD;  Location: [facility] CATH LAB;  Service: Cardiovascular;  Laterality: N/A;   PERCUTANEOUS CORONARY STENT INTERVENTION (PCI-S)  09/30/2011   Procedure: PERCUTANEOUS CORONARY STENT INTERVENTION (PCI-S);  Surgeon: Jettie Booze, MD;  Location[facility] CATH LAB;  Service: Cardiovascular;;   PERCUTANEOUS CORONARY STENT INTERVENTION (PCI-S) N/A 11/11/2011   Procedure: PERCUTANEOUS CORONARY STENT INTERVENTION (PCI-S);  Surgeon: Jettie Booze, MD;  Location: [facility] CATH LAB;   Service: Cardiovascular;  Laterality: N/A;   PERIPHERAL VASCULAR BALLOON ANGIOPLASTY Right 08/08/2020   Procedure: PERIPHERAL VASCULAR BALLOON ANGIOPLASTY;  Surgeon: Angelia Mould, MD;  Location: [place] CV LAB;  Service: Cardiovascular;  Laterality: Right;  Posterior tibial    SHOULDER SURGERY Right    X 2   STUMP REVISION Right 01/09/2021   Procedure: REVISION RIGHT BELOW KNEE AMPUTATION;  Surgeon: [name], MD;  Location: [place];  Service: Orthopedics;  Laterality: Right;   TONSILLECTOMY  ~ Keith Espinoza Right 04/13/2019   Procedure: RIGHT TOTAL HIP REVISION-POSTERIOR  APPROACH LATERAL;  Surgeon: Marybelle Killings, MD;  Location: [place];  Service: Orthopedics;  Laterality: Right;   TOTAL KNEE ARTHROPLASTY Left 07/23/2016   Procedure: LEFT TOTAL KNEE ARTHROPLASTY;  Surgeon: Marybelle Killings, MD;  Location: [place];  Service: Orthopedics;  Laterality: Left;     Current Outpatient Medications  Medication Sig Dispense Refill   acetaminophen (TYLENOL) 500 MG tablet Take 1,000 mg by mouth every 6 (six) hours as needed (pain).     allopurinol (ZYLOPRIM) 300 MG tablet Take 300 mg by mouth daily.     amLODipine (NORVASC) 5 MG tablet Take 1 tablet (5 mg total) by mouth daily. 30 tablet 0   aspirin EC 81 MG tablet Take 81 mg by mouth daily.     Cholecalciferol (VITAMIN D3) 50 MCG (2000 UT) TABS Take 2,000 Units by mouth daily.      clopidogrel (PLAVIX) 75 MG tablet Take 1 tablet (75 mg total) by mouth daily. 30 tablet 0   Cyanocobalamin (B-12) 2500 MCG TABS Take 2,500 mcg by mouth daily.     empagliflozin (JARDIANCE) 25 MG TABS tablet Take 1 tablet (25 mg total) by mouth daily. 90 tablet 3   gabapentin (NEURONTIN) 100 MG capsule Take 2 capsules (200 mg total) by mouth at bedtime. (Patient taking differently: Take 600 mg by mouth at bedtime.) 30 capsule 0   gabapentin (NEURONTIN) 400 MG capsule Take 1 capsule (400 mg total) by mouth 3 (three) times daily. (Patient taking  differently: Take 400 mg by mouth daily.) 90 capsule 0   hydrochlorothiazide (HYDRODIURIL) 12.5 MG tablet Take 1 tablet (12.5 mg total) by mouth daily. 30 tablet 0   insulin degludec (TRESIBA FLEXTOUCH) 100 UNIT/ML FlexTouch Pen Inject 10 Units into the skin daily. 15 mL 3   Insulin Pen Needle 32G X 4 MM MISC 1 Device by Does not apply route daily in the afternoon. 100 each 3   isosorbide mononitrate (IMDUR) 30  MG 24 hr tablet Take 1 tablet (30 mg total) by mouth daily. Please schedule yearly appointment for future refills. Thank you 30 tablet 0   losartan (COZAAR) 50 MG tablet Take 1 tablet (50 mg total) by mouth daily. 30 tablet 0   metFORMIN (GLUCOPHAGE) 1000 MG tablet Take 1 tablet (1,000 mg total) by mouth 2 (two) times daily. 180 tablet 3   nitroGLYCERIN (NITROSTAT) 0.4 MG SL tablet Place 0.4 mg under the tongue every 5 (five) minutes x 3 doses as needed for chest pain.      ONE TOUCH ULTRA TEST test strip CHECK BLOOD SUGAR ONCE DAILY AS DIRECTED  5   pioglitazone (ACTOS) 30 MG tablet Take 1 tablet (30 mg total) by mouth daily. 90 tablet 3   polyethylene glycol (MIRALAX) 17 g packet Take 17 g by mouth 2 (two) times daily. 28 each 3   potassium chloride SA (KLOR-CON) 20 MEQ tablet Take 1 tablet (20 mEq total) by mouth daily. 20 tablet 0   senna-docusate (SENOKOT-S) 8.6-50 MG tablet Take 1 tablet by mouth at bedtime. 30 tablet 2   terazosin (HYTRIN) 5 MG capsule Take 1 capsule (5 mg total) by mouth at bedtime. 30 capsule 0   traMADol (ULTRAM) 50 MG tablet Take 1 tablet (50 mg total) by mouth every 6 (six) hours as needed for moderate pain. (Patient not taking: Reported on 04/15/2022) 28 tablet 0   traZODone (DESYREL) 50 MG tablet Take 1 tablet (50 mg total) by mouth at bedtime as needed for sleep. (Patient not taking: Reported on 04/15/2022)     No current facility-administered medications for this visit.    Allergies:   Simvastatin, Zetia [ezetimibe], and Dilaudid [hydromorphone hcl]     Social History:  The patient  reports that he has quit smoking. His smokeless tobacco use includes chew. He reports that he does not drink alcohol and does not use drugs.   Family History:  The patient's family history includes Cancer in his brother and father; Diabetes in his mother and sister; Hyperlipidemia in his mother; Hypertension in his father, mother, and sister.    ROS:  Please see the history of present illness.   Otherwise, review of systems are positive for some instablility.   All other systems are reviewed and negative.    PHYSICAL EXAM: VS:  BP 120/70   Pulse 86   Ht '5\' 10"'$  (1.778 m)   Wt 154 lb 9.6 oz (70.1 kg)   SpO2 99%   BMI 22.18 kg/m  , BMI Body mass index is 22.18 kg/m. GEN: Well nourished, well developed, in no acute distress HEENT: normal Neck: no JVD, carotid bruits, or masses Cardiac: RRR; no murmurs, rubs, or gallops,no edema  Respiratory:  clear to auscultation bilaterally, normal work of breathing GI: soft, nontender, nondistended, + BS MS: right leg amputation Skin: warm and dry, no rash Neuro:  Strength and sensation are intact Psych: euthymic mood, full affect   EKG:   The ekg ordered today demonstrates NSR, nonspecific ST changes.  No change from prior   Recent Labs: No results found for requested labs within last 365 days.   Lipid Panel    Component Value Date/Time   CHOL 117 04/06/2014 0230   TRIG 123 04/06/2014 0230   HDL 34 (L) 04/06/2014 0230   CHOLHDL 3.4 04/06/2014 0230   VLDL 25 04/06/2014 0230   LDLCALC 58 04/06/2014 0230     Other studies Reviewed: Additional studies/ records that were reviewed today with  results demonstrating: Labs reviewed.   ASSESSMENT AND PLAN:  CAD: No angina on medical therapy.  Continue aggressive secondary prevention. DM: A1c 8.5 in May 2023.  Whole food, plant-based diet will be helpful.  Increased activity that can be done safely will also be helpful. HTN: Given some low blood pressure  readings, will decrease amlodipine to 2.5 mg daily. Hyperlipidemia: LDL 69.  Continue lipid-lowering therapy.   Current medicines are reviewed at length with the patient today.  The patient concerns regarding his medicines were addressed.  The following changes have been made:  No change  Labs/ tests ordered today include:  No orders of the defined types were placed in this encounter.   Recommend 150 minutes/week of aerobic exercise Low fat, low carb, high fiber diet recommended  Disposition:   FU in 1 year   Signed, Larae Grooms, MD  04/15/2022 9:44 AM    [facility] [place], [place],   [address] Phone: [phone]; Fax: [phone]

## 2022-04-15 ENCOUNTER — Encounter: Payer: Self-pay | Admitting: Interventional Cardiology

## 2022-04-15 ENCOUNTER — Ambulatory Visit (INDEPENDENT_AMBULATORY_CARE_PROVIDER_SITE_OTHER): Payer: Medicare Other | Admitting: Interventional Cardiology

## 2022-04-15 VITALS — BP 120/70 | HR 86 | Ht 70.0 in | Wt 154.6 lb

## 2022-04-15 DIAGNOSIS — I25118 Atherosclerotic heart disease of native coronary artery with other forms of angina pectoris: Secondary | ICD-10-CM

## 2022-04-15 DIAGNOSIS — I739 Peripheral vascular disease, unspecified: Secondary | ICD-10-CM

## 2022-04-15 DIAGNOSIS — E782 Mixed hyperlipidemia: Secondary | ICD-10-CM

## 2022-04-15 DIAGNOSIS — I10 Essential (primary) hypertension: Secondary | ICD-10-CM | POA: Diagnosis not present

## 2022-04-15 MED ORDER — AMLODIPINE BESYLATE 2.5 MG PO TABS: 2.5000 mg | ORAL_TABLET | Freq: Every day | ORAL | 3 refills | Status: DC

## 2022-04-15 NOTE — Patient Instructions (Signed)
Medication Instructions:  Your physician has recommended you make the following change in your medication: Decrease amlodipine to 2.5 mg by mouth daily  *If you need a refill on your cardiac medications before your next appointment, please call your pharmacy*   Lab Work: none If you have labs (blood work) drawn today and your tests are completely normal, you will receive your results only by: [place] (if you have MyChart) OR A paper copy in the mail If you have any lab test that is abnormal or we need to change your treatment, we will call you to review the results.   Testing/Procedures: none   Follow-Up: At [facility], you and your health needs are our priority.  As part of our continuing mission to provide you with exceptional heart care, we have created designated Provider Care Teams.  These Care Teams include your primary Cardiologist (physician) and Advanced Practice Providers (APPs -  Physician Assistants and Nurse Practitioners) who all work together to provide you with the care you need, when you need it.  We recommend signing up for the patient portal called "MyChart".  Sign up information is provided on this After Visit Summary.  MyChart is used to connect with patients for Virtual Visits (Telemedicine).  Patients are able to view lab/test results, encounter notes, upcoming appointments, etc.  Non-urgent messages can be sent to your provider as well.   To learn more about what you can do with MyChart, go to [email or web address].    Your next appointment:   12 month(s)  The format for your next appointment:   In Person  Provider:   Larae Grooms, MD     Other Instructions    Important Information About Sugar

## 2022-04-19 ENCOUNTER — Other Ambulatory Visit: Payer: Self-pay

## 2022-04-19 DIAGNOSIS — E1151 Type 2 diabetes mellitus with diabetic peripheral angiopathy without gangrene: Secondary | ICD-10-CM

## 2022-04-19 DIAGNOSIS — I10 Essential (primary) hypertension: Secondary | ICD-10-CM

## 2022-04-20 ENCOUNTER — Telehealth: Payer: Self-pay | Admitting: *Deleted

## 2022-04-20 NOTE — Chronic Care Management (AMB) (Signed)
  Care Coordination  Note  04/20/2022 Name: Keith Espinoza MRN: [id number] DOB: 06-Mar-1941  Keith Espinoza is a 81 y.o. year old male who is a primary care patient of [name], MD. I reached out to [name] by phone today to offer care coordination services.      [name] was given information about Care Coordination services today including:  The Care Coordination services include support from the care team which includes your Nurse Coordinator, Clinical Social Worker, or Pharmacist.  The Care Coordination team is here to help remove barriers to the health concerns and goals most important to you. Care Coordination services are voluntary and the patient may decline or stop services at any time by request to their care team member.   Patient agreed to services and verbal consent obtained.   Follow up plan: Telephone appointment with care coordination team member scheduled for: 04/21/2022  Julian Hy, [place] Direct Dial: [phone]

## 2022-04-21 ENCOUNTER — Telehealth: Payer: Self-pay

## 2022-04-21 ENCOUNTER — Ambulatory Visit: Payer: Self-pay

## 2022-04-21 NOTE — Telephone Encounter (Signed)
Patient states that his blood pressure has been dropping low. Patient states the Actos can cause the pressure to drop. Would like to know if he needs to stop the Amlodipine.

## 2022-04-21 NOTE — Telephone Encounter (Signed)
Left vm for patient to call back.

## 2022-04-21 NOTE — Telephone Encounter (Signed)
Patient states that he stopped they blood pressure medication and now his pressure is fine

## 2022-04-21 NOTE — Patient Instructions (Addendum)
Visit Information  Thank you for allowing the Care Coordination team to participate in your care.  Please don't hesitate to call if you require assistance prior to our next telephonic outreach.  Following are the goals we discussed today:   Goals Addressed             This Visit's Progress    Regulate Blood Pressure       Care Coordination Interventions: Reviewed medications and plan for blood pressure management. Provided information regarding established blood pressure parameters along with indications for notifying a provider. Advised to monitor and record readings. Reports doing well today however he experienced dizziness within the last week d/t his systolic reading dropping to 95. Reviewed the Cardiology's team previous recommendation to decrease amlodipine to 2.'5mg'$ . Reports being unsure if he is taking the medication correctly. He will confirm with his daughter who prepares his medications. Reviewed indications for seeking immediate medical attention.          Our next appointment is by telephone on April 28, 2022 at 0930.  Please call the care guide team at [phone] if you need to cancel or reschedule your appointment.   The patient verbalized understanding of instructions, educational materials, and care plan provided today and DECLINED offer to receive copy of patient instructions, educational materials, and care plan.   A member of the care management team will follow up next week.   [facility] [phone]

## 2022-04-21 NOTE — Patient Outreach (Signed)
  Care Coordination   Initial Visit Note   04/21/2022 Name: WARIS RODGER MRN: [id number] DOB: 1940/11/18  FITZPATRICK ALBERICO is a 81 y.o. year old male who sees [name], MD for primary care. I spoke with  [name] by phone today.  What matters to the patients health and wellness today?  Manage Blood Pressure  Goals Addressed             This Visit's Progress    Regulate Blood Pressure       Care Coordination Interventions: Reviewed medications and plan for blood pressure management. Provided information regarding established blood pressure parameters along with indications for notifying a provider. Advised to monitor and record readings. Reports doing well today however he experienced dizziness within the last week d/t his systolic reading dropping to 95. Reviewed the Cardiology's team previous recommendation to decrease amlodipine to 2.'5mg'$ . Reports being unsure if he is taking the medication correctly. He will confirm with his daughter who prepares his medications. Reviewed indications for seeking immediate medical attention.        SDOH assessments and interventions completed:   Yes SDOH Interventions Today    Flowsheet Row Most Recent Value  SDOH Interventions   Food Insecurity Interventions Intervention Not Indicated  Transportation Interventions Intervention Not Indicated       Care Coordination Interventions Activated:  Yes Care Coordination Interventions:   Yes, provided  Follow up plan: Follow up call scheduled for April 28, 2022 at 0930.  Encounter Outcome:  Pt. Visit Completed  [facility] [phone]

## 2022-04-29 DIAGNOSIS — H40033 Anatomical narrow angle, bilateral: Secondary | ICD-10-CM | POA: Diagnosis not present

## 2022-04-29 DIAGNOSIS — H2513 Age-related nuclear cataract, bilateral: Secondary | ICD-10-CM | POA: Diagnosis not present

## 2022-05-03 ENCOUNTER — Ambulatory Visit (INDEPENDENT_AMBULATORY_CARE_PROVIDER_SITE_OTHER): Payer: Medicare Other | Admitting: Orthopedic Surgery

## 2022-05-03 DIAGNOSIS — L97911 Non-pressure chronic ulcer of unspecified part of right lower leg limited to breakdown of skin: Secondary | ICD-10-CM

## 2022-05-03 DIAGNOSIS — I25118 Atherosclerotic heart disease of native coronary artery with other forms of angina pectoris: Secondary | ICD-10-CM | POA: Diagnosis not present

## 2022-05-03 DIAGNOSIS — Z89511 Acquired absence of right leg below knee: Secondary | ICD-10-CM | POA: Diagnosis not present

## 2022-05-04 ENCOUNTER — Encounter: Payer: Self-pay | Admitting: Orthopedic Surgery

## 2022-05-04 NOTE — Progress Notes (Signed)
Office Visit Note   Patient: Keith Espinoza           Date of Birth: Apr 12, 1941           MRN: [id number] Visit Date: 05/03/2022              Requested by: [name], MD [address] [place],  [place] [address] PCP: [name], MD  Chief Complaint  Patient presents with   Right Leg - Wound Check      HPI: Patient is a 81 year old gentleman status post right transtibial amputation who has had ulceration on the right residual limb secondary to a poor fitting prosthesis.  Patient has had the prostatic socket modified and feels like he has developed some new ulcers medially and laterally.  Assessment & Plan: Visit Diagnoses:  1. Right below-knee amputee ([place])   2. Ulcer of right leg, limited to breakdown of skin ([place])     Plan: Continue with the current socket increase time in the socket as tolerated reevaluate in 4 weeks.  Follow-Up Instructions: Return in about 4 weeks (around 05/31/2022).   Ortho Exam  Patient is alert, oriented, no adenopathy, well-dressed, normal affect, normal respiratory effort. Examination the ulcer over the tibial tubercle and medial tibial plateau has almost completely healed.  Patient has a few areas of skin shearing from the new pads medially and laterally that have offloaded the tibial tubercle and medial tibial plateau.  These areas should resolve as patient's residual limb becomes accustomed to the new pressure points.  Imaging: No results found. No images are attached to the encounter.  Labs: Lab Results  Component Value Date   HGBA1C 8.3 (A) 10/06/2021   HGBA1C 7.5 (A) 06/30/2021   HGBA1C 7.5 (H) 02/01/2021   REPTSTATUS 04/18/2019 FINAL 04/13/2019   GRAMSTAIN  04/13/2019    RARE WBC PRESENT, PREDOMINANTLY PMN NO ORGANISMS SEEN    CULT  04/13/2019    No growth aerobically or anaerobically. Performed at [facility], [place] [address], [place], [place] [address]      Lab Results  Component Value Date    ALBUMIN 3.5 01/31/2021   ALBUMIN 2.9 (L) 12/17/2020   ALBUMIN 4.4 04/10/2019    Lab Results  Component Value Date   MG 2.7 (H) 02/01/2021   No results found for: "VD25OH"  No results found for: "PREALBUMIN"    Latest Ref Rng & Units 02/03/2021    3:13 AM 02/01/2021    6:25 AM 01/31/2021    7:15 PM  CBC EXTENDED  WBC 4.0 - 10.5 K/uL 6.2  9.3  11.6   RBC 4.22 - 5.81 MIL/uL 3.36  3.59  3.88   Hemoglobin 13.0 - 17.0 g/dL 9.9  10.6  11.2   HCT 39.0 - 52.0 % 28.9  32.1  35.9   Platelets 150 - 400 K/uL 152  171  187      There is no height or weight on file to calculate BMI.  Orders:  No orders of the defined types were placed in this encounter.  No orders of the defined types were placed in this encounter.    Procedures: No procedures performed  Clinical Data: No additional findings.  ROS:  All other systems negative, except as noted in the HPI. Review of Systems  Objective: Vital Signs: There were no vitals taken for this visit.  Specialty Comments:  No specialty comments available.  PMFS History: Patient Active Problem List   Diagnosis Date Noted  Type 2 diabetes mellitus with hyperglycemia, with long-term current use of insulin ([place]) 02/15/2022   Adenoma of left adrenal gland 02/15/2022   History of right below knee amputation ([place]) 10/06/2021   Exudative age-related macular degeneration of left eye with active choroidal neovascularization ([place]) 07/28/2021   Exudative age-related macular degeneration of right eye with active choroidal neovascularization ([place]) 06/01/2021   Intermediate stage nonexudative age-related macular degeneration of both eyes 06/01/2021   Type 2 diabetes mellitus with diabetic polyneuropathy, with long-term current use of insulin ([place]) 03/24/2021   Hypokalemia    Constipation 02/02/2021   Fecal impaction ([place]) 02/01/2021   Wound dehiscence 01/09/2021   Dehiscence of amputation stump ([place])    Status post percutaneous transluminal  coronary angioplasty 01/06/2021   Type II diabetes mellitus, uncontrolled 01/06/2021   Acute pancreatitis 01/06/2021   Diabetic peripheral vascular disease ([place]) 01/06/2021   Encounter for screening for other disorder 01/06/2021   Enlarged prostate 01/06/2021   Foot ulcer, right ([place]) 01/06/2021   Gout 01/06/2021   Headache 01/06/2021   Neck pain 01/06/2021   Hypoglycemia 01/06/2021   Loss of appetite 01/06/2021   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   Peripheral neuropathy 01/06/2021   Sciatica 01/06/2021   Vitamin B12 deficiency 01/06/2021   Vitamin D deficiency 01/06/2021   Weakness 01/06/2021   Diabetes mellitus type 2 with neurological manifestations ([place]) 01/06/2021   Protein-calorie malnutrition, severe 12/26/2020   Acute blood loss anemia 12/26/2020   Prerenal azotemia 12/26/2020   Below-knee amputation of right lower extremity ([place]) 12/12/2020   Gangrene of right foot ([place])    Diabetic neuropathy ([place]) 11/17/2020   Hyperglycemia due to type 2 diabetes mellitus ([place]) 11/17/2020   Long term (current) use of insulin ([place]) 11/17/2020   Obesity 11/17/2020   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/2019   Hip dislocation, right ([place]) 04/13/2019   Other intervertebral disc degeneration, lumbar region 03/30/2019   CAD (coronary artery disease) 01/30/2019   Tobacco abuse 01/30/2019   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   Burn, foot, second degree, left, initial encounter 06/08/2017   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 03/16/2017   S/P total knee arthroplasty, left 10/26/2016   Hyperlipidemia 09/04/2014   Thrombocytopenia (H[facility])    Precordial chest pain 04/05/2014   Coronary atherosclerosis of native coronary artery 10/01/2013   Other and unspecified hyperlipidemia 10/01/2013   Primary hypertension 10/01/2013   Diabetes mellitus ([place]) 10/01/2013   Esophageal reflux 10/01/2013   Hypertrophy of prostate without urinary obstruction and other lower  urinary tract symptoms (LUTS) 10/01/2013   Past Medical History:  Diagnosis Date   Allergic rhinitis    Allergic rhinitis    Arthritis    Basal cell carcinoma 11/01/2019    bcc left chest treatment TX [id number] 84f    Chronic leg pain    right   Chronic lower back pain    Coronary artery disease    a. Stenting to RCA 2004; staged DES to LAD and Cx 2004. DES to mRCA 2012. b. DES to mCx, PTCA to dCx 11/2011. c. Lateral wall MI 2013 s/p PTCA to distal Cx & DES to mid OM2 11/2011. d. Low risk nuc 04/2014, EF wnl.   COVID-19    Diabetes mellitus    Insulin dependent   Diabetic neuropathy ([place]    MILD   Diverticulosis    Dysrhythmia    GRosanna Randysyndrome    Gout    right wrist; right foot; right elbow; have had it since 1970's  H/O hiatal hernia    Heart murmur    History of echocardiogram    aortic sclerosis per echo 12/09 EF 65%, otherwise normal   History of hemorrhoids    BLEEDING   History of kidney stones    h/o   Hypertension    Diagnosed 1995    Myocardial infarction [facility])    Pancreatic pseudocyst    a. s/p remote drainage 2006.   Thrombocytopenia ([place])    Seen on oldest labs in system from 2004   Vitamin B 12 deficiency    orally replaced    Family History  Problem Relation Age of Onset   Diabetes Mother    Hyperlipidemia Mother    Hypertension Mother    Cancer Father    Hypertension Father    Diabetes Sister    Hypertension Sister    Cancer Brother    Heart attack Neg Hx     Past Surgical History:  Procedure Laterality Date   ABDOMINAL AORTOGRAM W/LOWER EXTREMITY Bilateral 08/08/2020   Procedure: ABDOMINAL AORTOGRAM W/LOWER EXTREMITY;  Surgeon: Angelia Mould, MD;  Location: [facility];  Service: Cardiovascular;  Laterality: Bilateral;   AMPUTATION Right 12/12/2020   Procedure: RIGHT BELOW KNEE AMPUTATION;  Surgeon: [name], MD;  Location: [facility];  Service: Orthopedics;  Laterality: Right;   BACK SURGERY     "total of 3 times" S/P fall     CARPAL TUNNEL RELEASE Bilateral    CHOLECYSTECTOMY  1990's   COLONOSCOPY     CORONARY ANGIOPLASTY  11/11/11   CORONARY ANGIOPLASTY WITH STENT PLACEMENT  09/30/2011   "1 then; makes a total of 4"   CORONARY ANGIOPLASTY WITH STENT PLACEMENT  11/11/11   "1; makes a total of 5"   INGUINAL HERNIA REPAIR  2003   right   JOINT REPLACEMENT Right 04/03/2002   hip replacment   KNEE ARTHROSCOPY  1990's   left   LEFT HEART CATHETERIZATION WITH CORONARY ANGIOGRAM N/A 09/30/2011   Procedure: LEFT HEART CATHETERIZATION WITH CORONARY ANGIOGRAM;  Surgeon: Jettie Booze, MD;  Location: [facility] CATH LAB;  Service: Cardiovascular;  Laterality: N/A;  possible PCI   LEFT HEART CATHETERIZATION WITH CORONARY ANGIOGRAM N/A 11/15/2011   Procedure: LEFT HEART CATHETERIZATION WITH CORONARY ANGIOGRAM;  Surgeon: Jettie Booze, MD;  Location: [facility] CATH LAB;  Service: Cardiovascular;  Laterality: N/A;   PERCUTANEOUS CORONARY STENT INTERVENTION (PCI-S)  09/30/2011   Procedure: PERCUTANEOUS CORONARY STENT INTERVENTION (PCI-S);  Surgeon: Jettie Booze, MD;  Location: [facility] CATH LAB;  Service: Cardiovascular;;   PERCUTANEOUS CORONARY STENT INTERVENTION (PCI-S) N/A 11/11/2011   Procedure: PERCUTANEOUS CORONARY STENT INTERVENTION (PCI-S);  Surgeon: Jettie Booze, MD;  Location: [facility] CATH LAB;  Service: Cardiovascular;  Laterality: N/A;   PERIPHERAL VASCULAR BALLOON ANGIOPLASTY Right 08/08/2020   Procedure: PERIPHERAL VASCULAR BALLOON ANGIOPLASTY;  Surgeon: Angelia Mould, MD;  Location: [facility];  Service: Cardiovascular;  Laterality: Right;  Posterior tibial    SHOULDER SURGERY Right    X 2   STUMP REVISION Right 01/09/2021   Procedure: REVISION RIGHT BELOW KNEE AMPUTATION;  Surgeon: [name], MD;  Location: [facility];  Service: Orthopedics;  Laterality: Right;   TONSILLECTOMY  ~ [place] Right 04/13/2019   Procedure: RIGHT TOTAL HIP REVISION-POSTERIOR  APPROACH LATERAL;  Surgeon:  Marybelle Killings, MD;  Location: [facility];  Service: Orthopedics;  Laterality: Right;   TOTAL KNEE ARTHROPLASTY Left 07/23/2016   Procedure: LEFT TOTAL KNEE  ARTHROPLASTY;  Surgeon: Marybelle Killings, MD;  Location: [facility];  Service: Orthopedics;  Laterality: Left;   Social History   Occupational History   Occupation: Retired  Tobacco Use   Smoking status: Former   Smokeless tobacco: Current    Types: Chew   Tobacco comments:    quit 60 years ago  Vaping Use   Vaping Use: Never used  Substance and Sexual Activity   Alcohol use: No   Drug use: No   Sexual activity: Not Currently

## 2022-05-07 ENCOUNTER — Ambulatory Visit: Payer: Self-pay

## 2022-05-07 NOTE — Patient Outreach (Signed)
  Care Coordination   05/07/2022 Name: Keith Espinoza MRN: [id number] DOB: 1941-08-17   Care Coordination Outreach Attempts:  An unsuccessful telephone outreach was attempted today to offer the patient information about available care coordination services as a benefit of their health plan.   Follow Up Plan:  Additional outreach attempts will be made to offer the patient care coordination information and services.   Encounter Outcome:  No Answer  Care Coordination Interventions Activated:  No   Care Coordination Interventions:  No, not indicated      [facility] [phone]

## 2022-05-07 NOTE — Patient Outreach (Signed)
  Care Coordination   Follow Up Visit Note   05/07/2022 Name: Keith Espinoza MRN: [id number] DOB: 08/02/1941  Keith Espinoza is a 81 y.o. year old male who sees [name], MD for primary care. I spoke with  [name] by phone today  What matters to the patients health and wellness today?  Blood Pressure Management  Goals Addressed             This Visit's Progress    Regulate Blood Pressure       Care Coordination Interventions: Reviewed medications and plan for blood pressure management. Provided information regarding established blood pressure parameters along with indications for notifying a provider. Advised to monitor and record readings. Reports doing well today however he experienced dizziness within the last week d/t his systolic reading dropping to 95. Reviewed the Cardiology's team previous recommendation to decrease amlodipine to 2.'5mg'$ . Reports being unsure if he is taking the medication correctly. He will confirm with his daughter who prepares his medications. Update 05/07/22: Reports BP continues to fluctuate but readings have improved. Reports BP was 129/61 today. Reports feeling well over the past few days. Denies episodes of dizziness since last outreach.  Advised to continue monitoring and recording readings. Reviewed indications for seeking immediate medical attention.          SDOH assessments and interventions completed:  No     Care Coordination Interventions Activated:  Yes  Care Coordination Interventions:  Yes, provided   Follow up plan: Will follow up within the next month.  Encounter Outcome:  Pt. Visit Completed   [facility] [phone]

## 2022-05-24 ENCOUNTER — Encounter (INDEPENDENT_AMBULATORY_CARE_PROVIDER_SITE_OTHER): Payer: Medicare Other | Admitting: Ophthalmology

## 2022-05-26 ENCOUNTER — Encounter (INDEPENDENT_AMBULATORY_CARE_PROVIDER_SITE_OTHER): Payer: Medicare Other | Admitting: Ophthalmology

## 2022-05-31 ENCOUNTER — Ambulatory Visit (INDEPENDENT_AMBULATORY_CARE_PROVIDER_SITE_OTHER): Payer: Medicare Other

## 2022-05-31 ENCOUNTER — Encounter (INDEPENDENT_AMBULATORY_CARE_PROVIDER_SITE_OTHER): Payer: Self-pay | Admitting: Ophthalmology

## 2022-05-31 ENCOUNTER — Ambulatory Visit (INDEPENDENT_AMBULATORY_CARE_PROVIDER_SITE_OTHER): Payer: Medicare Other | Admitting: Orthopedic Surgery

## 2022-05-31 ENCOUNTER — Encounter: Payer: Self-pay | Admitting: Orthopedic Surgery

## 2022-05-31 ENCOUNTER — Ambulatory Visit (INDEPENDENT_AMBULATORY_CARE_PROVIDER_SITE_OTHER): Payer: Medicare Other | Admitting: Ophthalmology

## 2022-05-31 DIAGNOSIS — E1165 Type 2 diabetes mellitus with hyperglycemia: Secondary | ICD-10-CM | POA: Diagnosis not present

## 2022-05-31 DIAGNOSIS — Z89511 Acquired absence of right leg below knee: Secondary | ICD-10-CM

## 2022-05-31 DIAGNOSIS — M25561 Pain in right knee: Secondary | ICD-10-CM | POA: Diagnosis not present

## 2022-05-31 DIAGNOSIS — H353221 Exudative age-related macular degeneration, left eye, with active choroidal neovascularization: Secondary | ICD-10-CM

## 2022-05-31 DIAGNOSIS — H353211 Exudative age-related macular degeneration, right eye, with active choroidal neovascularization: Secondary | ICD-10-CM

## 2022-05-31 DIAGNOSIS — Z794 Long term (current) use of insulin: Secondary | ICD-10-CM | POA: Diagnosis not present

## 2022-05-31 DIAGNOSIS — I25118 Atherosclerotic heart disease of native coronary artery with other forms of angina pectoris: Secondary | ICD-10-CM

## 2022-05-31 MED ORDER — BEVACIZUMAB CHEMO INJECTION 1.25MG/0.05ML SYRINGE FOR KALEIDOSCOPE
1.2500 mg | INTRAVITREAL | Status: AC | PRN
  Administered 2022-05-31: 1.25 mg via INTRAVITREAL

## 2022-05-31 NOTE — Assessment & Plan Note (Signed)
No detectable diabetic retinopathy 

## 2022-05-31 NOTE — Progress Notes (Signed)
05/31/2022     CHIEF COMPLAINT Patient presents for  Chief Complaint  Patient presents with   Macular Degeneration      HISTORY OF PRESENT ILLNESS: Keith Espinoza is a 81 y.o. male who presents to the clinic today for:   HPI   7 weeks dilate ou avastin oct OD OS  Pt states his vision has been stable Pt denies any new floaters or FOL  Last edited by Morene Rankins, CMA on 05/31/2022  9:43 AM.      Referring physician: [name], MD [address] [place],  [place] [address]  HISTORICAL INFORMATION:   Selected notes from the MEDICAL RECORD NUMBER    Lab Results  Component Value Date   HGBA1C 8.3 (A) 10/06/2021     CURRENT MEDICATIONS: No current outpatient medications on file. (Ophthalmic Drugs)   No current facility-administered medications for this visit. (Ophthalmic Drugs)   Current Outpatient Medications (Other)  Medication Sig   acetaminophen (TYLENOL) 500 MG tablet Take 1,000 mg by mouth every 6 (six) hours as needed (pain).   allopurinol (ZYLOPRIM) 300 MG tablet Take 300 mg by mouth daily.   amLODipine (NORVASC) 2.5 MG tablet Take 1 tablet (2.5 mg total) by mouth daily.   aspirin EC 81 MG tablet Take 81 mg by mouth daily.   Cholecalciferol (VITAMIN D3) 50 MCG (2000 UT) TABS Take 2,000 Units by mouth daily.    clopidogrel (PLAVIX) 75 MG tablet Take 1 tablet (75 mg total) by mouth daily.   Cyanocobalamin (B-12) 2500 MCG TABS Take 2,500 mcg by mouth daily.   empagliflozin (JARDIANCE) 25 MG TABS tablet Take 1 tablet (25 mg total) by mouth daily.   gabapentin (NEURONTIN) 100 MG capsule Take 2 capsules (200 mg total) by mouth at bedtime. (Patient taking differently: Take 600 mg by mouth at bedtime.)   gabapentin (NEURONTIN) 400 MG capsule Take 1 capsule (400 mg total) by mouth 3 (three) times daily. (Patient taking differently: Take 400 mg by mouth daily.)   hydrochlorothiazide (HYDRODIURIL) 12.5 MG tablet Take 1 tablet (12.5 mg total) by mouth  daily.   insulin degludec (TRESIBA FLEXTOUCH) 100 UNIT/ML FlexTouch Pen Inject 10 Units into the skin daily.   Insulin Pen Needle 32G X 4 MM MISC 1 Device by Does not apply route daily in the afternoon.   isosorbide mononitrate (IMDUR) 30 MG 24 hr tablet Take 1 tablet (30 mg total) by mouth daily. Please schedule yearly appointment for future refills. Thank you   losartan (COZAAR) 50 MG tablet Take 1 tablet (50 mg total) by mouth daily.   metFORMIN (GLUCOPHAGE) 1000 MG tablet Take 1 tablet (1,000 mg total) by mouth 2 (two) times daily.   nitroGLYCERIN (NITROSTAT) 0.4 MG SL tablet Place 0.4 mg under the tongue every 5 (five) minutes x 3 doses as needed for chest pain.    ONE TOUCH ULTRA TEST test strip CHECK BLOOD SUGAR ONCE DAILY AS DIRECTED   pioglitazone (ACTOS) 30 MG tablet Take 1 tablet (30 mg total) by mouth daily.   polyethylene glycol (MIRALAX) 17 g packet Take 17 g by mouth 2 (two) times daily.   potassium chloride SA (KLOR-CON) 20 MEQ tablet Take 1 tablet (20 mEq total) by mouth daily.   senna-docusate (SENOKOT-S) 8.6-50 MG tablet Take 1 tablet by mouth at bedtime.   terazosin (HYTRIN) 5 MG capsule Take 1 capsule (5 mg total) by mouth at bedtime.   traMADol (ULTRAM) 50 MG tablet Take 1 tablet (  50 mg total) by mouth every 6 (six) hours as needed for moderate pain. (Patient not taking: Reported on 04/15/2022)   traZODone (DESYREL) 50 MG tablet Take 1 tablet (50 mg total) by mouth at bedtime as needed for sleep. (Patient not taking: Reported on 04/15/2022)   No current facility-administered medications for this visit. (Other)      REVIEW OF SYSTEMS: ROS   Negative for: Constitutional, Gastrointestinal, Neurological, Skin, Genitourinary, Musculoskeletal, HENT, Endocrine, Cardiovascular, Eyes, Respiratory, Psychiatric, Allergic/Imm, Heme/Lymph Last edited by Morene Rankins, CMA on 05/31/2022  9:43 AM.       ALLERGIES Allergies  Allergen Reactions   Simvastatin Other (See Comments)     SEVERE MYALGIAS    Zetia [Ezetimibe] Other (See Comments)    MYALGIAS   Dilaudid [Hydromorphone Hcl] Other (See Comments)    hallucination    PAST MEDICAL HISTORY Past Medical History:  Diagnosis Date   Allergic rhinitis    Allergic rhinitis    Arthritis    Basal cell carcinoma 11/01/2019    bcc left chest treatment TX cx3 82f    Chronic leg pain    right   Chronic lower back pain    Coronary artery disease    a. Stenting to RCA 2004; staged DES to LAD and Cx 2004. DES to mRCA 2012. b. DES to mCx, PTCA to dCx 11/2011. c. Lateral wall MI 2013 s/p PTCA to distal Cx & DES to mid OM2 11/2011. d. Low risk nuc 04/2014, EF wnl.   COVID-19    Diabetes mellitus    Insulin dependent   Diabetic neuropathy ([place]    MILD   Diverticulosis    Dysrhythmia    [place] Randysyndrome    Gout    right wrist; right foot; right elbow; have had it since 1970's   H/O hiatal hernia    Heart murmur    History of echocardiogram    aortic sclerosis per echo 12/09 EF 65%, otherwise normal   History of hemorrhoids    BLEEDING   History of kidney stones    h/o   Hypertension    Diagnosed 1995    Myocardial infarction ([facility]    Pancreatic pseudocyst    a. s/p remote drainage 2006.   [place]rombocytopenia ([place]    Seen on oldest labs in system from 2004   Vitamin B 12 deficiency    orally replaced   Past Surgical History:  Procedure Laterality Date   ABDOMINAL AORTOGRAM W/LOWER EXTREMITY Bilateral 08/08/2020   Procedure: ABDOMINAL AORTOGRAM W/LOWER EXTREMITY;  Surgeon: DAngelia Mould MD;  [facility];  Service: Cardiovascular;  Laterality: Bilateral;   AMPUTATION Right 12/12/2020   Procedure: RIGHT BELOW KNEE AMPUTATION;  Surgeon: DNewt Minion MD;  [facility]  Service: Orthopedics;  Laterality: Right;   BACK SURGERY     "total of 3 times" S/P fall    CARPAL TUNNEL RELEASE Bilateral    CHOLECYSTECTOMY  1990's   COLONOSCOPY     CORONARY ANGIOPLASTY  11/11/11   CORONARY  ANGIOPLASTY WITH STENT PLACEMENT  09/30/2011   "1 then; makes a total of 4"   CORONARY ANGIOPLASTY WITH STENT PLACEMENT  11/11/11   "1; makes a total of 5"   INGUINAL HERNIA REPAIR  2003   right   JOINT REPLACEMENT Right 04/03/2002   hip replacment   KNEE ARTHROSCOPY  1990's   left   LEFT HEART CATHETERIZATION WITH CORONARY ANGIOGRAM N/A 09/30/2011   Procedure: LEFT HEART CATHETERIZATION WITH CORONARY  ANGIOGRAM;  Surgeon: Jettie Booze, MD;  Location: [facility] CATH LAB;  Service: Cardiovascular;  Laterality: N/A;  possible PCI   LEFT HEART CATHETERIZATION WITH CORONARY ANGIOGRAM N/A 11/15/2011   Procedure: LEFT HEART CATHETERIZATION WITH CORONARY ANGIOGRAM;  Surgeon: Jettie Booze, MD;  Location[facility] C[facility] LAB;  Service: Cardiovascular;  Laterality: N/A;   PERCUTANEOUS CORONARY STENT INTERVENTION (PCI-S)  09/30/2011   Procedure: PERCUTANEOUS CORONARY STENT INTERVENTION (PCI-S);  Surgeon: Jettie Booze, MD;  Location: [facility] CATH LAB;  Service: Cardiovascular;;   PERCUTANEOUS CORONARY STENT INTERVENTION (PCI-S) N/A 11/11/2011   Procedure: PERCUTANEOUS CORONARY STENT INTERVENTION (PCI-S);  Surgeon: Jettie Booze, MD;  Location: [facility] CATH LAB;  Service: Cardiovascular;  Laterality: N/A;   PERIPHERAL VASCULAR BALLOON ANGIOPLASTY Right 08/08/2020   Procedure: PERIPHERAL VASCULAR BALLOON ANGIOPLASTY;  Surgeon: Angelia Mould, MD;  Location: [place] [facility] LAB;  Service: Cardiovascular;  Laterality: Right;  Posterior tibial    SHOULDER SURGERY Right    X 2   STUMP REVISION Right 01/09/2021   Procedure: REVISION RIGHT BELOW KNEE AMPUTATION;  Surgeon: [name], MD;  Location: [place];  Service: Orthopedics;  Laterality: Right;   TONSILLECTOMY  ~ [place] Right 04/13/2019   Procedure: RIGHT TOTAL HIP REVISION-POSTERIOR  APPROACH LATERAL;  Surgeon: Marybelle Killings, MD;  Location: [place];  Service: Orthopedics;  Laterality: Right;   TOTAL KNEE ARTHROPLASTY Left 07/23/2016    Procedure: LEFT TOTAL KNEE ARTHROPLASTY;  Surgeon: Marybelle Killings, MD;  Location: [place];  Service: Orthopedics;  Laterality: Left;    FAMILY HISTORY Family History  Problem Relation Age of Onset   Diabetes Mother    Hyperlipidemia Mother    Hypertension Mother    Cancer Father    Hypertension Father    Diabetes Sister    Hypertension Sister    Cancer Brother    Heart attack Neg Hx     SOCIAL HISTORY Social History   Tobacco Use   Smoking status: Former   Smokeless tobacco: Current    Types: Chew   Tobacco comments:    quit 60 years ago  Vaping Use   Vaping Use: Never used  Substance Use Topics   Alcohol use: No   Drug use: No         OPHTHALMIC EXAM:  Base Eye Exam     Visual Acuity (ETDRS)       Right Left   Dist cc 20/25 +3 20/30 -1    Correction: Glasses         Tonometry (Tonopen, 9:47 AM)       Right Left   Pressure 8 9         Pupils       Pupils APD   Right PERRL None   Left PERRL None         Visual Fields       Left Right    Full Full         Extraocular Movement       Right Left    Ortho Ortho    -- -- --  --  --  -- -- --   -- -- --  --  --  -- -- --           Neuro/Psych     Oriented x3: Yes   Mood/Affect: Normal         Dilation     Both eyes: 1.0% Mydriacyl, 2.5% Phenylephrine @ 9:44 AM  Slit Lamp and Fundus Exam     External Exam       Right Left   External Normal Normal         Slit Lamp Exam       Right Left   Lids/Lashes Normal Normal   Conjunctiva/Sclera White and quiet White and quiet   Cornea Clear Clear   Anterior Chamber Deep and quiet Deep and quiet   Iris Round and reactive Round and reactive   Lens Centered posterior chamber intraocular lens Centered posterior chamber intraocular lens   Anterior Vitreous Normal Normal         Fundus Exam       Right Left   Posterior Vitreous Normal Normal   Disc Normal Normal   C/D Ratio 0.25 2.5   Macula Retinal  pigment epithelial mottling, Intermediate age related macular degeneration Retinal pigment epithelial mottling, Intermediate age related macular degeneration   Vessels Normal, no DR Normal, no DR   Periphery Normal Normal            IMAGING AND PROCEDURES  Imaging and Procedures for 05/31/22  OCT, Retina - OU - Both Eyes       Right Eye Quality was borderline. Scan locations included subfoveal. Central Foveal Thickness: 233. Progression has improved. Findings include abnormal foveal contour, intraretinal hyper-reflective material, intraretinal fluid, subretinal scarring, inner retinal atrophy.   Left Eye Quality was poor. Scan locations included subfoveal. Central Foveal Thickness: 233. Progression has improved. Findings include abnormal foveal contour, retinal drusen , intraretinal hyper-reflective material, intraretinal fluid.   Notes OD much vastly improved OD post injection Avastin.  Currently at  8 weeks.  Repeat injection OD today to maintain  OS, macular findings also vastly improved currently at 8 weeks post most recent injection, will observe OS today      Intravitreal Injection, Pharmacologic Agent - OD - Right Eye       Time Out 05/31/2022. 10:17 AM. Confirmed correct patient, procedure, site, and patient consented.   Anesthesia Topical anesthesia was used. Anesthetic medications included Lidocaine 4%.   Procedure Preparation included 5% betadine to ocular surface, 10% betadine to eyelids, Tobramycin 0.3%, Ofloxacin . A 30 gauge needle was used.   Injection: 1.25 mg Bevacizumab 1.'25mg'$ /0.70m   Route: Intravitreal, Site: Right Eye   NDC: [id number] Lot:: [id number] Expiration date: 08/18/2022   Post-op Post injection exam found visual acuity of at least counting fingers. The patient tolerated the procedure well. There were no complications. The patient received written and verbal post procedure care education. Post injection medications included ocuflox.               ASSESSMENT/PLAN:  Exudative age-related macular degeneration of right eye with active choroidal neovascularization (HCC) OD, improving it and stable at 8-week follow-up interval today.  We will repeat injection today and extend interval examination to 10 weeks  Exudative age-related macular degeneration of left eye with active choroidal neovascularization ([place] Patient reports significant symptoms OS and OD with bilateral injection simultaneous.  For this reason we will continue to monitor left eye today.  Reevaluate next in 10 weeks with OD   Type 2 diabetes mellitus with hyperglycemia, with long-term current use of insulin (HCC) No detectable diabetic retinopathy     ICD-10-CM   1. Exudative age-related macular degeneration of right eye with active choroidal neovascularization (HCC)  H35.3211 OCT, Retina - OU - Both Eyes    Intravitreal Injection, Pharmacologic Agent - OD - Right Eye    Bevacizumab (  AVASTIN) SOLN 1.25 mg    2. Exudative age-related macular degeneration of left eye with active choroidal neovascularization ([place])  H35.3221     3. Type 2 diabetes mellitus with hyperglycemia, with long-term current use of insulin (HCC)  E11.65    Z79.4       1.  OD controlled CNVM, stable acuity currently at 8-week interval.  Repeat injection today to maintain  2.  Refuses bilateral injections today because of the difficulty afterwards with vision seeing and recent fall activity.  Thus will observe OS today  3.  No detectable diabetic retinopathy OU  Ophthalmic Meds Ordered this visit:  Meds ordered this encounter  Medications   Bevacizumab (AVASTIN) SOLN 1.25 mg       Return in about 10 weeks (around 08/09/2022) for DILATE OU, AVASTIN OCT, OS,,no plan OD.  There are no Patient Instructions on file for this visit.   Explained the diagnoses, plan, and follow up with the patient and they expressed understanding.  Patient expressed understanding of the importance  of proper follow up care.   Clent Demark Neriah Brott M.D. Diseases & Surgery of the Retina and Vitreous Retina & Diabetic [place] 05/31/22     Abbreviations: M myopia (nearsighted); A astigmatism; H hyperopia (farsighted); P presbyopia; Mrx spectacle prescription;  CTL contact lenses; OD right eye; OS left eye; OU both eyes  XT exotropia; ET esotropia; PEK punctate epithelial keratitis; PEE punctate epithelial erosions; DES dry eye syndrome; MGD meibomian gland dysfunction; ATs artificial tears; PFAT's preservative free artificial tears; [place] nuclear sclerotic cataract; PSC posterior subcapsular cataract; ERM epi-retinal membrane; PVD posterior vitreous detachment; RD retinal detachment; DM diabetes mellitus; DR diabetic retinopathy; NPDR non-proliferative diabetic retinopathy; PDR proliferative diabetic retinopathy; CSME clinically significant macular edema; DME diabetic macular edema; dbh dot blot hemorrhages; CWS cotton wool spot; POAG primary open angle glaucoma; C/D cup-to-disc ratio; HVF humphrey visual field; GVF goldmann visual field; OCT optical coherence tomography; IOP intraocular pressure; BRVO Branch retinal vein occlusion; CRVO central retinal vein occlusion; CRAO central retinal artery occlusion; BRAO branch retinal artery occlusion; RT retinal tear; SB scleral buckle; PPV pars plana vitrectomy; VH Vitreous hemorrhage; PRP panretinal laser photocoagulation; IVK intravitreal kenalog; VMT vitreomacular traction; MH Macular hole;  NVD neovascularization of the disc; NVE neovascularization elsewhere; AREDS age related eye disease study; ARMD age related macular degeneration; POAG primary open angle glaucoma; EBMD epithelial/anterior basement membrane dystrophy; ACIOL anterior chamber intraocular lens; IOL intraocular lens; PCIOL posterior chamber intraocular lens; Phaco/IOL phacoemulsification with intraocular lens placement; [place] photorefractive keratectomy; LASIK laser assisted in situ keratomileusis;  HTN hypertension; DM diabetes mellitus; COPD chronic obstructive pulmonary disease

## 2022-05-31 NOTE — Assessment & Plan Note (Signed)
OD, improving it and stable at 8-week follow-up interval today.  We will repeat injection today and extend interval examination to 10 weeks

## 2022-05-31 NOTE — Assessment & Plan Note (Signed)
Patient reports significant symptoms OS and OD with bilateral injection simultaneous.  For this reason we will continue to monitor left eye today.  Reevaluate next in 10 weeks with OD

## 2022-05-31 NOTE — Progress Notes (Signed)
Office Visit Note   Patient: Keith Espinoza           Date of Birth: 12-Nov-1940           MRN: [id number] Visit Date: 05/31/2022              Requested by: [name], MD [address] [place] [address] [place],  [place] [address] PCP: [name], MD  Chief Complaint  Patient presents with   Right Leg - Follow-up    Hx right BKA revision 01/09/21      HPI: Patient is an 81 year old gentleman who is seen for evaluation for contusion right knee status post transtibial amputation a year and a half ago.  Patient states that he fell last Wednesday slipped on the floor striking his knee.  Patient complains of pain anteriorly over the patella.  Assessment & Plan: Visit Diagnoses:  1. Acute pain of right knee   2. Right below-knee amputee [facility])     Plan: Recommend patient use a prosthetic under liner.  He may increase his activities as tolerated.    Follow-Up Instructions: Return if symptoms worsen or fail to improve.   Ortho Exam  Patient is alert, oriented, no adenopathy, well-dressed, normal affect, normal respiratory effort. Patient has full extension with no quad or patella tendon dysfunction.  There is no knee effusion he has full flexion.  Varus and valgus stresses stable.  No palpable patella defect.  Radiograph shows no fracture.  Imaging: XR Knee 1-2 Views Right  Result Date: 05/31/2022 2 view radiographs of the right knee shows severe calcification of the arteries there is no fracture no displacement of the patella.  Intravitreal Injection, Pharmacologic Agent - OD - Right Eye  Result Date: 05/31/2022 Time Out 05/31/2022. 10:17 AM. Confirmed correct patient, procedure, site, and patient consented. Anesthesia Topical anesthesia was used. Anesthetic medications included Lidocaine 4%. Procedure Preparation included 5% betadine to ocular surface, 10% betadine to eyelids, Tobramycin 0.3%, Ofloxacin . A 30 gauge needle was used. Injection: 1.25 mg Bevacizumab 1.'25mg'$ /0.66m    Route: Intravitreal, Site: Right Eye   NDC: [id number] Lot:: [id number] Expiration date: 08/18/2022 Post-op Post injection exam found visual acuity of at least counting fingers. The patient tolerated the procedure well. There were no complications. The patient received written and verbal post procedure care education. Post injection medications included ocuflox.   OCT, Retina - OU - Both Eyes  Result Date: 05/31/2022 Right Eye Quality was borderline. Scan locations included subfoveal. Central Foveal Thickness: 233. Progression has improved. Findings include abnormal foveal contour, intraretinal hyper-reflective material, intraretinal fluid, subretinal scarring, inner retinal atrophy. Left Eye Quality was poor. Scan locations included subfoveal. Central Foveal Thickness: 233. Progression has improved. Findings include abnormal foveal contour, retinal drusen , intraretinal hyper-reflective material, intraretinal fluid. Notes OD much vastly improved OD post injection Avastin.  Currently at  8 weeks.  Repeat injection OD today to maintain OS, macular findings also vastly improved currently at 8 weeks post most recent injection, will observe OS today  No images are attached to the encounter.  Labs: Lab Results  Component Value Date   HGBA1C 8.3 (A) 10/06/2021   HGBA1C 7.5 (A) 06/30/2021   HGBA1C 7.5 (H) 02/01/2021   REPTSTATUS 04/18/2019 FINAL 04/13/2019   GRAMSTAIN  04/13/2019    RARE WBC PRESENT, PREDOMINANTLY PMN NO ORGANISMS SEEN    CULT  04/13/2019    No growth aerobically or anaerobically. [facility] [address], [place] [address]  Lab Results  Component Value Date   ALBUMIN 3.5 01/31/2021   ALBUMIN 2.9 (L) 12/17/2020   ALBUMIN 4.4 04/10/2019    Lab Results  Component Value Date   MG 2.7 (H) 02/01/2021   No results found for: "VD25OH"  No results found for: "PREALBUMIN"    Latest Ref Rng & Units 02/03/2021    3:13 AM 02/01/2021    6:25 AM  01/31/2021    7:15 PM  CBC EXTENDED  WBC 4.0 - 10.5 K/uL 6.2  9.3  11.6   RBC 4.22 - 5.81 MIL/uL 3.36  3.59  3.88   Hemoglobin 13.0 - 17.0 g/dL 9.9  10.6  11.2   HCT 39.0 - 52.0 % 28.9  32.1  35.9   Platelets 150 - 400 K/uL 152  171  187      There is no height or weight on file to calculate BMI.  Orders:  Orders Placed This Encounter  Procedures   XR Knee 1-2 Views Right   No orders of the defined types were placed in this encounter.    Procedures: No procedures performed  Clinical Data: No additional findings.  ROS:  All other systems negative, except as noted in the HPI. Review of Systems  Objective: Vital Signs: There were no vitals taken for this visit.  Specialty Comments:  No specialty comments available.  PMFS History: Patient Active Problem List   Diagnosis Date Noted   Type 2 diabetes mellitus with hyperglycemia, with long-term current use of insulin ([place]) 02/15/2022   Adenoma of left adrenal gland 02/15/2022   History of right below knee amputation ([place]) 10/06/2021   Exudative age-related macular degeneration of left eye with active choroidal neovascularization ([place]) 07/28/2021   Exudative age-related macular degeneration of right eye with active choroidal neovascularization ([place]) 06/01/2021   Intermediate stage nonexudative age-related macular degeneration of both eyes 06/01/2021   Type 2 diabetes mellitus with diabetic polyneuropathy, with long-term current use of insulin ([place]) 03/24/2021   Hypokalemia    Constipation 02/02/2021   Fecal impaction ([place]) 02/01/2021   Wound dehiscence 01/09/2021   Dehiscence of amputation stump ([place])    Status post percutaneous transluminal coronary angioplasty 01/06/2021   Type II diabetes mellitus, uncontrolled 01/06/2021   Acute pancreatitis 01/06/2021   Diabetic peripheral vascular disease ([place]) 01/06/2021   Encounter for screening for other disorder 01/06/2021   Enlarged prostate 01/06/2021   Foot ulcer, right  ([place]) 01/06/2021   Gout 01/06/2021   Headache 01/06/2021   Neck pain 01/06/2021   Hypoglycemia 01/06/2021   Loss of appetite 01/06/2021   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   Peripheral neuropathy 01/06/2021   Sciatica 01/06/2021   Vitamin B12 deficiency 01/06/2021   Vitamin D deficiency 01/06/2021   Weakness 01/06/2021   Diabetes mellitus type 2 with neurological manifestations ([place]) 01/06/2021   Protein-calorie malnutrition, severe 12/26/2020   Acute blood loss anemia 12/26/2020   Prerenal azotemia 12/26/2020   Below-knee amputation of right lower extremity ([place]) 12/12/2020   Gangrene of right foot ([place])    Diabetic neuropathy ([place]) 11/17/2020   Hyperglycemia due to type 2 diabetes mellitus ([place]) 11/17/2020   Long term (current) use of insulin ([place]) 11/17/2020   Obesity 11/17/2020   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/2019   Hip dislocation, right ([place]) 04/13/2019   Other intervertebral disc degeneration, lumbar region 03/30/2019   CAD (coronary artery disease) 01/30/2019   Tobacco abuse 01/30/2019   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   Burn, foot, second degree, left,  initial encounter 06/08/2017   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 03/16/2017   S/P total knee arthroplasty, left 10/26/2016   Hyperlipidemia 09/04/2014   Thrombocytopenia (HCC)    Precordial chest pain 04/05/2014   Coronary atherosclerosis of native coronary artery 10/01/2013   Other and unspecified hyperlipidemia 10/01/2013   Primary hypertension 10/01/2013   Diabetes mellitus ([place]) 10/01/2013   Esophageal reflux 10/01/2013   Hypertrophy of prostate without urinary obstruction and other lower urinary tract symptoms (LUTS) 10/01/2013   Past Medical History:  Diagnosis Date   Allergic rhinitis    Allergic rhinitis    Arthritis    Basal cell carcinoma 11/01/2019    bcc left chest treatment TX cx3 46f    Chronic leg pain    right   Chronic lower back pain    Coronary  artery disease    a. Stenting to RCA 2004; staged DES to LAD and Cx 2004. DES to mRCA 2012. b. DES to mCx, PTCA to dCx 11/2011. c. Lateral wall MI 2013 s/p PTCA to distal Cx & DES to mid OM2 11/2011. d. Low risk nuc 04/2014, EF wnl.   COVID-19    Diabetes mellitus    Insulin dependent   Diabetic neuropathy ([place]    MILD   Diverticulosis    Dysrhythmia    GRosanna Randysyndrome    Gout    right wrist; right foot; right elbow; have had it since 1970's   H/O hiatal hernia    Heart murmur    History of echocardiogram    aortic sclerosis per echo 12/09 EF 65%, otherwise normal   History of hemorrhoids    BLEEDING   History of kidney stones    h/o   Hypertension    Diagnosed 1995    Myocardial infarction ([facility]    Pancreatic pseudocyst    a. s/p remote drainage 2006.   Thrombocytopenia ([place]    Seen on oldest labs in system from 2004   Vitamin B 12 deficiency    orally replaced    Family History  Problem Relation Age of Onset   Diabetes Mother    Hyperlipidemia Mother    Hypertension Mother    Cancer Father    Hypertension Father    Diabetes Sister    Hypertension Sister    Cancer Brother    Heart attack Neg Hx     Past Surgical History:  Procedure Laterality Date   ABDOMINAL AORTOGRAM W/LOWER EXTREMITY Bilateral 08/08/2020   Procedure: ABDOMINAL AORTOGRAM W/LOWER EXTREMITY;  Surgeon: DAngelia Mould MD;  [facility];  Service: Cardiovascular;  Laterality: Bilateral;   AMPUTATION Right 12/12/2020   Procedure: RIGHT BELOW KNEE AMPUTATION;  Surgeon: DNewt Minion MD;  [place]  Service: Orthopedics;  Laterality: Right;   BACK SURGERY     "total of 3 times" S/P fall    CARPAL TUNNEL RELEASE Bilateral    CHOLECYSTECTOMY  1990's   COLONOSCOPY     CORONARY ANGIOPLASTY  11/11/11   CORONARY ANGIOPLASTY WITH STENT PLACEMENT  09/30/2011   "1 then; makes a total of 4"   CORONARY ANGIOPLASTY WITH STENT PLACEMENT  11/11/11   "1; makes a total of 5"   INGUINAL  HERNIA REPAIR  2003   right   JOINT REPLACEMENT Right 04/03/2002   hip replacment   KNEE ARTHROSCOPY  1990's   left   LEFT HEART CATHETERIZATION WITH CORONARY ANGIOGRAM N/A 09/30/2011   Procedure: LEFT HEART CATHETERIZATION WITH CORONARY ANGIOGRAM;  Surgeon: JConception Oms  Hassell Done, MD;  Location: [facility] CATH LAB;  Service: Cardiovascular;  Laterality: N/A;  possible PCI   LEFT HEART CATHETERIZATION WITH CORONARY ANGIOGRAM N/A 11/15/2011   Procedure: LEFT HEART CATHETERIZATION WITH CORONARY ANGIOGRAM;  Surgeon: Jettie Booze, MD;  Location: [facility] CATH LAB;  Service: Cardiovascular;  Laterality: N/A;   PERCUTANEOUS CORONARY STENT INTERVENTION (PCI-S)  09/30/2011   Procedure: PERCUTANEOUS CORONARY STENT INTERVENTION (PCI-S);  Surgeon: Jettie Booze, MD;  Location: [facility] CATH LAB;  Service: Cardiovascular;;   PERCUTANEOUS CORONARY STENT INTERVENTION (PCI-S) N/A 11/11/2011   Procedure: PERCUTANEOUS CORONARY STENT INTERVENTION (PCI-S);  Surgeon: Jettie Booze, MD;  Location: [facility] C[facility] LAB;  Service: Cardiovascular;  Laterality: N/A;   PERIPHERAL VASCULAR BALLOON ANGIOPLASTY Right 08/08/2020   Procedure: PERIPHERAL VASCULAR BALLOON ANGIOPLASTY;  Surgeon: Angelia Mould, MD;  Location: [place] [facility] LAB;  Service: Cardiovascular;  Laterality: Right;  Posterior tibial    SHOULDER SURGERY Right    X 2   STUMP REVISION Right 01/09/2021   Procedure: REVISION RIGHT BELOW KNEE AMPUTATION;  Surgeon: [name], MD;  Location: [place];  Service: Orthopedics;  Laterality: Right;   TONSILLECTOMY  ~ [place] Right 04/13/2019   Procedure: RIGHT TOTAL HIP REVISION-POSTERIOR  APPROACH LATERAL;  Surgeon: Marybelle Killings, MD;  Location: [place];  Service: Orthopedics;  Laterality: Right;   TOTAL KNEE ARTHROPLASTY Left 07/23/2016   Procedure: LEFT TOTAL KNEE ARTHROPLASTY;  Surgeon: Marybelle Killings, MD;  Location: [place];  Service: Orthopedics;  Laterality: Left;   Social History   Occupational  History   Occupation: Retired  Tobacco Use   Smoking status: Former   Smokeless tobacco: Current    Types: Chew   Tobacco comments:    quit 60 years ago  Vaping Use   Vaping Use: Never used  Substance and Sexual Activity   Alcohol use: No   Drug use: No   Sexual activity: Not Currently

## 2022-06-03 ENCOUNTER — Ambulatory Visit (INDEPENDENT_AMBULATORY_CARE_PROVIDER_SITE_OTHER): Payer: Medicare Other | Admitting: Orthopedic Surgery

## 2022-06-03 ENCOUNTER — Encounter: Payer: Self-pay | Admitting: Orthopedic Surgery

## 2022-06-03 DIAGNOSIS — I25118 Atherosclerotic heart disease of native coronary artery with other forms of angina pectoris: Secondary | ICD-10-CM | POA: Diagnosis not present

## 2022-06-03 DIAGNOSIS — Z89511 Acquired absence of right leg below knee: Secondary | ICD-10-CM | POA: Diagnosis not present

## 2022-06-03 DIAGNOSIS — L97911 Non-pressure chronic ulcer of unspecified part of right lower leg limited to breakdown of skin: Secondary | ICD-10-CM

## 2022-06-03 NOTE — Progress Notes (Signed)
Office Visit Note   Patient: Keith Espinoza           Date of Birth: October 17, 1940           MRN: [id number] Visit Date: 06/03/2022              Requested by: [name], MD [address] [place] [address] [place] ,  [place] [address] PCP: [name], MD  Chief Complaint  Patient presents with   Right Leg - Follow-up      HPI: Patient is an 81 year old gentleman who presents in follow-up for his right below-knee amputation.  Patient states he has developed a new pressure points laterally over the distal fibula.  Patient states he has a blister.  Assessment & Plan: Visit Diagnoses:  1. Right below-knee amputee ([place])   2. Ulcer of right leg, limited to breakdown of skin ([place])     Plan: Patient was provided a prescription for biotech he will follow-up with them anticipate with padding to the socket this can unload his new pressure points.  Follow-Up Instructions: Return if symptoms worsen or fail to improve.   Ortho Exam  Patient is alert, oriented, no adenopathy, well-dressed, normal affect, normal respiratory effort. Examination patient has significant decreased volume of the residual limb.  He has bony prominences and has developed a pressure point over the distal fibula 1 over the fibular head and 1 over the tibial tubercle.  There is no cellulitis no full-thickness skin defect.  Imaging: No results found. No images are attached to the encounter.  Labs: Lab Results  Component Value Date   HGBA1C 8.3 (A) 10/06/2021   HGBA1C 7.5 (A) 06/30/2021   HGBA1C 7.5 (H) 02/01/2021   REPTSTATUS 04/18/2019 FINAL 04/13/2019   GRAMSTAIN  04/13/2019    RARE WBC PRESENT, PREDOMINANTLY PMN NO ORGANISMS SEEN    CULT  04/13/2019    No growth aerobically or anaerobically. Performed at [facility], [place] [address], [place],  [address]      Lab Results  Component Value Date   ALBUMIN 3.5 01/31/2021   ALBUMIN 2.9 (L) 12/17/2020   ALBUMIN 4.4 04/10/2019    Lab  Results  Component Value Date   MG 2.7 (H) 02/01/2021   No results found for: "VD25OH"  No results found for: "PREALBUMIN"    Latest Ref Rng & Units 02/03/2021    3:13 AM 02/01/2021    6:25 AM 01/31/2021    7:15 PM  CBC EXTENDED  WBC 4.0 - 10.5 K/uL 6.2  9.3  11.6   RBC 4.22 - 5.81 MIL/uL 3.36  3.59  3.88   Hemoglobin 13.0 - 17.0 g/dL 9.9  10.6  11.2   HCT 39.0 - 52.0 % 28.9  32.1  35.9   Platelets 150 - 400 K/uL 152  171  187      There is no height or weight on file to calculate BMI.  Orders:  No orders of the defined types were placed in this encounter.  No orders of the defined types were placed in this encounter.    Procedures: No procedures performed  Clinical Data: No additional findings.  ROS:  All other systems negative, except as noted in the HPI. Review of Systems  Objective: Vital Signs: There were no vitals taken for this visit.  Specialty Comments:  No specialty comments available.  PMFS History: Patient Active Problem List   Diagnosis Date Noted   Type 2 diabetes mellitus with hyperglycemia, with long-term current use  of insulin ([place]) 02/15/2022   Adenoma of left adrenal gland 02/15/2022   History of right below knee amputation ([place]) 10/06/2021   Exudative age-related macular degeneration of left eye with active choroidal neovascularization ([place]) 07/28/2021   Exudative age-related macular degeneration of right eye with active choroidal neovascularization ([place]) 06/01/2021   Intermediate stage nonexudative age-related macular degeneration of both eyes 06/01/2021   Type 2 diabetes mellitus with diabetic polyneuropathy, with long-term current use of insulin ([place]) 03/24/2021   Hypokalemia    Constipation 02/02/2021   Fecal impaction ([place]) 02/01/2021   Wound dehiscence 01/09/2021   Dehiscence of amputation stump ([place])    Status post percutaneous transluminal coronary angioplasty 01/06/2021   Type II diabetes mellitus, uncontrolled 01/06/2021   Acute  pancreatitis 01/06/2021   Diabetic peripheral vascular disease ([place]) 01/06/2021   Encounter for screening for other disorder 01/06/2021   Enlarged prostate 01/06/2021   Foot ulcer, right ([place]) 01/06/2021   Gout 01/06/2021   Headache 01/06/2021   Neck pain 01/06/2021   Hypoglycemia 01/06/2021   Loss of appetite 01/06/2021   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   Peripheral neuropathy 01/06/2021   Sciatica 01/06/2021   Vitamin B12 deficiency 01/06/2021   Vitamin D deficiency 01/06/2021   Weakness 01/06/2021   Diabetes mellitus type 2 with neurological manifestations ([place]) 01/06/2021   Protein-calorie malnutrition, severe 12/26/2020   Acute blood loss anemia 12/26/2020   Prerenal azotemia 12/26/2020   Below-knee amputation of right lower extremity ([place]) 12/12/2020   Gangrene of right foot ([place])    Diabetic neuropathy ([place]) 11/17/2020   Hyperglycemia due to type 2 diabetes mellitus ([place]) 11/17/2020   Long term (current) use of insulin ([place]) 11/17/2020   Obesity 11/17/2020   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/2019   Hip dislocation, right ([place]) 04/13/2019   Other intervertebral disc degeneration, lumbar region 03/30/2019   CAD (coronary artery disease) 01/30/2019   Tobacco abuse 01/30/2019   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   Burn, foot, second degree, left, initial encounter 06/08/2017   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 03/16/2017   S/P total knee arthroplasty, left 10/26/2016   Hyperlipidemia 09/04/2014   Thrombocytopenia (HCC)    Precordial chest pain 04/05/2014   Coronary atherosclerosis of native coronary artery 10/01/2013   Other and unspecified hyperlipidemia 10/01/2013   Primary hypertension 10/01/2013   Diabetes mellitus ([place]) 10/01/2013   Esophageal reflux 10/01/2013   Hypertrophy of prostate without urinary obstruction and other lower urinary tract symptoms (LUTS) 10/01/2013   Past Medical History:  Diagnosis Date   Allergic  rhinitis    Allergic rhinitis    Arthritis    Basal cell carcinoma 11/01/2019    bcc left chest treatment TX cx3 49f    Chronic leg pain    right   Chronic lower back pain    Coronary artery disease    a. Stenting to RCA 2004; staged DES to LAD and Cx 2004. DES to mRCA 2012. b. DES to mCx, PTCA to dCx 11/2011. c. Lateral wall MI 2013 s/p PTCA to distal Cx & DES to mid OM2 11/2011. d. Low risk nuc 04/2014, EF wnl.   COVID-19    Diabetes mellitus    Insulin dependent   Diabetic neuropathy (HCC)    MILD   Diverticulosis    Dysrhythmia    GRosanna Randysyndrome    Gout    right wrist; right foot; right elbow; have had it since 1970's   H/O hiatal hernia    Heart murmur  History of echocardiogram    aortic sclerosis per echo 12/09 EF 65%, otherwise normal   History of hemorrhoids    BLEEDING   History of kidney stones    h/o   Hypertension    Diagnosed 1995    Myocardial infarction [facility])    Pancreatic pseudocyst    a. s/p remote drainage 2006.   Thrombocytopenia ([place])    Seen on oldest labs in system from 2004   Vitamin B 12 deficiency    orally replaced    Family History  Problem Relation Age of Onset   Diabetes Mother    Hyperlipidemia Mother    Hypertension Mother    Cancer Father    Hypertension Father    Diabetes Sister    Hypertension Sister    Cancer Brother    Heart attack Neg Hx     Past Surgical History:  Procedure Laterality Date   ABDOMINAL AORTOGRAM W/LOWER EXTREMITY Bilateral 08/08/2020   Procedure: ABDOMINAL AORTOGRAM W/LOWER EXTREMITY;  Surgeon: Angelia Mould, MD;  Location: [place] [facility];  Service: Cardiovascular;  Laterality: Bilateral;   AMPUTATION Right 12/12/2020   Procedure: RIGHT BELOW KNEE AMPUTATION;  Surgeon: [name], MD;  Location: [place];  Service: Orthopedics;  Laterality: Right;   BACK SURGERY     "total of 3 times" S/P fall    CARPAL TUNNEL RELEASE Bilateral    CHOLECYSTECTOMY  1990's   COLONOSCOPY     CORONARY  ANGIOPLASTY  11/11/11   CORONARY ANGIOPLASTY WITH STENT PLACEMENT  09/30/2011   "1 then; makes a total of 4"   CORONARY ANGIOPLASTY WITH STENT PLACEMENT  11/11/11   "1; makes a total of 5"   INGUINAL HERNIA REPAIR  2003   right   JOINT REPLACEMENT Right 04/03/2002   hip replacment   KNEE ARTHROSCOPY  1990's   left   LEFT HEART CATHETERIZATION WITH CORONARY ANGIOGRAM N/A 09/30/2011   Procedure: LEFT HEART CATHETERIZATION WITH CORONARY ANGIOGRAM;  Surgeon: Jettie Booze, MD;  Location: [facility] CATH LAB;  Service: Cardiovascular;  Laterality: N/A;  possible PCI   LEFT HEART CATHETERIZATION WITH CORONARY ANGIOGRAM N/A 11/15/2011   Procedure: LEFT HEART CATHETERIZATION WITH CORONARY ANGIOGRAM;  Surgeon: Jettie Booze, MD;  Location: [facility] CATH LAB;  Service: Cardiovascular;  Laterality: N/A;   PERCUTANEOUS CORONARY STENT INTERVENTION (PCI-S)  09/30/2011   Procedure: PERCUTANEOUS CORONARY STENT INTERVENTION (PCI-S);  Surgeon: Jettie Booze, MD;  Location: [facility] CATH LAB;  Service: Cardiovascular;;   PERCUTANEOUS CORONARY STENT INTERVENTION (PCI-S) N/A 11/11/2011   Procedure: PERCUTANEOUS CORONARY STENT INTERVENTION (PCI-S);  Surgeon: Jettie Booze, MD;  Location: [facility] CATH LAB;  Service: Cardiovascular;  Laterality: N/A;   PERIPHERAL VASCULAR BALLOON ANGIOPLASTY Right 08/08/2020   Procedure: PERIPHERAL VASCULAR BALLOON ANGIOPLASTY;  Surgeon: Angelia Mould, MD;  Location: [place] [facility] LAB;  Service: Cardiovascular;  Laterality: Right;  Posterior tibial    SHOULDER SURGERY Right    X 2   STUMP REVISION Right 01/09/2021   Procedure: REVISION RIGHT BELOW KNEE AMPUTATION;  Surgeon: [name], MD;  Location: [place];  Service: Orthopedics;  Laterality: Right;   TONSILLECTOMY  ~ [place] Right 04/13/2019   Procedure: RIGHT TOTAL HIP REVISION-POSTERIOR  APPROACH LATERAL;  Surgeon: Marybelle Killings, MD;  Location: [place];  Service: Orthopedics;  Laterality: Right;   TOTAL KNEE  ARTHROPLASTY Left 07/23/2016   Procedure: LEFT TOTAL KNEE ARTHROPLASTY;  Surgeon: Marybelle Killings, MD;  Location: [place];  Service: Orthopedics;  Laterality: Left;   Social History   Occupational History   Occupation: Retired  Tobacco Use   Smoking status: Former   Smokeless tobacco: Current    Types: Chew   Tobacco comments:    quit 60 years ago  Vaping Use   Vaping Use: Never used  Substance and Sexual Activity   Alcohol use: No   Drug use: No   Sexual activity: Not Currently

## 2022-06-09 DIAGNOSIS — I10 Essential (primary) hypertension: Secondary | ICD-10-CM | POA: Diagnosis not present

## 2022-06-09 DIAGNOSIS — E559 Vitamin D deficiency, unspecified: Secondary | ICD-10-CM | POA: Diagnosis not present

## 2022-06-09 DIAGNOSIS — Z23 Encounter for immunization: Secondary | ICD-10-CM | POA: Diagnosis not present

## 2022-06-09 DIAGNOSIS — E1151 Type 2 diabetes mellitus with diabetic peripheral angiopathy without gangrene: Secondary | ICD-10-CM | POA: Diagnosis not present

## 2022-06-09 DIAGNOSIS — M109 Gout, unspecified: Secondary | ICD-10-CM | POA: Diagnosis not present

## 2022-06-09 DIAGNOSIS — E1342 Other specified diabetes mellitus with diabetic polyneuropathy: Secondary | ICD-10-CM | POA: Diagnosis not present

## 2022-06-09 DIAGNOSIS — I700 Atherosclerosis of aorta: Secondary | ICD-10-CM | POA: Diagnosis not present

## 2022-06-09 DIAGNOSIS — I25118 Atherosclerotic heart disease of native coronary artery with other forms of angina pectoris: Secondary | ICD-10-CM | POA: Diagnosis not present

## 2022-06-23 ENCOUNTER — Encounter: Payer: Self-pay | Admitting: Internal Medicine

## 2022-06-23 ENCOUNTER — Ambulatory Visit (INDEPENDENT_AMBULATORY_CARE_PROVIDER_SITE_OTHER): Payer: Medicare Other | Admitting: Internal Medicine

## 2022-06-23 VITALS — BP 124/82 | HR 65 | Ht 70.0 in | Wt 158.0 lb

## 2022-06-23 DIAGNOSIS — D3502 Benign neoplasm of left adrenal gland: Secondary | ICD-10-CM | POA: Diagnosis not present

## 2022-06-23 DIAGNOSIS — E1142 Type 2 diabetes mellitus with diabetic polyneuropathy: Secondary | ICD-10-CM | POA: Diagnosis not present

## 2022-06-23 DIAGNOSIS — E1165 Type 2 diabetes mellitus with hyperglycemia: Secondary | ICD-10-CM | POA: Diagnosis not present

## 2022-06-23 DIAGNOSIS — Z794 Long term (current) use of insulin: Secondary | ICD-10-CM | POA: Diagnosis not present

## 2022-06-23 DIAGNOSIS — E1159 Type 2 diabetes mellitus with other circulatory complications: Secondary | ICD-10-CM

## 2022-06-23 LAB — BASIC METABOLIC PANEL
BUN: 25 mg/dL — ABNORMAL HIGH (ref 6–23)
CO2: 30 mEq/L (ref 19–32)
Calcium: 10 mg/dL (ref 8.4–10.5)
Chloride: 101 mEq/L (ref 96–112)
Creatinine, Ser: 1.37 mg/dL (ref 0.40–1.50)
GFR: 48.22 mL/min — ABNORMAL LOW (ref >60.00)
Glucose, Bld: 320 mg/dL — ABNORMAL HIGH (ref 70–99)
Potassium: 4.2 mEq/L (ref 3.5–5.1)
Sodium: 140 mEq/L (ref 135–145)

## 2022-06-23 LAB — POCT GLYCOSYLATED HEMOGLOBIN (HGB A1C): Hemoglobin A1C: 9 % — AB (ref 4.0–5.6)

## 2022-06-23 LAB — POCT GLUCOSE (DEVICE FOR HOME USE): POC Glucose: 276 mg/dl — AB (ref 70–99)

## 2022-06-23 MED ORDER — INSULIN PEN NEEDLE 32G X 4 MM MISC: 1.0000 | Freq: Four times a day (QID) | 3 refills | Status: DC

## 2022-06-23 MED ORDER — NOVOLOG FLEXPEN 100 UNIT/ML ~~LOC~~ SOPN: PEN_INJECTOR | SUBCUTANEOUS | 3 refills | Status: DC

## 2022-06-23 MED ORDER — DEXCOM G7 SENSOR MISC: 1.0000 | 3 refills | Status: DC

## 2022-06-23 MED ORDER — TRESIBA FLEXTOUCH 100 UNIT/ML ~~LOC~~ SOPN: 14.0000 [IU] | PEN_INJECTOR | Freq: Every day | SUBCUTANEOUS | 3 refills | Status: DC

## 2022-06-23 NOTE — Progress Notes (Signed)
Name: Keith Espinoza  Age/ Sex: 81 y.o., male   MRN/ DOB: [id number], July 06, 1941     PCP: [name], MD   Reason for Endocrinology Evaluation: Type 2 Diabetes Mellitus  Initial Endocrine Consultative Visit: 03/24/2021    PATIENT IDENTIFIER: Keith Espinoza is a 81 y.o. male with a past medical history of Right BK amputations due  PVD, T2DM and Pancreatitis .Marland Kitchen The patient has followed with Endocrinology clinic since 03/24/2021 for consultative assistance with management of his diabetes.  DIABETIC HISTORY:  Keith Espinoza was diagnosed with DM 6389, Trulicity caused weight loss, Has been on Glimepiride in the past. His hemoglobin A1c has ranged from 6.6% in 2020, peaking at 7.7% in 2022.     Transitioned care from Dr. Nehemiah Settle in 2022    On his initial visit to our clinic he had an A1c 7.5 % He was on jardiance, Metformin and Basal insulin, which I have reduced basal insulin   Started pioglitazoe 10/2021     ADRENAL HISTORY : Pt found to have a 1 cm left adrenal nodule on CT imaging of abdomen on 01/31/2021. Of note, the pt was noted to have hypokalemia , this is chronic and has been on KCL for ~ 5 yrs      Aldo, renin, 24-hr urinary cortisol and catecholamine have all come back normal 03/2021   SUBJECTIVE:   During the last visit (02/12/2022): A1c 8.3%     Today (06/23/2022): Keith Espinoza is here for a follow up on diabetes management.  He is accompanied by his daughter today.  He checks his blood sugars 1 times daily. The patient has not had hypoglycemic episodes since the last clinic visit.  He continues to follow-up with Dr. Sharol Given for right below-knee amputation Patient continues to follow-up with cardiology for CAD (04/2022)  Denies nausea, vomiting or diarrhea     HOME DIABETES REGIMEN:  Tresiba 12 units daily  Metformin 1000 mg Twice a day  Jardiance 25 mg daily Pioglitazone 30 mg daily      Statin: Intolerant to Simvastatin due to severe myalgia   ACE-I/ARB: yes      METER DOWNLOAD SUMMARY: Did not bring     DIABETIC COMPLICATIONS: Microvascular complications:  Neuropathy, macular degeneration Denies: CKD Last Eye Exam: Completed 04/15/2022  Macrovascular complications:  CAD, PVD Denies: CVA   HISTORY:  Past Medical History:  Past Medical History:  Diagnosis Date   Allergic rhinitis    Allergic rhinitis    Arthritis    Basal cell carcinoma 11/01/2019    bcc left chest treatment [place] cx3 45f    Chronic leg pain    right   Chronic lower back pain    Coronary artery disease    a. Stenting to RCA 2004; staged DES to LAD and Cx 2004. DES to mRCA 2012. b. DES to mCx, PTCA to dCx 11/2011. c. Lateral wall MI 2013 s/p PTCA to distal Cx & DES to mid OM2 11/2011. d. Low risk nuc 04/2014, EF wnl.   COVID-19    Diabetes mellitus    Insulin dependent   Diabetic neuropathy (HCC)    MILD   Diverticulosis    Dysrhythmia    GRosanna Randysyndrome    Gout    right wrist; right foot; right elbow; have had it since 1970's   H/O hiatal hernia    Heart murmur    History of echocardiogram    aortic sclerosis per echo 12/09 EF 65%, otherwise normal   History  of hemorrhoids    BLEEDING   History of kidney stones    h/o   Hypertension    Diagnosed 1995    Myocardial infarction [facility])    Pancreatic pseudocyst    a. s/p remote drainage 2006.   Thrombocytopenia ([place])    Seen on oldest labs in system from 2004   Vitamin B 12 deficiency    orally replaced   Past Surgical History:  Past Surgical History:  Procedure Laterality Date   ABDOMINAL AORTOGRAM W/LOWER EXTREMITY Bilateral 08/08/2020   Procedure: ABDOMINAL AORTOGRAM W/LOWER EXTREMITY;  Surgeon: Angelia Mould, MD;  Location: [place] CV LAB;  Service: Cardiovascular;  Laterality: Bilateral;   AMPUTATION Right 12/12/2020   Procedure: RIGHT BELOW KNEE AMPUTATION;  Surgeon: [name], MD;  Location: [place];  Service: Orthopedics;  Laterality: Right;   BACK SURGERY      "total of 3 times" S/P fall    CARPAL TUNNEL RELEASE Bilateral    CHOLECYSTECTOMY  1990's   COLONOSCOPY     CORONARY ANGIOPLASTY  11/11/11   CORONARY ANGIOPLASTY WITH STENT PLACEMENT  09/30/2011   "1 then; makes a total of 4"   CORONARY ANGIOPLASTY WITH STENT PLACEMENT  11/11/11   "1; makes a total of 5"   INGUINAL HERNIA REPAIR  2003   right   JOINT REPLACEMENT Right 04/03/2002   hip replacment   KNEE ARTHROSCOPY  1990's   left   LEFT HEART CATHETERIZATION WITH CORONARY ANGIOGRAM N/A 09/30/2011   Procedure: LEFT HEART CATHETERIZATION WITH CORONARY ANGIOGRAM;  Surgeon: Jettie Booze, MD;  Location: [facility] CATH LAB;  Service: Cardiovascular;  Laterality: N/A;  possible PCI   LEFT HEART CATHETERIZATION WITH CORONARY ANGIOGRAM N/A 11/15/2011   Procedure: LEFT HEART CATHETERIZATION WITH CORONARY ANGIOGRAM;  Surgeon: Jettie Booze, MD;  Location: [facility] CATH LAB;  Service: Cardiovascular;  Laterality: N/A;   PERCUTANEOUS CORONARY STENT INTERVENTION (PCI-S)  09/30/2011   Procedure: PERCUTANEOUS CORONARY STENT INTERVENTION (PCI-S);  Surgeon: Jettie Booze, MD;  Location: [facility] CATH LAB;  Service: Cardiovascular;;   PERCUTANEOUS CORONARY STENT INTERVENTION (PCI-S) N/A 11/11/2011   Procedure: PERCUTANEOUS CORONARY STENT INTERVENTION (PCI-S);  Surgeon: Jettie Booze, MD;  Location: [facility] CATH LAB;  Service: Cardiovascular;  Laterality: N/A;   PERIPHERAL VASCULAR BALLOON ANGIOPLASTY Right 08/08/2020   Procedure: PERIPHERAL VASCULAR BALLOON ANGIOPLASTY;  Surgeon: Angelia Mould, MD;  Location: [place] CV LAB;  Service: Cardiovascular;  Laterality: Right;  Posterior tibial    SHOULDER SURGERY Right    X 2   STUMP REVISION Right 01/09/2021   Procedure: REVISION RIGHT BELOW KNEE AMPUTATION;  Surgeon: [name], MD;  Location: [place];  Service: Orthopedics;  Laterality: Right;   TONSILLECTOMY  ~ [place] Right 04/13/2019   Procedure: RIGHT TOTAL HIP REVISION-POSTERIOR   APPROACH LATERAL;  Surgeon: Marybelle Killings, MD;  Location: [place];  Service: Orthopedics;  Laterality: Right;   TOTAL KNEE ARTHROPLASTY Left 07/23/2016   Procedure: LEFT TOTAL KNEE ARTHROPLASTY;  Surgeon: Marybelle Killings, MD;  Location: [place];  Service: Orthopedics;  Laterality: Left;   Social History:  reports that he has quit smoking. His smokeless tobacco use includes chew. He reports that he does not drink alcohol and does not use drugs. Family History:  Family History  Problem Relation Age of Onset   Diabetes Mother    Hyperlipidemia Mother    Hypertension Mother    Cancer Father    Hypertension Father  Diabetes Sister    Hypertension Sister    Cancer Brother    Heart attack Neg Hx      HOME MEDICATIONS: Allergies as of 06/23/2022       Reactions   Simvastatin Other (See Comments)   SEVERE MYALGIAS   Zetia [ezetimibe] Other (See Comments)   MYALGIAS   Dilaudid [hydromorphone Hcl] Other (See Comments)   hallucination        Medication List        Accurate as of June 23, 2022  2:21 PM. If you have any questions, ask your nurse or doctor.          acetaminophen 500 MG tablet Commonly known as: TYLENOL Take 1,000 mg by mouth every 6 (six) hours as needed (pain).   allopurinol 300 MG tablet Commonly known as: ZYLOPRIM Take 300 mg by mouth daily.   amLODipine 2.5 MG tablet Commonly known as: NORVASC Take 1 tablet (2.5 mg total) by mouth daily.   aspirin EC 81 MG tablet Take 81 mg by mouth daily.   B-12 2500 MCG Tabs Take 2,500 mcg by mouth daily.   clopidogrel 75 MG tablet Commonly known as: PLAVIX Take 1 tablet (75 mg total) by mouth daily.   Dexcom G7 Sensor Misc 1 Device by Does not apply route as directed. Started by: Dorita Sciara, MD   empagliflozin 25 MG Tabs tablet Commonly known as: JARDIANCE Take 1 tablet (25 mg total) by mouth daily.   gabapentin 400 MG capsule Commonly known as: NEURONTIN Take 1 capsule (400 mg total) by  mouth 3 (three) times daily. What changed: when to take this   gabapentin 100 MG capsule Commonly known as: NEURONTIN Take 2 capsules (200 mg total) by mouth at bedtime. What changed: how much to take   hydrochlorothiazide 12.5 MG tablet Commonly known as: HYDRODIURIL Take 1 tablet (12.5 mg total) by mouth daily.   Insulin Pen Needle 32G X 4 MM Misc 1 Device by Does not apply route in the morning, at noon, in the evening, and at bedtime. What changed: when to take this Changed by: Dorita Sciara, MD   isosorbide mononitrate 30 MG 24 hr tablet Commonly known as: IMDUR Take 1 tablet (30 mg total) by mouth daily. Please schedule yearly appointment for future refills. Thank you   losartan 50 MG tablet Commonly known as: COZAAR Take 1 tablet (50 mg total) by mouth daily.   metFORMIN 1000 MG tablet Commonly known as: GLUCOPHAGE Take 1 tablet (1,000 mg total) by mouth 2 (two) times daily.   nitroGLYCERIN 0.4 MG SL tablet Commonly known as: NITROSTAT Place 0.4 mg under the tongue every 5 (five) minutes x 3 doses as needed for chest pain.   NovoLOG FlexPen 100 UNIT/ML FlexPen Generic drug: insulin aspart Max daily 30 units Started by: Dorita Sciara, MD   ONE TOUCH ULTRA TEST test strip Generic drug: glucose blood CHECK BLOOD SUGAR ONCE DAILY AS DIRECTED   pioglitazone 30 MG tablet Commonly known as: Actos Take 1 tablet (30 mg total) by mouth daily.   polyethylene glycol 17 g packet Commonly known as: MiraLax Take 17 g by mouth 2 (two) times daily.   potassium chloride SA 20 MEQ tablet Commonly known as: KLOR-CON M Take 1 tablet (20 mEq total) by mouth daily.   senna-docusate 8.6-50 MG tablet Commonly known as: Senokot-S Take 1 tablet by mouth at bedtime.   terazosin 5 MG capsule Commonly known as: HYTRIN Take 1 capsule (5 mg  total) by mouth at bedtime.   traMADol 50 MG tablet Commonly known as: ULTRAM Take 1 tablet (50 mg total) by mouth every 6  (six) hours as needed for moderate pain.   traZODone 50 MG tablet Commonly known as: DESYREL Take 1 tablet (50 mg total) by mouth at bedtime as needed for sleep.   Tyler Aas FlexTouch 100 UNIT/ML FlexTouch Pen Generic drug: insulin degludec Inject 14 Units into the skin daily. What changed: how much to take Changed by: Dorita Sciara, MD   Vitamin D3 50 MCG (2000 UT) Tabs Take 2,000 Units by mouth daily.         OBJECTIVE:   Vital Signs:BP 124/82 (BP Location: Left Arm, Patient Position: Sitting, Cuff Size: Large)   Pulse 65   Ht '5\' 10"'$  (1.778 m)   Wt 158 lb (71.7 kg)   SpO2 95%   BMI 22.67 kg/m   Wt Readings from Last 3 Encounters:  06/23/22 158 lb (71.7 kg)  04/15/22 154 lb 9.6 oz (70.1 kg)  02/12/22 151 lb (68.5 kg)     Exam: General: Pt appears well and is in NAD  Neck: General: Supple without adenopathy. Thyroid: Thyroid size normal.  No goiter or nodules appreciated.   Lungs: Clear with good BS bilat with no rales, rhonchi, or wheezes  Heart: RRR  Neuro: MS is good with appropriate affect, pt is alert and Ox3     DATA REVIEWED:  Lab Results  Component Value Date   HGBA1C 9.0 (A) 06/23/2022   HGBA1C 8.3 (A) 10/06/2021   HGBA1C 7.5 (A) 06/30/2021   02/03/2022 BUN/cr 26/1.180 GFR 62 TSH 1.950    CT abdomen 01/31/2021    . Large amount of retained stool throughout the colon consistent with constipation. Large amount of stool within the rectal vault consistent with fecal impaction. 2. Punctate bilateral nonobstructing renal calculi. 3. Indeterminate 10 mm left adrenal nodule, likely adenoma. 4.  Aortic Atherosclerosis (ICD10-I70.0).   ASSESSMENT / PLAN / RECOMMENDATIONS:   1) Type 2 Diabetes Mellitus, Optimally controlled, With neuropathic and macrovascular  complications - Most recent A1c of 9.0 %. Goal A1c < 7.5 %.     - A1c trended up , patient with dietary indiscretions and would like to continue - Pt with hx of pancreatitis , hence  DPP-4 inhibitors and GLP-1 agonists are contra-indicated.  -Tolerating pioglitazone without side effects -Patient with postprandial hyperglycemia, in office BG 276 mg/DL -I have recommended CGM technology, he was given a sample as well as a prescription was sent for Dexcom G7 -He will be provided with Humalog to use per correction scale -I will also increase his basal insulin     MEDICATIONS: Increase Tresiba  14  units daily  Continue Metformin 1000 mg Twice a day  Continue Jardiance 25 mg daily Continue pioglitazone 30 mg daily  Start correction factor: NovoLog (BG -130/40) with each meal    EDUCATION / INSTRUCTIONS: BG monitoring instructions: Patient is instructed to check his blood sugars 1 times a day, fasting . Call [place] [facility] if: BG persistently < 70  I reviewed the Rule of 15 for the treatment of hypoglycemia in detail with the patient. Literature supplied.    2) Diabetic complications:  Eye:  Unknown to have diabetic retinopathy.  Neuro/ Feet: Does  have known diabetic peripheral neuropathy .  Renal: Patient does not have known baseline CKD. He   is  on an ACEI/ARB at present.    3) Left Adrenal Adenoma:   -  Hormonal workup has ruled out cushing syndrome, Pheo and hyperaldo 03/2021 - Will repeat adrenal imaging on next visit -We will proceed with hyper Aldo and pheochromocytoma screening -We will check for cortisol on the next time we do 24-hour urinary collection   F/U in 4 months     Signed electronically by: Mack Guise, MD  [facility]  [facility] [address] [place], [place] [address] Phone: [phone] FAX: [phone]   CC: [name], MD [address] [place] [address] Phone: [phone]  Fax: [phone]  Return to Endocrinology clinic as below: Future Appointments  Date Time Provider [place]  08/09/2022 10:15 AM Rankin, Clent Demark, MD  [facility] None  11/11/2022 10:50 AM Avian Konigsberg, Melanie Crazier, MD [facility] None

## 2022-06-23 NOTE — Patient Instructions (Signed)
-   Increase Tresiba 14 units daily  - Continue Metformin 1000 mg Twice a day  - Continue Jardiance 25 mg daily - Continue  Actos 30 mg , 1 tablet daily   - Novolog correctional insulin:  Use the scale below to help guide you before meals   Blood sugar before meal Number of units to inject  Less than 170 0 unit  171 -  210 1 units  210 -  250 2 units  251 -  290 3 units  291 -  330 4 units  331 -  370 5 units  371 -  410 6 units        HOW TO TREAT LOW BLOOD SUGARS (Blood sugar LESS THAN 70 MG/DL) Please follow the RULE OF 15 for the treatment of hypoglycemia treatment (when your (blood sugars are less than 70 mg/dL)   STEP 1: Take 15 grams of carbohydrates when your blood sugar is low, which includes:  3-4 GLUCOSE TABS  OR 3-4 OZ OF JUICE OR REGULAR SODA OR ONE TUBE OF GLUCOSE GEL    STEP 2: RECHECK blood sugar in 15 MINUTES STEP 3: If your blood sugar is still low at the 15 minute recheck --> then, go back to STEP 1 and treat AGAIN with another 15 grams of carbohydrates.

## 2022-06-25 ENCOUNTER — Telehealth: Payer: Self-pay | Admitting: Internal Medicine

## 2022-06-25 NOTE — Telephone Encounter (Signed)
Please let the patient know that his blood test shows that his kidney is dehydrated, this is most likely due to the high sugar, as on the blood test it was 320 mg/dL    I hope he is starting to use the short acting insulin per correction scale and has increased [place] as prescribed     Thanks

## 2022-06-25 NOTE — Telephone Encounter (Signed)
Patient notified and verbalized understanding. 

## 2022-07-01 LAB — METANEPHRINES, PLASMA
Metanephrine, Free: 33 pg/mL (ref <=57)
Normetanephrine, Free: 220 pg/mL — ABNORMAL HIGH (ref <=148)
Total Metanephrines-Plasma: 253 pg/mL — ABNORMAL HIGH (ref <=205)

## 2022-07-01 LAB — ALDOSTERONE + RENIN ACTIVITY W/ RATIO
ALDO / PRA Ratio: 1.8 Ratio (ref 0.9–28.9)
Aldosterone: 8 ng/dL
Renin Activity: 4.53 ng/mL/h (ref 0.25–5.82)

## 2022-07-01 LAB — EXTRA SPECIMEN

## 2022-07-02 ENCOUNTER — Encounter: Payer: Self-pay | Admitting: Internal Medicine

## 2022-07-03 DIAGNOSIS — S59901A Unspecified injury of right elbow, initial encounter: Secondary | ICD-10-CM | POA: Diagnosis not present

## 2022-07-03 DIAGNOSIS — S59911A Unspecified injury of right forearm, initial encounter: Secondary | ICD-10-CM | POA: Diagnosis not present

## 2022-07-03 DIAGNOSIS — S51011A Laceration without foreign body of right elbow, initial encounter: Secondary | ICD-10-CM | POA: Diagnosis not present

## 2022-07-03 DIAGNOSIS — Z23 Encounter for immunization: Secondary | ICD-10-CM | POA: Diagnosis not present

## 2022-08-09 ENCOUNTER — Encounter (INDEPENDENT_AMBULATORY_CARE_PROVIDER_SITE_OTHER): Payer: Medicare Other | Admitting: Ophthalmology

## 2022-08-09 DIAGNOSIS — H353211 Exudative age-related macular degeneration, right eye, with active choroidal neovascularization: Secondary | ICD-10-CM | POA: Diagnosis not present

## 2022-08-09 DIAGNOSIS — H353132 Nonexudative age-related macular degeneration, bilateral, intermediate dry stage: Secondary | ICD-10-CM | POA: Diagnosis not present

## 2022-08-09 DIAGNOSIS — H353221 Exudative age-related macular degeneration, left eye, with active choroidal neovascularization: Secondary | ICD-10-CM | POA: Diagnosis not present

## 2022-08-23 DIAGNOSIS — H353132 Nonexudative age-related macular degeneration, bilateral, intermediate dry stage: Secondary | ICD-10-CM | POA: Diagnosis not present

## 2022-08-23 DIAGNOSIS — H353211 Exudative age-related macular degeneration, right eye, with active choroidal neovascularization: Secondary | ICD-10-CM | POA: Diagnosis not present

## 2022-08-23 DIAGNOSIS — E119 Type 2 diabetes mellitus without complications: Secondary | ICD-10-CM | POA: Diagnosis not present

## 2022-08-23 DIAGNOSIS — H353221 Exudative age-related macular degeneration, left eye, with active choroidal neovascularization: Secondary | ICD-10-CM | POA: Diagnosis not present

## 2022-09-16 ENCOUNTER — Ambulatory Visit (HOSPITAL_COMMUNITY)
Admission: RE | Admit: 2022-09-16 | Discharge: 2022-09-16 | Disposition: A | Discharge status: Home / Self Care | Payer: Medicare Other | Source: Ambulatory Visit | Attending: Vascular Surgery | Admitting: Vascular Surgery

## 2022-09-16 ENCOUNTER — Encounter: Payer: Self-pay | Admitting: Vascular Surgery

## 2022-09-16 ENCOUNTER — Ambulatory Visit (INDEPENDENT_AMBULATORY_CARE_PROVIDER_SITE_OTHER): Payer: Medicare Other | Admitting: Vascular Surgery

## 2022-09-16 VITALS — BP 133/57 | HR 67 | Temp 97.7°F | Resp 20 | Ht 70.0 in | Wt 159.0 lb

## 2022-09-16 DIAGNOSIS — I25118 Atherosclerotic heart disease of native coronary artery with other forms of angina pectoris: Secondary | ICD-10-CM

## 2022-09-16 DIAGNOSIS — I739 Peripheral vascular disease, unspecified: Secondary | ICD-10-CM | POA: Diagnosis not present

## 2022-09-16 NOTE — Progress Notes (Signed)
REASON FOR VISIT:   Follow-up of peripheral artery disease  MEDICAL ISSUES:   PERIPHERAL ARTERIAL DISEASE: This patient has stable claudication of the left calf.  His ABIs are stable and actually slightly improved on the left.  He gets around well with his right BKA.  His daughter would like [place] to continue to follow him closely and they would prefer not to be seen by the PA.  I will see him back in September with follow-up ABIs.  I have explained that I will be retiring soon after that.  He is not a smoker although he does chew tobacco.  I have encouraged him to stay as active as possible.  HPI:   Keith Espinoza is a pleasant 81 y.o. male who underwent a previous right below the knee amputation.  He has infrainguinal arterial occlusive disease on the left and 1 been following this closely.  He comes in for routine follow-up visit.  He has mild claudication in the left calf.  He denies any rest pain or nonhealing ulcers.  He chews tobacco but does not smoke cigarettes.  There have been no significant changes in his medical history except that he gets dizzy if he stands up quickly.  In addition his blood pressures been running a little low at times.  He always feels better after he drinks some water and his blood pressure improves after that typically.     Past Medical History:  Diagnosis Date   Allergic rhinitis    Allergic rhinitis    Arthritis    Basal cell carcinoma 11/01/2019    bcc left chest treatment TX cx3 66f    Chronic leg pain    right   Chronic lower back pain    Coronary artery disease    a. Stenting to RCA 2004; staged DES to LAD and Cx 2004. DES to mRCA 2012. b. DES to mCx, PTCA to dCx 11/2011. c. Lateral wall MI 2013 s/p PTCA to distal Cx & DES to mid OM2 11/2011. d. Low risk nuc 04/2014, EF wnl.   COVID-19    Diabetes mellitus    Insulin dependent   Diabetic neuropathy ([place]    MILD   Diverticulosis    Dysrhythmia    GRosanna Randysyndrome    Gout    right wrist; right  foot; right elbow; have had it since 1970's   H/O hiatal hernia    Heart murmur    History of echocardiogram    aortic sclerosis per echo 12/09 EF 65%, otherwise normal   History of hemorrhoids    BLEEDING   History of kidney stones    h/o   Hypertension    Diagnosed 1995    [facility]ocardial infarction ([facility]    Pancreatic pseudocyst    a. s/p remote drainage 2006.   [place]    Seen on oldest labs in system from 2004   Vitamin B 12 deficiency    orally replaced    Family History  Problem Relation Age of Onset   Diabetes Mother    Hyperlipidemia Mother    Hypertension Mother    Cancer Father    Hypertension Father    Diabetes Sister    Hypertension Sister    Cancer Brother    Heart attack Neg Hx     SOCIAL HISTORY: Social History   Tobacco Use   Smoking status: Former   Smokeless tobacco: Current    Types: Chew   Tobacco comments:    quit  60 years ago  Substance Use Topics   Alcohol use: No    Allergies  Allergen Reactions   Simvastatin Other (See Comments)    SEVERE MYALGIAS    Zetia [Ezetimibe] Other (See Comments)    MYALGIAS   Dilaudid [Hydromorphone Hcl] Other (See Comments)    hallucination    Current Outpatient Medications  Medication Sig Dispense Refill   acetaminophen (TYLENOL) 500 MG tablet Take 1,000 mg by mouth every 6 (six) hours as needed (pain).     allopurinol (ZYLOPRIM) 300 MG tablet Take 300 mg by mouth daily.     amLODipine (NORVASC) 2.5 MG tablet Take 1 tablet (2.5 mg total) by mouth daily. 90 tablet 3   aspirin EC 81 MG tablet Take 81 mg by mouth daily.     Cholecalciferol (VITAMIN D3) 50 MCG (2000 UT) TABS Take 2,000 Units by mouth daily.      clopidogrel (PLAVIX) 75 MG tablet Take 1 tablet (75 mg total) by mouth daily. 30 tablet 0   Continuous Blood Gluc Sensor (DEXCOM G7 SENSOR) MISC 1 Device by Does not apply route as directed. 9 each 3   Cyanocobalamin (B-12) 2500 MCG TABS Take 2,500 mcg by mouth daily.      empagliflozin (JARDIANCE) 25 MG TABS tablet Take 1 tablet (25 mg total) by mouth daily. 90 tablet 3   gabapentin (NEURONTIN) 100 MG capsule Take 2 capsules (200 mg total) by mouth at bedtime. (Patient taking differently: Take 600 mg by mouth at bedtime.) 30 capsule 0   gabapentin (NEURONTIN) 400 MG capsule Take 1 capsule (400 mg total) by mouth 3 (three) times daily. (Patient taking differently: Take 400 mg by mouth daily.) 90 capsule 0   hydrochlorothiazide (HYDRODIURIL) 12.5 MG tablet Take 1 tablet (12.5 mg total) by mouth daily. 30 tablet 0   insulin aspart (NOVOLOG FLEXPEN) 100 UNIT/ML FlexPen Max daily 30 units 30 mL 3   insulin degludec (TRESIBA FLEXTOUCH) 100 UNIT/ML FlexTouch Pen Inject 14 Units into the skin daily. 15 mL 3   Insulin Pen Needle 32G X 4 MM MISC 1 Device by Does not apply route in the morning, at noon, in the evening, and at bedtime. 400 each 3   isosorbide mononitrate (IMDUR) 30 MG 24 hr tablet Take 1 tablet (30 mg total) by mouth daily. Please schedule yearly appointment for future refills. Thank you 30 tablet 0   losartan (COZAAR) 50 MG tablet Take 1 tablet (50 mg total) by mouth daily. 30 tablet 0   metFORMIN (GLUCOPHAGE) 1000 MG tablet Take 1 tablet (1,000 mg total) by mouth 2 (two) times daily. 180 tablet 3   nitroGLYCERIN (NITROSTAT) 0.4 MG SL tablet Place 0.4 mg under the tongue every 5 (five) minutes x 3 doses as needed for chest pain.      ONE TOUCH ULTRA TEST test strip CHECK BLOOD SUGAR ONCE DAILY AS DIRECTED  5   pioglitazone (ACTOS) 30 MG tablet Take 1 tablet (30 mg total) by mouth daily. 90 tablet 3   polyethylene glycol (MIRALAX) 17 g packet Take 17 g by mouth 2 (two) times daily. 28 each 3   potassium chloride SA (KLOR-CON) 20 MEQ tablet Take 1 tablet (20 mEq total) by mouth daily. 20 tablet 0   senna-docusate (SENOKOT-S) 8.6-50 MG tablet Take 1 tablet by mouth at bedtime. 30 tablet 2   terazosin (HYTRIN) 5 MG capsule Take 1 capsule (5 mg total) by mouth at  bedtime. 30 capsule 0   traMADol (ULTRAM) 50 MG  tablet Take 1 tablet (50 mg total) by mouth every 6 (six) hours as needed for moderate pain. 28 tablet 0   traZODone (DESYREL) 50 MG tablet Take 1 tablet (50 mg total) by mouth at bedtime as needed for sleep.     No current facility-administered medications for this visit.    REVIEW OF SYSTEMS:  '[X]'$  denotes positive finding, '[ ]'$  denotes negative finding Cardiac  Comments:  Chest pain or chest pressure:    Shortness of breath upon exertion:    Short of breath when lying flat:    Irregular heart rhythm:        Vascular    Pain in calf, thigh, or hip brought on by ambulation: x   Pain in feet at night that wakes you up from your sleep:     Blood clot in your veins:    Leg swelling:         Pulmonary    Oxygen at home:    Productive cough:     Wheezing:         Neurologic    Sudden weakness in arms or legs:     Sudden numbness in arms or legs:     Sudden onset of difficulty speaking or slurred speech:    Temporary loss of vision in one eye:     Problems with dizziness:         Gastrointestinal    Blood in stool:     Vomited blood:         Genitourinary    Burning when urinating:     Blood in urine:        Psychiatric    Major depression:         Hematologic    Bleeding problems:    Problems with blood clotting too easily:        Skin    Rashes or ulcers:        Constitutional    Fever or chills:     PHYSICAL EXAM:   Vitals:   09/16/22 1234  BP: (!) 133/57  Pulse: 67  Resp: 20  Temp: 97.7 F (36.5 C)  SpO2: 96%  Weight: 159 lb (72.1 kg)  Height: '5\' 10"'$  (1.778 m)    GENERAL: The patient is a well-nourished male, in no acute distress. The vital signs are documented above. CARDIAC: There is a regular rate and rhythm.  He has a systolic murmur. VASCULAR: I do not detect carotid bruits. He has palpable femoral pulses. I cannot palpate pedal pulses on the left. PULMONARY: There is good air exchange  bilaterally without wheezing or rales. ABDOMEN: Soft and non-tender with normal pitched bowel sounds.  MUSCULOSKELETAL: He has a right below the knee amputation. NEUROLOGIC: No focal weakness or paresthesias are detected. SKIN: There are no ulcers or rashes noted. PSYCHIATRIC: The patient has a normal affect.  DATA:    ARTERIAL DOPPLER STUDY: I have independently interpreted his arterial Doppler study today.  This was of the left lower extremity only.  He has a monophasic dorsalis pedis and posterior tibial signal.  ABI 78%.  Toe pressures 49 mmHg.  His ABIs improved compared to 70% back in January.  [name] [facility] [phone]

## 2022-09-20 ENCOUNTER — Other Ambulatory Visit: Payer: Self-pay

## 2022-09-20 MED ORDER — DEXCOM G7 SENSOR MISC: 1.0000 | 3 refills | Status: DC

## 2022-09-28 ENCOUNTER — Other Ambulatory Visit: Payer: Self-pay

## 2022-09-28 DIAGNOSIS — I739 Peripheral vascular disease, unspecified: Secondary | ICD-10-CM

## 2022-09-28 DIAGNOSIS — L97909 Non-pressure chronic ulcer of unspecified part of unspecified lower leg with unspecified severity: Secondary | ICD-10-CM

## 2022-10-05 DIAGNOSIS — H353132 Nonexudative age-related macular degeneration, bilateral, intermediate dry stage: Secondary | ICD-10-CM | POA: Diagnosis not present

## 2022-10-05 DIAGNOSIS — E119 Type 2 diabetes mellitus without complications: Secondary | ICD-10-CM | POA: Diagnosis not present

## 2022-10-05 DIAGNOSIS — H353221 Exudative age-related macular degeneration, left eye, with active choroidal neovascularization: Secondary | ICD-10-CM | POA: Diagnosis not present

## 2022-10-05 DIAGNOSIS — H353211 Exudative age-related macular degeneration, right eye, with active choroidal neovascularization: Secondary | ICD-10-CM | POA: Diagnosis not present

## 2022-11-08 ENCOUNTER — Ambulatory Visit: Payer: Medicare Other | Admitting: Internal Medicine

## 2022-11-11 ENCOUNTER — Encounter: Payer: Self-pay | Admitting: Internal Medicine

## 2022-11-11 ENCOUNTER — Ambulatory Visit (INDEPENDENT_AMBULATORY_CARE_PROVIDER_SITE_OTHER): Payer: Medicare Other | Admitting: Internal Medicine

## 2022-11-11 VITALS — BP 110/68 | HR 81 | Ht 70.0 in | Wt 163.0 lb

## 2022-11-11 DIAGNOSIS — D3502 Benign neoplasm of left adrenal gland: Secondary | ICD-10-CM | POA: Diagnosis not present

## 2022-11-11 DIAGNOSIS — H353211 Exudative age-related macular degeneration, right eye, with active choroidal neovascularization: Secondary | ICD-10-CM | POA: Diagnosis not present

## 2022-11-11 DIAGNOSIS — H353133 Nonexudative age-related macular degeneration, bilateral, advanced atrophic without subfoveal involvement: Secondary | ICD-10-CM | POA: Diagnosis not present

## 2022-11-11 DIAGNOSIS — E1165 Type 2 diabetes mellitus with hyperglycemia: Secondary | ICD-10-CM

## 2022-11-11 DIAGNOSIS — Z794 Long term (current) use of insulin: Secondary | ICD-10-CM

## 2022-11-11 DIAGNOSIS — H353221 Exudative age-related macular degeneration, left eye, with active choroidal neovascularization: Secondary | ICD-10-CM | POA: Diagnosis not present

## 2022-11-11 DIAGNOSIS — E119 Type 2 diabetes mellitus without complications: Secondary | ICD-10-CM | POA: Diagnosis not present

## 2022-11-11 LAB — BASIC METABOLIC PANEL
BUN: 21 mg/dL (ref 6–23)
CO2: 28 mEq/L (ref 19–32)
Calcium: 9.9 mg/dL (ref 8.4–10.5)
Chloride: 104 mEq/L (ref 96–112)
Creatinine, Ser: 1.29 mg/dL (ref 0.40–1.50)
GFR: 51.69 mL/min — ABNORMAL LOW (ref >60.00)
Glucose, Bld: 183 mg/dL — ABNORMAL HIGH (ref 70–99)
Potassium: 4.6 mEq/L (ref 3.5–5.1)
Sodium: 145 mEq/L (ref 135–145)

## 2022-11-11 LAB — POCT GLYCOSYLATED HEMOGLOBIN (HGB A1C): Hemoglobin A1C: 7.2 % — AB (ref 4.0–5.6)

## 2022-11-11 MED ORDER — TRESIBA FLEXTOUCH 100 UNIT/ML ~~LOC~~ SOPN: 12.0000 [IU] | PEN_INJECTOR | Freq: Every day | SUBCUTANEOUS | 3 refills | Status: DC

## 2022-11-11 NOTE — Patient Instructions (Addendum)
-   Decrease Tresiba 12 units daily  - Continue Metformin 1000 mg Twice a day  - Continue Jardiance 25 mg daily - Continue  Actos 30 mg , 1 tablet daily   - Novolog correctional insulin:  Use the scale below to help guide you before meals   Blood sugar before meal Number of units to inject  Less than 165 0 unit  166- 200 1 units  201 - 235 2 units  236 - 270 3 units  371 - 305 4 units  306 - 340 5 units  341 - 375 6 units  376 - 410 7 units         HOW TO TREAT LOW BLOOD SUGARS (Blood sugar LESS THAN 70 MG/DL) Please follow the RULE OF 15 for the treatment of hypoglycemia treatment (when your (blood sugars are less than 70 mg/dL)   STEP 1: Take 15 grams of carbohydrates when your blood sugar is low, which includes:  3-4 GLUCOSE TABS  OR 3-4 OZ OF JUICE OR REGULAR SODA OR ONE TUBE OF GLUCOSE GEL    STEP 2: RECHECK blood sugar in 15 MINUTES STEP 3: If your blood sugar is still low at the 15 minute recheck --> then, go back to STEP 1 and treat AGAIN with another 15 grams of carbohydrates.

## 2022-11-11 NOTE — Progress Notes (Signed)
Name: Keith Espinoza  Age/ Sex: 82 y.o., male   MRN/ DOB: [id number], [birth year]     PCP: Kathalene Frames, MD   Reason for Endocrinology Evaluation: Type 2 Diabetes Mellitus  Initial Endocrine Consultative Visit: 03/24/2021    PATIENT IDENTIFIER: [name] is a 82 y.o. male with a past medical history of Right BK amputations due  PVD, T2DM and Pancreatitis .Marland Kitchen The patient has followed with Endocrinology clinic since 03/24/2021 for consultative assistance with management of his diabetes.  DIABETIC HISTORY:  Keith Espinoza was diagnosed with DM [id number], Trulicity caused weight loss, Has been on Glimepiride in the past. His hemoglobin A1c has ranged from 6.6% in 2020, peaking at 7.7% in 2022.     Transitioned care from Dr. Nehemiah Settle in 2022    On his initial visit to our clinic he had an A1c 7.5 % He was on jardiance, Metformin and Basal insulin, which I have reduced basal insulin   Started pioglitazoe 10/2021     ADRENAL HISTORY : Pt found to have a 1 cm left adrenal nodule on CT imaging of abdomen on 01/31/2021. Of note, the pt was noted to have hypokalemia , this is chronic and has been on KCL for ~ 5 yrs      Aldo, renin, 24-hr urinary cortisol and catecholamine have all come back normal 03/2021   SUBJECTIVE:   During the last visit (06/23/2022): A1c 9.0%     Today (11/11/2022): Keith Espinoza is here for a follow up on diabetes management.  He is accompanied by his daughter today.  He checks his blood sugars multiple times daily through CGM. Marland Kitchen The patient has had hypoglycemic episodes since the last clinic visit.  He continues to follow-up with Dr. Sharol Given for right below-knee amputation Patient continues to follow-up with cardiology for CAD  Was seen by vascular 09/16/2022  Denies nausea, vomiting or diarrhea   HOME DIABETES REGIMEN:  Tresiba 14 units daily  Metformin 1000 mg Twice a day  Jardiance 25 mg daily Pioglitazone 30 mg daily  CF: Novolog (BG-130/40)  TIDQAC     Statin: Intolerant to Simvastatin due to severe myalgia  ACE-I/ARB: yes      CONTINUOUS GLUCOSE MONITORING RECORD INTERPRETATION    Dates of Recording: 1/26-11/11/2022  Sensor description:dexcom  Results statistics:   CGM use % of time 93  Average and SD 225/71  Time in range     29   %  % Time Above 180 32  % Time above 250 38  % Time Below target <1   Glycemic patterns summary: BG's optimal at night and high during the day   Hyperglycemic episodes  all day   Hypoglycemic episodes occurred rare at night   Overnight periods: stable and at goal     DIABETIC COMPLICATIONS: Microvascular complications:  Neuropathy, macular degeneration Denies: CKD Last Eye Exam: Completed 04/15/2022  Macrovascular complications:  CAD, PVD Denies: CVA   HISTORY:  Past Medical History:  Past Medical History:  Diagnosis Date   Allergic rhinitis    Allergic rhinitis    Arthritis    Basal cell carcinoma 11/01/2019    bcc left chest treatment TX cx3 74f    Chronic leg pain    right   Chronic lower back pain    Coronary artery disease    a. Stenting to RCA 2004; staged DES to LAD and Cx 2004. DES to mRCA 2012. b. DES to mCx, PTCA to dCx 11/2011. c. Lateral wall MI  2013 s/p PTCA to distal Cx & DES to mid OM2 11/2011. d. Low risk nuc 04/2014, EF wnl.   COVID-19    Diabetes mellitus    Insulin dependent   Diabetic neuropathy ([place])    MILD   Diverticulosis    Dysrhythmia    [name] syndrome    Gout    right wrist; right foot; right elbow; have had it since 1970's   H/O hiatal hernia    Heart murmur    History of echocardiogram    aortic sclerosis per echo 12/09 EF 65%, otherwise normal   History of hemorrhoids    BLEEDING   History of kidney stones    h/o   Hypertension    Diagnosed 1995    Myocardial infarction [facility])    Pancreatic pseudocyst    a. s/p remote drainage 2006.   Thrombocytopenia ([place])    Seen on oldest labs in system from 2004   Vitamin B 12  deficiency    orally replaced   Past Surgical History:  Past Surgical History:  Procedure Laterality Date   ABDOMINAL AORTOGRAM W/LOWER EXTREMITY Bilateral 08/08/2020   Procedure: ABDOMINAL AORTOGRAM W/LOWER EXTREMITY;  Surgeon: Angelia Mould, MD;  Location: [place] [facility];  Service: Cardiovascular;  Laterality: Bilateral;   AMPUTATION Right 12/12/2020   Procedure: RIGHT BELOW KNEE AMPUTATION;  Surgeon: [name], MD;  Location: [place];  Service: Orthopedics;  Laterality: Right;   BACK SURGERY     "total of 3 times" S/P fall    CARPAL TUNNEL RELEASE Bilateral    CHOLECYSTECTOMY  1990's   COLONOSCOPY     CORONARY ANGIOPLASTY  11/11/11   CORONARY ANGIOPLASTY WITH STENT PLACEMENT  09/30/2011   "1 then; makes a total of 4"   CORONARY ANGIOPLASTY WITH STENT PLACEMENT  11/11/11   "1; makes a total of 5"   INGUINAL HERNIA REPAIR  2003   right   JOINT REPLACEMENT Right 04/03/2002   hip replacment   KNEE ARTHROSCOPY  1990's   left   LEFT HEART CATHETERIZATION WITH CORONARY ANGIOGRAM N/A 09/30/2011   Procedure: LEFT HEART CATHETERIZATION WITH CORONARY ANGIOGRAM;  Surgeon: Jettie Booze, MD;  Location: [facility] CATH LAB;  Service: Cardiovascular;  Laterality: N/A;  possible PCI   LEFT HEART CATHETERIZATION WITH CORONARY ANGIOGRAM N/A 11/15/2011   Procedure: LEFT HEART CATHETERIZATION WITH CORONARY ANGIOGRAM;  Surgeon: Jettie Booze, MD;  Location: [facility] CATH LAB;  Service: Cardiovascular;  Laterality: N/A;   PERCUTANEOUS CORONARY STENT INTERVENTION (PCI-S)  09/30/2011   Procedure: PERCUTANEOUS CORONARY STENT INTERVENTION (PCI-S);  Surgeon: Jettie Booze, MD;  Location: [facility] CATH LAB;  Service: Cardiovascular;;   PERCUTANEOUS CORONARY STENT INTERVENTION (PCI-S) N/A 11/11/2011   Procedure: PERCUTANEOUS CORONARY STENT INTERVENTION (PCI-S);  Surgeon: Jettie Booze, MD;  Location: [facility] CATH LAB;  Service: Cardiovascular;  Laterality: N/A;   PERIPHERAL VASCULAR BALLOON ANGIOPLASTY  Right 08/08/2020   Procedure: PERIPHERAL VASCULAR BALLOON ANGIOPLASTY;  Surgeon: Angelia Mould, MD;  Location: [place] CV [facility];  Service: Cardiovascular;  Laterality: Right;  Posterior tibial    SHOULDER SURGERY Right    X 2   STUMP REVISION Right 01/09/2021   Procedure: REVISION RIGHT BELOW KNEE AMPUTATION;  Surgeon: [name], MD;  Location: [place];  Service: Orthopedics;  Laterality: Right;   TONSILLECTOMY  ~ [place] Right 04/13/2019   Procedure: RIGHT TOTAL HIP REVISION-POSTERIOR  APPROACH LATERAL;  Surgeon: Marybelle Killings, MD;  Location: [place];  Service: Orthopedics;  Laterality: Right;   TOTAL KNEE ARTHROPLASTY Left 07/23/2016   Procedure: LEFT TOTAL KNEE ARTHROPLASTY;  Surgeon: Marybelle Killings, MD;  Location: [place];  Service: Orthopedics;  Laterality: Left;   Social History:  reports that he has quit smoking. His smokeless tobacco use includes chew. He reports that he does not drink alcohol and does not use drugs. Family History:  Family History  Problem Relation Age of Onset   Diabetes Mother    Hyperlipidemia Mother    Hypertension Mother    Cancer Father    Hypertension Father    Diabetes Sister    Hypertension Sister    Cancer Brother    Heart attack Neg Hx      HOME MEDICATIONS: Allergies as of 11/11/2022       Reactions   Simvastatin Other (See Comments)   SEVERE MYALGIAS   Zetia [ezetimibe] Other (See Comments)   MYALGIAS   Dilaudid [hydromorphone Hcl] Other (See Comments)   hallucination        Medication List        Accurate as of November 11, 2022 10:32 AM. If you have any questions, ask your nurse or doctor.          STOP taking these medications    traMADol 50 MG tablet Commonly known as: ULTRAM Stopped by: Dorita Sciara, MD   traZODone 50 MG tablet Commonly known as: DESYREL Stopped by: Dorita Sciara, MD       TAKE these medications    acetaminophen 500 MG tablet Commonly known as:  TYLENOL Take 1,000 mg by mouth every 6 (six) hours as needed (pain).   allopurinol 300 MG tablet Commonly known as: ZYLOPRIM Take 300 mg by mouth daily.   amLODipine 2.5 MG tablet Commonly known as: NORVASC Take 1 tablet (2.5 mg total) by mouth daily.   aspirin EC 81 MG tablet Take 81 mg by mouth daily.   B-12 2500 MCG Tabs Take 2,500 mcg by mouth daily.   clopidogrel 75 MG tablet Commonly known as: PLAVIX Take 1 tablet (75 mg total) by mouth daily.   Dexcom G7 Sensor Misc 1 Device by Does not apply route as directed.   empagliflozin 25 MG Tabs tablet Commonly known as: JARDIANCE Take 1 tablet (25 mg total) by mouth daily.   gabapentin 400 MG capsule Commonly known as: NEURONTIN Take 1 capsule (400 mg total) by mouth 3 (three) times daily. What changed: when to take this   gabapentin 100 MG capsule Commonly known as: NEURONTIN Take 2 capsules (200 mg total) by mouth at bedtime. What changed: how much to take   hydrochlorothiazide 12.5 MG tablet Commonly known as: HYDRODIURIL Take 1 tablet (12.5 mg total) by mouth daily.   Insulin Pen Needle 32G X 4 MM Misc 1 Device by Does not apply route in the morning, at noon, in the evening, and at bedtime.   isosorbide mononitrate 30 MG 24 hr tablet Commonly known as: IMDUR Take 1 tablet (30 mg total) by mouth daily. Please schedule yearly appointment for future refills. Thank you   losartan 50 MG tablet Commonly known as: COZAAR Take 1 tablet (50 mg total) by mouth daily. What changed: how much to take   metFORMIN 1000 MG tablet Commonly known as: GLUCOPHAGE Take 1 tablet (1,000 mg total) by mouth 2 (two) times daily.   nitroGLYCERIN 0.4 MG SL tablet Commonly known as: NITROSTAT Place 0.4 mg under the tongue every 5 (five) minutes x 3  doses as needed for chest pain.   NovoLOG FlexPen 100 UNIT/ML FlexPen Generic drug: insulin aspart Max daily 30 units   ONE TOUCH ULTRA TEST test strip Generic drug: glucose  blood CHECK BLOOD SUGAR ONCE DAILY AS DIRECTED   pioglitazone 30 MG tablet Commonly known as: Actos Take 1 tablet (30 mg total) by mouth daily.   polyethylene glycol 17 g packet Commonly known as: MiraLax Take 17 g by mouth 2 (two) times daily.   potassium chloride SA 20 MEQ tablet Commonly known as: KLOR-CON M Take 1 tablet (20 mEq total) by mouth daily.   senna-docusate 8.6-50 MG tablet Commonly known as: Senokot-S Take 1 tablet by mouth at bedtime.   terazosin 5 MG capsule Commonly known as: HYTRIN Take 1 capsule (5 mg total) by mouth at bedtime. What changed: how much to take   Tresiba FlexTouch 100 UNIT/ML FlexTouch Pen Generic drug: insulin degludec Inject 14 Units into the skin daily.   Vitamin D3 50 MCG (2000 UT) Tabs Take 2,000 Units by mouth daily.         OBJECTIVE:   Vital Signs:BP 110/68 (BP Location: Left Arm, Patient Position: Sitting, Cuff Size: Small)   Pulse 81   Ht 5' 10"$  (1.778 m)   Wt 163 lb (73.9 kg)   SpO2 98%   BMI 23.39 kg/m   Wt Readings from Last 3 Encounters:  11/11/22 163 lb (73.9 kg)  09/16/22 159 lb (72.1 kg)  06/23/22 158 lb (71.7 kg)     Exam: General: Pt appears well and is in NAD  Neck: General: Supple without adenopathy. Thyroid: Thyroid size normal.  No goiter or nodules appreciated.   Lungs: Clear with good BS bilat with no rales, rhonchi, or wheezes  Heart: RRR  Neuro: MS is good with appropriate affect, pt is alert and Ox3     DATA REVIEWED:  Lab Results  Component Value Date   HGBA1C 9.0 (A) 06/23/2022   HGBA1C 8.3 (A) 10/06/2021   HGBA1C 7.5 (A) 06/30/2021    Latest Reference Range & Units 11/11/22 10:56  Sodium 135 - 145 mEq/L 145  Potassium 3.5 - 5.1 mEq/L 4.6  Chloride 96 - 112 mEq/L 104  CO2 19 - 32 mEq/L 28  Glucose 70 - 99 mg/dL 183 (H)  BUN 6 - 23 mg/dL 21  Creatinine 0.40 - 1.50 mg/dL 1.29  Calcium 8.4 - 10.5 mg/dL 9.9  GFR >60.00 mL/min 51.69 (L)  (H): Data is abnormally high (L): Data  is abnormally low  CT abdomen 01/31/2021  . Large amount of retained stool throughout the colon consistent with constipation. Large amount of stool within the rectal vault consistent with fecal impaction. 2. Punctate bilateral nonobstructing renal calculi. 3. Indeterminate 10 mm left adrenal nodule, likely adenoma. 4.  Aortic Atherosclerosis (ICD10-I70.0).   ASSESSMENT / PLAN / RECOMMENDATIONS:   1) Type 2 Diabetes Mellitus, Optimally controlled, With neuropathic and macrovascular  complications - Most recent A1c of 7.2 %. Goal A1c < 7.5 %.    -A1c has trended down from 9.0% to 7.2% -He has been noted with continued postprandial hyperglycemia, I will adjust his correction scale as the patient does not it takes too long for his glucose to trend down -Patient has been noted with hypoglycemia at night, will reduce his basal insulin as below - Pt with hx of pancreatitis , hence DPP-4 inhibitors and GLP-1 agonists are contra-indicated.      MEDICATIONS: Decrease Tresiba  12  units daily  Continue Metformin  1000 mg Twice a day  Continue Jardiance 25 mg daily Continue pioglitazone 30 mg daily  Change correction factor: NovoLog (BG -130/40) with each meal    EDUCATION / INSTRUCTIONS: BG monitoring instructions: Patient is instructed to check his blood sugars 1 times a day, fasting . Call [place] [facility] if: BG persistently < 70  I reviewed the Rule of 15 for the treatment of hypoglycemia in detail with the patient. Literature supplied.    2) Diabetic complications:  Eye:  Unknown to have diabetic retinopathy.  Neuro/ Feet: Does  have known diabetic peripheral neuropathy .  Renal: Patient does not have known baseline CKD. He   is  on an ACEI/ARB at present.    3) Left Adrenal Adenoma:   - Hormonal workup has ruled out cushing syndrome (2022), Pheo and hyperaldo 06/2022 , patient has been noted with slight elevation of plasma normetanephrine, we will continue to  monitor and repeat through urine in the future  - Will repeat adrenal imaging    F/U in 4 months     Signed electronically by: Mack Guise, MD  [facility]  [address] [place], [place] [address] Phone: [phone] FAX: [phone]   CC: Kathalene Frames, MD [address], [address] [place] [address] Phone: [phone]  Fax: [phone]  Return to Endocrinology clinic as below: Future Appointments  Date Time Provider [place]  11/11/2022 10:50 AM Salil Raineri, Melanie Crazier, MD [id number] None

## 2022-11-12 ENCOUNTER — Encounter: Payer: Self-pay | Admitting: Internal Medicine

## 2022-11-30 DIAGNOSIS — H353211 Exudative age-related macular degeneration, right eye, with active choroidal neovascularization: Secondary | ICD-10-CM | POA: Diagnosis not present

## 2022-11-30 DIAGNOSIS — E119 Type 2 diabetes mellitus without complications: Secondary | ICD-10-CM | POA: Diagnosis not present

## 2022-11-30 DIAGNOSIS — H353221 Exudative age-related macular degeneration, left eye, with active choroidal neovascularization: Secondary | ICD-10-CM | POA: Diagnosis not present

## 2022-12-13 ENCOUNTER — Other Ambulatory Visit: Payer: Medicare Other

## 2022-12-23 ENCOUNTER — Ambulatory Visit
Admission: RE | Admit: 2022-12-23 | Discharge: 2022-12-23 | Disposition: A | Discharge status: Home / Self Care | Payer: Medicare Other | Source: Ambulatory Visit | Attending: Internal Medicine | Admitting: Internal Medicine

## 2022-12-23 DIAGNOSIS — Z9049 Acquired absence of other specified parts of digestive tract: Secondary | ICD-10-CM | POA: Diagnosis not present

## 2022-12-23 DIAGNOSIS — D3502 Benign neoplasm of left adrenal gland: Secondary | ICD-10-CM

## 2022-12-23 DIAGNOSIS — E279 Disorder of adrenal gland, unspecified: Secondary | ICD-10-CM | POA: Diagnosis not present

## 2022-12-27 ENCOUNTER — Encounter: Payer: Self-pay | Admitting: Internal Medicine

## 2023-01-07 ENCOUNTER — Telehealth: Payer: Self-pay

## 2023-01-07 NOTE — Telephone Encounter (Signed)
Clinical notes faxed to [facility] through Epic per fax request.  

## 2023-01-19 DIAGNOSIS — L03113 Cellulitis of right upper limb: Secondary | ICD-10-CM | POA: Diagnosis not present

## 2023-01-24 DIAGNOSIS — H353221 Exudative age-related macular degeneration, left eye, with active choroidal neovascularization: Secondary | ICD-10-CM | POA: Diagnosis not present

## 2023-01-24 DIAGNOSIS — H353211 Exudative age-related macular degeneration, right eye, with active choroidal neovascularization: Secondary | ICD-10-CM | POA: Diagnosis not present

## 2023-01-24 DIAGNOSIS — H353133 Nonexudative age-related macular degeneration, bilateral, advanced atrophic without subfoveal involvement: Secondary | ICD-10-CM | POA: Diagnosis not present

## 2023-01-24 DIAGNOSIS — E113393 Type 2 diabetes mellitus with moderate nonproliferative diabetic retinopathy without macular edema, bilateral: Secondary | ICD-10-CM | POA: Diagnosis not present

## 2023-01-25 DIAGNOSIS — H353211 Exudative age-related macular degeneration, right eye, with active choroidal neovascularization: Secondary | ICD-10-CM | POA: Diagnosis not present

## 2023-01-25 DIAGNOSIS — H353133 Nonexudative age-related macular degeneration, bilateral, advanced atrophic without subfoveal involvement: Secondary | ICD-10-CM | POA: Diagnosis not present

## 2023-01-25 DIAGNOSIS — E113393 Type 2 diabetes mellitus with moderate nonproliferative diabetic retinopathy without macular edema, bilateral: Secondary | ICD-10-CM | POA: Diagnosis not present

## 2023-01-25 DIAGNOSIS — H353221 Exudative age-related macular degeneration, left eye, with active choroidal neovascularization: Secondary | ICD-10-CM | POA: Diagnosis not present

## 2023-02-16 ENCOUNTER — Other Ambulatory Visit: Payer: Self-pay

## 2023-02-16 ENCOUNTER — Emergency Department (HOSPITAL_BASED_OUTPATIENT_CLINIC_OR_DEPARTMENT_OTHER)
Admission: EM | Admit: 2023-02-16 | Discharge: 2023-02-16 | Disposition: A | Discharge status: Home / Self Care | Payer: Medicare Other | Attending: Emergency Medicine | Admitting: Emergency Medicine

## 2023-02-16 DIAGNOSIS — Z79899 Other long term (current) drug therapy: Secondary | ICD-10-CM | POA: Insufficient documentation

## 2023-02-16 DIAGNOSIS — I2510 Atherosclerotic heart disease of native coronary artery without angina pectoris: Secondary | ICD-10-CM | POA: Insufficient documentation

## 2023-02-16 DIAGNOSIS — K5900 Constipation, unspecified: Secondary | ICD-10-CM | POA: Insufficient documentation

## 2023-02-16 DIAGNOSIS — Z7984 Long term (current) use of oral hypoglycemic drugs: Secondary | ICD-10-CM | POA: Diagnosis not present

## 2023-02-16 DIAGNOSIS — I10 Essential (primary) hypertension: Secondary | ICD-10-CM | POA: Insufficient documentation

## 2023-02-16 DIAGNOSIS — Z7982 Long term (current) use of aspirin: Secondary | ICD-10-CM | POA: Diagnosis not present

## 2023-02-16 DIAGNOSIS — Z794 Long term (current) use of insulin: Secondary | ICD-10-CM | POA: Insufficient documentation

## 2023-02-16 DIAGNOSIS — E119 Type 2 diabetes mellitus without complications: Secondary | ICD-10-CM | POA: Insufficient documentation

## 2023-02-16 NOTE — ED Provider Notes (Signed)
[place] EMERGENCY DEPARTMENT AT [facility] Provider Note   CSN: [id number] Arrival date & time: 02/16/23  1435     History  Chief Complaint  Patient presents with   Constipation    Keith Espinoza is a 82 y.o. male.  Patient is a 82 year old male who presents with constipation.  He has a history of diabetes, hypertension, coronary artery disease and right below the knee amputation who presents with inability to have a bowel movement in the last  days.  He has a urged and feels like there is stool in his rectum but it does not come out.  He denies any abdominal pain.  No nausea or vomiting.  No fevers.  No urinary symptoms.  He reports he had similar symptoms about 3 years ago when he was taking some pain medication after his knee amputation.  He normally takes a stool softener daily.  He typically has a bowel movement every couple of days.  He has not had a bowel movement in last 3 days but he did have 1 daily for 4 days prior to that.       Home Medications Prior to Admission medications   Medication Sig Start Date End Date Taking? Authorizing Provider  acetaminophen (TYLENOL) 500 MG tablet Take 1,000 mg by mouth every 6 (six) hours as needed (pain).    [provider]  allopurinol (ZYLOPRIM) 300 MG tablet Take 300 mg by mouth daily.    [provider]  amLODipine (NORVASC) 2.5 MG tablet Take 1 tablet (2.5 mg total) by mouth daily. Patient not taking: Reported on 11/11/2022 04/15/22   Corky Crafts, MD  aspirin EC 81 MG tablet Take 81 mg by mouth daily.    [provider]  Cholecalciferol (VITAMIN D3) 50 MCG (2000 UT) TABS Take 2,000 Units by mouth daily.     [provider]  clopidogrel (PLAVIX) 75 MG tablet Take 1 tablet (75 mg total) by mouth daily. 12/26/20   Love, Evlyn Kanner, PA-C  Continuous Blood Gluc Sensor (DEXCOM G7 SENSOR) MISC 1 Device by Does not apply route as directed. 09/20/22   [name], Konrad Dolores, MD   Cyanocobalamin (B-12) 2500 MCG TABS Take 2,500 mcg by mouth daily.    [provider]  empagliflozin (JARDIANCE) 25 MG TABS tablet Take 1 tablet (25 mg total) by mouth daily. 02/12/22   [name], Konrad Dolores, MD  gabapentin (NEURONTIN) 100 MG capsule Take 2 capsules (200 mg total) by mouth at bedtime. Patient taking differently: Take 600 mg by mouth at bedtime. 12/26/20   Love, Evlyn Kanner, PA-C  gabapentin (NEURONTIN) 400 MG capsule Take 1 capsule (400 mg total) by mouth 3 (three) times daily. Patient taking differently: Take 400 mg by mouth daily. 12/26/20   Love, Evlyn Kanner, PA-C  hydrochlorothiazide (HYDRODIURIL) 12.5 MG tablet Take 1 tablet (12.5 mg total) by mouth daily. 12/26/20   Love, Evlyn Kanner, PA-C  insulin aspart (NOVOLOG FLEXPEN) 100 UNIT/ML FlexPen Max daily 30 units 06/23/22   [name], Konrad Dolores, MD  insulin degludec (TRESIBA FLEXTOUCH) 100 UNIT/ML FlexTouch Pen Inject 12 Units into the skin daily. 11/11/22   [name], Konrad Dolores, MD  Insulin Pen Needle 32G X 4 MM MISC 1 Device by Does not apply route in the morning, at noon, in the evening, and at bedtime. 06/23/22   [name], Konrad Dolores, MD  isosorbide mononitrate (IMDUR) 30 MG 24 hr tablet Take 1 tablet (30 mg total) by mouth daily. Please schedule yearly appointment for  future refills. Thank you 12/26/20   Love, Evlyn Kanner, PA-C  losartan (COZAAR) 50 MG tablet Take 1 tablet (50 mg total) by mouth daily. Patient taking differently: Take 25 mg by mouth daily. 12/26/20   Love, Evlyn Kanner, PA-C  metFORMIN (GLUCOPHAGE) 1000 MG tablet Take 1 tablet (1,000 mg total) by mouth 2 (two) times daily. 02/12/22   [name], Konrad Dolores, MD  nitroGLYCERIN (NITROSTAT) 0.4 MG SL tablet Place 0.4 mg under the tongue every 5 (five) minutes x 3 doses as needed for chest pain.     [provider]  ONE TOUCH ULTRA TEST test strip CHECK BLOOD SUGAR ONCE DAILY AS DIRECTED 09/23/17   [provider]  pioglitazone  (ACTOS) 30 MG tablet Take 1 tablet (30 mg total) by mouth daily. 02/12/22   [name], Konrad Dolores, MD  polyethylene glycol (MIRALAX) 17 g packet Take 17 g by mouth 2 (two) times daily. 02/03/21   Vassie Loll, MD  potassium chloride SA (KLOR-CON) 20 MEQ tablet Take 1 tablet (20 mEq total) by mouth daily. 02/03/21   Vassie Loll, MD  senna-docusate (SENOKOT-S) 8.6-50 MG tablet Take 1 tablet by mouth at bedtime. 02/03/21   Vassie Loll, MD  terazosin (HYTRIN) 5 MG capsule Take 1 capsule (5 mg total) by mouth at bedtime. Patient taking differently: Take 10 mg by mouth at bedtime. 12/26/20   Love, Evlyn Kanner, PA-C      Allergies    Simvastatin, Zetia [ezetimibe], and Dilaudid [hydromorphone hcl]    Review of Systems   Review of Systems  Constitutional:  Negative for chills, diaphoresis, fatigue and fever.  HENT:  Negative for congestion, rhinorrhea and sneezing.   Eyes: Negative.   Respiratory:  Negative for cough, chest tightness and shortness of breath.   Cardiovascular:  Negative for chest pain and leg swelling.  Gastrointestinal:  Positive for constipation. Negative for abdominal pain, blood in stool, diarrhea, nausea and vomiting.  Genitourinary:  Negative for difficulty urinating, flank pain, frequency and hematuria.  Musculoskeletal:  Negative for arthralgias and back pain.  Skin:  Negative for rash.  Neurological:  Negative for dizziness, speech difficulty, weakness, numbness and headaches.    Physical Exam Updated Vital Signs BP (!) 183/73   Pulse (!) 59   Temp 97.6 F (36.4 C) (Oral)   Resp 18   Ht 5\' 10"  (1.778 m)   Wt 74.8 kg   SpO2 97%   BMI 23.68 kg/m  Physical Exam Constitutional:      Appearance: He is well-developed.  HENT:     Head: Normocephalic and atraumatic.  Eyes:     Pupils: Pupils are equal, round, and reactive to light.  Cardiovascular:     Rate and Rhythm: Normal rate and regular rhythm.     Heart sounds: Murmur heard.  Pulmonary:     Effort:  Pulmonary effort is normal. No respiratory distress.     Breath sounds: Normal breath sounds. No wheezing or rales.  Chest:     Chest wall: No tenderness.  Abdominal:     General: Bowel sounds are normal.     Palpations: Abdomen is soft.     Tenderness: There is no abdominal tenderness. There is no guarding or rebound.  Genitourinary:    Comments: Rectal exam done with nursing tech as chaperone.  There is large amount of soft stool in the rectal vault, no rectal tenderness Musculoskeletal:        General: Normal range of motion.     Cervical back: Normal  range of motion and neck supple.     Comments: Right lower leg amputation  Lymphadenopathy:     Cervical: No cervical adenopathy.  Skin:    General: Skin is warm and dry.     Findings: No rash.  Neurological:     Mental Status: He is alert and oriented to person, place, and time.     ED Results / Procedures / Treatments   Labs (all labs ordered are listed, but only abnormal results are displayed) Labs Reviewed - No data to display  EKG None  Radiology No results found.  Procedures Procedures    Medications Ordered in ED Medications - No data to display  ED Course/ Medical Decision Making/ A&P                             Medical Decision Making  Patient is a 82 year old male who presents with constipation.  Rectal exam did show a large amount of soft stool in the rectal vault.  He does not have any associated abdominal pain.  No vomiting.  No other symptoms that sound concerning for bowel obstruction.  I do not see an indication for imaging at this point.  He had a soapsuds enema and his symptoms completely resolved after this.  He had a large bowel movement and feels much better.  He was ambulating around the ED and is ready to go.  He was discharged home in good condition.  He was encouraged to use MiraLAX for the next few days.  Return precautions were given.  Final Clinical Impression(s) / ED Diagnoses Final  diagnoses:  Constipation, unspecified constipation type    Rx / DC Orders ED Discharge Orders     None         Rolan Bucco, MD 02/16/23 1636

## 2023-02-16 NOTE — ED Notes (Signed)
Pt verbalized understanding of d/c instructions, meds, and followup care. Denies questions. VSS, no distress noted. Steady gait to exit with all belongings.  ?

## 2023-02-16 NOTE — ED Triage Notes (Signed)
Pt arrived POV, ambulatory in triage. Pt caox4 c/o constipation. Pt reports LBM 3 days ago. Pt reports last hospitalization for same was 3 years ago. Pt took Magnesium citrate approx 2 hrs ago with no relief. Denies abd pain and N/V at present. Hypertensive, did not take meds this morning.

## 2023-02-21 ENCOUNTER — Other Ambulatory Visit: Payer: Self-pay

## 2023-02-21 ENCOUNTER — Ambulatory Visit (INDEPENDENT_AMBULATORY_CARE_PROVIDER_SITE_OTHER): Payer: Medicare Other | Admitting: Surgical

## 2023-02-21 ENCOUNTER — Encounter: Payer: Self-pay | Admitting: Surgical

## 2023-02-21 ENCOUNTER — Emergency Department (HOSPITAL_COMMUNITY): Payer: Medicare Other

## 2023-02-21 ENCOUNTER — Emergency Department (HOSPITAL_COMMUNITY)
Admission: EM | Admit: 2023-02-21 | Discharge: 2023-02-21 | Discharge status: Against Medical Advice | Payer: Medicare Other | Attending: Emergency Medicine | Admitting: Emergency Medicine

## 2023-02-21 DIAGNOSIS — S7001XA Contusion of right hip, initial encounter: Secondary | ICD-10-CM

## 2023-02-21 DIAGNOSIS — W1830XA Fall on same level, unspecified, initial encounter: Secondary | ICD-10-CM | POA: Diagnosis not present

## 2023-02-21 DIAGNOSIS — Z049 Encounter for examination and observation for unspecified reason: Secondary | ICD-10-CM | POA: Diagnosis not present

## 2023-02-21 DIAGNOSIS — Z5321 Procedure and treatment not carried out due to patient leaving prior to being seen by health care provider: Secondary | ICD-10-CM | POA: Insufficient documentation

## 2023-02-21 DIAGNOSIS — Z96641 Presence of right artificial hip joint: Secondary | ICD-10-CM | POA: Diagnosis not present

## 2023-02-21 DIAGNOSIS — M25551 Pain in right hip: Secondary | ICD-10-CM | POA: Diagnosis not present

## 2023-02-21 DIAGNOSIS — R42 Dizziness and giddiness: Secondary | ICD-10-CM | POA: Diagnosis not present

## 2023-02-21 DIAGNOSIS — R9431 Abnormal electrocardiogram [ECG] [EKG]: Secondary | ICD-10-CM | POA: Diagnosis not present

## 2023-02-21 MED ORDER — LIDOCAINE HCL 1 % IJ SOLN
5.0000 mL | INTRAMUSCULAR | Status: AC | PRN
Start: 2023-02-21 — End: 2023-02-21
  Administered 2023-02-21: 5 mL

## 2023-02-21 NOTE — Progress Notes (Signed)
Office Visit Note   Patient: Keith Espinoza           Date of Birth: 05-Sep-1941           MRN: [id number] Visit Date: 02/21/2023 Requested by: Emilio Aspen, MD [address], [address] [place],  [place] [address] PCP: Emilio Aspen, MD  Subjective: Chief Complaint  Patient presents with   Right Leg - Pain    HPI: Keith Espinoza is a 82 y.o. male who presents to the office reporting right hip pain.  Patient has history of right hip replacement that was done by Dr. Ophelia Charter about 2 decades ago.  He had recurrent dislocation of the prosthesis that necessitated liner placement for instability.  This was about 3 to 4 years ago.  He has done well with this hip overall since then.  He ambulates with prosthesis due to BKA.  States that he stumbled over a roomba vacuum yesterday and fell with a lot of force into the lateral aspect of his hip onto the ground.  He has had increased pain in the lateral aspect of the hip with a small amount of groin pain since yesterday.  Did not really bother him when he was sleeping last night he was able to sleep through the night.  He has not noticed any ecchymosis or bruising.  No loss of consciousness.  Takes aspirin and Plavix.  He has not been able to weight-bear with the use of his prosthesis due to the lateral hip pain that he has.  He went to the emergency department earlier today where he had radiographs demonstrating no fracture or dislocation.  He is here today for evaluation after leaving the ER..                ROS: All systems reviewed are negative as they relate to the chief complaint within the history of present illness.  Patient denies fevers or chills.  Assessment & Plan: Visit Diagnoses:  1. Hip hematoma, right, initial encounter   2. Presence of right artificial hip joint     Plan: Patient is an 82 year old male who presents for evaluation of right hip pain.  Had a fall onto the lateral aspect of the hip and has had  difficulty bearing weight on the affected side ever since the fall yesterday.  Radiographs are negative for any acute changes.  He had ultrasound examination today demonstrating hypoechoic fluid with large fluid collection in the trochanteric bursa.  The gluteal tendinous attachment on the anterior aspect of the trochanter appeared intact but on the posterior aspect of the trochanter, there was some hypoechogenicity which along with the "divot" in this region is concerning for gluteal tendon tear which may or may not be acute.  Discussed options available patient such as further evaluation with advanced imaging +/- aspiration of this fluid.  Seems that with his being on aspirin/Plavix, this could evolve into a hematoma so after we discussed this, he would like to have this fluid drawn off.  We did discuss the remote risk of introducing infection into the prosthesis but he would like to proceed regardless.  Combination of alcohol, Betadine, ChloraPrep was applied to the area of the trochanter and the area was anesthetized with 5 cc of lidocaine with the use of sterile gloves.  18-gauge spinal needle was introduced and about 32 cc of serosanguineous fluid was aspirated.  Hopefully this will provide some relief for him.  Pictures were taken of the trochanter  and fluid collection.  We will get CT scan urgently to rule out occult periprosthetic fracture especially with his pain with passive motion of the hip.  Follow-up after CT scan.  Follow-Up Instructions: No follow-ups on file.   Orders:  Orders Placed This Encounter  Procedures   US Guided Needle Placement - No Linked Charges   CT HIP RIGHT WO CONTRAST   No orders of the defined types were placed in this encounter.     Procedures: Large Joint Inj: R greater trochanter on 02/21/2023 8:26 PM Indications: pain and diagnostic evaluation Details: 18 G 3.5 in needle, ultrasound-guided lateral approach  Arthrogram: No  Medications: 5 mL lidocaine 1  % Aspirate: 32 mL bloody and serous Outcome: tolerated well, no immediate complications Procedure, treatment alternatives, risks and benefits explained, specific risks discussed. Consent was given by the patient. Immediately prior to procedure a time out was called to verify the correct patient, procedure, equipment, support staff and site/side marked as required. Patient was prepped and draped in the usual sterile fashion.       Clinical Data: No additional findings.  Objective: Vital Signs: There were no vitals taken for this visit.  Physical Exam:  Constitutional: Patient appears well-developed HEENT:  Head: Normocephalic Eyes:EOM are normal Neck: Normal range of motion Cardiovascular: Normal rate Pulmonary/chest: Effort normal Neurologic: Patient is alert Skin: Skin is warm Psychiatric: Patient has normal mood and affect  Ortho Exam: Ortho exam demonstrates right hip with well-healed incision over the posterior aspect of the hip.  There is a "divot" along the midportion of the incision where the trochanter is fairly prominent.  There is no dehiscence of the incision.  No evidence of cellulitis or skin changes.  No ecchymosis.  He has intact hip flexion and knee extension.  He does have significant pain with passive motion of the hip on exam today.  There is no obvious deformity noted to the hip as far as appearing dislocated.  Specialty Comments:  No specialty comments available.  Imaging: DG Hip Unilat W or Wo Pelvis 2-3 Views Right  Result Date: 02/21/2023 CLINICAL DATA:  Fall.  Prior hip replacement.  Tripped over roomba. EXAM: DG HIP (WITH OR WITHOUT PELVIS) 2-3V RIGHT COMPARISON:  CT of the abdomen and pelvis on 01/31/2021, acute abdomen 01/30/2021 FINDINGS: Prior RIGHT hip arthroplasty is unremarkable in appearance, without dislocation or evidence for hardware failure. There is degenerative change in the LEFT hip and LOWER lumbar spine. No acute fracture or subluxation.  Atherosclerotic calcification is present in the femoral arteries. IMPRESSION: Status post RIGHT hip arthroplasty. No evidence for acute abnormality. Electronically Signed   By: Norva Pavlov M.D.   On: 02/21/2023 13:26     PMFS History: Patient Active Problem List   Diagnosis Date Noted   Type 2 diabetes mellitus with hyperglycemia, with long-term current use of insulin (HCC) 02/15/2022   Adenoma of left adrenal gland 02/15/2022   History of right below knee amputation (HCC) 10/06/2021   Exudative age-related macular degeneration of left eye with active choroidal neovascularization (HCC) 07/28/2021   Exudative age-related macular degeneration of right eye with active choroidal neovascularization (HCC) 06/01/2021   Intermediate stage nonexudative age-related macular degeneration of both eyes 06/01/2021   Type 2 diabetes mellitus with diabetic polyneuropathy, with long-term current use of insulin (HCC) 03/24/2021   Hypokalemia    Constipation 02/02/2021   Fecal impaction (HCC) 02/01/2021   Wound dehiscence 01/09/2021   Dehiscence of amputation stump (HCC)    Status post  percutaneous transluminal coronary angioplasty 01/06/2021   Type II diabetes mellitus, uncontrolled 01/06/2021   Acute pancreatitis 01/06/2021   Diabetic peripheral vascular disease (HCC) 01/06/2021   Encounter for screening for other disorder 01/06/2021   Enlarged prostate 01/06/2021   Foot ulcer, right (HCC) 01/06/2021   Gout 01/06/2021   Headache 01/06/2021   Neck pain 01/06/2021   Hypoglycemia 01/06/2021   Loss of appetite 01/06/2021   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   Peripheral neuropathy 01/06/2021   Sciatica 01/06/2021   Vitamin B12 deficiency 01/06/2021   Vitamin D deficiency 01/06/2021   Weakness 01/06/2021   Diabetes mellitus type 2 with neurological manifestations (HCC) 01/06/2021   Protein-calorie malnutrition, severe 12/26/2020   Acute blood loss anemia 12/26/2020   Prerenal azotemia  12/26/2020   Below-knee amputation of right lower extremity (HCC) 12/12/2020   Gangrene of right foot (HCC)    Diabetic neuropathy (HCC) 11/17/2020   Hyperglycemia due to type 2 diabetes mellitus (HCC) 11/17/2020   Long term (current) use of insulin (HCC) 11/17/2020   Obesity 11/17/2020   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/2019   Hip dislocation, right (HCC) 04/13/2019   Other intervertebral disc degeneration, lumbar region 03/30/2019   CAD (coronary artery disease) 01/30/2019   Tobacco abuse 01/30/2019   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   Burn, foot, second degree, left, initial encounter 06/08/2017   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 03/16/2017   S/P total knee arthroplasty, left 10/26/2016   Hyperlipidemia 09/04/2014   Thrombocytopenia (HCC)    Precordial chest pain 04/05/2014   Coronary atherosclerosis of native coronary artery 10/01/2013   Other and unspecified hyperlipidemia 10/01/2013   Primary hypertension 10/01/2013   Diabetes mellitus (HCC) 10/01/2013   Esophageal reflux 10/01/2013   Hypertrophy of prostate without urinary obstruction and other lower urinary tract symptoms (LUTS) 10/01/2013   Past Medical History:  Diagnosis Date   Allergic rhinitis    Allergic rhinitis    Arthritis    Basal cell carcinoma 11/01/2019    bcc left chest treatment [place] cx3 [id number]    Chronic leg pain    right   Chronic lower back pain    Coronary artery disease    a. Stenting to RCA 2004; staged DES to LAD and Cx 2004. DES to [facility] 2012. b. DES to mCx, PTCA to dCx 11/2011. c. Lateral wall MI 2013 s/p PTCA to distal Cx & DES to mid OM2 11/2011. d. Low risk nuc 04/2014, EF wnl.   COVID-19    Diabetes mellitus    Insulin dependent   Diabetic neuropathy (HCC)    MILD   Diverticulosis    Dysrhythmia    [name] syndrome    Gout    right wrist; right foot; right elbow; have had it since 1970's   H/O hiatal hernia    Heart murmur    History of echocardiogram     aortic sclerosis per echo 12/09 EF 65%, otherwise normal   History of hemorrhoids    BLEEDING   History of kidney stones    h/o   Hypertension    Diagnosed 1995    Myocardial infarction [facility])    Pancreatic pseudocyst    a. s/p remote drainage 2006.   Thrombocytopenia (HCC)    Seen on oldest labs in system from 2004   Vitamin B 12 deficiency    orally replaced    Family History  Problem Relation Age of Onset   Diabetes Mother    Hyperlipidemia Mother  Hypertension Mother    Cancer Father    Hypertension Father    Diabetes Sister    Hypertension Sister    Cancer Brother    Heart attack Neg Hx     Past Surgical History:  Procedure Laterality Date   ABDOMINAL AORTOGRAM W/LOWER EXTREMITY Bilateral 08/08/2020   Procedure: ABDOMINAL AORTOGRAM W/LOWER EXTREMITY;  Surgeon: Chuck Hint, MD;  Location: [facility] INVASIVE CV LAB;  Service: Cardiovascular;  Laterality: Bilateral;   AMPUTATION Right 12/12/2020   Procedure: RIGHT BELOW KNEE AMPUTATION;  Surgeon: Nadara Mustard, MD;  Location: [facility] OR;  Service: Orthopedics;  Laterality: Right;   BACK SURGERY     "total of 3 times" S/P fall    CARPAL TUNNEL RELEASE Bilateral    CHOLECYSTECTOMY  1990's   COLONOSCOPY     CORONARY ANGIOPLASTY  11/11/11   CORONARY ANGIOPLASTY WITH STENT PLACEMENT  09/30/2011   "1 then; makes a total of 4"   CORONARY ANGIOPLASTY WITH STENT PLACEMENT  11/11/11   "1; makes a total of 5"   INGUINAL HERNIA REPAIR  2003   right   JOINT REPLACEMENT Right 04/03/2002   hip replacment   KNEE ARTHROSCOPY  1990's   left   LEFT HEART CATHETERIZATION WITH CORONARY ANGIOGRAM N/A 09/30/2011   Procedure: LEFT HEART CATHETERIZATION WITH CORONARY ANGIOGRAM;  Surgeon: Corky Crafts, MD;  Location: [facility] CATH LAB;  Service: Cardiovascular;  Laterality: N/A;  possible PCI   LEFT HEART CATHETERIZATION WITH CORONARY ANGIOGRAM N/A 11/15/2011   Procedure: LEFT HEART CATHETERIZATION WITH CORONARY ANGIOGRAM;  Surgeon: Corky Crafts, MD;  Location: [facility] CATH LAB;  Service: Cardiovascular;  Laterality: N/A;   PERCUTANEOUS CORONARY STENT INTERVENTION (PCI-S)  09/30/2011   Procedure: PERCUTANEOUS CORONARY STENT INTERVENTION (PCI-S);  Surgeon: Corky Crafts, MD;  Location: [facility] CATH LAB;  Service: Cardiovascular;;   PERCUTANEOUS CORONARY STENT INTERVENTION (PCI-S) N/A 11/11/2011   Procedure: PERCUTANEOUS CORONARY STENT INTERVENTION (PCI-S);  Surgeon: Corky Crafts, MD;  Location: [facility] CATH LAB;  Service: Cardiovascular;  Laterality: N/A;   PERIPHERAL VASCULAR BALLOON ANGIOPLASTY Right 08/08/2020   Procedure: PERIPHERAL VASCULAR BALLOON ANGIOPLASTY;  Surgeon: Chuck Hint, MD;  Location: [facility] INVASIVE CV LAB;  Service: Cardiovascular;  Laterality: Right;  Posterior tibial    SHOULDER SURGERY Right    X 2   STUMP REVISION Right 01/09/2021   Procedure: REVISION RIGHT BELOW KNEE AMPUTATION;  Surgeon: Nadara Mustard, MD;  Location: [facility] OR;  Service: Orthopedics;  Laterality: Right;   TONSILLECTOMY  ~ 1948   TOTAL HIP REVISION Right 04/13/2019   Procedure: RIGHT TOTAL HIP REVISION-POSTERIOR  APPROACH LATERAL;  Surgeon: Eldred Manges, MD;  Location: [facility] OR;  Service: Orthopedics;  Laterality: Right;   TOTAL KNEE ARTHROPLASTY Left 07/23/2016   Procedure: LEFT TOTAL KNEE ARTHROPLASTY;  Surgeon: Eldred Manges, MD;  Location: [facility] OR;  Service: Orthopedics;  Laterality: Left;   Social History   Occupational History   Occupation: Retired  Tobacco Use   Smoking status: Former   Smokeless tobacco: Current    Types: Chew   Tobacco comments:    quit 60 years ago  Vaping Use   Vaping Use: Never used  Substance and Sexual Activity   Alcohol use: No   Drug use: No   Sexual activity: Not Currently

## 2023-02-21 NOTE — ED Provider Triage Note (Signed)
Emergency Medicine Provider Triage Evaluation Note  Keith Espinoza , a 82 y.o. male  was evaluated in triage.  Pt complains of Right hip pain.  Patient has a notable history ofRight below the knee amputation, Right hip replacement1 years ago with review in a few years ago.He presents after a fall that occurred yesterday withMild pain in the right hip. .  Review of Systems  Positive: Hip pain Negative: No pain anywhere else  Physical Exam  BP (!) 176/61   Pulse 73   Temp 97.8 F (36.6 C) (Oral)   Resp 16   SpO2 100%  Gen:   Awake, no distress speaking clearly Resp:  Normal effort no increased work of breathing MSK:   Moves extremities without difficulty right leg with prior amputation, but the patient flexes and extends the hip spontaneously Medical Decision Making  Medically screening exam initiated at 12:44 PM.  Appropriate orders placed.  MYLIN STOLAR was informed that the remainder of the evaluation will be completed by another provider, this initial triage assessment does not replace that evaluation, and the importance of remaining in the ED until their evaluation is complete.    Gerhard Munch, MD 02/21/23 1245

## 2023-02-21 NOTE — ED Triage Notes (Signed)
Pt to ED via EMS from home. Pt had fall yesterday while standing and making sandwich, pt tripped over roomba and fell against cabinet then onto floor on right hip. Pt has hx of RBK. Pt has hx of right hip replacement. No LOC. Pt denies hitting head. Pt is on plavix. Pt has swelling to right hip by previous surgery incision. No bruising. Pt c/o pain only when standing.    EMS Vitals: 68 HR 189/80 98% RA 222 CBG

## 2023-02-21 NOTE — ED Notes (Addendum)
Patient's daughter transporting patient to primary care provider's office. IV removed by this tech. LWBS

## 2023-02-22 ENCOUNTER — Ambulatory Visit
Admission: RE | Admit: 2023-02-22 | Discharge: 2023-02-22 | Disposition: A | Discharge status: Home / Self Care | Payer: Medicare Other | Source: Ambulatory Visit | Attending: Surgical | Admitting: Surgical

## 2023-02-22 ENCOUNTER — Other Ambulatory Visit: Payer: Self-pay | Admitting: Internal Medicine

## 2023-02-22 DIAGNOSIS — Z96641 Presence of right artificial hip joint: Secondary | ICD-10-CM | POA: Diagnosis not present

## 2023-02-22 DIAGNOSIS — M25551 Pain in right hip: Secondary | ICD-10-CM | POA: Diagnosis not present

## 2023-02-22 DIAGNOSIS — S7001XA Contusion of right hip, initial encounter: Secondary | ICD-10-CM

## 2023-02-22 NOTE — Progress Notes (Signed)
Keith Espinoza, I talked with Dr. Ophelia Charter about this patient.  I called Keith Espinoza earlier today and he said that he is feeling better but still has some soreness and some difficulty putting weight on the affected leg.  He will continue trying to tested out every morning but if he does not feel like he can weight-bear on it, he will not force the issue.  Dr. Ophelia Charter said that he will see him next week and consider putting an injection in his trochanteric region.  Can you arrange follow-up for him?  Thank you for your help

## 2023-03-08 ENCOUNTER — Ambulatory Visit (INDEPENDENT_AMBULATORY_CARE_PROVIDER_SITE_OTHER): Payer: Medicare Other | Admitting: Orthopaedic Surgery

## 2023-03-08 VITALS — BP 137/81

## 2023-03-08 DIAGNOSIS — S7001XS Contusion of right hip, sequela: Secondary | ICD-10-CM | POA: Diagnosis not present

## 2023-03-08 DIAGNOSIS — S7001XA Contusion of right hip, initial encounter: Secondary | ICD-10-CM | POA: Insufficient documentation

## 2023-03-08 NOTE — Progress Notes (Signed)
Office Visit Note   Patient: Keith Espinoza           Date of Birth: October 10, 1940           MRN: [id number] Visit Date: 03/08/2023              Requested by: Emilio Aspen, MD [address], [address] [place],  [place] [address] PCP: Emilio Aspen, MD   Assessment & Plan: Visit Diagnoses: No diagnosis found.  Plan: Patient with trochanteric hematoma after fall with acute trochanteric bursitis.  He had been on Plavix which likely contributed to some extra bleeding in the trochanteric bursa.  Gradually getting better he is ambulating with a cane can take some steps walk across the room without the cane but does better with a cane.  He can follow-up if he has ongoing symptoms.  Reviewed CT scan and plain radiographs.  At this point he does not need repeat aspiration.  Follow-Up Instructions: No follow-ups on file.   Orders:  No orders of the defined types were placed in this encounter.  No orders of the defined types were placed in this encounter.     Procedures: No procedures performed   Clinical Data: No additional findings.   Subjective: Chief Complaint  Patient presents with   Right Hip - Pain    HPI 82 year old male post right BKA with prosthesis had a fall on his right trochanter.  He was seen here in my absence and had 30 cc aspirated with trochanteric hematoma and plain radiographs and CT scan showed right total hip arthroplasty done by Dr. Criss Alvine without evidence of subsidence and no fracture.  Patient had 4 dislocations and I have done revision surgery with a constrained liner which is stayed in place.  Review of Systems all systems noncontributory.   Objective: Vital Signs: BP 137/81   Physical Exam Constitutional:      Appearance: He is well-developed.  HENT:     Head: Normocephalic and atraumatic.     Right Ear: External ear normal.     Left Ear: External ear normal.  Eyes:     Pupils: Pupils are equal, round, and reactive to  light.  Neck:     Thyroid: No thyromegaly.     Trachea: No tracheal deviation.  Cardiovascular:     Rate and Rhythm: Normal rate.  Pulmonary:     Effort: Pulmonary effort is normal.     Breath sounds: No wheezing.  Abdominal:     General: Bowel sounds are normal.     Palpations: Abdomen is soft.  Musculoskeletal:     Cervical back: Neck supple.  Skin:    General: Skin is warm and dry.     Capillary Refill: Capillary refill takes less than 2 seconds.  Neurological:     Mental Status: He is alert and oriented to person, place, and time.  Psychiatric:        Behavior: Behavior normal.        Thought Content: Thought content normal.        Judgment: Judgment normal.     Ortho Exam right BKA prosthesis.  Some tenderness over the trochanteric bursa minimal sciatic notch tenderness.  Specialty Comments:  No specialty comments available.  Imaging: No results found.   PMFS History: Patient Active Problem List   Diagnosis Date Noted   Type 2 diabetes mellitus with hyperglycemia, with long-term current use of insulin (HCC) 02/15/2022   Adenoma of left adrenal gland 02/15/2022  History of right below knee amputation (HCC) 10/06/2021   Exudative age-related macular degeneration of left eye with active choroidal neovascularization (HCC) 07/28/2021   Exudative age-related macular degeneration of right eye with active choroidal neovascularization (HCC) 06/01/2021   Intermediate stage nonexudative age-related macular degeneration of both eyes 06/01/2021   Type 2 diabetes mellitus with diabetic polyneuropathy, with long-term current use of insulin (HCC) 03/24/2021   Hypokalemia    Constipation 02/02/2021   Fecal impaction (HCC) 02/01/2021   Wound dehiscence 01/09/2021   Dehiscence of amputation stump (HCC)    Status post percutaneous transluminal coronary angioplasty 01/06/2021   Type II diabetes mellitus, uncontrolled 01/06/2021   Acute pancreatitis 01/06/2021   Diabetic  peripheral vascular disease (HCC) 01/06/2021   Encounter for screening for other disorder 01/06/2021   Enlarged prostate 01/06/2021   Foot ulcer, right (HCC) 01/06/2021   Gout 01/06/2021   Headache 01/06/2021   Neck pain 01/06/2021   Hypoglycemia 01/06/2021   Loss of appetite 01/06/2021   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   Peripheral neuropathy 01/06/2021   Sciatica 01/06/2021   Vitamin B12 deficiency 01/06/2021   Vitamin D deficiency 01/06/2021   Weakness 01/06/2021   Diabetes mellitus type 2 with neurological manifestations (HCC) 01/06/2021   Protein-calorie malnutrition, severe 12/26/2020   Acute blood loss anemia 12/26/2020   Prerenal azotemia 12/26/2020   Below-knee amputation of right lower extremity (HCC) 12/12/2020   Gangrene of right foot (HCC)    Diabetic neuropathy (HCC) 11/17/2020   Hyperglycemia due to type 2 diabetes mellitus (HCC) 11/17/2020   Long term (current) use of insulin (HCC) 11/17/2020   Obesity 11/17/2020   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/2019   Hip dislocation, right (HCC) 04/13/2019   Other intervertebral disc degeneration, lumbar region 03/30/2019   CAD (coronary artery disease) 01/30/2019   Tobacco abuse 01/30/2019   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   Burn, foot, second degree, left, initial encounter 06/08/2017   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 03/16/2017   S/P total knee arthroplasty, left 10/26/2016   Hyperlipidemia 09/04/2014   Thrombocytopenia (HCC)    Precordial chest pain 04/05/2014   Coronary atherosclerosis of native coronary artery 10/01/2013   Other and unspecified hyperlipidemia 10/01/2013   Primary hypertension 10/01/2013   Diabetes mellitus (HCC) 10/01/2013   Esophageal reflux 10/01/2013   Hypertrophy of prostate without urinary obstruction and other lower urinary tract symptoms (LUTS) 10/01/2013   Past Medical History:  Diagnosis Date   Allergic rhinitis    Allergic rhinitis     Arthritis    Basal cell carcinoma 11/01/2019    bcc left chest treatment TX cx3 5fu    Chronic leg pain    right   Chronic lower back pain    Coronary artery disease    a. Stenting to RCA 2004; staged DES to LAD and Cx 2004. DES to [facility] 2012. b. DES to mCx, PTCA to dCx 11/2011. c. Lateral wall MI 2013 s/p PTCA to distal Cx & DES to mid OM2 11/2011. d. Low risk nuc 04/2014, EF wnl.   COVID-19    Diabetes mellitus    Insulin dependent   Diabetic neuropathy (HCC)    MILD   Diverticulosis    Dysrhythmia    [name] syndrome    Gout    right wrist; right foot; right elbow; have had it since 1970's   H/O hiatal hernia    Heart murmur    History of echocardiogram    aortic sclerosis per echo 12/09 EF  65%, otherwise normal   History of hemorrhoids    BLEEDING   History of kidney stones    h/o   Hypertension    Diagnosed 1995    Myocardial infarction [facility])    Pancreatic pseudocyst    a. s/p remote drainage 2006.   Thrombocytopenia (HCC)    Seen on oldest labs in system from 2004   Vitamin B 12 deficiency    orally replaced    Family History  Problem Relation Age of Onset   Diabetes Mother    Hyperlipidemia Mother    Hypertension Mother    Cancer Father    Hypertension Father    Diabetes Sister    Hypertension Sister    Cancer Brother    Heart attack Neg Hx     Past Surgical History:  Procedure Laterality Date   ABDOMINAL AORTOGRAM W/LOWER EXTREMITY Bilateral 08/08/2020   Procedure: ABDOMINAL AORTOGRAM W/LOWER EXTREMITY;  Surgeon: Chuck Hint, MD;  Location: [facility] INVASIVE CV LAB;  Service: Cardiovascular;  Laterality: Bilateral;   AMPUTATION Right 12/12/2020   Procedure: RIGHT BELOW KNEE AMPUTATION;  Surgeon: Nadara Mustard, MD;  Location: [facility] OR;  Service: Orthopedics;  Laterality: Right;   BACK SURGERY     "total of 3 times" S/P fall    CARPAL TUNNEL RELEASE Bilateral    CHOLECYSTECTOMY  1990's   COLONOSCOPY     CORONARY ANGIOPLASTY  11/11/11   CORONARY ANGIOPLASTY  WITH STENT PLACEMENT  09/30/2011   "1 then; makes a total of 4"   CORONARY ANGIOPLASTY WITH STENT PLACEMENT  11/11/11   "1; makes a total of 5"   INGUINAL HERNIA REPAIR  2003   right   JOINT REPLACEMENT Right 04/03/2002   hip replacment   KNEE ARTHROSCOPY  1990's   left   LEFT HEART CATHETERIZATION WITH CORONARY ANGIOGRAM N/A 09/30/2011   Procedure: LEFT HEART CATHETERIZATION WITH CORONARY ANGIOGRAM;  Surgeon: Corky Crafts, MD;  Location: [facility] CATH LAB;  Service: Cardiovascular;  Laterality: N/A;  possible PCI   LEFT HEART CATHETERIZATION WITH CORONARY ANGIOGRAM N/A 11/15/2011   Procedure: LEFT HEART CATHETERIZATION WITH CORONARY ANGIOGRAM;  Surgeon: Corky Crafts, MD;  Location: [facility] CATH LAB;  Service: Cardiovascular;  Laterality: N/A;   PERCUTANEOUS CORONARY STENT INTERVENTION (PCI-S)  09/30/2011   Procedure: PERCUTANEOUS CORONARY STENT INTERVENTION (PCI-S);  Surgeon: Corky Crafts, MD;  Location: [facility] CATH LAB;  Service: Cardiovascular;;   PERCUTANEOUS CORONARY STENT INTERVENTION (PCI-S) N/A 11/11/2011   Procedure: PERCUTANEOUS CORONARY STENT INTERVENTION (PCI-S);  Surgeon: Corky Crafts, MD;  Location: [facility] CATH LAB;  Service: Cardiovascular;  Laterality: N/A;   PERIPHERAL VASCULAR BALLOON ANGIOPLASTY Right 08/08/2020   Procedure: PERIPHERAL VASCULAR BALLOON ANGIOPLASTY;  Surgeon: Chuck Hint, MD;  Location: [facility] INVASIVE CV LAB;  Service: Cardiovascular;  Laterality: Right;  Posterior tibial    SHOULDER SURGERY Right    X 2   STUMP REVISION Right 01/09/2021   Procedure: REVISION RIGHT BELOW KNEE AMPUTATION;  Surgeon: Nadara Mustard, MD;  Location[facility] OR;  Service: Orthopedics;  Laterality: Right;   TONSILLECTOMY  ~ 1948   TOTAL HIP REVISION Right 04/13/2019   Procedure: RIGHT TOTAL HIP REVISION-POSTERIOR  APPROACH LATERAL;  Surgeon: Eldred Manges, MD;  Location: [facility] OR;  Service: Orthopedics;  Laterality: Right;   TOTAL KNEE ARTHROPLASTY Left 07/23/2016   Procedure:  LEFT TOTAL KNEE ARTHROPLASTY;  Surgeon: Eldred Manges, MD;  Location: [facility] OR;  Service: Orthopedics;  Laterality: Left;   Social History  Occupational History   Occupation: Retired  Tobacco Use   Smoking status: Former   Smokeless tobacco: Current    Types: Chew   Tobacco comments:    quit 60 years ago  Vaping Use   Vaping Use: Never used  Substance and Sexual Activity   Alcohol use: No   Drug use: No   Sexual activity: Not Currently

## 2023-03-16 ENCOUNTER — Telehealth: Payer: Self-pay

## 2023-03-16 NOTE — Telephone Encounter (Signed)
Patient has been having lows in the 50-60 range for last 3 nights. Patient has not been taking the insulin at night time. Patient reduce the actos to 15 mg instead of 30mg ,  Patient has not been doing finger prick to check that the sugar is actually low. Patient does have snack before bedtime.  Patient has the reader so unable to download Dexcom

## 2023-03-17 NOTE — Telephone Encounter (Signed)
Patient daughter has been advised  

## 2023-03-17 NOTE — Telephone Encounter (Signed)
Please put the patient on the wait list for the next few weeks so I can see him

## 2023-04-01 ENCOUNTER — Ambulatory Visit (INDEPENDENT_AMBULATORY_CARE_PROVIDER_SITE_OTHER): Payer: Medicare Other | Admitting: Internal Medicine

## 2023-04-01 ENCOUNTER — Encounter: Payer: Self-pay | Admitting: Internal Medicine

## 2023-04-01 VITALS — BP 120/74 | HR 74 | Ht 70.0 in | Wt 161.0 lb

## 2023-04-01 DIAGNOSIS — D3502 Benign neoplasm of left adrenal gland: Secondary | ICD-10-CM | POA: Diagnosis not present

## 2023-04-01 DIAGNOSIS — E1165 Type 2 diabetes mellitus with hyperglycemia: Secondary | ICD-10-CM

## 2023-04-01 DIAGNOSIS — E1142 Type 2 diabetes mellitus with diabetic polyneuropathy: Secondary | ICD-10-CM | POA: Diagnosis not present

## 2023-04-01 DIAGNOSIS — E1159 Type 2 diabetes mellitus with other circulatory complications: Secondary | ICD-10-CM

## 2023-04-01 DIAGNOSIS — Z794 Long term (current) use of insulin: Secondary | ICD-10-CM | POA: Diagnosis not present

## 2023-04-01 LAB — POCT GLYCOSYLATED HEMOGLOBIN (HGB A1C): Hemoglobin A1C: 7.1 % — AB (ref 4.0–5.6)

## 2023-04-01 LAB — POCT GLUCOSE (DEVICE FOR HOME USE): POC Glucose: 230 mg/dl — AB (ref 70–99)

## 2023-04-01 NOTE — Patient Instructions (Addendum)
24-Hour Urine Collection  You will be collecting your urine for a 24-hour period of time. Your timer starts with your first urine of the morning (For example - If you first pee at 9AM, your timer will start at 9AM) Throw away your first urine of the morning Collect your urine every time you pee for the next 24 hours STOP your urine collection 24 hours after you started the collection (For example - You would stop at 9AM the day after you started)     - Take Tresiba 8 units ONCE daily  - Continue Metformin 1000 mg Twice a day  - Continue Jardiance 25 mg daily - Continue  Actos 30 mg , 1 tablet daily   - Novolog correctional insulin:  Use the scale below to help guide you before meals   Blood sugar before meal Number of units to inject  Less than 175 0 unit  176 - 215 1 units  216 - 255 2 units  256 - 295 3 units  296- 335 4 units  336 - 375 5 units  376 - 410 6 units        HOW TO TREAT LOW BLOOD SUGARS (Blood sugar LESS THAN 70 MG/DL) Please follow the RULE OF 15 for the treatment of hypoglycemia treatment (when your (blood sugars are less than 70 mg/dL)   STEP 1: Take 15 grams of carbohydrates when your blood sugar is low, which includes:  3-4 GLUCOSE TABS  OR 3-4 OZ OF JUICE OR REGULAR SODA OR ONE TUBE OF GLUCOSE GEL    STEP 2: RECHECK blood sugar in 15 MINUTES STEP 3: If your blood sugar is still low at the 15 minute recheck --> then, go back to STEP 1 and treat AGAIN with another 15 grams of carbohydrates.

## 2023-04-01 NOTE — Progress Notes (Signed)
Name: Keith Espinoza  Age/ Sex: 82 y.o., male   MRN/ DOB: [id number], 30-Nov-1940     PCP: Emilio Aspen, MD   Reason for Endocrinology Evaluation: Type 2 Diabetes Mellitus  Initial Endocrine Consultative Visit: 03/24/2021    PATIENT IDENTIFIER: [name] is a 82 y.o. male with a past medical history of Right BK amputations due  PVD, T2DM and Pancreatitis .Marland Kitchen The patient has followed with Endocrinology clinic since 03/24/2021 for consultative assistance with management of his diabetes.  DIABETIC HISTORY:  Keith Espinoza was diagnosed with DM 2015, Trulicity caused weight loss, Has been on Glimepiride in the past. His hemoglobin A1c has ranged from 6.6% in 2020, peaking at 7.7% in 2022.     Transitioned care from Dr. Ronnette Hila in 2022    On his initial visit to our clinic he had an A1c 7.5 % He was on jardiance, Metformin and Basal insulin, which I have reduced basal insulin   Started pioglitazoe 10/2021  Patient self discontinued basal insulin due to hypoglycemia 03/2023  ADRENAL HISTORY : Pt found to have a 1 cm left adrenal nodule on CT imaging of abdomen on 01/31/2021. Of note, the pt was noted to have hypokalemia , this is chronic and has been on KCL for ~ 5 yrs      Aldo, renin, 24-hr urinary cortisol and catecholamine have all come back normal 03/2021   SUBJECTIVE:   During the last visit (11/11/2022): A1c 7.2%     Today (04/01/2023): Keith Espinoza is here for a follow up on diabetes management.Marland Kitchen  He checks his blood sugars multiple times daily through CGM. Marland Kitchen The patient has had hypoglycemic episodes since the last clinic visit.  He continues to follow-up with Dr. Lajoyce Corners for right below-knee amputation He continues to follow-up with ophthalmology for macular degeneration Presented to the ED in May for constipation Patient continues to follow-up with cardiology for CAD    Denies nausea, vomiting Denies constipation or diarrhea    HOME DIABETES REGIMEN:   Evaristo Bury 12 units daily - he takes 6-12 units Metformin 1000 mg Twice a day  Jardiance 25 mg daily Pioglitazone 30 mg daily  CF: Novolog (BG-130/40) TIDQAC     Statin: Intolerant to Simvastatin due to severe myalgia  ACE-I/ARB: yes      CONTINUOUS GLUCOSE MONITORING RECORD INTERPRETATION   Did not bring the receiver     DIABETIC COMPLICATIONS: Microvascular complications:  Neuropathy, macular degeneration Denies: CKD Last Eye Exam: Completed 04/15/2022  Macrovascular complications:  CAD, PVD Denies: CVA   HISTORY:  Past Medical History:  Past Medical History:  Diagnosis Date   Allergic rhinitis    Allergic rhinitis    Arthritis    Basal cell carcinoma 11/01/2019    bcc left chest treatment [place] cx3 20fu    Chronic leg pain    right   Chronic lower back pain    Coronary artery disease    a. Stenting to RCA 2004; staged DES to LAD and Cx 2004. DES to [facility] 2012. b. DES to mCx, PTCA to dCx 11/2011. c. Lateral wall MI 2013 s/p PTCA to distal Cx & DES to mid OM2 11/2011. d. Low risk nuc 04/2014, EF wnl.   COVID-19    Diabetes mellitus    Insulin dependent   Diabetic neuropathy (HCC)    MILD   Diverticulosis    Dysrhythmia    [name] syndrome    Gout    right wrist; right foot; right elbow; have  had it since 1970's   H/O hiatal hernia    Heart murmur    History of echocardiogram    aortic sclerosis per echo 12/09 EF 65%, otherwise normal   History of hemorrhoids    BLEEDING   History of kidney stones    h/o   Hypertension    Diagnosed 1995    Myocardial infarction [facility])    Pancreatic pseudocyst    a. s/p remote drainage 2006.   Thrombocytopenia (HCC)    Seen on oldest labs in system from 2004   Vitamin B 12 deficiency    orally replaced   Past Surgical History:  Past Surgical History:  Procedure Laterality Date   ABDOMINAL AORTOGRAM W/LOWER EXTREMITY Bilateral 08/08/2020   Procedure: ABDOMINAL AORTOGRAM W/LOWER EXTREMITY;  Surgeon: Chuck Hint, MD;  Location: [facility] INVASIVE CV LAB;  Service: Cardiovascular;  Laterality: Bilateral;   AMPUTATION Right 12/12/2020   Procedure: RIGHT BELOW KNEE AMPUTATION;  Surgeon: Nadara Mustard, MD;  Location: [facility] OR;  Service: Orthopedics;  Laterality: Right;   BACK SURGERY     "total of 3 times" S/P fall    CARPAL TUNNEL RELEASE Bilateral    CHOLECYSTECTOMY  1990's   COLONOSCOPY     CORONARY ANGIOPLASTY  11/11/11   CORONARY ANGIOPLASTY WITH STENT PLACEMENT  09/30/2011   "1 then; makes a total of 4"   CORONARY ANGIOPLASTY WITH STENT PLACEMENT  11/11/11   "1; makes a total of 5"   INGUINAL HERNIA REPAIR  2003   right   JOINT REPLACEMENT Right 04/03/2002   hip replacment   KNEE ARTHROSCOPY  1990's   left   LEFT HEART CATHETERIZATION WITH CORONARY ANGIOGRAM N/A 09/30/2011   Procedure: LEFT HEART CATHETERIZATION WITH CORONARY ANGIOGRAM;  Surgeon: Corky Crafts, MD;  Location: [facility] CATH LAB;  Service: Cardiovascular;  Laterality: N/A;  possible PCI   LEFT HEART CATHETERIZATION WITH CORONARY ANGIOGRAM N/A 11/15/2011   Procedure: LEFT HEART CATHETERIZATION WITH CORONARY ANGIOGRAM;  Surgeon: Corky Crafts, MD;  Location: [facility] CATH LAB;  Service: Cardiovascular;  Laterality: N/A;   PERCUTANEOUS CORONARY STENT INTERVENTION (PCI-S)  09/30/2011   Procedure: PERCUTANEOUS CORONARY STENT INTERVENTION (PCI-S);  Surgeon: Corky Crafts, MD;  Location: [facility] CATH LAB;  Service: Cardiovascular;;   PERCUTANEOUS CORONARY STENT INTERVENTION (PCI-S) N/A 11/11/2011   Procedure: PERCUTANEOUS CORONARY STENT INTERVENTION (PCI-S);  Surgeon: Corky Crafts, MD;  Location: [facility] CATH LAB;  Service: Cardiovascular;  Laterality: N/A;   PERIPHERAL VASCULAR BALLOON ANGIOPLASTY Right 08/08/2020   Procedure: PERIPHERAL VASCULAR BALLOON ANGIOPLASTY;  Surgeon: Chuck Hint, MD;  Location: [facility] INVASIVE CV LAB;  Service: Cardiovascular;  Laterality: Right;  Posterior tibial    SHOULDER SURGERY Right    X 2   STUMP REVISION  Right 01/09/2021   Procedure: REVISION RIGHT BELOW KNEE AMPUTATION;  Surgeon: Nadara Mustard, MD;  Location: [facility] OR;  Service: Orthopedics;  Laterality: Right;   TONSILLECTOMY  ~ 1948   TOTAL HIP REVISION Right 04/13/2019   Procedure: RIGHT TOTAL HIP REVISION-POSTERIOR  APPROACH LATERAL;  Surgeon: Eldred Manges, MD;  Location: [facility] OR;  Service: Orthopedics;  Laterality: Right;   TOTAL KNEE ARTHROPLASTY Left 07/23/2016   Procedure: LEFT TOTAL KNEE ARTHROPLASTY;  Surgeon: Eldred Manges, MD;  Location: [facility] OR;  Service: Orthopedics;  Laterality: Left;   Social History:  reports that he has quit smoking. His smokeless tobacco use includes chew. He reports that he does not drink alcohol and does not use drugs. Family History:  Family History  Problem Relation Age of Onset   Diabetes Mother    Hyperlipidemia Mother    Hypertension Mother    Cancer Father    Hypertension Father    Diabetes Sister    Hypertension Sister    Cancer Brother    Heart attack Neg Hx      HOME MEDICATIONS: Allergies as of 04/01/2023       Reactions   Simvastatin Other (See Comments)   SEVERE MYALGIAS   Zetia [ezetimibe] Other (See Comments)   MYALGIAS   Dilaudid [hydromorphone Hcl] Other (See Comments)   hallucination        Medication List        Accurate as of April 01, 2023  7:07 AM. If you have any questions, ask your nurse or doctor.          acetaminophen 500 MG tablet Commonly known as: TYLENOL Take 1,000 mg by mouth every 6 (six) hours as needed (pain).   allopurinol 300 MG tablet Commonly known as: ZYLOPRIM Take 300 mg by mouth daily.   amLODipine 2.5 MG tablet Commonly known as: NORVASC Take 1 tablet (2.5 mg total) by mouth daily.   aspirin EC 81 MG tablet Take 81 mg by mouth daily.   B-12 2500 MCG Tabs Take 2,500 mcg by mouth daily.   clopidogrel 75 MG tablet Commonly known as: PLAVIX Take 1 tablet (75 mg total) by mouth daily.   Dexcom G7 Sensor Misc 1 Device by Does not  apply route as directed.   gabapentin 400 MG capsule Commonly known as: NEURONTIN Take 1 capsule (400 mg total) by mouth 3 (three) times daily. What changed: when to take this   gabapentin 100 MG capsule Commonly known as: NEURONTIN Take 2 capsules (200 mg total) by mouth at bedtime. What changed: how much to take   hydrochlorothiazide 12.5 MG tablet Commonly known as: HYDRODIURIL Take 1 tablet (12.5 mg total) by mouth daily.   Insulin Pen Needle 32G X 4 MM Misc 1 Device by Does not apply route in the morning, at noon, in the evening, and at bedtime.   isosorbide mononitrate 30 MG 24 hr tablet Commonly known as: IMDUR Take 1 tablet (30 mg total) by mouth daily. Please schedule yearly appointment for future refills. Thank you   Jardiance 25 MG Tabs tablet Generic drug: empagliflozin TAKE 1 TABLET(25 MG) BY MOUTH DAILY   losartan 50 MG tablet Commonly known as: COZAAR Take 1 tablet (50 mg total) by mouth daily. What changed: how much to take   metFORMIN 1000 MG tablet Commonly known as: GLUCOPHAGE Take 1 tablet (1,000 mg total) by mouth 2 (two) times daily.   nitroGLYCERIN 0.4 MG SL tablet Commonly known as: NITROSTAT Place 0.4 mg under the tongue every 5 (five) minutes x 3 doses as needed for chest pain.   NovoLOG FlexPen 100 UNIT/ML FlexPen Generic drug: insulin aspart Max daily 30 units   ONE TOUCH ULTRA TEST test strip Generic drug: glucose blood CHECK BLOOD SUGAR ONCE DAILY AS DIRECTED   pioglitazone 30 MG tablet Commonly known as: Actos Take 1 tablet (30 mg total) by mouth daily.   polyethylene glycol 17 g packet Commonly known as: MiraLax Take 17 g by mouth 2 (two) times daily.   potassium chloride SA 20 MEQ tablet Commonly known as: KLOR-CON M Take 1 tablet (20 mEq total) by mouth daily.   senna-docusate 8.6-50 MG tablet Commonly known as: Senokot-S Take 1 tablet by mouth at bedtime.  terazosin 5 MG capsule Commonly known as: HYTRIN Take 1  capsule (5 mg total) by mouth at bedtime. What changed: how much to take   Tresiba FlexTouch 100 UNIT/ML FlexTouch Pen Generic drug: insulin degludec Inject 12 Units into the skin daily.   Vitamin D3 50 MCG (2000 UT) Tabs Take 2,000 Units by mouth daily.         OBJECTIVE:   Vital Signs:BP 120/74 (BP Location: Left Arm, Patient Position: Sitting, Cuff Size: Small)   Pulse 74   Ht 5\' 10"  (1.778 m)   Wt 161 lb (73 kg)   SpO2 96%   BMI 23.10 kg/m    Wt Readings from Last 3 Encounters:  02/16/23 165 lb (74.8 kg)  11/11/22 163 lb (73.9 kg)  09/16/22 159 lb (72.1 kg)     Exam: General: Pt appears well and is in NAD  Neck: General: Supple without adenopathy. Thyroid: Thyroid size normal.  No goiter or nodules appreciated.   Lungs: Clear with good BS bilat with no rales, rhonchi, or wheezes  Heart: RRR  Neuro: MS is good with appropriate affect, pt is alert and Ox3     DATA REVIEWED:  Lab Results  Component Value Date   HGBA1C 7.2 (A) 11/11/2022   HGBA1C 9.0 (A) 06/23/2022   HGBA1C 8.3 (A) 10/06/2021    Latest Reference Range & Units 11/11/22 10:56  Sodium 135 - 145 mEq/L 145  Potassium 3.5 - 5.1 mEq/L 4.6  Chloride 96 - 112 mEq/L 104  CO2 19 - 32 mEq/L 28  Glucose 70 - 99 mg/dL 161 (H)  BUN 6 - 23 mg/dL 21  Creatinine 0.96 - 0.45 mg/dL 4.09  Calcium 8.4 - 81.1 mg/dL 9.9  GFR >91.47 mL/min 51.69 (L)    CT abdomen 01/31/2021   Indeterminate 10 mm left adrenal nodule, likely adenoma.     MRI abdomen 12/2022 Adrenals/Urinary tract: 1.9 x 1.3 cm left adrenal mass is seen which shows signal dropout on chemical shift imaging, consistent with a benign adrenal adenoma. This is also stable compared to previous CT. No suspicious renal masses identified. No evidence of hydronephrosis.    ASSESSMENT / PLAN / RECOMMENDATIONS:   1) Type 2 Diabetes Mellitus, Optimally controlled, With neuropathic and macrovascular  complications - Most recent A1c of 7.2 %. Goal  A1c < 7.5 %.    - A1c continues to trend down  - NO CGM data today as he forgot her receiver  - He continues to self adjust basal insulin, as he has been noted with hypoglycemia, I have recommended Tresiba 8 units as below  - Pt with hx of pancreatitis , hence DPP-4 inhibitors and GLP-1 agonists are contra-indicated.  - I will also  adjust his sensitivity factor from 40 to 45 as the pt endorses a quick decrease  in his BG     MEDICATIONS: Decrease Tresiba  8  units daily  Continue Metformin 1000 mg Twice a day  Continue Jardiance 25 mg daily Continue pioglitazone 30 mg daily  Change correction factor: NovoLog (BG -130/45) with each meal    EDUCATION / INSTRUCTIONS: BG monitoring instructions: Patient is instructed to check his blood sugars 1 times a day, fasting . Call [facility] if: BG persistently < 70  I reviewed the Rule of 15 for the treatment of hypoglycemia in detail with the patient. Literature supplied.    2) Diabetic complications:  Eye:  Unknown to have diabetic retinopathy.  Neuro/ Feet: Does  have known diabetic  peripheral neuropathy .  Renal: Patient does not have known baseline CKD. He   is  on an ACEI/ARB at present.    3) Left Adrenal Adenoma:   - Hormonal workup has ruled out cushing syndrome (2022), Pheo and hyperaldo 06/2022 , patient has been noted with slight elevation of plasma normetanephrine, will proceed with 24 hr urinary cortisol/metanephrines  - Repeat adrenal imaging showed stability 12/2022 - no further imaging recommended     F/U in 4 months     Signed electronically by: Lyndle Herrlich, MD  [facility] End[facility]ology  [facility] [address] [place], [place] [address] Phone: [phone] FAX: [phone]   CC: Emilio Aspen, MD [address], [address] [place] [address] Phone: [phone]  Fax: [phone]  Return to Endocrinology clinic as below: Future  Appointments  Date Time Provider Department Center  04/01/2023 10:30 AM Angelly Spearing, Konrad Dolores, MD [facility] None  06/23/2023  9:30 AM [facility] - [facility]  06/23/2023 10:20 AM Chuck Hint, MD [facility]

## 2023-04-04 ENCOUNTER — Other Ambulatory Visit: Payer: Medicare Other

## 2023-04-04 DIAGNOSIS — D3502 Benign neoplasm of left adrenal gland: Secondary | ICD-10-CM

## 2023-04-04 DIAGNOSIS — H353211 Exudative age-related macular degeneration, right eye, with active choroidal neovascularization: Secondary | ICD-10-CM | POA: Diagnosis not present

## 2023-04-04 DIAGNOSIS — H353133 Nonexudative age-related macular degeneration, bilateral, advanced atrophic without subfoveal involvement: Secondary | ICD-10-CM | POA: Diagnosis not present

## 2023-04-04 DIAGNOSIS — E113393 Type 2 diabetes mellitus with moderate nonproliferative diabetic retinopathy without macular edema, bilateral: Secondary | ICD-10-CM | POA: Diagnosis not present

## 2023-04-04 DIAGNOSIS — H353221 Exudative age-related macular degeneration, left eye, with active choroidal neovascularization: Secondary | ICD-10-CM | POA: Diagnosis not present

## 2023-04-06 ENCOUNTER — Other Ambulatory Visit: Payer: Self-pay | Admitting: Internal Medicine

## 2023-04-08 LAB — CORTISOL, URINE, 24 HOUR

## 2023-04-08 LAB — METANEPHRINES, URINE, 24 HOUR

## 2023-04-08 LAB — EXTRA URINE SPECIMEN

## 2023-04-11 DIAGNOSIS — R42 Dizziness and giddiness: Secondary | ICD-10-CM | POA: Diagnosis not present

## 2023-04-11 DIAGNOSIS — Z1379 Encounter for other screening for genetic and chromosomal anomalies: Secondary | ICD-10-CM | POA: Diagnosis not present

## 2023-04-11 DIAGNOSIS — E088 Diabetes mellitus due to underlying condition with unspecified complications: Secondary | ICD-10-CM | POA: Diagnosis not present

## 2023-04-11 DIAGNOSIS — Z818 Family history of other mental and behavioral disorders: Secondary | ICD-10-CM | POA: Diagnosis not present

## 2023-04-11 DIAGNOSIS — D3502 Benign neoplasm of left adrenal gland: Secondary | ICD-10-CM | POA: Diagnosis not present

## 2023-04-11 DIAGNOSIS — R2681 Unsteadiness on feet: Secondary | ICD-10-CM | POA: Diagnosis not present

## 2023-04-11 DIAGNOSIS — Z820 Family history of epilepsy and other diseases of the nervous system: Secondary | ICD-10-CM | POA: Diagnosis not present

## 2023-04-11 DIAGNOSIS — R296 Repeated falls: Secondary | ICD-10-CM | POA: Diagnosis not present

## 2023-04-11 DIAGNOSIS — G4700 Insomnia, unspecified: Secondary | ICD-10-CM | POA: Diagnosis not present

## 2023-04-11 NOTE — Addendum Note (Signed)
Addended by: Karen Kays C on: 04/11/2023 10:45 AM   Modules accepted: Orders

## 2023-04-14 DIAGNOSIS — E113393 Type 2 diabetes mellitus with moderate nonproliferative diabetic retinopathy without macular edema, bilateral: Secondary | ICD-10-CM | POA: Diagnosis not present

## 2023-04-14 DIAGNOSIS — H353133 Nonexudative age-related macular degeneration, bilateral, advanced atrophic without subfoveal involvement: Secondary | ICD-10-CM | POA: Diagnosis not present

## 2023-04-14 DIAGNOSIS — H353211 Exudative age-related macular degeneration, right eye, with active choroidal neovascularization: Secondary | ICD-10-CM | POA: Diagnosis not present

## 2023-04-14 DIAGNOSIS — H353221 Exudative age-related macular degeneration, left eye, with active choroidal neovascularization: Secondary | ICD-10-CM | POA: Diagnosis not present

## 2023-04-16 LAB — CORTISOL, FREE AND CORTISONE, 24 HOUR URINE W/CREATININE
24 Hour urine volume (VMAHVA): 2100 mL
CREATININE, URINE: 0.83 g/(24.h) (ref 0.50–2.15)
Cortisol (Ur), Free: 24.6 mcg/24 h (ref 4.0–50.0)
Cortisone, 24H Ur: 79.6 mcg/24 h (ref 23–195)

## 2023-04-22 ENCOUNTER — Emergency Department (HOSPITAL_BASED_OUTPATIENT_CLINIC_OR_DEPARTMENT_OTHER): Payer: Medicare Other

## 2023-04-22 ENCOUNTER — Encounter (HOSPITAL_BASED_OUTPATIENT_CLINIC_OR_DEPARTMENT_OTHER): Payer: Self-pay | Admitting: Emergency Medicine

## 2023-04-22 ENCOUNTER — Inpatient Hospital Stay (HOSPITAL_BASED_OUTPATIENT_CLINIC_OR_DEPARTMENT_OTHER)
Admission: EM | Admit: 2023-04-22 | Discharge: 2023-04-27 | DRG: 287 | Disposition: A | Discharge status: Home / Self Care | Payer: Medicare Other | Attending: Internal Medicine | Admitting: Internal Medicine

## 2023-04-22 ENCOUNTER — Other Ambulatory Visit: Payer: Self-pay

## 2023-04-22 DIAGNOSIS — Z8616 Personal history of COVID-19: Secondary | ICD-10-CM | POA: Diagnosis not present

## 2023-04-22 DIAGNOSIS — I739 Peripheral vascular disease, unspecified: Secondary | ICD-10-CM | POA: Diagnosis not present

## 2023-04-22 DIAGNOSIS — Z885 Allergy status to narcotic agent status: Secondary | ICD-10-CM

## 2023-04-22 DIAGNOSIS — I129 Hypertensive chronic kidney disease with stage 1 through stage 4 chronic kidney disease, or unspecified chronic kidney disease: Secondary | ICD-10-CM | POA: Diagnosis not present

## 2023-04-22 DIAGNOSIS — E785 Hyperlipidemia, unspecified: Secondary | ICD-10-CM | POA: Diagnosis present

## 2023-04-22 DIAGNOSIS — J309 Allergic rhinitis, unspecified: Secondary | ICD-10-CM | POA: Diagnosis present

## 2023-04-22 DIAGNOSIS — I252 Old myocardial infarction: Secondary | ICD-10-CM

## 2023-04-22 DIAGNOSIS — K861 Other chronic pancreatitis: Secondary | ICD-10-CM | POA: Diagnosis not present

## 2023-04-22 DIAGNOSIS — I350 Nonrheumatic aortic (valve) stenosis: Secondary | ICD-10-CM | POA: Diagnosis not present

## 2023-04-22 DIAGNOSIS — E118 Type 2 diabetes mellitus with unspecified complications: Secondary | ICD-10-CM | POA: Diagnosis present

## 2023-04-22 DIAGNOSIS — Z79899 Other long term (current) drug therapy: Secondary | ICD-10-CM | POA: Diagnosis not present

## 2023-04-22 DIAGNOSIS — Z8679 Personal history of other diseases of the circulatory system: Secondary | ICD-10-CM | POA: Diagnosis not present

## 2023-04-22 DIAGNOSIS — H353231 Exudative age-related macular degeneration, bilateral, with active choroidal neovascularization: Secondary | ICD-10-CM | POA: Diagnosis not present

## 2023-04-22 DIAGNOSIS — Z96643 Presence of artificial hip joint, bilateral: Secondary | ICD-10-CM | POA: Diagnosis present

## 2023-04-22 DIAGNOSIS — M109 Gout, unspecified: Secondary | ICD-10-CM | POA: Diagnosis present

## 2023-04-22 DIAGNOSIS — G8929 Other chronic pain: Secondary | ICD-10-CM | POA: Diagnosis present

## 2023-04-22 DIAGNOSIS — R079 Chest pain, unspecified: Secondary | ICD-10-CM | POA: Diagnosis not present

## 2023-04-22 DIAGNOSIS — I10 Essential (primary) hypertension: Secondary | ICD-10-CM | POA: Diagnosis not present

## 2023-04-22 DIAGNOSIS — Z85828 Personal history of other malignant neoplasm of skin: Secondary | ICD-10-CM

## 2023-04-22 DIAGNOSIS — I452 Bifascicular block: Secondary | ICD-10-CM | POA: Diagnosis present

## 2023-04-22 DIAGNOSIS — E1151 Type 2 diabetes mellitus with diabetic peripheral angiopathy without gangrene: Secondary | ICD-10-CM | POA: Diagnosis present

## 2023-04-22 DIAGNOSIS — N179 Acute kidney failure, unspecified: Secondary | ICD-10-CM | POA: Diagnosis not present

## 2023-04-22 DIAGNOSIS — Z87891 Personal history of nicotine dependence: Secondary | ICD-10-CM

## 2023-04-22 DIAGNOSIS — E876 Hypokalemia: Secondary | ICD-10-CM | POA: Diagnosis present

## 2023-04-22 DIAGNOSIS — E1165 Type 2 diabetes mellitus with hyperglycemia: Secondary | ICD-10-CM | POA: Diagnosis present

## 2023-04-22 DIAGNOSIS — I2510 Atherosclerotic heart disease of native coronary artery without angina pectoris: Secondary | ICD-10-CM | POA: Diagnosis not present

## 2023-04-22 DIAGNOSIS — Z888 Allergy status to other drugs, medicaments and biological substances status: Secondary | ICD-10-CM

## 2023-04-22 DIAGNOSIS — Z7982 Long term (current) use of aspirin: Secondary | ICD-10-CM

## 2023-04-22 DIAGNOSIS — I700 Atherosclerosis of aorta: Secondary | ICD-10-CM | POA: Diagnosis not present

## 2023-04-22 DIAGNOSIS — Z833 Family history of diabetes mellitus: Secondary | ICD-10-CM

## 2023-04-22 DIAGNOSIS — I2583 Coronary atherosclerosis due to lipid rich plaque: Secondary | ICD-10-CM | POA: Diagnosis not present

## 2023-04-22 DIAGNOSIS — Z89511 Acquired absence of right leg below knee: Secondary | ICD-10-CM

## 2023-04-22 DIAGNOSIS — I200 Unstable angina: Secondary | ICD-10-CM | POA: Diagnosis not present

## 2023-04-22 DIAGNOSIS — Z87442 Personal history of urinary calculi: Secondary | ICD-10-CM

## 2023-04-22 DIAGNOSIS — Z7984 Long term (current) use of oral hypoglycemic drugs: Secondary | ICD-10-CM

## 2023-04-22 DIAGNOSIS — Z951 Presence of aortocoronary bypass graft: Secondary | ICD-10-CM | POA: Diagnosis not present

## 2023-04-22 DIAGNOSIS — Z83438 Family history of other disorder of lipoprotein metabolism and other lipidemia: Secondary | ICD-10-CM

## 2023-04-22 DIAGNOSIS — E7800 Pure hypercholesterolemia, unspecified: Secondary | ICD-10-CM | POA: Diagnosis not present

## 2023-04-22 DIAGNOSIS — Z7902 Long term (current) use of antithrombotics/antiplatelets: Secondary | ICD-10-CM

## 2023-04-22 DIAGNOSIS — N1831 Chronic kidney disease, stage 3a: Secondary | ICD-10-CM | POA: Diagnosis not present

## 2023-04-22 DIAGNOSIS — E1122 Type 2 diabetes mellitus with diabetic chronic kidney disease: Secondary | ICD-10-CM | POA: Diagnosis present

## 2023-04-22 DIAGNOSIS — Z794 Long term (current) use of insulin: Secondary | ICD-10-CM | POA: Diagnosis not present

## 2023-04-22 DIAGNOSIS — E1140 Type 2 diabetes mellitus with diabetic neuropathy, unspecified: Secondary | ICD-10-CM | POA: Diagnosis not present

## 2023-04-22 DIAGNOSIS — R7989 Other specified abnormal findings of blood chemistry: Secondary | ICD-10-CM

## 2023-04-22 DIAGNOSIS — Z9049 Acquired absence of other specified parts of digestive tract: Secondary | ICD-10-CM

## 2023-04-22 DIAGNOSIS — R001 Bradycardia, unspecified: Secondary | ICD-10-CM | POA: Diagnosis not present

## 2023-04-22 DIAGNOSIS — Z8249 Family history of ischemic heart disease and other diseases of the circulatory system: Secondary | ICD-10-CM

## 2023-04-22 DIAGNOSIS — I25110 Atherosclerotic heart disease of native coronary artery with unstable angina pectoris: Principal | ICD-10-CM | POA: Diagnosis present

## 2023-04-22 DIAGNOSIS — M1A9XX0 Chronic gout, unspecified, without tophus (tophi): Secondary | ICD-10-CM | POA: Diagnosis not present

## 2023-04-22 DIAGNOSIS — Z95818 Presence of other cardiac implants and grafts: Secondary | ICD-10-CM | POA: Diagnosis not present

## 2023-04-22 LAB — CBC
HCT: 32.2 % — ABNORMAL LOW (ref 39.0–52.0)
Hemoglobin: 10.7 g/dL — ABNORMAL LOW (ref 13.0–17.0)
MCH: 30.8 pg (ref 26.0–34.0)
MCHC: 33.2 g/dL (ref 30.0–36.0)
MCV: 92.8 fL (ref 80.0–100.0)
Platelets: 114 10*3/uL — ABNORMAL LOW (ref 150–400)
RBC: 3.47 MIL/uL — ABNORMAL LOW (ref 4.22–5.81)
RDW: 16.4 % — ABNORMAL HIGH (ref 11.5–15.5)
WBC: 4.1 10*3/uL (ref 4.0–10.5)
nRBC: 0 % (ref 0.0–0.2)

## 2023-04-22 LAB — BASIC METABOLIC PANEL
Anion gap: 11 (ref 5–15)
BUN: 28 mg/dL — ABNORMAL HIGH (ref 8–23)
CO2: 28 mmol/L (ref 22–32)
Calcium: 10 mg/dL (ref 8.9–10.3)
Chloride: 102 mmol/L (ref 98–111)
Creatinine, Ser: 1.35 mg/dL — ABNORMAL HIGH (ref 0.61–1.24)
GFR, Estimated: 52 mL/min — ABNORMAL LOW (ref >60)
Glucose, Bld: 304 mg/dL — ABNORMAL HIGH (ref 70–99)
Potassium: 3.7 mmol/L (ref 3.5–5.1)
Sodium: 141 mmol/L (ref 135–145)

## 2023-04-22 LAB — TROPONIN I (HIGH SENSITIVITY): Troponin I (High Sensitivity): 10 ng/L (ref <18)

## 2023-04-22 MED ORDER — ASPIRIN 81 MG PO CHEW
CHEWABLE_TABLET | ORAL | Status: AC
  Filled 2023-04-22: qty 4

## 2023-04-22 MED ORDER — ASPIRIN 81 MG PO CHEW
324.0000 mg | CHEWABLE_TABLET | Freq: Once | ORAL | Status: AC
  Administered 2023-04-22: 324 mg via ORAL

## 2023-04-22 NOTE — ED Notes (Signed)
Pt denies CP at this time 

## 2023-04-22 NOTE — ED Provider Notes (Signed)
[place] EMERGENCY DEPARTMENT AT [facility] Provider Note  CSN: [id number] Arrival date & time: 04/22/23 2247  Chief Complaint(s) Chest Pain  HPI Keith Espinoza is a 82 y.o. male {Add pertinent medical, surgical, social history, OB history to HPI:1}    Chest Pain Pain location:  Substernal area Pain quality: pressure   Pain radiates to:  Does not radiate Onset quality:  Sudden Duration:  5 hours Timing:  Intermittent Progression:  Resolved Chronicity:  Recurrent Relieved by:  Nitroglycerin Worsened by:  Exertion Associated symptoms: no cough, no fever, no heartburn, no nausea, no shortness of breath and no vomiting   Risk factors: coronary artery disease, diabetes mellitus, high cholesterol, hypertension and male sex   Risk factors: no smoking     Past Medical History Past Medical History:  Diagnosis Date   Allergic rhinitis    Allergic rhinitis    Arthritis    Basal cell carcinoma 11/01/2019    bcc left chest treatment TX [id number] [id number]    Chronic leg pain    right   Chronic lower back pain    Coronary artery disease    a. Stenting to RCA 2004; staged DES to LAD and Cx 2004. DES to [facility] 2012. b. DES to mCx, PTCA to dCx 11/2011. c. Lateral wall MI 2013 s/p PTCA to distal Cx & DES to mid OM2 11/2011. d. Low risk nuc 04/2014, EF wnl.   COVID-19    Diabetes mellitus    Insulin dependent   Diabetic neuropathy (HCC)    MILD   Diverticulosis    Dysrhythmia    [name] syndrome    Gout    right wrist; right foot; right elbow; have had it since 1970's   H/O hiatal hernia    Heart murmur    History of echocardiogram    aortic sclerosis per echo 12/09 EF 65%, otherwise normal   History of hemorrhoids    BLEEDING   History of kidney stones    h/o   Hypertension    Diagnosed 1995    Myocardial infarction [facility])    Pancreatic pseudocyst    a. s/p remote drainage 2006.   Thrombocytopenia (HCC)    Seen on oldest labs in system from 2004   Vitamin B 12 deficiency     orally replaced   Patient Active Problem List   Diagnosis Date Noted   Contusion of right hip 03/08/2023   Type 2 diabetes mellitus with hyperglycemia, with long-term current use of insulin (HCC) 02/15/2022   Adenoma of left adrenal gland 02/15/2022   History of right below knee amputation (HCC) 10/06/2021   Exudative age-related macular degeneration of left eye with active choroidal neovascularization (HCC) 07/28/2021   Exudative age-related macular degeneration of right eye with active choroidal neovascularization (HCC) 06/01/2021   Intermediate stage nonexudative age-related macular degeneration of both eyes 06/01/2021   Type 2 diabetes mellitus with diabetic polyneuropathy, with long-term current use of insulin (HCC) 03/24/2021   Hypokalemia    Constipation 02/02/2021   Fecal impaction (HCC) 02/01/2021   Wound dehiscence 01/09/2021   Dehiscence of amputation stump (HCC)    Status post percutaneous transluminal coronary angioplasty 01/06/2021   Type II diabetes mellitus, uncontrolled 01/06/2021   Acute pancreatitis 01/06/2021   Diabetic peripheral vascular disease (HCC) 01/06/2021   Encounter for screening for other disorder 01/06/2021   Enlarged prostate 01/06/2021   Foot ulcer, right (HCC) 01/06/2021   Gout 01/06/2021   Headache 01/06/2021   Neck pain 01/06/2021  Hypoglycemia 01/06/2021   Loss of appetite 01/06/2021   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   Peripheral neuropathy 01/06/2021   Sciatica 01/06/2021   Vitamin B12 deficiency 01/06/2021   Vitamin D deficiency 01/06/2021   Weakness 01/06/2021   Diabetes mellitus type 2 with neurological manifestations (HCC) 01/06/2021   Protein-calorie malnutrition, severe 12/26/2020   Acute blood loss anemia 12/26/2020   Prerenal azotemia 12/26/2020   Below-knee amputation of right lower extremity (HCC) 12/12/2020   Gangrene of right foot (HCC)    Diabetic neuropathy (HCC) 11/17/2020   Hyperglycemia due to type 2  diabetes mellitus (HCC) 11/17/2020   Long term (current) use of insulin (HCC) 11/17/2020   Obesity 11/17/2020   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/2019   Hip dislocation, right (HCC) 04/13/2019   Other intervertebral disc degeneration, lumbar region 03/30/2019   CAD (coronary artery disease) 01/30/2019   Tobacco abuse 01/30/2019   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   Burn, foot, second degree, left, initial encounter 06/08/2017   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 03/16/2017   S/P total knee arthroplasty, left 10/26/2016   Hyperlipidemia 09/04/2014   Thrombocytopenia (HCC)    Precordial chest pain 04/05/2014   Coronary atherosclerosis of native coronary artery 10/01/2013   Other and unspecified hyperlipidemia 10/01/2013   Primary hypertension 10/01/2013   Diabetes mellitus (HCC) 10/01/2013   Esophageal reflux 10/01/2013   Hypertrophy of prostate without urinary obstruction and other lower urinary tract symptoms (LUTS) 10/01/2013   Home Medication(s) Prior to Admission medications   Medication Sig Start Date End Date Taking? Authorizing Provider  acetaminophen (TYLENOL) 500 MG tablet Take 1,000 mg by mouth every 6 (six) hours as needed (pain).    [provider]  allopurinol (ZYLOPRIM) 300 MG tablet Take 300 mg by mouth daily.    [provider]  amLODipine (NORVASC) 2.5 MG tablet Take 1 tablet (2.5 mg total) by mouth daily. Patient not taking: Reported on 11/11/2022 04/15/22   Corky Crafts, MD  aspirin EC 81 MG tablet Take 81 mg by mouth daily.    [provider]  Cholecalciferol (VITAMIN D3) 50 MCG (2000 UT) TABS Take 2,000 Units by mouth daily.     [provider]  clopidogrel (PLAVIX) 75 MG tablet Take 1 tablet (75 mg total) by mouth daily. 12/26/20   Love, Evlyn Kanner, PA-C  Continuous Blood Gluc Sensor (DEXCOM G7 SENSOR) MISC 1 Device by Does not apply route as directed. 09/20/22   [name], Konrad Dolores, MD   Cyanocobalamin (B-12) 2500 MCG TABS Take 2,500 mcg by mouth daily.    [provider]  empagliflozin (JARDIANCE) 25 MG TABS tablet TAKE 1 TABLET(25 MG) BY MOUTH DAILY 02/22/23   [name], Konrad Dolores, MD  gabapentin (NEURONTIN) 100 MG capsule Take 2 capsules (200 mg total) by mouth at bedtime. Patient taking differently: Take 600 mg by mouth at bedtime. 12/26/20   Love, Evlyn Kanner, PA-C  gabapentin (NEURONTIN) 400 MG capsule Take 1 capsule (400 mg total) by mouth 3 (three) times daily. Patient taking differently: Take 400 mg by mouth daily. 12/26/20   Love, Evlyn Kanner, PA-C  hydrochlorothiazide (HYDRODIURIL) 12.5 MG tablet Take 1 tablet (12.5 mg total) by mouth daily. 12/26/20   Love, Evlyn Kanner, PA-C  insulin aspart (NOVOLOG FLEXPEN) 100 UNIT/ML FlexPen Max daily 30 units 06/23/22   [name], Konrad Dolores, MD  insulin degludec (TRESIBA FLEXTOUCH) 100 UNIT/ML FlexTouch Pen Inject 12 Units into the skin daily. 11/11/22   [name], Konrad Dolores, MD  Insulin Pen Needle 32G X 4 MM MISC 1 Device by Does not apply route in the morning, at noon, in the evening, and at bedtime. 06/23/22   [name], Konrad Dolores, MD  isosorbide mononitrate (IMDUR) 30 MG 24 hr tablet Take 1 tablet (30 mg total) by mouth daily. Please schedule yearly appointment for future refills. Thank you 12/26/20   Love, Evlyn Kanner, PA-C  losartan (COZAAR) 50 MG tablet Take 1 tablet (50 mg total) by mouth daily. Patient taking differently: Take 25 mg by mouth daily. 12/26/20   Love, Evlyn Kanner, PA-C  metFORMIN (GLUCOPHAGE) 1000 MG tablet Take 1 tablet (1,000 mg total) by mouth 2 (two) times daily. 02/12/22   [name], Konrad Dolores, MD  nitroGLYCERIN (NITROSTAT) 0.4 MG SL tablet Place 0.4 mg under the tongue every 5 (five) minutes x 3 doses as needed for chest pain.     [provider]  ONE TOUCH ULTRA TEST test strip CHECK BLOOD SUGAR ONCE DAILY AS DIRECTED 09/23/17   [provider]  pioglitazone (ACTOS)  30 MG tablet TAKE 1 TABLET(30 MG) BY MOUTH DAILY 04/06/23   [name], Konrad Dolores, MD  polyethylene glycol (MIRALAX) 17 g packet Take 17 g by mouth 2 (two) times daily. 02/03/21   Vassie Loll, MD  potassium chloride SA (KLOR-CON) 20 MEQ tablet Take 1 tablet (20 mEq total) by mouth daily. 02/03/21   Vassie Loll, MD  senna-docusate (SENOKOT-S) 8.6-50 MG tablet Take 1 tablet by mouth at bedtime. 02/03/21   Vassie Loll, MD  terazosin (HYTRIN) 5 MG capsule Take 1 capsule (5 mg total) by mouth at bedtime. Patient taking differently: Take 10 mg by mouth at bedtime. 12/26/20   Love, Evlyn Kanner, PA-C                                                                                                                                    Allergies Simvastatin, Zetia [ezetimibe], and Dilaudid [hydromorphone hcl]  Review of Systems Review of Systems  Constitutional:  Negative for fever.  Respiratory:  Negative for cough and shortness of breath.   Cardiovascular:  Positive for chest pain.  Gastrointestinal:  Negative for heartburn, nausea and vomiting.   As noted in HPI  Physical Exam Vital Signs  I have reviewed the triage vital signs BP (!) 176/82   Pulse 83   Temp 98.5 F (36.9 C)   Resp 19   SpO2 95%  *** Physical Exam Vitals reviewed.  Constitutional:      General: He is not in acute distress.    Appearance: He is well-developed. He is not diaphoretic.  HENT:     Head: Normocephalic and atraumatic.     Nose: Nose normal.  Eyes:     General: No scleral icterus.       Right eye: No discharge.        Left eye: No discharge.     Conjunctiva/sclera: Conjunctivae normal.  Pupils: Pupils are equal, round, and reactive to light.  Cardiovascular:     Rate and Rhythm: Normal rate and regular rhythm.     Heart sounds: No murmur heard.    No friction rub. No gallop.  Pulmonary:     Effort: Pulmonary effort is normal. No respiratory distress.     Breath sounds: Normal breath sounds. No  stridor. No rales.  Abdominal:     General: There is no distension.     Palpations: Abdomen is soft.     Tenderness: There is no abdominal tenderness.  Musculoskeletal:        General: No tenderness.     Cervical back: Normal range of motion and neck supple.       Legs:  Skin:    General: Skin is warm and dry.     Findings: No erythema or rash.  Neurological:     Mental Status: He is alert and oriented to person, place, and time.     ED Results and Treatments Labs (all labs ordered are listed, but only abnormal results are displayed) Labs Reviewed  BASIC METABOLIC PANEL - Abnormal; Notable for the following components:      Result Value   Glucose, Bld 304 (*)    BUN 28 (*)    Creatinine, Ser 1.35 (*)    GFR, Estimated 52 (*)    All other components within normal limits  CBC - Abnormal; Notable for the following components:   RBC 3.47 (*)    Hemoglobin 10.7 (*)    HCT 32.2 (*)    RDW 16.4 (*)    Platelets 114 (*)    All other components within normal limits  TROPONIN I (HIGH SENSITIVITY)                                                                                                                         EKG  EKG Interpretation Date/Time:  Friday April 22 2023 22:54:57 EDT Ventricular Rate:  106 PR Interval:  186 QRS Duration:  136 QT Interval:  356 QTC Calculation: 472 R Axis:   95  Text Interpretation: Sinus tachycardia Right bundle branch block Inferior infarct (cited on or before 10-Apr-2019) Anterior infarct , age undetermined Abnormal ECG When compared with ECG of 21-Feb-2023 12:30, Premature atrial complexes are no longer Present Anterior infarct is now Present T wave inversion more evident in Inferior leads Confirmed by Drema Pry [phone]) on 04/22/2023 11:39:06 PM       Radiology DG Chest Port 1 View  Result Date: 04/22/2023 CLINICAL DATA:  Chest pain. EXAM: PORTABLE CHEST 1 VIEW COMPARISON:  01/30/2021. FINDINGS: The heart size and mediastinal  contours are within normal limits. There is atherosclerotic calcification of the aorta. No consolidation, effusion, or pneumothorax. No acute osseous abnormality. Surgical clips are present in the right upper quadrant. IMPRESSION: No active disease. Electronically Signed   By: Thornell Sartorius M.D.   On: 04/22/2023 23:24    Medications Ordered in ED  Medications  aspirin chewable tablet 324 mg (has no administration in time range)   Procedures Procedures  (including critical care time) Medical Decision Making / ED Course   Medical Decision Making Amount and/or Complexity of Data Reviewed Labs: ordered. Radiology: ordered.    ***    Final Clinical Impression(s) / ED Diagnoses Final diagnoses:  None    This chart was dictated using voice recognition software.  Despite best efforts to proofread,  errors can occur which can change the documentation meaning.

## 2023-04-22 NOTE — ED Triage Notes (Addendum)
Pt presents to ED POV. Pt c/o L CP is does not radiate. Pt reports taking 3 nirtoglycerin's today within 3 hours w/ relief. Hx of 6 heart stents

## 2023-04-23 ENCOUNTER — Other Ambulatory Visit: Payer: Self-pay

## 2023-04-23 ENCOUNTER — Encounter (HOSPITAL_COMMUNITY): Payer: Self-pay | Admitting: Internal Medicine

## 2023-04-23 DIAGNOSIS — E118 Type 2 diabetes mellitus with unspecified complications: Secondary | ICD-10-CM

## 2023-04-23 DIAGNOSIS — I200 Unstable angina: Principal | ICD-10-CM

## 2023-04-23 DIAGNOSIS — E1122 Type 2 diabetes mellitus with diabetic chronic kidney disease: Secondary | ICD-10-CM | POA: Diagnosis present

## 2023-04-23 DIAGNOSIS — R079 Chest pain, unspecified: Secondary | ICD-10-CM | POA: Diagnosis present

## 2023-04-23 DIAGNOSIS — I129 Hypertensive chronic kidney disease with stage 1 through stage 4 chronic kidney disease, or unspecified chronic kidney disease: Secondary | ICD-10-CM | POA: Diagnosis present

## 2023-04-23 DIAGNOSIS — E785 Hyperlipidemia, unspecified: Secondary | ICD-10-CM

## 2023-04-23 DIAGNOSIS — E7800 Pure hypercholesterolemia, unspecified: Secondary | ICD-10-CM | POA: Diagnosis not present

## 2023-04-23 DIAGNOSIS — Z8679 Personal history of other diseases of the circulatory system: Secondary | ICD-10-CM

## 2023-04-23 DIAGNOSIS — Z95818 Presence of other cardiac implants and grafts: Secondary | ICD-10-CM | POA: Diagnosis not present

## 2023-04-23 DIAGNOSIS — H353231 Exudative age-related macular degeneration, bilateral, with active choroidal neovascularization: Secondary | ICD-10-CM | POA: Diagnosis present

## 2023-04-23 DIAGNOSIS — I452 Bifascicular block: Secondary | ICD-10-CM | POA: Diagnosis present

## 2023-04-23 DIAGNOSIS — G8929 Other chronic pain: Secondary | ICD-10-CM | POA: Diagnosis present

## 2023-04-23 DIAGNOSIS — I2510 Atherosclerotic heart disease of native coronary artery without angina pectoris: Secondary | ICD-10-CM | POA: Diagnosis not present

## 2023-04-23 DIAGNOSIS — Z794 Long term (current) use of insulin: Secondary | ICD-10-CM | POA: Diagnosis not present

## 2023-04-23 DIAGNOSIS — Z96643 Presence of artificial hip joint, bilateral: Secondary | ICD-10-CM | POA: Diagnosis present

## 2023-04-23 DIAGNOSIS — J309 Allergic rhinitis, unspecified: Secondary | ICD-10-CM | POA: Diagnosis present

## 2023-04-23 DIAGNOSIS — E876 Hypokalemia: Secondary | ICD-10-CM | POA: Diagnosis present

## 2023-04-23 DIAGNOSIS — Z87891 Personal history of nicotine dependence: Secondary | ICD-10-CM | POA: Diagnosis not present

## 2023-04-23 DIAGNOSIS — E1151 Type 2 diabetes mellitus with diabetic peripheral angiopathy without gangrene: Secondary | ICD-10-CM | POA: Diagnosis present

## 2023-04-23 DIAGNOSIS — N179 Acute kidney failure, unspecified: Secondary | ICD-10-CM | POA: Insufficient documentation

## 2023-04-23 DIAGNOSIS — M1A9XX0 Chronic gout, unspecified, without tophus (tophi): Secondary | ICD-10-CM | POA: Diagnosis not present

## 2023-04-23 DIAGNOSIS — Z85828 Personal history of other malignant neoplasm of skin: Secondary | ICD-10-CM | POA: Diagnosis not present

## 2023-04-23 DIAGNOSIS — E1140 Type 2 diabetes mellitus with diabetic neuropathy, unspecified: Secondary | ICD-10-CM | POA: Diagnosis present

## 2023-04-23 DIAGNOSIS — I10 Essential (primary) hypertension: Secondary | ICD-10-CM | POA: Diagnosis not present

## 2023-04-23 DIAGNOSIS — M109 Gout, unspecified: Secondary | ICD-10-CM | POA: Diagnosis present

## 2023-04-23 DIAGNOSIS — Z8616 Personal history of COVID-19: Secondary | ICD-10-CM | POA: Diagnosis not present

## 2023-04-23 DIAGNOSIS — Z888 Allergy status to other drugs, medicaments and biological substances status: Secondary | ICD-10-CM | POA: Diagnosis not present

## 2023-04-23 DIAGNOSIS — I350 Nonrheumatic aortic (valve) stenosis: Secondary | ICD-10-CM | POA: Diagnosis not present

## 2023-04-23 DIAGNOSIS — Z951 Presence of aortocoronary bypass graft: Secondary | ICD-10-CM | POA: Diagnosis not present

## 2023-04-23 DIAGNOSIS — R7989 Other specified abnormal findings of blood chemistry: Secondary | ICD-10-CM | POA: Diagnosis not present

## 2023-04-23 DIAGNOSIS — E1165 Type 2 diabetes mellitus with hyperglycemia: Secondary | ICD-10-CM | POA: Diagnosis present

## 2023-04-23 DIAGNOSIS — I739 Peripheral vascular disease, unspecified: Secondary | ICD-10-CM | POA: Diagnosis not present

## 2023-04-23 DIAGNOSIS — Z79899 Other long term (current) drug therapy: Secondary | ICD-10-CM | POA: Diagnosis not present

## 2023-04-23 DIAGNOSIS — K861 Other chronic pancreatitis: Secondary | ICD-10-CM | POA: Diagnosis present

## 2023-04-23 DIAGNOSIS — I25110 Atherosclerotic heart disease of native coronary artery with unstable angina pectoris: Secondary | ICD-10-CM | POA: Diagnosis present

## 2023-04-23 DIAGNOSIS — I2583 Coronary atherosclerosis due to lipid rich plaque: Secondary | ICD-10-CM | POA: Diagnosis not present

## 2023-04-23 DIAGNOSIS — N1831 Chronic kidney disease, stage 3a: Secondary | ICD-10-CM | POA: Diagnosis present

## 2023-04-23 DIAGNOSIS — Z89511 Acquired absence of right leg below knee: Secondary | ICD-10-CM | POA: Diagnosis not present

## 2023-04-23 LAB — URINALYSIS, ROUTINE W REFLEX MICROSCOPIC
Bacteria, UA: NONE SEEN
Bilirubin Urine: NEGATIVE
Glucose, UA: >1000 mg/dL — AB
Hgb urine dipstick: NEGATIVE
Ketones, ur: NEGATIVE mg/dL
Leukocytes,Ua: NEGATIVE
Nitrite: NEGATIVE
Protein, ur: NEGATIVE mg/dL
Specific Gravity, Urine: 1.023 (ref 1.005–1.030)
pH: 6.5 (ref 5.0–8.0)

## 2023-04-23 LAB — GLUCOSE, CAPILLARY
Glucose-Capillary: 150 mg/dL — ABNORMAL HIGH (ref 70–99)
Glucose-Capillary: 253 mg/dL — ABNORMAL HIGH (ref 70–99)

## 2023-04-23 LAB — TROPONIN I (HIGH SENSITIVITY)
Troponin I (High Sensitivity): 12 ng/L (ref <18)
Troponin I (High Sensitivity): 20 ng/L — ABNORMAL HIGH (ref <18)
Troponin I (High Sensitivity): 19 ng/L — ABNORMAL HIGH (ref <18)

## 2023-04-23 LAB — HEPARIN LEVEL (UNFRACTIONATED): Heparin Unfractionated: <0.1 IU/mL — ABNORMAL LOW (ref 0.30–0.70)

## 2023-04-23 MED ORDER — ROSUVASTATIN CALCIUM 20 MG PO TABS
20.0000 mg | ORAL_TABLET | Freq: Every day | ORAL | Status: DC
  Administered 2023-04-24 – 2023-04-27 (×4): 20 mg via ORAL
  Filled 2023-04-23 (×4): qty 1

## 2023-04-23 MED ORDER — HEPARIN BOLUS VIA INFUSION
2500.0000 [IU] | Freq: Once | INTRAVENOUS | Status: AC
  Administered 2023-04-23: 2500 [IU] via INTRAVENOUS
  Filled 2023-04-23: qty 2500

## 2023-04-23 MED ORDER — MORPHINE SULFATE (PF) 2 MG/ML IV SOLN: 2.0000 mg | INTRAVENOUS | Status: DC | PRN

## 2023-04-23 MED ORDER — ALLOPURINOL 300 MG PO TABS
300.0000 mg | ORAL_TABLET | Freq: Every day | ORAL | Status: DC
  Administered 2023-04-23 – 2023-04-27 (×5): 300 mg via ORAL
  Filled 2023-04-23 (×5): qty 1

## 2023-04-23 MED ORDER — AMLODIPINE BESYLATE 2.5 MG PO TABS
2.5000 mg | ORAL_TABLET | Freq: Every day | ORAL | Status: DC
  Administered 2023-04-23 – 2023-04-25 (×3): 2.5 mg via ORAL
  Filled 2023-04-23 (×4): qty 1

## 2023-04-23 MED ORDER — CLOPIDOGREL BISULFATE 75 MG PO TABS
75.0000 mg | ORAL_TABLET | Freq: Every day | ORAL | Status: DC
  Administered 2023-04-23 – 2023-04-25 (×3): 75 mg via ORAL
  Filled 2023-04-23 (×3): qty 1

## 2023-04-23 MED ORDER — NITROGLYCERIN 0.4 MG SL SUBL
0.4000 mg | SUBLINGUAL_TABLET | SUBLINGUAL | Status: DC | PRN
  Administered 2023-04-26: 0.4 mg via SUBLINGUAL
  Filled 2023-04-23: qty 1

## 2023-04-23 MED ORDER — INSULIN ASPART 100 UNIT/ML IJ SOLN
0.0000 [IU] | Freq: Three times a day (TID) | INTRAMUSCULAR | Status: DC
  Administered 2023-04-23: 5 [IU] via SUBCUTANEOUS
  Administered 2023-04-24: 2 [IU] via SUBCUTANEOUS
  Administered 2023-04-24: 5 [IU] via SUBCUTANEOUS
  Administered 2023-04-24: 7 [IU] via SUBCUTANEOUS
  Administered 2023-04-25 (×2): 3 [IU] via SUBCUTANEOUS
  Administered 2023-04-25 – 2023-04-26 (×2): 2 [IU] via SUBCUTANEOUS
  Administered 2023-04-26: 5 [IU] via SUBCUTANEOUS
  Administered 2023-04-26: 7 [IU] via SUBCUTANEOUS
  Administered 2023-04-27: 3 [IU] via SUBCUTANEOUS
  Administered 2023-04-27: 9 [IU] via SUBCUTANEOUS

## 2023-04-23 MED ORDER — SODIUM CHLORIDE 0.9% FLUSH
3.0000 mL | Freq: Two times a day (BID) | INTRAVENOUS | Status: DC
  Administered 2023-04-23 – 2023-04-26 (×4): 3 mL via INTRAVENOUS

## 2023-04-23 MED ORDER — ISOSORBIDE MONONITRATE ER 30 MG PO TB24
30.0000 mg | ORAL_TABLET | Freq: Every day | ORAL | Status: DC
  Administered 2023-04-23 – 2023-04-26 (×4): 30 mg via ORAL
  Filled 2023-04-23 (×5): qty 1

## 2023-04-23 MED ORDER — INSULIN ASPART 100 UNIT/ML IJ SOLN
0.0000 [IU] | Freq: Every day | INTRAMUSCULAR | Status: DC
  Administered 2023-04-24: 3 [IU] via SUBCUTANEOUS
  Administered 2023-04-24 – 2023-04-25 (×2): 5 [IU] via SUBCUTANEOUS
  Administered 2023-04-26: 2 [IU] via SUBCUTANEOUS

## 2023-04-23 MED ORDER — HEPARIN (PORCINE) 25000 UT/250ML-% IV SOLN
1100.0000 [IU]/h | INTRAVENOUS | Status: DC
  Administered 2023-04-23: 900 [IU]/h via INTRAVENOUS
  Administered 2023-04-24 – 2023-04-25 (×2): 1100 [IU]/h via INTRAVENOUS
  Filled 2023-04-23 (×3): qty 250

## 2023-04-23 MED ORDER — ASPIRIN 81 MG PO TBEC
81.0000 mg | DELAYED_RELEASE_TABLET | Freq: Every day | ORAL | Status: DC
  Administered 2023-04-23 – 2023-04-27 (×4): 81 mg via ORAL
  Filled 2023-04-23 (×5): qty 1

## 2023-04-23 MED ORDER — SODIUM CHLORIDE 0.9 % IV SOLN: 250.0000 mL | INTRAVENOUS | Status: DC | PRN

## 2023-04-23 MED ORDER — ACETAMINOPHEN 650 MG RE SUPP: 650.0000 mg | Freq: Four times a day (QID) | RECTAL | Status: DC | PRN

## 2023-04-23 MED ORDER — METOPROLOL SUCCINATE ER 25 MG PO TB24
25.0000 mg | ORAL_TABLET | Freq: Every day | ORAL | Status: DC
  Administered 2023-04-23: 25 mg via ORAL
  Filled 2023-04-23 (×3): qty 1

## 2023-04-23 MED ORDER — NITROGLYCERIN 0.4 MG SL SUBL: 0.4000 mg | SUBLINGUAL_TABLET | SUBLINGUAL | Status: DC | PRN

## 2023-04-23 MED ORDER — SODIUM CHLORIDE 0.9% FLUSH: 3.0000 mL | INTRAVENOUS | Status: DC | PRN

## 2023-04-23 MED ORDER — LOSARTAN POTASSIUM 25 MG PO TABS
25.0000 mg | ORAL_TABLET | Freq: Every day | ORAL | Status: DC
  Administered 2023-04-23 – 2023-04-24 (×2): 25 mg via ORAL
  Filled 2023-04-23 (×2): qty 1

## 2023-04-23 MED ORDER — LACTATED RINGERS IV SOLN: INTRAVENOUS | Status: DC

## 2023-04-23 MED ORDER — HYDRALAZINE HCL 20 MG/ML IJ SOLN: 10.0000 mg | Freq: Three times a day (TID) | INTRAMUSCULAR | Status: DC | PRN

## 2023-04-23 MED ORDER — ACETAMINOPHEN 325 MG PO TABS
650.0000 mg | ORAL_TABLET | Freq: Four times a day (QID) | ORAL | Status: DC | PRN
  Filled 2023-04-23: qty 2

## 2023-04-23 MED ORDER — ONDANSETRON HCL 4 MG PO TABS: 4.0000 mg | ORAL_TABLET | Freq: Four times a day (QID) | ORAL | Status: DC | PRN

## 2023-04-23 MED ORDER — ONDANSETRON HCL 4 MG/2ML IJ SOLN: 4.0000 mg | Freq: Four times a day (QID) | INTRAMUSCULAR | Status: DC | PRN

## 2023-04-23 NOTE — Plan of Care (Signed)

## 2023-04-23 NOTE — Progress Notes (Signed)
PROGRESS NOTE    Keith Espinoza  ZOX:[id number] DOB: 31-Jul-1941 DOA: 04/22/2023 PCP: Emilio Aspen, MD   Brief Narrative:  Keith Espinoza is a 82 y.o. male with medical history significant of CAD s/p stenting RCA 2004 with staged DES-LAD and Cfx 2004, DES RCA 2012, DES mCfx, PTCA dCfx 2013, low risk NST 04/2014, insulin-dependent diabetes mellitus type 2, extensive peripheral vascular disease s/p right BKA and chronic pancreatitis presented to [facility] emergency department with complaining of chest pain.  Patient states he took 3 nitro tablets over the previous few hours prior to hospitalization with resolution of chest pain.  Given risk factors patient was admitted to hospital service with consult to cardiology.  Assessment & Plan:   Principal Problem:   Chest pain Active Problems:   Unstable angina (HCC)   History of CAD (coronary artery disease)   Essential hypertension   Type 2 diabetes mellitus with complication, without long-term current use of insulin (HCC)   Hyperlipidemia   Gout   AKI (acute kidney injury) (HCC)   Unstable angina  History of CAD heart cath x 6 History of CAD status post post DES stent (currently on aspirin and Plavix) -Cardiology consulted, appreciate insight recommendations -Without ST changes, troponin minimally elevated, peak at 20 -Consideration for cardiac catheterization Monday 7/22 -Continue aggressive medication control to avoid hypotension -Heparin ongoing -Continue amlodipine, aspirin 81, Plavix, isosorbide, losartan, metoprolol, Crestor -Defer to cardiology for timing of aspirin and Plavix in regards to cardiac catheterization  Essential hypertension Elevated blood pressure -Patient has labile blood pressures at home -admits to discontinuing amlodipine -Continue medications as above, blood pressure currently well-controlled   AKI on CKD stage IIIa - Creatinine 1.35 on presentation.  Baseline GFR 49-60 - Continue losartan -hold  HCTZ in the setting of AKI -Discontinue LR, monitor creatinine, hold nephrotoxins   PVD status post right-sided BKA -Reports statin intolerance with simvastatin, discussed improved profile with Crestor but patient refuse dose here -defer to cardiology   Insulin-dependent diabetes mellitus type 2 -A1c 7.1 -Continue to hold home medications in the setting of AKI -Sliding scale insulin initiated now that diet has been initiated  Gout - Resumed home allopurinol 300 mg daily   DVT prophylaxis: SCDs Start: 04/23/23 0544 Code Status:   Code Status: Full Code Family Communication: None present  Status is: Inpatient  Dispo: The patient is from: Home              Anticipated d/c is to: To be determined              Anticipated d/c date is: To be determined              Patient currently not medically stable for discharge  Consultants:  Cardiology  Procedures:  Cardiac cath planned 7/22  Antimicrobials:  None  Subjective: No acute issues or events overnight, denies any further episodes of chest pain  Objective: Vitals:   04/23/23 0315 04/23/23 0330 04/23/23 0345 04/23/23 0550  BP: (!) 178/63 (!) 169/62 (!) 181/61 (!) 140/48  Pulse: (!) 57 64 62 (!) 57  Resp: 14 15 15 14   Temp:    98.5 F (36.9 C)  TempSrc:    Oral  SpO2: 98% 97% 97% 96%  Weight:    74 kg  Height:    5\' 10"  (1.778 m)   No intake or output data in the 24 hours ending 04/23/23 0828 Filed Weights   04/23/23 0550  Weight: 74 kg    Examination:  General:  Pleasantly resting in bed, No acute distress. HEENT:  Normocephalic atraumatic.  Sclerae nonicteric, noninjected.  Extraocular movements intact bilaterally. Neck:  Without mass or deformity.  Trachea is midline. Lungs:  Clear to auscultate bilaterally without rhonchi, wheeze, or rales. Heart:  Regular rate and rhythm.  Without murmurs, rubs, or gallops. Abdomen:  Soft, nontender, nondistended.  Without guarding or rebound. Extremities: Right BKA Skin:   Warm and dry, no erythema.    LOS: 0 days   Time spent:  Azucena Fallen, DO [facility]  If 7PM-7AM, please contact night-coverage [email or web address]  04/23/2023, 8:28 AM

## 2023-04-23 NOTE — Consult Note (Signed)
Cardiology Consultation   Patient ID: Keith Espinoza MRN: [id number]; DOB: Nov 01, 1940  Admit date: 04/22/2023 Date of Consult: 04/23/2023  PCP:  Emilio Aspen, MD   [facility] Providers Cardiologist:  Lance Muss, MD   {     Patient Profile:   Keith Espinoza is a 82 y.o. male with a hx of coronary artery disease, diabetes mellitus, hypertension, peripheral vascular disease, s/p RBKA who is being seen 04/23/2023 for the evaluation of chest pain at the request of Carma Leaven MD.  History of Present Illness:   Patient has a history of coronary artery disease with prior PCI of his right coronary artery, LAD and circumflex in 2004, right coronary artery in 2012, circumflex in 2013.  Nuclear study July 2015 showed ejection fraction 57%, inferolateral scar but no ischemia.  Patient ambulates with his prosthesis.  He does not have dyspnea on exertion, orthopnea, PND, pedal edema, exertional chest pain or syncope.  Last evening after getting out of the shower he developed pain in the left breast area described as a dull sensation.  It did not radiate.  It was not pleuritic or positional and not related to food.  No associated symptoms.  Lasted 5 to 7 minutes and resolved with nitroglycerin.  He subsequently had 2 more episodes each resolving with nitroglycerin and presented to the hospital for further management.  Also note he had an episode 5 days ago while in bed resolving with nitroglycerin.   Past Medical History:  Diagnosis Date   Allergic rhinitis    Allergic rhinitis    Arthritis    Basal cell carcinoma 11/01/2019    bcc left chest treatment [place] cx3 65fu    Chronic leg pain    right   Chronic lower back pain    Coronary artery disease    a. Stenting to RCA 2004; staged DES to LAD and Cx 2004. DES to [facility] 2012. b. DES to mCx, PTCA to dCx 11/2011. c. Lateral wall MI 2013 s/p PTCA to distal Cx & DES to mid OM2 11/2011. d. Low risk nuc 04/2014, EF wnl.    COVID-19    Diabetes mellitus    Insulin dependent   Diabetic neuropathy (HCC)    MILD   Diverticulosis    Dysrhythmia    [name] syndrome    Gout    right wrist; right foot; right elbow; have had it since 1970's   H/O hiatal hernia    Heart murmur    History of echocardiogram    aortic sclerosis per echo 12/09 EF 65%, otherwise normal   History of hemorrhoids    BLEEDING   History of kidney stones    h/o   Hypertension    Diagnosed 1995    Myocardial infarction [facility])    Pancreatic pseudocyst    a. s/p remote drainage 2006.   Thrombocytopenia (HCC)    Seen on oldest labs in system from 2004   Vitamin B 12 deficiency    orally replaced    Past Surgical History:  Procedure Laterality Date   ABDOMINAL AORTOGRAM W/LOWER EXTREMITY Bilateral 08/08/2020   Procedure: ABDOMINAL AORTOGRAM W/LOWER EXTREMITY;  Surgeon: Chuck Hint, MD;  Location: [facility] INVASIVE CV LAB;  Service: Cardiovascular;  Laterality: Bilateral;   AMPUTATION Right 12/12/2020   Procedure: RIGHT BELOW KNEE AMPUTATION;  Surgeon: Nadara Mustard, MD;  Location: [facility] OR;  Service: Orthopedics;  Laterality: Right;   BACK SURGERY     "total of 3 times" S/P  fall    CARPAL TUNNEL RELEASE Bilateral    CHOLECYSTECTOMY  1990's   COLONOSCOPY     CORONARY ANGIOPLASTY  11/11/11   CORONARY ANGIOPLASTY WITH STENT PLACEMENT  09/30/2011   "1 then; makes a total of 4"   CORONARY ANGIOPLASTY WITH STENT PLACEMENT  11/11/11   "1; makes a total of 5"   INGUINAL HERNIA REPAIR  2003   right   JOINT REPLACEMENT Right 04/03/2002   hip replacment   KNEE ARTHROSCOPY  1990's   left   LEFT HEART CATHETERIZATION WITH CORONARY ANGIOGRAM N/A 09/30/2011   Procedure: LEFT HEART CATHETERIZATION WITH CORONARY ANGIOGRAM;  Surgeon: Corky Crafts, MD;  Location: [facility] CATH LAB;  Service: Cardiovascular;  Laterality: N/A;  possible PCI   LEFT HEART CATHETERIZATION WITH CORONARY ANGIOGRAM N/A 11/15/2011   Procedure: LEFT HEART CATHETERIZATION  WITH CORONARY ANGIOGRAM;  Surgeon: Corky Crafts, MD;  Location: [facility] CATH LAB;  Service: Cardiovascular;  Laterality: N/A;   PERCUTANEOUS CORONARY STENT INTERVENTION (PCI-S)  09/30/2011   Procedure: PERCUTANEOUS CORONARY STENT INTERVENTION (PCI-S);  Surgeon: Corky Crafts, MD;  Location: [facility] CATH LAB;  Service: Cardiovascular;;   PERCUTANEOUS CORONARY STENT INTERVENTION (PCI-S) N/A 11/11/2011   Procedure: PERCUTANEOUS CORONARY STENT INTERVENTION (PCI-S);  Surgeon: Corky Crafts, MD;  Location: [facility] CATH LAB;  Service: Cardiovascular;  Laterality: N/A;   PERIPHERAL VASCULAR BALLOON ANGIOPLASTY Right 08/08/2020   Procedure: PERIPHERAL VASCULAR BALLOON ANGIOPLASTY;  Surgeon: Chuck Hint, MD;  Location: [facility] INVASIVE CV LAB;  Service: Cardiovascular;  Laterality: Right;  Posterior tibial    SHOULDER SURGERY Right    X 2   STUMP REVISION Right 01/09/2021   Procedure: REVISION RIGHT BELOW KNEE AMPUTATION;  Surgeon: Nadara Mustard, MD;  Location: [facility] OR;  Service: Orthopedics;  Laterality: Right;   TONSILLECTOMY  ~ 1948   TOTAL HIP REVISION Right 04/13/2019   Procedure: RIGHT TOTAL HIP REVISION-POSTERIOR  APPROACH LATERAL;  Surgeon: Eldred Manges, MD;  Location: [facility] OR;  Service: Orthopedics;  Laterality: Right;   TOTAL KNEE ARTHROPLASTY Left 07/23/2016   Procedure: LEFT TOTAL KNEE ARTHROPLASTY;  Surgeon: Eldred Manges, MD;  Location: [facility] OR;  Service: Orthopedics;  Laterality: Left;      Inpatient Medications: Scheduled Meds:  allopurinol  300 mg Oral Daily   amLODipine  2.5 mg Oral Daily   aspirin EC  81 mg Oral Daily   clopidogrel  75 mg Oral Daily   isosorbide mononitrate  30 mg Oral Daily   sodium chloride flush  3 mL Intravenous Q12H   Continuous Infusions:  sodium chloride     lactated ringers 75 mL/hr at 04/23/23 0634   PRN Meds: sodium chloride, acetaminophen **OR** acetaminophen, hydrALAZINE, morphine injection, nitroGLYCERIN, ondansetron **OR** ondansetron (ZOFRAN)  IV, sodium chloride flush  Allergies:    Allergies  Allergen Reactions   Simvastatin Other (See Comments)    SEVERE MYALGIAS    Zetia [Ezetimibe] Other (See Comments)    MYALGIAS   Dilaudid [Hydromorphone Hcl] Other (See Comments)    hallucination    Social History:   Social History   Socioeconomic History   Marital status: Married    Spouse name: Not on file   Number of children: Not on file   Years of education: Not on file   Highest education level: Not on file  Occupational History   Occupation: Retired  Tobacco Use   Smoking status: Former   Smokeless tobacco: Current    Types: Chew   Tobacco comments:  quit 60 years ago  Vaping Use   Vaping status: Never Used  Substance and Sexual Activity   Alcohol use: No   Drug use: No   Sexual activity: Not Currently  Other Topics Concern   Not on file  Social History Narrative   Not on file   Social Determinants of Health   Financial Resource Strain: Not on file  Food Insecurity: No Food Insecurity (04/23/2023)   Hunger Vital Sign    Worried About Running Out of Food in the Last Year: Never true    Ran Out of Food in the Last Year: Never true  Transportation Needs: No Transportation Needs (04/23/2023)   PRAPARE - Administrator, Civil Service (Medical): No    Lack of Transportation (Non-Medical): No  Physical Activity: Not on file  Stress: Not on file  Social Connections: Not on file  Intimate Partner Violence: Not At Risk (04/23/2023)   Humiliation, Afraid, Rape, and Kick questionnaire    Fear of Current or Ex-Partner: No    Emotionally Abused: No    Physically Abused: No    Sexually Abused: No    Family History:    Family History  Problem Relation Age of Onset   Diabetes Mother    Hyperlipidemia Mother    Hypertension Mother    Cancer Father    Hypertension Father    Diabetes Sister    Hypertension Sister    Cancer Brother    Heart attack Neg Hx      ROS:  Please see the history of  present illness.  No fevers, chills, productive cough or hemoptysis. All other ROS reviewed and negative.     Physical Exam/Data:   Vitals:   04/23/23 0330 04/23/23 0345 04/23/23 0550 04/23/23 0842  BP: (!) 169/62 (!) 181/61 (!) 140/48 (!) 164/65  Pulse: 64 62 (!) 57 68  Resp: 15 15 14 20   Temp:   98.5 F (36.9 C) 98 F (36.7 C)  TempSrc:   Oral Oral  SpO2: 97% 97% 96% 97%  Weight:   74 kg   Height:   5\' 10"  (1.778 m)     Intake/Output Summary (Last 24 hours) at 04/23/2023 1224 Last data filed at 04/23/2023 0847 Gross per 24 hour  Intake --  Output 500 ml  Net -500 ml      04/23/2023    5:50 AM 04/01/2023   10:20 AM 02/16/2023    2:49 PM  Last 3 Weights  Weight (lbs) 163 lb 2.3 oz 161 lb 165 lb  Weight (kg) 74 kg 73.029 kg 74.844 kg     Body mass index is 23.41 kg/m.  General:  Well nourished, well developed, in no acute distress HEENT: normal Neck: no JVD Vascular: No carotid bruits; Distal pulse left lower extremity not palpable. Cardiac:  normal S1, S2; RRR; 2/6 systolic murmur left sternal border. Lungs:  clear to auscultation bilaterally, no wheezing, rhonchi or rales  Abd: soft, nontender, no hepatomegaly  Ext: no edema; status post right BKA. Musculoskeletal: Status post right BKA. Skin: warm and dry  Neuro:  CNs 2-12 intact, no focal abnormalities noted Psych:  Normal affect   EKG:  The EKG was personally reviewed and demonstrates: Sinus tachycardia, right bundle branch block, inferior infarct. Telemetry:  Telemetry was personally reviewed and demonstrates: Sinus rhythm.   Laboratory Data:  High Sensitivity Troponin:   Recent Labs  Lab 04/22/23 2256 04/23/23 0056 04/23/23 0650 04/23/23 0801  TROPONINIHS 10 12 20* 19*  Chemistry Recent Labs  Lab 04/22/23 2256  NA 141  K 3.7  CL 102  CO2 28  GLUCOSE 304*  BUN 28*  CREATININE 1.35*  CALCIUM 10.0  GFRNONAA 52*  ANIONGAP 11    Hematology Recent Labs  Lab 04/22/23 2256  WBC 4.1   RBC 3.47*  HGB 10.7*  HCT 32.2*  MCV 92.8  MCH 30.8  MCHC 33.2  RDW 16.4*  PLT 114*     Radiology/Studies:  DG Chest Port 1 View  Result Date: 04/22/2023 CLINICAL DATA:  Chest pain. EXAM: PORTABLE CHEST 1 VIEW COMPARISON:  01/30/2021. FINDINGS: The heart size and mediastinal contours are within normal limits. There is atherosclerotic calcification of the aorta. No consolidation, effusion, or pneumothorax. No acute osseous abnormality. Surgical clips are present in the right upper quadrant. IMPRESSION: No active disease. Electronically Signed   By: Thornell Sartorius M.D.   On: 04/22/2023 23:24     Assessment and Plan:   Unstable angina-patient's symptoms have both typical and atypical features.  However he has multiple risk factors including diabetes mellitus as well as history of coronary disease.  Electrocardiogram shows no ST changes.  Troponin minimally elevated.  1 episode occurred while he was in the bed.  Feel definitive evaluation is warranted.  The risk and benefits of cardiac catheterization were discussed including myocardial infarction, CVA and death and he agrees to proceed.  Continue aspirin, Plavix and add statin.  Add Toprol 25 mg daily. Add heparin.  Murmur-sounds to have aortic stenosis on examination.  Will plan echocardiogram to further assess. Coronary artery disease-continue aspirin and Plavix which are home medications.  He did not tolerate Zocor previously.  Will try Crestor. Hyperlipidemia-as outlined above he did not tolerate Zocor or Zetia in the past.  Will try Crestor 20 mg daily to see if he tolerates.  If so check lipids and liver in 8 weeks. Hypertension-blood pressure elevated.  Will hold HCTZ but resume losartan 25 mg daily.  We are also adding Toprol.  Follow and adjust as needed Peripheral vascular disease-add statin as outlined above.   Risk Assessment/Risk Scores:   TIMI Risk Score for Unstable Angina or Non-ST Elevation MI:   The patient's TIMI risk  score is 5, which indicates a 26% risk of all cause mortality, new or recurrent myocardial infarction or need for urgent revascularization in the next 14 days.{   For questions or updates, please contact [facility] Please consult [email or web address] for contact info under   Signed, Olga Millers, MD  04/23/2023 12:24 PM

## 2023-04-23 NOTE — H&P (Addendum)
History and Physical    Keith Espinoza YQM:[id number] DOB: 1940/12/10 DOA: 04/22/2023  PCP: Emilio Aspen, MD   Patient coming from: Home   Chief Complaint:  Chief Complaint  Patient presents with   Chest Pain    HPI:  Keith Espinoza is a 82 y.o. male with medical history significant of CAD s/p stenting RCA 2004 with staged DES-LAD and Cfx 2004, DES RCA 2012, DES mCfx, PTCA dCfx 2013, low risk NST 04/2014, insulin-dependent diabetes mellitus type 2, extensive peripheral vascular disease s/p right BKA and chronic pancreatitis presented to [facility] with complaining of chest pain.  Patient reported he was taking a shower around 8:30 PM and experienced some chest pain took some nitroglycerin it improved and then again second and third episode to happen until 10:30 PM he took nitroglycerin x 3.  Last week he has to take nitroglycerin x 1.  Reported taking blood pressure regimen except amlodipine has been taken out by the primary care doctor as he develops occasional hypotension.  Denies any headache, palpitation, dyspnea, orthopnea, PND and lower extremity swelling.  ED Course:  Initial presentation to ED heart rate 99, respiratory rate 17, blood pressure 147/78 and O2 sat 99% room air. Patient blood pressure continued to trended up to 181/61 and heart rate dropped 99 to 57. Lab check, BMP sodium 141, potassium 3.7, chloride 102, bicarb 28, blood glucose 204, BUN 28, creatinine 1.35. CBC WBC 4.1, hemoglobin 10.7 and platelet 114. High sensitive troponin 10 and 12. EKG showed sinus tachycardia heart rate 104 and right bundle branch block.  ED physician Dr. Eudelia Bunch spoke with on-call cardiology fellow Dr. Aron Baba who recommended to transfer patient to [facility] for admission for stress test and possible heart cath.  Hospitalist team has been consulted for admitting the patient.  Review of Systems:  Review of Systems  Constitutional:  Negative for chills, fever,  malaise/fatigue and weight loss.  Respiratory:  Negative for cough.   Cardiovascular:  Positive for chest pain. Negative for palpitations, orthopnea, claudication, leg swelling and PND.  Gastrointestinal:  Negative for heartburn and nausea.  Musculoskeletal:  Negative for myalgias.  Neurological:  Negative for dizziness, tingling, weakness and headaches.  Psychiatric/Behavioral:  The patient is not nervous/anxious.     Past Medical History:  Diagnosis Date   Allergic rhinitis    Allergic rhinitis    Arthritis    Basal cell carcinoma 11/01/2019    bcc left chest treatment TX cx3 60fu    Chronic leg pain    right   Chronic lower back pain    Coronary artery disease    a. Stenting to RCA 2004; staged DES to LAD and Cx 2004. DES to [facility] 2012. b. DES to mCx, PTCA to dCx 11/2011. c. Lateral wall MI 2013 s/p PTCA to distal Cx & DES to mid OM2 11/2011. d. Low risk nuc 04/2014, EF wnl.   COVID-19    Diabetes mellitus    Insulin dependent   Diabetic neuropathy (HCC)    MILD   Diverticulosis    Dysrhythmia    [name] syndrome    Gout    right wrist; right foot; right elbow; have had it since 1970's   H/O hiatal hernia    Heart murmur    History of echocardiogram    aortic sclerosis per echo 12/09 EF 65%, otherwise normal   History of hemorrhoids    BLEEDING   History of kidney stones    h/o   Hypertension  Diagnosed 1995    Myocardial infarction [facility])    Pancreatic pseudocyst    a. s/p remote drainage 2006.   Thrombocytopenia (HCC)    Seen on oldest labs in system from 2004   Vitamin B 12 deficiency    orally replaced    Past Surgical History:  Procedure Laterality Date   ABDOMINAL AORTOGRAM W/LOWER EXTREMITY Bilateral 08/08/2020   Procedure: ABDOMINAL AORTOGRAM W/LOWER EXTREMITY;  Surgeon: Chuck Hint, MD;  Location: [facility] INVASIVE CV LAB;  Service: Cardiovascular;  Laterality: Bilateral;   AMPUTATION Right 12/12/2020   Procedure: RIGHT BELOW KNEE AMPUTATION;   Surgeon: Nadara Mustard, MD;  Location: [facility] OR;  Service: Orthopedics;  Laterality: Right;   BACK SURGERY     "total of 3 times" S/P fall    CARPAL TUNNEL RELEASE Bilateral    CHOLECYSTECTOMY  1990's   COLONOSCOPY     CORONARY ANGIOPLASTY  11/11/11   CORONARY ANGIOPLASTY WITH STENT PLACEMENT  09/30/2011   "1 then; makes a total of 4"   CORONARY ANGIOPLASTY WITH STENT PLACEMENT  11/11/11   "1; makes a total of 5"   INGUINAL HERNIA REPAIR  2003   right   JOINT REPLACEMENT Right 04/03/2002   hip replacment   KNEE ARTHROSCOPY  1990's   left   LEFT HEART CATHETERIZATION WITH CORONARY ANGIOGRAM N/A 09/30/2011   Procedure: LEFT HEART CATHETERIZATION WITH CORONARY ANGIOGRAM;  Surgeon: Corky Crafts, MD;  Location: [facility] CATH LAB;  Service: Cardiovascular;  Laterality: N/A;  possible PCI   LEFT HEART CATHETERIZATION WITH CORONARY ANGIOGRAM N/A 11/15/2011   Procedure: LEFT HEART CATHETERIZATION WITH CORONARY ANGIOGRAM;  Surgeon: Corky Crafts, MD;  Location: [facility] CATH LAB;  Service: Cardiovascular;  Laterality: N/A;   PERCUTANEOUS CORONARY STENT INTERVENTION (PCI-S)  09/30/2011   Procedure: PERCUTANEOUS CORONARY STENT INTERVENTION (PCI-S);  Surgeon: Corky Crafts, MD;  Location: [facility] CATH LAB;  Service: Cardiovascular;;   PERCUTANEOUS CORONARY STENT INTERVENTION (PCI-S) N/A 11/11/2011   Procedure: PERCUTANEOUS CORONARY STENT INTERVENTION (PCI-S);  Surgeon: Corky Crafts, MD;  Location: [facility] CATH LAB;  Service: Cardiovascular;  Laterality: N/A;   PERIPHERAL VASCULAR BALLOON ANGIOPLASTY Right 08/08/2020   Procedure: PERIPHERAL VASCULAR BALLOON ANGIOPLASTY;  Surgeon: Chuck Hint, MD;  Location: [facility] INVASIVE CV LAB;  Service: Cardiovascular;  Laterality: Right;  Posterior tibial    SHOULDER SURGERY Right    X 2   STUMP REVISION Right 01/09/2021   Procedure: REVISION RIGHT BELOW KNEE AMPUTATION;  Surgeon: Nadara Mustard, MD;  Location: [facility] OR;  Service: Orthopedics;  Laterality: Right;    TONSILLECTOMY  ~ 1948   TOTAL HIP REVISION Right 04/13/2019   Procedure: RIGHT TOTAL HIP REVISION-POSTERIOR  APPROACH LATERAL;  Surgeon: Eldred Manges, MD;  Location: [facility] OR;  Service: Orthopedics;  Laterality: Right;   TOTAL KNEE ARTHROPLASTY Left 07/23/2016   Procedure: LEFT TOTAL KNEE ARTHROPLASTY;  Surgeon: Eldred Manges, MD;  Location: [facility] OR;  Service: Orthopedics;  Laterality: Left;     reports that he has quit smoking. His smokeless tobacco use includes chew. He reports that he does not drink alcohol and does not use drugs.  Allergies  Allergen Reactions   Simvastatin Other (See Comments)    SEVERE MYALGIAS    Zetia [Ezetimibe] Other (See Comments)    MYALGIAS   Dilaudid [Hydromorphone Hcl] Other (See Comments)    hallucination    Family History  Problem Relation Age of Onset   Diabetes Mother    Hyperlipidemia Mother  Hypertension Mother    Cancer Father    Hypertension Father    Diabetes Sister    Hypertension Sister    Cancer Brother    Heart attack Neg Hx     Prior to Admission medications   Medication Sig Start Date End Date Taking? Authorizing Provider  acetaminophen (TYLENOL) 500 MG tablet Take 1,000 mg by mouth every 6 (six) hours as needed (pain).    [provider]  allopurinol (ZYLOPRIM) 300 MG tablet Take 300 mg by mouth daily.    [provider]  amLODipine (NORVASC) 2.5 MG tablet Take 1 tablet (2.5 mg total) by mouth daily. Patient not taking: Reported on 11/11/2022 04/15/22   Corky Crafts, MD  aspirin EC 81 MG tablet Take 81 mg by mouth daily.    [provider]  Cholecalciferol (VITAMIN D3) 50 MCG (2000 UT) TABS Take 2,000 Units by mouth daily.     [provider]  clopidogrel (PLAVIX) 75 MG tablet Take 1 tablet (75 mg total) by mouth daily. 12/26/20   Love, Evlyn Kanner, PA-C  Continuous Blood Gluc Sensor (DEXCOM G7 SENSOR) MISC 1 Device by Does not apply route as directed. 09/20/22   [name], Konrad Dolores, MD   Cyanocobalamin (B-12) 2500 MCG TABS Take 2,500 mcg by mouth daily.    [provider]  empagliflozin (JARDIANCE) 25 MG TABS tablet TAKE 1 TABLET(25 MG) BY MOUTH DAILY 02/22/23   [name], Konrad Dolores, MD  gabapentin (NEURONTIN) 100 MG capsule Take 2 capsules (200 mg total) by mouth at bedtime. Patient taking differently: Take 600 mg by mouth at bedtime. 12/26/20   Love, Evlyn Kanner, PA-C  gabapentin (NEURONTIN) 400 MG capsule Take 1 capsule (400 mg total) by mouth 3 (three) times daily. Patient taking differently: Take 400 mg by mouth daily. 12/26/20   Love, Evlyn Kanner, PA-C  hydrochlorothiazide (HYDRODIURIL) 12.5 MG tablet Take 1 tablet (12.5 mg total) by mouth daily. 12/26/20   Love, Evlyn Kanner, PA-C  insulin aspart (NOVOLOG FLEXPEN) 100 UNIT/ML FlexPen Max daily 30 units 06/23/22   [name], Konrad Dolores, MD  insulin degludec (TRESIBA FLEXTOUCH) 100 UNIT/ML FlexTouch Pen Inject 12 Units into the skin daily. 11/11/22   [name], Konrad Dolores, MD  Insulin Pen Needle 32G X 4 MM MISC 1 Device by Does not apply route in the morning, at noon, in the evening, and at bedtime. 06/23/22   [name], Konrad Dolores, MD  isosorbide mononitrate (IMDUR) 30 MG 24 hr tablet Take 1 tablet (30 mg total) by mouth daily. Please schedule yearly appointment for future refills. Thank you 12/26/20   Love, Evlyn Kanner, PA-C  losartan (COZAAR) 50 MG tablet Take 1 tablet (50 mg total) by mouth daily. Patient taking differently: Take 25 mg by mouth daily. 12/26/20   Love, Evlyn Kanner, PA-C  metFORMIN (GLUCOPHAGE) 1000 MG tablet Take 1 tablet (1,000 mg total) by mouth 2 (two) times daily. 02/12/22   [name], Konrad Dolores, MD  nitroGLYCERIN (NITROSTAT) 0.4 MG SL tablet Place 0.4 mg under the tongue every 5 (five) minutes x 3 doses as needed for chest pain.     [provider]  ONE TOUCH ULTRA TEST test strip CHECK BLOOD SUGAR ONCE DAILY AS DIRECTED 09/23/17   [provider]  pioglitazone (ACTOS)  30 MG tablet TAKE 1 TABLET(30 MG) BY MOUTH DAILY 04/06/23   [name], Konrad Dolores, MD  polyethylene glycol (MIRALAX) 17 g packet Take 17 g by mouth 2 (two) times daily. 02/03/21   Vassie Loll, MD  potassium chloride SA (KLOR-CON) 20 MEQ tablet Take 1 tablet (20 mEq total) by mouth daily. 02/03/21   Vassie Loll, MD  senna-docusate (SENOKOT-S) 8.6-50 MG tablet Take 1 tablet by mouth at bedtime. 02/03/21   Vassie Loll, MD  terazosin (HYTRIN) 5 MG capsule Take 1 capsule (5 mg total) by mouth at bedtime. Patient taking differently: Take 10 mg by mouth at bedtime. 12/26/20   Jacquelynn Cree, PA-C     Physical Exam: Vitals:   04/23/23 0300 04/23/23 0315 04/23/23 0330 04/23/23 0345  BP: (!) 175/65 (!) 178/63 (!) 169/62 (!) 181/61  Pulse: 60 (!) 57 64 62  Resp: 12 14 15 15   Temp:      SpO2: 97% 98% 97% 97%    Physical Exam Constitutional:      General: He is not in acute distress.    Appearance: He is not ill-appearing.  Cardiovascular:     Rate and Rhythm: Normal rate.     Heart sounds: Murmur heard.     Systolic murmur is present.  Pulmonary:     Effort: Pulmonary effort is normal.     Breath sounds: Normal breath sounds.  Abdominal:     General: Bowel sounds are normal.     Palpations: Abdomen is soft.  Musculoskeletal:     Left lower leg: No edema.  Skin:    General: Skin is warm.     Capillary Refill: Capillary refill takes less than 2 seconds.  Neurological:     Mental Status: He is alert and oriented to person, place, and time.  Psychiatric:        Mood and Affect: Mood normal.      Labs on Admission: I have personally reviewed following labs and imaging studies  CBC: Recent Labs  Lab 04/22/23 2256  WBC 4.1  HGB 10.7*  HCT 32.2*  MCV 92.8  PLT 114*   Basic Metabolic Panel: Recent Labs  Lab 04/22/23 2256  NA 141  K 3.7  CL 102  CO2 28  GLUCOSE 304*  BUN 28*  CREATININE 1.35*  CALCIUM 10.0   GFR: CrCl cannot be calculated (Unknown ideal  weight.). Liver Function Tests: No results for input(s): "AST", "ALT", "ALKPHOS", "BILITOT", "PROT", "ALBUMIN" in the last 168 hours. No results for input(s): "LIPASE", "AMYLASE" in the last 168 hours. No results for input(s): "AMMONIA" in the last 168 hours. Coagulation Profile: No results for input(s): "INR", "PROTIME" in the last 168 hours. Cardiac Enzymes: Recent Labs  Lab 04/22/23 2256 04/23/23 0056  TROPONINIHS 10 12   BNP (last 3 results) No results for input(s): "BNP" in the last 8760 hours. HbA1C: No results for input(s): "HGBA1C" in the last 72 hours. CBG: Recent Labs  Lab 04/23/23 0505  GLUCAP 150*   Lipid Profile: No results for input(s): "CHOL", "HDL", "LDLCALC", "TRIG", "CHOLHDL", "LDLDIRECT" in the last 72 hours. Thyroid Function Tests: No results for input(s): "TSH", "T4TOTAL", "FREET4", "T3FREE", "THYROIDAB" in the last 72 hours. Anemia Panel: No results for input(s): "VITAMINB12", "FOLATE", "FERRITIN", "TIBC", "IRON", "RETICCTPCT" in the last 72 hours. Urine analysis:    Component Value Date/Time   COLORURINE COLORLESS (A) 04/23/2023 0056   APPEARANCEUR CLEAR 04/23/2023 0056   LABSPEC 1.023 04/23/2023 0056   PHURINE 6.5 04/23/2023 0056   GLUCOSEU >1,000 (A) 04/23/2023 0056   HGBUR NEGATIVE 04/23/2023 0056   BILIRUBINUR NEGATIVE 04/23/2023 0056   KETONESUR NEGATIVE 04/23/2023 0056   PROTEINUR NEGATIVE 04/23/2023 0056   NITRITE NEGATIVE 04/23/2023 0056   LEUKOCYTESUR NEGATIVE 04/23/2023  1610    Radiological Exams on Admission: I have personally reviewed images DG Chest Port 1 View  Result Date: 04/22/2023 CLINICAL DATA:  Chest pain. EXAM: PORTABLE CHEST 1 VIEW COMPARISON:  01/30/2021. FINDINGS: The heart size and mediastinal contours are within normal limits. There is atherosclerotic calcification of the aorta. No consolidation, effusion, or pneumothorax. No acute osseous abnormality. Surgical clips are present in the right upper quadrant. IMPRESSION:  No active disease. Electronically Signed   By: Thornell Sartorius M.D.   On: 04/22/2023 23:24    EKG: My personal interpretation of EKG shows: Sinus tachycardia and right bundle branch block.  No ST and T wave abnormality.    Assessment/Plan: Principal Problem:   Chest pain Active Problems:   Unstable angina (HCC)   History of CAD (coronary artery disease)   Essential hypertension   Type 2 diabetes mellitus with complication, without long-term current use of insulin (HCC)   Hyperlipidemia   Gout   AKI (acute kidney injury) (HCC)   Assessment and Plan: Unstable angina History of CAD heart cath x 6 History of CAD status post post DES stent (currently on aspirin and Plavix) - Patient coming with nonexertional chest pain subsided with nitroglycerin x 3.  Per chart review cardiologist Dr. Eldridge Dace note from 04/15/2022 recommended aggressive secondary prevention. -Per chart review previous previous nuclear medicine stress stress from 04/06/2014 showed fixed defect in the inferolateral wall with associated wall motion abnormality and EF 57%. - Troponin x 2 negative and EKG showed sinus tachycardia without any ST anterior abnormality and right bundle branch block.  Ruled out ACS. - Given patient has extensive cardiac coronary disease history cardiologist has been consulted by EDP and patient was transferred over here for further workup. - Consulted cardiology on-call fellow Dr. Aron Baba. -Continue to monitor and trend troponin. - Continue home aspirin 81 mg daily and Plavix 75 mg daily -Checking A1c and lipid panel.  Patient reported allergic to statin in the past. - Continue sublingual nitroglycerin as needed and morphine 2 mg as needed. - Obtaining nuclear medicine stress test. -Will follow-up with cardiology plan. -Keep patient NPO. -Continue cardiac monitoring for development of arrhythmia  Essential hypertension Elevated blood pressure - Patient reported labile blood pressure at home.   Currently not taking amlodipine.  At home he is on hydrochlorothiazide 12.5 mg daily, losartan  50 mg daily and Imdur 25 mg daily. -Blood pressure trended up to 181/61. -Given patient has AKI holding hydrochlorothiazide losartan and starting amlodipine 2.5 mg and Imdur 25 mg daily. -Will continue to monitor blood pressure and adjust blood pressure regimen as per requirement  AKI on CKD stage IIIa - Creatinine 1.35 on presentation.  Baseline GFR 49-60 - Holding losartan and hydrochlorothiazide in the setting of AKI - Continue gentle hydration with LR 75 cc/h for 1 day. - Monitor urine output -Avoid nephrotoxic agent  PVD status post right-sided BKA - Patient has history of allergic reaction with statin in the past.  Checking LDL panel today but not starting any statin as of now.  Insulin-dependent diabetes mellitus type 2 - Per  chart review most recent A1c 7 in February 2024 - Reported at home he takes [place] 12 units daily, metformin 1000 mg twice daily, Jardiance 25 mg daily and Actos 30 mg daily -Given patient is n.p.o.  holding long-acting insulin, Jardiance and Actos. - In the setting of AKI holding metformin. -Checking A1c  Gout - Resumed home allopurinol 300 mg daily  DVT prophylaxis: SCD.  Holding pharmacological  prophylaxis possible heart cath later today or tomorrow Code Status:  Full Code Diet: Currently n.p.o.  Family Communication: Discussed treatment plan with patient Disposition Plan: Plan to discharge to home in 2 to 3 days Consults: Cardiology Admission status:   Inpatient, Telemetry bed  Severity of Illness: The appropriate patient status for this patient is INPATIENT. Inpatient status is judged to be reasonable and necessary in order to provide the required intensity of service to ensure the patient's safety. The patient's presenting symptoms, physical exam findings, and initial radiographic and laboratory data in the context of their chronic comorbidities is  felt to place them at high risk for further clinical deterioration. Furthermore, it is not anticipated that the patient will be medically stable for discharge from the hospital within 2 midnights of admission.   * I certify that at the point of admission it is my clinical judgment that the patient will require inpatient hospital care spanning beyond 2 midnights from the point of admission due to high intensity of service, high risk for further deterioration and high frequency of surveillance required.Marland Kitchen    Tereasa Coop, MD [facility]  How to contact the [facility] Attending or Consulting provider 7A - 7P or covering provider during after hours 7P -7A, for this patient.  Check the care team in [facility] and look for a) attending/consulting [facility] provider listed and b) the [facility] team listed Log into [email or web address] and use [facility]'s universal password to access. If you do not have the password, please contact the hospital operator. Locate the [facility] provider you are looking for under [facility] and page to a number that you can be directly reached. If you still have difficulty reaching the provider, please page the [facility] (Director on Call) for the Hospitalists listed on amion for assistance.  04/23/2023, 6:19 AM

## 2023-04-23 NOTE — Progress Notes (Signed)
ANTICOAGULATION CONSULT NOTE - Initial Consult  Pharmacy Consult for heparin Indication: ACS/STEMI  Allergies  Allergen Reactions   Simvastatin Other (See Comments)    SEVERE MYALGIAS    Zetia [Ezetimibe] Other (See Comments)    MYALGIAS   Dilaudid [Hydromorphone Hcl] Other (See Comments)    hallucination    Patient Measurements: Height: 5\' 10"  (177.8 cm) Weight: 74 kg (163 lb 2.3 oz) IBW/kg (Calculated) : 73 Heparin Dosing Weight: 74 kg  Vital Signs: Temp: 98 F (36.7 C) (07/20 0842) Temp Source: Oral (07/20 0842) BP: 164/65 (07/20 0842) Pulse Rate: 68 (07/20 0842)  Labs: Recent Labs    04/22/23 2256 04/23/23 0056 04/23/23 0650 04/23/23 0801  HGB 10.7*  --   --   --   HCT 32.2*  --   --   --   PLT 114*  --   --   --   CREATININE 1.35*  --   --   --   TROPONINIHS 10 12 20* 19*    Estimated Creatinine Clearance: 43.6 mL/min (A) (by C-G formula based on SCr of 1.35 mg/dL (H)).   Medical History: Past Medical History:  Diagnosis Date   Allergic rhinitis    Allergic rhinitis    Arthritis    Basal cell carcinoma 11/01/2019    bcc left chest treatment [place] cx3 108fu    Chronic leg pain    right   Chronic lower back pain    Coronary artery disease    a. Stenting to RCA 2004; staged DES to LAD and Cx 2004. DES to [facility] 2012. b. DES to mCx, PTCA to dCx 11/2011. c. Lateral wall MI 2013 s/p PTCA to distal Cx & DES to mid OM2 11/2011. d. Low risk nuc 04/2014, EF wnl.   COVID-19    Diabetes mellitus    Insulin dependent   Diabetic neuropathy (HCC)    MILD   Diverticulosis    Dysrhythmia    [name] syndrome    Gout    right wrist; right foot; right elbow; have had it since 1970's   H/O hiatal hernia    Heart murmur    History of echocardiogram    aortic sclerosis per echo 12/09 EF 65%, otherwise normal   History of hemorrhoids    BLEEDING   History of kidney stones    h/o   Hypertension    Diagnosed 1995    Myocardial infarction [facility])    Pancreatic pseudocyst     a. s/p remote drainage 2006.   Thrombocytopenia (HCC)    Seen on oldest labs in system from 2004   Vitamin B 12 deficiency    orally replaced    Medications:  Scheduled:   allopurinol  300 mg Oral Daily   amLODipine  2.5 mg Oral Daily   aspirin EC  81 mg Oral Daily   clopidogrel  75 mg Oral Daily   isosorbide mononitrate  30 mg Oral Daily   losartan  25 mg Oral Daily   metoprolol succinate  25 mg Oral Daily   sodium chloride flush  3 mL Intravenous Q12H   Infusions:   sodium chloride     lactated ringers 75 mL/hr at 04/23/23 9629    Assessment: Keith Espinoza is a 5 YOM who presented with unstable angina. Patient has a history of CAD s/p DES (on aspirin and plavix). Potential for cardiac catheterization per cardiology MD. Plan to start heparin today.  Goal of Therapy:  Heparin level 0.3-0.7 units/ml Monitor platelets by  anticoagulation protocol: Yes   Plan:  Give 2500 units bolus x 1 Start heparin infusion at 900 units/hr Check heparin level in 8 hours Monitor heparin level and CBC daily; monitor for s/sx of bleeding  Enos Fling, PharmD PGY-1 Acute Care Pharmacy Resident 04/23/2023 12:48 PM

## 2023-04-23 NOTE — Progress Notes (Signed)
ANTICOAGULATION CONSULT NOTE  Pharmacy Consult for heparin Indication: ACS/STEMI  Allergies  Allergen Reactions   Simvastatin Other (See Comments)    SEVERE MYALGIAS    Zetia [Ezetimibe] Other (See Comments)    MYALGIAS   Dilaudid [Hydromorphone Hcl] Other (See Comments)    hallucination    Patient Measurements: Height: 5\' 10"  (177.8 cm) Weight: 74 kg (163 lb 2.3 oz) IBW/kg (Calculated) : 73 Heparin Dosing Weight: 74 kg  Vital Signs: Temp: 98.4 F (36.9 C) (07/20 1934) Temp Source: Oral (07/20 1934) BP: 139/55 (07/20 1934) Pulse Rate: 60 (07/20 1532)  Labs: Recent Labs    04/22/23 2256 04/23/23 0056 04/23/23 0650 04/23/23 0801 04/23/23 2124  HGB 10.7*  --   --   --   --   HCT 32.2*  --   --   --   --   PLT 114*  --   --   --   --   HEPARINUNFRC  --   --   --   --  <0.10*  CREATININE 1.35*  --   --   --   --   TROPONINIHS 10 12 20* 19*  --     Estimated Creatinine Clearance: 43.6 mL/min (A) (by C-G formula based on SCr of 1.35 mg/dL (H)).   Medical History: Past Medical History:  Diagnosis Date   Allergic rhinitis    Allergic rhinitis    Arthritis    Basal cell carcinoma 11/01/2019    bcc left chest treatment [place] cx3 68fu    Chronic leg pain    right   Chronic lower back pain    Coronary artery disease    a. Stenting to RCA 2004; staged DES to LAD and Cx 2004. DES to [facility] 2012. b. DES to mCx, PTCA to dCx 11/2011. c. Lateral wall MI 2013 s/p PTCA to distal Cx & DES to mid OM2 11/2011. d. Low risk nuc 04/2014, EF wnl.   COVID-19    Diabetes mellitus    Insulin dependent   Diabetic neuropathy (HCC)    MILD   Diverticulosis    Dysrhythmia    [name] syndrome    Gout    right wrist; right foot; right elbow; have had it since 1970's   H/O hiatal hernia    Heart murmur    History of echocardiogram    aortic sclerosis per echo 12/09 EF 65%, otherwise normal   History of hemorrhoids    BLEEDING   History of kidney stones    h/o   Hypertension     Diagnosed 1995    Myocardial infarction [facility])    Pancreatic pseudocyst    a. s/p remote drainage 2006.   Thrombocytopenia (HCC)    Seen on oldest labs in system from 2004   Vitamin B 12 deficiency    orally replaced    Medications:  Scheduled:   allopurinol  300 mg Oral Daily   amLODipine  2.5 mg Oral Daily   aspirin EC  81 mg Oral Daily   clopidogrel  75 mg Oral Daily   insulin aspart  0-5 Units Subcutaneous QHS   insulin aspart  0-9 Units Subcutaneous TID WC   isosorbide mononitrate  30 mg Oral Daily   losartan  25 mg Oral Daily   metoprolol succinate  25 mg Oral Daily   rosuvastatin  20 mg Oral Daily   sodium chloride flush  3 mL Intravenous Q12H   Infusions:   sodium chloride     heparin 900  Units/hr (04/23/23 1544)    Assessment: Mr. Matsuo is a 28 YOM who presented with unstable angina. Patient has a history of CAD s/p DES (on aspirin and plavix). Potential for cardiac catheterization per cardiology MD. Plan to start heparin today.  Initial heparin level is undetectable (<0.1), on 900 units/hr. No s/sx of bleeding but IV was lost and heparin was off for at least 1 hour prior to level being drawn.   Goal of Therapy:  Heparin level 0.3-0.7 units/ml Monitor platelets by anticoagulation protocol: Yes   Plan:  Continue heparin infusion at 900 units/hr Check heparin level in 6 hours (will obtain slightly earlier) Monitor heparin level and CBC daily; monitor for s/sx of bleeding  Thank you for allowing pharmacy to participate in this patient's care,  Sherron Monday, PharmD, BCCCP Clinical Pharmacist  Phone: [phone] 04/23/2023 10:41 PM  Please check AMION for all [facility] Pharmacy phone numbers After 10:00 PM, call Main Pharmacy [phone]

## 2023-04-23 NOTE — ED Notes (Addendum)
[facility] at bedside to transport pt to [place] 

## 2023-04-24 ENCOUNTER — Inpatient Hospital Stay (HOSPITAL_COMMUNITY): Payer: Medicare Other

## 2023-04-24 DIAGNOSIS — I200 Unstable angina: Secondary | ICD-10-CM

## 2023-04-24 DIAGNOSIS — I350 Nonrheumatic aortic (valve) stenosis: Secondary | ICD-10-CM

## 2023-04-24 LAB — GLUCOSE, CAPILLARY
Glucose-Capillary: 179 mg/dL — ABNORMAL HIGH (ref 70–99)
Glucose-Capillary: 258 mg/dL — ABNORMAL HIGH (ref 70–99)
Glucose-Capillary: 279 mg/dL — ABNORMAL HIGH (ref 70–99)
Glucose-Capillary: 303 mg/dL — ABNORMAL HIGH (ref 70–99)
Glucose-Capillary: 351 mg/dL — ABNORMAL HIGH (ref 70–99)
Glucose-Capillary: 359 mg/dL — ABNORMAL HIGH (ref 70–99)
Glucose-Capillary: 367 mg/dL — ABNORMAL HIGH (ref 70–99)

## 2023-04-24 LAB — ECHOCARDIOGRAM COMPLETE
AR max vel: 1.1 cm2
AV Area VTI: 1.11 cm2
AV Area mean vel: 1.06 cm2
AV Mean grad: 16 mmHg
AV Peak grad: 27.6 mmHg
Ao pk vel: 2.63 m/s
Area-P 1/2: 2.54 cm2
Height: 70 in
S' Lateral: 2.7 cm
Weight: 2451.2 oz

## 2023-04-24 LAB — CBC
HCT: 30.9 % — ABNORMAL LOW (ref 39.0–52.0)
Hemoglobin: 9.9 g/dL — ABNORMAL LOW (ref 13.0–17.0)
MCH: 29.6 pg (ref 26.0–34.0)
MCHC: 32 g/dL (ref 30.0–36.0)
MCV: 92.5 fL (ref 80.0–100.0)
Platelets: 114 10*3/uL — ABNORMAL LOW (ref 150–400)
RBC: 3.34 MIL/uL — ABNORMAL LOW (ref 4.22–5.81)
RDW: 16.4 % — ABNORMAL HIGH (ref 11.5–15.5)
WBC: 5.3 10*3/uL (ref 4.0–10.5)
nRBC: 0 % (ref 0.0–0.2)

## 2023-04-24 LAB — COMPREHENSIVE METABOLIC PANEL
ALT: 11 U/L (ref 0–44)
AST: 14 U/L — ABNORMAL LOW (ref 15–41)
Albumin: 3.4 g/dL — ABNORMAL LOW (ref 3.5–5.0)
Alkaline Phosphatase: 61 U/L (ref 38–126)
Anion gap: 9 (ref 5–15)
BUN: 29 mg/dL — ABNORMAL HIGH (ref 8–23)
CO2: 26 mmol/L (ref 22–32)
Calcium: 9.3 mg/dL (ref 8.9–10.3)
Chloride: 104 mmol/L (ref 98–111)
Creatinine, Ser: 1.39 mg/dL — ABNORMAL HIGH (ref 0.61–1.24)
GFR, Estimated: 51 mL/min — ABNORMAL LOW (ref >60)
Glucose, Bld: 248 mg/dL — ABNORMAL HIGH (ref 70–99)
Potassium: 3.2 mmol/L — ABNORMAL LOW (ref 3.5–5.1)
Sodium: 139 mmol/L (ref 135–145)
Total Bilirubin: 0.1 mg/dL — ABNORMAL LOW (ref 0.3–1.2)
Total Protein: 6.2 g/dL — ABNORMAL LOW (ref 6.5–8.1)

## 2023-04-24 LAB — HEPARIN LEVEL (UNFRACTIONATED)
Heparin Unfractionated: 0.2 IU/mL — ABNORMAL LOW (ref 0.30–0.70)
Heparin Unfractionated: 0.34 IU/mL (ref 0.30–0.70)
Heparin Unfractionated: 0.4 IU/mL (ref 0.30–0.70)

## 2023-04-24 LAB — APTT: aPTT: 48 seconds — ABNORMAL HIGH (ref 24–36)

## 2023-04-24 LAB — PROTIME-INR
INR: 1.1 (ref 0.8–1.2)
Prothrombin Time: 14.4 seconds (ref 11.4–15.2)

## 2023-04-24 MED ORDER — SODIUM CHLORIDE 0.9 % WEIGHT BASED INFUSION: 1.0000 mL/kg/h | INTRAVENOUS | Status: DC

## 2023-04-24 MED ORDER — POTASSIUM CHLORIDE CRYS ER 20 MEQ PO TBCR
40.0000 meq | EXTENDED_RELEASE_TABLET | Freq: Once | ORAL | Status: AC
  Administered 2023-04-24: 40 meq via ORAL
  Filled 2023-04-24: qty 2

## 2023-04-24 MED ORDER — ASPIRIN 81 MG PO CHEW
81.0000 mg | CHEWABLE_TABLET | ORAL | Status: AC
  Administered 2023-04-25: 81 mg via ORAL
  Filled 2023-04-24: qty 1

## 2023-04-24 MED ORDER — SODIUM CHLORIDE 0.9 % WEIGHT BASED INFUSION
3.0000 mL/kg/h | INTRAVENOUS | Status: DC
  Administered 2023-04-25: 3 mL/kg/h via INTRAVENOUS

## 2023-04-24 NOTE — Progress Notes (Addendum)
PROGRESS NOTE    Keith Espinoza  GNF:[id number] DOB: [birth year] DOA: 04/22/2023 PCP: Emilio Aspen, MD   Brief Narrative:  Keith Espinoza is a 82 y.o. male with medical history significant of CAD s/p stenting RCA 2004 with staged DES-LAD and Cfx 2004, DES RCA 2012, DES mCfx, PTCA dCfx 2013, low risk NST 04/2014, insulin-dependent diabetes mellitus type 2, extensive peripheral vascular disease s/p right BKA and chronic pancreatitis presented to [facility] with complaining of chest pain.  Patient states he took 3 nitro tablets over the previous few hours prior to hospitalization with resolution of chest pain.  Given risk factors patient was admitted to hospital service with consult to cardiology.  Assessment & Plan:   Principal Problem:   Chest pain Active Problems:   Unstable angina (HCC)   History of CAD (coronary artery disease)   Essential hypertension   Type 2 diabetes mellitus with complication, without long-term current use of insulin (HCC)   Hyperlipidemia   Gout   AKI (acute kidney injury) (HCC)  Unstable angina  History of CAD heart cath x 6 History of CAD status post post DES stent (currently on aspirin and Plavix) - Cardiology consulted, appreciate insight recommendations - Without ST changes, troponin minimally elevated, peak at 20 - Consideration for cardiac catheterization Monday 7/22 - Continue aggressive medication control to avoid hypotension - Heparin ongoing - Continue amlodipine, aspirin 81, Plavix, isosorbide, losartan, crestor -Metoprolol held in the setting of bradycardia  Essential hypertension Elevated blood pressure -Patient has labile blood pressures at home -admits to discontinuing amlodipine in the outpatient setting -Continue medications as above, blood pressure currently well-controlled   CKD stage IIIa -AKI ruled out - Creatinine 1.35 on presentation -appears to be near baseline around 1.3 over the past 10 months -  Continue losartan -hold HCTZ in the setting of AKI/cardiac cath  PVD status post right-sided BKA -Reports statin intolerance with simvastatin, discussed improved profile with Crestor but patient refuse dose here -defer to cardiology   Insulin-dependent diabetes mellitus type 2 -A1c 7.1 -Continue to hold home medications in the setting of AKI -Sliding scale insulin initiated now that diet has been initiated  Gout - Continue home allopurinol 300 mg daily   DVT prophylaxis: SCDs Start: 04/23/23 0544 Code Status:   Code Status: Full Code Family Communication: None present  Status is: Inpatient  Dispo: The patient is from: Home              Anticipated d/c is to: To be determined              Anticipated d/c date is: To be determined              Patient currently not medically stable for discharge  Consultants:  Cardiology  Procedures:  Cardiac cath planned 7/22  Antimicrobials:  None  Subjective: No acute issues or events overnight, denies any further episodes of chest pain  Objective: Vitals:   04/23/23 1934 04/23/23 2328 04/24/23 0358 04/24/23 0827  BP: (!) 139/55 127/67 (!) 150/54 (!) 149/64  Pulse:  (!) 52 62 (!) 48  Resp: 16 17 18    Temp: 98.4 F (36.9 C) 98.8 F (37.1 C) 98.5 F (36.9 C)   TempSrc: Oral Oral Oral   SpO2: 96% 97% 99% 97%  Weight:   69.5 kg   Height:        Intake/Output Summary (Last 24 hours) at 04/24/2023 0830 Last data filed at 04/24/2023 0401 Gross per 24 hour  Intake 657.65 ml  Output 1275 ml  Net -617.35 ml   Filed Weights   04/23/23 0550 04/24/23 0358  Weight: 74 kg 69.5 kg    Examination:  General:  Pleasantly resting in bed, No acute distress. HEENT:  Normocephalic atraumatic.  Sclerae nonicteric, noninjected.  Extraocular movements intact bilaterally. Neck:  Without mass or deformity.  Trachea is midline. Lungs:  Clear to auscultate bilaterally without rhonchi, wheeze, or rales. Heart:  Regular rate and rhythm.  Without  murmurs, rubs, or gallops. Abdomen:  Soft, nontender, nondistended.  Without guarding or rebound. Extremities: Right BKA Skin:  Warm and dry, no erythema.    LOS: 1 day   Time spent:  Azucena Fallen, DO [facility]  If 7PM-7AM, please contact night-coverage [email or web address]  04/24/2023, 8:30 AM

## 2023-04-24 NOTE — Progress Notes (Signed)
ANTICOAGULATION CONSULT NOTE- Follow Up  Pharmacy Consult for heparin Indication: ACS/STEMI  Allergies  Allergen Reactions   Simvastatin Other (See Comments)    SEVERE MYALGIAS    Zetia [Ezetimibe] Other (See Comments)    MYALGIAS   Dilaudid [Hydromorphone Hcl] Other (See Comments)    hallucination    Patient Measurements: Height: 5\' 10"  (177.8 cm) Weight: 69.5 kg (153 lb 3.2 oz) IBW/kg (Calculated) : 73 Heparin Dosing Weight: 74 kg  Vital Signs: Temp: 98.5 F (36.9 C) (07/21 0358) Temp Source: Oral (07/21 0358) BP: 149/64 (07/21 0827) Pulse Rate: 48 (07/21 0827)  Labs: Recent Labs    04/22/23 2256 04/23/23 0056 04/23/23 0650 04/23/23 0801 04/23/23 2124 04/24/23 0233 04/24/23 0956  HGB 10.7*  --   --   --   --  9.9*  --   HCT 32.2*  --   --   --   --  30.9*  --   PLT 114*  --   --   --   --  114*  --   APTT  --   --   --   --   --  48*  --   LABPROT  --   --   --   --   --  14.4  --   INR  --   --   --   --   --  1.1  --   HEPARINUNFRC  --   --   --   --  <0.10* 0.20* 0.34  CREATININE 1.35*  --   --   --   --  1.39*  --   TROPONINIHS 10 12 20* 19*  --   --   --     Estimated Creatinine Clearance: 40.3 mL/min (A) (by C-G formula based on SCr of 1.39 mg/dL (H)).   Medical History: Past Medical History:  Diagnosis Date   Allergic rhinitis    Allergic rhinitis    Arthritis    Basal cell carcinoma 11/01/2019    bcc left chest treatment TX cx3 34fu    Chronic leg pain    right   Chronic lower back pain    Coronary artery disease    a. Stenting to RCA 2004; staged DES to LAD and Cx 2004. DES to [facility] 2012. b. DES to mCx, PTCA to dCx 11/2011. c. Lateral wall MI 2013 s/p PTCA to distal Cx & DES to mid OM2 11/2011. d. Low risk nuc 04/2014, EF wnl.   COVID-19    Diabetes mellitus    Insulin dependent   Diabetic neuropathy (HCC)    MILD   Diverticulosis    Dysrhythmia    [name] syndrome    Gout    right wrist; right foot; right elbow; have had it since  1970's   H/O hiatal hernia    Heart murmur    History of echocardiogram    aortic sclerosis per echo 12/09 EF 65%, otherwise normal   History of hemorrhoids    BLEEDING   History of kidney stones    h/o   Hypertension    Diagnosed 1995    Myocardial infarction [facility])    Pancreatic pseudocyst    a. s/p remote drainage 2006.   Thrombocytopenia (HCC)    Seen on oldest labs in system from 2004   Vitamin B 12 deficiency    orally replaced    Medications:  Scheduled:   allopurinol  300 mg Oral Daily   amLODipine  2.5 mg Oral Daily   [  START ON 04/25/2023] aspirin  81 mg Oral Pre-Cath   aspirin EC  81 mg Oral Daily   clopidogrel  75 mg Oral Daily   insulin aspart  0-5 Units Subcutaneous QHS   insulin aspart  0-9 Units Subcutaneous TID WC   isosorbide mononitrate  30 mg Oral Daily   losartan  25 mg Oral Daily   metoprolol succinate  25 mg Oral Daily   rosuvastatin  20 mg Oral Daily   sodium chloride flush  3 mL Intravenous Q12H   Infusions:   sodium chloride     [START ON 04/25/2023] sodium chloride     Followed by   Melene Muller ON 04/25/2023] sodium chloride     heparin 1,100 Units/hr (04/24/23 9811)    Assessment: [name] is a 4 YOM who presented with unstable angina. Patient has a history of CAD s/p DES (on aspirin and plavix). Potential for cardiac catheterization per cardiology MD. Plan to start heparin today.  Initial heparin level is undetectable (<0.1), on 900 units/hr. No s/sx of bleeding but IV was lost and heparin was off for at least 1 hour prior to level being drawn.   7/21 at 1000: Heparin level 0.34 on 1100 units/h (therapeutic). Per RN, no issues with the heparin infusion running. Hgb 10.7>9.9, plts stable.  Goal of Therapy:  Heparin level 0.3-0.7 units/ml Monitor platelets by anticoagulation protocol: Yes   Plan:  Continue heparin infusion to 1100 units/hr Check confirmatory heparin level in 6 hours Monitor heparin level and CBC daily; monitor for s/sx of  bleeding  Thank you for allowing pharmacy to participate in this patient's care,  Enos Fling, PharmD PGY-1 Acute Care Pharmacy Resident 04/24/2023 11:38 AM

## 2023-04-24 NOTE — Progress Notes (Signed)
Pt's K+ was 3.2 this a.m; attempted to notify Imogene Burn, MD. Awaiting further orders.  Bari Edward, RN

## 2023-04-24 NOTE — Progress Notes (Addendum)
ANTICOAGULATION CONSULT NOTE- Follow Up  Pharmacy Consult for heparin Indication: ACS/STEMI  Allergies  Allergen Reactions   Simvastatin Other (See Comments)    SEVERE MYALGIAS    Zetia [Ezetimibe] Other (See Comments)    MYALGIAS   Dilaudid [Hydromorphone Hcl] Other (See Comments)    hallucination    Patient Measurements: Height: 5\' 10"  (177.8 cm) Weight: 74 kg (163 lb 2.3 oz) IBW/kg (Calculated) : 73 Heparin Dosing Weight: 74 kg  Vital Signs: Temp: 98.8 F (37.1 C) (07/20 2328) Temp Source: Oral (07/20 2328) BP: 127/67 (07/20 2328) Pulse Rate: 52 (07/20 2328)  Labs: Recent Labs    04/22/23 2256 04/23/23 0056 04/23/23 0650 04/23/23 0801 04/23/23 2124 04/24/23 0233  HGB 10.7*  --   --   --   --  9.9*  HCT 32.2*  --   --   --   --  30.9*  PLT 114*  --   --   --   --  114*  APTT  --   --   --   --   --  48*  LABPROT  --   --   --   --   --  14.4  INR  --   --   --   --   --  1.1  HEPARINUNFRC  --   --   --   --  <0.10* 0.20*  CREATININE 1.35*  --   --   --   --   --   TROPONINIHS 10 12 20* 19*  --   --     Estimated Creatinine Clearance: 43.6 mL/min (A) (by C-G formula based on SCr of 1.35 mg/dL (H)).   Medical History: Past Medical History:  Diagnosis Date   Allergic rhinitis    Allergic rhinitis    Arthritis    Basal cell carcinoma 11/01/2019    bcc left chest treatment [place] cx3 81fu    Chronic leg pain    right   Chronic lower back pain    Coronary artery disease    a. Stenting to RCA 2004; staged DES to LAD and Cx 2004. DES to [facility] 2012. b. DES to mCx, PTCA to dCx 11/2011. c. Lateral wall MI 2013 s/p PTCA to distal Cx & DES to mid OM2 11/2011. d. Low risk nuc 04/2014, EF wnl.   COVID-19    Diabetes mellitus    Insulin dependent   Diabetic neuropathy (HCC)    MILD   Diverticulosis    Dysrhythmia    [name] syndrome    Gout    right wrist; right foot; right elbow; have had it since 1970's   H/O hiatal hernia    Heart murmur    History of  echocardiogram    aortic sclerosis per echo 12/09 EF 65%, otherwise normal   History of hemorrhoids    BLEEDING   History of kidney stones    h/o   Hypertension    Diagnosed 1995    Myocardial infarction [facility])    Pancreatic pseudocyst    a. s/p remote drainage 2006.   Thrombocytopenia (HCC)    Seen on oldest labs in system from 2004   Vitamin B 12 deficiency    orally replaced    Medications:  Scheduled:   allopurinol  300 mg Oral Daily   amLODipine  2.5 mg Oral Daily   aspirin EC  81 mg Oral Daily   clopidogrel  75 mg Oral Daily   insulin aspart  0-5 Units Subcutaneous  QHS   insulin aspart  0-9 Units Subcutaneous TID WC   isosorbide mononitrate  30 mg Oral Daily   losartan  25 mg Oral Daily   metoprolol succinate  25 mg Oral Daily   rosuvastatin  20 mg Oral Daily   sodium chloride flush  3 mL Intravenous Q12H   Infusions:   sodium chloride     heparin 900 Units/hr (04/23/23 2100)    Assessment: [name] is a 53 YOM who presented with unstable angina. Patient has a history of CAD s/p DES (on aspirin and plavix). Potential for cardiac catheterization per cardiology MD. Plan to start heparin today.  Initial heparin level is undetectable (<0.1), on 900 units/hr. No s/sx of bleeding but IV was lost and heparin was off for at least 1 hour prior to level being drawn.   7/21 AM: Heparin level 0.20 on 900 units/h (subtherapeutic). Per RN, no issues with the heparin infusion running. Hgb 10.7>9.9, plts stable.  Goal of Therapy:  Heparin level 0.3-0.7 units/ml Monitor platelets by anticoagulation protocol: Yes   Plan:  Increase heparin infusion to 1100 units/hr Check heparin level in 6 hours Monitor heparin level and CBC daily; monitor for s/sx of bleeding  Thank you for allowing pharmacy to participate in this patient's care,  Arabella Merles, PharmD. Clinical Pharmacist 04/24/2023 3:56 AM

## 2023-04-24 NOTE — Progress Notes (Signed)
Rounding Note    Patient Name: Keith Espinoza Date of Encounter: 04/24/2023  [facility] Cardiologist: Lance Muss, MD   Subjective   No CP or dyspnea  Inpatient Medications    Scheduled Meds:  allopurinol  300 mg Oral Daily   amLODipine  2.5 mg Oral Daily   aspirin EC  81 mg Oral Daily   clopidogrel  75 mg Oral Daily   insulin aspart  0-5 Units Subcutaneous QHS   insulin aspart  0-9 Units Subcutaneous TID WC   isosorbide mononitrate  30 mg Oral Daily   losartan  25 mg Oral Daily   metoprolol succinate  25 mg Oral Daily   rosuvastatin  20 mg Oral Daily   sodium chloride flush  3 mL Intravenous Q12H   Continuous Infusions:  sodium chloride     heparin 1,100 Units/hr (04/24/23 0637)   PRN Meds: sodium chloride, acetaminophen **OR** acetaminophen, hydrALAZINE, morphine injection, nitroGLYCERIN, ondansetron **OR** ondansetron (ZOFRAN) IV, sodium chloride flush   Vital Signs    Vitals:   04/23/23 1532 04/23/23 1934 04/23/23 2328 04/24/23 0358  BP: (!) 132/49 (!) 139/55 127/67 (!) 150/54  Pulse: 60  (!) 52 62  Resp: 18 16 17 18   Temp: 98.6 F (37 C) 98.4 F (36.9 C) 98.8 F (37.1 C) 98.5 F (36.9 C)  TempSrc: Oral Oral Oral Oral  SpO2: 96% 96% 97% 99%  Weight:    69.5 kg  Height:        Intake/Output Summary (Last 24 hours) at 04/24/2023 0729 Last data filed at 04/24/2023 0401 Gross per 24 hour  Intake 657.65 ml  Output 1275 ml  Net -617.35 ml      04/24/2023    3:58 AM 04/23/2023    5:50 AM 04/01/2023   10:20 AM  Last 3 Weights  Weight (lbs) 153 lb 3.2 oz 163 lb 2.3 oz 161 lb  Weight (kg) 69.491 kg 74 kg 73.029 kg      Telemetry    Sinus bradycardia- Personally Reviewed   Physical Exam   GEN: No acute distress.   Neck: No JVD Cardiac: RRR, no murmurs, rubs, or gallops.  Respiratory: Clear to auscultation bilaterally. GI: Soft, nontender, non-distended  MS: No edema; S/P BKA Neuro:  Nonfocal  Psych: Normal affect   Labs     High Sensitivity Troponin:   Recent Labs  Lab 04/22/23 2256 04/23/23 0056 04/23/23 0650 04/23/23 0801  TROPONINIHS 10 12 20* 19*     Chemistry Recent Labs  Lab 04/22/23 2256 04/24/23 0233  NA 141 139  K 3.7 3.2*  CL 102 104  CO2 28 26  GLUCOSE 304* 248*  BUN 28* 29*  CREATININE 1.35* 1.39*  CALCIUM 10.0 9.3  PROT  --  6.2*  ALBUMIN  --  3.4*  AST  --  14*  ALT  --  11  ALKPHOS  --  61  BILITOT  --  0.1*  GFRNONAA 52* 51*  ANIONGAP 11 9   Hematology Recent Labs  Lab 04/22/23 2256 04/24/23 0233  WBC 4.1 5.3  RBC 3.47* 3.34*  HGB 10.7* 9.9*  HCT 32.2* 30.9*  MCV 92.8 92.5  MCH 30.8 29.6  MCHC 33.2 32.0  RDW 16.4* 16.4*  PLT 114* 114*    Radiology    DG Chest Port 1 View  Result Date: 04/22/2023 CLINICAL DATA:  Chest pain. EXAM: PORTABLE CHEST 1 VIEW COMPARISON:  01/30/2021. FINDINGS: The heart size and mediastinal contours are within normal limits.  There is atherosclerotic calcification of the aorta. No consolidation, effusion, or pneumothorax. No acute osseous abnormality. Surgical clips are present in the right upper quadrant. IMPRESSION: No active disease. Electronically Signed   By: Thornell Sartorius M.D.   On: 04/22/2023 23:24     Patient Profile     CHENEY EWART is a 82 y.o. male with a hx of coronary artery disease, diabetes mellitus, hypertension, peripheral vascular disease, s/p RBKA who is being seen 04/23/2023 for the evaluation of chest pain at the request of Carma Leaven MD.  Patient has a history of coronary artery disease with prior PCI of his right coronary artery, LAD and circumflex in 2004, right coronary artery in 2012, circumflex in 2013. Nuclear study July 2015 showed ejection fraction 57%, inferolateral scar but no ischemia.   Assessment & Plan    Unstable angina-patient's symptoms have both typical and atypical features.  However he has multiple risk factors including diabetes mellitus as well as history of CAD.  Electrocardiogram  shows no ST changes.  Troponin minimally elevated.  1 episode occurred while he was in the bed.  Feel definitive evaluation is warranted.  As outlined previously we will proceed with cardiac catheterization tomorrow.  Continue aspirin, Plavix, Toprol, heparin and statin.   Murmur-aortic stenosis murmur on examination.  Await results of echocardiogram. Coronary artery disease-continue aspirin and Plavix which are home medications.  He did not tolerate Zocor previously.  Crestor initiated to see if he tolerates.  If not would consider PCSK9 inhibitor. Hyperlipidemia-as outlined above he did not tolerate Zocor or Zetia in the past.  Will try Crestor 20 mg daily to see if he tolerates.  If so check lipids and liver in 8 weeks.  If not we will try PCSK9 inhibitor. Hypertension-blood pressure is controlled.  Continue losartan and Toprol.  HCTZ discontinued prior to catheterization. Peripheral vascular disease-add statin as outlined above. Hypokalemia-supplement  For questions or updates, please contact [facility] Please consult [email or web address] for contact info under        Signed, Olga Millers, MD  04/24/2023, 7:29 AM

## 2023-04-24 NOTE — Progress Notes (Signed)
Held metoprolol due to is HR in upper 40s this morning.

## 2023-04-24 NOTE — Progress Notes (Signed)
ANTICOAGULATION CONSULT NOTE- Follow Up  Pharmacy Consult for heparin Indication: ACS/STEMI  Allergies  Allergen Reactions   Simvastatin Other (See Comments)    SEVERE MYALGIAS    Zetia [Ezetimibe] Other (See Comments)    MYALGIAS   Dilaudid [Hydromorphone Hcl] Other (See Comments)    hallucination    Patient Measurements: Height: 5\' 10"  (177.8 cm) Weight: 69.5 kg (153 lb 3.2 oz) IBW/kg (Calculated) : 73 Heparin Dosing Weight: 74 kg  Vital Signs: Temp: 98.4 F (36.9 C) (07/21 1402) Temp Source: Oral (07/21 1402) BP: 127/64 (07/21 1402) Pulse Rate: 44 (07/21 1402)  Labs: Recent Labs    04/22/23 2256 04/23/23 0056 04/23/23 0650 04/23/23 0801 04/23/23 2124 04/24/23 0233 04/24/23 0956 04/24/23 1641  HGB 10.7*  --   --   --   --  9.9*  --   --   HCT 32.2*  --   --   --   --  30.9*  --   --   PLT 114*  --   --   --   --  114*  --   --   APTT  --   --   --   --   --  48*  --   --   LABPROT  --   --   --   --   --  14.4  --   --   INR  --   --   --   --   --  1.1  --   --   HEPARINUNFRC  --   --   --   --    < > 0.20* 0.34 0.40  CREATININE 1.35*  --   --   --   --  1.39*  --   --   TROPONINIHS 10 12 20* 19*  --   --   --   --    < > = values in this interval not displayed.    Estimated Creatinine Clearance: 40.3 mL/min (A) (by C-G formula based on SCr of 1.39 mg/dL (H)).   Medical History: Past Medical History:  Diagnosis Date   Allergic rhinitis    Allergic rhinitis    Arthritis    Basal cell carcinoma 11/01/2019    bcc left chest treatment [place] cx3 9fu    Chronic leg pain    right   Chronic lower back pain    Coronary artery disease    a. Stenting to RCA 2004; staged DES to LAD and Cx 2004. DES to [facility] 2012. b. DES to mCx, PTCA to dCx 11/2011. c. Lateral wall MI 2013 s/p PTCA to distal Cx & DES to mid OM2 11/2011. d. Low risk nuc 04/2014, EF wnl.   COVID-19    Diabetes mellitus    Insulin dependent   Diabetic neuropathy (HCC)    MILD   Diverticulosis     Dysrhythmia    [name] syndrome    Gout    right wrist; right foot; right elbow; have had it since 1970's   H/O hiatal hernia    Heart murmur    History of echocardiogram    aortic sclerosis per echo 12/09 EF 65%, otherwise normal   History of hemorrhoids    BLEEDING   History of kidney stones    h/o   Hypertension    Diagnosed 1995    Myocardial infarction [facility])    Pancreatic pseudocyst    a. s/p remote drainage 2006.   Thrombocytopenia (HCC)  Seen on oldest labs in system from 2004   Vitamin B 12 deficiency    orally replaced    Medications:  Scheduled:   allopurinol  300 mg Oral Daily   amLODipine  2.5 mg Oral Daily   [START ON 04/25/2023] aspirin  81 mg Oral Pre-Cath   aspirin EC  81 mg Oral Daily   clopidogrel  75 mg Oral Daily   insulin aspart  0-5 Units Subcutaneous QHS   insulin aspart  0-9 Units Subcutaneous TID WC   isosorbide mononitrate  30 mg Oral Daily   losartan  25 mg Oral Daily   metoprolol succinate  25 mg Oral Daily   rosuvastatin  20 mg Oral Daily   sodium chloride flush  3 mL Intravenous Q12H   Infusions:   sodium chloride     [START ON 04/25/2023] sodium chloride     Followed by   Melene Muller ON 04/25/2023] sodium chloride     heparin 1,100 Units/hr (04/24/23 1191)    Assessment: Keith Espinoza is a 45 YOM who presented with unstable angina. Patient has a history of CAD s/p DES (on aspirin and plavix). Potential for cardiac catheterization per cardiology MD. Pharmacy consulted to dose IV heparin.  PM: confirmatory heparin level 0.4 on 1100 units/h (therapeutic). No bleeding or issues with heparin infusion documented.  Goal of Therapy:  Heparin level 0.3-0.7 units/ml Monitor platelets by anticoagulation protocol: Yes   Plan:  Continue heparin infusion to 1100 units/hr Monitor heparin level and CBC daily; monitor for s/sx of bleeding  Thank you for allowing pharmacy to participate in this patient's care,  Loralee Pacas, PharmD, BCPS 04/24/2023  5:31 PM  Please check AMION for all [facility] Pharmacy phone numbers After 10:00 PM, call Main Pharmacy [phone]

## 2023-04-24 NOTE — Progress Notes (Signed)
*  PRELIMINARY RESULTS* Echocardiogram 2D Echocardiogram has been performed.  Laddie Aquas 04/24/2023, 4:21 PM

## 2023-04-25 ENCOUNTER — Encounter (HOSPITAL_COMMUNITY)
Admission: EM | Disposition: A | Discharge status: Home / Self Care | Payer: Self-pay | Source: Home / Self Care | Attending: Internal Medicine

## 2023-04-25 DIAGNOSIS — I25110 Atherosclerotic heart disease of native coronary artery with unstable angina pectoris: Secondary | ICD-10-CM

## 2023-04-25 DIAGNOSIS — R7989 Other specified abnormal findings of blood chemistry: Secondary | ICD-10-CM | POA: Diagnosis not present

## 2023-04-25 DIAGNOSIS — R079 Chest pain, unspecified: Secondary | ICD-10-CM | POA: Diagnosis not present

## 2023-04-25 DIAGNOSIS — I2510 Atherosclerotic heart disease of native coronary artery without angina pectoris: Secondary | ICD-10-CM | POA: Diagnosis not present

## 2023-04-25 DIAGNOSIS — I10 Essential (primary) hypertension: Secondary | ICD-10-CM

## 2023-04-25 DIAGNOSIS — E7800 Pure hypercholesterolemia, unspecified: Secondary | ICD-10-CM | POA: Diagnosis not present

## 2023-04-25 DIAGNOSIS — I200 Unstable angina: Secondary | ICD-10-CM | POA: Diagnosis not present

## 2023-04-25 DIAGNOSIS — I2583 Coronary atherosclerosis due to lipid rich plaque: Secondary | ICD-10-CM

## 2023-04-25 HISTORY — PX: LEFT HEART CATH AND CORONARY ANGIOGRAPHY: CATH118249

## 2023-04-25 LAB — BASIC METABOLIC PANEL
Anion gap: 10 (ref 5–15)
BUN: 29 mg/dL — ABNORMAL HIGH (ref 8–23)
CO2: 26 mmol/L (ref 22–32)
Calcium: 9.3 mg/dL (ref 8.9–10.3)
Chloride: 103 mmol/L (ref 98–111)
Creatinine, Ser: 1.4 mg/dL — ABNORMAL HIGH (ref 0.61–1.24)
GFR, Estimated: 50 mL/min — ABNORMAL LOW (ref >60)
Glucose, Bld: 214 mg/dL — ABNORMAL HIGH (ref 70–99)
Potassium: 3.6 mmol/L (ref 3.5–5.1)
Sodium: 139 mmol/L (ref 135–145)

## 2023-04-25 LAB — CBC
HCT: 31 % — ABNORMAL LOW (ref 39.0–52.0)
Hemoglobin: 10 g/dL — ABNORMAL LOW (ref 13.0–17.0)
MCH: 30.5 pg (ref 26.0–34.0)
MCHC: 32.3 g/dL (ref 30.0–36.0)
MCV: 94.5 fL (ref 80.0–100.0)
Platelets: 116 10*3/uL — ABNORMAL LOW (ref 150–400)
RBC: 3.28 MIL/uL — ABNORMAL LOW (ref 4.22–5.81)
RDW: 16.3 % — ABNORMAL HIGH (ref 11.5–15.5)
WBC: 5.7 10*3/uL (ref 4.0–10.5)
nRBC: 0 % (ref 0.0–0.2)

## 2023-04-25 LAB — GLUCOSE, CAPILLARY
Glucose-Capillary: 227 mg/dL — ABNORMAL HIGH (ref 70–99)
Glucose-Capillary: 201 mg/dL — ABNORMAL HIGH (ref 70–99)
Glucose-Capillary: 153 mg/dL — ABNORMAL HIGH (ref 70–99)
Glucose-Capillary: 394 mg/dL — ABNORMAL HIGH (ref 70–99)
Glucose-Capillary: 415 mg/dL — ABNORMAL HIGH (ref 70–99)

## 2023-04-25 LAB — HEPARIN LEVEL (UNFRACTIONATED): Heparin Unfractionated: 0.51 IU/mL (ref 0.30–0.70)

## 2023-04-25 SURGERY — LEFT HEART CATH AND CORONARY ANGIOGRAPHY
Anesthesia: LOCAL

## 2023-04-25 MED ORDER — IOHEXOL 350 MG/ML SOLN
INTRAVENOUS | Status: DC | PRN
  Administered 2023-04-25: 37 mL via INTRA_ARTERIAL

## 2023-04-25 MED ORDER — SODIUM CHLORIDE 0.9% FLUSH: 3.0000 mL | INTRAVENOUS | Status: DC | PRN

## 2023-04-25 MED ORDER — SODIUM CHLORIDE 0.9 % IV SOLN: INTRAVENOUS | Status: AC

## 2023-04-25 MED ORDER — VERAPAMIL HCL 2.5 MG/ML IV SOLN
INTRAVENOUS | Status: AC
  Filled 2023-04-25: qty 2

## 2023-04-25 MED ORDER — MIDAZOLAM HCL 2 MG/2ML IJ SOLN
INTRAMUSCULAR | Status: DC | PRN
  Administered 2023-04-25: 1 mg via INTRAVENOUS

## 2023-04-25 MED ORDER — SODIUM CHLORIDE 0.9 % IV SOLN: 250.0000 mL | INTRAVENOUS | Status: DC | PRN

## 2023-04-25 MED ORDER — FENTANYL CITRATE (PF) 100 MCG/2ML IJ SOLN
INTRAMUSCULAR | Status: AC
  Filled 2023-04-25: qty 2

## 2023-04-25 MED ORDER — SODIUM CHLORIDE 0.9% FLUSH
3.0000 mL | Freq: Two times a day (BID) | INTRAVENOUS | Status: DC
  Administered 2023-04-26 (×2): 3 mL via INTRAVENOUS

## 2023-04-25 MED ORDER — LABETALOL HCL 5 MG/ML IV SOLN: 10.0000 mg | INTRAVENOUS | Status: AC | PRN

## 2023-04-25 MED ORDER — HEPARIN (PORCINE) IN NACL 1000-0.9 UT/500ML-% IV SOLN
INTRAVENOUS | Status: DC | PRN
  Administered 2023-04-25 (×2): 500 mL

## 2023-04-25 MED ORDER — HEPARIN (PORCINE) 25000 UT/250ML-% IV SOLN
1100.0000 [IU]/h | INTRAVENOUS | Status: DC
  Administered 2023-04-26: 1100 [IU]/h via INTRAVENOUS
  Filled 2023-04-25 (×2): qty 250

## 2023-04-25 MED ORDER — MIDAZOLAM HCL 2 MG/2ML IJ SOLN
INTRAMUSCULAR | Status: AC
  Filled 2023-04-25: qty 2

## 2023-04-25 MED ORDER — LIDOCAINE HCL (PF) 1 % IJ SOLN
INTRAMUSCULAR | Status: AC
  Filled 2023-04-25: qty 30

## 2023-04-25 MED ORDER — LIDOCAINE HCL (PF) 1 % IJ SOLN
INTRAMUSCULAR | Status: DC | PRN
  Administered 2023-04-25: 2 mL

## 2023-04-25 MED ORDER — FENTANYL CITRATE (PF) 100 MCG/2ML IJ SOLN
INTRAMUSCULAR | Status: DC | PRN
  Administered 2023-04-25: 25 ug via INTRAVENOUS

## 2023-04-25 MED ORDER — VERAPAMIL HCL 2.5 MG/ML IV SOLN
INTRAVENOUS | Status: DC | PRN
  Administered 2023-04-25: 10 mL via INTRA_ARTERIAL

## 2023-04-25 MED ORDER — HEPARIN SODIUM (PORCINE) 1000 UNIT/ML IJ SOLN
INTRAMUSCULAR | Status: DC | PRN
  Administered 2023-04-25: 3500 [IU] via INTRAVENOUS

## 2023-04-25 MED ORDER — HEPARIN SODIUM (PORCINE) 1000 UNIT/ML IJ SOLN
INTRAMUSCULAR | Status: AC
  Filled 2023-04-25: qty 10

## 2023-04-25 SURGICAL SUPPLY — 12 items
CATH INFINITI AMBI 5FR TG (CATHETERS) IMPLANT
DEVICE RAD COMP TR BAND LRG (VASCULAR PRODUCTS) IMPLANT
GLIDESHEATH SLEND SS 6F .021 (SHEATH) IMPLANT
GUIDEWIRE INQWIRE 1.5J.035X260 (WIRE) IMPLANT
INQWIRE 1.5J .035X260CM (WIRE) ×1
KIT HEART LEFT (KITS) ×1 IMPLANT
KIT SINGLE USE MANIFOLD (KITS) IMPLANT
PACK CARDIAC CATHETERIZATION (CUSTOM PROCEDURE TRAY) ×1 IMPLANT
SET ATX-X65L (MISCELLANEOUS) IMPLANT
SHEATH PROBE COVER 6X72 (BAG) IMPLANT
TRANSDUCER W/STOPCOCK (MISCELLANEOUS) ×1 IMPLANT
TUBING CIL FLEX 10 FLL-RA (TUBING) ×1 IMPLANT

## 2023-04-25 NOTE — Progress Notes (Addendum)
Rounding Note    Patient Name: Keith Espinoza Date of Encounter: 04/25/2023  [facility] Cardiologist: Lance Muss, MD   Subjective   Feeling good today without any complaints of chest pain.  Has not had any since yesterday after receiving nitroglycerin dose.  No shortness of breath.  Inpatient Medications    Scheduled Meds:  allopurinol  300 mg Oral Daily   amLODipine  2.5 mg Oral Daily   aspirin EC  81 mg Oral Daily   clopidogrel  75 mg Oral Daily   insulin aspart  0-5 Units Subcutaneous QHS   insulin aspart  0-9 Units Subcutaneous TID WC   isosorbide mononitrate  30 mg Oral Daily   losartan  25 mg Oral Daily   metoprolol succinate  25 mg Oral Daily   rosuvastatin  20 mg Oral Daily   sodium chloride flush  3 mL Intravenous Q12H   Continuous Infusions:  sodium chloride     sodium chloride     Followed by   sodium chloride     heparin 1,100 Units/hr (04/25/23 0405)   PRN Meds: sodium chloride, acetaminophen **OR** acetaminophen, hydrALAZINE, morphine injection, nitroGLYCERIN, ondansetron **OR** ondansetron (ZOFRAN) IV, sodium chloride flush   Vital Signs    Vitals:   04/24/23 1402 04/24/23 1934 04/24/23 2311 04/25/23 0309  BP: 127/64 (!) 122/57 (!) 147/63 (!) 156/66  Pulse: (!) 44  (!) 53   Resp: 20     Temp: 98.4 F (36.9 C) 98.9 F (37.2 C) 98.6 F (37 C) 98.3 F (36.8 C)  TempSrc: Oral Oral Oral Oral  SpO2: 98%     Weight:    69.4 kg  Height:        Intake/Output Summary (Last 24 hours) at 04/25/2023 0752 Last data filed at 04/25/2023 0708 Gross per 24 hour  Intake 381.78 ml  Output 1900 ml  Net -1518.22 ml      04/25/2023    3:09 AM 04/24/2023    3:58 AM 04/23/2023    5:50 AM  Last 3 Weights  Weight (lbs) 152 lb 14.4 oz 153 lb 3.2 oz 163 lb 2.3 oz  Weight (kg) 69.355 kg 69.491 kg 74 kg      Telemetry    Sinus bradycardia heart rates in the 50s- Personally Reviewed  ECG    No new tracings- Personally Reviewed  Physical  Exam   GEN: No acute distress.   Neck: No JVD Cardiac: +3/6 murmur  Respiratory: Clear to auscultation bilaterally. GI: Soft, nontender, non-distended  MS: No edema; No deformity. Neuro:  Nonfocal  Psych: Normal affect   Labs    High Sensitivity Troponin:   Recent Labs  Lab 04/22/23 2256 04/23/23 0056 04/23/23 0650 04/23/23 0801  TROPONINIHS 10 12 20* 19*     Chemistry Recent Labs  Lab 04/22/23 2256 04/24/23 0233 04/25/23 0441  NA 141 139 139  K 3.7 3.2* 3.6  CL 102 104 103  CO2 28 26 26   GLUCOSE 304* 248* 214*  BUN 28* 29* 29*  CREATININE 1.35* 1.39* 1.40*  CALCIUM 10.0 9.3 9.3  PROT  --  6.2*  --   ALBUMIN  --  3.4*  --   AST  --  14*  --   ALT  --  11  --   ALKPHOS  --  61  --   BILITOT  --  0.1*  --   GFRNONAA 52* 51* 50*  ANIONGAP 11 9 10     Lipids No results  for input(s): "CHOL", "TRIG", "HDL", "LABVLDL", "LDLCALC", "CHOLHDL" in the last 168 hours.  Hematology Recent Labs  Lab 04/22/23 2256 04/24/23 0233 04/25/23 0441  WBC 4.1 5.3 5.7  RBC 3.47* 3.34* 3.28*  HGB 10.7* 9.9* 10.0*  HCT 32.2* 30.9* 31.0*  MCV 92.8 92.5 94.5  MCH 30.8 29.6 30.5  MCHC 33.2 32.0 32.3  RDW 16.4* 16.4* 16.3*  PLT 114* 114* 116*   Thyroid No results for input(s): "TSH", "FREET4" in the last 168 hours.  BNPNo results for input(s): "BNP", "PROBNP" in the last 168 hours.  DDimer No results for input(s): "DDIMER" in the last 168 hours.   Radiology    ECHOCARDIOGRAM COMPLETE  Result Date: 04/24/2023    ECHOCARDIOGRAM REPORT   Patient Name:   Keith Espinoza Date of Exam: 04/24/2023 Medical Rec #:  [id number]      Height:       70.0 in Accession #:    [id number]     Weight:       153.2 lb Date of Birth:  02/24/1941      BSA:          1.864 m Patient Age:    81 years       BP:           127/64 mmHg Patient Gender: M              HR:           54 bpm. Exam Location:  Inpatient Procedure: 2D Echo, Cardiac Doppler and Color Doppler Indications:    Aortic stenosis I35.0  History:         Patient has no prior history of Echocardiogram examinations.                 CAD, Aortic Valve Disease; Risk Factors:Hypertension, Diabetes,                 Dyslipidemia and Former Smoker.  Sonographer:    Dondra Prader RVT RCS Referring Phys: [id number] BRIAN S CRENSHAW IMPRESSIONS  1. Left ventricular ejection fraction, by estimation, is 60 to 65%. The left ventricle has normal function. The left ventricle has no regional wall motion abnormalities. There is mild left ventricular hypertrophy. Left ventricular diastolic parameters are consistent with Grade I diastolic dysfunction (impaired relaxation). Elevated left atrial pressure.  2. Right ventricular systolic function is normal. The right ventricular size is normal.  3. Left atrial size was mildly dilated.  4. Trivial mitral valve regurgitation.  5. AV is thickened, calcified ? functionally bicuspid with fusion of L and noncoronary cusps. Peak and mean gradients through the valve are 26 and 15 mm Hg respectively. AVA (VTI ) is 1.16 cm2. Dimensionless index is 0.44. Overall ocnsistent with mild to moderate AS. Marland Kitchen Aortic valve regurgitation is not visualized. FINDINGS  Left Ventricle: Left ventricular ejection fraction, by estimation, is 60 to 65%. The left ventricle has normal function. The left ventricle has no regional wall motion abnormalities. The left ventricular internal cavity size was normal in size. There is  mild left ventricular hypertrophy. Left ventricular diastolic parameters are consistent with Grade I diastolic dysfunction (impaired relaxation). Elevated left atrial pressure. Right Ventricle: The right ventricular size is normal. Right vetricular wall thickness was not well visualized. Right ventricular systolic function is normal. Left Atrium: Left atrial size was mildly dilated. Right Atrium: Right atrial size was normal in size. Pericardium: There is no evidence of pericardial effusion. Mitral Valve: There is mild thickening of the mitral  valve  leaflet(s). Trivial mitral valve regurgitation. Tricuspid Valve: The tricuspid valve is normal in structure. Tricuspid valve regurgitation is trivial. Aortic Valve: AV is thickened, calcified ? functionally bicuspid with fusion of L and noncoronary cusps. Peak and mean gradients through the valve are 26 and 15 mm Hg respectively. AVA (VTI ) is 1.16 cm2. Dimensionless index is 0.44. Overall ocnsistent with mild to moderate AS. Aortic valve regurgitation is not visualized. Aortic valve mean gradient measures 16.0 mmHg. Aortic valve peak gradient measures 27.6 mmHg. Aortic valve area, by VTI measures 1.11 cm. Pulmonic Valve: The pulmonic valve was grossly normal. Pulmonic valve regurgitation is not visualized. Aorta: The aortic root and ascending aorta are structurally normal, with no evidence of dilitation. IAS/Shunts: No atrial level shunt detected by color flow Doppler.  LEFT VENTRICLE PLAX 2D LVIDd:         4.40 cm   Diastology LVIDs:         2.70 cm   LV e' medial:    4.65 cm/s LV PW:         1.30 cm   LV E/e' medial:  22.2 LV IVS:        1.10 cm   LV e' lateral:   7.43 cm/s LVOT diam:     1.80 cm   LV E/e' lateral: 13.9 LV SV:         75 LV SV Index:   40 LVOT Area:     2.54 cm  RIGHT VENTRICLE             IVC RV Basal diam:  3.30 cm     IVC diam: 2.00 cm RV S prime:     12.40 cm/s TAPSE (M-mode): 2.3 cm LEFT ATRIUM             Index        RIGHT ATRIUM           Index LA diam:        3.70 cm 1.99 cm/m   RA Area:     14.90 cm LA Vol (A2C):   77.0 ml 41.31 ml/m  RA Volume:   35.10 ml  18.83 ml/m LA Vol (A4C):   60.7 ml 32.57 ml/m LA Biplane Vol: 73.7 ml 39.54 ml/m  AORTIC VALVE                     PULMONIC VALVE AV Area (Vmax):    1.10 cm      PV Vmax:       0.95 m/s AV Area (Vmean):   1.06 cm      PV Peak grad:  3.6 mmHg AV Area (VTI):     1.11 cm AV Vmax:           262.50 cm/s AV Vmean:          190.500 cm/s AV VTI:            0.670 m AV Peak Grad:      27.6 mmHg AV Mean Grad:      16.0 mmHg LVOT  Vmax:         113.00 cm/s LVOT Vmean:        79.700 cm/s LVOT VTI:          0.293 m LVOT/AV VTI ratio: 0.44  AORTA Ao Root diam: 2.90 cm Ao Asc diam:  3.60 cm MITRAL VALVE                TRICUSPID VALVE MV  Area (PHT): 2.54 cm     TR Peak grad:   14.9 mmHg MV Decel Time: 299 msec     TR Vmax:        193.00 cm/s MV E velocity: 103.00 cm/s MV A velocity: 125.00 cm/s  SHUNTS MV E/A ratio:  0.82         Systemic VTI:  0.29 m                             Systemic Diam: 1.80 cm Dietrich Pates MD Electronically signed by Dietrich Pates MD Signature Date/Time: 04/24/2023/4:41:23 PM    Final     Cardiac Studies   Echocardiogram 04/24/2023  1. Left ventricular ejection fraction, by estimation, is 60 to 65%. The  left ventricle has normal function. The left ventricle has no regional  wall motion abnormalities. There is mild left ventricular hypertrophy.  Left ventricular diastolic parameters  are consistent with Grade I diastolic dysfunction (impaired relaxation).  Elevated left atrial pressure.   2. Right ventricular systolic function is normal. The right ventricular  size is normal.   3. Left atrial size was mildly dilated.   4. Trivial mitral valve regurgitation.   5. AV is thickened, calcified ? functionally bicuspid with fusion of L  and noncoronary cusps. Peak and mean gradients through the valve are 26  and 15 mm Hg respectively. AVA (VTI ) is 1.16 cm2. Dimensionless index is  0.44. Overall ocnsistent with mild  to moderate AS. Marland Kitchen Aortic valve regurgitation is not visualized.    Patient Profile     82 y.o. male oronary artery disease DES to LAD, RCA, left circumflex in 2004, RCA 2012, circumflex 2013, diabetes mellitus, hypertension, peripheral vascular disease, s/p RBKA.  Currently being evaluated for chest pain with plans for cardiac catheterization.  Assessment & Plan    Unstable angina CAD Initially presented with chest pain lasting 5 to 7 minutes resolved by nitroglycerin with subsequent episodes.   NO respiratory complaints or decreased exercise tolerance. EKG shows no ischemic changes.  Troponins minimally elevated and flat 20-19.  Reports 100% compliancy with DAPT.  Given previous history and multiple risk factors we will plan for cardiac catheterization this morning. Continue aspirin and Plavix, Imdur 30 mg, Toprol XL 25 mg, rosuvastatin 20mg .  On IV heparin Echocardiogram was normal with EF of 60 to 65% with no regional wall motion abnormalities. No BB due to sinus brady.   Hyperlipidemia Previously reported intolerance to simvastatin and Zetia.  Currently trying rosuvastatin 20 mg daily.  If intolerant consider PCSK9 inhibitor.  Recheck lipids in 8 weeks.  Hypertension Given contrast for upcoming procedure we will hold losartan today. Resume after cath if renal function stable. Continue Toprol 25mg  /amlodipine 2.5mg  as above.  HCTZ discontinued prior to catheterization.  PAD Continue statin.  AKI Will continue to monitor.  Previous labs in February of this year show normal values.  Creatinine 1.4.  Mild to moderate AS Mean gradient 15.  Continue to monitor.     For questions or updates, please contact [facility] Please consult [email or web address] for contact info under        Signed, Abagail Kitchens, PA-C  04/25/2023, 7:52 AM

## 2023-04-25 NOTE — Progress Notes (Signed)
ANTICOAGULATION CONSULT NOTE- Follow Up  Pharmacy Consult for heparin Indication: ACS/STEMI  Allergies  Allergen Reactions   Simvastatin Other (See Comments)    SEVERE MYALGIAS    Zetia [Ezetimibe] Other (See Comments)    MYALGIAS   Dilaudid [Hydromorphone Hcl] Other (See Comments)    hallucination    Patient Measurements: Height: 5\' 10"  (177.8 cm) Weight: 69.4 kg (152 lb 14.4 oz) IBW/kg (Calculated) : 73 Heparin Dosing Weight: 74 kg  Vital Signs: Temp: 98.1 F (36.7 C) (07/22 1356) Temp Source: Oral (07/22 1356) BP: 140/53 (07/22 1356) Pulse Rate: 49 (07/22 1356)  Labs: Recent Labs    04/22/23 2256 04/23/23 0056 04/23/23 0650 04/23/23 0801 04/23/23 2124 04/24/23 0233 04/24/23 0956 04/24/23 1641 04/25/23 0441  HGB 10.7*  --   --   --   --  9.9*  --   --  10.0*  HCT 32.2*  --   --   --   --  30.9*  --   --  31.0*  PLT 114*  --   --   --   --  114*  --   --  116*  APTT  --   --   --   --   --  48*  --   --   --   LABPROT  --   --   --   --   --  14.4  --   --   --   INR  --   --   --   --   --  1.1  --   --   --   HEPARINUNFRC  --   --   --   --    < > 0.20* 0.34 0.40 0.51  CREATININE 1.35*  --   --   --   --  1.39*  --   --  1.40*  TROPONINIHS 10 12 20* 19*  --   --   --   --   --    < > = values in this interval not displayed.    Estimated Creatinine Clearance: 39.9 mL/min (A) (by C-G formula based on SCr of 1.4 mg/dL (H)).   Medical History: Past Medical History:  Diagnosis Date   Allergic rhinitis    Allergic rhinitis    Arthritis    Basal cell carcinoma 11/01/2019    bcc left chest treatment [place] cx3 84fu    Chronic leg pain    right   Chronic lower back pain    Coronary artery disease    a. Stenting to RCA 2004; staged DES to LAD and Cx 2004. DES to [facility] 2012. b. DES to mCx, PTCA to dCx 11/2011. c. Lateral wall MI 2013 s/p PTCA to distal Cx & DES to mid OM2 11/2011. d. Low risk nuc 04/2014, EF wnl.   COVID-19    Diabetes mellitus    Insulin  dependent   Diabetic neuropathy (HCC)    MILD   Diverticulosis    Dysrhythmia    [name] syndrome    Gout    right wrist; right foot; right elbow; have had it since 1970's   H/O hiatal hernia    Heart murmur    History of echocardiogram    aortic sclerosis per echo 12/09 EF 65%, otherwise normal   History of hemorrhoids    BLEEDING   History of kidney stones    h/o   Hypertension    Diagnosed 1995    Myocardial infarction (HCC)  Pancreatic pseudocyst    a. s/p remote drainage 2006.   Thrombocytopenia (HCC)    Seen on oldest labs in system from 2004   Vitamin B 12 deficiency    orally replaced    Medications:  Scheduled:   [MAR Hold] allopurinol  300 mg Oral Daily   [MAR Hold] amLODipine  2.5 mg Oral Daily   [MAR Hold] aspirin EC  81 mg Oral Daily   [MAR Hold] clopidogrel  75 mg Oral Daily   [MAR Hold] insulin aspart  0-5 Units Subcutaneous QHS   [MAR Hold] insulin aspart  0-9 Units Subcutaneous TID WC   [MAR Hold] isosorbide mononitrate  30 mg Oral Daily   [MAR Hold] rosuvastatin  20 mg Oral Daily   [MAR Hold] sodium chloride flush  3 mL Intravenous Q12H   Infusions:   [MAR Hold] sodium chloride     sodium chloride 1 mL/kg/hr (04/25/23 1059)   heparin 1,100 Units/hr (04/25/23 0405)    Assessment: Keith Espinoza is a 20 YOM who presented with unstable angina. Patient has a history of CAD s/p DES (on aspirin and plavix). Plans for cardiac catheterization per cardiology. Pharmacy consulted to dose IV heparin.  AM heparin level 0.51 on 1100 units/h (therapeutic). CBC stable  PM: now s/p cath, consult received to resume heparin 2hr s/p TR band removal.  Goal of Therapy:  Heparin level 0.3-0.7 units/ml Monitor platelets by anticoagulation protocol: Yes   Plan:  Resume heparin infusion at 1100 units/hr 2 hr after band removal Check heparin level 8hrs after resuming Daily heparin level and CBC  Loralee Pacas, PharmD, BCPS 04/25/2023 3:45 PM  Please check AMION  for all [facility] Pharmacy phone numbers After 10:00 PM, call Main Pharmacy [phone]

## 2023-04-25 NOTE — Interval H&P Note (Signed)
History and Physical Interval Note:  04/25/2023 2:39 PM  Keith Espinoza  has presented today for surgery, with the diagnosis of CAD w/ unstable angina;  The various methods of treatment have been discussed with the patient and family. After consideration of risks, benefits and other options for treatment, the patient has consented to  Procedure(s): LEFT HEART CATH AND CORONARY ANGIOGRAPHY (N/A) . PERCUTANEOUS CORONARY INTERVENTION   as a surgical intervention.  The patient's history has been reviewed, patient examined, no change in status, stable for surgery.  I have reviewed the patient's chart and labs.  Questions were answered to the patient's satisfaction.    Cath Lab Visit (complete for each Cath Lab visit)  Clinical Evaluation Leading to the Procedure:   ACS: Yes.    Non-ACS:    Anginal Classification: CCS III  Anti-ischemic medical therapy: Maximal Therapy (2 or more classes of medications)  Non-Invasive Test Results: No non-invasive testing performed  Prior CABG: No previous CABG     Bryan Lemma

## 2023-04-25 NOTE — H&P (View-Only) (Signed)
Rounding Note    Patient Name: Keith Espinoza Date of Encounter: 04/25/2023  [facility] Cardiologist: Lance Muss, MD   Subjective   Feeling good today without any complaints of chest pain.  Has not had any since yesterday after receiving nitroglycerin dose.  No shortness of breath.  Inpatient Medications    Scheduled Meds:  allopurinol  300 mg Oral Daily   amLODipine  2.5 mg Oral Daily   aspirin EC  81 mg Oral Daily   clopidogrel  75 mg Oral Daily   insulin aspart  0-5 Units Subcutaneous QHS   insulin aspart  0-9 Units Subcutaneous TID WC   isosorbide mononitrate  30 mg Oral Daily   losartan  25 mg Oral Daily   metoprolol succinate  25 mg Oral Daily   rosuvastatin  20 mg Oral Daily   sodium chloride flush  3 mL Intravenous Q12H   Continuous Infusions:  sodium chloride     sodium chloride     Followed by   sodium chloride     heparin 1,100 Units/hr (04/25/23 0405)   PRN Meds: sodium chloride, acetaminophen **OR** acetaminophen, hydrALAZINE, morphine injection, nitroGLYCERIN, ondansetron **OR** ondansetron (ZOFRAN) IV, sodium chloride flush   Vital Signs    Vitals:   04/24/23 1402 04/24/23 1934 04/24/23 2311 04/25/23 0309  BP: 127/64 (!) 122/57 (!) 147/63 (!) 156/66  Pulse: (!) 44  (!) 53   Resp: 20     Temp: 98.4 F (36.9 C) 98.9 F (37.2 C) 98.6 F (37 C) 98.3 F (36.8 C)  TempSrc: Oral Oral Oral Oral  SpO2: 98%     Weight:    69.4 kg  Height:        Intake/Output Summary (Last 24 hours) at 04/25/2023 0752 Last data filed at 04/25/2023 0708 Gross per 24 hour  Intake 381.78 ml  Output 1900 ml  Net -1518.22 ml      04/25/2023    3:09 AM 04/24/2023    3:58 AM 04/23/2023    5:50 AM  Last 3 Weights  Weight (lbs) 152 lb 14.4 oz 153 lb 3.2 oz 163 lb 2.3 oz  Weight (kg) 69.355 kg 69.491 kg 74 kg      Telemetry    Sinus bradycardia heart rates in the 50s- Personally Reviewed  ECG    No new tracings- Personally Reviewed  Physical  Exam   GEN: No acute distress.   Neck: No JVD Cardiac: +3/6 murmur  Respiratory: Clear to auscultation bilaterally. GI: Soft, nontender, non-distended  MS: No edema; No deformity. Neuro:  Nonfocal  Psych: Normal affect   Labs    High Sensitivity Troponin:   Recent Labs  Lab 04/22/23 2256 04/23/23 0056 04/23/23 0650 04/23/23 0801  TROPONINIHS 10 12 20* 19*     Chemistry Recent Labs  Lab 04/22/23 2256 04/24/23 0233 04/25/23 0441  NA 141 139 139  K 3.7 3.2* 3.6  CL 102 104 103  CO2 28 26 26   GLUCOSE 304* 248* 214*  BUN 28* 29* 29*  CREATININE 1.35* 1.39* 1.40*  CALCIUM 10.0 9.3 9.3  PROT  --  6.2*  --   ALBUMIN  --  3.4*  --   AST  --  14*  --   ALT  --  11  --   ALKPHOS  --  61  --   BILITOT  --  0.1*  --   GFRNONAA 52* 51* 50*  ANIONGAP 11 9 10     Lipids No results  for input(s): "CHOL", "TRIG", "HDL", "LABVLDL", "LDLCALC", "CHOLHDL" in the last 168 hours.  Hematology Recent Labs  Lab 04/22/23 2256 04/24/23 0233 04/25/23 0441  WBC 4.1 5.3 5.7  RBC 3.47* 3.34* 3.28*  HGB 10.7* 9.9* 10.0*  HCT 32.2* 30.9* 31.0*  MCV 92.8 92.5 94.5  MCH 30.8 29.6 30.5  MCHC 33.2 32.0 32.3  RDW 16.4* 16.4* 16.3*  PLT 114* 114* 116*   Thyroid No results for input(s): "TSH", "FREET4" in the last 168 hours.  BNPNo results for input(s): "BNP", "PROBNP" in the last 168 hours.  DDimer No results for input(s): "DDIMER" in the last 168 hours.   Radiology    ECHOCARDIOGRAM COMPLETE  Result Date: 04/24/2023    ECHOCARDIOGRAM REPORT   Patient Name:   CAEDMON LOUQUE Date of Exam: 04/24/2023 Medical Rec #:  [id number]      Height:       70.0 in Accession #:    [id number]     Weight:       153.2 lb Date of Birth:  02/24/1941      BSA:          1.864 m Patient Age:    82 years       BP:           127/64 mmHg Patient Gender: M              HR:           54 bpm. Exam Location:  Inpatient Procedure: 2D Echo, Cardiac Doppler and Color Doppler Indications:    Aortic stenosis I35.0  History:         Patient has no prior history of Echocardiogram examinations.                 CAD, Aortic Valve Disease; Risk Factors:Hypertension, Diabetes,                 Dyslipidemia and Former Smoker.  Sonographer:    Dondra Prader RVT RCS Referring Phys: [id number] BRIAN S CRENSHAW IMPRESSIONS  1. Left ventricular ejection fraction, by estimation, is 60 to 65%. The left ventricle has normal function. The left ventricle has no regional wall motion abnormalities. There is mild left ventricular hypertrophy. Left ventricular diastolic parameters are consistent with Grade I diastolic dysfunction (impaired relaxation). Elevated left atrial pressure.  2. Right ventricular systolic function is normal. The right ventricular size is normal.  3. Left atrial size was mildly dilated.  4. Trivial mitral valve regurgitation.  5. AV is thickened, calcified ? functionally bicuspid with fusion of L and noncoronary cusps. Peak and mean gradients through the valve are 26 and 15 mm Hg respectively. AVA (VTI ) is 1.16 cm2. Dimensionless index is 0.44. Overall ocnsistent with mild to moderate AS. Marland Kitchen Aortic valve regurgitation is not visualized. FINDINGS  Left Ventricle: Left ventricular ejection fraction, by estimation, is 60 to 65%. The left ventricle has normal function. The left ventricle has no regional wall motion abnormalities. The left ventricular internal cavity size was normal in size. There is  mild left ventricular hypertrophy. Left ventricular diastolic parameters are consistent with Grade I diastolic dysfunction (impaired relaxation). Elevated left atrial pressure. Right Ventricle: The right ventricular size is normal. Right vetricular wall thickness was not well visualized. Right ventricular systolic function is normal. Left Atrium: Left atrial size was mildly dilated. Right Atrium: Right atrial size was normal in size. Pericardium: There is no evidence of pericardial effusion. Mitral Valve: There is mild thickening of the mitral  valve  leaflet(s). Trivial mitral valve regurgitation. Tricuspid Valve: The tricuspid valve is normal in structure. Tricuspid valve regurgitation is trivial. Aortic Valve: AV is thickened, calcified ? functionally bicuspid with fusion of L and noncoronary cusps. Peak and mean gradients through the valve are 26 and 15 mm Hg respectively. AVA (VTI ) is 1.16 cm2. Dimensionless index is 0.44. Overall ocnsistent with mild to moderate AS. Aortic valve regurgitation is not visualized. Aortic valve mean gradient measures 16.0 mmHg. Aortic valve peak gradient measures 27.6 mmHg. Aortic valve area, by VTI measures 1.11 cm. Pulmonic Valve: The pulmonic valve was grossly normal. Pulmonic valve regurgitation is not visualized. Aorta: The aortic root and ascending aorta are structurally normal, with no evidence of dilitation. IAS/Shunts: No atrial level shunt detected by color flow Doppler.  LEFT VENTRICLE PLAX 2D LVIDd:         4.40 cm   Diastology LVIDs:         2.70 cm   LV e' medial:    4.65 cm/s LV PW:         1.30 cm   LV E/e' medial:  22.2 LV IVS:        1.10 cm   LV e' lateral:   7.43 cm/s LVOT diam:     1.80 cm   LV E/e' lateral: 13.9 LV SV:         75 LV SV Index:   40 LVOT Area:     2.54 cm  RIGHT VENTRICLE             IVC RV Basal diam:  3.30 cm     IVC diam: 2.00 cm RV S prime:     12.40 cm/s TAPSE (M-mode): 2.3 cm LEFT ATRIUM             Index        RIGHT ATRIUM           Index LA diam:        3.70 cm 1.99 cm/m   RA Area:     14.90 cm LA Vol (A2C):   77.0 ml 41.31 ml/m  RA Volume:   35.10 ml  18.83 ml/m LA Vol (A4C):   60.7 ml 32.57 ml/m LA Biplane Vol: 73.7 ml 39.54 ml/m  AORTIC VALVE                     PULMONIC VALVE AV Area (Vmax):    1.10 cm      PV Vmax:       0.95 m/s AV Area (Vmean):   1.06 cm      PV Peak grad:  3.6 mmHg AV Area (VTI):     1.11 cm AV Vmax:           262.50 cm/s AV Vmean:          190.500 cm/s AV VTI:            0.670 m AV Peak Grad:      27.6 mmHg AV Mean Grad:      16.0 mmHg LVOT  Vmax:         113.00 cm/s LVOT Vmean:        79.700 cm/s LVOT VTI:          0.293 m LVOT/AV VTI ratio: 0.44  AORTA Ao Root diam: 2.90 cm Ao Asc diam:  3.60 cm MITRAL VALVE                TRICUSPID VALVE MV  Area (PHT): 2.54 cm     TR Peak grad:   14.9 mmHg MV Decel Time: 299 msec     TR Vmax:        193.00 cm/s MV E velocity: 103.00 cm/s MV A velocity: 125.00 cm/s  SHUNTS MV E/A ratio:  0.82         Systemic VTI:  0.29 m                             Systemic Diam: 1.80 cm Dietrich Pates MD Electronically signed by Dietrich Pates MD Signature Date/Time: 04/24/2023/4:41:23 PM    Final     Cardiac Studies   Echocardiogram 04/24/2023  1. Left ventricular ejection fraction, by estimation, is 60 to 65%. The  left ventricle has normal function. The left ventricle has no regional  wall motion abnormalities. There is mild left ventricular hypertrophy.  Left ventricular diastolic parameters  are consistent with Grade I diastolic dysfunction (impaired relaxation).  Elevated left atrial pressure.   2. Right ventricular systolic function is normal. The right ventricular  size is normal.   3. Left atrial size was mildly dilated.   4. Trivial mitral valve regurgitation.   5. AV is thickened, calcified ? functionally bicuspid with fusion of L  and noncoronary cusps. Peak and mean gradients through the valve are 26  and 15 mm Hg respectively. AVA (VTI ) is 1.16 cm2. Dimensionless index is  0.44. Overall ocnsistent with mild  to moderate AS. Marland Kitchen Aortic valve regurgitation is not visualized.    Patient Profile     82 y.o. male oronary artery disease DES to LAD, RCA, left circumflex in 2004, RCA 2012, circumflex 2013, diabetes mellitus, hypertension, peripheral vascular disease, s/p RBKA.  Currently being evaluated for chest pain with plans for cardiac catheterization.  Assessment & Plan    Unstable angina CAD Initially presented with chest pain lasting 5 to 7 minutes resolved by nitroglycerin with subsequent episodes.   NO respiratory complaints or decreased exercise tolerance. EKG shows no ischemic changes.  Troponins minimally elevated and flat 20-19.  Reports 100% compliancy with DAPT.  Given previous history and multiple risk factors we will plan for cardiac catheterization this morning. Continue aspirin and Plavix, Imdur 30 mg, Toprol XL 25 mg, rosuvastatin 20mg .  On IV heparin Echocardiogram was normal with EF of 60 to 65% with no regional wall motion abnormalities. No BB due to sinus brady.   Hyperlipidemia Previously reported intolerance to simvastatin and Zetia.  Currently trying rosuvastatin 20 mg daily.  If intolerant consider PCSK9 inhibitor.  Recheck lipids in 8 weeks.  Hypertension Given contrast for upcoming procedure we will hold losartan today. Resume after cath if renal function stable. Continue Toprol 25mg  /amlodipine 2.5mg  as above.  HCTZ discontinued prior to catheterization.  PAD Continue statin.  AKI Will continue to monitor.  Previous labs in February of this year show normal values.  Creatinine 1.4.  Mild to moderate AS Mean gradient 15.  Continue to monitor.     For questions or updates, please contact [facility] Please consult [email or web address] for contact info under        Signed, Abagail Kitchens, PA-C  04/25/2023, 7:52 AM

## 2023-04-25 NOTE — Inpatient Diabetes Management (Addendum)
Inpatient Diabetes Program Recommendations  AACE/ADA: New Consensus Statement on Inpatient Glycemic Control (2015)  Target Ranges:  Prepandial:   less than 140 mg/dL      Peak postprandial:   less than 180 mg/dL (1-2 hours)      Critically ill patients:  140 - 180 mg/dL    Latest Reference Range & Units 04/01/23 10:29  Hemoglobin A1C 4.0 - 5.6 % 7.1 !  !: Data is abnormal  Latest Reference Range & Units 04/24/23 08:11 04/24/23 11:53 04/24/23 16:46 04/24/23 19:38 04/24/23 21:51  Glucose-Capillary 70 - 99 mg/dL 960 (H)  7 units Novolog  179 (H)  2 units Novolog  258 (H)  5 units Novolog 351 (H) 279 (H)  3 units Novolog   (H): Data is abnormally high  Latest Reference Range & Units 04/25/23 07:48  Glucose-Capillary 70 - 99 mg/dL 454 (H)  3 units Novolog   (H): Data is abnormally high     Admit with: CP/ Unstable Angina  History: DM2, CKD  Home DM Meds: Dexcom G7 CGM       Jardiance 25 mg Daily       Novolog 2-5 units Daily per SSI       Tresiba 10 units Daily       Metformin 1000 mg BID          Actos 30 mg Daily  Current Orders: Novolog Sensitive Correction Scale/ SSI (0-9 units) TID AC + HS    NPO for Cardiac Cath today    MD- Note pt takes [place] Insulin at home.  AM CBGs >200 the last 2 days.  Please consider the following:  1. Start Semglee 10 units Daily   2. Start Novolog Meal Coverage after pt resumes PO diet today after Cardiac Cath: Novolog 4 units TID with meals HOLD if pt NPO HOLD if pt eats <50% meals   --Will follow patient during hospitalization--  Ambrose Finland RN, MSN, CDCES Diabetes Coordinator Inpatient Glycemic Control Team Team Pager: [phone] (8a-5p)

## 2023-04-25 NOTE — Progress Notes (Signed)
ANTICOAGULATION CONSULT NOTE- Follow Up  Pharmacy Consult for heparin Indication: ACS/STEMI  Allergies  Allergen Reactions   Simvastatin Other (See Comments)    SEVERE MYALGIAS    Zetia [Ezetimibe] Other (See Comments)    MYALGIAS   Dilaudid [Hydromorphone Hcl] Other (See Comments)    hallucination    Patient Measurements: Height: 5\' 10"  (177.8 cm) Weight: 69.4 kg (152 lb 14.4 oz) IBW/kg (Calculated) : 73 Heparin Dosing Weight: 74 kg  Vital Signs: Temp: 98.3 F (36.8 C) (07/22 0309) Temp Source: Oral (07/22 0309) BP: 156/66 (07/22 0309) Pulse Rate: 53 (07/21 2311)  Labs: Recent Labs    04/22/23 2256 04/23/23 0056 04/23/23 0650 04/23/23 0801 04/23/23 2124 04/24/23 0233 04/24/23 0956 04/24/23 1641 04/25/23 0441  HGB 10.7*  --   --   --   --  9.9*  --   --  10.0*  HCT 32.2*  --   --   --   --  30.9*  --   --  31.0*  PLT 114*  --   --   --   --  114*  --   --  116*  APTT  --   --   --   --   --  48*  --   --   --   LABPROT  --   --   --   --   --  14.4  --   --   --   INR  --   --   --   --   --  1.1  --   --   --   HEPARINUNFRC  --   --   --   --    < > 0.20* 0.34 0.40 0.51  CREATININE 1.35*  --   --   --   --  1.39*  --   --  1.40*  TROPONINIHS 10 12 20* 19*  --   --   --   --   --    < > = values in this interval not displayed.    Estimated Creatinine Clearance: 39.9 mL/min (A) (by C-G formula based on SCr of 1.4 mg/dL (H)).   Medical History: Past Medical History:  Diagnosis Date   Allergic rhinitis    Allergic rhinitis    Arthritis    Basal cell carcinoma 11/01/2019    bcc left chest treatment [place] [id number]    Chronic leg pain    right   Chronic lower back pain    Coronary artery disease    a. Stenting to RCA 2004; staged DES to LAD and Cx 2004. DES to [facility] 2012. b. DES to mCx, PTCA to dCx 11/2011. c. Lateral wall MI 2013 s/p PTCA to distal Cx & DES to mid OM2 11/2011. d. Low risk nuc 04/2014, EF wnl.   COVID-19    Diabetes mellitus    Insulin  dependent   Diabetic neuropathy (HCC)    MILD   Diverticulosis    Dysrhythmia    [name] syndrome    Gout    right wrist; right foot; right elbow; have had it since 1970's   H/O hiatal hernia    Heart murmur    History of echocardiogram    aortic sclerosis per echo 12/09 EF 65%, otherwise normal   History of hemorrhoids    BLEEDING   History of kidney stones    h/o   Hypertension    Diagnosed 1995    Myocardial infarction (HCC)  Pancreatic pseudocyst    a. s/p remote drainage 2006.   Thrombocytopenia (HCC)    Seen on oldest labs in system from 2004   Vitamin B 12 deficiency    orally replaced    Medications:  Scheduled:   allopurinol  300 mg Oral Daily   amLODipine  2.5 mg Oral Daily   aspirin EC  81 mg Oral Daily   clopidogrel  75 mg Oral Daily   insulin aspart  0-5 Units Subcutaneous QHS   insulin aspart  0-9 Units Subcutaneous TID WC   isosorbide mononitrate  30 mg Oral Daily   losartan  25 mg Oral Daily   metoprolol succinate  25 mg Oral Daily   rosuvastatin  20 mg Oral Daily   sodium chloride flush  3 mL Intravenous Q12H   Infusions:   sodium chloride     sodium chloride     Followed by   sodium chloride     heparin 1,100 Units/hr (04/25/23 0405)    Assessment: [name] is a 89 YOM who presented with unstable angina. Patient has a history of CAD s/p DES (on aspirin and plavix). Plans for cardiac catheterization per cardiology. Pharmacy consulted to dose IV heparin.  heparin level 0.51 on 1100 units/h (therapeutic). CBC stable  Goal of Therapy:  Heparin level 0.3-0.7 units/ml Monitor platelets by anticoagulation protocol: Yes   Plan:  Continue heparin infusion to 1100 units/hr Will follow plans post cath  Harland German, PharmD Clinical Pharmacist **Pharmacist phone directory can now be found on [email or web address] ([email or web address]).  Listed under [facility].

## 2023-04-25 NOTE — Progress Notes (Signed)
PROGRESS NOTE    Keith Espinoza  XBM:[id number] DOB: 02/01/1941 DOA: 04/22/2023 PCP: Emilio Aspen, MD   Brief Narrative:  Keith Espinoza is a 82 y.o. male with medical history significant of CAD s/p stenting RCA 2004 with staged DES-LAD and Cfx 2004, DES RCA 2012, DES mCfx, PTCA dCfx 2013, low risk NST 04/2014, insulin-dependent diabetes mellitus type 2, extensive peripheral vascular disease s/p right BKA and chronic pancreatitis presented to [facility] department with complaining of chest pain.  Patient states he took 3 nitro tablets over the previous few hours prior to hospitalization with resolution of chest pain.  Given risk factors patient was admitted to hospital service with consult to cardiology.  Assessment & Plan:   Principal Problem:   Chest pain of uncertain etiology Active Problems:   Unstable angina (HCC)   History of CAD (coronary artery disease)   Primary hypertension   Type 2 diabetes mellitus with complication, without long-term current use of insulin (HCC)   Hyperlipidemia   Gout   AKI (acute kidney injury) (HCC)   Elevated troponin  Unstable angina  History of CAD heart cath x 6 History of CAD status post post DES stent (currently on aspirin and Plavix) - Cardiology consulted, cardiac catheterization 7/22 - "Severe MV CAD - with moderate ISR in LAD/RCA & LCx but each stent with stent edge lesions. Best option is CABG"  - Heparin ongoing - Continue amlodipine, aspirin 81, isosorbide, Crestor -Metoprolol plavix losartan on hold per cards  Essential hypertension Elevated blood pressure -Patient has labile blood pressures at home -admits to discontinuing amlodipine in the outpatient setting -Continue medications as above, blood pressure currently well-controlled   CKD stage IIIa -AKI ruled out - Creatinine 1.35 on presentation -appears to be near baseline around 1.3 over the past 10 months - Continue losartan -hold HCTZ in the setting of  AKI/cardiac cath  PVD status post right-sided BKA -Reports statin intolerance with simvastatin, discussed improved profile with Crestor but patient refuse dose here -defer to cardiology   Insulin-dependent diabetes mellitus type 2 -A1c 7.1 -Continue to hold home medications in the setting of AKI -Sliding scale insulin initiated now that diet has been initiated  Gout - Continue home allopurinol 300 mg daily   DVT prophylaxis: SCDs Start: 04/23/23 0544 Code Status:   Code Status: Full Code Family Communication: None present  Status is: Inpatient  Dispo: The patient is from: Home              Anticipated d/c is to: To be determined              Anticipated d/c date is: To be determined              Patient currently not medically stable for discharge  Consultants:  Cardiology  Procedures:  Cardiac cath planned 7/22  Antimicrobials:  None  Subjective: No acute issues or events overnight, denies any further episodes of chest pain  Objective: Vitals:   04/25/23 0309 04/25/23 0836 04/25/23 1356 04/25/23 1447  BP: (!) 156/66  (!) 140/53   Pulse:  (!) 48 (!) 49   Resp:      Temp: 98.3 F (36.8 C)  98.1 F (36.7 C)   TempSrc: Oral  Oral   SpO2:   97% 96%  Weight: 69.4 kg     Height:        Intake/Output Summary (Last 24 hours) at 04/25/2023 1618 Last data filed at 04/25/2023 0708 Gross per 24 hour  Intake 501.78 ml  Output 1225 ml  Net -723.22 ml   Filed Weights   04/23/23 0550 04/24/23 0358 04/25/23 0309  Weight: 74 kg 69.5 kg 69.4 kg    Examination:  General:  Pleasantly resting in bed, No acute distress. HEENT:  Normocephalic atraumatic.  Sclerae nonicteric, noninjected.  Extraocular movements intact bilaterally. Neck:  Without mass or deformity.  Trachea is midline. Lungs:  Clear to auscultate bilaterally without rhonchi, wheeze, or rales. Heart:  Regular rate and rhythm.  Without murmurs, rubs, or gallops. Abdomen:  Soft, nontender, nondistended.   Without guarding or rebound. Extremities: Right BKA Skin:  Warm and dry, no erythema.    LOS: 2 days   Time spent:  Azucena Fallen, DO [facility]  If 7PM-7AM, please contact night-coverage [email or web address]  04/25/2023, 4:18 PM

## 2023-04-26 ENCOUNTER — Encounter: Payer: Self-pay | Admitting: Internal Medicine

## 2023-04-26 ENCOUNTER — Encounter (HOSPITAL_COMMUNITY): Payer: Self-pay | Admitting: Cardiology

## 2023-04-26 DIAGNOSIS — I739 Peripheral vascular disease, unspecified: Secondary | ICD-10-CM | POA: Diagnosis not present

## 2023-04-26 DIAGNOSIS — E7800 Pure hypercholesterolemia, unspecified: Secondary | ICD-10-CM | POA: Diagnosis not present

## 2023-04-26 DIAGNOSIS — Z95818 Presence of other cardiac implants and grafts: Secondary | ICD-10-CM | POA: Diagnosis not present

## 2023-04-26 DIAGNOSIS — I10 Essential (primary) hypertension: Secondary | ICD-10-CM | POA: Diagnosis not present

## 2023-04-26 DIAGNOSIS — I2510 Atherosclerotic heart disease of native coronary artery without angina pectoris: Secondary | ICD-10-CM | POA: Diagnosis not present

## 2023-04-26 DIAGNOSIS — R079 Chest pain, unspecified: Secondary | ICD-10-CM | POA: Diagnosis not present

## 2023-04-26 LAB — LIPID PANEL
Cholesterol: 154 mg/dL (ref 0–200)
HDL: 57 mg/dL (ref >40)
LDL Cholesterol: 87 mg/dL (ref 0–99)
Total CHOL/HDL Ratio: 2.7 RATIO
Triglycerides: 52 mg/dL (ref <150)
VLDL: 10 mg/dL (ref 0–40)

## 2023-04-26 LAB — GLUCOSE, CAPILLARY
Glucose-Capillary: 321 mg/dL — ABNORMAL HIGH (ref 70–99)
Glucose-Capillary: 312 mg/dL — ABNORMAL HIGH (ref 70–99)
Glucose-Capillary: 259 mg/dL — ABNORMAL HIGH (ref 70–99)
Glucose-Capillary: 172 mg/dL — ABNORMAL HIGH (ref 70–99)
Glucose-Capillary: 215 mg/dL — ABNORMAL HIGH (ref 70–99)

## 2023-04-26 LAB — HEPARIN LEVEL (UNFRACTIONATED): Heparin Unfractionated: 0.36 IU/mL (ref 0.30–0.70)

## 2023-04-26 LAB — CBC
HCT: 30.1 % — ABNORMAL LOW (ref 39.0–52.0)
Hemoglobin: 9.7 g/dL — ABNORMAL LOW (ref 13.0–17.0)
MCH: 29.6 pg (ref 26.0–34.0)
MCHC: 32.2 g/dL (ref 30.0–36.0)
MCV: 91.8 fL (ref 80.0–100.0)
Platelets: 122 10*3/uL — ABNORMAL LOW (ref 150–400)
RBC: 3.28 MIL/uL — ABNORMAL LOW (ref 4.22–5.81)
RDW: 16.6 % — ABNORMAL HIGH (ref 11.5–15.5)
WBC: 5.3 10*3/uL (ref 4.0–10.5)
nRBC: 0 % (ref 0.0–0.2)

## 2023-04-26 LAB — BASIC METABOLIC PANEL
Anion gap: 9 (ref 5–15)
BUN: 25 mg/dL — ABNORMAL HIGH (ref 8–23)
CO2: 24 mmol/L (ref 22–32)
Calcium: 9.3 mg/dL (ref 8.9–10.3)
Chloride: 105 mmol/L (ref 98–111)
Creatinine, Ser: 1.19 mg/dL (ref 0.61–1.24)
GFR, Estimated: >60 mL/min (ref >60)
Glucose, Bld: 217 mg/dL — ABNORMAL HIGH (ref 70–99)
Potassium: 3.2 mmol/L — ABNORMAL LOW (ref 3.5–5.1)
Sodium: 138 mmol/L (ref 135–145)

## 2023-04-26 MED ORDER — AMLODIPINE BESYLATE 5 MG PO TABS: 5.0000 mg | ORAL_TABLET | Freq: Every day | ORAL | Status: DC

## 2023-04-26 MED ORDER — LOSARTAN POTASSIUM 50 MG PO TABS: 50.0000 mg | ORAL_TABLET | Freq: Every day | ORAL | Status: DC

## 2023-04-26 MED ORDER — LOSARTAN POTASSIUM 25 MG PO TABS
25.0000 mg | ORAL_TABLET | Freq: Every day | ORAL | Status: DC
  Administered 2023-04-26 – 2023-04-27 (×2): 25 mg via ORAL
  Filled 2023-04-26 (×2): qty 1

## 2023-04-26 MED ORDER — POTASSIUM CHLORIDE CRYS ER 20 MEQ PO TBCR
60.0000 meq | EXTENDED_RELEASE_TABLET | Freq: Once | ORAL | Status: AC
  Administered 2023-04-26: 60 meq via ORAL
  Filled 2023-04-26: qty 3

## 2023-04-26 MED ORDER — ISOSORBIDE MONONITRATE ER 60 MG PO TB24
60.0000 mg | ORAL_TABLET | Freq: Every day | ORAL | Status: DC
  Administered 2023-04-27: 60 mg via ORAL
  Filled 2023-04-26: qty 1

## 2023-04-26 MED ORDER — AMLODIPINE BESYLATE 5 MG PO TABS
7.5000 mg | ORAL_TABLET | Freq: Every day | ORAL | Status: DC
  Administered 2023-04-26: 7.5 mg via ORAL
  Filled 2023-04-26: qty 1

## 2023-04-26 NOTE — Plan of Care (Signed)

## 2023-04-26 NOTE — Progress Notes (Signed)
PROGRESS NOTE    Keith Espinoza  HYQ:[id number] DOB: 1941-04-14 DOA: 04/22/2023 PCP: Emilio Aspen, MD   Brief Narrative:  Keith Espinoza is a 82 y.o. male with medical history significant of CAD s/p stenting RCA 2004 with staged DES-LAD and Cfx 2004, DES RCA 2012, DES mCfx, PTCA dCfx 2013, low risk NST 04/2014, insulin-dependent diabetes mellitus type 2, extensive peripheral vascular disease s/p right BKA and chronic pancreatitis presented to [facility] with complaining of chest pain.  Patient states he took 3 nitro tablets over the previous few hours prior to hospitalization with resolution of chest pain.  Given risk factors patient was admitted to hospital service with consult to cardiology.  Assessment & Plan:   Principal Problem:   Chest pain of uncertain etiology Active Problems:   Unstable angina (HCC)   History of CAD (coronary artery disease)   Primary hypertension   Type 2 diabetes mellitus with complication, without long-term current use of insulin (HCC)   Hyperlipidemia   Gout   AKI (acute kidney injury) (HCC)   Elevated troponin  Unstable angina  History of CAD heart cath x 6 History of CAD status post post DES stent (currently on aspirin and Plavix) - Cardiology consulted, cardiac catheterization 7/22 - with Severe MV CAD - with moderate in-stent-restenosis in LAD/RCA & LCx - Heparin ongoing - Continue amlodipine, aspirin 81, isosorbide, losartan Crestor - Metoprolol and plavix stopped   Essential hypertension Elevated blood pressure -Patient has labile blood pressures at home -admits to discontinuing medication(s) in the outpatient setting -Continue medications as above, blood pressure currently well-controlled   CKD stage IIIa -AKI ruled out - Creatinine 1.35 on presentation -appears to be near baseline around 1.3 over the past 10 months - Continue losartan -hold HCTZ in the setting of AKI/cardiac cath  PVD status post right-sided  BKA -Reports statin intolerance with simvastatin, discussed improved profile with Crestor but patient refuse dose here -defer to cardiology   Insulin-dependent diabetes mellitus type 2 -A1c 7.1 -Continue to hold home medications in the setting of AKI -Sliding scale insulin initiated now that diet has been initiated  Gout - Continue home allopurinol 300 mg daily   DVT prophylaxis: SCDs Start: 04/23/23 0544 Code Status:   Code Status: Full Code Family Communication: None present  Status is: Inpatient  Dispo: The patient is from: Home              Anticipated d/c is to: To be determined              Anticipated d/c date is: To be determined              Patient currently not medically stable for discharge  Consultants:  Cardiology  Procedures:  Cardiac cath planned 7/22  Antimicrobials:  None  Subjective: No acute issues or events overnight, denies any further episodes of chest pain  Objective: Vitals:   04/25/23 1600 04/25/23 2116 04/26/23 0019 04/26/23 0447  BP: (!) 163/64 (!) 149/60 (!) 156/59 (!) 169/62  Pulse: (!) 54 63 (!) 54 (!) 50  Resp: 16 16 14 16   Temp:  97.9 F (36.6 C) 98.6 F (37 C) 98.5 F (36.9 C)  TempSrc:  Oral Oral Oral  SpO2: 98% 99% 97% 98%  Weight:    71.9 kg  Height:        Intake/Output Summary (Last 24 hours) at 04/26/2023 0827 Last data filed at 04/26/2023 0441 Gross per 24 hour  Intake 58.12 ml  Output  575 ml  Net -516.88 ml   Filed Weights   04/24/23 0358 04/25/23 0309 04/26/23 0447  Weight: 69.5 kg 69.4 kg 71.9 kg    Examination:  General:  Pleasantly resting in bed, No acute distress. HEENT:  Normocephalic atraumatic.  Sclerae nonicteric, noninjected.  Extraocular movements intact bilaterally. Neck:  Without mass or deformity.  Trachea is midline. Lungs:  Clear to auscultate bilaterally without rhonchi, wheeze, or rales. Heart:  Regular rate and rhythm.  Without murmurs, rubs, or gallops. Abdomen:  Soft, nontender,  nondistended.  Without guarding or rebound. Extremities: Right BKA Skin:  Warm and dry, no erythema.    LOS: 3 days   Time spent:  Azucena Fallen, DO [facility]  If 7PM-7AM, please contact night-coverage [email or web address]  04/26/2023, 8:27 AM

## 2023-04-26 NOTE — Care Management Important Message (Signed)
Important Message  Patient Details  Name: Keith Espinoza MRN: [id number] Date of Birth: 1941/06/07   Medicare Important Message Given:  Yes     Renie Ora 04/26/2023, 8:41 AM

## 2023-04-26 NOTE — Inpatient Diabetes Management (Signed)
Inpatient Diabetes Program Recommendations  AACE/ADA: New Consensus Statement on Inpatient Glycemic Control (2015)  Target Ranges:  Prepandial:   less than 140 mg/dL      Peak postprandial:   less than 180 mg/dL (1-2 hours)      Critically ill patients:  140 - 180 mg/dL    Latest Reference Range & Units 04/01/23 10:29  Hemoglobin A1C 4.0 - 5.6 % 7.1 !    Latest Reference Range & Units 04/25/23 07:48 04/25/23 11:26 04/25/23 16:21 04/25/23 21:09 04/25/23 21:11 04/26/23 08:49  Glucose-Capillary 70 - 99 mg/dL 914 (H) 782 (H) 956 (H) 415 (H) 394 (H) 321 (H)   Admit with: CP/ Unstable Angina  History: DM2, CKD  Home DM Meds: Dexcom G7 CGM       Jardiance 25 mg Daily       Novolog 2-5 units Daily per SSI       Tresiba 10 units Daily       Metformin 1000 mg BID          Actos 30 mg Daily  Current Orders: Novolog Sensitive Correction Scale/ SSI (0-9 units) TID AC + HS   MD- Note pt takes [place] Insulin at home.  AM CBGs >200 the last 2 days.  Please consider the following:  1. Start Semglee 10 units Daily   2. Start Novolog Meal Coverage after pt resumes PO diet today after Cardiac Cath: Novolog 4 units TID with meals HOLD if pt NPO HOLD if pt eats <50% meals   --Will follow patient during hospitalization--  Christena Deem RN, MSN, BC-ADM Inpatient Diabetes Coordinator Team Pager [phone] (8a-5p)

## 2023-04-26 NOTE — Progress Notes (Signed)
ANTICOAGULATION CONSULT NOTE- Follow Up  Pharmacy Consult for heparin Indication: ACS/STEMI  Allergies  Allergen Reactions   Simvastatin Other (See Comments)    SEVERE MYALGIAS    Zetia [Ezetimibe] Other (See Comments)    MYALGIAS   Dilaudid [Hydromorphone Hcl] Other (See Comments)    hallucination    Patient Measurements: Height: 5\' 10"  (177.8 cm) Weight: 71.9 kg (158 lb 9.6 oz) IBW/kg (Calculated) : 73 Heparin Dosing Weight: 74 kg  Vital Signs: Temp: 98.5 F (36.9 C) (07/23 0447) Temp Source: Oral (07/23 0447) BP: 169/62 (07/23 0447) Pulse Rate: 50 (07/23 0447)  Labs: Recent Labs    04/24/23 0233 04/24/23 0956 04/24/23 1641 04/25/23 0441 04/26/23 0622  HGB 9.9*  --   --  10.0* 9.7*  HCT 30.9*  --   --  31.0* 30.1*  PLT 114*  --   --  116* 122*  APTT 48*  --   --   --   --   LABPROT 14.4  --   --   --   --   INR 1.1  --   --   --   --   HEPARINUNFRC 0.20*   < > 0.40 0.51 0.36  CREATININE 1.39*  --   --  1.40* 1.19   < > = values in this interval not displayed.    Estimated Creatinine Clearance: 48.7 mL/min (by C-G formula based on SCr of 1.19 mg/dL).   Medical History: Past Medical History:  Diagnosis Date   Allergic rhinitis    Allergic rhinitis    Arthritis    Basal cell carcinoma 11/01/2019    bcc left chest treatment [place] [id number] [id number]    Chronic leg pain    right   Chronic lower back pain    Coronary artery disease    a. Stenting to RCA 2004; staged DES to LAD and Cx 2004. DES to [facility] 2012. b. DES to mCx, PTCA to dCx 11/2011. c. Lateral wall MI 2013 s/p PTCA to distal Cx & DES to mid OM2 11/2011. d. Low risk nuc 04/2014, EF wnl.   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 s/p remote drainage 2006.   Thrombocytopenia (HCC)    Seen on oldest labs in system from 2004   Vitamin B 12 deficiency    orally replaced    Medications:  Scheduled:   allopurinol  300 mg Oral Daily   amLODipine  2.5 mg Oral Daily   aspirin EC  81 mg Oral Daily   insulin aspart  0-5 Units Subcutaneous QHS   insulin aspart  0-9 Units Subcutaneous TID WC   isosorbide mononitrate  30 mg Oral Daily   rosuvastatin  20 mg Oral Daily   sodium chloride flush  3 mL Intravenous Q12H   sodium chloride flush  3 mL Intravenous Q12H  Infusions:   sodium chloride     sodium chloride     heparin 1,100 Units/hr (04/26/23 0331)    Assessment: Mr. Wolfson is a 24 YOM who presented with unstable angina. Patient has a history of CAD s/p DES (on aspirin and plavix). Plans for cardiac catheterization per cardiology. Pharmacy consulted to dose IV heparin.  He is now s/p cath for CABG consult -AM heparin level 0.36 on 1100 units/h (therapeutic). CBC stable    Goal of Therapy:  Heparin level 0.3-0.7 units/ml Monitor platelets by anticoagulation protocol: Yes   Plan:  Continue heparin infusion at 1100 units/hr   Daily heparin level and CBC Will follow plans for CABG  Harland German, PharmD Clinical Pharmacist **Pharmacist phone directory can now be found on [email or web address] ([email or web address]).  Listed under [facility].

## 2023-04-26 NOTE — Consult Note (Addendum)
[address]       [place] [address]             [phone]        Keith Espinoza [facility] Medical Record #[id number] Date of Birth: [birth year]  Referring: Cletus Paris Lemma MD Primary Care: Emilio Aspen, MD Primary Cardiologist:Jayadeep Eldridge Dace, MD  Chief Complaint:    Chief Complaint  Patient presents with   Chest Pain    History of Present Illness:     This is an 82 year old male with a past medical history of coronary artery disease (previus MI, multiple PCI's with stents, last done in 2013),hypertension, diabetes mellitus, peripheral vascular disease (s/p right BKA), gout, Gilbert's disease, hyperlipidemia who presented to [facility] on 04/23/2023 with complaints of chest pain. He was getting out of the shower 07/19 and felt chest pain in left breast area;described pain as dull, it did not radiate and he had no other symptoms. He took a Nitroglycerin with resolution of chest pain. He fell asleep and woke up again with chest pain and took another Nitroglycerin. He called his daughter and on the way to the ED, had chest pain again which Nitroglycerin resolved again. Upon further questioning, he just has felt unwell over the past several weeks.  EKG showed sinus tachycardia, RBBB, inferior infarct. Troponin I (high sensitivity) max was 20. He was put on a Heparin drip. He was transferred to [facility] for further evaluation and treatment.  Echo done 04/24/2023 showed LVEF 60-65%, trivial MR and TR, aortic valve is thickened, ? functionally bicuspid with fusion of L  and noncoronary cusps, peak gradient of AV 27.6 mm Hg consistent with mild to moderate aortic stenosis. Cardiac catheterization done 04/25/2023 showed severe multivessel disease (please see catheterization report below) and no aortic valve stenosis. TCTS has been consulted for consideration of coronary artery bypass grafting surgery.  Patient does live alone and is independent. His  daughter does not live far from him. His wife had a CABG by Dr. Laneta Simmers years ago. His wife died about 2 years ago (she was on hemodialysis). At the time of my exam, he denied chest pain or shortness of breath. He states he is going home today.  Current Activity/ Functional Status: Patient is independent with mobility/ambulation, transfers, ADL's, IADL's.   Zubrod Score: At the time of surgery this patient's most appropriate activity status/level should be described as: []     0    Normal activity, no symptoms [x]     1    Restricted in physical strenuous activity but ambulatory, able to do out light work []     2    Ambulatory and capable of self care, unable to do work activities, up and about more than 50% of the time                            []     3    Only limited self care, in bed greater than 50% of waking hours []     4    Completely disabled, no self care, confined to bed or chair []     5    Moribund  Past Medical History:  Diagnosis Date   Allergic rhinitis    Allergic rhinitis    Arthritis    Basal cell carcinoma 11/01/2019    bcc left chest treatment [place] [id number]    Chronic leg pain  right   Chronic lower back pain    Coronary artery disease    a. Stenting to RCA 2004; staged DES to LAD and Cx 2004. DES to [facility] 2012. b. DES to mCx, PTCA to dCx 11/2011. c. Lateral wall MI 2013 s/p PTCA to distal Cx & DES to mid OM2 11/2011. d. Low risk nuc 04/2014, EF wnl.   COVID-19    Diabetes mellitus    Insulin dependent   Diabetic neuropathy (HCC)    MILD   Diverticulosis    Dysrhythmia    [name] syndrome    Gout    right wrist; right foot; right elbow; have had it since 1970's   H/O hiatal hernia    Heart murmur    History of echocardiogram    aortic sclerosis per echo 12/09 EF 65%, otherwise normal   History of hemorrhoids    BLEEDING   History of kidney stones    h/o   Hypertension    Diagnosed 1995    Myocardial infarction [facility])    Pancreatic pseudocyst    a. s/p remote  drainage 2006.   Thrombocytopenia (HCC)    Seen on oldest labs in system from 2004   Vitamin B 12 deficiency    orally replaced    Past Surgical History:  Procedure Laterality Date   ABDOMINAL AORTOGRAM W/LOWER EXTREMITY Bilateral 08/08/2020   Procedure: ABDOMINAL AORTOGRAM W/LOWER EXTREMITY;  Surgeon: Chuck Hint, MD;  Location: [facility] INVASIVE CV LAB;  Service: Cardiovascular;  Laterality: Bilateral;   AMPUTATION Right 12/12/2020   Procedure: RIGHT BELOW KNEE AMPUTATION;  Surgeon: Nadara Mustard, MD;  Location: [facility] OR;  Service: Orthopedics;  Laterality: Right;   BACK SURGERY     "total of 3 times" S/P fall    CARPAL TUNNEL RELEASE Bilateral    CHOLECYSTECTOMY  1990's   COLONOSCOPY     CORONARY ANGIOPLASTY  11/11/11   CORONARY ANGIOPLASTY WITH STENT PLACEMENT  09/30/2011   "1 then; makes a total of 4"   CORONARY ANGIOPLASTY WITH STENT PLACEMENT  11/11/11   "1; makes a total of 5"   INGUINAL HERNIA REPAIR  2003   right   JOINT REPLACEMENT Right 04/03/2002   hip replacment   KNEE ARTHROSCOPY  1990's   left   LEFT HEART CATH AND CORONARY ANGIOGRAPHY N/A 04/25/2023   Procedure: LEFT HEART CATH AND CORONARY ANGIOGRAPHY;  Surgeon: Marykay Lex, MD;  Location[facility] INVASIVE CV LAB;  Service: Cardiovascular;  Laterality: N/A;   LEFT HEART CATHETERIZATION WITH CORONARY ANGIOGRAM N/A 09/30/2011   Procedure: LEFT HEART CATHETERIZATION WITH CORONARY ANGIOGRAM;  Surgeon: Corky Crafts, MD;  Location: [facility] CATH LAB;  Service: Cardiovascular;  Laterality: N/A;  possible PCI   LEFT HEART CATHETERIZATION WITH CORONARY ANGIOGRAM N/A 11/15/2011   Procedure: LEFT HEART CATHETERIZATION WITH CORONARY ANGIOGRAM;  Surgeon: Corky Crafts, MD;  Location: [facility] CATH LAB;  Service: Cardiovascular;  Laterality: N/A;   PERCUTANEOUS CORONARY STENT INTERVENTION (PCI-S)  09/30/2011   Procedure: PERCUTANEOUS CORONARY STENT INTERVENTION (PCI-S);  Surgeon: Corky Crafts, MD;  Location: [facility] CATH LAB;   Service: Cardiovascular;;   PERCUTANEOUS CORONARY STENT INTERVENTION (PCI-S) N/A 11/11/2011   Procedure: PERCUTANEOUS CORONARY STENT INTERVENTION (PCI-S);  Surgeon: Corky Crafts, MD;  Location: [facility] CATH LAB;  Service: Cardiovascular;  Laterality: N/A;   PERIPHERAL VASCULAR BALLOON ANGIOPLASTY Right 08/08/2020   Procedure: PERIPHERAL VASCULAR BALLOON ANGIOPLASTY;  Surgeon: Chuck Hint, MD;  Location: [facility] INVASIVE CV LAB;  Service: Cardiovascular;  Laterality: Right;  Posterior tibial    SHOULDER SURGERY Right    X 2   STUMP REVISION Right 01/09/2021   Procedure: REVISION RIGHT BELOW KNEE AMPUTATION;  Surgeon: Nadara Mustard, MD;  Location: [facility] OR;  Service: Orthopedics;  Laterality: Right;   TONSILLECTOMY  ~ 1948   TOTAL HIP REVISION Right 04/13/2019   Procedure: RIGHT TOTAL HIP REVISION-POSTERIOR  APPROACH LATERAL;  Surgeon: Eldred Manges, MD;  Location: [facility] OR;  Service: Orthopedics;  Laterality: Right;   TOTAL KNEE ARTHROPLASTY Left 07/23/2016   Procedure: LEFT TOTAL KNEE ARTHROPLASTY;  Surgeon: Eldred Manges, MD;  Location: [facility] OR;  Service: Orthopedics;  Laterality: Left;    Social History   Tobacco Use  Smoking Status Former  Smokeless Tobacco Current   Types: Chew  Tobacco Comments   quit 60 years ago    Social History   Substance and Sexual Activity  Alcohol Use No    Allergies: Allergies  Allergen Reactions   Simvastatin Other (See Comments)    SEVERE MYALGIAS    Zetia [Ezetimibe] Other (See Comments)    MYALGIAS   Dilaudid [Hydromorphone Hcl] Other (See Comments)    hallucination    Current Facility-Administered Medications  Medication Dose Route Frequency Provider Last Rate Last Admin   0.9 %  sodium chloride infusion  250 mL Intravenous PRN Marykay Lex, MD       0.9 %  sodium chloride infusion  250 mL Intravenous PRN Marykay Lex, MD       acetaminophen (TYLENOL) tablet 650 mg  650 mg Oral Q6H PRN Marykay Lex, MD       Or    acetaminophen (TYLENOL) suppository 650 mg  650 mg Rectal Q6H PRN Marykay Lex, MD       allopurinol (ZYLOPRIM) tablet 300 mg  300 mg Oral Daily Marykay Lex, MD   300 mg at 04/25/23 0837   amLODipine (NORVASC) tablet 2.5 mg  2.5 mg Oral Daily Marykay Lex, MD   2.5 mg at 04/25/23 1610   aspirin EC tablet 81 mg  81 mg Oral Daily Marykay Lex, MD   81 mg at 04/24/23 0827   heparin ADULT infusion 100 units/mL (25000 units/262mL)  1,100 Units/hr Intravenous Continuous Rollene Fare, RPH 11 mL/hr at 04/26/23 0331 1,100 Units/hr at 04/26/23 0331   hydrALAZINE (APRESOLINE) injection 10 mg  10 mg Intravenous Q8H PRN Marykay Lex, MD       insulin aspart (novoLOG) injection 0-5 Units  0-5 Units Subcutaneous QHS Marykay Lex, MD   5 Units at 04/25/23 2256   insulin aspart (novoLOG) injection 0-9 Units  0-9 Units Subcutaneous TID WC Marykay Lex, MD   2 Units at 04/25/23 1740   isosorbide mononitrate (IMDUR) 24 hr tablet 30 mg  30 mg Oral Daily Marykay Lex, MD   30 mg at 04/25/23 0836   morphine (PF) 2 MG/ML injection 2 mg  2 mg Intravenous Q4H PRN Marykay Lex, MD       nitroGLYCERIN (NITROSTAT) SL tablet 0.4 mg  0.4 mg Sublingual Q5 min PRN Marykay Lex, MD       ondansetron [facility]) tablet 4 mg  4 mg Oral Q6H PRN Marykay Lex, MD       Or   ondansetron [facility]) injection 4 mg  4 mg Intravenous Q6H PRN Marykay Lex, MD       rosuvastatin (CRESTOR) tablet 20 mg  20 mg Oral Daily  Marykay Lex, MD   20 mg at 04/25/23 9518   sodium chloride flush (NS) 0.9 % injection 3 mL  3 mL Intravenous Q12H Marykay Lex, MD   3 mL at 04/26/23 0022   sodium chloride flush (NS) 0.9 % injection 3 mL  3 mL Intravenous PRN Marykay Lex, MD       sodium chloride flush (NS) 0.9 % injection 3 mL  3 mL Intravenous Q12H Marykay Lex, MD   3 mL at 04/26/23 0023   sodium chloride flush (NS) 0.9 % injection 3 mL  3 mL Intravenous PRN Marykay Lex, MD         Medications Prior to Admission  Medication Sig Dispense Refill Last Dose   acetaminophen (TYLENOL) 500 MG tablet Take 1,000 mg by mouth every 6 (six) hours as needed (pain).   months   allopurinol (ZYLOPRIM) 300 MG tablet Take 300 mg by mouth daily.   04/22/2023   Ascorbic Acid (VITAMIN C PO) Take 1 tablet by mouth at bedtime.   04/22/2023   aspirin EC 81 MG tablet Take 81 mg by mouth daily.   04/22/2023   Cholecalciferol (VITAMIN D3) 50 MCG (2000 UT) TABS Take 2,000 Units by mouth at bedtime.   04/22/2023   clopidogrel (PLAVIX) 75 MG tablet Take 1 tablet (75 mg total) by mouth daily. 30 tablet 0 04/22/2023   Cyanocobalamin (B-12) 2500 MCG TABS Take 2,500 mcg by mouth daily.   04/22/2023   empagliflozin (JARDIANCE) 25 MG TABS tablet TAKE 1 TABLET(25 MG) BY MOUTH DAILY 90 tablet 0 04/22/2023   gabapentin (NEURONTIN) 300 MG capsule Take 600 mg by mouth at bedtime.   04/22/2023   gabapentin (NEURONTIN) 400 MG capsule Take 1 capsule (400 mg total) by mouth 3 (three) times daily. (Patient taking differently: Take 400 mg by mouth daily.) 90 capsule 0 04/22/2023   hydrochlorothiazide (HYDRODIURIL) 12.5 MG tablet Take 1 tablet (12.5 mg total) by mouth daily. 30 tablet 0 04/22/2023   insulin aspart (NOVOLOG FLEXPEN) 100 UNIT/ML FlexPen Max daily 30 units (Patient taking differently: Inject 2-5 Units into the skin daily. Per sliding scale) 30 mL 3 04/21/2023   insulin degludec (TRESIBA FLEXTOUCH) 100 UNIT/ML FlexTouch Pen Inject 12 Units into the skin daily. (Patient taking differently: Inject 10 Units into the skin daily.) 15 mL 3 04/22/2023   isosorbide mononitrate (IMDUR) 30 MG 24 hr tablet Take 1 tablet (30 mg total) by mouth daily. Please schedule yearly appointment for future refills. Thank you 30 tablet 0 04/22/2023   losartan (COZAAR) 50 MG tablet Take 1 tablet (50 mg total) by mouth daily. (Patient taking differently: Take 25 mg by mouth daily.) 30 tablet 0 04/21/2023   MAGNESIUM PO Take 1 tablet by mouth at  bedtime.   04/22/2023   metFORMIN (GLUCOPHAGE) 1000 MG tablet Take 1 tablet (1,000 mg total) by mouth 2 (two) times daily. 180 tablet 3 04/22/2023   nitroGLYCERIN (NITROSTAT) 0.4 MG SL tablet Place 0.4 mg under the tongue every 5 (five) minutes x 3 doses as needed for chest pain.    04/22/2023   pioglitazone (ACTOS) 30 MG tablet TAKE 1 TABLET(30 MG) BY MOUTH DAILY 90 tablet 3 04/22/2023   polyethylene glycol (MIRALAX) 17 g packet Take 17 g by mouth 2 (two) times daily. (Patient taking differently: Take 17 g by mouth daily as needed for mild constipation or moderate constipation.) 28 each 3 Past Month   potassium chloride SA (KLOR-CON) 20 MEQ tablet Take  1 tablet (20 mEq total) by mouth daily. (Patient taking differently: Take 20 mEq by mouth at bedtime.) 20 tablet 0 04/22/2023   senna-docusate (SENOKOT-S) 8.6-50 MG tablet Take 1 tablet by mouth at bedtime. (Patient taking differently: Take 1 tablet by mouth 2 (two) times daily.) 30 tablet 2 04/22/2023   terazosin (HYTRIN) 5 MG capsule Take 1 capsule (5 mg total) by mouth at bedtime. (Patient taking differently: Take 5 mg by mouth 2 (two) times daily.) 30 capsule 0 04/22/2023   Turmeric (QC TUMERIC COMPLEX PO) Take 1 capsule by mouth at bedtime.   04/22/2023   amLODipine (NORVASC) 2.5 MG tablet Take 1 tablet (2.5 mg total) by mouth daily. (Patient not taking: Reported on 11/11/2022) 90 tablet 3 Not Taking   Continuous Blood Gluc Sensor (DEXCOM G7 SENSOR) MISC 1 Device by Does not apply route as directed. 9 each 3    Insulin Pen Needle 32G X 4 MM MISC 1 Device by Does not apply route in the morning, at noon, in the evening, and at bedtime. 400 each 3     Family History  Problem Relation Age of Onset   Diabetes Mother    Hyperlipidemia Mother    Hypertension Mother    Cancer Father    Hypertension Father    Diabetes Sister    Hypertension Sister    Cancer Brother    Heart attack Neg Hx    Review of Systems:     Cardiac Review of Systems: Y or  [    ]=  no  Chest Pain [ prior to admission   ]     Pedal Edema [  N ]    Syncope  [ N ]     General Review of Systems: [Y] = yes [  ]=no Constitional:nausea [ N ]; fever [N  ];  Resp: cough [ N ];    hemoptysis[ N ];  GI:   vomiting[  N];  melena[N  ];    Heme/Lymph: anemia[ Y ];  Neuro: TIA[N  ];   stroke[N  ];  ve];     Endocrine: diabetes[Y  ];         Physical Exam: BP (!) 169/62 (BP Location: Left Arm)   Pulse (!) 50   Temp 98.5 F (36.9 C) (Oral)   Resp 16   Ht 5\' 10"  (1.778 m)   Wt 71.9 kg   SpO2 98%   BMI 22.76 kg/m    General appearance: alert, cooperative, and no distress Head: Normocephalic, without obvious abnormality, atraumatic Neck: no carotid bruit and supple, symmetrical, trachea midline Resp: clear to auscultation bilaterally Cardio: RRR, systolic murmur Grade II/VI heard best along left sternal border GI: Soft, non tender, bowel sounds present Extremities: LLE no edema;prosthesis on right (right BKA) Neurologic: Grossly normal  Diagnostic Studies & Laboratory data:     Recent Radiology Findings:   CARDIAC CATHETERIZATION  Result Date: 04/25/2023   Mid LAD-1 lesion is 75% stenosed with 70% stenosed side branch in 1st Diag. - just prior to stent.  Mid LAD-2 STENT is 40% stenosed.  Mid LAD-3 lesion is 75% stenosed with 60% stenosed side branch in 2nd Diag at distal edge of STENT   1st Diag lesion is 90% stenosed.   Prox Cx to Mid Cx STENT segment is 45% stenosed with 90% stenosed side branch in 4th Mrg.  Mid Cx to Dist Cx lesion is 95% stenosed just after stent   Dist Cx-1 lesion is 95% stenosed prior to  stent. Previously placed Dist Cx-2 DES STENT is widely patent.   Prox RCA-1 lesion is 70% stenosed.  Prox RCA-2 lesion is 85% stenosed. Prox RCA-3 lesion is 80% stenosed. Prox RCA to Mid RCA lesion is 95% stenosed.   Mid RCA STENT is 35% stenosed.   RPAV lesion is 60% stenosed.   RPDA lesion is 80% stenosed.   There is no aortic valve stenosis.  Severe MV CAD - with  moderate ISR in LAD/RCA & LCx but each stent with stent edge lesions.  Best option is CABG -- RECOMMENDATIONS   CVTS Consultation   Recommend Aspirin 81mg  daily for moderate CAD.   D/c Plavix for planned CVTS consult; Have ordered IV Heparin 2 hr post TR Band off   Diagnostic Dominance: Right  Intervention  ECHOCARDIOGRAM COMPLETE  Result Date: 04/24/2023    ECHOCARDIOGRAM REPORT   Patient Name:   JOCK MAHON Date of Exam: 04/24/2023 Medical Rec #:  [id number]      Height:       70.0 in Accession #:    [id number]     Weight:       153.2 lb Date of Birth:  11-09-1940      BSA:          1.864 m Patient Age:    81 years       BP:           127/64 mmHg Patient Gender: M              HR:           54 bpm. Exam Location:  Inpatient Procedure: 2D Echo, Cardiac Doppler and Color Doppler Indications:    Aortic stenosis I35.0  History:        Patient has no prior history of Echocardiogram examinations.                 CAD, Aortic Valve Disease; Risk Factors:Hypertension, Diabetes,                 Dyslipidemia and Former Smoker.  Sonographer:    Dondra Prader RVT RCS Referring Phys: [id number] BRIAN S CRENSHAW IMPRESSIONS  1. Left ventricular ejection fraction, by estimation, is 60 to 65%. The left ventricle has normal function. The left ventricle has no regional wall motion abnormalities. There is mild left ventricular hypertrophy. Left ventricular diastolic parameters are consistent with Grade I diastolic dysfunction (impaired relaxation). Elevated left atrial pressure.  2. Right ventricular systolic function is normal. The right ventricular size is normal.  3. Left atrial size was mildly dilated.  4. Trivial mitral valve regurgitation.  5. AV is thickened, calcified ? functionally bicuspid with fusion of L and noncoronary cusps. Peak and mean gradients through the valve are 26 and 15 mm Hg respectively. AVA (VTI ) is 1.16 cm2. Dimensionless index is 0.44. Overall ocnsistent with mild to moderate AS. Marland Kitchen Aortic valve  regurgitation is not visualized. FINDINGS  Left Ventricle: Left ventricular ejection fraction, by estimation, is 60 to 65%. The left ventricle has normal function. The left ventricle has no regional wall motion abnormalities. The left ventricular internal cavity size was normal in size. There is  mild left ventricular hypertrophy. Left ventricular diastolic parameters are consistent with Grade I diastolic dysfunction (impaired relaxation). Elevated left atrial pressure. Right Ventricle: The right ventricular size is normal. Right vetricular wall thickness was not well visualized. Right ventricular systolic function is normal. Left Atrium: Left atrial size was mildly dilated. Right Atrium: Right atrial  size was normal in size. Pericardium: There is no evidence of pericardial effusion. Mitral Valve: There is mild thickening of the mitral valve leaflet(s). Trivial mitral valve regurgitation. Tricuspid Valve: The tricuspid valve is normal in structure. Tricuspid valve regurgitation is trivial. Aortic Valve: AV is thickened, calcified ? functionally bicuspid with fusion of L and noncoronary cusps. Peak and mean gradients through the valve are 26 and 15 mm Hg respectively. AVA (VTI ) is 1.16 cm2. Dimensionless index is 0.44. Overall ocnsistent with mild to moderate AS. Aortic valve regurgitation is not visualized. Aortic valve mean gradient measures 16.0 mmHg. Aortic valve peak gradient measures 27.6 mmHg. Aortic valve area, by VTI measures 1.11 cm. Pulmonic Valve: The pulmonic valve was grossly normal. Pulmonic valve regurgitation is not visualized. Aorta: The aortic root and ascending aorta are structurally normal, with no evidence of dilitation. IAS/Shunts: No atrial level shunt detected by color flow Doppler.  LEFT VENTRICLE PLAX 2D LVIDd:         4.40 cm   Diastology LVIDs:         2.70 cm   LV e' medial:    4.65 cm/s LV PW:         1.30 cm   LV E/e' medial:  22.2 LV IVS:        1.10 cm   LV e' lateral:   7.43 cm/s  LVOT diam:     1.80 cm   LV E/e' lateral: 13.9 LV SV:         75 LV SV Index:   40 LVOT Area:     2.54 cm  RIGHT VENTRICLE             IVC RV Basal diam:  3.30 cm     IVC diam: 2.00 cm RV S prime:     12.40 cm/s TAPSE (M-mode): 2.3 cm LEFT ATRIUM             Index        RIGHT ATRIUM           Index LA diam:        3.70 cm 1.99 cm/m   RA Area:     14.90 cm LA Vol (A2C):   77.0 ml 41.31 ml/m  RA Volume:   35.10 ml  18.83 ml/m LA Vol (A4C):   60.7 ml 32.57 ml/m LA Biplane Vol: 73.7 ml 39.54 ml/m  AORTIC VALVE                     PULMONIC VALVE AV Area (Vmax):    1.10 cm      PV Vmax:       0.95 m/s AV Area (Vmean):   1.06 cm      PV Peak grad:  3.6 mmHg AV Area (VTI):     1.11 cm AV Vmax:           262.50 cm/s AV Vmean:          190.500 cm/s AV VTI:            0.670 m AV Peak Grad:      27.6 mmHg AV Mean Grad:      16.0 mmHg LVOT Vmax:         113.00 cm/s LVOT Vmean:        79.700 cm/s LVOT VTI:          0.293 m LVOT/AV VTI ratio: 0.44  AORTA Ao Root diam: 2.90 cm Ao Asc diam:  3.60  cm MITRAL VALVE                TRICUSPID VALVE MV Area (PHT): 2.54 cm     TR Peak grad:   14.9 mmHg MV Decel Time: 299 msec     TR Vmax:        193.00 cm/s MV E velocity: 103.00 cm/s MV A velocity: 125.00 cm/s  SHUNTS MV E/A ratio:  0.82         Systemic VTI:  0.29 m                             Systemic Diam: 1.80 cm Dietrich Pates MD Electronically signed by Dietrich Pates MD Signature Date/Time: 04/24/2023/4:41:23 PM    Final      I have independently reviewed the above radiologic studies and discussed with the patient   Recent Lab Findings: Lab Results  Component Value Date   WBC 5.3 04/26/2023   HGB 9.7 (L) 04/26/2023   HCT 30.1 (L) 04/26/2023   PLT 122 (L) 04/26/2023   GLUCOSE 217 (H) 04/26/2023   CHOL 154 04/25/2023   TRIG 52 04/25/2023   HDL 57 04/25/2023   LDLCALC 87 04/25/2023   ALT 11 04/24/2023   AST 14 (L) 04/24/2023   NA 138 04/26/2023   K 3.2 (L) 04/26/2023   CL 105 04/26/2023   CREATININE 1.19  04/26/2023   BUN 25 (H) 04/26/2023   CO2 24 04/26/2023   TSH 1.160 04/05/2014   INR 1.1 04/24/2023   HGBA1C 7.1 (A) 04/01/2023   Assessment / Plan:   Coronary artery disease-on Heparin drip.Dr. Laneta Simmers to evaluate but appears an unlikely candidate for CABG Mild to moderate AS by echo-peak gradient 27.6 mm Hg, ? functionally bicuspid. Per cath report, no aortic stenosis History of DM and neuropathy-HGA1C 7.1 History of hypertension-on Amlodipine History of thrombocytopenia-platelets this am 122,000 6. History of hyperlipidemia-on Crestor 7. History of PAD, right BKA- right prosthesis   I  spent 20 minutes counseling the patient face to face.   Doree Fudge PA-C 04/26/2023 7:41 AM    Chart reviewed, patient examined, agree with above.  This 82 year old gentleman has a history of hypertension, suboptimally controlled diabetes, severe aortoiliac and peripheral vascular disease status post right BKA with left calf claudication, coronary artery disease with multiple prior stents in the LAD, left circumflex, and RCA, and severe degenerative arthritis status post right hip replacement in the past with revision, left knee replacement, and severe left shoulder rotator cuff tear with limited mobility who presents with new onset substernal chest discomfort with a troponin of 20.  Echocardiogram showed a possible functionally bicuspid aortic valve with fusion of the left and noncoronary cusps with a mean gradient of 15 mmHg and a peak gradient 26 mmHg suggesting mild to moderate aortic stenosis.  LV systolic function is normal with ejection fraction of 60 to 65%.  Cardiac catheterization shows severe multivessel coronary disease with moderate in-stent restenosis in the LAD and RCA as well as within the left circumflex.  I have personally reviewed the cardiac catheterization and echo studies and examined and interviewed the patient and his daughter.  His LAD looks like a graftable vessel distally.   The diagonal branch has high-grade mid vessel stenosis and beyond that I think the vessel is probably too small and diseased to graft.  The left circumflex marginal is stented throughout with a distal stent and beyond that there is a very  short segment before a bifurcation into 2 small branches that have distal disease.  Most likely this area beyond the stent is intramyocardial and may not be accessible.  I am not sure how much a graft would do there since there are 2 small branches beyond that with distal disease.  The RCA is diffusely diseased and the PDA and PL appear fairly small compared to the coronary catheter.  I think coronary bypass graft surgery would not be advisable in this patient for multiple reasons including his advanced age combined with severe aortoiliac and peripheral vascular disease status post right BKA amputation, diabetes, limited mobility even with his prosthesis and inability to ambulate postoperatively for several weeks until his prosthesis can be refitted, inability to graft more than his LAD with certainty.  His risk of perioperative complications is significant and I think it is unlikely that he will recover to go home and be independent again.  He said that his most important objective is to remain independent at home while he is alive and would not want to go to a skilled nursing facility.  I think the best option for this patient would be to consider whether PCI is possible for any of his vessels and if not,  continued medical therapy.  I discussed all this in detail with the patient and his daughter and answered their questions.  They said that they understand and would certainly not want him to have a surgery that may prevent him from getting back home and being independent.  Alleen Borne, MD

## 2023-04-26 NOTE — Progress Notes (Addendum)
Rounding Note    Patient Name: Keith Espinoza Date of Encounter: 04/26/2023  [facility] Cardiologist: Lance Muss, MD   Subjective   No chest pain. CVTS at the bedside.   Inpatient Medications    Scheduled Meds:  allopurinol  300 mg Oral Daily   amLODipine  2.5 mg Oral Daily   aspirin EC  81 mg Oral Daily   insulin aspart  0-5 Units Subcutaneous QHS   insulin aspart  0-9 Units Subcutaneous TID WC   isosorbide mononitrate  30 mg Oral Daily   rosuvastatin  20 mg Oral Daily   sodium chloride flush  3 mL Intravenous Q12H   sodium chloride flush  3 mL Intravenous Q12H   Continuous Infusions:  sodium chloride     sodium chloride     heparin 1,100 Units/hr (04/26/23 0331)   PRN Meds: sodium chloride, sodium chloride, acetaminophen **OR** acetaminophen, hydrALAZINE, morphine injection, nitroGLYCERIN, ondansetron **OR** ondansetron (ZOFRAN) IV, sodium chloride flush, sodium chloride flush   Vital Signs    Vitals:   04/25/23 1600 04/25/23 2116 04/26/23 0019 04/26/23 0447  BP: (!) 163/64 (!) 149/60 (!) 156/59 (!) 169/62  Pulse: (!) 54 63 (!) 54 (!) 50  Resp: 16 16 14 16   Temp:  97.9 F (36.6 C) 98.6 F (37 C) 98.5 F (36.9 C)  TempSrc:  Oral Oral Oral  SpO2: 98% 99% 97% 98%  Weight:    71.9 kg  Height:        Intake/Output Summary (Last 24 hours) at 04/26/2023 0717 Last data filed at 04/26/2023 0441 Gross per 24 hour  Intake 58.12 ml  Output 825 ml  Net -766.88 ml      04/26/2023    4:47 AM 04/25/2023    3:09 AM 04/24/2023    3:58 AM  Last 3 Weights  Weight (lbs) 158 lb 9.6 oz 152 lb 14.4 oz 153 lb 3.2 oz  Weight (kg) 71.94 kg 69.355 kg 69.491 kg      Telemetry    Sinus Bradycardia - Personally Reviewed  ECG    No new tracing this morning  Physical Exam   GEN: No acute distress.   Neck: No JVD Cardiac: RRR, soft systolic murmur, no rubs, or gallops.  Respiratory: Clear to auscultation bilaterally. GI: Soft, nontender, non-distended   MS: R BKA, right radial cath site stable.  Neuro:  Nonfocal  Psych: Normal affect   Labs    High Sensitivity Troponin:   Recent Labs  Lab 04/22/23 2256 04/23/23 0056 04/23/23 0650 04/23/23 0801  TROPONINIHS 10 12 20* 19*     Chemistry Recent Labs  Lab 04/22/23 2256 04/24/23 0233 04/25/23 0441  NA 141 139 139  K 3.7 3.2* 3.6  CL 102 104 103  CO2 28 26 26   GLUCOSE 304* 248* 214*  BUN 28* 29* 29*  CREATININE 1.35* 1.39* 1.40*  CALCIUM 10.0 9.3 9.3  PROT  --  6.2*  --   ALBUMIN  --  3.4*  --   AST  --  14*  --   ALT  --  11  --   ALKPHOS  --  61  --   BILITOT  --  0.1*  --   GFRNONAA 52* 51* 50*  ANIONGAP 11 9 10     Lipids  Recent Labs  Lab 04/25/23 2341  CHOL 154  TRIG 52  HDL 57  LDLCALC 87  CHOLHDL 2.7    Hematology Recent Labs  Lab 04/24/23 0233 04/25/23 0441  04/26/23 0622  WBC 5.3 5.7 5.3  RBC 3.34* 3.28* 3.28*  HGB 9.9* 10.0* 9.7*  HCT 30.9* 31.0* 30.1*  MCV 92.5 94.5 91.8  MCH 29.6 30.5 29.6  MCHC 32.0 32.3 32.2  RDW 16.4* 16.3* 16.6*  PLT 114* 116* 122*   Thyroid No results for input(s): "TSH", "FREET4" in the last 168 hours.  BNPNo results for input(s): "BNP", "PROBNP" in the last 168 hours.  DDimer No results for input(s): "DDIMER" in the last 168 hours.   Radiology    CARDIAC CATHETERIZATION  Result Date: 04/25/2023   Mid LAD-1 lesion is 75% stenosed with 70% stenosed side branch in 1st Diag. - just prior to stent.  Mid LAD-2 STENT is 40% stenosed.  Mid LAD-3 lesion is 75% stenosed with 60% stenosed side branch in 2nd Diag at distal edge of STENT   1st Diag lesion is 90% stenosed.   Prox Cx to Mid Cx STENT segment is 45% stenosed with 90% stenosed side branch in 4th Mrg.  Mid Cx to Dist Cx lesion is 95% stenosed just after stent   Dist Cx-1 lesion is 95% stenosed prior to stent. Previously placed Dist Cx-2 DES STENT is widely patent.   Prox RCA-1 lesion is 70% stenosed.  Prox RCA-2 lesion is 85% stenosed. Prox RCA-3 lesion is 80%  stenosed. Prox RCA to Mid RCA lesion is 95% stenosed.   Mid RCA STENT is 35% stenosed.   RPAV lesion is 60% stenosed.   RPDA lesion is 80% stenosed.   There is no aortic valve stenosis.  Severe MV CAD - with moderate ISR in LAD/RCA & LCx but each stent with stent edge lesions.  Best option is CABG -- RECOMMENDATIONS   CVTS Consultation   Recommend Aspirin 81mg  daily for moderate CAD.   D/c Plavix for planned CVTS consult; Have ordered IV Heparin 2 hr post TR Band off   ECHOCARDIOGRAM COMPLETE  Result Date: 04/24/2023    ECHOCARDIOGRAM REPORT   Patient Name:   KALYAN BARABAS Date of Exam: 04/24/2023 Medical Rec #:  [id number]      Height:       70.0 in Accession #:    [id number]     Weight:       153.2 lb Date of Birth:  07/19/1941      BSA:          1.864 m Patient Age:    82 years       BP:           127/64 mmHg Patient Gender: M              HR:           54 bpm. Exam Location:  Inpatient Procedure: 2D Echo, Cardiac Doppler and Color Doppler Indications:    Aortic stenosis I35.0  History:        Patient has no prior history of Echocardiogram examinations.                 CAD, Aortic Valve Disease; Risk Factors:Hypertension, Diabetes,                 Dyslipidemia and Former Smoker.  Sonographer:    Dondra Prader RVT RCS Referring Phys: [id number] BRIAN S CRENSHAW IMPRESSIONS  1. Left ventricular ejection fraction, by estimation, is 60 to 65%. The left ventricle has normal function. The left ventricle has no regional wall motion abnormalities. There is mild left ventricular hypertrophy. Left ventricular diastolic parameters are consistent with  Grade I diastolic dysfunction (impaired relaxation). Elevated left atrial pressure.  2. Right ventricular systolic function is normal. The right ventricular size is normal.  3. Left atrial size was mildly dilated.  4. Trivial mitral valve regurgitation.  5. AV is thickened, calcified ? functionally bicuspid with fusion of L and noncoronary cusps. Peak and mean gradients through the  valve are 26 and 15 mm Hg respectively. AVA (VTI ) is 1.16 cm2. Dimensionless index is 0.44. Overall ocnsistent with mild to moderate AS. Marland Kitchen Aortic valve regurgitation is not visualized. FINDINGS  Left Ventricle: Left ventricular ejection fraction, by estimation, is 60 to 65%. The left ventricle has normal function. The left ventricle has no regional wall motion abnormalities. The left ventricular internal cavity size was normal in size. There is  mild left ventricular hypertrophy. Left ventricular diastolic parameters are consistent with Grade I diastolic dysfunction (impaired relaxation). Elevated left atrial pressure. Right Ventricle: The right ventricular size is normal. Right vetricular wall thickness was not well visualized. Right ventricular systolic function is normal. Left Atrium: Left atrial size was mildly dilated. Right Atrium: Right atrial size was normal in size. Pericardium: There is no evidence of pericardial effusion. Mitral Valve: There is mild thickening of the mitral valve leaflet(s). Trivial mitral valve regurgitation. Tricuspid Valve: The tricuspid valve is normal in structure. Tricuspid valve regurgitation is trivial. Aortic Valve: AV is thickened, calcified ? functionally bicuspid with fusion of L and noncoronary cusps. Peak and mean gradients through the valve are 26 and 15 mm Hg respectively. AVA (VTI ) is 1.16 cm2. Dimensionless index is 0.44. Overall ocnsistent with mild to moderate AS. Aortic valve regurgitation is not visualized. Aortic valve mean gradient measures 16.0 mmHg. Aortic valve peak gradient measures 27.6 mmHg. Aortic valve area, by VTI measures 1.11 cm. Pulmonic Valve: The pulmonic valve was grossly normal. Pulmonic valve regurgitation is not visualized. Aorta: The aortic root and ascending aorta are structurally normal, with no evidence of dilitation. IAS/Shunts: No atrial level shunt detected by color flow Doppler.  LEFT VENTRICLE PLAX 2D LVIDd:         4.40 cm    Diastology LVIDs:         2.70 cm   LV e' medial:    4.65 cm/s LV PW:         1.30 cm   LV E/e' medial:  22.2 LV IVS:        1.10 cm   LV e' lateral:   7.43 cm/s LVOT diam:     1.80 cm   LV E/e' lateral: 13.9 LV SV:         75 LV SV Index:   40 LVOT Area:     2.54 cm  RIGHT VENTRICLE             IVC RV Basal diam:  3.30 cm     IVC diam: 2.00 cm RV S prime:     12.40 cm/s TAPSE (M-mode): 2.3 cm LEFT ATRIUM             Index        RIGHT ATRIUM           Index LA diam:        3.70 cm 1.99 cm/m   RA Area:     14.90 cm LA Vol (A2C):   77.0 ml 41.31 ml/m  RA Volume:   35.10 ml  18.83 ml/m LA Vol (A4C):   60.7 ml 32.57 ml/m LA Biplane Vol: 73.7 ml 39.54  ml/m  AORTIC VALVE                     PULMONIC VALVE AV Area (Vmax):    1.10 cm      PV Vmax:       0.95 m/s AV Area (Vmean):   1.06 cm      PV Peak grad:  3.6 mmHg AV Area (VTI):     1.11 cm AV Vmax:           262.50 cm/s AV Vmean:          190.500 cm/s AV VTI:            0.670 m AV Peak Grad:      27.6 mmHg AV Mean Grad:      16.0 mmHg LVOT Vmax:         113.00 cm/s LVOT Vmean:        79.700 cm/s LVOT VTI:          0.293 m LVOT/AV VTI ratio: 0.44  AORTA Ao Root diam: 2.90 cm Ao Asc diam:  3.60 cm MITRAL VALVE                TRICUSPID VALVE MV Area (PHT): 2.54 cm     TR Peak grad:   14.9 mmHg MV Decel Time: 299 msec     TR Vmax:        193.00 cm/s MV E velocity: 103.00 cm/s MV A velocity: 125.00 cm/s  SHUNTS MV E/A ratio:  0.82         Systemic VTI:  0.29 m                             Systemic Diam: 1.80 cm Dietrich Pates MD Electronically signed by Dietrich Pates MD Signature Date/Time: 04/24/2023/4:41:23 PM    Final     Cardiac Studies   Cath: 04/25/2023    Mid LAD-1 lesion is 75% stenosed with 70% stenosed side branch in 1st Diag. - just prior to stent.  Mid LAD-2 STENT is 40% stenosed.  Mid LAD-3 lesion is 75% stenosed with 60% stenosed side branch in 2nd Diag at distal edge of STENT   1st Diag lesion is 90% stenosed.   Prox Cx to Mid Cx STENT segment is 45%  stenosed with 90% stenosed side branch in 4th Mrg.  Mid Cx to Dist Cx lesion is 95% stenosed just after stent   Dist Cx-1 lesion is 95% stenosed prior to stent. Previously placed Dist Cx-2 DES STENT is widely patent.   Prox RCA-1 lesion is 70% stenosed.  Prox RCA-2 lesion is 85% stenosed. Prox RCA-3 lesion is 80% stenosed. Prox RCA to Mid RCA lesion is 95% stenosed.   Mid RCA STENT is 35% stenosed.   RPAV lesion is 60% stenosed.   RPDA lesion is 80% stenosed.   There is no aortic valve stenosis.    Severe MV CAD - with moderate ISR in LAD/RCA & LCx but each stent with stent edge lesions.  Best option is CABG --    RECOMMENDATIONS     CVTS Consultation   Recommend Aspirin 81mg  daily for moderate CAD.   D/c Plavix for planned CVTS consult; Have ordered IV Heparin 2 hr post TR Band off  Diagnostic Dominance: Right   Echo: 04/24/2023  IMPRESSIONS     1. Left ventricular ejection fraction, by estimation, is 60 to 65%. The  left ventricle has  normal function. The left ventricle has no regional  wall motion abnormalities. There is mild left ventricular hypertrophy.  Left ventricular diastolic parameters  are consistent with Grade I diastolic dysfunction (impaired relaxation).  Elevated left atrial pressure.   2. Right ventricular systolic function is normal. The right ventricular  size is normal.   3. Left atrial size was mildly dilated.   4. Trivial mitral valve regurgitation.   5. AV is thickened, calcified ? functionally bicuspid with fusion of L  and noncoronary cusps. Peak and mean gradients through the valve are 26  and 15 mm Hg respectively. AVA (VTI ) is 1.16 cm2. Dimensionless index is  0.44. Overall ocnsistent with mild  to moderate AS. Marland Kitchen Aortic valve regurgitation is not visualized.   FINDINGS   Left Ventricle: Left ventricular ejection fraction, by estimation, is 60  to 65%. The left ventricle has normal function. The left ventricle has no  regional wall motion  abnormalities. The left ventricular internal cavity  size was normal in size. There is   mild left ventricular hypertrophy. Left ventricular diastolic parameters  are consistent with Grade I diastolic dysfunction (impaired relaxation).  Elevated left atrial pressure.   Right Ventricle: The right ventricular size is normal. Right vetricular  wall thickness was not well visualized. Right ventricular systolic  function is normal.   Left Atrium: Left atrial size was mildly dilated.   Right Atrium: Right atrial size was normal in size.   Pericardium: There is no evidence of pericardial effusion.   Mitral Valve: There is mild thickening of the mitral valve leaflet(s).  Trivial mitral valve regurgitation.   Tricuspid Valve: The tricuspid valve is normal in structure. Tricuspid  valve regurgitation is trivial.   Aortic Valve: AV is thickened, calcified ? functionally bicuspid with  fusion of L and noncoronary cusps. Peak and mean gradients through the  valve are 26 and 15 mm Hg respectively. AVA (VTI ) is 1.16 cm2.  Dimensionless index is 0.44. Overall ocnsistent  with mild to moderate AS. Aortic valve regurgitation is not visualized.  Aortic valve mean gradient measures 16.0 mmHg. Aortic valve peak gradient  measures 27.6 mmHg. Aortic valve area, by VTI measures 1.11 cm.   Pulmonic Valve: The pulmonic valve was grossly normal. Pulmonic valve  regurgitation is not visualized.   Aorta: The aortic root and ascending aorta are structurally normal, with  no evidence of dilitation.   IAS/Shunts: No atrial level shunt detected by color flow Doppler.   Patient Profile     82 y.o. male with PMH of coronary artery disease DES to LAD, RCA, left circumflex in 2004, RCA 2012, circumflex 2013, diabetes mellitus, hypertension, peripheral vascular disease, s/p RBKA.  Admitted with chest pain.  Assessment & Plan    Unstable Angina -- Presented with chest pain relieved with sublingual glycerin.   High-sensitivity troponin minimally elevated with flat trend of 10>> 12>> 20>>19.  EKG with right bundle branch block, left posterior fascicular block.  -- Underwent cardiac catheterization noted above with severe multivessel CAD, moderate in-stent restenosis in LAD, RCA and circumflex.  Recommendations for CVTS consult. CVTS at the bedside, suspect he may not be a surgical candidate -- Continue IV heparin, aspirin, Plavix has been held -- Continue Imdur 30 mg daily, Crestor 20 mg daily  Hypertension -- Blood pressures elevated this morning -- Beta-blocker was stopped secondary to bradycardia -- Increase amlodipine to 7.5 mg daily, continue Imdur 30 mg daily -- Hydrochlorothiazide held on admission  Hyperlipidemia -- LDL 87,  HDL 57 -- Crestor 20 mg daily -- History of statin intolerance as well as Zetia -- Consider lipid clinic referral for PCSK9 inhibitor as an outpatient  Hypokalemia -- K+ 3.2, supplement  Mild to moderate AS -- Echo this admission showed EF 60 to 65% with mild LVH, grade 1 DD, possible functionally bicuspid aortic valve with fusion of the left and noncoronary cusp with mean gradient of 15 mmHg, DVI 0.44 and AVA 1.1 cm. It is felt that aortic valve area is underestimated in the setting of small LVOT measurement.   For questions or updates, please contact [facility] Please consult [email or web address] for contact info under        Signed, Laverda Page, NP  04/26/2023, 7:17 AM

## 2023-04-27 DIAGNOSIS — E7800 Pure hypercholesterolemia, unspecified: Secondary | ICD-10-CM | POA: Diagnosis not present

## 2023-04-27 DIAGNOSIS — I10 Essential (primary) hypertension: Secondary | ICD-10-CM | POA: Diagnosis not present

## 2023-04-27 DIAGNOSIS — I2583 Coronary atherosclerosis due to lipid rich plaque: Secondary | ICD-10-CM | POA: Diagnosis not present

## 2023-04-27 DIAGNOSIS — R079 Chest pain, unspecified: Secondary | ICD-10-CM | POA: Diagnosis not present

## 2023-04-27 DIAGNOSIS — I25110 Atherosclerotic heart disease of native coronary artery with unstable angina pectoris: Secondary | ICD-10-CM | POA: Diagnosis not present

## 2023-04-27 LAB — CBC
HCT: 29 % — ABNORMAL LOW (ref 39.0–52.0)
Hemoglobin: 9.5 g/dL — ABNORMAL LOW (ref 13.0–17.0)
MCH: 29.6 pg (ref 26.0–34.0)
MCHC: 32.8 g/dL (ref 30.0–36.0)
MCV: 90.3 fL (ref 80.0–100.0)
Platelets: 117 10*3/uL — ABNORMAL LOW (ref 150–400)
RBC: 3.21 MIL/uL — ABNORMAL LOW (ref 4.22–5.81)
RDW: 16.6 % — ABNORMAL HIGH (ref 11.5–15.5)
WBC: 5.1 10*3/uL (ref 4.0–10.5)
nRBC: 0 % (ref 0.0–0.2)

## 2023-04-27 LAB — BASIC METABOLIC PANEL
Anion gap: 12 (ref 5–15)
BUN: 22 mg/dL (ref 8–23)
CO2: 22 mmol/L (ref 22–32)
Calcium: 9.4 mg/dL (ref 8.9–10.3)
Chloride: 106 mmol/L (ref 98–111)
Creatinine, Ser: 1.21 mg/dL (ref 0.61–1.24)
GFR, Estimated: 60 mL/min — ABNORMAL LOW (ref >60)
Glucose, Bld: 185 mg/dL — ABNORMAL HIGH (ref 70–99)
Potassium: 3.6 mmol/L (ref 3.5–5.1)
Sodium: 140 mmol/L (ref 135–145)

## 2023-04-27 LAB — GLUCOSE, CAPILLARY
Glucose-Capillary: 355 mg/dL — ABNORMAL HIGH (ref 70–99)
Glucose-Capillary: 219 mg/dL — ABNORMAL HIGH (ref 70–99)

## 2023-04-27 LAB — HEPARIN LEVEL (UNFRACTIONATED): Heparin Unfractionated: 0.42 IU/mL (ref 0.30–0.70)

## 2023-04-27 LAB — MAGNESIUM: Magnesium: 1.7 mg/dL (ref 1.7–2.4)

## 2023-04-27 MED ORDER — CLOPIDOGREL BISULFATE 75 MG PO TABS
75.0000 mg | ORAL_TABLET | Freq: Every day | ORAL | Status: DC
  Administered 2023-04-27: 75 mg via ORAL
  Filled 2023-04-27: qty 1

## 2023-04-27 MED ORDER — ISOSORBIDE MONONITRATE ER 60 MG PO TB24: 60.0000 mg | ORAL_TABLET | Freq: Every day | ORAL | 0 refills | Status: DC

## 2023-04-27 MED ORDER — LOSARTAN POTASSIUM 25 MG PO TABS: 25.0000 mg | ORAL_TABLET | Freq: Every day | ORAL | 0 refills | Status: DC

## 2023-04-27 MED ORDER — ROSUVASTATIN CALCIUM 20 MG PO TABS: 20.0000 mg | ORAL_TABLET | Freq: Every day | ORAL | 0 refills | Status: DC

## 2023-04-27 MED ORDER — AMLODIPINE BESYLATE 10 MG PO TABS
10.0000 mg | ORAL_TABLET | Freq: Every day | ORAL | Status: DC
  Administered 2023-04-27: 10 mg via ORAL
  Filled 2023-04-27: qty 1

## 2023-04-27 MED ORDER — MAGNESIUM SULFATE 2 GM/50ML IV SOLN
2.0000 g | Freq: Once | INTRAVENOUS | Status: AC
  Administered 2023-04-27: 2 g via INTRAVENOUS
  Filled 2023-04-27: qty 50

## 2023-04-27 MED ORDER — AMLODIPINE BESYLATE 10 MG PO TABS: 10.0000 mg | ORAL_TABLET | Freq: Every day | ORAL | 0 refills | Status: DC

## 2023-04-27 MED ORDER — POTASSIUM CHLORIDE CRYS ER 20 MEQ PO TBCR
40.0000 meq | EXTENDED_RELEASE_TABLET | Freq: Once | ORAL | Status: AC
  Administered 2023-04-27: 40 meq via ORAL
  Filled 2023-04-27: qty 2

## 2023-04-27 NOTE — Progress Notes (Addendum)
Rounding Note    Patient Name: Keith Espinoza Date of Encounter: 04/27/2023  [facility] Cardiologist: Lance Muss, MD   Subjective   Feeling well this morning. No chest pain.   Inpatient Medications    Scheduled Meds:  allopurinol  300 mg Oral Daily   amLODipine  5 mg Oral Daily   aspirin EC  81 mg Oral Daily   insulin aspart  0-5 Units Subcutaneous QHS   insulin aspart  0-9 Units Subcutaneous TID WC   isosorbide mononitrate  60 mg Oral Daily   losartan  25 mg Oral Daily   rosuvastatin  20 mg Oral Daily   sodium chloride flush  3 mL Intravenous Q12H   sodium chloride flush  3 mL Intravenous Q12H   Continuous Infusions:  sodium chloride     sodium chloride     heparin 1,100 Units/hr (04/26/23 1126)   PRN Meds: sodium chloride, sodium chloride, acetaminophen **OR** acetaminophen, hydrALAZINE, morphine injection, nitroGLYCERIN, ondansetron **OR** ondansetron (ZOFRAN) IV, sodium chloride flush, sodium chloride flush   Vital Signs    Vitals:   04/26/23 1939 04/27/23 0056 04/27/23 0341 04/27/23 0700  BP: (!) 143/58 (!) 160/66 (!) 157/65 (!) 158/78  Pulse: (!) 54 67 (!) 57   Resp: 18     Temp: 98.3 F (36.8 C)  98.9 F (37.2 C) 98.1 F (36.7 C)  TempSrc: Oral  Oral Oral  SpO2: 98% 94% 95%   Weight:   67.7 kg   Height:        Intake/Output Summary (Last 24 hours) at 04/27/2023 0829 Last data filed at 04/27/2023 0344 Gross per 24 hour  Intake --  Output 900 ml  Net -900 ml      04/27/2023    3:41 AM 04/26/2023    4:47 AM 04/25/2023    3:09 AM  Last 3 Weights  Weight (lbs) 149 lb 4 oz 158 lb 9.6 oz 152 lb 14.4 oz  Weight (kg) 67.7 kg 71.94 kg 69.355 kg      Telemetry    Sinus Rhythm - Personally Reviewed  ECG    No new tracing  Physical Exam   GEN: No acute distress.   Neck: No JVD Cardiac: RRR, + systolic murmur, no rubs, or gallops.  Respiratory: Clear to auscultation bilaterally. GI: Soft, nontender, non-distended  MS: No  edema; No deformity. Right radial cath site stable. Right BKA Neuro:  Nonfocal  Psych: Normal affect   Labs    High Sensitivity Troponin:   Recent Labs  Lab 04/22/23 2256 04/23/23 0056 04/23/23 0650 04/23/23 0801  TROPONINIHS 10 12 20* 19*     Chemistry Recent Labs  Lab 04/24/23 0233 04/25/23 0441 04/26/23 0622 04/27/23 0403  NA 139 139 138 140  K 3.2* 3.6 3.2* 3.6  CL 104 103 105 106  CO2 26 26 24 22   GLUCOSE 248* 214* 217* 185*  BUN 29* 29* 25* 22  CREATININE 1.39* 1.40* 1.19 1.21  CALCIUM 9.3 9.3 9.3 9.4  MG  --   --   --  1.7  PROT 6.2*  --   --   --   ALBUMIN 3.4*  --   --   --   AST 14*  --   --   --   ALT 11  --   --   --   ALKPHOS 61  --   --   --   BILITOT 0.1*  --   --   --  GFRNONAA 51* 50* >60 60*  ANIONGAP 9 10 9 12     Lipids  Recent Labs  Lab 04/25/23 2341  CHOL 154  TRIG 52  HDL 57  LDLCALC 87  CHOLHDL 2.7    Hematology Recent Labs  Lab 04/25/23 0441 04/26/23 0622 04/27/23 0403  WBC 5.7 5.3 5.1  RBC 3.28* 3.28* 3.21*  HGB 10.0* 9.7* 9.5*  HCT 31.0* 30.1* 29.0*  MCV 94.5 91.8 90.3  MCH 30.5 29.6 29.6  MCHC 32.3 32.2 32.8  RDW 16.3* 16.6* 16.6*  PLT 116* 122* 117*   Thyroid No results for input(s): "TSH", "FREET4" in the last 168 hours.  BNPNo results for input(s): "BNP", "PROBNP" in the last 168 hours.  DDimer No results for input(s): "DDIMER" in the last 168 hours.   Radiology    CARDIAC CATHETERIZATION  Result Date: 04/25/2023   Mid LAD-1 lesion is 75% stenosed with 70% stenosed side branch in 1st Diag. - just prior to stent.  Mid LAD-2 STENT is 40% stenosed.  Mid LAD-3 lesion is 75% stenosed with 60% stenosed side branch in 2nd Diag at distal edge of STENT   1st Diag lesion is 90% stenosed.   Prox Cx to Mid Cx STENT segment is 45% stenosed with 90% stenosed side branch in 4th Mrg.  Mid Cx to Dist Cx lesion is 95% stenosed just after stent   Dist Cx-1 lesion is 95% stenosed prior to stent. Previously placed Dist Cx-2 DES STENT  is widely patent.   Prox RCA-1 lesion is 70% stenosed.  Prox RCA-2 lesion is 85% stenosed. Prox RCA-3 lesion is 80% stenosed. Prox RCA to Mid RCA lesion is 95% stenosed.   Mid RCA STENT is 35% stenosed.   RPAV lesion is 60% stenosed.   RPDA lesion is 80% stenosed.   There is no aortic valve stenosis.  Severe MV CAD - with moderate ISR in LAD/RCA & LCx but each stent with stent edge lesions.  Best option is CABG -- RECOMMENDATIONS   CVTS Consultation   Recommend Aspirin 81mg  daily for moderate CAD.   D/c Plavix for planned CVTS consult; Have ordered IV Heparin 2 hr post TR Band off    Cardiac Studies   Cath: 04/25/2023     Mid LAD-1 lesion is 75% stenosed with 70% stenosed side branch in 1st Diag. - just prior to stent.  Mid LAD-2 STENT is 40% stenosed.  Mid LAD-3 lesion is 75% stenosed with 60% stenosed side branch in 2nd Diag at distal edge of STENT   1st Diag lesion is 90% stenosed.   Prox Cx to Mid Cx STENT segment is 45% stenosed with 90% stenosed side branch in 4th Mrg.  Mid Cx to Dist Cx lesion is 95% stenosed just after stent   Dist Cx-1 lesion is 95% stenosed prior to stent. Previously placed Dist Cx-2 DES STENT is widely patent.   Prox RCA-1 lesion is 70% stenosed.  Prox RCA-2 lesion is 85% stenosed. Prox RCA-3 lesion is 80% stenosed. Prox RCA to Mid RCA lesion is 95% stenosed.   Mid RCA STENT is 35% stenosed.   RPAV lesion is 60% stenosed.   RPDA lesion is 80% stenosed.   There is no aortic valve stenosis.    Severe MV CAD - with moderate ISR in LAD/RCA & LCx but each stent with stent edge lesions.  Best option is CABG --    RECOMMENDATIONS     CVTS Consultation   Recommend Aspirin 81mg  daily for moderate CAD.  D/c Plavix for planned CVTS consult; Have ordered IV Heparin 2 hr post TR Band off   Diagnostic Dominance: Right    Echo: 04/24/2023   IMPRESSIONS     1. Left ventricular ejection fraction, by estimation, is 60 to 65%. The  left ventricle has normal function. The  left ventricle has no regional  wall motion abnormalities. There is mild left ventricular hypertrophy.  Left ventricular diastolic parameters  are consistent with Grade I diastolic dysfunction (impaired relaxation).  Elevated left atrial pressure.   2. Right ventricular systolic function is normal. The right ventricular  size is normal.   3. Left atrial size was mildly dilated.   4. Trivial mitral valve regurgitation.   5. AV is thickened, calcified ? functionally bicuspid with fusion of L  and noncoronary cusps. Peak and mean gradients through the valve are 26  and 15 mm Hg respectively. AVA (VTI ) is 1.16 cm2. Dimensionless index is  0.44. Overall ocnsistent with mild  to moderate AS. Marland Kitchen Aortic valve regurgitation is not visualized.   FINDINGS   Left Ventricle: Left ventricular ejection fraction, by estimation, is 60  to 65%. The left ventricle has normal function. The left ventricle has no  regional wall motion abnormalities. The left ventricular internal cavity  size was normal in size. There is   mild left ventricular hypertrophy. Left ventricular diastolic parameters  are consistent with Grade I diastolic dysfunction (impaired relaxation).  Elevated left atrial pressure.   Right Ventricle: The right ventricular size is normal. Right vetricular  wall thickness was not well visualized. Right ventricular systolic  function is normal.   Left Atrium: Left atrial size was mildly dilated.   Right Atrium: Right atrial size was normal in size.   Pericardium: There is no evidence of pericardial effusion.   Mitral Valve: There is mild thickening of the mitral valve leaflet(s).  Trivial mitral valve regurgitation.   Tricuspid Valve: The tricuspid valve is normal in structure. Tricuspid  valve regurgitation is trivial.   Aortic Valve: AV is thickened, calcified ? functionally bicuspid with  fusion of L and noncoronary cusps. Peak and mean gradients through the  valve are 26 and 15  mm Hg respectively. AVA (VTI ) is 1.16 cm2.  Dimensionless index is 0.44. Overall ocnsistent  with mild to moderate AS. Aortic valve regurgitation is not visualized.  Aortic valve mean gradient measures 16.0 mmHg. Aortic valve peak gradient  measures 27.6 mmHg. Aortic valve area, by VTI measures 1.11 cm.   Pulmonic Valve: The pulmonic valve was grossly normal. Pulmonic valve  regurgitation is not visualized.   Aorta: The aortic root and ascending aorta are structurally normal, with  no evidence of dilitation.   IAS/Shunts: No atrial level shunt detected by color flow Doppler.   Patient Profile     82 y.o. male with PMH of coronary artery disease DES to LAD, RCA, left circumflex in 2004, RCA 2012, circumflex 2013, diabetes mellitus, hypertension, peripheral vascular disease, s/p RBKA.  Admitted with chest pain.   Assessment & Plan    Unstable Angina -- Presented with chest pain relieved with sublingual glycerin.  High-sensitivity troponin minimally elevated with flat trend of 10>> 12>> 20>>19.  EKG with right bundle branch block, left posterior fascicular block.  -- Underwent cardiac catheterization noted above with severe multivessel CAD, moderate in-stent restenosis in LAD, RCA and circumflex.  Recommendations for CVTS consult. Seen by Dr. Laneta Simmers and deemed not a surgery candidate. Reviewed with interventional MD. Will continue medical therapy  for now. Arrange for follow up in the office with Dr. Eldridge Dace to determine if there are any options for PCI.  -- stop IV heparin, and needs to ambulate -- Continue, aspirin, Plavix (resume at DC), Imdur 60 mg daily, Crestor 20 mg daily   Hypertension -- Blood pressures elevated this morning -- Beta-blocker was stopped secondary to bradycardia -- Increase amlodipine to 10mg  daily, continue Imdur 60 mg daily, losartan 25mg  daily    Hyperlipidemia -- LDL 87, HDL 57 -- Crestor 20 mg daily -- History of statin intolerance as well as Zetia --  plan for lipid clinic referral for PCSK9 inhibitor as an outpatient   Hypokalemia -- resolved -- supp mag   Mild to moderate AS -- Echo this admission showed EF 60 to 65% with mild LVH, grade 1 DD, possible functionally bicuspid aortic valve with fusion of the left and noncoronary cusp with mean gradient of 15 mmHg, DVI 0.44 and AVA 1.1 cm. It is felt that aortic valve area is underestimated in the setting of small LVOT measurement.   Will arrange for follow up in the office.  For questions or updates, please contact [facility] Please consult [email or web address] for contact info under        Signed, Laverda Page, NP  04/27/2023, 8:29 AM

## 2023-04-27 NOTE — Progress Notes (Signed)
ANTICOAGULATION CONSULT NOTE- Follow Up  Pharmacy Consult for heparin Indication: ACS/STEMI  Allergies  Allergen Reactions   Simvastatin Other (See Comments)    SEVERE MYALGIAS    Zetia [Ezetimibe] Other (See Comments)    MYALGIAS   Dilaudid [Hydromorphone Hcl] Other (See Comments)    hallucination    Patient Measurements: Height: 5\' 10"  (177.8 cm) Weight: 67.7 kg (149 lb 4 oz) IBW/kg (Calculated) : 73 Heparin Dosing Weight: 74 kg  Vital Signs: Temp: 98.1 F (36.7 C) (07/24 0700) Temp Source: Oral (07/24 0700) BP: 158/78 (07/24 0700) Pulse Rate: 57 (07/24 0341)  Labs: Recent Labs    04/25/23 0441 04/26/23 0622 04/27/23 0403  HGB 10.0* 9.7* 9.5*  HCT 31.0* 30.1* 29.0*  PLT 116* 122* 117*  HEPARINUNFRC 0.51 0.36 0.42  CREATININE 1.40* 1.19 1.21    Estimated Creatinine Clearance: 45.1 mL/min (by C-G formula based on SCr of 1.21 mg/dL).   Medical History: Past Medical History:  Diagnosis Date   Allergic rhinitis    Allergic rhinitis    Arthritis    Basal cell carcinoma 11/01/2019    bcc left chest treatment [place] [id number] [id number]    Chronic leg pain    right   Chronic lower back pain    Coronary artery disease    a. Stenting to RCA 2004; staged DES to LAD and Cx 2004. DES to [facility] 2012. b. DES to mCx, PTCA to dCx 11/2011. c. Lateral wall MI 2013 s/p PTCA to distal Cx & DES to mid OM2 11/2011. d. Low risk nuc 04/2014, EF wnl.   COVID-19    Diabetes mellitus    Insulin dependent   Diabetic neuropathy (HCC)    MILD   Diverticulosis    Dysrhythmia    [name] syndrome    Gout    right wrist; right foot; right elbow; have had it since 1970's   H/O hiatal hernia    Heart murmur    History of echocardiogram    aortic sclerosis per echo 12/09 EF 65%, otherwise normal   History of hemorrhoids    BLEEDING   History of kidney stones    h/o   Hypertension    Diagnosed 1995    Myocardial infarction [facility])    Pancreatic pseudocyst    a. s/p remote drainage 2006.    Thrombocytopenia (HCC)    Seen on oldest labs in system from 2004   Vitamin B 12 deficiency    orally replaced    Medications:  Scheduled:   allopurinol  300 mg Oral Daily   amLODipine  5 mg Oral Daily   aspirin EC  81 mg Oral Daily   insulin aspart  0-5 Units Subcutaneous QHS   insulin aspart  0-9 Units Subcutaneous TID WC   isosorbide mononitrate  60 mg Oral Daily   losartan  25 mg Oral Daily   rosuvastatin  20 mg Oral Daily   sodium chloride flush  3 mL Intravenous Q12H   sodium chloride flush  3 mL Intravenous Q12H   Infusions:   sodium chloride     sodium chloride      Assessment: Keith Espinoza is a 48 YOM who presented with unstable angina. Patient has a history of CAD s/p DES (on aspirin and plavix). Plans for cardiac catheterization per cardiology. Pharmacy consulted to dose IV heparin.  Deemed not a candidate for CABG - interventional options will be discussed as outpatient. Heparin level came back therapeutic at 0.42, on heparin infusion at 1100 units/hr. No  s/sx of bleeding or infusion issues. Plan for possible discharge today.   Goal of Therapy:  Heparin level 0.3-0.7 units/ml Monitor platelets by anticoagulation protocol: Yes   Plan:  Stop heparin infusion  Thank you for allowing pharmacy to participate in this patient's care,  Sherron Monday, PharmD, BCCCP Clinical Pharmacist  Phone: [phone] 04/27/2023 8:30 AM  Please check AMION for all [facility] Pharm[facility] phone numbers After 10:00 PM, call Main Pharmacy [phone]

## 2023-04-27 NOTE — Discharge Summary (Signed)
Physician Discharge Summary  Keith Espinoza ZOX:[id number] DOB: 23-May-1941 DOA: 04/22/2023  PCP: Keith Aspen, MD  Admit date: 04/22/2023 Discharge date: 04/27/2023  Admitted From: Home Disposition: Home  Recommendations for Outpatient Follow-up:  Follow up with PCP in 1-2 weeks Follow-up with cardiology as scheduled  Home Health: None Equipment/Devices: None  Discharge Condition: Stable CODE STATUS: Full Diet recommendation: Low-salt low-fat low-carb diet  Brief/Interim Summary: Keith Espinoza is a 82 y.o. male with medical history significant of CAD s/p stenting RCA 2004 with staged DES-LAD and Cfx 2004, DES RCA 2012, DES mCfx, PTCA dCfx 2013, low risk NST 04/2014, insulin-dependent diabetes mellitus type 2, extensive peripheral vascular disease s/p right BKA and chronic pancreatitis presented to [facility] with complaining of chest pain.  Patient states he took 3 nitro tablets over the previous few hours prior to hospitalization with resolution of chest pain.  Given risk factors patient was admitted to hospital service with consult to cardiology.  Patient admitted as above with acute unstable angina with profound cardiac history.  Patient had extensive workup with cardiology here, cath on 7/22 shows severe multivessel disease with moderate in-stent restenosis of the LAD RCA and left circumflex arteries.  Patient evaluated by cardio thoracic surgery and was deemed not to be candidate for any surgical intervention, as such patient's medical management has been aggressively titrated, new medication list as below.  HCTZ discontinued, carvedilol and rituximab doses change as below..    Discharge Diagnoses:  Principal Problem:   Chest pain of uncertain etiology Active Problems:   Unstable angina (HCC)   History of CAD (coronary artery disease)   Primary hypertension   Type 2 diabetes mellitus with complication, without long-term current use of insulin (HCC)    Hyperlipidemia   Gout   AKI (acute kidney injury) (HCC)   Elevated troponin   Unstable angina  History of CAD heart cath x 6 History of CAD status post post DES stent (currently on aspirin and Plavix) - Cardiology consulted, cardiac catheterization 7/22 - with Severe MV CAD - with moderate in-stent-restenosis in LAD/RCA & LCx   Essential hypertension -Well-controlled  CKD stage IIIa -AKI ruled out - Creatinine 1.35 on presentation -appears to be near baseline around 1.3 over the past 10 months - Continue losartan -hold hydrochlorothiazide   PVD status post right-sided BKA -Reports statin intolerance with simvastatin, no issues with Crestor during hospitalization   Insulin-dependent diabetes mellitus type 2 -A1c 7.1 -Resume home medications, discussed improving dietary and lifestyle choices   Gout - Continue home allopurinol 300 mg daily  Discharge Instructions   Allergies as of 04/27/2023       Reactions   Simvastatin Other (See Comments)   SEVERE MYALGIAS   Zetia [ezetimibe] Other (See Comments)   MYALGIAS   Dilaudid [hydromorphone Hcl] Other (See Comments)   hallucination        Medication List     STOP taking these medications    hydrochlorothiazide 12.5 MG tablet Commonly known as: HYDRODIURIL       TAKE these medications    acetaminophen 500 MG tablet Commonly known as: TYLENOL Take 1,000 mg by mouth every 6 (six) hours as needed (pain).   allopurinol 300 MG tablet Commonly known as: ZYLOPRIM Take 300 mg by mouth daily.   amLODipine 10 MG tablet Commonly known as: NORVASC Take 1 tablet (10 mg total) by mouth daily. Start taking on: April 28, 2023 What changed:  medication strength how much to take   aspirin  EC 81 MG tablet Take 81 mg by mouth daily.   B-12 2500 MCG Tabs Take 2,500 mcg by mouth daily.   clopidogrel 75 MG tablet Commonly known as: PLAVIX Take 1 tablet (75 mg total) by mouth daily.   Dexcom G7 Sensor Misc 1 Device  by Does not apply route as directed.   gabapentin 300 MG capsule Commonly known as: NEURONTIN Take 600 mg by mouth at bedtime. What changed: Another medication with the same name was changed. Make sure you understand how and when to take each.   gabapentin 400 MG capsule Commonly known as: NEURONTIN Take 1 capsule (400 mg total) by mouth 3 (three) times daily. What changed: when to take this   Insulin Pen Needle 32G X 4 MM Misc 1 Device by Does not apply route in the morning, at noon, in the evening, and at bedtime.   isosorbide mononitrate 60 MG 24 hr tablet Commonly known as: IMDUR Take 1 tablet (60 mg total) by mouth daily. Start taking on: April 28, 2023 What changed:  medication strength how much to take additional instructions   Jardiance 25 MG Tabs tablet Generic drug: empagliflozin TAKE 1 TABLET(25 MG) BY MOUTH DAILY   losartan 25 MG tablet Commonly known as: COZAAR Take 1 tablet (25 mg total) by mouth daily. Start taking on: April 28, 2023 What changed:  medication strength how much to take   MAGNESIUM PO Take 1 tablet by mouth at bedtime.   metFORMIN 1000 MG tablet Commonly known as: GLUCOPHAGE Take 1 tablet (1,000 mg total) by mouth 2 (two) times daily.   nitroGLYCERIN 0.4 MG SL tablet Commonly known as: NITROSTAT Place 0.4 mg under the tongue every 5 (five) minutes x 3 doses as needed for chest pain.   NovoLOG FlexPen 100 UNIT/ML FlexPen Generic drug: insulin aspart Max daily 30 units What changed:  how much to take how to take this when to take this additional instructions   pioglitazone 30 MG tablet Commonly known as: ACTOS TAKE 1 TABLET(30 MG) BY MOUTH DAILY   polyethylene glycol 17 g packet Commonly known as: MiraLax Take 17 g by mouth 2 (two) times daily. What changed:  when to take this reasons to take this   potassium chloride SA 20 MEQ tablet Commonly known as: KLOR-CON M Take 1 tablet (20 mEq total) by mouth daily. What changed:  when to take this   QC TUMERIC COMPLEX PO Take 1 capsule by mouth at bedtime.   rosuvastatin 20 MG tablet Commonly known as: CRESTOR Take 1 tablet (20 mg total) by mouth daily. Start taking on: April 28, 2023   senna-docusate 8.6-50 MG tablet Commonly known as: Senokot-S Take 1 tablet by mouth at bedtime. What changed: when to take this   terazosin 5 MG capsule Commonly known as: HYTRIN Take 1 capsule (5 mg total) by mouth at bedtime. What changed: when to take this   [place] FlexTouch 100 UNIT/ML FlexTouch Pen Generic drug: insulin degludec Inject 12 Units into the skin daily. What changed: how much to take   VITAMIN C PO Take 1 tablet by mouth at bedtime.   Vitamin D3 50 MCG (2000 UT) Tabs Take 2,000 Units by mouth at bedtime.        Follow-up Information     Corky Crafts, MD Follow up on 05/04/2023.   Specialties: Cardiology, Radiology, Interventional Cardiology Why: at 9:45 am for your follow up appt Contact information: [address] [place] [address] [phone]  Allergies  Allergen Reactions   Simvastatin Other (See Comments)    SEVERE MYALGIAS    Zetia [Ezetimibe] Other (See Comments)    MYALGIAS   Dilaudid [Hydromorphone Hcl] Other (See Comments)    hallucination    Consultations: Cardiology, cardiothoracic surgery  Procedures/Studies: CARDIAC CATHETERIZATION  Result Date: 04/25/2023   Mid LAD-1 lesion is 75% stenosed with 70% stenosed side branch in 1st Diag. - just prior to stent.  Mid LAD-2 STENT is 40% stenosed.  Mid LAD-3 lesion is 75% stenosed with 60% stenosed side branch in 2nd Diag at distal edge of STENT   1st Diag lesion is 90% stenosed.   Prox Cx to Mid Cx STENT segment is 45% stenosed with 90% stenosed side branch in 4th Mrg.  Mid Cx to Dist Cx lesion is 95% stenosed just after stent   Dist Cx-1 lesion is 95% stenosed prior to stent. Previously placed Dist Cx-2 DES STENT is widely  patent.   Prox RCA-1 lesion is 70% stenosed.  Prox RCA-2 lesion is 85% stenosed. Prox RCA-3 lesion is 80% stenosed. Prox RCA to Mid RCA lesion is 95% stenosed.   Mid RCA STENT is 35% stenosed.   RPAV lesion is 60% stenosed.   RPDA lesion is 80% stenosed.   There is no aortic valve stenosis.  Severe MV CAD - with moderate ISR in LAD/RCA & LCx but each stent with stent edge lesions.  Best option is CABG -- RECOMMENDATIONS   CVTS Consultation   Recommend Aspirin 81mg  daily for moderate CAD.   D/c Plavix for planned CVTS consult; Have ordered IV Heparin 2 hr post TR Band off   ECHOCARDIOGRAM COMPLETE  Result Date: 04/24/2023    ECHOCARDIOGRAM REPORT   Patient Name:   Keith Espinoza Date of Exam: 04/24/2023 Medical Rec #:  [id number]      Height:       70.0 in Accession #:    [id number]     Weight:       153.2 lb Date of Birth:  November 22, 1940      BSA:          1.864 m Patient Age:    81 years       BP:           127/64 mmHg Patient Gender: M              HR:           54 bpm. Exam Location:  Inpatient Procedure: 2D Echo, Cardiac Doppler and Color Doppler Indications:    Aortic stenosis I35.0  History:        Patient has no prior history of Echocardiogram examinations.                 CAD, Aortic Valve Disease; Risk Factors:Hypertension, Diabetes,                 Dyslipidemia and Former Smoker.  Sonographer:    Dondra Prader RVT RCS Referring Phys: [id number] BRIAN S CRENSHAW IMPRESSIONS  1. Left ventricular ejection fraction, by estimation, is 60 to 65%. The left ventricle has normal function. The left ventricle has no regional wall motion abnormalities. There is mild left ventricular hypertrophy. Left ventricular diastolic parameters are consistent with Grade I diastolic dysfunction (impaired relaxation). Elevated left atrial pressure.  2. Right ventricular systolic function is normal. The right ventricular size is normal.  3. Left atrial size was mildly dilated.  4. Trivial mitral valve regurgitation.  5. AV is thickened,  calcified ? functionally bicuspid with fusion of L and noncoronary cusps. Peak and mean gradients through the valve are 26 and 15 mm Hg respectively. AVA (VTI ) is 1.16 cm2. Dimensionless index is 0.44. Overall ocnsistent with mild to moderate AS. Marland Kitchen Aortic valve regurgitation is not visualized. FINDINGS  Left Ventricle: Left ventricular ejection fraction, by estimation, is 60 to 65%. The left ventricle has normal function. The left ventricle has no regional wall motion abnormalities. The left ventricular internal cavity size was normal in size. There is  mild left ventricular hypertrophy. Left ventricular diastolic parameters are consistent with Grade I diastolic dysfunction (impaired relaxation). Elevated left atrial pressure. Right Ventricle: The right ventricular size is normal. Right vetricular wall thickness was not well visualized. Right ventricular systolic function is normal. Left Atrium: Left atrial size was mildly dilated. Right Atrium: Right atrial size was normal in size. Pericardium: There is no evidence of pericardial effusion. Mitral Valve: There is mild thickening of the mitral valve leaflet(s). Trivial mitral valve regurgitation. Tricuspid Valve: The tricuspid valve is normal in structure. Tricuspid valve regurgitation is trivial. Aortic Valve: AV is thickened, calcified ? functionally bicuspid with fusion of L and noncoronary cusps. Peak and mean gradients through the valve are 26 and 15 mm Hg respectively. AVA (VTI ) is 1.16 cm2. Dimensionless index is 0.44. Overall ocnsistent with mild to moderate AS. Aortic valve regurgitation is not visualized. Aortic valve mean gradient measures 16.0 mmHg. Aortic valve peak gradient measures 27.6 mmHg. Aortic valve area, by VTI measures 1.11 cm. Pulmonic Valve: The pulmonic valve was grossly normal. Pulmonic valve regurgitation is not visualized. Aorta: The aortic root and ascending aorta are structurally normal, with no evidence of dilitation. IAS/Shunts: No  atrial level shunt detected by color flow Doppler.  LEFT VENTRICLE PLAX 2D LVIDd:         4.40 cm   Diastology LVIDs:         2.70 cm   LV e' medial:    4.65 cm/s LV PW:         1.30 cm   LV E/e' medial:  22.2 LV IVS:        1.10 cm   LV e' lateral:   7.43 cm/s LVOT diam:     1.80 cm   LV E/e' lateral: 13.9 LV SV:         75 LV SV Index:   40 LVOT Area:     2.54 cm  RIGHT VENTRICLE             IVC RV Basal diam:  3.30 cm     IVC diam: 2.00 cm RV S prime:     12.40 cm/s TAPSE (M-mode): 2.3 cm LEFT ATRIUM             Index        RIGHT ATRIUM           Index LA diam:        3.70 cm 1.99 cm/m   RA Area:     14.90 cm LA Vol (A2C):   77.0 ml 41.31 ml/m  RA Volume:   35.10 ml  18.83 ml/m LA Vol (A4C):   60.7 ml 32.57 ml/m LA Biplane Vol: 73.7 ml 39.54 ml/m  AORTIC VALVE                     PULMONIC VALVE AV Area (Vmax):    1.10 cm      PV Vmax:  0.95 m/s AV Area (Vmean):   1.06 cm      PV Peak grad:  3.6 mmHg AV Area (VTI):     1.11 cm AV Vmax:           262.50 cm/s AV Vmean:          190.500 cm/s AV VTI:            0.670 m AV Peak Grad:      27.6 mmHg AV Mean Grad:      16.0 mmHg LVOT Vmax:         113.00 cm/s LVOT Vmean:        79.700 cm/s LVOT VTI:          0.293 m LVOT/AV VTI ratio: 0.44  AORTA Ao Root diam: 2.90 cm Ao Asc diam:  3.60 cm MITRAL VALVE                TRICUSPID VALVE MV Area (PHT): 2.54 cm     TR Peak grad:   14.9 mmHg MV Decel Time: 299 msec     TR Vmax:        193.00 cm/s MV E velocity: 103.00 cm/s MV A velocity: 125.00 cm/s  SHUNTS MV E/A ratio:  0.82         Systemic VTI:  0.29 m                             Systemic Diam: 1.80 cm Dietrich Pates MD Electronically signed by Dietrich Pates MD Signature Date/Time: 04/24/2023/4:41:23 PM    Final    DG Chest Port 1 View  Result Date: 04/22/2023 CLINICAL DATA:  Chest pain. EXAM: PORTABLE CHEST 1 VIEW COMPARISON:  01/30/2021. FINDINGS: The heart size and mediastinal contours are within normal limits. There is atherosclerotic calcification of the  aorta. No consolidation, effusion, or pneumothorax. No acute osseous abnormality. Surgical clips are present in the right upper quadrant. IMPRESSION: No active disease. Electronically Signed   By: Thornell Sartorius M.D.   On: 04/22/2023 23:24     Subjective: No acute issues or events overnight denies nausea vomiting diarrhea constipation any fevers chills or chest pain   Discharge Exam: Vitals:   04/27/23 0700 04/27/23 1203  BP: (!) 158/78 (!) 164/71  Pulse: (!) 57   Resp:    Temp: 98.1 F (36.7 C) 98.1 F (36.7 C)  SpO2: 95%    Vitals:   04/27/23 0056 04/27/23 0341 04/27/23 0700 04/27/23 1203  BP: (!) 160/66 (!) 157/65 (!) 158/78 (!) 164/71  Pulse: 67 (!) 57 (!) 57   Resp:      Temp:  98.9 F (37.2 C) 98.1 F (36.7 C) 98.1 F (36.7 C)  TempSrc:  Oral Oral Oral  SpO2: 94% 95% 95%   Weight:  67.7 kg    Height:        General: Pt is alert, awake, not in acute distress Cardiovascular: RRR, S1/S2 +, no rubs, no gallops Respiratory: CTA bilaterally, no wheezing, no rhonchi Abdominal: Soft, NT, ND, bowel sounds + Extremities: no edema, no cyanosis, right BKA/prosthetic in place   The results of significant diagnostics from this hospitalization (including imaging, microbiology, ancillary and laboratory) are listed below for reference.    Labs: Basic Metabolic Panel: Recent Labs  Lab 04/22/23 2256 04/24/23 0233 04/25/23 0441 04/26/23 0622 04/27/23 0403  NA 141 139 139 138 140  K 3.7 3.2* 3.6 3.2* 3.6  CL 102 104  103 105 106  CO2 28 26 26 24 22   GLUCOSE 304* 248* 214* 217* 185*  BUN 28* 29* 29* 25* 22  CREATININE 1.35* 1.39* 1.40* 1.19 1.21  CALCIUM 10.0 9.3 9.3 9.3 9.4  MG  --   --   --   --  1.7   Liver Function Tests: Recent Labs  Lab 04/24/23 0233  AST 14*  ALT 11  ALKPHOS 61  BILITOT 0.1*  PROT 6.2*  ALBUMIN 3.4*   No results for input(s): "LIPASE", "AMYLASE" in the last 168 hours. No results for input(s): "AMMONIA" in the last 168 hours. CBC: Recent  Labs  Lab 04/22/23 2256 04/24/23 0233 04/25/23 0441 04/26/23 0622 04/27/23 0403  WBC 4.1 5.3 5.7 5.3 5.1  HGB 10.7* 9.9* 10.0* 9.7* 9.5*  HCT 32.2* 30.9* 31.0* 30.1* 29.0*  MCV 92.8 92.5 94.5 91.8 90.3  PLT 114* 114* 116* 122* 117*   Cardiac Enzymes: No results for input(s): "CKTOTAL", "CKMB", "CKMBINDEX", "TROPONINI" in the last 168 hours. BNP: Invalid input(s): "POCBNP" CBG: Recent Labs  Lab 04/26/23 1307 04/26/23 1715 04/26/23 2134 04/27/23 0819 04/27/23 1218  GLUCAP 259* 172* 215* 355* 219*   D-Dimer No results for input(s): "DDIMER" in the last 72 hours. Hgb A1c No results for input(s): "HGBA1C" in the last 72 hours. Lipid Profile Recent Labs    04/25/23 2341  CHOL 154  HDL 57  LDLCALC 87  TRIG 52  CHOLHDL 2.7   Thyroid function studies No results for input(s): "TSH", "T4TOTAL", "T3FREE", "THYROIDAB" in the last 72 hours.  Invalid input(s): "FREET3" Anemia work up No results for input(s): "VITAMINB12", "FOLATE", "FERRITIN", "TIBC", "IRON", "RETICCTPCT" in the last 72 hours. Urinalysis    Component Value Date/Time   COLORURINE COLORLESS (A) 04/23/2023 0056   APPEARANCEUR CLEAR 04/23/2023 0056   LABSPEC 1.023 04/23/2023 0056   PHURINE 6.5 04/23/2023 0056   GLUCOSEU >1,000 (A) 04/23/2023 0056   HGBUR NEGATIVE 04/23/2023 0056   BILIRUBINUR NEGATIVE 04/23/2023 0056   KETONESUR NEGATIVE 04/23/2023 0056   PROTEINUR NEGATIVE 04/23/2023 0056   NITRITE NEGATIVE 04/23/2023 0056   LEUKOCYTESUR NEGATIVE 04/23/2023 0056   Sepsis Labs Recent Labs  Lab 04/24/23 0233 04/25/23 0441 04/26/23 0622 04/27/23 0403  WBC 5.3 5.7 5.3 5.1   Microbiology No results found for this or any previous visit (from the past 240 hour(s)).   Time coordinating discharge: Over 30 minutes  SIGNED:   Azucena Fallen, DO [facility] 04/27/2023, 2:48 PM Pager   If 7PM-7AM, please contact night-coverage [email or web address]

## 2023-04-27 NOTE — Progress Notes (Signed)
PT being discharged, VSS, IV removed, Education complete.   Balinda Quails, RN 04/27/2023 2:39 PM

## 2023-04-27 NOTE — Inpatient Diabetes Management (Signed)
Inpatient Diabetes Program Recommendations  AACE/ADA: New Consensus Statement on Inpatient Glycemic Control (2015)  Target Ranges:  Prepandial:   less than 140 mg/dL      Peak postprandial:   less than 180 mg/dL (1-2 hours)      Critically ill patients:  140 - 180 mg/dL   Lab Results  Component Value Date   GLUCAP 355 (H) 04/27/2023   HGBA1C 7.1 (A) 04/01/2023    Review of Glycemic Control  Latest Reference Range & Units 04/26/23 08:49 04/26/23 10:08 04/26/23 13:07 04/26/23 17:15 04/26/23 21:34 04/27/23 08:19  Glucose-Capillary 70 - 99 mg/dL 161 (H) 096 (H) 045 (H) 172 (H) 215 (H) 355 (H)  (H): Data is abnormally high  Diabetes history: DM2 Outpatient Diabetes medications:  Dexcom G7 CGM Jardiance 25 mg Daily Novolog 2-5 units Daily per SSI Tresiba 10 units Daily Metformin 1000 mg BID Actos 30 mg Daily Current orders for Inpatient glycemic control:  Novolog 0-9 units TID and 0-5 units QHS  Inpatient Diabetes Program Recommendations:    Please consider:  1-Semglee 10 units every day 2-Novolog 3 units TID with meals if he consumes at least 50% 3-Add carb modified to diet  Will continue to follow while inpatient.  Thank you, Dulce Sellar, MSN, CDCES Diabetes Coordinator Inpatient Diabetes Program [phone] (team pager from 8a-5p)

## 2023-04-28 LAB — LIPOPROTEIN A (LPA): Lipoprotein (a): 16.1 nmol/L (ref <75.0)

## 2023-05-03 NOTE — Progress Notes (Unsigned)
Cardiology Office Note   Date:  05/04/2023   ID:  Keith, Espinoza 09/30/1941, MRN [id number]  PCP:  Emilio Aspen, MD    No chief complaint on file.  CAD  Wt Readings from Last 3 Encounters:  05/04/23 164 lb (74.4 kg)  04/27/23 149 lb 4 oz (67.7 kg)  04/01/23 161 lb (73 kg)       History of Present Illness: Keith Espinoza is a 82 y.o. male  with history of CAD S/P stenting RCA 2004 with staged DES-LAD and Cfx 2004, DES RCA 2012, DES mCfx, PTCA dCfx 2013, low risk NST 04/2014.   He was advised to quit chewing tobacco.     He had COVID in Jan 2022.  No hospital stay required.     He had a leg amputation in 12/27/22. His wife, Keith Espinoza, died in 01/27/23.  She did not tolerate dialysis to ventricular tach.     Received prosthetic leg.    Cath for unstable angina in 2024 showed: "Mid LAD-1 lesion is 75% stenosed with 70% stenosed side branch in 1st Diag. - just prior to stent.  Mid LAD-2 STENT is 40% stenosed.  Mid LAD-3 lesion is 75% stenosed with 60% stenosed side branch in 2nd Diag at distal edge of STENT   1st Diag lesion is 90% stenosed.   Prox Cx to Mid Cx STENT segment is 45% stenosed with 90% stenosed side branch in 4th Mrg.  Mid Cx to Dist Cx lesion is 95% stenosed just after stent   Dist Cx-1 lesion is 95% stenosed prior to stent. Previously placed Dist Cx-2 DES STENT is widely patent.   Prox RCA-1 lesion is 70% stenosed.  Prox RCA-2 lesion is 85% stenosed. Prox RCA-3 lesion is 80% stenosed. Prox RCA to Mid RCA lesion is 95% stenosed.   Mid RCA STENT is 35% stenosed.   RPAV lesion is 60% stenosed.   RPDA lesion is 80% stenosed.   There is no aortic valve stenosis.    Severe MV CAD - with moderate ISR in LAD/RCA & LCx but each stent with stent edge lesions.  "  He was evaluated for surgery but turned down.  He is here to discuss further options.  Past Medical History:  Diagnosis Date   Allergic rhinitis    Allergic rhinitis    Arthritis     Basal cell carcinoma 11/01/2019    bcc left chest treatment [place] cx3 [id number]    Chronic leg pain    right   Chronic lower back pain    Coronary artery disease    a. Stenting to RCA 2004; staged DES to LAD and Cx 2004. DES to [facility] 2012. b. DES to mCx, PTCA to dCx 11/2011. c. Lateral wall MI 2013 s/p PTCA to distal Cx & DES to mid OM2 11/2011. d. Low risk nuc 04/2014, EF wnl.   COVID-19    Diabetes mellitus    Insulin dependent   Diabetic neuropathy (HCC)    MILD   Diverticulosis    Dysrhythmia    [name] syndrome    Gout    right wrist; right foot; right elbow; have had it since 1970's   H/O hiatal hernia    Heart murmur    History of echocardiogram    aortic sclerosis per echo 12/09 EF 65%, otherwise normal   History of hemorrhoids    BLEEDING   History of kidney stones    h/o   Hypertension    Diagnosed  1995    Myocardial infarction [facility])    Pancreatic pseudocyst    a. s/p remote drainage 2006.   Thrombocytopenia (HCC)    Seen on oldest labs in system from 2004   Vitamin B 12 deficiency    orally replaced    Past Surgical History:  Procedure Laterality Date   ABDOMINAL AORTOGRAM W/LOWER EXTREMITY Bilateral 08/08/2020   Procedure: ABDOMINAL AORTOGRAM W/LOWER EXTREMITY;  Surgeon: Chuck Hint, MD;  Location: [facility] INVASIVE CV LAB;  Service: Cardiovascular;  Laterality: Bilateral;   AMPUTATION Right 12/12/2020   Procedure: RIGHT BELOW KNEE AMPUTATION;  Surgeon: Nadara Mustard, MD;  Location: [facility] OR;  Service: Orthopedics;  Laterality: Right;   BACK SURGERY     "total of 3 times" S/P fall    CARPAL TUNNEL RELEASE Bilateral    CHOLECYSTECTOMY  1990's   COLONOSCOPY     CORONARY ANGIOPLASTY  11/11/11   CORONARY ANGIOPLASTY WITH STENT PLACEMENT  09/30/2011   "1 then; makes a total of 4"   CORONARY ANGIOPLASTY WITH STENT PLACEMENT  11/11/11   "1; makes a total of 5"   INGUINAL HERNIA REPAIR  2003   right   JOINT REPLACEMENT Right 04/03/2002   hip replacment   KNEE ARTHROSCOPY   1990's   left   LEFT HEART CATH AND CORONARY ANGIOGRAPHY N/A 04/25/2023   Procedure: LEFT HEART CATH AND CORONARY ANGIOGRAPHY;  Surgeon: Marykay Lex, MD;  Location: [facility] INVASIVE CV LAB;  Service: Cardiovascular;  Laterality: N/A;   LEFT HEART CATHETERIZATION WITH CORONARY ANGIOGRAM N/A 09/30/2011   Procedure: LEFT HEART CATHETERIZATION WITH CORONARY ANGIOGRAM;  Surgeon: Corky Crafts, MD;  Location: [facility] CATH LAB;  Service: Cardiovascular;  Laterality: N/A;  possible PCI   LEFT HEART CATHETERIZATION WITH CORONARY ANGIOGRAM N/A 11/15/2011   Procedure: LEFT HEART CATHETERIZATION WITH CORONARY ANGIOGRAM;  Surgeon: Corky Crafts, MD;  Location: [facility] CATH LAB;  Service: Cardiovascular;  Laterality: N/A;   PERCUTANEOUS CORONARY STENT INTERVENTION (PCI-S)  09/30/2011   Procedure: PERCUTANEOUS CORONARY STENT INTERVENTION (PCI-S);  Surgeon: Corky Crafts, MD;  Location: [facility] CATH LAB;  Service: Cardiovascular;;   PERCUTANEOUS CORONARY STENT INTERVENTION (PCI-S) N/A 11/11/2011   Procedure: PERCUTANEOUS CORONARY STENT INTERVENTION (PCI-S);  Surgeon: Corky Crafts, MD;  Location: [facility] CATH LAB;  Service: Cardiovascular;  Laterality: N/A;   PERIPHERAL VASCULAR BALLOON ANGIOPLASTY Right 08/08/2020   Procedure: PERIPHERAL VASCULAR BALLOON ANGIOPLASTY;  Surgeon: Chuck Hint, MD;  Location: [facility] INVASIVE CV LAB;  Service: Cardiovascular;  Laterality: Right;  Posterior tibial    SHOULDER SURGERY Right    X 2   STUMP REVISION Right 01/09/2021   Procedure: REVISION RIGHT BELOW KNEE AMPUTATION;  Surgeon: Nadara Mustard, MD;  Location: [facility] OR;  Service: Orthopedics;  Laterality: Right;   TONSILLECTOMY  ~ 1948   TOTAL HIP REVISION Right 04/13/2019   Procedure: RIGHT TOTAL HIP REVISION-POSTERIOR  APPROACH LATERAL;  Surgeon: Eldred Manges, MD;  Location: [facility] OR;  Service: Orthopedics;  Laterality: Right;   TOTAL KNEE ARTHROPLASTY Left 07/23/2016   Procedure: LEFT TOTAL KNEE ARTHROPLASTY;  Surgeon:  Eldred Manges, MD;  Location: [facility] OR;  Service: Orthopedics;  Laterality: Left;     Current Outpatient Medications  Medication Sig Dispense Refill   acetaminophen (TYLENOL) 500 MG tablet Take 1,000 mg by mouth every 6 (six) hours as needed (pain).     allopurinol (ZYLOPRIM) 300 MG tablet Take 300 mg by mouth daily.     amLODipine (NORVASC) 10 MG tablet Take  1 tablet (10 mg total) by mouth daily. 30 tablet 0   Ascorbic Acid (VITAMIN C PO) Take 1 tablet by mouth at bedtime.     aspirin EC 81 MG tablet Take 81 mg by mouth daily.     Cholecalciferol (VITAMIN D3) 50 MCG (2000 UT) TABS Take 2,000 Units by mouth at bedtime.     clopidogrel (PLAVIX) 75 MG tablet Take 1 tablet (75 mg total) by mouth daily. 30 tablet 0   Continuous Blood Gluc Sensor (DEXCOM G7 SENSOR) MISC 1 Device by Does not apply route as directed. 9 each 3   Cyanocobalamin (B-12) 2500 MCG TABS Take 2,500 mcg by mouth daily.     empagliflozin (JARDIANCE) 25 MG TABS tablet TAKE 1 TABLET(25 MG) BY MOUTH DAILY 90 tablet 0   gabapentin (NEURONTIN) 300 MG capsule Take 600 mg by mouth at bedtime.     gabapentin (NEURONTIN) 400 MG capsule Take 1 capsule (400 mg total) by mouth 3 (three) times daily. (Patient taking differently: Take 400 mg by mouth daily.) 90 capsule 0   insulin aspart (NOVOLOG FLEXPEN) 100 UNIT/ML FlexPen Max daily 30 units (Patient taking differently: Inject 2-5 Units into the skin daily. Per sliding scale) 30 mL 3   insulin degludec (TRESIBA FLEXTOUCH) 100 UNIT/ML FlexTouch Pen Inject 12 Units into the skin daily. (Patient taking differently: Inject 10 Units into the skin daily.) 15 mL 3   Insulin Pen Needle 32G X 4 MM MISC 1 Device by Does not apply route in the morning, at noon, in the evening, and at bedtime. 400 each 3   isosorbide mononitrate (IMDUR) 60 MG 24 hr tablet Take 1 tablet (60 mg total) by mouth daily. 30 tablet 0   losartan (COZAAR) 25 MG tablet Take 1 tablet (25 mg total) by mouth daily. 30 tablet 0    MAGNESIUM PO Take 1 tablet by mouth at bedtime.     metFORMIN (GLUCOPHAGE) 1000 MG tablet Take 1 tablet (1,000 mg total) by mouth 2 (two) times daily. 180 tablet 3   nitroGLYCERIN (NITROSTAT) 0.4 MG SL tablet Place 0.4 mg under the tongue every 5 (five) minutes x 3 doses as needed for chest pain.      pioglitazone (ACTOS) 30 MG tablet TAKE 1 TABLET(30 MG) BY MOUTH DAILY 90 tablet 3   polyethylene glycol (MIRALAX) 17 g packet Take 17 g by mouth 2 (two) times daily. (Patient taking differently: Take 17 g by mouth daily as needed for mild constipation or moderate constipation.) 28 each 3   potassium chloride SA (KLOR-CON) 20 MEQ tablet Take 1 tablet (20 mEq total) by mouth daily. (Patient taking differently: Take 20 mEq by mouth at bedtime.) 20 tablet 0   rosuvastatin (CRESTOR) 20 MG tablet Take 1 tablet (20 mg total) by mouth daily. 30 tablet 0   senna-docusate (SENOKOT-S) 8.6-50 MG tablet Take 1 tablet by mouth at bedtime. (Patient taking differently: Take 1 tablet by mouth 2 (two) times daily.) 30 tablet 2   terazosin (HYTRIN) 5 MG capsule Take 1 capsule (5 mg total) by mouth at bedtime. (Patient taking differently: Take 5 mg by mouth 2 (two) times daily.) 30 capsule 0   Turmeric (QC TUMERIC COMPLEX PO) Take 1 capsule by mouth at bedtime.     No current facility-administered medications for this visit.    Allergies:   Simvastatin, Zetia [ezetimibe], and Dilaudid [hydromorphone hcl]    Social History:  The patient  reports that he has quit smoking. His smokeless tobacco use  includes chew. He reports that he does not drink alcohol and does not use drugs.   Family History:  The patient's family history includes Cancer in his brother and father; Diabetes in his mother and sister; Hyperlipidemia in his mother; Hypertension in his father, mother, and sister.    ROS:  Please see the history of present illness.   Otherwise, review of systems are positive for displeased with hospital stay.   All other  systems are reviewed and negative.    PHYSICAL EXAM: VS:  BP (!) 116/58 (BP Location: Left Arm, Patient Position: Sitting, Cuff Size: Normal)   Pulse 92   Ht 5\' 10"  (1.778 m)   Wt 164 lb (74.4 kg)   SpO2 97%   BMI 23.53 kg/m  , BMI Body mass index is 23.53 kg/m. GEN: Well nourished, well developed, in no acute distress HEENT: normal Neck: no JVD, carotid bruits, or masses Cardiac: RRR; no murmurs, rubs, or gallops,no edema  Respiratory:  clear to auscultation bilaterally, normal work of breathing GI: soft, nontender, nondistended, + BS MS: no deformity or atrophy; right leg amputation Skin: warm and dry, no rash Neuro:  Strength and sensation are intact Psych: euthymic mood, full affect    Recent Labs: 04/24/2023: ALT 11 04/27/2023: BUN 22; Creatinine, Ser 1.21; Hemoglobin 9.5; Magnesium 1.7; Platelets 117; Potassium 3.6; Sodium 140   Lipid Panel    Component Value Date/Time   CHOL 154 04/25/2023 2341   TRIG 52 04/25/2023 2341   HDL 57 04/25/2023 2341   CHOLHDL 2.7 04/25/2023 2341   VLDL 10 04/25/2023 2341   LDLCALC 87 04/25/2023 2341     Other studies Reviewed: Additional studies/ records that were reviewed today with results demonstrating: labs reviewed.   ASSESSMENT AND PLAN:  CAD: Angina controlled on medical therapy.  We discussed his Findings at length.  There are no straightforward options for PCI.  His LV function is normal.  His angina is well-controlled.  His biggest trigger for chest discomfort is emotional stress.  He is able to get around his house without difficulty.  Will continue medical therapy.  He I agree that he is not a candidate for bypass surgery. Diabetes: A1C 7.1.  Sugars were poorly controlled after returning home from the hospital.  They are improving now that he is back on his usual regimen. Hypertension: The current medical regimen is effective;  continue present plan and medications.  He has occasional low blood pressure readings when he  gets dehydrated.  I encouraged him to drink enough water to stay hydrated. Hyperlipidemia: Continue rosuvastatin.  LDL 87 in July 2024.   Current medicines are reviewed at length with the patient today.  The patient concerns regarding his medicines were addressed.  The following changes have been made:  No change  Labs/ tests ordered today include:  No orders of the defined types were placed in this encounter.   Recommend 150 minutes/week of aerobic exercise Low fat, low carb, high fiber diet recommended  Disposition:   FU in 6 months   Signed, Lance Muss, MD  05/04/2023 10:09 AM    [facility] [address] [place], [place], [place]  [address] Phone: [phone]; Fax: [phone]

## 2023-05-04 ENCOUNTER — Ambulatory Visit: Payer: Medicare Other | Admitting: Interventional Cardiology

## 2023-05-04 VITALS — BP 116/58 | HR 92 | Ht 70.0 in | Wt 164.0 lb

## 2023-05-04 DIAGNOSIS — I25118 Atherosclerotic heart disease of native coronary artery with other forms of angina pectoris: Secondary | ICD-10-CM | POA: Diagnosis not present

## 2023-05-04 DIAGNOSIS — I10 Essential (primary) hypertension: Secondary | ICD-10-CM | POA: Diagnosis not present

## 2023-05-04 DIAGNOSIS — E782 Mixed hyperlipidemia: Secondary | ICD-10-CM | POA: Insufficient documentation

## 2023-05-04 DIAGNOSIS — I739 Peripheral vascular disease, unspecified: Secondary | ICD-10-CM | POA: Diagnosis not present

## 2023-05-04 NOTE — Patient Instructions (Signed)
Medication Instructions:  Your physician recommends that you continue on your current medications as directed. Please refer to the Current Medication list given to you today.  *If you need a refill on your cardiac medications before your next appointment, please call your pharmacy*   Lab Work: none If you have labs (blood work) drawn today and your tests are completely normal, you will receive your results only by: MyChart Message (if you have MyChart) OR A paper copy in the mail If you have any lab test that is abnormal or we need to change your treatment, we will call you to review the results.   Testing/Procedures: none   Follow-Up: At [facility], you and your health needs are our priority.  As part of our continuing mission to provide you with exceptional heart care, we have created designated Provider Care Teams.  These Care Teams include your primary Cardiologist (physician) and Advanced Practice Providers (APPs -  Physician Assistants and Nurse Practitioners) who all work together to provide you with the care you need, when you need it.  We recommend signing up for the patient portal called "MyChart".  Sign up information is provided on this After Visit Summary.  MyChart is used to connect with patients for Virtual Visits (Telemedicine).  Patients are able to view lab/test results, encounter notes, upcoming appointments, etc.  Non-urgent messages can be sent to your provider as well.   To learn more about what you can do with MyChart, go to [email or web address].    Your next appointment:   6 month(s)  Provider:   Jayadeep Varanasi, MD     Other Instructions    

## 2023-05-05 ENCOUNTER — Encounter: Payer: Self-pay | Admitting: Orthopaedic Surgery

## 2023-05-05 ENCOUNTER — Ambulatory Visit (INDEPENDENT_AMBULATORY_CARE_PROVIDER_SITE_OTHER): Payer: Medicare Other | Admitting: Orthopaedic Surgery

## 2023-05-05 VITALS — Ht 70.0 in | Wt 164.0 lb

## 2023-05-05 DIAGNOSIS — R200 Anesthesia of skin: Secondary | ICD-10-CM | POA: Diagnosis not present

## 2023-05-05 NOTE — Progress Notes (Signed)
Office Visit Note   Patient: Keith Espinoza           Date of Birth: 1941-04-16           MRN: [id number] Visit Date: 05/05/2023              Requested by: Emilio Aspen, MD [address] [place],  [place] [address] PCP: Emilio Aspen, MD   Assessment & Plan: Visit Diagnoses:  1. Finger numbness     Plan: Patient may have had unrecognized contusion in the transposed ulnar nerve on the left with some numbness.  He does have intact motor.  No subluxation of the ulnar nerve.  He can follow-up he has progressive symptoms.  Follow-Up Instructions: No follow-ups on file.   Orders:  No orders of the defined types were placed in this encounter.  No orders of the defined types were placed in this encounter.     Procedures: No procedures performed   Clinical Data: No additional findings.   Subjective: Chief Complaint  Patient presents with   Left Hand - Numbness    HPI 82 year old male woke up had some left fifth finger numbness.  This occurred 4 days ago.  He also noticed some numbness in his entire hand he said carpal tunnel release also ulnar transposition at the elbow.  He does not recall any trauma but does have ecchymosis just anterior to the medial upper condyle.  Patient has diabetes last A1c 7.1 he is on insulin.  No instability of his hip since his last surgery.  Coronary artery disease heart murmur he has no acute chest pain has decreased energy.  Previous right BKA with prosthesis.  Review of Systems all systems noncontributory to HPI.   Objective: Vital Signs: Ht 5\' 10"  (1.778 m)   Wt 164 lb (74.4 kg)   BMI 23.53 kg/m   Physical Exam Constitutional:      Appearance: He is well-developed.  HENT:     Head: Normocephalic and atraumatic.     Right Ear: External ear normal.     Left Ear: External ear normal.  Eyes:     Pupils: Pupils are equal, round, and reactive to light.  Neck:     Thyroid: No thyromegaly.      Trachea: No tracheal deviation.  Cardiovascular:     Rate and Rhythm: Normal rate.  Pulmonary:     Effort: Pulmonary effort is normal.     Breath sounds: No wheezing.  Abdominal:     General: Bowel sounds are normal.     Palpations: Abdomen is soft.  Musculoskeletal:     Cervical back: Neck supple.  Skin:    General: Skin is warm and dry.     Capillary Refill: Capillary refill takes less than 2 seconds.  Neurological:     Mental Status: He is alert and oriented to person, place, and time.  Psychiatric:        Behavior: Behavior normal.        Thought Content: Thought content normal.        Judgment: Judgment normal.     Ortho Exam patient has transposed ulnar nerve in the left arm.  The cubital tunnel.  Positive Tinel's over the transposed nerve with flexion extension there is no subluxation of the nerve.  She interossei with finger abduction is strong and symmetrical.  Healed carpal tunnel.  Mild Dupuytren's palpable over the palm in line with the ring finger.  No triggering noted  Digits.  No brachial plexus tenderness.  Specialty Comments:  No specialty comments available.  Imaging: No results found.   PMFS History: Patient Active Problem List   Diagnosis Date Noted   Finger numbness 05/05/2023   Elevated troponin 04/25/2023   Chest pain of uncertain etiology 04/23/2023   Unstable angina (HCC) 04/23/2023   AKI (acute kidney injury) (HCC) 04/23/2023   History of CAD (coronary artery disease) 04/23/2023   Contusion of right hip 03/08/2023   Type 2 diabetes mellitus with hyperglycemia, with long-term current use of insulin (HCC) 02/15/2022   Adenoma of left adrenal gland 02/15/2022   History of right below knee amputation (HCC) 10/06/2021   Exudative age-related macular degeneration of left eye with active choroidal neovascularization (HCC) 07/28/2021   Exudative age-related macular degeneration of right eye with active choroidal neovascularization (HCC) 06/01/2021    Intermediate stage nonexudative age-related macular degeneration of both eyes 06/01/2021   Type 2 diabetes mellitus with diabetic polyneuropathy, with long-term current use of insulin (HCC) 03/24/2021   Hypokalemia    Constipation 02/02/2021   Fecal impaction (HCC) 02/01/2021   Wound dehiscence 01/09/2021   Dehiscence of amputation stump (HCC)    Status post percutaneous transluminal coronary angioplasty 01/06/2021   Type II diabetes mellitus, uncontrolled 01/06/2021   Acute pancreatitis 01/06/2021   Diabetic peripheral vascular disease (HCC) 01/06/2021   Encounter for screening for other disorder 01/06/2021   Enlarged prostate 01/06/2021   Foot ulcer, right (HCC) 01/06/2021   Gout 01/06/2021   Headache 01/06/2021   Neck pain 01/06/2021   Hypoglycemia 01/06/2021   Loss of appetite 01/06/2021   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   Peripheral neuropathy 01/06/2021   Sciatica 01/06/2021   Vitamin B12 deficiency 01/06/2021   Vitamin D deficiency 01/06/2021   Weakness 01/06/2021   Diabetes mellitus type 2 with neurological manifestations (HCC) 01/06/2021   Protein-calorie malnutrition, severe 12/26/2020   Acute blood loss anemia 12/26/2020   Prerenal azotemia 12/26/2020   Below-knee amputation of right lower extremity (HCC) 12/12/2020   Gangrene of right foot (HCC)    Diabetic neuropathy (HCC) 11/17/2020   Hyperglycemia due to type 2 diabetes mellitus (HCC) 11/17/2020   Long term (current) use of insulin (HCC) 11/17/2020   Obesity 11/17/2020   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/2019   Hip dislocation, right (HCC) 04/13/2019   Other intervertebral disc degeneration, lumbar region 03/30/2019   CAD (coronary artery disease) 01/30/2019   Tobacco abuse 01/30/2019   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   Burn, foot, second degree, left, initial encounter 06/08/2017   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 03/16/2017   S/P total knee arthroplasty, left  10/26/2016   Hyperlipidemia 09/04/2014   Thrombocytopenia (HCC)    Precordial chest pain 04/05/2014   Coronary atherosclerosis of native coronary artery 10/01/2013   Other and unspecified hyperlipidemia 10/01/2013   Primary hypertension 10/01/2013   Type 2 diabetes mellitus with complication, without long-term current use of insulin (HCC) 10/01/2013   Esophageal reflux 10/01/2013   Hypertrophy of prostate without urinary obstruction and other lower urinary tract symptoms (LUTS) 10/01/2013   Past Medical History:  Diagnosis Date   Allergic rhinitis    Allergic rhinitis    Arthritis    Basal cell carcinoma 11/01/2019    bcc left chest treatment [place] [id number]    Chronic leg pain    right   Chronic lower back pain    Coronary artery disease    a. Stenting to RCA 2004; staged DES to  LAD and Cx 2004. DES to [facility] 2012. b. DES to mCx, PTCA to dCx 11/2011. c. Lateral wall MI 2013 s/p PTCA to distal Cx & DES to mid OM2 11/2011. d. Low risk nuc 04/2014, EF wnl.   COVID-19    Diabetes mellitus    Insulin dependent   Diabetic neuropathy (HCC)    MILD   Diverticulosis    Dysrhythmia    [name] syndrome    Gout    right wrist; right foot; right elbow; have had it since 1970's   H/O hiatal hernia    Heart murmur    History of echocardiogram    aortic sclerosis per echo 12/09 EF 65%, otherwise normal   History of hemorrhoids    BLEEDING   History of kidney stones    h/o   Hypertension    Diagnosed 1995    Myocardial infarction [facility])    Pancreatic pseudocyst    a. s/p remote drainage 2006.   Thrombocytopenia (HCC)    Seen on oldest labs in system from 2004   Vitamin B 12 deficiency    orally replaced    Family History  Problem Relation Age of Onset   Diabetes Mother    Hyperlipidemia Mother    Hypertension Mother    Cancer Father    Hypertension Father    Diabetes Sister    Hypertension Sister    Cancer Brother    Heart attack Neg Hx     Past Surgical History:  Procedure  Laterality Date   ABDOMINAL AORTOGRAM W/LOWER EXTREMITY Bilateral 08/08/2020   Procedure: ABDOMINAL AORTOGRAM W/LOWER EXTREMITY;  Surgeon: Chuck Hint, MD;  Location: [facility] INVASIVE CV LAB;  Service: Cardiovascular;  Laterality: Bilateral;   AMPUTATION Right 12/12/2020   Procedure: RIGHT BELOW KNEE AMPUTATION;  Surgeon: Nadara Mustard, MD;  Location: [facility] OR;  Service: Orthopedics;  Laterality: Right;   BACK SURGERY     "total of 3 times" S/P fall    CARPAL TUNNEL RELEASE Bilateral    CHOLECYSTECTOMY  1990's   COLONOSCOPY     CORONARY ANGIOPLASTY  11/11/11   CORONARY ANGIOPLASTY WITH STENT PLACEMENT  09/30/2011   "1 then; makes a total of 4"   CORONARY ANGIOPLASTY WITH STENT PLACEMENT  11/11/11   "1; makes a total of 5"   INGUINAL HERNIA REPAIR  2003   right   JOINT REPLACEMENT Right 04/03/2002   hip replacment   KNEE ARTHROSCOPY  1990's   left   LEFT HEART CATH AND CORONARY ANGIOGRAPHY N/A 04/25/2023   Procedure: LEFT HEART CATH AND CORONARY ANGIOGRAPHY;  Surgeon: Marykay Lex, MD;  Location: [facility] INVASIVE CV LAB;  Service: Cardiovascular;  Laterality: N/A;   LEFT HEART CATHETERIZATION WITH CORONARY ANGIOGRAM N/A 09/30/2011   Procedure: LEFT HEART CATHETERIZATION WITH CORONARY ANGIOGRAM;  Surgeon: Corky Crafts, MD;  Location: [facility] CATH LAB;  Service: Cardiovascular;  Laterality: N/A;  possible PCI   LEFT HEART CATHETERIZATION WITH CORONARY ANGIOGRAM N/A 11/15/2011   Procedure: LEFT HEART CATHETERIZATION WITH CORONARY ANGIOGRAM;  Surgeon: Corky Crafts, MD;  Location: [facility] CATH LAB;  Service: Cardiovascular;  Laterality: N/A;   PERCUTANEOUS CORONARY STENT INTERVENTION (PCI-S)  09/30/2011   Procedure: PERCUTANEOUS CORONARY STENT INTERVENTION (PCI-S);  Surgeon: Corky Crafts, MD;  Location[facility] CATH LAB;  Service: Cardiovascular;;   PERCUTANEOUS CORONARY STENT INTERVENTION (PCI-S) N/A 11/11/2011   Procedure: PERCUTANEOUS CORONARY STENT INTERVENTION (PCI-S);  Surgeon: Corky Crafts, MD;  Location: [facility] CATH LAB;  Service: Cardiovascular;  Laterality:  N/A;   PERIPHERAL VASCULAR BALLOON ANGIOPLASTY Right 08/08/2020   Procedure: PERIPHERAL VASCULAR BALLOON ANGIOPLASTY;  Surgeon: Chuck Hint, MD;  Location: [facility] INVASIVE CV LAB;  Service: Cardiovascular;  Laterality: Right;  Posterior tibial    SHOULDER SURGERY Right    X 2   STUMP REVISION Right 01/09/2021   Procedure: REVISION RIGHT BELOW KNEE AMPUTATION;  Surgeon: Nadara Mustard, MD;  Location: [facility] OR;  Service: Orthopedics;  Laterality: Right;   TONSILLECTOMY  ~ 1948   TOTAL HIP REVISION Right 04/13/2019   Procedure: RIGHT TOTAL HIP REVISION-POSTERIOR  APPROACH LATERAL;  Surgeon: Eldred Manges, MD;  Location: [facility] OR;  Service: Orthopedics;  Laterality: Right;   TOTAL KNEE ARTHROPLASTY Left 07/23/2016   Procedure: LEFT TOTAL KNEE ARTHROPLASTY;  Surgeon: Eldred Manges, MD;  Location: [facility] OR;  Service: Orthopedics;  Laterality: Left;   Social History   Occupational History   Occupation: Retired  Tobacco Use   Smoking status: Former   Smokeless tobacco: Current    Types: Chew   Tobacco comments:    quit 60 years ago  Vaping Use   Vaping status: Never Used  Substance and Sexual Activity   Alcohol use: No   Drug use: No   Sexual activity: Not Currently

## 2023-05-12 DIAGNOSIS — E860 Dehydration: Secondary | ICD-10-CM | POA: Diagnosis not present

## 2023-05-12 DIAGNOSIS — N3090 Cystitis, unspecified without hematuria: Secondary | ICD-10-CM | POA: Diagnosis not present

## 2023-05-12 DIAGNOSIS — Z23 Encounter for immunization: Secondary | ICD-10-CM | POA: Diagnosis not present

## 2023-05-12 DIAGNOSIS — Z955 Presence of coronary angioplasty implant and graft: Secondary | ICD-10-CM | POA: Diagnosis not present

## 2023-05-12 DIAGNOSIS — Z7189 Other specified counseling: Secondary | ICD-10-CM | POA: Diagnosis not present

## 2023-05-12 DIAGNOSIS — T82855A Stenosis of coronary artery stent, initial encounter: Secondary | ICD-10-CM | POA: Diagnosis not present

## 2023-05-12 DIAGNOSIS — N179 Acute kidney failure, unspecified: Secondary | ICD-10-CM | POA: Diagnosis not present

## 2023-05-12 DIAGNOSIS — S72002A Fracture of unspecified part of neck of left femur, initial encounter for closed fracture: Secondary | ICD-10-CM | POA: Diagnosis not present

## 2023-05-12 DIAGNOSIS — R69 Illness, unspecified: Secondary | ICD-10-CM | POA: Diagnosis not present

## 2023-05-12 DIAGNOSIS — R601 Generalized edema: Secondary | ICD-10-CM | POA: Diagnosis not present

## 2023-05-12 DIAGNOSIS — E1151 Type 2 diabetes mellitus with diabetic peripheral angiopathy without gangrene: Secondary | ICD-10-CM | POA: Diagnosis not present

## 2023-05-12 DIAGNOSIS — R2689 Other abnormalities of gait and mobility: Secondary | ICD-10-CM | POA: Diagnosis not present

## 2023-05-12 DIAGNOSIS — M9702XA Periprosthetic fracture around internal prosthetic left hip joint, initial encounter: Secondary | ICD-10-CM | POA: Diagnosis not present

## 2023-05-12 DIAGNOSIS — I6523 Occlusion and stenosis of bilateral carotid arteries: Secondary | ICD-10-CM | POA: Diagnosis not present

## 2023-05-12 DIAGNOSIS — S72042D Displaced fracture of base of neck of left femur, subsequent encounter for closed fracture with routine healing: Secondary | ICD-10-CM | POA: Diagnosis not present

## 2023-05-12 DIAGNOSIS — Z66 Do not resuscitate: Secondary | ICD-10-CM | POA: Diagnosis not present

## 2023-05-12 DIAGNOSIS — R1312 Dysphagia, oropharyngeal phase: Secondary | ICD-10-CM | POA: Diagnosis not present

## 2023-05-12 DIAGNOSIS — S8992XA Unspecified injury of left lower leg, initial encounter: Secondary | ICD-10-CM | POA: Diagnosis not present

## 2023-05-12 DIAGNOSIS — M9712XA Periprosthetic fracture around internal prosthetic left knee joint, initial encounter: Secondary | ICD-10-CM | POA: Diagnosis not present

## 2023-05-12 DIAGNOSIS — S72332A Displaced oblique fracture of shaft of left femur, initial encounter for closed fracture: Secondary | ICD-10-CM | POA: Diagnosis not present

## 2023-05-12 DIAGNOSIS — H8110 Benign paroxysmal vertigo, unspecified ear: Secondary | ICD-10-CM | POA: Diagnosis not present

## 2023-05-12 DIAGNOSIS — I252 Old myocardial infarction: Secondary | ICD-10-CM | POA: Diagnosis not present

## 2023-05-12 DIAGNOSIS — J986 Disorders of diaphragm: Secondary | ICD-10-CM | POA: Diagnosis not present

## 2023-05-12 DIAGNOSIS — I6503 Occlusion and stenosis of bilateral vertebral arteries: Secondary | ICD-10-CM | POA: Diagnosis not present

## 2023-05-12 DIAGNOSIS — R42 Dizziness and giddiness: Secondary | ICD-10-CM | POA: Diagnosis not present

## 2023-05-12 DIAGNOSIS — Z7401 Bed confinement status: Secondary | ICD-10-CM | POA: Diagnosis not present

## 2023-05-12 DIAGNOSIS — I6622 Occlusion and stenosis of left posterior cerebral artery: Secondary | ICD-10-CM | POA: Diagnosis not present

## 2023-05-12 DIAGNOSIS — I10 Essential (primary) hypertension: Secondary | ICD-10-CM | POA: Diagnosis not present

## 2023-05-12 DIAGNOSIS — N200 Calculus of kidney: Secondary | ICD-10-CM | POA: Diagnosis not present

## 2023-05-12 DIAGNOSIS — I219 Acute myocardial infarction, unspecified: Secondary | ICD-10-CM | POA: Diagnosis not present

## 2023-05-12 DIAGNOSIS — Z049 Encounter for examination and observation for unspecified reason: Secondary | ICD-10-CM | POA: Diagnosis not present

## 2023-05-12 DIAGNOSIS — I639 Cerebral infarction, unspecified: Secondary | ICD-10-CM | POA: Diagnosis not present

## 2023-05-12 DIAGNOSIS — E119 Type 2 diabetes mellitus without complications: Secondary | ICD-10-CM | POA: Diagnosis not present

## 2023-05-12 DIAGNOSIS — D62 Acute posthemorrhagic anemia: Secondary | ICD-10-CM | POA: Diagnosis not present

## 2023-05-12 DIAGNOSIS — Z8673 Personal history of transient ischemic attack (TIA), and cerebral infarction without residual deficits: Secondary | ICD-10-CM | POA: Diagnosis not present

## 2023-05-12 DIAGNOSIS — Z515 Encounter for palliative care: Secondary | ICD-10-CM | POA: Diagnosis not present

## 2023-05-12 DIAGNOSIS — D638 Anemia in other chronic diseases classified elsewhere: Secondary | ICD-10-CM | POA: Diagnosis not present

## 2023-05-12 DIAGNOSIS — R110 Nausea: Secondary | ICD-10-CM | POA: Diagnosis not present

## 2023-05-12 DIAGNOSIS — R079 Chest pain, unspecified: Secondary | ICD-10-CM | POA: Diagnosis not present

## 2023-05-12 DIAGNOSIS — E1122 Type 2 diabetes mellitus with diabetic chronic kidney disease: Secondary | ICD-10-CM | POA: Diagnosis not present

## 2023-05-12 DIAGNOSIS — Z111 Encounter for screening for respiratory tuberculosis: Secondary | ICD-10-CM | POA: Diagnosis not present

## 2023-05-12 DIAGNOSIS — I350 Nonrheumatic aortic (valve) stenosis: Secondary | ICD-10-CM | POA: Diagnosis not present

## 2023-05-12 DIAGNOSIS — I25110 Atherosclerotic heart disease of native coronary artery with unstable angina pectoris: Secondary | ICD-10-CM | POA: Diagnosis not present

## 2023-05-12 DIAGNOSIS — K8689 Other specified diseases of pancreas: Secondary | ICD-10-CM | POA: Diagnosis not present

## 2023-05-12 DIAGNOSIS — N3000 Acute cystitis without hematuria: Secondary | ICD-10-CM | POA: Diagnosis not present

## 2023-05-12 DIAGNOSIS — R296 Repeated falls: Secondary | ICD-10-CM | POA: Diagnosis not present

## 2023-05-12 DIAGNOSIS — W19XXXA Unspecified fall, initial encounter: Secondary | ICD-10-CM | POA: Diagnosis not present

## 2023-05-12 DIAGNOSIS — R918 Other nonspecific abnormal finding of lung field: Secondary | ICD-10-CM | POA: Diagnosis not present

## 2023-05-12 DIAGNOSIS — S72452A Displaced supracondylar fracture without intracondylar extension of lower end of left femur, initial encounter for closed fracture: Secondary | ICD-10-CM | POA: Diagnosis not present

## 2023-05-12 DIAGNOSIS — E118 Type 2 diabetes mellitus with unspecified complications: Secondary | ICD-10-CM | POA: Diagnosis not present

## 2023-05-12 DIAGNOSIS — B952 Enterococcus as the cause of diseases classified elsewhere: Secondary | ICD-10-CM | POA: Diagnosis not present

## 2023-05-12 DIAGNOSIS — E7800 Pure hypercholesterolemia, unspecified: Secondary | ICD-10-CM | POA: Diagnosis not present

## 2023-05-12 DIAGNOSIS — M6281 Muscle weakness (generalized): Secondary | ICD-10-CM | POA: Diagnosis not present

## 2023-05-12 DIAGNOSIS — I080 Rheumatic disorders of both mitral and aortic valves: Secondary | ICD-10-CM | POA: Diagnosis not present

## 2023-05-12 DIAGNOSIS — I517 Cardiomegaly: Secondary | ICD-10-CM | POA: Diagnosis not present

## 2023-05-12 DIAGNOSIS — R52 Pain, unspecified: Secondary | ICD-10-CM | POA: Diagnosis not present

## 2023-05-12 DIAGNOSIS — I2510 Atherosclerotic heart disease of native coronary artery without angina pectoris: Secondary | ICD-10-CM | POA: Diagnosis not present

## 2023-05-12 DIAGNOSIS — H353211 Exudative age-related macular degeneration, right eye, with active choroidal neovascularization: Secondary | ICD-10-CM | POA: Diagnosis not present

## 2023-05-12 DIAGNOSIS — E639 Nutritional deficiency, unspecified: Secondary | ICD-10-CM | POA: Diagnosis not present

## 2023-05-12 DIAGNOSIS — N1831 Chronic kidney disease, stage 3a: Secondary | ICD-10-CM | POA: Diagnosis not present

## 2023-05-12 DIAGNOSIS — I129 Hypertensive chronic kidney disease with stage 1 through stage 4 chronic kidney disease, or unspecified chronic kidney disease: Secondary | ICD-10-CM | POA: Diagnosis not present

## 2023-05-12 DIAGNOSIS — R0602 Shortness of breath: Secondary | ICD-10-CM | POA: Diagnosis not present

## 2023-05-12 DIAGNOSIS — D3502 Benign neoplasm of left adrenal gland: Secondary | ICD-10-CM | POA: Diagnosis not present

## 2023-05-12 DIAGNOSIS — N401 Enlarged prostate with lower urinary tract symptoms: Secondary | ICD-10-CM | POA: Diagnosis not present

## 2023-05-12 DIAGNOSIS — K5900 Constipation, unspecified: Secondary | ICD-10-CM | POA: Diagnosis not present

## 2023-05-12 DIAGNOSIS — M9702XD Periprosthetic fracture around internal prosthetic left hip joint, subsequent encounter: Secondary | ICD-10-CM | POA: Diagnosis not present

## 2023-05-12 DIAGNOSIS — R0789 Other chest pain: Secondary | ICD-10-CM | POA: Diagnosis not present

## 2023-05-13 DIAGNOSIS — S72452A Displaced supracondylar fracture without intracondylar extension of lower end of left femur, initial encounter for closed fracture: Secondary | ICD-10-CM | POA: Diagnosis not present

## 2023-05-13 DIAGNOSIS — I10 Essential (primary) hypertension: Secondary | ICD-10-CM | POA: Diagnosis not present

## 2023-05-13 DIAGNOSIS — Z8673 Personal history of transient ischemic attack (TIA), and cerebral infarction without residual deficits: Secondary | ICD-10-CM | POA: Diagnosis not present

## 2023-05-13 DIAGNOSIS — R079 Chest pain, unspecified: Secondary | ICD-10-CM | POA: Diagnosis not present

## 2023-05-13 DIAGNOSIS — N3000 Acute cystitis without hematuria: Secondary | ICD-10-CM | POA: Diagnosis not present

## 2023-05-13 DIAGNOSIS — M9712XA Periprosthetic fracture around internal prosthetic left knee joint, initial encounter: Secondary | ICD-10-CM | POA: Diagnosis not present

## 2023-05-13 DIAGNOSIS — I2510 Atherosclerotic heart disease of native coronary artery without angina pectoris: Secondary | ICD-10-CM | POA: Diagnosis not present

## 2023-05-13 DIAGNOSIS — S72002A Fracture of unspecified part of neck of left femur, initial encounter for closed fracture: Secondary | ICD-10-CM | POA: Diagnosis not present

## 2023-05-14 DIAGNOSIS — I2510 Atherosclerotic heart disease of native coronary artery without angina pectoris: Secondary | ICD-10-CM | POA: Diagnosis not present

## 2023-05-14 DIAGNOSIS — S72002A Fracture of unspecified part of neck of left femur, initial encounter for closed fracture: Secondary | ICD-10-CM | POA: Diagnosis not present

## 2023-05-14 DIAGNOSIS — I10 Essential (primary) hypertension: Secondary | ICD-10-CM | POA: Diagnosis not present

## 2023-05-14 DIAGNOSIS — N3000 Acute cystitis without hematuria: Secondary | ICD-10-CM | POA: Diagnosis not present

## 2023-05-14 DIAGNOSIS — R079 Chest pain, unspecified: Secondary | ICD-10-CM | POA: Diagnosis not present

## 2023-05-14 DIAGNOSIS — Z8673 Personal history of transient ischemic attack (TIA), and cerebral infarction without residual deficits: Secondary | ICD-10-CM | POA: Diagnosis not present

## 2023-05-15 DIAGNOSIS — R079 Chest pain, unspecified: Secondary | ICD-10-CM | POA: Diagnosis not present

## 2023-05-15 DIAGNOSIS — N3000 Acute cystitis without hematuria: Secondary | ICD-10-CM | POA: Diagnosis not present

## 2023-05-15 DIAGNOSIS — S72002A Fracture of unspecified part of neck of left femur, initial encounter for closed fracture: Secondary | ICD-10-CM | POA: Diagnosis not present

## 2023-05-15 DIAGNOSIS — I2510 Atherosclerotic heart disease of native coronary artery without angina pectoris: Secondary | ICD-10-CM | POA: Diagnosis not present

## 2023-05-15 DIAGNOSIS — Z8673 Personal history of transient ischemic attack (TIA), and cerebral infarction without residual deficits: Secondary | ICD-10-CM | POA: Diagnosis not present

## 2023-05-15 DIAGNOSIS — I10 Essential (primary) hypertension: Secondary | ICD-10-CM | POA: Diagnosis not present

## 2023-05-16 ENCOUNTER — Ambulatory Visit: Payer: Medicare Other | Admitting: Internal Medicine

## 2023-05-16 DIAGNOSIS — I10 Essential (primary) hypertension: Secondary | ICD-10-CM | POA: Diagnosis not present

## 2023-05-16 DIAGNOSIS — Z8673 Personal history of transient ischemic attack (TIA), and cerebral infarction without residual deficits: Secondary | ICD-10-CM | POA: Diagnosis not present

## 2023-05-16 DIAGNOSIS — I2510 Atherosclerotic heart disease of native coronary artery without angina pectoris: Secondary | ICD-10-CM | POA: Diagnosis not present

## 2023-05-16 DIAGNOSIS — R52 Pain, unspecified: Secondary | ICD-10-CM | POA: Diagnosis not present

## 2023-05-16 DIAGNOSIS — R110 Nausea: Secondary | ICD-10-CM | POA: Diagnosis not present

## 2023-05-16 DIAGNOSIS — N3000 Acute cystitis without hematuria: Secondary | ICD-10-CM | POA: Diagnosis not present

## 2023-05-16 DIAGNOSIS — Z7189 Other specified counseling: Secondary | ICD-10-CM | POA: Diagnosis not present

## 2023-05-16 DIAGNOSIS — K5900 Constipation, unspecified: Secondary | ICD-10-CM | POA: Diagnosis not present

## 2023-05-16 DIAGNOSIS — Z515 Encounter for palliative care: Secondary | ICD-10-CM | POA: Diagnosis not present

## 2023-05-16 DIAGNOSIS — S72002A Fracture of unspecified part of neck of left femur, initial encounter for closed fracture: Secondary | ICD-10-CM | POA: Diagnosis not present

## 2023-05-16 DIAGNOSIS — R079 Chest pain, unspecified: Secondary | ICD-10-CM | POA: Diagnosis not present

## 2023-05-16 DIAGNOSIS — S72332A Displaced oblique fracture of shaft of left femur, initial encounter for closed fracture: Secondary | ICD-10-CM | POA: Diagnosis not present

## 2023-05-17 DIAGNOSIS — S72002A Fracture of unspecified part of neck of left femur, initial encounter for closed fracture: Secondary | ICD-10-CM | POA: Diagnosis not present

## 2023-05-17 DIAGNOSIS — Z8673 Personal history of transient ischemic attack (TIA), and cerebral infarction without residual deficits: Secondary | ICD-10-CM | POA: Diagnosis not present

## 2023-05-17 DIAGNOSIS — I2510 Atherosclerotic heart disease of native coronary artery without angina pectoris: Secondary | ICD-10-CM | POA: Diagnosis not present

## 2023-05-17 DIAGNOSIS — N3000 Acute cystitis without hematuria: Secondary | ICD-10-CM | POA: Diagnosis not present

## 2023-05-17 DIAGNOSIS — N179 Acute kidney failure, unspecified: Secondary | ICD-10-CM | POA: Diagnosis not present

## 2023-05-17 DIAGNOSIS — I10 Essential (primary) hypertension: Secondary | ICD-10-CM | POA: Diagnosis not present

## 2023-05-18 DIAGNOSIS — W19XXXA Unspecified fall, initial encounter: Secondary | ICD-10-CM | POA: Diagnosis not present

## 2023-05-18 DIAGNOSIS — I10 Essential (primary) hypertension: Secondary | ICD-10-CM | POA: Diagnosis not present

## 2023-05-18 DIAGNOSIS — I129 Hypertensive chronic kidney disease with stage 1 through stage 4 chronic kidney disease, or unspecified chronic kidney disease: Secondary | ICD-10-CM | POA: Diagnosis not present

## 2023-05-18 DIAGNOSIS — R2689 Other abnormalities of gait and mobility: Secondary | ICD-10-CM | POA: Diagnosis not present

## 2023-05-18 DIAGNOSIS — E118 Type 2 diabetes mellitus with unspecified complications: Secondary | ICD-10-CM | POA: Diagnosis not present

## 2023-05-18 DIAGNOSIS — E1122 Type 2 diabetes mellitus with diabetic chronic kidney disease: Secondary | ICD-10-CM | POA: Diagnosis not present

## 2023-05-18 DIAGNOSIS — H353211 Exudative age-related macular degeneration, right eye, with active choroidal neovascularization: Secondary | ICD-10-CM | POA: Diagnosis not present

## 2023-05-18 DIAGNOSIS — Z7401 Bed confinement status: Secondary | ICD-10-CM | POA: Diagnosis not present

## 2023-05-18 DIAGNOSIS — D62 Acute posthemorrhagic anemia: Secondary | ICD-10-CM | POA: Diagnosis not present

## 2023-05-18 DIAGNOSIS — R69 Illness, unspecified: Secondary | ICD-10-CM | POA: Diagnosis not present

## 2023-05-18 DIAGNOSIS — Z720 Tobacco use: Secondary | ICD-10-CM | POA: Diagnosis not present

## 2023-05-18 DIAGNOSIS — R1312 Dysphagia, oropharyngeal phase: Secondary | ICD-10-CM | POA: Diagnosis not present

## 2023-05-18 DIAGNOSIS — Z23 Encounter for immunization: Secondary | ICD-10-CM | POA: Diagnosis not present

## 2023-05-18 DIAGNOSIS — I2510 Atherosclerotic heart disease of native coronary artery without angina pectoris: Secondary | ICD-10-CM | POA: Diagnosis not present

## 2023-05-18 DIAGNOSIS — Z7984 Long term (current) use of oral hypoglycemic drugs: Secondary | ICD-10-CM | POA: Diagnosis not present

## 2023-05-18 DIAGNOSIS — Z794 Long term (current) use of insulin: Secondary | ICD-10-CM | POA: Diagnosis not present

## 2023-05-18 DIAGNOSIS — S72042D Displaced fracture of base of neck of left femur, subsequent encounter for closed fracture with routine healing: Secondary | ICD-10-CM | POA: Diagnosis not present

## 2023-05-18 DIAGNOSIS — M1A9XX0 Chronic gout, unspecified, without tophus (tophi): Secondary | ICD-10-CM | POA: Diagnosis not present

## 2023-05-18 DIAGNOSIS — R531 Weakness: Secondary | ICD-10-CM | POA: Diagnosis not present

## 2023-05-18 DIAGNOSIS — Z7902 Long term (current) use of antithrombotics/antiplatelets: Secondary | ICD-10-CM | POA: Diagnosis not present

## 2023-05-18 DIAGNOSIS — K5900 Constipation, unspecified: Secondary | ICD-10-CM | POA: Diagnosis not present

## 2023-05-18 DIAGNOSIS — Z8673 Personal history of transient ischemic attack (TIA), and cerebral infarction without residual deficits: Secondary | ICD-10-CM | POA: Diagnosis not present

## 2023-05-18 DIAGNOSIS — N1831 Chronic kidney disease, stage 3a: Secondary | ICD-10-CM | POA: Diagnosis not present

## 2023-05-18 DIAGNOSIS — S72002A Fracture of unspecified part of neck of left femur, initial encounter for closed fracture: Secondary | ICD-10-CM | POA: Diagnosis not present

## 2023-05-18 DIAGNOSIS — N400 Enlarged prostate without lower urinary tract symptoms: Secondary | ICD-10-CM | POA: Diagnosis not present

## 2023-05-18 DIAGNOSIS — E639 Nutritional deficiency, unspecified: Secondary | ICD-10-CM | POA: Diagnosis not present

## 2023-05-18 DIAGNOSIS — E1151 Type 2 diabetes mellitus with diabetic peripheral angiopathy without gangrene: Secondary | ICD-10-CM | POA: Diagnosis not present

## 2023-05-18 DIAGNOSIS — S72002D Fracture of unspecified part of neck of left femur, subsequent encounter for closed fracture with routine healing: Secondary | ICD-10-CM | POA: Diagnosis not present

## 2023-05-18 DIAGNOSIS — E7800 Pure hypercholesterolemia, unspecified: Secondary | ICD-10-CM | POA: Diagnosis not present

## 2023-05-18 DIAGNOSIS — Z7982 Long term (current) use of aspirin: Secondary | ICD-10-CM | POA: Diagnosis not present

## 2023-05-18 DIAGNOSIS — M6281 Muscle weakness (generalized): Secondary | ICD-10-CM | POA: Diagnosis not present

## 2023-05-18 DIAGNOSIS — I219 Acute myocardial infarction, unspecified: Secondary | ICD-10-CM | POA: Diagnosis not present

## 2023-05-18 DIAGNOSIS — M109 Gout, unspecified: Secondary | ICD-10-CM | POA: Diagnosis not present

## 2023-05-18 DIAGNOSIS — M9712XD Periprosthetic fracture around internal prosthetic left knee joint, subsequent encounter: Secondary | ICD-10-CM | POA: Diagnosis not present

## 2023-05-18 DIAGNOSIS — E1165 Type 2 diabetes mellitus with hyperglycemia: Secondary | ICD-10-CM | POA: Diagnosis not present

## 2023-05-18 DIAGNOSIS — G4700 Insomnia, unspecified: Secondary | ICD-10-CM | POA: Diagnosis not present

## 2023-05-18 DIAGNOSIS — E785 Hyperlipidemia, unspecified: Secondary | ICD-10-CM | POA: Diagnosis not present

## 2023-05-18 DIAGNOSIS — Z111 Encounter for screening for respiratory tuberculosis: Secondary | ICD-10-CM | POA: Diagnosis not present

## 2023-05-18 DIAGNOSIS — Z89511 Acquired absence of right leg below knee: Secondary | ICD-10-CM | POA: Diagnosis not present

## 2023-05-18 DIAGNOSIS — I252 Old myocardial infarction: Secondary | ICD-10-CM | POA: Diagnosis not present

## 2023-05-18 DIAGNOSIS — N401 Enlarged prostate with lower urinary tract symptoms: Secondary | ICD-10-CM | POA: Diagnosis not present

## 2023-05-18 DIAGNOSIS — S72452D Displaced supracondylar fracture without intracondylar extension of lower end of left femur, subsequent encounter for closed fracture with routine healing: Secondary | ICD-10-CM | POA: Diagnosis not present

## 2023-05-18 DIAGNOSIS — N3000 Acute cystitis without hematuria: Secondary | ICD-10-CM | POA: Diagnosis not present

## 2023-05-18 DIAGNOSIS — I739 Peripheral vascular disease, unspecified: Secondary | ICD-10-CM | POA: Diagnosis not present

## 2023-05-18 DIAGNOSIS — N179 Acute kidney failure, unspecified: Secondary | ICD-10-CM | POA: Diagnosis not present

## 2023-05-24 DIAGNOSIS — S72002A Fracture of unspecified part of neck of left femur, initial encounter for closed fracture: Secondary | ICD-10-CM | POA: Diagnosis not present

## 2023-05-24 DIAGNOSIS — G4700 Insomnia, unspecified: Secondary | ICD-10-CM | POA: Diagnosis not present

## 2023-05-24 DIAGNOSIS — I252 Old myocardial infarction: Secondary | ICD-10-CM | POA: Diagnosis not present

## 2023-05-24 DIAGNOSIS — I25118 Atherosclerotic heart disease of native coronary artery with other forms of angina pectoris: Secondary | ICD-10-CM | POA: Diagnosis not present

## 2023-05-24 DIAGNOSIS — Z96659 Presence of unspecified artificial knee joint: Secondary | ICD-10-CM | POA: Diagnosis not present

## 2023-05-24 DIAGNOSIS — S72452D Displaced supracondylar fracture without intracondylar extension of lower end of left femur, subsequent encounter for closed fracture with routine healing: Secondary | ICD-10-CM | POA: Diagnosis not present

## 2023-05-24 DIAGNOSIS — I10 Essential (primary) hypertension: Secondary | ICD-10-CM | POA: Diagnosis not present

## 2023-05-24 DIAGNOSIS — E1151 Type 2 diabetes mellitus with diabetic peripheral angiopathy without gangrene: Secondary | ICD-10-CM | POA: Diagnosis not present

## 2023-05-24 DIAGNOSIS — N3000 Acute cystitis without hematuria: Secondary | ICD-10-CM | POA: Diagnosis not present

## 2023-05-24 DIAGNOSIS — D649 Anemia, unspecified: Secondary | ICD-10-CM | POA: Diagnosis not present

## 2023-05-24 DIAGNOSIS — S72042D Displaced fracture of base of neck of left femur, subsequent encounter for closed fracture with routine healing: Secondary | ICD-10-CM | POA: Diagnosis not present

## 2023-05-24 DIAGNOSIS — E118 Type 2 diabetes mellitus with unspecified complications: Secondary | ICD-10-CM | POA: Diagnosis not present

## 2023-05-24 DIAGNOSIS — I69398 Other sequelae of cerebral infarction: Secondary | ICD-10-CM | POA: Diagnosis not present

## 2023-05-24 DIAGNOSIS — M9712XD Periprosthetic fracture around internal prosthetic left knee joint, subsequent encounter: Secondary | ICD-10-CM | POA: Diagnosis not present

## 2023-05-24 DIAGNOSIS — Z7984 Long term (current) use of oral hypoglycemic drugs: Secondary | ICD-10-CM | POA: Diagnosis not present

## 2023-05-24 DIAGNOSIS — E785 Hyperlipidemia, unspecified: Secondary | ICD-10-CM | POA: Diagnosis not present

## 2023-05-24 DIAGNOSIS — I129 Hypertensive chronic kidney disease with stage 1 through stage 4 chronic kidney disease, or unspecified chronic kidney disease: Secondary | ICD-10-CM | POA: Diagnosis not present

## 2023-05-24 DIAGNOSIS — M978XXA Periprosthetic fracture around other internal prosthetic joint, initial encounter: Secondary | ICD-10-CM | POA: Diagnosis not present

## 2023-05-24 DIAGNOSIS — Z89511 Acquired absence of right leg below knee: Secondary | ICD-10-CM | POA: Diagnosis not present

## 2023-05-24 DIAGNOSIS — R531 Weakness: Secondary | ICD-10-CM | POA: Diagnosis not present

## 2023-05-24 DIAGNOSIS — Z794 Long term (current) use of insulin: Secondary | ICD-10-CM | POA: Diagnosis not present

## 2023-05-24 DIAGNOSIS — I2510 Atherosclerotic heart disease of native coronary artery without angina pectoris: Secondary | ICD-10-CM | POA: Diagnosis not present

## 2023-05-24 DIAGNOSIS — M1A9XX0 Chronic gout, unspecified, without tophus (tophi): Secondary | ICD-10-CM | POA: Diagnosis not present

## 2023-05-24 DIAGNOSIS — E1165 Type 2 diabetes mellitus with hyperglycemia: Secondary | ICD-10-CM | POA: Diagnosis not present

## 2023-05-24 DIAGNOSIS — N400 Enlarged prostate without lower urinary tract symptoms: Secondary | ICD-10-CM | POA: Diagnosis not present

## 2023-05-24 DIAGNOSIS — N1831 Chronic kidney disease, stage 3a: Secondary | ICD-10-CM | POA: Diagnosis not present

## 2023-05-24 DIAGNOSIS — E1122 Type 2 diabetes mellitus with diabetic chronic kidney disease: Secondary | ICD-10-CM | POA: Diagnosis not present

## 2023-05-24 DIAGNOSIS — Z8673 Personal history of transient ischemic attack (TIA), and cerebral infarction without residual deficits: Secondary | ICD-10-CM | POA: Diagnosis not present

## 2023-05-24 DIAGNOSIS — I739 Peripheral vascular disease, unspecified: Secondary | ICD-10-CM | POA: Diagnosis not present

## 2023-05-24 DIAGNOSIS — D62 Acute posthemorrhagic anemia: Secondary | ICD-10-CM | POA: Diagnosis not present

## 2023-05-24 DIAGNOSIS — R2689 Other abnormalities of gait and mobility: Secondary | ICD-10-CM | POA: Diagnosis not present

## 2023-05-24 DIAGNOSIS — S72002D Fracture of unspecified part of neck of left femur, subsequent encounter for closed fracture with routine healing: Secondary | ICD-10-CM | POA: Diagnosis not present

## 2023-05-24 DIAGNOSIS — Z720 Tobacco use: Secondary | ICD-10-CM | POA: Diagnosis not present

## 2023-05-24 DIAGNOSIS — M109 Gout, unspecified: Secondary | ICD-10-CM | POA: Diagnosis not present

## 2023-05-24 DIAGNOSIS — R260 Ataxic gait: Secondary | ICD-10-CM | POA: Diagnosis not present

## 2023-05-24 DIAGNOSIS — Z7982 Long term (current) use of aspirin: Secondary | ICD-10-CM | POA: Diagnosis not present

## 2023-05-24 DIAGNOSIS — Z7902 Long term (current) use of antithrombotics/antiplatelets: Secondary | ICD-10-CM | POA: Diagnosis not present

## 2023-05-29 ENCOUNTER — Other Ambulatory Visit: Payer: Self-pay | Admitting: Internal Medicine

## 2023-06-05 DIAGNOSIS — I252 Old myocardial infarction: Secondary | ICD-10-CM | POA: Diagnosis not present

## 2023-06-05 DIAGNOSIS — N401 Enlarged prostate with lower urinary tract symptoms: Secondary | ICD-10-CM | POA: Diagnosis not present

## 2023-06-05 DIAGNOSIS — I10 Essential (primary) hypertension: Secondary | ICD-10-CM | POA: Diagnosis not present

## 2023-06-05 DIAGNOSIS — Z556 Problems related to health literacy: Secondary | ICD-10-CM | POA: Diagnosis not present

## 2023-06-05 DIAGNOSIS — H353211 Exudative age-related macular degeneration, right eye, with active choroidal neovascularization: Secondary | ICD-10-CM | POA: Diagnosis not present

## 2023-06-05 DIAGNOSIS — Z89511 Acquired absence of right leg below knee: Secondary | ICD-10-CM | POA: Diagnosis not present

## 2023-06-05 DIAGNOSIS — Z7902 Long term (current) use of antithrombotics/antiplatelets: Secondary | ICD-10-CM | POA: Diagnosis not present

## 2023-06-05 DIAGNOSIS — E7800 Pure hypercholesterolemia, unspecified: Secondary | ICD-10-CM | POA: Diagnosis not present

## 2023-06-05 DIAGNOSIS — Z7982 Long term (current) use of aspirin: Secondary | ICD-10-CM | POA: Diagnosis not present

## 2023-06-05 DIAGNOSIS — Z993 Dependence on wheelchair: Secondary | ICD-10-CM | POA: Diagnosis not present

## 2023-06-05 DIAGNOSIS — M978XXD Periprosthetic fracture around other internal prosthetic joint, subsequent encounter: Secondary | ICD-10-CM | POA: Diagnosis not present

## 2023-06-05 DIAGNOSIS — Z9181 History of falling: Secondary | ICD-10-CM | POA: Diagnosis not present

## 2023-06-05 DIAGNOSIS — N138 Other obstructive and reflux uropathy: Secondary | ICD-10-CM | POA: Diagnosis not present

## 2023-06-05 DIAGNOSIS — Z7984 Long term (current) use of oral hypoglycemic drugs: Secondary | ICD-10-CM | POA: Diagnosis not present

## 2023-06-05 DIAGNOSIS — S72452D Displaced supracondylar fracture without intracondylar extension of lower end of left femur, subsequent encounter for closed fracture with routine healing: Secondary | ICD-10-CM | POA: Diagnosis not present

## 2023-06-05 DIAGNOSIS — G4700 Insomnia, unspecified: Secondary | ICD-10-CM | POA: Diagnosis not present

## 2023-06-05 DIAGNOSIS — H353132 Nonexudative age-related macular degeneration, bilateral, intermediate dry stage: Secondary | ICD-10-CM | POA: Diagnosis not present

## 2023-06-05 DIAGNOSIS — I2510 Atherosclerotic heart disease of native coronary artery without angina pectoris: Secondary | ICD-10-CM | POA: Diagnosis not present

## 2023-06-05 DIAGNOSIS — Z8673 Personal history of transient ischemic attack (TIA), and cerebral infarction without residual deficits: Secondary | ICD-10-CM | POA: Diagnosis not present

## 2023-06-05 DIAGNOSIS — Z794 Long term (current) use of insulin: Secondary | ICD-10-CM | POA: Diagnosis not present

## 2023-06-05 DIAGNOSIS — E1165 Type 2 diabetes mellitus with hyperglycemia: Secondary | ICD-10-CM | POA: Diagnosis not present

## 2023-06-05 DIAGNOSIS — Z602 Problems related to living alone: Secondary | ICD-10-CM | POA: Diagnosis not present

## 2023-06-05 DIAGNOSIS — M109 Gout, unspecified: Secondary | ICD-10-CM | POA: Diagnosis not present

## 2023-06-07 DIAGNOSIS — M978XXD Periprosthetic fracture around other internal prosthetic joint, subsequent encounter: Secondary | ICD-10-CM | POA: Diagnosis not present

## 2023-06-07 DIAGNOSIS — Z96659 Presence of unspecified artificial knee joint: Secondary | ICD-10-CM | POA: Diagnosis not present

## 2023-06-09 DIAGNOSIS — N1831 Chronic kidney disease, stage 3a: Secondary | ICD-10-CM | POA: Diagnosis not present

## 2023-06-09 DIAGNOSIS — R079 Chest pain, unspecified: Secondary | ICD-10-CM | POA: Diagnosis not present

## 2023-06-09 DIAGNOSIS — D649 Anemia, unspecified: Secondary | ICD-10-CM | POA: Diagnosis not present

## 2023-06-09 DIAGNOSIS — K2970 Gastritis, unspecified, without bleeding: Secondary | ICD-10-CM | POA: Diagnosis not present

## 2023-06-09 DIAGNOSIS — N179 Acute kidney failure, unspecified: Secondary | ICD-10-CM | POA: Diagnosis not present

## 2023-06-09 DIAGNOSIS — K2971 Gastritis, unspecified, with bleeding: Secondary | ICD-10-CM | POA: Diagnosis not present

## 2023-06-09 DIAGNOSIS — I2582 Chronic total occlusion of coronary artery: Secondary | ICD-10-CM | POA: Diagnosis not present

## 2023-06-09 DIAGNOSIS — I161 Hypertensive emergency: Secondary | ICD-10-CM | POA: Diagnosis not present

## 2023-06-09 DIAGNOSIS — D696 Thrombocytopenia, unspecified: Secondary | ICD-10-CM | POA: Diagnosis not present

## 2023-06-09 DIAGNOSIS — E871 Hypo-osmolality and hyponatremia: Secondary | ICD-10-CM | POA: Diagnosis not present

## 2023-06-09 DIAGNOSIS — E785 Hyperlipidemia, unspecified: Secondary | ICD-10-CM | POA: Diagnosis not present

## 2023-06-09 DIAGNOSIS — D3502 Benign neoplasm of left adrenal gland: Secondary | ICD-10-CM | POA: Diagnosis not present

## 2023-06-09 DIAGNOSIS — I252 Old myocardial infarction: Secondary | ICD-10-CM | POA: Diagnosis not present

## 2023-06-09 DIAGNOSIS — Z7902 Long term (current) use of antithrombotics/antiplatelets: Secondary | ICD-10-CM | POA: Diagnosis not present

## 2023-06-09 DIAGNOSIS — I350 Nonrheumatic aortic (valve) stenosis: Secondary | ICD-10-CM | POA: Diagnosis not present

## 2023-06-09 DIAGNOSIS — R000 Tachycardia, unspecified: Secondary | ICD-10-CM | POA: Diagnosis not present

## 2023-06-09 DIAGNOSIS — K920 Hematemesis: Secondary | ICD-10-CM | POA: Diagnosis not present

## 2023-06-09 DIAGNOSIS — K2960 Other gastritis without bleeding: Secondary | ICD-10-CM | POA: Diagnosis not present

## 2023-06-09 DIAGNOSIS — K449 Diaphragmatic hernia without obstruction or gangrene: Secondary | ICD-10-CM | POA: Diagnosis not present

## 2023-06-09 DIAGNOSIS — R7401 Elevation of levels of liver transaminase levels: Secondary | ICD-10-CM | POA: Diagnosis not present

## 2023-06-09 DIAGNOSIS — N401 Enlarged prostate with lower urinary tract symptoms: Secondary | ICD-10-CM | POA: Diagnosis not present

## 2023-06-09 DIAGNOSIS — Z7982 Long term (current) use of aspirin: Secondary | ICD-10-CM | POA: Diagnosis not present

## 2023-06-09 DIAGNOSIS — N200 Calculus of kidney: Secondary | ICD-10-CM | POA: Diagnosis not present

## 2023-06-09 DIAGNOSIS — I5021 Acute systolic (congestive) heart failure: Secondary | ICD-10-CM | POA: Diagnosis not present

## 2023-06-09 DIAGNOSIS — I2510 Atherosclerotic heart disease of native coronary artery without angina pectoris: Secondary | ICD-10-CM | POA: Diagnosis not present

## 2023-06-09 DIAGNOSIS — I739 Peripheral vascular disease, unspecified: Secondary | ICD-10-CM | POA: Diagnosis not present

## 2023-06-09 DIAGNOSIS — M7989 Other specified soft tissue disorders: Secondary | ICD-10-CM | POA: Diagnosis not present

## 2023-06-09 DIAGNOSIS — E119 Type 2 diabetes mellitus without complications: Secondary | ICD-10-CM | POA: Diagnosis not present

## 2023-06-09 DIAGNOSIS — K922 Gastrointestinal hemorrhage, unspecified: Secondary | ICD-10-CM | POA: Diagnosis not present

## 2023-06-09 DIAGNOSIS — I708 Atherosclerosis of other arteries: Secondary | ICD-10-CM | POA: Diagnosis not present

## 2023-06-09 DIAGNOSIS — E1165 Type 2 diabetes mellitus with hyperglycemia: Secondary | ICD-10-CM | POA: Diagnosis not present

## 2023-06-09 DIAGNOSIS — D72829 Elevated white blood cell count, unspecified: Secondary | ICD-10-CM | POA: Diagnosis not present

## 2023-06-09 DIAGNOSIS — I255 Ischemic cardiomyopathy: Secondary | ICD-10-CM | POA: Diagnosis not present

## 2023-06-09 DIAGNOSIS — R7989 Other specified abnormal findings of blood chemistry: Secondary | ICD-10-CM | POA: Diagnosis not present

## 2023-06-09 DIAGNOSIS — R0789 Other chest pain: Secondary | ICD-10-CM | POA: Diagnosis not present

## 2023-06-09 DIAGNOSIS — E1110 Type 2 diabetes mellitus with ketoacidosis without coma: Secondary | ICD-10-CM | POA: Diagnosis not present

## 2023-06-09 DIAGNOSIS — R710 Precipitous drop in hematocrit: Secondary | ICD-10-CM | POA: Diagnosis not present

## 2023-06-09 DIAGNOSIS — D62 Acute posthemorrhagic anemia: Secondary | ICD-10-CM | POA: Diagnosis not present

## 2023-06-09 DIAGNOSIS — I213 ST elevation (STEMI) myocardial infarction of unspecified site: Secondary | ICD-10-CM | POA: Diagnosis not present

## 2023-06-09 DIAGNOSIS — N3000 Acute cystitis without hematuria: Secondary | ICD-10-CM | POA: Diagnosis not present

## 2023-06-09 DIAGNOSIS — E1122 Type 2 diabetes mellitus with diabetic chronic kidney disease: Secondary | ICD-10-CM | POA: Diagnosis not present

## 2023-06-09 DIAGNOSIS — I10 Essential (primary) hypertension: Secondary | ICD-10-CM | POA: Diagnosis not present

## 2023-06-09 DIAGNOSIS — R739 Hyperglycemia, unspecified: Secondary | ICD-10-CM | POA: Diagnosis not present

## 2023-06-09 DIAGNOSIS — Z7189 Other specified counseling: Secondary | ICD-10-CM | POA: Diagnosis not present

## 2023-06-09 DIAGNOSIS — I4510 Unspecified right bundle-branch block: Secondary | ICD-10-CM | POA: Diagnosis not present

## 2023-06-09 DIAGNOSIS — K5730 Diverticulosis of large intestine without perforation or abscess without bleeding: Secondary | ICD-10-CM | POA: Diagnosis not present

## 2023-06-09 DIAGNOSIS — R1110 Vomiting, unspecified: Secondary | ICD-10-CM | POA: Diagnosis not present

## 2023-06-09 DIAGNOSIS — E872 Acidosis, unspecified: Secondary | ICD-10-CM | POA: Diagnosis not present

## 2023-06-09 DIAGNOSIS — I5189 Other ill-defined heart diseases: Secondary | ICD-10-CM | POA: Diagnosis not present

## 2023-06-09 DIAGNOSIS — K5900 Constipation, unspecified: Secondary | ICD-10-CM | POA: Diagnosis not present

## 2023-06-09 DIAGNOSIS — L539 Erythematous condition, unspecified: Secondary | ICD-10-CM | POA: Diagnosis not present

## 2023-06-09 DIAGNOSIS — E8729 Other acidosis: Secondary | ICD-10-CM | POA: Diagnosis not present

## 2023-06-09 DIAGNOSIS — I21A1 Myocardial infarction type 2: Secondary | ICD-10-CM | POA: Diagnosis not present

## 2023-06-09 DIAGNOSIS — I130 Hypertensive heart and chronic kidney disease with heart failure and stage 1 through stage 4 chronic kidney disease, or unspecified chronic kidney disease: Secondary | ICD-10-CM | POA: Diagnosis not present

## 2023-06-09 DIAGNOSIS — Z049 Encounter for examination and observation for unspecified reason: Secondary | ICD-10-CM | POA: Diagnosis not present

## 2023-06-17 DIAGNOSIS — M978XXD Periprosthetic fracture around other internal prosthetic joint, subsequent encounter: Secondary | ICD-10-CM | POA: Diagnosis not present

## 2023-06-17 DIAGNOSIS — S72452D Displaced supracondylar fracture without intracondylar extension of lower end of left femur, subsequent encounter for closed fracture with routine healing: Secondary | ICD-10-CM | POA: Diagnosis not present

## 2023-06-17 DIAGNOSIS — Z89511 Acquired absence of right leg below knee: Secondary | ICD-10-CM | POA: Diagnosis not present

## 2023-06-17 DIAGNOSIS — I10 Essential (primary) hypertension: Secondary | ICD-10-CM | POA: Diagnosis not present

## 2023-06-17 DIAGNOSIS — E1165 Type 2 diabetes mellitus with hyperglycemia: Secondary | ICD-10-CM | POA: Diagnosis not present

## 2023-06-17 DIAGNOSIS — Z794 Long term (current) use of insulin: Secondary | ICD-10-CM | POA: Diagnosis not present

## 2023-06-22 ENCOUNTER — Other Ambulatory Visit: Payer: Self-pay

## 2023-06-22 ENCOUNTER — Emergency Department (HOSPITAL_BASED_OUTPATIENT_CLINIC_OR_DEPARTMENT_OTHER): Payer: Medicare Other | Admitting: Radiology

## 2023-06-22 ENCOUNTER — Emergency Department (HOSPITAL_BASED_OUTPATIENT_CLINIC_OR_DEPARTMENT_OTHER)
Admission: EM | Admit: 2023-06-22 | Discharge: 2023-06-22 | Disposition: A | Discharge status: Home / Self Care | Payer: Medicare Other | Attending: Emergency Medicine | Admitting: Emergency Medicine

## 2023-06-22 ENCOUNTER — Encounter (HOSPITAL_BASED_OUTPATIENT_CLINIC_OR_DEPARTMENT_OTHER): Payer: Self-pay | Admitting: Emergency Medicine

## 2023-06-22 DIAGNOSIS — L03116 Cellulitis of left lower limb: Secondary | ICD-10-CM | POA: Diagnosis not present

## 2023-06-22 DIAGNOSIS — Z794 Long term (current) use of insulin: Secondary | ICD-10-CM | POA: Diagnosis not present

## 2023-06-22 DIAGNOSIS — J90 Pleural effusion, not elsewhere classified: Secondary | ICD-10-CM | POA: Diagnosis not present

## 2023-06-22 DIAGNOSIS — S72452D Displaced supracondylar fracture without intracondylar extension of lower end of left femur, subsequent encounter for closed fracture with routine healing: Secondary | ICD-10-CM | POA: Diagnosis not present

## 2023-06-22 DIAGNOSIS — R778 Other specified abnormalities of plasma proteins: Secondary | ICD-10-CM | POA: Diagnosis not present

## 2023-06-22 DIAGNOSIS — R7989 Other specified abnormal findings of blood chemistry: Secondary | ICD-10-CM | POA: Diagnosis not present

## 2023-06-22 DIAGNOSIS — L03115 Cellulitis of right lower limb: Secondary | ICD-10-CM | POA: Insufficient documentation

## 2023-06-22 DIAGNOSIS — D72829 Elevated white blood cell count, unspecified: Secondary | ICD-10-CM | POA: Insufficient documentation

## 2023-06-22 DIAGNOSIS — R600 Localized edema: Secondary | ICD-10-CM

## 2023-06-22 DIAGNOSIS — M7989 Other specified soft tissue disorders: Secondary | ICD-10-CM | POA: Diagnosis present

## 2023-06-22 DIAGNOSIS — Z89511 Acquired absence of right leg below knee: Secondary | ICD-10-CM | POA: Diagnosis not present

## 2023-06-22 DIAGNOSIS — M978XXD Periprosthetic fracture around other internal prosthetic joint, subsequent encounter: Secondary | ICD-10-CM | POA: Diagnosis not present

## 2023-06-22 DIAGNOSIS — L0390 Cellulitis, unspecified: Secondary | ICD-10-CM

## 2023-06-22 DIAGNOSIS — E1165 Type 2 diabetes mellitus with hyperglycemia: Secondary | ICD-10-CM | POA: Diagnosis not present

## 2023-06-22 DIAGNOSIS — I10 Essential (primary) hypertension: Secondary | ICD-10-CM | POA: Diagnosis not present

## 2023-06-22 LAB — BASIC METABOLIC PANEL WITH GFR
Anion gap: 10 (ref 5–15)
BUN: 23 mg/dL (ref 8–23)
CO2: 24 mmol/L (ref 22–32)
Calcium: 8.6 mg/dL — ABNORMAL LOW (ref 8.9–10.3)
Chloride: 104 mmol/L (ref 98–111)
Creatinine, Ser: 1.22 mg/dL (ref 0.61–1.24)
GFR, Estimated: 59 mL/min — ABNORMAL LOW (ref >60)
Glucose, Bld: 113 mg/dL — ABNORMAL HIGH (ref 70–99)
Potassium: 4 mmol/L (ref 3.5–5.1)
Sodium: 138 mmol/L (ref 135–145)

## 2023-06-22 LAB — CBC
HCT: 28.8 % — ABNORMAL LOW (ref 39.0–52.0)
Hemoglobin: 9.1 g/dL — ABNORMAL LOW (ref 13.0–17.0)
MCH: 30 pg (ref 26.0–34.0)
MCHC: 31.6 g/dL (ref 30.0–36.0)
MCV: 95 fL (ref 80.0–100.0)
Platelets: 116 10*3/uL — ABNORMAL LOW (ref 150–400)
RBC: 3.03 MIL/uL — ABNORMAL LOW (ref 4.22–5.81)
RDW: 17.9 % — ABNORMAL HIGH (ref 11.5–15.5)
WBC: 15.6 10*3/uL — ABNORMAL HIGH (ref 4.0–10.5)
nRBC: 0 % (ref 0.0–0.2)

## 2023-06-22 MED ORDER — AMOXICILLIN-POT CLAVULANATE 875-125 MG PO TABS: 1.0000 | ORAL_TABLET | Freq: Two times a day (BID) | ORAL | 0 refills | Status: DC

## 2023-06-22 MED ORDER — FUROSEMIDE 20 MG PO TABS: 20.0000 mg | ORAL_TABLET | Freq: Every day | ORAL | 0 refills | Status: DC

## 2023-06-22 MED ORDER — SODIUM CHLORIDE 0.9 % IV SOLN
1.0000 g | Freq: Once | INTRAVENOUS | Status: AC
  Administered 2023-06-22: 1 g via INTRAVENOUS
  Filled 2023-06-22: qty 10

## 2023-06-22 NOTE — ED Triage Notes (Signed)
Pt via pov from home with swelling and redness to his left arm. Pt reports that his declawed cat rubbed his paw across the arm a few days ago and drew blood. Pt states it happened 2 days ago and the swelling began last night.   Pt has bka 3 years ago and surgery for broken femur on 05/12/23 and both legs are swollen. He was told by his surgeon that the left leg will probably swell at times, but the right leg is also swollen and he is unable to use his prostetic leg at this time due to the swelling.   Pt alert & oriented, nad noted.

## 2023-06-22 NOTE — ED Notes (Signed)
Pt and family standing at nurses station upset DC is taking so long. Delay explained multiple times. DC papers reviewed. No questions or concerns. No signs of distress. Pt assisted to wheelchair and out to lobby. Appropriate measures for safety taken.

## 2023-06-22 NOTE — ED Provider Notes (Signed)
[place] EMERGENCY DEPARTMENT AT [facility] Provider Note   CSN: [id number] Arrival date & time: 06/22/23  1650     History Chief Complaint  Patient presents with   Arm Swelling    HPI Keith Espinoza is a 82 y.o. male presenting for multiple complaints. Left arm redness and swelling since yesterday with subjective fevers since his cat scratched his arm.  He has a history of recurrent cellulitis vasculopathy and status post BKA of the RLE. BLLE swelling since his discharge from a recent STEMI that was not intervenable.  His BKA stump site has gotten so large that he cannot fit the prosthesis on anymore   Patient's recorded medical, surgical, social, medication list and allergies were reviewed in the Snapshot window as part of the initial history.   Review of Systems   Review of Systems  Constitutional:  Negative for chills and fever.  HENT:  Negative for ear pain and sore throat.   Eyes:  Negative for pain and visual disturbance.  Respiratory:  Positive for shortness of breath. Negative for cough.   Cardiovascular:  Positive for leg swelling. Negative for chest pain and palpitations.  Gastrointestinal:  Negative for abdominal pain and vomiting.  Genitourinary:  Negative for dysuria and hematuria.  Musculoskeletal:  Negative for arthralgias and back pain.  Skin:  Negative for color change and rash.  Neurological:  Negative for seizures and syncope.  All other systems reviewed and are negative.   Physical Exam Updated Vital Signs BP 122/60   Pulse 70   Temp 98.3 F (36.8 C)   Resp 16   Ht 5\' 10"  (1.778 m)   Wt 74.4 kg   SpO2 99%   BMI 23.53 kg/m  Physical Exam Vitals and nursing note reviewed.  Constitutional:      General: He is not in acute distress.    Appearance: He is well-developed.  HENT:     Head: Normocephalic and atraumatic.  Eyes:     Conjunctiva/sclera: Conjunctivae normal.  Cardiovascular:     Rate and Rhythm: Normal rate and  regular rhythm.     Heart sounds: No murmur heard. Pulmonary:     Effort: Pulmonary effort is normal. No respiratory distress.     Breath sounds: Normal breath sounds.  Abdominal:     Palpations: Abdomen is soft.     Tenderness: There is no abdominal tenderness.  Musculoskeletal:        General: No swelling.     Cervical back: Neck supple.     Right lower leg: Edema present.     Left lower leg: Edema present.  Skin:    General: Skin is warm and dry.     Capillary Refill: Capillary refill takes less than 2 seconds.  Neurological:     Mental Status: He is alert.  Psychiatric:        Mood and Affect: Mood normal.      ED Course/ Medical Decision Making/ A&P    Procedures Procedures   Medications Ordered in ED Medications  cefTRIAXone (ROCEPHIN) 1 g in sodium chloride 0.9 % 100 mL IVPB (0 g Intravenous Stopped 06/22/23 2115)    Medical Decision Making:    Keith Espinoza is a 82 y.o. male who presented to the ED today with SOB and cellulitis detailed above.     Patient placed on continuous vitals and telemetry monitoring while in ED which was reviewed periodically.   Complete initial physical exam performed, notably the patient  was .  Reviewed and confirmed nursing documentation for past medical history, family history, social history.    Initial Assessment:   Concerning patient's left arm swelling.  This appears to be consistent with cellulitis.  Screening labs were ordered and revealed leukocytosis. Of considered a DVT but the history of present illness is less consistent with that especially in the setting of subjective chills and the findings of erythema and tenderness around the site of the cut.  Will treat with Rocephin in the emergency department and start on Augmentin in the outpatient setting.  Does not appear to be Bartonella as there is no lymphadenopathy involvement more likely just gram-positive coverage is sufficient  Concerning his bilateral  lower extremity swelling.was recently diagnosed with occlusive myocardial infarction that was deemed to not be intervenable all.  Patient is likely developing heart failure and has sequelae of volume overload in the form of bilateral lower extremity swelling.  Will refer to cardiology in the outpatient setting and treat with mild diuresis given very mild nature of symptoms right now.  He does not have signs of respiratory involvement so 20 mg daily with plan to repeat blood work in 5 days with his PCP would be sufficient at this time strict return precautions were reinforced.  This is most consistent with an acute life/limb threatening illness complicated by underlying chronic conditions.  Radiology  All images reviewed independently. Agree with radiology report at this time.   DG Chest 2 View  Result Date: 06/22/2023 CLINICAL DATA:  swelling. EXAM: CHEST - 2 VIEW COMPARISON:  04/22/2023. FINDINGS: Bilateral lung fields are clear. There is blunting of left lateral costophrenic angle, suggesting trace left pleural effusion. Right costophrenic angle is clear. Normal cardio-mediastinal silhouette. No acute osseous abnormalities. There are metallic anchors overlying the right proximal humerus, likely from prior rotator cuff repair. The soft tissues are within normal limits. IMPRESSION: 1. Trace left pleural effusion. Electronically Signed   By: Jules Schick M.D.   On: 06/22/2023 18:31       On reassessment: Patient has tolerated administration of ceftriaxone is in no acute distress on serial assessments.  Will start the Lasix in the a.m. follow-up with PCP as discussed.  I was called back to bedside as patient has requested discharge and feels comfort with outpatient management of these conditions.  Patient discharged with no further acute events.  Disposition:  I have considered need for hospitalization, however, considering all of the above, I believe this patient is stable for discharge at this  time.  Patient/family educated about specific return precautions for given chief complaint and symptoms.  Patient/family educated about follow-up with PCP.    Patient/family expressed understanding of return precautions and need for follow-up. Patient spoken to regarding all imaging and laboratory results and appropriate follow up for these results. All education provided in verbal form with additional information in written form. Time was allowed for answering of patient questions. Patient discharged.    Emergency Department Medication Summary:   Medications  cefTRIAXone (ROCEPHIN) 1 g in sodium chloride 0.9 % 100 mL IVPB (0 g Intravenous Stopped 06/22/23 2115)         Clinical Impression:  1. Elevated troponin   2. Peripheral edema   3. Cellulitis, unspecified cellulitis site      Discharge   Final Clinical Impression(s) / ED Diagnoses Final diagnoses:  Elevated troponin  Peripheral edema  Cellulitis, unspecified cellulitis site    Rx / DC Orders ED Discharge Orders  Ordered    Ambulatory referral to Cardiology        06/22/23 1945    furosemide (LASIX) 20 MG tablet  Daily        06/22/23 1946    amoxicillin-clavulanate (AUGMENTIN) 875-125 MG tablet  Every 12 hours        06/22/23 2153              Glyn Ade, MD 06/22/23 2154

## 2023-06-23 ENCOUNTER — Ambulatory Visit (HOSPITAL_COMMUNITY)
Admission: RE | Admit: 2023-06-23 | Discharge: 2023-06-23 | Disposition: A | Discharge status: Home / Self Care | Payer: Medicare Other | Source: Ambulatory Visit | Attending: Vascular Surgery | Admitting: Vascular Surgery

## 2023-06-23 ENCOUNTER — Encounter: Payer: Self-pay | Admitting: Vascular Surgery

## 2023-06-23 ENCOUNTER — Ambulatory Visit (INDEPENDENT_AMBULATORY_CARE_PROVIDER_SITE_OTHER): Payer: Medicare Other | Admitting: Vascular Surgery

## 2023-06-23 VITALS — BP 106/55 | HR 64 | Temp 97.9°F | Resp 16 | Ht 70.0 in | Wt 164.0 lb

## 2023-06-23 DIAGNOSIS — I70299 Other atherosclerosis of native arteries of extremities, unspecified extremity: Secondary | ICD-10-CM | POA: Diagnosis not present

## 2023-06-23 DIAGNOSIS — L97909 Non-pressure chronic ulcer of unspecified part of unspecified lower leg with unspecified severity: Secondary | ICD-10-CM | POA: Diagnosis not present

## 2023-06-23 DIAGNOSIS — I739 Peripheral vascular disease, unspecified: Secondary | ICD-10-CM | POA: Diagnosis not present

## 2023-06-23 LAB — VAS US ABI WITH/WO TBI: Left ABI: 0.69

## 2023-06-23 NOTE — Progress Notes (Signed)
REASON FOR VISIT:   Follow-up of peripheral arterial disease  MEDICAL ISSUES:   PERIPHERAL ARTERIAL DISEASE: This patient has stable infrainguinal arterial occlusive disease on the left.  I have encouraged him to stay as active as possible.  He is recovering from the femur fracture on the left.  He does have a prosthesis for his right BKA and once he is more mobile I have encouraged him to continue to use this.  He is not a smoker.  He is on aspirin and a statin.  He would like to be seen on a yearly basis.  I have explained that I will be retiring.  I have ordered follow-up ABIs in 1 year and he will be seen on the PA schedule at that time.  HPI:   Keith Espinoza is a pleasant 82 y.o. male who underwent a right below the knee amputation in March 2022.  His family has requested that we follow his peripheral arterial disease on the left closely understandably.  Since I saw him last, he fell in April and sustained a femur fracture that had to be plated.  Thus he has not been very mobile.  He does have a prosthesis for his BKA but since the injury has not been using it.  He denies any rest pain on the left leg or nonhealing wounds on the left.  He is not a smoker.  He is on aspirin, Plavix, and a statin.  Past Medical History:  Diagnosis Date   Allergic rhinitis    Allergic rhinitis    Arthritis    Basal cell carcinoma 11/01/2019    bcc left chest treatment [place] cx3 75fu    Chronic leg pain    right   Chronic lower back pain    Coronary artery disease    a. Stenting to RCA 2004; staged DES to LAD and Cx 2004. DES to [facility] 2012. b. DES to mCx, PTCA to dCx 11/2011. c. Lateral wall MI 2013 s/p PTCA to distal Cx & DES to mid OM2 11/2011. d. Low risk nuc 04/2014, EF wnl.   COVID-19    Diabetes mellitus    Insulin dependent   Diabetic neuropathy (HCC)    MILD   Diverticulosis    Dysrhythmia    [name] syndrome    Gout    right wrist; right foot; right elbow; have had it since 1970's    H/O hiatal hernia    Heart murmur    History of echocardiogram    aortic sclerosis per echo 12/09 EF 65%, otherwise normal   History of hemorrhoids    BLEEDING   History of kidney stones    h/o   Hypertension    Diagnosed 1995    Myocardial infarction [facility])    Pancreatic pseudocyst    a. s/p remote drainage 2006.   Thrombocytopenia (HCC)    Seen on oldest labs in system from 2004   Vitamin B 12 deficiency    orally replaced    Family History  Problem Relation Age of Onset   Diabetes Mother    Hyperlipidemia Mother    Hypertension Mother    Cancer Father    Hypertension Father    Diabetes Sister    Hypertension Sister    Cancer Brother    Heart attack Neg Hx     SOCIAL HISTORY: Social History   Tobacco Use   Smoking status: Former   Smokeless tobacco: Current    Types: Chew   Tobacco  comments:    quit 60 years ago  Substance Use Topics   Alcohol use: No    Allergies  Allergen Reactions   Simvastatin Other (See Comments)    SEVERE MYALGIAS    Zetia [Ezetimibe] Other (See Comments)    MYALGIAS   Dilaudid [Hydromorphone Hcl] Other (See Comments)    hallucination    Current Outpatient Medications  Medication Sig Dispense Refill   acetaminophen (TYLENOL) 500 MG tablet Take 1,000 mg by mouth every 6 (six) hours as needed (pain).     allopurinol (ZYLOPRIM) 300 MG tablet Take 300 mg by mouth daily.     amLODipine (NORVASC) 10 MG tablet Take 1 tablet (10 mg total) by mouth daily. 30 tablet 0   amoxicillin-clavulanate (AUGMENTIN) 875-125 MG tablet Take 1 tablet by mouth every 12 (twelve) hours. 14 tablet 0   Ascorbic Acid (VITAMIN C PO) Take 1 tablet by mouth at bedtime.     aspirin EC 81 MG tablet Take 81 mg by mouth daily.     Cholecalciferol (VITAMIN D3) 50 MCG (2000 UT) TABS Take 2,000 Units by mouth at bedtime.     clopidogrel (PLAVIX) 75 MG tablet Take 1 tablet (75 mg total) by mouth daily. 30 tablet 0   clotrimazole-betamethasone (LOTRISONE) cream  Apply topically 2 (two) times daily.     Continuous Blood Gluc Sensor (DEXCOM G7 SENSOR) MISC 1 Device by Does not apply route as directed. 9 each 3   Cyanocobalamin (B-12) 2500 MCG TABS Take 2,500 mcg by mouth daily.     empagliflozin (JARDIANCE) 25 MG TABS tablet TAKE 1 TABLET(25 MG) BY MOUTH DAILY 90 tablet 1   furosemide (LASIX) 20 MG tablet Take 1 tablet (20 mg total) by mouth daily for 10 days. 14 tablet 0   gabapentin (NEURONTIN) 300 MG capsule Take 600 mg by mouth at bedtime.     gabapentin (NEURONTIN) 400 MG capsule Take 1 capsule (400 mg total) by mouth 3 (three) times daily. (Patient taking differently: Take 400 mg by mouth daily.) 90 capsule 0   insulin aspart (NOVOLOG FLEXPEN) 100 UNIT/ML FlexPen Max daily 30 units (Patient taking differently: Inject 2-5 Units into the skin daily. Per sliding scale) 30 mL 3   insulin degludec (TRESIBA FLEXTOUCH) 100 UNIT/ML FlexTouch Pen Inject 12 Units into the skin daily. (Patient taking differently: Inject 10 Units into the skin daily.) 15 mL 3   Insulin Pen Needle 32G X 4 MM MISC 1 Device by Does not apply route in the morning, at noon, in the evening, and at bedtime. 400 each 3   isosorbide mononitrate (IMDUR) 60 MG 24 hr tablet Take 1 tablet (60 mg total) by mouth daily. 30 tablet 0   losartan (COZAAR) 25 MG tablet Take 1 tablet (25 mg total) by mouth daily. 30 tablet 0   MAGNESIUM PO Take 1 tablet by mouth at bedtime.     metFORMIN (GLUCOPHAGE) 1000 MG tablet Take 1 tablet (1,000 mg total) by mouth 2 (two) times daily. 180 tablet 3   metoprolol tartrate (LOPRESSOR) 25 MG tablet Take 25 mg by mouth daily.     nitroGLYCERIN (NITROSTAT) 0.4 MG SL tablet Place 0.4 mg under the tongue every 5 (five) minutes x 3 doses as needed for chest pain.      Omega-3 Fatty Acids (FISH OIL) 1200 MG CAPS Take by mouth.     pantoprazole (PROTONIX) 40 MG tablet Take by mouth.     pioglitazone (ACTOS) 30 MG tablet TAKE 1 TABLET(30 MG)  BY MOUTH DAILY 90 tablet 3    polyethylene glycol (MIRALAX) 17 g packet Take 17 g by mouth 2 (two) times daily. (Patient taking differently: Take 17 g by mouth daily as needed for mild constipation or moderate constipation.) 28 each 3   Potassium Chloride ER 20 MEQ TBCR Take 1 tablet by mouth daily.     potassium chloride SA (KLOR-CON) 20 MEQ tablet Take 1 tablet (20 mEq total) by mouth daily. (Patient taking differently: Take 20 mEq by mouth at bedtime.) 20 tablet 0   rosuvastatin (CRESTOR) 20 MG tablet Take 1 tablet (20 mg total) by mouth daily. 30 tablet 0   senna-docusate (SENOKOT-S) 8.6-50 MG tablet Take 1 tablet by mouth at bedtime. (Patient taking differently: Take 1 tablet by mouth 2 (two) times daily.) 30 tablet 2   terazosin (HYTRIN) 5 MG capsule Take 1 capsule (5 mg total) by mouth at bedtime. (Patient taking differently: Take 5 mg by mouth 2 (two) times daily.) 30 capsule 0   traZODone (DESYREL) 50 MG tablet Take by mouth.     Turmeric (QC TUMERIC COMPLEX PO) Take 1 capsule by mouth at bedtime.     No current facility-administered medications for this visit.    REVIEW OF SYSTEMS:  [X]  denotes positive finding, [ ]  denotes negative finding Cardiac  Comments:  Chest pain or chest pressure:    Shortness of breath upon exertion:    Short of breath when lying flat:    Irregular heart rhythm:        Vascular    Pain in calf, thigh, or hip brought on by ambulation:    Pain in feet at night that wakes you up from your sleep:     Blood clot in your veins:    Leg swelling:         Pulmonary    Oxygen at home:    Productive cough:     Wheezing:         Neurologic    Sudden weakness in arms or legs:     Sudden numbness in arms or legs:     Sudden onset of difficulty speaking or slurred speech:    Temporary loss of vision in one eye:     Problems with dizziness:         Gastrointestinal    Blood in stool:     Vomited blood:         Genitourinary    Burning when urinating:     Blood in urine:         Psychiatric    Major depression:         Hematologic    Bleeding problems:    Problems with blood clotting too easily:        Skin    Rashes or ulcers:        Constitutional    Fever or chills:     PHYSICAL EXAM:   Vitals:   06/23/23 0955  BP: (!) 106/55  Pulse: 64  Resp: 16  Temp: 97.9 F (36.6 C)  SpO2: 98%  Weight: 164 lb (74.4 kg)  Height: 5\' 10"  (1.778 m)    GENERAL: The patient is a well-nourished male, in no acute distress. The vital signs are documented above. CARDIAC: There is a regular rate and rhythm.  VASCULAR: I do not detect carotid bruits. His right BKA is healed nicely. I cannot palpate pulses in the left foot.  He has moderate swelling of the left leg as  he underwent plating of a femur fracture in April. PULMONARY: There is good air exchange bilaterally without wheezing or rales. ABDOMEN: Soft and non-tender with normal pitched bowel sounds.  MUSCULOSKELETAL: He has a right BKA. NEUROLOGIC: No focal weakness or paresthesias are detected. SKIN: There are no ulcers or rashes noted. PSYCHIATRIC: The patient has a normal affect.  DATA:    ARTERIAL DOPPLER STUDY: I have independently interpreted his arterial Doppler study today.  This was of the left lower extremity.  (He has a right BKA).  On the right side he has a monophasic posterior tibial and dorsalis pedis signal.  His ABIs 69%.  Toe pressures 43 mmHg.  Waverly Ferrari [facility] [phone]

## 2023-06-24 ENCOUNTER — Other Ambulatory Visit: Payer: Self-pay

## 2023-06-24 DIAGNOSIS — K219 Gastro-esophageal reflux disease without esophagitis: Secondary | ICD-10-CM | POA: Diagnosis not present

## 2023-06-24 DIAGNOSIS — E1165 Type 2 diabetes mellitus with hyperglycemia: Secondary | ICD-10-CM | POA: Diagnosis not present

## 2023-06-24 DIAGNOSIS — Z899 Acquired absence of limb, unspecified: Secondary | ICD-10-CM | POA: Diagnosis not present

## 2023-06-24 DIAGNOSIS — I10 Essential (primary) hypertension: Secondary | ICD-10-CM | POA: Diagnosis not present

## 2023-06-24 DIAGNOSIS — M7989 Other specified soft tissue disorders: Secondary | ICD-10-CM | POA: Diagnosis not present

## 2023-06-24 DIAGNOSIS — I25118 Atherosclerotic heart disease of native coronary artery with other forms of angina pectoris: Secondary | ICD-10-CM | POA: Diagnosis not present

## 2023-06-24 DIAGNOSIS — I739 Peripheral vascular disease, unspecified: Secondary | ICD-10-CM

## 2023-06-24 DIAGNOSIS — I700 Atherosclerosis of aorta: Secondary | ICD-10-CM | POA: Diagnosis not present

## 2023-06-24 DIAGNOSIS — E1151 Type 2 diabetes mellitus with diabetic peripheral angiopathy without gangrene: Secondary | ICD-10-CM | POA: Diagnosis not present

## 2023-06-24 DIAGNOSIS — M109 Gout, unspecified: Secondary | ICD-10-CM | POA: Diagnosis not present

## 2023-06-24 DIAGNOSIS — K922 Gastrointestinal hemorrhage, unspecified: Secondary | ICD-10-CM | POA: Diagnosis not present

## 2023-06-28 DIAGNOSIS — S72452D Displaced supracondylar fracture without intracondylar extension of lower end of left femur, subsequent encounter for closed fracture with routine healing: Secondary | ICD-10-CM | POA: Diagnosis not present

## 2023-06-28 DIAGNOSIS — H353133 Nonexudative age-related macular degeneration, bilateral, advanced atrophic without subfoveal involvement: Secondary | ICD-10-CM | POA: Diagnosis not present

## 2023-06-28 DIAGNOSIS — E113393 Type 2 diabetes mellitus with moderate nonproliferative diabetic retinopathy without macular edema, bilateral: Secondary | ICD-10-CM | POA: Diagnosis not present

## 2023-06-28 DIAGNOSIS — I10 Essential (primary) hypertension: Secondary | ICD-10-CM | POA: Diagnosis not present

## 2023-06-28 DIAGNOSIS — H353221 Exudative age-related macular degeneration, left eye, with active choroidal neovascularization: Secondary | ICD-10-CM | POA: Diagnosis not present

## 2023-06-28 DIAGNOSIS — E1165 Type 2 diabetes mellitus with hyperglycemia: Secondary | ICD-10-CM | POA: Diagnosis not present

## 2023-06-28 DIAGNOSIS — H353211 Exudative age-related macular degeneration, right eye, with active choroidal neovascularization: Secondary | ICD-10-CM | POA: Diagnosis not present

## 2023-06-28 DIAGNOSIS — M978XXD Periprosthetic fracture around other internal prosthetic joint, subsequent encounter: Secondary | ICD-10-CM | POA: Diagnosis not present

## 2023-06-28 DIAGNOSIS — Z89511 Acquired absence of right leg below knee: Secondary | ICD-10-CM | POA: Diagnosis not present

## 2023-06-28 DIAGNOSIS — Z794 Long term (current) use of insulin: Secondary | ICD-10-CM | POA: Diagnosis not present

## 2023-06-29 DIAGNOSIS — M978XXD Periprosthetic fracture around other internal prosthetic joint, subsequent encounter: Secondary | ICD-10-CM | POA: Diagnosis not present

## 2023-06-29 DIAGNOSIS — E1165 Type 2 diabetes mellitus with hyperglycemia: Secondary | ICD-10-CM | POA: Diagnosis not present

## 2023-06-29 DIAGNOSIS — S72452D Displaced supracondylar fracture without intracondylar extension of lower end of left femur, subsequent encounter for closed fracture with routine healing: Secondary | ICD-10-CM | POA: Diagnosis not present

## 2023-06-29 DIAGNOSIS — I10 Essential (primary) hypertension: Secondary | ICD-10-CM | POA: Diagnosis not present

## 2023-06-29 DIAGNOSIS — H353221 Exudative age-related macular degeneration, left eye, with active choroidal neovascularization: Secondary | ICD-10-CM | POA: Diagnosis not present

## 2023-06-29 DIAGNOSIS — Z794 Long term (current) use of insulin: Secondary | ICD-10-CM | POA: Diagnosis not present

## 2023-06-29 DIAGNOSIS — Z89511 Acquired absence of right leg below knee: Secondary | ICD-10-CM | POA: Diagnosis not present

## 2023-07-03 ENCOUNTER — Encounter (HOSPITAL_COMMUNITY): Payer: Self-pay | Admitting: Emergency Medicine

## 2023-07-03 ENCOUNTER — Other Ambulatory Visit: Payer: Self-pay

## 2023-07-03 ENCOUNTER — Emergency Department (HOSPITAL_COMMUNITY): Payer: Medicare Other

## 2023-07-03 ENCOUNTER — Inpatient Hospital Stay (HOSPITAL_COMMUNITY)
Admission: EM | Admit: 2023-07-03 | Discharge: 2023-07-10 | DRG: 291 | Discharge status: Against Medical Advice | Payer: Medicare Other | Attending: Family Medicine | Admitting: Family Medicine

## 2023-07-03 DIAGNOSIS — I255 Ischemic cardiomyopathy: Secondary | ICD-10-CM

## 2023-07-03 DIAGNOSIS — K449 Diaphragmatic hernia without obstruction or gangrene: Secondary | ICD-10-CM | POA: Diagnosis present

## 2023-07-03 DIAGNOSIS — R609 Edema, unspecified: Secondary | ICD-10-CM

## 2023-07-03 DIAGNOSIS — I480 Paroxysmal atrial fibrillation: Secondary | ICD-10-CM | POA: Diagnosis not present

## 2023-07-03 DIAGNOSIS — I4891 Unspecified atrial fibrillation: Secondary | ICD-10-CM | POA: Diagnosis not present

## 2023-07-03 DIAGNOSIS — Z794 Long term (current) use of insulin: Secondary | ICD-10-CM | POA: Diagnosis not present

## 2023-07-03 DIAGNOSIS — I5189 Other ill-defined heart diseases: Secondary | ICD-10-CM

## 2023-07-03 DIAGNOSIS — E1165 Type 2 diabetes mellitus with hyperglycemia: Secondary | ICD-10-CM | POA: Diagnosis not present

## 2023-07-03 DIAGNOSIS — Z8673 Personal history of transient ischemic attack (TIA), and cerebral infarction without residual deficits: Secondary | ICD-10-CM | POA: Diagnosis not present

## 2023-07-03 DIAGNOSIS — I2510 Atherosclerotic heart disease of native coronary artery without angina pectoris: Secondary | ICD-10-CM | POA: Diagnosis not present

## 2023-07-03 DIAGNOSIS — N1831 Chronic kidney disease, stage 3a: Secondary | ICD-10-CM | POA: Diagnosis not present

## 2023-07-03 DIAGNOSIS — S72452D Displaced supracondylar fracture without intracondylar extension of lower end of left femur, subsequent encounter for closed fracture with routine healing: Secondary | ICD-10-CM | POA: Diagnosis not present

## 2023-07-03 DIAGNOSIS — R0989 Other specified symptoms and signs involving the circulatory and respiratory systems: Secondary | ICD-10-CM | POA: Diagnosis not present

## 2023-07-03 DIAGNOSIS — K31819 Angiodysplasia of stomach and duodenum without bleeding: Secondary | ICD-10-CM

## 2023-07-03 DIAGNOSIS — N401 Enlarged prostate with lower urinary tract symptoms: Secondary | ICD-10-CM | POA: Diagnosis not present

## 2023-07-03 DIAGNOSIS — R0602 Shortness of breath: Secondary | ICD-10-CM | POA: Diagnosis present

## 2023-07-03 DIAGNOSIS — Z85828 Personal history of other malignant neoplasm of skin: Secondary | ICD-10-CM

## 2023-07-03 DIAGNOSIS — K31811 Angiodysplasia of stomach and duodenum with bleeding: Secondary | ICD-10-CM | POA: Diagnosis present

## 2023-07-03 DIAGNOSIS — Z66 Do not resuscitate: Secondary | ICD-10-CM | POA: Diagnosis not present

## 2023-07-03 DIAGNOSIS — R600 Localized edema: Secondary | ICD-10-CM | POA: Insufficient documentation

## 2023-07-03 DIAGNOSIS — Z96652 Presence of left artificial knee joint: Secondary | ICD-10-CM | POA: Diagnosis present

## 2023-07-03 DIAGNOSIS — I509 Heart failure, unspecified: Secondary | ICD-10-CM

## 2023-07-03 DIAGNOSIS — I10 Essential (primary) hypertension: Secondary | ICD-10-CM | POA: Diagnosis not present

## 2023-07-03 DIAGNOSIS — I5021 Acute systolic (congestive) heart failure: Secondary | ICD-10-CM | POA: Diagnosis not present

## 2023-07-03 DIAGNOSIS — Z7984 Long term (current) use of oral hypoglycemic drugs: Secondary | ICD-10-CM

## 2023-07-03 DIAGNOSIS — D509 Iron deficiency anemia, unspecified: Principal | ICD-10-CM | POA: Diagnosis present

## 2023-07-03 DIAGNOSIS — D62 Acute posthemorrhagic anemia: Secondary | ICD-10-CM | POA: Diagnosis not present

## 2023-07-03 DIAGNOSIS — D649 Anemia, unspecified: Secondary | ICD-10-CM

## 2023-07-03 DIAGNOSIS — Z7982 Long term (current) use of aspirin: Secondary | ICD-10-CM

## 2023-07-03 DIAGNOSIS — K2901 Acute gastritis with bleeding: Secondary | ICD-10-CM | POA: Diagnosis not present

## 2023-07-03 DIAGNOSIS — Z79899 Other long term (current) drug therapy: Secondary | ICD-10-CM

## 2023-07-03 DIAGNOSIS — Z049 Encounter for examination and observation for unspecified reason: Secondary | ICD-10-CM | POA: Diagnosis not present

## 2023-07-03 DIAGNOSIS — I110 Hypertensive heart disease with heart failure: Secondary | ICD-10-CM | POA: Diagnosis not present

## 2023-07-03 DIAGNOSIS — N400 Enlarged prostate without lower urinary tract symptoms: Secondary | ICD-10-CM | POA: Diagnosis present

## 2023-07-03 DIAGNOSIS — I471 Supraventricular tachycardia, unspecified: Secondary | ICD-10-CM

## 2023-07-03 DIAGNOSIS — I5020 Unspecified systolic (congestive) heart failure: Secondary | ICD-10-CM | POA: Diagnosis not present

## 2023-07-03 DIAGNOSIS — I252 Old myocardial infarction: Secondary | ICD-10-CM | POA: Diagnosis not present

## 2023-07-03 DIAGNOSIS — Z89421 Acquired absence of other right toe(s): Secondary | ICD-10-CM

## 2023-07-03 DIAGNOSIS — H353132 Nonexudative age-related macular degeneration, bilateral, intermediate dry stage: Secondary | ICD-10-CM | POA: Diagnosis not present

## 2023-07-03 DIAGNOSIS — Z23 Encounter for immunization: Secondary | ICD-10-CM

## 2023-07-03 DIAGNOSIS — Z955 Presence of coronary angioplasty implant and graft: Secondary | ICD-10-CM

## 2023-07-03 DIAGNOSIS — K2971 Gastritis, unspecified, with bleeding: Secondary | ICD-10-CM | POA: Diagnosis not present

## 2023-07-03 DIAGNOSIS — E876 Hypokalemia: Secondary | ICD-10-CM | POA: Diagnosis present

## 2023-07-03 DIAGNOSIS — D696 Thrombocytopenia, unspecified: Secondary | ICD-10-CM | POA: Diagnosis present

## 2023-07-03 DIAGNOSIS — R9389 Abnormal findings on diagnostic imaging of other specified body structures: Secondary | ICD-10-CM | POA: Diagnosis not present

## 2023-07-03 DIAGNOSIS — E1122 Type 2 diabetes mellitus with diabetic chronic kidney disease: Secondary | ICD-10-CM | POA: Diagnosis not present

## 2023-07-03 DIAGNOSIS — T148XXA Other injury of unspecified body region, initial encounter: Secondary | ICD-10-CM

## 2023-07-03 DIAGNOSIS — M1990 Unspecified osteoarthritis, unspecified site: Secondary | ICD-10-CM | POA: Diagnosis present

## 2023-07-03 DIAGNOSIS — E1142 Type 2 diabetes mellitus with diabetic polyneuropathy: Secondary | ICD-10-CM | POA: Diagnosis not present

## 2023-07-03 DIAGNOSIS — R5381 Other malaise: Secondary | ICD-10-CM | POA: Diagnosis not present

## 2023-07-03 DIAGNOSIS — K219 Gastro-esophageal reflux disease without esophagitis: Secondary | ICD-10-CM | POA: Diagnosis not present

## 2023-07-03 DIAGNOSIS — E1151 Type 2 diabetes mellitus with diabetic peripheral angiopathy without gangrene: Secondary | ICD-10-CM | POA: Diagnosis present

## 2023-07-03 DIAGNOSIS — E785 Hyperlipidemia, unspecified: Secondary | ICD-10-CM | POA: Diagnosis present

## 2023-07-03 DIAGNOSIS — R195 Other fecal abnormalities: Secondary | ICD-10-CM | POA: Diagnosis not present

## 2023-07-03 DIAGNOSIS — R0609 Other forms of dyspnea: Secondary | ICD-10-CM | POA: Diagnosis not present

## 2023-07-03 DIAGNOSIS — L89151 Pressure ulcer of sacral region, stage 1: Secondary | ICD-10-CM | POA: Diagnosis present

## 2023-07-03 DIAGNOSIS — E7800 Pure hypercholesterolemia, unspecified: Secondary | ICD-10-CM | POA: Diagnosis not present

## 2023-07-03 DIAGNOSIS — N179 Acute kidney failure, unspecified: Secondary | ICD-10-CM | POA: Diagnosis not present

## 2023-07-03 DIAGNOSIS — M109 Gout, unspecified: Secondary | ICD-10-CM | POA: Diagnosis not present

## 2023-07-03 DIAGNOSIS — G4700 Insomnia, unspecified: Secondary | ICD-10-CM | POA: Diagnosis not present

## 2023-07-03 DIAGNOSIS — I4510 Unspecified right bundle-branch block: Secondary | ICD-10-CM | POA: Diagnosis present

## 2023-07-03 DIAGNOSIS — I130 Hypertensive heart and chronic kidney disease with heart failure and stage 1 through stage 4 chronic kidney disease, or unspecified chronic kidney disease: Secondary | ICD-10-CM | POA: Diagnosis not present

## 2023-07-03 DIAGNOSIS — Z89511 Acquired absence of right leg below knee: Secondary | ICD-10-CM | POA: Diagnosis not present

## 2023-07-03 DIAGNOSIS — K5730 Diverticulosis of large intestine without perforation or abscess without bleeding: Secondary | ICD-10-CM

## 2023-07-03 DIAGNOSIS — E871 Hypo-osmolality and hyponatremia: Secondary | ICD-10-CM | POA: Diagnosis not present

## 2023-07-03 DIAGNOSIS — Z8249 Family history of ischemic heart disease and other diseases of the circulatory system: Secondary | ICD-10-CM

## 2023-07-03 DIAGNOSIS — Z809 Family history of malignant neoplasm, unspecified: Secondary | ICD-10-CM

## 2023-07-03 DIAGNOSIS — Z5329 Procedure and treatment not carried out because of patient's decision for other reasons: Secondary | ICD-10-CM | POA: Diagnosis present

## 2023-07-03 DIAGNOSIS — Z7902 Long term (current) use of antithrombotics/antiplatelets: Secondary | ICD-10-CM

## 2023-07-03 DIAGNOSIS — I2583 Coronary atherosclerosis due to lipid rich plaque: Secondary | ICD-10-CM | POA: Diagnosis not present

## 2023-07-03 DIAGNOSIS — I959 Hypotension, unspecified: Secondary | ICD-10-CM

## 2023-07-03 DIAGNOSIS — Z87891 Personal history of nicotine dependence: Secondary | ICD-10-CM

## 2023-07-03 DIAGNOSIS — N138 Other obstructive and reflux uropathy: Secondary | ICD-10-CM | POA: Diagnosis not present

## 2023-07-03 DIAGNOSIS — Z885 Allergy status to narcotic agent status: Secondary | ICD-10-CM

## 2023-07-03 DIAGNOSIS — Z888 Allergy status to other drugs, medicaments and biological substances status: Secondary | ICD-10-CM

## 2023-07-03 DIAGNOSIS — L8990 Pressure ulcer of unspecified site, unspecified stage: Secondary | ICD-10-CM | POA: Insufficient documentation

## 2023-07-03 DIAGNOSIS — K2970 Gastritis, unspecified, without bleeding: Secondary | ICD-10-CM | POA: Diagnosis not present

## 2023-07-03 DIAGNOSIS — R918 Other nonspecific abnormal finding of lung field: Secondary | ICD-10-CM | POA: Diagnosis not present

## 2023-07-03 DIAGNOSIS — I5023 Acute on chronic systolic (congestive) heart failure: Secondary | ICD-10-CM | POA: Diagnosis not present

## 2023-07-03 DIAGNOSIS — R54 Age-related physical debility: Secondary | ICD-10-CM | POA: Diagnosis present

## 2023-07-03 DIAGNOSIS — M9702XD Periprosthetic fracture around internal prosthetic left hip joint, subsequent encounter: Secondary | ICD-10-CM | POA: Diagnosis not present

## 2023-07-03 DIAGNOSIS — H353211 Exudative age-related macular degeneration, right eye, with active choroidal neovascularization: Secondary | ICD-10-CM | POA: Diagnosis not present

## 2023-07-03 DIAGNOSIS — I5043 Acute on chronic combined systolic (congestive) and diastolic (congestive) heart failure: Secondary | ICD-10-CM | POA: Diagnosis not present

## 2023-07-03 DIAGNOSIS — Z993 Dependence on wheelchair: Secondary | ICD-10-CM | POA: Diagnosis not present

## 2023-07-03 DIAGNOSIS — Z602 Problems related to living alone: Secondary | ICD-10-CM | POA: Diagnosis not present

## 2023-07-03 LAB — COMPREHENSIVE METABOLIC PANEL
ALT: 12 U/L (ref 0–44)
AST: 14 U/L — ABNORMAL LOW (ref 15–41)
Albumin: 2.3 g/dL — ABNORMAL LOW (ref 3.5–5.0)
Alkaline Phosphatase: 72 U/L (ref 38–126)
Anion gap: 11 (ref 5–15)
BUN: 24 mg/dL — ABNORMAL HIGH (ref 8–23)
CO2: 26 mmol/L (ref 22–32)
Calcium: 8.6 mg/dL — ABNORMAL LOW (ref 8.9–10.3)
Chloride: 99 mmol/L (ref 98–111)
Creatinine, Ser: 1.36 mg/dL — ABNORMAL HIGH (ref 0.61–1.24)
GFR, Estimated: 52 mL/min — ABNORMAL LOW (ref >60)
Glucose, Bld: 154 mg/dL — ABNORMAL HIGH (ref 70–99)
Potassium: 3.7 mmol/L (ref 3.5–5.1)
Sodium: 136 mmol/L (ref 135–145)
Total Bilirubin: 0.6 mg/dL (ref 0.3–1.2)
Total Protein: 5.1 g/dL — ABNORMAL LOW (ref 6.5–8.1)

## 2023-07-03 LAB — CBC WITH DIFFERENTIAL/PLATELET
Abs Immature Granulocytes: 0.03 10*3/uL (ref 0.00–0.07)
Basophils Absolute: 0 10*3/uL (ref 0.0–0.1)
Basophils Relative: 0 %
Eosinophils Absolute: 0.2 10*3/uL (ref 0.0–0.5)
Eosinophils Relative: 4 %
HCT: 23.8 % — ABNORMAL LOW (ref 39.0–52.0)
Hemoglobin: 7.2 g/dL — ABNORMAL LOW (ref 13.0–17.0)
Immature Granulocytes: 1 %
Lymphocytes Relative: 21 %
Lymphs Abs: 1.2 10*3/uL (ref 0.7–4.0)
MCH: 29.1 pg (ref 26.0–34.0)
MCHC: 30.3 g/dL (ref 30.0–36.0)
MCV: 96.4 fL (ref 80.0–100.0)
Monocytes Absolute: 0.4 10*3/uL (ref 0.1–1.0)
Monocytes Relative: 7 %
Neutro Abs: 3.8 10*3/uL (ref 1.7–7.7)
Neutrophils Relative %: 67 %
Platelets: 102 10*3/uL — ABNORMAL LOW (ref 150–400)
RBC: 2.47 MIL/uL — ABNORMAL LOW (ref 4.22–5.81)
RDW: 17.8 % — ABNORMAL HIGH (ref 11.5–15.5)
Smear Review: NORMAL
WBC: 5.6 10*3/uL (ref 4.0–10.5)
nRBC: 0 % (ref 0.0–0.2)

## 2023-07-03 LAB — TROPONIN I (HIGH SENSITIVITY)
Troponin I (High Sensitivity): 19 ng/L — ABNORMAL HIGH (ref <18)
Troponin I (High Sensitivity): 22 ng/L — ABNORMAL HIGH (ref <18)

## 2023-07-03 LAB — BRAIN NATRIURETIC PEPTIDE: B Natriuretic Peptide: 1247.5 pg/mL — ABNORMAL HIGH (ref 0.0–100.0)

## 2023-07-03 LAB — I-STAT CG4 LACTIC ACID, ED: Lactic Acid, Venous: 1.1 mmol/L (ref 0.5–1.9)

## 2023-07-03 LAB — PREPARE RBC (CROSSMATCH)

## 2023-07-03 LAB — CBG MONITORING, ED: Glucose-Capillary: 158 mg/dL — ABNORMAL HIGH (ref 70–99)

## 2023-07-03 MED ORDER — TAMSULOSIN HCL 0.4 MG PO CAPS
0.4000 mg | ORAL_CAPSULE | Freq: Every day | ORAL | Status: DC
  Administered 2023-07-03 – 2023-07-09 (×7): 0.4 mg via ORAL
  Filled 2023-07-03 (×7): qty 1

## 2023-07-03 MED ORDER — ROSUVASTATIN CALCIUM 20 MG PO TABS
20.0000 mg | ORAL_TABLET | Freq: Every day | ORAL | Status: DC
  Administered 2023-07-03 – 2023-07-10 (×7): 20 mg via ORAL
  Filled 2023-07-03 (×8): qty 1

## 2023-07-03 MED ORDER — PANTOPRAZOLE SODIUM 40 MG IV SOLR
40.0000 mg | INTRAVENOUS | Status: DC
  Administered 2023-07-03: 40 mg via INTRAVENOUS
  Filled 2023-07-03: qty 10

## 2023-07-03 MED ORDER — GABAPENTIN 300 MG PO CAPS
600.0000 mg | ORAL_CAPSULE | Freq: Every day | ORAL | Status: DC
  Administered 2023-07-03 – 2023-07-09 (×7): 600 mg via ORAL
  Filled 2023-07-03 (×7): qty 2

## 2023-07-03 MED ORDER — PANTOPRAZOLE SODIUM 40 MG PO TBEC
40.0000 mg | DELAYED_RELEASE_TABLET | Freq: Every day | ORAL | Status: DC
  Administered 2023-07-03: 40 mg via ORAL
  Filled 2023-07-03: qty 1

## 2023-07-03 MED ORDER — FUROSEMIDE 10 MG/ML IJ SOLN
40.0000 mg | Freq: Once | INTRAMUSCULAR | Status: AC
  Administered 2023-07-04: 40 mg via INTRAVENOUS
  Filled 2023-07-03: qty 4

## 2023-07-03 MED ORDER — INSULIN ASPART 100 UNIT/ML IJ SOLN
0.0000 [IU] | Freq: Three times a day (TID) | INTRAMUSCULAR | Status: DC
  Administered 2023-07-03: 2 [IU] via SUBCUTANEOUS
  Administered 2023-07-04 (×2): 3 [IU] via SUBCUTANEOUS
  Administered 2023-07-04: 5 [IU] via SUBCUTANEOUS
  Administered 2023-07-05: 9 [IU] via SUBCUTANEOUS
  Administered 2023-07-05: 3 [IU] via SUBCUTANEOUS
  Administered 2023-07-06: 9 [IU] via SUBCUTANEOUS
  Administered 2023-07-06: 3 [IU] via SUBCUTANEOUS
  Administered 2023-07-06: 2 [IU] via SUBCUTANEOUS
  Administered 2023-07-07 (×2): 3 [IU] via SUBCUTANEOUS
  Administered 2023-07-07: 7 [IU] via SUBCUTANEOUS
  Administered 2023-07-08: 2 [IU] via SUBCUTANEOUS

## 2023-07-03 MED ORDER — SODIUM CHLORIDE 0.9% IV SOLUTION: Freq: Once | INTRAVENOUS | Status: DC

## 2023-07-03 MED ORDER — FUROSEMIDE 10 MG/ML IJ SOLN
40.0000 mg | Freq: Once | INTRAMUSCULAR | Status: AC
  Administered 2023-07-03: 40 mg via INTRAVENOUS
  Filled 2023-07-03: qty 4

## 2023-07-03 MED ORDER — GABAPENTIN 400 MG PO CAPS
400.0000 mg | ORAL_CAPSULE | Freq: Every day | ORAL | Status: DC
  Administered 2023-07-04 – 2023-07-10 (×6): 400 mg via ORAL
  Filled 2023-07-03 (×7): qty 1

## 2023-07-03 MED ORDER — INSULIN GLARGINE-YFGN 100 UNIT/ML ~~LOC~~ SOLN
5.0000 [IU] | Freq: Every day | SUBCUTANEOUS | Status: DC
  Administered 2023-07-04 – 2023-07-05 (×2): 5 [IU] via SUBCUTANEOUS
  Filled 2023-07-03 (×2): qty 0.05

## 2023-07-03 NOTE — Assessment & Plan Note (Signed)
Platelets 102 in the ED. Hx thrombocytopenia. 116 on 9/18. - am CBC

## 2023-07-03 NOTE — Hospital Course (Addendum)
Keith Espinoza is a 82 y.o.male with a history of anemia, HTN, T2DM, CAD with stents who was admitted to the [facility] at [facility] for shortness of breath. His hospital course is detailed below:  Dyspnea  CHF exacerbation  Patient presented for 3 weeks of dyspnea, orthopnea, and generalized weakness. Upon arrival to ED, patient satting well and trops trended flat. EKG with T wave inversions in Lead III, aVF, V3, consistent with prior EKG. Elevated BNP 1247.5 and CXR with possible small R pleural effusion. On exam, patient with bibasilar crackles and pitting edema of L leg. Patient given Lasix diuresis for a total of *** days with adequate urine output and symptomatic improvement. Echo demonstrated LVEF 40-45% with G1DD and mildly dilated left atrium. Overall, patient's presentation likely secondary to acute CHF exacerbation.  By time of discharge patient was able to comfortably lay flat and maintain appropriate O2 saturation ***. Patient was discharged on *** mg of Lasix PO.  Anemia Patient worked up for hematemesis 3 weeks prior to admission with unremarkable EGD. Reported dark stools since last discharge, no bleeding signs noted during latest admission. At presentation, patient with Hgb 7.2. After receiving 1u pRBC, pt's hemoglobin remained stable around 9-10, which appears to be his baseline. Given ongoing fluctuation and incomplete characterization of possible sources of bleeding, GI was consulted for further workup and he had a colonoscopy. Received IV iron x1.  On 10/4 patient underwent GI workup with colonoscopy and EGD.  Colonoscopy showed diverticulosis, however no sites of bleeding.  Subsequent EGD was performed which showed small angioectasia with active bleeding as well as areas of mucosal bruising within the gastric body.  All were treated with APC for hemostasis. ***  Edema Presented with generalized edema since hospital discharge 3 weeks prior to admission. Marked pitting  edema of entire L leg, that patient believes has remained unchanged since femur fracture repair in August. Patient received lasix diuresis with adequate urine output, and was given compression stockings ***. By time of discharge, patient's L leg ***  Other chronic conditions were medically managed with home medications and formulary alternatives as necessary: HTN: held amlodipine 10 iso low BP HLD: Crestor 20 mg GERD: Protonix 40 mg BPH: Flomax 0.4mg , held home terazosin iso low BP CAD: ASA 81 mg, held plavix 75 mg iso bleed risk Neuropathy: Gabapentin morning 400 mg, nighttime 600 mg T2DM: Semglee 5 u daily with SSI Amputated R leg: discussed with pt's ortho clinic about stump blister/refitting prosthesis   PCP Follow-up Recommendations: Reassess BP medication management for improved BP control  Outpatient ortho follow up regarding refitting prosthesis for better fit ***??. Outpatient iron infusion for chronic iron deficiency anemia. GI recommends Protonix 40 mg p.o. twice daily for 8 weeks, then reduce to 40 mg daily for ongoing gastric prophylaxis GI recommends follow-up to repeat upper endoscopy as needed if concern for rebleeding outpatient Per GI if planning to continue Plavix, they would recommend continuing PPI long-term as well. Cards recommends outpatient 30-day event monitor to assess for any A-fib  Consider addition of GDMT as tolerated

## 2023-07-03 NOTE — Assessment & Plan Note (Signed)
Takes 12U Tresiba in AM, Jardiance 25mg  every day, Metformin 1000mg  BID, Pioglitazone 30mg  every day at home. - Holding home meds - Semglee 5U every day - SSI

## 2023-07-03 NOTE — H&P (Cosign Needed Addendum)
Hospital Admission History and Physical Service Pager: [phone]  Patient name: Keith Espinoza Medical record number: [id number] Date of Birth: [birth year] Age: 82 y.o. Gender: male  Primary Care Provider: Emilio Aspen, MD Consultants: none Code Status: DNR/DNI  Preferred Emergency Contact:  Contact Information     Name Relation Home Work Mobile   Tilley,[name] Daughter [phone]  [phone]      Other Contacts     Name Relation Home Work Mobile   Smith,Tiffany Granddaughter [phone]         Chief Complaint: Shortness of Breath  Assessment and Plan: Keith Espinoza is a 82 y.o. male with past medical history anemia, hypertension, T2DM, HLD, GERD, CAD, BPH presenting with shortness of breath.  Suspect this to be secondary to acute CHF exacerbation.  Last echo in July showed grade 1 diastolic dysfunction, will plan to repeat echo.  Other differentials include arrhythmia, pneumonia, PE, pleural effusion, anemia.  Chest x-ray concerning for right pleural effusion.  Cardiac telemetry to monitor for arrhythmia.  Patient afebrile without leukocytosis, less suspicious for pneumonia.  Less suspicious for PE given not tachycardic, Wells score 0.  Hemoglobin 7.2, will give 1 unit packed red blood cells and repeat CBC. Assessment & Plan Shortness of breath Generalized edema since hospital discharge 3 weeks ago in addition to orthopnea.  Echo 04/24/2023 with EF 60 to 65% and grade 1 diastolic dysfunction. Suspect SOB in setting of acute CHF exacerbation. BNP 1247.5. Trops trended flat.  - admitted to FMTS, progressive, Dr. Deirdre Priest attending - S/p lasix 40mg  in the ED, will monitor output before additional doses - hold pioglitazone - repeat echo - strict I&Os - daily weights - orthostatics - am BMP, Mag - PT/OT Iron deficiency anemia, unspecified iron deficiency anemia type Hgb 7.2, 9.1 on 9/18. Appears it has been around 9 since July. Had reported  unremarkable EGD 3 weeks ago while hospitalized for hematemesis, but has had dark stools since discharge. Platelets 102.  - 1U PRBCs - Transfusion threshold >8 - holding home aspirin 81mg , plavix 75mg  - Consider GI consult for melena and anemia - am CBC - IV PPI Hypertension, unspecified type On amlodipine 10mg  every day at home.  - holding amlodipine 10mg  and terazosin 5mg  at bedtime (for BPH) due to soft pressures in the ED - continue to monitor VS Type 2 diabetes mellitus with diabetic polyneuropathy, with long-term current use of insulin (HCC) Takes 12U Tresiba in AM, Jardiance 25mg  every day, Metformin 1000mg  BID, Pioglitazone 30mg  every day at home. - Holding home meds - Semglee 5U every day - SSI Thrombocytopenia (HCC) Platelets 102 in the ED. Hx thrombocytopenia. 116 on 9/18. - am CBC  Chronic and Stable Problems:  HLD - rosuvastatin 20mg  QD GERD - protonix 40mg  QD BPH - holding terazosin 5mg , added Flomax  FEN/GI: carb modified VTE Prophylaxis: SCDs >>concern for active bleed   Disposition: Progressive   History of Present Illness:  Keith Espinoza is a 82 y.o. male presenting with shortness of breath and generalized edema x 3 weeks.  Daughter states that his symptoms worsened last night prompting them to come to the ED.  Daughter reports that patient had a left femur fracture that was repaired last month, he then began having hematemesis and went back to [facility] where he had an EGD that was unremarkable.  Daughter states his hemoglobin was 9.4 on discharge at that time.  She reports that he has not yet hematemesis since discharge, however she  has noticed that his stools appear darker and intermittent blood in his stool. States that ever since being discharged from the hospital he has been shortness of breath and "going downhill".  Reports that he has been unable to fit his prosthesis on his stump due to the swelling and that his shirts feel tight.  Patient  states that he is tired and reports orthopnea but does not have pain anywhere. Daughter notes that he recently finished a course of Augmentin for a cat scratch on his left arm. Patient denies chest pain, shortness of breath, abdominal pain, fever, chills.  In the ED, EMS reported a low O2 sat on their arrival to the scene, however he was saturating normally throughout his time in the ED.  Trops trended flat. Creatinine 1.36. BNP 1247.5.  Hemoglobin 7.2.  Platelets 102.  Patient given 40 mg Lasix.  Chest x-ray still has not been read by radiologist, however appears patient has right pleural effusion and widened mediastinum, effusion appears new when compared to his last chest x-ray on 9/18.  Review Of Systems: Per HPI with the following additions: none  Pertinent Past Medical History: CAD w/ 4 stents MI IDDM Gilbert syndrome HTN HLD Thrombocytopenia Gout  Remainder reviewed in history tab.   Pertinent Past Surgical History: R BKA Cholecystectomy Coronary stent x4 Back surgery   Remainder reviewed in history tab.  Pertinent Social History: Tobacco use: Former - quit 60 years ago Alcohol use: no Other Substance use: no Lives at his home, has a Comptroller during the day and daughter stays with him at night  Pertinent Family History: Mother - DM, HLD, HTN Father - cancer, HTN  Remainder reviewed in history tab.   Important Outpatient Medications: Allopurinol 300mg  every day Aspirin 81mg , Plavix 75mg  Gabapentin 400mg  in the morning and 600mg  at bedtime Imdur 60mg  every day Trazodone 50mg  QD  Remainder reviewed in medication history.   Objective: BP 99/75   Pulse (!) 57   Temp 98.2 F (36.8 C) (Oral)   Resp 16   Ht 5\' 10"  (1.778 m)   Wt 74.4 kg   SpO2 100%   BMI 23.53 kg/m  Exam: General: No acute distress, laying in bed Eyes: EOMI ENTM: Moist mucous membranes Neck: No JVD Cardiovascular: RRR, no murmurs Respiratory: Crackles RLL. Otherwise clear to auscultation.  No increased work of breathing on room air.  Gastrointestinal: Soft, non-tender, distended MSK: R BKA, stump with intact blister. LLE 2+ pitting edema with scattered ecchymosis. LUE mildly more swollen than RUE, LUE with bandaid over previous cat scratch, no signs of infection, no warmth or drainage noted to extremity.  Derm: Generalized pallor Neuro: A&Ox3. Psych: Normal affect  Labs:  CBC BMET  Recent Labs  Lab 07/03/23 1305  WBC 5.6  HGB 7.2*  HCT 23.8*  PLT 102*   Recent Labs  Lab 07/03/23 1305  NA 136  K 3.7  CL 99  CO2 26  BUN 24*  CREATININE 1.36*  GLUCOSE 154*  CALCIUM 8.6*    BNP 1247.5 Lactic acid 1.1 Initial troponin 19  EKG: EKG NSR at 64bpm, T wave inversions Lead III, aVF, V3 consistent with prior EKG. Qtc .    Imaging Studies Performed: CXR - R sided pleural effusion. Widened mediastinum. Formal radiology read pending.    Everhart, Tamala Julian, DO 07/03/2023, 5:28 PM PGY-1, [facility]  FPTS Intern pager: [phone], text pages welcome Secure chat group [facility] Teaching Service   Upper Level Addendum:  I  have seen and evaluated this patient along with Dr. Rexene Alberts and reviewed the above note, making necessary revisions as appropriate.  I agree with the medical decision making and physical exam as noted above.  Alfredo Martinez, MD PGY-3 [facility] Residency

## 2023-07-03 NOTE — ED Notes (Signed)
Pt provided with [place] sandwich and diet cola.

## 2023-07-03 NOTE — Progress Notes (Signed)
FMTS Brief Progress Note  S:Went bedside w/ Dr. Fatima Blank to see patient. Patient sleeping comfortably, did not disturb, PRBC nearly halfway transfused   O: BP (!) 93/53 (BP Location: Right Arm)   Pulse (!) 58   Temp (!) 97.4 F (36.3 C) (Oral)   Resp 13   Ht 5\' 10"  (1.778 m)   Wt 74.4 kg   SpO2 100%   BMI 23.53 kg/m   General:  NAD, resting comfortably, elder, frail  A/P: CHF exacerbation Patient here for CHF, plan's per day team  Anemia Patient w/ Hgb 7.2 on admission, transfusion threshold 8. Patient receiving transfusion. Plans for CBC post transfusion, RN to notify team when complete.  -Post transfusion CBC  - Orders reviewed. Labs for AM ordered, which was adjusted as needed.   Bess Kinds, MD 07/03/2023, 8:34 PM PGY-3, [facility] Night Resident  Please page [phone] with questions.

## 2023-07-03 NOTE — ED Provider Notes (Signed)
[place] EMERGENCY DEPARTMENT AT [facility] Provider Note   CSN: [id number] Arrival date & time: 07/03/23  1243     History  Chief Complaint  Patient presents with   Shortness of Breath    Kotaro Buer is a 82 y.o. male.  82 year old male here today for shortness of breath and weakness.  Patient is here with his daughter at bedside helps provide history.  Patient reportedly was hospitalized at [facility] 3 weeks ago after an episode of hematemesis.  While there, patient did not have any additional GI testing.  He has had increased swelling over the last couple of weeks, per chart review, patient has history of ischemic heart disease which has not been deemed not intervenable.  Patient says that his leg swelling has prevented him from being able to wear his prosthetic leg.   Shortness of Breath      Home Medications Prior to Admission medications   Medication Sig Start Date End Date Taking? Authorizing Provider  acetaminophen (TYLENOL) 500 MG tablet Take 1,000 mg by mouth every 6 (six) hours as needed (pain).    [provider]  allopurinol (ZYLOPRIM) 300 MG tablet Take 300 mg by mouth daily.    [provider]  amLODipine (NORVASC) 10 MG tablet Take 1 tablet (10 mg total) by mouth daily. 04/28/23   Azucena Fallen, MD  amoxicillin-clavulanate (AUGMENTIN) 875-125 MG tablet Take 1 tablet by mouth every 12 (twelve) hours. 06/22/23   Glyn Ade, MD  Ascorbic Acid (VITAMIN C PO) Take 1 tablet by mouth at bedtime.    [provider]  aspirin EC 81 MG tablet Take 81 mg by mouth daily.    [provider]  Cholecalciferol (VITAMIN D3) 50 MCG (2000 UT) TABS Take 2,000 Units by mouth at bedtime.    [provider]  clopidogrel (PLAVIX) 75 MG tablet Take 1 tablet (75 mg total) by mouth daily. 12/26/20   Love, Evlyn Kanner, PA-C  clotrimazole-betamethasone (LOTRISONE) cream Apply topically 2 (two) times daily.  06/16/23 06/15/24  [provider]  Continuous Blood Gluc Sensor (DEXCOM G7 SENSOR) MISC 1 Device by Does not apply route as directed. 09/20/22   [name], Konrad Dolores, MD  Cyanocobalamin (B-12) 2500 MCG TABS Take 2,500 mcg by mouth daily.    [provider]  empagliflozin (JARDIANCE) 25 MG TABS tablet TAKE 1 TABLET(25 MG) BY MOUTH DAILY 06/01/23   [name], Konrad Dolores, MD  furosemide (LASIX) 20 MG tablet Take 1 tablet (20 mg total) by mouth daily for 10 days. 06/22/23 07/02/23  Glyn Ade, MD  gabapentin (NEURONTIN) 300 MG capsule Take 600 mg by mouth at bedtime.    [provider]  gabapentin (NEURONTIN) 400 MG capsule Take 1 capsule (400 mg total) by mouth 3 (three) times daily. Patient taking differently: Take 400 mg by mouth daily. 12/26/20   Love, Evlyn Kanner, PA-C  insulin aspart (NOVOLOG FLEXPEN) 100 UNIT/ML FlexPen Max daily 30 units Patient taking differently: Inject 2-5 Units into the skin daily. Per sliding scale 06/23/22   [name], Konrad Dolores, MD  insulin degludec (TRESIBA FLEXTOUCH) 100 UNIT/ML FlexTouch Pen Inject 12 Units into the skin daily. Patient taking differently: Inject 10 Units into the skin daily. 11/11/22   [name], Konrad Dolores, MD  Insulin Pen Needle 32G X 4 MM MISC 1 Device by Does not apply route in the morning, at noon, in the evening, and at bedtime. 06/23/22   [name], Konrad Dolores, MD  isosorbide mononitrate (IMDUR)  60 MG 24 hr tablet Take 1 tablet (60 mg total) by mouth daily. 04/28/23   Azucena Fallen, MD  losartan (COZAAR) 25 MG tablet Take 1 tablet (25 mg total) by mouth daily. 04/28/23   Azucena Fallen, MD  MAGNESIUM PO Take 1 tablet by mouth at bedtime.    [provider]  metFORMIN (GLUCOPHAGE) 1000 MG tablet Take 1 tablet (1,000 mg total) by mouth 2 (two) times daily. 02/12/22   [name], Konrad Dolores, MD  metoprolol tartrate (LOPRESSOR) 25 MG tablet Take 25 mg by mouth daily.     [provider]  nitroGLYCERIN (NITROSTAT) 0.4 MG SL tablet Place 0.4 mg under the tongue every 5 (five) minutes x 3 doses as needed for chest pain.     [provider]  Omega-3 Fatty Acids (FISH OIL) 1200 MG CAPS Take by mouth.    [provider]  pantoprazole (PROTONIX) 40 MG tablet Take by mouth. 06/16/23   [provider]  pioglitazone (ACTOS) 30 MG tablet TAKE 1 TABLET(30 MG) BY MOUTH DAILY 04/06/23   [name], Konrad Dolores, MD  polyethylene glycol (MIRALAX) 17 g packet Take 17 g by mouth 2 (two) times daily. Patient taking differently: Take 17 g by mouth daily as needed for mild constipation or moderate constipation. 02/03/21   Vassie Loll, MD  Potassium Chloride ER 20 MEQ TBCR Take 1 tablet by mouth daily. 06/02/23   [provider]  potassium chloride SA (KLOR-CON) 20 MEQ tablet Take 1 tablet (20 mEq total) by mouth daily. Patient taking differently: Take 20 mEq by mouth at bedtime. 02/03/21   Vassie Loll, MD  rosuvastatin (CRESTOR) 20 MG tablet Take 1 tablet (20 mg total) by mouth daily. 04/28/23   Azucena Fallen, MD  senna-docusate (SENOKOT-S) 8.6-50 MG tablet Take 1 tablet by mouth at bedtime. Patient taking differently: Take 1 tablet by mouth 2 (two) times daily. 02/03/21   Vassie Loll, MD  terazosin (HYTRIN) 5 MG capsule Take 1 capsule (5 mg total) by mouth at bedtime. Patient taking differently: Take 5 mg by mouth 2 (two) times daily. 12/26/20   Love, Evlyn Kanner, PA-C  traZODone (DESYREL) 50 MG tablet Take by mouth. 06/02/23   [provider]  Turmeric (QC TUMERIC COMPLEX PO) Take 1 capsule by mouth at bedtime.    [provider]      Allergies    Simvastatin, Zetia [ezetimibe], and Dilaudid [hydromorphone hcl]    Review of Systems   Review of Systems  Respiratory:  Positive for shortness of breath.     Physical Exam Updated Vital Signs BP 97/77   Pulse 63   Temp 98.2 F (36.8 C) (Oral)   Resp 16   Ht  5\' 10"  (1.778 m)   Wt 74.4 kg   SpO2 99%   BMI 23.53 kg/m  Physical Exam Constitutional:      Appearance: He is ill-appearing.  HENT:     Head: Normocephalic.  Eyes:     Pupils: Pupils are equal, round, and reactive to light.  Cardiovascular:     Rate and Rhythm: Normal rate.  Pulmonary:     Effort: Pulmonary effort is normal. No tachypnea.  Chest:     Chest wall: No mass.  Abdominal:     Palpations: Abdomen is soft. There is no hepatomegaly.  Genitourinary:    Comments: No blood on digital rectal exam Skin:    Coloration: Skin is pale.  Neurological:     General: No focal deficit  present.     Mental Status: He is alert.     ED Results / Procedures / Treatments   Labs (all labs ordered are listed, but only abnormal results are displayed) Labs Reviewed  COMPREHENSIVE METABOLIC PANEL - Abnormal; Notable for the following components:      Result Value   Glucose, Bld 154 (*)    BUN 24 (*)    Creatinine, Ser 1.36 (*)    Calcium 8.6 (*)    Total Protein 5.1 (*)    Albumin 2.3 (*)    AST 14 (*)    GFR, Estimated 52 (*)    All other components within normal limits  CBC WITH DIFFERENTIAL/PLATELET - Abnormal; Notable for the following components:   RBC 2.47 (*)    Hemoglobin 7.2 (*)    HCT 23.8 (*)    RDW 17.8 (*)    Platelets 102 (*)    All other components within normal limits  BRAIN NATRIURETIC PEPTIDE - Abnormal; Notable for the following components:   B Natriuretic Peptide 1,247.5 (*)    All other components within normal limits  TROPONIN I (HIGH SENSITIVITY) - Abnormal; Notable for the following components:   Troponin I (High Sensitivity) 19 (*)    All other components within normal limits  CULTURE, BLOOD (ROUTINE X 2)  CULTURE, BLOOD (ROUTINE X 2)  I-STAT CG4 LACTIC ACID, ED  TYPE AND SCREEN  TROPONIN I (HIGH SENSITIVITY)    EKG None  Radiology No results found.  Procedures Procedures    Medications Ordered in ED Medications  furosemide  (LASIX) injection 40 mg (40 mg Intravenous Given 07/03/23 1425)    ED Course/ Medical Decision Making/ A&P                                 Medical Decision Making 82 year old male here today after he continued to be weak, and fatigued since prior discharge.  Differential diagnoses include symptomatic anemia, slow GI bleed, CHF.  Plan-reportedly, patient was hypoxic for EMS, however he has not required any supplemental O2 here in the emergency department.  My dependent review the patient's chest x-ray does show pulmonary edema.  Will give the patient some IV Lasix.  Patient's hemoglobin today 7.2, down from mid nines 3 weeks ago.  I did a digital rectal exam which did not show any active bleeding.  He has no abdominal pain.  No indication for CT imaging of the abdomen pelvis at this time.  Patient not on thinners, does take Plavix.  Will admit patient to hospitalist for anemia, fluid overload.  Amount and/or Complexity of Data Reviewed Labs: ordered. Radiology: ordered.  Risk Prescription drug management.           Final Clinical Impression(s) / ED Diagnoses Final diagnoses:  Iron deficiency anemia, unspecified iron deficiency anemia type  Edema, unspecified type    Rx / DC Orders ED Discharge Orders     None         Anders Simmonds T, DO 07/03/23 1529

## 2023-07-03 NOTE — ED Triage Notes (Addendum)
Pt arrives via EMS from private residence. Pt with SOB, family states O2 68% on RA. 96% per first responders. Pt with generalized malaise and fluid buildup since femur fracture in August. Pt with L arm edema, pt reports it is due to a cat scratch.  Pt reports SOB when laying down at night the past few days.  Pt with pitting edema noted on L foot.  350 NS  4mg  zofran for nausea

## 2023-07-03 NOTE — ED Notes (Signed)
Pt assisted with urinal

## 2023-07-03 NOTE — Assessment & Plan Note (Deleted)
Generalized edema since hospital discharge 3 weeks ago in addition to orthopnea.  Echo 04/24/2023 with EF 60 to 65% and grade 1 diastolic dysfunction. Suspect SOB in setting of acute CHF exacerbation. BNP 1247.5. Trops trended flat.  - admitted to [facility], progressive, Dr. Deirdre Priest attending - S/p lasix 40mg  in the ED, will monitor output before additional doses - hold pioglitazone - repeat echo - strict I&Os - daily weights - orthostatics - am BMP, Mag - PT/OT

## 2023-07-04 ENCOUNTER — Telehealth: Payer: Self-pay

## 2023-07-04 ENCOUNTER — Inpatient Hospital Stay (HOSPITAL_COMMUNITY): Payer: Medicare Other

## 2023-07-04 DIAGNOSIS — R0609 Other forms of dyspnea: Secondary | ICD-10-CM

## 2023-07-04 DIAGNOSIS — R0602 Shortness of breath: Secondary | ICD-10-CM | POA: Diagnosis not present

## 2023-07-04 LAB — ECHOCARDIOGRAM COMPLETE
AR max vel: 1.24 cm2
AV Area VTI: 1.25 cm2
AV Area mean vel: 1.37 cm2
AV Mean grad: 13.3 mm[Hg]
AV Peak grad: 21.9 mm[Hg]
Ao pk vel: 2.34 m/s
Area-P 1/2: 3.08 cm2
Height: 70 in
S' Lateral: 4 cm
Weight: 2624.36 [oz_av]

## 2023-07-04 LAB — BLOOD CULTURE ID PANEL (REFLEXED) - BCID2

## 2023-07-04 LAB — CBC
HCT: 28.9 % — ABNORMAL LOW (ref 39.0–52.0)
HCT: 33.4 % — ABNORMAL LOW (ref 39.0–52.0)
Hemoglobin: 8.8 g/dL — ABNORMAL LOW (ref 13.0–17.0)
Hemoglobin: 10.1 g/dL — ABNORMAL LOW (ref 13.0–17.0)
MCH: 27.9 pg (ref 26.0–34.0)
MCH: 27.4 pg (ref 26.0–34.0)
MCHC: 30.4 g/dL (ref 30.0–36.0)
MCHC: 30.2 g/dL (ref 30.0–36.0)
MCV: 91.7 fL (ref 80.0–100.0)
MCV: 90.8 fL (ref 80.0–100.0)
Platelets: 96 10*3/uL — ABNORMAL LOW (ref 150–400)
Platelets: 114 10*3/uL — ABNORMAL LOW (ref 150–400)
RBC: 3.15 MIL/uL — ABNORMAL LOW (ref 4.22–5.81)
RBC: 3.68 MIL/uL — ABNORMAL LOW (ref 4.22–5.81)
RDW: 21.6 % — ABNORMAL HIGH (ref 11.5–15.5)
RDW: 21.5 % — ABNORMAL HIGH (ref 11.5–15.5)
WBC: 5.3 10*3/uL (ref 4.0–10.5)
WBC: 5.6 10*3/uL (ref 4.0–10.5)
nRBC: 0 % (ref 0.0–0.2)
nRBC: 0 % (ref 0.0–0.2)

## 2023-07-04 LAB — COMPREHENSIVE METABOLIC PANEL
ALT: 14 U/L (ref 0–44)
AST: 15 U/L (ref 15–41)
Albumin: 2.6 g/dL — ABNORMAL LOW (ref 3.5–5.0)
Alkaline Phosphatase: 75 U/L (ref 38–126)
Anion gap: 8 (ref 5–15)
BUN: 27 mg/dL — ABNORMAL HIGH (ref 8–23)
CO2: 29 mmol/L (ref 22–32)
Calcium: 8.8 mg/dL — ABNORMAL LOW (ref 8.9–10.3)
Chloride: 97 mmol/L — ABNORMAL LOW (ref 98–111)
Creatinine, Ser: 1.56 mg/dL — ABNORMAL HIGH (ref 0.61–1.24)
GFR, Estimated: 44 mL/min — ABNORMAL LOW (ref >60)
Glucose, Bld: 219 mg/dL — ABNORMAL HIGH (ref 70–99)
Potassium: 4 mmol/L (ref 3.5–5.1)
Sodium: 134 mmol/L — ABNORMAL LOW (ref 135–145)
Total Bilirubin: 0.8 mg/dL (ref 0.3–1.2)
Total Protein: 5.9 g/dL — ABNORMAL LOW (ref 6.5–8.1)

## 2023-07-04 LAB — FERRITIN: Ferritin: 44 ng/mL (ref 24–336)

## 2023-07-04 LAB — CBG MONITORING, ED
Glucose-Capillary: 170 mg/dL — ABNORMAL HIGH (ref 70–99)
Glucose-Capillary: 206 mg/dL — ABNORMAL HIGH (ref 70–99)
Glucose-Capillary: 272 mg/dL — ABNORMAL HIGH (ref 70–99)
Glucose-Capillary: 246 mg/dL — ABNORMAL HIGH (ref 70–99)

## 2023-07-04 LAB — IRON AND TIBC
Iron: 31 ug/dL — ABNORMAL LOW (ref 45–182)
Saturation Ratios: 11 % — ABNORMAL LOW (ref 17.9–39.5)
TIBC: 293 ug/dL (ref 250–450)
UIBC: 262 ug/dL

## 2023-07-04 LAB — BPAM RBC
Blood Product Expiration Date: 202410282359
ISSUE DATE / TIME: 202409291823
Unit Type and Rh: 5100

## 2023-07-04 LAB — MAGNESIUM: Magnesium: 1.9 mg/dL (ref 1.7–2.4)

## 2023-07-04 LAB — TYPE AND SCREEN
ABO/RH(D): O POS
Antibody Screen: NEGATIVE
Unit division: 0

## 2023-07-04 LAB — GLUCOSE, CAPILLARY: Glucose-Capillary: 317 mg/dL — ABNORMAL HIGH (ref 70–99)

## 2023-07-04 MED ORDER — PNEUMOCOCCAL 20-VAL CONJ VACC 0.5 ML IM SUSY
0.5000 mL | PREFILLED_SYRINGE | INTRAMUSCULAR | Status: DC
  Filled 2023-07-04: qty 0.5

## 2023-07-04 MED ORDER — INSULIN ASPART 100 UNIT/ML IJ SOLN
5.0000 [IU] | Freq: Once | INTRAMUSCULAR | Status: AC
  Administered 2023-07-04: 5 [IU] via SUBCUTANEOUS

## 2023-07-04 MED ORDER — PANTOPRAZOLE SODIUM 40 MG PO TBEC
40.0000 mg | DELAYED_RELEASE_TABLET | Freq: Every day | ORAL | Status: DC
  Administered 2023-07-04 – 2023-07-06 (×3): 40 mg via ORAL
  Filled 2023-07-04 (×3): qty 1

## 2023-07-04 MED ORDER — FUROSEMIDE 10 MG/ML IJ SOLN
40.0000 mg | Freq: Once | INTRAMUSCULAR | Status: AC
  Administered 2023-07-04: 40 mg via INTRAVENOUS

## 2023-07-04 MED ORDER — FUROSEMIDE 10 MG/ML IJ SOLN
80.0000 mg | Freq: Once | INTRAMUSCULAR | Status: DC
  Filled 2023-07-04: qty 8

## 2023-07-04 MED ORDER — INFLUENZA VAC A&B SURF ANT ADJ 0.5 ML IM SUSY
0.5000 mL | PREFILLED_SYRINGE | INTRAMUSCULAR | Status: AC
  Administered 2023-07-10: 0.5 mL via INTRAMUSCULAR
  Filled 2023-07-04 (×2): qty 0.5

## 2023-07-04 MED ORDER — ASPIRIN 81 MG PO TBEC
81.0000 mg | DELAYED_RELEASE_TABLET | Freq: Every day | ORAL | Status: DC
  Administered 2023-07-04 – 2023-07-07 (×4): 81 mg via ORAL
  Filled 2023-07-04 (×4): qty 1

## 2023-07-04 NOTE — Progress Notes (Signed)
PT Cancellation Note  Patient Details Name: Samin Milke MRN: [id number] DOB: [birth year]   Cancelled Treatment:    Reason Eval/Treat Not Completed: Patient at procedure or test/unavailable working with another provider, will return if time/schedule allow   Nedra Hai, PT, DPT 07/04/23 11:56 AM

## 2023-07-04 NOTE — ED Notes (Signed)
ED TO INPATIENT HANDOFF REPORT  ED Nurse Name and Phone #: Marcello Moores [phone]  S Name/Age/Gender Keith Espinoza 82 y.o. male Room/Bed: 007C/007C  Code Status   Code Status: Limited: Do not attempt resuscitation (DNR) -DNR-LIMITED -Do Not Intubate/DNI   Home/SNF/Other Home Patient oriented to: self, place, time, and situation Is this baseline? Yes   Triage Complete: Triage complete  Chief Complaint Shortness of breath [R06.02]  Triage Note Pt arrives via EMS from private residence. Pt with SOB, family states O2 68% on RA. 96% per first responders. Pt with generalized malaise and fluid buildup since femur fracture in August. Pt with L arm edema, pt reports it is due to a cat scratch.  Pt reports SOB when laying down at night the past few days.  Pt with pitting edema noted on L foot.  350 NS  4mg  zofran for nausea   Allergies Allergies  Allergen Reactions   Simvastatin Other (See Comments)    SEVERE MYALGIAS    Zetia [Ezetimibe] Other (See Comments)    MYALGIAS   Dilaudid [Hydromorphone Hcl] Other (See Comments)    hallucination    Level of Care/Admitting Diagnosis ED Disposition     ED Disposition  Admit   Condition  --   Comment  Hospital Area: [facility] [[id number]]  Level of Care: Progressive [102]  Admit to Progressive based on following criteria: GI, ENDOCRINE disease patients with GI bleeding, acute liver failure or pancreatitis, stable with diabetic ketoacidosis or thyrotoxicosis (hypothyroid) state.  May admit patient to [facility] or [facility] if equivalent level of care is available:: Yes  Covid Evaluation: Asymptomatic - no recent exposure (last 10 days) testing not required  Diagnosis: Shortness of breath [786.05.ICD-9-CM]  Admitting Physician: Alfredo Martinez [[id number]]  Attending Physician: Carney Living [phone]  Certification:: I certify this patient will need inpatient services for at least 2 midnights  Expected  Medical Readiness: 07/06/2023          B Medical/Surgery History Past Medical History:  Diagnosis Date   Allergic rhinitis    Allergic rhinitis    Arthritis    Basal cell carcinoma 11/01/2019    bcc left chest treatment [place] cx3 63fu    Chronic leg pain    right   Chronic lower back pain    Coronary artery disease    a. Stenting to RCA 2004; staged DES to LAD and Cx 2004. DES to [facility] 2012. b. DES to mCx, PTCA to dCx 11/2011. c. Lateral wall MI 2013 s/p PTCA to distal Cx & DES to mid OM2 11/2011. d. Low risk nuc 04/2014, EF wnl.   COVID-19    Diabetes mellitus    Insulin dependent   Diabetic neuropathy (HCC)    MILD   Diverticulosis    Dysrhythmia    [name] syndrome    Gout    right wrist; right foot; right elbow; have had it since 1970's   H/O hiatal hernia    Heart murmur    History of echocardiogram    aortic sclerosis per echo 12/09 EF 65%, otherwise normal   History of hemorrhoids    BLEEDING   History of kidney stones    h/o   Hypertension    Diagnosed 1995    Myocardial infarction [facility])    Pancreatic pseudocyst    a. s/p remote drainage 2006.   Thrombocytopenia (HCC)    Seen on oldest labs in system from 2004   Vitamin B 12 deficiency  orally replaced   Past Surgical History:  Procedure Laterality Date   ABDOMINAL AORTOGRAM W/LOWER EXTREMITY Bilateral 08/08/2020   Procedure: ABDOMINAL AORTOGRAM W/LOWER EXTREMITY;  Surgeon: Chuck Hint, MD;  Location: [facility] INVASIVE CV LAB;  Service: Cardiovascular;  Laterality: Bilateral;   AMPUTATION Right 12/12/2020   Procedure: RIGHT BELOW KNEE AMPUTATION;  Surgeon: Nadara Mustard, MD;  Location: [facility] OR;  Service: Orthopedics;  Laterality: Right;   BACK SURGERY     "total of 3 times" S/P fall    CARPAL TUNNEL RELEASE Bilateral    CHOLECYSTECTOMY  1990's   COLONOSCOPY     CORONARY ANGIOPLASTY  11/11/11   CORONARY ANGIOPLASTY WITH STENT PLACEMENT  09/30/2011   "1 then; makes a total of 4"   CORONARY ANGIOPLASTY  WITH STENT PLACEMENT  11/11/11   "1; makes a total of 5"   INGUINAL HERNIA REPAIR  2003   right   JOINT REPLACEMENT Right 04/03/2002   hip replacment   KNEE ARTHROSCOPY  1990's   left   LEFT HEART CATH AND CORONARY ANGIOGRAPHY N/A 04/25/2023   Procedure: LEFT HEART CATH AND CORONARY ANGIOGRAPHY;  Surgeon: Marykay Lex, MD;  Location: [facility] INVASIVE CV LAB;  Service: Cardiovascular;  Laterality: N/A;   LEFT HEART CATHETERIZATION WITH CORONARY ANGIOGRAM N/A 09/30/2011   Procedure: LEFT HEART CATHETERIZATION WITH CORONARY ANGIOGRAM;  Surgeon: Corky Crafts, MD;  Location: [facility] CATH LAB;  Service: Cardiovascular;  Laterality: N/A;  possible PCI   LEFT HEART CATHETERIZATION WITH CORONARY ANGIOGRAM N/A 11/15/2011   Procedure: LEFT HEART CATHETERIZATION WITH CORONARY ANGIOGRAM;  Surgeon: Corky Crafts, MD;  Location: [facility] CATH LAB;  Service: Cardiovascular;  Laterality: N/A;   PERCUTANEOUS CORONARY STENT INTERVENTION (PCI-S)  09/30/2011   Procedure: PERCUTANEOUS CORONARY STENT INTERVENTION (PCI-S);  Surgeon: Corky Crafts, MD;  Location: [facility] CATH LAB;  Service: Cardiovascular;;   PERCUTANEOUS CORONARY STENT INTERVENTION (PCI-S) N/A 11/11/2011   Procedure: PERCUTANEOUS CORONARY STENT INTERVENTION (PCI-S);  Surgeon: Corky Crafts, MD;  Location: [facility] CATH LAB;  Service: Cardiovascular;  Laterality: N/A;   PERIPHERAL VASCULAR BALLOON ANGIOPLASTY Right 08/08/2020   Procedure: PERIPHERAL VASCULAR BALLOON ANGIOPLASTY;  Surgeon: Chuck Hint, MD;  Location: [facility] INVASIVE CV LAB;  Service: Cardiovascular;  Laterality: Right;  Posterior tibial    SHOULDER SURGERY Right    X 2   STUMP REVISION Right 01/09/2021   Procedure: REVISION RIGHT BELOW KNEE AMPUTATION;  Surgeon: Nadara Mustard, MD;  Location: [facility] OR;  Service: Orthopedics;  Laterality: Right;   TONSILLECTOMY  ~ 1948   TOTAL HIP REVISION Right 04/13/2019   Procedure: RIGHT TOTAL HIP REVISION-POSTERIOR  APPROACH LATERAL;  Surgeon: Eldred Manges, MD;  Location: [facility] OR;  Service: Orthopedics;  Laterality: Right;   TOTAL KNEE ARTHROPLASTY Left 07/23/2016   Procedure: LEFT TOTAL KNEE ARTHROPLASTY;  Surgeon: Eldred Manges, MD;  Location: [facility] OR;  Service: Orthopedics;  Laterality: Left;     A IV Location/Drains/Wounds Patient Lines/Drains/Airways Status     Active Line/Drains/Airways     Name Placement date Placement time Site Days   Peripheral IV 07/03/23 20 G 1" Right Antecubital 07/03/23  1304  Antecubital  1            Intake/Output Last 24 hours  Intake/Output Summary (Last 24 hours) at 07/04/2023 1121 Last data filed at 07/04/2023 0928 Gross per 24 hour  Intake 315 ml  Output 300 ml  Net 15 ml    Labs/Imaging Results for orders placed or performed during  the hospital encounter of 07/03/23 (from the past 48 hour(s))  Comprehensive metabolic panel     Status: Abnormal   Collection Time: 07/03/23  1:05 PM  Result Value Ref Range   Sodium 136 135 - 145 mmol/L   Potassium 3.7 3.5 - 5.1 mmol/L   Chloride 99 98 - 111 mmol/L   CO2 26 22 - 32 mmol/L   Glucose, Bld 154 (H) 70 - 99 mg/dL    Comment: Glucose reference range applies only to samples taken after fasting for at least 8 hours.   BUN 24 (H) 8 - 23 mg/dL   Creatinine, Ser 1.61 (H) 0.61 - 1.24 mg/dL   Calcium 8.6 (L) 8.9 - 10.3 mg/dL   Total Protein 5.1 (L) 6.5 - 8.1 g/dL   Albumin 2.3 (L) 3.5 - 5.0 g/dL   AST 14 (L) 15 - 41 U/L   ALT 12 0 - 44 U/L   Alkaline Phosphatase 72 38 - 126 U/L   Total Bilirubin 0.6 0.3 - 1.2 mg/dL   GFR, Estimated 52 (L) >60 mL/min    Comment: (NOTE) Calculated using the CKD-EPI Creatinine Equation (2021)    Anion gap 11 5 - 15    Comment: Performed at [facility], [address], [place], [place] [address]  CBC with Differential     Status: Abnormal   Collection Time: 07/03/23  1:05 PM  Result Value Ref Range   WBC 5.6 4.0 - 10.5 K/uL   RBC 2.47 (L) 4.22 - 5.81 MIL/uL   Hemoglobin 7.2 (L) 13.0 - 17.0 g/dL   HCT  54.0 (L) 98.1 - 52.0 %   MCV 96.4 80.0 - 100.0 fL   MCH 29.1 26.0 - 34.0 pg   MCHC 30.3 30.0 - 36.0 g/dL   RDW 19.1 (H) 47.8 - 29.5 %   Platelets 102 (L) 150 - 400 K/uL    Comment: SPECIMEN CHECKED FOR CLOTS REPEATED TO VERIFY PLATELET COUNT CONFIRMED BY SMEAR    nRBC 0.0 0.0 - 0.2 %   Neutrophils Relative % 67 %   Neutro Abs 3.8 1.7 - 7.7 K/uL   Lymphocytes Relative 21 %   Lymphs Abs 1.2 0.7 - 4.0 K/uL   Monocytes Relative 7 %   Monocytes Absolute 0.4 0.1 - 1.0 K/uL   Eosinophils Relative 4 %   Eosinophils Absolute 0.2 0.0 - 0.5 K/uL   Basophils Relative 0 %   Basophils Absolute 0.0 0.0 - 0.1 K/uL   WBC Morphology MORPHOLOGY UNREMARKABLE    RBC Morphology MORPHOLOGY UNREMARKABLE    Smear Review Normal platelet morphology    Immature Granulocytes 1 %   Abs Immature Granulocytes 0.03 0.00 - 0.07 K/uL    Comment: Performed at [facility], [address], [place], [place] [address]  Brain natriuretic peptide     Status: Abnormal   Collection Time: 07/03/23  1:05 PM  Result Value Ref Range   B Natriuretic Peptide 1,247.5 (H) 0.0 - 100.0 pg/mL    Comment: Performed at [facility], [address] [place], [place] [address]  Troponin I (High Sensitivity)     Status: Abnormal   Collection Time: 07/03/23  1:05 PM  Result Value Ref Range   Troponin I (High Sensitivity) 19 (H) <18 ng/L    Comment: (NOTE) Elevated high sensitivity troponin I (hsTnI) values and significant  changes across serial measurements may suggest ACS but many other  chronic and acute conditions are known to elevate hsTnI results.  Refer to the "Links" section for chest pain algorithms and additional  guidance. Performed at [facility], [address] [place], [place] [address]   I-Stat CG4 Lactic Acid     Status: None   Collection Time: 07/03/23  1:17 PM  Result Value Ref Range   Lactic Acid, Venous 1.1 0.5 - 1.9 mmol/L  Blood culture (routine x 2)     Status: None (Preliminary  result)   Collection Time: 07/03/23  1:23 PM   Specimen: BLOOD  Result Value Ref Range   Specimen Description BLOOD RIGHT ANTECUBITAL    Special Requests      BOTTLES DRAWN AEROBIC AND ANAEROBIC Blood Culture adequate volume   Culture  Setup Time      GRAM POSITIVE COCCI IN CLUSTERS ANAEROBIC BOTTLE ONLY Organism ID to follow Performed at [facility], [address], [place], [place] [address]    Culture GRAM POSITIVE COCCI    Report Status PENDING   Blood culture (routine x 2)     Status: None (Preliminary result)   Collection Time: 07/03/23  1:50 PM   Specimen: BLOOD RIGHT WRIST  Result Value Ref Range   Specimen Description BLOOD RIGHT WRIST    Special Requests      BOTTLES DRAWN AEROBIC AND ANAEROBIC Blood Culture adequate volume   Culture      NO GROWTH < 24 HOURS Performed at [facility], [address], [place], [place] [address]    Report Status PENDING   Troponin I (High Sensitivity)     Status: Abnormal   Collection Time: 07/03/23  3:17 PM  Result Value Ref Range   Troponin I (High Sensitivity) 22 (H) <18 ng/L    Comment: (NOTE) Elevated high sensitivity troponin I (hsTnI) values and significant  changes across serial measurements may suggest ACS but many other  chronic and acute conditions are known to elevate hsTnI results.  Refer to the "Links" section for chest pain algorithms and additional  guidance. Performed at [facility], [address] [place], [place] [address]   Type and screen [facility]     Status: None (Preliminary result)   Collection Time: 07/03/23  3:17 PM  Result Value Ref Range   ABO/RH(D) O POS    Antibody Screen NEG    Sample Expiration 07/06/2023,2359    Unit Number [id number]    Blood Component Type RED CELLS,LR    Unit division 00    Status of Unit ISSUED    Transfusion Status OK TO TRANSFUSE    Crossmatch Result      Compatible Performed at [facility], [address]  [place], [place] [address]   Prepare RBC (crossmatch)     Status: None   Collection Time: 07/03/23  4:32 PM  Result Value Ref Range   Order Confirmation      ORDER PROCESSED BY BLOOD BANK Performed at [facility], [address] [place], [place] [address]   CBG monitoring, ED     Status: Abnormal   Collection Time: 07/03/23  5:37 PM  Result Value Ref Range   Glucose-Capillary 170 (H) 70 - 99 mg/dL    Comment: Glucose reference range applies only to samples taken after fasting for at least 8 hours.   Comment 1 Notify RN    Comment 2 Document in Chart   CBG monitoring, ED     Status: Abnormal   Collection Time: 07/03/23 10:24 PM  Result Value Ref Range   Glucose-Capillary 158 (H) 70 - 99 mg/dL    Comment: Glucose reference range applies only to samples taken after fasting for at least 8 hours.  Ferritin     Status: None   Collection Time: 07/04/23 12:17 AM  Result Value Ref Range   Ferritin 44 24 - 336 ng/mL    Comment: Performed at [facility] Lab, [address], [place], [place] [address]  Iron and TIBC     Status: Abnormal   Collection Time: 07/04/23 12:17 AM  Result Value Ref Range   Iron 31 (L) 45 - 182 ug/dL   TIBC 604 540 - 981 ug/dL   Saturation Ratios 11 (L) 17.9 - 39.5 %   UIBC 262 ug/dL    Comment: Performed at [facility], [address], [place], [place] [address]  CBC     Status: Abnormal   Collection Time: 07/04/23 12:17 AM  Result Value Ref Range   WBC 5.3 4.0 - 10.5 K/uL   RBC 3.15 (L) 4.22 - 5.81 MIL/uL   Hemoglobin 8.8 (L) 13.0 - 17.0 g/dL   HCT 82.9 (L) 56.2 - 13.0 %   MCV 91.7 80.0 - 100.0 fL   MCH 27.9 26.0 - 34.0 pg   MCHC 30.4 30.0 - 36.0 g/dL   RDW 86.5 (H) 78.4 - 69.6 %   Platelets 96 (L) 150 - 400 K/uL    Comment: Immature Platelet Fraction may be clinically indicated, consider ordering this additional test [id number] REPEATED TO VERIFY    nRBC 0.0 0.0 - 0.2 %    Comment: Performed at [facility] Lab, [address],  [place], [place] [address]  CBC     Status: Abnormal   Collection Time: 07/04/23  6:09 AM  Result Value Ref Range   WBC 5.6 4.0 - 10.5 K/uL   RBC 3.68 (L) 4.22 - 5.81 MIL/uL   Hemoglobin 10.1 (L) 13.0 - 17.0 g/dL   HCT 02.7 (L) 25.3 - 66.4 %   MCV 90.8 80.0 - 100.0 fL   MCH 27.4 26.0 - 34.0 pg   MCHC 30.2 30.0 - 36.0 g/dL   RDW 40.3 (H) 47.4 - 25.9 %   Platelets 114 (L) 150 - 400 K/uL    Comment: REPEATED TO VERIFY   nRBC 0.0 0.0 - 0.2 %    Comment: Performed at [facility], [address] [place], [place] [address]  Magnesium     Status: None   Collection Time: 07/04/23  6:09 AM  Result Value Ref Range   Magnesium 1.9 1.7 - 2.4 mg/dL    Comment: Performed at [facility], [address] [place], [place] [address]  Comprehensive metabolic panel     Status: Abnormal   Collection Time: 07/04/23  6:09 AM  Result Value Ref Range   Sodium 134 (L) 135 - 145 mmol/L   Potassium 4.0 3.5 - 5.1 mmol/L   Chloride 97 (L) 98 - 111 mmol/L   CO2 29 22 - 32 mmol/L   Glucose, Bld 219 (H) 70 - 99 mg/dL    Comment: Glucose reference range applies only to samples taken after fasting for at least 8 hours.   BUN 27 (H) 8 - 23 mg/dL   Creatinine, Ser 2.95 (H) 0.61 - 1.24 mg/dL   Calcium 8.8 (L) 8.9 - 10.3 mg/dL   Total Protein 5.9 (L) 6.5 - 8.1 g/dL   Albumin 2.6 (L) 3.5 - 5.0  g/dL   AST 15 15 - 41 U/L   ALT 14 0 - 44 U/L   Alkaline Phosphatase 75 38 - 126 U/L   Total Bilirubin 0.8 0.3 - 1.2 mg/dL   GFR, Estimated 44 (L) >60 mL/min    Comment: (NOTE) Calculated using the CKD-EPI Creatinine Equation (2021)    Anion gap 8 5 - 15    Comment: Performed at [facility], [address] [place], [place] [address]  CBG monitoring, ED     Status: Abnormal   Collection Time: 07/04/23  7:38 AM  Result Value Ref Range   Glucose-Capillary 206 (H) 70 - 99 mg/dL    Comment: Glucose reference range applies only to samples taken after fasting for at least 8 hours.   DG Chest Port 1  View  Result Date: 07/03/2023 CLINICAL DATA:  Evaluate pneumonia. EXAM: PORTABLE CHEST 1 VIEW COMPARISON:  06/22/2023 FINDINGS: Stable cardiomediastinal contours. Low lung volumes with asymmetric elevation of the right hemidiaphragm. Opacity in the right lung base is noted which is favored to represent a combination of small pleural effusion with overlying atelectasis or airspace disease. The visualized osseous structures appear intact. IMPRESSION: 1. Low lung volumes with asymmetric elevation of the right hemidiaphragm. 2. Right lung base opacity is favored to represent a combination of small pleural effusion with overlying atelectasis or airspace disease. Electronically Signed   By: Signa Kell M.D.   On: 07/03/2023 13:47    Pending Labs Unresulted Labs (From admission, onward)     Start     Ordered   07/05/23 0500  CBC  Tomorrow morning,   R        07/04/23 1042   07/05/23 0500  Basic metabolic panel  Tomorrow morning,   R        07/04/23 1042   07/03/23 1323  Blood Culture ID Panel (Reflexed)  Once,   STAT        07/03/23 1323            Vitals/Pain Today's Vitals   07/04/23 0730 07/04/23 0745 07/04/23 0900 07/04/23 1100  BP: 126/62  (!) 112/54 (!) 117/58  Pulse: (!) 58  (!) 59 76  Resp: 18  17 16   Temp:  97.6 F (36.4 C)    TempSrc:  Oral    SpO2: 97%  95% 94%  Weight:      Height:      PainSc:        Isolation Precautions No active isolations  Medications Medications  0.9 %  sodium chloride infusion (Manually program via Guardrails IV Fluids) (0 mLs Intravenous Hold 07/03/23 1732)  gabapentin (NEURONTIN) capsule 600 mg (600 mg Oral Given 07/03/23 2344)  gabapentin (NEURONTIN) capsule 400 mg (400 mg Oral Given 07/04/23 1040)  rosuvastatin (CRESTOR) tablet 20 mg (20 mg Oral Given 07/04/23 1054)  tamsulosin (FLOMAX) capsule 0.4 mg (0.4 mg Oral Given 07/03/23 1745)  insulin glargine-yfgn (SEMGLEE) injection 5 Units (5 Units Subcutaneous Given 07/04/23 1040)  insulin  aspart (novoLOG) injection 0-9 Units (3 Units Subcutaneous Given 07/04/23 0743)  furosemide (LASIX) injection 80 mg (has no administration in time range)  pantoprazole (PROTONIX) EC tablet 40 mg (40 mg Oral Given 07/04/23 1121)  aspirin EC tablet 81 mg (81 mg Oral Given 07/04/23 1121)  furosemide (LASIX) injection 40 mg (40 mg Intravenous Given 07/03/23 1425)  furosemide (LASIX) injection 40 mg (40 mg Intravenous Given 07/04/23 1041)    Mobility manual wheelchair     Focused Assessments  R Recommendations: See Admitting Provider Note  Report given to:   Additional Notes:

## 2023-07-04 NOTE — ED Notes (Signed)
Pt left leg elevated on pillows

## 2023-07-04 NOTE — Progress Notes (Signed)
PHARMACY - PHYSICIAN COMMUNICATION CRITICAL VALUE ALERT - BLOOD CULTURE IDENTIFICATION (BCID)  Keith Espinoza is an 82 y.o. male who presented to [facility] on 07/03/2023 with a chief complaint of shortness of breath, presumed HF exacerbation.  BCx obtained in the ED.  Assessment:  1/4 BCx bottles with MRSE, likely contaminent  Name of physician (or Provider) Contacted: Dr. Alfredo Martinez  Current antibiotics: none  Changes to prescribed antibiotics recommended:  No antibiotics recommended at this time.   Results for orders placed or performed during the hospital encounter of 07/03/23  Blood Culture ID Panel (Reflexed) (Collected: 07/03/2023  1:23 PM)  Result Value Ref Range   Enterococcus faecalis NOT DETECTED NOT DETECTED   Enterococcus Faecium NOT DETECTED NOT DETECTED   Listeria monocytogenes NOT DETECTED NOT DETECTED   Staphylococcus species DETECTED (A) NOT DETECTED   Staphylococcus aureus (BCID) NOT DETECTED NOT DETECTED   Staphylococcus epidermidis DETECTED (A) NOT DETECTED   Staphylococcus lugdunensis NOT DETECTED NOT DETECTED   Streptococcus species NOT DETECTED NOT DETECTED   Streptococcus agalactiae NOT DETECTED NOT DETECTED   Streptococcus pneumoniae NOT DETECTED NOT DETECTED   Streptococcus pyogenes NOT DETECTED NOT DETECTED   A.calcoaceticus-baumannii NOT DETECTED NOT DETECTED   Bacteroides fragilis NOT DETECTED NOT DETECTED   Enterobacterales NOT DETECTED NOT DETECTED   Enterobacter cloacae complex NOT DETECTED NOT DETECTED   Escherichia coli NOT DETECTED NOT DETECTED   Klebsiella aerogenes NOT DETECTED NOT DETECTED   Klebsiella oxytoca NOT DETECTED NOT DETECTED   Klebsiella pneumoniae NOT DETECTED NOT DETECTED   Proteus species NOT DETECTED NOT DETECTED   Salmonella species NOT DETECTED NOT DETECTED   Serratia marcescens NOT DETECTED NOT DETECTED   Haemophilus influenzae NOT DETECTED NOT DETECTED   Neisseria meningitidis NOT DETECTED NOT DETECTED    Pseudomonas aeruginosa NOT DETECTED NOT DETECTED   Stenotrophomonas maltophilia NOT DETECTED NOT DETECTED   Candida albicans NOT DETECTED NOT DETECTED   Candida auris NOT DETECTED NOT DETECTED   Candida glabrata NOT DETECTED NOT DETECTED   Candida krusei NOT DETECTED NOT DETECTED   Candida parapsilosis NOT DETECTED NOT DETECTED   Candida tropicalis NOT DETECTED NOT DETECTED   Cryptococcus neoformans/gattii NOT DETECTED NOT DETECTED   Methicillin resistance mecA/C DETECTED (A) NOT DETECTED    Keith Espinoza, Judie Bonus 07/04/2023  12:32 PM

## 2023-07-04 NOTE — Assessment & Plan Note (Addendum)
Patient's SOB likely secondary to acute CHF exacerbation. On exam, patient continues to have persistent 3+ pitting edema of L leg and bibasilar crackles on lung exam. Cxr with possible R pleural effusion. Patient received 40 mg Lasix yesterday evening and reports about half a liter of urine output, recorded at 300 mL. Given limited response to initial diuresis, will attempt again today.  - 80 mg Lasix today - Cont 0.4 mg Flomax - Bladder scan q4 to monitor for urinary retention  - Strict I/Os - Daily weights - F/u echocardiogram  - AM BMP, CBC

## 2023-07-04 NOTE — Evaluation (Signed)
Physical Therapy Evaluation Patient Details Name: Keith Espinoza MRN: [id number] DOB: April 17, 1941 Today's Date: 07/04/2023  History of Present Illness  81yo who presented on 07/03/23 with SOB, CXR concerning for R pleural effusion. Of note did have a L femur fracture and subsequent ORIF in August 2024. Admitted with suspected SOB in setting of acute CHF exacerbation. PMH CAD, MI, IDDM, gilbert syndrome, HTN, HLD, gout, R BKA, cholecystectomy, back surgery  Clinical Impression   Pt received in bed. Asked about pain levels and pt immediately became agitated, described how frustrated he is with his current situation of having prosthetic on one side and now broken leg/being NWB on the other "its like being in jail". Provided empathetic listening as appropriate and encouraged mobility as able. He was initially agreeable to brief PT eval but remained agitated with therapist, when working on assisted rolling in bed became physically aggressive with PT stating "get out of my way" and pushing therapist backwards. Ended eval early for staff safety due to inappropriate behavior. Left in bed with CNA attending. Definitely in need of skilled PT f/u after DC from the hospital.       If plan is discharge home, recommend the following: Two people to help with walking and/or transfers;A lot of help with bathing/dressing/bathroom;Assistance with cooking/housework;Direct supervision/assist for medications management;Assist for transportation;A lot of help with walking and/or transfers;Two people to help with bathing/dressing/bathroom;Direct supervision/assist for financial management;Help with stairs or ramp for entrance   Can travel by private vehicle   No    Equipment Recommendations Other (comment) (defer to next venue)  Recommendations for Other Services       Functional Status Assessment Patient has had a recent decline in their functional status and demonstrates the ability to make significant  improvements in function in a reasonable and predictable amount of time.     Precautions / Restrictions Precautions Precautions: Fall;Other (comment) Precaution Comments: chronic R BKA, recent L LE femur fracture s/p ORIF now NWB Restrictions Weight Bearing Restrictions: Yes LLE Weight Bearing: Non weight bearing      Mobility  Bed Mobility Overal bed mobility: Needs Assistance Bed Mobility: Rolling Rolling: Mod assist, Used rails         General bed mobility comments: agreeable to quick PT evaluation- needed ModA from PT, then asked "what do you want me to do again?" when PT re-explained assessing bed mobility, pt physically shoved PT backwards before reaching for bed rail    Transfers                   General transfer comment: deferred, agitation and aggressive behavior from patient    Ambulation/Gait               General Gait Details: deferred  Stairs            Wheelchair Mobility     Tilt Bed    Modified Rankin (Stroke Patients Only)       Balance                                             Pertinent Vitals/Pain Pain Assessment Pain Assessment: No/denies pain    Home Living Family/patient expects to be discharged to:: Private residence Living Arrangements: Alone Available Help at Discharge: Family;Available 24 hours/day Type of Home: House Home Access: Level entry       Home Layout: One  level (pt lives in basement) Home Equipment: Agricultural consultant (2 wheels);Cane - single point;BSC/3in1;Shower seat;Wheelchair - manual;Wheelchair - power;Grab bars - toilet;Grab bars - tub/shower Additional Comments: lives with 2 daughters- one works from 8-5:30pm, 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, 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, has home aide as well    Prior Function Prior Level of Function : Other (comment) (unclear PLOF following recent ORIF)             Mobility Comments: most of PLOF/DME taken from prior charting as pt was inappropriate  and agitated with PT       Extremity/Trunk Assessment   Upper Extremity Assessment Upper Extremity Assessment: Generalized weakness    Lower Extremity Assessment Lower Extremity Assessment: Generalized weakness    Cervical / Trunk Assessment Cervical / Trunk Assessment: Kyphotic  Communication   Communication Communication: No apparent difficulties Cueing Techniques: Verbal cues  Cognition Arousal: Alert Behavior During Therapy: Agitated                                   General Comments: verbally agitated with PT progressed to being physically inappropriate with therapist- physically pushed PT backwards when assiting with rolling        General Comments General comments (skin integrity, edema, etc.): did not get to EOB to assess balance    Exercises     Assessment/Plan    PT Assessment Patient needs continued PT services  PT Problem List Decreased strength;Decreased balance;Decreased mobility;Decreased knowledge of use of DME;Cardiopulmonary status limiting activity;Decreased activity tolerance;Decreased coordination;Decreased safety awareness       PT Treatment Interventions DME instruction;Therapeutic activities;Gait training;Therapeutic exercise;Patient/family education;Stair training;Balance training;Wheelchair mobility training;Functional mobility training;Neuromuscular re-education    PT Goals (Current goals can be found in the Care Plan section)  Acute Rehab PT Goals Patient Stated Goal: get out of this room and eat lunch PT Goal Formulation: With patient Time For Goal Achievement: 07/18/23 Potential to Achieve Goals: Fair    Frequency Min 1X/week     Co-evaluation               AM-PAC PT "6 Clicks" Mobility  Outcome Measure Help needed turning from your back to your side while in a flat bed without using bedrails?: A Lot Help needed moving from lying on your back to sitting on the side of a flat bed without using bedrails?:  Total Help needed moving to and from a bed to a chair (including a wheelchair)?: Total Help needed standing up from a chair using your arms (e.g., wheelchair or bedside chair)?: Total Help needed to walk in hospital room?: Total Help needed climbing 3-5 steps with a railing? : Total 6 Click Score: 7    End of Session   Activity Tolerance: Treatment limited secondary to agitation Patient left: in bed;with call bell/phone within reach;Other (comment) (CNA present and attending) Nurse Communication: Mobility status;Other (comment);Weight bearing status (agitation) PT Visit Diagnosis: Muscle weakness (generalized) (M62.81);Difficulty in walking, not elsewhere classified (R26.2);Other abnormalities of gait and mobility (R26.89)    Time: 6578-4696 PT Time Calculation (min) (ACUTE ONLY): 9 min   Charges:   PT Evaluation $PT Eval Moderate Complexity: 1 Mod   PT General Charges $$ ACUTE PT VISIT: 1 Visit       Nedra Hai, PT, DPT 07/04/23 1:08 PM

## 2023-07-04 NOTE — Telephone Encounter (Signed)
This pt was on the sch to be seen for blister on BKA Wednesday. Front desk called to cx appt and pt states that he is in the hospital ( admitted for SOB and weakness) wanted to know if you could see him while he is there.

## 2023-07-04 NOTE — ED Notes (Signed)
ED TO INPATIENT HANDOFF REPORT  ED Nurse Name and Phone #: Alycia Rossetti [phone]  S Name/Age/Gender Keith Espinoza 82 y.o. male Room/Bed: 007C/007C  Code Status   Code Status: Limited: Do not attempt resuscitation (DNR) -DNR-LIMITED -Do Not Intubate/DNI   Home/SNF/Other Home Patient oriented to: self, place, time, and situation Is this baseline? Yes   Triage Complete: Triage complete  Chief Complaint Shortness of breath [R06.02]  Triage Note Pt arrives via EMS from private residence. Pt with SOB, family states O2 68% on RA. 96% per first responders. Pt with generalized malaise and fluid buildup since femur fracture in August. Pt with L arm edema, pt reports it is due to a cat scratch.  Pt reports SOB when laying down at night the past few days.  Pt with pitting edema noted on L foot.  350 NS  4mg  zofran for nausea   Allergies Allergies  Allergen Reactions   Simvastatin Other (See Comments)    SEVERE MYALGIAS    Zetia [Ezetimibe] Other (See Comments)    MYALGIAS   Dilaudid [Hydromorphone Hcl] Other (See Comments)    hallucination    Level of Care/Admitting Diagnosis ED Disposition     ED Disposition  Admit   Condition  --   Comment  Hospital Area: [facility] [[id number]]  Level of Care: Progressive [102]  Admit to Progressive based on following criteria: GI, ENDOCRINE disease patients with GI bleeding, acute liver failure or pancreatitis, stable with diabetic ketoacidosis or thyrotoxicosis (hypothyroid) state.  May admit patient to [facility] or [facility] if equivalent level of care is available:: Yes  Covid Evaluation: Asymptomatic - no recent exposure (last 10 days) testing not required  Diagnosis: Shortness of breath [786.05.ICD-9-CM]  Admitting Physician: Alfredo Martinez [[id number]]  Attending Physician: Carney Living [phone]  Certification:: I certify this patient will need inpatient services for at least 2 midnights  Expected Medical  Readiness: 07/06/2023          B Medical/Surgery History Past Medical History:  Diagnosis Date   Allergic rhinitis    Allergic rhinitis    Arthritis    Basal cell carcinoma 11/01/2019    bcc left chest treatment [place] cx3 [id number]    Chronic leg pain    right   Chronic lower back pain    Coronary artery disease    a. Stenting to RCA 2004; staged DES to LAD and Cx 2004. DES to [facility] 2012. b. DES to mCx, PTCA to dCx 11/2011. c. Lateral wall MI 2013 s/p PTCA to distal Cx & DES to mid OM2 11/2011. d. Low risk nuc 04/2014, EF wnl.   COVID-19    Diabetes mellitus    Insulin dependent   Diabetic neuropathy (HCC)    MILD   Diverticulosis    Dysrhythmia    [name] syndrome    Gout    right wrist; right foot; right elbow; have had it since 1970's   H/O hiatal hernia    Heart murmur    History of echocardiogram    aortic sclerosis per echo 12/09 EF 65%, otherwise normal   History of hemorrhoids    BLEEDING   History of kidney stones    h/o   Hypertension    Diagnosed 1995    Myocardial infarction [facility])    Pancreatic pseudocyst    a. s/p remote drainage 2006.   Thrombocytopenia (HCC)    Seen on oldest labs in system from 2004   Vitamin B 12 deficiency  orally replaced   Past Surgical History:  Procedure Laterality Date   ABDOMINAL AORTOGRAM W/LOWER EXTREMITY Bilateral 08/08/2020   Procedure: ABDOMINAL AORTOGRAM W/LOWER EXTREMITY;  Surgeon: Chuck Hint, MD;  Location: [facility] INVASIVE CV LAB;  Service: Cardiovascular;  Laterality: Bilateral;   AMPUTATION Right 12/12/2020   Procedure: RIGHT BELOW KNEE AMPUTATION;  Surgeon: Nadara Mustard, MD;  Location: [facility] OR;  Service: Orthopedics;  Laterality: Right;   BACK SURGERY     "total of 3 times" S/P fall    CARPAL TUNNEL RELEASE Bilateral    CHOLECYSTECTOMY  1990's   COLONOSCOPY     CORONARY ANGIOPLASTY  11/11/11   CORONARY ANGIOPLASTY WITH STENT PLACEMENT  09/30/2011   "1 then; makes a total of 4"   CORONARY ANGIOPLASTY WITH STENT  PLACEMENT  11/11/11   "1; makes a total of 5"   INGUINAL HERNIA REPAIR  2003   right   JOINT REPLACEMENT Right 04/03/2002   hip replacment   KNEE ARTHROSCOPY  1990's   left   LEFT HEART CATH AND CORONARY ANGIOGRAPHY N/A 04/25/2023   Procedure: LEFT HEART CATH AND CORONARY ANGIOGRAPHY;  Surgeon: Marykay Lex, MD;  Location: [facility] INVASIVE CV LAB;  Service: Cardiovascular;  Laterality: N/A;   LEFT HEART CATHETERIZATION WITH CORONARY ANGIOGRAM N/A 09/30/2011   Procedure: LEFT HEART CATHETERIZATION WITH CORONARY ANGIOGRAM;  Surgeon: Corky Crafts, MD;  Location: [facility] CATH LAB;  Service: Cardiovascular;  Laterality: N/A;  possible PCI   LEFT HEART CATHETERIZATION WITH CORONARY ANGIOGRAM N/A 11/15/2011   Procedure: LEFT HEART CATHETERIZATION WITH CORONARY ANGIOGRAM;  Surgeon: Corky Crafts, MD;  Location: [facility] CATH LAB;  Service: Cardiovascular;  Laterality: N/A;   PERCUTANEOUS CORONARY STENT INTERVENTION (PCI-S)  09/30/2011   Procedure: PERCUTANEOUS CORONARY STENT INTERVENTION (PCI-S);  Surgeon: Corky Crafts, MD;  Location: [facility] CATH LAB;  Service: Cardiovascular;;   PERCUTANEOUS CORONARY STENT INTERVENTION (PCI-S) N/A 11/11/2011   Procedure: PERCUTANEOUS CORONARY STENT INTERVENTION (PCI-S);  Surgeon: Corky Crafts, MD;  Location: [facility] CATH LAB;  Service: Cardiovascular;  Laterality: N/A;   PERIPHERAL VASCULAR BALLOON ANGIOPLASTY Right 08/08/2020   Procedure: PERIPHERAL VASCULAR BALLOON ANGIOPLASTY;  Surgeon: Chuck Hint, MD;  Location: [facility] INVASIVE CV LAB;  Service: Cardiovascular;  Laterality: Right;  Posterior tibial    SHOULDER SURGERY Right    X 2   STUMP REVISION Right 01/09/2021   Procedure: REVISION RIGHT BELOW KNEE AMPUTATION;  Surgeon: Nadara Mustard, MD;  Location: [facility] OR;  Service: Orthopedics;  Laterality: Right;   TONSILLECTOMY  ~ 1948   TOTAL HIP REVISION Right 04/13/2019   Procedure: RIGHT TOTAL HIP REVISION-POSTERIOR  APPROACH LATERAL;  Surgeon: Eldred Manges,  MD;  Location: [facility] OR;  Service: Orthopedics;  Laterality: Right;   TOTAL KNEE ARTHROPLASTY Left 07/23/2016   Procedure: LEFT TOTAL KNEE ARTHROPLASTY;  Surgeon: Eldred Manges, MD;  Location: [facility] OR;  Service: Orthopedics;  Laterality: Left;     A IV Location/Drains/Wounds Patient Lines/Drains/Airways Status     Active Line/Drains/Airways     Name Placement date Placement time Site Days   Peripheral IV 07/03/23 20 G 1" Right Antecubital 07/03/23  1304  Antecubital  1            Intake/Output Last 24 hours  Intake/Output Summary (Last 24 hours) at 07/04/2023 1922 Last data filed at 07/04/2023 1312 Gross per 24 hour  Intake 315 ml  Output 1050 ml  Net -735 ml    Labs/Imaging Results for orders placed or performed during  the hospital encounter of 07/03/23 (from the past 48 hour(s))  Comprehensive metabolic panel     Status: Abnormal   Collection Time: 07/03/23  1:05 PM  Result Value Ref Range   Sodium 136 135 - 145 mmol/L   Potassium 3.7 3.5 - 5.1 mmol/L   Chloride 99 98 - 111 mmol/L   CO2 26 22 - 32 mmol/L   Glucose, Bld 154 (H) 70 - 99 mg/dL    Comment: Glucose reference range applies only to samples taken after fasting for at least 8 hours.   BUN 24 (H) 8 - 23 mg/dL   Creatinine, Ser 4.69 (H) 0.61 - 1.24 mg/dL   Calcium 8.6 (L) 8.9 - 10.3 mg/dL   Total Protein 5.1 (L) 6.5 - 8.1 g/dL   Albumin 2.3 (L) 3.5 - 5.0 g/dL   AST 14 (L) 15 - 41 U/L   ALT 12 0 - 44 U/L   Alkaline Phosphatase 72 38 - 126 U/L   Total Bilirubin 0.6 0.3 - 1.2 mg/dL   GFR, Estimated 52 (L) >60 mL/min    Comment: (NOTE) Calculated using the CKD-EPI Creatinine Equation (2021)    Anion gap 11 5 - 15    Comment: Performed at [facility] Lab, [address] [place], [place] [address]  CBC with Differential     Status: Abnormal   Collection Time: 07/03/23  1:05 PM  Result Value Ref Range   WBC 5.6 4.0 - 10.5 K/uL   RBC 2.47 (L) 4.22 - 5.81 MIL/uL   Hemoglobin 7.2 (L) 13.0 - 17.0 g/dL   HCT 84.1  (L) 32.4 - 52.0 %   MCV 96.4 80.0 - 100.0 fL   MCH 29.1 26.0 - 34.0 pg   MCHC 30.3 30.0 - 36.0 g/dL   RDW 40.1 (H) 02.7 - 25.3 %   Platelets 102 (L) 150 - 400 K/uL    Comment: SPECIMEN CHECKED FOR CLOTS REPEATED TO VERIFY PLATELET COUNT CONFIRMED BY SMEAR    nRBC 0.0 0.0 - 0.2 %   Neutrophils Relative % 67 %   Neutro Abs 3.8 1.7 - 7.7 K/uL   Lymphocytes Relative 21 %   Lymphs Abs 1.2 0.7 - 4.0 K/uL   Monocytes Relative 7 %   Monocytes Absolute 0.4 0.1 - 1.0 K/uL   Eosinophils Relative 4 %   Eosinophils Absolute 0.2 0.0 - 0.5 K/uL   Basophils Relative 0 %   Basophils Absolute 0.0 0.0 - 0.1 K/uL   WBC Morphology MORPHOLOGY UNREMARKABLE    RBC Morphology MORPHOLOGY UNREMARKABLE    Smear Review Normal platelet morphology    Immature Granulocytes 1 %   Abs Immature Granulocytes 0.03 0.00 - 0.07 K/uL    Comment: Performed at [facility], [address], [place], [place] [address]  Brain natriuretic peptide     Status: Abnormal   Collection Time: 07/03/23  1:05 PM  Result Value Ref Range   B Natriuretic Peptide 1,247.5 (H) 0.0 - 100.0 pg/mL    Comment: Performed at [facility] Lab, [address], [place], [place] [address]  Troponin I (High Sensitivity)     Status: Abnormal   Collection Time: 07/03/23  1:05 PM  Result Value Ref Range   Troponin I (High Sensitivity) 19 (H) <18 ng/L    Comment: (NOTE) Elevated high sensitivity troponin I (hsTnI) values and significant  changes across serial measurements may suggest ACS but many other  chronic and acute conditions are known to elevate hsTnI results.  Refer to the "Links" section for chest pain algorithms and additional  guidance. Performed at [facility], [address], [place], [place] [address]   I-Stat CG4 Lactic Acid     Status: None   Collection Time: 07/03/23  1:17 PM  Result Value Ref Range   Lactic Acid, Venous 1.1 0.5 - 1.9 mmol/L  Blood culture (routine x 2)     Status: None (Preliminary result)    Collection Time: 07/03/23  1:23 PM   Specimen: BLOOD  Result Value Ref Range   Specimen Description BLOOD RIGHT ANTECUBITAL    Special Requests      BOTTLES DRAWN AEROBIC AND ANAEROBIC Blood Culture adequate volume   Culture  Setup Time      GRAM POSITIVE COCCI IN CLUSTERS IN BOTH AEROBIC AND ANAEROBIC BOTTLES Organism ID to follow CRITICAL RESULT CALLED TO, READ BACK BY AND VERIFIED WITH: [place] April PHARMD, AT 1144 07/04/23 Performed at [facility] Lab, [address] [place], [place] [address]    Culture GRAM POSITIVE COCCI    Report Status PENDING   Blood Culture ID Panel (Reflexed)     Status: Abnormal   Collection Time: 07/03/23  1:23 PM  Result Value Ref Range   Enterococcus faecalis NOT DETECTED NOT DETECTED   Enterococcus Faecium NOT DETECTED NOT DETECTED   Listeria monocytogenes NOT DETECTED NOT DETECTED   Staphylococcus species DETECTED (A) NOT DETECTED    Comment: CRITICAL RESULT CALLED TO, READ BACK BY AND VERIFIED WITH: A. UTOMWEN PHARMD, AT 1144 07/04/23    Staphylococcus aureus (BCID) NOT DETECTED NOT DETECTED   Staphylococcus epidermidis DETECTED (A) NOT DETECTED    Comment: Methicillin (oxacillin) resistant coagulase negative staphylococcus. Possible blood culture contaminant (unless isolated from more than one blood culture draw or clinical case suggests pathogenicity). No antibiotic treatment is indicated for blood  culture contaminants. CRITICAL RESULT CALLED TO, READ BACK BY AND VERIFIED WITH: A. UTOMWEN PHARMD, AT 1144 07/04/23    Staphylococcus lugdunensis NOT DETECTED NOT DETECTED   Streptococcus species NOT DETECTED NOT DETECTED   Streptococcus agalactiae NOT DETECTED NOT DETECTED   Streptococcus pneumoniae NOT DETECTED NOT DETECTED   Streptococcus pyogenes NOT DETECTED NOT DETECTED   A.calcoaceticus-baumannii NOT DETECTED NOT DETECTED   Bacteroides fragilis NOT DETECTED NOT DETECTED   Enterobacterales NOT DETECTED NOT DETECTED   Enterobacter cloacae  complex NOT DETECTED NOT DETECTED   Escherichia coli NOT DETECTED NOT DETECTED   Klebsiella aerogenes NOT DETECTED NOT DETECTED   Klebsiella oxytoca NOT DETECTED NOT DETECTED   Klebsiella pneumoniae NOT DETECTED NOT DETECTED   Proteus species NOT DETECTED NOT DETECTED   Salmonella species NOT DETECTED NOT DETECTED   Serratia marcescens NOT DETECTED NOT DETECTED   Haemophilus influenzae NOT DETECTED NOT DETECTED   Neisseria meningitidis NOT DETECTED NOT DETECTED   Pseudomonas aeruginosa NOT DETECTED NOT DETECTED   Stenotrophomonas maltophilia NOT DETECTED NOT DETECTED   Candida albicans NOT DETECTED NOT DETECTED   Candida auris NOT DETECTED NOT DETECTED   Candida glabrata NOT DETECTED NOT DETECTED   Candida krusei NOT DETECTED NOT DETECTED   Candida parapsilosis NOT DETECTED NOT DETECTED   Candida tropicalis NOT DETECTED NOT DETECTED   Cryptococcus neoformans/gattii NOT DETECTED NOT DETECTED   Methicillin resistance mecA/C DETECTED (A) NOT DETECTED    Comment: CRITICAL RESULT CALLED TO, READ BACK BY AND VERIFIED WITH: [place] April PHARMD, AT 1144 07/04/23 Performed at [facility] Lab, [address] [place], [place] [address]   Blood culture (  routine x 2)     Status: None (Preliminary result)   Collection Time: 07/03/23  1:50 PM   Specimen: BLOOD RIGHT WRIST  Result Value Ref Range   Specimen Description BLOOD RIGHT WRIST    Special Requests      BOTTLES DRAWN AEROBIC AND ANAEROBIC Blood Culture adequate volume   Culture      NO GROWTH < 24 HOURS Performed at [facility], [address] [place], [place] [address]    Report Status PENDING   Troponin I (High Sensitivity)     Status: Abnormal   Collection Time: 07/03/23  3:17 PM  Result Value Ref Range   Troponin I (High Sensitivity) 22 (H) <18 ng/L    Comment: (NOTE) Elevated high sensitivity troponin I (hsTnI) values and significant  changes across serial measurements may suggest ACS but many other  chronic and acute  conditions are known to elevate hsTnI results.  Refer to the "Links" section for chest pain algorithms and additional  guidance. Performed at [facility] Lab, [address] [place], [place] [address]   Type and screen [facility]     Status: None   Collection Time: 07/03/23  3:17 PM  Result Value Ref Range   ABO/RH(D) O POS    Antibody Screen NEG    Sample Expiration 07/06/2023,2359    Unit Number [id number]    Blood Component Type RED CELLS,LR    Unit division 00    Status of Unit ISSUED,FINAL    Transfusion Status OK TO TRANSFUSE    Crossmatch Result      Compatible Performed at [facility] Lab, [address] [place], [place] [address]   Prepare RBC (crossmatch)     Status: None   Collection Time: 07/03/23  4:32 PM  Result Value Ref Range   Order Confirmation      ORDER PROCESSED BY BLOOD BANK Performed at [facility], [address], [place], [place] [address]   CBG monitoring, ED     Status: Abnormal   Collection Time: 07/03/23  5:37 PM  Result Value Ref Range   Glucose-Capillary 170 (H) 70 - 99 mg/dL    Comment: Glucose reference range applies only to samples taken after fasting for at least 8 hours.   Comment 1 Notify RN    Comment 2 Document in Chart   CBG monitoring, ED     Status: Abnormal   Collection Time: 07/03/23 10:24 PM  Result Value Ref Range   Glucose-Capillary 158 (H) 70 - 99 mg/dL    Comment: Glucose reference range applies only to samples taken after fasting for at least 8 hours.  Ferritin     Status: None   Collection Time: 07/04/23 12:17 AM  Result Value Ref Range   Ferritin 44 24 - 336 ng/mL    Comment: Performed at [facility] Lab, [address] [place], [place] [address]  Iron and TIBC     Status: Abnormal   Collection Time: 07/04/23 12:17 AM  Result Value Ref Range   Iron 31 (L) 45 - 182 ug/dL   TIBC 253 664 - 403 ug/dL   Saturation Ratios 11 (L) 17.9 - 39.5 %   UIBC 262 ug/dL    Comment: Performed  at [facility], [address], [place], [place] [address]  CBC     Status: Abnormal   Collection Time: 07/04/23 12:17 AM  Result Value Ref Range   WBC 5.3  4.0 - 10.5 K/uL   RBC 3.15 (L) 4.22 - 5.81 MIL/uL   Hemoglobin 8.8 (L) 13.0 - 17.0 g/dL   HCT 81.1 (L) 91.4 - 78.2 %   MCV 91.7 80.0 - 100.0 fL   MCH 27.9 26.0 - 34.0 pg   MCHC 30.4 30.0 - 36.0 g/dL   RDW 95.6 (H) 21.3 - 08.6 %   Platelets 96 (L) 150 - 400 K/uL    Comment: Immature Platelet Fraction may be clinically indicated, consider ordering this additional test [id number] REPEATED TO VERIFY    nRBC 0.0 0.0 - 0.2 %    Comment: Performed at [facility] Lab, [address] [place], [place] [address]  CBC     Status: Abnormal   Collection Time: 07/04/23  6:09 AM  Result Value Ref Range   WBC 5.6 4.0 - 10.5 K/uL   RBC 3.68 (L) 4.22 - 5.81 MIL/uL   Hemoglobin 10.1 (L) 13.0 - 17.0 g/dL   HCT 13.2 (L) 44.0 - 10.2 %   MCV 90.8 80.0 - 100.0 fL   MCH 27.4 26.0 - 34.0 pg   MCHC 30.2 30.0 - 36.0 g/dL   RDW 72.5 (H) 36.6 - 44.0 %   Platelets 114 (L) 150 - 400 K/uL    Comment: REPEATED TO VERIFY   nRBC 0.0 0.0 - 0.2 %    Comment: Performed at [facility] Lab, [address] [place], [place] [address]  Magnesium     Status: None   Collection Time: 07/04/23  6:09 AM  Result Value Ref Range   Magnesium 1.9 1.7 - 2.4 mg/dL    Comment: Performed at [facility] Lab, [address], [place], [place] [address]  Comprehensive metabolic panel     Status: Abnormal   Collection Time: 07/04/23  6:09 AM  Result Value Ref Range   Sodium 134 (L) 135 - 145 mmol/L   Potassium 4.0 3.5 - 5.1 mmol/L   Chloride 97 (L) 98 - 111 mmol/L   CO2 29 22 - 32 mmol/L   Glucose, Bld 219 (H) 70 - 99 mg/dL    Comment: Glucose reference range applies only to samples taken after fasting for at least 8 hours.   BUN 27 (H) 8 - 23 mg/dL   Creatinine, Ser 8.75 (H) 0.61 - 1.24 mg/dL   Calcium 8.8 (L) 8.9 - 10.3 mg/dL   Total Protein 5.9 (L) 6.5 -  8.1 g/dL   Albumin 2.6 (L) 3.5 - 5.0 g/dL   AST 15 15 - 41 U/L   ALT 14 0 - 44 U/L   Alkaline Phosphatase 75 38 - 126 U/L   Total Bilirubin 0.8 0.3 - 1.2 mg/dL   GFR, Estimated 44 (L) >60 mL/min    Comment: (NOTE) Calculated using the CKD-EPI Creatinine Equation (2021)    Anion gap 8 5 - 15    Comment: Performed at [facility] Lab, [address], [place], [place] [address]  CBG monitoring, ED     Status: Abnormal   Collection Time: 07/04/23  7:38 AM  Result Value Ref Range   Glucose-Capillary 206 (H) 70 - 99 mg/dL    Comment: Glucose reference range applies only to samples taken after fasting for at least 8 hours.  CBG monitoring, ED     Status: Abnormal   Collection Time: 07/04/23 12:47 PM  Result Value Ref Range   Glucose-Capillary 272 (H) 70 - 99 mg/dL    Comment: Glucose reference range applies only  to samples taken after fasting for at least 8 hours.  CBG monitoring, ED     Status: Abnormal   Collection Time: 07/04/23  6:33 PM  Result Value Ref Range   Glucose-Capillary 246 (H) 70 - 99 mg/dL    Comment: Glucose reference range applies only to samples taken after fasting for at least 8 hours.   ECHOCARDIOGRAM COMPLETE  Result Date: 07/04/2023    ECHOCARDIOGRAM REPORT   Patient Name:   ELPIDIO HEINDL Date of Exam: 07/04/2023 Medical Rec #:  [id number]              Height:       70.0 in Accession #:    [id number]             Weight:       164.0 lb Date of Birth:  1941-08-26              BSA:          1.919 m Patient Age:    81 years               BP:           117/58 mmHg Patient Gender: M                      HR:           73 bpm. Exam Location:  Inpatient Procedure: 2D Echo, Cardiac Doppler and Color Doppler Indications:    dyspnea  History:        Patient has prior history of Echocardiogram examinations. Risk                 Factors:Diabetes, Hypertension and Dyslipidemia.  Sonographer:    Melissa Morford RDCS (AE, PE) Referring Phys: [id number] MARSHALL L CHAMBLISS IMPRESSIONS   1. Left ventricular ejection fraction, by estimation, is 40 to 45%. The left ventricle has mildly decreased function. The left ventricle demonstrates regional wall motion abnormalities (see scoring diagram/findings for description). There is mild left ventricular hypertrophy. Left ventricular diastolic parameters are consistent with Grade I diastolic dysfunction (impaired relaxation).  2. Right ventricular systolic function is normal. The right ventricular size is normal.  3. Left atrial size was mildly dilated.  4. The mitral valve is normal in structure. No evidence of mitral valve regurgitation. No evidence of mitral stenosis.  5. The right coronary and left coronary cusps are fused. The aortic valve is tricuspid. There is moderate calcification of the aortic valve. Aortic valve regurgitation is trivial. Mild to moderate aortic valve stenosis. Aortic valve area, by VTI measures 1.25 cm. Aortic valve mean gradient measures 13.3 mmHg. Aortic valve Vmax measures 2.34 m/s.  6. The inferior vena cava is normal in size with greater than 50% respiratory variability, suggesting right atrial pressure of 3 mmHg. Conclusion(s)/Recommendation(s): EF down from previous echo with new wall motion abnormalities. FINDINGS  Left Ventricle: Left ventricular ejection fraction, by estimation, is 40 to 45%. The left ventricle has mildly decreased function. The left ventricle demonstrates regional wall motion abnormalities. The left ventricular internal cavity size was normal in size. There is mild left ventricular hypertrophy. Left ventricular diastolic parameters are consistent with Grade I diastolic dysfunction (impaired relaxation).  LV Wall Scoring: The mid and distal anterior septum, mid inferolateral segment, apical anterior segment, and apical inferior segment are hypokinetic. Right Ventricle: The right ventricular size is normal. No increase in right ventricular wall thickness. Right ventricular systolic function is normal.  Left  Atrium: Left atrial size was mildly dilated. Right Atrium: Right atrial size was normal in size. Pericardium: There is no evidence of pericardial effusion. Mitral Valve: The mitral valve is normal in structure. No evidence of mitral valve regurgitation. No evidence of mitral valve stenosis. Tricuspid Valve: The tricuspid valve is normal in structure. Tricuspid valve regurgitation is trivial. No evidence of tricuspid stenosis. Aortic Valve: The right coronary and left coronary cusps are fused. The aortic valve is tricuspid. There is moderate calcification of the aortic valve. Aortic valve regurgitation is trivial. Mild to moderate aortic stenosis is present. Aortic valve mean gradient measures 13.3 mmHg. Aortic valve peak gradient measures 21.9 mmHg. Aortic valve area, by VTI measures 1.25 cm. Pulmonic Valve: The pulmonic valve was normal in structure. Pulmonic valve regurgitation is not visualized. No evidence of pulmonic stenosis. Aorta: The aortic root is normal in size and structure. Venous: The inferior vena cava is normal in size with greater than 50% respiratory variability, suggesting right atrial pressure of 3 mmHg. IAS/Shunts: No atrial level shunt detected by color flow Doppler.  LEFT VENTRICLE PLAX 2D LVIDd:         5.20 cm   Diastology LVIDs:         4.00 cm   LV e' medial:    4.24 cm/s LV PW:         1.20 cm   LV E/e' medial:  17.2 LV IVS:        1.30 cm   LV e' lateral:   6.20 cm/s LVOT diam:     2.20 cm   LV E/e' lateral: 11.7 LV SV:         68 LV SV Index:   35 LVOT Area:     3.80 cm  RIGHT VENTRICLE RV S prime:     8.05 cm/s TAPSE (M-mode): 1.8 cm LEFT ATRIUM             Index        RIGHT ATRIUM           Index LA diam:        3.20 cm 1.67 cm/m   RA Area:     13.40 cm LA Vol (A2C):   37.6 ml 19.60 ml/m  RA Volume:   33.30 ml  17.36 ml/m LA Vol (A4C):   54.0 ml 28.15 ml/m LA Biplane Vol: 48.0 ml 25.02 ml/m  AORTIC VALVE AV Area (Vmax):    1.24 cm AV Area (Vmean):   1.37 cm AV Area  (VTI):     1.25 cm AV Vmax:           234.00 cm/s AV Vmean:          174.333 cm/s AV VTI:            0.541 m AV Peak Grad:      21.9 mmHg AV Mean Grad:      13.3 mmHg LVOT Vmax:         76.40 cm/s LVOT Vmean:        62.800 cm/s LVOT VTI:          0.178 m LVOT/AV VTI ratio: 0.33  AORTA Ao Root diam: 3.10 cm Ao Asc diam:  3.40 cm MITRAL VALVE                TRICUSPID VALVE MV Area (PHT): 3.08 cm     TR Peak grad:   24.2 mmHg MV Decel Time: 246 msec     TR Vmax:  246.00 cm/s MV E velocity: 72.80 cm/s MV A velocity: 142.00 cm/s  SHUNTS MV E/A ratio:  0.51         Systemic VTI:  0.18 m                             Systemic Diam: 2.20 cm Arvilla Meres MD Electronically signed by Arvilla Meres MD Signature Date/Time: 07/04/2023/12:48:54 PM    Final    DG Chest Port 1 View  Result Date: 07/03/2023 CLINICAL DATA:  Evaluate pneumonia. EXAM: PORTABLE CHEST 1 VIEW COMPARISON:  06/22/2023 FINDINGS: Stable cardiomediastinal contours. Low lung volumes with asymmetric elevation of the right hemidiaphragm. Opacity in the right lung base is noted which is favored to represent a combination of small pleural effusion with overlying atelectasis or airspace disease. The visualized osseous structures appear intact. IMPRESSION: 1. Low lung volumes with asymmetric elevation of the right hemidiaphragm. 2. Right lung base opacity is favored to represent a combination of small pleural effusion with overlying atelectasis or airspace disease. Electronically Signed   By: Signa Kell M.D.   On: 07/03/2023 13:47    Pending Labs Unresulted Labs (From admission, onward)     Start     Ordered   07/05/23 0500  CBC  Tomorrow morning,   R        07/04/23 1042   07/05/23 0500  Basic metabolic panel  Tomorrow morning,   R        07/04/23 1042            Vitals/Pain Today's Vitals   07/04/23 1245 07/04/23 1330 07/04/23 1700 07/04/23 1900  BP: 115/63 120/60 124/64 (!) 114/54  Pulse: 69 70 72 65  Resp: 14 15 16 16    Temp: 98 F (36.7 C)     TempSrc:      SpO2: 98% 97% 99% 100%  Weight:      Height:      PainSc:        Isolation Precautions No active isolations  Medications Medications  0.9 %  sodium chloride infusion (Manually program via Guardrails IV Fluids) (0 mLs Intravenous Hold 07/03/23 1732)  gabapentin (NEURONTIN) capsule 600 mg (600 mg Oral Given 07/03/23 2344)  gabapentin (NEURONTIN) capsule 400 mg (400 mg Oral Given 07/04/23 1040)  rosuvastatin (CRESTOR) tablet 20 mg (20 mg Oral Given 07/04/23 1054)  tamsulosin (FLOMAX) capsule 0.4 mg (0.4 mg Oral Given 07/04/23 1840)  insulin glargine-yfgn (SEMGLEE) injection 5 Units (5 Units Subcutaneous Given 07/04/23 1040)  insulin aspart (novoLOG) injection 0-9 Units (3 Units Subcutaneous Given 07/04/23 1839)  pantoprazole (PROTONIX) EC tablet 40 mg (40 mg Oral Given 07/04/23 1121)  aspirin EC tablet 81 mg (81 mg Oral Given 07/04/23 1121)  pneumococcal 20-valent conjugate vaccine (PREVNAR 20) injection 0.5 mL (has no administration in time range)  influenza vaccine adjuvanted (FLUAD) injection 0.5 mL (has no administration in time range)  furosemide (LASIX) injection 40 mg (40 mg Intravenous Given 07/03/23 1425)  furosemide (LASIX) injection 40 mg (40 mg Intravenous Given 07/04/23 1041)  furosemide (LASIX) injection 40 mg (40 mg Intravenous Given 07/04/23 1123)    Mobility non-ambulatory     Focused Assessments    R Recommendations: See Admitting Provider Note  Report given to:   Additional Notes: Pt is pleasant, alert, oriented.

## 2023-07-04 NOTE — Progress Notes (Addendum)
Daily Progress Note Intern Pager: [phone]  Patient name: Keith Espinoza Medical record number: [id number] Date of birth: 01/01/1941 Age: 82 y.o. Gender: male  Primary Care Provider: Emilio Aspen, MD Consultants: None Code Status: DNR/DNI  Pt Overview and Major Events to Date:  9/29: Admitted to FMTS  Assessment and Plan: Spero Wehe is a 82 y.o. male with past medical history anemia, HTN, T2DM, CAD, presenting with shortness of breath. Likely secondary to acute CHF exacerbation iso fluid overload, including LLE edema and possible pulmonary fluid.  Assessment & Plan Shortness of breath Patient's SOB likely secondary to acute CHF exacerbation. On exam, patient continues to have persistent 3+ pitting edema of L leg and bibasilar crackles on lung exam. Cxr with possible R pleural effusion. Patient received 40 mg Lasix yesterday evening and reports about half a liter of urine output, recorded at 300 mL. Given limited response to initial diuresis, will attempt again today.  - 80 mg Lasix today - Cont 0.4 mg Flomax - Bladder scan q4 to monitor for urinary retention  - Strict I/Os - Daily weights - F/u echocardiogram  - AM BMP, CBC Edema, unspecified type Patient's pitting edema of L leg likely secondary to post-surgical lymphedema and fluid retention iso acute CHF exacerbation. Per patient, the leg leg appears unchanged from the time of procedure.  - 80 mg Lasix today  - Keep L leg elevated - TED hoe for L leg - AM BMP, CBC Iron deficiency anemia, unspecified iron deficiency anemia type Arrival Hgb 7.2 > 1 unit pRBC > 8.8 > 10.1 this morning. Patient's baseline appears around 9 since July 2024. Patient worked up for hematemesis 3 weeks prior with unremarkable EGD. No new bleeding symptoms at this time.  - Restart home aspirin 81, hold plavix 75 - Transition to PO PPI (Protonix) - Transfuse for Hgb <8 - CTM for bleeding symptoms, consider GI consult if  needed - AM CBC Type 2 diabetes mellitus with diabetic polyneuropathy, with long-term current use of insulin (HCC) Takes 12U Tresiba in AM, Jardiance 25mg  every day, Metformin 1000mg  BID, Pioglitazone 30mg  every day at home. CBGs inpatient have largely been 150-200s.  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 - Reach out to patient's outpatient ortho provider about re-refitting patient's prosthetic - CTM   Chronic and Stable Issues: HLD: cont Crestor 20 mg GERD: cont protonix 40 mg BPH: Flomax 0.4mg , holding home terazosin iso low BP CAD: Restart home ASA 81, cont holding home plavix 75 iso bleed risk. Consider restarting home plavix tomorrow if Hgb remains stable.  Neuropathy: Continue home gabapentin morning 400, nighttime 600   FEN/GI: Carb modified diet PPx: If Hgb remains stable tomorrow, consider Lovenox  Dispo: Home pending clinical improvement   Subjective:  Patient is feeling fine this morning, has not noticed any changes since yesterday.  He is not sure if his weakness has been improving or not as he has not been able to ambulate.  No CP, SOB, HA, or VC.  Eating well, no N/V/D.  He feels his left leg swelling is consistent with how it has been since his procedure in August.  Objective: Temp:  [97.4 F (36.3 C)-98 F (36.7 C)] 98 F (36.7 C) (09/30 1245) Pulse Rate:  [48-87] 69 (09/30 1245) Resp:  [10-19] 14 (09/30 1245) BP: (93-129)/(53-77) 115/63 (09/30 1245) SpO2:  [93 %-100 %]  98 % (09/30 1245) Physical Exam: General: Well-appearing, eating breakfast, no acute distress Cardiovascular: Normal S1/S2.  No extra heart sounds.  Warm and well-perfused. Respiratory: Breathing comfortably on room air.  Bibasilar crackles.  No increased WOB. Abdomen: Soft, nontender Extremities: 3+ pitting of entire left leg.  Dark violaceous 4-5cm hematoma of medial  calf.    Laboratory: Most recent CBC Lab Results  Component Value Date   WBC 5.6 07/04/2023   HGB 10.1 (L) 07/04/2023   HCT 33.4 (L) 07/04/2023   MCV 90.8 07/04/2023   PLT 114 (L) 07/04/2023   Most recent BMP    Latest Ref Rng & Units 07/04/2023    6:09 AM  BMP  Glucose 70 - 99 mg/dL 161   BUN 8 - 23 mg/dL 27   Creatinine 0.96 - 1.24 mg/dL 0.45   Sodium 409 - 811 mmol/L 134   Potassium 3.5 - 5.1 mmol/L 4.0   Chloride 98 - 111 mmol/L 97   CO2 22 - 32 mmol/L 29   Calcium 8.9 - 10.3 mg/dL 8.8    Ivery Quale, MD 07/04/2023, 1:12 PM  PGY-1, [facility] FPTS Intern pager: [phone], text pages welcome Secure chat group [facility] Teaching Service

## 2023-07-04 NOTE — Assessment & Plan Note (Addendum)
Takes 12U Tresiba in AM, Jardiance 25mg  every day, Metformin 1000mg  BID, Pioglitazone 30mg  every day at home. CBGs inpatient have largely been 150-200s.  - Semglee 5 daily - Sensitive SSI - CBGs QID

## 2023-07-05 DIAGNOSIS — K2971 Gastritis, unspecified, with bleeding: Secondary | ICD-10-CM | POA: Diagnosis not present

## 2023-07-05 DIAGNOSIS — R0602 Shortness of breath: Secondary | ICD-10-CM | POA: Diagnosis not present

## 2023-07-05 DIAGNOSIS — H353211 Exudative age-related macular degeneration, right eye, with active choroidal neovascularization: Secondary | ICD-10-CM | POA: Diagnosis not present

## 2023-07-05 DIAGNOSIS — M109 Gout, unspecified: Secondary | ICD-10-CM | POA: Diagnosis not present

## 2023-07-05 DIAGNOSIS — R600 Localized edema: Secondary | ICD-10-CM | POA: Diagnosis not present

## 2023-07-05 DIAGNOSIS — I252 Old myocardial infarction: Secondary | ICD-10-CM | POA: Diagnosis not present

## 2023-07-05 DIAGNOSIS — N138 Other obstructive and reflux uropathy: Secondary | ICD-10-CM | POA: Diagnosis not present

## 2023-07-05 DIAGNOSIS — E7800 Pure hypercholesterolemia, unspecified: Secondary | ICD-10-CM | POA: Diagnosis not present

## 2023-07-05 DIAGNOSIS — Z89511 Acquired absence of right leg below knee: Secondary | ICD-10-CM | POA: Diagnosis not present

## 2023-07-05 DIAGNOSIS — Z8673 Personal history of transient ischemic attack (TIA), and cerebral infarction without residual deficits: Secondary | ICD-10-CM | POA: Diagnosis not present

## 2023-07-05 DIAGNOSIS — E871 Hypo-osmolality and hyponatremia: Secondary | ICD-10-CM | POA: Diagnosis not present

## 2023-07-05 DIAGNOSIS — M9702XD Periprosthetic fracture around internal prosthetic left hip joint, subsequent encounter: Secondary | ICD-10-CM | POA: Diagnosis not present

## 2023-07-05 DIAGNOSIS — Z794 Long term (current) use of insulin: Secondary | ICD-10-CM | POA: Diagnosis not present

## 2023-07-05 DIAGNOSIS — G4700 Insomnia, unspecified: Secondary | ICD-10-CM | POA: Diagnosis not present

## 2023-07-05 DIAGNOSIS — Z602 Problems related to living alone: Secondary | ICD-10-CM | POA: Diagnosis not present

## 2023-07-05 DIAGNOSIS — I2510 Atherosclerotic heart disease of native coronary artery without angina pectoris: Secondary | ICD-10-CM | POA: Diagnosis not present

## 2023-07-05 DIAGNOSIS — N179 Acute kidney failure, unspecified: Secondary | ICD-10-CM | POA: Diagnosis not present

## 2023-07-05 DIAGNOSIS — S72452D Displaced supracondylar fracture without intracondylar extension of lower end of left femur, subsequent encounter for closed fracture with routine healing: Secondary | ICD-10-CM | POA: Diagnosis not present

## 2023-07-05 DIAGNOSIS — E1122 Type 2 diabetes mellitus with diabetic chronic kidney disease: Secondary | ICD-10-CM | POA: Diagnosis not present

## 2023-07-05 DIAGNOSIS — E1165 Type 2 diabetes mellitus with hyperglycemia: Secondary | ICD-10-CM | POA: Diagnosis not present

## 2023-07-05 DIAGNOSIS — E1151 Type 2 diabetes mellitus with diabetic peripheral angiopathy without gangrene: Secondary | ICD-10-CM | POA: Diagnosis not present

## 2023-07-05 DIAGNOSIS — I509 Heart failure, unspecified: Secondary | ICD-10-CM

## 2023-07-05 DIAGNOSIS — D509 Iron deficiency anemia, unspecified: Secondary | ICD-10-CM | POA: Diagnosis present

## 2023-07-05 DIAGNOSIS — I130 Hypertensive heart and chronic kidney disease with heart failure and stage 1 through stage 4 chronic kidney disease, or unspecified chronic kidney disease: Secondary | ICD-10-CM | POA: Diagnosis not present

## 2023-07-05 DIAGNOSIS — N1831 Chronic kidney disease, stage 3a: Secondary | ICD-10-CM | POA: Diagnosis not present

## 2023-07-05 DIAGNOSIS — H353132 Nonexudative age-related macular degeneration, bilateral, intermediate dry stage: Secondary | ICD-10-CM | POA: Diagnosis not present

## 2023-07-05 DIAGNOSIS — N401 Enlarged prostate with lower urinary tract symptoms: Secondary | ICD-10-CM | POA: Diagnosis not present

## 2023-07-05 DIAGNOSIS — Z993 Dependence on wheelchair: Secondary | ICD-10-CM | POA: Diagnosis not present

## 2023-07-05 DIAGNOSIS — I5020 Unspecified systolic (congestive) heart failure: Secondary | ICD-10-CM | POA: Diagnosis not present

## 2023-07-05 HISTORY — DX: Localized edema: R60.0

## 2023-07-05 LAB — CBC
HCT: 28.8 % — ABNORMAL LOW (ref 39.0–52.0)
Hemoglobin: 9 g/dL — ABNORMAL LOW (ref 13.0–17.0)
MCH: 27.7 pg (ref 26.0–34.0)
MCHC: 31.3 g/dL (ref 30.0–36.0)
MCV: 88.6 fL (ref 80.0–100.0)
Platelets: 104 10*3/uL — ABNORMAL LOW (ref 150–400)
RBC: 3.25 MIL/uL — ABNORMAL LOW (ref 4.22–5.81)
RDW: 20.7 % — ABNORMAL HIGH (ref 11.5–15.5)
WBC: 6 10*3/uL (ref 4.0–10.5)
nRBC: 0 % (ref 0.0–0.2)

## 2023-07-05 LAB — BASIC METABOLIC PANEL
Anion gap: 6 (ref 5–15)
BUN: 31 mg/dL — ABNORMAL HIGH (ref 8–23)
CO2: 30 mmol/L (ref 22–32)
Calcium: 8.4 mg/dL — ABNORMAL LOW (ref 8.9–10.3)
Chloride: 97 mmol/L — ABNORMAL LOW (ref 98–111)
Creatinine, Ser: 1.56 mg/dL — ABNORMAL HIGH (ref 0.61–1.24)
GFR, Estimated: 44 mL/min — ABNORMAL LOW (ref >60)
Glucose, Bld: 205 mg/dL — ABNORMAL HIGH (ref 70–99)
Potassium: 3.6 mmol/L (ref 3.5–5.1)
Sodium: 133 mmol/L — ABNORMAL LOW (ref 135–145)

## 2023-07-05 LAB — GLUCOSE, CAPILLARY
Glucose-Capillary: 218 mg/dL — ABNORMAL HIGH (ref 70–99)
Glucose-Capillary: 409 mg/dL — ABNORMAL HIGH (ref 70–99)
Glucose-Capillary: 443 mg/dL — ABNORMAL HIGH (ref 70–99)
Glucose-Capillary: 414 mg/dL — ABNORMAL HIGH (ref 70–99)
Glucose-Capillary: 422 mg/dL — ABNORMAL HIGH (ref 70–99)
Glucose-Capillary: 373 mg/dL — ABNORMAL HIGH (ref 70–99)
Glucose-Capillary: 189 mg/dL — ABNORMAL HIGH (ref 70–99)

## 2023-07-05 LAB — HEMOGLOBIN AND HEMATOCRIT, BLOOD
HCT: 30.9 % — ABNORMAL LOW (ref 39.0–52.0)
Hemoglobin: 9.8 g/dL — ABNORMAL LOW (ref 13.0–17.0)

## 2023-07-05 MED ORDER — INSULIN ASPART 100 UNIT/ML IJ SOLN
6.0000 [IU] | Freq: Once | INTRAMUSCULAR | Status: AC
  Administered 2023-07-05: 6 [IU] via SUBCUTANEOUS

## 2023-07-05 MED ORDER — FUROSEMIDE 10 MG/ML IJ SOLN
80.0000 mg | Freq: Once | INTRAMUSCULAR | Status: AC
  Administered 2023-07-05: 80 mg via INTRAVENOUS
  Filled 2023-07-05: qty 8

## 2023-07-05 MED ORDER — SODIUM CHLORIDE 0.9 % IV SOLN: 100.0000 mg | Freq: Once | INTRAVENOUS | Status: DC

## 2023-07-05 MED ORDER — SODIUM CHLORIDE 0.9 % IV SOLN
250.0000 mg | Freq: Once | INTRAVENOUS | Status: AC
  Administered 2023-07-05: 250 mg via INTRAVENOUS
  Filled 2023-07-05: qty 12.5

## 2023-07-05 MED ORDER — INSULIN ASPART 100 UNIT/ML IJ SOLN
7.0000 [IU] | Freq: Once | INTRAMUSCULAR | Status: AC
  Administered 2023-07-05: 7 [IU] via SUBCUTANEOUS

## 2023-07-05 MED ORDER — INSULIN GLARGINE-YFGN 100 UNIT/ML ~~LOC~~ SOLN
5.0000 [IU] | Freq: Once | SUBCUTANEOUS | Status: AC
  Administered 2023-07-05: 5 [IU] via SUBCUTANEOUS
  Filled 2023-07-05: qty 0.05

## 2023-07-05 MED ORDER — INSULIN GLARGINE-YFGN 100 UNIT/ML ~~LOC~~ SOLN
10.0000 [IU] | Freq: Every day | SUBCUTANEOUS | Status: DC
  Administered 2023-07-06 – 2023-07-08 (×3): 10 [IU] via SUBCUTANEOUS
  Filled 2023-07-05 (×4): qty 0.1

## 2023-07-05 NOTE — Consult Note (Signed)
ORTHOPAEDIC CONSULTATION  REQUESTING PHYSICIAN: Carney Living, MD  Chief Complaint: Swelling both lower extremities with blistering of the right below-knee amputation.  HPI: Keith Espinoza is a 82 y.o. male who presents with fluid overload with pitting edema both lower extremities and blistering of the right below-knee amputation.  Patient states he has been unable to weight-bear on the left Lower extremity and has been unable to wear the prosthesis on the right secondary to swelling.  Past Medical History:  Diagnosis Date   Allergic rhinitis    Allergic rhinitis    Arthritis    Basal cell carcinoma 11/01/2019    bcc left chest treatment TX [id number]    Chronic leg pain    right   Chronic lower back pain    Coronary artery disease    a. Stenting to RCA 2004; staged DES to LAD and Cx 2004. DES to [facility] 2012. b. DES to mCx, PTCA to dCx 11/2011. c. Lateral wall MI 2013 s/p PTCA to distal Cx & DES to mid OM2 11/2011. d. Low risk nuc 04/2014, EF wnl.   COVID-19    Diabetes mellitus    Insulin dependent   Diabetic neuropathy (HCC)    MILD   Diverticulosis    Dysrhythmia    [name] syndrome    Gout    right wrist; right foot; right elbow; have had it since 1970's   H/O hiatal hernia    Heart murmur    History of echocardiogram    aortic sclerosis per echo 12/09 EF 65%, otherwise normal   History of hemorrhoids    BLEEDING   History of kidney stones    h/o   Hypertension    Diagnosed 1995    Myocardial infarction [facility])    Pancreatic pseudocyst    a. s/p remote drainage 2006.   Thrombocytopenia (HCC)    Seen on oldest labs in system from 2004   Vitamin B 12 deficiency    orally replaced   Past Surgical History:  Procedure Laterality Date   ABDOMINAL AORTOGRAM W/LOWER EXTREMITY Bilateral 08/08/2020   Procedure: ABDOMINAL AORTOGRAM W/LOWER EXTREMITY;  Surgeon: Chuck Hint, MD;  Location: [facility] INVASIVE CV LAB;  Service: Cardiovascular;  Laterality:  Bilateral;   AMPUTATION Right 12/12/2020   Procedure: RIGHT BELOW KNEE AMPUTATION;  Surgeon: Nadara Mustard, MD;  Location: [facility] OR;  Service: Orthopedics;  Laterality: Right;   BACK SURGERY     "total of 3 times" S/P fall    CARPAL TUNNEL RELEASE Bilateral    CHOLECYSTECTOMY  1990's   COLONOSCOPY     CORONARY ANGIOPLASTY  11/11/11   CORONARY ANGIOPLASTY WITH STENT PLACEMENT  09/30/2011   "1 then; makes a total of 4"   CORONARY ANGIOPLASTY WITH STENT PLACEMENT  11/11/11   "1; makes a total of 5"   INGUINAL HERNIA REPAIR  2003   right   JOINT REPLACEMENT Right 04/03/2002   hip replacment   KNEE ARTHROSCOPY  1990's   left   LEFT HEART CATH AND CORONARY ANGIOGRAPHY N/A 04/25/2023   Procedure: LEFT HEART CATH AND CORONARY ANGIOGRAPHY;  Surgeon: Marykay Lex, MD;  Location: [facility] INVASIVE CV LAB;  Service: Cardiovascular;  Laterality: N/A;   LEFT HEART CATHETERIZATION WITH CORONARY ANGIOGRAM N/A 09/30/2011   Procedure: LEFT HEART CATHETERIZATION WITH CORONARY ANGIOGRAM;  Surgeon: Corky Crafts, MD;  Location: [facility] CATH LAB;  Service: Cardiovascular;  Laterality: N/A;  possible PCI   LEFT HEART CATHETERIZATION WITH CORONARY ANGIOGRAM N/A  11/15/2011   Procedure: LEFT HEART CATHETERIZATION WITH CORONARY ANGIOGRAM;  Surgeon: Corky Crafts, MD;  Location: [facility] CATH LAB;  Service: Cardiovascular;  Laterality: N/A;   PERCUTANEOUS CORONARY STENT INTERVENTION (PCI-S)  09/30/2011   Procedure: PERCUTANEOUS CORONARY STENT INTERVENTION (PCI-S);  Surgeon: Corky Crafts, MD;  Location: [facility] CATH LAB;  Service: Cardiovascular;;   PERCUTANEOUS CORONARY STENT INTERVENTION (PCI-S) N/A 11/11/2011   Procedure: PERCUTANEOUS CORONARY STENT INTERVENTION (PCI-S);  Surgeon: Corky Crafts, MD;  Location: [facility] CATH LAB;  Service: Cardiovascular;  Laterality: N/A;   PERIPHERAL VASCULAR BALLOON ANGIOPLASTY Right 08/08/2020   Procedure: PERIPHERAL VASCULAR BALLOON ANGIOPLASTY;  Surgeon: Chuck Hint, MD;   Location: [facility] INVASIVE CV LAB;  Service: Cardiovascular;  Laterality: Right;  Posterior tibial    SHOULDER SURGERY Right    X 2   STUMP REVISION Right 01/09/2021   Procedure: REVISION RIGHT BELOW KNEE AMPUTATION;  Surgeon: Nadara Mustard, MD;  Location: [facility] OR;  Service: Orthopedics;  Laterality: Right;   TONSILLECTOMY  ~ 1948   TOTAL HIP REVISION Right 04/13/2019   Procedure: RIGHT TOTAL HIP REVISION-POSTERIOR  APPROACH LATERAL;  Surgeon: Eldred Manges, MD;  Location: [facility] OR;  Service: Orthopedics;  Laterality: Right;   TOTAL KNEE ARTHROPLASTY Left 07/23/2016   Procedure: LEFT TOTAL KNEE ARTHROPLASTY;  Surgeon: Eldred Manges, MD;  Location: [facility] OR;  Service: Orthopedics;  Laterality: Left;   Social History   Socioeconomic History   Marital status: Married    Spouse name: Not on file   Number of children: Not on file   Years of education: Not on file   Highest education level: Not on file  Occupational History   Occupation: Retired  Tobacco Use   Smoking status: Former   Smokeless tobacco: Current    Types: Chew   Tobacco comments:    quit 60 years ago  Vaping Use   Vaping status: Never Used  Substance and Sexual Activity   Alcohol use: No   Drug use: No   Sexual activity: Not Currently  Other Topics Concern   Not on file  Social History Narrative   Not on file   Social Determinants of Health   Financial Resource Strain: Low Risk  (05/16/2023)   Received from [facility]   Overall Financial Resource Strain (CARDIA)    Difficulty of Paying Living Expenses: Not very hard  Food Insecurity: No Food Insecurity (07/04/2023)   Hunger Vital Sign    Worried About Running Out of Food in the Last Year: Never true    Ran Out of Food in the Last Year: Never true  Transportation Needs: No Transportation Needs (07/05/2023)   PRAPARE - Administrator, Civil Service (Medical): No    Lack of Transportation (Non-Medical): No  Physical Activity: Not on file  Stress: No Stress  Concern Present (06/12/2023)   Received from [facility] - Occupational Stress Questionnaire    Feeling of Stress : Not at all  Social Connections: Unknown (05/12/2023)   [facility]   Social Network    Social Network: Not on file   Family History  Problem Relation Age of Onset   Diabetes Mother    Hyperlipidemia Mother    Hypertension Mother    Cancer Father    Hypertension Father    Diabetes Sister    Hypertension Sister    Cancer Brother    Heart attack Neg Hx    - negative  except otherwise stated in the family history section Allergies  Allergen Reactions   Simvastatin Other (See Comments)    SEVERE MYALGIAS    Zetia [Ezetimibe] Other (See Comments)    MYALGIAS   Dilaudid [Hydromorphone Hcl] Other (See Comments)    hallucination   Prior to Admission medications   Medication Sig Start Date End Date Taking? Authorizing Provider  acetaminophen (TYLENOL) 500 MG tablet Take 1,000 mg by mouth every 6 (six) hours as needed (pain).   Yes [provider]  allopurinol (ZYLOPRIM) 300 MG tablet Take 300 mg by mouth daily.   Yes [provider]  amLODipine (NORVASC) 10 MG tablet Take 1 tablet (10 mg total) by mouth daily. 04/28/23  Yes Azucena Fallen, MD  Ascorbic Acid (VITAMIN C PO) Take 1 tablet by mouth at bedtime.   Yes [provider]  aspirin EC 81 MG tablet Take 81 mg by mouth daily.   Yes [provider]  Cholecalciferol (VITAMIN D3) 50 MCG (2000 UT) TABS Take 2,000 Units by mouth at bedtime.   Yes [provider]  clopidogrel (PLAVIX) 75 MG tablet Take 1 tablet (75 mg total) by mouth daily. 12/26/20  Yes Love, Evlyn Kanner, PA-C  clotrimazole-betamethasone (LOTRISONE) cream Apply topically 2 (two) times daily. 06/16/23 06/15/24 Yes [provider]  Cyanocobalamin (B-12) 2500 MCG TABS Take 2,500 mcg by mouth daily.   Yes [provider]  empagliflozin  (JARDIANCE) 25 MG TABS tablet TAKE 1 TABLET(25 MG) BY MOUTH DAILY 06/01/23  Yes [name], Konrad Dolores, MD  furosemide (LASIX) 20 MG tablet Take 1 tablet (20 mg total) by mouth daily for 10 days. 06/22/23 07/03/23 Yes Countryman, Almeta Monas, MD  gabapentin (NEURONTIN) 300 MG capsule Take 600 mg by mouth at bedtime.   Yes [provider]  gabapentin (NEURONTIN) 400 MG capsule Take 1 capsule (400 mg total) by mouth 3 (three) times daily. Patient taking differently: Take 400 mg by mouth daily. 12/26/20  Yes Love, Evlyn Kanner, PA-C  insulin aspart (NOVOLOG FLEXPEN) 100 UNIT/ML FlexPen Max daily 30 units Patient taking differently: Inject 2-5 Units into the skin daily. Per sliding scale 06/23/22  Yes [name], Konrad Dolores, MD  insulin degludec (TRESIBA FLEXTOUCH) 100 UNIT/ML FlexTouch Pen Inject 12 Units into the skin daily. 11/11/22  Yes [name], Konrad Dolores, MD  isosorbide mononitrate (IMDUR) 60 MG 24 hr tablet Take 1 tablet (60 mg total) by mouth daily. 04/28/23  Yes Azucena Fallen, MD  losartan (COZAAR) 25 MG tablet Take 1 tablet (25 mg total) by mouth daily. 04/28/23  Yes Azucena Fallen, MD  MAGNESIUM PO Take 1 tablet by mouth at bedtime.   Yes [provider]  metFORMIN (GLUCOPHAGE) 1000 MG tablet Take 1 tablet (1,000 mg total) by mouth 2 (two) times daily. 02/12/22  Yes [name], Konrad Dolores, MD  metoprolol tartrate (LOPRESSOR) 25 MG tablet Take 25 mg by mouth daily.   Yes [provider]  nitroGLYCERIN (NITROSTAT) 0.4 MG SL tablet Place 0.4 mg under the tongue every 5 (five) minutes x 3 doses as needed for chest pain.    Yes [provider]  Omega-3 Fatty Acids (FISH OIL) 1200 MG CAPS Take by mouth.   Yes [provider]  pantoprazole (PROTONIX) 40 MG tablet Take 40 mg by mouth daily. 06/16/23  Yes [provider]  pioglitazone (ACTOS) 30 MG tablet TAKE 1 TABLET(30 MG) BY MOUTH DAILY 04/06/23  Yes [name], Konrad Dolores, MD   Potassium Chloride ER 20 MEQ TBCR  Take 1 tablet by mouth daily. 06/02/23  Yes [provider]  rosuvastatin (CRESTOR) 20 MG tablet Take 1 tablet (20 mg total) by mouth daily. 04/28/23  Yes Azucena Fallen, MD  senna-docusate (SENOKOT-S) 8.6-50 MG tablet Take 1 tablet by mouth at bedtime. Patient taking differently: Take 1 tablet by mouth 2 (two) times daily. 02/03/21  Yes Vassie Loll, MD  terazosin (HYTRIN) 5 MG capsule Take 1 capsule (5 mg total) by mouth at bedtime. Patient taking differently: Take 5 mg by mouth 2 (two) times daily. 12/26/20  Yes Love, Evlyn Kanner, PA-C  traZODone (DESYREL) 50 MG tablet Take 50 mg by mouth at bedtime. 06/02/23  Yes [provider]  Turmeric (QC TUMERIC COMPLEX PO) Take 1 capsule by mouth at bedtime.   Yes [provider]  amoxicillin-clavulanate (AUGMENTIN) 875-125 MG tablet Take 1 tablet by mouth every 12 (twelve) hours. Patient not taking: Reported on 07/03/2023 06/22/23   Glyn Ade, MD  Continuous Blood Gluc Sensor (DEXCOM G7 SENSOR) MISC 1 Device by Does not apply route as directed. 09/20/22   [name], Konrad Dolores, MD  Insulin Pen Needle 32G X 4 MM MISC 1 Device by Does not apply route in the morning, at noon, in the evening, and at bedtime. 06/23/22   [name], Konrad Dolores, MD   ECHOCARDIOGRAM COMPLETE  Result Date: 07/04/2023    ECHOCARDIOGRAM REPORT   Patient Name:   Keith Espinoza Date of Exam: 07/04/2023 Medical Rec #:  [id number]              Height:       70.0 in Accession #:    [id number]             Weight:       164.0 lb Date of Birth:  1941-04-22              BSA:          1.919 m Patient Age:    81 years               BP:           117/58 mmHg Patient Gender: M                      HR:           73 bpm. Exam Location:  Inpatient Procedure: 2D Echo, Cardiac Doppler and Color Doppler Indications:    dyspnea  History:        Patient has prior history of Echocardiogram examinations. Risk                  Factors:Diabetes, Hypertension and Dyslipidemia.  Sonographer:    Melissa Morford RDCS (AE, PE) Referring Phys: [id number] MARSHALL L CHAMBLISS IMPRESSIONS  1. Left ventricular ejection fraction, by estimation, is 40 to 45%. The left ventricle has mildly decreased function. The left ventricle demonstrates regional wall motion abnormalities (see scoring diagram/findings for description). There is mild left ventricular hypertrophy. Left ventricular diastolic parameters are consistent with Grade I diastolic dysfunction (impaired relaxation).  2. Right ventricular systolic function is normal. The right ventricular size is normal.  3. Left atrial size was mildly dilated.  4. The mitral valve is normal in structure. No evidence of mitral valve regurgitation. No evidence of mitral stenosis.  5. The right coronary and left coronary cusps are fused. The aortic valve is tricuspid. There is moderate calcification of the aortic valve. Aortic valve regurgitation is trivial. Mild  to moderate aortic valve stenosis. Aortic valve area, by VTI measures 1.25 cm. Aortic valve mean gradient measures 13.3 mmHg. Aortic valve Vmax measures 2.34 m/s.  6. The inferior vena cava is normal in size with greater than 50% respiratory variability, suggesting right atrial pressure of 3 mmHg. Conclusion(s)/Recommendation(s): EF down from previous echo with new wall motion abnormalities. FINDINGS  Left Ventricle: Left ventricular ejection fraction, by estimation, is 40 to 45%. The left ventricle has mildly decreased function. The left ventricle demonstrates regional wall motion abnormalities. The left ventricular internal cavity size was normal in size. There is mild left ventricular hypertrophy. Left ventricular diastolic parameters are consistent with Grade I diastolic dysfunction (impaired relaxation).  LV Wall Scoring: The mid and distal anterior septum, mid inferolateral segment, apical anterior segment, and apical inferior segment are hypokinetic.  Right Ventricle: The right ventricular size is normal. No increase in right ventricular wall thickness. Right ventricular systolic function is normal. Left Atrium: Left atrial size was mildly dilated. Right Atrium: Right atrial size was normal in size. Pericardium: There is no evidence of pericardial effusion. Mitral Valve: The mitral valve is normal in structure. No evidence of mitral valve regurgitation. No evidence of mitral valve stenosis. Tricuspid Valve: The tricuspid valve is normal in structure. Tricuspid valve regurgitation is trivial. No evidence of tricuspid stenosis. Aortic Valve: The right coronary and left coronary cusps are fused. The aortic valve is tricuspid. There is moderate calcification of the aortic valve. Aortic valve regurgitation is trivial. Mild to moderate aortic stenosis is present. Aortic valve mean gradient measures 13.3 mmHg. Aortic valve peak gradient measures 21.9 mmHg. Aortic valve area, by VTI measures 1.25 cm. Pulmonic Valve: The pulmonic valve was normal in structure. Pulmonic valve regurgitation is not visualized. No evidence of pulmonic stenosis. Aorta: The aortic root is normal in size and structure. Venous: The inferior vena cava is normal in size with greater than 50% respiratory variability, suggesting right atrial pressure of 3 mmHg. IAS/Shunts: No atrial level shunt detected by color flow Doppler.  LEFT VENTRICLE PLAX 2D LVIDd:         5.20 cm   Diastology LVIDs:         4.00 cm   LV e' medial:    4.24 cm/s LV PW:         1.20 cm   LV E/e' medial:  17.2 LV IVS:        1.30 cm   LV e' lateral:   6.20 cm/s LVOT diam:     2.20 cm   LV E/e' lateral: 11.7 LV SV:         68 LV SV Index:   35 LVOT Area:     3.80 cm  RIGHT VENTRICLE RV S prime:     8.05 cm/s TAPSE (M-mode): 1.8 cm LEFT ATRIUM             Index        RIGHT ATRIUM           Index LA diam:        3.20 cm 1.67 cm/m   RA Area:     13.40 cm LA Vol (A2C):   37.6 ml 19.60 ml/m  RA Volume:   33.30 ml  17.36 ml/m LA  Vol (A4C):   54.0 ml 28.15 ml/m LA Biplane Vol: 48.0 ml 25.02 ml/m  AORTIC VALVE AV Area (Vmax):    1.24 cm AV Area (Vmean):   1.37 cm AV Area (VTI):  1.25 cm AV Vmax:           234.00 cm/s AV Vmean:          174.333 cm/s AV VTI:            0.541 m AV Peak Grad:      21.9 mmHg AV Mean Grad:      13.3 mmHg LVOT Vmax:         76.40 cm/s LVOT Vmean:        62.800 cm/s LVOT VTI:          0.178 m LVOT/AV VTI ratio: 0.33  AORTA Ao Root diam: 3.10 cm Ao Asc diam:  3.40 cm MITRAL VALVE                TRICUSPID VALVE MV Area (PHT): 3.08 cm     TR Peak grad:   24.2 mmHg MV Decel Time: 246 msec     TR Vmax:        246.00 cm/s MV E velocity: 72.80 cm/s MV A velocity: 142.00 cm/s  SHUNTS MV E/A ratio:  0.51         Systemic VTI:  0.18 m                             Systemic Diam: 2.20 cm Arvilla Meres MD Electronically signed by Arvilla Meres MD Signature Date/Time: 07/04/2023/12:48:54 PM    Final    DG Chest Port 1 View  Result Date: 07/03/2023 CLINICAL DATA:  Evaluate pneumonia. EXAM: PORTABLE CHEST 1 VIEW COMPARISON:  06/22/2023 FINDINGS: Stable cardiomediastinal contours. Low lung volumes with asymmetric elevation of the right hemidiaphragm. Opacity in the right lung base is noted which is favored to represent a combination of small pleural effusion with overlying atelectasis or airspace disease. The visualized osseous structures appear intact. IMPRESSION: 1. Low lung volumes with asymmetric elevation of the right hemidiaphragm. 2. Right lung base opacity is favored to represent a combination of small pleural effusion with overlying atelectasis or airspace disease. Electronically Signed   By: Signa Kell M.D.   On: 07/03/2023 13:47   - pertinent xrays, CT, MRI studies were reviewed and independently interpreted  Positive ROS: All other systems have been reviewed and were otherwise negative with the exception of those mentioned in the HPI and as above.  Physical Exam: General: Alert, no acute  distress Psychiatric: Patient is competent for consent with normal mood and affect Lymphatic: No axillary or cervical lymphadenopathy Cardiovascular: No pedal edema Respiratory: No cyanosis, no use of accessory musculature GI: No organomegaly, abdomen is soft and non-tender    Images:  @ENCIMAGES @  Labs:  Lab Results  Component Value Date   HGBA1C 7.1 (A) 04/01/2023   HGBA1C 7.2 (A) 11/11/2022   HGBA1C 9.0 (A) 06/23/2022   REPTSTATUS PENDING 07/03/2023   GRAMSTAIN  04/13/2019    RARE WBC PRESENT, PREDOMINANTLY PMN NO ORGANISMS SEEN    CULT  07/03/2023    NO GROWTH 2 DAYS Performed at [facility], [address] [place], [place] [address]     Lab Results  Component Value Date   ALBUMIN 2.6 (L) 07/04/2023   ALBUMIN 2.3 (L) 07/03/2023   ALBUMIN 3.4 (L) 04/24/2023        Latest Ref Rng & Units 07/05/2023    3:02 AM 07/04/2023    6:09 AM 07/04/2023   12:17 AM  CBC EXTENDED  WBC 4.0 - 10.5 K/uL 6.0  5.6  5.3   RBC 4.22 - 5.81 MIL/uL 3.25  3.68  3.15   Hemoglobin 13.0 - 17.0 g/dL 9.0  16.1  8.8   HCT 09.6 - 52.0 % 28.8  33.4  28.9   Platelets 150 - 400 K/uL 104  114  96     Neurologic: Patient does not have protective sensation bilateral lower extremities.   MUSCULOSKELETAL:   Skin: Examination patient has bruising over the medial aspect of the left leg with significant pitting edema of the foot and leg.  There is no cellulitis no open draining wounds.  There is no tenderness to palpation.  Examination of the right transtibial amputation patient has a superficial serous blister secondary to swelling.  Patient is wearing a extra-large shrinker which is too loose.  Hemoglobin 9.0 white cell count 6.  Assessment: Assessment: Fluid overload with pitting edema both lower extremities with a serous blister on the right transtibial amputation.  Plan: Plan: I will order compression stocking for the left leg and a compression shrinker for the right below-knee  amputation.  I will follow-up in the office after discharge.  Thank you for the consult and the opportunity to see Mr. Keith Bernert, MD [facility] [phone] 7:23 AM

## 2023-07-05 NOTE — Assessment & Plan Note (Signed)
Oxygenating well on room air and has been since admission.  Lung exam today shows mild bibasilar crackles, possibly greater on the right side.  No significant orthopnea today and patient was up to the edge of the bed with OT.  We will continue with Lasix and daily weights to monitor his diuresis.  Echo yesterday showed EF of 40 to 45%.  RLE edema appears unchanged today patient may benefit from compression stocking. -80 mg Lasix - Continue Flomax -Continue bladder scans -Strict I's and O's and daily weights. -Compression stocking on right lower extremity

## 2023-07-05 NOTE — TOC Progression Note (Signed)
Spoke with patient re PT recommendation for SNF. Patient adamantly refusing SNF and plans to return home with continued HH ([facility]). Patient states he has a private caregiver to assist during the day and his dtr stays with him at night. MD updated. CM aware of HH needs.   Dellie Burns, MSW, LCSW [phone] (coverage)

## 2023-07-05 NOTE — Evaluation (Signed)
Occupational Therapy Evaluation Patient Details Name: Keith Espinoza MRN: [id number] DOB: 17-Oct-1940 Today's Date: 07/05/2023   History of Present Illness 82yo who presented on 07/03/23 with SOB, CXR concerning for R pleural effusion. Of note did have a L femur fracture and subsequent ORIF in August 2024. Admitted with suspected SOB in setting of acute CHF exacerbation. PMH CAD, MI, IDDM, gilbert syndrome, HTN, HLD, gout, R BKA, cholecystectomy, back surgery   Clinical Impression   PTA, pt lives alone in basement apartment and granddaughter renovating upstairs unit to move into in November. Pt has 8 hours of aide assist daily and family provide assist when aide unavailable. Since LLE fx, pt has been performing scoot transfers to w/c, BSC and bed and receiving assist for LB ADLs. Pt presents now fairly close to this reported baseline - able to sit EOB and scoot along bedside with Supervision. As long as continued consistent support available at home, anticipate no OT needs at DC.        If plan is discharge home, recommend the following: A little help with bathing/dressing/bathroom;Assistance with cooking/housework;Direct supervision/assist for financial management;Direct supervision/assist for medications management    Functional Status Assessment  Patient has not had a recent decline in their functional status  Equipment Recommendations  None recommended by OT    Recommendations for Other Services       Precautions / Restrictions Precautions Precautions: Fall;Other (comment) Precaution Comments: chronic R BKA (with blister), recent L LE femur fracture s/p ORIF now NWB Restrictions Weight Bearing Restrictions: Yes LLE Weight Bearing: Non weight bearing      Mobility Bed Mobility Overal bed mobility: Needs Assistance Bed Mobility: Supine to Sit, Sit to Supine     Supine to sit: Supervision, HOB elevated, Used rails Sit to supine: Supervision         Transfers Overall transfer level: Needs assistance                 General transfer comment: able to scoot along bedside without assistance; declined OOB to chair (reported preference for w/c)      Balance Overall balance assessment: Needs assistance Sitting-balance support: No upper extremity supported, Feet supported Sitting balance-Leahy Scale: Good                                     ADL either performed or assessed with clinical judgement   ADL Overall ADL's : Needs assistance/impaired Eating/Feeding: Independent   Grooming: Set up;Sitting   Upper Body Bathing: Set up;Sitting   Lower Body Bathing: Moderate assistance;Sitting/lateral leans;Bed level   Upper Body Dressing : Set up;Sitting   Lower Body Dressing: Moderate assistance;Sitting/lateral leans;Bed level   Toilet Transfer: Contact guard assist;Minimal assistance Toilet Transfer Details (indicate cue type and reason): simulated scooting along HOB (no drop arm BSC available Toileting- Clothing Manipulation and Hygiene: Moderate assistance;Sitting/lateral lean;Bed level         General ADL Comments: Close to reported baseline. discussed routine, having assist and fall prevention     Vision Baseline Vision/History: 1 Wears glasses Ability to See in Adequate Light: 0 Adequate Patient Visual Report: No change from baseline Vision Assessment?: No apparent visual deficits     Perception         Praxis         Pertinent Vitals/Pain Pain Assessment Pain Assessment: No/denies pain     Extremity/Trunk Assessment Upper Extremity Assessment Upper Extremity Assessment: Overall [facility]  for tasks assessed   Lower Extremity Assessment Lower Extremity Assessment: Defer to PT evaluation   Cervical / Trunk Assessment Cervical / Trunk Assessment: Kyphotic   Communication Communication Communication: No apparent difficulties   Cognition Arousal: Alert Behavior During Therapy: WFL for  tasks assessed/performed Overall Cognitive Status: Within Functional Limits for tasks assessed                                 General Comments: some agitated comments made towards OT "Dont you know anything?". able to redirect     General Comments  Teaching service entering during OT eval- OT had to exit room and come back to finish eval    Exercises     Shoulder Instructions      Home Living Family/patient expects to be discharged to:: Private residence Living Arrangements: Alone Available Help at Discharge: Family;Personal care attendant (22 hours/day) Type of Home: House Home Access: Level entry     Home Layout: Two level;Able to live on main level with bedroom/bathroom (lives in basement apartment)     Bathroom Shower/Tub: Walk-in shower   Bathroom Toilet: Standard Bathroom Accessibility: Yes   Home Equipment: Agricultural consultant (2 wheels);Cane - single point;BSC/3in1;Shower seat;Wheelchair - manual;Wheelchair - power;Grab bars - toilet;Grab bars - tub/shower (drop arm BSC)   Additional Comments: HH aide 8 hours a day. granddaughter renovating upstairs area to move into in November. Family available to assist when aide not present.      Prior Functioning/Environment Prior Level of Function : Needs assist             Mobility Comments: scoot transfers since LLE fx ADLs Comments: Aide assisting with clothing mgmt during toileting, LB dressing tasks and IADLs.        OT Problem List: Decreased activity tolerance;Decreased strength;Decreased knowledge of precautions      OT Treatment/Interventions: Self-care/ADL training;Therapeutic exercise;Energy conservation;DME and/or AE instruction;Therapeutic activities;Patient/family education    OT Goals(Current goals can be found in the care plan section) Acute Rehab OT Goals Patient Stated Goal: go home OT Goal Formulation: With patient Time For Goal Achievement: 07/19/23 Potential to Achieve Goals: Fair   OT Frequency: Min 1X/week    Co-evaluation              AM-PAC OT "6 Clicks" Daily Activity     Outcome Measure Help from another person eating meals?: None Help from another person taking care of personal grooming?: A Little Help from another person toileting, which includes using toliet, bedpan, or urinal?: A Little Help from another person bathing (including washing, rinsing, drying)?: A Little Help from another person to put on and taking off regular upper body clothing?: A Little Help from another person to put on and taking off regular lower body clothing?: A Little 6 Click Score: 19   End of Session Nurse Communication: Mobility status  Activity Tolerance: Patient tolerated treatment well Patient left: in bed;with bed alarm set;with call bell/phone within reach  OT Visit Diagnosis: Other abnormalities of gait and mobility (R26.89);Muscle weakness (generalized) (M62.81)                Time: 6045-4098 OT Time Calculation (min): 24 min Charges:  OT General Charges $OT Visit: 1 Visit OT Evaluation $OT Eval Low Complexity: 1 Low OT Treatments $Self Care/Home Management : 8-22 mins  Bradd Canary, OTR/L Acute Rehab Services Office: [phone]   Lorre Munroe 07/05/2023, 9:50 AM

## 2023-07-05 NOTE — Plan of Care (Signed)
  Problem: Education: Goal: Understanding of CV disease, CV risk reduction, and recovery process will improve Outcome: Progressing   Problem: Activity: Goal: Ability to return to baseline activity level will improve Outcome: Progressing   Problem: Nutritional: Goal: Maintenance of adequate nutrition will improve Outcome: Progressing   Problem: Health Behavior/Discharge Planning: Goal: Ability to safely manage health-related needs after discharge will improve Outcome: Not Progressing   Problem: Education: Goal: Ability to describe self-care measures that may prevent or decrease complications (Diabetes Survival Skills Education) will improve Outcome: Not Progressing   Problem: Coping: Goal: Ability to adjust to condition or change in health will improve Outcome: Not Progressing   Problem: Health Behavior/Discharge Planning: Goal: Ability to manage health-related needs will improve Outcome: Not Progressing   Problem: Metabolic: Goal: Ability to maintain appropriate glucose levels will improve Outcome: Not Progressing   Problem: Nutritional: Goal: Progress toward achieving an optimal weight will improve Outcome: Not Progressing   Problem: Tissue Perfusion: Goal: Adequacy of tissue perfusion will improve Outcome: Not Progressing

## 2023-07-05 NOTE — Assessment & Plan Note (Addendum)
Takes 12U Tresiba in AM, Jardiance 25mg  every day, Metformin 1000mg  BID, Pioglitazone 30mg  every day at home. CBGs inpatient today has been largely in the 200s and 300s with 1 spike up into the 400s.  We have increased his basal insulin and he received short acting insulin along with the reading in the 400s. - Semglee 10 units daily - Sensitive SSI - CBGs QID

## 2023-07-05 NOTE — Assessment & Plan Note (Signed)
Patient has a history of iron deficiency anemia but has also had recent hematemesis and dark stools as reported by both the patient and his daughter.  His typical hemoglobin level hovers around 10 but given the history of uncharacterized bleeding we will consult GI and begin iron replacement therapy. -IV iron sucrose 250 mg -Consult GI for further characterization of unknown source of bleeding, appreciate recommendations

## 2023-07-05 NOTE — Assessment & Plan Note (Deleted)
Patient's SOB likely secondary to acute CHF exacerbation. On exam, patient continues to have persistent 3+ pitting edema of L leg and bibasilar crackles on lung exam. Cxr with possible R pleural effusion. Patient received 40 mg Lasix yesterday evening and reports about half a liter of urine output, recorded at 300 mL. Given limited response to initial diuresis, will attempt again today.  - 80 mg Lasix today - Cont 0.4 mg Flomax - Bladder scan q4 to monitor for urinary retention  - Strict I/Os - Daily weights - F/u echocardiogram  - AM BMP, CBC

## 2023-07-05 NOTE — Assessment & Plan Note (Deleted)
hahahaha

## 2023-07-05 NOTE — Plan of Care (Signed)
  Problem: Coping: Goal: Ability to adjust to condition or change in health will improve Outcome: Progressing   Problem: Nutritional: Goal: Maintenance of adequate nutrition will improve Outcome: Progressing Goal: Progress toward achieving an optimal weight will improve Outcome: Progressing

## 2023-07-05 NOTE — Plan of Care (Signed)
  Problem: Education: Goal: Ability to describe self-care measures that may prevent or decrease complications (Diabetes Survival Skills Education) will improve Outcome: Progressing Goal: Individualized Educational Video(s) Outcome: Progressing   Problem: Coping: Goal: Ability to adjust to condition or change in health will improve Outcome: Progressing   Problem: Fluid Volume: Goal: Ability to maintain a balanced intake and output will improve Outcome: Progressing   Problem: Health Behavior/Discharge Planning: Goal: Ability to identify and utilize available resources and services will improve Outcome: Progressing Goal: Ability to manage health-related needs will improve Outcome: Progressing   Problem: Metabolic: Goal: Ability to maintain appropriate glucose levels will improve Outcome: Progressing   Problem: Nutritional: Goal: Maintenance of adequate nutrition will improve Outcome: Progressing Goal: Progress toward achieving an optimal weight will improve Outcome: Progressing   Problem: Skin Integrity: Goal: Risk for impaired skin integrity will decrease Outcome: Progressing   Problem: Tissue Perfusion: Goal: Adequacy of tissue perfusion will improve Outcome: Progressing   Problem: Education: Goal: Knowledge of General Education information will improve Description: Including pain rating scale, medication(s)/side effects and non-pharmacologic comfort measures Outcome: Progressing   Problem: Health Behavior/Discharge Planning: Goal: Ability to manage health-related needs will improve Outcome: Progressing   Problem: Clinical Measurements: Goal: Ability to maintain clinical measurements within normal limits will improve Outcome: Progressing Goal: Will remain free from infection Outcome: Progressing Goal: Diagnostic test results will improve Outcome: Progressing Goal: Respiratory complications will improve Outcome: Progressing Goal: Cardiovascular complication will  be avoided Outcome: Progressing   Problem: Activity: Goal: Risk for activity intolerance will decrease Outcome: Progressing   Problem: Nutrition: Goal: Adequate nutrition will be maintained Outcome: Progressing   Problem: Coping: Goal: Level of anxiety will decrease Outcome: Progressing   Problem: Elimination: Goal: Will not experience complications related to bowel motility Outcome: Progressing Goal: Will not experience complications related to urinary retention Outcome: Progressing   Problem: Pain Managment: Goal: General experience of comfort will improve Outcome: Progressing   Problem: Safety: Goal: Ability to remain free from injury will improve Outcome: Progressing   Problem: Skin Integrity: Goal: Risk for impaired skin integrity will decrease Outcome: Progressing   Problem: Education: Goal: Ability to demonstrate management of disease process will improve Outcome: Progressing Goal: Ability to verbalize understanding of medication therapies will improve Outcome: Progressing Goal: Individualized Educational Video(s) Outcome: Progressing   Problem: Activity: Goal: Capacity to carry out activities will improve Outcome: Progressing   Problem: Cardiac: Goal: Ability to achieve and maintain adequate cardiopulmonary perfusion will improve Outcome: Progressing   

## 2023-07-05 NOTE — Assessment & Plan Note (Deleted)
[email or web address]

## 2023-07-05 NOTE — Inpatient Diabetes Management (Signed)
Inpatient Diabetes Program Recommendations  AACE/ADA: New Consensus Statement on Inpatient Glycemic Control   Target Ranges:  Prepandial:   less than 140 mg/dL      Peak postprandial:   less than 180 mg/dL (1-2 hours)      Critically ill patients:  140 - 180 mg/dL    Latest Reference Range & Units 07/04/23 07:38 07/04/23 12:47 07/04/23 18:33 07/04/23 21:41 07/05/23 06:05  Glucose-Capillary 70 - 99 mg/dL 295 (H) 284 (H) 132 (H) 317 (H) 218 (H)   Review of Glycemic Control  Diabetes history: DM2 Outpatient Diabetes medications: Tresiba 12 units daily, Novolog 2-5 units daily per correction scale, Jardiance 25 mg daily, Metformin 1000 mg BID, Actos 30 mg daily, Dexcom G7 Current orders for Inpatient glycemic control: Semglee 5 units daily, Novolog 0-9 units TID with meals  Inpatient Diabetes Program Recommendations:    Insulin: Please consider increasing Semglee to 9 units daily and ordering Novolog 0-5 units at bedtime and Novolog 4 units TID with meals for meal coverage if patient eats at least 50% of meals.  Outpatient DM medications: Please discontinue Actos as outpatient due to side effect of fluid retention.  Thanks, Orlando Penner, RN, MSN, CDCES Diabetes Coordinator Inpatient Diabetes Program [phone] (Team Pager from 8am to 5pm)

## 2023-07-05 NOTE — Progress Notes (Signed)
Daily Progress Note Intern Pager: [phone]  Patient name: Keith Espinoza Medical record number: [id number] Date of birth: 03-Feb-1941 Age: 82 y.o. Gender: male  Primary Care Provider: Emilio Aspen, MD Consultants: Orthopedics, gastroenterology Code Status: DNR/DNI  Pt Overview and Major Events to Date:  9/29: Admitted  Assessment and Plan:  Keith Espinoza is an 82 year old male presenting with shortness of breath secondary to acute CHF exacerbation now with worsening anemia.  He has a history of anemia, hypertension, type 2 diabetes and coronary artery disease.  Assessment & Plan CHF exacerbation (HCC) Oxygenating well on room air and has been since admission.  Lung exam today shows mild bibasilar crackles, possibly greater on the right side.  No significant orthopnea today and patient was up to the edge of the bed with OT.  We will continue with Lasix and daily weights to monitor his diuresis.  Echo yesterday showed EF of 40 to 45%.  RLE edema appears unchanged today patient may benefit from compression stocking. -80 mg Lasix - Continue Flomax -Continue bladder scans -Strict I's and O's and daily weights. -Compression stocking on right lower extremity Type 2 diabetes mellitus with diabetic polyneuropathy, with long-term current use of insulin (HCC) Takes 12U Tresiba in AM, Jardiance 25mg  every day, Metformin 1000mg  BID, Pioglitazone 30mg  every day at home. CBGs inpatient today has been largely in the 200s and 300s with 1 spike up into the 400s.  We have increased his basal insulin and he received short acting insulin along with the reading in the 400s. - Semglee 10 units daily - Sensitive SSI - CBGs QID Chronic iron deficiency anemia Patient has a history of iron deficiency anemia but has also had recent hematemesis and dark stools as reported by both the patient and his daughter.  His typical hemoglobin level hovers around 10 but given the history of  uncharacterized bleeding we will consult GI and begin iron replacement therapy. -IV iron sucrose 250 mg -Consult GI for further characterization of unknown source of bleeding, appreciate recommendations   Chronic and Stable Problems:  HLD: Continue Crestor GERD: Continue Protonix BPH: Continue Flomax, hold home terazosin CAD: Continue home aspirin, continue holding home Plavix until after GI consult   FEN/GI: Carb modified diet PPx: Holding due to continued concern for bleed Dispo:Home pending clinical improvement .   Subjective:  Patient is feeling better today and denies any issues with breathing or shortness of breath.  He is concerned about the bloody vomit that he had previously and would like to have that looked into further even if it means he needs to stay a few extra days.  He is in good spirits and pointed out that today was his birthday.  Objective: Temp:  [97.9 F (36.6 C)-98.2 F (36.8 C)] 98.2 F (36.8 C) (10/01 0305) Pulse Rate:  [59-76] 68 (10/01 0500) Resp:  [11-19] 17 (10/01 0500) BP: (81-124)/(54-87) 111/68 (10/01 0305) SpO2:  [87 %-100 %] 87 % (10/01 0500) Weight:  [80.5 kg] 80.5 kg (10/01 0500) Physical Exam: General: Alert, oriented no obvious distress.  Resting comfortably in bed Cardiovascular: RRR, no M/R/G Respiratory: Fine bibasilar crackles appreciated on exam slightly greater on the right than on the left, good air movement, no increased work of breathing. Abdomen: Flat, soft, nontender. Extremities: BKA of left limb, no appreciable edema large blister noted in previous progress notes still present but improving.  Right lower extremity with 3+ pitting edema to below the knee, nontender, pedal pulse 2+.  Laboratory:  Most recent CBC Lab Results  Component Value Date   WBC 6.0 07/05/2023   HGB 9.0 (L) 07/05/2023   HCT 28.8 (L) 07/05/2023   MCV 88.6 07/05/2023   PLT 104 (L) 07/05/2023   Most recent BMP    Latest Ref Rng & Units 07/05/2023     3:02 AM  BMP  Glucose 70 - 99 mg/dL 161   BUN 8 - 23 mg/dL 31   Creatinine 0.96 - 1.24 mg/dL 0.45   Sodium 409 - 811 mmol/L 133   Potassium 3.5 - 5.1 mmol/L 3.6   Chloride 98 - 111 mmol/L 97   CO2 22 - 32 mmol/L 30   Calcium 8.9 - 10.3 mg/dL 8.4    Gerrit Heck, DO 07/05/2023, 8:09 AM  PGY-1, [facility] FPTS Intern pager: [phone], text pages welcome Secure chat group [facility] Teaching Service

## 2023-07-06 ENCOUNTER — Ambulatory Visit: Payer: Medicare Other | Admitting: Orthopedic Surgery

## 2023-07-06 DIAGNOSIS — R195 Other fecal abnormalities: Secondary | ICD-10-CM | POA: Diagnosis not present

## 2023-07-06 DIAGNOSIS — D649 Anemia, unspecified: Secondary | ICD-10-CM | POA: Diagnosis not present

## 2023-07-06 DIAGNOSIS — I5189 Other ill-defined heart diseases: Secondary | ICD-10-CM

## 2023-07-06 DIAGNOSIS — T148XXA Other injury of unspecified body region, initial encounter: Secondary | ICD-10-CM

## 2023-07-06 DIAGNOSIS — L8990 Pressure ulcer of unspecified site, unspecified stage: Secondary | ICD-10-CM | POA: Insufficient documentation

## 2023-07-06 LAB — CBC
HCT: 30.4 % — ABNORMAL LOW (ref 39.0–52.0)
Hemoglobin: 9.6 g/dL — ABNORMAL LOW (ref 13.0–17.0)
MCH: 27.6 pg (ref 26.0–34.0)
MCHC: 31.6 g/dL (ref 30.0–36.0)
MCV: 87.4 fL (ref 80.0–100.0)
Platelets: 126 10*3/uL — ABNORMAL LOW (ref 150–400)
RBC: 3.48 MIL/uL — ABNORMAL LOW (ref 4.22–5.81)
RDW: 19.9 % — ABNORMAL HIGH (ref 11.5–15.5)
WBC: 7.1 10*3/uL (ref 4.0–10.5)
nRBC: 0 % (ref 0.0–0.2)

## 2023-07-06 LAB — BASIC METABOLIC PANEL
Anion gap: 13 (ref 5–15)
BUN: 29 mg/dL — ABNORMAL HIGH (ref 8–23)
CO2: 32 mmol/L (ref 22–32)
Calcium: 8.6 mg/dL — ABNORMAL LOW (ref 8.9–10.3)
Chloride: 94 mmol/L — ABNORMAL LOW (ref 98–111)
Creatinine, Ser: 1.49 mg/dL — ABNORMAL HIGH (ref 0.61–1.24)
GFR, Estimated: 46 mL/min — ABNORMAL LOW (ref >60)
Glucose, Bld: 118 mg/dL — ABNORMAL HIGH (ref 70–99)
Potassium: 3 mmol/L — ABNORMAL LOW (ref 3.5–5.1)
Sodium: 139 mmol/L (ref 135–145)

## 2023-07-06 LAB — GLUCOSE, CAPILLARY
Glucose-Capillary: 152 mg/dL — ABNORMAL HIGH (ref 70–99)
Glucose-Capillary: 357 mg/dL — ABNORMAL HIGH (ref 70–99)
Glucose-Capillary: 219 mg/dL — ABNORMAL HIGH (ref 70–99)
Glucose-Capillary: 290 mg/dL — ABNORMAL HIGH (ref 70–99)

## 2023-07-06 LAB — CULTURE, BLOOD (ROUTINE X 2): Special Requests: ADEQUATE

## 2023-07-06 LAB — MAGNESIUM: Magnesium: 1.8 mg/dL (ref 1.7–2.4)

## 2023-07-06 MED ORDER — BISACODYL 5 MG PO TBEC
10.0000 mg | DELAYED_RELEASE_TABLET | Freq: Once | ORAL | Status: AC
  Administered 2023-07-06: 10 mg via ORAL
  Filled 2023-07-06: qty 2

## 2023-07-06 MED ORDER — MAGNESIUM SULFATE 2 GM/50ML IV SOLN
2.0000 g | Freq: Once | INTRAVENOUS | Status: AC
  Administered 2023-07-06: 2 g via INTRAVENOUS
  Filled 2023-07-06: qty 50

## 2023-07-06 MED ORDER — POTASSIUM CHLORIDE CRYS ER 20 MEQ PO TBCR
40.0000 meq | EXTENDED_RELEASE_TABLET | Freq: Two times a day (BID) | ORAL | Status: AC
  Administered 2023-07-06 (×2): 40 meq via ORAL
  Filled 2023-07-06 (×2): qty 2

## 2023-07-06 MED ORDER — PANTOPRAZOLE SODIUM 40 MG PO TBEC
40.0000 mg | DELAYED_RELEASE_TABLET | Freq: Two times a day (BID) | ORAL | Status: DC
  Administered 2023-07-06 – 2023-07-10 (×7): 40 mg via ORAL
  Filled 2023-07-06 (×8): qty 1

## 2023-07-06 MED ORDER — FUROSEMIDE 40 MG PO TABS
80.0000 mg | ORAL_TABLET | Freq: Every day | ORAL | Status: DC
  Administered 2023-07-06 – 2023-07-07 (×2): 80 mg via ORAL
  Filled 2023-07-06 (×2): qty 2

## 2023-07-06 NOTE — Assessment & Plan Note (Signed)
Anemia appears to have stabilized for the time being, we will await GIs recommendations as far as workup moving forward. -GI consult in place, appreciate recs

## 2023-07-06 NOTE — Care Management Important Message (Signed)
Important Message  Patient Details  Name: Keith Espinoza MRN: [id number] Date of Birth: 02-Nov-1940   Important Message Given:  Yes - Medicare IM     Dorena Bodo 07/06/2023, 1:44 PM

## 2023-07-06 NOTE — Consult Note (Addendum)
[facility] CM Inpatient Consult   07/06/2023  Keith Espinoza 1941/03/19 [id number]  Covering Keith Espinoza, CM [facility] Liaison at [facility])  Primary Care Provider:   Dr. Orson Aloe [facility])  Patient is currently active with Care Management for chronic disease management services.  Patient has been engaged by a Financial planner.  Our community based plan of care has focused on disease management and community resource support.   Patient will receive a post hospital call and will be evaluated for assessments and disease process education. Collaborated with the active RNCM and [facility] team concerning pt's disposition.  Plan: When medically table will discharge with [facility] ([facility]).  Inpatient Transition Of Care [TOC] team member to make aware that Care Management following.  Of note, Care Management services does not replace or interfere with any services that are needed or arranged by inpatient [facility] care management team.   For additional questions or referrals please contact:  Keith Cousin, RN, [facility] Liaison [place]   Population Health Office Hours MTWF  8:00 am-6:00 pm [phone] mobile [phone] [Office toll free line] Office Hours are M-F 8:30 - 5 pm [email or web address]

## 2023-07-06 NOTE — Assessment & Plan Note (Addendum)
Oxygenating well on room air and has been since admission.  Lung exam today is improved with only minimal crackling in the left lung base and mild crackling in the right lung base.  He has some dyspnea with exertion witnessed as he was sitting up for the lung exam today but he recovers quickly.  Lower extremity edema is mildly reduced today, and we will try to get compression stockings today as well.   - continue with 80 mg Lasix - Continue Flomax - Strict I's and O's and daily weights. - Place compression stocking on left lower extremity

## 2023-07-06 NOTE — Assessment & Plan Note (Signed)
Takes 12U Tresiba in AM, Jardiance 25mg  every day, Metformin 1000mg  BID, Pioglitazone 30mg  every day at home.  Patient has had appropriate response to increase in long-acting insulin yesterday with more appropriate blood sugars in the 100s and 200s.  We will continue with current regimen and continue to monitor - Semglee 10 units daily - Sensitive SSI - CBGs QID

## 2023-07-06 NOTE — Progress Notes (Signed)
PT Cancellation Note  Patient Details Name: Keith Espinoza MRN: [id number] DOB: Jun 08, 1941   Cancelled Treatment:    Reason Eval/Treat Not Completed: (P) Patient declined, no reason specified (Pt on bedpan and requesting more time. Will follow up as schedule allows.)   [name] 07/06/2023, 2:12 PM

## 2023-07-06 NOTE — Consult Note (Signed)
Consultation Note   Referring Provider:  Family Medicine Teaching Service.  PCP: Emilio Aspen, MD Primary Gastroenterologist: Gentry Fitz        Reason for Consultation: Worsening anemia  DOA: 07/03/2023         Hospital Day: 4   ASSESSMENT    Brief Narrative:  82 y.o. year old male with HTN, DM2, CAD on DAPT, chronic anemia.  Admitted 9/29 with volume overload.   Dark stools / reported episode of hematemesis ( one month ago) on asa + plavix. Suspect upper GI bleed. Rule out PUD, erosive disease. AVMs?  Right colon lesion not excluded but wouldn't explain the episode of hematemesis last month No BMs since admission per patient but on rectal exam there is a small amount of very dark stool present. Plavix on hold, last dose was most likely 9/28 or 9/29.   Acute on chronic anemia  likely 2/2 to GI bleed. Probably iron deficient based on iron studies Baseline hgb ~ 9.5 , down to 7.2 on admission.  Hgb 9.6 after 1 U or PRBC.   CHF exacerbation, resolving.    Chronic thrombocytopenia.  No evidence for cirrhosis at this point.  INR normal in late July. Currently albumin is low but could be low as acute phase reactant or ? Nutritional. No radiologic findings of cirrhosis or splenomegaly on last CT scan in 2022     PLAN:   --Need at least an EGD and ideally a colonoscopy after Plavix washout. The risks and benefits of EGD and colonoscopy with possible polypectomy / biopsies were discussed and the patient is agreeable to either / or both procedures.   --Increasing PPI to BID --Trend H/H, transfuse as needed --He has already received a dose of IV iron this admission  HPI   Patient admitted with CHF exacerbation. He has chronic anemia and found to have declining hgb. Daughter had reported dark stools at home and patient tells me he had an episode of dark emesis about a month ago. He has no abdominal pain. No further N/V. No diarrhea nor  constipation. He apparently doesn't take iron at home. Denies chronic GI symptoms but has Protonix on home med list. He has no prior history of colon cancer screening. No prior history of GI bleeding.   Previous GI Evaluations   None  Labs and Imaging: Recent Labs    07/04/23 0609 07/05/23 0302 07/05/23 1454 07/06/23 0216  WBC 5.6 6.0  --  7.1  HGB 10.1* 9.0* 9.8* 9.6*  HCT 33.4* 28.8* 30.9* 30.4*  PLT 114* 104*  --  126*   Recent Labs    07/04/23 0609 07/05/23 0302 07/06/23 0216  NA 134* 133* 139  K 4.0 3.6 3.0*  CL 97* 97* 94*  CO2 29 30 32  GLUCOSE 219* 205* 118*  BUN 27* 31* 29*  CREATININE 1.56* 1.56* 1.49*  CALCIUM 8.8* 8.4* 8.6*   Recent Labs    07/04/23 0609  PROT 5.9*  ALBUMIN 2.6*  AST 15  ALT 14  ALKPHOS 75  BILITOT 0.8   No results for input(s): "HEPBSAG", "HCVAB", "HEPAIGM", "HEPBIGM" in the last 72 hours. No results for input(s): "LABPROT", "INR" in the last 72 hours.    Past Medical History:  Diagnosis Date   Allergic rhinitis    Allergic rhinitis    Arthritis    Basal cell carcinoma 11/01/2019    bcc left chest treatment TX cx3 65fu    Chronic leg pain    right   Chronic lower back pain    Coronary artery disease    a. Stenting to RCA 2004; staged DES to LAD and Cx 2004. DES to [facility] 2012. b. DES to mCx, PTCA to dCx 11/2011. c. Lateral wall MI 2013 s/p PTCA to distal Cx & DES to mid OM2 11/2011. d. Low risk nuc 04/2014, EF wnl.   COVID-19    Diabetes mellitus    Insulin dependent   Diabetic neuropathy (HCC)    MILD   Diverticulosis    Dysrhythmia    [name] syndrome    Gout    right wrist; right foot; right elbow; have had it since 1970's   H/O hiatal hernia    Heart murmur    History of echocardiogram    aortic sclerosis per echo 12/09 EF 65%, otherwise normal   History of hemorrhoids    BLEEDING   History of kidney stones    h/o   Hypertension    Diagnosed 1995    Myocardial infarction [facility])    Pancreatic pseudocyst    a.  s/p remote drainage 2006.   Thrombocytopenia (HCC)    Seen on oldest labs in system from 2004   Vitamin B 12 deficiency    orally replaced    Past Surgical History:  Procedure Laterality Date   ABDOMINAL AORTOGRAM W/LOWER EXTREMITY Bilateral 08/08/2020   Procedure: ABDOMINAL AORTOGRAM W/LOWER EXTREMITY;  Surgeon: Chuck Hint, MD;  Location: [facility] INVASIVE CV LAB;  Service: Cardiovascular;  Laterality: Bilateral;   AMPUTATION Right 12/12/2020   Procedure: RIGHT BELOW KNEE AMPUTATION;  Surgeon: Nadara Mustard, MD;  Location: [facility] OR;  Service: Orthopedics;  Laterality: Right;   BACK SURGERY     "total of 3 times" S/P fall    CARPAL TUNNEL RELEASE Bilateral    CHOLECYSTECTOMY  1990's   COLONOSCOPY     CORONARY ANGIOPLASTY  11/11/11   CORONARY ANGIOPLASTY WITH STENT PLACEMENT  09/30/2011   "1 then; makes a total of 4"   CORONARY ANGIOPLASTY WITH STENT PLACEMENT  11/11/11   "1; makes a total of 5"   INGUINAL HERNIA REPAIR  2003   right   JOINT REPLACEMENT Right 04/03/2002   hip replacment   KNEE ARTHROSCOPY  1990's   left   LEFT HEART CATH AND CORONARY ANGIOGRAPHY N/A 04/25/2023   Procedure: LEFT HEART CATH AND CORONARY ANGIOGRAPHY;  Surgeon: Marykay Lex, MD;  Location: [facility] INVASIVE CV LAB;  Service: Cardiovascular;  Laterality: N/A;   LEFT HEART CATHETERIZATION WITH CORONARY ANGIOGRAM N/A 09/30/2011   Procedure: LEFT HEART CATHETERIZATION WITH CORONARY ANGIOGRAM;  Surgeon: Corky Crafts, MD;  Location: [facility] CATH LAB;  Service: Cardiovascular;  Laterality: N/A;  possible PCI   LEFT HEART CATHETERIZATION WITH CORONARY ANGIOGRAM N/A 11/15/2011   Procedure: LEFT HEART CATHETERIZATION WITH CORONARY ANGIOGRAM;  Surgeon: Corky Crafts, MD;  Location: [facility] CATH LAB;  Service: Cardiovascular;  Laterality: N/A;   PERCUTANEOUS CORONARY STENT INTERVENTION (PCI-S)  09/30/2011   Procedure: PERCUTANEOUS CORONARY STENT INTERVENTION (PCI-S);  Surgeon: Corky Crafts, MD;  Location: [facility]  CATH LAB;  Service: Cardiovascular;;   PERCUTANEOUS CORONARY STENT INTERVENTION (PCI-S) N/A 11/11/2011   Procedure: PERCUTANEOUS CORONARY STENT INTERVENTION (PCI-S);  Surgeon: Corky Crafts, MD;  Location: [facility]  CATH LAB;  Service: Cardiovascular;  Laterality: N/A;   PERIPHERAL VASCULAR BALLOON ANGIOPLASTY Right 08/08/2020   Procedure: PERIPHERAL VASCULAR BALLOON ANGIOPLASTY;  Surgeon: Chuck Hint, MD;  Location: [facility] INVASIVE CV LAB;  Service: Cardiovascular;  Laterality: Right;  Posterior tibial    SHOULDER SURGERY Right    X 2   STUMP REVISION Right 01/09/2021   Procedure: REVISION RIGHT BELOW KNEE AMPUTATION;  Surgeon: Nadara Mustard, MD;  Location: [facility] OR;  Service: Orthopedics;  Laterality: Right;   TONSILLECTOMY  ~ 1948   TOTAL HIP REVISION Right 04/13/2019   Procedure: RIGHT TOTAL HIP REVISION-POSTERIOR  APPROACH LATERAL;  Surgeon: Eldred Manges, MD;  Location: [facility] OR;  Service: Orthopedics;  Laterality: Right;   TOTAL KNEE ARTHROPLASTY Left 07/23/2016   Procedure: LEFT TOTAL KNEE ARTHROPLASTY;  Surgeon: Eldred Manges, MD;  Location: [facility] OR;  Service: Orthopedics;  Laterality: Left;    Family History  Problem Relation Age of Onset   Diabetes Mother    Hyperlipidemia Mother    Hypertension Mother    Cancer Father    Hypertension Father    Diabetes Sister    Hypertension Sister    Cancer Brother    Heart attack Neg Hx     Prior to Admission medications   Medication Sig Start Date End Date Taking? Authorizing Provider  acetaminophen (TYLENOL) 500 MG tablet Take 1,000 mg by mouth every 6 (six) hours as needed (pain).   Yes [provider]  allopurinol (ZYLOPRIM) 300 MG tablet Take 300 mg by mouth daily.   Yes [provider]  amLODipine (NORVASC) 10 MG tablet Take 1 tablet (10 mg total) by mouth daily. 04/28/23  Yes Azucena Fallen, MD  Ascorbic Acid (VITAMIN C PO) Take 1 tablet by mouth at bedtime.   Yes [provider]  aspirin EC 81 MG  tablet Take 81 mg by mouth daily.   Yes [provider]  Cholecalciferol (VITAMIN D3) 50 MCG (2000 UT) TABS Take 2,000 Units by mouth at bedtime.   Yes [provider]  clopidogrel (PLAVIX) 75 MG tablet Take 1 tablet (75 mg total) by mouth daily. 12/26/20  Yes Love, Evlyn Kanner, PA-C  clotrimazole-betamethasone (LOTRISONE) cream Apply topically 2 (two) times daily. 06/16/23 06/15/24 Yes [provider]  Cyanocobalamin (B-12) 2500 MCG TABS Take 2,500 mcg by mouth daily.   Yes [provider]  empagliflozin (JARDIANCE) 25 MG TABS tablet TAKE 1 TABLET(25 MG) BY MOUTH DAILY 06/01/23  Yes [name], Konrad Dolores, MD  furosemide (LASIX) 20 MG tablet Take 1 tablet (20 mg total) by mouth daily for 10 days. 06/22/23 07/03/23 Yes Countryman, Almeta Monas, MD  gabapentin (NEURONTIN) 300 MG capsule Take 600 mg by mouth at bedtime.   Yes [provider]  gabapentin (NEURONTIN) 400 MG capsule Take 1 capsule (400 mg total) by mouth 3 (three) times daily. Patient taking differently: Take 400 mg by mouth daily. 12/26/20  Yes Love, Evlyn Kanner, PA-C  insulin aspart (NOVOLOG FLEXPEN) 100 UNIT/ML FlexPen Max daily 30 units Patient taking differently: Inject 2-5 Units into the skin daily. Per sliding scale 06/23/22  Yes [name], Konrad Dolores, MD  insulin degludec (TRESIBA FLEXTOUCH) 100 UNIT/ML FlexTouch Pen Inject 12 Units into the skin daily. 11/11/22  Yes [name], Konrad Dolores, MD  isosorbide mononitrate (IMDUR) 60 MG 24 hr tablet Take 1 tablet (60 mg total) by mouth daily. 04/28/23  Yes Azucena Fallen, MD  losartan (COZAAR) 25 MG tablet Take 1 tablet (25 mg total)  by mouth daily. 04/28/23  Yes Azucena Fallen, MD  MAGNESIUM PO Take 1 tablet by mouth at bedtime.   Yes [provider]  metFORMIN (GLUCOPHAGE) 1000 MG tablet Take 1 tablet (1,000 mg total) by mouth 2 (two) times daily. 02/12/22  Yes [name], Konrad Dolores, MD  metoprolol tartrate (LOPRESSOR) 25  MG tablet Take 25 mg by mouth daily.   Yes [provider]  nitroGLYCERIN (NITROSTAT) 0.4 MG SL tablet Place 0.4 mg under the tongue every 5 (five) minutes x 3 doses as needed for chest pain.    Yes [provider]  Omega-3 Fatty Acids (FISH OIL) 1200 MG CAPS Take by mouth.   Yes [provider]  pantoprazole (PROTONIX) 40 MG tablet Take 40 mg by mouth daily. 06/16/23  Yes [provider]  pioglitazone (ACTOS) 30 MG tablet TAKE 1 TABLET(30 MG) BY MOUTH DAILY 04/06/23  Yes [name], Konrad Dolores, MD  Potassium Chloride ER 20 MEQ TBCR Take 1 tablet by mouth daily. 06/02/23  Yes [provider]  rosuvastatin (CRESTOR) 20 MG tablet Take 1 tablet (20 mg total) by mouth daily. 04/28/23  Yes Azucena Fallen, MD  senna-docusate (SENOKOT-S) 8.6-50 MG tablet Take 1 tablet by mouth at bedtime. Patient taking differently: Take 1 tablet by mouth 2 (two) times daily. 02/03/21  Yes Vassie Loll, MD  terazosin (HYTRIN) 5 MG capsule Take 1 capsule (5 mg total) by mouth at bedtime. Patient taking differently: Take 5 mg by mouth 2 (two) times daily. 12/26/20  Yes Love, Evlyn Kanner, PA-C  traZODone (DESYREL) 50 MG tablet Take 50 mg by mouth at bedtime. 06/02/23  Yes [provider]  Turmeric (QC TUMERIC COMPLEX PO) Take 1 capsule by mouth at bedtime.   Yes [provider]  amoxicillin-clavulanate (AUGMENTIN) 875-125 MG tablet Take 1 tablet by mouth every 12 (twelve) hours. Patient not taking: Reported on 07/03/2023 06/22/23   Glyn Ade, MD  Continuous Blood Gluc Sensor (DEXCOM G7 SENSOR) MISC 1 Device by Does not apply route as directed. 09/20/22   [name], Konrad Dolores, MD  Insulin Pen Needle 32G X 4 MM MISC 1 Device by Does not apply route in the morning, at noon, in the evening, and at bedtime. 06/23/22   [name], Konrad Dolores, MD    Current Facility-Administered Medications  Medication Dose Route Frequency Provider Last Rate Last  Admin   aspirin EC tablet 81 mg  81 mg Oral Daily Everhart, Kirstie, DO   81 mg at 07/06/23 6578   gabapentin (NEURONTIN) capsule 400 mg  400 mg Oral Daily Alfredo Martinez, MD   400 mg at 07/06/23 4696   gabapentin (NEURONTIN) capsule 600 mg  600 mg Oral QHS Maxwell, Allee, MD   600 mg at 07/05/23 2230   influenza vaccine adjuvanted (FLUAD) injection 0.5 mL  0.5 mL Intramuscular Tomorrow-1000 Chambliss, Marshall L, MD       insulin aspart (novoLOG) injection 0-9 Units  0-9 Units Subcutaneous TID WC Alfredo Martinez, MD   2 Units at 07/06/23 0620   insulin glargine-yfgn (SEMGLEE) injection 10 Units  10 Units Subcutaneous Daily Evette Georges, MD   10 Units at 07/06/23 0923   pantoprazole (PROTONIX) EC tablet 40 mg  40 mg Oral Daily Margaretmary Dys, MD   40 mg at 07/06/23 2952   pneumococcal 20-valent conjugate vaccine (PREVNAR 20) injection 0.5 mL  0.5 mL Intramuscular Tomorrow-1000 Chambliss, Estill Batten, MD       potassium chloride SA (KLOR-CON M) CR tablet  40 mEq  40 mEq Oral BID Shelby Mattocks, DO   40 mEq at 07/06/23 4098   rosuvastatin (CRESTOR) tablet 20 mg  20 mg Oral Daily Alfredo Martinez, MD   20 mg at 07/06/23 1191   tamsulosin (FLOMAX) capsule 0.4 mg  0.4 mg Oral QPC supper Alfredo Martinez, MD   0.4 mg at 07/05/23 1708    Allergies as of 07/03/2023 - Review Complete 07/03/2023  Allergen Reaction Noted   Simvastatin Other (See Comments) 10/01/2013   Zetia [ezetimibe] Other (See Comments) 10/01/2013   Dilaudid [hydromorphone hcl] Other (See Comments) 08/02/2016    Social History   Socioeconomic History   Marital status: Married    Spouse name: Not on file   Number of children: Not on file   Years of education: Not on file   Highest education level: Not on file  Occupational History   Occupation: Retired  Tobacco Use   Smoking status: Former   Smokeless tobacco: Current    Types: Chew   Tobacco comments:    quit 60 years ago  Vaping Use   Vaping status: Never  Used  Substance and Sexual Activity   Alcohol use: No   Drug use: No   Sexual activity: Not Currently  Other Topics Concern   Not on file  Social History Narrative   Not on file   Social Determinants of Health   Financial Resource Strain: Low Risk  (05/16/2023)   Received from [facility]   Overall Financial Resource Strain (CARDIA)    Difficulty of Paying Living Expenses: Not very hard  Food Insecurity: No Food Insecurity (07/04/2023)   Hunger Vital Sign    Worried About Running Out of Food in the Last Year: Never true    Ran Out of Food in the Last Year: Never true  Transportation Needs: No Transportation Needs (07/05/2023)   PRAPARE - Administrator, Civil Service (Medical): No    Lack of Transportation (Non-Medical): No  Physical Activity: Not on file  Stress: No Stress Concern Present (06/12/2023)   Received from [facility] - Occupational Stress Questionnaire    Feeling of Stress : Not at all  Social Connections: Unknown (05/12/2023)   Received from [facility]   Social Network    Social Network: Not on file  Intimate Partner Violence: Not At Risk (07/04/2023)   Humiliation, Afraid, Rape, and Kick questionnaire    Fear of Current or Ex-Partner: No    Emotionally Abused: No    Physically Abused: No    Sexually Abused: No     Code Status   Code Status: Limited: Do not attempt resuscitation (DNR) -DNR-LIMITED -Do Not Intubate/DNI   Review of Systems: All systems reviewed and negative except where noted in HPI.  Physical Exam: Vital signs in last 24 hours: Temp:  [98 F (36.7 C)-98.7 F (37.1 C)] 98.7 F (37.1 C) (10/02 0742) Pulse Rate:  [82-92] 88 (10/02 0742) Resp:  [15-20] 19 (10/02 0742) BP: (108-119)/(60-73) 110/62 (10/02 0742) SpO2:  [90 %-94 %] 91 % (10/02 0742) Weight:  [77.5 kg] 77.5 kg (10/02 0500) Last BM Date : 08/02/23  General:  Pleasant male in NAD Psych:  Cooperative. Normal mood and  affect Eyes: Pupils equal Ears:  Normal auditory acuity Nose: No deformity, discharge or lesions Neck:  Supple, no masses felt Lungs:  Clear to auscultation.  Heart:  Regular rate, irregular rhythm.  Abdomen:  Soft, nondistended, nontender, active bowel sounds,  no masses felt Rectal :  Deferred Msk: Symmetrical without gross deformities.  Neurologic:  Alert, oriented, grossly normal neurologically Extremities : No edema or LLE, R BKA Skin:  Intact without significant lesions.    Intake/Output from previous day: 10/01 0701 - 10/02 0700 In: 1582.8 [P.O.:1320; IV Piggyback:262.8] Out: 5700 [Urine:5700] Intake/Output this shift:  Total I/O In: 240 [P.O.:240] Out: 200 [Urine:200]  Principal Problem:   CHF exacerbation (HCC) Active Problems:   Type 2 diabetes mellitus with diabetic polyneuropathy, with long-term current use of insulin (HCC)   Shortness of breath   Edema of both lower extremities   Chronic iron deficiency anemia    Willette Cluster, NP-C   07/06/2023, 9:40 AM

## 2023-07-06 NOTE — Progress Notes (Signed)
Md made aware of short runs of a-fib and magnesium level of 1.8. no order given.

## 2023-07-06 NOTE — Inpatient Diabetes Management (Signed)
Inpatient Diabetes Program Recommendations  AACE/ADA: New Consensus Statement on Inpatient Glycemic Control  Target Ranges:  Prepandial:   less than 140 mg/dL      Peak postprandial:   less than 180 mg/dL (1-2 hours)      Critically ill patients:  140 - 180 mg/dL    Latest Reference Range & Units 07/05/23 06:05 07/05/23 13:12 07/05/23 15:45 07/05/23 16:44 07/05/23 17:27 07/05/23 17:51 07/05/23 21:01 07/06/23 06:09  Glucose-Capillary 70 - 99 mg/dL 027 (H) 253 (H) 664 (H) 422 (H) 414 (H) 373 (H) 189 (H) 152 (H)   Review of Glycemic Control  Diabetes history: DM2 Outpatient Diabetes medications: Tresiba 12 units daily, Novolog 2-5 units daily per correction scale, Jardiance 25 mg daily, Metformin 1000 mg BID, Actos 30 mg daily, Dexcom G7 Current orders for Inpatient glycemic control: Semglee 10 units daily, Novolog 0-9 units TID with meals   Inpatient Diabetes Program Recommendations:     Insulin: Please consider ordering Novolog 0-5 units at bedtime and Novolog 4 units TID with meals for meal coverage if patient eats at least 50% of meals.   Outpatient DM medications: Please discontinue Actos as outpatient due to side effect of fluid retention.   Thanks, Orlando Penner, RN, MSN, CDCES Diabetes Coordinator Inpatient Diabetes Program [phone] (Team Pager from 8am to 5pm)

## 2023-07-06 NOTE — Progress Notes (Addendum)
Daily Progress Note Intern Pager: [phone]  Patient name: Keith Espinoza Medical record number: [id number] Date of birth: Apr 12, 1941 Age: 82 y.o. Gender: male  Primary Care Provider: Emilio Aspen, MD Consultants: orthopedics, gastroenterology Code Status: DNR/DNI  Pt Overview and Major Events to Date:  9/29: Admitted  Assessment and Plan:  Jacolby Brow is an 82 year old male presenting with shortness of breath secondary to CHF exacerbation.  Overall his CHF exacerbation is resolving and his work of breathing is normalizing.  His anemia has stabilized and we have consulted gastroenterology for further evaluation of this patient.  He has a history of anemia, hypertension, type 2 diabetes, and coronary artery disease Assessment & Plan CHF exacerbation (HCC) Oxygenating well on room air and has been since admission.  Lung exam today is improved with only minimal crackling in the left lung base and mild crackling in the right lung base.  He has some dyspnea with exertion witnessed as he was sitting up for the lung exam today but he recovers quickly.  Lower extremity edema is mildly reduced today, and we will try to get compression stockings today as well.   - continue with 80 mg Lasix - Continue Flomax - Strict I's and O's and daily weights. - Place compression stocking on left lower extremity Type 2 diabetes mellitus with diabetic polyneuropathy, with long-term current use of insulin (HCC) Takes 12U Tresiba in AM, Jardiance 25mg  every day, Metformin 1000mg  BID, Pioglitazone 30mg  every day at home.  Patient has had appropriate response to increase in long-acting insulin yesterday with more appropriate blood sugars in the 100s and 200s.  We will continue with current regimen and continue to monitor - Semglee 10 units daily - Sensitive SSI - CBGs QID Chronic iron deficiency anemia Anemia appears to have stabilized for the time being, we will await GIs recommendations as  far as workup moving forward. -GI consult in place, appreciate recs Edema of both lower extremities Uncertain whether related to patient's recent surgery on his left lower extremity, or a consequence of his CHF exacerbation or both.  We will apply TED hose and continue with Lasix.   Chronic and Stable Problems:  HLD: Continue Crestor GERD: Continue Protonix BPH: Continue Flomax, hold home terazosin ISO low BP.   CAD: Continue home aspirin, holding home Plavix pending GI recommendations  FEN/GI: Carb modified diet PPx: holding due to concern for bleed Dispo:Home pending clinical improvement .   Subjective:  Patient was awake resting comfortably in bed on exam this morning.  He reports no concerns about his breathing but was very upset that he did not receive a compression sock for his left leg yesterday.  I reassured him that we would work on getting that done today.  Discussed with the patient that GI would see him today to further evaluate the source of his bleeding and he was happy to hear that.  Objective: Temp:  [98 F (36.7 C)-98.7 F (37.1 C)] 98.7 F (37.1 C) (10/02 0742) Pulse Rate:  [82-92] 88 (10/02 0742) Resp:  [15-20] 19 (10/02 0742) BP: (108-119)/(60-73) 110/62 (10/02 0742) SpO2:  [90 %-94 %] 91 % (10/02 0742) Weight:  [77.5 kg] 77.5 kg (10/02 0500) Physical Exam: General: Alert, comfortable.  Resting in bed, no apparent distress. Cardiovascular: RRR, no M/R/G Respiratory: Clear to auscultation in bilateral upper lung fields.  Reduced basilar crackles on the right side, minimal residual crackles on the left side in the base.  Good work of breathing. Abdomen: Flat,  soft. Extremities: 2+ pitting edema to the midshin, nontender, pedal pulse 2+.  Laboratory: Most recent CBC Lab Results  Component Value Date   WBC 7.1 07/06/2023   HGB 9.6 (L) 07/06/2023   HCT 30.4 (L) 07/06/2023   MCV 87.4 07/06/2023   PLT 126 (L) 07/06/2023   Most recent BMP    Latest Ref Rng  & Units 07/06/2023    2:16 AM  BMP  Glucose 70 - 99 mg/dL 161   BUN 8 - 23 mg/dL 29   Creatinine 0.96 - 1.24 mg/dL 0.45   Sodium 409 - 811 mmol/L 139   Potassium 3.5 - 5.1 mmol/L 3.0   Chloride 98 - 111 mmol/L 94   CO2 22 - 32 mmol/L 32   Calcium 8.9 - 10.3 mg/dL 8.6     Gerrit Heck, DO 07/06/2023, 8:50 AM  PGY-1, [facility] FPTS Intern pager: [phone], text pages welcome Secure chat group [facility] Teaching Service

## 2023-07-06 NOTE — Progress Notes (Signed)
Compression stocking to left leg applied. Tolerated well.

## 2023-07-06 NOTE — Assessment & Plan Note (Addendum)
Uncertain whether related to patient's recent surgery on his left lower extremity, or a consequence of his CHF exacerbation or both.  We will apply TED hose and continue with Lasix.

## 2023-07-06 NOTE — Progress Notes (Signed)
Left leg elevated with  2 pillows.

## 2023-07-06 NOTE — Progress Notes (Signed)
Had short runs of a-fib, asymptomatic and went right back to sinus. Continue to monitor.

## 2023-07-06 NOTE — Progress Notes (Signed)
New shrink er applied to right stump. Blister  the tip of the stump, refused any foam dressing applied.

## 2023-07-06 NOTE — Assessment & Plan Note (Signed)
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 - Reach out to patient's outpatient ortho provider about re-refitting patient's prosthetic - CTM

## 2023-07-07 DIAGNOSIS — E1142 Type 2 diabetes mellitus with diabetic polyneuropathy: Secondary | ICD-10-CM | POA: Diagnosis not present

## 2023-07-07 DIAGNOSIS — I2510 Atherosclerotic heart disease of native coronary artery without angina pectoris: Secondary | ICD-10-CM | POA: Diagnosis not present

## 2023-07-07 DIAGNOSIS — I255 Ischemic cardiomyopathy: Secondary | ICD-10-CM

## 2023-07-07 DIAGNOSIS — I959 Hypotension, unspecified: Secondary | ICD-10-CM

## 2023-07-07 DIAGNOSIS — E7800 Pure hypercholesterolemia, unspecified: Secondary | ICD-10-CM

## 2023-07-07 DIAGNOSIS — D696 Thrombocytopenia, unspecified: Secondary | ICD-10-CM

## 2023-07-07 DIAGNOSIS — I5023 Acute on chronic systolic (congestive) heart failure: Secondary | ICD-10-CM

## 2023-07-07 DIAGNOSIS — I509 Heart failure, unspecified: Secondary | ICD-10-CM

## 2023-07-07 DIAGNOSIS — I4891 Unspecified atrial fibrillation: Secondary | ICD-10-CM | POA: Insufficient documentation

## 2023-07-07 DIAGNOSIS — I5021 Acute systolic (congestive) heart failure: Secondary | ICD-10-CM

## 2023-07-07 DIAGNOSIS — Z794 Long term (current) use of insulin: Secondary | ICD-10-CM

## 2023-07-07 DIAGNOSIS — L8990 Pressure ulcer of unspecified site, unspecified stage: Secondary | ICD-10-CM | POA: Insufficient documentation

## 2023-07-07 DIAGNOSIS — D509 Iron deficiency anemia, unspecified: Secondary | ICD-10-CM

## 2023-07-07 DIAGNOSIS — D649 Anemia, unspecified: Secondary | ICD-10-CM | POA: Diagnosis not present

## 2023-07-07 DIAGNOSIS — I2583 Coronary atherosclerosis due to lipid rich plaque: Secondary | ICD-10-CM

## 2023-07-07 LAB — CBC
HCT: 33.4 % — ABNORMAL LOW (ref 39.0–52.0)
Hemoglobin: 10.4 g/dL — ABNORMAL LOW (ref 13.0–17.0)
MCH: 27.4 pg (ref 26.0–34.0)
MCHC: 31.1 g/dL (ref 30.0–36.0)
MCV: 88.1 fL (ref 80.0–100.0)
Platelets: 142 10*3/uL — ABNORMAL LOW (ref 150–400)
RBC: 3.79 MIL/uL — ABNORMAL LOW (ref 4.22–5.81)
RDW: 19.9 % — ABNORMAL HIGH (ref 11.5–15.5)
WBC: 5.9 10*3/uL (ref 4.0–10.5)
nRBC: 0 % (ref 0.0–0.2)

## 2023-07-07 LAB — BASIC METABOLIC PANEL
Anion gap: 11 (ref 5–15)
Anion gap: 12 (ref 5–15)
BUN: 22 mg/dL (ref 8–23)
BUN: 21 mg/dL (ref 8–23)
CO2: 34 mmol/L — ABNORMAL HIGH (ref 22–32)
CO2: 34 mmol/L — ABNORMAL HIGH (ref 22–32)
Calcium: 8.4 mg/dL — ABNORMAL LOW (ref 8.9–10.3)
Calcium: 8.6 mg/dL — ABNORMAL LOW (ref 8.9–10.3)
Chloride: 90 mmol/L — ABNORMAL LOW (ref 98–111)
Chloride: 90 mmol/L — ABNORMAL LOW (ref 98–111)
Creatinine, Ser: 1.41 mg/dL — ABNORMAL HIGH (ref 0.61–1.24)
Creatinine, Ser: 1.23 mg/dL (ref 0.61–1.24)
GFR, Estimated: 49 mL/min — ABNORMAL LOW (ref >60)
GFR, Estimated: 58 mL/min — ABNORMAL LOW (ref >60)
Glucose, Bld: 293 mg/dL — ABNORMAL HIGH (ref 70–99)
Glucose, Bld: 138 mg/dL — ABNORMAL HIGH (ref 70–99)
Potassium: 2.8 mmol/L — ABNORMAL LOW (ref 3.5–5.1)
Potassium: 3.6 mmol/L (ref 3.5–5.1)
Sodium: 135 mmol/L (ref 135–145)
Sodium: 136 mmol/L (ref 135–145)

## 2023-07-07 LAB — GLUCOSE, CAPILLARY
Glucose-Capillary: 315 mg/dL — ABNORMAL HIGH (ref 70–99)
Glucose-Capillary: 250 mg/dL — ABNORMAL HIGH (ref 70–99)
Glucose-Capillary: 246 mg/dL — ABNORMAL HIGH (ref 70–99)
Glucose-Capillary: 133 mg/dL — ABNORMAL HIGH (ref 70–99)

## 2023-07-07 LAB — MAGNESIUM: Magnesium: 1.9 mg/dL (ref 1.7–2.4)

## 2023-07-07 MED ORDER — POTASSIUM CHLORIDE CRYS ER 20 MEQ PO TBCR: 80.0000 meq | EXTENDED_RELEASE_TABLET | Freq: Once | ORAL | Status: DC

## 2023-07-07 MED ORDER — POTASSIUM CHLORIDE CRYS ER 20 MEQ PO TBCR: 40.0000 meq | EXTENDED_RELEASE_TABLET | Freq: Two times a day (BID) | ORAL | Status: DC

## 2023-07-07 MED ORDER — POTASSIUM CHLORIDE CRYS ER 20 MEQ PO TBCR: 60.0000 meq | EXTENDED_RELEASE_TABLET | Freq: Once | ORAL | Status: DC

## 2023-07-07 MED ORDER — SODIUM CHLORIDE 0.9 % IV SOLN: INTRAVENOUS | Status: DC

## 2023-07-07 MED ORDER — PEG-KCL-NACL-NASULF-NA ASC-C 100 G PO SOLR
1.0000 | Freq: Once | ORAL | Status: AC
  Administered 2023-07-07: 200 g via ORAL
  Filled 2023-07-07: qty 1

## 2023-07-07 MED ORDER — BISACODYL 5 MG PO TBEC
10.0000 mg | DELAYED_RELEASE_TABLET | Freq: Once | ORAL | Status: AC
  Administered 2023-07-07: 10 mg via ORAL
  Filled 2023-07-07: qty 2

## 2023-07-07 MED ORDER — POTASSIUM CHLORIDE 10 MEQ/100ML IV SOLN
10.0000 meq | INTRAVENOUS | Status: AC
  Administered 2023-07-07 (×4): 10 meq via INTRAVENOUS
  Filled 2023-07-07 (×4): qty 100

## 2023-07-07 MED ORDER — POTASSIUM CHLORIDE CRYS ER 20 MEQ PO TBCR
40.0000 meq | EXTENDED_RELEASE_TABLET | Freq: Two times a day (BID) | ORAL | Status: AC
  Administered 2023-07-07 – 2023-07-08 (×4): 40 meq via ORAL
  Filled 2023-07-07 (×4): qty 2

## 2023-07-07 NOTE — Assessment & Plan Note (Addendum)
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 -Plan for outpatient follow-up with orthopedics at discharge, with Dr. Lajoyce Corners -Continue compression and wound care

## 2023-07-07 NOTE — Assessment & Plan Note (Addendum)
CBGs not at goal of 140-180 but will not increase insulin as he is completing prep for colonoscopy and plan for surgery tomorrow - Continue Semglee 10u for now -Continue sensitive SSI - CBGs QID

## 2023-07-07 NOTE — Assessment & Plan Note (Signed)
Hgb 7.2, 9.1 on 9/18. Appears it has been around 9 since July. Had reported unremarkable EGD 3 weeks ago while hospitalized for hematemesis, but has had dark stools since discharge. Platelets 102.  - 1U PRBCs - Transfusion threshold >8 - holding home aspirin 81mg , plavix 75mg  - Consider GI consult for melena and anemia - am CBC - IV PPI

## 2023-07-07 NOTE — Assessment & Plan Note (Addendum)
Pending a.m. CBC.  No overt signs of bleeding. -GI following-tentatively plan for inpatient colonoscopy 07/08/2023 s/p Plavix washout -Begin colonoscopy prep today per GI, appreciate their care

## 2023-07-07 NOTE — Progress Notes (Signed)
Physical Therapy Treatment Patient Details Name: Keith Espinoza MRN: [id number] DOB: 1941-03-10 Today's Date: 07/07/2023   History of Present Illness 81yo who presented on 07/03/23 with SOB, CXR concerning for R pleural effusion. Of note did have a L femur fracture and subsequent ORIF in August 2024. Admitted with suspected SOB in setting of acute CHF exacerbation. PMH CAD, MI, IDDM, gilbert syndrome, HTN, HLD, gout, R BKA, cholecystectomy, back surgery    PT Comments  Pt received in supine and agreeable to session. Pt able to perform bed mobility without physical assist, however relies heavily on bed features with HOB elevated and use of rails. Pt able to perform seated exercises with BUE support for balance. Pt declines transfer to chair due to not wanting to "strain himself". Pt would benefit from transfer practice prior to discharge to ensure safety at home. Pt reporting desire to return home on Saturday. Pt continues to benefit from PT services to progress toward functional mobility goals.     If plan is discharge home, recommend the following: Two people to help with walking and/or transfers;A lot of help with bathing/dressing/bathroom;Assistance with cooking/housework;Direct supervision/assist for medications management;Assist for transportation;A lot of help with walking and/or transfers;Two people to help with bathing/dressing/bathroom;Direct supervision/assist for financial management;Help with stairs or ramp for entrance   Can travel by private vehicle     No  Equipment Recommendations  Other (comment) (defer to next venue)    Recommendations for Other Services       Precautions / Restrictions Precautions Precautions: Fall;Other (comment) Precaution Comments: chronic R BKA (with blister), recent L LE femur fracture s/p ORIF now NWB Restrictions Weight Bearing Restrictions: Yes LLE Weight Bearing: Non weight bearing     Mobility  Bed Mobility Overal bed mobility:  Needs Assistance Bed Mobility: Supine to Sit     Supine to sit: Supervision, HOB elevated, Used rails     General bed mobility comments: Pt able to sit to EOB with increased time and heavy use of bed features.    Transfers                   General transfer comment: Pt declined transfer to w/c despite encouragement      Balance Overall balance assessment: Needs assistance Sitting-balance support: No upper extremity supported, Feet supported Sitting balance-Leahy Scale: Good Sitting balance - Comments: sitting EOB                                    Cognition Arousal: Alert Behavior During Therapy: WFL for tasks assessed/performed Overall Cognitive Status: Within Functional Limits for tasks assessed                                          Exercises General Exercises - Lower Extremity Long Arc Quad: AROM, Seated, Both, 20 reps Hip Flexion/Marching: AROM, Seated, Both, 15 reps    General Comments        Pertinent Vitals/Pain Pain Assessment Pain Assessment: No/denies pain     PT Goals (current goals can now be found in the care plan section) Acute Rehab PT Goals Patient Stated Goal: get out of this room and eat lunch PT Goal Formulation: With patient Time For Goal Achievement: 07/18/23 Progress towards PT goals: Progressing toward goals    Frequency    Min 1X/week  AM-PAC PT "6 Clicks" Mobility   Outcome Measure  Help needed turning from your back to your side while in a flat bed without using bedrails?: None Help needed moving from lying on your back to sitting on the side of a flat bed without using bedrails?: A Lot (without rails) Help needed moving to and from a bed to a chair (including a wheelchair)?: Total Help needed standing up from a chair using your arms (e.g., wheelchair or bedside chair)?: Total Help needed to walk in hospital room?: Total Help needed climbing 3-5 steps with a railing? : Total 6  Click Score: 10    End of Session   Activity Tolerance: Patient tolerated treatment well Patient left: in bed;with call bell/phone within reach;Other (comment);with bed alarm set (sitting EOB, RN aware) Nurse Communication: Mobility status PT Visit Diagnosis: Muscle weakness (generalized) (M62.81);Difficulty in walking, not elsewhere classified (R26.2);Other abnormalities of gait and mobility (R26.89)     Time: 7829-5621 PT Time Calculation (min) (ACUTE ONLY): 20 min  Charges:    $Therapeutic Exercise: 8-22 mins PT General Charges $$ ACUTE PT VISIT: 1 Visit                    [name], PTA Acute Rehabilitation Services Secure Chat Preferred  [phone]    [name] 07/07/2023, 1:49 PM

## 2023-07-07 NOTE — Assessment & Plan Note (Addendum)
Now euvolemic, transitioned IV to p.o. diuretic 10/2. -Continue p.o. furosemide 80 mg daily - Continue Flomax - Strict I's and O's and daily weights. - Place compression stocking on left lower extremity

## 2023-07-07 NOTE — Progress Notes (Signed)
Daily Progress Note Intern Pager: [phone]  Patient name: Keith Espinoza Medical record number: [id number] Date of birth: [birth year] Age: 82 y.o. Gender: male  Primary Care Provider: Emilio Aspen, MD Consultants: Cardiology, gastroenterology Code Status: DNR DNI  Pt Overview and Major Events to Date:  9/29: Admitted 10/1:  Assessment and Plan: Keith Espinoza is an 82 year old male with remote history of STEMI last month in September admitted for acute HFrEF exacerbation, which is since improved.  Now foregoing workup for worsening chronic iron deficiency anemia. PMH/PSH includes hypertension, T2DM, CAD SP for stent PCI, acute on chronic anemia. Assessment & Plan CHF exacerbation (HCC) Now euvolemic, transitioned IV to p.o. diuretic 10/2. -Continue p.o. furosemide 80 mg daily - Continue Flomax - Strict I's and O's and daily weights. - Place compression stocking on left lower extremity Chronic iron deficiency anemia Pending a.m. CBC.  No overt signs of bleeding. -GI following-tentatively plan for inpatient colonoscopy 07/08/2023 s/p Plavix washout -Begin colonoscopy prep today per GI, appreciate their care  Type 2 diabetes mellitus with diabetic polyneuropathy, with long-term current use of insulin (HCC) CBGs not at goal of 140-180 but will not increase insulin as he is completing prep for colonoscopy and plan for surgery tomorrow - Continue Semglee 10u for now -Continue sensitive SSI - CBGs QID Edema of both lower extremities Uncertain whether related to patient's recent surgery on his left lower extremity, or a consequence of his CHF exacerbation or both.   -Continue TED hose  Blister Large yellow, fluid-filled bullae on R leg likely secondary to improperly fitting prosthetic. -Plan for outpatient follow-up with orthopedics at discharge, with Dr. Lajoyce Espinoza -Continue compression and wound care    Chronic and Stable Problems:  HLD: Continue  Crestor GERD: Continue Protonix BPH: Continue Flomax, hold home terazosin ISO low BP.   CAD: Continue home aspirin, holding home Plavix pending GI recommendations   FEN/GI: Carb modified diet, likely will need to be n.p.o. after midnight for colonoscopy in the morning on 10/4 PPx: SCD, Lovenox? Dispo:Pending PT recommendations  pending clinical improvement . Barriers include further inpatient workup.   Subjective:  Resting comfortably in bed.  Wearing his compression stocking on his left leg and shrinker on his right stump. He tells me is ready for his colonoscopy and that he is glad that he is getting a workup for his anemia. He is looking forward to the fish fry on Saturday and hopes to be home by then. Denies any pain anywhere.  He says he is eating well.  He is little worried about doing the colonoscopy prep today because of not being able to get up and move around and use the bathroom on his own.  Objective: Temp:  [97.6 F (36.4 C)-98.4 F (36.9 C)] 98.1 F (36.7 C) (10/03 0740) Pulse Rate:  [83-176] 176 (10/03 0740) Resp:  [16-21] 16 (10/03 0740) BP: (109-132)/(58-88) 118/67 (10/03 0740) SpO2:  [86 %-96 %] 86 % (10/03 0740) Weight:  [73.2 kg] 73.2 kg (10/03 5188) Physical Exam: General: Elderly male, resting in bed in no distress Cardiovascular: Regular rate and rhythm Respiratory: Speaks in full sentences.  Normal work of breathing on room air.  No wheezing or crackles. Abdomen: Soft, NTND. Normoactive bowel sounds Extremities: No edema with left compression stocking on. Right stump with shrinker on.  Laboratory: Most recent CBC Lab Results  Component Value Date   WBC 7.1 07/06/2023   HGB 9.6 (L) 07/06/2023   HCT 30.4 (L) 07/06/2023  MCV 87.4 07/06/2023   PLT 126 (L) 07/06/2023   Most recent BMP    Latest Ref Rng & Units 07/06/2023    2:16 AM  BMP  Glucose 70 - 99 mg/dL 409   BUN 8 - 23 mg/dL 29   Creatinine 8.11 - 1.24 mg/dL 9.14   Sodium 782 - 956 mmol/L  139   Potassium 3.5 - 5.1 mmol/L 3.0   Chloride 98 - 111 mmol/L 94   CO2 22 - 32 mmol/L 32   Calcium 8.9 - 10.3 mg/dL 8.6     Keith Dash, DO 07/07/2023, 9:21 AM  PGY-3, [facility] FPTS Intern pager: [phone], text pages welcome Secure chat group [facility] Teaching Service

## 2023-07-07 NOTE — Assessment & Plan Note (Signed)
Arrival Hgb 7.2 > 1 unit pRBC > 8.8 > 10.1 this morning. Patient's baseline appears around 9 since July 2024. Patient worked up for hematemesis 3 weeks prior with unremarkable EGD. No new bleeding symptoms at this time.  - Restart home aspirin 81, hold plavix 75 - Transition to PO PPI (Protonix) - Transfuse for Hgb <8 - CTM for bleeding symptoms, consider GI consult if needed - AM CBC

## 2023-07-07 NOTE — Plan of Care (Signed)
  Problem: Education: Goal: Ability to describe self-care measures that may prevent or decrease complications (Diabetes Survival Skills Education) will improve Outcome: Progressing Goal: Individualized Educational Video(s) Outcome: Progressing   Problem: Coping: Goal: Ability to adjust to condition or change in health will improve Outcome: Progressing   Problem: Fluid Volume: Goal: Ability to maintain a balanced intake and output will improve Outcome: Progressing   Problem: Health Behavior/Discharge Planning: Goal: Ability to identify and utilize available resources and services will improve Outcome: Progressing Goal: Ability to manage health-related needs will improve Outcome: Progressing   Problem: Metabolic: Goal: Ability to maintain appropriate glucose levels will improve Outcome: Progressing   Problem: Nutritional: Goal: Maintenance of adequate nutrition will improve Outcome: Progressing Goal: Progress toward achieving an optimal weight will improve Outcome: Progressing   Problem: Skin Integrity: Goal: Risk for impaired skin integrity will decrease Outcome: Progressing   Problem: Tissue Perfusion: Goal: Adequacy of tissue perfusion will improve Outcome: Progressing   Problem: Education: Goal: Knowledge of General Education information will improve Description: Including pain rating scale, medication(s)/side effects and non-pharmacologic comfort measures Outcome: Progressing   Problem: Health Behavior/Discharge Planning: Goal: Ability to manage health-related needs will improve Outcome: Progressing   Problem: Clinical Measurements: Goal: Ability to maintain clinical measurements within normal limits will improve Outcome: Progressing Goal: Will remain free from infection Outcome: Progressing Goal: Diagnostic test results will improve Outcome: Progressing Goal: Respiratory complications will improve Outcome: Progressing Goal: Cardiovascular complication will  be avoided Outcome: Progressing   Problem: Activity: Goal: Risk for activity intolerance will decrease Outcome: Progressing   Problem: Nutrition: Goal: Adequate nutrition will be maintained Outcome: Progressing   Problem: Coping: Goal: Level of anxiety will decrease Outcome: Progressing   Problem: Elimination: Goal: Will not experience complications related to bowel motility Outcome: Progressing Goal: Will not experience complications related to urinary retention Outcome: Progressing   Problem: Pain Managment: Goal: General experience of comfort will improve Outcome: Progressing   Problem: Safety: Goal: Ability to remain free from injury will improve Outcome: Progressing   Problem: Skin Integrity: Goal: Risk for impaired skin integrity will decrease Outcome: Progressing   Problem: Education: Goal: Ability to demonstrate management of disease process will improve Outcome: Progressing Goal: Ability to verbalize understanding of medication therapies will improve Outcome: Progressing Goal: Individualized Educational Video(s) Outcome: Progressing   Problem: Activity: Goal: Capacity to carry out activities will improve Outcome: Progressing   Problem: Cardiac: Goal: Ability to achieve and maintain adequate cardiopulmonary perfusion will improve Outcome: Progressing   

## 2023-07-07 NOTE — Consult Note (Signed)
Cardiology Consultation   Patient ID: Keith Espinoza MRN: [id number]; DOB: [birth year]  Admit date: 07/03/2023 Date of Consult: 07/07/2023  PCP:  Keith Aspen, MD    HeartCare Providers Cardiologist:  Keith Muss, MD   {  Patient Profile:   Keith Espinoza is a 82 y.o. male with a hx of coronary artery disease DES to LAD, RCA, left circumflex in 2004, RCA 2012, circumflex 2013, diabetes mellitus, hypertension, peripheral vascular disease, s/p RBKA 12/2020, anemia, who is being seen 07/07/2023 for the evaluation of afib at the request of Dr. Linwood Espinoza.  History of Present Illness:   Keith Espinoza has extensive coronary artery disease.  He was recently admitted in July 2024 and underwent cardiac catheterization for unstable angina noted to have severe multivessel coronary artery disease with moderate in-stent restenosis of his 3 main vessels.  He was evaluated by CT surgery who felt he would not be a good CABG candidate so plan has been for medical management.  Currently patient is being evaluated for acute CHF exacerbation as well as acute on chronic anemia requiring 1 unit of PRBC transfusion hgh 9.5>7.3 on admission.  No signs of overt bleeding currently.  He has had a recent EGD at [facility] that revealed erythematous gastritis with superficial erosions.  Plan currently is for colonoscopy tomorrow with plans to repeat upper endoscopy if colonoscopy is negative.  Currently GI is holding his Plavix. From a heart failure standpoint it is thought that his symptoms and volume status are improving and has now been transitioned back to p.o. Lasix 80 mg.  Cardiology has been asked to consult due to new onset atrial fibrillation.  He has a echo here that shows mildly reduced EF 40 to 45% with New regional wall motion abnormalities.  Currently patient has no complaints and states that he only has a mild cough.  Seems that his hemoptysis that he had approximately 1  month ago was his primary concern.  He reports no chest pain or any significant cardiac symptoms such as orthopnea, dizziness, palpitations.  He is resting comfortably in bed.  Heart rates are well-controlled less than 100 generally with brief episodes in the 120s.    Potassium 2.8, chloride 90, CO2 34, creatinine 1.4, albumin 2.6, BNP 1200+.  Troponin 19-22.  Chest x-ray indicating low lung volumes with combination of small pleural effusion with overlying atelectasis.  Past Medical History:  Diagnosis Date   Allergic rhinitis    Allergic rhinitis    Arthritis    Basal cell carcinoma 11/01/2019    bcc left chest treatment [place] cx3 48fu    Chronic leg pain    right   Chronic lower back pain    Coronary artery disease    a. Stenting to RCA 2004; staged DES to LAD and Cx 2004. DES to [facility] 2012. b. DES to mCx, PTCA to dCx 11/2011. c. Lateral wall MI 2013 s/p PTCA to distal Cx & DES to mid OM2 11/2011. d. Low risk nuc 04/2014, EF wnl.   COVID-19    Diabetes mellitus    Insulin dependent   Diabetic neuropathy (HCC)    MILD   Diverticulosis    Dysrhythmia    [name] syndrome    Gout    right wrist; right foot; right elbow; have had it since 1970's   H/O hiatal hernia    Heart murmur    History of echocardiogram    aortic sclerosis per echo 12/09 EF 65%, otherwise normal  STENT is widely patent.   Prox RCA-1 lesion is 70% stenosed.  Prox RCA-2 lesion is 85% stenosed. Prox RCA-3 lesion is 80% stenosed. Prox RCA to Mid RCA lesion is 95% stenosed.   Mid RCA STENT is 35% stenosed.   RPAV lesion is 60% stenosed.   RPDA lesion is 80% stenosed.   There is no aortic valve stenosis.    Severe MV CAD - with moderate ISR in LAD/RCA & LCx but each stent with stent edge lesions.  Best option is CABG --    RECOMMENDATIONS     CVTS Consultation   Recommend Aspirin 81mg  daily for moderate CAD.   D/c Plavix for planned CVTS consult; Have ordered IV Heparin 2 hr post TR Band off  Laboratory Data:  High Sensitivity Troponin:   Recent Labs  Lab 07/03/23 1305 07/03/23 1517  TROPONINIHS  19* 22*     Chemistry Recent Labs  Lab 07/04/23 0609 07/05/23 0302 07/06/23 0216 07/07/23 1113  NA 134* 133* 139 135  K 4.0 3.6 3.0* 2.8*  CL 97* 97* 94* 90*  CO2 29 30 32 34*  GLUCOSE 219* 205* 118* 293*  BUN 27* 31* 29* 22  CREATININE 1.56* 1.56* 1.49* 1.41*  CALCIUM 8.8* 8.4* 8.6* 8.4*  MG 1.9  --  1.8 1.9  GFRNONAA 44* 44* 46* 49*  ANIONGAP 8 6 13 11     Recent Labs  Lab 07/03/23 1305 07/04/23 0609  PROT 5.1* 5.9*  ALBUMIN 2.3* 2.6*  AST 14* 15  ALT 12 14  ALKPHOS 72 75  BILITOT 0.6 0.8   Lipids No results for input(s): "CHOL", "TRIG", "HDL", "LABVLDL", "LDLCALC", "CHOLHDL" in the last 168 hours.  Hematology Recent Labs  Lab 07/05/23 0302 07/05/23 1454 07/06/23 0216 07/07/23 1113  WBC 6.0  --  7.1 5.9  RBC 3.25*  --  3.48* 3.79*  HGB 9.0* 9.8* 9.6* 10.4*  HCT 28.8* 30.9* 30.4* 33.4*  MCV 88.6  --  87.4 88.1  MCH 27.7  --  27.6 27.4  MCHC 31.3  --  31.6 31.1  RDW 20.7*  --  19.9* 19.9*  PLT 104*  --  126* 142*   Thyroid No results for input(s): "TSH", "FREET4" in the last 168 hours.  BNP Recent Labs  Lab 07/03/23 1305  BNP 1,247.5*    DDimer No results for input(s): "DDIMER" in the last 168 hours.   Radiology/Studies:  ECHOCARDIOGRAM COMPLETE  Result Date: 07/04/2023    ECHOCARDIOGRAM REPORT   Patient Name:   Keith Espinoza Date of Exam: 07/04/2023 Medical Rec #:  [id number]              Height:       70.0 in Accession #:    [id number]             Weight:       164.0 lb Date of Birth:  [birth year]              BSA:          1.919 m Patient Age:    81 years               BP:           117/58 mmHg Patient Gender: M                      HR:           73 bpm. Exam Location:  Inpatient Procedure: 2D Echo, Cardiac  Cardiology Consultation   Patient ID: Keith Espinoza MRN: [id number]; DOB: [birth year]  Admit date: 07/03/2023 Date of Consult: 07/07/2023  PCP:  Keith Aspen, MD    HeartCare Providers Cardiologist:  Keith Muss, MD   {  Patient Profile:   Keith Espinoza is a 82 y.o. male with a hx of coronary artery disease DES to LAD, RCA, left circumflex in 2004, RCA 2012, circumflex 2013, diabetes mellitus, hypertension, peripheral vascular disease, s/p RBKA 12/2020, anemia, who is being seen 07/07/2023 for the evaluation of afib at the request of Dr. Linwood Espinoza.  History of Present Illness:   Keith Espinoza has extensive coronary artery disease.  He was recently admitted in July 2024 and underwent cardiac catheterization for unstable angina noted to have severe multivessel coronary artery disease with moderate in-stent restenosis of his 3 main vessels.  He was evaluated by CT surgery who felt he would not be a good CABG candidate so plan has been for medical management.  Currently patient is being evaluated for acute CHF exacerbation as well as acute on chronic anemia requiring 1 unit of PRBC transfusion hgh 9.5>7.3 on admission.  No signs of overt bleeding currently.  He has had a recent EGD at [facility] that revealed erythematous gastritis with superficial erosions.  Plan currently is for colonoscopy tomorrow with plans to repeat upper endoscopy if colonoscopy is negative.  Currently GI is holding his Plavix. From a heart failure standpoint it is thought that his symptoms and volume status are improving and has now been transitioned back to p.o. Lasix 80 mg.  Cardiology has been asked to consult due to new onset atrial fibrillation.  He has a echo here that shows mildly reduced EF 40 to 45% with New regional wall motion abnormalities.  Currently patient has no complaints and states that he only has a mild cough.  Seems that his hemoptysis that he had approximately 1  month ago was his primary concern.  He reports no chest pain or any significant cardiac symptoms such as orthopnea, dizziness, palpitations.  He is resting comfortably in bed.  Heart rates are well-controlled less than 100 generally with brief episodes in the 120s.    Potassium 2.8, chloride 90, CO2 34, creatinine 1.4, albumin 2.6, BNP 1200+.  Troponin 19-22.  Chest x-ray indicating low lung volumes with combination of small pleural effusion with overlying atelectasis.  Past Medical History:  Diagnosis Date   Allergic rhinitis    Allergic rhinitis    Arthritis    Basal cell carcinoma 11/01/2019    bcc left chest treatment [place] cx3 48fu    Chronic leg pain    right   Chronic lower back pain    Coronary artery disease    a. Stenting to RCA 2004; staged DES to LAD and Cx 2004. DES to [facility] 2012. b. DES to mCx, PTCA to dCx 11/2011. c. Lateral wall MI 2013 s/p PTCA to distal Cx & DES to mid OM2 11/2011. d. Low risk nuc 04/2014, EF wnl.   COVID-19    Diabetes mellitus    Insulin dependent   Diabetic neuropathy (HCC)    MILD   Diverticulosis    Dysrhythmia    [name] syndrome    Gout    right wrist; right foot; right elbow; have had it since 1970's   H/O hiatal hernia    Heart murmur    History of echocardiogram    aortic sclerosis per echo 12/09 EF 65%, otherwise normal  Cardiology Consultation   Patient ID: Keith Espinoza MRN: [id number]; DOB: [birth year]  Admit date: 07/03/2023 Date of Consult: 07/07/2023  PCP:  Keith Aspen, MD    HeartCare Providers Cardiologist:  Keith Muss, MD   {  Patient Profile:   Keith Espinoza is a 82 y.o. male with a hx of coronary artery disease DES to LAD, RCA, left circumflex in 2004, RCA 2012, circumflex 2013, diabetes mellitus, hypertension, peripheral vascular disease, s/p RBKA 12/2020, anemia, who is being seen 07/07/2023 for the evaluation of afib at the request of Dr. Linwood Espinoza.  History of Present Illness:   Keith Espinoza has extensive coronary artery disease.  He was recently admitted in July 2024 and underwent cardiac catheterization for unstable angina noted to have severe multivessel coronary artery disease with moderate in-stent restenosis of his 3 main vessels.  He was evaluated by CT surgery who felt he would not be a good CABG candidate so plan has been for medical management.  Currently patient is being evaluated for acute CHF exacerbation as well as acute on chronic anemia requiring 1 unit of PRBC transfusion hgh 9.5>7.3 on admission.  No signs of overt bleeding currently.  He has had a recent EGD at [facility] that revealed erythematous gastritis with superficial erosions.  Plan currently is for colonoscopy tomorrow with plans to repeat upper endoscopy if colonoscopy is negative.  Currently GI is holding his Plavix. From a heart failure standpoint it is thought that his symptoms and volume status are improving and has now been transitioned back to p.o. Lasix 80 mg.  Cardiology has been asked to consult due to new onset atrial fibrillation.  He has a echo here that shows mildly reduced EF 40 to 45% with New regional wall motion abnormalities.  Currently patient has no complaints and states that he only has a mild cough.  Seems that his hemoptysis that he had approximately 1  month ago was his primary concern.  He reports no chest pain or any significant cardiac symptoms such as orthopnea, dizziness, palpitations.  He is resting comfortably in bed.  Heart rates are well-controlled less than 100 generally with brief episodes in the 120s.    Potassium 2.8, chloride 90, CO2 34, creatinine 1.4, albumin 2.6, BNP 1200+.  Troponin 19-22.  Chest x-ray indicating low lung volumes with combination of small pleural effusion with overlying atelectasis.  Past Medical History:  Diagnosis Date   Allergic rhinitis    Allergic rhinitis    Arthritis    Basal cell carcinoma 11/01/2019    bcc left chest treatment TX cx3 48fu    Chronic leg pain    right   Chronic lower back pain    Coronary artery disease    a. Stenting to RCA 2004; staged DES to LAD and Cx 2004. DES to [facility] 2012. b. DES to mCx, PTCA to dCx 11/2011. c. Lateral wall MI 2013 s/p PTCA to distal Cx & DES to mid OM2 11/2011. d. Low risk nuc 04/2014, EF wnl.   COVID-19    Diabetes mellitus    Insulin dependent   Diabetic neuropathy (HCC)    MILD   Diverticulosis    Dysrhythmia    [name] syndrome    Gout    right wrist; right foot; right elbow; have had it since 1970's   H/O hiatal hernia    Heart murmur    History of echocardiogram    aortic sclerosis per echo 12/09 EF 65%, otherwise normal  Cardiology Consultation   Patient ID: Keith Espinoza MRN: [id number]; DOB: [birth year]  Admit date: 07/03/2023 Date of Consult: 07/07/2023  PCP:  Keith Aspen, MD    HeartCare Providers Cardiologist:  Keith Muss, MD   {  Patient Profile:   Keith Espinoza is a 82 y.o. male with a hx of coronary artery disease DES to LAD, RCA, left circumflex in 2004, RCA 2012, circumflex 2013, diabetes mellitus, hypertension, peripheral vascular disease, s/p RBKA 12/2020, anemia, who is being seen 07/07/2023 for the evaluation of afib at the request of Dr. Linwood Espinoza.  History of Present Illness:   Keith Espinoza has extensive coronary artery disease.  He was recently admitted in July 2024 and underwent cardiac catheterization for unstable angina noted to have severe multivessel coronary artery disease with moderate in-stent restenosis of his 3 main vessels.  He was evaluated by CT surgery who felt he would not be a good CABG candidate so plan has been for medical management.  Currently patient is being evaluated for acute CHF exacerbation as well as acute on chronic anemia requiring 1 unit of PRBC transfusion hgh 9.5>7.3 on admission.  No signs of overt bleeding currently.  He has had a recent EGD at [facility] that revealed erythematous gastritis with superficial erosions.  Plan currently is for colonoscopy tomorrow with plans to repeat upper endoscopy if colonoscopy is negative.  Currently GI is holding his Plavix. From a heart failure standpoint it is thought that his symptoms and volume status are improving and has now been transitioned back to p.o. Lasix 80 mg.  Cardiology has been asked to consult due to new onset atrial fibrillation.  He has a echo here that shows mildly reduced EF 40 to 45% with New regional wall motion abnormalities.  Currently patient has no complaints and states that he only has a mild cough.  Seems that his hemoptysis that he had approximately 1  month ago was his primary concern.  He reports no chest pain or any significant cardiac symptoms such as orthopnea, dizziness, palpitations.  He is resting comfortably in bed.  Heart rates are well-controlled less than 100 generally with brief episodes in the 120s.    Potassium 2.8, chloride 90, CO2 34, creatinine 1.4, albumin 2.6, BNP 1200+.  Troponin 19-22.  Chest x-ray indicating low lung volumes with combination of small pleural effusion with overlying atelectasis.  Past Medical History:  Diagnosis Date   Allergic rhinitis    Allergic rhinitis    Arthritis    Basal cell carcinoma 11/01/2019    bcc left chest treatment TX cx3 48fu    Chronic leg pain    right   Chronic lower back pain    Coronary artery disease    a. Stenting to RCA 2004; staged DES to LAD and Cx 2004. DES to [facility] 2012. b. DES to mCx, PTCA to dCx 11/2011. c. Lateral wall MI 2013 s/p PTCA to distal Cx & DES to mid OM2 11/2011. d. Low risk nuc 04/2014, EF wnl.   COVID-19    Diabetes mellitus    Insulin dependent   Diabetic neuropathy (HCC)    MILD   Diverticulosis    Dysrhythmia    [name] syndrome    Gout    right wrist; right foot; right elbow; have had it since 1970's   H/O hiatal hernia    Heart murmur    History of echocardiogram    aortic sclerosis per echo 12/09 EF 65%, otherwise normal  STENT is widely patent.   Prox RCA-1 lesion is 70% stenosed.  Prox RCA-2 lesion is 85% stenosed. Prox RCA-3 lesion is 80% stenosed. Prox RCA to Mid RCA lesion is 95% stenosed.   Mid RCA STENT is 35% stenosed.   RPAV lesion is 60% stenosed.   RPDA lesion is 80% stenosed.   There is no aortic valve stenosis.    Severe MV CAD - with moderate ISR in LAD/RCA & LCx but each stent with stent edge lesions.  Best option is CABG --    RECOMMENDATIONS     CVTS Consultation   Recommend Aspirin 81mg  daily for moderate CAD.   D/c Plavix for planned CVTS consult; Have ordered IV Heparin 2 hr post TR Band off  Laboratory Data:  High Sensitivity Troponin:   Recent Labs  Lab 07/03/23 1305 07/03/23 1517  TROPONINIHS  19* 22*     Chemistry Recent Labs  Lab 07/04/23 0609 07/05/23 0302 07/06/23 0216 07/07/23 1113  NA 134* 133* 139 135  K 4.0 3.6 3.0* 2.8*  CL 97* 97* 94* 90*  CO2 29 30 32 34*  GLUCOSE 219* 205* 118* 293*  BUN 27* 31* 29* 22  CREATININE 1.56* 1.56* 1.49* 1.41*  CALCIUM 8.8* 8.4* 8.6* 8.4*  MG 1.9  --  1.8 1.9  GFRNONAA 44* 44* 46* 49*  ANIONGAP 8 6 13 11     Recent Labs  Lab 07/03/23 1305 07/04/23 0609  PROT 5.1* 5.9*  ALBUMIN 2.3* 2.6*  AST 14* 15  ALT 12 14  ALKPHOS 72 75  BILITOT 0.6 0.8   Lipids No results for input(s): "CHOL", "TRIG", "HDL", "LABVLDL", "LDLCALC", "CHOLHDL" in the last 168 hours.  Hematology Recent Labs  Lab 07/05/23 0302 07/05/23 1454 07/06/23 0216 07/07/23 1113  WBC 6.0  --  7.1 5.9  RBC 3.25*  --  3.48* 3.79*  HGB 9.0* 9.8* 9.6* 10.4*  HCT 28.8* 30.9* 30.4* 33.4*  MCV 88.6  --  87.4 88.1  MCH 27.7  --  27.6 27.4  MCHC 31.3  --  31.6 31.1  RDW 20.7*  --  19.9* 19.9*  PLT 104*  --  126* 142*   Thyroid No results for input(s): "TSH", "FREET4" in the last 168 hours.  BNP Recent Labs  Lab 07/03/23 1305  BNP 1,247.5*    DDimer No results for input(s): "DDIMER" in the last 168 hours.   Radiology/Studies:  ECHOCARDIOGRAM COMPLETE  Result Date: 07/04/2023    ECHOCARDIOGRAM REPORT   Patient Name:   Keith Espinoza Date of Exam: 07/04/2023 Medical Rec #:  [id number]              Height:       70.0 in Accession #:    [id number]             Weight:       164.0 lb Date of Birth:  [birth year]              BSA:          1.919 m Patient Age:    81 years               BP:           117/58 mmHg Patient Gender: M                      HR:           73 bpm. Exam Location:  Inpatient Procedure: 2D Echo, Cardiac  Cardiology Consultation   Patient ID: Keith Espinoza MRN: [id number]; DOB: [birth year]  Admit date: 07/03/2023 Date of Consult: 07/07/2023  PCP:  Keith Aspen, MD    HeartCare Providers Cardiologist:  Keith Muss, MD   {  Patient Profile:   Keith Espinoza is a 82 y.o. male with a hx of coronary artery disease DES to LAD, RCA, left circumflex in 2004, RCA 2012, circumflex 2013, diabetes mellitus, hypertension, peripheral vascular disease, s/p RBKA 12/2020, anemia, who is being seen 07/07/2023 for the evaluation of afib at the request of Dr. Linwood Espinoza.  History of Present Illness:   Keith Espinoza has extensive coronary artery disease.  He was recently admitted in July 2024 and underwent cardiac catheterization for unstable angina noted to have severe multivessel coronary artery disease with moderate in-stent restenosis of his 3 main vessels.  He was evaluated by CT surgery who felt he would not be a good CABG candidate so plan has been for medical management.  Currently patient is being evaluated for acute CHF exacerbation as well as acute on chronic anemia requiring 1 unit of PRBC transfusion hgh 9.5>7.3 on admission.  No signs of overt bleeding currently.  He has had a recent EGD at [facility] that revealed erythematous gastritis with superficial erosions.  Plan currently is for colonoscopy tomorrow with plans to repeat upper endoscopy if colonoscopy is negative.  Currently GI is holding his Plavix. From a heart failure standpoint it is thought that his symptoms and volume status are improving and has now been transitioned back to p.o. Lasix 80 mg.  Cardiology has been asked to consult due to new onset atrial fibrillation.  He has a echo here that shows mildly reduced EF 40 to 45% with New regional wall motion abnormalities.  Currently patient has no complaints and states that he only has a mild cough.  Seems that his hemoptysis that he had approximately 1  month ago was his primary concern.  He reports no chest pain or any significant cardiac symptoms such as orthopnea, dizziness, palpitations.  He is resting comfortably in bed.  Heart rates are well-controlled less than 100 generally with brief episodes in the 120s.    Potassium 2.8, chloride 90, CO2 34, creatinine 1.4, albumin 2.6, BNP 1200+.  Troponin 19-22.  Chest x-ray indicating low lung volumes with combination of small pleural effusion with overlying atelectasis.  Past Medical History:  Diagnosis Date   Allergic rhinitis    Allergic rhinitis    Arthritis    Basal cell carcinoma 11/01/2019    bcc left chest treatment TX cx3 48fu    Chronic leg pain    right   Chronic lower back pain    Coronary artery disease    a. Stenting to RCA 2004; staged DES to LAD and Cx 2004. DES to [facility] 2012. b. DES to mCx, PTCA to dCx 11/2011. c. Lateral wall MI 2013 s/p PTCA to distal Cx & DES to mid OM2 11/2011. d. Low risk nuc 04/2014, EF wnl.   COVID-19    Diabetes mellitus    Insulin dependent   Diabetic neuropathy (HCC)    MILD   Diverticulosis    Dysrhythmia    [name] syndrome    Gout    right wrist; right foot; right elbow; have had it since 1970's   H/O hiatal hernia    Heart murmur    History of echocardiogram    aortic sclerosis per echo 12/09 EF 65%, otherwise normal  Cardiology Consultation   Patient ID: Keith Espinoza MRN: [id number]; DOB: [birth year]  Admit date: 07/03/2023 Date of Consult: 07/07/2023  PCP:  Keith Aspen, MD    HeartCare Providers Cardiologist:  Keith Muss, MD   {  Patient Profile:   Keith Espinoza is a 82 y.o. male with a hx of coronary artery disease DES to LAD, RCA, left circumflex in 2004, RCA 2012, circumflex 2013, diabetes mellitus, hypertension, peripheral vascular disease, s/p RBKA 12/2020, anemia, who is being seen 07/07/2023 for the evaluation of afib at the request of Dr. Linwood Espinoza.  History of Present Illness:   Keith Espinoza has extensive coronary artery disease.  He was recently admitted in July 2024 and underwent cardiac catheterization for unstable angina noted to have severe multivessel coronary artery disease with moderate in-stent restenosis of his 3 main vessels.  He was evaluated by CT surgery who felt he would not be a good CABG candidate so plan has been for medical management.  Currently patient is being evaluated for acute CHF exacerbation as well as acute on chronic anemia requiring 1 unit of PRBC transfusion hgh 9.5>7.3 on admission.  No signs of overt bleeding currently.  He has had a recent EGD at [facility] that revealed erythematous gastritis with superficial erosions.  Plan currently is for colonoscopy tomorrow with plans to repeat upper endoscopy if colonoscopy is negative.  Currently GI is holding his Plavix. From a heart failure standpoint it is thought that his symptoms and volume status are improving and has now been transitioned back to p.o. Lasix 80 mg.  Cardiology has been asked to consult due to new onset atrial fibrillation.  He has a echo here that shows mildly reduced EF 40 to 45% with New regional wall motion abnormalities.  Currently patient has no complaints and states that he only has a mild cough.  Seems that his hemoptysis that he had approximately 1  month ago was his primary concern.  He reports no chest pain or any significant cardiac symptoms such as orthopnea, dizziness, palpitations.  He is resting comfortably in bed.  Heart rates are well-controlled less than 100 generally with brief episodes in the 120s.    Potassium 2.8, chloride 90, CO2 34, creatinine 1.4, albumin 2.6, BNP 1200+.  Troponin 19-22.  Chest x-ray indicating low lung volumes with combination of small pleural effusion with overlying atelectasis.  Past Medical History:  Diagnosis Date   Allergic rhinitis    Allergic rhinitis    Arthritis    Basal cell carcinoma 11/01/2019    bcc left chest treatment TX cx3 48fu    Chronic leg pain    right   Chronic lower back pain    Coronary artery disease    a. Stenting to RCA 2004; staged DES to LAD and Cx 2004. DES to [facility] 2012. b. DES to mCx, PTCA to dCx 11/2011. c. Lateral wall MI 2013 s/p PTCA to distal Cx & DES to mid OM2 11/2011. d. Low risk nuc 04/2014, EF wnl.   COVID-19    Diabetes mellitus    Insulin dependent   Diabetic neuropathy (HCC)    MILD   Diverticulosis    Dysrhythmia    [name] syndrome    Gout    right wrist; right foot; right elbow; have had it since 1970's   H/O hiatal hernia    Heart murmur    History of echocardiogram    aortic sclerosis per echo 12/09 EF 65%, otherwise normal

## 2023-07-07 NOTE — Progress Notes (Signed)
I have personally seen and examined this patient and reviewed their chart. I have discussed this patient with the resident. I agree with the assessment and plan as outlined.   Very faint bibasilar crackles on exam, otherwise euvolemic, holding lasix in anticipation of bowel prep. New onset asymptomatic paroxsymal afib, Cardiology consulted. Unfortunately anemia work up precludes anticoagulation at this time. Hemoglobin improved today, prepping for colonoscopy tomorrow.   Ellwood Dense, DO Family Medicine Teaching Service

## 2023-07-07 NOTE — Assessment & Plan Note (Addendum)
Uncertain whether related to patient's recent surgery on his left lower extremity, or a consequence of his CHF exacerbation or both.   -Continue TED hose

## 2023-07-07 NOTE — Inpatient Diabetes Management (Signed)
Inpatient Diabetes Program Recommendations  AACE/ADA: New Consensus Statement on Inpatient Glycemic Control  Target Ranges:  Prepandial:   less than 140 mg/dL      Peak postprandial:   less than 180 mg/dL (1-2 hours)      Critically ill patients:  140 - 180 mg/dL    Review of Glycemic Control  Latest Reference Range & Units 07/06/23 06:09 07/06/23 12:05 07/06/23 15:59 07/06/23 21:06 07/07/23 06:21  Glucose-Capillary 70 - 99 mg/dL 213 (H) 086 (H) 578 (H) 290 (H) 315 (H)   Diabetes history: DM2 Outpatient Diabetes medications: Tresiba 12 units daily, Novolog 2-5 units daily per correction scale, Jardiance 25 mg daily, Metformin 1000 mg BID, Actos 30 mg daily, Dexcom G7 Current orders for Inpatient glycemic control:  Semglee 10 units daily Novolog 0-9 units TID with meals   Inpatient Diabetes Program Recommendations:    -   Novolog 0-5 units at bedtime -   Novolog 4 units TID with meals for meal coverage if patient eats at least 50% of meals.   Outpatient DM medications: Please discontinue Actos as outpatient due to side effect of fluid retention.   Thanks, Christena Deem RN, MSN, BC-ADM Inpatient Diabetes Coordinator Team Pager [phone] (8a-5p)

## 2023-07-07 NOTE — H&P (View-Only) (Signed)
[facility] ROUNDING NOTE   Subjective: No acute events overnight.  Ate a full breakfast this morning without issue.  Was given Dulcolax yesterday, but still no BM in the last 4-5 days.   Objective: Vital signs in last 24 hours: Temp:  [97.6 F (36.4 C)-98.4 F (36.9 C)] 98.1 F (36.7 C) (10/03 0740) Pulse Rate:  [83-176] 98 (10/03 0800) Resp:  [16-21] 18 (10/03 0800) BP: (109-132)/(58-88) 121/59 (10/03 0800) SpO2:  [86 %-98 %] 98 % (10/03 0800) Weight:  [73.2 kg] 73.2 kg (10/03 0623) Last BM Date : 07/04/23 General: NAD Lungs:  CTA b/l, no w/r/r Heart:  RRR, no m/r/g Abdomen:  Soft, NT, ND, +BS    Intake/Output from previous day: 10/02 0701 - 10/03 0700 In: 1160 [P.O.:1160] Out: 3675 [Urine:3675] Intake/Output this shift: No intake/output data recorded.   Lab Results: Recent Labs    07/05/23 0302 07/05/23 1454 07/06/23 0216  WBC 6.0  --  7.1  HGB 9.0* 9.8* 9.6*  PLT 104*  --  126*  MCV 88.6  --  87.4   BMET Recent Labs    07/05/23 0302 07/06/23 0216  NA 133* 139  K 3.6 3.0*  CL 97* 94*  CO2 30 32  GLUCOSE 205* 118*  BUN 31* 29*  CREATININE 1.56* 1.49*  CALCIUM 8.4* 8.6*   LFT No results for input(s): "PROT", "ALBUMIN", "AST", "ALT", "ALKPHOS", "BILITOT", "BILIDIR", "IBILI" in the last 72 hours. PT/INR No results for input(s): "INR" in the last 72 hours.    Imaging/Other results: No results found.    Assessment and Plan:  1) Acute on chronic Anemia Good serologic response to 1 unit PRBC transfusion with repeat Hgb 9.6 yesterday (at baseline).  No repeat labs today.  No overt bleeding, and in fact has not had a BM in the last 4-5 days.  Recent EGD at [facility] on 06/10/2023 with erythematous gastritis with superficial erosion and small hiatal hernia, but otherwise no bleeding. - Plan for colonoscopy tomorrow for diagnostic and potentially therapeutic intent - If colonoscopy negative, will likely plan for repeat upper endoscopy at that  time - Continue serial CBC checks - Clears today with n.p.o. at midnight for procedures tomorrow - Discussed with bedside nurse.  Will start bowel prep at 3 PM - Additional Dulcolax this morning - Received IV iron on this admission  2) CHF exacerbation - Resolving  3) CAD 4) Chronic antiplatelet therapy - Holding Plavix.  Will be 5 days washout tomorrow  The indications, risks, and benefits of EGD and colonoscopy were explained to the patient in detail. Risks include but are not limited to bleeding, perforation, adverse reaction to medications, and cardiopulmonary compromise. Sequelae include but are not limited to the possibility of surgery, hospitalization, and mortality. The patient verbalized understanding and wished to proceed. All questions answered.   Shellia Cleverly, DO  07/07/2023, 10:40 AM [facility] Pager [phone]

## 2023-07-07 NOTE — Progress Notes (Signed)
[facility] ROUNDING NOTE   Subjective: No acute events overnight.  Ate a full breakfast this morning without issue.  Was given Dulcolax yesterday, but still no BM in the last 4-5 days.   Objective: Vital signs in last 24 hours: Temp:  [97.6 F (36.4 C)-98.4 F (36.9 C)] 98.1 F (36.7 C) (10/03 0740) Pulse Rate:  [83-176] 98 (10/03 0800) Resp:  [16-21] 18 (10/03 0800) BP: (109-132)/(58-88) 121/59 (10/03 0800) SpO2:  [86 %-98 %] 98 % (10/03 0800) Weight:  [73.2 kg] 73.2 kg (10/03 0623) Last BM Date : 07/04/23 General: NAD Lungs:  CTA b/l, no w/r/r Heart:  RRR, no m/r/g Abdomen:  Soft, NT, ND, +BS    Intake/Output from previous day: 10/02 0701 - 10/03 0700 In: 1160 [P.O.:1160] Out: 3675 [Urine:3675] Intake/Output this shift: No intake/output data recorded.   Lab Results: Recent Labs    07/05/23 0302 07/05/23 1454 07/06/23 0216  WBC 6.0  --  7.1  HGB 9.0* 9.8* 9.6*  PLT 104*  --  126*  MCV 88.6  --  87.4   BMET Recent Labs    07/05/23 0302 07/06/23 0216  NA 133* 139  K 3.6 3.0*  CL 97* 94*  CO2 30 32  GLUCOSE 205* 118*  BUN 31* 29*  CREATININE 1.56* 1.49*  CALCIUM 8.4* 8.6*   LFT No results for input(s): "PROT", "ALBUMIN", "AST", "ALT", "ALKPHOS", "BILITOT", "BILIDIR", "IBILI" in the last 72 hours. PT/INR No results for input(s): "INR" in the last 72 hours.    Imaging/Other results: No results found.    Assessment and Plan:  1) Acute on chronic Anemia Good serologic response to 1 unit PRBC transfusion with repeat Hgb 9.6 yesterday (at baseline).  No repeat labs today.  No overt bleeding, and in fact has not had a BM in the last 4-5 days.  Recent EGD at [facility] on 06/10/2023 with erythematous gastritis with superficial erosion and small hiatal hernia, but otherwise no bleeding. - Plan for colonoscopy tomorrow for diagnostic and potentially therapeutic intent - If colonoscopy negative, will likely plan for repeat upper endoscopy at that  time - Continue serial CBC checks - Clears today with n.p.o. at midnight for procedures tomorrow - Discussed with bedside nurse.  Will start bowel prep at 3 PM - Additional Dulcolax this morning - Received IV iron on this admission  2) CHF exacerbation - Resolving  3) CAD 4) Chronic antiplatelet therapy - Holding Plavix.  Will be 5 days washout tomorrow  The indications, risks, and benefits of EGD and colonoscopy were explained to the patient in detail. Risks include but are not limited to bleeding, perforation, adverse reaction to medications, and cardiopulmonary compromise. Sequelae include but are not limited to the possibility of surgery, hospitalization, and mortality. The patient verbalized understanding and wished to proceed. All questions answered.   Shellia Cleverly, DO  07/07/2023, 10:40 AM [facility] Pager [phone]

## 2023-07-08 ENCOUNTER — Encounter (HOSPITAL_COMMUNITY): Payer: Self-pay | Admitting: Student

## 2023-07-08 ENCOUNTER — Inpatient Hospital Stay (HOSPITAL_COMMUNITY): Payer: Medicare Other | Admitting: Registered Nurse

## 2023-07-08 ENCOUNTER — Encounter (HOSPITAL_COMMUNITY)
Admission: EM | Discharge status: Against Medical Advice | Payer: Self-pay | Source: Home / Self Care | Attending: Family Medicine

## 2023-07-08 DIAGNOSIS — I471 Supraventricular tachycardia, unspecified: Secondary | ICD-10-CM

## 2023-07-08 DIAGNOSIS — I480 Paroxysmal atrial fibrillation: Secondary | ICD-10-CM | POA: Insufficient documentation

## 2023-07-08 DIAGNOSIS — K31811 Angiodysplasia of stomach and duodenum with bleeding: Secondary | ICD-10-CM

## 2023-07-08 DIAGNOSIS — K5730 Diverticulosis of large intestine without perforation or abscess without bleeding: Secondary | ICD-10-CM

## 2023-07-08 DIAGNOSIS — K31819 Angiodysplasia of stomach and duodenum without bleeding: Secondary | ICD-10-CM

## 2023-07-08 DIAGNOSIS — I5189 Other ill-defined heart diseases: Secondary | ICD-10-CM

## 2023-07-08 DIAGNOSIS — D62 Acute posthemorrhagic anemia: Secondary | ICD-10-CM

## 2023-07-08 DIAGNOSIS — I2510 Atherosclerotic heart disease of native coronary artery without angina pectoris: Secondary | ICD-10-CM

## 2023-07-08 DIAGNOSIS — R609 Edema, unspecified: Secondary | ICD-10-CM

## 2023-07-08 DIAGNOSIS — D509 Iron deficiency anemia, unspecified: Secondary | ICD-10-CM | POA: Diagnosis not present

## 2023-07-08 DIAGNOSIS — I10 Essential (primary) hypertension: Secondary | ICD-10-CM

## 2023-07-08 DIAGNOSIS — I5021 Acute systolic (congestive) heart failure: Secondary | ICD-10-CM | POA: Diagnosis not present

## 2023-07-08 DIAGNOSIS — K2970 Gastritis, unspecified, without bleeding: Secondary | ICD-10-CM

## 2023-07-08 DIAGNOSIS — E7800 Pure hypercholesterolemia, unspecified: Secondary | ICD-10-CM | POA: Diagnosis not present

## 2023-07-08 DIAGNOSIS — K2901 Acute gastritis with bleeding: Secondary | ICD-10-CM

## 2023-07-08 HISTORY — PX: HEMOSTASIS CLIP PLACEMENT: SHX6857

## 2023-07-08 HISTORY — PX: HOT HEMOSTASIS: SHX5433

## 2023-07-08 HISTORY — PX: COLONOSCOPY: SHX5424

## 2023-07-08 HISTORY — PX: ESOPHAGOGASTRODUODENOSCOPY: SHX5428

## 2023-07-08 LAB — BASIC METABOLIC PANEL
Anion gap: 9 (ref 5–15)
BUN: 19 mg/dL (ref 8–23)
CO2: 33 mmol/L — ABNORMAL HIGH (ref 22–32)
Calcium: 8.3 mg/dL — ABNORMAL LOW (ref 8.9–10.3)
Chloride: 92 mmol/L — ABNORMAL LOW (ref 98–111)
Creatinine, Ser: 1.2 mg/dL (ref 0.61–1.24)
GFR, Estimated: >60 mL/min (ref >60)
Glucose, Bld: 166 mg/dL — ABNORMAL HIGH (ref 70–99)
Potassium: 3.5 mmol/L (ref 3.5–5.1)
Sodium: 134 mmol/L — ABNORMAL LOW (ref 135–145)

## 2023-07-08 LAB — CBC
HCT: 33.2 % — ABNORMAL LOW (ref 39.0–52.0)
Hemoglobin: 10.4 g/dL — ABNORMAL LOW (ref 13.0–17.0)
MCH: 27.7 pg (ref 26.0–34.0)
MCHC: 31.3 g/dL (ref 30.0–36.0)
MCV: 88.3 fL (ref 80.0–100.0)
Platelets: 129 10*3/uL — ABNORMAL LOW (ref 150–400)
RBC: 3.76 MIL/uL — ABNORMAL LOW (ref 4.22–5.81)
RDW: 20 % — ABNORMAL HIGH (ref 11.5–15.5)
WBC: 7.1 10*3/uL (ref 4.0–10.5)
nRBC: 0 % (ref 0.0–0.2)

## 2023-07-08 LAB — GLUCOSE, CAPILLARY
Glucose-Capillary: 181 mg/dL — ABNORMAL HIGH (ref 70–99)
Glucose-Capillary: 150 mg/dL — ABNORMAL HIGH (ref 70–99)
Glucose-Capillary: 144 mg/dL — ABNORMAL HIGH (ref 70–99)
Glucose-Capillary: 163 mg/dL — ABNORMAL HIGH (ref 70–99)
Glucose-Capillary: 277 mg/dL — ABNORMAL HIGH (ref 70–99)
Glucose-Capillary: 443 mg/dL — ABNORMAL HIGH (ref 70–99)
Glucose-Capillary: 485 mg/dL — ABNORMAL HIGH (ref 70–99)
Glucose-Capillary: 365 mg/dL — ABNORMAL HIGH (ref 70–99)

## 2023-07-08 LAB — CULTURE, BLOOD (ROUTINE X 2)
Culture: NO GROWTH
Special Requests: ADEQUATE

## 2023-07-08 LAB — MAGNESIUM: Magnesium: 2 mg/dL (ref 1.7–2.4)

## 2023-07-08 SURGERY — COLONOSCOPY
Anesthesia: Monitor Anesthesia Care

## 2023-07-08 MED ORDER — FUROSEMIDE 20 MG PO TABS
20.0000 mg | ORAL_TABLET | Freq: Every day | ORAL | Status: DC
  Administered 2023-07-10: 20 mg via ORAL
  Filled 2023-07-08 (×2): qty 1

## 2023-07-08 MED ORDER — EMPAGLIFLOZIN 25 MG PO TABS
25.0000 mg | ORAL_TABLET | Freq: Every day | ORAL | Status: DC
  Administered 2023-07-08 – 2023-07-10 (×2): 25 mg via ORAL
  Filled 2023-07-08 (×3): qty 1

## 2023-07-08 MED ORDER — INSULIN ASPART 100 UNIT/ML IJ SOLN
0.0000 [IU] | Freq: Three times a day (TID) | INTRAMUSCULAR | Status: DC
  Administered 2023-07-09: 7 [IU] via SUBCUTANEOUS
  Administered 2023-07-09: 3 [IU] via SUBCUTANEOUS

## 2023-07-08 MED ORDER — ISOSORBIDE MONONITRATE ER 60 MG PO TB24
60.0000 mg | ORAL_TABLET | Freq: Every day | ORAL | Status: DC
  Administered 2023-07-08 – 2023-07-10 (×2): 60 mg via ORAL
  Filled 2023-07-08 (×3): qty 1

## 2023-07-08 MED ORDER — PHENYLEPHRINE HCL (PRESSORS) 10 MG/ML IV SOLN
INTRAVENOUS | Status: DC | PRN
Start: 2023-07-08 — End: 2023-07-08
  Administered 2023-07-08: 80 ug via INTRAVENOUS
  Administered 2023-07-08: 160 ug via INTRAVENOUS
  Administered 2023-07-08: 80 ug via INTRAVENOUS
  Administered 2023-07-08 (×3): 160 ug via INTRAVENOUS

## 2023-07-08 MED ORDER — PROPOFOL 10 MG/ML IV BOLUS
INTRAVENOUS | Status: DC | PRN
Start: 2023-07-08 — End: 2023-07-08
  Administered 2023-07-08: 20 mg via INTRAVENOUS
  Administered 2023-07-08: 75 ug/kg/min via INTRAVENOUS
  Administered 2023-07-08: 20 mg via INTRAVENOUS
  Administered 2023-07-08: 50 mg via INTRAVENOUS

## 2023-07-08 MED ORDER — INSULIN ASPART 100 UNIT/ML IJ SOLN
10.0000 [IU] | Freq: Once | INTRAMUSCULAR | Status: AC
  Administered 2023-07-08: 10 [IU] via SUBCUTANEOUS

## 2023-07-08 MED ORDER — LIDOCAINE 2% (20 MG/ML) 5 ML SYRINGE
INTRAMUSCULAR | Status: DC | PRN
  Administered 2023-07-08: 50 mg via INTRAVENOUS

## 2023-07-08 MED ORDER — GLYCOPYRROLATE PF 0.2 MG/ML IJ SOSY
PREFILLED_SYRINGE | INTRAMUSCULAR | Status: DC | PRN
  Administered 2023-07-08: .1 mg via INTRAVENOUS

## 2023-07-08 MED ORDER — LACTATED RINGERS IV SOLN
INTRAVENOUS | Status: AC | PRN
Start: 2023-07-08 — End: 2023-07-08
  Administered 2023-07-08: 1000 mL via INTRAVENOUS

## 2023-07-08 MED ORDER — METOPROLOL SUCCINATE ER 25 MG PO TB24
25.0000 mg | ORAL_TABLET | Freq: Every day | ORAL | Status: DC
  Administered 2023-07-08 – 2023-07-10 (×2): 25 mg via ORAL
  Filled 2023-07-08 (×3): qty 1

## 2023-07-08 NOTE — Progress Notes (Signed)
Daily Progress Note Intern Pager: [phone]  Patient name: Keith Espinoza Medical record number: [id number] Date of birth: 04/15/1941 Age: 82 y.o. Gender: male  Primary Care Provider: Emilio Aspen, MD Consultants: Cardiology, gastroenterology (signed off 10/4) Code Status: DNR DNI   Pt Overview and Major Events to Date:  9/29: Admitted 10/4: Colonoscopy/EGD   Assessment and Plan: Keith Espinoza is an 82 year old male with remote history of STEMI last month in September admitted for acute HFrEF exacerbation, which is since improved.  Now undergoing workup for worsening chronic iron deficiency anemia; colonoscopy and EGD today.   GI workup noted small angioectasia with active bleeding, hemostasis achieved as well as scattered areas of mucosal oozing within the gastric body which were also treated with APC for hemostasis.  Diverticulosis noted, however no sites of bleeding on colonoscopy.  Will discuss plan to restart aspirin and/or Plavix with cardiology given patient's multiple cardiac stents and heart disease.  PMH/PSH includes hypertension, T2DM, CAD SP for stent PCI, acute on chronic anemia. Assessment & Plan CHF exacerbation (HCC) Now euvolemic, transitioned IV to p.o. diuretic 10/2. -Does not appear volume overloaded on exam today, plan to restart Lasix tomorrow (10/5) - Continue Flomax 0.4 mg daily - Strict I's and O's and daily weights. - Compression stocking on left lower extremity Iron deficiency anemia Arrival Hgb 7.2. S/p 1 unit pRBCs. Hgb this AM 10.4. Patient's baseline appears around 9 since July 2024. Patient worked up for hematemesis 3 weeks prior with unremarkable EGD. No new bleeding symptoms at this time.  -Plavix and aspirin held for EGD/colonoscopy this a.m.  Will defer to cardiology on when to restart, likely in 2 to 3 days.   - Cont PO PPI (Protonix) - Transfuse for Hgb <8 - CTM for bleeding symptoms - AM CBC Type 2 diabetes  mellitus with diabetic polyneuropathy, with long-term current use of insulin (HCC) CBGs approaching goal of 140-180, but has been undergoing prep for colonoscopy today. - Continue Semglee 10u for now - CBGs QID - SSI held for n.p.o.  Will add back SSI as needed. Edema of both lower extremities Uncertain whether related to patient's recent surgery on his left lower extremity, or a consequence of his CHF exacerbation or both.   -Continue TED hose Blister Large yellow, fluid-filled bullae on R leg likely secondary to improperly fitting prosthetic noted yesterday.  Unable to examine today due to patient needing to urinate. -Plan for outpatient follow-up with orthopedics at discharge, with Dr. Lajoyce Corners -If patient remains hospitalized on 10/7 will ask Dr. Lajoyce Corners to see him again inpatient -Continue compression and wound care Paroxysmal A-fib [facility]) -Per cardiology will not start DOAC therapy at this time as unclear if this is true A-fib versus atrial tachycardia. - Cardiology recommends outpatient 30-day event monitor to assess for atrial fibrillation - Continue Toprol-XL 25 mg daily Coronary artery disease -Aspirin and Plavix currently held - Per cardiology will restart home dose of Imdur 60 mg daily and Toprol-XL 25 mg daily - Plan to restart either aspirin or Plavix in 2 days; will confer with pharmacy and cardiology to determine if there is a risk difference between these medications as far as further bleeding is concerned Hypokalemia K3.5 this a.m. -Cardiology managing repletion - Goal > 4 Acute on chronic anemia  Hypotension  New onset atrial fibrillation (HCC)  Ischemic cardiomyopathy  Acute systolic CHF (congestive heart failure) (HCC)  Pressure injury of skin  Gastric AVM  Diverticulosis of colon without hemorrhage  Gastrointestinal hemorrhage associated with acute gastritis  PSVT (paroxysmal supraventricular tachycardia) (HCC)    Chronic and Stable Problems:  HLD:  Continue Crestor GERD: Continue Protonix BPH: Continue Flomax, hold home terazosin ISO low BP.   CAD: holding home Plavix and aspirin pending GI/cardiology recommendations     FEN/GI: Per GI full liquid diet at this time following EGD/colonoscopy; anticipate return to carb modified diet PPx: SCDs Dispo: Pending PT recommendations, clinical improvement . Barriers include further inpatient workup and clearance.   Subjective:  Patient seen this morning after EGD and colonoscopy.  He is pleasant, though states he needs to urinate and is somewhat difficult to interview.  Denies any pain.  Is able to tell me about the results of his EGD/colonoscopy and states he is glad "they found something."  He reports he has a fish fry to go to tomorrow to celebrate his birthday and he plans to leave AMA if we do not discharge him.  States "I feel fine."  Objective: Temp:  [97.7 F (36.5 C)-98.1 F (36.7 C)] 97.7 F (36.5 C) (10/04 1015) Pulse Rate:  [73-94] 81 (10/04 1030) Resp:  [12-19] 19 (10/04 1030) BP: (112-126)/(57-80) 121/69 (10/04 1030) SpO2:  [69 %-100 %] 99 % (10/04 1030) Weight:  [71.3 kg] 71.3 kg (10/04 0444) Physical Exam: General: Elderly man, resting in bed, NAD. Cardiovascular: RRR Respiratory: CTA bilaterally, no wheezing.  No increased work of breathing on room air. Abdomen: Soft, nontender, nondistended. Extremities: Left compression stocking in place, no edema.  Right stump with shrinker on.  Unable to examine blister previously noted.  Laboratory: Most recent CBC Lab Results  Component Value Date   WBC 7.1 07/08/2023   HGB 10.4 (L) 07/08/2023   HCT 33.2 (L) 07/08/2023   MCV 88.3 07/08/2023   PLT 129 (L) 07/08/2023   Most recent BMP    Latest Ref Rng & Units 07/08/2023    2:21 AM  BMP  Glucose 70 - 99 mg/dL 161   BUN 8 - 23 mg/dL 19   Creatinine 0.96 - 1.24 mg/dL 0.45   Sodium 409 - 811 mmol/L 134   Potassium 3.5 - 5.1 mmol/L 3.5   Chloride 98 - 111 mmol/L 92   CO2  22 - 32 mmol/L 33   Calcium 8.9 - 10.3 mg/dL 8.3     Magnesium 2.0  Blood cultures remain without any growth at 5 days.  Cyndia Skeeters, DO 07/08/2023, 11:43 AM  PGY-1, [facility] FPTS Intern pager: [phone], text pages welcome Secure chat group [facility] Teaching Service

## 2023-07-08 NOTE — Assessment & Plan Note (Addendum)
Arrival Hgb 7.2. S/p 1 unit pRBCs. Hgb this AM 10.4. Patient's baseline appears around 9 since July 2024. Patient worked up for hematemesis 3 weeks prior with unremarkable EGD. No new bleeding symptoms at this time.  -Plavix and aspirin held for EGD/colonoscopy this a.m.  Will defer to cardiology on when to restart, likely in 2 to 3 days.   - Cont PO PPI (Protonix) - Transfuse for Hgb <8 - CTM for bleeding symptoms - AM CBC

## 2023-07-08 NOTE — Plan of Care (Signed)
  Problem: Education: Goal: Ability to describe self-care measures that may prevent or decrease complications (Diabetes Survival Skills Education) will improve Outcome: Progressing Goal: Individualized Educational Video(s) Outcome: Progressing   Problem: Coping: Goal: Ability to adjust to condition or change in health will improve Outcome: Progressing   Problem: Fluid Volume: Goal: Ability to maintain a balanced intake and output will improve Outcome: Progressing   Problem: Health Behavior/Discharge Planning: Goal: Ability to identify and utilize available resources and services will improve Outcome: Progressing Goal: Ability to manage health-related needs will improve Outcome: Progressing   Problem: Metabolic: Goal: Ability to maintain appropriate glucose levels will improve Outcome: Progressing   Problem: Nutritional: Goal: Maintenance of adequate nutrition will improve Outcome: Progressing Goal: Progress toward achieving an optimal weight will improve Outcome: Progressing   Problem: Skin Integrity: Goal: Risk for impaired skin integrity will decrease Outcome: Progressing   Problem: Tissue Perfusion: Goal: Adequacy of tissue perfusion will improve Outcome: Progressing   Problem: Education: Goal: Knowledge of General Education information will improve Description: Including pain rating scale, medication(s)/side effects and non-pharmacologic comfort measures Outcome: Progressing   Problem: Health Behavior/Discharge Planning: Goal: Ability to manage health-related needs will improve Outcome: Progressing   Problem: Clinical Measurements: Goal: Ability to maintain clinical measurements within normal limits will improve Outcome: Progressing Goal: Will remain free from infection Outcome: Progressing Goal: Diagnostic test results will improve Outcome: Progressing Goal: Respiratory complications will improve Outcome: Progressing Goal: Cardiovascular complication will  be avoided Outcome: Progressing   Problem: Activity: Goal: Risk for activity intolerance will decrease Outcome: Progressing   Problem: Nutrition: Goal: Adequate nutrition will be maintained Outcome: Progressing   Problem: Coping: Goal: Level of anxiety will decrease Outcome: Progressing   Problem: Elimination: Goal: Will not experience complications related to bowel motility Outcome: Progressing Goal: Will not experience complications related to urinary retention Outcome: Progressing   Problem: Pain Managment: Goal: General experience of comfort will improve Outcome: Progressing   Problem: Safety: Goal: Ability to remain free from injury will improve Outcome: Progressing   Problem: Skin Integrity: Goal: Risk for impaired skin integrity will decrease Outcome: Progressing   Problem: Education: Goal: Ability to demonstrate management of disease process will improve Outcome: Progressing Goal: Ability to verbalize understanding of medication therapies will improve Outcome: Progressing Goal: Individualized Educational Video(s) Outcome: Progressing   Problem: Activity: Goal: Capacity to carry out activities will improve Outcome: Progressing   Problem: Cardiac: Goal: Ability to achieve and maintain adequate cardiopulmonary perfusion will improve Outcome: Progressing   

## 2023-07-08 NOTE — Progress Notes (Signed)
Occupational Therapy Treatment Patient Details Name: Keith Espinoza MRN: [id number] DOB: 05-05-1941 Today's Date: 07/08/2023   History of present illness 81yo who presented on 07/03/23 with SOB, CXR concerning for R pleural effusion. Of note did have a L femur fracture and subsequent ORIF in August 2024. Admitted with suspected SOB in setting of acute CHF exacerbation. PMH CAD, MI, IDDM, gilbert syndrome, HTN, HLD, gout, R BKA, cholecystectomy, back surgery   OT comments  This 82 yo male admitted with above seen today to reassess UB strength and address bed level ADLs as well to get a better understanding as to how he normally functions at home and A he has/may need once home. His UEs are 5/5 easily, he is not concerned about lateral transfers with or without transfer board (which he has). He reports the doctor told him he could intermittently put a little weight on his LLE (not sure, but he is going to do this anyway). He continues to report he will have 23 hour care at home (8 hours during the day from paid caregiver and sister is home the other hours--just one hour that one is in house and that's the time in the morning between when sister leaves and caregiver arrives). We will continue to follow pt.      If plan is discharge home, recommend the following:  A little help with bathing/dressing/bathroom;Assist for transportation;Help with stairs or ramp for entrance   Equipment Recommendations  None recommended by OT       Precautions / Restrictions Precautions Precautions: Fall;Other (comment) Precaution Comments: chronic R BKA (with blister), recent L LE femur fracture s/p ORIF now NWB Restrictions Weight Bearing Restrictions: Yes RLE Weight Bearing: Non weight bearing (due to wound on residual limb) LLE Weight Bearing: Non weight bearing       Mobility Bed Mobility Overal bed mobility: Modified Independent Bed Mobility: Sit to Supine           General bed mobility  comments: increased time--no use of bed features    Transfers                   General transfer comment: lateral scoot in bed S     Balance Overall balance assessment: No apparent balance deficits (not formally assessed) (in sitting EOB) Sitting-balance support: No upper extremity supported Sitting balance-Leahy Scale: Good                                     ADL either performed or assessed with clinical judgement   ADL Overall ADL's : Needs assistance/impaired                     Lower Body Dressing: Set up;Supervision/safety Lower Body Dressing Details (indicate cue type and reason): sitting on EOB to start donning underwear>then layin supine and partially bridging then rolling to get underwear up                    Extremity/Trunk Assessment Upper Extremity Assessment Upper Extremity Assessment: Overall WFL for tasks assessed            Vision Baseline Vision/History: 1 Wears glasses Ability to See in Adequate Light: 0 Adequate Patient Visual Report: No change from baseline Vision Assessment?: No apparent visual deficits          Cognition Arousal: Alert Behavior During Therapy: Agitated Overall Cognitive Status: Within Functional Limits  for tasks assessed                                 General Comments: not happy that someone washed his shrinker this morning because it had to air dry and he did not have his other one with him ( but then reported he got bowel on it this morning, so it needed to be washed)                   Pertinent Vitals/ Pain       Pain Assessment Pain Assessment: No/denies pain         Frequency  Min 1X/week        Progress Toward Goals  OT Goals(current goals can now be found in the care plan section)  Progress towards OT goals: Progressing toward goals  Acute Rehab OT Goals Patient Stated Goal: to go home tomorrow OT Goal Formulation: With patient Time For Goal  Achievement: 07/19/23 Potential to Achieve Goals: Good  Plan         AM-PAC OT "6 Clicks" Daily Activity     Outcome Measure   Help from another person eating meals?: None Help from another person taking care of personal grooming?: A Little Help from another person toileting, which includes using toliet, bedpan, or urinal?: A Little Help from another person bathing (including washing, rinsing, drying)?: A Little Help from another person to put on and taking off regular upper body clothing?: A Little Help from another person to put on and taking off regular lower body clothing?: A Little 6 Click Score: 19    End of Session    OT Visit Diagnosis: Other abnormalities of gait and mobility (R26.89)   Activity Tolerance Patient tolerated treatment well   Patient Left in bed;with call bell/phone within reach;with bed alarm set           Time: 1421-1446 OT Time Calculation (min): 25 min  Charges: OT General Charges $OT Visit: 1 Visit OT Treatments $Self Care/Home Management : 23-37 mins  Lindon Romp OT Acute Rehabilitation Services Office [phone]    Evette Georges 07/08/2023, 4:21 PM

## 2023-07-08 NOTE — Anesthesia Postprocedure Evaluation (Addendum)
Anesthesia Post Note  Patient: Keith Espinoza  Procedure(s) Performed: COLONOSCOPY (Left) ESOPHAGOGASTRODUODENOSCOPY (EGD) (Left) HOT HEMOSTASIS (ARGON PLASMA COAGULATION/BICAP) HEMOSTASIS CLIP PLACEMENT     Patient location during evaluation: Endoscopy Anesthesia Type: MAC Level of consciousness: awake and alert Pain management: pain level controlled Vital Signs Assessment: post-procedure vital signs reviewed and stable Respiratory status: spontaneous breathing Cardiovascular status: stable Anesthetic complications: no   There were no known notable events for this encounter.  Last Vitals:  Vitals:   07/08/23 1145 07/08/23 1551  BP: 106/61 98/62  Pulse: 60 68  Resp: 17   Temp: 36.6 C 36.8 C  SpO2: 99% 100%    Last Pain:  Vitals:   07/08/23 1600  TempSrc:   PainSc: 0-No pain                 Lewie Loron

## 2023-07-08 NOTE — Progress Notes (Signed)
Physical Therapy Treatment Patient Details Name: Keith Espinoza MRN: [id number] DOB: 1940/10/07 Today's Date: 07/08/2023   History of Present Illness 81yo who presented on 07/03/23 with SOB, CXR concerning for R pleural effusion. Of note did have a L femur fracture and subsequent ORIF in August 2024. Admitted with suspected SOB in setting of acute CHF exacerbation. PMH CAD, MI, IDDM, gilbert syndrome, HTN, HLD, gout, R BKA, cholecystectomy, back surgery    PT Comments  Pt received in supine and agreeable to session with encouragement. Pt demonstrating increased agitation this session with difficulty redirecting at times. Pt able to perform bed mobility and transfer to and from w/c with supervision, however pt refusing to maintain LLE NWB, stating "the doctor told me I can put a little weight on it sometimes". Pt demonstrating impaired safety awareness and declines assist despite difficulty during transfer, stating "get out of my way" when therapist stepped closer to guard for safety. Pt reporting plan to leave tomorrow and states he does not understand why he is still here. Pt continues to benefit from PT services to progress toward functional mobility goals.    If plan is discharge home, recommend the following: Two people to help with walking and/or transfers;A lot of help with bathing/dressing/bathroom;Assistance with cooking/housework;Direct supervision/assist for medications management;Assist for transportation;A lot of help with walking and/or transfers;Two people to help with bathing/dressing/bathroom;Direct supervision/assist for financial management;Help with stairs or ramp for entrance   Can travel by private vehicle     No  Equipment Recommendations  Other (comment) (defer to next venue)    Recommendations for Other Services       Precautions / Restrictions Precautions Precautions: Fall;Other (comment) Precaution Comments: chronic R BKA (with blister), recent L LE femur  fracture s/p ORIF now NWB Restrictions Weight Bearing Restrictions: Yes LLE Weight Bearing: Non weight bearing     Mobility  Bed Mobility Overal bed mobility: Needs Assistance Bed Mobility: Supine to Sit, Sit to Supine     Supine to sit: Supervision, HOB elevated, Used rails Sit to supine: Supervision, HOB elevated   General bed mobility comments: increased time and use of bed features    Transfers Overall transfer level: Needs assistance Equipment used: None Transfers: Bed to chair/wheelchair/BSC            Lateral/Scoot Transfers: Supervision General transfer comment: Pt declined assist despite difficulty elevating bottom over the gap between w/c and bed. Pt states "get out of my way" when therapist stepped closer to guard for safety. Pt unable to maintain LLE NWB throughout despite cues        Balance Overall balance assessment: Needs assistance Sitting-balance support: No upper extremity supported, Feet supported Sitting balance-Leahy Scale: Good Sitting balance - Comments: sitting EOB                                    Cognition Arousal: Alert Behavior During Therapy: Agitated, Impulsive Overall Cognitive Status: Within Functional Limits for tasks assessed                                 General Comments: Pt agitated and declining assist with mobility, stating "get out of my way". Pt not adhering to WB precaution and becomes argumentative with cues        Exercises      General Comments  Pertinent Vitals/Pain Pain Assessment Pain Assessment: No/denies pain     PT Goals (current goals can now be found in the care plan section) Acute Rehab PT Goals Patient Stated Goal: get out of this room and eat lunch PT Goal Formulation: With patient Time For Goal Achievement: 07/18/23 Progress towards PT goals: Progressing toward goals    Frequency    Min 1X/week       AM-PAC PT "6 Clicks" Mobility   Outcome  Measure  Help needed turning from your back to your side while in a flat bed without using bedrails?: None Help needed moving from lying on your back to sitting on the side of a flat bed without using bedrails?: A Little Help needed moving to and from a bed to a chair (including a wheelchair)?: A Little Help needed standing up from a chair using your arms (e.g., wheelchair or bedside chair)?: Total Help needed to walk in hospital room?: Total Help needed climbing 3-5 steps with a railing? : Total 6 Click Score: 13    End of Session   Activity Tolerance: Patient tolerated treatment well Patient left: in bed;with call bell/phone within reach Nurse Communication: Mobility status PT Visit Diagnosis: Muscle weakness (generalized) (M62.81);Difficulty in walking, not elsewhere classified (R26.2);Other abnormalities of gait and mobility (R26.89)     Time: 9563-8756 PT Time Calculation (min) (ACUTE ONLY): 18 min  Charges:    $Therapeutic Activity: 8-22 mins PT General Charges $$ ACUTE PT VISIT: 1 Visit                     [name], PTA Acute Rehabilitation Services Secure Chat Preferred  [phone]    [name] 07/08/2023, 1:49 PM

## 2023-07-08 NOTE — Assessment & Plan Note (Addendum)
Now euvolemic, transitioned IV to p.o. diuretic 10/2. -Does not appear volume overloaded on exam today, plan to restart Lasix tomorrow (10/5) - Continue Flomax 0.4 mg daily - Strict I's and O's and daily weights. - Compression stocking on left lower extremity

## 2023-07-08 NOTE — Progress Notes (Signed)
Date and time results received: 07/08/23 20:34  Test: CBG Critical Value: 485  Name of Provider Notified: Dr Dolan Amen  Orders Received? Or Actions Taken?: Yes Orders Received - See Orders for details

## 2023-07-08 NOTE — Discharge Instructions (Signed)
Dear Theodora Blow,   Thank you for letting us participate in your care!   You were admitted for an exacerbation of your chronic heart failure.  We treated you with intravenous diuretics (medicine to help get fluid off your body) and transition you to an oral form of this medication.  You were also seen by the gastroenterologist specialist for anemia (low blood counts) and had a colonoscopy.  We gave you IV iron for your deficiency.  POST-HOSPITAL & CARE INSTRUCTIONS Monitor your weight and leg swelling.  Make sure that you take your Lasix (furosemide) every single day. Please let PCP/Specialists know of any changes in medications that were made.  Please see medications section of this packet for any medication changes.   DOCTOR'S APPOINTMENTS & FOLLOW UP Future Appointments  Date Time Provider Department Center  07/20/2023  1:55 PM Ronney Asters, NP [facility] None  10/21/2023  3:00 PM [name], Konrad Dolores, MD [facility] None     Thank you for choosing [facility]! Take care and be well!  Family Medicine Teaching Service Inpatient Team   [facility]  [address] [place], [place] [address] [phone]

## 2023-07-08 NOTE — Assessment & Plan Note (Addendum)
CBGs approaching goal of 140-180, but has been undergoing prep for colonoscopy today. - Continue Semglee 10u for now - CBGs QID - SSI held for n.p.o.  Will add back SSI as needed.

## 2023-07-08 NOTE — Interval H&P Note (Signed)
History and Physical Interval Note:  H/H stable.  No overt bleeding.  Plavix has been held for 5 days.  Plan to proceed with colonoscopy +/- EGD for diagnostic and potentially therapeutic intent.  07/08/2023 9:00 AM  Keith Espinoza  has presented today for surgery, with the diagnosis of Anemia.  The various methods of treatment have been discussed with the patient and family. After consideration of risks, benefits and other options for treatment, the patient has consented to  Procedure(s): COLONOSCOPY (Left) ESOPHAGOGASTRODUODENOSCOPY (EGD) (Left) as a surgical intervention.  The patient's history has been reviewed, patient examined, no change in status, stable for surgery.  I have reviewed the patient's chart and labs.  Questions were answered to the patient's satisfaction.     Verlin Dike Kasi Lasky

## 2023-07-08 NOTE — Progress Notes (Addendum)
Progress Note  Patient Name: Keith Espinoza Date of Encounter: 07/08/2023  [facility] Cardiologist: Lance Muss, MD   Subjective   Just finished endoscopy which showed small angioectasia with active bleeding in the gastric body status post coagulation and hemostatic clip and scattered areas of mucosal oozing in the gastric body distant with gastritis treated with argon plasma coagulation.    Denies any chest pain or shortness of breath wants to go home.  Inpatient Medications    Scheduled Meds:  [MAR Hold] aspirin EC  81 mg Oral Daily   [MAR Hold] gabapentin  400 mg Oral Daily   [MAR Hold] gabapentin  600 mg Oral QHS   [MAR Hold] influenza vaccine adjuvanted  0.5 mL Intramuscular Tomorrow-1000   [MAR Hold] insulin aspart  0-9 Units Subcutaneous TID WC   [MAR Hold] insulin glargine-yfgn  10 Units Subcutaneous Daily   [MAR Hold] pantoprazole  40 mg Oral BID   [MAR Hold] pneumococcal 20-valent conjugate vaccine  0.5 mL Intramuscular Tomorrow-1000   [MAR Hold] potassium chloride  40 mEq Oral BID   [MAR Hold] rosuvastatin  20 mg Oral Daily   [MAR Hold] tamsulosin  0.4 mg Oral QPC supper   Continuous Infusions:  sodium chloride 20 mL/hr at 07/07/23 2139   lactated ringers     PRN Meds: lactated ringers   Vital Signs    Vitals:   07/07/23 2342 07/08/23 0336 07/08/23 0444 07/08/23 0739  BP: 126/69 121/62  123/76  Pulse: 85   73  Resp: 19 12  15   Temp: 98 F (36.7 C) 97.9 F (36.6 C)  97.8 F (36.6 C)  TempSrc: Oral Oral  Temporal  SpO2: 96% 98%  98%  Weight:   71.3 kg   Height:        Intake/Output Summary (Last 24 hours) at 07/08/2023 0835 Last data filed at 07/08/2023 0248 Gross per 24 hour  Intake 749.44 ml  Output 1700 ml  Net -950.56 ml      07/08/2023    4:44 AM 07/07/2023    6:23 AM 07/06/2023    5:00 AM  Last 3 Weights  Weight (lbs) 157 lb 3 oz 161 lb 6 oz 170 lb 13.7 oz  Weight (kg) 71.3 kg 73.2 kg 77.5 kg      Telemetry     Normal sinus rhythm with PACs and PVCs- Personally Reviewed  ECG    Normal sinus rhythm with PACs and PVCs and right bundle branch block with nonspecific ST-T wave abnormality- Personally Reviewed  Physical Exam   GEN: No acute distress.   Neck: No JVD Cardiac: RRR, no murmurs, rubs, or gallops.  Respiratory: Clear to auscultation bilaterally. GI: Soft, nontender, non-distended  MS: No edema; No deformity. Neuro:  Nonfocal  Psych: Normal affect   Labs    High Sensitivity Troponin:   Recent Labs  Lab 07/03/23 1305 07/03/23 1517  TROPONINIHS 19* 22*      Chemistry Recent Labs  Lab 07/03/23 1305 07/04/23 0609 07/05/23 0302 07/07/23 1113 07/07/23 2126 07/08/23 0221  NA 136 134*   < > 135 136 134*  K 3.7 4.0   < > 2.8* 3.6 3.5  CL 99 97*   < > 90* 90* 92*  CO2 26 29   < > 34* 34* 33*  GLUCOSE 154* 219*   < > 293* 138* 166*  BUN 24* 27*   < > 22 21 19   CREATININE 1.36* 1.56*   < > 1.41* 1.23 1.20  CALCIUM 8.6* 8.8*   < > 8.4* 8.6* 8.3*  PROT 5.1* 5.9*  --   --   --   --   ALBUMIN 2.3* 2.6*  --   --   --   --   AST 14* 15  --   --   --   --   ALT 12 14  --   --   --   --   ALKPHOS 72 75  --   --   --   --   BILITOT 0.6 0.8  --   --   --   --   GFRNONAA 52* 44*   < > 49* 58* >60  ANIONGAP 11 8   < > 11 12 9    < > = values in this interval not displayed.     Hematology Recent Labs  Lab 07/06/23 0216 07/07/23 1113 07/08/23 0221  WBC 7.1 5.9 7.1  RBC 3.48* 3.79* 3.76*  HGB 9.6* 10.4* 10.4*  HCT 30.4* 33.4* 33.2*  MCV 87.4 88.1 88.3  MCH 27.6 27.4 27.7  MCHC 31.6 31.1 31.3  RDW 19.9* 19.9* 20.0*  PLT 126* 142* 129*    BNP Recent Labs  Lab 07/03/23 1305  BNP 1,247.5*     DDimer No results for input(s): "DDIMER" in the last 168 hours.   CHA2DS2-VASc Score = 6   This indicates a 9.7% annual risk of stroke. The patient's score is based upon: CHF History: 1 HTN History: 1 Diabetes History: 1 Stroke History: 0 Vascular Disease History: 1 Age  Score: 2 Gender Score: 0      Radiology    No results found.  Patient Profile     82 y.o. male with a hx of coronary artery disease DES to LAD, RCA, left circumflex in 2004, RCA 2012, circumflex 2013, diabetes mellitus, hypertension, peripheral vascular disease, s/p RBKA 12/2020, anemia, who is being seen 07/07/2023 for the evaluation of afib at the request of Dr. Linwood Dibbles.   Assessment & Plan    Acute on chronic anemia requiring 1 PRBC this admission -Had undergone endoscopy at [facility] with gastritis and superficial erosions prior for workup of anemia outpatient.  -Presented back with severe anemia requiring 1 unit PRBC  -Hbg up to 10.4 today -s/p colonoscopy showing diverticulosis -s/p EGD demonstrating single bleeding angiectasia in the gastric body with active bleeding along with gastritis with oozing status post APC and clip of the angioectasia and APC of diffuse gastritis -Per GI patient still at risk for diffuse mucosal bruising from gastritis while on antiplatelet therapy.  And recommended ongoing PPI long-term if he had to stay on antiplatelet therapy. -GI okay to restart Plavix 75 mg daily in 2 days if he still needs to be on it.  New onset paroxysmal atrial fibrillation -Telemetry difficult to assess.  There are areas where it is suspicious for A-fib but intermittently will see a P wave so this could represent paroxysmal atrial tachycardia.  EKG definitely showed normal sinus rhythm with frequent PACs not atrial fibrillation -Given his GI bleed and findings on endoscopy would not start on DOAC therapy at this time since we are unsure if this is truly A-fib and is more suspicious for atrial tachycardia.  I have recommended outpatient 30-day event monitor to assess for any A-fib -Continue Toprol XL 25 mg daily   CAD Recent admission and July 2024 showed severe multivessel disease with ISR of all 3 vessels not deemed candidate for CABG or PCI with plans for medical  management.   -Denies any further chest pain  -Aspirin and Plavix currently on hold due to anemia -Imdur and beta-blocker have been on hold due to soft BP  -BP stable at 123/76 mmHg. -Will restart home dose of Imdur 60 mg daily and Toprol XL 25 mg daily -discussed with GI and they feel plavix is safer than ASA in setting of gastritis and they are ok with restarting monotherapy with Plavix 75mg  daily starting in 2 days  New onset heart failure with mildly reduced EF Echo this admit showed EF of 40 to 45% with mid to distal anterior septal, mid inferior lateral and apical anterior as well as apical inferior hypokinesis which is new from 2 months ago where EF was 60 to 65%.  -Per last cath 04/23/2023 patient is deemed not to be a candidate for CABG or PCI so medical management -Will try to initiate GDMT for reduced EF -Would not restart home amlodipine as would prefer to titrate GDMT to max dose tolerated -He has been on Lasix 80 mg p.o. daily that was held yesterday due to bowel prep -He put out 1.7 L yesterday and is net negative almost 10 L since admission -Weight down 20 pounds since admission -Serum creatinine stable at 1.2 and potassium 3.5 -Replete potassium to keep > 4.  Mag 2 today -Decrease Lasix to home dose of 20 mg daily -Restart Toprol XL 25 mg daily and Jardiance 25 mg daily -If BP remains stable transition home dose of losartan to Entresto 24-26 mg twice daily -Consider addition of spironolactone 12.5 mg daily at discharge or outpatient -Follow strict I's and O's Daily weights and renal function  Hypokalemia -Potassium 3.5 today  -replete to keep > 4  -Mag is 2   Hyperlipidemia -History of statin intolerance as well as Zetia.   -LDL was 87 in July 2024 -Urged patient to remain compliant with meds -Continue rosuvastatin 20 mg.  PCSK9 inhibitor. -repeat FLP and ALT in 6 weeks -Given his extensive CAD, would aim for LDL< 55   Mild to moderate AS -Echo 06/24/2023 mean AV gradient 13  mmHg with DVI 0.33 and AVA 1.25 cm2  -Follow outpatient    Total time spent with patient today 35 minutes. This includes reviewing records, evaluating the patient and coordinating care. Face-to-face time >50%.  For questions or updates, please contact [facility] Please consult [email or web address] for contact info under        Signed, Armanda Magic, MD  07/08/2023, 8:35 AM

## 2023-07-08 NOTE — Assessment & Plan Note (Signed)
K3.5 this a.m. -Cardiology managing repletion - Goal > 4

## 2023-07-08 NOTE — Assessment & Plan Note (Addendum)
Uncertain whether related to patient's recent surgery on his left lower extremity, or a consequence of his CHF exacerbation or both.   -Continue TED hose

## 2023-07-08 NOTE — Progress Notes (Signed)
Patient drank second part of colonoscopy prep.

## 2023-07-08 NOTE — Assessment & Plan Note (Signed)
-  Aspirin and Plavix currently held - Per cardiology will restart home dose of Imdur 60 mg daily and Toprol-XL 25 mg daily - Plan to restart either aspirin or Plavix in 2 days; will confer with pharmacy and cardiology to determine if there is a risk difference between these medications as far as further bleeding is concerned

## 2023-07-08 NOTE — Assessment & Plan Note (Signed)
Patient's pitting edema of L leg likely secondary to post-surgical lymphedema and fluid retention iso acute CHF exacerbation. Per patient, the leg leg appears unchanged from the time of procedure.  - 80 mg Lasix today  - Keep L leg elevated - TED hoe for L leg - AM BMP, CBC

## 2023-07-08 NOTE — Assessment & Plan Note (Signed)
-  Per cardiology will not start DOAC therapy at this time as unclear if this is true A-fib versus atrial tachycardia. - Cardiology recommends outpatient 30-day event monitor to assess for atrial fibrillation - Continue Toprol-XL 25 mg daily

## 2023-07-08 NOTE — Op Note (Signed)
[facility] Patient Name: Keith Espinoza Procedure Date : 07/08/2023 MRN: [id number] Attending MD: Doristine Locks , MD, [id number] Date of Birth: October 07, 1940 CSN: [id number] Age: 82 Admit Type: Inpatient Procedure:                Upper GI endoscopy Indications:              Acute post hemorrhagic anemia Providers:                Doristine Locks, MD, Stephens Shire RN, RN, Jacquelyn                            "Jaci" Clelia Croft, RN, Marja Kays, Technician Referring MD:              Medicines:                Monitored Anesthesia Care Complications:            No immediate complications. Estimated Blood Loss:     Estimated blood loss was minimal. Procedure:                Pre-Anesthesia Assessment:                           - Prior to the procedure, a History and Physical                            was performed, and patient medications and                            allergies were reviewed. The patient's tolerance of                            previous anesthesia was also reviewed. The risks                            and benefits of the procedure and the sedation                            options and risks were discussed with the patient.                            All questions were answered, and informed consent                            was obtained. Prior Anticoagulants: The patient has                            taken Plavix (clopidogrel), last dose was 5 days                            prior to procedure. ASA Grade Assessment: IV - A                            patient with severe systemic disease that is a  constant threat to life. After reviewing the risks                            and benefits, the patient was deemed in                            satisfactory condition to undergo the procedure.                           After obtaining informed consent, the endoscope was                            passed under direct vision. Throughout the                             procedure, the patient's blood pressure, pulse, and                            oxygen saturations were monitored continuously. The                            [id number]-[id number] ([id number] Olympus endoscope was introduced                            through the mouth, and advanced to the third part                            of duodenum. The upper GI endoscopy was                            accomplished without difficulty. The patient                            tolerated the procedure well. Scope In: Scope Out: Findings:      The examined esophagus was normal.      A single small angioectasia with active bleeding was found in the       gastric body. Coagulation for hemostasis using argon plasma was       successful. For additional hemostasis, one hemostatic clip was       successfully placed. Clip manufacturer: [employer]. There was no       bleeding at the end of the procedure.      Scattered mild inflammation characterized by congestion (edema) and       erythema was found in the gastric body. There were scattered areas of       pinpoint mucosal oozing and contact oozing in the gastric body. I       elected to treat these areas with APC as well. Coagulation for       hemostasis using argon plasma was successful. Estimated blood loss was       minimal.      Mild inflammation characterized by congestion (edema) and erythema was       found in the gastric antrum.      The examined duodenum was normal. Impression:               -  Normal esophagus.                           - A single bleeding angioectasia in the stomach.                            Treated with argon plasma coagulation (APC). Clip                            was placed. Clip manufacturer: [employer].                           - Gastritis. Treated with argon plasma coagulation                            (APC).                           - Gastritis.                           - Normal examined  duodenum.                           - No specimens collected.                           While there was the active bleeding gastric AVM                            that was successfully treated endoscopically today,                            he does remain at risk for diffuse gastric mucosal                            bruise from gastritis while on antiplatelet                            therapy. If planning to continue Plavix, would                            recommend continuing PPI long-term as well. Recommendation:           - Return patient to hospital ward for ongoing care.                           - Advance diet as tolerated.                           - Use Protonix (pantoprazole) 40 mg PO BID for 8                            weeks, then reduce to 40 mg daily for ongoing  gastric prophylaxis.                           - Resume Plavix (clopidogrel) at prior dose in 2                            days.                           - Continue serial CBC checks                           - Repeat upper endoscopy PRN for retreatment if                            concern for rebleeding.                           - Inpatient GI service will sign off at this time.                            Please do not hesitate to contact us with                            additional questions or concerns. Procedure Code(s):        --- Professional ---                           [phone], Esophagogastroduodenoscopy, flexible,                            transoral; with control of bleeding, any method Diagnosis Code(s):        --- Professional ---                           K31.811, Angiodysplasia of stomach and duodenum                            with bleeding                           K29.70, Gastritis, unspecified, without bleeding                           D62, Acute posthemorrhagic anemia CPT copyright 2022 [employer]. All rights reserved. The codes documented in  this report are preliminary and upon coder review may  be revised to meet current compliance requirements. Doristine Locks, MD 07/08/2023 10:32:26 AM Number of Addenda: 0

## 2023-07-08 NOTE — Assessment & Plan Note (Signed)
Generalized edema since hospital discharge 3 weeks ago in addition to orthopnea.  Echo 04/24/2023 with EF 60 to 65% and grade 1 diastolic dysfunction. Suspect SOB in setting of acute CHF exacerbation. BNP 1247.5. Trops trended flat.  - admitted to [facility]MT[facility], progressive, Dr. Deirdre Priest attending - S/p lasix 40mg  in the ED, will monitor output before additional doses - hold pioglitazone - repeat echo - strict I&Os - daily weights - orthostatics - am BMP, Mag - PT/OT

## 2023-07-08 NOTE — Anesthesia Preprocedure Evaluation (Addendum)
Anesthesia Evaluation  Patient identified by MRN, date of birth, ID band Patient awake    Reviewed: Allergy & Precautions, NPO status , Patient's Chart, lab work & pertinent test results  Airway Mallampati: III  TM Distance: >3 FB Neck ROM: Full    Dental  (+) Edentulous Upper, Edentulous Lower   Pulmonary shortness of breath, former smoker   Pulmonary exam normal breath sounds clear to auscultation       Cardiovascular hypertension, Pt. on medications and Pt. on home beta blockers + angina  + CAD, + Past MI, + Cardiac Stents (2004, 2012, 2013- last took plavix yesterday ), + Peripheral Vascular Disease and +CHF  Normal cardiovascular exam+ dysrhythmias + Valvular Problems/Murmurs AS  Rhythm:Regular Rate:Normal  Echo 07/04/2023  1. Left ventricular ejection fraction, by estimation, is 40 to 45%. The left ventricle has mildly decreased function. The left ventricle demonstrates regional wall motion abnormalities (see scoring diagram/findings for description). There is mild left ventricular hypertrophy. Left ventricular diastolic parameters are consistent with Grade I diastolic dysfunction (impaired relaxation).   2. Right ventricular systolic function is normal. The right ventricular size is normal.   3. Left atrial size was mildly dilated.   4. The mitral valve is normal in structure. No evidence of mitral valve regurgitation. No evidence of mitral stenosis.   5. The right coronary and left coronary cusps are fused. The aortic valve is tricuspid. There is moderate calcification of the aortic valve. Aortic valve regurgitation is trivial. Mild to moderate aortic valve stenosis. Aortic valve area, by VTI measures 1.25 cm. Aortic valve mean gradient measures 13.3 mmHg. Aortic valve Vmax measures 2.34 m/s.   6. The inferior vena cava is normal in size with greater than 50% respiratory variability, suggesting right atrial pressure of 3 mmHg.    Conclusion(s)/Recommendation(s): EF down from previous echo with new wall  motion abnormalities.    Left heart catheterization 04/25/2023     Mid LAD-1 lesion is 75% stenosed with 70% stenosed side branch in 1st Diag. - just prior to stent.  Mid LAD-2 STENT is 40% stenosed.  Mid LAD-3 lesion is 75% stenosed with 60% stenosed side branch in 2nd Diag at distal edge of STENT   1st Diag lesion is 90% stenosed.   Prox Cx to Mid Cx STENT segment is 45% stenosed with 90% stenosed side branch in 4th Mrg.  Mid Cx to Dist Cx lesion is 95% stenosed just after stent   Dist Cx-1 lesion is 95% stenosed prior to stent. Previously placed Dist Cx-2 DES STENT is widely patent.   Prox RCA-1 lesion is 70% stenosed.  Prox RCA-2 lesion is 85% stenosed. Prox RCA-3 lesion is 80% stenosed. Prox RCA to Mid RCA lesion is 95% stenosed.   Mid RCA STENT is 35% stenosed.   RPAV lesion is 60% stenosed.   RPDA lesion is 80% stenosed.   There is no aortic valve stenosis.    Severe MV CAD - with moderate ISR in LAD/RCA & LCx but each stent with stent edge lesions.  Best option is CABG --      Echo 04/2023  1. Left ventricular ejection fraction, by estimation, is 60 to 65%. The left ventricle has normal function. The left ventricle has no regional wall motion abnormalities. There is mild left ventricular hypertrophy. Left ventricular diastolic parameters are consistent with Grade I diastolic dysfunction (impaired relaxation). Elevated left atrial pressure.   2. Right ventricular systolic function is normal. The right ventricular size is normal.  3. Left atrial size was mildly dilated.   4. Trivial mitral valve regurgitation.   5. AV is thickened, calcified ? functionally bicuspid with fusion of L and noncoronary cusps. Peak and mean gradients through the valve are 26 and 15 mm Hg respectively. AVA (VTI ) is 1.16 cm2. Dimensionless index is 0.44. Overall ocnsistent with mild to moderate AS. Marland Kitchen Aortic valve  regurgitation is not visualized.       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)  negative psych ROS   GI/Hepatic hiatal hernia,GERD  Controlled,,Gilbert syn   Endo/Other  diabetes, Poorly Controlled, Type 2, Insulin Dependent, Oral Hypoglycemic Agents  a1c 7.7  Renal/GU Renal disease     Musculoskeletal  (+) Arthritis , Osteoarthritis,  Abscess R BKA Takes oxy once-twice per day   Abdominal   Peds  Hematology  (+) Blood dyscrasia, anemia   Anesthesia Other Findings   Reproductive/Obstetrics                             Anesthesia Physical Anesthesia Plan  ASA: 4  Anesthesia Plan: MAC   Post-op Pain Management:  Regional for Post-op pain and Minimal or no pain anticipated   Induction: Intravenous  PONV Risk Score and Plan: 1 and Propofol infusion, TIVA and Treatment may vary due to age or medical condition  Airway Management Planned: Natural Airway and Simple Face Mask  Additional Equipment: None  Intra-op Plan:   Post-operative Plan:   Informed Consent: I have reviewed the patients History and Physical, chart, labs and discussed the procedure including the risks, benefits and alternatives for the proposed anesthesia with the patient or authorized representative who has indicated his/her understanding and acceptance.   Patient has DNR.  Discussed DNR with patient and Suspend DNR.   Dental advisory given  Plan Discussed with: CRNA  Anesthesia Plan Comments:         Anesthesia Quick Evaluation

## 2023-07-08 NOTE — Assessment & Plan Note (Signed)
>>  ASSESSMENT AND PLAN FOR EDEMA WRITTEN ON 07/08/2023  3:07 PM BY Ivery Quale, MD  Patient's pitting edema of L leg likely secondary to post-surgical lymphedema and fluid retention iso acute CHF exacerbation. Per patient, the leg leg appears unchanged from the time of procedure.  - 80 mg Lasix today  - Keep L leg elevated - TED hoe for L leg - AM BMP, CBC

## 2023-07-08 NOTE — Assessment & Plan Note (Addendum)
Large yellow, fluid-filled bullae on R leg likely secondary to improperly fitting prosthetic noted yesterday.  Unable to examine today due to patient needing to urinate. -Plan for outpatient follow-up with orthopedics at discharge, with Dr. Lajoyce Corners -If patient remains hospitalized on 10/7 will ask Dr. Lajoyce Corners to see him again inpatient -Continue compression and wound care

## 2023-07-08 NOTE — Assessment & Plan Note (Signed)
On amlodipine 10mg  every day at home.  - holding amlodipine 10mg  and terazosin 5mg  at bedtime (for BPH) due to soft pressures in the ED - continue to monitor VS

## 2023-07-08 NOTE — Assessment & Plan Note (Signed)
>>  ASSESSMENT AND PLAN FOR EDEMA WRITTEN ON 07/08/2023  3:07 PM BY MAXWELL, ALLEE, MD  Generalized edema since hospital discharge 3 weeks ago in addition to orthopnea.  Echo 04/24/2023 with EF 60 to 65% and grade 1 diastolic dysfunction. Suspect SOB in setting of acute CHF exacerbation. BNP 1247.5. Trops trended flat.  - admitted to FMTS, progressive, Dr. Deirdre Priest attending - S/p lasix 40mg  in the ED, will monitor output before additional doses - hold pioglitazone - repeat echo - strict I&Os - daily weights - orthostatics - am BMP, Mag - PT/OT

## 2023-07-08 NOTE — Transfer of Care (Signed)
Immediate Anesthesia Transfer of Care Note  Patient: Garner Dullea  Procedure(s) Performed: COLONOSCOPY (Left) ESOPHAGOGASTRODUODENOSCOPY (EGD) (Left) HOT HEMOSTASIS (ARGON PLASMA COAGULATION/BICAP) HEMOSTASIS CLIP PLACEMENT  Patient Location: PACU  Anesthesia Type:MAC  Level of Consciousness: awake, alert , and oriented  Airway & Oxygen Therapy: Patient Spontanous Breathing  Post-op Assessment: Report given to RN and Post -op Vital signs reviewed and stable  Post vital signs: Reviewed and stable  Last Vitals:  Vitals Value Taken Time  BP 112/57 07/08/23 1015  Temp    Pulse 82 07/08/23 1018  Resp 15 07/08/23 1018  SpO2 99 % 07/08/23 1018  Vitals shown include unfiled device data.  Last Pain:  Vitals:   07/08/23 0739  TempSrc: Temporal  PainSc: 0-No pain         Complications: There were no known notable events for this encounter.

## 2023-07-08 NOTE — Op Note (Signed)
[facility] Patient Name: Keith Espinoza Procedure Date : 07/08/2023 MRN: [id number] Attending MD: Doristine Locks , MD, [id number] Date of Birth: Jun 25, 1941 CSN: [id number] Age: 82 Admit Type: Inpatient Procedure:                Colonoscopy Indications:              Acute on chronic anemia Providers:                Doristine Locks, MD, Stephens Shire RN, RN, Jacquelyn                            "Jaci" Clelia Croft, RN, Marja Kays, Technician Referring MD:              Medicines:                Monitored Anesthesia Care Complications:            No immediate complications. Estimated Blood Loss:     Estimated blood loss: none. Procedure:                Pre-Anesthesia Assessment:                           - Prior to the procedure, a History and Physical                            was performed, and patient medications and                            allergies were reviewed. The patient's tolerance of                            previous anesthesia was also reviewed. The risks                            and benefits of the procedure and the sedation                            options and risks were discussed with the patient.                            All questions were answered, and informed consent                            was obtained. Prior Anticoagulants: The patient has                            taken Plavix (clopidogrel), last dose was 5 days                            prior to procedure. ASA Grade Assessment: IV - A                            patient with severe systemic disease that is a  constant threat to life. After reviewing the risks                            and benefits, the patient was deemed in                            satisfactory condition to undergo the procedure.                           After obtaining informed consent, the colonoscope                            was passed under direct vision. Throughout the                             procedure, the patient's blood pressure, pulse, and                            oxygen saturations were monitored continuously. The                            CF-HQ190L ([id number]) Olympus coloscope was                            introduced through the anus and advanced to the the                            terminal ileum. The colonoscopy was performed                            without difficulty. The patient tolerated the                            procedure well. The quality of the bowel                            preparation was good. The terminal ileum, ileocecal                            valve, appendiceal orifice, and rectum were                            photographed. Scope In: 9:31:48 AM Scope Out: 9:46:09 AM Scope Withdrawal Time: 0 hours 6 minutes 12 seconds  Total Procedure Duration: 0 hours 14 minutes 21 seconds  Findings:      The perianal and digital rectal examinations were normal.      Multiple large-mouthed and small-mouthed diverticula were found in the       sigmoid colon, descending colon and ascending colon. Lavage of the area       was performed using sterile water, resulting in clearance with good       visualization.      The ileocecal valve was moderately lipomatous.      Normal mucosa was found in the entire colon. No areas of mucosal  erythema, edema, erosions, ulceration. No blood in the lower GI lumen.      The retroflexed view of the distal rectum and anal verge was normal and       showed no anal or rectal abnormalities.      The terminal ileum appeared normal. Impression:               - Diverticulosis in the sigmoid colon, in the                            descending colon and in the ascending colon.                           - Lipomatous ileocecal valve.                           - Normal mucosa in the entire examined colon.                           - The distal rectum and anal verge are normal on                            retroflexion  view.                           - The examined portion of the ileum was normal.                           - No specimens collected. Recommendation:           - Perform an upper GI endoscopy today.                           - No repeat colonoscopy for screening purposes                            recommended based on age and comorbidities.                           - Please see upper endoscopy report for additional                            details regarding ongoing inpatient management. Procedure Code(s):        --- Professional ---                           [phone], Colonoscopy, flexible; diagnostic, including                            collection of specimen(s) by brushing or washing,                            when performed (separate procedure) Diagnosis Code(s):        --- Professional ---                           D62,  Acute posthemorrhagic anemia                           K57.30, Diverticulosis of large intestine without                            perforation or abscess without bleeding CPT copyright 2022 [employer]. All rights reserved. The codes documented in this report are preliminary and upon coder review may  be revised to meet current compliance requirements. Doristine Locks, MD 07/08/2023 10:24:24 AM Number of Addenda: 0

## 2023-07-09 ENCOUNTER — Other Ambulatory Visit (HOSPITAL_COMMUNITY): Payer: Self-pay

## 2023-07-09 DIAGNOSIS — I5043 Acute on chronic combined systolic (congestive) and diastolic (congestive) heart failure: Secondary | ICD-10-CM | POA: Diagnosis not present

## 2023-07-09 LAB — CBC WITH DIFFERENTIAL/PLATELET
Abs Immature Granulocytes: 0.04 10*3/uL (ref 0.00–0.07)
Basophils Absolute: 0 10*3/uL (ref 0.0–0.1)
Basophils Relative: 1 %
Eosinophils Absolute: 0.3 10*3/uL (ref 0.0–0.5)
Eosinophils Relative: 4 %
HCT: 31.4 % — ABNORMAL LOW (ref 39.0–52.0)
Hemoglobin: 9.7 g/dL — ABNORMAL LOW (ref 13.0–17.0)
Immature Granulocytes: 1 %
Lymphocytes Relative: 15 %
Lymphs Abs: 1.3 10*3/uL (ref 0.7–4.0)
MCH: 27.7 pg (ref 26.0–34.0)
MCHC: 30.9 g/dL (ref 30.0–36.0)
MCV: 89.7 fL (ref 80.0–100.0)
Monocytes Absolute: 0.8 10*3/uL (ref 0.1–1.0)
Monocytes Relative: 9 %
Neutro Abs: 6.2 10*3/uL (ref 1.7–7.7)
Neutrophils Relative %: 70 %
Platelets: 141 10*3/uL — ABNORMAL LOW (ref 150–400)
RBC: 3.5 MIL/uL — ABNORMAL LOW (ref 4.22–5.81)
RDW: 20.5 % — ABNORMAL HIGH (ref 11.5–15.5)
WBC: 8.6 10*3/uL (ref 4.0–10.5)
nRBC: 0 % (ref 0.0–0.2)

## 2023-07-09 LAB — BASIC METABOLIC PANEL
Anion gap: 9 (ref 5–15)
Anion gap: 11 (ref 5–15)
BUN: 19 mg/dL (ref 8–23)
BUN: 16 mg/dL (ref 8–23)
CO2: 30 mmol/L (ref 22–32)
CO2: 27 mmol/L (ref 22–32)
Calcium: 8.9 mg/dL (ref 8.9–10.3)
Calcium: 8.9 mg/dL (ref 8.9–10.3)
Chloride: 96 mmol/L — ABNORMAL LOW (ref 98–111)
Chloride: 96 mmol/L — ABNORMAL LOW (ref 98–111)
Creatinine, Ser: 1.12 mg/dL (ref 0.61–1.24)
Creatinine, Ser: 1.26 mg/dL — ABNORMAL HIGH (ref 0.61–1.24)
GFR, Estimated: >60 mL/min (ref >60)
GFR, Estimated: 57 mL/min — ABNORMAL LOW (ref >60)
Glucose, Bld: 235 mg/dL — ABNORMAL HIGH (ref 70–99)
Glucose, Bld: 390 mg/dL — ABNORMAL HIGH (ref 70–99)
Potassium: 4.1 mmol/L (ref 3.5–5.1)
Potassium: 4.1 mmol/L (ref 3.5–5.1)
Sodium: 135 mmol/L (ref 135–145)
Sodium: 134 mmol/L — ABNORMAL LOW (ref 135–145)

## 2023-07-09 LAB — GLUCOSE, CAPILLARY
Glucose-Capillary: 205 mg/dL — ABNORMAL HIGH (ref 70–99)
Glucose-Capillary: 264 mg/dL — ABNORMAL HIGH (ref 70–99)
Glucose-Capillary: 342 mg/dL — ABNORMAL HIGH (ref 70–99)
Glucose-Capillary: 404 mg/dL — ABNORMAL HIGH (ref 70–99)

## 2023-07-09 MED ORDER — METOPROLOL SUCCINATE ER 25 MG PO TB24
25.0000 mg | ORAL_TABLET | Freq: Every day | ORAL | 0 refills | Status: DC
  Filled 2023-07-09: qty 30, 30d supply, fill #0

## 2023-07-09 MED ORDER — SACUBITRIL-VALSARTAN 24-26 MG PO TABS
1.0000 | ORAL_TABLET | Freq: Two times a day (BID) | ORAL | 0 refills | Status: DC
  Filled 2023-07-09: qty 60, 30d supply, fill #0

## 2023-07-09 MED ORDER — BASAGLAR KWIKPEN 100 UNIT/ML ~~LOC~~ SOPN
12.0000 [IU] | PEN_INJECTOR | Freq: Every day | SUBCUTANEOUS | 11 refills | Status: DC
Start: 2023-07-09 — End: 2023-12-30
  Filled 2023-07-09: qty 9, 75d supply, fill #0

## 2023-07-09 MED ORDER — FUROSEMIDE 20 MG PO TABS
20.0000 mg | ORAL_TABLET | Freq: Every day | ORAL | 0 refills | Status: DC
  Filled 2023-07-09: qty 30, 30d supply, fill #0

## 2023-07-09 MED ORDER — INSULIN GLARGINE-YFGN 100 UNIT/ML ~~LOC~~ SOLN
12.0000 [IU] | Freq: Every day | SUBCUTANEOUS | Status: DC
  Administered 2023-07-10: 12 [IU] via SUBCUTANEOUS
  Filled 2023-07-09 (×2): qty 0.12

## 2023-07-09 MED ORDER — INSULIN ASPART 100 UNIT/ML IJ SOLN
0.0000 [IU] | Freq: Three times a day (TID) | INTRAMUSCULAR | Status: DC
  Administered 2023-07-09: 9 [IU] via SUBCUTANEOUS
  Administered 2023-07-10: 3 [IU] via SUBCUTANEOUS

## 2023-07-09 MED ORDER — SACUBITRIL-VALSARTAN 24-26 MG PO TABS
1.0000 | ORAL_TABLET | Freq: Two times a day (BID) | ORAL | Status: DC
  Administered 2023-07-09 – 2023-07-10 (×3): 1 via ORAL
  Filled 2023-07-09 (×3): qty 1

## 2023-07-09 MED ORDER — TAMSULOSIN HCL 0.4 MG PO CAPS
0.4000 mg | ORAL_CAPSULE | Freq: Every day | ORAL | 0 refills | Status: DC
  Filled 2023-07-09: qty 30, 30d supply, fill #0

## 2023-07-09 MED ORDER — INSULIN ASPART 100 UNIT/ML IJ SOLN: 0.0000 [IU] | Freq: Three times a day (TID) | INTRAMUSCULAR | Status: DC

## 2023-07-09 MED ORDER — PANTOPRAZOLE SODIUM 40 MG PO TBEC
40.0000 mg | DELAYED_RELEASE_TABLET | Freq: Two times a day (BID) | ORAL | 0 refills | Status: DC
  Filled 2023-07-09: qty 60, 30d supply, fill #0

## 2023-07-09 MED ORDER — ISOSORBIDE MONONITRATE ER 60 MG PO TB24: 60.0000 mg | ORAL_TABLET | Freq: Every day | ORAL | Status: DC

## 2023-07-09 NOTE — Assessment & Plan Note (Addendum)
Elevated CBGs up to 485 overnight.  Given 10 units of NovoLog with next CBG 365.  Suspect he will have better control now that sliding scale has been restarted and he has been transition back to carb modified diet. Fasting CBG 205. - Increase Semglee to 12u daily (home dosing) - Restart sensitive SSI - CBGs 4 times daily, with meals and bedtime

## 2023-07-09 NOTE — Progress Notes (Signed)
Progress Note  Patient Name: Keith Espinoza Date of Encounter: 07/09/2023  Primary Cardiologist:   Lance Muss, MD   Subjective   No chest pain.  Breathing better.  Wants to go home.   Inpatient Medications    Scheduled Meds:  empagliflozin  25 mg Oral Daily   furosemide  20 mg Oral Daily   gabapentin  400 mg Oral Daily   gabapentin  600 mg Oral QHS   influenza vaccine adjuvanted  0.5 mL Intramuscular Tomorrow-1000   insulin aspart  0-9 Units Subcutaneous TID WC   insulin glargine-yfgn  12 Units Subcutaneous Daily   isosorbide mononitrate  60 mg Oral Daily   metoprolol succinate  25 mg Oral Daily   pantoprazole  40 mg Oral BID   pneumococcal 20-valent conjugate vaccine  0.5 mL Intramuscular Tomorrow-1000   rosuvastatin  20 mg Oral Daily   sacubitril-valsartan  1 tablet Oral BID   tamsulosin  0.4 mg Oral QPC supper   Continuous Infusions:  PRN Meds:    Vital Signs    Vitals:   07/08/23 2351 07/09/23 0254 07/09/23 0315 07/09/23 0830  BP: (!) 102/57 109/67  127/76  Pulse: 73   71  Resp: (!) 23 20  15   Temp: 98.3 F (36.8 C) 98.6 F (37 C)  98 F (36.7 C)  TempSrc: Oral Oral  Oral  SpO2: 100% 97%    Weight:   73.9 kg   Height:        Intake/Output Summary (Last 24 hours) at 07/09/2023 1102 Last data filed at 07/09/2023 0317 Gross per 24 hour  Intake 0 ml  Output 1625 ml  Net -1625 ml   Filed Weights   07/07/23 0623 07/08/23 0444 07/09/23 0315  Weight: 73.2 kg 71.3 kg 73.9 kg    Telemetry    NSR - Personally Reviewed  ECG    NA - Personally Reviewed  Physical Exam   GEN: No acute distress.   Neck: No  JVD Cardiac: RRR, 3/6 apical systolic murmur, no diastolic murmurs, rubs, or gallops.  Respiratory:   Bilateral basilar crackles GI: Soft, nontender, non-distended  MS: No  edema; No deformity. Neuro:  Nonfocal  Psych: Normal affect   Labs    Chemistry Recent Labs  Lab 07/03/23 1305 07/04/23 0609 07/05/23 0302  07/07/23 2126 07/08/23 0221 07/09/23 0225  NA 136 134*   < > 136 134* 135  K 3.7 4.0   < > 3.6 3.5 4.1  CL 99 97*   < > 90* 92* 96*  CO2 26 29   < > 34* 33* 30  GLUCOSE 154* 219*   < > 138* 166* 235*  BUN 24* 27*   < > 21 19 19   CREATININE 1.36* 1.56*   < > 1.23 1.20 1.12  CALCIUM 8.6* 8.8*   < > 8.6* 8.3* 8.9  PROT 5.1* 5.9*  --   --   --   --   ALBUMIN 2.3* 2.6*  --   --   --   --   AST 14* 15  --   --   --   --   ALT 12 14  --   --   --   --   ALKPHOS 72 75  --   --   --   --   BILITOT 0.6 0.8  --   --   --   --   GFRNONAA 52* 44*   < > 58* >60 >60  ANIONGAP 11 8   < > 12 9 9    < > = values in this interval not displayed.     Hematology Recent Labs  Lab 07/07/23 1113 07/08/23 0221 07/09/23 0225  WBC 5.9 7.1 8.6  RBC 3.79* 3.76* 3.50*  HGB 10.4* 10.4* 9.7*  HCT 33.4* 33.2* 31.4*  MCV 88.1 88.3 89.7  MCH 27.4 27.7 27.7  MCHC 31.1 31.3 30.9  RDW 19.9* 20.0* 20.5*  PLT 142* 129* 141*    Cardiac EnzymesNo results for input(s): "TROPONINI" in the last 168 hours. No results for input(s): "TROPIPOC" in the last 168 hours.   BNP Recent Labs  Lab 07/03/23 1305  BNP 1,247.5*     DDimer No results for input(s): "DDIMER" in the last 168 hours.   Radiology    No results found.  Cardiac Studies   See echo above.   Patient Profile     82 y.o. male with a hx of coronary artery disease DES to LAD, RCA, left circumflex in 2004, RCA 2012, circumflex 2013, diabetes mellitus, hypertension, peripheral vascular disease, s/p RBKA 12/2020, anemia, who is being seen 07/07/2023 for the evaluation of afib at the request of Dr. Linwood Dibbles.   Assessment & Plan    Atrial arrhythmia:    MAT vs short runs of fib.  No DOAC with bleeding this admission.  Plan is for a 30 day out patient monitor.  Continue low dose beta blocker.    Acute on chronic HFmrEF   :  Net negative 11 liters this admission.  Lasix was reduced.   Meds as on MAR with Jardiance restarted. Sherryll Burger has been started.   I would hold off on the addition of spironolactone.       CAD: Restarted on low dose Imdur and beta blocker.  Also now on Plavix alone with the OK from GI.  Medical management of disease.    He has follow up 10/16  in our [place] office.    For questions or updates, please contact [facility] Please consult [email or web address] for contact info under Cardiology/STEMI.   Signed, Rollene Rotunda, MD  07/09/2023, 11:02 AM

## 2023-07-09 NOTE — Progress Notes (Signed)
Teaching service reached out awaiting DC recs. Awaiting MD input from our team which provider will see. Tentatively arranged post hosp f/u - no availability presently at [facility] during next 2 weeks so arranged @ [facility] 07/20/23 and added FYI regarding location to AVS. I also asked teaching service to relay to patient the difference in location.

## 2023-07-09 NOTE — Assessment & Plan Note (Addendum)
S/p 1 unit pRBCs.  Hemoglobin 9.7.  Normal colonoscopy.  EGD revealed single bleeding angiectasia and gastritis which was treated endoscopically.  PPI is recommended long-term should he continue taking Plavix.  Recommendation by GI is Protonix 40 mg twice daily x 8 weeks then 40 mg daily. - Cont Protonix 40 mg twice daily - Transfuse for Hgb <8 - AM CBC

## 2023-07-09 NOTE — Progress Notes (Addendum)
Patient refusing all medications and demanding to leave AMA.  Went to the bedside with Dr. Mliss Sax.  Patient had been Hospital doctor.  He was yelling profanities.  He says that some doctor came in this morning and told him he could leave by 1:30 PM.  I advised him that this was not any of our team members, but that we could give him his Sherryll Burger, check his labs and monitor his blood pressure and he could discharge later this evening.  I advised him of the risk of leaving AGAINST MEDICAL ADVICE including worsening heart failure, further decline, death.  Patient continued to yell obscenities, and I offered him the AMA form which he refused to sign, but still demanding to leave the hopsital.  His daughter is on his way to pick him up. RN will discuss with charge nurse regarding refusal to sign AMA documentation.  Discharge order placed AGAINST MEDICAL ADVICE. Follow-up with cardiology scheduled and included in discharge paperwork.   Darral Dash, DO PGY 3 [facility]   Addendum: Patient's daughter arrived and patient is now willing to stay, take his Sherryll Burger, get a BMP at 1700 likely discharge at 7 PM pending renal function and blood pressure control are appropriate.

## 2023-07-09 NOTE — Assessment & Plan Note (Signed)
Echo with EF 40 to 45% representing new onset HFrEF.  Euvolemic at this time.  Will attempt to titrate GDMT to max dose tolerated prior to discharge.  Consideration for starting Entresto or spironolactone next. - Cardiology following, appreciate recommendations - Lasix 20 mg daily, Jardiance 25 mg daily -Imdur 60 mg daily - Continue Flomax 0.4 mg daily - Strict I's and O's and daily weights. - Compression stocking on left lower extremity

## 2023-07-09 NOTE — Assessment & Plan Note (Addendum)
Euvolemic on exam. -Continue TED hose

## 2023-07-09 NOTE — Progress Notes (Addendum)
Daily Progress Note Intern Pager: [phone]  Patient name: Keith Espinoza Medical record number: [id number] Date of birth: [birth year] Age: 82 y.o. Gender: male  Primary Care Provider: Emilio Aspen, MD Consultants: Cardiology, GI (signed off) Code Status: DNR-limited  Pt Overview and Major Events to Date:  9/29: Admitted 10/4: EGD, colonoscopy  Assessment and Plan: Keith Espinoza is a 82 y.o. male presenting with shortness of breath generalized edema notable for CHF exacerbation however he is now receiving workup for smoldering anemia with positive GI workup for small angiectasia with active bleeding treated endoscopically. Pertinent PMH/PSH includes T2DM, CAD, HTN, STEMI.  Cardiology remains available given patient's history of multiple cardiac stents and heart disease. Assessment & Plan CHF exacerbation (HCC) Echo with EF 40 to 45% representing new onset HFrEF.  Euvolemic at this time.  Will attempt to titrate GDMT to max dose tolerated prior to discharge.  Consideration for starting Entresto or spironolactone next. - Cardiology following, appreciate recommendations - Lasix 20 mg daily, Jardiance 25 mg daily -Imdur 60 mg daily - Continue Flomax 0.4 mg daily - Strict I's and O's and daily weights. - Compression stocking on left lower extremity Acute on chronic anemia S/p 1 unit pRBCs.  Hemoglobin 9.7.  Normal colonoscopy.  EGD revealed single bleeding angiectasia and gastritis which was treated endoscopically.  PPI is recommended long-term should he continue taking Plavix.  Recommendation by GI is Protonix 40 mg twice daily x 8 weeks then 40 mg daily. - Cont Protonix 40 mg twice daily - Transfuse for Hgb <8 - AM CBC Type 2 diabetes mellitus with diabetic polyneuropathy, with long-term current use of insulin (HCC) Elevated CBGs up to 485 overnight.  Given 10 units of NovoLog with next CBG 365.  Suspect he will have better control now that sliding scale has  been restarted and he has been transition back to carb modified diet. Fasting CBG 205. - Increase Semglee to 12u daily (home dosing) - Restart sensitive SSI - CBGs 4 times daily, with meals and bedtime New onset atrial fibrillation (HCC) Unclear diagnosis: New onset A-fib versus paroxysmal atrial tachycardia.  Cardiology is also considering outpatient 30-day event monitor to assess for A-fib. -Cardiology following, appreciate recommendations -Continuous cardiac monitoring -Continue Toprol-XL 25 mg daily -Goal potassium> 4, magnesium> 2 Edema of both lower extremities Euvolemic on exam. -Continue TED hose Blister Large yellow, fluid-filled bullae on R leg likely secondary to improperly fitting prosthetic. Not observed during rounding.  -Plan for outpatient follow-up with orthopedics at discharge, with Dr. Lajoyce Corners -If patient remains hospitalized on 10/7 will ask Dr. Lajoyce Corners to see him again inpatient -Continue compression and wound care Hypertension Attempting to titrate to GDMT max tolerated dose. - continue to monitor VS  Chronic and Stable Issues: HLD: Continue Crestor GERD: Continue Protonix BPH: Continue Flomax, hold home terazosin ISO low BP CAD: Restart Plavix 10/6  FEN/GI: Carb modified diet PPx: SCDs Dispo:SNF in 2-3 days. Barriers include continued cardiac workup, placement.   Subjective:  He does not have any complaints this morning.  He would like to go home.  Objective: Temp:  [97.7 F (36.5 C)-98.6 F (37 C)] 98.6 F (37 C) (10/05 0254) Pulse Rate:  [60-81] 73 (10/04 2351) Resp:  [15-23] 20 (10/05 0254) BP: (98-123)/(57-76) 109/67 (10/05 0254) SpO2:  [97 %-100 %] 97 % (10/05 0254) Weight:  [73.9 kg] 73.9 kg (10/05 0315) Physical Exam: General: Well-appearing, sleeping comfortably but arousable to voice Cardiovascular: RRR, no murmurs auscultated Respiratory: CTAB, no  WOB Abdomen: Normoactive bowel sounds Extremities: No pitting edema  appreciable  Laboratory: Most recent CBC Lab Results  Component Value Date   WBC 8.6 07/09/2023   HGB 9.7 (L) 07/09/2023   HCT 31.4 (L) 07/09/2023   MCV 89.7 07/09/2023   PLT 141 (L) 07/09/2023   Most recent BMP    Latest Ref Rng & Units 07/09/2023    2:25 AM  BMP  Glucose 70 - 99 mg/dL 161   BUN 8 - 23 mg/dL 19   Creatinine 0.96 - 1.24 mg/dL 0.45   Sodium 409 - 811 mmol/L 135   Potassium 3.5 - 5.1 mmol/L 4.1   Chloride 98 - 111 mmol/L 96   CO2 22 - 32 mmol/L 30   Calcium 8.9 - 10.3 mg/dL 8.9    Imaging/Diagnostic Tests: No recent imaging results  Shelby Mattocks, DO 07/09/2023, 6:51 AM  PGY-3, [facility] FPTS Intern pager: [phone], text pages welcome Secure chat group [facility] Teaching Service

## 2023-07-09 NOTE — Assessment & Plan Note (Addendum)
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 Not observed during rounding.  -Plan for outpatient follow-up with orthopedics at discharge, with Dr. Lajoyce Corners -If patient remains hospitalized on 10/7 will ask Dr. Lajoyce Corners to see him again inpatient -Continue compression and wound care

## 2023-07-09 NOTE — Assessment & Plan Note (Signed)
Unclear diagnosis: New onset A-fib versus paroxysmal atrial tachycardia.  Cardiology is also considering outpatient 30-day event monitor to assess for A-fib. -Cardiology following, appreciate recommendations -Continuous cardiac monitoring -Continue Toprol-XL 25 mg daily -Goal potassium> 4, magnesium> 2

## 2023-07-09 NOTE — Assessment & Plan Note (Signed)
Attempting to titrate to GDMT max tolerated dose. - continue to monitor VS

## 2023-07-10 LAB — BASIC METABOLIC PANEL
Anion gap: 8 (ref 5–15)
BUN: 16 mg/dL (ref 8–23)
CO2: 28 mmol/L (ref 22–32)
Calcium: 8.8 mg/dL — ABNORMAL LOW (ref 8.9–10.3)
Chloride: 100 mmol/L (ref 98–111)
Creatinine, Ser: 1.12 mg/dL (ref 0.61–1.24)
GFR, Estimated: >60 mL/min (ref >60)
Glucose, Bld: 201 mg/dL — ABNORMAL HIGH (ref 70–99)
Potassium: 3.7 mmol/L (ref 3.5–5.1)
Sodium: 136 mmol/L (ref 135–145)

## 2023-07-10 LAB — GLUCOSE, CAPILLARY
Glucose-Capillary: 223 mg/dL — ABNORMAL HIGH (ref 70–99)
Glucose-Capillary: 415 mg/dL — ABNORMAL HIGH (ref 70–99)

## 2023-07-10 LAB — CBC
HCT: 30.5 % — ABNORMAL LOW (ref 39.0–52.0)
Hemoglobin: 9.6 g/dL — ABNORMAL LOW (ref 13.0–17.0)
MCH: 28.9 pg (ref 26.0–34.0)
MCHC: 31.5 g/dL (ref 30.0–36.0)
MCV: 91.9 fL (ref 80.0–100.0)
Platelets: 110 10*3/uL — ABNORMAL LOW (ref 150–400)
RBC: 3.32 MIL/uL — ABNORMAL LOW (ref 4.22–5.81)
RDW: 20.8 % — ABNORMAL HIGH (ref 11.5–15.5)
WBC: 6.4 10*3/uL (ref 4.0–10.5)
nRBC: 0 % (ref 0.0–0.2)

## 2023-07-10 MED ORDER — PANTOPRAZOLE SODIUM 40 MG PO TBEC: 40.0000 mg | DELAYED_RELEASE_TABLET | Freq: Two times a day (BID) | ORAL | 0 refills | Status: DC

## 2023-07-10 MED ORDER — ISOSORBIDE MONONITRATE ER 60 MG PO TB24: 60.0000 mg | ORAL_TABLET | Freq: Every day | ORAL | 0 refills | Status: DC

## 2023-07-10 MED ORDER — POTASSIUM CHLORIDE CRYS ER 20 MEQ PO TBCR
40.0000 meq | EXTENDED_RELEASE_TABLET | Freq: Once | ORAL | Status: AC
  Administered 2023-07-10: 40 meq via ORAL
  Filled 2023-07-10: qty 2

## 2023-07-10 MED ORDER — INSULIN ASPART 100 UNIT/ML IJ SOLN
9.0000 [IU] | Freq: Once | INTRAMUSCULAR | Status: AC
  Administered 2023-07-10: 9 [IU] via SUBCUTANEOUS

## 2023-07-10 MED ORDER — FUROSEMIDE 20 MG PO TABS
20.0000 mg | ORAL_TABLET | Freq: Every day | ORAL | 0 refills | Status: AC
Start: 2023-07-10 — End: ?

## 2023-07-10 MED ORDER — CLOPIDOGREL BISULFATE 75 MG PO TABS
75.0000 mg | ORAL_TABLET | Freq: Every day | ORAL | Status: DC
  Administered 2023-07-10: 75 mg via ORAL
  Filled 2023-07-10: qty 1

## 2023-07-10 MED ORDER — GABAPENTIN 400 MG PO CAPS
400.0000 mg | ORAL_CAPSULE | Freq: Every day | ORAL | 0 refills | Status: AC
Start: 2023-07-10 — End: ?

## 2023-07-10 NOTE — Discharge Summary (Addendum)
Family Medicine Teaching Service [facility] Discharge Summary  Patient name: Keith Espinoza Medical record number: [id number] Date of birth: 1941/04/20 Age: 82 y.o. Gender: male Date of Admission: 07/03/2023  Date of Discharge: 07/10/23 Admitting Physician: Alfredo Martinez, MD  Primary Care Provider: Emilio Aspen, MD Consultants: Cardiology, GI  Indication for Hospitalization: CHF exacerbation  Discharge Diagnoses/Problem List:  Principal Problem for Admission: CHF exacerbation Other Problems addressed during stay:  Principal Problem:   CHF exacerbation (HCC) Active Problems:   Hypertension   Coronary artery disease   Type 2 diabetes mellitus with diabetic polyneuropathy, with long-term current use of insulin (HCC)   Shortness of breath   Edema of both lower extremities   Iron deficiency anemia   Blister   Acute on chronic anemia   New onset atrial fibrillation (HCC)   Ischemic cardiomyopathy   Acute systolic CHF (congestive heart failure) (HCC)   Pressure injury of skin   Gastric AVM   Diverticulosis of colon without hemorrhage   Gastrointestinal hemorrhage associated with acute gastritis   PSVT (paroxysmal supraventricular tachycardia) (HCC)   Paroxysmal A-fib (HCC)   Grade I diastolic dysfunction   Acute on chronic combined systolic and diastolic HF (heart failure) [facility])  Brief Hospital Course:  Keith Espinoza is a 82 y.o.male with a history of anemia, HTN, T2DM, CAD with stents who was admitted to the [facility] Teaching Service at [facility] for shortness of breath. His hospital course is detailed below:  Dyspnea  CHF exacerbation  Patient presented for 3 weeks of dyspnea, orthopnea, and generalized weakness. Upon arrival to ED, patient satting well and trops trended flat. EKG with T wave inversions consistent with prior. Elevated BNP 1247.5 and CXR with possible small R pleural effusion. On exam, patient with bibasilar crackles and pitting edema of L leg.  Patient given Lasix diuresis with adequate urine output and symptomatic improvement. Echo demonstrated LVEF 40-45% with G1DD and mildly dilated left atrium. Overall, patient's presentation likely secondary to acute CHF exacerbation.  By time of discharge patient was able to comfortably lay flat and maintain appropriate O2 saturation on RA. Patient was discharged on 20 mg daily of Lasix PO.  Anemia Patient worked up for hematemesis 3 weeks prior to admission with unremarkable EGD. Reported dark stools since last discharge, no bleeding signs noted during latest admission. At presentation, patient with Hgb 7.2. After receiving 1u pRBC, pt's hemoglobin remained stable around 9-10, which appears to be his baseline. Given ongoing fluctuation and incomplete characterization of possible sources of bleeding, GI was consulted for further workup and he had a colonoscopy. Colonoscopy showed diverticulosis, however no sites of bleeding.  Subsequent EGD was performed which showed small angioectasia with active bleeding as well as areas of mucosal bruising within the gastric body.  All were treated with APC for hemostasis.  He was treated with 1 dose IV iron and placed on oral iron and PPI thereafter.  He was hemodynamically stable at discharge.  Edema Presented with generalized edema since hospital discharge 3 weeks prior to admission. Marked pitting edema of entire L leg, that patient believes has remained unchanged since femur fracture repair in August. Patient received lasix diuresis with adequate urine output, and was given compression stockings. By time of discharge, swelling was stable.  Other chronic conditions were medically managed with home medications and formulary alternatives as necessary: CAD (plavix restarted after EGD), HLD, GERD, BPH (held home terazosin and added flomax)  PCP Follow-up Recommendations: Reassess BP control on Entresto. Consider addition  of other GDMT as tolerated Recheck CBC (low  plts), CMP (Cr and LFTs), lipid panel (goal LDL <55) at follow-up Assess diabetes control on home regimen Ensure follow-up with orthopedics for proper fitting prosthetic as well as cardiology for potential 30-day event monitor Repeat iron studies in 1 month to assess IDA. If continued bleeding, can repeat EGD per GI Continue Protonix 40 mg p.o. twice daily until around 09/04/23, then reduce to 40 mg daily for ongoing gastric prophylaxis  Disposition: Home  Discharge Condition: Stable  Discharge Exam:  Vitals:   07/10/23 0732 07/10/23 0747  BP: 100/81   Pulse: 75   Resp:    Temp: 97.9 F (36.6 C)   SpO2:  95%   General: Alert and oriented, in NAD Skin: Warm, dry, and intact without lesions HEENT: NCAT, EOM grossly normal, midline nasal septum Cardiac: RRR, no m/r/g appreciated Respiratory: CTAB, breathing and speaking comfortably on RA Abdominal: Soft, nontender, nondistended, normoactive bowel sounds Extremities: Moves all extremities grossly equally, bilateral lower extremity amputations Neurological: No gross focal deficit Psychiatric: Appropriate mood and affect  Significant Procedures: EGD and colonoscopy as above  Significant Labs and Imaging:  Recent Labs  Lab 07/09/23 0225 07/10/23 0227  WBC 8.6 6.4  HGB 9.7* 9.6*  HCT 31.4* 30.5*  PLT 141* 110*   Recent Labs  Lab 07/09/23 0225 07/09/23 1720 07/10/23 0227  NA 135 134* 136  K 4.1 4.1 3.7  CL 96* 96* 100  CO2 30 27 28   GLUCOSE 235* 390* 201*  BUN 19 16 16   CREATININE 1.12 1.26* 1.12  CALCIUM 8.9 8.9 8.8*   CXR 07/03/23 IMPRESSION: 1. Low lung volumes with asymmetric elevation of the right hemidiaphragm. 2. Right lung base opacity is favored to represent a combination of small pleural effusion with overlying atelectasis or airspace disease.  Discharge Medications:  Allergies as of 07/10/2023       Reactions   Simvastatin Other (See Comments)   SEVERE MYALGIAS   Zetia [ezetimibe] Other (See  Comments)   MYALGIAS   Dilaudid [hydromorphone Hcl] Other (See Comments)   hallucination        Medication List     STOP taking these medications    amLODipine 10 MG tablet Commonly known as: NORVASC   amoxicillin-clavulanate 875-125 MG tablet Commonly known as: AUGMENTIN   aspirin EC 81 MG tablet   losartan 25 MG tablet Commonly known as: COZAAR   metoprolol tartrate 25 MG tablet Commonly known as: LOPRESSOR   pioglitazone 30 MG tablet Commonly known as: ACTOS   terazosin 5 MG capsule Commonly known as: HYTRIN   traZODone 50 MG tablet Commonly known as: DESYREL       TAKE these medications    acetaminophen 500 MG tablet Commonly known as: TYLENOL Take 1,000 mg by mouth every 6 (six) hours as needed (pain).   allopurinol 300 MG tablet Commonly known as: ZYLOPRIM Take 300 mg by mouth daily.   B-12 2500 MCG Tabs Take 2,500 mcg by mouth daily.   Basaglar KwikPen 100 UNIT/ML Inject 12 Units into the skin daily.   clopidogrel 75 MG tablet Commonly known as: PLAVIX Take 1 tablet (75 mg total) by mouth daily.   clotrimazole-betamethasone cream Commonly known as: LOTRISONE Apply topically 2 (two) times daily.   Dexcom G7 Sensor Misc 1 Device by Does not apply route as directed.   Entresto 24-26 MG Generic drug: sacubitril-valsartan Take 1 tablet by mouth 2 (two) times daily.   Fish Oil 1200 MG Caps  Take by mouth.   furosemide 20 MG tablet Commonly known as: LASIX Take 1 tablet (20 mg total) by mouth daily.   gabapentin 300 MG capsule Commonly known as: NEURONTIN Take 600 mg by mouth at bedtime.   gabapentin 400 MG capsule Commonly known as: NEURONTIN Take 1 capsule (400 mg total) by mouth daily.   Insulin Pen Needle 32G X 4 MM Misc 1 Device by Does not apply route in the morning, at noon, in the evening, and at bedtime.   isosorbide mononitrate 60 MG 24 hr tablet Commonly known as: IMDUR Take 1 tablet (60 mg total) by mouth daily.    Jardiance 25 MG Tabs tablet Generic drug: empagliflozin TAKE 1 TABLET(25 MG) BY MOUTH DAILY   MAGNESIUM PO Take 1 tablet by mouth at bedtime.   metFORMIN 1000 MG tablet Commonly known as: GLUCOPHAGE Take 1 tablet (1,000 mg total) by mouth 2 (two) times daily.   metoprolol succinate 25 MG 24 hr tablet Commonly known as: TOPROL-XL Take 1 tablet (25 mg total) by mouth daily.   nitroGLYCERIN 0.4 MG SL tablet Commonly known as: NITROSTAT Place 0.4 mg under the tongue every 5 (five) minutes x 3 doses as needed for chest pain.   NovoLOG FlexPen 100 UNIT/ML FlexPen Generic drug: insulin aspart Max daily 30 units What changed:  how much to take how to take this when to take this additional instructions   pantoprazole 40 MG tablet Commonly known as: PROTONIX Take 1 tablet (40 mg total) by mouth 2 (two) times daily. What changed: when to take this   Potassium Chloride ER 20 MEQ Tbcr Take 1 tablet by mouth daily.   QC TUMERIC COMPLEX PO Take 1 capsule by mouth at bedtime.   rosuvastatin 20 MG tablet Commonly known as: CRESTOR Take 1 tablet (20 mg total) by mouth daily.   senna-docusate 8.6-50 MG tablet Commonly known as: Senokot-S Take 1 tablet by mouth at bedtime. What changed: when to take this   tamsulosin 0.4 MG Caps capsule Commonly known as: FLOMAX Take 1 capsule (0.4 mg total) by mouth daily after supper.   Evaristo Bury FlexTouch 100 UNIT/ML FlexTouch Pen Generic drug: insulin degludec Inject 12 Units into the skin daily.   VITAMIN C PO Take 1 tablet by mouth at bedtime.   Vitamin D3 50 MCG (2000 UT) Tabs Take 2,000 Units by mouth at bedtime.        Discharge Instructions: Please refer to Patient Instructions section of EMR for full details.  Patient was counseled important signs and symptoms that should prompt return to medical care, changes in medications, dietary instructions, activity restrictions, and follow up appointments.   Follow-Up Appointments:   Follow-up Information     Molli Hazard Thomasene Ripple, NP Follow up.   Specialty: Cardiology Why: [facility] - [place] [facility] - cardiology follow-up on Wednesday Jul 20, 2023 at 1:55 PM. Arrive 15 minutes prior to appointment to check in. Verdon Cummins is one of our nurse practitioners with our heart team. Please note this is at a different location than you normally visit. Our [place] location is over in [facility]. Contact information: [address] [place] [address] [phone]         Emilio Aspen, MD. Schedule an appointment as soon as possible for a visit.   Specialty: Internal Medicine Why: Make an appointment for hospital follow up as soon as possible. Contact information: [address] [place] [address] [phone]  Tomie China, MD Psychiatry resident PGY 1 07/10/2023

## 2023-07-10 NOTE — Assessment & Plan Note (Deleted)
Euvolemic on exam. -Continue TED hose

## 2023-07-10 NOTE — Assessment & Plan Note (Deleted)
Plan to restart Plavix today. - Per cardiology will restart home dose of Imdur 60 mg daily and Toprol-XL 25 mg daily

## 2023-07-10 NOTE — Progress Notes (Signed)
MD made aware about pt's refusal to have his vital signs and cbg taken  and wanted to go home.

## 2023-07-10 NOTE — Progress Notes (Signed)
Meds from [facility] pharmacy delivered to pt. All set for discharge.

## 2023-07-10 NOTE — TOC Transition Note (Signed)
Transition of Care [facility]) - CM/SW Discharge Note   Patient Details  Name: Stanislaus Kaltenbach MRN: [id number] Date of Birth: 01-14-1941  [facility]) CM/SW Contact:  Ronny Bacon, RN Phone Number: 07/10/2023, 12:49 PM   Clinical Narrative:  patient is being discharged today. Heather with [facility] informed.     Final next level of care: Home w Home Health Services Barriers to Discharge: No Barriers Identified   Patient Goals and CMS Choice      Discharge Placement                         Discharge Plan and Services Additional resources added to the After Visit Summary for                                       Social Determinants of Health (SDOH) Interventions SDOH Screenings   Food Insecurity: No Food Insecurity (07/04/2023)  Housing: Low Risk  (07/04/2023)  Transportation Needs: No Transportation Needs (07/05/2023)  Utilities: Not At Risk (07/04/2023)  Depression (PHQ2-9): Low Risk  (04/21/2022)  Financial Resource Strain: Low Risk  (05/16/2023)   Received from [facility]  Social Connections: Unknown (05/12/2023)   Received from [facility]  Stress: No Stress Concern Present (06/12/2023)   Received from [facility]  Tobacco Use: High Risk (07/08/2023)     Readmission Risk Interventions     No data to display

## 2023-07-10 NOTE — Assessment & Plan Note (Deleted)
-  Per cardiology will not start DOAC therapy at this time as unclear if this is true A-fib versus atrial tachycardia. - Cardiology recommends outpatient 30-day event monitor to assess for atrial fibrillation - Follow-up with 07/20/2023 at [facility]. - Continue Toprol-XL 25 mg daily

## 2023-07-10 NOTE — Assessment & Plan Note (Deleted)
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 Not observed during rounding.  -Plan for outpatient follow-up with orthopedics at discharge, with Dr. Lajoyce Corners -If patient remains hospitalized on 10/7 will ask Dr. Lajoyce Corners to see him again inpatient -Continue compression and wound care

## 2023-07-10 NOTE — Assessment & Plan Note (Deleted)
Attempting to titrate to GDMT max tolerated dose. - continue to monitor VS

## 2023-07-10 NOTE — Assessment & Plan Note (Deleted)
Hemoglobin 9.7 ? 9.6 on 10/6.  S/p 1 unit pRBCs.  Normal colonoscopy.  EGD revealed single bleeding angiectasia and gastritis which was treated endoscopically.  PPI is recommended long-term should he continue taking Plavix.  Recommendation by GI is Protonix 40 mg twice daily x 8 weeks then 40 mg daily. - Cont Protonix 40 mg twice daily - Transfuse for Hgb <8 - AM CBC

## 2023-07-10 NOTE — Assessment & Plan Note (Deleted)
Echo with EF 40 to 45% representing new onset HFrEF.  Euvolemic at this time.  Will attempt to titrate GDMT to max dose tolerated prior to discharge.  - Cardiology following, appreciate recommendations - Started entresto.  - Toprol 25mg  every day. - Lasix 20 mg daily, Jardiance 25 mg daily - Imdur 60 mg daily - Continue Flomax 0.4 mg daily - Strict I's and O's and daily weights. - Compression stocking on left lower extremity

## 2023-07-10 NOTE — Progress Notes (Signed)
Reported by NT that he is refusing  vital signs to be taken and said that he wants to go home. Explained to him that we are still waiting for MD to decide  whether to discharge him today.

## 2023-07-10 NOTE — Assessment & Plan Note (Deleted)
Elevated CBGs up to 485 overnight.  Given 10 units of NovoLog with next CBG 365.  Suspect he will have better control now that sliding scale has been restarted and he has been transition back to carb modified diet. Fasting CBG 205. - Increase Semglee to 12u daily (home dosing) - Restart sensitive SSI - CBGs 4 times daily, with meals and bedtime

## 2023-07-10 NOTE — Assessment & Plan Note (Deleted)
Arrival Hgb 7.2. S/p 1 unit pRBCs. Patient's baseline appears around 9 since July 2024. Patient worked up for hematemesis 3 weeks prior with unremarkable EGD. No new bleeding symptoms at this time.  - Restart Plavix today.  - Cont PO PPI (Protonix) - Transfuse for Hgb <8 - CTM for bleeding symptoms - AM CBC

## 2023-07-10 NOTE — Assessment & Plan Note (Deleted)
Unclear diagnosis: New onset A-fib versus paroxysmal atrial tachycardia.  Cardiology is also considering outpatient 30-day event monitor to assess for A-fib. -Cardiology following, appreciate recommendations -Continuous cardiac monitoring - Restart Plavix today. -Continue Toprol-XL 25 mg daily -Goal potassium> 4, magnesium> 2

## 2023-07-11 ENCOUNTER — Encounter (HOSPITAL_COMMUNITY): Payer: Self-pay | Admitting: Gastroenterology

## 2023-07-11 DIAGNOSIS — E1151 Type 2 diabetes mellitus with diabetic peripheral angiopathy without gangrene: Secondary | ICD-10-CM | POA: Diagnosis not present

## 2023-07-11 DIAGNOSIS — K2971 Gastritis, unspecified, with bleeding: Secondary | ICD-10-CM | POA: Diagnosis not present

## 2023-07-11 DIAGNOSIS — M9702XD Periprosthetic fracture around internal prosthetic left hip joint, subsequent encounter: Secondary | ICD-10-CM | POA: Diagnosis not present

## 2023-07-11 DIAGNOSIS — S72452D Displaced supracondylar fracture without intracondylar extension of lower end of left femur, subsequent encounter for closed fracture with routine healing: Secondary | ICD-10-CM | POA: Diagnosis not present

## 2023-07-11 DIAGNOSIS — E1165 Type 2 diabetes mellitus with hyperglycemia: Secondary | ICD-10-CM | POA: Diagnosis not present

## 2023-07-11 DIAGNOSIS — H353211 Exudative age-related macular degeneration, right eye, with active choroidal neovascularization: Secondary | ICD-10-CM | POA: Diagnosis not present

## 2023-07-12 DIAGNOSIS — M978XXD Periprosthetic fracture around other internal prosthetic joint, subsequent encounter: Secondary | ICD-10-CM | POA: Diagnosis not present

## 2023-07-12 DIAGNOSIS — Z96659 Presence of unspecified artificial knee joint: Secondary | ICD-10-CM | POA: Diagnosis not present

## 2023-07-15 DIAGNOSIS — K2971 Gastritis, unspecified, with bleeding: Secondary | ICD-10-CM | POA: Diagnosis not present

## 2023-07-15 DIAGNOSIS — S72452D Displaced supracondylar fracture without intracondylar extension of lower end of left femur, subsequent encounter for closed fracture with routine healing: Secondary | ICD-10-CM | POA: Diagnosis not present

## 2023-07-15 DIAGNOSIS — H353211 Exudative age-related macular degeneration, right eye, with active choroidal neovascularization: Secondary | ICD-10-CM | POA: Diagnosis not present

## 2023-07-15 DIAGNOSIS — M9702XD Periprosthetic fracture around internal prosthetic left hip joint, subsequent encounter: Secondary | ICD-10-CM | POA: Diagnosis not present

## 2023-07-15 DIAGNOSIS — E1165 Type 2 diabetes mellitus with hyperglycemia: Secondary | ICD-10-CM | POA: Diagnosis not present

## 2023-07-15 DIAGNOSIS — E1151 Type 2 diabetes mellitus with diabetic peripheral angiopathy without gangrene: Secondary | ICD-10-CM | POA: Diagnosis not present

## 2023-07-19 DIAGNOSIS — S72452D Displaced supracondylar fracture without intracondylar extension of lower end of left femur, subsequent encounter for closed fracture with routine healing: Secondary | ICD-10-CM | POA: Diagnosis not present

## 2023-07-19 DIAGNOSIS — E1165 Type 2 diabetes mellitus with hyperglycemia: Secondary | ICD-10-CM | POA: Diagnosis not present

## 2023-07-19 DIAGNOSIS — E1151 Type 2 diabetes mellitus with diabetic peripheral angiopathy without gangrene: Secondary | ICD-10-CM | POA: Diagnosis not present

## 2023-07-19 DIAGNOSIS — K2971 Gastritis, unspecified, with bleeding: Secondary | ICD-10-CM | POA: Diagnosis not present

## 2023-07-19 DIAGNOSIS — M9702XD Periprosthetic fracture around internal prosthetic left hip joint, subsequent encounter: Secondary | ICD-10-CM | POA: Diagnosis not present

## 2023-07-19 DIAGNOSIS — H353211 Exudative age-related macular degeneration, right eye, with active choroidal neovascularization: Secondary | ICD-10-CM | POA: Diagnosis not present

## 2023-07-20 ENCOUNTER — Ambulatory Visit: Payer: Medicare Other | Attending: General Practice | Admitting: General Practice

## 2023-07-20 NOTE — Progress Notes (Deleted)
Cardiology Office Note:    Date:  07/20/2023  ID:  Theodora Blow, Very January 09, 1941, MRN [id number] PCP: Emilio Aspen, MD  [facility] Providers Cardiologist:  Lance Muss, MD { Click to update primary MD,subspecialty MD or APP then REFRESH:1}    {Click to Open Review  :1}   Patient Profile:     Coronary artery disease DES to LAD, RCA, left circumflex in 2004 DES to RCA 2012, circumflex 2013 Hypertension Type 2 diabetes Peripheral vascular disease Anemia Combined systolic and diastolic heart failure      History of Present Illness:  Discussed the use of AI scribe software for clinical note transcription with the patient, who gave verbal consent to proceed.  Keith Espinoza is a 82 y.o. male who returns for hospital follow-up post heart failure with mildly reduced EF, atrial fibrillation.  He has history of extensive coronary artery disease. DES to LAD, RCA, left circumflex in 2004. DES to RCA 2012, circumflex 2013  He was recently admitted in July 2024 for where he underwent cardiac catheterization for unstable angina.  He was noted to have multivessel CAD with moderate in-stent restenosis of his 3 main vessels.  CT surgery felt he would not be a good CABG candidate, plan for medical management.  He was admitted on 08/03/2023 for new onset heart failure with mildly reduced EF.  He presented for 3 weeks of dyspnea, orthopnea and weakness.  BNP 1247.5 and chest x-ray with possible right pleural effusion.  Echo during admission showed EF of 40 to 45% with mid to distal anterior septal, mid inferior lateral and apical anterior as well as apical inferior hypokinesis, grade 1 DD, which was new from his echo 2 months ago where EF was 60 to 65%.  GDMT includes Jardiance 25 mg, Lasix 20 mg, metoprolol succinate 25 mg, nitroglycerin as needed, Entresto 24-26 mg  Of note he was being worked up for anemia review weeks prior to admission.  He reported dark  stools since his last discharge.  At presentation his hemoglobin was 7.2.  He did undergo an endoscopy and no longer that showed gastritis and superficial erosions.  He did require 1 unit PRBC which raised his hemoglobin arrangements made around 9-10 which appears to be just baseline admission colonoscopy showed diverticulosis.  EGD demonstrated single bleeding and angiectasia in the gastric body with active bleeding along with gastritis with oozing status post APC and clip of the angioectasia and a APC diffuse gastritis.  Patient at high risk for bleeding.  Started on oral iron. GI recommended ongoing PPI long-term and GI okay to restart Plavix 75 mg alone.  During his admission there was question of new onset paroxysmal atrial fibrillation.  Telemetry was difficult to reassess as there were areas where it was suspicious for A-fib but intermittently will see P waves.  EKG showed normal sinus rhythm with frequent PACs, not A-fib.  Given his GI bleed and findings on endoscopy it was decided not to start DOAC therapy at this time if we are unsure if this is truly A-fib.  It was recommended for outpatient 30-day event monitoring to assess for any A-fib.    History of Present Illness            ROS   See HPI ***    Studies Reviewed:       *** Risk Assessment/Calculations:   {Does this patient have ATRIAL FIBRILLATION?[phone]} No BP recorded.  {Refresh Note OR Click here to enter BP  :1}***  Physical Exam:   VS:  There were no vitals taken for this visit.   Wt Readings from Last 3 Encounters:  07/10/23 162 lb 4.1 oz (73.6 kg)  06/23/23 164 lb (74.4 kg)  06/22/23 164 lb 0.4 oz (74.4 kg)    Physical Exam***     Assessment and Plan:  Paroxysmal atrial fibrillation -Telemetry difficult to assess during admission -Suspicious for A-fib however EKG normal sinus rhythm with frequent PACs -Due to GI bleed, will not start DOAC therapy Continue Toprol-XL 25 mg daily  Heart failure  with moderately reduced ejection fraction              {Are you ordering a CV Procedure (e.g. stress test, cath, DCCV, TEE, etc)?   Press F2        :[id number]}  Dispo:  No follow-ups on file.  Signed, Denyce Robert, AGNP-C

## 2023-07-21 ENCOUNTER — Encounter: Payer: Self-pay | Admitting: General Practice

## 2023-07-21 ENCOUNTER — Ambulatory Visit: Payer: Medicare Other | Admitting: Orthopedic Surgery

## 2023-07-21 DIAGNOSIS — Z89511 Acquired absence of right leg below knee: Secondary | ICD-10-CM

## 2023-07-21 DIAGNOSIS — E785 Hyperlipidemia, unspecified: Secondary | ICD-10-CM | POA: Diagnosis not present

## 2023-07-21 DIAGNOSIS — D500 Iron deficiency anemia secondary to blood loss (chronic): Secondary | ICD-10-CM | POA: Diagnosis not present

## 2023-07-22 DIAGNOSIS — N471 Phimosis: Secondary | ICD-10-CM | POA: Diagnosis not present

## 2023-07-22 DIAGNOSIS — D500 Iron deficiency anemia secondary to blood loss (chronic): Secondary | ICD-10-CM | POA: Diagnosis not present

## 2023-07-22 DIAGNOSIS — I509 Heart failure, unspecified: Secondary | ICD-10-CM | POA: Diagnosis not present

## 2023-07-22 DIAGNOSIS — E785 Hyperlipidemia, unspecified: Secondary | ICD-10-CM | POA: Diagnosis not present

## 2023-07-26 ENCOUNTER — Encounter: Payer: Self-pay | Admitting: Orthopedic Surgery

## 2023-07-26 NOTE — Progress Notes (Signed)
Office Visit Note   Patient: Keith Espinoza           Date of Birth: 03-23-1941           MRN: [id number] Visit Date: 07/21/2023              Requested by: Emilio Aspen, MD [address] [place],  [place] [address] PCP: Emilio Aspen, MD  Chief Complaint  Patient presents with   Right Leg - Pain    Hx BKA c/o blister       HPI: Patient is an 82 year old gentleman status post right transtibial amputation who states he has had a blister for 2 weeks on the residual limb.  Patient recently has been hospitalized for supracondylar femur fracture with surgery at [facility].  Patient states that while in the hospital he had increased swelling did not wear his prosthesis and when he resumed wearing the prosthesis he developed blisters.  Assessment & Plan: Visit Diagnoses:  1. Right below-knee amputee [facility])     Plan: Patient will resume wearing his compression stockings around-the-clock and will resume the socket fitting once the pitting edema has resolved.  Follow-Up Instructions: Return in about 4 weeks (around 08/18/2023).   Ortho Exam  Patient is alert, oriented, no adenopathy, well-dressed, normal affect, normal respiratory effort. Examination patient has pitting edema of the right transtibial amputation there is no cellulitis no drainage.  There is a healing blister 5 mm in diameter over the distal tibial crest.  No exposed bone or tendon no drainage.  Healthy granulation tissue.  Imaging: No results found. No images are attached to the encounter.  Labs: Lab Results  Component Value Date   HGBA1C 7.1 (A) 04/01/2023   HGBA1C 7.2 (A) 11/11/2022   HGBA1C 9.0 (A) 06/23/2022   REPTSTATUS 07/08/2023 FINAL 07/03/2023   GRAMSTAIN  04/13/2019    RARE WBC PRESENT, PREDOMINANTLY PMN NO ORGANISMS SEEN    CULT  07/03/2023    NO GROWTH 5 DAYS Performed at [facility], [address] [place], [place] [address]      Lab  Results  Component Value Date   ALBUMIN 2.6 (L) 07/04/2023   ALBUMIN 2.3 (L) 07/03/2023   ALBUMIN 3.4 (L) 04/24/2023    Lab Results  Component Value Date   MG 2.0 07/08/2023   MG 1.9 07/07/2023   MG 1.8 07/06/2023   No results found for: "VD25OH"  No results found for: "PREALBUMIN"    Latest Ref Rng & Units 07/10/2023    2:27 AM 07/09/2023    2:25 AM 07/08/2023    2:21 AM  CBC EXTENDED  WBC 4.0 - 10.5 K/uL 6.4  8.6  7.1   RBC 4.22 - 5.81 MIL/uL 3.32  3.50  3.76   Hemoglobin 13.0 - 17.0 g/dL 9.6  9.7  64.4   HCT 03.4 - 52.0 % 30.5  31.4  33.2   Platelets 150 - 400 K/uL 110  141  129   NEUT# 1.7 - 7.7 K/uL  6.2    Lymph# 0.7 - 4.0 K/uL  1.3       There is no height or weight on file to calculate BMI.  Orders:  No orders of the defined types were placed in this encounter.  No orders of the defined types were placed in this encounter.    Procedures: No procedures performed  Clinical Data: No additional findings.  ROS:  All other systems negative, except as noted in  the HPI. Review of Systems  Objective: Vital Signs: There were no vitals taken for this visit.  Specialty Comments:  No specialty comments available.  PMFS History: Patient Active Problem List   Diagnosis Date Noted   Acute on chronic combined systolic and diastolic HF (heart failure) (HCC) 07/09/2023   Gastric AVM 07/08/2023   Diverticulosis of colon without hemorrhage 07/08/2023   Gastrointestinal hemorrhage associated with acute gastritis 07/08/2023   PSVT (paroxysmal supraventricular tachycardia) (HCC) 07/08/2023   Paroxysmal A-fib (HCC) 07/08/2023   Grade I diastolic dysfunction 07/08/2023   Acute on chronic anemia 07/07/2023   New onset atrial fibrillation (HCC) 07/07/2023   Ischemic cardiomyopathy 07/07/2023   Acute systolic CHF (congestive heart failure) (HCC) 07/07/2023   Pressure injury of skin 07/07/2023   Blister 07/06/2023   Edema of both lower extremities 07/05/2023   CHF  exacerbation (HCC) 07/05/2023   Iron deficiency anemia 07/05/2023   Shortness of breath 07/03/2023   Finger numbness 05/05/2023   Elevated troponin 04/25/2023   Chest pain of uncertain etiology 04/23/2023   Unstable angina (HCC) 04/23/2023   AKI (acute kidney injury) (HCC) 04/23/2023   History of CAD (coronary artery disease) 04/23/2023   Contusion of right hip 03/08/2023   Type 2 diabetes mellitus with hyperglycemia, with long-term current use of insulin (HCC) 02/15/2022   Adenoma of left adrenal gland 02/15/2022   History of right below knee amputation (HCC) 10/06/2021   Exudative age-related macular degeneration of left eye with active choroidal neovascularization (HCC) 07/28/2021   Exudative age-related macular degeneration of right eye with active choroidal neovascularization (HCC) 06/01/2021   Intermediate stage nonexudative age-related macular degeneration of both eyes 06/01/2021   Type 2 diabetes mellitus with diabetic polyneuropathy, with long-term current use of insulin (HCC) 03/24/2021   Constipation 02/02/2021   Fecal impaction (HCC) 02/01/2021   Wound dehiscence 01/09/2021   Dehiscence of amputation stump (HCC)    Status post percutaneous transluminal coronary angioplasty 01/06/2021   Type II diabetes mellitus, uncontrolled 01/06/2021   Acute pancreatitis 01/06/2021   Diabetic peripheral vascular disease (HCC) 01/06/2021   Encounter for screening for other disorder 01/06/2021   Enlarged prostate 01/06/2021   Foot ulcer, right (HCC) 01/06/2021   Gout 01/06/2021   Headache 01/06/2021   Neck pain 01/06/2021   Hypoglycemia 01/06/2021   Loss of appetite 01/06/2021   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   Peripheral neuropathy 01/06/2021   Sciatica 01/06/2021   Vitamin B12 deficiency 01/06/2021   Vitamin D deficiency 01/06/2021   Weakness 01/06/2021   Diabetes mellitus type 2 with neurological manifestations (HCC) 01/06/2021   Protein-calorie malnutrition, severe  12/26/2020   Acute blood loss anemia 12/26/2020   Prerenal azotemia 12/26/2020   Below-knee amputation of right lower extremity (HCC) 12/12/2020   Gangrene of right foot (HCC)    Diabetic neuropathy (HCC) 11/17/2020   Hyperglycemia due to type 2 diabetes mellitus (HCC) 11/17/2020   Long term (current) use of insulin (HCC) 11/17/2020   Obesity 11/17/2020   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/2019   Hip dislocation, right (HCC) 04/13/2019   Other intervertebral disc degeneration, lumbar region 03/30/2019   Coronary artery disease 01/30/2019   Tobacco abuse 01/30/2019   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   Burn, foot, second degree, left, initial encounter 06/08/2017   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 03/16/2017   S/P total knee arthroplasty, left 10/26/2016   Hyperlipidemia 09/04/2014   Thrombocytopenia (HCC)    Precordial chest pain 04/05/2014   Coronary atherosclerosis of  native coronary artery 10/01/2013   Other and unspecified hyperlipidemia 10/01/2013   Hypertension 10/01/2013   Type 2 diabetes mellitus with complication, without long-term current use of insulin (HCC) 10/01/2013   Esophageal reflux 10/01/2013   Hypertrophy of prostate without urinary obstruction and other lower urinary tract symptoms (LUTS) 10/01/2013   Past Medical History:  Diagnosis Date   Allergic rhinitis    Allergic rhinitis    Arthritis    Basal cell carcinoma 11/01/2019    bcc left chest treatment [place] [id number] [id number]    Chronic leg pain    right   Chronic lower back pain    Coronary artery disease    a. Stenting to RCA 2004; staged DES to LAD and Cx 2004. DES to [facility] 2012. b. DES to mCx, PTCA to dCx 11/2011. c. Lateral wall MI 2013 s/p PTCA to distal Cx & DES to mid OM2 11/2011. d. Low risk nuc 04/2014, EF wnl.   COVID-19    Diabetes mellitus    Insulin dependent   Diabetic neuropathy (HCC)    MILD   Diverticulosis    Dysrhythmia    [name] syndrome    Gout    right wrist;  right foot; right elbow; have had it since 1970's   H/O hiatal hernia    Heart murmur    History of echocardiogram    aortic sclerosis per echo 12/09 EF 65%, otherwise normal   History of hemorrhoids    BLEEDING   History of kidney stones    h/o   Hypertension    Diagnosed 1995    Myocardial infarction [facility])    Pancreatic pseudocyst    a. s/p remote drainage 2006.   Thrombocytopenia (HCC)    Seen on oldest labs in system from 2004   Vitamin B 12 deficiency    orally replaced    Family History  Problem Relation Age of Onset   Diabetes Mother    Hyperlipidemia Mother    Hypertension Mother    Cancer Father    Hypertension Father    Diabetes Sister    Hypertension Sister    Cancer Brother    Heart attack Neg Hx     Past Surgical History:  Procedure Laterality Date   ABDOMINAL AORTOGRAM W/LOWER EXTREMITY Bilateral 08/08/2020   Procedure: ABDOMINAL AORTOGRAM W/LOWER EXTREMITY;  Surgeon: Chuck Hint, MD;  Location: [facility] INVASIVE CV LAB;  Service: Cardiovascular;  Laterality: Bilateral;   AMPUTATION Right 12/12/2020   Procedure: RIGHT BELOW KNEE AMPUTATION;  Surgeon: Nadara Mustard, MD;  Location: [facility] OR;  Service: Orthopedics;  Laterality: Right;   BACK SURGERY     "total of 3 times" S/P fall    CARPAL TUNNEL RELEASE Bilateral    CHOLECYSTECTOMY  1990's   COLONOSCOPY     COLONOSCOPY Left 07/08/2023   Procedure: COLONOSCOPY;  Surgeon: Shellia Cleverly, DO;  Location: [facility] ENDOSCOPY;  Service: Gastroenterology;  Laterality: Left;   CORONARY ANGIOPLASTY  11/11/11   CORONARY ANGIOPLASTY WITH STENT PLACEMENT  09/30/2011   "1 then; makes a total of 4"   CORONARY ANGIOPLASTY WITH STENT PLACEMENT  11/11/11   "1; makes a total of 5"   ESOPHAGOGASTRODUODENOSCOPY Left 07/08/2023   Procedure: ESOPHAGOGASTRODUODENOSCOPY (EGD);  Surgeon: Shellia Cleverly, DO;  Location: [facility] ENDOSCOPY;  Service: Gastroenterology;  Laterality: Left;   HEMOSTASIS CLIP PLACEMENT  07/08/2023   Procedure:  HEMOSTASIS CLIP PLACEMENT;  Surgeon: Shellia Cleverly, DO;  Location: [facility] ENDOSCOPY;  Service: Gastroenterology;;   HOT HEMOSTASIS N/A  07/08/2023   Procedure: HOT HEMOSTASIS (ARGON PLASMA COAGULATION/BICAP);  Surgeon: Shellia Cleverly, DO;  Location: [facility] ENDOSCOPY;  Service: Gastroenterology;  Laterality: N/A;   INGUINAL HERNIA REPAIR  2003   right   JOINT REPLACEMENT Right 04/03/2002   hip replacment   KNEE ARTHROSCOPY  1990's   left   LEFT HEART CATH AND CORONARY ANGIOGRAPHY N/A 04/25/2023   Procedure: LEFT HEART CATH AND CORONARY ANGIOGRAPHY;  Surgeon: Marykay Lex, MD;  Location: [facility] INVASIVE CV LAB;  Service: Cardiovascular;  Laterality: N/A;   LEFT HEART CATHETERIZATION WITH CORONARY ANGIOGRAM N/A 09/30/2011   Procedure: LEFT HEART CATHETERIZATION WITH CORONARY ANGIOGRAM;  Surgeon: Corky Crafts, MD;  Location: [facility] CATH LAB;  Service: Cardiovascular;  Laterality: N/A;  possible PCI   LEFT HEART CATHETERIZATION WITH CORONARY ANGIOGRAM N/A 11/15/2011   Procedure: LEFT HEART CATHETERIZATION WITH CORONARY ANGIOGRAM;  Surgeon: Corky Crafts, MD;  Location: [facility] CATH LAB;  Service: Cardiovascular;  Laterality: N/A;   PERCUTANEOUS CORONARY STENT INTERVENTION (PCI-S)  09/30/2011   Procedure: PERCUTANEOUS CORONARY STENT INTERVENTION (PCI-S);  Surgeon: Corky Crafts, MD;  Location: [facility] CATH LAB;  Service: Cardiovascular;;   PERCUTANEOUS CORONARY STENT INTERVENTION (PCI-S) N/A 11/11/2011   Procedure: PERCUTANEOUS CORONARY STENT INTERVENTION (PCI-S);  Surgeon: Corky Crafts, MD;  Location: [facility] CATH LAB;  Service: Cardiovascular;  Laterality: N/A;   PERIPHERAL VASCULAR BALLOON ANGIOPLASTY Right 08/08/2020   Procedure: PERIPHERAL VASCULAR BALLOON ANGIOPLASTY;  Surgeon: Chuck Hint, MD;  Location: [facility] INVASIVE CV LAB;  Service: Cardiovascular;  Laterality: Right;  Posterior tibial    SHOULDER SURGERY Right    X 2   STUMP REVISION Right 01/09/2021   Procedure: REVISION RIGHT  BELOW KNEE AMPUTATION;  Surgeon: Nadara Mustard, MD;  Location: [facility] OR;  Service: Orthopedics;  Laterality: Right;   TONSILLECTOMY  ~ 1948   TOTAL HIP REVISION Right 04/13/2019   Procedure: RIGHT TOTAL HIP REVISION-POSTERIOR  APPROACH LATERAL;  Surgeon: Eldred Manges, MD;  Location: [facility] OR;  Service: Orthopedics;  Laterality: Right;   TOTAL KNEE ARTHROPLASTY Left 07/23/2016   Procedure: LEFT TOTAL KNEE ARTHROPLASTY;  Surgeon: Eldred Manges, MD;  Location: [facility] OR;  Service: Orthopedics;  Laterality: Left;   Social History   Occupational History   Occupation: Retired  Tobacco Use   Smoking status: Former   Smokeless tobacco: Current    Types: Chew   Tobacco comments:    quit 60 years ago  Vaping Use   Vaping status: Never Used  Substance and Sexual Activity   Alcohol use: No   Drug use: No   Sexual activity: Not Currently

## 2023-07-28 DIAGNOSIS — N471 Phimosis: Secondary | ICD-10-CM | POA: Diagnosis not present

## 2023-07-30 DIAGNOSIS — S72452D Displaced supracondylar fracture without intracondylar extension of lower end of left femur, subsequent encounter for closed fracture with routine healing: Secondary | ICD-10-CM | POA: Diagnosis not present

## 2023-07-30 DIAGNOSIS — K2971 Gastritis, unspecified, with bleeding: Secondary | ICD-10-CM | POA: Diagnosis not present

## 2023-07-30 DIAGNOSIS — E1165 Type 2 diabetes mellitus with hyperglycemia: Secondary | ICD-10-CM | POA: Diagnosis not present

## 2023-07-30 DIAGNOSIS — H353211 Exudative age-related macular degeneration, right eye, with active choroidal neovascularization: Secondary | ICD-10-CM | POA: Diagnosis not present

## 2023-07-30 DIAGNOSIS — E1151 Type 2 diabetes mellitus with diabetic peripheral angiopathy without gangrene: Secondary | ICD-10-CM | POA: Diagnosis not present

## 2023-07-30 DIAGNOSIS — M9702XD Periprosthetic fracture around internal prosthetic left hip joint, subsequent encounter: Secondary | ICD-10-CM | POA: Diagnosis not present

## 2023-08-02 DIAGNOSIS — E1151 Type 2 diabetes mellitus with diabetic peripheral angiopathy without gangrene: Secondary | ICD-10-CM | POA: Diagnosis not present

## 2023-08-02 DIAGNOSIS — E1165 Type 2 diabetes mellitus with hyperglycemia: Secondary | ICD-10-CM | POA: Diagnosis not present

## 2023-08-02 DIAGNOSIS — H353211 Exudative age-related macular degeneration, right eye, with active choroidal neovascularization: Secondary | ICD-10-CM | POA: Diagnosis not present

## 2023-08-02 DIAGNOSIS — M9702XD Periprosthetic fracture around internal prosthetic left hip joint, subsequent encounter: Secondary | ICD-10-CM | POA: Diagnosis not present

## 2023-08-02 DIAGNOSIS — S72452D Displaced supracondylar fracture without intracondylar extension of lower end of left femur, subsequent encounter for closed fracture with routine healing: Secondary | ICD-10-CM | POA: Diagnosis not present

## 2023-08-02 DIAGNOSIS — K2971 Gastritis, unspecified, with bleeding: Secondary | ICD-10-CM | POA: Diagnosis not present

## 2023-08-03 DIAGNOSIS — M9702XD Periprosthetic fracture around internal prosthetic left hip joint, subsequent encounter: Secondary | ICD-10-CM | POA: Diagnosis not present

## 2023-08-03 DIAGNOSIS — H353211 Exudative age-related macular degeneration, right eye, with active choroidal neovascularization: Secondary | ICD-10-CM | POA: Diagnosis not present

## 2023-08-03 DIAGNOSIS — E1151 Type 2 diabetes mellitus with diabetic peripheral angiopathy without gangrene: Secondary | ICD-10-CM | POA: Diagnosis not present

## 2023-08-03 DIAGNOSIS — S72452D Displaced supracondylar fracture without intracondylar extension of lower end of left femur, subsequent encounter for closed fracture with routine healing: Secondary | ICD-10-CM | POA: Diagnosis not present

## 2023-08-03 DIAGNOSIS — E1165 Type 2 diabetes mellitus with hyperglycemia: Secondary | ICD-10-CM | POA: Diagnosis not present

## 2023-08-03 DIAGNOSIS — K2971 Gastritis, unspecified, with bleeding: Secondary | ICD-10-CM | POA: Diagnosis not present

## 2023-08-04 DIAGNOSIS — H353211 Exudative age-related macular degeneration, right eye, with active choroidal neovascularization: Secondary | ICD-10-CM | POA: Diagnosis not present

## 2023-08-04 DIAGNOSIS — S72452D Displaced supracondylar fracture without intracondylar extension of lower end of left femur, subsequent encounter for closed fracture with routine healing: Secondary | ICD-10-CM | POA: Diagnosis not present

## 2023-08-04 DIAGNOSIS — I2510 Atherosclerotic heart disease of native coronary artery without angina pectoris: Secondary | ICD-10-CM | POA: Diagnosis not present

## 2023-08-04 DIAGNOSIS — N1831 Chronic kidney disease, stage 3a: Secondary | ICD-10-CM | POA: Diagnosis not present

## 2023-08-04 DIAGNOSIS — G4700 Insomnia, unspecified: Secondary | ICD-10-CM | POA: Diagnosis not present

## 2023-08-04 DIAGNOSIS — H353132 Nonexudative age-related macular degeneration, bilateral, intermediate dry stage: Secondary | ICD-10-CM | POA: Diagnosis not present

## 2023-08-04 DIAGNOSIS — E1122 Type 2 diabetes mellitus with diabetic chronic kidney disease: Secondary | ICD-10-CM | POA: Diagnosis not present

## 2023-08-04 DIAGNOSIS — E7800 Pure hypercholesterolemia, unspecified: Secondary | ICD-10-CM | POA: Diagnosis not present

## 2023-08-04 DIAGNOSIS — E1165 Type 2 diabetes mellitus with hyperglycemia: Secondary | ICD-10-CM | POA: Diagnosis not present

## 2023-08-04 DIAGNOSIS — N401 Enlarged prostate with lower urinary tract symptoms: Secondary | ICD-10-CM | POA: Diagnosis not present

## 2023-08-04 DIAGNOSIS — Z794 Long term (current) use of insulin: Secondary | ICD-10-CM | POA: Diagnosis not present

## 2023-08-04 DIAGNOSIS — I252 Old myocardial infarction: Secondary | ICD-10-CM | POA: Diagnosis not present

## 2023-08-04 DIAGNOSIS — I130 Hypertensive heart and chronic kidney disease with heart failure and stage 1 through stage 4 chronic kidney disease, or unspecified chronic kidney disease: Secondary | ICD-10-CM | POA: Diagnosis not present

## 2023-08-04 DIAGNOSIS — N138 Other obstructive and reflux uropathy: Secondary | ICD-10-CM | POA: Diagnosis not present

## 2023-08-04 DIAGNOSIS — D696 Thrombocytopenia, unspecified: Secondary | ICD-10-CM | POA: Diagnosis not present

## 2023-08-04 DIAGNOSIS — M9702XD Periprosthetic fracture around internal prosthetic left hip joint, subsequent encounter: Secondary | ICD-10-CM | POA: Diagnosis not present

## 2023-08-04 DIAGNOSIS — I255 Ischemic cardiomyopathy: Secondary | ICD-10-CM | POA: Diagnosis not present

## 2023-08-04 DIAGNOSIS — I480 Paroxysmal atrial fibrillation: Secondary | ICD-10-CM | POA: Diagnosis not present

## 2023-08-04 DIAGNOSIS — K2971 Gastritis, unspecified, with bleeding: Secondary | ICD-10-CM | POA: Diagnosis not present

## 2023-08-04 DIAGNOSIS — K5731 Diverticulosis of large intestine without perforation or abscess with bleeding: Secondary | ICD-10-CM | POA: Diagnosis not present

## 2023-08-04 DIAGNOSIS — Z89511 Acquired absence of right leg below knee: Secondary | ICD-10-CM | POA: Diagnosis not present

## 2023-08-04 DIAGNOSIS — I5043 Acute on chronic combined systolic (congestive) and diastolic (congestive) heart failure: Secondary | ICD-10-CM | POA: Diagnosis not present

## 2023-08-04 DIAGNOSIS — E1142 Type 2 diabetes mellitus with diabetic polyneuropathy: Secondary | ICD-10-CM | POA: Diagnosis not present

## 2023-08-04 DIAGNOSIS — D631 Anemia in chronic kidney disease: Secondary | ICD-10-CM | POA: Diagnosis not present

## 2023-08-04 DIAGNOSIS — E1151 Type 2 diabetes mellitus with diabetic peripheral angiopathy without gangrene: Secondary | ICD-10-CM | POA: Diagnosis not present

## 2023-08-09 DIAGNOSIS — E1142 Type 2 diabetes mellitus with diabetic polyneuropathy: Secondary | ICD-10-CM | POA: Diagnosis not present

## 2023-08-09 DIAGNOSIS — I130 Hypertensive heart and chronic kidney disease with heart failure and stage 1 through stage 4 chronic kidney disease, or unspecified chronic kidney disease: Secondary | ICD-10-CM | POA: Diagnosis not present

## 2023-08-09 DIAGNOSIS — K2971 Gastritis, unspecified, with bleeding: Secondary | ICD-10-CM | POA: Diagnosis not present

## 2023-08-09 DIAGNOSIS — E1151 Type 2 diabetes mellitus with diabetic peripheral angiopathy without gangrene: Secondary | ICD-10-CM | POA: Diagnosis not present

## 2023-08-09 DIAGNOSIS — I5043 Acute on chronic combined systolic (congestive) and diastolic (congestive) heart failure: Secondary | ICD-10-CM | POA: Diagnosis not present

## 2023-08-09 DIAGNOSIS — E1165 Type 2 diabetes mellitus with hyperglycemia: Secondary | ICD-10-CM | POA: Diagnosis not present

## 2023-08-12 ENCOUNTER — Ambulatory Visit (INDEPENDENT_AMBULATORY_CARE_PROVIDER_SITE_OTHER): Payer: Medicare Other | Admitting: Physician Assistant

## 2023-08-12 ENCOUNTER — Ambulatory Visit: Payer: Medicare Other | Attending: Physician Assistant | Admitting: Physician Assistant

## 2023-08-12 ENCOUNTER — Encounter: Payer: Self-pay | Admitting: Physician Assistant

## 2023-08-12 ENCOUNTER — Telehealth: Payer: Self-pay | Admitting: Physician Assistant

## 2023-08-12 VITALS — BP 112/59 | HR 67 | Ht 70.0 in | Wt 160.0 lb

## 2023-08-12 DIAGNOSIS — I2510 Atherosclerotic heart disease of native coronary artery without angina pectoris: Secondary | ICD-10-CM | POA: Diagnosis not present

## 2023-08-12 DIAGNOSIS — I10 Essential (primary) hypertension: Secondary | ICD-10-CM | POA: Diagnosis not present

## 2023-08-12 DIAGNOSIS — Z79899 Other long term (current) drug therapy: Secondary | ICD-10-CM | POA: Diagnosis not present

## 2023-08-12 DIAGNOSIS — E118 Type 2 diabetes mellitus with unspecified complications: Secondary | ICD-10-CM

## 2023-08-12 DIAGNOSIS — T148XXA Other injury of unspecified body region, initial encounter: Secondary | ICD-10-CM

## 2023-08-12 DIAGNOSIS — E785 Hyperlipidemia, unspecified: Secondary | ICD-10-CM

## 2023-08-12 MED ORDER — ISOSORBIDE MONONITRATE ER 60 MG PO TB24: 60.0000 mg | ORAL_TABLET | Freq: Every day | ORAL | 3 refills | Status: DC

## 2023-08-12 MED ORDER — ROSUVASTATIN CALCIUM 20 MG PO TABS: 20.0000 mg | ORAL_TABLET | Freq: Every day | ORAL | 3 refills | Status: DC

## 2023-08-12 MED ORDER — POTASSIUM CHLORIDE ER 20 MEQ PO TBCR: 1.0000 | EXTENDED_RELEASE_TABLET | Freq: Every day | ORAL | 3 refills | Status: DC

## 2023-08-12 MED ORDER — CLOPIDOGREL BISULFATE 75 MG PO TABS: 75.0000 mg | ORAL_TABLET | Freq: Every day | ORAL | 3 refills | Status: DC

## 2023-08-12 MED ORDER — METOPROLOL SUCCINATE ER 25 MG PO TB24: 25.0000 mg | ORAL_TABLET | Freq: Every day | ORAL | 3 refills | Status: DC

## 2023-08-12 MED ORDER — DOXYCYCLINE HYCLATE 100 MG PO TABS
100.0000 mg | ORAL_TABLET | Freq: Two times a day (BID) | ORAL | 0 refills | Status: DC
Start: 2023-08-12 — End: 2023-09-23

## 2023-08-12 MED ORDER — SACUBITRIL-VALSARTAN 24-26 MG PO TABS: 1.0000 | ORAL_TABLET | Freq: Two times a day (BID) | ORAL | 3 refills | Status: DC

## 2023-08-12 NOTE — Patient Instructions (Signed)
Medication Instructions:  Your physician recommends that you continue on your current medications as directed. Please refer to the Current Medication list given to you today.  *If you need a refill on your cardiac medications before your next appointment, please call your pharmacy*   Lab Work: None ordered  If you have labs (blood work) drawn today and your tests are completely normal, you will receive your results only by: MyChart Message (if you have MyChart) OR A paper copy in the mail If you have any lab test that is abnormal or we need to change your treatment, we will call you to review the results.   Testing/Procedures: None ordered   Follow-Up: At [facility], you and your health needs are our priority.  As part of our continuing mission to provide you with exceptional heart care, we have created designated Provider Care Teams.  These Care Teams include your primary Cardiologist (physician) and Advanced Practice Providers (APPs -  Physician Assistants and Nurse Practitioners) who all work together to provide you with the care you need, when you need it.  We recommend signing up for the patient portal called "MyChart".  Sign up information is provided on this After Visit Summary.  MyChart is used to connect with patients for Virtual Visits (Telemedicine).  Patients are able to view lab/test results, encounter notes, upcoming appointments, etc.  Non-urgent messages can be sent to your provider as well.   To learn more about what you can do with MyChart, go to [email or web address].    Your next appointment:   3 month(s)  Provider:   Jari Favre, PA-C         Other Instructions

## 2023-08-12 NOTE — Telephone Encounter (Signed)
Per Daughter pt saw Jari Favre today and she was to call back with a fax # [phone]  Dr Courtney Paris for clearance

## 2023-08-12 NOTE — Progress Notes (Signed)
Office Visit Note   Patient: Keith Espinoza           Date of Birth: Feb 21, 1941           MRN: [id number] Visit Date: 08/12/2023              Requested by: Emilio Aspen, MD [address] [place],  [place] [address] PCP: Emilio Aspen, MD  Chief Complaint  Patient presents with   Right Knee - Follow-up      HPI: Patient is a pleasant 82 year old gentleman who is a patient of Dr. Audrie Lia.  He is status post right below-knee amputation and revision done in 2022.  Most recently he was hospitalized for a left femur fracture.  He did develop a large blister at the end of the amputation stump and was seen by Dr. Lajoyce Corners.  He was advised to wear his shrinker.  He also has been recently diagnosed with congestive heart failure.  His wife wanted to be seen today because he has developed an additional very shallow ulcer with some redness on the lateral side of his stump.  Also has 1 on the tibial tubercle which his wife said he has had periodically before.  Denies any fever or chills  Assessment & Plan: Visit Diagnoses: Status post right below-knee amputation  Plan: He has no ascending cellulitis he has 2 very shallow 2 mm ulcers.  No foul odor just has edema right around these 2 areas.  I have emphasized the importance of not wearing his socket until he is healed.  Unfortunately he still nonweightbearing on the left side and says he has to wear just to go to the bathroom and for transfers.  Otherwise he is to wear shrinker at all times.  As an abundance of precaution I have given him a prescription for doxycycline he already has a follow-up scheduled with Dr. Lajoyce Corners if he has any concerns in the interim he may contact us readded immediately we will also continue daily cleansing with Dial soap and water  Follow-Up Instructions: No follow-ups on file.   Ortho Exam  Patient is alert, oriented, no adenopathy, well-dressed, normal affect, normal respiratory  effort. Examination no redness no swelling at the end of the stump.  Area of the blister has healed nicely.  He does have a area anteriorly and laterally over bony prominences has had a little skin breakdown with some surrounding erythema but does not ascend or descend.  Otherwise appears well.  Imaging: No results found. No images are attached to the encounter.  Labs: Lab Results  Component Value Date   HGBA1C 7.1 (A) 04/01/2023   HGBA1C 7.2 (A) 11/11/2022   HGBA1C 9.0 (A) 06/23/2022   REPTSTATUS 07/08/2023 FINAL 07/03/2023   GRAMSTAIN  04/13/2019    RARE WBC PRESENT, PREDOMINANTLY PMN NO ORGANISMS SEEN    CULT  07/03/2023    NO GROWTH 5 DAYS Performed at [facility], [address] [place], [place] [address]      Lab Results  Component Value Date   ALBUMIN 2.6 (L) 07/04/2023   ALBUMIN 2.3 (L) 07/03/2023   ALBUMIN 3.4 (L) 04/24/2023    Lab Results  Component Value Date   MG 2.0 07/08/2023   MG 1.9 07/07/2023   MG 1.8 07/06/2023   No results found for: "VD25OH"  No results found for: "PREALBUMIN"    Latest Ref Rng & Units 07/10/2023    2:27 AM 07/09/2023    2:25  AM 07/08/2023    2:21 AM  CBC EXTENDED  WBC 4.0 - 10.5 K/uL 6.4  8.6  7.1   RBC 4.22 - 5.81 MIL/uL 3.32  3.50  3.76   Hemoglobin 13.0 - 17.0 g/dL 9.6  9.7  16.1   HCT 09.6 - 52.0 % 30.5  31.4  33.2   Platelets 150 - 400 K/uL 110  141  129   NEUT# 1.7 - 7.7 K/uL  6.2    Lymph# 0.7 - 4.0 K/uL  1.3       There is no height or weight on file to calculate BMI.  Orders:  No orders of the defined types were placed in this encounter.  No orders of the defined types were placed in this encounter.    Procedures: No procedures performed  Clinical Data: No additional findings.  ROS:  All other systems negative, except as noted in the HPI. Review of Systems  Objective: Vital Signs: There were no vitals taken for this visit.  Specialty Comments:  No specialty comments available.  PMFS  History: Patient Active Problem List   Diagnosis Date Noted   Acute on chronic combined systolic and diastolic HF (heart failure) (HCC) 07/09/2023   Gastric AVM 07/08/2023   Diverticulosis of colon without hemorrhage 07/08/2023   Gastrointestinal hemorrhage associated with acute gastritis 07/08/2023   PSVT (paroxysmal supraventricular tachycardia) (HCC) 07/08/2023   Paroxysmal A-fib (HCC) 07/08/2023   Grade I diastolic dysfunction 07/08/2023   Acute on chronic anemia 07/07/2023   New onset atrial fibrillation (HCC) 07/07/2023   Ischemic cardiomyopathy 07/07/2023   Acute systolic CHF (congestive heart failure) (HCC) 07/07/2023   Pressure injury of skin 07/07/2023   Blister 07/06/2023   Edema of both lower extremities 07/05/2023   CHF exacerbation (HCC) 07/05/2023   Iron deficiency anemia 07/05/2023   Shortness of breath 07/03/2023   Finger numbness 05/05/2023   Elevated troponin 04/25/2023   Chest pain of uncertain etiology 04/23/2023   Unstable angina (HCC) 04/23/2023   AKI (acute kidney injury) (HCC) 04/23/2023   History of CAD (coronary artery disease) 04/23/2023   Contusion of right hip 03/08/2023   Type 2 diabetes mellitus with hyperglycemia, with long-term current use of insulin (HCC) 02/15/2022   Adenoma of left adrenal gland 02/15/2022   History of right below knee amputation (HCC) 10/06/2021   Exudative age-related macular degeneration of left eye with active choroidal neovascularization (HCC) 07/28/2021   Exudative age-related macular degeneration of right eye with active choroidal neovascularization (HCC) 06/01/2021   Intermediate stage nonexudative age-related macular degeneration of both eyes 06/01/2021   Type 2 diabetes mellitus with diabetic polyneuropathy, with long-term current use of insulin (HCC) 03/24/2021   Constipation 02/02/2021   Fecal impaction (HCC) 02/01/2021   Wound dehiscence 01/09/2021   Dehiscence of amputation stump (HCC)    Status post  percutaneous transluminal coronary angioplasty 01/06/2021   Type II diabetes mellitus, uncontrolled 01/06/2021   Acute pancreatitis 01/06/2021   Diabetic peripheral vascular disease (HCC) 01/06/2021   Encounter for screening for other disorder 01/06/2021   Enlarged prostate 01/06/2021   Foot ulcer, right (HCC) 01/06/2021   Gout 01/06/2021   Headache 01/06/2021   Neck pain 01/06/2021   Hypoglycemia 01/06/2021   Loss of appetite 01/06/2021   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   Peripheral neuropathy 01/06/2021   Sciatica 01/06/2021   Vitamin B12 deficiency 01/06/2021   Vitamin D deficiency 01/06/2021   Weakness 01/06/2021   Diabetes mellitus type 2 with neurological manifestations (HCC) 01/06/2021  Protein-calorie malnutrition, severe 12/26/2020   Acute blood loss anemia 12/26/2020   Prerenal azotemia 12/26/2020   Below-knee amputation of right lower extremity (HCC) 12/12/2020   Gangrene of right foot (HCC)    Diabetic neuropathy (HCC) 11/17/2020   Hyperglycemia due to type 2 diabetes mellitus (HCC) 11/17/2020   Long term (current) use of insulin (HCC) 11/17/2020   Obesity 11/17/2020   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/2019   Hip dislocation, right (HCC) 04/13/2019   Other intervertebral disc degeneration, lumbar region 03/30/2019   Coronary artery disease 01/30/2019   Tobacco abuse 01/30/2019   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   Burn, foot, second degree, left, initial encounter 06/08/2017   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 03/16/2017   S/P total knee arthroplasty, left 10/26/2016   Hyperlipidemia 09/04/2014   Thrombocytopenia (HCC)    Precordial chest pain 04/05/2014   Coronary atherosclerosis of native coronary artery 10/01/2013   Other and unspecified hyperlipidemia 10/01/2013   Hypertension 10/01/2013   Type 2 diabetes mellitus with complication, without long-term current use of insulin (HCC) 10/01/2013   Esophageal reflux  10/01/2013   Hypertrophy of prostate without urinary obstruction and other lower urinary tract symptoms (LUTS) 10/01/2013   Past Medical History:  Diagnosis Date   Allergic rhinitis    Allergic rhinitis    Arthritis    Basal cell carcinoma 11/01/2019    bcc left chest treatment TX [id number] [id number]    Chronic leg pain    right   Chronic lower back pain    Coronary artery disease    a. Stenting to RCA 2004; staged DES to LAD and Cx 2004. DES to [facility] 2012. b. DES to mCx, PTCA to dCx 11/2011. c. Lateral wall MI 2013 s/p PTCA to distal Cx & DES to mid OM2 11/2011. d. Low risk nuc 04/2014, EF wnl.   COVID-19    Diabetes mellitus    Insulin dependent   Diabetic neuropathy (HCC)    MILD   Diverticulosis    Dysrhythmia    [name] syndrome    Gout    right wrist; right foot; right elbow; have had it since 1970's   H/O hiatal hernia    Heart murmur    History of echocardiogram    aortic sclerosis per echo 12/09 EF 65%, otherwise normal   History of hemorrhoids    BLEEDING   History of kidney stones    h/o   Hypertension    Diagnosed 1995    Myocardial infarction [facility])    Pancreatic pseudocyst    a. s/p remote drainage 2006.   Thrombocytopenia (HCC)    Seen on oldest labs in system from 2004   Vitamin B 12 deficiency    orally replaced    Family History  Problem Relation Age of Onset   Diabetes Mother    Hyperlipidemia Mother    Hypertension Mother    Cancer Father    Hypertension Father    Diabetes Sister    Hypertension Sister    Cancer Brother    Heart attack Neg Hx     Past Surgical History:  Procedure Laterality Date   ABDOMINAL AORTOGRAM W/LOWER EXTREMITY Bilateral 08/08/2020   Procedure: ABDOMINAL AORTOGRAM W/LOWER EXTREMITY;  Surgeon: Chuck Hint, MD;  Location: [facility] INVASIVE CV LAB;  Service: Cardiovascular;  Laterality: Bilateral;   AMPUTATION Right 12/12/2020   Procedure: RIGHT BELOW KNEE AMPUTATION;  Surgeon: Nadara Mustard, MD;  Location: [facility] OR;  Service:  Orthopedics;  Laterality: Right;  BACK SURGERY     "total of 3 times" S/P fall    CARPAL TUNNEL RELEASE Bilateral    CHOLECYSTECTOMY  1990's   COLONOSCOPY     COLONOSCOPY Left 07/08/2023   Procedure: COLONOSCOPY;  Surgeon: Shellia Cleverly, DO;  Location: [facility] ENDOSCOPY;  Service: Gastroenterology;  Laterality: Left;   CORONARY ANGIOPLASTY  11/11/11   CORONARY ANGIOPLASTY WITH STENT PLACEMENT  09/30/2011   "1 then; makes a total of 4"   CORONARY ANGIOPLASTY WITH STENT PLACEMENT  11/11/11   "1; makes a total of 5"   ESOPHAGOGASTRODUODENOSCOPY Left 07/08/2023   Procedure: ESOPHAGOGASTRODUODENOSCOPY (EGD);  Surgeon: Shellia Cleverly, DO;  Location: [facility] ENDOSCOPY;  Service: Gastroenterology;  Laterality: Left;   HEMOSTASIS CLIP PLACEMENT  07/08/2023   Procedure: HEMOSTASIS CLIP PLACEMENT;  Surgeon: Shellia Cleverly, DO;  Location: [facility] ENDOSCOPY;  Service: Gastroenterology;;   HOT HEMOSTASIS N/A 07/08/2023   Procedure: HOT HEMOSTASIS (ARGON PLASMA COAGULATION/BICAP);  Surgeon: Shellia Cleverly, DO;  Location: [facility] ENDOSCOPY;  Service: Gastroenterology;  Laterality: N/A;   INGUINAL HERNIA REPAIR  2003   right   JOINT REPLACEMENT Right 04/03/2002   hip replacment   KNEE ARTHROSCOPY  1990's   left   LEFT HEART CATH AND CORONARY ANGIOGRAPHY N/A 04/25/2023   Procedure: LEFT HEART CATH AND CORONARY ANGIOGRAPHY;  Surgeon: Marykay Lex, MD;  Location: [facility] INVASIVE CV LAB;  Service: Cardiovascular;  Laterality: N/A;   LEFT HEART CATHETERIZATION WITH CORONARY ANGIOGRAM N/A 09/30/2011   Procedure: LEFT HEART CATHETERIZATION WITH CORONARY ANGIOGRAM;  Surgeon: Corky Crafts, MD;  Location: [facility] CATH LAB;  Service: Cardiovascular;  Laterality: N/A;  possible PCI   LEFT HEART CATHETERIZATION WITH CORONARY ANGIOGRAM N/A 11/15/2011   Procedure: LEFT HEART CATHETERIZATION WITH CORONARY ANGIOGRAM;  Surgeon: Corky Crafts, MD;  Location: [facility] CATH LAB;  Service: Cardiovascular;  Laterality: N/A;    PERCUTANEOUS CORONARY STENT INTERVENTION (PCI-S)  09/30/2011   Procedure: PERCUTANEOUS CORONARY STENT INTERVENTION (PCI-S);  Surgeon: Corky Crafts, MD;  Location: [facility] CATH LAB;  Service: Cardiovascular;;   PERCUTANEOUS CORONARY STENT INTERVENTION (PCI-S) N/A 11/11/2011   Procedure: PERCUTANEOUS CORONARY STENT INTERVENTION (PCI-S);  Surgeon: Corky Crafts, MD;  Location: [facility] CATH LAB;  Service: Cardiovascular;  Laterality: N/A;   PERIPHERAL VASCULAR BALLOON ANGIOPLASTY Right 08/08/2020   Procedure: PERIPHERAL VASCULAR BALLOON ANGIOPLASTY;  Surgeon: Chuck Hint, MD;  Location: [facility] INVASIVE CV LAB;  Service: Cardiovascular;  Laterality: Right;  Posterior tibial    SHOULDER SURGERY Right    X 2   STUMP REVISION Right 01/09/2021   Procedure: REVISION RIGHT BELOW KNEE AMPUTATION;  Surgeon: Nadara Mustard, MD;  Location: [facility] OR;  Service: Orthopedics;  Laterality: Right;   TONSILLECTOMY  ~ 1948   TOTAL HIP REVISION Right 04/13/2019   Procedure: RIGHT TOTAL HIP REVISION-POSTERIOR  APPROACH LATERAL;  Surgeon: Eldred Manges, MD;  Location: [facility] OR;  Service: Orthopedics;  Laterality: Right;   TOTAL KNEE ARTHROPLASTY Left 07/23/2016   Procedure: LEFT TOTAL KNEE ARTHROPLASTY;  Surgeon: Eldred Manges, MD;  Location: [facility] OR;  Service: Orthopedics;  Laterality: Left;   Social History   Occupational History   Occupation: Retired  Tobacco Use   Smoking status: Former   Smokeless tobacco: Current    Types: Chew   Tobacco comments:    quit 60 years ago  Vaping Use   Vaping status: Never Used  Substance and Sexual Activity   Alcohol use: No   Drug use: No   Sexual activity:  Not Currently

## 2023-08-12 NOTE — Progress Notes (Signed)
Cardiology Office Note:  .   Date:  08/12/2023  ID:  Keith Espinoza, DOB [birth year], MRN [id number] PCP: Emilio Aspen, MD  [facility] Providers Cardiologist:  Lance Muss, MD { ``````````````````````````````` History of Present Illness: .   Dougals Dolph is a 82 y.o. male with a past medical history of CAD status post stenting of the RCA in 2004 with staged DES to LAD and circumflex 2004, DES to RCA 2012, DES to mid circumflex, PTCA to distal circumflex 2013 and low risk NST 04/2014 here for follow-up appointment.  Was advised to stop chewing tobacco.  Had COVID January 2022 with no hospital stay required.  History includes leg amputation back in March 2022 and unfortunately his wife passed with the following month April 2022.  He received a prosthetic leg and has been utilizing this.  Cardiac catheterization was performed in 2024 for unstable angina which is detailed below.  No stents were placed and he was diagnosed with severe multivessel CAD and was evaluated for surgery but turned down.  At his last visit with Dr. Eldridge Dace in July of this year.  His cardiac catheterization findings were discussed at length.  Unfortunately, no options for PCI.  His left ventricular function was normal.  Management was well-controlled.  The biggest trigger for chest discomfort is emotional stress.  Plan was to continue with medical therapy.  Both Dr. Eldridge Dace and the patient agreed that he is not a candidate for bypass surgery.  Today,  He presents with a history of coronary disease, diabetes, and congestive heart failure, has been managing well with no recent chest pain or shortness of breath. The patient's blood sugar levels have been well-controlled at home, with a recent reading of 123. The patient's cholesterol levels are also well-managed, with a recent LDL of 36 and triglycerides of 106.  The patient has been non-ambulatory for the past three months due to a broken  femur. The patient also has some swelling in the legs, which the patient's daughter has been managing by adjusting the patient's furosemide dosage as needed.Recent left amputation for dry gangrene by Dr. Lajoyce Corners.    The patient's daughter is arranging for the preoperative clearance for circumcision.   Reports no shortness of breath nor dyspnea on exertion. Reports no chest pain, pressure, or tightness. No edema, orthopnea, PND. Reports no palpitations.         ROS: Pertinent ROS in HPI  Studies Reviewed: .       Cardiac cath 04/25/23 Mid LAD-1 lesion is 75% stenosed with 70% stenosed side branch in 1st Diag. - just prior to stent.  Mid LAD-2 STENT is 40% stenosed.  Mid LAD-3 lesion is 75% stenosed with 60% stenosed side branch in 2nd Diag at distal edge of STENT   1st Diag lesion is 90% stenosed.   Prox Cx to Mid Cx STENT segment is 45% stenosed with 90% stenosed side branch in 4th Mrg.  Mid Cx to Dist Cx lesion is 95% stenosed just after stent   Dist Cx-1 lesion is 95% stenosed prior to stent. Previously placed Dist Cx-2 DES STENT is widely patent.   Prox RCA-1 lesion is 70% stenosed.  Prox RCA-2 lesion is 85% stenosed. Prox RCA-3 lesion is 80% stenosed. Prox RCA to Mid RCA lesion is 95% stenosed.   Mid RCA STENT is 35% stenosed.   RPAV lesion is 60% stenosed.   RPDA lesion is 80% stenosed.   There is no aortic valve stenosis.  Severe MV CAD - with moderate ISR in LAD/RCA & LCx but each stent with stent edge lesions.  "   He was evaluated for surgery but turned down.  He is here to discuss further options.      Physical Exam:   VS:  BP (!) 112/59   Pulse 67   Ht 5\' 10"  (1.778 m)   Wt 160 lb (72.6 kg)   SpO2 97%   BMI 22.96 kg/m    Wt Readings from Last 3 Encounters:  08/12/23 160 lb (72.6 kg)  07/10/23 162 lb 4.1 oz (73.6 kg)  06/23/23 164 lb (74.4 kg)    GEN: Well nourished, well developed in no acute distress NECK: No JVD; No carotid bruits CARDIAC: RRR, no murmurs, rubs,  gallops RESPIRATORY:  Clear to auscultation without rales, wheezing or rhonchi  ABDOMEN: Soft, non-tender, non-distended EXTREMITIES:  No edema; No deformity   ASSESSMENT AND PLAN: .    Coronary Artery Disease Stable, no chest pain or shortness of breath. Medication management due to multiple narrowed areas and not a good surgical candidate. -Continue current medication regimen with Plavix 75 mg daily, Jardiance 25 mg daily, Lasix 20 mg daily, Imdur 60 mg daily, metoprolol succinate 25 mg daily, nitro as needed, fish oil 1200 mg daily, potassium 20 mEq daily, Crestor 20 mg daily, Entresto 24-26 mg twice a day.  Diabetes Mellitus Blood glucose well-controlled at home, last A1C was 7.1 in June. -Continue current diabetes management.  Hyperlipidemia LDL and triglycerides well-controlled, LDL was 36 and triglycerides were 106 on recent labs. -Continue current lipid management.  Congestive Heart Failure Some swelling noted, currently on 20mg  of Furosemide but caregiver has been increasing to 40mg  due to swelling. -Increase Furosemide to 20mg  daily, with an additional 20mg  as needed for swelling.  Preoperative Clearance  Mr. Meyering perioperative risk of a major cardiac event is 6.6% according to the Revised Cardiac Risk Index (RCRI).  Therefore, he is at high risk for perioperative complications.   His functional capacity is poor at 3.41 METs according to the Duke Activity Status Index (DASI). Recommendations: The patient is at high risk for perioperative cardiac complications and is at a low functional capacity.  However, further testing will not change how his cardiac status is managed.  Proceed with surgery at high risk if there are no other options for treatment. Antiplatelet and/or Anticoagulation Recommendations: Clopidogrel (Plavix) can be held for 5-7 days prior to his surgery and resumed as soon as possible post op.  Would start aspirin 81 mg while off Plavix if bleeding risk is not  prohibitive.  Patient scheduled for circumcision. Patient is high risk due to coronary disease but is well controlled on medications. -Clear patient for surgery with understanding of high risk status. He will need official clearance from requesting office. -Stop Plavix 5-7 days prior to surgery as per standard preoperative protocol.  Lower Extremity Amputation/Lower Extremity Fracture Patient has had recent lower extremity amputation and is currently non-ambulatory. Follow-up appointment scheduled to assess weight-bearing status.  -Continue current care and follow-up as scheduled. -Patient has a recent femur fracture in the other leg, currently non-weight bearing. -Continue current care and follow-up as scheduled.     Dispo: He can follow-up in 3 months with myself in 6 months with Dr. Anne Fu  Signed, Sharlene Dory, PA-C

## 2023-08-15 NOTE — Telephone Encounter (Signed)
I s/w [facility] as to I recognized the fax # was [facility]. I confirmed that yes they were seeking cardiac clearance. I asked for a clearance request to be faxed to our pre op team fax # [phone] attn: pre op team.

## 2023-08-16 DIAGNOSIS — Z96659 Presence of unspecified artificial knee joint: Secondary | ICD-10-CM | POA: Diagnosis not present

## 2023-08-16 DIAGNOSIS — M978XXD Periprosthetic fracture around other internal prosthetic joint, subsequent encounter: Secondary | ICD-10-CM | POA: Diagnosis not present

## 2023-08-16 NOTE — Telephone Encounter (Signed)
Our office is still awaiting on a clearance request to come over from Dr. Lafonda Mosses office.

## 2023-08-17 NOTE — Telephone Encounter (Signed)
I s/w [facility] to f/u about needing a clearance request to be faxed to our office. I have left detailed message again that we will need a clearance request top be faxed to our office fax# [phone] attn: pre op team.

## 2023-08-18 DIAGNOSIS — E1151 Type 2 diabetes mellitus with diabetic peripheral angiopathy without gangrene: Secondary | ICD-10-CM | POA: Diagnosis not present

## 2023-08-18 DIAGNOSIS — I5043 Acute on chronic combined systolic (congestive) and diastolic (congestive) heart failure: Secondary | ICD-10-CM | POA: Diagnosis not present

## 2023-08-18 DIAGNOSIS — I130 Hypertensive heart and chronic kidney disease with heart failure and stage 1 through stage 4 chronic kidney disease, or unspecified chronic kidney disease: Secondary | ICD-10-CM | POA: Diagnosis not present

## 2023-08-18 DIAGNOSIS — E1165 Type 2 diabetes mellitus with hyperglycemia: Secondary | ICD-10-CM | POA: Diagnosis not present

## 2023-08-18 DIAGNOSIS — E1142 Type 2 diabetes mellitus with diabetic polyneuropathy: Secondary | ICD-10-CM | POA: Diagnosis not present

## 2023-08-18 DIAGNOSIS — K2971 Gastritis, unspecified, with bleeding: Secondary | ICD-10-CM | POA: Diagnosis not present

## 2023-08-18 NOTE — Telephone Encounter (Signed)
I have tried x 3 to reach someone are [facility] about a clearance request to be faxed to our office. I have messages x 3 with fax # [phone].   I will update Keith Espinoza, PAC.   I am going to remove from the preop call back pool at this time. I will fax notes to [facility] asking for the clearance request to be faxed to our office.

## 2023-08-23 ENCOUNTER — Ambulatory Visit (INDEPENDENT_AMBULATORY_CARE_PROVIDER_SITE_OTHER): Payer: Medicare Other | Admitting: Orthopedic Surgery

## 2023-08-23 DIAGNOSIS — Z89511 Acquired absence of right leg below knee: Secondary | ICD-10-CM

## 2023-08-24 DIAGNOSIS — E1142 Type 2 diabetes mellitus with diabetic polyneuropathy: Secondary | ICD-10-CM | POA: Diagnosis not present

## 2023-08-24 DIAGNOSIS — E1151 Type 2 diabetes mellitus with diabetic peripheral angiopathy without gangrene: Secondary | ICD-10-CM | POA: Diagnosis not present

## 2023-08-24 DIAGNOSIS — K2971 Gastritis, unspecified, with bleeding: Secondary | ICD-10-CM | POA: Diagnosis not present

## 2023-08-24 DIAGNOSIS — I5043 Acute on chronic combined systolic (congestive) and diastolic (congestive) heart failure: Secondary | ICD-10-CM | POA: Diagnosis not present

## 2023-08-24 DIAGNOSIS — I130 Hypertensive heart and chronic kidney disease with heart failure and stage 1 through stage 4 chronic kidney disease, or unspecified chronic kidney disease: Secondary | ICD-10-CM | POA: Diagnosis not present

## 2023-08-24 DIAGNOSIS — E1165 Type 2 diabetes mellitus with hyperglycemia: Secondary | ICD-10-CM | POA: Diagnosis not present

## 2023-08-31 DIAGNOSIS — E1151 Type 2 diabetes mellitus with diabetic peripheral angiopathy without gangrene: Secondary | ICD-10-CM | POA: Diagnosis not present

## 2023-08-31 DIAGNOSIS — E1142 Type 2 diabetes mellitus with diabetic polyneuropathy: Secondary | ICD-10-CM | POA: Diagnosis not present

## 2023-08-31 DIAGNOSIS — I130 Hypertensive heart and chronic kidney disease with heart failure and stage 1 through stage 4 chronic kidney disease, or unspecified chronic kidney disease: Secondary | ICD-10-CM | POA: Diagnosis not present

## 2023-08-31 DIAGNOSIS — K2971 Gastritis, unspecified, with bleeding: Secondary | ICD-10-CM | POA: Diagnosis not present

## 2023-08-31 DIAGNOSIS — I5043 Acute on chronic combined systolic (congestive) and diastolic (congestive) heart failure: Secondary | ICD-10-CM | POA: Diagnosis not present

## 2023-08-31 DIAGNOSIS — E1165 Type 2 diabetes mellitus with hyperglycemia: Secondary | ICD-10-CM | POA: Diagnosis not present

## 2023-09-05 ENCOUNTER — Encounter: Payer: Self-pay | Admitting: Orthopedic Surgery

## 2023-09-05 NOTE — Progress Notes (Signed)
Office Visit Note   Patient: Keith Espinoza           Date of Birth: Mar 24, 1941           MRN: [id number] Visit Date: 08/23/2023              Requested by: Emilio Aspen, MD [address] [place],  [place] [address] PCP: Emilio Aspen, MD  Chief Complaint  Patient presents with   Right Leg - Wound Check    Hx right BKA      HPI: Patient is an 82 year old gentleman who is seen in follow-up for right below-knee amputation with ulceration.  Patient is currently on doxycycline and using his shrinker.  Patient is only using his prosthesis for transfers at this time.  Assessment & Plan: Visit Diagnoses:  1. Right below-knee amputee (HCC)     Plan: Recommended DuoDERM over the pressure areas to decrease the use of his leg.  Follow-Up Instructions: Return in about 4 weeks (around 09/20/2023).   Ortho Exam  Patient is alert, oriented, no adenopathy, well-dressed, normal affect, normal respiratory effort. Examination there is a ischemic ulcers over the tibial crest that is 5 mm in diameter and a ischemic ulcer over the fibular head which is 1 cm diameter there is thin eschar.  No cellulitis no drainage.  Imaging: No results found. No images are attached to the encounter.  Labs: Lab Results  Component Value Date   HGBA1C 7.1 (A) 04/01/2023   HGBA1C 7.2 (A) 11/11/2022   HGBA1C 9.0 (A) 06/23/2022   REPTSTATUS 07/08/2023 FINAL 07/03/2023   GRAMSTAIN  04/13/2019    RARE WBC PRESENT, PREDOMINANTLY PMN NO ORGANISMS SEEN    CULT  07/03/2023    NO GROWTH 5 DAYS Performed at [facility], [address], [place], [place] [address]      Lab Results  Component Value Date   ALBUMIN 2.6 (L) 07/04/2023   ALBUMIN 2.3 (L) 07/03/2023   ALBUMIN 3.4 (L) 04/24/2023    Lab Results  Component Value Date   MG 2.0 07/08/2023   MG 1.9 07/07/2023   MG 1.8 07/06/2023   No results found for: "VD25OH"  No results found for:  "PREALBUMIN"    Latest Ref Rng & Units 07/10/2023    2:27 AM 07/09/2023    2:25 AM 07/08/2023    2:21 AM  CBC EXTENDED  WBC 4.0 - 10.5 K/uL 6.4  8.6  7.1   RBC 4.22 - 5.81 MIL/uL 3.32  3.50  3.76   Hemoglobin 13.0 - 17.0 g/dL 9.6  9.7  03.4   HCT 74.2 - 52.0 % 30.5  31.4  33.2   Platelets 150 - 400 K/uL 110  141  129   NEUT# 1.7 - 7.7 K/uL  6.2    Lymph# 0.7 - 4.0 K/uL  1.3       There is no height or weight on file to calculate BMI.  Orders:  No orders of the defined types were placed in this encounter.  No orders of the defined types were placed in this encounter.    Procedures: No procedures performed  Clinical Data: No additional findings.  ROS:  All other systems negative, except as noted in the HPI. Review of Systems  Objective: Vital Signs: There were no vitals taken for this visit.  Specialty Comments:  No specialty comments available.  PMFS History: Patient Active Problem List   Diagnosis Date Noted   Acute on  chronic combined systolic and diastolic HF (heart failure) (HCC) 07/09/2023   Gastric AVM 07/08/2023   Diverticulosis of colon without hemorrhage 07/08/2023   Gastrointestinal hemorrhage associated with acute gastritis 07/08/2023   PSVT (paroxysmal supraventricular tachycardia) (HCC) 07/08/2023   Paroxysmal A-fib (HCC) 07/08/2023   Grade I diastolic dysfunction 07/08/2023   Acute on chronic anemia 07/07/2023   New onset atrial fibrillation (HCC) 07/07/2023   Ischemic cardiomyopathy 07/07/2023   Acute systolic CHF (congestive heart failure) (HCC) 07/07/2023   Pressure injury of skin 07/07/2023   Blister 07/06/2023   Edema of both lower extremities 07/05/2023   CHF exacerbation (HCC) 07/05/2023   Iron deficiency anemia 07/05/2023   Shortness of breath 07/03/2023   Finger numbness 05/05/2023   Elevated troponin 04/25/2023   Chest pain of uncertain etiology 04/23/2023   Unstable angina (HCC) 04/23/2023   AKI (acute kidney injury) (HCC)  04/23/2023   History of CAD (coronary artery disease) 04/23/2023   Contusion of right hip 03/08/2023   Type 2 diabetes mellitus with hyperglycemia, with long-term current use of insulin (HCC) 02/15/2022   Adenoma of left adrenal gland 02/15/2022   History of right below knee amputation (HCC) 10/06/2021   Exudative age-related macular degeneration of left eye with active choroidal neovascularization (HCC) 07/28/2021   Exudative age-related macular degeneration of right eye with active choroidal neovascularization (HCC) 06/01/2021   Intermediate stage nonexudative age-related macular degeneration of both eyes 06/01/2021   Type 2 diabetes mellitus with diabetic polyneuropathy, with long-term current use of insulin (HCC) 03/24/2021   Constipation 02/02/2021   Fecal impaction (HCC) 02/01/2021   Wound dehiscence 01/09/2021   Dehiscence of amputation stump (HCC)    Status post percutaneous transluminal coronary angioplasty 01/06/2021   Type II diabetes mellitus, uncontrolled 01/06/2021   Acute pancreatitis 01/06/2021   Diabetic peripheral vascular disease (HCC) 01/06/2021   Encounter for screening for other disorder 01/06/2021   Enlarged prostate 01/06/2021   Foot ulcer, right (HCC) 01/06/2021   Gout 01/06/2021   Headache 01/06/2021   Neck pain 01/06/2021   Hypoglycemia 01/06/2021   Loss of appetite 01/06/2021   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   Peripheral neuropathy 01/06/2021   Sciatica 01/06/2021   Vitamin B12 deficiency 01/06/2021   Vitamin D deficiency 01/06/2021   Weakness 01/06/2021   Diabetes mellitus type 2 with neurological manifestations (HCC) 01/06/2021   Protein-calorie malnutrition, severe 12/26/2020   Acute blood loss anemia 12/26/2020   Prerenal azotemia 12/26/2020   Below-knee amputation of right lower extremity (HCC) 12/12/2020   Gangrene of right foot (HCC)    Diabetic neuropathy (HCC) 11/17/2020   Hyperglycemia due to type 2 diabetes mellitus (HCC)  11/17/2020   Long term (current) use of insulin (HCC) 11/17/2020   Obesity 11/17/2020   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/2019   Hip dislocation, right (HCC) 04/13/2019   Other intervertebral disc degeneration, lumbar region 03/30/2019   Coronary artery disease 01/30/2019   Tobacco abuse 01/30/2019   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   Burn, foot, second degree, left, initial encounter 06/08/2017   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 03/16/2017   S/P total knee arthroplasty, left 10/26/2016   Hyperlipidemia 09/04/2014   Thrombocytopenia (HCC)    Precordial chest pain 04/05/2014   Coronary atherosclerosis of native coronary artery 10/01/2013   Other and unspecified hyperlipidemia 10/01/2013   Hypertension 10/01/2013   Type 2 diabetes mellitus with complication, without long-term current use of insulin (HCC) 10/01/2013   Esophageal reflux 10/01/2013   Hypertrophy of prostate  without urinary obstruction and other lower urinary tract symptoms (LUTS) 10/01/2013   Past Medical History:  Diagnosis Date   Allergic rhinitis    Allergic rhinitis    Arthritis    Basal cell carcinoma 11/01/2019    bcc left chest treatment [place] cx3 [id number]    Chronic leg pain    right   Chronic lower back pain    Coronary artery disease    a. Stenting to RCA 2004; staged DES to LAD and Cx 2004. DES to [facility] 2012. b. DES to mCx, PTCA to dCx 11/2011. c. Lateral wall MI 2013 s/p PTCA to distal Cx & DES to mid OM2 11/2011. d. Low risk nuc 04/2014, EF wnl.   COVID-19    Diabetes mellitus    Insulin dependent   Diabetic neuropathy (HCC)    MILD   Diverticulosis    Dysrhythmia    [name] syndrome    Gout    right wrist; right foot; right elbow; have had it since 1970's   H/O hiatal hernia    Heart murmur    History of echocardiogram    aortic sclerosis per echo 12/09 EF 65%, otherwise normal   History of hemorrhoids    BLEEDING   History of kidney stones    h/o   Hypertension     Diagnosed 1995    Myocardial infarction [facility])    Pancreatic pseudocyst    a. s/p remote drainage 2006.   Thrombocytopenia (HCC)    Seen on oldest labs in system from 2004   Vitamin B 12 deficiency    orally replaced    Family History  Problem Relation Age of Onset   Diabetes Mother    Hyperlipidemia Mother    Hypertension Mother    Cancer Father    Hypertension Father    Diabetes Sister    Hypertension Sister    Cancer Brother    Heart attack Neg Hx     Past Surgical History:  Procedure Laterality Date   ABDOMINAL AORTOGRAM W/LOWER EXTREMITY Bilateral 08/08/2020   Procedure: ABDOMINAL AORTOGRAM W/LOWER EXTREMITY;  Surgeon: Chuck Hint, MD;  Location: [facility] INVASIVE CV LAB;  Service: Cardiovascular;  Laterality: Bilateral;   AMPUTATION Right 12/12/2020   Procedure: RIGHT BELOW KNEE AMPUTATION;  Surgeon: Nadara Mustard, MD;  Location: [facility] OR;  Service: Orthopedics;  Laterality: Right;   BACK SURGERY     "total of 3 times" S/P fall    CARPAL TUNNEL RELEASE Bilateral    CHOLECYSTECTOMY  1990's   COLONOSCOPY     COLONOSCOPY Left 07/08/2023   Procedure: COLONOSCOPY;  Surgeon: Shellia Cleverly, DO;  Location: [facility] ENDOSCOPY;  Service: Gastroenterology;  Laterality: Left;   CORONARY ANGIOPLASTY  11/11/11   CORONARY ANGIOPLASTY WITH STENT PLACEMENT  09/30/2011   "1 then; makes a total of 4"   CORONARY ANGIOPLASTY WITH STENT PLACEMENT  11/11/11   "1; makes a total of 5"   ESOPHAGOGASTRODUODENOSCOPY Left 07/08/2023   Procedure: ESOPHAGOGASTRODUODENOSCOPY (EGD);  Surgeon: Shellia Cleverly, DO;  Location: [facility] ENDOSCOPY;  Service: Gastroenterology;  Laterality: Left;   HEMOSTASIS CLIP PLACEMENT  07/08/2023   Procedure: HEMOSTASIS CLIP PLACEMENT;  Surgeon: Shellia Cleverly, DO;  Location: [facility] ENDOSCOPY;  Service: Gastroenterology;;   HOT HEMOSTASIS N/A 07/08/2023   Procedure: HOT HEMOSTASIS (ARGON PLASMA COAGULATION/BICAP);  Surgeon: Shellia Cleverly, DO;  Location: [facility] ENDOSCOPY;   Service: Gastroenterology;  Laterality: N/A;   INGUINAL HERNIA REPAIR  2003   right   JOINT REPLACEMENT Right 04/03/2002  hip replacment   KNEE ARTHROSCOPY  1990's   left   LEFT HEART CATH AND CORONARY ANGIOGRAPHY N/A 04/25/2023   Procedure: LEFT HEART CATH AND CORONARY ANGIOGRAPHY;  Surgeon: Marykay Lex, MD;  Location: [facility] INVASIVE CV LAB;  Service: Cardiovascular;  Laterality: N/A;   LEFT HEART CATHETERIZATION WITH CORONARY ANGIOGRAM N/A 09/30/2011   Procedure: LEFT HEART CATHETERIZATION WITH CORONARY ANGIOGRAM;  Surgeon: Corky Crafts, MD;  Location: [facility] CATH LAB;  Service: Cardiovascular;  Laterality: N/A;  possible PCI   LEFT HEART CATHETERIZATION WITH CORONARY ANGIOGRAM N/A 11/15/2011   Procedure: LEFT HEART CATHETERIZATION WITH CORONARY ANGIOGRAM;  Surgeon: Corky Crafts, MD;  Location: [facility] CATH LAB;  Service: Cardiovascular;  Laterality: N/A;   PERCUTANEOUS CORONARY STENT INTERVENTION (PCI-S)  09/30/2011   Procedure: PERCUTANEOUS CORONARY STENT INTERVENTION (PCI-S);  Surgeon: Corky Crafts, MD;  Location: [facility] CATH LAB;  Service: Cardiovascular;;   PERCUTANEOUS CORONARY STENT INTERVENTION (PCI-S) N/A 11/11/2011   Procedure: PERCUTANEOUS CORONARY STENT INTERVENTION (PCI-S);  Surgeon: Corky Crafts, MD;  Location: [facility] CATH LAB;  Service: Cardiovascular;  Laterality: N/A;   PERIPHERAL VASCULAR BALLOON ANGIOPLASTY Right 08/08/2020   Procedure: PERIPHERAL VASCULAR BALLOON ANGIOPLASTY;  Surgeon: Chuck Hint, MD;  Location: [facility] INVASIVE CV LAB;  Service: Cardiovascular;  Laterality: Right;  Posterior tibial    SHOULDER SURGERY Right    X 2   STUMP REVISION Right 01/09/2021   Procedure: REVISION RIGHT BELOW KNEE AMPUTATION;  Surgeon: Nadara Mustard, MD;  Location: [facility] OR;  Service: Orthopedics;  Laterality: Right;   TONSILLECTOMY  ~ 1948   TOTAL HIP REVISION Right 04/13/2019   Procedure: RIGHT TOTAL HIP REVISION-POSTERIOR  APPROACH LATERAL;  Surgeon: Eldred Manges,  MD;  Location: [facility] OR;  Service: Orthopedics;  Laterality: Right;   TOTAL KNEE ARTHROPLASTY Left 07/23/2016   Procedure: LEFT TOTAL KNEE ARTHROPLASTY;  Surgeon: Eldred Manges, MD;  Location: [facility] OR;  Service: Orthopedics;  Laterality: Left;   Social History   Occupational History   Occupation: Retired  Tobacco Use   Smoking status: Former   Smokeless tobacco: Current    Types: Chew   Tobacco comments:    quit 60 years ago  Vaping Use   Vaping status: Never Used  Substance and Sexual Activity   Alcohol use: No   Drug use: No   Sexual activity: Not Currently

## 2023-09-06 DIAGNOSIS — H353211 Exudative age-related macular degeneration, right eye, with active choroidal neovascularization: Secondary | ICD-10-CM | POA: Diagnosis not present

## 2023-09-06 DIAGNOSIS — H353221 Exudative age-related macular degeneration, left eye, with active choroidal neovascularization: Secondary | ICD-10-CM | POA: Diagnosis not present

## 2023-09-06 DIAGNOSIS — H353133 Nonexudative age-related macular degeneration, bilateral, advanced atrophic without subfoveal involvement: Secondary | ICD-10-CM | POA: Diagnosis not present

## 2023-09-06 DIAGNOSIS — E113393 Type 2 diabetes mellitus with moderate nonproliferative diabetic retinopathy without macular edema, bilateral: Secondary | ICD-10-CM | POA: Diagnosis not present

## 2023-09-07 ENCOUNTER — Telehealth: Payer: Self-pay | Admitting: Physician Assistant

## 2023-09-07 ENCOUNTER — Other Ambulatory Visit: Payer: Self-pay | Admitting: Urology

## 2023-09-07 DIAGNOSIS — H353221 Exudative age-related macular degeneration, left eye, with active choroidal neovascularization: Secondary | ICD-10-CM | POA: Diagnosis not present

## 2023-09-07 DIAGNOSIS — E113393 Type 2 diabetes mellitus with moderate nonproliferative diabetic retinopathy without macular edema, bilateral: Secondary | ICD-10-CM | POA: Diagnosis not present

## 2023-09-07 DIAGNOSIS — H353133 Nonexudative age-related macular degeneration, bilateral, advanced atrophic without subfoveal involvement: Secondary | ICD-10-CM | POA: Diagnosis not present

## 2023-09-07 DIAGNOSIS — H353211 Exudative age-related macular degeneration, right eye, with active choroidal neovascularization: Secondary | ICD-10-CM | POA: Diagnosis not present

## 2023-09-07 NOTE — Telephone Encounter (Signed)
   Pre-operative Risk Assessment    Patient Name: Keith Espinoza  DOB: 08-09-1941 MRN: [id number]      Request for Surgical Clearance    Procedure:   Circumcisiom  Date of Surgery:  Clearance 10/12/23                                 Surgeon:  Dr Leta Jungling Group or Practice Name:  [facility] Phone number:  [phone]  Fax number:  [phone]   Type of Clearance Requested:   - Medical  - Pharmacy:  Hold Clopidogrel (Plavix) 5 days   Type of Anesthesia:  General    Additional requests/questions:    Nilda Riggs   09/07/2023, 11:54 AM

## 2023-09-07 NOTE — Telephone Encounter (Signed)
     Primary Cardiologist: Lance Muss, MD  Chart reviewed as part of pre-operative protocol coverage. Given past medical history and time since last visit, based on ACC/AHA guidelines, Keith Espinoza would be at acceptable risk for the planned procedure without further cardiovascular testing.   Antiplatelet and/or Anticoagulation Recommendations: Clopidogrel (Plavix) can be held for 5 days prior to his surgery and resumed as soon as possible post op.  Would start aspirin 81 mg while off Plavix if bleeding risk is not prohibitive.  I will route this recommendation to the requesting party via Epic fax function and remove from pre-op pool.  Please call with questions.  Thomasene Ripple. Jonai Weyland NP-C     09/07/2023, 12:12 PM [facility] [address] Office [phone] Fax [phone]

## 2023-09-22 ENCOUNTER — Ambulatory Visit: Payer: Medicare Other | Admitting: Orthopedic Surgery

## 2023-09-22 DIAGNOSIS — Z89511 Acquired absence of right leg below knee: Secondary | ICD-10-CM | POA: Diagnosis not present

## 2023-09-25 ENCOUNTER — Encounter: Payer: Self-pay | Admitting: Orthopedic Surgery

## 2023-09-25 NOTE — Progress Notes (Signed)
Office Visit Note   Patient: Keith Espinoza           Date of Birth: 03-Oct-1941           MRN: [id number] Visit Date: 09/22/2023              Requested by: Emilio Aspen, MD [address] [place],  [place] [address] PCP: Emilio Aspen, MD  Chief Complaint  Patient presents with   Right Leg - Follow-up    HX right BKA       HPI: Patient is an 82 year old gentleman who is 2 years status post right transtibial amputation.  Patient complains of pressure ulcers.  Assessment & Plan: Visit Diagnoses:  1. Right below-knee amputee [facility])     Plan: Prescription was provided for a new prosthesis socket liner materials and supplies.  Follow-Up Instructions: Return in about 4 weeks (around 10/20/2023).   Ortho Exam  Patient is alert, oriented, no adenopathy, well-dressed, normal affect, normal respiratory effort. Examination patient has an bearing pressure ulcer over the tibial tubercle and fibular head.  There is no cellulitis no signs of infection.  Patient is an existing right transtibial  amputee.  Patient's current comorbidities are not expected to impact the ability to function with the prescribed prosthesis. Patient verbally communicates a strong desire to use a prosthesis. Patient currently requires mobility aids to ambulate without a prosthesis.  Expects not to use mobility aids with a new prosthesis.  Patient is a K2 level ambulator that will use a prosthesis to walk around their home and the community over low level environmental barriers.      Imaging: No results found. No images are attached to the encounter.  Labs: Lab Results  Component Value Date   HGBA1C 7.1 (A) 04/01/2023   HGBA1C 7.2 (A) 11/11/2022   HGBA1C 9.0 (A) 06/23/2022   REPTSTATUS 07/08/2023 FINAL 07/03/2023   GRAMSTAIN  04/13/2019    RARE WBC PRESENT, PREDOMINANTLY PMN NO ORGANISMS SEEN    CULT  07/03/2023    NO GROWTH 5 DAYS Performed at [facility], [address], [place], [place] [address]      Lab Results  Component Value Date   ALBUMIN 2.6 (L) 07/04/2023   ALBUMIN 2.3 (L) 07/03/2023   ALBUMIN 3.4 (L) 04/24/2023    Lab Results  Component Value Date   MG 2.0 07/08/2023   MG 1.9 07/07/2023   MG 1.8 07/06/2023   No results found for: "VD25OH"  No results found for: "PREALBUMIN"    Latest Ref Rng & Units 07/10/2023    2:27 AM 07/09/2023    2:25 AM 07/08/2023    2:21 AM  CBC EXTENDED  WBC 4.0 - 10.5 K/uL 6.4  8.6  7.1   RBC 4.22 - 5.81 MIL/uL 3.32  3.50  3.76   Hemoglobin 13.0 - 17.0 g/dL 9.6  9.7  02.7   HCT 25.3 - 52.0 % 30.5  31.4  33.2   Platelets 150 - 400 K/uL 110  141  129   NEUT# 1.7 - 7.7 K/uL  6.2    Lymph# 0.7 - 4.0 K/uL  1.3       There is no height or weight on file to calculate BMI.  Orders:  No orders of the defined types were placed in this encounter.  No orders of the defined types were placed in this encounter.    Procedures: No procedures performed  Clinical Data: No additional findings.  ROS:  All other systems negative, except as noted in the HPI. Review of Systems  Objective: Vital Signs: There were no vitals taken for this visit.  Specialty Comments:  No specialty comments available.  PMFS History: Patient Active Problem List   Diagnosis Date Noted   Acute on chronic combined systolic and diastolic HF (heart failure) (HCC) 07/09/2023   Gastric AVM 07/08/2023   Diverticulosis of colon without hemorrhage 07/08/2023   Gastrointestinal hemorrhage associated with acute gastritis 07/08/2023   PSVT (paroxysmal supraventricular tachycardia) (HCC) 07/08/2023   Paroxysmal A-fib (HCC) 07/08/2023   Grade I diastolic dysfunction 07/08/2023   Acute on chronic anemia 07/07/2023   New onset atrial fibrillation (HCC) 07/07/2023   Ischemic cardiomyopathy 07/07/2023   Acute systolic CHF (congestive heart failure) (HCC) 07/07/2023   Pressure injury of skin 07/07/2023   Blister  07/06/2023   Edema of both lower extremities 07/05/2023   CHF exacerbation (HCC) 07/05/2023   Iron deficiency anemia 07/05/2023   Shortness of breath 07/03/2023   Finger numbness 05/05/2023   Elevated troponin 04/25/2023   Chest pain of uncertain etiology 04/23/2023   Unstable angina (HCC) 04/23/2023   AKI (acute kidney injury) (HCC) 04/23/2023   History of CAD (coronary artery disease) 04/23/2023   Contusion of right hip 03/08/2023   Type 2 diabetes mellitus with hyperglycemia, with long-term current use of insulin (HCC) 02/15/2022   Adenoma of left adrenal gland 02/15/2022   History of right below knee amputation (HCC) 10/06/2021   Exudative age-related macular degeneration of left eye with active choroidal neovascularization (HCC) 07/28/2021   Exudative age-related macular degeneration of right eye with active choroidal neovascularization (HCC) 06/01/2021   Intermediate stage nonexudative age-related macular degeneration of both eyes 06/01/2021   Type 2 diabetes mellitus with diabetic polyneuropathy, with long-term current use of insulin (HCC) 03/24/2021   Constipation 02/02/2021   Fecal impaction (HCC) 02/01/2021   Wound dehiscence 01/09/2021   Dehiscence of amputation stump (HCC)    Status post percutaneous transluminal coronary angioplasty 01/06/2021   Type II diabetes mellitus, uncontrolled 01/06/2021   Acute pancreatitis 01/06/2021   Diabetic peripheral vascular disease (HCC) 01/06/2021   Encounter for screening for other disorder 01/06/2021   Enlarged prostate 01/06/2021   Foot ulcer, right (HCC) 01/06/2021   Gout 01/06/2021   Headache 01/06/2021   Neck pain 01/06/2021   Hypoglycemia 01/06/2021   Loss of appetite 01/06/2021   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   Peripheral neuropathy 01/06/2021   Sciatica 01/06/2021   Vitamin B12 deficiency 01/06/2021   Vitamin D deficiency 01/06/2021   Weakness 01/06/2021   Diabetes mellitus type 2 with neurological  manifestations (HCC) 01/06/2021   Protein-calorie malnutrition, severe 12/26/2020   Acute blood loss anemia 12/26/2020   Prerenal azotemia 12/26/2020   Below-knee amputation of right lower extremity (HCC) 12/12/2020   Gangrene of right foot (HCC)    Diabetic neuropathy (HCC) 11/17/2020   Hyperglycemia due to type 2 diabetes mellitus (HCC) 11/17/2020   Long term (current) use of insulin (HCC) 11/17/2020   Obesity 11/17/2020   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/2019   Hip dislocation, right (HCC) 04/13/2019   Other intervertebral disc degeneration, lumbar region 03/30/2019   Coronary artery disease 01/30/2019   Tobacco abuse 01/30/2019   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   Burn, foot, second degree, left, initial encounter 06/08/2017   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 03/16/2017   S/P total knee arthroplasty, left 10/26/2016   Hyperlipidemia 09/04/2014   Thrombocytopenia (HCC)  Precordial chest pain 04/05/2014   Coronary atherosclerosis of native coronary artery 10/01/2013   Other and unspecified hyperlipidemia 10/01/2013   Hypertension 10/01/2013   Type 2 diabetes mellitus with complication, without long-term current use of insulin (HCC) 10/01/2013   Esophageal reflux 10/01/2013   Hypertrophy of prostate without urinary obstruction and other lower urinary tract symptoms (LUTS) 10/01/2013   Past Medical History:  Diagnosis Date   Allergic rhinitis    Allergic rhinitis    Arthritis    Basal cell carcinoma 11/01/2019    bcc left chest treatment TX [id number] [id number]    Chronic leg pain    right   Chronic lower back pain    Coronary artery disease    a. Stenting to RCA 2004; staged DES to LAD and Cx 2004. DES to [facility] 2012. b. DES to mCx, PTCA to dCx 11/2011. c. Lateral wall MI 2013 s/p PTCA to distal Cx & DES to mid OM2 11/2011. d. Low risk nuc 04/2014, EF wnl.   COVID-19    Diabetes mellitus    Insulin dependent   Diabetic neuropathy (HCC)    MILD    Diverticulosis    Dysrhythmia    [name] syndrome    Gout    right wrist; right foot; right elbow; have had it since 1970's   H/O hiatal hernia    Heart murmur    History of echocardiogram    aortic sclerosis per echo 12/09 EF 65%, otherwise normal   History of hemorrhoids    BLEEDING   History of kidney stones    h/o   Hypertension    Diagnosed 1995    Myocardial infarction [facility])    Pancreatic pseudocyst    a. s/p remote drainage 2006.   Thrombocytopenia (HCC)    Seen on oldest labs in system from 2004   Vitamin B 12 deficiency    orally replaced    Family History  Problem Relation Age of Onset   Diabetes Mother    Hyperlipidemia Mother    Hypertension Mother    Cancer Father    Hypertension Father    Diabetes Sister    Hypertension Sister    Cancer Brother    Heart attack Neg Hx     Past Surgical History:  Procedure Laterality Date   ABDOMINAL AORTOGRAM W/LOWER EXTREMITY Bilateral 08/08/2020   Procedure: ABDOMINAL AORTOGRAM W/LOWER EXTREMITY;  Surgeon: Chuck Hint, MD;  Location: [facility] INVASIVE CV LAB;  Service: Cardiovascular;  Laterality: Bilateral;   AMPUTATION Right 12/12/2020   Procedure: RIGHT BELOW KNEE AMPUTATION;  Surgeon: Nadara Mustard, MD;  Location: [facility] OR;  Service: Orthopedics;  Laterality: Right;   BACK SURGERY     "total of 3 times" S/P fall    CARPAL TUNNEL RELEASE Bilateral    CHOLECYSTECTOMY  1990's   COLONOSCOPY     COLONOSCOPY Left 07/08/2023   Procedure: COLONOSCOPY;  Surgeon: Shellia Cleverly, DO;  Location: [facility] ENDOSCOPY;  Service: Gastroenterology;  Laterality: Left;   CORONARY ANGIOPLASTY  11/11/11   CORONARY ANGIOPLASTY WITH STENT PLACEMENT  09/30/2011   "1 then; makes a total of 4"   CORONARY ANGIOPLASTY WITH STENT PLACEMENT  11/11/11   "1; makes a total of 5"   ESOPHAGOGASTRODUODENOSCOPY Left 07/08/2023   Procedure: ESOPHAGOGASTRODUODENOSCOPY (EGD);  Surgeon: Shellia Cleverly, DO;  Location: [facility] ENDOSCOPY;  Service:  Gastroenterology;  Laterality: Left;   HEMOSTASIS CLIP PLACEMENT  07/08/2023   Procedure: HEMOSTASIS CLIP PLACEMENT;  Surgeon: Shellia Cleverly, DO;  Location: [facility]  ENDOSCOPY;  Service: Gastroenterology;;   HOT HEMOSTASIS N/A 07/08/2023   Procedure: HOT HEMOSTASIS (ARGON PLASMA COAGULATION/BICAP);  Surgeon: Shellia Cleverly, DO;  Location: [facility] ENDOSCOPY;  Service: Gastroenterology;  Laterality: N/A;   INGUINAL HERNIA REPAIR  2003   right   JOINT REPLACEMENT Right 04/03/2002   hip replacment   KNEE ARTHROSCOPY  1990's   left   LEFT HEART CATH AND CORONARY ANGIOGRAPHY N/A 04/25/2023   Procedure: LEFT HEART CATH AND CORONARY ANGIOGRAPHY;  Surgeon: Marykay Lex, MD;  Location: [facility] INVASIVE CV LAB;  Service: Cardiovascular;  Laterality: N/A;   LEFT HEART CATHETERIZATION WITH CORONARY ANGIOGRAM N/A 09/30/2011   Procedure: LEFT HEART CATHETERIZATION WITH CORONARY ANGIOGRAM;  Surgeon: Corky Crafts, MD;  Location: [facility] CATH LAB;  Service: Cardiovascular;  Laterality: N/A;  possible PCI   LEFT HEART CATHETERIZATION WITH CORONARY ANGIOGRAM N/A 11/15/2011   Procedure: LEFT HEART CATHETERIZATION WITH CORONARY ANGIOGRAM;  Surgeon: Corky Crafts, MD;  Location: [facility] CATH LAB;  Service: Cardiovascular;  Laterality: N/A;   PERCUTANEOUS CORONARY STENT INTERVENTION (PCI-S)  09/30/2011   Procedure: PERCUTANEOUS CORONARY STENT INTERVENTION (PCI-S);  Surgeon: Corky Crafts, MD;  Location: [facility] CATH LAB;  Service: Cardiovascular;;   PERCUTANEOUS CORONARY STENT INTERVENTION (PCI-S) N/A 11/11/2011   Procedure: PERCUTANEOUS CORONARY STENT INTERVENTION (PCI-S);  Surgeon: Corky Crafts, MD;  Location: [facility] CATH LAB;  Service: Cardiovascular;  Laterality: N/A;   PERIPHERAL VASCULAR BALLOON ANGIOPLASTY Right 08/08/2020   Procedure: PERIPHERAL VASCULAR BALLOON ANGIOPLASTY;  Surgeon: Chuck Hint, MD;  Location: [facility] INVASIVE CV LAB;  Service: Cardiovascular;  Laterality: Right;  Posterior tibial     SHOULDER SURGERY Right    X 2   STUMP REVISION Right 01/09/2021   Procedure: REVISION RIGHT BELOW KNEE AMPUTATION;  Surgeon: Nadara Mustard, MD;  Location: [facility] OR;  Service: Orthopedics;  Laterality: Right;   TONSILLECTOMY  ~ 1948   TOTAL HIP REVISION Right 04/13/2019   Procedure: RIGHT TOTAL HIP REVISION-POSTERIOR  APPROACH LATERAL;  Surgeon: Eldred Manges, MD;  Location: [facility] OR;  Service: Orthopedics;  Laterality: Right;   TOTAL KNEE ARTHROPLASTY Left 07/23/2016   Procedure: LEFT TOTAL KNEE ARTHROPLASTY;  Surgeon: Eldred Manges, MD;  Location: [facility] OR;  Service: Orthopedics;  Laterality: Left;   Social History   Occupational History   Occupation: Retired  Tobacco Use   Smoking status: Former   Smokeless tobacco: Current    Types: Chew   Tobacco comments:    quit 60 years ago  Vaping Use   Vaping status: Never Used  Substance and Sexual Activity   Alcohol use: No   Drug use: No   Sexual activity: Not Currently

## 2023-09-29 ENCOUNTER — Encounter (HOSPITAL_COMMUNITY)
Admission: RE | Admit: 2023-09-29 | Discharge: 2023-09-29 | Disposition: A | Discharge status: Home / Self Care | Payer: Medicare Other | Source: Ambulatory Visit | Attending: Urology | Admitting: Urology

## 2023-09-29 ENCOUNTER — Encounter (HOSPITAL_COMMUNITY): Payer: Self-pay

## 2023-09-29 ENCOUNTER — Other Ambulatory Visit: Payer: Self-pay

## 2023-09-29 VITALS — BP 107/55 | HR 62 | Temp 98.2°F | Resp 16 | Ht 70.0 in | Wt 165.0 lb

## 2023-09-29 DIAGNOSIS — Z01818 Encounter for other preprocedural examination: Secondary | ICD-10-CM | POA: Diagnosis present

## 2023-09-29 DIAGNOSIS — Z794 Long term (current) use of insulin: Secondary | ICD-10-CM | POA: Diagnosis not present

## 2023-09-29 DIAGNOSIS — D649 Anemia, unspecified: Secondary | ICD-10-CM | POA: Insufficient documentation

## 2023-09-29 DIAGNOSIS — E119 Type 2 diabetes mellitus without complications: Secondary | ICD-10-CM | POA: Diagnosis not present

## 2023-09-29 DIAGNOSIS — Z01812 Encounter for preprocedural laboratory examination: Secondary | ICD-10-CM | POA: Diagnosis not present

## 2023-09-29 HISTORY — DX: Nonrheumatic aortic (valve) stenosis: I35.0

## 2023-09-29 HISTORY — DX: Anemia, unspecified: D64.9

## 2023-09-29 HISTORY — DX: Heart failure, unspecified: I50.9

## 2023-09-29 LAB — CBC
HCT: 32.2 % — ABNORMAL LOW (ref 39.0–52.0)
Hemoglobin: 10.1 g/dL — ABNORMAL LOW (ref 13.0–17.0)
MCH: 30.7 pg (ref 26.0–34.0)
MCHC: 31.4 g/dL (ref 30.0–36.0)
MCV: 97.9 fL (ref 80.0–100.0)
Platelets: 104 10*3/uL — ABNORMAL LOW (ref 150–400)
RBC: 3.29 MIL/uL — ABNORMAL LOW (ref 4.22–5.81)
RDW: 19.9 % — ABNORMAL HIGH (ref 11.5–15.5)
WBC: 4.5 10*3/uL (ref 4.0–10.5)
nRBC: 0 % (ref 0.0–0.2)

## 2023-09-29 LAB — BASIC METABOLIC PANEL
Anion gap: 11 (ref 5–15)
BUN: 24 mg/dL — ABNORMAL HIGH (ref 8–23)
CO2: 26 mmol/L (ref 22–32)
Calcium: 9.7 mg/dL (ref 8.9–10.3)
Chloride: 100 mmol/L (ref 98–111)
Creatinine, Ser: 1.13 mg/dL (ref 0.61–1.24)
GFR, Estimated: >60 mL/min (ref >60)
Glucose, Bld: 258 mg/dL — ABNORMAL HIGH (ref 70–99)
Potassium: 3.5 mmol/L (ref 3.5–5.1)
Sodium: 137 mmol/L (ref 135–145)

## 2023-09-29 LAB — GLUCOSE, CAPILLARY: Glucose-Capillary: 239 mg/dL — ABNORMAL HIGH (ref 70–99)

## 2023-09-29 NOTE — Progress Notes (Addendum)
COVID Vaccine Completed:  Yes  Date of COVID positive in last 90 days:  No  PCP - Eleanora Neighbor, MD Cardiologist - Donato Schultz, MD  Cardiac clearance in Epic dated 09-07-23 by Edd Fabian, NP-C  Chest x-ray - 07-03-23 Epic EKG - 07-07-23 Epic Stress Test - 04-06-14 Epic ECHO - 07-04-23 Epic Cardiac Cath - 04-25-23 Epic Pacemaker/ICD device last checked: Spinal Cord Stimulator: N/A  Bowel Prep - N/A  Sleep Study - N/A CPAP -   Dexcom on L arm Fasting Blood Sugar - 150 to 200 Checks Blood Sugar -  4 to 5  times a day  Last dose of GLP1 agonist-  N/A GLP1 instructions:  Hold 7 days before surgery    Jardiance  Last dose of SGLT-2 inhibitors-  10-08-23 SGLT-2 instructions:  Hold 3 days before surgery    Blood Thinner Instructions:  Plavix hold x5 day.  Patient aware Aspirin Instructions:  ASA 81.  Per patient to hold x 5 days Last Dose:  Activity level:   Patient does not have to climb stairs but able to  perform activities of daily living without stopping and without symptoms of chest pain or shortness of breath.  Patient lives alone and is an amputee,  uses a wheelchair when needed.   Anesthesia review:  CAD, Afib, CHF, HTN, DM  A1c 9 on PAT labs  Patient denies shortness of breath, fever, cough and chest pain at PAT appointment  Patient verbalized understanding of instructions that were given to them at the PAT appointment. Patient was also instructed that they will need to review over the PAT instructions again at home before surgery.

## 2023-09-29 NOTE — Patient Instructions (Addendum)
SURGICAL WAITING ROOM VISITATION Patients having surgery or a procedure may have no more than 2 support people in the waiting area - these visitors may rotate.    Children under the age of 48 must have an adult with them who is not the patient.  If the patient needs to stay at the hospital during part of their recovery, the visitor guidelines for inpatient rooms apply. Pre-op nurse will coordinate an appropriate time for 1 support person to accompany patient in pre-op.  This support person may not rotate.    Please refer to the [facility] website for the visitor guidelines for Inpatients (after your surgery is over and you are in a regular room).       Your procedure is scheduled on: 10-11-22   Report to [facility] Main Entrance    Report to admitting at 8:00 AM   Call this number if you have problems the morning of surgery [phone]   Do not eat food or drink liquids :After Midnight.           If you have questions, please contact your surgeon's office.   FOLLOW  ANY ADDITIONAL PRE OP INSTRUCTIONS YOU RECEIVED FROM YOUR SURGEON'S OFFICE!!!     Oral Hygiene is also important to reduce your risk of infection.                                    Remember - BRUSH YOUR TEETH THE MORNING OF SURGERY WITH YOUR REGULAR TOOTHPASTE   Do NOT smoke after Midnight   Take these medicines the morning of surgery with A SIP OF WATER:   Allopurinol  Gabapentin  Isosorbide  Metoprolol  Rosuvastatin  Terazosin  Stop all vitamins and herbal supplements 7 days before surgery  How to Manage Your Diabetes Before and After Surgery  Why is it important to control my blood sugar before and after surgery? Improving blood sugar levels before and after surgery helps healing and can limit problems. A way of improving blood sugar control is eating a healthy diet by:  Eating less sugar and carbohydrates  Increasing activity/exercise  Talking with your doctor about reaching your blood  sugar goals High blood sugars (greater than 180 mg/dL) can raise your risk of infections and slow your recovery, so you will need to focus on controlling your diabetes during the weeks before surgery. Make sure that the doctor who takes care of your diabetes knows about your planned surgery including the date and location.  How do I manage my blood sugar before surgery? Check your blood sugar at least 4 times a day, starting 2 days before surgery, to make sure that the level is not too high or low. Check your blood sugar the morning of your surgery when you wake up and every 2 hours until you get to the Short Stay unit. If your blood sugar is less than 70 mg/dL, you will need to treat for low blood sugar: Do not take insulin. Treat a low blood sugar (less than 70 mg/dL) with  cup of clear juice (cranberry or apple), 4 glucose tablets, OR glucose gel. Recheck blood sugar in 15 minutes after treatment (to make sure it is greater than 70 mg/dL). If your blood sugar is not greater than 70 mg/dL on recheck, call [phone] for further instructions. Report your blood sugar to the short stay nurse when you get to Short Stay.  If  you are admitted to the hospital after surgery: Your blood sugar will be checked by the staff and you will probably be given insulin after surgery (instead of oral diabetes medicines) to make sure you have good blood sugar levels. The goal for blood sugar control after surgery is 80-180 mg/dL.   WHAT DO I DO ABOUT MY DIABETES MEDICATION?  Do not take oral diabetes medicines (pills) the morning of surgery.        Hold Jardiance 3 days before surgery (do not take after 10-08-23)   The morning of surgery do 6 units of Tresiba.      THE MORNING OF SURGERY, take 1/2 of sliding scale insulin if CBG 220 or higher.  DO NOT TAKE THE FOLLOWING 7 DAYS PRIOR TO SURGERY: Ozempic, Wegovy, Rybelsus (Semaglutide), Byetta (exenatide), Bydureon (exenatide ER), Victoza, Saxenda  (liraglutide), or Trulicity (dulaglutide) Mounjaro (Tirzepatide) Adlyxin (Lixisenatide), Polyethylene Glycol Loxenatide.   Reviewed and Endorsed by [facility] Patient Education Committee, August 2015                             You may not have any metal on your body including , jewelry, and body piercing             Do not wear  lotions, powders, cologne, or deodorant              Men may shave face and neck.   Do not bring valuables to the hospital. [facility] IS NOT RESPONSIBLE   FOR VALUABLES.   Contacts, dentures or bridgework may not be worn into surgery.  DO NOT BRING YOUR HOME MEDICATIONS TO THE HOSPITAL. PHARMACY WILL DISPENSE MEDICATIONS LISTED ON YOUR MEDICATION LIST TO YOU DURING YOUR ADMISSION IN THE HOSPITAL!    Patients discharged on the day of surgery will not be allowed to drive home.  Someone NEEDS to stay with you for the first 24 hours after anesthesia.   Special Instructions: Bring a copy of your healthcare power of attorney and living will documents the day of surgery if you haven't scanned them before.              Please read over the following fact sheets you were given: IF YOU HAVE QUESTIONS ABOUT YOUR PRE-OP INSTRUCTIONS PLEASE CALL [phone] [facility]  If you received a COVID test during your pre-op visit  it is requested that you wear a mask when out in public, stay away from anyone that may not be feeling well and notify your surgeon if you develop symptoms. If you test positive for Covid or have been in contact with anyone that has tested positive in the last 10 days please notify you surgeon.  [facility] - Preparing for Surgery Before surgery, you can play an important role.  Because skin is not sterile, your skin needs to be as free of germs as possible.  You can reduce the number of germs on your skin by washing with CHG (chlorahexidine gluconate) soap before surgery.  CHG is an antiseptic cleaner which kills germs and bonds with the skin to continue  killing germs even after washing. Please DO NOT use if you have an allergy to CHG or antibacterial soaps.  If your skin becomes reddened/irritated stop using the CHG and inform your nurse when you arrive at Short Stay. Do not shave (including legs and underarms) for at least 48 hours prior to the first CHG shower.  You may shave  your face/neck.  Please follow these instructions carefully:  1.  Shower with CHG Soap the night before surgery and the  morning of surgery.  2.  If you choose to wash your hair, wash your hair first as usual with your normal  shampoo.  3.  After you shampoo, rinse your hair and body thoroughly to remove the shampoo.                             4.  Use CHG as you would any other liquid soap.  You can apply chg directly to the skin and wash.  Gently with a scrungie or clean washcloth.  5.  Apply the CHG Soap to your body ONLY FROM THE NECK DOWN.   Do   not use on face/ open                           Wound or open sores. Avoid contact with eyes, ears mouth and   genitals (private parts).                       Wash face,  Genitals (private parts) with your normal soap.             6.  Wash thoroughly, paying special attention to the area where your    surgery  will be performed.  7.  Thoroughly rinse your body with warm water from the neck down.  8.  DO NOT shower/wash with your normal soap after using and rinsing off the CHG Soap.                9.  Pat yourself dry with a clean towel.            10.  Wear clean pajamas.            11.  Place clean sheets on your bed the night of your first shower and do not  sleep with pets. Day of Surgery : Do not apply any lotions/deodorants the morning of surgery.  Please wear clean clothes to the hospital/surgery center.  FAILURE TO FOLLOW THESE INSTRUCTIONS MAY RESULT IN THE CANCELLATION OF YOUR SURGERY  PATIENT SIGNATURE_________________________________  NURSE  SIGNATURE__________________________________  ________________________________________________________________________

## 2023-09-30 ENCOUNTER — Encounter (HOSPITAL_COMMUNITY): Payer: Self-pay

## 2023-09-30 LAB — HEMOGLOBIN A1C
Hgb A1c MFr Bld: 9 % — ABNORMAL HIGH (ref 4.8–5.6)
Mean Plasma Glucose: 212 mg/dL

## 2023-09-30 NOTE — Progress Notes (Signed)
Case: [id number] Date/Time: 10/12/23 1000   Procedure: CIRCUMCISION ADULT   Anesthesia type: General   Pre-op diagnosis: PHIMOSIS   Location: [facility]   Surgeons: Despina Arias, MD       DISCUSSION: Keith Espinoza is an 82 yo male who presents to PAT prior to surgery above. PMH of HTN, CAD s/p PCI (2004, 2012, 2013), hx of MI, CHF, PVD s/p R BKA in 2022, mild-mod AS, GERD, hiatal hernia, Gilbert syndrome, IDDM c/b neuropathy, anemia, thrombocytopenia, chronic pain with narcotic dependence, s/p L TKA c/b periprosthetic fx  Patient follows with Cardiology for severe multi-vessel CAD s/p multiple PCIs, CHF, AS. He was admitted for unstable angina in July 2024 and underwent cath at that time which showed severe multivessel disease with moderate in-stent restenosis of the LAD, RCA, and left circumflex arteries. CT surgery was consulted while inpatient and CABG was not pursued per Dr. Laneta Espinoza: "I think coronary bypass graft surgery would not be advisable in this patient for multiple reasons including his advanced age combined with severe aortoiliac and peripheral vascular disease status post right BKA amputation, diabetes, limited mobility even with his prosthesis and inability to ambulate postoperatively for several weeks until his prosthesis can be refitted, inability to graft more than his LAD with certainty.  His risk of perioperative complications is significant and I think it is unlikely that he will recover to go home and be independent again."  Medical therapy has been maximized. He was admitted for a CHF exacerbation from 9/29-10/6. Echo showed EF had decreased to 40-45%. He was diuresed and discharged. He has followed up in Cardiology clinic on 08/12/23. He is on GDMT, statin, Plavix. He was cleared for surgery as high risk:  Preoperative Clearance   "Keith Espinoza's perioperative risk of a major cardiac event is 6.6% according to the Revised Cardiac Risk Index (RCRI).  Therefore, he  is at high risk for perioperative complications. His functional capacity is poor at 3.41 METs according to the Duke Activity Status Index (DASI). Recommendations: The patient is at high risk for perioperative cardiac complications and is at a low functional capacity.  However, further testing will not change how his cardiac status is managed.  Proceed with surgery at high risk if there are no other options for treatment. Antiplatelet and/or Anticoagulation Recommendations: Clopidogrel (Plavix) can be held for 5-7 days prior to his surgery and resumed as soon as possible post op.  Would start aspirin 81 mg while off Plavix if bleeding risk is not prohibitive"    VS: BP (!) 107/55   Pulse 62   Temp 36.8 C (Oral)   Resp 16   Ht 5\' 10"  (1.778 m)   Wt 74.8 kg   SpO2 99%   BMI 23.68 kg/m   PROVIDERS: Emilio Aspen, MD   LABS: Labs reviewed: Acceptable for surgery. (all labs ordered are listed, but only abnormal results are displayed)  Labs Reviewed  HEMOGLOBIN A1C - Abnormal; Notable for the following components:      Result Value   Hgb A1c MFr Bld 9.0 (*)    All other components within normal limits  BASIC METABOLIC PANEL - Abnormal; Notable for the following components:   Glucose, Bld 258 (*)    BUN 24 (*)    All other components within normal limits  CBC - Abnormal; Notable for the following components:   RBC 3.29 (*)    Hemoglobin 10.1 (*)    HCT 32.2 (*)  RDW 19.9 (*)    Platelets 104 (*)    All other components within normal limits  GLUCOSE, CAPILLARY - Abnormal; Notable for the following components:   Glucose-Capillary 239 (*)    All other components within normal limits     IMAGES: CXR 07/03/23:  IMPRESSION: 1. Low lung volumes with asymmetric elevation of the right hemidiaphragm. 2. Right lung base opacity is favored to represent a combination of small pleural effusion with overlying atelectasis or airspace disease.   EKG 07/07/23  Sinus rhythm  with Premature atrial complexes with Abberant conduction Right bundle branch block Inferior infarct , age undetermined Abnormal ECG  CV:  Echo 07/04/23:  IMPRESSIONS    1. Left ventricular ejection fraction, by estimation, is 40 to 45%. The left ventricle has mildly decreased function. The left ventricle demonstrates regional wall motion abnormalities (see scoring diagram/findings for description). There is mild left ventricular hypertrophy. Left ventricular diastolic parameters are consistent with Grade I diastolic dysfunction (impaired relaxation).  2. Right ventricular systolic function is normal. The right ventricular size is normal.  3. Left atrial size was mildly dilated.  4. The mitral valve is normal in structure. No evidence of mitral valve regurgitation. No evidence of mitral stenosis.  5. The right coronary and left coronary cusps are fused. The aortic valve is tricuspid. There is moderate calcification of the aortic valve. Aortic valve regurgitation is trivial. Mild to moderate aortic valve stenosis. Aortic valve area, by VTI measures 1.25 cm. Aortic valve mean gradient measures 13.3 mmHg. Aortic valve Vmax measures 2.34 m/s.  6. The inferior vena cava is normal in size with greater than 50% respiratory variability, suggesting right atrial pressure of 3 mmHg.  Conclusion(s)/Recommendation(s): EF down from previous echo with new wall motion abnormalities.  LHC 04/25/23:    Mid LAD-1 lesion is 75% stenosed with 70% stenosed side branch in 1st Diag. - just prior to stent.  Mid LAD-2 STENT is 40% stenosed.  Mid LAD-3 lesion is 75% stenosed with 60% stenosed side branch in 2nd Diag at distal edge of STENT   1st Diag lesion is 90% stenosed.   Prox Cx to Mid Cx STENT segment is 45% stenosed with 90% stenosed side branch in 4th Mrg.  Mid Cx to Dist Cx lesion is 95% stenosed just after stent   Dist Cx-1 lesion is 95% stenosed prior to stent. Previously placed Dist Cx-2 DES  STENT is widely patent.   Prox RCA-1 lesion is 70% stenosed.  Prox RCA-2 lesion is 85% stenosed. Prox RCA-3 lesion is 80% stenosed. Prox RCA to Mid RCA lesion is 95% stenosed.   Mid RCA STENT is 35% stenosed.   RPAV lesion is 60% stenosed.   RPDA lesion is 80% stenosed.   There is no aortic valve stenosis.    Severe MV CAD - with moderate ISR in LAD/RCA & LCx but each stent with stent edge lesions.  Best option is CABG --    RECOMMENDATIONS     CVTS Consultation   Recommend Aspirin 81mg  daily for moderate CAD.   D/c Plavix for planned CVTS consult; Have ordered IV Heparin 2 hr post TR Band off  Past Medical History:  Diagnosis Date   Allergic rhinitis    Allergic rhinitis    Anemia    Arthritis    Basal cell carcinoma 11/01/2019    bcc left chest treatment TX [id number] [id number]    CHF (congestive heart failure) (HCC)    Chronic leg pain    right  Chronic lower back pain    Coronary artery disease    a. Stenting to RCA 2004; staged DES to LAD and Cx 2004. DES to [facility] 2012. b. DES to mCx, PTCA to dCx 11/2011. c. Lateral wall MI 2013 s/p PTCA to distal Cx & DES to mid OM2 11/2011. d. Low risk nuc 04/2014, EF wnl.   COVID-19    Diabetes mellitus    Insulin dependent   Diabetic neuropathy (HCC)    MILD   Diverticulosis    Dysrhythmia    [name] syndrome    Gout    right wrist; right foot; right elbow; have had it since 1970's   H/O hiatal hernia    Heart murmur    History of echocardiogram    aortic sclerosis per echo 12/09 EF 65%, otherwise normal   History of hemorrhoids    BLEEDING   History of kidney stones    h/o   Hypertension    Diagnosed 1995    Myocardial infarction [facility])    Pancreatic pseudocyst    a. s/p remote drainage 2006.   Thrombocytopenia (HCC)    Seen on oldest labs in system from 2004   Vitamin B 12 deficiency    orally replaced    Past Surgical History:  Procedure Laterality Date   ABDOMINAL AORTOGRAM W/LOWER EXTREMITY Bilateral 08/08/2020   Procedure:  ABDOMINAL AORTOGRAM W/LOWER EXTREMITY;  Surgeon: Chuck Hint, MD;  Location: [facility] INVASIVE CV LAB;  Service: Cardiovascular;  Laterality: Bilateral;   AMPUTATION Right 12/12/2020   Procedure: RIGHT BELOW KNEE AMPUTATION;  Surgeon: Nadara Mustard, MD;  Location: [facility] OR;  Service: Orthopedics;  Laterality: Right;   BACK SURGERY     "total of 3 times" S/P fall    CARPAL TUNNEL RELEASE Bilateral    CHOLECYSTECTOMY  1990's   COLONOSCOPY     COLONOSCOPY Left 07/08/2023   Procedure: COLONOSCOPY;  Surgeon: Shellia Cleverly, DO;  Location: [facility] ENDOSCOPY;  Service: Gastroenterology;  Laterality: Left;   CORONARY ANGIOPLASTY  11/11/11   CORONARY ANGIOPLASTY WITH STENT PLACEMENT  09/30/2011   "1 then; makes a total of 4"   CORONARY ANGIOPLASTY WITH STENT PLACEMENT  11/11/11   "1; makes a total of 5"   ESOPHAGOGASTRODUODENOSCOPY Left 07/08/2023   Procedure: ESOPHAGOGASTRODUODENOSCOPY (EGD);  Surgeon: Shellia Cleverly, DO;  Location: [facility] ENDOSCOPY;  Service: Gastroenterology;  Laterality: Left;   HEMOSTASIS CLIP PLACEMENT  07/08/2023   Procedure: HEMOSTASIS CLIP PLACEMENT;  Surgeon: Shellia Cleverly, DO;  Location: [facility] ENDOSCOPY;  Service: Gastroenterology;;   HOT HEMOSTASIS N/A 07/08/2023   Procedure: HOT HEMOSTASIS (ARGON PLASMA COAGULATION/BICAP);  Surgeon: Shellia Cleverly, DO;  Location: [facility] ENDOSCOPY;  Service: Gastroenterology;  Laterality: N/A;   INGUINAL HERNIA REPAIR  2003   right   JOINT REPLACEMENT Right 04/03/2002   hip replacment   KNEE ARTHROSCOPY  1990's   left   LEFT HEART CATH AND CORONARY ANGIOGRAPHY N/A 04/25/2023   Procedure: LEFT HEART CATH AND CORONARY ANGIOGRAPHY;  Surgeon: Marykay Lex, MD;  Location: [facility] INVASIVE CV LAB;  Service: Cardiovascular;  Laterality: N/A;   LEFT HEART CATHETERIZATION WITH CORONARY ANGIOGRAM N/A 09/30/2011   Procedure: LEFT HEART CATHETERIZATION WITH CORONARY ANGIOGRAM;  Surgeon: Corky Crafts, MD;  Location: [facility] CATH LAB;  Service:  Cardiovascular;  Laterality: N/A;  possible PCI   LEFT HEART CATHETERIZATION WITH CORONARY ANGIOGRAM N/A 11/15/2011   Procedure: LEFT HEART CATHETERIZATION WITH CORONARY ANGIOGRAM;  Surgeon: Corky Crafts, MD;  Location: [facility] CATH LAB;  Service: Cardiovascular;  Laterality: N/A;   PERCUTANEOUS CORONARY STENT INTERVENTION (PCI-S)  09/30/2011   Procedure: PERCUTANEOUS CORONARY STENT INTERVENTION (PCI-S);  Surgeon: Corky Crafts, MD;  Location: [facility] CATH LAB;  Service: Cardiovascular;;   PERCUTANEOUS CORONARY STENT INTERVENTION (PCI-S) N/A 11/11/2011   Procedure: PERCUTANEOUS CORONARY STENT INTERVENTION (PCI-S);  Surgeon: Corky Crafts, MD;  Location: [facility] CATH LAB;  Service: Cardiovascular;  Laterality: N/A;   PERIPHERAL VASCULAR BALLOON ANGIOPLASTY Right 08/08/2020   Procedure: PERIPHERAL VASCULAR BALLOON ANGIOPLASTY;  Surgeon: Chuck Hint, MD;  Location: [facility] INVASIVE CV LAB;  Service: Cardiovascular;  Laterality: Right;  Posterior tibial    SHOULDER SURGERY Right    X 2   STUMP REVISION Right 01/09/2021   Procedure: REVISION RIGHT BELOW KNEE AMPUTATION;  Surgeon: Nadara Mustard, MD;  Location: [facility] OR;  Service: Orthopedics;  Laterality: Right;   TONSILLECTOMY  ~ 1948   TOTAL HIP REVISION Right 04/13/2019   Procedure: RIGHT TOTAL HIP REVISION-POSTERIOR  APPROACH LATERAL;  Surgeon: Eldred Manges, MD;  Location: [facility] OR;  Service: Orthopedics;  Laterality: Right;   TOTAL KNEE ARTHROPLASTY Left 07/23/2016   Procedure: LEFT TOTAL KNEE ARTHROPLASTY;  Surgeon: Eldred Manges, MD;  Location: [facility] OR;  Service: Orthopedics;  Laterality: Left;    MEDICATIONS:  acetaminophen (TYLENOL) 500 MG tablet   allopurinol (ZYLOPRIM) 300 MG tablet   Ascorbic Acid (VITAMIN C PO)   aspirin EC 81 MG tablet   Cholecalciferol (VITAMIN D3) 50 MCG (2000 UT) TABS   clopidogrel (PLAVIX) 75 MG tablet   clotrimazole-betamethasone (LOTRISONE) cream   Continuous Blood Gluc Sensor (DEXCOM G7 SENSOR) MISC    Cyanocobalamin (B-12) 1000 MCG TABS   empagliflozin (JARDIANCE) 25 MG TABS tablet   furosemide (LASIX) 20 MG tablet   gabapentin (NEURONTIN) 300 MG capsule   gabapentin (NEURONTIN) 400 MG capsule   insulin aspart (NOVOLOG FLEXPEN) 100 UNIT/ML FlexPen   insulin degludec (TRESIBA FLEXTOUCH) 100 UNIT/ML FlexTouch Pen   Insulin Glargine (BASAGLAR KWIKPEN) 100 UNIT/ML   Insulin Pen Needle 32G X 4 MM MISC   isosorbide mononitrate (IMDUR) 60 MG 24 hr tablet   Magnesium 400 MG CAPS   metFORMIN (GLUCOPHAGE) 1000 MG tablet   metoprolol succinate (TOPROL-XL) 25 MG 24 hr tablet   nitroGLYCERIN (NITROSTAT) 0.4 MG SL tablet   Omega-3 Fatty Acids (FISH OIL) 500 MG CAPS   pantoprazole (PROTONIX) 40 MG tablet   pioglitazone (ACTOS) 45 MG tablet   Potassium Chloride ER 20 MEQ TBCR   PRESCRIPTION MEDICATION   rosuvastatin (CRESTOR) 20 MG tablet   sacubitril-valsartan (ENTRESTO) 24-26 MG   senna-docusate (SENOKOT-S) 8.6-50 MG tablet   tamsulosin (FLOMAX) 0.4 MG CAPS capsule   terazosin (HYTRIN) 5 MG capsule   Turmeric (QC TUMERIC COMPLEX PO)   No current facility-administered medications for this encounter.

## 2023-10-03 NOTE — Anesthesia Preprocedure Evaluation (Signed)
Anesthesia Evaluation    Airway        Dental   Pulmonary former smoker          Cardiovascular hypertension,      Neuro/Psych    GI/Hepatic   Endo/Other  diabetes    Renal/GU      Musculoskeletal   Abdominal   Peds  Hematology   Anesthesia Other Findings   Reproductive/Obstetrics                              Anesthesia Physical Anesthesia Plan  ASA:   Anesthesia Plan:    Post-op Pain Management:    Induction:   PONV Risk Score and Plan:   Airway Management Planned:   Additional Equipment:   Intra-op Plan:   Post-operative Plan:   Informed Consent:   Plan Discussed with:   Anesthesia Plan Comments: (See PAT note from 12/26 by Sherlie Ban PA-C )         Anesthesia Quick Evaluation

## 2023-10-10 ENCOUNTER — Telehealth: Payer: Self-pay | Admitting: Orthopedic Surgery

## 2023-10-10 NOTE — Telephone Encounter (Signed)
 Pt's daughter [facility] called requesting order to sent to [employer] for adjustments to pt's right prostatic leg. [employer] told pt he need to call Dr Lajoyce Corners to send in orders. Pt phone number is [phone].

## 2023-10-10 NOTE — Telephone Encounter (Signed)
 Faxed rx and notes to [employer].

## 2023-10-11 DIAGNOSIS — M978XXD Periprosthetic fracture around other internal prosthetic joint, subsequent encounter: Secondary | ICD-10-CM | POA: Diagnosis not present

## 2023-10-11 DIAGNOSIS — Z96659 Presence of unspecified artificial knee joint: Secondary | ICD-10-CM | POA: Diagnosis not present

## 2023-10-11 DIAGNOSIS — M9702XA Periprosthetic fracture around internal prosthetic left hip joint, initial encounter: Secondary | ICD-10-CM | POA: Diagnosis not present

## 2023-10-12 ENCOUNTER — Ambulatory Visit (HOSPITAL_BASED_OUTPATIENT_CLINIC_OR_DEPARTMENT_OTHER): Payer: Self-pay | Admitting: Certified Registered Nurse Anesthetist

## 2023-10-12 ENCOUNTER — Ambulatory Visit (HOSPITAL_COMMUNITY): Payer: Medicare Other | Admitting: Medical

## 2023-10-12 ENCOUNTER — Ambulatory Visit (HOSPITAL_COMMUNITY)
Admission: RE | Admit: 2023-10-12 | Discharge: 2023-10-12 | Disposition: A | Discharge status: Home / Self Care | Payer: Medicare Other | Source: Ambulatory Visit | Attending: Urology | Admitting: Urology

## 2023-10-12 ENCOUNTER — Other Ambulatory Visit (HOSPITAL_COMMUNITY): Payer: Self-pay

## 2023-10-12 ENCOUNTER — Other Ambulatory Visit: Payer: Self-pay

## 2023-10-12 ENCOUNTER — Encounter (HOSPITAL_COMMUNITY): Payer: Self-pay | Admitting: Urology

## 2023-10-12 ENCOUNTER — Encounter (HOSPITAL_COMMUNITY)
Admission: RE | Disposition: A | Discharge status: Home / Self Care | Payer: Self-pay | Source: Ambulatory Visit | Attending: Urology

## 2023-10-12 DIAGNOSIS — G8929 Other chronic pain: Secondary | ICD-10-CM | POA: Diagnosis not present

## 2023-10-12 DIAGNOSIS — Z794 Long term (current) use of insulin: Secondary | ICD-10-CM | POA: Diagnosis not present

## 2023-10-12 DIAGNOSIS — Z7984 Long term (current) use of oral hypoglycemic drugs: Secondary | ICD-10-CM | POA: Insufficient documentation

## 2023-10-12 DIAGNOSIS — I252 Old myocardial infarction: Secondary | ICD-10-CM | POA: Diagnosis not present

## 2023-10-12 DIAGNOSIS — K219 Gastro-esophageal reflux disease without esophagitis: Secondary | ICD-10-CM | POA: Diagnosis not present

## 2023-10-12 DIAGNOSIS — N471 Phimosis: Secondary | ICD-10-CM | POA: Diagnosis not present

## 2023-10-12 DIAGNOSIS — I2510 Atherosclerotic heart disease of native coronary artery without angina pectoris: Secondary | ICD-10-CM

## 2023-10-12 DIAGNOSIS — E1140 Type 2 diabetes mellitus with diabetic neuropathy, unspecified: Secondary | ICD-10-CM | POA: Diagnosis not present

## 2023-10-12 DIAGNOSIS — I700 Atherosclerosis of aorta: Secondary | ICD-10-CM | POA: Insufficient documentation

## 2023-10-12 DIAGNOSIS — Z85828 Personal history of other malignant neoplasm of skin: Secondary | ICD-10-CM | POA: Diagnosis not present

## 2023-10-12 DIAGNOSIS — Z955 Presence of coronary angioplasty implant and graft: Secondary | ICD-10-CM | POA: Insufficient documentation

## 2023-10-12 DIAGNOSIS — Z87891 Personal history of nicotine dependence: Secondary | ICD-10-CM | POA: Insufficient documentation

## 2023-10-12 DIAGNOSIS — Z7902 Long term (current) use of antithrombotics/antiplatelets: Secondary | ICD-10-CM | POA: Insufficient documentation

## 2023-10-12 DIAGNOSIS — N481 Balanitis: Secondary | ICD-10-CM | POA: Insufficient documentation

## 2023-10-12 DIAGNOSIS — I350 Nonrheumatic aortic (valve) stenosis: Secondary | ICD-10-CM | POA: Diagnosis not present

## 2023-10-12 DIAGNOSIS — I110 Hypertensive heart disease with heart failure: Secondary | ICD-10-CM | POA: Diagnosis not present

## 2023-10-12 DIAGNOSIS — I509 Heart failure, unspecified: Secondary | ICD-10-CM | POA: Diagnosis not present

## 2023-10-12 DIAGNOSIS — I4891 Unspecified atrial fibrillation: Secondary | ICD-10-CM | POA: Insufficient documentation

## 2023-10-12 DIAGNOSIS — I5043 Acute on chronic combined systolic (congestive) and diastolic (congestive) heart failure: Secondary | ICD-10-CM | POA: Diagnosis not present

## 2023-10-12 HISTORY — PX: CIRCUMCISION: SHX1350

## 2023-10-12 LAB — GLUCOSE, CAPILLARY
Glucose-Capillary: 149 mg/dL — ABNORMAL HIGH (ref 70–99)
Glucose-Capillary: 145 mg/dL — ABNORMAL HIGH (ref 70–99)
Glucose-Capillary: 149 mg/dL — ABNORMAL HIGH (ref 70–99)

## 2023-10-12 SURGERY — CIRCUMCISION, ADULT
Anesthesia: General

## 2023-10-12 MED ORDER — ORAL CARE MOUTH RINSE: 15.0000 mL | Freq: Once | OROMUCOSAL | Status: AC

## 2023-10-12 MED ORDER — EPHEDRINE SULFATE-NACL 50-0.9 MG/10ML-% IV SOSY
PREFILLED_SYRINGE | INTRAVENOUS | Status: DC | PRN
  Administered 2023-10-12 (×7): 5 mg via INTRAVENOUS

## 2023-10-12 MED ORDER — ACETAMINOPHEN 500 MG PO TABS
1000.0000 mg | ORAL_TABLET | Freq: Once | ORAL | Status: AC
  Administered 2023-10-12: 1000 mg via ORAL
  Filled 2023-10-12: qty 2

## 2023-10-12 MED ORDER — LACTATED RINGERS IV SOLN: INTRAVENOUS | Status: DC | PRN

## 2023-10-12 MED ORDER — OXYCODONE HCL 5 MG/5ML PO SOLN: 5.0000 mg | Freq: Once | ORAL | Status: DC | PRN

## 2023-10-12 MED ORDER — OXYCODONE HCL 5 MG PO TABS: 5.0000 mg | ORAL_TABLET | Freq: Once | ORAL | Status: DC | PRN

## 2023-10-12 MED ORDER — FENTANYL CITRATE (PF) 100 MCG/2ML IJ SOLN
INTRAMUSCULAR | Status: DC | PRN
  Administered 2023-10-12: 25 ug via INTRAVENOUS

## 2023-10-12 MED ORDER — BUPIVACAINE HCL (PF) 0.25 % IJ SOLN
INTRAMUSCULAR | Status: DC | PRN
  Administered 2023-10-12: 9.5 mL

## 2023-10-12 MED ORDER — PROPOFOL 10 MG/ML IV BOLUS
INTRAVENOUS | Status: DC | PRN
  Administered 2023-10-12: 50 mg via INTRAVENOUS

## 2023-10-12 MED ORDER — ETOMIDATE 2 MG/ML IV SOLN
INTRAVENOUS | Status: AC
  Filled 2023-10-12: qty 10

## 2023-10-12 MED ORDER — PROPOFOL 10 MG/ML IV BOLUS
INTRAVENOUS | Status: AC
  Filled 2023-10-12: qty 20

## 2023-10-12 MED ORDER — LIDOCAINE HCL (CARDIAC) PF 100 MG/5ML IV SOSY
PREFILLED_SYRINGE | INTRAVENOUS | Status: DC | PRN
  Administered 2023-10-12: 80 mg via INTRAVENOUS

## 2023-10-12 MED ORDER — ACETAMINOPHEN 500 MG PO TABS
1000.0000 mg | ORAL_TABLET | Freq: Four times a day (QID) | ORAL | 0 refills | Status: DC
  Filled 2023-10-12: qty 50, 7d supply, fill #0

## 2023-10-12 MED ORDER — FENTANYL CITRATE PF 50 MCG/ML IJ SOSY: 25.0000 ug | PREFILLED_SYRINGE | INTRAMUSCULAR | Status: DC | PRN

## 2023-10-12 MED ORDER — LIDOCAINE HCL (PF) 1 % IJ SOLN
INTRAMUSCULAR | Status: AC
  Filled 2023-10-12: qty 30

## 2023-10-12 MED ORDER — PHENYLEPHRINE HCL-NACL 20-0.9 MG/250ML-% IV SOLN
INTRAVENOUS | Status: AC
  Filled 2023-10-12: qty 250

## 2023-10-12 MED ORDER — BUPIVACAINE HCL (PF) 0.25 % IJ SOLN
INTRAMUSCULAR | Status: AC
  Filled 2023-10-12: qty 30

## 2023-10-12 MED ORDER — INSULIN ASPART 100 UNIT/ML IJ SOLN: 0.0000 [IU] | INTRAMUSCULAR | Status: DC | PRN

## 2023-10-12 MED ORDER — EPHEDRINE 5 MG/ML INJ
INTRAVENOUS | Status: AC
  Filled 2023-10-12: qty 10

## 2023-10-12 MED ORDER — TRAMADOL HCL 50 MG PO TABS
50.0000 mg | ORAL_TABLET | Freq: Four times a day (QID) | ORAL | 0 refills | Status: DC | PRN
  Filled 2023-10-12: qty 20, 5d supply, fill #0

## 2023-10-12 MED ORDER — LIDOCAINE HCL 1 % IJ SOLN
INTRAMUSCULAR | Status: DC | PRN
  Administered 2023-10-12: 9.5 mL

## 2023-10-12 MED ORDER — ONDANSETRON HCL 4 MG/2ML IJ SOLN
INTRAMUSCULAR | Status: AC
  Filled 2023-10-12: qty 2

## 2023-10-12 MED ORDER — LIDOCAINE HCL (PF) 2 % IJ SOLN
INTRAMUSCULAR | Status: AC
  Filled 2023-10-12: qty 5

## 2023-10-12 MED ORDER — DEXAMETHASONE SODIUM PHOSPHATE 10 MG/ML IJ SOLN
INTRAMUSCULAR | Status: AC
  Filled 2023-10-12: qty 1

## 2023-10-12 MED ORDER — FENTANYL CITRATE (PF) 100 MCG/2ML IJ SOLN
INTRAMUSCULAR | Status: AC
  Filled 2023-10-12: qty 2

## 2023-10-12 MED ORDER — CHLORHEXIDINE GLUCONATE 0.12 % MT SOLN
15.0000 mL | Freq: Once | OROMUCOSAL | Status: AC
  Administered 2023-10-12: 15 mL via OROMUCOSAL

## 2023-10-12 MED ORDER — DEXAMETHASONE SODIUM PHOSPHATE 4 MG/ML IJ SOLN
INTRAMUSCULAR | Status: DC | PRN
  Administered 2023-10-12: 8 mg via INTRAVENOUS

## 2023-10-12 MED ORDER — CEFAZOLIN SODIUM-DEXTROSE 2-4 GM/100ML-% IV SOLN
2.0000 g | INTRAVENOUS | Status: AC
  Administered 2023-10-12: 2 g via INTRAVENOUS
  Filled 2023-10-12: qty 100

## 2023-10-12 SURGICAL SUPPLY — 25 items
BAG COUNTER SPONGE SURGICOUNT (BAG) IMPLANT
BLADE SURG 15 STRL LF DISP TIS (BLADE) ×1 IMPLANT
BNDG COHESIVE 1.5X5 TAN NS LF (GAUZE/BANDAGES/DRESSINGS) ×1 IMPLANT
COVER SURGICAL LIGHT HANDLE (MISCELLANEOUS) ×1 IMPLANT
DRAPE LAPAROTOMY T 98X78 PEDS (DRAPES) ×1 IMPLANT
ELECT NDL TIP 2.8 STRL (NEEDLE) ×1 IMPLANT
ELECT NEEDLE TIP 2.8 STRL (NEEDLE) ×1
ELECT PENCIL ROCKER SW 15FT (MISCELLANEOUS) ×1 IMPLANT
ELECT REM PT RETURN 15FT ADLT (MISCELLANEOUS) ×1 IMPLANT
GAUZE PETROLATUM 1 X8 (GAUZE/BANDAGES/DRESSINGS) ×1 IMPLANT
GAUZE STRETCH 2X75IN STRL (MISCELLANEOUS) ×1 IMPLANT
GLOVE SS BIOGEL STRL SZ 7 (GLOVE) ×1 IMPLANT
GLOVE SURG LX STRL 7.5 STRW (GLOVE) ×1 IMPLANT
GOWN STRL REUS W/ TWL XL LVL3 (GOWN DISPOSABLE) ×1 IMPLANT
KIT BASIN OR (CUSTOM PROCEDURE TRAY) ×1 IMPLANT
KIT TURNOVER KIT A (KITS) IMPLANT
NS IRRIG 1000ML POUR BTL (IV SOLUTION) ×1 IMPLANT
PACK BASIC VI WITH GOWN DISP (CUSTOM PROCEDURE TRAY) ×1 IMPLANT
SUT CHROMIC 3 0 SH 27 (SUTURE) ×2 IMPLANT
SUT CHROMIC 4 0 RB 1X27 (SUTURE) ×1 IMPLANT
SUT PROLENE 4 0 P 3 18 (SUTURE) IMPLANT
SYR CONTROL 10ML LL (SYRINGE) ×1 IMPLANT
TOWEL OR 17X26 10 PK STRL BLUE (TOWEL DISPOSABLE) IMPLANT
WATER STERILE IRR 1000ML POUR (IV SOLUTION) IMPLANT
YANKAUER SUCT BULB TIP 10FT TU (MISCELLANEOUS) ×1 IMPLANT

## 2023-10-12 NOTE — Anesthesia Procedure Notes (Signed)
 Procedure Name: LMA Insertion Date/Time: 10/12/2023 11:15 AM  Performed by: Joshua Vernell BROCKS, CRNAPre-anesthesia Checklist: Patient identified, Emergency Drugs available, Suction available and Patient being monitored Patient Re-evaluated:Patient Re-evaluated prior to induction Oxygen Delivery Method: Circle system utilized Preoxygenation: Pre-oxygenation with 100% oxygen Induction Type: IV induction Ventilation: Mask ventilation without difficulty LMA: LMA with gastric port inserted LMA Size: 4.0 Number of attempts: 1 Placement Confirmation: positive ETCO2 and breath sounds checked- equal and bilateral Tube secured with: Tape Dental Injury: Teeth and Oropharynx as per pre-operative assessment

## 2023-10-12 NOTE — Anesthesia Postprocedure Evaluation (Signed)
 Anesthesia Post Note  Patient: Keith Espinoza  Procedure(s) Performed: Dorsal slit     Patient location during evaluation: PACU Anesthesia Type: General Level of consciousness: awake Pain management: pain level controlled Vital Signs Assessment: post-procedure vital signs reviewed and stable Respiratory status: spontaneous breathing, nonlabored ventilation and respiratory function stable Cardiovascular status: blood pressure returned to baseline and stable Postop Assessment: no apparent nausea or vomiting Anesthetic complications: no   No notable events documented.  Last Vitals:  Vitals:   10/12/23 1231 10/12/23 1242  BP: 134/61 133/60  Pulse: (!) 58 60  Resp: 15 13  Temp:  36.6 C  SpO2: 97% 96%    Last Pain:  Vitals:   10/12/23 1242  TempSrc:   PainSc: 0-No pain                 Delon Aisha Arch

## 2023-10-12 NOTE — Op Note (Signed)
 Pre-operative diagnosis: Phimosis Balanitis  Postoperative diagnosis: same  Procedure:  Dorsal slit Penile block  Surgeon: Herlene Foot. M.D.  Anesthesia: General  Complications: None  EBL: Minimal  Specimens: none  Indication: Keith Espinoza is a 83 y.o. male with phimosis making it difficult for him to urinate and causing balanitis.  He elected to proceed with circumcision for treatment. The potential risks, complications, alternative options, and expected recovery process was discussed in detail and informed consent was obtained.  Description of procedure: The patient was taken to the operating room and a general anesthetic was administered. He was given preoperative antibiotics, placed in the supine position, and his genitalia was prepped and draped in the usual sterile fashion. Next, a preoperative timeout was performed.  A dorsal and circumferential penile block was done using a 50-50 mixture of 0.25 bupivacaine  and 1% lidocaine .   An approximately 3 cm dorsal slit was carefully measured and made with Tamra. The bovie was used for hemostasis, and the leaflets of the slit were closed with running 3-0 chromic.  The head of the penis was exposed very well  A gauze dressing was then placed and secured with Coband.   The patient tolerated the procedure well and without complications. He was able to be awakened and transferred to the recovery unit in satisfactory condition.  Herlene Foot MD 10/12/2023, 11:49 AM  [facility]  Pager: [phone]

## 2023-10-12 NOTE — Transfer of Care (Signed)
 Immediate Anesthesia Transfer of Care Note  Patient: Keith Espinoza  Procedure(s) Performed: Dorsal slit  Patient Location: PACU  Anesthesia Type:General  Level of Consciousness: drowsy  Airway & Oxygen Therapy: Patient Spontanous Breathing and Patient connected to face mask oxygen  Post-op Assessment: Report given to RN, Post -op Vital signs reviewed and stable, and Patient moving all extremities X 4  Post vital signs: Reviewed and stable  Last Vitals:  Vitals Value Taken Time  BP 141/64   Temp    Pulse 60 10/12/23 1201  Resp 12   SpO2 100 % 10/12/23 1201  Vitals shown include unfiled device data.  Last Pain:  Vitals:   10/12/23 0825  TempSrc:   PainSc: 0-No pain         Complications: No notable events documented.

## 2023-10-12 NOTE — H&P (Addendum)
 H&P  History of Present Illness: Keith Espinoza is a 83 y.o. year old M who presents today for circumcision.  No acute complaints  Past Medical History:  Diagnosis Date   Allergic rhinitis    Anemia    Aortic stenosis    Arthritis    Basal cell carcinoma 11/01/2019    bcc left chest treatment TX cx3 55fu    CHF (congestive heart failure) (HCC)    Chronic leg pain    right   Chronic lower back pain    Coronary artery disease    a. Stenting to RCA 2004; staged DES to LAD and Cx 2004. DES to [facility] 2012. b. DES to mCx, PTCA to dCx 11/2011. c. Lateral wall MI 2013 s/p PTCA to distal Cx & DES to mid OM2 11/2011. d. Low risk nuc 04/2014, EF wnl.   COVID-19    Diabetes mellitus    Insulin  dependent   Diabetic neuropathy (HCC)    MILD   Diverticulosis    Dysrhythmia    Bertrum syndrome    Gout    right wrist; right foot; right elbow; have had it since 1970's   H/O hiatal hernia    Heart murmur    History of echocardiogram    aortic sclerosis per echo 12/09 EF 65%, otherwise normal   History of hemorrhoids    BLEEDING   History of kidney stones    h/o   Hypertension    Diagnosed 1995    Myocardial infarction [facility])    Pancreatic pseudocyst    a. s/p remote drainage 2006.   Thrombocytopenia (HCC)    Seen on oldest labs in system from 2004   Vitamin B 12 deficiency    orally replaced    Past Surgical History:  Procedure Laterality Date   ABDOMINAL AORTOGRAM W/LOWER EXTREMITY Bilateral 08/08/2020   Procedure: ABDOMINAL AORTOGRAM W/LOWER EXTREMITY;  Surgeon: Eliza Lonni RAMAN, MD;  Location: [facility] INVASIVE CV LAB;  Service: Cardiovascular;  Laterality: Bilateral;   AMPUTATION Right 12/12/2020   Procedure: RIGHT BELOW KNEE AMPUTATION;  Surgeon: Harden Jerona GAILS, MD;  Location: [facility] OR;  Service: Orthopedics;  Laterality: Right;   BACK SURGERY     total of 3 times S/P fall    CARPAL TUNNEL RELEASE Bilateral    CHOLECYSTECTOMY  1990's   COLONOSCOPY     COLONOSCOPY Left  07/08/2023   Procedure: COLONOSCOPY;  Surgeon: San Sandor GAILS, DO;  Location: [facility] ENDOSCOPY;  Service: Gastroenterology;  Laterality: Left;   CORONARY ANGIOPLASTY  11/11/11   CORONARY ANGIOPLASTY WITH STENT PLACEMENT  09/30/2011   1 then; makes a total of 4   CORONARY ANGIOPLASTY WITH STENT PLACEMENT  11/11/11   1; makes a total of 5   ESOPHAGOGASTRODUODENOSCOPY Left 07/08/2023   Procedure: ESOPHAGOGASTRODUODENOSCOPY (EGD);  Surgeon: San Sandor GAILS, DO;  Location: [facility] ENDOSCOPY;  Service: Gastroenterology;  Laterality: Left;   HEMOSTASIS CLIP PLACEMENT  07/08/2023   Procedure: HEMOSTASIS CLIP PLACEMENT;  Surgeon: San Sandor GAILS, DO;  Location: [facility] ENDOSCOPY;  Service: Gastroenterology;;   HOT HEMOSTASIS N/A 07/08/2023   Procedure: HOT HEMOSTASIS (ARGON PLASMA COAGULATION/BICAP);  Surgeon: San Sandor GAILS, DO;  Location: [facility] ENDOSCOPY;  Service: Gastroenterology;  Laterality: N/A;   INGUINAL HERNIA REPAIR  2003   right   JOINT REPLACEMENT Right 04/03/2002   hip replacment   KNEE ARTHROSCOPY  1990's   left   LEFT HEART CATH AND CORONARY ANGIOGRAPHY N/A 04/25/2023   Procedure: LEFT HEART CATH AND CORONARY ANGIOGRAPHY;  Surgeon:  Anner Alm ORN, MD;  Location: [facility] INVASIVE CV LAB;  Service: Cardiovascular;  Laterality: N/A;   LEFT HEART CATHETERIZATION WITH CORONARY ANGIOGRAM N/A 09/30/2011   Procedure: LEFT HEART CATHETERIZATION WITH CORONARY ANGIOGRAM;  Surgeon: Candyce GORMAN Reek, MD;  Location: [facility] CATH LAB;  Service: Cardiovascular;  Laterality: N/A;  possible PCI   LEFT HEART CATHETERIZATION WITH CORONARY ANGIOGRAM N/A 11/15/2011   Procedure: LEFT HEART CATHETERIZATION WITH CORONARY ANGIOGRAM;  Surgeon: Candyce GORMAN Reek, MD;  Location: [facility] CATH LAB;  Service: Cardiovascular;  Laterality: N/A;   PERCUTANEOUS CORONARY STENT INTERVENTION (PCI-S)  09/30/2011   Procedure: PERCUTANEOUS CORONARY STENT INTERVENTION (PCI-S);  Surgeon: Candyce GORMAN Reek, MD;  Location: [facility] CATH LAB;  Service:  Cardiovascular;;   PERCUTANEOUS CORONARY STENT INTERVENTION (PCI-S) N/A 11/11/2011   Procedure: PERCUTANEOUS CORONARY STENT INTERVENTION (PCI-S);  Surgeon: Candyce GORMAN Reek, MD;  Location: [facility] CATH LAB;  Service: Cardiovascular;  Laterality: N/A;   PERIPHERAL VASCULAR BALLOON ANGIOPLASTY Right 08/08/2020   Procedure: PERIPHERAL VASCULAR BALLOON ANGIOPLASTY;  Surgeon: Eliza Lonni GORMAN, MD;  Location: [facility] INVASIVE CV LAB;  Service: Cardiovascular;  Laterality: Right;  Posterior tibial    SHOULDER SURGERY Right    X 2   STUMP REVISION Right 01/09/2021   Procedure: REVISION RIGHT BELOW KNEE AMPUTATION;  Surgeon: Harden Jerona GAILS, MD;  Location: [facility] OR;  Service: Orthopedics;  Laterality: Right;   TONSILLECTOMY  ~ 1948   TOTAL HIP REVISION Right 04/13/2019   Procedure: RIGHT TOTAL HIP REVISION-POSTERIOR  APPROACH LATERAL;  Surgeon: Barbarann Oneil BROCKS, MD;  Location: [facility] OR;  Service: Orthopedics;  Laterality: Right;   TOTAL KNEE ARTHROPLASTY Left 07/23/2016   Procedure: LEFT TOTAL KNEE ARTHROPLASTY;  Surgeon: Oneil BROCKS Barbarann, MD;  Location: [facility] OR;  Service: Orthopedics;  Laterality: Left;    Home Medications:  Current Meds  Medication Sig   acetaminophen  (TYLENOL ) 500 MG tablet Take 1,000 mg by mouth every 6 (six) hours as needed (pain).   allopurinol  (ZYLOPRIM ) 300 MG tablet Take 300 mg by mouth daily.   Ascorbic Acid (VITAMIN C PO) Take 1,000 mcg by mouth at bedtime.   aspirin  EC 81 MG tablet Take 81 mg by mouth daily. Swallow whole.   Cholecalciferol  (VITAMIN D3) 50 MCG (2000 UT) TABS Take 2,000 Units by mouth at bedtime.   clopidogrel  (PLAVIX ) 75 MG tablet Take 1 tablet (75 mg total) by mouth daily.   Cyanocobalamin  (B-12) 1000 MCG TABS Take 1,000 mcg by mouth daily.   empagliflozin  (JARDIANCE ) 25 MG TABS tablet TAKE 1 TABLET(25 MG) BY MOUTH DAILY   furosemide  (LASIX ) 20 MG tablet Take 1 tablet (20 mg total) by mouth daily. (Patient taking differently: Take 40 mg by mouth daily.)   gabapentin   (NEURONTIN ) 300 MG capsule Take 600 mg by mouth at bedtime.   gabapentin  (NEURONTIN ) 400 MG capsule Take 1 capsule (400 mg total) by mouth daily.   insulin  aspart (NOVOLOG  FLEXPEN) 100 UNIT/ML FlexPen Max daily 30 units (Patient taking differently: Inject 2-5 Units into the skin daily as needed for high blood sugar. Per sliding scale)   insulin  degludec (TRESIBA  FLEXTOUCH) 100 UNIT/ML FlexTouch Pen Inject 12 Units into the skin daily. (Patient taking differently: Inject 10-12 Units into the skin daily. According to blood sugar)   isosorbide  mononitrate (IMDUR ) 60 MG 24 hr tablet Take 1 tablet (60 mg total) by mouth daily.   Magnesium  400 MG CAPS Take 400 mg by mouth at bedtime.   metFORMIN  (GLUCOPHAGE ) 1000 MG tablet Take 1 tablet (1,000 mg total)  by mouth 2 (two) times daily.   metoprolol  succinate (TOPROL -XL) 25 MG 24 hr tablet Take 1 tablet (25 mg total) by mouth daily.   nitroGLYCERIN  (NITROSTAT ) 0.4 MG SL tablet Place 0.4 mg under the tongue every 5 (five) minutes x 3 doses as needed for chest pain.    Omega-3 Fatty Acids (FISH OIL) 500 MG CAPS Take 500 mg by mouth daily.   pioglitazone  (ACTOS ) 45 MG tablet Take 45 mg by mouth daily.   Potassium Chloride  ER 20 MEQ TBCR Take 1 tablet (20 mEq total) by mouth daily.   PRESCRIPTION MEDICATION Apply 1 application  topically every 3 (three) days. Duraderm   rosuvastatin  (CRESTOR ) 20 MG tablet Take 1 tablet (20 mg total) by mouth daily.   sacubitril -valsartan  (ENTRESTO ) 24-26 MG Take 1 tablet by mouth 2 (two) times daily.   senna-docusate (SENOKOT-S) 8.6-50 MG tablet Take 1 tablet by mouth at bedtime. (Patient taking differently: Take 1 tablet by mouth 2 (two) times daily.)   terazosin  (HYTRIN ) 5 MG capsule Take 5 mg by mouth 2 (two) times daily.   Turmeric (QC TUMERIC COMPLEX PO) Take 500 mg by mouth at bedtime.    Allergies:  Allergies  Allergen Reactions   Simvastatin Other (See Comments)    SEVERE MYALGIAS    Zetia [Ezetimibe] Other (See  Comments)    MYALGIAS   Dilaudid  [Hydromorphone  Hcl] Other (See Comments)    hallucination    Family History  Problem Relation Age of Onset   Diabetes Mother    Hyperlipidemia Mother    Hypertension Mother    Cancer Father    Hypertension Father    Diabetes Sister    Hypertension Sister    Cancer Brother    Heart attack Neg Hx     Social History:  reports that he has quit smoking. His smoking use included cigarettes. His smokeless tobacco use includes chew. He reports that he does not drink alcohol and does not use drugs.  ROS: A complete review of systems was performed.  All systems are negative except for pertinent findings as noted.  Physical Exam:  Vital signs in last 24 hours: Temp:  [98 F (36.7 C)] 98 F (36.7 C) (01/08 0809) Pulse Rate:  [53] 53 (01/08 0809) Resp:  [16] 16 (01/08 0809) BP: (120)/(56) 120/56 (01/08 0809) SpO2:  [95 %] 95 % (01/08 0809) Constitutional:  Alert and oriented, No acute distress Cardiovascular: Regular rate and rhythm Respiratory: Normal respiratory effort, Lungs clear bilaterally GI: Abdomen is soft, nontender, nondistended, no abdominal masses Lymphatic: No lymphadenopathy Neurologic: Grossly intact, no focal deficits Psychiatric: Normal mood and affect   Laboratory Data:  No results for input(s): WBC, HGB, HCT, PLT in the last 72 hours.  No results for input(s): NA, K, CL, GLUCOSE, BUN, CALCIUM , CREATININE in the last 72 hours.  Invalid input(s): CO3   Results for orders placed or performed during the hospital encounter of 10/12/23 (from the past 24 hours)  Glucose, capillary     Status: Abnormal   Collection Time: 10/12/23  8:15 AM  Result Value Ref Range   Glucose-Capillary 149 (H) 70 - 99 mg/dL   Comment 1 Notify RN    Comment 2 Document in Chart    No results found for this or any previous visit (from the past 240 hours).  Renal Function: No results for input(s): CREATININE in the last 168  hours. Estimated Creatinine Clearance: 51.1 mL/min (by C-G formula based on SCr of 1.13 mg/dL).  Radiologic Imaging:  No results found.  Assessment:  Keith Espinoza is a 83 y.o. year old M with phimosis  Plan:  To OR. I discussed with patient and daughter dorsal slit vs circumcision, including pros/cons of each approach. At this time, patients main goal is to void without issue and is not concerned with the appearance of his penis. After our discussion, he and his daughter expressed a preference for a dorsal slit. Procedure and risks reviewed (bleeding, infection, pain, wound healing issues, need for circumcision, removing too much/too little skin)  Herlene Foot, MD 10/12/2023, 8:22 AM  [facility] Pager: [phone]

## 2023-10-12 NOTE — Discharge Instructions (Addendum)
 Postoperative instructions for circumcision  Wound:  Remove the dressing the morning after surgery. In most cases your incision will have absorbable sutures that run along the course of your incision and will dissolve within the first 10-20 days. Some will fall out even earlier. Expect some redness as the sutures dissolved but this should occur only around the sutures. If there is generalized redness, especially with increasing pain or swelling, let us know. The penis will possibly get "black and blue" as the blood in the tissues spread. Sometimes the whole penis will turn colors. The black and blue is followed by a yellow and brown color. In time, all the discoloration will go away.  Diet:  You may return to your normal diet within 24 hours following your surgery. You may note some mild nausea and possibly vomiting the first 6-8 hours following surgery. This is usually due to the side effects of anesthesia, and will disappear quite soon. I would suggest clear liquids and a very light meal the first evening following your surgery.  Activity:  Your physical activity should be restricted the first 48 hours. During that time you should remain relatively inactive, moving about only when necessary. During the first 2 weeks following surgery he should avoid lifting any heavy objects (anything greater than 15 pounds), and avoid strenuous exercise. If you work, ask [place] specifically about your restrictions, both for work and home. We will write a note to your employer if needed.   Do not "use" your penis for 3 weeks or until the incisions are completely healed, whichever comes later.  Ice packs can be placed on and off over the penis for the first 48 hours to help relieve the pain and keep the swelling down. Frozen peas or corn in a ZipLock bag can be frozen, used and re-frozen. Fifteen minutes on and 15 minutes off is a reasonable schedule.  No sexual activity for 1 month.  Hygiene:  You may shower 24  hours after your surgery. Make sure wound is clean and dry afterwards. Tub bathing should be restricted until the seventh day.  Medication:  You will be sent home with some type of pain medication. In many cases you will be sent home with a narcotic pain pill (Vicodin or Tylox). If the pain is not too bad, you may take either Tylenol (acetaminophen) or Advil (ibuprofen) which contain no narcotic agents, and might be tolerated a little better, with fewer side effects. If the pain medication you are sent home with does not control the pain, you will have to let us know. Some narcotic pain medications cannot be given or refilled by a phone call to a pharmacy.  Problems you should report to [place]:  Fever of 101.0 degrees Fahrenheit or greater. Moderate or severe swelling under the skin incision or involving the scrotum. Drug reaction such as hives, a rash, nausea or vomiting.  Difficulty voiding

## 2023-10-13 ENCOUNTER — Encounter (HOSPITAL_COMMUNITY): Payer: Self-pay | Admitting: Urology

## 2023-10-18 ENCOUNTER — Encounter: Payer: Self-pay | Admitting: Orthopedic Surgery

## 2023-10-18 ENCOUNTER — Ambulatory Visit: Payer: Medicare Other | Admitting: Orthopedic Surgery

## 2023-10-18 DIAGNOSIS — Z89511 Acquired absence of right leg below knee: Secondary | ICD-10-CM | POA: Diagnosis not present

## 2023-10-18 NOTE — Progress Notes (Signed)
 Office Visit Note   Patient: Keith Espinoza           Date of Birth: 07-07-1941           MRN: [id number] Visit Date: 10/18/2023              Requested by: Charlott Dorn LABOR, MD [address] [place],  [place] [address] PCP: Charlott Dorn LABOR, MD  Chief Complaint  Patient presents with   Right Leg - Follow-up    HX right BKA       HPI: Patient is an 83 year old gentleman who is seen for evaluation for ulcers right transtibial amputation from improper fitting prosthesis.  Assessment & Plan: Visit Diagnoses:  1. Right below-knee amputee [facility])     Plan: Patient was provided a prescription for [employer] for new liner supplies and socket if necessary.  Follow-Up Instructions: Return in about 2 months (around 12/16/2023).   Ortho Exam  Patient is alert, oriented, no adenopathy, well-dressed, normal affect, normal respiratory effort. Examination of the ulcer over the tibial tubercle and fibular head are improving.  There is no cellulitis the ulcers are smaller.  No exposed bone or tendon.  Patient is an existing right transtibial  amputee.  Patient's current comorbidities are not expected to impact the ability to function with the prescribed prosthesis. Patient verbally communicates a strong desire to use a prosthesis. Patient currently requires mobility aids to ambulate without a prosthesis.  Expects not to use mobility aids with a new prosthesis.  Patient is a K2 level ambulator that will use a prosthesis to walk around their home and the community over low level environmental barriers.      Imaging: No results found. No images are attached to the encounter.  Labs: Lab Results  Component Value Date   HGBA1C 9.0 (H) 09/29/2023   HGBA1C 7.1 (A) 04/01/2023   HGBA1C 7.2 (A) 11/11/2022   REPTSTATUS 07/08/2023 FINAL 07/03/2023   GRAMSTAIN  04/13/2019    RARE WBC PRESENT, PREDOMINANTLY PMN NO ORGANISMS SEEN    CULT  07/03/2023    NO GROWTH  5 DAYS Performed at [facility], [address] [place], [place] [address]      Lab Results  Component Value Date   ALBUMIN  2.6 (L) 07/04/2023   ALBUMIN  2.3 (L) 07/03/2023   ALBUMIN  3.4 (L) 04/24/2023    Lab Results  Component Value Date   MG 2.0 07/08/2023   MG 1.9 07/07/2023   MG 1.8 07/06/2023   No results found for: VD25OH  No results found for: PREALBUMIN    Latest Ref Rng & Units 09/29/2023   10:18 AM 07/10/2023    2:27 AM 07/09/2023    2:25 AM  CBC EXTENDED  WBC 4.0 - 10.5 K/uL 4.5  6.4  8.6   RBC 4.22 - 5.81 MIL/uL 3.29  3.32  3.50   Hemoglobin 13.0 - 17.0 g/dL 89.8  9.6  9.7   HCT 60.9 - 52.0 % 32.2  30.5  31.4   Platelets 150 - 400 K/uL 104  110  141   NEUT# 1.7 - 7.7 K/uL   6.2   Lymph# 0.7 - 4.0 K/uL   1.3      There is no height or weight on file to calculate BMI.  Orders:  No orders of the defined types were placed in this encounter.  No orders of the defined types were placed in this encounter.    Procedures: No procedures performed  Clinical Data: No additional findings.  ROS:  All other systems negative, except as noted in the HPI. Review of Systems  Objective: Vital Signs: There were no vitals taken for this visit.  Specialty Comments:  No specialty comments available.  PMFS History: Patient Active Problem List   Diagnosis Date Noted   Acute on chronic combined systolic and diastolic HF (heart failure) (HCC) 07/09/2023   Gastric AVM 07/08/2023   Diverticulosis of colon without hemorrhage 07/08/2023   Gastrointestinal hemorrhage associated with acute gastritis 07/08/2023   PSVT (paroxysmal supraventricular tachycardia) (HCC) 07/08/2023   Paroxysmal A-fib (HCC) 07/08/2023   Grade I diastolic dysfunction 07/08/2023   Acute on chronic anemia 07/07/2023   New onset atrial fibrillation (HCC) 07/07/2023   Ischemic cardiomyopathy 07/07/2023   Acute systolic CHF (congestive heart failure) (HCC) 07/07/2023   Pressure  injury of skin 07/07/2023   Blister 07/06/2023   Edema of both lower extremities 07/05/2023   CHF exacerbation (HCC) 07/05/2023   Iron  deficiency anemia 07/05/2023   Shortness of breath 07/03/2023   Finger numbness 05/05/2023   Elevated troponin 04/25/2023   Chest pain of uncertain etiology 04/23/2023   Unstable angina (HCC) 04/23/2023   AKI (acute kidney injury) (HCC) 04/23/2023   History of CAD (coronary artery disease) 04/23/2023   Contusion of right hip 03/08/2023   Type 2 diabetes mellitus with hyperglycemia, with long-term current use of insulin  (HCC) 02/15/2022   Adenoma of left adrenal gland 02/15/2022   History of right below knee amputation (HCC) 10/06/2021   Exudative age-related macular degeneration of left eye with active choroidal neovascularization (HCC) 07/28/2021   Exudative age-related macular degeneration of right eye with active choroidal neovascularization (HCC) 06/01/2021   Intermediate stage nonexudative age-related macular degeneration of both eyes 06/01/2021   Type 2 diabetes mellitus with diabetic polyneuropathy, with long-term current use of insulin  (HCC) 03/24/2021   Constipation 02/02/2021   Fecal impaction (HCC) 02/01/2021   Wound dehiscence 01/09/2021   Dehiscence of amputation stump (HCC)    Status post percutaneous transluminal coronary angioplasty 01/06/2021   Type II diabetes mellitus, uncontrolled 01/06/2021   Acute pancreatitis 01/06/2021   Diabetic peripheral vascular disease (HCC) 01/06/2021   Encounter for screening for other disorder 01/06/2021   Enlarged prostate 01/06/2021   Foot ulcer, right (HCC) 01/06/2021   Gout 01/06/2021   Headache 01/06/2021   Neck pain 01/06/2021   Hypoglycemia 01/06/2021   Loss of appetite 01/06/2021   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   Peripheral neuropathy 01/06/2021   Sciatica 01/06/2021   Vitamin B12 deficiency 01/06/2021   Vitamin D  deficiency 01/06/2021   Weakness 01/06/2021   Diabetes  mellitus type 2 with neurological manifestations (HCC) 01/06/2021   Protein-calorie malnutrition, severe 12/26/2020   Acute blood loss anemia 12/26/2020   Prerenal azotemia 12/26/2020   Below-knee amputation of right lower extremity (HCC) 12/12/2020   Gangrene of right foot (HCC)    Diabetic neuropathy (HCC) 11/17/2020   Hyperglycemia due to type 2 diabetes mellitus (HCC) 11/17/2020   Long term (current) use of insulin  (HCC) 11/17/2020   Obesity 11/17/2020   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/2019   Hip dislocation, right (HCC) 04/13/2019   Other intervertebral disc degeneration, lumbar region 03/30/2019   Coronary artery disease 01/30/2019   Tobacco abuse 01/30/2019   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   Burn, foot, second degree, left, initial encounter 06/08/2017   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 03/16/2017   S/P total knee arthroplasty, left 10/26/2016   Hyperlipidemia 09/04/2014  Thrombocytopenia (HCC)    Precordial chest pain 04/05/2014   Coronary atherosclerosis of native coronary artery 10/01/2013   Other and unspecified hyperlipidemia 10/01/2013   Hypertension 10/01/2013   Type 2 diabetes mellitus with complication, without long-term current use of insulin  (HCC) 10/01/2013   Esophageal reflux 10/01/2013   Hypertrophy of prostate without urinary obstruction and other lower urinary tract symptoms (LUTS) 10/01/2013   Past Medical History:  Diagnosis Date   Allergic rhinitis    Anemia    Aortic stenosis    Arthritis    Basal cell carcinoma 11/01/2019    bcc left chest treatment TX cx3 [id number]    CHF (congestive heart failure) (HCC)    Chronic leg pain    right   Chronic lower back pain    Coronary artery disease    a. Stenting to RCA 2004; staged DES to LAD and Cx 2004. DES to [facility] 2012. b. DES to mCx, PTCA to dCx 11/2011. c. Lateral wall MI 2013 s/p PTCA to distal Cx & DES to mid OM2 11/2011. d. Low risk nuc 04/2014, EF wnl.   COVID-19     Diabetes mellitus    Insulin  dependent   Diabetic neuropathy (HCC)    MILD   Diverticulosis    Dysrhythmia    Bertrum syndrome    Gout    right wrist; right foot; right elbow; have had it since 1970's   H/O hiatal hernia    Heart murmur    History of echocardiogram    aortic sclerosis per echo 12/09 EF 65%, otherwise normal   History of hemorrhoids    BLEEDING   History of kidney stones    h/o   Hypertension    Diagnosed 1995    Myocardial infarction [facility])    Pancreatic pseudocyst    a. s/p remote drainage 2006.   Thrombocytopenia (HCC)    Seen on oldest labs in system from 2004   Vitamin B 12 deficiency    orally replaced    Family History  Problem Relation Age of Onset   Diabetes Mother    Hyperlipidemia Mother    Hypertension Mother    Cancer Father    Hypertension Father    Diabetes Sister    Hypertension Sister    Cancer Brother    Heart attack Neg Hx     Past Surgical History:  Procedure Laterality Date   ABDOMINAL AORTOGRAM W/LOWER EXTREMITY Bilateral 08/08/2020   Procedure: ABDOMINAL AORTOGRAM W/LOWER EXTREMITY;  Surgeon: Eliza Lonni RAMAN, MD;  Location: [facility] INVASIVE CV LAB;  Service: Cardiovascular;  Laterality: Bilateral;   AMPUTATION Right 12/12/2020   Procedure: RIGHT BELOW KNEE AMPUTATION;  Surgeon: Harden Jerona GAILS, MD;  Location: [facility] OR;  Service: Orthopedics;  Laterality: Right;   BACK SURGERY     total of 3 times S/P fall    CARPAL TUNNEL RELEASE Bilateral    CHOLECYSTECTOMY  1990's   CIRCUMCISION N/A 10/12/2023   Procedure: Dorsal slit;  Surgeon: [name], MD;  Location: [facility] ORS;  Service: Urology;  Laterality: N/A;   COLONOSCOPY     COLONOSCOPY Left 07/08/2023   Procedure: COLONOSCOPY;  Surgeon: San Sandor GAILS, DO;  Location: [facility] ENDOSCOPY;  Service: Gastroenterology;  Laterality: Left;   CORONARY ANGIOPLASTY  11/11/11   CORONARY ANGIOPLASTY WITH STENT PLACEMENT  09/30/2011   1 then; makes a total of 4   CORONARY ANGIOPLASTY WITH  STENT PLACEMENT  11/11/11   1; makes a total of 5   ESOPHAGOGASTRODUODENOSCOPY Left 07/08/2023  Procedure: ESOPHAGOGASTRODUODENOSCOPY (EGD);  Surgeon: San Sandor GAILS, DO;  Location: [facility] ENDOSCOPY;  Service: Gastroenterology;  Laterality: Left;   HEMOSTASIS CLIP PLACEMENT  07/08/2023   Procedure: HEMOSTASIS CLIP PLACEMENT;  Surgeon: San Sandor GAILS, DO;  Location: [facility] ENDOSCOPY;  Service: Gastroenterology;;   HOT HEMOSTASIS N/A 07/08/2023   Procedure: HOT HEMOSTASIS (ARGON PLASMA COAGULATION/BICAP);  Surgeon: San Sandor GAILS, DO;  Location: [facility] ENDOSCOPY;  Service: Gastroenterology;  Laterality: N/A;   INGUINAL HERNIA REPAIR  2003   right   JOINT REPLACEMENT Right 04/03/2002   hip replacment   KNEE ARTHROSCOPY  1990's   left   LEFT HEART CATH AND CORONARY ANGIOGRAPHY N/A 04/25/2023   Procedure: LEFT HEART CATH AND CORONARY ANGIOGRAPHY;  Surgeon: Anner Alm ORN, MD;  Location: [facility] INVASIVE CV LAB;  Service: Cardiovascular;  Laterality: N/A;   LEFT HEART CATHETERIZATION WITH CORONARY ANGIOGRAM N/A 09/30/2011   Procedure: LEFT HEART CATHETERIZATION WITH CORONARY ANGIOGRAM;  Surgeon: Candyce GORMAN Reek, MD;  Location: [facility] CATH LAB;  Service: Cardiovascular;  Laterality: N/A;  possible PCI   LEFT HEART CATHETERIZATION WITH CORONARY ANGIOGRAM N/A 11/15/2011   Procedure: LEFT HEART CATHETERIZATION WITH CORONARY ANGIOGRAM;  Surgeon: Candyce GORMAN Reek, MD;  Location: [facility] CATH LAB;  Service: Cardiovascular;  Laterality: N/A;   PERCUTANEOUS CORONARY STENT INTERVENTION (PCI-S)  09/30/2011   Procedure: PERCUTANEOUS CORONARY STENT INTERVENTION (PCI-S);  Surgeon: Candyce GORMAN Reek, MD;  Location: [facility] CATH LAB;  Service: Cardiovascular;;   PERCUTANEOUS CORONARY STENT INTERVENTION (PCI-S) N/A 11/11/2011   Procedure: PERCUTANEOUS CORONARY STENT INTERVENTION (PCI-S);  Surgeon: Candyce GORMAN Reek, MD;  Location: [facility] CATH LAB;  Service: Cardiovascular;  Laterality: N/A;   PERIPHERAL VASCULAR BALLOON ANGIOPLASTY  Right 08/08/2020   Procedure: PERIPHERAL VASCULAR BALLOON ANGIOPLASTY;  Surgeon: Eliza Lonni GORMAN, MD;  Location: [facility] INVASIVE CV LAB;  Service: Cardiovascular;  Laterality: Right;  Posterior tibial    SHOULDER SURGERY Right    X 2   STUMP REVISION Right 01/09/2021   Procedure: REVISION RIGHT BELOW KNEE AMPUTATION;  Surgeon: Harden Jerona GAILS, MD;  Location: [facility] OR;  Service: Orthopedics;  Laterality: Right;   TONSILLECTOMY  ~ 1948   TOTAL HIP REVISION Right 04/13/2019   Procedure: RIGHT TOTAL HIP REVISION-POSTERIOR  APPROACH LATERAL;  Surgeon: Barbarann Oneil BROCKS, MD;  Location: [facility] OR;  Service: Orthopedics;  Laterality: Right;   TOTAL KNEE ARTHROPLASTY Left 07/23/2016   Procedure: LEFT TOTAL KNEE ARTHROPLASTY;  Surgeon: Oneil BROCKS Barbarann, MD;  Location: [facility] OR;  Service: Orthopedics;  Laterality: Left;   Social History   Occupational History   Occupation: Retired  Tobacco Use   Smoking status: Former    Types: Cigarettes   Smokeless tobacco: Current    Types: Chew   Tobacco comments:    quit 60 years ago  Vaping Use   Vaping status: Never Used  Substance and Sexual Activity   Alcohol use: No   Drug use: No   Sexual activity: Not Currently

## 2023-10-21 ENCOUNTER — Ambulatory Visit: Payer: Medicare Other | Admitting: Internal Medicine

## 2023-10-28 ENCOUNTER — Ambulatory Visit: Payer: Medicare Other | Admitting: Physician Assistant

## 2023-11-01 DIAGNOSIS — E113393 Type 2 diabetes mellitus with moderate nonproliferative diabetic retinopathy without macular edema, bilateral: Secondary | ICD-10-CM | POA: Diagnosis not present

## 2023-11-01 DIAGNOSIS — H353221 Exudative age-related macular degeneration, left eye, with active choroidal neovascularization: Secondary | ICD-10-CM | POA: Diagnosis not present

## 2023-11-01 DIAGNOSIS — H353133 Nonexudative age-related macular degeneration, bilateral, advanced atrophic without subfoveal involvement: Secondary | ICD-10-CM | POA: Diagnosis not present

## 2023-11-01 DIAGNOSIS — H353211 Exudative age-related macular degeneration, right eye, with active choroidal neovascularization: Secondary | ICD-10-CM | POA: Diagnosis not present

## 2023-11-02 ENCOUNTER — Encounter: Payer: Self-pay | Admitting: Family

## 2023-11-02 ENCOUNTER — Other Ambulatory Visit: Payer: Self-pay

## 2023-11-02 ENCOUNTER — Ambulatory Visit (INDEPENDENT_AMBULATORY_CARE_PROVIDER_SITE_OTHER): Payer: Medicare Other | Admitting: Family

## 2023-11-02 DIAGNOSIS — Z89511 Acquired absence of right leg below knee: Secondary | ICD-10-CM | POA: Diagnosis not present

## 2023-11-02 DIAGNOSIS — L97911 Non-pressure chronic ulcer of unspecified part of right lower leg limited to breakdown of skin: Secondary | ICD-10-CM

## 2023-11-02 MED ORDER — DOXYCYCLINE HYCLATE 100 MG PO TABS: 100.0000 mg | ORAL_TABLET | Freq: Two times a day (BID) | ORAL | 0 refills | Status: DC

## 2023-11-02 NOTE — Progress Notes (Signed)
Office Visit Note   Patient: Keith Espinoza           Date of Birth: November 04, 1940           MRN: [id number] Visit Date: 11/02/2023              Requested by: Emilio Aspen, MD [address] [place],  [place] [address] PCP: Emilio Aspen, MD  Chief Complaint  Patient presents with   Right Leg - Follow-up      HPI: The patient is an 83 year old gentleman who is seen in follow-up for ulcers to his right transtibial amputation from well-fitting prosthesis  States that 1 week ago he was casted for a new socket.  When he does wear his old ilL fitting socket he has great pain and rubbing on the 2 ulcerative areas  States he discussed a liner liner with Dr. Lajoyce Corners but has not yet obtained 1  Assessment & Plan: Visit Diagnoses:  1. Right below-knee amputee (HCC)   2. Ulcer of right leg, limited to breakdown of skin (HCC)     Plan: Encouraged him to use the DuoDERM when he is wearing his prosthesis and wear the shrinker with direct skin contact the remaining hours of the day Placed on a course of doxycycline radiographs reassuring   Follow-Up Instructions: No follow-ups on file.   Ortho Exam  Patient is alert, oriented, no adenopathy, well-dressed, normal affect, normal respiratory effort. On examination right residual limb over the tibial tubercle and fibular head are ulcerations which are 1 cm in diameter with surrounding maceration and mild erythema.  These are superficial there is full fibrinous exudative tissue in the wound bed no exposed bone or tendon no sign of infection  Imaging: No results found. No images are attached to the encounter.  Labs: Lab Results  Component Value Date   HGBA1C 9.0 (H) 09/29/2023   HGBA1C 7.1 (A) 04/01/2023   HGBA1C 7.2 (A) 11/11/2022   REPTSTATUS 07/08/2023 FINAL 07/03/2023   GRAMSTAIN  04/13/2019    RARE WBC PRESENT, PREDOMINANTLY PMN NO ORGANISMS SEEN    CULT  07/03/2023    NO GROWTH 5  DAYS Performed at [facility], [address], [place], [place] [address]      Lab Results  Component Value Date   ALBUMIN 2.6 (L) 07/04/2023   ALBUMIN 2.3 (L) 07/03/2023   ALBUMIN 3.4 (L) 04/24/2023    Lab Results  Component Value Date   MG 2.0 07/08/2023   MG 1.9 07/07/2023   MG 1.8 07/06/2023   No results found for: "VD25OH"  No results found for: "PREALBUMIN"    Latest Ref Rng & Units 09/29/2023   10:18 AM 07/10/2023    2:27 AM 07/09/2023    2:25 AM  CBC EXTENDED  WBC 4.0 - 10.5 K/uL 4.5  6.4  8.6   RBC 4.22 - 5.81 MIL/uL 3.29  3.32  3.50   Hemoglobin 13.0 - 17.0 g/dL 82.9  9.6  9.7   HCT 56.2 - 52.0 % 32.2  30.5  31.4   Platelets 150 - 400 K/uL 104  110  141   NEUT# 1.7 - 7.7 K/uL   6.2   Lymph# 0.7 - 4.0 K/uL   1.3      There is no height or weight on file to calculate BMI.  Orders:  Orders Placed This Encounter  Procedures   XR Tibia/Fibula Right   No orders of the defined types were  placed in this encounter.    Procedures: No procedures performed  Clinical Data: No additional findings.  ROS:  All other systems negative, except as noted in the HPI. Review of Systems  Objective: Vital Signs: There were no vitals taken for this visit.  Specialty Comments:  No specialty comments available.  PMFS History: Patient Active Problem List   Diagnosis Date Noted   Acute on chronic combined systolic and diastolic HF (heart failure) (HCC) 07/09/2023   Gastric AVM 07/08/2023   Diverticulosis of colon without hemorrhage 07/08/2023   Gastrointestinal hemorrhage associated with acute gastritis 07/08/2023   PSVT (paroxysmal supraventricular tachycardia) (HCC) 07/08/2023   Paroxysmal A-fib (HCC) 07/08/2023   Grade I diastolic dysfunction 07/08/2023   Acute on chronic anemia 07/07/2023   New onset atrial fibrillation (HCC) 07/07/2023   Ischemic cardiomyopathy 07/07/2023   Acute systolic CHF (congestive heart failure) (HCC) 07/07/2023   Pressure  injury of skin 07/07/2023   Blister 07/06/2023   Edema of both lower extremities 07/05/2023   CHF exacerbation (HCC) 07/05/2023   Iron deficiency anemia 07/05/2023   Shortness of breath 07/03/2023   Finger numbness 05/05/2023   Elevated troponin 04/25/2023   Chest pain of uncertain etiology 04/23/2023   Unstable angina (HCC) 04/23/2023   AKI (acute kidney injury) (HCC) 04/23/2023   History of CAD (coronary artery disease) 04/23/2023   Contusion of right hip 03/08/2023   Type 2 diabetes mellitus with hyperglycemia, with long-term current use of insulin (HCC) 02/15/2022   Adenoma of left adrenal gland 02/15/2022   History of right below knee amputation (HCC) 10/06/2021   Exudative age-related macular degeneration of left eye with active choroidal neovascularization (HCC) 07/28/2021   Exudative age-related macular degeneration of right eye with active choroidal neovascularization (HCC) 06/01/2021   Intermediate stage nonexudative age-related macular degeneration of both eyes 06/01/2021   Type 2 diabetes mellitus with diabetic polyneuropathy, with long-term current use of insulin (HCC) 03/24/2021   Constipation 02/02/2021   Fecal impaction (HCC) 02/01/2021   Wound dehiscence 01/09/2021   Dehiscence of amputation stump (HCC)    Status post percutaneous transluminal coronary angioplasty 01/06/2021   Type II diabetes mellitus, uncontrolled 01/06/2021   Acute pancreatitis 01/06/2021   Diabetic peripheral vascular disease (HCC) 01/06/2021   Encounter for screening for other disorder 01/06/2021   Enlarged prostate 01/06/2021   Foot ulcer, right (HCC) 01/06/2021   Gout 01/06/2021   Headache 01/06/2021   Neck pain 01/06/2021   Hypoglycemia 01/06/2021   Loss of appetite 01/06/2021   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   Peripheral neuropathy 01/06/2021   Sciatica 01/06/2021   Vitamin B12 deficiency 01/06/2021   Vitamin D deficiency 01/06/2021   Weakness 01/06/2021   Diabetes  mellitus type 2 with neurological manifestations (HCC) 01/06/2021   Protein-calorie malnutrition, severe 12/26/2020   Acute blood loss anemia 12/26/2020   Prerenal azotemia 12/26/2020   Below-knee amputation of right lower extremity (HCC) 12/12/2020   Gangrene of right foot (HCC)    Diabetic neuropathy (HCC) 11/17/2020   Hyperglycemia due to type 2 diabetes mellitus (HCC) 11/17/2020   Long term (current) use of insulin (HCC) 11/17/2020   Obesity 11/17/2020   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/2019   Hip dislocation, right (HCC) 04/13/2019   Other intervertebral disc degeneration, lumbar region 03/30/2019   Coronary artery disease 01/30/2019   Tobacco abuse 01/30/2019   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   Burn, foot, second degree, left, initial encounter 06/08/2017   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 03/16/2017  S/P total knee arthroplasty, left 10/26/2016   Hyperlipidemia 09/04/2014   Thrombocytopenia (HCC)    Precordial chest pain 04/05/2014   Coronary atherosclerosis of native coronary artery 10/01/2013   Other and unspecified hyperlipidemia 10/01/2013   Hypertension 10/01/2013   Type 2 diabetes mellitus with complication, without long-term current use of insulin (HCC) 10/01/2013   Esophageal reflux 10/01/2013   Hypertrophy of prostate without urinary obstruction and other lower urinary tract symptoms (LUTS) 10/01/2013   Past Medical History:  Diagnosis Date   Allergic rhinitis    Anemia    Aortic stenosis    Arthritis    Basal cell carcinoma 11/01/2019    bcc left chest treatment TX cx3 80fu    CHF (congestive heart failure) (HCC)    Chronic leg pain    right   Chronic lower back pain    Coronary artery disease    a. Stenting to RCA 2004; staged DES to LAD and Cx 2004. DES to [facility] 2012. b. DES to mCx, PTCA to dCx 11/2011. c. Lateral wall MI 2013 s/p PTCA to distal Cx & DES to mid OM2 11/2011. d. Low risk nuc 04/2014, EF wnl.   COVID-19     Diabetes mellitus    Insulin dependent   Diabetic neuropathy (HCC)    MILD   Diverticulosis    Dysrhythmia    [name] syndrome    Gout    right wrist; right foot; right elbow; have had it since 1970's   H/O hiatal hernia    Heart murmur    History of echocardiogram    aortic sclerosis per echo 12/09 EF 65%, otherwise normal   History of hemorrhoids    BLEEDING   History of kidney stones    h/o   Hypertension    Diagnosed 1995    Myocardial infarction [facility])    Pancreatic pseudocyst    a. s/p remote drainage 2006.   Thrombocytopenia (HCC)    Seen on oldest labs in system from 2004   Vitamin B 12 deficiency    orally replaced    Family History  Problem Relation Age of Onset   Diabetes Mother    Hyperlipidemia Mother    Hypertension Mother    Cancer Father    Hypertension Father    Diabetes Sister    Hypertension Sister    Cancer Brother    Heart attack Neg Hx     Past Surgical History:  Procedure Laterality Date   ABDOMINAL AORTOGRAM W/LOWER EXTREMITY Bilateral 08/08/2020   Procedure: ABDOMINAL AORTOGRAM W/LOWER EXTREMITY;  Surgeon: Chuck Hint, MD;  Location: [facility] INVASIVE CV LAB;  Service: Cardiovascular;  Laterality: Bilateral;   AMPUTATION Right 12/12/2020   Procedure: RIGHT BELOW KNEE AMPUTATION;  Surgeon: Nadara Mustard, MD;  Location: [facility] OR;  Service: Orthopedics;  Laterality: Right;   BACK SURGERY     "total of 3 times" S/P fall    CARPAL TUNNEL RELEASE Bilateral    CHOLECYSTECTOMY  1990's   CIRCUMCISION N/A 10/12/2023   Procedure: Dorsal slit;  Surgeon: Despina Arias, MD;  Location: [facility] ORS;  Service: Urology;  Laterality: N/A;   COLONOSCOPY     COLONOSCOPY Left 07/08/2023   Procedure: COLONOSCOPY;  Surgeon: Shellia Cleverly, DO;  Location: [facility] ENDOSCOPY;  Service: Gastroenterology;  Laterality: Left;   CORONARY ANGIOPLASTY  11/11/11   CORONARY ANGIOPLASTY WITH STENT PLACEMENT  09/30/2011   "1 then; makes a total of 4"   CORONARY ANGIOPLASTY WITH  STENT PLACEMENT  11/11/11   "  1; makes a total of 5"   ESOPHAGOGASTRODUODENOSCOPY Left 07/08/2023   Procedure: ESOPHAGOGASTRODUODENOSCOPY (EGD);  Surgeon: Shellia Cleverly, DO;  Location: [facility] ENDOSCOPY;  Service: Gastroenterology;  Laterality: Left;   HEMOSTASIS CLIP PLACEMENT  07/08/2023   Procedure: HEMOSTASIS CLIP PLACEMENT;  Surgeon: Shellia Cleverly, DO;  Location: [facility] ENDOSCOPY;  Service: Gastroenterology;;   HOT HEMOSTASIS N/A 07/08/2023   Procedure: HOT HEMOSTASIS (ARGON PLASMA COAGULATION/BICAP);  Surgeon: Shellia Cleverly, DO;  Location: [facility] ENDOSCOPY;  Service: Gastroenterology;  Laterality: N/A;   INGUINAL HERNIA REPAIR  2003   right   JOINT REPLACEMENT Right 04/03/2002   hip replacment   KNEE ARTHROSCOPY  1990's   left   LEFT HEART CATH AND CORONARY ANGIOGRAPHY N/A 04/25/2023   Procedure: LEFT HEART CATH AND CORONARY ANGIOGRAPHY;  Surgeon: Marykay Lex, MD;  Location: [facility] INVASIVE CV LAB;  Service: Cardiovascular;  Laterality: N/A;   LEFT HEART CATHETERIZATION WITH CORONARY ANGIOGRAM N/A 09/30/2011   Procedure: LEFT HEART CATHETERIZATION WITH CORONARY ANGIOGRAM;  Surgeon: Corky Crafts, MD;  Location: [facility] CATH LAB;  Service: Cardiovascular;  Laterality: N/A;  possible PCI   LEFT HEART CATHETERIZATION WITH CORONARY ANGIOGRAM N/A 11/15/2011   Procedure: LEFT HEART CATHETERIZATION WITH CORONARY ANGIOGRAM;  Surgeon: Corky Crafts, MD;  Location: [facility] CATH LAB;  Service: Cardiovascular;  Laterality: N/A;   PERCUTANEOUS CORONARY STENT INTERVENTION (PCI-S)  09/30/2011   Procedure: PERCUTANEOUS CORONARY STENT INTERVENTION (PCI-S);  Surgeon: Corky Crafts, MD;  Location: [facility] CATH LAB;  Service: Cardiovascular;;   PERCUTANEOUS CORONARY STENT INTERVENTION (PCI-S) N/A 11/11/2011   Procedure: PERCUTANEOUS CORONARY STENT INTERVENTION (PCI-S);  Surgeon: Corky Crafts, MD;  Location: [facility] CATH LAB;  Service: Cardiovascular;  Laterality: N/A;   PERIPHERAL VASCULAR BALLOON ANGIOPLASTY  Right 08/08/2020   Procedure: PERIPHERAL VASCULAR BALLOON ANGIOPLASTY;  Surgeon: Chuck Hint, MD;  Location: [facility] INVASIVE CV LAB;  Service: Cardiovascular;  Laterality: Right;  Posterior tibial    SHOULDER SURGERY Right    X 2   STUMP REVISION Right 01/09/2021   Procedure: REVISION RIGHT BELOW KNEE AMPUTATION;  Surgeon: Nadara Mustard, MD;  Location: [facility] OR;  Service: Orthopedics;  Laterality: Right;   TONSILLECTOMY  ~ 1948   TOTAL HIP REVISION Right 04/13/2019   Procedure: RIGHT TOTAL HIP REVISION-POSTERIOR  APPROACH LATERAL;  Surgeon: Eldred Manges, MD;  Location: [facility] OR;  Service: Orthopedics;  Laterality: Right;   TOTAL KNEE ARTHROPLASTY Left 07/23/2016   Procedure: LEFT TOTAL KNEE ARTHROPLASTY;  Surgeon: Eldred Manges, MD;  Location: [facility] OR;  Service: Orthopedics;  Laterality: Left;   Social History   Occupational History   Occupation: Retired  Tobacco Use   Smoking status: Former    Types: Cigarettes   Smokeless tobacco: Current    Types: Chew   Tobacco comments:    quit 60 years ago  Vaping Use   Vaping status: Never Used  Substance and Sexual Activity   Alcohol use: No   Drug use: No   Sexual activity: Not Currently

## 2023-11-16 ENCOUNTER — Other Ambulatory Visit: Payer: Self-pay | Admitting: Physician Assistant

## 2023-11-16 NOTE — Telephone Encounter (Signed)
Pt's pharmacy is requesting a refill on empagliflozin (Jardiance) 25 mg tablets. This medication was prescribe by another provider and we normally do not refill this strength. Would Jari Favre, PA like to refill this medication? Please address

## 2023-11-21 ENCOUNTER — Other Ambulatory Visit: Payer: Self-pay

## 2023-11-21 MED ORDER — EMPAGLIFLOZIN 25 MG PO TABS: ORAL_TABLET | ORAL | 0 refills | Status: DC

## 2023-11-25 DIAGNOSIS — N471 Phimosis: Secondary | ICD-10-CM | POA: Diagnosis not present

## 2023-11-25 DIAGNOSIS — R8279 Other abnormal findings on microbiological examination of urine: Secondary | ICD-10-CM | POA: Diagnosis not present

## 2023-12-05 ENCOUNTER — Other Ambulatory Visit: Payer: Self-pay

## 2023-12-05 MED ORDER — DEXCOM G7 SENSOR MISC: 1.0000 | 3 refills | Status: DC

## 2023-12-06 DIAGNOSIS — H353221 Exudative age-related macular degeneration, left eye, with active choroidal neovascularization: Secondary | ICD-10-CM | POA: Diagnosis not present

## 2023-12-06 DIAGNOSIS — E113393 Type 2 diabetes mellitus with moderate nonproliferative diabetic retinopathy without macular edema, bilateral: Secondary | ICD-10-CM | POA: Diagnosis not present

## 2023-12-06 DIAGNOSIS — H353211 Exudative age-related macular degeneration, right eye, with active choroidal neovascularization: Secondary | ICD-10-CM | POA: Diagnosis not present

## 2023-12-06 DIAGNOSIS — H353133 Nonexudative age-related macular degeneration, bilateral, advanced atrophic without subfoveal involvement: Secondary | ICD-10-CM | POA: Diagnosis not present

## 2023-12-09 ENCOUNTER — Ambulatory Visit: Payer: Medicare Other | Admitting: Physician Assistant

## 2023-12-12 DIAGNOSIS — I5043 Acute on chronic combined systolic (congestive) and diastolic (congestive) heart failure: Secondary | ICD-10-CM | POA: Diagnosis not present

## 2023-12-12 DIAGNOSIS — I480 Paroxysmal atrial fibrillation: Secondary | ICD-10-CM | POA: Diagnosis not present

## 2023-12-12 DIAGNOSIS — E1342 Other specified diabetes mellitus with diabetic polyneuropathy: Secondary | ICD-10-CM | POA: Diagnosis not present

## 2023-12-12 DIAGNOSIS — N1831 Chronic kidney disease, stage 3a: Secondary | ICD-10-CM | POA: Diagnosis not present

## 2023-12-13 ENCOUNTER — Ambulatory Visit: Payer: Medicare Other | Admitting: Orthopedic Surgery

## 2023-12-13 ENCOUNTER — Ambulatory Visit: Payer: Medicare Other | Admitting: Physician Assistant

## 2023-12-13 ENCOUNTER — Encounter: Payer: Self-pay | Admitting: Family

## 2023-12-13 ENCOUNTER — Ambulatory Visit (INDEPENDENT_AMBULATORY_CARE_PROVIDER_SITE_OTHER): Admitting: Family

## 2023-12-13 DIAGNOSIS — T148XXA Other injury of unspecified body region, initial encounter: Secondary | ICD-10-CM

## 2023-12-13 DIAGNOSIS — Z89511 Acquired absence of right leg below knee: Secondary | ICD-10-CM | POA: Diagnosis not present

## 2023-12-13 NOTE — Progress Notes (Signed)
 Office Visit Note   Patient: Keith Espinoza           Date of Birth: [birth year]           MRN: [id number] Visit Date: 12/13/2023              Requested by: Emilio Aspen, MD [address] [place],  [place] [address] PCP: Emilio Aspen, MD  Chief Complaint  Patient presents with   Right Leg - Follow-up      HPI: The patient is an 83 year old gentleman who is seen in follow-up for ulcers to his right transtibial amputation.  He today is in a new socket he reports he has a subsequent fitting for any required modifications later this week.  No concerns voiced today  Assessment & Plan: Visit Diagnoses: No diagnosis found.  Plan: Follow-up in the office as needed.  Follow-Up Instructions: Return if symptoms worsen or fail to improve.   Ortho Exam  Patient is alert, oriented, no adenopathy, well-dressed, normal affect, normal respiratory effort. On examination right residual limb there are no longer any open areas to the right residual limb there is no edema erythema no impending skin breakdown  Imaging: No results found. No images are attached to the encounter.  Labs: Lab Results  Component Value Date   HGBA1C 9.0 (H) 09/29/2023   HGBA1C 7.1 (A) 04/01/2023   HGBA1C 7.2 (A) 11/11/2022   REPTSTATUS 07/08/2023 FINAL 07/03/2023   GRAMSTAIN  04/13/2019    RARE WBC PRESENT, PREDOMINANTLY PMN NO ORGANISMS SEEN    CULT  07/03/2023    NO GROWTH 5 DAYS Performed at [facility], [address], [place], [place] [address]      Lab Results  Component Value Date   ALBUMIN 2.6 (L) 07/04/2023   ALBUMIN 2.3 (L) 07/03/2023   ALBUMIN 3.4 (L) 04/24/2023    Lab Results  Component Value Date   MG 2.0 07/08/2023   MG 1.9 07/07/2023   MG 1.8 07/06/2023   No results found for: "VD25OH"  No results found for: "PREALBUMIN"    Latest Ref Rng & Units 09/29/2023   10:18 AM 07/10/2023    2:27 AM 07/09/2023    2:25 AM  CBC EXTENDED   WBC 4.0 - 10.5 K/uL 4.5  6.4  8.6   RBC 4.22 - 5.81 MIL/uL 3.29  3.32  3.50   Hemoglobin 13.0 - 17.0 g/dL 82.9  9.6  9.7   HCT 56.2 - 52.0 % 32.2  30.5  31.4   Platelets 150 - 400 K/uL 104  110  141   NEUT# 1.7 - 7.7 K/uL   6.2   Lymph# 0.7 - 4.0 K/uL   1.3      There is no height or weight on file to calculate BMI.  Orders:  No orders of the defined types were placed in this encounter.  No orders of the defined types were placed in this encounter.    Procedures: No procedures performed  Clinical Data: No additional findings.  ROS:  All other systems negative, except as noted in the HPI. Review of Systems  Objective: Vital Signs: There were no vitals taken for this visit.  Specialty Comments:  No specialty comments available.  PMFS History: Patient Active Problem List   Diagnosis Date Noted   Acute on chronic combined systolic and diastolic HF (heart failure) (HCC) 07/09/2023   Gastric AVM 07/08/2023   Diverticulosis of colon without hemorrhage 07/08/2023  Gastrointestinal hemorrhage associated with acute gastritis 07/08/2023   PSVT (paroxysmal supraventricular tachycardia) (HCC) 07/08/2023   Paroxysmal A-fib (HCC) 07/08/2023   Grade I diastolic dysfunction 07/08/2023   Acute on chronic anemia 07/07/2023   New onset atrial fibrillation (HCC) 07/07/2023   Ischemic cardiomyopathy 07/07/2023   Acute systolic CHF (congestive heart failure) (HCC) 07/07/2023   Pressure injury of skin 07/07/2023   Blister 07/06/2023   Edema of both lower extremities 07/05/2023   CHF exacerbation (HCC) 07/05/2023   Iron deficiency anemia 07/05/2023   Shortness of breath 07/03/2023   Finger numbness 05/05/2023   Elevated troponin 04/25/2023   Chest pain of uncertain etiology 04/23/2023   Unstable angina (HCC) 04/23/2023   AKI (acute kidney injury) (HCC) 04/23/2023   History of CAD (coronary artery disease) 04/23/2023   Contusion of right hip 03/08/2023   Type 2 diabetes  mellitus with hyperglycemia, with long-term current use of insulin (HCC) 02/15/2022   Adenoma of left adrenal gland 02/15/2022   History of right below knee amputation (HCC) 10/06/2021   Exudative age-related macular degeneration of left eye with active choroidal neovascularization (HCC) 07/28/2021   Exudative age-related macular degeneration of right eye with active choroidal neovascularization (HCC) 06/01/2021   Intermediate stage nonexudative age-related macular degeneration of both eyes 06/01/2021   Type 2 diabetes mellitus with diabetic polyneuropathy, with long-term current use of insulin (HCC) 03/24/2021   Constipation 02/02/2021   Fecal impaction (HCC) 02/01/2021   Wound dehiscence 01/09/2021   Dehiscence of amputation stump (HCC)    Status post percutaneous transluminal coronary angioplasty 01/06/2021   Type II diabetes mellitus, uncontrolled 01/06/2021   Acute pancreatitis 01/06/2021   Diabetic peripheral vascular disease (HCC) 01/06/2021   Encounter for screening for other disorder 01/06/2021   Enlarged prostate 01/06/2021   Foot ulcer, right (HCC) 01/06/2021   Gout 01/06/2021   Headache 01/06/2021   Neck pain 01/06/2021   Hypoglycemia 01/06/2021   Loss of appetite 01/06/2021   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   Peripheral neuropathy 01/06/2021   Sciatica 01/06/2021   Vitamin B12 deficiency 01/06/2021   Vitamin D deficiency 01/06/2021   Weakness 01/06/2021   Diabetes mellitus type 2 with neurological manifestations (HCC) 01/06/2021   Protein-calorie malnutrition, severe 12/26/2020   Acute blood loss anemia 12/26/2020   Prerenal azotemia 12/26/2020   Below-knee amputation of right lower extremity (HCC) 12/12/2020   Gangrene of right foot (HCC)    Diabetic neuropathy (HCC) 11/17/2020   Hyperglycemia due to type 2 diabetes mellitus (HCC) 11/17/2020   Long term (current) use of insulin (HCC) 11/17/2020   Obesity 11/17/2020   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/2019    Hip dislocation, right (HCC) 04/13/2019   Other intervertebral disc degeneration, lumbar region 03/30/2019   Coronary artery disease 01/30/2019   Tobacco abuse 01/30/2019   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   Burn, foot, second degree, left, initial encounter 06/08/2017   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 03/16/2017   S/P total knee arthroplasty, left 10/26/2016   Hyperlipidemia 09/04/2014   Thrombocytopenia (HCC)    Precordial chest pain 04/05/2014   Coronary atherosclerosis of native coronary artery 10/01/2013   Other and unspecified hyperlipidemia 10/01/2013   Hypertension 10/01/2013   Type 2 diabetes mellitus with complication, without long-term current use of insulin (HCC) 10/01/2013   Esophageal reflux 10/01/2013   Hypertrophy of prostate without urinary obstruction and other lower urinary tract symptoms (LUTS) 10/01/2013   Past Medical History:  Diagnosis Date   Allergic rhinitis  Anemia    Aortic stenosis    Arthritis    Basal cell carcinoma 11/01/2019    bcc left chest treatment TX cx3 35fu    CHF (congestive heart failure) (HCC)    Chronic leg pain    right   Chronic lower back pain    Coronary artery disease    a. Stenting to RCA 2004; staged DES to LAD and Cx 2004. DES to [facility] 2012. b. DES to mCx, PTCA to dCx 11/2011. c. Lateral wall MI 2013 s/p PTCA to distal Cx & DES to mid OM2 11/2011. d. Low risk nuc 04/2014, EF wnl.   COVID-19    Diabetes mellitus    Insulin dependent   Diabetic neuropathy (HCC)    MILD   Diverticulosis    Dysrhythmia    [name] syndrome    Gout    right wrist; right foot; right elbow; have had it since 1970's   H/O hiatal hernia    Heart murmur    History of echocardiogram    aortic sclerosis per echo 12/09 EF 65%, otherwise normal   History of hemorrhoids    BLEEDING   History of kidney stones    h/o   Hypertension    Diagnosed 1995    Myocardial infarction [facility])    Pancreatic pseudocyst    a. s/p  remote drainage 2006.   Thrombocytopenia (HCC)    Seen on oldest labs in system from 2004   Vitamin B 12 deficiency    orally replaced    Family History  Problem Relation Age of Onset   Diabetes Mother    Hyperlipidemia Mother    Hypertension Mother    Cancer Father    Hypertension Father    Diabetes Sister    Hypertension Sister    Cancer Brother    Heart attack Neg Hx     Past Surgical History:  Procedure Laterality Date   ABDOMINAL AORTOGRAM W/LOWER EXTREMITY Bilateral 08/08/2020   Procedure: ABDOMINAL AORTOGRAM W/LOWER EXTREMITY;  Surgeon: Chuck Hint, MD;  Location: [facility] INVASIVE CV LAB;  Service: Cardiovascular;  Laterality: Bilateral;   AMPUTATION Right 12/12/2020   Procedure: RIGHT BELOW KNEE AMPUTATION;  Surgeon: Nadara Mustard, MD;  Location: [facility] OR;  Service: Orthopedics;  Laterality: Right;   BACK SURGERY     "total of 3 times" S/P fall    CARPAL TUNNEL RELEASE Bilateral    CHOLECYSTECTOMY  1990's   CIRCUMCISION N/A 10/12/2023   Procedure: Dorsal slit;  Surgeon: Despina Arias, MD;  Location: [facility] ORS;  Service: Urology;  Laterality: N/A;   COLONOSCOPY     COLONOSCOPY Left 07/08/2023   Procedure: COLONOSCOPY;  Surgeon: Shellia Cleverly, DO;  Location: [facility] ENDOSCOPY;  Service: Gastroenterology;  Laterality: Left;   CORONARY ANGIOPLASTY  11/11/11   CORONARY ANGIOPLASTY WITH STENT PLACEMENT  09/30/2011   "1 then; makes a total of 4"   CORONARY ANGIOPLASTY WITH STENT PLACEMENT  11/11/11   "1; makes a total of 5"   ESOPHAGOGASTRODUODENOSCOPY Left 07/08/2023   Procedure: ESOPHAGOGASTRODUODENOSCOPY (EGD);  Surgeon: Shellia Cleverly, DO;  Location: [facility] ENDOSCOPY;  Service: Gastroenterology;  Laterality: Left;   HEMOSTASIS CLIP PLACEMENT  07/08/2023   Procedure: HEMOSTASIS CLIP PLACEMENT;  Surgeon: Shellia Cleverly, DO;  Location: [facility] ENDOSCOPY;  Service: Gastroenterology;;   HOT HEMOSTASIS N/A 07/08/2023   Procedure: HOT HEMOSTASIS (ARGON PLASMA COAGULATION/BICAP);   Surgeon: Shellia Cleverly, DO;  Location: [facility] ENDOSCOPY;  Service: Gastroenterology;  Laterality: N/A;   INGUINAL HERNIA  REPAIR  2003   right   JOINT REPLACEMENT Right 04/03/2002   hip replacment   KNEE ARTHROSCOPY  1990's   left   LEFT HEART CATH AND CORONARY ANGIOGRAPHY N/A 04/25/2023   Procedure: LEFT HEART CATH AND CORONARY ANGIOGRAPHY;  Surgeon: Marykay Lex, MD;  Location: [facility] INVASIVE CV LAB;  Service: Cardiovascular;  Laterality: N/A;   LEFT HEART CATHETERIZATION WITH CORONARY ANGIOGRAM N/A 09/30/2011   Procedure: LEFT HEART CATHETERIZATION WITH CORONARY ANGIOGRAM;  Surgeon: Corky Crafts, MD;  Location: [facility] CATH LAB;  Service: Cardiovascular;  Laterality: N/A;  possible PCI   LEFT HEART CATHETERIZATION WITH CORONARY ANGIOGRAM N/A 11/15/2011   Procedure: LEFT HEART CATHETERIZATION WITH CORONARY ANGIOGRAM;  Surgeon: Corky Crafts, MD;  Location: [facility] CATH LAB;  Service: Cardiovascular;  Laterality: N/A;   PERCUTANEOUS CORONARY STENT INTERVENTION (PCI-S)  09/30/2011   Procedure: PERCUTANEOUS CORONARY STENT INTERVENTION (PCI-S);  Surgeon: Corky Crafts, MD;  Location: [facility] CATH LAB;  Service: Cardiovascular;;   PERCUTANEOUS CORONARY STENT INTERVENTION (PCI-S) N/A 11/11/2011   Procedure: PERCUTANEOUS CORONARY STENT INTERVENTION (PCI-S);  Surgeon: Corky Crafts, MD;  Location: [facility] CATH LAB;  Service: Cardiovascular;  Laterality: N/A;   PERIPHERAL VASCULAR BALLOON ANGIOPLASTY Right 08/08/2020   Procedure: PERIPHERAL VASCULAR BALLOON ANGIOPLASTY;  Surgeon: Chuck Hint, MD;  Location: [facility] INVASIVE CV LAB;  Service: Cardiovascular;  Laterality: Right;  Posterior tibial    SHOULDER SURGERY Right    X 2   STUMP REVISION Right 01/09/2021   Procedure: REVISION RIGHT BELOW KNEE AMPUTATION;  Surgeon: Nadara Mustard, MD;  Location: [facility] OR;  Service: Orthopedics;  Laterality: Right;   TONSILLECTOMY  ~ 1948   TOTAL HIP REVISION Right 04/13/2019   Procedure: RIGHT TOTAL HIP  REVISION-POSTERIOR  APPROACH LATERAL;  Surgeon: Eldred Manges, MD;  Location: [facility] OR;  Service: Orthopedics;  Laterality: Right;   TOTAL KNEE ARTHROPLASTY Left 07/23/2016   Procedure: LEFT TOTAL KNEE ARTHROPLASTY;  Surgeon: Eldred Manges, MD;  Location: [facility] OR;  Service: Orthopedics;  Laterality: Left;   Social History   Occupational History   Occupation: Retired  Tobacco Use   Smoking status: Former    Types: Cigarettes   Smokeless tobacco: Current    Types: Chew   Tobacco comments:    quit 60 years ago  Vaping Use   Vaping status: Never Used  Substance and Sexual Activity   Alcohol use: No   Drug use: No   Sexual activity: Not Currently

## 2023-12-18 NOTE — Progress Notes (Unsigned)
 Cardiology Office Note:  .   Date:  12/19/2023  ID:  Keith Espinoza, DOB [birth year], MRN [id number] PCP: Emilio Aspen, MD  [facility] Providers Cardiologist:  Lance Muss, MD { ``````````````````````````````` History of Present Illness: .   Keith Espinoza is a 83 y.o. male with a past medical history of CAD status post stenting of the RCA in 2004 with staged DES to LAD and circumflex 2004, DES to RCA 2012, DES to mid circumflex, PTCA to distal circumflex 2013 and low risk NST 04/2014 here for follow-up appointment.  Was advised to stop chewing tobacco.  Had COVID January 2022 with no hospital stay required.  History includes leg amputation back in March 2022 and unfortunately his wife passed with the following month April 2022.  He received a prosthetic leg and has been utilizing this.  Cardiac catheterization was performed in 2024 for unstable angina which is detailed below.  No stents were placed and he was diagnosed with severe multivessel CAD and was evaluated for surgery but turned down.  At his last visit with Dr. Eldridge Dace in July of this year.  His cardiac catheterization findings were discussed at length.  Unfortunately, no options for PCI.  His left ventricular function was normal.  Management was well-controlled.  The biggest trigger for chest discomfort is emotional stress.  Plan was to continue with medical therapy.  Both Dr. Eldridge Dace and the patient agreed that he is not a candidate for bypass surgery.  I saw the patient November 2024, he  presented with a history of coronary disease, diabetes, and congestive heart failure, has been managing well with no recent chest pain or shortness of breath. The patient's blood sugar levels have been well-controlled at home, with a recent reading of 123. The patient's cholesterol levels are also well-managed, with a recent LDL of 36 and triglycerides of 106.  The patient has been non-ambulatory for the past  three months due to a broken femur. The patient also has some swelling in the legs, which the patient's daughter has been managing by adjusting the patient's furosemide dosage as needed.Recent left amputation for dry gangrene by Dr. Lajoyce Corners.    Reports no shortness of breath nor dyspnea on exertion. Reports no chest pain, pressure, or tightness. No edema, orthopnea, PND. Reports no palpitations.   Today, he with a history of heart disease and diabetes, presents for a follow-up visit after a recent leg surgery. The patient reports that the surgery went smoothly and the pain was manageable. The patient has been using a prosthesis for the leg and reports significant improvement in mobility and reduction in pain since getting a new prosthesis. Prior to the new prosthesis, the patient was unable to walk to the car, but now he can walk around the grocery store. The patient also reports some weakness in the left leg, which has been doing most of the work due to the issues with the right leg. The patient has been doing exercises at home to build strength in the left leg.  The patient also reports no issues with the medications he is taking for his heart disease and diabetes. The patient's blood pressure has been consistently low, but he reports no symptoms such as lightheadedness or dizziness. The patient's blood sugar levels have been well controlled, and he has been checking them regularly. The patient denies any chest pains or shortness of breath.  Reports no shortness of breath nor dyspnea on exertion. Reports no chest pain, pressure, or tightness.  No edema, orthopnea, PND. Reports no palpitations.   Discussed the use of AI scribe software for clinical note transcription with the patient, who gave verbal consent to proceed.    ROS: Pertinent ROS in HPI  Studies Reviewed: .       Cardiac cath 04/25/23  Mid LAD-1 lesion is 75% stenosed with 70% stenosed side branch in 1st Diag. - just prior to stent.  Mid  LAD-2 STENT is 40% stenosed.  Mid LAD-3 lesion is 75% stenosed with 60% stenosed side branch in 2nd Diag at distal edge of STENT   1st Diag lesion is 90% stenosed.   Prox Cx to Mid Cx STENT segment is 45% stenosed with 90% stenosed side branch in 4th Mrg.  Mid Cx to Dist Cx lesion is 95% stenosed just after stent   Dist Cx-1 lesion is 95% stenosed prior to stent. Previously placed Dist Cx-2 DES STENT is widely patent.   Prox RCA-1 lesion is 70% stenosed.  Prox RCA-2 lesion is 85% stenosed. Prox RCA-3 lesion is 80% stenosed. Prox RCA to Mid RCA lesion is 95% stenosed.   Mid RCA STENT is 35% stenosed.   RPAV lesion is 60% stenosed.   RPDA lesion is 80% stenosed.   There is no aortic valve stenosis.    Severe MV CAD - with moderate ISR in LAD/RCA & LCx but each stent with stent edge lesions.  "   He was evaluated for surgery but turned down.  He is here to discuss further options.      Physical Exam:   VS:  BP (!) 128/50   Pulse 60   Ht 5\' 10"  (1.778 m)   Wt 166 lb (75.3 kg)   SpO2 98%   BMI 23.82 kg/m    Wt Readings from Last 3 Encounters:  12/19/23 166 lb (75.3 kg)  10/12/23 160 lb (72.6 kg)  09/29/23 165 lb (74.8 kg)    GEN: Well nourished, well developed in no acute distress NECK: No JVD; No carotid bruits CARDIAC: RRR, no murmurs, rubs, gallops RESPIRATORY:  Clear to auscultation without rales, wheezing or rhonchi  ABDOMEN: Soft, non-tender, non-distended EXTREMITIES:  No edema; No deformity   ASSESSMENT AND PLAN: .    Aortic stenosis Mild to moderate aortic stenosis with prominent murmur. Monitored annually. - Schedule echocardiogram in the fall. - Follow up with Doctor Anne Fu after echocardiogram.  Hypertension Blood pressure low, diastolic in high 96E to low 60s. No symptoms. Advised to report if diastolic falls into 40s. - Monitor blood pressure regularly. - Report if diastolic blood pressure consistently falls into the 40s.  Hyperlipidemia LDL at 36,  triglycerides at 106. Lipid levels well controlled.  Diabetes management Blood sugar levels well controlled with current management.  Prosthesis adjustment New prosthesis improves mobility, resolves pain. Performing home exercises. - Continue home exercises to build strength in the left leg. - Consider wearing compression hose for left leg fatigue and swelling.  Follow-up Establish care with Doctor [place]. Next visit to include echocardiogram. - Schedule follow-up appointment with Doctor Scanes in six months. - Ensure echocardiogram is done a few days before the appointment with Doctor Scanes.   Dispo: He can follow-up in 6 months with Dr. Anne Fu  Signed, Sharlene Dory, PA-C

## 2023-12-19 ENCOUNTER — Ambulatory Visit: Attending: Physician Assistant | Admitting: Physician Assistant

## 2023-12-19 ENCOUNTER — Encounter: Payer: Self-pay | Admitting: Physician Assistant

## 2023-12-19 VITALS — BP 128/50 | HR 60 | Ht 70.0 in | Wt 166.0 lb

## 2023-12-19 DIAGNOSIS — I10 Essential (primary) hypertension: Secondary | ICD-10-CM | POA: Diagnosis not present

## 2023-12-19 DIAGNOSIS — I350 Nonrheumatic aortic (valve) stenosis: Secondary | ICD-10-CM | POA: Insufficient documentation

## 2023-12-19 DIAGNOSIS — I2510 Atherosclerotic heart disease of native coronary artery without angina pectoris: Secondary | ICD-10-CM | POA: Diagnosis not present

## 2023-12-19 DIAGNOSIS — S88111D Complete traumatic amputation at level between knee and ankle, right lower leg, subsequent encounter: Secondary | ICD-10-CM | POA: Insufficient documentation

## 2023-12-19 DIAGNOSIS — E118 Type 2 diabetes mellitus with unspecified complications: Secondary | ICD-10-CM | POA: Insufficient documentation

## 2023-12-19 DIAGNOSIS — I5021 Acute systolic (congestive) heart failure: Secondary | ICD-10-CM | POA: Insufficient documentation

## 2023-12-19 DIAGNOSIS — R600 Localized edema: Secondary | ICD-10-CM | POA: Insufficient documentation

## 2023-12-19 DIAGNOSIS — E785 Hyperlipidemia, unspecified: Secondary | ICD-10-CM | POA: Diagnosis not present

## 2023-12-19 NOTE — Patient Instructions (Addendum)
 Medication Instructions:  Your physician recommends that you continue on your current medications as directed. Please refer to the Current Medication list given to you today.  *If you need a refill on your cardiac medications before your next appointment, please call your pharmacy*   Lab Work: NONE If you have labs (blood work) drawn today and your tests are completely normal, you will receive your results only by: MyChart Message (if you have MyChart) OR A paper copy in the mail If you have any lab test that is abnormal or we need to change your treatment, we will call you to review the results.   Testing/Procedures: Echocardiogram in 6 months prior to f/u with [facility] Your physician has requested that you have an echocardiogram. Echocardiography is a painless test that uses sound waves to create images of your heart. It provides your doctor with information about the size and shape of your heart and how well your heart's chambers and valves are working. This procedure takes approximately one hour. There are no restrictions for this procedure. Please do NOT wear cologne, perfume, aftershave, or lotions (deodorant is allowed). Please arrive 15 minutes prior to your appointment time.  Please note: We ask at that you not bring children with you during ultrasound (echo/ vascular) testing. Due to room size and safety concerns, children are not allowed in the ultrasound rooms during exams. Our front office staff cannot provide observation of children in our lobby area while testing is being conducted. An adult accompanying a patient to their appointment will only be allowed in the ultrasound room at the discretion of the ultrasound technician under special circumstances. We apologize for any inconvenience.    Follow-Up: At [facility], you and your health needs are our priority.  As part of our continuing mission to provide you with exceptional heart care, we have created designated  Provider Care Teams.  These Care Teams include your primary Cardiologist (physician) and Advanced Practice Providers (APPs -  Physician Assistants and Nurse Practitioners) who all work together to provide you with the care you need, when you need it.  We recommend signing up for the patient portal called "MyChart".  Sign up information is provided on this After Visit Summary.  MyChart is used to connect with patients for Virtual Visits (Telemedicine).  Patients are able to view lab/test results, encounter notes, upcoming appointments, etc.  Non-urgent messages can be sent to your provider as well.   To learn more about what you can do with MyChart, go to [email or web address].    Your next appointment:   6 months  Provider:   Anne Fu, MD    1st Floor: - Lobby - Registration  - Pharmacy  - Lab - Cafe  2nd Floor: - PV Lab - Diagnostic Testing (echo, CT, nuclear med)  3rd Floor: - Vacant  4th Floor: - TCTS (cardiothoracic surgery) - AFib Clinic - Structural Heart Clinic - Vascular Surgery  - Vascular Ultrasound  5th Floor: - HeartCare Cardiology (general and EP) - Clinical Pharmacy for coumadin, hypertension, lipid, weight-loss medications, and med management appointments    Valet parking services will be available as well.

## 2023-12-27 DIAGNOSIS — H353133 Nonexudative age-related macular degeneration, bilateral, advanced atrophic without subfoveal involvement: Secondary | ICD-10-CM | POA: Diagnosis not present

## 2023-12-27 DIAGNOSIS — H353221 Exudative age-related macular degeneration, left eye, with active choroidal neovascularization: Secondary | ICD-10-CM | POA: Diagnosis not present

## 2023-12-27 DIAGNOSIS — E113393 Type 2 diabetes mellitus with moderate nonproliferative diabetic retinopathy without macular edema, bilateral: Secondary | ICD-10-CM | POA: Diagnosis not present

## 2023-12-27 DIAGNOSIS — H353211 Exudative age-related macular degeneration, right eye, with active choroidal neovascularization: Secondary | ICD-10-CM | POA: Diagnosis not present

## 2023-12-30 ENCOUNTER — Ambulatory Visit (INDEPENDENT_AMBULATORY_CARE_PROVIDER_SITE_OTHER): Payer: Medicare Other | Admitting: Internal Medicine

## 2023-12-30 ENCOUNTER — Encounter: Payer: Self-pay | Admitting: Internal Medicine

## 2023-12-30 VITALS — Ht 70.0 in | Wt 160.4 lb

## 2023-12-30 DIAGNOSIS — Z794 Long term (current) use of insulin: Secondary | ICD-10-CM | POA: Diagnosis not present

## 2023-12-30 DIAGNOSIS — D3502 Benign neoplasm of left adrenal gland: Secondary | ICD-10-CM | POA: Diagnosis not present

## 2023-12-30 DIAGNOSIS — E1165 Type 2 diabetes mellitus with hyperglycemia: Secondary | ICD-10-CM

## 2023-12-30 DIAGNOSIS — E1159 Type 2 diabetes mellitus with other circulatory complications: Secondary | ICD-10-CM | POA: Diagnosis not present

## 2023-12-30 DIAGNOSIS — E1142 Type 2 diabetes mellitus with diabetic polyneuropathy: Secondary | ICD-10-CM

## 2023-12-30 LAB — POCT GLUCOSE (DEVICE FOR HOME USE): POC Glucose: 126 mg/dL — AB (ref 70–99)

## 2023-12-30 LAB — POCT GLYCOSYLATED HEMOGLOBIN (HGB A1C): Hemoglobin A1C: 7.7 % — AB (ref 4.0–5.6)

## 2023-12-30 MED ORDER — INSULIN PEN NEEDLE 32G X 4 MM MISC: 1.0000 | Freq: Four times a day (QID) | 3 refills | Status: DC

## 2023-12-30 MED ORDER — EMPAGLIFLOZIN 25 MG PO TABS: ORAL_TABLET | ORAL | 0 refills | Status: DC

## 2023-12-30 MED ORDER — METFORMIN HCL 1000 MG PO TABS: 1000.0000 mg | ORAL_TABLET | Freq: Two times a day (BID) | ORAL | Status: DC

## 2023-12-30 MED ORDER — TRESIBA FLEXTOUCH 100 UNIT/ML ~~LOC~~ SOPN: 10.0000 [IU] | PEN_INJECTOR | Freq: Every day | SUBCUTANEOUS | 3 refills | Status: DC

## 2023-12-30 MED ORDER — NOVOLOG FLEXPEN 100 UNIT/ML ~~LOC~~ SOPN: PEN_INJECTOR | SUBCUTANEOUS | 3 refills | Status: DC

## 2023-12-30 MED ORDER — PIOGLITAZONE HCL 30 MG PO TABS: 30.0000 mg | ORAL_TABLET | Freq: Every day | ORAL | 3 refills | Status: DC

## 2023-12-30 NOTE — Progress Notes (Signed)
 Name: Keith Espinoza  Age/ Sex: 83 y.o., male   MRN/ DOB: [id number], 07-18-1941     PCP: Keith Aspen, MD   Reason for Endocrinology Evaluation: Type 2 Diabetes Mellitus  Initial Endocrine Consultative Visit: 03/24/2021    PATIENT IDENTIFIER: Keith Espinoza is a 84 y.o. male with a past medical history of Right BK amputations due  PVD, T2DM and Pancreatitis .Marland Kitchen The patient has followed with Endocrinology clinic since 03/24/2021 for consultative assistance with management of his diabetes.  DIABETIC HISTORY:  [name] was diagnosed with DM 2015, Trulicity caused weight loss, Has been on Glimepiride in the past. His hemoglobin A1c has ranged from 6.6% in 2020, peaking at 7.7% in 2022.     Transitioned care from Dr. Ronnette Hila in 2022    On his initial visit to our clinic he had an A1c 7.5 % He was on jardiance, Metformin and Basal insulin, which I have reduced basal insulin   Started pioglitazoe 10/2021  Patient was diagnosed with DKA 06/2023 with elevated beta hydroxybutyrate at 5.1   ADRENAL HISTORY : Pt found to have a 1 cm left adrenal nodule on CT imaging of abdomen on 01/31/2021. Of note, the pt was noted to have hypokalemia , this is chronic and has been on KCL for ~ 5 yrs      Aldo, renin, 24-hr urinary cortisol and catecholamine have all come back normal 03/2021  He was noted with slight elevation of plasma normetanephrine and metanephrine 2023.  24-hour urine cortisol normal 04/2023.  His metanephrine 24-hour urine was canceled at the lab   SUBJECTIVE:   During the last visit (04/01/2023): A1c 7.2%     Today (12/30/2023): [name] is here for a follow up on diabetes management.  He is accompanied by his grand daughter . He has been out of dexcom for ~ 1 month, as the DME supplier needs an updated office visit within 3 months.  He attempted to use a glucose meter at home but the patient states it broke.  Patient continues to follow-up with  cardiology for aortic stenosis, HTN, and dyslipidemia He continues to follow-up with orthopedics for right below-knee amputation He continues to follow-up with ophthalmology for macular degeneration  He is s/p circumcision 10/12/2023  Denies nausea, vomiting Denies constipation or diarrhea    HOME DIABETES REGIMEN:  Tresiba 10 units daily  Metformin 1000 mg Twice a day  Jardiance 25 mg daily Pioglitazone 30 mg daily  CF: Novolog (BG-130/45) TIDQAC     Statin: Intolerant to Simvastatin due to severe myalgia  ACE-I/ARB: yes      CONTINUOUS GLUCOSE MONITORING RECORD INTERPRETATION: n/a   DIABETIC COMPLICATIONS: Microvascular complications:  Neuropathy, macular degeneration Denies: CKD Last Eye Exam: Completed 04/15/2022  Macrovascular complications:  CAD, PVD Denies: CVA   HISTORY:  Past Medical History:  Past Medical History:  Diagnosis Date   Allergic rhinitis    Anemia    Aortic stenosis    Arthritis    Basal cell carcinoma 11/01/2019    bcc left chest treatment TX cx3 54fu    CHF (congestive heart failure) (HCC)    Chronic leg pain    right   Chronic lower back pain    Coronary artery disease    a. Stenting to RCA 2004; staged DES to LAD and Cx 2004. DES to [facility] 2012. b. DES to mCx, PTCA to dCx 11/2011. c. Lateral wall MI 2013 s/p PTCA to distal Cx & DES to mid OM2  11/2011. d. Low risk nuc 04/2014, EF wnl.   COVID-19    Diabetes mellitus    Insulin dependent   Diabetic neuropathy (HCC)    MILD   Diverticulosis    Dysrhythmia    [name] syndrome    Gout    right wrist; right foot; right elbow; have had it since 1970's   H/O hiatal hernia    Heart murmur    History of echocardiogram    aortic sclerosis per echo 12/09 EF 65%, otherwise normal   History of hemorrhoids    BLEEDING   History of kidney stones    h/o   Hypertension    Diagnosed 1995    Myocardial infarction [facility])    Pancreatic pseudocyst    a. s/p remote drainage 2006.    Thrombocytopenia (HCC)    Seen on oldest labs in system from 2004   Vitamin B 12 deficiency    orally replaced   Past Surgical History:  Past Surgical History:  Procedure Laterality Date   ABDOMINAL AORTOGRAM W/LOWER EXTREMITY Bilateral 08/08/2020   Procedure: ABDOMINAL AORTOGRAM W/LOWER EXTREMITY;  Surgeon: Chuck Hint, MD;  Location: [facility] INVASIVE CV LAB;  Service: Cardiovascular;  Laterality: Bilateral;   AMPUTATION Right 12/12/2020   Procedure: RIGHT BELOW KNEE AMPUTATION;  Surgeon: Nadara Mustard, MD;  Location: [facility] OR;  Service: Orthopedics;  Laterality: Right;   BACK SURGERY     "total of 3 times" S/P fall    CARPAL TUNNEL RELEASE Bilateral    CHOLECYSTECTOMY  1990's   CIRCUMCISION N/A 10/12/2023   Procedure: Dorsal slit;  Surgeon: Despina Arias, MD;  Location: [facility] ORS;  Service: Urology;  Laterality: N/A;   COLONOSCOPY     COLONOSCOPY Left 07/08/2023   Procedure: COLONOSCOPY;  Surgeon: Shellia Cleverly, DO;  Location: [facility] ENDOSCOPY;  Service: Gastroenterology;  Laterality: Left;   CORONARY ANGIOPLASTY  11/11/11   CORONARY ANGIOPLASTY WITH STENT PLACEMENT  09/30/2011   "1 then; makes a total of 4"   CORONARY ANGIOPLASTY WITH STENT PLACEMENT  11/11/11   "1; makes a total of 5"   ESOPHAGOGASTRODUODENOSCOPY Left 07/08/2023   Procedure: ESOPHAGOGASTRODUODENOSCOPY (EGD);  Surgeon: Shellia Cleverly, DO;  Location: [facility] ENDOSCOPY;  Service: Gastroenterology;  Laterality: Left;   HEMOSTASIS CLIP PLACEMENT  07/08/2023   Procedure: HEMOSTASIS CLIP PLACEMENT;  Surgeon: Shellia Cleverly, DO;  Location: [facility] ENDOSCOPY;  Service: Gastroenterology;;   HOT HEMOSTASIS N/A 07/08/2023   Procedure: HOT HEMOSTASIS (ARGON PLASMA COAGULATION/BICAP);  Surgeon: Shellia Cleverly, DO;  Location: [facility] ENDOSCOPY;  Service: Gastroenterology;  Laterality: N/A;   INGUINAL HERNIA REPAIR  2003   right   JOINT REPLACEMENT Right 04/03/2002   hip replacment   KNEE ARTHROSCOPY  1990's   left   LEFT HEART CATH  AND CORONARY ANGIOGRAPHY N/A 04/25/2023   Procedure: LEFT HEART CATH AND CORONARY ANGIOGRAPHY;  Surgeon: Marykay Lex, MD;  Location: [facility] INVASIVE CV LAB;  Service: Cardiovascular;  Laterality: N/A;   LEFT HEART CATHETERIZATION WITH CORONARY ANGIOGRAM N/A 09/30/2011   Procedure: LEFT HEART CATHETERIZATION WITH CORONARY ANGIOGRAM;  Surgeon: Corky Crafts, MD;  Location: [facility] CATH LAB;  Service: Cardiovascular;  Laterality: N/A;  possible PCI   LEFT HEART CATHETERIZATION WITH CORONARY ANGIOGRAM N/A 11/15/2011   Procedure: LEFT HEART CATHETERIZATION WITH CORONARY ANGIOGRAM;  Surgeon: Corky Crafts, MD;  Location: [facility] CATH LAB;  Service: Cardiovascular;  Laterality: N/A;   PERCUTANEOUS CORONARY STENT INTERVENTION (PCI-S)  09/30/2011   Procedure: PERCUTANEOUS CORONARY STENT INTERVENTION (PCI-S);  Surgeon: Corky Crafts, MD;  Location: [facility] CATH LAB;  Service: Cardiovascular;;   PERCUTANEOUS CORONARY STENT INTERVENTION (PCI-S) N/A 11/11/2011   Procedure: PERCUTANEOUS CORONARY STENT INTERVENTION (PCI-S);  Surgeon: Corky Crafts, MD;  Location: [facility] CATH LAB;  Service: Cardiovascular;  Laterality: N/A;   PERIPHERAL VASCULAR BALLOON ANGIOPLASTY Right 08/08/2020   Procedure: PERIPHERAL VASCULAR BALLOON ANGIOPLASTY;  Surgeon: Chuck Hint, MD;  Location: [facility] INVASIVE CV LAB;  Service: Cardiovascular;  Laterality: Right;  Posterior tibial    SHOULDER SURGERY Right    X 2   STUMP REVISION Right 01/09/2021   Procedure: REVISION RIGHT BELOW KNEE AMPUTATION;  Surgeon: Nadara Mustard, MD;  Location: [facility] OR;  Service: Orthopedics;  Laterality: Right;   TONSILLECTOMY  ~ 1948   TOTAL HIP REVISION Right 04/13/2019   Procedure: RIGHT TOTAL HIP REVISION-POSTERIOR  APPROACH LATERAL;  Surgeon: Eldred Manges, MD;  Location: [facility] OR;  Service: Orthopedics;  Laterality: Right;   TOTAL KNEE ARTHROPLASTY Left 07/23/2016   Procedure: LEFT TOTAL KNEE ARTHROPLASTY;  Surgeon: Eldred Manges, MD;  Location: [facility] OR;   Service: Orthopedics;  Laterality: Left;   Social History:  reports that he has quit smoking. His smoking use included cigarettes. His smokeless tobacco use includes chew. He reports that he does not drink alcohol and does not use drugs. Family History:  Family History  Problem Relation Age of Onset   Diabetes Mother    Hyperlipidemia Mother    Hypertension Mother    Cancer Father    Hypertension Father    Diabetes Sister    Hypertension Sister    Cancer Brother    Heart attack Neg Hx      HOME MEDICATIONS: Allergies as of 12/30/2023       Reactions   Simvastatin Other (See Comments)   SEVERE MYALGIAS   Zetia [ezetimibe] Other (See Comments)   MYALGIAS   Dilaudid [hydromorphone Hcl] Other (See Comments)   hallucination        Medication List        Accurate as of December 30, 2023  3:01 PM. If you have any questions, ask your nurse or doctor.          acetaminophen 500 MG tablet Commonly known as: TYLENOL Take 1,000 mg by mouth every 6 (six) hours as needed (pain).   Acetaminophen Extra Strength 500 MG Tabs Take 2 tablets (1,000 mg total) by mouth every 6 (six) hours.   allopurinol 300 MG tablet Commonly known as: ZYLOPRIM Take 300 mg by mouth daily.   aspirin EC 81 MG tablet Take 81 mg by mouth daily. Swallow whole.   B-12 1000 MCG Tabs Take 1,000 mcg by mouth daily.   Basaglar KwikPen 100 UNIT/ML Inject 12 Units into the skin daily.   clopidogrel 75 MG tablet Commonly known as: PLAVIX Take 1 tablet (75 mg total) by mouth daily.   clotrimazole-betamethasone cream Commonly known as: LOTRISONE Apply topically 2 (two) times daily.   Dexcom G7 Sensor Misc 1 Device by Does not apply route as directed.   empagliflozin 25 MG Tabs tablet Commonly known as: Jardiance TAKE 1 TABLET(25 MG) BY MOUTH DAILY   Fish Oil 500 MG Caps Take 500 mg by mouth daily.   furosemide 20 MG tablet Commonly known as: LASIX Take 1 tablet (20 mg total) by mouth  daily. What changed: how much to take   gabapentin 300 MG capsule Commonly known as: NEURONTIN Take 600 mg by mouth at bedtime.   gabapentin  400 MG capsule Commonly known as: NEURONTIN Take 1 capsule (400 mg total) by mouth daily.   Insulin Pen Needle 32G X 4 MM Misc 1 Device by Does not apply route in the morning, at noon, in the evening, and at bedtime.   isosorbide mononitrate 60 MG 24 hr tablet Commonly known as: IMDUR Take 1 tablet (60 mg total) by mouth daily.   Magnesium 400 MG Caps Take 400 mg by mouth at bedtime.   metFORMIN 1000 MG tablet Commonly known as: GLUCOPHAGE Take 1 tablet (1,000 mg total) by mouth 2 (two) times daily.   metoprolol succinate 25 MG 24 hr tablet Commonly known as: TOPROL-XL Take 1 tablet (25 mg total) by mouth daily.   nitroGLYCERIN 0.4 MG SL tablet Commonly known as: NITROSTAT Place 0.4 mg under the tongue every 5 (five) minutes x 3 doses as needed for chest pain.   NovoLOG FlexPen 100 UNIT/ML FlexPen Generic drug: insulin aspart Max daily 30 units What changed:  how much to take how to take this when to take this reasons to take this additional instructions   pantoprazole 40 MG tablet Commonly known as: PROTONIX Take 1 tablet (40 mg total) by mouth 2 (two) times daily.   pioglitazone 30 MG tablet Commonly known as: ACTOS Take 30 mg by mouth daily. What changed: Another medication with the same name was removed. Continue taking this medication, and follow the directions you see here. Changed by: Johnney Ou Aadi Bordner   Potassium Chloride ER 20 MEQ Tbcr Take 1 tablet (20 mEq total) by mouth daily.   PRESCRIPTION MEDICATION Apply 1 application  topically every 3 (three) days. Duraderm   QC TUMERIC COMPLEX PO Take 500 mg by mouth at bedtime.   rosuvastatin 20 MG tablet Commonly known as: CRESTOR Take 1 tablet (20 mg total) by mouth daily.   sacubitril-valsartan 24-26 MG Commonly known as: ENTRESTO Take 1 tablet by  mouth 2 (two) times daily.   senna-docusate 8.6-50 MG tablet Commonly known as: Senokot-S Take 1 tablet by mouth at bedtime. What changed: when to take this   tamsulosin 0.4 MG Caps capsule Commonly known as: FLOMAX Take 1 capsule (0.4 mg total) by mouth daily after supper.   terazosin 5 MG capsule Commonly known as: HYTRIN Take 5 mg by mouth 2 (two) times daily.   traMADol 50 MG tablet Commonly known as: Ultram Take 1 tablet (50 mg total) by mouth every 6 (six) hours as needed.   Evaristo Bury FlexTouch 100 UNIT/ML FlexTouch Pen Generic drug: insulin degludec Inject 12 Units into the skin daily. What changed:  how much to take additional instructions   VITAMIN C PO Take 1,000 mcg by mouth at bedtime.   Vitamin D3 50 MCG (2000 UT) Tabs Take 2,000 Units by mouth at bedtime.         OBJECTIVE:   Vital Signs:Ht 5\' 10"  (1.778 m)   Wt 160 lb 6.4 oz (72.8 kg)   BMI 23.02 kg/m    Wt Readings from Last 3 Encounters:  12/30/23 160 lb 6.4 oz (72.8 kg)  12/19/23 166 lb (75.3 kg)  10/12/23 160 lb (72.6 kg)     Exam: General: Pt appears well and is in NAD  Neck: General: Supple without adenopathy. Thyroid: Thyroid size normal.  No goiter or nodules appreciated.   Lungs: Clear with good BS bilat   Heart: RRR  Neuro: MS is good with appropriate affect, pt is alert and Ox3     DATA REVIEWED:  Lab  Results  Component Value Date   HGBA1C 7.7 (A) 12/30/2023   HGBA1C 9.0 (H) 09/29/2023   HGBA1C 7.1 (A) 04/01/2023    Latest Reference Range & Units 09/29/23 10:18  Sodium 135 - 145 mmol/L 137  Potassium 3.5 - 5.1 mmol/L 3.5  Chloride 98 - 111 mmol/L 100  CO2 22 - 32 mmol/L 26  Glucose 70 - 99 mg/dL 811 (H)  Mean Plasma Glucose mg/dL 914  BUN 8 - 23 mg/dL 24 (H)  Creatinine 7.82 - 1.24 mg/dL 9.56  Calcium 8.9 - 21.3 mg/dL 9.7  Anion gap 5 - 15  11  GFR, Estimated >60 mL/min >60  (H): Data is abnormally high  CT abdomen 01/31/2021   Indeterminate 10 mm left  adrenal nodule, likely adenoma.     MRI abdomen 12/2022 Adrenals/Urinary tract: 1.9 x 1.3 cm left adrenal mass is seen which shows signal dropout on chemical shift imaging, consistent with a benign adrenal adenoma. This is also stable compared to previous CT. No suspicious renal masses identified. No evidence of hydronephrosis.    ASSESSMENT / PLAN / RECOMMENDATIONS:   1) Type 2 Diabetes Mellitus, suboptimally controlled, With neuropathic and macrovascular  complications - Most recent A1c of 7.7 %. Goal A1c < 7.5 %.    - A1c has trended down from 9.0% to 7.7% - NO CGM data t as he has been out of it due to insurance requirements - Pt with hx of pancreatitis , hence DPP-4 inhibitors and GLP-1 agonists are contra-indicated.  -No changes today, patient was encouraged to continue to use NovoLog based on correction scale before each meal    MEDICATIONS: Continue Tresiba  10 units daily  Continue Metformin 1000 mg Twice a day  Continue Jardiance 25 mg daily Continue pioglitazone 30 mg daily  Continue correction factor: NovoLog (BG -130/45) with each meal    EDUCATION / INSTRUCTIONS: BG monitoring instructions: Patient is instructed to check his blood sugars 1 times a day, fasting . Call [facility] if: BG persistently < 70  I reviewed the Rule of 15 for the treatment of hypoglycemia in detail with the patient. Literature supplied.    2) Diabetic complications:  Eye:  Unknown to have diabetic retinopathy.  Neuro/ Feet: Does  have known diabetic peripheral neuropathy .  Renal: Patient does not have known baseline CKD. He   is  on an ACEI/ARB at present.    3) Left Adrenal Adenoma:   - Hormonal workup has ruled out cushing syndrome (2022), Pheo and hyperaldo 06/2022 .Aldo, renin, and 24-hour urinary cortisol were normal in 2024 . -He was noted with slight elevation of plasma normetanephrine, but unfortunately his 24-hour urine sample was canceled through lab,  will repeat by next visit - Repeat adrenal imaging showed stability 12/2022 - no further imaging recommended     F/U in 4 months     I spent 25 minutes preparing to see the patient by review of recent labs, imaging and procedures, obtaining and reviewing separately obtained history, communicating with the patient/family or caregiver, ordering medications, tests or procedures, and documenting clinical information in the EHR including the differential Dx, treatment, and any further evaluation and other management     Signed electronically by: Lyndle Herrlich, MD  [facility] Endocrinology  [facility] [address] [place] [place], [place] [address] Phone: [phone] FAX: [phone]   CC: Keith Aspen, MD [address] [place] [address] Phone: [phone]  Fax: [phone]  Return to Endocrinology clinic as below: Future Appointments  Date Time Provider Department Center  06/22/2024  9:15 AM [id number]

## 2023-12-30 NOTE — Patient Instructions (Addendum)
-   Take Tresiba 10 units ONCE daily  - Continue Metformin 1000 mg Twice a day  - Continue Jardiance 25 mg daily - Continue  Actos 30 mg , 1 tablet daily   - Novolog correctional insulin:  Use the scale below to help guide you before meals   Blood sugar before meal Number of units to inject  Less than 175 0 unit  176 - 215 1 units  216 - 255 2 units  256 - 295 3 units  296- 335 4 units  336 - 375 5 units  376 - 410 6 units        HOW TO TREAT LOW BLOOD SUGARS (Blood sugar LESS THAN 70 MG/DL) Please follow the RULE OF 15 for the treatment of hypoglycemia treatment (when your (blood sugars are less than 70 mg/dL)   STEP 1: Take 15 grams of carbohydrates when your blood sugar is low, which includes:  3-4 GLUCOSE TABS  OR 3-4 OZ OF JUICE OR REGULAR SODA OR ONE TUBE OF GLUCOSE GEL    STEP 2: RECHECK blood sugar in 15 MINUTES STEP 3: If your blood sugar is still low at the 15 minute recheck --> then, go back to STEP 1 and treat AGAIN with another 15 grams of carbohydrates.

## 2024-01-02 DIAGNOSIS — I5043 Acute on chronic combined systolic (congestive) and diastolic (congestive) heart failure: Secondary | ICD-10-CM | POA: Diagnosis not present

## 2024-01-02 DIAGNOSIS — I2510 Atherosclerotic heart disease of native coronary artery without angina pectoris: Secondary | ICD-10-CM | POA: Diagnosis not present

## 2024-01-02 DIAGNOSIS — I480 Paroxysmal atrial fibrillation: Secondary | ICD-10-CM | POA: Diagnosis not present

## 2024-01-02 DIAGNOSIS — E1342 Other specified diabetes mellitus with diabetic polyneuropathy: Secondary | ICD-10-CM | POA: Diagnosis not present

## 2024-01-02 DIAGNOSIS — N1831 Chronic kidney disease, stage 3a: Secondary | ICD-10-CM | POA: Diagnosis not present

## 2024-01-04 DIAGNOSIS — I130 Hypertensive heart and chronic kidney disease with heart failure and stage 1 through stage 4 chronic kidney disease, or unspecified chronic kidney disease: Secondary | ICD-10-CM | POA: Diagnosis not present

## 2024-01-04 DIAGNOSIS — E1142 Type 2 diabetes mellitus with diabetic polyneuropathy: Secondary | ICD-10-CM | POA: Diagnosis not present

## 2024-01-04 DIAGNOSIS — H353211 Exudative age-related macular degeneration, right eye, with active choroidal neovascularization: Secondary | ICD-10-CM | POA: Diagnosis not present

## 2024-01-04 DIAGNOSIS — H353132 Nonexudative age-related macular degeneration, bilateral, intermediate dry stage: Secondary | ICD-10-CM | POA: Diagnosis not present

## 2024-01-04 DIAGNOSIS — E1165 Type 2 diabetes mellitus with hyperglycemia: Secondary | ICD-10-CM | POA: Diagnosis not present

## 2024-01-04 DIAGNOSIS — Z0000 Encounter for general adult medical examination without abnormal findings: Secondary | ICD-10-CM | POA: Diagnosis not present

## 2024-01-04 DIAGNOSIS — D500 Iron deficiency anemia secondary to blood loss (chronic): Secondary | ICD-10-CM | POA: Diagnosis not present

## 2024-01-04 DIAGNOSIS — M1A9XX0 Chronic gout, unspecified, without tophus (tophi): Secondary | ICD-10-CM | POA: Diagnosis not present

## 2024-01-04 DIAGNOSIS — Z23 Encounter for immunization: Secondary | ICD-10-CM | POA: Diagnosis not present

## 2024-01-04 DIAGNOSIS — K2971 Gastritis, unspecified, with bleeding: Secondary | ICD-10-CM | POA: Diagnosis not present

## 2024-01-04 DIAGNOSIS — E1151 Type 2 diabetes mellitus with diabetic peripheral angiopathy without gangrene: Secondary | ICD-10-CM | POA: Diagnosis not present

## 2024-01-04 DIAGNOSIS — N1831 Chronic kidney disease, stage 3a: Secondary | ICD-10-CM | POA: Diagnosis not present

## 2024-01-04 DIAGNOSIS — E1122 Type 2 diabetes mellitus with diabetic chronic kidney disease: Secondary | ICD-10-CM | POA: Diagnosis not present

## 2024-01-10 DIAGNOSIS — I5043 Acute on chronic combined systolic (congestive) and diastolic (congestive) heart failure: Secondary | ICD-10-CM | POA: Diagnosis not present

## 2024-01-10 DIAGNOSIS — N1831 Chronic kidney disease, stage 3a: Secondary | ICD-10-CM | POA: Diagnosis not present

## 2024-01-10 DIAGNOSIS — I480 Paroxysmal atrial fibrillation: Secondary | ICD-10-CM | POA: Diagnosis not present

## 2024-01-10 DIAGNOSIS — E1342 Other specified diabetes mellitus with diabetic polyneuropathy: Secondary | ICD-10-CM | POA: Diagnosis not present

## 2024-02-01 DIAGNOSIS — I5043 Acute on chronic combined systolic (congestive) and diastolic (congestive) heart failure: Secondary | ICD-10-CM | POA: Diagnosis not present

## 2024-02-01 DIAGNOSIS — I480 Paroxysmal atrial fibrillation: Secondary | ICD-10-CM | POA: Diagnosis not present

## 2024-02-01 DIAGNOSIS — E1342 Other specified diabetes mellitus with diabetic polyneuropathy: Secondary | ICD-10-CM | POA: Diagnosis not present

## 2024-02-01 DIAGNOSIS — N1831 Chronic kidney disease, stage 3a: Secondary | ICD-10-CM | POA: Diagnosis not present

## 2024-02-01 DIAGNOSIS — I2510 Atherosclerotic heart disease of native coronary artery without angina pectoris: Secondary | ICD-10-CM | POA: Diagnosis not present

## 2024-02-09 DIAGNOSIS — I480 Paroxysmal atrial fibrillation: Secondary | ICD-10-CM | POA: Diagnosis not present

## 2024-02-09 DIAGNOSIS — N1831 Chronic kidney disease, stage 3a: Secondary | ICD-10-CM | POA: Diagnosis not present

## 2024-02-09 DIAGNOSIS — I5043 Acute on chronic combined systolic (congestive) and diastolic (congestive) heart failure: Secondary | ICD-10-CM | POA: Diagnosis not present

## 2024-02-10 DIAGNOSIS — E1165 Type 2 diabetes mellitus with hyperglycemia: Secondary | ICD-10-CM | POA: Diagnosis not present

## 2024-02-10 DIAGNOSIS — R42 Dizziness and giddiness: Secondary | ICD-10-CM | POA: Diagnosis not present

## 2024-02-10 DIAGNOSIS — I2510 Atherosclerotic heart disease of native coronary artery without angina pectoris: Secondary | ICD-10-CM | POA: Diagnosis not present

## 2024-02-10 DIAGNOSIS — Z720 Tobacco use: Secondary | ICD-10-CM | POA: Diagnosis not present

## 2024-02-10 DIAGNOSIS — N1831 Chronic kidney disease, stage 3a: Secondary | ICD-10-CM | POA: Diagnosis not present

## 2024-02-10 DIAGNOSIS — E1151 Type 2 diabetes mellitus with diabetic peripheral angiopathy without gangrene: Secondary | ICD-10-CM | POA: Diagnosis not present

## 2024-02-10 DIAGNOSIS — E1142 Type 2 diabetes mellitus with diabetic polyneuropathy: Secondary | ICD-10-CM | POA: Diagnosis not present

## 2024-02-10 DIAGNOSIS — I350 Nonrheumatic aortic (valve) stenosis: Secondary | ICD-10-CM | POA: Diagnosis not present

## 2024-02-10 DIAGNOSIS — D500 Iron deficiency anemia secondary to blood loss (chronic): Secondary | ICD-10-CM | POA: Diagnosis not present

## 2024-02-10 DIAGNOSIS — S88119D Complete traumatic amputation at level between knee and ankle, unspecified lower leg, subsequent encounter: Secondary | ICD-10-CM | POA: Diagnosis not present

## 2024-02-10 DIAGNOSIS — I5042 Chronic combined systolic (congestive) and diastolic (congestive) heart failure: Secondary | ICD-10-CM | POA: Diagnosis not present

## 2024-02-14 ENCOUNTER — Telehealth: Payer: Self-pay | Admitting: Cardiology

## 2024-02-14 DIAGNOSIS — H353211 Exudative age-related macular degeneration, right eye, with active choroidal neovascularization: Secondary | ICD-10-CM | POA: Diagnosis not present

## 2024-02-14 DIAGNOSIS — H353133 Nonexudative age-related macular degeneration, bilateral, advanced atrophic without subfoveal involvement: Secondary | ICD-10-CM | POA: Diagnosis not present

## 2024-02-14 DIAGNOSIS — E113393 Type 2 diabetes mellitus with moderate nonproliferative diabetic retinopathy without macular edema, bilateral: Secondary | ICD-10-CM | POA: Diagnosis not present

## 2024-02-14 DIAGNOSIS — H353221 Exudative age-related macular degeneration, left eye, with active choroidal neovascularization: Secondary | ICD-10-CM | POA: Diagnosis not present

## 2024-02-14 NOTE — Telephone Encounter (Signed)
 Pt c/o BP issue: STAT if pt c/o blurred vision, one-sided weakness or slurred speech  1. What are your last 5 BP readings? Pt daughter stated it been in the lower 50s and 40s but she'd unsure if he took it today since she's at work.   2. Are you having any other symptoms (ex. Dizziness, headache, blurred vision, passed out)? Dizziness  3. What is your BP issue? Pt's daughter Broderick Canning is requesting a callback regarding her concerns with pt's Bp and a plan. Please advise

## 2024-02-14 NOTE — Telephone Encounter (Addendum)
 Spoke with daughter and she states patient has been dizzy for a couple of weeks. She too him to see PCP and he decreased his metoprolol  from 25 mg to 12.5 mg daily. They were advised if he is still having the dizzy episodes he will need to follow up with cardiology. She states his BP and HR has been low as well. She is unable to give any numbers but states his BP is monitored daily and sent directly to PCP office. No chest pain or SOB.   Did advise when changing positions change them slowly. When is BP is low try elevating legs, staying hydrated and eating food.  Appointment scheduled will forward to provider. ED precautions discussed.

## 2024-02-17 NOTE — Telephone Encounter (Signed)
 Keith Madura, MD  You; Staley, Demetris R, [place] hours ago (9:59 PM)    OK to stop metoprolol     Daughter aware to discontinue Metoprolol  per Dr Renna Cary order.  Medication removed from his list.  She will call back if any further questions or concerns.

## 2024-02-24 ENCOUNTER — Other Ambulatory Visit (HOSPITAL_COMMUNITY): Payer: Self-pay

## 2024-02-24 ENCOUNTER — Encounter: Payer: Self-pay | Admitting: Cardiology

## 2024-02-24 ENCOUNTER — Ambulatory Visit: Attending: Cardiology | Admitting: Cardiology

## 2024-02-24 VITALS — BP 90/50 | HR 64 | Ht 70.0 in | Wt 157.4 lb

## 2024-02-24 DIAGNOSIS — Z720 Tobacco use: Secondary | ICD-10-CM | POA: Insufficient documentation

## 2024-02-24 DIAGNOSIS — R42 Dizziness and giddiness: Secondary | ICD-10-CM | POA: Diagnosis not present

## 2024-02-24 DIAGNOSIS — I739 Peripheral vascular disease, unspecified: Secondary | ICD-10-CM | POA: Diagnosis not present

## 2024-02-24 DIAGNOSIS — I2510 Atherosclerotic heart disease of native coronary artery without angina pectoris: Secondary | ICD-10-CM | POA: Insufficient documentation

## 2024-02-24 DIAGNOSIS — E782 Mixed hyperlipidemia: Secondary | ICD-10-CM | POA: Diagnosis not present

## 2024-02-24 MED ORDER — RANOLAZINE ER 500 MG PO TB12
500.0000 mg | ORAL_TABLET | Freq: Two times a day (BID) | ORAL | 2 refills | Status: DC
  Filled 2024-02-24: qty 60, 30d supply, fill #0

## 2024-02-24 NOTE — Progress Notes (Signed)
 Cardiology Office Note:  .   Date:  02/24/2024  ID:  Keith Espinoza, DOB 1940/10/28, MRN [id number] PCP: Benedetta Bradley, MD  [facility] Providers Cardiologist:  Knox Perl, MD   History of Present Illness: .   Keith Espinoza is a 83 y.o. Caucasian male patient with coronary artery disease with stenting starting in 2004 to RCA, LAD, CX, last cardiac catheterization in July 2024 revealing diffuse coronary artery disease and was turndown for CABG presents here for follow-up of CAD.  Past medical history significant for hypertension, hypercholesterolemia, diabetes mellitus left below-knee amputation in 2018 due to diabetic foot and cellulitis and diabetic foot ulcer and also severe small vessel disease below-knee, stage IIIa chronic kidney disease and ongoing tobacco use disorder.  Discussed the use of AI scribe software for clinical note transcription with the patient, who gave verbal consent to proceed.  History of Present Illness Keith Espinoza is an 83 year old male with coronary artery disease who presents with dizziness and chest pain management. He is accompanied by his daughter. He was referred by his PCP due to low blood pressure and medication management issues. Dizziness initially occurred after reducing metoprolol  dosage due to low blood pressure. Dizziness improved but persisted until metoprolol  was stopped, leading to chest pain. Resuming half the dose of metoprolol  alleviated chest pain but caused mild dizziness. Currently, he takes metoprolol  12.5 mg daily.  Coronary artery disease is significant with multiple blockages and six stents. He is not a candidate for angioplasty or bypass surgery. He has a long history of chewing tobacco for about fifty years and does not intend to quit. He denies cigarette smoking.  Labs   Lab Results  Component Value Date   CHOL 154 04/25/2023   HDL 57 04/25/2023   LDLCALC 87 04/25/2023   TRIG 52 04/25/2023    CHOLHDL 2.7 04/25/2023   Lab Results  Component Value Date   NA 137 09/29/2023   K 3.5 09/29/2023   CO2 26 09/29/2023   GLUCOSE 258 (H) 09/29/2023   BUN 24 (H) 09/29/2023   CREATININE 1.13 09/29/2023   CALCIUM  9.7 09/29/2023   GFR 51.69 (L) 11/11/2022   EGFR 88 03/24/2021   GFRNONAA >60 09/29/2023      Latest Ref Rng & Units 09/29/2023   10:18 AM 07/10/2023    2:27 AM 07/09/2023    5:20 PM  BMP  Glucose 70 - 99 mg/dL 409  811  914   BUN 8 - 23 mg/dL 24  16  16    Creatinine 0.61 - 1.24 mg/dL 7.82  9.56  2.13   Sodium 135 - 145 mmol/L 137  136  134   Potassium 3.5 - 5.1 mmol/L 3.5  3.7  4.1   Chloride 98 - 111 mmol/L 100  100  96   CO2 22 - 32 mmol/L 26  28  27    Calcium  8.9 - 10.3 mg/dL 9.7  8.8  8.9       Latest Ref Rng & Units 09/29/2023   10:18 AM 07/10/2023    2:27 AM 07/09/2023    2:25 AM  CBC  WBC 4.0 - 10.5 K/uL 4.5  6.4  8.6   Hemoglobin 13.0 - 17.0 g/dL 08.6  9.6  9.7   Hematocrit 39.0 - 52.0 % 32.2  30.5  31.4   Platelets 150 - 400 K/uL 104  110  141    Lab Results  Component Value Date   HGBA1C 7.7 (A) 12/30/2023  Lab Results  Component Value Date   TSH 1.160 04/05/2014    External Labs:  PCP labs on Care Everywhere 01/05/2024:  Hb 11.0/HCT 33.1, platelets 99.  Serum glucose 193 mg, BUN 29, creatinine 1.23, EGFR 58 mL, potassium 3.9, sodium 142, LFTs normal.  Iron  studies normal limits.  Labs 07/22/2023:  Total cholesterol 98, triglycerides 106, HDL 42, LDL 36.  ROS  Review of Systems  Cardiovascular:  Positive for chest pain. Negative for dyspnea on exertion and leg swelling.  Neurological:  Positive for dizziness.    Physical Exam:   VS:  BP (!) 90/50   Pulse 64   Ht 5\' 10"  (1.778 m)   Wt 157 lb 6.4 oz (71.4 kg)   SpO2 97%   BMI 22.58 kg/m    Wt Readings from Last 3 Encounters:  02/24/24 157 lb 6.4 oz (71.4 kg)  12/30/23 160 lb 6.4 oz (72.8 kg)  12/19/23 166 lb (75.3 kg)    Physical Exam Constitutional:      Appearance: He is  ill-appearing.  Neck:     Vascular: Carotid bruit (bilateral) present. No JVD.  Cardiovascular:     Rate and Rhythm: Normal rate and regular rhythm.     Pulses: Intact distal pulses.          Femoral pulses are 0 on the right side and 0 on the left side.       Right popliteal pulse not accessible.        Right dorsalis pedis pulse not accessible.     Heart sounds: Heart sounds are distant. No murmur heard.    No gallop.  Pulmonary:     Effort: Pulmonary effort is normal.     Breath sounds: Normal breath sounds.  Abdominal:     General: Bowel sounds are normal.     Palpations: Abdomen is soft.  Musculoskeletal:        General: Deformity (left BKA) present.     Right lower leg: No edema.  Psychiatric:        Behavior: Behavior is cooperative.    Studies Reviewed: Aaron Aas    CARDIAC CATHETERIZATION 04/25/2023   DES-LAD and Cfx 2004, DES RCA 2012, DES mCfx, PTCA dCfx 2013      ECHOCARDIOGRAM COMPLETE 07/04/2023  1. Left ventricular ejection fraction, by estimation, is 40 to 45%. The left ventricle has mildly decreased function. The left ventricle demonstrates regional wall motion abnormalities (see scoring diagram/findings for description). There is mild left ventricular hypertrophy. Left ventricular diastolic parameters are consistent with Grade I diastolic dysfunction (impaired relaxation). 2. Right ventricular systolic function is normal. The right ventricular size is normal. 3. Left atrial size was mildly dilated. 4. The mitral valve is normal in structure. No evidence of mitral valve regurgitation. No evidence of mitral stenosis. 5. The right coronary and left coronary cusps are fused. The aortic valve is tricuspid. There is moderate calcification of the aortic valve. Aortic valve regurgitation is trivial. Mild to moderate aortic valve stenosis. Aortic valve area, by VTI measures 1.25 cm. Aortic valve mean gradient measures 13.3 mmHg. Aortic valve Vmax measures 2.34 m/s. 6. The  inferior vena cava is normal in size with greater than 50% respiratory variability, suggesting right atrial pressure of 3 mmHg.  EKG:    EKG Interpretation Date/Time:  Friday Feb 24 2024 13:30:26 EDT Ventricular Rate:  64 PR Interval:  180 QRS Duration:  138 QT Interval:  476 QTC Calculation: 491 R Axis:   91  Text Interpretation: EKG  02/24/2024: Normal sinus rhythm at the rate of 64 bpm, normal axis, right bundle branch block.  Inferior infarct old.  Compared to 07/07/2023, no significant change. Confirmed by Kelsen Celona, Jagadeesh ([id number]) on 02/24/2024 1:52:34 PM    Medications and allergies    Allergies  Allergen Reactions   Simvastatin Other (See Comments)    SEVERE MYALGIAS    Zetia [Ezetimibe] Other (See Comments)    MYALGIAS   Dilaudid  [Hydromorphone  Hcl] Other (See Comments)    hallucination     Current Outpatient Medications:    acetaminophen  (TYLENOL ) 500 MG tablet, Take 2 tablets (1,000 mg total) by mouth every 6 (six) hours. (Patient taking differently: Take 1,000 mg by mouth as needed.), Disp: 50 tablet, Rfl: 0   allopurinol  (ZYLOPRIM ) 300 MG tablet, Take 300 mg by mouth daily., Disp: , Rfl:    Ascorbic Acid (VITAMIN C PO), Take 1,000 mcg by mouth at bedtime., Disp: , Rfl:    aspirin  EC 81 MG tablet, Take 81 mg by mouth daily. Swallow whole., Disp: , Rfl:    Cholecalciferol  (VITAMIN D3) 50 MCG (2000 UT) TABS, Take 2,000 Units by mouth at bedtime., Disp: , Rfl:    clopidogrel  (PLAVIX ) 75 MG tablet, Take 1 tablet (75 mg total) by mouth daily., Disp: 90 tablet, Rfl: 3   Continuous Glucose Sensor (DEXCOM G7 SENSOR) MISC, 1 Device by Does not apply route as directed., Disp: 9 each, Rfl: 3   Cyanocobalamin  (B-12) 1000 MCG TABS, Take 1,000 mcg by mouth daily., Disp: , Rfl:    empagliflozin  (JARDIANCE ) 25 MG TABS tablet, TAKE 1 TABLET(25 MG) BY MOUTH DAILY, Disp: 90 tablet, Rfl: 0   furosemide  (LASIX ) 20 MG tablet, Take 1 tablet (20 mg total) by mouth daily. (Patient taking  differently: Take 40 mg by mouth daily.), Disp: 30 tablet, Rfl: 0   gabapentin  (NEURONTIN ) 300 MG capsule, Take 600 mg by mouth at bedtime., Disp: , Rfl:    gabapentin  (NEURONTIN ) 400 MG capsule, Take 1 capsule (400 mg total) by mouth daily., Disp: 30 capsule, Rfl: 0   insulin  aspart (NOVOLOG  FLEXPEN) 100 UNIT/ML FlexPen, Max daily 30 units, Disp: 30 mL, Rfl: 3   insulin  degludec (TRESIBA  FLEXTOUCH) 100 UNIT/ML FlexTouch Pen, Inject 10 Units into the skin daily., Disp: 15 mL, Rfl: 3   Insulin  Pen Needle 32G X 4 MM MISC, 1 Device by Does not apply route in the morning, at noon, in the evening, and at bedtime., Disp: 400 each, Rfl: 3   isosorbide  mononitrate (IMDUR ) 60 MG 24 hr tablet, Take 1 tablet (60 mg total) by mouth daily., Disp: 90 tablet, Rfl: 3   Magnesium  400 MG CAPS, Take 400 mg by mouth at bedtime., Disp: , Rfl:    metFORMIN  (GLUCOPHAGE ) 1000 MG tablet, Take 1 tablet (1,000 mg total) by mouth 2 (two) times daily., Disp: , Rfl:    metoprolol  succinate (TOPROL -XL) 25 MG 24 hr tablet, Take 12.5 mg by mouth daily., Disp: , Rfl:    nitroGLYCERIN  (NITROSTAT ) 0.4 MG SL tablet, Place 0.4 mg under the tongue every 5 (five) minutes x 3 doses as needed for chest pain. , Disp: , Rfl:    Omega-3 Fatty Acids (FISH OIL) 500 MG CAPS, Take 500 mg by mouth daily., Disp: , Rfl:    pantoprazole  (PROTONIX ) 40 MG tablet, Take 1 tablet (40 mg total) by mouth 2 (two) times daily., Disp: 60 tablet, Rfl: 0   pioglitazone  (ACTOS ) 30 MG tablet, Take 1 tablet (30 mg total) by mouth daily.,  Disp: 90 tablet, Rfl: 3   Potassium Chloride  ER 20 MEQ TBCR, Take 1 tablet (20 mEq total) by mouth daily., Disp: 90 tablet, Rfl: 3   ranolazine (RANEXA) 500 MG 12 hr tablet, Take 1 tablet (500 mg total) by mouth 2 (two) times daily., Disp: 60 tablet, Rfl: 2   rosuvastatin  (CRESTOR ) 20 MG tablet, Take 1 tablet (20 mg total) by mouth daily., Disp: 90 tablet, Rfl: 3   sacubitril -valsartan  (ENTRESTO ) 24-26 MG, Take 1 tablet by mouth 2  (two) times daily., Disp: 180 tablet, Rfl: 3   senna-docusate (SENOKOT-S) 8.6-50 MG tablet, Take 1 tablet by mouth at bedtime. (Patient taking differently: Take 1 tablet by mouth 2 (two) times daily.), Disp: 30 tablet, Rfl: 2   tamsulosin  (FLOMAX ) 0.4 MG CAPS capsule, Take 1 capsule (0.4 mg total) by mouth daily after supper. (Patient taking differently: Take 0.4 mg by mouth in the morning and at bedtime.), Disp: 30 capsule, Rfl: 0   Turmeric (QC TUMERIC COMPLEX PO), Take 500 mg by mouth at bedtime., Disp: , Rfl:    Meds ordered this encounter  Medications   ranolazine (RANEXA) 500 MG 12 hr tablet    Sig: Take 1 tablet (500 mg total) by mouth 2 (two) times daily.    Dispense:  60 tablet    Refill:  2     Medications Discontinued During This Encounter  Medication Reason   acetaminophen  (TYLENOL ) 500 MG tablet Discontinued by provider   clotrimazole-betamethasone  (LOTRISONE) cream Discontinued by provider   PRESCRIPTION MEDICATION Discontinued by provider   terazosin  (HYTRIN ) 5 MG capsule Discontinued by provider   traMADol  (ULTRAM ) 50 MG tablet Discontinued by provider     ASSESSMENT AND PLAN: .      ICD-10-CM   1. Dizziness and giddiness  R42     2. Coronary artery disease involving native coronary artery of native heart without angina pectoris  I25.10 EKG 12-Lead    ranolazine (RANEXA) 500 MG 12 hr tablet    3. PAD (peripheral artery disease) (HCC)  I73.9 EKG 12-Lead    4. Mixed hyperlipidemia  E78.2 EKG 12-Lead    5. Chews tobacco  Z72.0       Assessment and Plan Assessment & Plan Coronary artery disease with multiple blockages Severe coronary artery disease with multiple blockages. Not a candidate for balloon angioplasty or stenting. Medical management is the primary approach. Limited intervention options and prognosis discussed with him. - Continue current medical therapy. - Consider DNR status due to limited intervention options and prognosis.  Chest pain Chest pain  occurred after stopping metoprolol , suggesting a correlation between metoprolol  cessation and chest pain onset. Metoprolol  may help alleviate chest pain. Ranexa (ranolazine) is introduced to manage chest pain without affecting blood pressure. - Start Ranexa (ranolazine) twice daily to manage chest pain without affecting blood pressure. - Stop metoprolol  two days after starting Ranexa. - If chest pain recurs, resume metoprolol  and consider reducing Entresto  dose from 24/26 mg twice daily to 1/2 tablet twice daily or even discontinue this in view of low blood pressure.  Heart failure with reduced ejection fraction Heart failure with reduced ejection fraction managed with Entresto  and metoprolol . Balancing heart failure management with symptom control is challenging due to low blood pressure and dizziness. Adjustments to medication may be necessary to improve quality of life. - Continue Entresto  at current dose. - Consider reducing Entresto  dose if dizziness persists after stopping metoprolol .  Dizziness due to medication Dizziness associated with low blood pressure, likely due  to metoprolol . Dizziness resolved upon stopping metoprolol  but returned when metoprolol  was resumed at a lower dose. Balancing symptom control with medication management is challenging due to low blood pressure. - Continue metoprolol  at current dose of 12.5 mg. - Monitor for dizziness and adjust medication as needed.  Tobacco use Long-term tobacco use through chewing. He is not interested in cessation.  Goals of Care He prefers a natural death without resuscitation efforts in the event of cardiac arrest. He expressed a desire not to be placed on life support or undergo CPR, aligning with his values and understanding of his prognosis. - Consider documenting DNR status in the chart after further discussion with him and family.  This was a 40-minute office visit encounter, patient new to me, extensive review of his charts,  personal review of his coronary angiogram, patient's end-of-life discussions with the daughter.  All questions answered.  I would like to see him back in 3 months for follow-up.     Signed,  Knox Perl, MD, [facility] 02/24/2024, 2:21 PM [facility] [address] [place], [place] [address] Phone: [phone]. Fax:  [phone]

## 2024-02-24 NOTE — Patient Instructions (Signed)
 Medication Instructions:  Start Ranexa 500 mg by mouth twice daily  *If you need a refill on your cardiac medications before your next appointment, please call your pharmacy*  Lab Work: none If you have labs (blood work) drawn today and your tests are completely normal, you will receive your results only by: MyChart Message (if you have MyChart) OR A paper copy in the mail If you have any lab test that is abnormal or we need to change your treatment, we will call you to review the results.  Testing/Procedures: none  Follow-Up: At [facility], you and your health needs are our priority.  As part of our continuing mission to provide you with exceptional heart care, our providers are all part of one team.  This team includes your primary Cardiologist (physician) and Advanced Practice Providers or APPs (Physician Assistants and Nurse Practitioners) who all work together to provide you with the care you need, when you need it.  Your next appointment:   3 month(s)  Provider:   Knox Perl, MD or Dr Renna Cary   We recommend signing up for the patient portal called "MyChart".  Sign up information is provided on this After Visit Summary.  MyChart is used to connect with patients for Virtual Visits (Telemedicine).  Patients are able to view lab/test results, encounter notes, upcoming appointments, etc.  Non-urgent messages can be sent to your provider as well.   To learn more about what you can do with MyChart, go to [email or web address].   Other Instructions

## 2024-02-26 ENCOUNTER — Other Ambulatory Visit: Payer: Self-pay | Admitting: Internal Medicine

## 2024-03-02 ENCOUNTER — Encounter: Payer: Self-pay | Admitting: Cardiology

## 2024-03-02 ENCOUNTER — Telehealth: Payer: Self-pay | Admitting: Cardiology

## 2024-03-02 DIAGNOSIS — I5022 Chronic systolic (congestive) heart failure: Secondary | ICD-10-CM | POA: Insufficient documentation

## 2024-03-02 NOTE — Telephone Encounter (Signed)
 Patient  brought in paper work that needs to be filled out. Will put in provider box

## 2024-03-02 NOTE — Telephone Encounter (Signed)
 Form completed by Dr Berry Bristol.  Patient's daughter notified.  She will pick up today.  Form left at desk at entrance to coumadin clinic

## 2024-03-03 DIAGNOSIS — I2510 Atherosclerotic heart disease of native coronary artery without angina pectoris: Secondary | ICD-10-CM | POA: Diagnosis not present

## 2024-03-03 DIAGNOSIS — N1831 Chronic kidney disease, stage 3a: Secondary | ICD-10-CM | POA: Diagnosis not present

## 2024-03-03 DIAGNOSIS — I480 Paroxysmal atrial fibrillation: Secondary | ICD-10-CM | POA: Diagnosis not present

## 2024-03-03 DIAGNOSIS — I5043 Acute on chronic combined systolic (congestive) and diastolic (congestive) heart failure: Secondary | ICD-10-CM | POA: Diagnosis not present

## 2024-03-06 DIAGNOSIS — H8111 Benign paroxysmal vertigo, right ear: Secondary | ICD-10-CM | POA: Diagnosis not present

## 2024-03-07 ENCOUNTER — Telehealth: Payer: Self-pay

## 2024-03-07 NOTE — Telephone Encounter (Signed)
 Completed for has been placed up front and patient is aware

## 2024-03-08 DIAGNOSIS — H353211 Exudative age-related macular degeneration, right eye, with active choroidal neovascularization: Secondary | ICD-10-CM | POA: Diagnosis not present

## 2024-03-08 DIAGNOSIS — H353133 Nonexudative age-related macular degeneration, bilateral, advanced atrophic without subfoveal involvement: Secondary | ICD-10-CM | POA: Diagnosis not present

## 2024-03-08 DIAGNOSIS — H353221 Exudative age-related macular degeneration, left eye, with active choroidal neovascularization: Secondary | ICD-10-CM | POA: Diagnosis not present

## 2024-03-08 DIAGNOSIS — E113393 Type 2 diabetes mellitus with moderate nonproliferative diabetic retinopathy without macular edema, bilateral: Secondary | ICD-10-CM | POA: Diagnosis not present

## 2024-03-10 DIAGNOSIS — I480 Paroxysmal atrial fibrillation: Secondary | ICD-10-CM | POA: Diagnosis not present

## 2024-03-10 DIAGNOSIS — N1831 Chronic kidney disease, stage 3a: Secondary | ICD-10-CM | POA: Diagnosis not present

## 2024-03-10 DIAGNOSIS — I5043 Acute on chronic combined systolic (congestive) and diastolic (congestive) heart failure: Secondary | ICD-10-CM | POA: Diagnosis not present

## 2024-03-20 ENCOUNTER — Other Ambulatory Visit: Payer: Self-pay

## 2024-03-20 DIAGNOSIS — H353221 Exudative age-related macular degeneration, left eye, with active choroidal neovascularization: Secondary | ICD-10-CM | POA: Diagnosis not present

## 2024-03-20 DIAGNOSIS — H353211 Exudative age-related macular degeneration, right eye, with active choroidal neovascularization: Secondary | ICD-10-CM | POA: Diagnosis not present

## 2024-03-20 DIAGNOSIS — I2510 Atherosclerotic heart disease of native coronary artery without angina pectoris: Secondary | ICD-10-CM

## 2024-03-20 DIAGNOSIS — H353133 Nonexudative age-related macular degeneration, bilateral, advanced atrophic without subfoveal involvement: Secondary | ICD-10-CM | POA: Diagnosis not present

## 2024-03-20 DIAGNOSIS — E113393 Type 2 diabetes mellitus with moderate nonproliferative diabetic retinopathy without macular edema, bilateral: Secondary | ICD-10-CM | POA: Diagnosis not present

## 2024-03-20 MED ORDER — RANOLAZINE ER 500 MG PO TB12: 500.0000 mg | ORAL_TABLET | Freq: Two times a day (BID) | ORAL | 3 refills | Status: DC

## 2024-03-26 NOTE — Telephone Encounter (Signed)
 NA

## 2024-04-02 DIAGNOSIS — I5043 Acute on chronic combined systolic (congestive) and diastolic (congestive) heart failure: Secondary | ICD-10-CM | POA: Diagnosis not present

## 2024-04-02 DIAGNOSIS — N1831 Chronic kidney disease, stage 3a: Secondary | ICD-10-CM | POA: Diagnosis not present

## 2024-04-02 DIAGNOSIS — E1342 Other specified diabetes mellitus with diabetic polyneuropathy: Secondary | ICD-10-CM | POA: Diagnosis not present

## 2024-04-02 DIAGNOSIS — I480 Paroxysmal atrial fibrillation: Secondary | ICD-10-CM | POA: Diagnosis not present

## 2024-04-09 ENCOUNTER — Telehealth: Payer: Self-pay | Admitting: Cardiology

## 2024-04-09 DIAGNOSIS — E1342 Other specified diabetes mellitus with diabetic polyneuropathy: Secondary | ICD-10-CM | POA: Diagnosis not present

## 2024-04-09 DIAGNOSIS — I480 Paroxysmal atrial fibrillation: Secondary | ICD-10-CM | POA: Diagnosis not present

## 2024-04-09 DIAGNOSIS — I5043 Acute on chronic combined systolic (congestive) and diastolic (congestive) heart failure: Secondary | ICD-10-CM | POA: Diagnosis not present

## 2024-04-09 DIAGNOSIS — N1831 Chronic kidney disease, stage 3a: Secondary | ICD-10-CM | POA: Diagnosis not present

## 2024-04-09 NOTE — Telephone Encounter (Signed)
 Pt c/o medication issue:  1. Name of Medication: ranolazine  (RANEXA ) 500 MG 12 hr tablet   2. How are you currently taking this medication (dosage and times per day)? As written  3. Are you having a reaction (difficulty breathing--STAT)? No   4. What is your medication issue? Nauseated, dry heaves, fatigue, dizzy

## 2024-04-09 NOTE — Telephone Encounter (Signed)
 Spoke with Keith Espinoza. Stated they would try 1/2 dose twice daily and then stop medication all together if no improvement is seen before next appointment.

## 2024-04-09 NOTE — Telephone Encounter (Signed)
 He can stop the medication or try 1/2 twice daily, his BP is very low and hence cannot Rx anything else. I will see him in the office and will see if we can do anything different

## 2024-04-09 NOTE — Telephone Encounter (Signed)
 Spoke with Pt's daughter. States that pt has had dizziness, very tired, and nauseous since starting this medication..Hr and BP has been running good but does not have exact readings. No chest pain. Requesting change in treatment. Advised we would forward to Dr Ladona and PharmD.

## 2024-04-13 ENCOUNTER — Telehealth: Payer: Self-pay | Admitting: Cardiology

## 2024-04-13 NOTE — Telephone Encounter (Signed)
 Paperwork is duplicate of form completed on 5/30.  It is scanned in chart.  I spoke with patient's daughter and she has this paperwork  from 5/30 and does not need paperwork completed again

## 2024-04-13 NOTE — Telephone Encounter (Signed)
 Patient wife dropped off document for medical records. Scanned and sent  04/13/2024.

## 2024-04-13 NOTE — Telephone Encounter (Signed)
 Paper Work Dropped Off: In front office   Date:04/13/2024  Location of paper: In [place] box

## 2024-04-16 ENCOUNTER — Other Ambulatory Visit: Payer: Self-pay | Admitting: *Deleted

## 2024-04-16 ENCOUNTER — Other Ambulatory Visit: Payer: Self-pay

## 2024-04-16 MED ORDER — NITROGLYCERIN 0.4 MG SL SUBL
0.4000 mg | SUBLINGUAL_TABLET | SUBLINGUAL | 6 refills | Status: AC | PRN
Start: 2024-04-16 — End: ?

## 2024-04-16 NOTE — Progress Notes (Signed)
 NTG refill sent to [facility] in [place]

## 2024-05-02 ENCOUNTER — Ambulatory Visit: Admitting: Internal Medicine

## 2024-05-10 NOTE — Progress Notes (Deleted)
 Name: Keith Espinoza  Age/ Sex: 83 y.o., male   MRN/ DOB: [id number], [birth year]     PCP: Charlott Dorn LABOR, MD   Reason for Endocrinology Evaluation: Type 2 Diabetes Mellitus  Initial Endocrine Consultative Visit: 03/24/2021    PATIENT IDENTIFIER: Keith Espinoza is a 83 y.o. male with a past medical history of Right BK amputations due  PVD, T2DM and Pancreatitis .SABRA The patient has followed with Endocrinology clinic since 03/24/2021 for consultative assistance with management of his diabetes.  DIABETIC HISTORY:  Keith Espinoza was diagnosed with DM 2015, Trulicity caused weight loss, Has been on Glimepiride  in the past. His hemoglobin A1c has ranged from 6.6% in 2020, peaking at 7.7% in 2022.     Transitioned care from Dr. Luana in 2022    On his initial visit to our clinic he had an A1c 7.5 % He was on jardiance , Metformin  and Basal insulin , which I have reduced basal insulin    Started pioglitazoe 10/2021  Patient was diagnosed with DKA 06/2023 with elevated beta hydroxybutyrate at 5.1   ADRENAL HISTORY : Pt found to have a 1 cm left adrenal nodule on CT imaging of abdomen on 01/31/2021. Of note, the pt was noted to have hypokalemia , this is chronic and has been on KCL for ~ 5 yrs      Aldo, renin, 24-hr urinary cortisol and catecholamine have all come back normal 03/2021  He was noted with slight elevation of plasma normetanephrine and metanephrine 2023.  24-hour urine cortisol normal 04/2023.  His metanephrine 24-hour urine was canceled at the lab   SUBJECTIVE:   During the last visit (12/30/2023): A1c 7.7%     Today (05/10/2024): Keith Espinoza is here for a follow up on diabetes management.  He is accompanied by his grand daughter . He has been out of dexcom for ~ 1 month, as the DME supplier needs an updated office visit within 3 months.  He attempted to use a glucose meter at home but the patient states it broke.  Patient continues to follow-up with  cardiology for aortic stenosis, HTN, and dyslipidemia He continues to follow-up with ophthalmology for macular degeneration  He did undergo surgery for phimosis January, 2025 He is s/p circumcision 10/12/2023  Denies nausea, vomiting Denies constipation or diarrhea    HOME DIABETES REGIMEN:  Tresiba  10 units daily  Metformin  1000 mg Twice a day  Jardiance  25 mg daily Pioglitazone  30 mg daily  CF: Novolog  (BG-130/45) TIDQAC     Statin: Intolerant to Simvastatin due to severe myalgia  ACE-I/ARB: yes      CONTINUOUS GLUCOSE MONITORING RECORD INTERPRETATION: n/a   DIABETIC COMPLICATIONS: Microvascular complications:  Neuropathy, macular degeneration Denies: CKD Last Eye Exam: Completed 04/15/2022  Macrovascular complications:  CAD, PVD Denies: CVA   HISTORY:  Past Medical History:  Past Medical History:  Diagnosis Date   Allergic rhinitis    Anemia    Aortic stenosis    Arthritis    Basal cell carcinoma 11/01/2019    bcc left chest treatment TX cx3 51fu    CHF (congestive heart failure) (HCC)    Chronic leg pain    right   Chronic lower back pain    Coronary artery disease    a. Stenting to RCA 2004; staged DES to LAD and Cx 2004. DES to [facility] 2012. b. DES to mCx, PTCA to dCx 11/2011. c. Lateral wall MI 2013 s/p PTCA to distal Cx & DES to mid OM2 11/2011. d.  Low risk nuc 04/2014, EF wnl.   COVID-19    Diabetes mellitus    Insulin  dependent   Diabetic neuropathy (HCC)    MILD   Diverticulosis    Dysrhythmia    Bertrum syndrome    Gout    right wrist; right foot; right elbow; have had it since 1970's   H/O hiatal hernia    Heart murmur    History of echocardiogram    aortic sclerosis per echo 12/09 EF 65%, otherwise normal   History of hemorrhoids    BLEEDING   History of kidney stones    h/o   Hypertension    Diagnosed 1995    Myocardial infarction [facility])    Pancreatic pseudocyst    a. s/p remote drainage 2006.   Thrombocytopenia (HCC)    Seen on  oldest labs in system from 2004   Vitamin B 12 deficiency    orally replaced   Past Surgical History:  Past Surgical History:  Procedure Laterality Date   ABDOMINAL AORTOGRAM W/LOWER EXTREMITY Bilateral 08/08/2020   Procedure: ABDOMINAL AORTOGRAM W/LOWER EXTREMITY;  Surgeon: Eliza Lonni RAMAN, MD;  Location: [facility] INVASIVE CV LAB;  Service: Cardiovascular;  Laterality: Bilateral;   AMPUTATION Right 12/12/2020   Procedure: RIGHT BELOW KNEE AMPUTATION;  Surgeon: Harden Jerona GAILS, MD;  Location: [facility] OR;  Service: Orthopedics;  Laterality: Right;   BACK SURGERY     total of 3 times S/P fall    CARPAL TUNNEL RELEASE Bilateral    CHOLECYSTECTOMY  1990's   CIRCUMCISION N/A 10/12/2023   Procedure: Dorsal slit;  Surgeon: [name], MD;  Location: [facility] ORS;  Service: Urology;  Laterality: N/A;   COLONOSCOPY     COLONOSCOPY Left 07/08/2023   Procedure: COLONOSCOPY;  Surgeon: San Sandor GAILS, DO;  Location: [facility] ENDOSCOPY;  Service: Gastroenterology;  Laterality: Left;   CORONARY ANGIOPLASTY  11/11/11   CORONARY ANGIOPLASTY WITH STENT PLACEMENT  09/30/2011   1 then; makes a total of 4   CORONARY ANGIOPLASTY WITH STENT PLACEMENT  11/11/11   1; makes a total of 5   ESOPHAGOGASTRODUODENOSCOPY Left 07/08/2023   Procedure: ESOPHAGOGASTRODUODENOSCOPY (EGD);  Surgeon: San Sandor GAILS, DO;  Location: [facility] ENDOSCOPY;  Service: Gastroenterology;  Laterality: Left;   HEMOSTASIS CLIP PLACEMENT  07/08/2023   Procedure: HEMOSTASIS CLIP PLACEMENT;  Surgeon: San Sandor GAILS, DO;  Location: [facility] ENDOSCOPY;  Service: Gastroenterology;;   HOT HEMOSTASIS N/A 07/08/2023   Procedure: HOT HEMOSTASIS (ARGON PLASMA COAGULATION/BICAP);  Surgeon: San Sandor GAILS, DO;  Location: [facility] ENDOSCOPY;  Service: Gastroenterology;  Laterality: N/A;   INGUINAL HERNIA REPAIR  2003   right   JOINT REPLACEMENT Right 04/03/2002   hip replacment   KNEE ARTHROSCOPY  1990's   left   LEFT HEART CATH AND CORONARY ANGIOGRAPHY N/A  04/25/2023   Procedure: LEFT HEART CATH AND CORONARY ANGIOGRAPHY;  Surgeon: Anner Alm ORN, MD;  Location: [facility] INVASIVE CV LAB;  Service: Cardiovascular;  Laterality: N/A;   LEFT HEART CATHETERIZATION WITH CORONARY ANGIOGRAM N/A 09/30/2011   Procedure: LEFT HEART CATHETERIZATION WITH CORONARY ANGIOGRAM;  Surgeon: Candyce RAMAN Reek, MD;  Location: [facility] CATH LAB;  Service: Cardiovascular;  Laterality: N/A;  possible PCI   LEFT HEART CATHETERIZATION WITH CORONARY ANGIOGRAM N/A 11/15/2011   Procedure: LEFT HEART CATHETERIZATION WITH CORONARY ANGIOGRAM;  Surgeon: Candyce RAMAN Reek, MD;  Location: [facility] CATH LAB;  Service: Cardiovascular;  Laterality: N/A;   PERCUTANEOUS CORONARY STENT INTERVENTION (PCI-S)  09/30/2011   Procedure: PERCUTANEOUS CORONARY STENT INTERVENTION (PCI-S);  Surgeon:  Candyce GORMAN Reek, MD;  Location: [facility] CATH LAB;  Service: Cardiovascular;;   PERCUTANEOUS CORONARY STENT INTERVENTION (PCI-S) N/A 11/11/2011   Procedure: PERCUTANEOUS CORONARY STENT INTERVENTION (PCI-S);  Surgeon: Candyce GORMAN Reek, MD;  Location: [facility] CATH LAB;  Service: Cardiovascular;  Laterality: N/A;   PERIPHERAL VASCULAR BALLOON ANGIOPLASTY Right 08/08/2020   Procedure: PERIPHERAL VASCULAR BALLOON ANGIOPLASTY;  Surgeon: Eliza Lonni GORMAN, MD;  Location: [facility] INVASIVE CV LAB;  Service: Cardiovascular;  Laterality: Right;  Posterior tibial    SHOULDER SURGERY Right    X 2   STUMP REVISION Right 01/09/2021   Procedure: REVISION RIGHT BELOW KNEE AMPUTATION;  Surgeon: Harden Jerona GAILS, MD;  Location: [facility] OR;  Service: Orthopedics;  Laterality: Right;   TONSILLECTOMY  ~ 1948   TOTAL HIP REVISION Right 04/13/2019   Procedure: RIGHT TOTAL HIP REVISION-POSTERIOR  APPROACH LATERAL;  Surgeon: Barbarann Oneil BROCKS, MD;  Location: [facility] OR;  Service: Orthopedics;  Laterality: Right;   TOTAL KNEE ARTHROPLASTY Left 07/23/2016   Procedure: LEFT TOTAL KNEE ARTHROPLASTY;  Surgeon: Oneil BROCKS Barbarann, MD;  Location: [facility] OR;  Service: Orthopedics;   Laterality: Left;   Social History:  reports that he has quit smoking. His smoking use included cigarettes. His smokeless tobacco use includes chew. He reports that he does not drink alcohol and does not use drugs. Family History:  Family History  Problem Relation Age of Onset   Diabetes Mother    Hyperlipidemia Mother    Hypertension Mother    Cancer Father    Hypertension Father    Diabetes Sister    Hypertension Sister    Cancer Brother    Heart attack Neg Hx      HOME MEDICATIONS: Allergies as of 05/11/2024       Reactions   Simvastatin Other (See Comments)   SEVERE MYALGIAS   Zetia [ezetimibe] Other (See Comments)   MYALGIAS   Dilaudid  [hydromorphone  Hcl] Other (See Comments)   hallucination        Medication List        Accurate as of May 10, 2024  1:02 PM. If you have any questions, ask your nurse or doctor.          Acetaminophen  Extra Strength 500 MG Tabs Take 2 tablets (1,000 mg total) by mouth every 6 (six) hours. What changed:  when to take this reasons to take this   allopurinol  300 MG tablet Commonly known as: ZYLOPRIM  Take 300 mg by mouth daily.   aspirin  EC 81 MG tablet Take 81 mg by mouth daily. Swallow whole.   B-12 1000 MCG Tabs Take 1,000 mcg by mouth daily.   clopidogrel  75 MG tablet Commonly known as: PLAVIX  Take 1 tablet (75 mg total) by mouth daily.   Dexcom G7 Sensor Misc 1 Device by Does not apply route as directed.   empagliflozin  25 MG Tabs tablet Commonly known as: Jardiance  TAKE 1 TABLET(25 MG) BY MOUTH DAILY   Fish Oil 500 MG Caps Take 500 mg by mouth daily.   furosemide  20 MG tablet Commonly known as: LASIX  Take 1 tablet (20 mg total) by mouth daily. What changed: how much to take   gabapentin  300 MG capsule Commonly known as: NEURONTIN  Take 600 mg by mouth at bedtime.   gabapentin  400 MG capsule Commonly known as: NEURONTIN  Take 1 capsule (400 mg total) by mouth daily.   Insulin  Pen Needle 32G X 4 MM  Misc 1 Device by Does not apply route in the morning, at noon, in  the evening, and at bedtime.   isosorbide  mononitrate 60 MG 24 hr tablet Commonly known as: IMDUR  Take 1 tablet (60 mg total) by mouth daily.   Magnesium  400 MG Caps Take 400 mg by mouth at bedtime.   metFORMIN  1000 MG tablet Commonly known as: GLUCOPHAGE  Take 1 tablet (1,000 mg total) by mouth 2 (two) times daily.   metoprolol  succinate 25 MG 24 hr tablet Commonly known as: TOPROL -XL Take 12.5 mg by mouth daily.   nitroGLYCERIN  0.4 MG SL tablet Commonly known as: NITROSTAT  Place 1 tablet (0.4 mg total) under the tongue every 5 (five) minutes x 3 doses as needed for chest pain.   NovoLOG  FlexPen 100 UNIT/ML FlexPen Generic drug: insulin  aspart Max daily 30 units   pantoprazole  40 MG tablet Commonly known as: PROTONIX  Take 1 tablet (40 mg total) by mouth 2 (two) times daily.   pioglitazone  30 MG tablet Commonly known as: ACTOS  Take 1 tablet (30 mg total) by mouth daily.   Potassium Chloride  ER 20 MEQ Tbcr Take 1 tablet (20 mEq total) by mouth daily.   QC TUMERIC COMPLEX PO Take 500 mg by mouth at bedtime.   ranolazine  500 MG 12 hr tablet Commonly known as: Ranexa  Take 1 tablet (500 mg total) by mouth 2 (two) times daily.   rosuvastatin  20 MG tablet Commonly known as: CRESTOR  Take 1 tablet (20 mg total) by mouth daily.   sacubitril -valsartan  24-26 MG Commonly known as: ENTRESTO  Take 1 tablet by mouth 2 (two) times daily.   senna-docusate 8.6-50 MG tablet Commonly known as: Senokot-S Take 1 tablet by mouth at bedtime. What changed: when to take this   tamsulosin  0.4 MG Caps capsule Commonly known as: FLOMAX  Take 1 capsule (0.4 mg total) by mouth daily after supper. What changed: when to take this   Tresiba  FlexTouch 100 UNIT/ML FlexTouch Pen Generic drug: insulin  degludec Inject 10 Units into the skin daily.   VITAMIN C PO Take 1,000 mcg by mouth at bedtime.   Vitamin D3 50 MCG (2000  UT) Tabs Take 2,000 Units by mouth at bedtime.         OBJECTIVE:   Vital Signs:There were no vitals taken for this visit.   Wt Readings from Last 3 Encounters:  02/24/24 157 lb 6.4 oz (71.4 kg)  12/30/23 160 lb 6.4 oz (72.8 kg)  12/19/23 166 lb (75.3 kg)     Exam: General: Pt appears well and is in NAD  Neck: General: Supple without adenopathy. Thyroid : Thyroid  size normal.  No goiter or nodules appreciated.   Lungs: Clear with good BS bilat   Heart: RRR  Neuro: MS is good with appropriate affect, pt is alert and Ox3     DATA REVIEWED:  Lab Results  Component Value Date   HGBA1C 7.7 (A) 12/30/2023   HGBA1C 9.0 (H) 09/29/2023   HGBA1C 7.1 (A) 04/01/2023    Latest Reference Range & Units 09/29/23 10:18  Sodium 135 - 145 mmol/L 137  Potassium 3.5 - 5.1 mmol/L 3.5  Chloride 98 - 111 mmol/L 100  CO2 22 - 32 mmol/L 26  Glucose 70 - 99 mg/dL 741 (H)  Mean Plasma Glucose mg/dL 787  BUN 8 - 23 mg/dL 24 (H)  Creatinine 9.38 - 1.24 mg/dL 8.86  Calcium  8.9 - 10.3 mg/dL 9.7  Anion gap 5 - 15  11  GFR, Estimated >60 mL/min >60  (H): Data is abnormally high  CT abdomen 01/31/2021   Indeterminate 10 mm left adrenal nodule,  likely adenoma.     MRI abdomen 12/2022 Adrenals/Urinary tract: 1.9 x 1.3 cm left adrenal mass is seen which shows signal dropout on chemical shift imaging, consistent with a benign adrenal adenoma. This is also stable compared to previous CT. No suspicious renal masses identified. No evidence of hydronephrosis.    ASSESSMENT / PLAN / RECOMMENDATIONS:   1) Type 2 Diabetes Mellitus, suboptimally controlled, With neuropathic and macrovascular  complications - Most recent A1c of 7.7 %. Goal A1c < 7.5 %.    - A1c has trended down from 9.0% to 7.7% - NO CGM data t as he has been out of it due to insurance requirements - Pt with hx of pancreatitis , hence DPP-4 inhibitors and GLP-1 agonists are contra-indicated.  -No changes today, patient was  encouraged to continue to use NovoLog  based on correction scale before each meal    MEDICATIONS: Continue Tresiba   10 units daily  Continue Metformin  1000 mg Twice a day  Continue Jardiance  25 mg daily Continue pioglitazone  30 mg daily  Continue correction factor: NovoLog  (BG -130/45) with each meal    EDUCATION / INSTRUCTIONS: BG monitoring instructions: Patient is instructed to check his blood sugars 1 times a day, fasting . Call [facility] if: BG persistently < 70  I reviewed the Rule of 15 for the treatment of hypoglycemia in detail with the patient. Literature supplied.    2) Diabetic complications:  Eye:  Unknown to have diabetic retinopathy.  Neuro/ Feet: Does  have known diabetic peripheral neuropathy .  Renal: Patient does not have known baseline CKD. He   is  on an ACEI/ARB at present.    3) Left Adrenal Adenoma:   - Hormonal workup has ruled out cushing syndrome (2022), Pheo and hyperaldo 06/2022 .Aldo, renin, and 24-hour urinary cortisol were normal in 2024 . -He was noted with slight elevation of plasma normetanephrine, but unfortunately his 24-hour urine sample was canceled through lab, he did not resubmit the sample in July, 2024- Repeat adrenal imaging showed stability 12/2022 - no further imaging recommended     F/U in 4 months      Signed electronically by: Stefano Redgie Butts, MD  [facility]  [facility] [address] [place], [place] [address] Phone: [phone] FAX: [phone]   CC: Charlott Dorn LABOR, MD [address] [place] [address] Phone: [phone]  Fax: [phone]  Return to Endocrinology clinic as below: Future Appointments  Date Time Provider Department Center  05/11/2024 11:10 AM Ory Elting, Donell Redgie, MD [facility] None  05/25/2024  4:00 PM [name], MD [facility]  06/22/2024  9:15 AM [facility] 3 [facility]

## 2024-05-11 ENCOUNTER — Ambulatory Visit: Admitting: Internal Medicine

## 2024-05-12 ENCOUNTER — Encounter (HOSPITAL_BASED_OUTPATIENT_CLINIC_OR_DEPARTMENT_OTHER): Payer: Self-pay

## 2024-05-12 ENCOUNTER — Other Ambulatory Visit: Payer: Self-pay

## 2024-05-12 ENCOUNTER — Emergency Department (HOSPITAL_BASED_OUTPATIENT_CLINIC_OR_DEPARTMENT_OTHER)
Admission: EM | Admit: 2024-05-12 | Discharge: 2024-05-12 | Disposition: A | Discharge status: Home / Self Care | Attending: Emergency Medicine | Admitting: Emergency Medicine

## 2024-05-12 DIAGNOSIS — Z79899 Other long term (current) drug therapy: Secondary | ICD-10-CM | POA: Diagnosis not present

## 2024-05-12 DIAGNOSIS — R42 Dizziness and giddiness: Secondary | ICD-10-CM | POA: Diagnosis not present

## 2024-05-12 DIAGNOSIS — R7989 Other specified abnormal findings of blood chemistry: Secondary | ICD-10-CM

## 2024-05-12 DIAGNOSIS — E876 Hypokalemia: Secondary | ICD-10-CM | POA: Diagnosis not present

## 2024-05-12 DIAGNOSIS — I2510 Atherosclerotic heart disease of native coronary artery without angina pectoris: Secondary | ICD-10-CM | POA: Insufficient documentation

## 2024-05-12 DIAGNOSIS — Z7902 Long term (current) use of antithrombotics/antiplatelets: Secondary | ICD-10-CM | POA: Insufficient documentation

## 2024-05-12 DIAGNOSIS — Z7984 Long term (current) use of oral hypoglycemic drugs: Secondary | ICD-10-CM | POA: Diagnosis not present

## 2024-05-12 DIAGNOSIS — Z7982 Long term (current) use of aspirin: Secondary | ICD-10-CM | POA: Insufficient documentation

## 2024-05-12 DIAGNOSIS — I110 Hypertensive heart disease with heart failure: Secondary | ICD-10-CM | POA: Insufficient documentation

## 2024-05-12 DIAGNOSIS — E119 Type 2 diabetes mellitus without complications: Secondary | ICD-10-CM | POA: Diagnosis not present

## 2024-05-12 DIAGNOSIS — Z794 Long term (current) use of insulin: Secondary | ICD-10-CM | POA: Diagnosis not present

## 2024-05-12 DIAGNOSIS — I509 Heart failure, unspecified: Secondary | ICD-10-CM | POA: Diagnosis not present

## 2024-05-12 DIAGNOSIS — Z85828 Personal history of other malignant neoplasm of skin: Secondary | ICD-10-CM | POA: Diagnosis not present

## 2024-05-12 DIAGNOSIS — Z8616 Personal history of COVID-19: Secondary | ICD-10-CM | POA: Insufficient documentation

## 2024-05-12 LAB — CBC WITH DIFFERENTIAL/PLATELET
Abs Immature Granulocytes: 0.08 K/uL — ABNORMAL HIGH (ref 0.00–0.07)
Basophils Absolute: 0 K/uL (ref 0.0–0.1)
Basophils Relative: 0 %
Eosinophils Absolute: 0.2 K/uL (ref 0.0–0.5)
Eosinophils Relative: 3 %
HCT: 30.2 % — ABNORMAL LOW (ref 39.0–52.0)
Hemoglobin: 10.4 g/dL — ABNORMAL LOW (ref 13.0–17.0)
Immature Granulocytes: 1 %
Lymphocytes Relative: 14 %
Lymphs Abs: 1 K/uL (ref 0.7–4.0)
MCH: 33.3 pg (ref 26.0–34.0)
MCHC: 34.4 g/dL (ref 30.0–36.0)
MCV: 96.8 fL (ref 80.0–100.0)
Monocytes Absolute: 0.4 K/uL (ref 0.1–1.0)
Monocytes Relative: 5 %
Neutro Abs: 5.3 K/uL (ref 1.7–7.7)
Neutrophils Relative %: 77 %
Platelets: 147 K/uL — ABNORMAL LOW (ref 150–400)
RBC: 3.12 MIL/uL — ABNORMAL LOW (ref 4.22–5.81)
RDW: 15.9 % — ABNORMAL HIGH (ref 11.5–15.5)
WBC: 6.9 K/uL (ref 4.0–10.5)
nRBC: 0 % (ref 0.0–0.2)

## 2024-05-12 LAB — URINALYSIS, W/ REFLEX TO CULTURE (INFECTION SUSPECTED)
Bacteria, UA: NONE SEEN
Bilirubin Urine: NEGATIVE
Glucose, UA: >1000 mg/dL — AB
Ketones, ur: NEGATIVE mg/dL
Leukocytes,Ua: NEGATIVE
Nitrite: NEGATIVE
Protein, ur: 30 mg/dL — AB
Specific Gravity, Urine: 1.014 (ref 1.005–1.030)
pH: 5.5 (ref 5.0–8.0)

## 2024-05-12 LAB — COMPREHENSIVE METABOLIC PANEL WITH GFR
ALT: 13 U/L (ref 0–44)
AST: 27 U/L (ref 15–41)
Albumin: 3.7 g/dL (ref 3.5–5.0)
Alkaline Phosphatase: 73 U/L (ref 38–126)
Anion gap: 16 — ABNORMAL HIGH (ref 5–15)
BUN: 20 mg/dL (ref 8–23)
CO2: 25 mmol/L (ref 22–32)
Calcium: 9.8 mg/dL (ref 8.9–10.3)
Chloride: 97 mmol/L — ABNORMAL LOW (ref 98–111)
Creatinine, Ser: 1.49 mg/dL — ABNORMAL HIGH (ref 0.61–1.24)
GFR, Estimated: 46 mL/min — ABNORMAL LOW (ref >60)
Glucose, Bld: 283 mg/dL — ABNORMAL HIGH (ref 70–99)
Potassium: 2.7 mmol/L — CL (ref 3.5–5.1)
Sodium: 139 mmol/L (ref 135–145)
Total Bilirubin: 0.6 mg/dL (ref 0.0–1.2)
Total Protein: 6.5 g/dL (ref 6.5–8.1)

## 2024-05-12 LAB — MAGNESIUM: Magnesium: 1.6 mg/dL — ABNORMAL LOW (ref 1.7–2.4)

## 2024-05-12 MED ORDER — SODIUM CHLORIDE 0.9 % IV BOLUS
500.0000 mL | Freq: Once | INTRAVENOUS | Status: AC
  Administered 2024-05-12: 500 mL via INTRAVENOUS

## 2024-05-12 MED ORDER — POTASSIUM CHLORIDE CRYS ER 20 MEQ PO TBCR
40.0000 meq | EXTENDED_RELEASE_TABLET | Freq: Once | ORAL | Status: AC
  Administered 2024-05-12: 40 meq via ORAL
  Filled 2024-05-12: qty 2

## 2024-05-12 MED ORDER — POTASSIUM CHLORIDE 10 MEQ/100ML IV SOLN
10.0000 meq | INTRAVENOUS | Status: AC
  Administered 2024-05-12 (×2): 10 meq via INTRAVENOUS
  Filled 2024-05-12 (×2): qty 100

## 2024-05-12 MED ORDER — ONDANSETRON HCL 4 MG/2ML IJ SOLN
4.0000 mg | Freq: Once | INTRAMUSCULAR | Status: AC
  Administered 2024-05-12: 4 mg via INTRAVENOUS
  Filled 2024-05-12: qty 2

## 2024-05-12 MED ORDER — MAGNESIUM SULFATE 2 GM/50ML IV SOLN
2.0000 g | Freq: Once | INTRAVENOUS | Status: AC
  Administered 2024-05-12: 2 g via INTRAVENOUS
  Filled 2024-05-12: qty 50

## 2024-05-12 NOTE — ED Triage Notes (Signed)
 He c/o dizziness x 1 week. He tells me he has been dizzy and having issues with dry heaves since returning from [place]  one week ago. He denies fever and is in no distress. He also c/o some rare, isolated diarrhea stools. He is alert and oriented x 4 with clear speech

## 2024-05-12 NOTE — ED Notes (Signed)
Water and crackers given for PO challenge.

## 2024-05-12 NOTE — ED Notes (Signed)
 CRITICAL VALUE STICKER  CRITICAL VALUE:K+  RECEIVER (on-site recipient of call):ONEIDA Sharps, RN  DATE & TIME NOTIFIED:   MESSENGER (representative from lab):  MD NOTIFIED: Belfi  TIME OF NOTIFICATION:1218  RESPONSE:

## 2024-05-12 NOTE — ED Provider Notes (Addendum)
 [place] [facility] DEPARTMENT AT [facility] Provider Note   CSN: [id number] Arrival date & time: 05/12/24  1023     Patient presents with: Dizziness   Keith Espinoza is a 83 y.o. male.   Patient is a 83 year old male who presents with dizziness.  He has a history of coronary artery disease, diabetes, hypertension, status post right BKA.  He said he recently traveled to [place] .  He came back this past weekend.  He was doing okay on Sunday the day he was back but on Monday he noted that he was dizzy.  He does not describe a spinning sensation.  He says he does not have any dizziness when he is laying flat.  When he moves his head from side to side, he does not have any induced dizziness.  He only has dizziness when he stands up and tries to walk.  He denies any vision changes or speech deficits.  No numbness or weakness to his extremities.  No chest pain or trouble breathing.  No increased leg swelling.  He does have some associated nausea and vomiting intermittently over this last week.  He has had a few episodes of some loose stools but no diarrhea today.  No noticeable blood in his stool or emesis.  He was having a problem with dizziness back in the spring.  His family member who is at bedside states that initially they thought it was vertigo but then decided it was not vertigo and was likely more medication effect.  He was started on Ranexa  but ultimately this was DC'd because it was felt that this was dropping his blood pressure.        Prior to Admission medications   Medication Sig Start Date End Date Taking? Authorizing Provider  acetaminophen  (TYLENOL ) 500 MG tablet Take 2 tablets (1,000 mg total) by mouth every 6 (six) hours. Patient taking differently: Take 1,000 mg by mouth as needed. 10/12/23   [name], MD  allopurinol  (ZYLOPRIM ) 300 MG tablet Take 300 mg by mouth daily.    [provider]  Ascorbic Acid (VITAMIN C PO) Take 1,000 mcg by mouth at  bedtime.    [provider]  aspirin  EC 81 MG tablet Take 81 mg by mouth daily. Swallow whole.    [provider]  Cholecalciferol  (VITAMIN D3) 50 MCG (2000 UT) TABS Take 2,000 Units by mouth at bedtime.    [provider]  clopidogrel  (PLAVIX ) 75 MG tablet Take 1 tablet (75 mg total) by mouth daily. 08/12/23   [name], PA-C  Continuous Glucose Sensor (DEXCOM G7 SENSOR) MISC 1 Device by Does not apply route as directed. 12/05/23   [name], [name], MD  Cyanocobalamin  (B-12) 1000 MCG TABS Take 1,000 mcg by mouth daily.    [provider]  empagliflozin  (JARDIANCE ) 25 MG TABS tablet TAKE 1 TABLET(25 MG) BY MOUTH DAILY 12/30/23   [name], [name], MD  furosemide  (LASIX ) 20 MG tablet Take 1 tablet (20 mg total) by mouth daily. Patient taking differently: Take 40 mg by mouth daily. 07/10/23   [name], MD  gabapentin  (NEURONTIN ) 300 MG capsule Take 600 mg by mouth at bedtime.    [provider]  gabapentin  (NEURONTIN ) 400 MG capsule Take 1 capsule (400 mg total) by mouth daily. 07/10/23   [name], MD  insulin  aspart (NOVOLOG  FLEXPEN) 100 UNIT/ML FlexPen Max daily 30 units 12/30/23   [name], [name], MD  insulin  degludec (TRESIBA  FLEXTOUCH) 100 UNIT/ML  FlexTouch Pen Inject 10 Units into the skin daily. 12/30/23   [name], [name], MD  Insulin  Pen Needle 32G X 4 MM MISC 1 Device by Does not apply route in the morning, at noon, in the evening, and at bedtime. 12/30/23   [name], [name], MD  isosorbide  mononitrate (IMDUR ) 60 MG 24 hr tablet Take 1 tablet (60 mg total) by mouth daily. 08/12/23   [name], PA-C  Magnesium  400 MG CAPS Take 400 mg by mouth at bedtime.    [provider]  metFORMIN  (GLUCOPHAGE ) 1000 MG tablet Take 1 tablet (1,000 mg total) by mouth 2 (two) times daily. 12/30/23   [name], [name], MD  metoprolol  succinate (TOPROL -XL) 25 MG 24 hr tablet Take 12.5 mg  by mouth daily.    [provider]  nitroGLYCERIN  (NITROSTAT ) 0.4 MG SL tablet Place 1 tablet (0.4 mg total) under the tongue every 5 (five) minutes x 3 doses as needed for chest pain. 04/16/24   [name], MD  Omega-3 Fatty Acids (FISH OIL) 500 MG CAPS Take 500 mg by mouth daily.    [provider]  pantoprazole  (PROTONIX ) 40 MG tablet Take 1 tablet (40 mg total) by mouth 2 (two) times daily. 07/10/23   [name], MD  pioglitazone  (ACTOS ) 30 MG tablet Take 1 tablet (30 mg total) by mouth daily. 12/30/23   [name], [name], MD  Potassium Chloride  ER 20 MEQ TBCR Take 1 tablet (20 mEq total) by mouth daily. 08/12/23   [name], PA-C  ranolazine  (RANEXA ) 500 MG 12 hr tablet Take 1 tablet (500 mg total) by mouth 2 (two) times daily. 03/20/24   [name], MD  rosuvastatin  (CRESTOR ) 20 MG tablet Take 1 tablet (20 mg total) by mouth daily. 08/12/23   [name], PA-C  sacubitril -valsartan  (ENTRESTO ) 24-26 MG Take 1 tablet by mouth 2 (two) times daily. 08/12/23   [name], PA-C  senna-docusate (SENOKOT-S) 8.6-50 MG tablet Take 1 tablet by mouth at bedtime. Patient taking differently: Take 1 tablet by mouth 2 (two) times daily. 02/03/21   [name], MD  tamsulosin  (FLOMAX ) 0.4 MG CAPS capsule Take 1 capsule (0.4 mg total) by mouth daily after supper. Patient taking differently: Take 0.4 mg by mouth in the morning and at bedtime. 07/09/23   Dameron, Marisa, DO  Turmeric (QC TUMERIC COMPLEX PO) Take 500 mg by mouth at bedtime.    [provider]    Allergies: Simvastatin, Zetia [ezetimibe], and Dilaudid  [hydromorphone  hcl]    Review of Systems  Constitutional:  Negative for chills, diaphoresis, fatigue and fever.  HENT:  Negative for congestion, rhinorrhea and sneezing.   Eyes: Negative.   Respiratory:  Negative for cough, chest tightness and shortness of breath.   Cardiovascular:  Negative for chest pain and leg swelling.  Gastrointestinal:   Positive for diarrhea, nausea and vomiting. Negative for abdominal pain and blood in stool.  Genitourinary:  Negative for difficulty urinating, flank pain, frequency and hematuria.  Musculoskeletal:  Negative for arthralgias and back pain.  Skin:  Negative for rash.  Neurological:  Positive for dizziness and light-headedness. Negative for speech difficulty, weakness, numbness and headaches.    Updated Vital Signs BP (!) 102/54 (BP Location: Right Arm)   Pulse 70   Temp 97.9 F (36.6 C) (Oral)   Resp 18   SpO2 95%   Physical Exam Constitutional:      Appearance: He is well-developed.  HENT:     Head:  Normocephalic and atraumatic.  Eyes:     Pupils: Pupils are equal, round, and reactive to light.  Cardiovascular:     Rate and Rhythm: Normal rate and regular rhythm.     Heart sounds: Murmur heard.  Pulmonary:     Effort: Pulmonary effort is normal. No respiratory distress.     Breath sounds: Normal breath sounds. No wheezing or rales.  Chest:     Chest wall: No tenderness.  Abdominal:     General: Bowel sounds are normal.     Palpations: Abdomen is soft.     Tenderness: There is no abdominal tenderness. There is no guarding or rebound.  Musculoskeletal:        General: Normal range of motion.     Cervical back: Normal range of motion and neck supple.     Comments: Right leg prosthesis in place  Lymphadenopathy:     Cervical: No cervical adenopathy.  Skin:    General: Skin is warm and dry.     Findings: No rash.  Neurological:     Mental Status: He is alert and oriented to person, place, and time.     Comments: Motor 5/5 all extremities Sensation grossly intact to LT all extremities Finger to Nose intact, no pronator drift CN II-XII grossly intact       (all labs ordered are listed, but only abnormal results are displayed) Labs Reviewed  COMPREHENSIVE METABOLIC PANEL WITH GFR - Abnormal; Notable for the following components:      Result Value   Potassium 2.7 (*)     Chloride 97 (*)    Glucose, Bld 283 (*)    Creatinine, Ser 1.49 (*)    GFR, Estimated 46 (*)    Anion gap 16 (*)    All other components within normal limits  CBC WITH DIFFERENTIAL/PLATELET - Abnormal; Notable for the following components:   RBC 3.12 (*)    Hemoglobin 10.4 (*)    HCT 30.2 (*)    RDW 15.9 (*)    Platelets 147 (*)    Abs Immature Granulocytes 0.08 (*)    All other components within normal limits  URINALYSIS, W/ REFLEX TO CULTURE (INFECTION SUSPECTED) - Abnormal; Notable for the following components:   Glucose, UA >1,000 (*)    Hgb urine dipstick TRACE (*)    Protein, ur 30 (*)    All other components within normal limits  MAGNESIUM  - Abnormal; Notable for the following components:   Magnesium  1.6 (*)    All other components within normal limits  URINE CULTURE    EKG: EKG Interpretation Date/Time:  Saturday May 12 2024 11:24:23 EDT Ventricular Rate:  78 PR Interval:  148 QRS Duration:  162 QT Interval:  505 QTC Calculation: 522 R Axis:   81  Text Interpretation: Sinus rhythm Supraventricular bigeminy IVCD, consider atypical RBBB Inferior infarct, age indeterminate Abnormal lateral Q waves Confirmed by Lenor Hollering [phone]) on 05/12/2024 11:28:25 AM  Radiology: No results found.   Procedures   Medications Ordered in the ED  potassium chloride  10 mEq in 100 mL IVPB (10 mEq Intravenous New Bag/Given 05/12/24 1347)  magnesium  sulfate IVPB 2 g 50 mL (2 g Intravenous New Bag/Given 05/12/24 1349)  sodium chloride  0.9 % bolus 500 mL (500 mLs Intravenous New Bag/Given 05/12/24 1128)  ondansetron  (ZOFRAN ) injection 4 mg (4 mg Intravenous Given 05/12/24 1125)  potassium chloride  SA (KLOR-CON  M) CR tablet 40 mEq (40 mEq Oral Given 05/12/24 1232)  Medical Decision Making Amount and/or Complexity of Data Reviewed Labs: ordered.  Risk Prescription drug management.   This patient presents to the ED for concern of dizziness,  vomiting, this involves an extensive number of treatment options, and is a complaint that carries with it a high risk of complications and morbidity.  I considered the following differential and admission for this acute, potentially life threatening condition.  The differential diagnosis includes vertigo, stroke, ACS, electrolyte abnormality, gastroenteritis, medication reaction, hypotension  MDM:    Patient is a 83 year old who presents with dizziness associated with nausea and vomiting.  It seems more consistent with lightheadedness rather than vertiginous.  He does not have any focal neurologic deficits.  No spinning sensation.  Labs show a markedly low potassium.  His magnesium  is also low.  His creatinine is also elevated.  This could be an overdiuresis with his Lasix .  Will give some potassium replacement both IV and oral as well as some IV fluids.  He was also given magnesium  replacement.  EKG does not show any ischemic changes.  He does not have symptoms that would be more concerning for ACS.  His urine has some white cells but no bacteria or other signs of infection.  He says he is feeling much better after fluids here in the ED.  His blood pressure has improved.  The last 1 in the room with me was 112/82.  Discussed with him options of admission for ongoing treatment and observation versus being discharged home and he reports I am not staying in the hospital.  He overall is feeling much better.  He we stood him up by the bed and he does not have any ongoing dizziness.  He does not typically ambulate much at baseline given his BKA.  He mostly uses a wheelchair at home.  Sometimes he will walk with a walker.  He feels like he is at his baseline level now without any ongoing dizziness.  Given this, have a low suspicion that this is vertigo or posterior circulation stroke.  Suspect his dizziness is from the dehydration.  He has potassium to use at home but he has not been taking it.  He said that he did  better taking it here when they cut it in half so he will start taking his home potassium.  Instructed him to have close follow-up with his PCP to have his labs rechecked.  Return precautions were given.  Instructions were given to family member at bedside as well.  (Labs, imaging, consults)  Labs: I Ordered, and personally interpreted labs.  The pertinent results include: Hypokalemia, AKI, low magnesium   Imaging Studies ordered: I ordered imaging studies including none  Additional history obtained from family member.  External records from outside source obtained and reviewed including history  Cardiac Monitoring: The patient was maintained on a cardiac monitor.  If on the cardiac monitor, I personally viewed and interpreted the cardiac monitored which showed an underlying rhythm of: Sinus rhythm  Reevaluation: After the interventions noted above, I reevaluated the patient and found that they have :improved  Social Determinants of Health:    Disposition: Discharged to home  Co morbidities that complicate the patient evaluation  Past Medical History:  Diagnosis Date   Allergic rhinitis    Anemia    Aortic stenosis    Arthritis    Basal cell carcinoma 11/01/2019    bcc left chest treatment [place] [id number] [id number]    CHF (congestive heart failure) (HCC)    Chronic  leg pain    right   Chronic lower back pain    Coronary artery disease    a. Stenting to RCA 2004; staged DES to LAD and Cx 2004. DES to [facility] 2012. b. DES to mCx, PTCA to dCx 11/2011. c. Lateral wall MI 2013 s/p PTCA to distal Cx & DES to mid OM2 11/2011. d. Low risk nuc 04/2014, EF wnl.   COVID-19    Diabetes mellitus    Insulin  dependent   Diabetic neuropathy (HCC)    MILD   Diverticulosis    Dysrhythmia    Bertrum syndrome    Gout    right wrist; right foot; right elbow; have had it since 1970's   H/O hiatal hernia    Heart murmur    History of echocardiogram    aortic sclerosis per echo 12/09 EF 65%, otherwise normal    History of hemorrhoids    BLEEDING   History of kidney stones    h/o   Hypertension    Diagnosed 1995    Myocardial infarction [facility])    Pancreatic pseudocyst    a. s/p remote drainage 2006.   Thrombocytopenia (HCC)    Seen on oldest labs in system from 2004   Vitamin B 12 deficiency    orally replaced     Medicines Meds ordered this encounter  Medications   sodium chloride  0.9 % bolus 500 mL   ondansetron  (ZOFRAN ) injection 4 mg   potassium chloride  10 mEq in 100 mL IVPB   potassium chloride  SA (KLOR-CON  M) CR tablet 40 mEq   magnesium  sulfate IVPB 2 g 50 mL    I have reviewed the patients home medicines and have made adjustments as needed  Problem List / ED Course: Problem List Items Addressed This Visit   None Visit Diagnoses       Dizziness    -  Primary     Hypokalemia         Hypomagnesemia         Elevated serum creatinine                    Final diagnoses:  Dizziness  Hypokalemia  Hypomagnesemia  Elevated serum creatinine    ED Discharge Orders     None          Lenor Hollering, MD 05/12/24 1433    Lenor Hollering, MD 05/12/24 1434

## 2024-05-12 NOTE — Discharge Instructions (Signed)
 Make sure that you are taking the potassium as discussed.  You need to have close follow-up with your primary care doctor to have your blood work rechecked.  This would include your potassium, magnesium  and creatinine levels.  Return to the emergency room if you have any worsening symptoms.

## 2024-05-13 ENCOUNTER — Other Ambulatory Visit: Payer: Self-pay | Admitting: Internal Medicine

## 2024-05-14 ENCOUNTER — Other Ambulatory Visit: Payer: Self-pay

## 2024-05-14 ENCOUNTER — Observation Stay (HOSPITAL_BASED_OUTPATIENT_CLINIC_OR_DEPARTMENT_OTHER)
Admission: EM | Admit: 2024-05-14 | Discharge: 2024-05-16 | Disposition: A | Discharge status: Home / Self Care | Attending: Internal Medicine | Admitting: Internal Medicine

## 2024-05-14 ENCOUNTER — Encounter (HOSPITAL_BASED_OUTPATIENT_CLINIC_OR_DEPARTMENT_OTHER): Payer: Self-pay

## 2024-05-14 ENCOUNTER — Emergency Department (HOSPITAL_BASED_OUTPATIENT_CLINIC_OR_DEPARTMENT_OTHER)

## 2024-05-14 DIAGNOSIS — Z96641 Presence of right artificial hip joint: Secondary | ICD-10-CM | POA: Insufficient documentation

## 2024-05-14 DIAGNOSIS — Z794 Long term (current) use of insulin: Secondary | ICD-10-CM | POA: Diagnosis not present

## 2024-05-14 DIAGNOSIS — Z8679 Personal history of other diseases of the circulatory system: Secondary | ICD-10-CM | POA: Diagnosis not present

## 2024-05-14 DIAGNOSIS — Z7982 Long term (current) use of aspirin: Secondary | ICD-10-CM | POA: Diagnosis not present

## 2024-05-14 DIAGNOSIS — I6782 Cerebral ischemia: Secondary | ICD-10-CM | POA: Diagnosis not present

## 2024-05-14 DIAGNOSIS — Z85828 Personal history of other malignant neoplasm of skin: Secondary | ICD-10-CM | POA: Insufficient documentation

## 2024-05-14 DIAGNOSIS — Z7984 Long term (current) use of oral hypoglycemic drugs: Secondary | ICD-10-CM | POA: Insufficient documentation

## 2024-05-14 DIAGNOSIS — Z7189 Other specified counseling: Secondary | ICD-10-CM

## 2024-05-14 DIAGNOSIS — R55 Syncope and collapse: Principal | ICD-10-CM | POA: Insufficient documentation

## 2024-05-14 DIAGNOSIS — Z955 Presence of coronary angioplasty implant and graft: Secondary | ICD-10-CM | POA: Diagnosis not present

## 2024-05-14 DIAGNOSIS — I130 Hypertensive heart and chronic kidney disease with heart failure and stage 1 through stage 4 chronic kidney disease, or unspecified chronic kidney disease: Secondary | ICD-10-CM | POA: Diagnosis not present

## 2024-05-14 DIAGNOSIS — E1122 Type 2 diabetes mellitus with diabetic chronic kidney disease: Secondary | ICD-10-CM | POA: Insufficient documentation

## 2024-05-14 DIAGNOSIS — E1165 Type 2 diabetes mellitus with hyperglycemia: Secondary | ICD-10-CM | POA: Diagnosis not present

## 2024-05-14 DIAGNOSIS — I6523 Occlusion and stenosis of bilateral carotid arteries: Secondary | ICD-10-CM | POA: Diagnosis not present

## 2024-05-14 DIAGNOSIS — R911 Solitary pulmonary nodule: Secondary | ICD-10-CM | POA: Insufficient documentation

## 2024-05-14 DIAGNOSIS — Z515 Encounter for palliative care: Secondary | ICD-10-CM

## 2024-05-14 DIAGNOSIS — R918 Other nonspecific abnormal finding of lung field: Secondary | ICD-10-CM | POA: Diagnosis present

## 2024-05-14 DIAGNOSIS — Z8616 Personal history of COVID-19: Secondary | ICD-10-CM | POA: Insufficient documentation

## 2024-05-14 DIAGNOSIS — N1831 Chronic kidney disease, stage 3a: Secondary | ICD-10-CM | POA: Diagnosis not present

## 2024-05-14 DIAGNOSIS — R42 Dizziness and giddiness: Principal | ICD-10-CM

## 2024-05-14 DIAGNOSIS — Z7902 Long term (current) use of antithrombotics/antiplatelets: Secondary | ICD-10-CM | POA: Insufficient documentation

## 2024-05-14 DIAGNOSIS — Z87891 Personal history of nicotine dependence: Secondary | ICD-10-CM | POA: Insufficient documentation

## 2024-05-14 DIAGNOSIS — I2510 Atherosclerotic heart disease of native coronary artery without angina pectoris: Secondary | ICD-10-CM | POA: Diagnosis not present

## 2024-05-14 DIAGNOSIS — Z96652 Presence of left artificial knee joint: Secondary | ICD-10-CM | POA: Insufficient documentation

## 2024-05-14 DIAGNOSIS — I480 Paroxysmal atrial fibrillation: Secondary | ICD-10-CM | POA: Insufficient documentation

## 2024-05-14 DIAGNOSIS — Z89511 Acquired absence of right leg below knee: Secondary | ICD-10-CM | POA: Insufficient documentation

## 2024-05-14 DIAGNOSIS — R112 Nausea with vomiting, unspecified: Secondary | ICD-10-CM | POA: Insufficient documentation

## 2024-05-14 DIAGNOSIS — I5022 Chronic systolic (congestive) heart failure: Secondary | ICD-10-CM | POA: Diagnosis present

## 2024-05-14 DIAGNOSIS — K5730 Diverticulosis of large intestine without perforation or abscess without bleeding: Secondary | ICD-10-CM | POA: Diagnosis not present

## 2024-05-14 DIAGNOSIS — E1140 Type 2 diabetes mellitus with diabetic neuropathy, unspecified: Secondary | ICD-10-CM | POA: Insufficient documentation

## 2024-05-14 DIAGNOSIS — J811 Chronic pulmonary edema: Secondary | ICD-10-CM | POA: Diagnosis not present

## 2024-05-14 DIAGNOSIS — E1151 Type 2 diabetes mellitus with diabetic peripheral angiopathy without gangrene: Secondary | ICD-10-CM | POA: Insufficient documentation

## 2024-05-14 DIAGNOSIS — Z79899 Other long term (current) drug therapy: Secondary | ICD-10-CM | POA: Diagnosis not present

## 2024-05-14 DIAGNOSIS — G9389 Other specified disorders of brain: Secondary | ICD-10-CM | POA: Diagnosis not present

## 2024-05-14 LAB — URINE CULTURE: Culture: 60000 — AB

## 2024-05-14 LAB — CBC
HCT: 30.1 % — ABNORMAL LOW (ref 39.0–52.0)
Hemoglobin: 10.3 g/dL — ABNORMAL LOW (ref 13.0–17.0)
MCH: 34.2 pg — ABNORMAL HIGH (ref 26.0–34.0)
MCHC: 34.2 g/dL (ref 30.0–36.0)
MCV: 100 fL (ref 80.0–100.0)
Platelets: 132 K/uL — ABNORMAL LOW (ref 150–400)
RBC: 3.01 MIL/uL — ABNORMAL LOW (ref 4.22–5.81)
RDW: 16.1 % — ABNORMAL HIGH (ref 11.5–15.5)
WBC: 11.3 K/uL — ABNORMAL HIGH (ref 4.0–10.5)
nRBC: 0 % (ref 0.0–0.2)

## 2024-05-14 LAB — COMPREHENSIVE METABOLIC PANEL WITH GFR
ALT: 17 U/L (ref 0–44)
AST: 36 U/L (ref 15–41)
Albumin: 3.5 g/dL (ref 3.5–5.0)
Alkaline Phosphatase: 105 U/L (ref 38–126)
Anion gap: 18 — ABNORMAL HIGH (ref 5–15)
BUN: 21 mg/dL (ref 8–23)
CO2: 22 mmol/L (ref 22–32)
Calcium: 10 mg/dL (ref 8.9–10.3)
Chloride: 97 mmol/L — ABNORMAL LOW (ref 98–111)
Creatinine, Ser: 1.46 mg/dL — ABNORMAL HIGH (ref 0.61–1.24)
GFR, Estimated: 47 mL/min — ABNORMAL LOW (ref >60)
Glucose, Bld: 353 mg/dL — ABNORMAL HIGH (ref 70–99)
Potassium: 3.7 mmol/L (ref 3.5–5.1)
Sodium: 137 mmol/L (ref 135–145)
Total Bilirubin: 0.5 mg/dL (ref 0.0–1.2)
Total Protein: 6.6 g/dL (ref 6.5–8.1)

## 2024-05-14 LAB — URINALYSIS, MICROSCOPIC (REFLEX)

## 2024-05-14 LAB — TROPONIN T, HIGH SENSITIVITY
Troponin T High Sensitivity: 92 ng/L — ABNORMAL HIGH (ref <19)
Troponin T High Sensitivity: 83 ng/L — ABNORMAL HIGH (ref <19)

## 2024-05-14 LAB — PRO BRAIN NATRIURETIC PEPTIDE: Pro Brain Natriuretic Peptide: 1468 pg/mL — ABNORMAL HIGH (ref <300.0)

## 2024-05-14 LAB — LIPASE, BLOOD: Lipase: 31 U/L (ref 11–51)

## 2024-05-14 LAB — MAGNESIUM: Magnesium: 1.7 mg/dL (ref 1.7–2.4)

## 2024-05-14 MED ORDER — SODIUM CHLORIDE 0.9 % IV BOLUS
1000.0000 mL | Freq: Once | INTRAVENOUS | Status: AC
  Administered 2024-05-14 (×2): 1000 mL via INTRAVENOUS

## 2024-05-14 MED ORDER — IOHEXOL 350 MG/ML SOLN
100.0000 mL | Freq: Once | INTRAVENOUS | Status: AC | PRN
  Administered 2024-05-14 (×2): 80 mL via INTRAVENOUS

## 2024-05-14 MED ORDER — ORAL CARE MOUTH RINSE: 15.0000 mL | OROMUCOSAL | Status: DC | PRN

## 2024-05-14 MED ORDER — MECLIZINE HCL 25 MG PO TABS
12.5000 mg | ORAL_TABLET | Freq: Once | ORAL | Status: AC
  Administered 2024-05-14 (×2): 12.5 mg via ORAL
  Filled 2024-05-14: qty 1

## 2024-05-14 NOTE — ED Provider Notes (Signed)
 Keith Espinoza EMERGENCY DEPARTMENT AT [facility] Provider Note   CSN: [id number] Arrival date & time: 05/14/24  1328     Patient presents with: Emesis and Loss of Consciousness   Keith Espinoza is a 83 y.o. male with an extensive past medical history including peripheral vascular disease, coronary artery disease status post 6 stents, history of paroxysmal atrial fibrillation not on DOAC therapy, diabetes, macular degeneration, CHF, adrenal adenoma status post BKA who presents emergency department for dizziness nausea vomiting and near syncope.  He was seen for the same complaint on Saturday, 05/12/2024.  He complains of lightheadedness.  He is fine when he is laying flat but he can only sit up for few minutes without beginning to feel like he is going to lose consciousness when he is sitting upright.  This has been going on for 3 weeks per the daughter.  They have seen a vertigo specialist and were deemed to not have vertigo.  He was previously taking Ranexa  but had this stopped by his cardiologist as he thought it might be causing his dizziness.  Patient was also diagnosed with hypokalemia hypomagnesemia when he was seen on Saturday and had these repleted did not want to be admitted.  His daughter states.  It did note good and he has not changed since.  He continues to have associated nausea and vomiting when he is sitting upright for a long period of time.  He denies chest pain or unilateral lower extremity swelling.  Denies diarrhea.    Emesis Loss of Consciousness Associated symptoms: vomiting        Prior to Admission medications   Medication Sig Start Date End Date Taking? Authorizing Provider  acetaminophen  (TYLENOL ) 500 MG tablet Take 2 tablets (1,000 mg total) by mouth every 6 (six) hours. Patient taking differently: Take 1,000 mg by mouth as needed. 10/12/23   [name], MD  allopurinol  (ZYLOPRIM ) 300 MG tablet Take 300 mg by mouth daily.    [provider]  Ascorbic Acid (VITAMIN C PO) Take 1,000 mcg by mouth at bedtime.    [provider]  aspirin  EC 81 MG tablet Take 81 mg by mouth daily. Swallow whole.    [provider]  Cholecalciferol  (VITAMIN D3) 50 MCG (2000 UT) TABS Take 2,000 Units by mouth at bedtime.    [provider]  clopidogrel  (PLAVIX ) 75 MG tablet Take 1 tablet (75 mg total) by mouth daily. 08/12/23   [name], PA-C  Continuous Glucose Sensor (DEXCOM G7 SENSOR) MISC 1 Device by Does not apply route as directed. 12/05/23   [name], [name], MD  Cyanocobalamin  (B-12) 1000 MCG TABS Take 1,000 mcg by mouth daily.    [provider]  furosemide  (LASIX ) 20 MG tablet Take 1 tablet (20 mg total) by mouth daily. Patient taking differently: Take 40 mg by mouth daily. 07/10/23   [name], MD  gabapentin  (NEURONTIN ) 300 MG capsule Take 600 mg by mouth at bedtime.    [provider]  gabapentin  (NEURONTIN ) 400 MG capsule Take 1 capsule (400 mg total) by mouth daily. 07/10/23   [name], MD  insulin  aspart (NOVOLOG  FLEXPEN) 100 UNIT/ML FlexPen Max daily 30 units 12/30/23   [name], [name], MD  insulin  degludec (TRESIBA  FLEXTOUCH) 100 UNIT/ML FlexTouch Pen Inject 10 Units into the skin daily. 12/30/23   [name], [name], MD  Insulin  Pen Needle 32G X 4 MM MISC 1 Device by Does not apply route in the morning,  at noon, in the evening, and at bedtime. 12/30/23   [name], [name], MD  isosorbide  mononitrate (IMDUR ) 60 MG 24 hr tablet Take 1 tablet (60 mg total) by mouth daily. 08/12/23   [name], PA-C  JARDIANCE  25 MG TABS tablet TAKE 1 TABLET BY MOUTH DAILY 05/14/24   [name], [name], MD  Magnesium  400 MG CAPS Take 400 mg by mouth at bedtime.    [provider]  metFORMIN  (GLUCOPHAGE ) 1000 MG tablet Take 1 tablet (1,000 mg total) by mouth 2 (two) times daily. 12/30/23   [name], [name], MD  metoprolol  succinate  (TOPROL -XL) 25 MG 24 hr tablet Take 12.5 mg by mouth daily.    [provider]  nitroGLYCERIN  (NITROSTAT ) 0.4 MG SL tablet Place 1 tablet (0.4 mg total) under the tongue every 5 (five) minutes x 3 doses as needed for chest pain. 04/16/24   [name], MD  Omega-3 Fatty Acids (FISH OIL) 500 MG CAPS Take 500 mg by mouth daily.    [provider]  pantoprazole  (PROTONIX ) 40 MG tablet Take 1 tablet (40 mg total) by mouth 2 (two) times daily. 07/10/23   [name], MD  pioglitazone  (ACTOS ) 30 MG tablet Take 1 tablet (30 mg total) by mouth daily. 12/30/23   [name], [name], MD  Potassium Chloride  ER 20 MEQ TBCR Take 1 tablet (20 mEq total) by mouth daily. 08/12/23   [name], PA-C  ranolazine  (RANEXA ) 500 MG 12 hr tablet Take 1 tablet (500 mg total) by mouth 2 (two) times daily. 03/20/24   [name], MD  rosuvastatin  (CRESTOR ) 20 MG tablet Take 1 tablet (20 mg total) by mouth daily. 08/12/23   Conte, Tessa N, PA-C  sacubitril -valsartan  (ENTRESTO ) 24-26 MG Take 1 tablet by mouth 2 (two) times daily. 08/12/23   [name], PA-C  senna-docusate (SENOKOT-S) 8.6-50 MG tablet Take 1 tablet by mouth at bedtime. Patient taking differently: Take 1 tablet by mouth 2 (two) times daily. 02/03/21   [name], MD  tamsulosin  (FLOMAX ) 0.4 MG CAPS capsule Take 1 capsule (0.4 mg total) by mouth daily after supper. Patient taking differently: Take 0.4 mg by mouth in the morning and at bedtime. 07/09/23   Dameron, Marisa, DO  Turmeric (QC TUMERIC COMPLEX PO) Take 500 mg by mouth at bedtime.    [provider]    Allergies: Simvastatin, Zetia [ezetimibe], and Dilaudid  [hydromorphone  hcl]    Review of Systems  Cardiovascular:  Positive for syncope.  Gastrointestinal:  Positive for vomiting.    Updated Vital Signs BP (!) 104/54   Pulse 85   Temp 97.9 F (36.6 C) (Oral)   Resp 14   SpO2 99%   Physical Exam Vitals and nursing note reviewed.  Constitutional:       General: He is not in acute distress.    Appearance: He is well-developed. He is ill-appearing. He is not diaphoretic.  HENT:     Head: Normocephalic and atraumatic.     Mouth/Throat:     Mouth: Mucous membranes are dry.  Eyes:     General: No scleral icterus.    Extraocular Movements: Extraocular movements intact.     Conjunctiva/sclera: Conjunctivae normal.     Pupils: Pupils are equal, round, and reactive to light.  Cardiovascular:     Rate and Rhythm: Normal rate and regular rhythm.     Heart sounds: Normal heart sounds.  Pulmonary:     Effort: Pulmonary effort is normal. No respiratory distress.  Breath sounds: Normal breath sounds.  Abdominal:     Palpations: Abdomen is soft.     Tenderness: There is no abdominal tenderness.  Musculoskeletal:     Cervical back: Normal range of motion and neck supple.  Skin:    General: Skin is warm and dry.  Neurological:     Mental Status: He is alert.  Psychiatric:        Behavior: Behavior normal.     (all labs ordered are listed, but only abnormal results are displayed) Labs Reviewed  LIPASE, BLOOD  COMPREHENSIVE METABOLIC PANEL WITH GFR  CBC  URINALYSIS, ROUTINE W REFLEX MICROSCOPIC  MAGNESIUM   PRO BRAIN NATRIURETIC PEPTIDE  TROPONIN T, HIGH SENSITIVITY    EKG: None  Radiology: No results found.   Procedures   Medications Ordered in the ED  sodium chloride  0.9 % bolus 1,000 mL (1,000 mLs Intravenous New Bag/Given 05/14/24 1434)    Clinical Course as of 05/14/24 1557  Mon May 14, 2024  1514 Potassium: 3.7 [AH]  1514 Anion gap(!): 18 [AH]    Clinical Course User Index [AH] Arloa Chroman, PA-C                                 Medical Decision Making Amount and/or Complexity of Data Reviewed Labs: ordered. Decision-making details documented in ED Course. Radiology: ordered.  Risk Prescription drug management. Decision regarding hospitalization.   This patient presents to the ED for concern of  lightheadedness, near syncope, this involves an extensive number of treatment options, and is a complaint that carries with it a high risk of complications and morbidity.  The differential for syncope is extensive and includes, but is not limited to: arrythmia (Vtach, SVT, SSS, sinus arrest, AV block, bradycardia) aortic stenosis, AMI, HOCM, PE, atrial myxoma, pulmonary hypertension, orthostatic hypotension, (hypovolemia, drug effect, GB syndrome, micturition, cough, swall) carotid sinus sensitivity, Seizure, TIA/CVA, hypoglycemia,  Vertigo. Also considered adrenal insufficiency, overdiuresis, overmedication with antihypertensives and long-acting vasodilators.  Co morbidities:   has a past medical history of Allergic rhinitis, Anemia, Aortic stenosis, Arthritis, Basal cell carcinoma (11/01/2019), CHF (congestive heart failure) (HCC), Chronic leg pain, Chronic lower back pain, Coronary artery disease, COVID-19, Diabetes mellitus, Diabetic neuropathy (HCC), Diverticulosis, Dysrhythmia, Gilbert syndrome, Gout, H/O hiatal hernia, Heart murmur, History of echocardiogram, History of hemorrhoids, History of kidney stones, Hypertension, Myocardial infarction [facility]), Pancreatic pseudocyst, Thrombocytopenia (HCC), and Vitamin B 12 deficiency.   Social Determinants of Health:   SDOH Screenings   Food Insecurity: No Food Insecurity (07/04/2023)  Housing: Low Risk  (07/04/2023)  Transportation Needs: No Transportation Needs (07/05/2023)  Utilities: Not At Risk (07/04/2023)  Depression (PHQ2-9): Low Risk  (04/21/2022)  Financial Resource Strain: Low Risk  (05/16/2023)   Received [facility]  Social Connections: Unknown (05/12/2023)   Received [facility]  Stress: No Stress Concern Present (06/12/2023)   Received from [facility]  Tobacco Use: High Risk (05/12/2024)     Additional history:  {Additional history obtained from patient's daughter at bedside {External records from outside source  obtained and reviewed including previous admission documents, cardiology notes and previous emergency department visit  Lab Tests:  I Ordered, and personally interpreted labs.  The pertinent results include:   Troponin T 92>> 83 review of previous troponin I values seems to be chronically and mildly elevated.  I am unsure of the conversion of scales between troponin I and troponin T. proBNP elevated at 1468  last comparative value normal in 2015  Imaging Studies:  I ordered imaging studies including CT abdomen pelvis with contrast, CT head and CT angiogram of the chest I independently visualized and interpreted imaging which showed no acute findings noted I agree with the radiologist interpretation  Cardiac Monitoring/ECG:  The patient was maintained on a cardiac monitor.  I personally viewed and interpreted the cardiac monitored which showed an underlying rhythm of:  Sinus rhythm with right bundle branch block and old inferior infarct similar to previous at a rate of 78    Medicines ordered and prescription drug management:  I ordered medication including  Medications  sodium chloride  0.9 % bolus 1,000 mL (1,000 mLs Intravenous New Bag/Given 05/14/24 1434)   for hypotension, feelings of near syncope Reevaluation of the patient after these medicines showed that the patient improved I have reviewed the patients home medicines and have made adjustments as needed  Test Considered:   echocardiogram for worsening heart function  Critical Interventions:   fluid resuscitation  Consultations Obtained: Dr. Franky for admission  Problem List / ED Course:     ICD-10-CM   1. Near syncope  R55       MDM: Patient here for reevaluation of feelings of near syncope versus vertigo.  Patient found to be mildly hypotensive.  His daughter states that he is chronically hypotensive.  He is on multiple medications for his heart including Entresto  and Imdur  he is also on 60 of Lasix  daily.   Certainly these medications could be contributing to some of his symptoms.  He may also benefit from MRI imaging to rule out central vertigo symptoms although vertigo is still less likely on my differential diagnosis.  Further patient may have worsening ejection fraction or need supportive care with something like midodrine.  He certainly seems to have some component of dehydration as his blood pressures did improve slightly with fluid resuscitation.  His orthostatic vital signs are negative.  Given the patient's extensive cardiac history 3 weeks of persistent feelings of lightheadedness when sitting up think he warrants further evaluation inpatient.  He is agreeable to admission today.   Dispostion:  After consideration of the diagnostic results and the patients response to treatment, I feel that the patent would benefit from admission.      Final diagnoses:  None    ED Discharge Orders     None          Arloa Chroman, PA-C 05/16/24 1319    Dasie Faden, MD 05/22/24 1048

## 2024-05-14 NOTE — ED Triage Notes (Signed)
 Was treated in ED 2 days ago for hypokalemia, with dizziness and vomiting. S/s have not improved, per pt's wife.

## 2024-05-14 NOTE — ED Notes (Addendum)
 Called lab about UA results, lab having to result urine manually at this time.

## 2024-05-14 NOTE — ED Notes (Signed)
 Pulse ox replaced per EDP request- placed on pt's ear for improved O2 sat 100%

## 2024-05-14 NOTE — Plan of Care (Signed)

## 2024-05-15 ENCOUNTER — Encounter (HOSPITAL_COMMUNITY): Payer: Self-pay | Admitting: Family Medicine

## 2024-05-15 ENCOUNTER — Telehealth (HOSPITAL_BASED_OUTPATIENT_CLINIC_OR_DEPARTMENT_OTHER): Payer: Self-pay

## 2024-05-15 DIAGNOSIS — Z515 Encounter for palliative care: Secondary | ICD-10-CM | POA: Diagnosis not present

## 2024-05-15 DIAGNOSIS — I130 Hypertensive heart and chronic kidney disease with heart failure and stage 1 through stage 4 chronic kidney disease, or unspecified chronic kidney disease: Secondary | ICD-10-CM | POA: Diagnosis not present

## 2024-05-15 DIAGNOSIS — R911 Solitary pulmonary nodule: Secondary | ICD-10-CM | POA: Diagnosis not present

## 2024-05-15 DIAGNOSIS — Z7189 Other specified counseling: Secondary | ICD-10-CM | POA: Diagnosis not present

## 2024-05-15 DIAGNOSIS — R55 Syncope and collapse: Secondary | ICD-10-CM | POA: Diagnosis not present

## 2024-05-15 DIAGNOSIS — I5022 Chronic systolic (congestive) heart failure: Secondary | ICD-10-CM | POA: Diagnosis not present

## 2024-05-15 DIAGNOSIS — I2510 Atherosclerotic heart disease of native coronary artery without angina pectoris: Secondary | ICD-10-CM | POA: Diagnosis not present

## 2024-05-15 DIAGNOSIS — N1831 Chronic kidney disease, stage 3a: Secondary | ICD-10-CM | POA: Diagnosis not present

## 2024-05-15 DIAGNOSIS — E1165 Type 2 diabetes mellitus with hyperglycemia: Secondary | ICD-10-CM | POA: Diagnosis not present

## 2024-05-15 DIAGNOSIS — Z7982 Long term (current) use of aspirin: Secondary | ICD-10-CM | POA: Diagnosis not present

## 2024-05-15 DIAGNOSIS — Z79899 Other long term (current) drug therapy: Secondary | ICD-10-CM | POA: Diagnosis not present

## 2024-05-15 DIAGNOSIS — R918 Other nonspecific abnormal finding of lung field: Secondary | ICD-10-CM | POA: Diagnosis present

## 2024-05-15 DIAGNOSIS — R42 Dizziness and giddiness: Secondary | ICD-10-CM | POA: Diagnosis not present

## 2024-05-15 LAB — GLUCOSE, CAPILLARY
Glucose-Capillary: 134 mg/dL — ABNORMAL HIGH (ref 70–99)
Glucose-Capillary: 138 mg/dL — ABNORMAL HIGH (ref 70–99)
Glucose-Capillary: 172 mg/dL — ABNORMAL HIGH (ref 70–99)
Glucose-Capillary: 312 mg/dL — ABNORMAL HIGH (ref 70–99)
Glucose-Capillary: 422 mg/dL — ABNORMAL HIGH (ref 70–99)

## 2024-05-15 LAB — CBC
HCT: 27.3 % — ABNORMAL LOW (ref 39.0–52.0)
Hemoglobin: 9.4 g/dL — ABNORMAL LOW (ref 13.0–17.0)
MCH: 34.1 pg — ABNORMAL HIGH (ref 26.0–34.0)
MCHC: 34.4 g/dL (ref 30.0–36.0)
MCV: 98.9 fL (ref 80.0–100.0)
Platelets: 113 K/uL — ABNORMAL LOW (ref 150–400)
RBC: 2.76 MIL/uL — ABNORMAL LOW (ref 4.22–5.81)
RDW: 16 % — ABNORMAL HIGH (ref 11.5–15.5)
WBC: 7.6 K/uL (ref 4.0–10.5)
nRBC: 0 % (ref 0.0–0.2)

## 2024-05-15 LAB — BASIC METABOLIC PANEL WITH GFR
Anion gap: 8 (ref 5–15)
BUN: 18 mg/dL (ref 8–23)
CO2: 30 mmol/L (ref 22–32)
Calcium: 8.9 mg/dL (ref 8.9–10.3)
Chloride: 100 mmol/L (ref 98–111)
Creatinine, Ser: 1.24 mg/dL (ref 0.61–1.24)
GFR, Estimated: 58 mL/min — ABNORMAL LOW (ref >60)
Glucose, Bld: 153 mg/dL — ABNORMAL HIGH (ref 70–99)
Potassium: 3 mmol/L — ABNORMAL LOW (ref 3.5–5.1)
Sodium: 138 mmol/L (ref 135–145)

## 2024-05-15 LAB — MAGNESIUM: Magnesium: 1.5 mg/dL — ABNORMAL LOW (ref 1.7–2.4)

## 2024-05-15 MED ORDER — NITROGLYCERIN 0.4 MG SL SUBL: 0.4000 mg | SUBLINGUAL_TABLET | SUBLINGUAL | Status: DC | PRN

## 2024-05-15 MED ORDER — SODIUM CHLORIDE 0.9 % IV SOLN: INTRAVENOUS | Status: AC

## 2024-05-15 MED ORDER — ACETAMINOPHEN 650 MG RE SUPP: 650.0000 mg | Freq: Four times a day (QID) | RECTAL | Status: DC | PRN

## 2024-05-15 MED ORDER — SENNOSIDES-DOCUSATE SODIUM 8.6-50 MG PO TABS: 1.0000 | ORAL_TABLET | Freq: Every evening | ORAL | Status: DC | PRN

## 2024-05-15 MED ORDER — CLOPIDOGREL BISULFATE 75 MG PO TABS
75.0000 mg | ORAL_TABLET | Freq: Every day | ORAL | Status: DC
  Administered 2024-05-15 – 2024-05-16 (×4): 75 mg via ORAL
  Filled 2024-05-15 (×2): qty 1

## 2024-05-15 MED ORDER — MAGNESIUM SULFATE 2 GM/50ML IV SOLN
2.0000 g | Freq: Once | INTRAVENOUS | Status: AC
  Administered 2024-05-15 (×2): 2 g via INTRAVENOUS
  Filled 2024-05-15: qty 50

## 2024-05-15 MED ORDER — ACETAMINOPHEN 325 MG PO TABS
650.0000 mg | ORAL_TABLET | Freq: Four times a day (QID) | ORAL | Status: DC | PRN
Start: 2024-05-15 — End: 2024-05-16

## 2024-05-15 MED ORDER — SODIUM CHLORIDE 0.9% FLUSH
3.0000 mL | Freq: Two times a day (BID) | INTRAVENOUS | Status: DC
  Administered 2024-05-15 – 2024-05-16 (×6): 3 mL via INTRAVENOUS

## 2024-05-15 MED ORDER — ROSUVASTATIN CALCIUM 20 MG PO TABS
20.0000 mg | ORAL_TABLET | Freq: Every day | ORAL | Status: DC
  Administered 2024-05-15 – 2024-05-16 (×4): 20 mg via ORAL
  Filled 2024-05-15 (×2): qty 1

## 2024-05-15 MED ORDER — TAMSULOSIN HCL 0.4 MG PO CAPS
0.4000 mg | ORAL_CAPSULE | Freq: Every day | ORAL | Status: DC
  Administered 2024-05-16 (×2): 0.4 mg via ORAL
  Filled 2024-05-15 (×2): qty 1

## 2024-05-15 MED ORDER — INSULIN GLARGINE-YFGN 100 UNIT/ML ~~LOC~~ SOLN
10.0000 [IU] | Freq: Every day | SUBCUTANEOUS | Status: DC
  Administered 2024-05-15 (×2): 10 [IU] via SUBCUTANEOUS
  Filled 2024-05-15 (×3): qty 0.1

## 2024-05-15 MED ORDER — INSULIN ASPART 100 UNIT/ML IJ SOLN: 0.0000 [IU] | Freq: Every day | INTRAMUSCULAR | Status: DC

## 2024-05-15 MED ORDER — INSULIN ASPART 100 UNIT/ML IJ SOLN
0.0000 [IU] | Freq: Three times a day (TID) | INTRAMUSCULAR | Status: DC
  Administered 2024-05-15: 7 [IU] via SUBCUTANEOUS
  Administered 2024-05-15: 1 [IU] via SUBCUTANEOUS
  Administered 2024-05-15: 7 [IU] via SUBCUTANEOUS
  Administered 2024-05-15: 1 [IU] via SUBCUTANEOUS
  Administered 2024-05-15 (×2): 9 [IU] via SUBCUTANEOUS
  Administered 2024-05-16: 2 [IU] via SUBCUTANEOUS
  Administered 2024-05-16: 5 [IU] via SUBCUTANEOUS
  Administered 2024-05-16: 2 [IU] via SUBCUTANEOUS
  Administered 2024-05-16: 5 [IU] via SUBCUTANEOUS

## 2024-05-15 MED ORDER — ASPIRIN 81 MG PO TBEC
81.0000 mg | DELAYED_RELEASE_TABLET | Freq: Every day | ORAL | Status: DC
  Administered 2024-05-15 – 2024-05-16 (×4): 81 mg via ORAL
  Filled 2024-05-15 (×2): qty 1

## 2024-05-15 MED ORDER — POTASSIUM CHLORIDE CRYS ER 20 MEQ PO TBCR
40.0000 meq | EXTENDED_RELEASE_TABLET | Freq: Two times a day (BID) | ORAL | Status: DC
  Administered 2024-05-15 – 2024-05-16 (×6): 40 meq via ORAL
  Filled 2024-05-15 (×3): qty 2

## 2024-05-15 MED ORDER — ENOXAPARIN SODIUM 40 MG/0.4ML IJ SOSY
40.0000 mg | PREFILLED_SYRINGE | INTRAMUSCULAR | Status: DC
  Administered 2024-05-15 – 2024-05-16 (×4): 40 mg via SUBCUTANEOUS
  Filled 2024-05-15 (×2): qty 0.4

## 2024-05-15 MED ORDER — TRIMETHOBENZAMIDE HCL 100 MG/ML IM SOLN
200.0000 mg | Freq: Four times a day (QID) | INTRAMUSCULAR | Status: DC | PRN
  Filled 2024-05-15: qty 2

## 2024-05-15 MED ORDER — ISOSORBIDE MONONITRATE ER 60 MG PO TB24: 60.0000 mg | ORAL_TABLET | Freq: Every day | ORAL | Status: DC

## 2024-05-15 MED ORDER — MAGNESIUM OXIDE -MG SUPPLEMENT 400 (240 MG) MG PO TABS
400.0000 mg | ORAL_TABLET | Freq: Every day | ORAL | Status: DC
  Administered 2024-05-15 – 2024-05-16 (×4): 400 mg via ORAL
  Filled 2024-05-15 (×2): qty 1

## 2024-05-15 NOTE — Consult Note (Cosign Needed Addendum)
 "                                                  Palliative Care Consult Note                                  Date: 05/15/2024   Patient Name: Keith Espinoza  DOB: 08-Jan-1941  MRN: [id number]  Age / Sex: 83 y.o., male  PCP: Keith Dorn LABOR, MD Referring Physician: Raenelle Coria, MD  Reason for Consultation: Establishing goals of care  HPI/Patient Profile: 83 y.o. male  with past medical history of coronary artery disease, chronic heart failure with mildly reduced EF, insulin -dependent type 2 diabetes, and peripheral artery disease status post right BKA who presented to the ED on 05/14/2024 and is admitted with lightheadedness, near syncopal episode, and orthostatic hypotension.   Palliative Medicine has been consulted for goals of care discussions. Patient and family are faced with anticipatory care needs and complex medical decision making.   Clinical Assessment and Goals of Care:   Extensive chart review has been completed prior to meeting with patient/family including labs, vital signs, imaging, progress/consult notes, orders, medications and available advance directive documents.  I met with patient and his daughter/Keith Espinoza at bedside to discuss diagnosis, prognosis, GOC, disposition, and options. Patient is out of bed to the recliner, eating lunch. He reports light-headedness has improved. No new complaints.   I introduced Palliative Medicine as specialized medical care for people living with serious illness. It focuses on providing relief from the symptoms and stress of a serious illness.   Created space and opportunity for patient and family to express thoughts and feelings regarding current medical situation. Values and goals of care were attempted to be elicited.  Life Review: Patient is widowed. He has 1 daughter/Keith Espinoza.   Functional Status: Patient lives at home alone. Daughter lives next to him. Prior to admission, he was ambulatory with a walker. He is independent  with ADLs.   Discussion: We discussed patient's current illness and what it means in the larger context of his ongoing co-morbidities. Current clinical status was reviewed. Natural disease trajectory of chronic illness was discussed.  We discussed code status at length. Patient is currently documented as DNR with limited interventions. Provided education on DNR status understanding evidenced based poor outcomes in similar hospitalized patients, as the cause of the arrest is likely associated with chronic/terminal disease rather than a reversible acute cardio-pulmonary event.  I explained that DNR only comes into effect in the event of cardiac arrest, and is a protective measure to keep us  from harming the patient in their last moments of life.    Patient has expressed that he would want to receive CPR, but not intubation. I explained that patients usually require intubation during a resuscitation event (code blue). After further discussion, daughter requests that code status be changed back to full code for now.    We discussed the difference between full scope care versus limited interventions (treat the treatable) versus comfort care. The MOST form was introduced and discussed.  For now, patient and daughter remain open to all offered and available medical interventions to prolong life. They are open to ongoing GOC discussions and understand goals may change pending patient's clinical course. We did discuss the  limitations of medical interventions to prolong quality of life when the body fails to thrive.  Discussed the importance of continued conversation with the medical team regarding overall plan of care and treatment options, ensuring decisions are within the context of the patients values and GOCs.  Questions and concerns addressed. Hard choices book and PMT contact info given.    Review of Systems  Neurological:  Positive for light-headedness.    Objective:   Primary  Diagnoses: Present on Admission:  Coronary artery disease  Chronic heart failure with mildly reduced ejection fraction (HFmrEF, 41-49%) (HCC)  Lung nodules  CKD stage 3a, GFR 45-59 ml/min [facility])   Physical Exam Vitals reviewed.  Constitutional:      General: He is not in acute distress.    Comments: Chronically ill-appearing  Cardiovascular:     Rate and Rhythm: Normal rate.  Pulmonary:     Effort: Pulmonary effort is normal.  Neurological:     Mental Status: He is alert and oriented to person, place, and time.     Palliative Assessment/Data: PPS 50%     Assessment & Plan:   SUMMARY OF RECOMMENDATIONS   Code status changed to full code (from DNR-Limited) Full scope of care PMT will continue to follow  Primary Decision Maker: PATIENT  Existing Vynca/ACP Documentation: None  Daughter reports she is HCPOA - I have asked her to bring this document to the hospital  Symptom Management:  Per attending  Prognosis:  Unable to determine  Discharge Planning:  To Be Determined     Thank you for allowing us  to participate in the care of Keith Espinoza   Time Total: 78 minutes  Detailed review of medical records (labs, imaging, vital signs), medically appropriate exam, discussed with treatment team, counseling and education to patient, family, & staff, documenting clinical information, coordination of care.   Signed by: Keith Loll, NP Palliative Medicine Team  Team Phone # [phone]  For individual providers, please see AMION                "

## 2024-05-15 NOTE — H&P (Signed)
 History and Physical    Keith Espinoza FMW:[id number] DOB: 1941/08/01 DOA: 05/14/2024  PCP: Charlott Dorn LABOR, MD   Patient coming from: Home   Chief Complaint: Lightheaded, near-syncope when sitting up   HPI: Keith Espinoza is a 83 y.o. male with medical history significant for CAD, chronic HFmrEF, insulin -dependent diabetes mellitus, and PAD who presents for evaluation of lightheadedness and near syncope when sitting up.  Patient has had similar symptoms previously which improved when metoprolol  was decreased, resolved when metoprolol  was discontinued, but he began having more chest pain and was restarted on a lower dose metoprolol .  Ranexa  was adjusted last month when his symptoms became troublesome again.  Patient describes feeling fine while supine but feels as though he is about to pass out when he sits up.  He has had some nausea and vomiting episodes associated with this but no abdominal pain or fever.  He denies a room spinning sensation or focal numbness or weakness.  [facility] ED Course: Upon arrival to the ED, patient is found to be afebrile and saturating well on room air with normal HR and SBP in the 80s.  Labs are most notable for glucose 353, creatinine 1.46, hemoglobin 10.3, troponin 92, and proBNP 1468.  EKG reveals sinus rhythm with first-degree AV nodal block, RBBB, and prolonged QT.  Patient was given a liter of NS and meclizine  in the ED.  He was transferred to [facility] for admission.  Review of Systems:  All other systems reviewed and apart from HPI, are negative.  Past Medical History:  Diagnosis Date   Allergic rhinitis    Anemia    Aortic stenosis    Arthritis    Basal cell carcinoma 11/01/2019    bcc left chest treatment TX cx3 20fu    CHF (congestive heart failure) (HCC)    Chronic leg pain    right   Chronic lower back pain    Coronary artery disease    a. Stenting to RCA 2004; staged DES to LAD and Cx 2004. DES  to [facility] 2012. b. DES to mCx, PTCA to dCx 11/2011. c. Lateral wall MI 2013 s/p PTCA to distal Cx & DES to mid OM2 11/2011. d. Low risk nuc 04/2014, EF wnl.   COVID-19    Diabetes mellitus    Insulin  dependent   Diabetic neuropathy (HCC)    MILD   Diverticulosis    Dysrhythmia    Bertrum syndrome    Gout    right wrist; right foot; right elbow; have had it since 1970's   H/O hiatal hernia    Heart murmur    History of echocardiogram    aortic sclerosis per echo 12/09 EF 65%, otherwise normal   History of hemorrhoids    BLEEDING   History of kidney stones    h/o   Hypertension    Diagnosed 1995    Myocardial infarction [facility])    Pancreatic pseudocyst    a. s/p remote drainage 2006.   Thrombocytopenia (HCC)    Seen on oldest labs in system from 2004   Vitamin B 12 deficiency    orally replaced    Past Surgical History:  Procedure Laterality Date   ABDOMINAL AORTOGRAM W/LOWER EXTREMITY Bilateral 08/08/2020   Procedure: ABDOMINAL AORTOGRAM W/LOWER EXTREMITY;  Surgeon: Eliza Lonni RAMAN, MD;  Location: [facility] INVASIVE CV LAB;  Service: Cardiovascular;  Laterality: Bilateral;   AMPUTATION Right 12/12/2020   Procedure: RIGHT BELOW KNEE AMPUTATION;  Surgeon: Harden Jerona GAILS,  MD;  Location: [facility] OR;  Service: Orthopedics;  Laterality: Right;   BACK SURGERY     total of 3 times S/P fall    CARPAL TUNNEL RELEASE Bilateral    CHOLECYSTECTOMY  1990's   CIRCUMCISION N/A 10/12/2023   Procedure: Dorsal slit;  Surgeon: [name], MD;  Location: [facility] ORS;  Service: Urology;  Laterality: N/A;   COLONOSCOPY     COLONOSCOPY Left 07/08/2023   Procedure: COLONOSCOPY;  Surgeon: San Sandor GAILS, DO;  Location: [facility] ENDOSCOPY;  Service: Gastroenterology;  Laterality: Left;   CORONARY ANGIOPLASTY  11/11/11   CORONARY ANGIOPLASTY WITH STENT PLACEMENT  09/30/2011   1 then; makes a total of 4   CORONARY ANGIOPLASTY WITH STENT PLACEMENT  11/11/11   1; makes a total of 5   ESOPHAGOGASTRODUODENOSCOPY  Left 07/08/2023   Procedure: ESOPHAGOGASTRODUODENOSCOPY (EGD);  Surgeon: San Sandor GAILS, DO;  Location: [facility] ENDOSCOPY;  Service: Gastroenterology;  Laterality: Left;   HEMOSTASIS CLIP PLACEMENT  07/08/2023   Procedure: HEMOSTASIS CLIP PLACEMENT;  Surgeon: San Sandor GAILS, DO;  Location: [facility] ENDOSCOPY;  Service: Gastroenterology;;   HOT HEMOSTASIS N/A 07/08/2023   Procedure: HOT HEMOSTASIS (ARGON PLASMA COAGULATION/BICAP);  Surgeon: San Sandor GAILS, DO;  Location: [facility] ENDOSCOPY;  Service: Gastroenterology;  Laterality: N/A;   INGUINAL HERNIA REPAIR  2003   right   JOINT REPLACEMENT Right 04/03/2002   hip replacment   KNEE ARTHROSCOPY  1990's   left   LEFT HEART CATH AND CORONARY ANGIOGRAPHY N/A 04/25/2023   Procedure: LEFT HEART CATH AND CORONARY ANGIOGRAPHY;  Surgeon: Anner Alm ORN, MD;  Location: [facility] INVASIVE CV LAB;  Service: Cardiovascular;  Laterality: N/A;   LEFT HEART CATHETERIZATION WITH CORONARY ANGIOGRAM N/A 09/30/2011   Procedure: LEFT HEART CATHETERIZATION WITH CORONARY ANGIOGRAM;  Surgeon: Candyce GORMAN Reek, MD;  Location: [facility] CATH LAB;  Service: Cardiovascular;  Laterality: N/A;  possible PCI   LEFT HEART CATHETERIZATION WITH CORONARY ANGIOGRAM N/A 11/15/2011   Procedure: LEFT HEART CATHETERIZATION WITH CORONARY ANGIOGRAM;  Surgeon: Candyce GORMAN Reek, MD;  Location: [facility] CATH LAB;  Service: Cardiovascular;  Laterality: N/A;   PERCUTANEOUS CORONARY STENT INTERVENTION (PCI-S)  09/30/2011   Procedure: PERCUTANEOUS CORONARY STENT INTERVENTION (PCI-S);  Surgeon: Candyce GORMAN Reek, MD;  Location: [facility] CATH LAB;  Service: Cardiovascular;;   PERCUTANEOUS CORONARY STENT INTERVENTION (PCI-S) N/A 11/11/2011   Procedure: PERCUTANEOUS CORONARY STENT INTERVENTION (PCI-S);  Surgeon: Candyce GORMAN Reek, MD;  Location: [facility] CATH LAB;  Service: Cardiovascular;  Laterality: N/A;   PERIPHERAL VASCULAR BALLOON ANGIOPLASTY Right 08/08/2020   Procedure: PERIPHERAL VASCULAR BALLOON ANGIOPLASTY;  Surgeon:  Eliza Lonni GORMAN, MD;  Location: [facility] INVASIVE CV LAB;  Service: Cardiovascular;  Laterality: Right;  Posterior tibial    SHOULDER SURGERY Right    X 2   STUMP REVISION Right 01/09/2021   Procedure: REVISION RIGHT BELOW KNEE AMPUTATION;  Surgeon: Harden Jerona GAILS, MD;  Location: [facility] OR;  Service: Orthopedics;  Laterality: Right;   TONSILLECTOMY  ~ 1948   TOTAL HIP REVISION Right 04/13/2019   Procedure: RIGHT TOTAL HIP REVISION-POSTERIOR  APPROACH LATERAL;  Surgeon: Barbarann Oneil BROCKS, MD;  Location: [facility] OR;  Service: Orthopedics;  Laterality: Right;   TOTAL KNEE ARTHROPLASTY Left 07/23/2016   Procedure: LEFT TOTAL KNEE ARTHROPLASTY;  Surgeon: Oneil BROCKS Barbarann, MD;  Location: [facility] OR;  Service: Orthopedics;  Laterality: Left;    Social History:   reports that he has quit smoking. His smoking use included cigarettes. His smokeless tobacco use includes chew. He reports that he does  not drink alcohol and does not use drugs.  Allergies  Allergen Reactions   Simvastatin Other (See Comments)    SEVERE MYALGIAS    Zetia [Ezetimibe] Other (See Comments)    MYALGIAS   Dilaudid  [Hydromorphone  Hcl] Other (See Comments)    hallucination    Family History  Problem Relation Age of Onset   Diabetes Mother    Hyperlipidemia Mother    Hypertension Mother    Cancer Father    Hypertension Father    Diabetes Sister    Hypertension Sister    Cancer Brother    Heart attack Neg Hx      Prior to Admission medications   Medication Sig Start Date End Date Taking? Authorizing Provider  acetaminophen  (TYLENOL ) 500 MG tablet Take 2 tablets (1,000 mg total) by mouth every 6 (six) hours. Patient taking differently: Take 1,000 mg by mouth as needed. 10/12/23   [name], MD  allopurinol  (ZYLOPRIM ) 300 MG tablet Take 300 mg by mouth daily.    [provider]  Ascorbic Acid (VITAMIN C PO) Take 1,000 mcg by mouth at bedtime.    [provider]  aspirin  EC 81 MG tablet Take 81 mg by mouth daily.  Swallow whole.    [provider]  Cholecalciferol  (VITAMIN D3) 50 MCG (2000 UT) TABS Take 2,000 Units by mouth at bedtime.    [provider]  clopidogrel  (PLAVIX ) 75 MG tablet Take 1 tablet (75 mg total) by mouth daily. 08/12/23   [name], PA-C  Continuous Glucose Sensor (DEXCOM G7 SENSOR) MISC 1 Device by Does not apply route as directed. 12/05/23   [name], [name], MD  Cyanocobalamin  (B-12) 1000 MCG TABS Take 1,000 mcg by mouth daily.    [provider]  furosemide  (LASIX ) 20 MG tablet Take 1 tablet (20 mg total) by mouth daily. Patient taking differently: Take 40 mg by mouth daily. 07/10/23   [name], MD  gabapentin  (NEURONTIN ) 300 MG capsule Take 600 mg by mouth at bedtime.    [provider]  gabapentin  (NEURONTIN ) 400 MG capsule Take 1 capsule (400 mg total) by mouth daily. 07/10/23   [name], MD  insulin  aspart (NOVOLOG  FLEXPEN) 100 UNIT/ML FlexPen Max daily 30 units 12/30/23   [name], [name], MD  insulin  degludec (TRESIBA  FLEXTOUCH) 100 UNIT/ML FlexTouch Pen Inject 10 Units into the skin daily. 12/30/23   [name], [name], MD  Insulin  Pen Needle 32G X 4 MM MISC 1 Device by Does not apply route in the morning, at noon, in the evening, and at bedtime. 12/30/23   [name], [name], MD  isosorbide  mononitrate (IMDUR ) 60 MG 24 hr tablet Take 1 tablet (60 mg total) by mouth daily. 08/12/23   [name], PA-C  JARDIANCE  25 MG TABS tablet TAKE 1 TABLET BY MOUTH DAILY 05/14/24   [name], [name], MD  Magnesium  400 MG CAPS Take 400 mg by mouth at bedtime.    [provider]  metFORMIN  (GLUCOPHAGE ) 1000 MG tablet Take 1 tablet (1,000 mg total) by mouth 2 (two) times daily. 12/30/23   [name], [name], MD  metoprolol  succinate (TOPROL -XL) 25 MG 24 hr tablet Take 12.5 mg by mouth daily.    [provider]  nitroGLYCERIN  (NITROSTAT ) 0.4 MG SL tablet Place 1 tablet (0.4 mg  total) under the tongue every 5 (five) minutes x 3 doses as needed for chest pain. 04/16/24   [name], MD  Omega-3 Fatty Acids (FISH OIL) 500  MG CAPS Take 500 mg by mouth daily.    [provider]  pantoprazole  (PROTONIX ) 40 MG tablet Take 1 tablet (40 mg total) by mouth 2 (two) times daily. 07/10/23   [name], MD  pioglitazone  (ACTOS ) 30 MG tablet Take 1 tablet (30 mg total) by mouth daily. 12/30/23   [name], [name], MD  Potassium Chloride  ER 20 MEQ TBCR Take 1 tablet (20 mEq total) by mouth daily. 08/12/23   [name], PA-C  ranolazine  (RANEXA ) 500 MG 12 hr tablet Take 1 tablet (500 mg total) by mouth 2 (two) times daily. 03/20/24   [name], MD  rosuvastatin  (CRESTOR ) 20 MG tablet Take 1 tablet (20 mg total) by mouth daily. 08/12/23   [name], PA-C  sacubitril -valsartan  (ENTRESTO ) 24-26 MG Take 1 tablet by mouth 2 (two) times daily. 08/12/23   [name], PA-C  senna-docusate (SENOKOT-S) 8.6-50 MG tablet Take 1 tablet by mouth at bedtime. Patient taking differently: Take 1 tablet by mouth 2 (two) times daily. 02/03/21   [name], MD  tamsulosin  (FLOMAX ) 0.4 MG CAPS capsule Take 1 capsule (0.4 mg total) by mouth daily after supper. Patient taking differently: Take 0.4 mg by mouth in the morning and at bedtime. 07/09/23   Dameron, Marisa, DO  Turmeric (QC TUMERIC COMPLEX PO) Take 500 mg by mouth at bedtime.    [provider]    Physical Exam: Vitals:   05/14/24 2300 05/14/24 2313 05/14/24 2314 05/14/24 2318  BP:   (!) 107/54   Pulse:    73  Resp: (!) 24 (!) 25 19 19   Temp:   98.1 F (36.7 C)   TempSrc:   Oral   SpO2:   100% 100%    Constitutional: NAD, no pallor or diaphoresis   Eyes: PERTLA, lids and conjunctivae normal ENMT: Mucous membranes are moist. Posterior pharynx clear of any exudate or lesions.   Neck: supple, no masses  Respiratory: no wheezing, no crackles. No accessory muscle use.  Cardiovascular: S1 & S2 heard,  regular rate and rhythm. No extremity edema.  Abdomen: No tenderness, soft. Bowel sounds active.  Musculoskeletal: no clubbing / cyanosis. S/p right BKA.   Skin: no significant rashes, lesions, ulcers. Warm, dry, well-perfused. Neurologic: CN 2-12 grossly intact. Moving all extremities. Alert and oriented.  Psychiatric: Calm. Cooperative.    Labs and Imaging on Admission: I have personally reviewed following labs and imaging studies  CBC: Recent Labs  Lab 05/12/24 1125 05/14/24 1416  WBC 6.9 11.3*  NEUTROABS 5.3  --   HGB 10.4* 10.3*  HCT 30.2* 30.1*  MCV 96.8 100.0  PLT 147* 132*   Basic Metabolic Panel: Recent Labs  Lab 05/12/24 1125 05/12/24 1221 05/14/24 1416  NA 139  --  137  K 2.7*  --  3.7  CL 97*  --  97*  CO2 25  --  22  GLUCOSE 283*  --  353*  BUN 20  --  21  CREATININE 1.49*  --  1.46*  CALCIUM  9.8  --  10.0  MG  --  1.6* 1.7   GFR: CrCl cannot be calculated (Unknown ideal weight.). Liver Function Tests: Recent Labs  Lab 05/12/24 1125 05/14/24 1416  AST 27 36  ALT 13 17  ALKPHOS 73 105  BILITOT 0.6 0.5  PROT 6.5 6.6  ALBUMIN  3.7 3.5   Recent Labs  Lab 05/14/24 1416  LIPASE 31   No results for input(s): AMMONIA in the last 168 hours.  Coagulation Profile: No results for input(s): INR, PROTIME in the last 168 hours. Cardiac Enzymes: No results for input(s): CKTOTAL, CKMB, CKMBINDEX, TROPONINI in the last 168 hours. BNP (last 3 results) Recent Labs    05/14/24 1416  PROBNP 1,468.0*   HbA1C: No results for input(s): HGBA1C in the last 72 hours. CBG: Recent Labs  Lab 05/15/24 0036  GLUCAP 172*   Lipid Profile: No results for input(s): CHOL, HDL, LDLCALC, TRIG, CHOLHDL, LDLDIRECT in the last 72 hours. Thyroid  Function Tests: No results for input(s): TSH, T4TOTAL, FREET4, T3FREE, THYROIDAB in the last 72 hours. Anemia Panel: No results for input(s): VITAMINB12, FOLATE, FERRITIN, TIBC,  IRON , RETICCTPCT in the last 72 hours. Urine analysis:    Component Value Date/Time   COLORURINE YELLOW 05/14/2024 1617   APPEARANCEUR CLEAR 05/14/2024 1617   LABSPEC 1.010 05/14/2024 1617   PHURINE 5.0 05/14/2024 1617   GLUCOSEU >=500 (A) 05/14/2024 1617   HGBUR NEGATIVE 05/14/2024 1617   BILIRUBINUR NEGATIVE 05/14/2024 1617   KETONESUR NEGATIVE 05/14/2024 1617   PROTEINUR NEGATIVE 05/14/2024 1617   NITRITE NEGATIVE 05/14/2024 1617   LEUKOCYTESUR NEGATIVE 05/14/2024 1617   Sepsis Labs: @LABRCNTIP (procalcitonin:4,lacticidven:4) ) Recent Results (from the past 240 hours)  Urine Culture     Status: Abnormal   Collection Time: 05/12/24 11:25 AM   Specimen: Urine, Random  Result Value Ref Range Status   Specimen Description   Final    URINE, RANDOM Performed at [facility], [address] [place], [place] [address]    Special Requests   Final    NONE Reflexed from [id number] Performed at [facility], [address], [place], [place] [address]    Culture 60,000 COLONIES/mL ENTEROCOCCUS FAECALIS (A)  Final   Report Status 05/14/2024 FINAL  Final   Organism ID, Bacteria ENTEROCOCCUS FAECALIS (A)  Final      Susceptibility   Enterococcus faecalis - MIC*    AMPICILLIN <=2 SENSITIVE Sensitive     NITROFURANTOIN <=16 SENSITIVE Sensitive     VANCOMYCIN 1 SENSITIVE Sensitive     * 60,000 COLONIES/mL ENTEROCOCCUS FAECALIS     Radiological Exams on Admission: CT ABDOMEN PELVIS W CONTRAST Result Date: 05/14/2024 CLINICAL DATA:  Abdominal pain, postop (circumcision) EXAM: CT ABDOMEN AND PELVIS WITH CONTRAST TECHNIQUE: Multidetector CT imaging of the abdomen and pelvis was performed using the standard protocol following bolus administration of intravenous contrast. RADIATION DOSE REDUCTION: This exam was performed according to the departmental dose-optimization program which includes automated exposure control, adjustment of the mA and/or kV  according to patient size and/or use of iterative reconstruction technique. CONTRAST:  80mL OMNIPAQUE  IOHEXOL  350 MG/ML SOLN COMPARISON:  MRI abdomen December 23, 2022 FINDINGS: Lower chest: Please refer to same day chest CT. Atherosclerotic calcifications of coronary arteries. Lower chest: No acute abnormality. Hepatobiliary: No focal liver abnormality is seen. Cholecystectomy. No biliary mild dilation of the biliary system likely due to reservoir phenomenon. Pancreas: Atrophic changes of the pancreas. No pancreatic ductal dilatation or surrounding inflammatory changes. Spleen: Normal in size without focal abnormality. Adrenals/Urinary Tract: Left adrenal nodule measuring 1.6 cm with postcontrast Hounsfield unit of 51, stable to prior MRI likely representing a lipid rich adenoma according to prior MR characteristics. Bilateral nonobstructive nephrolithiasis of kidneys measuring up to 6 mm on the right and 5 mm on the left without hydronephrosis. Atrophic changes of the kidneys. Bilateral simple renal cortical cysts which does not require imaging follow-up. Bladder is unremarkable. Stomach/Bowel: Stomach is within normal limits. D3 duodenal lipoma measuring  1.3 cm. Appendix appears normal. Sigmoid diverticulosis without diverticulitis. No evidence of bowel wall thickening, distention, or inflammatory changes. Vascular/Lymphatic: Severe bilateral simple renal cortical cysts atherosclerotic calcifications. No enlarged abdominal or pelvic lymph nodes. Reproductive: Prostatomegaly. Pelvic images partially obscured by streak artifact from right hip arthroplasty. Other: No abdominal wall hernia or abnormality. No abdominopelvic ascites. Musculoskeletal: Right hip arthroplasty. Left femoral hardware partially visualized. Multilevel degenerative changes of the spine. IMPRESSION: No acute abnormality in the abdomen or pelvis. Left adrenal nodule, stable previously described as benign on prior MRI. Cholecystectomy and mild  biliary dilatation (reservoir phenomenon). D3 duodenal lipoma. Atrophic changes of the pancreas. Electronically Signed   By: Megan  Zare M.D.   On: 05/14/2024 19:44   CT Head Wo Contrast Result Date: 05/14/2024 CLINICAL DATA:  Hypokalemia with dizziness and vomiting. EXAM: CT HEAD WITHOUT CONTRAST TECHNIQUE: Contiguous axial images were obtained from the base of the skull through the vertex without intravenous contrast. RADIATION DOSE REDUCTION: This exam was performed according to the departmental dose-optimization program which includes automated exposure control, adjustment of the mA and/or kV according to patient size and/or use of iterative reconstruction technique. COMPARISON:  July 26, 2016 FINDINGS: Brain: There is generalized cerebral atrophy with widening of the extra-axial spaces and ventricular dilatation. There are areas of decreased attenuation within the white matter tracts of the supratentorial brain, consistent with microvascular disease changes. A chronic left occipital lobe infarct is seen. Vascular: Moderate severity bilateral cavernous carotid artery calcification is noted. Skull: Normal. Negative for fracture or focal lesion. Sinuses/Orbits: No acute finding. Other: None. IMPRESSION: 1. Generalized cerebral atrophy with chronic white matter small vessel ischemic changes. 2. Chronic left occipital lobe infarct. 3. No acute intracranial abnormality. Electronically Signed   By: Suzen Dials M.D.   On: 05/14/2024 19:15   CT Angio Chest PE W and/or Wo Contrast Result Date: 05/14/2024 CLINICAL DATA:  Evaluate for pulmonary embolism, high probability EXAM: CT ANGIOGRAPHY CHEST WITH CONTRAST TECHNIQUE: Multidetector CT imaging of the chest was performed using the standard protocol during bolus administration of intravenous contrast. Multiplanar CT image reconstructions and MIPs were obtained to evaluate the vascular anatomy. RADIATION DOSE REDUCTION: This exam was performed according to  the departmental dose-optimization program which includes automated exposure control, adjustment of the mA and/or kV according to patient size and/or use of iterative reconstruction technique. CONTRAST:  80mL OMNIPAQUE  IOHEXOL  350 MG/ML SOLN COMPARISON:  None Available. FINDINGS: Cardiovascular: Satisfactory opacification of the pulmonary arteries to the segmental level. No evidence of pulmonary embolism. Mildly enlarged pulmonary artery measuring up to 3 cm. No aortic aneurysm. Normal heart size. No pericardial effusion. Mediastinum/Nodes: No enlarged mediastinal, hilar, or axillary lymph nodes. Thyroid  gland, trachea, and esophagus demonstrate no significant findings. Lungs/Pleura: Left lower lobe micronodule (9/75 cluster of bronchocentric nodules in right lung base measuring up to 9 mm (9/86 multiple foci of mucus stasis/micro nodules in left lung space. Bronchial and bronchiolar wall thickening. Upper Abdomen: Left adrenal nodule measuring 1.5 cm likely lipid rich adenoma, Hounsfield units 4 on postcontrast. Bilateral nonobstructive nephrolithiasis. No hydronephrosis. Cholecystectomy. Musculoskeletal: No acute osseous abnormality prior degenerative changes of the spine. Review of the MIP images confirms the above findings. IMPRESSION: No suspicious finding to suggest pulmonary embolism. Multiple pulmonary nodules predominantly in bilateral lung base, right greater than left measuring up to 9 mm. Follow-up according to guidelines below. Left adrenal nodule measuring 1.5 cm. Recommend follow-up with dedicated CT abdomen adrenal protocol with washout if clinically warranted. Non-contrast chest CT at 3-6  months is recommended. If the nodules are stable at time of repeat CT, then future CT at 18-24 months (from today's scan) is considered optional for low-risk patients, but is recommended for high-risk patients. This recommendation follows the consensus statement: Guidelines for Management of Incidental Pulmonary  Nodules Detected on CT Images: From the Fleischner Society 2017; Radiology 2017; 284:228-243. Electronically Signed   By: Megan  Zare M.D.   On: 05/14/2024 19:11    EKG: Independently reviewed. SR, 1st degree AV block, RBBB.   Assessment/Plan   1. Lightheadedness  - Feels fine when supine but becomes lightheaded when sitting up; he was hypotensive in ED but orthostatic vitals were negative  - Hold Toprol , Entresto , Lasix , Ranexa , and Imdur  initially, continue cardiac monitoring, continue gentle IVF hydration, update echocardiogram    2. Chronic HFmrEF  - EF was 40-45% on echo from September 2024  - Appears compensated  - Holding diuretics, beta-blocker, and Entresto  initially given hypotensive episodes and lightheadedness   3. CAD - Occasional angina relieved with NTG, not a candidate for PCI or bypass per recent cardiology notes     - Continue ASA, Plavix , Crestor ; holding Imdur , Ranexa , and Toprol  for now in light of hypotension and lightheadedness    4. CKD 3A  - SCr is 1.46 on admission, baseline appears closer to 1.1 or 1.2  - Renally-dose medications, repeat chem panel in am   5. Type II DM  - A1c was 7.7% in March 2025  - Check CBGs, use basal insulin  and sliding-scale correctional    6. Lung nodules  - Noted incidentally on CT  - Outpatient follow-up advised    DVT prophylaxis: Lovenox  Code Status: DNR  Level of Care: Level of care: Progressive Family Communication: None present  Disposition Plan:  Patient is from: Home  Anticipated d/c is to: TBD Anticipated d/c date is: 05/16/24  Patient currently: Pending TTE, PT eval, clinical improvement  Consults called: None  Admission status: Observation     Evalene GORMAN Sprinkles, MD [facility]  05/15/2024, 1:55 AM

## 2024-05-15 NOTE — Care Management Obs Status (Signed)
 MEDICARE OBSERVATION STATUS NOTIFICATION   Patient Details  Name: Keith Espinoza MRN: [id number] Date of Birth: 1941-05-07   Medicare Observation Status Notification Given:  Yes    Vonzell Arrie Sharps 05/15/2024, 9:22 AM

## 2024-05-15 NOTE — Progress Notes (Signed)
 PROGRESS NOTE    Keith Espinoza  FMW:[id number] DOB: 05-29-1941 DOA: 05/14/2024 PCP: Charlott Dorn LABOR, MD    Brief Narrative:  83 year old with history of coronary artery disease, chronic heart failure with mildly reduced ejection fraction, insulin -dependent type 2 diabetes, peripheral artery disease status post right BKA presents with lightheadedness and near syncopal episode, orthostatic hypotension.  Patient with history of orthostatic hypotension, initially improved with discontinuing metoprolol  however now recurred.  Patient to the point cannot get up and walk.  He has become wheelchair dependent, however still gets dizzy on sitting up.  At the emergency room patient is afebrile, on room air.  Glucose 353.  EKG with sinus rhythm and first-degree AV block.  Patient was given IV fluids and meclizine .  Transferred to hospital for admission due to significant symptoms.  Subjective: Patient seen and examined.  At rest he denies any complaints.  He is very worried about capacity to move and sit up.  Patient is aware about him having various problems and some of them are not able to be treated.  he is yet to mobilize.  Telemetry monitor with sinus rhythm.   Assessment & Plan:   Dizziness and lightheadedness, near syncope: Patient with chronic hypotension.  Currently negative for orthostatic, however symptomatology consistent with orthostatic hypotension.  Gentle IV fluids.  Hold Toprol , Entresto , Lasix  and Ranexa  as well as nitrates.  Continue cardiac monitoring.  Echocardiogram today.  Will allow patient to have supine hypertension in order to compensate for orthostatics.  Mobilize with PT OT and monitor blood pressures.  Chronic heart failure with mildly reduced ejection fraction: Known EF 40 to 45%.  Currently euvolemic.  Holding diuretics and Entresto .  Coronary artery disease: Occasional angina relieved with nitroglycerin .  Not a candidate for PCI or bypass per recent cardiology  notes.  He is on aspirin , Plavix , Crestor  that is continued.  If allowed with blood pressure, will resume beta-blockers and nitrates gradually.  CKD stage IIIa: At about baseline.  Type 2 diabetes: On insulin  at home.  Continue.  Lung nodules: Multiple lung nodules with maximum diameter 9 mm bilateral lung base.  Outpatient follow-up in repeat CT scan in 1 year.   DVT prophylaxis: enoxaparin  (LOVENOX ) injection 40 mg Start: 05/15/24 1000   Code Status: DNR with limited intervention Family Communication: Daughter at the bedside Disposition Plan: Status is: Observation The patient will require care spanning > 2 midnights and should be moved to inpatient because: Persistent symptoms, medication adjustment     Consultants:  None  Procedures:  None  Antimicrobials:  None     Objective: Vitals:   05/14/24 2318 05/15/24 0429 05/15/24 0730 05/15/24 1110  BP:  107/65 119/62 (!) 123/55  Pulse: 73 66 69 80  Resp: 19 20 20 20   Temp:  97.8 F (36.6 C) 98.5 F (36.9 C) 98.5 F (36.9 C)  TempSrc:  Oral Oral Oral  SpO2: 100% 100% 100% 100%    Intake/Output Summary (Last 24 hours) at 05/15/2024 1313 Last data filed at 05/15/2024 0504 Gross per 24 hour  Intake 1363.11 ml  Output 1325 ml  Net 38.11 ml   There were no vitals filed for this visit.  Examination:  General exam: Appears calm and comfortable  Chronically sick looking.  Frail and debilitated.  Not in any distress. Respiratory system: Clear to auscultation. Respiratory effort normal.  No added sounds. Cardiovascular system: S1 & S2 heard, RRR.  Gastrointestinal system: Soft.  Nontender.  Bowel sound present. Central nervous system: Alert  and oriented. No focal neurological deficits.  Flat affect.  Anxious. Extremities: Symmetric 5 x 5 power.  Right BKA present.    Data Reviewed: I have personally reviewed following labs and imaging studies  CBC: Recent Labs  Lab 05/12/24 1125 05/14/24 1416 05/15/24 0354   WBC 6.9 11.3* 7.6  NEUTROABS 5.3  --   --   HGB 10.4* 10.3* 9.4*  HCT 30.2* 30.1* 27.3*  MCV 96.8 100.0 98.9  PLT 147* 132* 113*   Basic Metabolic Panel: Recent Labs  Lab 05/12/24 1125 05/12/24 1221 05/14/24 1416 05/15/24 0354  NA 139  --  137 138  K 2.7*  --  3.7 3.0*  CL 97*  --  97* 100  CO2 25  --  22 30  GLUCOSE 283*  --  353* 153*  BUN 20  --  21 18  CREATININE 1.49*  --  1.46* 1.24  CALCIUM  9.8  --  10.0 8.9  MG  --  1.6* 1.7 1.5*   GFR: CrCl cannot be calculated (Unknown ideal weight.). Liver Function Tests: Recent Labs  Lab 05/12/24 1125 05/14/24 1416  AST 27 36  ALT 13 17  ALKPHOS 73 105  BILITOT 0.6 0.5  PROT 6.5 6.6  ALBUMIN  3.7 3.5   Recent Labs  Lab 05/14/24 1416  LIPASE 31   No results for input(s): AMMONIA in the last 168 hours. Coagulation Profile: No results for input(s): INR, PROTIME in the last 168 hours. Cardiac Enzymes: No results for input(s): CKTOTAL, CKMB, CKMBINDEX, TROPONINI in the last 168 hours. BNP (last 3 results) Recent Labs    05/14/24 1416  PROBNP 1,468.0*   HbA1C: No results for input(s): HGBA1C in the last 72 hours. CBG: Recent Labs  Lab 05/15/24 0036 05/15/24 0547 05/15/24 1111  GLUCAP 172* 138* 312*   Lipid Profile: No results for input(s): CHOL, HDL, LDLCALC, TRIG, CHOLHDL, LDLDIRECT in the last 72 hours. Thyroid  Function Tests: No results for input(s): TSH, T4TOTAL, FREET4, T3FREE, THYROIDAB in the last 72 hours. Anemia Panel: No results for input(s): VITAMINB12, FOLATE, FERRITIN, TIBC, IRON , RETICCTPCT in the last 72 hours. Sepsis Labs: No results for input(s): PROCALCITON, LATICACIDVEN in the last 168 hours.  Recent Results (from the past 240 hours)  Urine Culture     Status: Abnormal   Collection Time: 05/12/24 11:25 AM   Specimen: Urine, Random  Result Value Ref Range Status   Specimen Description   Final    URINE, RANDOM Performed at [facility], [address], [place], [place] [address]    Special Requests   Final    NONE Reflexed from [id number] Performed at [facility], [address] [place], [place] [address]    Culture 60,000 COLONIES/mL ENTEROCOCCUS FAECALIS (A)  Final   Report Status 05/14/2024 FINAL  Final   Organism ID, Bacteria ENTEROCOCCUS FAECALIS (A)  Final      Susceptibility   Enterococcus faecalis - MIC*    AMPICILLIN <=2 SENSITIVE Sensitive     NITROFURANTOIN <=16 SENSITIVE Sensitive     VANCOMYCIN 1 SENSITIVE Sensitive     * 60,000 COLONIES/mL ENTEROCOCCUS FAECALIS         Radiology Studies: CT ABDOMEN PELVIS W CONTRAST Result Date: 05/14/2024 CLINICAL DATA:  Abdominal pain, postop (circumcision) EXAM: CT ABDOMEN AND PELVIS WITH CONTRAST TECHNIQUE: Multidetector CT imaging of the abdomen and pelvis was performed using the standard protocol following bolus administration of intravenous contrast. RADIATION DOSE REDUCTION: This exam was performed according to the departmental  dose-optimization program which includes automated exposure control, adjustment of the mA and/or kV according to patient size and/or use of iterative reconstruction technique. CONTRAST:  80mL OMNIPAQUE  IOHEXOL  350 MG/ML SOLN COMPARISON:  MRI abdomen December 23, 2022 FINDINGS: Lower chest: Please refer to same day chest CT. Atherosclerotic calcifications of coronary arteries. Lower chest: No acute abnormality. Hepatobiliary: No focal liver abnormality is seen. Cholecystectomy. No biliary mild dilation of the biliary system likely due to reservoir phenomenon. Pancreas: Atrophic changes of the pancreas. No pancreatic ductal dilatation or surrounding inflammatory changes. Spleen: Normal in size without focal abnormality. Adrenals/Urinary Tract: Left adrenal nodule measuring 1.6 cm with postcontrast Hounsfield unit of 51, stable to prior MRI likely representing a lipid rich adenoma according to prior MR  characteristics. Bilateral nonobstructive nephrolithiasis of kidneys measuring up to 6 mm on the right and 5 mm on the left without hydronephrosis. Atrophic changes of the kidneys. Bilateral simple renal cortical cysts which does not require imaging follow-up. Bladder is unremarkable. Stomach/Bowel: Stomach is within normal limits. 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 Appendix appears normal. Sigmoid diverticulosis without diverticulitis. No evidence of bowel wall thickening, distention, or inflammatory changes. Vascular/Lymphatic: Severe bilateral simple renal cortical cysts atherosclerotic calcifications. No enlarged abdominal or pelvic lymph nodes. Reproductive: Prostatomegaly. Pelvic images partially obscured by streak artifact from right hip arthroplasty. Other: No abdominal wall hernia or abnormality. No abdominopelvic ascites. Musculoskeletal: Right hip arthroplasty. Left femoral hardware partially visualized. Multilevel degenerative changes of the spine. IMPRESSION: No acute abnormality in the abdomen or pelvis. Left adrenal nodule, stable previously described as benign on prior MRI. Cholecystectomy and mild biliary dilatation (reservoir phenomenon). D3 duodenal lipoma. Atrophic changes of the pancreas. Electronically Signed   By: Megan  Zare M.D.   On: 05/14/2024 19:44   CT Head Wo Contrast Result Date: 05/14/2024 CLINICAL DATA:  Hypokalemia with dizziness and vomiting. EXAM: CT HEAD WITHOUT CONTRAST TECHNIQUE: Contiguous axial images were obtained from the base of the skull through the vertex without intravenous contrast. RADIATION DOSE REDUCTION: This exam was performed according to the departmental dose-optimization program which includes automated exposure control, adjustment of the mA and/or kV according to patient size and/or use of iterative reconstruction technique. COMPARISON:  July 26, 2016 FINDINGS: Brain: There is generalized cerebral atrophy with widening of the extra-axial spaces and  ventricular dilatation. There are areas of decreased attenuation within the white matter tracts of the supratentorial brain, consistent with microvascular disease changes. A chronic left occipital lobe infarct is seen. Vascular: Moderate severity bilateral cavernous carotid artery calcification is noted. Skull: Normal. Negative for fracture or focal lesion. Sinuses/Orbits: No acute finding. Other: None. IMPRESSION: 1. Generalized cerebral atrophy with chronic white matter small vessel ischemic changes. 2. Chronic left occipital lobe infarct. 3. No acute intracranial abnormality. Electronically Signed   By: Suzen Dials M.D.   On: 05/14/2024 19:15   CT Angio Chest PE W and/or Wo Contrast Result Date: 05/14/2024 CLINICAL DATA:  Evaluate for pulmonary embolism, high probability EXAM: CT ANGIOGRAPHY CHEST WITH CONTRAST TECHNIQUE: Multidetector CT imaging of the chest was performed using the standard protocol during bolus administration of intravenous contrast. Multiplanar CT image reconstructions and MIPs were obtained to evaluate the vascular anatomy. RADIATION DOSE REDUCTION: This exam was performed according to the departmental dose-optimization program which includes automated exposure control, adjustment of the mA and/or kV according to patient size and/or use of iterative reconstruction technique. CONTRAST:  80mL OMNIPAQUE  IOHEXOL  350 MG/ML SOLN COMPARISON:  None Available. FINDINGS: Cardiovascular: Satisfactory opacification of the  pulmonary arteries to the segmental level. No evidence of pulmonary embolism. Mildly enlarged pulmonary artery measuring up to 3 cm. No aortic aneurysm. Normal heart size. No pericardial effusion. Mediastinum/Nodes: No enlarged mediastinal, hilar, or axillary lymph nodes. Thyroid  gland, trachea, and esophagus demonstrate no significant findings. Lungs/Pleura: Left lower lobe micronodule (9/75 cluster of bronchocentric nodules in right lung base measuring up to 9 mm (9/86  multiple foci of mucus stasis/micro nodules in left lung space. Bronchial and bronchiolar wall thickening. Upper Abdomen: Left adrenal nodule measuring 1.5 cm likely lipid rich adenoma, Hounsfield units 4 on postcontrast. Bilateral nonobstructive nephrolithiasis. No hydronephrosis. Cholecystectomy. Musculoskeletal: No acute osseous abnormality prior degenerative changes of the spine. Review of the MIP images confirms the above findings. IMPRESSION: No suspicious finding to suggest pulmonary embolism. Multiple pulmonary nodules predominantly in bilateral lung base, right greater than left measuring up to 9 mm. Follow-up according to guidelines below. Left adrenal nodule measuring 1.5 cm. Recommend follow-up with dedicated CT abdomen adrenal protocol with washout if clinically warranted. Non-contrast chest CT at 3-6 months is recommended. If the nodules are stable at time of repeat CT, then future CT at 18-24 months (from today's scan) is considered optional for low-risk patients, but is recommended for high-risk patients. This recommendation follows the consensus statement: Guidelines for Management of Incidental Pulmonary Nodules Detected on CT Images: From the Fleischner Society 2017; Radiology 2017; 284:228-243. Electronically Signed   By: Megan  Zare M.D.   On: 05/14/2024 19:11        Scheduled Meds:  aspirin  EC  81 mg Oral Daily   clopidogrel   75 mg Oral Daily   enoxaparin  (LOVENOX ) injection  40 mg Subcutaneous Q24H   insulin  aspart  0-5 Units Subcutaneous QHS   insulin  aspart  0-9 Units Subcutaneous TID WC   insulin  glargine-yfgn  10 Units Subcutaneous QHS   magnesium  oxide  400 mg Oral Daily   potassium chloride   40 mEq Oral BID   rosuvastatin   20 mg Oral Daily   sodium chloride  flush  3 mL Intravenous Q12H   tamsulosin   0.4 mg Oral Daily   Continuous Infusions:  sodium chloride  75 mL/hr at 05/15/24 0504     LOS: 0 days    Time spent: 50 minutes    Renato Applebaum, MD [facility]

## 2024-05-15 NOTE — Progress Notes (Signed)
 Heart Failure Navigator Progress Note  Assessed for Heart & Vascular TOC clinic readiness.  Patient does not meet criteria due to Per MD note patient has become wheel chair dependent, has a scheduled [facility] appointment on 05/25/2024. No HF TOC. .   Navigator will sign off at this time .   Stephane Haddock, BSN, Scientist, clinical (histocompatibility and immunogenetics) Only

## 2024-05-15 NOTE — Evaluation (Signed)
 Physical Therapy Evaluation Patient Details Name: Traver Meckes MRN: [id number] DOB: 1940-10-20 Today's Date: 05/15/2024  History of Present Illness  Keith Espinoza is a 83 y.o. male admitted 8/11 with near syncope with lightheadedness.PMH:  CAD, chronic HFmrEF, insulin -dependent diabetes mellitus, right BKA,and PAD, vertigo  Clinical Impression  Pt admitted with above diagnosis. Pt was able to stand pivot to recliner with use of RW with supervision. Pt was not orthostatic. Reviewed x1 exercises for vertigo as this has been an ongoing problem for pt but is not causing his syncopal episodes at present. Further cardiac w/u in progress. Will follow acutely.  Pt currently with functional limitations due to the deficits listed below (see PT Problem List). Pt will benefit from acute skilled PT to increase their independence and safety with mobility to allow discharge.           If plan is discharge home, recommend the following: Assist for transportation   Can travel by private vehicle        Equipment Recommendations None recommended by PT  Recommendations for Other Services       Functional Status Assessment Patient has had a recent decline in their functional status and demonstrates the ability to make significant improvements in function in a reasonable and predictable amount of time.     Precautions / Restrictions Precautions Precautions: Fall Restrictions Weight Bearing Restrictions Per Provider Order: No      Mobility  Bed Mobility Overal bed mobility: Independent             General bed mobility comments: donned prosthesis independently.    Transfers Overall transfer level: Needs assistance Equipment used: Rolling walker (2 wheels) Transfers: Sit to/from Stand, Bed to chair/wheelchair/BSC Sit to Stand: Supervision Stand pivot transfers: Supervision         General transfer comment: Pt stood to RW with supervision and pivioted to recliner.  Orthostatic VS in flowsheet and WNL.    Ambulation/Gait               General Gait Details: Pt doesnt walk at home  Stairs            Wheelchair Mobility     Tilt Bed    Modified Rankin (Stroke Patients Only)       Balance Overall balance assessment: Needs assistance Sitting-balance support: No upper extremity supported, Feet supported Sitting balance-Leahy Scale: Good     Standing balance support: Bilateral upper extremity supported, During functional activity, Reliant on assistive device for balance Standing balance-Leahy Scale: Poor Standing balance comment: relies on UE suport                             Pertinent Vitals/Pain Pain Assessment Pain Assessment: No/denies pain    Home Living Family/patient expects to be discharged to:: Private residence Living Arrangements: Alone Available Help at Discharge: Family;Available PRN/intermittently (daughter lives close by and checks daily) Type of Home: House Home Access: Level entry       Home Layout: Able to live on main level with bedroom/bathroom (lives in basement apartment) Home Equipment: Agricultural consultant (2 wheels);BSC/3in1;Shower seat;Cane - single point;Wheelchair - manual;Wheelchair - power;Hand held shower head;Grab bars - tub/shower;Grab bars - toilet;Adaptive equipment;Lift chair (drop arm commode, bed is adjustable) Additional Comments: Chooses not to walk due to left LE fx but has prosthesis and wears for transfers.  Quit using walker for transfers when dizziness increased.    Prior Function Prior Level of  Function : Needs assist             Mobility Comments: Modif I with transfers stand pivot ADLs Comments: Modif I with B/D     Extremity/Trunk Assessment   Upper Extremity Assessment Upper Extremity Assessment: Defer to OT evaluation    Lower Extremity Assessment Lower Extremity Assessment: Overall WFL for tasks assessed    Cervical / Trunk Assessment Cervical /  Trunk Assessment: Normal  Communication   Communication Communication: Impaired Factors Affecting Communication: Hearing impaired    Cognition Arousal: Alert Behavior During Therapy: WFL for tasks assessed/performed   PT - Cognitive impairments: No apparent impairments                         Following commands: Intact       Cueing       General Comments General comments (skin integrity, edema, etc.): 78 bpm, 100% RA, 123/55 supine, other VS in flowsheet    Exercises Other Exercises Other Exercises: reviewed x1 exercises as pt has hx of vertigo and did test positive for right hypofunction.  however pt reports he has been doing the exercises for last month and his dizziness hasnt improved   Assessment/Plan    PT Assessment Patient needs continued PT services  PT Problem List Decreased activity tolerance;Decreased balance;Decreased mobility;Decreased knowledge of use of DME;Decreased safety awareness;Cardiopulmonary status limiting activity       PT Treatment Interventions DME instruction;Gait training;Therapeutic exercise;Functional mobility training;Therapeutic activities;Balance training;Patient/family education;Wheelchair mobility training    PT Goals (Current goals can be found in the Care Plan section)  Acute Rehab PT Goals Patient Stated Goal: to go home PT Goal Formulation: With patient Time For Goal Achievement: 05/29/24 Potential to Achieve Goals: Good    Frequency Min 2X/week     Co-evaluation               AM-PAC PT 6 Clicks Mobility  Outcome Measure Help needed turning from your back to your side while in a flat bed without using bedrails?: None Help needed moving from lying on your back to sitting on the side of a flat bed without using bedrails?: None Help needed moving to and from a bed to a chair (including a wheelchair)?: A Little Help needed standing up from a chair using your arms (e.g., wheelchair or bedside chair)?: A  Little Help needed to walk in hospital room?: A Little Help needed climbing 3-5 steps with a railing? : Total 6 Click Score: 18    End of Session Equipment Utilized During Treatment: Gait belt Activity Tolerance: Patient tolerated treatment well Patient left: in chair;with call bell/phone within reach;with chair alarm set;with family/visitor present Nurse Communication: Mobility status PT Visit Diagnosis: Muscle weakness (generalized) (M62.81);Dizziness and giddiness (R42)    Time: 8881-8847 PT Time Calculation (min) (ACUTE ONLY): 34 min   Charges:   PT Evaluation $PT Eval Moderate Complexity: 1 Mod PT Treatments $Therapeutic Activity: 8-22 mins PT General Charges $$ ACUTE PT VISIT: 1 Visit         Audreanna Torrisi M,PT Acute Rehab Services [phone]   Stephane JULIANNA Bevel 05/15/2024, 1:14 PM

## 2024-05-15 NOTE — Inpatient Diabetes Management (Signed)
 Inpatient Diabetes Program Recommendations  AACE/ADA: New Consensus Statement on Inpatient Glycemic Control (2015)  Target Ranges:  Prepandial:   less than 140 mg/dL      Peak postprandial:   less than 180 mg/dL (1-2 hours)      Critically ill patients:  140 - 180 mg/dL   Lab Results  Component Value Date   GLUCAP 312 (H) 05/15/2024   HGBA1C 7.7 (A) 12/30/2023    Review of Glycemic Control  Latest Reference Range & Units 05/15/24 00:36 05/15/24 05:47 05/15/24 11:11  Glucose-Capillary 70 - 99 mg/dL 827 (H) 861 (H) 687 (H)  (H): Data is abnormally high  Diabetes history: DM2 Outpatient Diabetes medications: Tresiba  10 units every day, Novolog  correction PRN, Metformin  1000 mg BID, Jardiance  25 mg every day, Actos  30 mg every day Current orders for Inpatient glycemic control: Semglee  10 units every day, Novolog  0-9 units TID, and 0-5 units QHS  Inpatient Diabetes Program Recommendations:    If postprandials remain elevated, might consider:  Novolog  3 units TID with meals if he consumes at least 50%  Carb modified diet  Thank you, Wyvonna Pinal, MSN, CDCES Diabetes Coordinator Inpatient Diabetes Program [phone] (team pager from 8a-5p)

## 2024-05-16 ENCOUNTER — Observation Stay (HOSPITAL_BASED_OUTPATIENT_CLINIC_OR_DEPARTMENT_OTHER)

## 2024-05-16 DIAGNOSIS — Z515 Encounter for palliative care: Secondary | ICD-10-CM | POA: Diagnosis not present

## 2024-05-16 DIAGNOSIS — R55 Syncope and collapse: Secondary | ICD-10-CM

## 2024-05-16 DIAGNOSIS — N1831 Chronic kidney disease, stage 3a: Secondary | ICD-10-CM | POA: Diagnosis not present

## 2024-05-16 DIAGNOSIS — I5022 Chronic systolic (congestive) heart failure: Secondary | ICD-10-CM | POA: Diagnosis not present

## 2024-05-16 DIAGNOSIS — Z7189 Other specified counseling: Secondary | ICD-10-CM | POA: Diagnosis not present

## 2024-05-16 DIAGNOSIS — R42 Dizziness and giddiness: Secondary | ICD-10-CM | POA: Diagnosis not present

## 2024-05-16 LAB — CBC
HCT: 29.9 % — ABNORMAL LOW (ref 39.0–52.0)
Hemoglobin: 10.1 g/dL — ABNORMAL LOW (ref 13.0–17.0)
MCH: 33.9 pg (ref 26.0–34.0)
MCHC: 33.8 g/dL (ref 30.0–36.0)
MCV: 100.3 fL — ABNORMAL HIGH (ref 80.0–100.0)
Platelets: 131 K/uL — ABNORMAL LOW (ref 150–400)
RBC: 2.98 MIL/uL — ABNORMAL LOW (ref 4.22–5.81)
RDW: 16.3 % — ABNORMAL HIGH (ref 11.5–15.5)
WBC: 7.7 K/uL (ref 4.0–10.5)
nRBC: 0 % (ref 0.0–0.2)

## 2024-05-16 LAB — BASIC METABOLIC PANEL WITH GFR
Anion gap: 10 (ref 5–15)
BUN: 17 mg/dL (ref 8–23)
CO2: 26 mmol/L (ref 22–32)
Calcium: 8.9 mg/dL (ref 8.9–10.3)
Chloride: 102 mmol/L (ref 98–111)
Creatinine, Ser: 1.18 mg/dL (ref 0.61–1.24)
GFR, Estimated: >60 mL/min (ref >60)
Glucose, Bld: 183 mg/dL — ABNORMAL HIGH (ref 70–99)
Potassium: 3.5 mmol/L (ref 3.5–5.1)
Sodium: 138 mmol/L (ref 135–145)

## 2024-05-16 LAB — MAGNESIUM: Magnesium: 1.9 mg/dL (ref 1.7–2.4)

## 2024-05-16 LAB — GLUCOSE, CAPILLARY
Glucose-Capillary: 188 mg/dL — ABNORMAL HIGH (ref 70–99)
Glucose-Capillary: 390 mg/dL — ABNORMAL HIGH (ref 70–99)

## 2024-05-16 LAB — PHOSPHORUS: Phosphorus: 2.4 mg/dL — ABNORMAL LOW (ref 2.5–4.6)

## 2024-05-16 NOTE — Discharge Summary (Signed)
 Physician Discharge Summary  Keith Espinoza FMW:[id number] DOB: Oct 29, 1940 DOA: 05/14/2024  PCP: Charlott Dorn LABOR, MD  Admit date: 05/14/2024 Discharge date: 05/16/2024  Admitted From: Home Disposition: Home  Recommendations for Outpatient Follow-up:  Follow up with PCP in 1-2 weeks Please obtain BMP/CBC in one week Please follow up on the following pending results: Echocardiogram results pending.  Home Health: N/A Equipment/Devices: Available at home  Discharge Condition: Stable CODE STATUS: Full code Diet recommendation: Regular diet, low carb  Discharge summary: 83 year old with history of coronary artery disease, chronic heart failure with mildly reduced ejection fraction, insulin -dependent type 2 diabetes, peripheral artery disease status post right BKA presented with lightheadedness and near syncopal episode, orthostatic hypotension. Patient with history of orthostatic hypotension, initially improved with discontinuing metoprolol  however now recurred. Patient to the point cannot get up and walk. He has become wheelchair dependent, however still gets dizzy on sitting up. At the emergency room patient is afebrile, on room air. Glucose 353. EKG with sinus rhythm and first-degree AV block. Patient was given IV fluids and meclizine . Transferred to hospital for admission due to significant symptoms.  Symptomatically treated.  All antihypertensives were discontinued.  Symptoms improved.  Today he is without orthostasis and back to his baseline.  # Dizziness and lightheadedness, near syncope: Patient with chronic hypotension.  Currently negative for orthostatic, however symptomatology consistent with orthostatic hypotension.  Treated with gentle IV fluids.  Clinically improved.  Able to stand up without dizziness today.  Metoprolol  and Ranexa  were discontinued as outpatient.  Hold Entresto /hold Imdur  until further follow-up.    Chronic heart failure with mildly reduced ejection  fraction: Known EF 40 to 45%.  Currently euvolemic.  Holding Entresto .  Repeat echocardiogram done today, results pending.  Patient can go back on Lasix  20 mg daily along with potassium supplementation.   Coronary artery disease: Occasional angina relieved with nitroglycerin .  Not a candidate for PCI or bypass per recent cardiology notes.  He is on aspirin , Plavix , Crestor  that is continued.  If allowed with blood pressure, may benefit with nitrates and beta-blockers however currently he is not able to tolerate any of the antihypertensives.  Without any chest pain.   CKD stage IIIa: At about baseline.   Type 2 diabetes: On insulin  at home.  Stable.  Continue home medications including insulin , Januvia and Actos .   Lung nodules: Multiple lung nodules with maximum diameter 9 mm bilateral lung base.  Outpatient follow-up in repeat CT scan in 1 year.  E faecalis UTI: Urine culture collected on admission with Enterococcus faecalis 60,000 colonies.  Patient without urinary symptoms.  Will not treat.  Stabilized to go home with his daughter.    Discharge Diagnoses:  Principal Problem:   Lightheadedness Active Problems:   Coronary artery disease   Type 2 diabetes mellitus with hyperglycemia, with long-term current use of insulin  (HCC)   Chronic heart failure with mildly reduced ejection fraction (HFmrEF, 41-49%) (HCC)   Lung nodules   CKD stage 3a, GFR 45-59 ml/min (HCC)   Palliative care by specialist   Goals of care, counseling/discussion    Discharge Instructions  Discharge Instructions     Diet general   Complete by: As directed    Increase activity slowly   Complete by: As directed       Allergies as of 05/16/2024       Reactions   Simvastatin Other (See Comments)   SEVERE MYALGIAS   Zetia [ezetimibe] Other (See Comments)   MYALGIAS   Dilaudid  [  hydromorphone  Hcl] Other (See Comments)   hallucination        Medication List     PAUSE taking these medications     isosorbide  mononitrate 60 MG 24 hr tablet Wait to take this until: June 06, 2024 Commonly known as: IMDUR  Take 1 tablet (60 mg total) by mouth daily.   sacubitril -valsartan  24-26 MG Wait to take this until: June 06, 2024 Commonly known as: ENTRESTO  Take 1 tablet by mouth 2 (two) times daily.       STOP taking these medications    metoprolol  succinate 25 MG 24 hr tablet Commonly known as: TOPROL -XL   ranolazine  500 MG 12 hr tablet Commonly known as: Ranexa        TAKE these medications    Acetaminophen  Extra Strength 500 MG Tabs Take 2 tablets (1,000 mg total) by mouth every 6 (six) hours. What changed:  when to take this reasons to take this   allopurinol  300 MG tablet Commonly known as: ZYLOPRIM  Take 300 mg by mouth daily.   aspirin  EC 81 MG tablet Take 81 mg by mouth daily. Swallow whole.   B-12 1000 MCG Tabs Take 1,000 mcg by mouth daily.   clopidogrel  75 MG tablet Commonly known as: PLAVIX  Take 1 tablet (75 mg total) by mouth daily.   Dexcom G7 Sensor Misc 1 Device by Does not apply route as directed.   Fish Oil 500 MG Caps Take 500 mg by mouth daily.   furosemide  20 MG tablet Commonly known as: LASIX  Take 1 tablet (20 mg total) by mouth daily.   gabapentin  400 MG capsule Commonly known as: NEURONTIN  Take 1 capsule (400 mg total) by mouth daily. What changed: Another medication with the same name was removed. Continue taking this medication, and follow the directions you see here.   Insulin  Pen Needle 32G X 4 MM Misc 1 Device by Does not apply route in the morning, at noon, in the evening, and at bedtime.   Jardiance  25 MG Tabs tablet Generic drug: empagliflozin  TAKE 1 TABLET BY MOUTH DAILY   Klor-Con  M20 20 MEQ tablet Generic drug: potassium chloride  SA Take 20 mEq by mouth daily.   Magnesium  400 MG Caps Take 400 mg by mouth daily.   metFORMIN  1000 MG tablet Commonly known as: GLUCOPHAGE  Take 1 tablet (1,000 mg total) by  mouth 2 (two) times daily.   nitroGLYCERIN  0.4 MG SL tablet Commonly known as: NITROSTAT  Place 1 tablet (0.4 mg total) under the tongue every 5 (five) minutes x 3 doses as needed for chest pain.   NovoLOG  FlexPen 100 UNIT/ML FlexPen Generic drug: insulin  aspart Max daily 30 units What changed:  how to take this when to take this reasons to take this   pioglitazone  30 MG tablet Commonly known as: ACTOS  Take 1 tablet (30 mg total) by mouth daily.   QC TUMERIC COMPLEX PO Take 500 mg by mouth daily.   rosuvastatin  20 MG tablet Commonly known as: CRESTOR  Take 1 tablet (20 mg total) by mouth daily.   senna-docusate 8.6-50 MG tablet Commonly known as: Senokot-S Take 1 tablet by mouth at bedtime. What changed: when to take this   terazosin  5 MG capsule Commonly known as: HYTRIN  Take 5 mg by mouth 2 (two) times daily.   Tresiba  FlexTouch 100 UNIT/ML FlexTouch Pen Generic drug: insulin  degludec Inject 10 Units into the skin daily.   VITAMIN C PO Take 1 tablet by mouth daily.   Vitamin D3 50 MCG (2000 UT) Tabs Take  2,000 Units by mouth daily.        Allergies  Allergen Reactions   Simvastatin Other (See Comments)    SEVERE MYALGIAS    Zetia [Ezetimibe] Other (See Comments)    MYALGIAS   Dilaudid  [Hydromorphone  Hcl] Other (See Comments)    hallucination    Consultations: None   Procedures/Studies: CT ABDOMEN PELVIS W CONTRAST Result Date: 05/14/2024 CLINICAL DATA:  Abdominal pain, postop (circumcision) EXAM: CT ABDOMEN AND PELVIS WITH CONTRAST TECHNIQUE: Multidetector CT imaging of the abdomen and pelvis was performed using the standard protocol following bolus administration of intravenous contrast. RADIATION DOSE REDUCTION: This exam was performed according to the departmental dose-optimization program which includes automated exposure control, adjustment of the mA and/or kV according to patient size and/or use of iterative reconstruction technique. CONTRAST:   80mL OMNIPAQUE  IOHEXOL  350 MG/ML SOLN COMPARISON:  MRI abdomen December 23, 2022 FINDINGS: Lower chest: Please refer to same day chest CT. Atherosclerotic calcifications of coronary arteries. Lower chest: No acute abnormality. Hepatobiliary: No focal liver abnormality is seen. Cholecystectomy. No biliary mild dilation of the biliary system likely due to reservoir phenomenon. Pancreas: Atrophic changes of the pancreas. No pancreatic ductal dilatation or surrounding inflammatory changes. Spleen: Normal in size without focal abnormality. Adrenals/Urinary Tract: Left adrenal nodule measuring 1.6 cm with postcontrast Hounsfield unit of 51, stable to prior MRI likely representing a lipid rich adenoma according to prior MR characteristics. Bilateral nonobstructive nephrolithiasis of kidneys measuring up to 6 mm on the right and 5 mm on the left without hydronephrosis. Atrophic changes of the kidneys. Bilateral simple renal cortical cysts which does not require imaging follow-up. Bladder is unremarkable. Stomach/Bowel: Stomach is within normal limits. 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 Appendix appears normal. Sigmoid diverticulosis without diverticulitis. No evidence of bowel wall thickening, distention, or inflammatory changes. Vascular/Lymphatic: Severe bilateral simple renal cortical cysts atherosclerotic calcifications. No enlarged abdominal or pelvic lymph nodes. Reproductive: Prostatomegaly. Pelvic images partially obscured by streak artifact from right hip arthroplasty. Other: No abdominal wall hernia or abnormality. No abdominopelvic ascites. Musculoskeletal: Right hip arthroplasty. Left femoral hardware partially visualized. Multilevel degenerative changes of the spine. IMPRESSION: No acute abnormality in the abdomen or pelvis. Left adrenal nodule, stable previously described as benign on prior MRI. Cholecystectomy and mild biliary dilatation (reservoir phenomenon). D3 duodenal lipoma. Atrophic changes of the  pancreas. Electronically Signed   By: Megan  Zare M.D.   On: 05/14/2024 19:44   CT Head Wo Contrast Result Date: 05/14/2024 CLINICAL DATA:  Hypokalemia with dizziness and vomiting. EXAM: CT HEAD WITHOUT CONTRAST TECHNIQUE: Contiguous axial images were obtained from the base of the skull through the vertex without intravenous contrast. RADIATION DOSE REDUCTION: This exam was performed according to the departmental dose-optimization program which includes automated exposure control, adjustment of the mA and/or kV according to patient size and/or use of iterative reconstruction technique. COMPARISON:  July 26, 2016 FINDINGS: Brain: There is generalized cerebral atrophy with widening of the extra-axial spaces and ventricular dilatation. There are areas of decreased attenuation within the white matter tracts of the supratentorial brain, consistent with microvascular disease changes. A chronic left occipital lobe infarct is seen. Vascular: Moderate severity bilateral cavernous carotid artery calcification is noted. Skull: Normal. Negative for fracture or focal lesion. Sinuses/Orbits: No acute finding. Other: None. IMPRESSION: 1. Generalized cerebral atrophy with chronic white matter small vessel ischemic changes. 2. Chronic left occipital lobe infarct. 3. No acute intracranial abnormality. Electronically Signed   By: Suzen Dials M.D.   On: 05/14/2024  19:15   CT Angio Chest PE W and/or Wo Contrast Result Date: 05/14/2024 CLINICAL DATA:  Evaluate for pulmonary embolism, high probability EXAM: CT ANGIOGRAPHY CHEST WITH CONTRAST TECHNIQUE: Multidetector CT imaging of the chest was performed using the standard protocol during bolus administration of intravenous contrast. Multiplanar CT image reconstructions and MIPs were obtained to evaluate the vascular anatomy. RADIATION DOSE REDUCTION: This exam was performed according to the departmental dose-optimization program which includes automated exposure control,  adjustment of the mA and/or kV according to patient size and/or use of iterative reconstruction technique. CONTRAST:  80mL OMNIPAQUE  IOHEXOL  350 MG/ML SOLN COMPARISON:  None Available. FINDINGS: Cardiovascular: Satisfactory opacification of the pulmonary arteries to the segmental level. No evidence of pulmonary embolism. Mildly enlarged pulmonary artery measuring up to 3 cm. No aortic aneurysm. Normal heart size. No pericardial effusion. Mediastinum/Nodes: No enlarged mediastinal, hilar, or axillary lymph nodes. Thyroid  gland, trachea, and esophagus demonstrate no significant findings. Lungs/Pleura: Left lower lobe micronodule (9/75 cluster of bronchocentric nodules in right lung base measuring up to 9 mm (9/86 multiple foci of mucus stasis/micro nodules in left lung space. Bronchial and bronchiolar wall thickening. Upper Abdomen: Left adrenal nodule measuring 1.5 cm likely lipid rich adenoma, Hounsfield units 4 on postcontrast. Bilateral nonobstructive nephrolithiasis. No hydronephrosis. Cholecystectomy. Musculoskeletal: No acute osseous abnormality prior degenerative changes of the spine. Review of the MIP images confirms the above findings. IMPRESSION: No suspicious finding to suggest pulmonary embolism. Multiple pulmonary nodules predominantly in bilateral lung base, right greater than left measuring up to 9 mm. Follow-up according to guidelines below. Left adrenal nodule measuring 1.5 cm. Recommend follow-up with dedicated CT abdomen adrenal protocol with washout if clinically warranted. Non-contrast chest CT at 3-6 months is recommended. If the nodules are stable at time of repeat CT, then future CT at 18-24 months (from today's scan) is considered optional for low-risk patients, but is recommended for high-risk patients. This recommendation follows the consensus statement: Guidelines for Management of Incidental Pulmonary Nodules Detected on CT Images: From the Fleischner Society 2017; Radiology 2017;  284:228-243. Electronically Signed   By: Megan  Zare M.D.   On: 05/14/2024 19:11   (Echo, Carotid, EGD, Colonoscopy, ERCP)    Subjective: Patient seen and examined in the morning rounds.  Denies any complaints.  Eager to go home.  Daughter arrived at the bedside.  Discussed about medication management.  Discussed about holding antihypertensives.   Discharge Exam: Vitals:   05/16/24 0807 05/16/24 1216  BP: (!) 119/59 (!) 105/51  Pulse: 63 65  Resp: 20 18  Temp: 97.7 F (36.5 C)   SpO2: 100% 100%   Vitals:   05/15/24 1957 05/15/24 2309 05/16/24 0807 05/16/24 1216  BP: (!) 106/56 (!) 123/55 (!) 119/59 (!) 105/51  Pulse: 62 66 63 65  Resp: 20 20 20 18   Temp: 98.6 F (37 C) 98 F (36.7 C) 97.7 F (36.5 C)   TempSrc: Oral Oral Oral Oral  SpO2: 100% 100% 100% 100%    General: Pt is alert, awake, not in acute distress.  Pleasant interactive. Cardiovascular: RRR, S1/S2 +, no rubs, no gallops Respiratory: CTA bilaterally, no wheezing, no rhonchi Abdominal: Soft, NT, ND, bowel sounds + Extremities: no edema, no cyanosis, right below-knee amputation stump clean and dry.    The results of significant diagnostics from this hospitalization (including imaging, microbiology, ancillary and laboratory) are listed below for reference.     Microbiology: Recent Results (from the past 240 hours)  Urine Culture     Status:  Abnormal   Collection Time: 05/12/24 11:25 AM   Specimen: Urine, Random  Result Value Ref Range Status   Specimen Description   Final    URINE, RANDOM Performed at [facility], [address] [place], [place] [address]    Special Requests   Final    NONE Reflexed from [id number] Performed at [facility], [address], [place], [place] [address]    Culture 60,000 COLONIES/mL ENTEROCOCCUS FAECALIS (A)  Final   Report Status 05/14/2024 FINAL  Final   Organism ID, Bacteria ENTEROCOCCUS FAECALIS (A)  Final      Susceptibility    Enterococcus faecalis - MIC*    AMPICILLIN <=2 SENSITIVE Sensitive     NITROFURANTOIN <=16 SENSITIVE Sensitive     VANCOMYCIN 1 SENSITIVE Sensitive     * 60,000 COLONIES/mL ENTEROCOCCUS FAECALIS     Labs: BNP (last 3 results) Recent Labs    07/03/23 1305  BNP 1,247.5*   Basic Metabolic Panel: Recent Labs  Lab 05/12/24 1125 05/12/24 1221 05/14/24 1416 05/15/24 0354 05/16/24 0254  NA 139  --  137 138 138  K 2.7*  --  3.7 3.0* 3.5  CL 97*  --  97* 100 102  CO2 25  --  22 30 26   GLUCOSE 283*  --  353* 153* 183*  BUN 20  --  21 18 17   CREATININE 1.49*  --  1.46* 1.24 1.18  CALCIUM  9.8  --  10.0 8.9 8.9  MG  --  1.6* 1.7 1.5* 1.9  PHOS  --   --   --   --  2.4*   Liver Function Tests: Recent Labs  Lab 05/12/24 1125 05/14/24 1416  AST 27 36  ALT 13 17  ALKPHOS 73 105  BILITOT 0.6 0.5  PROT 6.5 6.6  ALBUMIN  3.7 3.5   Recent Labs  Lab 05/14/24 1416  LIPASE 31   No results for input(s): AMMONIA in the last 168 hours. CBC: Recent Labs  Lab 05/12/24 1125 05/14/24 1416 05/15/24 0354 05/16/24 0254  WBC 6.9 11.3* 7.6 7.7  NEUTROABS 5.3  --   --   --   HGB 10.4* 10.3* 9.4* 10.1*  HCT 30.2* 30.1* 27.3* 29.9*  MCV 96.8 100.0 98.9 100.3*  PLT 147* 132* 113* 131*   Cardiac Enzymes: No results for input(s): CKTOTAL, CKMB, CKMBINDEX, TROPONINI in the last 168 hours. BNP: Invalid input(s): POCBNP CBG: Recent Labs  Lab 05/15/24 1111 05/15/24 1611 05/15/24 2122 05/16/24 0623 05/16/24 1215  GLUCAP 312* 422* 134* 188* 390*   D-Dimer No results for input(s): DDIMER in the last 72 hours. Hgb A1c No results for input(s): HGBA1C in the last 72 hours. Lipid Profile No results for input(s): CHOL, HDL, LDLCALC, TRIG, CHOLHDL, LDLDIRECT in the last 72 hours. Thyroid  function studies No results for input(s): TSH, T4TOTAL, T3FREE, THYROIDAB in the last 72 hours.  Invalid input(s): FREET3 Anemia work up No results for  input(s): VITAMINB12, FOLATE, FERRITIN, TIBC, IRON , RETICCTPCT in the last 72 hours. Urinalysis    Component Value Date/Time   COLORURINE YELLOW 05/14/2024 1617   APPEARANCEUR CLEAR 05/14/2024 1617   LABSPEC 1.010 05/14/2024 1617   PHURINE 5.0 05/14/2024 1617   GLUCOSEU >=500 (A) 05/14/2024 1617   HGBUR NEGATIVE 05/14/2024 1617   BILIRUBINUR NEGATIVE 05/14/2024 1617   KETONESUR NEGATIVE 05/14/2024 1617   PROTEINUR NEGATIVE 05/14/2024 1617   NITRITE NEGATIVE 05/14/2024 1617   LEUKOCYTESUR NEGATIVE 05/14/2024 1617   Sepsis Labs Recent Labs  Lab 05/12/24  1125 05/14/24 1416 05/15/24 0354 05/16/24 0254  WBC 6.9 11.3* 7.6 7.7   Microbiology Recent Results (from the past 240 hours)  Urine Culture     Status: Abnormal   Collection Time: 05/12/24 11:25 AM   Specimen: Urine, Random  Result Value Ref Range Status   Specimen Description   Final    URINE, RANDOM Performed at [facility], [address], [place], [place] [address]    Special Requests   Final    NONE Reflexed from [id number] Performed at [facility], [address], [place], [place] [address]    Culture 60,000 COLONIES/mL ENTEROCOCCUS FAECALIS (A)  Final   Report Status 05/14/2024 FINAL  Final   Organism ID, Bacteria ENTEROCOCCUS FAECALIS (A)  Final      Susceptibility   Enterococcus faecalis - MIC*    AMPICILLIN <=2 SENSITIVE Sensitive     NITROFURANTOIN <=16 SENSITIVE Sensitive     VANCOMYCIN 1 SENSITIVE Sensitive     * 60,000 COLONIES/mL ENTEROCOCCUS FAECALIS     Time coordinating discharge: 35 minutes  SIGNED:   Renato Applebaum, MD  [facility] 05/16/2024, 2:59 PM

## 2024-05-16 NOTE — Progress Notes (Signed)
 Mobility Specialist Progress Note:   05/16/24 0831  Mobility  Activity Pivoted/transferred from bed to chair  Level of Assistance Standby assist, set-up cues, supervision of patient - no hands on  Assistive Device Front wheel walker  Distance Ambulated (ft) 3 ft  Activity Response Tolerated well  Mobility Referral Yes  Mobility visit 1 Mobility  Mobility Specialist Start Time (ACUTE ONLY) 0831  Mobility Specialist Stop Time (ACUTE ONLY) 0839  Mobility Specialist Time Calculation (min) (ACUTE ONLY) 8 min   Pt received in bed, agreeable to transfer B>C. Denied dizziness throughout session. VSS throughout. Pt able to don prosthetic. Sitting up in chair with all needs met, call bell in reach.    Pre Mobility:119/59  Post Mobility:116/97   Rekia Kujala Mobility Specialist Please contact via SecureChat or  Rehab office at [phone]

## 2024-05-16 NOTE — Evaluation (Signed)
 Occupational Therapy Evaluation & Discharge Patient Details Name: Suhayb Anzalone MRN: [id number] DOB: 02-04-1941 Today's Date: 05/16/2024   History of Present Illness   Keith Espinoza is a 83 y.o. male admitted 8/11 with near syncope with lightheadedness.PMH:  CAD, chronic HFmrEF, insulin -dependent diabetes mellitus, right BKA,and PAD, vertigo     Clinical Impressions Pt admitted based on above, and was seen based on problem list below. PTA pt was living alone and was mod I with ADLs. Today pt is at his functional baseline of mod I for ADLs. Pt received in bed with daughter present. Pt resistant to OT eval repeatedly stating  I can do all of that at home, you can watch me do it from home. With encouragement from OT and daughter, pt eventually agreeable to complete grooming tasks seated EOB. Daughter provided home set up and PLOF, reporting pt transfers to w/c and completes all ADLs seated, and can stand pivot transfer to shower seat and toilet with mod I. OT attempted to educate pt on use of compensatory strategies for ADLs to reduce dizziness, but pt adamantly refusing, reporting his home set up works best. Pt is at baseline for ADLs, both pt and daughter reporting no concerns for ADLs, no DME needs. No follow up OT needs, no further acute needs. OT is signing off on this pt.        If plan is discharge home, recommend the following:   Assistance with cooking/housework;Assistance with feeding;Assist for transportation     Functional Status Assessment   Patient has not had a recent decline in their functional status     Equipment Recommendations   None recommended by OT      Precautions/Restrictions   Precautions Precautions: Fall Recall of Precautions/Restrictions: Intact Restrictions Weight Bearing Restrictions Per Provider Order: No     Mobility Bed Mobility Overal bed mobility: Independent     General bed mobility comments: No assist supine to  EOB    Transfers     General transfer comment: Pt declining transfers at this time      Balance Overall balance assessment: Needs assistance Sitting-balance support: No upper extremity supported, Feet supported Sitting balance-Leahy Scale: Good     ADL either performed or assessed with clinical judgement   ADL Overall ADL's : At baseline;Modified independent     General ADL Comments: Pt at baseline completes ADLs seated, donned prosthetic with mobility specialist, agreeable to grooming seated EOB, declining standing & transfers     Vision Baseline Vision/History: 1 Wears glasses Patient Visual Report: No change from baseline Vision Assessment?: No apparent visual deficits            Pertinent Vitals/Pain Pain Assessment Pain Assessment: No/denies pain     Extremity/Trunk Assessment Upper Extremity Assessment Upper Extremity Assessment: Overall WFL for tasks assessed   Lower Extremity Assessment Lower Extremity Assessment: Defer to PT evaluation   Cervical / Trunk Assessment Cervical / Trunk Assessment: Normal   Communication Communication Communication: Impaired Factors Affecting Communication: Hearing impaired   Cognition Arousal: Alert Behavior During Therapy: Agitated, Restless Cognition: No apparent impairments     OT - Cognition Comments: Daughter present reporting pt at baseline restless, frequently declines assistance     Following commands: Intact       Cueing  General Comments   Cueing Techniques: Verbal cues  Daughter present for session supportive, and encouraging pt to participate           Home Living Family/patient expects to be discharged to:: Private  residence Living Arrangements: Alone Available Help at Discharge: Family;Available PRN/intermittently Type of Home: House Home Access: Level entry     Home Layout: Able to live on main level with bedroom/bathroom     Bathroom Shower/Tub: Producer, television/film/video:  Handicapped height Bathroom Accessibility: Yes How Accessible: Accessible via wheelchair Home Equipment: Rolling Walker (2 wheels);BSC/3in1;Shower seat;Cane - single point;Wheelchair - manual;Wheelchair - power;Hand held shower head;Grab bars - tub/shower;Grab bars - toilet;Adaptive equipment;Lift chair;Other (comment) (Drop arm commode, adjustable bed) Adaptive Equipment: Reacher;Sock aid;Long-handled shoe horn;Long-handled sponge        Prior Functioning/Environment Prior Level of Function : Needs assist             Mobility Comments: Mod I with SPT to w/c ADLs Comments: Mod I with ADLs from w/c or bed level, completes grooming seated in w/c, stand pivots to shower chair no assists    OT Problem List: Decreased safety awareness;Impaired balance (sitting and/or standing)        OT Goals(Current goals can be found in the care plan section)   Acute Rehab OT Goals Patient Stated Goal: To be left alone OT Goal Formulation: All assessment and education complete, DC therapy Time For Goal Achievement: 05/30/24 Potential to Achieve Goals: Good   AM-PAC OT 6 Clicks Daily Activity     Outcome Measure Help from another person eating meals?: None Help from another person taking care of personal grooming?: None Help from another person toileting, which includes using toliet, bedpan, or urinal?: None Help from another person bathing (including washing, rinsing, drying)?: None Help from another person to put on and taking off regular upper body clothing?: None Help from another person to put on and taking off regular lower body clothing?: None 6 Click Score: 24   End of Session Nurse Communication: Mobility status  Activity Tolerance: Patient tolerated treatment well Patient left: in bed;with call bell/phone within reach;with family/visitor present  OT Visit Diagnosis: Unsteadiness on feet (R26.81);Other abnormalities of gait and mobility (R26.89);Muscle weakness (generalized)  (M62.81);Dizziness and giddiness (R42)                Time: 9056-9045 OT Time Calculation (min): 11 min Charges:  OT General Charges $OT Visit: 1 Visit OT Evaluation $OT Eval Low Complexity: 1 Low  Adrianne BROCKS, OT  Acute Rehabilitation Services Office [phone] Secure chat preferred   Adrianne GORMAN Savers 05/16/2024, 10:56 AM

## 2024-05-16 NOTE — Inpatient Diabetes Management (Signed)
 Inpatient Diabetes Program Recommendations  AACE/ADA: New Consensus Statement on Inpatient Glycemic Control (2015)  Target Ranges:  Prepandial:   less than 140 mg/dL      Peak postprandial:   less than 180 mg/dL (1-2 hours)      Critically ill patients:  140 - 180 mg/dL   Lab Results  Component Value Date   GLUCAP 188 (H) 05/16/2024   HGBA1C 7.7 (A) 12/30/2023    Review of Glycemic Control  Diabetes history: DM2 Outpatient Diabetes medications: Tresiba  10 units every day, Novolog  correction PRN, Metformin  1000 mg BID, Jardiance  25 mg every day, Actos  30 mg every day Current orders for Inpatient glycemic control: Semglee  10 units every day, Novolog  0-9 units TID, and 0-5 units QHS   Inpatient Diabetes Program Recommendations:     If postprandials remain elevated, might consider:   Novolog  3 units TID with meals if he consumes at least 50%   Carb modified diet  Thank you, Wyvonna Pinal, MSN, CDCES Diabetes Coordinator Inpatient Diabetes Program [phone] (team pager from 8a-5p)

## 2024-05-16 NOTE — Progress Notes (Signed)
 Reviewed AVS, patient expressed understanding of medications, MD follow up reviewed.   See LDA for information on wounds at discharge. Patient states all belongings brought to the hospital at time of admission are accounted for and packed to take home.

## 2024-05-16 NOTE — Progress Notes (Signed)
 Palliative Medicine Progress Note   Patient Name: Keith Espinoza       Date: 05/16/2024 DOB: 11/12/1940  Age: 83 y.o. MRN#: [id number] Attending Physician: Raenelle Coria, MD Primary Care Physician: Charlott Dorn LABOR, MD Admit Date: 05/14/2024   HPI/Patient Profile: 83 y.o. male  with past medical history of coronary artery disease, chronic heart failure with mildly reduced EF, insulin -dependent type 2 diabetes, and peripheral artery disease status post right BKA who presented to the ED on 05/14/2024 and is admitted with lightheadedness, near syncopal episode, and orthostatic hypotension.    Palliative Medicine has been consulted for goals of care discussions. Patient and family are faced with anticipatory care needs and complex medical decision making.   Subjective: Chart reviewed. Patient assessed. He reports feeling better. Pending discharge today.  I met with daughter at bedside as a follow-up for palliative needs. Ongoing conversation was had regarding patient's current medical situation. Reviewed that patient has chronic HF and severe CAD with multiple blockages. Daughter understands there are limited options to manage these issues. She plans to follow-up with cardiologist as soon as possible to discuss medications. I reviewed that per most recent cardiology note from 02/24/24, dizziness is likely due to metoprolol ,  but previously when metoprolol  was stopped it seemed to be associated with chest pain.  Discussed that balancing symptom control with medication management is challenging due to low blood pressure and dizziness.  Daughter a document that she thought to be HCPOA, when I reviewed it to find it is actually durable POA. Fortunately, we were able to coordinate completing a new  HCPOA document today.    Objective:  Physical Exam Vitals reviewed.  Constitutional:      General: He is not in acute distress.    Comments: Chronically ill-appearing  Pulmonary:     Effort: Pulmonary effort is normal.  Musculoskeletal:     Right Lower Extremity: Right leg is amputated below knee.  Neurological:     Mental Status: He is alert and oriented to person, place, and time.               Palliative Medicine Assessment & Plan   Assessment: Principal Problem:   Lightheadedness Active Problems:   Coronary artery disease   Type 2 diabetes mellitus with hyperglycemia, with long-term current use of insulin  (HCC)  Chronic heart failure with mildly reduced ejection fraction (HFmrEF, 41-49%) (HCC)   Lung nodules   CKD stage 3a, GFR 45-59 ml/min (HCC)   Palliative care by specialist   Goals of care, counseling/discussion    Recommendations/Plan: Continue full scope Encouraged patient and daughter to have further discussion around code status HCPOA document completed  Code Status: Full code   Prognosis:  Unable to determine  Discharge Planning: Home with Home Health   Thank you for allowing the Palliative Medicine Team to assist in the care of this patient.   Time: 37 minutes   Recardo KATHEE Loll, NP   Please contact Palliative Medicine Team phone at [phone] for questions and concerns.  For individual providers, please see AMION.

## 2024-05-17 LAB — URINALYSIS, ROUTINE W REFLEX MICROSCOPIC
Bilirubin Urine: NEGATIVE
Glucose, UA: >=500 mg/dL — AB
Ketones, ur: NEGATIVE mg/dL
Leukocytes,Ua: NEGATIVE
Nitrite: NEGATIVE
Protein, ur: NEGATIVE mg/dL
Specific Gravity, Urine: 1.01 (ref 1.005–1.030)
pH: 5 (ref 5.0–8.0)

## 2024-05-17 LAB — ECHOCARDIOGRAM COMPLETE
AR max vel: 1.1 cm2
AV Area VTI: 1.13 cm2
AV Area mean vel: 1.14 cm2
AV Mean grad: 11.3 mmHg
AV Peak grad: 22.2 mmHg
Ao pk vel: 2.36 m/s
Area-P 1/2: 2.73 cm2
S' Lateral: 3.3 cm

## 2024-05-18 DIAGNOSIS — R8279 Other abnormal findings on microbiological examination of urine: Secondary | ICD-10-CM | POA: Diagnosis not present

## 2024-05-18 DIAGNOSIS — R42 Dizziness and giddiness: Secondary | ICD-10-CM | POA: Diagnosis not present

## 2024-05-18 DIAGNOSIS — E876 Hypokalemia: Secondary | ICD-10-CM | POA: Diagnosis not present

## 2024-05-24 DIAGNOSIS — E113393 Type 2 diabetes mellitus with moderate nonproliferative diabetic retinopathy without macular edema, bilateral: Secondary | ICD-10-CM | POA: Diagnosis not present

## 2024-05-24 DIAGNOSIS — H353211 Exudative age-related macular degeneration, right eye, with active choroidal neovascularization: Secondary | ICD-10-CM | POA: Diagnosis not present

## 2024-05-24 DIAGNOSIS — H353221 Exudative age-related macular degeneration, left eye, with active choroidal neovascularization: Secondary | ICD-10-CM | POA: Diagnosis not present

## 2024-05-24 DIAGNOSIS — H353133 Nonexudative age-related macular degeneration, bilateral, advanced atrophic without subfoveal involvement: Secondary | ICD-10-CM | POA: Diagnosis not present

## 2024-05-25 ENCOUNTER — Encounter: Payer: Self-pay | Admitting: Cardiology

## 2024-05-25 ENCOUNTER — Ambulatory Visit: Attending: Cardiology | Admitting: Cardiology

## 2024-05-25 VITALS — BP 126/62 | HR 80 | Ht 70.0 in | Wt 146.0 lb

## 2024-05-25 DIAGNOSIS — I5032 Chronic diastolic (congestive) heart failure: Secondary | ICD-10-CM | POA: Insufficient documentation

## 2024-05-25 DIAGNOSIS — I739 Peripheral vascular disease, unspecified: Secondary | ICD-10-CM | POA: Insufficient documentation

## 2024-05-25 DIAGNOSIS — I952 Hypotension due to drugs: Secondary | ICD-10-CM | POA: Insufficient documentation

## 2024-05-25 DIAGNOSIS — S88119D Complete traumatic amputation at level between knee and ankle, unspecified lower leg, subsequent encounter: Secondary | ICD-10-CM | POA: Diagnosis not present

## 2024-05-25 DIAGNOSIS — N1831 Chronic kidney disease, stage 3a: Secondary | ICD-10-CM | POA: Diagnosis not present

## 2024-05-25 DIAGNOSIS — E1142 Type 2 diabetes mellitus with diabetic polyneuropathy: Secondary | ICD-10-CM | POA: Diagnosis not present

## 2024-05-25 DIAGNOSIS — E1151 Type 2 diabetes mellitus with diabetic peripheral angiopathy without gangrene: Secondary | ICD-10-CM | POA: Diagnosis not present

## 2024-05-25 DIAGNOSIS — D500 Iron deficiency anemia secondary to blood loss (chronic): Secondary | ICD-10-CM | POA: Diagnosis not present

## 2024-05-25 DIAGNOSIS — D649 Anemia, unspecified: Secondary | ICD-10-CM | POA: Diagnosis not present

## 2024-05-25 DIAGNOSIS — R42 Dizziness and giddiness: Secondary | ICD-10-CM | POA: Diagnosis not present

## 2024-05-25 DIAGNOSIS — I2510 Atherosclerotic heart disease of native coronary artery without angina pectoris: Secondary | ICD-10-CM | POA: Diagnosis not present

## 2024-05-25 DIAGNOSIS — I5042 Chronic combined systolic (congestive) and diastolic (congestive) heart failure: Secondary | ICD-10-CM | POA: Diagnosis not present

## 2024-05-25 DIAGNOSIS — I350 Nonrheumatic aortic (valve) stenosis: Secondary | ICD-10-CM | POA: Diagnosis not present

## 2024-05-25 DIAGNOSIS — E1165 Type 2 diabetes mellitus with hyperglycemia: Secondary | ICD-10-CM | POA: Diagnosis not present

## 2024-05-25 NOTE — Patient Instructions (Signed)
 Medication Instructions:  Stop Entresto . Stop Isosorbide  mononitrate *If you need a refill on your cardiac medications before your next appointment, please call your pharmacy*  Lab Work: none If you have labs (blood work) drawn today and your tests are completely normal, you will receive your results only by: MyChart Message (if you have MyChart) OR A paper copy in the mail If you have any lab test that is abnormal or we need to change your treatment, we will call you to review the results.  Testing/Procedures: none  Follow-Up: At [facility], you and your health needs are our priority.  As part of our continuing mission to provide you with exceptional heart care, our providers are all part of one team.  This team includes your primary Cardiologist (physician) and Advanced Practice Providers or APPs (Physician Assistants and Nurse Practitioners) who all work together to provide you with the care you need, when you need it.  Your next appointment:   6 month(s)  Provider:   Gordy Bergamo, MD    We recommend signing up for the patient portal called MyChart.  Sign up information is provided on this After Visit Summary.  MyChart is used to connect with patients for Virtual Visits (Telemedicine).  Patients are able to view lab/test results, encounter notes, upcoming appointments, etc.  Non-urgent messages can be sent to your provider as well.   To learn more about what you can do with MyChart, go to [email or web address].   Other Instructions

## 2024-05-25 NOTE — Progress Notes (Signed)
 Cardiology Office Note:  .   Date:  05/26/2024  ID:  Keith Espinoza, DOB Oct 29, 1940, MRN [id number] PCP: Charlott Dorn LABOR, MD  [facility] Providers Cardiologist:  Gordy Bergamo, MD   History of Present Illness: .   Keith Espinoza is a 83 y.o. Caucasian male patient who is very frail, chronically ill and wheelchair-bound with coronary artery disease with stenting starting in 2004 to RCA, LAD, CX, last cardiac catheterization in July 2024 revealing diffuse coronary artery disease and was turndown for CABG presents here for follow-up of CAD.   Past medical history significant for HFmREF with EF around 45% by echocardiogram on 05/16/2024 unchanged from 07/04/2023, hypertension, hypercholesterolemia, diabetes mellitus left below-knee amputation in 2018 due to diabetic foot and cellulitis and diabetic foot ulcer and also severe small vessel disease below-knee, stage IIIa chronic kidney disease and ongoing tobacco use disorder.    On his last office visit due to severe diffuse coronary artery disease and anginal symptoms, I had started him on Ranexa  but he developed severe nausea and discontinued this.  He is accompanied by his daughter.  He had emergency room visit with dizziness on 05/12/2024 and was started on meclizine  with improvement in symptoms, however was readmitted to the hospital on 05/14/2024 with hypotension and also had severe orthostasis leading to discontinuation of Entresto  and Imdur .  Since then patient has noticed improvement in wellbeing, has been using sublingual nitroglycerin  only for occasional chest discomfort.  Patient also states that his dizziness has improved since being on meclizine .  Cardiac Studies relevent.    CARDIAC CATHETERIZATION 04/25/2023    DES-LAD and Cfx 2004, DES RCA 2012, DES mCfx, PTCA dCfx 2013      ECHOCARDIOGRAM COMPLETE 05/16/2024  1. Left ventricular ejection fraction, by estimation, is 40 to 45%. The left ventricle has mildly  decreased function. The left ventricle demonstrates global hypokinesis. There is moderate concentric left ventricular hypertrophy. Left ventricular diastolic parameters are consistent with Grade I diastolic dysfunction (impaired relaxation). The average left ventricular global longitudinal strain is -12.5 %. The global longitudinal strain is abnormal. No significant change from 07/04/23     Discussed the use of AI scribe software for clinical note transcription with the patient, who gave verbal consent to proceed.  History of Present Illness Keith Espinoza is an 83 year old male with coronary artery disease and chronic diastolic heart failure who presents with symptoms of nausea, dizziness, and medication side effects. He is accompanied by his daughter, who is his primary caregiver.  Severe nausea and dizziness initially attributed to Lonox persisted despite discontinuation, leading to hospital admission. Meclizine  has improved these symptoms. Ejection fraction remains at 40-45%, unchanged from two years ago. Entresto  and Imdur  have been stopped, resulting in improved blood pressure. He is on Lasix  daily for fluid management, as every other day dosing led to swelling. Recent echocardiogram shows no change from previous results. Uses sublingual nitroglycerin  as needed for chest discomfort, described as 'sometimes don't feel right'. Significant weight loss of approximately 20 pounds due to recent illness and inability to eat. Diabetes is managed with Actos .  Labs   Lab Results  Component Value Date   CHOL 154 04/25/2023   HDL 57 04/25/2023   LDLCALC 87 04/25/2023   TRIG 52 04/25/2023   CHOLHDL 2.7 04/25/2023   Lipoprotein (a)  Date/Time Value Ref Range Status  04/26/2023 06:22 AM 16.1 <75.0 nmol/L Final    Comment:    (NOTE) Note:  Values greater than or  equal to 75.0 nmol/L may       indicate an independent risk factor for CHD,       but must be evaluated with caution when  applied       to non-Caucasian populations due to the       influence of genetic factors on Lp(a) across       ethnicities. Performed At: [facility] [address] [place], [place] [id number] Jennette Shorter MD Ey:[id number]     Recent Labs    05/14/24 1416 05/15/24 0354 05/16/24 0254  NA 137 138 138  K 3.7 3.0* 3.5  CL 97* 100 102  CO2 22 30 26   GLUCOSE 353* 153* 183*  BUN 21 18 17   CREATININE 1.46* 1.24 1.18  CALCIUM  10.0 8.9 8.9  GFRNONAA 47* 58* >60    Lab Results  Component Value Date   ALT 17 05/14/2024   AST 36 05/14/2024   ALKPHOS 105 05/14/2024   BILITOT 0.5 05/14/2024      Latest Ref Rng & Units 05/16/2024    2:54 AM 05/15/2024    3:54 AM 05/14/2024    2:16 PM  CBC  WBC 4.0 - 10.5 K/uL 7.7  7.6  11.3   Hemoglobin 13.0 - 17.0 g/dL 89.8  9.4  89.6   Hematocrit 39.0 - 52.0 % 29.9  27.3  30.1   Platelets 150 - 400 K/uL 131  113  132    Lab Results  Component Value Date   HGBA1C 7.7 (A) 12/30/2023    Lab Results  Component Value Date   TSH 1.160 04/05/2014    ROS  Review of Systems  Constitutional: Positive for malaise/fatigue and weight loss.  Cardiovascular:  Positive for chest pain. Negative for dyspnea on exertion and leg swelling.   Physical Exam:   VS:  BP 126/62   Pulse 80   Ht 5' 10 (1.778 m)   Wt 146 lb (66.2 kg)   SpO2 96%   BMI 20.95 kg/m    Wt Readings from Last 3 Encounters:  05/25/24 146 lb (66.2 kg)  02/24/24 157 lb 6.4 oz (71.4 kg)  12/30/23 160 lb 6.4 oz (72.8 kg)    BP Readings from Last 3 Encounters:  05/25/24 126/62  05/16/24 (!) 105/51  05/12/24 (!) 91/59   Physical Exam EKG:         ASSESSMENT AND PLAN: .      ICD-10-CM   1. Coronary artery disease involving native coronary artery of native heart without angina pectoris  I25.10 Do not attempt resuscitation (DNR)    2. Chronic diastolic heart failure (HCC)  P49.67 Do not attempt resuscitation (DNR)    3. Peripheral artery disease (HCC)  I73.9 Do not  attempt resuscitation (DNR)    4. Hypotension due to drugs  I95.2      Assessment & Plan Chronic diastolic heart failure with volume overload Chronic diastolic heart failure with ejection fraction of 40-45%. Volume overload managed with Lasix . Recent hospitalization due to nausea, vomiting, and dizziness, and dehydration, Imdur  and Entresto  discontinued with improvement in patient's wellbeing. Blood pressure improved after stopping Entresto  and Imdur . Focus on symptom management to improve quality of life. - Continue Lasix  daily. - If leg swelling, increased shortness of breath, or weight gain of 3 pounds in 3 days occurs, double Lasix  for a few days.  Coronary artery disease Coronary artery disease managed with sublingual nitroglycerin  as needed. Stopped Imdur  and Entresto  due to low blood pressure and adverse effects.  No significant changes in echocardiogram compared to two years ago. Prioritize symptom management over aggressive treatment to maintain quality of life. - Stop Imdur  permanently. - Continue sublingual nitroglycerin  as needed.  Type 2 diabetes mellitus (recommendation to discontinue Actos  due to fluid retention) Type 2 diabetes mellitus managed with Actos , which may contribute to fluid retention. Plan to discuss with endocrinologist about changing Actos  to another medication. - Discuss with endocrinologist about discontinuing Actos  and switching to an alternative medication.  Dizziness and nausea, improved Dizziness and nausea improved after discontinuation of Lonox. Symptoms managed with meclizine . Possible non-medication related cause considered due to persistence of symptoms after stopping Lonox. - Continue meclizine  as needed for dizziness.  Do not resuscitate status (DNR) DNR status confirmed and documented. Previous discussions about end-of-life care preferences, including no life support or CPR, have been documented. - Ensure DNR status is documented in the  chart.   Follow up: 6 months.  Signed,  Gordy Bergamo, MD, [facility] 05/26/2024, 9:46 AM [facility] [address] [place], [place] [address] Phone: [phone]. Fax:  [phone]

## 2024-06-01 DIAGNOSIS — N1831 Chronic kidney disease, stage 3a: Secondary | ICD-10-CM | POA: Diagnosis not present

## 2024-06-01 DIAGNOSIS — D631 Anemia in chronic kidney disease: Secondary | ICD-10-CM | POA: Diagnosis not present

## 2024-06-01 DIAGNOSIS — H353211 Exudative age-related macular degeneration, right eye, with active choroidal neovascularization: Secondary | ICD-10-CM | POA: Diagnosis not present

## 2024-06-01 DIAGNOSIS — I5042 Chronic combined systolic (congestive) and diastolic (congestive) heart failure: Secondary | ICD-10-CM | POA: Diagnosis not present

## 2024-06-01 DIAGNOSIS — I2510 Atherosclerotic heart disease of native coronary artery without angina pectoris: Secondary | ICD-10-CM | POA: Diagnosis not present

## 2024-06-01 DIAGNOSIS — Z89511 Acquired absence of right leg below knee: Secondary | ICD-10-CM | POA: Diagnosis not present

## 2024-06-01 DIAGNOSIS — M1A9XX0 Chronic gout, unspecified, without tophus (tophi): Secondary | ICD-10-CM | POA: Diagnosis not present

## 2024-06-01 DIAGNOSIS — Z794 Long term (current) use of insulin: Secondary | ICD-10-CM | POA: Diagnosis not present

## 2024-06-01 DIAGNOSIS — H353132 Nonexudative age-related macular degeneration, bilateral, intermediate dry stage: Secondary | ICD-10-CM | POA: Diagnosis not present

## 2024-06-01 DIAGNOSIS — I130 Hypertensive heart and chronic kidney disease with heart failure and stage 1 through stage 4 chronic kidney disease, or unspecified chronic kidney disease: Secondary | ICD-10-CM | POA: Diagnosis not present

## 2024-06-01 DIAGNOSIS — E1142 Type 2 diabetes mellitus with diabetic polyneuropathy: Secondary | ICD-10-CM | POA: Diagnosis not present

## 2024-06-01 DIAGNOSIS — Z7902 Long term (current) use of antithrombotics/antiplatelets: Secondary | ICD-10-CM | POA: Diagnosis not present

## 2024-06-01 DIAGNOSIS — Z7982 Long term (current) use of aspirin: Secondary | ICD-10-CM | POA: Diagnosis not present

## 2024-06-01 DIAGNOSIS — I350 Nonrheumatic aortic (valve) stenosis: Secondary | ICD-10-CM | POA: Diagnosis not present

## 2024-06-01 DIAGNOSIS — D500 Iron deficiency anemia secondary to blood loss (chronic): Secondary | ICD-10-CM | POA: Diagnosis not present

## 2024-06-01 DIAGNOSIS — Z87891 Personal history of nicotine dependence: Secondary | ICD-10-CM | POA: Diagnosis not present

## 2024-06-01 DIAGNOSIS — Z7984 Long term (current) use of oral hypoglycemic drugs: Secondary | ICD-10-CM | POA: Diagnosis not present

## 2024-06-01 DIAGNOSIS — E1151 Type 2 diabetes mellitus with diabetic peripheral angiopathy without gangrene: Secondary | ICD-10-CM | POA: Diagnosis not present

## 2024-06-01 DIAGNOSIS — E538 Deficiency of other specified B group vitamins: Secondary | ICD-10-CM | POA: Diagnosis not present

## 2024-06-01 DIAGNOSIS — E1122 Type 2 diabetes mellitus with diabetic chronic kidney disease: Secondary | ICD-10-CM | POA: Diagnosis not present

## 2024-06-01 DIAGNOSIS — I700 Atherosclerosis of aorta: Secondary | ICD-10-CM | POA: Diagnosis not present

## 2024-06-01 DIAGNOSIS — E1165 Type 2 diabetes mellitus with hyperglycemia: Secondary | ICD-10-CM | POA: Diagnosis not present

## 2024-06-03 DIAGNOSIS — I5043 Acute on chronic combined systolic (congestive) and diastolic (congestive) heart failure: Secondary | ICD-10-CM | POA: Diagnosis not present

## 2024-06-03 DIAGNOSIS — N1831 Chronic kidney disease, stage 3a: Secondary | ICD-10-CM | POA: Diagnosis not present

## 2024-06-03 DIAGNOSIS — I2510 Atherosclerotic heart disease of native coronary artery without angina pectoris: Secondary | ICD-10-CM | POA: Diagnosis not present

## 2024-06-03 DIAGNOSIS — I480 Paroxysmal atrial fibrillation: Secondary | ICD-10-CM | POA: Diagnosis not present

## 2024-06-06 DIAGNOSIS — E1151 Type 2 diabetes mellitus with diabetic peripheral angiopathy without gangrene: Secondary | ICD-10-CM | POA: Diagnosis not present

## 2024-06-06 DIAGNOSIS — E1142 Type 2 diabetes mellitus with diabetic polyneuropathy: Secondary | ICD-10-CM | POA: Diagnosis not present

## 2024-06-06 DIAGNOSIS — I5042 Chronic combined systolic (congestive) and diastolic (congestive) heart failure: Secondary | ICD-10-CM | POA: Diagnosis not present

## 2024-06-06 DIAGNOSIS — I130 Hypertensive heart and chronic kidney disease with heart failure and stage 1 through stage 4 chronic kidney disease, or unspecified chronic kidney disease: Secondary | ICD-10-CM | POA: Diagnosis not present

## 2024-06-06 DIAGNOSIS — E1165 Type 2 diabetes mellitus with hyperglycemia: Secondary | ICD-10-CM | POA: Diagnosis not present

## 2024-06-06 DIAGNOSIS — E1122 Type 2 diabetes mellitus with diabetic chronic kidney disease: Secondary | ICD-10-CM | POA: Diagnosis not present

## 2024-06-07 DIAGNOSIS — E1165 Type 2 diabetes mellitus with hyperglycemia: Secondary | ICD-10-CM | POA: Diagnosis not present

## 2024-06-07 DIAGNOSIS — E1122 Type 2 diabetes mellitus with diabetic chronic kidney disease: Secondary | ICD-10-CM | POA: Diagnosis not present

## 2024-06-07 DIAGNOSIS — E1151 Type 2 diabetes mellitus with diabetic peripheral angiopathy without gangrene: Secondary | ICD-10-CM | POA: Diagnosis not present

## 2024-06-07 DIAGNOSIS — E1142 Type 2 diabetes mellitus with diabetic polyneuropathy: Secondary | ICD-10-CM | POA: Diagnosis not present

## 2024-06-07 DIAGNOSIS — I130 Hypertensive heart and chronic kidney disease with heart failure and stage 1 through stage 4 chronic kidney disease, or unspecified chronic kidney disease: Secondary | ICD-10-CM | POA: Diagnosis not present

## 2024-06-07 DIAGNOSIS — I5042 Chronic combined systolic (congestive) and diastolic (congestive) heart failure: Secondary | ICD-10-CM | POA: Diagnosis not present

## 2024-06-08 ENCOUNTER — Ambulatory Visit (INDEPENDENT_AMBULATORY_CARE_PROVIDER_SITE_OTHER): Admitting: Internal Medicine

## 2024-06-08 ENCOUNTER — Encounter: Payer: Self-pay | Admitting: Internal Medicine

## 2024-06-08 ENCOUNTER — Other Ambulatory Visit: Payer: Self-pay | Admitting: Internal Medicine

## 2024-06-08 VITALS — BP 120/60 | HR 70 | Ht 70.0 in | Wt 148.0 lb

## 2024-06-08 DIAGNOSIS — Z794 Long term (current) use of insulin: Secondary | ICD-10-CM

## 2024-06-08 DIAGNOSIS — D3502 Benign neoplasm of left adrenal gland: Secondary | ICD-10-CM

## 2024-06-08 DIAGNOSIS — E1165 Type 2 diabetes mellitus with hyperglycemia: Secondary | ICD-10-CM | POA: Diagnosis not present

## 2024-06-08 DIAGNOSIS — E1142 Type 2 diabetes mellitus with diabetic polyneuropathy: Secondary | ICD-10-CM

## 2024-06-08 DIAGNOSIS — E1159 Type 2 diabetes mellitus with other circulatory complications: Secondary | ICD-10-CM | POA: Diagnosis not present

## 2024-06-08 DIAGNOSIS — Z89511 Acquired absence of right leg below knee: Secondary | ICD-10-CM

## 2024-06-08 LAB — POCT GLYCOSYLATED HEMOGLOBIN (HGB A1C): Hemoglobin A1C: 7.9 % — AB (ref 4.0–5.6)

## 2024-06-08 LAB — POCT GLUCOSE (DEVICE FOR HOME USE): POC Glucose: 108 mg/dL — AB (ref 70–99)

## 2024-06-08 MED ORDER — INSULIN PEN NEEDLE 32G X 4 MM MISC: 1.0000 | Freq: Four times a day (QID) | 3 refills | Status: AC

## 2024-06-08 MED ORDER — METFORMIN HCL 1000 MG PO TABS
1000.0000 mg | ORAL_TABLET | Freq: Two times a day (BID) | ORAL | Status: AC
Start: 2024-06-08 — End: ?

## 2024-06-08 MED ORDER — TRESIBA FLEXTOUCH 100 UNIT/ML ~~LOC~~ SOPN
14.0000 [IU] | PEN_INJECTOR | Freq: Every day | SUBCUTANEOUS | 3 refills | Status: AC
Start: 2024-06-08 — End: ?

## 2024-06-08 MED ORDER — EMPAGLIFLOZIN 25 MG PO TABS: 25.0000 mg | ORAL_TABLET | Freq: Every day | ORAL | 0 refills | Status: DC

## 2024-06-08 MED ORDER — NOVOLOG FLEXPEN 100 UNIT/ML ~~LOC~~ SOPN: PEN_INJECTOR | SUBCUTANEOUS | 3 refills | Status: AC

## 2024-06-08 NOTE — Patient Instructions (Addendum)
-   STOP Actos  (Pioglitazone ) - Continue Tresiba  14 units ONCE daily  - Continue Metformin  1000 mg Twice a day  - Continue Jardiance  25 mg daily  - Novolog  correctional insulin :  Use the scale below to help guide you before meals only  Blood sugar before meal Number of units to inject  90- 175 3 unit  176 - 215 4 units  216 - 255 5 units  256 - 295 6 units  296- 335 7 units  336 - 375 8 units  376 - 410 9 units        HOW TO TREAT LOW BLOOD SUGARS (Blood sugar LESS THAN 70 MG/DL) Please follow the RULE OF 15 for the treatment of hypoglycemia treatment (when your (blood sugars are less than 70 mg/dL)   STEP 1: Take 15 grams of carbohydrates when your blood sugar is low, which includes:  3-4 GLUCOSE TABS  OR 3-4 OZ OF JUICE OR REGULAR SODA OR ONE TUBE OF GLUCOSE GEL    STEP 2: RECHECK blood sugar in 15 MINUTES STEP 3: If your blood sugar is still low at the 15 minute recheck --> then, go back to STEP 1 and treat AGAIN with another 15 grams of carbohydrates.

## 2024-06-08 NOTE — Progress Notes (Signed)
 Name: Keith Espinoza  Age/ Sex: 83 y.o., male   MRN/ DOB: [id number], 02/26/1941     PCP: Charlott Dorn LABOR, MD   Reason for Endocrinology Evaluation: Type 2 Diabetes Mellitus  Initial Endocrine Consultative Visit: 03/24/2021    PATIENT IDENTIFIER: [name] is a 83 y.o. male with a past medical history of Right BK amputations due  PVD, T2DM and Pancreatitis .SABRA The patient has followed with Endocrinology clinic since 03/24/2021 for consultative assistance with management of his diabetes.  DIABETIC HISTORY:  Mr. Ahrendt was diagnosed with DM 2015, Trulicity caused weight loss, Has been on Glimepiride  in the past. His hemoglobin A1c has ranged from 6.6% in 2020, peaking at 7.7% in 2022.     Transitioned care from Dr. Luana in 2022    On his initial visit to our clinic he had an A1c 7.5 % He was on jardiance , Metformin  and Basal insulin , which I have reduced basal insulin    Started pioglitazoe 10/2021  Patient was diagnosed with DKA 06/2023 with elevated beta hydroxybutyrate at 5.1   ADRENAL HISTORY : Pt found to have a 1 cm left adrenal nodule on CT imaging of abdomen on 01/31/2021. Of note, the pt was noted to have hypokalemia , this is chronic and has been on KCL for ~ 5 yrs      Aldo, renin, 24-hr urinary cortisol and catecholamine have all come back normal 03/2021  He was noted with slight elevation of plasma normetanephrine and metanephrine 2023.  24-hour urine cortisol normal 04/2023.  His metanephrine 24-hour urine was canceled at the lab   SUBJECTIVE:   During the last visit (12/30/2023): A1c 7.7%     Today (06/08/2024): Mr. [name] is here for a follow up on diabetes management.  He is accompanied by his grand daughter . He checks his glucose multiple times a day through CGM, he forgot his receiver today. No hypoglycemia    Patient was hospitalized on August, 11, 2025 for lightheadedness and near syncope.  Negative orthostatics.  Metoprolol   and Ranexa  were discontinued.  Entresto  was on hold as well as Imdur .  He was also noticed with UTI on admission He continues to follow-up with ophthalmology for macular degeneration Patient is complaining that pioglitazone  causes his BP to drop  Continues with dizziness this am  No nausea or vomiting  Denies constipation or diarrhea  No palpitations   Eats 2 meals and snack   HOME DIABETES REGIMEN:  Tresiba  10 units daily - takes 14 units daily  Metformin  1000 mg Twice a day  Jardiance  25 mg daily Pioglitazone  30 mg daily  CF: Novolog  (BG-130/45) TIDQAC     Statin: Intolerant to Simvastatin due to severe myalgia  ACE-I/ARB: yes      CONTINUOUS GLUCOSE MONITORING RECORD INTERPRETATION: n/a   DIABETIC COMPLICATIONS: Microvascular complications:  Neuropathy, macular degeneration Denies: CKD Last Eye Exam: Completed 05/24/2024  Macrovascular complications:  CAD, PVD Denies: CVA   HISTORY:  Past Medical History:  Past Medical History:  Diagnosis Date   Allergic rhinitis    Anemia    Aortic stenosis    Arthritis    Basal cell carcinoma 11/01/2019    bcc left chest treatment TX cx3 6fu    CHF (congestive heart failure) (HCC)    Chronic leg pain    right   Chronic lower back pain    Coronary artery disease    a. Stenting to RCA 2004; staged DES to LAD and Cx 2004. DES to mRCA  2012. b. DES to mCx, PTCA to dCx 11/2011. c. Lateral wall MI 2013 s/p PTCA to distal Cx & DES to mid OM2 11/2011. d. Low risk nuc 04/2014, EF wnl.   COVID-19    Diabetes mellitus    Insulin  dependent   Diabetic neuropathy (HCC)    MILD   Diverticulosis    Dysrhythmia    Bertrum syndrome    Gout    right wrist; right foot; right elbow; have had it since 1970's   H/O hiatal hernia    Heart murmur    History of echocardiogram    aortic sclerosis per echo 12/09 EF 65%, otherwise normal   History of hemorrhoids    BLEEDING   History of kidney stones    h/o   Hypertension    Diagnosed  1995    Myocardial infarction [facility])    Pancreatic pseudocyst    a. s/p remote drainage 2006.   Thrombocytopenia (HCC)    Seen on oldest labs in system from 2004   Vitamin B 12 deficiency    orally replaced   Past Surgical History:  Past Surgical History:  Procedure Laterality Date   ABDOMINAL AORTOGRAM W/LOWER EXTREMITY Bilateral 08/08/2020   Procedure: ABDOMINAL AORTOGRAM W/LOWER EXTREMITY;  Surgeon: Eliza Lonni RAMAN, MD;  Location: [facility] INVASIVE CV LAB;  Service: Cardiovascular;  Laterality: Bilateral;   AMPUTATION Right 12/12/2020   Procedure: RIGHT BELOW KNEE AMPUTATION;  Surgeon: Harden Jerona GAILS, MD;  Location: [facility] OR;  Service: Orthopedics;  Laterality: Right;   BACK SURGERY     total of 3 times S/P fall    CARPAL TUNNEL RELEASE Bilateral    CHOLECYSTECTOMY  1990's   CIRCUMCISION N/A 10/12/2023   Procedure: Dorsal slit;  Surgeon: [name], MD;  Location: [facility] ORS;  Service: Urology;  Laterality: N/A;   COLONOSCOPY     COLONOSCOPY Left 07/08/2023   Procedure: COLONOSCOPY;  Surgeon: San Sandor GAILS, DO;  Location: [facility] ENDOSCOPY;  Service: Gastroenterology;  Laterality: Left;   CORONARY ANGIOPLASTY  11/11/11   CORONARY ANGIOPLASTY WITH STENT PLACEMENT  09/30/2011   1 then; makes a total of 4   CORONARY ANGIOPLASTY WITH STENT PLACEMENT  11/11/11   1; makes a total of 5   ESOPHAGOGASTRODUODENOSCOPY Left 07/08/2023   Procedure: ESOPHAGOGASTRODUODENOSCOPY (EGD);  Surgeon: San Sandor GAILS, DO;  Location: [facility] ENDOSCOPY;  Service: Gastroenterology;  Laterality: Left;   HEMOSTASIS CLIP PLACEMENT  07/08/2023   Procedure: HEMOSTASIS CLIP PLACEMENT;  Surgeon: San Sandor GAILS, DO;  Location: [facility] ENDOSCOPY;  Service: Gastroenterology;;   HOT HEMOSTASIS N/A 07/08/2023   Procedure: HOT HEMOSTASIS (ARGON PLASMA COAGULATION/BICAP);  Surgeon: San Sandor GAILS, DO;  Location: [facility] ENDOSCOPY;  Service: Gastroenterology;  Laterality: N/A;   INGUINAL HERNIA REPAIR  2003   right   JOINT  REPLACEMENT Right 04/03/2002   hip replacment   KNEE ARTHROSCOPY  1990's   left   LEFT HEART CATH AND CORONARY ANGIOGRAPHY N/A 04/25/2023   Procedure: LEFT HEART CATH AND CORONARY ANGIOGRAPHY;  Surgeon: Anner Alm ORN, MD;  Location: [facility] INVASIVE CV LAB;  Service: Cardiovascular;  Laterality: N/A;   LEFT HEART CATHETERIZATION WITH CORONARY ANGIOGRAM N/A 09/30/2011   Procedure: LEFT HEART CATHETERIZATION WITH CORONARY ANGIOGRAM;  Surgeon: Candyce RAMAN Reek, MD;  Location: [facility] CATH LAB;  Service: Cardiovascular;  Laterality: N/A;  possible PCI   LEFT HEART CATHETERIZATION WITH CORONARY ANGIOGRAM N/A 11/15/2011   Procedure: LEFT HEART CATHETERIZATION WITH CORONARY ANGIOGRAM;  Surgeon: Candyce RAMAN Reek, MD;  Location: [facility] CATH  LAB;  Service: Cardiovascular;  Laterality: N/A;   PERCUTANEOUS CORONARY STENT INTERVENTION (PCI-S)  09/30/2011   Procedure: PERCUTANEOUS CORONARY STENT INTERVENTION (PCI-S);  Surgeon: Candyce GORMAN Reek, MD;  Location: [facility] CATH LAB;  Service: Cardiovascular;;   PERCUTANEOUS CORONARY STENT INTERVENTION (PCI-S) N/A 11/11/2011   Procedure: PERCUTANEOUS CORONARY STENT INTERVENTION (PCI-S);  Surgeon: Candyce GORMAN Reek, MD;  Location: [facility] CATH LAB;  Service: Cardiovascular;  Laterality: N/A;   PERIPHERAL VASCULAR BALLOON ANGIOPLASTY Right 08/08/2020   Procedure: PERIPHERAL VASCULAR BALLOON ANGIOPLASTY;  Surgeon: Eliza Lonni GORMAN, MD;  Location: [facility] INVASIVE CV LAB;  Service: Cardiovascular;  Laterality: Right;  Posterior tibial    SHOULDER SURGERY Right    X 2   STUMP REVISION Right 01/09/2021   Procedure: REVISION RIGHT BELOW KNEE AMPUTATION;  Surgeon: Harden Jerona GAILS, MD;  Location: [facility] OR;  Service: Orthopedics;  Laterality: Right;   TONSILLECTOMY  ~ 1948   TOTAL HIP REVISION Right 04/13/2019   Procedure: RIGHT TOTAL HIP REVISION-POSTERIOR  APPROACH LATERAL;  Surgeon: Barbarann Oneil BROCKS, MD;  Location: [facility] OR;  Service: Orthopedics;  Laterality: Right;   TOTAL KNEE ARTHROPLASTY Left  07/23/2016   Procedure: LEFT TOTAL KNEE ARTHROPLASTY;  Surgeon: Oneil BROCKS Barbarann, MD;  Location: [facility] OR;  Service: Orthopedics;  Laterality: Left;   Social History:  reports that he has quit smoking. His smoking use included cigarettes. His smokeless tobacco use includes chew. He reports that he does not drink alcohol and does not use drugs. Family History:  Family History  Problem Relation Age of Onset   Diabetes Mother    Hyperlipidemia Mother    Hypertension Mother    Cancer Father    Hypertension Father    Diabetes Sister    Hypertension Sister    Cancer Brother    Heart attack Neg Hx      HOME MEDICATIONS: Allergies as of 06/08/2024       Reactions   Simvastatin Other (See Comments)   SEVERE MYALGIAS   Zetia [ezetimibe] Other (See Comments)   MYALGIAS   Dilaudid  [hydromorphone  Hcl] Other (See Comments)   hallucination        Medication List        Accurate as of June 08, 2024 10:47 AM. If you have any questions, ask your nurse or doctor.          Acetaminophen  Extra Strength 500 MG Tabs Take 2 tablets (1,000 mg total) by mouth every 6 (six) hours. What changed:  when to take this reasons to take this   allopurinol  300 MG tablet Commonly known as: ZYLOPRIM  Take 300 mg by mouth daily.   aspirin  EC 81 MG tablet Take 81 mg by mouth daily. Swallow whole.   B-12 1000 MCG Tabs Take 1,000 mcg by mouth daily.   clopidogrel  75 MG tablet Commonly known as: PLAVIX  Take 1 tablet (75 mg total) by mouth daily.   Dexcom G7 Sensor Misc 1 Device by Does not apply route as directed.   Fish Oil 500 MG Caps Take 500 mg by mouth daily.   furosemide  20 MG tablet Commonly known as: LASIX  Take 1 tablet (20 mg total) by mouth daily.   gabapentin  400 MG capsule Commonly known as: NEURONTIN  Take 1 capsule (400 mg total) by mouth daily.   Insulin  Pen Needle 32G X 4 MM Misc 1 Device by Does not apply route in the morning, at noon, in the evening, and at bedtime.    Jardiance  25 MG Tabs tablet Generic drug: empagliflozin  TAKE  1 TABLET BY MOUTH DAILY   Klor-Con  M20 20 MEQ tablet Generic drug: potassium chloride  SA Take 20 mEq by mouth daily.   Magnesium  400 MG Caps Take 400 mg by mouth daily.   metFORMIN  1000 MG tablet Commonly known as: GLUCOPHAGE  Take 1 tablet (1,000 mg total) by mouth 2 (two) times daily.   nitroGLYCERIN  0.4 MG SL tablet Commonly known as: NITROSTAT  Place 1 tablet (0.4 mg total) under the tongue every 5 (five) minutes x 3 doses as needed for chest pain.   NovoLOG  FlexPen 100 UNIT/ML FlexPen Generic drug: insulin  aspart Max daily 30 units What changed:  how to take this when to take this reasons to take this   pioglitazone  30 MG tablet Commonly known as: ACTOS  Take 1 tablet (30 mg total) by mouth daily.   QC TUMERIC COMPLEX PO Take 500 mg by mouth daily.   rosuvastatin  20 MG tablet Commonly known as: CRESTOR  Take 1 tablet (20 mg total) by mouth daily.   senna-docusate 8.6-50 MG tablet Commonly known as: Senokot-S Take 1 tablet by mouth at bedtime. What changed: when to take this   terazosin  5 MG capsule Commonly known as: HYTRIN  Take 5 mg by mouth 2 (two) times daily.   Tresiba  FlexTouch 100 UNIT/ML FlexTouch Pen Generic drug: insulin  degludec Inject 10 Units into the skin daily.   VITAMIN C PO Take 1 tablet by mouth daily.   Vitamin D3 50 MCG (2000 UT) Tabs Take 2,000 Units by mouth daily.         OBJECTIVE:   Vital Signs:There were no vitals taken for this visit.   Wt Readings from Last 3 Encounters:  05/25/24 146 lb (66.2 kg)  02/24/24 157 lb 6.4 oz (71.4 kg)  12/30/23 160 lb 6.4 oz (72.8 kg)     Exam: General: Pt appears well and is in NAD  Neck: General: Supple without adenopathy. Thyroid : Thyroid  size normal.  No goiter or nodules appreciated.   Lungs: Clear with good BS bilat   Heart: RRR  LE: Right BKA, left lower extremity edema  Neuro: MS is good with appropriate  affect, pt is alert and Ox3   DM Foot Exam 06/08/2024    The skin of the left foot is without sores or ulcerations. The pedal pulse and detectable The sensation is decreased to a screening 5.07, 10 gram monofilament on the left   DATA REVIEWED:  Lab Results  Component Value Date   HGBA1C 7.7 (A) 12/30/2023   HGBA1C 9.0 (H) 09/29/2023   HGBA1C 7.1 (A) 04/01/2023    Latest Reference Range & Units 09/29/23 10:18  Sodium 135 - 145 mmol/L 137  Potassium 3.5 - 5.1 mmol/L 3.5  Chloride 98 - 111 mmol/L 100  CO2 22 - 32 mmol/L 26  Glucose 70 - 99 mg/dL 741 (H)  Mean Plasma Glucose mg/dL 787  BUN 8 - 23 mg/dL 24 (H)  Creatinine 9.38 - 1.24 mg/dL 8.86  Calcium  8.9 - 10.3 mg/dL 9.7  Anion gap 5 - 15  11  GFR, Estimated >60 mL/min >60  (H): Data is abnormally high  CT abdomen 01/31/2021   Indeterminate 10 mm left adrenal nodule, likely adenoma.     MRI abdomen 12/2022 Adrenals/Urinary tract: 1.9 x 1.3 cm left adrenal mass is seen which shows signal dropout on chemical shift imaging, consistent with a benign adrenal adenoma. This is also stable compared to previous CT. No suspicious renal masses identified. No evidence of hydronephrosis.    ASSESSMENT / PLAN /  RECOMMENDATIONS:   1) Type 2 Diabetes Mellitus, suboptimally controlled, With neuropathic and macrovascular  complications - Most recent A1c of 7.9 %. Goal A1c < 7.5 %.    - A1c slightly above goal - NO CGM data again today, as he has forgotten his receiver - Pt with hx of pancreatitis , hence DPP-4 inhibitors and GLP-1 agonists are contra-indicated.  - Most likely patient with postprandial hyperglycemia, I will change his NovoLog  as below - Will discontinue pioglitazone  - Patient was advised to avoid propel, and to use Crystal light with water instead    MEDICATIONS: Stop pioglitazone  Continue Tresiba  14 units daily  Continue Metformin  1000 mg Twice a day  Continue Jardiance  25 mg daily NovoLog  3 units with each  meal Continue correction factor: NovoLog  (BG -130/45) with each meal    EDUCATION / INSTRUCTIONS: BG monitoring instructions: Patient is instructed to check his blood sugars 1 times a day, fasting . Call [facility] if: BG persistently < 70  I reviewed the Rule of 15 for the treatment of hypoglycemia in detail with the patient. Literature supplied.    2) Diabetic complications:  Eye:  Unknown to have diabetic retinopathy.  Neuro/ Feet: Does  have known diabetic peripheral neuropathy .  Renal: Patient does not have known baseline CKD. He   is  on an ACEI/ARB at present.    3) Left Adrenal Adenoma:   - Hormonal workup has ruled out cushing syndrome (2022), Pheo and hyperaldo 06/2022 .Aldo, renin, and 24-hour urinary cortisol were normal in 2024 . -He was noted with slight elevation of plasma normetanephrine, but unfortunately his 24-hour urine sample was canceled through lab, he did not resubmit the sample in July, 2024 - Repeat adrenal imaging showed stability 12/2022 - no further imaging recommended  - Since he had recent hospitalization, we have opted to hold off on any adrenal testing to avoid false readings due to stress of hospitalization/infection   F/U in 6 months      Signed electronically by: Stefano Redgie Butts, MD  [facility]  [facility] [address] [place] [place], [place] [address] Phone: [phone] FAX: [phone]   CC: Charlott Dorn LABOR, MD [address] [place] [address] Phone: [phone]  Fax: [phone]  Return to Endocrinology clinic as below: Future Appointments  Date Time Provider Department Center  06/08/2024 10:50 AM Mariam Helbert, Donell Redgie, MD [facility] None  06/22/2024  9:15 AM [facility] 3 [facility] H&V  09/14/2024 12:00 PM [facility] 4 [facility]UL[facility] H&V  09/14/2024  1:00 PM [facility] PA [facility] H&V

## 2024-06-11 ENCOUNTER — Other Ambulatory Visit: Payer: Self-pay | Admitting: Internal Medicine

## 2024-06-11 ENCOUNTER — Telehealth: Payer: Self-pay

## 2024-06-11 ENCOUNTER — Other Ambulatory Visit (HOSPITAL_COMMUNITY): Payer: Self-pay

## 2024-06-11 ENCOUNTER — Other Ambulatory Visit: Payer: Self-pay

## 2024-06-11 DIAGNOSIS — E1142 Type 2 diabetes mellitus with diabetic polyneuropathy: Secondary | ICD-10-CM | POA: Diagnosis not present

## 2024-06-11 DIAGNOSIS — I5042 Chronic combined systolic (congestive) and diastolic (congestive) heart failure: Secondary | ICD-10-CM | POA: Diagnosis not present

## 2024-06-11 DIAGNOSIS — E1165 Type 2 diabetes mellitus with hyperglycemia: Secondary | ICD-10-CM | POA: Diagnosis not present

## 2024-06-11 DIAGNOSIS — E1122 Type 2 diabetes mellitus with diabetic chronic kidney disease: Secondary | ICD-10-CM | POA: Diagnosis not present

## 2024-06-11 DIAGNOSIS — I130 Hypertensive heart and chronic kidney disease with heart failure and stage 1 through stage 4 chronic kidney disease, or unspecified chronic kidney disease: Secondary | ICD-10-CM | POA: Diagnosis not present

## 2024-06-11 DIAGNOSIS — E1151 Type 2 diabetes mellitus with diabetic peripheral angiopathy without gangrene: Secondary | ICD-10-CM | POA: Diagnosis not present

## 2024-06-11 MED ORDER — INSULIN LISPRO (1 UNIT DIAL) 100 UNIT/ML (KWIKPEN): PEN_INJECTOR | SUBCUTANEOUS | 3 refills | Status: DC

## 2024-06-11 NOTE — Telephone Encounter (Signed)
 Pharmacy Patient Advocate Encounter   Received notification from RX Request Messages that prior authorization for Humalog  is required/requested.   Insurance verification completed.   The patient is insured through [employer] and [facility] MEDICARE.   Per test claim:  insulin  aspart (generic novolog ) is preferred by the insurance.  If suggested medication is appropriate, Please send in a new RX and discontinue this one. If not, please advise as to why it's not appropriate so that we may request a Prior Authorization. Please note, some preferred medications may still require a PA.  If the suggested medications have not been trialed and there are no contraindications to their use, the PA will not be submitted, as it will not be approved.   30 day copay is $0. Medicare must be ran as primary, express scripts secondary.

## 2024-06-11 NOTE — Telephone Encounter (Signed)
 Okay to switch the Novolog  to Humalog  per Dr. Sam.

## 2024-06-12 NOTE — Telephone Encounter (Signed)
 Spoke with Pharamacy an advise the the generic of Novolog  is actually what insurance prefers. They ran claim and it went through. They will have to order medication. They will notify patient when ready

## 2024-06-14 DIAGNOSIS — H353221 Exudative age-related macular degeneration, left eye, with active choroidal neovascularization: Secondary | ICD-10-CM | POA: Diagnosis not present

## 2024-06-14 DIAGNOSIS — H353211 Exudative age-related macular degeneration, right eye, with active choroidal neovascularization: Secondary | ICD-10-CM | POA: Diagnosis not present

## 2024-06-14 DIAGNOSIS — H353133 Nonexudative age-related macular degeneration, bilateral, advanced atrophic without subfoveal involvement: Secondary | ICD-10-CM | POA: Diagnosis not present

## 2024-06-14 DIAGNOSIS — E113393 Type 2 diabetes mellitus with moderate nonproliferative diabetic retinopathy without macular edema, bilateral: Secondary | ICD-10-CM | POA: Diagnosis not present

## 2024-06-18 DIAGNOSIS — I5042 Chronic combined systolic (congestive) and diastolic (congestive) heart failure: Secondary | ICD-10-CM | POA: Diagnosis not present

## 2024-06-18 DIAGNOSIS — E1165 Type 2 diabetes mellitus with hyperglycemia: Secondary | ICD-10-CM | POA: Diagnosis not present

## 2024-06-18 DIAGNOSIS — E1151 Type 2 diabetes mellitus with diabetic peripheral angiopathy without gangrene: Secondary | ICD-10-CM | POA: Diagnosis not present

## 2024-06-18 DIAGNOSIS — E1122 Type 2 diabetes mellitus with diabetic chronic kidney disease: Secondary | ICD-10-CM | POA: Diagnosis not present

## 2024-06-18 DIAGNOSIS — E1142 Type 2 diabetes mellitus with diabetic polyneuropathy: Secondary | ICD-10-CM | POA: Diagnosis not present

## 2024-06-18 DIAGNOSIS — I130 Hypertensive heart and chronic kidney disease with heart failure and stage 1 through stage 4 chronic kidney disease, or unspecified chronic kidney disease: Secondary | ICD-10-CM | POA: Diagnosis not present

## 2024-06-22 ENCOUNTER — Ambulatory Visit (HOSPITAL_COMMUNITY)

## 2024-06-25 DIAGNOSIS — E1122 Type 2 diabetes mellitus with diabetic chronic kidney disease: Secondary | ICD-10-CM | POA: Diagnosis not present

## 2024-06-25 DIAGNOSIS — E1151 Type 2 diabetes mellitus with diabetic peripheral angiopathy without gangrene: Secondary | ICD-10-CM | POA: Diagnosis not present

## 2024-06-25 DIAGNOSIS — I130 Hypertensive heart and chronic kidney disease with heart failure and stage 1 through stage 4 chronic kidney disease, or unspecified chronic kidney disease: Secondary | ICD-10-CM | POA: Diagnosis not present

## 2024-06-25 DIAGNOSIS — E1142 Type 2 diabetes mellitus with diabetic polyneuropathy: Secondary | ICD-10-CM | POA: Diagnosis not present

## 2024-06-25 DIAGNOSIS — E1165 Type 2 diabetes mellitus with hyperglycemia: Secondary | ICD-10-CM | POA: Diagnosis not present

## 2024-06-25 DIAGNOSIS — I5042 Chronic combined systolic (congestive) and diastolic (congestive) heart failure: Secondary | ICD-10-CM | POA: Diagnosis not present

## 2024-07-01 DIAGNOSIS — H353211 Exudative age-related macular degeneration, right eye, with active choroidal neovascularization: Secondary | ICD-10-CM | POA: Diagnosis not present

## 2024-07-01 DIAGNOSIS — N1831 Chronic kidney disease, stage 3a: Secondary | ICD-10-CM | POA: Diagnosis not present

## 2024-07-01 DIAGNOSIS — Z7982 Long term (current) use of aspirin: Secondary | ICD-10-CM | POA: Diagnosis not present

## 2024-07-01 DIAGNOSIS — E1151 Type 2 diabetes mellitus with diabetic peripheral angiopathy without gangrene: Secondary | ICD-10-CM | POA: Diagnosis not present

## 2024-07-01 DIAGNOSIS — E1142 Type 2 diabetes mellitus with diabetic polyneuropathy: Secondary | ICD-10-CM | POA: Diagnosis not present

## 2024-07-01 DIAGNOSIS — D631 Anemia in chronic kidney disease: Secondary | ICD-10-CM | POA: Diagnosis not present

## 2024-07-01 DIAGNOSIS — Z794 Long term (current) use of insulin: Secondary | ICD-10-CM | POA: Diagnosis not present

## 2024-07-01 DIAGNOSIS — M1A9XX0 Chronic gout, unspecified, without tophus (tophi): Secondary | ICD-10-CM | POA: Diagnosis not present

## 2024-07-01 DIAGNOSIS — I2510 Atherosclerotic heart disease of native coronary artery without angina pectoris: Secondary | ICD-10-CM | POA: Diagnosis not present

## 2024-07-01 DIAGNOSIS — H353132 Nonexudative age-related macular degeneration, bilateral, intermediate dry stage: Secondary | ICD-10-CM | POA: Diagnosis not present

## 2024-07-01 DIAGNOSIS — D500 Iron deficiency anemia secondary to blood loss (chronic): Secondary | ICD-10-CM | POA: Diagnosis not present

## 2024-07-01 DIAGNOSIS — Z7984 Long term (current) use of oral hypoglycemic drugs: Secondary | ICD-10-CM | POA: Diagnosis not present

## 2024-07-01 DIAGNOSIS — Z7902 Long term (current) use of antithrombotics/antiplatelets: Secondary | ICD-10-CM | POA: Diagnosis not present

## 2024-07-01 DIAGNOSIS — E1165 Type 2 diabetes mellitus with hyperglycemia: Secondary | ICD-10-CM | POA: Diagnosis not present

## 2024-07-01 DIAGNOSIS — E538 Deficiency of other specified B group vitamins: Secondary | ICD-10-CM | POA: Diagnosis not present

## 2024-07-01 DIAGNOSIS — I700 Atherosclerosis of aorta: Secondary | ICD-10-CM | POA: Diagnosis not present

## 2024-07-01 DIAGNOSIS — I350 Nonrheumatic aortic (valve) stenosis: Secondary | ICD-10-CM | POA: Diagnosis not present

## 2024-07-01 DIAGNOSIS — E1122 Type 2 diabetes mellitus with diabetic chronic kidney disease: Secondary | ICD-10-CM | POA: Diagnosis not present

## 2024-07-01 DIAGNOSIS — Z89511 Acquired absence of right leg below knee: Secondary | ICD-10-CM | POA: Diagnosis not present

## 2024-07-01 DIAGNOSIS — I5042 Chronic combined systolic (congestive) and diastolic (congestive) heart failure: Secondary | ICD-10-CM | POA: Diagnosis not present

## 2024-07-01 DIAGNOSIS — I130 Hypertensive heart and chronic kidney disease with heart failure and stage 1 through stage 4 chronic kidney disease, or unspecified chronic kidney disease: Secondary | ICD-10-CM | POA: Diagnosis not present

## 2024-07-01 DIAGNOSIS — Z87891 Personal history of nicotine dependence: Secondary | ICD-10-CM | POA: Diagnosis not present

## 2024-07-02 DIAGNOSIS — E1165 Type 2 diabetes mellitus with hyperglycemia: Secondary | ICD-10-CM | POA: Diagnosis not present

## 2024-07-02 DIAGNOSIS — I5042 Chronic combined systolic (congestive) and diastolic (congestive) heart failure: Secondary | ICD-10-CM | POA: Diagnosis not present

## 2024-07-02 DIAGNOSIS — I130 Hypertensive heart and chronic kidney disease with heart failure and stage 1 through stage 4 chronic kidney disease, or unspecified chronic kidney disease: Secondary | ICD-10-CM | POA: Diagnosis not present

## 2024-07-02 DIAGNOSIS — E1151 Type 2 diabetes mellitus with diabetic peripheral angiopathy without gangrene: Secondary | ICD-10-CM | POA: Diagnosis not present

## 2024-07-02 DIAGNOSIS — E1122 Type 2 diabetes mellitus with diabetic chronic kidney disease: Secondary | ICD-10-CM | POA: Diagnosis not present

## 2024-07-02 DIAGNOSIS — E1142 Type 2 diabetes mellitus with diabetic polyneuropathy: Secondary | ICD-10-CM | POA: Diagnosis not present

## 2024-07-10 DIAGNOSIS — I130 Hypertensive heart and chronic kidney disease with heart failure and stage 1 through stage 4 chronic kidney disease, or unspecified chronic kidney disease: Secondary | ICD-10-CM | POA: Diagnosis not present

## 2024-07-10 DIAGNOSIS — E1142 Type 2 diabetes mellitus with diabetic polyneuropathy: Secondary | ICD-10-CM | POA: Diagnosis not present

## 2024-07-10 DIAGNOSIS — E1122 Type 2 diabetes mellitus with diabetic chronic kidney disease: Secondary | ICD-10-CM | POA: Diagnosis not present

## 2024-07-10 DIAGNOSIS — E1165 Type 2 diabetes mellitus with hyperglycemia: Secondary | ICD-10-CM | POA: Diagnosis not present

## 2024-07-10 DIAGNOSIS — E1151 Type 2 diabetes mellitus with diabetic peripheral angiopathy without gangrene: Secondary | ICD-10-CM | POA: Diagnosis not present

## 2024-07-10 DIAGNOSIS — I5042 Chronic combined systolic (congestive) and diastolic (congestive) heart failure: Secondary | ICD-10-CM | POA: Diagnosis not present

## 2024-07-15 DIAGNOSIS — Z23 Encounter for immunization: Secondary | ICD-10-CM | POA: Diagnosis not present

## 2024-07-18 DIAGNOSIS — E1151 Type 2 diabetes mellitus with diabetic peripheral angiopathy without gangrene: Secondary | ICD-10-CM | POA: Diagnosis not present

## 2024-07-18 DIAGNOSIS — E1165 Type 2 diabetes mellitus with hyperglycemia: Secondary | ICD-10-CM | POA: Diagnosis not present

## 2024-07-18 DIAGNOSIS — E1142 Type 2 diabetes mellitus with diabetic polyneuropathy: Secondary | ICD-10-CM | POA: Diagnosis not present

## 2024-07-18 DIAGNOSIS — E1122 Type 2 diabetes mellitus with diabetic chronic kidney disease: Secondary | ICD-10-CM | POA: Diagnosis not present

## 2024-07-18 DIAGNOSIS — I130 Hypertensive heart and chronic kidney disease with heart failure and stage 1 through stage 4 chronic kidney disease, or unspecified chronic kidney disease: Secondary | ICD-10-CM | POA: Diagnosis not present

## 2024-07-18 DIAGNOSIS — I5042 Chronic combined systolic (congestive) and diastolic (congestive) heart failure: Secondary | ICD-10-CM | POA: Diagnosis not present

## 2024-07-18 NOTE — Telephone Encounter (Signed)
 All done

## 2024-07-19 DIAGNOSIS — H353133 Nonexudative age-related macular degeneration, bilateral, advanced atrophic without subfoveal involvement: Secondary | ICD-10-CM | POA: Diagnosis not present

## 2024-07-19 DIAGNOSIS — H353211 Exudative age-related macular degeneration, right eye, with active choroidal neovascularization: Secondary | ICD-10-CM | POA: Diagnosis not present

## 2024-07-19 DIAGNOSIS — E113393 Type 2 diabetes mellitus with moderate nonproliferative diabetic retinopathy without macular edema, bilateral: Secondary | ICD-10-CM | POA: Diagnosis not present

## 2024-07-19 DIAGNOSIS — H353221 Exudative age-related macular degeneration, left eye, with active choroidal neovascularization: Secondary | ICD-10-CM | POA: Diagnosis not present

## 2024-07-25 ENCOUNTER — Other Ambulatory Visit: Payer: Self-pay

## 2024-07-25 DIAGNOSIS — E1151 Type 2 diabetes mellitus with diabetic peripheral angiopathy without gangrene: Secondary | ICD-10-CM | POA: Diagnosis not present

## 2024-07-25 DIAGNOSIS — I130 Hypertensive heart and chronic kidney disease with heart failure and stage 1 through stage 4 chronic kidney disease, or unspecified chronic kidney disease: Secondary | ICD-10-CM | POA: Diagnosis not present

## 2024-07-25 DIAGNOSIS — E1122 Type 2 diabetes mellitus with diabetic chronic kidney disease: Secondary | ICD-10-CM | POA: Diagnosis not present

## 2024-07-25 DIAGNOSIS — E1142 Type 2 diabetes mellitus with diabetic polyneuropathy: Secondary | ICD-10-CM | POA: Diagnosis not present

## 2024-07-25 DIAGNOSIS — E1165 Type 2 diabetes mellitus with hyperglycemia: Secondary | ICD-10-CM | POA: Diagnosis not present

## 2024-07-25 DIAGNOSIS — I5042 Chronic combined systolic (congestive) and diastolic (congestive) heart failure: Secondary | ICD-10-CM | POA: Diagnosis not present

## 2024-07-27 MED ORDER — ROSUVASTATIN CALCIUM 20 MG PO TABS: 20.0000 mg | ORAL_TABLET | Freq: Every day | ORAL | 2 refills | Status: DC

## 2024-08-06 ENCOUNTER — Encounter: Payer: Self-pay | Admitting: Radiology

## 2024-08-07 ENCOUNTER — Other Ambulatory Visit: Payer: Self-pay

## 2024-08-07 DIAGNOSIS — I739 Peripheral vascular disease, unspecified: Secondary | ICD-10-CM

## 2024-09-09 ENCOUNTER — Encounter (HOSPITAL_BASED_OUTPATIENT_CLINIC_OR_DEPARTMENT_OTHER): Payer: Self-pay | Admitting: Emergency Medicine

## 2024-09-09 ENCOUNTER — Emergency Department (HOSPITAL_BASED_OUTPATIENT_CLINIC_OR_DEPARTMENT_OTHER)

## 2024-09-09 ENCOUNTER — Other Ambulatory Visit: Payer: Self-pay

## 2024-09-09 ENCOUNTER — Emergency Department (HOSPITAL_BASED_OUTPATIENT_CLINIC_OR_DEPARTMENT_OTHER): Admission: EM | Admit: 2024-09-09 | Discharge: 2024-09-09 | Disposition: A | Discharge status: Home / Self Care

## 2024-09-09 DIAGNOSIS — N400 Enlarged prostate without lower urinary tract symptoms: Secondary | ICD-10-CM | POA: Diagnosis not present

## 2024-09-09 DIAGNOSIS — Z794 Long term (current) use of insulin: Secondary | ICD-10-CM | POA: Diagnosis not present

## 2024-09-09 DIAGNOSIS — R1030 Lower abdominal pain, unspecified: Secondary | ICD-10-CM | POA: Diagnosis not present

## 2024-09-09 DIAGNOSIS — Z7984 Long term (current) use of oral hypoglycemic drugs: Secondary | ICD-10-CM | POA: Diagnosis not present

## 2024-09-09 DIAGNOSIS — R1084 Generalized abdominal pain: Secondary | ICD-10-CM | POA: Insufficient documentation

## 2024-09-09 DIAGNOSIS — Z7901 Long term (current) use of anticoagulants: Secondary | ICD-10-CM | POA: Insufficient documentation

## 2024-09-09 DIAGNOSIS — R109 Unspecified abdominal pain: Secondary | ICD-10-CM

## 2024-09-09 DIAGNOSIS — J90 Pleural effusion, not elsewhere classified: Secondary | ICD-10-CM | POA: Diagnosis not present

## 2024-09-09 DIAGNOSIS — I10 Essential (primary) hypertension: Secondary | ICD-10-CM | POA: Insufficient documentation

## 2024-09-09 DIAGNOSIS — N200 Calculus of kidney: Secondary | ICD-10-CM | POA: Diagnosis not present

## 2024-09-09 DIAGNOSIS — Z7982 Long term (current) use of aspirin: Secondary | ICD-10-CM | POA: Insufficient documentation

## 2024-09-09 DIAGNOSIS — J9811 Atelectasis: Secondary | ICD-10-CM | POA: Diagnosis not present

## 2024-09-09 DIAGNOSIS — E119 Type 2 diabetes mellitus without complications: Secondary | ICD-10-CM | POA: Diagnosis not present

## 2024-09-09 LAB — COMPREHENSIVE METABOLIC PANEL WITH GFR
ALT: 25 U/L (ref 0–44)
AST: 21 U/L (ref 15–41)
Albumin: 3.8 g/dL (ref 3.5–5.0)
Alkaline Phosphatase: 68 U/L (ref 38–126)
Anion gap: 14 (ref 5–15)
BUN: 24 mg/dL — ABNORMAL HIGH (ref 8–23)
CO2: 25 mmol/L (ref 22–32)
Calcium: 9.9 mg/dL (ref 8.9–10.3)
Chloride: 101 mmol/L (ref 98–111)
Creatinine, Ser: 1.6 mg/dL — ABNORMAL HIGH (ref 0.61–1.24)
GFR, Estimated: 42 mL/min — ABNORMAL LOW (ref >60)
Glucose, Bld: 227 mg/dL — ABNORMAL HIGH (ref 70–99)
Potassium: 4.1 mmol/L (ref 3.5–5.1)
Sodium: 141 mmol/L (ref 135–145)
Total Bilirubin: 0.7 mg/dL (ref 0.0–1.2)
Total Protein: 6.4 g/dL — ABNORMAL LOW (ref 6.5–8.1)

## 2024-09-09 LAB — URINALYSIS, ROUTINE W REFLEX MICROSCOPIC
Bacteria, UA: NONE SEEN
Bilirubin Urine: NEGATIVE
Glucose, UA: >1000 mg/dL — AB
Ketones, ur: NEGATIVE mg/dL
Nitrite: NEGATIVE
Protein, ur: 100 mg/dL — AB
Specific Gravity, Urine: 1.024 (ref 1.005–1.030)
WBC, UA: >50 WBC/hpf (ref 0–5)
pH: 6 (ref 5.0–8.0)

## 2024-09-09 LAB — CBC
HCT: 30.4 % — ABNORMAL LOW (ref 39.0–52.0)
Hemoglobin: 10 g/dL — ABNORMAL LOW (ref 13.0–17.0)
MCH: 32.5 pg (ref 26.0–34.0)
MCHC: 32.9 g/dL (ref 30.0–36.0)
MCV: 98.7 fL (ref 80.0–100.0)
Platelets: 95 K/uL — ABNORMAL LOW (ref 150–400)
RBC: 3.08 MIL/uL — ABNORMAL LOW (ref 4.22–5.81)
RDW: 15.7 % — ABNORMAL HIGH (ref 11.5–15.5)
WBC: 4.9 K/uL (ref 4.0–10.5)
nRBC: 0 % (ref 0.0–0.2)

## 2024-09-09 LAB — LIPASE, BLOOD: Lipase: 20 U/L (ref 11–51)

## 2024-09-09 MED ORDER — MORPHINE SULFATE (PF) 2 MG/ML IV SOLN
2.0000 mg | Freq: Once | INTRAVENOUS | Status: AC
  Administered 2024-09-09: 2 mg via INTRAVENOUS
  Filled 2024-09-09: qty 1

## 2024-09-09 MED ORDER — ALUM & MAG HYDROXIDE-SIMETH 200-200-20 MG/5ML PO SUSP
30.0000 mL | Freq: Once | ORAL | Status: AC
  Administered 2024-09-09: 30 mL via ORAL
  Filled 2024-09-09: qty 30

## 2024-09-09 MED ORDER — ONDANSETRON HCL 4 MG/2ML IJ SOLN
4.0000 mg | Freq: Once | INTRAMUSCULAR | Status: AC
  Administered 2024-09-09: 4 mg via INTRAVENOUS
  Filled 2024-09-09: qty 2

## 2024-09-09 MED ORDER — PANTOPRAZOLE SODIUM 40 MG PO TBEC: 40.0000 mg | DELAYED_RELEASE_TABLET | Freq: Every day | ORAL | 0 refills | Status: DC

## 2024-09-09 MED ORDER — SUCRALFATE 1 GM/10ML PO SUSP: 1.0000 g | Freq: Three times a day (TID) | ORAL | 0 refills | Status: AC

## 2024-09-09 MED ORDER — IOHEXOL 300 MG/ML  SOLN
100.0000 mL | Freq: Once | INTRAMUSCULAR | Status: AC | PRN
  Administered 2024-09-09: 70 mL via INTRAVENOUS

## 2024-09-09 MED ORDER — SODIUM CHLORIDE 0.9 % IV BOLUS
500.0000 mL | Freq: Once | INTRAVENOUS | Status: AC
  Administered 2024-09-09: 500 mL via INTRAVENOUS

## 2024-09-09 NOTE — Discharge Instructions (Signed)
 Your workup today was reassuring.  Please take the Protonix  daily.  You may take the Carafate  over the next 4 to 5 days before meals and before bedtime to see if this helps with your symptoms.  Please call and schedule a follow-up appointment with your GI doctor at [facility].  If you have any difficulty getting in touch with them or arranging follow-up you may call our GI doctor who is on-call for the [facility] system to see about scheduling a follow-up appointment.  Return to the ER for worsening symptoms.

## 2024-09-09 NOTE — ED Triage Notes (Signed)
 Pt via POV c/o lower abdominal pain with PO intake, radiating up towards epigastric area, x about 10 days. Pt denies vomiting but says he has poor appetite, fatigue, and insomnia for the past week and a half. LMB today, no fever or chills. Gallbladder removal. PMH includes HTN, DM2, heart murmur, multiple stents, CHF.

## 2024-09-09 NOTE — ED Notes (Signed)
  Patient requested a snack.  Dr Ula approved.  Patient given pop chips and diet coke per request.

## 2024-09-09 NOTE — ED Provider Notes (Signed)
 [place] EMERGENCY DEPARTMENT AT [facility] Provider Note   CSN: [id number] Arrival date & time: 09/09/24  1827     Patient presents with: Abdominal Pain   Keith Espinoza is a 83 y.o. male.   83 year old male with past medical history of diabetes, hypertension, hyperlipidemia, and gout presenting to the emergency department today with abdominal pain.  Patient states that over the past few days that he has been having abdominal pain mostly when he eats.  Reports the pain is diffuse in character.  States that it was initially in the upper part of his abdomen and now is in the lower part.  He has had some nausea but denies any vomiting.  Reports decreased oral intake.  Denies fever.  He reports that he has been having normal bowel movements.  Denies any urinary symptoms.  States that he has not really been eating or drinking much due to the pain.   Abdominal Pain      Prior to Admission medications   Medication Sig Start Date End Date Taking? Authorizing Provider  pantoprazole  (PROTONIX ) 40 MG tablet Take 1 tablet (40 mg total) by mouth daily. 09/09/24  Yes [name], MD  sucralfate  (CARAFATE ) 1 GM/10ML suspension Take 10 mLs (1 g total) by mouth 4 (four) times daily -  with meals and at bedtime. 09/09/24  Yes [name], MD  acetaminophen  (TYLENOL ) 500 MG tablet Take 2 tablets (1,000 mg total) by mouth every 6 (six) hours. Patient taking differently: Take 1,000 mg by mouth daily as needed for mild pain (pain score 1-3) or moderate pain (pain score 4-6). 10/12/23   [name], MD  allopurinol  (ZYLOPRIM ) 300 MG tablet Take 300 mg by mouth daily.    [provider]  Ascorbic Acid (VITAMIN C PO) Take 1 tablet by mouth daily.    [provider]  aspirin  EC 81 MG tablet Take 81 mg by mouth daily. Swallow whole.    [provider]  Cholecalciferol  (VITAMIN D3) 50 MCG (2000 UT) TABS Take 2,000 Units by mouth daily.    [provider]  clopidogrel  (PLAVIX ) 75 MG tablet Take 1 tablet (75 mg total) by mouth daily. 08/12/23   [name], PA-C  Continuous Glucose Sensor (DEXCOM G7 SENSOR) MISC 1 Device by Does not apply route as directed. 12/05/23   [name], [name], MD  Cyanocobalamin  (B-12) 1000 MCG TABS Take 1,000 mcg by mouth daily.    [provider]  empagliflozin  (JARDIANCE ) 25 MG TABS tablet Take 1 tablet (25 mg total) by mouth daily. 06/08/24   [name], [name], MD  furosemide  (LASIX ) 20 MG tablet Take 1 tablet (20 mg total) by mouth daily. 07/10/23   [name], MD  gabapentin  (NEURONTIN ) 400 MG capsule Take 1 capsule (400 mg total) by mouth daily. 07/10/23   [name], MD  insulin  aspart (NOVOLOG  FLEXPEN) 100 UNIT/ML FlexPen Max daily 30 units 06/08/24   [name], [name], MD  insulin  degludec (TRESIBA  FLEXTOUCH) 100 UNIT/ML FlexTouch Pen Inject 14 Units into the skin daily. 06/08/24   [name], [name], MD  insulin  lispro (HUMALOG  KWIKPEN) 100 UNIT/ML KwikPen Max daily 30 units 06/11/24   [name], [name], MD  Insulin  Pen Needle 32G X 4 MM MISC 1 Device by Does not apply route in the morning, at noon, in the evening, and at bedtime. 06/08/24   [name], [name], MD  KLOR-CON  M20 20 MEQ tablet Take 20 mEq by mouth daily.  [provider]  Magnesium  400 MG CAPS Take 400 mg by mouth daily.    [provider]  metFORMIN  (GLUCOPHAGE ) 1000 MG tablet Take 1 tablet (1,000 mg total) by mouth 2 (two) times daily. 06/08/24   [name], [name], MD  nitroGLYCERIN  (NITROSTAT ) 0.4 MG SL tablet Place 1 tablet (0.4 mg total) under the tongue every 5 (five) minutes x 3 doses as needed for chest pain. 04/16/24   [name], MD  Omega-3 Fatty Acids (FISH OIL) 500 MG CAPS Take 500 mg by mouth daily.    [provider]  rosuvastatin  (CRESTOR ) 20 MG tablet Take 1 tablet (20 mg total) by mouth daily. 07/27/24   [name], MD  senna-docusate  (SENOKOT-S) 8.6-50 MG tablet Take 1 tablet by mouth at bedtime. Patient taking differently: Take 1 tablet by mouth 2 (two) times daily. 02/03/21   [name], MD  terazosin  (HYTRIN ) 5 MG capsule Take 5 mg by mouth 2 (two) times daily.    [provider]  Turmeric (QC TUMERIC COMPLEX PO) Take 500 mg by mouth daily.    [provider]    Allergies: Simvastatin, Zetia [ezetimibe], and Dilaudid  [hydromorphone  hcl]    Review of Systems  Gastrointestinal:  Positive for abdominal pain.  All other systems reviewed and are negative.   Updated Vital Signs BP 111/71 (BP Location: Left Arm)   Pulse 84   Temp 98 F (36.7 C) (Oral)   Resp 18   Ht 5' 10 (1.778 m)   Wt 65.8 kg   SpO2 99%   BMI 20.81 kg/m   Physical Exam Vitals and nursing note reviewed.   Gen: NAD Eyes: PERRL, EOMI HEENT: no oropharyngeal swelling Neck: trachea midline Resp: Diminished at bilateral lung bases Card: RRR, no murmurs, rubs, or gallops Abd: Mild diffuse tenderness with no guarding or rebound Extremities: no calf tenderness, no edema Vascular: 2+ radial pulses bilaterally, 2+ DP pulses bilaterally Skin: no rashes Psyc: acting appropriately   (all labs ordered are listed, but only abnormal results are displayed) Labs Reviewed  COMPREHENSIVE METABOLIC PANEL WITH GFR - Abnormal; Notable for the following components:      Result Value   Glucose, Bld 227 (*)    BUN 24 (*)    Creatinine, Ser 1.60 (*)    Total Protein 6.4 (*)    GFR, Estimated 42 (*)    All other components within normal limits  CBC - Abnormal; Notable for the following components:   RBC 3.08 (*)    Hemoglobin 10.0 (*)    HCT 30.4 (*)    RDW 15.7 (*)    Platelets 95 (*)    All other components within normal limits  URINALYSIS, ROUTINE W REFLEX MICROSCOPIC - Abnormal; Notable for the following components:   APPearance HAZY (*)    Glucose, UA >1,000 (*)    Hgb urine dipstick SMALL (*)    Protein, ur 100 (*)     Leukocytes,Ua LARGE (*)    All other components within normal limits  LIPASE, BLOOD    EKG: None  Radiology: CT Chest Wo Contrast Result Date: 09/09/2024 EXAM: CT CHEST WITHOUT CONTRAST 09/09/2024 09:13:27 PM TECHNIQUE: CT of the chest was performed without the administration of intravenous contrast. Multiplanar reformatted images are provided for review. Automated exposure control, iterative reconstruction, and/or weight based adjustment of the mA/kV was utilized to reduce the radiation dose to as low as reasonably achievable. COMPARISON: 05/14/2024. CLINICAL HISTORY: Chest wall mass, US /xray nondiagnostic. FINDINGS: MEDIASTINUM: Diffuse  calcifications throughout the coronary arteries. Moderate aortic atherosclerosis. The central airways are clear. LYMPH NODES: No mediastinal, hilar or axillary lymphadenopathy. LUNGS AND PLEURA: Small bilateral pleural effusions. Dependent and bibasilar atelectasis. No focal consolidation or pulmonary edema. No pneumothorax. SOFT TISSUES/BONES: No chest wall mass visualized. No acute abnormality of the bones. UPPER ABDOMEN: Limited images of the upper abdomen demonstrates no acute abnormality. IMPRESSION: 1. No chest wall mass visualized. 2. Small bilateral pleural effusions with dependent and bibasilar atelectasis. 3. Diffuse coronary artery disease, aortic atherosclerosis Electronically signed by: Franky Crease MD 09/09/2024 09:22 PM EST RP Workstation: [id number]   CT ABDOMEN PELVIS W CONTRAST Result Date: 09/09/2024 EXAM: CT ABDOMEN AND PELVIS WITH CONTRAST 09/09/2024 09:13:01 PM TECHNIQUE: CT of the abdomen and pelvis was performed with the administration of 70 mL of iohexol  (OMNIPAQUE ) 300 MG/ML solution. Multiplanar reformatted images are provided for review. Automated exposure control, iterative reconstruction, and/or weight-based adjustment of the mA/kV was utilized to reduce the radiation dose to as low as reasonably achievable. COMPARISON: 05/14/2024 CLINICAL  HISTORY: Abdominal pain, acute, nonlocalized. FINDINGS: LOWER CHEST: Small bilateral pleural effusions. Diffuse calcifications throughout the visualized coronary arteries. LIVER: The liver is unremarkable. GALLBLADDER AND BILE DUCTS: Prior cholecystectomy. No biliary ductal dilatation. SPLEEN: No acute abnormality. PANCREAS: No acute abnormality. ADRENAL GLANDS: No acute abnormality. KIDNEYS, URETERS AND BLADDER: Bilateral nephrolithiasis. No ureteral stones or hydronephrosis. No perinephric or periureteral stranding. Urinary bladder is unremarkable. GI AND BOWEL: Stomach demonstrates no acute abnormality. There is no bowel obstruction. PERITONEUM AND RETROPERITONEUM: No ascites. No free air. VASCULATURE: Aortic atherosclerosis. Diffuse aortoiliac atherosclerosis. Aorta is normal in caliber. LYMPH NODES: No lymphadenopathy. REPRODUCTIVE ORGANS: Prostate enlargement with calcifications. BONES AND SOFT TISSUES: Right hip replacement. No acute osseous abnormality. No focal soft tissue abnormality. IMPRESSION: 1. No acute findings in the abdomen or pelvis. 2. Bilateral nephrolithiasis. No hydronephrosis. 3. Diffuse coronary artery disease, aortic atherosclerosis. Electronically signed by: Franky Crease MD 09/09/2024 09:19 PM EST RP Workstation: [id number]     Procedures   Medications Ordered in the ED  iohexol  (OMNIPAQUE ) 300 MG/ML solution 100 mL (70 mLs Intravenous Contrast Given 09/09/24 2113)  sodium chloride  0.9 % bolus 500 mL (500 mLs Intravenous New Bag/Given 09/09/24 2118)  morphine  (PF) 2 MG/ML injection 2 mg (2 mg Intravenous Given 09/09/24 2117)  ondansetron  (ZOFRAN ) injection 4 mg (4 mg Intravenous Given 09/09/24 2117)  alum & mag hydroxide-simeth (MAALOX/MYLANTA) 200-200-20 MG/5ML suspension 30 mL (30 mLs Oral Given 09/09/24 2117)                                    Medical Decision Making 83 year old male with past medical history of diabetes, hypertension, hyperlipidemia, and gout presenting to  the emergency department today with abdominal pain.  I will further evaluate the patient here with basic labs including LFTs and lipase to evaluate for hepatobiliary pathology or pancreatitis.  I will give the patient morphine  Zofran  for symptom as well as a small fluid bolus but will hold off on large bolus as the patient does have history of CHF.  I will give patient a GI cocktail here in the event that this is due to GERD or gastritis.  I will reevaluate for ultimate disposition.  The patient's labs were largely reassuring.  He did have an AKI.  I discussed this with the patient.  CT scan did not show any acute findings.  The patient does have some pleural  effusions but does have a history of CHF.  His pulse ox is 99% on reassessment.  The patient is tolerating p.o. on reassessment.  I did discuss that we could admit the patient for AKI and GI evaluation inpatient.  He is feeling better and would like to try to go home and follow-up with his outpatient gastroenterologist which I think is reasonable.  He is discharged with return precautions.  Amount and/or Complexity of Data Reviewed Labs: ordered. Radiology: ordered.  Risk OTC drugs. Prescription drug management.        Final diagnoses:  Abdominal pain, unspecified abdominal location    ED Discharge Orders          Ordered    pantoprazole  (PROTONIX ) 40 MG tablet  Daily        09/09/24 2305    sucralfate  (CARAFATE ) 1 GM/10ML suspension  3 times daily with meals & bedtime        09/09/24 2305               [name], MD 09/09/24 2311

## 2024-09-10 ENCOUNTER — Other Ambulatory Visit: Payer: Self-pay

## 2024-09-10 DIAGNOSIS — R109 Unspecified abdominal pain: Secondary | ICD-10-CM | POA: Diagnosis not present

## 2024-09-10 MED ORDER — DEXCOM G7 SENSOR MISC: 1.0000 | 3 refills | Status: AC

## 2024-09-13 DIAGNOSIS — H353211 Exudative age-related macular degeneration, right eye, with active choroidal neovascularization: Secondary | ICD-10-CM | POA: Diagnosis not present

## 2024-09-13 DIAGNOSIS — H353133 Nonexudative age-related macular degeneration, bilateral, advanced atrophic without subfoveal involvement: Secondary | ICD-10-CM | POA: Diagnosis not present

## 2024-09-13 DIAGNOSIS — H353221 Exudative age-related macular degeneration, left eye, with active choroidal neovascularization: Secondary | ICD-10-CM | POA: Diagnosis not present

## 2024-09-14 ENCOUNTER — Ambulatory Visit (HOSPITAL_COMMUNITY): Admission: RE | Admit: 2024-09-14 | Discharge: 2024-09-14 | Attending: Vascular Surgery

## 2024-09-14 ENCOUNTER — Ambulatory Visit

## 2024-09-14 VITALS — BP 108/71 | HR 99 | Temp 97.7°F | Wt 145.0 lb

## 2024-09-14 DIAGNOSIS — I70299 Other atherosclerosis of native arteries of extremities, unspecified extremity: Secondary | ICD-10-CM | POA: Diagnosis not present

## 2024-09-14 DIAGNOSIS — L97909 Non-pressure chronic ulcer of unspecified part of unspecified lower leg with unspecified severity: Secondary | ICD-10-CM | POA: Diagnosis not present

## 2024-09-14 DIAGNOSIS — N179 Acute kidney failure, unspecified: Secondary | ICD-10-CM | POA: Diagnosis not present

## 2024-09-14 DIAGNOSIS — I739 Peripheral vascular disease, unspecified: Secondary | ICD-10-CM

## 2024-09-14 NOTE — Progress Notes (Signed)
 Office Note     CC:  follow up Requesting Provider:  Charlott Dorn LABOR, MD  HPI: Keith Espinoza is a 83 y.o. (1941/04/10) male who presents for surveillance of PAD.  Surgical history significant for right below the knee amputation.  He has a prosthetic however he is nonambulatory since fracturing his left femur last year which resulted in him being nonweightbearing on the left leg for 4 months.  He is still able to stand and pivot.  He denies any rest pain or tissue loss of the left lower extremity.  Past medical history significant for diabetes mellitus, insulin -dependent.  He is on aspirin  and statin daily.  He is a former smoker.   Past Medical History:  Diagnosis Date   Allergic rhinitis    Anemia    Aortic stenosis    Arthritis    Basal cell carcinoma 11/01/2019    bcc left chest treatment TX cx3 52fu    CHF (congestive heart failure) (HCC)    Chronic leg pain    right   Chronic lower back pain    Coronary artery disease    a. Stenting to RCA 2004; staged DES to LAD and Cx 2004. DES to [facility] 2012. b. DES to mCx, PTCA to dCx 11/2011. c. Lateral wall MI 2013 s/p PTCA to distal Cx & DES to mid OM2 11/2011. d. Low risk nuc 04/2014, EF wnl.   COVID-19    Diabetes mellitus    Insulin  dependent   Diabetic neuropathy (HCC)    MILD   Diverticulosis    Dysrhythmia    Bertrum syndrome    Gout    right wrist; right foot; right elbow; have had it since 1970's   H/O hiatal hernia    Heart murmur    History of echocardiogram    aortic sclerosis per echo 12/09 EF 65%, otherwise normal   History of hemorrhoids    BLEEDING   History of kidney stones    h/o   Hypertension    Diagnosed 1995    Myocardial infarction [facility])    Pancreatic pseudocyst    a. s/p remote drainage 2006.   Thrombocytopenia    Seen on oldest labs in system from 2004   Vitamin B 12 deficiency    orally replaced    Past Surgical History:  Procedure Laterality Date   ABDOMINAL AORTOGRAM W/LOWER  EXTREMITY Bilateral 08/08/2020   Procedure: ABDOMINAL AORTOGRAM W/LOWER EXTREMITY;  Surgeon: Eliza Lonni RAMAN, MD;  Location: [facility] INVASIVE CV LAB;  Service: Cardiovascular;  Laterality: Bilateral;   AMPUTATION Right 12/12/2020   Procedure: RIGHT BELOW KNEE AMPUTATION;  Surgeon: Harden Jerona GAILS, MD;  Location: [facility] OR;  Service: Orthopedics;  Laterality: Right;   BACK SURGERY     total of 3 times S/P fall    CARPAL TUNNEL RELEASE Bilateral    CHOLECYSTECTOMY  1990's   CIRCUMCISION N/A 10/12/2023   Procedure: Dorsal slit;  Surgeon: [name], MD;  Location: [facility] ORS;  Service: Urology;  Laterality: N/A;   COLONOSCOPY     COLONOSCOPY Left 07/08/2023   Procedure: COLONOSCOPY;  Surgeon: San Sandor GAILS, DO;  Location: [facility] ENDOSCOPY;  Service: Gastroenterology;  Laterality: Left;   CORONARY ANGIOPLASTY  11/11/11   CORONARY ANGIOPLASTY WITH STENT PLACEMENT  09/30/2011   1 then; makes a total of 4   CORONARY ANGIOPLASTY WITH STENT PLACEMENT  11/11/11   1; makes a total of 5   ESOPHAGOGASTRODUODENOSCOPY Left 07/08/2023   Procedure: ESOPHAGOGASTRODUODENOSCOPY (EGD);  Surgeon:  Cirigliano, Sandor GAILS, DO;  Location: [facility] ENDOSCOPY;  Service: Gastroenterology;  Laterality: Left;   HEMOSTASIS CLIP PLACEMENT  07/08/2023   Procedure: HEMOSTASIS CLIP PLACEMENT;  Surgeon: San Sandor GAILS, DO;  Location: [facility] ENDOSCOPY;  Service: Gastroenterology;;   HOT HEMOSTASIS N/A 07/08/2023   Procedure: HOT HEMOSTASIS (ARGON PLASMA COAGULATION/BICAP);  Surgeon: San Sandor GAILS, DO;  Location: [facility] ENDOSCOPY;  Service: Gastroenterology;  Laterality: N/A;   INGUINAL HERNIA REPAIR  2003   right   JOINT REPLACEMENT Right 04/03/2002   hip replacment   KNEE ARTHROSCOPY  1990's   left   LEFT HEART CATH AND CORONARY ANGIOGRAPHY N/A 04/25/2023   Procedure: LEFT HEART CATH AND CORONARY ANGIOGRAPHY;  Surgeon: Anner Alm ORN, MD;  Location: [facility] INVASIVE CV LAB;  Service: Cardiovascular;  Laterality: N/A;   LEFT HEART  CATHETERIZATION WITH CORONARY ANGIOGRAM N/A 09/30/2011   Procedure: LEFT HEART CATHETERIZATION WITH CORONARY ANGIOGRAM;  Surgeon: Candyce GORMAN Reek, MD;  Location: [facility] CATH LAB;  Service: Cardiovascular;  Laterality: N/A;  possible PCI   LEFT HEART CATHETERIZATION WITH CORONARY ANGIOGRAM N/A 11/15/2011   Procedure: LEFT HEART CATHETERIZATION WITH CORONARY ANGIOGRAM;  Surgeon: Candyce GORMAN Reek, MD;  Location: [facility] CATH LAB;  Service: Cardiovascular;  Laterality: N/A;   PERCUTANEOUS CORONARY STENT INTERVENTION (PCI-S)  09/30/2011   Procedure: PERCUTANEOUS CORONARY STENT INTERVENTION (PCI-S);  Surgeon: Candyce GORMAN Reek, MD;  Location: [facility] CATH LAB;  Service: Cardiovascular;;   PERCUTANEOUS CORONARY STENT INTERVENTION (PCI-S) N/A 11/11/2011   Procedure: PERCUTANEOUS CORONARY STENT INTERVENTION (PCI-S);  Surgeon: Candyce GORMAN Reek, MD;  Location: [facility] CATH LAB;  Service: Cardiovascular;  Laterality: N/A;   PERIPHERAL VASCULAR BALLOON ANGIOPLASTY Right 08/08/2020   Procedure: PERIPHERAL VASCULAR BALLOON ANGIOPLASTY;  Surgeon: Eliza Lonni GORMAN, MD;  Location: [facility] INVASIVE CV LAB;  Service: Cardiovascular;  Laterality: Right;  Posterior tibial    SHOULDER SURGERY Right    X 2   STUMP REVISION Right 01/09/2021   Procedure: REVISION RIGHT BELOW KNEE AMPUTATION;  Surgeon: Harden Jerona GAILS, MD;  Location: [facility] OR;  Service: Orthopedics;  Laterality: Right;   TONSILLECTOMY  ~ 1948   TOTAL HIP REVISION Right 04/13/2019   Procedure: RIGHT TOTAL HIP REVISION-POSTERIOR  APPROACH LATERAL;  Surgeon: Barbarann Oneil BROCKS, MD;  Location: [facility] OR;  Service: Orthopedics;  Laterality: Right;   TOTAL KNEE ARTHROPLASTY Left 07/23/2016   Procedure: LEFT TOTAL KNEE ARTHROPLASTY;  Surgeon: Oneil BROCKS Barbarann, MD;  Location: [facility] OR;  Service: Orthopedics;  Laterality: Left;    Social History   Socioeconomic History   Marital status: Widowed    Spouse name: Not on file   Number of children: Not on file   Years of education: Not on file    Highest education level: Not on file  Occupational History   Occupation: Retired  Tobacco Use   Smoking status: Former    Types: Cigarettes   Smokeless tobacco: Current    Types: Chew   Tobacco comments:    quit 60 years ago  Vaping Use   Vaping status: Never Used  Substance and Sexual Activity   Alcohol use: No   Drug use: No   Sexual activity: Not Currently  Other Topics Concern   Not on file  Social History Narrative   Not on file   Social Drivers of Health   Tobacco Use: High Risk (09/14/2024)   Patient History    Smoking Tobacco Use: Former    Smokeless Tobacco Use: Current    Passive Exposure: Not on Hospital Doctor  Resource Strain: Low Risk (05/16/2023)   Received from [facility]   Overall Financial Resource Strain (CARDIA)    Difficulty of Paying Living Expenses: Not very hard  Food Insecurity: No Food Insecurity (05/14/2024)   Epic    Worried About Programme Researcher, Broadcasting/film/video in the Last Year: Never true    Ran Out of Food in the Last Year: Never true  Transportation Needs: No Transportation Needs (05/14/2024)   Epic    Lack of Transportation (Medical): No    Lack of Transportation (Non-Medical): No  Physical Activity: Not on file  Stress: No Stress Concern Present (06/12/2023)   Received from [facility] - Occupational Stress Questionnaire    Feeling of Stress : Not at all  Social Connections: Unknown (05/14/2024)   Social Connection and Isolation Panel    Frequency of Communication with Friends and Family: More than three times a week    Frequency of Social Gatherings with Friends and Family: More than three times a week    Attends Religious Services: Never    Database Administrator or Organizations: No    Attends Banker Meetings: Never    Marital Status: Not on file  Intimate Partner Violence: Not At Risk (05/14/2024)   Epic    Fear of Current or Ex-Partner: No    Emotionally Abused: No     Physically Abused: No    Sexually Abused: No  Depression (PHQ2-9): Low Risk (04/21/2022)   Depression (PHQ2-9)    PHQ-2 Score: 0  Alcohol Screen: Not on file  Housing: Unknown (05/14/2024)   Epic    Unable to Pay for Housing in the Last Year: No    Number of Times Moved in the Last Year: Not on file    Homeless in the Last Year: No  Utilities: Not At Risk (05/14/2024)   Epic    Threatened with loss of utilities: No  Health Literacy: Not on file    Family History  Problem Relation Age of Onset   Diabetes Mother    Hyperlipidemia Mother    Hypertension Mother    Cancer Father    Hypertension Father    Diabetes Sister    Hypertension Sister    Cancer Brother    Heart attack Neg Hx     Current Outpatient Medications  Medication Sig Dispense Refill   acetaminophen  (TYLENOL ) 500 MG tablet Take 2 tablets (1,000 mg total) by mouth every 6 (six) hours. (Patient taking differently: Take 1,000 mg by mouth daily as needed for mild pain (pain score 1-3) or moderate pain (pain score 4-6).) 50 tablet 0   allopurinol  (ZYLOPRIM ) 300 MG tablet Take 300 mg by mouth daily.     Ascorbic Acid (VITAMIN C PO) Take 1 tablet by mouth daily.     aspirin  EC 81 MG tablet Take 81 mg by mouth daily. Swallow whole.     Cholecalciferol  (VITAMIN D3) 50 MCG (2000 UT) TABS Take 2,000 Units by mouth daily.     clopidogrel  (PLAVIX ) 75 MG tablet Take 1 tablet (75 mg total) by mouth daily. 90 tablet 3   Continuous Glucose Sensor (DEXCOM G7 SENSOR) MISC 1 Device by Does not apply route as directed. 9 each 3   Cyanocobalamin  (B-12) 1000 MCG TABS Take 1,000 mcg by mouth daily.     empagliflozin  (JARDIANCE ) 25 MG TABS tablet Take 1 tablet (25 mg total) by mouth daily. 90 tablet 0   furosemide  (LASIX ) 20 MG  tablet Take 1 tablet (20 mg total) by mouth daily. 30 tablet 0   gabapentin  (NEURONTIN ) 400 MG capsule Take 1 capsule (400 mg total) by mouth daily. 30 capsule 0   insulin  aspart (NOVOLOG  FLEXPEN) 100 UNIT/ML FlexPen  Max daily 30 units 30 mL 3   insulin  degludec (TRESIBA  FLEXTOUCH) 100 UNIT/ML FlexTouch Pen Inject 14 Units into the skin daily. 15 mL 3   insulin  lispro (HUMALOG  KWIKPEN) 100 UNIT/ML KwikPen Max daily 30 units 30 mL 3   Insulin  Pen Needle 32G X 4 MM MISC 1 Device by Does not apply route in the morning, at noon, in the evening, and at bedtime. 400 each 3   KLOR-CON  M20 20 MEQ tablet Take 20 mEq by mouth daily.     Magnesium  400 MG CAPS Take 400 mg by mouth daily.     metFORMIN  (GLUCOPHAGE ) 1000 MG tablet Take 1 tablet (1,000 mg total) by mouth 2 (two) times daily.     nitroGLYCERIN  (NITROSTAT ) 0.4 MG SL tablet Place 1 tablet (0.4 mg total) under the tongue every 5 (five) minutes x 3 doses as needed for chest pain. 25 tablet 6   Omega-3 Fatty Acids (FISH OIL) 500 MG CAPS Take 500 mg by mouth daily.     pantoprazole  (PROTONIX ) 40 MG tablet Take 1 tablet (40 mg total) by mouth daily. 30 tablet 0   rosuvastatin  (CRESTOR ) 20 MG tablet Take 1 tablet (20 mg total) by mouth daily. 90 tablet 2   senna-docusate (SENOKOT-S) 8.6-50 MG tablet Take 1 tablet by mouth at bedtime. (Patient taking differently: Take 1 tablet by mouth 2 (two) times daily.) 30 tablet 2   sucralfate  (CARAFATE ) 1 GM/10ML suspension Take 10 mLs (1 g total) by mouth 4 (four) times daily -  with meals and at bedtime. 420 mL 0   terazosin  (HYTRIN ) 5 MG capsule Take 5 mg by mouth 2 (two) times daily.     Turmeric (QC TUMERIC COMPLEX PO) Take 500 mg by mouth daily.     No current facility-administered medications for this visit.    Allergies[1]   REVIEW OF SYSTEMS:  Negative unless noted in HPI [X]  denotes positive finding, [ ]  denotes negative finding Cardiac  Comments:  Chest pain or chest pressure:    Shortness of breath upon exertion:    Short of breath when lying flat:    Irregular heart rhythm:        Vascular    Pain in calf, thigh, or hip brought on by ambulation:    Pain in feet at night that wakes you up from your  sleep:     Blood clot in your veins:    Leg swelling:         Pulmonary    Oxygen at home:    Productive cough:     Wheezing:         Neurologic    Sudden weakness in arms or legs:     Sudden numbness in arms or legs:     Sudden onset of difficulty speaking or slurred speech:    Temporary loss of vision in one eye:     Problems with dizziness:         Gastrointestinal    Blood in stool:     Vomited blood:         Genitourinary    Burning when urinating:     Blood in urine:        Psychiatric    Major depression:  Hematologic    Bleeding problems:    Problems with blood clotting too easily:        Skin    Rashes or ulcers:        Constitutional    Fever or chills:      PHYSICAL EXAMINATION:  Vitals:   09/14/24 1312  BP: 108/71  Pulse: 99  Temp: 97.7 F (36.5 C)  TempSrc: Temporal  Weight: 145 lb (65.8 kg)    General:  WDWN in NAD; vital signs documented above Gait: Not observed HENT: WNL, normocephalic Pulmonary: normal non-labored breathing Cardiac: regular HR Abdomen: soft, NT, no masses Skin: without rashes Vascular Exam/Pulses: absent L pedal pulses Extremities: without ischemic changes, without Gangrene , without cellulitis; without open wounds;  Musculoskeletal: no muscle wasting or atrophy  Neurologic: A&O X 3 Psychiatric:  The pt has Normal affect.   Non-Invasive Vascular Imaging:    Left     Lt Pressure (mmHg)IndexWaveformComment  +---------+------------------+-----+--------+-------+  Brachial 113                                     +---------+------------------+-----+--------+-------+  PTA     107               0.88                  +---------+------------------+-----+--------+-------+  DP      84                0.69                  +---------+------------------+-----+--------+-------+  Great Toe72                0.60                  +---------+------------------+-----+--------+-------+          ASSESSMENT/PLAN:: 83 y.o. male here for follow up for surveillance of PAD  Mr. Wisham is doing well since last office visit.  He is able to stand and pivot with his prosthetic limb.  He does not have any rest pain or tissue loss of the left foot.  Left ABI and TBI are unchanged.  He will continue his aspirin  and statin daily.  We will repeat ABI/TBI on an annual basis.  He will notify the office if he develops any rest pain or tissue loss in the meantime.   Donnice Sender, PA-C [facility] [phone]  Clinic MD:   [place]     [1]  Allergies Allergen Reactions   Simvastatin Other (See Comments)    SEVERE MYALGIAS    Zetia [Ezetimibe] Other (See Comments)    MYALGIAS   Dilaudid  [Hydromorphone  Hcl] Other (See Comments)    hallucination

## 2024-09-15 ENCOUNTER — Other Ambulatory Visit: Payer: Self-pay

## 2024-09-15 ENCOUNTER — Emergency Department (HOSPITAL_BASED_OUTPATIENT_CLINIC_OR_DEPARTMENT_OTHER)

## 2024-09-15 ENCOUNTER — Inpatient Hospital Stay (HOSPITAL_BASED_OUTPATIENT_CLINIC_OR_DEPARTMENT_OTHER)
Admission: EM | Admit: 2024-09-15 | Discharge: 2024-09-19 | DRG: 871 | Disposition: A | Attending: Internal Medicine | Admitting: Internal Medicine

## 2024-09-15 ENCOUNTER — Encounter (HOSPITAL_BASED_OUTPATIENT_CLINIC_OR_DEPARTMENT_OTHER): Payer: Self-pay | Admitting: Emergency Medicine

## 2024-09-15 DIAGNOSIS — Z96641 Presence of right artificial hip joint: Secondary | ICD-10-CM | POA: Diagnosis present

## 2024-09-15 DIAGNOSIS — I5089 Other heart failure: Secondary | ICD-10-CM

## 2024-09-15 DIAGNOSIS — Z79899 Other long term (current) drug therapy: Secondary | ICD-10-CM

## 2024-09-15 DIAGNOSIS — Z833 Family history of diabetes mellitus: Secondary | ICD-10-CM

## 2024-09-15 DIAGNOSIS — Z87442 Personal history of urinary calculi: Secondary | ICD-10-CM

## 2024-09-15 DIAGNOSIS — N200 Calculus of kidney: Secondary | ICD-10-CM | POA: Diagnosis present

## 2024-09-15 DIAGNOSIS — Z888 Allergy status to other drugs, medicaments and biological substances status: Secondary | ICD-10-CM

## 2024-09-15 DIAGNOSIS — I130 Hypertensive heart and chronic kidney disease with heart failure and stage 1 through stage 4 chronic kidney disease, or unspecified chronic kidney disease: Secondary | ICD-10-CM | POA: Diagnosis present

## 2024-09-15 DIAGNOSIS — E861 Hypovolemia: Secondary | ICD-10-CM | POA: Diagnosis not present

## 2024-09-15 DIAGNOSIS — Z83438 Family history of other disorder of lipoprotein metabolism and other lipidemia: Secondary | ICD-10-CM

## 2024-09-15 DIAGNOSIS — N179 Acute kidney failure, unspecified: Secondary | ICD-10-CM

## 2024-09-15 DIAGNOSIS — G629 Polyneuropathy, unspecified: Secondary | ICD-10-CM

## 2024-09-15 DIAGNOSIS — I700 Atherosclerosis of aorta: Secondary | ICD-10-CM | POA: Diagnosis not present

## 2024-09-15 DIAGNOSIS — Z9049 Acquired absence of other specified parts of digestive tract: Secondary | ICD-10-CM

## 2024-09-15 DIAGNOSIS — Z955 Presence of coronary angioplasty implant and graft: Secondary | ICD-10-CM

## 2024-09-15 DIAGNOSIS — I480 Paroxysmal atrial fibrillation: Secondary | ICD-10-CM | POA: Diagnosis present

## 2024-09-15 DIAGNOSIS — Z7984 Long term (current) use of oral hypoglycemic drugs: Secondary | ICD-10-CM

## 2024-09-15 DIAGNOSIS — R918 Other nonspecific abnormal finding of lung field: Secondary | ICD-10-CM | POA: Diagnosis not present

## 2024-09-15 DIAGNOSIS — I959 Hypotension, unspecified: Secondary | ICD-10-CM | POA: Diagnosis present

## 2024-09-15 DIAGNOSIS — Z85828 Personal history of other malignant neoplasm of skin: Secondary | ICD-10-CM

## 2024-09-15 DIAGNOSIS — Z720 Tobacco use: Secondary | ICD-10-CM | POA: Diagnosis present

## 2024-09-15 DIAGNOSIS — Z96652 Presence of left artificial knee joint: Secondary | ICD-10-CM | POA: Diagnosis present

## 2024-09-15 DIAGNOSIS — I252 Old myocardial infarction: Secondary | ICD-10-CM

## 2024-09-15 DIAGNOSIS — R0989 Other specified symptoms and signs involving the circulatory and respiratory systems: Secondary | ICD-10-CM | POA: Diagnosis not present

## 2024-09-15 DIAGNOSIS — I10 Essential (primary) hypertension: Secondary | ICD-10-CM | POA: Diagnosis present

## 2024-09-15 DIAGNOSIS — Z794 Long term (current) use of insulin: Secondary | ICD-10-CM

## 2024-09-15 DIAGNOSIS — E785 Hyperlipidemia, unspecified: Secondary | ICD-10-CM | POA: Diagnosis present

## 2024-09-15 DIAGNOSIS — Z8616 Personal history of COVID-19: Secondary | ICD-10-CM

## 2024-09-15 DIAGNOSIS — R531 Weakness: Secondary | ICD-10-CM

## 2024-09-15 DIAGNOSIS — J189 Pneumonia, unspecified organism: Secondary | ICD-10-CM | POA: Diagnosis present

## 2024-09-15 DIAGNOSIS — N400 Enlarged prostate without lower urinary tract symptoms: Secondary | ICD-10-CM | POA: Diagnosis not present

## 2024-09-15 DIAGNOSIS — A419 Sepsis, unspecified organism: Principal | ICD-10-CM | POA: Diagnosis present

## 2024-09-15 DIAGNOSIS — Z89511 Acquired absence of right leg below knee: Secondary | ICD-10-CM

## 2024-09-15 DIAGNOSIS — R109 Unspecified abdominal pain: Secondary | ICD-10-CM | POA: Diagnosis present

## 2024-09-15 DIAGNOSIS — E872 Acidosis, unspecified: Secondary | ICD-10-CM | POA: Diagnosis not present

## 2024-09-15 DIAGNOSIS — D696 Thrombocytopenia, unspecified: Secondary | ICD-10-CM | POA: Diagnosis present

## 2024-09-15 DIAGNOSIS — F32A Depression, unspecified: Secondary | ICD-10-CM | POA: Diagnosis present

## 2024-09-15 DIAGNOSIS — E1122 Type 2 diabetes mellitus with diabetic chronic kidney disease: Secondary | ICD-10-CM | POA: Diagnosis present

## 2024-09-15 DIAGNOSIS — N1831 Chronic kidney disease, stage 3a: Secondary | ICD-10-CM

## 2024-09-15 DIAGNOSIS — Z885 Allergy status to narcotic agent status: Secondary | ICD-10-CM

## 2024-09-15 DIAGNOSIS — I5022 Chronic systolic (congestive) heart failure: Secondary | ICD-10-CM | POA: Diagnosis present

## 2024-09-15 DIAGNOSIS — I2510 Atherosclerotic heart disease of native coronary artery without angina pectoris: Secondary | ICD-10-CM | POA: Diagnosis present

## 2024-09-15 DIAGNOSIS — E1165 Type 2 diabetes mellitus with hyperglycemia: Secondary | ICD-10-CM

## 2024-09-15 DIAGNOSIS — Z7982 Long term (current) use of aspirin: Secondary | ICD-10-CM

## 2024-09-15 DIAGNOSIS — Z8249 Family history of ischemic heart disease and other diseases of the circulatory system: Secondary | ICD-10-CM

## 2024-09-15 DIAGNOSIS — J206 Acute bronchitis due to rhinovirus: Secondary | ICD-10-CM | POA: Diagnosis present

## 2024-09-15 DIAGNOSIS — E1140 Type 2 diabetes mellitus with diabetic neuropathy, unspecified: Secondary | ICD-10-CM | POA: Diagnosis present

## 2024-09-15 DIAGNOSIS — Z1152 Encounter for screening for COVID-19: Secondary | ICD-10-CM

## 2024-09-15 DIAGNOSIS — Z7902 Long term (current) use of antithrombotics/antiplatelets: Secondary | ICD-10-CM

## 2024-09-15 DIAGNOSIS — R627 Adult failure to thrive: Secondary | ICD-10-CM | POA: Diagnosis present

## 2024-09-15 DIAGNOSIS — F17220 Nicotine dependence, chewing tobacco, uncomplicated: Secondary | ICD-10-CM | POA: Diagnosis present

## 2024-09-15 DIAGNOSIS — R6520 Severe sepsis without septic shock: Secondary | ICD-10-CM | POA: Diagnosis present

## 2024-09-15 DIAGNOSIS — J90 Pleural effusion, not elsewhere classified: Secondary | ICD-10-CM | POA: Diagnosis not present

## 2024-09-15 DIAGNOSIS — Z6821 Body mass index (BMI) 21.0-21.9, adult: Secondary | ICD-10-CM

## 2024-09-15 LAB — PROCALCITONIN: Procalcitonin: <0.1 ng/mL

## 2024-09-15 LAB — COMPREHENSIVE METABOLIC PANEL WITH GFR
ALT: 43 U/L (ref 0–44)
AST: 92 U/L — ABNORMAL HIGH (ref 15–41)
Albumin: 3.7 g/dL (ref 3.5–5.0)
Alkaline Phosphatase: 78 U/L (ref 38–126)
Anion gap: 13 (ref 5–15)
BUN: 27 mg/dL — ABNORMAL HIGH (ref 8–23)
CO2: 26 mmol/L (ref 22–32)
Calcium: 9.9 mg/dL (ref 8.9–10.3)
Chloride: 101 mmol/L (ref 98–111)
Creatinine, Ser: 2.05 mg/dL — ABNORMAL HIGH (ref 0.61–1.24)
GFR, Estimated: 31 mL/min — ABNORMAL LOW (ref >60)
Glucose, Bld: 208 mg/dL — ABNORMAL HIGH (ref 70–99)
Potassium: 4.4 mmol/L (ref 3.5–5.1)
Sodium: 139 mmol/L (ref 135–145)
Total Bilirubin: 0.7 mg/dL (ref 0.0–1.2)
Total Protein: 6.3 g/dL — ABNORMAL LOW (ref 6.5–8.1)

## 2024-09-15 LAB — CBC WITH DIFFERENTIAL/PLATELET
Abs Immature Granulocytes: 0.02 K/uL (ref 0.00–0.07)
Basophils Absolute: 0 K/uL (ref 0.0–0.1)
Basophils Relative: 0 %
Eosinophils Absolute: 0.4 K/uL (ref 0.0–0.5)
Eosinophils Relative: 6 %
HCT: 30.6 % — ABNORMAL LOW (ref 39.0–52.0)
Hemoglobin: 9.8 g/dL — ABNORMAL LOW (ref 13.0–17.0)
Immature Granulocytes: 0 %
Lymphocytes Relative: 14 %
Lymphs Abs: 0.8 K/uL (ref 0.7–4.0)
MCH: 31.8 pg (ref 26.0–34.0)
MCHC: 32 g/dL (ref 30.0–36.0)
MCV: 99.4 fL (ref 80.0–100.0)
Monocytes Absolute: 0.4 K/uL (ref 0.1–1.0)
Monocytes Relative: 7 %
Neutro Abs: 4.4 K/uL (ref 1.7–7.7)
Neutrophils Relative %: 73 %
Platelets: 83 K/uL — ABNORMAL LOW (ref 150–400)
RBC: 3.08 MIL/uL — ABNORMAL LOW (ref 4.22–5.81)
RDW: 15.8 % — ABNORMAL HIGH (ref 11.5–15.5)
WBC: 6 K/uL (ref 4.0–10.5)
nRBC: 0 % (ref 0.0–0.2)

## 2024-09-15 LAB — LACTIC ACID, PLASMA
Lactic Acid, Venous: 3.8 mmol/L (ref 0.5–1.9)
Lactic Acid, Venous: 2.5 mmol/L (ref 0.5–1.9)
Lactic Acid, Venous: 2.7 mmol/L (ref 0.5–1.9)

## 2024-09-15 LAB — RESP PANEL BY RT-PCR (RSV, FLU A&B, COVID)  RVPGX2
Influenza A by PCR: NEGATIVE
Influenza B by PCR: NEGATIVE
Resp Syncytial Virus by PCR: NEGATIVE
SARS Coronavirus 2 by RT PCR: NEGATIVE

## 2024-09-15 LAB — GLUCOSE, CAPILLARY
Glucose-Capillary: 188 mg/dL — ABNORMAL HIGH (ref 70–99)
Glucose-Capillary: 222 mg/dL — ABNORMAL HIGH (ref 70–99)

## 2024-09-15 MED ORDER — SODIUM CHLORIDE 0.9 % IV BOLUS
1000.0000 mL | Freq: Once | INTRAVENOUS | Status: AC
  Administered 2024-09-15: 1000 mL via INTRAVENOUS

## 2024-09-15 MED ORDER — ROSUVASTATIN CALCIUM 20 MG PO TABS: 20.0000 mg | ORAL_TABLET | Freq: Every day | ORAL | Status: DC

## 2024-09-15 MED ORDER — INSULIN ASPART 100 UNIT/ML IJ SOLN
0.0000 [IU] | Freq: Three times a day (TID) | INTRAMUSCULAR | Status: DC
  Administered 2024-09-16 (×2): 3 [IU] via SUBCUTANEOUS
  Administered 2024-09-16: 2 [IU] via SUBCUTANEOUS
  Administered 2024-09-17: 14:00:00 3 [IU] via SUBCUTANEOUS
  Administered 2024-09-17: 09:00:00 2 [IU] via SUBCUTANEOUS
  Administered 2024-09-17: 19:00:00 1 [IU] via SUBCUTANEOUS
  Administered 2024-09-18 (×2): 2 [IU] via SUBCUTANEOUS
  Administered 2024-09-19 (×2): 3 [IU] via SUBCUTANEOUS
  Filled 2024-09-15: qty 2
  Filled 2024-09-15: qty 3
  Filled 2024-09-15 (×2): qty 2
  Filled 2024-09-15 (×3): qty 3
  Filled 2024-09-15: qty 2
  Filled 2024-09-15: qty 3

## 2024-09-15 MED ORDER — ASPIRIN 81 MG PO TBEC
81.0000 mg | DELAYED_RELEASE_TABLET | Freq: Every day | ORAL | Status: DC
  Filled 2024-09-15: qty 1

## 2024-09-15 MED ORDER — SODIUM CHLORIDE 0.9 % IV SOLN
2.0000 g | INTRAVENOUS | Status: AC
  Administered 2024-09-16 – 2024-09-19 (×4): 2 g via INTRAVENOUS
  Filled 2024-09-15 (×4): qty 20

## 2024-09-15 MED ORDER — ONDANSETRON HCL 4 MG/2ML IJ SOLN
4.0000 mg | Freq: Once | INTRAMUSCULAR | Status: AC
  Administered 2024-09-15: 4 mg via INTRAVENOUS
  Filled 2024-09-15: qty 2

## 2024-09-15 MED ORDER — TERAZOSIN HCL 5 MG PO CAPS
5.0000 mg | ORAL_CAPSULE | Freq: Two times a day (BID) | ORAL | Status: DC
  Administered 2024-09-15 – 2024-09-19 (×7): 5 mg via ORAL
  Filled 2024-09-15 (×8): qty 1

## 2024-09-15 MED ORDER — ENOXAPARIN SODIUM 30 MG/0.3ML IJ SOSY: 30.0000 mg | PREFILLED_SYRINGE | INTRAMUSCULAR | Status: DC

## 2024-09-15 MED ORDER — ACETAMINOPHEN 650 MG RE SUPP: 650.0000 mg | Freq: Four times a day (QID) | RECTAL | Status: DC | PRN

## 2024-09-15 MED ORDER — SODIUM CHLORIDE 0.9 % IV SOLN
500.0000 mg | Freq: Once | INTRAVENOUS | Status: AC
  Administered 2024-09-15: 500 mg via INTRAVENOUS
  Filled 2024-09-15: qty 5

## 2024-09-15 MED ORDER — ACETAMINOPHEN 325 MG PO TABS: 650.0000 mg | ORAL_TABLET | Freq: Four times a day (QID) | ORAL | Status: DC | PRN

## 2024-09-15 MED ORDER — SODIUM CHLORIDE 0.9 % IV SOLN
2.0000 g | Freq: Once | INTRAVENOUS | Status: AC
  Administered 2024-09-15: 2 g via INTRAVENOUS
  Filled 2024-09-15: qty 20

## 2024-09-15 MED ORDER — IPRATROPIUM-ALBUTEROL 0.5-2.5 (3) MG/3ML IN SOLN: 3.0000 mL | Freq: Four times a day (QID) | RESPIRATORY_TRACT | Status: DC | PRN

## 2024-09-15 MED ORDER — CLOPIDOGREL BISULFATE 75 MG PO TABS: 75.0000 mg | ORAL_TABLET | Freq: Every day | ORAL | Status: DC

## 2024-09-15 MED ORDER — PANTOPRAZOLE SODIUM 40 MG PO TBEC
40.0000 mg | DELAYED_RELEASE_TABLET | Freq: Every day | ORAL | Status: DC
  Administered 2024-09-15 – 2024-09-16 (×2): 40 mg via ORAL
  Filled 2024-09-15 (×2): qty 1

## 2024-09-15 MED ORDER — POLYETHYLENE GLYCOL 3350 17 G PO PACK
17.0000 g | PACK | Freq: Every day | ORAL | Status: DC | PRN
  Filled 2024-09-15: qty 1

## 2024-09-15 MED ORDER — SUCRALFATE 1 GM/10ML PO SUSP
1.0000 g | Freq: Three times a day (TID) | ORAL | Status: DC
  Administered 2024-09-15 – 2024-09-17 (×6): 1 g via ORAL
  Filled 2024-09-15 (×6): qty 10

## 2024-09-15 MED ORDER — ACETAMINOPHEN 500 MG PO TABS
1000.0000 mg | ORAL_TABLET | Freq: Once | ORAL | Status: AC
  Administered 2024-09-15: 1000 mg via ORAL
  Filled 2024-09-15: qty 2

## 2024-09-15 MED ORDER — SODIUM CHLORIDE 0.9 % IV SOLN
500.0000 mg | INTRAVENOUS | Status: DC
  Administered 2024-09-16: 500 mg via INTRAVENOUS
  Filled 2024-09-15 (×2): qty 5

## 2024-09-15 MED ORDER — GUAIFENESIN ER 600 MG PO TB12
600.0000 mg | ORAL_TABLET | Freq: Two times a day (BID) | ORAL | Status: DC
  Administered 2024-09-15 – 2024-09-19 (×8): 600 mg via ORAL
  Filled 2024-09-15 (×8): qty 1

## 2024-09-15 MED ORDER — ROSUVASTATIN CALCIUM 10 MG PO TABS
10.0000 mg | ORAL_TABLET | Freq: Every day | ORAL | Status: DC
  Administered 2024-09-15 – 2024-09-19 (×5): 10 mg via ORAL
  Filled 2024-09-15 (×5): qty 1

## 2024-09-15 NOTE — ED Notes (Signed)
 I have just called report to Rosina Peak at [facility]. [facility] left with him ~ 10 min. Ago.

## 2024-09-15 NOTE — Assessment & Plan Note (Addendum)
 RLL consolidation on CXR today, pt presenting with cough, chills.  Not hypoxic on admission.  Pt does report sometimes coughing with swallowing liquids, not solid foods. --IV antibiotics: Rocephin , Zithromax  --Scheduled Mucinex  --Sputum culture --O2 as needed, per protocol --Flutter valve --PRN Duonebs --SLP for swallow evaluation

## 2024-09-15 NOTE — Assessment & Plan Note (Signed)
 BP's in the ED soft, initial 98/69 >> 109/79 >> laster 86/70.  EDP gave a 2nd liter bolus IVF for BP which improved to 118/80. --Maintain MAP > 65 --Hold further IVF for now and monitor closely.  Pt has EF 40-45%.  --Caution with fluids

## 2024-09-15 NOTE — Progress Notes (Signed)
 Plan of Care Note for virtual admission   Patient: Keith Espinoza MRN: [id number]   DOA: 09/15/2024  Admitted from: [facility] Admitting provider: Fausto Burnard LABOR, DO   Chief complaint: Cough  Course: Patient presented to [facility] with cough congestion and chills.  Worse overnight last night leading to trouble sleeping.  Also recent decreased p.o. intake.  Workup so far notable for possible pneumonia of the right lower lobe on chest x-ray, creatinine elevation consistent with AKI on labs.  Elevated lactic acid and low normal blood pressures with at least 1 hypotensive episode.  Patient has received ceftriaxone , azithromycin , 2 L IV fluid, home medications.  Lactic acid initially improved 3.8-2.5.  Repeat flat at 2.7.  Further lactic acid has already been ordered.  Abdominal pain has improved lowering suspicion for mesenteric ischemia component, however does have significant atherosclerosis of his aorta in the area of his SMA on imaging.  If severe abdominal pain combined with persistent/increased lactic acid becomes an issue would consider this.  For suspected pneumonia on chest x-ray patient has been started on IV antibiotics.  Respiratory panel for flu COVID RSV were negative.  Will add full respiratory viral panel and procalcitonin considering there is no leukocytosis.   Blood pressures improving, in the 100s systolic on arrival here.  Will hold off on further fluids as per H&P considering history of CHF.  Plan of care: The patient will remain monitored on telemetry.  Will continue trending lactic acid.  Will add full respiratory viral panel, procalcitonin.  Author: Marsa KATHEE Scurry, MD 09/15/2024

## 2024-09-15 NOTE — ED Triage Notes (Signed)
 C/o cough, congestion, and chills since last night. Denies SHOB or CP.

## 2024-09-15 NOTE — ED Provider Notes (Signed)
 [place] EMERGENCY DEPARTMENT AT [facility] Provider Note   CSN: [id number] Arrival date & time: 09/15/24  1211     Patient presents with: Cough   Keith Espinoza is a 83 y.o. male.   83 yo M with a chief complaints of cough.  Going on for a day or 2.  Much worse last night had trouble laying back to go to sleep.  Not eating and drinking as much per family.  Denies any difficulty breathing.  Denies abdominal pain.  No known sick contacts.  No recent travel   Cough      Prior to Admission medications  Medication Sig Start Date End Date Taking? Authorizing Provider  acetaminophen  (TYLENOL ) 500 MG tablet Take 2 tablets (1,000 mg total) by mouth every 6 (six) hours. Patient taking differently: Take 1,000 mg by mouth daily as needed for mild pain (pain score 1-3) or moderate pain (pain score 4-6). 10/12/23   [name], MD  allopurinol  (ZYLOPRIM ) 300 MG tablet Take 300 mg by mouth daily.    [provider]  Ascorbic Acid (VITAMIN C PO) Take 1 tablet by mouth daily.    [provider]  aspirin  EC 81 MG tablet Take 81 mg by mouth daily. Swallow whole.    [provider]  Cholecalciferol  (VITAMIN D3) 50 MCG (2000 UT) TABS Take 2,000 Units by mouth daily.    [provider]  clopidogrel  (PLAVIX ) 75 MG tablet Take 1 tablet (75 mg total) by mouth daily. 08/12/23   [name], PA-C  Continuous Glucose Sensor (DEXCOM G7 SENSOR) MISC 1 Device by Does not apply route as directed. 09/10/24   [name], [name], MD  Cyanocobalamin  (B-12) 1000 MCG TABS Take 1,000 mcg by mouth daily.    [provider]  empagliflozin  (JARDIANCE ) 25 MG TABS tablet Take 1 tablet (25 mg total) by mouth daily. 06/08/24   [name], [name], MD  furosemide  (LASIX ) 20 MG tablet Take 1 tablet (20 mg total) by mouth daily. 07/10/23   [name], MD  gabapentin  (NEURONTIN ) 400 MG capsule Take 1 capsule (400 mg total) by mouth daily.  07/10/23   [name], MD  insulin  aspart (NOVOLOG  FLEXPEN) 100 UNIT/ML FlexPen Max daily 30 units 06/08/24   [name], [name], MD  insulin  degludec (TRESIBA  FLEXTOUCH) 100 UNIT/ML FlexTouch Pen Inject 14 Units into the skin daily. 06/08/24   [name], [name], MD  insulin  lispro (HUMALOG  KWIKPEN) 100 UNIT/ML KwikPen Max daily 30 units 06/11/24   [name], [name], MD  Insulin  Pen Needle 32G X 4 MM MISC 1 Device by Does not apply route in the morning, at noon, in the evening, and at bedtime. 06/08/24   [name], [name], MD  KLOR-CON  M20 20 MEQ tablet Take 20 mEq by mouth daily.    [provider]  Magnesium  400 MG CAPS Take 400 mg by mouth daily.    [provider]  metFORMIN  (GLUCOPHAGE ) 1000 MG tablet Take 1 tablet (1,000 mg total) by mouth 2 (two) times daily. 06/08/24   [name], [name], MD  nitroGLYCERIN  (NITROSTAT ) 0.4 MG SL tablet Place 1 tablet (0.4 mg total) under the tongue every 5 (five) minutes x 3 doses as needed for chest pain. 04/16/24   [name], MD  Omega-3 Fatty Acids (FISH OIL) 500 MG CAPS Take 500 mg by mouth daily.    [provider]  pantoprazole  (PROTONIX ) 40 MG tablet Take 1 tablet (40 mg total) by mouth daily. 09/09/24  [name], MD  rosuvastatin  (CRESTOR ) 20 MG tablet Take 1 tablet (20 mg total) by mouth daily. 07/27/24   [name], MD  senna-docusate (SENOKOT-S) 8.6-50 MG tablet Take 1 tablet by mouth at bedtime. Patient taking differently: Take 1 tablet by mouth 2 (two) times daily. 02/03/21   [name], MD  sucralfate  (CARAFATE ) 1 GM/10ML suspension Take 10 mLs (1 g total) by mouth 4 (four) times daily -  with meals and at bedtime. 09/09/24   [name], MD  terazosin  (HYTRIN ) 5 MG capsule Take 5 mg by mouth 2 (two) times daily.    [provider]  Turmeric (QC TUMERIC COMPLEX PO) Take 500 mg by mouth daily.    [provider]    Allergies: Simvastatin, Zetia  [ezetimibe], and Dilaudid  [hydromorphone  hcl]    Review of Systems  Respiratory:  Positive for cough.     Updated Vital Signs BP 103/75   Pulse 80   Temp 98.4 F (36.9 C) (Oral)   Resp 18   SpO2 94%   Physical Exam Vitals and nursing note reviewed.  Constitutional:      Appearance: He is well-developed.  HENT:     Head: Normocephalic and atraumatic.     Comments: Swollen turbinates, posterior nasal drip,  tm normal bilaterally.   Eyes:     Pupils: Pupils are equal, round, and reactive to light.  Neck:     Vascular: No JVD.  Cardiovascular:     Rate and Rhythm: Normal rate and regular rhythm.     Heart sounds: No murmur heard.    No friction rub. No gallop.  Pulmonary:     Effort: No respiratory distress.     Breath sounds: Rhonchi present. No wheezing.     Comments: Rhonchi best heard right lower lobe Abdominal:     General: There is no distension.     Tenderness: There is no abdominal tenderness. There is no guarding or rebound.  Musculoskeletal:        General: Normal range of motion.     Cervical back: Normal range of motion and neck supple.  Skin:    Coloration: Skin is not pale.     Findings: No rash.  Neurological:     Mental Status: He is alert and oriented to person, place, and time.  Psychiatric:        Behavior: Behavior normal.     (all labs ordered are listed, but only abnormal results are displayed) Labs Reviewed  LACTIC ACID, PLASMA - Abnormal; Notable for the following components:      Result Value   Lactic Acid, Venous 3.8 (*)    All other components within normal limits  COMPREHENSIVE METABOLIC PANEL WITH GFR - Abnormal; Notable for the following components:   Glucose, Bld 208 (*)    BUN 27 (*)    Creatinine, Ser 2.05 (*)    Total Protein 6.3 (*)    AST 92 (*)    GFR, Estimated 31 (*)    All other components within normal limits  CBC WITH DIFFERENTIAL/PLATELET - Abnormal; Notable for the following components:   RBC 3.08 (*)     Hemoglobin 9.8 (*)    HCT 30.6 (*)    RDW 15.8 (*)    Platelets 83 (*)    All other components within normal limits  RESP PANEL BY RT-PCR (RSV, FLU A&B, COVID)  RVPGX2  CULTURE, BLOOD (ROUTINE X 2)  CULTURE, BLOOD (ROUTINE X 2)  LACTIC ACID, PLASMA  URINALYSIS, W/ REFLEX TO CULTURE (INFECTION SUSPECTED)    EKG: EKG Interpretation Date/Time:  Saturday September 15 2024 12:40:26 EST Ventricular Rate:  83 PR Interval:  204 QRS Duration:  150 QT Interval:  426 QTC Calculation: 501 R Axis:   109  Text Interpretation: Sinus rhythm Atrial premature complex No significant change since last tracing Confirmed by Emil Share [phone]) on 09/15/2024 1:28:30 PM  Radiology: [facility] Chest Port 1 View Result Date: 09/15/2024 EXAM: 1 VIEW(S) XRAY OF THE CHEST 09/15/2024 12:40:23 PM COMPARISON: 07/03/2023 CLINICAL HISTORY: Questionable sepsis - evaluate for abnormality FINDINGS: LUNGS AND PLEURA: Low lung volumes with diffuse coarsened and attenuated bronchovascular markings. Right basilar atelectasis. Right lower lung zone nodular like 1.5 cm spiculated airspace opacity. Trace right pleural effusion. No pneumothorax. HEART AND MEDIASTINUM: Aortic atherosclerosis. BONES AND SOFT TISSUES: No acute osseous abnormality. IMPRESSION: 1. Right lower lung zone nodular like 1.5 cm spiculated airspace opacity. Finding not visualized on CT chest 09/09/24 and likely consolidation/infection. Follow-up PA and lateral chest X-ray is recommended in 3-4 weeks following therapy to ensure resolution. 2. Trace right pleural effusion. Electronically signed by: Morgane Naveau MD 09/15/2024 01:13 PM EST RP Workstation: [id number]   VAS US  ABI WITH/WO TBI Result Date: 09/14/2024  LOWER EXTREMITY DOPPLER STUDY Patient Name:  Kalem Rockwell  Date of Exam:   09/14/2024 Medical Rec #: [id number]               Accession #:    [id number] Date of Birth: 04/18/1941               Patient Gender: M Patient Age:   54 years Exam Location:   [address] Procedure:      VAS US  ABI WITH/WO TBI Referring Phys: NORMAN SERVE --------------------------------------------------------------------------------  Indications: Peripheral artery disease. Patieint reports swelling, numbness and              coldness to the left foot. Right BKA. High Risk Factors: Hypertension, hyperlipidemia, Diabetes, past history of                    smoking, coronary artery disease. Current smokeless tobacco. Other Factors: Right below knee amputation on 12/12/2020.  Comparison Study: On 06/23/2023, a lower arterial Doppler showed an ABI of .69                   on the left. Right BKA. Performing Technologist: Nanetta Shad RVT  Examination Guidelines: A complete evaluation includes at minimum, Doppler waveform signals and systolic blood pressure reading at the level of bilateral brachial, anterior tibial, and posterior tibial arteries, when vessel segments are accessible. Bilateral testing is considered an integral part of a complete examination. Photoelectric Plethysmograph (PPG) waveforms and toe systolic pressure readings are included as required and additional duplex testing as needed. Limited examinations for reoccurring indications may be performed as noted.  ABI Findings: +--------+------------------+-----+--------+--------+ Right   Rt Pressure (mmHg)IndexWaveformComment  +--------+------------------+-----+--------+--------+ [id number]                                     +--------+------------------+-----+--------+--------+ +---------+------------------+-----+--------+-------+ Left     Lt Pressure (mmHg)IndexWaveformComment +---------+------------------+-----+--------+-------+ Brachial 113                                    +---------+------------------+-----+--------+-------+ PTA      107  0.88                 +---------+------------------+-----+--------+-------+ DP       84                0.69                  +---------+------------------+-----+--------+-------+ Great Toe72                0.60                 +---------+------------------+-----+--------+-------+  Left ABIs appear increased compared to prior study on 06/20/2023. Left TBIs appear essentially unchanged compared to prior study on 06/20/2023.  Summary: Right:  Right BKA. Left: Resting left ankle-brachial index indicates mild left lower extremity arterial disease. The left toe-brachial index is abnormal. Left toe pressure is >60 mmHg which suggests adequate perfusion for healing. *See table(s) above for measurements and observations.  Electronically signed by Norman Serve on 09/14/2024 at 1:32:17 PM.    Final      Procedures   Medications Ordered in the ED  cefTRIAXone  (ROCEPHIN ) 2 g in sodium chloride  0.9 % 100 mL IVPB (2 g Intravenous New Bag/Given 09/15/24 1357)  azithromycin  (ZITHROMAX ) 500 mg in sodium chloride  0.9 % 250 mL IVPB (500 mg Intravenous New Bag/Given 09/15/24 1357)  sodium chloride  0.9 % bolus 1,000 mL (1,000 mLs Intravenous New Bag/Given 09/15/24 1306)  acetaminophen  (TYLENOL ) tablet 1,000 mg (1,000 mg Oral Given 09/15/24 1307)  ondansetron  (ZOFRAN ) injection 4 mg (4 mg Intravenous Given 09/15/24 1307)                                    Medical Decision Making Amount and/or Complexity of Data Reviewed Labs: ordered. Radiology: ordered.  Risk OTC drugs. Prescription drug management.   83 yo M with a chief complaints of cough congestion going on for a day or 2.  Exam with some concern for right lower lobe pneumonia.  Not eating and drinking well at home.  IV fluids blood work reassess.  Patient with worsening acute kidney injury.  Was actually seen last week in the ED and had a workup with some signs of acute kidney injury.  Reportedly has not been eating and drinking well since.    Lactate 3.8.  Chest x-ray on my independent interpretation with some haziness about the diaphragm and heart border on the  right.  Started on IV antibiotics.  Will discussed with medicine for admission.  The patients results and plan were reviewed and discussed.   Any x-rays performed were independently reviewed by myself.   Differential diagnosis were considered with the presenting HPI.  Medications  cefTRIAXone  (ROCEPHIN ) 2 g in sodium chloride  0.9 % 100 mL IVPB (2 g Intravenous New Bag/Given 09/15/24 1357)  azithromycin  (ZITHROMAX ) 500 mg in sodium chloride  0.9 % 250 mL IVPB (500 mg Intravenous New Bag/Given 09/15/24 1357)  sodium chloride  0.9 % bolus 1,000 mL (1,000 mLs Intravenous New Bag/Given 09/15/24 1306)  acetaminophen  (TYLENOL ) tablet 1,000 mg (1,000 mg Oral Given 09/15/24 1307)  ondansetron  (ZOFRAN ) injection 4 mg (4 mg Intravenous Given 09/15/24 1307)    Vitals:   09/15/24 1217 09/15/24 1230 09/15/24 1310  BP: 98/69 99/68 103/75  Pulse: 85 81 80  Resp: 16 16 18   Temp: 98.4 F (36.9 C)    TempSrc: Oral    SpO2: 97% 96% 94%    Final diagnoses:  Pneumonia of right lower lobe due to infectious organism    Admission/ observation were discussed with the admitting physician, patient and/or family and they are comfortable with the plan.       Final diagnoses:  Pneumonia of right lower lobe due to infectious organism    ED Discharge Orders     None          Emil Share, DO 09/15/24 1417

## 2024-09-15 NOTE — Assessment & Plan Note (Signed)
 Stable, no chest pain. --Resume ASA/Plavix , statin

## 2024-09-15 NOTE — Assessment & Plan Note (Signed)
 Sliding scale Novolog  Med history pending - resume home basal and other regimen when confirmed.   Hold metformin 

## 2024-09-15 NOTE — Assessment & Plan Note (Signed)
 Resume statin

## 2024-09-15 NOTE — Assessment & Plan Note (Signed)
 Resume terazosin

## 2024-09-15 NOTE — Assessment & Plan Note (Signed)
 See hypotension. Hold antihypertensives.

## 2024-09-15 NOTE — Assessment & Plan Note (Signed)
 Etiology unclear.  Pt was seen in the ED on 12/7 for abdominal pain.  Work up then including CT abdomen/pelvis was unrevealing.  He was d/c'd on Protonix  and Carafate  -- continue these. Supportive care & monitor closely. Stool softeners PRN.

## 2024-09-15 NOTE — Assessment & Plan Note (Signed)
 Initial lactate 3.6 >> 2.5 after initial 1L fluids in the ED.  2nd liter was being given for BP support.  Pt did not meet any SIRS criteria to meet sepsis criteria on admission --Trend lactate --Follow blood cultures

## 2024-09-15 NOTE — Assessment & Plan Note (Signed)
 See AKI ?

## 2024-09-15 NOTE — Assessment & Plan Note (Signed)
 Resume gabapentin  pending med history verification

## 2024-09-15 NOTE — ED Notes (Signed)
Keith Espinoza with cl called for transport

## 2024-09-15 NOTE — Assessment & Plan Note (Signed)
 Superimposed on baseline CKD stage IIIa. CR on admission 2.05 up from 1.60 on 12/7.  Family report very poor p.o. intake for some time but especially the past week since he was seen for abdominal pain 12/7.  Suspect prerenal.  Does have history of mildly reduced EF but seems dry on exam, less cardiorenal.   For this and lactic acidosis, patient received 2 L IV fluids in the ED. -- Repeat BMP in a.m. -- Hold additional IV fluids for now given mildly reduced EF -- Renally dose meds and avoid nephrotoxins

## 2024-09-15 NOTE — H&P (Addendum)
 " History and Physical    Patient: Keith Espinoza FMW:[id number] DOB: Jan 30, 1941 DOA: 09/15/2024 DOS: the patient was seen and examined on 09/15/2024 PCP: Charlott Dorn LABOR, MD   Referring Provider: Rolan Quale, DO Telemedicine Provider: Burnard Cunning, DO Provider Location: [facility] Patient Location: [facility] Referring Diagnosis: Severe Sepsis due to RLL PNA Patient Name and DOB verified: Keith Espinoza, Nov 20, 1940 Patient consented to Telemedicine Evaluation: Yes RN virtual assistant: Velinda Sharps, RN Video encounter time and date:09/15/24 3:14 PM   Patient coming from: Home  Chief Complaint:  Chief Complaint  Patient presents with   Cough   HPI: [name] is a 83 y.o. male with medical history significant of CAD status post stents,, chronic HFrEF, hypertension, hyperlipidemia, type 2 diabetes with history of right BKA, left femur fracture nonweightbearing x 4 months with subsequent debility and weakness, CKD stage III AA, tobacco abuse who presented to [facility] for evaluation of cough interfering with his sleep and ongoing poor appetite.  Patient had been seen in the ED on 12/7 for abdominal pain, that workup was essentially unremarkable he was discharged with Protonix  and Carafate .  Since that time, family report his still not eating well and definitely not drinking enough.  Patient initially reported wanting to go home, not be admitted but ultimately did agree.  He reports feeling generally weak all over, very drained.  Denies fevers but reports being very cold last night and earlier today.  Cough is at times productive other times dry.  No congestion.  Sore throat only with coughing.  Still has intermittent abdominal pain but no nausea or vomiting.  No diarrhea or constipation.  No abnormal urinary symptoms other than slow stream which he reports his baseline.  He reports history of sometimes getting choked or coughing after swallowing liquids  but not solid foods.  He states not eating very much because he is just not hungry as he is retired and does not work and therefore does not have as much appetite as he used to.  ED course --  Initial vitals --temp 98.4 F, HR 85, RR 16, BP 98/69, SpO2 97% on room air. Labs obtained including CMP and CBC were notable for nonfasting glucose 208, BUN 27, creatinine 2.05 (up from 1.60 on 12/7), AST 92, hemoglobin 9.8 (stable from 10.0 on 12/7), platelets 83 (95 on 12/7). Initial lactic acid was 3.8 >> improved to 2.5 after first liter bolus IV fluids. Viral PCR is negative for COVID, flu A/B, RSV Later this afternoon, patient's BP remained soft as low as 86/70, EDP ordered a second liter NS fluid bolus. Imaging --portable chest x-ray showed right lower lung opacity described as 1.5 cm spiculated that was not visualized on CT chest on 12/7 and therefore likely consolidation/infection.  Follow-up PA and lateral chest x-ray in 3 to 4 weeks recommended to ensure resolution.  Trace right pleural effusion.  ED treatment --fosamil gram Tylenol , 4 mg IV Zofran , IV Rocephin  and Zithromax  for antibiotics 2 L NS IV fluids.  Blood cultures were obtained.  Patient is being admitted to progressive floor for further evaluation and management of community-acquired pneumonia, AKI, hypotension and lactic acidosis as outlined in detail below.  He does not technically meet criteria for sepsis based on SIRS criteria (afebrile, no leukocytosis, normal HR and RR) at time of admission.     Review of Systems: As mentioned in the history of present illness. All other systems reviewed and are negative.   Past Medical History:  Diagnosis Date   Allergic rhinitis    Anemia    Aortic stenosis    Arthritis    Basal cell carcinoma 11/01/2019    bcc left chest treatment TX cx3 [id number]    CHF (congestive heart failure) (HCC)    Chronic leg pain    right   Chronic lower back pain    Coronary artery disease    a. Stenting to  RCA 2004; staged DES to LAD and Cx 2004. DES to [facility] 2012. b. DES to mCx, PTCA to dCx 11/2011. c. Lateral wall MI 2013 s/p PTCA to distal Cx & DES to mid OM2 11/2011. d. Low risk nuc 04/2014, EF wnl.   COVID-19    Diabetes mellitus    Insulin  dependent   Diabetic neuropathy (HCC)    MILD   Diverticulosis    Dysrhythmia    Bertrum syndrome    Gout    right wrist; right foot; right elbow; have had it since 1970's   H/O hiatal hernia    Heart murmur    History of echocardiogram    aortic sclerosis per echo 12/09 EF 65%, otherwise normal   History of hemorrhoids    BLEEDING   History of kidney stones    h/o   Hypertension    Diagnosed 1995    Myocardial infarction [facility])    Pancreatic pseudocyst    a. s/p remote drainage 2006.   Thrombocytopenia    Seen on oldest labs in system from 2004   Vitamin B 12 deficiency    orally replaced   Past Surgical History:  Procedure Laterality Date   ABDOMINAL AORTOGRAM W/LOWER EXTREMITY Bilateral 08/08/2020   Procedure: ABDOMINAL AORTOGRAM W/LOWER EXTREMITY;  Surgeon: Eliza Lonni RAMAN, MD;  Location: [facility] INVASIVE CV LAB;  Service: Cardiovascular;  Laterality: Bilateral;   AMPUTATION Right 12/12/2020   Procedure: RIGHT BELOW KNEE AMPUTATION;  Surgeon: Harden Jerona GAILS, MD;  Location: [facility] OR;  Service: Orthopedics;  Laterality: Right;   BACK SURGERY     total of 3 times S/P fall    CARPAL TUNNEL RELEASE Bilateral    CHOLECYSTECTOMY  1990's   CIRCUMCISION N/A 10/12/2023   Procedure: Dorsal slit;  Surgeon: [name], MD;  Location: [facility] ORS;  Service: Urology;  Laterality: N/A;   COLONOSCOPY     COLONOSCOPY Left 07/08/2023   Procedure: COLONOSCOPY;  Surgeon: San Sandor GAILS, DO;  Location: [facility] ENDOSCOPY;  Service: Gastroenterology;  Laterality: Left;   CORONARY ANGIOPLASTY  11/11/11   CORONARY ANGIOPLASTY WITH STENT PLACEMENT  09/30/2011   1 then; makes a total of 4   CORONARY ANGIOPLASTY WITH STENT PLACEMENT  11/11/11   1; makes a total of  5   ESOPHAGOGASTRODUODENOSCOPY Left 07/08/2023   Procedure: ESOPHAGOGASTRODUODENOSCOPY (EGD);  Surgeon: San Sandor GAILS, DO;  Location: [facility] ENDOSCOPY;  Service: Gastroenterology;  Laterality: Left;   HEMOSTASIS CLIP PLACEMENT  07/08/2023   Procedure: HEMOSTASIS CLIP PLACEMENT;  Surgeon: San Sandor GAILS, DO;  Location: [facility] ENDOSCOPY;  Service: Gastroenterology;;   HOT HEMOSTASIS N/A 07/08/2023   Procedure: HOT HEMOSTASIS (ARGON PLASMA COAGULATION/BICAP);  Surgeon: San Sandor GAILS, DO;  Location: [facility] ENDOSCOPY;  Service: Gastroenterology;  Laterality: N/A;   INGUINAL HERNIA REPAIR  2003   right   JOINT REPLACEMENT Right 04/03/2002   hip replacment   KNEE ARTHROSCOPY  1990's   left   LEFT HEART CATH AND CORONARY ANGIOGRAPHY N/A 04/25/2023   Procedure: LEFT HEART CATH AND CORONARY ANGIOGRAPHY;  Surgeon: Anner Alm ORN, MD;  Location: [facility] INVASIVE CV LAB;  Service: Cardiovascular;  Laterality: N/A;   LEFT HEART CATHETERIZATION WITH CORONARY ANGIOGRAM N/A 09/30/2011   Procedure: LEFT HEART CATHETERIZATION WITH CORONARY ANGIOGRAM;  Surgeon: Candyce GORMAN Reek, MD;  Location: [facility] CATH LAB;  Service: Cardiovascular;  Laterality: N/A;  possible PCI   LEFT HEART CATHETERIZATION WITH CORONARY ANGIOGRAM N/A 11/15/2011   Procedure: LEFT HEART CATHETERIZATION WITH CORONARY ANGIOGRAM;  Surgeon: Candyce GORMAN Reek, MD;  Location: [facility] CATH LAB;  Service: Cardiovascular;  Laterality: N/A;   PERCUTANEOUS CORONARY STENT INTERVENTION (PCI-S)  09/30/2011   Procedure: PERCUTANEOUS CORONARY STENT INTERVENTION (PCI-S);  Surgeon: Candyce GORMAN Reek, MD;  Location: [facility] CATH LAB;  Service: Cardiovascular;;   PERCUTANEOUS CORONARY STENT INTERVENTION (PCI-S) N/A 11/11/2011   Procedure: PERCUTANEOUS CORONARY STENT INTERVENTION (PCI-S);  Surgeon: Candyce GORMAN Reek, MD;  Location: [facility] CATH LAB;  Service: Cardiovascular;  Laterality: N/A;   PERIPHERAL VASCULAR BALLOON ANGIOPLASTY Right 08/08/2020   Procedure: PERIPHERAL  VASCULAR BALLOON ANGIOPLASTY;  Surgeon: Eliza Lonni GORMAN, MD;  Location: [facility] INVASIVE CV LAB;  Service: Cardiovascular;  Laterality: Right;  Posterior tibial    SHOULDER SURGERY Right    X 2   STUMP REVISION Right 01/09/2021   Procedure: REVISION RIGHT BELOW KNEE AMPUTATION;  Surgeon: Harden Jerona GAILS, MD;  Location: [facility] OR;  Service: Orthopedics;  Laterality: Right;   TONSILLECTOMY  ~ 1948   TOTAL HIP REVISION Right 04/13/2019   Procedure: RIGHT TOTAL HIP REVISION-POSTERIOR  APPROACH LATERAL;  Surgeon: Barbarann Oneil BROCKS, MD;  Location: [facility] OR;  Service: Orthopedics;  Laterality: Right;   TOTAL KNEE ARTHROPLASTY Left 07/23/2016   Procedure: LEFT TOTAL KNEE ARTHROPLASTY;  Surgeon: Oneil BROCKS Barbarann, MD;  Location: [facility] OR;  Service: Orthopedics;  Laterality: Left;   Social History:  reports that he has quit smoking. His smoking use included cigarettes. His smokeless tobacco use includes chew. He reports that he does not drink alcohol and does not use drugs.  Allergies[1]  Family History  Problem Relation Age of Onset   Diabetes Mother    Hyperlipidemia Mother    Hypertension Mother    Cancer Father    Hypertension Father    Diabetes Sister    Hypertension Sister    Cancer Brother    Heart attack Neg Hx     Prior to Admission medications  Medication Sig Start Date End Date Taking? Authorizing Provider  acetaminophen  (TYLENOL ) 500 MG tablet Take 2 tablets (1,000 mg total) by mouth every 6 (six) hours. Patient taking differently: Take 1,000 mg by mouth daily as needed for mild pain (pain score 1-3) or moderate pain (pain score 4-6). 10/12/23   [name], MD  allopurinol  (ZYLOPRIM ) 300 MG tablet Take 300 mg by mouth daily.    [provider]  Ascorbic Acid (VITAMIN C PO) Take 1 tablet by mouth daily.    [provider]  aspirin  EC 81 MG tablet Take 81 mg by mouth daily. Swallow whole.    [provider]  Cholecalciferol  (VITAMIN D3) 50 MCG (2000 UT) TABS Take 2,000  Units by mouth daily.    [provider]  clopidogrel  (PLAVIX ) 75 MG tablet Take 1 tablet (75 mg total) by mouth daily. 08/12/23   [name], PA-C  Continuous Glucose Sensor (DEXCOM G7 SENSOR) MISC 1 Device by Does not apply route as directed. 09/10/24   [name], [name], MD  Cyanocobalamin  (B-12) 1000 MCG TABS Take 1,000 mcg by mouth daily.    [provider]  empagliflozin  (  JARDIANCE ) 25 MG TABS tablet Take 1 tablet (25 mg total) by mouth daily. 06/08/24   [name], [name], MD  furosemide  (LASIX ) 20 MG tablet Take 1 tablet (20 mg total) by mouth daily. 07/10/23   [name], MD  gabapentin  (NEURONTIN ) 400 MG capsule Take 1 capsule (400 mg total) by mouth daily. 07/10/23   [name], MD  insulin  aspart (NOVOLOG  FLEXPEN) 100 UNIT/ML FlexPen Max daily 30 units 06/08/24   [name], [name], MD  insulin  degludec (TRESIBA  FLEXTOUCH) 100 UNIT/ML FlexTouch Pen Inject 14 Units into the skin daily. 06/08/24   [name], [name], MD  insulin  lispro (HUMALOG  KWIKPEN) 100 UNIT/ML KwikPen Max daily 30 units 06/11/24   [name], [name], MD  Insulin  Pen Needle 32G X 4 MM MISC 1 Device by Does not apply route in the morning, at noon, in the evening, and at bedtime. 06/08/24   [name], [name], MD  KLOR-CON  M20 20 MEQ tablet Take 20 mEq by mouth daily.    [provider]  Magnesium  400 MG CAPS Take 400 mg by mouth daily.    [provider]  metFORMIN  (GLUCOPHAGE ) 1000 MG tablet Take 1 tablet (1,000 mg total) by mouth 2 (two) times daily. 06/08/24   [name], [name], MD  nitroGLYCERIN  (NITROSTAT ) 0.4 MG SL tablet Place 1 tablet (0.4 mg total) under the tongue every 5 (five) minutes x 3 doses as needed for chest pain. 04/16/24   [name], MD  Omega-3 Fatty Acids (FISH OIL) 500 MG CAPS Take 500 mg by mouth daily.    [provider]  pantoprazole  (PROTONIX ) 40 MG tablet Take 1 tablet (40 mg total) by mouth  daily. 09/09/24   [name], MD  rosuvastatin  (CRESTOR ) 20 MG tablet Take 1 tablet (20 mg total) by mouth daily. 07/27/24   [name], MD  senna-docusate (SENOKOT-S) 8.6-50 MG tablet Take 1 tablet by mouth at bedtime. Patient taking differently: Take 1 tablet by mouth 2 (two) times daily. 02/03/21   [name], MD  sucralfate  (CARAFATE ) 1 GM/10ML suspension Take 10 mLs (1 g total) by mouth 4 (four) times daily -  with meals and at bedtime. 09/09/24   [name], MD  terazosin  (HYTRIN ) 5 MG capsule Take 5 mg by mouth 2 (two) times daily.    [provider]  Turmeric (QC TUMERIC COMPLEX PO) Take 500 mg by mouth daily.    [provider]    Physical Exam: Vitals:   09/15/24 1515 09/15/24 1530 09/15/24 1600 09/15/24 1639  BP: 96/84 102/79 118/80 106/69  Pulse: 95 93 91 90  Resp: 19 20 18 16   Temp:    (!) 97.4 F (36.3 C)  TempSrc:    Oral  SpO2: 98% 99% 99% 97%   Bedside physical exam was performed by RN listed above. Below exam findings are based on their in person physical exam findings and my observations during virtual encounter.  General exam: awake, alert, no acute distress, disheveled appearing HEENT: Voice clear, hearing grossly normal  Respiratory system: Lungs overall clear, mildly diminished right base, no wheezes or rhonchi, normal respiratory effort.  On room air Cardiovascular system: normal S1/S2, RRR, 2+ pitting left lower extremity edema Gastrointestinal system: soft, NT, ND, bowel sounds present Central nervous system: A&O x 3. no gross focal neurologic deficits, normal speech Extremities: Right BKA, 2+ LLE edema Skin: RN reports skin dry and normal temp, mild venous stasis hyperpigmentation of the distal left lower extremity Psychiatry: normal mood,  congruent affect, judgement and insight appear normal   Data Reviewed:  Labs and diagnostic studies as reviewed in detail above  Assessment and Plan:  * Right lower lobe pneumonia RLL  consolidation on CXR today, pt presenting with cough, chills.  Not hypoxic on admission.  Pt does report sometimes coughing with swallowing liquids, not solid foods. --IV antibiotics: Rocephin , Zithromax  --Scheduled Mucinex  --Sputum culture --O2 as needed, per protocol --Flutter valve --PRN Duonebs --SLP for swallow evaluation   Lactic acidosis Initial lactate 3.6 >> 2.5 after initial 1L fluids in the ED.  2nd liter was being given for BP support.  Pt did not meet any SIRS criteria to meet sepsis criteria on admission --Trend lactate --Follow blood cultures  Hypotension BP's in the ED soft, initial 98/69 >> 109/79 >> laster 86/70.  EDP gave a 2nd liter bolus IVF for BP which improved to 118/80. --Maintain MAP > 65 --Hold further IVF for now and monitor closely.  Pt has EF 40-45%.  --Caution with fluids  Abdominal pain Etiology unclear.  Pt was seen in the ED on 12/7 for abdominal pain.  Work up then including CT abdomen/pelvis was unrevealing.  He was d/c'd on Protonix  and Carafate  -- continue these. Supportive care & monitor closely. Stool softeners PRN.  Chronic heart failure with mildly reduced ejection fraction (HFmrEF, 41-49%) (HCC) Clinically patient dry on exam, with AKI and lactic acidosis, soft BPs, was given 2 L fluids in the ED.  -- Monitor volume and respiratory status closely --Hold off additional fluids for now --Diuresis as needed   AKI (acute kidney injury) Superimposed on baseline CKD stage IIIa. CR on admission 2.05 up from 1.60 on 12/7.  Family report very poor p.o. intake for some time but especially the past week since he was seen for abdominal pain 12/7.  Suspect prerenal.  Does have history of mildly reduced EF but seems dry on exam, less cardiorenal.   For this and lactic acidosis, patient received 2 L IV fluids in the ED. -- Repeat BMP in a.m. -- Hold additional IV fluids for now given mildly reduced EF -- Renally dose meds and avoid  nephrotoxins   Type 2 diabetes mellitus with hyperglycemia, with long-term current use of insulin  (HCC) Sliding scale Novolog  Med history pending - resume home basal and other regimen when confirmed.   Hold metformin   Weakness General debility and weakness in setting of recently being 4 months non-weight-bearing with a left femur fracture. --PT/OT evaluations  CKD stage 3a, GFR 45-59 ml/min (HCC) See AKI  Peripheral neuropathy Resume gabapentin  pending med history verification  Enlarged prostate Resume terazosin   Tobacco abuse Cessation encouraged.  Coronary artery disease Stable, no chest pain. --Resume ASA/Plavix , statin  Hyperlipidemia Resume statin  Hypertension See hypotension. Hold antihypertensives.  Paroxysmal A-fib (HCC) Rate controlled. Sinus rhythm on admission. --Telemetry x 24 hrs --Appears on ASA/Plavix , not anticoagulation --Med hx pending but appears not on rate control agent  Thrombocytopenia Chronic. Slightly below baseline at 83k on admission.    --Will hold anti-platelets and Lovenox  for now --Monitor CBC   MED HISTORY PENDING -- please review once complete      Advance Care Planning: CODE STATUS full code Patient stated that in the event of cardiac or respiratory arrest he would want attempts at resuscitation.  Daughter was present in the room during this discussion.  Would recommend continue to address CODE STATUS as he was previously DNR according to chart review back in September 2024 and August 2025  Consults: None at this time  Family Communication: Daughter at bedside during virtual admission encounter  Severity of Illness: The appropriate patient status for this patient is OBSERVATION. Observation status is judged to be reasonable and necessary in order to provide the required intensity of service to ensure the patient's safety. The patient's presenting symptoms, physical exam findings, and initial radiographic and laboratory  data in the context of their medical condition is felt to place them at decreased risk for further clinical deterioration. Furthermore, it is anticipated that the patient will be medically stable for discharge from the hospital within 2 midnights of admission.   Author: Burnard DELENA Cunning, DO 09/15/2024 5:22 PM  For on call review [email or web address].      [1]  Allergies Allergen Reactions   Simvastatin Other (See Comments)    SEVERE MYALGIAS    Zetia [Ezetimibe] Other (See Comments)    MYALGIAS   Dilaudid  [Hydromorphone  Hcl] Other (See Comments)    hallucination   "

## 2024-09-15 NOTE — Assessment & Plan Note (Signed)
 General debility and weakness in setting of recently being 4 months non-weight-bearing with a left femur fracture. --PT/OT evaluations

## 2024-09-15 NOTE — Assessment & Plan Note (Signed)
 Rate controlled. Sinus rhythm on admission. --Telemetry x 24 hrs --Appears on ASA/Plavix , not anticoagulation --Med hx pending but appears not on rate control agent

## 2024-09-15 NOTE — Assessment & Plan Note (Signed)
 Cessation encouraged

## 2024-09-15 NOTE — Assessment & Plan Note (Signed)
 Clinically patient dry on exam, with AKI and lactic acidosis, soft BPs, was given 2 L fluids in the ED.  -- Monitor volume and respiratory status closely --Hold off additional fluids for now --Diuresis as needed

## 2024-09-15 NOTE — Assessment & Plan Note (Signed)
 Chronic. Slightly below baseline at 83k on admission.    --Will hold anti-platelets and Lovenox  for now --Monitor CBC

## 2024-09-15 NOTE — ED Notes (Signed)
 New bed assignment [facility] [address]- george with cl called for transport

## 2024-09-16 ENCOUNTER — Inpatient Hospital Stay (HOSPITAL_COMMUNITY)

## 2024-09-16 DIAGNOSIS — J189 Pneumonia, unspecified organism: Secondary | ICD-10-CM | POA: Diagnosis present

## 2024-09-16 DIAGNOSIS — E1122 Type 2 diabetes mellitus with diabetic chronic kidney disease: Secondary | ICD-10-CM | POA: Diagnosis present

## 2024-09-16 DIAGNOSIS — Z1152 Encounter for screening for COVID-19: Secondary | ICD-10-CM | POA: Diagnosis not present

## 2024-09-16 DIAGNOSIS — Z89511 Acquired absence of right leg below knee: Secondary | ICD-10-CM | POA: Diagnosis not present

## 2024-09-16 DIAGNOSIS — A419 Sepsis, unspecified organism: Secondary | ICD-10-CM | POA: Diagnosis present

## 2024-09-16 DIAGNOSIS — I5022 Chronic systolic (congestive) heart failure: Secondary | ICD-10-CM | POA: Diagnosis present

## 2024-09-16 DIAGNOSIS — D696 Thrombocytopenia, unspecified: Secondary | ICD-10-CM | POA: Diagnosis present

## 2024-09-16 DIAGNOSIS — I480 Paroxysmal atrial fibrillation: Secondary | ICD-10-CM | POA: Diagnosis present

## 2024-09-16 DIAGNOSIS — E872 Acidosis, unspecified: Secondary | ICD-10-CM | POA: Diagnosis present

## 2024-09-16 DIAGNOSIS — F32A Depression, unspecified: Secondary | ICD-10-CM | POA: Diagnosis present

## 2024-09-16 DIAGNOSIS — E1140 Type 2 diabetes mellitus with diabetic neuropathy, unspecified: Secondary | ICD-10-CM | POA: Diagnosis present

## 2024-09-16 DIAGNOSIS — Z96652 Presence of left artificial knee joint: Secondary | ICD-10-CM | POA: Diagnosis present

## 2024-09-16 DIAGNOSIS — R627 Adult failure to thrive: Secondary | ICD-10-CM | POA: Diagnosis present

## 2024-09-16 DIAGNOSIS — E1165 Type 2 diabetes mellitus with hyperglycemia: Secondary | ICD-10-CM | POA: Diagnosis present

## 2024-09-16 DIAGNOSIS — I130 Hypertensive heart and chronic kidney disease with heart failure and stage 1 through stage 4 chronic kidney disease, or unspecified chronic kidney disease: Secondary | ICD-10-CM | POA: Diagnosis present

## 2024-09-16 DIAGNOSIS — E785 Hyperlipidemia, unspecified: Secondary | ICD-10-CM | POA: Diagnosis present

## 2024-09-16 DIAGNOSIS — Z8616 Personal history of COVID-19: Secondary | ICD-10-CM | POA: Diagnosis not present

## 2024-09-16 DIAGNOSIS — I2510 Atherosclerotic heart disease of native coronary artery without angina pectoris: Secondary | ICD-10-CM | POA: Diagnosis present

## 2024-09-16 DIAGNOSIS — R6520 Severe sepsis without septic shock: Secondary | ICD-10-CM | POA: Diagnosis present

## 2024-09-16 DIAGNOSIS — N179 Acute kidney failure, unspecified: Secondary | ICD-10-CM | POA: Diagnosis present

## 2024-09-16 DIAGNOSIS — N200 Calculus of kidney: Secondary | ICD-10-CM | POA: Diagnosis not present

## 2024-09-16 DIAGNOSIS — Z794 Long term (current) use of insulin: Secondary | ICD-10-CM | POA: Diagnosis not present

## 2024-09-16 DIAGNOSIS — Z7902 Long term (current) use of antithrombotics/antiplatelets: Secondary | ICD-10-CM | POA: Diagnosis not present

## 2024-09-16 DIAGNOSIS — N1831 Chronic kidney disease, stage 3a: Secondary | ICD-10-CM | POA: Diagnosis present

## 2024-09-16 DIAGNOSIS — G629 Polyneuropathy, unspecified: Secondary | ICD-10-CM | POA: Diagnosis present

## 2024-09-16 LAB — LACTIC ACID, PLASMA
Lactic Acid, Venous: 2.6 mmol/L (ref 0.5–1.9)
Lactic Acid, Venous: 2 mmol/L (ref 0.5–1.9)

## 2024-09-16 LAB — RESPIRATORY PANEL BY PCR

## 2024-09-16 LAB — CBC
HCT: 31 % — ABNORMAL LOW (ref 39.0–52.0)
Hemoglobin: 9.6 g/dL — ABNORMAL LOW (ref 13.0–17.0)
MCH: 31.3 pg (ref 26.0–34.0)
MCHC: 31 g/dL (ref 30.0–36.0)
MCV: 101 fL — ABNORMAL HIGH (ref 80.0–100.0)
Platelets: 85 K/uL — ABNORMAL LOW (ref 150–400)
RBC: 3.07 MIL/uL — ABNORMAL LOW (ref 4.22–5.81)
RDW: 15.9 % — ABNORMAL HIGH (ref 11.5–15.5)
WBC: 5.8 K/uL (ref 4.0–10.5)
nRBC: 0 % (ref 0.0–0.2)

## 2024-09-16 LAB — GLUCOSE, CAPILLARY
Glucose-Capillary: 194 mg/dL — ABNORMAL HIGH (ref 70–99)
Glucose-Capillary: 215 mg/dL — ABNORMAL HIGH (ref 70–99)
Glucose-Capillary: 216 mg/dL — ABNORMAL HIGH (ref 70–99)
Glucose-Capillary: 181 mg/dL — ABNORMAL HIGH (ref 70–99)

## 2024-09-16 LAB — BASIC METABOLIC PANEL WITH GFR
Anion gap: 13 (ref 5–15)
BUN: 29 mg/dL — ABNORMAL HIGH (ref 8–23)
CO2: 23 mmol/L (ref 22–32)
Calcium: 8.8 mg/dL — ABNORMAL LOW (ref 8.9–10.3)
Chloride: 101 mmol/L (ref 98–111)
Creatinine, Ser: 2.11 mg/dL — ABNORMAL HIGH (ref 0.61–1.24)
GFR, Estimated: 30 mL/min — ABNORMAL LOW (ref >60)
Glucose, Bld: 200 mg/dL — ABNORMAL HIGH (ref 70–99)
Potassium: 5.1 mmol/L (ref 3.5–5.1)
Sodium: 137 mmol/L (ref 135–145)

## 2024-09-16 LAB — PROCALCITONIN: Procalcitonin: <0.1 ng/mL

## 2024-09-16 MED ORDER — ALBUMIN HUMAN 25 % IV SOLN
12.5000 g | Freq: Four times a day (QID) | INTRAVENOUS | Status: AC
  Administered 2024-09-16 – 2024-09-17 (×3): 12.5 g via INTRAVENOUS
  Filled 2024-09-16 (×3): qty 50

## 2024-09-16 MED ORDER — PANTOPRAZOLE SODIUM 40 MG IV SOLR
40.0000 mg | Freq: Two times a day (BID) | INTRAVENOUS | Status: DC
  Administered 2024-09-16 – 2024-09-19 (×6): 40 mg via INTRAVENOUS
  Filled 2024-09-16 (×6): qty 10

## 2024-09-16 MED ORDER — ORAL CARE MOUTH RINSE: 15.0000 mL | OROMUCOSAL | Status: DC | PRN

## 2024-09-16 MED ORDER — ONDANSETRON HCL 4 MG/2ML IJ SOLN
4.0000 mg | Freq: Four times a day (QID) | INTRAMUSCULAR | Status: DC | PRN
  Administered 2024-09-16: 4 mg via INTRAVENOUS
  Filled 2024-09-16: qty 2

## 2024-09-16 MED ORDER — GUAIFENESIN-DM 100-10 MG/5ML PO SYRP
5.0000 mL | ORAL_SOLUTION | ORAL | Status: DC | PRN
  Administered 2024-09-16: 5 mL via ORAL
  Filled 2024-09-16: qty 10

## 2024-09-16 MED ORDER — PANTOPRAZOLE SODIUM 40 MG PO TBEC: 40.0000 mg | DELAYED_RELEASE_TABLET | Freq: Two times a day (BID) | ORAL | Status: DC

## 2024-09-16 NOTE — Progress Notes (Signed)
 PROGRESS NOTE    Keith Espinoza  FMW:[id number] DOB: 10-22-1940 DOA: 09/15/2024 PCP: Charlott Dorn LABOR, MD   Brief Narrative: 83 year old with past medical history significant for CAD status post stents, chronic heart failure reduced ejection fraction, hypertension, hyperlipidemia, diabetes type 2 history of right BKA, left femur fracture nonweightbearing for 72-month with subsequent debility and weakness, CKD stage IIIa, tobacco abuse presents for further evaluation of cough, ongoing poor appetite.  Of note patient had been seen in the ED 12/7 for abdominal pain workup was unremarkable and he was discharged on Protonix  and Carafate .  He still have intermittent abdominal pain.  No urinary symptoms.  Evaluation in the ED creatinine 2.0, AST 92, hemoglobin 9.8, lactic acid 3.8 improved to 2.5 after IV fluids.  Viral PCR and COVID and flu negative.  Patient blood pressure was soft 86/70.  Received IV fluids.  Chest x-ray showed right lower lobe opacity described as 1.5 cm spiculated that was not visualized on CT chest 12/7.  Patient has been admitted for management of pneumonia, AKI, hypotension and lactic acidosis. Assessment & Plan:   Principal Problem:   Right lower lobe pneumonia Active Problems:   Hypotension   Lactic acidosis   Weakness   Type 2 diabetes mellitus with hyperglycemia, with long-term current use of insulin  (HCC)   AKI (acute kidney injury)   Chronic heart failure with mildly reduced ejection fraction (HFmrEF, 41-49%) (HCC)   Abdominal pain   Hypertension   Hyperlipidemia   Coronary artery disease   Tobacco abuse   Enlarged prostate   Peripheral neuropathy   CKD stage 3a, GFR 45-59 ml/min (HCC)   Thrombocytopenia   Paroxysmal A-fib (HCC)  1-Right lower lobe pneumonia: Positive Rhino-Virus.  - Patient presented with cough, poor oral intake and weakness.  Chest x-ray showed right lower lung nodular like 1.5 cm spiculated airspace opacity.  Concern for  infection not seen on previous CT.  Needs follow-up x-ray in 3 to 4 weeks to ensure resolution. - Continue IV ceftriaxone  and azithromycin  - Continue Mucinex   Lactic acidosis: - In the setting of him showing trending down.  Also CKD  Hypotension: - Improved with IV fluid - Hold Jardiance  and Lasix  due to soft blood pressure.  Abdominal pain: - Patient was seen in the ED 12/7 for abdominal pain, CT abdomen and pelvis was unremarkable. He was discharged on Protonix  and Carafate .  Will continue.  Will change PPI to twice daily. Patient reported more suprapubic pain.  Bladder scan and ultrasound kidney ordered  Chronic Systolic heart failure mildly reduced ejection fraction 41 to 49% - Plan to hold Lasix  due to soft blood pressure in the setting of infection.  AKI on CKD stage III A -Creatinine on admission 2.0, previous creatinine 1.6--1.4 - Check renal ultrasound  Diabetes type 2 with hyperglycemia long-term current use of insulin  - Continue to hold metformin  and - Continue sliding scale insulin   Weakness, failure to thrive - Encourage oral intake.  Treat for pneumonia.  PT OT consult  Peripheral neuropathy Resume gabapentin  pending med   Enlarged prostate On terazosin    Tobacco abuse Cessation encouraged.   Coronary artery disease --ASA/Plavix , statin Holding aspirin  and Plavix  due to thrombocytopenia hopefully we can resume tomorrow  Hyperlipidemia On statin   Hypertension See hypotension. Hold antihypertensives.   Paroxysmal A-fib (HCC) --Appears on ASA/Plavix , not anticoagulation Plan to hold aspirin  and Plavix  due to thrombocytopenia   Thrombocytopenia Chronic. Slightly below baseline at 83k on admission.    Platelet level baseline  113 130s     Estimated body mass index is 21.26 kg/m as calculated from the following:   Height as of this encounter: 5' 10 (1.778 m).   Weight as of this encounter: 67.2 kg.   DVT prophylaxis: SCDs Code Status: Full  code Family Communication: Daughter who was at bedside Disposition Plan:  Status is: Observation The patient will require care spanning > 2 midnights and should be moved to inpatient because: Management of pneumonia and AKI    Consultants:  None  Procedures:  Renal ultrasound  Antimicrobials:    Subjective: He is not feeling well, he reports still having abdominal pain on and off suprapubic.  He has had bowel movement.  Objective: Vitals:   09/15/24 2013 09/16/24 0100 09/16/24 0111 09/16/24 0516  BP: 107/69 103/61 103/61 116/84  Pulse: 82 82 82 81  Resp:   16 16  Temp:  98.2 F (36.8 C) 98.2 F (36.8 C) 98.3 F (36.8 C)  TempSrc:  Oral Oral Oral  SpO2:   97% 99%  Weight:      Height:        Intake/Output Summary (Last 24 hours) at 09/16/2024 0736 Last data filed at 09/15/2024 2220 Gross per 24 hour  Intake 4398.64 ml  Output 400 ml  Net 3998.64 ml   Filed Weights   09/15/24 1630  Weight: 67.2 kg    Examination:  General exam: Appears calm and comfortable  Respiratory system: Clear to auscultation. Respiratory effort normal. Cardiovascular system: S1 & S2 heard, RRR. No JVD, murmurs, rubs, gallops or clicks. No pedal edema. Gastrointestinal system: Abdomen is nondistended, soft and nontender. No organomegaly or masses felt. Normal bowel sounds heard. Central nervous system: Alert and oriented. No focal neurological deficits. Extremities: Symmetric 5 x 5 power.   Data Reviewed: I have personally reviewed following labs and imaging studies  CBC: Recent Labs  Lab 09/09/24 1908 09/15/24 1249 09/16/24 0159  WBC 4.9 6.0 5.8  NEUTROABS  --  4.4  --   HGB 10.0* 9.8* 9.6*  HCT 30.4* 30.6* 31.0*  MCV 98.7 99.4 101.0*  PLT 95* 83* 85*   Basic Metabolic Panel: Recent Labs  Lab 09/09/24 1908 09/15/24 1249 09/16/24 0159  NA 141 139 137  K 4.1 4.4 5.1  CL 101 101 101  CO2 25 26 23   GLUCOSE 227* 208* 200*  BUN 24* 27* 29*  CREATININE 1.60* 2.05*  2.11*  CALCIUM  9.9 9.9 8.8*   GFR: Estimated Creatinine Clearance: 24.8 mL/min (A) (by C-G formula based on SCr of 2.11 mg/dL (H)). Liver Function Tests: Recent Labs  Lab 09/09/24 1908 09/15/24 1249  AST 21 92*  ALT 25 43  ALKPHOS 68 78  BILITOT 0.7 0.7  PROT 6.4* 6.3*  ALBUMIN  3.8 3.7   Recent Labs  Lab 09/09/24 1908  LIPASE 20   No results for input(s): AMMONIA in the last 168 hours. Coagulation Profile: No results for input(s): INR, PROTIME in the last 168 hours. Cardiac Enzymes: No results for input(s): CKTOTAL, CKMB, CKMBINDEX, TROPONINI in the last 168 hours. BNP (last 3 results) Recent Labs    05/14/24 1416  PROBNP 1,468.0*   HbA1C: No results for input(s): HGBA1C in the last 72 hours. CBG: Recent Labs  Lab 09/15/24 1644 09/15/24 2114 09/16/24 0725  GLUCAP 188* 222* 194*   Lipid Profile: No results for input(s): CHOL, HDL, LDLCALC, TRIG, CHOLHDL, LDLDIRECT in the last 72 hours. Thyroid  Function Tests: No results for input(s): TSH, T4TOTAL, FREET4, T3FREE, THYROIDAB in  the last 72 hours. Anemia Panel: No results for input(s): VITAMINB12, FOLATE, FERRITIN, TIBC, IRON , RETICCTPCT in the last 72 hours. Sepsis Labs: Recent Labs  Lab 09/15/24 1447 09/15/24 1856 09/16/24 0159 09/16/24 0541  PROCALCITON  --  <0.10 <0.10  --   LATICACIDVEN 2.5* 2.7* 2.6* 2.0*    Recent Results (from the past 240 hours)  Resp panel by RT-PCR (RSV, Flu A&B, Covid) Peripheral     Status: None   Collection Time: 09/15/24 12:51 PM   Specimen: Peripheral; Nasal Swab  Result Value Ref Range Status   SARS Coronavirus 2 by RT PCR NEGATIVE NEGATIVE Final    Comment: (NOTE) SARS-CoV-2 target nucleic acids are NOT DETECTED.  The SARS-CoV-2 RNA is generally detectable in upper respiratory specimens during the acute phase of infection. The lowest concentration of SARS-CoV-2 viral copies this assay can detect is 138 copies/mL. A  negative result does not preclude SARS-Cov-2 infection and should not be used as the sole basis for treatment or other patient management decisions. A negative result may occur with  improper specimen collection/handling, submission of specimen other than nasopharyngeal swab, presence of viral mutation(s) within the areas targeted by this assay, and inadequate number of viral copies(<138 copies/mL). A negative result must be combined with clinical observations, patient history, and epidemiological information. The expected result is Negative.  Fact Sheet for Patients:  bloggercourse.com  Fact Sheet for Healthcare Providers:  seriousbroker.it  This test is no t yet approved or cleared by the [place]  FDA and  has been authorized for detection and/or diagnosis of SARS-CoV-2 by FDA under an Emergency Use Authorization (EUA). This EUA will remain  in effect (meaning this test can be used) for the duration of the COVID-19 declaration under Section 564(b)(1) of the Act, 21 U.S.C.section 360bbb-3(b)(1), unless the authorization is terminated  or revoked sooner.       Influenza A by PCR NEGATIVE NEGATIVE Final   Influenza B by PCR NEGATIVE NEGATIVE Final    Comment: (NOTE) The Xpert Xpress SARS-CoV-2/FLU/RSV plus assay is intended as an aid in the diagnosis of influenza from Nasopharyngeal swab specimens and should not be used as a sole basis for treatment. Nasal washings and aspirates are unacceptable for Xpert Xpress SARS-CoV-2/FLU/RSV testing.  Fact Sheet for Patients: bloggercourse.com  Fact Sheet for Healthcare Providers: seriousbroker.it  This test is not yet approved or cleared by the [place]  FDA and has been authorized for detection and/or diagnosis of SARS-CoV-2 by FDA under an Emergency Use Authorization (EUA). This EUA will remain in effect (meaning this test can  be used) for the duration of the COVID-19 declaration under Section 564(b)(1) of the Act, 21 U.S.C. section 360bbb-3(b)(1), unless the authorization is terminated or revoked.     Resp Syncytial Virus by PCR NEGATIVE NEGATIVE Final    Comment: (NOTE) Fact Sheet for Patients: bloggercourse.com  Fact Sheet for Healthcare Providers: seriousbroker.it  This test is not yet approved or cleared by the [place]  FDA and has been authorized for detection and/or diagnosis of SARS-CoV-2 by FDA under an Emergency Use Authorization (EUA). This EUA will remain in effect (meaning this test can be used) for the duration of the COVID-19 declaration under Section 564(b)(1) of the Act, 21 U.S.C. section 360bbb-3(b)(1), unless the authorization is terminated or revoked.  Performed at [facility], [address] [place], [place] [address]   Respiratory (~20 pathogens) panel by PCR     Status: Abnormal   Collection Time: 09/15/24 10:25 PM  Specimen: Nasopharyngeal Swab; Respiratory  Result Value Ref Range Status   Adenovirus NOT DETECTED NOT DETECTED Final   Coronavirus 229E NOT DETECTED NOT DETECTED Final    Comment: (NOTE) The Coronavirus on the Respiratory Panel, DOES NOT test for the novel  Coronavirus (2019 nCoV)    Coronavirus HKU1 NOT DETECTED NOT DETECTED Final   Coronavirus NL63 NOT DETECTED NOT DETECTED Final   Coronavirus OC43 NOT DETECTED NOT DETECTED Final   Metapneumovirus NOT DETECTED NOT DETECTED Final   Rhinovirus / Enterovirus DETECTED (A) NOT DETECTED Final   Influenza A NOT DETECTED NOT DETECTED Final   Influenza B NOT DETECTED NOT DETECTED Final   Parainfluenza Virus 1 NOT DETECTED NOT DETECTED Final   Parainfluenza Virus 2 NOT DETECTED NOT DETECTED Final   Parainfluenza Virus 3 NOT DETECTED NOT DETECTED Final   Parainfluenza Virus 4 NOT DETECTED NOT DETECTED Final   Respiratory Syncytial Virus NOT  DETECTED NOT DETECTED Final   Bordetella pertussis NOT DETECTED NOT DETECTED Final   Bordetella Parapertussis NOT DETECTED NOT DETECTED Final   Chlamydophila pneumoniae NOT DETECTED NOT DETECTED Final   Mycoplasma pneumoniae NOT DETECTED NOT DETECTED Final    Comment: Performed at [facility], [address], [place], [place] [address]         Radiology Studies: DG Chest Port 1 View Result Date: 09/15/2024 EXAM: 1 VIEW(S) XRAY OF THE CHEST 09/15/2024 12:40:23 PM COMPARISON: 07/03/2023 CLINICAL HISTORY: Questionable sepsis - evaluate for abnormality FINDINGS: LUNGS AND PLEURA: Low lung volumes with diffuse coarsened and attenuated bronchovascular markings. Right basilar atelectasis. Right lower lung zone nodular like 1.5 cm spiculated airspace opacity. Trace right pleural effusion. No pneumothorax. HEART AND MEDIASTINUM: Aortic atherosclerosis. BONES AND SOFT TISSUES: No acute osseous abnormality. IMPRESSION: 1. Right lower lung zone nodular like 1.5 cm spiculated airspace opacity. Finding not visualized on CT chest 09/09/24 and likely consolidation/infection. Follow-up PA and lateral chest X-ray is recommended in 3-4 weeks following therapy to ensure resolution. 2. Trace right pleural effusion. Electronically signed by: Morgane Naveau MD 09/15/2024 01:13 PM EST RP Workstation: [id number]   VAS US  ABI WITH/WO TBI Result Date: 09/14/2024  LOWER EXTREMITY DOPPLER STUDY Patient Name:  Janie Strothman  Date of Exam:   09/14/2024 Medical Rec #: [id number]               Accession #:    [id number] Date of Birth: 08-26-1941               Patient Gender: M Patient Age:   4 years Exam Location:  [address] Procedure:      VAS US  ABI WITH/WO TBI Referring Phys: NORMAN SERVE --------------------------------------------------------------------------------  Indications: Peripheral artery disease. Patieint reports swelling, numbness and              coldness to the left foot. Right BKA. High  Risk Factors: Hypertension, hyperlipidemia, Diabetes, past history of                    smoking, coronary artery disease. Current smokeless tobacco. Other Factors: Right below knee amputation on 12/12/2020.  Comparison Study: On 06/23/2023, a lower arterial Doppler showed an ABI of .69                   on the left. Right BKA. Performing Technologist: Nanetta Shad RVT  Examination Guidelines: A complete evaluation includes at minimum, Doppler waveform signals and systolic blood pressure reading at the level of bilateral brachial, anterior tibial, and  posterior tibial arteries, when vessel segments are accessible. Bilateral testing is considered an integral part of a complete examination. Photoelectric Plethysmograph (PPG) waveforms and toe systolic pressure readings are included as required and additional duplex testing as needed. Limited examinations for reoccurring indications may be performed as noted.  ABI Findings: +--------+------------------+-----+--------+--------+ Right   Rt Pressure (mmHg)IndexWaveformComment  +--------+------------------+-----+--------+--------+ [id number]                                     +--------+------------------+-----+--------+--------+ +---------+------------------+-----+--------+-------+ Left     Lt Pressure (mmHg)IndexWaveformComment +---------+------------------+-----+--------+-------+ Brachial 113                                    +---------+------------------+-----+--------+-------+ PTA      107               0.88                 +---------+------------------+-----+--------+-------+ DP       84                0.69                 +---------+------------------+-----+--------+-------+ Great Toe72                0.60                 +---------+------------------+-----+--------+-------+  Left ABIs appear increased compared to prior study on 06/20/2023. Left TBIs appear essentially unchanged compared to prior study on 06/20/2023.   Summary: Right:  Right BKA. Left: Resting left ankle-brachial index indicates mild left lower extremity arterial disease. The left toe-brachial index is abnormal. Left toe pressure is >60 mmHg which suggests adequate perfusion for healing. *See table(s) above for measurements and observations.  Electronically signed by Norman Serve on 09/14/2024 at 1:32:17 PM.    Final         Scheduled Meds:  guaiFENesin   600 mg Oral BID   insulin  aspart  0-9 Units Subcutaneous TID WC   pantoprazole   40 mg Oral Daily   rosuvastatin   10 mg Oral Daily   sucralfate   1 g Oral TID WC & HS   terazosin   5 mg Oral BID   Continuous Infusions:  azithromycin      cefTRIAXone  (ROCEPHIN )  IV       LOS: 0 days    Time spent: 35 minutes,     Reno Clasby A Christabelle Hanzlik, MD [facility]   If 7PM-7AM, please contact night-coverage [email or web address]  09/16/2024, 7:36 AM

## 2024-09-16 NOTE — Evaluation (Signed)
 Physical Therapy Evaluation Patient Details Name: Keith Espinoza MRN: [id number] DOB: [birth year] Today's Date: 09/16/2024  History of Present Illness  83 y.o. male presenting 12/13 with cough, congestion, and chills. Found to have CAP, AKI, hypotension, lactic acidosis. PMH: CAD, chronic HFrEF, HTN, HLD, DMII, R BKA, L femur fracture, CKD stage III AA, tobacco abuse  Clinical Impression  Pt admitted with above diagnosis.  PT resides in basement of home, dtr lives upstairs. Pt reports he is typically independent/mod I with transfers to w/c. He is mod I with bed mobility today, likely very close to baseline for transfers however he declines OOB today. Will follow in acute setting, no f/u recommended post acute  Pt currently with functional limitations due to the deficits listed below (see PT Problem List). Pt will benefit from acute skilled PT to increase their independence and safety with mobility to allow discharge.           If plan is discharge home, recommend the following: Assist for transportation   Can travel by private vehicle        Equipment Recommendations None recommended by PT  Recommendations for Other Services       Functional Status Assessment Patient has had a recent decline in their functional status and demonstrates the ability to make significant improvements in function in a reasonable and predictable amount of time.     Precautions / Restrictions Precautions Recall of Precautions/Restrictions: Intact Restrictions Weight Bearing Restrictions Per Provider Order: No      Mobility  Bed Mobility Overal bed mobility: Modified Independent             General bed mobility comments: incr time, HOB ~ 40 degrees. supine<>sit    Transfers Overall transfer level: Needs assistance                 General transfer comment: pt able to lateral scoot along EOB, but defers OOB transfer as well as SPT    Ambulation/Gait                   Stairs            Wheelchair Mobility     Tilt Bed    Modified Rankin (Stroke Patients Only)       Balance Overall balance assessment: Mild deficits observed, not formally tested (seated only, pt declined standing/OOB)                                           Pertinent Vitals/Pain Pain Assessment Pain Assessment: No/denies pain    Home Living Family/patient expects to be discharged to:: Private residence Living Arrangements: Alone Available Help at Discharge: Family;Available PRN/intermittently Type of Home: House Home Access: Level entry       Home Layout: Able to live on main level with bedroom/bathroom Home Equipment: Rolling Walker (2 wheels);BSC/3in1;Shower seat;Cane - single point;Wheelchair - manual;Wheelchair - power;Hand held shower head;Grab bars - tub/shower;Grab bars - toilet;Adaptive equipment;Lift chair;Other (comment) (drop arn BSC, adjustable bed) Additional Comments: pt reports that his grand daughter and her family lived upstairs in his home, but they do not act like they live together. they occasionally bring him a meal downstairs but he is self sufficient    Prior Function Prior Level of Function : Independent/Modified Independent             Mobility Comments: Mod I with SPT  to w/c--typically wears his prosthesis all day ADLs Comments: Mod I with ADLs from w/c or bed level, completes grooming seated in w/c, stand pivots to shower chair no assists. does own meal prep. daughter takes him to appointments and does his grocery shopping most of the time     Extremity/Trunk Assessment   Upper Extremity Assessment Upper Extremity Assessment: Defer to OT evaluation;Overall [facility] for tasks assessed    Lower Extremity Assessment Lower Extremity Assessment: Overall WFL for tasks assessed;RLE deficits/detail RLE Deficits / Details: old R BKA, residual limb WFL       Communication   Communication Communication:  Impaired Factors Affecting Communication: Hearing impaired    Cognition Arousal: Alert Behavior During Therapy: WFL for tasks assessed/performed   PT - Cognitive impairments: No apparent impairments                       PT - Cognition Comments: dtr states not to ask too many questions, he gets annoyed Following commands: Intact       Cueing Cueing Techniques: Verbal cues     General Comments      Exercises     Assessment/Plan    PT Assessment Patient needs continued PT services  PT Problem List Decreased activity tolerance;Decreased mobility       PT Treatment Interventions DME instruction;Therapeutic activities;Functional mobility training;Therapeutic exercise;Patient/family education    PT Goals (Current goals can be found in the Care Plan section)  Acute Rehab PT Goals Patient Stated Goal: back home asap PT Goal Formulation: With patient/family Time For Goal Achievement: 09/30/24 Potential to Achieve Goals: Good    Frequency Min 2X/week     Co-evaluation               AM-PAC PT 6 Clicks Mobility  Outcome Measure Help needed turning from your back to your side while in a flat bed without using bedrails?: None Help needed moving from lying on your back to sitting on the side of a flat bed without using bedrails?: None Help needed moving to and from a bed to a chair (including a wheelchair)?: A Little Help needed standing up from a chair using your arms (e.g., wheelchair or bedside chair)?: A Little Help needed to walk in hospital room?: A Little Help needed climbing 3-5 steps with a railing? : A Little 6 Click Score: 20    End of Session Equipment Utilized During Treatment: Gait belt Activity Tolerance: Patient tolerated treatment well Patient left: with call bell/phone within reach;in bed;with family/visitor present;with bed alarm set   PT Visit Diagnosis: Other abnormalities of gait and mobility (R26.89)    Time: 8651-8597 PT Time  Calculation (min) (ACUTE ONLY): 14 min   Charges:   PT Evaluation $PT Eval Low Complexity: 1 Low   PT General Charges $$ ACUTE PT VISIT: 1 Visit         Acire Tang, PT  Acute Rehab Dept ([facility]) [phone]  09/16/2024   [facility] 09/16/2024, 2:24 PM

## 2024-09-16 NOTE — Evaluation (Signed)
 Occupational Therapy Evaluation Patient Details Name: Keith Espinoza MRN: [id number] DOB: 07/03/1941 Today's Date: 09/16/2024   History of Present Illness   83 y.o. male presenting 12/13 with cough, congestion, and chills. Found to have CAP, AKI, hypotension, lactic acidosis. PMH: CAD, chronic HFrEF, HTN, HLD, DMII, R BKA, L femur fracture, CKD stage III AA, tobacco abuse     Clinical Impressions PTA, pt lived alone per report, but grand daughter's family lives upstairs and he lived in his basement. Pt reports being mod I for BADL, IADL within his home. Daughter provides transportation for appointments. Upon eval, pt presents with decreased strength and activity tolerance. Pt needing up to set-up for UB ADL and CGA for LB ADL although he defers donning RLE prosthetic at time of eval. Pt able to scoot along EOB with supervision; reporting this is similar to how he gets from bed to chair and back, otherwise for transfers during the day wearing prosthesis and doing a stand or squat pivot. Pt to continue to benefit from acute OT services. Recommending HHOT at discharge.     If plan is discharge home, recommend the following:   A little help with walking and/or transfers;A little help with bathing/dressing/bathroom;Assistance with cooking/housework;Assist for transportation;Help with stairs or ramp for entrance     Functional Status Assessment   Patient has had a recent decline in their functional status and demonstrates the ability to make significant improvements in function in a reasonable and predictable amount of time.     Equipment Recommendations   None recommended by OT     Recommendations for Other Services   PT consult     Precautions/Restrictions   Precautions Precautions: Other (comment) (droplet) Restrictions Weight Bearing Restrictions Per Provider Order: No     Mobility Bed Mobility Overal bed mobility: Modified Independent              General bed mobility comments: incr time    Transfers Overall transfer level: Needs assistance                 General transfer comment: pt able to lateral scoot along EOB, but defers OOB transfer as well as SPT      Balance Overall balance assessment: Mild deficits observed, not formally tested (seated)                                         ADL either performed or assessed with clinical judgement   ADL Overall ADL's : Needs assistance/impaired Eating/Feeding: Modified independent;Sitting   Grooming: Set up;Sitting   Upper Body Bathing: Set up;Sitting   Lower Body Bathing: Set up;Sitting/lateral leans   Upper Body Dressing : Set up;Sitting   Lower Body Dressing: Set up;Sitting/lateral leans Lower Body Dressing Details (indicate cue type and reason): although pt deferred donning prosthesis today Toilet Transfer: Supervision/safety Toilet Transfer Details (indicate cue type and reason): lateral scoot along EOB                 Vision Baseline Vision/History: 1 Wears glasses Patient Visual Report: No change from baseline Vision Assessment?: No apparent visual deficits     Perception         Praxis         Pertinent Vitals/Pain       Extremity/Trunk Assessment Upper Extremity Assessment Upper Extremity Assessment: Overall WFL for tasks assessed;Right hand dominant   Lower Extremity Assessment Lower  Extremity Assessment: Defer to PT evaluation       Communication Communication Communication: Impaired Factors Affecting Communication: Hearing impaired   Cognition Arousal: Alert Behavior During Therapy: WFL for tasks assessed/performed Cognition: No apparent impairments             OT - Cognition Comments: oriented, follows commands, has his own routines                 Following commands: Intact       Cueing  General Comments   Cueing Techniques: Verbal cues      Exercises     Shoulder Instructions       Home Living Family/patient expects to be discharged to:: Private residence Living Arrangements: Alone Available Help at Discharge: Family;Available PRN/intermittently Type of Home: House Home Access: Level entry     Home Layout: Able to live on main level with bedroom/bathroom (lives in basement with no STE from outside and bed and bath/kitchen in basement)     Bathroom Shower/Tub: Producer, Television/film/video: Handicapped height Bathroom Accessibility: Yes How Accessible: Accessible via wheelchair Home Equipment: Rolling Walker (2 wheels);BSC/3in1;Shower seat;Cane - single point;Wheelchair - manual;Wheelchair - power;Hand held shower head;Grab bars - tub/shower;Grab bars - toilet;Adaptive equipment;Lift chair;Other (comment) (drop arm commode, adjustable bed) Adaptive Equipment: Reacher;Sock aid;Long-handled shoe horn;Long-handled sponge Additional Comments: pt reports that his grand daughter and her family lived upstairs in his home, but they do not act like they live together. they occasionally bring him a meal downstairs but he is self sufficient      Prior Functioning/Environment Prior Level of Function : Independent/Modified Independent             Mobility Comments: Mod I with SPT to w/c ADLs Comments: Mod I with ADLs from w/c or bed level, completes grooming seated in w/c, stand pivots to shower chair no assists. does own meal prep. daughter takes him to appointments and does his grocery shopping most of the time    OT Problem List: Decreased strength;Decreased activity tolerance;Impaired balance (sitting and/or standing);Decreased knowledge of use of DME or AE;Decreased knowledge of precautions;Cardiopulmonary status limiting activity   OT Treatment/Interventions: Self-care/ADL training;Therapeutic exercise;DME and/or AE instruction;Therapeutic activities;Patient/family education;Balance training      OT Goals(Current goals can be found in the care plan  section)   Acute Rehab OT Goals Patient Stated Goal: get better OT Goal Formulation: With patient Time For Goal Achievement: 09/30/24 Potential to Achieve Goals: Good   OT Frequency:  Min 2X/week    Co-evaluation              AM-PAC OT 6 Clicks Daily Activity     Outcome Measure Help from another person eating meals?: None Help from another person taking care of personal grooming?: A Little Help from another person toileting, which includes using toliet, bedpan, or urinal?: A Little Help from another person bathing (including washing, rinsing, drying)?: A Little Help from another person to put on and taking off regular upper body clothing?: A Little Help from another person to put on and taking off regular lower body clothing?: None 6 Click Score: 20   End of Session Nurse Communication: Mobility status  Activity Tolerance: Patient tolerated treatment well Patient left: in bed;with call bell/phone within reach;with bed alarm set;with nursing/sitter in room  OT Visit Diagnosis: Unsteadiness on feet (R26.81);Muscle weakness (generalized) (M62.81);Other (comment) (decr activity tolerance)                Time: 9181-9154 OT  Time Calculation (min): 27 min Charges:  OT General Charges $OT Visit: 1 Visit OT Evaluation $OT Eval Low Complexity: 1 Low OT Treatments $Self Care/Home Management : 8-22 mins  Elma JONETTA Lebron FREDERICK, OTR/L [facility] Acute Rehabilitation Office: [phone]   Elma JONETTA Lebron 09/16/2024, 10:04 AM

## 2024-09-16 NOTE — Evaluation (Signed)
 Clinical/Bedside Swallow Evaluation Patient Details  Name: Keith Espinoza MRN: [id number] Date of Birth: 29-Aug-1941  Today's Date: 09/16/2024 Time: SLP Start Time (ACUTE ONLY): 0850 SLP Stop Time (ACUTE ONLY): 0905 SLP Time Calculation (min) (ACUTE ONLY): 15 min  Past Medical History:  Past Medical History:  Diagnosis Date   Allergic rhinitis    Anemia    Aortic stenosis    Arthritis    Basal cell carcinoma 11/01/2019    bcc left chest treatment [place] cx3 92fu    CHF (congestive heart failure) (HCC)    Chronic leg pain    right   Chronic lower back pain    Coronary artery disease    a. Stenting to RCA 2004; staged DES to LAD and Cx 2004. DES to [facility] 2012. b. DES to mCx, PTCA to dCx 11/2011. c. Lateral wall MI 2013 s/p PTCA to distal Cx & DES to mid OM2 11/2011. d. Low risk nuc 04/2014, EF wnl.   COVID-19    Diabetes mellitus    Insulin  dependent   Diabetic neuropathy (HCC)    MILD   Diverticulosis    Dysrhythmia    Bertrum syndrome    Gout    right wrist; right foot; right elbow; have had it since 1970's   H/O hiatal hernia    Heart murmur    History of echocardiogram    aortic sclerosis per echo 12/09 EF 65%, otherwise normal   History of hemorrhoids    BLEEDING   History of kidney stones    h/o   Hypertension    Diagnosed 1995    Myocardial infarction [facility])    Pancreatic pseudocyst    a. s/p remote drainage 2006.   Thrombocytopenia    Seen on oldest labs in system from 2004   Vitamin B 12 deficiency    orally replaced   Past Surgical History:  Past Surgical History:  Procedure Laterality Date   ABDOMINAL AORTOGRAM W/LOWER EXTREMITY Bilateral 08/08/2020   Procedure: ABDOMINAL AORTOGRAM W/LOWER EXTREMITY;  Surgeon: Eliza Lonni RAMAN, MD;  Location: [facility] INVASIVE CV LAB;  Service: Cardiovascular;  Laterality: Bilateral;   AMPUTATION Right 12/12/2020   Procedure: RIGHT BELOW KNEE AMPUTATION;  Surgeon: Harden Jerona GAILS, MD;  Location: [facility] OR;  Service:  Orthopedics;  Laterality: Right;   BACK SURGERY     total of 3 times S/P fall    CARPAL TUNNEL RELEASE Bilateral    CHOLECYSTECTOMY  1990's   CIRCUMCISION N/A 10/12/2023   Procedure: Dorsal slit;  Surgeon: [name], MD;  Location: [facility] ORS;  Service: Urology;  Laterality: N/A;   COLONOSCOPY     COLONOSCOPY Left 07/08/2023   Procedure: COLONOSCOPY;  Surgeon: San Sandor GAILS, DO;  Location: [facility] ENDOSCOPY;  Service: Gastroenterology;  Laterality: Left;   CORONARY ANGIOPLASTY  11/11/11   CORONARY ANGIOPLASTY WITH STENT PLACEMENT  09/30/2011   1 then; makes a total of 4   CORONARY ANGIOPLASTY WITH STENT PLACEMENT  11/11/11   1; makes a total of 5   ESOPHAGOGASTRODUODENOSCOPY Left 07/08/2023   Procedure: ESOPHAGOGASTRODUODENOSCOPY (EGD);  Surgeon: San Sandor GAILS, DO;  Location: [facility] ENDOSCOPY;  Service: Gastroenterology;  Laterality: Left;   HEMOSTASIS CLIP PLACEMENT  07/08/2023   Procedure: HEMOSTASIS CLIP PLACEMENT;  Surgeon: San Sandor GAILS, DO;  Location: [facility] ENDOSCOPY;  Service: Gastroenterology;;   HOT HEMOSTASIS N/A 07/08/2023   Procedure: HOT HEMOSTASIS (ARGON PLASMA COAGULATION/BICAP);  Surgeon: San Sandor GAILS, DO;  Location: [facility] ENDOSCOPY;  Service: Gastroenterology;  Laterality: N/A;  INGUINAL HERNIA REPAIR  2003   right   JOINT REPLACEMENT Right 04/03/2002   hip replacment   KNEE ARTHROSCOPY  1990's   left   LEFT HEART CATH AND CORONARY ANGIOGRAPHY N/A 04/25/2023   Procedure: LEFT HEART CATH AND CORONARY ANGIOGRAPHY;  Surgeon: Anner Alm ORN, MD;  Location: [facility] INVASIVE CV LAB;  Service: Cardiovascular;  Laterality: N/A;   LEFT HEART CATHETERIZATION WITH CORONARY ANGIOGRAM N/A 09/30/2011   Procedure: LEFT HEART CATHETERIZATION WITH CORONARY ANGIOGRAM;  Surgeon: Candyce GORMAN Reek, MD;  Location: [facility] CATH LAB;  Service: Cardiovascular;  Laterality: N/A;  possible PCI   LEFT HEART CATHETERIZATION WITH CORONARY ANGIOGRAM N/A 11/15/2011   Procedure: LEFT HEART  CATHETERIZATION WITH CORONARY ANGIOGRAM;  Surgeon: Candyce GORMAN Reek, MD;  Location: [facility] CATH LAB;  Service: Cardiovascular;  Laterality: N/A;   PERCUTANEOUS CORONARY STENT INTERVENTION (PCI-S)  09/30/2011   Procedure: PERCUTANEOUS CORONARY STENT INTERVENTION (PCI-S);  Surgeon: Candyce GORMAN Reek, MD;  Location: [facility] CATH LAB;  Service: Cardiovascular;;   PERCUTANEOUS CORONARY STENT INTERVENTION (PCI-S) N/A 11/11/2011   Procedure: PERCUTANEOUS CORONARY STENT INTERVENTION (PCI-S);  Surgeon: Candyce GORMAN Reek, MD;  Location: [facility] CATH LAB;  Service: Cardiovascular;  Laterality: N/A;   PERIPHERAL VASCULAR BALLOON ANGIOPLASTY Right 08/08/2020   Procedure: PERIPHERAL VASCULAR BALLOON ANGIOPLASTY;  Surgeon: Eliza Lonni GORMAN, MD;  Location: [facility] INVASIVE CV LAB;  Service: Cardiovascular;  Laterality: Right;  Posterior tibial    SHOULDER SURGERY Right    X 2   STUMP REVISION Right 01/09/2021   Procedure: REVISION RIGHT BELOW KNEE AMPUTATION;  Surgeon: Harden Jerona GAILS, MD;  Location: [facility] OR;  Service: Orthopedics;  Laterality: Right;   TONSILLECTOMY  ~ 1948   TOTAL HIP REVISION Right 04/13/2019   Procedure: RIGHT TOTAL HIP REVISION-POSTERIOR  APPROACH LATERAL;  Surgeon: Barbarann Oneil BROCKS, MD;  Location: [facility] OR;  Service: Orthopedics;  Laterality: Right;   TOTAL KNEE ARTHROPLASTY Left 07/23/2016   Procedure: LEFT TOTAL KNEE ARTHROPLASTY;  Surgeon: Oneil BROCKS Barbarann, MD;  Location: [facility] OR;  Service: Orthopedics;  Laterality: Left;   HPI:  Keith Espinoza is an 83 y.o. male who presented to the ED on 09/15/24 for evaluation of cough interfering with his sleep as well as ongoing poor appetite. He had been recently at ED on 12/7 for abdominal pain with workup essentially unremarkable and he was discharged with Protonix  and Carafate . Family reported that since that ED visit, his appetite has not improved and he is not drinking enough liquids/fluids. Patient himself reported getting choked or coughing after liquids at times. CXR  showed right LL opacity not visualized on CT chest and therefore likely consolidation/infection. SLP swallow evaluation ordered secondary to ?aspiration PNA. He was admitted with CAP, AKI, hypotension and lactic acidosis. PMH: ongoing chewing tobacco use (he states for past 50 years), IDDM, CHF, chronic low back pain, arthritis, hiatal hernia, HTN, MI, Gilbert syndrome, GERD, thrombocytopenia.    Assessment / Plan / Recommendation  Clinical Impression  Patient did not present with overt clinical s/s of aspiration as per this bedside swallow study but unable to r/o silent aspiration at bedside. Patient himself indicated that he has noticed some changes in his swallowing the past two years and he now has to take sips rather than drinking liquids rapidly. SLP assessed his swallow at bedside as he drank consectutive straw sips of thin liquids (water). Swallow initiation appeared timely and no overt s/s aspiration observed during or after PO intake. Voice remained clear. SLP recommending he continue on current diet  and suspect that his swallow is at baseline. SLP will discuss further with MD to determine if MBS needed to r/o silent aspiration, although patient did indicate he would not want to have a swallow test. SLP Visit Diagnosis: Dysphagia, unspecified (R13.10)    Aspiration Risk  Mild aspiration risk    Diet Recommendation Regular;Thin liquid    Liquid Administration via: Cup;Straw Medication Administration: Other (Comment) (as tolerated) Compensations: Slow rate;Small sips/bites Postural Changes: Seated upright at 90 degrees;Remain upright for at least 30 minutes after po intake    Other Recommendations Oral Care Recommendations: Oral care BID     Swallow Evaluation Recommendations     Assistance Recommended at Discharge    Functional Status Assessment Patient has had a recent decline in their functional status and demonstrates the ability to make significant improvements in function in a  reasonable and predictable amount of time.  Frequency and Duration min 1 x/week  1 week       Prognosis Prognosis for improved oropharyngeal function: Good      Swallow Study   General Date of Onset: 09/15/24 HPI: Gussie Towson is an 83 y.o. male who presented to the ED on 09/15/24 for evaluation of cough interfering with his sleep as well as ongoing poor appetite. He had been recently at ED on 12/7 for abdominal pain with workup essentially unremarkable and he was discharged with Protonix  and Carafate . Family reported that since that ED visit, his appetite has not improved and he is not drinking enough liquids/fluids. Patient himself reported getting choked or coughing after liquids at times. CXR showed right LL opacity not visualized on CT chest and therefore likely consolidation/infection. SLP swallow evaluation ordered secondary to ?aspiration PNA. He was admitted with CAP, AKI, hypotension and lactic acidosis. PMH: ongoing chewing tobacco use (he states for past 50 years), IDDM, CHF, chronic low back pain, arthritis, hiatal hernia, HTN, MI, Gilbert syndrome, GERD, thrombocytopenia. Type of Study: Bedside Swallow Evaluation Previous Swallow Assessment: none found Diet Prior to this Study: Regular;Thin liquids (Level 0) Temperature Spikes Noted: No Respiratory Status: Room air History of Recent Intubation: No Behavior/Cognition: Alert;Cooperative;Pleasant mood Oral Cavity Assessment: Within Functional Limits Oral Care Completed by SLP: No Oral Cavity - Dentition: Adequate natural dentition Vision: Functional for self-feeding Self-Feeding Abilities: Able to feed self Patient Positioning: Other (comment) (sitting upright, edge of bed) Baseline Vocal Quality: Normal Volitional Cough: Strong Volitional Swallow: Able to elicit    Oral/Motor/Sensory Function Overall Oral Motor/Sensory Function: Within functional limits   Ice Chips     Thin Liquid Thin Liquid: Within functional  limits Presentation: Straw;Self Fed    Nectar Thick     Honey Thick     Puree Puree: Not tested   Solid     Solid: Not tested      [name], MA, CCC-SLP Speech Therapy  09/16/2024,9:47 AM

## 2024-09-17 ENCOUNTER — Inpatient Hospital Stay (HOSPITAL_COMMUNITY)

## 2024-09-17 DIAGNOSIS — J189 Pneumonia, unspecified organism: Secondary | ICD-10-CM | POA: Diagnosis not present

## 2024-09-17 LAB — BASIC METABOLIC PANEL WITH GFR
Anion gap: 15 (ref 5–15)
BUN: 31 mg/dL — ABNORMAL HIGH (ref 8–23)
CO2: 20 mmol/L — ABNORMAL LOW (ref 22–32)
Calcium: 9.1 mg/dL (ref 8.9–10.3)
Chloride: 99 mmol/L (ref 98–111)
Creatinine, Ser: 2.37 mg/dL — ABNORMAL HIGH (ref 0.61–1.24)
GFR, Estimated: 26 mL/min — ABNORMAL LOW (ref >60)
Glucose, Bld: 174 mg/dL — ABNORMAL HIGH (ref 70–99)
Potassium: 4.8 mmol/L (ref 3.5–5.1)
Sodium: 135 mmol/L (ref 135–145)

## 2024-09-17 LAB — GLUCOSE, CAPILLARY
Glucose-Capillary: 194 mg/dL — ABNORMAL HIGH (ref 70–99)
Glucose-Capillary: 247 mg/dL — ABNORMAL HIGH (ref 70–99)
Glucose-Capillary: 154 mg/dL — ABNORMAL HIGH (ref 70–99)
Glucose-Capillary: 162 mg/dL — ABNORMAL HIGH (ref 70–99)

## 2024-09-17 LAB — CBC
HCT: 31.9 % — ABNORMAL LOW (ref 39.0–52.0)
Hemoglobin: 10 g/dL — ABNORMAL LOW (ref 13.0–17.0)
MCH: 31.4 pg (ref 26.0–34.0)
MCHC: 31.3 g/dL (ref 30.0–36.0)
MCV: 100.3 fL — ABNORMAL HIGH (ref 80.0–100.0)
Platelets: 79 K/uL — ABNORMAL LOW (ref 150–400)
RBC: 3.18 MIL/uL — ABNORMAL LOW (ref 4.22–5.81)
RDW: 15.7 % — ABNORMAL HIGH (ref 11.5–15.5)
WBC: 6.1 K/uL (ref 4.0–10.5)
nRBC: 0 % (ref 0.0–0.2)

## 2024-09-17 MED ORDER — LACTATED RINGERS IV SOLN: INTRAVENOUS | Status: DC

## 2024-09-17 MED ORDER — SUCRALFATE 1 GM/10ML PO SUSP
1.0000 g | Freq: Two times a day (BID) | ORAL | Status: DC
  Administered 2024-09-17 – 2024-09-19 (×3): 1 g via ORAL
  Filled 2024-09-17 (×4): qty 10

## 2024-09-17 MED ORDER — AZITHROMYCIN 500 MG PO TABS
500.0000 mg | ORAL_TABLET | Freq: Every day | ORAL | Status: DC
  Administered 2024-09-17 – 2024-09-18 (×2): 500 mg via ORAL
  Filled 2024-09-17 (×2): qty 1

## 2024-09-17 MED ORDER — DICYCLOMINE HCL 10 MG PO CAPS
10.0000 mg | ORAL_CAPSULE | Freq: Three times a day (TID) | ORAL | Status: DC | PRN
  Administered 2024-09-17: 14:00:00 10 mg via ORAL
  Filled 2024-09-17: qty 1

## 2024-09-17 NOTE — TOC Initial Note (Signed)
 Transition of Care [facility]) - Initial/Assessment Note    Patient Details  Name: Keith Espinoza MRN: [id number] Date of Birth: [birth year]  Transition of Care [facility]) CM/SW Contact:    Bascom Service, RN Phone Number: 09/17/2024, 9:45 AM  Clinical Narrative: Spoke to [name] (dtr) on phone about d/c plans. Referral for transportation-[name] lives 1hr away but will pick patient up @ d/c-provided her tel# to floor to coordinate d/c date & time.                  Expected Discharge Plan: Home/Self Care Barriers to Discharge: Continued Medical Work up   Patient Goals and CMS Choice Patient states their goals for this hospitalization and ongoing recovery are:: Home CMS Medicare.gov Compare Post Acute Care list provided to:: Patient Represenative (must comment) ([name](dtr)) Choice offered to / list presented to : Adult Children [place] ownership interest in [facility].provided to:: Adult Children    Expected Discharge Plan and Services   Discharge Planning Services: CM Consult   Living arrangements for the past 2 months: Single Family Home                                      Prior Living Arrangements/Services Living arrangements for the past 2 months: Single Family Home Lives with:: Self   Do you feel safe going back to the place where you live?: Yes               Activities of Daily Living   ADL Screening (condition at time of admission) Independently performs ADLs?: Yes (appropriate for developmental age) Is the patient deaf or have difficulty hearing?: No Does the patient have difficulty seeing, even when wearing glasses/contacts?: Yes Does the patient have difficulty concentrating, remembering, or making decisions?: No  Permission Sought/Granted Permission sought to share information with : Case Manager                Emotional Assessment              Admission diagnosis:  Severe sepsis (HCC) [A41.9, R65.20] Pneumonia of right lower  lobe due to infectious organism [J18.9] Right lower lobe pneumonia [J18.9] Patient Active Problem List   Diagnosis Date Noted   Severe sepsis (HCC) 09/16/2024   Right lower lobe pneumonia 09/15/2024   Lactic acidosis 09/15/2024   Abdominal pain 09/15/2024   Lung nodules 05/15/2024   CKD stage 3a, GFR 45-59 ml/min (HCC) 05/15/2024   Palliative care by specialist 05/15/2024   Goals of care, counseling/discussion 05/15/2024   Lightheadedness 05/14/2024   Chronic heart failure with mildly reduced ejection fraction (HFmrEF, 41-49%) (HCC) 03/02/2024   Acute on chronic combined systolic and diastolic HF (heart failure) (HCC) 07/09/2023   Gastric AVM 07/08/2023   Diverticulosis of colon without hemorrhage 07/08/2023   Gastrointestinal hemorrhage associated with acute gastritis 07/08/2023   PSVT (paroxysmal supraventricular tachycardia) 07/08/2023   Paroxysmal A-fib (HCC) 07/08/2023   Grade I diastolic dysfunction 07/08/2023   Acute on chronic anemia 07/07/2023   Hypotension 07/07/2023   New onset atrial fibrillation (HCC) 07/07/2023   Ischemic cardiomyopathy 07/07/2023   Acute systolic CHF (congestive heart failure) (HCC) 07/07/2023   Pressure injury of skin 07/07/2023   Blister 07/06/2023   Edema of both lower extremities 07/05/2023   CHF exacerbation (HCC) 07/05/2023   Iron  deficiency anemia 07/05/2023   Shortness of breath 07/03/2023   Finger numbness 05/05/2023   Elevated  troponin 04/25/2023   Chest pain of uncertain etiology 04/23/2023   Unstable angina (HCC) 04/23/2023   AKI (acute kidney injury) 04/23/2023   History of CAD (coronary artery disease) 04/23/2023   Contusion of right hip 03/08/2023   Type 2 diabetes mellitus with hyperglycemia, with long-term current use of insulin  (HCC) 02/15/2022   Adenoma of left adrenal gland 02/15/2022   History of right below knee amputation (HCC) 10/06/2021   Exudative age-related macular degeneration of left eye with active choroidal  neovascularization (HCC) 07/28/2021   Exudative age-related macular degeneration of right eye with active choroidal neovascularization (HCC) 06/01/2021   Intermediate stage nonexudative age-related macular degeneration of both eyes 06/01/2021   Type 2 diabetes mellitus with diabetic polyneuropathy, with long-term current use of insulin  (HCC) 03/24/2021   Constipation 02/02/2021   Fecal impaction (HCC) 02/01/2021   Wound dehiscence 01/09/2021   Dehiscence of amputation stump (HCC)    Status post percutaneous transluminal coronary angioplasty 01/06/2021   Type II diabetes mellitus, uncontrolled 01/06/2021   Acute pancreatitis 01/06/2021   Diabetic peripheral vascular disease (HCC) 01/06/2021   Encounter for screening for other disorder 01/06/2021   Enlarged prostate 01/06/2021   Foot ulcer, right (HCC) 01/06/2021   Gout 01/06/2021   Headache 01/06/2021   Neck pain 01/06/2021   Hypoglycemia 01/06/2021   Loss of appetite 01/06/2021   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   Peripheral neuropathy 01/06/2021   Sciatica 01/06/2021   Vitamin B12 deficiency 01/06/2021   Vitamin D  deficiency 01/06/2021   Weakness 01/06/2021   Diabetes mellitus type 2 with neurological manifestations (HCC) 01/06/2021   Protein-calorie malnutrition, severe 12/26/2020   Acute blood loss anemia 12/26/2020   Prerenal azotemia 12/26/2020   Below-knee amputation of right lower extremity (HCC) 12/12/2020   Gangrene of right foot (HCC)    Diabetic neuropathy (HCC) 11/17/2020   Hyperglycemia due to type 2 diabetes mellitus (HCC) 11/17/2020   Long term (current) use of insulin  (HCC) 11/17/2020   Obesity 11/17/2020   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/2019   Hip dislocation, right (HCC) 04/13/2019   Other intervertebral disc degeneration, lumbar region 03/30/2019   Coronary artery disease 01/30/2019   Tobacco abuse 01/30/2019   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   Burn, foot, second degree, left,  initial encounter 06/08/2017   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 03/16/2017   S/P total knee arthroplasty, left 10/26/2016   Hyperlipidemia 09/04/2014   Thrombocytopenia    Precordial chest pain 04/05/2014   Coronary atherosclerosis of native coronary artery 10/01/2013   Other and unspecified hyperlipidemia 10/01/2013   Hypertension 10/01/2013   Type 2 diabetes mellitus with complication, without long-term current use of insulin  (HCC) 10/01/2013   Esophageal reflux 10/01/2013   Hypertrophy of prostate without urinary obstruction and other lower urinary tract symptoms (LUTS) 10/01/2013   PCP:  Charlott Dorn LABOR, MD Pharmacy:   [facility] [phone] - [place], [place] - [address] [place] [address] Phone: [phone] Fax: [phone]  [email or web address] - [place], [place] - [phone] [facility] [address] [facility] [address] [place] [address] Phone: [phone] Fax: [phone]     Social Drivers of Health (SDOH) Social History: SDOH Screenings   Food Insecurity: No Food Insecurity (09/15/2024)  Housing: Low Risk (09/15/2024)  Transportation Needs: No Transportation Needs (09/15/2024)  Utilities: Not At Risk (09/15/2024)  Depression (PHQ2-9): Low Risk (04/21/2022)  Financial Resource Strain: Low Risk (05/16/2023)   [facility] from [facility]  Social Connections: Socially Isolated (09/15/2024)  Stress:  No Stress Concern Present (06/12/2023)   [facility]  Tobacco Use: High Risk (09/15/2024)   SDOH Interventions:     Readmission Risk Interventions     No data to display

## 2024-09-17 NOTE — Progress Notes (Addendum)
 PROGRESS NOTE    De Jaworski  FMW:[id number] DOB: 02-Sep-1941 DOA: 09/15/2024 PCP: Charlott Dorn LABOR, MD   Brief Narrative: 83 year old with past medical history significant for CAD status post stents, chronic heart failure reduced ejection fraction, hypertension, hyperlipidemia, diabetes type 2 history of right BKA, left femur fracture nonweightbearing for 26-month with subsequent debility and weakness, CKD stage IIIa, tobacco abuse presents for further evaluation of cough, ongoing poor appetite.  Of note patient had been seen in the ED 12/7 for abdominal pain workup was unremarkable and he was discharged on Protonix  and Carafate .  He still have intermittent abdominal pain.  No urinary symptoms.  Evaluation in the ED creatinine 2.0, AST 92, hemoglobin 9.8, lactic acid 3.8 improved to 2.5 after IV fluids.  Viral PCR and COVID and flu negative.  Patient blood pressure was soft 86/70.  Received IV fluids.  Chest x-ray showed right lower lobe opacity described as 1.5 cm spiculated that was not visualized on CT chest 12/7.  Patient has been admitted for management of pneumonia, AKI, hypotension and lactic acidosis.  Assessment & Plan:   Principal Problem:   Right lower lobe pneumonia Active Problems:   Hypotension   Lactic acidosis   Weakness   Type 2 diabetes mellitus with hyperglycemia, with long-term current use of insulin  (HCC)   AKI (acute kidney injury)   Chronic heart failure with mildly reduced ejection fraction (HFmrEF, 41-49%) (HCC)   Abdominal pain   Hypertension   Hyperlipidemia   Coronary artery disease   Tobacco abuse   Enlarged prostate   Peripheral neuropathy   CKD stage 3a, GFR 45-59 ml/min (HCC)   Thrombocytopenia   Paroxysmal A-fib (HCC)   Severe sepsis (HCC)  1-Right lower lobe pneumonia: Positive Rhino-Virus.  - Patient presented with cough, poor oral intake and weakness.  Chest x-ray showed right lower lung nodular like 1.5 cm spiculated airspace  opacity.  Concern for infection not seen on previous CT.  Needs follow-up x-ray in 3 to 4 weeks to ensure resolution. - Continue IV ceftriaxone  and azithromycin  - Continue Mucinex   Lactic acidosis: - In the setting of him showing trending down.  Also CKD  Hypotension: - Improved with IV fluid - Hold Jardiance  and Lasix  due to soft blood pressure.  Abdominal pain: - Patient was seen in the ED 12/7 for abdominal pain, CT abdomen and pelvis was unremarkable. -He was discharged on Protonix  and Carafate .   PPI to twice daily. -will add bentyl  PRN.   Chronic Systolic heart failure mildly reduced ejection fraction 41 to 49% - Plan to hold Lasix  due to soft blood pressure in the setting of infection.  AKI on CKD stage III A -Creatinine on admission 2.0, previous creatinine 1.6--1.4 -Renal ultrasound: No hydronephrosis. Increased echogenicity of the kidneys as can be seen in medical renal disease. Non obstructing right nephrolithiasis. -chest x ray clear, will start low rate IV fluids.   Diabetes type 2 with hyperglycemia long-term current use of insulin  - Continue to hold metformin   - Continue sliding scale insulin   Weakness, failure to thrive - Encourage oral intake.  Treat for pneumonia.  PT OT consult  Peripheral neuropathy Resume gabapentin  pending med   Enlarged prostate On terazosin    Tobacco abuse Cessation encouraged.   Coronary artery disease --ASA/Plavix , statin Holding aspirin  and Plavix  due to thrombocytopenia hopefully we can resume tomorrow  Hyperlipidemia On statin   Hypertension See hypotension. Hold antihypertensives.   Paroxysmal A-fib (HCC) --Appears on ASA/Plavix , not anticoagulation Plan to hold  aspirin  and Plavix  due to thrombocytopenia   Thrombocytopenia Chronic. Slightly below baseline at 83k on admission.    Platelet level baseline 113 130s     Estimated body mass index is 21.26 kg/m as calculated from the following:   Height as of this  encounter: 5' 10 (1.778 m).   Weight as of this encounter: 67.2 kg.   DVT prophylaxis: SCDs Code Status: Full code Family Communication: Daughter who was at bedside 12/15 Disposition Plan:  Status is: Observation The patient will require care spanning > 2 midnights and should be moved to inpatient because: Management of pneumonia and AKI    Consultants:  None  Procedures:  Renal ultrasound  Antimicrobials:    Subjective: He is alert, report feeling better today. Had transient abdominal pain after drinking water.    Objective: Vitals:   09/16/24 1630 09/16/24 1745 09/16/24 2040 09/17/24 0419  BP: 105/72 110/84 102/69 109/71  Pulse: 80 89 81 82  Resp:   17 18  Temp:   (!) 97.3 F (36.3 C) 97.9 F (36.6 C)  TempSrc:   Oral Oral  SpO2:   99% 99%  Weight:      Height:        Intake/Output Summary (Last 24 hours) at 09/17/2024 1222 Last data filed at 09/17/2024 0420 Gross per 24 hour  Intake 743.58 ml  Output 1175 ml  Net -431.42 ml   Filed Weights   09/15/24 1630  Weight: 67.2 kg    Examination:  General exam: NAD Respiratory system: [place] left side Cardiovascular system:S 1, S 2  RRR Gastrointestinal system:BS present, soft, nt Central nervous system: alert, follows command Extremities: right BKA   Data Reviewed: I have personally reviewed following labs and imaging studies  CBC: Recent Labs  Lab 09/15/24 1249 09/16/24 0159 09/17/24 0421  WBC 6.0 5.8 6.1  NEUTROABS 4.4  --   --   HGB 9.8* 9.6* 10.0*  HCT 30.6* 31.0* 31.9*  MCV 99.4 101.0* 100.3*  PLT 83* 85* 79*   Basic Metabolic Panel: Recent Labs  Lab 09/15/24 1249 09/16/24 0159 09/17/24 0421  NA 139 137 135  K 4.4 5.1 4.8  CL 101 101 99  CO2 26 23 20*  GLUCOSE 208* 200* 174*  BUN 27* 29* 31*  CREATININE 2.05* 2.11* 2.37*  CALCIUM  9.9 8.8* 9.1   GFR: Estimated Creatinine Clearance: 22.1 mL/min (A) (by C-G formula based on SCr of 2.37 mg/dL (H)). Liver Function  Tests: Recent Labs  Lab 09/15/24 1249  AST 92*  ALT 43  ALKPHOS 78  BILITOT 0.7  PROT 6.3*  ALBUMIN  3.7   No results for input(s): LIPASE, AMYLASE in the last 168 hours.  No results for input(s): AMMONIA in the last 168 hours. Coagulation Profile: No results for input(s): INR, PROTIME in the last 168 hours. Cardiac Enzymes: No results for input(s): CKTOTAL, CKMB, CKMBINDEX, TROPONINI in the last 168 hours. BNP (last 3 results) Recent Labs    05/14/24 1416  PROBNP 1,468.0*   HbA1C: No results for input(s): HGBA1C in the last 72 hours. CBG: Recent Labs  Lab 09/16/24 1133 09/16/24 1651 09/16/24 2040 09/17/24 0727 09/17/24 1141  GLUCAP 215* 216* 181* 194* 247*   Lipid Profile: No results for input(s): CHOL, HDL, LDLCALC, TRIG, CHOLHDL, LDLDIRECT in the last 72 hours. Thyroid  Function Tests: No results for input(s): TSH, T4TOTAL, FREET4, T3FREE, THYROIDAB in the last 72 hours. Anemia Panel: No results for input(s): VITAMINB12, FOLATE, FERRITIN, TIBC, IRON , RETICCTPCT in the last 72  hours. Sepsis Labs: Recent Labs  Lab 09/15/24 1447 09/15/24 1856 09/16/24 0159 09/16/24 0541  PROCALCITON  --  <0.10 <0.10  --   LATICACIDVEN 2.5* 2.7* 2.6* 2.0*    Recent Results (from the past 240 hours)  Blood Culture (routine x 2)     Status: None (Preliminary result)   Collection Time: 09/15/24 12:49 PM   Specimen: BLOOD  Result Value Ref Range Status   Specimen Description   Final    BLOOD LEFT ANTECUBITAL Performed at [facility], [address] [place], [place] [address]    Special Requests   Final    BOTTLES DRAWN AEROBIC AND ANAEROBIC Blood Culture adequate volume Performed at [facility], [address], [place], [place] [address]    Culture   Final    NO GROWTH 2 DAYS Performed at [facility], [address], [place], [place] [address]    Report Status PENDING   Incomplete  Blood Culture (routine x 2)     Status: None (Preliminary result)   Collection Time: 09/15/24 12:49 PM   Specimen: BLOOD  Result Value Ref Range Status   Specimen Description   Final    BLOOD RIGHT ANTECUBITAL Performed at [facility], [address], [place], [place] [address]    Special Requests   Final    BOTTLES DRAWN AEROBIC AND ANAEROBIC Blood Culture adequate volume Performed at [facility], [address] [place], [place] [address]    Culture   Final    NO GROWTH 2 DAYS Performed at [facility], [address], [place], [place] [address]    Report Status PENDING  Incomplete  Resp panel by RT-PCR (RSV, Flu A&B, Covid) Peripheral     Status: None   Collection Time: 09/15/24 12:51 PM   Specimen: Peripheral; Nasal Swab  Result Value Ref Range Status   SARS Coronavirus 2 by RT PCR NEGATIVE NEGATIVE Final    Comment: (NOTE) SARS-CoV-2 target nucleic acids are NOT DETECTED.  The SARS-CoV-2 RNA is generally detectable in upper respiratory specimens during the acute phase of infection. The lowest concentration of SARS-CoV-2 viral copies this assay can detect is 138 copies/mL. A negative result does not preclude SARS-Cov-2 infection and should not be used as the sole basis for treatment or other patient management decisions. A negative result may occur with  improper specimen collection/handling, submission of specimen other than nasopharyngeal swab, presence of viral mutation(s) within the areas targeted by this assay, and inadequate number of viral copies(<138 copies/mL). A negative result must be combined with clinical observations, patient history, and epidemiological information. The expected result is Negative.  Fact Sheet for Patients:  bloggercourse.com  Fact Sheet for Healthcare Providers:  seriousbroker.it  This test is no t yet approved or cleared by the  [place]  FDA and  has been authorized for detection and/or diagnosis of SARS-CoV-2 by FDA under an Emergency Use Authorization (EUA). This EUA will remain  in effect (meaning this test can be used) for the duration of the COVID-19 declaration under Section 564(b)(1) of the Act, 21 U.S.C.section 360bbb-3(b)(1), unless the authorization is terminated  or revoked sooner.       Influenza A by PCR NEGATIVE NEGATIVE Final   Influenza B by PCR NEGATIVE NEGATIVE Final    Comment: (NOTE) The Xpert Xpress SARS-CoV-2/FLU/RSV plus assay is intended as an aid in the diagnosis of influenza from Nasopharyngeal swab specimens and should not be used as a sole basis for  treatment. Nasal washings and aspirates are unacceptable for Xpert Xpress SARS-CoV-2/FLU/RSV testing.  Fact Sheet for Patients: bloggercourse.com  Fact Sheet for Healthcare Providers: seriousbroker.it  This test is not yet approved or cleared by the [place]  FDA and has been authorized for detection and/or diagnosis of SARS-CoV-2 by FDA under an Emergency Use Authorization (EUA). This EUA will remain in effect (meaning this test can be used) for the duration of the COVID-19 declaration under Section 564(b)(1) of the Act, 21 U.S.C. section 360bbb-3(b)(1), unless the authorization is terminated or revoked.     Resp Syncytial Virus by PCR NEGATIVE NEGATIVE Final    Comment: (NOTE) Fact Sheet for Patients: bloggercourse.com  Fact Sheet for Healthcare Providers: seriousbroker.it  This test is not yet approved or cleared by the [place]  FDA and has been authorized for detection and/or diagnosis of SARS-CoV-2 by FDA under an Emergency Use Authorization (EUA). This EUA will remain in effect (meaning this test can be used) for the duration of the COVID-19 declaration under Section 564(b)(1) of the Act, 21 U.S.C. section  360bbb-3(b)(1), unless the authorization is terminated or revoked.  Performed at [facility], [address], [place], [place] [address]   Respiratory (~20 pathogens) panel by PCR     Status: Abnormal   Collection Time: 09/15/24 10:25 PM   Specimen: Nasopharyngeal Swab; Respiratory  Result Value Ref Range Status   Adenovirus NOT DETECTED NOT DETECTED Final   Coronavirus 229E NOT DETECTED NOT DETECTED Final    Comment: (NOTE) The Coronavirus on the Respiratory Panel, DOES NOT test for the novel  Coronavirus (2019 nCoV)    Coronavirus HKU1 NOT DETECTED NOT DETECTED Final   Coronavirus NL63 NOT DETECTED NOT DETECTED Final   Coronavirus OC43 NOT DETECTED NOT DETECTED Final   Metapneumovirus NOT DETECTED NOT DETECTED Final   Rhinovirus / Enterovirus DETECTED (A) NOT DETECTED Final   Influenza A NOT DETECTED NOT DETECTED Final   Influenza B NOT DETECTED NOT DETECTED Final   Parainfluenza Virus 1 NOT DETECTED NOT DETECTED Final   Parainfluenza Virus 2 NOT DETECTED NOT DETECTED Final   Parainfluenza Virus 3 NOT DETECTED NOT DETECTED Final   Parainfluenza Virus 4 NOT DETECTED NOT DETECTED Final   Respiratory Syncytial Virus NOT DETECTED NOT DETECTED Final   Bordetella pertussis NOT DETECTED NOT DETECTED Final   Bordetella Parapertussis NOT DETECTED NOT DETECTED Final   Chlamydophila pneumoniae NOT DETECTED NOT DETECTED Final   Mycoplasma pneumoniae NOT DETECTED NOT DETECTED Final    Comment: Performed at [facility], [address] [place], [place] [address]         Radiology Studies: DG CHEST PORT 1 VIEW Result Date: 09/17/2024 CLINICAL DATA:  Acute renal insufficiency. EXAM: PORTABLE CHEST 1 VIEW COMPARISON:  Chest radiograph dated 09/15/2024. FINDINGS: Mild eventration of the right hemidiaphragm with minimal right lung base atelectasis. X-rays right pleural effusion may be present. No pneumothorax. The cardiac silhouette is within normal limits. No  acute osseous pathology. IMPRESSION: Minimal right lung base atelectasis. No focal consolidation. Electronically Signed   By: Vanetta Chou M.D.   On: 09/17/2024 08:59   US  RENAL Result Date: 09/16/2024 CLINICAL DATA:  [id number] AKI (acute kidney injury) [id number] EXAM: RENAL / URINARY TRACT ULTRASOUND COMPLETE COMPARISON:  September 09, 2024 FINDINGS: Right Kidney: Renal measurements: 10.7 x 5.2 x 5.4 cm = volume: 156 mL. Echogenicity is increased. No mass or hydronephrosis visualized. 11 mm echogenic focus with twinkle artifact consistent with a nonobstructing nephrolithiasis. Portions are suboptimally  assessed secondary to shadowing bowel gas. Left Kidney: Renal measurements: 9.6 x 5.4 x 3.3 cm = volume: 88 mL. Echogenicity is increased. No mass or hydronephrosis visualized. Portions are suboptimally assessed secondary to shadowing bowel gas. Bladder: Decompressed with mild circumferential wall prominence likely reflecting sequela of chronic outlet obstruction. Other: Small volume ascites. IMPRESSION: 1. No hydronephrosis. 2. Increased echogenicity of the kidneys as can be seen in medical renal disease. 3. Nonobstructing right nephrolithiasis. 4. Small volume ascites. Electronically Signed   By: Corean Salter M.D.   On: 09/16/2024 14:41   DG Chest Port 1 View Result Date: 09/15/2024 EXAM: 1 VIEW(S) XRAY OF THE CHEST 09/15/2024 12:40:23 PM COMPARISON: 07/03/2023 CLINICAL HISTORY: Questionable sepsis - evaluate for abnormality FINDINGS: LUNGS AND PLEURA: Low lung volumes with diffuse coarsened and attenuated bronchovascular markings. Right basilar atelectasis. Right lower lung zone nodular like 1.5 cm spiculated airspace opacity. Trace right pleural effusion. No pneumothorax. HEART AND MEDIASTINUM: Aortic atherosclerosis. BONES AND SOFT TISSUES: No acute osseous abnormality. IMPRESSION: 1. Right lower lung zone nodular like 1.5 cm spiculated airspace opacity. Finding not visualized on CT chest 09/09/24 and  likely consolidation/infection. Follow-up PA and lateral chest X-ray is recommended in 3-4 weeks following therapy to ensure resolution. 2. Trace right pleural effusion. Electronically signed by: Morgane Naveau MD 09/15/2024 01:13 PM EST RP Workstation: [id number]        Scheduled Meds:  azithromycin   500 mg Oral Daily   guaiFENesin   600 mg Oral BID   insulin  aspart  0-9 Units Subcutaneous TID WC   pantoprazole  (PROTONIX ) IV  40 mg Intravenous Q12H   rosuvastatin   10 mg Oral Daily   sucralfate   1 g Oral TID WC & HS   terazosin   5 mg Oral BID   Continuous Infusions:  cefTRIAXone  (ROCEPHIN )  IV Stopped (09/16/24 1312)   lactated ringers  75 mL/hr at 09/17/24 0952     LOS: 1 day    Time spent: 35 minutes,     Sentoria Brent A Shaunda Tipping, MD [facility]   If 7PM-7AM, please contact night-coverage [email or web address]  09/17/2024, 12:22 PM

## 2024-09-18 DIAGNOSIS — J189 Pneumonia, unspecified organism: Secondary | ICD-10-CM | POA: Diagnosis not present

## 2024-09-18 LAB — BASIC METABOLIC PANEL WITH GFR
Anion gap: 15 (ref 5–15)
BUN: 32 mg/dL — ABNORMAL HIGH (ref 8–23)
CO2: 20 mmol/L — ABNORMAL LOW (ref 22–32)
Calcium: 9.1 mg/dL (ref 8.9–10.3)
Chloride: 100 mmol/L (ref 98–111)
Creatinine, Ser: 2.29 mg/dL — ABNORMAL HIGH (ref 0.61–1.24)
GFR, Estimated: 27 mL/min — ABNORMAL LOW (ref >60)
Glucose, Bld: 171 mg/dL — ABNORMAL HIGH (ref 70–99)
Potassium: 4.6 mmol/L (ref 3.5–5.1)
Sodium: 135 mmol/L (ref 135–145)

## 2024-09-18 LAB — CBC
HCT: 32 % — ABNORMAL LOW (ref 39.0–52.0)
Hemoglobin: 10.1 g/dL — ABNORMAL LOW (ref 13.0–17.0)
MCH: 31.6 pg (ref 26.0–34.0)
MCHC: 31.6 g/dL (ref 30.0–36.0)
MCV: 100 fL (ref 80.0–100.0)
Platelets: 84 K/uL — ABNORMAL LOW (ref 150–400)
RBC: 3.2 MIL/uL — ABNORMAL LOW (ref 4.22–5.81)
RDW: 15.7 % — ABNORMAL HIGH (ref 11.5–15.5)
WBC: 6.3 K/uL (ref 4.0–10.5)
nRBC: 0 % (ref 0.0–0.2)

## 2024-09-18 LAB — GLUCOSE, CAPILLARY
Glucose-Capillary: 159 mg/dL — ABNORMAL HIGH (ref 70–99)
Glucose-Capillary: 174 mg/dL — ABNORMAL HIGH (ref 70–99)
Glucose-Capillary: 191 mg/dL — ABNORMAL HIGH (ref 70–99)

## 2024-09-18 MED ORDER — GABAPENTIN 300 MG PO CAPS
300.0000 mg | ORAL_CAPSULE | Freq: Every day | ORAL | Status: DC
  Administered 2024-09-19: 300 mg via ORAL
  Filled 2024-09-18: qty 1

## 2024-09-18 MED ORDER — CLOPIDOGREL BISULFATE 75 MG PO TABS
75.0000 mg | ORAL_TABLET | Freq: Every day | ORAL | Status: DC
  Administered 2024-09-19: 09:00:00 75 mg via ORAL
  Filled 2024-09-18 (×2): qty 1

## 2024-09-18 NOTE — Progress Notes (Signed)
 Occupational Therapy Treatment Patient Details Name: Keith Espinoza MRN: [id number] DOB: 19-Nov-1940 Today's Date: 09/18/2024   History of present illness 83 y.o. male presenting 12/13 with cough, congestion, and chills. Found to have CAP, AKI, hypotension, lactic acidosis. PMH: CAD, chronic HFrEF, HTN, HLD, DMII, R BKA, L femur fracture, CKD stage III AA, tobacco abuse   OT comments  Limited participation as pt ready to go home.Modified independent with bed mobility and most likely close to baseline with ADL tasks. SpO2 95 EOB.Recommend HHOT if pt agreeable.       If plan is discharge home, recommend the following:  A little help with walking and/or transfers;A little help with bathing/dressing/bathroom;Assistance with cooking/housework;Assist for transportation;Help with stairs or ramp for entrance   Equipment Recommendations  None recommended by OT    Recommendations for Other Services      Precautions / Restrictions Precautions Recall of Precautions/Restrictions: Intact       Mobility Bed Mobility Overal bed mobility: Modified Independent                  Transfers                   General transfer comment: able to scoot EOB; delicned transferring OOB     Balance Overall balance assessment: Mild deficits observed, not formally tested                                         ADL either performed or assessed with clinical judgement   ADL Overall ADL's : Needs assistance/impaired                                       General ADL Comments: modified independent with bed mobility; declined to donn prosthetic; declined to get dressed and states he can do everything on his own; demosntrates movemetn patterns to indicate he is most likely close to baseline; has difficulty wtih socks, discussed use of a sock aid - pt states he has several at home adn does not like to use them. Attempted to educate pt on strategies to  reduce risk of falls.     Extremity/Trunk Assessment Upper Extremity Assessment Upper Extremity Assessment: Generalized weakness            Vision       Perception     Praxis     Communication     Cognition Arousal: Alert Behavior During Therapy: Agitated Cognition: No apparent impairments (most likely at baseline)                                        Cueing      Exercises Exercises: Other exercises Other Exercises Other Exercises: encouraged use of fluuter valve as he declined to use it at EOB Other Exercises: Has t-band at home, encouraged use once discharged - pt verbalized understanding.    Shoulder Instructions       General Comments      Pertinent Vitals/ Pain       Pain Assessment Pain Assessment: No/denies pain  Home Living  Prior Functioning/Environment              Frequency  Min 2X/week        Progress Toward Goals  OT Goals(current goals can now be found in the care plan section)  Progress towards OT goals: Progressing toward goals  Acute Rehab OT Goals Patient Stated Goal: go home today OT Goal Formulation: With patient Time For Goal Achievement: 09/30/24 Potential to Achieve Goals: Good ADL Goals Pt Will Perform Lower Body Dressing: with modified independence;sit to/from stand Pt Will Transfer to Toilet: with modified independence;squat pivot transfer  Plan      Co-evaluation                 AM-PAC OT 6 Clicks Daily Activity     Outcome Measure   Help from another person eating meals?: None Help from another person taking care of personal grooming?: A Little Help from another person toileting, which includes using toliet, bedpan, or urinal?: A Little Help from another person bathing (including washing, rinsing, drying)?: A Little Help from another person to put on and taking off regular upper body clothing?: A Little Help from another  person to put on and taking off regular lower body clothing?: None 6 Click Score: 20    End of Session    OT Visit Diagnosis: Unsteadiness on feet (R26.81);Muscle weakness (generalized) (M62.81);Other (comment)   Activity Tolerance Other (comment) (self limiting participation)   Patient Left in bed;with call bell/phone within reach;with bed alarm set;with nursing/sitter in room   Nurse Communication Mobility status        Time: 8795-8785 OT Time Calculation (min): 10 min  Charges: OT General Charges $OT Visit: 1 Visit OT Treatments $Self Care/Home Management : 8-22 mins  Kreg Sink, OT/L   Acute OT Clinical Specialist Acute Rehabilitation Services Pager [phone] Office [phone]   [facility] 09/18/2024, 12:30 PM

## 2024-09-18 NOTE — Plan of Care (Signed)

## 2024-09-18 NOTE — Progress Notes (Signed)
 PROGRESS NOTE    Keith Espinoza  FMW:[id number] DOB: 1940/12/07 DOA: 09/15/2024 PCP: Charlott Dorn LABOR, MD   Brief Narrative: 83 year old with past medical history significant for CAD status post stents, chronic heart failure reduced ejection fraction, hypertension, hyperlipidemia, diabetes type 2 history of right BKA, left femur fracture nonweightbearing for 95-month with subsequent debility and weakness, CKD stage IIIa, tobacco abuse presents for further evaluation of cough, ongoing poor appetite.  Of note patient had been seen in the ED 12/7 for abdominal pain workup was unremarkable and he was discharged on Protonix  and Carafate .  He still have intermittent abdominal pain.  No urinary symptoms.  Evaluation in the ED creatinine 2.0, AST 92, hemoglobin 9.8, lactic acid 3.8 improved to 2.5 after IV fluids.  Viral PCR and COVID and flu negative.  Patient blood pressure was soft 86/70.  Received IV fluids.  Chest x-ray showed right lower lobe opacity described as 1.5 cm spiculated that was not visualized on CT chest 12/7.  Patient has been admitted for management of pneumonia, AKI, hypotension and lactic acidosis.  Assessment & Plan:   Principal Problem:   Right lower lobe pneumonia Active Problems:   Hypotension   Lactic acidosis   Weakness   Type 2 diabetes mellitus with hyperglycemia, with long-term current use of insulin  (HCC)   AKI (acute kidney injury)   Chronic heart failure with mildly reduced ejection fraction (HFmrEF, 41-49%) (HCC)   Abdominal pain   Hypertension   Hyperlipidemia   Coronary artery disease   Tobacco abuse   Enlarged prostate   Peripheral neuropathy   CKD stage 3a, GFR 45-59 ml/min (HCC)   Thrombocytopenia   Paroxysmal A-fib (HCC)   Severe sepsis (HCC)  1-Right lower lobe pneumonia: Positive Rhino-Virus.  - Patient presented with cough, poor oral intake and weakness.  Chest x-ray showed right lower lung nodular like 1.5 cm spiculated airspace  opacity.  Concern for infection not seen on previous CT.  Needs follow-up x-ray in 3 to 4 weeks to ensure resolution. - Continue IV ceftriaxone  for 5 days  and completed  azithromycin  - Continue Mucinex   Lactic acidosis: - In the setting of him showing trending down.  Also CKD  Hypotension: - Improved with IV fluid - Hold Jardiance  and Lasix  due to soft blood pressure.  Abdominal pain: - Patient was seen in the ED 12/7 for abdominal pain, CT abdomen and pelvis was unremarkable. -He was discharged on Protonix  and Carafate .   PPI to twice daily. -Continue with  bentyl  PRN.   Chronic Systolic heart failure mildly reduced ejection fraction 41 to 49% - Plan to hold Lasix  due to soft blood pressure in the setting of infection.  AKI on CKD stage III A -Creatinine on admission 2.0, previous creatinine 1.6--1.4 -Renal ultrasound: No hydronephrosis. Increased echogenicity of the kidneys as can be seen in medical renal disease. Non obstructing right nephrolithiasis. Cr down to 2.2 form 2.3 Hold IV fluids to avoid overload.   Diabetes type 2 with hyperglycemia long-term current use of insulin  - Continue to hold metformin   - Continue sliding scale insulin   Weakness, failure to thrive - Encourage oral intake.  Treat for pneumonia.  PT OT consult  Peripheral neuropathy Resume gabapentin     Enlarged prostate On terazosin    Tobacco abuse Cessation encouraged.   Coronary artery disease --ASA/Plavix , statin Holding aspirin  and Plavix  due to thrombocytopenia  Plan to resume plavix   Hyperlipidemia On statin   Hypertension See hypotension. Hold antihypertensives.   Paroxysmal A-fib (HCC) --  Appears on ASA/Plavix , not anticoagulation Plan to hold aspirin   due to thrombocytopenia   Thrombocytopenia Chronic. Slightly below baseline at 83k on admission.    Platelet level baseline 113 130s     Estimated body mass index is 21.26 kg/m as calculated from the following:   Height as  of this encounter: 5' 10 (1.778 m).   Weight as of this encounter: 67.2 kg.   DVT prophylaxis: SCDs Code Status: Full code Family Communication: Daughter over phone 12/15 Disposition Plan:  Status is: Observation The patient will require care spanning > 2 midnights and should be moved to inpatient because: Management of pneumonia and AKI    Consultants:  None  Procedures:  Renal ultrasound  Antimicrobials:    Subjective: He is feeling better,. Denies abdominal pain today. He report he ate breakfast.  He wants to go home.   Objective: Vitals:   09/17/24 0419 09/17/24 1400 09/17/24 2015 09/18/24 0507  BP: 109/71 116/84 111/74 130/71  Pulse: 82  89 88  Resp: 18 18 17 18   Temp: 97.9 F (36.6 C) 98.3 F (36.8 C) 97.6 F (36.4 C) 97.7 F (36.5 C)  TempSrc: Oral Oral Oral Oral  SpO2: 99% 98% 97% 93%  Weight:      Height:        Intake/Output Summary (Last 24 hours) at 09/18/2024 0803 Last data filed at 09/18/2024 0600 Gross per 24 hour  Intake 1784.3 ml  Output 550 ml  Net 1234.3 ml   Filed Weights   09/15/24 1630  Weight: 67.2 kg    Examination:  General exam: NAD Respiratory system: BL ronchus.  Cardiovascular system: S 1, S 2 RRR Gastrointestinal system: BS present, soft, nt Central nervous system: alert Extremities: right BKA   Data Reviewed: I have personally reviewed following labs and imaging studies  CBC: Recent Labs  Lab 09/15/24 1249 09/16/24 0159 09/17/24 0421 09/18/24 0514  WBC 6.0 5.8 6.1 6.3  NEUTROABS 4.4  --   --   --   HGB 9.8* 9.6* 10.0* 10.1*  HCT 30.6* 31.0* 31.9* 32.0*  MCV 99.4 101.0* 100.3* 100.0  PLT 83* 85* 79* 84*   Basic Metabolic Panel: Recent Labs  Lab 09/15/24 1249 09/16/24 0159 09/17/24 0421 09/18/24 0514  NA 139 137 135 135  K 4.4 5.1 4.8 4.6  CL 101 101 99 100  CO2 26 23 20* 20*  GLUCOSE 208* 200* 174* 171*  BUN 27* 29* 31* 32*  CREATININE 2.05* 2.11* 2.37* 2.29*  CALCIUM  9.9 8.8* 9.1 9.1    GFR: Estimated Creatinine Clearance: 22.8 mL/min (A) (by C-G formula based on SCr of 2.29 mg/dL (H)). Liver Function Tests: Recent Labs  Lab 09/15/24 1249  AST 92*  ALT 43  ALKPHOS 78  BILITOT 0.7  PROT 6.3*  ALBUMIN  3.7   No results for input(s): LIPASE, AMYLASE in the last 168 hours.  No results for input(s): AMMONIA in the last 168 hours. Coagulation Profile: No results for input(s): INR, PROTIME in the last 168 hours. Cardiac Enzymes: No results for input(s): CKTOTAL, CKMB, CKMBINDEX, TROPONINI in the last 168 hours. BNP (last 3 results) Recent Labs    05/14/24 1416  PROBNP 1,468.0*   HbA1C: No results for input(s): HGBA1C in the last 72 hours. CBG: Recent Labs  Lab 09/17/24 0727 09/17/24 1141 09/17/24 1610 09/17/24 2140 09/18/24 0742  GLUCAP 194* 247* 154* 162* 159*   Lipid Profile: No results for input(s): CHOL, HDL, LDLCALC, TRIG, CHOLHDL, LDLDIRECT in the last 72 hours.  Thyroid  Function Tests: No results for input(s): TSH, T4TOTAL, FREET4, T3FREE, THYROIDAB in the last 72 hours. Anemia Panel: No results for input(s): VITAMINB12, FOLATE, FERRITIN, TIBC, IRON , RETICCTPCT in the last 72 hours. Sepsis Labs: Recent Labs  Lab 09/15/24 1447 09/15/24 1856 09/16/24 0159 09/16/24 0541  PROCALCITON  --  <0.10 <0.10  --   LATICACIDVEN 2.5* 2.7* 2.6* 2.0*    Recent Results (from the past 240 hours)  Blood Culture (routine x 2)     Status: None (Preliminary result)   Collection Time: 09/15/24 12:49 PM   Specimen: BLOOD  Result Value Ref Range Status   Specimen Description   Final    BLOOD LEFT ANTECUBITAL Performed at [facility], [address], [place], [place] [address]    Special Requests   Final    BOTTLES DRAWN AEROBIC AND ANAEROBIC Blood Culture adequate volume Performed at [facility], [address] [place], [place] [address]    Culture   Final     NO GROWTH 2 DAYS Performed at [facility], [address], [place], [place] [address]    Report Status PENDING  Incomplete  Blood Culture (routine x 2)     Status: None (Preliminary result)   Collection Time: 09/15/24 12:49 PM   Specimen: BLOOD  Result Value Ref Range Status   Specimen Description   Final    BLOOD RIGHT ANTECUBITAL Performed at [facility], [address] [place], [place] [address]    Special Requests   Final    BOTTLES DRAWN AEROBIC AND ANAEROBIC Blood Culture adequate volume Performed at [facility], [address], [place], [place] [address]    Culture   Final    NO GROWTH 2 DAYS Performed at [facility], [address], [place], [place] [address]    Report Status PENDING  Incomplete  Resp panel by RT-PCR (RSV, Flu A&B, Covid) Peripheral     Status: None   Collection Time: 09/15/24 12:51 PM   Specimen: Peripheral; Nasal Swab  Result Value Ref Range Status   SARS Coronavirus 2 by RT PCR NEGATIVE NEGATIVE Final    Comment: (NOTE) SARS-CoV-2 target nucleic acids are NOT DETECTED.  The SARS-CoV-2 RNA is generally detectable in upper respiratory specimens during the acute phase of infection. The lowest concentration of SARS-CoV-2 viral copies this assay can detect is 138 copies/mL. A negative result does not preclude SARS-Cov-2 infection and should not be used as the sole basis for treatment or other patient management decisions. A negative result may occur with  improper specimen collection/handling, submission of specimen other than nasopharyngeal swab, presence of viral mutation(s) within the areas targeted by this assay, and inadequate number of viral copies(<138 copies/mL). A negative result must be combined with clinical observations, patient history, and epidemiological information. The expected result is Negative.  Fact Sheet for Patients:  bloggercourse.com  Fact  Sheet for Healthcare Providers:  seriousbroker.it  This test is no t yet approved or cleared by the [place]  FDA and  has been authorized for detection and/or diagnosis of SARS-CoV-2 by FDA under an Emergency Use Authorization (EUA). This EUA will remain  in effect (meaning this test can be used) for the duration of the COVID-19 declaration under Section 564(b)(1) of the Act, 21 U.S.C.section 360bbb-3(b)(1), unless the authorization is terminated  or revoked sooner.       Influenza A by PCR NEGATIVE NEGATIVE Final   Influenza B by PCR NEGATIVE NEGATIVE Final  Comment: (NOTE) The Xpert Xpress SARS-CoV-2/FLU/RSV plus assay is intended as an aid in the diagnosis of influenza from Nasopharyngeal swab specimens and should not be used as a sole basis for treatment. Nasal washings and aspirates are unacceptable for Xpert Xpress SARS-CoV-2/FLU/RSV testing.  Fact Sheet for Patients: bloggercourse.com  Fact Sheet for Healthcare Providers: seriousbroker.it  This test is not yet approved or cleared by the [place]  FDA and has been authorized for detection and/or diagnosis of SARS-CoV-2 by FDA under an Emergency Use Authorization (EUA). This EUA will remain in effect (meaning this test can be used) for the duration of the COVID-19 declaration under Section 564(b)(1) of the Act, 21 U.S.C. section 360bbb-3(b)(1), unless the authorization is terminated or revoked.     Resp Syncytial Virus by PCR NEGATIVE NEGATIVE Final    Comment: (NOTE) Fact Sheet for Patients: bloggercourse.com  Fact Sheet for Healthcare Providers: seriousbroker.it  This test is not yet approved or cleared by the [place]  FDA and has been authorized for detection and/or diagnosis of SARS-CoV-2 by FDA under an Emergency Use Authorization (EUA). This EUA will remain in effect  (meaning this test can be used) for the duration of the COVID-19 declaration under Section 564(b)(1) of the Act, 21 U.S.C. section 360bbb-3(b)(1), unless the authorization is terminated or revoked.  Performed at [facility], [address], [place], [place] [address]   Respiratory (~20 pathogens) panel by PCR     Status: Abnormal   Collection Time: 09/15/24 10:25 PM   Specimen: Nasopharyngeal Swab; Respiratory  Result Value Ref Range Status   Adenovirus NOT DETECTED NOT DETECTED Final   Coronavirus 229E NOT DETECTED NOT DETECTED Final    Comment: (NOTE) The Coronavirus on the Respiratory Panel, DOES NOT test for the novel  Coronavirus (2019 nCoV)    Coronavirus HKU1 NOT DETECTED NOT DETECTED Final   Coronavirus NL63 NOT DETECTED NOT DETECTED Final   Coronavirus OC43 NOT DETECTED NOT DETECTED Final   Metapneumovirus NOT DETECTED NOT DETECTED Final   Rhinovirus / Enterovirus DETECTED (A) NOT DETECTED Final   Influenza A NOT DETECTED NOT DETECTED Final   Influenza B NOT DETECTED NOT DETECTED Final   Parainfluenza Virus 1 NOT DETECTED NOT DETECTED Final   Parainfluenza Virus 2 NOT DETECTED NOT DETECTED Final   Parainfluenza Virus 3 NOT DETECTED NOT DETECTED Final   Parainfluenza Virus 4 NOT DETECTED NOT DETECTED Final   Respiratory Syncytial Virus NOT DETECTED NOT DETECTED Final   Bordetella pertussis NOT DETECTED NOT DETECTED Final   Bordetella Parapertussis NOT DETECTED NOT DETECTED Final   Chlamydophila pneumoniae NOT DETECTED NOT DETECTED Final   Mycoplasma pneumoniae NOT DETECTED NOT DETECTED Final    Comment: Performed at [facility], [address], [place], [place] [address]         Radiology Studies: DG CHEST PORT 1 VIEW Result Date: 09/17/2024 CLINICAL DATA:  Acute renal insufficiency. EXAM: PORTABLE CHEST 1 VIEW COMPARISON:  Chest radiograph dated 09/15/2024. FINDINGS: Mild eventration of the right hemidiaphragm with minimal right lung  base atelectasis. X-rays right pleural effusion may be present. No pneumothorax. The cardiac silhouette is within normal limits. No acute osseous pathology. IMPRESSION: Minimal right lung base atelectasis. No focal consolidation. Electronically Signed   By: Vanetta Chou M.D.   On: 09/17/2024 08:59   US  RENAL Result Date: 09/16/2024 CLINICAL DATA:  [id number] AKI (acute kidney injury) [id number] EXAM: RENAL / URINARY TRACT ULTRASOUND COMPLETE COMPARISON:  September 09, 2024 FINDINGS: Right Kidney: Renal measurements: 10.7  x 5.2 x 5.4 cm = volume: 156 mL. Echogenicity is increased. No mass or hydronephrosis visualized. 11 mm echogenic focus with twinkle artifact consistent with a nonobstructing nephrolithiasis. Portions are suboptimally assessed secondary to shadowing bowel gas. Left Kidney: Renal measurements: 9.6 x 5.4 x 3.3 cm = volume: 88 mL. Echogenicity is increased. No mass or hydronephrosis visualized. Portions are suboptimally assessed secondary to shadowing bowel gas. Bladder: Decompressed with mild circumferential wall prominence likely reflecting sequela of chronic outlet obstruction. Other: Small volume ascites. IMPRESSION: 1. No hydronephrosis. 2. Increased echogenicity of the kidneys as can be seen in medical renal disease. 3. Nonobstructing right nephrolithiasis. 4. Small volume ascites. Electronically Signed   By: Corean Salter M.D.   On: 09/16/2024 14:41        Scheduled Meds:  azithromycin   500 mg Oral Daily   guaiFENesin   600 mg Oral BID   insulin  aspart  0-9 Units Subcutaneous TID WC   pantoprazole  (PROTONIX ) IV  40 mg Intravenous Q12H   rosuvastatin   10 mg Oral Daily   sucralfate   1 g Oral BID   terazosin   5 mg Oral BID   Continuous Infusions:  cefTRIAXone  (ROCEPHIN )  IV Stopped (09/17/24 1412)   lactated ringers  75 mL/hr at 09/18/24 0600     LOS: 2 days    Time spent: 35 minutes,     Peri Kreft A Syerra Abdelrahman, MD [facility]   If 7PM-7AM, please contact  night-coverage [email or web address]  09/18/2024, 8:03 AM

## 2024-09-18 NOTE — Progress Notes (Signed)
 SLP Cancellation Note  Patient Details Name: Keith Espinoza MRN: [id number] DOB: [birth year]   Cancelled treatment:       Reason Eval/Treat Not Completed: Other (comment) Patient has previously expressed not wanting to participate in a modified barium swallow study (MBS). SLP to s/o at this time. If dysphagia concerns arise, would recommend MBS.     [name], MA, CCC-SLP Speech Therapy  09/18/2024, 4:52 PM

## 2024-09-19 ENCOUNTER — Other Ambulatory Visit: Payer: Self-pay | Admitting: Cardiology

## 2024-09-19 LAB — BASIC METABOLIC PANEL WITH GFR
Anion gap: 16 — ABNORMAL HIGH (ref 5–15)
BUN: 32 mg/dL — ABNORMAL HIGH (ref 8–23)
CO2: 20 mmol/L — ABNORMAL LOW (ref 22–32)
Calcium: 8.8 mg/dL — ABNORMAL LOW (ref 8.9–10.3)
Chloride: 98 mmol/L (ref 98–111)
Creatinine, Ser: 2.39 mg/dL — ABNORMAL HIGH (ref 0.61–1.24)
GFR, Estimated: 26 mL/min — ABNORMAL LOW (ref >60)
Glucose, Bld: 247 mg/dL — ABNORMAL HIGH (ref 70–99)
Potassium: 4.1 mmol/L (ref 3.5–5.1)
Sodium: 134 mmol/L — ABNORMAL LOW (ref 135–145)

## 2024-09-19 LAB — CBC
HCT: 30.1 % — ABNORMAL LOW (ref 39.0–52.0)
Hemoglobin: 9.6 g/dL — ABNORMAL LOW (ref 13.0–17.0)
MCH: 31.9 pg (ref 26.0–34.0)
MCHC: 31.9 g/dL (ref 30.0–36.0)
MCV: 100 fL (ref 80.0–100.0)
Platelets: 81 K/uL — ABNORMAL LOW (ref 150–400)
RBC: 3.01 MIL/uL — ABNORMAL LOW (ref 4.22–5.81)
RDW: 15.8 % — ABNORMAL HIGH (ref 11.5–15.5)
WBC: 5.7 K/uL (ref 4.0–10.5)
nRBC: 0 % (ref 0.0–0.2)

## 2024-09-19 LAB — GLUCOSE, CAPILLARY
Glucose-Capillary: 234 mg/dL — ABNORMAL HIGH (ref 70–99)
Glucose-Capillary: 211 mg/dL — ABNORMAL HIGH (ref 70–99)

## 2024-09-19 MED ORDER — PANTOPRAZOLE SODIUM 40 MG PO TBEC: 40.0000 mg | DELAYED_RELEASE_TABLET | Freq: Two times a day (BID) | ORAL | Status: DC

## 2024-09-19 MED ORDER — GABAPENTIN 300 MG PO CAPS: 300.0000 mg | ORAL_CAPSULE | Freq: Every day | ORAL | 0 refills | Status: AC

## 2024-09-19 MED ORDER — ALLOPURINOL 100 MG PO TABS: 50.0000 mg | ORAL_TABLET | ORAL | 0 refills | Status: DC

## 2024-09-19 MED ORDER — TRESIBA FLEXTOUCH 100 UNIT/ML ~~LOC~~ SOPN: 5.0000 [IU] | PEN_INJECTOR | Freq: Every day | SUBCUTANEOUS | 3 refills | Status: AC

## 2024-09-19 MED ORDER — LACTATED RINGERS IV SOLN: INTRAVENOUS | Status: DC

## 2024-09-19 MED ORDER — MELATONIN 5 MG PO TABS
5.0000 mg | ORAL_TABLET | Freq: Once | ORAL | Status: AC
  Administered 2024-09-19: 5 mg via ORAL
  Filled 2024-09-19: qty 1

## 2024-09-19 MED ORDER — SUCRALFATE 1 GM/10ML PO SUSP: 1.0000 g | Freq: Two times a day (BID) | ORAL | 0 refills | Status: AC

## 2024-09-19 MED ORDER — DICYCLOMINE HCL 10 MG PO CAPS: 10.0000 mg | ORAL_CAPSULE | Freq: Three times a day (TID) | ORAL | 0 refills | Status: AC | PRN

## 2024-09-19 MED ORDER — ALLOPURINOL 100 MG PO TABS
50.0000 mg | ORAL_TABLET | ORAL | Status: DC
  Administered 2024-09-19: 12:00:00 50 mg via ORAL
  Filled 2024-09-19: qty 1

## 2024-09-19 MED ORDER — FUROSEMIDE 20 MG PO TABS: 20.0000 mg | ORAL_TABLET | Freq: Every day | ORAL | 0 refills | Status: DC | PRN

## 2024-09-19 MED ORDER — PANTOPRAZOLE SODIUM 40 MG PO TBEC: 40.0000 mg | DELAYED_RELEASE_TABLET | Freq: Two times a day (BID) | ORAL | 0 refills | Status: AC

## 2024-09-19 MED ORDER — GUAIFENESIN ER 600 MG PO TB12: 600.0000 mg | ORAL_TABLET | Freq: Two times a day (BID) | ORAL | 0 refills | Status: DC

## 2024-09-19 NOTE — Discharge Summary (Signed)
 Physician Discharge Summary   Patient: Keith Espinoza MRN: [id number] DOB: 07/12/1941  Admit date:     09/15/2024  Discharge date: 09/19/2024  Discharge Physician: Owen DELENA Lore   PCP: Charlott Dorn DELENA, MD   Recommendations at discharge:   Needs close follow up for renal function.  Recommend Palliative care referral.  Needs CBC to follow platelet count  Discharge Diagnoses: Principal Problem:   Right lower lobe pneumonia Active Problems:   Hypotension   Lactic acidosis   Weakness   Type 2 diabetes mellitus with hyperglycemia, with long-term current use of insulin  (HCC)   AKI (acute kidney injury)   Chronic heart failure with mildly reduced ejection fraction (HFmrEF, 41-49%) (HCC)   Abdominal pain   Hypertension   Hyperlipidemia   Coronary artery disease   Tobacco abuse   Enlarged prostate   Peripheral neuropathy   CKD stage 3a, GFR 45-59 ml/min (HCC)   Thrombocytopenia   Paroxysmal A-fib (HCC)   Severe sepsis (HCC)  Resolved Problems:   * No resolved hospital problems. *  Hospital Course: 83 year old with past medical history significant for CAD status post stents, chronic heart failure reduced ejection fraction, hypertension, hyperlipidemia, diabetes type 2 history of right BKA, left femur fracture nonweightbearing for 57-month with subsequent debility and weakness, CKD stage IIIa, tobacco abuse presents for further evaluation of cough, ongoing poor appetite.  Of note patient had been seen in the ED 12/7 for abdominal pain workup was unremarkable and he was discharged on Protonix  and Carafate .  He still have intermittent abdominal pain.  No urinary symptoms.   Evaluation in the ED creatinine 2.0, AST 92, hemoglobin 9.8, lactic acid 3.8 improved to 2.5 after IV fluids.  Viral PCR and COVID and flu negative.  Patient blood pressure was soft 86/70.  Received IV fluids.  Chest x-ray showed right lower lobe opacity described as 1.5 cm spiculated that was not  visualized on CT chest 12/7.   Patient has been admitted for management of pneumonia, AKI, hypotension and lactic.  Assessment and Plan: 1-Right lower lobe pneumonia: Positive Rhino-Virus.  - Patient presented with cough, poor oral intake and weakness.  Chest x-ray showed right lower lung nodular like 1.5 cm spiculated airspace opacity.  Concern for infection not seen on previous CT.  Needs follow-up x-ray in 3 to 4 weeks to ensure resolution. - Continue IV ceftriaxone  for 5 days  and completed  azithromycin  - Continue Mucinex  -Discharge on Augmentin  for 2 more days.   Lactic acidosis: - In the setting of him showing trending down.  Also CKD   Hypotension: - Improved with IV fluid - Hold Jardiance  and Lasix  due to soft blood pressure.   Abdominal pain: - Patient was seen in the ED 12/7 for abdominal pain, CT abdomen and pelvis was unremarkable. -He was discharged on Protonix  and Carafate .   PPI to twice daily. -Continue with  bentyl  PRN.   pain resolved  Chronic Systolic heart failure mildly reduced ejection fraction 41 to 49% - Plan to hold Lasix  due to soft blood pressure in the setting of infection. Discharge on PRN lasix .   AKI on CKD stage III A -Creatinine on admission 2.0, previous creatinine 1.6--1.4 -Renal ultrasound: No hydronephrosis. Increased echogenicity of the kidneys as can be seen in medical renal disease. Non obstructing right nephrolithiasis. Cr down to 2.2 form 2.3 Hold IV fluids to avoid overload.  Unclear if this will be his new baseline. Cr 2.3 relatively stable. Patient does not wants to stay  in the hospital, he understand risk of worsening renal function,. He wouldn't want to proceed with HD neither. .    Diabetes type 2 with hyperglycemia long-term current use of insulin  - Continue to hold metformin   - Continue sliding scale insulin  -Resume low dose tresiba   Weakness, failure to thrive - Encourage oral intake.  Treat for pneumonia.  PT OT  consult   Peripheral neuropathy Resume gabapentin     Enlarged prostate On terazosin    Tobacco abuse Cessation encouraged.   Coronary artery disease --ASA/Plavix , statin Holding aspirin  and Plavix  due to thrombocytopenia  Resume plavix , needs cbc to monitor platelet count   Hyperlipidemia On statin   Hypertension See hypotension. Hold antihypertensives.   Paroxysmal A-fib (HCC) --Appears on ASA/Plavix , not anticoagulation Plan to hold aspirin   due to thrombocytopenia   Thrombocytopenia Chronic. Slightly below baseline at 83k on admission.    Platelet level baseline 113 130s          Consultants: none Procedures performed: none Disposition: Home Diet recommendation:  Cardiac diet DISCHARGE MEDICATION: Allergies as of 09/19/2024       Reactions   Simvastatin Other (See Comments)   SEVERE MYALGIAS   Zetia [ezetimibe] Other (See Comments)   MYALGIAS   Dilaudid  [hydromorphone  Hcl] Other (See Comments)   hallucination        Medication List     STOP taking these medications    aspirin  EC 81 MG tablet   empagliflozin  25 MG Tabs tablet Commonly known as: Jardiance    Klor-Con  M20 20 MEQ tablet Generic drug: potassium chloride  SA   Magnesium  400 MG Caps   meclizine  12.5 MG tablet Commonly known as: ANTIVERT    metFORMIN  1000 MG tablet Commonly known as: GLUCOPHAGE    QC TUMERIC COMPLEX PO       TAKE these medications    Acetaminophen  Extra Strength 500 MG Tabs Take 2 tablets (1,000 mg total) by mouth every 6 (six) hours. What changed:  when to take this reasons to take this   allopurinol  100 MG tablet Commonly known as: ZYLOPRIM  Take 0.5 tablets (50 mg total) by mouth every other day. Start taking on: September 21, 2024 What changed:  medication strength how much to take when to take this   amoxicillin -clavulanate 875-125 MG tablet Commonly known as: AUGMENTIN  Take 1 tablet by mouth 2 (two) times daily for 2 days.   B-12 1000  MCG Tabs Take 1,000 mcg by mouth daily.   clopidogrel  75 MG tablet Commonly known as: PLAVIX  Take 1 tablet (75 mg total) by mouth daily.   Dexcom G7 Sensor Misc 1 Device by Does not apply route as directed.   dicyclomine  10 MG capsule Commonly known as: BENTYL  Take 1 capsule (10 mg total) by mouth 3 (three) times daily as needed for spasms.   Fish Oil 500 MG Caps Take 500 mg by mouth daily.   furosemide  20 MG tablet Commonly known as: LASIX  Take 1 tablet (20 mg total) by mouth daily as needed. What changed:  when to take this reasons to take this   gabapentin  300 MG capsule Commonly known as: NEURONTIN  Take 1 capsule (300 mg total) by mouth at bedtime. What changed:  medication strength how much to take when to take this   guaiFENesin  600 MG 12 hr tablet Commonly known as: MUCINEX  Take 1 tablet (600 mg total) by mouth 2 (two) times daily.   Insulin  Pen Needle 32G X 4 MM Misc 1 Device by Does not apply route in the morning, at  noon, in the evening, and at bedtime.   nitroGLYCERIN  0.4 MG SL tablet Commonly known as: NITROSTAT  Place 1 tablet (0.4 mg total) under the tongue every 5 (five) minutes x 3 doses as needed for chest pain.   NovoLOG  FlexPen 100 UNIT/ML FlexPen Generic drug: insulin  aspart Max daily 30 units   pantoprazole  40 MG tablet Commonly known as: PROTONIX  Take 1 tablet (40 mg total) by mouth 2 (two) times daily. What changed: when to take this   rosuvastatin  20 MG tablet Commonly known as: CRESTOR  Take 1 tablet (20 mg total) by mouth daily.   senna-docusate 8.6-50 MG tablet Commonly known as: Senokot-S Take 1 tablet by mouth at bedtime. What changed: when to take this   sucralfate  1 GM/10ML suspension Commonly known as: Carafate  Take 10 mLs (1 g total) by mouth 2 (two) times daily. What changed: when to take this   terazosin  5 MG capsule Commonly known as: HYTRIN  Take 5 mg by mouth 2 (two) times daily.   Tresiba  FlexTouch 100 UNIT/ML  FlexTouch Pen Generic drug: insulin  degludec Inject 5 Units into the skin daily. What changed: how much to take   VITAMIN C PO Take 1 tablet by mouth daily.   Vitamin D3 50 MCG (2000 UT) Tabs Take 2,000 Units by mouth daily.        Follow-up Information     Charlott Dorn LABOR, MD Follow up.   Specialty: Internal Medicine Why: Needs lab work to monitor renal function Contact information: [address] [place] [address] [phone]                Discharge Exam: Fredricka Weights   09/15/24 1630  Weight: 67.2 kg   General; NAD  Condition at discharge: stable  The results of significant diagnostics from this hospitalization (including imaging, microbiology, ancillary and laboratory) are listed below for reference.   Imaging Studies: DG CHEST PORT 1 VIEW Result Date: 09/17/2024 CLINICAL DATA:  Acute renal insufficiency. EXAM: PORTABLE CHEST 1 VIEW COMPARISON:  Chest radiograph dated 09/15/2024. FINDINGS: Mild eventration of the right hemidiaphragm with minimal right lung base atelectasis. X-rays right pleural effusion may be present. No pneumothorax. The cardiac silhouette is within normal limits. No acute osseous pathology. IMPRESSION: Minimal right lung base atelectasis. No focal consolidation. Electronically Signed   By: Vanetta Chou M.D.   On: 09/17/2024 08:59   US  RENAL Result Date: 09/16/2024 CLINICAL DATA:  [id number] AKI (acute kidney injury) [id number] EXAM: RENAL / URINARY TRACT ULTRASOUND COMPLETE COMPARISON:  September 09, 2024 FINDINGS: Right Kidney: Renal measurements: 10.7 x 5.2 x 5.4 cm = volume: 156 mL. Echogenicity is increased. No mass or hydronephrosis visualized. 11 mm echogenic focus with twinkle artifact consistent with a nonobstructing nephrolithiasis. Portions are suboptimally assessed secondary to shadowing bowel gas. Left Kidney: Renal measurements: 9.6 x 5.4 x 3.3 cm = volume: 88 mL. Echogenicity is increased. No mass or  hydronephrosis visualized. Portions are suboptimally assessed secondary to shadowing bowel gas. Bladder: Decompressed with mild circumferential wall prominence likely reflecting sequela of chronic outlet obstruction. Other: Small volume ascites. IMPRESSION: 1. No hydronephrosis. 2. Increased echogenicity of the kidneys as can be seen in medical renal disease. 3. Nonobstructing right nephrolithiasis. 4. Small volume ascites. Electronically Signed   By: Corean Salter M.D.   On: 09/16/2024 14:41   DG Chest Port 1 View Result Date: 09/15/2024 EXAM: 1 VIEW(S) XRAY OF THE CHEST 09/15/2024 12:40:23 PM COMPARISON: 07/03/2023 CLINICAL HISTORY: Questionable sepsis - evaluate for abnormality  FINDINGS: LUNGS AND PLEURA: Low lung volumes with diffuse coarsened and attenuated bronchovascular markings. Right basilar atelectasis. Right lower lung zone nodular like 1.5 cm spiculated airspace opacity. Trace right pleural effusion. No pneumothorax. HEART AND MEDIASTINUM: Aortic atherosclerosis. BONES AND SOFT TISSUES: No acute osseous abnormality. IMPRESSION: 1. Right lower lung zone nodular like 1.5 cm spiculated airspace opacity. Finding not visualized on CT chest 09/09/24 and likely consolidation/infection. Follow-up PA and lateral chest X-ray is recommended in 3-4 weeks following therapy to ensure resolution. 2. Trace right pleural effusion. Electronically signed by: Morgane Naveau MD 09/15/2024 01:13 PM EST RP Workstation: [id number]   VAS US  ABI WITH/WO TBI Result Date: 09/14/2024  LOWER EXTREMITY DOPPLER STUDY Patient Name:  Abdulkarim Eberlin  Date of Exam:   09/14/2024 Medical Rec #: [id number]               Accession #:    [id number] Date of Birth: August 22, 1941               Patient Gender: M Patient Age:   83 years Exam Location:  [address] Procedure:      VAS US  ABI WITH/WO TBI Referring Phys: NORMAN SERVE --------------------------------------------------------------------------------  Indications:  Peripheral artery disease. Patieint reports swelling, numbness and              coldness to the left foot. Right BKA. High Risk Factors: Hypertension, hyperlipidemia, Diabetes, past history of                    smoking, coronary artery disease. Current smokeless tobacco. Other Factors: Right below knee amputation on 12/12/2020.  Comparison Study: On 06/23/2023, a lower arterial Doppler showed an ABI of .69                   on the left. Right BKA. Performing Technologist: Nanetta Shad RVT  Examination Guidelines: A complete evaluation includes at minimum, Doppler waveform signals and systolic blood pressure reading at the level of bilateral brachial, anterior tibial, and posterior tibial arteries, when vessel segments are accessible. Bilateral testing is considered an integral part of a complete examination. Photoelectric Plethysmograph (PPG) waveforms and toe systolic pressure readings are included as required and additional duplex testing as needed. Limited examinations for reoccurring indications may be performed as noted.  ABI Findings: +--------+------------------+-----+--------+--------+ Right   Rt Pressure (mmHg)IndexWaveformComment  +--------+------------------+-----+--------+--------+ [id number]                                     +--------+------------------+-----+--------+--------+ +---------+------------------+-----+--------+-------+ Left     Lt Pressure (mmHg)IndexWaveformComment +---------+------------------+-----+--------+-------+ Brachial 113                                    +---------+------------------+-----+--------+-------+ PTA      107               0.88                 +---------+------------------+-----+--------+-------+ DP       84                0.69                 +---------+------------------+-----+--------+-------+ Great Toe72                0.60                 +---------+------------------+-----+--------+-------+  Left ABIs appear  increased compared to prior study on 06/20/2023. Left TBIs appear essentially unchanged compared to prior study on 06/20/2023.  Summary: Right:  Right BKA. Left: Resting left ankle-brachial index indicates mild left lower extremity arterial disease. The left toe-brachial index is abnormal. Left toe pressure is >60 mmHg which suggests adequate perfusion for healing. *See table(s) above for measurements and observations.  Electronically signed by Norman Serve on 09/14/2024 at 1:32:17 PM.    Final    CT Chest Wo Contrast Result Date: 09/09/2024 EXAM: CT CHEST WITHOUT CONTRAST 09/09/2024 09:13:27 PM TECHNIQUE: CT of the chest was performed without the administration of intravenous contrast. Multiplanar reformatted images are provided for review. Automated exposure control, iterative reconstruction, and/or weight based adjustment of the mA/kV was utilized to reduce the radiation dose to as low as reasonably achievable. COMPARISON: 05/14/2024. CLINICAL HISTORY: Chest wall mass, US /xray nondiagnostic. FINDINGS: MEDIASTINUM: Diffuse calcifications throughout the coronary arteries. Moderate aortic atherosclerosis. The central airways are clear. LYMPH NODES: No mediastinal, hilar or axillary lymphadenopathy. LUNGS AND PLEURA: Small bilateral pleural effusions. Dependent and bibasilar atelectasis. No focal consolidation or pulmonary edema. No pneumothorax. SOFT TISSUES/BONES: No chest wall mass visualized. No acute abnormality of the bones. UPPER ABDOMEN: Limited images of the upper abdomen demonstrates no acute abnormality. IMPRESSION: 1. No chest wall mass visualized. 2. Small bilateral pleural effusions with dependent and bibasilar atelectasis. 3. Diffuse coronary artery disease, aortic atherosclerosis Electronically signed by: Franky Crease MD 09/09/2024 09:22 PM EST RP Workstation: [id number]   CT ABDOMEN PELVIS W CONTRAST Result Date: 09/09/2024 EXAM: CT ABDOMEN AND PELVIS WITH CONTRAST 09/09/2024 09:13:01 PM  TECHNIQUE: CT of the abdomen and pelvis was performed with the administration of 70 mL of iohexol  (OMNIPAQUE ) 300 MG/ML solution. Multiplanar reformatted images are provided for review. Automated exposure control, iterative reconstruction, and/or weight-based adjustment of the mA/kV was utilized to reduce the radiation dose to as low as reasonably achievable. COMPARISON: 05/14/2024 CLINICAL HISTORY: Abdominal pain, acute, nonlocalized. FINDINGS: LOWER CHEST: Small bilateral pleural effusions. Diffuse calcifications throughout the visualized coronary arteries. LIVER: The liver is unremarkable. GALLBLADDER AND BILE DUCTS: Prior cholecystectomy. No biliary ductal dilatation. SPLEEN: No acute abnormality. PANCREAS: No acute abnormality. ADRENAL GLANDS: No acute abnormality. KIDNEYS, URETERS AND BLADDER: Bilateral nephrolithiasis. No ureteral stones or hydronephrosis. No perinephric or periureteral stranding. Urinary bladder is unremarkable. GI AND BOWEL: Stomach demonstrates no acute abnormality. There is no bowel obstruction. PERITONEUM AND RETROPERITONEUM: No ascites. No free air. VASCULATURE: Aortic atherosclerosis. Diffuse aortoiliac atherosclerosis. Aorta is normal in caliber. LYMPH NODES: No lymphadenopathy. REPRODUCTIVE ORGANS: Prostate enlargement with calcifications. BONES AND SOFT TISSUES: Right hip replacement. No acute osseous abnormality. No focal soft tissue abnormality. IMPRESSION: 1. No acute findings in the abdomen or pelvis. 2. Bilateral nephrolithiasis. No hydronephrosis. 3. Diffuse coronary artery disease, aortic atherosclerosis. Electronically signed by: Franky Crease MD 09/09/2024 09:19 PM EST RP Workstation: [id number]    Microbiology: Results for orders placed or performed during the hospital encounter of 09/15/24  Blood Culture (routine x 2)     Status: None (Preliminary result)   Collection Time: 09/15/24 12:49 PM   Specimen: BLOOD  Result Value Ref Range Status   Specimen Description    Final    BLOOD LEFT ANTECUBITAL Performed at [facility], [address], [place], [place] [address]    Special Requests   Final    BOTTLES DRAWN AEROBIC AND ANAEROBIC Blood Culture adequate volume Performed at [facility], [address], [place], [place] [address]  Culture   Final    NO GROWTH 4 DAYS Performed at [facility], [address], [place], [place] [address]    Report Status PENDING  Incomplete  Blood Culture (routine x 2)     Status: None (Preliminary result)   Collection Time: 09/15/24 12:49 PM   Specimen: BLOOD  Result Value Ref Range Status   Specimen Description   Final    BLOOD RIGHT ANTECUBITAL Performed at [facility], [address], [place], [place] [address]    Special Requests   Final    BOTTLES DRAWN AEROBIC AND ANAEROBIC Blood Culture adequate volume Performed at [facility], [address], [place], [place] [address]    Culture   Final    NO GROWTH 4 DAYS Performed at [facility], [address] [place], [place] [address]    Report Status PENDING  Incomplete  Resp panel by RT-PCR (RSV, Flu A&B, Covid) Peripheral     Status: None   Collection Time: 09/15/24 12:51 PM   Specimen: Peripheral; Nasal Swab  Result Value Ref Range Status   SARS Coronavirus 2 by RT PCR NEGATIVE NEGATIVE Final    Comment: (NOTE) SARS-CoV-2 target nucleic acids are NOT DETECTED.  The SARS-CoV-2 RNA is generally detectable in upper respiratory specimens during the acute phase of infection. The lowest concentration of SARS-CoV-2 viral copies this assay can detect is 138 copies/mL. A negative result does not preclude SARS-Cov-2 infection and should not be used as the sole basis for treatment or other patient management decisions. A negative result may occur with  improper specimen collection/handling, submission of specimen other than nasopharyngeal swab, presence of viral  mutation(s) within the areas targeted by this assay, and inadequate number of viral copies(<138 copies/mL). A negative result must be combined with clinical observations, patient history, and epidemiological information. The expected result is Negative.  Fact Sheet for Patients:  bloggercourse.com  Fact Sheet for Healthcare Providers:  seriousbroker.it  This test is no t yet approved or cleared by the [place]  FDA and  has been authorized for detection and/or diagnosis of SARS-CoV-2 by FDA under an Emergency Use Authorization (EUA). This EUA will remain  in effect (meaning this test can be used) for the duration of the COVID-19 declaration under Section 564(b)(1) of the Act, 21 U.S.C.section 360bbb-3(b)(1), unless the authorization is terminated  or revoked sooner.       Influenza A by PCR NEGATIVE NEGATIVE Final   Influenza B by PCR NEGATIVE NEGATIVE Final    Comment: (NOTE) The Xpert Xpress SARS-CoV-2/FLU/RSV plus assay is intended as an aid in the diagnosis of influenza from Nasopharyngeal swab specimens and should not be used as a sole basis for treatment. Nasal washings and aspirates are unacceptable for Xpert Xpress SARS-CoV-2/FLU/RSV testing.  Fact Sheet for Patients: bloggercourse.com  Fact Sheet for Healthcare Providers: seriousbroker.it  This test is not yet approved or cleared by the [place]  FDA and has been authorized for detection and/or diagnosis of SARS-CoV-2 by FDA under an Emergency Use Authorization (EUA). This EUA will remain in effect (meaning this test can be used) for the duration of the COVID-19 declaration under Section 564(b)(1) of the Act, 21 U.S.C. section 360bbb-3(b)(1), unless the authorization is terminated or revoked.     Resp Syncytial Virus by PCR NEGATIVE NEGATIVE Final    Comment: (NOTE) Fact Sheet for  Patients: bloggercourse.com  Fact Sheet for Healthcare Providers: seriousbroker.it  This test is not yet approved or cleared by  the [place]  FDA and has been authorized for detection and/or diagnosis of SARS-CoV-2 by FDA under an Emergency Use Authorization (EUA). This EUA will remain in effect (meaning this test can be used) for the duration of the COVID-19 declaration under Section 564(b)(1) of the Act, 21 U.S.C. section 360bbb-3(b)(1), unless the authorization is terminated or revoked.  Performed at [facility], [address], [place], [place] [address]   Respiratory (~20 pathogens) panel by PCR     Status: Abnormal   Collection Time: 09/15/24 10:25 PM   Specimen: Nasopharyngeal Swab; Respiratory  Result Value Ref Range Status   Adenovirus NOT DETECTED NOT DETECTED Final   Coronavirus 229E NOT DETECTED NOT DETECTED Final    Comment: (NOTE) The Coronavirus on the Respiratory Panel, DOES NOT test for the novel  Coronavirus (2019 nCoV)    Coronavirus HKU1 NOT DETECTED NOT DETECTED Final   Coronavirus NL63 NOT DETECTED NOT DETECTED Final   Coronavirus OC43 NOT DETECTED NOT DETECTED Final   Metapneumovirus NOT DETECTED NOT DETECTED Final   Rhinovirus / Enterovirus DETECTED (A) NOT DETECTED Final   Influenza A NOT DETECTED NOT DETECTED Final   Influenza B NOT DETECTED NOT DETECTED Final   Parainfluenza Virus 1 NOT DETECTED NOT DETECTED Final   Parainfluenza Virus 2 NOT DETECTED NOT DETECTED Final   Parainfluenza Virus 3 NOT DETECTED NOT DETECTED Final   Parainfluenza Virus 4 NOT DETECTED NOT DETECTED Final   Respiratory Syncytial Virus NOT DETECTED NOT DETECTED Final   Bordetella pertussis NOT DETECTED NOT DETECTED Final   Bordetella Parapertussis NOT DETECTED NOT DETECTED Final   Chlamydophila pneumoniae NOT DETECTED NOT DETECTED Final   Mycoplasma pneumoniae NOT DETECTED NOT DETECTED Final     Comment: Performed at [facility], [address], [place], [place] [address]    Labs: CBC: Recent Labs  Lab 09/15/24 1249 09/16/24 0159 09/17/24 0421 09/18/24 0514 09/19/24 0525  WBC 6.0 5.8 6.1 6.3 5.7  NEUTROABS 4.4  --   --   --   --   HGB 9.8* 9.6* 10.0* 10.1* 9.6*  HCT 30.6* 31.0* 31.9* 32.0* 30.1*  MCV 99.4 101.0* 100.3* 100.0 100.0  PLT 83* 85* 79* 84* 81*   Basic Metabolic Panel: Recent Labs  Lab 09/15/24 1249 09/16/24 0159 09/17/24 0421 09/18/24 0514 09/19/24 0525  NA 139 137 135 135 134*  K 4.4 5.1 4.8 4.6 4.1  CL 101 101 99 100 98  CO2 26 23 20* 20* 20*  GLUCOSE 208* 200* 174* 171* 247*  BUN 27* 29* 31* 32* 32*  CREATININE 2.05* 2.11* 2.37* 2.29* 2.39*  CALCIUM  9.9 8.8* 9.1 9.1 8.8*   Liver Function Tests: Recent Labs  Lab 09/15/24 1249  AST 92*  ALT 43  ALKPHOS 78  BILITOT 0.7  PROT 6.3*  ALBUMIN  3.7   CBG: Recent Labs  Lab 09/18/24 0742 09/18/24 0934 09/18/24 1115 09/19/24 0853 09/19/24 1131  GLUCAP 159* 174* 191* 234* 211*    Discharge time spent: greater than 30 minutes.  Signed: Owen DELENA Lore, MD [facility] 09/19/2024

## 2024-09-19 NOTE — TOC Transition Note (Signed)
 Transition of Care [facility]) - Discharge Note   Patient Details  Name: Lorenzo Arscott MRN: [id number] Date of Birth: Oct 30, 1940  Transition of [facility]) CM/SW Contact:  Bascom Service, RN Phone Number: 09/19/2024, 12:41 PM   Clinical Narrative:  d/c home no CM needs. Has own transport home.     Final next level of care: Home/Self Care Barriers to Discharge: No Barriers Identified   Patient Goals and CMS Choice Patient states their goals for this hospitalization and ongoing recovery are:: Home CMS Medicare.gov Compare Post Acute Care list provided to:: Patient Choice offered to / list presented to : Patient  ownership interest in [facility].provided to:: Patient    Discharge Placement                       Discharge Plan and Services Additional resources added to the After Visit Summary for     Discharge Planning Services: CM Consult                                 Social Drivers of Health (SDOH) Interventions SDOH Screenings   Food Insecurity: No Food Insecurity (09/15/2024)  Housing: Low Risk (09/15/2024)  Transportation Needs: No Transportation Needs (09/15/2024)  Utilities: Not At Risk (09/15/2024)  Depression (PHQ2-9): Low Risk (04/21/2022)  Financial Resource Strain: Low Risk (05/16/2023)   Received from [facility]  Social Connections: Socially Isolated (09/15/2024)  Stress: No Stress Concern Present (06/12/2023)   Received from [facility]  Tobacco Use: High Risk (09/15/2024)     Readmission Risk Interventions     No data to display

## 2024-09-19 NOTE — Progress Notes (Signed)
 Patient refused vital signs, Meds and CBG check.Will retry later.

## 2024-09-19 NOTE — Plan of Care (Signed)
   Problem: Clinical Measurements: Goal: Ability to maintain clinical measurements within normal limits will improve Outcome: Progressing

## 2024-09-20 LAB — CULTURE, BLOOD (ROUTINE X 2)
Culture: NO GROWTH
Culture: NO GROWTH
Special Requests: ADEQUATE
Special Requests: ADEQUATE

## 2024-09-21 ENCOUNTER — Other Ambulatory Visit: Payer: Self-pay

## 2024-09-21 MED ORDER — CLOPIDOGREL BISULFATE 75 MG PO TABS: 75.0000 mg | ORAL_TABLET | Freq: Every day | ORAL | 3 refills | Status: AC

## 2024-09-23 ENCOUNTER — Encounter (HOSPITAL_BASED_OUTPATIENT_CLINIC_OR_DEPARTMENT_OTHER): Payer: Self-pay

## 2024-09-23 ENCOUNTER — Other Ambulatory Visit: Payer: Self-pay

## 2024-09-23 ENCOUNTER — Emergency Department (HOSPITAL_BASED_OUTPATIENT_CLINIC_OR_DEPARTMENT_OTHER)

## 2024-09-23 ENCOUNTER — Inpatient Hospital Stay (HOSPITAL_BASED_OUTPATIENT_CLINIC_OR_DEPARTMENT_OTHER)
Admission: EM | Admit: 2024-09-23 | Discharge: 2024-09-25 | DRG: 291 | Disposition: A | Discharge status: Home Health | Attending: Hospitalist | Admitting: Hospitalist

## 2024-09-23 DIAGNOSIS — R531 Weakness: Secondary | ICD-10-CM | POA: Diagnosis present

## 2024-09-23 DIAGNOSIS — N189 Chronic kidney disease, unspecified: Secondary | ICD-10-CM

## 2024-09-23 DIAGNOSIS — I5023 Acute on chronic systolic (congestive) heart failure: Secondary | ICD-10-CM | POA: Diagnosis present

## 2024-09-23 DIAGNOSIS — Z794 Long term (current) use of insulin: Secondary | ICD-10-CM

## 2024-09-23 DIAGNOSIS — Z716 Tobacco abuse counseling: Secondary | ICD-10-CM

## 2024-09-23 DIAGNOSIS — Z89511 Acquired absence of right leg below knee: Secondary | ICD-10-CM | POA: Diagnosis not present

## 2024-09-23 DIAGNOSIS — I480 Paroxysmal atrial fibrillation: Secondary | ICD-10-CM | POA: Diagnosis present

## 2024-09-23 DIAGNOSIS — E8770 Fluid overload, unspecified: Secondary | ICD-10-CM

## 2024-09-23 DIAGNOSIS — I130 Hypertensive heart and chronic kidney disease with heart failure and stage 1 through stage 4 chronic kidney disease, or unspecified chronic kidney disease: Principal | ICD-10-CM | POA: Diagnosis present

## 2024-09-23 DIAGNOSIS — E1140 Type 2 diabetes mellitus with diabetic neuropathy, unspecified: Secondary | ICD-10-CM | POA: Diagnosis present

## 2024-09-23 DIAGNOSIS — Z85828 Personal history of other malignant neoplasm of skin: Secondary | ICD-10-CM

## 2024-09-23 DIAGNOSIS — Z83438 Family history of other disorder of lipoprotein metabolism and other lipidemia: Secondary | ICD-10-CM

## 2024-09-23 DIAGNOSIS — R601 Generalized edema: Secondary | ICD-10-CM | POA: Diagnosis present

## 2024-09-23 DIAGNOSIS — Z720 Tobacco use: Secondary | ICD-10-CM | POA: Diagnosis present

## 2024-09-23 DIAGNOSIS — D696 Thrombocytopenia, unspecified: Secondary | ICD-10-CM | POA: Diagnosis present

## 2024-09-23 DIAGNOSIS — E1165 Type 2 diabetes mellitus with hyperglycemia: Secondary | ICD-10-CM | POA: Diagnosis present

## 2024-09-23 DIAGNOSIS — R1310 Dysphagia, unspecified: Secondary | ICD-10-CM | POA: Diagnosis present

## 2024-09-23 DIAGNOSIS — I4891 Unspecified atrial fibrillation: Secondary | ICD-10-CM | POA: Diagnosis present

## 2024-09-23 DIAGNOSIS — R748 Abnormal levels of other serum enzymes: Secondary | ICD-10-CM | POA: Diagnosis present

## 2024-09-23 DIAGNOSIS — I2510 Atherosclerotic heart disease of native coronary artery without angina pectoris: Secondary | ICD-10-CM | POA: Diagnosis present

## 2024-09-23 DIAGNOSIS — F17220 Nicotine dependence, chewing tobacco, uncomplicated: Secondary | ICD-10-CM | POA: Diagnosis present

## 2024-09-23 DIAGNOSIS — Z8249 Family history of ischemic heart disease and other diseases of the circulatory system: Secondary | ICD-10-CM

## 2024-09-23 DIAGNOSIS — I252 Old myocardial infarction: Secondary | ICD-10-CM

## 2024-09-23 DIAGNOSIS — I493 Ventricular premature depolarization: Secondary | ICD-10-CM | POA: Diagnosis present

## 2024-09-23 DIAGNOSIS — E876 Hypokalemia: Secondary | ICD-10-CM | POA: Diagnosis present

## 2024-09-23 DIAGNOSIS — Z8616 Personal history of COVID-19: Secondary | ICD-10-CM

## 2024-09-23 DIAGNOSIS — R109 Unspecified abdominal pain: Secondary | ICD-10-CM | POA: Diagnosis present

## 2024-09-23 DIAGNOSIS — E1151 Type 2 diabetes mellitus with diabetic peripheral angiopathy without gangrene: Secondary | ICD-10-CM | POA: Diagnosis present

## 2024-09-23 DIAGNOSIS — Z9049 Acquired absence of other specified parts of digestive tract: Secondary | ICD-10-CM

## 2024-09-23 DIAGNOSIS — R188 Other ascites: Secondary | ICD-10-CM | POA: Diagnosis present

## 2024-09-23 DIAGNOSIS — E1122 Type 2 diabetes mellitus with diabetic chronic kidney disease: Secondary | ICD-10-CM | POA: Diagnosis present

## 2024-09-23 DIAGNOSIS — N179 Acute kidney failure, unspecified: Secondary | ICD-10-CM | POA: Diagnosis present

## 2024-09-23 DIAGNOSIS — Z96652 Presence of left artificial knee joint: Secondary | ICD-10-CM | POA: Diagnosis present

## 2024-09-23 DIAGNOSIS — N400 Enlarged prostate without lower urinary tract symptoms: Secondary | ICD-10-CM | POA: Diagnosis present

## 2024-09-23 DIAGNOSIS — N183 Chronic kidney disease, stage 3 unspecified: Secondary | ICD-10-CM | POA: Diagnosis present

## 2024-09-23 DIAGNOSIS — E785 Hyperlipidemia, unspecified: Secondary | ICD-10-CM | POA: Diagnosis present

## 2024-09-23 DIAGNOSIS — Z833 Family history of diabetes mellitus: Secondary | ICD-10-CM

## 2024-09-23 DIAGNOSIS — K219 Gastro-esophageal reflux disease without esophagitis: Secondary | ICD-10-CM | POA: Diagnosis present

## 2024-09-23 DIAGNOSIS — R7989 Other specified abnormal findings of blood chemistry: Secondary | ICD-10-CM | POA: Diagnosis present

## 2024-09-23 DIAGNOSIS — Z79899 Other long term (current) drug therapy: Secondary | ICD-10-CM

## 2024-09-23 DIAGNOSIS — Z7902 Long term (current) use of antithrombotics/antiplatelets: Secondary | ICD-10-CM | POA: Diagnosis not present

## 2024-09-23 DIAGNOSIS — I5020 Unspecified systolic (congestive) heart failure: Secondary | ICD-10-CM | POA: Diagnosis not present

## 2024-09-23 DIAGNOSIS — Z87442 Personal history of urinary calculi: Secondary | ICD-10-CM

## 2024-09-23 DIAGNOSIS — I509 Heart failure, unspecified: Secondary | ICD-10-CM

## 2024-09-23 DIAGNOSIS — Z955 Presence of coronary angioplasty implant and graft: Secondary | ICD-10-CM

## 2024-09-23 HISTORY — DX: Dizziness and giddiness: R42

## 2024-09-23 LAB — COMPREHENSIVE METABOLIC PANEL WITH GFR
ALT: 89 U/L — ABNORMAL HIGH (ref 0–44)
AST: 45 U/L — ABNORMAL HIGH (ref 15–41)
Albumin: 3.7 g/dL (ref 3.5–5.0)
Alkaline Phosphatase: 95 U/L (ref 38–126)
Anion gap: 15 (ref 5–15)
BUN: 19 mg/dL (ref 8–23)
CO2: 25 mmol/L (ref 22–32)
Calcium: 9.7 mg/dL (ref 8.9–10.3)
Chloride: 100 mmol/L (ref 98–111)
Creatinine, Ser: 2.55 mg/dL — ABNORMAL HIGH (ref 0.61–1.24)
GFR, Estimated: 24 mL/min — ABNORMAL LOW
Glucose, Bld: 189 mg/dL — ABNORMAL HIGH (ref 70–99)
Potassium: 3.5 mmol/L (ref 3.5–5.1)
Sodium: 140 mmol/L (ref 135–145)
Total Bilirubin: 0.5 mg/dL (ref 0.0–1.2)
Total Protein: 6.4 g/dL — ABNORMAL LOW (ref 6.5–8.1)

## 2024-09-23 LAB — RESP PANEL BY RT-PCR (RSV, FLU A&B, COVID)  RVPGX2
Influenza A by PCR: NEGATIVE
Influenza B by PCR: NEGATIVE
Resp Syncytial Virus by PCR: NEGATIVE
SARS Coronavirus 2 by RT PCR: NEGATIVE

## 2024-09-23 LAB — URINALYSIS, ROUTINE W REFLEX MICROSCOPIC
Bilirubin Urine: NEGATIVE
Glucose, UA: >1000 mg/dL — AB
Ketones, ur: NEGATIVE mg/dL
Leukocytes,Ua: NEGATIVE
Nitrite: NEGATIVE
Protein, ur: 30 mg/dL — AB
Specific Gravity, Urine: 1.007 (ref 1.005–1.030)
pH: 5.5 (ref 5.0–8.0)

## 2024-09-23 LAB — GLUCOSE, CAPILLARY
Glucose-Capillary: 139 mg/dL — ABNORMAL HIGH (ref 70–99)
Glucose-Capillary: 160 mg/dL — ABNORMAL HIGH (ref 70–99)

## 2024-09-23 LAB — CBC WITH DIFFERENTIAL/PLATELET
Abs Immature Granulocytes: 0.02 K/uL (ref 0.00–0.07)
Basophils Absolute: 0 K/uL (ref 0.0–0.1)
Basophils Relative: 0 %
Eosinophils Absolute: 1.4 K/uL — ABNORMAL HIGH (ref 0.0–0.5)
Eosinophils Relative: 25 %
HCT: 31.6 % — ABNORMAL LOW (ref 39.0–52.0)
Hemoglobin: 10.3 g/dL — ABNORMAL LOW (ref 13.0–17.0)
Immature Granulocytes: 0 %
Lymphocytes Relative: 18 %
Lymphs Abs: 1 K/uL (ref 0.7–4.0)
MCH: 31.7 pg (ref 26.0–34.0)
MCHC: 32.6 g/dL (ref 30.0–36.0)
MCV: 97.2 fL (ref 80.0–100.0)
Monocytes Absolute: 0.4 K/uL (ref 0.1–1.0)
Monocytes Relative: 7 %
Neutro Abs: 2.8 K/uL (ref 1.7–7.7)
Neutrophils Relative %: 50 %
Platelets: 80 K/uL — ABNORMAL LOW (ref 150–400)
RBC: 3.25 MIL/uL — ABNORMAL LOW (ref 4.22–5.81)
RDW: 16.2 % — ABNORMAL HIGH (ref 11.5–15.5)
WBC: 5.7 K/uL (ref 4.0–10.5)
nRBC: 0 % (ref 0.0–0.2)

## 2024-09-23 LAB — LIPASE, BLOOD: Lipase: 42 U/L (ref 11–51)

## 2024-09-23 LAB — TROPONIN T, HIGH SENSITIVITY: Troponin T High Sensitivity: 132 ng/L (ref 0–19)

## 2024-09-23 LAB — PRO BRAIN NATRIURETIC PEPTIDE: Pro Brain Natriuretic Peptide: 20065 pg/mL — ABNORMAL HIGH

## 2024-09-23 MED ORDER — ACETAMINOPHEN 325 MG PO TABS: 650.0000 mg | ORAL_TABLET | Freq: Four times a day (QID) | ORAL | Status: DC | PRN

## 2024-09-23 MED ORDER — ROSUVASTATIN CALCIUM 20 MG PO TABS
20.0000 mg | ORAL_TABLET | Freq: Every day | ORAL | Status: DC
  Administered 2024-09-24 – 2024-09-25 (×2): 20 mg via ORAL
  Filled 2024-09-23 (×3): qty 1

## 2024-09-23 MED ORDER — SODIUM CHLORIDE 0.9% FLUSH
3.0000 mL | Freq: Two times a day (BID) | INTRAVENOUS | Status: DC
  Administered 2024-09-24 – 2024-09-25 (×3): 3 mL via INTRAVENOUS

## 2024-09-23 MED ORDER — HEPARIN SODIUM (PORCINE) 5000 UNIT/ML IJ SOLN: 5000.0000 [IU] | Freq: Three times a day (TID) | INTRAMUSCULAR | Status: DC

## 2024-09-23 MED ORDER — INSULIN GLARGINE 100 UNIT/ML ~~LOC~~ SOLN
5.0000 [IU] | Freq: Every day | SUBCUTANEOUS | Status: DC
  Administered 2024-09-24 – 2024-09-25 (×2): 5 [IU] via SUBCUTANEOUS
  Filled 2024-09-23 (×2): qty 0.05

## 2024-09-23 MED ORDER — TERAZOSIN HCL 5 MG PO CAPS
5.0000 mg | ORAL_CAPSULE | Freq: Two times a day (BID) | ORAL | Status: DC
  Administered 2024-09-23 – 2024-09-25 (×4): 5 mg via ORAL
  Filled 2024-09-23 (×6): qty 1

## 2024-09-23 MED ORDER — ACETAMINOPHEN 650 MG RE SUPP: 650.0000 mg | Freq: Four times a day (QID) | RECTAL | Status: DC | PRN

## 2024-09-23 MED ORDER — FUROSEMIDE 10 MG/ML IJ SOLN
40.0000 mg | Freq: Two times a day (BID) | INTRAMUSCULAR | Status: DC
  Administered 2024-09-24 – 2024-09-25 (×3): 40 mg via INTRAVENOUS
  Filled 2024-09-23 (×3): qty 4

## 2024-09-23 MED ORDER — INSULIN ASPART 100 UNIT/ML IJ SOLN
0.0000 [IU] | Freq: Three times a day (TID) | INTRAMUSCULAR | Status: DC
  Administered 2024-09-24: 3 [IU] via SUBCUTANEOUS
  Administered 2024-09-24: 5 [IU] via SUBCUTANEOUS
  Administered 2024-09-24: 2 [IU] via SUBCUTANEOUS
  Administered 2024-09-25 (×2): 5 [IU] via SUBCUTANEOUS
  Filled 2024-09-23 (×2): qty 3
  Filled 2024-09-23 (×2): qty 5
  Filled 2024-09-23: qty 3

## 2024-09-23 MED ORDER — PANTOPRAZOLE SODIUM 40 MG PO TBEC
40.0000 mg | DELAYED_RELEASE_TABLET | Freq: Two times a day (BID) | ORAL | Status: DC
  Administered 2024-09-23 – 2024-09-25 (×4): 40 mg via ORAL
  Filled 2024-09-23 (×4): qty 1

## 2024-09-23 MED ORDER — SUCRALFATE 1 GM/10ML PO SUSP
1.0000 g | Freq: Two times a day (BID) | ORAL | Status: DC
  Administered 2024-09-24 (×2): 1 g via ORAL
  Filled 2024-09-23 (×2): qty 10

## 2024-09-23 MED ORDER — ALBUTEROL SULFATE (2.5 MG/3ML) 0.083% IN NEBU: 2.5000 mg | INHALATION_SOLUTION | Freq: Four times a day (QID) | RESPIRATORY_TRACT | Status: DC | PRN

## 2024-09-23 MED ORDER — DICYCLOMINE HCL 10 MG PO CAPS: 10.0000 mg | ORAL_CAPSULE | Freq: Three times a day (TID) | ORAL | Status: DC | PRN

## 2024-09-23 MED ORDER — FUROSEMIDE 10 MG/ML IJ SOLN
60.0000 mg | Freq: Once | INTRAMUSCULAR | Status: AC
  Administered 2024-09-23: 60 mg via INTRAVENOUS
  Filled 2024-09-23: qty 6

## 2024-09-23 MED ORDER — INSULIN GLARGINE 100 UNIT/ML ~~LOC~~ SOLN
5.0000 [IU] | Freq: Every day | SUBCUTANEOUS | Status: DC
  Filled 2024-09-23: qty 0.05

## 2024-09-23 MED ORDER — INSULIN DEGLUDEC 100 UNIT/ML ~~LOC~~ SOPN: 5.0000 [IU] | PEN_INJECTOR | Freq: Every day | SUBCUTANEOUS | Status: DC

## 2024-09-23 MED ORDER — ALLOPURINOL 100 MG PO TABS
50.0000 mg | ORAL_TABLET | ORAL | Status: DC
  Administered 2024-09-24: 50 mg via ORAL
  Filled 2024-09-23: qty 1

## 2024-09-23 NOTE — ED Triage Notes (Signed)
 Pt was admitted to [facility] earlier this week and family report that pt was not given his medications appropriately. Pt reports increased leg and abd swelling.

## 2024-09-23 NOTE — Plan of Care (Signed)

## 2024-09-23 NOTE — H&P (Addendum)
 " History and Physical    Patient: Keith Espinoza FMW:[id number] DOB: [birth year] DOA: 09/23/2024 DOS: the patient was seen and examined on 09/23/2024 PCP: Charlott Dorn LABOR, MD  Patient coming from: Home  Chief Complaint:  Chief Complaint  Patient presents with   Leg Swelling   Abdominal Pain   HPI: Keith Espinoza is a 83 y.o. male with medical history significant of hypertension, hyperlipidemia, heart failure with reduced ejection fraction, coronary artery disease, diabetes mellitus type 2, peripheral artery disease status post right BKA, and CKD stage III who presents with leg swelling and abdominal pain.  He is accompanied by his daughter.  Patient was just recently hospitalized from 12/13-12/17 treated for right lower lobe pneumonia and rhinovirus for which during hospitalization Lasix  had been held in setting of soft blood pressure with infection.  He completed during hospitalization there was question of new baseline creatinine noted to be around 2 up from 1.4-1.6. He did not course of antibiotics for pneumonia, which he had last week.   He has persistent swelling in his leg, abdomen, and hands. He experiences occasional shooting pain in the stomach lasting less than ten seconds. He has a decreased appetite, feeling bloated and full after minimal food intake, such as three bites of breakfast.  His daughter was concerned a due to his history of congestive heart failure and notes that he has not been urinating much. He has been taking 20 mg of Lasix  at home without change in symptoms.  His daughter notes that he took 40 mg this morning prior to being evaluated at the emergency department..  He has a history of a right leg amputation and a femur fracture in the remaining leg, which has affected his balance and mobility. He is currently unable to walk without assistance and experiences weakness when standing.  In the emergency department patient was noted to be afebrile  with pulse of 80-1 18, respiration 17-26, other vital signs relatively maintained.  Labs noted hemoglobin 10.3, platelets 80, BUN 19, creatinine 2.55, glucose 199, protein BNP 20,065, and high-sensitivity troponin 132.  CT scanning of the chest abdomen pelvis was obtained which noted mild cardiomegaly with findings of interstitial edema, small to moderate right and trace left pleural effusions, mild mesenteric edema with small volume ascites and anasarca, and small nonobstructive bilateral renal calculi with no signs of hydronephrosis.  Doppler ultrasound of the lower extremity did not reveal any signs of a left lower leg DVT.  Review of Systems: As mentioned in the history of present illness. All other systems reviewed and are negative. Past Medical History:  Diagnosis Date   Allergic rhinitis    Anemia    Aortic stenosis    Arthritis    Basal cell carcinoma 11/01/2019    bcc left chest treatment TX [id number] [id number]    CHF (congestive heart failure) (HCC)    Chronic leg pain    right   Chronic lower back pain    Coronary artery disease    a. Stenting to RCA 2004; staged DES to LAD and Cx 2004. DES to [facility] 2012. b. DES to mCx, PTCA to dCx 11/2011. c. Lateral wall MI 2013 s/p PTCA to distal Cx & DES to mid OM2 11/2011. d. Low risk nuc 04/2014, EF wnl.   COVID-19    Diabetes mellitus    Insulin  dependent   Diabetic neuropathy (HCC)    MILD   Diverticulosis    Dysrhythmia    Bertrum syndrome    Gout  right wrist; right foot; right elbow; have had it since 1970's   H/O hiatal hernia    Heart murmur    History of echocardiogram    aortic sclerosis per echo 12/09 EF 65%, otherwise normal   History of hemorrhoids    BLEEDING   History of kidney stones    h/o   Hypertension    Diagnosed 1995    Myocardial infarction [facility])    Pancreatic pseudocyst    a. s/p remote drainage 2006.   Thrombocytopenia    Seen on oldest labs in system from 2004   Vitamin B 12 deficiency    orally replaced   Past  Surgical History:  Procedure Laterality Date   ABDOMINAL AORTOGRAM W/LOWER EXTREMITY Bilateral 08/08/2020   Procedure: ABDOMINAL AORTOGRAM W/LOWER EXTREMITY;  Surgeon: Eliza Lonni RAMAN, MD;  Location[facility] INVASIVE CV LAB;  Service: Cardiovascular;  Laterality: Bilateral;   AMPUTATION Right 12/12/2020   Procedure: RIGHT BELOW KNEE AMPUTATION;  Surgeon: Harden Jerona GAILS, MD;  Location: [facility] OR;  Service: Orthopedics;  Laterality: Right;   BACK SURGERY     total of 3 times S/P fall    CARPAL TUNNEL RELEASE Bilateral    CHOLECYSTECTOMY  1990's   CIRCUMCISION N/A 10/12/2023   Procedure: Dorsal slit;  Surgeon: [name], MD;  Location: [facility] ORS;  Service: Urology;  Laterality: N/A;   COLONOSCOPY     COLONOSCOPY Left 07/08/2023   Procedure: COLONOSCOPY;  Surgeon: San Sandor GAILS, DO;  Location: [facility] ENDOSCOPY;  Service: Gastroenterology;  Laterality: Left;   CORONARY ANGIOPLASTY  11/11/11   CORONARY ANGIOPLASTY WITH STENT PLACEMENT  09/30/2011   1 then; makes a total of 4   CORONARY ANGIOPLASTY WITH STENT PLACEMENT  11/11/11   1; makes a total of 5   ESOPHAGOGASTRODUODENOSCOPY Left 07/08/2023   Procedure: ESOPHAGOGASTRODUODENOSCOPY (EGD);  Surgeon: San Sandor GAILS, DO;  Location[facility] ENDOSCOPY;  Service: Gastroenterology;  Laterality: Left;   HEMOSTASIS CLIP PLACEMENT  07/08/2023   Procedure: HEMOSTASIS CLIP PLACEMENT;  Surgeon: San Sandor GAILS, DO;  Location: [facility] ENDOSCOPY;  Service: Gastroenterology;;   HOT HEMOSTASIS N/A 07/08/2023   Procedure: HOT HEMOSTASIS (ARGON PLASMA COAGULATION/BICAP);  Surgeon: San Sandor GAILS, DO;  Location: [facility] ENDOSCOPY;  Service: Gastroenterology;  Laterality: N/A;   INGUINAL HERNIA REPAIR  2003   right   JOINT REPLACEMENT Right 04/03/2002   hip replacment   KNEE ARTHROSCOPY  1990's   left   LEFT HEART CATH AND CORONARY ANGIOGRAPHY N/A 04/25/2023   Procedure: LEFT HEART CATH AND CORONARY ANGIOGRAPHY;  Surgeon: Anner Alm ORN, MD;  Location: [facility] INVASIVE  CV LAB;  Service: Cardiovascular;  Laterality: N/A;   LEFT HEART CATHETERIZATION WITH CORONARY ANGIOGRAM N/A 09/30/2011   Procedure: LEFT HEART CATHETERIZATION WITH CORONARY ANGIOGRAM;  Surgeon: Candyce RAMAN Reek, MD;  Location: [facility] CATH LAB;  Service: Cardiovascular;  Laterality: N/A;  possible PCI   LEFT HEART CATHETERIZATION WITH CORONARY ANGIOGRAM N/A 11/15/2011   Procedure: LEFT HEART CATHETERIZATION WITH CORONARY ANGIOGRAM;  Surgeon: Candyce RAMAN Reek, MD;  Location: [facility] CATH LAB;  Service: Cardiovascular;  Laterality: N/A;   PERCUTANEOUS CORONARY STENT INTERVENTION (PCI-S)  09/30/2011   Procedure: PERCUTANEOUS CORONARY STENT INTERVENTION (PCI-S);  Surgeon: Candyce RAMAN Reek, MD;  Location: [facility] CATH LAB;  Service: Cardiovascular;;   PERCUTANEOUS CORONARY STENT INTERVENTION (PCI-S) N/A 11/11/2011   Procedure: PERCUTANEOUS CORONARY STENT INTERVENTION (PCI-S);  Surgeon: Candyce RAMAN Reek, MD;  Location: [facility] CATH LAB;  Service: Cardiovascular;  Laterality: N/A;   PERIPHERAL VASCULAR BALLOON ANGIOPLASTY  Right 08/08/2020   Procedure: PERIPHERAL VASCULAR BALLOON ANGIOPLASTY;  Surgeon: Eliza Lonni RAMAN, MD;  Location: [facility] INVASIVE CV LAB;  Service: Cardiovascular;  Laterality: Right;  Posterior tibial    SHOULDER SURGERY Right    X 2   STUMP REVISION Right 01/09/2021   Procedure: REVISION RIGHT BELOW KNEE AMPUTATION;  Surgeon: Harden Jerona GAILS, MD;  Location: [facility] OR;  Service: Orthopedics;  Laterality: Right;   TONSILLECTOMY  ~ 1948   TOTAL HIP REVISION Right 04/13/2019   Procedure: RIGHT TOTAL HIP REVISION-POSTERIOR  APPROACH LATERAL;  Surgeon: Barbarann Oneil BROCKS, MD;  Location: [facility] OR;  Service: Orthopedics;  Laterality: Right;   TOTAL KNEE ARTHROPLASTY Left 07/23/2016   Procedure: LEFT TOTAL KNEE ARTHROPLASTY;  Surgeon: Oneil BROCKS Barbarann, MD;  Location: [facility] OR;  Service: Orthopedics;  Laterality: Left;   Social History:  reports that he has quit smoking. His smoking use included cigarettes. His smokeless  tobacco use includes chew. He reports that he does not drink alcohol and does not use drugs.  Allergies[1]  Family History  Problem Relation Age of Onset   Diabetes Mother    Hyperlipidemia Mother    Hypertension Mother    Cancer Father    Hypertension Father    Diabetes Sister    Hypertension Sister    Cancer Brother    Heart attack Neg Hx     Prior to Admission medications  Medication Sig Start Date End Date Taking? Authorizing Provider  acetaminophen  (TYLENOL ) 500 MG tablet Take 2 tablets (1,000 mg total) by mouth every 6 (six) hours. Patient taking differently: Take 1,000 mg by mouth daily as needed for mild pain (pain score 1-3) or moderate pain (pain score 4-6). 10/12/23   [name], MD  allopurinol  (ZYLOPRIM ) 100 MG tablet Take 0.5 tablets (50 mg total) by mouth every other day. 09/21/24 10/21/24  Regalado, Belkys A, MD  Ascorbic Acid (VITAMIN C PO) Take 1 tablet by mouth daily.    [provider]  Cholecalciferol  (VITAMIN D3) 50 MCG (2000 UT) TABS Take 2,000 Units by mouth daily.    [provider]  clopidogrel  (PLAVIX ) 75 MG tablet Take 1 tablet (75 mg total) by mouth daily. 09/21/24   [name], PA-C  Continuous Glucose Sensor (DEXCOM G7 SENSOR) MISC 1 Device by Does not apply route as directed. 09/10/24   [name], [name], MD  Cyanocobalamin  (B-12) 1000 MCG TABS Take 1,000 mcg by mouth daily.    [provider]  dicyclomine  (BENTYL ) 10 MG capsule Take 1 capsule (10 mg total) by mouth 3 (three) times daily as needed for spasms. 09/19/24 10/19/24  Regalado, Belkys A, MD  furosemide  (LASIX ) 20 MG tablet Take 1 tablet (20 mg total) by mouth daily as needed. 09/19/24   Regalado, Belkys A, MD  gabapentin  (NEURONTIN ) 300 MG capsule Take 1 capsule (300 mg total) by mouth at bedtime. 09/19/24 10/19/24  Regalado, Owen A, MD  guaiFENesin  (MUCINEX ) 600 MG 12 hr tablet Take 1 tablet (600 mg total) by mouth 2 (two) times daily. 09/19/24    Regalado, Belkys A, MD  insulin  aspart (NOVOLOG  FLEXPEN) 100 UNIT/ML FlexPen Max daily 30 units 06/08/24   [name], [name], MD  insulin  degludec (TRESIBA  FLEXTOUCH) 100 UNIT/ML FlexTouch Pen Inject 5 Units into the skin daily. 09/19/24   Regalado, Belkys A, MD  Insulin  Pen Needle 32G X 4 MM MISC 1 Device by Does not apply route in the morning, at noon, in the evening, and at bedtime. 06/08/24  [name], [name], MD  nitroGLYCERIN  (NITROSTAT ) 0.4 MG SL tablet Place 1 tablet (0.4 mg total) under the tongue every 5 (five) minutes x 3 doses as needed for chest pain. 04/16/24   [name], MD  Omega-3 Fatty Acids (FISH OIL) 500 MG CAPS Take 500 mg by mouth daily.    [provider]  pantoprazole  (PROTONIX ) 40 MG tablet Take 1 tablet (40 mg total) by mouth 2 (two) times daily. 09/19/24   Regalado, Belkys A, MD  rosuvastatin  (CRESTOR ) 20 MG tablet Take 1 tablet (20 mg total) by mouth daily. 07/27/24   [name], MD  senna-docusate (SENOKOT-S) 8.6-50 MG tablet Take 1 tablet by mouth at bedtime. Patient taking differently: Take 1 tablet by mouth 2 (two) times daily. 02/03/21   [name], MD  sucralfate  (CARAFATE ) 1 GM/10ML suspension Take 10 mLs (1 g total) by mouth 2 (two) times daily. 09/19/24   Regalado, Belkys A, MD  terazosin  (HYTRIN ) 5 MG capsule Take 5 mg by mouth 2 (two) times daily.    [provider]    Physical Exam: Vitals:   09/23/24 1111 09/23/24 1115 09/23/24 1200 09/23/24 1512  BP:  112/79 111/77 100/75  Pulse:    80  Resp:   (!) 26 17  Temp:    (!) 97.5 F (36.4 C)  TempSrc:    Oral  SpO2:    98%  Weight: 72.6 kg     Height: 5' 9 (1.753 m)   5' 9 (1.753 m)   Constitutional: Elderly male who appears chronically ill NAD, calm, comfortable Eyes: PERRL, lids and conjunctivae normal ENMT: Mucous membranes are moist.   Neck: normal, supple  Respiratory: Normal respiratory effort with some intermittent crackles noted in the lower lung fields..   Cardiovascular: Irregularly irregular.  At least 1+ pitting lower extremity edema present.. 2+ pedal pulses. No carotid bruits.  Abdomen: Mild abdominal distention.  No tenderness to palpation.  Bowel sounds positive.  Musculoskeletal: no clubbing / cyanosis.  Right below-knee amputation present. Skin: Stasis changes of the left lower extremity. Neurologic: CN 2-12 grossly intact.  Able to move all extremities Psychiatric: Normal judgment and insight. Alert and oriented x 3. Normal mood.   Data Reviewed:  EKG revealed atrial fibrillation at 92 bpm with PVC with QTc 513.  Reviewed labs, imaging, and pertinent records as documented.  Assessment and Plan:  Heart failure with reduced ejection fraction Acute on chronic.  Patient presents with complaints of abdominal pain and swelling.  found to have proBNP elevated at 20,065 which is significantly elevated from when checked back in August. CT scanning of the chest abdomen pelvis was obtained which noted mild cardiomegaly with findings of interstitial edema, small to moderate right and trace left pleural effusions, mild mesenteric edema with small volume ascites and anasarca.  Last echocardiogram noted EF to be 40 to 45% with grade 1 diastolic dysfunction when last checked 05/16/2024.  Patient had taken Lasix  40 mg p.o. morning and was given Lasix  60 mg IV x 1 dose while at med center. - Admit to a telemetry bed - Strict I&O's and daily weights - Plan to start Lasix  40 mg IV twice daily tomorrow morning.  Reassess and adjust diuresis as deemed medically appropriate - Consider need of formal cardiology evaluation if having issues with diuresis.  Elevated troponin Acute on chronic.  High-sensitivity troponin elevated at 132 which is higher than prior.  Patient denies any complaints of chest pain at this time.  EKG did not  appear to show any significant ischemic changes. - Continue to monitor  Acute kidney injury superimposed on chronic kidney  disease stage III Patient presents with creatinine elevated up to 2.55 with BUN 19.  Baseline creatinine previously had been around 1.4-1.6.  CT imaging did not reveal any signs of hydronephrosis and small bilateral nonobstructive nephrolithiasis. - Avoid nephrotoxic agents - Monitor kidney function with diuresis  Abdominal pain Patient still reports having intermittent abdominal pains lasting 10 seconds.  CT scan of the abdomen pelvis noted signs of ascites and bilateral nonobstructing nephrolithiasis but no other clear cause for patient's symptoms. - Continue PPI, Carafate , and Bentyl  as needed  Diabetes mellitus type 2 with hyperglycemia with long-term use of insulin  On admission glucose noted to be 199.  Last hemoglobin A1c noted to be 7.9 when checked on 06/08/2024. - Hypoglycemic protocols - Continue pharmacy substitution for Tresiba  - CBGs before every meal with sensitive SSI - Adjust diabetes regimen as deemed medically appropriate  Possible dysphagia Nursing staff noted patient to be coughing while attempting to eat after getting to the room.  Question the possibility of patient aspirating would put him at increased risk for pneumonia. - Aspiration precautions - Speech therapy consulted to eval  Thrombocytopenia Chronic.  Platelet count noted to be 80 which appears similar to priors. - Continue to monitor  Coronary artery disease Peripheral vascular disease Hyperlipidemia Aspirin  was held during last hospitalization due to thrombocytopenia - Continue Plavix  and Crestor    Paroxysmal atrial fibrillation Patient had previously been on dual antiplatelet therapy, but currently just continued on Plavix  due to thrombocytopenia not on any rate controlling medications at this time.  Weakness Patient reports significant week with issues with his balance unable to ambulate like prior. - PT/OT to eval and treat  Elevated liver enzymes Possibly chronic.  Thought possibly secondary to  passive congestion in the setting of patient being acutely fluid overloaded. - Continue to monitor  BPH - Continue terazosin   GERD - Continue Protonix  and Carafate   Tobacco abuse - Continue to counsel need of cessation of tobacco use  DVT prophylaxis: SCDs Advance Care Planning:   Code Status: Full Code **does not want to be intubated **  Consults: None  Family Communication: Updated at bedside Severity of Illness: The appropriate patient status for this patient is INPATIENT. Inpatient status is judged to be reasonable and necessary in order to provide the required intensity of service to ensure the patient's safety. The patient's presenting symptoms, physical exam findings, and initial radiographic and laboratory data in the context of their chronic comorbidities is felt to place them at high risk for further clinical deterioration. Furthermore, it is not anticipated that the patient will be medically stable for discharge from the hospital within 2 midnights of admission.   * I certify that at the point of admission it is my clinical judgment that the patient will require inpatient hospital care spanning beyond 2 midnights from the point of admission due to high intensity of service, high risk for further deterioration and high frequency of surveillance required.*  Author: Maximino DELENA Sharps, MD 09/23/2024 3:43 PM  For on call review [email or web address].      [1]  Allergies Allergen Reactions   Simvastatin Other (See Comments)    SEVERE MYALGIAS    Zetia [Ezetimibe] Other (See Comments)    MYALGIAS   Dilaudid  [Hydromorphone  Hcl] Other (See Comments)    hallucination   "

## 2024-09-23 NOTE — Plan of Care (Addendum)
 [facility] transferred  Keith Espinoza is a 83 year old  male with pmh CAD s/p stents, chronic HFrEF, HTN, HLD, DM type II, CKD presents with leg swelling and abdominal pain.  Just recently hospitalized from 12/13-12/17 treated for right lower lobe pneumonia and rhinovirus for which during hospitalization Lasix  have been held in setting of soft blood pressure with infection.  During hospitalization there was question of new baseline creatinine noted to be around 2 up from 1.4-1.6.  Thought to be fluid overloaded with proBNP greater than 20,000.  CT imaging small bilateral pleural effusions with dependent bibasilar atelectasis and diffuse coronary artery disease.  CT scan of the abdomen pelvis noted nephrolithiasis without hydronephrosis.  Patient was given Lasix  60 mg IV suspecting patient to be acutely fluid overloaded.  Will need to monitor kidney function with diuresis.

## 2024-09-23 NOTE — ED Provider Notes (Signed)
 " [place] EMERGENCY DEPARTMENT AT [facility] Provider Note   CSN: [id number] Arrival date & time: 09/23/24  1104     Patient presents with: Leg Swelling and Abdominal Pain   Keith Espinoza is a 83 y.o. male.   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  Still has decreased appetite some abdominal discomfort at times but they held his Lasix  always in the hospital he is to resume this but noticed some swelling of the left leg.  Denies any chest pain shortness of breath but has had some discomfort in his upper chest abdomen sometimes when he eats.  Denies any fever chills cough sputum production.  The history is provided by the patient.       Prior to Admission medications  Medication Sig Start Date End Date Taking? Authorizing Provider  acetaminophen  (TYLENOL ) 500 MG tablet Take 2 tablets (1,000 mg total) by mouth every 6 (six) hours. Patient taking differently: Take 1,000 mg by mouth daily as needed for mild pain (pain score 1-3) or moderate pain (pain score 4-6). 10/12/23   [name], MD  allopurinol  (ZYLOPRIM ) 100 MG tablet Take 0.5 tablets (50 mg total) by mouth every other day. 09/21/24 10/21/24  Regalado, Belkys A, MD  Ascorbic Acid (VITAMIN C PO) Take 1 tablet by mouth daily.    [provider]  Cholecalciferol  (VITAMIN D3) 50 MCG (2000 UT) TABS Take 2,000 Units by mouth daily.    [provider]  clopidogrel  (PLAVIX ) 75 MG tablet Take 1 tablet (75 mg total) by mouth daily. 09/21/24   [name], PA-C  Continuous Glucose Sensor (DEXCOM G7 SENSOR) MISC 1 Device by Does not apply route as directed. 09/10/24   [name], [name], MD  Cyanocobalamin  (B-12) 1000 MCG TABS Take 1,000 mcg by mouth daily.    [provider]  dicyclomine  (BENTYL ) 10 MG capsule Take 1 capsule (10 mg total) by mouth 3 (three) times daily as needed for spasms. 09/19/24  10/19/24  Regalado, Owen A, MD  furosemide  (LASIX ) 20 MG tablet Take 1 tablet (20 mg total) by mouth daily as needed. 09/19/24   Regalado, Belkys A, MD  gabapentin  (NEURONTIN ) 300 MG capsule Take 1 capsule (300 mg total) by mouth at bedtime. 09/19/24 10/19/24  Regalado, Belkys A, MD  guaiFENesin  (MUCINEX ) 600 MG 12 hr tablet Take 1 tablet (600 mg total) by mouth 2 (two) times daily. 09/19/24   Regalado, Belkys A, MD  insulin  aspart (NOVOLOG  FLEXPEN) 100 UNIT/ML FlexPen Max daily 30 units 06/08/24   [name], [name], MD  insulin  degludec (TRESIBA  FLEXTOUCH) 100 UNIT/ML FlexTouch Pen Inject 5 Units into the skin daily. 09/19/24   Regalado, Belkys A, MD  Insulin  Pen Needle 32G X 4 MM MISC 1 Device by Does not apply route in the morning, at noon, in the evening, and at bedtime. 06/08/24   [name], [name], MD  nitroGLYCERIN  (NITROSTAT ) 0.4 MG SL tablet Place 1 tablet (0.4 mg total) under the tongue every 5 (five) minutes x 3 doses as needed for chest pain. 04/16/24   [name], MD  Omega-3 Fatty Acids (FISH OIL) 500 MG CAPS Take 500 mg by mouth daily.    [provider]  pantoprazole  (PROTONIX ) 40 MG tablet Take 1 tablet (40 mg total) by mouth 2 (two) times daily. 09/19/24   Regalado, Belkys A, MD  rosuvastatin  (CRESTOR ) 20 MG tablet Take 1 tablet (20 mg total)  by mouth daily. 07/27/24   [name], MD  senna-docusate (SENOKOT-S) 8.6-50 MG tablet Take 1 tablet by mouth at bedtime. Patient taking differently: Take 1 tablet by mouth 2 (two) times daily. 02/03/21   [name], MD  sucralfate  (CARAFATE ) 1 GM/10ML suspension Take 10 mLs (1 g total) by mouth 2 (two) times daily. 09/19/24   Regalado, Belkys A, MD  terazosin  (HYTRIN ) 5 MG capsule Take 5 mg by mouth 2 (two) times daily.    [provider]    Allergies: Simvastatin, Zetia [ezetimibe], and Dilaudid  [hydromorphone  hcl]    Review of Systems  Updated Vital Signs BP 111/77   Pulse (!) 118   Temp 97.7 F  (36.5 C) (Oral)   Resp (!) 26   Ht 5' 9 (1.753 m)   Wt 72.6 kg   BMI 23.63 kg/m   Physical Exam Vitals and nursing note reviewed.  Constitutional:      General: He is not in acute distress.    Appearance: He is well-developed.  HENT:     Head: Normocephalic and atraumatic.  Eyes:     Extraocular Movements: Extraocular movements intact.     Conjunctiva/sclera: Conjunctivae normal.  Cardiovascular:     Rate and Rhythm: Normal rate and regular rhythm.     Heart sounds: Normal heart sounds. No murmur heard. Pulmonary:     Effort: Pulmonary effort is normal. No respiratory distress.     Breath sounds: Normal breath sounds.  Abdominal:     Palpations: Abdomen is soft.     Tenderness: There is abdominal tenderness.  Musculoskeletal:        General: No swelling.     Cervical back: Neck supple.  Skin:    General: Skin is warm and dry.     Capillary Refill: Capillary refill takes less than 2 seconds.     Comments: Pitting edema to his left lower extremity with no cellulitic changes  Neurological:     Mental Status: He is alert.  Psychiatric:        Mood and Affect: Mood normal.     (all labs ordered are listed, but only abnormal results are displayed) Labs Reviewed  CBC WITH DIFFERENTIAL/PLATELET - Abnormal; Notable for the following components:      Result Value   RBC 3.25 (*)    Hemoglobin 10.3 (*)    HCT 31.6 (*)    RDW 16.2 (*)    Platelets 80 (*)    Eosinophils Absolute 1.4 (*)    All other components within normal limits  COMPREHENSIVE METABOLIC PANEL WITH GFR - Abnormal; Notable for the following components:   Glucose, Bld 189 (*)    Creatinine, Ser 2.55 (*)    Total Protein 6.4 (*)    AST 45 (*)    ALT 89 (*)    GFR, Estimated 24 (*)    All other components within normal limits  PRO BRAIN NATRIURETIC PEPTIDE - Abnormal; Notable for the following components:   Pro Brain Natriuretic Peptide 20,065.0 (*)    All other components within normal limits   URINALYSIS, ROUTINE W REFLEX MICROSCOPIC - Abnormal; Notable for the following components:   Color, Urine COLORLESS (*)    Glucose, UA >1,000 (*)    Hgb urine dipstick SMALL (*)    Protein, ur 30 (*)    Bacteria, UA RARE (*)    All other components within normal limits  TROPONIN T, HIGH SENSITIVITY - Abnormal; Notable for the following components:   Troponin T High  Sensitivity 132 (*)    All other components within normal limits  RESP PANEL BY RT-PCR (RSV, FLU A&B, COVID)  RVPGX2  LIPASE, BLOOD    EKG: EKG Interpretation Date/Time:  Sunday September 23 2024 11:20:23 EST Ventricular Rate:  92 PR Interval:    QRS Duration:  152 QT Interval:  414 QTC Calculation: 513 R Axis:   107  Text Interpretation: nsr Ventricular premature complex Confirmed by Ruthe Cornet [phone]) on 09/23/2024 11:34:20 AM  Radiology: US  Venous Img Lower  Left (DVT Study) Result Date: 09/23/2024 CLINICAL DATA:  Swelling. EXAM: LEFT LOWER EXTREMITY VENOUS DOPPLER ULTRASOUND TECHNIQUE: Gray-scale sonography with compression, as well as color and duplex ultrasound, were performed to evaluate the deep venous system(s) from the level of the common femoral vein through the popliteal and proximal calf veins. COMPARISON:  None Available. FINDINGS: VENOUS Normal compressibility of the common femoral, superficial femoral, and popliteal veins, as well as the visualized calf veins. Visualized portions of profunda femoral vein and great saphenous vein unremarkable. No filling defects to suggest DVT on grayscale or color Doppler imaging. Doppler waveforms show normal direction of venous flow, normal respiratory plasticity and response to augmentation. Limited views of the contralateral common femoral vein are unremarkable. OTHER Subcutaneous edema. Limitations: Soft tissue edema limits assessment of the calf veins. IMPRESSION: No evidence of left lower extremity DVT. Electronically Signed   By: Andrea Gasman M.D.   On:  09/23/2024 12:29   CT CHEST ABDOMEN PELVIS WO CONTRAST Result Date: 09/23/2024 EXAM: CT CHEST, ABDOMEN AND PELVIS WITHOUT CONTRAST 09/23/2024 11:29:26 AM TECHNIQUE: CT of the chest, abdomen and pelvis was performed without the administration of intravenous contrast. Multiplanar reformatted images are provided for review. Automated exposure control, iterative reconstruction, and/or weight based adjustment of the mA/kV was utilized to reduce the radiation dose to as low as reasonably achievable. COMPARISON: None available. CLINICAL HISTORY: Cough and pain. FINDINGS: CHEST: MEDIASTINUM AND LYMPH NODES: Heart and pericardium are unremarkable. The central airways are clear. No mediastinal, hilar or axillary lymphadenopathy. LUNGS AND PLEURA: Mild intralobular septal thickening scattered throughout the lungs. Small to moderate volume right pleural effusion with compressive atelectasis. Trace left pleural effusion. Pulmonary cystectomy. No focal consolidation or pulmonary edema. No pneumothorax. ABDOMEN AND PELVIS: LIVER: The liver is unremarkable. GALLBLADDER AND BILE DUCTS: Gallbladder is unremarkable. No biliary ductal dilatation. SPLEEN: No acute abnormality. PANCREAS: No acute abnormality. ADRENAL GLANDS: 0.8 cm left adrenal nodule, likely an adrenal adenoma. KIDNEYS, URETERS AND BLADDER: Mild renal cortical atrophy of both kidneys. Multiple small nonobstructive calyceal calculi. No hydronephrosis. No perinephric or periureteral stranding. The urinary bladder is distended, without focal abnormality. GI AND BOWEL: Stomach demonstrates no acute abnormality. Scattered colonic diverticulosis. Normal decompressed gas filled appendix, best visualized on the coronal images. There is no bowel obstruction. REPRODUCTIVE ORGANS: Moderate prostatomegaly. PERITONEUM AND RETROPERITONEUM: Mesenteric edema with small volume ascites. No free air. VASCULATURE: Extensive, multivessel coronary atherosclerosis. Diffuse calcified  atherosclerosis throughout the aorta and great vessels. Diffuse aortoiliac atherosclerosis with calcified atherosclerosis throughout the mesenteric vessels. ABDOMINAL AND PELVIS LYMPH NODES: No lymphadenopathy. BONES AND SOFT TISSUES: Diffuse osteopenia. Rotator cuff anchors are present in the right humeral head. Right hip arthroplasty is anatomically aligned without dislocation. Multilevel degenerative disc disease of the spine. Mild diffuse anasarca. IMPRESSION: 1. Mild cardiomegaly with findings of minimal interstitial edema. Small to moderate right and trace left pleural effusions. 2. Mild mesenteric edema, small volume ascites, and anasarca, also likely related to the patient's volume status. 3. Small nonobstructive  bilateral renal calculi. No hydronephrosis in the kidney. 4. Scattered colonic diverticulosis. No changes of acute diverticulitis. Electronically signed by: Rogelia Myers MD 09/23/2024 12:03 PM EST RP Workstation: [id number]     Procedures   Medications Ordered in the ED  furosemide  (LASIX ) injection 60 mg (has no administration in time range)                                    Medical Decision Making Amount and/or Complexity of Data Reviewed Labs: ordered. Radiology: ordered.  Risk Prescription drug management. Decision regarding hospitalization.   Jquan Egelston is here with generalized weakness abdominal pain and left lower leg swelling.  Has a BKA on the right.  EKG shows sinus rhythm with PVCs.  He just was discharged from the hospital with pneumonia few days ago.  Appetites been decreased.  He has some abdominal pain when he eats at times.  Sometimes radiates up into his chest.  He has noticed some left lower leg swelling and he held his Lasix  while he was in the hospital but per my chart review he had AKI hypotension and was treated for pneumonia but was positive for rhinovirus.  He has unremarkable vitals here.  He is somewhat chronically ill-appearing but my  suspicion is that he is just likely recovering from his hospitalization but will rule out blood clot in the left lower leg will rule out any worsening renal failure liver failure or heart failure with labs.  Seems unlikely to be ACS but will get troponin.  Will get a CT scan of his chest abdomen and pelvis to further evaluate for infectious process as well.  CT scan of the chest abdomen and pelvis consistent with volume overload.  Negative for flu.  proBNP is 20,000.  Creatinine stable at 2.55.  Troponin is elevated to 132.  I do think he is volume overloaded.  Will give him a dose of IV Lasix  and admit him to hospitalist for further diuresis.  Given recent AKI think he needs close monitoring of this.  DVT study was negative.  Will admit to medicine for further volume overload care.  This chart was dictated using voice recognition software.  Despite best efforts to proofread,  errors can occur which can change the documentation meaning.      Final diagnoses:  Anasarca  Hypervolemia, unspecified hypervolemia type  Acute heart failure, unspecified heart failure type [facility])    ED Discharge Orders     None          Ruthe Cornet, DO 09/23/24 1309  "

## 2024-09-23 NOTE — ED Notes (Signed)
Keith Espinoza with cl called for transport

## 2024-09-24 ENCOUNTER — Encounter (HOSPITAL_COMMUNITY): Payer: Self-pay | Admitting: Internal Medicine

## 2024-09-24 DIAGNOSIS — I5020 Unspecified systolic (congestive) heart failure: Secondary | ICD-10-CM | POA: Diagnosis not present

## 2024-09-24 LAB — GLUCOSE, CAPILLARY
Glucose-Capillary: 198 mg/dL — ABNORMAL HIGH (ref 70–99)
Glucose-Capillary: 211 mg/dL — ABNORMAL HIGH (ref 70–99)
Glucose-Capillary: 251 mg/dL — ABNORMAL HIGH (ref 70–99)
Glucose-Capillary: 255 mg/dL — ABNORMAL HIGH (ref 70–99)

## 2024-09-24 LAB — CBC
HCT: 28.1 % — ABNORMAL LOW (ref 39.0–52.0)
Hemoglobin: 9.2 g/dL — ABNORMAL LOW (ref 13.0–17.0)
MCH: 31.3 pg (ref 26.0–34.0)
MCHC: 32.7 g/dL (ref 30.0–36.0)
MCV: 95.6 fL (ref 80.0–100.0)
Platelets: 72 K/uL — ABNORMAL LOW (ref 150–400)
RBC: 2.94 MIL/uL — ABNORMAL LOW (ref 4.22–5.81)
RDW: 15.9 % — ABNORMAL HIGH (ref 11.5–15.5)
WBC: 6.2 K/uL (ref 4.0–10.5)
nRBC: 0.5 % — ABNORMAL HIGH (ref 0.0–0.2)

## 2024-09-24 LAB — BASIC METABOLIC PANEL WITH GFR
Anion gap: 13 (ref 5–15)
BUN: 19 mg/dL (ref 8–23)
CO2: 27 mmol/L (ref 22–32)
Calcium: 9.1 mg/dL (ref 8.9–10.3)
Chloride: 100 mmol/L (ref 98–111)
Creatinine, Ser: 2.5 mg/dL — ABNORMAL HIGH (ref 0.61–1.24)
GFR, Estimated: 25 mL/min — ABNORMAL LOW
Glucose, Bld: 150 mg/dL — ABNORMAL HIGH (ref 70–99)
Potassium: 2.9 mmol/L — ABNORMAL LOW (ref 3.5–5.1)
Sodium: 139 mmol/L (ref 135–145)

## 2024-09-24 LAB — MAGNESIUM: Magnesium: 1.5 mg/dL — ABNORMAL LOW (ref 1.7–2.4)

## 2024-09-24 MED ORDER — ENSURE PLUS HIGH PROTEIN PO LIQD
237.0000 mL | Freq: Two times a day (BID) | ORAL | Status: DC
  Administered 2024-09-24 – 2024-09-25 (×2): 237 mL via ORAL

## 2024-09-24 MED ORDER — MAGNESIUM SULFATE 2 GM/50ML IV SOLN
2.0000 g | Freq: Once | INTRAVENOUS | Status: AC
  Administered 2024-09-24: 2 g via INTRAVENOUS
  Filled 2024-09-24: qty 50

## 2024-09-24 MED ORDER — POTASSIUM CHLORIDE 20 MEQ PO PACK
40.0000 meq | PACK | ORAL | Status: AC
  Administered 2024-09-24 (×4): 40 meq via ORAL
  Filled 2024-09-24 (×4): qty 2

## 2024-09-24 NOTE — Progress Notes (Signed)
 SLP Cancellation Note  Patient Details Name: Rodriques Badie MRN: [id number] DOB: 04-24-1941   Cancelled treatment:       Reason Eval/Treat Not Completed: Patient declined, no reason specified;pt continues to decline MBS; ST will s/o at this time.   Pat Del Wiseman,M.S.,CCC-SLP 09/24/2024, 10:20 AM

## 2024-09-24 NOTE — Progress Notes (Signed)
 " PROGRESS NOTE    Keith Espinoza  FMW:[id number] DOB: 06-Aug-1941 DOA: 09/23/2024 PCP: Charlott Dorn LABOR, MD   Brief Narrative:   Keith Espinoza is a 83 y.o. male with medical history significant of hypertension, hyperlipidemia, heart failure with reduced ejection fraction, coronary artery disease, diabetes mellitus type 2, peripheral artery disease status post right BKA, and CKD stage III who presents with leg swelling and abdominal pain.    Patient was just recently hospitalized from 12/13-12/17 treated for right lower lobe pneumonia and rhinovirus for which during hospitalization Lasix  had been held in setting of soft blood pressure with infection.  He completed during hospitalization there was question of new baseline creatinine noted to be around 2 up from 1.4-1.6.   He has persistent swelling in his leg, abdomen, and hands. He experiences occasional shooting pain in the stomach lasting less than ten seconds. He has a decreased appetite, feeling bloated and full after minimal food intake, such as three bites of breakfast.  His daughter was concerned a due to his history of congestive heart failure and notes that he has not been urinating much. He has been taking 20 mg of Lasix  at home without change in symptoms.  His daughter notes that he took 40 mg this morning prior to being evaluated at the emergency department..   He has a history of a right leg amputation and a femur fracture in the remaining leg, which has affected his balance and mobility. He is currently unable to walk without assistance and experiences weakness when standing.   In the emergency department patient was noted to be afebrile with pulse of 80-1 18, respiration 17-26, other vital signs relatively maintained.  Labs noted hemoglobin 10.3, platelets 80, BUN 19, creatinine 2.55, glucose 199, protein BNP 20,065, and high-sensitivity troponin 132.  CT scanning of the chest abdomen pelvis was obtained which noted  mild cardiomegaly with findings of interstitial edema, small to moderate right and trace left pleural effusions, mild mesenteric edema with small volume ascites and anasarca, and small nonobstructive bilateral renal calculi with no signs of hydronephrosis.  Doppler ultrasound of the lower extremity did not reveal any signs of a left lower leg DVT.  Patient admitted for worsening lower extremity edema likely CHF exacerbation.  Assessment & Plan:   Principal Problem:   HFrEF (heart failure with reduced ejection fraction) (HCC) Active Problems:   Elevated troponin   Acute kidney injury superimposed on chronic kidney disease   Abdominal pain   Type 2 diabetes mellitus with hyperglycemia, with long-term current use of insulin  (HCC)   Dysphagia   Thrombocytopenia   Hyperlipidemia   Coronary artery disease   Paroxysmal A-fib (HCC)   Weakness   Elevated liver enzymes   Enlarged prostate   Esophageal reflux   Tobacco abuse   Heart failure with reduced ejection fraction  CT scanning of the chest abdomen pelvis was obtained which noted mild cardiomegaly with findings of interstitial edema, small to moderate right and trace left pleural effusions, mild mesenteric edema with small volume ascites and anasarca.   Last echocardiogram noted EF to be 40 to 45% with grade 1 diastolic dysfunction when last checked 05/16/2024.  Patient diuresing well with IV Lasix  twice daily, will continue it for now Reassess in the AM. Will monitor electrolytes and kidney function.  Hypokalemia, hypomagnesemia Potassium is 2.9, magnesium  1.5. Will replete aggressively  Elevated troponin Acute on chronic.  High-sensitivity troponin elevated at 132 which is higher than prior.  Patient denies  any complaints of chest pain at this time.  EKG did not appear to show any significant ischemic changes. - Continue to monitor   Acute kidney injury superimposed on chronic kidney disease stage III Patient presents with  creatinine elevated up to 2.55 with BUN 19.  Baseline creatinine previously had been around 1.4-1.6.  CT imaging did not reveal any signs of hydronephrosis and small bilateral nonobstructive nephrolithiasis. - Avoid nephrotoxic agents - Monitor kidney function with diuresis   Abdominal pain Symptoms improved.- Continue PPI, Carafate , and Bentyl  as needed   Diabetes mellitus type 2 with hyperglycemia with long-term use of insulin  On admission glucose noted to be 199.  Last hemoglobin A1c noted to be 7.9 when checked on 06/08/2024. - Hypoglycemic protocols - Continue pharmacy substitution for Tresiba  - CBGs before every meal with sensitive SSI - Adjust diabetes regimen as deemed medically appropriate  Possible dysphagia Nursing staff noted patient to be coughing while attempting to eat after getting to the room.  Question the possibility of patient aspirating would put him at increased risk for pneumonia. - Aspiration precautions - Speech therapy consulted to eval   Thrombocytopenia Chronic.  Platelet count noted to be 80 which appears similar to priors. - Continue to monitor   Coronary artery disease Peripheral vascular disease Hyperlipidemia Aspirin  was held during last hospitalization due to thrombocytopenia - Continue Plavix  and Crestor     Paroxysmal atrial fibrillation Patient had previously been on dual antiplatelet therapy, but currently just continued on Plavix  due to thrombocytopenia not on any rate controlling medications at this time.   Weakness Patient reports significant week with issues with his balance unable to ambulate like prior. - PT/OT to eval and treat   Elevated liver enzymes Possibly chronic.  Thought possibly secondary to passive congestion in the setting of patient being acutely fluid overloaded. - Continue to monitor   BPH - Continue terazosin    GERD - Continue Protonix  and Carafate    Tobacco abuse - Continue to counsel need of cessation of tobacco  use   DVT prophylaxis: SCDs Advance Care Planning:   Code Status: Full Code **does not want to be intubated **   Consults: None   Family Communication:  Severity of Illness: The appropriate patient status for this patient is INPATIENT. Inpatient status is judged to be reasonable and necessary in order to provide the required intensity of service to ensure the patient's safety. The patient's presenting symptoms, physical exam findings, and initial radiographic and laboratory data in the context of their chronic comorbidities is felt to place them at high risk for further clinical deterioration. Furthermore, it is not anticipated that the patient will be medically stable for discharge from the hospital within 2 midnights of admission.    * I certify that at the point of admission it is my clinical judgment that the patient will require inpatient hospital care spanning beyond 2 midnights from the point of admission due to high intensity of service, high risk for further deterioration and high frequency of surveillance required.*     Subjective:  Patient seen and examined at bedside.  Denies any trouble breathing.  He is not needing any supplemental oxygen.  Left lower extremity edema is improving.  Denies pain Objective: Vitals:   09/24/24 0504 09/24/24 0700 09/24/24 0805 09/24/24 1100  BP:  (!) 103/59 106/72 91/60  Pulse:      Resp: 18 18 16 17   Temp: 98.1 F (36.7 C) 98.5 F (36.9 C) 98.5 F (36.9 C) 97.6 F (36.4  C)  TempSrc: Oral Oral Oral Axillary  SpO2:   98%   Weight: 66.5 kg     Height:        Intake/Output Summary (Last 24 hours) at 09/24/2024 1452 Last data filed at 09/24/2024 0500 Gross per 24 hour  Intake --  Output 2600 ml  Net -2600 ml   Filed Weights   09/23/24 1111 09/24/24 0504  Weight: 72.6 kg 66.5 kg    Examination:  Constitutional: Elderly male who appears chronically ill NAD, calm, comfortable Eyes: PERRL, lids and conjunctivae normal ENMT: Mucous  membranes are moist.   Neck: normal, supple  Respiratory: Normal respiratory effort with some intermittent crackles noted in the lower lung fields..  Cardiovascular: Irregularly irregular.  At least 1+ pitting lower extremity edema present.. 2+ pedal pulses. No carotid bruits.  Abdomen: Mild abdominal distention.  No tenderness to palpation.  Bowel sounds positive.  Musculoskeletal: no clubbing / cyanosis.  Right below-knee amputation present. Skin: Stasis changes of the left lower extremity. Neurologic: CN 2-12 grossly intact.  Able to move all extremities Psychiatric: Normal judgment and insight. Alert and oriented x 3. Normal mood.    Data Reviewed: I have personally reviewed following labs and imaging studies  CBC: Recent Labs  Lab 09/18/24 0514 09/19/24 0525 09/23/24 1117 09/24/24 0512  WBC 6.3 5.7 5.7 6.2  NEUTROABS  --   --  2.8  --   HGB 10.1* 9.6* 10.3* 9.2*  HCT 32.0* 30.1* 31.6* 28.1*  MCV 100.0 100.0 97.2 95.6  PLT 84* 81* 80* 72*   Basic Metabolic Panel: Recent Labs  Lab 09/18/24 0514 09/19/24 0525 09/23/24 1117 09/24/24 0512  NA 135 134* 140 139  K 4.6 4.1 3.5 2.9*  CL 100 98 100 100  CO2 20* 20* 25 27  GLUCOSE 171* 247* 189* 150*  BUN 32* 32* 19 19  CREATININE 2.29* 2.39* 2.55* 2.50*  CALCIUM  9.1 8.8* 9.7 9.1  MG  --   --   --  1.5*   GFR: Estimated Creatinine Clearance: 20.7 mL/min (A) (by C-G formula based on SCr of 2.5 mg/dL (H)). Liver Function Tests: Recent Labs  Lab 09/23/24 1117  AST 45*  ALT 89*  ALKPHOS 95  BILITOT 0.5  PROT 6.4*  ALBUMIN  3.7   Recent Labs  Lab 09/23/24 1117  LIPASE 42   No results for input(s): AMMONIA in the last 168 hours. Coagulation Profile: No results for input(s): INR, PROTIME in the last 168 hours. Cardiac Enzymes: No results for input(s): CKTOTAL, CKMB, CKMBINDEX, TROPONINI in the last 168 hours. BNP (last 3 results) Recent Labs    05/14/24 1416 09/23/24 1117  PROBNP 1,468.0*  20,065.0*   HbA1C: No results for input(s): HGBA1C in the last 72 hours. CBG: Recent Labs  Lab 09/19/24 1131 09/23/24 1731 09/23/24 2104 09/24/24 0800 09/24/24 1201  GLUCAP 211* 139* 160* 198* 211*   Lipid Profile: No results for input(s): CHOL, HDL, LDLCALC, TRIG, CHOLHDL, LDLDIRECT in the last 72 hours. Thyroid  Function Tests: No results for input(s): TSH, T4TOTAL, FREET4, T3FREE, THYROIDAB in the last 72 hours. Anemia Panel: No results for input(s): VITAMINB12, FOLATE, FERRITIN, TIBC, IRON , RETICCTPCT in the last 72 hours. Sepsis Labs: No results for input(s): PROCALCITON, LATICACIDVEN in the last 168 hours.  Recent Results (from the past 240 hours)  Blood Culture (routine x 2)     Status: None   Collection Time: 09/15/24 12:49 PM   Specimen: BLOOD  Result Value Ref Range Status   Specimen Description  Final    BLOOD LEFT ANTECUBITAL Performed at [facility], [address] [place], [place] [address]    Special Requests   Final    BOTTLES DRAWN AEROBIC AND ANAEROBIC Blood Culture adequate volume Performed at [facility], [address], [place], [place] [address]    Culture   Final    NO GROWTH 5 DAYS Performed at [facility], [address], [place], [place] [address]    Report Status 09/20/2024 FINAL  Final  Blood Culture (routine x 2)     Status: None   Collection Time: 09/15/24 12:49 PM   Specimen: BLOOD  Result Value Ref Range Status   Specimen Description   Final    BLOOD RIGHT ANTECUBITAL Performed at [facility], [address] [place], [place] [address]    Special Requests   Final    BOTTLES DRAWN AEROBIC AND ANAEROBIC Blood Culture adequate volume Performed at [facility], [address], [place], [place] [address]    Culture   Final    NO GROWTH 5 DAYS Performed at [facility], [address]  [place], [place] [address]    Report Status 09/20/2024 FINAL  Final  Resp panel by RT-PCR (RSV, Flu A&B, Covid) Peripheral     Status: None   Collection Time: 09/15/24 12:51 PM   Specimen: Peripheral; Nasal Swab  Result Value Ref Range Status   SARS Coronavirus 2 by RT PCR NEGATIVE NEGATIVE Final    Comment: (NOTE) SARS-CoV-2 target nucleic acids are NOT DETECTED.  The SARS-CoV-2 RNA is generally detectable in upper respiratory specimens during the acute phase of infection. The lowest concentration of SARS-CoV-2 viral copies this assay can detect is 138 copies/mL. A negative result does not preclude SARS-Cov-2 infection and should not be used as the sole basis for treatment or other patient management decisions. A negative result may occur with  improper specimen collection/handling, submission of specimen other than nasopharyngeal swab, presence of viral mutation(s) within the areas targeted by this assay, and inadequate number of viral copies(<138 copies/mL). A negative result must be combined with clinical observations, patient history, and epidemiological information. The expected result is Negative.  Fact Sheet for Patients:  bloggercourse.com  Fact Sheet for Healthcare Providers:  seriousbroker.it  This test is no t yet approved or cleared by the [place]  FDA and  has been authorized for detection and/or diagnosis of SARS-CoV-2 by FDA under an Emergency Use Authorization (EUA). This EUA will remain  in effect (meaning this test can be used) for the duration of the COVID-19 declaration under Section 564(b)(1) of the Act, 21 U.S.C.section 360bbb-3(b)(1), unless the authorization is terminated  or revoked sooner.       Influenza A by PCR NEGATIVE NEGATIVE Final   Influenza B by PCR NEGATIVE NEGATIVE Final    Comment: (NOTE) The Xpert Xpress SARS-CoV-2/FLU/RSV plus assay is intended as an aid in the diagnosis of influenza  from Nasopharyngeal swab specimens and should not be used as a sole basis for treatment. Nasal washings and aspirates are unacceptable for Xpert Xpress SARS-CoV-2/FLU/RSV testing.  Fact Sheet for Patients: bloggercourse.com  Fact Sheet for Healthcare Providers: seriousbroker.it  This test is not yet approved or cleared by the [place]  FDA and has been authorized for detection and/or diagnosis of SARS-CoV-2 by FDA under an Emergency Use Authorization (EUA). This EUA will remain in effect (meaning this test can be used) for the duration of the COVID-19 declaration under Section  564(b)(1) of the Act, 21 U.S.C. section 360bbb-3(b)(1), unless the authorization is terminated or revoked.     Resp Syncytial Virus by PCR NEGATIVE NEGATIVE Final    Comment: (NOTE) Fact Sheet for Patients: bloggercourse.com  Fact Sheet for Healthcare Providers: seriousbroker.it  This test is not yet approved or cleared by the [place]  FDA and has been authorized for detection and/or diagnosis of SARS-CoV-2 by FDA under an Emergency Use Authorization (EUA). This EUA will remain in effect (meaning this test can be used) for the duration of the COVID-19 declaration under Section 564(b)(1) of the Act, 21 U.S.C. section 360bbb-3(b)(1), unless the authorization is terminated or revoked.  Performed at [facility], [address] [place], [place] [address]   Respiratory (~20 pathogens) panel by PCR     Status: Abnormal   Collection Time: 09/15/24 10:25 PM   Specimen: Nasopharyngeal Swab; Respiratory  Result Value Ref Range Status   Adenovirus NOT DETECTED NOT DETECTED Final   Coronavirus 229E NOT DETECTED NOT DETECTED Final    Comment: (NOTE) The Coronavirus on the Respiratory Panel, DOES NOT test for the novel  Coronavirus (2019 nCoV)    Coronavirus HKU1 NOT DETECTED NOT  DETECTED Final   Coronavirus NL63 NOT DETECTED NOT DETECTED Final   Coronavirus OC43 NOT DETECTED NOT DETECTED Final   Metapneumovirus NOT DETECTED NOT DETECTED Final   Rhinovirus / Enterovirus DETECTED (A) NOT DETECTED Final   Influenza A NOT DETECTED NOT DETECTED Final   Influenza B NOT DETECTED NOT DETECTED Final   Parainfluenza Virus 1 NOT DETECTED NOT DETECTED Final   Parainfluenza Virus 2 NOT DETECTED NOT DETECTED Final   Parainfluenza Virus 3 NOT DETECTED NOT DETECTED Final   Parainfluenza Virus 4 NOT DETECTED NOT DETECTED Final   Respiratory Syncytial Virus NOT DETECTED NOT DETECTED Final   Bordetella pertussis NOT DETECTED NOT DETECTED Final   Bordetella Parapertussis NOT DETECTED NOT DETECTED Final   Chlamydophila pneumoniae NOT DETECTED NOT DETECTED Final   Mycoplasma pneumoniae NOT DETECTED NOT DETECTED Final    Comment: Performed at [facility], [address] [place], [place] [address]  Resp panel by RT-PCR (RSV, Flu A&B, Covid) Anterior Nasal Swab     Status: None   Collection Time: 09/23/24 11:17 AM   Specimen: Anterior Nasal Swab  Result Value Ref Range Status   SARS Coronavirus 2 by RT PCR NEGATIVE NEGATIVE Final    Comment: (NOTE) SARS-CoV-2 target nucleic acids are NOT DETECTED.  The SARS-CoV-2 RNA is generally detectable in upper respiratory specimens during the acute phase of infection. The lowest concentration of SARS-CoV-2 viral copies this assay can detect is 138 copies/mL. A negative result does not preclude SARS-Cov-2 infection and should not be used as the sole basis for treatment or other patient management decisions. A negative result may occur with  improper specimen collection/handling, submission of specimen other than nasopharyngeal swab, presence of viral mutation(s) within the areas targeted by this assay, and inadequate number of viral copies(<138 copies/mL). A negative result must be combined with clinical observations, patient history,  and epidemiological information. The expected result is Negative.  Fact Sheet for Patients:  bloggercourse.com  Fact Sheet for Healthcare Providers:  seriousbroker.it  This test is no t yet approved or cleared by the [place]  FDA and  has been authorized for detection and/or diagnosis of SARS-CoV-2 by FDA under an Emergency Use Authorization (EUA). This EUA will remain  in effect (meaning this test can be used) for the duration of  the COVID-19 declaration under Section 564(b)(1) of the Act, 21 U.S.C.section 360bbb-3(b)(1), unless the authorization is terminated  or revoked sooner.       Influenza A by PCR NEGATIVE NEGATIVE Final   Influenza B by PCR NEGATIVE NEGATIVE Final    Comment: (NOTE) The Xpert Xpress SARS-CoV-2/FLU/RSV plus assay is intended as an aid in the diagnosis of influenza from Nasopharyngeal swab specimens and should not be used as a sole basis for treatment. Nasal washings and aspirates are unacceptable for Xpert Xpress SARS-CoV-2/FLU/RSV testing.  Fact Sheet for Patients: bloggercourse.com  Fact Sheet for Healthcare Providers: seriousbroker.it  This test is not yet approved or cleared by the [place]  FDA and has been authorized for detection and/or diagnosis of SARS-CoV-2 by FDA under an Emergency Use Authorization (EUA). This EUA will remain in effect (meaning this test can be used) for the duration of the COVID-19 declaration under Section 564(b)(1) of the Act, 21 U.S.C. section 360bbb-3(b)(1), unless the authorization is terminated or revoked.     Resp Syncytial Virus by PCR NEGATIVE NEGATIVE Final    Comment: (NOTE) Fact Sheet for Patients: bloggercourse.com  Fact Sheet for Healthcare Providers: seriousbroker.it  This test is not yet approved or cleared by the [place]  FDA and has  been authorized for detection and/or diagnosis of SARS-CoV-2 by FDA under an Emergency Use Authorization (EUA). This EUA will remain in effect (meaning this test can be used) for the duration of the COVID-19 declaration under Section 564(b)(1) of the Act, 21 U.S.C. section 360bbb-3(b)(1), unless the authorization is terminated or revoked.  Performed at [facility], [address] [place] [address], [place], [place] [address]          Radiology Studies: US  Venous Img Lower  Left (DVT Study) Result Date: 09/23/2024 CLINICAL DATA:  Swelling. EXAM: LEFT LOWER EXTREMITY VENOUS DOPPLER ULTRASOUND TECHNIQUE: Gray-scale sonography with compression, as well as color and duplex ultrasound, were performed to evaluate the deep venous system(s) from the level of the common femoral vein through the popliteal and proximal calf veins. COMPARISON:  None Available. FINDINGS: VENOUS Normal compressibility of the common femoral, superficial femoral, and popliteal veins, as well as the visualized calf veins. Visualized portions of profunda femoral vein and great saphenous vein unremarkable. No filling defects to suggest DVT on grayscale or color Doppler imaging. Doppler waveforms show normal direction of venous flow, normal respiratory plasticity and response to augmentation. Limited views of the contralateral common femoral vein are unremarkable. OTHER Subcutaneous edema. Limitations: Soft tissue edema limits assessment of the calf veins. IMPRESSION: No evidence of left lower extremity DVT. Electronically Signed   By: Andrea Gasman M.D.   On: 09/23/2024 12:29   CT CHEST ABDOMEN PELVIS WO CONTRAST Result Date: 09/23/2024 EXAM: CT CHEST, ABDOMEN AND PELVIS WITHOUT CONTRAST 09/23/2024 11:29:26 AM TECHNIQUE: CT of the chest, abdomen and pelvis was performed without the administration of intravenous contrast. Multiplanar reformatted images are provided for review. Automated exposure control, iterative  reconstruction, and/or weight based adjustment of the mA/kV was utilized to reduce the radiation dose to as low as reasonably achievable. COMPARISON: None available. CLINICAL HISTORY: Cough and pain. FINDINGS: CHEST: MEDIASTINUM AND LYMPH NODES: Heart and pericardium are unremarkable. The central airways are clear. No mediastinal, hilar or axillary lymphadenopathy. LUNGS AND PLEURA: Mild intralobular septal thickening scattered throughout the lungs. Small to moderate volume right pleural effusion with compressive atelectasis. Trace left pleural effusion. Pulmonary cystectomy. No focal consolidation or pulmonary edema. No pneumothorax. ABDOMEN AND PELVIS: LIVER: The liver is  unremarkable. GALLBLADDER AND BILE DUCTS: Gallbladder is unremarkable. No biliary ductal dilatation. SPLEEN: No acute abnormality. PANCREAS: No acute abnormality. ADRENAL GLANDS: 0.8 cm left adrenal nodule, likely an adrenal adenoma. KIDNEYS, URETERS AND BLADDER: Mild renal cortical atrophy of both kidneys. Multiple small nonobstructive calyceal calculi. No hydronephrosis. No perinephric or periureteral stranding. The urinary bladder is distended, without focal abnormality. GI AND BOWEL: Stomach demonstrates no acute abnormality. Scattered colonic diverticulosis. Normal decompressed gas filled appendix, best visualized on the coronal images. There is no bowel obstruction. REPRODUCTIVE ORGANS: Moderate prostatomegaly. PERITONEUM AND RETROPERITONEUM: Mesenteric edema with small volume ascites. No free air. VASCULATURE: Extensive, multivessel coronary atherosclerosis. Diffuse calcified atherosclerosis throughout the aorta and great vessels. Diffuse aortoiliac atherosclerosis with calcified atherosclerosis throughout the mesenteric vessels. ABDOMINAL AND PELVIS LYMPH NODES: No lymphadenopathy. BONES AND SOFT TISSUES: Diffuse osteopenia. Rotator cuff anchors are present in the right humeral head. Right hip arthroplasty is anatomically aligned without  dislocation. Multilevel degenerative disc disease of the spine. Mild diffuse anasarca. IMPRESSION: 1. Mild cardiomegaly with findings of minimal interstitial edema. Small to moderate right and trace left pleural effusions. 2. Mild mesenteric edema, small volume ascites, and anasarca, also likely related to the patient's volume status. 3. Small nonobstructive bilateral renal calculi. No hydronephrosis in the kidney. 4. Scattered colonic diverticulosis. No changes of acute diverticulitis. Electronically signed by: Rogelia Myers MD 09/23/2024 12:03 PM EST RP Workstation: [id number]        Scheduled Meds:  allopurinol   50 mg Oral QODAY   feeding supplement  237 mL Oral BID BM   furosemide   40 mg Intravenous BID   insulin  aspart  0-9 Units Subcutaneous TID WC   insulin  glargine  5 Units Subcutaneous Daily   pantoprazole   40 mg Oral BID   potassium chloride   40 mEq Oral Q4H   rosuvastatin   20 mg Oral Daily   sodium chloride  flush  3 mL Intravenous Q12H   sucralfate   1 g Oral BID   terazosin   5 mg Oral BID   Continuous Infusions:        Derryl Duval, MD [facility] 09/24/2024, 2:52 PM   "

## 2024-09-24 NOTE — Evaluation (Signed)
 Occupational Therapy Evaluation Patient Details Name: Keith Espinoza MRN: [id number] DOB: 1940/12/02 Today's Date: 09/24/2024   History of Present Illness   Pt is an 83 y.o. M presenting to [facility] on 09/23/24 with leg swelling and abdominal pain. Pt admitted for acute on chronic HF. PMH is significant for CAD, HF, DM, R BKA, PAD, and vertigo.     Clinical Impressions Pt presents with decline in function and safety with ADLs and ADL mobility with impaired strength, balance and endurance. PTA pt reports that he was Ind-Mod I with ADLs from w/c or bed level at sink, SPTs to shower chair no assists, does own meal prep; pt's daughter takes him to appointments and does his grocery shopping. Pt reports that he SPT to W/C with R prosthesis donned and reports wearing prosthesis all day. Pt currently Mod I to sit EOB, set up with UB ADLs CGA/Sup with LB ADLs seated, pt able to lateral scoot transfer to chair, declining any further OOB activity to bathroom and sink. OT educated pt on benefits/importance of mobility ad he verbalizes that he is weak and needs to get stronger, however pt continued to declined any further activity today. OT will continue to follow acutely to maximize level of function and safety     If plan is discharge home, recommend the following:   A little help with walking and/or transfers;A little help with bathing/dressing/bathroom;Assistance with cooking/housework;Assist for transportation;Help with stairs or ramp for entrance     Functional Status Assessment   Patient has had a recent decline in their functional status and demonstrates the ability to make significant improvements in function in a reasonable and predictable amount of time.     Equipment Recommendations   None recommended by OT     Recommendations for Other Services         Precautions/Restrictions   Precautions Precautions: Fall Recall of Precautions/Restrictions:  Intact Restrictions Weight Bearing Restrictions Per Provider Order: No     Mobility Bed Mobility Overal bed mobility: Modified Independent                  Transfers Overall transfer level: Needs assistance Equipment used: 1 person hand held assist Transfers: Bed to chair/wheelchair/BSC            Lateral/Scoot Transfers: Supervision General transfer comment: pt declined BSC transfers, agreeable to transfer to recliner and back to bed      Balance Overall balance assessment: Needs assistance Sitting-balance support: No upper extremity supported, Feet supported Sitting balance-Leahy Scale: Fair                                     ADL either performed or assessed with clinical judgement   ADL Overall ADL's : Needs assistance/impaired Eating/Feeding: Independent;Sitting   Grooming: Wash/dry hands;Wash/dry face;Set up;Sitting   Upper Body Bathing: Set up;Sitting   Lower Body Bathing: Contact guard assist;Sitting/lateral leans   Upper Body Dressing : Set up;Sitting   Lower Body Dressing: Contact guard assist;Sitting/lateral leans   Toilet Transfer: Supervision/safety Toilet Transfer Details (indicate cue type and reason): lateral scoot to recliner and back, declined BSC transfers Toileting- Clothing Manipulation and Hygiene: Contact guard assist;Supervision/safety;Sitting/lateral lean       Functional mobility during ADLs: Supervision/safety General ADL Comments: pt declined walking to bathroom and sink activities     Vision Baseline Vision/History: 1 Wears glasses Ability to See in Adequate Light: 0 Adequate Patient Visual  Report: No change from baseline       Perception         Praxis         Pertinent Vitals/Pain Pain Assessment Pain Assessment: No/denies pain     Extremity/Trunk Assessment Upper Extremity Assessment Upper Extremity Assessment: Generalized weakness;Right hand dominant   Lower Extremity Assessment Lower  Extremity Assessment: Defer to PT evaluation   Cervical / Trunk Assessment Cervical / Trunk Assessment: Kyphotic   Communication Communication Communication: Impaired Factors Affecting Communication: Hearing impaired   Cognition Arousal: Alert Behavior During Therapy: WFL for tasks assessed/performed                                 Following commands: Intact       Cueing  General Comments   Cueing Techniques: Verbal cues  SpO2 100% on RA with HR 104 bpm following transfer completion   Exercises     Shoulder Instructions      Home Living Family/patient expects to be discharged to:: Private residence Living Arrangements: Alone Available Help at Discharge: Family;Available PRN/intermittently Type of Home: House Home Access: Level entry     Home Layout: Able to live on main level with bedroom/bathroom     Bathroom Shower/Tub: Walk-in shower   Bathroom Toilet: Handicapped height Bathroom Accessibility: Yes   Home Equipment: Agricultural Consultant (2 wheels);BSC/3in1;Shower seat;Cane - single point;Wheelchair - manual;Wheelchair - power;Hand held shower head;Grab bars - tub/shower;Grab bars - toilet;Adaptive equipment;Lift chair;Other (comment) Adaptive Equipment: Reacher;Sock aid;Long-handled shoe horn;Long-handled sponge Additional Comments: pt reports that his grand daughter and her family lived upstairs in his home, but they do not act like they live together. they occasionally bring him a meal downstairs but he is self sufficient. home setup taken from prior admission      Prior Functioning/Environment Prior Level of Function : Independent/Modified Independent             Mobility Comments: pt performs stand pivot transfer to W/C with R prosthesis donned and reports wearing prosthesis all day ADLs Comments: Mod I with ADLs from w/c or bed level, completes grooming seated in w/c, stand pivots to shower chair no assists. does own meal prep. daughter takes  him to appointments and does his grocery shopping most of the time    OT Problem List: Decreased strength;Decreased activity tolerance;Impaired balance (sitting and/or standing);Decreased knowledge of use of DME or AE;Decreased knowledge of precautions;Cardiopulmonary status limiting activity   OT Treatment/Interventions: Self-care/ADL training;Therapeutic exercise;DME and/or AE instruction;Therapeutic activities;Patient/family education;Balance training      OT Goals(Current goals can be found in the care plan section)   Acute Rehab OT Goals Patient Stated Goal: go home OT Goal Formulation: With patient Time For Goal Achievement: 10/08/24 Potential to Achieve Goals: Good ADL Goals Pt Will Perform Lower Body Bathing: with supervision;with set-up;sitting/lateral leans Pt Will Perform Lower Body Dressing: with supervision;with set-up;sitting/lateral leans Pt Will Transfer to Toilet: with modified independence;stand pivot transfer Pt Will Perform Toileting - Clothing Manipulation and hygiene: with supervision;with modified independence;sitting/lateral leans   OT Frequency:  Min 2X/week    Co-evaluation              AM-PAC OT 6 Clicks Daily Activity     Outcome Measure Help from another person eating meals?: None Help from another person taking care of personal grooming?: A Little Help from another person toileting, which includes using toliet, bedpan, or urinal?: A Little Help from another person  bathing (including washing, rinsing, drying)?: A Little Help from another person to put on and taking off regular upper body clothing?: A Little Help from another person to put on and taking off regular lower body clothing?: A Little 6 Click Score: 19   End of Session Nurse Communication: Mobility status  Activity Tolerance: Other (comment) (self limiting) Patient left: in bed;with call bell/phone within reach;with bed alarm set;with nursing/sitter in room  OT Visit Diagnosis:  Unsteadiness on feet (R26.81);Muscle weakness (generalized) (M62.81);Other (comment)                Time: 8997-8974 OT Time Calculation (min): 23 min Charges:  OT General Charges $OT Visit: 1 Visit OT Evaluation $OT Eval Moderate Complexity: 1 Mod OT Treatments $Self Care/Home Management : 8-22 mins    Jacques Aquas [facility] 09/24/2024, 11:42 AM

## 2024-09-24 NOTE — Evaluation (Signed)
 Physical Therapy Evaluation Patient Details Name: Keith Espinoza MRN: [id number] DOB: [birth year] Today's Date: 09/24/2024  History of Present Illness  Pt is an 83 y.o. M presenting to [facility] on 09/23/24 with leg swelling and abdominal pain. Pt admitted for acute on chronic HF. PMH is significant for CAD, HF, DM, R BKA, PAD, and vertigo.  Clinical Impression  Prior to admittance pt was mod I with mobility, performing stand pivot transfer to [facility] and utilizing WC for mobility; pt mod I with ADLs. Pt presents to evaluation with deficits in mobility, strength, power, activity tolerance, and balance. Pt was able to complete lateral scoot transfer to/from recliner without physical assistance, R prosthesis donned, and closeby supervision for safety. Pt declines further OOB mobility but reports feeling weaker and wants to improve his LE strength. Therapist provided education on the importance of mobility for improving strength and balance with pt verbalizing understanding but continuing to decline further WB exercises at this time. Pt would benefit from further transfer training and LE strengthening. PT will continue to treat pt while he is admitted. Recommending HHPT at discharge to address remaining mobility deficits and optimize return to PLOF.       If plan is discharge home, recommend the following: Assist for transportation   Can travel by private vehicle        Equipment Recommendations None recommended by PT  Recommendations for Other Services       Functional Status Assessment Patient has had a recent decline in their functional status and demonstrates the ability to make significant improvements in function in a reasonable and predictable amount of time.     Precautions / Restrictions Precautions Precautions: Fall Recall of Precautions/Restrictions: Intact Restrictions Weight Bearing Restrictions Per Provider Order: No      Mobility  Bed Mobility Overal bed mobility:  Modified Independent             General bed mobility comments: pt receieved and returned sitting EOB    Transfers Overall transfer level: Needs assistance   Transfers: Bed to chair/wheelchair/BSC            Lateral/Scoot Transfers: Supervision General transfer comment: able to scoot to recliner on the L with R prosthesis donned and closeby supervision for safety. increased time to complete. pt declined further mobility.    Ambulation/Gait                  Stairs            Wheelchair Mobility     Tilt Bed    Modified Rankin (Stroke Patients Only)       Balance Overall balance assessment: Needs assistance Sitting-balance support: No upper extremity supported, Feet supported Sitting balance-Leahy Scale: Fair Sitting balance - Comments: seated EOB                                     Pertinent Vitals/Pain Pain Assessment Pain Assessment: No/denies pain    Home Living Family/patient expects to be discharged to:: Private residence Living Arrangements: Alone Available Help at Discharge: Family;Available PRN/intermittently Type of Home: House Home Access: Level entry       Home Layout: Able to live on main level with bedroom/bathroom Home Equipment: Rolling Walker (2 wheels);BSC/3in1;Shower seat;Cane - single point;Wheelchair - manual;Wheelchair - power;Hand held shower head;Grab bars - tub/shower;Grab bars - toilet;Adaptive equipment;Lift chair;Other (comment) Additional Comments: pt reports that his grand daughter and her family  lived upstairs in his home, but they do not act like they live together. they occasionally bring him a meal downstairs but he is self sufficient. home setup taken from prior admission    Prior Function Prior Level of Function : Independent/Modified Independent             Mobility Comments: pt performs stand pivot transfer to [facility] with R prosthesis donned and reports wearing prosthesis all day ADLs  Comments: Mod I with ADLs from w/c or bed level, completes grooming seated in w/c, stand pivots to shower chair no assists. does own meal prep. daughter takes him to appointments and does his grocery shopping most of the time     Extremity/Trunk Assessment   Upper Extremity Assessment Upper Extremity Assessment: Generalized weakness    Lower Extremity Assessment Lower Extremity Assessment: Generalized weakness    Cervical / Trunk Assessment Cervical / Trunk Assessment: Kyphotic  Communication   Communication Communication: Impaired Factors Affecting Communication: Hearing impaired    Cognition Arousal: Alert Behavior During Therapy: Agitated   PT - Cognitive impairments: No apparent impairments                       PT - Cognition Comments: Pt reporting frustration with situation. Following commands: Intact       Cueing Cueing Techniques: Verbal cues     General Comments General comments (skin integrity, edema, etc.): SpO2 100% on RA with HR 104 bpm following transfer completion    Exercises General Exercises - Lower Extremity Long Arc Quad: Strengthening, Left, 10 reps, Seated (resistance applied to distal end of tibia)   Assessment/Plan    PT Assessment Patient needs continued PT services  PT Problem List Decreased strength;Decreased range of motion;Decreased activity tolerance;Decreased mobility;Decreased balance;Decreased knowledge of use of DME;Decreased safety awareness       PT Treatment Interventions DME instruction;Functional mobility training;Therapeutic exercise;Therapeutic activities;Balance training;Patient/family education;Wheelchair mobility training;Manual techniques    PT Goals (Current goals can be found in the Care Plan section)  Acute Rehab PT Goals Patient Stated Goal: to get stronger and go home PT Goal Formulation: With patient Time For Goal Achievement: 10/08/24 Potential to Achieve Goals: Good    Frequency Min 1X/week      Co-evaluation               AM-PAC PT 6 Clicks Mobility  Outcome Measure Help needed turning from your back to your side while in a flat bed without using bedrails?: None Help needed moving from lying on your back to sitting on the side of a flat bed without using bedrails?: None Help needed moving to and from a bed to a chair (including a wheelchair)?: A Little Help needed standing up from a chair using your arms (e.g., wheelchair or bedside chair)?: A Little Help needed to walk in hospital room?: A Little Help needed climbing 3-5 steps with a railing? : Total 6 Click Score: 18    End of Session   Activity Tolerance: Patient tolerated treatment well Patient left: in bed;with call bell/phone within reach;with bed alarm set Nurse Communication: Mobility status PT Visit Diagnosis: Muscle weakness (generalized) (M62.81);Other abnormalities of gait and mobility (R26.89)    Time: 9150-9088 PT Time Calculation (min) (ACUTE ONLY): 22 min   Charges:   PT Evaluation $PT Eval Low Complexity: 1 Low   PT General Charges $$ ACUTE PT VISIT: 1 Visit         Leontine Hilt DPT Acute Rehab Services [phone] Prefer  contact via chat   Leontine NOVAK Mychal Durio 09/24/2024, 9:24 AM

## 2024-09-24 NOTE — Progress Notes (Signed)
" ° °  Heart Failure Stewardship Pharmacist Progress Note   PCP: Charlott Dorn LABOR, MD PCP-Cardiologist: Gordy Bergamo, MD    HPI:  83 yo M with PMH of HTN, HLD, CHF, CAD, T2DM, PAD s/p R BKA, and CKD III.  Admitted 12/13-12/17 with right lower lobe pneumonia.   Presented to the ED on 12/21 with increased LE edema and abdominal distention. States that during the last hospitalization he was not given his lasix  which contributed to his symptoms. Reports decreased appetite. Has been taking lasix  20 mg daily at home (40 mg the day of arrival to the hospital) and his symptoms did not improve. proBNP elevated. CT chest with mild cardiomegaly and minimal interstitial edema. Last ECHO 05/17/24 with LVEF 40-45%, global hypokinesis, moderate concentric LVH, G1DD, RV normal, moderate AS.  Reports improvement in breathing and LE edema. Potassium low today, 2.9.  Current HF Medications: Diuretic: furosemide  40 mg IV BID  Prior to admission HF Medications: Diuretic: furosemide  20 mg daily SGLT2i: Jardiance  25 mg daily  Pertinent Lab Values: Serum creatinine 2.50 (baseline 1.4-1.6), BUN 19, Potassium 2.9, Sodium 139, proBNP 20065, Magnesium  1.5, A1c 7.9  Vital Signs: Weight: 146 lbs (admission weight: estimated 160 lbs) Blood pressure: 90-100/70s  Heart rate: 80-100s  I/O: net -0.8L yesterday; net -2.6L since admission  Medication Assistance / Insurance Benefits Check: Does the patient have prescription insurance?  Yes Type of insurance plan: Medicare + supplement  Outpatient Pharmacy:  Prior to admission [facility] Is the patient willing to use [facility] TOC pharmacy at discharge? Yes Is the patient willing to transition their outpatient pharmacy to utilize a [facility] outpatient pharmacy?   Pending    Assessment: 1. Acute on chronic systolic CHF (LVEF 40-45%), due to ICM. NYHA class III symptoms. - Continue furosemide  40 mg IV BID. Strict I/Os and daily weights. Keep K>4 and Mg>2.  Needs aggressive K supplementation. - Further GDMT pending improvement in renal function - Consider adding BB once euvolemic   Plan: 1) Medication changes recommended at this time: - Continue diuresis and K supplementation   2) Patient assistance: - None pending  3)  Education  - To be completed prior to discharge  Duwaine Plant, PharmD, BCPS Heart Failure Stewardship Pharmacist Phone [phone]   "

## 2024-09-24 NOTE — Progress Notes (Signed)
 Heart Failure Nurse Navigator Progress Note  PCP: Charlott Dorn LABOR, MD PCP-Cardiologist: [place] Admission Diagnosis: Anasarca, Hypervolemia, Acute heart failure.  Admitted from: Home  Presentation:   Keith Espinoza presented with with increased leg and abdomen edema. Family states he was admitted to [facility] earlier this week for pneumonia and was not given his medications correctly. BP 111/77, HR 118, Pro BNP 20,065, Creat 2.55, Troponin 132, EKG shows sinus rhythm with PVCs. CT scan of the chest abdomen and pelvis consistent with volume overload. Reports decreased appetite, patient observed from staff coughing after he eats, concerns for aspiration. Patient declined a MBS with speech Team.   Patient was educated on the the sign and symptoms of heart failure, daily weights, when to call his doctor or go to the ED. Diet/ fluid restrictions, especially salt and salty foods. Continued education on taking all medications as prescribed and attending all medial appointments. Patient verbalized his understanding of all education. A HF TOC appointment was scheduled for 10/12/2024 @ 10:30 am, because Fridays are his daughters day off and she would be the one driving him.     ECHO/ LVEF: 40-45%   Clinical Course:  Past Medical History:  Diagnosis Date   Allergic rhinitis    Anemia    Aortic stenosis    Arthritis    Basal cell carcinoma 11/01/2019    bcc left chest treatment [place] cx3 [id number]    CHF (congestive heart failure) (HCC)    Chronic leg pain    right   Chronic lower back pain    Coronary artery disease    a. Stenting to RCA 2004; staged DES to LAD and Cx 2004. DES to [facility] 2012. b. DES to mCx, PTCA to dCx 11/2011. c. Lateral wall MI 2013 s/p PTCA to distal Cx & DES to mid OM2 11/2011. d. Low risk nuc 04/2014, EF wnl.   COVID-19    Diabetes mellitus    Insulin  dependent   Diabetic neuropathy (HCC)    MILD   Diverticulosis    Dysrhythmia    Bertrum syndrome    Gout    right wrist;  right foot; right elbow; have had it since 1970's   H/O hiatal hernia    Heart murmur    History of echocardiogram    aortic sclerosis per echo 12/09 EF 65%, otherwise normal   History of hemorrhoids    BLEEDING   History of kidney stones    h/o   Hypertension    Diagnosed 1995    Myocardial infarction [facility])    Pancreatic pseudocyst    a. s/p remote drainage 2006.   Thrombocytopenia    Seen on oldest labs in system from 2004   Vertigo    Vitamin B 12 deficiency    orally replaced     Social History   Socioeconomic History   Marital status: Widowed    Spouse name: Not on file   Number of children: Not on file   Years of education: Not on file   Highest education level: Not on file  Occupational History   Occupation: Retired  Tobacco Use   Smoking status: Former    Types: Cigarettes   Smokeless tobacco: Current    Types: Chew   Tobacco comments:    quit 60 years ago  Vaping Use   Vaping status: Never Used  Substance and Sexual Activity   Alcohol use: No   Drug use: No   Sexual activity: Not Currently  Other Topics Concern  Not on file  Social History Narrative   Not on file   Social Drivers of Health   Tobacco Use: High Risk (09/23/2024)   Patient History    Smoking Tobacco Use: Former    Smokeless Tobacco Use: Current    Passive Exposure: Not on file  Financial Resource Strain: Low Risk (05/16/2023)   Received from [facility]   Overall Financial Resource Strain (CARDIA)    Difficulty of Paying Living Expenses: Not very hard  Food Insecurity: No Food Insecurity (09/23/2024)   Epic    Worried About Radiation Protection Practitioner of Food in the Last Year: Never true    Ran Out of Food in the Last Year: Never true  Transportation Needs: No Transportation Needs (09/23/2024)   Epic    Lack of Transportation (Medical): No    Lack of Transportation (Non-Medical): No  Physical Activity: Not on file  Stress: No Stress Concern Present (06/12/2023)   Received from [facility] Occupational [facility] - Occupational Stress Questionnaire    Feeling of Stress : Not at all  Social Connections: Socially Isolated (09/23/2024)   Social Connection and Isolation Panel    Frequency of Communication with Friends and Family: More than three times a week    Frequency of Social Gatherings with Friends and Family: More than three times a week    Attends Religious Services: Never    Database Administrator or Organizations: No    Attends Banker Meetings: Never    Marital Status: Widowed  Depression (PHQ2-9): Low Risk (04/21/2022)   Depression (PHQ2-9)    PHQ-2 Score: 0  Alcohol Screen: Not on file  Housing: Low Risk (09/23/2024)   Epic    Unable to Pay for Housing in the Last Year: No    Number of Times Moved in the Last Year: 0    Homeless in the Last Year: No  Utilities: Not At Risk (09/23/2024)   Epic    Threatened with loss of utilities: No  Health Literacy: Not on file   Education Assessment and Provision:  Detailed education and instructions provided on heart failure disease management including the following:  Signs and symptoms of Heart Failure When to call the physician Importance of daily weights Low sodium diet Fluid restriction Medication management Anticipated future follow-up appointments  Patient education given on each of the above topics.  Patient acknowledges understanding via teach back method and acceptance of all instructions.  Education Materials:  Living Better With Heart Failure Booklet, HF zone tool, & Daily Weight Tracker Tool.  Patient has scale at home: No, will buy one.  Patient has pill box at home: Yes    High Risk Criteria for Readmission and/or Poor Patient Outcomes: Heart failure hospital admissions (last 6 months): 1  No Show rate: 1 %  Difficult social situation: No Demonstrates medication adherence: yes, his daughter makes his weekly pill box for him.  Primary Language: English   Literacy level: Reading, writing, and comprehension   Barriers of Care:   Medication understanding Diet/ fluid restrictions,  reported to pouring salt on his hand and licking it off he loves salt.  Daily weights, uses a wheel chair to get around at times , has a right BKA  Considerations/Referrals:   Referral made to Heart Failure Pharmacist Stewardship: Yes Referral made to Heart Failure CSW/NCM TOC: NA Referral made to Heart & Vascular TOC clinic: Yes, 10/12/2024 @ 10:30 am. He can only do Friday appointments. Its when  is daughter is off of work to take him.   Items for Follow-up on DC/TOC: Continued HF education Diet/ fluid restrictions ( heavy salt user)  Daily weights   Stephane Haddock, BSN, RN Heart Failure Print Production Planner Chat Only

## 2024-09-24 NOTE — Progress Notes (Signed)
 Notified by CCMD pt had 5 bts non sustained VT. Pt resting quietly.

## 2024-09-25 ENCOUNTER — Other Ambulatory Visit (HOSPITAL_COMMUNITY): Payer: Self-pay

## 2024-09-25 DIAGNOSIS — I5020 Unspecified systolic (congestive) heart failure: Secondary | ICD-10-CM | POA: Diagnosis not present

## 2024-09-25 LAB — BASIC METABOLIC PANEL WITH GFR
Anion gap: 13 (ref 5–15)
BUN: 21 mg/dL (ref 8–23)
CO2: 24 mmol/L (ref 22–32)
Calcium: 9.1 mg/dL (ref 8.9–10.3)
Chloride: 97 mmol/L — ABNORMAL LOW (ref 98–111)
Creatinine, Ser: 2.37 mg/dL — ABNORMAL HIGH (ref 0.61–1.24)
GFR, Estimated: 26 mL/min — ABNORMAL LOW
Glucose, Bld: 263 mg/dL — ABNORMAL HIGH (ref 70–99)
Potassium: 4.4 mmol/L (ref 3.5–5.1)
Sodium: 134 mmol/L — ABNORMAL LOW (ref 135–145)

## 2024-09-25 LAB — GLUCOSE, CAPILLARY
Glucose-Capillary: 271 mg/dL — ABNORMAL HIGH (ref 70–99)
Glucose-Capillary: 267 mg/dL — ABNORMAL HIGH (ref 70–99)

## 2024-09-25 MED ORDER — ALLOPURINOL 100 MG PO TABS
50.0000 mg | ORAL_TABLET | ORAL | 0 refills | Status: AC
  Filled 2024-09-25: qty 7.5, 30d supply, fill #0

## 2024-09-25 MED ORDER — FUROSEMIDE 20 MG PO TABS
20.0000 mg | ORAL_TABLET | Freq: Every day | ORAL | 2 refills | Status: DC
  Filled 2024-09-25: qty 30, 30d supply, fill #0

## 2024-09-25 NOTE — Discharge Summary (Addendum)
 Physician Discharge Summary  Keith Espinoza:[id number] DOB: [birth year] DOA: 09/23/2024  PCP: Keith Espinoza LABOR, MD  Admit date: 09/23/2024 Discharge date: 09/25/2024  Admitted From: Home Disposition: Home with home health  Recommendations for Outpatient Follow-up:  Follow up with PCP in 1 week with repeat BMP Follow-up with your cardiology Follow up in ED if symptoms worsen or new appear   Home Health:Yes  Discharge Condition: Stable CODE STATUS: Full Diet recommendation: Heart healthy, fluid restriction 2000 mL/day  Brief/Interim Summary:  Keith Espinoza is a 83 y.o. male with medical history significant of hypertension, hyperlipidemia, heart failure with reduced ejection fraction, coronary artery disease, diabetes mellitus type 2, peripheral artery disease status post right BKA, and CKD stage III who presents with leg swelling and abdominal pain.     Patient was just recently hospitalized from 2/13 to 12/17 for pneumonia/rhinovirus.  During that hospitalization his Lasix  was kept on hold due to low blood pressures.  Patient restarted developing lower extremity edema and also feeling bloated. Came to the ER for further evaluation.   In the emergency department patient was noted to be afebrile with pulse of 80-1 18, respiration 17-26, other vital signs relatively maintained.  Labs noted hemoglobin 10.3, platelets 80, BUN 19, creatinine 2.55, glucose 199, protein BNP 20,065, and high-sensitivity troponin 132.  CT scanning of the chest abdomen pelvis was obtained which noted mild cardiomegaly with findings of interstitial edema, small to moderate right and trace left pleural effusions, mild mesenteric edema with small volume ascites and anasarca, and small nonobstructive bilateral renal calculi with no signs of hydronephrosis.  Doppler ultrasound of the lower extremity did not reveal any signs of a left lower leg DVT.   Patient admitted for worsening lower  extremity edema/leg edema likely CHF exacerbation.  Discharge Diagnoses:  Principal Problem:   HFrEF (heart failure with reduced ejection fraction) (HCC) Active Problems:   Elevated troponin   Acute kidney injury superimposed on chronic kidney disease   Abdominal pain   Type 2 diabetes mellitus with hyperglycemia, with long-term current use of insulin  (HCC)   Dysphagia   Thrombocytopenia   Hyperlipidemia   Coronary artery disease   Paroxysmal A-fib (HCC)   Weakness   Elevated liver enzymes   Enlarged prostate   Esophageal reflux   Tobacco abuse    Heart failure with reduced ejection fraction  CT scanning of the chest abdomen pelvis was obtained which noted mild cardiomegaly with findings of interstitial edema, small to moderate right and trace left pleural effusions, mild mesenteric edema with small volume ascites and anasarca.   Last echocardiogram noted EF to be 40 to 45% with grade 1 diastolic dysfunction when last checked 05/16/2024.  Patient diuresed well with IV Lasix  with improvement in lower extremity edema.  He did not have any shortness of breath to begin with. On discharge, he is advised to continue with 20 mg daily of Lasix  and follow-up with PCP in 1 week.  Discussed  low-salt diet,  fluid restriction. His creatinine has been above 2 with likely is his new baseline.  Need to closely follow-up with PCP or nephrology.  hypokalemia, hypomagnesemia Potassium is 2.9, magnesium  1.5. Repleted.   Elevated troponin Acute on chronic.  High-sensitivity troponin elevated at 132 which is higher than prior.  Patient denies any complaints of chest pain at this time.  EKG did not appear to show any significant ischemic changes.   Acute kidney injury superimposed on chronic kidney disease stage III Patient presents with  creatinine elevated up to 2.55 with BUN 19.  Baseline creatinine previously had been around 1.4-1.6.  CT imaging did not reveal any signs of hydronephrosis and small  bilateral nonobstructive nephrolithiasis. - Avoid nephrotoxic agents - Monitor kidney function with diuresis   Abdominal pain Symptoms improved.- Continue PPI, Carafate , and Bentyl  as needed   Diabetes mellitus type 2 with hyperglycemia with long-term use of insulin  On admission glucose noted to be 199.  Last hemoglobin A1c noted to be 7.9 when checked on 06/08/2024. Continue home insulin .  Thrombocytopenia Chronic.  Platelet count noted to be 80 which appears similar to priors. - Continue to monitor   Coronary artery disease Peripheral vascular disease Hyperlipidemia Aspirin  was held during last hospitalization due to thrombocytopenia - Continue Plavix  and Crestor     Paroxysmal atrial fibrillation Patient had previously been on dual antiplatelet therapy, but currently just continued on Plavix  due to thrombocytopenia not on any rate controlling medications at this time.   Weakness Patient reports significant week with issues with his balance unable to ambulate like prior. - PT/OT to eval and treat   Elevated liver enzymes Possibly chronic.  Thought possibly secondary to passive congestion in the setting of patient being acutely fluid overloaded.   BPH - Continue terazosin    GERD - Continue Protonix  and Carafate    Tobacco abuse - Continue to counsel need of cessation of tobacco use   Discharge Instructions  Discharge Instructions     (HEART FAILURE PATIENTS) Call MD:  Anytime you have any of the following symptoms: 1) 3 pound weight gain in 24 hours or 5 pounds in 1 week 2) shortness of breath, with or without a dry hacking cough 3) swelling in the hands, feet or stomach 4) if you have to sleep on extra pillows at night in order to breathe.   Complete by: As directed    Call MD for:  persistant dizziness or light-headedness   Complete by: As directed    Diet - low sodium heart healthy   Complete by: As directed    Fluid restriction less than 2 L/day   Discharge  instructions   Complete by: As directed    1.  Start taking Lasix  20 mg daily. 2.  Continue other medications as you have been taking prior to admission.   Increase activity slowly   Complete by: As directed       Allergies as of 09/25/2024       Reactions   Zetia [ezetimibe] Other (See Comments)   Myalgias    Zocor [simvastatin] Other (See Comments)   Myalgias    Entresto  [sacubitril -valsartan ] Other (See Comments)   Discontinued by MD due to persistent hypotension    Imdur  [isosorbide  Nitrate] Other (See Comments)   Discontinued by MD due to persistent hypotension with isosorbide  ER 60mg .   Toprol  Xl [metoprolol ] Other (See Comments)   Discontinued by MD due to persistent hypotension at 12.5mg     Dilaudid  [hydromorphone  Hcl] Other (See Comments)   Hallucinations         Medication List     TAKE these medications    Acetaminophen  Extra Strength 500 MG Tabs Take 2 tablets (1,000 mg total) by mouth every 6 (six) hours. What changed:  when to take this reasons to take this   allopurinol  100 MG tablet Commonly known as: ZYLOPRIM  Take 0.5 tablets (50 mg total) by mouth every other day. What changed: Another medication with the same name was removed. Continue taking this medication, and follow the directions  you see here.   clopidogrel  75 MG tablet Commonly known as: PLAVIX  Take 1 tablet (75 mg total) by mouth daily.   Dexcom G7 Sensor Misc 1 Device by Does not apply route as directed.   dicyclomine  10 MG capsule Commonly known as: BENTYL  Take 1 capsule (10 mg total) by mouth 3 (three) times daily as needed for spasms.   furosemide  20 MG tablet Commonly known as: LASIX  Take 1 tablet (20 mg total) by mouth daily as needed. What changed: when to take this   furosemide  20 MG tablet Commonly known as: Lasix  Take 1 tablet (20 mg total) by mouth daily. What changed: You were already taking a medication with the same name, and this prescription was added. Make sure  you understand how and when to take each.   gabapentin  300 MG capsule Commonly known as: NEURONTIN  Take 1 capsule (300 mg total) by mouth at bedtime. What changed: when to take this   guaiFENesin  600 MG 12 hr tablet Commonly known as: MUCINEX  Take 1 tablet (600 mg total) by mouth 2 (two) times daily.   Insulin  Pen Needle 32G X 4 MM Misc 1 Device by Does not apply route in the morning, at noon, in the evening, and at bedtime.   Jardiance  25 MG Tabs tablet Generic drug: empagliflozin  Take 25 mg by mouth daily.   MAGNESIUM  PO Take 1 tablet by mouth daily.   meclizine  12.5 MG tablet Commonly known as: ANTIVERT  Take 12.5 mg by mouth daily.   metFORMIN  1000 MG tablet Commonly known as: GLUCOPHAGE  Take 1,000 mg by mouth 2 (two) times daily.   nitroGLYCERIN  0.4 MG SL tablet Commonly known as: NITROSTAT  Place 1 tablet (0.4 mg total) under the tongue every 5 (five) minutes x 3 doses as needed for chest pain.   NovoLOG  FlexPen 100 UNIT/ML FlexPen Generic drug: insulin  aspart Max daily 30 units What changed:  how much to take how to take this when to take this additional instructions   pantoprazole  40 MG tablet Commonly known as: PROTONIX  Take 1 tablet (40 mg total) by mouth 2 (two) times daily.   potassium chloride  SA 20 MEQ tablet Commonly known as: KLOR-CON  M Take 20 mEq by mouth every evening.   rosuvastatin  20 MG tablet Commonly known as: CRESTOR  Take 1 tablet (20 mg total) by mouth daily. What changed: when to take this   sucralfate  1 GM/10ML suspension Commonly known as: Carafate  Take 10 mLs (1 g total) by mouth 2 (two) times daily.   terazosin  5 MG capsule Commonly known as: HYTRIN  Take 5 mg by mouth 2 (two) times daily.   Tresiba  FlexTouch 100 UNIT/ML FlexTouch Pen Generic drug: insulin  degludec Inject 5 Units into the skin daily. What changed: when to take this   TURMERIC CURCUMIN PO Take 1 tablet by mouth daily.   VITAMIN B-12 PO Take 1 tablet by  mouth daily.   VITAMIN C PO Take 1 tablet by mouth daily.   VITAMIN D -3 PO Take 1 capsule by mouth daily.        Follow-up Information     [facility]. Go in 16 day(s).   Specialty: Cardiology Why: Hospital follow up 1,9,2026 @ 10:30 am PLEASE bring a current medication list to appointment FREE valet parking, Entrance C, off [facility] entrance Contact information: [address] [place]  [phone]        [facility]) Follow up.   Specialty: Home Health Services Why: Physical and Occupational Therapy-office to call with visit times. Contact information: [address] [place]  [address] [phone]        Keith Espinoza LABOR, MD. Schedule an appointment as soon as possible for a visit in 1 week(s).   Specialty: Internal Medicine Contact information: [address] [place] [address] [phone]                Allergies[1]  Consultations:    Procedures/Studies: US  Venous Img Lower  Left (DVT Study) Result Date: 09/23/2024 CLINICAL DATA:  Swelling. EXAM: LEFT LOWER EXTREMITY VENOUS DOPPLER ULTRASOUND TECHNIQUE: Gray-scale sonography with compression, as well as color and duplex ultrasound, were performed to evaluate the deep venous system(s) from the level of the common femoral vein through the popliteal and proximal calf veins. COMPARISON:  None Available. FINDINGS: VENOUS Normal compressibility of the common femoral, superficial femoral, and popliteal veins, as well as the visualized calf veins. Visualized portions of profunda femoral vein and great saphenous vein unremarkable. No filling defects to suggest DVT on grayscale or color Doppler imaging. Doppler waveforms show normal direction of venous flow, normal respiratory plasticity and response to  augmentation. Limited views of the contralateral common femoral vein are unremarkable. OTHER Subcutaneous edema. Limitations: Soft tissue edema limits assessment of the calf veins. IMPRESSION: No evidence of left lower extremity DVT. Electronically Signed   By: Andrea Gasman M.D.   On: 09/23/2024 12:29   CT CHEST ABDOMEN PELVIS WO CONTRAST Result Date: 09/23/2024 EXAM: CT CHEST, ABDOMEN AND PELVIS WITHOUT CONTRAST 09/23/2024 11:29:26 AM TECHNIQUE: CT of the chest, abdomen and pelvis was performed without the administration of intravenous contrast. Multiplanar reformatted images are provided for review. Automated exposure control, iterative reconstruction, and/or weight based adjustment of the mA/kV was utilized to reduce the radiation dose to as low as reasonably achievable. COMPARISON: None available. CLINICAL HISTORY: Cough and pain. FINDINGS: CHEST: MEDIASTINUM AND LYMPH NODES: Heart and pericardium are unremarkable. The central airways are clear. No mediastinal, hilar or axillary lymphadenopathy. LUNGS AND PLEURA: Mild intralobular septal thickening scattered throughout the lungs. Small to moderate volume right pleural effusion with compressive atelectasis. Trace left pleural effusion. Pulmonary cystectomy. No focal consolidation or pulmonary edema. No pneumothorax. ABDOMEN AND PELVIS: LIVER: The liver is unremarkable. GALLBLADDER AND BILE DUCTS: Gallbladder is unremarkable. No biliary ductal dilatation. SPLEEN: No acute abnormality. PANCREAS: No acute abnormality. ADRENAL GLANDS: 0.8 cm left adrenal nodule, likely an adrenal adenoma. KIDNEYS, URETERS AND BLADDER: Mild renal cortical atrophy of both kidneys. Multiple small nonobstructive calyceal calculi. No hydronephrosis. No perinephric or periureteral stranding. The urinary bladder is distended, without focal abnormality. GI AND BOWEL: Stomach demonstrates no acute abnormality. Scattered colonic diverticulosis. Normal decompressed gas filled appendix,  best visualized on the coronal images. There is no bowel obstruction. REPRODUCTIVE ORGANS: Moderate prostatomegaly. PERITONEUM AND RETROPERITONEUM: Mesenteric edema with small volume ascites. No free air. VASCULATURE: Extensive, multivessel coronary atherosclerosis. Diffuse calcified atherosclerosis throughout the aorta and great vessels. Diffuse aortoiliac atherosclerosis with calcified atherosclerosis throughout the mesenteric vessels. ABDOMINAL AND PELVIS LYMPH NODES: No lymphadenopathy. BONES AND SOFT TISSUES: Diffuse osteopenia. Rotator cuff anchors are present in the right humeral head. Right hip arthroplasty is anatomically aligned without dislocation. Multilevel degenerative disc disease of the spine. Mild diffuse anasarca. IMPRESSION: 1. Mild cardiomegaly with findings of minimal interstitial edema. Small to moderate right and trace left pleural effusions. 2. Mild mesenteric edema, small  volume ascites, and anasarca, also likely related to the patient's volume status. 3. Small nonobstructive bilateral renal calculi. No hydronephrosis in the kidney. 4. Scattered colonic diverticulosis. No changes of acute diverticulitis. Electronically signed by: Rogelia Myers MD 09/23/2024 12:03 PM EST RP Workstation: [id number]   DG CHEST PORT 1 VIEW Result Date: 09/17/2024 CLINICAL DATA:  Acute renal insufficiency. EXAM: PORTABLE CHEST 1 VIEW COMPARISON:  Chest radiograph dated 09/15/2024. FINDINGS: Mild eventration of the right hemidiaphragm with minimal right lung base atelectasis. X-rays right pleural effusion may be present. No pneumothorax. The cardiac silhouette is within normal limits. No acute osseous pathology. IMPRESSION: Minimal right lung base atelectasis. No focal consolidation. Electronically Signed   By: Vanetta Chou M.D.   On: 09/17/2024 08:59   US  RENAL Result Date: 09/16/2024 CLINICAL DATA:  [id number] AKI (acute kidney injury) [id number] EXAM: RENAL / URINARY TRACT ULTRASOUND COMPLETE COMPARISON:   September 09, 2024 FINDINGS: Right Kidney: Renal measurements: 10.7 x 5.2 x 5.4 cm = volume: 156 mL. Echogenicity is increased. No mass or hydronephrosis visualized. 11 mm echogenic focus with twinkle artifact consistent with a nonobstructing nephrolithiasis. Portions are suboptimally assessed secondary to shadowing bowel gas. Left Kidney: Renal measurements: 9.6 x 5.4 x 3.3 cm = volume: 88 mL. Echogenicity is increased. No mass or hydronephrosis visualized. Portions are suboptimally assessed secondary to shadowing bowel gas. Bladder: Decompressed with mild circumferential wall prominence likely reflecting sequela of chronic outlet obstruction. Other: Small volume ascites. IMPRESSION: 1. No hydronephrosis. 2. Increased echogenicity of the kidneys as can be seen in medical renal disease. 3. Nonobstructing right nephrolithiasis. 4. Small volume ascites. Electronically Signed   By: Corean Salter M.D.   On: 09/16/2024 14:41   DG Chest Port 1 View Result Date: 09/15/2024 EXAM: 1 VIEW(S) XRAY OF THE CHEST 09/15/2024 12:40:23 PM COMPARISON: 07/03/2023 CLINICAL HISTORY: Questionable sepsis - evaluate for abnormality FINDINGS: LUNGS AND PLEURA: Low lung volumes with diffuse coarsened and attenuated bronchovascular markings. Right basilar atelectasis. Right lower lung zone nodular like 1.5 cm spiculated airspace opacity. Trace right pleural effusion. No pneumothorax. HEART AND MEDIASTINUM: Aortic atherosclerosis. BONES AND SOFT TISSUES: No acute osseous abnormality. IMPRESSION: 1. Right lower lung zone nodular like 1.5 cm spiculated airspace opacity. Finding not visualized on CT chest 09/09/24 and likely consolidation/infection. Follow-up PA and lateral chest X-ray is recommended in 3-4 weeks following therapy to ensure resolution. 2. Trace right pleural effusion. Electronically signed by: Morgane Naveau MD 09/15/2024 01:13 PM EST RP Workstation: [id number]   VAS US  ABI WITH/WO TBI Result Date: 09/14/2024  LOWER  EXTREMITY DOPPLER STUDY Patient Name:  Adriann Ballweg  Date of Exam:   09/14/2024 Medical Rec #: [id number]               Accession #:    [id number] Date of Birth: June 13, 1941               Patient Gender: M Patient Age:   83 years Exam Location:  [address] Procedure:      VAS US  ABI WITH/WO TBI Referring Phys: NORMAN SERVE --------------------------------------------------------------------------------  Indications: Peripheral artery disease. Patieint reports swelling, numbness and              coldness to the left foot. Right BKA. High Risk Factors: Hypertension, hyperlipidemia, Diabetes, past history of                    smoking, coronary artery disease. Current smokeless tobacco. Other Factors: Right below knee amputation on 12/12/2020.  Comparison  Study: On 06/23/2023, a lower arterial Doppler showed an ABI of .69                   on the left. Right BKA. Performing Technologist: Nanetta Shad RVT  Examination Guidelines: A complete evaluation includes at minimum, Doppler waveform signals and systolic blood pressure reading at the level of bilateral brachial, anterior tibial, and posterior tibial arteries, when vessel segments are accessible. Bilateral testing is considered an integral part of a complete examination. Photoelectric Plethysmograph (PPG) waveforms and toe systolic pressure readings are included as required and additional duplex testing as needed. Limited examinations for reoccurring indications may be performed as noted.  ABI Findings: +--------+------------------+-----+--------+--------+ Right   Rt Pressure (mmHg)IndexWaveformComment  +--------+------------------+-----+--------+--------+ [id number]                                     +--------+------------------+-----+--------+--------+ +---------+------------------+-----+--------+-------+ Left     Lt Pressure (mmHg)IndexWaveformComment +---------+------------------+-----+--------+-------+ Brachial 113                                     +---------+------------------+-----+--------+-------+ PTA      107               0.88                 +---------+------------------+-----+--------+-------+ DP       84                0.69                 +---------+------------------+-----+--------+-------+ Great Toe72                0.60                 +---------+------------------+-----+--------+-------+  Left ABIs appear increased compared to prior study on 06/20/2023. Left TBIs appear essentially unchanged compared to prior study on 06/20/2023.  Summary: Right:  Right BKA. Left: Resting left ankle-brachial index indicates mild left lower extremity arterial disease. The left toe-brachial index is abnormal. Left toe pressure is >60 mmHg which suggests adequate perfusion for healing. *See table(s) above for measurements and observations.  Electronically signed by Norman Serve on 09/14/2024 at 1:32:17 PM.    Final    CT Chest Wo Contrast Result Date: 09/09/2024 EXAM: CT CHEST WITHOUT CONTRAST 09/09/2024 09:13:27 PM TECHNIQUE: CT of the chest was performed without the administration of intravenous contrast. Multiplanar reformatted images are provided for review. Automated exposure control, iterative reconstruction, and/or weight based adjustment of the mA/kV was utilized to reduce the radiation dose to as low as reasonably achievable. COMPARISON: 05/14/2024. CLINICAL HISTORY: Chest wall mass, US /xray nondiagnostic. FINDINGS: MEDIASTINUM: Diffuse calcifications throughout the coronary arteries. Moderate aortic atherosclerosis. The central airways are clear. LYMPH NODES: No mediastinal, hilar or axillary lymphadenopathy. LUNGS AND PLEURA: Small bilateral pleural effusions. Dependent and bibasilar atelectasis. No focal consolidation or pulmonary edema. No pneumothorax. SOFT TISSUES/BONES: No chest wall mass visualized. No acute abnormality of the bones. UPPER ABDOMEN: Limited images of the upper abdomen demonstrates no  acute abnormality. IMPRESSION: 1. No chest wall mass visualized. 2. Small bilateral pleural effusions with dependent and bibasilar atelectasis. 3. Diffuse coronary artery disease, aortic atherosclerosis Electronically signed by: Franky Crease MD 09/09/2024 09:22 PM EST RP Workstation: [id number]   CT ABDOMEN PELVIS W CONTRAST Result Date: 09/09/2024 EXAM: CT ABDOMEN AND  PELVIS WITH CONTRAST 09/09/2024 09:13:01 PM TECHNIQUE: CT of the abdomen and pelvis was performed with the administration of 70 mL of iohexol  (OMNIPAQUE ) 300 MG/ML solution. Multiplanar reformatted images are provided for review. Automated exposure control, iterative reconstruction, and/or weight-based adjustment of the mA/kV was utilized to reduce the radiation dose to as low as reasonably achievable. COMPARISON: 05/14/2024 CLINICAL HISTORY: Abdominal pain, acute, nonlocalized. FINDINGS: LOWER CHEST: Small bilateral pleural effusions. Diffuse calcifications throughout the visualized coronary arteries. LIVER: The liver is unremarkable. GALLBLADDER AND BILE DUCTS: Prior cholecystectomy. No biliary ductal dilatation. SPLEEN: No acute abnormality. PANCREAS: No acute abnormality. ADRENAL GLANDS: No acute abnormality. KIDNEYS, URETERS AND BLADDER: Bilateral nephrolithiasis. No ureteral stones or hydronephrosis. No perinephric or periureteral stranding. Urinary bladder is unremarkable. GI AND BOWEL: Stomach demonstrates no acute abnormality. There is no bowel obstruction. PERITONEUM AND RETROPERITONEUM: No ascites. No free air. VASCULATURE: Aortic atherosclerosis. Diffuse aortoiliac atherosclerosis. Aorta is normal in caliber. LYMPH NODES: No lymphadenopathy. REPRODUCTIVE ORGANS: Prostate enlargement with calcifications. BONES AND SOFT TISSUES: Right hip replacement. No acute osseous abnormality. No focal soft tissue abnormality. IMPRESSION: 1. No acute findings in the abdomen or pelvis. 2. Bilateral nephrolithiasis. No hydronephrosis. 3. Diffuse coronary  artery disease, aortic atherosclerosis. Electronically signed by: Franky Crease MD 09/09/2024 09:19 PM EST RP Workstation: [id number]      Subjective:   Discharge Exam: Vitals:   09/25/24 0733 09/25/24 1137  BP: 107/77 (!) 88/55  Pulse: 86 85  Resp: 16 17  Temp: 98.2 F (36.8 C) 97.9 F (36.6 C)  SpO2: 100% 100%   Constitutional: Elderly male who appears chronically ill NAD, calm, comfortable Eyes: PERRL, lids and conjunctivae normal ENMT: Mucous membranes are moist.   Neck: normal, supple  Respiratory: Normal respiratory effort with some intermittent crackles noted in the lower lung fields..  Cardiovascular: Irregularly irregular.  At least 1+ pitting lower extremity edema present.. 2+ pedal pulses. No carotid bruits.  Abdomen: Mild abdominal distention.  No tenderness to palpation.  Bowel sounds positive.  Musculoskeletal: no clubbing / cyanosis.  Right below-knee amputation present. Skin: Stasis changes of the left lower extremity. Neurologic: CN 2-12 grossly intact.  Able to move all extremities Psychiatric: Normal judgment and insight. Alert and oriented x 3. Normal mood.        The results of significant diagnostics from this hospitalization (including imaging, microbiology, ancillary and laboratory) are listed below for reference.     Microbiology: Recent Results (from the past 240 hours)  Respiratory (~20 pathogens) panel by PCR     Status: Abnormal   Collection Time: 09/15/24 10:25 PM   Specimen: Nasopharyngeal Swab; Respiratory  Result Value Ref Range Status   Adenovirus NOT DETECTED NOT DETECTED Final   Coronavirus 229E NOT DETECTED NOT DETECTED Final    Comment: (NOTE) The Coronavirus on the Respiratory Panel, DOES NOT test for the novel  Coronavirus (2019 nCoV)    Coronavirus HKU1 NOT DETECTED NOT DETECTED Final   Coronavirus NL63 NOT DETECTED NOT DETECTED Final   Coronavirus OC43 NOT DETECTED NOT DETECTED Final   Metapneumovirus NOT DETECTED NOT  DETECTED Final   Rhinovirus / Enterovirus DETECTED (A) NOT DETECTED Final   Influenza A NOT DETECTED NOT DETECTED Final   Influenza B NOT DETECTED NOT DETECTED Final   Parainfluenza Virus 1 NOT DETECTED NOT DETECTED Final   Parainfluenza Virus 2 NOT DETECTED NOT DETECTED Final   Parainfluenza Virus 3 NOT DETECTED NOT DETECTED Final   Parainfluenza Virus 4 NOT DETECTED NOT DETECTED Final   Respiratory  Syncytial Virus NOT DETECTED NOT DETECTED Final   Bordetella pertussis NOT DETECTED NOT DETECTED Final   Bordetella Parapertussis NOT DETECTED NOT DETECTED Final   Chlamydophila pneumoniae NOT DETECTED NOT DETECTED Final   Mycoplasma pneumoniae NOT DETECTED NOT DETECTED Final    Comment: Performed at [facility], [address], [place], [place] [address]  Resp panel by RT-PCR (RSV, Flu A&B, Covid) Anterior Nasal Swab     Status: None   Collection Time: 09/23/24 11:17 AM   Specimen: Anterior Nasal Swab  Result Value Ref Range Status   SARS Coronavirus 2 by RT PCR NEGATIVE NEGATIVE Final    Comment: (NOTE) SARS-CoV-2 target nucleic acids are NOT DETECTED.  The SARS-CoV-2 RNA is generally detectable in upper respiratory specimens during the acute phase of infection. The lowest concentration of SARS-CoV-2 viral copies this assay can detect is 138 copies/mL. A negative result does not preclude SARS-Cov-2 infection and should not be used as the sole basis for treatment or other patient management decisions. A negative result may occur with  improper specimen collection/handling, submission of specimen other than nasopharyngeal swab, presence of viral mutation(s) within the areas targeted by this assay, and inadequate number of viral copies(<138 copies/mL). A negative result must be combined with clinical observations, patient history, and epidemiological information. The expected result is Negative.  Fact Sheet for Patients:  bloggercourse.com  Fact Sheet  for Healthcare Providers:  seriousbroker.it  This test is no t yet approved or cleared by the [place]  FDA and  has been authorized for detection and/or diagnosis of SARS-CoV-2 by FDA under an Emergency Use Authorization (EUA). This EUA will remain  in effect (meaning this test can be used) for the duration of the COVID-19 declaration under Section 564(b)(1) of the Act, 21 U.S.C.section 360bbb-3(b)(1), unless the authorization is terminated  or revoked sooner.       Influenza A by PCR NEGATIVE NEGATIVE Final   Influenza B by PCR NEGATIVE NEGATIVE Final    Comment: (NOTE) The Xpert Xpress SARS-CoV-2/FLU/RSV plus assay is intended as an aid in the diagnosis of influenza from Nasopharyngeal swab specimens and should not be used as a sole basis for treatment. Nasal washings and aspirates are unacceptable for Xpert Xpress SARS-CoV-2/FLU/RSV testing.  Fact Sheet for Patients: bloggercourse.com  Fact Sheet for Healthcare Providers: seriousbroker.it  This test is not yet approved or cleared by the [place]  FDA and has been authorized for detection and/or diagnosis of SARS-CoV-2 by FDA under an Emergency Use Authorization (EUA). This EUA will remain in effect (meaning this test can be used) for the duration of the COVID-19 declaration under Section 564(b)(1) of the Act, 21 U.S.C. section 360bbb-3(b)(1), unless the authorization is terminated or revoked.     Resp Syncytial Virus by PCR NEGATIVE NEGATIVE Final    Comment: (NOTE) Fact Sheet for Patients: bloggercourse.com  Fact Sheet for Healthcare Providers: seriousbroker.it  This test is not yet approved or cleared by the [place]  FDA and has been authorized for detection and/or diagnosis of SARS-CoV-2 by FDA under an Emergency Use Authorization (EUA). This EUA will remain in effect (meaning  this test can be used) for the duration of the COVID-19 declaration under Section 564(b)(1) of the Act, 21 U.S.C. section 360bbb-3(b)(1), unless the authorization is terminated or revoked.  Performed at [facility], [address], [place], [place] [address]      Labs: BNP (last 3 results) No results for input(s): BNP in the last 8760 hours. Basic Metabolic  Panel: Recent Labs  Lab 09/19/24 0525 09/23/24 1117 09/24/24 0512 09/25/24 0445  NA 134* 140 139 134*  K 4.1 3.5 2.9* 4.4  CL 98 100 100 97*  CO2 20* 25 27 24   GLUCOSE 247* 189* 150* 263*  BUN 32* 19 19 21   CREATININE 2.39* 2.55* 2.50* 2.37*  CALCIUM  8.8* 9.7 9.1 9.1  MG  --   --  1.5*  --    Liver Function Tests: Recent Labs  Lab 09/23/24 1117  AST 45*  ALT 89*  ALKPHOS 95  BILITOT 0.5  PROT 6.4*  ALBUMIN  3.7   Recent Labs  Lab 09/23/24 1117  LIPASE 42   No results for input(s): AMMONIA in the last 168 hours. CBC: Recent Labs  Lab 09/19/24 0525 09/23/24 1117 09/24/24 0512  WBC 5.7 5.7 6.2  NEUTROABS  --  2.8  --   HGB 9.6* 10.3* 9.2*  HCT 30.1* 31.6* 28.1*  MCV 100.0 97.2 95.6  PLT 81* 80* 72*   Cardiac Enzymes: No results for input(s): CKTOTAL, CKMB, CKMBINDEX, TROPONINI in the last 168 hours. BNP: Invalid input(s): POCBNP CBG: Recent Labs  Lab 09/24/24 1201 09/24/24 1603 09/24/24 2116 09/25/24 0736 09/25/24 1139  GLUCAP 211* 251* 255* 271* 267*   D-Dimer No results for input(s): DDIMER in the last 72 hours. Hgb A1c No results for input(s): HGBA1C in the last 72 hours. Lipid Profile No results for input(s): CHOL, HDL, LDLCALC, TRIG, CHOLHDL, LDLDIRECT in the last 72 hours. Thyroid  function studies No results for input(s): TSH, T4TOTAL, T3FREE, THYROIDAB in the last 72 hours.  Invalid input(s): FREET3 Anemia work up No results for input(s): VITAMINB12, FOLATE, FERRITIN, TIBC, IRON , RETICCTPCT in the last 72  hours. Urinalysis    Component Value Date/Time   COLORURINE COLORLESS (A) 09/23/2024 1117   APPEARANCEUR CLEAR 09/23/2024 1117   LABSPEC 1.007 09/23/2024 1117   PHURINE 5.5 09/23/2024 1117   GLUCOSEU >1,000 (A) 09/23/2024 1117   HGBUR SMALL (A) 09/23/2024 1117   BILIRUBINUR NEGATIVE 09/23/2024 1117   KETONESUR NEGATIVE 09/23/2024 1117   PROTEINUR 30 (A) 09/23/2024 1117   NITRITE NEGATIVE 09/23/2024 1117   LEUKOCYTESUR NEGATIVE 09/23/2024 1117   Sepsis Labs Recent Labs  Lab 09/19/24 0525 09/23/24 1117 09/24/24 0512  WBC 5.7 5.7 6.2   Microbiology Recent Results (from the past 240 hours)  Respiratory (~20 pathogens) panel by PCR     Status: Abnormal   Collection Time: 09/15/24 10:25 PM   Specimen: Nasopharyngeal Swab; Respiratory  Result Value Ref Range Status   Adenovirus NOT DETECTED NOT DETECTED Final   Coronavirus 229E NOT DETECTED NOT DETECTED Final    Comment: (NOTE) The Coronavirus on the Respiratory Panel, DOES NOT test for the novel  Coronavirus (2019 nCoV)    Coronavirus HKU1 NOT DETECTED NOT DETECTED Final   Coronavirus NL63 NOT DETECTED NOT DETECTED Final   Coronavirus OC43 NOT DETECTED NOT DETECTED Final   Metapneumovirus NOT DETECTED NOT DETECTED Final   Rhinovirus / Enterovirus DETECTED (A) NOT DETECTED Final   Influenza A NOT DETECTED NOT DETECTED Final   Influenza B NOT DETECTED NOT DETECTED Final   Parainfluenza Virus 1 NOT DETECTED NOT DETECTED Final   Parainfluenza Virus 2 NOT DETECTED NOT DETECTED Final   Parainfluenza Virus 3 NOT DETECTED NOT DETECTED Final   Parainfluenza Virus 4 NOT DETECTED NOT DETECTED Final   Respiratory Syncytial Virus NOT DETECTED NOT DETECTED Final   Bordetella pertussis NOT DETECTED NOT DETECTED Final   Bordetella Parapertussis NOT DETECTED NOT  DETECTED Final   Chlamydophila pneumoniae NOT DETECTED NOT DETECTED Final   Mycoplasma pneumoniae NOT DETECTED NOT DETECTED Final    Comment: Performed at [facility]  [facility], [address], [place], [place] [address]  Resp panel by RT-PCR (RSV, Flu A&B, Covid) Anterior Nasal Swab     Status: None   Collection Time: 09/23/24 11:17 AM   Specimen: Anterior Nasal Swab  Result Value Ref Range Status   SARS Coronavirus 2 by RT PCR NEGATIVE NEGATIVE Final    Comment: (NOTE) SARS-CoV-2 target nucleic acids are NOT DETECTED.  The SARS-CoV-2 RNA is generally detectable in upper respiratory specimens during the acute phase of infection. The lowest concentration of SARS-CoV-2 viral copies this assay can detect is 138 copies/mL. A negative result does not preclude SARS-Cov-2 infection and should not be used as the sole basis for treatment or other patient management decisions. A negative result may occur with  improper specimen collection/handling, submission of specimen other than nasopharyngeal swab, presence of viral mutation(s) within the areas targeted by this assay, and inadequate number of viral copies(<138 copies/mL). A negative result must be combined with clinical observations, patient history, and epidemiological information. The expected result is Negative.  Fact Sheet for Patients:  bloggercourse.com  Fact Sheet for Healthcare Providers:  seriousbroker.it  This test is no t yet approved or cleared by the [place]  FDA and  has been authorized for detection and/or diagnosis of SARS-CoV-2 by FDA under an Emergency Use Authorization (EUA). This EUA will remain  in effect (meaning this test can be used) for the duration of the COVID-19 declaration under Section 564(b)(1) of the Act, 21 U.S.C.section 360bbb-3(b)(1), unless the authorization is terminated  or revoked sooner.       Influenza A by PCR NEGATIVE NEGATIVE Final   Influenza B by PCR NEGATIVE NEGATIVE Final    Comment: (NOTE) The Xpert Xpress SARS-CoV-2/FLU/RSV plus assay is intended as an aid in the diagnosis of influenza from  Nasopharyngeal swab specimens and should not be used as a sole basis for treatment. Nasal washings and aspirates are unacceptable for Xpert Xpress SARS-CoV-2/FLU/RSV testing.  Fact Sheet for Patients: bloggercourse.com  Fact Sheet for Healthcare Providers: seriousbroker.it  This test is not yet approved or cleared by the [place]  FDA and has been authorized for detection and/or diagnosis of SARS-CoV-2 by FDA under an Emergency Use Authorization (EUA). This EUA will remain in effect (meaning this test can be used) for the duration of the COVID-19 declaration under Section 564(b)(1) of the Act, 21 U.S.C. section 360bbb-3(b)(1), unless the authorization is terminated or revoked.     Resp Syncytial Virus by PCR NEGATIVE NEGATIVE Final    Comment: (NOTE) Fact Sheet for Patients: bloggercourse.com  Fact Sheet for Healthcare Providers: seriousbroker.it  This test is not yet approved or cleared by the [place]  FDA and has been authorized for detection and/or diagnosis of SARS-CoV-2 by FDA under an Emergency Use Authorization (EUA). This EUA will remain in effect (meaning this test can be used) for the duration of the COVID-19 declaration under Section 564(b)(1) of the Act, 21 U.S.C. section 360bbb-3(b)(1), unless the authorization is terminated or revoked.  Performed at [facility], [address] [place], [place] [address]      Time coordinating discharge: 35 minutes  SIGNED:   Derryl Duval, MD  [facility] 09/25/2024, 1:44 PM       [1]  Allergies Allergen Reactions   Zetia [Ezetimibe] Other (See Comments)    Myalgias  Zocor [Simvastatin] Other (See Comments)    Myalgias     Entresto  [Sacubitril -Valsartan ] Other (See Comments)    Discontinued by MD due to persistent hypotension    Imdur  [Isosorbide  Nitrate] Other (See  Comments)    Discontinued by MD due to persistent hypotension with isosorbide  ER 60mg .   Toprol  Xl [Metoprolol ] Other (See Comments)    Discontinued by MD due to persistent hypotension at 12.5mg     Dilaudid  [Hydromorphone  Hcl] Other (See Comments)    Hallucinations

## 2024-09-25 NOTE — TOC Initial Note (Signed)
 Transition of Care [facility]) - Initial/Assessment Note    Patient Details  Name: Keith Espinoza MRN: [id number] Date of Birth: 06-03-1941  [facility] CM/SW Contact:    Sudie Erminio Deems, RN Phone Number: 09/25/2024, 10:28 AM  Clinical Narrative: Patient presented for leg swelling and abdominal pain. PTA patient was independent from home alone. Patient states family lives nearby. Patient has DMEcane, rolling walker, right lower extremity prosthesis. Patient states he has a PCP and daughter takes him to appointments. ICM discussed with patient regarding home health and patient has used [facility] in the past and wants to use them again for PT/OT. MD is aware that orders are needed. Patient states granddaughter will provide transportation home via private vehicle. No further needs identified at this time.                  Expected Discharge Plan: Home w Home Health Services Barriers to Discharge: No Barriers Identified   Patient Goals and CMS Choice Patient states their goals for this hospitalization and ongoing recovery are:: Plans to return home   Choice offered to / list presented to : Patient (Patient has used [facility] in the past and asked for this agency. Medicare.gov list not needed.)      Expected Discharge Plan and Services In-house Referral: NA Discharge Planning Services: CM Consult Post Acute Care Choice: Home Health Living arrangements for the past 2 months: Single Family Home                   DME Agency: NA       HH Arranged: PT, OT HH Agency: [facility]) Date HH Agency Contacted: 09/25/24 Time HH Agency Contacted: 1027 Representative spoke with at [facility] Agency: [facility]  Prior Living Arrangements/Services Living arrangements for the past 2 months: Single Family Home Lives with:: Self Patient language and need for interpreter reviewed:: Yes Do you feel safe going back to the place where you live?: Yes      Need for  Family Participation in Patient Care: No (Comment) Care giver support system in place?: No (comment) Current home services: DME (cane, rolling walker, right lower extremity prosthesis.) Criminal Activity/Legal Involvement Pertinent to Current Situation/Hospitalization: No - Comment as needed  Activities of Daily Living   ADL Screening (condition at time of admission) Independently performs ADLs?: Yes (appropriate for developmental age) Is the patient deaf or have difficulty hearing?: No Does the patient have difficulty seeing, even when wearing glasses/contacts?: Yes Does the patient have difficulty concentrating, remembering, or making decisions?: No  Permission Sought/Granted Permission sought to share information with : Case Manager, Family Supports       Permission granted to share info w AGENCY: [facility]        Emotional Assessment Appearance:: Appears stated age Attitude/Demeanor/Rapport: Engaged Affect (typically observed): Appropriate Orientation: : Oriented to Self, Oriented to Place, Oriented to  Time, Oriented to Situation Alcohol / Substance Use: Not Applicable Psych Involvement: No (comment)  Admission diagnosis:  Anasarca [R60.1] HFrEF (heart failure with reduced ejection fraction) (HCC) [I50.20] Acute heart failure, unspecified heart failure type (HCC) [I50.9] Hypervolemia, unspecified hypervolemia type [E87.70] Patient Active Problem List   Diagnosis Date Noted   HFrEF (heart failure with reduced ejection fraction) (HCC) 09/23/2024   Dysphagia 09/23/2024   Elevated liver enzymes 09/23/2024   Severe sepsis (HCC) 09/16/2024   Right lower lobe pneumonia 09/15/2024   Lactic acidosis 09/15/2024   Abdominal pain 09/15/2024   Lung nodules 05/15/2024   CKD  stage 3a, GFR 45-59 ml/min (HCC) 05/15/2024   Palliative care by specialist 05/15/2024   Goals of care, counseling/discussion 05/15/2024   Lightheadedness 05/14/2024   Chronic heart failure with mildly  reduced ejection fraction (HFmrEF, 41-49%) (HCC) 03/02/2024   Acute on chronic combined systolic and diastolic HF (heart failure) (HCC) 07/09/2023   Gastric AVM 07/08/2023   Diverticulosis of colon without hemorrhage 07/08/2023   Gastrointestinal hemorrhage associated with acute gastritis 07/08/2023   PSVT (paroxysmal supraventricular tachycardia) 07/08/2023   Paroxysmal A-fib (HCC) 07/08/2023   Grade I diastolic dysfunction 07/08/2023   Acute on chronic anemia 07/07/2023   Hypotension 07/07/2023   New onset atrial fibrillation (HCC) 07/07/2023   Ischemic cardiomyopathy 07/07/2023   Acute systolic CHF (congestive heart failure) (HCC) 07/07/2023   Pressure injury of skin 07/07/2023   Blister 07/06/2023   Edema of both lower extremities 07/05/2023   CHF exacerbation (HCC) 07/05/2023   Iron  deficiency anemia 07/05/2023   Shortness of breath 07/03/2023   Finger numbness 05/05/2023   Elevated troponin 04/25/2023   Chest pain of uncertain etiology 04/23/2023   Unstable angina (HCC) 04/23/2023   Acute kidney injury superimposed on chronic kidney disease 04/23/2023   History of CAD (coronary artery disease) 04/23/2023   Contusion of right hip 03/08/2023   Type 2 diabetes mellitus with hyperglycemia, with long-term current use of insulin  (HCC) 02/15/2022   Adenoma of left adrenal gland 02/15/2022   History of right below knee amputation (HCC) 10/06/2021   Exudative age-related macular degeneration of left eye with active choroidal neovascularization (HCC) 07/28/2021   Exudative age-related macular degeneration of right eye with active choroidal neovascularization (HCC) 06/01/2021   Intermediate stage nonexudative age-related macular degeneration of both eyes 06/01/2021   Type 2 diabetes mellitus with diabetic polyneuropathy, with long-term current use of insulin  (HCC) 03/24/2021   Constipation 02/02/2021   Fecal impaction (HCC) 02/01/2021   Wound dehiscence 01/09/2021   Dehiscence of  amputation stump (HCC)    Status post percutaneous transluminal coronary angioplasty 01/06/2021   Type II diabetes mellitus, uncontrolled 01/06/2021   Acute pancreatitis 01/06/2021   Diabetic peripheral vascular disease (HCC) 01/06/2021   Encounter for screening for other disorder 01/06/2021   Enlarged prostate 01/06/2021   Foot ulcer, right (HCC) 01/06/2021   Gout 01/06/2021   Headache 01/06/2021   Neck pain 01/06/2021   Hypoglycemia 01/06/2021   Loss of appetite 01/06/2021   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   Peripheral neuropathy 01/06/2021   Sciatica 01/06/2021   Vitamin B12 deficiency 01/06/2021   Vitamin D  deficiency 01/06/2021   Weakness 01/06/2021   Diabetes mellitus type 2 with neurological manifestations (HCC) 01/06/2021   Protein-calorie malnutrition, severe 12/26/2020   Acute blood loss anemia 12/26/2020   Prerenal azotemia 12/26/2020   Below-knee amputation of right lower extremity (HCC) 12/12/2020   Gangrene of right foot (HCC)    Diabetic neuropathy (HCC) 11/17/2020   Hyperglycemia due to type 2 diabetes mellitus (HCC) 11/17/2020   Long term (current) use of insulin  (HCC) 11/17/2020   Obesity 11/17/2020   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/2019   Hip dislocation, right (HCC) 04/13/2019   Other intervertebral disc degeneration, lumbar region 03/30/2019   Coronary artery disease 01/30/2019   Tobacco abuse 01/30/2019   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   Burn, foot, second degree, left, initial encounter 06/08/2017   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 03/16/2017   S/P total knee arthroplasty, left 10/26/2016   Hyperlipidemia 09/04/2014   Thrombocytopenia    Precordial chest  pain 04/05/2014   Coronary atherosclerosis of native coronary artery 10/01/2013   Other and unspecified hyperlipidemia 10/01/2013   Hypertension 10/01/2013   Type 2 diabetes mellitus with complication, without long-term current use of insulin  (HCC)  10/01/2013   Esophageal reflux 10/01/2013   Hypertrophy of prostate without urinary obstruction and other lower urinary tract symptoms (LUTS) 10/01/2013   PCP:  Charlott Dorn LABOR, MD Pharmacy:   [facility] [phone] - [place], [place] - [address] [place] [address] Phone: [phone] Fax: [phone]  [email or web address] - [place], [place] - [phone] [facility] [address] [facility] [address] [place] [address] Phone: [phone] Fax: [phone]     Social Drivers of Health (SDOH) Social History: SDOH Screenings   Food Insecurity: No Food Insecurity (09/23/2024)  Housing: Unknown (09/24/2024)  Transportation Needs: No Transportation Needs (09/24/2024)  Utilities: Not At Risk (09/23/2024)  Alcohol Screen: Low Risk (09/24/2024)  Depression (PHQ2-9): Low Risk (04/21/2022)  Financial Resource Strain: Low Risk (09/24/2024)  Social Connections: Socially Isolated (09/23/2024)  Stress: No Stress Concern Present (06/12/2023)   Received from [facility]  Tobacco Use: High Risk (09/23/2024)   SDOH Interventions: Housing Interventions: Intervention Not Indicated Transportation Interventions: Intervention Not Indicated Alcohol Usage Interventions: Intervention Not Indicated (Score <7) Financial Strain Interventions: Intervention Not Indicated   Readmission Risk Interventions    09/25/2024   10:24 AM  Readmission Risk Prevention Plan  Transportation Screening Complete  HRI or Home Care Consult Complete  Social Work Consult for Recovery Care Planning/Counseling Complete  Palliative Care Screening Not Applicable  Medication Review Oceanographer) Referral to Pharmacy

## 2024-09-25 NOTE — Inpatient Diabetes Management (Signed)
 Inpatient Diabetes Program Recommendations  AACE/ADA: New Consensus Statement on Inpatient Glycemic Control   Target Ranges:  Prepandial:   less than 140 mg/dL      Peak postprandial:   less than 180 mg/dL (1-2 hours)      Critically ill patients:  140 - 180 mg/dL   Lab Results  Component Value Date   GLUCAP 271 (H) 09/25/2024   HGBA1C 7.9 (A) 06/08/2024    Latest Reference Range & Units 09/24/24 08:00 09/24/24 12:01 09/24/24 16:03 09/24/24 21:16 09/25/24 07:36  Glucose-Capillary 70 - 99 mg/dL 801 (H) 788 (H) 748 (H) 255 (H) 271 (H)   Review of Glycemic Control  Diabetes history: DM2  Outpatient Diabetes medications:  Dexcom G7  Jardiance  25mg  daily  Novolog  Sliding Scale BID (unsure of dose)  Tresiba  5 units daily  Metformin  1,000mg  BID   Current orders for Inpatient glycemic control:  Lantus  5 units daily  Novolog  0-9 units TID   Inpatient Diabetes Program Recommendations:   Please consider increasing Lantus  to 10 units daily.   Thanks,  Lavanda Search, RN, MSN, [facility]  Inpatient Diabetes Coordinator  Pager [phone] (8a-5p)

## 2024-09-25 NOTE — Progress Notes (Addendum)
 Mr Lineman reports he completed Advance Directives during his last admission and those documents should be on file with the health system. He expressed frustration that I did not have the information at hand and when asked if he would like to tell me the name of his health care agent for my note, he stated, who do you think I want?! You're not that dumb.[email or web address] want my dog and I don't have a dog.   Chaplain was able to locate pt's AD in pt's chart.  Alan HERO. Davee Lomax, M.Div. [facility] Chaplain Pager [phone] Office [phone]   09/25/24 1040  Spiritual Encounters  Type of Visit Initial  Care provided to: Pt and family  Reason for visit Advance directives

## 2024-09-25 NOTE — Progress Notes (Signed)
" ° °  Heart Failure Stewardship Pharmacist Progress Note   PCP: Charlott Dorn LABOR, MD PCP-Cardiologist: Gordy Bergamo, MD    HPI:  83 yo M with PMH of HTN, HLD, CHF, CAD, T2DM, PAD s/p R BKA, and CKD III.  Admitted 12/13-12/17 with right lower lobe pneumonia.   Presented to the ED on 12/21 with increased LE edema and abdominal distention. States that during the last hospitalization he was not given his lasix  which contributed to his symptoms. Reports decreased appetite. Has been taking lasix  20 mg daily at home (40 mg the day of arrival to the hospital) and his symptoms did not improve. proBNP elevated. CT chest with mild cardiomegaly and minimal interstitial edema. Last ECHO 05/17/24 with LVEF 40-45%, global hypokinesis, moderate concentric LVH, G1DD, RV normal, moderate AS.  Reports no shortness of breath, chest pain, lightheadedness, or dizziness. No LE edema. Patient is hopeful for discharge today. Agreeable to using [facility] TOC pharmacy at discharge.   Current HF Medications: Diuretic: furosemide  40 mg IV BID  Prior to admission HF Medications: Diuretic: furosemide  20 mg daily SGLT2i: Jardiance  25 mg daily  Pertinent Lab Values: Serum creatinine 2.50>2.37 (baseline 1.4-1.6), BUN 21, Potassium 4.4, Sodium 134, proBNP 20065, Magnesium  1.5 (12/22), A1c 7.9  Vital Signs: Weight: 144 lbs (admission weight: estimated 160 lbs) Blood pressure: 90-100/70s  Heart rate: 80s  I/O: net -3.1L yesterday; net -5.7L since admission  Medication Assistance / Insurance Benefits Check: Does the patient have prescription insurance?  Yes Type of insurance plan: Medicare + supplement  Outpatient Pharmacy:  Prior to admission [facility] Is the patient willing to use [facility] TOC pharmacy at discharge? Yes Is the patient willing to transition their outpatient pharmacy to utilize a [facility] outpatient pharmacy?   No    Assessment: 1. Acute on chronic systolic CHF (LVEF 40-45%), due to ICM.  NYHA class II symptoms. - On furosemide  40 mg IV BID - recommend transitioning to furosemide  40 mg PO daily. Strict I/Os and daily weights. Keep K>4 and Mg>2.  - Further GDMT pending further improvement in renal function - Consider adding BB once euvolemic   Plan: 1) Medication changes recommended at this time: - Furosemide  40 mg daily at discharge - Resume Jardiance  at discharge - GDMT limited by AKI on CKD  2) Patient assistance: - None pending  3)  Education  - Patient has been educated on current HF medications and potential additions to HF medication regimen - Patient verbalizes understanding that over the next few months, these medication doses may change and more medications may be added to optimize HF regimen - Patient has been educated on basic disease state pathophysiology and goals of therapy   Duwaine Plant, PharmD, BCPS Heart Failure Stewardship Pharmacist Phone [phone]   "

## 2024-10-05 ENCOUNTER — Emergency Department (HOSPITAL_BASED_OUTPATIENT_CLINIC_OR_DEPARTMENT_OTHER)

## 2024-10-05 ENCOUNTER — Encounter (HOSPITAL_BASED_OUTPATIENT_CLINIC_OR_DEPARTMENT_OTHER): Payer: Self-pay | Admitting: Emergency Medicine

## 2024-10-05 ENCOUNTER — Other Ambulatory Visit: Payer: Self-pay

## 2024-10-05 ENCOUNTER — Emergency Department (HOSPITAL_BASED_OUTPATIENT_CLINIC_OR_DEPARTMENT_OTHER)
Admission: EM | Admit: 2024-10-05 | Discharge: 2024-10-06 | Disposition: A | Discharge status: Home / Self Care | Attending: Emergency Medicine | Admitting: Emergency Medicine

## 2024-10-05 DIAGNOSIS — R1013 Epigastric pain: Secondary | ICD-10-CM

## 2024-10-05 DIAGNOSIS — I2510 Atherosclerotic heart disease of native coronary artery without angina pectoris: Secondary | ICD-10-CM | POA: Diagnosis not present

## 2024-10-05 DIAGNOSIS — Z7984 Long term (current) use of oral hypoglycemic drugs: Secondary | ICD-10-CM | POA: Insufficient documentation

## 2024-10-05 DIAGNOSIS — Z96641 Presence of right artificial hip joint: Secondary | ICD-10-CM | POA: Insufficient documentation

## 2024-10-05 DIAGNOSIS — Z85828 Personal history of other malignant neoplasm of skin: Secondary | ICD-10-CM | POA: Insufficient documentation

## 2024-10-05 DIAGNOSIS — R630 Anorexia: Secondary | ICD-10-CM | POA: Diagnosis present

## 2024-10-05 DIAGNOSIS — I10 Essential (primary) hypertension: Secondary | ICD-10-CM | POA: Diagnosis present

## 2024-10-05 DIAGNOSIS — Z96652 Presence of left artificial knee joint: Secondary | ICD-10-CM | POA: Insufficient documentation

## 2024-10-05 DIAGNOSIS — Z7902 Long term (current) use of antithrombotics/antiplatelets: Secondary | ICD-10-CM | POA: Insufficient documentation

## 2024-10-05 DIAGNOSIS — Z79899 Other long term (current) drug therapy: Secondary | ICD-10-CM | POA: Insufficient documentation

## 2024-10-05 DIAGNOSIS — I110 Hypertensive heart disease with heart failure: Secondary | ICD-10-CM | POA: Diagnosis not present

## 2024-10-05 DIAGNOSIS — I509 Heart failure, unspecified: Secondary | ICD-10-CM | POA: Diagnosis not present

## 2024-10-05 DIAGNOSIS — Z8616 Personal history of COVID-19: Secondary | ICD-10-CM | POA: Insufficient documentation

## 2024-10-05 DIAGNOSIS — I5022 Chronic systolic (congestive) heart failure: Secondary | ICD-10-CM | POA: Diagnosis present

## 2024-10-05 DIAGNOSIS — Z794 Long term (current) use of insulin: Secondary | ICD-10-CM | POA: Diagnosis not present

## 2024-10-05 DIAGNOSIS — R109 Unspecified abdominal pain: Secondary | ICD-10-CM | POA: Diagnosis present

## 2024-10-05 DIAGNOSIS — K859 Acute pancreatitis without necrosis or infection, unspecified: Secondary | ICD-10-CM | POA: Diagnosis present

## 2024-10-05 DIAGNOSIS — E1140 Type 2 diabetes mellitus with diabetic neuropathy, unspecified: Secondary | ICD-10-CM | POA: Insufficient documentation

## 2024-10-05 DIAGNOSIS — D696 Thrombocytopenia, unspecified: Secondary | ICD-10-CM | POA: Diagnosis present

## 2024-10-05 DIAGNOSIS — N179 Acute kidney failure, unspecified: Secondary | ICD-10-CM | POA: Diagnosis not present

## 2024-10-05 DIAGNOSIS — N1831 Chronic kidney disease, stage 3a: Secondary | ICD-10-CM | POA: Diagnosis present

## 2024-10-05 DIAGNOSIS — E1165 Type 2 diabetes mellitus with hyperglycemia: Secondary | ICD-10-CM

## 2024-10-05 DIAGNOSIS — R748 Abnormal levels of other serum enzymes: Secondary | ICD-10-CM | POA: Diagnosis present

## 2024-10-05 DIAGNOSIS — R1010 Upper abdominal pain, unspecified: Secondary | ICD-10-CM | POA: Diagnosis present

## 2024-10-05 DIAGNOSIS — E785 Hyperlipidemia, unspecified: Secondary | ICD-10-CM | POA: Diagnosis present

## 2024-10-05 HISTORY — DX: Dehiscence of amputation stump: T87.81

## 2024-10-05 LAB — URINALYSIS, ROUTINE W REFLEX MICROSCOPIC
Bilirubin Urine: NEGATIVE
Glucose, UA: >=1000 mg/dL — AB
Ketones, ur: NEGATIVE mg/dL
Leukocytes,Ua: NEGATIVE
Nitrite: NEGATIVE
Protein, ur: 30 mg/dL — AB
Specific Gravity, Urine: 1.015 (ref 1.005–1.030)
pH: 5.5 (ref 5.0–8.0)

## 2024-10-05 LAB — URINALYSIS, MICROSCOPIC (REFLEX)

## 2024-10-05 LAB — COMPREHENSIVE METABOLIC PANEL WITH GFR
ALT: 201 U/L — ABNORMAL HIGH (ref 0–44)
AST: 283 U/L — ABNORMAL HIGH (ref 15–41)
Albumin: 3.6 g/dL (ref 3.5–5.0)
Alkaline Phosphatase: 207 U/L — ABNORMAL HIGH (ref 38–126)
Anion gap: 19 — ABNORMAL HIGH (ref 5–15)
BUN: 55 mg/dL — ABNORMAL HIGH (ref 8–23)
CO2: 21 mmol/L — ABNORMAL LOW (ref 22–32)
Calcium: 10 mg/dL (ref 8.9–10.3)
Chloride: 96 mmol/L — ABNORMAL LOW (ref 98–111)
Creatinine, Ser: 2.9 mg/dL — ABNORMAL HIGH (ref 0.61–1.24)
GFR, Estimated: 21 mL/min — ABNORMAL LOW
Glucose, Bld: 213 mg/dL — ABNORMAL HIGH (ref 70–99)
Potassium: 3.8 mmol/L (ref 3.5–5.1)
Sodium: 136 mmol/L (ref 135–145)
Total Bilirubin: 0.8 mg/dL (ref 0.0–1.2)
Total Protein: 6.3 g/dL — ABNORMAL LOW (ref 6.5–8.1)

## 2024-10-05 LAB — CBG MONITORING, ED: Glucose-Capillary: 129 mg/dL — ABNORMAL HIGH (ref 70–99)

## 2024-10-05 LAB — CBC
HCT: 28.5 % — ABNORMAL LOW (ref 39.0–52.0)
Hemoglobin: 9.2 g/dL — ABNORMAL LOW (ref 13.0–17.0)
MCH: 31.2 pg (ref 26.0–34.0)
MCHC: 32.3 g/dL (ref 30.0–36.0)
MCV: 96.6 fL (ref 80.0–100.0)
Platelets: 56 K/uL — ABNORMAL LOW (ref 150–400)
RBC: 2.95 MIL/uL — ABNORMAL LOW (ref 4.22–5.81)
RDW: 16.1 % — ABNORMAL HIGH (ref 11.5–15.5)
WBC: 6.7 K/uL (ref 4.0–10.5)
nRBC: 0 % (ref 0.0–0.2)

## 2024-10-05 LAB — PRO BRAIN NATRIURETIC PEPTIDE: Pro Brain Natriuretic Peptide: 27900 pg/mL — ABNORMAL HIGH

## 2024-10-05 LAB — LIPASE, BLOOD: Lipase: 183 U/L — ABNORMAL HIGH (ref 11–51)

## 2024-10-05 MED ORDER — INSULIN ASPART 100 UNIT/ML IJ SOLN: 0.0000 [IU] | Freq: Every day | INTRAMUSCULAR | Status: DC

## 2024-10-05 MED ORDER — SODIUM CHLORIDE 0.9 % IV BOLUS
500.0000 mL | Freq: Once | INTRAVENOUS | Status: AC
  Administered 2024-10-05: 500 mL via INTRAVENOUS

## 2024-10-05 MED ORDER — SUCRALFATE 1 GM/10ML PO SUSP
1.0000 g | Freq: Two times a day (BID) | ORAL | Status: DC
  Administered 2024-10-06 (×2): 1 g via ORAL
  Filled 2024-10-05 (×2): qty 10

## 2024-10-05 MED ORDER — INSULIN ASPART 100 UNIT/ML IJ SOLN
0.0000 [IU] | Freq: Three times a day (TID) | INTRAMUSCULAR | Status: DC
  Administered 2024-10-06: 1 [IU] via SUBCUTANEOUS
  Filled 2024-10-05: qty 1

## 2024-10-05 MED ORDER — DICYCLOMINE HCL 10 MG PO CAPS: 10.0000 mg | ORAL_CAPSULE | Freq: Three times a day (TID) | ORAL | Status: DC | PRN

## 2024-10-05 MED ORDER — FUROSEMIDE 10 MG/ML IJ SOLN
20.0000 mg | Freq: Once | INTRAMUSCULAR | Status: AC
  Administered 2024-10-06: 20 mg via INTRAVENOUS
  Filled 2024-10-05: qty 2

## 2024-10-05 MED ORDER — EMPAGLIFLOZIN 25 MG PO TABS: 25.0000 mg | ORAL_TABLET | Freq: Every day | ORAL | Status: DC

## 2024-10-05 MED ORDER — MORPHINE SULFATE (PF) 2 MG/ML IV SOLN: 1.0000 mg | INTRAVENOUS | Status: DC | PRN

## 2024-10-05 MED ORDER — CLOPIDOGREL BISULFATE 75 MG PO TABS
75.0000 mg | ORAL_TABLET | Freq: Every day | ORAL | Status: DC
  Administered 2024-10-06: 75 mg via ORAL
  Filled 2024-10-05: qty 1

## 2024-10-05 NOTE — ED Provider Triage Note (Signed)
 Emergency Medicine Provider Triage Evaluation Note  Keshon Markovitz , a 84 y.o. male  was evaluated in triage.  Pt complains of upper abdominal pain that has been ongoing for a couple of weeks.  Reports that he feels very full after eating and eating seems to worsen his symptoms.  Also reports associated nausea and some decreased appetite.  He was recently admitted for CHF exacerbation and reports that the symptoms were present upon admission at that time and have not improved.  No fevers at home.  Review of Systems  Positive: As above Negative: As above  Physical Exam  BP (!) 106/57 (BP Location: Right Arm)   Pulse 87   Temp 98.4 F (36.9 C) (Oral)   Resp 20   SpO2 99%  Gen:   Awake, no distress   Resp:  Normal effort  MSK:   Moves extremities without difficulty  Other:  Mild epigastric tenderness to palpation without rebound or guarding  Medical Decision Making  Medically screening exam initiated at 4:12 PM.  Appropriate orders placed.  Darral Rishel was informed that the remainder of the evaluation will be completed by another provider, this initial triage assessment does not replace that evaluation, and the importance of remaining in the ED until their evaluation is complete.    Veta Palma, PA-C 10/05/24 [phone]

## 2024-10-05 NOTE — Plan of Care (Addendum)
 [facility] emergency department to [facility] telemetry bed transfer:   84 year old man history of hypertension, hyperlipidemia, HFrEF, CAD, DM type II, peripheral artery disease status post right-sided BKA, CKD stage IIIa and chronic thrombocytopenia presented to emergency department with complaining of midepigastric abdominal pain with associated nausea and decreased appetite.  Patient reported history of pancreatitis in the past status post cholecystectomy.  Denies any alcohol use.  Patient recently admitted less than 2 weeks ago for CHF exacerbation.  At presentation to ED patient found borderline hypotensive otherwise hemodynamically stable.  Afebrile. Lab, CBC unremarkable except low platelet count 56.  Platelet count at baseline CMP showing low bicarb 21, elevated creatinine 2.9, elevated AST/ALT/ALP and normal bilirubin.  Elevated anion gap 19.  Elevated lipase level 183. Elevated proBNP 27,900 which was around 20,000 during last admission 2 days ago.  Right upper quadrant ultrasound: 1. No sonographic evidence of biliary obstruction status post cholecystectomy. 2. Small volume upper abdominal free fluid. 3. Small right pleural effusion. 4. Evaluation is limited by overlying bowel gas.  Pending chest x-ray.  In the ED patient received 500 mL of NS bolus.  EDP Dr. Patsey stated that patient has right lower extremity swelling.  There is concern for again CHF exacerbation and pancreatitis.  Hospitalist consulted for further evaluation management of CHF exacerbation, transaminitis/concern for acute pancreatitis.   [facility] will assume care on arrival to accepting facility. Until arrival, care as per EDP. However, [facility] available 24/7 for questions and assistance. Check [email or web address] for on-call coverage. Nursing staff, please call [facility] Admits & Consults System-Wide number under Amion on patient's arrival so appropriate admitting provider can evaluate the pt.    Author: Zayna Toste, MD  [facility]

## 2024-10-05 NOTE — ED Triage Notes (Signed)
 Abdo pain ( generalized) Shooting pains that last 15 seconds  and nausea No app  Its been this way when asked when this started Recent hospitalization  I feel terrible

## 2024-10-05 NOTE — ED Provider Notes (Signed)
 " [place] EMERGENCY DEPARTMENT AT [facility] Provider Note   CSN: [id number] Arrival date & time: 10/05/24  1523     Patient presents with: Abdominal Pain   Keith Espinoza is a 84 y.o. male.    Abdominal Pain Patient presents with abdominal pain.  Upper abdomen.  Has been feeling bad for a while now.  Recent admission in the hospital.  Decreased appetite.  Pain comes on randomly.  Sometimes with eating sometimes without.  Did have pancreatitis years ago after gallstones.  Has had a cholecystectomy however.  Does not drink alcohol.    Past Medical History:  Diagnosis Date   Allergic rhinitis    Anemia    Aortic stenosis    Arthritis    Basal cell carcinoma 11/01/2019    bcc left chest treatment TX cx3 4fu    CHF (congestive heart failure) (HCC)    Chronic leg pain    right   Chronic lower back pain    Coronary artery disease    a. Stenting to RCA 2004; staged DES to LAD and Cx 2004. DES to [facility] 2012. b. DES to mCx, PTCA to dCx 11/2011. c. Lateral wall MI 2013 s/p PTCA to distal Cx & DES to mid OM2 11/2011. d. Low risk nuc 04/2014, EF wnl.   COVID-19    Diabetes mellitus    Insulin  dependent   Diabetic neuropathy (HCC)    MILD   Diverticulosis    Dysrhythmia    Bertrum syndrome    Gout    right wrist; right foot; right elbow; have had it since 1970's   H/O hiatal hernia    Heart murmur    History of echocardiogram    aortic sclerosis per echo 12/09 EF 65%, otherwise normal   History of hemorrhoids    BLEEDING   History of kidney stones    h/o   Hypertension    Diagnosed 1995    Myocardial infarction [facility])    Pancreatic pseudocyst    a. s/p remote drainage 2006.   Thrombocytopenia    Seen on oldest labs in system from 2004   Vertigo    Vitamin B 12 deficiency    orally replaced    Prior to Admission medications  Medication Sig Start Date End Date Taking? Authorizing Provider  acetaminophen  (TYLENOL ) 500 MG tablet Take 2 tablets (1,000 mg  total) by mouth every 6 (six) hours. Patient taking differently: Take 1,000 mg by mouth daily as needed for mild pain (pain score 1-3) or moderate pain (pain score 4-6). 10/12/23   [name], MD  allopurinol  (ZYLOPRIM ) 100 MG tablet Take 0.5 tablets (50 mg total) by mouth every other day. 09/25/24 10/25/24  Sigdel, Santosh, MD  Ascorbic Acid (VITAMIN C PO) Take 1 tablet by mouth daily.    [provider]  Cholecalciferol  (VITAMIN D -3 PO) Take 1 capsule by mouth daily.    [provider]  clopidogrel  (PLAVIX ) 75 MG tablet Take 1 tablet (75 mg total) by mouth daily. 09/21/24   [name], PA-C  Continuous Glucose Sensor (DEXCOM G7 SENSOR) MISC 1 Device by Does not apply route as directed. 09/10/24   [name], [name], MD  Cyanocobalamin  (VITAMIN B-12 PO) Take 1 tablet by mouth daily.    [provider]  dicyclomine  (BENTYL ) 10 MG capsule Take 1 capsule (10 mg total) by mouth 3 (three) times daily as needed for spasms. 09/19/24 10/19/24  Regalado, Owen A, MD  empagliflozin  (JARDIANCE ) 25 MG  TABS tablet Take 25 mg by mouth daily.    [provider]  furosemide  (LASIX ) 20 MG tablet Take 1 tablet (20 mg total) by mouth daily as needed. Patient taking differently: Take 20 mg by mouth daily. 09/19/24   Regalado, Belkys A, MD  furosemide  (LASIX ) 20 MG tablet Take 1 tablet (20 mg total) by mouth daily. 09/25/24 09/25/25  Mcarthur Pick, MD  gabapentin  (NEURONTIN ) 300 MG capsule Take 1 capsule (300 mg total) by mouth at bedtime. Patient taking differently: Take 300 mg by mouth daily. 09/19/24 10/19/24  Regalado, Owen A, MD  guaiFENesin  (MUCINEX ) 600 MG 12 hr tablet Take 1 tablet (600 mg total) by mouth 2 (two) times daily. Patient not taking: Reported on 09/24/2024 09/19/24   Regalado, Belkys A, MD  insulin  aspart (NOVOLOG  FLEXPEN) 100 UNIT/ML FlexPen Max daily 30 units Patient taking differently: Inject 30 Units into the skin See admin instructions.  Inject 1 dose every morning and inject 1 dose at night as needed for hyperglycemia - per sliding scale. Max daily dose of 30 units. 06/08/24   [name], [name], MD  insulin  degludec (TRESIBA  FLEXTOUCH) 100 UNIT/ML FlexTouch Pen Inject 5 Units into the skin daily. Patient taking differently: Inject 5 Units into the skin every evening. 09/19/24   Regalado, Belkys A, MD  Insulin  Pen Needle 32G X 4 MM MISC 1 Device by Does not apply route in the morning, at noon, in the evening, and at bedtime. 06/08/24   [name], [name], MD  MAGNESIUM  PO Take 1 tablet by mouth daily.    [provider]  meclizine  (ANTIVERT ) 12.5 MG tablet Take 12.5 mg by mouth daily.    [provider]  metFORMIN  (GLUCOPHAGE ) 1000 MG tablet Take 1,000 mg by mouth 2 (two) times daily.    [provider]  nitroGLYCERIN  (NITROSTAT ) 0.4 MG SL tablet Place 1 tablet (0.4 mg total) under the tongue every 5 (five) minutes x 3 doses as needed for chest pain. 04/16/24   [name], MD  pantoprazole  (PROTONIX ) 40 MG tablet Take 1 tablet (40 mg total) by mouth 2 (two) times daily. 09/19/24   Regalado, Belkys A, MD  potassium chloride  SA (KLOR-CON  M) 20 MEQ tablet Take 20 mEq by mouth every evening.    [provider]  rosuvastatin  (CRESTOR ) 20 MG tablet Take 1 tablet (20 mg total) by mouth daily. Patient taking differently: Take 20 mg by mouth every evening. 07/27/24   [name], MD  sucralfate  (CARAFATE ) 1 GM/10ML suspension Take 10 mLs (1 g total) by mouth 2 (two) times daily. 09/19/24   Regalado, Belkys A, MD  terazosin  (HYTRIN ) 5 MG capsule Take 5 mg by mouth 2 (two) times daily.    [provider]  TURMERIC CURCUMIN PO Take 1 tablet by mouth daily.    [provider]    Allergies: Zetia [ezetimibe], Zocor [simvastatin], Entresto  [sacubitril -valsartan ], Imdur  [isosorbide  nitrate], Toprol  xl [metoprolol ], and Dilaudid  [hydromorphone  hcl]    Review of Systems   Gastrointestinal:  Positive for abdominal pain.    Updated Vital Signs BP 98/74   Pulse 80   Temp 98.5 F (36.9 C)   Resp 18   SpO2 100%   Physical Exam  (all labs ordered are listed, but only abnormal results are displayed) Labs Reviewed  LIPASE, BLOOD - Abnormal; Notable for the following components:      Result Value   Lipase 183 (*)    All other components within normal limits  COMPREHENSIVE METABOLIC PANEL  WITH GFR - Abnormal; Notable for the following components:   Chloride 96 (*)    CO2 21 (*)    Glucose, Bld 213 (*)    BUN 55 (*)    Creatinine, Ser 2.90 (*)    Total Protein 6.3 (*)    AST 283 (*)    ALT 201 (*)    Alkaline Phosphatase 207 (*)    GFR, Estimated 21 (*)    Anion gap 19 (*)    All other components within normal limits  CBC - Abnormal; Notable for the following components:   RBC 2.95 (*)    Hemoglobin 9.2 (*)    HCT 28.5 (*)    RDW 16.1 (*)    Platelets 56 (*)    All other components within normal limits  URINALYSIS, ROUTINE W REFLEX MICROSCOPIC - Abnormal; Notable for the following components:   Color, Urine STRAW (*)    Glucose, UA >=1000 (*)    Hgb urine dipstick LARGE (*)    Protein, ur 30 (*)    All other components within normal limits  PRO BRAIN NATRIURETIC PEPTIDE - Abnormal; Notable for the following components:   Pro Brain Natriuretic Peptide 27,900.0 (*)    All other components within normal limits  URINALYSIS, MICROSCOPIC (REFLEX) - Abnormal; Notable for the following components:   Bacteria, UA RARE (*)    All other components within normal limits  COMPREHENSIVE METABOLIC PANEL WITH GFR  CBC    EKG: None  Radiology: DG Chest Portable 1 View Result Date: 10/05/2024 CLINICAL DATA:  Shortness of breath EXAM: PORTABLE CHEST 1 VIEW COMPARISON:  09/17/2024, CT 09/23/2024, 06/22/2023 FINDINGS: Cardiomegaly with diffuse increased interstitial opacity and probable small pleural effusions. Aortic atherosclerosis. No pneumothorax  IMPRESSION: Cardiomegaly with diffuse increased interstitial opacity/probable interstitial edema and probable small pleural effusions. Electronically Signed   By: Luke Bun M.D.   On: 10/05/2024 23:10   US  Abdomen Limited RUQ (LIVER/GB) Result Date: 10/05/2024 EXAM: Right Upper Quadrant Abdominal Ultrasound 10/05/2024 08:30:00 PM TECHNIQUE: Real-time ultrasonography of the right upper quadrant of the abdomen was performed. COMPARISON: CT 09/23/2024. CLINICAL HISTORY: RUQ pain. Epigastric pain and nausea for 1 month. Previous cholecystectomy. FINDINGS: LIVER: Normal echogenicity. No intrahepatic biliary ductal dilatation. No evidence of mass. Hepatopetal flow in the portal vein. BILIARY SYSTEM: Surgical absence of the gallbladder. No bile duct dilatation. Common bile duct measures 2 mm. OTHER: A small amount of free fluid is demonstrated in the upper abdomen. Small right pleural effusion. Examination is somewhat limited due to overlying bowel gas. IMPRESSION: 1. No sonographic evidence of biliary obstruction status post cholecystectomy. 2. Small volume upper abdominal free fluid. 3. Small right pleural effusion. 4. Evaluation is limited by overlying bowel gas. Electronically signed by: Elsie Gravely MD 10/05/2024 09:19 PM EST RP Workstation: [id number]     Procedures   Medications Ordered in the ED  furosemide  (LASIX ) injection 20 mg (has no administration in time range)  clopidogrel  (PLAVIX ) tablet 75 mg (has no administration in time range)  dicyclomine  (BENTYL ) capsule 10 mg (has no administration in time range)  sucralfate  (CARAFATE ) 1 GM/10ML suspension 1 g (has no administration in time range)  morphine  (PF) 2 MG/ML injection 1 mg (has no administration in time range)  insulin  aspart (novoLOG ) injection 0-6 Units (has no administration in time range)  insulin  aspart (novoLOG ) injection 0-5 Units (has no administration in time range)  sodium chloride  0.9 % bolus 500 mL (0 mLs Intravenous  Stopped 10/05/24 2032)  Medical Decision Making Amount and/or Complexity of Data Reviewed Labs: ordered. Radiology: ordered.  Risk Decision regarding hospitalization.   Patient abdominal pain.  Upper abdomen.  Differential diagnoses longed but includes cause such as gastritis, biliary disease, pancreatitis.  Lipase is elevated as are LFTs now.  This is new from recent admissions.  Will get ultrasound to further evaluate.  Reviewed previous notes.  Has had previous cyst.  However recent CT scan did not show pancreatic abnormalities.  Ultrasound does not show clear cause.  However BNP is elevated also.  With worsening kidney function edema in the legs and worsening abdominal pain I think patient benefit from admission to the hospital.  Discussed with hospitalist who will admit patient.     Final diagnoses:  AKI (acute kidney injury)  Epigastric pain    ED Discharge Orders     None          Patsey Lot, MD 10/05/24 2337  "

## 2024-10-06 ENCOUNTER — Encounter (HOSPITAL_BASED_OUTPATIENT_CLINIC_OR_DEPARTMENT_OTHER): Payer: Self-pay | Admitting: Internal Medicine

## 2024-10-06 DIAGNOSIS — N179 Acute kidney failure, unspecified: Secondary | ICD-10-CM | POA: Diagnosis not present

## 2024-10-06 LAB — COMPREHENSIVE METABOLIC PANEL WITH GFR
ALT: 173 U/L — ABNORMAL HIGH (ref 0–44)
AST: 191 U/L — ABNORMAL HIGH (ref 15–41)
Albumin: 3.4 g/dL — ABNORMAL LOW (ref 3.5–5.0)
Alkaline Phosphatase: 191 U/L — ABNORMAL HIGH (ref 38–126)
Anion gap: 12 (ref 5–15)
BUN: 49 mg/dL — ABNORMAL HIGH (ref 8–23)
CO2: 27 mmol/L (ref 22–32)
Calcium: 10 mg/dL (ref 8.9–10.3)
Chloride: 98 mmol/L (ref 98–111)
Creatinine, Ser: 2.79 mg/dL — ABNORMAL HIGH (ref 0.61–1.24)
GFR, Estimated: 22 mL/min — ABNORMAL LOW
Glucose, Bld: 223 mg/dL — ABNORMAL HIGH (ref 70–99)
Potassium: 3.1 mmol/L — ABNORMAL LOW (ref 3.5–5.1)
Sodium: 137 mmol/L (ref 135–145)
Total Bilirubin: 0.8 mg/dL (ref 0.0–1.2)
Total Protein: 6.2 g/dL — ABNORMAL LOW (ref 6.5–8.1)

## 2024-10-06 LAB — CBG MONITORING, ED
Glucose-Capillary: 169 mg/dL — ABNORMAL HIGH (ref 70–99)
Glucose-Capillary: 147 mg/dL — ABNORMAL HIGH (ref 70–99)
Glucose-Capillary: 160 mg/dL — ABNORMAL HIGH (ref 70–99)

## 2024-10-06 LAB — CBC
HCT: 27.8 % — ABNORMAL LOW (ref 39.0–52.0)
Hemoglobin: 9.1 g/dL — ABNORMAL LOW (ref 13.0–17.0)
MCH: 31.1 pg (ref 26.0–34.0)
MCHC: 32.7 g/dL (ref 30.0–36.0)
MCV: 94.9 fL (ref 80.0–100.0)
Platelets: 51 K/uL — ABNORMAL LOW (ref 150–400)
RBC: 2.93 MIL/uL — ABNORMAL LOW (ref 4.22–5.81)
RDW: 15.9 % — ABNORMAL HIGH (ref 11.5–15.5)
WBC: 4.9 K/uL (ref 4.0–10.5)
nRBC: 0 % (ref 0.0–0.2)

## 2024-10-06 MED ORDER — FUROSEMIDE 20 MG PO TABS: 20.0000 mg | ORAL_TABLET | Freq: Every day | ORAL | Status: DC | PRN

## 2024-10-06 MED ORDER — FUROSEMIDE 20 MG PO TABS: 20.0000 mg | ORAL_TABLET | ORAL | Status: DC

## 2024-10-06 MED ORDER — MECLIZINE HCL 12.5 MG PO TABS: 12.5000 mg | ORAL_TABLET | Freq: Every day | ORAL | Status: AC | PRN

## 2024-10-06 MED ORDER — POTASSIUM CHLORIDE 20 MEQ PO PACK: 40.0000 meq | PACK | Freq: Once | ORAL | Status: DC

## 2024-10-06 NOTE — Discharge Instructions (Signed)
 1. Follow up with your primary care provider in 3-5 days following discharge from ER. 2. Get repeat kidney and liver function test in your PCP office next week(Monday or Tuesday Jan 5 or 6) 3. Follow up with cardiology Dr. Ladona for management of your congestive heart failure and lasix  dosing.

## 2024-10-06 NOTE — Assessment & Plan Note (Addendum)
 10/06/2024 patient is had persistent thrombocytopenia since September 09, 2024.  Daughter([name]) states that he is ready stopped his aspirin .  Given his platelet count now of 56,000, I do not think that continuing Plavix  in the short-term is advisable.  I have instructed the daughter [name] to hold further Plavix  therapy until he follows up with Dr. Ganji for repeat CBC to see if his platelet count has improved.  His hemoglobin is stable at 9.2.  I do not think he is acutely bleeding.

## 2024-10-06 NOTE — ED Notes (Signed)
 Pt's dtr,  [name], wishes to be called when pt gets a bed assignment. Her contact # [phone]

## 2024-10-06 NOTE — ED Notes (Signed)
 Called to check bed status was told by patient placement that the doctor is discharging patient

## 2024-10-06 NOTE — Assessment & Plan Note (Addendum)
 10/06/2024 patient is on metformin , NovoLog  insulin  and Tresiba .  Patient may continue to take his insulin  and metformin .

## 2024-10-06 NOTE — Assessment & Plan Note (Addendum)
 10/06/2024 he has worsening renal function I think due to continued daily diuretics.  As discussed above, we will reduce his Lasix  to 20 mg every other day.  Next dose to be on Monday, October 08, 2024.  He will need to repeat CMP in the PCP office on Monday, January 5 or Tuesday, January 6 and Dr. Candiss office.  I have also asked the daughter to stop Jardiance  for now.

## 2024-10-06 NOTE — Assessment & Plan Note (Addendum)
 10/06/2024 he has had persistently elevated LFTs since the middle of December 2025.  His right upper quadrant ultrasound shows a surgically removed gallbladder.  There is no common bile duct dilatation.  Patient and daughter both state that he is does not drink any alcohol.  His LFTs are not consistent with biliary obstruction.  He only takes 1 dose of Tylenol  a day if any.  He does remain on Crestor .  I think this may be the source of his elevated LFTs.  I have asked him to stop his Crestor .  He will need follow-up LFTs in his PCP office or cardiology.  He may be a candidate for Repatha if continued statin therapy is contraindicated due to elevated LFTs.  I have instructed the daughter to call Dr. Godfrey office on Monday to get him an appointment.

## 2024-10-06 NOTE — ED Notes (Signed)
 Called [facility] to transport the patient Keith Espinoza [id number] rm# [id number]

## 2024-10-06 NOTE — Progress Notes (Signed)
" ° °  Brief Progress Note   _____________________________________________________________________________________________________________  Patient Name: Keith Espinoza Patient DOB: 01-20-1941 Date: 10/06/2024     Data: Patient to be admitted to [facility].    Action: Patient assigned to 5M16.    Response:  Sent secure chat to [employer] that bed has been assigned but currently dirty.  ADDENDUM 1337: Update from Dr Laurence, patient being discharged from ER. Transport and bed request being cancelled.   _____________________________________________________________________________________________________________  [facility] RN Expeditor Elizabeth Haff Please contact us  directly via secure chat (search for [facility]) or by calling us  at [phone] [facility]).  "

## 2024-10-06 NOTE — Assessment & Plan Note (Addendum)
 10/06/2024 will have to hold his Crestor  for now given his elevated LFTs.

## 2024-10-06 NOTE — Assessment & Plan Note (Addendum)
 10/06/2024 unclear the cause of his abdominal pain.  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  He is on PPI.  He was able to eat without abdominal pain today.  He has had intermittent abdominal pain for several weeks to few months now.  Given his thrombocytopenia, he may some gastritis.  Patient instructed to hold his Plavix  for now until follow-up with his PCP.  Continue PPI.

## 2024-10-06 NOTE — Consult Note (Signed)
 " [facility] Consultation    Jakevious Hollister FMW:[id number] DOB: May 26, 1941 DOA: 10/05/2024  DOS: the patient was seen and examined on 10/05/2024  PCP: Charlott Dorn LABOR, MD   Requesting physician: Patsey, MD Reason for consultation: abd pain, CHF  Patient coming from: Home  I have personally briefly reviewed patient's old medical records in [facility]  CC: abd pain HPI: [name] is an 84 year old gentleman with a history of chronic systolic heart failure with an EF of 40 to 45%, recent worsening of his CKD stage 3a, type 2 diabetes, essential hypertension, thrombocytopenia, history of A-fib who is had back-to-back admissions and ER visits in December 2025.  He was seen on December 7 in the ER for abdominal pain.  He was discharged to home.  He was admitted on December 13 with right lower pneumonia and then discharged to home on December 17.  He presented back to the ER on September 23, 2024 for acute on chronic systolic heart failure.  He was discharged to home after diuresis on 09/25/2024.  He followed up with his usual primary care doctor Dr. Charlott  on September 28, 2024.  His weight in the office was 146 pounds.  Per the office note, the patient's had poor appetite.  He has had well over a months worth of intermittent abdominal pain with occasional dry heaving.   When he was discharged from the hospital on September 25, 2024, he was discharged to home with Lasix  20 mg daily and then an additional 20 mg as needed for additional weight gain.  He was continued on his Crestor .  Patient states that yesterday he was around [facility] and tried to eat a hot ham and cheese sandwich.  He took 1 bite and began having nausea.  He denies any alcohol use.  Has been taking his Lasix  as prescribed.  On arrival to the ER temp 98.4 heart rate 87 blood pressure 106/57 satting 99% on room air.  His weight was 144.84 pounds.  White count was 6.7, hemoglobin 9.2,  platelets of 56,000  Sodium 136, potassium 3.8, chloride of 21, BUN of 55, creatinine 2.9  Calcium  10.0, total protein 6.3, albumin  3.6, AST of 283, ALT of 201, alk phos of 207, total bili 0.8  Lipase of 183  [place] [address] UA showed glucose greater than thousand, large hemoglobin, negative nitrite, negative leukocyte esterase, 0-5 WBCs.  Rare bacteria.  Chest x-ray demonstrated cardiomegaly with interstitial opacities and probable small pleural effusions.  Right upper quadrant ultrasound performed demonstrated no sonographic evidence of bili obstruction status postcholecystectomy.  There is a small right pleural effusion.  Small volume of upper abdominal free fluid.  Common bile duct measures 2 mm.  Admission Labs: White count was 6.7, hemoglobin 9.2, platelets of 56,000 Sodium 136, potassium 3.8, chloride of 21, BUN of 55, creatinine 2.9 Calcium  10.0, total protein 6.3, albumin  3.6, AST of 283, ALT of 201, alk phos of 207, total bili 0.8 Lipase of 183 [place] of 720[address] UA showed glucose greater than thousand, large hemoglobin, negative nitrite, negative leukocyte esterase, 0-5 WBCs.  Rare bacteria.  Admission Imaging Studies: Chest x-ray demonstrated cardiomegaly with interstitial opacities and probable small pleural effusions. Right upper quadrant ultrasound performed demonstrated no sonographic evidence of bili obstruction status postcholecystectomy.  There is a small right pleural effusion.  Small volume of upper abdominal free fluid.  Common bile duct measures 2 mm.  Significant Labs:   Significant Imaging Studies:   Antibiotic Therapy: Anti-infectives (  From admission, onward)    None       Procedures:   Consultants:     ED Course: given IV lasix . RUQ negative for dilated CBD. Pt able to eat TV dinner without any abd pain.  Review of Systems:  Review of Systems  Constitutional:  Positive for malaise/fatigue and weight loss.  HENT: Negative.    Eyes:  Negative.   Respiratory: Negative.    Cardiovascular: Negative.   Gastrointestinal:  Positive for abdominal pain and nausea. Negative for vomiting.  Genitourinary: Negative.   Musculoskeletal:  Positive for myalgias.  Skin: Negative.   Neurological: Negative.   Endo/Heme/Allergies:  Bruises/bleeds easily.  Psychiatric/Behavioral: Negative.    All other systems reviewed and are negative.   Past Medical History:  Diagnosis Date   Allergic rhinitis    Anemia    Aortic stenosis    Arthritis    Basal cell carcinoma 11/01/2019    bcc left chest treatment TX cx3 2fu    Below-knee amputation of right lower extremity (HCC) 12/12/2020   CHF (congestive heart failure) (HCC)    Chronic leg pain    right   Chronic lower back pain    Coronary artery disease    a. Stenting to RCA 2004; staged DES to LAD and Cx 2004. DES to [facility] 2012. b. DES to mCx, PTCA to dCx 11/2011. c. Lateral wall MI 2013 s/p PTCA to distal Cx & DES to mid OM2 11/2011. d. Low risk nuc 04/2014, EF wnl.   COVID-19    Dehiscence of amputation stump (HCC)    Diabetes mellitus    Insulin  dependent   Diabetic neuropathy (HCC)    MILD   Diverticulosis    Dysrhythmia    Edema of both lower extremities 07/05/2023   Fecal impaction (HCC) 02/01/2021   Bertrum syndrome    Gout    right wrist; right foot; right elbow; have had it since 1970's   H/O hiatal hernia    Heart murmur    Hip dislocation, right (HCC) 04/13/2019   History of echocardiogram    aortic sclerosis per echo 12/09 EF 65%, otherwise normal   History of hemorrhoids    BLEEDING   History of kidney stones    h/o   Hypertension    Diagnosed 1995    Myocardial infarction [facility])    Pancreatic pseudocyst    a. s/p remote drainage 2006.   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   Status post percutaneous  transluminal coronary angioplasty 01/06/2021   Thrombocytopenia    Seen on oldest labs in system from 2004   Vertigo    Vitamin B 12 deficiency    orally replaced    Past Surgical History:  Procedure Laterality Date   ABDOMINAL AORTOGRAM W/LOWER EXTREMITY Bilateral 08/08/2020   Procedure: ABDOMINAL AORTOGRAM W/LOWER EXTREMITY;  Surgeon: Eliza Lonni RAMAN, MD;  Location: [facility] INVASIVE CV LAB;  Service: Cardiovascular;  Laterality: Bilateral;   AMPUTATION Right 12/12/2020   Procedure: RIGHT BELOW KNEE AMPUTATION;  Surgeon: Harden Jerona GAILS, MD;  Location: [facility] OR;  Service: Orthopedics;  Laterality: Right;   BACK SURGERY     total of 3 times S/P fall    CARPAL TUNNEL RELEASE Bilateral    CHOLECYSTECTOMY  1990's   CIRCUMCISION N/A 10/12/2023   Procedure: Dorsal slit;  Surgeon: [name], MD;  Location:  [facility] ORS;  Service: Urology;  Laterality: N/A;   COLONOSCOPY     COLONOSCOPY Left 07/08/2023   Procedure: COLONOSCOPY;  Surgeon: San Sandor GAILS, DO;  Location: [facility] ENDOSCOPY;  Service: Gastroenterology;  Laterality: Left;   CORONARY ANGIOPLASTY  11/11/11   CORONARY ANGIOPLASTY WITH STENT PLACEMENT  09/30/2011   1 then; makes a total of 4   CORONARY ANGIOPLASTY WITH STENT PLACEMENT  11/11/11   1; makes a total of 5   ESOPHAGOGASTRODUODENOSCOPY Left 07/08/2023   Procedure: ESOPHAGOGASTRODUODENOSCOPY (EGD);  Surgeon: San Sandor GAILS, DO;  Location: [facility] ENDOSCOPY;  Service: Gastroenterology;  Laterality: Left;   HEMOSTASIS CLIP PLACEMENT  07/08/2023   Procedure: HEMOSTASIS CLIP PLACEMENT;  Surgeon: San Sandor GAILS, DO;  Location: [facility] ENDOSCOPY;  Service: Gastroenterology;;   HOT HEMOSTASIS N/A 07/08/2023   Procedure: HOT HEMOSTASIS (ARGON PLASMA COAGULATION/BICAP);  Surgeon: San Sandor GAILS, DO;  Location: [facility] ENDOSCOPY;  Service: Gastroenterology;  Laterality: N/A;   INGUINAL HERNIA REPAIR  2003   right   JOINT REPLACEMENT Right 04/03/2002   hip replacment   KNEE ARTHROSCOPY   1990's   left   LEFT HEART CATH AND CORONARY ANGIOGRAPHY N/A 04/25/2023   Procedure: LEFT HEART CATH AND CORONARY ANGIOGRAPHY;  Surgeon: Anner Alm ORN, MD;  Location: [facility] INVASIVE CV LAB;  Service: Cardiovascular;  Laterality: N/A;   LEFT HEART CATHETERIZATION WITH CORONARY ANGIOGRAM N/A 09/30/2011   Procedure: LEFT HEART CATHETERIZATION WITH CORONARY ANGIOGRAM;  Surgeon: Candyce GORMAN Reek, MD;  Location: [facility] CATH LAB;  Service: Cardiovascular;  Laterality: N/A;  possible PCI   LEFT HEART CATHETERIZATION WITH CORONARY ANGIOGRAM N/A 11/15/2011   Procedure: LEFT HEART CATHETERIZATION WITH CORONARY ANGIOGRAM;  Surgeon: Candyce GORMAN Reek, MD;  Location: [facility] CATH LAB;  Service: Cardiovascular;  Laterality: N/A;   PERCUTANEOUS CORONARY STENT INTERVENTION (PCI-S)  09/30/2011   Procedure: PERCUTANEOUS CORONARY STENT INTERVENTION (PCI-S);  Surgeon: Candyce GORMAN Reek, MD;  Location: [facility] CATH LAB;  Service: Cardiovascular;;   PERCUTANEOUS CORONARY STENT INTERVENTION (PCI-S) N/A 11/11/2011   Procedure: PERCUTANEOUS CORONARY STENT INTERVENTION (PCI-S);  Surgeon: Candyce GORMAN Reek, MD;  Location: [facility] CATH LAB;  Service: Cardiovascular;  Laterality: N/A;   PERIPHERAL VASCULAR BALLOON ANGIOPLASTY Right 08/08/2020   Procedure: PERIPHERAL VASCULAR BALLOON ANGIOPLASTY;  Surgeon: Eliza Lonni GORMAN, MD;  Location: [facility] INVASIVE CV LAB;  Service: Cardiovascular;  Laterality: Right;  Posterior tibial    SHOULDER SURGERY Right    X 2   STUMP REVISION Right 01/09/2021   Procedure: REVISION RIGHT BELOW KNEE AMPUTATION;  Surgeon: Harden Jerona GAILS, MD;  Location: [facility] OR;  Service: Orthopedics;  Laterality: Right;   TONSILLECTOMY  ~ 1948   TOTAL HIP REVISION Right 04/13/2019   Procedure: RIGHT TOTAL HIP REVISION-POSTERIOR  APPROACH LATERAL;  Surgeon: Barbarann Oneil BROCKS, MD;  Location: [facility] OR;  Service: Orthopedics;  Laterality: Right;   TOTAL KNEE ARTHROPLASTY Left 07/23/2016   Procedure: LEFT TOTAL KNEE ARTHROPLASTY;  Surgeon:  Oneil BROCKS Barbarann, MD;  Location: [facility] OR;  Service: Orthopedics;  Laterality: Left;     reports that he has quit smoking. His smoking use included cigarettes. His smokeless tobacco use includes chew. He reports that he does not drink alcohol and does not use drugs.  Allergies[1]  Family History  Problem Relation Age of Onset   Diabetes Mother    Hyperlipidemia Mother    Hypertension Mother    Cancer Father    Hypertension Father    Diabetes Sister    Hypertension Sister    Cancer Brother  Heart attack Neg Hx     Prior to Admission medications  Medication Sig Start Date End Date Taking? Authorizing Provider  acetaminophen  (TYLENOL ) 500 MG tablet Take 2 tablets (1,000 mg total) by mouth every 6 (six) hours. Patient taking differently: Take 1,000 mg by mouth daily as needed for mild pain (pain score 1-3) or moderate pain (pain score 4-6). 10/12/23   [name], MD  allopurinol  (ZYLOPRIM ) 100 MG tablet Take 0.5 tablets (50 mg total) by mouth every other day. 09/25/24 10/25/24  Sigdel, Santosh, MD  Ascorbic Acid (VITAMIN C PO) Take 1 tablet by mouth daily.    [provider]  Cholecalciferol  (VITAMIN D -3 PO) Take 1 capsule by mouth daily.    [provider]  [Paused] clopidogrel  (PLAVIX ) 75 MG tablet Take 1 tablet (75 mg total) by mouth daily. Wait to take this until your doctor or other care provider tells you to start again. 09/21/24   [name], PA-C  Continuous Glucose Sensor (DEXCOM G7 SENSOR) MISC 1 Device by Does not apply route as directed. 09/10/24   [name], [name], MD  Cyanocobalamin  (VITAMIN B-12 PO) Take 1 tablet by mouth daily.    [provider]  dicyclomine  (BENTYL ) 10 MG capsule Take 1 capsule (10 mg total) by mouth 3 (three) times daily as needed for spasms. 09/19/24 10/19/24  Regalado, Belkys A, MD  [Paused] empagliflozin  (JARDIANCE ) 25 MG TABS tablet Take 25 mg by mouth daily. Wait to take this until your doctor or other care  provider tells you to start again.    [provider]  furosemide  (LASIX ) 20 MG tablet Take 1 tablet (20 mg total) by mouth every other day. 10/08/24 10/08/25  Laurence Locus, DO  furosemide  (LASIX ) 20 MG tablet Take 1 tablet (20 mg total) by mouth daily as needed. For weight gain >2 lbs in 1 day or 5 lbs in 1 week 10/06/24   Laurence Locus, DO  gabapentin  (NEURONTIN ) 300 MG capsule Take 1 capsule (300 mg total) by mouth at bedtime. Patient taking differently: Take 300 mg by mouth daily. 09/19/24 10/19/24  Regalado, Belkys A, MD  insulin  aspart (NOVOLOG  FLEXPEN) 100 UNIT/ML FlexPen Max daily 30 units Patient taking differently: Inject 30 Units into the skin See admin instructions. Inject 1 dose every morning and inject 1 dose at night as needed for hyperglycemia - per sliding scale. Max daily dose of 30 units. 06/08/24   [name], [name], MD  insulin  degludec (TRESIBA  FLEXTOUCH) 100 UNIT/ML FlexTouch Pen Inject 5 Units into the skin daily. Patient taking differently: Inject 5 Units into the skin every evening. 09/19/24   Regalado, Belkys A, MD  Insulin  Pen Needle 32G X 4 MM MISC 1 Device by Does not apply route in the morning, at noon, in the evening, and at bedtime. 06/08/24   [name], [name], MD  MAGNESIUM  PO Take 1 tablet by mouth daily.    [provider]  meclizine  (ANTIVERT ) 12.5 MG tablet Take 1 tablet (12.5 mg total) by mouth daily as needed for dizziness. 10/06/24   Laurence Locus, DO  metFORMIN  (GLUCOPHAGE ) 1000 MG tablet Take 1,000 mg by mouth 2 (two) times daily.    [provider]  nitroGLYCERIN  (NITROSTAT ) 0.4 MG SL tablet Place 1 tablet (0.4 mg total) under the tongue every 5 (five) minutes x 3 doses as needed for chest pain. 04/16/24   [name], MD  pantoprazole  (PROTONIX ) 40 MG tablet Take 1 tablet (40 mg total) by mouth 2 (  two) times daily. 09/19/24   Regalado, Belkys A, MD  [Paused] potassium chloride  SA (KLOR-CON  M) 20 MEQ tablet Take 20 mEq by mouth every  evening. Wait to take this until your doctor or other care provider tells you to start again.    [provider]  [Paused] rosuvastatin  (CRESTOR ) 20 MG tablet Take 1 tablet (20 mg total) by mouth daily. Patient taking differently: Take 20 mg by mouth every evening. Wait to take this until your doctor or other care provider tells you to start again. 07/27/24   [name], MD  sucralfate  (CARAFATE ) 1 GM/10ML suspension Take 10 mLs (1 g total) by mouth 2 (two) times daily. 09/19/24   Regalado, Belkys A, MD  terazosin  (HYTRIN ) 5 MG capsule Take 5 mg by mouth 2 (two) times daily.    [provider]  TURMERIC CURCUMIN PO Take 1 tablet by mouth daily.    [provider]    Physical Exam: Vitals:   10/06/24 1115 10/06/24 1130 10/06/24 1145 10/06/24 1200  BP:  106/62  94/69  Pulse: 70 60 75 70  Resp: 13 17 15 18   Temp:      TempSrc:      SpO2: 97% 95% 97% 98%  Weight:      Height:        Physical Exam Vitals and nursing note reviewed.  Constitutional:      General: He is not in acute distress.    Appearance: He is not toxic-appearing.     Comments: Chronically ill-appearing  HENT:     Head: Normocephalic and atraumatic.     Nose: Nose normal.  Eyes:     General: No scleral icterus. Cardiovascular:     Rate and Rhythm: Normal rate and regular rhythm.  Pulmonary:     Effort: Pulmonary effort is normal. No respiratory distress.     Breath sounds: Normal breath sounds. No wheezing.  Abdominal:     General: Bowel sounds are normal. There is no distension.     Palpations: Abdomen is soft.     Tenderness: There is no abdominal tenderness.  Musculoskeletal:     Left lower leg: Edema present.     Comments: Right below the knee amputation.  Trace left ankle edema.  Skin:    General: Skin is warm and dry.     Capillary Refill: Capillary refill takes less than 2 seconds.  Neurological:     General: No focal deficit present.     Mental Status: He is alert and  oriented to person, place, and time.     Labs on Admission: I have personally reviewed following labs and imaging studies  CBC: Recent Labs  Lab 10/05/24 1554 10/06/24 0512  WBC 6.7 4.9  HGB 9.2* 9.1*  HCT 28.5* 27.8*  MCV 96.6 94.9  PLT 56* 51*   Basic Metabolic Panel: Recent Labs  Lab 10/05/24 1554 10/06/24 0512  NA 136 137  K 3.8 3.1*  CL 96* 98  CO2 21* 27  GLUCOSE 213* 223*  BUN 55* 49*  CREATININE 2.90* 2.79*  CALCIUM  10.0 10.0   GFR: Estimated Creatinine Clearance: 18.3 mL/min (A) (by C-G formula based on SCr of 2.79 mg/dL (H)). Liver Function Tests: Recent Labs  Lab 10/05/24 1554 10/06/24 0512  AST 283* 191*  ALT 201* 173*  ALKPHOS 207* 191*  BILITOT 0.8 0.8  PROT 6.3* 6.2*  ALBUMIN  3.6 3.4*   Recent Labs  Lab 10/05/24 1554  LIPASE 183*   ProBNP, BNP (last  5 results) Recent Labs    05/14/24 1416 09/23/24 1117 10/05/24 1620  PROBNP 1,468.0* 20,065.0* 27,900.0*   CBG: Recent Labs  Lab 10/05/24 2348 10/06/24 0809 10/06/24 1159 10/06/24 1328  GLUCAP 129* 169* 147* 160*   Urine analysis:    Component Value Date/Time   COLORURINE STRAW (A) 10/05/2024 1620   APPEARANCEUR CLEAR 10/05/2024 1620   LABSPEC 1.015 10/05/2024 1620   PHURINE 5.5 10/05/2024 1620   GLUCOSEU >=1000 (A) 10/05/2024 1620   HGBUR LARGE (A) 10/05/2024 1620   BILIRUBINUR NEGATIVE 10/05/2024 1620   KETONESUR NEGATIVE 10/05/2024 1620   PROTEINUR 30 (A) 10/05/2024 1620   NITRITE NEGATIVE 10/05/2024 1620   LEUKOCYTESUR NEGATIVE 10/05/2024 1620    Radiological Exams on Admission: I have personally reviewed images DG Chest Portable 1 View Result Date: 10/05/2024 CLINICAL DATA:  Shortness of breath EXAM: PORTABLE CHEST 1 VIEW COMPARISON:  09/17/2024, CT 09/23/2024, 06/22/2023 FINDINGS: Cardiomegaly with diffuse increased interstitial opacity and probable small pleural effusions. Aortic atherosclerosis. No pneumothorax IMPRESSION: Cardiomegaly with diffuse increased  interstitial opacity/probable interstitial edema and probable small pleural effusions. Electronically Signed   By: Luke Bun M.D.   On: 10/05/2024 23:10   US  Abdomen Limited RUQ (LIVER/GB) Result Date: 10/05/2024 EXAM: Right Upper Quadrant Abdominal Ultrasound 10/05/2024 08:30:00 PM TECHNIQUE: Real-time ultrasonography of the right upper quadrant of the abdomen was performed. COMPARISON: CT 09/23/2024. CLINICAL HISTORY: RUQ pain. Epigastric pain and nausea for 1 month. Previous cholecystectomy. FINDINGS: LIVER: Normal echogenicity. No intrahepatic biliary ductal dilatation. No evidence of mass. Hepatopetal flow in the portal vein. BILIARY SYSTEM: Surgical absence of the gallbladder. No bile duct dilatation. Common bile duct measures 2 mm. OTHER: A small amount of free fluid is demonstrated in the upper abdomen. Small right pleural effusion. Examination is somewhat limited due to overlying bowel gas. IMPRESSION: 1. No sonographic evidence of biliary obstruction status post cholecystectomy. 2. Small volume upper abdominal free fluid. 3. Small right pleural effusion. 4. Evaluation is limited by overlying bowel gas. Electronically signed by: Elsie Gravely MD 10/05/2024 09:19 PM EST RP Workstation: [id number]    EKG: My personal interpretation of EKG shows: NSR      Assessment/Plan Principal Problem:   Acute pancreatitis Active Problems:   Loss of appetite   Thrombocytopenia   Abdominal pain   Elevated liver enzymes   Essential hypertension   Hyperlipidemia   Type 2 diabetes mellitus with hyperglycemia, with long-term current use of insulin  (HCC)   Chronic heart failure with mildly reduced ejection fraction (HFmrEF, 41-49%) (HCC)   CKD stage 3a, GFR 45-59 ml/min (HCC)    Assessment and Plan: * Acute pancreatitis 10/06/2024 patient had a lipase of 184 on admission to the ER.  Patient has no abdominal discomfort today.  He was able to eat a TV dinner in the ER without any abdominal pain.  I  do not think he has acute pancreatitis.  After long discussion with the patient's daughter [name], I do not think the patient needs acute hospital admission.  I think he does have elevated LFTs, worsening renal function which can be explained by continued medications that need to be paired back or discontinued altogether.  I think his Crestor  needs to be placed on hold until his LFTs have returned to normal.  He may need to be on alternative agents such as Repatha.  In regards to his worsening renal failure, I think daily diuretic therapy is making his renal failure worse.  Lasix  will be changed to every other day 20  mg.  He will need to follow-up with his PCP and cardiology for repeat labs and further management of his chronic systolic heart failure.  Patient may benefit from seeing nephrology as his PCP has described however I think getting his diuretic dose management stabilized may improve his renal function overall.  I do not think the patient needs to be admitted.  Discussed this with the daughter [name].  Patient be discharged in the ER.     Loss of appetite 10/06/2024 patient was able to tolerate a TV dinner here in the ER.  Unclear the cause of his poor appetite.  He may be a candidate for low-dose Remeron to help stimulate his appetite.  His daughter wanted to hold off on any further medications.  I think that is very reasonable.   Elevated liver enzymes 10/06/2024 he has had persistently elevated LFTs since the middle of December 2025.  His right upper quadrant ultrasound shows a surgically removed gallbladder.  There is no common bile duct dilatation.  Patient and daughter both state that he is does not drink any alcohol.  His LFTs are not consistent with biliary obstruction.  He only takes 1 dose of Tylenol  a day if any.  He does remain on Crestor .  I think this may be the source of his elevated LFTs.  I have asked him to stop his Crestor .  He will need follow-up LFTs in his PCP office or cardiology.   He may be a candidate for Repatha if continued statin therapy is contraindicated due to elevated LFTs.  I have instructed the daughter to call Dr. Godfrey office on Monday to get him an appointment.   Abdominal pain 10/06/2024 unclear the cause of his abdominal pain.  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  He is on PPI.  He was able to eat without abdominal pain today.  He has had intermittent abdominal pain for several weeks to few months now.  Given his thrombocytopenia, he may some gastritis.  Patient instructed to hold his Plavix  for now until follow-up with his PCP.  Continue PPI.   Thrombocytopenia 10/06/2024 patient is had persistent thrombocytopenia since September 09, 2024.  Daughter([name]) states that he is ready stopped his aspirin .  Given his platelet count now of 56,000, I do not think that continuing Plavix  in the short-term is advisable.  I have instructed the daughter [name] to hold further Plavix  therapy until he follows up with Dr. Ganji for repeat CBC to see if his platelet count has improved.  His hemoglobin is stable at 9.2.  I do not think he is acutely bleeding.   CKD stage 3a, GFR 45-59 ml/min (HCC) 10/06/2024 he has worsening renal function I think due to continued daily diuretics.  As discussed above, we will reduce his Lasix  to 20 mg every other day.  Next dose to be on Monday, October 08, 2024.  He will need to repeat CMP in the PCP office on Monday, January 5 or Tuesday, January 6 and Dr. Candiss office.  I have also asked the daughter to stop Jardiance  for now.   Chronic heart failure with mildly reduced ejection fraction (HFmrEF, 41-49%) (HCC) 10/06/2024 patient's weight in Dr. Candiss office on September 27, 2024 was listed as 146 pounds.  He is 144.8 pounds.  His proBNP is rising however he has no signs of volume overload.  He gets hypotensive at times.  His kidney function is gotten worse since being placed back on daily Lasix .  He did get a  dose of IV Lasix  yesterday.  I think he  needs close follow-up with Dr. Ladona with cardiology.  I have also referred him to the CHF clinic.  Daughter is going to call Dr. Dempsey office on Monday for an appointment.  For the time being, given that he has no peripheral edema, his room air saturations are 95% or better, and his renal function is getting worse, I am decreasing his Lasix  to 20 mg every other day.  He did receive a dose of IV Lasix  yesterday.  He is to hold his Lasix  until Monday, January 5.  Hopefully he can get labs in the PCP office on Monday or Tuesday to monitor his renal function.   I have also asked the daughter to stop Jardiance  for now.   Type 2 diabetes mellitus with hyperglycemia, with long-term current use of insulin  (HCC) 10/06/2024 patient is on metformin , NovoLog  insulin  and Tresiba .  Patient may continue to take his insulin  and metformin .   Hyperlipidemia 10/06/2024 will have to hold his Crestor  for now given his elevated LFTs.   Essential hypertension 10/06/2024 blood pressures have remained low.  He he is on Hytrin  for his prostate.  His low blood pressures have limited his CHF management.    Family Communication: discussed with pt's dtr [name] at bedside. Pt will be discharged to home    Camellia Door, DO [facility] 10/06/2024, 2:03 PM       [1]  Allergies Allergen Reactions   Zetia [Ezetimibe] Other (See Comments)    Myalgias    Zocor [Simvastatin] Other (See Comments)    Myalgias     Entresto  [Sacubitril -Valsartan ] Other (See Comments)    Discontinued by MD due to persistent hypotension    Imdur  [Isosorbide  Nitrate] Other (See Comments)    Discontinued by MD due to persistent hypotension with isosorbide  ER 60mg .   Toprol  Xl [Metoprolol ] Other (See Comments)    Discontinued by MD due to persistent hypotension at 12.5mg     Dilaudid  [Hydromorphone  Hcl] Other (See Comments)    Hallucinations    "

## 2024-10-06 NOTE — Assessment & Plan Note (Addendum)
 10/06/2024 patient had a lipase of 184 on admission to the ER.  Patient has no abdominal discomfort today.  He was able to eat a TV dinner in the ER without any abdominal pain.  I do not think he has acute pancreatitis.  After long discussion with the patient's daughter [name], I do not think the patient needs acute hospital admission.  I think he does have elevated LFTs, worsening renal function which can be explained by continued medications that need to be paired back or discontinued altogether.  I think his Crestor  needs to be placed on hold until his LFTs have returned to normal.  He may need to be on alternative agents such as Repatha.  In regards to his worsening renal failure, I think daily diuretic therapy is making his renal failure worse.  Lasix  will be changed to every other day 20 mg.  He will need to follow-up with his PCP and cardiology for repeat labs and further management of his chronic systolic heart failure.  Patient may benefit from seeing nephrology as his PCP has described however I think getting his diuretic dose management stabilized may improve his renal function overall.  I do not think the patient needs to be admitted.  Discussed this with the daughter [name].  Patient be discharged in the ER.

## 2024-10-06 NOTE — ED Notes (Signed)
 Waiting for pharmacy to verify meds. Message sent

## 2024-10-06 NOTE — Assessment & Plan Note (Addendum)
 10/06/2024 patient's weight in Dr. Candiss office on September 27, 2024 was listed as 146 pounds.  He is 144.8 pounds.  His proBNP is rising however he has no signs of volume overload.  He gets hypotensive at times.  His kidney function is gotten worse since being placed back on daily Lasix .  He did get a dose of IV Lasix  yesterday.  I think he needs close follow-up with Dr. Ladona with cardiology.  I have also referred him to the CHF clinic.  Daughter is going to call Dr. Dempsey office on Monday for an appointment.  For the time being, given that he has no peripheral edema, his room air saturations are 95% or better, and his renal function is getting worse, I am decreasing his Lasix  to 20 mg every other day.  He did receive a dose of IV Lasix  yesterday.  He is to hold his Lasix  until Monday, January 5.  Hopefully he can get labs in the PCP office on Monday or Tuesday to monitor his renal function.   I have also asked the daughter to stop Jardiance  for now.

## 2024-10-06 NOTE — Assessment & Plan Note (Addendum)
 10/06/2024 blood pressures have remained low.  He he is on Hytrin  for his prostate.  His low blood pressures have limited his CHF management.

## 2024-10-06 NOTE — Assessment & Plan Note (Signed)
 10/06/2024 patient was able to tolerate a TV dinner here in the ER.  Unclear the cause of his poor appetite.  He may be a candidate for low-dose Remeron to help stimulate his appetite.  His daughter wanted to hold off on any further medications.  I think that is very reasonable.

## 2024-10-06 NOTE — Hospital Course (Addendum)
 CC: abd pain HPI: Keith Espinoza is an 84 year old gentleman with a history of chronic systolic heart failure with an EF of 40 to 45%, recent worsening of his CKD stage 3a, type 2 diabetes, essential hypertension, thrombocytopenia, history of A-fib who is had back-to-back admissions and ER visits in December 2025.  He was seen on December 7 in the ER for abdominal pain.  He was discharged to home.  He was admitted on December 13 with right lower pneumonia and then discharged to home on December 17.  He presented back to the ER on September 23, 2024 for acute on chronic systolic heart failure.  He was discharged to home after diuresis on 09/25/2024.  He followed up with his usual primary care doctor Dr. Charlott  on September 28, 2024.  His weight in the office was 146 pounds.  Per the office note, the patient's had poor appetite.  He has had well over a months worth of intermittent abdominal pain with occasional dry heaving.   When he was discharged from the hospital on September 25, 2024, he was discharged to home with Lasix  20 mg daily and then an additional 20 mg as needed for additional weight gain.  He was continued on his Crestor .  Patient states that yesterday he was around [facility] and tried to eat a hot ham and cheese sandwich.  He took 1 bite and began having nausea.  He denies any alcohol use.  Has been taking his Lasix  as prescribed.  On arrival to the ER temp 98.4 heart rate 87 blood pressure 106/57 satting 99% on room air.  His weight was 144.84 pounds.  White count was 6.7, hemoglobin 9.2, platelets of 56,000  Sodium 136, potassium 3.8, chloride of 21, BUN of 55, creatinine 2.9  Calcium  10.0, total protein 6.3, albumin  3.6, AST of 283, ALT of 201, alk phos of 207, total bili 0.8  Lipase of 183  [place]NP of 720[address] UA showed glucose greater than thousand, large hemoglobin, negative nitrite, negative leukocyte esterase, 0-5 WBCs.  Rare bacteria.  Chest x-ray demonstrated  cardiomegaly with interstitial opacities and probable small pleural effusions.  Right upper quadrant ultrasound performed demonstrated no sonographic evidence of bili obstruction status postcholecystectomy.  There is a small right pleural effusion.  Small volume of upper abdominal free fluid.  Common bile duct measures 2 mm.  Admission Labs: White count was 6.7, hemoglobin 9.2, platelets of 56,000 Sodium 136, potassium 3.8, chloride of 21, BUN of 55, creatinine 2.9 Calcium  10.0, total protein 6.3, albumin  3.6, AST of 283, ALT of 201, alk phos of 207, total bili 0.8 Lipase of 183 [place]NP of 72099 UA showed glucose greater than thousand, large hemoglobin, negative nitrite, negative leukocyte esterase, 0-5 WBCs.  Rare bacteria.  Admission Imaging Studies: Chest x-ray demonstrated cardiomegaly with interstitial opacities and probable small pleural effusions. Right upper quadrant ultrasound performed demonstrated no sonographic evidence of bili obstruction status postcholecystectomy.  There is a small right pleural effusion.  Small volume of upper abdominal free fluid.  Common bile duct measures 2 mm.  Significant Labs:   Significant Imaging Studies:   Antibiotic Therapy: Anti-infectives (From admission, onward)    None       Procedures:   Consultants:

## 2024-10-11 ENCOUNTER — Telehealth (HOSPITAL_COMMUNITY): Payer: Self-pay

## 2024-10-11 ENCOUNTER — Telehealth (HOSPITAL_COMMUNITY): Payer: Self-pay | Admitting: *Deleted

## 2024-10-11 NOTE — Telephone Encounter (Signed)
 Called to confirm/remind patient of their appointment at the [facility] on   10/12/24:     Appointment:              [] Confirmed             [x] Left mess              [] No answer/No voice mail             [] Phone not in service   Patient reminded to bring all medications and/or complete list.   Confirmed patient has transportation. Gave directions, instructed to utilize valet parking.

## 2024-10-11 NOTE — Telephone Encounter (Signed)
 Called to confirm/remind patient of their appointment at the [facility] on 10/12/18.   Appointment:   [] Confirmed  [] Left mess   [] No answer/No voice mail  [] VM Full/unable to leave message  [] Phone not in service  Patient reminded to bring all medications and/or complete list.  Confirmed patient has transportation. Gave directions, instructed to utilize valet parking.

## 2024-10-12 ENCOUNTER — Encounter (HOSPITAL_COMMUNITY): Payer: Self-pay

## 2024-10-12 ENCOUNTER — Ambulatory Visit (HOSPITAL_COMMUNITY): Admit: 2024-10-12 | Discharge: 2024-10-12 | Disposition: A | Attending: Adult Health | Admitting: Adult Health

## 2024-10-12 VITALS — BP 94/64 | HR 85 | Ht 69.0 in | Wt 146.0 lb

## 2024-10-12 DIAGNOSIS — Z955 Presence of coronary angioplasty implant and graft: Secondary | ICD-10-CM | POA: Diagnosis not present

## 2024-10-12 DIAGNOSIS — N184 Chronic kidney disease, stage 4 (severe): Secondary | ICD-10-CM | POA: Diagnosis not present

## 2024-10-12 DIAGNOSIS — Z7984 Long term (current) use of oral hypoglycemic drugs: Secondary | ICD-10-CM | POA: Insufficient documentation

## 2024-10-12 DIAGNOSIS — I130 Hypertensive heart and chronic kidney disease with heart failure and stage 1 through stage 4 chronic kidney disease, or unspecified chronic kidney disease: Secondary | ICD-10-CM | POA: Insufficient documentation

## 2024-10-12 DIAGNOSIS — Z66 Do not resuscitate: Secondary | ICD-10-CM | POA: Insufficient documentation

## 2024-10-12 DIAGNOSIS — Z79899 Other long term (current) drug therapy: Secondary | ICD-10-CM | POA: Diagnosis not present

## 2024-10-12 DIAGNOSIS — E8770 Fluid overload, unspecified: Secondary | ICD-10-CM | POA: Diagnosis not present

## 2024-10-12 DIAGNOSIS — R7989 Other specified abnormal findings of blood chemistry: Secondary | ICD-10-CM | POA: Diagnosis not present

## 2024-10-12 DIAGNOSIS — E1122 Type 2 diabetes mellitus with diabetic chronic kidney disease: Secondary | ICD-10-CM | POA: Diagnosis not present

## 2024-10-12 DIAGNOSIS — F17220 Nicotine dependence, chewing tobacco, uncomplicated: Secondary | ICD-10-CM | POA: Diagnosis not present

## 2024-10-12 DIAGNOSIS — I5022 Chronic systolic (congestive) heart failure: Secondary | ICD-10-CM | POA: Diagnosis not present

## 2024-10-12 DIAGNOSIS — Z794 Long term (current) use of insulin: Secondary | ICD-10-CM | POA: Diagnosis not present

## 2024-10-12 DIAGNOSIS — I2510 Atherosclerotic heart disease of native coronary artery without angina pectoris: Secondary | ICD-10-CM | POA: Diagnosis not present

## 2024-10-12 DIAGNOSIS — E785 Hyperlipidemia, unspecified: Secondary | ICD-10-CM | POA: Diagnosis not present

## 2024-10-12 MED ORDER — FUROSEMIDE 20 MG PO TABS: 20.0000 mg | ORAL_TABLET | Freq: Every day | ORAL | 1 refills | Status: AC

## 2024-10-12 NOTE — Progress Notes (Signed)
 "    HEART & VASCULAR TRANSITION OF CARE CONSULT NOTE     Referring Physician:Dr Sigdel Charlott Dorn LABOR, MD   Chief Complaint: Heart Failure   HPI: Referred to clinic by Dr Mcarthur for heart failure consultation.   Keith Espinoza is a 84 y.o. male with a history of HFmEF, HTN, HLD, CAD, and DMII, PAD R BKA, and CKD Stage IV. Severe multivessel CAD. Had stents RCA, LAD, LCX. Most recent cath 2024. Diffuse CAD. Turned down for CABG. Of note intolerant ranexa  due to nausea.   He saw Dr Margaretann 05/2024, at that time he was DNR. Most recent hospitalization code status changed to full code.   Admitted 09/15/24 with CAP. Hospital course complicated by shock/AKI. Given IV fluids. Discharged 09/19/24.   Admitted 09/23/24 with A/C HFmEF. Diuresed with IV lasix  and transitioned to lasix  20 mg po daily.   Returned to the ED 10/05/24 with abdominal pain. Imaging unrevealing. BNP elevated. Given dose of IV lasix  and discharged to home on lasix  20 mg every other day.   He is here today with his daughter. Complains of abd bloating when he doesn't take lasix . Unable to eat when he he has abdominal bloating.  Complaining of fatigue. Denies SOB/PND/Orthopnea. Appetite poor. He has had difficult swallowing but refuses swallow evaluation. No fever or chills. Taking all medications. His grandauther lives with him and his daughter lives behind him.   Cardiac Testing  Echo LVEF 40-45% RV mildly reduced.   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 Stenting to RCA 2004; staged DES to LAD and Cx 2004. DES to [facility] 2012. b. DES to mCx, PTCA to dCx 11/2011. c. Lateral wall MI  2013 s/p PTCA to distal Cx & DES to mid OM2 11/2011. d. Low risk nuc 04/2014, EF wnl.   COVID-19    Dehiscence of amputation stump (HCC)    Diabetes mellitus    Insulin  dependent   Diabetic neuropathy (HCC)    MILD   Diverticulosis    Dysrhythmia    Edema of both lower extremities 07/05/2023   Fecal impaction (HCC) 02/01/2021   Bertrum syndrome    Gout    right wrist; right foot; right elbow; have had it since 1970's   H/O hiatal hernia    Heart murmur    Hip dislocation, right (HCC) 04/13/2019   History of echocardiogram    aortic sclerosis per echo 12/09 EF 65%, otherwise normal   History of hemorrhoids    BLEEDING   History of kidney stones    h/o   Hypertension    Diagnosed 1995    Myocardial infarction [facility])    Pancreatic pseudocyst    a. s/p remote drainage 2006.   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 (Patient taking differently: Take 1,000 mg by mouth daily as needed for mild pain (pain score 1-3) or moderate pain (pain score 4-6).) 50 tablet 0   allopurinol  (ZYLOPRIM ) 100 MG tablet Take 0.5 tablets (50 mg total) by mouth every other day. 8 tablet 0   Ascorbic Acid (VITAMIN C PO) Take 1 tablet by mouth daily.     Cholecalciferol  (VITAMIN D -3 PO) Take 1 capsule by mouth daily.     [Paused] clopidogrel  (PLAVIX ) 75 MG tablet Take 1 tablet (75 mg total) by mouth daily. 90 tablet 3   Continuous Glucose Sensor (DEXCOM G7 SENSOR) MISC 1 Device by Does not apply  route as directed. 9 each 3   Cyanocobalamin  (VITAMIN B-12 PO) Take 1 tablet by mouth daily.     dicyclomine  (BENTYL ) 10 MG capsule Take 1 capsule (10 mg total) by mouth 3 (three) times daily as needed for spasms. 30 capsule 0   [Paused] empagliflozin  (JARDIANCE ) 25 MG TABS tablet Take 25 mg by mouth daily.     furosemide  (LASIX ) 20 MG tablet Take 1 tablet (20 mg total) by mouth every other day.     gabapentin  (NEURONTIN ) 300 MG capsule Take 1 capsule (300 mg total) by mouth at bedtime. (Patient taking differently: Take 300 mg by mouth daily.) 30 capsule 0   insulin  aspart (NOVOLOG  FLEXPEN) 100 UNIT/ML FlexPen Max daily 30 units (Patient taking differently: Inject 30 Units into the skin See admin instructions. Inject 1 dose every morning and inject 1 dose at night as needed for hyperglycemia - per sliding scale. Max daily dose of 30 units.) 30 mL 3   insulin  degludec (TRESIBA  FLEXTOUCH) 100 UNIT/ML FlexTouch Pen Inject 5 Units into the skin daily. (Patient taking differently: Inject 5 Units into the skin every evening.) 15 mL 3   Insulin  Pen Needle 32G X 4 MM MISC 1 Device by Does not apply route in the morning, at noon, in the evening, and at bedtime. 400 each 3   MAGNESIUM  PO Take 1 tablet by mouth daily.     meclizine  (ANTIVERT ) 12.5 MG tablet Take 1 tablet (12.5 mg total) by mouth daily as needed for dizziness.     metFORMIN  (GLUCOPHAGE ) 1000 MG tablet Take 1,000 mg by mouth 2 (two) times daily.     nitroGLYCERIN  (NITROSTAT ) 0.4 MG SL tablet Place 1 tablet (0.4 mg total) under the tongue every 5 (five) minutes x 3 doses as needed for chest pain. 25 tablet 6   pantoprazole  (PROTONIX ) 40 MG tablet Take 1 tablet (40 mg total) by mouth 2 (two) times daily. 30 tablet 0   [Paused] potassium chloride  SA (KLOR-CON  M) 20 MEQ tablet Take 20 mEq by mouth every evening.     [Paused] rosuvastatin  (CRESTOR ) 20 MG tablet Take 1 tablet (20 mg total) by mouth daily. (Patient taking differently: Take 20 mg by mouth  every evening.) 90 tablet 2   sucralfate  (CARAFATE ) 1 GM/10ML suspension Take 10 mLs (1 g total) by mouth 2 (two) times daily. 420 mL 0   terazosin  (HYTRIN ) 5 MG capsule Take 5 mg by mouth 2 (two) times daily.     TURMERIC CURCUMIN PO Take 1 tablet by mouth daily.     No current facility-administered medications for this  encounter.    Allergies[1]    Social History   Socioeconomic History   Marital status: Widowed    Spouse name: Not on file   Number of children: 1   Years of education: Not on file   Highest education level: High school graduate  Occupational History   Occupation: Retired  Tobacco Use   Smoking status: Former    Types: Cigarettes   Smokeless tobacco: Current    Types: Chew   Tobacco comments:    quit 60 years ago  Vaping Use   Vaping status: Never Used  Substance and Sexual Activity   Alcohol use: No   Drug use: No   Sexual activity: Not Currently  Other Topics Concern   Not on file  Social History Narrative   Not on file   Social Drivers of Health   Tobacco Use: High Risk (10/12/2024)   Patient History    Smoking Tobacco Use: Former    Smokeless Tobacco Use: Current    Passive Exposure: Not on Actuary Strain: Low Risk (09/24/2024)   Overall Financial Resource Strain (CARDIA)    Difficulty of Paying Living Expenses: Not hard at all  Food Insecurity: No Food Insecurity (09/23/2024)   Epic    Worried About Radiation Protection Practitioner of Food in the Last Year: Never true    Ran Out of Food in the Last Year: Never true  Transportation Needs: No Transportation Needs (09/24/2024)   Epic    Lack of Transportation (Medical): No    Lack of Transportation (Non-Medical): No  Physical Activity: Not on file  Stress: No Stress Concern Present (06/12/2023)   Received from [facility] Occupational [facility] - Occupational Stress Questionnaire    Feeling of Stress : Not at all  Social Connections: Socially Isolated (09/23/2024)    Social Connection and Isolation Panel    Frequency of Communication with Friends and Family: More than three times a week    Frequency of Social Gatherings with Friends and Family: More than three times a week    Attends Religious Services: Never    Database Administrator or Organizations: No    Attends Banker Meetings: Never    Marital Status: Widowed  Intimate Partner Violence: Not At Risk (09/23/2024)   Epic    Fear of Current or Ex-Partner: No    Emotionally Abused: No    Physically Abused: No    Sexually Abused: No  Depression (PHQ2-9): Low Risk (04/21/2022)   Depression (PHQ2-9)    PHQ-2 Score: 0  Alcohol Screen: Low Risk (09/24/2024)   Alcohol Screen    Last Alcohol Screening Score (AUDIT): 0  Housing: Unknown (09/24/2024)   Epic    Unable to Pay for Housing in the Last Year: No    Number of Times Moved in the Last Year: Not on file    Homeless in the Last Year: No  Utilities: Not At Risk (09/23/2024)   Epic    Threatened with loss of utilities: No  Health Literacy: Not on file      Family History  Problem Relation Age of Onset   Diabetes Mother    Hyperlipidemia Mother    Hypertension Mother    Cancer Father    Hypertension Father    Diabetes Sister    Hypertension Sister    Cancer Brother    Heart attack Neg Hx     Vitals:   10/12/24 1012  BP: 94/64  Pulse: 85  SpO2: 96%  Weight: 66.2 kg (146 lb)  Height: 5' 9 (1.753 m)   Wt Readings from Last 3 Encounters:  10/12/24 66.2 kg (146 lb)  10/06/24 65.7 kg (144 lb 13.5 oz)  09/25/24 65.7 kg (144 lb 13.5 oz)    PHYSICAL EXAM: General:  Thin/frail.  No resp difficulty Neck: no JVD.  Cor: Regular rate & rhythm. Lungs: clear Abdomen: soft, nontender, nondistended.  Extremities:LLE lower extremity above his ankle 1+  RBKA  Alert & oriented x3   ASSESSMENT & PLAN: 1. Chronic HFmEF Echo LVEF 40-45%. RV mildly reduced.  NYHA III.  GDMT  Diuretic-Mild volume overloaded. Needs to take  lasix  20 mg daily despite elevated creatinine. As note on days he does not take lasix  he feels much worse.  No room for GDMT with hypotension/CKD stage IV.   2. CAD Multiple stents placed in 2004. Diffuse CAD noted on cath 2024. Turned down for CABG.  No chest pain.   3. CKD Stage IV.  Creatinine baseline upper 2s.  He is adamant he does not want dialysis. He dose not want referral to PCP.   4. GOC Previously DNR when he was seen by Dr Margaretann. Code status changed during recent hospitalization. He tells me he wants to die in his sleep. He does not want intubation. He has follow up with PCP today. I provided GOLD DNR form and MOST Form to take to his PCP. SABRA Suspect he needs referral to Palliative Care for GOC discussion.    Referred to HFSW (PCP, Medications, Transportation, ETOH Abuse, Drug Abuse, Insurance, Financial ): No Refer to Pharmacy:  No Refer to Home Health:  No Refer to [facility]: No  Refer to General Cardiology: back to Dr Margaretann  Follow up as needed.  Meridee Branum NP-C  11:17 AM      [1]  Allergies Allergen Reactions   Zetia [Ezetimibe] Other (See Comments)    Myalgias    Zocor [Simvastatin] Other (See Comments)    Myalgias     Entresto  [Sacubitril -Valsartan ] Other (See Comments)    Discontinued by MD due to persistent hypotension    Imdur  [Isosorbide  Nitrate] Other (See Comments)    Discontinued by MD due to persistent hypotension with isosorbide  ER 60mg .   Toprol  Xl [Metoprolol ] Other (See Comments)    Discontinued by MD due to persistent hypotension at 12.5mg     Dilaudid  [Hydromorphone  Hcl] Other (See Comments)    Hallucinations    "

## 2024-10-12 NOTE — Patient Instructions (Signed)
 Medication Changes:  START LASIX  (FUROSEMIDE ) 20MG  ONCE DAILY   Follow-Up in: AS NEEDED WITH OUR CLINIC   At the [facility], you and your health needs are our priority. We have a designated team specialized in the treatment of Heart Failure. This Care Team includes your primary Heart Failure Specialized Cardiologist (physician), Advanced Practice Providers (APPs- Physician Assistants and Nurse Practitioners), and Pharmacist who all work together to provide you with the care you need, when you need it.   You may see any of the following providers on your designated Care Team at your next follow up:  Dr. Toribio Fuel Dr. Ezra Shuck Dr. Odis Brownie Greig Mosses, NP Caffie Shed, [place] [facility], [place] Beckey Coe, NP Jordan Lee, NP Tinnie Redman, PharmD   Please be sure to bring in all your medications bottles to every appointment.   Need to Contact Us :  If you have any questions or concerns before your next appointment please send us  a message through [place] or call our office at [phone].    TO LEAVE A MESSAGE FOR THE NURSE SELECT OPTION 2, PLEASE LEAVE A MESSAGE INCLUDING: YOUR NAME DATE OF BIRTH CALL BACK NUMBER REASON FOR CALL**this is important as we prioritize the call backs  YOU WILL RECEIVE A CALL BACK THE SAME DAY AS LONG AS YOU CALL BEFORE 4:00 PM

## 2024-10-22 ENCOUNTER — Emergency Department (HOSPITAL_BASED_OUTPATIENT_CLINIC_OR_DEPARTMENT_OTHER)

## 2024-10-22 ENCOUNTER — Encounter (HOSPITAL_BASED_OUTPATIENT_CLINIC_OR_DEPARTMENT_OTHER): Payer: Self-pay | Admitting: *Deleted

## 2024-10-22 ENCOUNTER — Other Ambulatory Visit: Payer: Self-pay

## 2024-10-22 ENCOUNTER — Inpatient Hospital Stay (HOSPITAL_BASED_OUTPATIENT_CLINIC_OR_DEPARTMENT_OTHER)
Admission: EM | Admit: 2024-10-22 | Discharge: 2024-10-26 | DRG: 725 | Disposition: A | Discharge status: Home Health | Attending: Family Medicine | Admitting: Family Medicine

## 2024-10-22 DIAGNOSIS — Z87891 Personal history of nicotine dependence: Secondary | ICD-10-CM

## 2024-10-22 DIAGNOSIS — Z794 Long term (current) use of insulin: Secondary | ICD-10-CM

## 2024-10-22 DIAGNOSIS — E1122 Type 2 diabetes mellitus with diabetic chronic kidney disease: Secondary | ICD-10-CM | POA: Diagnosis present

## 2024-10-22 DIAGNOSIS — D631 Anemia in chronic kidney disease: Secondary | ICD-10-CM | POA: Diagnosis present

## 2024-10-22 DIAGNOSIS — Z888 Allergy status to other drugs, medicaments and biological substances status: Secondary | ICD-10-CM

## 2024-10-22 DIAGNOSIS — I5042 Chronic combined systolic (congestive) and diastolic (congestive) heart failure: Secondary | ICD-10-CM | POA: Diagnosis present

## 2024-10-22 DIAGNOSIS — Z66 Do not resuscitate: Secondary | ICD-10-CM | POA: Diagnosis present

## 2024-10-22 DIAGNOSIS — E875 Hyperkalemia: Secondary | ICD-10-CM | POA: Diagnosis present

## 2024-10-22 DIAGNOSIS — Z7902 Long term (current) use of antithrombotics/antiplatelets: Secondary | ICD-10-CM

## 2024-10-22 DIAGNOSIS — I480 Paroxysmal atrial fibrillation: Secondary | ICD-10-CM | POA: Diagnosis present

## 2024-10-22 DIAGNOSIS — D696 Thrombocytopenia, unspecified: Secondary | ICD-10-CM | POA: Diagnosis present

## 2024-10-22 DIAGNOSIS — Z96641 Presence of right artificial hip joint: Secondary | ICD-10-CM | POA: Diagnosis present

## 2024-10-22 DIAGNOSIS — E785 Hyperlipidemia, unspecified: Secondary | ICD-10-CM | POA: Diagnosis present

## 2024-10-22 DIAGNOSIS — Z955 Presence of coronary angioplasty implant and graft: Secondary | ICD-10-CM

## 2024-10-22 DIAGNOSIS — I959 Hypotension, unspecified: Secondary | ICD-10-CM | POA: Diagnosis not present

## 2024-10-22 DIAGNOSIS — R338 Other retention of urine: Secondary | ICD-10-CM | POA: Diagnosis present

## 2024-10-22 DIAGNOSIS — Z8249 Family history of ischemic heart disease and other diseases of the circulatory system: Secondary | ICD-10-CM

## 2024-10-22 DIAGNOSIS — R109 Unspecified abdominal pain: Principal | ICD-10-CM

## 2024-10-22 DIAGNOSIS — G8929 Other chronic pain: Secondary | ICD-10-CM | POA: Diagnosis present

## 2024-10-22 DIAGNOSIS — L89153 Pressure ulcer of sacral region, stage 3: Secondary | ICD-10-CM | POA: Diagnosis present

## 2024-10-22 DIAGNOSIS — N189 Chronic kidney disease, unspecified: Secondary | ICD-10-CM

## 2024-10-22 DIAGNOSIS — I130 Hypertensive heart and chronic kidney disease with heart failure and stage 1 through stage 4 chronic kidney disease, or unspecified chronic kidney disease: Secondary | ICD-10-CM | POA: Diagnosis present

## 2024-10-22 DIAGNOSIS — N390 Urinary tract infection, site not specified: Principal | ICD-10-CM | POA: Diagnosis present

## 2024-10-22 DIAGNOSIS — I252 Old myocardial infarction: Secondary | ICD-10-CM

## 2024-10-22 DIAGNOSIS — N401 Enlarged prostate with lower urinary tract symptoms: Principal | ICD-10-CM | POA: Diagnosis present

## 2024-10-22 DIAGNOSIS — Z89511 Acquired absence of right leg below knee: Secondary | ICD-10-CM

## 2024-10-22 DIAGNOSIS — M109 Gout, unspecified: Secondary | ICD-10-CM | POA: Diagnosis present

## 2024-10-22 DIAGNOSIS — Z7984 Long term (current) use of oral hypoglycemic drugs: Secondary | ICD-10-CM

## 2024-10-22 DIAGNOSIS — Z85828 Personal history of other malignant neoplasm of skin: Secondary | ICD-10-CM

## 2024-10-22 DIAGNOSIS — I2510 Atherosclerotic heart disease of native coronary artery without angina pectoris: Secondary | ICD-10-CM | POA: Diagnosis present

## 2024-10-22 DIAGNOSIS — Z9049 Acquired absence of other specified parts of digestive tract: Secondary | ICD-10-CM

## 2024-10-22 DIAGNOSIS — E1140 Type 2 diabetes mellitus with diabetic neuropathy, unspecified: Secondary | ICD-10-CM | POA: Diagnosis present

## 2024-10-22 DIAGNOSIS — Z96652 Presence of left artificial knee joint: Secondary | ICD-10-CM | POA: Diagnosis present

## 2024-10-22 DIAGNOSIS — Z993 Dependence on wheelchair: Secondary | ICD-10-CM

## 2024-10-22 DIAGNOSIS — N184 Chronic kidney disease, stage 4 (severe): Secondary | ICD-10-CM | POA: Diagnosis present

## 2024-10-22 DIAGNOSIS — M545 Low back pain, unspecified: Secondary | ICD-10-CM | POA: Diagnosis present

## 2024-10-22 DIAGNOSIS — L8990 Pressure ulcer of unspecified site, unspecified stage: Secondary | ICD-10-CM | POA: Insufficient documentation

## 2024-10-22 DIAGNOSIS — Z79899 Other long term (current) drug therapy: Secondary | ICD-10-CM

## 2024-10-22 DIAGNOSIS — E872 Acidosis, unspecified: Secondary | ICD-10-CM | POA: Diagnosis present

## 2024-10-22 DIAGNOSIS — Z833 Family history of diabetes mellitus: Secondary | ICD-10-CM

## 2024-10-22 LAB — CBC
HCT: 33 % — ABNORMAL LOW (ref 39.0–52.0)
Hemoglobin: 10.2 g/dL — ABNORMAL LOW (ref 13.0–17.0)
MCH: 30.6 pg (ref 26.0–34.0)
MCHC: 30.9 g/dL (ref 30.0–36.0)
MCV: 99.1 fL (ref 80.0–100.0)
Platelets: 75 K/uL — ABNORMAL LOW (ref 150–400)
RBC: 3.33 MIL/uL — ABNORMAL LOW (ref 4.22–5.81)
RDW: 18.2 % — ABNORMAL HIGH (ref 11.5–15.5)
WBC: 5.6 K/uL (ref 4.0–10.5)
nRBC: 0 % (ref 0.0–0.2)

## 2024-10-22 LAB — COMPREHENSIVE METABOLIC PANEL WITH GFR
ALT: 60 U/L — ABNORMAL HIGH (ref 0–44)
AST: 72 U/L — ABNORMAL HIGH (ref 15–41)
Albumin: 3.6 g/dL (ref 3.5–5.0)
Alkaline Phosphatase: 126 U/L (ref 38–126)
Anion gap: 19 — ABNORMAL HIGH (ref 5–15)
BUN: 38 mg/dL — ABNORMAL HIGH (ref 8–23)
CO2: 15 mmol/L — ABNORMAL LOW (ref 22–32)
Calcium: 8.8 mg/dL — ABNORMAL LOW (ref 8.9–10.3)
Chloride: 102 mmol/L (ref 98–111)
Creatinine, Ser: 2.24 mg/dL — ABNORMAL HIGH (ref 0.61–1.24)
GFR, Estimated: 28 mL/min — ABNORMAL LOW
Glucose, Bld: 185 mg/dL — ABNORMAL HIGH (ref 70–99)
Potassium: 5.3 mmol/L — ABNORMAL HIGH (ref 3.5–5.1)
Sodium: 136 mmol/L (ref 135–145)
Total Bilirubin: 0.8 mg/dL (ref 0.0–1.2)
Total Protein: 5.7 g/dL — ABNORMAL LOW (ref 6.5–8.1)

## 2024-10-22 LAB — LIPASE, BLOOD: Lipase: 27 U/L (ref 11–51)

## 2024-10-22 MED ORDER — LACTATED RINGERS IV BOLUS
500.0000 mL | Freq: Once | INTRAVENOUS | Status: AC
  Administered 2024-10-22: 500 mL via INTRAVENOUS

## 2024-10-22 NOTE — ED Triage Notes (Signed)
 Pt is here for abdominal pain and distention.  Pt has not voided at all today.  Pt states that he has had abdominal pain and nausea and vomiting intermittently for over a month.  Pt denies any fever.

## 2024-10-22 NOTE — ED Notes (Signed)
 Light green tube hemolyzed.

## 2024-10-22 NOTE — ED Provider Notes (Signed)
 " [place] EMERGENCY DEPARTMENT AT [facility] Provider Note   CSN: [id number] Arrival date & time: 10/22/24  1946     Patient presents with: Abdominal Pain   Clayden Withem is a 84 y.o. male.    Abdominal Pain     Patient has a history of coronary artery disease diabetes hiatal hernia chronic back pain chronic chronic pain diverticulosis thrombocytopenia myocardial infarction, CHF.  Patient reports abdominal pain ongoing for several weeks.  Patient feels like his abdomen is bloated.  He also has not urinated much today.  No vomiting today but has been nauseated has decreased appetite.  Patient states he has been seen for this before is not sure what the diagnosis is. Prior to Admission medications  Medication Sig Start Date End Date Taking? Authorizing Provider  acetaminophen  (TYLENOL ) 500 MG tablet Take 2 tablets (1,000 mg total) by mouth every 6 (six) hours. Patient taking differently: Take 1,000 mg by mouth daily as needed for mild pain (pain score 1-3) or moderate pain (pain score 4-6). 10/12/23   [name], MD  allopurinol  (ZYLOPRIM ) 100 MG tablet Take 0.5 tablets (50 mg total) by mouth every other day. 09/25/24 10/25/24  Sigdel, Santosh, MD  Ascorbic Acid (VITAMIN C PO) Take 1 tablet by mouth daily.    [provider]  Cholecalciferol  (VITAMIN D -3 PO) Take 1 capsule by mouth daily.    [provider]  [Paused] clopidogrel  (PLAVIX ) 75 MG tablet Take 1 tablet (75 mg total) by mouth daily. Wait to take this until your doctor or other care provider tells you to start again. 09/21/24   [name], PA-C  Continuous Glucose Sensor (DEXCOM G7 SENSOR) MISC 1 Device by Does not apply route as directed. 09/10/24   [name], [name], MD  Cyanocobalamin  (VITAMIN B-12 PO) Take 1 tablet by mouth daily.    [provider]  dicyclomine  (BENTYL ) 10 MG capsule Take 1 capsule (10 mg total) by mouth 3 (three) times daily as needed for  spasms. 09/19/24 10/19/24  Regalado, Belkys A, MD  [Paused] empagliflozin  (JARDIANCE ) 25 MG TABS tablet Take 25 mg by mouth daily. Wait to take this until your doctor or other care provider tells you to start again.    [provider]  furosemide  (LASIX ) 20 MG tablet Take 1 tablet (20 mg total) by mouth daily. 10/12/24   Clegg, Amy D, NP  gabapentin  (NEURONTIN ) 300 MG capsule Take 1 capsule (300 mg total) by mouth at bedtime. Patient taking differently: Take 300 mg by mouth daily. 09/19/24 10/19/24  Regalado, Owen A, MD  insulin  aspart (NOVOLOG  FLEXPEN) 100 UNIT/ML FlexPen Max daily 30 units Patient taking differently: Inject 30 Units into the skin See admin instructions. Inject 1 dose every morning and inject 1 dose at night as needed for hyperglycemia - per sliding scale. Max daily dose of 30 units. 06/08/24   [name], [name], MD  insulin  degludec (TRESIBA  FLEXTOUCH) 100 UNIT/ML FlexTouch Pen Inject 5 Units into the skin daily. Patient taking differently: Inject 5 Units into the skin every evening. 09/19/24   Regalado, Belkys A, MD  Insulin  Pen Needle 32G X 4 MM MISC 1 Device by Does not apply route in the morning, at noon, in the evening, and at bedtime. 06/08/24   [name], [name], MD  MAGNESIUM  PO Take 1 tablet by mouth daily.    [provider]  meclizine  (ANTIVERT ) 12.5 MG tablet Take 1 tablet (12.5 mg total) by mouth daily as needed  for dizziness. 10/06/24   Laurence Locus, DO  metFORMIN  (GLUCOPHAGE ) 1000 MG tablet Take 1,000 mg by mouth 2 (two) times daily.    [provider]  nitroGLYCERIN  (NITROSTAT ) 0.4 MG SL tablet Place 1 tablet (0.4 mg total) under the tongue every 5 (five) minutes x 3 doses as needed for chest pain. 04/16/24   [name], MD  pantoprazole  (PROTONIX ) 40 MG tablet Take 1 tablet (40 mg total) by mouth 2 (two) times daily. 09/19/24   Regalado, Belkys A, MD  [Paused] potassium chloride  SA (KLOR-CON  M) 20 MEQ tablet Take 20 mEq by mouth  every evening. Wait to take this until your doctor or other care provider tells you to start again.    [provider]  [Paused] rosuvastatin  (CRESTOR ) 20 MG tablet Take 1 tablet (20 mg total) by mouth daily. Patient taking differently: Take 20 mg by mouth every evening. Wait to take this until your doctor or other care provider tells you to start again. 07/27/24   [name], MD  sucralfate  (CARAFATE ) 1 GM/10ML suspension Take 10 mLs (1 g total) by mouth 2 (two) times daily. 09/19/24   Regalado, Belkys A, MD  terazosin  (HYTRIN ) 5 MG capsule Take 5 mg by mouth 2 (two) times daily.    [provider]  TURMERIC CURCUMIN PO Take 1 tablet by mouth daily.    [provider]    Allergies: Zetia [ezetimibe], Zocor [simvastatin], Entresto  [sacubitril -valsartan ], Imdur  [isosorbide  nitrate], Toprol  xl [metoprolol ], and Dilaudid  [hydromorphone  hcl]    Review of Systems  Gastrointestinal:  Positive for abdominal pain.    Updated Vital Signs BP 103/84   Pulse 97   Temp 98 F (36.7 C)   Resp 19   SpO2 98%   Physical Exam Vitals and nursing note reviewed.  Constitutional:      Appearance: He is well-developed.     Comments: Chronically ill-appearing  HENT:     Head: Normocephalic and atraumatic.     Right Ear: External ear normal.     Left Ear: External ear normal.  Eyes:     General: No scleral icterus.       Right eye: No discharge.        Left eye: No discharge.     Conjunctiva/sclera: Conjunctivae normal.  Neck:     Trachea: No tracheal deviation.  Cardiovascular:     Rate and Rhythm: Normal rate and regular rhythm.  Pulmonary:     Effort: Pulmonary effort is normal. No respiratory distress.     Breath sounds: Normal breath sounds. No stridor. No wheezing or rales.  Abdominal:     General: Bowel sounds are normal. There is no distension.     Palpations: Abdomen is soft.     Tenderness: There is abdominal tenderness. There is no guarding or rebound.      Comments: Nondistended  Musculoskeletal:        General: No tenderness or deformity.     Cervical back: Neck supple.  Skin:    General: Skin is warm and dry.     Findings: No rash.  Neurological:     General: No focal deficit present.     Mental Status: He is alert.     Cranial Nerves: No cranial nerve deficit, dysarthria or facial asymmetry.     Sensory: No sensory deficit.     Motor: No abnormal muscle tone or seizure activity.     Coordination: Coordination normal.  Psychiatric:        Mood  and Affect: Mood normal.     (all labs ordered are listed, but only abnormal results are displayed) Labs Reviewed  CBC - Abnormal; Notable for the following components:      Result Value   RBC 3.33 (*)    Hemoglobin 10.2 (*)    HCT 33.0 (*)    RDW 18.2 (*)    Platelets 75 (*)    All other components within normal limits  COMPREHENSIVE METABOLIC PANEL WITH GFR - Abnormal; Notable for the following components:   Potassium 5.3 (*)    CO2 15 (*)    Glucose, Bld 185 (*)    BUN 38 (*)    Creatinine, Ser 2.24 (*)    Calcium  8.8 (*)    Total Protein 5.7 (*)    AST 72 (*)    ALT 60 (*)    GFR, Estimated 28 (*)    Anion gap 19 (*)    All other components within normal limits  LIPASE, BLOOD  URINALYSIS, ROUTINE W REFLEX MICROSCOPIC    EKG: None  Radiology: CT ABDOMEN PELVIS WO CONTRAST Result Date: 10/22/2024 EXAM: CT ABDOMEN AND PELVIS WITHOUT CONTRAST 10/22/2024 09:48:46 PM TECHNIQUE: CT of the abdomen and pelvis was performed without the administration of intravenous contrast. Multiplanar reformatted images are provided for review. Automated exposure control, iterative reconstruction, and/or weight-based adjustment of the mA/kV was utilized to reduce the radiation dose to as low as reasonably achievable. COMPARISON: 09/23/2024 CLINICAL HISTORY: Abdominal pain, acute, nonlocalized. FINDINGS: LOWER CHEST: Small right and trace left pleural effusions, chronic. Mild patchy right lower  lobe opacity, likely atelectasis. Severe 3 vessel coronary atherosclerosis. Hypodense blood pool relative to myocardium, suggesting anemia. LIVER: The liver is unremarkable. GALLBLADDER AND BILE DUCTS: Status post cholecystectomy. No biliary ductal dilatation. SPLEEN: No acute abnormality. PANCREAS: No acute abnormality. ADRENAL GLANDS: No acute abnormality. KIDNEYS, URETERS AND BLADDER: Multiple nonobstructing bilateral renal calculi measuring up to 5 mm, unchanged. No hydronephrosis. No perinephric or periureteral stranding. Urinary bladder is unremarkable. GI AND BOWEL: Suture line along the stomach. There is no bowel obstruction. PERITONEUM AND RETROPERITONEUM: Small to moderate abdominopelvic ascites, mildly progressive. No free air. VASCULATURE: Atherosclerotic calcifications of the abdominal aorta and branch vessels. LYMPH NODES: No lymphadenopathy. REPRODUCTIVE ORGANS: Mild prostatomegaly. BONES AND SOFT TISSUES: Right hip arthroplasty. Degenerative changes of the visualized thoracolumbar spine. No focal soft tissue abnormality. IMPRESSION: 1. No acute abdominopelvic abnormality. 2. Small to moderate abdominopelvic ascites, mildly progressive. 3. Small right and trace left pleural effusions, unchanged. 4. Hypodense blood pool relative to myocardium, which can be seen with anemia. Electronically signed by: Pinkie Pebbles MD 10/22/2024 09:52 PM EST RP Workstation: [id number]     Procedures   Medications Ordered in the ED  lactated ringers  bolus 500 mL (0 mLs Intravenous Stopped 10/22/24 2132)    Clinical Course as of 10/22/24 2340  Mon Oct 22, 2024  2132 Comprehensive metabolic panel with GFR(!) Creatinine elevated but similar to previous bicarb decreased new compared to previous potassium increased at 5.3 but there is hemolysis noted on the specimen [JK]  2132 CBC(!) Globin stable [JK]  2316 CT scan does not show any acute abnormality.  Small to moderate abdominal pelvic ascites noted [JK]   2319 CBC(!) [JK]    Clinical Course User Index [JK] Randol Simmonds, MD                                 Medical Decision  Making Problems Addressed: Abdominal pain, unspecified abdominal location: acute illness or injury Chronic kidney disease, unspecified CKD stage: chronic illness or injury  Amount and/or Complexity of Data Reviewed Labs: ordered. Decision-making details documented in ED Course. Radiology: ordered and independent interpretation performed.   Patient presented to the ED for evaluation of abdominal pain.  Patient has been having persistent symptoms ongoing for couple months now. Patient does have multiple comorbidities including chronic kidney disease congestive heart failure.  Patient's ED workup does not show any signs of acute anemia.  His creatinine is stable although his bicarb is decreasing.SABRA  Hyperkalemia noted but the specimen is hemolyzed  CT scan was performed and does not show any signs of obstruction.  No evidence of urinary retention.  Patient does have multiple comorbidities.  These could all be contributing to his increased appetite.  Recommend outpatient follow-up with his GI doctor for further evaluation.  Also recommend follow-up with his cardiologist and nephrologist.  At this time no subacute emergency medical condition requiring hospitalization.    Final diagnoses:  Abdominal pain, unspecified abdominal location  Chronic kidney disease, unspecified CKD stage    ED Discharge Orders     None          Randol Simmonds, MD 10/22/24 2341  "

## 2024-10-22 NOTE — ED Notes (Signed)
 Patient transported to CT

## 2024-10-22 NOTE — Discharge Instructions (Addendum)
 CT scan did not show any signs of acute blockage or infection..  Continue your current medications.  Follow-up with your cardiologist to be rechecked.  Follow-up with the gastroenterologist for further evaluation as we discussed.  I would also recommend following up with your kidney doctors  Social Connections Resources   Active Adults Program https://www.[place].gov/departments/parks-recreation/active-adults-50  -Adding Health to Our Years Courses  -Aquatics  -Exercise Classes: Yoga, Dance, [place], etc.  -Book Club, Games  Locations vary by activity - Main Contact # [phone]) - see calendars on website listed above.  Senior Centers -[facility] [address] [place], [place] [address] / [phone] ext [phone] -[facility] [address], [place] [address] / [phone] [facility]) [address], [place], [place] [address] / [phone] [facility] locations in [place], [place], and [place] / [phone] -[facility] [address], [place] [address] / [phone]  [facility] ([facility]) [address] [place], [place] [address] - Office #: [phone] - Enrollment Phone #: [phone]  [facility] Line  Call toll free, available 24 hours a day, [phone]   [place] [address]   2-1-1 is another useful way to locate resources in the community. Visit shedsizes.ch to find service information online. If you need additional assistance, 2-1-1 Referral Specialists are available 24 hours a day, every day by dialing 2-1-1 or [phone] from any phone. The call is free, confidential, and available in any language.

## 2024-10-23 DIAGNOSIS — R531 Weakness: Secondary | ICD-10-CM | POA: Diagnosis not present

## 2024-10-23 DIAGNOSIS — N390 Urinary tract infection, site not specified: Secondary | ICD-10-CM | POA: Diagnosis present

## 2024-10-23 LAB — BASIC METABOLIC PANEL WITH GFR
Anion gap: 16 — ABNORMAL HIGH (ref 5–15)
Anion gap: 14 (ref 5–15)
BUN: 41 mg/dL — ABNORMAL HIGH (ref 8–23)
BUN: 42 mg/dL — ABNORMAL HIGH (ref 8–23)
CO2: 20 mmol/L — ABNORMAL LOW (ref 22–32)
CO2: 20 mmol/L — ABNORMAL LOW (ref 22–32)
Calcium: 9.6 mg/dL (ref 8.9–10.3)
Calcium: 9.3 mg/dL (ref 8.9–10.3)
Chloride: 99 mmol/L (ref 98–111)
Chloride: 100 mmol/L (ref 98–111)
Creatinine, Ser: 2.44 mg/dL — ABNORMAL HIGH (ref 0.61–1.24)
Creatinine, Ser: 2.48 mg/dL — ABNORMAL HIGH (ref 0.61–1.24)
GFR, Estimated: 25 mL/min — ABNORMAL LOW
GFR, Estimated: 25 mL/min — ABNORMAL LOW
Glucose, Bld: 179 mg/dL — ABNORMAL HIGH (ref 70–99)
Glucose, Bld: 74 mg/dL (ref 70–99)
Potassium: 5.4 mmol/L — ABNORMAL HIGH (ref 3.5–5.1)
Potassium: 5.5 mmol/L — ABNORMAL HIGH (ref 3.5–5.1)
Sodium: 135 mmol/L (ref 135–145)
Sodium: 134 mmol/L — ABNORMAL LOW (ref 135–145)

## 2024-10-23 LAB — CBC
HCT: 30.8 % — ABNORMAL LOW (ref 39.0–52.0)
Hemoglobin: 9.6 g/dL — ABNORMAL LOW (ref 13.0–17.0)
MCH: 31.4 pg (ref 26.0–34.0)
MCHC: 31.2 g/dL (ref 30.0–36.0)
MCV: 100.7 fL — ABNORMAL HIGH (ref 80.0–100.0)
Platelets: 79 K/uL — ABNORMAL LOW (ref 150–400)
RBC: 3.06 MIL/uL — ABNORMAL LOW (ref 4.22–5.81)
RDW: 17.8 % — ABNORMAL HIGH (ref 11.5–15.5)
WBC: 6.4 K/uL (ref 4.0–10.5)
nRBC: 0 % (ref 0.0–0.2)

## 2024-10-23 LAB — URINALYSIS, ROUTINE W REFLEX MICROSCOPIC
Bilirubin Urine: NEGATIVE
Glucose, UA: NEGATIVE mg/dL
Ketones, ur: NEGATIVE mg/dL
Leukocytes,Ua: NEGATIVE
Nitrite: NEGATIVE
Protein, ur: 30 mg/dL — AB
Specific Gravity, Urine: 1.018 (ref 1.005–1.030)
pH: 5 (ref 5.0–8.0)

## 2024-10-23 LAB — GLUCOSE, CAPILLARY
Glucose-Capillary: 109 mg/dL — ABNORMAL HIGH (ref 70–99)
Glucose-Capillary: 113 mg/dL — ABNORMAL HIGH (ref 70–99)
Glucose-Capillary: 215 mg/dL — ABNORMAL HIGH (ref 70–99)

## 2024-10-23 LAB — TSH: TSH: 3.03 u[IU]/mL (ref 0.350–4.500)

## 2024-10-23 LAB — CBG MONITORING, ED: Glucose-Capillary: 123 mg/dL — ABNORMAL HIGH (ref 70–99)

## 2024-10-23 LAB — HEMOGLOBIN A1C
Hgb A1c MFr Bld: 7.9 % — ABNORMAL HIGH (ref 4.8–5.6)
Mean Plasma Glucose: 180.03 mg/dL

## 2024-10-23 LAB — PHOSPHORUS: Phosphorus: 3.6 mg/dL (ref 2.5–4.6)

## 2024-10-23 MED ORDER — CHLORHEXIDINE GLUCONATE CLOTH 2 % EX PADS
6.0000 | MEDICATED_PAD | Freq: Every day | CUTANEOUS | Status: DC
  Administered 2024-10-23 – 2024-10-24 (×2): 6 via TOPICAL

## 2024-10-23 MED ORDER — ALLOPURINOL 100 MG PO TABS
50.0000 mg | ORAL_TABLET | ORAL | Status: DC
  Administered 2024-10-23 – 2024-10-25 (×2): 50 mg via ORAL
  Filled 2024-10-23 (×4): qty 1

## 2024-10-23 MED ORDER — ROSUVASTATIN CALCIUM 20 MG PO TABS
10.0000 mg | ORAL_TABLET | Freq: Every day | ORAL | Status: DC
  Administered 2024-10-24 – 2024-10-26 (×3): 10 mg via ORAL
  Filled 2024-10-23 (×4): qty 1

## 2024-10-23 MED ORDER — SUCRALFATE 1 GM/10ML PO SUSP
1.0000 g | Freq: Two times a day (BID) | ORAL | Status: DC
  Administered 2024-10-23 – 2024-10-26 (×7): 1 g via ORAL
  Filled 2024-10-23 (×7): qty 10

## 2024-10-23 MED ORDER — ACETAMINOPHEN 650 MG RE SUPP: 650.0000 mg | Freq: Four times a day (QID) | RECTAL | Status: DC | PRN

## 2024-10-23 MED ORDER — ACETAMINOPHEN 325 MG PO TABS: 650.0000 mg | ORAL_TABLET | Freq: Four times a day (QID) | ORAL | Status: DC | PRN

## 2024-10-23 MED ORDER — PANTOPRAZOLE SODIUM 40 MG PO TBEC
40.0000 mg | DELAYED_RELEASE_TABLET | Freq: Two times a day (BID) | ORAL | Status: DC
  Administered 2024-10-23 – 2024-10-26 (×7): 40 mg via ORAL
  Filled 2024-10-23 (×7): qty 1

## 2024-10-23 MED ORDER — ONDANSETRON HCL 4 MG/2ML IJ SOLN: 4.0000 mg | Freq: Four times a day (QID) | INTRAMUSCULAR | Status: DC | PRN

## 2024-10-23 MED ORDER — TERAZOSIN HCL 5 MG PO CAPS
5.0000 mg | ORAL_CAPSULE | Freq: Two times a day (BID) | ORAL | Status: DC
  Administered 2024-10-23 – 2024-10-26 (×7): 5 mg via ORAL
  Filled 2024-10-23 (×7): qty 1

## 2024-10-23 MED ORDER — LACTATED RINGERS IV BOLUS
1000.0000 mL | Freq: Once | INTRAVENOUS | Status: AC
  Administered 2024-10-23: 1000 mL via INTRAVENOUS

## 2024-10-23 MED ORDER — EMPAGLIFLOZIN 25 MG PO TABS
25.0000 mg | ORAL_TABLET | Freq: Every day | ORAL | Status: DC
  Administered 2024-10-24 – 2024-10-25 (×2): 25 mg via ORAL
  Filled 2024-10-23 (×4): qty 1

## 2024-10-23 MED ORDER — SODIUM ZIRCONIUM CYCLOSILICATE 10 G PO PACK
10.0000 g | PACK | Freq: Once | ORAL | Status: AC
  Administered 2024-10-23: 10 g via ORAL
  Filled 2024-10-23: qty 1

## 2024-10-23 MED ORDER — ONDANSETRON HCL 4 MG PO TABS: 4.0000 mg | ORAL_TABLET | Freq: Four times a day (QID) | ORAL | Status: DC | PRN

## 2024-10-23 MED ORDER — COLLAGENASE 250 UNIT/GM EX OINT
1.0000 | TOPICAL_OINTMENT | Freq: Every day | CUTANEOUS | Status: DC
  Administered 2024-10-23 – 2024-10-24 (×2): 1 via TOPICAL
  Filled 2024-10-23: qty 30

## 2024-10-23 MED ORDER — INSULIN ASPART 100 UNIT/ML IJ SOLN
0.0000 [IU] | Freq: Every day | INTRAMUSCULAR | Status: DC
  Administered 2024-10-23: 2 [IU] via SUBCUTANEOUS
  Filled 2024-10-23: qty 2

## 2024-10-23 MED ORDER — MECLIZINE HCL 12.5 MG PO TABS: 12.5000 mg | ORAL_TABLET | Freq: Every day | ORAL | Status: DC | PRN

## 2024-10-23 MED ORDER — INSULIN ASPART 100 UNIT/ML IJ SOLN
0.0000 [IU] | Freq: Three times a day (TID) | INTRAMUSCULAR | Status: DC
  Administered 2024-10-23: 2 [IU] via SUBCUTANEOUS
  Administered 2024-10-24: 5 [IU] via SUBCUTANEOUS
  Administered 2024-10-24: 3 [IU] via SUBCUTANEOUS
  Administered 2024-10-24 – 2024-10-25 (×2): 8 [IU] via SUBCUTANEOUS
  Administered 2024-10-25 – 2024-10-26 (×2): 3 [IU] via SUBCUTANEOUS
  Filled 2024-10-23: qty 5
  Filled 2024-10-23: qty 3
  Filled 2024-10-23 (×2): qty 2
  Filled 2024-10-23: qty 8
  Filled 2024-10-23: qty 5
  Filled 2024-10-23: qty 3

## 2024-10-23 MED ORDER — HEPARIN SODIUM (PORCINE) 5000 UNIT/ML IJ SOLN
5000.0000 [IU] | Freq: Three times a day (TID) | INTRAMUSCULAR | Status: DC
  Administered 2024-10-23 – 2024-10-26 (×9): 5000 [IU] via SUBCUTANEOUS
  Filled 2024-10-23 (×9): qty 1

## 2024-10-23 MED ORDER — SODIUM CHLORIDE 0.9% FLUSH
3.0000 mL | Freq: Two times a day (BID) | INTRAVENOUS | Status: DC
  Administered 2024-10-23 – 2024-10-26 (×7): 3 mL via INTRAVENOUS

## 2024-10-23 MED ORDER — LACTATED RINGERS IV SOLN: INTRAVENOUS | Status: AC

## 2024-10-23 MED ORDER — DICYCLOMINE HCL 10 MG PO CAPS: 10.0000 mg | ORAL_CAPSULE | Freq: Three times a day (TID) | ORAL | Status: DC | PRN

## 2024-10-23 MED ORDER — ONDANSETRON HCL 4 MG/2ML IJ SOLN
4.0000 mg | Freq: Once | INTRAMUSCULAR | Status: AC
  Administered 2024-10-23: 4 mg via INTRAVENOUS
  Filled 2024-10-23: qty 2

## 2024-10-23 MED ORDER — CLOPIDOGREL BISULFATE 75 MG PO TABS
75.0000 mg | ORAL_TABLET | Freq: Every day | ORAL | Status: DC
  Administered 2024-10-23 – 2024-10-26 (×4): 75 mg via ORAL
  Filled 2024-10-23 (×4): qty 1

## 2024-10-23 MED ORDER — SODIUM CHLORIDE 0.9 % IV SOLN
1.0000 g | INTRAVENOUS | Status: DC
  Administered 2024-10-23: 1 g via INTRAVENOUS
  Filled 2024-10-23: qty 10

## 2024-10-23 NOTE — ED Notes (Signed)
 Dtg states pt has not walked in over a week and is too weak.  EDP made aware

## 2024-10-23 NOTE — ED Notes (Signed)
 Dtg at bedside, pt does c/o of suprapubic pain, he still has not voided.  He is aware a urine is needed.   Sips of ginger ale given and choc. pudding

## 2024-10-23 NOTE — ED Notes (Signed)
 Inserted FC  Pt tolerated well approx 250 dk amber urine obtained  Specimen sent

## 2024-10-23 NOTE — ED Provider Notes (Signed)
 Patient had presented because of abdominal pain and weakness and not able to urinate.  Workup was unremarkable and he was being discharged but was too weak to get into a wheelchair.  He has been having ongoing problems with nausea and anorexia.  He has not urinated since this morning, but bladder scan only showed 246 mL.  His labs had shown improving renal function but I do note metabolic acidosis.  I have ordered IV fluids, ondansetron  for nausea and will repeat his metabolic panel.  Patient was still unable to urinate, Foley catheter was placed yielding about 250 mL of urine.  After additional fluids, his repeat basic metabolic panel showed improvement in the CO2 although it is still low, creatinine has increased.  Anion gap has improved but still elevated.  He is still too weak to get up, will need hospital admission.   Raford Lenis, MD 10/23/24 [phone]

## 2024-10-23 NOTE — H&P (Signed)
 " History and Physical    Jamir Rone FMW:[id number] DOB: 29-Nov-1940 DOA: 10/22/2024  PCP: Charlott Dorn LABOR, MD  Patient coming from: Home  I have personally briefly reviewed patient's old medical records in [facility]  Chief Complaint: Abdominal pain and generalized weakness  HPI: Aquilla Voiles is a 84 y.o. 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  Per patient, for the last 2 days, he has not been able to keep anything down, he has been drinking only slight water.  Anytime he tries to eat anything solid, he vomits.  He also has been having abdominal pain for last 2 days.  The pain is located in the epigastric area and according to him, radiates to the periumbilical and then suprapubic area and he describes the pain as dull, 8 out of 10, aggravates with eating anything and has no relieving factor.  It waxes and wanes.  He denies any chest pain, shortness of breath, fever, chills, sweating, any problem with urination.  Tells me that the last time he urinated was 2 days ago and was little amount in the last time he had a bowel movement was also 2 days ago.  No sick contact or recent travel.  ED Course: Upon arrival to ED, patient was hemodynamically stable, dropped his blood pressure to 89/66 at 1 point in time.  Mild bradycardia which was also transient.  CKD at baseline but was found to have mild hyperkalemia along with metabolic acidosis.  Also had  urinary retention, indwelling Foley catheter was placed.  CT abdomen pelvis unremarkable.  Lipase normal.  UA showed some bacteria and crystals, no nitrites, no leukoesterase no white blood cells and does not have leukocytosis.  However started on Rocephin  for presumed UTI.  Hospitalist requested for admission.  Review of Systems: As per HPI otherwise negative.    Past Medical History:  Diagnosis Date   Allergic rhinitis    Anemia    Aortic stenosis    Arthritis    Basal cell carcinoma 11/01/2019    bcc left chest treatment TX cx3 [id number]    Below-knee amputation of right lower extremity (HCC) 12/12/2020   CHF (congestive heart failure) (HCC)    Chronic leg pain    right   Chronic lower back pain    Coronary artery disease    a. Stenting to RCA 2004; staged DES to LAD and Cx 2004. DES to [facility] 2012. b. DES to mCx, PTCA to dCx 11/2011. c. Lateral wall MI 2013 s/p PTCA to distal Cx & DES to mid OM2 11/2011. d. Low risk nuc 04/2014, EF wnl.   COVID-19    Dehiscence of amputation stump (HCC)    Diabetes mellitus    Insulin  dependent   Diabetic neuropathy (HCC)    MILD   Diverticulosis    Dysrhythmia    Edema of both lower extremities 07/05/2023   Fecal impaction (HCC) 02/01/2021   Gilbert syndrome    Gout    right wrist; right foot; right elbow; have had it since 1970's  H/O hiatal hernia    Heart murmur    Hip dislocation, right (HCC) 04/13/2019   History of echocardiogram    aortic sclerosis per echo 12/09 EF 65%, otherwise normal   History of hemorrhoids    BLEEDING   History of kidney stones    h/o   Hypertension    Diagnosed 1995    Myocardial infarction [facility])    Pancreatic pseudocyst    a. s/p remote drainage 2006.   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   Status post percutaneous transluminal coronary angioplasty  01/06/2021   Thrombocytopenia    Seen on oldest labs in system from 2004   Vertigo    Vitamin B 12 deficiency    orally replaced    Past Surgical History:  Procedure Laterality Date   ABDOMINAL AORTOGRAM W/LOWER EXTREMITY Bilateral 08/08/2020   Procedure: ABDOMINAL AORTOGRAM W/LOWER EXTREMITY;  Surgeon: Eliza Lonni RAMAN, MD;  Location: [facility] INVASIVE CV LAB;  Service: Cardiovascular;  Laterality: Bilateral;   AMPUTATION Right 12/12/2020   Procedure: RIGHT BELOW KNEE AMPUTATION;  Surgeon: Harden Jerona GAILS, MD;  Location: [facility] OR;  Service: Orthopedics;  Laterality: Right;   BACK SURGERY     total of 3 times S/P fall    CARPAL TUNNEL RELEASE Bilateral    CHOLECYSTECTOMY  1990's   CIRCUMCISION N/A 10/12/2023   Procedure: Dorsal slit;  Surgeon: [name], MD;  Location: [facility] ORS;  Service: Urology;  Laterality: N/A;   COLONOSCOPY     COLONOSCOPY Left 07/08/2023   Procedure: COLONOSCOPY;  Surgeon: San Sandor GAILS, DO;  Location: [facility] ENDOSCOPY;  Service: Gastroenterology;  Laterality: Left;   CORONARY ANGIOPLASTY  11/11/11   CORONARY ANGIOPLASTY WITH STENT PLACEMENT  09/30/2011   1 then; makes a total of 4   CORONARY ANGIOPLASTY WITH STENT PLACEMENT  11/11/11   1; makes a total of 5   ESOPHAGOGASTRODUODENOSCOPY Left 07/08/2023   Procedure: ESOPHAGOGASTRODUODENOSCOPY (EGD);  Surgeon: San Sandor GAILS, DO;  Location: [facility] ENDOSCOPY;  Service: Gastroenterology;  Laterality: Left;   HEMOSTASIS CLIP PLACEMENT  07/08/2023   Procedure: HEMOSTASIS CLIP PLACEMENT;  Surgeon: San Sandor GAILS, DO;  Location: [facility] ENDOSCOPY;  Service: Gastroenterology;;   HOT HEMOSTASIS N/A 07/08/2023   Procedure: HOT HEMOSTASIS (ARGON PLASMA COAGULATION/BICAP);  Surgeon: San Sandor GAILS, DO;  Location: [facility] ENDOSCOPY;  Service: Gastroenterology;  Laterality: N/A;   INGUINAL HERNIA REPAIR  2003   right   JOINT REPLACEMENT Right 04/03/2002   hip replacment   KNEE ARTHROSCOPY  1990's   left   LEFT HEART CATH AND  CORONARY ANGIOGRAPHY N/A 04/25/2023   Procedure: LEFT HEART CATH AND CORONARY ANGIOGRAPHY;  Surgeon: Anner Alm ORN, MD;  Location: [facility] INVASIVE CV LAB;  Service: Cardiovascular;  Laterality: N/A;   LEFT HEART CATHETERIZATION WITH CORONARY ANGIOGRAM N/A 09/30/2011   Procedure: LEFT HEART CATHETERIZATION WITH CORONARY ANGIOGRAM;  Surgeon: Candyce RAMAN Reek, MD;  Location: [facility] CATH LAB;  Service: Cardiovascular;  Laterality: N/A;  possible PCI   LEFT HEART CATHETERIZATION WITH CORONARY ANGIOGRAM N/A 11/15/2011   Procedure: LEFT HEART CATHETERIZATION WITH CORONARY ANGIOGRAM;  Surgeon: Candyce RAMAN Reek, MD;  Location: [facility] CATH LAB;  Service: Cardiovascular;  Laterality: N/A;   PERCUTANEOUS CORONARY STENT INTERVENTION (PCI-S)  09/30/2011   Procedure: PERCUTANEOUS CORONARY STENT INTERVENTION (PCI-S);  Surgeon: Candyce RAMAN Reek, MD;  Location: [facility] CATH LAB;  Service:  Cardiovascular;;   PERCUTANEOUS CORONARY STENT INTERVENTION (PCI-S) N/A 11/11/2011   Procedure: PERCUTANEOUS CORONARY STENT INTERVENTION (PCI-S);  Surgeon: Candyce GORMAN Reek, MD;  Location: [facility] CATH LAB;  Service: Cardiovascular;  Laterality: N/A;   PERIPHERAL VASCULAR BALLOON ANGIOPLASTY Right 08/08/2020   Procedure: PERIPHERAL VASCULAR BALLOON ANGIOPLASTY;  Surgeon: Eliza Lonni GORMAN, MD;  Location: [facility] INVASIVE CV LAB;  Service: Cardiovascular;  Laterality: Right;  Posterior tibial    SHOULDER SURGERY Right    X 2   STUMP REVISION Right 01/09/2021   Procedure: REVISION RIGHT BELOW KNEE AMPUTATION;  Surgeon: Harden Jerona GAILS, MD;  Location: [facility] OR;  Service: Orthopedics;  Laterality: Right;   TONSILLECTOMY  ~ 1948   TOTAL HIP REVISION Right 04/13/2019   Procedure: RIGHT TOTAL HIP REVISION-POSTERIOR  APPROACH LATERAL;  Surgeon: Barbarann Oneil BROCKS, MD;  Location: [facility] OR;  Service: Orthopedics;  Laterality: Right;   TOTAL KNEE ARTHROPLASTY Left 07/23/2016   Procedure: LEFT TOTAL KNEE ARTHROPLASTY;  Surgeon: Oneil BROCKS Barbarann, MD;  Location: [facility] OR;   Service: Orthopedics;  Laterality: Left;     reports that he has quit smoking. His smoking use included cigarettes. His smokeless tobacco use includes chew. He reports that he does not drink alcohol and does not use drugs.  Allergies[1]  Family History  Problem Relation Age of Onset   Diabetes Mother    Hyperlipidemia Mother    Hypertension Mother    Cancer Father    Hypertension Father    Diabetes Sister    Hypertension Sister    Cancer Brother    Heart attack Neg Hx     Prior to Admission medications  Medication Sig Start Date End Date Taking? Authorizing Provider  acetaminophen  (TYLENOL ) 500 MG tablet Take 2 tablets (1,000 mg total) by mouth every 6 (six) hours. Patient taking differently: Take 1,000 mg by mouth daily as needed for mild pain (pain score 1-3) or moderate pain (pain score 4-6). 10/12/23   [name], MD  allopurinol  (ZYLOPRIM ) 100 MG tablet Take 0.5 tablets (50 mg total) by mouth every other day. 09/25/24 10/25/24  Sigdel, Santosh, MD  Ascorbic Acid (VITAMIN C PO) Take 1 tablet by mouth daily.    [provider]  Cholecalciferol  (VITAMIN D -3 PO) Take 1 capsule by mouth daily.    [provider]  [Paused] clopidogrel  (PLAVIX ) 75 MG tablet Take 1 tablet (75 mg total) by mouth daily. Wait to take this until your doctor or other care provider tells you to start again. 09/21/24   [name], PA-C  Continuous Glucose Sensor (DEXCOM G7 SENSOR) MISC 1 Device by Does not apply route as directed. 09/10/24   [name], [name], MD  Cyanocobalamin  (VITAMIN B-12 PO) Take 1 tablet by mouth daily.    [provider]  dicyclomine  (BENTYL ) 10 MG capsule Take 1 capsule (10 mg total) by mouth 3 (three) times daily as needed for spasms. 09/19/24 10/19/24  Regalado, Owen A, MD  [Paused] empagliflozin  (JARDIANCE ) 25 MG TABS tablet Take 25 mg by mouth daily. Wait to take this until your doctor or other care provider tells you to start again.     [provider]  furosemide  (LASIX ) 20 MG tablet Take 1 tablet (20 mg total) by mouth daily. 10/12/24   Clegg, Amy D, NP  gabapentin  (NEURONTIN ) 300 MG capsule Take 1 capsule (300 mg total) by mouth at bedtime. Patient taking differently: Take 300 mg by mouth daily. 09/19/24 10/19/24  Regalado, Owen LABOR, MD  insulin  aspart (  NOVOLOG  FLEXPEN) 100 UNIT/ML FlexPen Max daily 30 units Patient taking differently: Inject 30 Units into the skin See admin instructions. Inject 1 dose every morning and inject 1 dose at night as needed for hyperglycemia - per sliding scale. Max daily dose of 30 units. 06/08/24   [name], [name], MD  insulin  degludec (TRESIBA  FLEXTOUCH) 100 UNIT/ML FlexTouch Pen Inject 5 Units into the skin daily. Patient taking differently: Inject 5 Units into the skin every evening. 09/19/24   Regalado, Belkys A, MD  Insulin  Pen Needle 32G X 4 MM MISC 1 Device by Does not apply route in the morning, at noon, in the evening, and at bedtime. 06/08/24   [name], [name], MD  MAGNESIUM  PO Take 1 tablet by mouth daily.    [provider]  meclizine  (ANTIVERT ) 12.5 MG tablet Take 1 tablet (12.5 mg total) by mouth daily as needed for dizziness. 10/06/24   Laurence Locus, DO  metFORMIN  (GLUCOPHAGE ) 1000 MG tablet Take 1,000 mg by mouth 2 (two) times daily.    [provider]  nitroGLYCERIN  (NITROSTAT ) 0.4 MG SL tablet Place 1 tablet (0.4 mg total) under the tongue every 5 (five) minutes x 3 doses as needed for chest pain. 04/16/24   [name], MD  pantoprazole  (PROTONIX ) 40 MG tablet Take 1 tablet (40 mg total) by mouth 2 (two) times daily. 09/19/24   Regalado, Belkys A, MD  [Paused] potassium chloride  SA (KLOR-CON  M) 20 MEQ tablet Take 20 mEq by mouth every evening. Wait to take this until your doctor or other care provider tells you to start again.    [provider]  [Paused] rosuvastatin  (CRESTOR ) 20 MG tablet Take 1 tablet (20 mg total) by mouth  daily. Patient taking differently: Take 20 mg by mouth every evening. Wait to take this until your doctor or other care provider tells you to start again. 07/27/24   [name], MD  sucralfate  (CARAFATE ) 1 GM/10ML suspension Take 10 mLs (1 g total) by mouth 2 (two) times daily. 09/19/24   Regalado, Belkys A, MD  terazosin  (HYTRIN ) 5 MG capsule Take 5 mg by mouth 2 (two) times daily.    [provider]  TURMERIC CURCUMIN PO Take 1 tablet by mouth daily.    [provider]    Physical Exam: Vitals:   10/23/24 0445 10/23/24 0748 10/23/24 0755 10/23/24 0911  BP: 93/71 (!) 89/66  103/73  Pulse: 72  87 (!) 51  Resp: 18   18  Temp:    98.6 F (37 C)  TempSrc:    Oral  SpO2: 98%  99% 100%    Constitutional: NAD, calm, comfortable Vitals:   10/23/24 0445 10/23/24 0748 10/23/24 0755 10/23/24 0911  BP: 93/71 (!) 89/66  103/73  Pulse: 72  87 (!) 51  Resp: 18   18  Temp:    98.6 F (37 C)  TempSrc:    Oral  SpO2: 98%  99% 100%   Eyes: PERRL, lids and conjunctivae normal, appears very frail ENMT: Mucous membranes are dry. Posterior pharynx clear of any exudate or lesions.Normal dentition.  Neck: normal, supple, no masses, no thyromegaly Respiratory: clear to auscultation bilaterally, no wheezing, no crackles. Normal respiratory effort. No accessory muscle use.  Cardiovascular: Regular rate and rhythm, no murmurs / rubs / gallops.  +2-3 pitting edema left lower extremity, right BKA.. 2+ pedal pulses. No carotid bruits.  Abdomen: no tenderness, no masses palpated. No hepatosplenomegaly. Bowel sounds positive.  Skin: no rashes, lesions,  ulcers. No induration Neurologic: CN 2-12 grossly intact. Sensation intact, DTR normal. Strength 5/5 in all 4.  Psychiatric: Normal judgment and insight. Alert and oriented x 3. Normal mood.    Labs on Admission: I have personally reviewed following labs and imaging studies  CBC: Recent Labs  Lab 10/22/24 2015  WBC 5.6  HGB 10.2*   HCT 33.0*  MCV 99.1  PLT 75*   Basic Metabolic Panel: Recent Labs  Lab 10/22/24 2105 10/23/24 0417  NA 136 135  K 5.3* 5.4*  CL 102 99  CO2 15* 20*  GLUCOSE 185* 179*  BUN 38* 41*  CREATININE 2.24* 2.44*  CALCIUM  8.8* 9.6   GFR: Estimated Creatinine Clearance: 21.1 mL/min (A) (by C-G formula based on SCr of 2.44 mg/dL (H)). Liver Function Tests: Recent Labs  Lab 10/22/24 2105  AST 72*  ALT 60*  ALKPHOS 126  BILITOT 0.8  PROT 5.7*  ALBUMIN  3.6   Recent Labs  Lab 10/22/24 2105  LIPASE 27   No results for input(s): AMMONIA in the last 168 hours. Coagulation Profile: No results for input(s): INR, PROTIME in the last 168 hours. Cardiac Enzymes: No results for input(s): CKTOTAL, CKMB, CKMBINDEX, TROPONINI in the last 168 hours. BNP (last 3 results) Recent Labs    05/14/24 1416 09/23/24 1117 10/05/24 1620  PROBNP 1,468.0* 20,065.0* 27,900.0*   HbA1C: No results for input(s): HGBA1C in the last 72 hours. CBG: Recent Labs  Lab 10/23/24 0746  GLUCAP 123*   Lipid Profile: No results for input(s): CHOL, HDL, LDLCALC, TRIG, CHOLHDL, LDLDIRECT in the last 72 hours. Thyroid  Function Tests: No results for input(s): TSH, T4TOTAL, FREET4, T3FREE, THYROIDAB in the last 72 hours. Anemia Panel: No results for input(s): VITAMINB12, FOLATE, FERRITIN, TIBC, IRON , RETICCTPCT in the last 72 hours. Urine analysis:    Component Value Date/Time   COLORURINE YELLOW 10/23/2024 0340   APPEARANCEUR CLEAR 10/23/2024 0340   LABSPEC 1.018 10/23/2024 0340   PHURINE 5.0 10/23/2024 0340   GLUCOSEU NEGATIVE 10/23/2024 0340   HGBUR MODERATE (A) 10/23/2024 0340   BILIRUBINUR NEGATIVE 10/23/2024 0340   KETONESUR NEGATIVE 10/23/2024 0340   PROTEINUR 30 (A) 10/23/2024 0340   NITRITE NEGATIVE 10/23/2024 0340   LEUKOCYTESUR NEGATIVE 10/23/2024 0340    Radiological Exams on Admission: CT ABDOMEN PELVIS WO CONTRAST Result Date:  10/22/2024 EXAM: CT ABDOMEN AND PELVIS WITHOUT CONTRAST 10/22/2024 09:48:46 PM TECHNIQUE: CT of the abdomen and pelvis was performed without the administration of intravenous contrast. Multiplanar reformatted images are provided for review. Automated exposure control, iterative reconstruction, and/or weight-based adjustment of the mA/kV was utilized to reduce the radiation dose to as low as reasonably achievable. COMPARISON: 09/23/2024 CLINICAL HISTORY: Abdominal pain, acute, nonlocalized. FINDINGS: LOWER CHEST: Small right and trace left pleural effusions, chronic. Mild patchy right lower lobe opacity, likely atelectasis. Severe 3 vessel coronary atherosclerosis. Hypodense blood pool relative to myocardium, suggesting anemia. LIVER: The liver is unremarkable. GALLBLADDER AND BILE DUCTS: Status post cholecystectomy. No biliary ductal dilatation. SPLEEN: No acute abnormality. PANCREAS: No acute abnormality. ADRENAL GLANDS: No acute abnormality. KIDNEYS, URETERS AND BLADDER: Multiple nonobstructing bilateral renal calculi measuring up to 5 mm, unchanged. No hydronephrosis. No perinephric or periureteral stranding. Urinary bladder is unremarkable. GI AND BOWEL: Suture line along the stomach. There is no bowel obstruction. PERITONEUM AND RETROPERITONEUM: Small to moderate abdominopelvic ascites, mildly progressive. No free air. VASCULATURE: Atherosclerotic calcifications of the abdominal aorta and branch vessels. LYMPH NODES: No lymphadenopathy. REPRODUCTIVE ORGANS: Mild prostatomegaly. BONES AND SOFT TISSUES: Right hip  arthroplasty. Degenerative changes of the visualized thoracolumbar spine. No focal soft tissue abnormality. IMPRESSION: 1. No acute abdominopelvic abnormality. 2. Small to moderate abdominopelvic ascites, mildly progressive. 3. Small right and trace left pleural effusions, unchanged. 4. Hypodense blood pool relative to myocardium, which can be seen with anemia. Electronically signed by: Pinkie Pebbles  MD 10/22/2024 09:52 PM EST RP Workstation: [id number]   Assessment/Plan Principal Problem:   UTI (urinary tract infection)   Abdominal pain/anorexia/nausea: Symptoms are improving.  No etiology was found on the CT abdomen and pelvis.  Only small to moderate ascites.  Treat symptomatically with supportive care, continue IV fluids.  Asymptomatic bacteriuria: Patient denies having any urinary complaints.  UA shows crystals and some bacteria but negative nitrites and leukoesterase.  No fever, no leukocytosis, CT abdomen pelvis negative for any inflammation in the bladder or hydronephrosis or pyelonephritis.  Patient has been started on Rocephin  empirically.  I do not believe patient has true UTI.  Will discontinue Rocephin .  High anion gap metabolic acidosis with CKD stage IV: Baseline creatinine around 2.5, patient's creatinine is at baseline however he presented with CO2 of 15 which has improved to 20 today after IV fluids.  Metabolic acidosis and anion gap is improving.  Continue IV fluids.  Repeat labs in the morning.  Hyperkalemia: 5.3 upon presentation.  Unsure if he received any medications for that.  Will give him a dose of Lokelma .  Monitor on telemetry.  Check potassium later today and act on that.  History of BPH now with acute urinary retention: Indwelling Foley catheter was placed in the ED.  Resuming terazosin .  Will mobilize.  Will plan to remove Foley catheter either later today or tomorrow morning.  Generalized weakness: Consulted PT OT.  History of gout: Resume allopurinol .  Chronic combined systolic and diastolic congestive heart failure: Patient appears dry..  Holding Lasix  for now due to metabolic acidosis but resuming Jardiance .  Continue IV fluids.  Hyperlipidemia: Resume Crestor .  Type 2 diabetes mellitus: Last hemoglobin A1c 9.0 about a year ago.  PTA medications include Jardiance , NovoLog  FlexPen insulin  as needed and Tresiba  insulin  5 units daily.  Recheck hemoglobin  A1c.  Blood sugar appears to be stable at the moment.  I am not sure how much he is going to eat so at this point in time, I am only going to cover him with SSI.  History of paroxysmal atrial fibrillation: Currently in sinus rhythm.  Does not appear to be on any rate control medications or anticoagulation, likely due to anemia and thrombocytopenia.  Anemia of chronic disease: Hemoglobin at baseline around 10, currently at baseline.  Monitor closely.  No signs of bleeding.  Denies any hematochezia or melena.  Chronic thrombocytopenia: Stable.  No signs of bleeding.  DVT prophylaxis: heparin  injection 5,000 Units Start: 10/23/24 1400 Code Status: DNI but he would like chest compressions if needed. Family Communication: None present at bedside.  Plan of care discussed with patient in length and he verbalized understanding and agreed with it. Disposition Plan: Possibly in 1 to 2 days Consults called: None  Fredia Skeeter MD [facility]  *Please note that this is a verbal dictation therefore any spelling or grammatical errors are due to the Dragon Medical One system interpretation.  Please page via Amion and do not message via secure chat for urgent patient care matters. Secure chat can be used for non urgent patient care matters. 10/23/2024, 9:49 AM  To contact the attending provider between 7A-7P or the covering provider  during after hours 7P-7A, please log into the web site [email or web address]     [1]  Allergies Allergen Reactions   Zetia [Ezetimibe] Other (See Comments)    Myalgias    Zocor [Simvastatin] Other (See Comments)    Myalgias     Entresto  [Sacubitril -Valsartan ] Other (See Comments)    Discontinued by MD due to persistent hypotension    Imdur  [Isosorbide  Nitrate] Other (See Comments)    Discontinued by MD due to persistent hypotension with isosorbide  ER 60mg .   Toprol  Xl [Metoprolol ] Other (See Comments)    Discontinued by MD due to persistent hypotension at 12.5mg      Dilaudid  [Hydromorphone  Hcl] Other (See Comments)    Hallucinations    "

## 2024-10-23 NOTE — Plan of Care (Signed)

## 2024-10-23 NOTE — Care Management (Signed)
 SDOH resources added to AVS

## 2024-10-23 NOTE — Consult Note (Addendum)
 WOC Nurse Consult Note: Reason for Consult: Consult requested for sacrum and left foot. Performed remotely after review of progress notes and photos in the EMR.  Sacrum/gluteal fold with Stage 3/Unstageable chronic pressure injury, approx 50% red. 50% yellow. White macerated skin surrounding    Left anterior great toe and anterior 2nd toe and tip of toe with red dry scabbed full thickness wounds.    Topical treatment orders provided for bedside nurses to perform as follows:  1. Apply Santyl  to sacrum/gluteal fold Q day, then cover with moist 2X2 gauze and foam dressing.  Change foam dressing Q 3 days or PRN soiling.  2. Apply Xeroform gauze to left foot/toes Q day, then cover with kerlex.  Please re-consult if further assistance is needed.  Thank-you,  Stephane Fought MSN, RN, CWOCN, CWCN-AP, CNS Contact Mon-Fri 0700-1500: [phone]

## 2024-10-24 DIAGNOSIS — E875 Hyperkalemia: Secondary | ICD-10-CM | POA: Diagnosis not present

## 2024-10-24 LAB — COMPREHENSIVE METABOLIC PANEL WITH GFR
ALT: 45 U/L — ABNORMAL HIGH (ref 0–44)
AST: 34 U/L (ref 15–41)
Albumin: 3.3 g/dL — ABNORMAL LOW (ref 3.5–5.0)
Alkaline Phosphatase: 124 U/L (ref 38–126)
Anion gap: 14 (ref 5–15)
BUN: 44 mg/dL — ABNORMAL HIGH (ref 8–23)
CO2: 19 mmol/L — ABNORMAL LOW (ref 22–32)
Calcium: 9.1 mg/dL (ref 8.9–10.3)
Chloride: 97 mmol/L — ABNORMAL LOW (ref 98–111)
Creatinine, Ser: 2.45 mg/dL — ABNORMAL HIGH (ref 0.61–1.24)
GFR, Estimated: 25 mL/min — ABNORMAL LOW
Glucose, Bld: 200 mg/dL — ABNORMAL HIGH (ref 70–99)
Potassium: 5.6 mmol/L — ABNORMAL HIGH (ref 3.5–5.1)
Sodium: 129 mmol/L — ABNORMAL LOW (ref 135–145)
Total Bilirubin: 0.7 mg/dL (ref 0.0–1.2)
Total Protein: 5.7 g/dL — ABNORMAL LOW (ref 6.5–8.1)

## 2024-10-24 LAB — GLUCOSE, CAPILLARY
Glucose-Capillary: 257 mg/dL — ABNORMAL HIGH (ref 70–99)
Glucose-Capillary: 246 mg/dL — ABNORMAL HIGH (ref 70–99)
Glucose-Capillary: 159 mg/dL — ABNORMAL HIGH (ref 70–99)
Glucose-Capillary: 87 mg/dL (ref 70–99)

## 2024-10-24 LAB — CBC
HCT: 29.9 % — ABNORMAL LOW (ref 39.0–52.0)
Hemoglobin: 9.5 g/dL — ABNORMAL LOW (ref 13.0–17.0)
MCH: 31.4 pg (ref 26.0–34.0)
MCHC: 31.8 g/dL (ref 30.0–36.0)
MCV: 98.7 fL (ref 80.0–100.0)
Platelets: 75 K/uL — ABNORMAL LOW (ref 150–400)
RBC: 3.03 MIL/uL — ABNORMAL LOW (ref 4.22–5.81)
RDW: 17.3 % — ABNORMAL HIGH (ref 11.5–15.5)
WBC: 5.6 K/uL (ref 4.0–10.5)
nRBC: 0 % (ref 0.0–0.2)

## 2024-10-24 LAB — URINE CULTURE: Culture: NO GROWTH

## 2024-10-24 LAB — POTASSIUM: Potassium: 4.8 mmol/L (ref 3.5–5.1)

## 2024-10-24 MED ORDER — CALCIUM GLUCONATE-NACL 1-0.675 GM/50ML-% IV SOLN
1.0000 g | Freq: Once | INTRAVENOUS | Status: AC
  Administered 2024-10-24: 1000 mg via INTRAVENOUS
  Filled 2024-10-24: qty 50

## 2024-10-24 MED ORDER — SODIUM ZIRCONIUM CYCLOSILICATE 10 G PO PACK
10.0000 g | PACK | Freq: Once | ORAL | Status: AC
  Administered 2024-10-24: 10 g via ORAL
  Filled 2024-10-24: qty 1

## 2024-10-24 MED ORDER — INSULIN ASPART 100 UNIT/ML IV SOLN
10.0000 [IU] | Freq: Once | INTRAVENOUS | Status: AC
  Administered 2024-10-24: 10 [IU] via INTRAVENOUS
  Filled 2024-10-24: qty 10

## 2024-10-24 MED ORDER — DEXTROSE 50 % IV SOLN
1.0000 | Freq: Once | INTRAVENOUS | Status: AC
  Administered 2024-10-24: 50 mL via INTRAVENOUS
  Filled 2024-10-24: qty 50

## 2024-10-24 MED ORDER — SODIUM CHLORIDE 0.9 % IV SOLN: INTRAVENOUS | Status: AC

## 2024-10-24 NOTE — Inpatient Diabetes Management (Signed)
 Inpatient Diabetes Program Recommendations  AACE/ADA: New Consensus Statement on Inpatient Glycemic Control (2015)  Target Ranges:  Prepandial:   less than 140 mg/dL      Peak postprandial:   less than 180 mg/dL (1-2 hours)      Critically ill patients:  140 - 180 mg/dL   Lab Results  Component Value Date   GLUCAP 246 (H) 10/24/2024   HGBA1C 7.9 (H) 10/23/2024    Review of Glycemic Control  Latest Reference Range & Units 10/23/24 07:46 10/23/24 12:02 10/23/24 16:10 10/23/24 20:20 10/24/24 09:03 10/24/24 11:45  Glucose-Capillary 70 - 99 mg/dL 876 (H) 890 (H) 886 (H) 215 (H) 257 (H) 246 (H)  (H): Data is abnormally high  Diabetes history: DM2  Outpatient Diabetes medications:  Tresiba  5 units every day Novolog  Correction Jardiance  25 mg every day Dexcom G7  Current orders for Inpatient glycemic control:  Novolog  0-15 units TID and 0-5 units at bedtime Jardiance  25 mg QD  Inpatient Diabetes Program Recommendations:    Semglee  5 units every day  Thank you, Wyvonna Pinal, MSN, CDCES Diabetes Coordinator Inpatient Diabetes Program [phone] (team pager from 8a-5p)

## 2024-10-24 NOTE — Evaluation (Signed)
 Physical Therapy Evaluation Patient Details Name: Keith Espinoza MRN: [id number] DOB: June 16, 1941 Today's Date: 10/24/2024  History of Present Illness  84 y.o. male admitted 10/22/24 with c/o weakness, nausea/vomiting, abdominal pain. Workup for urinary retention, hypotension, presumed UTI; pt was preparing for d/c but too weak to get into wheelchair. Wound care consulted for wounds to sacrum, L foot. PMH includes R BKA (2022), CAD, CHF, DM, HTN, vertigo.   Clinical Impression  Pt presents with an overall decrease in functional mobility secondary to above. PTA, pt stopped walking a few months ago, relies on wheelchair for mobility, requiring assist from family for stand pivot transfers and ADL/iADLs. Today, pt tolerating prolonged sitting EOB but adamantly declines standing or transfer attempts; pt repeatedly states, I'm too weak to do anything despite attempts at educ on role of acute PT. Pt expressing multiple concerns, including decreased desire to eat, wanting to be comfortable and left alone, etc; reached out to MD for Palliative Medicine consult. Pt not interested in SNF rehab but daughter hopeful HHPT services could start again. Will follow acutely to address established goals.       If plan is discharge home, recommend the following: A lot of help with walking and/or transfers;A lot of help with bathing/dressing/bathroom;Assistance with cooking/housework;Assist for transportation;Help with stairs or ramp for entrance   Can travel by private vehicle    TBD    Equipment Recommendations None recommended by PT  Recommendations for Other Services   Palliative Medicine    Functional Status Assessment Patient has had a recent decline in their functional status and demonstrates the ability to make significant improvements in function in a reasonable and predictable amount of time.     Precautions / Restrictions Precautions Precautions: Fall Recall of Precautions/Restrictions:  Intact Precaution/Restrictions Comments: h/o R BKA (prosthetic in room, refused to wear 10/24/24) Restrictions Weight Bearing Restrictions Per Provider Order: No      Mobility  Bed Mobility Overal bed mobility: Needs Assistance Bed Mobility: Sit to Supine       Sit to supine: Supervision   General bed mobility comments: pt received sitting EOB, able to lay himself down with HOB partially elevated without assist    Transfers                   General transfer comment:  (pt adamantly declines)    Ambulation/Gait                  Stairs            Wheelchair Mobility     Tilt Bed    Modified Rankin (Stroke Patients Only)       Balance Overall balance assessment: Needs assistance Sitting-balance support: Feet supported, No upper extremity supported Sitting balance-Leahy Scale: Good Sitting balance - Comments: prolonged sitting EOB                                     Pertinent Vitals/Pain Pain Assessment Pain Assessment: Faces Faces Pain Scale: Hurts little more Pain Location: generalized Pain Descriptors / Indicators: Discomfort, Tiring Pain Intervention(s): Monitored during session, Limited activity within patient's tolerance    Home Living Family/patient expects to be discharged to:: Private residence Living Arrangements: Alone Available Help at Discharge: Family;Available PRN/intermittently Type of Home: House Home Access: Level entry       Home Layout: Able to live on main level with bedroom/bathroom Home Equipment: Agricultural Consultant (  2 wheels);BSC/3in1;Shower seat;Cane - single point;Wheelchair - manual;Wheelchair - power;Hand held shower head;Grab bars - tub/shower;Grab bars - toilet;Adaptive equipment;Lift chair;Other (comment) Additional Comments: daughter reports pt does not like to use BSC (so not interested in drop-arm [facility]), doesn't like slide boards    Prior Function Prior Level of Function : Needs assist              Mobility Comments: pt and daughter report that pt can perform lateral scoot to wheelchair; assist for stand pivot transfer to toilet and shower chair; reports wearing RLE prosthetic all of the time. daughter reports pt was ambulating a few months ago, but mobility and desire to move has gone down, pt also not eating. recently finished HHPT ADLs Comments: Pt functions at w/c-level with ADLS and has had some increase in assist with showers from daughter due to weakness     Extremity/Trunk Assessment   Upper Extremity Assessment Upper Extremity Assessment: Generalized weakness    Lower Extremity Assessment Lower Extremity Assessment: Generalized weakness;RLE deficits/detail RLE Deficits / Details: h/o R BKA    Cervical / Trunk Assessment Cervical / Trunk Assessment: Kyphotic  Communication   Communication Communication: Impaired Factors Affecting Communication: Hearing impaired    Cognition Arousal: Alert Behavior During Therapy: WFL for tasks assessed/performed   PT - Cognitive impairments: Difficult to assess, Awareness, Problem solving Difficult to assess due to:  (decreased desire to participate)                     PT - Cognition Comments: pt generally frustrated regarding current situation with decreased desire to participate, repeatedly stating, I can't do anything because I'm too weak. generally not receptive to PT input regarding mobility recmomendations; daughter present and it seems this is his baseline (he's going to do what he wants to do) Following commands: Intact       Cueing Cueing Techniques: Verbal cues     General Comments General comments (skin integrity, edema, etc.): pt's daughter ([name]) present and supportive; educ re: role of acute PT, POC, activity recommendations, importance of mobility, therex/AROM (HEP provided), potential d/c needs. increased time discussing DME and assist needs for home, daughter clarifies which DME pt will  not use, she feels they are able to continue providing necessary assist. when asked, pt adamantly declines SNF rehab. daughter states he does not eat, to which pt states he won't eat if it doesn't taste good, even if that's what he needs to survive. expressing many thoughts I feel would be appropriately addressed by palliative medicine; reached out to Dr. Vernon for consult    Exercises Other Exercises Other Exercises: [employer] HEP provided for generalized BLE strengthening supine/seated; pt accepting handout but not willing to look it over right now   Assessment/Plan    PT Assessment Patient needs continued PT services  PT Problem List Decreased strength;Decreased activity tolerance;Decreased balance;Decreased mobility;Decreased knowledge of use of DME;Pain       PT Treatment Interventions DME instruction;Functional mobility training;Therapeutic activities;Therapeutic exercise;Balance training;Patient/family education;Wheelchair mobility training    PT Goals (Current goals can be found in the Care Plan section)  Acute Rehab PT Goals Patient Stated Goal: be left alone, go home PT Goal Formulation: With patient Time For Goal Achievement: 11/07/24 Potential to Achieve Goals: Fair    Frequency Min 2X/week     Co-evaluation               AM-PAC PT 6 Clicks Mobility  Outcome Measure Help needed turning from your back  to your side while in a flat bed without using bedrails?: A Little Help needed moving from lying on your back to sitting on the side of a flat bed without using bedrails?: A Little Help needed moving to and from a bed to a chair (including a wheelchair)?: A Lot Help needed standing up from a chair using your arms (e.g., wheelchair or bedside chair)?: A Lot Help needed to walk in hospital room?: Total Help needed climbing 3-5 steps with a railing? : Total 6 Click Score: 12    End of Session   Activity Tolerance: Patient limited by fatigue Patient left: in  bed;with call bell/phone within reach;with bed alarm set;with family/visitor present Nurse Communication: Mobility status PT Visit Diagnosis: Other abnormalities of gait and mobility (R26.89);Muscle weakness (generalized) (M62.81)    Time: 9051-8993 PT Time Calculation (min) (ACUTE ONLY): 18 min   Charges:   PT Evaluation $PT Eval Moderate Complexity: 1 Mod   PT General Charges $$ ACUTE PT VISIT: 1 Visit       Darice Almas, PT, DPT Acute Rehabilitation Services  Personal: Secure Chat Rehab Office: [phone]  Darice LITTIE Almas 10/24/2024, 12:26 PM

## 2024-10-24 NOTE — Progress Notes (Signed)
 " PROGRESS NOTE    Keith Espinoza  FMW:[id number] DOB: [birth year] DOA: 10/22/2024 PCP: Charlott Dorn LABOR, MD   Brief Narrative:  HPI: Keith Espinoza is a 84 y.o. male who is very frail, chronically ill and wheelchair-bound with coronary artery disease with stenting starting in 2004 to RCA, LAD, CX, last cardiac catheterization in July 2024 revealing diffuse coronary artery disease and was turndown for CABG presents here for follow-up of CAD, with medical history significant of hypertension, diabetes, hyperlipidemia, combined congestive diastolic and systolic congestive heart failure and multiple comorbidities as listed below presented to [facility] ED with a complaint of not feeling well, weakness and unable to eat or drink anything.  Per patient, for the last 2 days, he has not been able to keep anything down, he has been drinking only slight water.  Anytime he tries to eat anything solid, he vomits.  He also has been having abdominal pain for last 2 days.  The pain is located in the epigastric area and according to him, radiates to the periumbilical and then suprapubic area and he describes the pain as dull, 8 out of 10, aggravates with eating anything and has no relieving factor.  It waxes and wanes.  He denies any chest pain, shortness of breath, fever, chills, sweating, any problem with urination.  Tells me that the last time he urinated was 2 days ago and was little amount in the last time he had a bowel movement was also 2 days ago.  No sick contact or recent travel.   ED Course: Upon arrival to ED, patient was hemodynamically stable, dropped his blood pressure to 89/66 at 1 point in time.  Mild bradycardia which was also transient.  CKD at baseline but was found to have mild hyperkalemia along with metabolic acidosis.  Also had urinary retention, indwelling Foley catheter was placed.  CT abdomen pelvis unremarkable.  Lipase normal.  UA showed some bacteria and crystals, no nitrites, no  leukoesterase no white blood cells and does not have leukocytosis.  However started on Rocephin  for presumed UTI.  Hospitalist requested for admission.  Assessment & Plan:   Principal Problem:   UTI (urinary tract infection)  Abdominal pain/anorexia/nausea: Symptoms are improving.  No etiology was found on the CT abdomen and pelvis.  Only small to moderate ascites.  Patient denies any history of liver disease.  Patient afebrile without leukocytosis, doubt infectious etiology.  Treat symptomatically with supportive care, resume IV fluids for another 10 hours.  Will advance to soft diet now and then advance as tolerated to regular later today.  Discussed with the daughter and the nurse at the bedside.   Asymptomatic bacteriuria: Patient denies having any urinary complaints.  UA shows crystals and some bacteria but negative nitrites and leukoesterase.  No fever, no leukocytosis, CT abdomen pelvis negative for any inflammation in the bladder or hydronephrosis or pyelonephritis.  Patient has been started on Rocephin  empirically.  I do not believe patient has true UTI.  Will discontinue Rocephin .   High anion gap metabolic acidosis with CKD stage IV: Baseline creatinine around 2.5, patient's creatinine is at baseline however he presented with CO2 of 15 which has improved to 19 today after IV fluids.  Anion gap closed.  Metabolic acidosis improving.  Continue gentle IV hydration.  Repeat labs in the morning.   Hyperkalemia: 5.5 yesterday, status post Lokelma  but potassium higher at 5.6 today.  Will treat with dextrose  and insulin , calcium  gluconate, Lokelma , start on telemetry monitoring.  Repeat potassium at 5 PM today.     History of BPH now with acute urinary retention: Indwelling Foley catheter was placed in the ED.  Resuming terazosin .  Will mobilize.  Will plan to remove Foley catheter either later today or tomorrow morning.   Generalized weakness: Consulted PT OT.  Pending evaluation.   History of  gout: Resume allopurinol .   Chronic combined systolic and diastolic congestive heart failure: Patient appears dry..  Holding Lasix  for now due to metabolic acidosis but resuming Jardiance .  Continue IV fluids.  History of CAD and PAD: Patient at 1 point in time was on aspirin  and Plavix .  According to daughter, both of these medications were held at recent hospitalization at outside Hospital.  She does not know the reason.  I have resumed Plavix  for now.   Hyperlipidemia: Resume Crestor .   Type 2 diabetes mellitus: Last hemoglobin A1c 9.0 about a year ago.  PTA medications include Jardiance , NovoLog  FlexPen insulin  as needed and Tresiba  insulin  5 units daily.  Recheck hemoglobin A1c.  Blood sugar appears to be stable at the moment.  I am not sure how much he is going to eat so at this point in time, I am only going to cover him with SSI.   History of paroxysmal atrial fibrillation: Currently in sinus rhythm.  Does not appear to be on any rate control medications or anticoagulation, likely due to anemia and thrombocytopenia.   Anemia of chronic disease: Hemoglobin at baseline around 10, currently at baseline.  Monitor closely.  No signs of bleeding.  Denies any hematochezia or melena.   Chronic thrombocytopenia: Stable.  No signs of bleeding.  DVT prophylaxis: heparin  injection 5,000 Units Start: 10/23/24 1400   Espinoza Status: Limited: Do not attempt resuscitation (DNR) -DNR-LIMITED -Do Not Intubate/DNI   Family Communication: Daughter present at bedside.  Plan of care discussed with patient in length and he/she verbalized understanding and agreed with it.  Status is: Observation The patient will require care spanning > 2 midnights and should be moved to inpatient because: Hyperkalemia, advancing diet slowly.  Needs to be seen by PT OT.   Estimated body mass index is 22.46 kg/m as calculated from the following:   Height as of this encounter: 5' 9 (1.753 m).   Weight as of this encounter: 69  kg.  Wound 10/23/24 Pressure Injury Sacrum Stage 3 -  Full thickness tissue loss. Subcutaneous fat may be visible but bone, tendon or muscle are NOT exposed. (Active)   Nutritional Assessment: Body mass index is 22.46 kg/m.SABRA Seen by dietician.  I agree with the assessment and plan as outlined below: Nutrition Status:        . Skin Assessment: I have examined the patient's skin and I agree with the wound assessment as performed by the wound care RN as outlined below: Wound 10/23/24 Pressure Injury Sacrum Stage 3 -  Full thickness tissue loss. Subcutaneous fat may be visible but bone, tendon or muscle are NOT exposed. (Active)    Consultants:  None  Procedures:  None   Antimicrobials:  Anti-infectives (From admission, onward)    Start     Dose/Rate Route Frequency Ordered Stop   10/23/24 0800  cefTRIAXone  (ROCEPHIN ) 1 g in sodium chloride  0.9 % 100 mL IVPB        1 g 200 mL/hr over 30 Minutes Intravenous Every 24 hours 10/23/24 0721           Subjective: Patient seen and examined, he has no complaints.  Denied any abdominal pain.  Doing well and tolerating full liquid diet.  He is willing to advance to soft diet.  Plan of care discussed with him and the daughter.  Objective: Vitals:   10/23/24 1950 10/23/24 2300 10/24/24 0440 10/24/24 0728  BP: (!) 86/66 95/63 (!) 81/69 98/73  Pulse: 92 88 88 79  Resp: 18 18 18 16   Temp: 98 F (36.7 C) 98.7 F (37.1 C) 98.4 F (36.9 C) 97.8 F (36.6 C)  TempSrc: Oral Oral Oral Oral  SpO2: 97% 99% 100%   Weight:      Height:        Intake/Output Summary (Last 24 hours) at 10/24/2024 0737 Last data filed at 10/24/2024 0438 Gross per 24 hour  Intake 833.09 ml  Output 600 ml  Net 233.09 ml   Filed Weights   10/23/24 1021  Weight: 69 kg    Examination:  General exam: Appears calm and comfortable  Respiratory system: Clear to auscultation. Respiratory effort normal. Cardiovascular system: S1 & S2 heard, RRR. No JVD,  murmurs, rubs, gallops or clicks. No pedal edema. Gastrointestinal system: Abdomen is nondistended, soft and nontender. No organomegaly or masses felt. Normal bowel sounds heard. Central nervous system: Alert and oriented. No focal neurological deficits. Extremities: Symmetric 5 x 5 power. Skin: No rashes, lesions or ulcers Psychiatry: Judgement and insight appear normal. Mood & affect appropriate.    Data Reviewed: I have personally reviewed following labs and imaging studies  CBC: Recent Labs  Lab 10/22/24 2015 10/23/24 1032 10/24/24 0514  WBC 5.6 6.4 5.6  HGB 10.2* 9.6* 9.5*  HCT 33.0* 30.8* 29.9*  MCV 99.1 100.7* 98.7  PLT 75* 79* 75*   Basic Metabolic Panel: Recent Labs  Lab 10/22/24 2105 10/23/24 0417 10/23/24 1032 10/24/24 0514  NA 136 135 134* 129*  K 5.3* 5.4* 5.5* 5.6*  CL 102 99 100 97*  CO2 15* 20* 20* 19*  GLUCOSE 185* 179* 74 200*  BUN 38* 41* 42* 44*  CREATININE 2.24* 2.44* 2.48* 2.45*  CALCIUM  8.8* 9.6 9.3 9.1  PHOS  --   --  3.6  --    GFR: Estimated Creatinine Clearance: 21.9 mL/min (A) (by C-G formula based on SCr of 2.45 mg/dL (H)). Liver Function Tests: Recent Labs  Lab 10/22/24 2105 10/24/24 0514  AST 72* 34  ALT 60* 45*  ALKPHOS 126 124  BILITOT 0.8 0.7  PROT 5.7* 5.7*  ALBUMIN  3.6 3.3*   Recent Labs  Lab 10/22/24 2105  LIPASE 27   No results for input(s): AMMONIA in the last 168 hours. Coagulation Profile: No results for input(s): INR, PROTIME in the last 168 hours. Cardiac Enzymes: No results for input(s): CKTOTAL, CKMB, CKMBINDEX, TROPONINI in the last 168 hours. BNP (last 3 results) Recent Labs    05/14/24 1416 09/23/24 1117 10/05/24 1620  PROBNP 1,468.0* 20,065.0* 27,900.0*   HbA1C: Recent Labs    10/23/24 1032  HGBA1C 7.9*   CBG: Recent Labs  Lab 10/23/24 0746 10/23/24 1202 10/23/24 1610 10/23/24 2020  GLUCAP 123* 109* 113* 215*   Lipid Profile: No results for input(s): CHOL, HDL,  LDLCALC, TRIG, CHOLHDL, LDLDIRECT in the last 72 hours. Thyroid  Function Tests: Recent Labs    10/23/24 1032  TSH 3.030   Anemia Panel: No results for input(s): VITAMINB12, FOLATE, FERRITIN, TIBC, IRON , RETICCTPCT in the last 72 hours. Sepsis Labs: No results for input(s): PROCALCITON, LATICACIDVEN in the last 168 hours.  No results found for this or any previous visit (  from the past 240 hours).   Radiology Studies: CT ABDOMEN PELVIS WO CONTRAST Result Date: 10/22/2024 EXAM: CT ABDOMEN AND PELVIS WITHOUT CONTRAST 10/22/2024 09:48:46 PM TECHNIQUE: CT of the abdomen and pelvis was performed without the administration of intravenous contrast. Multiplanar reformatted images are provided for review. Automated exposure control, iterative reconstruction, and/or weight-based adjustment of the mA/kV was utilized to reduce the radiation dose to as low as reasonably achievable. COMPARISON: 09/23/2024 CLINICAL HISTORY: Abdominal pain, acute, nonlocalized. FINDINGS: LOWER CHEST: Small right and trace left pleural effusions, chronic. Mild patchy right lower lobe opacity, likely atelectasis. Severe 3 vessel coronary atherosclerosis. Hypodense blood pool relative to myocardium, suggesting anemia. LIVER: The liver is unremarkable. GALLBLADDER AND BILE DUCTS: Status post cholecystectomy. No biliary ductal dilatation. SPLEEN: No acute abnormality. PANCREAS: No acute abnormality. ADRENAL GLANDS: No acute abnormality. KIDNEYS, URETERS AND BLADDER: Multiple nonobstructing bilateral renal calculi measuring up to 5 mm, unchanged. No hydronephrosis. No perinephric or periureteral stranding. Urinary bladder is unremarkable. GI AND BOWEL: Suture line along the stomach. There is no bowel obstruction. PERITONEUM AND RETROPERITONEUM: Small to moderate abdominopelvic ascites, mildly progressive. No free air. VASCULATURE: Atherosclerotic calcifications of the abdominal aorta and branch vessels. LYMPH NODES:  No lymphadenopathy. REPRODUCTIVE ORGANS: Mild prostatomegaly. BONES AND SOFT TISSUES: Right hip arthroplasty. Degenerative changes of the visualized thoracolumbar spine. No focal soft tissue abnormality. IMPRESSION: 1. No acute abdominopelvic abnormality. 2. Small to moderate abdominopelvic ascites, mildly progressive. 3. Small right and trace left pleural effusions, unchanged. 4. Hypodense blood pool relative to myocardium, which can be seen with anemia. Electronically signed by: Pinkie Pebbles MD 10/22/2024 09:52 PM EST RP Workstation: [id number]    Scheduled Meds:  allopurinol   50 mg Oral QODAY   Chlorhexidine  Gluconate Cloth  6 each Topical Daily   clopidogrel   75 mg Oral Daily   collagenase   1 Application Topical Daily   insulin  aspart  10 Units Intravenous Once   And   dextrose   1 ampule Intravenous Once   empagliflozin   25 mg Oral Daily   heparin   5,000 Units Subcutaneous Q8H   insulin  aspart  0-15 Units Subcutaneous TID WC   insulin  aspart  0-5 Units Subcutaneous QHS   pantoprazole   40 mg Oral BID   rosuvastatin   10 mg Oral Daily   sodium chloride  flush  3 mL Intravenous Q12H   sodium zirconium cyclosilicate   10 g Oral Once   sucralfate   1 g Oral BID   terazosin   5 mg Oral BID   Continuous Infusions:  calcium  gluconate     cefTRIAXone  (ROCEPHIN )  IV 1 g (10/23/24 0750)     LOS: 0 days   Fredia Skeeter, MD [facility]  10/24/2024, 7:37 AM   *Please note that this is a verbal dictation therefore any spelling or grammatical errors are due to the Dragon Medical One system interpretation.  Please page via Amion and do not message via secure chat for urgent patient care matters. Secure chat can be used for non urgent patient care matters.  How to contact the [facility] Attending or Consulting provider 7A - 7P or covering provider during after hours 7P -7A, for this patient?  Check the care team in [facility] and look for a) attending/consulting [facility] provider listed and b) the [facility]  team listed. Page or secure chat 7A-7P. Log into [email or web address] and use [facility] universal password to access. If you do not have the password, please contact the hospital operator. Locate the [facility] provider you are looking for under [facility] and page to a number that you can be directly reached. If you still have difficulty reaching the provider, please page the [facility] (Director on Call) for the Hospitalists listed on amion for assistance.  "

## 2024-10-24 NOTE — Plan of Care (Signed)

## 2024-10-24 NOTE — Evaluation (Signed)
 Occupational Therapy Evaluation Patient Details Name: Keith Espinoza MRN: [id number] DOB: 18-Dec-1940 Today's Date: 10/24/2024   History of Present Illness   Pt is an 84 y.o. M  who presented 10/22/24 due to abdominal pain and weakness. Pt was admitted due to UTI. PMH CAD with stenting, HF, DM, R BKA, PAD, and vertigo.     Clinical Impressions Pt reported at PLOF they live alone but his daughter lives close by but works during the day. Pt reported he has needed increase in help due to weakness and mainly spoke about in the shower to make sure he does not fall but no physical assist. He reported he has been completing a lateral transfer to WC from bed, SPT into showers and sit to stand with peri care post toileting tasks. At this time pt declined transfers to chair as reporting you are going to leave me there and even after re assuring pt we could transfer back to the bed he still decline. He then agreed to complete one sit to stand transfer with mod assist while holding onto walker and with bed elevated for about 5 seconds then reported he needed to sit. He then completed 1 lateral slide with CGA at EOB. Pt vocalized he has a housekeeper that comes in and can assist more for him if needed with the return to home as this therapist was concern about level of assist needed. Then also further attempt to discuss DME and how he was going to complete ADLS with the return to home and expressed he could buy anything, money is not the problem. At this time recommendation for [facility] services and Acute Occupational Therapy to follow.      If plan is discharge home, recommend the following:   A lot of help with walking and/or transfers;A lot of help with bathing/dressing/bathroom;Assistance with cooking/housework;Assist for transportation;Help with stairs or ramp for entrance     Functional Status Assessment   Patient has had a recent decline in their functional status and demonstrates the ability to  make significant improvements in function in a reasonable and predictable amount of time.     Equipment Recommendations   None recommended by OT (as pt reported they can get it even with recomendation for drop arm commode)     Recommendations for Other Services         Precautions/Restrictions   Precautions Precautions: Fall     Mobility Bed Mobility               General bed mobility comments: pt presented at EOB and decline any further mobility    Transfers Overall transfer level: Needs assistance Equipment used: Rolling walker (2 wheels) Transfers: Sit to/from Stand Sit to Stand: Mod assist, From elevated surface           General transfer comment: Pt reported they were not going to do any transfers to chair at this time as her reported you are going to leave me there I re assured the pt we could transfer back to the bed but then still declined. He then agreed to complete one sit to stand to RW with mod assist while stabilizing walker and bed was elevated. He stood about 5 seconds then reported he needed to sit down as his leg was going to give out      Balance Overall balance assessment: Needs assistance Sitting-balance support: Feet supported Sitting balance-Leahy Scale: Good     Standing balance support: Bilateral upper extremity supported Standing balance-Leahy Scale: Poor  ADL either performed or assessed with clinical judgement   ADL Overall ADL's : Needs assistance/impaired Eating/Feeding: Set up;Sitting   Grooming: Set up;Sitting   Upper Body Bathing: Set up;Sitting   Lower Body Bathing: Minimal assistance;Moderate assistance;Sitting/lateral leans   Upper Body Dressing : Set up;Sitting   Lower Body Dressing: Minimal assistance;Moderate assistance;Sit to/from stand;Sitting/lateral leans                       Vision Baseline Vision/History: 1 Wears glasses Vision Assessment?:  Wears glasses for reading;Wears glasses for driving Additional Comments: Pt reported they can not read the Menu as they have macular degenernation     Perception         Praxis         Pertinent Vitals/Pain Pain Assessment Pain Assessment: Faces Faces Pain Scale: Hurts a little bit Pain Location: [place] with lateral transfer Pain Descriptors / Indicators: Discomfort Pain Intervention(s): Limited activity within patient's tolerance, Monitored during session     Extremity/Trunk Assessment Upper Extremity Assessment Upper Extremity Assessment: Generalized weakness   Lower Extremity Assessment Lower Extremity Assessment: Defer to PT evaluation   Cervical / Trunk Assessment Cervical / Trunk Assessment: Kyphotic   Communication Communication Communication: Impaired Factors Affecting Communication: Hearing impaired   Cognition Arousal: Alert Behavior During Therapy: WFL for tasks assessed/performed Cognition: Difficult to assess             OT - Cognition Comments: Some of pt's reasoning on how to complete tasks were questionable                 Following commands: Intact       Cueing  General Comments   Cueing Techniques: Verbal cues      Exercises     Shoulder Instructions      Home Living Family/patient expects to be discharged to:: Private residence Living Arrangements: Alone Available Help at Discharge: Family;Available PRN/intermittently Type of Home: House Home Access: Level entry           Bathroom Shower/Tub: Walk-in shower   Bathroom Toilet: Handicapped height Bathroom Accessibility: Yes How Accessible: Accessible via wheelchair Home Equipment: Rolling Walker (2 wheels);BSC/3in1;Shower seat;Cane - single point;Wheelchair - manual;Wheelchair - power;Hand held shower head;Grab bars - tub/shower;Grab bars - toilet;Adaptive equipment;Lift chair;Other (comment) Adaptive Equipment: Reacher;Sock aid;Long-handled shoe horn;Long-handled  sponge Additional Comments: Pt reports in session if I do not have the equipment I can buy it, money is not a problem      Prior Functioning/Environment Prior Level of Function : Independent/Modified Independent             Mobility Comments: Pt reported recently he was doing a leteral scoot to [facility] but then reported sit to stand from toilet, stand pivot into shower ADLs Comments: Pt functions at [facility] level with ADLS and has had some increase in assist with showers from daughter due to weakness    OT Problem List: Decreased strength;Decreased activity tolerance;Decreased safety awareness;Decreased knowledge of use of DME or AE   OT Treatment/Interventions: Self-care/ADL training;Therapeutic exercise;DME and/or AE instruction;Therapeutic activities;Patient/family education;Balance training      OT Goals(Current goals can be found in the care plan section)   Acute Rehab OT Goals Patient Stated Goal: to eat OT Goal Formulation: With patient Time For Goal Achievement: 11/07/24 Potential to Achieve Goals: Fair   OT Frequency:  Min 2X/week    Co-evaluation              AM-PAC OT 6 Clicks Daily  Activity     Outcome Measure Help from another person eating meals?: None Help from another person taking care of personal grooming?: A Little Help from another person toileting, which includes using toliet, bedpan, or urinal?: A Lot Help from another person bathing (including washing, rinsing, drying)?: A Lot Help from another person to put on and taking off regular upper body clothing?: A Little Help from another person to put on and taking off regular lower body clothing?: A Lot 6 Click Score: 16   End of Session Equipment Utilized During Treatment: Gait belt;Rolling walker (2 wheels) Nurse Communication: Mobility status  Activity Tolerance: Treatment limited secondary to agitation Patient left: in bed;with call bell/phone within reach (staff came into room with EKG)  OT  Visit Diagnosis: Unsteadiness on feet (R26.81);Other abnormalities of gait and mobility (R26.89);Muscle weakness (generalized) (M62.81)                Time: 9264-9194 OT Time Calculation (min): 30 min Charges:  OT General Charges $OT Visit: 1 Visit OT Evaluation $OT Eval Low Complexity: 1 Low OT Treatments $Self Care/Home Management : 8-22 mins  Warrick POUR OTR/L  Acute Rehab Services  [phone] office number   Warrick Berber 10/24/2024, 8:20 AM

## 2024-10-24 NOTE — Care Management Obs Status (Signed)
 MEDICARE OBSERVATION STATUS NOTIFICATION   Patient Details  Name: Keith Espinoza MRN: [id number] Date of Birth: Oct 19, 1940   Medicare Observation Status Notification Given:  Yes  Obs signed and a copy given.   Kenetra Hildenbrand 10/24/2024, 2:39 PM

## 2024-10-25 ENCOUNTER — Telehealth: Payer: Self-pay | Admitting: Dietician

## 2024-10-25 DIAGNOSIS — Z7189 Other specified counseling: Secondary | ICD-10-CM | POA: Diagnosis not present

## 2024-10-25 DIAGNOSIS — E875 Hyperkalemia: Secondary | ICD-10-CM | POA: Diagnosis not present

## 2024-10-25 DIAGNOSIS — R109 Unspecified abdominal pain: Secondary | ICD-10-CM

## 2024-10-25 DIAGNOSIS — Z515 Encounter for palliative care: Secondary | ICD-10-CM

## 2024-10-25 DIAGNOSIS — N390 Urinary tract infection, site not specified: Secondary | ICD-10-CM | POA: Diagnosis not present

## 2024-10-25 DIAGNOSIS — Z66 Do not resuscitate: Secondary | ICD-10-CM

## 2024-10-25 LAB — LACTIC ACID, PLASMA
Lactic Acid, Venous: 2.4 mmol/L (ref 0.5–1.9)
Lactic Acid, Venous: 4.6 mmol/L (ref 0.5–1.9)

## 2024-10-25 LAB — COMPREHENSIVE METABOLIC PANEL WITH GFR
ALT: 49 U/L — ABNORMAL HIGH (ref 0–44)
AST: 44 U/L — ABNORMAL HIGH (ref 15–41)
Albumin: 3.4 g/dL — ABNORMAL LOW (ref 3.5–5.0)
Alkaline Phosphatase: 135 U/L — ABNORMAL HIGH (ref 38–126)
Anion gap: 13 (ref 5–15)
BUN: 41 mg/dL — ABNORMAL HIGH (ref 8–23)
CO2: 18 mmol/L — ABNORMAL LOW (ref 22–32)
Calcium: 9.1 mg/dL (ref 8.9–10.3)
Chloride: 98 mmol/L (ref 98–111)
Creatinine, Ser: 2.36 mg/dL — ABNORMAL HIGH (ref 0.61–1.24)
GFR, Estimated: 26 mL/min — ABNORMAL LOW
Glucose, Bld: 152 mg/dL — ABNORMAL HIGH (ref 70–99)
Potassium: 5.2 mmol/L — ABNORMAL HIGH (ref 3.5–5.1)
Sodium: 128 mmol/L — ABNORMAL LOW (ref 135–145)
Total Bilirubin: 0.8 mg/dL (ref 0.0–1.2)
Total Protein: 5.8 g/dL — ABNORMAL LOW (ref 6.5–8.1)

## 2024-10-25 LAB — PRO BRAIN NATRIURETIC PEPTIDE: Pro Brain Natriuretic Peptide: 24629 pg/mL — ABNORMAL HIGH

## 2024-10-25 LAB — CBC WITH DIFFERENTIAL/PLATELET
Abs Immature Granulocytes: 0.01 K/uL (ref 0.00–0.07)
Basophils Absolute: 0 K/uL (ref 0.0–0.1)
Basophils Relative: 0 %
Eosinophils Absolute: 0.2 K/uL (ref 0.0–0.5)
Eosinophils Relative: 5 %
HCT: 29.6 % — ABNORMAL LOW (ref 39.0–52.0)
Hemoglobin: 9.4 g/dL — ABNORMAL LOW (ref 13.0–17.0)
Immature Granulocytes: 0 %
Lymphocytes Relative: 17 %
Lymphs Abs: 0.8 K/uL (ref 0.7–4.0)
MCH: 31.6 pg (ref 26.0–34.0)
MCHC: 31.8 g/dL (ref 30.0–36.0)
MCV: 99.7 fL (ref 80.0–100.0)
Monocytes Absolute: 0.5 K/uL (ref 0.1–1.0)
Monocytes Relative: 9 %
Neutro Abs: 3.4 K/uL (ref 1.7–7.7)
Neutrophils Relative %: 69 %
Platelets: 75 K/uL — ABNORMAL LOW (ref 150–400)
RBC: 2.97 MIL/uL — ABNORMAL LOW (ref 4.22–5.81)
RDW: 17.2 % — ABNORMAL HIGH (ref 11.5–15.5)
Smear Review: NORMAL
WBC: 4.9 K/uL (ref 4.0–10.5)
nRBC: 0 % (ref 0.0–0.2)

## 2024-10-25 LAB — GLUCOSE, CAPILLARY
Glucose-Capillary: 280 mg/dL — ABNORMAL HIGH (ref 70–99)
Glucose-Capillary: 170 mg/dL — ABNORMAL HIGH (ref 70–99)
Glucose-Capillary: 109 mg/dL — ABNORMAL HIGH (ref 70–99)
Glucose-Capillary: 194 mg/dL — ABNORMAL HIGH (ref 70–99)

## 2024-10-25 LAB — POTASSIUM: Potassium: 5.3 mmol/L — ABNORMAL HIGH (ref 3.5–5.1)

## 2024-10-25 MED ORDER — LACTATED RINGERS IV BOLUS
1000.0000 mL | Freq: Once | INTRAVENOUS | Status: AC
  Administered 2024-10-25: 1000 mL via INTRAVENOUS

## 2024-10-25 MED ORDER — SODIUM ZIRCONIUM CYCLOSILICATE 10 G PO PACK
10.0000 g | PACK | Freq: Once | ORAL | Status: DC
  Filled 2024-10-25: qty 1

## 2024-10-25 MED ORDER — ALBUMIN HUMAN 25 % IV SOLN
50.0000 g | Freq: Once | INTRAVENOUS | Status: AC
  Administered 2024-10-25: 50 g via INTRAVENOUS
  Filled 2024-10-25: qty 200

## 2024-10-25 MED ORDER — SODIUM ZIRCONIUM CYCLOSILICATE 10 G PO PACK
10.0000 g | PACK | Freq: Once | ORAL | Status: AC
  Administered 2024-10-25: 10 g via ORAL
  Filled 2024-10-25: qty 1

## 2024-10-25 NOTE — Progress Notes (Signed)
 CRITICAL VALUE STICKER  CRITICAL VALUE: lactic acid 2.4  RECEIVER (on-site recipient of call): Ileana NOVAK, RN  DATE & TIME NOTIFIED: 1/22 11:35  MD NOTIFIED: Vernon Ranks, MD  RESPONSE: New orders placed

## 2024-10-25 NOTE — Progress Notes (Signed)
 Occupational Therapy Treatment Patient Details Name: Keith Espinoza MRN: [id number] DOB: [birth year] Today's Date: 10/25/2024   History of present illness 84 y.o. male admitted 10/22/24 with c/o weakness, nausea/vomiting, abdominal pain. Workup for urinary retention, hypotension, presumed UTI; pt was preparing for d/c but too weak to get into wheelchair. Wound care consulted for wounds to sacrum, L foot. PMH includes R BKA (2022), CAD, CHF, DM, HTN, vertigo.   OT comments  Patient received in supine and agreeable to OT treatment. Patient able to get to EOB with supervision and able to perform grooming tasks with supervision and declined further self care tasks. Patient declined OOB or attempting to stand stating, I can't stand, I'm too weak.  Patient able to perform BUE HEP with level one therapy band to increase functional strength to assist with transfers. Patient able to perform lateral scoots to right with supervision before returning to supine. Discharge recommendations continue to be appropriate. Acute OT to continue to follow to address established goals.       If plan is discharge home, recommend the following:  A lot of help with walking and/or transfers;A lot of help with bathing/dressing/bathroom;Assistance with cooking/housework;Assist for transportation;Help with stairs or ramp for entrance   Equipment Recommendations  None recommended by OT    Recommendations for Other Services      Precautions / Restrictions Precautions Precautions: Fall Recall of Precautions/Restrictions: Intact Precaution/Restrictions Comments: h/o R BKA (prosthetic in room, refused to wear 10/25/24) Restrictions Weight Bearing Restrictions Per Provider Order: No       Mobility Bed Mobility Overal bed mobility: Needs Assistance Bed Mobility: Supine to Sit, Sit to Supine     Supine to sit: Supervision, HOB elevated Sit to supine: Supervision   General bed mobility comments: able to perform  without physical assistance    Transfers Overall transfer level: Needs assistance Equipment used: None Transfers: Bed to chair/wheelchair/BSC             General transfer comment: declined OOB or standing, I can't stand  Performed lateral scooting towards HOB, right, with supervision     Balance Overall balance assessment: Needs assistance Sitting-balance support: Feet supported, No upper extremity supported Sitting balance-Leahy Scale: Good Sitting balance - Comments: no LOB seated on EOB                                   ADL either performed or assessed with clinical judgement   ADL Overall ADL's : Needs assistance/impaired     Grooming: Set up;Sitting Grooming Details (indicate cue type and reason): on EOB                               General ADL Comments: performed grooming seated on EOB declined all other self care tasks    Extremity/Trunk Assessment              Vision       Perception     Praxis     Communication Communication Communication: Impaired Factors Affecting Communication: Hearing impaired   Cognition Arousal: Alert Behavior During Therapy: WFL for tasks assessed/performed Cognition: Difficult to assess             OT - Cognition Comments: able to speak of why he is in hospital and what he has been able to perform at home  Following commands: Intact        Cueing   Cueing Techniques: Verbal cues  Exercises Exercises: General Upper Extremity General Exercises - Upper Extremity Shoulder Horizontal ABduction: Strengthening, Both, 10 reps, Seated, Theraband Theraband Level (Shoulder Horizontal Abduction): Level 1 (Yellow) Elbow Flexion: Strengthening, Both, 10 reps, Seated, Theraband Theraband Level (Elbow Flexion): Level 1 (Yellow) Elbow Extension: Strengthening, Both, 10 reps, Seated, Theraband Theraband Level (Elbow Extension): Level 1 (Yellow)    Shoulder Instructions        General Comments VSS on RA    Pertinent Vitals/ Pain       Pain Assessment Pain Assessment: Faces Faces Pain Scale: Hurts a little bit Pain Location: generalized Pain Descriptors / Indicators: Discomfort, Grimacing Pain Intervention(s): Limited activity within patient's tolerance, Monitored during session, Repositioned  Home Living                                          Prior Functioning/Environment              Frequency  Min 2X/week        Progress Toward Goals  OT Goals(current goals can now be found in the care plan section)  Progress towards OT goals: Progressing toward goals  Acute Rehab OT Goals Patient Stated Goal: to go home OT Goal Formulation: With patient Time For Goal Achievement: 11/07/24 Potential to Achieve Goals: Fair ADL Goals Pt Will Perform Lower Body Bathing: with modified independence;sit to/from stand;sitting/lateral leans Pt Will Perform Lower Body Dressing: with modified independence;with adaptive equipment;sitting/lateral leans;sit to/from stand Additional ADL Goal #1: Pt will complete lateral transfer to chair/WC to simulate to home  Plan      Co-evaluation                 AM-PAC OT 6 Clicks Daily Activity     Outcome Measure   Help from another person eating meals?: None Help from another person taking care of personal grooming?: A Little Help from another person toileting, which includes using toliet, bedpan, or urinal?: A Lot Help from another person bathing (including washing, rinsing, drying)?: A Lot Help from another person to put on and taking off regular upper body clothing?: A Little Help from another person to put on and taking off regular lower body clothing?: A Lot 6 Click Score: 16    End of Session    OT Visit Diagnosis: Unsteadiness on feet (R26.81);Other abnormalities of gait and mobility (R26.89);Muscle weakness (generalized) (M62.81)   Activity Tolerance Patient tolerated  treatment well   Patient Left in bed;with call bell/phone within reach;with bed alarm set   Nurse Communication Mobility status        Time: 8962-8892 OT Time Calculation (min): 30 min  Charges: OT General Charges $OT Visit: 1 Visit OT Treatments $Self Care/Home Management : 8-22 mins $Therapeutic Exercise: 8-22 mins  Dick Laine, OTA Acute Rehabilitation Services  Office [phone]   Jeb LITTIE Laine 10/25/2024, 1:56 PM

## 2024-10-25 NOTE — TOC Initial Note (Signed)
 Transition of Care [facility]) - Initial/Assessment Note    Patient Details  Name: Keith Espinoza MRN: [id number] Date of Birth: 11/13/1940  [facility] of [facility]) CM/SW Contact:    Roxie KANDICE Stain, RN Phone Number: 10/25/2024, 9:03 AM  Clinical Narrative:                  Spoke to patient at bedside regarding transition needs. Patient is active with [facility] for PT OT, Art[place]  notified of admission. Patient lives by himself and has all needed DME. Patient's daughter helps with transportation to appointments. Address, Phone number and PCP verified.  Need home health PT OT resumption orders.  Expected Discharge Plan: Home w Home Health Services Barriers to Discharge: Continued Medical Work up   Patient Goals and CMS Choice Patient states their goals for this hospitalization and ongoing recovery are:: Get better CMS Medicare.gov Compare Post Acute Care list provided to:: Patient Choice offered to / list presented to : Patient      Expected Discharge Plan and Services   Discharge Planning Services: CM Consult   Living arrangements for the past 2 months: Single Family Home                           HH Arranged: PT, OT HH Agency: [facility] Date HH Agency Contacted: 10/25/24 Time HH Agency Contacted: [phone] Representative spoke with at [facility] Agency: [place]  Prior Living Arrangements/Services Living arrangements for the past 2 months: Single Family Home Lives with:: Self Patient language and need for interpreter reviewed:: Yes Do you feel safe going back to the place where you live?: Yes      Need for Family Participation in Patient Care: Yes (Comment) Care giver support system in place?: Yes (comment)   Criminal Activity/Legal Involvement Pertinent to Current Situation/Hospitalization: No - Comment as needed  Activities of Daily Living   ADL Screening (condition at time of admission) Independently performs ADLs?: Yes (appropriate for  developmental age) Is the patient deaf or have difficulty hearing?: No Does the patient have difficulty seeing, even when wearing glasses/contacts?: No Does the patient have difficulty concentrating, remembering, or making decisions?: No  Permission Sought/Granted         Permission granted to share info w AGENCY: Home health        Emotional Assessment Appearance:: Appears stated age Attitude/Demeanor/Rapport: Engaged Affect (typically observed): Accepting Orientation: : Oriented to Self, Oriented to Place, Oriented to  Time, Oriented to Situation Alcohol / Substance Use: Not Applicable Psych Involvement: No (comment)  Admission diagnosis:  UTI (urinary tract infection) [N39.0] Abdominal pain, unspecified abdominal location [R10.9] Chronic kidney disease, unspecified CKD stage [N18.9] Patient Active Problem List   Diagnosis Date Noted   UTI (urinary tract infection) 10/23/2024   HFrEF (heart failure with reduced ejection fraction) (HCC) 09/23/2024   Dysphagia 09/23/2024   Elevated liver enzymes 09/23/2024   Abdominal pain 09/15/2024   Lung nodules 05/15/2024   CKD stage 3a, GFR 45-59 ml/min (HCC) 05/15/2024   Chronic heart failure with mildly reduced ejection fraction (HFmrEF, 41-49%) (HCC) 03/02/2024   Gastric AVM 07/08/2023   Diverticulosis of colon without hemorrhage 07/08/2023   PSVT (paroxysmal supraventricular tachycardia) 07/08/2023   Paroxysmal A-fib (HCC) 07/08/2023   Grade I diastolic dysfunction 07/08/2023   Hypotension 07/07/2023   Ischemic cardiomyopathy 07/07/2023   Iron  deficiency anemia 07/05/2023   Finger numbness 05/05/2023   Unstable angina (HCC) 04/23/2023   Acute kidney injury superimposed  on chronic kidney disease 04/23/2023   History of CAD (coronary artery disease) 04/23/2023   Type 2 diabetes mellitus with hyperglycemia, with long-term current use of insulin  (HCC) 02/15/2022   Adenoma of left adrenal gland 02/15/2022   History of right below  knee amputation (HCC) 10/06/2021   Exudative age-related macular degeneration of left eye with active choroidal neovascularization (HCC) 07/28/2021   Exudative age-related macular degeneration of right eye with active choroidal neovascularization (HCC) 06/01/2021   Intermediate stage nonexudative age-related macular degeneration of both eyes 06/01/2021   Type 2 diabetes mellitus with diabetic polyneuropathy, with long-term current use of insulin  (HCC) 03/24/2021   Constipation 02/02/2021   Type II diabetes mellitus, uncontrolled 01/06/2021   Acute pancreatitis 01/06/2021   Diabetic peripheral vascular disease (HCC) 01/06/2021   Enlarged prostate 01/06/2021   Gout 01/06/2021   Headache 01/06/2021   Loss of appetite 01/06/2021   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   Peripheral neuropathy 01/06/2021   Sciatica 01/06/2021   Vitamin B12 deficiency 01/06/2021   Vitamin D  deficiency 01/06/2021   Weakness 01/06/2021   Diabetes mellitus type 2 with neurological manifestations (HCC) 01/06/2021   Protein-calorie malnutrition, severe 12/26/2020   Diabetic neuropathy (HCC) 11/17/2020   Hyperglycemia due to type 2 diabetes mellitus (HCC) 11/17/2020   Long term (current) use of insulin  (HCC) 11/17/2020   Other intervertebral disc degeneration, lumbar region 03/30/2019   Coronary artery disease 01/30/2019   Tobacco abuse 01/30/2019   Hyperlipidemia 09/04/2014   Thrombocytopenia    Coronary atherosclerosis of native coronary artery 10/01/2013   Other and unspecified hyperlipidemia 10/01/2013   Essential hypertension 10/01/2013   Type 2 diabetes mellitus with complication, without long-term current use of insulin  (HCC) 10/01/2013   Esophageal reflux 10/01/2013   Hypertrophy of prostate without urinary obstruction and other lower urinary tract symptoms (LUTS) 10/01/2013   PCP:  Charlott Dorn LABOR, MD Pharmacy:   [facility] [phone] - [place], [place] - [address] [place]  [place] [address] Phone: [phone] Fax: [phone]  [email or web address] - [place], [place] - [phone] [address] [facility] [address] [place] [address] Phone: [phone] Fax: [phone]  [facility] [address] [place] [address] Phone: [phone] Fax: [phone]     Social Drivers of Health (SDOH) Social History: SDOH Screenings   Food Insecurity: No Food Insecurity (10/23/2024)  Housing: Low Risk (10/23/2024)  Transportation Needs: No Transportation Needs (10/23/2024)  Utilities: Not At Risk (10/23/2024)  Alcohol Screen: Low Risk (09/24/2024)  Depression (PHQ2-9): Low Risk (04/21/2022)  Financial Resource Strain: Low Risk (09/24/2024)  Social Connections: Socially Isolated (10/23/2024)  Stress: No Stress Concern Present (06/12/2023)   Received from [facility]  Tobacco Use: High Risk (10/22/2024)   SDOH Interventions: Social Connections Interventions: [facility] Provided, Inpatient TOC   Readmission Risk Interventions    09/25/2024   10:24 AM  Readmission Risk Prevention Plan  Transportation Screening Complete  HRI or Home Care Consult Complete  Social Work Consult for Recovery Care Planning/Counseling Complete  Palliative Care Screening Not Applicable  Medication Review Oceanographer) Referral to Pharmacy

## 2024-10-25 NOTE — Inpatient Diabetes Management (Signed)
 Inpatient Diabetes Program Recommendations  AACE/ADA: New Consensus Statement on Inpatient Glycemic Control (2015)  Target Ranges:  Prepandial:   less than 140 mg/dL      Peak postprandial:   less than 180 mg/dL (1-2 hours)      Critically ill patients:  140 - 180 mg/dL   Lab Results  Component Value Date   GLUCAP 280 (H) 10/25/2024   HGBA1C 7.9 (H) 10/23/2024    Review of Glycemic Control  Latest Reference Range & Units 10/24/24 09:03 10/24/24 11:45 10/24/24 17:11 10/24/24 20:41 10/25/24 07:28  Glucose-Capillary 70 - 99 mg/dL 742 (H) 753 (H) 840 (H) 87 280 (H)  (H): Data is abnormally high  Diabetes history: DM2   Outpatient Diabetes medications:  Tresiba  5 units every day Novolog  Correction Jardiance  25 mg every day Dexcom G7   Current orders for Inpatient glycemic control:  Novolog  0-15 units TID and 0-5 units at bedtime Jardiance  25 mg QD   Inpatient Diabetes Program Recommendations:     Semglee  5 units every day   Thank you, Wyvonna Pinal, MSN, CDCES Diabetes Coordinator Inpatient Diabetes Program [phone] (team pager from 8a-5p)

## 2024-10-25 NOTE — Progress Notes (Addendum)
 " PROGRESS NOTE    Keith Espinoza  FMW:[id number] DOB: Mar 14, 1941 DOA: 10/22/2024 PCP: Charlott Dorn LABOR, MD   Brief Narrative:  HPI: Keith Espinoza is a 84 y.o. 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  Per patient, for the last 2 days, he has not been able to keep anything down, he has been drinking only slight water.  Anytime he tries to eat anything solid, he vomits.  He also has been having abdominal pain for last 2 days.  The pain is located in the epigastric area and according to him, radiates to the periumbilical and then suprapubic area and he describes the pain as dull, 8 out of 10, aggravates with eating anything and has no relieving factor.  It waxes and wanes.  He denies any chest pain, shortness of breath, fever, chills, sweating, any problem with urination.  Tells me that the last time he urinated was 2 days ago and was little amount in the last time he had a bowel movement was also 2 days ago.  No sick contact or recent travel.   ED Course: Upon arrival to ED, patient was hemodynamically stable, dropped his blood pressure to 89/66 at 1 point in time.  Mild bradycardia which was also transient.  CKD at baseline but was found to have mild hyperkalemia along with metabolic acidosis.  Also had urinary retention, indwelling Foley catheter was placed.  CT abdomen pelvis unremarkable.  Lipase normal.  UA showed some bacteria and crystals, no nitrites, no  leukoesterase no white blood cells and does not have leukocytosis.  However started on Rocephin  for presumed UTI.  Hospitalist requested for admission.  Assessment & Plan:   Principal Problem:   UTI (urinary tract infection)  Abdominal pain/anorexia/nausea: Symptoms are improving.  No etiology was found on the CT abdomen and pelvis.  Only small to moderate ascites.  Patient denies any history of liver disease.  Patient afebrile without leukocytosis, doubt infectious etiology.  Treat symptomatically with supportive care.  Tolerating regular diet.   Asymptomatic bacteriuria: Patient denies having any urinary complaints.  UA shows crystals and some bacteria but negative nitrites and leukoesterase.  No fever, no leukocytosis, CT abdomen pelvis negative for any inflammation in the bladder or hydronephrosis or pyelonephritis.  Patient has been started on Rocephin  empirically.  I do not believe patient has true UTI.  Discontinued Rocephin .   High anion gap metabolic acidosis with CKD stage IV: Baseline creatinine around 2.5, patient's creatinine is at baseline however he presented with CO2 of 15 which improved to 20 but dropped to 18 again today.  Will consider starting on gentle hydration if BNP is not elevated.  Hypotension: Blood pressure 81/54 this morning but patient remains asymptomatic.  Checking lactic acid. Addendum: Has lactic acidosis.  Will provide 1 L of LR bolus and repeat lactic acid at 5 PM, after completion of the bolus.   Hyperkalemia: Improved but is still elevated at 5.2.  Repeat a dose of Lokelma , monitor closely, recheck later today.   History of BPH now  with acute urinary retention: Indwelling Foley catheter was placed in the ED. zoomed terazosin .  Still with low urine output.  Will plan to remove Foley catheter either later today or tomorrow morning.   Generalized weakness: Consulted PT OT.  Per them, he did not want to be seen by them and he refused SNF.  Home health PT OT has  been recommended.   History of gout: Resumed allopurinol .   Chronic combined systolic and diastolic congestive heart failure: Patient appears dry..  Holding Lasix  for now due to metabolic acidosis but resuming Jardiance .   History of CAD and PAD: Patient at 1 point in time was on aspirin  and Plavix .  According to daughter, both of these medications were held at recent hospitalization at outside Hospital.  She does not know the reason.  I have resumed Plavix  for now.   Hyperlipidemia: Resume Crestor .   Type 2 diabetes mellitus: Last hemoglobin A1c 9.0 about a year ago.  PTA medications include Jardiance , NovoLog  FlexPen insulin  as needed and Tresiba  insulin  5 units daily.  Hemoglobin A1c 7.9..  Blood sugar labile.  Continue SSI.   History of paroxysmal atrial fibrillation: Currently in sinus rhythm.  Does not appear to be on any rate control medications or anticoagulation, likely due to anemia and thrombocytopenia.   Anemia of chronic disease: Hemoglobin at baseline around 10, currently at baseline.  Monitor closely.  No signs of bleeding.  Denies any hematochezia or melena.   Chronic thrombocytopenia: Stable.  No signs of bleeding.  Goal of care: Patient expressed his desire to not pursue aggressive care for him.  Palliative care has been consulted per his request to discuss goal of care.  DVT prophylaxis: heparin  injection 5,000 Units Start: 10/23/24 1400   Code Status: Limited: Do not attempt resuscitation (DNR) -DNR-LIMITED -Do Not Intubate/DNI   Family Communication: None present at bedside.  Plan of care discussed with patient in length and he/she verbalized understanding and agreed with it.  Status is: Inpatient Remains inpatient appropriate because: Hypotensive, hyperkalemic and metabolic acidosis.    Estimated body mass index is 22.46 kg/m as calculated from the following:   Height as of this encounter: 5' 9 (1.753 m).   Weight as of this encounter: 69 kg.  Wound 10/23/24  Pressure Injury Sacrum Stage 3 -  Full thickness tissue loss. Subcutaneous fat may be visible but bone, tendon or muscle are NOT exposed. (Active)   Nutritional Assessment: Body mass index is 22.46 kg/m.SABRA Seen by dietician.  I agree with the assessment and plan as outlined below: Nutrition Status:        . Skin Assessment: I have examined the patient's skin and I agree with the wound assessment as performed by the wound care RN as outlined below: Wound 10/23/24 Pressure Injury Sacrum Stage 3 -  Full thickness tissue loss. Subcutaneous fat may be visible but bone, tendon or muscle are NOT exposed. (Active)    Consultants:  None  Procedures:  None   Antimicrobials:  Anti-infectives (From admission, onward)    Start     Dose/Rate Route Frequency Ordered Stop   10/23/24 0800  cefTRIAXone  (ROCEPHIN ) 1 g in sodium chloride  0.9 % 100 mL IVPB  Status:  Discontinued        1 g 200 mL/hr over 30 Minutes Intravenous Every 24 hours 10/23/24 0721 10/24/24 0738         Subjective: Patient seen and examined.  No family member was present at bedside.  He has no complaints.  Denied any abdominal pain.  Tolerating regular diet without any nausea.  I discussed with him about his conversation with PT OT.  He does not recall telling them that he did not want to participate with them.  He did confirm that he does not want to go to SNF even if recommended.  He tells me that one of his daughter lives upstairs and another daughter lives next-door so he has good help.  Initially he refused lab draws but after my counseling, he agreed to do that.  Objective: Vitals:   10/24/24 1950 10/24/24 2349 10/25/24 0400 10/25/24 0722  BP: 100/73 99/73 91/64  (!) 81/54  Pulse:  96 96 93  Resp: 18 18 18 16   Temp: 97.8 F (36.6 C) 97.9 F (36.6 C) 98 F (36.7 C) 97.6 F (36.4 C)  TempSrc: Oral Oral Oral Oral  SpO2: 94% 98% 99% 100%  Weight:      Height:        Intake/Output Summary (Last 24 hours) at  10/25/2024 0744 Last data filed at 10/24/2024 1908 Gross per 24 hour  Intake 658.86 ml  Output 350 ml  Net 308.86 ml   Filed Weights   10/23/24 1021  Weight: 69 kg    Examination:  General exam: Appears calm and comfortable  Respiratory system: Clear to auscultation. Respiratory effort normal. Cardiovascular system: S1 & S2 heard, RRR. No JVD, murmurs, rubs, gallops or clicks.  +2 pitting edema left lower extremity, right BKA. Gastrointestinal system: Abdomen is nondistended, soft and nontender. No organomegaly or masses felt. Normal bowel sounds heard. Central nervous system: Alert and oriented. No focal neurological deficits. Skin: No rashes, lesions or ulcers.  Psychiatry: Judgement and insight appear normal. Mood & affect appropriate.   Data Reviewed: I have personally reviewed following labs and imaging studies  CBC: Recent Labs  Lab 10/22/24 2015 10/23/24 1032 10/24/24 0514 10/25/24 0440  WBC 5.6 6.4 5.6 4.9  NEUTROABS  --   --   --  3.4  HGB 10.2* 9.6* 9.5* 9.4*  HCT 33.0* 30.8* 29.9* 29.6*  MCV 99.1 100.7* 98.7 99.7  PLT 75* 79* 75* 75*   Basic Metabolic Panel: Recent Labs  Lab 10/22/24 2105 10/23/24 0417 10/23/24 1032 10/24/24 0514 10/24/24 1839 10/25/24 0440  NA 136 135 134* 129*  --  128*  K 5.3* 5.4* 5.5* 5.6* 4.8 5.2*  CL 102 99 100 97*  --  98  CO2 15* 20* 20* 19*  --  18*  GLUCOSE 185* 179* 74 200*  --  152*  BUN 38* 41* 42* 44*  --  41*  CREATININE 2.24* 2.44* 2.48* 2.45*  --  2.36*  CALCIUM  8.8* 9.6 9.3 9.1  --  9.1  PHOS  --   --  3.6  --   --   --    GFR: Estimated Creatinine Clearance: 22.7 mL/min (A) (by C-G formula based on SCr of 2.36 mg/dL (H)). Liver Function Tests: Recent Labs  Lab 10/22/24 2105 10/24/24 0514 10/25/24 0440  AST 72* 34 44*  ALT 60* 45* 49*  ALKPHOS 126 124 135*  BILITOT 0.8 0.7 0.8  PROT 5.7* 5.7* 5.8*  ALBUMIN  3.6 3.3* 3.4*   Recent Labs  Lab 10/22/24 2105  LIPASE 27   No results for input(s):  AMMONIA in the last 168 hours. Coagulation Profile: No results for input(s): INR, PROTIME in the last 168 hours. Cardiac Enzymes: No results for input(s): CKTOTAL, CKMB, CKMBINDEX, TROPONINI in the last 168 hours. BNP (last 3  results) Recent Labs    05/14/24 1416 09/23/24 1117 10/05/24 1620  PROBNP 1,468.0* 20,065.0* 27,900.0*   HbA1C: Recent Labs    10/23/24 1032  HGBA1C 7.9*   CBG: Recent Labs  Lab 10/24/24 0903 10/24/24 1145 10/24/24 1711 10/24/24 2041 10/25/24 0728  GLUCAP 257* 246* 159* 87 280*   Lipid Profile: No results for input(s): CHOL, HDL, LDLCALC, TRIG, CHOLHDL, LDLDIRECT in the last 72 hours. Thyroid  Function Tests: Recent Labs    10/23/24 1032  TSH 3.030   Anemia Panel: No results for input(s): VITAMINB12, FOLATE, FERRITIN, TIBC, IRON , RETICCTPCT in the last 72 hours. Sepsis Labs: No results for input(s): PROCALCITON, LATICACIDVEN in the last 168 hours.  Recent Results (from the past 240 hours)  Urine Culture (for pregnant, neutropenic or urologic patients or patients with an indwelling urinary catheter)     Status: None   Collection Time: 10/23/24 10:00 AM   Specimen: Urine, Clean Catch  Result Value Ref Range Status   Specimen Description   Final    URINE, CLEAN CATCH Performed at [facility], [address] [place], [place] [address]    Special Requests   Final    NONE Performed at [facility], [address] [place], [place] [address]    Culture   Final    NO GROWTH Performed at [facility], [address] [place], [place] [address]    Report Status 10/24/2024 FINAL  Final     Radiology Studies: No results found.   Scheduled Meds:  allopurinol   50 mg Oral QODAY   Chlorhexidine  Gluconate Cloth  6 each Topical Daily   clopidogrel   75 mg Oral Daily   collagenase   1 Application Topical Daily   empagliflozin   25 mg Oral Daily   heparin    5,000 Units Subcutaneous Q8H   insulin  aspart  0-15 Units Subcutaneous TID WC   insulin  aspart  0-5 Units Subcutaneous QHS   pantoprazole   40 mg Oral BID   rosuvastatin   10 mg Oral Daily   sodium chloride  flush  3 mL Intravenous Q12H   sodium zirconium cyclosilicate   10 g Oral Once   sucralfate   1 g Oral BID   terazosin   5 mg Oral BID   Continuous Infusions:     LOS: 1 day   Fredia Skeeter, MD [facility]  10/25/2024, 7:44 AM   *Please note that this is a verbal dictation therefore any spelling or grammatical errors are due to the Dragon [employer] One system interpretation.  Please page via Amion and do not message via secure chat for urgent patient care matters. Secure chat can be used for non urgent patient care matters.  How to contact the [facility] Attending or Consulting provider 7A - 7P or covering provider during after hours 7P -7A, for this patient?  Check the care team in [facility] and look for a) attending/consulting [facility] provider listed and b) the [facility] team listed. Page or secure chat 7A-7P. Log into [email or web address] and use [facility]'s universal password to access. If you do not have the password, please contact the hospital operator. Locate the [facility] provider you are looking for under [facility] and page to a number that you can be directly reached. If you still have difficulty reaching the provider, please page the [facility] (Director on Call) for the Hospitalists listed on amion for assistance.  "

## 2024-10-25 NOTE — Consult Note (Addendum)
 "                                                  Palliative Care Consult Note                                  Date: 10/25/2024   Patient Name: Keith Espinoza  DOB: [birth year]  MRN: [id number]  Age / Sex: 84 y.o., male  PCP: Charlott Dorn LABOR, MD Referring Physician: Vernon Ranks, MD  Reason for Consultation: Establishing goals of care  Past Medical History:  Diagnosis Date   Allergic rhinitis    Anemia    Aortic stenosis    Arthritis    Basal cell carcinoma 11/01/2019    bcc left chest treatment [place] [id number]    Below-knee amputation of right lower extremity (HCC) 12/12/2020   CHF (congestive heart failure) (HCC)    Chronic leg pain    right   Chronic lower back pain    Coronary artery disease    a. Stenting to RCA 2004; staged DES to LAD and Cx 2004. DES to [facility] 2012. b. DES to mCx, PTCA to dCx 11/2011. c. Lateral wall MI 2013 s/p PTCA to distal Cx & DES to mid OM2 11/2011. d. Low risk nuc 04/2014, EF wnl.   COVID-19    Dehiscence of amputation stump (HCC)    Diabetes mellitus    Insulin  dependent   Diabetic neuropathy (HCC)    MILD   Diverticulosis    Dysrhythmia    Edema of both lower extremities 07/05/2023   Fecal impaction (HCC) 02/01/2021   Bertrum syndrome    Gout    right wrist; right foot; right elbow; have had it since 1970's   H/O hiatal hernia    Heart murmur    Hip dislocation, right (HCC) 04/13/2019   History of echocardiogram    aortic sclerosis per echo 12/09 EF 65%, otherwise normal   History of hemorrhoids    BLEEDING   History of kidney stones    h/o   Hypertension    Diagnosed 1995    Myocardial infarction [facility])    Pancreatic pseudocyst    a. s/p remote drainage 2006.   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  Before meeting with the patient/family, I spent time reviewing the chart notes including admission H&P, hospitalist note from yesterday, PT and OT notes from yesterday, nurse note from yesterday, hospice note from today, nursing notes from today. I also reviewed vital signs, nursing flowsheets, medication administrations record, labs, and imaging.  I met with the patient at the bedside, no family was present.   We meet to discuss diagnosis prognosis, GOC, EOL wishes, disposition and  options. Concept of Palliative Care was introduced as specialized medical care for people and their families living with serious illness.  If focuses on providing relief from the symptoms and stress of a serious illness.  The goal is to improve quality of life for both the patient and the family. Values and goals of care important to patient and family were attempted to be elicited.  Created space and opportunity for patient  and family to explore thoughts and feelings regarding current medical situation   Natural trajectory and current clinical status were discussed. Questions and concerns addressed. Patient  encouraged to call with questions or concerns.    Patient/Family Understanding of Illness: He understands he has heart problems and heart failure, that his stents are all stopped up and they cannot do anything about it.  At his last cardiology appointment they offered medical management, but did not change his meds.  So he figures at this point he will likely go home and die.  Hospitalized I am talking about the details about his heart disease and the fact that he is  correct, there is not much that can be done to fix it.  He understands his heart disease and likely limit his life.  Life Review: His wife died 4 years ago and when she passed he had been married for 62 years.  He has a daughter Ida, his other daughter died 11 years ago.  He has a granddaughter Market Researcher and another granddaughter Isaiah who is a clinical research associate who lives in [place].  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.  He enjoys feeling good, going on scenic drives with his daughter when she comes by to spend time with him once a week.  Previously he really enjoyed yard work and specifically mowing, although he cannot do this anymore.  Patient Values: Family  Baseline Status: Lives at home, daughter lives nearby.  He describes a strong family support.  Today's Discussion: In addition to discussions described above we had extensive discussions on various topics.  Spent a lot of time talking about his heart disease will limit his life.  He speaks candidly about death and states that he is not worried to die and knows he is not going to live forever.  Spent a lot of time talking on quality of life and his family.  He generally is at home alone most the time for Monday through Friday, but on Saturday his daughter comes and spends time.  They go shopping, get groceries, and just go on drives to [place].  We spent time talking about his mortality.  He indicates he just wants to die at home.  We spent time talking about past moving forward including continued curative intent such as therapy.  He states that he does like having therapy at home to help him get stronger and he would like to eventually be able to begin using his walker again to be more ambulatory.  We discussed an alternative, weight his end-of-life is approaching, for hospice care. I described hospice as a service for patients who have a life expectancy of 6  months or less. The goal of hospice is the preservation of dignity and quality at the end phases of life. Under hospice care, the focus changes from curative to symptom relief. I explained the three setting where hospice services can be provided including the home, at a living facility (such as LTC  SNF, Assisted Living, etc), and a hospice facility. I explained that acceptance to hospice in any specific location is the final decision of the hospice medical director and bed availability, if applicable. They verbalized understanding.  The patient is very clear that he does not and will never want hospice care.  He describes what seems to be a poor experience with his wife passing.  I shared that any care in his life is his choice and I very much understand that he does not want hospice care and I will share this with the rest of the medical team.  Finally, we talked about CODE STATUS.  Initially he seemed to indicate he would want resuscitation but after further discussion about elements of resuscitation and the need to do all or none.  I posed a question that if his heart were to stop right now and he were to pass away in the bed, would he want us  to do everything including ventilator, tubes, shocking, chest compressions, etc to attempt to bring him back knowing that it is likely would not work or, would he want us  to let him pass peacefully.  He indicated to let me pass peacefully.  Based on this conversation I kept his CODE STATUS as DNR/DNI (DNR-Limited).  I feel he understands the conversation and decision made, based on the conversation.  I shared that I would come back and check on the patient in a couple days if he remains in the hospital.  He is hopeful to be able to get home before the snowstorm gets bad this weekend.  I provided her contact information for any questions or concerns while he remains in the hospital.  I provided emotional and general support through therapeutic listening, empathy,  sharing of stories, and other techniques. I answered all questions and addressed all concerns to the best of my ability.  After seeing the patient I did debrief with the bedside nurse and hospitalist and discussions that were had today.  Goals: To get home, return to home health PT/OT and attempt to be able to use his walker again. NO HD, NO HOSPICE EVER. DNR/DNI  Review of Systems  Respiratory:  Negative for shortness of breath.   Cardiovascular:  Negative for chest pain.  Gastrointestinal:  Negative for abdominal pain, nausea and vomiting.    Objective:   Primary Diagnoses: Present on Admission:  UTI (urinary tract infection)   Vital Signs:  BP (!) 86/62 (BP Location: Left Arm) Comment: MD notified and aware  Pulse 87   Temp 97.6 F (36.4 C) (Oral)   Resp 16   Ht 5' 9 (1.753 m)   Wt 69 kg   SpO2 100%   BMI 22.46 kg/m   Physical Exam Vitals and nursing note reviewed.  Constitutional:      General: He is sleeping. He is not in acute distress.    Appearance: He is ill-appearing. He is not toxic-appearing.  HENT:     Head: Normocephalic and atraumatic.  Cardiovascular:     Rate and Rhythm: Normal rate.  Pulmonary:     Effort: Pulmonary effort is normal. No respiratory distress.  Abdominal:     General: Abdomen is flat.     Palpations: Abdomen is soft.  Skin:    General: Skin is warm and dry.  Neurological:     General: No focal deficit present.     Mental Status: He is oriented to person, place, and time and easily aroused.  Psychiatric:  Mood and Affect: Mood normal.        Behavior: Behavior normal.     Palliative Assessment/Data: 60%   Assessment & Plan:   HPI/Patient Profile: 84 y.o. 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  He is noted to be very frail, chronically ill and wheelchair-bound with coronary artery  disease with stenting starting in 2004 to RCA, LAD, CX, last cardiac catheterization in July 2024 revealing diffuse coronary artery disease and was turndown for CABG.  He presented with not feeling well, weakness, unable to keep down food.  After ED evaluation he was admitted on 10/22/2024 with abdominal pain, anorexia, nausea, asymptomatic back to the area high anion gap acidosis with CKD stage IV, hypotension, generalized weakness history of CAD and PAD not amenable to invasive treatment, and others.   Palliative medicine was consulted for GOC conversations and support of complex care decision making.  SUMMARY OF RECOMMENDATIONS   DNR-Limited Continue current scope of care otherwise Patient hopeful to be able to discharge home with PT/OT NO HD NO HOSPICE EVER (previous poor experience) Palliative medicine will follow-up in a couple days if he remains admitted  Symptom Management:  Per primary team Palliative medicine is available to assist as needed  Code Status: DNR - Limited (DNR/DNI)  Prognosis:  Unable to determine  Discharge Planning:  Home with Home Health   Discussed with: Patient, medical team, nursing team    Thank you for allowing us  to participate in the care of Keith Espinoza PMT will continue to support holistically.  Time Total: 75 min  Detailed review of medical records (labs, imaging, vital signs), medically appropriate exam, discussed with treatment team, counseling and education to patient, family, & staff, documenting clinical information, medication management, coordination of care  Signed by: Camellia Kays, NP Palliative Medicine Team  Team Phone # [phone] (Nights/Weekends)  10/25/2024, 2:38 PM  "

## 2024-10-26 ENCOUNTER — Encounter (HOSPITAL_COMMUNITY): Payer: Self-pay

## 2024-10-26 ENCOUNTER — Other Ambulatory Visit (HOSPITAL_COMMUNITY): Payer: Self-pay

## 2024-10-26 DIAGNOSIS — R109 Unspecified abdominal pain: Secondary | ICD-10-CM | POA: Diagnosis not present

## 2024-10-26 DIAGNOSIS — I959 Hypotension, unspecified: Secondary | ICD-10-CM | POA: Diagnosis not present

## 2024-10-26 DIAGNOSIS — E875 Hyperkalemia: Secondary | ICD-10-CM | POA: Insufficient documentation

## 2024-10-26 DIAGNOSIS — Z7189 Other specified counseling: Secondary | ICD-10-CM | POA: Diagnosis not present

## 2024-10-26 DIAGNOSIS — Z515 Encounter for palliative care: Secondary | ICD-10-CM | POA: Diagnosis not present

## 2024-10-26 DIAGNOSIS — L8990 Pressure ulcer of unspecified site, unspecified stage: Secondary | ICD-10-CM | POA: Insufficient documentation

## 2024-10-26 DIAGNOSIS — Z66 Do not resuscitate: Secondary | ICD-10-CM | POA: Diagnosis not present

## 2024-10-26 DIAGNOSIS — N390 Urinary tract infection, site not specified: Secondary | ICD-10-CM | POA: Diagnosis not present

## 2024-10-26 LAB — BASIC METABOLIC PANEL WITH GFR
Anion gap: 16 — ABNORMAL HIGH (ref 5–15)
BUN: 42 mg/dL — ABNORMAL HIGH (ref 8–23)
CO2: 16 mmol/L — ABNORMAL LOW (ref 22–32)
Calcium: 9.3 mg/dL (ref 8.9–10.3)
Chloride: 96 mmol/L — ABNORMAL LOW (ref 98–111)
Creatinine, Ser: 2.34 mg/dL — ABNORMAL HIGH (ref 0.61–1.24)
GFR, Estimated: 27 mL/min — ABNORMAL LOW
Glucose, Bld: 190 mg/dL — ABNORMAL HIGH (ref 70–99)
Potassium: 5.5 mmol/L — ABNORMAL HIGH (ref 3.5–5.1)
Sodium: 128 mmol/L — ABNORMAL LOW (ref 135–145)

## 2024-10-26 LAB — LACTIC ACID, PLASMA: Lactic Acid, Venous: 3 mmol/L (ref 0.5–1.9)

## 2024-10-26 LAB — GLUCOSE, CAPILLARY: Glucose-Capillary: 184 mg/dL — ABNORMAL HIGH (ref 70–99)

## 2024-10-26 MED ORDER — CALCIUM GLUCONATE-NACL 1-0.675 GM/50ML-% IV SOLN
1.0000 g | Freq: Once | INTRAVENOUS | Status: DC
  Filled 2024-10-26: qty 50

## 2024-10-26 MED ORDER — DEXTROSE 50 % IV SOLN
1.0000 | Freq: Once | INTRAVENOUS | Status: DC
  Filled 2024-10-26: qty 50

## 2024-10-26 MED ORDER — SODIUM CHLORIDE 0.9 % IV BOLUS
250.0000 mL | Freq: Once | INTRAVENOUS | Status: AC
  Administered 2024-10-26: 250 mL via INTRAVENOUS

## 2024-10-26 MED ORDER — SODIUM ZIRCONIUM CYCLOSILICATE 10 G PO PACK
10.0000 g | PACK | Freq: Once | ORAL | Status: AC
  Administered 2024-10-26: 10 g via ORAL
  Filled 2024-10-26: qty 1

## 2024-10-26 MED ORDER — ROSUVASTATIN CALCIUM 10 MG PO TABS
10.0000 mg | ORAL_TABLET | Freq: Every day | ORAL | 0 refills | Status: AC
  Filled 2024-10-26: qty 30, 30d supply, fill #0

## 2024-10-26 MED ORDER — INSULIN ASPART 100 UNIT/ML IV SOLN
10.0000 [IU] | Freq: Once | INTRAVENOUS | Status: DC
  Filled 2024-10-26: qty 10

## 2024-10-26 MED ORDER — ALBUMIN HUMAN 25 % IV SOLN
50.0000 g | Freq: Four times a day (QID) | INTRAVENOUS | Status: DC
  Administered 2024-10-26: 50 g via INTRAVENOUS
  Filled 2024-10-26: qty 200

## 2024-10-26 NOTE — Discharge Summary (Signed)
 Physician Discharge Summary  Keith Espinoza FMW:[id number] DOB: July 10, 1941 DOA: 10/22/2024  PCP: Charlott Dorn LABOR, MD  Admit date: 10/22/2024 Discharge date: 10/26/2024 30 Day Unplanned Readmission Risk Score    Flowsheet Row ED to Hosp-Admission (Current) from 10/22/2024 in [facility] GENERAL MED/SURG UNIT  30 Day Unplanned Readmission Risk Score (%) 35.98 Filed at 10/26/2024 0801    This score is the patient's risk of an unplanned readmission within 30 days of being discharged (0 -100%). The score is based on dignosis, age, lab data, medications, orders, and past utilization.   Low:  0-14.9   Medium: 15-21.9   High: 22-29.9   Extreme: 30 and above          Admitted From: Home Disposition: Home  Recommendations for Outpatient Follow-up:  Follow up with PCP in 1-2 weeks Please obtain BMP/CBC in one week Please follow up with your PCP on the following pending results: Unresulted Labs (From admission, onward)     Start     Ordered   10/26/24 1600  Potassium  Once-Timed,   TIMED       Comments: Until normal twice.    10/26/24 0735   10/26/24 1600  Lactic acid, plasma  (Lactic Acid)  Once,   R        10/26/24 0735              Home Health: Yes Equipment/Devices: None-patient already has a walker  Discharge Condition: Stable CODE STATUS: DNR Diet recommendation:  Diet Order             DIET DYS 3 Room service appropriate? Yes; Fluid consistency: Thin  Diet effective now                   Subjective: Patient seen and examined.  He has no complaints.  Due to worsening leukocytosis and recurrent hyperkalemia, I had consulted nephrology and ordered IV albumin  and we were planning to transfer him to telemetry floor however I was informed by the nurse that patient has refused to be transferred and he is insisting to discharge home.  I went to talk to the patient.  Patient's primary nurse was with me during the whole conversation.  I also called  patient's daughter who was listening to the whole conversation over the speaker phone.  Patient tells me  I am not going to stay in the hospital anymore, you told me yesterday that he will discharge me today, I am 84 year old, I know I am going to die and I am ready for this, I have adequate life, I do not regret anything, I want to go home and remain in peace and not be poked.  I informed him that it may be dangerous for him to go home as he is not medically stable due to all the medical issues listed below.  Patient is fully alert and oriented, has the capacity to make decisions for himself.  He understood all of it and despite of that decided to go home.  He is daughter who was on the speaker phone was also on the same page with him and she said,  I think my father has given up and I would want to support his own decision.  I encouraged him to sign up for the hospice if that is what he wants however due to previous bad experience with hospice with his wife, patient has a lot of negative impression about hospice.  Brief/Interim Summary: Keith Espinoza is a  84 y.o. male who is very frail, chronically ill and wheelchair-bound with coronary artery disease with stenting starting in 2004 to RCA, LAD, CX, last cardiac catheterization in July 2024 revealing diffuse coronary artery disease and was turndown for CABG, hypertension, diabetes, hyperlipidemia, combined congestive diastolic and systolic congestive heart failure and multiple comorbidities as listed below presented to [facility] ED with a complaint of not feeling well, weakness and unable to eat or drink anything for last 2 to 3 days. Anytime he tries to eat anything solid, he vomits.  He also was having abdominal pain for last 2 days.  No fever, any urinary complaints, chills or sweating.  No sick contact or recent travel.   Upon arrival to ED, patient was hemodynamically stable, dropped his blood pressure to 89/66 at 1 point in time.  Mild bradycardia  which was also transient. was found to have mild hyperkalemia along with metabolic acidosis.  Also had urinary retention, indwelling Foley catheter was placed.  CT abdomen pelvis unremarkable.  Lipase normal.  UA showed some bacteria and crystals, no nitrites, no leukoesterase no white blood cells and does not have leukocytosis.  However started on Rocephin  for presumed UTI.  Hospitalist requested for admission.  Details below.   Abdominal pain/anorexia/nausea: Symptoms are improving.  No etiology was found on the CT abdomen and pelvis.  Only small to moderate ascites.  Patient denies any history of liver disease.  Patient afebrile without leukocytosis, doubt infectious etiology.  Now tolerating regular diet and has no symptoms.   Asymptomatic bacteriuria: Patient denies having any urinary complaints.  UA shows crystals and some bacteria but negative nitrites and leukoesterase.  No fever, no leukocytosis, CT abdomen pelvis negative for any inflammation in the bladder or hydronephrosis or pyelonephritis.  Patient has been started on Rocephin  empirically.  I do not believe patient has true UTI.  Discontinued Rocephin .   High anion gap metabolic acidosis with CKD stage IV: Baseline creatinine around 2.5, patient's creatinine is at baseline however he presented with CO2 of 15 which improved to 20 but dropped to 16 again today.  I consulted nephrology however patient has decided to go home.   Recurrent hypotension/lactic acidosis: Blood pressure 81/54 this morning but patient remains asymptomatic.  Continues to have elevated lactic acid.  Unsure if this blood pressure is his baseline and elevated lactic acid is also his baseline.  Blood pressure is slightly better this morning.  Gave him 1 dose of IV albumin  25 g yesterday, will repeat 2 doses today and repeat lactic acid later today.  Patient and daughter confirmed that patient always runs low blood pressure at home.  Patient is asymptomatic currently.    Hyperkalemia: Remains hyperkalemic despite of treatment.  Currently 5.5.  I ordered Lokelma , dextrose  insulin  but patient refused to take those medications.   History of BPH now with acute urinary retention: Indwelling Foley catheter was placed in the ED. resumed terazosin .  Foley catheter was removed 10/25/2024, voided successfully.   Generalized weakness: Consulted PT OT.  Per them, he did not want to be seen by them and he refused SNF.  Home health PT OT has been recommended.   History of gout: Resumed allopurinol .   Chronic combined systolic and diastolic congestive heart failure: Patient appears dry..  Resuming Lasix  but discontinuing Jardiance  due to lactic acidosis.   History of CAD and PAD: Patient at 1 point in time was on aspirin  and Plavix .  According to daughter, both of these medications were held at recent  hospitalization at outside Hospital.  She does not know the reason.  I have resumed Plavix  for now.   Hyperlipidemia: Resume Crestor .   Type 2 diabetes mellitus: Last hemoglobin A1c 9.0 about a year ago.  PTA medications include Jardiance , metformin , NovoLog  FlexPen insulin  as needed and Tresiba  insulin  5 units daily.  Hemoglobin A1c 7.9..  Blood sugar labile.  Discontinue metformin  and Jardiance  due to persistent lactic acidosis.   History of paroxysmal atrial fibrillation: Currently in sinus rhythm.  Does not appear to be on any rate control medications or anticoagulation, likely due to anemia and thrombocytopenia.  Daughter tells me that for some reason, ED physician discontinued aspirin  and Plavix  both during recent ED visit.  I have resumed Plavix .   Anemia of chronic disease: Hemoglobin at baseline around 10, currently at baseline.  Monitor closely.  No signs of bleeding.  Denies any hematochezia or melena.   Chronic thrombocytopenia: Stable.  No signs of bleeding.   Goal of care: Patient expressed his desire to not pursue aggressive care for him.  Palliative care was  consulted.  Initially he was only DNI and not DNR, now he is full DNR.  Per their note, patient is not interested in hospice/comfort care.  He wants to go home with home health PT OT.  Does not want to go to SNF either.  Discharge plan was discussed with patient and/or family member and they verbalized understanding and agreed with it.  Discharge Diagnoses:  Active Problems:   Hypotension   Pressure injury of skin   Hyperkalemia    Discharge Instructions   Allergies as of 10/26/2024       Reactions   Zetia [ezetimibe] Other (See Comments)   Myalgias    Zocor [simvastatin] Other (See Comments)   Myalgias    Dilaudid  [hydromorphone  Hcl] Other (See Comments)   Hallucinations    Entresto  [sacubitril -valsartan ] Other (See Comments)   Discontinued by MD due to persistent hypotension    Imdur  [isosorbide  Nitrate] Other (See Comments)   Discontinued by MD due to persistent hypotension with isosorbide  ER 60mg .   Toprol  Xl [metoprolol ] Other (See Comments)   Discontinued by MD due to persistent hypotension at 12.5mg          Medication List     STOP taking these medications    Jardiance  25 MG Tabs tablet Generic drug: empagliflozin    metFORMIN  1000 MG tablet Commonly known as: GLUCOPHAGE    potassium chloride  SA 20 MEQ tablet Commonly known as: KLOR-CON  M       TAKE these medications    Acetaminophen  Extra Strength 500 MG Tabs Take 2 tablets (1,000 mg total) by mouth every 6 (six) hours. What changed:  when to take this reasons to take this   allopurinol  100 MG tablet Commonly known as: ZYLOPRIM  Take 0.5 tablets (50 mg total) by mouth every other day. What changed: when to take this   clopidogrel  75 MG tablet Commonly known as: PLAVIX  Take 1 tablet (75 mg total) by mouth daily.   Dexcom G7 Sensor Misc 1 Device by Does not apply route as directed.   dicyclomine  10 MG capsule Commonly known as: BENTYL  Take 1 capsule (10 mg total) by mouth 3 (three) times daily  as needed for spasms.   furosemide  20 MG tablet Commonly known as: Lasix  Take 1 tablet (20 mg total) by mouth daily.   gabapentin  300 MG capsule Commonly known as: NEURONTIN  Take 1 capsule (300 mg total) by mouth at bedtime. What changed: when to  take this   Insulin  Pen Needle 32G X 4 MM Misc 1 Device by Does not apply route in the morning, at noon, in the evening, and at bedtime.   MAGNESIUM  PO Take 1 tablet by mouth daily.   meclizine  12.5 MG tablet Commonly known as: ANTIVERT  Take 1 tablet (12.5 mg total) by mouth daily as needed for dizziness.   nitroGLYCERIN  0.4 MG SL tablet Commonly known as: NITROSTAT  Place 1 tablet (0.4 mg total) under the tongue every 5 (five) minutes x 3 doses as needed for chest pain.   NovoLOG  FlexPen 100 UNIT/ML FlexPen Generic drug: insulin  aspart Max daily 30 units What changed:  how much to take how to take this when to take this additional instructions   pantoprazole  40 MG tablet Commonly known as: PROTONIX  Take 1 tablet (40 mg total) by mouth 2 (two) times daily.   rosuvastatin  10 MG tablet Commonly known as: CRESTOR  Take 1 tablet (10 mg total) by mouth daily. Start taking on: October 27, 2024   sucralfate  1 GM/10ML suspension Commonly known as: Carafate  Take 10 mLs (1 g total) by mouth 2 (two) times daily.   terazosin  5 MG capsule Commonly known as: HYTRIN  Take 5 mg by mouth 2 (two) times daily.   Tresiba  FlexTouch 100 UNIT/ML FlexTouch Pen Generic drug: insulin  degludec Inject 5 Units into the skin daily. What changed: when to take this   TURMERIC CURCUMIN PO Take 1 tablet by mouth daily.   VITAMIN B-12 PO Take 1 tablet by mouth daily.   VITAMIN C PO Take 1 tablet by mouth daily.   VITAMIN D -3 PO Take 1 capsule by mouth daily.        Contact information for follow-up providers     Cirigliano, Vito V, DO. Schedule an appointment as soon as possible for a visit .   Specialty: Gastroenterology Contact  information: [address] [place] [address] [phone]         Charlott Dorn LABOR, MD Follow up in 1 week(s).   Specialty: Internal Medicine Contact information: [address] [place] [address] [phone]              Contact information for after-discharge care     Home Medical Care     [facility]) .   Service: Home Health Services Contact information: [address] [place] [address] [place]  [address] [phone]                    Allergies[1]  Consultations: None   Procedures/Studies: CT ABDOMEN PELVIS WO CONTRAST Result Date: 10/22/2024 EXAM: CT ABDOMEN AND PELVIS WITHOUT CONTRAST 10/22/2024 09:48:46 PM TECHNIQUE: CT of the abdomen and pelvis was performed without the administration of intravenous contrast. Multiplanar reformatted images are provided for review. Automated exposure control, iterative reconstruction, and/or weight-based adjustment of the mA/kV was utilized to reduce the radiation dose to as low as reasonably achievable. COMPARISON: 09/23/2024 CLINICAL HISTORY: Abdominal pain, acute, nonlocalized. FINDINGS: LOWER CHEST: Small right and trace left pleural effusions, chronic. Mild patchy right lower lobe opacity, likely atelectasis. Severe 3 vessel coronary atherosclerosis. Hypodense blood pool relative to myocardium, suggesting anemia. LIVER: The liver is unremarkable. GALLBLADDER AND BILE DUCTS: Status post cholecystectomy. No biliary ductal dilatation. SPLEEN: No acute abnormality. PANCREAS: No acute abnormality. ADRENAL GLANDS: No acute abnormality. KIDNEYS, URETERS AND BLADDER: Multiple nonobstructing bilateral renal calculi measuring up to 5 mm, unchanged. No hydronephrosis. No perinephric or  periureteral stranding. Urinary bladder is unremarkable. GI AND BOWEL: Suture line along the stomach. There is no bowel obstruction. PERITONEUM AND RETROPERITONEUM: Small to  moderate abdominopelvic ascites, mildly progressive. No free air. VASCULATURE: Atherosclerotic calcifications of the abdominal aorta and branch vessels. LYMPH NODES: No lymphadenopathy. REPRODUCTIVE ORGANS: Mild prostatomegaly. BONES AND SOFT TISSUES: Right hip arthroplasty. Degenerative changes of the visualized thoracolumbar spine. No focal soft tissue abnormality. IMPRESSION: 1. No acute abdominopelvic abnormality. 2. Small to moderate abdominopelvic ascites, mildly progressive. 3. Small right and trace left pleural effusions, unchanged. 4. Hypodense blood pool relative to myocardium, which can be seen with anemia. Electronically signed by: Pinkie Pebbles MD 10/22/2024 09:52 PM EST RP Workstation: [id number]   DG Chest Portable 1 View Result Date: 10/05/2024 CLINICAL DATA:  Shortness of breath EXAM: PORTABLE CHEST 1 VIEW COMPARISON:  09/17/2024, CT 09/23/2024, 06/22/2023 FINDINGS: Cardiomegaly with diffuse increased interstitial opacity and probable small pleural effusions. Aortic atherosclerosis. No pneumothorax IMPRESSION: Cardiomegaly with diffuse increased interstitial opacity/probable interstitial edema and probable small pleural effusions. Electronically Signed   By: Luke Bun M.D.   On: 10/05/2024 23:10   US  Abdomen Limited RUQ (LIVER/GB) Result Date: 10/05/2024 EXAM: Right Upper Quadrant Abdominal Ultrasound 10/05/2024 08:30:00 PM TECHNIQUE: Real-time ultrasonography of the right upper quadrant of the abdomen was performed. COMPARISON: CT 09/23/2024. CLINICAL HISTORY: RUQ pain. Epigastric pain and nausea for 1 month. Previous cholecystectomy. FINDINGS: LIVER: Normal echogenicity. No intrahepatic biliary ductal dilatation. No evidence of mass. Hepatopetal flow in the portal vein. BILIARY SYSTEM: Surgical absence of the gallbladder. No bile duct dilatation. Common bile duct measures 2 mm. OTHER: A small amount of free fluid is demonstrated in the upper abdomen. Small right pleural effusion.  Examination is somewhat limited due to overlying bowel gas. IMPRESSION: 1. No sonographic evidence of biliary obstruction status post cholecystectomy. 2. Small volume upper abdominal free fluid. 3. Small right pleural effusion. 4. Evaluation is limited by overlying bowel gas. Electronically signed by: Elsie Gravely MD 10/05/2024 09:19 PM EST RP Workstation: [id number]     Discharge Exam: Vitals:   10/26/24 0450 10/26/24 0749  BP: 97/81 104/79  Pulse: 96 94  Resp:    Temp: (!) 97.5 F (36.4 C) 98.4 F (36.9 C)  SpO2: 100% 100%   Vitals:   10/25/24 2130 10/26/24 0111 10/26/24 0450 10/26/24 0749  BP: 93/66 107/72 97/81 104/79  Pulse: 95 99 96 94  Resp:      Temp: (!) 97.4 F (36.3 C) (!) 97 F (36.1 C) (!) 97.5 F (36.4 C) 98.4 F (36.9 C)  TempSrc:    Axillary  SpO2: 99% 100% 100% 100%  Weight:      Height:        General: Pt is alert, awake, not in acute distress Cardiovascular: RRR, S1/S2 +, no rubs, no gallops Respiratory: CTA bilaterally, no wheezing, no rhonchi Abdominal: Soft, NT, ND, bowel sounds + Extremities: +3 pitting edema left lower extremity, right BKA.    The results of significant diagnostics from this hospitalization (including imaging, microbiology, ancillary and laboratory) are listed below for reference.     Microbiology: Recent Results (from the past 240 hours)  Urine Culture (for pregnant, neutropenic or urologic patients or patients with an indwelling urinary catheter)     Status: None   Collection Time: 10/23/24 10:00 AM   Specimen: Urine, Clean Catch  Result Value Ref Range Status   Specimen Description   Final    URINE, CLEAN CATCH Performed at [facility], [phone]  [place], [place], [place] [address]    Special Requests   Final    NONE Performed at [facility], [address], [place], [place] [address]    Culture   Final    NO GROWTH Performed at [facility], [address]  [place], [place] [address]    Report Status 10/24/2024 FINAL  Final     Labs: BNP (last 3 results) No results for input(s): BNP in the last 8760 hours. Basic Metabolic Panel: Recent Labs  Lab 10/23/24 0417 10/23/24 1032 10/24/24 0514 10/24/24 1839 10/25/24 0440 10/25/24 2022 10/26/24 0432  NA 135 134* 129*  --  128*  --  128*  K 5.4* 5.5* 5.6* 4.8 5.2* 5.3* 5.5*  CL 99 100 97*  --  98  --  96*  CO2 20* 20* 19*  --  18*  --  16*  GLUCOSE 179* 74 200*  --  152*  --  190*  BUN 41* 42* 44*  --  41*  --  42*  CREATININE 2.44* 2.48* 2.45*  --  2.36*  --  2.34*  CALCIUM  9.6 9.3 9.1  --  9.1  --  9.3  PHOS  --  3.6  --   --   --   --   --    Liver Function Tests: Recent Labs  Lab 10/22/24 2105 10/24/24 0514 10/25/24 0440  AST 72* 34 44*  ALT 60* 45* 49*  ALKPHOS 126 124 135*  BILITOT 0.8 0.7 0.8  PROT 5.7* 5.7* 5.8*  ALBUMIN  3.6 3.3* 3.4*   Recent Labs  Lab 10/22/24 2105  LIPASE 27   No results for input(s): AMMONIA in the last 168 hours. CBC: Recent Labs  Lab 10/22/24 2015 10/23/24 1032 10/24/24 0514 10/25/24 0440  WBC 5.6 6.4 5.6 4.9  NEUTROABS  --   --   --  3.4  HGB 10.2* 9.6* 9.5* 9.4*  HCT 33.0* 30.8* 29.9* 29.6*  MCV 99.1 100.7* 98.7 99.7  PLT 75* 79* 75* 75*   Cardiac Enzymes: No results for input(s): CKTOTAL, CKMB, CKMBINDEX, TROPONINI in the last 168 hours. BNP: Invalid input(s): POCBNP CBG: Recent Labs  Lab 10/25/24 0728 10/25/24 1113 10/25/24 1542 10/25/24 2129 10/26/24 0819  GLUCAP 280* 170* 109* 194* 184*   D-Dimer No results for input(s): DDIMER in the last 72 hours. Hgb A1c Recent Labs    10/23/24 1032  HGBA1C 7.9*   Lipid Profile No results for input(s): CHOL, HDL, LDLCALC, TRIG, CHOLHDL, LDLDIRECT in the last 72 hours. Thyroid  function studies Recent Labs    10/23/24 1032  TSH 3.030   Anemia work up No results for input(s): VITAMINB12, FOLATE, FERRITIN, TIBC, IRON , RETICCTPCT in the  last 72 hours. Urinalysis    Component Value Date/Time   COLORURINE YELLOW 10/23/2024 0340   APPEARANCEUR CLEAR 10/23/2024 0340   LABSPEC 1.018 10/23/2024 0340   PHURINE 5.0 10/23/2024 0340   GLUCOSEU NEGATIVE 10/23/2024 0340   HGBUR MODERATE (A) 10/23/2024 0340   BILIRUBINUR NEGATIVE 10/23/2024 0340   KETONESUR NEGATIVE 10/23/2024 0340   PROTEINUR 30 (A) 10/23/2024 0340   NITRITE NEGATIVE 10/23/2024 0340   LEUKOCYTESUR NEGATIVE 10/23/2024 0340   Sepsis Labs Recent Labs  Lab 10/22/24 2015 10/23/24 1032 10/24/24 0514 10/25/24 0440  WBC 5.6 6.4 5.6 4.9   Microbiology Recent Results (from the past 240 hours)  Urine Culture (for pregnant, neutropenic or urologic patients or patients with an indwelling urinary catheter)     Status: None   Collection  Time: 10/23/24 10:00 AM   Specimen: Urine, Clean Catch  Result Value Ref Range Status   Specimen Description   Final    URINE, CLEAN CATCH Performed at [facility], [address] [place], [place] [address]    Special Requests   Final    NONE Performed at [facility], [address], [place], [place] [address]    Culture   Final    NO GROWTH Performed at [facility], [address] [place], [place] [address]    Report Status 10/24/2024 FINAL  Final    FURTHER DISCHARGE INSTRUCTIONS:   Get Medicines reviewed and adjusted: Please take all your medications with you for your next visit with your Primary MD   Laboratory/radiological data: Please request your Primary MD to go over all hospital tests and procedure/radiological results at the follow up, please ask your Primary MD to get all Hospital records sent to his/her office.   In some cases, they will be blood work, cultures and biopsy results pending at the time of your discharge. Please request that your primary care M.D. goes through all the records of your hospital data and follows up on these results.   Also Note the  following: If you experience worsening of your admission symptoms, develop shortness of breath, life threatening emergency, suicidal or homicidal thoughts you must seek medical attention immediately by calling 911 or calling your MD immediately  if symptoms less severe.   You must read complete instructions/literature along with all the possible adverse reactions/side effects for all the Medicines you take and that have been prescribed to you. Take any new Medicines after you have completely understood and accpet all the possible adverse reactions/side effects.    patient was instructed, not to drive, operate heavy machinery, perform activities at heights, swimming or participation in water activities or provide baby-sitting services while on Pain, Sleep and Anxiety Medications; until their outpatient Physician has advised to do so again. Also recommended to not to take more than prescribed Pain, Sleep and Anxiety Medications.  It is not advisable to combine anxiety, sleep and pain medications without talking with your primary care provider.     Wear Seat belts while driving.   Please note: You were cared for by a hospitalist during your hospital stay. Once you are discharged, your primary care physician will handle any further medical issues. Please note that NO REFILLS for any discharge medications will be authorized once you are discharged, as it is imperative that you return to your primary care physician (or establish a relationship with a primary care physician if you do not have one) for your post hospital discharge needs so that they can reassess your need for medications and monitor your lab values  Time coordinating discharge: Over 30 minutes  SIGNED:   Fredia Skeeter, MD  [facility] 10/26/2024, 9:16 AM *Please note that this is a verbal dictation therefore any spelling or grammatical errors are due to the Dragon Medical One system interpretation. If 7PM-7AM, please contact  night-coverage [email or web address]     [1]  Allergies Allergen Reactions   Zetia [Ezetimibe] Other (See Comments)    Myalgias    Zocor [Simvastatin] Other (See Comments)    Myalgias     Dilaudid  [Hydromorphone  Hcl] Other (See Comments)    Hallucinations    Entresto  [Sacubitril -Valsartan ] Other (See Comments)    Discontinued by MD due to persistent hypotension    Imdur  [Isosorbide  Nitrate] Other (See Comments)    Discontinued  by MD due to persistent hypotension with isosorbide  ER 60mg .   Toprol  Xl [Metoprolol ] Other (See Comments)    Discontinued by MD due to persistent hypotension at 12.5mg 

## 2024-10-26 NOTE — Progress Notes (Signed)
 Assumed care of patient this a.m. after receiving report from previous RN that pt had been ordered to transfer to another telemetry capable unit due to elevated potassium and lactic acid. Speaking to the patient this morning and explaining treatment such as medications the MD had ordered, he adamantly refused saying he was going home today and wanted no further treatment, refused to be transferred. MD was notified and came to bedside and contacted pt's daughter, pt continued to state he wanted no further treatment and wanted to be discharged. See MD note for summary of discussion, and pt ordered for discharge. DC instructions reviewed with patient, IV sites removed, and pt picked up prescriptions in [facility] pharmacy on the way out where he was picked up by daughter.

## 2024-10-26 NOTE — Progress Notes (Signed)
 " Daily Progress Note   Date: 10/26/2024   Patient Name: Keith Espinoza  DOB: August 11, 1941  MRN: [id number]  Age / Sex: 84 y.o., male  Attending Physician: Vernon Ranks, MD Primary Care Physician: Charlott Dorn LABOR, MD Admit Date: 10/22/2024 Length of Stay: 2 days  Reason for Follow-up: {Reason for Consult:23484}  Past Medical History:  Diagnosis Date   Allergic rhinitis    Anemia    Aortic stenosis    Arthritis    Basal cell carcinoma 11/01/2019    bcc left chest treatment TX cx3 61fu    Below-knee amputation of right lower extremity (HCC) 12/12/2020   CHF (congestive heart failure) (HCC)    Chronic leg pain    right   Chronic lower back pain    Coronary artery disease    a. Stenting to RCA 2004; staged DES to LAD and Cx 2004. DES to [facility] 2012. b. DES to mCx, PTCA to dCx 11/2011. c. Lateral wall MI 2013 s/p PTCA to distal Cx & DES to mid OM2 11/2011. d. Low risk nuc 04/2014, EF wnl.   COVID-19    Dehiscence of amputation stump (HCC)    Diabetes mellitus    Insulin  dependent   Diabetic neuropathy (HCC)    MILD   Diverticulosis    Dysrhythmia    Edema of both lower extremities 07/05/2023   Fecal impaction (HCC) 02/01/2021   Bertrum syndrome    Gout    right wrist; right foot; right elbow; have had it since 1970's   H/O hiatal hernia    Heart murmur    Hip dislocation, right (HCC) 04/13/2019   History of echocardiogram    aortic sclerosis per echo 12/09 EF 65%, otherwise normal   History of hemorrhoids    BLEEDING   History of kidney stones    h/o   Hypertension    Diagnosed 1995    Myocardial infarction [facility])    Pancreatic pseudocyst    a. s/p remote drainage 2006.   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 Updates received. Patient Assessed. Created space and opportunity for patient  and family to explore thoughts and feelings regarding current medical situation.  Today's Discussion: Today before meeting with the patient/family, I reviewed the chart notes including ***. I also reviewed vital signs, nursing flowsheets, medication administrations record, labs, and imaging. Labs reviewed include ***.  ***  Review of Systems  Objective:   Primary Diagnoses: Present on Admission:  Hypotension   Vital Signs:  BP 104/79 (BP Location: Left Arm)   Pulse 94   Temp 98.4 F (36.9 C) (Axillary)   Resp 16   Ht 5' 9 (1.753 m)   Wt 69 kg   SpO2 100%   BMI 22.46 kg/m   Physical Exam  Palliative Assessment/Data: ***   Existing Vynca/ACP Documentation: ***  Advanced Care Planning:   Existing Vynca/ACP Documentation: ***  Primary Decision Maker: {Primary Decision Fjxzm:78612}  Pertinent diagnosis: ***  The patient and/or family consented to a voluntary Advance Care Planning Conversation in person/over the phone***. Individuals present for the conversation: ***  Summary of the conversation: ***  Outcome of the conversations and/or documents completed: ***  I spent *** minutes providing separately identifiable ACP services with the patient and/or surrogate decision maker in a voluntary, in-person conversation discussing the patient's wishes and goals as detailed in the above note.  Assessment & Plan:   HPI/Patient Profile:  ***  SUMMARY OF RECOMMENDATIONS   ***  Symptom Management:  ***  Code Status: {Updated Palliative Code Status:33307}  Prognosis: {Palliative Care Prognosis:23504}  Discharge Planning: {Palliative dispostion:23505}  Discussed with: ***  Thank you for allowing us  to participate in the care of Kenai Fluegel PMT will continue to support holistically.  Time Total: ***  Detailed review of medical records (labs, imaging, vital signs), medically appropriate exam, discussed with treatment team, counseling and education to patient, family, & staff, documenting clinical information, medication management, coordination of care  Camellia Kays, NP Palliative Medicine Team  Team Phone # [phone] (Nights/Weekends)  06/02/2021, 8:17 AM   "

## 2024-10-26 NOTE — Progress Notes (Signed)
 Physical Therapy Treatment Patient Details Name: Keith Espinoza MRN: [id number] DOB: Apr 20, 1941 Today's Date: 10/26/2024   History of Present Illness 84 y.o. male admitted 10/22/24 with c/o weakness, nausea/vomiting, abdominal pain. Workup for urinary retention, hypotension, presumed UTI; pt was preparing for d/c but too weak to get into wheelchair. Wound care consulted for wounds to sacrum, L foot. PMH includes R BKA (2022), CAD, CHF, DM, HTN, vertigo.    PT Comments  Pt tolerates treatment well, demonstrating fair dynamic sitting balance when performing lower body dressing and donning prosthetic in sitting and then pulling up garments in standing. Pt is able to stand form elevated surfaces without physical assistance and walks a few feet to recliner without physical assistance. Pt reports he has assistance available from family that live close. HHPT recommended as pt has refused possible SNF placement.    If plan is discharge home, recommend the following: A little help with walking and/or transfers;A little help with bathing/dressing/bathroom;Assistance with cooking/housework;Assist for transportation;Help with stairs or ramp for entrance   Can travel by private vehicle        Equipment Recommendations  None recommended by PT    Recommendations for Other Services       Precautions / Restrictions Precautions Precautions: Fall Recall of Precautions/Restrictions: Intact Precaution/Restrictions Comments: h/o R BKA (prosthetic in room, refused to wear 10/25/24) Restrictions Weight Bearing Restrictions Per Provider Order: No     Mobility  Bed Mobility Overal bed mobility: Needs Assistance Bed Mobility: Supine to Sit     Supine to sit: Min assist     General bed mobility comments: hand hold to pull up from supine to sitting    Transfers Overall transfer level: Needs assistance Equipment used: Rolling walker (2 wheels) Transfers: Sit to/from Stand, Bed to  chair/wheelchair/BSC Sit to Stand: Contact guard assist, From elevated surface   Step pivot transfers: Contact guard assist, From elevated surface            Ambulation/Gait Ambulation/Gait assistance: Contact guard assist Gait Distance (Feet): 4 Feet Assistive device: Rolling walker (2 wheels) Gait Pattern/deviations: Step-to pattern Gait velocity: reduced Gait velocity interpretation: <1.8 ft/sec, indicate of risk for recurrent falls   General Gait Details: short step-to pattern from bed to recliner   Stairs             Wheelchair Mobility     Tilt Bed    Modified Rankin (Stroke Patients Only)       Balance Overall balance assessment: Needs assistance Sitting-balance support: No upper extremity supported, Feet supported Sitting balance-Leahy Scale: Good     Standing balance support: Bilateral upper extremity supported, Reliant on assistive device for balance Standing balance-Leahy Scale: Poor                              Communication Communication Communication: Impaired Factors Affecting Communication: Hearing impaired  Cognition Arousal: Alert Behavior During Therapy: WFL for tasks assessed/performed   PT - Cognitive impairments: No apparent impairments                         Following commands: Intact      Cueing Cueing Techniques: Verbal cues  Exercises      General Comments General comments (skin integrity, edema, etc.): VSS on RA      Pertinent Vitals/Pain Pain Assessment Pain Assessment: No/denies pain    Home Living  Prior Function            PT Goals (current goals can now be found in the care plan section) Acute Rehab PT Goals Patient Stated Goal: be left alone, go home Progress towards PT goals: Progressing toward goals    Frequency    Min 2X/week      PT Plan      Co-evaluation              AM-PAC PT 6 Clicks Mobility   Outcome Measure   Help needed turning from your back to your side while in a flat bed without using bedrails?: A Little Help needed moving from lying on your back to sitting on the side of a flat bed without using bedrails?: A Little Help needed moving to and from a bed to a chair (including a wheelchair)?: A Little Help needed standing up from a chair using your arms (e.g., wheelchair or bedside chair)?: A Little Help needed to walk in hospital room?: A Lot Help needed climbing 3-5 steps with a railing? : A Lot 6 Click Score: 16    End of Session Equipment Utilized During Treatment: Gait belt Activity Tolerance: Patient tolerated treatment well Patient left: in chair;with call bell/phone within reach;with chair alarm set;with nursing/sitter in room Nurse Communication: Mobility status PT Visit Diagnosis: Other abnormalities of gait and mobility (R26.89);Muscle weakness (generalized) (M62.81)     Time: 9083-9059 PT Time Calculation (min) (ACUTE ONLY): 24 min  Charges:    $Therapeutic Activity: 23-37 mins PT General Charges $$ ACUTE PT VISIT: 1 Visit                     Bernardino JINNY Ruth, PT, DPT Acute Rehabilitation Office [phone]    Bernardino JINNY Ruth 10/26/2024, 12:18 PM

## 2024-11-06 ENCOUNTER — Inpatient Hospital Stay (HOSPITAL_BASED_OUTPATIENT_CLINIC_OR_DEPARTMENT_OTHER)
Admission: EM | Admit: 2024-11-06 | Source: Home / Self Care | Attending: Internal Medicine | Admitting: Internal Medicine

## 2024-11-06 ENCOUNTER — Encounter (HOSPITAL_BASED_OUTPATIENT_CLINIC_OR_DEPARTMENT_OTHER): Payer: Self-pay | Admitting: Emergency Medicine

## 2024-11-06 ENCOUNTER — Emergency Department (HOSPITAL_BASED_OUTPATIENT_CLINIC_OR_DEPARTMENT_OTHER)

## 2024-11-06 ENCOUNTER — Other Ambulatory Visit: Payer: Self-pay

## 2024-11-06 DIAGNOSIS — I5022 Chronic systolic (congestive) heart failure: Secondary | ICD-10-CM | POA: Diagnosis not present

## 2024-11-06 DIAGNOSIS — N189 Chronic kidney disease, unspecified: Secondary | ICD-10-CM

## 2024-11-06 DIAGNOSIS — R570 Cardiogenic shock: Secondary | ICD-10-CM

## 2024-11-06 DIAGNOSIS — Z794 Long term (current) use of insulin: Secondary | ICD-10-CM

## 2024-11-06 DIAGNOSIS — I513 Intracardiac thrombosis, not elsewhere classified: Secondary | ICD-10-CM

## 2024-11-06 DIAGNOSIS — I2510 Atherosclerotic heart disease of native coronary artery without angina pectoris: Secondary | ICD-10-CM | POA: Diagnosis not present

## 2024-11-06 DIAGNOSIS — R630 Anorexia: Secondary | ICD-10-CM | POA: Diagnosis not present

## 2024-11-06 DIAGNOSIS — J90 Pleural effusion, not elsewhere classified: Principal | ICD-10-CM | POA: Diagnosis present

## 2024-11-06 DIAGNOSIS — R5383 Other fatigue: Secondary | ICD-10-CM

## 2024-11-06 DIAGNOSIS — I50813 Acute on chronic right heart failure: Secondary | ICD-10-CM | POA: Diagnosis not present

## 2024-11-06 DIAGNOSIS — E1165 Type 2 diabetes mellitus with hyperglycemia: Secondary | ICD-10-CM

## 2024-11-06 DIAGNOSIS — J9601 Acute respiratory failure with hypoxia: Secondary | ICD-10-CM | POA: Diagnosis not present

## 2024-11-06 DIAGNOSIS — R5381 Other malaise: Secondary | ICD-10-CM | POA: Diagnosis not present

## 2024-11-06 DIAGNOSIS — R1310 Dysphagia, unspecified: Secondary | ICD-10-CM

## 2024-11-06 DIAGNOSIS — R1013 Epigastric pain: Secondary | ICD-10-CM

## 2024-11-06 DIAGNOSIS — I509 Heart failure, unspecified: Secondary | ICD-10-CM

## 2024-11-06 DIAGNOSIS — J690 Pneumonitis due to inhalation of food and vomit: Secondary | ICD-10-CM | POA: Diagnosis present

## 2024-11-06 DIAGNOSIS — E785 Hyperlipidemia, unspecified: Secondary | ICD-10-CM | POA: Diagnosis present

## 2024-11-06 DIAGNOSIS — R634 Abnormal weight loss: Secondary | ICD-10-CM

## 2024-11-06 DIAGNOSIS — R9431 Abnormal electrocardiogram [ECG] [EKG]: Secondary | ICD-10-CM | POA: Diagnosis present

## 2024-11-06 DIAGNOSIS — I480 Paroxysmal atrial fibrillation: Secondary | ICD-10-CM | POA: Diagnosis present

## 2024-11-06 LAB — CBC WITH DIFFERENTIAL/PLATELET
Abs Immature Granulocytes: 0.04 10*3/uL (ref 0.00–0.07)
Basophils Absolute: 0 10*3/uL (ref 0.0–0.1)
Basophils Relative: 0 %
Eosinophils Absolute: 0 10*3/uL (ref 0.0–0.5)
Eosinophils Relative: 1 %
HCT: 30.7 % — ABNORMAL LOW (ref 39.0–52.0)
Hemoglobin: 9.7 g/dL — ABNORMAL LOW (ref 13.0–17.0)
Immature Granulocytes: 1 %
Lymphocytes Relative: 12 %
Lymphs Abs: 0.7 10*3/uL (ref 0.7–4.0)
MCH: 29.5 pg (ref 26.0–34.0)
MCHC: 31.6 g/dL (ref 30.0–36.0)
MCV: 93.3 fL (ref 80.0–100.0)
Monocytes Absolute: 0.3 10*3/uL (ref 0.1–1.0)
Monocytes Relative: 6 %
Neutro Abs: 4.2 10*3/uL (ref 1.7–7.7)
Neutrophils Relative %: 80 %
Platelets: 91 10*3/uL — ABNORMAL LOW (ref 150–400)
RBC: 3.29 MIL/uL — ABNORMAL LOW (ref 4.22–5.81)
RDW: 17.7 % — ABNORMAL HIGH (ref 11.5–15.5)
WBC: 5.3 10*3/uL (ref 4.0–10.5)
nRBC: 0 % (ref 0.0–0.2)

## 2024-11-06 LAB — BASIC METABOLIC PANEL WITH GFR
Anion gap: 15 (ref 5–15)
BUN: 43 mg/dL — ABNORMAL HIGH (ref 8–23)
CO2: 25 mmol/L (ref 22–32)
Calcium: 9.3 mg/dL (ref 8.9–10.3)
Chloride: 97 mmol/L — ABNORMAL LOW (ref 98–111)
Creatinine, Ser: 1.91 mg/dL — ABNORMAL HIGH (ref 0.61–1.24)
GFR, Estimated: 34 mL/min — ABNORMAL LOW
Glucose, Bld: 438 mg/dL — ABNORMAL HIGH (ref 70–99)
Potassium: 3.1 mmol/L — ABNORMAL LOW (ref 3.5–5.1)
Sodium: 137 mmol/L (ref 135–145)

## 2024-11-06 LAB — COMPREHENSIVE METABOLIC PANEL WITH GFR
ALT: 19 U/L (ref 0–44)
AST: 21 U/L (ref 15–41)
Albumin: 3.5 g/dL (ref 3.5–5.0)
Alkaline Phosphatase: 117 U/L (ref 38–126)
Anion gap: 17 — ABNORMAL HIGH (ref 5–15)
BUN: 39 mg/dL — ABNORMAL HIGH (ref 8–23)
CO2: 23 mmol/L (ref 22–32)
Calcium: 9.9 mg/dL (ref 8.9–10.3)
Chloride: 97 mmol/L — ABNORMAL LOW (ref 98–111)
Creatinine, Ser: 1.97 mg/dL — ABNORMAL HIGH (ref 0.61–1.24)
GFR, Estimated: 33 mL/min — ABNORMAL LOW
Glucose, Bld: 464 mg/dL — ABNORMAL HIGH (ref 70–99)
Potassium: 3.3 mmol/L — ABNORMAL LOW (ref 3.5–5.1)
Sodium: 137 mmol/L (ref 135–145)
Total Bilirubin: 1.3 mg/dL — ABNORMAL HIGH (ref 0.0–1.2)
Total Protein: 6.4 g/dL — ABNORMAL LOW (ref 6.5–8.1)

## 2024-11-06 LAB — IRON AND TIBC
Iron: 25 ug/dL — ABNORMAL LOW (ref 45–182)
Saturation Ratios: 8 % — ABNORMAL LOW (ref 17.9–39.5)
TIBC: 311 ug/dL (ref 250–450)
UIBC: 286 ug/dL

## 2024-11-06 LAB — LACTIC ACID, PLASMA
Lactic Acid, Venous: 3.7 mmol/L (ref 0.5–1.9)
Lactic Acid, Venous: 3.6 mmol/L (ref 0.5–1.9)
Lactic Acid, Venous: 3.3 mmol/L (ref 0.5–1.9)

## 2024-11-06 LAB — URINALYSIS, W/ REFLEX TO CULTURE (INFECTION SUSPECTED)
Bacteria, UA: NONE SEEN
Bilirubin Urine: NEGATIVE
Glucose, UA: >1000 mg/dL — AB
Hgb urine dipstick: NEGATIVE
Ketones, ur: NEGATIVE mg/dL
Nitrite: NEGATIVE
Protein, ur: 30 mg/dL — AB
Specific Gravity, Urine: 1.013 (ref 1.005–1.030)
pH: 5.5 (ref 5.0–8.0)

## 2024-11-06 LAB — D-DIMER, QUANTITATIVE: D-Dimer, Quant: 1.49 ug{FEU}/mL — ABNORMAL HIGH (ref 0.00–0.50)

## 2024-11-06 LAB — TROPONIN T, HIGH SENSITIVITY
Troponin T High Sensitivity: 141 ng/L (ref 0–19)
Troponin T High Sensitivity: 133 ng/L (ref 0–19)

## 2024-11-06 LAB — PROCALCITONIN: Procalcitonin: 0.25 ng/mL

## 2024-11-06 LAB — I-STAT VENOUS BLOOD GAS, ED
Acid-Base Excess: 0 mmol/L (ref 0.0–2.0)
Bicarbonate: 24.1 mmol/L (ref 20.0–28.0)
Calcium, Ion: 1.1 mmol/L — ABNORMAL LOW (ref 1.15–1.40)
HCT: 32 % — ABNORMAL LOW (ref 39.0–52.0)
Hemoglobin: 10.9 g/dL — ABNORMAL LOW (ref 13.0–17.0)
O2 Saturation: 88 %
Patient temperature: 98.1
Potassium: 3.7 mmol/L (ref 3.5–5.1)
Sodium: 136 mmol/L (ref 135–145)
TCO2: 25 mmol/L (ref 22–32)
pCO2, Ven: 34.4 mmHg — ABNORMAL LOW (ref 44–60)
pH, Ven: 7.452 — ABNORMAL HIGH (ref 7.25–7.43)
pO2, Ven: 50 mmHg — ABNORMAL HIGH (ref 32–45)

## 2024-11-06 LAB — PRO BRAIN NATRIURETIC PEPTIDE: Pro Brain Natriuretic Peptide: >35000 pg/mL — ABNORMAL HIGH

## 2024-11-06 LAB — PROTIME-INR
INR: 1.6 — ABNORMAL HIGH (ref 0.8–1.2)
Prothrombin Time: 20.1 s — ABNORMAL HIGH (ref 11.4–15.2)

## 2024-11-06 LAB — MAGNESIUM: Magnesium: 1.6 mg/dL — ABNORMAL LOW (ref 1.7–2.4)

## 2024-11-06 LAB — LIPASE, BLOOD: Lipase: 23 U/L (ref 11–51)

## 2024-11-06 LAB — GLUCOSE, CAPILLARY: Glucose-Capillary: 398 mg/dL — ABNORMAL HIGH (ref 70–99)

## 2024-11-06 LAB — RETICULOCYTES
Immature Retic Fract: 25.9 % — ABNORMAL HIGH (ref 2.3–15.9)
RBC.: 3.44 MIL/uL — ABNORMAL LOW (ref 4.22–5.81)
Retic Count, Absolute: 95.3 10*3/uL (ref 19.0–186.0)
Retic Ct Pct: 2.8 % (ref 0.4–3.1)

## 2024-11-06 LAB — AMMONIA: Ammonia: <13 umol/L (ref 9–35)

## 2024-11-06 LAB — OCCULT BLOOD X 1 CARD TO LAB, STOOL: Fecal Occult Bld: NEGATIVE

## 2024-11-06 LAB — FERRITIN: Ferritin: 89 ng/mL (ref 24–336)

## 2024-11-06 LAB — PHOSPHORUS: Phosphorus: 2.8 mg/dL (ref 2.5–4.6)

## 2024-11-06 MED ORDER — INSULIN ASPART 100 UNIT/ML IJ SOLN
0.0000 [IU] | INTRAMUSCULAR | Status: AC
  Administered 2024-11-06: 7 [IU] via SUBCUTANEOUS
  Administered 2024-11-07: 1 [IU] via SUBCUTANEOUS
  Administered 2024-11-07: 9 [IU] via SUBCUTANEOUS
  Administered 2024-11-07 – 2024-11-08 (×3): 2 [IU] via SUBCUTANEOUS
  Administered 2024-11-08: 1 [IU] via SUBCUTANEOUS
  Administered 2024-11-08 – 2024-11-09 (×3): 2 [IU] via SUBCUTANEOUS
  Administered 2024-11-09: 1 [IU] via SUBCUTANEOUS
  Administered 2024-11-09 (×2): 2 [IU] via SUBCUTANEOUS
  Filled 2024-11-06 (×2): qty 2
  Filled 2024-11-06: qty 9
  Filled 2024-11-06: qty 1
  Filled 2024-11-06: qty 5
  Filled 2024-11-06 (×2): qty 2
  Filled 2024-11-06: qty 1
  Filled 2024-11-06 (×2): qty 2
  Filled 2024-11-06: qty 9
  Filled 2024-11-06 (×2): qty 2

## 2024-11-06 MED ORDER — AZITHROMYCIN 250 MG PO TABS
500.0000 mg | ORAL_TABLET | Freq: Every day | ORAL | Status: DC
  Administered 2024-11-07 – 2024-11-08 (×2): 500 mg via ORAL
  Filled 2024-11-06 (×2): qty 2

## 2024-11-06 MED ORDER — SODIUM CHLORIDE 0.9 % IV SOLN: INTRAVENOUS | Status: AC | PRN

## 2024-11-06 MED ORDER — SODIUM CHLORIDE 0.9 % IV SOLN
2.0000 g | INTRAVENOUS | Status: AC
  Administered 2024-11-07 – 2024-11-09 (×3): 2 g via INTRAVENOUS
  Filled 2024-11-06 (×3): qty 20

## 2024-11-06 MED ORDER — INSULIN GLARGINE-YFGN 100 UNIT/ML ~~LOC~~ SOLN
5.0000 [IU] | Freq: Every day | SUBCUTANEOUS | Status: AC
  Administered 2024-11-06 – 2024-11-08 (×3): 5 [IU] via SUBCUTANEOUS
  Filled 2024-11-06 (×5): qty 0.05

## 2024-11-06 MED ORDER — ALBUTEROL SULFATE (2.5 MG/3ML) 0.083% IN NEBU: 2.5000 mg | INHALATION_SOLUTION | RESPIRATORY_TRACT | Status: AC | PRN

## 2024-11-06 MED ORDER — SODIUM CHLORIDE 0.9 % IV SOLN: 250.0000 mL | INTRAVENOUS | Status: AC | PRN

## 2024-11-06 MED ORDER — FENTANYL CITRATE (PF) 50 MCG/ML IJ SOSY
12.5000 ug | PREFILLED_SYRINGE | INTRAMUSCULAR | Status: AC | PRN
  Administered 2024-11-09: 50 ug via INTRAVENOUS
  Filled 2024-11-06: qty 1

## 2024-11-06 MED ORDER — SODIUM CHLORIDE 0.9 % IV SOLN
1.0000 g | Freq: Once | INTRAVENOUS | Status: AC
  Administered 2024-11-06: 1 g via INTRAVENOUS
  Filled 2024-11-06: qty 10

## 2024-11-06 MED ORDER — FUROSEMIDE 10 MG/ML IJ SOLN
20.0000 mg | Freq: Once | INTRAMUSCULAR | Status: AC
  Administered 2024-11-06: 20 mg via INTRAVENOUS
  Filled 2024-11-06: qty 2

## 2024-11-06 MED ORDER — ONDANSETRON HCL 4 MG PO TABS: 4.0000 mg | ORAL_TABLET | Freq: Four times a day (QID) | ORAL | Status: DC | PRN

## 2024-11-06 MED ORDER — INSULIN DEGLUDEC 100 UNIT/ML ~~LOC~~ SOPN: 5.0000 [IU] | PEN_INJECTOR | Freq: Every evening | SUBCUTANEOUS | Status: DC

## 2024-11-06 MED ORDER — AZITHROMYCIN 250 MG PO TABS
500.0000 mg | ORAL_TABLET | Freq: Every day | ORAL | Status: DC
  Administered 2024-11-06: 500 mg via ORAL
  Filled 2024-11-06: qty 2

## 2024-11-06 MED ORDER — ACETAMINOPHEN 650 MG RE SUPP: 650.0000 mg | Freq: Four times a day (QID) | RECTAL | Status: AC | PRN

## 2024-11-06 MED ORDER — SODIUM CHLORIDE 0.9% FLUSH
3.0000 mL | Freq: Two times a day (BID) | INTRAVENOUS | Status: AC
  Administered 2024-11-06 – 2024-11-09 (×6): 3 mL via INTRAVENOUS

## 2024-11-06 MED ORDER — MAGNESIUM SULFATE 2 GM/50ML IV SOLN
2.0000 g | Freq: Once | INTRAVENOUS | Status: AC
  Administered 2024-11-06: 2 g via INTRAVENOUS
  Filled 2024-11-06: qty 50

## 2024-11-06 MED ORDER — SODIUM CHLORIDE 0.9% FLUSH
3.0000 mL | INTRAVENOUS | Status: AC | PRN
  Administered 2024-11-08: 3 mL via INTRAVENOUS

## 2024-11-06 MED ORDER — POTASSIUM CHLORIDE 10 MEQ/100ML IV SOLN
10.0000 meq | INTRAVENOUS | Status: AC
  Administered 2024-11-06 – 2024-11-07 (×2): 10 meq via INTRAVENOUS
  Filled 2024-11-06 (×2): qty 100

## 2024-11-06 MED ORDER — ONDANSETRON HCL 4 MG/2ML IJ SOLN
4.0000 mg | Freq: Four times a day (QID) | INTRAMUSCULAR | Status: DC | PRN
  Administered 2024-11-08: 4 mg via INTRAVENOUS
  Filled 2024-11-06: qty 2

## 2024-11-06 MED ORDER — ACETAMINOPHEN 325 MG PO TABS
650.0000 mg | ORAL_TABLET | Freq: Four times a day (QID) | ORAL | Status: AC | PRN
  Administered 2024-11-08: 650 mg via ORAL
  Filled 2024-11-06: qty 2

## 2024-11-06 MED ORDER — PANTOPRAZOLE SODIUM 40 MG IV SOLR
40.0000 mg | Freq: Two times a day (BID) | INTRAVENOUS | Status: AC
  Administered 2024-11-06 – 2024-11-09 (×7): 40 mg via INTRAVENOUS
  Filled 2024-11-06 (×7): qty 10

## 2024-11-06 NOTE — Assessment & Plan Note (Signed)
 Fluids status a bit difficult to assess Patient reports decreased p.o. intake But appears to be third spacing although albumin  is 3.5 We will see how he responds to a dose of Lasix  was given in emergency department

## 2024-11-06 NOTE — Plan of Care (Signed)
 Hospital Medicine Transfer Accept Note Patient Name/Age: Keith Espinoza / 84 y.o. MRN: [id number] Admission Date: 11/06/2024  Once successfully transferred to the appropriate floor, TRH will assume care for the patient above.  A/P: 30M h/o very frail, chronically ill and wheelchair-bound with coronary artery disease (PCI to RCA, LAD, CX in 2004; last cardiac catheterization in July 2024 revealing diffuse coronary artery disease and was turndown for CABG), HFmrEF (EF 40-45% in 05/2024),, HTN, HLD, DM2,PAD s/p R BKA and CKD3 with >3 admission in he last month, who p/w FTT, CAP and HFmrEF exacerbation.  Pt presented to [facility] ED with abd discomfort c/b intractable n/v, as well as SOB/DOE. Labs notable for Cr 1.97 (which is improved from baseline Cr 2.34 on last admission in 10/26/2024), pro-BNP >35,000, and troponin 141. D-dimer 1.49. lactic acid 3.7. CT chest showed RLL large pleural effusion as well consolidation c/f infection, aspiration or atelectasis and CT abd/pelvis showed oderate free fluid and no bowel obstruction. OSH EDP (Dr. Jerrol) requested admission for sepsis iso CAP c/b HFmrEF exacerbation. Pt accepted to PCU and will need IV abd for CAP and eventual diuresis as well as PT/OT eval given c/f FTT.   Marsha Ada, MD Attending Physician Division of [facility] November 06, 2024 1:44 PM

## 2024-11-06 NOTE — Assessment & Plan Note (Signed)
 May benefit from diagnostic/therapeutic thoracentesis to further evaluate

## 2024-11-06 NOTE — Assessment & Plan Note (Signed)
Not on anticoagulation 

## 2024-11-06 NOTE — ED Notes (Signed)
 Unable to get o2 reading, hand are cool to touch. Pt being placed in room

## 2024-11-06 NOTE — ED Notes (Signed)
 Called 3E, requested to call back for report d/t being busy with another admission.

## 2024-11-06 NOTE — Assessment & Plan Note (Signed)
 Nutrition consult

## 2024-11-06 NOTE — Assessment & Plan Note (Signed)
Order sliding scale  

## 2024-11-06 NOTE — Assessment & Plan Note (Signed)
Chronic stable continue Crestor 10 mg a day

## 2024-11-06 NOTE — ED Triage Notes (Signed)
 Pt bib wheelchair, endorses inability to eat, drink d/t lower abd pain with n/v x 1 week. Recently d/c after admission

## 2024-11-06 NOTE — Assessment & Plan Note (Signed)
Continue Crestor 10 mg a day.

## 2024-11-06 NOTE — ED Notes (Signed)
 Called Kim at [facility] for transport 16:52-TC

## 2024-11-06 NOTE — Assessment & Plan Note (Signed)
-  will monitor on tele avoid QT prolonging medications, rehydrate correct electrolytes ? ?

## 2024-11-07 ENCOUNTER — Inpatient Hospital Stay (HOSPITAL_COMMUNITY)

## 2024-11-07 ENCOUNTER — Other Ambulatory Visit: Payer: Self-pay

## 2024-11-07 DIAGNOSIS — N189 Chronic kidney disease, unspecified: Secondary | ICD-10-CM | POA: Diagnosis not present

## 2024-11-07 DIAGNOSIS — I5022 Chronic systolic (congestive) heart failure: Secondary | ICD-10-CM | POA: Diagnosis not present

## 2024-11-07 DIAGNOSIS — J690 Pneumonitis due to inhalation of food and vomit: Secondary | ICD-10-CM | POA: Diagnosis not present

## 2024-11-07 DIAGNOSIS — Z794 Long term (current) use of insulin: Secondary | ICD-10-CM | POA: Diagnosis not present

## 2024-11-07 DIAGNOSIS — R634 Abnormal weight loss: Secondary | ICD-10-CM | POA: Diagnosis not present

## 2024-11-07 DIAGNOSIS — I509 Heart failure, unspecified: Secondary | ICD-10-CM

## 2024-11-07 DIAGNOSIS — R079 Chest pain, unspecified: Secondary | ICD-10-CM

## 2024-11-07 DIAGNOSIS — R1013 Epigastric pain: Secondary | ICD-10-CM | POA: Diagnosis not present

## 2024-11-07 DIAGNOSIS — I2510 Atherosclerotic heart disease of native coronary artery without angina pectoris: Secondary | ICD-10-CM | POA: Diagnosis not present

## 2024-11-07 DIAGNOSIS — R109 Unspecified abdominal pain: Secondary | ICD-10-CM | POA: Diagnosis not present

## 2024-11-07 DIAGNOSIS — E785 Hyperlipidemia, unspecified: Secondary | ICD-10-CM | POA: Diagnosis not present

## 2024-11-07 DIAGNOSIS — R1310 Dysphagia, unspecified: Secondary | ICD-10-CM | POA: Diagnosis not present

## 2024-11-07 DIAGNOSIS — J90 Pleural effusion, not elsewhere classified: Secondary | ICD-10-CM | POA: Diagnosis not present

## 2024-11-07 DIAGNOSIS — R570 Cardiogenic shock: Secondary | ICD-10-CM

## 2024-11-07 DIAGNOSIS — E1165 Type 2 diabetes mellitus with hyperglycemia: Secondary | ICD-10-CM | POA: Diagnosis not present

## 2024-11-07 DIAGNOSIS — R9431 Abnormal electrocardiogram [ECG] [EKG]: Secondary | ICD-10-CM | POA: Diagnosis not present

## 2024-11-07 DIAGNOSIS — I480 Paroxysmal atrial fibrillation: Secondary | ICD-10-CM | POA: Diagnosis not present

## 2024-11-07 DIAGNOSIS — I513 Intracardiac thrombosis, not elsewhere classified: Secondary | ICD-10-CM

## 2024-11-07 LAB — BODY FLUID CELL COUNT WITH DIFFERENTIAL
Eos, Fluid: 0 %
Lymphs, Fluid: 29 %
Monocyte-Macrophage-Serous Fluid: 17 % — ABNORMAL LOW (ref 50–90)
Neutrophil Count, Fluid: 54 % — ABNORMAL HIGH (ref 0–25)
Total Nucleated Cell Count, Fluid: 171 uL (ref 0–1000)

## 2024-11-07 LAB — CBC
HCT: 34.3 % — ABNORMAL LOW (ref 39.0–52.0)
Hemoglobin: 10.5 g/dL — ABNORMAL LOW (ref 13.0–17.0)
MCH: 29.8 pg (ref 26.0–34.0)
MCHC: 30.6 g/dL (ref 30.0–36.0)
MCV: 97.4 fL (ref 80.0–100.0)
Platelets: 99 10*3/uL — ABNORMAL LOW (ref 150–400)
RBC: 3.52 MIL/uL — ABNORMAL LOW (ref 4.22–5.81)
RDW: 17.6 % — ABNORMAL HIGH (ref 11.5–15.5)
WBC: 6.3 10*3/uL (ref 4.0–10.5)
nRBC: 0 % (ref 0.0–0.2)

## 2024-11-07 LAB — FOLATE: Folate: 5.5 ng/mL — ABNORMAL LOW

## 2024-11-07 LAB — GLUCOSE, CAPILLARY
Glucose-Capillary: 325 mg/dL — ABNORMAL HIGH (ref 70–99)
Glucose-Capillary: 132 mg/dL — ABNORMAL HIGH (ref 70–99)
Glucose-Capillary: 101 mg/dL — ABNORMAL HIGH (ref 70–99)
Glucose-Capillary: 108 mg/dL — ABNORMAL HIGH (ref 70–99)
Glucose-Capillary: 174 mg/dL — ABNORMAL HIGH (ref 70–99)
Glucose-Capillary: 177 mg/dL — ABNORMAL HIGH (ref 70–99)

## 2024-11-07 LAB — COOXEMETRY PANEL
Carboxyhemoglobin: 0.9 % (ref 0.5–1.5)
Methemoglobin: <0.7 % (ref 0.0–1.5)
O2 Saturation: 51.6 %
Total hemoglobin: 9.4 g/dL — ABNORMAL LOW (ref 12.0–16.0)

## 2024-11-07 LAB — ECHOCARDIOGRAM COMPLETE
AR max vel: 1.35 cm2
AV Area VTI: 1.35 cm2
AV Area mean vel: 1.27 cm2
AV Mean grad: 7.5 mmHg
AV Peak grad: 11.2 mmHg
Ao pk vel: 1.67 m/s
Area-P 1/2: 6.93 cm2
Calc EF: 17.6 %
Est EF: <20
Height: 69 in
MV M vel: 4.3 m/s
MV Peak grad: 74 mmHg
MV VTI: 0.87 cm2
Radius: 0.61 cm
S' Lateral: 4.7 cm
Single Plane A2C EF: 13.7 %
Single Plane A4C EF: 22.9 %
Weight: 2402.13 [oz_av]

## 2024-11-07 LAB — COMPREHENSIVE METABOLIC PANEL WITH GFR
ALT: 17 U/L (ref 0–44)
AST: 16 U/L (ref 15–41)
Albumin: 3.4 g/dL — ABNORMAL LOW (ref 3.5–5.0)
Alkaline Phosphatase: 110 U/L (ref 38–126)
Anion gap: 17 — ABNORMAL HIGH (ref 5–15)
BUN: 42 mg/dL — ABNORMAL HIGH (ref 8–23)
CO2: 21 mmol/L — ABNORMAL LOW (ref 22–32)
Calcium: 9.5 mg/dL (ref 8.9–10.3)
Chloride: 100 mmol/L (ref 98–111)
Creatinine, Ser: 1.89 mg/dL — ABNORMAL HIGH (ref 0.61–1.24)
GFR, Estimated: 35 mL/min — ABNORMAL LOW
Glucose, Bld: 361 mg/dL — ABNORMAL HIGH (ref 70–99)
Potassium: 3 mmol/L — ABNORMAL LOW (ref 3.5–5.1)
Sodium: 137 mmol/L (ref 135–145)
Total Bilirubin: 1.1 mg/dL (ref 0.0–1.2)
Total Protein: 6.3 g/dL — ABNORMAL LOW (ref 6.5–8.1)

## 2024-11-07 LAB — MAGNESIUM: Magnesium: 1.8 mg/dL (ref 1.7–2.4)

## 2024-11-07 LAB — BASIC METABOLIC PANEL WITH GFR
Anion gap: 15 (ref 5–15)
BUN: 42 mg/dL — ABNORMAL HIGH (ref 8–23)
CO2: 23 mmol/L (ref 22–32)
Calcium: 8.8 mg/dL — ABNORMAL LOW (ref 8.9–10.3)
Chloride: 99 mmol/L (ref 98–111)
Creatinine, Ser: 1.87 mg/dL — ABNORMAL HIGH (ref 0.61–1.24)
GFR, Estimated: 35 mL/min — ABNORMAL LOW
Glucose, Bld: 207 mg/dL — ABNORMAL HIGH (ref 70–99)
Potassium: 3.1 mmol/L — ABNORMAL LOW (ref 3.5–5.1)
Sodium: 138 mmol/L (ref 135–145)

## 2024-11-07 LAB — URINALYSIS, COMPLETE (UACMP) WITH MICROSCOPIC
Bilirubin Urine: NEGATIVE
Glucose, UA: >=500 mg/dL — AB
Hgb urine dipstick: NEGATIVE
Ketones, ur: NEGATIVE mg/dL
Nitrite: NEGATIVE
Protein, ur: 30 mg/dL — AB
Specific Gravity, Urine: 1.011 (ref 1.005–1.030)
pH: 5 (ref 5.0–8.0)

## 2024-11-07 LAB — HEMOGLOBIN A1C
Hgb A1c MFr Bld: 8.7 % — ABNORMAL HIGH (ref 4.8–5.6)
Mean Plasma Glucose: 202.99 mg/dL

## 2024-11-07 LAB — PREALBUMIN: Prealbumin: 8 mg/dL — ABNORMAL LOW (ref 18–38)

## 2024-11-07 LAB — LACTIC ACID, PLASMA
Lactic Acid, Venous: 3.5 mmol/L (ref 0.5–1.9)
Lactic Acid, Venous: 4.9 mmol/L (ref 0.5–1.9)
Lactic Acid, Venous: 4.7 mmol/L (ref 0.5–1.9)
Lactic Acid, Venous: 2.3 mmol/L (ref 0.5–1.9)
Lactic Acid, Venous: 2.3 mmol/L (ref 0.5–1.9)

## 2024-11-07 LAB — VITAMIN B12: Vitamin B-12: >4000 pg/mL — ABNORMAL HIGH (ref 180–914)

## 2024-11-07 LAB — PHOSPHORUS: Phosphorus: 2.5 mg/dL (ref 2.5–4.6)

## 2024-11-07 LAB — STREP PNEUMONIAE URINARY ANTIGEN: Strep Pneumo Urinary Antigen: NEGATIVE

## 2024-11-07 MED ORDER — LIDOCAINE-EPINEPHRINE 1 %-1:100000 IJ SOLN
INTRAMUSCULAR | Status: AC
  Filled 2024-11-07: qty 20

## 2024-11-07 MED ORDER — AMIODARONE LOAD VIA INFUSION
150.0000 mg | Freq: Once | INTRAVENOUS | Status: AC
  Administered 2024-11-08: 150 mg via INTRAVENOUS
  Filled 2024-11-07: qty 83.34

## 2024-11-07 MED ORDER — FUROSEMIDE 10 MG/ML IJ SOLN: 120.0000 mg | Freq: Once | INTRAVENOUS | Status: DC

## 2024-11-07 MED ORDER — POTASSIUM CHLORIDE CRYS ER 20 MEQ PO TBCR
40.0000 meq | EXTENDED_RELEASE_TABLET | Freq: Once | ORAL | Status: AC
  Administered 2024-11-08: 40 meq via ORAL
  Filled 2024-11-07: qty 2

## 2024-11-07 MED ORDER — HEPARIN BOLUS VIA INFUSION
3000.0000 [IU] | Freq: Once | INTRAVENOUS | Status: AC
  Administered 2024-11-07: 3000 [IU] via INTRAVENOUS
  Filled 2024-11-07: qty 3000

## 2024-11-07 MED ORDER — DOBUTAMINE-DEXTROSE 4-5 MG/ML-% IV SOLN
7.5000 ug/kg/min | INTRAVENOUS | Status: AC
  Administered 2024-11-07: 2.5 ug/kg/min via INTRAVENOUS
  Administered 2024-11-09: 7.5 ug/kg/min via INTRAVENOUS
  Filled 2024-11-07 (×3): qty 250

## 2024-11-07 MED ORDER — FUROSEMIDE 10 MG/ML IJ SOLN
80.0000 mg | Freq: Once | INTRAMUSCULAR | Status: AC
  Administered 2024-11-08: 80 mg via INTRAVENOUS
  Filled 2024-11-07: qty 8

## 2024-11-07 MED ORDER — ALBUMIN HUMAN 25 % IV SOLN
25.0000 g | Freq: Once | INTRAVENOUS | Status: AC
  Administered 2024-11-07: 25 g via INTRAVENOUS
  Filled 2024-11-07: qty 100

## 2024-11-07 MED ORDER — AMIODARONE HCL IN DEXTROSE 360-4.14 MG/200ML-% IV SOLN
30.0000 mg/h | INTRAVENOUS | Status: AC
  Administered 2024-11-08 – 2024-11-09 (×3): 30 mg/h via INTRAVENOUS
  Filled 2024-11-07 (×6): qty 200

## 2024-11-07 MED ORDER — AMIODARONE HCL IN DEXTROSE 360-4.14 MG/200ML-% IV SOLN
60.0000 mg/h | INTRAVENOUS | Status: AC
  Administered 2024-11-08 (×2): 60 mg/h via INTRAVENOUS
  Filled 2024-11-07: qty 200

## 2024-11-07 MED ORDER — LIDOCAINE-EPINEPHRINE 1 %-1:100000 IJ SOLN: 20.0000 mL | Freq: Once | INTRAMUSCULAR | Status: AC

## 2024-11-07 MED ORDER — HEPARIN (PORCINE) 25000 UT/250ML-% IV SOLN
1150.0000 [IU]/h | INTRAVENOUS | Status: AC
  Administered 2024-11-07 – 2024-11-08 (×2): 1050 [IU]/h via INTRAVENOUS
  Administered 2024-11-09: 1150 [IU]/h via INTRAVENOUS
  Filled 2024-11-07 (×3): qty 250

## 2024-11-07 MED ORDER — PERFLUTREN LIPID MICROSPHERE
1.0000 mL | INTRAVENOUS | Status: AC | PRN
  Administered 2024-11-07: 2 mL via INTRAVENOUS

## 2024-11-07 MED ORDER — SODIUM CHLORIDE 0.9% FLUSH
10.0000 mL | Freq: Two times a day (BID) | INTRAVENOUS | Status: AC
  Administered 2024-11-07: 30 mL
  Administered 2024-11-08: 10 mL
  Administered 2024-11-08: 30 mL
  Administered 2024-11-09 (×2): 10 mL

## 2024-11-07 MED ORDER — SODIUM CHLORIDE 0.9% FLUSH: 10.0000 mL | INTRAVENOUS | Status: AC | PRN

## 2024-11-07 MED ORDER — CHLORHEXIDINE GLUCONATE CLOTH 2 % EX PADS
6.0000 | MEDICATED_PAD | Freq: Every day | CUTANEOUS | Status: AC
  Administered 2024-11-08 – 2024-11-09 (×2): 6 via TOPICAL

## 2024-11-07 MED ORDER — MAGNESIUM SULFATE 2 GM/50ML IV SOLN
2.0000 g | Freq: Once | INTRAVENOUS | Status: AC
  Administered 2024-11-08: 2 g via INTRAVENOUS
  Filled 2024-11-07: qty 50

## 2024-11-07 NOTE — Consult Note (Addendum)
 "  Referring Provider: Dr. Silvester Primary Care Physician:  Charlott Dorn LABOR, MD Primary Gastroenterologist:  Dr. San  Reason for Consultation: Weight loss, nausea, vomiting, abdominal pain  HPI: Keith Espinoza is a 84 y.o. 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  Pt presented to [facility] ED with abd discomfort c/b intractable n/v, unable to tolerate p.o. for about a week as well as SOB/DOE.     Patient found to have moderate to large right pleural effusion and moderate volume of free fluid in the abdomen and pelvis.  He underwent a 1 L thoracentesis today.  Fluid studies sent and most are in process.  He also had an echo today and just got done working with physical therapy.  He tells me that he does not really want to speak with anybody right now because he is tired and worn out.  Does not give me much information.  Tells me that he has been having abdominal pain and dry heaves for the past 2 to 3 weeks.  He denies any abdominal pain currently.  Sips of water last night did not bother him.  Says that he has been having fairly regular bowel movements at home.  Tells me that the abdominal pain was in the mid to lower abdomen near the umbilicus and would radiate out.  Says that he does not notice his breathing is much better even after the paracentesis.  With blood cell count normal, hemoglobin 10.5 g, platelets 99K, potassium 3.0, creatinine 1.89, LFTs normal, phosphorus and mag normal, hemoglobin A1c 8.7, folate 5.5, vitamin B12 greater than 4000, prealbumin 8, ferritin normal at 89, but has low iron  and iron  saturations  RUQ abdominal ultrasound: IMPRESSION: 1. No sonographic evidence of  biliary obstruction status post cholecystectomy. 2. Small volume upper abdominal free fluid. 3. Small right pleural effusion. 4. Evaluation is limited by overlying bowel gas.  Noncontrast CT scan of the abdomen and pelvis: IMPRESSION: 1. Moderate to large right pleural effusion with right lobe atelectasis versus infiltrate. 2. Moderate volume of free fluid in the abdomen and pelvis. 3. No evidence of bowel obstruction.    Colonoscopy 07/2023: - Diverticulosis in the sigmoid colon, in the descending colon and in the ascending colon. - Lipomatous ileocecal valve. - Normal mucosa in the entire examined colon. - The distal rectum and anal verge are normal on retroflexion view. - The examined portion of the ileum was normal. - No specimens collected.  EGD 07/2023: - Normal esophagus. - A single bleeding angioectasia in the stomach. Treated with argon plasma coagulation ( APC) . Clip was placed. Clip manufacturer: [employer]. - Gastritis. Treated with argon plasma coagulation ( APC) . - Gastritis. - Normal examined duodenum. - No specimens collected.  While there was the active bleeding gastric AVM that was successfully treated endoscopically today, he does remain at risk for diffuse gastric mucosal bruise from gastritis while on antiplatelet therapy. If planning to continue Plavix , would recommend continuing PPI long- term as well.  Past Medical History:  Diagnosis Date   Allergic rhinitis    Anemia    Aortic stenosis    Arthritis    Basal cell carcinoma 11/01/2019    bcc left chest treatment TX [id number]    Below-knee  amputation of right lower extremity (HCC) 12/12/2020   CHF (congestive heart failure) (HCC)    Chronic leg pain    right   Chronic lower back pain    Coronary artery disease    a. Stenting to RCA 2004; staged DES to LAD and Cx 2004. DES to [facility] 2012. b. DES to mCx, PTCA to dCx 11/2011. c. Lateral wall MI 2013 s/p PTCA to distal Cx & DES to mid OM2 11/2011. d. Low risk  nuc 04/2014, EF wnl.   COVID-19    Dehiscence of amputation stump (HCC)    Diabetes mellitus    Insulin  dependent   Diabetic neuropathy (HCC)    MILD   Diverticulosis    Dysrhythmia    Edema of both lower extremities 07/05/2023   Fecal impaction (HCC) 02/01/2021   Bertrum syndrome    Gout    right wrist; right foot; right elbow; have had it since 1970's   H/O hiatal hernia    Heart murmur    Hip dislocation, right (HCC) 04/13/2019   History of echocardiogram    aortic sclerosis per echo 12/09 EF 65%, otherwise normal   History of hemorrhoids    BLEEDING   History of kidney stones    h/o   Hypertension    Diagnosed 1995    Myocardial infarction [facility])    Pancreatic pseudocyst    a. s/p remote drainage 2006.   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   Status post percutaneous transluminal coronary angioplasty 01/06/2021   Thrombocytopenia    Seen on oldest labs in system from 2004   Vertigo    Vitamin B 12 deficiency    orally replaced    Past Surgical History:  Procedure Laterality Date   ABDOMINAL AORTOGRAM W/LOWER EXTREMITY Bilateral 08/08/2020   Procedure: ABDOMINAL AORTOGRAM W/LOWER EXTREMITY;  Surgeon: Eliza Lonni RAMAN, MD;  Location: [facility] INVASIVE CV LAB;  Service: Cardiovascular;  Laterality: Bilateral;   AMPUTATION Right 12/12/2020   Procedure: RIGHT BELOW KNEE AMPUTATION;  Surgeon: Harden Jerona GAILS, MD;  Location: [facility] OR;  Service: Orthopedics;  Laterality: Right;   BACK SURGERY     total of 3 times S/P fall    CARPAL TUNNEL RELEASE Bilateral    CHOLECYSTECTOMY  1990's   CIRCUMCISION N/A 10/12/2023   Procedure: Dorsal slit;  Surgeon: [name], MD;  Location: [facility] ORS;  Service: Urology;  Laterality: N/A;   COLONOSCOPY     COLONOSCOPY Left 07/08/2023   Procedure: COLONOSCOPY;  Surgeon: San Sandor GAILS, DO;  Location: [facility] ENDOSCOPY;  Service: Gastroenterology;  Laterality: Left;   CORONARY ANGIOPLASTY  11/11/11   CORONARY ANGIOPLASTY WITH STENT PLACEMENT  09/30/2011   1 then; makes a total of 4   CORONARY ANGIOPLASTY WITH STENT PLACEMENT  11/11/11   1; makes a total of 5   ESOPHAGOGASTRODUODENOSCOPY Left 07/08/2023   Procedure: ESOPHAGOGASTRODUODENOSCOPY (EGD);  Surgeon: San Sandor GAILS, DO;  Location: [facility] ENDOSCOPY;  Service: Gastroenterology;  Laterality: Left;   HEMOSTASIS CLIP PLACEMENT  07/08/2023   Procedure: HEMOSTASIS CLIP PLACEMENT;  Surgeon: San Sandor GAILS, DO;  Location: [facility] ENDOSCOPY;  Service: Gastroenterology;;   HOT HEMOSTASIS N/A 07/08/2023   Procedure: HOT HEMOSTASIS (ARGON PLASMA COAGULATION/BICAP);  Surgeon: San Sandor GAILS, DO;  Location: [facility] ENDOSCOPY;  Service: Gastroenterology;  Laterality: N/A;   INGUINAL HERNIA REPAIR  2003  right   IR THORACENTESIS RIGHT ASP PLEURAL SPACE W/IMG GUIDE  11/07/2024   JOINT REPLACEMENT Right 04/03/2002   hip replacment   KNEE ARTHROSCOPY  1990's   left   LEFT HEART CATH AND CORONARY ANGIOGRAPHY N/A 04/25/2023   Procedure: LEFT HEART CATH AND CORONARY ANGIOGRAPHY;  Surgeon: Anner Alm ORN, MD;  Location: [facility] INVASIVE CV LAB;  Service: Cardiovascular;  Laterality: N/A;   LEFT HEART CATHETERIZATION WITH CORONARY ANGIOGRAM N/A 09/30/2011   Procedure: LEFT HEART CATHETERIZATION WITH CORONARY ANGIOGRAM;  Surgeon: Candyce GORMAN Reek, MD;  Location: [facility] CATH LAB;  Service: Cardiovascular;  Laterality: N/A;  possible PCI   LEFT HEART CATHETERIZATION WITH CORONARY ANGIOGRAM N/A 11/15/2011   Procedure: LEFT HEART CATHETERIZATION WITH CORONARY ANGIOGRAM;  Surgeon: Candyce GORMAN Reek, MD;  Location: [facility] CATH LAB;  Service: Cardiovascular;  Laterality: N/A;   PERCUTANEOUS CORONARY STENT INTERVENTION (PCI-S)  09/30/2011   Procedure: PERCUTANEOUS CORONARY STENT INTERVENTION (PCI-S);  Surgeon: Candyce GORMAN Reek, MD;  Location: [facility] CATH LAB;   Service: Cardiovascular;;   PERCUTANEOUS CORONARY STENT INTERVENTION (PCI-S) N/A 11/11/2011   Procedure: PERCUTANEOUS CORONARY STENT INTERVENTION (PCI-S);  Surgeon: Candyce GORMAN Reek, MD;  Location: [facility] CATH LAB;  Service: Cardiovascular;  Laterality: N/A;   PERIPHERAL VASCULAR BALLOON ANGIOPLASTY Right 08/08/2020   Procedure: PERIPHERAL VASCULAR BALLOON ANGIOPLASTY;  Surgeon: Eliza Lonni GORMAN, MD;  Location: [facility] INVASIVE CV LAB;  Service: Cardiovascular;  Laterality: Right;  Posterior tibial    SHOULDER SURGERY Right    X 2   STUMP REVISION Right 01/09/2021   Procedure: REVISION RIGHT BELOW KNEE AMPUTATION;  Surgeon: Harden Jerona GAILS, MD;  Location: [facility] OR;  Service: Orthopedics;  Laterality: Right;   TONSILLECTOMY  ~ 1948   TOTAL HIP REVISION Right 04/13/2019   Procedure: RIGHT TOTAL HIP REVISION-POSTERIOR  APPROACH LATERAL;  Surgeon: Barbarann Oneil BROCKS, MD;  Location: [facility] OR;  Service: Orthopedics;  Laterality: Right;   TOTAL KNEE ARTHROPLASTY Left 07/23/2016   Procedure: LEFT TOTAL KNEE ARTHROPLASTY;  Surgeon: Oneil BROCKS Barbarann, MD;  Location: [facility] OR;  Service: Orthopedics;  Laterality: Left;    Prior to Admission medications  Medication Sig Start Date End Date Taking? Authorizing Provider  allopurinol  (ZYLOPRIM ) 100 MG tablet Take 0.5 tablets (50 mg total) by mouth every other day. 09/25/24 11/07/24 Yes Sigdel, Santosh, MD  Ascorbic Acid (VITAMIN C PO) Take 1 tablet by mouth at bedtime.   Yes [provider]  Cholecalciferol  (VITAMIN D -3 PO) Take 1 capsule by mouth at bedtime.   Yes [provider]  clopidogrel  (PLAVIX ) 75 MG tablet Take 1 tablet (75 mg total) by mouth daily. 09/21/24  Yes Conte, Tessa N, PA-C  Cyanocobalamin  (VITAMIN B-12 PO) Take 1 tablet by mouth daily.   Yes [provider]  dicyclomine  (BENTYL ) 10 MG capsule Take 1 capsule (10 mg total) by mouth 3 (three) times daily as needed for spasms. 09/19/24 11/07/24 Yes Regalado, Belkys A, MD  furosemide  (LASIX ) 20  MG tablet Take 1 tablet (20 mg total) by mouth daily. 10/12/24  Yes Clegg, Amy D, NP  gabapentin  (NEURONTIN ) 300 MG capsule Take 1 capsule (300 mg total) by mouth at bedtime. Patient taking differently: Take 300 mg by mouth daily. 09/19/24 11/07/24 Yes Regalado, Belkys A, MD  insulin  aspart (NOVOLOG  FLEXPEN) 100 UNIT/ML FlexPen Max daily 30 units Patient taking differently: Inject into the skin in the morning and at bedtime. 06/08/24  Yes [name], [name], MD  insulin  degludec (TRESIBA  FLEXTOUCH) 100 UNIT/ML FlexTouch Pen Inject 5 Units  into the skin daily. Patient taking differently: Inject into the skin at bedtime as needed (high bs). 09/19/24  Yes Regalado, Belkys A, MD  MAGNESIUM  PO Take 1 tablet by mouth at bedtime.   Yes [provider]  meclizine  (ANTIVERT ) 12.5 MG tablet Take 1 tablet (12.5 mg total) by mouth daily as needed for dizziness. Patient taking differently: Take 12.5 mg by mouth daily. 10/06/24  Yes Laurence Locus, DO  metFORMIN  (GLUCOPHAGE ) 1000 MG tablet Take 1,000 mg by mouth daily.   Yes [provider]  nitroGLYCERIN  (NITROSTAT ) 0.4 MG SL tablet Place 1 tablet (0.4 mg total) under the tongue every 5 (five) minutes x 3 doses as needed for chest pain. 04/16/24  Yes [name], MD  pantoprazole  (PROTONIX ) 40 MG tablet Take 1 tablet (40 mg total) by mouth 2 (two) times daily. 09/19/24  Yes Regalado, Belkys A, MD  rosuvastatin  (CRESTOR ) 10 MG tablet Take 1 tablet (10 mg total) by mouth daily. 10/27/24 11/26/24 Yes Pahwani, Fredia, MD  sucralfate  (CARAFATE ) 1 GM/10ML suspension Take 10 mLs (1 g total) by mouth 2 (two) times daily. 09/19/24  Yes Regalado, Belkys A, MD  terazosin  (HYTRIN ) 5 MG capsule Take 5 mg by mouth 2 (two) times daily.   Yes [provider]  TURMERIC CURCUMIN PO Take 1 tablet by mouth at bedtime.   Yes [provider]  Continuous Glucose Sensor (DEXCOM G7 SENSOR) MISC 1 Device by Does not apply route as directed. 09/10/24    [name], [name], MD  Insulin  Pen Needle 32G X 4 MM MISC 1 Device by Does not apply route in the morning, at noon, in the evening, and at bedtime. 06/08/24   [name], [name], MD    Current Facility-Administered Medications  Medication Dose Route Frequency Provider Last Rate Last Admin   0.9 %  sodium chloride  infusion   Intravenous PRN Doutova, Anastassia, MD   Stopped at 11/06/24 1337   0.9 %  sodium chloride  infusion  250 mL Intravenous PRN Doutova, Anastassia, MD       acetaminophen  (TYLENOL ) tablet 650 mg  650 mg Oral Q6H PRN Doutova, Anastassia, MD       Or   acetaminophen  (TYLENOL ) suppository 650 mg  650 mg Rectal Q6H PRN Doutova, Anastassia, MD       albuterol  (PROVENTIL ) (2.5 MG/3ML) 0.083% nebulizer solution 2.5 mg  2.5 mg Nebulization Q2H PRN Doutova, Anastassia, MD       azithromycin  (ZITHROMAX ) tablet 500 mg  500 mg Oral Daily Doutova, Anastassia, MD   500 mg at 11/07/24 0957   cefTRIAXone  (ROCEPHIN ) 2 g in sodium chloride  0.9 % 100 mL IVPB  2 g Intravenous Q24H Doutova, Anastassia, MD 200 mL/hr at 11/07/24 1151 2 g at 11/07/24 1151   fentaNYL  (SUBLIMAZE ) injection 12.5-50 mcg  12.5-50 mcg Intravenous Q2H PRN Doutova, Anastassia, MD       insulin  aspart (novoLOG ) injection 0-9 Units  0-9 Units Subcutaneous Q4H Doutova, Anastassia, MD   1 Units at 11/07/24 0959   insulin  glargine-yfgn injection 5 Units  5 Units Subcutaneous QHS Chen, Lydia D, RPH   5 Units at 11/06/24 2355   lidocaine -EPINEPHrine  (XYLOCAINE  W/EPI) 1 %-1:100000 (with pres) injection 20 mL  20 mL Intradermal Once Davenport, Kristi B, NP       ondansetron  (ZOFRAN ) tablet 4 mg  4 mg Oral Q6H PRN Doutova, Anastassia, MD       Or   ondansetron  (ZOFRAN ) injection 4 mg  4 mg Intravenous Q6H PRN Doutova,  Anastassia, MD       pantoprazole  (PROTONIX ) injection 40 mg  40 mg Intravenous Q12H Doutova, Anastassia, MD   40 mg at 11/07/24 0957   perflutren  lipid microspheres (DEFINITY ) IV suspension  1-10 mL  Intravenous PRN Lue Elsie BROCKS, MD   2 mL at 11/07/24 1113   sodium chloride  flush (NS) 0.9 % injection 3 mL  3 mL Intravenous Q12H Doutova, Anastassia, MD   3 mL at 11/07/24 1001   sodium chloride  flush (NS) 0.9 % injection 3 mL  3 mL Intravenous PRN Doutova, Anastassia, MD        Allergies as of 11/06/2024 - Review Complete 11/06/2024  Allergen Reaction Noted   Zetia [ezetimibe] Other (See Comments) 10/01/2013   Zocor [simvastatin] Other (See Comments) 10/01/2013   Dilaudid  [hydromorphone  hcl] Other (See Comments) 08/02/2016   Entresto  [sacubitril -valsartan ] Other (See Comments) 09/24/2024   Imdur  [isosorbide  nitrate] Other (See Comments) 09/24/2024   Toprol  xl [metoprolol ] Other (See Comments) 09/24/2024    Family History  Problem Relation Age of Onset   Diabetes Mother    Hyperlipidemia Mother    Hypertension Mother    Cancer Father    Hypertension Father    Diabetes Sister    Hypertension Sister    Cancer Brother    Heart attack Neg Hx     Social History   Socioeconomic History   Marital status: Widowed    Spouse name: Not on file   Number of children: 1   Years of education: Not on file   Highest education level: High school graduate  Occupational History   Occupation: Retired  Tobacco Use   Smoking status: Former    Types: Cigarettes   Smokeless tobacco: Current    Types: Chew   Tobacco comments:    quit 60 years ago  Vaping Use   Vaping status: Never Used  Substance and Sexual Activity   Alcohol use: No   Drug use: No   Sexual activity: Not Currently  Other Topics Concern   Not on file  Social History Narrative   Not on file   Social Drivers of Health   Tobacco Use: High Risk (11/06/2024)   Patient History    Smoking Tobacco Use: Former    Smokeless Tobacco Use: Current    Passive Exposure: Not on Actuary Strain: Low Risk (09/24/2024)   Overall Financial Resource Strain (CARDIA)    Difficulty of Paying Living Expenses: Not  hard at all  Food Insecurity: No Food Insecurity (10/23/2024)   Epic    Worried About Radiation Protection Practitioner of Food in the Last Year: Never true    Ran Out of Food in the Last Year: Never true  Transportation Needs: No Transportation Needs (10/23/2024)   Epic    Lack of Transportation (Medical): No    Lack of Transportation (Non-Medical): No  Physical Activity: Not on file  Stress: No Stress Concern Present (06/12/2023)   Received from [facility] - Occupational Stress Questionnaire    Feeling of Stress : Not at all  Social Connections: Socially Isolated (10/23/2024)   Social Connection and Isolation Panel    Frequency of Communication with Friends and Family: Three times a week    Frequency of Social Gatherings with Friends and Family: Twice a week    Attends Religious Services: Never    Database Administrator or Organizations: No    Attends Banker Meetings: Never    Marital  Status: Widowed  Intimate Partner Violence: Not At Risk (10/23/2024)   Epic    Fear of Current or Ex-Partner: No    Emotionally Abused: No    Physically Abused: No    Sexually Abused: No  Depression (PHQ2-9): Low Risk (04/21/2022)   Depression (PHQ2-9)    PHQ-2 Score: 0  Alcohol Screen: Low Risk (09/24/2024)   Alcohol Screen    Last Alcohol Screening Score (AUDIT): 0  Housing: Low Risk (10/23/2024)   Epic    Unable to Pay for Housing in the Last Year: No    Number of Times Moved in the Last Year: 0    Homeless in the Last Year: No  Utilities: Not At Risk (10/23/2024)   Epic    Threatened with loss of utilities: No  Health Literacy: Not on file    Review of Systems: ROS is O/W negative except as mentioned in HPI.  Physical Exam: Vital signs in last 24 hours: Temp:  [97.7 F (36.5 C)-98.1 F (36.7 C)] 98.1 F (36.7 C) (02/04 0752) Pulse Rate:  [82-96] 82 (02/04 0752) Resp:  [11-31] 19 (02/04 0752) BP: (76-114)/(58-85) 102/81 (02/04 0800) SpO2:  [90  %-100 %] 90 % (02/04 0800) Weight:  [68.1 kg] 68.1 kg (02/03 1823) Last BM Date : 11/06/24 General:  Alert, Well-developed, well-nourished, pleasant and cooperative in NAD Head:  Normocephalic and atraumatic. Eyes:  Sclera clear, no icterus.  Conjunctiva pink. Ears:  Normal auditory acuity. Mouth:  No deformity or lesions.   Lungs:  Clear throughout to auscultation.  No wheezes, crackles, or rhonchi.  Heart:  Regular rate and rhythm Abdomen:  Soft, non-distended.  BS present.  Non-tender. Msk:  Symmetrical without gross deformities.  Pulses:  Normal pulses noted. Extremities:  Right BKA. Neurologic:  Alert and oriented x 4;  grossly normal neurologically. Skin:  Intact without significant lesions or rashes. Psych:  Alert, somewhat cooperative but would not talk much or give much information.  Intake/Output from previous day: 02/03 0701 - 02/04 0700 In: 101.1 [I.V.:1.1; IV Piggyback:100] Out: 400 [Urine:400]  Lab Results: Recent Labs    11/06/24 1137 11/06/24 1145 11/07/24 0246  WBC 5.3  --  6.3  HGB 9.7* 10.9* 10.5*  HCT 30.7* 32.0* 34.3*  PLT 91*  --  99*   BMET Recent Labs    11/06/24 1137 11/06/24 1145 11/06/24 2105 11/07/24 0246  NA 137 136 137 137  K 3.3* 3.7 3.1* 3.0*  CL 97*  --  97* 100  CO2 23  --  25 21*  GLUCOSE 464*  --  438* 361*  BUN 39*  --  43* 42*  CREATININE 1.97*  --  1.91* 1.89*  CALCIUM  9.9  --  9.3 9.5   LFT Recent Labs    11/07/24 0246  PROT 6.3*  ALBUMIN  3.4*  AST 16  ALT 17  ALKPHOS 110  BILITOT 1.1   PT/INR Recent Labs    11/06/24 1137  LABPROT 20.1*  INR 1.6*   Studies/Results: IR THORACENTESIS RIGHT ASP PLEURAL SPACE W/IMG GUIDE Result Date: 11/07/2024 INDICATION: 84 year old male who is COVID+ presents with right pleural effusion. Received request for diagnostic and therapeutic thoracentesis. This is patient's first thoracentesis. EXAM: ULTRASOUND GUIDED DIAGNOSTIC AND THERAPEUTIC, RIGHT-SIDED THORACENTESIS MEDICATIONS:  8 mL 1% lidocaine  with epinephrine  COMPLICATIONS: None immediate. PROCEDURE: An ultrasound guided thoracentesis was thoroughly discussed with the patient and questions answered. The benefits, risks, alternatives and complications were also discussed. The patient understands and wishes to proceed with  the procedure. Written consent was obtained. Ultrasound was performed to localize and mark an adequate pocket of fluid in the right chest. The area was then prepped and draped in the normal sterile fashion. 1% lidocaine  with epinephrine  was used for local anesthesia. Under ultrasound guidance a 6 Fr Safe-T-Centesis catheter was introduced. Thoracentesis was performed. The catheter was removed and a dressing applied. FINDINGS: A total of approximately 1 liter of clear yellow fluid was removed. Samples were sent to the laboratory as requested by the clinical team. IMPRESSION: Successful ultrasound guided right thoracentesis yielding 1 liter of pleural fluid. Performed by: Kristi Davenport, NP under the supervision of Dr. Juliene Balder Electronically Signed   By: Juliene Balder M.D.   On: 11/07/2024 10:41   DG Chest Port 1 View Result Date: 11/07/2024 EXAM: 1 VIEW XRAY OF THE CHEST 11/07/2024 09:22:00 AM COMPARISON: 11/06/2024 CLINICAL HISTORY: 84 year old male. Status post thoracentesis. 1 liter of right side pleural fluid removed this morning. FINDINGS: LUNGS AND PLEURA: Improved aeration of right lung base with persistent moderate basilar opacities. No pneumothorax. Incidental skin fold artifact in the mid left chest. HEART AND MEDIASTINUM: Aortic atherosclerosis. No acute abnormality of the cardiac and mediastinal silhouettes. BONES AND SOFT TISSUES: Surgical anchors in right humeral head. No acute osseous abnormality. IMPRESSION: 1. Improved right lung ventilation and no pneumothorax following thoracentesis. Electronically signed by: Helayne Hurst MD 11/07/2024 09:34 AM EST RP Workstation: [id number]   CT Chest Wo  Contrast Result Date: 11/06/2024 EXAM: CT CHEST WITHOUT CONTRAST 11/06/2024 11:54:23 AM TECHNIQUE: CT of the chest was performed without the administration of intravenous contrast. Multiplanar reformatted images are provided for review. Automated exposure control, iterative reconstruction, and/or weight based adjustment of the mA/kV was utilized to reduce the radiation dose to as low as reasonably achievable. COMPARISON: None available. CLINICAL HISTORY: Pulm edema, PNA? Pulmonary edema; query pneumonia. FINDINGS: MEDIASTINUM: Heart and pericardium are unremarkable. Coronary artery calcifications noted. No pericardial effusion. The central airways are clear. LYMPH NODES: No mediastinal, hilar or axillary lymphadenopathy. LUNGS AND PLEURA: Moderate large layering right pleural effusion. There is atelectasis and ground glass density in the right lower lobe suggesting potential aspiration or pneumonia superimposed on the atelectasis. No pneumothorax. SOFT TISSUES/BONES: No acute abnormality of the bones or soft tissues. No fracture. UPPER ABDOMEN: Limited images of the upper abdomen demonstrate mild ascites. 2 cm benign adenoma of the left adrenal gland. IMPRESSION: 1. Moderate to large layering right pleural effusion. 2. Right lower lobe atelectasis with ground-glass opacity, which may represent aspiration or pneumonia superimposed on atelectasis. Electronically signed by: Norleen Boxer MD 11/06/2024 12:08 PM EST RP Workstation: [id number]   CT ABDOMEN PELVIS WO CONTRAST Result Date: 11/06/2024 EXAM: CT ABDOMEN AND PELVIS WITHOUT CONTRAST 11/06/2024 11:54:23 AM TECHNIQUE: CT of the abdomen and pelvis was performed without the administration of intravenous contrast. Multiplanar reformatted images are provided for review. Automated exposure control, iterative reconstruction, and/or weight-based adjustment of the mA/kV was utilized to reduce the radiation dose to as low as reasonably achievable. COMPARISON: None  available. CLINICAL HISTORY: Abdominal pain, acute, nonlocalized. Acute, nonlocalized abdominal pain. FINDINGS: LOWER CHEST: Moderate to large right pleural effusion with right lobe atelectasis versus infiltrate. Might exclude right lower lobe infection. LIVER: The liver is unremarkable. GALLBLADDER AND BILE DUCTS: Cholecystectomy. No biliary ductal dilatation. SPLEEN: No acute abnormality. PANCREAS: No acute abnormality. ADRENAL GLANDS: No acute abnormality. KIDNEYS, URETERS AND BLADDER: No stones in the kidneys or ureters. No hydronephrosis. No perinephric or periureteral stranding. Ureters and bladder are  normal. GI AND BOWEL: Stomach demonstrates no acute abnormality. There is no bowel obstruction. PERITONEUM AND RETROPERITONEUM: Moderate volume of free fluid in the abdomen and pelvis. No free air. VASCULATURE: Vascular calcifications of the abdominal aorta. LYMPH NODES: No lymphadenopathy. REPRODUCTIVE ORGANS: No acute abnormality. BONES AND SOFT TISSUES: Bilateral nonobstructing renal calculi. Right hip prosthetic. No acute osseous abnormality. IMPRESSION: 1. Moderate to large right pleural effusion with right lobe atelectasis versus infiltrate. 2. Moderate volume of free fluid in the abdomen and pelvis. 3. No evidence of bowel obstruction. Electronically signed by: Norleen Boxer MD 11/06/2024 12:04 PM EST RP Workstation: [id number]   DG Chest Port 1 View Result Date: 11/06/2024 CLINICAL DATA:  Questionable sepsis. EXAM: PORTABLE CHEST 1 VIEW COMPARISON:  October 05, 2024 FINDINGS: The cardiac silhouette is mildly enlarged and unchanged in size. A coronary artery stent is in place. There is marked severity calcification of the aortic arch. Low lung volumes are noted. Diffusely increased interstitial lung markings are seen with marked severity atelectasis and/or infiltrate noted within the right lung base. A moderate size right-sided pleural effusion is also present. No pneumothorax is identified. Multilevel  degenerative changes seen throughout the thoracic spine. IMPRESSION: 1. Stable cardiomegaly with mild interstitial edema. 2. Marked severity right basilar atelectasis and/or infiltrate. 3. Moderate sized right-sided pleural effusion. Electronically Signed   By: Suzen Dials M.D.   On: 11/06/2024 11:26   IMPRESSION:  Nausea, vomiting, abdominal pain, weight loss, inability to take PO: Patient tells me that this have been going on for about 2 to 3 weeks at home.  No abdominal pain currently.  Patient not providing much information due to being tired and not really wanting to talk today.  Noncontrast CT scan looks okay.  With significantly low EF on today's echo, suspect that maybe some of his GI symptoms are due to his severe heart failure/cardiac issues.  Ascites and pleural effusion: Followed by cardiology to be due to heart failure with EF of 40-45% but repeat ECHO today showing much lower EF of less than 20%.  Does not seem to have any evidence of cirrhosis.  Plan is for thoracentesis to check albumin  and protein levels.  Had one liter removed today.  DM2 with Hgb A1c of 8.7  PAF:  No on anticoagulation.  CAD on Plavix   Hypokalemia with K+ 3.0 today  Chronic normocytic anemia:  Hgb 10.5 grams today.  Iron  studies seem more c/w AOCD.  Heme negative.  EGD and colonoscopy 07/2023.  Low folate level at 5.5  CKD  Thrombocytopenia with platelets of 99K  PLAN: - I suspect that he will need further workup from a cardiology standpoint and treatment of his significant heart failure. - He has been n.p.o. with just some ice chips, I think he is going to have a swallow study performed and pending the results of that would let him eat/drink and see how he tolerates it.  Doubt any urgent plans for other GI evaluation, certainly no endoscopic procedures for now. - Can calculate SAAG once fluid studies available from his thoracentesis, but once again suspect that his fluid accumulation is due to  heart failure.   Harlene BIRCH. Maribel Luis  11/07/2024, 12:12 PM      "

## 2024-11-07 NOTE — Progress Notes (Signed)
 Peripherally Inserted Central Catheter Placement  The IV Nurse has discussed with the patient and/or persons authorized to consent for the patient, the purpose of this procedure and the potential benefits and risks involved with this procedure.  The benefits include less needle sticks, lab draws from the catheter, and the patient may be discharged home with the catheter. Risks include, but not limited to, infection, bleeding, blood clot (thrombus formation), and puncture of an artery; nerve damage and irregular heartbeat and possibility to perform a PICC exchange if needed/ordered by physician.  Alternatives to this procedure were also discussed.  Bard Power PICC patient education guide, fact sheet on infection prevention and patient information card has been provided to patient /or left at bedside.    PICC Placement Documentation  PICC Triple Lumen 11/07/24 Right Basilic 39 cm 0 cm (Active)  Indication for Insertion or Continuance of Line Vasoactive infusions 11/07/24 2210  Exposed Catheter (cm) 0 cm 11/07/24 2210  Site Assessment Clean, Dry, Intact 11/07/24 2210  Lumen #1 Status Flushed;Saline locked;Blood return noted 11/07/24 2210  Lumen #2 Status Flushed;Saline locked;Blood return noted 11/07/24 2210  Lumen #3 Status Flushed;Saline locked;Blood return noted 11/07/24 2210  Dressing Type Transparent;Securing device 11/07/24 2210  Dressing Status Antimicrobial disc/dressing in place;Clean, Dry, Intact 11/07/24 2210  Line Care Connections checked and tightened 11/07/24 2210  Line Adjustment (NICU/IV Team Only) No 11/07/24 2210  Dressing Intervention New dressing;Adhesive placed at insertion site (IV team only) 11/07/24 2210  Dressing Change Due 11/14/24 11/07/24 2210       Bethanee Redondo, Cherene Place 11/07/2024, 10:11 PM

## 2024-11-07 NOTE — Progress Notes (Signed)
 Patient back from thoracentesis. Bandaid to mid right back from thoracentesis. No bleeding or hematoma noted. Patient states not pain at site. Patient remains on 2l [place].

## 2024-11-07 NOTE — Progress Notes (Signed)
 OT Cancellation Note  Patient Details Name: Keith Espinoza MRN: [id number] DOB: Jun 05, 1941   Cancelled Treatment:    Reason Eval/Treat Not Completed: Patient at procedure or test/ unavailable. Pt on way to IR. Will attempt back as able.  Joshua Silvano Dragon 11/07/2024, 7:53 AM

## 2024-11-07 NOTE — Evaluation (Signed)
 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 Stenting to RCA 2004; staged DES to LAD and Cx 2004. DES to [facility] 2012. b. DES to mCx, PTCA to dCx 11/2011. c. Lateral wall MI 2013 s/p PTCA to distal Cx & DES to mid OM2 11/2011. d. Low risk nuc 04/2014, EF wnl.   COVID-19    Dehiscence of amputation stump (HCC)    Diabetes mellitus    Insulin  dependent   Diabetic neuropathy (HCC)    MILD   Diverticulosis    Dysrhythmia    Edema of both lower extremities 07/05/2023   Fecal impaction (HCC) 02/01/2021   Bertrum syndrome    Gout    right wrist; right foot; right elbow; have had it since 1970's   H/O hiatal hernia    Heart murmur    Hip dislocation, right (HCC) 04/13/2019   History of echocardiogram    aortic sclerosis per echo 12/09 EF 65%, otherwise normal   History of hemorrhoids    BLEEDING   History of kidney stones    h/o   Hypertension    Diagnosed 1995    Myocardial infarction [facility])    Pancreatic pseudocyst    a. s/p remote drainage 2006.   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   Status post percutaneous transluminal coronary angioplasty 01/06/2021   Thrombocytopenia    Seen on oldest labs in system from  2004   Vertigo    Vitamin B 12 deficiency    orally replaced   Past Surgical History:  Past Surgical History:  Procedure Laterality Date   ABDOMINAL AORTOGRAM W/LOWER EXTREMITY Bilateral 08/08/2020   Procedure: ABDOMINAL AORTOGRAM W/LOWER EXTREMITY;  Surgeon: Eliza Lonni RAMAN, MD;  Location: [facility] INVASIVE CV LAB;  Service: Cardiovascular;  Laterality: Bilateral;   AMPUTATION Right 12/12/2020   Procedure: RIGHT BELOW KNEE AMPUTATION;  Surgeon: Harden Jerona GAILS, MD;  Location: [facility] OR;  Service: Orthopedics;  Laterality: Right;   BACK SURGERY     total of 3 times S/P fall    CARPAL TUNNEL RELEASE Bilateral    CHOLECYSTECTOMY  1990's   CIRCUMCISION N/A 10/12/2023   Procedure: Dorsal slit;  Surgeon: [name], MD;  Location: [facility] ORS;  Service: Urology;  Laterality: N/A;   COLONOSCOPY     COLONOSCOPY Left 07/08/2023   Procedure: COLONOSCOPY;  Surgeon: San Sandor GAILS, DO;  Location: [facility] ENDOSCOPY;  Service: Gastroenterology;  Laterality: Left;   CORONARY ANGIOPLASTY  11/11/11   CORONARY ANGIOPLASTY WITH STENT PLACEMENT  09/30/2011   1 then; makes a total of 4   CORONARY ANGIOPLASTY WITH STENT PLACEMENT  11/11/11   1; makes a total of  5   ESOPHAGOGASTRODUODENOSCOPY Left 07/08/2023   Procedure: ESOPHAGOGASTRODUODENOSCOPY (EGD);  Surgeon: San Sandor GAILS, DO;  Location: [facility] ENDOSCOPY;  Service: Gastroenterology;  Laterality: Left;   HEMOSTASIS CLIP PLACEMENT  07/08/2023   Procedure: HEMOSTASIS CLIP PLACEMENT;  Surgeon: San Sandor GAILS, DO;  Location: [facility] ENDOSCOPY;  Service: Gastroenterology;;   HOT HEMOSTASIS N/A 07/08/2023   Procedure: HOT HEMOSTASIS (ARGON PLASMA COAGULATION/BICAP);  Surgeon: San Sandor GAILS, DO;  Location: [facility] ENDOSCOPY;  Service: Gastroenterology;  Laterality: N/A;   INGUINAL HERNIA REPAIR  2003   right   IR THORACENTESIS RIGHT ASP PLEURAL SPACE W/IMG GUIDE  11/07/2024   JOINT REPLACEMENT Right 04/03/2002   hip replacment   KNEE ARTHROSCOPY  1990's   left    LEFT HEART CATH AND CORONARY ANGIOGRAPHY N/A 04/25/2023   Procedure: LEFT HEART CATH AND CORONARY ANGIOGRAPHY;  Surgeon: Anner Alm ORN, MD;  Location: [facility] INVASIVE CV LAB;  Service: Cardiovascular;  Laterality: N/A;   LEFT HEART CATHETERIZATION WITH CORONARY ANGIOGRAM N/A 09/30/2011   Procedure: LEFT HEART CATHETERIZATION WITH CORONARY ANGIOGRAM;  Surgeon: Candyce GORMAN Reek, MD;  Location: [facility] CATH LAB;  Service: Cardiovascular;  Laterality: N/A;  possible PCI   LEFT HEART CATHETERIZATION WITH CORONARY ANGIOGRAM N/A 11/15/2011   Procedure: LEFT HEART CATHETERIZATION WITH CORONARY ANGIOGRAM;  Surgeon: Candyce GORMAN Reek, MD;  Location: [facility] CATH LAB;  Service: Cardiovascular;  Laterality: N/A;   PERCUTANEOUS CORONARY STENT INTERVENTION (PCI-S)  09/30/2011   Procedure: PERCUTANEOUS CORONARY STENT INTERVENTION (PCI-S);  Surgeon: Candyce GORMAN Reek, MD;  Location: [facility] CATH LAB;  Service: Cardiovascular;;   PERCUTANEOUS CORONARY STENT INTERVENTION (PCI-S) N/A 11/11/2011   Procedure: PERCUTANEOUS CORONARY STENT INTERVENTION (PCI-S);  Surgeon: Candyce GORMAN Reek, MD;  Location: [facility] CATH LAB;  Service: Cardiovascular;  Laterality: N/A;   PERIPHERAL VASCULAR BALLOON ANGIOPLASTY Right 08/08/2020   Procedure: PERIPHERAL VASCULAR BALLOON ANGIOPLASTY;  Surgeon: Eliza Lonni GORMAN, MD;  Location: [facility] INVASIVE CV LAB;  Service: Cardiovascular;  Laterality: Right;  Posterior tibial    SHOULDER SURGERY Right    X 2   STUMP REVISION Right 01/09/2021   Procedure: REVISION RIGHT BELOW KNEE AMPUTATION;  Surgeon: Harden Jerona GAILS, MD;  Location: [facility] OR;  Service: Orthopedics;  Laterality: Right;   TONSILLECTOMY  ~ 1948   TOTAL HIP REVISION Right 04/13/2019   Procedure: RIGHT TOTAL HIP REVISION-POSTERIOR  APPROACH LATERAL;  Surgeon: Barbarann Oneil BROCKS, MD;  Location: [facility] OR;  Service: Orthopedics;  Laterality: Right;   TOTAL KNEE ARTHROPLASTY Left 07/23/2016   Procedure: LEFT TOTAL KNEE ARTHROPLASTY;  Surgeon: Oneil BROCKS Barbarann, MD;   Location: [facility] OR;  Service: Orthopedics;  Laterality: Left;   HPI:  Dillard Pascal is an 84 y.o. male who presented to the hospital on 11/06/24 with N/V and abdominal pain, poor PO intake. Abdominal pain starts in less than five minutes of him drinking even water. ED workup found mild hyperkalemia, metabolic acidosis. He was started on Rocephin  for presumed UTI. CT abdomen pelvis unremarkable. CT chest showed atelectasis and ground glass density in right lower lobe suggesting potential aspiration or PNA. GI consulted who suspects that some of his GI symptoms may be due to his severe heart failure/cardiac issues. thoracentesis removed approximately 1 liter of clear yellow fluid and CXR following this reported improved aeration of right lung base with persistent moderate basilar opacities. Patient made NPO and swallow evaluation was ordered. PMH: HTN, DM-2, HLD, CAD, CHF, CKD, GERD, hiatal hernia.    Assessment / Plan / Recommendation  Clinical Impression  PO recommendation  is diet as tolerated. SLP will s/o at this time but if further concerns, please order MBS with SLP.  Patient is not currently presenting with clinical s/s of oropharyngeal dysphagia but his symptoms reported by himself and his daughter appear to be esophageal in nature. His swallow initiaion appeared timely and no overt s/s aspiration during or after consecutive straw sips of thin liquids. It does not appear that patient needs an MBS at this point but with his symptoms being more esophageal in nature, he may benefit from an esophagram.   SLP Visit Diagnosis: Dysphagia, unspecified (R13.10)    Aspiration Risk  No limitations    Diet Recommendation Regular;Thin liquid    Medication Administration: Other (Comment) (as tolerated) Supervision: Patient able to self feed Compensations: Slow rate;Small sips/bites Postural Changes: Seated upright at 90 degrees;Remain upright for at least 30 minutes after po intake    Other Recommendations  Oral Care Recommendations: Oral care BID     Swallow Evaluation Recommendations     Assistance Recommended at Discharge    Functional Status Assessment Patient has not had a recent decline in their functional status  Frequency and Duration            Prognosis        Swallow Study   General Date of Onset: 11/06/24 HPI: Oden Lindaman is an 84 y.o. male who presented to the hospital on 11/06/24 with N/V and abdominal pain, poor PO intake. Abdominal pain starts in less than five minutes of him drinking even water. ED workup found mild hyperkalemia, metabolic acidosis. He was started on Rocephin  for presumed UTI. CT abdomen pelvis unremarkable. CT chest showed atelectasis and ground glass density in right lower lobe suggesting potential aspiration or PNA. GI consulted who suspects that some of his GI symptoms may be due to his severe heart failure/cardiac issues. thoracentesis removed approximately 1 liter of clear yellow fluid and CXR following this reported improved aeration of right lung base with persistent moderate basilar opacities. Patient made NPO and swallow evaluation was ordered. PMH: HTN, DM-2, HLD, CAD, CHF, CKD, GERD, hiatal hernia. Type of Study: Bedside Swallow Evaluation Previous Swallow Assessment: during previous admission Diet Prior to this Study: NPO Temperature Spikes Noted: No Respiratory Status: Nasal cannula History of Recent Intubation: No Behavior/Cognition: Alert;Cooperative;Pleasant mood Oral Cavity Assessment: Within Functional Limits Oral Care Completed by SLP: No Oral Cavity - Dentition: Adequate natural dentition Vision: Functional for self-feeding Self-Feeding Abilities: Able to feed self Patient Positioning: Upright in bed Baseline Vocal Quality: Normal Volitional Cough: Strong Volitional Swallow: Able to elicit    Oral/Motor/Sensory Function Overall Oral Motor/Sensory Function: Within functional limits   Ice Chips     Thin Liquid Thin Liquid: Within  functional limits Presentation: Straw;Self Fed    Nectar Thick     Honey Thick     Puree Puree: Not tested   Solid     Solid: Not tested     [name], MA, CCC-SLP Speech Therapy  11/07/2024,4:45 PM

## 2024-11-07 NOTE — Consult Note (Signed)
 "  Cardiology Consultation   Patient ID: Keith Espinoza MRN: [id number]; DOB: 12/12/1940  Admit date: 11/06/2024 Date of Consult: 11/07/2024  PCP:  Charlott Dorn LABOR, MD   [facility] Providers Cardiologist:  Gordy Bergamo, MD        Patient Profile: Keith Espinoza is a 84 y.o. 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 Lue  History of Present Illness: Keith Espinoza reports that over the past couple weeks he has had increasing shortness of breath and lower extremity edema with more recent episode of nausea/vomiting.  He reports that whenever he drinks or eats anything he develops nausea and it comes back up.  He also reports having increased lower extremity edema, SOB, and decreased urinary output.  He presented to the [facility] ED and was found to have worsening renal dysfunction, markedly elevated proBNP, and elevated lactic acid.  CT imaging of his abdomen was negative for bowel obstruction; however, there was evidence of abdominal ascites and CT chest showed a large right sided pleural effusion.  He underwent a thoracentesis of his pleural effusion today.  A TTE was obtained as a part of his workup and revealed a new severely reduced LVEF to <20% and severe RV systolic dysfunction.  There was also concern for possible RV thrombus as well.  Given these findings, cardiology was consulted for evaluation.  At bedside the patient appears lethargic and endorses SOB and swelling.  Denies chest pain, syncope, presyncope, palpitations   Past Medical History:  Diagnosis Date   Allergic rhinitis    Anemia    Aortic stenosis    Arthritis    Basal cell carcinoma 11/01/2019    bcc left chest treatment TX cx3 [id number]    Below-knee amputation of right lower extremity (HCC) 12/12/2020    CHF (congestive heart failure) (HCC)    Chronic leg pain    right   Chronic lower back pain    Coronary artery disease    a. Stenting to RCA 2004; staged DES to LAD and Cx 2004. DES to [facility] 2012. b. DES to mCx, PTCA to dCx 11/2011. c. Lateral wall MI 2013 s/p PTCA to distal Cx & DES to mid OM2 11/2011. d. Low risk nuc 04/2014, EF wnl.   COVID-19    Dehiscence of amputation stump (HCC)    Diabetes mellitus    Insulin  dependent   Diabetic neuropathy (HCC)    MILD   Diverticulosis    Dysrhythmia    Edema of both lower extremities 07/05/2023   Fecal impaction (HCC) 02/01/2021   Bertrum syndrome    Gout    right wrist; right foot; right elbow; have had it since 1970's   H/O hiatal hernia    Heart murmur    Hip dislocation, right (HCC) 04/13/2019   History of echocardiogram    aortic sclerosis per echo 12/09 EF 65%, otherwise normal   History of hemorrhoids    BLEEDING   History of kidney stones    h/o   Hypertension    Diagnosed 1995    Myocardial infarction [facility])    Pancreatic pseudocyst    a. s/p remote drainage 2006.   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/2019   S/P total knee arthroplasty, left  10/26/2016   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 03/16/2017   Status post percutaneous transluminal coronary angioplasty 01/06/2021   Thrombocytopenia    Seen on oldest labs in system from 2004   Vertigo    Vitamin B 12 deficiency    orally replaced    Past Surgical History:  Procedure Laterality Date   ABDOMINAL AORTOGRAM W/LOWER EXTREMITY Bilateral 08/08/2020   Procedure: ABDOMINAL AORTOGRAM W/LOWER EXTREMITY;  Surgeon: Eliza Lonni RAMAN, MD;  Location: [facility] INVASIVE CV LAB;  Service: Cardiovascular;  Laterality: Bilateral;   AMPUTATION Right 12/12/2020   Procedure: RIGHT BELOW KNEE AMPUTATION;  Surgeon: Harden Jerona GAILS, MD;  Location: [facility] OR;  Service: Orthopedics;  Laterality: Right;   BACK SURGERY     total of 3  times S/P fall    CARPAL TUNNEL RELEASE Bilateral    CHOLECYSTECTOMY  1990's   CIRCUMCISION N/A 10/12/2023   Procedure: Dorsal slit;  Surgeon: [name], MD;  Location: [facility] ORS;  Service: Urology;  Laterality: N/A;   COLONOSCOPY     COLONOSCOPY Left 07/08/2023   Procedure: COLONOSCOPY;  Surgeon: San Sandor GAILS, DO;  Location: [facility] ENDOSCOPY;  Service: Gastroenterology;  Laterality: Left;   CORONARY ANGIOPLASTY  11/11/11   CORONARY ANGIOPLASTY WITH STENT PLACEMENT  09/30/2011   1 then; makes a total of 4   CORONARY ANGIOPLASTY WITH STENT PLACEMENT  11/11/11   1; makes a total of 5   ESOPHAGOGASTRODUODENOSCOPY Left 07/08/2023   Procedure: ESOPHAGOGASTRODUODENOSCOPY (EGD);  Surgeon: San Sandor GAILS, DO;  Location: [facility] ENDOSCOPY;  Service: Gastroenterology;  Laterality: Left;   HEMOSTASIS CLIP PLACEMENT  07/08/2023   Procedure: HEMOSTASIS CLIP PLACEMENT;  Surgeon: San Sandor GAILS, DO;  Location: [facility] ENDOSCOPY;  Service: Gastroenterology;;   HOT HEMOSTASIS N/A 07/08/2023   Procedure: HOT HEMOSTASIS (ARGON PLASMA COAGULATION/BICAP);  Surgeon: San Sandor GAILS, DO;  Location: [facility] ENDOSCOPY;  Service: Gastroenterology;  Laterality: N/A;   INGUINAL HERNIA REPAIR  2003   right   IR THORACENTESIS RIGHT ASP PLEURAL SPACE W/IMG GUIDE  11/07/2024   JOINT REPLACEMENT Right 04/03/2002   hip replacment   KNEE ARTHROSCOPY  1990's   left   LEFT HEART CATH AND CORONARY ANGIOGRAPHY N/A 04/25/2023   Procedure: LEFT HEART CATH AND CORONARY ANGIOGRAPHY;  Surgeon: Anner Alm ORN, MD;  Location: [facility] INVASIVE CV LAB;  Service: Cardiovascular;  Laterality: N/A;   LEFT HEART CATHETERIZATION WITH CORONARY ANGIOGRAM N/A 09/30/2011   Procedure: LEFT HEART CATHETERIZATION WITH CORONARY ANGIOGRAM;  Surgeon: Candyce RAMAN Reek, MD;  Location: [facility] CATH LAB;  Service: Cardiovascular;  Laterality: N/A;  possible PCI   LEFT HEART CATHETERIZATION WITH CORONARY ANGIOGRAM N/A 11/15/2011   Procedure: LEFT HEART  CATHETERIZATION WITH CORONARY ANGIOGRAM;  Surgeon: Candyce RAMAN Reek, MD;  Location: [facility] CATH LAB;  Service: Cardiovascular;  Laterality: N/A;   PERCUTANEOUS CORONARY STENT INTERVENTION (PCI-S)  09/30/2011   Procedure: PERCUTANEOUS CORONARY STENT INTERVENTION (PCI-S);  Surgeon: Candyce RAMAN Reek, MD;  Location: [facility] CATH LAB;  Service: Cardiovascular;;   PERCUTANEOUS CORONARY STENT INTERVENTION (PCI-S) N/A 11/11/2011   Procedure: PERCUTANEOUS CORONARY STENT INTERVENTION (PCI-S);  Surgeon: Candyce RAMAN Reek, MD;  Location: [facility] CATH LAB;  Service: Cardiovascular;  Laterality: N/A;   PERIPHERAL VASCULAR BALLOON ANGIOPLASTY Right 08/08/2020   Procedure: PERIPHERAL VASCULAR BALLOON ANGIOPLASTY;  Surgeon: Eliza Lonni RAMAN, MD;  Location: [facility] INVASIVE CV LAB;  Service: Cardiovascular;  Laterality: Right;  Posterior tibial    SHOULDER SURGERY Right    X 2   STUMP REVISION Right 01/09/2021  Procedure: REVISION RIGHT BELOW KNEE AMPUTATION;  Surgeon: Harden Jerona GAILS, MD;  Location: [facility] OR;  Service: Orthopedics;  Laterality: Right;   TONSILLECTOMY  ~ 1948   TOTAL HIP REVISION Right 04/13/2019   Procedure: RIGHT TOTAL HIP REVISION-POSTERIOR  APPROACH LATERAL;  Surgeon: Barbarann Oneil BROCKS, MD;  Location: [facility] OR;  Service: Orthopedics;  Laterality: Right;   TOTAL KNEE ARTHROPLASTY Left 07/23/2016   Procedure: LEFT TOTAL KNEE ARTHROPLASTY;  Surgeon: Oneil BROCKS Barbarann, MD;  Location: [facility] OR;  Service: Orthopedics;  Laterality: Left;     Home Medications:  Prior to Admission medications  Medication Sig Start Date End Date Taking? Authorizing Provider  allopurinol  (ZYLOPRIM ) 100 MG tablet Take 0.5 tablets (50 mg total) by mouth every other day. 09/25/24 11/07/24 Yes Sigdel, Santosh, MD  Ascorbic Acid (VITAMIN C PO) Take 1 tablet by mouth at bedtime.   Yes [provider]  Cholecalciferol  (VITAMIN D -3 PO) Take 1 capsule by mouth at bedtime.   Yes [provider]  clopidogrel  (PLAVIX ) 75 MG tablet Take 1  tablet (75 mg total) by mouth daily. 09/21/24  Yes Conte, Tessa N, PA-C  Cyanocobalamin  (VITAMIN B-12 PO) Take 1 tablet by mouth daily.   Yes [provider]  dicyclomine  (BENTYL ) 10 MG capsule Take 1 capsule (10 mg total) by mouth 3 (three) times daily as needed for spasms. 09/19/24 11/07/24 Yes Regalado, Belkys A, MD  furosemide  (LASIX ) 20 MG tablet Take 1 tablet (20 mg total) by mouth daily. 10/12/24  Yes Clegg, Amy D, NP  gabapentin  (NEURONTIN ) 300 MG capsule Take 1 capsule (300 mg total) by mouth at bedtime. Patient taking differently: Take 300 mg by mouth daily. 09/19/24 11/07/24 Yes Regalado, Belkys A, MD  insulin  aspart (NOVOLOG  FLEXPEN) 100 UNIT/ML FlexPen Max daily 30 units Patient taking differently: Inject into the skin in the morning and at bedtime. 06/08/24  Yes [name], [name], MD  insulin  degludec (TRESIBA  FLEXTOUCH) 100 UNIT/ML FlexTouch Pen Inject 5 Units into the skin daily. Patient taking differently: Inject into the skin at bedtime as needed (high bs). 09/19/24  Yes Regalado, Belkys A, MD  MAGNESIUM  PO Take 1 tablet by mouth at bedtime.   Yes [provider]  meclizine  (ANTIVERT ) 12.5 MG tablet Take 1 tablet (12.5 mg total) by mouth daily as needed for dizziness. Patient taking differently: Take 12.5 mg by mouth daily. 10/06/24  Yes Laurence Locus, DO  metFORMIN  (GLUCOPHAGE ) 1000 MG tablet Take 1,000 mg by mouth daily.   Yes [provider]  nitroGLYCERIN  (NITROSTAT ) 0.4 MG SL tablet Place 1 tablet (0.4 mg total) under the tongue every 5 (five) minutes x 3 doses as needed for chest pain. 04/16/24  Yes [name], MD  pantoprazole  (PROTONIX ) 40 MG tablet Take 1 tablet (40 mg total) by mouth 2 (two) times daily. 09/19/24  Yes Regalado, Belkys A, MD  rosuvastatin  (CRESTOR ) 10 MG tablet Take 1 tablet (10 mg total) by mouth daily. 10/27/24 11/26/24 Yes Pahwani, Fredia, MD  sucralfate  (CARAFATE ) 1 GM/10ML suspension Take 10 mLs (1 g total) by mouth 2 (two) times  daily. 09/19/24  Yes Regalado, Belkys A, MD  terazosin  (HYTRIN ) 5 MG capsule Take 5 mg by mouth 2 (two) times daily.   Yes [provider]  TURMERIC CURCUMIN PO Take 1 tablet by mouth at bedtime.   Yes [provider]  Continuous Glucose Sensor (DEXCOM G7 SENSOR) MISC 1 Device by Does not apply route as directed. 09/10/24   [name], [name], MD  Insulin  Pen Needle 32G X 4 MM MISC 1 Device by Does not apply route in the morning, at noon, in the evening, and at bedtime. 06/08/24   [name], [name], MD    Scheduled Meds:  azithromycin   500 mg Oral Daily   insulin  aspart  0-9 Units Subcutaneous Q4H   insulin  glargine-yfgn  5 Units Subcutaneous QHS   lidocaine -EPINEPHrine   20 mL Intradermal Once   pantoprazole  (PROTONIX ) IV  40 mg Intravenous Q12H   sodium chloride  flush  3 mL Intravenous Q12H   Continuous Infusions:  sodium chloride      cefTRIAXone  (ROCEPHIN )  IV 2 g (11/07/24 1151)   PRN Meds: sodium chloride , acetaminophen  **OR** acetaminophen , albuterol , fentaNYL  (SUBLIMAZE ) injection, ondansetron  **OR** ondansetron  (ZOFRAN ) IV, sodium chloride  flush  Allergies:   Allergies[1]  Social History:   Social History   Socioeconomic History   Marital status: Widowed    Spouse name: Not on file   Number of children: 1   Years of education: Not on file   Highest education level: High school graduate  Occupational History   Occupation: Retired  Tobacco Use   Smoking status: Former    Types: Cigarettes   Smokeless tobacco: Current    Types: Chew   Tobacco comments:    quit 60 years ago  Vaping Use   Vaping status: Never Used  Substance and Sexual Activity   Alcohol use: No   Drug use: No   Sexual activity: Not Currently  Other Topics Concern   Not on file  Social History Narrative   Not on file   Social Drivers of Health   Tobacco Use: High Risk (11/06/2024)   Patient History    Smoking Tobacco Use: Former    Smokeless Tobacco Use:  Current    Passive Exposure: Not on Actuary Strain: Low Risk (09/24/2024)   Overall Financial Resource Strain (CARDIA)    Difficulty of Paying Living Expenses: Not hard at all  Food Insecurity: No Food Insecurity (10/23/2024)   Epic    Worried About Radiation Protection Practitioner of Food in the Last Year: Never true    Ran Out of Food in the Last Year: Never true  Transportation Needs: No Transportation Needs (10/23/2024)   Epic    Lack of Transportation (Medical): No    Lack of Transportation (Non-Medical): No  Physical Activity: Not on file  Stress: No Stress Concern Present (06/12/2023)   Received from [facility] of Occupational Health - Occupational Stress Questionnaire    Feeling of Stress : Not at all  Social Connections: Socially Isolated (10/23/2024)   Social Connection and Isolation Panel    Frequency of Communication with Friends and Family: Three times a week    Frequency of Social Gatherings with Friends and Family: Twice a week    Attends Religious Services: Never    Database Administrator or Organizations: No    Attends Banker Meetings: Never    Marital Status: Widowed  Intimate Partner Violence: Not At Risk (10/23/2024)   Epic    Fear of Current or Ex-Partner: No    Emotionally Abused: No    Physically Abused: No    Sexually Abused: No  Depression (PHQ2-9): Low Risk (04/21/2022)   Depression (PHQ2-9)    PHQ-2 Score: 0  Alcohol Screen: Low Risk (09/24/2024)   Alcohol Screen    Last Alcohol Screening Score (AUDIT): 0  Housing: Low Risk (10/23/2024)   Epic    Unable to  Pay for Housing in the Last Year: No    Number of Times Moved in the Last Year: 0    Homeless in the Last Year: No  Utilities: Not At Risk (10/23/2024)   Epic    Threatened with loss of utilities: No  Health Literacy: Not on file    Family History:    Family History  Problem Relation Age of Onset   Diabetes Mother    Hyperlipidemia Mother    Hypertension Mother     Cancer Father    Hypertension Father    Diabetes Sister    Hypertension Sister    Cancer Brother    Heart attack Neg Hx      ROS:  Please see the history of present illness.   All other ROS reviewed and negative.     Physical Exam/Data: Vitals:   11/06/24 2356 11/07/24 0431 11/07/24 0752 11/07/24 0800  BP: (!) 93/58 95/75 (!) 76/64 102/81  Pulse: 96 87 82   Resp: 16 18 19    Temp: 98 F (36.7 C) 97.9 F (36.6 C) 98.1 F (36.7 C)   TempSrc: Oral Oral Oral   SpO2: 100% 96% 96% 90%  Weight:      Height:        Intake/Output Summary (Last 24 hours) at 11/07/2024 1801 Last data filed at 11/06/2024 1900 Gross per 24 hour  Intake 120 ml  Output 225 ml  Net -105 ml      11/06/2024    6:23 PM 11/06/2024   10:34 AM 10/23/2024   10:21 AM  Last 3 Weights  Weight (lbs) 150 lb 2.1 oz 138 lb 152 lb 1.9 oz  Weight (kg) 68.1 kg 62.596 kg 69 kg     Body mass index is 22.17 kg/m.  General: Chronically ill-appearing gentleman laying in bed in NAD HEENT: normal Neck: JVD elevated to midneck Vascular: No carotid bruits; Distal pulses 2+ bilaterally Cardiac:  normal S1, S2; RRR; II/VI systolic murmur at LLSB, no rubs or gallops Lungs: Bilateral rales, no wheezing or rhonchi Abd: soft, nontender, no hepatomegaly  Ext: R BKA, lower extremity edema left Musculoskeletal: R BKA, BUE and BLE strength normal and equal Skin: warm and dry  Neuro:  CNs 2-12 intact, no focal abnormalities noted Psych:  Normal affect   EKG:  The EKG was personally reviewed and demonstrates: NSR Telemetry:  Telemetry was personally reviewed and demonstrates: NSR  Relevant CV Studies:   TTE 11/07/24:  IMPRESSIONS     1. Left ventricular ejection fraction, by estimation, is <20%. The left  ventricle has severely decreased function. The left ventricle demonstrates  regional wall motion abnormalities (see scoring diagram/findings for  description). There is mild left  ventricular hypertrophy. Left  ventricular diastolic parameters are  indeterminate.   2. Echo enhancing images with defect in the RV consistent with possible  RV thrombus. . Right ventricular systolic function is severely reduced.  The right ventricular size is moderately enlarged.   3. The mitral valve is degenerative. Moderate to severe mitral valve  regurgitation. Mild mitral stenosis.   4. The aortic valve is calcified. Aortic valve regurgitation is trivial.  Moderate aortic valve stenosis.   Laboratory Data: High Sensitivity Troponin:  No results for input(s): TROPONINIHS in the last 720 hours.  Recent Labs  Lab 11/06/24 1137 11/06/24 1319  TRNPT 141* 133*      Chemistry Recent Labs  Lab 11/06/24 1137 11/06/24 1145 11/06/24 2105 11/07/24 0246  NA 137 136 137 137  K  3.3* 3.7 3.1* 3.0*  CL 97*  --  97* 100  CO2 23  --  25 21*  GLUCOSE 464*  --  438* 361*  BUN 39*  --  43* 42*  CREATININE 1.97*  --  1.91* 1.89*  CALCIUM  9.9  --  9.3 9.5  MG  --   --  1.6* 1.8  GFRNONAA 33*  --  34* 35*  ANIONGAP 17*  --  15 17*    Recent Labs  Lab 11/06/24 1137 11/07/24 0246  PROT 6.4* 6.3*  ALBUMIN  3.5 3.4*  AST 21 16  ALT 19 17  ALKPHOS 117 110  BILITOT 1.3* 1.1   Lipids No results for input(s): CHOL, TRIG, HDL, LABVLDL, LDLCALC, CHOLHDL in the last 168 hours.  Hematology Recent Labs  Lab 11/06/24 1137 11/06/24 1145 11/06/24 2105 11/07/24 0246  WBC 5.3  --   --  6.3  RBC 3.29*  --  3.44* 3.52*  HGB 9.7* 10.9*  --  10.5*  HCT 30.7* 32.0*  --  34.3*  MCV 93.3  --   --  97.4  MCH 29.5  --   --  29.8  MCHC 31.6  --   --  30.6  RDW 17.7*  --   --  17.6*  PLT 91*  --   --  99*   Thyroid  No results for input(s): TSH, FREET4 in the last 168 hours.  BNP Recent Labs  Lab 11/06/24 1137  PROBNP >35,000.0*    DDimer  Recent Labs  Lab 11/06/24 1137  DDIMER 1.49*    Radiology/Studies:  ECHOCARDIOGRAM COMPLETE Result Date: 11/07/2024    ECHOCARDIOGRAM REPORT   Patient Name:    Keith Espinoza Date of Exam: 11/07/2024 Medical Rec #:  [id number]              Height:       69.0 in Accession #:    [id number]             Weight:       150.1 lb Date of Birth:  08-03-1941              BSA:          1.829 m Patient Age:    83 years               BP:           102/81 mmHg Patient Gender: M                      HR:           107 bpm. Exam Location:  Inpatient Procedure: 2D Echo, Cardiac Doppler, Color Doppler and Intracardiac            Opacification Agent (Both Spectral and Color Flow Doppler were            utilized during procedure). Indications:    R07.9* Chest pain, unspecified. Elevated troponin  History:        Patient has prior history of Echocardiogram examinations. CHF,                 CAD, Abnormal ECG, Arrythmias:Atrial Fibrillation; Risk                 Factors:Hypertension, Dyslipidemia, Current Smoker and Diabetes.  Sonographer:    Ellouise Mose RDCS Referring Phys: [id number] ANASTASSIA DOUTOVA IMPRESSIONS  1. Left ventricular ejection fraction, by estimation, is <20%. The left ventricle has severely decreased  function. The left ventricle demonstrates regional wall motion abnormalities (see scoring diagram/findings for description). There is mild left ventricular hypertrophy. Left ventricular diastolic parameters are indeterminate.  2. Echo enhancing images with defect in the RV consistent with possible RV thrombus. . Right ventricular systolic function is severely reduced. The right ventricular size is moderately enlarged.  3. The mitral valve is degenerative. Moderate to severe mitral valve regurgitation. Mild mitral stenosis.  4. The aortic valve is calcified. Aortic valve regurgitation is trivial. Moderate aortic valve stenosis. FINDINGS  Left Ventricle: Left ventricular ejection fraction, by estimation, is <20%. The left ventricle has severely decreased function. The left ventricle demonstrates regional wall motion abnormalities. Definity  contrast agent was given IV to delineate the  left ventricular endocardial borders. There is mild left ventricular hypertrophy. Left ventricular diastolic parameters are indeterminate. Right Ventricle: Echo enhancing images with defect in the RV consistent with possible RV thrombus. The right ventricular size is moderately enlarged. No increase in right ventricular wall thickness. Right ventricular systolic function is severely reduced. Left Atrium: Left atrial size was normal in size. Right Atrium: Right atrial size was normal in size. Pericardium: There is no evidence of pericardial effusion. Mitral Valve: The mitral valve is degenerative in appearance. Moderate to severe mitral valve regurgitation. Mild mitral valve stenosis. MV peak gradient, 5.8 mmHg. The mean mitral valve gradient is 2.0 mmHg. Tricuspid Valve: The tricuspid valve is grossly normal. Tricuspid valve regurgitation is mild. Aortic Valve: The aortic valve is calcified. Aortic valve regurgitation is trivial. Moderate aortic stenosis is present. Aortic valve mean gradient measures 7.5 mmHg. Aortic valve peak gradient measures 11.2 mmHg. Aortic valve area, by VTI measures 1.35 cm. Pulmonic Valve: The pulmonic valve was normal in structure. Pulmonic valve regurgitation is trivial. No evidence of pulmonic stenosis. Aorta: The aortic root and ascending aorta are structurally normal, with no evidence of dilitation. IAS/Shunts: No atrial level shunt detected by color flow Doppler.  LEFT VENTRICLE PLAX 2D LVIDd:         4.90 cm LVIDs:         4.70 cm LV PW:         1.30 cm LV IVS:        1.30 cm LVOT diam:     2.13 cm LV SV:         33 LV SV Index:   18 LVOT Area:     3.56 cm  LV Volumes (MOD) LV vol d, MOD A2C: 139.0 ml LV vol d, MOD A4C: 157.0 ml LV vol s, MOD A2C: 120.0 ml LV vol s, MOD A4C: 121.0 ml LV SV MOD A2C:     19.0 ml LV SV MOD A4C:     157.0 ml LV SV MOD BP:      26.7 ml RIGHT VENTRICLE            IVC RV S prime:     5.55 cm/s  IVC diam: 2.58 cm TAPSE (M-mode): 0.6 cm LEFT ATRIUM              Index        RIGHT ATRIUM           Index LA diam:        3.72 cm 2.03 cm/m   RA Area:     14.00 cm LA Vol (A2C):   50.7 ml 27.72 ml/m  RA Volume:   30.80 ml  16.84 ml/m LA Vol (A4C):   64.5 ml 35.27 ml/m LA Biplane Vol: 57.1  ml 31.22 ml/m  AORTIC VALVE                     PULMONIC VALVE AV Area (Vmax):    1.35 cm      PR End Diast Vel: 0.96 msec AV Area (Vmean):   1.27 cm AV Area (VTI):     1.35 cm AV Vmax:           167.20 cm/s AV Vmean:          108.520 cm/s AV VTI:            0.246 m AV Peak Grad:      11.2 mmHg AV Mean Grad:      7.5 mmHg LVOT Vmax:         63.40 cm/s LVOT Vmean:        38.800 cm/s LVOT VTI:          0.094 m LVOT/AV VTI ratio: 0.38  AORTA Ao Root diam: 2.98 cm Ao Asc diam:  3.86 cm MITRAL VALVE                  TRICUSPID VALVE MV Area (PHT): 6.93 cm       TR Peak grad:   26.8 mmHg MV Area VTI:   0.87 cm       TR Vmax:        259.00 cm/s MV Peak grad:  5.8 mmHg MV Mean grad:  2.0 mmHg       SHUNTS MV Vmax:       1.20 m/s       Systemic VTI:  0.09 m MV Vmean:      72.1 cm/s      Systemic Diam: 2.13 cm MV Decel Time: 110 msec MR Peak grad:    74.0 mmHg MR Mean grad:    46.0 mmHg MR Vmax:         430.00 cm/s MR Vmean:        314.0 cm/s MR PISA:         2.32 cm MR PISA Eff ROA: 20 mm MR PISA Radius:  0.61 cm MV E velocity: 144.50 cm/s Kardie Tobb DO Electronically signed by Dub Huntsman DO Signature Date/Time: 11/07/2024/1:16:40 PM    Final    IR THORACENTESIS RIGHT ASP PLEURAL SPACE W/IMG GUIDE Result Date: 11/07/2024 INDICATION: 84 year old male who is COVID+ presents with right pleural effusion. Received request for diagnostic and therapeutic thoracentesis. This is patient's first thoracentesis. EXAM: ULTRASOUND GUIDED DIAGNOSTIC AND THERAPEUTIC, RIGHT-SIDED THORACENTESIS MEDICATIONS: 8 mL 1% lidocaine  with epinephrine  COMPLICATIONS: None immediate. PROCEDURE: An ultrasound guided thoracentesis was thoroughly discussed with the patient and questions answered. The benefits, risks,  alternatives and complications were also discussed. The patient understands and wishes to proceed with the procedure. Written consent was obtained. Ultrasound was performed to localize and mark an adequate pocket of fluid in the right chest. The area was then prepped and draped in the normal sterile fashion. 1% lidocaine  with epinephrine  was used for local anesthesia. Under ultrasound guidance a 6 Fr Safe-T-Centesis catheter was introduced. Thoracentesis was performed. The catheter was removed and a dressing applied. FINDINGS: A total of approximately 1 liter of clear yellow fluid was removed. Samples were sent to the laboratory as requested by the clinical team. IMPRESSION: Successful ultrasound guided right thoracentesis yielding 1 liter of pleural fluid. Performed by: Kristi Davenport, NP under the supervision of Dr. Juliene Balder Electronically Signed   By: Juliene Balder HERO.D.  On: 11/07/2024 10:41   DG Chest Port 1 View Result Date: 11/07/2024 EXAM: 1 VIEW XRAY OF THE CHEST 11/07/2024 09:22:00 AM COMPARISON: 11/06/2024 CLINICAL HISTORY: 84 year old male. Status post thoracentesis. 1 liter of right side pleural fluid removed this morning. FINDINGS: LUNGS AND PLEURA: Improved aeration of right lung base with persistent moderate basilar opacities. No pneumothorax. Incidental skin fold artifact in the mid left chest. HEART AND MEDIASTINUM: Aortic atherosclerosis. No acute abnormality of the cardiac and mediastinal silhouettes. BONES AND SOFT TISSUES: Surgical anchors in right humeral head. No acute osseous abnormality. IMPRESSION: 1. Improved right lung ventilation and no pneumothorax following thoracentesis. Electronically signed by: Helayne Hurst MD 11/07/2024 09:34 AM EST RP Workstation: [id number]   CT Chest Wo Contrast Result Date: 11/06/2024 EXAM: CT CHEST WITHOUT CONTRAST 11/06/2024 11:54:23 AM TECHNIQUE: CT of the chest was performed without the administration of intravenous contrast. Multiplanar reformatted  images are provided for review. Automated exposure control, iterative reconstruction, and/or weight based adjustment of the mA/kV was utilized to reduce the radiation dose to as low as reasonably achievable. COMPARISON: None available. CLINICAL HISTORY: Pulm edema, PNA? Pulmonary edema; query pneumonia. FINDINGS: MEDIASTINUM: Heart and pericardium are unremarkable. Coronary artery calcifications noted. No pericardial effusion. The central airways are clear. LYMPH NODES: No mediastinal, hilar or axillary lymphadenopathy. LUNGS AND PLEURA: Moderate large layering right pleural effusion. There is atelectasis and ground glass density in the right lower lobe suggesting potential aspiration or pneumonia superimposed on the atelectasis. No pneumothorax. SOFT TISSUES/BONES: No acute abnormality of the bones or soft tissues. No fracture. UPPER ABDOMEN: Limited images of the upper abdomen demonstrate mild ascites. 2 cm benign adenoma of the left adrenal gland. IMPRESSION: 1. Moderate to large layering right pleural effusion. 2. Right lower lobe atelectasis with ground-glass opacity, which may represent aspiration or pneumonia superimposed on atelectasis. Electronically signed by: Norleen Boxer MD 11/06/2024 12:08 PM EST RP Workstation: [id number]   CT ABDOMEN PELVIS WO CONTRAST Result Date: 11/06/2024 EXAM: CT ABDOMEN AND PELVIS WITHOUT CONTRAST 11/06/2024 11:54:23 AM TECHNIQUE: CT of the abdomen and pelvis was performed without the administration of intravenous contrast. Multiplanar reformatted images are provided for review. Automated exposure control, iterative reconstruction, and/or weight-based adjustment of the mA/kV was utilized to reduce the radiation dose to as low as reasonably achievable. COMPARISON: None available. CLINICAL HISTORY: Abdominal pain, acute, nonlocalized. Acute, nonlocalized abdominal pain. FINDINGS: LOWER CHEST: Moderate to large right pleural effusion with right lobe atelectasis versus infiltrate.  Might exclude right lower lobe infection. LIVER: The liver is unremarkable. GALLBLADDER AND BILE DUCTS: Cholecystectomy. No biliary ductal dilatation. SPLEEN: No acute abnormality. PANCREAS: No acute abnormality. ADRENAL GLANDS: No acute abnormality. KIDNEYS, URETERS AND BLADDER: No stones in the kidneys or ureters. No hydronephrosis. No perinephric or periureteral stranding. Ureters and bladder are normal. GI AND BOWEL: Stomach demonstrates no acute abnormality. There is no bowel obstruction. PERITONEUM AND RETROPERITONEUM: Moderate volume of free fluid in the abdomen and pelvis. No free air. VASCULATURE: Vascular calcifications of the abdominal aorta. LYMPH NODES: No lymphadenopathy. REPRODUCTIVE ORGANS: No acute abnormality. BONES AND SOFT TISSUES: Bilateral nonobstructing renal calculi. Right hip prosthetic. No acute osseous abnormality. IMPRESSION: 1. Moderate to large right pleural effusion with right lobe atelectasis versus infiltrate. 2. Moderate volume of free fluid in the abdomen and pelvis. 3. No evidence of bowel obstruction. Electronically signed by: Norleen Boxer MD 11/06/2024 12:04 PM EST RP Workstation: [id number]   DG Chest Port 1 View Result Date: 11/06/2024 CLINICAL DATA:  Questionable sepsis. EXAM: PORTABLE CHEST 1  VIEW COMPARISON:  October 05, 2024 FINDINGS: The cardiac silhouette is mildly enlarged and unchanged in size. A coronary artery stent is in place. There is marked severity calcification of the aortic arch. Low lung volumes are noted. Diffusely increased interstitial lung markings are seen with marked severity atelectasis and/or infiltrate noted within the right lung base. A moderate size right-sided pleural effusion is also present. No pneumothorax is identified. Multilevel degenerative changes seen throughout the thoracic spine. IMPRESSION: 1. Stable cardiomegaly with mild interstitial edema. 2. Marked severity right basilar atelectasis and/or infiltrate. 3. Moderate sized right-sided  pleural effusion. Electronically Signed   By: Suzen Dials M.D.   On: 11/06/2024 11:26     Assessment and Plan:  Keith Espinoza is a 84 y.o. 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 Lue  #Cardiogenic Shock #Acute HFrEF (EF <20%) #Severe RV Systolic Dysfunction - Patient admitted with GI upset and food intolerance which is likely a manifestation of low cardiac output found to have severe biventricular systolic dysfunction. - Lactate is elevated, he is fluid overloaded, he has poor renal perfusion, and is lethargic -all signs consistent with cardiogenic shock. - I had an extensive conversation with the patient and his daughter about the patient's prognosis which is very guarded.  He most likely has a severe ischemic cardiomyopathy without the ability to be revascularized and he is not a great candidate for advanced heart failure therapies. - I discussed this case with Dr. Daniel Bensimhon given that the patient is in cardiogenic shock. - The plan is to place a PICC line and start low-dose dobutamine  to hopefully enhance organ perfusion with a low threshold to move to the CICU if he does not tolerate the infusion. - This intervention is certainly palliative and palliative care should be involved tomorrow. Place PICC line Start dobutamine  2.5 mcg Recheck BMP for potassium Give Lasix  120 mg IV once potassium is replete Advanced heart failure to see patient tomorrow Will need palliative care consultation Low threshold to move to CICU if he does not tolerate dobutamine  Holding all potential GDMT given cardiogenic shock  #Possible RV Thrombus - TTE reported possible RV thrombus. - I reviewed the patient's echocardiogram and it is challenging to know if the echodensity seen in the  RV is the moderator band versus a thrombus. - Unfortunately, the patient has poor renal function and thus cannot have a contrasted CT scan to better evaluate the potential thrombus. - Given this limitation, will empirically start heparin  for treatment. Renal impairment precludes CT imaging Start heparin  per pharmacy Could consider cardiac MRI to better evaluate if within GOC  #CAD #HTN #HLD Hold home antihypertensives Continue Plavix  75 mg daily    Risk Assessment/Risk Scores:       [place]  [employer] (NYHA) Functional Class NYHA Class IV       For questions or updates, please contact [facility] Please consult [email or web address] for contact info under      Signed, Georganna Archer, MD  11/07/2024 6:01 PM      [1]  Allergies Allergen Reactions   Zetia [Ezetimibe] Other (See Comments)    Myalgias    Zocor [Simvastatin] Other (See Comments)    Myalgias     Dilaudid  [Hydromorphone  Hcl] Other (See Comments)    Hallucinations  Entresto  [Sacubitril -Valsartan ] Other (See Comments)    Discontinued by MD due to persistent hypotension    Imdur  [Isosorbide  Nitrate] Other (See Comments)    Discontinued by MD due to persistent hypotension with isosorbide  ER 60mg .   Toprol  Xl [Metoprolol ] Other (See Comments)    Discontinued by MD due to persistent hypotension at 12.5mg     "

## 2024-11-07 NOTE — Consult Note (Signed)
 "   Advanced Heart Failure Team Consult Note   Primary Physician: Charlott Dorn LABOR, MD Cardiologist:  Gordy Bergamo, MD HPI:    Felipe Paluch is seen today for evaluation of cardiogenic shock at the request of Dr. Floretta.   Murrell Dome is a 84 y.o. male with DM2, severe inoperable 3v CAD by cath 7/24. PAD s/p R BKA  Echo 8/25 EF 40-45% triv MR moderate AS, CKD IV baseline SCr 1.5-2.0  Recently has been very debilitated. WC bound. Has lost 70 pounds in last year. Lives home alone with his cat. Came in with weakness and n/v. Seen by Dr. Floretta Found to be in low output HF  Echo EF < 20% RV severely hk severe MR mod-severe AS. ? RV clot  LA 4.9 -> 4.7 -> 2.3 SCr 1.9   DBA ordered earlier this evening but not started yet. PICC placed  Home Medications Prior to Admission medications  Medication Sig Start Date End Date Taking? Authorizing Provider  allopurinol  (ZYLOPRIM ) 100 MG tablet Take 0.5 tablets (50 mg total) by mouth every other day. 09/25/24 11/07/24 Yes Sigdel, Santosh, MD  Ascorbic Acid (VITAMIN C PO) Take 1 tablet by mouth at bedtime.   Yes [provider]  Cholecalciferol  (VITAMIN D -3 PO) Take 1 capsule by mouth at bedtime.   Yes [provider]  clopidogrel  (PLAVIX ) 75 MG tablet Take 1 tablet (75 mg total) by mouth daily. 09/21/24  Yes Conte, Tessa N, PA-C  Cyanocobalamin  (VITAMIN B-12 PO) Take 1 tablet by mouth daily.   Yes [provider]  dicyclomine  (BENTYL ) 10 MG capsule Take 1 capsule (10 mg total) by mouth 3 (three) times daily as needed for spasms. 09/19/24 11/07/24 Yes Regalado, Belkys A, MD  furosemide  (LASIX ) 20 MG tablet Take 1 tablet (20 mg total) by mouth daily. 10/12/24  Yes Clegg, Amy D, NP  gabapentin  (NEURONTIN ) 300 MG capsule Take 1 capsule (300 mg total) by mouth at bedtime. Patient taking differently: Take 300 mg by mouth daily. 09/19/24 11/07/24 Yes Regalado, Belkys A, MD  insulin  aspart (NOVOLOG  FLEXPEN) 100  UNIT/ML FlexPen Max daily 30 units Patient taking differently: Inject into the skin in the morning and at bedtime. 06/08/24  Yes [name], [name], MD  insulin  degludec (TRESIBA  FLEXTOUCH) 100 UNIT/ML FlexTouch Pen Inject 5 Units into the skin daily. Patient taking differently: Inject into the skin at bedtime as needed (high bs). 09/19/24  Yes Regalado, Belkys A, MD  MAGNESIUM  PO Take 1 tablet by mouth at bedtime.   Yes [provider]  meclizine  (ANTIVERT ) 12.5 MG tablet Take 1 tablet (12.5 mg total) by mouth daily as needed for dizziness. Patient taking differently: Take 12.5 mg by mouth daily. 10/06/24  Yes Laurence Locus, DO  metFORMIN  (GLUCOPHAGE ) 1000 MG tablet Take 1,000 mg by mouth daily.   Yes [provider]  nitroGLYCERIN  (NITROSTAT ) 0.4 MG SL tablet Place 1 tablet (0.4 mg total) under the tongue every 5 (five) minutes x 3 doses as needed for chest pain. 04/16/24  Yes Bergamo Gordy, MD  pantoprazole  (PROTONIX ) 40 MG tablet Take 1 tablet (40 mg total) by mouth 2 (two) times daily. 09/19/24  Yes Regalado, Belkys A, MD  rosuvastatin  (CRESTOR ) 10 MG tablet Take 1 tablet (10 mg total) by mouth daily. 10/27/24 11/26/24 Yes Pahwani, Fredia, MD  sucralfate  (CARAFATE ) 1 GM/10ML suspension Take 10 mLs (1 g total) by mouth 2 (two) times daily. 09/19/24  Yes Regalado, Belkys A, MD  terazosin  (HYTRIN ) 5  MG capsule Take 5 mg by mouth 2 (two) times daily.   Yes [provider]  TURMERIC CURCUMIN PO Take 1 tablet by mouth at bedtime.   Yes [provider]  Continuous Glucose Sensor (DEXCOM G7 SENSOR) MISC 1 Device by Does not apply route as directed. 09/10/24   [name], [name], MD  Insulin  Pen Needle 32G X 4 MM MISC 1 Device by Does not apply route in the morning, at noon, in the evening, and at bedtime. 06/08/24   [name], [name], MD    Past Medical History: Past Medical History:  Diagnosis Date   Allergic rhinitis    Anemia    Aortic stenosis     Arthritis    Basal cell carcinoma 11/01/2019    bcc left chest treatment TX [id number]    Below-knee amputation of right lower extremity (HCC) 12/12/2020   CHF (congestive heart failure) (HCC)    Chronic leg pain    right   Chronic lower back pain    Coronary artery disease    a. Stenting to RCA 2004; staged DES to LAD and Cx 2004. DES to [facility] 2012. b. DES to mCx, PTCA to dCx 11/2011. c. Lateral wall MI 2013 s/p PTCA to distal Cx & DES to mid OM2 11/2011. d. Low risk nuc 04/2014, EF wnl.   COVID-19    Dehiscence of amputation stump (HCC)    Diabetes mellitus    Insulin  dependent   Diabetic neuropathy (HCC)    MILD   Diverticulosis    Dysrhythmia    Edema of both lower extremities 07/05/2023   Fecal impaction (HCC) 02/01/2021   Bertrum syndrome    Gout    right wrist; right foot; right elbow; have had it since 1970's   H/O hiatal hernia    Heart murmur    Hip dislocation, right (HCC) 04/13/2019   History of echocardiogram    aortic sclerosis per echo 12/09 EF 65%, otherwise normal   History of hemorrhoids    BLEEDING   History of kidney stones    h/o   Hypertension    Diagnosed 1995    Myocardial infarction [facility])    Pancreatic pseudocyst    a. s/p remote drainage 2006.   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   Status post percutaneous transluminal coronary angioplasty 01/06/2021   Thrombocytopenia    Seen on oldest labs in system from 2004   Vertigo    Vitamin B 12 deficiency    orally replaced    Past Surgical History: Past Surgical History:  Procedure Laterality Date   ABDOMINAL AORTOGRAM W/LOWER EXTREMITY Bilateral 08/08/2020   Procedure: ABDOMINAL AORTOGRAM W/LOWER EXTREMITY;  Surgeon: Eliza Lonni RAMAN, MD;  Location: [facility] INVASIVE CV LAB;  Service: Cardiovascular;  Laterality: Bilateral;   AMPUTATION Right  12/12/2020   Procedure: RIGHT BELOW KNEE AMPUTATION;  Surgeon: Harden Jerona GAILS, MD;  Location: [facility] OR;  Service: Orthopedics;  Laterality: Right;   BACK SURGERY     total of 3 times S/P fall    CARPAL TUNNEL RELEASE Bilateral    CHOLECYSTECTOMY  1990's   CIRCUMCISION N/A 10/12/2023   Procedure: Dorsal slit;  Surgeon: [name], MD;  Location: [facility] ORS;  Service: Urology;  Laterality: N/A;   COLONOSCOPY     COLONOSCOPY Left 07/08/2023   Procedure: COLONOSCOPY;  Surgeon: San,  Sandor GAILS, DO;  Location: [facility] ENDOSCOPY;  Service: Gastroenterology;  Laterality: Left;   CORONARY ANGIOPLASTY  11/11/11   CORONARY ANGIOPLASTY WITH STENT PLACEMENT  09/30/2011   1 then; makes a total of 4   CORONARY ANGIOPLASTY WITH STENT PLACEMENT  11/11/11   1; makes a total of 5   ESOPHAGOGASTRODUODENOSCOPY Left 07/08/2023   Procedure: ESOPHAGOGASTRODUODENOSCOPY (EGD);  Surgeon: San Sandor GAILS, DO;  Location: [facility] ENDOSCOPY;  Service: Gastroenterology;  Laterality: Left;   HEMOSTASIS CLIP PLACEMENT  07/08/2023   Procedure: HEMOSTASIS CLIP PLACEMENT;  Surgeon: San Sandor GAILS, DO;  Location: [facility] ENDOSCOPY;  Service: Gastroenterology;;   HOT HEMOSTASIS N/A 07/08/2023   Procedure: HOT HEMOSTASIS (ARGON PLASMA COAGULATION/BICAP);  Surgeon: San Sandor GAILS, DO;  Location: [facility] ENDOSCOPY;  Service: Gastroenterology;  Laterality: N/A;   INGUINAL HERNIA REPAIR  2003   right   IR THORACENTESIS RIGHT ASP PLEURAL SPACE W/IMG GUIDE  11/07/2024   JOINT REPLACEMENT Right 04/03/2002   hip replacment   KNEE ARTHROSCOPY  1990's   left   LEFT HEART CATH AND CORONARY ANGIOGRAPHY N/A 04/25/2023   Procedure: LEFT HEART CATH AND CORONARY ANGIOGRAPHY;  Surgeon: Anner Alm ORN, MD;  Location: [facility] INVASIVE CV LAB;  Service: Cardiovascular;  Laterality: N/A;   LEFT HEART CATHETERIZATION WITH CORONARY ANGIOGRAM N/A 09/30/2011   Procedure: LEFT HEART CATHETERIZATION WITH CORONARY ANGIOGRAM;  Surgeon: Candyce GORMAN Reek, MD;  Location:  [facility] CATH LAB;  Service: Cardiovascular;  Laterality: N/A;  possible PCI   LEFT HEART CATHETERIZATION WITH CORONARY ANGIOGRAM N/A 11/15/2011   Procedure: LEFT HEART CATHETERIZATION WITH CORONARY ANGIOGRAM;  Surgeon: Candyce GORMAN Reek, MD;  Location: [facility] CATH LAB;  Service: Cardiovascular;  Laterality: N/A;   PERCUTANEOUS CORONARY STENT INTERVENTION (PCI-S)  09/30/2011   Procedure: PERCUTANEOUS CORONARY STENT INTERVENTION (PCI-S);  Surgeon: Candyce GORMAN Reek, MD;  Location: [facility] CATH LAB;  Service: Cardiovascular;;   PERCUTANEOUS CORONARY STENT INTERVENTION (PCI-S) N/A 11/11/2011   Procedure: PERCUTANEOUS CORONARY STENT INTERVENTION (PCI-S);  Surgeon: Candyce GORMAN Reek, MD;  Location: [facility] CATH LAB;  Service: Cardiovascular;  Laterality: N/A;   PERIPHERAL VASCULAR BALLOON ANGIOPLASTY Right 08/08/2020   Procedure: PERIPHERAL VASCULAR BALLOON ANGIOPLASTY;  Surgeon: Eliza Lonni GORMAN, MD;  Location: [facility] INVASIVE CV LAB;  Service: Cardiovascular;  Laterality: Right;  Posterior tibial    SHOULDER SURGERY Right    X 2   STUMP REVISION Right 01/09/2021   Procedure: REVISION RIGHT BELOW KNEE AMPUTATION;  Surgeon: Harden Jerona GAILS, MD;  Location: [facility] OR;  Service: Orthopedics;  Laterality: Right;   TONSILLECTOMY  ~ 1948   TOTAL HIP REVISION Right 04/13/2019   Procedure: RIGHT TOTAL HIP REVISION-POSTERIOR  APPROACH LATERAL;  Surgeon: Barbarann Oneil BROCKS, MD;  Location: [facility] OR;  Service: Orthopedics;  Laterality: Right;   TOTAL KNEE ARTHROPLASTY Left 07/23/2016   Procedure: LEFT TOTAL KNEE ARTHROPLASTY;  Surgeon: Oneil BROCKS Barbarann, MD;  Location: [facility] OR;  Service: Orthopedics;  Laterality: Left;    Family History: Family History  Problem Relation Age of Onset   Diabetes Mother    Hyperlipidemia Mother    Hypertension Mother    Cancer Father    Hypertension Father    Diabetes Sister    Hypertension Sister    Cancer Brother    Heart attack Neg Hx     Social History: Social History   Socioeconomic History    Marital status: Widowed    Spouse name: Not on file   Number of children: 1   Years of education: Not on file  Highest education level: High school graduate  Occupational History   Occupation: Retired  Tobacco Use   Smoking status: Former    Types: Cigarettes   Smokeless tobacco: Current    Types: Chew   Tobacco comments:    quit 60 years ago  Vaping Use   Vaping status: Never Used  Substance and Sexual Activity   Alcohol use: No   Drug use: No   Sexual activity: Not Currently  Other Topics Concern   Not on file  Social History Narrative   Not on file   Social Drivers of Health   Tobacco Use: High Risk (11/06/2024)   Patient History    Smoking Tobacco Use: Former    Smokeless Tobacco Use: Current    Passive Exposure: Not on Actuary Strain: Low Risk (09/24/2024)   Overall Financial Resource Strain (CARDIA)    Difficulty of Paying Living Expenses: Not hard at all  Food Insecurity: No Food Insecurity (10/23/2024)   Epic    Worried About Radiation Protection Practitioner of Food in the Last Year: Never true    Ran Out of Food in the Last Year: Never true  Transportation Needs: No Transportation Needs (10/23/2024)   Epic    Lack of Transportation (Medical): No    Lack of Transportation (Non-Medical): No  Physical Activity: Not on file  Stress: No Stress Concern Present (06/12/2023)   Received from [facility] - Occupational Stress Questionnaire    Feeling of Stress : Not at all  Social Connections: Socially Isolated (10/23/2024)   Social Connection and Isolation Panel    Frequency of Communication with Friends and Family: Three times a week    Frequency of Social Gatherings with Friends and Family: Twice a week    Attends Religious Services: Never    Database Administrator or Organizations: No    Attends Banker Meetings: Never    Marital Status: Widowed  Depression (PHQ2-9): Low Risk (04/21/2022)   Depression (PHQ2-9)     PHQ-2 Score: 0  Alcohol Screen: Low Risk (09/24/2024)   Alcohol Screen    Last Alcohol Screening Score (AUDIT): 0  Housing: Low Risk (10/23/2024)   Epic    Unable to Pay for Housing in the Last Year: No    Number of Times Moved in the Last Year: 0    Homeless in the Last Year: No  Utilities: Not At Risk (10/23/2024)   Epic    Threatened with loss of utilities: No  Health Literacy: Not on file    Allergies:  Allergies[1]  Objective:   Vital Signs:   Temp:  [97.1 F (36.2 C)-98.1 F (36.7 C)] 97.1 F (36.2 C) (02/04 1949) Pulse Rate:  [82-96] 89 (02/04 1949) Resp:  [13-25] 18 (02/04 1949) BP: (76-105)/(58-81) 105/72 (02/04 1949) SpO2:  [90 %-100 %] 90 % (02/04 0800) Last BM Date : 11/06/24  Weight change: Filed Weights   11/06/24 1034 11/06/24 1823  Weight: 62.6 kg 68.1 kg    Intake/Output:  No intake or output data in the 24 hours ending 11/07/24 2219  Physical Exam   General:  Chronically ill appearing. No resp difficulty HEENT: normal Neck: supple.JVP to ear  Cor: Irregular rate & rhythm.3/6 MR Lungs: clear Abdomen: soft, nontender, nondistended.Good bowel sounds. Extremities: no cyanosis, clubbing, rash, 2+ edema on left s/p R BKA  Neuro: alert & orientedx3, cranial nerves grossly intact. moves all 4 extremities w/o difficulty. Affect pleasant   Telemetry  AF 80-90s Personally reviewed  EKG    AF 104 IVCD Personally reviewed (ECG from 10/24/24 Sinus rhythm)  Labs   Basic Metabolic Panel: Recent Labs  Lab 11/06/24 1137 11/06/24 1145 11/06/24 2105 11/07/24 0246  NA 137 136 137 137  K 3.3* 3.7 3.1* 3.0*  CL 97*  --  97* 100  CO2 23  --  25 21*  GLUCOSE 464*  --  438* 361*  BUN 39*  --  43* 42*  CREATININE 1.97*  --  1.91* 1.89*  CALCIUM  9.9  --  9.3 9.5  MG  --   --  1.6* 1.8  PHOS  --   --  2.8 2.5    Liver Function Tests: Recent Labs  Lab 11/06/24 1137 11/07/24 0246  AST 21 16  ALT 19 17  ALKPHOS 117 110  BILITOT 1.3* 1.1   PROT 6.4* 6.3*  ALBUMIN  3.5 3.4*   Recent Labs  Lab 11/06/24 1137  LIPASE 23   Recent Labs  Lab 11/06/24 2105  AMMONIA <13    CBC: Recent Labs  Lab 11/06/24 1137 11/06/24 1145 11/07/24 0246  WBC 5.3  --  6.3  NEUTROABS 4.2  --   --   HGB 9.7* 10.9* 10.5*  HCT 30.7* 32.0* 34.3*  MCV 93.3  --  97.4  PLT 91*  --  99*    Cardiac Enzymes: No results for input(s): CKTOTAL, CKMB, CKMBINDEX, TROPONINI in the last 168 hours.  BNP: BNP (last 3 results) No results for input(s): BNP in the last 8760 hours.  ProBNP (last 3 results) Recent Labs    10/05/24 1620 10/25/24 1012 11/06/24 1137  PROBNP 27,900.0* 24,629.0* >35,000.0*     CBG: Recent Labs  Lab 11/07/24 0951 11/07/24 1158 11/07/24 1655 11/07/24 2005 11/07/24 2212  GLUCAP 132* 101* 108* 174* 177*    Coagulation Studies: Recent Labs    11/06/24 1137  LABPROT 20.1*  INR 1.6*     Imaging   US  EKG SITE RITE Result Date: 11/07/2024 If Site Rite image not attached, placement could not be confirmed due to current cardiac rhythm.  ECHOCARDIOGRAM COMPLETE Result Date: 11/07/2024    ECHOCARDIOGRAM REPORT   Patient Name:   FILIPE GREATHOUSE Date of Exam: 11/07/2024 Medical Rec #:  [id number]              Height:       69.0 in Accession #:    [id number]             Weight:       150.1 lb Date of Birth:  16-Sep-1941              BSA:          1.829 m Patient Age:    83 years               BP:           102/81 mmHg Patient Gender: M                      HR:           107 bpm. Exam Location:  Inpatient Procedure: 2D Echo, Cardiac Doppler, Color Doppler and Intracardiac            Opacification Agent (Both Spectral and Color Flow Doppler were            utilized during procedure). Indications:    R07.9* Chest pain, unspecified. Elevated troponin  History:  Patient has prior history of Echocardiogram examinations. CHF,                 CAD, Abnormal ECG, Arrythmias:Atrial Fibrillation; Risk                  Factors:Hypertension, Dyslipidemia, Current Smoker and Diabetes.  Sonographer:    Ellouise Mose RDCS Referring Phys: [id number] ANASTASSIA DOUTOVA IMPRESSIONS  1. Left ventricular ejection fraction, by estimation, is <20%. The left ventricle has severely decreased function. The left ventricle demonstrates regional wall motion abnormalities (see scoring diagram/findings for description). There is mild left ventricular hypertrophy. Left ventricular diastolic parameters are indeterminate.  2. Echo enhancing images with defect in the RV consistent with possible RV thrombus. . Right ventricular systolic function is severely reduced. The right ventricular size is moderately enlarged.  3. The mitral valve is degenerative. Moderate to severe mitral valve regurgitation. Mild mitral stenosis.  4. The aortic valve is calcified. Aortic valve regurgitation is trivial. Moderate aortic valve stenosis. FINDINGS  Left Ventricle: Left ventricular ejection fraction, by estimation, is <20%. The left ventricle has severely decreased function. The left ventricle demonstrates regional wall motion abnormalities. Definity  contrast agent was given IV to delineate the left ventricular endocardial borders. There is mild left ventricular hypertrophy. Left ventricular diastolic parameters are indeterminate. Right Ventricle: Echo enhancing images with defect in the RV consistent with possible RV thrombus. The right ventricular size is moderately enlarged. No increase in right ventricular wall thickness. Right ventricular systolic function is severely reduced. Left Atrium: Left atrial size was normal in size. Right Atrium: Right atrial size was normal in size. Pericardium: There is no evidence of pericardial effusion. Mitral Valve: The mitral valve is degenerative in appearance. Moderate to severe mitral valve regurgitation. Mild mitral valve stenosis. MV peak gradient, 5.8 mmHg. The mean mitral valve gradient is 2.0 mmHg. Tricuspid Valve: The tricuspid  valve is grossly normal. Tricuspid valve regurgitation is mild. Aortic Valve: The aortic valve is calcified. Aortic valve regurgitation is trivial. Moderate aortic stenosis is present. Aortic valve mean gradient measures 7.5 mmHg. Aortic valve peak gradient measures 11.2 mmHg. Aortic valve area, by VTI measures 1.35 cm. Pulmonic Valve: The pulmonic valve was normal in structure. Pulmonic valve regurgitation is trivial. No evidence of pulmonic stenosis. Aorta: The aortic root and ascending aorta are structurally normal, with no evidence of dilitation. IAS/Shunts: No atrial level shunt detected by color flow Doppler.  LEFT VENTRICLE PLAX 2D LVIDd:         4.90 cm LVIDs:         4.70 cm LV PW:         1.30 cm LV IVS:        1.30 cm LVOT diam:     2.13 cm LV SV:         33 LV SV Index:   18 LVOT Area:     3.56 cm  LV Volumes (MOD) LV vol d, MOD A2C: 139.0 ml LV vol d, MOD A4C: 157.0 ml LV vol s, MOD A2C: 120.0 ml LV vol s, MOD A4C: 121.0 ml LV SV MOD A2C:     19.0 ml LV SV MOD A4C:     157.0 ml LV SV MOD BP:      26.7 ml RIGHT VENTRICLE            IVC RV S prime:     5.55 cm/s  IVC diam: 2.58 cm TAPSE (M-mode): 0.6 cm LEFT ATRIUM  Index        RIGHT ATRIUM           Index LA diam:        3.72 cm 2.03 cm/m   RA Area:     14.00 cm LA Vol (A2C):   50.7 ml 27.72 ml/m  RA Volume:   30.80 ml  16.84 ml/m LA Vol (A4C):   64.5 ml 35.27 ml/m LA Biplane Vol: 57.1 ml 31.22 ml/m  AORTIC VALVE                     PULMONIC VALVE AV Area (Vmax):    1.35 cm      PR End Diast Vel: 0.96 msec AV Area (Vmean):   1.27 cm AV Area (VTI):     1.35 cm AV Vmax:           167.20 cm/s AV Vmean:          108.520 cm/s AV VTI:            0.246 m AV Peak Grad:      11.2 mmHg AV Mean Grad:      7.5 mmHg LVOT Vmax:         63.40 cm/s LVOT Vmean:        38.800 cm/s LVOT VTI:          0.094 m LVOT/AV VTI ratio: 0.38  AORTA Ao Root diam: 2.98 cm Ao Asc diam:  3.86 cm MITRAL VALVE                  TRICUSPID VALVE MV Area (PHT): 6.93 cm        TR Peak grad:   26.8 mmHg MV Area VTI:   0.87 cm       TR Vmax:        259.00 cm/s MV Peak grad:  5.8 mmHg MV Mean grad:  2.0 mmHg       SHUNTS MV Vmax:       1.20 m/s       Systemic VTI:  0.09 m MV Vmean:      72.1 cm/s      Systemic Diam: 2.13 cm MV Decel Time: 110 msec MR Peak grad:    74.0 mmHg MR Mean grad:    46.0 mmHg MR Vmax:         430.00 cm/s MR Vmean:        314.0 cm/s MR PISA:         2.32 cm MR PISA Eff ROA: 20 mm MR PISA Radius:  0.61 cm MV E velocity: 144.50 cm/s Kardie Tobb DO Electronically signed by Dub Huntsman DO Signature Date/Time: 11/07/2024/1:16:40 PM    Final    IR THORACENTESIS RIGHT ASP PLEURAL SPACE W/IMG GUIDE Result Date: 11/07/2024 INDICATION: 84 year old male who is COVID+ presents with right pleural effusion. Received request for diagnostic and therapeutic thoracentesis. This is patient's first thoracentesis. EXAM: ULTRASOUND GUIDED DIAGNOSTIC AND THERAPEUTIC, RIGHT-SIDED THORACENTESIS MEDICATIONS: 8 mL 1% lidocaine  with epinephrine  COMPLICATIONS: None immediate. PROCEDURE: An ultrasound guided thoracentesis was thoroughly discussed with the patient and questions answered. The benefits, risks, alternatives and complications were also discussed. The patient understands and wishes to proceed with the procedure. Written consent was obtained. Ultrasound was performed to localize and mark an adequate pocket of fluid in the right chest. The area was then prepped and draped in the normal sterile fashion. 1% lidocaine  with epinephrine  was used for local anesthesia. Under ultrasound guidance  a 6 Fr Safe-T-Centesis catheter was introduced. Thoracentesis was performed. The catheter was removed and a dressing applied. FINDINGS: A total of approximately 1 liter of clear yellow fluid was removed. Samples were sent to the laboratory as requested by the clinical team. IMPRESSION: Successful ultrasound guided right thoracentesis yielding 1 liter of pleural fluid. Performed by: Kristi  Davenport, NP under the supervision of Dr. Juliene Balder Electronically Signed   By: Juliene Balder M.D.   On: 11/07/2024 10:41   DG Chest Port 1 View Result Date: 11/07/2024 EXAM: 1 VIEW XRAY OF THE CHEST 11/07/2024 09:22:00 AM COMPARISON: 11/06/2024 CLINICAL HISTORY: 84 year old male. Status post thoracentesis. 1 liter of right side pleural fluid removed this morning. FINDINGS: LUNGS AND PLEURA: Improved aeration of right lung base with persistent moderate basilar opacities. No pneumothorax. Incidental skin fold artifact in the mid left chest. HEART AND MEDIASTINUM: Aortic atherosclerosis. No acute abnormality of the cardiac and mediastinal silhouettes. BONES AND SOFT TISSUES: Surgical anchors in right humeral head. No acute osseous abnormality. IMPRESSION: 1. Improved right lung ventilation and no pneumothorax following thoracentesis. Electronically signed by: Helayne Hurst MD 11/07/2024 09:34 AM EST RP Workstation: [id number]    Medications:   Current Medications:  azithromycin   500 mg Oral Daily   heparin   3,000 Units Intravenous Once   insulin  aspart  0-9 Units Subcutaneous Q4H   insulin  glargine-yfgn  5 Units Subcutaneous QHS   lidocaine -EPINEPHrine   20 mL Intradermal Once   pantoprazole  (PROTONIX ) IV  40 mg Intravenous Q12H   sodium chloride  flush  3 mL Intravenous Q12H    Infusions:  cefTRIAXone  (ROCEPHIN )  IV 2 g (11/07/24 1151)   DOBUTamine      heparin        Assessment/Plan   1. Acute on chronic systolic HF -> cardiogenic shock  - Echo 8/25 EF 40-45% triv MR moderate AS, CKD IV baseline SCr 1.5-2.0 - Echo 11/07/24 EF < 20% RV severely hk severe MR mod-severe AS. - ? If worsening EF driven by AF - Now in cardiogenic shock with elevated lactic acid - PICC placed - Start DBA 2.5 - Follow CVP and Co-ox - Very debilitated at baseline. Not candidate for advanced therapies but if HF driven by AF may benefit from short-term inotropic support followed by DC-CV  2. AKI on CKD 3 - likely due  to ATN - supportive care  3. PAF - continue heparin  - start IV amio - possible TEE/DCCV  4. 3v CAD - by cath 7/24 severe 3v disease . Turned down for CABG - no current s/s ischemia  5. Severe MR/moderate AS - MR likely functional. Should improve with HF support  6. ? RV clot - on heparin    CRITICAL CARE Performed by: Cherrie Sieving  Total critical care time: 50 minutes  Critical care time was exclusive of separately billable procedures and treating other patients.  Critical care was necessary to treat or prevent imminent or life-threatening deterioration.  Critical care was time spent personally by me (independent of midlevel providers or residents) on the following activities: development of treatment plan with patient and/or surrogate as well as nursing, discussions with consultants, evaluation of patient's response to treatment, examination of patient, obtaining history from patient or surrogate, ordering and performing treatments and interventions, ordering and review of laboratory studies, ordering and review of radiographic studies, pulse oximetry and re-evaluation of patient's condition.  Length of Stay: 1  Sieving Cherrie, MD  11/07/2024, 10:19 PM  Advanced Heart Failure Team Pager [phone] (M-F; 7a - 5p)  Please visit [email or web address]: For overnight coverage please call cardiology fellow first. If fellow not available call Shock/ECMO MD on call.  For ECMO / Mechanical Support (Impella, IABP, LVAD) issues call Shock / ECMO MD on call.       [1]  Allergies Allergen Reactions   Zetia [Ezetimibe] Other (See Comments)    Myalgias    Zocor [Simvastatin] Other (See Comments)    Myalgias     Dilaudid  [Hydromorphone  Hcl] Other (See Comments)    Hallucinations    Entresto  [Sacubitril -Valsartan ] Other (See Comments)    Discontinued by MD due to persistent hypotension    Imdur  [Isosorbide  Nitrate] Other (See Comments)    Discontinued by MD due to persistent  hypotension with isosorbide  ER 60mg .   Toprol  Xl [Metoprolol ] Other (See Comments)    Discontinued by MD due to persistent hypotension at 12.5mg     "

## 2024-11-07 NOTE — Progress Notes (Signed)
 PT Cancellation Note  Patient Details Name: Keith Espinoza MRN: [id number] DOB: [birth year]   Cancelled Treatment:    Reason Eval/Treat Not Completed: (P) Patient at procedure or test/unavailable. RN reporting IR on their way to get pt at this time, requesting PT check back later if time permits.   Theo Ferretti, PT, DPT Acute Rehabilitation Services  Office: [phone]    Theo CHRISTELLA Ferretti 11/07/2024, 7:59 AM

## 2024-11-07 NOTE — Progress Notes (Signed)
 Heart Failure Navigator Progress Note  Assessed for Heart & Vascular TOC clinic readiness.  Patient does not meet criteria due to he has a scheduled [facility] appointment on 11/16/2024. No HF TOC. .   Navigator will sign off at this time.   Stephane Haddock, BSN, Scientist, Clinical (histocompatibility And Immunogenetics) Only

## 2024-11-07 NOTE — Progress Notes (Signed)
 Telephone consent obtained from the daughter Ida Somerset for PICC.

## 2024-11-07 NOTE — Progress Notes (Signed)
" °  Echocardiogram 2D Echocardiogram has been performed.  Keith Espinoza 11/07/2024, 11:13 AM "

## 2024-11-07 NOTE — Procedures (Signed)
 PROCEDURE SUMMARY:  Successful image-guided diagnostic and therapeutic thoracentesis from the right chest.  Yielded 1 liter of clear yellow fluid.  No immediate complications.  EBL: zero Patient tolerated well.   Specimen sent for labs.  Post-procedure CXR ordered and obtained prior to departure from department.   Please see imaging section of Epic for full dictation.  Makena Mcgrady B Beryle Bagsby NP 11/07/2024 9:33 AM

## 2024-11-07 NOTE — Progress Notes (Signed)
 Initial Nutrition Assessment  DOCUMENTATION CODES:  Severe malnutrition in context of chronic illness  INTERVENTION:  Diet recommendation per SLP; recommendation for MBS  Monitor for diet advancement and augment with nutrition supplements as medically appropriate.   NUTRITION DIAGNOSIS:  Severe Malnutrition related to chronic illness (CHF, prolonged inadequate oral intake) as evidenced by severe fat depletion, severe muscle depletion.  GOAL:  Patient will meet greater than or equal to 90% of their needs  MONITOR:  Diet advancement, Labs, Weight trends  REASON FOR ASSESSMENT:  Consult Assessment of nutrition requirement/status  ASSESSMENT:  Pt presented with c/o nausea, vomiting, abdominal pain; admitted with CHF.  PMH significant for diffuse coronary artery disease, HTN, DM2, HLD, combined congestive diastolic and systolic CHF, CKD, right BKA.  2/4: thoracentesis- yield 1L  Per GI evaluation today, suspected that some GI symptoms are d/t severe heart failure/cardiac issues. No urgent GI evaluation warranted at this time.   Spoke with pt at bedside. He remains with his eyes shut and curled up sideways in bed though provides nutrition history.   He lives at home alone but reports that family lives close by. He states that over the last 1-2 weeks, his intake has been very poor as he began experiencing emesis with intake of food and water. At baseline he reports that he generally eats 3 meals per day. His daughter will prepare bulk meals and provide for him to heat up throughout the week.   Pt questions whether his oxygen requirements have been the reason for his diet intolerance and emesis as he reports tolerance to sips with meds and ice chips.   Of note, pt recommended for MBS which was planned today however pt reports refusal d/t family history of traumatic event surrounding MBS. Discussed with MD and SLP.   Noted pt primarily reliant on wheelchair for mobility.   He states  that over the last 2-3 weeks he has lost ~20 lbs d/t inability to tolerate adequate nutrition.  Review of chart reflects and overall down trend in weight within the last year as his weight in March 2025 noted to be 75.3 kg. However Since December weight trends appear to have increased slightly which could be related to pleural effusion and fluid status. Will continue to monitor trends throughout admission.   Medications: SSI 0-9 units q4h, glargine 5 units daily, protonix , abx  Labs:  Potassium 3.0 BUN 42 Cr 1.89 Anion gap 17 GFR 35 CBG's 325-398 x24 hours HgbA1c 8.7%   NUTRITION - FOCUSED PHYSICAL EXAM: Flowsheet Row Most Recent Value  Orbital Region Severe depletion  Upper Arm Region Severe depletion  Thoracic and Lumbar Region Severe depletion  Buccal Region Severe depletion  Temple Region Severe depletion  Clavicle Bone Region Severe depletion  Clavicle and Acromion Bone Region Severe depletion  Scapular Bone Region Severe depletion  Dorsal Hand Severe depletion  Patellar Region Severe depletion  Anterior Thigh Region Severe depletion  Posterior Calf Region Severe depletion  Edema (RD Assessment) None  Hair Reviewed  Eyes Unable to assess  Mouth Unable to assess  Skin Reviewed  Nails Reviewed    Diet Order:   Diet Order             Diet NPO time specified Except for: Sips with Meds  Diet effective now                   EDUCATION NEEDS:  Not appropriate for education at this time  Skin:  Skin Assessment: Reviewed RN Assessment  Last BM:  2/3  Height:  Ht Readings from Last 1 Encounters:  11/06/24 5' 9 (1.753 m)    Weight:  Wt Readings from Last 1 Encounters:  11/06/24 68.1 kg   BMI:  Body mass index is 22.17 kg/m.  Estimated Nutritional Needs:   Kcal:  1700-1900  Protein:  85-100g  Fluid:  >/=1.7L  Royce Maris, RDN, LDN Clinical Nutrition See AMiON for contact information.

## 2024-11-07 NOTE — Progress Notes (Signed)
 PHARMACY - ANTICOAGULATION CONSULT NOTE  Pharmacy Consult for heparin  Indication: possible RV thrombus   Allergies[1]  Patient Measurements: Height: 5' 9 (175.3 cm) Weight: 68.1 kg (150 lb 2.1 oz) IBW/kg (Calculated) : 70.7 HEPARIN  DW (KG): 68.1  Vital Signs: Temp: 98.1 F (36.7 C) (02/04 0752) Temp Source: Oral (02/04 0752) BP: 102/81 (02/04 0800) Pulse Rate: 82 (02/04 0752)  Labs: Recent Labs    11/06/24 1137 11/06/24 1145 11/06/24 2105 11/07/24 0246  HGB 9.7* 10.9*  --  10.5*  HCT 30.7* 32.0*  --  34.3*  PLT 91*  --   --  99*  LABPROT 20.1*  --   --   --   INR 1.6*  --   --   --   CREATININE 1.97*  --  1.91* 1.89*    Estimated Creatinine Clearance: 28 mL/min (A) (by C-G formula based on SCr of 1.89 mg/dL (H)).   Medical History: Past Medical History:  Diagnosis Date   Allergic rhinitis    Anemia    Aortic stenosis    Arthritis    Basal cell carcinoma 11/01/2019    bcc left chest treatment TX cx3 [id number]    Below-knee amputation of right lower extremity (HCC) 12/12/2020   CHF (congestive heart failure) (HCC)    Chronic leg pain    right   Chronic lower back pain    Coronary artery disease    a. Stenting to RCA 2004; staged DES to LAD and Cx 2004. DES to [facility] 2012. b. DES to mCx, PTCA to dCx 11/2011. c. Lateral wall MI 2013 s/p PTCA to distal Cx & DES to mid OM2 11/2011. d. Low risk nuc 04/2014, EF wnl.   COVID-19    Dehiscence of amputation stump (HCC)    Diabetes mellitus    Insulin  dependent   Diabetic neuropathy (HCC)    MILD   Diverticulosis    Dysrhythmia    Edema of both lower extremities 07/05/2023   Fecal impaction (HCC) 02/01/2021   Bertrum syndrome    Gout    right wrist; right foot; right elbow; have had it since 1970's   H/O hiatal hernia    Heart murmur    Hip dislocation, right (HCC) 04/13/2019   History of echocardiogram    aortic sclerosis per echo 12/09 EF 65%, otherwise normal   History of hemorrhoids    BLEEDING   History of  kidney stones    h/o   Hypertension    Diagnosed 1995    Myocardial infarction [facility])    Pancreatic pseudocyst    a. s/p remote drainage 2006.   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 He was admitted for failure to thrive and also found to have large pleural effusion. No anticoagulation prior to admission. Pharmacy consulted for heparin .     Goal of Therapy:  Heparin  level 0.3-0.7 units/ml Monitor platelets by anticoagulation protocol: Yes   Plan:  Heparin  3000 unit bolus then 1050 units/hr Monitor daily heparin  level, CBC, signs/symptoms of bleeding    Jinnie Door, PharmD, BCPS, BCCP Clinical  Pharmacist  Please check AMION for all [facility] Pharmacy phone numbers After 10:00 PM, call Main Pharmacy [phone]      [1]  Allergies Allergen Reactions   Zetia [Ezetimibe] Other (See Comments)    Myalgias    Zocor [Simvastatin] Other (See Comments)    Myalgias     Dilaudid  [Hydromorphone  Hcl] Other (See Comments)    Hallucinations    Entresto  [Sacubitril -Valsartan ] Other (See Comments)    Discontinued by MD due to persistent hypotension    Imdur  [Isosorbide  Nitrate] Other (See Comments)    Discontinued by MD due to persistent hypotension with isosorbide  ER 60mg .   Toprol  Xl [Metoprolol ] Other (See Comments)    Discontinued by MD due to persistent hypotension at 12.5mg 

## 2024-11-07 NOTE — Progress Notes (Addendum)
 " PROGRESS NOTE    Keith Espinoza  FMW:[id number] DOB: 10-28-1940 DOA: 11/06/2024 PCP: Charlott Dorn LABOR, MD   Brief Narrative:  Keith Espinoza is a 84 y.o. 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    Patient presents with complaints of nausea vomiting abdominal pain with poor p.o. intake over the past few weeks - worsening over the past few days.  Reports losing weight over the past month due to poor p.o. intake and is now wheelchair-bound -denies dysphagia or odynophagia just poor appetite.  Imaging at intake noted large right lower lobe layering effusion.  Assessment & Plan:   Principal Problem:   Acute on chronic heart failure (HCC) Active Problems:   Loss of appetite   Hyperlipidemia   Type 2 diabetes mellitus with hyperglycemia, with long-term current use of insulin  (HCC)   Chronic heart failure with mildly reduced ejection fraction (HFmrEF, 41-49%) (HCC)   Dysphagia   Coronary artery disease   Paroxysmal A-fib (HCC)   Aspiration pneumonia (HCC)   Moderate sized pleural effusion   Prolonged QT interval   Pleural effusion   Severe sepsis secondary to aspiration pneumonia, right  Concurrent large right pleural effusion, questionably parapneumonic Acute hypoxic respiratory failure -Severe sepsis criteria met with tachypnea, tachycardia notable source, noted lactic acidosis - Continue azithromycin , ceftriaxone ; low threshold to broaden coverage if no improvement in the next 24 to 48 hours - Supportive care ongoing - IR consulted for thoracentesis, right, labs pending - High likelihood patient's pneumonia is aspiration and fluid is parapneumonic - Cultures pending - Strep pneumo  UA negative - Continue to wean oxygen as appropriate currently on 2 L nasal cannula(baseline on room air)             Intractable abdominal pain - post prandial - Prior upper endoscopy 2024 for hematemesis showed small bleed but otherwise unremarkable - Patient initially refusing EGD or modified barium as his daughter had that procedure and it killed her - Daughter at bedside discussed with patient who appears to be agreeable for EGD if deemed necessary by GI - **Discussed case with GI - will start with mesenteric US  to evaluate for ischemia - if inconclusive will discuss CT w/ contrast but risks of worsening kidney function are not insignificant** - Continue to escalate patient's antacid regimen given concern for possible ulcer if he refuses further testing/intervention and likely transition towards a palliative trajectory, discussed with daughter who is agreeable  Chronic heart failure with newly reduced EF from prior - Appears euvolemic - Pleural effusion likely secondary to above infection given unilateral without systemic edema - Hold further diuretics - Volume status complicated by malnutrition, albumin  low normal **Echo shows new EF less than 20% with questionable right ventricular thrombus, cardiology consulted for insight and recommendations.  Moderate to severe protein caloric malnutrition Rule out underlying dysphagia - Unclear etiology, patient reports poor appetite over the past few weeks if not months - Continue supplementation, dietary to follow - SLP following, plan for modified barium swallow, patient now agreeable  Chronic anemia of chronic disease - Fecal occult negative 11/06/24  Hyperlipidemia- Continue Crestor  10 mg a day  Type 2 diabetes mellitus uncontrolled with hyperglycemia, with long-term current use of insulin  (HCC) -Continue sliding scale insulin , hypoglycemic protocol,  A1c 8.7    Coronary artery disease Stable continue Crestor  10 mg a day -noncompliant  with Plavix   Paroxysmal A-fib (HCC) Not on anticoagulation  Prolonged QT interval - History of, continue to follow  DVT prophylaxis: SCDs Start: 11/06/24 2113 Code Status:   Code Status: Limited: Do not attempt resuscitation (DNR) -DNR-LIMITED -Do Not Intubate/DNI  Family Communication: Daughter at bedside  Status is: Inpatient  Dispo: The patient is from: Home              Anticipated d/c is to: To be determined              Anticipated d/c date is: 48 to 72 hours              Patient currently not medically stable for discharge  Consultants:  GI  Procedures:  Pending  Antimicrobials:  None indicated  Subjective: No acute issues or events overnight denies headache fever chills chest pain shortness of breath, nausea ongoing but vomiting and abdominal pain appear to be improving while NPO.  Objective: Vitals:   11/06/24 1823 11/06/24 1900 11/06/24 2356 11/07/24 0431  BP: 96/76 96/75 (!) 93/58 95/75  Pulse: 86 85 96 87  Resp: 17 13 16 18   Temp: 97.7 F (36.5 C) 97.9 F (36.6 C) 98 F (36.7 C) 97.9 F (36.6 C)  TempSrc: Oral Oral Oral Oral  SpO2: 94% 100% 100% 96%  Weight: 68.1 kg     Height: 5' 9 (1.753 m)       Intake/Output Summary (Last 24 hours) at 11/07/2024 0715 Last data filed at 11/06/2024 1900 Gross per 24 hour  Intake 101.13 ml  Output 400 ml  Net -298.87 ml   Filed Weights   11/06/24 1034 11/06/24 1823  Weight: 62.6 kg 68.1 kg    Examination:  General: Cachectic appearing gentleman, pleasantly resting in bed, No acute distress. HEENT:  Normocephalic atraumatic.  Sclerae nonicteric, noninjected.  Extraocular movements intact bilaterally. Neck:  Without mass or deformity.  Trachea is midline. Lungs:  Clear to auscultate bilaterally without rhonchi, wheeze, or rales. Heart:  Regular rate and rhythm.  Without murmurs, rubs, or gallops. Abdomen:  Soft, nontender, nondistended.  Without guarding or rebound. Extremities: Without cyanosis, clubbing,  edema, or obvious deformity. Skin:  Warm and dry, no erythema.  Data Reviewed: I have personally reviewed following labs and imaging studies  CBC: Recent Labs  Lab 11/06/24 1137 11/06/24 1145 11/07/24 0246  WBC 5.3  --  6.3  NEUTROABS 4.2  --   --   HGB 9.7* 10.9* 10.5*  HCT 30.7* 32.0* 34.3*  MCV 93.3  --  97.4  PLT 91*  --  99*   Basic Metabolic Panel: Recent Labs  Lab 11/06/24 1137 11/06/24 1145 11/06/24 2105 11/07/24 0246  NA 137 136 137 137  K 3.3* 3.7 3.1* 3.0*  CL 97*  --  97* 100  CO2 23  --  25 21*  GLUCOSE 464*  --  438* 361*  BUN 39*  --  43* 42*  CREATININE 1.97*  --  1.91* 1.89*  CALCIUM  9.9  --  9.3 9.5  MG  --   --  1.6* 1.8  PHOS  --   --  2.8 2.5   GFR: Estimated Creatinine Clearance: 28 mL/min (A) (by C-G formula based on SCr of 1.89 mg/dL (H)). Liver Function Tests: Recent Labs  Lab 11/06/24 1137 11/07/24 0246  AST 21 16  ALT 19 17  ALKPHOS 117 110  BILITOT  1.3* 1.1  PROT 6.4* 6.3*  ALBUMIN  3.5 3.4*   Recent Labs  Lab 11/06/24 1137  LIPASE 23   Recent Labs  Lab 11/06/24 2105  AMMONIA <13   Coagulation Profile: Recent Labs  Lab 11/06/24 1137  INR 1.6*   BNP (last 3 results) Recent Labs    10/05/24 1620 10/25/24 1012 11/06/24 1137  PROBNP 27,900.0* 24,629.0* >35,000.0*   HbA1C: Recent Labs    11/07/24 0246  HGBA1C 8.7*   CBG: Recent Labs  Lab 11/06/24 2224 11/07/24 0415  GLUCAP 398* 325*   Lipid Profile: No results for input(s): CHOL, HDL, LDLCALC, TRIG, CHOLHDL, LDLDIRECT in the last 72 hours. Thyroid  Function Tests: No results for input(s): TSH, T4TOTAL, FREET4, T3FREE, THYROIDAB in the last 72 hours. Anemia Panel: Recent Labs    11/06/24 2105 11/07/24 0246  VITAMINB12  --  >4,000*  FOLATE  --  5.5*  FERRITIN 89  --   TIBC 311  --   IRON  25*  --   RETICCTPCT 2.8  --    Sepsis Labs: Recent Labs  Lab 11/06/24 1319 11/06/24 2105 11/07/24 0019 11/07/24 0246  PROCALCITON   --  0.25  --   --   LATICACIDVEN 3.6* 3.3* 3.5* 4.9*    No results found for this or any previous visit (from the past 240 hours).       Radiology Studies: CT Chest Wo Contrast Result Date: 11/06/2024 EXAM: CT CHEST WITHOUT CONTRAST 11/06/2024 11:54:23 AM TECHNIQUE: CT of the chest was performed without the administration of intravenous contrast. Multiplanar reformatted images are provided for review. Automated exposure control, iterative reconstruction, and/or weight based adjustment of the mA/kV was utilized to reduce the radiation dose to as low as reasonably achievable. COMPARISON: None available. CLINICAL HISTORY: Pulm edema, PNA? Pulmonary edema; query pneumonia. FINDINGS: MEDIASTINUM: Heart and pericardium are unremarkable. Coronary artery calcifications noted. No pericardial effusion. The central airways are clear. LYMPH NODES: No mediastinal, hilar or axillary lymphadenopathy. LUNGS AND PLEURA: Moderate large layering right pleural effusion. There is atelectasis and ground glass density in the right lower lobe suggesting potential aspiration or pneumonia superimposed on the atelectasis. No pneumothorax. SOFT TISSUES/BONES: No acute abnormality of the bones or soft tissues. No fracture. UPPER ABDOMEN: Limited images of the upper abdomen demonstrate mild ascites. 2 cm benign adenoma of the left adrenal gland. IMPRESSION: 1. Moderate to large layering right pleural effusion. 2. Right lower lobe atelectasis with ground-glass opacity, which may represent aspiration or pneumonia superimposed on atelectasis. Electronically signed by: Norleen Boxer MD 11/06/2024 12:08 PM EST RP Workstation: [id number]   CT ABDOMEN PELVIS WO CONTRAST Result Date: 11/06/2024 EXAM: CT ABDOMEN AND PELVIS WITHOUT CONTRAST 11/06/2024 11:54:23 AM TECHNIQUE: CT of the abdomen and pelvis was performed without the administration of intravenous contrast. Multiplanar reformatted images are provided for review. Automated exposure  control, iterative reconstruction, and/or weight-based adjustment of the mA/kV was utilized to reduce the radiation dose to as low as reasonably achievable. COMPARISON: None available. CLINICAL HISTORY: Abdominal pain, acute, nonlocalized. Acute, nonlocalized abdominal pain. FINDINGS: LOWER CHEST: Moderate to large right pleural effusion with right lobe atelectasis versus infiltrate. Might exclude right lower lobe infection. LIVER: The liver is unremarkable. GALLBLADDER AND BILE DUCTS: Cholecystectomy. No biliary ductal dilatation. SPLEEN: No acute abnormality. PANCREAS: No acute abnormality. ADRENAL GLANDS: No acute abnormality. KIDNEYS, URETERS AND BLADDER: No stones in the kidneys or ureters. No hydronephrosis. No perinephric or periureteral stranding. Ureters and bladder are normal. GI AND BOWEL: Stomach demonstrates no acute abnormality.  There is no bowel obstruction. PERITONEUM AND RETROPERITONEUM: Moderate volume of free fluid in the abdomen and pelvis. No free air. VASCULATURE: Vascular calcifications of the abdominal aorta. LYMPH NODES: No lymphadenopathy. REPRODUCTIVE ORGANS: No acute abnormality. BONES AND SOFT TISSUES: Bilateral nonobstructing renal calculi. Right hip prosthetic. No acute osseous abnormality. IMPRESSION: 1. Moderate to large right pleural effusion with right lobe atelectasis versus infiltrate. 2. Moderate volume of free fluid in the abdomen and pelvis. 3. No evidence of bowel obstruction. Electronically signed by: Norleen Boxer MD 11/06/2024 12:04 PM EST RP Workstation: [id number]   DG Chest Port 1 View Result Date: 11/06/2024 CLINICAL DATA:  Questionable sepsis. EXAM: PORTABLE CHEST 1 VIEW COMPARISON:  October 05, 2024 FINDINGS: The cardiac silhouette is mildly enlarged and unchanged in size. A coronary artery stent is in place. There is marked severity calcification of the aortic arch. Low lung volumes are noted. Diffusely increased interstitial lung markings are seen with marked  severity atelectasis and/or infiltrate noted within the right lung base. A moderate size right-sided pleural effusion is also present. No pneumothorax is identified. Multilevel degenerative changes seen throughout the thoracic spine. IMPRESSION: 1. Stable cardiomegaly with mild interstitial edema. 2. Marked severity right basilar atelectasis and/or infiltrate. 3. Moderate sized right-sided pleural effusion. Electronically Signed   By: Suzen Dials M.D.   On: 11/06/2024 11:26        Scheduled Meds:  azithromycin   500 mg Oral Daily   insulin  aspart  0-9 Units Subcutaneous Q4H   insulin  glargine-yfgn  5 Units Subcutaneous QHS   pantoprazole  (PROTONIX ) IV  40 mg Intravenous Q12H   sodium chloride  flush  3 mL Intravenous Q12H   Continuous Infusions:  sodium chloride  Stopped (11/06/24 1337)   sodium chloride      cefTRIAXone  (ROCEPHIN )  IV       LOS: 1 day   Time spent:  Keith JAYSON [place], DO [facility]  If 7PM-7AM, please contact night-coverage [email or web address]  11/07/2024, 7:15 AM      "

## 2024-11-07 NOTE — Progress Notes (Signed)
 Patient to IR

## 2024-11-07 NOTE — Progress Notes (Signed)
 MD notified of patient's  low blood pressure and refusal of swallow test. MD aware that patient heading to IR. No orders received at this time.

## 2024-11-07 NOTE — Progress Notes (Signed)
 Physical Therapy Evaluation Patient Details Name: Keith Espinoza MRN: [id number] DOB: [birth year] Today's Date: 11/07/2024  History of Present Illness  84 y.o. male presented to [facility] ED on 11/06/24 with complaints of weakness, nausea/vomiting, abdominal pain. Pt was admitted 2/3 for CHF and volume overload with pleural effusion and respiratory failure.  PMH includes R BKA (2022), CAD, CHF, DM, HTN, vertigo.  Clinical Impression  Pt presents with the conditions above and deficits below. PTA, pt was living at home and using wheelchair for mobility and at times needing assistance with ADLs such as bathing and dressing. Currently, pt is mod A for bed mobility requiring hand held assist with the bilateral UE to get to EOB. Pt is unable to come to a full stand even with total A to go from bed to chair. Pt is able to perform a lateral scoot transfer mod A going towards his right side but still needing extensive cues. Pt is still limited by activity tolerance, endurance, R lower extremity strength, ROM, balance, and mobility. Pt would likely benefit from inpatient rehab <3hrs/day to d/c home as pt has had functional decline, increased risk of falls, and limited support at home. Will continue to follow acutely.       If plan is discharge home, recommend the following: A lot of help with walking and/or transfers;A lot of help with bathing/dressing/bathroom;Assistance with cooking/housework;Direct supervision/assist for medications management;Assist for transportation;Help with stairs or ramp for entrance;Direct supervision/assist for financial management   Can travel by private vehicle   No    Equipment Recommendations BSC/3in1;Hospital bed (Drop arm BSC if pt does not have one.)  Recommendations for Other Services  OT consult    Functional Status Assessment Patient has had a recent decline in their functional status and demonstrates the ability to make significant improvements in function in a  reasonable and predictable amount of time.     Precautions / Restrictions Precautions Precautions: Fall Recall of Precautions/Restrictions: Impaired Precaution/Restrictions Comments: h/o R BKA (prosthetic in room) and wheelchair bound but no wheelchair in room. Required Braces or Orthoses: Other Brace Other Brace: R prosthesis Restrictions Weight Bearing Restrictions Per Provider Order: No      Mobility  Bed Mobility Overal bed mobility: Needs Assistance Bed Mobility: Supine to Sit     Supine to sit: Mod assist, HOB elevated     General bed mobility comments: Pt was mod A needing hand held assist of the bilateral UE to come to EOB.    Transfers Overall transfer level: Needs assistance Equipment used: None Transfers: Sit to/from Stand, Bed to chair/wheelchair/BSC Sit to Stand: Total assist          Lateral/Scoot Transfers: Mod assist General transfer comment: Pt was total assist for sit to stand without AD. Pt was instructed to hold on therapist arms for assistance and pt stated  I cant do that, I need something thats steady to hold onto. When pt was prompted to use a RW, pt stated Why would I need that when I can barely stand. Pt eventually held onto therapist arms bilaterally. Pt was unable to come to a full stand and had to sit back on the bed despite extensive cues to put feet back instead of having feet far away. Pt was mod A for lateral scoot transfer needing tactile cues at the hips to laterally scoot from his R side. Pt initally wanted to try to towards his left side but transfer was deferred due to increased risk of fall due to inability  to drop the arm rest on that side.    Ambulation/Gait               General Gait Details: Deferred at this time.  Stairs            Wheelchair Mobility     Tilt Bed    Modified Rankin (Stroke Patients Only)       Balance Overall balance assessment: Needs assistance Sitting-balance support: No upper  extremity supported, Feet supported Sitting balance-Leahy Scale: Good Sitting balance - Comments: Pt had no loss of balance and was able to reach off center of gravity to put prosthesis on.     Standing balance-Leahy Scale: Zero Standing balance comment: Pt unable to stand                             Pertinent Vitals/Pain Pain Assessment Pain Assessment: Faces Faces Pain Scale: Hurts a little bit Pain Location: R lower extremity Pain Descriptors / Indicators: Discomfort, Grimacing Pain Intervention(s): Limited activity within patient's tolerance, Monitored during session    Home Living Family/patient expects to be discharged to:: Private residence Living Arrangements: Alone Available Help at Discharge: Family;Available PRN/intermittently Type of Home: House Home Access: Level entry       Home Layout: Able to live on main level with bedroom/bathroom Home Equipment: Rolling Walker (2 wheels);BSC/3in1;Shower seat;Cane - single point;Wheelchair - manual;Wheelchair - power;Hand held shower head;Grab bars - tub/shower;Grab bars - toilet;Adaptive equipment;Lift chair;Other (comment) Additional Comments: daughter reports pt does not like to use BSC (so not interested in drop-arm [facility]), doesn't like slide boards (per recent prior entry.)    Prior Function Prior Level of Function : Needs assist       Physical Assist : Mobility (physical);ADLs (physical) Mobility (physical): Bed mobility;Transfers;Gait   Mobility Comments: pt and daughter report that pt can perform lateral scoot to wheelchair; assist for stand pivot transfer to toilet and shower chair; reports wearing RLE prosthetic all of the time. daughter reports pt was ambulating a few months ago, but mobility and desire to move has gone down, pt also not eating. recently finished HHPT ADLs Comments: Pt functions at w/c-level with ADLS and has had some increase in assist with showers from daughter due to weakness      Extremity/Trunk Assessment   Upper Extremity Assessment Upper Extremity Assessment: Defer to OT evaluation    Lower Extremity Assessment Lower Extremity Assessment: RLE deficits/detail RLE Deficits / Details: h/o R BKA. Decreased hip abduction and adduction strength with a MMT of 3+/5 and limited hip abduction/adduction ROM. R knee extension ROM WFL but MMT was 4/5. RLE Sensation: WNL RLE Coordination: decreased gross motor    Cervical / Trunk Assessment Cervical / Trunk Assessment: Kyphotic  Communication   Communication Communication: Impaired Factors Affecting Communication: Hearing impaired    Cognition Arousal: Alert Behavior During Therapy: Agitated   PT - Cognitive impairments: Problem solving, Safety/Judgement, Awareness                       PT - Cognition Comments: Pt generally frustrated and unreceptive to PT. Pt states this is how I do it and why do you change it if I like to do it this way. Pt has a decreased ability to problem solve during transfers and decreased awareness of safety needing cues for sequencing. Following commands: Impaired Following commands impaired: Follows one step commands inconsistently, Follows multi-step commands inconsistently  Cueing Cueing Techniques: Verbal cues, Tactile cues     General Comments General comments (skin integrity, edema, etc.): VSS on 2L of O2. Highest HR reading was 121 and lowest O2 was 92%.    Exercises     Assessment/Plan    PT Assessment Patient needs continued PT services  PT Problem List Decreased strength;Decreased range of motion;Decreased activity tolerance;Decreased balance;Decreased coordination;Decreased mobility;Decreased knowledge of use of DME;Decreased safety awareness;Pain;Cardiopulmonary status limiting activity       PT Treatment Interventions DME instruction;Functional mobility training;Therapeutic activities;Therapeutic exercise;Balance training;Patient/family  education;Wheelchair mobility training;Neuromuscular re-education    PT Goals (Current goals can be found in the Care Plan section)  Acute Rehab PT Goals Patient Stated Goal: to go home PT Goal Formulation: With patient Time For Goal Achievement: 11/21/24 Potential to Achieve Goals: Fair    Frequency Min 2X/week     Co-evaluation               AM-PAC PT 6 Clicks Mobility  Outcome Measure Help needed turning from your back to your side while in a flat bed without using bedrails?: A Lot Help needed moving from lying on your back to sitting on the side of a flat bed without using bedrails?: A Lot Help needed moving to and from a bed to a chair (including a wheelchair)?: A Lot Help needed standing up from a chair using your arms (e.g., wheelchair or bedside chair)?: Total Help needed to walk in hospital room?: Total Help needed climbing 3-5 steps with a railing? : Total 6 Click Score: 9    End of Session Equipment Utilized During Treatment: Gait belt;Oxygen Activity Tolerance: Patient limited by pain;Other (comment) (Pt was very agitated.) Patient left: in chair;with call bell/phone within reach;with chair alarm set Nurse Communication: Mobility status PT Visit Diagnosis: Unsteadiness on feet (R26.81);Other abnormalities of gait and mobility (R26.89);Muscle weakness (generalized) (M62.81);Difficulty in walking, not elsewhere classified (R26.2);Pain Pain - Right/Left: Right Pain - part of body: Leg    Time: 8769-8675 PT Time Calculation (min) (ACUTE ONLY): 54 min   Charges:   PT Evaluation $PT Eval Moderate Complexity: 1 Mod PT Treatments $Therapeutic Activity: 38-52 mins PT General Charges $$ ACUTE PT VISIT: 1 Visit         Murtis CHRISTELLA Ferries, SPT   Avleen Bordwell 11/07/2024, 2:34 PM

## 2024-11-07 NOTE — Progress Notes (Signed)
 Patient refused to let RN take pictures and measure his wounds at this time.

## 2024-11-07 NOTE — Plan of Care (Signed)

## 2024-11-08 ENCOUNTER — Inpatient Hospital Stay (HOSPITAL_COMMUNITY)

## 2024-11-08 DIAGNOSIS — I513 Intracardiac thrombosis, not elsewhere classified: Secondary | ICD-10-CM

## 2024-11-08 DIAGNOSIS — R9431 Abnormal electrocardiogram [ECG] [EKG]: Secondary | ICD-10-CM | POA: Diagnosis not present

## 2024-11-08 DIAGNOSIS — R1013 Epigastric pain: Secondary | ICD-10-CM

## 2024-11-08 DIAGNOSIS — I480 Paroxysmal atrial fibrillation: Secondary | ICD-10-CM | POA: Diagnosis not present

## 2024-11-08 DIAGNOSIS — J90 Pleural effusion, not elsewhere classified: Secondary | ICD-10-CM | POA: Diagnosis not present

## 2024-11-08 DIAGNOSIS — J690 Pneumonitis due to inhalation of food and vomit: Secondary | ICD-10-CM | POA: Diagnosis not present

## 2024-11-08 DIAGNOSIS — K551 Chronic vascular disorders of intestine: Secondary | ICD-10-CM | POA: Diagnosis not present

## 2024-11-08 DIAGNOSIS — R634 Abnormal weight loss: Secondary | ICD-10-CM | POA: Diagnosis not present

## 2024-11-08 DIAGNOSIS — I2510 Atherosclerotic heart disease of native coronary artery without angina pectoris: Secondary | ICD-10-CM | POA: Diagnosis not present

## 2024-11-08 DIAGNOSIS — I5084 End stage heart failure: Secondary | ICD-10-CM

## 2024-11-08 DIAGNOSIS — R570 Cardiogenic shock: Secondary | ICD-10-CM

## 2024-11-08 DIAGNOSIS — J189 Pneumonia, unspecified organism: Secondary | ICD-10-CM

## 2024-11-08 DIAGNOSIS — R638 Other symptoms and signs concerning food and fluid intake: Secondary | ICD-10-CM

## 2024-11-08 DIAGNOSIS — I5022 Chronic systolic (congestive) heart failure: Secondary | ICD-10-CM | POA: Diagnosis not present

## 2024-11-08 DIAGNOSIS — E785 Hyperlipidemia, unspecified: Secondary | ICD-10-CM | POA: Diagnosis not present

## 2024-11-08 DIAGNOSIS — R630 Anorexia: Secondary | ICD-10-CM | POA: Diagnosis not present

## 2024-11-08 LAB — CBC
HCT: 30.8 % — ABNORMAL LOW (ref 39.0–52.0)
Hemoglobin: 9.5 g/dL — ABNORMAL LOW (ref 13.0–17.0)
MCH: 29.8 pg (ref 26.0–34.0)
MCHC: 30.8 g/dL (ref 30.0–36.0)
MCV: 96.6 fL (ref 80.0–100.0)
Platelets: 70 10*3/uL — ABNORMAL LOW (ref 150–400)
RBC: 3.19 MIL/uL — ABNORMAL LOW (ref 4.22–5.81)
RDW: 17.6 % — ABNORMAL HIGH (ref 11.5–15.5)
WBC: 7.4 10*3/uL (ref 4.0–10.5)
nRBC: 0 % (ref 0.0–0.2)

## 2024-11-08 LAB — LACTIC ACID, PLASMA
Lactic Acid, Venous: 3.8 mmol/L (ref 0.5–1.9)
Lactic Acid, Venous: 2.5 mmol/L (ref 0.5–1.9)
Lactic Acid, Venous: 2.6 mmol/L (ref 0.5–1.9)

## 2024-11-08 LAB — COOXEMETRY PANEL
Carboxyhemoglobin: 0.7 % (ref 0.5–1.5)
Carboxyhemoglobin: 1.3 % (ref 0.5–1.5)
Methemoglobin: 0.7 % (ref 0.0–1.5)
Methemoglobin: <0.7 % (ref 0.0–1.5)
O2 Saturation: 34.8 %
O2 Saturation: 52.1 %
Total hemoglobin: 10.2 g/dL — ABNORMAL LOW (ref 12.0–16.0)
Total hemoglobin: 10.3 g/dL — ABNORMAL LOW (ref 12.0–16.0)

## 2024-11-08 LAB — PROTEIN, BODY FLUID (OTHER): Total Protein, Body Fluid Other: 2.2 g/dL

## 2024-11-08 LAB — BASIC METABOLIC PANEL WITH GFR
Anion gap: 12 (ref 5–15)
BUN: 41 mg/dL — ABNORMAL HIGH (ref 8–23)
CO2: 25 mmol/L (ref 22–32)
Calcium: 8.9 mg/dL (ref 8.9–10.3)
Chloride: 100 mmol/L (ref 98–111)
Creatinine, Ser: 1.84 mg/dL — ABNORMAL HIGH (ref 0.61–1.24)
GFR, Estimated: 36 mL/min — ABNORMAL LOW
Glucose, Bld: 182 mg/dL — ABNORMAL HIGH (ref 70–99)
Potassium: 3.5 mmol/L (ref 3.5–5.1)
Sodium: 137 mmol/L (ref 135–145)

## 2024-11-08 LAB — GLUCOSE, CAPILLARY
Glucose-Capillary: 159 mg/dL — ABNORMAL HIGH (ref 70–99)
Glucose-Capillary: 162 mg/dL — ABNORMAL HIGH (ref 70–99)
Glucose-Capillary: 165 mg/dL — ABNORMAL HIGH (ref 70–99)
Glucose-Capillary: 117 mg/dL — ABNORMAL HIGH (ref 70–99)
Glucose-Capillary: 115 mg/dL — ABNORMAL HIGH (ref 70–99)
Glucose-Capillary: 123 mg/dL — ABNORMAL HIGH (ref 70–99)
Glucose-Capillary: 151 mg/dL — ABNORMAL HIGH (ref 70–99)

## 2024-11-08 LAB — GLUCOSE, BODY FLUID OTHER: Glucose, Body Fluid Other: 287 mg/dL

## 2024-11-08 LAB — ALBUMIN, FLUID (OTHER): Albumin, Body Fluid Other: 1.6 g/dL

## 2024-11-08 LAB — HEPARIN LEVEL (UNFRACTIONATED): Heparin Unfractionated: 0.3 [IU]/mL (ref 0.30–0.70)

## 2024-11-08 MED ORDER — PROMETHAZINE (PHENERGAN) 6.25MG IN NS 50ML IVPB
6.2500 mg | INTRAVENOUS | Status: AC
  Administered 2024-11-08 – 2024-11-09 (×9): 6.25 mg via INTRAVENOUS
  Filled 2024-11-08: qty 50
  Filled 2024-11-08: qty 6.25
  Filled 2024-11-08: qty 50
  Filled 2024-11-08: qty 6.25
  Filled 2024-11-08: qty 50
  Filled 2024-11-08: qty 6.25
  Filled 2024-11-08: qty 50
  Filled 2024-11-08: qty 6.25
  Filled 2024-11-08 (×4): qty 50
  Filled 2024-11-08: qty 6.25
  Filled 2024-11-08: qty 50

## 2024-11-08 MED ORDER — POTASSIUM CHLORIDE CRYS ER 20 MEQ PO TBCR
40.0000 meq | EXTENDED_RELEASE_TABLET | Freq: Once | ORAL | Status: AC
  Administered 2024-11-08: 40 meq via ORAL
  Filled 2024-11-08: qty 2

## 2024-11-08 MED ORDER — FUROSEMIDE 10 MG/ML IJ SOLN
120.0000 mg | Freq: Two times a day (BID) | INTRAVENOUS | Status: AC
  Administered 2024-11-08 (×2): 120 mg via INTRAVENOUS
  Filled 2024-11-08: qty 120
  Filled 2024-11-08: qty 10

## 2024-11-08 MED ORDER — ENSURE PLUS HIGH PROTEIN PO LIQD: 237.0000 mL | Freq: Two times a day (BID) | ORAL | Status: AC

## 2024-11-08 MED ORDER — THIAMINE MONONITRATE 100 MG PO TABS
100.0000 mg | ORAL_TABLET | Freq: Every day | ORAL | Status: DC
  Filled 2024-11-08: qty 1

## 2024-11-08 MED ORDER — ADULT MULTIVITAMIN W/MINERALS CH
1.0000 | ORAL_TABLET | Freq: Every day | ORAL | Status: DC
  Filled 2024-11-08: qty 1

## 2024-11-08 MED ORDER — BOOST / RESOURCE BREEZE PO LIQD CUSTOM: 1.0000 | Freq: Three times a day (TID) | ORAL | Status: DC

## 2024-11-08 MED ORDER — ASPIRIN 81 MG PO CHEW
81.0000 mg | CHEWABLE_TABLET | Freq: Every day | ORAL | Status: AC
  Administered 2024-11-08: 81 mg via ORAL
  Filled 2024-11-08: qty 1

## 2024-11-08 NOTE — Progress Notes (Signed)
 Patient refused to let this RN checked his Right Below the Knee Amputation.

## 2024-11-08 NOTE — Progress Notes (Signed)
 Patient ID: Shaymus Eveleth, male   DOB: Jun 14, 1941, 84 y.o.   MRN: [id number]     Advanced Heart Failure Rounding Note  Chief Complaint: CHF     Co-ox 35% this morning with lactate 2.3 last night on dobutamine  2.5.  SBP 80s-90s.  He has gone back into NSR on amiodarone  30 mg/hr and heparin  gtt. CVP 15 on my read, creatinine 1.84.  Still with abdominal discomfort, feels tired.  He wants to go home.      Objective:   Weight Range: 68.1 kg Body mass index is 22.17 kg/m.   Vital Signs:   Temp:  [97.1 F (36.2 C)-98.2 F (36.8 C)] 97.3 F (36.3 C) (02/05 0723) Pulse Rate:  [77-89] 77 (02/05 0723) Resp:  [9-25] 18 (02/05 0723) BP: (80-105)/(53-72) 80/53 (02/05 0723) SpO2:  [100 %] 100 % (02/05 0723) Last BM Date : 11/06/24  Weight change: Filed Weights   11/06/24 1034 11/06/24 1823  Weight: 62.6 kg 68.1 kg    Intake/Output:   Intake/Output Summary (Last 24 hours) at 11/08/2024 0852 Last data filed at 11/08/2024 0351 Gross per 24 hour  Intake 100 ml  Output 220 ml  Net -120 ml     Physical Exam   General: NAD, frail Neck: JVP 14-16 cm, no thyromegaly or thyroid  nodule.  Lungs: Decreased at bases.  CV: Nondisplaced PMI.  Heart regular S1/S2, no S3/S4, no murmur.  1+ edema 1/2 to knees.  Abdomen: Soft, nontender, no hepatosplenomegaly, no distention.  Skin: Intact without lesions or rashes.  Neurologic: Alert and oriented x 3.  Psych: Normal affect. Extremities: Right BKA HEENT: Normal.   Telemetry   NSR in 60s (personally reviewed)  Labs   CBC Recent Labs    11/06/24 1137 11/06/24 1145 11/07/24 0246 11/08/24 0637  WBC 5.3  --  6.3 7.4  NEUTROABS 4.2  --   --   --   HGB 9.7*   < > 10.5* 9.5*  HCT 30.7*   < > 34.3* 30.8*  MCV 93.3  --  97.4 96.6  PLT 91*  --  99* 70*   < > = values in this interval not displayed.   Basic Metabolic Panel Recent Labs    97/96/73 2105 11/07/24 0246 11/07/24 1912 11/08/24 0637  NA 137 137 138 137  K 3.1*  3.0* 3.1* 3.5  CL 97* 100 99 100  CO2 25 21* 23 25  GLUCOSE 438* 361* 207* 182*  BUN 43* 42* 42* 41*  CREATININE 1.91* 1.89* 1.87* 1.84*  CALCIUM  9.3 9.5 8.8* 8.9  MG 1.6* 1.8  --   --   PHOS 2.8 2.5  --   --    Liver Function Tests Recent Labs    11/06/24 1137 11/07/24 0246  AST 21 16  ALT 19 17  ALKPHOS 117 110  BILITOT 1.3* 1.1  PROT 6.4* 6.3*  ALBUMIN  3.5 3.4*   Recent Labs    11/06/24 1137  LIPASE 23   Cardiac Enzymes No results for input(s): CKTOTAL, CKMB, CKMBINDEX, TROPONINI in the last 72 hours.  BNP: BNP (last 3 results) No results for input(s): BNP in the last 8760 hours.  ProBNP (last 3 results) Recent Labs    10/05/24 1620 10/25/24 1012 11/06/24 1137  PROBNP 27,900.0* 24,629.0* >35,000.0*     D-Dimer Recent Labs    11/06/24 1137  DDIMER 1.49*   Hemoglobin A1C Recent Labs    11/07/24 0246  HGBA1C 8.7*   Fasting Lipid Panel No results for  input(s): CHOL, HDL, LDLCALC, TRIG, CHOLHDL, LDLDIRECT in the last 72 hours. Medications:   Scheduled Medications:  azithromycin   500 mg Oral Daily   Chlorhexidine  Gluconate Cloth  6 each Topical Daily   insulin  aspart  0-9 Units Subcutaneous Q4H   insulin  glargine-yfgn  5 Units Subcutaneous QHS   lidocaine -EPINEPHrine   20 mL Intradermal Once   pantoprazole  (PROTONIX ) IV  40 mg Intravenous Q12H   potassium chloride   40 mEq Oral Once   sodium chloride  flush  10-40 mL Intracatheter Q12H   sodium chloride  flush  3 mL Intravenous Q12H    Infusions:  amiodarone  30 mg/hr (11/08/24 0732)   cefTRIAXone  (ROCEPHIN )  IV 2 g (11/07/24 1151)   DOBUTamine  7.5 mcg/kg/min (11/08/24 0756)   furosemide      heparin  1,050 Units/hr (11/07/24 2302)    PRN Medications: acetaminophen  **OR** acetaminophen , albuterol , fentaNYL  (SUBLIMAZE ) injection, ondansetron  **OR** ondansetron  (ZOFRAN ) IV, sodium chloride  flush, sodium chloride  flush  Assessment/Plan   1. Cardiogenic shock: Ischemic  cardiomyopathy, end stage.  Echo this admission reviewed, EF < 20%, severe RV dysfunction (I do not see a definite RV thrombus), moderate-severe MR, probably mild-moderate AS, dilated IVC.  SBP 80s-90s on dobutamine  2.5 with low co-ox 35%, lactate 2.3 last night (unclear if on dobutamine  at that point).  CVP elevated at 15, volume overloaded on exam with stable creatinine 1.84. No BP room for GDMT.    - He is not a candidate for advanced therapies or mechanical support with age, frailty, debility. He is DNR.  He wants to go home.  He is adamant that he does not want hospice as his wife died on hospice and it was a bad experience for him.  I do not think escalating care with norepinephrine and sending to the CCU is going to change our outcome any, and patient and daughter do not want care escalated.  I will take care of him as well as I can on 3E.  I am going to have the palliative medicine service see him, will see if we can work out a way to get him home without invoking hospice as he has a clear aversion to the term.  I do not think that his daughter is going to be able to care for him home by herself without hospice care/some help in the home.   - For now, will increase dobutamine  to 7.5 mcg/kg/min and repeat co-ox and lactate.  - Lasix  120 mg IV bid.  2. CKD stage 3: Suspect cardiorenal syndrome in setting of end stage CHF.  Increasing dobutamine  as above.  3. Atrial fibrillation: He was admitted in AF but he is back in NSR this morning.  This so far has not helped him much.   - Contrinue amiodarone  gtt 30 mg/hr.  - Continue heparin  gtt.  4. CAD: Diffuse 3VD on 7/24 cath, no PCI targets and deemed not to be a candidate for CABG.  No chest pain.  - Continue ASA 81.  5. Abdominal discomfort: This may be due to low flow/intestinal ischemia. Will try to maintain BP and CO as well as we can.  6. ?RV thrombus: I do not see definite RV thrombus on echo.  He will be on heparin  gtt for AF regardless.    Poor prognosis as above, patient and daughter understand this.  Would like to find a route to get him home if possible but worry that this may not be an option as he is requiring high inotrope dosing.  I worry that  this will be a hospital death.   Length of Stay: 2  Ezra Shuck, MD  11/08/2024, 8:52 AM  Advanced Heart Failure Team Pager [phone] (M-F; 7a - 5p)   Please visit [email or web address]: For overnight coverage please call cardiology fellow first. If fellow not available call Shock/ECMO MD on call.  For ECMO / Mechanical Support (Impella, IABP, LVAD) issues call Shock / ECMO MD on call.

## 2024-11-08 NOTE — TOC Progression Note (Signed)
 Transition of Care [facility]) - Progression Note    Patient Details  Name: Keith Espinoza MRN: [id number] Date of Birth: April 04, 1941  [facility]) CM/SW Contact  Waddell Barnie Rama, RN Phone Number: 11/08/2024, 4:25 PM  Clinical Narrative:    Patient is active with [facility], they will add a HHRN to [facility] services.   Expected Discharge Plan: Home w Home Health Services Barriers to Discharge: Continued Medical Work up               Expected Discharge Plan and Services       Living arrangements for the past 2 months: Single Family Home                                       Social Drivers of Health (SDOH) Interventions SDOH Screenings   Food Insecurity: No Food Insecurity (10/23/2024)  Housing: Low Risk (10/23/2024)  Transportation Needs: No Transportation Needs (10/23/2024)  Utilities: Not At Risk (10/23/2024)  Alcohol Screen: Low Risk (09/24/2024)  Depression (PHQ2-9): Low Risk (04/21/2022)  Financial Resource Strain: Low Risk (09/24/2024)  Social Connections: Socially Isolated (10/23/2024)  Stress: No Stress Concern Present (06/12/2023)   Received from [facility]  Tobacco Use: High Risk (11/06/2024)    Readmission Risk Interventions    09/25/2024   10:24 AM  Readmission Risk Prevention Plan  Transportation Screening Complete  HRI or Home Care Consult Complete  Social Work Consult for Recovery Care Planning/Counseling Complete  Palliative Care Screening Not Applicable  Medication Review Oceanographer) Referral to Pharmacy

## 2024-11-08 NOTE — Plan of Care (Signed)
  Problem: Education: Goal: Knowledge of General Education information will improve Description Including pain rating scale, medication(s)/side effects and non-pharmacologic comfort measures Outcome: Progressing   Problem: Clinical Measurements: Goal: Cardiovascular complication will be avoided Outcome: Progressing   Problem: Clinical Measurements: Goal: Respiratory complications will improve Outcome: Progressing   

## 2024-11-08 NOTE — Consult Note (Signed)
 "                                                  Palliative Care Consult Note                                  Date: 11/08/2024   Patient Name: Keith Espinoza  DOB: 07-14-1941  MRN: [id number]  Age / Sex: 84 y.o., male  PCP: Charlott Dorn LABOR, MD Referring Physician: Lue Elsie BROCKS, MD  Reason for Consultation: Establishing goals of care  HPI/Patient Profile: 84 y.o. male  with past medical history of chronic CHF, coronary artery disease, DM2, HTN, HLD, CKD, and right BKA who presented to the ED on 11/06/2024 with nausea/vomiting, abdominal pain, and poor p.o. intake.  He is admitted with acute on chronic heart failure with newly reduced EF from prior, cardiogenic shock, acute respiratory failure, and severe sepsis secondary to aspiration pneumonia.  Palliative Medicine has been consulted for goals of care discussions and complex medical decision making.   Clinical Assessment and Goals of Care:   Extensive chart review has been completed including labs, vital signs, imaging, progress/consult notes, orders, medications and available advance directive documents.  Reviewed hospitalist, heart failure, and GI progress notes from today.  Reviewed BMP noting creatinine of 1.84 today.   I met with patient and family to discuss diagnosis, prognosis, GOC, EOL wishes, disposition, and options. Patient is mostly sleeping throughout my visit, does not engage in conversation other than to say he feels wore out.  His daughter Ida and granddaughters Annabella and Isaiah are present.  Patient is currently on dobutamine , amiodarone , and heparin  infusions. Family family reports he is not eating.   Patient is known to PMT from previous hospitalizations. I re-introduced Palliative Medicine as specialized medical care for people living with serious illness. It focuses on providing relief from the symptoms and stress of a serious illness.   Created space and opportunity for patient and family to  express thoughts and feelings regarding current medical situation. Values and goals of care were attempted to be elicited.  Life Review: Patient's wife died 4 years ago (at the time she died they had been married for 62 years).  He has a daughter Ida; his other daughter died 11 years ago.  He has 2 granddaughters - Isaiah and [address].  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.  Functional Status: Patient lives at home, daughter lives nearby.  Discussion: We discussed patient's current illness and what it means in the larger context of his ongoing co-morbidities. Current clinical status was reviewed. Natural disease trajectory of end-stage heart failure was discussed.  Daughter understands from GI that it is not worthwhile to pursue CTA given his worsening renal function or EGD given high risk for sedation in the setting of severe RV failure.  Family has a realistic understanding of patient's current medical situation, and reports understanding from the medical team that it appears terminal.  They express not wanting him to struggle at end-of-life. However, they also report patient is adamantly opposed to the word hospice. Family shares that patient's wife was at an inpatient hospice facility [facility] in [place]) at end-of-life, and reports this was a very  peaceful experience.    Patient has stated his main goal is to go home. I discussed with family my concern that in his current state, it will be very difficult to get patient home without hospice support.  I have strongly recommended consideration of hospice support at home, for help with personal care as well as aggressive symptom management.  Family understands and will consider.  Discussed the importance of continued conversation with the medical team regarding overall plan of care. Questions and concerns addressed.  Emotional support provided.   Review of Systems   Constitutional:  Positive for fatigue.    Objective:   Primary Diagnoses: Present on Admission:  Hyperlipidemia  Coronary artery disease  Paroxysmal A-fib (HCC)  Aspiration pneumonia (HCC)  Chronic heart failure with mildly reduced ejection fraction (HFmrEF, 41-49%) (HCC)  Moderate sized pleural effusion  Prolonged QT interval  Loss of appetite  Pleural effusion   Physical Exam Vitals reviewed.  Constitutional:      General: He is not in acute distress.    Appearance: He is ill-appearing.  Cardiovascular:     Rate and Rhythm: Normal rate.  Pulmonary:     Effort: No respiratory distress.  Neurological:     Mental Status: He is lethargic.    Palliative Assessment/Data: PPS 20-30%     Assessment & Plan:   SUMMARY OF RECOMMENDATIONS   DNR/DNI Continue current supportive care Patient wants to go home, but this will be extremely difficult without hospice support I will follow-up with patient and family tomorrow  Primary Decision Maker: PATIENT  Prognosis:  < 2 weeks  Discharge Planning:  To Be Determined    Thank you for allowing us  to participate in the care of Joanna Hall   I personally spent a total of 75 minutes in the care of the patient today including preparing to see the patient, getting/reviewing separately obtained history, performing a medically appropriate exam/evaluation, counseling and educating, referring and communicating with other health care professionals, and documenting clinical information in the EHR.    Signed by: Recardo Loll, NP Palliative Medicine Team  Team Phone # [phone]  For individual providers, please see AMION                "

## 2024-11-08 NOTE — Plan of Care (Signed)
" °  Problem: Education: Goal: Knowledge of General Education information will improve Description: Including pain rating scale, medication(s)/side effects and non-pharmacologic comfort measures Outcome: Progressing   Problem: Health Behavior/Discharge Planning: Goal: Ability to manage health-related needs will improve Outcome: Progressing   Problem: Clinical Measurements: Goal: Will remain free from infection Outcome: Progressing Goal: Respiratory complications will improve Outcome: Progressing   Problem: Elimination: Goal: Will not experience complications related to bowel motility Outcome: Progressing Goal: Will not experience complications related to urinary retention Outcome: Progressing   Problem: Pain Managment: Goal: General experience of comfort will improve and/or be controlled Outcome: Progressing   Problem: Safety: Goal: Ability to remain free from injury will improve Outcome: Progressing   Problem: Skin Integrity: Goal: Risk for impaired skin integrity will decrease Outcome: Progressing   Problem: Clinical Measurements: Goal: Ability to maintain a body temperature in the normal range will improve Outcome: Progressing   Problem: Respiratory: Goal: Ability to maintain adequate ventilation will improve Outcome: Progressing Goal: Ability to maintain a clear airway will improve Outcome: Progressing   Problem: Education: Goal: Ability to describe self-care measures that may prevent or decrease complications (Diabetes Survival Skills Education) will improve Outcome: Progressing Goal: Individualized Educational Video(s) Outcome: Progressing   Problem: Coping: Goal: Ability to adjust to condition or change in health will improve Outcome: Progressing   Problem: Fluid Volume: Goal: Ability to maintain a balanced intake and output will improve Outcome: Progressing   Problem: Health Behavior/Discharge Planning: Goal: Ability to identify and utilize available  resources and services will improve Outcome: Progressing Goal: Ability to manage health-related needs will improve Outcome: Progressing   Problem: Metabolic: Goal: Ability to maintain appropriate glucose levels will improve Outcome: Progressing   Problem: Skin Integrity: Goal: Risk for impaired skin integrity will decrease Outcome: Progressing   Problem: Tissue Perfusion: Goal: Adequacy of tissue perfusion will improve Outcome: Progressing   "

## 2024-11-08 NOTE — TOC Initial Note (Signed)
 Transition of Care [facility]) - Initial/Assessment Note    Patient Details  Name: Keith Espinoza MRN: [id number] Date of Birth: Jan 21, 1941  Transition of Care [facility]) CM/SW Contact:    Arlana JINNY Nicholaus [place] Phone Number: [phone] 11/08/2024, 12:52 PM  Clinical Narrative:   HF CSW met with patient and family at bedside. Patient stated that he lives alone downstairs, but has family upstairs. Patient stated that  he has current HHPT/OT services with [facility]. Patient stated that he has Wheelchair, bedside, walker, and bedside commode. Patient stated that he has has a scale. Patient stated that he has a PCP. CSW explained that a hospital follow up appointment is typically scheduled closer towards dc. Patient and family agreeable. Family will provide transportation at dc.   Patient and family asked about [facility] services.  HF CSW/Unit CM will continue to follow and monitor for dc readiness.                 Expected Discharge Plan: Home w Home Health Services Barriers to Discharge: Continued Medical Work up   Patient Goals and CMS Choice Patient states their goals for this hospitalization and ongoing recovery are:: Get better CMS Medicare.gov Compare Post Acute Care list provided to:: Patient Choice offered to / list presented to : Patient [place] ownership interest in [facility].provided to:: Patient    Expected Discharge Plan and Services       Living arrangements for the past 2 months: Single Family Home                                      Prior Living Arrangements/Services Living arrangements for the past 2 months: Single Family Home Lives with:: Self Patient language and need for interpreter reviewed:: Yes        Need for Family Participation in Patient Care: Yes (Comment) Care giver support system in place?: Yes (comment)   Criminal Activity/Legal Involvement Pertinent to Current Situation/Hospitalization: No - Comment as needed  Activities of  Daily Living      Permission Sought/Granted Permission sought to share information with : Case Manager, Family Supports, PCP                Emotional Assessment Appearance:: Appears stated age Attitude/Demeanor/Rapport: Engaged Affect (typically observed): Appropriate Orientation: : Oriented to Self, Oriented to Place, Oriented to  Time, Oriented to Situation Alcohol / Substance Use: Not Applicable Psych Involvement: No (comment)  Admission diagnosis:  Pleural effusion [J90] Acute on chronic heart failure (HCC) [I50.9] Acute respiratory failure with hypoxia (HCC) [J96.01] Malaise and fatigue [R53.81, R53.83] Chronic kidney disease, unspecified CKD stage [N18.9] Acute on chronic congestive heart failure, unspecified heart failure type (HCC) [I50.9] Patient Active Problem List   Diagnosis Date Noted   Loss of weight 11/07/2024   Chronic kidney disease 11/07/2024   Cardiogenic shock (HCC) 11/07/2024   Thrombus in heart chamber 11/07/2024   Acute on chronic congestive heart failure (HCC) 11/06/2024   Aspiration pneumonia (HCC) 11/06/2024   Moderate sized pleural effusion 11/06/2024   Prolonged QT interval 11/06/2024   Pleural effusion 11/06/2024   Pressure injury of skin 10/26/2024   Hyperkalemia 10/26/2024   UTI (urinary tract infection) 10/23/2024   HFrEF (heart failure with reduced ejection fraction) (HCC) 09/23/2024   Dysphagia 09/23/2024   Elevated liver enzymes 09/23/2024   Abdominal pain 09/15/2024   Lung nodules 05/15/2024   CKD stage 3a, GFR 45-59  ml/min (HCC) 05/15/2024   Chronic heart failure with mildly reduced ejection fraction (HFmrEF, 41-49%) (HCC) 03/02/2024   Gastric AVM 07/08/2023   Diverticulosis of colon without hemorrhage 07/08/2023   PSVT (paroxysmal supraventricular tachycardia) 07/08/2023   Paroxysmal A-fib (HCC) 07/08/2023   Grade I diastolic dysfunction 07/08/2023   Hypotension 07/07/2023   Ischemic cardiomyopathy 07/07/2023   Iron   deficiency anemia 07/05/2023   Finger numbness 05/05/2023   Unstable angina (HCC) 04/23/2023   Acute kidney injury superimposed on chronic kidney disease 04/23/2023   History of CAD (coronary artery disease) 04/23/2023   Type 2 diabetes mellitus with hyperglycemia, with long-term current use of insulin  (HCC) 02/15/2022   Adenoma of left adrenal gland 02/15/2022   History of right below knee amputation (HCC) 10/06/2021   Exudative age-related macular degeneration of left eye with active choroidal neovascularization (HCC) 07/28/2021   Exudative age-related macular degeneration of right eye with active choroidal neovascularization (HCC) 06/01/2021   Intermediate stage nonexudative age-related macular degeneration of both eyes 06/01/2021   Type 2 diabetes mellitus with diabetic polyneuropathy, with long-term current use of insulin  (HCC) 03/24/2021   Constipation 02/02/2021   Type II diabetes mellitus, uncontrolled 01/06/2021   Acute pancreatitis 01/06/2021   Diabetic peripheral vascular disease (HCC) 01/06/2021   Enlarged prostate 01/06/2021   Gout 01/06/2021   Headache 01/06/2021   Loss of appetite 01/06/2021   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   Peripheral neuropathy 01/06/2021   Sciatica 01/06/2021   Vitamin B12 deficiency 01/06/2021   Vitamin D  deficiency 01/06/2021   Weakness 01/06/2021   Diabetes mellitus type 2 with neurological manifestations (HCC) 01/06/2021   Protein-calorie malnutrition, severe 12/26/2020   Diabetic neuropathy (HCC) 11/17/2020   Hyperglycemia due to type 2 diabetes mellitus (HCC) 11/17/2020   Long term (current) use of insulin  (HCC) 11/17/2020   Other intervertebral disc degeneration, lumbar region 03/30/2019   Coronary artery disease 01/30/2019   Tobacco abuse 01/30/2019   Hyperlipidemia 09/04/2014   Thrombocytopenia    Coronary atherosclerosis of native coronary artery 10/01/2013   Other and unspecified hyperlipidemia 10/01/2013   Essential  hypertension 10/01/2013   Type 2 diabetes mellitus with complication, without long-term current use of insulin  (HCC) 10/01/2013   Esophageal reflux 10/01/2013   Hypertrophy of prostate without urinary obstruction and other lower urinary tract symptoms (LUTS) 10/01/2013   PCP:  Charlott Dorn LABOR, MD Pharmacy:   [facility] [phone] - [place], [place] - [address] [place] [address] Phone: [phone] Fax: [phone]  [email or web address] - [place], [place] - [phone] [facility] [address] [facility] [address] [place] [address] Phone: [phone] Fax: [phone]  [facility] [address] [place] [address] Phone: [phone] Fax: [phone]  [facility] [address] [place] [address] Phone: [phone] Fax: [phone]     Social Drivers of Health (SDOH) Social History: SDOH Screenings   Food Insecurity: No Food Insecurity (10/23/2024)  Housing: Low Risk (10/23/2024)  Transportation Needs: No Transportation Needs (10/23/2024)  Utilities: Not At Risk (10/23/2024)  Alcohol Screen: Low Risk (09/24/2024)  Depression (PHQ2-9): Low Risk (04/21/2022)  Financial Resource Strain: Low Risk (09/24/2024)  Social Connections: Socially Isolated (10/23/2024)  Stress: No Stress Concern Present (06/12/2023)   Received from [facility]  Tobacco Use: High Risk (11/06/2024)   SDOH Interventions: Social Connections Interventions: Other (Comment), Inpatient TOC (Resources previously given)   Readmission Risk Interventions    09/25/2024   10:24 AM  Readmission Risk Prevention Plan  Transportation  Screening Complete  HRI or Home Care Consult Complete  Social Work Consult for Recovery Care Planning/Counseling Complete  Palliative Care Screening Not Applicable  Medication Review Oceanographer) Referral to Pharmacy

## 2024-11-08 NOTE — Progress Notes (Signed)
 "     Progress Note   Subjective  Patient feels the same, not eating.  Has undergone further evaluation with cardiology, has end-stage RV failure.   Objective   Vital signs in last 24 hours: Temp:  [97.1 F (36.2 C)-98.2 F (36.8 C)] 97.3 F (36.3 C) (02/05 0723) Pulse Rate:  [72-89] 84 (02/05 1432) Resp:  [9-25] 14 (02/05 1432) BP: (80-105)/(53-72) 87/68 (02/05 1432) SpO2:  [99 %-100 %] 100 % (02/05 1432) Last BM Date : 11/06/24 General:    white male in NAD, sleeping Psych:  Cooperative. Normal mood and affect.  Intake/Output from previous day: 02/04 0701 - 02/05 0700 In: 100 [P.O.:100] Out: 220 [Urine:220] Intake/Output this shift: No intake/output data recorded.  Lab Results: Recent Labs    11/06/24 1137 11/06/24 1145 11/07/24 0246 11/08/24 0637  WBC 5.3  --  6.3 7.4  HGB 9.7* 10.9* 10.5* 9.5*  HCT 30.7* 32.0* 34.3* 30.8*  PLT 91*  --  99* 70*   BMET Recent Labs    11/07/24 0246 11/07/24 1912 11/08/24 0637  NA 137 138 137  K 3.0* 3.1* 3.5  CL 100 99 100  CO2 21* 23 25  GLUCOSE 361* 207* 182*  BUN 42* 42* 41*  CREATININE 1.89* 1.87* 1.84*  CALCIUM  9.5 8.8* 8.9   LFT Recent Labs    11/07/24 0246  PROT 6.3*  ALBUMIN  3.4*  AST 16  ALT 17  ALKPHOS 110  BILITOT 1.1   PT/INR Recent Labs    11/06/24 1137  LABPROT 20.1*  INR 1.6*    Studies/Results: VAS US  MESENTERIC Result Date: 11/08/2024 ABDOMINAL VISCERAL Patient Name:  ZADRIAN MCCAULEY  Date of Exam:   11/08/2024 Medical Rec #: [id number]               Accession #:    [id number] Date of Birth: 1941/08/01               Patient Gender: M Patient Age:   84 years Exam Location:  [facility] Procedure:      VAS US  MESENTERIC Referring Phys: [id number] ELSIE JAYSON MONTCLAIR -------------------------------------------------------------------------------- Indications: Insufficiency mesenteric. Limitations: Air/bowel gas. Comparison Study: No prior exam. Performing Technologist: Edilia Elden Appl  Examination Guidelines: A complete evaluation includes B-mode imaging, spectral Doppler, color Doppler, and power Doppler as needed of all accessible portions of each vessel. Bilateral testing is considered an integral part of a complete examination. Limited examinations for reoccurring indications may be performed as noted.  Duplex Findings: +----------------------+--------+--------+------+---------------+ Mesenteric            PSV cm/sEDV cm/sPlaque   Comments     +----------------------+--------+--------+------+---------------+ Aorta Mid                43      14                         +----------------------+--------+--------+------+---------------+ Celiac Artery Origin    113                                 +----------------------+--------+--------+------+---------------+ Celiac Artery Proximal  116      21                         +----------------------+--------+--------+------+---------------+ SMA Origin              146  18                         +----------------------+--------+--------+------+---------------+ SMA Proximal            233      27                         +----------------------+--------+--------+------+---------------+ SMA Mid                 127      22                         +----------------------+--------+--------+------+---------------+ SMA Distal               91      18                         +----------------------+--------+--------+------+---------------+ CHA                                         Not visualized. +----------------------+--------+--------+------+---------------+ Splenic                 104      38                         +----------------------+--------+--------+------+---------------+ IMA                      62      16                         +----------------------+--------+--------+------+---------------+  Mesenteric Technologist observations: Patent SMV. Free fluid noted in the right  upper quadrant    Summary: Mesenteric: Normal Celiac artery , Superior Mesenteric artery, Inferior Mesenteric artery and Splenic artery findings. Patent SMV.  *See table(s) above for measurements and observations.  Diagnosing physician: Norman Serve  Electronically signed by Norman Serve on 11/08/2024 at 1:44:08 PM.    Final    DG CHEST PORT 1 VIEW Result Date: 11/07/2024 EXAM: 1 VIEW XRAY OF THE CHEST 11/07/2024 10:43:00 PM COMPARISON: 11/07/2024 CLINICAL HISTORY: Status post PICC central line placement. FINDINGS: LINES, TUBES AND DEVICES: Right-sided PICC line in place with distal tip at superior cavoatrial junction. LUNGS AND PLEURA: Diffuse interstitial opacities. Right basilar opacity. Small right pleural effusion. No pneumothorax. HEART AND MEDIASTINUM: Aortic atherosclerosis. No acute abnormality of the cardiac silhouette. BONES AND SOFT TISSUES: No acute osseous abnormality. IMPRESSION: 1. Right-sided PICC line with distal tip at the superior cavoatrial junction. 2. Similar right basilar airspace opacities and small right pleural effusion. Electronically signed by: Norman Gatlin MD 11/07/2024 10:51 PM EST RP Workstation: [id number]   US  EKG SITE RITE Result Date: 11/07/2024 If Site Rite image not attached, placement could not be confirmed due to current cardiac rhythm.  ECHOCARDIOGRAM COMPLETE Result Date: 11/07/2024    ECHOCARDIOGRAM REPORT   Patient Name:   FAYETTE HAMADA Date of Exam: 11/07/2024 Medical Rec #:  [id number]              Height:       69.0 in Accession #:    [id number]             Weight:  150.1 lb Date of Birth:  [birth year]              BSA:          1.829 m Patient Age:    83 years               BP:           102/81 mmHg Patient Gender: M                      HR:           107 bpm. Exam Location:  Inpatient Procedure: 2D Echo, Cardiac Doppler, Color Doppler and Intracardiac            Opacification Agent (Both Spectral and Color Flow Doppler were            utilized during  procedure). Indications:    R07.9* Chest pain, unspecified. Elevated troponin  History:        Patient has prior history of Echocardiogram examinations. CHF,                 CAD, Abnormal ECG, Arrythmias:Atrial Fibrillation; Risk                 Factors:Hypertension, Dyslipidemia, Current Smoker and Diabetes.  Sonographer:    Ellouise Mose RDCS Referring Phys: [id number] ANASTASSIA DOUTOVA IMPRESSIONS  1. Left ventricular ejection fraction, by estimation, is <20%. The left ventricle has severely decreased function. The left ventricle demonstrates regional wall motion abnormalities (see scoring diagram/findings for description). There is mild left ventricular hypertrophy. Left ventricular diastolic parameters are indeterminate.  2. Echo enhancing images with defect in the RV consistent with possible RV thrombus. . Right ventricular systolic function is severely reduced. The right ventricular size is moderately enlarged.  3. The mitral valve is degenerative. Moderate to severe mitral valve regurgitation. Mild mitral stenosis.  4. The aortic valve is calcified. Aortic valve regurgitation is trivial. Moderate aortic valve stenosis. FINDINGS  Left Ventricle: Left ventricular ejection fraction, by estimation, is <20%. The left ventricle has severely decreased function. The left ventricle demonstrates regional wall motion abnormalities. Definity  contrast agent was given IV to delineate the left ventricular endocardial borders. There is mild left ventricular hypertrophy. Left ventricular diastolic parameters are indeterminate. Right Ventricle: Echo enhancing images with defect in the RV consistent with possible RV thrombus. The right ventricular size is moderately enlarged. No increase in right ventricular wall thickness. Right ventricular systolic function is severely reduced. Left Atrium: Left atrial size was normal in size. Right Atrium: Right atrial size was normal in size. Pericardium: There is no evidence of pericardial  effusion. Mitral Valve: The mitral valve is degenerative in appearance. Moderate to severe mitral valve regurgitation. Mild mitral valve stenosis. MV peak gradient, 5.8 mmHg. The mean mitral valve gradient is 2.0 mmHg. Tricuspid Valve: The tricuspid valve is grossly normal. Tricuspid valve regurgitation is mild. Aortic Valve: The aortic valve is calcified. Aortic valve regurgitation is trivial. Moderate aortic stenosis is present. Aortic valve mean gradient measures 7.5 mmHg. Aortic valve peak gradient measures 11.2 mmHg. Aortic valve area, by VTI measures 1.35 cm. Pulmonic Valve: The pulmonic valve was normal in structure. Pulmonic valve regurgitation is trivial. No evidence of pulmonic stenosis. Aorta: The aortic root and ascending aorta are structurally normal, with no evidence of dilitation. IAS/Shunts: No atrial level shunt detected by color flow Doppler.  LEFT VENTRICLE PLAX 2D LVIDd:         4.90 cm LVIDs:  4.70 cm LV PW:         1.30 cm LV IVS:        1.30 cm LVOT diam:     2.13 cm LV SV:         33 LV SV Index:   18 LVOT Area:     3.56 cm  LV Volumes (MOD) LV vol d, MOD A2C: 139.0 ml LV vol d, MOD A4C: 157.0 ml LV vol s, MOD A2C: 120.0 ml LV vol s, MOD A4C: 121.0 ml LV SV MOD A2C:     19.0 ml LV SV MOD A4C:     157.0 ml LV SV MOD BP:      26.7 ml RIGHT VENTRICLE            IVC RV S prime:     5.55 cm/s  IVC diam: 2.58 cm TAPSE (M-mode): 0.6 cm LEFT ATRIUM             Index        RIGHT ATRIUM           Index LA diam:        3.72 cm 2.03 cm/m   RA Area:     14.00 cm LA Vol (A2C):   50.7 ml 27.72 ml/m  RA Volume:   30.80 ml  16.84 ml/m LA Vol (A4C):   64.5 ml 35.27 ml/m LA Biplane Vol: 57.1 ml 31.22 ml/m  AORTIC VALVE                     PULMONIC VALVE AV Area (Vmax):    1.35 cm      PR End Diast Vel: 0.96 msec AV Area (Vmean):   1.27 cm AV Area (VTI):     1.35 cm AV Vmax:           167.20 cm/s AV Vmean:          108.520 cm/s AV VTI:            0.246 m AV Peak Grad:      11.2 mmHg AV Mean  Grad:      7.5 mmHg LVOT Vmax:         63.40 cm/s LVOT Vmean:        38.800 cm/s LVOT VTI:          0.094 m LVOT/AV VTI ratio: 0.38  AORTA Ao Root diam: 2.98 cm Ao Asc diam:  3.86 cm MITRAL VALVE                  TRICUSPID VALVE MV Area (PHT): 6.93 cm       TR Peak grad:   26.8 mmHg MV Area VTI:   0.87 cm       TR Vmax:        259.00 cm/s MV Peak grad:  5.8 mmHg MV Mean grad:  2.0 mmHg       SHUNTS MV Vmax:       1.20 m/s       Systemic VTI:  0.09 m MV Vmean:      72.1 cm/s      Systemic Diam: 2.13 cm MV Decel Time: 110 msec MR Peak grad:    74.0 mmHg MR Mean grad:    46.0 mmHg MR Vmax:         430.00 cm/s MR Vmean:        314.0 cm/s MR PISA:         2.32 cm MR  PISA Eff ROA: 20 mm MR PISA Radius:  0.61 cm MV E velocity: 144.50 cm/s Kardie Tobb DO Electronically signed by Dub Huntsman DO Signature Date/Time: 11/07/2024/1:16:40 PM    Final    IR THORACENTESIS RIGHT ASP PLEURAL SPACE W/IMG GUIDE Result Date: 11/07/2024 INDICATION: 84 year old male who is COVID+ presents with right pleural effusion. Received request for diagnostic and therapeutic thoracentesis. This is patient's first thoracentesis. EXAM: ULTRASOUND GUIDED DIAGNOSTIC AND THERAPEUTIC, RIGHT-SIDED THORACENTESIS MEDICATIONS: 8 mL 1% lidocaine  with epinephrine  COMPLICATIONS: None immediate. PROCEDURE: An ultrasound guided thoracentesis was thoroughly discussed with the patient and questions answered. The benefits, risks, alternatives and complications were also discussed. The patient understands and wishes to proceed with the procedure. Written consent was obtained. Ultrasound was performed to localize and mark an adequate pocket of fluid in the right chest. The area was then prepped and draped in the normal sterile fashion. 1% lidocaine  with epinephrine  was used for local anesthesia. Under ultrasound guidance a 6 Fr Safe-T-Centesis catheter was introduced. Thoracentesis was performed. The catheter was removed and a dressing applied. FINDINGS: A total of  approximately 1 liter of clear yellow fluid was removed. Samples were sent to the laboratory as requested by the clinical team. IMPRESSION: Successful ultrasound guided right thoracentesis yielding 1 liter of pleural fluid. Performed by: Kristi Davenport, NP under the supervision of Dr. Juliene Balder Electronically Signed   By: Juliene Balder M.D.   On: 11/07/2024 10:41   DG Chest Port 1 View Result Date: 11/07/2024 EXAM: 1 VIEW XRAY OF THE CHEST 11/07/2024 09:22:00 AM COMPARISON: 11/06/2024 CLINICAL HISTORY: 84 year old male. Status post thoracentesis. 1 liter of right side pleural fluid removed this morning. FINDINGS: LUNGS AND PLEURA: Improved aeration of right lung base with persistent moderate basilar opacities. No pneumothorax. Incidental skin fold artifact in the mid left chest. HEART AND MEDIASTINUM: Aortic atherosclerosis. No acute abnormality of the cardiac and mediastinal silhouettes. BONES AND SOFT TISSUES: Surgical anchors in right humeral head. No acute osseous abnormality. IMPRESSION: 1. Improved right lung ventilation and no pneumothorax following thoracentesis. Electronically signed by: Helayne Hurst MD 11/07/2024 09:34 AM EST RP Workstation: [id number]       Assessment / Plan:    84 year old male here with the following:  Postprandial abdominal pain / decreased p.o. intake Pneumonia CHF - end stage RV failure  See yesterday's note for full details.  He has been on high-dose PPI without benefit of his symptoms which have progressed.  He has significant vascular disease, mesenteric ischemia certainly on the DDx for his symptoms.  Ideally CTA is that test of choice to evaluate for that however given his CKD, at risk for worsening renal function.  Mesenteric Doppler obtained which did not show an obvious lesion, however it does not necessarily rule it out.  He is in a very difficult situation.  Cardiology notes reviewed, he has end-stage RV failure, cardiogenic shock, which appears terminal.   This could also be causing his GI upset, although a bit atypical for his abdominal pain.  Discussed with the family that I do not think it is worthwhile to pursue CTA, given risk for worsening renal functioning and he may not be a candidate for intervention if something was found.  Further, do not recommend an EGD given his RV failure, high risk for sedation and do not want to precipitate deterioration of his cardiopulmonary status.  His prognosis is extremely poor per cardiology, he plans on going home. Would give him standing Zofran  or Phenergan  as needed  for his nausea, continue PPI twice daily, add Reglan  as needed.  Discussed at length with family, they understand, all questions answered.  They agree with plan.  We will sign off for now, call with questions in the interim.  Marcey Naval, MD [facility] "

## 2024-11-08 NOTE — Progress Notes (Addendum)
 " PROGRESS NOTE    Keith Espinoza  FMW:[id number] DOB: 1941/09/25 DOA: 11/06/2024 PCP: Charlott Dorn LABOR, MD   Brief Narrative:  Keith Espinoza is a 84 y.o. 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    Patient presents with complaints of nausea vomiting abdominal pain with poor p.o. intake over the past few weeks - worsening over the past few days.  Reports losing weight over the past month due to poor p.o. intake and is now wheelchair-bound -denies dysphagia or odynophagia just poor appetite.  Imaging at intake noted large right lower lobe layering effusion.  Assessment & Plan:   Principal Problem:   Acute on chronic congestive heart failure (HCC) Active Problems:   Loss of appetite   Hyperlipidemia   Type 2 diabetes mellitus with hyperglycemia, with long-term current use of insulin  (HCC)   Chronic heart failure with mildly reduced ejection fraction (HFmrEF, 41-49%) (HCC)   Dysphagia   Coronary artery disease   Paroxysmal A-fib (HCC)   Aspiration pneumonia (HCC)   Moderate sized pleural effusion   Prolonged QT interval   Pleural effusion   Loss of weight   Chronic kidney disease   Cardiogenic shock (HCC)   Thrombus in heart chamber  Goals of care - Lengthy discussion today with patient wife and friend at bedside - Patient initially indicated that he just wants to go home  - We discussed that given his current reliance on dobutamine  he would likely not tolerate transport home at this time. - In regards to his symptoms we continue to evaluate for possible mesenteric ischemia and he is agreeable for further imaging and intervention if appropriate although unclear if aggressive care  is warranted given below - Palliative care consulted - follow-up for ongoing goals of care as well as planning for possible disposition given his tenuous state  Chronic heart failure with newly reduced EF from prior Cardiogenic shock with ischemic cardiomyopathy, POA - Echo shows new profoundly reduced EF (<20%) with questionable RV thrombus, severe MR and mod-severe AS.  - Cardiology/HF following -appreciate insight/recs - PICC placed and dobutamine  drip added on overnight - unfortunately appears patient is currently at end stage of the disease process - very little to offer from a cardiac standpoint  Severe sepsis secondary to aspiration pneumonia, right  Concurrent large right pleural effusion, questionably parapneumonic Acute hypoxic respiratory failure - Severe sepsis criteria met with tachypnea, tachycardia notable source, noted lactic acidosis - Continue azithromycin , ceftriaxone  -plan for 5-day course - Supportive care ongoing - IR consulted -status post thoracentesis 2/4, right, 1 L withdrawn - High likelihood patient's pneumonia is aspiration and fluid is parapneumonic - Cultures pending, no growth to date - Strep pneumo UA negative - Continue to wean oxygen as appropriate currently on 2 L nasal cannula(baseline on room air)             Intractable abdominal pain - post prandial Moderate to severe protein caloric malnutrition - Prior upper endoscopy 2024 for hematemesis showed small bleed but otherwise unremarkable - Discussed case with GI - will start with mesenteric US  to evaluate for ischemia - if inconclusive patient and family are agreeable for CT with contrast to evaluate. they understand the risks of contrast in the setting of patient's renal function -  If notable mesenteric ischemia would contact vascular for possible intervention - Continue supplementation, dietary to follow - SLP following, advance to full liquid diet - still unable to tolerate PO due to profound  abdominal pain upon eating -DC Zofran  given no improvement and schedule Phenergan  in the interim  Chronic anemia of chronic disease -likely exacerbated by malnutrition as above, fecal occult negative 11/06/24  Hyperlipidemia- Continue Crestor  10 mg a day  Type 2 diabetes mellitus uncontrolled with hyperglycemia, with long-term current use of insulin  (HCC) -Continue sliding scale insulin , hypoglycemic protocol, A1c 8.7    Coronary artery disease Stable continue Crestor  10 mg a day -noncompliant with Plavix  Paroxysmal A-fib (HCC) Not on anticoagulation Prolonged QT interval - History of, continue to follow  DVT prophylaxis: SCDs Start: 11/06/24 2113 Code Status:   Code Status: Limited: Do not attempt resuscitation (DNR) -DNR-LIMITED -Do Not Intubate/DNI  Family Communication: Daughter at bedside  Status is: Inpatient  Dispo: The patient is from: Home              Anticipated d/c is to: To be determined              Anticipated d/c date is: 48 to 72 hours              Patient currently not medically stable for discharge  Consultants:  GI, cardiology/heart failure, palliative care  Procedures:  None  Antimicrobials:  None indicated  Subjective: No acute issues or events overnight -continues to have prolific abdominal pain with even clear liquids.  Objective: Vitals:   11/08/24 0032 11/08/24 0333 11/08/24 0337 11/08/24 0350  BP: 97/67 (!) 84/63 (!) 85/67 (!) 88/64  Pulse:  89    Resp:  (!) 9    Temp:  98.2 F (36.8 C)    TempSrc:  Oral    SpO2:      Weight:      Height:        Intake/Output Summary (Last 24 hours) at 11/08/2024 0722 Last data filed at 11/08/2024 0351 Gross per 24 hour  Intake 100 ml  Output 220 ml  Net -120 ml   Filed Weights   11/06/24 1034 11/06/24 1823  Weight: 62.6 kg 68.1 kg    Examination:  General: Cachectic appearing gentleman, pleasantly resting in bed, No acute distress. HEENT:  Normocephalic atraumatic.  Sclerae nonicteric,  noninjected.  Extraocular movements intact bilaterally. Neck:  Without mass or deformity.  Trachea is midline. Lungs:  Clear to auscultate bilaterally without rhonchi, wheeze, or rales. Heart:  Regular rate and rhythm.  Without murmurs, rubs, or gallops. Abdomen:  Soft, tender at the midepigastrium, nondistended.  Without guarding or rebound. Extremities: Without cyanosis, clubbing, edema, or obvious deformity. Skin:  Warm and dry, no erythema.  Data Reviewed: I have personally reviewed following labs and imaging studies  CBC: Recent Labs  Lab 11/06/24 1137 11/06/24 1145 11/07/24 0246  WBC 5.3  --  6.3  NEUTROABS 4.2  --   --   HGB 9.7* 10.9* 10.5*  HCT 30.7* 32.0* 34.3*  MCV 93.3  --  97.4  PLT 91*  --  99*   Basic Metabolic Panel: Recent Labs  Lab 11/06/24 1137 11/06/24 1145 11/06/24 2105 11/07/24 0246 11/07/24 1912 11/08/24 0637  NA 137 136 137 137 138 137  K 3.3* 3.7 3.1* 3.0* 3.1* 3.5  CL 97*  --  97* 100 99 100  CO2 23  --  25 21* 23 25  GLUCOSE 464*  --  438* 361*  207* 182*  BUN 39*  --  43* 42* 42* 41*  CREATININE 1.97*  --  1.91* 1.89* 1.87* 1.84*  CALCIUM  9.9  --  9.3 9.5 8.8* 8.9  MG  --   --  1.6* 1.8  --   --   PHOS  --   --  2.8 2.5  --   --    GFR: Estimated Creatinine Clearance: 28.8 mL/min (A) (by C-G formula based on SCr of 1.84 mg/dL (H)). Liver Function Tests: Recent Labs  Lab 11/06/24 1137 11/07/24 0246  AST 21 16  ALT 19 17  ALKPHOS 117 110  BILITOT 1.3* 1.1  PROT 6.4* 6.3*  ALBUMIN  3.5 3.4*   Recent Labs  Lab 11/06/24 1137  LIPASE 23   Recent Labs  Lab 11/06/24 2105  AMMONIA <13   Coagulation Profile: Recent Labs  Lab 11/06/24 1137  INR 1.6*   BNP (last 3 results) Recent Labs    10/05/24 1620 10/25/24 1012 11/06/24 1137  PROBNP 27,900.0* 24,629.0* >35,000.0*   HbA1C: Recent Labs    11/07/24 0246  HGBA1C 8.7*   CBG: Recent Labs  Lab 11/07/24 1655 11/07/24 2005 11/07/24 2212 11/08/24 0105 11/08/24 0331   GLUCAP 108* 174* 177* 159* 162*   Lipid Profile: No results for input(s): CHOL, HDL, LDLCALC, TRIG, CHOLHDL, LDLDIRECT in the last 72 hours. Thyroid  Function Tests: No results for input(s): TSH, T4TOTAL, FREET4, T3FREE, THYROIDAB in the last 72 hours. Anemia Panel: Recent Labs    11/06/24 2105 11/07/24 0246  VITAMINB12  --  >4,000*  FOLATE  --  5.5*  FERRITIN 89  --   TIBC 311  --   IRON  25*  --   RETICCTPCT 2.8  --    Sepsis Labs: Recent Labs  Lab 11/06/24 2105 11/07/24 0019 11/07/24 0246 11/07/24 0811 11/07/24 1912 11/07/24 2130  PROCALCITON 0.25  --   --   --   --   --   LATICACIDVEN 3.3*   < > 4.9* 4.7* 2.3* 2.3*   < > = values in this interval not displayed.    Recent Results (from the past 240 hours)  Blood Culture (routine x 2)     Status: None (Preliminary result)   Collection Time: 11/06/24 11:35 AM   Specimen: BLOOD RIGHT FOREARM  Result Value Ref Range Status   Specimen Description   Final    BLOOD RIGHT FOREARM Performed at [facility], [address] [place], [place] [address]    Special Requests   Final    Blood Culture adequate volume BOTTLES DRAWN AEROBIC AND ANAEROBIC Performed at [facility], [address], [place], [place] [address]    Culture   Final    NO GROWTH 2 DAYS Performed at [facility], [address], [place], [place] [address]    Report Status PENDING  Incomplete  Blood Culture (routine x 2)     Status: None (Preliminary result)   Collection Time: 11/06/24 12:19 PM   Specimen: BLOOD  Result Value Ref Range Status   Specimen Description   Final    BLOOD RIGHT ANTECUBITAL Performed at [facility], [address], [place], [place] [address]    Special Requests   Final    Blood Culture results may not be optimal due to an inadequate volume of blood received in culture bottles BOTTLES DRAWN AEROBIC AND ANAEROBIC Performed at [facility], [address] [place], [place] [address]    Culture  Final    NO GROWTH 2 DAYS Performed at [facility], [address], [place], [place] [address]    Report Status PENDING  Incomplete  Body fluid culture w Gram Stain     Status: None (Preliminary result)   Collection Time: 11/07/24  9:17 AM   Specimen: Lung, Right; Pleural Fluid  Result Value Ref Range Status   Specimen Description PLEURAL  Final   Special Requests LUNG,RIGHT  Final   Gram Stain   Final    NO WBC SEEN NO ORGANISMS SEEN Performed at [facility], [address] [place], [place] [address]    Culture PENDING  Incomplete   Report Status PENDING  Incomplete         Radiology Studies: DG CHEST PORT 1 VIEW Result Date: 11/07/2024 EXAM: 1 VIEW XRAY OF THE CHEST 11/07/2024 10:43:00 PM COMPARISON: 11/07/2024 CLINICAL HISTORY: Status post PICC central line placement. FINDINGS: LINES, TUBES AND DEVICES: Right-sided PICC line in place with distal tip at superior cavoatrial junction. LUNGS AND PLEURA: Diffuse interstitial opacities. Right basilar opacity. Small right pleural effusion. No pneumothorax. HEART AND MEDIASTINUM: Aortic atherosclerosis. No acute abnormality of the cardiac silhouette. BONES AND SOFT TISSUES: No acute osseous abnormality. IMPRESSION: 1. Right-sided PICC line with distal tip at the superior cavoatrial junction. 2. Similar right basilar airspace opacities and small right pleural effusion. Electronically signed by: Norman Gatlin MD 11/07/2024 10:51 PM EST RP Workstation: [id number]   US  EKG SITE RITE Result Date: 11/07/2024 If Site Rite image not attached, placement could not be confirmed due to current cardiac rhythm.  ECHOCARDIOGRAM COMPLETE Result Date: 11/07/2024    ECHOCARDIOGRAM REPORT   Patient Name:   Keith Espinoza Date of Exam: 11/07/2024 Medical Rec #:  [id number]              Height:       69.0 in Accession #:    [id number]             Weight:        150.1 lb Date of Birth:  [birth year]              BSA:          1.829 m Patient Age:    83 years               BP:           102/81 mmHg Patient Gender: M                      HR:           107 bpm. Exam Location:  Inpatient Procedure: 2D Echo, Cardiac Doppler, Color Doppler and Intracardiac            Opacification Agent (Both Spectral and Color Flow Doppler were            utilized during procedure). Indications:    R07.9* Chest pain, unspecified. Elevated troponin  History:        Patient has prior history of Echocardiogram examinations. CHF,                 CAD, Abnormal ECG, Arrythmias:Atrial Fibrillation; Risk                 Factors:Hypertension, Dyslipidemia, Current Smoker and Diabetes.  Sonographer:    Ellouise Mose RDCS Referring Phys: [id number] ANASTASSIA DOUTOVA IMPRESSIONS  1. Left ventricular ejection fraction, by estimation, is <20%. The left ventricle has  severely decreased function. The left ventricle demonstrates regional wall motion abnormalities (see scoring diagram/findings for description). There is mild left ventricular hypertrophy. Left ventricular diastolic parameters are indeterminate.  2. Echo enhancing images with defect in the RV consistent with possible RV thrombus. . Right ventricular systolic function is severely reduced. The right ventricular size is moderately enlarged.  3. The mitral valve is degenerative. Moderate to severe mitral valve regurgitation. Mild mitral stenosis.  4. The aortic valve is calcified. Aortic valve regurgitation is trivial. Moderate aortic valve stenosis. FINDINGS  Left Ventricle: Left ventricular ejection fraction, by estimation, is <20%. The left ventricle has severely decreased function. The left ventricle demonstrates regional wall motion abnormalities. Definity  contrast agent was given IV to delineate the left ventricular endocardial borders. There is mild left ventricular hypertrophy. Left ventricular diastolic parameters are indeterminate. Right Ventricle: Echo  enhancing images with defect in the RV consistent with possible RV thrombus. The right ventricular size is moderately enlarged. No increase in right ventricular wall thickness. Right ventricular systolic function is severely reduced. Left Atrium: Left atrial size was normal in size. Right Atrium: Right atrial size was normal in size. Pericardium: There is no evidence of pericardial effusion. Mitral Valve: The mitral valve is degenerative in appearance. Moderate to severe mitral valve regurgitation. Mild mitral valve stenosis. MV peak gradient, 5.8 mmHg. The mean mitral valve gradient is 2.0 mmHg. Tricuspid Valve: The tricuspid valve is grossly normal. Tricuspid valve regurgitation is mild. Aortic Valve: The aortic valve is calcified. Aortic valve regurgitation is trivial. Moderate aortic stenosis is present. Aortic valve mean gradient measures 7.5 mmHg. Aortic valve peak gradient measures 11.2 mmHg. Aortic valve area, by VTI measures 1.35 cm. Pulmonic Valve: The pulmonic valve was normal in structure. Pulmonic valve regurgitation is trivial. No evidence of pulmonic stenosis. Aorta: The aortic root and ascending aorta are structurally normal, with no evidence of dilitation. IAS/Shunts: No atrial level shunt detected by color flow Doppler.  LEFT VENTRICLE PLAX 2D LVIDd:         4.90 cm LVIDs:         4.70 cm LV PW:         1.30 cm LV IVS:        1.30 cm LVOT diam:     2.13 cm LV SV:         33 LV SV Index:   18 LVOT Area:     3.56 cm  LV Volumes (MOD) LV vol d, MOD A2C: 139.0 ml LV vol d, MOD A4C: 157.0 ml LV vol s, MOD A2C: 120.0 ml LV vol s, MOD A4C: 121.0 ml LV SV MOD A2C:     19.0 ml LV SV MOD A4C:     157.0 ml LV SV MOD BP:      26.7 ml RIGHT VENTRICLE            IVC RV S prime:     5.55 cm/s  IVC diam: 2.58 cm TAPSE (M-mode): 0.6 cm LEFT ATRIUM             Index        RIGHT ATRIUM           Index LA diam:        3.72 cm 2.03 cm/m   RA Area:     14.00 cm LA Vol (A2C):   50.7 ml 27.72 ml/m  RA Volume:    30.80 ml  16.84 ml/m LA Vol (A4C):   64.5 ml 35.27 ml/m LA Biplane  Vol: 57.1 ml 31.22 ml/m  AORTIC VALVE                     PULMONIC VALVE AV Area (Vmax):    1.35 cm      PR End Diast Vel: 0.96 msec AV Area (Vmean):   1.27 cm AV Area (VTI):     1.35 cm AV Vmax:           167.20 cm/s AV Vmean:          108.520 cm/s AV VTI:            0.246 m AV Peak Grad:      11.2 mmHg AV Mean Grad:      7.5 mmHg LVOT Vmax:         63.40 cm/s LVOT Vmean:        38.800 cm/s LVOT VTI:          0.094 m LVOT/AV VTI ratio: 0.38  AORTA Ao Root diam: 2.98 cm Ao Asc diam:  3.86 cm MITRAL VALVE                  TRICUSPID VALVE MV Area (PHT): 6.93 cm       TR Peak grad:   26.8 mmHg MV Area VTI:   0.87 cm       TR Vmax:        259.00 cm/s MV Peak grad:  5.8 mmHg MV Mean grad:  2.0 mmHg       SHUNTS MV Vmax:       1.20 m/s       Systemic VTI:  0.09 m MV Vmean:      72.1 cm/s      Systemic Diam: 2.13 cm MV Decel Time: 110 msec MR Peak grad:    74.0 mmHg MR Mean grad:    46.0 mmHg MR Vmax:         430.00 cm/s MR Vmean:        314.0 cm/s MR PISA:         2.32 cm MR PISA Eff ROA: 20 mm MR PISA Radius:  0.61 cm MV E velocity: 144.50 cm/s Kardie Tobb DO Electronically signed by Dub Huntsman DO Signature Date/Time: 11/07/2024/1:16:40 PM    Final    IR THORACENTESIS RIGHT ASP PLEURAL SPACE W/IMG GUIDE Result Date: 11/07/2024 INDICATION: 84 year old male who is COVID+ presents with right pleural effusion. Received request for diagnostic and therapeutic thoracentesis. This is patient's first thoracentesis. EXAM: ULTRASOUND GUIDED DIAGNOSTIC AND THERAPEUTIC, RIGHT-SIDED THORACENTESIS MEDICATIONS: 8 mL 1% lidocaine  with epinephrine  COMPLICATIONS: None immediate. PROCEDURE: An ultrasound guided thoracentesis was thoroughly discussed with the patient and questions answered. The benefits, risks, alternatives and complications were also discussed. The patient understands and wishes to proceed with the procedure. Written consent was obtained.  Ultrasound was performed to localize and mark an adequate pocket of fluid in the right chest. The area was then prepped and draped in the normal sterile fashion. 1% lidocaine  with epinephrine  was used for local anesthesia. Under ultrasound guidance a 6 Fr Safe-T-Centesis catheter was introduced. Thoracentesis was performed. The catheter was removed and a dressing applied. FINDINGS: A total of approximately 1 liter of clear yellow fluid was removed. Samples were sent to the laboratory as requested by the clinical team. IMPRESSION: Successful ultrasound guided right thoracentesis yielding 1 liter of pleural fluid. Performed by: Kristi Davenport, NP under the supervision of Dr. Juliene Balder Electronically Signed   By: Juliene  Philip M.D.   On: 11/07/2024 10:41   DG Chest Port 1 View Result Date: 11/07/2024 EXAM: 1 VIEW XRAY OF THE CHEST 11/07/2024 09:22:00 AM COMPARISON: 11/06/2024 CLINICAL HISTORY: 84 year old male. Status post thoracentesis. 1 liter of right side pleural fluid removed this morning. FINDINGS: LUNGS AND PLEURA: Improved aeration of right lung base with persistent moderate basilar opacities. No pneumothorax. Incidental skin fold artifact in the mid left chest. HEART AND MEDIASTINUM: Aortic atherosclerosis. No acute abnormality of the cardiac and mediastinal silhouettes. BONES AND SOFT TISSUES: Surgical anchors in right humeral head. No acute osseous abnormality. IMPRESSION: 1. Improved right lung ventilation and no pneumothorax following thoracentesis. Electronically signed by: Helayne Hurst MD 11/07/2024 09:34 AM EST RP Workstation: [id number]   CT Chest Wo Contrast Result Date: 11/06/2024 EXAM: CT CHEST WITHOUT CONTRAST 11/06/2024 11:54:23 AM TECHNIQUE: CT of the chest was performed without the administration of intravenous contrast. Multiplanar reformatted images are provided for review. Automated exposure control, iterative reconstruction, and/or weight based adjustment of the mA/kV was utilized to  reduce the radiation dose to as low as reasonably achievable. COMPARISON: None available. CLINICAL HISTORY: Pulm edema, PNA? Pulmonary edema; query pneumonia. FINDINGS: MEDIASTINUM: Heart and pericardium are unremarkable. Coronary artery calcifications noted. No pericardial effusion. The central airways are clear. LYMPH NODES: No mediastinal, hilar or axillary lymphadenopathy. LUNGS AND PLEURA: Moderate large layering right pleural effusion. There is atelectasis and ground glass density in the right lower lobe suggesting potential aspiration or pneumonia superimposed on the atelectasis. No pneumothorax. SOFT TISSUES/BONES: No acute abnormality of the bones or soft tissues. No fracture. UPPER ABDOMEN: Limited images of the upper abdomen demonstrate mild ascites. 2 cm benign adenoma of the left adrenal gland. IMPRESSION: 1. Moderate to large layering right pleural effusion. 2. Right lower lobe atelectasis with ground-glass opacity, which may represent aspiration or pneumonia superimposed on atelectasis. Electronically signed by: Norleen Boxer MD 11/06/2024 12:08 PM EST RP Workstation: [id number]   CT ABDOMEN PELVIS WO CONTRAST Result Date: 11/06/2024 EXAM: CT ABDOMEN AND PELVIS WITHOUT CONTRAST 11/06/2024 11:54:23 AM TECHNIQUE: CT of the abdomen and pelvis was performed without the administration of intravenous contrast. Multiplanar reformatted images are provided for review. Automated exposure control, iterative reconstruction, and/or weight-based adjustment of the mA/kV was utilized to reduce the radiation dose to as low as reasonably achievable. COMPARISON: None available. CLINICAL HISTORY: Abdominal pain, acute, nonlocalized. Acute, nonlocalized abdominal pain. FINDINGS: LOWER CHEST: Moderate to large right pleural effusion with right lobe atelectasis versus infiltrate. Might exclude right lower lobe infection. LIVER: The liver is unremarkable. GALLBLADDER AND BILE DUCTS: Cholecystectomy. No biliary ductal  dilatation. SPLEEN: No acute abnormality. PANCREAS: No acute abnormality. ADRENAL GLANDS: No acute abnormality. KIDNEYS, URETERS AND BLADDER: No stones in the kidneys or ureters. No hydronephrosis. No perinephric or periureteral stranding. Ureters and bladder are normal. GI AND BOWEL: Stomach demonstrates no acute abnormality. There is no bowel obstruction. PERITONEUM AND RETROPERITONEUM: Moderate volume of free fluid in the abdomen and pelvis. No free air. VASCULATURE: Vascular calcifications of the abdominal aorta. LYMPH NODES: No lymphadenopathy. REPRODUCTIVE ORGANS: No acute abnormality. BONES AND SOFT TISSUES: Bilateral nonobstructing renal calculi. Right hip prosthetic. No acute osseous abnormality. IMPRESSION: 1. Moderate to large right pleural effusion with right lobe atelectasis versus infiltrate. 2. Moderate volume of free fluid in the abdomen and pelvis. 3. No evidence of bowel obstruction. Electronically signed by: Norleen Boxer MD 11/06/2024 12:04 PM EST RP Workstation: [id number]   DG Chest Port 1 View Result Date: 11/06/2024 CLINICAL DATA:  Questionable sepsis.  EXAM: PORTABLE CHEST 1 VIEW COMPARISON:  October 05, 2024 FINDINGS: The cardiac silhouette is mildly enlarged and unchanged in size. A coronary artery stent is in place. There is marked severity calcification of the aortic arch. Low lung volumes are noted. Diffusely increased interstitial lung markings are seen with marked severity atelectasis and/or infiltrate noted within the right lung base. A moderate size right-sided pleural effusion is also present. No pneumothorax is identified. Multilevel degenerative changes seen throughout the thoracic spine. IMPRESSION: 1. Stable cardiomegaly with mild interstitial edema. 2. Marked severity right basilar atelectasis and/or infiltrate. 3. Moderate sized right-sided pleural effusion. Electronically Signed   By: Suzen Dials M.D.   On: 11/06/2024 11:26        Scheduled Meds:  azithromycin    500 mg Oral Daily   Chlorhexidine  Gluconate Cloth  6 each Topical Daily   insulin  aspart  0-9 Units Subcutaneous Q4H   insulin  glargine-yfgn  5 Units Subcutaneous QHS   lidocaine -EPINEPHrine   20 mL Intradermal Once   pantoprazole  (PROTONIX ) IV  40 mg Intravenous Q12H   sodium chloride  flush  10-40 mL Intracatheter Q12H   sodium chloride  flush  3 mL Intravenous Q12H   Continuous Infusions:  amiodarone      cefTRIAXone  (ROCEPHIN )  IV 2 g (11/07/24 1151)   DOBUTamine  2.5 mcg/kg/min (11/07/24 2225)   heparin  1,050 Units/hr (11/07/24 2302)     LOS: 2 days   Time spent:  Elsie JAYSON [place], DO [facility]  If 7PM-7AM, please contact night-coverage [email or web address]  11/08/2024, 7:22 AM      "

## 2024-11-08 NOTE — Progress Notes (Signed)
 PHARMACY - ANTICOAGULATION CONSULT NOTE  Pharmacy Consult for heparin  Indication: possible RV thrombus   Allergies[1]  Patient Measurements: Height: 5' 9 (175.3 cm) Weight: 68.1 kg (150 lb 2.1 oz) IBW/kg (Calculated) : 70.7 HEPARIN  DW (KG): 68.1  Vital Signs: Temp: 98.2 F (36.8 C) (02/05 0333) Temp Source: Oral (02/05 0333) BP: 88/64 (02/05 0350) Pulse Rate: 89 (02/05 0333)  Labs: Recent Labs    11/06/24 1137 11/06/24 1145 11/06/24 2105 11/07/24 0246 11/07/24 1912  HGB 9.7* 10.9*  --  10.5*  --   HCT 30.7* 32.0*  --  34.3*  --   PLT 91*  --   --  99*  --   LABPROT 20.1*  --   --   --   --   INR 1.6*  --   --   --   --   CREATININE 1.97*  --  1.91* 1.89* 1.87*    Estimated Creatinine Clearance: 28.3 mL/min (A) (by C-G formula based on SCr of 1.87 mg/dL (H)).   Medical History: Past Medical History:  Diagnosis Date   Allergic rhinitis    Anemia    Aortic stenosis    Arthritis    Basal cell carcinoma 11/01/2019    bcc left chest treatment [place] cx3 37fu    Below-knee amputation of right lower extremity (HCC) 12/12/2020   CHF (congestive heart failure) (HCC)    Chronic leg pain    right   Chronic lower back pain    Coronary artery disease    a. Stenting to RCA 2004; staged DES to LAD and Cx 2004. DES to [facility] 2012. b. DES to mCx, PTCA to dCx 11/2011. c. Lateral wall MI 2013 s/p PTCA to distal Cx & DES to mid OM2 11/2011. d. Low risk nuc 04/2014, EF wnl.   COVID-19    Dehiscence of amputation stump (HCC)    Diabetes mellitus    Insulin  dependent   Diabetic neuropathy (HCC)    MILD   Diverticulosis    Dysrhythmia    Edema of both lower extremities 07/05/2023   Fecal impaction (HCC) 02/01/2021   Bertrum syndrome    Gout    right wrist; right foot; right elbow; have had it since 1970's   H/O hiatal hernia    Heart murmur    Hip dislocation, right (HCC) 04/13/2019   History of echocardiogram    aortic sclerosis per echo 12/09 EF 65%, otherwise normal    History of hemorrhoids    BLEEDING   History of kidney stones    h/o   Hypertension    Diagnosed 1995    Myocardial infarction [facility] [place])    Pancreatic pseudocyst    a. s/p remote drainage 2006.   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 He was admitted for failure to thrive and also found to have large pleural effusion. No anticoagulation prior to admission. Pharmacy consulted for heparin .    Heparin  level 0.30 is therapeutic with heparin  running at 1050 units/hr. Hgb (9.5) and PLTs (70) are stable. Per RN, no report of pauses, issues  with the line, or signs of bleeding.   Goal of Therapy:  Heparin  level 0.3-0.7 units/ml Monitor platelets by anticoagulation protocol: Yes   Plan:  Continue heparin  infusion at 1050 units/hr Monitor daily heparin  level, CBC, signs/symptoms of bleeding    Thank you for allowing pharmacy to be a part of this patients care.   Nidia Schaffer, PharmD, BCPS PGY2 Cardiology Pharmacy Resident  Please check AMION for all [facility] Pharmacy phone numbers After 10:00 PM, call Main Pharmacy [phone]     [1]  Allergies Allergen Reactions   Zetia [Ezetimibe] Other (See Comments)    Myalgias    Zocor [Simvastatin] Other (See Comments)    Myalgias     Dilaudid  [Hydromorphone  Hcl] Other (See Comments)    Hallucinations    Entresto  [Sacubitril -Valsartan ] Other (See Comments)    Discontinued by MD due to persistent hypotension    Imdur  [Isosorbide  Nitrate] Other (See Comments)    Discontinued by MD due to persistent hypotension with isosorbide  ER 60mg .   Toprol   Xl [Metoprolol ] Other (See Comments)    Discontinued by MD due to persistent hypotension at 12.5mg 

## 2024-11-08 NOTE — Progress Notes (Addendum)
 Nutrition Brief Note  Pt receiving ultrasound on assessment, unable to visit with pt. Yesterday, speech conducted bedside swallow eval and cleared pt for a regular diet with thin liquids as tolerated. Pt started on CLD as pt has had poor intake for a while as he had had emesis with po intake. Per MD, pt still with abdominal discomfort. No noted emesis. Discussed diet advancement with MD, ok to advance to FLD. Palliative care meeting likely today.   INTERVENTION:  Encourage po intake Advance to FLD, MD agreeable  Diet advancement as pt tolerates per MD Ensure Plus High Protein po BID, each supplement provides 350 kcal and 20 grams of protein Magic cup TID with meals, each supplement provides 290 kcal and 9 grams of protein MVI with minerals daily 100 mg Thiamine  daily Monitor for diet advancement and augment with nutrition supplements as medically appropriate.    NUTRITION DIAGNOSIS:  Severe Malnutrition related to chronic illness (CHF, prolonged inadequate oral intake) as evidenced by severe fat depletion, severe muscle depletion.   GOAL:  Patient will meet greater than or equal to 90% of their needs   MONITOR:  Diet advancement, Labs, Weight trends  Olivia Kenning, RD Registered Dietitian  See Amion for more information

## 2024-11-08 NOTE — Progress Notes (Signed)
 Mesenteric doppler has been completed.  Results can be found in chart review under CV Proc.  11/08/2024 1:27 PM  Edilia Elden Appl, RVT.

## 2024-11-08 NOTE — Progress Notes (Signed)
 OT Cancellation Note  Patient Details Name: Keith Espinoza MRN: [id number] DOB: 08/30/1941   Cancelled Treatment:    Reason Eval/Treat Not Completed: Patient at procedure or test/ unavailable;Other (comment) Echo at bedside. & Per nursing staff, pt with dizziness and vomiting this AM. Noted pending palliative consult for GOC discussions.  Mliss Fish 11/08/2024, 9:34 AM

## 2024-11-09 LAB — HEPARIN LEVEL (UNFRACTIONATED)
Heparin Unfractionated: 0.15 [IU]/mL — ABNORMAL LOW (ref 0.30–0.70)
Heparin Unfractionated: 0.39 [IU]/mL (ref 0.30–0.70)

## 2024-11-09 LAB — BASIC METABOLIC PANEL WITH GFR
Anion gap: 13 (ref 5–15)
Anion gap: 16 — ABNORMAL HIGH (ref 5–15)
BUN: 41 mg/dL — ABNORMAL HIGH (ref 8–23)
BUN: 43 mg/dL — ABNORMAL HIGH (ref 8–23)
CO2: 24 mmol/L (ref 22–32)
CO2: 22 mmol/L (ref 22–32)
Calcium: 8.8 mg/dL — ABNORMAL LOW (ref 8.9–10.3)
Calcium: 8.7 mg/dL — ABNORMAL LOW (ref 8.9–10.3)
Chloride: 99 mmol/L (ref 98–111)
Chloride: 96 mmol/L — ABNORMAL LOW (ref 98–111)
Creatinine, Ser: 2.22 mg/dL — ABNORMAL HIGH (ref 0.61–1.24)
Creatinine, Ser: 2.43 mg/dL — ABNORMAL HIGH (ref 0.61–1.24)
GFR, Estimated: 29 mL/min — ABNORMAL LOW
GFR, Estimated: 26 mL/min — ABNORMAL LOW
Glucose, Bld: 138 mg/dL — ABNORMAL HIGH (ref 70–99)
Glucose, Bld: 202 mg/dL — ABNORMAL HIGH (ref 70–99)
Potassium: 4.1 mmol/L (ref 3.5–5.1)
Potassium: 4.2 mmol/L (ref 3.5–5.1)
Sodium: 136 mmol/L (ref 135–145)
Sodium: 134 mmol/L — ABNORMAL LOW (ref 135–145)

## 2024-11-09 LAB — CBC
HCT: 30.9 % — ABNORMAL LOW (ref 39.0–52.0)
Hemoglobin: 9.6 g/dL — ABNORMAL LOW (ref 13.0–17.0)
MCH: 30.1 pg (ref 26.0–34.0)
MCHC: 31.1 g/dL (ref 30.0–36.0)
MCV: 96.9 fL (ref 80.0–100.0)
Platelets: 104 10*3/uL — ABNORMAL LOW (ref 150–400)
RBC: 3.19 MIL/uL — ABNORMAL LOW (ref 4.22–5.81)
RDW: 17.9 % — ABNORMAL HIGH (ref 11.5–15.5)
WBC: 7.2 10*3/uL (ref 4.0–10.5)
nRBC: 0 % (ref 0.0–0.2)

## 2024-11-09 LAB — CULTURE, BLOOD (ROUTINE X 2)
Culture: NO GROWTH
Culture: NO GROWTH
Special Requests: ADEQUATE

## 2024-11-09 LAB — GLUCOSE, CAPILLARY
Glucose-Capillary: 195 mg/dL — ABNORMAL HIGH (ref 70–99)
Glucose-Capillary: 139 mg/dL — ABNORMAL HIGH (ref 70–99)
Glucose-Capillary: 170 mg/dL — ABNORMAL HIGH (ref 70–99)
Glucose-Capillary: 152 mg/dL — ABNORMAL HIGH (ref 70–99)
Glucose-Capillary: 125 mg/dL — ABNORMAL HIGH (ref 70–99)
Glucose-Capillary: 92 mg/dL (ref 70–99)

## 2024-11-09 LAB — BODY FLUID CULTURE W GRAM STAIN
Culture: NO GROWTH
Gram Stain: NONE SEEN

## 2024-11-09 LAB — COOXEMETRY PANEL
Carboxyhemoglobin: 0.3 % — ABNORMAL LOW (ref 0.5–1.5)
Methemoglobin: 1.8 % — ABNORMAL HIGH (ref 0.0–1.5)
O2 Saturation: 49.5 %
Total hemoglobin: 9.9 g/dL — ABNORMAL LOW (ref 12.0–16.0)

## 2024-11-09 MED ORDER — ALUM & MAG HYDROXIDE-SIMETH 200-200-20 MG/5ML PO SUSP: 30.0000 mL | ORAL | Status: AC | PRN

## 2024-11-09 MED ORDER — ALUM & MAG HYDROXIDE-SIMETH 200-200-20 MG/5ML PO SUSP
30.0000 mL | Freq: Once | ORAL | Status: AC
  Administered 2024-11-09: 30 mL via ORAL
  Filled 2024-11-09: qty 30

## 2024-11-09 MED ORDER — FUROSEMIDE 10 MG/ML IJ SOLN
160.0000 mg | Freq: Once | INTRAVENOUS | Status: AC
  Administered 2024-11-09: 160 mg via INTRAVENOUS
  Filled 2024-11-09: qty 4

## 2024-11-09 MED ORDER — HYOSCYAMINE SULFATE 0.125 MG SL SUBL
0.2500 mg | SUBLINGUAL_TABLET | Freq: Once | SUBLINGUAL | Status: AC
  Administered 2024-11-09: 0.25 mg via SUBLINGUAL
  Filled 2024-11-09: qty 2

## 2024-11-09 NOTE — Progress Notes (Signed)
 PHARMACY - ANTICOAGULATION CONSULT NOTE  Pharmacy Consult for heparin  Indication: possible RV thrombus  Allergies[1]  Patient Measurements: Height: 5' 9 (175.3 cm) Weight: 68.1 kg (150 lb 2.1 oz) IBW/kg (Calculated) : 70.7 HEPARIN  DW (KG): 68.1  Vital Signs: Temp: 97.4 F (36.3 C) (02/06 0300) Temp Source: Oral (02/06 0301) BP: 81/60 (02/06 0300) Pulse Rate: 71 (02/06 0300)  Labs: Recent Labs    11/06/24 1137 11/06/24 1145 11/07/24 0246 11/07/24 1912 11/08/24 0637 11/09/24 0315  HGB 9.7*   < > 10.5*  --  9.5* 9.6*  HCT 30.7*   < > 34.3*  --  30.8* 30.9*  PLT 91*  --  99*  --  70* 104*  LABPROT 20.1*  --   --   --   --   --   INR 1.6*  --   --   --   --   --   HEPARINUNFRC  --   --   --   --  0.30  --   CREATININE 1.97*   < > 1.89* 1.87* 1.84* 2.22*   < > = values in this interval not displayed.    Estimated Creatinine Clearance: 23.9 mL/min (A) (by C-G formula based on SCr of 2.22 mg/dL (H)).   Medical History: Past Medical History:  Diagnosis Date   Allergic rhinitis    Anemia    Aortic stenosis    Arthritis    Basal cell carcinoma 11/01/2019    bcc left chest treatment [place] cx3 38fu    Below-knee amputation of right lower extremity (HCC) 12/12/2020   CHF (congestive heart failure) (HCC)    Chronic leg pain    right   Chronic lower back pain    Coronary artery disease    a. Stenting to RCA 2004; staged DES to LAD and Cx 2004. DES to [facility] 2012. b. DES to mCx, PTCA to dCx 11/2011. c. Lateral wall MI 2013 s/p PTCA to distal Cx & DES to mid OM2 11/2011. d. Low risk nuc 04/2014, EF wnl.   COVID-19    Dehiscence of amputation stump (HCC)    Diabetes mellitus    Insulin  dependent   Diabetic neuropathy (HCC)    MILD   Diverticulosis    Dysrhythmia    Edema of both lower extremities 07/05/2023   Fecal impaction (HCC) 02/01/2021   Bertrum syndrome    Gout    right wrist; right foot; right elbow; have had it since 1970's   H/O hiatal hernia    Heart murmur     Hip dislocation, right (HCC) 04/13/2019   History of echocardiogram    aortic sclerosis per echo 12/09 EF 65%, otherwise normal   History of hemorrhoids    BLEEDING   History of kidney stones    h/o   Hypertension    Diagnosed 1995    Myocardial infarction [facility])    Pancreatic pseudocyst    a. s/p remote drainage 2006.   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 He was admitted for failure to thrive and also found to  have large pleural effusion. No anticoagulation prior to admission. Pharmacy consulted for heparin .    Heparin  level 0.15 is subtherapeutic with heparin  running at 1050 units/hr. Hgb (9.6) and PLTs (104) are stable. No signs of bleeding at bedside, per RN no pauses in the infusion overnight.   Goal of Therapy:  Heparin  level 0.3-0.7 units/ml Monitor platelets by anticoagulation protocol: Yes   Plan:  Increase heparin  infusion to 1150 units/hr Check 8 hour heparin  level Monitor daily heparin  level, CBC, signs/symptoms of bleeding    Thank you for allowing pharmacy to be a part of this patients care.   Nidia Schaffer, PharmD, BCPS PGY2 Cardiology Pharmacy Resident  Please check AMION for all [facility] Pharmacy phone numbers After 10:00 PM, call Main Pharmacy [phone]      [1]  Allergies Allergen Reactions   Zetia [Ezetimibe] Other (See Comments)    Myalgias    Zocor [Simvastatin] Other (See Comments)    Myalgias     Dilaudid  [Hydromorphone  Hcl] Other (See Comments)    Hallucinations    Entresto  [Sacubitril -Valsartan ] Other (See Comments)    Discontinued by MD  due to persistent hypotension    Imdur  [Isosorbide  Nitrate] Other (See Comments)    Discontinued by MD due to persistent hypotension with isosorbide  ER 60mg .   Toprol  Xl [Metoprolol ] Other (See Comments)    Discontinued by MD due to persistent hypotension at 12.5mg 

## 2024-11-09 NOTE — Progress Notes (Signed)
 PHARMACY - ANTICOAGULATION CONSULT NOTE  Pharmacy Consult for heparin  Indication: possible RV thrombus  Allergies[1]  Patient Measurements: Height: 5' 9 (175.3 cm) Weight: 68.1 kg (150 lb 2.1 oz) IBW/kg (Calculated) : 70.7 HEPARIN  DW (KG): 68.1  Vital Signs: Temp: 97.7 F (36.5 C) (02/06 1938) Temp Source: Axillary (02/06 1938) BP: 95/65 (02/06 1938) Pulse Rate: 74 (02/06 1938)  Labs: Recent Labs    11/07/24 0246 11/07/24 1912 11/08/24 9362 11/09/24 0315 11/09/24 0903 11/09/24 1712 11/09/24 1958  HGB 10.5*  --  9.5* 9.6*  --   --   --   HCT 34.3*  --  30.8* 30.9*  --   --   --   PLT 99*  --  70* 104*  --   --   --   HEPARINUNFRC  --   --  0.30  --  0.15*  --  0.39  CREATININE 1.89*   < > 1.84* 2.22*  --  2.43*  --    < > = values in this interval not displayed.    Estimated Creatinine Clearance: 21.8 mL/min (A) (by C-G formula based on SCr of 2.43 mg/dL (H)).   Medical History: Past Medical History:  Diagnosis Date   Allergic rhinitis    Anemia    Aortic stenosis    Arthritis    Basal cell carcinoma 11/01/2019    bcc left chest treatment [place] cx3 39fu    Below-knee amputation of right lower extremity (HCC) 12/12/2020   CHF (congestive heart failure) (HCC)    Chronic leg pain    right   Chronic lower back pain    Coronary artery disease    a. Stenting to RCA 2004; staged DES to LAD and Cx 2004. DES to [facility] 2012. b. DES to mCx, PTCA to dCx 11/2011. c. Lateral wall MI 2013 s/p PTCA to distal Cx & DES to mid OM2 11/2011. d. Low risk nuc 04/2014, EF wnl.   COVID-19    Dehiscence of amputation stump (HCC)    Diabetes mellitus    Insulin  dependent   Diabetic neuropathy (HCC)    MILD   Diverticulosis    Dysrhythmia    Edema of both lower extremities 07/05/2023   Fecal impaction (HCC) 02/01/2021   Bertrum syndrome    Gout    right wrist; right foot; right elbow; have had it since 1970's   H/O hiatal hernia    Heart murmur    Hip dislocation, right (HCC)  04/13/2019   History of echocardiogram    aortic sclerosis per echo 12/09 EF 65%, otherwise normal   History of hemorrhoids    BLEEDING   History of kidney stones    h/o   Hypertension    Diagnosed 1995    Myocardial infarction [facility])    Pancreatic pseudocyst    a. s/p remote drainage 2006.   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 He was admitted for failure to thrive and also found to have large pleural effusion. No anticoagulation prior to admission. Pharmacy consulted for heparin .    Heparin   level 0.39 is therapeutic on 1150 units/hr. Heparin  is running in L PIV, level drawn from R arm PICC.    Goal of Therapy:  Heparin  level 0.3-0.7 units/ml Monitor platelets by anticoagulation protocol: Yes   Plan:  Continue heparin  infusion to 1150 units/hr  Monitor daily heparin  level, CBC, signs/symptoms of bleeding    Thank you for allowing pharmacy to be a part of this patients care.   Nidia Schaffer, PharmD, BCPS PGY2 Cardiology Pharmacy Resident  Please check AMION for all [facility] Pharmacy phone numbers After 10:00 PM, call Main Pharmacy [phone]       [1]  Allergies Allergen Reactions   Zetia [Ezetimibe] Other (See Comments)    Myalgias    Zocor [Simvastatin] Other (See Comments)    Myalgias     Dilaudid  [Hydromorphone  Hcl] Other (See Comments)    Hallucinations    Entresto  [Sacubitril -Valsartan ] Other (See Comments)    Discontinued by MD due to persistent hypotension    Imdur  [Isosorbide  Nitrate] Other (See Comments)    Discontinued by MD due to persistent  hypotension with isosorbide  ER 60mg .   Toprol  Xl [Metoprolol ] Other (See Comments)    Discontinued by MD due to persistent hypotension at 12.5mg 

## 2024-11-09 NOTE — Progress Notes (Signed)
 " PROGRESS NOTE    Keith Espinoza  FMW:[id number] DOB: 1941/07/25 DOA: 11/06/2024 PCP: Charlott Dorn LABOR, MD   Brief Narrative:  Keith Espinoza is a 84 y.o. 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    Patient presents with complaints of nausea vomiting abdominal pain with poor p.o. intake over the past few weeks - worsening over the past few days.  Reports losing weight over the past month due to poor p.o. intake and is now wheelchair-bound -denies dysphagia or odynophagia just poor appetite.  Imaging at intake noted large right lower lobe layering effusion.  Assessment & Plan:   Principal Problem:   Acute on chronic congestive heart failure (HCC) Active Problems:   Loss of appetite   Hyperlipidemia   Type 2 diabetes mellitus with hyperglycemia, with long-term current use of insulin  (HCC)   Chronic heart failure with mildly reduced ejection fraction (HFmrEF, 41-49%) (HCC)   Dysphagia   Coronary artery disease   Paroxysmal A-fib (HCC)   Aspiration pneumonia (HCC)   Moderate sized pleural effusion   Prolonged QT interval   Pleural effusion   Loss of weight   Chronic kidney disease   Cardiogenic shock (HCC)   Thrombus in heart chamber   Abdominal pain, epigastric  Goals of care - Lengthy discussion today with patient wife and daughter at bedside - Patient initially indicated that he just wants to go home  - We discussed that given his current reliance on dobutamine  he would likely not survive transport home at this time but we may be able to wean this medication off over time to improve tolerance of discontinuation - Mesenteric ischemia workup negative, ultrasound notes appropriate  blood flow, discussed with vascular surgery in this imaging is adequate to rule out mesenteric ischemia - Palliative care consulted - follow-up for ongoing goals of care as well as planning for possible disposition given his tenuous state  Chronic heart failure with newly reduced EF from prior Cardiogenic shock with ischemic cardiomyopathy, POA - Echo shows new profoundly reduced EF (<20%) with questionable RV thrombus, severe MR and mod-severe AS.  - Cardiology/HF following -appreciate insight/recs - PICC placed and dobutamine  ongoing- unfortunately appears patient is currently at end stage of the disease process - very little to offer from a cardiac standpoint - Continue symptom management as able  Severe sepsis secondary to aspiration pneumonia, right  Concurrent large right pleural effusion, questionably parapneumonic Acute hypoxic respiratory failure - Severe sepsis criteria met with tachypnea, tachycardia notable source, noted lactic acidosis - Continue azithromycin , ceftriaxone  -tentative stop date 2/7 - IR consulted - status post thoracentesis 2/4, right, 1 L withdrawn -specimen results still pending - High likelihood patient's pneumonia is aspiration and fluid is parapneumonic given its unilateral appearance - Cultures pending, no growth to date - Continue to wean oxygen as appropriate currently on 2 L nasal cannula (baseline on room air)             Intractable abdominal pain  Mesenteric ischemia ruled out Moderate to severe protein caloric malnutrition -Trial GI cocktail today may help delineate between a topical gastritis versus other underlying process given mesenteric ultrasound was unremarkable - Discussed case with GI - appreciate insight and recommendations - Continue dietary supplementation as tolerated, dietary to  follow - Pain initially post prandial -now somewhat chronic/constant - SLP following, advance to full liquid diet -patient now reporting intractable abdominal  pain nausea vomiting even without p.o. intake that wakes him at rest   Chronic anemia of chronic disease -likely exacerbated by malnutrition as above, fecal occult negative Hyperlipidemia- Continue Crestor  10 mg a day DM2 with uncontrolled with hyperglycemia: long-term current use of insulin . Continue sliding scale insulin , hypoglycemic protocol, A1c 8.7  Coronary artery disease Stable continue Crestor  10 mg a day -noncompliant with Plavix  Paroxysmal A-fib (HCC) Not on anticoagulation Prolonged QT interval - History of, continue to follow  DVT prophylaxis: SCDs Start: 11/06/24 2113 Code Status:   Code Status: Limited: Do not attempt resuscitation (DNR) -DNR-LIMITED -Do Not Intubate/DNI  Family Communication: Daughter/grand-daughter at bedside  Status is: Inpatient  Dispo: The patient is from: Home              Anticipated d/c is to: To be determined              Anticipated d/c date is: 24-48 hours              Patient currently not medically stable for discharge  Consultants:  GI, cardiology/heart failure, palliative care  Procedures:  None  Antimicrobials:  None indicated  Subjective: No acute issues or events overnight -continues to have prolific abdominal pain now even without p.o. intake  Objective: Vitals:   11/08/24 2300 11/09/24 0300 11/09/24 0301 11/09/24 0800  BP: 107/75 (!) 81/60  100/69  Pulse: 81 71  73  Resp: 14 (!) 23  (!) 21  Temp: (!) 97.3 F (36.3 C) (!) 97.4 F (36.3 C)  (!) 97.5 F (36.4 C)  TempSrc: Oral Oral Oral Oral  SpO2:    100%  Weight:      Height:        Intake/Output Summary (Last 24 hours) at 11/09/2024 1305 Last data filed at 11/09/2024 0913 Gross per 24 hour  Intake 634.42 ml  Output --  Net 634.42 ml   Filed Weights   11/06/24 1034 11/06/24 1823  Weight: 62.6 kg 68.1 kg    Examination:  General: Cachectic appearing gentleman, pleasantly resting in bed, No acute distress. HEENT:  Normocephalic atraumatic.  Sclerae nonicteric,  noninjected.  Extraocular movements intact bilaterally. Neck:  Without mass or deformity.  Trachea is midline. Lungs:  Clear to auscultate bilaterally without rhonchi, wheeze, or rales. Heart:  Regular rate and rhythm.  Without murmurs, rubs, or gallops. Abdomen:  Soft, tender at the midepigastrium, nondistended.  Without guarding or rebound. Extremities: Without cyanosis, clubbing, edema, or obvious deformity. Skin:  Warm and dry, no erythema.  Data Reviewed: I have personally reviewed following labs and imaging studies  CBC: Recent Labs  Lab 11/06/24 1137 11/06/24 1145 11/07/24 0246 11/08/24 0637 11/09/24 0315  WBC 5.3  --  6.3 7.4 7.2  NEUTROABS 4.2  --   --   --   --   HGB 9.7* 10.9* 10.5* 9.5* 9.6*  HCT 30.7* 32.0* 34.3* 30.8* 30.9*  MCV 93.3  --  97.4 96.6 96.9  PLT 91*  --  99* 70* 104*   Basic Metabolic Panel: Recent Labs  Lab 11/06/24 2105 11/07/24 0246 11/07/24 1912 11/08/24 0637 11/09/24 0315  NA 137 137 138 137 136  K 3.1* 3.0* 3.1* 3.5 4.1  CL 97* 100 99 100 99  CO2 25 21* 23 25 24   GLUCOSE 438* 361* 207* 182* 138*  BUN 43* 42* 42* 41* 41*  CREATININE 1.91* 1.89* 1.87* 1.84* 2.22*  CALCIUM  9.3 9.5 8.8* 8.9 8.8*  MG 1.6* 1.8  --   --   --   PHOS 2.8 2.5  --   --   --    GFR: Estimated Creatinine Clearance: 23.9 mL/min (A) (by C-G formula based on SCr of 2.22 mg/dL (H)). Liver Function Tests: Recent Labs  Lab 11/06/24 1137 11/07/24 0246  AST 21 16  ALT 19 17  ALKPHOS 117 110  BILITOT 1.3* 1.1  PROT 6.4* 6.3*  ALBUMIN  3.5 3.4*   Recent Labs  Lab 11/06/24 1137  LIPASE 23   Recent Labs  Lab 11/06/24 2105  AMMONIA <13   Coagulation Profile: Recent Labs  Lab 11/06/24 1137  INR 1.6*   BNP (last 3 results) Recent Labs    10/05/24 1620 10/25/24 1012 11/06/24 1137  PROBNP 27,900.0* 24,629.0* >35,000.0*   HbA1C: Recent Labs    11/07/24 0246  HGBA1C 8.7*   CBG: Recent Labs  Lab 11/08/24 2101 11/08/24 2311 11/09/24 0341  11/09/24 0903 11/09/24 1201  GLUCAP 123* 151* 195* 139* 170*   Lipid Profile: No results for input(s): CHOL, HDL, LDLCALC, TRIG, CHOLHDL, LDLDIRECT in the last 72 hours. Thyroid  Function Tests: No results for input(s): TSH, T4TOTAL, FREET4, T3FREE, THYROIDAB in the last 72 hours. Anemia Panel: Recent Labs    11/06/24 2105 11/07/24 0246  VITAMINB12  --  >4,000*  FOLATE  --  5.5*  FERRITIN 89  --   TIBC 311  --   IRON  25*  --   RETICCTPCT 2.8  --    Sepsis Labs: Recent Labs  Lab 11/06/24 2105 11/07/24 0019 11/07/24 2130 11/08/24 0825 11/08/24 1356 11/08/24 1928  PROCALCITON 0.25  --   --   --   --   --   LATICACIDVEN 3.3*   < > 2.3* 3.8* 2.5* 2.6*   < > = values in this interval not displayed.    Recent Results (from the past 240 hours)  Blood Culture (routine x 2)     Status: None (Preliminary result)   Collection Time: 11/06/24 11:35 AM   Specimen: BLOOD RIGHT FOREARM  Result Value Ref Range Status   Specimen Description   Final    BLOOD RIGHT FOREARM Performed at [facility], [address] [place], [place] [address]    Special Requests   Final    Blood Culture adequate volume BOTTLES DRAWN AEROBIC AND ANAEROBIC Performed at [facility], [address] [place], [place] [address]    Culture   Final    NO GROWTH 3 DAYS Performed at [facility], [address] [place], [place] [address]    Report Status PENDING  Incomplete  Blood Culture (routine x 2)     Status: None (Preliminary result)   Collection Time: 11/06/24 12:19 PM   Specimen: BLOOD  Result Value Ref Range Status   Specimen Description   Final    BLOOD RIGHT ANTECUBITAL Performed at [facility], [address] [place], [place] [address]    Special Requests   Final    Blood Culture results may not be optimal due to an inadequate volume of blood received in culture bottles BOTTLES DRAWN AEROBIC AND  ANAEROBIC Performed at [facility], [address], [place], [place] [address]    Culture   Final    NO GROWTH 3 DAYS Performed at [facility], [address], [place], [place] [address]    Report  Status PENDING  Incomplete  Body fluid culture w Gram Stain     Status: None (Preliminary result)   Collection Time: 11/07/24  9:17 AM   Specimen: Lung, Right; Pleural Fluid  Result Value Ref Range Status   Specimen Description PLEURAL  Final   Special Requests LUNG,RIGHT  Final   Gram Stain NO WBC SEEN NO ORGANISMS SEEN   Final   Culture   Final    NO GROWTH 2 DAYS Performed at [facility], [address], [place], [place] [address]    Report Status PENDING  Incomplete         Radiology Studies: VAS US  MESENTERIC Result Date: 11/08/2024 ABDOMINAL VISCERAL Patient Name:  Keith Espinoza  Date of Exam:   11/08/2024 Medical Rec #: [id number]               Accession #:    [id number] Date of Birth: [birth year]               Patient Gender: M Patient Age:   45 years Exam Location:  [facility] Procedure:      VAS US  MESENTERIC Referring Phys: [id number] ELSIE JAYSON MONTCLAIR -------------------------------------------------------------------------------- Indications: Insufficiency mesenteric. Limitations: Air/bowel gas. Comparison Study: No prior exam. Performing Technologist: Edilia Elden Appl  Examination Guidelines: A complete evaluation includes B-mode imaging, spectral Doppler, color Doppler, and power Doppler as needed of all accessible portions of each vessel. Bilateral testing is considered an integral part of a complete examination. Limited examinations for reoccurring indications may be performed as noted.  Duplex Findings: +----------------------+--------+--------+------+---------------+ Mesenteric            PSV cm/sEDV cm/sPlaque   Comments     +----------------------+--------+--------+------+---------------+ Aorta Mid                43       14                         +----------------------+--------+--------+------+---------------+ Celiac Artery Origin    113                                 +----------------------+--------+--------+------+---------------+ Celiac Artery Proximal  116      21                         +----------------------+--------+--------+------+---------------+ SMA Origin              146      18                         +----------------------+--------+--------+------+---------------+ SMA Proximal            233      27                         +----------------------+--------+--------+------+---------------+ SMA Mid                 127      22                         +----------------------+--------+--------+------+---------------+ SMA Distal               91      18                         +----------------------+--------+--------+------+---------------+  CHA                                         Not visualized. +----------------------+--------+--------+------+---------------+ Splenic                 104      38                         +----------------------+--------+--------+------+---------------+ IMA                      62      16                         +----------------------+--------+--------+------+---------------+  Mesenteric Technologist observations: Patent SMV. Free fluid noted in the right upper quadrant    Summary: Mesenteric: Normal Celiac artery , Superior Mesenteric artery, Inferior Mesenteric artery and Splenic artery findings. Patent SMV.  *See table(s) above for measurements and observations.  Diagnosing physician: Norman Serve  Electronically signed by Norman Serve on 11/08/2024 at 1:44:08 PM.    Final    DG CHEST PORT 1 VIEW Result Date: 11/07/2024 EXAM: 1 VIEW XRAY OF THE CHEST 11/07/2024 10:43:00 PM COMPARISON: 11/07/2024 CLINICAL HISTORY: Status post PICC central line placement. FINDINGS: LINES, TUBES AND DEVICES: Right-sided PICC line in place  with distal tip at superior cavoatrial junction. LUNGS AND PLEURA: Diffuse interstitial opacities. Right basilar opacity. Small right pleural effusion. No pneumothorax. HEART AND MEDIASTINUM: Aortic atherosclerosis. No acute abnormality of the cardiac silhouette. BONES AND SOFT TISSUES: No acute osseous abnormality. IMPRESSION: 1. Right-sided PICC line with distal tip at the superior cavoatrial junction. 2. Similar right basilar airspace opacities and small right pleural effusion. Electronically signed by: Norman Gatlin MD 11/07/2024 10:51 PM EST RP Workstation: [id number]   US  EKG SITE RITE Result Date: 11/07/2024 If Site Rite image not attached, placement could not be confirmed due to current cardiac rhythm.       Scheduled Meds:  aspirin   81 mg Oral Daily   Chlorhexidine  Gluconate Cloth  6 each Topical Daily   feeding supplement  237 mL Oral BID BM   insulin  aspart  0-9 Units Subcutaneous Q4H   insulin  glargine-yfgn  5 Units Subcutaneous QHS   lidocaine -EPINEPHrine   20 mL Intradermal Once   pantoprazole  (PROTONIX ) IV  40 mg Intravenous Q12H   sodium chloride  flush  10-40 mL Intracatheter Q12H   sodium chloride  flush  3 mL Intravenous Q12H   Continuous Infusions:  amiodarone  30 mg/hr (11/09/24 1212)   cefTRIAXone  (ROCEPHIN )  IV 2 g (11/09/24 1253)   DOBUTamine  7.5 mcg/kg/min (11/08/24 0756)   heparin  1,150 Units/hr (11/09/24 1035)   promethazine  (PHENERGAN ) injection (IM or IVPB) 6.25 mg (11/09/24 1242)     LOS: 3 days   Time spent:  Elsie JAYSON Montclair, DO [facility]  If 7PM-7AM, please contact night-coverage [email or web address]  11/09/2024, 1:05 PM      "

## 2024-11-09 NOTE — Care Management Obs Status (Signed)
 MEDICARE OBSERVATION STATUS NOTIFICATION   Patient Details  Name: Keith Espinoza MRN: [id number] Date of Birth: 08/13/1941   Medicare Observation Status Notification Given:       Vonzell Arrie Sharps 11/09/2024, 11:12 AM

## 2024-11-09 NOTE — Progress Notes (Addendum)
 Patient ID: Keith Espinoza, male   DOB: [birth year], 84 y.o.   MRN: [id number]     Advanced Heart Failure Rounding Note  Chief Complaint: Cardiogenic Shock, End Stage HF, MOSF    Co-ox 35>55 on 2/5; pending this morning. Lactic acid overnight remains elevated 3.8>2.5>2.6 despite DBA 7.5.  Creatinine is climbing 1.84>2.22 with olguria. CVP 17  Discussion this morning with patient. He is actively dying, he would like to go home. He told me that his daughter is setting up his home for this. He is not sleeping, nauseated, and feeling poorly.  Objective:    Weight Range: 68.1 kg Body mass index is 22.17 kg/m.   Vital Signs:   Temp:  [97.3 F (36.3 C)-97.7 F (36.5 C)] 97.4 F (36.3 C) (02/06 0300) Pulse Rate:  [71-111] 71 (02/06 0300) Resp:  [14-23] 23 (02/06 0300) BP: (68-107)/(47-75) 81/60 (02/06 0300) SpO2:  [72 %-100 %] 72 % (02/05 1900) Last BM Date : 11/06/24  Weight change: Filed Weights   11/06/24 1034 11/06/24 1823  Weight: 62.6 kg 68.1 kg   Intake/Output:  Intake/Output Summary (Last 24 hours) at 11/09/2024 0719 Last data filed at 11/08/2024 1154 Gross per 24 hour  Intake 150 ml  Output --  Net 150 ml    Physical Exam   General: Withered appearing. No distress  Cardiac: JVP to jaw. No murmurs  Extremities: Warm and dry.  Trace BLE edema.  Neuro: A&O x3. Affect flat  Telemetry   NSR 70s (personally reviewed)  Labs   CBC Recent Labs    11/06/24 1137 11/06/24 1145 11/08/24 0637 11/09/24 0315  WBC 5.3   < > 7.4 7.2  NEUTROABS 4.2  --   --   --   HGB 9.7*   < > 9.5* 9.6*  HCT 30.7*   < > 30.8* 30.9*  MCV 93.3   < > 96.6 96.9  PLT 91*   < > 70* 104*   < > = values in this interval not displayed.   Basic Metabolic Panel Recent Labs    97/96/73 2105 11/07/24 0246 11/07/24 1912 11/08/24 0637 11/09/24 0315  NA 137 137   < > 137 136  K 3.1* 3.0*   < > 3.5 4.1  CL 97* 100   < > 100 99  CO2 25 21*   < > 25 24  GLUCOSE 438* 361*   < > 182*  138*  BUN 43* 42*   < > 41* 41*  CREATININE 1.91* 1.89*   < > 1.84* 2.22*  CALCIUM  9.3 9.5   < > 8.9 8.8*  MG 1.6* 1.8  --   --   --   PHOS 2.8 2.5  --   --   --    < > = values in this interval not displayed.   Liver Function Tests Recent Labs    11/06/24 1137 11/07/24 0246  AST 21 16  ALT 19 17  ALKPHOS 117 110  BILITOT 1.3* 1.1  PROT 6.4* 6.3*  ALBUMIN  3.5 3.4*   Recent Labs    11/06/24 1137  LIPASE 23   ProBNP (last 3 results) Recent Labs    10/05/24 1620 10/25/24 1012 11/06/24 1137  PROBNP 27,900.0* 24,629.0* >35,000.0*   D-Dimer Recent Labs    11/06/24 1137  DDIMER 1.49*   Hemoglobin A1C Recent Labs    11/07/24 0246  HGBA1C 8.7*   Fasting Lipid Panel No results for input(s): CHOL, HDL, LDLCALC, TRIG, CHOLHDL, LDLDIRECT in the last  72 hours.  Medications:    Scheduled Medications:  aspirin   81 mg Oral Daily   Chlorhexidine  Gluconate Cloth  6 each Topical Daily   feeding supplement  237 mL Oral BID BM   insulin  aspart  0-9 Units Subcutaneous Q4H   insulin  glargine-yfgn  5 Units Subcutaneous QHS   lidocaine -EPINEPHrine   20 mL Intradermal Once   multivitamin with minerals  1 tablet Oral Daily   pantoprazole  (PROTONIX ) IV  40 mg Intravenous Q12H   sodium chloride  flush  10-40 mL Intracatheter Q12H   sodium chloride  flush  3 mL Intravenous Q12H   thiamine   100 mg Oral Daily    Infusions:  amiodarone  30 mg/hr (11/09/24 0056)   cefTRIAXone  (ROCEPHIN )  IV 2 g (11/08/24 1247)   DOBUTamine  7.5 mcg/kg/min (11/08/24 0756)   heparin  1,050 Units/hr (11/08/24 1940)   promethazine  (PHENERGAN ) injection (IM or IVPB) 6.25 mg (11/09/24 0429)    PRN Medications: acetaminophen  **OR** acetaminophen , albuterol , fentaNYL  (SUBLIMAZE ) injection, sodium chloride  flush, sodium chloride  flush  Assessment/Plan   1. Cardiogenic shock: Ischemic cardiomyopathy, End-Stage. Echo this admission reviewed, EF < 20%, severe RV dysfunction (I do not see a  definite RV thrombus), moderate-severe MR, probably mild-moderate AS, dilated IVC.   - BP remain intermittently soft. LA remains elevated despite DBA 7.5. Co-ox is pending this morning, althought with significant bump in creatinine with oliguria, suspect that it is low. Now with multi-organ failure. - CVP 17, not making much urine. Will attempt to diuresis to assist with symptom mgmt, but expect Cr to worsen.  - He is not a candidate for advanced therapies or mechanical support with age, frailty, debility. He is DNR.  He wants to go home.  He is adamant that he does not want hospice as his wife died on hospice and it was a bad experience for him. Does not like the term hospice. Patient and daughter do not want care escalated. On discussion this morning, he understands that he is dying and reports that he is struggling. Still would like to go home. It is possible that we could turn off DBA right before discharging him to home with his daughters, but it is very likely that at that point he would only have hours and suspect that this would be quite uncomfortable for him. Worry that to achieve comfort that this will need to be a hospital death.  - palliative care to see today to assist with planning and conversation with patient and daughter. Appreciate their assistance.  2. CKD stage 3: Suspect cardiorenal syndrome in setting of end stage CHF.  Increasing dobutamine  as above. Oliguric with worsening renal function. Suspect his kidneys are now failing.  3. Atrial fibrillation: He was admitted in AF but he is back in NSR  This so far has not helped him much.   - Contrinue amiodarone  gtt 30 mg/hr.  - Continue heparin  gtt.  4. CAD: Diffuse 3VD on 7/24 cath, no PCI targets and deemed not to be a candidate for CABG.  No chest pain.  - Continue ASA 81.  5. Abdominal discomfort: This may be due to low flow/intestinal ischemia. Will try to maintain BP and CO as well as we can. Continues with nausea and discomfort.   6. ?RV thrombus: I do not see definite RV thrombus on echo.  He will be on heparin  gtt for AF regardless.   Plan as above.   Length of Stay: 3  Jordan Lee, NP  11/09/2024, 7:19 AM  Advanced Heart Failure  Team Pager [phone] (M-F; 7a - 5p)   Please visit [email or web address]: For overnight coverage please call cardiology fellow first. If fellow not available call Shock/ECMO MD on call.  For ECMO / Mechanical Support (Impella, IABP, LVAD) issues call Shock / ECMO MD on call.   Patient seen with NP, I formulated the plan and agree with the above note.   Minimal UOP, CVP up to 17 and creatinine up to 2.22.  Lactate still elevated yesterday pm at 2.6, no co-ox today.  He is on dobutamine  7.5.  He remains in NSR on amiodarone  gtt.    Still with abdominal discomfort and nausea/vomiting.  Mesenteric dopplers unremarkable.   General: NAD, frail.  Neck: JVP 14-16 cm, no thyromegaly or thyroid  nodule.  Lungs: Distant BS CV: Nondisplaced PMI.  Heart regular S1/S2, no S3/S4, no murmur.  1+ left ankle edema.  Abdomen: Soft, nontender, no hepatosplenomegaly, no distention.  Skin: Intact without lesions or rashes.  Neurologic: Alert and oriented x 3.  Psych: Normal affect. Extremities: R BKA.  HEENT: Normal.   AKI in setting of cardiorenal syndrome, significant volume overload with CVP 17.  Will give Lasix  160 mg IV x 1 and follow response.   Patient remains in NSR on amiodarone  gtt and heparin  gtt.   No evidence for mesenteric ischemia on mesenteric dopplers.  Suspect GI symptoms are due to low flow/low cardiac output.   End stage CHF, now failing dobutamine  7.5.  Needs co-ox and lactate this morning.  He realizes that he is dying and wants to go home.  He does not want hospice, he is averse to the term as his wife was on hospice.  I suspect he will not be able to make it home as he will likely decompensate further with stopping dobutamine . I think that this will be an in-hospital death most likely.  I  will have the palliative care service see him (without mentioning the term hospice) to see if it is possible to arrange a way for him to get home with his daughter. If not, would like their help with symptom management at end of life.   Keith Espinoza 11/09/2024 8:20 AM

## 2024-11-09 NOTE — Progress Notes (Signed)
 PT Cancellation Note  Patient Details Name: Keith Espinoza MRN: [id number] DOB: May 21, 1941   Cancelled Treatment:    Reason Eval/Treat Not Completed: Fatigue/lethargy limiting ability to participate;Pain limiting ability to participate. Pt reports being up since 3am vomitting. Currently c/o fatigue and abdominal pain. Declining participation in PT but would like to stay on PT caseload.   Erven Sari Shaker 11/09/2024, 11:32 AM

## 2024-11-09 NOTE — Evaluation (Signed)
 Occupational Therapy Evaluation Patient Details Name: Keith Espinoza MRN: [id number] DOB: Aug 17, 1941 Today's Date: 11/09/2024   History of Present Illness   84 y.o. male presented to [facility] ED on 11/06/24 with complaints of weakness, nausea/vomiting, abdominal pain. Pt was admitted 2/3 for CHF and volume overload with pleural effusion and respiratory failure.  PMH includes R BKA (2022), CAD, CHF, DM, HTN, vertigo.     Clinical Impressions Prior to this admission, patient living at home with use of wheelchair and requiring assistance for ADL management, with HHOT and HHPT at the house. Patient is ill appearing but willing to participate in bed level evaluation. Patient requiring assist for all aspects of care and is total A for ADLs and movement. Patient is adamantly refusing hospice, and is wanting to go home. Patient wants OT to continue to check on patient during this hospital stay, OT recommending return to [facility] if patient chooses. Will also await conversation with palliative care for scope of intervention.      If plan is discharge home, recommend the following:   Two people to help with walking and/or transfers;A lot of help with bathing/dressing/bathroom;Assistance with feeding;Assistance with cooking/housework;Direct supervision/assist for financial management;Direct supervision/assist for medications management;Assist for transportation;Help with stairs or ramp for entrance;Supervision due to cognitive status     Functional Status Assessment   Patient has had a recent decline in their functional status and demonstrates the ability to make significant improvements in function in a reasonable and predictable amount of time.     Equipment Recommendations   None recommended by OT     Recommendations for Other Services         Precautions/Restrictions   Precautions Precautions: Fall Recall of Precautions/Restrictions: Impaired Precaution/Restrictions Comments: h/o R  BKA (prosthetic in room) and wheelchair bound but no wheelchair in room. Required Braces or Orthoses: Other Brace Other Brace: R prosthesis Restrictions Weight Bearing Restrictions Per Provider Order: No     Mobility Bed Mobility Overal bed mobility: Needs Assistance             General bed mobility comments: Pt was mod A needing hand held assist of the bilateral UE to come to EOB    Transfers Overall transfer level: Needs assistance                        Balance Overall balance assessment: Needs assistance                                         ADL either performed or assessed with clinical judgement   ADL Overall ADL's : Needs assistance/impaired Eating/Feeding: Bed level;Set up   Grooming: Wash/dry hands;Wash/dry face;Set up;Bed level   Upper Body Bathing: Minimal assistance;Bed level   Lower Body Bathing: Total assistance;Bed level   Upper Body Dressing : Minimal assistance;Bed level   Lower Body Dressing: Total assistance;Bed level   Toilet Transfer: Total assistance   Toileting- Clothing Manipulation and Hygiene: Total assistance;Bed level       Functional mobility during ADLs: Total assistance;+2 for physical assistance;+2 for safety/equipment       Vision Baseline Vision/History: 1 Wears glasses Ability to See in Adequate Light: 0 Adequate Patient Visual Report: No change from baseline Vision Assessment?: Wears glasses for reading;Wears glasses for driving     Perception Perception: Not tested       Praxis Praxis: Not tested  Pertinent Vitals/Pain Pain Assessment Pain Assessment: Faces Faces Pain Scale: Hurts little more Pain Location: generalized, did not state where Pain Descriptors / Indicators: Discomfort, Grimacing Pain Intervention(s): Limited activity within patient's tolerance, Monitored during session, Repositioned     Extremity/Trunk Assessment Upper Extremity Assessment Upper Extremity  Assessment: Generalized weakness;Right hand dominant   Lower Extremity Assessment Lower Extremity Assessment: Defer to PT evaluation       Communication Communication Communication: Impaired Factors Affecting Communication: Hearing impaired   Cognition Arousal: Alert Behavior During Therapy: WFL for tasks assessed/performed Cognition: Cognition impaired       Memory impairment (select all impairments): Short-term memory     OT - Cognition Comments: minimal STM deficits but oriented                 Following commands: Impaired Following commands impaired: Follows one step commands inconsistently, Follows multi-step commands inconsistently     Cueing  General Comments   Cueing Techniques: Verbal cues;Tactile cues      Exercises     Shoulder Instructions      Home Living Family/patient expects to be discharged to:: Private residence Living Arrangements: Alone Available Help at Discharge: Family;Available PRN/intermittently Type of Home: House Home Access: Level entry     Home Layout: Able to live on main level with bedroom/bathroom     Bathroom Shower/Tub: Walk-in shower   Bathroom Toilet: Handicapped height Bathroom Accessibility: Yes   Home Equipment: Agricultural Consultant (2 wheels);BSC/3in1;Shower seat;Cane - single point;Wheelchair - manual;Wheelchair - power;Hand held shower head;Grab bars - tub/shower;Grab bars - toilet;Adaptive equipment;Lift chair;Other (comment) Adaptive Equipment: Reacher;Sock aid;Long-handled shoe horn;Long-handled sponge Additional Comments: daughter reports pt does not like to use BSC (so not interested in drop-arm [facility]), doesn't like slide boards (per recent prior entry.)      Prior Functioning/Environment Prior Level of Function : Needs assist       Physical Assist : Mobility (physical);ADLs (physical) Mobility (physical): Bed mobility;Transfers;Gait   Mobility Comments: pt and daughter report that pt can perform lateral  scoot to wheelchair; assist for stand pivot transfer to toilet and shower chair; reports wearing RLE prosthetic all of the time. daughter reports pt was ambulating a few months ago, but mobility and desire to move has gone down, pt also not eating. recently finished HHPT ADLs Comments: Pt functions at w/c-level with ADLS and has had some increase in assist with showers from daughter due to weakness    OT Problem List: Decreased strength;Decreased activity tolerance;Decreased safety awareness;Decreased knowledge of use of DME or AE;Decreased range of motion;Impaired balance (sitting and/or standing);Decreased coordination;Decreased cognition;Decreased knowledge of precautions;Cardiopulmonary status limiting activity;Pain   OT Treatment/Interventions: Self-care/ADL training;Therapeutic exercise;Energy conservation;DME and/or AE instruction;Manual therapy;Therapeutic activities;Patient/family education;Balance training;Cognitive remediation/compensation      OT Goals(Current goals can be found in the care plan section)   Acute Rehab OT Goals Patient Stated Goal: to die OT Goal Formulation: With patient Time For Goal Achievement: 11/23/24 Potential to Achieve Goals: Poor ADL Goals Pt Will Perform Eating: with modified independence;sitting Pt Will Perform Grooming: with modified independence;sitting Pt/caregiver will Perform Home Exercise Program: Increased ROM;Increased strength;Both right and left upper extremity;With written HEP provided;With theraband;With minimal assist Additional ADL Goal #1: Patient will be able to transition to EOB at mod A as a precursor to OOB mobility. Additional ADL Goal #2: Patient will be able to sit EOB at min A level for 5 minutes to increase overall activity tolerance.   OT Frequency:  Min 1X/week    Co-evaluation  AM-PAC OT 6 Clicks Daily Activity     Outcome Measure Help from another person eating meals?: A Little Help from another  person taking care of personal grooming?: A Little Help from another person toileting, which includes using toliet, bedpan, or urinal?: Total Help from another person bathing (including washing, rinsing, drying)?: A Lot Help from another person to put on and taking off regular upper body clothing?: A Little Help from another person to put on and taking off regular lower body clothing?: Total 6 Click Score: 13   End of Session Nurse Communication: Mobility status  Activity Tolerance: Patient limited by fatigue;Patient limited by lethargy Patient left: in bed;with call bell/phone within reach;with bed alarm set;with family/visitor present  OT Visit Diagnosis: Unsteadiness on feet (R26.81);Other abnormalities of gait and mobility (R26.89);Muscle weakness (generalized) (M62.81)                Time: 9067-9054 OT Time Calculation (min): 13 min Charges:  OT General Charges $OT Visit: 1 Visit OT Evaluation $OT Eval Low Complexity: 1 Low  Ronal Gift E. Nimesh Riolo, OTR/L Acute Rehabilitation Services [phone]   Ronal Gift Salt 11/09/2024, 1:25 PM

## 2024-11-09 NOTE — Progress Notes (Incomplete)
 "                                                                                                                                                                                                         Palliative Medicine Progress Note   Patient Name: Keith Espinoza       Date: 11/09/2024 DOB: August 21, 1941  Age: 84 y.o. MRN#: [id number] Attending Physician: Lue Elsie BROCKS, MD Primary Care Physician: Charlott Dorn LABOR, MD Admit Date: 11/06/2024  Reason for Consultation/Follow-up: {Reason for Consult:23484}  HPI/Patient Profile: 84 y.o. male  with past medical history of chronic CHF, coronary artery disease, DM2, HTN, HLD, CKD, and right BKA who presented to the ED on 11/06/2024 with nausea/vomiting, abdominal pain, and poor p.o. intake.  He is admitted with acute on chronic heart failure with newly reduced EF from prior, cardiogenic shock, acute respiratory failure, and severe sepsis secondary to aspiration pneumonia.   Palliative Medicine has been consulted for goals of care discussions and complex medical decision making.   Subjective: Chart reviewed, including hospitalist and Heart Failure progress notes from today. Note worsening renal function with creatinine of 2.22 today.   I spoke with daughter/[name] by phone regarding patient's current medical situation and overall poor prognosis.   Objective:  Physical Exam Vitals reviewed.  Constitutional:      General: He is awake. He is not in acute distress.    Appearance: He is ill-appearing.  Cardiovascular:     Rate and Rhythm: Normal rate.  Pulmonary:     Effort: No respiratory distress.  Neurological:     Mental Status: He is oriented to person, place, and time.            Palliative Medicine Assessment & Plan   Assessment: Principal Problem:   Acute on chronic congestive heart failure (HCC) Active Problems:   Hyperlipidemia   Coronary artery disease   Loss of appetite   Type 2 diabetes mellitus with  hyperglycemia, with long-term current use of insulin  (HCC)   Paroxysmal A-fib (HCC)   Chronic heart failure with mildly reduced ejection fraction (HFmrEF, 41-49%) (HCC)   Dysphagia   Aspiration pneumonia (HCC)   Moderate sized pleural effusion   Prolonged QT interval   Pleural effusion   Loss of weight   Chronic kidney disease   Cardiogenic shock (HCC)   Thrombus in heart chamber   Abdominal pain, epigastric    Recommendations/Plan: DNR/DNI Continue current supportive care  Goals of Care and Additional Recommendations: Limitations on Scope of Treatment: {Recommended Scope and Preferences:21019}  Code Status:  Prognosis:  {Palliative Care Prognosis:23504}  Discharge Planning: {Palliative dispostion:23505}  Care plan was discussed with ***  Thank you for allowing the Palliative Medicine Team to assist in the care of this patient.   ***   Recardo KATHEE Loll, NP   Please contact Palliative Medicine Team phone at [phone] for questions and concerns.  For individual providers, please see AMION.      "

## 2024-11-09 NOTE — Progress Notes (Addendum)
 Advanced Heart Failure Progress  Has not made any urine today. Suspect now in acute renal failure 2/2 end stage cardio-renal disease and is now severely oliguric if not anuric. Not a candidate for HD with multiorgan system failure. Continue palliative/comfort care discussions with patient and family. I feel that he has missed his window for home hospice.   - Repeat BMET - Will attempt Lasix  160 x1 again  Keith Gough, NP 11/09/24, 4:27 PM  Advanced Heart Failure Team Pager [phone] (M-F; 7a - 5p)  Please contact [facility] for night-coverage after hours (4p -7a ) and weekends on [email or web address]

## 2024-11-16 ENCOUNTER — Ambulatory Visit: Admitting: Nurse Practitioner

## 2024-12-07 ENCOUNTER — Ambulatory Visit: Admitting: Internal Medicine
